# Patient Record
Sex: Female | Born: 1940
Health system: Southern US, Community
[De-identification: ages and names within clinical notes are randomized; demographics above are authoritative.]

## PROBLEM LIST (undated history)

## (undated) DIAGNOSIS — I739 Peripheral vascular disease, unspecified: Secondary | ICD-10-CM

## (undated) DIAGNOSIS — E669 Obesity, unspecified: Secondary | ICD-10-CM

## (undated) DIAGNOSIS — R0600 Dyspnea, unspecified: Secondary | ICD-10-CM

## (undated) DIAGNOSIS — N183 Chronic kidney disease, stage 3 unspecified: Secondary | ICD-10-CM

## (undated) DIAGNOSIS — Z9981 Dependence on supplemental oxygen: Secondary | ICD-10-CM

## (undated) DIAGNOSIS — G8929 Other chronic pain: Secondary | ICD-10-CM

## (undated) DIAGNOSIS — I771 Stricture of artery: Secondary | ICD-10-CM

## (undated) DIAGNOSIS — M1990 Unspecified osteoarthritis, unspecified site: Secondary | ICD-10-CM

## (undated) DIAGNOSIS — G2581 Restless legs syndrome: Secondary | ICD-10-CM

## (undated) DIAGNOSIS — F419 Anxiety disorder, unspecified: Secondary | ICD-10-CM

## (undated) DIAGNOSIS — G4733 Obstructive sleep apnea (adult) (pediatric): Secondary | ICD-10-CM

## (undated) DIAGNOSIS — F32A Depression, unspecified: Secondary | ICD-10-CM

## (undated) DIAGNOSIS — E039 Hypothyroidism, unspecified: Secondary | ICD-10-CM

## (undated) DIAGNOSIS — J449 Chronic obstructive pulmonary disease, unspecified: Secondary | ICD-10-CM

## (undated) DIAGNOSIS — Z5189 Encounter for other specified aftercare: Secondary | ICD-10-CM

## (undated) DIAGNOSIS — E785 Hyperlipidemia, unspecified: Secondary | ICD-10-CM

## (undated) DIAGNOSIS — I82409 Acute embolism and thrombosis of unspecified deep veins of unspecified lower extremity: Secondary | ICD-10-CM

## (undated) DIAGNOSIS — I779 Disorder of arteries and arterioles, unspecified: Secondary | ICD-10-CM

## (undated) DIAGNOSIS — E559 Vitamin D deficiency, unspecified: Secondary | ICD-10-CM

## (undated) DIAGNOSIS — M109 Gout, unspecified: Secondary | ICD-10-CM

## (undated) DIAGNOSIS — J189 Pneumonia, unspecified organism: Secondary | ICD-10-CM

## (undated) DIAGNOSIS — G4730 Sleep apnea, unspecified: Secondary | ICD-10-CM

## (undated) DIAGNOSIS — I5032 Chronic diastolic (congestive) heart failure: Secondary | ICD-10-CM

## (undated) DIAGNOSIS — I2510 Atherosclerotic heart disease of native coronary artery without angina pectoris: Secondary | ICD-10-CM

## (undated) DIAGNOSIS — I2089 Other forms of angina pectoris: Secondary | ICD-10-CM

## (undated) DIAGNOSIS — K7460 Unspecified cirrhosis of liver: Secondary | ICD-10-CM

## (undated) DIAGNOSIS — M51369 Other intervertebral disc degeneration, lumbar region without mention of lumbar back pain or lower extremity pain: Secondary | ICD-10-CM

## (undated) DIAGNOSIS — M545 Low back pain, unspecified: Secondary | ICD-10-CM

## (undated) DIAGNOSIS — I509 Heart failure, unspecified: Secondary | ICD-10-CM

## (undated) DIAGNOSIS — M2550 Pain in unspecified joint: Secondary | ICD-10-CM

## (undated) DIAGNOSIS — M5136 Other intervertebral disc degeneration, lumbar region: Secondary | ICD-10-CM

## (undated) DIAGNOSIS — D649 Anemia, unspecified: Secondary | ICD-10-CM

## (undated) DIAGNOSIS — I10 Essential (primary) hypertension: Secondary | ICD-10-CM

## (undated) DIAGNOSIS — E876 Hypokalemia: Secondary | ICD-10-CM

## (undated) DIAGNOSIS — R0902 Hypoxemia: Secondary | ICD-10-CM

## (undated) DIAGNOSIS — K219 Gastro-esophageal reflux disease without esophagitis: Secondary | ICD-10-CM

## (undated) DIAGNOSIS — E0590 Thyrotoxicosis, unspecified without thyrotoxic crisis or storm: Secondary | ICD-10-CM

## (undated) DIAGNOSIS — I499 Cardiac arrhythmia, unspecified: Secondary | ICD-10-CM

## (undated) DIAGNOSIS — F329 Major depressive disorder, single episode, unspecified: Secondary | ICD-10-CM

## (undated) DIAGNOSIS — M810 Age-related osteoporosis without current pathological fracture: Secondary | ICD-10-CM

## (undated) DIAGNOSIS — T502X5A Adverse effect of carbonic-anhydrase inhibitors, benzothiadiazides and other diuretics, initial encounter: Secondary | ICD-10-CM

## (undated) DIAGNOSIS — J9610 Chronic respiratory failure, unspecified whether with hypoxia or hypercapnia: Secondary | ICD-10-CM

## (undated) DIAGNOSIS — I208 Other forms of angina pectoris: Secondary | ICD-10-CM

## (undated) HISTORY — DX: Chronic kidney disease, stage 3 unspecified: N18.30

## (undated) HISTORY — DX: Dependence on supplemental oxygen: Z99.81

## (undated) HISTORY — DX: Chronic respiratory failure, unspecified whether with hypoxia or hypercapnia: J96.10

## (undated) HISTORY — DX: Obstructive sleep apnea (adult) (pediatric): G47.33

## (undated) HISTORY — DX: Hypoxemia: R09.02

## (undated) HISTORY — DX: Vitamin D deficiency, unspecified: E55.9

## (undated) HISTORY — DX: Chronic diastolic (congestive) heart failure: I50.32

## (undated) HISTORY — DX: Unspecified osteoarthritis, unspecified site: M19.90

## (undated) HISTORY — DX: Hyperlipidemia, unspecified: E78.5

## (undated) HISTORY — DX: Acute embolism and thrombosis of unspecified deep veins of unspecified lower extremity: I82.409

## (undated) HISTORY — DX: Unspecified cirrhosis of liver: K74.60

## (undated) HISTORY — PX: JOINT REPLACEMENT: SHX530

## (undated) HISTORY — DX: Hypothyroidism, unspecified: E03.9

## (undated) HISTORY — DX: Other intervertebral disc degeneration, lumbar region without mention of lumbar back pain or lower extremity pain: M51.369

## (undated) HISTORY — DX: Pain in unspecified joint: M25.50

## (undated) HISTORY — PX: LAPAROSCOPIC CHOLECYSTECTOMY: SUR755

## (undated) HISTORY — DX: Stricture of artery: I77.1

## (undated) HISTORY — DX: Restless legs syndrome: G25.81

## (undated) HISTORY — DX: Other forms of angina pectoris: I20.8

## (undated) HISTORY — DX: Age-related osteoporosis without current pathological fracture: M81.0

## (undated) HISTORY — DX: Other intervertebral disc degeneration, lumbar region: M51.36

## (undated) HISTORY — DX: Other forms of angina pectoris: I20.89

## (undated) HISTORY — PX: TONSILLECTOMY: SUR1361

## (undated) HISTORY — DX: Obesity, unspecified: E66.9

## (undated) HISTORY — DX: Thyrotoxicosis, unspecified without thyrotoxic crisis or storm: E05.90

## (undated) HISTORY — PX: AUGMENTATION MAMMAPLASTY: SUR837

---

## 1984-12-22 HISTORY — PX: BACK SURGERY: SHX140

## 2002-12-22 HISTORY — PX: GROIN MASS OPEN BIOPSY: SHX1714

## 2002-12-22 HISTORY — PX: ANKLE SURGERY: SHX546

## 2002-12-22 HISTORY — PX: CORONARY ARTERY BYPASS GRAFT: SHX141

## 2003-03-04 ENCOUNTER — Inpatient Hospital Stay (HOSPITAL_COMMUNITY): Admission: AD | Admit: 2003-03-04 | Discharge: 2003-03-14 | Payer: Self-pay | Admitting: Cardiology

## 2003-03-05 ENCOUNTER — Encounter: Payer: Self-pay | Admitting: Cardiology

## 2003-03-09 ENCOUNTER — Encounter: Payer: Self-pay | Admitting: Cardiothoracic Surgery

## 2003-03-10 ENCOUNTER — Encounter: Payer: Self-pay | Admitting: Cardiothoracic Surgery

## 2003-03-11 ENCOUNTER — Encounter: Payer: Self-pay | Admitting: Cardiothoracic Surgery

## 2003-03-12 ENCOUNTER — Encounter: Payer: Self-pay | Admitting: Cardiothoracic Surgery

## 2004-12-22 HISTORY — PX: CATARACT EXTRACTION W/ INTRAOCULAR LENS  IMPLANT, BILATERAL: SHX1307

## 2005-12-22 HISTORY — PX: TOTAL HIP ARTHROPLASTY: SHX124

## 2007-11-22 HISTORY — PX: ANTERIOR CERVICAL DECOMP/DISCECTOMY FUSION: SHX1161

## 2007-12-20 ENCOUNTER — Inpatient Hospital Stay (HOSPITAL_COMMUNITY): Admission: RE | Admit: 2007-12-20 | Discharge: 2007-12-21 | Payer: Self-pay | Admitting: Neurosurgery

## 2007-12-23 DIAGNOSIS — E669 Obesity, unspecified: Secondary | ICD-10-CM

## 2007-12-23 HISTORY — DX: Obesity, unspecified: E66.9

## 2008-03-07 ENCOUNTER — Encounter: Admission: RE | Admit: 2008-03-07 | Discharge: 2008-03-07 | Payer: Self-pay | Admitting: Neurosurgery

## 2008-03-07 ENCOUNTER — Encounter: Payer: Self-pay | Admitting: Internal Medicine

## 2008-04-12 ENCOUNTER — Ambulatory Visit: Payer: Self-pay | Admitting: Internal Medicine

## 2008-04-12 DIAGNOSIS — R0609 Other forms of dyspnea: Secondary | ICD-10-CM | POA: Insufficient documentation

## 2008-04-12 DIAGNOSIS — J449 Chronic obstructive pulmonary disease, unspecified: Secondary | ICD-10-CM | POA: Insufficient documentation

## 2008-04-12 DIAGNOSIS — R0602 Shortness of breath: Secondary | ICD-10-CM | POA: Insufficient documentation

## 2008-04-12 DIAGNOSIS — R0989 Other specified symptoms and signs involving the circulatory and respiratory systems: Secondary | ICD-10-CM | POA: Insufficient documentation

## 2008-04-18 ENCOUNTER — Encounter: Admission: RE | Admit: 2008-04-18 | Discharge: 2008-04-18 | Payer: Self-pay | Admitting: Neurosurgery

## 2008-04-18 ENCOUNTER — Encounter: Payer: Self-pay | Admitting: Internal Medicine

## 2008-04-26 ENCOUNTER — Telehealth: Payer: Self-pay | Admitting: Internal Medicine

## 2008-05-03 ENCOUNTER — Encounter: Payer: Self-pay | Admitting: Internal Medicine

## 2008-05-03 ENCOUNTER — Ambulatory Visit: Payer: Self-pay | Admitting: Pulmonary Disease

## 2008-05-03 DIAGNOSIS — G4730 Sleep apnea, unspecified: Secondary | ICD-10-CM | POA: Insufficient documentation

## 2008-05-03 DIAGNOSIS — J438 Other emphysema: Secondary | ICD-10-CM | POA: Insufficient documentation

## 2008-05-03 DIAGNOSIS — I10 Essential (primary) hypertension: Secondary | ICD-10-CM | POA: Insufficient documentation

## 2008-05-03 DIAGNOSIS — E039 Hypothyroidism, unspecified: Secondary | ICD-10-CM | POA: Insufficient documentation

## 2008-06-09 ENCOUNTER — Ambulatory Visit (HOSPITAL_COMMUNITY): Admission: RE | Admit: 2008-06-09 | Discharge: 2008-06-10 | Payer: Self-pay | Admitting: Neurosurgery

## 2008-07-06 ENCOUNTER — Encounter: Admission: RE | Admit: 2008-07-06 | Discharge: 2008-07-06 | Payer: Self-pay | Admitting: Neurosurgery

## 2008-08-10 ENCOUNTER — Encounter: Admission: RE | Admit: 2008-08-10 | Discharge: 2008-08-10 | Payer: Self-pay | Admitting: Neurosurgery

## 2008-10-09 ENCOUNTER — Inpatient Hospital Stay (HOSPITAL_COMMUNITY): Admission: RE | Admit: 2008-10-09 | Discharge: 2008-10-14 | Payer: Self-pay | Admitting: Neurosurgery

## 2008-11-14 ENCOUNTER — Encounter: Admission: RE | Admit: 2008-11-14 | Discharge: 2008-11-14 | Payer: Self-pay | Admitting: Neurosurgery

## 2008-11-23 ENCOUNTER — Ambulatory Visit: Payer: Self-pay | Admitting: Family Medicine

## 2008-11-23 ENCOUNTER — Inpatient Hospital Stay (HOSPITAL_COMMUNITY): Admission: EM | Admit: 2008-11-23 | Discharge: 2008-11-24 | Payer: Self-pay | Admitting: Emergency Medicine

## 2008-11-23 ENCOUNTER — Encounter (INDEPENDENT_AMBULATORY_CARE_PROVIDER_SITE_OTHER): Payer: Self-pay | Admitting: Emergency Medicine

## 2008-11-23 ENCOUNTER — Ambulatory Visit: Payer: Self-pay | Admitting: Surgery

## 2008-11-23 ENCOUNTER — Encounter: Admission: RE | Admit: 2008-11-23 | Discharge: 2008-11-23 | Payer: Self-pay | Admitting: Neurosurgery

## 2008-12-01 ENCOUNTER — Encounter: Admission: RE | Admit: 2008-12-01 | Discharge: 2008-12-01 | Payer: Self-pay | Admitting: Neurosurgery

## 2008-12-26 ENCOUNTER — Encounter: Admission: RE | Admit: 2008-12-26 | Discharge: 2008-12-26 | Payer: Self-pay | Admitting: Neurosurgery

## 2008-12-27 ENCOUNTER — Telehealth: Payer: Self-pay | Admitting: Family Medicine

## 2008-12-27 DIAGNOSIS — I82409 Acute embolism and thrombosis of unspecified deep veins of unspecified lower extremity: Secondary | ICD-10-CM | POA: Insufficient documentation

## 2009-01-23 ENCOUNTER — Encounter: Payer: Self-pay | Admitting: Neurosurgery

## 2009-02-23 ENCOUNTER — Encounter: Admission: RE | Admit: 2009-02-23 | Discharge: 2009-02-23 | Payer: Self-pay | Admitting: Neurosurgery

## 2009-03-08 ENCOUNTER — Encounter: Admission: RE | Admit: 2009-03-08 | Discharge: 2009-03-08 | Payer: Self-pay | Admitting: Neurosurgery

## 2009-05-29 ENCOUNTER — Encounter: Admission: RE | Admit: 2009-05-29 | Discharge: 2009-05-29 | Payer: Self-pay | Admitting: Neurosurgery

## 2009-08-01 ENCOUNTER — Encounter: Admission: RE | Admit: 2009-08-01 | Discharge: 2009-08-01 | Payer: Self-pay | Admitting: Neurosurgery

## 2009-08-20 ENCOUNTER — Ambulatory Visit: Payer: Self-pay | Admitting: Internal Medicine

## 2009-08-20 ENCOUNTER — Inpatient Hospital Stay (HOSPITAL_COMMUNITY): Admission: RE | Admit: 2009-08-20 | Discharge: 2009-08-23 | Payer: Self-pay | Admitting: Neurosurgery

## 2009-08-22 ENCOUNTER — Encounter (INDEPENDENT_AMBULATORY_CARE_PROVIDER_SITE_OTHER): Payer: Self-pay | Admitting: Neurosurgery

## 2009-11-21 HISTORY — PX: CARDIAC CATHETERIZATION: SHX172

## 2009-11-21 HISTORY — PX: CORONARY ANGIOPLASTY WITH STENT PLACEMENT: SHX49

## 2009-12-22 DIAGNOSIS — Z5189 Encounter for other specified aftercare: Secondary | ICD-10-CM

## 2009-12-22 HISTORY — DX: Encounter for other specified aftercare: Z51.89

## 2010-01-10 ENCOUNTER — Encounter: Admission: RE | Admit: 2010-01-10 | Discharge: 2010-01-10 | Payer: Self-pay | Admitting: Neurosurgery

## 2010-01-14 ENCOUNTER — Encounter: Admission: RE | Admit: 2010-01-14 | Discharge: 2010-01-14 | Payer: Self-pay | Admitting: Neurosurgery

## 2010-07-02 ENCOUNTER — Ambulatory Visit: Payer: Self-pay | Admitting: Cardiovascular Disease

## 2010-07-08 ENCOUNTER — Telehealth: Payer: Self-pay | Admitting: Cardiovascular Disease

## 2010-08-13 ENCOUNTER — Ambulatory Visit: Payer: Self-pay | Admitting: Cardiovascular Disease

## 2010-11-04 ENCOUNTER — Ambulatory Visit: Payer: Self-pay | Admitting: Cardiovascular Disease

## 2010-12-22 DIAGNOSIS — G4730 Sleep apnea, unspecified: Secondary | ICD-10-CM

## 2010-12-22 HISTORY — DX: Sleep apnea, unspecified: G47.30

## 2010-12-22 HISTORY — PX: REPLACEMENT TOTAL KNEE BILATERAL: SUR1225

## 2011-01-16 ENCOUNTER — Ambulatory Visit
Admission: RE | Admit: 2011-01-16 | Discharge: 2011-01-16 | Payer: Self-pay | Source: Home / Self Care | Attending: Cardiovascular Disease | Admitting: Cardiovascular Disease

## 2011-01-17 NOTE — Assessment & Plan Note (Addendum)
[facility]                       [place] CARDIOLOGY OFFICE NOTE  NAME:Klingberg, KALIOPE QUINONEZ                       MRN:          [id number] DATE:01/16/2011                            DOB:          05/18/1941   [name] is a 70 year old female who is here today for a followup visit and regarding preoperative cardiovascular evaluation for knee replacement.  The patient has the following problem list:  1. Coronary artery disease status post coronary artery bypass graft     surgery in 2004 at [facility].  Most recent cardiac     catheterization was done in December 2010, which showed patent left     internal mammary artery graft to left anterior descending coronary     artery, patent saphenous vein graft to obtuse marginal 1, and     occluded saphenous vein graft to first diagonal and ramus.  There     was 90% ostial left main stenosis.  She underwent angioplasty and     drug-eluting stent placement with 4.0 x 12 mm Ion stent.  There was     residual 60-70% proximal stenosis in the circumflex which was left     to be treated medically.  Ejection fraction was 65%. 2. History of deep vein thrombosis in the left leg.  She also had deep     vein thrombosis in the right leg, diagnosed in November 2011.  She     is currently on lifelong anticoagulation with warfarin. 3. Peripheral vascular disease with bilateral moderate carotid artery     stenosis. 4. Chronic obstructive pulmonary disease. 5. Previous tobacco use, quit in 2004. 6. Hypertension. 7. Hyperlipidemia. 8. Severe osteoarthritis.  INTERVAL HISTORY:  Ms. Blow was last seen by me on November 04, 2010. At that time, she was diagnosed with acute DVT in her right popliteal and peroneal veins.  She was started on anticoagulation with subcutaneous Lovenox as well as warfarin.  She has been taking warfarin since then without any complications.  The swelling and discomfort in her right calf  resolved after about 3 weeks of treatment.  She has not had any recurrent swelling in her lower extremities.  She continues to have severe limitations and physical activities due to significant degenerative joint disease in both knees.  This is interfering with her activities of daily living and has significant impact on her quality of life.  She has not had any chest pain, dyspnea, palpitations, or syncope.  She does not have any cardiac symptoms at this time.  MEDICATIONS: 1. Imdur 120 mg once daily. 2. Lisinopril 20 mg once daily. 3. Cyclobenzaprine 10 mg 3 times a day. 4. Amlodipine 5 mg once daily. 5. Levothyroxine 150 mcg once daily. 6. Omeprazole 40 mg once daily. 7. Citalopram 20 mg 3 tablets in the morning. 8. Furosemide 40 mg twice daily. 9. Warfarin 5 mg at bedtime. 10.Aspirin 81 mg once daily. 11.Ambien 5 mg at bedtime as needed.  ALLERGIES:  Include PENICILLIN.  She is also intolerant to STATINS due to myalgia.  PHYSICAL EXAMINATION:  GENERAL:  The patient appears to be at her stated age,  in no acute distress. VITAL SIGNS:  Weight is 214.4 pounds, blood pressure is 112/69, pulse is 94, oxygen saturation is 93% on room air. HEENT:  Normocephalic, atraumatic. NECK:  No JVD or carotid bruits. LUNGS:  Clear to auscultation with normal respiratory effort. CARDIOVASCULAR:  Normal PMI.  Normal S1 and S2 with no gallops or murmurs. ABDOMEN:  Benign, nontender, nondistended. EXTREMITIES:  With trace edema bilaterally.  There is no clubbing or cyanosis. SKIN:  Warm and dry with no rash. PSYCHIATRIC:  She is alert and oriented x3 with normal mood and affect.  An electrocardiogram was performed which showed normal sinus rhythm, left anterior fascicular block, and borderline criteria for left ventricular hypertrophy.  There are no significant ST or T-wave changes.  IMPRESSION: 1. Preoperative cardiovascular evaluation for a planned left knee     replacement.  The patient  does have a cardiac history including     coronary artery bypass graft surgery as well as a stent placement     in December 2010.  Since then, the patient has not had any     recurrent symptoms of angina and has been doing well.  Her     electrocardiogram does not show any acute changes.  Thus, I do not     see need for further cardiac testing at this time.  She can undergo     the planned surgery at a low-to-moderate risk from a cardiac     standpoint.  The second issue is that she had acute deep vein     thrombosis on November 04, 2010.  This symptoms of deep vein     thrombosis has resolved.  As I recommended before, 3 months of     anticoagulation would be enough before she can have her knee     replacement.  Thus, I think it would be safe to perform her surgery     after the second half of February 2012.  At that time, warfarin     treatment can be interrupted for 5-7 days as deemed necessary.     Postoperatively, I recommend resuming warfarin if there are no     bleeding issues.  Due to recurrent history of deep vein thromboses,     I recommended lifelong treatment with warfarin.  She will also     require aggressive deep vein thrombosis prophylaxis throughout her     hospital course as per protocol.  Due to her cardiac history and     previous stent, I recommended continuing aspirin 81 mg once daily.     This should not be stopped preoperatively. 2. Coronary artery disease.  She is stable with no symptoms of angina.     We will continue with current medical therapy.  Unfortunately, she     is intolerant to STATINS due to myalgia. 3. History of recurrent deep vein thromboses in the lower extremities.     Lifelong anticoagulation is recommended.  However, as mentioned     above, her anticoagulation can     be interrupted after February 04, 2011 for the planned surgery     without the need for bridging.    Lorine Bears, MD Electronically Signed   MA/MedQ  DD: 01/16/2011  DT:  01/17/2011  Job #: [id number]  cc:   Dr. Halina Maidens Fedder

## 2011-01-21 NOTE — Progress Notes (Signed)
Summary: Home care certification.     New Problems: DEEP VENOUS THROMBOPHLEBITIS (ICD-453.40)   Additional Follow-up for Phone Call Additional follow up Details #2::    rec'd call from Jan with [facility]. she has a POT for this pt & needs it signed. I do not show her as being a pt here. we did see her in hospital. message to Drs. Andria Rhein for clarification Follow-up by: Golden Circle RN,  December 27, 2008 10:09 AM  Additional Follow-up for Phone Call Additional follow up Details #3:: Details for Additional Follow-up Action Taken: Hospital patient 12/3-4 for DVT. Form signed and returned to Kennon Rounds to fax  New Problems: DEEP VENOUS THROMBOPHLEBITIS (ICD-453.40)

## 2011-01-21 NOTE — Assessment & Plan Note (Signed)
Summary: sleep apnea////kp   Visit Type:  Initial Consult Referred by:  Dr. Marchelle Gearing PCP:  Dr. Polly Cobia in [place], [place]  Chief Complaint:  shortness of breath - clearance for surgery - does not sleep well .  History of Present Illness: Initial visit for sleep evaln - also needs surgical clearance for C-spine surgery. Apparently this was not discussed during meeting with Dr. Marchelle Gearing. DOE since CABG 2 yrs ago, desaturatde to 92% on exertion with HR increasing to 140 s/o deconditioning. Spiriva made her mouth dry & lose her taste - hence stopped. ESS 4/24. Loud snoring , they sleep in different rooms. 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 c/o nocturia , takes pm dose of lasix at 3pm, neck pain has been an issue. Frequent arousals, non refreshing sleep, Increased sleep latency - naps on weekends.    History of Present Illness: "years"  What time do you typically go to bed?(between what hours): 11 - 12  How long does it take you to fall asleep? varies  How many times during the night do you wake up? at least every 1 1/2 hours  What time do you get out of bed to start your day? 6 a.m.  Do you drive or operate heavy machinery in your occupation? no  How much has your weight changed (up or down) over the past two years? (in pounds): 45 lbs increased  Have you ever had a sleep study before?  If yes,when and where: no  Do you currently use CPAP ? If so , at what pressure? no  Do you wear oxygen at any time? If yes, how many liters per minute? oxygen at 2L at bedtime    Current Allergies: ! PENICILLIN  Past Medical History:    Reviewed history from 04/12/2008 and no changes required:       CAD x s/p CABG 2004       Hypertension       COPD/Emphysema x dxed by Dr. Sherril Croon in [place] based on PFTs per patient in 2006. Placed on abluterol       Arthralgia NOS       Obesity BMI 33 in 2009       Current Problems:        HYPERTHYROIDISM (ICD-242.90)    HYPERTENSION (ICD-401.9)       EMPHYSEMA (ICD-492.8)       SNORING (ICD-786.09)       C O P D (ICD-496)       SHORTNESS OF BREATH (SOB) (ICD-786.05)         Past Surgical History:    Reviewed history from 04/12/2008 and no changes required:       neck surgery 11/2007. due for repeat neck surgery       Back surgery scheduled for later 2009       heart 4-bypass       breast implant       Right hip replacement x 2007              Uneventful surgeries   Family History:    Reviewed history from 04/12/2008 and no changes required:       cancer-mother-ovarian       cancer-father-lung  Social History:    Reviewed history from 04/12/2008 and no changes required:       married 52 years and lives with husband       3 grown children  retired       International aid/development worker  Current Meds:  CYCLOBENZAPRINE HCL 10 MG  TABS (CYCLOBENZAPRINE HCL) three times a day ISOSORBIDE MONONITRATE CR 60 MG  TB24 (ISOSORBIDE MONONITRATE) once daily LEVOTHROID 137 MCG  TABS (LEVOTHYROXINE SODIUM) once daily PRAVASTATIN SODIUM 80 MG  TABS (PRAVASTATIN SODIUM) at bedtime ALPRAZOLAM 0.5 MG  TABS (ALPRAZOLAM) at bedtime LISINOPRIL 20 MG  TABS (LISINOPRIL) once daily FUROSEMIDE 80 MG  TABS (FUROSEMIDE) two times a day BUDEPRION SR 150 MG  TB12 (BUPROPION HCL) two times a day KLOR-CON M10 10 MEQ  TBCR (POTASSIUM CHLORIDE CRYS CR) once daily AMLODIPINE BESYLATE 5 MG  TABS (AMLODIPINE BESYLATE) once daily CITALOPRAM HYDROBROMIDE 40 MG  TABS (CITALOPRAM HYDROBROMIDE) once daily BAYER ASPIRIN EC LOW DOSE 81 MG  TBEC (ASPIRIN) once daily PROAIR HFA 108 (90 BASE) MCG/ACT  AERS (ALBUTEROL SULFATE) as needed PILOCARPINE HCL 0.5 %  SOLN (PILOCARPINE HCL) two times a day SPIRIVA HANDIHALER 18 MCG  CAPS (TIOTROPIUM BROMIDE MONOHYDRATE) one puffs in handihaler daily    Review of Systems       The patient complains of dyspnea on exhertion.  The patient denies anorexia, fever, weight loss, weight gain, vision loss,  decreased hearing, hoarseness, chest pain, syncope, peripheral edema, prolonged cough, hemoptysis, abdominal pain, melena, hematochezia, severe indigestion/heartburn, hematuria, incontinence, genital sores, muscle weakness, suspicious skin lesions, transient blindness, difficulty walking, depression, unusual weight change, abnormal bleeding, enlarged lymph nodes, angioedema, breast masses, and testicular masses.     Vital Signs:  Patient Profile:   70 Years Old Female Height:     69 inches Weight:      220.50 pounds O2 Sat:      95 % O2 treatment:    Room Air Pulse rate:   90 / minute BP sitting:   118 / 58  (left arm)  Vitals Entered By: Marinus Maw (May 03, 2008 2:02 PM)             Comments Medications reviewed with patient Marinus Maw  May 03, 2008 2:05 PM      Physical Exam  General:     HEENT - no thrush, no post nasal drip No JVD, no lymphadenopathy CVS- s1s2 nml, no murmur RS- clear, no crackles or rhonchi Abd- soft, non-tender, no organomegaly CNS- non focal Ext - no edema  Mouth:     Melampatti Class III.       Impression & Recommendations:  Problem # 1:  C O P D (ICD-496) Spirometry showed mild airway obstruction FEV1 85% , ratio 67, smaller airways 44% c/w remote smoking. Do not feel the need for spiriva here. she will use albuterol on  as needed basis - no contra-indication to cervical spine surgery.    Problem # 2:  SHORTNESS OF BREATH (SOB) (ICD-786.05) Main reason appears to e severe deconditioning & she would certainl benefit from an exercise / rehab program either before or after surgery (dependig o th eurgency of this surgery)  Problem # 3:  OBSTRUCTIVE SLEEP APNEA (ICD-780.57) Likley based on non refreshing sleep, loud snoring & pharyngeal exam. Will pursue sleep study in future. Would use CPAP empirically when she is waking up form anesthesia & during hospital stay with nasal mask at 10cm. The pathophysiology of obstructive  sleep apnea, it's cardiovascular consequences and modes of treatment including CPAP were discussed with the patient in great detail.    Patient Instructions: 1)  Please schedule a follow-up appointment in 2 months. 2)  OK to proceed with  neck surgery - we will send letter to Dr. Wynetta Emery. 3)  Call us after recovered from surgery to schedule sleep study   ]

## 2011-01-21 NOTE — Consult Note (Signed)
Summary: Office Note/[facility]  Office Note/[facility]   Imported By: Briant Cedar 05/08/2008 10:35:43  _____________________________________________________________________  External Attachment:    Type:   Image     Comment:   External Document

## 2011-01-21 NOTE — Assessment & Plan Note (Signed)
Summary: BREATHING PROBLEM/ MBW   Visit Type:  Initial Consult Referred by:  self referred PCP:  Dr. Polly Cobia in [place], [place]  Chief Complaint:  pulmonary consult.........SOB w/ exertion....Marland Kitchenreviewed meds......  History of Present Illness: 70 year old female c/p dyspnea.  #Dyspnea since CABG in 2004. Noticed dyspnea within 2 weeks of CA BG. Progressive since onset; more progressive since 1 year. Brought on by exertion. Relieved by rest. Activities such as walking around the house, climbing 1 flight of stairs, 50 feet, changing clothes, taking shower make her dyspneic.   Prior to CABG did not have dyspnea but had chest pain. Workup then showed CAD and then underwent CABG at [facility] in 2004 by Dr. Morton Peters. She did NOT attend cardiac rehab post CABG but interestingly has not had chest pain post CABG. Also, in 2006 was diagnosed to have COPD and placed on albuerol prn but this has not helped with dyspnea. Has had neck surgery in 2008 and hip surgery 2007 under [place] without problems. And, as recently as 01/2008 was told by her local cardiologist that dyspnea did not emanate from heart issues  #Associated wheezing with dyspnea present for same time. Associated orthopnea also present x 4 months (sleeps in recliner)  #No wheeze during rest. No chest pain. No hemoptysis. No edema. No PND. No cough.     Updated Prior Medication List: CYCLOBENZAPRINE HCL 10 MG  TABS (CYCLOBENZAPRINE HCL) three times a day ISOSORBIDE MONONITRATE CR 60 MG  TB24 (ISOSORBIDE MONONITRATE) once daily LEVOTHROID 137 MCG  TABS (LEVOTHYROXINE SODIUM) once daily PRAVASTATIN SODIUM 80 MG  TABS (PRAVASTATIN SODIUM) at bedtime ALPRAZOLAM 0.5 MG  TABS (ALPRAZOLAM) at bedtime LISINOPRIL 20 MG  TABS (LISINOPRIL) once daily FUROSEMIDE 80 MG  TABS (FUROSEMIDE) two times a day BUDEPRION SR 150 MG  TB12 (BUPROPION HCL) two times a day KLOR-CON M10 10 MEQ  TBCR (POTASSIUM CHLORIDE CRYS CR) once daily AMLODIPINE BESYLATE 5 MG   TABS (AMLODIPINE BESYLATE) once daily CITALOPRAM HYDROBROMIDE 40 MG  TABS (CITALOPRAM HYDROBROMIDE) once daily BAYER ASPIRIN EC LOW DOSE 81 MG  TBEC (ASPIRIN) once daily PROAIR HFA 108 (90 BASE) MCG/ACT  AERS (ALBUTEROL SULFATE) as needed PILOCARPINE HCL 0.5 %  SOLN (PILOCARPINE HCL) two times a day  Current Allergies (reviewed today): ! PENICILLIN  Past Medical History:    CAD x s/p CABG 2004    Hypertension    COPD/Emphysema x dxed by Dr. Sherril Croon in [place] based on PFTs per patient in 2006. Placed on abluterol    Arthralgia NOS    Obesity BMI 33 in 2009    Hypothyroidisom  Past Surgical History:    neck surgery 11/2007. due for repeat neck surgery    Backk surgery scheduled for later 2009    heart 4-bypass    breast implant    Right hip replacement x 2007        Uneventful surgeries   Family History:    cancer-mother-ovarian    cancer-father-lung  Social History:    married 52 years and lives with husband    3 grown children    retired    International aid/development worker   Risk Factors:  Tobacco use:  quit    Year quit:  2004    Pack-years:  45 1 1/2 packs Alcohol use:  no   Review of Systems       Occ bad GERD Clears throat al the time NOS x many years Snores x years Easy Excess Day time somnolence x years  Vital Signs:  Patient Profile:   70 Years Old Female Height:     69 inches Weight:      223 pounds BMI:     33.05 O2 Sat:      95 % O2 treatment:    Room Air Temp:     98.9 degrees F oral Pulse rate:   88 / minute BP sitting:   130 / 72  (left arm) Cuff size:   regular  Pt. in pain?   no  Vitals Entered By: Clarise Cruz Duncan Dull) (April 12, 2008 9:30 AM)                  Physical Exam  General:     well developed, well nourished, in no acute distressobese.   Head:     normocephalic and atraumatic Eyes:     PERRLA/EOM intact; conjunctiva and sclera clear Ears:     TMs intact and clear with normal canals Nose:     no deformity, discharge,  inflammation, or lesions Mouth:     no deformity or lesions. Dentures. Melampatti Class I.   Neck:     no masses, thyromegaly, or abnormal cervical nodes Chest Wall:     no deformities noted Lungs:     clear bilaterally to auscultation and percussion Heart:     regular rate and rhythm, S1, S2 without murmurs, rubs, gallops, or clicks Abdomen:     bowel sounds positive; abdomen soft and non-tender without masses, or organomegaly Msk:     no deformity or scoliosis noted with normal posture Pulses:     pulses normal Extremities:     no clubbing, cyanosis, edema, or deformity noted Neurologic:     CN II-XII grossly intact with normal reflexes, coordination, muscle strength and tone Skin:     intact without lesions or rashes Cervical Nodes:     no significant adenopathy Psych:     alert and cooperative; normal mood and affect; normal attention span and concentration   CXR  Procedure date:  12/20/2007  Findings:      Personally reviewed  linical Data:  70 year-old female, pre-op HNP.   CHEST - 2 VIEW - 12/20/2007:   Findings:  Patient is status-post median sternotomy for CABG.  Heart   size is normal.  Calcified breast implants are noted bilaterally.   Mild atelectasis or scarring at the left base is noted.  The upper   lung fields are clear.   IMPRESSION:   1. Mild scarring or atelectasis at the left base.   2. Status-post CABG.   3. No acute cardiopulmonary disease.   The findings were communicated to Short-Stay at the time of study.    Read By:  Chauncey Fischer,  MD   Released By:  Chauncey Fischer,  MD     Impression & Recommendations:  Problem # 1:  SHORTNESS OF BREATH (SOB) (ICD-786.05) Assessment: New Chronic Dyspnea with Current ET at Class 3. Previous smoker. Dyspnea started after CABG and she did not undergo rehab. Carries underlying diagnosis of COPD but PFT done outside is not available. Also, not on spiriva treatment for COPD and never  attended rehab.  Deconditioning and Obesity likely contributing to dyspnea as well. Walking desat test today - stopped at 3 laps when HR rose to 144 and pox was 92% - suggesting deconditioning  PLAN Start spiriva Get Outside PFTs from [facility] done in 2007 Refer cardio-pulm rehab Orders: New Patient Level IV (11914) Pulse Oximetry, Ambulatory (78295)  Sleep Disorder Referral (Sleep Disorder) Rehabilitation Referral (Rehab)   Problem # 2:  C O P D (ICD-496) Assessment: New Get outside pft's start spiriva Her updated medication list for this problem includes:    Proair Hfa 108 (90 Base) Mcg/act Aers (Albuterol sulfate) .Marland Kitchen... As needed    Spiriva Handihaler 18 Mcg Caps (Tiotropium bromide monohydrate) ..... One puffs in handihaler daily  Orders: New Patient Level IV (16109) Pulse Oximetry, Ambulatory (60454) Sleep Disorder Referral (Sleep Disorder) Rehabilitation Referral (Rehab)   Problem # 3:  SNORING (ICD-786.09) Assessment: New Obsese. Snores. Exscess day time somnolence. Likely Sleep Apnea present  PLAN Refer Dr. Jeannetta Ellis for evaluation  Her updated medication list for this problem includes:    Lisinopril 20 Mg Tabs (Lisinopril) ..... Once daily    Furosemide 80 Mg Tabs (Furosemide) .Marland Kitchen..Marland Kitchen Two times a day    Proair Hfa 108 (90 Base) Mcg/act Aers (Albuterol sulfate) .Marland Kitchen... As needed    Spiriva Handihaler 18 Mcg Caps (Tiotropium bromide monohydrate) ..... One puffs in handihaler daily  Orders: Sleep Disorder Referral (Sleep Disorder) Rehabilitation Referral (Rehab)   Medications Added to Medication List This Visit: 1)  Cyclobenzaprine Hcl 10 Mg Tabs (Cyclobenzaprine hcl) .... Three times a day 2)  Isosorbide Mononitrate Cr 60 Mg Tb24 (Isosorbide mononitrate) .... Once daily 3)  Levothroid 137 Mcg Tabs (Levothyroxine sodium) .... Once daily 4)  Pravastatin Sodium 80 Mg Tabs (Pravastatin sodium) .... At bedtime 5)  Alprazolam 0.5 Mg Tabs (Alprazolam) .... At bedtime 6)   Lisinopril 20 Mg Tabs (Lisinopril) .... Once daily 7)  Furosemide 80 Mg Tabs (Furosemide) .... Two times a day 8)  Budeprion Sr 150 Mg Tb12 (Bupropion hcl) .... Two times a day 9)  Klor-con M10 10 Meq Tbcr (Potassium chloride crys cr) .... Once daily 10)  Amlodipine Besylate 5 Mg Tabs (Amlodipine besylate) .... Once daily 11)  Citalopram Hydrobromide 40 Mg Tabs (Citalopram hydrobromide) .... Once daily 12)  Bayer Aspirin Ec Low Dose 81 Mg Tbec (Aspirin) .... Once daily 13)  Proair Hfa 108 (90 Base) Mcg/act Aers (Albuterol sulfate) .... As needed 14)  Pilocarpine Hcl 0.5 % Soln (Pilocarpine hcl) .... Two times a day 15)  Spiriva Handihaler 18 Mcg Caps (Tiotropium bromide monohydrate) .... One puffs in handihaler daily    Patient Instructions: 1)  1. Please have [facility] fax pulmonary function test report and data to [phone] 2)  2. Pleast take spiriva inhaler as directed 3)  3. Please attend cardiopulm rehab at [facility] or [facility]. Get clearance from your cardiologist first 4)  4. Please visit Dr. Craige Cotta or Dr. Vassie Loll for Sleep [place] Evaluation    Prescriptions: SPIRIVA HANDIHALER 18 MCG  CAPS (TIOTROPIUM BROMIDE MONOHYDRATE) one puffs in handihaler daily  #1 x 6   Entered and Authorized by:   Kalman Shan MD   Signed by:   Kalman Shan MD on 04/12/2008   Method used:   Print then Give to Patient   RxID:   [id number]  ]  Appended Document: BREATHING PROBLEM/ MBW      Serial Vital Signs/Assessments:  Comments: Ambulatory Pulse Oximetry  Resting; HR_84____    02 Sat_95____  Lap1 (185 feet)   HR_114____   02 Sat__94___ Lap2 (185 feet)   HR_126____   02 Sat_93____    Lap3 (185 feet)   HR_144____   02 Sat_92____  ___Test Completed without Difficulty ___Test Stopped due to.............: Test completed with difficulty ..................................................................Marland KitchenClarise Cruz [facility])  April 12, 2008 11:35 AM   By: Lynnae Sandhoff  Ford,CMA (AAMA)      Current Allergies: ! PENICILLIN           ]

## 2011-01-21 NOTE — Medication Information (Signed)
Summary: O2/April Carlyon  O2/[facility]   Imported By: Briant Cedar 05/08/2008 08:44:31  _____________________________________________________________________  External Attachment:    Type:   Image     Comment:   External Document

## 2011-01-21 NOTE — Progress Notes (Signed)
  Phone Note Outgoing Call   Call placed by: Dessie Coma  LPN,  July 08, 2010 3:31 PM Call placed to: Patient Summary of Call: Patient notified per Dr. Kirke Corin, lab results were reviewed.Marland KitchenMarland KitchenNeed to decrease Lasix to 40mg  two times a day and start on Simvastatin 40mg  at bedtime for cholesterol.

## 2011-01-23 ENCOUNTER — Other Ambulatory Visit: Payer: Self-pay | Admitting: Neurosurgery

## 2011-01-23 DIAGNOSIS — M545 Low back pain, unspecified: Secondary | ICD-10-CM

## 2011-01-29 ENCOUNTER — Other Ambulatory Visit: Payer: Self-pay | Admitting: Neurosurgery

## 2011-01-29 ENCOUNTER — Ambulatory Visit
Admission: RE | Admit: 2011-01-29 | Discharge: 2011-01-29 | Disposition: A | Discharge status: Home / Self Care | Payer: Medicare Other | Source: Ambulatory Visit | Attending: Neurosurgery | Admitting: Neurosurgery

## 2011-01-29 DIAGNOSIS — M545 Low back pain, unspecified: Secondary | ICD-10-CM

## 2011-01-29 MED ORDER — GADOBENATE DIMEGLUMINE 529 MG/ML IV SOLN
20.0000 mL | Freq: Once | INTRAVENOUS | Status: AC | PRN
  Administered 2011-01-29: 20 mL via INTRAVENOUS

## 2011-02-06 ENCOUNTER — Ambulatory Visit
Admission: RE | Admit: 2011-02-06 | Discharge: 2011-02-06 | Disposition: A | Discharge status: Home / Self Care | Payer: Medicare Other | Source: Ambulatory Visit | Attending: Neurosurgery | Admitting: Neurosurgery

## 2011-02-06 ENCOUNTER — Other Ambulatory Visit: Payer: Self-pay | Admitting: Neurosurgery

## 2011-02-06 DIAGNOSIS — N289 Disorder of kidney and ureter, unspecified: Secondary | ICD-10-CM

## 2011-02-06 DIAGNOSIS — M549 Dorsalgia, unspecified: Secondary | ICD-10-CM

## 2011-02-06 MED ORDER — IOHEXOL 300 MG/ML  SOLN
125.0000 mL | Freq: Once | INTRAMUSCULAR | Status: AC | PRN
  Administered 2011-02-06: 125 mL via INTRAVENOUS

## 2011-03-06 ENCOUNTER — Ambulatory Visit: Payer: Self-pay | Admitting: Cardiovascular Disease

## 2011-03-14 ENCOUNTER — Encounter (HOSPITAL_COMMUNITY)
Admission: RE | Admit: 2011-03-14 | Discharge: 2011-03-14 | Disposition: A | Discharge status: Home / Self Care | Payer: Medicare Other | Source: Ambulatory Visit | Attending: Neurosurgery | Admitting: Neurosurgery

## 2011-03-14 DIAGNOSIS — Z01818 Encounter for other preprocedural examination: Secondary | ICD-10-CM | POA: Insufficient documentation

## 2011-03-14 LAB — CBC
HCT: 34.6 % — ABNORMAL LOW (ref 36.0–46.0)
Hemoglobin: 11.3 g/dL — ABNORMAL LOW (ref 12.0–15.0)
MCH: 28.8 pg (ref 26.0–34.0)
MCHC: 32.7 g/dL (ref 30.0–36.0)
MCV: 88.3 fL (ref 78.0–100.0)
Platelets: 333 10*3/uL (ref 150–400)
RBC: 3.92 MIL/uL (ref 3.87–5.11)
RDW: 13.6 % (ref 11.5–15.5)
WBC: 9.9 10*3/uL (ref 4.0–10.5)

## 2011-03-14 LAB — APTT: aPTT: 35 seconds (ref 24–37)

## 2011-03-14 LAB — BASIC METABOLIC PANEL
BUN: 19 mg/dL (ref 6–23)
CO2: 32 mEq/L (ref 19–32)
Calcium: 9.3 mg/dL (ref 8.4–10.5)
Chloride: 100 mEq/L (ref 96–112)
Creatinine, Ser: 0.91 mg/dL (ref 0.4–1.2)
GFR calc Af Amer: >60 mL/min (ref >60)
GFR calc non Af Amer: >60 mL/min (ref >60)
Glucose, Bld: 94 mg/dL (ref 70–99)
Potassium: 4.1 mEq/L (ref 3.5–5.1)
Sodium: 140 mEq/L (ref 135–145)

## 2011-03-14 LAB — PROTIME-INR
INR: 1.6 — ABNORMAL HIGH (ref 0.00–1.49)
Prothrombin Time: 19.2 seconds — ABNORMAL HIGH (ref 11.6–15.2)

## 2011-03-14 LAB — SURGICAL PCR SCREEN
MRSA, PCR: NEGATIVE
Staphylococcus aureus: NEGATIVE

## 2011-03-24 ENCOUNTER — Inpatient Hospital Stay (HOSPITAL_COMMUNITY)
Admission: RE | Admit: 2011-03-24 | Discharge: 2011-03-28 | DRG: 460 | Disposition: A | Discharge status: Home / Self Care | Payer: Medicare Other | Source: Ambulatory Visit | Attending: Neurosurgery | Admitting: Neurosurgery

## 2011-03-24 ENCOUNTER — Inpatient Hospital Stay (HOSPITAL_COMMUNITY): Payer: Medicare Other

## 2011-03-24 ENCOUNTER — Other Ambulatory Visit (HOSPITAL_COMMUNITY): Payer: Self-pay | Admitting: Neurosurgery

## 2011-03-24 ENCOUNTER — Ambulatory Visit (HOSPITAL_COMMUNITY)
Admission: RE | Admit: 2011-03-24 | Discharge: 2011-03-24 | Disposition: A | Discharge status: Home / Self Care | Payer: Medicare Other | Source: Ambulatory Visit | Attending: Neurosurgery | Admitting: Neurosurgery

## 2011-03-24 DIAGNOSIS — M545 Low back pain, unspecified: Secondary | ICD-10-CM

## 2011-03-24 DIAGNOSIS — Z951 Presence of aortocoronary bypass graft: Secondary | ICD-10-CM

## 2011-03-24 DIAGNOSIS — M4310 Spondylolisthesis, site unspecified: Secondary | ICD-10-CM | POA: Diagnosis present

## 2011-03-24 DIAGNOSIS — I2510 Atherosclerotic heart disease of native coronary artery without angina pectoris: Secondary | ICD-10-CM | POA: Diagnosis present

## 2011-03-24 DIAGNOSIS — J449 Chronic obstructive pulmonary disease, unspecified: Secondary | ICD-10-CM | POA: Diagnosis present

## 2011-03-24 DIAGNOSIS — Z86718 Personal history of other venous thrombosis and embolism: Secondary | ICD-10-CM

## 2011-03-24 DIAGNOSIS — I739 Peripheral vascular disease, unspecified: Secondary | ICD-10-CM | POA: Diagnosis present

## 2011-03-24 DIAGNOSIS — Z7901 Long term (current) use of anticoagulants: Secondary | ICD-10-CM

## 2011-03-24 DIAGNOSIS — M51379 Other intervertebral disc degeneration, lumbosacral region without mention of lumbar back pain or lower extremity pain: Principal | ICD-10-CM | POA: Diagnosis present

## 2011-03-24 DIAGNOSIS — M5137 Other intervertebral disc degeneration, lumbosacral region: Principal | ICD-10-CM | POA: Diagnosis present

## 2011-03-24 DIAGNOSIS — E039 Hypothyroidism, unspecified: Secondary | ICD-10-CM | POA: Diagnosis present

## 2011-03-24 DIAGNOSIS — J4489 Other specified chronic obstructive pulmonary disease: Secondary | ICD-10-CM | POA: Diagnosis present

## 2011-03-24 HISTORY — DX: Atherosclerotic heart disease of native coronary artery without angina pectoris: I25.10

## 2011-03-24 HISTORY — DX: Chronic obstructive pulmonary disease, unspecified: J44.9

## 2011-03-24 HISTORY — DX: Peripheral vascular disease, unspecified: I73.9

## 2011-03-24 HISTORY — DX: Essential (primary) hypertension: I10

## 2011-03-24 LAB — TYPE AND SCREEN
ABO/RH(D): O POS
Antibody Screen: NEGATIVE

## 2011-03-25 ENCOUNTER — Encounter (HOSPITAL_COMMUNITY): Payer: Self-pay | Admitting: Radiology

## 2011-03-25 ENCOUNTER — Inpatient Hospital Stay (HOSPITAL_COMMUNITY): Payer: Medicare Other

## 2011-03-25 LAB — CBC
HCT: 28 % — ABNORMAL LOW (ref 36.0–46.0)
Hemoglobin: 9.1 g/dL — ABNORMAL LOW (ref 12.0–15.0)
MCH: 29.1 pg (ref 26.0–34.0)
MCHC: 32.5 g/dL (ref 30.0–36.0)
MCV: 89.5 fL (ref 78.0–100.0)
Platelets: 233 10*3/uL (ref 150–400)
RBC: 3.13 MIL/uL — ABNORMAL LOW (ref 3.87–5.11)
RDW: 14.2 % (ref 11.5–15.5)
WBC: 12.2 10*3/uL — ABNORMAL HIGH (ref 4.0–10.5)

## 2011-03-25 MED ORDER — IOHEXOL 300 MG/ML  SOLN
100.0000 mL | Freq: Once | INTRAMUSCULAR | Status: AC | PRN
  Administered 2011-03-25: 100 mL via INTRAVENOUS

## 2011-03-26 LAB — BASIC METABOLIC PANEL
BUN: 4 mg/dL — ABNORMAL LOW (ref 6–23)
BUN: 3 mg/dL — ABNORMAL LOW (ref 6–23)
CO2: 30 mEq/L (ref 19–32)
CO2: 31 mEq/L (ref 19–32)
Calcium: 8.2 mg/dL — ABNORMAL LOW (ref 8.4–10.5)
Calcium: 8.1 mg/dL — ABNORMAL LOW (ref 8.4–10.5)
Chloride: 98 mEq/L (ref 96–112)
Chloride: 98 mEq/L (ref 96–112)
Creatinine, Ser: 0.62 mg/dL (ref 0.4–1.2)
Creatinine, Ser: 0.58 mg/dL (ref 0.4–1.2)
GFR calc Af Amer: >60 mL/min (ref >60)
GFR calc Af Amer: >60 mL/min (ref >60)
GFR calc non Af Amer: >60 mL/min (ref >60)
GFR calc non Af Amer: >60 mL/min (ref >60)
Glucose, Bld: 137 mg/dL — ABNORMAL HIGH (ref 70–99)
Glucose, Bld: 122 mg/dL — ABNORMAL HIGH (ref 70–99)
Potassium: 2.7 mEq/L — CL (ref 3.5–5.1)
Potassium: 3.2 mEq/L — ABNORMAL LOW (ref 3.5–5.1)
Sodium: 135 mEq/L (ref 135–145)
Sodium: 136 mEq/L (ref 135–145)

## 2011-03-27 NOTE — Op Note (Signed)
[name], [name]                ACCOUNT NO.:  [id number]  MEDICAL RECORD NO.:  [id number]           PATIENT TYPE:  O  LOCATION:  XRAY                         FACILITY:  [facility]  PHYSICIAN:  Donalee Citrin, M.D.        DATE OF BIRTH:  Jul 03, 1941  DATE OF PROCEDURE:  03/24/2011 DATE OF DISCHARGE:  03/24/2011                              OPERATIVE REPORT   PREOPERATIVE DIAGNOSES: 1. L1-2 instability with degenerative collapse, lumbar spinal     stenosis, and ruptured herniated nucleus pulposus at L1-2 on the     left. 2. Pseudoarthrosis at L2-3.  PROCEDURE:  Re-exploration of fusion, removal of hardware from L2-L5, decompressive lumbar laminectomy L1-2, posterior lumbar interbody fusion at L1-2 using Telamon PEEK cage packed with locally harvested allograft mixed with Actifuse and tangent allograft wedge, pedicle screw fixation using 6.35 legacy pedicle system from L1-L3, posterolateral arthrodesis L1-L3, and placement of a large Hemovac drain.  SURGEON:  Donalee Citrin, MD  ASSISTANT:  Kathaleen Maser. Pool, MD  ANESTHESIA:  General endotracheal.  HISTORY OF PRESENT ILLNESS:  The patient is a very pleasant 70 year old female who underwent lumbar spinal fixation many years ago.  The patient had progressive back, left leg pain.  Followup imaging showed severe degenerate collapse, instability, kyphosis, and vacuum phenomena in the L1-2 disk as well as enlarged free fragment herniated disk at L1-2 on the left.  CT scan also showed probable pseudoarthrosis at L2-3 with solid fusion at L3-4 and L4-5.  So due to the patient's clinical decline, failure to conservative treatment, MRI and CT findings, the patient is recommended reexploration of fusion, removal of hardware, posterior lumbar interbody fusion L1-2 as well as a redo posterolateral fusion at L2-3.  Risks and benefits of the operation were explained to the patient.  The patient understood and agreed to proceed forward.  The patient was  brought to the OR, was induced general anesthesia, The patient was brought to the OR, was induced general anesthesia, positioned prone on the Wilson frame.  Back was prepped and draped in the usual sterile fashion.  His old incision was opened up and extended cephalad.  Bovie electrocautery was used to dissect through the scar tissue and subperiosteal dissection carried through lamina of L1 and L2. The hardware was dissected out from L2 down L5.  The cross was removed. The nuts removed, the rods removed, and fusion was inspected.  She has solid fusion at L3-L5 but there was kind of a inadequate fusion at L2-3, so the CT had also showed that the L2 screws  and so these were removed and were noted to be hypermobile and loose.  The L3 screws were strong and incompetent, so they were left behind.  After the L2 screws were removed, the posterolateral T-piece had been dissected out at L1, L2, and L3, central decompression was begun at L1.  Complete medial facetectomies were performed dissecting around the scar tissue freeing up both the L1 and L2 nerves.  Aggressive foraminotomies were carriedout unroofing the L1-L3 nerve and gaining access to the lateral margin of disk space.  The large disk herniation was  immediately identified on the left side with a free fragment underneath the L1 root as it migrated superiorly from the disk space.  This was all teased away and removed with pituitary rongeurs.  Disk space was then incised and further fragments removed.  Attention was taken first to pedicle screws and pedicles were small at L1, so we used the 45 x 45 screws.  So using both AP and lateral fluoroscopy, the correct entry point was selected.  High- speed drill was used to drill down the pilot hole.  The awl was used to cannulate the pedicle and the pedicle was probed and tapped with a 4-0 tap, probed again and a 45 x 45 screw was inserted at L1 sequentially bilaterally.  All screws had excellent  purchase and AP and lateral fluoroscopy confirmed good position of screws.  After screws were placed, the L2 screws were also placed with longer screws.  A 65 x 45 has been taken down, 65 x 50 were placed and these screws had excellent purchase in the depths of the placement as well.  Attention was taken to the interbody work.  A size 8 distractor was initially inserted on the left side.  Then the right side disk space was cleaned out and size 9 distractor was inserted.  Then working on the patient's left side, again disk space was cleaned out.  The 9 distractor was removed and placed on the left, working on the right side.  The disk space was cleaned out radically with a 9 distractor.  Size 8 cutter and disc rotating cutter were used to clean up disk space and clear the endplates.  Using a downgoing Epstein curette, this was used to scrape the endplates and a Telamon PEEK cage 8 x 22 mm packed with locally harvested autograft mixed with Actifuse was inserted on the patient's right side.  Then the distractor was removed.  Fluoroscopy confirmed each step on the right and good positioning and placement of the implant.  This was then cleaned out in similar fashion on the left side again using the rotating cutter and chisel.  Epstein curettes were used also to the endplates. BMP was packed anteriorly.  Local autograft was packed centrally and anteriorly and tangent allograft wedge was placed in the left side. Again, fluoroscopy used at each step along the way to confirm placement. After all the interbody work done, the wound was copiously irrigated and hemostasis was maintained.  Aggressive decortication was carried out at T-piece and lateral gutters.  Remainder of BMP and Actifuse and autograft was then packed posterolaterally and then 80-mm rods were placed and tightened anchoring the L2-3.  The L1 screw was compressed against L2 and across the flap.  Then postop fluoroscopy again  confirmed that showed good positioning of the implant.  Large Hemovac drain was placed and the wound was closed in layers with interrupted Vicryl, and skin was closed with running 4-0 subcuticular.  Benzoin and Steri-Strips were applied.  The patient went to recovery room in stable condition. At the end of the case, all sponge, needle, and instrument count were correct.          ______________________________ Donalee Citrin, M.D.     GC/MEDQ  D:  03/24/2011  T:  03/25/2011  Job:  [id number]  Electronically Signed by Donalee Citrin M.D. on 03/27/2011 08:37:07 AM

## 2011-03-28 LAB — BASIC METABOLIC PANEL
BUN: 13 mg/dL (ref 6–23)
CO2: 29 mEq/L (ref 19–32)
Calcium: 8.2 mg/dL — ABNORMAL LOW (ref 8.4–10.5)
Chloride: 100 mEq/L (ref 96–112)
Creatinine, Ser: 0.71 mg/dL (ref 0.4–1.2)
GFR calc Af Amer: >60 mL/min (ref >60)
GFR calc non Af Amer: >60 mL/min (ref >60)
Glucose, Bld: 114 mg/dL — ABNORMAL HIGH (ref 70–99)
Potassium: 3.4 mEq/L — ABNORMAL LOW (ref 3.5–5.1)
Sodium: 136 mEq/L (ref 135–145)

## 2011-03-28 LAB — CARDIAC PANEL(CRET KIN+CKTOT+MB+TROPI)
CK, MB: 4.4 ng/mL — ABNORMAL HIGH (ref 0.3–4.0)
Relative Index: 2.8 — ABNORMAL HIGH (ref 0.0–2.5)
Total CK: 160 U/L (ref 7–177)
Troponin I: 0.26 ng/mL — ABNORMAL HIGH (ref 0.00–0.06)

## 2011-03-28 LAB — GLUCOSE, CAPILLARY: Glucose-Capillary: 116 mg/dL — ABNORMAL HIGH (ref 70–99)

## 2011-03-29 LAB — CARDIAC PANEL(CRET KIN+CKTOT+MB+TROPI)
CK, MB: 5.7 ng/mL — ABNORMAL HIGH (ref 0.3–4.0)
CK, MB: 9 ng/mL — ABNORMAL HIGH (ref 0.3–4.0)
Relative Index: 2.6 — ABNORMAL HIGH (ref 0.0–2.5)
Relative Index: 3.9 — ABNORMAL HIGH (ref 0.0–2.5)
Total CK: 221 U/L — ABNORMAL HIGH (ref 7–177)
Total CK: 233 U/L — ABNORMAL HIGH (ref 7–177)
Troponin I: 0.02 ng/mL (ref 0.00–0.06)
Troponin I: 0.05 ng/mL (ref 0.00–0.06)

## 2011-03-29 LAB — TYPE AND SCREEN
ABO/RH(D): O POS
Antibody Screen: NEGATIVE

## 2011-03-29 LAB — CBC
HCT: 35.4 % — ABNORMAL LOW (ref 36.0–46.0)
Hemoglobin: 11.6 g/dL — ABNORMAL LOW (ref 12.0–15.0)
MCHC: 32.9 g/dL (ref 30.0–36.0)
MCV: 89.8 fL (ref 78.0–100.0)
Platelets: 256 10*3/uL (ref 150–400)
RBC: 3.94 MIL/uL (ref 3.87–5.11)
RDW: 15.4 % (ref 11.5–15.5)
WBC: 8.3 10*3/uL (ref 4.0–10.5)

## 2011-03-29 LAB — BASIC METABOLIC PANEL
BUN: 21 mg/dL (ref 6–23)
CO2: 31 mEq/L (ref 19–32)
Calcium: 9.4 mg/dL (ref 8.4–10.5)
Chloride: 103 mEq/L (ref 96–112)
Creatinine, Ser: 1.18 mg/dL (ref 0.4–1.2)
GFR calc Af Amer: 55 mL/min — ABNORMAL LOW (ref >60)
GFR calc non Af Amer: 45 mL/min — ABNORMAL LOW (ref >60)
Glucose, Bld: 83 mg/dL (ref 70–99)
Potassium: 4.2 mEq/L (ref 3.5–5.1)
Sodium: 141 mEq/L (ref 135–145)

## 2011-03-29 LAB — PROTIME-INR
INR: 1 (ref 0.00–1.49)
Prothrombin Time: 13.1 seconds (ref 11.6–15.2)

## 2011-03-29 LAB — APTT: aPTT: 23 seconds — ABNORMAL LOW (ref 24–37)

## 2011-03-30 ENCOUNTER — Emergency Department (HOSPITAL_COMMUNITY)
Admission: EM | Admit: 2011-03-30 | Discharge: 2011-03-30 | Disposition: A | Discharge status: Home / Self Care | Payer: Medicare Other | Attending: Emergency Medicine | Admitting: Emergency Medicine

## 2011-03-30 ENCOUNTER — Emergency Department (HOSPITAL_COMMUNITY): Payer: Medicare Other

## 2011-03-30 DIAGNOSIS — J449 Chronic obstructive pulmonary disease, unspecified: Secondary | ICD-10-CM | POA: Insufficient documentation

## 2011-03-30 DIAGNOSIS — J4489 Other specified chronic obstructive pulmonary disease: Secondary | ICD-10-CM | POA: Insufficient documentation

## 2011-03-30 DIAGNOSIS — I4460 Unspecified fascicular block: Secondary | ICD-10-CM | POA: Insufficient documentation

## 2011-03-30 DIAGNOSIS — I4949 Other premature depolarization: Secondary | ICD-10-CM | POA: Insufficient documentation

## 2011-03-30 DIAGNOSIS — E7800 Pure hypercholesterolemia, unspecified: Secondary | ICD-10-CM | POA: Insufficient documentation

## 2011-03-30 DIAGNOSIS — G8918 Other acute postprocedural pain: Secondary | ICD-10-CM | POA: Insufficient documentation

## 2011-03-30 DIAGNOSIS — I10 Essential (primary) hypertension: Secondary | ICD-10-CM | POA: Insufficient documentation

## 2011-03-30 DIAGNOSIS — Z951 Presence of aortocoronary bypass graft: Secondary | ICD-10-CM | POA: Insufficient documentation

## 2011-03-30 DIAGNOSIS — Z9889 Other specified postprocedural states: Secondary | ICD-10-CM | POA: Insufficient documentation

## 2011-03-30 DIAGNOSIS — R110 Nausea: Secondary | ICD-10-CM | POA: Insufficient documentation

## 2011-03-30 DIAGNOSIS — E039 Hypothyroidism, unspecified: Secondary | ICD-10-CM | POA: Insufficient documentation

## 2011-03-30 DIAGNOSIS — R109 Unspecified abdominal pain: Secondary | ICD-10-CM | POA: Insufficient documentation

## 2011-03-30 LAB — COMPREHENSIVE METABOLIC PANEL
ALT: 12 U/L (ref 0–35)
AST: 17 U/L (ref 0–37)
Albumin: 2.5 g/dL — ABNORMAL LOW (ref 3.5–5.2)
Alkaline Phosphatase: 73 U/L (ref 39–117)
BUN: 6 mg/dL (ref 6–23)
CO2: 31 mEq/L (ref 19–32)
Calcium: 8.2 mg/dL — ABNORMAL LOW (ref 8.4–10.5)
Chloride: 99 mEq/L (ref 96–112)
Creatinine, Ser: 0.69 mg/dL (ref 0.4–1.2)
GFR calc Af Amer: >60 mL/min (ref >60)
GFR calc non Af Amer: >60 mL/min (ref >60)
Glucose, Bld: 108 mg/dL — ABNORMAL HIGH (ref 70–99)
Potassium: 2.5 mEq/L — CL (ref 3.5–5.1)
Sodium: 137 mEq/L (ref 135–145)
Total Bilirubin: 0.4 mg/dL (ref 0.3–1.2)
Total Protein: 5.1 g/dL — ABNORMAL LOW (ref 6.0–8.3)

## 2011-03-30 LAB — DIFFERENTIAL
Basophils Absolute: 0.1 10*3/uL (ref 0.0–0.1)
Basophils Relative: 1 % (ref 0–1)
Eosinophils Absolute: 0.2 10*3/uL (ref 0.0–0.7)
Eosinophils Relative: 2 % (ref 0–5)
Lymphocytes Relative: 22 % (ref 12–46)
Lymphs Abs: 2 10*3/uL (ref 0.7–4.0)
Monocytes Absolute: 1 10*3/uL (ref 0.1–1.0)
Monocytes Relative: 11 % (ref 3–12)
Neutro Abs: 5.9 10*3/uL (ref 1.7–7.7)
Neutrophils Relative %: 64 % (ref 43–77)

## 2011-03-30 LAB — CBC
HCT: 26.7 % — ABNORMAL LOW (ref 36.0–46.0)
Hemoglobin: 8.8 g/dL — ABNORMAL LOW (ref 12.0–15.0)
MCH: 28.9 pg (ref 26.0–34.0)
MCHC: 33 g/dL (ref 30.0–36.0)
MCV: 87.5 fL (ref 78.0–100.0)
Platelets: 327 10*3/uL (ref 150–400)
RBC: 3.05 MIL/uL — ABNORMAL LOW (ref 3.87–5.11)
RDW: 14.3 % (ref 11.5–15.5)
WBC: 9.1 10*3/uL (ref 4.0–10.5)

## 2011-03-30 LAB — URINALYSIS, ROUTINE W REFLEX MICROSCOPIC
Bilirubin Urine: NEGATIVE
Glucose, UA: NEGATIVE mg/dL
Hgb urine dipstick: NEGATIVE
Ketones, ur: NEGATIVE mg/dL
Nitrite: NEGATIVE
Protein, ur: NEGATIVE mg/dL
Specific Gravity, Urine: 1.015 (ref 1.005–1.030)
Urobilinogen, UA: 1 mg/dL (ref 0.0–1.0)
pH: 6.5 (ref 5.0–8.0)

## 2011-03-30 LAB — LIPASE, BLOOD: Lipase: 14 U/L (ref 11–59)

## 2011-03-30 LAB — PROTIME-INR
INR: 1.06 (ref 0.00–1.49)
Prothrombin Time: 14 seconds (ref 11.6–15.2)

## 2011-04-09 NOTE — Discharge Summary (Signed)
  [name], [name]                ACCOUNT NO.:  [id number]  MEDICAL RECORD NO.:  [id number]           PATIENT TYPE:  I  LOCATION:  3015                         FACILITY:  [facility]  PHYSICIAN:  Donalee Citrin, M.D.        DATE OF BIRTH:  [birth year]  DATE OF ADMISSION:  03/24/2011 DATE OF DISCHARGE:  03/28/2011                              DISCHARGE SUMMARY   ADMITTING DIAGNOSIS:  Degenerative disk disease, spondylolisthesis, L1- 2.  PROCEDURE:  Reexploration of lumbar fusion and removal of hardware, L2- L5; redo posterolateral arthrodesis, L2-3; and posterior lumbar interbody fusion, L1-2.  FINAL DIAGNOSIS:  Degenerative disk disease and spondylolisthesis at L1- 2 and pseudoarthrosis at L2-3.  HOSPITAL COURSE:  The patient was admitted and immediately went to the operating room and underwent the aforementioned procedure. Postoperatively, the patient did very well and recovering on the floor. On the floor, the patient was convalescing well, in the first 24 hours postoperatively  was having a lot of trouble with nausea and abdominal pain.  CT scan obtained showed good hardware and implant position in the surgical field with no evidence of complicating sequelae or no significant retroperitoneal hematoma, bowel injury, or anything to explain her abdominal pain.  This did improve over the next couple of days.  The patient was started on Reglan, it helped with nausea significantly.  She was ambulating well with physical therapy, voiding spontaneously, was able to be discharged home with scheduled followup in approximately a week and half.  She was discharged on p.o. pain medication, as well as muscle relaxers and suppositories for her bowels.          ______________________________ Donalee Citrin, M.D.     GC/MEDQ  D:  03/28/2011  T:  03/28/2011  Job:  [id number]  Electronically Signed by Donalee Citrin M.D. on 04/09/2011 05:22:50 PM

## 2011-05-06 NOTE — Op Note (Signed)
Judy, [name] NO.:  [id number]   MEDICAL RECORD NO.:  [id number]          PATIENT TYPE:  INP   LOCATION:  3003                         FACILITY:  [facility]   PHYSICIAN:  Donalee Citrin, M.D.        DATE OF BIRTH:  1941/12/11   DATE OF PROCEDURE:  08/20/2009  DATE OF DISCHARGE:                               OPERATIVE REPORT   PREOPERATIVE DIAGNOSES:  1. L2-L3 degenerative disk disease.  2. Lumbar spinal stenosis.  3. Degenerative scoliosis.   PROCEDURES:  1. Decompressive laminectomy at L2-L3 and posterior lumbar interbody      fusion at L2-L3 using a hybrid Telamon PEEK cage on one side with a      tangent allograft wedge on the other side.  2. Pedicle screw fixation, L2-L5 using a 6.35 Legacy pedicle screw      system.  3. Re-exploration of fusion.  4. Removal of hardware, L3-L5.  5. Posterolateral arthrodesis, L2-L3 using local autograft mixed with      Actifuse.  6. Open reduction of spinal deformity, L2-L3.  7. Placement of large Hemovac drain.   SURGEON:  Donalee Citrin, MD   ASSISTANT:  Kathaleen Maser. Pool, MD   ANESTHESIA:  General endotracheal.   HISTORY OF PRESENT ILLNESS:  The patient is a very pleasant 70 year old  female who has had previous L3-L5 fusion 10 months ago.  The patient did  very well except over the last several weeks has had progressive  worsening back and predominantly left leg pain, radiating to her hip  into the front of her thigh down to her knee.  MRI scan and CT scan  showed a questionable solid bony fusion of an L3-L5.  However, it showed  severe degenerative collapse at L2-L3, predominantly on the left with a  degenerative scoliosis at this level and severe lumbar spinal stenosis  with central ruptured disk.  The patient failed all forms of  conservative treatment and was recommended re-exploration of fusion,  removal of hardware, decompressive laminectomy at L2-L3, and posterior  lumbar interbody fusion at L2-L3, extending the  construct.  Risks and  benefits of the operation were explained to the patient, she understood  and agreed to proceed forward.  So, the patient was brought in the OR,  was induced under general anesthesia, positioned prone on the Wilson  frame.  Back was prepped and draped in the usual sterile fashion.  Her  old incision was opened up and extended cephalad.  Then, scar tissue and  subperiosteal dissection carried down at lamina of L2-L3 and the  hardware was identified and exposed from L3 down to L5.  Looking at the  construct, it appeared to be solid at L4-L5.  However, there was a small  amount of micromotion at L3-L4, so it was elected to keep the hardware  and screws in place.  At this point, the residual spinous process at L2  was removed.  Central decompression was begun.  There was a marked facet  arthropathy and ligamentous hypertrophy causing severe hourglass  compression of thecal sac at this level.  So after complete central  decompression had been carried up superiorly, the foramina were carried  out laterally.  The L2 root and the L3 root were aggressively  decompressed.  Complete medial facetectomies were performed after the  scar tissue freed up off the inferior border of the 3 pedicle.  The  aggressive underbiting of the superior facet, articulating facet complex  was carried out to gain the lateral margins of the lateral disk space.  The epidural veins were coagulated.  Then, attention was taken to  pedicle screw placement first.  Working on the patient's right side, a  high-speed drill was used to drill a pilot hole at L2 on the right.  This was cannulated with the awl, however, it was felt that I may have  had a medial breach, so the awl was removed.  The hole was waxed.  An  attempt was made at more lateral entry point, however, this ended up  going lateral, so then working between the 2 entry points and a more of  a straight trajectory with left medial trajectory, a  new pilot hole was  drilled and cannulated and this was probed, it was tapped, it was probed  again, and then a 6.5 x 45 screw was inserted at L2 on the right.  Fluoroscopy used at each step along the way as well as external and  internal bony landmarks confirmed good position of that screw and no  medial breach.  Then, the left-sided screw was inserted without  complication and after the screws had been positioned, attention was  taken to the interbody work.  The disk space was incised.  A size 7  distractor was initially inserted to reduce the deformity and the severe  collapse at L2-L3 on the left.  Then working on the right, the disk  space was cleaned out.  A size 8 distractor was inserted, then the size  7 was removed.  The 8 felt to be good sizing for the graft material,  then the disk space was cleaned out with a size 8 cutter and chisel on  the left side.  A tangent allograft was inserted on the left side.  Then  working on the right side, disk space was cleaned out in similar  fashion.  Local autograft was then packed centrally.  There were several  large free fragments.  These were subsequently removed from the left  side and this helped decompress the proximal 3 roots, but after the  central endplates had been prepped, local autograft was packed centrally  and a right-sided Telamon packed with local autograft and Actifuse was  inserted on the right side.  After all the interbody work had been done,  fluoroscopy used at each step along the way confirmed good positioning  of the instruments and the implants.  Wound was copiously irrigated and  meticulous hemostasis was maintained.  AP fluoro was brought in then to  confirm good placement of both screws, especially the one that had the  pilot hole that went lateral.  Then after this confirmed with good  positioning of the screws, aggressive decortication was carried out in T-  piece and lateral gutters.  The local autograft was  then packed  posterolaterally.  Then, rods were inserted.  Top tighteners were  tightened down at L3, L4, and L5.  The 2 screws were compressed against  L3 and a 420 cross-link was inserted.  All the neural foramina  reinspected and noted to be widely patent.  Then, Gelfoam was laid on  top of the dura.  Large Hemovac drain was spaced and the wound was  closed in layers with interrupted Vicryl and the skin was closed with  running 4-0 subcuticular.  Benzoin and Steri-Strip applied.  The patient  went to recovery room in stable condition.  At the end of the case,  sponge and instrument counts were correct.           ______________________________  Donalee Citrin, M.D.     GC/MEDQ  D:  08/20/2009  T:  08/21/2009  Job:  [id number]

## 2011-05-06 NOTE — Discharge Summary (Signed)
NAMEMARGUITA, VENNING NO.:  [id number]   MEDICAL RECORD NO.:  [id number]          PATIENT TYPE:  INP   LOCATION:  3007                         FACILITY:  [facility]   PHYSICIAN:  Donalee Citrin, M.D.        DATE OF BIRTH:  11/10/1941   DATE OF ADMISSION:  10/09/2008  DATE OF DISCHARGE:  10/14/2008                               DISCHARGE SUMMARY   ADMITTING DIAGNOSIS:  Degenerative disk disease L3-4 and L4-5 with grade  1 spondylolisthesis at L4-5.   PROCEDURE DURING THIS HOSPITALIZATION:  L3-4 and L4-5 lumbar fusion.   DISCHARGE DIAGNOSIS:  Degenerative disease lumbar spinal and stenosis L3-  L5.   HISTORY OF PRESENT ILLNESS:  The patient is very pleasant 70 year old  female who has had long-standing back pain and chronic leg pain.  The  patient was admitted to the hospital, underwent the aforementioned  procedure.  Postop, the patient did very well and recovering on the  floor.  On the floor, the patient progressed, mobilizing, physical and  occupational therapy, had significant improvement in her leg pain, had  some difficulty with postoperative back pain and this did resolve over  the next several days; however, she was slow to mobilize.  Her Hemovac  was able to be taken out on hospital day #4, and her Foley was  discontinued at that point as well.  She had an episode of hypotension  and tachycardia.  Labs were checked.  Hematocrit was low at 6.9.  The  patient was transfused 2 units of packed cells.  She responded very well  to this.  The patient was subsequently able to be discharged home on  hospital day 5 in stable condition with follow up in approximately 2  weeks.           ______________________________  Donalee Citrin, M.D.     GC/MEDQ  D:  11/22/2008  T:  11/23/2008  Job:  [id number]

## 2011-05-06 NOTE — Op Note (Signed)
[name], [name] NO.:  [id number]   MEDICAL RECORD NO.:  [id number]          PATIENT TYPE:  OIB   LOCATION:  3538                         FACILITY:  [facility]   PHYSICIAN:  Donalee Citrin, M.D.        DATE OF BIRTH:  February 12, 1941   DATE OF PROCEDURE:  DATE OF DISCHARGE:                               OPERATIVE REPORT   PREOPERATIVE DIAGNOSIS:  Cervical spinal stenosis, C3-C4 and C4-C5 from  cervical spondylosis in these two levels.   PROCEDURE:  Anterior cervical diskectomy and fusion, C3-C4 and C4-C5,  exploration and fusion, and removal of hardware C5 and C7.   ANESTHESIA:  General endotracheal.   HISTORY OF PRESENT ILLNESS:  The patient is a pleasant 70 year old  female who has had progressive worsening neck pain and bilateral  shoulder pain that has been refractory to all forms of conservative  treatment.  The patient underwent anterior cervical diskectomy and  fusion at C5-C6 and C6-C7 back about 4 months ago and pains are  progressing approximately 4-6 weeks postoperatively.  Imaging showed  progressive breakdown at C4-C5 and C3-C4 with spondylosis and mild C3-C4  subluxation as well as collapsing down on top of the C5 and C7 plate  overlying the disk space.   The patient was awaited for adequate fusion at C5 and C7 by CT criteria  and the patient was recommended removal of hardware, exploration and  fusion, and anterior cervical diskectomy and fusion at C3-C4 and C4-C5.  Risks and benefits of the operation were explained to the patient.  She  understood and agreed to proceed forth.   DESCRIPTION OF PROCEDURE:  The patient was brought to the OR, was  induced general anesthesia, positioned supine, after neck flexed  slightly and 5 pounds of Holter tracing. The right side of neck was  prepped and draped in usual sterile fashion. Preoperative x-ray  localized the appropriate level.  A curvilinear incision was made just  off the midline through a portion of the  sternocleidomastoid.  The  superficial layer of the platysma was dissected out and divided  longitudinally.  The avascular plane between sternocleidomastoid and  strap muscle was divided down to the prevertebral fascia.  The  prevertebral fascia was dissected with Kitners.  Intraoperative x-ray  confirmed localization at appropriate level at C3-C4.  Then, the plate  was immediately identified, and the scar tissue was dissected away to  expose the plate from C5 through C7 with combination of Kitners, the  longus colli was reflected laterally.  The plate was then removed and  the fusion was inspected and it appeared to be adequate solid bony  fusion from C5 through C7.  So, this plate was left off.  Then,  work  right above screw holes at C5 and C4-C5 disk space was identified and  cut with a 15-blade scalpel and annulotomy was then extended.  At C3-C4,  the longus colli was reflected laterally.  The upper level self-  retaining rectractor  was placed.  Then, after the annulotomies were  extended, 3 mm Kerrison punches was used to bite  off the anterior  osteophytes coming off the C3 and C4 vertebral bodies.  The disk space  was then drilled down at posterior annulus and osteophytic complexes.  Then, working under the microscope and on the microscopic examination,  the undersurface of the posterior annulus was bitten away with a 1-mm  Kerrison punch exposing C3-C4.  Aggressive underbiting of both C3 and C4  vertebral bodies was carried out.  The TLL was then identified and  removed a piece of fascia to expose the thecal sac.  There was very  marked stenosis coming from ligamentous hypertrophy predominantly as  well as boney structures coming from C3 body, these were all removed.  There is a large amount of uncinate hypertrophy at C3-C4 on the left.  This was all aggressively underbitten and decompressed in the proximal  C4 nerve roots bilaterally.  After adequate decompression centrally had   been obtained, the endplates were scraped, Gelfoam was placed in deep C3  through C4, and in a similar fashion, working at C4-C5, again aggressive  underbitten was bitten out with posterior annulus and posterior ligament  and large osteophyte coming from the C4 and C5 vertebral bodies.  After  adequate decompression, underbiting had been obtained, the proximal C5  pedicles were identified.  C5 nerve roots were skeletonized especially  the pedicle.  There was noted to be some soft consistency sitting on top  of the proximal C5 nerve root on the right.  This was all removed in  piecemeal fashion decompressing the nerve root.  After adequate  decompression had been obtained transected C3-4 , a 7 mm allograft was  inserted 2 mm deep.  Then, aggressive underbiting at C4-5 was carried  down as well, and this sized up to an 8 graft, 8 graft was inserted  initially.  However, this graft cracked on placement.  It had to be  removed and replaced with another 8.  Then, after this graft had been  inserted 1 mm deep; a 40-mm Venture plate was then placed and the two  screw holes at C5 were re-used with a rescue screws and 4 new holes were  drilled, all screws had excellent purchase.  Locking mechanism was  engaged.  The wound was then copiously irrigated.  Meticulous hemostasis  was maintained.  Surgifoam and Gelfoam was used to obtain hemostasis.  A  drain was placed to the size at the location of the dissection from C3  down to C7 and the wound was closed in layers with interrupted Vicryl in  platysma and running 4-0 subcuticular.  Benzoin and Steri-Strips  applied. The patient went to recovery room in stable condition. At the  end of the case, sponge counts were correct.           ______________________________  Donalee Citrin, M.D.     GC/MEDQ  D:  06/09/2008  T:  06/10/2008  Job:  [id number]

## 2011-05-06 NOTE — Op Note (Signed)
[name], CAWLEY NO.:  [id number]   MEDICAL RECORD NO.:  [id number]          PATIENT TYPE:  INP   LOCATION:  3172                         FACILITY:  [facility]   PHYSICIAN:  Donalee Citrin, M.D.        DATE OF BIRTH:  05/18/1941   DATE OF PROCEDURE:  12/20/2007  DATE OF DISCHARGE:                               OPERATIVE REPORT   PREOPERATIVE DIAGNOSIS:  Cervical spondylosis with stenosis C5-6 and C6-  7 with right greater than left C6 C7 radiculopathies.   PROCEDURE:  Anterior cervical diskectomies with fusion C5-6 and C6-7  using a 6 mm allograft wedge at C5-6, a 7 mm allograft wedge at C-7, a  40 mm Venture plate with six 30 mm variable angled screws.   The patient is a very pleasant 70 year old female who has had  longstanding neck and bilateral arm pain radiating down through her  right shoulder down in the right forearm with numbness and tingling in  both hands and fingers.  She has also had some weakness in the left arm  but also she has ruptured her biceps and this has been difficult  figuring it out.  She does have some decreased sensation in C6 nerve  root pattern in both hands and slight weakness on triceps on  preoperative exam.  Her MRI scan showed severe spondylosis with severe  spinal stenosis and spinal cord compression at C5-6 as well as  spondylosis with stenosis C6-7.  Patient is recommended to have anterior  cervical diskectomy and fusion.  The risks and benefits of the operation  were explained to the patient after she failed all forms of conservative  treatment.  She understood and agreed to proceed.   DESCRIPTION OF PROCEDURE:  The patient was brought to the OR where she  was induced under general anesthesia, positioned supine with the neck in  slight extension and 5 pounds of halter traction.  The right side of the  neck was prepped and draped in the usual sterile fashion.  Preoperative  x-ray localized the appropriate level.  A curvilinear  incision was made  just off the midline to the anterior border of the sternocleidomastoid  and superficial layer of the platysma was dissected out longitudinally.  The avascular plane between the sternocleidomastoid and the strap  muscles was developed down to the prevertebral fascia.  The prevertebral  fascia was dissected with Kitners.  Intraoperative x-ray confirmed  localization of the appropriate level in C5-6.  Annulotomy was done with  the __________ scalpel to mark the disk space.  The longus colli was  reflected laterally and self-retaining retractors were placed.  Both  annulotomies were then extended.  Large anterior osteophytes were bitten  off the C5 and C6 vertebral bodies and then using a 2 mm and 3 mm  Kerrison punch additional anterior osteophytes were bitten off the C5  and C6 vertebral bodies respectively and then both interspaces were  drilled down to the posterior annulus and posterior osteophytic  complexes.  At this point an operating microscope was draped, brought  into the field and  microscopic illumination first at C5-6 reveals there  to be marked collapse and stenosis with severe spinal compression.  This  osteophyte was drilled down, thinning it out over the posterior  longitudinal ligament.  Then using a 1 mm Kerrison punch this was able  to get underneath the osteophyte, behind the posterior longitudinal  ligament, removed in piecemeal fashion first decompressing the right  side of the spinal cord and right C6 nerve root across the pedicle and  then very large osteophytes compressing the spinal cord was then worked  around with a 1 mm Kerrison punch, taking care to avoid additional  manipulation of the thecal sac and this osteophyte was removed in  piecemeal fashion decompressing the central canal.  Aggressive  underbiting of both endplates was carried out to the left C6 nerve root  plus the left C6 pedicle and this was widely decompressed.  After all   central canal and both neural foramina were widely patent, I  decompressed the wound.  Attention was then taken to C6-7 and first a 6  mm allograft was inserted in C5-6.  Then at C6-7 in a similar fashion  the interspace was drilled down to the posterior osteophytic complex.  The ligament was __________ calcified and this was removed in a  piecemeal fashion with the Kerrison punch.  Aggressive underbiting of  both endplates was achieved, decompressing the central canal and both C7  nerve roots identified and skeletonized off the foramen.  Again, large  osteophyte was coming off the C6 vertebral body, compressing the thecal  sac.  This was all bitten away and removed in a piecemeal fashion.  At  the end of the diskectomy there was no further stenosis of the thecal  sac or either C7 nerve roots.  The endplates were scraped.  A size 7 mm  graft was then inserted.  Then the __________ was removed and a 40 mm  Venture plate was placed.  All screws had excellent purchase, locking  mechanism was engaged.  The wound was then copiously irrigated.  Meticulous hemostasis was obtained distally and proximally with  interrupted #0 Vicryl.  The skin was closed with running #4-0  subcuticular.  Benzoin and Steri-Strips were applied.  The patient went  to the recovery room in stable condition.  At the end of the case all  counts were correct.           ______________________________  Donalee Citrin, M.D.     GC/MEDQ  D:  12/20/2007  T:  12/20/2007  Job:  [id number]

## 2011-05-06 NOTE — Consult Note (Signed)
[name], [name]                ACCOUNT NO.:  [id number]   MEDICAL RECORD NO.:  [id number]           PATIENT TYPE:   LOCATION:                                 FACILITY:   PHYSICIAN:  Bevelyn Buckles. Bensimhon, MDDATE OF BIRTH:  21-Sep-1941   DATE OF CONSULTATION:  08/21/2009  DATE OF DISCHARGE:                                 CONSULTATION   PRIMARY CARDIOLOGIST:  Dr. Florian Buff in [place].   PRIMARY CARE PHYSICIAN:  Dr. Nadine Counts   REASON FOR CONSULTATION:  Chest pain.   HISTORY OF PRESENT ILLNESS:  [name] is a 70 year old female with a  known history of CAD having had CABG in 2004 and is followed on a  regular basis by  Dr. Florian Buff in [place].  She reports having a normal Myoview  in October 2009 and no significant workup since.  She also reports  intermittent left chest heaviness over the past 2 months, responds  quickly to nitroglycerin, no change/worsening over that.  The patient  has chronic dyspnea on exertion since 2004 and was diagnosed with COPD  last year and started on albuterol.  She denies any recent changes with  this as well.  The patient was seen in May 2009 and diagnosed with  likely sleep apnea and also has history significant for hypertension,  hyperlipidemia, and chronic low back pain for which she underwent  surgery yesterday.  We are consulted due to her experiencing new chest  discomfort this a.m. in the setting of her lying flat nearly all the  time as she is postop day #1.  The patient reports central chest burning  related to meals and associated with a bad taste in the mouth.  She  reports initial onset was after breakfast this a.m.  The patient took a  sublingual nitro and had relief pretty quickly.  Her symptoms returned  after eating banana at around 4 p.m. today and persisted until the  present time (6 p.m.).  She also reports feeling like she is needed to  clear her throat more and more frequently over the last several weeks.  Currently, her first set of cardiac enzymes is negative for troponin-I  and her EKG shows no significant changes compared to her prior tracing.   PAST MEDICAL HISTORY:  Please see above.  1. Hypothyroidism.  2. History of lower extremity edema, normally on Coumadin  3. CHF ?, systolic versus diastolic.   SOCIAL HISTORY:  The patient lives in [place] with her husband.  She is  a retired [employer] officer, [employer].  She has a 45 plus pack-year  smoking history, which is remote having quit 5 years ago.  No  significant ETOH use.  No illicit drug use or herbal medications.  She  attempts to eat entirely sodium avoid diet.  She is unable to regularly  exercise.   FAMILY HISTORY:  Mother deceased at age 65 from ovarian cancer.  Father  deceased at age 35 from first massive MI.   SIBLINGS:  The patient had 9 siblings, 1 sister with? CAD.   REVIEW OF  SYSTEMS:  Please see HPI.  All other systems reviewed and were  negative.   ALLERGIES:  1. PENICILLIN.  2. CODEINE.   MEDICATIONS:  Please see computer list.   PHYSICAL EXAMINATION:  VITAL SIGNS:  Temperature 98.3 degrees  Fahrenheit, BP currently 135/52 up from a low of systolic BP of 86 and  diastolic BP of 47, pulse currently 108 remains in the low 100,  respiration rate 18, O2 saturation 94% on room air.  GENERAL:  The patient is alert and oriented x3 in no apparent distress.  She is able to speak easily without respiratory distress.  HEENT:  Her head is normocephalic, atraumatic.  Pupils equal, round, and  reactive to light.  Extraocular muscles are intact.  Nares are patent  without discharge.  Oropharynx without erythema or exudates.  NECK:  Supple without lymphadenopathy.  No JVD.  HEART:  Regular with audible S1 and S2, 1/6 systolic murmur present.  Pulses 2+ and equal in both upper and lower extremities bilaterally.  LUNGS:  Clear to auscultation bilaterally.  SKIN:  No rashes, lesions, or petechiae.  ABDOMEN:  Soft,  nontender, nondistended.  Normal abdominal bowel sounds.  No rebound or guarding.  No hepatosplenomegaly.  EXTREMITIES:  No clubbing or cyanosis.  1+ pitting edema bilaterally in  lower extremities.  MUSCULOSKELETAL:  No joint deformities or effusions.  Spinal and CVA  tenderness not assessed due to the patient being in postop day #1 from  back surgery.  Cranial nerves II through XII are grossly intact.  Strength was 5/5 in all extremities and axial groups.  Normal sensation  throughout.  Cerebellar function not assessed.   RADIOLOGY:  Chest x-ray performed August 14, 2009, showed stable.  No  acute cardiopulmonary abnormality.   EKG, sinus tach with a rate of 104.  No significant ST-T wave changes.  No significant Q-waves.  Left axis deviation, question of left  fascicular block, delayed R-wave progression, PR 170, QRS 94, QTC 473.  No significant change from prior tracing completed December 3, ? year.   LABORATORY DATA:  WBC 8.3, HGB 11.6, HCT 35.4, PLT count 256.  Sodium  141, potassium 4.2, chloride 103, CO2 of 31, BUN 21, creatinine 1.18,  glucose 83, PT 13.9, INR 1.0.  First set of cardiac enzymes, CK 233, MB  9.0, and index 3.9 positive.  Troponin I-0.02.   ASSESSMENT AND PLAN:  [name] is a 70 year old Caucasian female with  the above-noted medical history presenting with atypical chest pain.   Chest pain - given high likelihood that these symptoms are GI/GERD in  origin, would not pursue further cardiac workup inpatient unless  troponin-I significantly elevated on upcoming cardiac enzymes.  If all  three sets are  negative, the patient should follow up with her primary cardiologist  outpatient.  Would start Protonix 40 mg p.o. daily now and give GI  cocktail p.r.n..  We will continue to follow while cardiac enzymes are  pending.   Thanks for the consult.      Jarrett Ables, [facility]      Daniel R. Bensimhon, MD  Electronically Signed    MS/MEDQ  D:   08/21/2009  T:  08/22/2009  Job:  [id number]

## 2011-05-06 NOTE — Discharge Summary (Signed)
[name], [name]                ACCOUNT NO.:  [id number]   MEDICAL RECORD NO.:  [id number]          PATIENT TYPE:  INP   LOCATION:  5036                         FACILITY:  [facility]   PHYSICIAN:  [name], M.D.    DATE OF BIRTH:  31-Jul-1941   DATE OF ADMISSION:  11/23/2008  DATE OF DISCHARGE:  11/24/2008                               DISCHARGE SUMMARY   PRIMARY CARE [name]:  Dr. Nadine [name].   DISCHARGE DIAGNOSES:  1. Left lower extremity deep vein thrombosis.  2. Chronic back pain.  3. Coronary artery disease status post 4-vessel coronary artery bypass      grafting on March 09, 2003.  4. Hypertension.  5. Congestive heart failure.  6. Hypothyroidism.  7. Depression.   DISCHARGE MEDICATIONS:  1. Cyclobenzaprine 10 mg 3 times daily.  2. Imdur 60 mg by mouth daily.  3. Synthroid 137 mcg daily.  4. Pravastatin 80 mg at night.  5. Xanax 0.5 mg as needed at bedtime.  6. Lisinopril 20 mg daily.  7. Lasix 80 mg twice daily.  8. Bupropion 150 mg twice daily.  9. Potassium chloride 10 mEq daily.  10.Norvasc 5 mg daily.  11.Citalopram 40 mg daily.  12.Aspirin 81 mg daily.  13.Albuterol as needed.  14.Pilocarpine both eyes twice daily.  15.Lovenox one 40 mg subcu daily.  16.Coumadin 7.5 mg daily.  17.Percocet 5/325 mg 1-2 every 4-6 hours as needed for pain.   PROCEDURES:  Left lower extremity Doppler; findings, the study is  positive for partially-occluded deep vein thrombosis in the left lower  extremity extending from the left common femoral vein to the popliteal  vein.   LABORATORY DATA UPON DISCHARGE:  Sodium 140, potassium 3.9, chloride  104, CO2 31, and glucose 96, BUN 12, creatinine 0.75, calcium 9.1.  White blood cell count 8.8, hemoglobin 12, hematocrit 35.6, platelet  count 273.  PT 14.2, INR 1.1.   BRIEF HOSPITAL COURSE:  1. [name] is a very pleasant 70 year old woman with a left lower      extremity DVT.  She underwent a L3-L4, L4-L5 lumbar fusion  on      October 09, 2008.  She complained of 1-week posterior left leg pain      that was described as aching, associated with dyspnea on exertion.      She did not complain of chest pain.  She has no family history of      clotting disorders.  Doppler confirmed left lower extremity DVT.      She was treated with 1 mg/kg b.i.d. of Lovenox, and while bridging      to Coumadin, she did complain of dyspnea on exertion but denied any      chest pain or shortness of breath while resting.  She also      maintained oxygen saturations of 90-96% on room air.  No chest CT      was indicated at this time.  As she and her husband are very      educated in terms of Coumadin testing, she was discharged home on  prescription for 7.5 mg of Coumadin to be adjusted per INR as well      as 7 days with Lovenox to bridge to a therapeutic INR of 2-3.  She      will receive home health care for INR testing and blood draws.  She      will also follow up with her primary care doctor to maintain her      INR monitoring and Coumadin prescription.  2. Chronic back pain, with bilateral L4-L5 radiculopathy, left greater      right.  I will continue her Flexeril and Percocet as needed for      pain.  3. Coronary artery disease status post 4-vessel CABG on March 09, 2003. I continued her aspirin daily and noted that her modifiable      risk factors including hypertension, hyperlipidemia had been      addressed by her primary care doctor.  4. Hypertension.  We did continue her lisinopril, Norvasc, and Lasix      with potassium replacement.  5. Hyperlipidemia.  We continued her Zocor.  6. Congestive heart failure.  [name] noted that she does have a      cardiologist where it is unknown what type of heart failure she      has.  We continued her Lasix, potassium, as well as her ACE and      noted that she is not on beta-blocker at this time.  7. Hypothyroidism.  We continued her Synthroid, this is followed  by      her primary care physician.  8. Depression.  We continued her Bupropion and Celexa throughout her      visit.   DISCHARGE INSTRUCTIONS:  [name] received adequate education in the  hospital regarding Lovenox injection as well as Coumadin and INR  testing.  She will receive blood draw through home health over the  weekend and was instructed to follow up with her primary care physician  early next week.  She was instructed to return to the emergency  department immediately if she starts having chest pain or any shortness  of breath.   The patient was discharged home in stable medical condition.      Judy Rima, MD  Electronically Signed      [name]. [place] [name], M.D.  Electronically Signed    EW/MEDQ  D:  11/26/2008  T:  11/26/2008  Job:  [id number]   cc:   [name]

## 2011-05-06 NOTE — Op Note (Signed)
NAMEDONINIQUE, [name] NO.:  [id number]   MEDICAL RECORD NO.:  [id number]          PATIENT TYPE:  INP   LOCATION:  3007                         FACILITY:  [facility]   PHYSICIAN:  Donalee Citrin, M.D.        DATE OF BIRTH:  10-12-1941   DATE OF PROCEDURE:  10/09/2008  DATE OF DISCHARGE:                               OPERATIVE REPORT   PREOPERATIVE DIAGNOSES:  1. Lumbar spinal stenosis L3-4.  2. Grade 1 spondylolisthesis, degenerative in nature at L4-5.  3. Diskogenic mechanical low back pain.  4. Bilateral L4-L5 radiculopathies.   PROCEDURE:  1. Gill decompression L5 with decompressive laminectomy L3-4 and      excessive __________ interbody fusion.  2. Posterior lumbar fusion at L3-4, L4-5 using a hybrid Telamon PEEK      cage and tangent allograft wedge at each level.  3. Pedicle screw fixation using Legacy 6.35 pedicle screw system L3-      L5.  4. Posterolateral arthrodesis L3-L5 using locally harvested autograft,      mixed with Actifuse.  5. Placement of Hemovac drain.   SURGEON:  Donalee Citrin, MD   ASSISTANT:  Kathaleen Maser. Pool, MD   ANESTHESIA:  General endotracheal.   The patient is a very pleasant 70 year old female with progressive  worsening bilateral leg pain going on now for several years.  The pain  was predominantly in her back secondarily would radiate to her hips,  down her back to both legs, occasionally the top of the foot and big  toe.  The patient failed all forms of treatment, epidural steroid  injections, physical therapy, bracing time, anti-inflammatories, and  narcotic pain medication.  The patient had MRI, findings of which showed  severe spinal stenosis at both levels with a spondylolisthesis at L4-5.  The patient is recommended decompression and stabilization procedure.  Risks, benefits of the operation were explained to the patient.   PROCEDURE IN DETAIL:  The patient was brought to the OR, was induced  with general anesthesia, placed in  supine, old incision was opened up  and subperiosteal dissection carried down the lamina of L3-L4 exposed  the T-piece at L3, L4, and L5 bilaterally.  The facet complex to be  markedly hypertrophied at both L3-4 and L4-5.  The L5-S1 complex was  noted to be degenerated, it is already fused itself, so this was left  alone.  Previous surgery at that level had been on the left, then using  after intraoperative localization at appropriate levels, spinous  processes removed at L3 and L4.  Central decompression was begun, there  was a marked ligamentous hypertrophy and facet arthropathy and severe  hourglass compression of the thecal sac at both levels, this was teased  away with 4 Penfield and central decompression was completed.  Then  working from above, marching down inferiorly, radical medial  facetectomies were performed at L3-4, unroofing both L3 and L4 at each  level using severe facet arthropathy and marked stenosis of both L3 and  L4 foramen.  These were all unroofed, decompressed.  I got down to the  superior aspect of the L4 lamina on the right.  There was noted to be a  tremendous amount of compression as well as a whole lot of scar tissue  and adhesions, this was freezed up with a 4 Penfield, marked facet  arthropathy and it looked like what could be a degenerative synovial  cyst  was appreciated, this was all teased away and removed in piecemeal  fashion.  Complete medial facetectomies performed at L4-5, again dense  amount of adhesions were on the lateral aspect of canal and thecal sac  displacing the L5 root and pedicle.  This was all freed up with a 4  Penfield and removed in piecemeal fashion decompressing both the L4 and  the L5 roots bilaterally.  At the end of the laminectomy, all foramina  were widely patent.  There was __________ reflect the right L5 nerve  root medially and __________ 11 blade scalpel.  Disk space was  cleaned  up.  The size 8 distractor was inserted at  the position of plates of the  appropriate sized graft, so as left using a size 8 cutter which is on  the left side interspace was repaired.  A fluoroscopy used each step  along the way, 8 x 22 mm Telamon PEEK cage, local autograft mixed with  Actifuse and then inserted on left side, distractor was removed.  Disk  space was cleaned out in similar fashion on the right side.  Local  autograft packed against the left side, Telamon on the right side  tangent.  After all the interbody work done at that level, fluoroscopy  confirmed good position of the spacers, testing L3-L4 in a similar  fashion.  The L3-4 disk was cleaned out, again the distractor confirmed  this to be appropriate sizing for graft material also a tangent was  inserted left side, and Telamon to the right with local autograft packed  between them.  After  all interbody work __________ placed using high  speed drill, pilot holes were drilled __________ on the right.  Cannula  with the awl probed, tapped with a 5.5 tap, probed again and a 6 x 45  screw inserted L3 on the right.  In a similar fashion.  The L4 and L5  screws were inserted with 45 screws.  Then, three screws placed on left  side.  All pedicles were competent from probing within the pedicle as  well as the external bony landmarks and fluoroscopy confirmed at each  step along the way.  After all six screws placed, wound was copiously  irrigated.  Hemostasis was maintained.  The T-piece and lateral gutters  were aggressively decorticated.  The remainder of the local autograft  was packed posterolaterally, the 6-mm rods were then placed.  Initially,  at L5, L4 screws compressed against L5, L3 compressed L4.  Then, all  these screws were torqued down, a 420 cross-link was inserted after all  the neuroforamina reinspected to confirm no migration of graft and to  confirm patency.  Then, Gelfoam was overlaid atop the dura of the  foramina, the 420 cross-link was engaged.   Postop fluoroscopy confirmed  good position of screws, rods, and bone grafts.  The wound was then  closed in layers with Vicryl after Hemovac drain was placed and the skin  was closed running for subcuticular.  The patient went to the recovery  room in stable condition.           ______________________________  Donalee Citrin, M.D.  GC/MEDQ  D:  10/09/2008  T:  10/10/2008  Job:  [id number]

## 2011-05-06 NOTE — H&P (Signed)
NAMEKERRILYN, AZBILL                ACCOUNT NO.:  [id number]   MEDICAL RECORD NO.:  [id number]          PATIENT TYPE:  INP   LOCATION:  5036                         FACILITY:  [facility]   PHYSICIAN:  Norton Blizzard, M.D.    DATE OF BIRTH:  [birth year]   DATE OF ADMISSION:  11/23/2008  DATE OF DISCHARGE:                              HISTORY & PHYSICAL   PRIMARY CARE PHYSICIAN:  Dr. Nadine Counts.   NEUROSURGEON:  Donalee Citrin, MD   CHIEF COMPLAINT:  Leg pain and DVT.   HISTORY OF PRESENT ILLNESS:  This is a 70 year old female with a history  of L3-L5 fusion with diskectomies on October 09, 2008, who presented to  her neurosurgeon's office today with 1-week history of left posterior  leg pain, different from her usual numbness and tingling that went into  her leg from her back issues.  The pain she states came on slowly like a  toothache.  It is worsened over the past week.  She has had no swelling  or rashes.  No family history of clotting disorders.  She has dyspnea  all the time but has been worse with exertion more so than usual over  the past week or 2.  She has no chest pain.  She was given 100 mg of  Lovenox in the ED after ultrasound confirmed a DVT in the left lower  extremity.   PAST MEDICAL HISTORY:  1. COPD.  2. Hypertension.  3. Chronic back pain.  4. Hyperlipidemia.  5. Hypothyroidism.  6. Bilateral carotid artery stenosis.  7. Gastroesophageal reflux disease.  8. Depression.  9. DJD.  10.Coronary artery disease status post 4-vessel CABG on March 09, 2003.  11.CHF, question if this is systolic or diastolic as no echo was      available in E-chart.   PAST SURGICAL HISTORY:  1. On October 09, 2008, the patient had an L3-5 lumbar fusion with      arthrodesis.  2. On June 07, 2008, the patient underwent anterior cervical      diskectomy and C3-5 fusion and with hardware removal of fusions C5-      C7.  3. On December 20, 2007, the patient had a C5-7 fusion with   diskectomies.   ALLERGIES:  PENICILLIN and CODEINE.   MEDICATIONS:  1. Flexeril 10 mg t.i.d. p.r.n.  2. Imdur 60 mg daily.  3. Synthroid 137 mcg daily.  4. Pravastatin 80 mg nightly.  5. Xanax 0.5 mg nightly p.r.n.  6. Lisinopril 20 mg daily.  7. Lasix 80 mg b.i.d.  8. Bupropion 150 mg b.i.d.  9. Potassium chloride 10 mEq daily.  10.Norvasc 5 mg daily.  11.Citalopram 40 mg day.  12.Aspirin 81 mg daily.  13.Albuterol as needed.  14.Pilocarpine 1 drop in each eye twice a day status post cataract      surgery.  15.Percocet as needed.   SOCIAL HISTORY:  She lives with her husband and has retired from being a  Designer, fashion/clothing.  She has a 45-year pack history of smoking  but quit 5  years ago.  Denies any alcohol and other drug use.   FAMILY HISTORY:  Mother is deceased from ovarian cancer.  Father is  deceased from lung cancer.  There is no family history of blood clots.   REVIEW OF SYSTEMS:  Positive for dyspnea.  This is negative for fevers,  chills, sweats, chest pain, bright red blood per rectum, melena, vaginal  bleeding.   PHYSICAL EXAMINATION:  VITAL SIGNS:  Temperature 97.2, pulse 105,  respirations 16, blood pressure 158/82, pulse ox 97% on room air, weight  202 pounds, which is roughly 91.7 kg.  GENERAL:  No acute distress.  HEENT:  Normocephalic, atraumatic.  Extraocular movements intact.  Pupils equal, round, and reactive to light.  No nasal discharge.  Tympanic membranes glossy with a good light reflex.  Pharynx is normal.  NECK:  No lymphadenopathy, JVD, or thyromegaly.  CARDIOVASCULAR:  Regular rate and rhythm.  No murmurs, rubs, or gallops.  LUNGS:  Clear to auscultation bilaterally.  No wheezes, rales, or  rhonchi.  ABDOMEN:  Soft, nontender, nondistended.  No hepatosplenomegaly.  Bowel  sounds positive.  EXTREMITIES:  No edema.  A  2+ DP pulses.  There are no rashes and no  erythema, and no cords are palpated in bilateral legs.   LABORATORY  DATA AND STUDIES:  Sodium 141, potassium 4.0, chloride 104,  bicarb 28, BUN 14, creatinine 0.9, glucose 90.  Hemoglobin 12.6,  hematocrit 37.0.  INR 1.0, PT 13.6.   Left lower extremity Doppler ultrasound showed a DVT in the posterior  tibial region.  Full report is not back yet.   ASSESSMENT AND PLAN:  A 70 year old female with:  1. Left lower extremity deep venous thrombosis:  She is postop about 7      weeks with a 1-week history of symptoms attributed to the DVT.  We      would start treating her with 1 mg/kg b.i.d. of Lovenox and bridge      to Coumadin.  We will check daily INRs.  We will ask the case      coordinator to assess her.  Insurance will cover home Lovenox to      continue bridging at home; otherwise, she will need to stay in the      hospital.  She is having more dyspnea with exertion, but her sats      are okay; and since CT would not change her treatment, we will not      order a CT to look for a PE at this time.  We will continue to      monitor her vital signs.  2. Chronic back pain:  Continue Percocet as needed for pain and      Flexeril as needed for muscle spasms.  3. Coronary artery disease:  Continue her aspirin and Imdur.  We will      continue to modify her risk factors as she has been as an      outpatient.  4. Hypertension:  Continue lisinopril and Norvasc.  We will monitor      her blood pressure.  5. Hypothyroidism:  We will continue her Synthroid.  She has a primary      care physician, so we will not assess lab work for this and her      other chronic issues.  6. Congestive heart failure:  We do not know what her EF is, and no      echocardiograms in E-chart here, but she has a  cardiologist that      she sees regularly, and this is not an active acute issue for her.      We will continue her Lasix with potassium supplementation and her      ACE inhibitor.  She is not on a beta-blocker, but this may be      because of diastolic CHF, but again, we do  not know if she has      diastolic or systolic dysfunction.  Diastolic would make more sense      with her longstanding COPD.  7. Depression:  We will continue bupropion and citalopram.      Norton Blizzard, M.D.  Electronically Signed     SH/MEDQ  D:  11/23/2008  T:  11/24/2008  Job:  [id number]   cc:   Sabino Snipes, M.D.

## 2011-05-06 NOTE — Assessment & Plan Note (Signed)
[facility]                        [place] [facility] OFFICE NOTE   NAME:[name], Judy DAVIDOVICH                       MRN:          [id number]  DATE:11/04/2010                            DOB:          12/12/1941    [name] is a 70 year old female who is here today for followup visit.  She has the following problem list:  1. Coronary artery disease status post coronary artery bypass graft in      2004 at [facility].  Most recent cardiac catheterization      in December 2010 showed a patent left internal mammary artery graft      to left anterior descending coronary artery, patent saphenous vein      graft to obtuse marginal 1, and occluded saphenous vein graft to      first diagonal and ramus.  There was 90% ostial left main stenosis.      She underwent angioplasty and a drug-eluting stent placement with      4.0 x 12 mm Ion stent.  There was 60-70% proximal stenosis in the      circumflex which was left to be treated medically.  Ejection      fraction was 65%.  2. Peripheral vascular disease with bilateral moderate carotid artery      stenoses.  3. Chronic obstructive pulmonary disease.  4. Previous tobacco use.  She quit in 2004.  5. History of deep vein thrombosis in the left leg.  6. Hypertension.  7. Hyperlipidemia.  8. Severe osteoarthritis.   INTERVAL HISTORY:  The patient is scheduled later this week to have left  knee replacement.  She has not been having any chest pain.  She has  chronic dyspnea related to her COPD which has not changed.  She is  complaining today of significant swelling in her right lower extremity  which started about a week ago.  It has been progressive since then with  redness and warmth to touch as well as slight tenderness in the calf  muscles.  The patient had a previous DVT in the left lower extremity but  not in the right leg.  There has been no chest pain or palpitations.  The patient is not taking Livalo  anymore and was switched to pravastatin  due to side effects.  Her Plavix has been on hold for the surgery and  she is currently taking aspirin 81 mg once daily.   PHYSICAL EXAMINATION:  GENERAL:  The patient is pleasant who is in no  acute distress.  VITAL SIGNS:  Weight is 226.4 pounds, blood pressure is 135/64, pulse is  96, oxygen saturation is 95% on room air.  HEENT:  Normocephalic, atraumatic.  NECK:  No JVD or carotid bruits.  RESPIRATORY:  Normal respiratory effort with no use of accessory  muscles.  Auscultation reveals normal breath sounds.  CARDIOVASCULAR:  Regular rate and rhythm with frequent premature beats.  There is a 2/6 systolic ejection murmur at the aortic area.  ABDOMEN:  Benign, nontender, nondistended.  EXTREMITIES:  Right lower extremity is significantly swollen with  redness as well as  slight tenderness in the calf muscle.  The left leg  is normal in size.  SKIN:  She has few bruises in the upper extremities.  There is also  redness below the knee and right leg.  PSYCHIATRIC:  She is alert and oriented x3  with normal mood and affect.  MUSCULOSKELETAL:  The patient has significant osteoarthritis with  crepitations in both knee joints upon walking.   Electrocardiogram was performed which showed normal sinus rhythm with  premature atrial contractions.  There was left axis deviation,  incomplete right bundle-branch block as well as left ventricular  hypertrophy.  There are nonspecific T-wave changes.   IMPRESSION:  1. Acute deep vein thrombosis in the right lower extremity.  The      patient's symptoms and physical examination are worrisome for deep      vein thrombosis.  Thus, I sent her for an urgent venous Doppler      evaluation at [facility].  This showed evidence of deep vein      thrombosis involving the right popliteal as well as peroneal veins.      Due to this new finding, I do not think that the patient will be      able to have her knee  replacement this week.  I recommended      cancelling the surgery.  The patient is going to contact her      surgeon and inform him.  I called the Dr. Leonia [name], her primary care      physician, and discussed the situation with him.  She did have labs      done recently last week which showed normal CBC as well as renal      function.  Thus, I will go ahead and start her on Lovenox 100 mg      twice daily for 5 days.  We will also start warfarin 5 mg at      bedtime.  She will go to have her INR checked later this week at      Dr. Curley Spice office.  He will be monitoring her ProTime.  The      patient was given written instructions regarding this.  She is not      having any symptoms suggestive of pulmonary embolism.  I explained      to her that if she develops unexplained dyspnea, then she will need      to go to the emergency room.  I asked her also to cut down on her      physical activities for a few days at least until her INR is      therapeutic.  Due to her previous history of deep vein thrombosis      in the left leg, the patient will likely require lifelong      anticoagulation with warfarin.  She can probably undergo knee      replacement in few months.  The patient has been off Plavix for the      surgery.  I think for now we will continue with Coumadin as well as      small-dose aspirin due to her previous stent.  2. Coronary artery disease:  She is currently not having any symptoms      suggestive of angina.  We will continue with low-dose aspirin in      addition to anticoagulation.  We will keep her off Plavix.  3. Hyperlipidemia:  The patient is intolerant to  most statins.  She is      currently on pravastatin and seems to be tolerating it.  4. Hypertension:  Blood pressure is well controlled.  The patient will      follow up with me in 3 months from now or earlier if needed.     Lorine Bears, MD  Electronically Signed    MA/MedQ  DD: 11/04/2010  DT: 11/05/2010  Job  #: [id number]

## 2011-05-06 NOTE — Assessment & Plan Note (Signed)
[facility]                        [place] [facility] OFFICE NOTE   NAME:[name], [name]                       MRN:          [id number]  DATE:08/13/2010                            DOB:          [birth year]    PROBLEMS LIST:  1. Coronary artery disease, status post CABG in 2004 at [facility].  Most recent cardiac catheterization in December 2010,      showed patent LIMA to LAD, patent SVG to OM1, and occluded SVG to      D1 and ramus.  There was a 90% ostial left main stenosis applying      circumflex territory.  She underwent angioplasty and a drug-eluting      stent placement with 4.0 x 12-mm iron stent.  There was 60-70%      proximal stenosis in the circumflex which was left to be treated      medically.  Ejection fraction was 65%.  2. Peripheral vascular disease with bilateral moderate carotid artery      stenosis.  3. Chronic obstructive pulmonary disease due to previous history of      smoking.  She quit in 2004.  4. Hypertension.  5. Hyperlipidemia.  6. Anemia.  7. History of deep vein thrombosis with chronic swelling in the right      leg.  8. Severe osteoarthritis.   INTERVAL HISTORY:  The patient has been doing well overall.  Her chest  pain is actually much better than before.  She denies any significant  dyspnea.  There is no dizziness or syncope.  She has severe  osteoarthritis in both knee joints and is wondering if she can undergo a  knee replacement soon.  During her last visit, I started her on  simvastatin 40 mg daily.  She reports that she had severe side effects  with it including nausea and feeling sick.  The medication was changed  by Dr. Curley Spice to Livalo 2 mg daily.  She has been taking that  medication for 2 weeks with no reported side effects.   MEDICATIONS:  1. Ferrous gluconate 325 once daily.  2. Isosorbide 120 mg once daily.  3. Bupropion 150 mg twice daily.  4. Plavix 75 mg once daily.  5.  Lisinopril 20 mg once daily.  6. Potassium chloride 10 mEq once daily.  7. Cyclobenzaprine 10 mg 3 times daily.  8. Amlodipine 5 mg daily.  9. Levothyroxine 150 mg once daily.  10.Omeprazole 40 mg daily.  11.Citalopram 20 mg 3 tablets daily.  12.Lasix 80 mg once daily.  13.Livalo 2 mg once daily.  14.She is also on inhalers.   PHYSICAL EXAMINATION:  VITAL SIGNS:  Weight is 220 pounds, blood  pressure is 124/69, pulse is 94, oxygen saturation is 97% on room air.  NECK:  No JVD or carotid bruits.  LUNGS:  Clear to auscultation.  CARDIOVASCULAR:  Regular rate and rhythm with no gallops.  There is a  2/6 systolic ejection murmur at the aortic area.  ABDOMEN:  Benign,  nontender, nondistended.  EXTREMITIES:  With +1 edema bilaterally.  IMPRESSION:  1. Coronary artery disease, status post coronary artery bypass surgery      and most recently protected left main angioplasty and stent      placement in December 2010.  She is currently doing well and has no      significant symptoms of angina.  She takes Plavix alone with no      aspirin due to easy bleeding.  She already has multiple bruises in      her forearms.  She inquired about the possibility of her undergoing      knee replacement soon.  I explained to her that Plavix cannot be      stopped safely before 1 year from the time she had angioplasty and      stent placement meaning until December 2011.  If it is stopped      before that, there is a significant risk of myocardial infarction      and stent thrombosis.  She will wait until December.  At that time,      she will most likely require a stress test before proceeding with      surgery.  We will likely request an echocardiogram also to evaluate      her cardiac murmur given that she has not had one in a long time.  2. Hyperlipidemia:  She was recently started on simvastatin; however,      she had side effects with it.  Currently, she is taking Livalo 2 mg      daily and seems  to be doing well with it.  I asked her to bring      results of her blood work during her next visit.  3. Hypertension.  Blood pressure is well-controlled.  She will      continue with current medications.  The patient will follow up in 3      months from now or earlier if needed.     Lorine Bears, MD  Electronically Signed    MA/MedQ  DD: 08/13/2010  DT: 08/14/2010  Job #: [id number]

## 2011-05-09 NOTE — Letter (Signed)
July 03, 2010    Dr. Michel [facility]  [address]  [place], [place]  [address]   RE:  [name], [name]  MRN:  [id number]  /  DOB:  1941-02-25   Dear Dr. Leonia Reader:   It was a pleasure seeing your patient today in the office, Ms. Townsend,  to establish her cardiac care with [place].  She is transferring her care  from [facility].   As you are aware, this is a pleasant 70 year old female with extensive  cardiac history.  She had coronary artery bypass surgery in 2004 and  most recently she underwent cardiac catheterization in December 2010,  where she underwent angioplasty and drug-eluting stent placement to the  left main coronary artery.  Since then she has been doing relatively  well.  Her chest pain is much better than before.  She gets some chest  tightness for a few minutes when she goes up stairs or up hill.  She  does not get chest pain with regular every-day activities.  She does not  use nitroglycerin frequently.  She has chronic exertional dyspnea with  no worsening recently.  She stopped taking aspirin about 2 months ago  due to anemia, which was thought to be due to iron deficiency.  According to the patient, her GI workup was unremarkable.   PAST MEDICAL HISTORY:  1. Coronary artery disease, status post coronary artery bypass graft      surgery in 2004 which was done at [facility].  Most      recent cardiac catheterization was in December 2010.  The      catheterization revealed a patent LIMA to LAD, patent SVG to OM1,      and occluded SVG to D1 and ramus.  There was 90% ostial left main      stenosis supplying circumflex territory.  She underwent angioplasty      and drug-eluting stent placement with a Xience stent, 4.0 x 12 mm.      There was also 60%-70% proximal stenosis in the circumflex which      was left to be treated medically.  Ejection fraction was 65%.  2. Chronic obstructive pulmonary disease due to previous  history of      smoking.  She quit smoking in 2004.  3. Hypertension.  4. Hyperlipidemia.  5. Anemia.  6. History of DVT with chronic swelling in the right leg.   MEDICATIONS:  1. Ferrous gluconate 325 mg twice a day  2. Imdur 120 mg once daily.  3. Plavix 75 mg once daily.  4. Bupropion 150 mg once daily.  5. Lisinopril 20 mg once daily.  6. Cyclobenzaprine 10 mg 3x daily.  7. Amlodipine 5 mg once daily.  8. Levothyroxine 150 mcg once daily.  9. omeprazole 40 mg once daily.  10.Celexa 20 mg once daily.  11.Furosemide 80 mg twice daily.  12.Nitroglycerin sublingual as needed.   ALLERGIES:  Include PENICILLIN.   SOCIAL HISTORY:  She quit smoking in 2004.  She denies any alcohol or  drug use.   FAMILY HISTORY:  Noncontributory.  There is no family history of  premature coronary artery disease.   REVIEW OF SYSTEMS:  Ten-point review of systems was done and was  reviewed on her check test.  Other than chest pain, dyspnea, anxiety and  depression, it is negative.   PHYSICAL EXAMINATION:  Overall she appears to be in no acute distress.  VITAL SIGNS:  Reveal a weight of 218.6 pounds, blood pressure is 128/64,  pulse is 89, oxygen saturation is 94% on room air.  NECK EXAMINATION:  Reveals no masses, no JVD or carotid bruits.  RESPIRATORY:  Normal respiratory effort with no use of accessory  muscles.  Auscultation reveals normal breath sounds with no crackles or  wheezing.  CARDIOVASCULAR EXAM:  Palpation reveals normal PMI.  There is normal S1  and S2 with no gallops or murmurs.  ABDOMEN:  Benign, nontender, nondistended with no hepatosplenomegaly.  MUSCULOSKELETAL:  There is normal muscle strength and gait.  SKIN EXAM:  Reveals no bruising.  PSYCHIATRIC:  Normal mood and affect.  She is alert, oriented x3.   Electrocardiogram was done which shows normal sinus rhythm with  incomplete right bundle branch block, left anterior fascicular block and  nonspecific T-wave changes in  the lateral leads.   IMPRESSION:  1. Coronary artery disease, status post coronary artery bypass surgery      with recent angioplasty and drug eluting stent placement to a      protected left main artery:  She seems to be doing better than      before.  She currently has New York Heart Association class II      angina.  Her blood pressure is well-controlled and she seems to be      on good medications.  The only concern that she is not taking a      statin any more.  According to her previous notes, she was on      Pravastatin 80 mg before and she is not sure why that was stopped.      I will request her recent labs that were done and we will likely      need to start a statin on her.  Given that there has been no recent      change in her symptoms, we will not the get a functional stress      test at this time.  She is currently taking Plavix without aspirin      due to issues with GI bleed.  I explained to her that ideally she      should be taking aspirin and Plavix together for at least 1 year      and after that, she can be on one of them.  2. Hypertension:  Blood pressure is well-controlled.  3. History of anemia:  Will request her labs to see the degree of      this, but will continue with Plavix for now.   Thank you for allowing me to participate in the care of your patient.    Sincerely,      Lorine Bears, MD  Electronically Signed    MA/MedQ  DD: 07/03/2010  DT: 07/03/2010  Job #: [id number]

## 2011-05-13 ENCOUNTER — Other Ambulatory Visit: Payer: Self-pay | Admitting: Neurosurgery

## 2011-05-13 DIAGNOSIS — M545 Low back pain, unspecified: Secondary | ICD-10-CM

## 2011-05-27 ENCOUNTER — Other Ambulatory Visit: Payer: Self-pay | Admitting: Neurosurgery

## 2011-05-27 ENCOUNTER — Ambulatory Visit
Admission: RE | Admit: 2011-05-27 | Discharge: 2011-05-27 | Disposition: A | Discharge status: Home / Self Care | Payer: Medicare Other | Source: Ambulatory Visit | Attending: Neurosurgery | Admitting: Neurosurgery

## 2011-05-27 DIAGNOSIS — M545 Low back pain, unspecified: Secondary | ICD-10-CM

## 2011-06-03 ENCOUNTER — Other Ambulatory Visit: Payer: Medicare Other

## 2011-06-17 ENCOUNTER — Encounter: Payer: Self-pay | Admitting: Cardiovascular Disease

## 2011-06-19 ENCOUNTER — Encounter: Payer: Self-pay | Admitting: Cardiovascular Disease

## 2011-06-19 ENCOUNTER — Ambulatory Visit (INDEPENDENT_AMBULATORY_CARE_PROVIDER_SITE_OTHER): Payer: Medicare Other | Admitting: Cardiovascular Disease

## 2011-06-19 DIAGNOSIS — I2510 Atherosclerotic heart disease of native coronary artery without angina pectoris: Secondary | ICD-10-CM

## 2011-06-19 DIAGNOSIS — I82409 Acute embolism and thrombosis of unspecified deep veins of unspecified lower extremity: Secondary | ICD-10-CM

## 2011-06-19 DIAGNOSIS — I25119 Atherosclerotic heart disease of native coronary artery with unspecified angina pectoris: Secondary | ICD-10-CM | POA: Insufficient documentation

## 2011-06-19 DIAGNOSIS — I824Z9 Acute embolism and thrombosis of unspecified deep veins of unspecified distal lower extremity: Secondary | ICD-10-CM

## 2011-06-19 DIAGNOSIS — Z01810 Encounter for preprocedural cardiovascular examination: Secondary | ICD-10-CM | POA: Insufficient documentation

## 2011-06-19 NOTE — Progress Notes (Signed)
HPI  This is a 70 year old female who is here today for a followup visit. She was most recently seen in January of this year for preoperative cardiovascular evaluation. She was supposed to have knee replacement at that time. However, the patient started having back problems and underwent back surgery without complications. She now wants to go through a left knee replacement. She has severe osteoarthritis with significant limitations due to that. She is known to have coronary artery disease status post coronary artery bypass graft surgery in 2004. She had a drug-eluting stent placement to the left main coronary artery in December of 2010. In November of last year, she had lower extremity DVT. That was her second episode. She was started on anticoagulation with warfarin. Her warfarin was stopped after her back surgery. She is currently not having any chest pain or dyspnea. She denies any palpitations or syncope. She is really not having active cardiac symptoms.  Allergies  Allergen Reactions  . Codeine   . Penicillins      Current Outpatient Prescriptions on File Prior to Visit  Medication Sig Dispense Refill  . albuterol (PROAIR HFA) 108 (90 BASE) MCG/ACT inhaler Inhale into the lungs as needed.        . ALPRAZolam (XANAX) 0.5 MG tablet Take 0.5 mg by mouth at bedtime.        Marland Kitchen amLODipine (NORVASC) 5 MG tablet Take 5 mg by mouth daily.        Marland Kitchen aspirin EC 81 MG tablet Take 81 mg by mouth daily.        . budesonide-formoterol (SYMBICORT) 160-4.5 MCG/ACT inhaler Inhale 2 puffs into the lungs 2 (two) times daily.        Marland Kitchen buPROPion (WELLBUTRIN SR) 150 MG 12 hr tablet Take 150 mg by mouth 2 (two) times daily.        . citalopram (CELEXA) 40 MG tablet Take 40 mg by mouth 3 (three) times daily.       . cyclobenzaprine (FLEXERIL) 10 MG tablet Take 10 mg by mouth 3 (three) times daily.        . furosemide (LASIX) 80 MG tablet Take 80 mg by mouth daily.       . isosorbide mononitrate (IMDUR) 60 MG 24 hr  tablet Take 60 mg by mouth daily.        Marland Kitchen levothyroxine (SYNTHROID, LEVOTHROID) 137 MCG tablet Take 137 mcg by mouth daily.        Marland Kitchen lisinopril (PRINIVIL,ZESTRIL) 20 MG tablet Take 20 mg by mouth daily.        . nabumetone (RELAFEN) 750 MG tablet Take 750 mg by mouth 2 (two) times daily.        . nitroGLYCERIN (NITROSTAT) 0.4 MG SL tablet Place 0.4 mg under the tongue every 5 (five) minutes as needed.        Marland Kitchen omeprazole (PRILOSEC) 40 MG capsule Take 40 mg by mouth daily.        Marland Kitchen oxyCODONE-acetaminophen (PERCOCET) 10-325 MG per tablet Take 1 tablet by mouth every 4 (four) hours as needed.        . pilocarpine (PILOCAR) 0.5 % ophthalmic solution 2 (two) times daily as needed.       . potassium chloride (KLOR-CON) 10 MEQ CR tablet Take 10 mEq by mouth daily.        . pramipexole (MIRAPEX) 0.25 MG tablet Take 0.75 mg by mouth at bedtime.        . pravastatin (PRAVACHOL) 80 MG tablet Take  80 mg by mouth at bedtime.        Marland Kitchen tiotropium (SPIRIVA) 18 MCG inhalation capsule Place 18 mcg into inhaler and inhale daily.        Marland Kitchen zolpidem (AMBIEN) 5 MG tablet Take 5 mg by mouth at bedtime as needed.        Marland Kitchen DISCONTD: enoxaparin (LOVENOX) 100 MG/ML SOLN Inject into the skin every 12 (twelve) hours. Use for 5 days.       Marland Kitchen DISCONTD: simvastatin (ZOCOR) 40 MG tablet Take 40 mg by mouth at bedtime. For cholesterol.       Marland Kitchen DISCONTD: warfarin (COUMADIN) 5 MG tablet Take 5 mg by mouth at bedtime.           Past Medical History  Diagnosis Date  . Hypertension   . PVD (peripheral vascular disease)   . COPD (chronic obstructive pulmonary disease)     Emphysema dxed by Dr. Sherril Croon in [place] based on PFTs per pt in 2006; placed on albuterol  . HLD (hyperlipidemia)   . Osteoarthritis   . DVT of lower extremity (deep venous thrombosis)     recurrent. bilateral (2 episodes)  . Arthralgia     NOS  . Obesity (BMI 30-39.9) 2009    BMI 33  . Hyperthyroidism   . Snoring   . SOB (shortness of breath)   . CAD  (coronary artery disease)   . Carotid artery occlusion     moderate     Past Surgical History  Procedure Date  . Back surgery 1986    lower; another scheduled for later 2009  . Ankle surgery 2004  . Groin mass open biopsy 2004  . Neck surgery 12/08    Due for repeat neck surgery  . Breast enhancement surgery   . Total hip arthroplasty 2007    Right  . Coronary artery bypass graft 2004  . Cardiac catheterization 11/2009    Patent LIMA to LAD and patent SVG to OM1. Occluded SVG to ramus and diagonal. Left main: 90% ostial stenosis, LCX 60-70% proximal stenosis  . Coronary angioplasty with stent placement 11/2009    Drug eluting stent to left main artery: 4.0 X 12 mm Ion      Family History  Problem Relation Age of Onset  . Cancer    . Heart disease    . Colitis    . Cancer Mother     Ovarian  . Cancer Father     Lung     History   Social History  . Marital Status: Married    Spouse Name: N/A    Number of Children: 3  . Years of Education: N/A   Occupational History  . retired     International aid/development worker  .     Social History Main Topics  . Smoking status: Former Smoker -- 45 years    Quit date: 12/22/2002  . Smokeless tobacco: Not on file  . Alcohol Use: No  . Drug Use: No  . Sexually Active: Not on file   Other Topics Concern  . Not on file   Social History Narrative   Married 52 years, lives with husband.3 grown children.       PHYSICAL EXAM   BP 121/61  Pulse 89  Ht 5\' 7"  (1.702 m)  Wt 189 lb (85.73 kg)  BMI 29.60 kg/m2  SpO2 95%  Constitutional: She is oriented to person, place, and time. She appears well-developed and well-nourished. No distress.  HENT: No nasal  discharge.  Head: Normocephalic and atraumatic.  Eyes: Pupils are equal, round, and reactive to light. Right eye exhibits no discharge. Left eye exhibits no discharge.  Neck: Normal range of motion. Neck supple. No JVD present. No thyromegaly present.  Cardiovascular: Normal rate,  slightly irregular, normal heart sounds and intact distal pulses. Exam reveals no gallop and no friction rub.  There is a 1/6 systolic ejection murmur in the aortic area. Pulmonary/Chest: Effort normal and breath sounds normal. No stridor. No respiratory distress. She has no wheezes. She has no rales. She exhibits no tenderness.  Abdominal: Soft. Bowel sounds are normal. She exhibits no distension. There is no tenderness. There is no rebound and no guarding.  Musculoskeletal: Normal range of motion. She exhibits no edema and no tenderness.  Neurological: She is alert and oriented to person, place, and time. Coordination normal.  Skin: Skin is warm and dry. No rash noted. She is not diaphoretic. No erythema. No pallor.  Psychiatric: She has a normal mood and affect. Her behavior is normal. Judgment and thought content normal.      EKG: Normal sinus rhythm with sinus arrhythmia. Left anterior fascicular block. Left ventricular hypertrophy. No significant change from previous ECG.   ASSESSMENT AND PLAN

## 2011-06-19 NOTE — Assessment & Plan Note (Signed)
The patient will be undergoing knee replacement. As mentioned in my last note, the patient does have known history of coronary artery disease that she has not had any active anginal symptoms since her angioplasty in December of 2010. Thus, she is considered at low to moderate risk from a cardiac standpoint. Her ECG does not show any ischemic changes. No further cardiac workup is needed at this time. The concerning issue is that this patient had DVT in the past and both lower extremities. She should get aggressive DVT prophylaxis. Given that she had a drug-eluting stent placement in the left main coronary artery, I recommend that she continues to take aspirin 81 mg once daily without interruption.

## 2011-06-19 NOTE — Patient Instructions (Signed)
Your physician recommends that you schedule a follow-up appointment in: 6 months  

## 2011-06-19 NOTE — Assessment & Plan Note (Signed)
The patient does not have any swelling at this time and no signs of DVT. She did have DVT in both sides which were unprovoked and on 2 separate occasions. Thus, I recommended before lifelong anticoagulation with warfarin if there is no current indication. This was stopped after her back surgery for unclear reasons. I will forward this to Dr. Leonia Reader, her primary care physician.

## 2011-06-19 NOTE — Assessment & Plan Note (Signed)
She seems to be stable. Continue medical therapy.

## 2011-06-23 ENCOUNTER — Other Ambulatory Visit: Payer: Self-pay | Admitting: Cardiovascular Disease

## 2011-06-23 NOTE — Progress Notes (Signed)
Addended by: Reine Just on: 06/23/2011 10:53 AM   Modules accepted: Orders

## 2011-07-14 ENCOUNTER — Ambulatory Visit: Payer: Medicare Other | Admitting: Cardiovascular Disease

## 2011-08-06 ENCOUNTER — Inpatient Hospital Stay: Admission: RE | Admit: 2011-08-06 | Payer: Medicare Other | Source: Ambulatory Visit

## 2011-08-08 ENCOUNTER — Other Ambulatory Visit: Payer: Medicare Other

## 2011-08-15 ENCOUNTER — Encounter: Payer: Self-pay | Admitting: Internal Medicine

## 2011-09-18 LAB — BASIC METABOLIC PANEL
BUN: 21
CO2: 32
Calcium: 9.2
Chloride: 102
Creatinine, Ser: 0.83
GFR calc Af Amer: >60
GFR calc non Af Amer: >60
Glucose, Bld: 82
Potassium: 3.9
Sodium: 141

## 2011-09-18 LAB — CBC
HCT: 36.8
Hemoglobin: 12.3
MCHC: 33.3
MCV: 88
Platelets: 269
RBC: 4.19
RDW: 16.8 — ABNORMAL HIGH
WBC: 9.9

## 2011-09-22 LAB — CROSSMATCH
ABO/RH(D): O POS
Antibody Screen: NEGATIVE

## 2011-09-22 LAB — CBC
HCT: 20.6 — ABNORMAL LOW
HCT: 27.1 — ABNORMAL LOW
HCT: 30.6 — ABNORMAL LOW
HCT: 38
Hemoglobin: 10 — ABNORMAL LOW
Hemoglobin: 12.5
Hemoglobin: 6.9 — CL
Hemoglobin: 9 — ABNORMAL LOW
MCHC: 32.7
MCHC: 32.8
MCHC: 33.1
MCHC: 33.4
MCV: 87.4
MCV: 87.9
MCV: 88.4
MCV: 88.9
Platelets: 166
Platelets: 183
Platelets: 188
Platelets: 246
RBC: 2.36 — ABNORMAL LOW
RBC: 3.07 — ABNORMAL LOW
RBC: 3.44 — ABNORMAL LOW
RBC: 4.32
RDW: 14.4
RDW: 14.5
RDW: 14.7
RDW: 15.6 — ABNORMAL HIGH
WBC: 11.1 — ABNORMAL HIGH
WBC: 11.4 — ABNORMAL HIGH
WBC: 11.5 — ABNORMAL HIGH
WBC: 8.3

## 2011-09-22 LAB — BASIC METABOLIC PANEL
BUN: 24 — ABNORMAL HIGH
CO2: 28
Calcium: 9.6
Chloride: 103
Creatinine, Ser: 0.98
GFR calc Af Amer: >60
GFR calc non Af Amer: 56 — ABNORMAL LOW
Glucose, Bld: 92
Potassium: 4.4
Sodium: 140

## 2011-09-22 LAB — ABO/RH: ABO/RH(D): O POS

## 2011-09-22 LAB — TYPE AND SCREEN
ABO/RH(D): O POS
Antibody Screen: NEGATIVE

## 2011-09-26 LAB — CBC
HCT: 35.6 % — ABNORMAL LOW (ref 36.0–46.0)
HCT: 34 — ABNORMAL LOW
Hemoglobin: 12 g/dL (ref 12.0–15.0)
Hemoglobin: 11.4 — ABNORMAL LOW
MCHC: 33.8 g/dL (ref 30.0–36.0)
MCHC: 33.6
MCV: 86.4 fL (ref 78.0–100.0)
MCV: 87.8
Platelets: 273 10*3/uL (ref 150–400)
Platelets: 258
RBC: 4.11 MIL/uL (ref 3.87–5.11)
RBC: 3.88
RDW: 14.9 % (ref 11.5–15.5)
RDW: 14.9
WBC: 8.8 10*3/uL (ref 4.0–10.5)
WBC: 9.8

## 2011-09-26 LAB — BASIC METABOLIC PANEL
BUN: 12 mg/dL (ref 6–23)
BUN: 16
CO2: 31 mEq/L (ref 19–32)
CO2: 27
Calcium: 9.1 mg/dL (ref 8.4–10.5)
Calcium: 8.9
Chloride: 104 mEq/L (ref 96–112)
Chloride: 106
Creatinine, Ser: 0.75 mg/dL (ref 0.4–1.2)
Creatinine, Ser: 0.75
GFR calc Af Amer: >60 mL/min (ref >60)
GFR calc Af Amer: >60
GFR calc non Af Amer: >60 mL/min (ref >60)
GFR calc non Af Amer: >60
Glucose, Bld: 96 mg/dL (ref 70–99)
Glucose, Bld: 114 — ABNORMAL HIGH
Potassium: 3.9 mEq/L (ref 3.5–5.1)
Potassium: 3.9
Sodium: 140 mEq/L (ref 135–145)
Sodium: 138

## 2011-09-26 LAB — POCT I-STAT, CHEM 8
BUN: 14 mg/dL (ref 6–23)
Calcium, Ion: 1.22 mmol/L (ref 1.12–1.32)
Chloride: 104 mEq/L (ref 96–112)
Creatinine, Ser: 0.9 mg/dL (ref 0.4–1.2)
Glucose, Bld: 90 mg/dL (ref 70–99)
HCT: 37 % (ref 36.0–46.0)
Hemoglobin: 12.6 g/dL (ref 12.0–15.0)
Potassium: 4 mEq/L (ref 3.5–5.1)
Sodium: 141 mEq/L (ref 135–145)
TCO2: 28 mmol/L (ref 0–100)

## 2011-09-26 LAB — URINALYSIS, ROUTINE W REFLEX MICROSCOPIC
Bilirubin Urine: NEGATIVE
Glucose, UA: NEGATIVE mg/dL
Ketones, ur: 15 mg/dL — AB
Nitrite: NEGATIVE
Protein, ur: NEGATIVE mg/dL
Specific Gravity, Urine: 1.028 (ref 1.005–1.030)
Urobilinogen, UA: 0.2 mg/dL (ref 0.0–1.0)
pH: 6 (ref 5.0–8.0)

## 2011-09-26 LAB — PROTIME-INR
INR: 1 (ref 0.00–1.49)
INR: 1.1 (ref 0.00–1.49)
Prothrombin Time: 13.6 seconds (ref 11.6–15.2)
Prothrombin Time: 14.2 seconds (ref 11.6–15.2)

## 2011-09-26 LAB — URINE MICROSCOPIC-ADD ON

## 2011-10-07 ENCOUNTER — Encounter: Payer: Self-pay | Admitting: Cardiovascular Disease

## 2011-10-08 ENCOUNTER — Encounter: Payer: Self-pay | Admitting: Cardiovascular Disease

## 2011-10-16 ENCOUNTER — Telehealth: Payer: Self-pay | Admitting: Cardiology

## 2011-10-16 NOTE — Telephone Encounter (Signed)
Pt was scheduled with Dr Tenny Craw tomorrow at 9:45am.

## 2011-10-16 NOTE — Telephone Encounter (Signed)
Pt's husband said she has been having chest pains about 2 times a week and would like for you to call

## 2011-10-16 NOTE — Telephone Encounter (Signed)
Pt is calling, requesting an appt sooner than the appt with Dr Jens Som on 12/31.  She states she has been having chest pain almost everyday for the past two weeks.  She states she is not having any now.  The chest pain is exertional and is relieved by one ntg.  The pain is dull and located on the right side of her chest.  She states she used to see Dr Kirke Corin but is being transferred to this clinic.

## 2011-10-17 ENCOUNTER — Ambulatory Visit (INDEPENDENT_AMBULATORY_CARE_PROVIDER_SITE_OTHER): Payer: Medicare Other | Admitting: Internal Medicine

## 2011-10-17 ENCOUNTER — Encounter: Payer: Self-pay | Admitting: *Deleted

## 2011-10-17 ENCOUNTER — Encounter: Payer: Self-pay | Admitting: Internal Medicine

## 2011-10-17 DIAGNOSIS — G4730 Sleep apnea, unspecified: Secondary | ICD-10-CM

## 2011-10-17 DIAGNOSIS — Z01810 Encounter for preprocedural cardiovascular examination: Secondary | ICD-10-CM

## 2011-10-17 DIAGNOSIS — R0602 Shortness of breath: Secondary | ICD-10-CM

## 2011-10-17 DIAGNOSIS — I82409 Acute embolism and thrombosis of unspecified deep veins of unspecified lower extremity: Secondary | ICD-10-CM

## 2011-10-17 DIAGNOSIS — E785 Hyperlipidemia, unspecified: Secondary | ICD-10-CM | POA: Insufficient documentation

## 2011-10-17 DIAGNOSIS — I10 Essential (primary) hypertension: Secondary | ICD-10-CM

## 2011-10-17 DIAGNOSIS — R079 Chest pain, unspecified: Secondary | ICD-10-CM

## 2011-10-17 DIAGNOSIS — I2510 Atherosclerotic heart disease of native coronary artery without angina pectoris: Secondary | ICD-10-CM

## 2011-10-17 LAB — CBC WITH DIFFERENTIAL/PLATELET
Basophils Absolute: 0 10*3/uL (ref 0.0–0.1)
Basophils Relative: 0.6 % (ref 0.0–3.0)
Eosinophils Absolute: 0.2 10*3/uL (ref 0.0–0.7)
Eosinophils Relative: 3 % (ref 0.0–5.0)
HCT: 30.6 % — ABNORMAL LOW (ref 36.0–46.0)
Hemoglobin: 10.1 g/dL — ABNORMAL LOW (ref 12.0–15.0)
Lymphocytes Relative: 32.1 % (ref 12.0–46.0)
Lymphs Abs: 2.2 10*3/uL (ref 0.7–4.0)
MCHC: 33.1 g/dL (ref 30.0–36.0)
MCV: 85.8 fl (ref 78.0–100.0)
Monocytes Absolute: 0.8 10*3/uL (ref 0.1–1.0)
Monocytes Relative: 11.1 % (ref 3.0–12.0)
Neutro Abs: 3.6 10*3/uL (ref 1.4–7.7)
Neutrophils Relative %: 53.2 % (ref 43.0–77.0)
Platelets: 233 10*3/uL (ref 150.0–400.0)
RBC: 3.57 Mil/uL — ABNORMAL LOW (ref 3.87–5.11)
RDW: 17.5 % — ABNORMAL HIGH (ref 11.5–14.6)
WBC: 6.9 10*3/uL (ref 4.5–10.5)

## 2011-10-17 LAB — BASIC METABOLIC PANEL
BUN: 32 mg/dL — ABNORMAL HIGH (ref 6–23)
CO2: 32 mEq/L (ref 19–32)
Calcium: 8.8 mg/dL (ref 8.4–10.5)
Chloride: 103 mEq/L (ref 96–112)
Creatinine, Ser: 1 mg/dL (ref 0.4–1.2)
GFR: 60.13 mL/min (ref >60.00)
Glucose, Bld: 81 mg/dL (ref 70–99)
Potassium: 4 mEq/L (ref 3.5–5.1)
Sodium: 141 mEq/L (ref 135–145)

## 2011-10-17 LAB — APTT: aPTT: 52 s — ABNORMAL HIGH (ref 21.7–28.8)

## 2011-10-17 LAB — PROTIME-INR
INR: 5.2 ratio — ABNORMAL HIGH (ref 0.8–1.0)
Prothrombin Time: 58.1 s (ref 10.2–12.4)

## 2011-10-17 NOTE — Assessment & Plan Note (Signed)
Hold coumadin prior.

## 2011-10-17 NOTE — Patient Instructions (Signed)
Your physician has requested that you have a cardiac catheterization. Cardiac catheterization is used to diagnose and/or treat various heart conditions. Doctors may recommend this procedure for a number of different reasons. The most common reason is to evaluate chest pain. Chest pain can be a symptom of coronary artery disease (CAD), and cardiac catheterization can show whether plaque is narrowing or blocking your heart's arteries. This procedure is also used to evaluate the valves, as well as measure the blood flow and oxygen levels in different parts of your heart. For further information please visit [email or web address] Please follow instruction sheet, as given.  Lab work today

## 2011-10-17 NOTE — Assessment & Plan Note (Signed)
Keep on pravastatin.  Will need to be followed.

## 2011-10-17 NOTE — Progress Notes (Signed)
HPI This is a 70 year old female with known coronary artery disease status post coronary artery bypass graft surgery in 2004. She had a drug-eluting stent placement to the left main coronary artery in December of 2010. In November of last year, she had lower extremity DVT. That was her second episode. She was started on anticoagulation with warfarin. Her warfarin was stopped after her back surgery.  Comes in today.  Husband is sick.  Has had surgeries Patient says if walks to mail box and back has chest pain.  If takes NTG and sits goes away. Gets Cp when lays down.  Gets reflux.  NO coughing at night. Occasional palpitations.  No dizziness.   Breathing is better with spiriva and CPAP.  Allergies  Allergen Reactions  . Codeine   . Penicillins     Current Outpatient Prescriptions  Medication Sig Dispense Refill  . albuterol (PROAIR HFA) 108 (90 BASE) MCG/ACT inhaler Inhale into the lungs as needed.        . ALPRAZolam (XANAX) 0.5 MG tablet Take 0.5 mg by mouth at bedtime.        Marland Kitchen amLODipine (NORVASC) 5 MG tablet Take 5 mg by mouth daily.        Marland Kitchen aspirin EC 81 MG tablet Take 81 mg by mouth daily.        . budesonide-formoterol (SYMBICORT) 160-4.5 MCG/ACT inhaler Inhale 2 puffs into the lungs 2 (two) times daily.        Marland Kitchen buPROPion (WELLBUTRIN SR) 150 MG 12 hr tablet Take 150 mg by mouth 2 (two) times daily.        . citalopram (CELEXA) 40 MG tablet Take 40 mg by mouth 3 (three) times daily.       . cyclobenzaprine (FLEXERIL) 10 MG tablet Take 10 mg by mouth 3 (three) times daily.        . furosemide (LASIX) 80 MG tablet Take 80 mg by mouth daily.       . isosorbide mononitrate (IMDUR) 60 MG 24 hr tablet Take 60 mg by mouth daily.        Marland Kitchen levothyroxine (SYNTHROID, LEVOTHROID) 137 MCG tablet Take 137 mcg by mouth daily.        Marland Kitchen lisinopril (PRINIVIL,ZESTRIL) 20 MG tablet Take 20 mg by mouth daily.        . nabumetone (RELAFEN) 750 MG tablet Take 750 mg by mouth 2 (two) times daily.          . nitroGLYCERIN (NITROSTAT) 0.4 MG SL tablet Place 0.4 mg under the tongue every 5 (five) minutes as needed.        Marland Kitchen omeprazole (PRILOSEC) 40 MG capsule Take 40 mg by mouth daily.        Marland Kitchen oxyCODONE-acetaminophen (PERCOCET) 10-325 MG per tablet Take 1 tablet by mouth every 4 (four) hours as needed.        . pilocarpine (PILOCAR) 0.5 % ophthalmic solution 2 (two) times daily as needed.       . pramipexole (MIRAPEX) 0.25 MG tablet Take 0.75 mg by mouth at bedtime.        . pravastatin (PRAVACHOL) 80 MG tablet Take 80 mg by mouth at bedtime.        Marland Kitchen tiotropium (SPIRIVA) 18 MCG inhalation capsule Place 18 mcg into inhaler and inhale daily.        Marland Kitchen warfarin (COUMADIN) 5 MG tablet Take 5 mg by mouth daily.        Marland Kitchen zolpidem (AMBIEN) 5 MG tablet  Take 5 mg by mouth at bedtime as needed.        . potassium chloride (KLOR-CON) 10 MEQ CR tablet Take 10 mEq by mouth daily.          Past Medical History  Diagnosis Date  . Hypertension   . PVD (peripheral vascular disease)   . COPD (chronic obstructive pulmonary disease)     Emphysema dxed by Dr. Sherril Croon in [place] based on PFTs per pt in 2006; placed on albuterol  . HLD (hyperlipidemia)   . Osteoarthritis   . DVT of lower extremity (deep venous thrombosis)     recurrent. bilateral (2 episodes)  . Arthralgia     NOS  . Obesity (BMI 30-39.9) 2009    BMI 33  . Hyperthyroidism   . Snoring   . SOB (shortness of breath)   . CAD (coronary artery disease)   . Carotid artery occlusion     moderate    Past Surgical History  Procedure Date  . Back surgery 1986    lower; another scheduled for later 2009  . Ankle surgery 2004  . Groin mass open biopsy 2004  . Neck surgery 12/08    Due for repeat neck surgery  . Breast enhancement surgery   . Total hip arthroplasty 2007    Right  . Coronary artery bypass graft 2004  . Cardiac catheterization 11/2009    Patent LIMA to LAD and patent SVG to OM1. Occluded SVG to ramus and diagonal. Left main: 90%  ostial stenosis, LCX 60-70% proximal stenosis  . Coronary angioplasty with stent placement 11/2009    Drug eluting stent to left main artery: 4.0 X 12 mm Ion     Family History  Problem Relation Age of Onset  . Cancer    . Heart disease    . Colitis    . Cancer Mother     Ovarian  . Cancer Father     Lung    History   Social History  . Marital Status: Married    Spouse Name: N/A    Number of Children: 3  . Years of Education: N/A   Occupational History  . retired     International aid/development worker  .     Social History Main Topics  . Smoking status: Former Smoker -- 45 years    Quit date: 12/22/2002  . Smokeless tobacco: Not on file  . Alcohol Use: No  . Drug Use: No  . Sexually Active: Not on file   Other Topics Concern  . Not on file   Social History Narrative   Married 52 years, lives with husband.3 grown children.    Review of Systems:  All systems reviewed.  They are negative to the above problem except as previously stated.  Vital Signs: BP 138/74  Pulse 71  Ht 5\' 9"  (1.753 m)  Wt 178 lb (80.74 kg)  BMI 26.29 kg/m2  Physical Exam Patient is in NAD  O2 sat at rest is 86 %  Says like this with walking.  With 2 L O2 improves to 97  HEENT:  Normocephalic, atraumatic. EOMI, PERRLA.  Neck: JVP is normal. No thyromegaly. Positive for  bruits.  Lungs: Decreased airflow.  No wheezes or rales.  Heart: Regular rate and rhythm. Normal S1, S2. No S3.   No significant murmurs. PMI not displaced.  Abdomen:  Supple, nontender. Normal bowel sounds. No masses. No hepatomegaly.  Extremities:   Good distal pulses throughout. No lower extremity edema.  Musculoskeletal :  moving all extremities.  Neuro:   alert and oriented x3.  CN II-XII grossly intact.  EKG:  SR.  71.  LVH.  LAFB.  Assessment and Plan:

## 2011-10-17 NOTE — Assessment & Plan Note (Signed)
Symptoms are concerning.  She is hypoxic on exam.  No evid of volume overload.  I don't think her pulmonary status is worse than when seen this summer.  Actually better per her reprot.  I cannot therefore blame her CP on decreased O2 though it is not helping. I would recomm L heart cath to define.  Would also recomm R heart cath.  Discussed with M. Arida.  He will do cath.  Will hold coumadin prior.

## 2011-10-17 NOTE — Assessment & Plan Note (Signed)
Will rx with O2.  She has used in the past but didn't feel it helped at time so stopped.

## 2011-10-17 NOTE — Assessment & Plan Note (Signed)
Using CPAP 

## 2011-10-17 NOTE — Assessment & Plan Note (Signed)
Keep on same meds  ?

## 2011-10-29 ENCOUNTER — Inpatient Hospital Stay (HOSPITAL_BASED_OUTPATIENT_CLINIC_OR_DEPARTMENT_OTHER)
Admission: RE | Admit: 2011-10-29 | Discharge: 2011-10-29 | Disposition: A | Discharge status: Home / Self Care | Payer: Medicare Other | Source: Ambulatory Visit | Attending: Cardiovascular Disease | Admitting: Cardiovascular Disease

## 2011-10-29 ENCOUNTER — Encounter (HOSPITAL_BASED_OUTPATIENT_CLINIC_OR_DEPARTMENT_OTHER)
Admission: RE | Disposition: A | Discharge status: Home / Self Care | Payer: Self-pay | Source: Ambulatory Visit | Attending: Cardiovascular Disease

## 2011-10-29 DIAGNOSIS — I739 Peripheral vascular disease, unspecified: Secondary | ICD-10-CM | POA: Insufficient documentation

## 2011-10-29 DIAGNOSIS — Y831 Surgical operation with implant of artificial internal device as the cause of abnormal reaction of the patient, or of later complication, without mention of misadventure at the time of the procedure: Secondary | ICD-10-CM | POA: Insufficient documentation

## 2011-10-29 DIAGNOSIS — I2510 Atherosclerotic heart disease of native coronary artery without angina pectoris: Secondary | ICD-10-CM | POA: Insufficient documentation

## 2011-10-29 DIAGNOSIS — I10 Essential (primary) hypertension: Secondary | ICD-10-CM | POA: Insufficient documentation

## 2011-10-29 DIAGNOSIS — E785 Hyperlipidemia, unspecified: Secondary | ICD-10-CM | POA: Insufficient documentation

## 2011-10-29 DIAGNOSIS — I209 Angina pectoris, unspecified: Secondary | ICD-10-CM | POA: Insufficient documentation

## 2011-10-29 DIAGNOSIS — I6529 Occlusion and stenosis of unspecified carotid artery: Secondary | ICD-10-CM | POA: Insufficient documentation

## 2011-10-29 DIAGNOSIS — Z86718 Personal history of other venous thrombosis and embolism: Secondary | ICD-10-CM | POA: Insufficient documentation

## 2011-10-29 DIAGNOSIS — J4489 Other specified chronic obstructive pulmonary disease: Secondary | ICD-10-CM | POA: Insufficient documentation

## 2011-10-29 DIAGNOSIS — J449 Chronic obstructive pulmonary disease, unspecified: Secondary | ICD-10-CM | POA: Insufficient documentation

## 2011-10-29 DIAGNOSIS — T82897A Other specified complication of cardiac prosthetic devices, implants and grafts, initial encounter: Secondary | ICD-10-CM | POA: Insufficient documentation

## 2011-10-29 DIAGNOSIS — I25810 Atherosclerosis of coronary artery bypass graft(s) without angina pectoris: Secondary | ICD-10-CM | POA: Insufficient documentation

## 2011-10-29 DIAGNOSIS — Z9861 Coronary angioplasty status: Secondary | ICD-10-CM | POA: Insufficient documentation

## 2011-10-29 DIAGNOSIS — Z7901 Long term (current) use of anticoagulants: Secondary | ICD-10-CM | POA: Insufficient documentation

## 2011-10-29 DIAGNOSIS — E669 Obesity, unspecified: Secondary | ICD-10-CM | POA: Insufficient documentation

## 2011-10-29 DIAGNOSIS — M1990 Unspecified osteoarthritis, unspecified site: Secondary | ICD-10-CM | POA: Insufficient documentation

## 2011-10-29 SURGERY — JV LEFT AND RIGHT HEART CATHETERIZATION WITH CORONARY ANGIOGRAM
Anesthesia: Moderate Sedation

## 2011-10-29 MED ORDER — SODIUM CHLORIDE 0.9 % IV SOLN: 1.0000 mL/kg/h | INTRAVENOUS | Status: DC

## 2011-10-29 MED ORDER — ACETAMINOPHEN 325 MG PO TABS: 650.0000 mg | ORAL_TABLET | ORAL | Status: DC | PRN

## 2011-10-29 MED ORDER — MORPHINE SULFATE 2 MG/ML IJ SOLN: 2.0000 mg | INTRAMUSCULAR | Status: DC | PRN

## 2011-10-29 NOTE — Procedures (Signed)
Cardiac Catheterization Procedure Note  Name: [name] MRN: [id number] DOB: Apr 24, 1941  Procedure: Right Heart Cath, Left Heart Cath, Selective Coronary Angiography, LV angiography  Indication:  This is a 70 year old female with known history of coronary artery disease status post coronary artery bypass graft surgery in 2004. Most recent cardiac catheterization was in December of 2010 which showed patent grafts to OM and LAD. The graft to the ramus was occluded. She had 90% left main stenosis which was treated with an angioplasty and drug-eluting stent placement. She did have 60-70% proximal left circumflex stenosis which was left to be treated medically. She also has history of recurrent DVTs on long-term anticoagulation with warfarin. She presented with progressive anginal type chest pain over the last month. This has been responding to nitroglycerin. She was seen by Dr. Tenny Craw who recommended proceeding with cardiac catheterization after we discussed the case. She was noted to be hypoxic and corrected with oxygen. Thus, a right heart catheterization was also recommended. Risks, benefits and alternatives were discussed with the patient.   Procedural Details: The right groin was prepped, draped, and anesthetized with 1% lidocaine. Using the modified Seldinger technique a 4 French sheath was placed in the right femoral artery and a 7 French sheath was placed in the right femoral vein. A Swan-Ganz catheter was used for the right heart catheterization. Standard protocol was followed for recording of right heart pressures and sampling of oxygen saturations. Fick cardiac output was calculated. The patient was noted to have significant iliac tortuosity which made it very difficult to torque the catheters. Thus, the sheath was exchanged into a long 5 [place] sheath. Standard Judkins catheters were used for selective coronary angiography and left ventriculography. Engaging the left subclavian was difficult due to  tortuosity. I was able to advance the right Judkins catheter over a Wolley wire.  There were no immediate procedural complications. The patient was transferred to the post catheterization recovery area for further monitoring.  Procedural Findings:  Hemodynamics:    RA 6 mmHg    RV 34/2 mmHg    PA 36/14/24 mmHg    PCWP 15 mmHg    LV 155/10. EDP: 21 mmHg    AO 152/66/101   Oxygen saturations:    PA 68    AO 91   Cardiac Output (Fick) 7.8                               Cardiac Index (Fick) 4   Coronary angiography:   Coronary dominance: Left    Left mainstem: Severely calcified with a stent noted. There is 90% in-stent restenosis. The stent extends into the left circumflex which is occluded proximally.    Left anterior descending (LAD): The LAD is occluded proximally but is supplied by a patent LIMA.    Left circumflex (LCx): The vessel is occluded after the left main stent in the proximal segment with faint left to left collaterals noted.    Right coronary artery (RCA): The vessel appears to be nondominant. There is a 95% proximal stenosis. The vessel appears to supply mainly to marginal branches.                                                  SVG to ramus: This graft is occluded. This is an  old finding.                           SVG to OM 3: The graft is patent without significant obstructive disease. In the proximal segment there is mild degeneration with 20% stenosis. The distal vessel appears to have moderate disease. This vessel distally seems to give collaterals to the left PDA as well as diagonal branches.                         LIMA to LAD: The graft is open without significant obstructive disease. There is a 40% anastomosis lesion.   Left ventriculography: Left ventricular systolic function is low normal, LVEF is estimated at 50-55 %, there is no significant mitral regurgitation    Final Conclusions:  1. Mildly elevated filling pressures without significant pulmonary  hypertension 2. Normal cardiac output. 3. Low normal LV systolic function with an estimated ejection fraction of 50-55%. 4. Significant three-vessel coronary artery disease with patent LIMA to LAD and SVG to OM 3. 5. Significant left main in-stent restenosis.  Recommendations: The patient has significant in-stent restenosis in the left main coronary artery. However, the left circumflex is also now occluded in the proximal segment. The SVG to OM 3 is patent. There would not be significant advantage of intervening on the left main stent due to patent grafts to LAD as well as OM 3. There is significant disease in the proximal right coronary artery but the vessel seems to be nondominant. Based on that, I recommend continuing medical therapy for coronary artery disease and angina. I will start the patient on metoprolol 25 mg twice daily. I will resume warfarin tonight and asked her to have her INR checked in 5 days. If the patient continues to have angina, consider adding Ranexa.   Lorine Bears 10/29/2011, 10:09 AM

## 2011-10-29 NOTE — H&P (View-Only) (Signed)
HPI This is a 69-year-old female with known coronary artery disease status post coronary artery bypass graft surgery in 2004. She had a drug-eluting stent placement to the left main coronary artery in December of 2010. In November of last year, she had lower extremity DVT. That was her second episode. She was started on anticoagulation with warfarin. Her warfarin was stopped after her back surgery.  Comes in today.  Husband is sick.  Has had surgeries Patient says if walks to mail box and back has chest pain.  If takes NTG and sits goes away. Gets Cp when lays down.  Gets reflux.  NO coughing at night. Occasional palpitations.  No dizziness.   Breathing is better with spiriva and CPAP.  Allergies  Allergen Reactions  . Codeine   . Penicillins     Current Outpatient Prescriptions  Medication Sig Dispense Refill  . albuterol (PROAIR HFA) 108 (90 BASE) MCG/ACT inhaler Inhale into the lungs as needed.        . ALPRAZolam (XANAX) 0.5 MG tablet Take 0.5 mg by mouth at bedtime.        . amLODipine (NORVASC) 5 MG tablet Take 5 mg by mouth daily.        . aspirin EC 81 MG tablet Take 81 mg by mouth daily.        . budesonide-formoterol (SYMBICORT) 160-4.5 MCG/ACT inhaler Inhale 2 puffs into the lungs 2 (two) times daily.        . buPROPion (WELLBUTRIN SR) 150 MG 12 hr tablet Take 150 mg by mouth 2 (two) times daily.        . citalopram (CELEXA) 40 MG tablet Take 40 mg by mouth 3 (three) times daily.       . cyclobenzaprine (FLEXERIL) 10 MG tablet Take 10 mg by mouth 3 (three) times daily.        . furosemide (LASIX) 80 MG tablet Take 80 mg by mouth daily.       . isosorbide mononitrate (IMDUR) 60 MG 24 hr tablet Take 60 mg by mouth daily.        . levothyroxine (SYNTHROID, LEVOTHROID) 137 MCG tablet Take 137 mcg by mouth daily.        . lisinopril (PRINIVIL,ZESTRIL) 20 MG tablet Take 20 mg by mouth daily.        . nabumetone (RELAFEN) 750 MG tablet Take 750 mg by mouth 2 (two) times daily.          . nitroGLYCERIN (NITROSTAT) 0.4 MG SL tablet Place 0.4 mg under the tongue every 5 (five) minutes as needed.        . omeprazole (PRILOSEC) 40 MG capsule Take 40 mg by mouth daily.        . oxyCODONE-acetaminophen (PERCOCET) 10-325 MG per tablet Take 1 tablet by mouth every 4 (four) hours as needed.        . pilocarpine (PILOCAR) 0.5 % ophthalmic solution 2 (two) times daily as needed.       . pramipexole (MIRAPEX) 0.25 MG tablet Take 0.75 mg by mouth at bedtime.        . pravastatin (PRAVACHOL) 80 MG tablet Take 80 mg by mouth at bedtime.        . tiotropium (SPIRIVA) 18 MCG inhalation capsule Place 18 mcg into inhaler and inhale daily.        . warfarin (COUMADIN) 5 MG tablet Take 5 mg by mouth daily.        . zolpidem (AMBIEN) 5 MG tablet   Take 5 mg by mouth at bedtime as needed.        . potassium chloride (KLOR-CON) 10 MEQ CR tablet Take 10 mEq by mouth daily.          Past Medical History  Diagnosis Date  . Hypertension   . PVD (peripheral vascular disease)   . COPD (chronic obstructive pulmonary disease)     Emphysema dxed by Dr. Vyas in [place] based on PFTs per pt in 2006; placed on albuterol  . HLD (hyperlipidemia)   . Osteoarthritis   . DVT of lower extremity (deep venous thrombosis)     recurrent. bilateral (2 episodes)  . Arthralgia     NOS  . Obesity (BMI 30-39.9) 2009    BMI 33  . Hyperthyroidism   . Snoring   . SOB (shortness of breath)   . CAD (coronary artery disease)   . Carotid artery occlusion     moderate    Past Surgical History  Procedure Date  . Back surgery 1986    lower; another scheduled for later 2009  . Ankle surgery 2004  . Groin mass open biopsy 2004  . Neck surgery 12/08    Due for repeat neck surgery  . Breast enhancement surgery   . Total hip arthroplasty 2007    Right  . Coronary artery bypass graft 2004  . Cardiac catheterization 11/2009    Patent LIMA to LAD and patent SVG to OM1. Occluded SVG to ramus and diagonal. Left main: 90%  ostial stenosis, LCX 60-70% proximal stenosis  . Coronary angioplasty with stent placement 11/2009    Drug eluting stent to left main artery: 4.0 X 12 mm Ion     Family History  Problem Relation Age of Onset  . Cancer    . Heart disease    . Colitis    . Cancer Mother     Ovarian  . Cancer Father     Lung    History   Social History  . Marital Status: Married    Spouse Name: N/A    Number of Children: 3  . Years of Education: N/A   Occupational History  . retired     Machine tech  .     Social History Main Topics  . Smoking status: Former Smoker -- 45 years    Quit date: 12/22/2002  . Smokeless tobacco: Not on file  . Alcohol Use: No  . Drug Use: No  . Sexually Active: Not on file   Other Topics Concern  . Not on file   Social History Narrative   Married 52 years, lives with husband.3 grown children.    Review of Systems:  All systems reviewed.  They are negative to the above problem except as previously stated.  Vital Signs: BP 138/74  Pulse 71  Ht 5' 9" (1.753 m)  Wt 178 lb (80.74 kg)  BMI 26.29 kg/m2  Physical Exam Patient is in NAD  O2 sat at rest is 86 %  Says like this with walking.  With 2 L O2 improves to 97  HEENT:  Normocephalic, atraumatic. EOMI, PERRLA.  Neck: JVP is normal. No thyromegaly. Positive for  bruits.  Lungs: Decreased airflow.  No wheezes or rales.  Heart: Regular rate and rhythm. Normal S1, S2. No S3.   No significant murmurs. PMI not displaced.  Abdomen:  Supple, nontender. Normal bowel sounds. No masses. No hepatomegaly.  Extremities:   Good distal pulses throughout. No lower extremity edema.  Musculoskeletal :  moving all extremities.  Neuro:   alert and oriented x3.  CN II-XII grossly intact.  EKG:  SR.  71.  LVH.  LAFB.  Assessment and Plan:    

## 2011-10-29 NOTE — Interval H&P Note (Signed)
History and Physical Interval Note:   10/29/2011   8:46 AM   [name]  has presented today for surgery, with the diagnosis of Chest Pain   The various methods of treatment have been discussed with the patient and family. After consideration of risks, benefits and other options for treatment, the patient has consented to  Procedure(s): JV LEFT AND RIGHT HEART CATHETERIZATION WITH CORONARY ANGIOGRAM as a surgical intervention .  The patients' history has been reviewed, patient examined, no change in status, stable for surgery.  I have reviewed the patients' chart and labs.  Questions were answered to the patient's satisfaction.     Lorine Bears  MD       Date of Initial H&P: 10/17/2011  History reviewed, patient examined, no change in status, stable for surgery.

## 2011-10-29 NOTE — OR Nursing (Signed)
Pt states no suicidal intent and denies any threats at home or in relationships.

## 2011-10-29 NOTE — OR Nursing (Signed)
Pt discharged via wheelchair to grandaughter in her car

## 2011-10-29 NOTE — OR Nursing (Signed)
Meal served at 1050. Bedrest ends at 1315

## 2011-10-31 ENCOUNTER — Encounter (HOSPITAL_BASED_OUTPATIENT_CLINIC_OR_DEPARTMENT_OTHER): Payer: Self-pay

## 2011-10-31 LAB — POCT I-STAT 3, ART BLOOD GAS (G3+)
Acid-Base Excess: 3 mmol/L — ABNORMAL HIGH (ref 0.0–2.0)
Bicarbonate: 28.8 mEq/L — ABNORMAL HIGH (ref 20.0–24.0)
O2 Saturation: 91 %
TCO2: 30 mmol/L (ref 0–100)
pCO2 arterial: 47 mmHg — ABNORMAL HIGH (ref 35.0–45.0)
pH, Arterial: 7.395 (ref 7.350–7.400)
pO2, Arterial: 61 mmHg — ABNORMAL LOW (ref 80.0–100.0)

## 2011-10-31 LAB — POCT I-STAT 3, VENOUS BLOOD GAS (G3P V)
Acid-Base Excess: 3 mmol/L — ABNORMAL HIGH (ref 0.0–2.0)
Bicarbonate: 28.8 mEq/L — ABNORMAL HIGH (ref 20.0–24.0)
O2 Saturation: 68 %
TCO2: 30 mmol/L (ref 0–100)
pCO2, Ven: 47.7 mmHg (ref 45.0–50.0)
pH, Ven: 7.388 — ABNORMAL HIGH (ref 7.250–7.300)
pO2, Ven: 36 mmHg (ref 30.0–45.0)

## 2011-11-17 ENCOUNTER — Ambulatory Visit (INDEPENDENT_AMBULATORY_CARE_PROVIDER_SITE_OTHER): Payer: Medicare Other | Admitting: Physician Assistant

## 2011-11-17 ENCOUNTER — Telehealth: Payer: Self-pay | Admitting: Physician Assistant

## 2011-11-17 ENCOUNTER — Encounter: Payer: Self-pay | Admitting: Physician Assistant

## 2011-11-17 VITALS — BP 142/76 | HR 79 | Ht 68.0 in | Wt 176.0 lb

## 2011-11-17 DIAGNOSIS — J4489 Other specified chronic obstructive pulmonary disease: Secondary | ICD-10-CM

## 2011-11-17 DIAGNOSIS — J449 Chronic obstructive pulmonary disease, unspecified: Secondary | ICD-10-CM

## 2011-11-17 DIAGNOSIS — E785 Hyperlipidemia, unspecified: Secondary | ICD-10-CM

## 2011-11-17 DIAGNOSIS — I82409 Acute embolism and thrombosis of unspecified deep veins of unspecified lower extremity: Secondary | ICD-10-CM

## 2011-11-17 DIAGNOSIS — I2510 Atherosclerotic heart disease of native coronary artery without angina pectoris: Secondary | ICD-10-CM

## 2011-11-17 DIAGNOSIS — R079 Chest pain, unspecified: Secondary | ICD-10-CM | POA: Insufficient documentation

## 2011-11-17 DIAGNOSIS — I739 Peripheral vascular disease, unspecified: Secondary | ICD-10-CM

## 2011-11-17 DIAGNOSIS — I10 Essential (primary) hypertension: Secondary | ICD-10-CM

## 2011-11-17 MED ORDER — METOPROLOL TARTRATE 50 MG PO TABS: 50.0000 mg | ORAL_TABLET | Freq: Two times a day (BID) | ORAL | Status: DC

## 2011-11-17 NOTE — Telephone Encounter (Signed)
rtned Adrian's call today about pt, Koleen Nimrod @ lunch lvm for her tcb about O2 order. Danielle Rankin

## 2011-11-17 NOTE — Patient Instructions (Signed)
Your physician has recommended you make the following change in your medication: INCREASE METOPROLOL TARTRATE TO 50 MG TWICE DAILY, A NEW PRESCRIPTION HAS BEEN SENT IN TODAY TO [facility] FOR YOU.  Your physician recommends that you schedule a follow-up appointment in: 4-6 WEEKS WITH DR. Tenny Craw

## 2011-11-17 NOTE — Telephone Encounter (Signed)
New problem [facility] pt calling about test results for oxygen ordered

## 2011-11-17 NOTE — Assessment & Plan Note (Signed)
BP elevated.  Increase metoprolol as noted.

## 2011-11-17 NOTE — Assessment & Plan Note (Signed)
Managed by PCP.  Goal LDL < 70. 

## 2011-11-17 NOTE — Assessment & Plan Note (Signed)
She has a bruit over both FA.  Her cath site appears to be stable.  I do not think she needs an ultrasound.  She denies claudication.  No further workup for now.

## 2011-11-17 NOTE — Assessment & Plan Note (Signed)
We will contact her [facility] agency and try to get her a portable O2 tank.

## 2011-11-17 NOTE — Assessment & Plan Note (Signed)
She has stable exertional angina.  This is improved with metoprolol.  She has plenty of BP and HR to work with to adjust this and I will increase her metoprolol to 50 mg BID.  Follow up with Dr. Dietrich Pates in 4-6 weeks.

## 2011-11-17 NOTE — Progress Notes (Signed)
History of Present Illness: PCP:  Dr. Eda Paschal Primary Cardiologist:  Dr. Dietrich Pates (previously Dr. Kirke Corin in [place])  [name] is a 70 y.o. female who presents for post cath follow up.  She has a h/o CAD, s/p CABG in 2004 and DES to the LM in 2010 as well as a recurrent DVT on chronic coumadin.  She was seen on 10/26 by Dr. Dietrich Pates and noted exertional angina.  Was also noted to be hypoxic and O2 was added.  LHC was arranged on 11/7 with Dr. Kirke Corin and demonstrated EF 50-55%, mild elevated filling pressures, no pulmonary HTN, LM 90% ISR, LAD and CFX occluded, S-RI occluded (old), S-OM3 ok and L-LAD ok, native nondominant RCA 95%.  The patient was noted to have significant in-stent restenosis in the left main coronary artery. However, the left circumflex was also now occluded in the proximal segment. The SVG to OM 3 was patent. There would not be significant advantage of intervening on the left main stent due to patent grafts to LAD as well as OM 3. There was significant disease in the proximal right coronary artery but the vessel seemed to be nondominant.  Based on this, Dr. Kirke Corin recommend continuing medical therapy for coronary artery disease and angina.  He put the patient on metoprolol 25 mg twice daily.  If the patient continued to have angina, Ranexa could be considered.  Since her heart cath, she notes less exertional angina.  She still has chest discomfort with exertion that resolves with rest and with NTG.  It is not any worse.  She notes DOE.  This is stable.  She needs a portable O2 tank.  She reports Class 2b-3 symptoms.  No orthopnea, PND or significant edema.  No syncope.    Past Medical History  Diagnosis Date  . Hypertension   . PVD (peripheral vascular disease)   . COPD (chronic obstructive pulmonary disease)     Emphysema dxed by Dr. Sherril Croon in [place] based on PFTs per pt in 2006; placed on albuterol  . HLD (hyperlipidemia)   . Osteoarthritis   . DVT of lower extremity (deep  venous thrombosis)     recurrent. bilateral (2 episodes)  . Arthralgia     NOS  . Obesity (BMI 30-39.9) 2009    BMI 33  . Hyperthyroidism   . Snoring   . SOB (shortness of breath)   . CAD (coronary artery disease)     s/p CABG 2004; s/p DES to LM in 2010;  LHC 10/29/11: EF 50-55%, mild elevated filling pressures, no pulmonary HTN, LM 90% ISR, LAD and CFX occluded, S-RI occluded (old), S-OM3 ok and L-LAD ok, native nondominant RCA 95% -  med rx recommended   . Carotid artery occlusion     moderate    Current Outpatient Prescriptions  Medication Sig Dispense Refill  . albuterol (PROAIR HFA) 108 (90 BASE) MCG/ACT inhaler Inhale into the lungs as needed.        . ALPRAZolam (XANAX) 0.5 MG tablet Take 0.5 mg by mouth at bedtime.        Marland Kitchen amLODipine (NORVASC) 5 MG tablet Take 5 mg by mouth daily.        Marland Kitchen aspirin EC 81 MG tablet Take 81 mg by mouth daily.        . budesonide-formoterol (SYMBICORT) 160-4.5 MCG/ACT inhaler Inhale 2 puffs into the lungs 2 (two) times daily.        Marland Kitchen buPROPion (WELLBUTRIN SR) 150 MG 12 hr  tablet Take 150 mg by mouth 2 (two) times daily.        . citalopram (CELEXA) 40 MG tablet Take 40 mg by mouth 3 (three) times daily.       . cyclobenzaprine (FLEXERIL) 10 MG tablet Take 10 mg by mouth 3 (three) times daily.        . furosemide (LASIX) 80 MG tablet Take 80 mg by mouth daily.       . isosorbide mononitrate (IMDUR) 60 MG 24 hr tablet Take 60 mg by mouth daily.        Marland Kitchen levothyroxine (SYNTHROID, LEVOTHROID) 137 MCG tablet Take 137 mcg by mouth daily.        Marland Kitchen lisinopril (PRINIVIL,ZESTRIL) 20 MG tablet Take 20 mg by mouth daily.        . nabumetone (RELAFEN) 750 MG tablet Take 750 mg by mouth 2 (two) times daily.        . nitroGLYCERIN (NITROSTAT) 0.4 MG SL tablet Place 0.4 mg under the tongue every 5 (five) minutes as needed.        Marland Kitchen omeprazole (PRILOSEC) 40 MG capsule Take 40 mg by mouth daily.        Marland Kitchen oxyCODONE-acetaminophen (PERCOCET) 10-325 MG per tablet  Take 1 tablet by mouth every 4 (four) hours as needed.        . pilocarpine (PILOCAR) 0.5 % ophthalmic solution 2 (two) times daily as needed.       . potassium chloride (KLOR-CON) 10 MEQ CR tablet Take 10 mEq by mouth daily.        . pramipexole (MIRAPEX) 0.25 MG tablet Take 0.75 mg by mouth at bedtime.        . pravastatin (PRAVACHOL) 80 MG tablet Take 80 mg by mouth at bedtime.        Marland Kitchen tiotropium (SPIRIVA) 18 MCG inhalation capsule Place 18 mcg into inhaler and inhale daily.        Marland Kitchen warfarin (COUMADIN) 5 MG tablet Take 5 mg by mouth daily.        Marland Kitchen zolpidem (AMBIEN) 5 MG tablet Take 5 mg by mouth at bedtime as needed.        Marland Kitchen DISCONTD: metoprolol tartrate (LOPRESSOR) 25 MG tablet Take 25 mg by mouth 2 (two) times daily.        . metoprolol (LOPRESSOR) 50 MG tablet Take 1 tablet (50 mg total) by mouth 2 (two) times daily.  60 tablet  11    Allergies: Allergies  Allergen Reactions  . Codeine   . Penicillins     History  Substance Use Topics  . Smoking status: Former Smoker -- 45 years    Quit date: 12/22/2002  . Smokeless tobacco: Not on file  . Alcohol Use: No     ROS:  No claudication.  Vital Signs: BP 142/76  Pulse 79  Ht 5\' 8"  (1.727 m)  Wt 176 lb (79.833 kg)  BMI 26.76 kg/m2  PHYSICAL EXAM: Well nourished, well developed, in no acute distress HEENT: normal Neck: no JVD Cardiac:  normal S1, S2; RRR; no murmur Lungs:  clear to auscultation bilaterally, no wheezing, rhonchi or rales Abd: soft, nontender, no hepatomegaly Ext: trace RLE edema; RFA site without hematoma or tenderness; she does have a soft bruit noted over bilat FA's Skin: warm and dry Neuro:  CNs 2-12 intact, no focal abnormalities noted  EKG:   NSR, HR 79, LAD, LVH, no ischemic changes, no change from prior tracings  ASSESSMENT AND PLAN:

## 2011-11-17 NOTE — Assessment & Plan Note (Signed)
Coumadin managed by primary care. 

## 2011-11-17 NOTE — Assessment & Plan Note (Signed)
Continue ASA, statin, beta blocker.  Follow up in 4-6 weeks.

## 2011-12-22 ENCOUNTER — Ambulatory Visit: Payer: Medicare Other | Admitting: Cardiology

## 2012-01-01 DIAGNOSIS — I80299 Phlebitis and thrombophlebitis of other deep vessels of unspecified lower extremity: Secondary | ICD-10-CM | POA: Diagnosis not present

## 2012-01-01 DIAGNOSIS — J069 Acute upper respiratory infection, unspecified: Secondary | ICD-10-CM | POA: Diagnosis not present

## 2012-01-02 ENCOUNTER — Ambulatory Visit: Payer: Medicare Other | Admitting: Internal Medicine

## 2012-01-19 ENCOUNTER — Encounter: Payer: Self-pay | Admitting: Internal Medicine

## 2012-01-19 ENCOUNTER — Ambulatory Visit (INDEPENDENT_AMBULATORY_CARE_PROVIDER_SITE_OTHER): Payer: Medicare Other | Admitting: Internal Medicine

## 2012-01-19 VITALS — BP 160/77 | HR 96 | Ht 69.0 in | Wt 171.0 lb

## 2012-01-19 DIAGNOSIS — I2510 Atherosclerotic heart disease of native coronary artery without angina pectoris: Secondary | ICD-10-CM

## 2012-01-19 DIAGNOSIS — I10 Essential (primary) hypertension: Secondary | ICD-10-CM

## 2012-01-19 DIAGNOSIS — I82409 Acute embolism and thrombosis of unspecified deep veins of unspecified lower extremity: Secondary | ICD-10-CM

## 2012-01-19 MED ORDER — ISOSORBIDE MONONITRATE ER 60 MG PO TB24: 60.0000 mg | ORAL_TABLET | Freq: Every day | ORAL | Status: DC

## 2012-01-19 NOTE — Progress Notes (Signed)
HPI  Patient is a 71 year old with a history of CAD (s/p CABG in 2004; s/p DES to the L main in 2010).  Recently had L heart cath (November 2012).  LVEF 50to 55%.  Mild increased filling pressures.  LM 90% ISR; LAD and LCx occluded.  SRI occluded(old); S-)M3 OD; L to LAD OK.  Native nondominant RC 95%.  Plan was for medical Rx.  MKirke Corin added metoprolol to regimen (25 bid) Patient was seen by Wende Mott.  He increase metoprolol to 50 bid.   The patient says with this increase shed vomited after taking dose.  She tried for several days.  She even backed down to 25 bid.  Continued to vomit.  Stopped med and vomiting stopped. She is now on Imdur and says she is feeling OK  No N/V  Breathing is OK  No CP.  Allergies  Allergen Reactions  . Codeine   . Penicillins     Current Outpatient Prescriptions  Medication Sig Dispense Refill  . ALPRAZolam (XANAX) 0.5 MG tablet Take 0.5 mg by mouth at bedtime.        Marland Kitchen aspirin EC 81 MG tablet Take 81 mg by mouth daily.        Marland Kitchen buPROPion (WELLBUTRIN SR) 150 MG 12 hr tablet Take 150 mg by mouth 2 (two) times daily.        . citalopram (CELEXA) 40 MG tablet Take 40 mg by mouth 3 (three) times daily.       . cyclobenzaprine (FLEXERIL) 10 MG tablet Take 10 mg by mouth 3 (three) times daily.        . furosemide (LASIX) 80 MG tablet Take 80 mg by mouth daily.       . isosorbide mononitrate (IMDUR) 60 MG 24 hr tablet Take 60 mg by mouth daily.        Marland Kitchen levothyroxine (SYNTHROID, LEVOTHROID) 137 MCG tablet Take 137 mcg by mouth daily.        Marland Kitchen lisinopril (PRINIVIL,ZESTRIL) 20 MG tablet Take 20 mg by mouth daily.        . nabumetone (RELAFEN) 750 MG tablet Take 750 mg by mouth 2 (two) times daily.        . nitroGLYCERIN (NITROSTAT) 0.4 MG SL tablet Place 0.4 mg under the tongue every 5 (five) minutes as needed.        Marland Kitchen omeprazole (PRILOSEC) 40 MG capsule Take 40 mg by mouth daily.        Marland Kitchen oxyCODONE-acetaminophen (PERCOCET) 10-325 MG per tablet Take 1 tablet by  mouth every 4 (four) hours as needed.        . potassium chloride (KLOR-CON) 10 MEQ CR tablet Take 10 mEq by mouth daily.        . pramipexole (MIRAPEX) 0.25 MG tablet Take 0.75 mg by mouth at bedtime.        . pravastatin (PRAVACHOL) 80 MG tablet Take 80 mg by mouth at bedtime.        Marland Kitchen tiotropium (SPIRIVA) 18 MCG inhalation capsule Place 18 mcg into inhaler and inhale daily.      . pilocarpine (PILOCAR) 0.5 % ophthalmic solution 2 (two) times daily as needed.         Past Medical History  Diagnosis Date  . Hypertension   . PVD (peripheral vascular disease)   . COPD (chronic obstructive pulmonary disease)     Emphysema dxed by Dr. Sherril Croon in [place] based on PFTs per pt in 2006; placed on  albuterol  . HLD (hyperlipidemia)   . Osteoarthritis   . DVT of lower extremity (deep venous thrombosis)     recurrent. bilateral (2 episodes)  . Arthralgia     NOS  . Obesity (BMI 30-39.9) 2009    BMI 33  . Hyperthyroidism   . Snoring   . SOB (shortness of breath)   . CAD (coronary artery disease)     s/p CABG 2004; s/p DES to LM in 2010;  LHC 10/29/11: EF 50-55%, mild elevated filling pressures, no pulmonary HTN, LM 90% ISR, LAD and CFX occluded, S-RI occluded (old), S-OM3 ok and L-LAD ok, native nondominant RCA 95% -  med rx recommended   . Carotid artery occlusion     moderate    Past Surgical History  Procedure Date  . Back surgery 1986    lower; another scheduled for later 2009  . Ankle surgery 2004  . Groin mass open biopsy 2004  . Neck surgery 12/08    Due for repeat neck surgery  . Breast enhancement surgery   . Total hip arthroplasty 2007    Right  . Coronary artery bypass graft 2004  . Cardiac catheterization 11/2009    Patent LIMA to LAD and patent SVG to OM1. Occluded SVG to ramus and diagonal. Left main: 90% ostial stenosis, LCX 60-70% proximal stenosis  . Coronary angioplasty with stent placement 11/2009    Drug eluting stent to left main artery: 4.0 X 12 mm Ion      Family History  Problem Relation Age of Onset  . Cancer    . Heart disease    . Colitis    . Cancer Mother     Ovarian  . Cancer Father     Lung    History   Social History  . Marital Status: Married    Spouse Name: N/A    Number of Children: 3  . Years of Education: N/A   Occupational History  . retired     International aid/development worker  .     Social History Main Topics  . Smoking status: Former Smoker -- 45 years    Quit date: 12/22/2002  . Smokeless tobacco: Not on file  . Alcohol Use: No  . Drug Use: No  . Sexually Active: Not on file   Other Topics Concern  . Not on file   Social History Narrative   Married 52 years, lives with husband.3 grown children.    Review of Systems:  All systems reviewed.  They are negative to the above problem except as previously stated.  Vital Signs: BP 160/77  Pulse 96  Ht 5\' 9"  (1.753 m)  Wt 171 lb (77.565 kg)  BMI 25.25 kg/m2  Physical Exam Patient is in NAD  HEENT:  Normocephalic, atraumatic. EOMI, PERRLA.  Neck: JVP is normal. No thyromegaly. No bruits.  Lungs: clear to auscultation. No rales no wheezes.  Heart: Regular rate and rhythm. Normal S1, S2. No S3.   No significant murmurs. PMI not displaced.  Abdomen:  Supple, nontender. Normal bowel sounds. No masses. No hepatomegaly.  Extremities:   Good distal pulses throughout. No lower extremity edema.  Musculoskeletal :moving all extremities.  Neuro:   alert and oriented x3.  CN II-XII grossly intact.   Assessment and Plan:

## 2012-01-19 NOTE — Patient Instructions (Signed)
Your physician wants you to follow-up in: September 2013 You will receive a reminder letter in the mail two months in advance. If you don't receive a letter, please call our office to schedule the follow-up appointment.  

## 2012-01-19 NOTE — Assessment & Plan Note (Signed)
BP is high now.  Will need to be followed

## 2012-01-19 NOTE — Assessment & Plan Note (Signed)
On pradaxa or xarelto.  Will confirm with pharmacy.  Using samples now.

## 2012-01-19 NOTE — Assessment & Plan Note (Signed)
I am not sure what happened with the metoprolol.  She is doing OK on the current regimen.  I would continue.

## 2012-01-30 DIAGNOSIS — M12519 Traumatic arthropathy, unspecified shoulder: Secondary | ICD-10-CM | POA: Diagnosis not present

## 2012-02-11 ENCOUNTER — Encounter (HOSPITAL_COMMUNITY): Payer: Self-pay

## 2012-02-13 ENCOUNTER — Telehealth: Payer: Self-pay | Admitting: Internal Medicine

## 2012-02-13 NOTE — Telephone Encounter (Signed)
02/13/12--ms Garrette calling about d/cing xarelto for upcoming shoulder surgery--she has an appoint Monday with dr ross--advised to ask dr Tenny Craw for surgical clearance at appoint on Monday and they will inform her about stopping xarelto at that appoint--pt agrees--nt

## 2012-02-13 NOTE — Telephone Encounter (Signed)
New problem:  Patient calling - upcoming surgery on  3/7.  When should patient go come off  xarelto  20 mg.

## 2012-02-16 ENCOUNTER — Encounter: Payer: Self-pay | Admitting: Internal Medicine

## 2012-02-16 ENCOUNTER — Ambulatory Visit (INDEPENDENT_AMBULATORY_CARE_PROVIDER_SITE_OTHER): Payer: Medicare Other | Admitting: Internal Medicine

## 2012-02-16 VITALS — BP 149/75 | HR 87 | Ht 69.0 in | Wt 176.4 lb

## 2012-02-16 DIAGNOSIS — I2510 Atherosclerotic heart disease of native coronary artery without angina pectoris: Secondary | ICD-10-CM | POA: Diagnosis not present

## 2012-02-16 DIAGNOSIS — I10 Essential (primary) hypertension: Secondary | ICD-10-CM | POA: Diagnosis not present

## 2012-02-16 DIAGNOSIS — E785 Hyperlipidemia, unspecified: Secondary | ICD-10-CM

## 2012-02-16 DIAGNOSIS — I82409 Acute embolism and thrombosis of unspecified deep veins of unspecified lower extremity: Secondary | ICD-10-CM | POA: Diagnosis not present

## 2012-02-16 NOTE — Progress Notes (Signed)
HPI Patient is a 71 year old with a history of CAD (s/p CABG in 2004; s/p DES to the L main in 2010). Recently had L heart cath (November 2012). LVEF 50to 55%. Mild increased filling pressures. LM 90% ISR; LAD and LCx occluded. SRI occluded(old); S-)M3 OD; L to LAD OK. Native nondominant RC 95%. Plan was for medical Rx. MKirke Corin added metoprolol to regimen (25 bid)  She did not tolerate.  Had vomiting.  Stopped.  Since then she has done fine.  I saw her in late January. Since seen she has continued to do well.  Note that her primary MD has switched her from coumadin to Xarelto for recurrent DVT.   She denies CP.  Breathing is OK.  Active.  Shoulder is hurting.  Allergies  Allergen Reactions  . Codeine   . Penicillins Rash    Current Outpatient Prescriptions  Medication Sig Dispense Refill  . ALPRAZolam (XANAX) 0.5 MG tablet Take 0.5 mg by mouth at bedtime.       Marland Kitchen amLODipine (NORVASC) 5 MG tablet Take 5 mg by mouth daily.      Marland Kitchen aspirin EC 81 MG tablet Take 81 mg by mouth daily.       Marland Kitchen buPROPion (WELLBUTRIN XL) 150 MG 24 hr tablet Take 150 mg by mouth daily.      . citalopram (CELEXA) 40 MG tablet Take 40 mg by mouth 3 (three) times daily.       . cyclobenzaprine (FLEXERIL) 10 MG tablet Take 10 mg by mouth at bedtime.       . furosemide (LASIX) 80 MG tablet Take 80 mg by mouth 2 (two) times daily.       . isosorbide mononitrate (IMDUR) 60 MG 24 hr tablet Take 60 mg by mouth at bedtime.      Marland Kitchen levothyroxine (SYNTHROID, LEVOTHROID) 137 MCG tablet Take 137 mcg by mouth daily.       Marland Kitchen lisinopril (PRINIVIL,ZESTRIL) 20 MG tablet Take 20 mg by mouth daily.       . nitroGLYCERIN (NITROSTAT) 0.4 MG SL tablet Place 0.4 mg under the tongue every 5 (five) minutes as needed. For chest pain      . omeprazole (PRILOSEC) 40 MG capsule Take 40 mg by mouth daily.       Marland Kitchen oxyCODONE-acetaminophen (PERCOCET) 10-325 MG per tablet Take 1 tablet by mouth every 6 (six) hours as needed. For pain      . pilocarpine  (PILOCAR) 0.5 % ophthalmic solution Place 1 drop into both eyes 2 (two) times daily.       . potassium chloride (KLOR-CON) 10 MEQ CR tablet Take 10 mEq by mouth daily.       . pramipexole (MIRAPEX) 0.25 MG tablet Take 0.25 mg by mouth at bedtime.       . pravastatin (PRAVACHOL) 80 MG tablet Take 80 mg by mouth daily.       . rivaroxaban (XARELTO) 10 MG TABS tablet Take 20 mg by mouth daily.      Marland Kitchen tiotropium (SPIRIVA) 18 MCG inhalation capsule Place 18 mcg into inhaler and inhale daily.        Past Medical History  Diagnosis Date  . Hypertension   . PVD (peripheral vascular disease)   . COPD (chronic obstructive pulmonary disease)     Emphysema dxed by Dr. Sherril Croon in [place] based on PFTs per pt in 2006; placed on albuterol  . HLD (hyperlipidemia)   . Osteoarthritis   . DVT of  lower extremity (deep venous thrombosis)     recurrent. bilateral (2 episodes)  . Arthralgia     NOS  . Obesity (BMI 30-39.9) 2009    BMI 33  . Hyperthyroidism   . Snoring   . SOB (shortness of breath)   . CAD (coronary artery disease)     s/p CABG 2004; s/p DES to LM in 2010;  LHC 10/29/11: EF 50-55%, mild elevated filling pressures, no pulmonary HTN, LM 90% ISR, LAD and CFX occluded, S-RI occluded (old), S-OM3 ok and L-LAD ok, native nondominant RCA 95% -  med rx recommended   . Carotid artery occlusion     moderate    Past Surgical History  Procedure Date  . Back surgery 1986    lower; another scheduled for later 2009  . Ankle surgery 2004  . Groin mass open biopsy 2004  . Neck surgery 12/08    Due for repeat neck surgery  . Breast enhancement surgery   . Total hip arthroplasty 2007    Right  . Coronary artery bypass graft 2004  . Cardiac catheterization 11/2009    Patent LIMA to LAD and patent SVG to OM1. Occluded SVG to ramus and diagonal. Left main: 90% ostial stenosis, LCX 60-70% proximal stenosis  . Coronary angioplasty with stent placement 11/2009    Drug eluting stent to left main artery:  4.0 X 12 mm Ion     Family History  Problem Relation Age of Onset  . Cancer    . Heart disease    . Colitis    . Cancer Mother     Ovarian  . Cancer Father     Lung    History   Social History  . Marital Status: Married    Spouse Name: N/A    Number of Children: 3  . Years of Education: N/A   Occupational History  . retired     International aid/development worker  .     Social History Main Topics  . Smoking status: Former Smoker -- 45 years    Quit date: 12/22/2002  . Smokeless tobacco: Not on file  . Alcohol Use: No  . Drug Use: No  . Sexually Active: Not on file   Other Topics Concern  . Not on file   Social History Narrative   Married 52 years, lives with husband.3 grown children.    Review of Systems:  All systems reviewed.  They are negative to the above problem except as previously stated.  Vital Signs: BP 149/75  Pulse 87  Ht 5\' 9"  (1.753 m)  Wt 176 lb 6.4 oz (80.015 kg)  BMI 26.05 kg/m2  Physical Exam Patient is in NAD HEENT:  Normocephalic, atraumatic. EOMI, PERRLA.  Neck: JVP is normal. No thyromegaly. No bruits.  Lungs: clear to auscultation. No rales no wheezes.  Heart: Regular rate and rhythm. Normal S1, S2. No S3.   No significant murmurs. PMI not displaced.  Abdomen:  Supple, nontender. Normal bowel sounds. No masses. No hepatomegaly.  Extremities:   Good distal pulses throughout. No lower extremity edema.  Musculoskeletal :moving all extremities.  Neuro:   alert and oriented x3.  CN II-XII grossly intact.  EKG:  SR.  82 bpm.  LAFB     Assessment and Plan:

## 2012-02-16 NOTE — Telephone Encounter (Signed)
Seen in clinic on 02/16/2012.

## 2012-02-17 ENCOUNTER — Other Ambulatory Visit (HOSPITAL_COMMUNITY): Payer: Self-pay | Admitting: *Deleted

## 2012-02-18 ENCOUNTER — Inpatient Hospital Stay (HOSPITAL_COMMUNITY): Admission: RE | Admit: 2012-02-18 | Payer: Medicare Other | Source: Ambulatory Visit

## 2012-02-18 NOTE — Assessment & Plan Note (Signed)
Continue meds. 

## 2012-02-18 NOTE — Assessment & Plan Note (Signed)
Keep on pravastatin. 

## 2012-02-18 NOTE — Assessment & Plan Note (Signed)
Patient with CAD but currently asymptomatic with and without activity.  She is at a rel low and acceptable risk for upcoming shoulder surgery.  OK to proceed without furhter testing.

## 2012-02-18 NOTE — Assessment & Plan Note (Signed)
Patient has had 2 DVTs both appear to be after surgeries (back, knee)  She has had other surgeries as well without problem.   Never had a PE I will need to review. Note that this is not an indication for long term Xarelto. For now.   Keep on it  Stop after dose Monday night.

## 2012-02-23 ENCOUNTER — Encounter (HOSPITAL_COMMUNITY): Payer: Self-pay

## 2012-02-23 ENCOUNTER — Other Ambulatory Visit (HOSPITAL_COMMUNITY): Payer: Self-pay | Admitting: *Deleted

## 2012-02-23 ENCOUNTER — Encounter (HOSPITAL_COMMUNITY)
Admission: RE | Admit: 2012-02-23 | Discharge: 2012-02-23 | Disposition: A | Discharge status: Home / Self Care | Payer: Medicare Other | Source: Ambulatory Visit | Attending: Orthopedic Surgery | Admitting: Orthopedic Surgery

## 2012-02-23 HISTORY — DX: Heart failure, unspecified: I50.9

## 2012-02-23 HISTORY — DX: Sleep apnea, unspecified: G47.30

## 2012-02-23 HISTORY — DX: Gastro-esophageal reflux disease without esophagitis: K21.9

## 2012-02-23 HISTORY — DX: Depression, unspecified: F32.A

## 2012-02-23 HISTORY — DX: Major depressive disorder, single episode, unspecified: F32.9

## 2012-02-23 HISTORY — DX: Encounter for other specified aftercare: Z51.89

## 2012-02-23 LAB — BASIC METABOLIC PANEL
BUN: 23 mg/dL (ref 6–23)
CO2: 29 mEq/L (ref 19–32)
Calcium: 9.6 mg/dL (ref 8.4–10.5)
Chloride: 103 mEq/L (ref 96–112)
Creatinine, Ser: 0.95 mg/dL (ref 0.50–1.10)
GFR calc Af Amer: 68 mL/min — ABNORMAL LOW (ref >90)
GFR calc non Af Amer: 59 mL/min — ABNORMAL LOW (ref >90)
Glucose, Bld: 96 mg/dL (ref 70–99)
Potassium: 3.7 mEq/L (ref 3.5–5.1)
Sodium: 141 mEq/L (ref 135–145)

## 2012-02-23 LAB — CBC
HCT: 35.1 % — ABNORMAL LOW (ref 36.0–46.0)
Hemoglobin: 11.6 g/dL — ABNORMAL LOW (ref 12.0–15.0)
MCH: 29.5 pg (ref 26.0–34.0)
MCHC: 33 g/dL (ref 30.0–36.0)
MCV: 89.3 fL (ref 78.0–100.0)
Platelets: 253 10*3/uL (ref 150–400)
RBC: 3.93 MIL/uL (ref 3.87–5.11)
RDW: 14.6 % (ref 11.5–15.5)
WBC: 8.7 10*3/uL (ref 4.0–10.5)

## 2012-02-23 LAB — SURGICAL PCR SCREEN
MRSA, PCR: POSITIVE — AB
Staphylococcus aureus: POSITIVE — AB

## 2012-02-23 MED ORDER — CHLORHEXIDINE GLUCONATE 4 % EX LIQD: 60.0000 mL | Freq: Once | CUTANEOUS | Status: DC

## 2012-02-23 MED ORDER — CEFAZOLIN SODIUM-DEXTROSE 2-3 GM-% IV SOLR: 2.0000 g | INTRAVENOUS | Status: DC

## 2012-02-23 NOTE — Pre-Procedure Instructions (Signed)
[name]  02/23/2012   Your procedure is scheduled on:  02/26/2012  THURSDAY  Report to [facility] AT 1100 AM.  Call this number if you have problems the morning of surgery: [phone]   Remember:   Do not eat food:After Midnight.  May have clear liquids: up to 4 Hours before arrival.DO NOT DRINK AFTER 700AM THE DAY OF SURGERY.   Clear liquids include soda, tea, black coffee, apple or grape juice, broth.  Take these medicines the morning of surgery with A SIP OF WATER: NORVASC  WELLBUTRIN  CELEXA  IMDUR  SPIRIVA   PRILOSEC  SYNTHROID   USE NITROGLYCERIN TABS IF YOU HAVE CHEST PAIN .   Do not wear jewelry, make-up or nail polish.  Do not wear lotions, powders, or perfumes. You may wear deodorant.  Do not shave 48 hours prior to surgery.  Do not bring valuables to the hospital.  Contacts, dentures or bridgework may not be worn into surgery.  Leave suitcase in the car. After surgery it may be brought to your room.  For patients admitted to the hospital, checkout time is 11:00 AM the day of discharge.   Patients discharged the day of surgery will not be allowed to drive home.  Name and phone number of your driver: Lavenia Atlas   [phone]  Special Instructions: CHG Shower Use Special Wash: 1/2 bottle night before surgery and 1/2 bottle morning of surgery.   Please read over the following fact sheets that you were given: Pain Booklet, Coughing and Deep Breathing, MRSA Information and Surgical Site Infection Prevention

## 2012-02-25 DIAGNOSIS — I80299 Phlebitis and thrombophlebitis of other deep vessels of unspecified lower extremity: Secondary | ICD-10-CM | POA: Diagnosis not present

## 2012-02-25 DIAGNOSIS — I10 Essential (primary) hypertension: Secondary | ICD-10-CM | POA: Diagnosis not present

## 2012-02-25 MED ORDER — LACTATED RINGERS IV SOLN
INTRAVENOUS | Status: DC
  Administered 2012-02-26: 12:00:00 via INTRAVENOUS

## 2012-02-26 ENCOUNTER — Encounter (HOSPITAL_COMMUNITY): Payer: Self-pay | Admitting: Anesthesiology

## 2012-02-26 ENCOUNTER — Inpatient Hospital Stay (HOSPITAL_COMMUNITY)
Admission: RE | Admit: 2012-02-26 | Discharge: 2012-02-27 | DRG: 484 | Disposition: A | Discharge status: Home Health | Payer: Medicare Other | Source: Ambulatory Visit | Attending: Orthopedic Surgery | Admitting: Orthopedic Surgery

## 2012-02-26 ENCOUNTER — Encounter (HOSPITAL_COMMUNITY): Payer: Self-pay | Admitting: *Deleted

## 2012-02-26 ENCOUNTER — Encounter (HOSPITAL_COMMUNITY)
Admission: RE | Disposition: A | Discharge status: Home Health | Payer: Self-pay | Source: Ambulatory Visit | Attending: Orthopedic Surgery

## 2012-02-26 ENCOUNTER — Ambulatory Visit (HOSPITAL_COMMUNITY): Payer: Medicare Other | Admitting: Anesthesiology

## 2012-02-26 DIAGNOSIS — I509 Heart failure, unspecified: Secondary | ICD-10-CM | POA: Diagnosis present

## 2012-02-26 DIAGNOSIS — Z9849 Cataract extraction status, unspecified eye: Secondary | ICD-10-CM

## 2012-02-26 DIAGNOSIS — M25519 Pain in unspecified shoulder: Secondary | ICD-10-CM | POA: Diagnosis not present

## 2012-02-26 DIAGNOSIS — J4489 Other specified chronic obstructive pulmonary disease: Secondary | ICD-10-CM | POA: Diagnosis not present

## 2012-02-26 DIAGNOSIS — Z96659 Presence of unspecified artificial knee joint: Secondary | ICD-10-CM

## 2012-02-26 DIAGNOSIS — I739 Peripheral vascular disease, unspecified: Secondary | ICD-10-CM | POA: Diagnosis present

## 2012-02-26 DIAGNOSIS — F329 Major depressive disorder, single episode, unspecified: Secondary | ICD-10-CM | POA: Diagnosis present

## 2012-02-26 DIAGNOSIS — Z951 Presence of aortocoronary bypass graft: Secondary | ICD-10-CM | POA: Diagnosis not present

## 2012-02-26 DIAGNOSIS — Z6833 Body mass index (BMI) 33.0-33.9, adult: Secondary | ICD-10-CM

## 2012-02-26 DIAGNOSIS — Z86718 Personal history of other venous thrombosis and embolism: Secondary | ICD-10-CM

## 2012-02-26 DIAGNOSIS — J449 Chronic obstructive pulmonary disease, unspecified: Secondary | ICD-10-CM | POA: Diagnosis present

## 2012-02-26 DIAGNOSIS — E785 Hyperlipidemia, unspecified: Secondary | ICD-10-CM | POA: Diagnosis present

## 2012-02-26 DIAGNOSIS — Z96649 Presence of unspecified artificial hip joint: Secondary | ICD-10-CM | POA: Diagnosis not present

## 2012-02-26 DIAGNOSIS — G4730 Sleep apnea, unspecified: Secondary | ICD-10-CM | POA: Diagnosis present

## 2012-02-26 DIAGNOSIS — S43429A Sprain of unspecified rotator cuff capsule, initial encounter: Secondary | ICD-10-CM | POA: Diagnosis not present

## 2012-02-26 DIAGNOSIS — I2510 Atherosclerotic heart disease of native coronary artery without angina pectoris: Secondary | ICD-10-CM | POA: Diagnosis present

## 2012-02-26 DIAGNOSIS — F3289 Other specified depressive episodes: Secondary | ICD-10-CM | POA: Diagnosis present

## 2012-02-26 DIAGNOSIS — Z888 Allergy status to other drugs, medicaments and biological substances status: Secondary | ICD-10-CM

## 2012-02-26 DIAGNOSIS — I10 Essential (primary) hypertension: Secondary | ICD-10-CM | POA: Diagnosis present

## 2012-02-26 DIAGNOSIS — G8918 Other acute postprocedural pain: Secondary | ICD-10-CM | POA: Diagnosis not present

## 2012-02-26 DIAGNOSIS — E669 Obesity, unspecified: Secondary | ICD-10-CM | POA: Diagnosis present

## 2012-02-26 DIAGNOSIS — K219 Gastro-esophageal reflux disease without esophagitis: Secondary | ICD-10-CM | POA: Diagnosis present

## 2012-02-26 DIAGNOSIS — Z96619 Presence of unspecified artificial shoulder joint: Secondary | ICD-10-CM

## 2012-02-26 DIAGNOSIS — Z880 Allergy status to penicillin: Secondary | ICD-10-CM

## 2012-02-26 DIAGNOSIS — I6529 Occlusion and stenosis of unspecified carotid artery: Secondary | ICD-10-CM | POA: Diagnosis present

## 2012-02-26 DIAGNOSIS — E0590 Thyrotoxicosis, unspecified without thyrotoxic crisis or storm: Secondary | ICD-10-CM | POA: Diagnosis present

## 2012-02-26 DIAGNOSIS — Z9861 Coronary angioplasty status: Secondary | ICD-10-CM

## 2012-02-26 DIAGNOSIS — X58XXXA Exposure to other specified factors, initial encounter: Secondary | ICD-10-CM

## 2012-02-26 DIAGNOSIS — M19019 Primary osteoarthritis, unspecified shoulder: Secondary | ICD-10-CM | POA: Diagnosis not present

## 2012-02-26 DIAGNOSIS — M75110 Incomplete rotator cuff tear or rupture of unspecified shoulder, not specified as traumatic: Secondary | ICD-10-CM | POA: Diagnosis not present

## 2012-02-26 DIAGNOSIS — M1990 Unspecified osteoarthritis, unspecified site: Secondary | ICD-10-CM | POA: Diagnosis present

## 2012-02-26 HISTORY — PX: REVERSE SHOULDER ARTHROPLASTY: SHX5054

## 2012-02-26 SURGERY — ARTHROPLASTY, SHOULDER, TOTAL, REVERSE
Anesthesia: General | Site: Shoulder | Laterality: Right | Wound class: Clean

## 2012-02-26 MED ORDER — PANTOPRAZOLE SODIUM 40 MG PO TBEC
40.0000 mg | DELAYED_RELEASE_TABLET | Freq: Every day | ORAL | Status: DC
  Administered 2012-02-26: 40 mg via ORAL
  Filled 2012-02-26: qty 1

## 2012-02-26 MED ORDER — ROPIVACAINE HCL 5 MG/ML IJ SOLN
INTRAMUSCULAR | Status: DC | PRN
  Administered 2012-02-26: 150 mg via EPIDURAL

## 2012-02-26 MED ORDER — VANCOMYCIN HCL IN DEXTROSE 1-5 GM/200ML-% IV SOLN
1000.0000 mg | INTRAVENOUS | Status: DC
  Filled 2012-02-26: qty 200

## 2012-02-26 MED ORDER — SODIUM CHLORIDE 0.9 % IR SOLN
Status: DC | PRN
  Administered 2012-02-26: 1000 mL

## 2012-02-26 MED ORDER — PHENOL 1.4 % MT LIQD: 1.0000 | OROMUCOSAL | Status: DC | PRN

## 2012-02-26 MED ORDER — NEOSTIGMINE METHYLSULFATE 1 MG/ML IJ SOLN
INTRAMUSCULAR | Status: DC | PRN
Start: 2012-02-26 — End: 2012-02-26
  Administered 2012-02-26: 3 mg via INTRAVENOUS

## 2012-02-26 MED ORDER — BUPROPION HCL ER (XL) 150 MG PO TB24
150.0000 mg | ORAL_TABLET | Freq: Every day | ORAL | Status: DC
  Administered 2012-02-26 – 2012-02-27 (×2): 150 mg via ORAL
  Filled 2012-02-26 (×2): qty 1

## 2012-02-26 MED ORDER — ATORVASTATIN CALCIUM 20 MG PO TABS
20.0000 mg | ORAL_TABLET | Freq: Every day | ORAL | Status: DC
  Filled 2012-02-26: qty 1

## 2012-02-26 MED ORDER — PHENYLEPHRINE HCL 10 MG/ML IJ SOLN
INTRAMUSCULAR | Status: DC | PRN
  Administered 2012-02-26 (×4): 80 ug via INTRAVENOUS

## 2012-02-26 MED ORDER — FENTANYL CITRATE 0.05 MG/ML IJ SOLN
INTRAMUSCULAR | Status: AC
  Filled 2012-02-26: qty 2

## 2012-02-26 MED ORDER — DROPERIDOL 2.5 MG/ML IJ SOLN: 0.6250 mg | INTRAMUSCULAR | Status: DC | PRN

## 2012-02-26 MED ORDER — LACTATED RINGERS IV SOLN
INTRAVENOUS | Status: DC | PRN
  Administered 2012-02-26 (×2): via INTRAVENOUS

## 2012-02-26 MED ORDER — PILOCARPINE HCL 1 % OP SOLN
1.0000 [drp] | Freq: Two times a day (BID) | OPHTHALMIC | Status: DC
  Administered 2012-02-26 – 2012-02-27 (×2): 1 [drp] via OPHTHALMIC
  Filled 2012-02-26: qty 15

## 2012-02-26 MED ORDER — FUROSEMIDE 80 MG PO TABS
80.0000 mg | ORAL_TABLET | Freq: Two times a day (BID) | ORAL | Status: DC
  Filled 2012-02-26 (×4): qty 1

## 2012-02-26 MED ORDER — ISOSORBIDE MONONITRATE ER 60 MG PO TB24
60.0000 mg | ORAL_TABLET | Freq: Every day | ORAL | Status: DC
  Filled 2012-02-26 (×2): qty 1

## 2012-02-26 MED ORDER — SIMVASTATIN 40 MG PO TABS
40.0000 mg | ORAL_TABLET | Freq: Every day | ORAL | Status: DC
  Administered 2012-02-26: 40 mg via ORAL
  Filled 2012-02-26: qty 1

## 2012-02-26 MED ORDER — CITALOPRAM HYDROBROMIDE 40 MG PO TABS: 40.0000 mg | ORAL_TABLET | Freq: Three times a day (TID) | ORAL | Status: DC

## 2012-02-26 MED ORDER — ALPRAZOLAM 0.5 MG PO TABS
0.5000 mg | ORAL_TABLET | Freq: Every day | ORAL | Status: DC
  Administered 2012-02-26: 0.5 mg via ORAL
  Filled 2012-02-26: qty 1

## 2012-02-26 MED ORDER — CEFAZOLIN SODIUM 1-5 GM-% IV SOLN: 1.0000 g | Freq: Once | INTRAVENOUS | Status: DC

## 2012-02-26 MED ORDER — ONDANSETRON HCL 4 MG/2ML IJ SOLN
INTRAMUSCULAR | Status: DC | PRN
  Administered 2012-02-26: 4 mg via INTRAVENOUS

## 2012-02-26 MED ORDER — RIVAROXABAN 10 MG PO TABS
20.0000 mg | ORAL_TABLET | Freq: Every day | ORAL | Status: DC
  Filled 2012-02-26: qty 2

## 2012-02-26 MED ORDER — OXYCODONE HCL 5 MG PO TABS
5.0000 mg | ORAL_TABLET | ORAL | Status: DC | PRN
  Administered 2012-02-26 (×2): 5 mg via ORAL
  Administered 2012-02-27 (×4): 10 mg via ORAL
  Filled 2012-02-26: qty 2
  Filled 2012-02-26: qty 1
  Filled 2012-02-26: qty 2
  Filled 2012-02-26: qty 1
  Filled 2012-02-26 (×2): qty 2

## 2012-02-26 MED ORDER — MENTHOL 3 MG MT LOZG: 1.0000 | LOZENGE | OROMUCOSAL | Status: DC | PRN

## 2012-02-26 MED ORDER — BISACODYL 5 MG PO TBEC: 5.0000 mg | DELAYED_RELEASE_TABLET | Freq: Every day | ORAL | Status: DC | PRN

## 2012-02-26 MED ORDER — SODIUM CHLORIDE 0.9 % IV SOLN
10.0000 mg | INTRAVENOUS | Status: DC | PRN
  Administered 2012-02-26: 1 ug/min via INTRAVENOUS

## 2012-02-26 MED ORDER — FENTANYL CITRATE 0.05 MG/ML IJ SOLN
50.0000 ug | INTRAMUSCULAR | Status: DC | PRN
  Administered 2012-02-26: 100 ug via INTRAVENOUS

## 2012-02-26 MED ORDER — TIOTROPIUM BROMIDE MONOHYDRATE 18 MCG IN CAPS
18.0000 ug | ORAL_CAPSULE | Freq: Every day | RESPIRATORY_TRACT | Status: DC
  Administered 2012-02-27: 18 ug via RESPIRATORY_TRACT
  Filled 2012-02-26: qty 5

## 2012-02-26 MED ORDER — RIVAROXABAN 10 MG PO TABS
20.0000 mg | ORAL_TABLET | Freq: Every day | ORAL | Status: DC
  Administered 2012-02-26: 20 mg via ORAL
  Filled 2012-02-26: qty 2

## 2012-02-26 MED ORDER — DIPHENHYDRAMINE HCL 12.5 MG/5ML PO ELIX: 12.5000 mg | ORAL_SOLUTION | ORAL | Status: DC | PRN

## 2012-02-26 MED ORDER — METOCLOPRAMIDE HCL 10 MG PO TABS: 5.0000 mg | ORAL_TABLET | Freq: Three times a day (TID) | ORAL | Status: DC | PRN

## 2012-02-26 MED ORDER — POTASSIUM CHLORIDE CRYS ER 10 MEQ PO TBCR
10.0000 meq | EXTENDED_RELEASE_TABLET | Freq: Once | ORAL | Status: AC
  Administered 2012-02-26: 10 meq via ORAL
  Filled 2012-02-26: qty 1

## 2012-02-26 MED ORDER — HYDROMORPHONE HCL PF 1 MG/ML IJ SOLN: 0.2500 mg | INTRAMUSCULAR | Status: DC | PRN

## 2012-02-26 MED ORDER — HYDROMORPHONE HCL PF 1 MG/ML IJ SOLN
0.5000 mg | INTRAMUSCULAR | Status: DC | PRN
  Administered 2012-02-26 – 2012-02-27 (×5): 1 mg via INTRAVENOUS
  Filled 2012-02-26 (×5): qty 1

## 2012-02-26 MED ORDER — CYCLOBENZAPRINE HCL 10 MG PO TABS: 10.0000 mg | ORAL_TABLET | Freq: Every day | ORAL | Status: DC

## 2012-02-26 MED ORDER — PILOCARPINE HCL 0.5 % OP SOLN: 1.0000 [drp] | Freq: Two times a day (BID) | OPHTHALMIC | Status: DC

## 2012-02-26 MED ORDER — PRAMIPEXOLE DIHYDROCHLORIDE 0.25 MG PO TABS
0.2500 mg | ORAL_TABLET | Freq: Every day | ORAL | Status: DC
  Administered 2012-02-26: 0.25 mg via ORAL
  Filled 2012-02-26 (×2): qty 1

## 2012-02-26 MED ORDER — NITROGLYCERIN 0.4 MG SL SUBL: 0.4000 mg | SUBLINGUAL_TABLET | SUBLINGUAL | Status: DC | PRN

## 2012-02-26 MED ORDER — FENTANYL CITRATE 0.05 MG/ML IJ SOLN
INTRAMUSCULAR | Status: DC | PRN
  Administered 2012-02-26: 75 ug via INTRAVENOUS
  Administered 2012-02-26: 100 ug via INTRAVENOUS

## 2012-02-26 MED ORDER — DEXTROSE 5 % IV SOLN
500.0000 mg | Freq: Four times a day (QID) | INTRAVENOUS | Status: DC | PRN
  Filled 2012-02-26: qty 5

## 2012-02-26 MED ORDER — ONDANSETRON HCL 4 MG/2ML IJ SOLN: 4.0000 mg | Freq: Four times a day (QID) | INTRAMUSCULAR | Status: DC | PRN

## 2012-02-26 MED ORDER — AMLODIPINE BESYLATE 5 MG PO TABS
5.0000 mg | ORAL_TABLET | Freq: Every day | ORAL | Status: DC
  Administered 2012-02-27: 5 mg via ORAL
  Filled 2012-02-26: qty 1

## 2012-02-26 MED ORDER — DEXTROSE 5 % IV SOLN
500.0000 mg | INTRAVENOUS | Status: DC
  Filled 2012-02-26: qty 5

## 2012-02-26 MED ORDER — LISINOPRIL 20 MG PO TABS
20.0000 mg | ORAL_TABLET | Freq: Every day | ORAL | Status: DC
  Administered 2012-02-26 – 2012-02-27 (×2): 20 mg via ORAL
  Filled 2012-02-26 (×2): qty 1

## 2012-02-26 MED ORDER — ASPIRIN EC 81 MG PO TBEC
81.0000 mg | DELAYED_RELEASE_TABLET | Freq: Every day | ORAL | Status: DC
  Administered 2012-02-26 – 2012-02-27 (×2): 81 mg via ORAL
  Filled 2012-02-26 (×2): qty 1

## 2012-02-26 MED ORDER — ACETAMINOPHEN 650 MG RE SUPP: 650.0000 mg | Freq: Four times a day (QID) | RECTAL | Status: DC | PRN

## 2012-02-26 MED ORDER — METHOCARBAMOL 500 MG PO TABS
500.0000 mg | ORAL_TABLET | Freq: Four times a day (QID) | ORAL | Status: DC | PRN
Start: 2012-02-26 — End: 2012-02-27
  Administered 2012-02-26 – 2012-02-27 (×2): 500 mg via ORAL
  Filled 2012-02-26 (×2): qty 1

## 2012-02-26 MED ORDER — MIDAZOLAM HCL 2 MG/2ML IJ SOLN
INTRAMUSCULAR | Status: AC
  Filled 2012-02-26: qty 2

## 2012-02-26 MED ORDER — MIDAZOLAM HCL 2 MG/2ML IJ SOLN
1.0000 mg | INTRAMUSCULAR | Status: DC | PRN
  Administered 2012-02-26: 2 mg via INTRAVENOUS

## 2012-02-26 MED ORDER — PROPOFOL 10 MG/ML IV EMUL
INTRAVENOUS | Status: DC | PRN
  Administered 2012-02-26: 100 mg via INTRAVENOUS
  Administered 2012-02-26: 50 mg via INTRAVENOUS

## 2012-02-26 MED ORDER — ONDANSETRON HCL 4 MG PO TABS: 4.0000 mg | ORAL_TABLET | Freq: Four times a day (QID) | ORAL | Status: DC | PRN

## 2012-02-26 MED ORDER — ACETAMINOPHEN 325 MG PO TABS
650.0000 mg | ORAL_TABLET | Freq: Four times a day (QID) | ORAL | Status: DC | PRN
  Administered 2012-02-27: 650 mg via ORAL
  Filled 2012-02-26: qty 2

## 2012-02-26 MED ORDER — LEVOTHYROXINE SODIUM 137 MCG PO TABS
137.0000 ug | ORAL_TABLET | Freq: Every day | ORAL | Status: DC
  Administered 2012-02-26 – 2012-02-27 (×2): 137 ug via ORAL
  Filled 2012-02-26 (×3): qty 1

## 2012-02-26 MED ORDER — ROCURONIUM BROMIDE 100 MG/10ML IV SOLN
INTRAVENOUS | Status: DC | PRN
  Administered 2012-02-26: 40 mg via INTRAVENOUS

## 2012-02-26 MED ORDER — METOCLOPRAMIDE HCL 5 MG/ML IJ SOLN: 5.0000 mg | Freq: Three times a day (TID) | INTRAMUSCULAR | Status: DC | PRN

## 2012-02-26 MED ORDER — GLYCOPYRROLATE 0.2 MG/ML IJ SOLN
INTRAMUSCULAR | Status: DC | PRN
  Administered 2012-02-26: .4 mg via INTRAVENOUS

## 2012-02-26 MED ORDER — CITALOPRAM HYDROBROMIDE 40 MG PO TABS
60.0000 mg | ORAL_TABLET | Freq: Every day | ORAL | Status: DC
  Administered 2012-02-26 – 2012-02-27 (×2): 60 mg via ORAL
  Filled 2012-02-26 (×2): qty 1

## 2012-02-26 MED ORDER — VANCOMYCIN HCL IN DEXTROSE 1-5 GM/200ML-% IV SOLN
1000.0000 mg | Freq: Once | INTRAVENOUS | Status: AC
  Administered 2012-02-26: 1000 mg via INTRAVENOUS

## 2012-02-26 SURGICAL SUPPLY — 76 items
BIT DRILL 170X2.5X (BIT) IMPLANT
BIT DRL 170X2.5X (BIT)
BLADE SAW SGTL 83.5X18.5 (BLADE) ×2 IMPLANT
BRUSH FEMORAL CANAL (MISCELLANEOUS) IMPLANT
CEMENT BONE DEPUY (Cement) ×3 IMPLANT
CLOSURE STERI STRIP 1/2 X4 (GAUZE/BANDAGES/DRESSINGS) ×1 IMPLANT
CLOTH BEACON ORANGE TIMEOUT ST (SAFETY) ×2 IMPLANT
COVER SURGICAL LIGHT HANDLE (MISCELLANEOUS) ×2 IMPLANT
DRAPE INCISE IOBAN 66X45 STRL (DRAPES) ×2 IMPLANT
DRAPE SURG 17X11 SM STRL (DRAPES) ×2 IMPLANT
DRAPE U-SHAPE 47X51 STRL (DRAPES) ×2 IMPLANT
DRILL 2.5 (BIT)
DRILL BIT 7/64X5 (BIT) ×1 IMPLANT
DRSG MEPILEX BORDER 4X8 (GAUZE/BANDAGES/DRESSINGS) ×1 IMPLANT
DURAPREP 26ML APPLICATOR (WOUND CARE) ×2 IMPLANT
ELECT BLADE 4.0 EZ CLEAN MEGAD (MISCELLANEOUS) ×2
ELECT CAUTERY BLADE 6.4 (BLADE) ×2 IMPLANT
ELECT REM PT RETURN 9FT ADLT (ELECTROSURGICAL) ×2
ELECTRODE BLDE 4.0 EZ CLN MEGD (MISCELLANEOUS) IMPLANT
ELECTRODE REM PT RTRN 9FT ADLT (ELECTROSURGICAL) ×1 IMPLANT
FACESHIELD LNG OPTICON STERILE (SAFETY) ×6 IMPLANT
GLOVE BIO SURGEON STRL SZ7.5 (GLOVE) ×2 IMPLANT
GLOVE BIO SURGEON STRL SZ8 (GLOVE) ×2 IMPLANT
GLOVE ECLIPSE 6.5 STRL STRAW (GLOVE) ×1 IMPLANT
GLOVE ECLIPSE 8.5 STRL (GLOVE) ×1 IMPLANT
GLOVE EUDERMIC 7 POWDERFREE (GLOVE) ×3 IMPLANT
GLOVE SS BIOGEL STRL SZ 6.5 (GLOVE) IMPLANT
GLOVE SS BIOGEL STRL SZ 7.5 (GLOVE) ×1 IMPLANT
GLOVE SUPERSENSE BIOGEL SZ 6.5 (GLOVE) ×1
GLOVE SUPERSENSE BIOGEL SZ 7.5 (GLOVE) ×1
GLOVE SURG SS PI 8.5 STRL IVOR (GLOVE) ×1
GLOVE SURG SS PI 8.5 STRL STRW (GLOVE) IMPLANT
GOWN PREVENTION PLUS XXLARGE (GOWN DISPOSABLE) ×1 IMPLANT
GOWN STRL NON-REIN LRG LVL3 (GOWN DISPOSABLE) ×2 IMPLANT
GOWN STRL REIN XL XLG (GOWN DISPOSABLE) ×4 IMPLANT
HANDPIECE INTERPULSE COAX TIP (DISPOSABLE)
KIT BASIN OR (CUSTOM PROCEDURE TRAY) ×2 IMPLANT
KIT ROOM TURNOVER OR (KITS) ×2 IMPLANT
MANIFOLD NEPTUNE II (INSTRUMENTS) ×2 IMPLANT
NDL HYPO 25GX1X1/2 BEV (NEEDLE) IMPLANT
NDL SUT 6 .5 CRC .975X.05 MAYO (NEEDLE) IMPLANT
NEEDLE HYPO 25GX1X1/2 BEV (NEEDLE) IMPLANT
NEEDLE MAYO TAPER (NEEDLE)
NS IRRIG 1000ML POUR BTL (IV SOLUTION) ×2 IMPLANT
PACK SHOULDER (CUSTOM PROCEDURE TRAY) ×2 IMPLANT
PAD ARMBOARD 7.5X6 YLW CONV (MISCELLANEOUS) ×4 IMPLANT
PASSER SUT SWANSON 36MM LOOP (INSTRUMENTS) IMPLANT
PIN GUIDE 1.2 (PIN) IMPLANT
PIN GUIDE GLENOPHERE 1.5MX300M (PIN) IMPLANT
PIN METAGLENE 2.5 (PIN) IMPLANT
PRESSURIZER FEMORAL UNIV (MISCELLANEOUS) IMPLANT
RESTRICTOR CEMENT PE SZ 2 (Cement) ×1 IMPLANT
SET HNDPC FAN SPRY TIP SCT (DISPOSABLE) IMPLANT
SLING ARM IMMOBILIZER LRG (SOFTGOODS) ×1 IMPLANT
SLING ARM MED (CAST SUPPLIES) ×1 IMPLANT
SPONGE LAP 18X18 X RAY DECT (DISPOSABLE) ×3 IMPLANT
SPONGE LAP 4X18 X RAY DECT (DISPOSABLE) ×1 IMPLANT
STRIP CLOSURE SKIN 1/2X4 (GAUZE/BANDAGES/DRESSINGS) ×1 IMPLANT
SUCTION FRAZIER TIP 10 FR DISP (SUCTIONS) ×1 IMPLANT
SUT BONE WAX W31G (SUTURE) IMPLANT
SUT FIBERWIRE #2 38 T-5 BLUE (SUTURE) ×2
SUT MNCRL AB 3-0 PS2 18 (SUTURE) ×3 IMPLANT
SUT VIC AB 1 CT1 27 (SUTURE) ×4
SUT VIC AB 1 CT1 27XBRD ANBCTR (SUTURE) ×3 IMPLANT
SUT VIC AB 2-0 CT1 27 (SUTURE) ×4
SUT VIC AB 2-0 CT1 TAPERPNT 27 (SUTURE) ×2 IMPLANT
SUT VIC AB 2-0 SH 27 (SUTURE)
SUT VIC AB 2-0 SH 27X BRD (SUTURE) IMPLANT
SUTURE FIBERWR #2 38 T-5 BLUE (SUTURE) IMPLANT
SYR 30ML SLIP (SYRINGE) ×1 IMPLANT
SYR CONTROL 10ML LL (SYRINGE) IMPLANT
TOWEL OR 17X24 6PK STRL BLUE (TOWEL DISPOSABLE) ×2 IMPLANT
TOWEL OR 17X26 10 PK STRL BLUE (TOWEL DISPOSABLE) ×2 IMPLANT
TOWER CARTRIDGE SMART MIX (DISPOSABLE) ×1 IMPLANT
TRAY FOLEY CATH 14FR (SET/KITS/TRAYS/PACK) IMPLANT
WATER STERILE IRR 1000ML POUR (IV SOLUTION) ×2 IMPLANT

## 2012-02-26 NOTE — Op Note (Signed)
02/26/2012  2:22 PM  PATIENT:   [name]  71 y.o. female  PRE-OPERATIVE DIAGNOSIS:  right shoulder rotator cuff tear arthropathy   POST-OPERATIVE DIAGNOSIS:  Same  PROCEDURE:  Right reverse shoulder arthroplasty  SURGEON:  Shelie Lansing, Vania Rea. M.D.  ASSISTANTS: [place]uford pac   ANESTHESIA:   GET + ISB  EBL: 250  SPECIMEN:  none  Drains: none   PATIENT DISPOSITION:  PACU - hemodynamically stable.    PLAN OF CARE: Admit to inpatient   Dictation# [id number]

## 2012-02-26 NOTE — Preoperative (Signed)
Beta Blockers   Reason not to administer Beta Blockers:Not Applicable 

## 2012-02-26 NOTE — Anesthesia Postprocedure Evaluation (Signed)
Anesthesia Post Note  Patient: [name]  Procedure(s) Performed: Procedure(s) (LRB): REVERSE SHOULDER ARTHROPLASTY (Right)  Anesthesia type: general  Patient location: PACU  Post pain: Pain level controlled  Post assessment: Patient's Cardiovascular Status Stable  Last Vitals:  Filed Vitals:   02/26/12 1441  BP:   Pulse:   Temp: 36.7 C  Resp:     Post vital signs: Reviewed and stable  Level of consciousness: sedated  Complications: No apparent anesthesia complications

## 2012-02-26 NOTE — Transfer of Care (Signed)
Immediate Anesthesia Transfer of Care Note  Patient: [name]  Procedure(s) Performed: Procedure(s) (LRB): REVERSE SHOULDER ARTHROPLASTY (Right)  Patient Location: PACU  Anesthesia Type: General  Level of Consciousness: awake, alert  and oriented  Airway & Oxygen Therapy: Patient Spontanous Breathing and Patient connected to nasal cannula oxygen  Post-op Assessment: Report given to PACU RN and Post -op Vital signs reviewed and stable  Post vital signs: Reviewed and stable  Complications: No apparent anesthesia complications

## 2012-02-26 NOTE — H&P (Signed)
[name]    Chief Complaint: right shoulder rotator cuff repair arthrotatphy HPI: The patient is a 71 y.o. female with end stage right shoulder rotator cuff tear arthropathy  Past Medical History  Diagnosis Date  . Hypertension   . PVD (peripheral vascular disease)   . COPD (chronic obstructive pulmonary disease)     Emphysema dxed by Dr. Sherril Croon in [place] based on PFTs per pt in 2006; placed on albuterol  . HLD (hyperlipidemia)   . Osteoarthritis   . DVT of lower extremity (deep venous thrombosis)     recurrent. bilateral (2 episodes)  . Arthralgia     NOS  . Obesity (BMI 30-39.9) 2009    BMI 33  . Hyperthyroidism   . Snoring   . CAD (coronary artery disease)     s/p CABG 2004; s/p DES to LM in 2010;  LHC 10/29/11: EF 50-55%, mild elevated filling pressures, no pulmonary HTN, LM 90% ISR, LAD and CFX occluded, S-RI occluded (old), S-OM3 ok and L-LAD ok, native nondominant RCA 95% -  med rx recommended   . Carotid artery occlusion     moderate  . Angina     WITH EXERTION  TREATED WITH MEDS  . CHF (congestive heart failure)   . SOB (shortness of breath)     WITH EXERTION  . Blood transfusion 2011    WITH BACK SURGERY  . GERD (gastroesophageal reflux disease)   . Depression     TAKES CELEXA  AND WELBUTRIN  . Sleep apnea 2012    USED CPAP THEN  STARTED USING SPIRIVA AND  WAS ABLE TO STOP CPAP.     Past Surgical History  Procedure Date  . Back surgery 1986    lower; another scheduled for later 2009  . Ankle surgery 2004  . Groin mass open biopsy 2004  . Neck surgery 12/08    Due for repeat neck surgery  . Breast enhancement surgery   . Total hip arthroplasty 2007    Right  . Coronary artery bypass graft 2004  . Cardiac catheterization 11/2009    Patent LIMA to LAD and patent SVG to OM1. Occluded SVG to ramus and diagonal. Left main: 90% ostial stenosis, LCX 60-70% proximal stenosis  . Coronary angioplasty with stent placement 11/2009    Drug eluting stent to left  main artery: 4.0 X 12 mm Ion   . Eye surgery 2006    BILATERAL CAT EXT.   Marland Kitchen Joint replacement 2012     BILATERAL KNEES  . Joint replacement 2010     RIGHT HIP REPLACEMENT    Family History  Problem Relation Age of Onset  . Cancer    . Heart disease    . Colitis    . Cancer Mother     Ovarian  . Cancer Father     Lung    Social History:  reports that she quit smoking about 9 years ago. She does not have any smokeless tobacco history on file. She reports that she does not drink alcohol or use illicit drugs.  Allergies:  Allergies  Allergen Reactions  . Ambien Anxiety    CONFUSION   . Codeine   . Penicillins Rash    Medications Prior to Admission  Medication Dose Route Frequency Provider Last Rate Last Dose  . fentaNYL (SUBLIMAZE) injection 50-100 mcg  50-100 mcg Intravenous PRN Remonia Richter, MD      . lactated ringers infusion   Intravenous Continuous Ralene Bathe, PA 20  mL/hr at 02/26/12 1207    . midazolam (VERSED) injection 1-2 mg  1-2 mg Intravenous PRN Remonia Richter, MD      . vancomycin (VANCOCIN) IVPB 1000 mg/200 mL premix  1,000 mg Intravenous Once Senaida Lange, MD      . DISCONTD: ceFAZolin (ANCEF) IVPB 1 g/50 mL premix  1 g Intravenous Once Senaida Lange, MD       Medications Prior to Admission  Medication Sig Dispense Refill  . ALPRAZolam (XANAX) 0.5 MG tablet Take 0.5 mg by mouth at bedtime.       Marland Kitchen amLODipine (NORVASC) 5 MG tablet Take 5 mg by mouth daily.      Marland Kitchen aspirin EC 81 MG tablet Take 81 mg by mouth daily.       Marland Kitchen buPROPion (WELLBUTRIN XL) 150 MG 24 hr tablet Take 150 mg by mouth daily.      . citalopram (CELEXA) 40 MG tablet Take 40 mg by mouth 3 (three) times daily.       . cyclobenzaprine (FLEXERIL) 10 MG tablet Take 10 mg by mouth at bedtime.       . furosemide (LASIX) 80 MG tablet Take 80 mg by mouth 2 (two) times daily.       . isosorbide mononitrate (IMDUR) 60 MG 24 hr tablet Take 60 mg by mouth at bedtime.      Marland Kitchen  levothyroxine (SYNTHROID, LEVOTHROID) 137 MCG tablet Take 137 mcg by mouth daily.       Marland Kitchen lisinopril (PRINIVIL,ZESTRIL) 20 MG tablet Take 20 mg by mouth daily.       Marland Kitchen omeprazole (PRILOSEC) 40 MG capsule Take 40 mg by mouth daily.       Marland Kitchen oxyCODONE-acetaminophen (PERCOCET) 10-325 MG per tablet Take 1 tablet by mouth every 6 (six) hours as needed. For pain      . pilocarpine (PILOCAR) 0.5 % ophthalmic solution Place 1 drop into both eyes 2 (two) times daily.       . potassium chloride (KLOR-CON) 10 MEQ CR tablet Take 10 mEq by mouth daily.       . pramipexole (MIRAPEX) 0.25 MG tablet Take 0.25 mg by mouth at bedtime.       . rivaroxaban (XARELTO) 10 MG TABS tablet Take 20 mg by mouth daily.      Marland Kitchen tiotropium (SPIRIVA) 18 MCG inhalation capsule Place 18 mcg into inhaler and inhale daily.      . nitroGLYCERIN (NITROSTAT) 0.4 MG SL tablet Place 0.4 mg under the tongue every 5 (five) minutes as needed. For chest pain      . pravastatin (PRAVACHOL) 80 MG tablet Take 80 mg by mouth daily.          Physical Exam: Right shoulder examined and unchanged from office visit 01/30/12 with severe pain, weakness, and limited motion.  Vitals  Temp:  [97.9 F (36.6 C)] 97.9 F (36.6 C) (03/07 1113) Pulse Rate:  [92] 92  (03/07 1113) Resp:  [18] 18  (03/07 1113) BP: (120)/(69) 120/69 mmHg (03/07 1113) SpO2:  [97 %] 97 % (03/07 1113)  Assessment/Plan  Impression: right shoulder rotator cuff tear arthropathy  Plan of Action: Procedure(s): REVERSE SHOULDER ARTHROPLASTY  [name] M 02/26/2012, 12:08 PM

## 2012-02-26 NOTE — Anesthesia Procedure Notes (Addendum)
Anesthesia Regional Block:  Interscalene brachial plexus block  Pre-Anesthetic Checklist: ,, timeout performed, Correct Patient, Correct Site, Correct Laterality, Correct Procedure,, site marked, risks and benefits discussed, Surgical consent,  Pre-op evaluation,  At surgeon's request and post-op pain management  Laterality: Right  Prep: chloraprep       Needles:  Injection technique: Single-shot  Needle Type: Echogenic Stimulator Needle     Needle Length: 5cm 5 cm Needle Gauge: 22 and 22 G    Additional Needles:  Procedures: ultrasound guided and nerve stimulator Interscalene brachial plexus block  Nerve Stimulator or Paresthesia:  Response: bicep contraction, 0.45 mA,   Additional Responses:   Narrative:  Start time: 02/26/2012 12:28 PM End time: 02/26/2012 12:38 PM Injection made incrementally with aspirations every 5 mL.  Performed by: Personally  Anesthesiologist: J. Adonis Huguenin, MD  Additional Notes: Functioning IV was confirmed and monitors applied.  A 50mm 22ga echogenic arrow stimulator was used. Sterile prep and drape,hand hygiene and sterile gloves were used.Ultrasound guidance: relevent anatomy identified, needle position confirmed, local anesthetic spread visualized around nerve(s)., vascular puncture avoided.  Image printed for medical record.  Negative aspiration and negative test dose prior to incremental administration of local anesthetic. The patient tolerated the procedure well.  Interscalene brachial plexus block Procedure Name: Intubation Date/Time: 02/26/2012 12:53 PM Performed by: Caryn Bee Pre-anesthesia Checklist: Patient identified, Emergency Drugs available, Suction available, Patient being monitored and Timeout performed Patient Re-evaluated:Patient Re-evaluated prior to inductionOxygen Delivery Method: Circle system utilized Preoxygenation: Pre-oxygenation with 100% oxygen Intubation Type: IV induction Ventilation: Mask ventilation without  difficulty Laryngoscope Size: Mac and 3 Grade View: Grade I Tube type: Oral Tube size: 7.0 mm Number of attempts: 1 Airway Equipment and Method: Stylet Placement Confirmation: ETT inserted through vocal cords under direct vision,  positive ETCO2 and breath sounds checked- equal and bilateral Secured at: 21 cm Tube secured with: Tape Dental Injury: Teeth and Oropharynx as per pre-operative assessment

## 2012-02-26 NOTE — Anesthesia Preprocedure Evaluation (Signed)
Anesthesia Evaluation  Patient identified by MRN, date of birth, ID band Patient awake    Reviewed: Allergy & Precautions, H&P , NPO status , Patient's Chart, lab work & pertinent test results, reviewed documented beta blocker date and time   History of Anesthesia Complications (+) PONV  Airway Mallampati: II TM Distance: >3 FB     Dental  (+) Edentulous Upper, Edentulous Lower and Dental Advisory Given   Pulmonary    Pulmonary exam normal       Cardiovascular hypertension, Pt. on medications + angina with exertion + CAD and +CHF     Neuro/Psych Depression negative neurological ROS     GI/Hepatic Neg liver ROS, GERD-  Controlled and Medicated,  Endo/Other  Hyperthyroidism   Renal/GU negative Renal ROS     Musculoskeletal   Abdominal   Peds  Hematology   Anesthesia Other Findings   Reproductive/Obstetrics                           Anesthesia Physical Anesthesia Plan  ASA: III  Anesthesia Plan: General   Post-op Pain Management:    Induction: Intravenous  Airway Management Planned: Oral ETT  Additional Equipment:   Intra-op Plan:   Post-operative Plan:   Informed Consent: I have reviewed the patients History and Physical, chart, labs and discussed the procedure including the risks, benefits and alternatives for the proposed anesthesia with the patient or authorized representative who has indicated his/her understanding and acceptance.   Dental advisory given  Plan Discussed with: CRNA, Anesthesiologist and Surgeon  Anesthesia Plan Comments:         Anesthesia Quick Evaluation

## 2012-02-27 ENCOUNTER — Encounter (HOSPITAL_COMMUNITY): Payer: Self-pay | Admitting: Orthopedic Surgery

## 2012-02-27 MED ORDER — DIAZEPAM 5 MG PO TABS: 5.0000 mg | ORAL_TABLET | Freq: Four times a day (QID) | ORAL | Status: AC | PRN

## 2012-02-27 MED ORDER — HYDROMORPHONE HCL 4 MG PO TABS: 4.0000 mg | ORAL_TABLET | Freq: Four times a day (QID) | ORAL | Status: AC | PRN

## 2012-02-27 MED ORDER — OXYCODONE HCL 5 MG PO TABS: 5.0000 mg | ORAL_TABLET | ORAL | Status: AC | PRN

## 2012-02-27 MED ORDER — DIAZEPAM 5 MG PO TABS: 5.0000 mg | ORAL_TABLET | Freq: Four times a day (QID) | ORAL | Status: DC | PRN

## 2012-02-27 NOTE — Discharge Summary (Signed)
PATIENT ID:      [name]  MRN:     [id number] DOB/AGE:    1941-11-05 / 72 y.o.     DISCHARGE SUMMARY  ADMISSION DATE:    02/26/2012 DISCHARGE DATE:   02/27/2012   ADMISSION DIAGNOSIS: right shoulder rotator cuff repair arthrotatphy  (right shoulder rotator cuff repair arthroplasty)  DISCHARGE DIAGNOSIS:  right shoulder rotator cuff repair arthroplasty    ADDITIONAL DIAGNOSIS: Active Problems:  * No active hospital problems. *   Past Medical History  Diagnosis Date  . Hypertension   . PVD (peripheral vascular disease)   . COPD (chronic obstructive pulmonary disease)     Emphysema dxed by Dr. Sherril Croon in [place] based on PFTs per pt in 2006; placed on albuterol  . HLD (hyperlipidemia)   . Osteoarthritis   . DVT of lower extremity (deep venous thrombosis)     recurrent. bilateral (2 episodes)  . Arthralgia     NOS  . Obesity (BMI 30-39.9) 2009    BMI 33  . Hyperthyroidism   . Snoring   . CAD (coronary artery disease)     s/p CABG 2004; s/p DES to LM in 2010;  LHC 10/29/11: EF 50-55%, mild elevated filling pressures, no pulmonary HTN, LM 90% ISR, LAD and CFX occluded, S-RI occluded (old), S-OM3 ok and L-LAD ok, native nondominant RCA 95% -  med rx recommended   . Carotid artery occlusion     moderate  . Angina     WITH EXERTION  TREATED WITH MEDS  . CHF (congestive heart failure)   . SOB (shortness of breath)     WITH EXERTION  . Blood transfusion 2011    WITH BACK SURGERY  . GERD (gastroesophageal reflux disease)   . Depression     TAKES CELEXA  AND WELBUTRIN  . Sleep apnea 2012    USED CPAP THEN  STARTED USING SPIRIVA AND  WAS ABLE TO STOP CPAP.     PROCEDURE: Procedure(s): REVERSE SHOULDER ARTHROPLASTY on 02/26/2012  CONSULTS:   none  HISTORY:  See H&P in chart  HOSPITAL COURSE:  [name] is a 71 y.o. admitted on 02/26/2012 and found to have a diagnosis of right shoulder rotator cuff repair arthroplasty.  After appropriate laboratory studies were obtained  they  were taken to the operating room on 02/26/2012 and underwent Procedure(s): REVERSE SHOULDER ARTHROPLASTY.   They were given perioperative antibiotics:  Anti-infectives     Start     Dose/Rate Route Frequency Ordered Stop   02/26/12 1215   vancomycin (VANCOCIN) IVPB 1000 mg/200 mL premix        1,000 mg 200 mL/hr over 60 Minutes Intravenous  Once 02/26/12 1205 02/26/12 1245   02/26/12 1215   vancomycin (VANCOCIN) IVPB 1000 mg/200 mL premix  Status:  Discontinued        1,000 mg 200 mL/hr over 60 Minutes Intravenous To Surgery 02/26/12 1210 02/26/12 1601   02/26/12 1200   ceFAZolin (ANCEF) IVPB 1 g/50 mL premix  Status:  Discontinued        1 g 100 mL/hr over 30 Minutes Intravenous  Once 02/26/12 1154 02/26/12 1205        . Blood products given:none  Pt did well post operatively and had expected post op pain. Meds were adjusted and pt was stable to DC home. The remainder of the hospital course was dedicated to ambulation and strengthening.   The patient was discharged on 1 Day Post-Op in  Good condition.

## 2012-02-27 NOTE — Op Note (Signed)
NAMEMATTYE, VERDONE NO.:  [id number]  MEDICAL RECORD NO.:  [id number]  LOCATION:  5007                         FACILITY:  [facility]  PHYSICIAN:  Vania Rea. Taylan Marez, M.D.  DATE OF BIRTH:  [birth year]  DATE OF PROCEDURE:  02/26/2012 DATE OF DISCHARGE:                              OPERATIVE REPORT   PREOPERATIVE DIAGNOSIS:  End-stage right shoulder rotator cuff tear arthropathy.  POSTOPERATIVE DIAGNOSIS:  End-stage right shoulder rotator cuff tear arthropathy.  PROCEDURE:  Right shoulder reverse arthroplasty utilizing a cemented size 10/1 humeral stem with a +3 poly and a 38 eccentric glenosphere.  SURGEON:  Vania Rea. Barney Russomanno, MD  ASSISTANT:  Lucita Lora. Shuford, PA-C  ANESTHESIA:  A general endotracheal as well as an interscalene block.  BLOOD LOSS:  Minimal.  DRAINS:  None.  HISTORY:  Ms. Bircher is a 71 year old female who has had chronic and progressive increasing right shoulder pain related to her rotator cuff tear arthropathy.  Due to her ongoing pain and functional limitations, she is brought to the operating room at this time for planned right shoulder reverse arthroplasty.  Preoperatively, I counseled Ms. Kittle on treatment options as well as risks versus benefits thereof.  Possible surgical complications were reviewed including the potential for bleeding, infection, neurovascular injury, persistent pain, loss of motion, anesthetic complication, failure of the implant, and possible need for additional surgery.  She understands and accepts and agrees with our planned procedure.  PROCEDURE IN DETAIL:  After undergoing routine preop evaluation, the patient received prophylactic antibiotics and an interscalene block was established in the holding area by the Anesthesia Department.  Placed supine on the operating table, underwent smooth induction of the general endotracheal anesthesia.  Placed in the beach-chair position and appropriately padded and  protected.  The right shoulder girdle region was then sterilely prepped and draped in standard fashion.  Time-out was called.  An anterior approach to the right shoulder was made through deltopectoral incision, approximately 12 cm in length.  Skin flaps were elevated and electrocautery was used for hemostasis.  Deltopectoral interval was identified.  The cephalic vein retracted laterally with deltoid, developed bluntly and adhesions in the subacromial/subdeltoid space were divided.  There was chronic loss of the superior portions of the rotator cuff.  Subscapularis was divided and allowed to retract medially and the capsule was released from the inferior aspect of the humeral head, and the head was then delivered through the wound. Portions of the posterior cuff were maintained.  An access was then gained to the humeral medullary canal.  This was then hand reamed up to size 10.  Off the intramedullary guide, we made our humeral head metaphyseal resection with an oscillating saw.  Metal cap was placed at the cut surface of the humerus and we then exposed the glenoid with combination of Fukuda pitchfork and snake tongue retractors.  I performed a circumferential resection of the labral and capsular tissues such that the margin of the glenoid could be circumferentially visualized.  I then used the glenoid guide to place her central guidepin and off this, we then placed our reamer, then took this down to subchondral bone.  The peripheral reamer was  then used to remove bone at the margins of the glenoid and then residual soft tissue was removed with rongeur.  The central drill hole was then made and the glenoid was irrigated, and we then impacted the glenoid baseplate.  The baseplate was then transfixed utilizing locking screws inferior, superior as well as anterior with good purchase on all of these.  The posterior screw had no fixation, so we did not use screw posteriorly.  Once the screws  were placed, we then locked them with standard technique obtaining excellent fixation.  A 38 eccentric glenosphere was then placed over the glenoid baseplate and screwed into position, impacted and retightened with excellent fixation.  We then returned our attention to the proximal humerus where the trial stem was impacted at 0 degrees retroversion, and we then used the size 1 metaphyseal reamer proximally.  We then performed a trial reduction with the size 10 stem, and this showed good soft tissue balance.  The joint was dislocated, the trial stem was removed.  A cement plug was placed at the appropriate level and then the canal was irrigated and meticulously dried and cement was mixed, and at the appropriate consistency, we introduced the cement into the humeral canal and we have retrograde fashion, compressed this and then implanted our stem at 0 degrees retroversion, and all extra cement was meticulously removed.  We then performed trial reduction and the +3 polyethylene showed the best soft tissue balance.  We then removed the trial polyethylene, meticulously cleaned the stem, impacted the final +3 poly, performed a final reduction and the overall soft tissue balance, and range of motion and stability was much to our satisfaction.  The joint was copiously irrigated.  The deltopectoral interval was then reapproximated with interrupted figure-of-eight #1 Vicryl sutures.  A 2- 0 Vicryl for the subcu layer and intracuticular 3-0 Monocryl for the skin followed by Steri-Strips.  Dry dressing was applied and right arm was placed in a sling.  The patient was awakened, extubated, and taken to the recovery room in stable condition.     Vania Rea. Adasyn Mcadams, M.D.     KMS/MEDQ  D:  02/26/2012  T:  02/27/2012  Job:  [id number]

## 2012-02-27 NOTE — Evaluation (Signed)
Occupational Therapy Evaluation Patient Details Name: MAISON AGRUSA MRN: [id number] DOB: 27-Oct-1941 Today's Date: 02/27/2012  Problem List:  Patient Active Problem List  Diagnoses  . HYPERTHYROIDISM  . HYPERTENSION  . DEEP VENOUS THROMBOPHLEBITIS  . EMPHYSEMA  . C O P D  . OBSTRUCTIVE SLEEP APNEA  . SHORTNESS OF BREATH (SOB)  . SNORING  . CAD (coronary artery disease)  . Preop cardiovascular exam  . Dyslipidemia  . Chest pain  . PAD (peripheral artery disease)    Past Medical History:  Past Medical History  Diagnosis Date  . Hypertension   . PVD (peripheral vascular disease)   . COPD (chronic obstructive pulmonary disease)     Emphysema dxed by Dr. Sherril Croon in [place] based on PFTs per pt in 2006; placed on albuterol  . HLD (hyperlipidemia)   . Osteoarthritis   . DVT of lower extremity (deep venous thrombosis)     recurrent. bilateral (2 episodes)  . Arthralgia     NOS  . Obesity (BMI 30-39.9) 2009    BMI 33  . Hyperthyroidism   . Snoring   . CAD (coronary artery disease)     s/p CABG 2004; s/p DES to LM in 2010;  LHC 10/29/11: EF 50-55%, mild elevated filling pressures, no pulmonary HTN, LM 90% ISR, LAD and CFX occluded, S-RI occluded (old), S-OM3 ok and L-LAD ok, native nondominant RCA 95% -  med rx recommended   . Carotid artery occlusion     moderate  . Angina     WITH EXERTION  TREATED WITH MEDS  . CHF (congestive heart failure)   . SOB (shortness of breath)     WITH EXERTION  . Blood transfusion 2011    WITH BACK SURGERY  . GERD (gastroesophageal reflux disease)   . Depression     TAKES CELEXA  AND WELBUTRIN  . Sleep apnea 2012    USED CPAP THEN  STARTED USING SPIRIVA AND  WAS ABLE TO STOP CPAP.    Past Surgical History:  Past Surgical History  Procedure Date  . Back surgery 1986    lower; another scheduled for later 2009  . Ankle surgery 2004  . Groin mass open biopsy 2004  . Neck surgery 12/08    Due for repeat neck surgery  . Breast enhancement  surgery   . Total hip arthroplasty 2007    Right  . Coronary artery bypass graft 2004  . Cardiac catheterization 11/2009    Patent LIMA to LAD and patent SVG to OM1. Occluded SVG to ramus and diagonal. Left main: 90% ostial stenosis, LCX 60-70% proximal stenosis  . Coronary angioplasty with stent placement 11/2009    Drug eluting stent to left main artery: 4.0 X 12 mm Ion   . Eye surgery 2006    BILATERAL CAT EXT.   Marland Kitchen Joint replacement 2012     BILATERAL KNEES  . Joint replacement 2010     RIGHT HIP REPLACEMENT  . Reverse shoulder arthroplasty 02/26/2012    Procedure: REVERSE SHOULDER ARTHROPLASTY;  Surgeon: Senaida Lange, MD;  Location: [facility] OR;  Service: Orthopedics;  Laterality: Right;  RIGHT SHOULDER REVERSED ARTHROPLASTY    *Please see shadow chart for full evaluation*  OT Assessment/Plan/Recommendation  OT Assessment: Pt. Presents s/p right shoulder rotator cuff repair arthroplasty and with increased pain. All education completed with pt, pt's daughter, and pt's husband. Pt. Very limited by pain, however completed ROM Rt. UE as outlined below with max verbal cuing for breathing and encouragement.  Family very supportive and demonstrated ability to assist with PROM/AAROM exercises. Pt. Desires to go home today and pt. Is safe to do so. Will sign off acutely and defer continuation of skilled OT to [facility] services.   OT Evaluation Precautions/Restrictions  Precautions Precautions: Shoulder Type of Shoulder Precautions: Limit is pain Precaution Booklet Issued: Yes (comment) Required Braces or Orthoses: Yes Other Brace/Splint: Sling Restrictions Weight Bearing Restrictions: Yes RUE Weight Bearing: Non weight bearing Prior Functioning Home Living Lives With: Spouse Type of Home: House Home Layout: One level Home Access: Stairs to enter Entrance Stairs-Rails: Right Entrance Stairs-Number of Steps: 2 Bathroom Shower/Tub: Health visitor: Standard Home Adaptive  Equipment: Walker - rolling;Bedside commode/3-in-1 Prior Function Level of Independence: Independent with basic ADLs;Independent with transfers;Independent with gait Able to Take Stairs?: Yes Vocation: Retired   IT consultant Pendulum Exercise: AROM;10 reps;Right;Standing;Other (comment) (clockwise,counterclockwise,horizontal) Shoulder Flexion: PROM;AAROM;Right;5 reps;Supine;Limitations (~60 degrees) Shoulder ABduction: PROM;Right;5 reps;Supine;Limitations (~30 degrees) Shoulder Abduction Limitations: ~30degrees Wrist Flexion: AROM;Right;10 reps;Standing Wrist Extension: AROM;Right;5 reps;Standing Digit Composite Flexion: AROM;10 reps;Right;Standing Composite Extension: AROM;Right;10 reps;Standing Neck Flexion: AROM;5 reps;Seated Neck Extension: AROM;5 reps;Seated Neck Lateral Flexion - Right: AROM;5 reps;Seated Neck Lateral Flexion - Left: AROM;5 reps;Seated  End of Session OT - End of Session Equipment Utilized During Treatment: Gait belt;Other (comment) (rt. UE sling) Activity Tolerance: Patient limited by pain Patient left: in chair;with call bell in reach;with family/visitor present Nurse Communication: Mobility status for transfers General Behavior During Session: [facility] for tasks performed Cognition: [facility] for tasks performed   Meliton Samad, OTR/L Pager [phone] 02/27/2012, 2:17 PM

## 2012-02-27 NOTE — Progress Notes (Signed)
UR COMPLETED  

## 2012-02-28 DIAGNOSIS — IMO0001 Reserved for inherently not codable concepts without codable children: Secondary | ICD-10-CM | POA: Diagnosis not present

## 2012-02-28 DIAGNOSIS — J438 Other emphysema: Secondary | ICD-10-CM | POA: Diagnosis not present

## 2012-02-28 DIAGNOSIS — Z471 Aftercare following joint replacement surgery: Secondary | ICD-10-CM | POA: Diagnosis not present

## 2012-02-28 DIAGNOSIS — I10 Essential (primary) hypertension: Secondary | ICD-10-CM | POA: Diagnosis not present

## 2012-02-28 DIAGNOSIS — I2510 Atherosclerotic heart disease of native coronary artery without angina pectoris: Secondary | ICD-10-CM | POA: Diagnosis not present

## 2012-02-28 DIAGNOSIS — M159 Polyosteoarthritis, unspecified: Secondary | ICD-10-CM | POA: Diagnosis not present

## 2012-03-02 DIAGNOSIS — M159 Polyosteoarthritis, unspecified: Secondary | ICD-10-CM | POA: Diagnosis not present

## 2012-03-02 DIAGNOSIS — I2510 Atherosclerotic heart disease of native coronary artery without angina pectoris: Secondary | ICD-10-CM | POA: Diagnosis not present

## 2012-03-02 DIAGNOSIS — I10 Essential (primary) hypertension: Secondary | ICD-10-CM | POA: Diagnosis not present

## 2012-03-02 DIAGNOSIS — IMO0001 Reserved for inherently not codable concepts without codable children: Secondary | ICD-10-CM | POA: Diagnosis not present

## 2012-03-02 DIAGNOSIS — Z471 Aftercare following joint replacement surgery: Secondary | ICD-10-CM | POA: Diagnosis not present

## 2012-03-02 DIAGNOSIS — J438 Other emphysema: Secondary | ICD-10-CM | POA: Diagnosis not present

## 2012-03-03 DIAGNOSIS — I10 Essential (primary) hypertension: Secondary | ICD-10-CM | POA: Diagnosis not present

## 2012-03-03 DIAGNOSIS — IMO0001 Reserved for inherently not codable concepts without codable children: Secondary | ICD-10-CM | POA: Diagnosis not present

## 2012-03-03 DIAGNOSIS — Z471 Aftercare following joint replacement surgery: Secondary | ICD-10-CM | POA: Diagnosis not present

## 2012-03-03 DIAGNOSIS — M159 Polyosteoarthritis, unspecified: Secondary | ICD-10-CM | POA: Diagnosis not present

## 2012-03-03 DIAGNOSIS — J438 Other emphysema: Secondary | ICD-10-CM | POA: Diagnosis not present

## 2012-03-03 DIAGNOSIS — I2510 Atherosclerotic heart disease of native coronary artery without angina pectoris: Secondary | ICD-10-CM | POA: Diagnosis not present

## 2012-03-05 DIAGNOSIS — IMO0001 Reserved for inherently not codable concepts without codable children: Secondary | ICD-10-CM | POA: Diagnosis not present

## 2012-03-05 DIAGNOSIS — J438 Other emphysema: Secondary | ICD-10-CM | POA: Diagnosis not present

## 2012-03-05 DIAGNOSIS — Z471 Aftercare following joint replacement surgery: Secondary | ICD-10-CM | POA: Diagnosis not present

## 2012-03-05 DIAGNOSIS — M159 Polyosteoarthritis, unspecified: Secondary | ICD-10-CM | POA: Diagnosis not present

## 2012-03-05 DIAGNOSIS — I10 Essential (primary) hypertension: Secondary | ICD-10-CM | POA: Diagnosis not present

## 2012-03-05 DIAGNOSIS — I2510 Atherosclerotic heart disease of native coronary artery without angina pectoris: Secondary | ICD-10-CM | POA: Diagnosis not present

## 2012-03-08 DIAGNOSIS — IMO0001 Reserved for inherently not codable concepts without codable children: Secondary | ICD-10-CM | POA: Diagnosis not present

## 2012-03-08 DIAGNOSIS — I10 Essential (primary) hypertension: Secondary | ICD-10-CM | POA: Diagnosis not present

## 2012-03-08 DIAGNOSIS — J438 Other emphysema: Secondary | ICD-10-CM | POA: Diagnosis not present

## 2012-03-08 DIAGNOSIS — I2510 Atherosclerotic heart disease of native coronary artery without angina pectoris: Secondary | ICD-10-CM | POA: Diagnosis not present

## 2012-03-08 DIAGNOSIS — Z471 Aftercare following joint replacement surgery: Secondary | ICD-10-CM | POA: Diagnosis not present

## 2012-03-08 DIAGNOSIS — M159 Polyosteoarthritis, unspecified: Secondary | ICD-10-CM | POA: Diagnosis not present

## 2012-03-10 DIAGNOSIS — M19019 Primary osteoarthritis, unspecified shoulder: Secondary | ICD-10-CM | POA: Diagnosis not present

## 2012-03-11 DIAGNOSIS — Z471 Aftercare following joint replacement surgery: Secondary | ICD-10-CM | POA: Diagnosis not present

## 2012-03-11 DIAGNOSIS — M159 Polyosteoarthritis, unspecified: Secondary | ICD-10-CM | POA: Diagnosis not present

## 2012-03-11 DIAGNOSIS — I10 Essential (primary) hypertension: Secondary | ICD-10-CM | POA: Diagnosis not present

## 2012-03-11 DIAGNOSIS — I2510 Atherosclerotic heart disease of native coronary artery without angina pectoris: Secondary | ICD-10-CM | POA: Diagnosis not present

## 2012-03-11 DIAGNOSIS — J438 Other emphysema: Secondary | ICD-10-CM | POA: Diagnosis not present

## 2012-03-11 DIAGNOSIS — IMO0001 Reserved for inherently not codable concepts without codable children: Secondary | ICD-10-CM | POA: Diagnosis not present

## 2012-03-12 DIAGNOSIS — I2510 Atherosclerotic heart disease of native coronary artery without angina pectoris: Secondary | ICD-10-CM | POA: Diagnosis not present

## 2012-03-12 DIAGNOSIS — M159 Polyosteoarthritis, unspecified: Secondary | ICD-10-CM | POA: Diagnosis not present

## 2012-03-12 DIAGNOSIS — IMO0001 Reserved for inherently not codable concepts without codable children: Secondary | ICD-10-CM | POA: Diagnosis not present

## 2012-03-12 DIAGNOSIS — J438 Other emphysema: Secondary | ICD-10-CM | POA: Diagnosis not present

## 2012-03-12 DIAGNOSIS — I10 Essential (primary) hypertension: Secondary | ICD-10-CM | POA: Diagnosis not present

## 2012-03-12 DIAGNOSIS — Z471 Aftercare following joint replacement surgery: Secondary | ICD-10-CM | POA: Diagnosis not present

## 2012-03-15 DIAGNOSIS — Z471 Aftercare following joint replacement surgery: Secondary | ICD-10-CM | POA: Diagnosis not present

## 2012-03-15 DIAGNOSIS — J438 Other emphysema: Secondary | ICD-10-CM | POA: Diagnosis not present

## 2012-03-15 DIAGNOSIS — G4710 Hypersomnia, unspecified: Secondary | ICD-10-CM | POA: Diagnosis not present

## 2012-03-15 DIAGNOSIS — G4761 Periodic limb movement disorder: Secondary | ICD-10-CM | POA: Diagnosis not present

## 2012-03-15 DIAGNOSIS — M159 Polyosteoarthritis, unspecified: Secondary | ICD-10-CM | POA: Diagnosis not present

## 2012-03-15 DIAGNOSIS — IMO0001 Reserved for inherently not codable concepts without codable children: Secondary | ICD-10-CM | POA: Diagnosis not present

## 2012-03-15 DIAGNOSIS — R0989 Other specified symptoms and signs involving the circulatory and respiratory systems: Secondary | ICD-10-CM | POA: Diagnosis not present

## 2012-03-15 DIAGNOSIS — G4730 Sleep apnea, unspecified: Secondary | ICD-10-CM | POA: Diagnosis not present

## 2012-03-15 DIAGNOSIS — I10 Essential (primary) hypertension: Secondary | ICD-10-CM | POA: Diagnosis not present

## 2012-03-15 DIAGNOSIS — R0609 Other forms of dyspnea: Secondary | ICD-10-CM | POA: Diagnosis not present

## 2012-03-15 DIAGNOSIS — I2510 Atherosclerotic heart disease of native coronary artery without angina pectoris: Secondary | ICD-10-CM | POA: Diagnosis not present

## 2012-03-17 DIAGNOSIS — I10 Essential (primary) hypertension: Secondary | ICD-10-CM | POA: Diagnosis not present

## 2012-03-17 DIAGNOSIS — IMO0001 Reserved for inherently not codable concepts without codable children: Secondary | ICD-10-CM | POA: Diagnosis not present

## 2012-03-17 DIAGNOSIS — Z471 Aftercare following joint replacement surgery: Secondary | ICD-10-CM | POA: Diagnosis not present

## 2012-03-17 DIAGNOSIS — I2510 Atherosclerotic heart disease of native coronary artery without angina pectoris: Secondary | ICD-10-CM | POA: Diagnosis not present

## 2012-03-17 DIAGNOSIS — J438 Other emphysema: Secondary | ICD-10-CM | POA: Diagnosis not present

## 2012-03-17 DIAGNOSIS — M159 Polyosteoarthritis, unspecified: Secondary | ICD-10-CM | POA: Diagnosis not present

## 2012-03-23 DIAGNOSIS — I2510 Atherosclerotic heart disease of native coronary artery without angina pectoris: Secondary | ICD-10-CM | POA: Diagnosis not present

## 2012-03-23 DIAGNOSIS — IMO0001 Reserved for inherently not codable concepts without codable children: Secondary | ICD-10-CM | POA: Diagnosis not present

## 2012-03-23 DIAGNOSIS — I10 Essential (primary) hypertension: Secondary | ICD-10-CM | POA: Diagnosis not present

## 2012-03-23 DIAGNOSIS — Z471 Aftercare following joint replacement surgery: Secondary | ICD-10-CM | POA: Diagnosis not present

## 2012-03-23 DIAGNOSIS — J438 Other emphysema: Secondary | ICD-10-CM | POA: Diagnosis not present

## 2012-03-23 DIAGNOSIS — M159 Polyosteoarthritis, unspecified: Secondary | ICD-10-CM | POA: Diagnosis not present

## 2012-03-25 DIAGNOSIS — I2510 Atherosclerotic heart disease of native coronary artery without angina pectoris: Secondary | ICD-10-CM | POA: Diagnosis not present

## 2012-03-25 DIAGNOSIS — IMO0001 Reserved for inherently not codable concepts without codable children: Secondary | ICD-10-CM | POA: Diagnosis not present

## 2012-03-25 DIAGNOSIS — I10 Essential (primary) hypertension: Secondary | ICD-10-CM | POA: Diagnosis not present

## 2012-03-25 DIAGNOSIS — Z471 Aftercare following joint replacement surgery: Secondary | ICD-10-CM | POA: Diagnosis not present

## 2012-03-25 DIAGNOSIS — M159 Polyosteoarthritis, unspecified: Secondary | ICD-10-CM | POA: Diagnosis not present

## 2012-03-25 DIAGNOSIS — J438 Other emphysema: Secondary | ICD-10-CM | POA: Diagnosis not present

## 2012-05-04 DIAGNOSIS — G894 Chronic pain syndrome: Secondary | ICD-10-CM | POA: Diagnosis not present

## 2012-05-04 DIAGNOSIS — I80299 Phlebitis and thrombophlebitis of other deep vessels of unspecified lower extremity: Secondary | ICD-10-CM | POA: Diagnosis not present

## 2012-05-04 DIAGNOSIS — I10 Essential (primary) hypertension: Secondary | ICD-10-CM | POA: Diagnosis not present

## 2012-05-20 ENCOUNTER — Other Ambulatory Visit: Payer: Self-pay | Admitting: Neurosurgery

## 2012-05-20 DIAGNOSIS — M545 Low back pain, unspecified: Secondary | ICD-10-CM

## 2012-05-20 DIAGNOSIS — M5106 Intervertebral disc disorders with myelopathy, lumbar region: Secondary | ICD-10-CM | POA: Diagnosis not present

## 2012-05-20 DIAGNOSIS — M5414 Radiculopathy, thoracic region: Secondary | ICD-10-CM

## 2012-05-21 ENCOUNTER — Other Ambulatory Visit: Payer: Medicare Other

## 2012-05-25 ENCOUNTER — Ambulatory Visit
Admission: RE | Admit: 2012-05-25 | Discharge: 2012-05-25 | Disposition: A | Discharge status: Home / Self Care | Payer: Medicare Other | Source: Ambulatory Visit | Attending: Neurosurgery | Admitting: Neurosurgery

## 2012-05-25 DIAGNOSIS — M546 Pain in thoracic spine: Secondary | ICD-10-CM | POA: Diagnosis not present

## 2012-05-25 DIAGNOSIS — M545 Low back pain, unspecified: Secondary | ICD-10-CM | POA: Diagnosis not present

## 2012-05-25 DIAGNOSIS — M5414 Radiculopathy, thoracic region: Secondary | ICD-10-CM

## 2012-06-09 DIAGNOSIS — G4710 Hypersomnia, unspecified: Secondary | ICD-10-CM | POA: Diagnosis not present

## 2012-06-09 DIAGNOSIS — R0989 Other specified symptoms and signs involving the circulatory and respiratory systems: Secondary | ICD-10-CM | POA: Diagnosis not present

## 2012-06-09 DIAGNOSIS — R0609 Other forms of dyspnea: Secondary | ICD-10-CM | POA: Diagnosis not present

## 2012-06-09 DIAGNOSIS — G4730 Sleep apnea, unspecified: Secondary | ICD-10-CM | POA: Diagnosis not present

## 2012-06-09 DIAGNOSIS — G4761 Periodic limb movement disorder: Secondary | ICD-10-CM | POA: Diagnosis not present

## 2012-06-15 DIAGNOSIS — M5106 Intervertebral disc disorders with myelopathy, lumbar region: Secondary | ICD-10-CM | POA: Diagnosis not present

## 2012-06-23 DIAGNOSIS — M25579 Pain in unspecified ankle and joints of unspecified foot: Secondary | ICD-10-CM | POA: Diagnosis not present

## 2012-06-28 DIAGNOSIS — G4733 Obstructive sleep apnea (adult) (pediatric): Secondary | ICD-10-CM | POA: Diagnosis not present

## 2012-06-28 DIAGNOSIS — R609 Edema, unspecified: Secondary | ICD-10-CM | POA: Diagnosis not present

## 2012-06-28 DIAGNOSIS — Z87891 Personal history of nicotine dependence: Secondary | ICD-10-CM | POA: Diagnosis not present

## 2012-06-28 DIAGNOSIS — G894 Chronic pain syndrome: Secondary | ICD-10-CM | POA: Diagnosis not present

## 2012-06-28 DIAGNOSIS — E039 Hypothyroidism, unspecified: Secondary | ICD-10-CM | POA: Diagnosis not present

## 2012-06-28 DIAGNOSIS — R262 Difficulty in walking, not elsewhere classified: Secondary | ICD-10-CM | POA: Diagnosis not present

## 2012-06-28 DIAGNOSIS — J449 Chronic obstructive pulmonary disease, unspecified: Secondary | ICD-10-CM | POA: Diagnosis not present

## 2012-06-28 DIAGNOSIS — Z9981 Dependence on supplemental oxygen: Secondary | ICD-10-CM | POA: Diagnosis not present

## 2012-06-28 DIAGNOSIS — G4730 Sleep apnea, unspecified: Secondary | ICD-10-CM | POA: Diagnosis not present

## 2012-06-28 DIAGNOSIS — I200 Unstable angina: Secondary | ICD-10-CM | POA: Diagnosis not present

## 2012-06-28 DIAGNOSIS — Z86718 Personal history of other venous thrombosis and embolism: Secondary | ICD-10-CM | POA: Diagnosis not present

## 2012-06-28 DIAGNOSIS — Z951 Presence of aortocoronary bypass graft: Secondary | ICD-10-CM | POA: Diagnosis not present

## 2012-06-28 DIAGNOSIS — R0602 Shortness of breath: Secondary | ICD-10-CM | POA: Diagnosis not present

## 2012-06-28 DIAGNOSIS — I252 Old myocardial infarction: Secondary | ICD-10-CM | POA: Diagnosis not present

## 2012-06-28 DIAGNOSIS — I2510 Atherosclerotic heart disease of native coronary artery without angina pectoris: Secondary | ICD-10-CM | POA: Diagnosis not present

## 2012-06-28 DIAGNOSIS — D649 Anemia, unspecified: Secondary | ICD-10-CM | POA: Diagnosis not present

## 2012-06-28 DIAGNOSIS — R072 Precordial pain: Secondary | ICD-10-CM | POA: Diagnosis not present

## 2012-06-28 DIAGNOSIS — R079 Chest pain, unspecified: Secondary | ICD-10-CM | POA: Diagnosis not present

## 2012-06-28 DIAGNOSIS — E785 Hyperlipidemia, unspecified: Secondary | ICD-10-CM | POA: Diagnosis not present

## 2012-06-28 DIAGNOSIS — J9610 Chronic respiratory failure, unspecified whether with hypoxia or hypercapnia: Secondary | ICD-10-CM | POA: Diagnosis not present

## 2012-06-28 DIAGNOSIS — M1990 Unspecified osteoarthritis, unspecified site: Secondary | ICD-10-CM | POA: Diagnosis not present

## 2012-06-28 DIAGNOSIS — R791 Abnormal coagulation profile: Secondary | ICD-10-CM | POA: Diagnosis not present

## 2012-06-28 DIAGNOSIS — F329 Major depressive disorder, single episode, unspecified: Secondary | ICD-10-CM | POA: Diagnosis not present

## 2012-06-29 DIAGNOSIS — J9610 Chronic respiratory failure, unspecified whether with hypoxia or hypercapnia: Secondary | ICD-10-CM | POA: Diagnosis not present

## 2012-06-29 DIAGNOSIS — Z951 Presence of aortocoronary bypass graft: Secondary | ICD-10-CM | POA: Diagnosis not present

## 2012-06-29 DIAGNOSIS — G894 Chronic pain syndrome: Secondary | ICD-10-CM | POA: Diagnosis not present

## 2012-06-29 DIAGNOSIS — D649 Anemia, unspecified: Secondary | ICD-10-CM | POA: Diagnosis not present

## 2012-06-29 DIAGNOSIS — I2510 Atherosclerotic heart disease of native coronary artery without angina pectoris: Secondary | ICD-10-CM | POA: Diagnosis not present

## 2012-06-29 DIAGNOSIS — J449 Chronic obstructive pulmonary disease, unspecified: Secondary | ICD-10-CM | POA: Diagnosis not present

## 2012-06-29 DIAGNOSIS — I200 Unstable angina: Secondary | ICD-10-CM | POA: Diagnosis not present

## 2012-06-29 DIAGNOSIS — R0789 Other chest pain: Secondary | ICD-10-CM | POA: Diagnosis not present

## 2012-06-29 DIAGNOSIS — R791 Abnormal coagulation profile: Secondary | ICD-10-CM | POA: Diagnosis not present

## 2012-06-29 DIAGNOSIS — E785 Hyperlipidemia, unspecified: Secondary | ICD-10-CM | POA: Diagnosis not present

## 2012-06-29 DIAGNOSIS — I80299 Phlebitis and thrombophlebitis of other deep vessels of unspecified lower extremity: Secondary | ICD-10-CM | POA: Diagnosis not present

## 2012-06-29 DIAGNOSIS — G4733 Obstructive sleep apnea (adult) (pediatric): Secondary | ICD-10-CM | POA: Diagnosis not present

## 2012-06-30 DIAGNOSIS — J449 Chronic obstructive pulmonary disease, unspecified: Secondary | ICD-10-CM | POA: Diagnosis not present

## 2012-06-30 DIAGNOSIS — Z951 Presence of aortocoronary bypass graft: Secondary | ICD-10-CM | POA: Diagnosis not present

## 2012-06-30 DIAGNOSIS — R9431 Abnormal electrocardiogram [ECG] [EKG]: Secondary | ICD-10-CM | POA: Diagnosis not present

## 2012-06-30 DIAGNOSIS — I80299 Phlebitis and thrombophlebitis of other deep vessels of unspecified lower extremity: Secondary | ICD-10-CM | POA: Diagnosis not present

## 2012-06-30 DIAGNOSIS — I200 Unstable angina: Secondary | ICD-10-CM | POA: Diagnosis not present

## 2012-06-30 DIAGNOSIS — D649 Anemia, unspecified: Secondary | ICD-10-CM | POA: Diagnosis not present

## 2012-06-30 DIAGNOSIS — I2510 Atherosclerotic heart disease of native coronary artery without angina pectoris: Secondary | ICD-10-CM | POA: Diagnosis not present

## 2012-07-20 DIAGNOSIS — T85898A Other specified complication of other internal prosthetic devices, implants and grafts, initial encounter: Secondary | ICD-10-CM | POA: Diagnosis not present

## 2012-07-20 DIAGNOSIS — N63 Unspecified lump in unspecified breast: Secondary | ICD-10-CM | POA: Diagnosis not present

## 2012-07-23 DIAGNOSIS — G894 Chronic pain syndrome: Secondary | ICD-10-CM | POA: Diagnosis not present

## 2012-07-23 DIAGNOSIS — I2510 Atherosclerotic heart disease of native coronary artery without angina pectoris: Secondary | ICD-10-CM | POA: Diagnosis not present

## 2012-08-04 ENCOUNTER — Ambulatory Visit (INDEPENDENT_AMBULATORY_CARE_PROVIDER_SITE_OTHER): Payer: Medicare Other | Admitting: Physician Assistant

## 2012-08-04 ENCOUNTER — Encounter: Payer: Self-pay | Admitting: Physician Assistant

## 2012-08-04 ENCOUNTER — Ambulatory Visit: Payer: Medicare Other | Admitting: Physician Assistant

## 2012-08-04 VITALS — BP 124/66 | HR 97 | Ht 69.0 in | Wt 172.0 lb

## 2012-08-04 DIAGNOSIS — I25810 Atherosclerosis of coronary artery bypass graft(s) without angina pectoris: Secondary | ICD-10-CM | POA: Diagnosis not present

## 2012-08-04 DIAGNOSIS — R079 Chest pain, unspecified: Secondary | ICD-10-CM

## 2012-08-04 DIAGNOSIS — E785 Hyperlipidemia, unspecified: Secondary | ICD-10-CM | POA: Diagnosis not present

## 2012-08-04 DIAGNOSIS — I10 Essential (primary) hypertension: Secondary | ICD-10-CM

## 2012-08-04 DIAGNOSIS — J449 Chronic obstructive pulmonary disease, unspecified: Secondary | ICD-10-CM

## 2012-08-04 MED ORDER — AMLODIPINE BESYLATE 5 MG PO TABS: 5.0000 mg | ORAL_TABLET | Freq: Every day | ORAL | Status: DC

## 2012-08-04 NOTE — Progress Notes (Signed)
[address] [place], [place]  [address] Phone: [phone] Fax:  [phone]  Date:  08/04/2012   Name:  [name]   DOB:  1941-02-19   MRN:  [id number]  PCP:  Crist Fat, MD  Primary Cardiologist:  Dr. Dietrich Pates  Primary Electrophysiologist:  None    History of Present Illness: [name] is a 71 y.o. female who returns for f/u on chest pain.  She has a h/o CAD, s/p CABG in 2004 and DES to the LM in 2010 as well as a recurrent DVT on chronic coumadin, HTN, COPD, HL, carotid stenosis. Last LHC 10/29/11 with Dr. Kirke Corin: EF 50-55%, mild elevated filling pressures, no pulmonary HTN, LM 90% ISR, LAD and CFX occluded, S-RI occluded (old), S-OM3 ok and L-LAD ok, native nondominant RCA 95%. The patient was noted to have significant in-stent restenosis in the left main coronary artery. However, the left circumflex was also now occluded in the proximal segment. The SVG to OM3 was patent. There would not be significant advantage of intervening on the left main stent due to patent grafts to LAD as well as OM3. There was significant disease in the proximal right coronary artery but the vessel seemed to be nondominant. Based on this, Dr. Kirke Corin recommend continuing medical therapy for coronary artery disease and angina. Patient was placed on metoprolol and did not tolerate this.  She has been switched by her PCP to Xarelto for her DVT.  Last seen by Dr. Dietrich Pates in 2/13 and was doing well.  Echo 9/10: EF 60-65%, grade 1 diastolic dysfunction.  She was admitted to [facility] last month with chest pain. She was seen by Dr. Dulce Sellar. Lexiscan Myoview demonstrated "normal LV function, anterior attenuation and localized ischemia, inferior, basilar, mid section". Cardiac catheterization was considered but she preferred medical therapy. Ranexa was added. Overall, she feels better. She feels like Ranexa is helping. She seems to be describing exertional angina. She probably  has class 2-3 symptoms. She denies rest pain. She is under great deal of stress with her husband is now in a nursing home. She denies syncope, orthopnea, PND or significant pedal edema.  Wt Readings from Last 3 Encounters:  08/04/12 172 lb (78.019 kg)  02/23/12 173 lb 3.2 oz (78.563 kg)  02/16/12 176 lb 6.4 oz (80.015 kg)     Past Medical History  Diagnosis Date  . Hypertension   . PVD (peripheral vascular disease)   . COPD (chronic obstructive pulmonary disease)     Emphysema dxed by Dr. Sherril Croon in [place] based on PFTs per pt in 2006; placed on albuterol  . HLD (hyperlipidemia)   . Osteoarthritis   . DVT of lower extremity (deep venous thrombosis)     recurrent. bilateral (2 episodes)  . Arthralgia     NOS  . Obesity (BMI 30-39.9) 2009    BMI 33  . Hyperthyroidism   . Snoring   . CAD (coronary artery disease)     s/p CABG 2004; s/p DES to LM in 2010;  LHC 10/29/11: EF 50-55%, mild elevated filling pressures, no pulmonary HTN, LM 90% ISR, LAD and CFX occluded, S-RI occluded (old), S-OM3 ok and L-LAD ok, native nondominant RCA 95% -  med rx recommended ; Lexiscan Myoview 7/13 at [facility]: demonstrated "normal LV function, anterior attenuation and localized ischemia, inferior, basilar, mid section"  . Carotid artery occlusion     moderate  . Angina     WITH EXERTION  TREATED WITH MEDS  . CHF (congestive heart failure)   . SOB (shortness of breath)     WITH EXERTION  . Blood transfusion 2011    WITH BACK SURGERY  . GERD (gastroesophageal reflux disease)   . Depression     TAKES CELEXA  AND WELBUTRIN  . Sleep apnea 2012    USED CPAP THEN  STARTED USING SPIRIVA AND  WAS ABLE TO STOP CPAP.     Current Outpatient Prescriptions  Medication Sig Dispense Refill  . ALPRAZolam (XANAX) 0.5 MG tablet Take 0.5 mg by mouth at bedtime.       Marland Kitchen amLODipine (NORVASC) 5 MG tablet Take 5 mg by mouth daily.      Marland Kitchen aspirin EC 81 MG tablet Take 81 mg by mouth daily.       Marland Kitchen buPROPion  (WELLBUTRIN XL) 150 MG 24 hr tablet Take 150 mg by mouth daily.      . citalopram (CELEXA) 20 MG tablet Take 60 mg by mouth daily.      . cyclobenzaprine (FLEXERIL) 10 MG tablet Take 10 mg by mouth 3 (three) times daily.      . furosemide (LASIX) 80 MG tablet Take 80 mg by mouth 2 (two) times daily.       . isosorbide mononitrate (IMDUR) 60 MG 24 hr tablet Take 60 mg by mouth at bedtime.      Marland Kitchen levothyroxine (SYNTHROID, LEVOTHROID) 137 MCG tablet Take 137 mcg by mouth daily.       Marland Kitchen lisinopril (PRINIVIL,ZESTRIL) 20 MG tablet Take 20 mg by mouth daily.       . nitroGLYCERIN (NITROSTAT) 0.4 MG SL tablet Place 0.4 mg under the tongue every 5 (five) minutes as needed. For chest pain      . omeprazole (PRILOSEC) 40 MG capsule Take 40 mg by mouth daily.       . pilocarpine (PILOCAR) 0.5 % ophthalmic solution Place 1 drop into both eyes 2 (two) times daily.       . potassium chloride (KLOR-CON) 10 MEQ CR tablet Take 10 mEq by mouth daily.       . pramipexole (MIRAPEX) 0.25 MG tablet Take 0.25 mg by mouth at bedtime.       . pravastatin (PRAVACHOL) 80 MG tablet Take 80 mg by mouth daily.       . rivaroxaban (XARELTO) 10 MG TABS tablet Take 20 mg by mouth daily.      Marland Kitchen tiotropium (SPIRIVA) 18 MCG inhalation capsule Place 18 mcg into inhaler and inhale daily.        Allergies: Allergies  Allergen Reactions  . Zolpidem Tartrate Anxiety    CONFUSION   . Codeine   . Penicillins Rash    History  Substance Use Topics  . Smoking status: Former Smoker -- 45 years    Quit date: 12/22/2002  . Smokeless tobacco: Not on file  . Alcohol Use: No     ROS:  Please see the history of present illness.     All other systems reviewed and negative.   PHYSICAL EXAM: VS:  BP 124/66  Pulse 97  Ht 5\' 9"  (1.753 m)  Wt 172 lb (78.019 kg)  BMI 25.40 kg/m2 Well nourished, well developed, in no acute distress HEENT: normal Neck: no JVD Cardiac:  normal S1, S2; RRR; no murmur Lungs:  clear to auscultation  bilaterally, no wheezing, rhonchi or rales Abd: soft, nontender, no hepatomegaly Ext: no edema Skin: warm and dry Neuro:  CNs 2-12  intact, no focal abnormalities noted  EKG:  Sinus rhythm, heart rate 97, left axis deviation, poor R wave progression, nonspecific ST-T wave changes, QTC 472 ms no change from prior tracing      ASSESSMENT AND PLAN:  1. Chest Pain She has fairly stable exertional angina. This seems to be responding well to Ranexa. Since she is on Celexa, I'm not comfortable increasing the dose. Her QT is acceptable. She is already on isosorbide. She could not tolerate metoprolol. I will place her on amlodipine 5 mg a day. Continue aspirin. I will bring her back in 3 weeks to see me in followup. If her symptoms worsen or they do not continue to improve with medical therapy, we may need to consider relook catheterization.  2. Coronary Artery Disease Continue antianginal therapy as noted above. She will keep her followup with Dr. Tenny Craw in September.  3. Hypertension Controlled.  4. Hyperlipidemia Continue pravastatin.  5. COPD Continue followup with pulmonology.  Luna Glasgow, PA-C  11:31 AM 08/04/2012

## 2012-08-04 NOTE — Patient Instructions (Addendum)
Your physician has recommended you make the following change in your medication: START NORVASC 5 MG DAILY   09/02/12 @ 8:50 AM FOLLOW UP WITH SCOTT WEAVER, PAC SAME DAY DR. Tenny Craw IN THE OFFICE   KEEP APPT WITH DR. ROSS 09/17/12

## 2012-08-11 DIAGNOSIS — I10 Essential (primary) hypertension: Secondary | ICD-10-CM | POA: Diagnosis not present

## 2012-08-11 DIAGNOSIS — I2510 Atherosclerotic heart disease of native coronary artery without angina pectoris: Secondary | ICD-10-CM | POA: Diagnosis not present

## 2012-08-11 DIAGNOSIS — E039 Hypothyroidism, unspecified: Secondary | ICD-10-CM | POA: Diagnosis not present

## 2012-08-11 DIAGNOSIS — J449 Chronic obstructive pulmonary disease, unspecified: Secondary | ICD-10-CM | POA: Diagnosis not present

## 2012-08-11 DIAGNOSIS — E785 Hyperlipidemia, unspecified: Secondary | ICD-10-CM | POA: Diagnosis not present

## 2012-08-16 DIAGNOSIS — M25579 Pain in unspecified ankle and joints of unspecified foot: Secondary | ICD-10-CM | POA: Diagnosis not present

## 2012-08-16 DIAGNOSIS — M25519 Pain in unspecified shoulder: Secondary | ICD-10-CM | POA: Diagnosis not present

## 2012-08-16 DIAGNOSIS — M7989 Other specified soft tissue disorders: Secondary | ICD-10-CM | POA: Diagnosis not present

## 2012-08-25 DIAGNOSIS — M25579 Pain in unspecified ankle and joints of unspecified foot: Secondary | ICD-10-CM | POA: Diagnosis not present

## 2012-08-25 DIAGNOSIS — M729 Fibroblastic disorder, unspecified: Secondary | ICD-10-CM | POA: Diagnosis not present

## 2012-08-30 DIAGNOSIS — M25519 Pain in unspecified shoulder: Secondary | ICD-10-CM | POA: Diagnosis not present

## 2012-08-30 DIAGNOSIS — M7660 Achilles tendinitis, unspecified leg: Secondary | ICD-10-CM | POA: Diagnosis not present

## 2012-09-02 ENCOUNTER — Ambulatory Visit (INDEPENDENT_AMBULATORY_CARE_PROVIDER_SITE_OTHER): Payer: Medicare Other | Admitting: Physician Assistant

## 2012-09-02 ENCOUNTER — Encounter: Payer: Self-pay | Admitting: Physician Assistant

## 2012-09-02 VITALS — BP 142/78 | HR 72 | Ht 67.0 in | Wt 177.8 lb

## 2012-09-02 DIAGNOSIS — R079 Chest pain, unspecified: Secondary | ICD-10-CM

## 2012-09-02 DIAGNOSIS — I2510 Atherosclerotic heart disease of native coronary artery without angina pectoris: Secondary | ICD-10-CM | POA: Diagnosis not present

## 2012-09-02 DIAGNOSIS — I10 Essential (primary) hypertension: Secondary | ICD-10-CM | POA: Diagnosis not present

## 2012-09-02 NOTE — Patient Instructions (Addendum)
Your physician recommends that you schedule a follow-up appointment in: 09/17/12 @ 8:45 AM  NO CHANGES WERE MADE TODAY

## 2012-09-02 NOTE — Progress Notes (Signed)
[address] [place], [place]  [address] Phone: [phone] Fax:  [phone]  Date:  09/02/2012   Name:  [name]   DOB:  06-29-1941   MRN:  [id number]  PCP:  Crist Fat, MD  Primary Cardiologist:  Dr. Dietrich Pates  Primary Electrophysiologist:  None    History of Present Illness: [name] is a 71 y.o. female who returns for f/u on chest pain.  She has a h/o CAD, s/p CABG in 2004 and DES to the LM in 2010 as well as a recurrent DVT on chronic coumadin, HTN, COPD, HL, carotid stenosis. Last [facility] 11/12 demonstrated significant in-stent restenosis in the left main coronary artery. However, the left circumflex was also occluded in the proximal segment. The SVG to OM3 was patent. There would not be significant advantage of intervening on the left main stent due to patent grafts to LAD as well as OM3. There was significant disease in the proximal right coronary artery but the vessel seemed to be nondominant. Med Rx continued.    Admitted to [facility] 7/13 with chest pain. Lexiscan Myoview demonstrated "normal LV function, anterior attenuation and localized ischemia, inferior, basilar, mid section". Ranexa was added.  I saw her 08/04/12 in follow up.  I left her on the same dose of Ranexa due to concomitant use of Celexa. She was unable to tolerate metoprolol in the past. I placed her on amlodipine 5 mg a day. She is doing much better. She describes less angina with exertion. She denies any chest pain at rest. She denies significant dyspnea. She describes class II symptoms. She denies orthopnea, PND or edema. She denies syncope.   Past Medical History  Diagnosis Date  . Hypertension   . PVD (peripheral vascular disease)   . COPD (chronic obstructive pulmonary disease)     Emphysema dxed by Dr. Sherril Croon in [place] based on PFTs per pt in 2006; placed on albuterol  . HLD (hyperlipidemia)   . Osteoarthritis   . DVT of lower extremity (deep venous  thrombosis)     recurrent. bilateral (2 episodes)  . Arthralgia     NOS  . Obesity (BMI 30-39.9) 2009    BMI 33  . Hyperthyroidism   . CAD (coronary artery disease)     s/p CABG 2004; s/p DES to LM in 2010;  LHC 10/29/11: EF 50-55%, mild elevated filling pressures, no pulmonary HTN, LM 90% ISR, LAD and CFX occluded, S-RI occluded (old), S-OM3 ok and L-LAD ok, native nondominant RCA 95% -  med rx recommended ; Lexiscan Myoview 7/13 at [facility]: demonstrated "normal LV function, anterior attenuation and localized ischemia, inferior, basilar, mid section"  . Carotid artery occlusion     moderate  . Exertional angina     Treated with Isosorbide, Ranexa, amlodipine; intolerant to metoprolol  . Chronic diastolic heart failure     Echo 9/10: EF 60-55%, grade 1 diastolic dysfunction  . Blood transfusion 2011    WITH BACK SURGERY  . GERD (gastroesophageal reflux disease)   . Depression     TAKES CELEXA  AND WELBUTRIN  . Sleep apnea 2012    USED CPAP THEN  STARTED USING SPIRIVA AND  WAS ABLE TO STOP CPAP.     Current Outpatient Prescriptions  Medication Sig Dispense Refill  . ALPRAZolam (XANAX) 0.5 MG tablet Take 0.5 mg by mouth at bedtime.       Marland Kitchen aspirin EC 81 MG tablet Take 81 mg by  mouth daily.       . citalopram (CELEXA) 20 MG tablet Take 60 mg by mouth daily.      . cyclobenzaprine (FLEXERIL) 10 MG tablet Take 10 mg by mouth 3 (three) times daily.      . diclofenac (VOLTAREN) 50 MG EC tablet Take 50 mg by mouth 2 (two) times daily.      . furosemide (LASIX) 80 MG tablet Take 80 mg by mouth 2 (two) times daily.       . isosorbide mononitrate (IMDUR) 60 MG 24 hr tablet Take 60 mg by mouth at bedtime.      Marland Kitchen levothyroxine (SYNTHROID, LEVOTHROID) 137 MCG tablet Take 137 mcg by mouth daily.       Marland Kitchen lisinopril (PRINIVIL,ZESTRIL) 20 MG tablet Take 20 mg by mouth daily.       Marland Kitchen NABUMETONE PO Take 750 mg by mouth 2 (two) times daily.      . nitroGLYCERIN (NITROSTAT) 0.4 MG SL tablet Place  0.4 mg under the tongue every 5 (five) minutes as needed. For chest pain      . potassium chloride (KLOR-CON) 10 MEQ CR tablet Take 10 mEq by mouth daily.       . pramipexole (MIRAPEX) 0.25 MG tablet Take 0.25 mg by mouth at bedtime.       . ranolazine (RANEXA) 500 MG 12 hr tablet Take 500 mg by mouth 2 (two) times daily.      . rivaroxaban (XARELTO) 10 MG TABS tablet Take 20 mg by mouth daily.      Marland Kitchen tiotropium (SPIRIVA) 18 MCG inhalation capsule Place 18 mcg into inhaler and inhale daily.      . traZODone (DESYREL) 50 MG tablet Take 50 mg by mouth at bedtime.        Allergies: Allergies  Allergen Reactions  . Zolpidem Tartrate Anxiety    CONFUSION   . Codeine   . Penicillins Rash    History  Substance Use Topics  . Smoking status: Former Smoker -- 45 years    Quit date: 12/22/2002  . Smokeless tobacco: Not on file  . Alcohol Use: No     PHYSICAL EXAM: VS:  BP 142/78  Pulse 72  Ht 5\' 7"  (1.702 m)  Wt 177 lb 12.8 oz (80.65 kg)  BMI 27.85 kg/m2 Well nourished, well developed, in no acute distress HEENT: normal Neck: no JVD Cardiac:  normal S1, S2; RRR; no murmur Lungs:  clear to auscultation bilaterally, no wheezing, rhonchi or rales Abd: soft, nontender, no hepatomegaly Ext: no edema Skin: warm and dry Neuro:  CNs 2-12 intact, no focal abnormalities noted  EKG:  Sinus rhythm, heart rate 72, left axis deviation, QTc 446 ms  ASSESSMENT AND PLAN:  1. Chest Pain:  Exertional angina improved.  Continue current Rx.  Follow up with Dr. Dietrich Pates later this month as planned.  2. Coronary Artery Disease:  Continue current Rx.  She will keep her followup with Dr. Tenny Craw in September.  3. Hypertension:  No medications yet today.  Continue current Rx.  4. Hyperlipidemia:  Continue pravastatin.  5. COPD:  Continue followup with pulmonology.  Luna Glasgow, PA-C  8:42 AM 09/02/2012

## 2012-09-13 DIAGNOSIS — M24576 Contracture, unspecified foot: Secondary | ICD-10-CM | POA: Diagnosis not present

## 2012-09-13 DIAGNOSIS — M7660 Achilles tendinitis, unspecified leg: Secondary | ICD-10-CM | POA: Diagnosis not present

## 2012-09-13 DIAGNOSIS — M7730 Calcaneal spur, unspecified foot: Secondary | ICD-10-CM | POA: Diagnosis not present

## 2012-09-13 DIAGNOSIS — M24573 Contracture, unspecified ankle: Secondary | ICD-10-CM | POA: Diagnosis not present

## 2012-09-14 DIAGNOSIS — E559 Vitamin D deficiency, unspecified: Secondary | ICD-10-CM | POA: Diagnosis not present

## 2012-09-14 DIAGNOSIS — R0989 Other specified symptoms and signs involving the circulatory and respiratory systems: Secondary | ICD-10-CM | POA: Diagnosis not present

## 2012-09-14 DIAGNOSIS — G4710 Hypersomnia, unspecified: Secondary | ICD-10-CM | POA: Diagnosis not present

## 2012-09-14 DIAGNOSIS — G4730 Sleep apnea, unspecified: Secondary | ICD-10-CM | POA: Diagnosis not present

## 2012-09-14 DIAGNOSIS — G4761 Periodic limb movement disorder: Secondary | ICD-10-CM | POA: Diagnosis not present

## 2012-09-14 DIAGNOSIS — R0609 Other forms of dyspnea: Secondary | ICD-10-CM | POA: Diagnosis not present

## 2012-09-17 ENCOUNTER — Encounter: Payer: Self-pay | Admitting: Internal Medicine

## 2012-09-17 ENCOUNTER — Ambulatory Visit (INDEPENDENT_AMBULATORY_CARE_PROVIDER_SITE_OTHER): Payer: Medicare Other | Admitting: Internal Medicine

## 2012-09-17 VITALS — BP 149/72 | HR 80 | Ht 66.0 in | Wt 177.0 lb

## 2012-09-17 DIAGNOSIS — I25810 Atherosclerosis of coronary artery bypass graft(s) without angina pectoris: Secondary | ICD-10-CM | POA: Diagnosis not present

## 2012-09-17 DIAGNOSIS — E785 Hyperlipidemia, unspecified: Secondary | ICD-10-CM | POA: Diagnosis not present

## 2012-09-17 NOTE — Progress Notes (Signed)
HPI She has a h/o CAD, s/p CABG in 2004 and DES to the LM in 2010 as well as a recurrent DVT on chronic coumadin, HTN, COPD, HL, carotid stenosis. Last [facility] 11/12 demonstrated significant in-stent restenosis in the left main coronary artery. However, the left circumflex was also occluded in the proximal segment. The SVG to OM3 was patent. There would not be significant advantage of intervening on the left main stent due to patent grafts to LAD as well as OM3. There was significant disease in the proximal right coronary artery but the vessel seemed to be nondominant. Med Rx continued.  Admitted to [facility] 7/13 with chest pain. Lexiscan Myoview demonstrated "normal LV function, anterior attenuation and localized ischemia, inferior, basilar, mid section". Ranexa was added.She was seen by Wende Mott in [place].  Unable to tolerate metoprolol.  Norrvasc 5 added.  She was seen again by Wende Mott on 9/12.  Doing better with less angina, No CP at rest.  She continues to do OK  Denies signif chest pains.  Breathing is OK  Does say she is under increased stress because of husband's health problems.  Allergies  Allergen Reactions  . Zolpidem Tartrate Anxiety    CONFUSION   . Codeine   . Penicillins Rash    Current Outpatient Prescriptions  Medication Sig Dispense Refill  . ALPRAZolam (XANAX) 0.5 MG tablet Take 0.5 mg by mouth at bedtime.       Marland Kitchen amLODipine (NORVASC) 5 MG tablet Take 5 mg by mouth daily.      Marland Kitchen aspirin EC 81 MG tablet Take 81 mg by mouth daily.       . citalopram (CELEXA) 20 MG tablet Take 60 mg by mouth daily.      . cyclobenzaprine (FLEXERIL) 10 MG tablet Take 10 mg by mouth 3 (three) times daily.      . diclofenac (VOLTAREN) 50 MG EC tablet Take 50 mg by mouth 2 (two) times daily.      . furosemide (LASIX) 80 MG tablet Take 80 mg by mouth 2 (two) times daily.       . isosorbide mononitrate (IMDUR) 60 MG 24 hr tablet Take 60 mg by mouth at bedtime.      Marland Kitchen levothyroxine (SYNTHROID,  LEVOTHROID) 137 MCG tablet Take 137 mcg by mouth daily.       Marland Kitchen lisinopril (PRINIVIL,ZESTRIL) 20 MG tablet Take 20 mg by mouth daily.       Marland Kitchen NABUMETONE PO Take 750 mg by mouth 2 (two) times daily.      . nitroGLYCERIN (NITROSTAT) 0.4 MG SL tablet Place 0.4 mg under the tongue every 5 (five) minutes as needed. For chest pain      . potassium chloride (KLOR-CON) 10 MEQ CR tablet Take 10 mEq by mouth daily.       . pramipexole (MIRAPEX) 0.25 MG tablet Take 0.25 mg by mouth at bedtime.       . ranolazine (RANEXA) 500 MG 12 hr tablet Take 500 mg by mouth 2 (two) times daily.      . rivaroxaban (XARELTO) 10 MG TABS tablet Take 20 mg by mouth daily.      Marland Kitchen tiotropium (SPIRIVA) 18 MCG inhalation capsule Place 18 mcg into inhaler and inhale daily.      . traZODone (DESYREL) 50 MG tablet Take 50 mg by mouth at bedtime.        Past Medical History  Diagnosis Date  . Hypertension   . PVD (peripheral vascular  disease)   . COPD (chronic obstructive pulmonary disease)     Emphysema dxed by Dr. Sherril Croon in [place] based on PFTs per pt in 2006; placed on albuterol  . HLD (hyperlipidemia)   . Osteoarthritis   . DVT of lower extremity (deep venous thrombosis)     recurrent. bilateral (2 episodes)  . Arthralgia     NOS  . Obesity (BMI 30-39.9) 2009    BMI 33  . Hyperthyroidism   . CAD (coronary artery disease)     s/p CABG 2004; s/p DES to LM in 2010;  LHC 10/29/11: EF 50-55%, mild elevated filling pressures, no pulmonary HTN, LM 90% ISR, LAD and CFX occluded, S-RI occluded (old), S-OM3 ok and L-LAD ok, native nondominant RCA 95% -  med rx recommended ; Lexiscan Myoview 7/13 at [facility]: demonstrated "normal LV function, anterior attenuation and localized ischemia, inferior, basilar, mid section"  . Carotid artery occlusion     moderate  . Exertional angina     Treated with Isosorbide, Ranexa, amlodipine; intolerant to metoprolol  . Chronic diastolic heart failure     Echo 9/10: EF 60-55%, grade 1  diastolic dysfunction  . Blood transfusion 2011    WITH BACK SURGERY  . GERD (gastroesophageal reflux disease)   . Depression     TAKES CELEXA  AND WELBUTRIN  . Sleep apnea 2012    USED CPAP THEN  STARTED USING SPIRIVA AND  WAS ABLE TO STOP CPAP.     Past Surgical History  Procedure Date  . Back surgery 1986    lower; another scheduled for later 2009  . Ankle surgery 2004  . Groin mass open biopsy 2004  . Neck surgery 12/08    Due for repeat neck surgery  . Breast enhancement surgery   . Total hip arthroplasty 2007    Right  . Coronary artery bypass graft 2004  . Cardiac catheterization 11/2009    Patent LIMA to LAD and patent SVG to OM1. Occluded SVG to ramus and diagonal. Left main: 90% ostial stenosis, LCX 60-70% proximal stenosis  . Coronary angioplasty with stent placement 11/2009    Drug eluting stent to left main artery: 4.0 X 12 mm Ion   . Eye surgery 2006    BILATERAL CAT EXT.   Marland Kitchen Joint replacement 2012     BILATERAL KNEES  . Joint replacement 2010     RIGHT HIP REPLACEMENT  . Reverse shoulder arthroplasty 02/26/2012    Procedure: REVERSE SHOULDER ARTHROPLASTY;  Surgeon: Senaida Lange, MD;  Location: [facility] OR;  Service: Orthopedics;  Laterality: Right;  RIGHT SHOULDER REVERSED ARTHROPLASTY    Family History  Problem Relation Age of Onset  . Cancer    . Heart disease    . Colitis    . Cancer Mother     Ovarian  . Cancer Father     Lung    History   Social History  . Marital Status: Married    Spouse Name: N/A    Number of Children: 3  . Years of Education: N/A   Occupational History  . retired     International aid/development worker  .     Social History Main Topics  . Smoking status: Former Smoker -- 45 years    Quit date: 12/22/2002  . Smokeless tobacco: Not on file  . Alcohol Use: No  . Drug Use: No  . Sexually Active: No   Other Topics Concern  . Not on file   Social History Narrative  Married 52 years, lives with husband.3 grown children.    Review of  Systems:  All systems reviewed.  They are negative to the above problem except as previously stated.  Vital Signs: Wt 177 lb (80.287 kg) BP   149/72  P 80 Physical Exam  HEENT:  Normocephalic, atraumatic. EOMI, PERRLA.  Neck: JVP is normal.  No bruits.  Lungs: clear to auscultation. No rales no wheezes.  Heart: Regular rate and rhythm. Normal S1, S2. No S3.   No significant murmurs. PMI not displaced.  Abdomen:  Supple, nontender. Normal bowel sounds. No masses. No hepatomegaly.  Extremities:   Good distal pulses throughout. No lower extremity edema.  Musculoskeletal :moving all extremities.  Neuro:   alert and oriented x3.  CN II-XII grossly intact.   Assessment and Plan:  1.  CAD.  Doing fairly well.  I would keep on current regimen  2.  HT>  Fair control  No changes for now.  3.  PVOD  4.  Recurrent DVT  On anticoag.  I encouraged her to seek help iin caring for her husband.

## 2012-09-17 NOTE — Patient Instructions (Addendum)
**Note De-identified  Obfuscation** Your physician recommends that you continue on your current medications as directed. Please refer to the Current Medication list given to you today. Your physician wants you to follow-up in: 4 months You will receive a reminder letter in the mail two months in advance. If you don't receive a letter, please call our office to schedule the follow-up appointment.  

## 2012-09-18 DIAGNOSIS — G4730 Sleep apnea, unspecified: Secondary | ICD-10-CM | POA: Diagnosis not present

## 2012-09-18 DIAGNOSIS — G4710 Hypersomnia, unspecified: Secondary | ICD-10-CM | POA: Diagnosis not present

## 2012-09-20 DIAGNOSIS — T8549XA Other mechanical complication of breast prosthesis and implant, initial encounter: Secondary | ICD-10-CM | POA: Diagnosis not present

## 2012-09-28 DIAGNOSIS — G4761 Periodic limb movement disorder: Secondary | ICD-10-CM | POA: Diagnosis not present

## 2012-09-28 DIAGNOSIS — R0989 Other specified symptoms and signs involving the circulatory and respiratory systems: Secondary | ICD-10-CM | POA: Diagnosis not present

## 2012-09-28 DIAGNOSIS — R0609 Other forms of dyspnea: Secondary | ICD-10-CM | POA: Diagnosis not present

## 2012-09-28 DIAGNOSIS — G4730 Sleep apnea, unspecified: Secondary | ICD-10-CM | POA: Diagnosis not present

## 2012-09-28 DIAGNOSIS — E559 Vitamin D deficiency, unspecified: Secondary | ICD-10-CM | POA: Diagnosis not present

## 2012-09-28 DIAGNOSIS — G4710 Hypersomnia, unspecified: Secondary | ICD-10-CM | POA: Diagnosis not present

## 2012-11-02 DIAGNOSIS — Z23 Encounter for immunization: Secondary | ICD-10-CM | POA: Diagnosis not present

## 2012-11-02 DIAGNOSIS — L0390 Cellulitis, unspecified: Secondary | ICD-10-CM | POA: Diagnosis not present

## 2012-11-02 DIAGNOSIS — R609 Edema, unspecified: Secondary | ICD-10-CM | POA: Diagnosis not present

## 2012-11-02 DIAGNOSIS — L0291 Cutaneous abscess, unspecified: Secondary | ICD-10-CM | POA: Diagnosis not present

## 2012-11-02 DIAGNOSIS — M25819 Other specified joint disorders, unspecified shoulder: Secondary | ICD-10-CM | POA: Diagnosis not present

## 2012-11-05 DIAGNOSIS — R609 Edema, unspecified: Secondary | ICD-10-CM | POA: Diagnosis not present

## 2012-11-11 DIAGNOSIS — G4710 Hypersomnia, unspecified: Secondary | ICD-10-CM | POA: Diagnosis not present

## 2012-11-11 DIAGNOSIS — G4730 Sleep apnea, unspecified: Secondary | ICD-10-CM | POA: Diagnosis not present

## 2012-12-03 DIAGNOSIS — E559 Vitamin D deficiency, unspecified: Secondary | ICD-10-CM | POA: Diagnosis not present

## 2012-12-03 DIAGNOSIS — G4730 Sleep apnea, unspecified: Secondary | ICD-10-CM | POA: Diagnosis not present

## 2012-12-03 DIAGNOSIS — R0609 Other forms of dyspnea: Secondary | ICD-10-CM | POA: Diagnosis not present

## 2012-12-03 DIAGNOSIS — R0989 Other specified symptoms and signs involving the circulatory and respiratory systems: Secondary | ICD-10-CM | POA: Diagnosis not present

## 2012-12-03 DIAGNOSIS — G4761 Periodic limb movement disorder: Secondary | ICD-10-CM | POA: Diagnosis not present

## 2012-12-03 DIAGNOSIS — G4710 Hypersomnia, unspecified: Secondary | ICD-10-CM | POA: Diagnosis not present

## 2012-12-06 DIAGNOSIS — E559 Vitamin D deficiency, unspecified: Secondary | ICD-10-CM | POA: Diagnosis not present

## 2012-12-06 DIAGNOSIS — G4710 Hypersomnia, unspecified: Secondary | ICD-10-CM | POA: Diagnosis not present

## 2012-12-06 DIAGNOSIS — G4730 Sleep apnea, unspecified: Secondary | ICD-10-CM | POA: Diagnosis not present

## 2012-12-10 ENCOUNTER — Ambulatory Visit: Payer: Medicare Other | Admitting: Nurse Practitioner

## 2012-12-13 DIAGNOSIS — M25519 Pain in unspecified shoulder: Secondary | ICD-10-CM | POA: Diagnosis not present

## 2012-12-27 DIAGNOSIS — M25819 Other specified joint disorders, unspecified shoulder: Secondary | ICD-10-CM | POA: Diagnosis not present

## 2012-12-28 ENCOUNTER — Encounter: Payer: Self-pay | Admitting: Nurse Practitioner

## 2012-12-28 ENCOUNTER — Ambulatory Visit (INDEPENDENT_AMBULATORY_CARE_PROVIDER_SITE_OTHER): Payer: Medicare Other | Admitting: Nurse Practitioner

## 2012-12-28 VITALS — BP 158/92 | HR 54 | Ht 67.0 in | Wt 178.0 lb

## 2012-12-28 DIAGNOSIS — Z01818 Encounter for other preprocedural examination: Secondary | ICD-10-CM | POA: Diagnosis not present

## 2012-12-28 LAB — CBC WITH DIFFERENTIAL/PLATELET
Basophils Absolute: 0.1 10*3/uL (ref 0.0–0.1)
Basophils Relative: 0.6 % (ref 0.0–3.0)
Eosinophils Absolute: 0.2 10*3/uL (ref 0.0–0.7)
Eosinophils Relative: 1.7 % (ref 0.0–5.0)
HCT: 34.8 % — ABNORMAL LOW (ref 36.0–46.0)
Hemoglobin: 11.6 g/dL — ABNORMAL LOW (ref 12.0–15.0)
Lymphocytes Relative: 24.5 % (ref 12.0–46.0)
Lymphs Abs: 2.2 10*3/uL (ref 0.7–4.0)
MCHC: 33.2 g/dL (ref 30.0–36.0)
MCV: 94.5 fl (ref 78.0–100.0)
Monocytes Absolute: 0.9 10*3/uL (ref 0.1–1.0)
Monocytes Relative: 9.9 % (ref 3.0–12.0)
Neutro Abs: 5.6 10*3/uL (ref 1.4–7.7)
Neutrophils Relative %: 63.3 % (ref 43.0–77.0)
Platelets: 261 10*3/uL (ref 150.0–400.0)
RBC: 3.69 Mil/uL — ABNORMAL LOW (ref 3.87–5.11)
RDW: 14.1 % (ref 11.5–14.6)
WBC: 8.9 10*3/uL (ref 4.5–10.5)

## 2012-12-28 LAB — BASIC METABOLIC PANEL
BUN: 13 mg/dL (ref 6–23)
CO2: 27 mEq/L (ref 19–32)
Calcium: 9.3 mg/dL (ref 8.4–10.5)
Chloride: 105 mEq/L (ref 96–112)
Creatinine, Ser: 0.9 mg/dL (ref 0.4–1.2)
GFR: 65.34 mL/min (ref >60.00)
Glucose, Bld: 84 mg/dL (ref 70–99)
Potassium: 3.9 mEq/L (ref 3.5–5.1)
Sodium: 140 mEq/L (ref 135–145)

## 2012-12-28 MED ORDER — AMLODIPINE BESYLATE 5 MG PO TABS: 5.0000 mg | ORAL_TABLET | Freq: Two times a day (BID) | ORAL | Status: DC

## 2012-12-28 NOTE — Patient Instructions (Addendum)
Stop your Voltaren and your nabumetone  Use Tylenol arthritis instead  Talk to your children about getting help/support with caring for your husband  Increase your Norvasc (amlodipine) to two times a day  Stay on all your other medicines  See Dr. Tenny Craw in a month  For now, put the plans for surgery on hold.  Call the [facility] office at [phone] if you have any questions, problems or concerns.

## 2012-12-28 NOTE — Progress Notes (Signed)
Georgina Quint Date of Birth: Jun 03, 1941 Medical Record #[id number]  History of Present Illness: Ms. Behan is seen back today for a surgical clearance visit. She is seen for Dr. Tenny Craw. She has multiple medical issues which include a h/o CAD, s/p CABG in 2004 and DES to the LM in 2010 as well as a recurrent DVT on chronic coumadin, HTN, COPD, HL, carotid stenosis. Last [facility] 11/12 demonstrated significant in-stent restenosis in the left main coronary artery. However, the left circumflex was also occluded in the proximal segment. The SVG to OM3 was patent. There would not be significant advantage of intervening on the left main stent due to patent grafts to LAD as well as OM3. There was significant disease in the proximal right coronary artery but the vessel seemed to be nondominant. Med Rx continued.   Admitted to [facility] 7/13 with chest pain. Lexiscan Myoview demonstrated "normal LV function, anterior attenuation and localized ischemia, inferior, basilar, mid section". Ranexa was added. Unable to tolerate metoprolol. Norrvasc 5 added. She was seen again by Wende Mott on 9/12. Doing better with less angina, No CP at rest. Last seen by Dr. Tenny Craw in September and was felt to be doing better with less angina and no chest pain at rest.   She comes in today. She is here alone. She is considering shoulder/rotator cuff surgery with Dr. Rennis Chris. It is not scheduled. She is not sure she wants to proceed. She continues to have chest pain with minimal exertion. She says she is very limited by her breathing and her chest pain as well as her arthritis. She is quite bruised. She feels like she is on too much blood thinner. She is on Xarelto but also on Voltaren and Nabumetone. No falls. She remains very stressed in caring for her husband with dementia. BP at home in the mornings is about 150/70. She is the sole care giver. Has not asked her kids for any help.   Current Outpatient Prescriptions on File Prior to  Visit  Medication Sig Dispense Refill  . ALPRAZolam (XANAX) 0.5 MG tablet Take 0.5 mg by mouth at bedtime.       Marland Kitchen aspirin EC 81 MG tablet Take 81 mg by mouth daily.       . citalopram (CELEXA) 20 MG tablet Take 60 mg by mouth daily.      . cyclobenzaprine (FLEXERIL) 10 MG tablet Take 10 mg by mouth 3 (three) times daily.      . furosemide (LASIX) 80 MG tablet Take 80 mg by mouth 2 (two) times daily.       . isosorbide mononitrate (IMDUR) 60 MG 24 hr tablet Take 60 mg by mouth at bedtime.      Marland Kitchen levothyroxine (SYNTHROID, LEVOTHROID) 137 MCG tablet Take 137 mcg by mouth daily.       Marland Kitchen lisinopril (PRINIVIL,ZESTRIL) 20 MG tablet Take 20 mg by mouth daily.       . nitroGLYCERIN (NITROSTAT) 0.4 MG SL tablet Place 0.4 mg under the tongue every 5 (five) minutes as needed. For chest pain      . potassium chloride (KLOR-CON) 10 MEQ CR tablet Take 10 mEq by mouth daily.       . pramipexole (MIRAPEX) 0.25 MG tablet Take 0.25 mg by mouth at bedtime.       . ranolazine (RANEXA) 500 MG 12 hr tablet Take 500 mg by mouth 2 (two) times daily.      . rivaroxaban (XARELTO) 10 MG TABS tablet  Take 20 mg by mouth daily.      Marland Kitchen tiotropium (SPIRIVA) 18 MCG inhalation capsule Place 18 mcg into inhaler and inhale daily.      . traZODone (DESYREL) 50 MG tablet Take 50 mg by mouth at bedtime.        Allergies  Allergen Reactions  . Zolpidem Tartrate Anxiety    CONFUSION   . Codeine   . Penicillins Rash    Past Medical History  Diagnosis Date  . Hypertension   . PVD (peripheral vascular disease)   . COPD (chronic obstructive pulmonary disease)     Emphysema dxed by Dr. Sherril Croon in [place] based on PFTs per pt in 2006; placed on albuterol  . HLD (hyperlipidemia)   . Osteoarthritis   . DVT of lower extremity (deep venous thrombosis)     recurrent. bilateral (2 episodes)  . Arthralgia     NOS  . Obesity (BMI 30-39.9) 2009    BMI 33  . Hyperthyroidism   . CAD (coronary artery disease)     s/p CABG 2004; s/p  DES to LM in 2010;  LHC 10/29/11: EF 50-55%, mild elevated filling pressures, no pulmonary HTN, LM 90% ISR, LAD and CFX occluded, S-RI occluded (old), S-OM3 ok and L-LAD ok, native nondominant RCA 95% -  med rx recommended ; Lexiscan Myoview 7/13 at [facility]: demonstrated "normal LV function, anterior attenuation and localized ischemia, inferior, basilar, mid section"  . Carotid artery occlusion     moderate  . Exertional angina     Treated with Isosorbide, Ranexa, amlodipine; intolerant to metoprolol  . Chronic diastolic heart failure     Echo 9/10: EF 60-55%, grade 1 diastolic dysfunction  . Blood transfusion 2011    WITH BACK SURGERY  . GERD (gastroesophageal reflux disease)   . Depression     TAKES CELEXA  AND WELBUTRIN  . Sleep apnea 2012    USED CPAP THEN  STARTED USING SPIRIVA AND  WAS ABLE TO STOP CPAP.     Past Surgical History  Procedure Date  . Back surgery 1986    lower; another scheduled for later 2009  . Ankle surgery 2004  . Groin mass open biopsy 2004  . Neck surgery 12/08    Due for repeat neck surgery  . Breast enhancement surgery   . Total hip arthroplasty 2007    Right  . Coronary artery bypass graft 2004  . Cardiac catheterization 11/2009    Patent LIMA to LAD and patent SVG to OM1. Occluded SVG to ramus and diagonal. Left main: 90% ostial stenosis, LCX 60-70% proximal stenosis  . Coronary angioplasty with stent placement 11/2009    Drug eluting stent to left main artery: 4.0 X 12 mm Ion   . Eye surgery 2006    BILATERAL CAT EXT.   Marland Kitchen Joint replacement 2012     BILATERAL KNEES  . Joint replacement 2010     RIGHT HIP REPLACEMENT  . Reverse shoulder arthroplasty 02/26/2012    Procedure: REVERSE SHOULDER ARTHROPLASTY;  Surgeon: Senaida Lange, MD;  Location: [facility] OR;  Service: Orthopedics;  Laterality: Right;  RIGHT SHOULDER REVERSED ARTHROPLASTY    History  Smoking status  . Former Smoker -- 45 years  . Quit date: 12/22/2002  Smokeless tobacco  . Not  on file    History  Alcohol Use No    Family History  Problem Relation Age of Onset  . Cancer    . Heart disease    . Colitis    .  Cancer Mother     Ovarian  . Cancer Father     Lung    Review of Systems: The review of systems is per the HPI.  All other systems were reviewed and are negative.  Physical Exam: BP 158/92  Pulse 54  Ht 5\' 7"  (1.702 m)  Wt 178 lb (80.74 kg)  BMI 27.88 kg/m2 Patient is very pleasant and in no acute distress. She has a shoulder deformity and does not stand straight. Skin is warm and dry. Color is normal.  Extensive bruising of the left upper arm noted.  HEENT is unremarkable. Normocephalic/atraumatic. PERRL. Sclera are nonicteric. Neck is supple. No masses. No JVD. Lungs are clear. Cardiac exam shows a regular rate and rhythm. Abdomen is soft. Extremities are with 1+ edema on the right (former site of DVT). None in the left. Gait and ROM are intact. No gross neurologic deficits noted.   LABORATORY DATA: EKG today shows sinus rhythm. Left axis.   Lab Results  Component Value Date   WBC 8.7 02/23/2012   HGB 11.6* 02/23/2012   HCT 35.1* 02/23/2012   PLT 253 02/23/2012   GLUCOSE 96 02/23/2012   ALT 12 03/30/2011   AST 17 03/30/2011   NA 141 02/23/2012   K 3.7 02/23/2012   CL 103 02/23/2012   CREATININE 0.95 02/23/2012   BUN 23 02/23/2012   CO2 29 02/23/2012   INR 5.2* 10/17/2011   Assessment / Plan: 1. Pre op clearance for shoulder surgery - I think given her active symptoms and underlying medical problems she is at a higher risk for surgery. She does not wish to proceed at this time.   2. CAD - with remote CABG in 2004, DES to LM in 2010, LHC in November of 2012 showing significant instent restenosis in the LM, with occluded proximal LCX, patent SVG to OM3 - felt to best be managed medically. Myoview in July 2013 showed normal LV function, anterior attenuation and localized ischemia, inferior, basilar mid section - with continued medical management. She continues to  have active symptoms. I have increased her Norvasc to 5 mg BID. She is to see Dr. Tenny Craw back in one month for follow up.   3. DVT - on Xarelto. She has considerable ecchymosis. She is also on 2 other medicines for arthritis. I have stopped these today. She is to use Tylenol arthritis instead. We will check follow up BMET and CBC today as well.   4. HTN - BP is high. Norvasc is increased.   5. Significant situational stress - I have told her that she will need to ask her children for support in caring for her husband. She is not going to continue to provide this level of care long term.   Patient is agreeable to this plan and will call if any problems develop in the interim.

## 2013-01-04 ENCOUNTER — Telehealth: Payer: Self-pay | Admitting: Internal Medicine

## 2013-01-04 NOTE — Telephone Encounter (Signed)
Pt needs rotator cuff surgery which is not scheduled.  She is requesting a work in appt with Dr Tenny Craw for a surgical clearance so the rotator cuff surgery can be scheduled.  She was offered an appt with a PA or NP but prefers to see Dr Tenny Craw.  She states she saw Norma Fredrickson who felt she should not have surgery yet, but Mrs Wilborn states she cannot wait any longer for the surgery.  She knows Dr Charlott Rakes nurse, Annice Pih is out of the office and is willing to wait until Thursday for a call back.

## 2013-01-04 NOTE — Telephone Encounter (Signed)
New Problem:    Patient called in wanting to be seen by Dr. Tenny Craw sooner so she can be cleared to have surgery and have her torn rotator cuff repaired.  Please call back.

## 2013-01-06 NOTE — Telephone Encounter (Signed)
Called patient. She would like to see Dr.Ross as soon as possible for surgical clearance. Saw Norma Fredrickson recently but has decided that she really does want to go ahead with procedure and needs to discuss cardiac risks with Dr.Ross. I have placed her on the schedule to be seen tomorrow at 815 am.

## 2013-01-07 ENCOUNTER — Ambulatory Visit (INDEPENDENT_AMBULATORY_CARE_PROVIDER_SITE_OTHER): Payer: Medicare Other | Admitting: Internal Medicine

## 2013-01-07 ENCOUNTER — Encounter: Payer: Self-pay | Admitting: Internal Medicine

## 2013-01-07 VITALS — BP 160/80 | HR 80 | Resp 18 | Ht 67.0 in | Wt 180.0 lb

## 2013-01-07 DIAGNOSIS — I679 Cerebrovascular disease, unspecified: Secondary | ICD-10-CM | POA: Diagnosis not present

## 2013-01-07 DIAGNOSIS — I25810 Atherosclerosis of coronary artery bypass graft(s) without angina pectoris: Secondary | ICD-10-CM

## 2013-01-07 DIAGNOSIS — I10 Essential (primary) hypertension: Secondary | ICD-10-CM

## 2013-01-07 NOTE — Progress Notes (Signed)
HPI Patient is a 72 yo with a history of CAD (s/p CABG 2004 and DES to L [place].  Last LHT 11/12 showed instent restenosis to the LM.  LCX was occluded in prox segment.  SVG to OM was patient as well as LAD RCA had signif disease but appeared nondominant The patient was [facility] July 2013 with CP  [facility] showed localized ischemia in the inferior wall (base, mid)  Ranex was added. Did not tolerate metoprolol  Norvasc added.  She also has a hsitory of recurrent DVT (on chronic coumadin), HTN, COPD, HL and carotid stenosis.  She was last seen about a week ago by Darrel Hoover for preop evaluation.  She has severe pain in her L shoulder.  Note she underwent R shoulder replacement last spring  On talking to her her angina has been stable.  It only occurs with exertion.  She takes a NTG about 3x per week.  No PND  Says her symptoms have been stable since last spring. She is under increased stress because husband has severe dementia She comes in today to discuss her shoulder.  She says she cannot bear the pain at times.  Wants to have surgery. Allergies  Allergen Reactions  . Zolpidem Tartrate Anxiety    CONFUSION   . Codeine   . Penicillins Rash    Current Outpatient Prescriptions  Medication Sig Dispense Refill  . ALPRAZolam (XANAX) 0.5 MG tablet Take 0.5 mg by mouth at bedtime.       Marland Kitchen amLODipine (NORVASC) 5 MG tablet Take 1 tablet (5 mg total) by mouth 2 (two) times daily.  60 tablet  6  . aspirin EC 81 MG tablet Take 81 mg by mouth daily.       . citalopram (CELEXA) 20 MG tablet Take 60 mg by mouth daily.      . cyclobenzaprine (FLEXERIL) 10 MG tablet Take 10 mg by mouth 3 (three) times daily.      . ergocalciferol (VITAMIN D2) 50000 UNITS capsule Take 50,000 Units by mouth once a week. (SATURDAY)      . furosemide (LASIX) 80 MG tablet Take 80 mg by mouth 2 (two) times daily.       . isosorbide mononitrate (IMDUR) 60 MG 24 hr tablet Take 60 mg by mouth at  bedtime.      Marland Kitchen levothyroxine (SYNTHROID, LEVOTHROID) 137 MCG tablet Take 137 mcg by mouth daily.       Marland Kitchen lisinopril (PRINIVIL,ZESTRIL) 20 MG tablet Take 20 mg by mouth daily.       . nitroGLYCERIN (NITROSTAT) 0.4 MG SL tablet Place 0.4 mg under the tongue every 5 (five) minutes as needed. For chest pain      . potassium chloride (KLOR-CON) 10 MEQ CR tablet Take 10 mEq by mouth daily.       . pramipexole (MIRAPEX) 0.25 MG tablet Take 0.25 mg by mouth at bedtime.       . ranolazine (RANEXA) 500 MG 12 hr tablet Take 500 mg by mouth 2 (two) times daily.      . rivaroxaban (XARELTO) 10 MG TABS tablet Take 20 mg by mouth daily.      Marland Kitchen tiotropium (SPIRIVA) 18 MCG inhalation capsule Place 18 mcg into inhaler and inhale daily.      . traZODone (DESYREL) 50 MG tablet Take 50 mg by mouth at bedtime.        Past Medical History  Diagnosis Date  . Hypertension   .  PVD (peripheral vascular disease)   . COPD (chronic obstructive pulmonary disease)     Emphysema dxed by Dr. Sherril Croon in [place] based on PFTs per pt in 2006; placed on albuterol  . HLD (hyperlipidemia)   . Osteoarthritis   . DVT of lower extremity (deep venous thrombosis)     recurrent. bilateral (2 episodes)  . Arthralgia     NOS  . Obesity (BMI 30-39.9) 2009    BMI 33  . Hyperthyroidism   . CAD (coronary artery disease)     s/p CABG 2004; s/p DES to LM in 2010;  LHC 10/29/11: EF 50-55%, mild elevated filling pressures, no pulmonary HTN, LM 90% ISR, LAD and CFX occluded, S-RI occluded (old), S-OM3 ok and L-LAD ok, native nondominant RCA 95% -  med rx recommended ; Lexiscan Myoview 7/13 at [facility]: demonstrated "normal LV function, anterior attenuation and localized ischemia, inferior, basilar, mid section"  . Carotid artery occlusion     moderate  . Exertional angina     Treated with Isosorbide, Ranexa, amlodipine; intolerant to metoprolol  . Chronic diastolic heart failure     Echo 9/10: EF 60-55%, grade 1 diastolic dysfunction    . Blood transfusion 2011    WITH BACK SURGERY  . GERD (gastroesophageal reflux disease)   . Depression     TAKES CELEXA  AND WELBUTRIN  . Sleep apnea 2012    USED CPAP THEN  STARTED USING SPIRIVA AND  WAS ABLE TO STOP CPAP.     Past Surgical History  Procedure Date  . Back surgery 1986    lower; another scheduled for later 2009  . Ankle surgery 2004  . Groin mass open biopsy 2004  . Neck surgery 12/08    Due for repeat neck surgery  . Breast enhancement surgery   . Total hip arthroplasty 2007    Right  . Coronary artery bypass graft 2004  . Cardiac catheterization 11/2009    Patent LIMA to LAD and patent SVG to OM1. Occluded SVG to ramus and diagonal. Left main: 90% ostial stenosis, LCX 60-70% proximal stenosis  . Coronary angioplasty with stent placement 11/2009    Drug eluting stent to left main artery: 4.0 X 12 mm Ion   . Eye surgery 2006    BILATERAL CAT EXT.   Marland Kitchen Joint replacement 2012     BILATERAL KNEES  . Joint replacement 2010     RIGHT HIP REPLACEMENT  . Reverse shoulder arthroplasty 02/26/2012    Procedure: REVERSE SHOULDER ARTHROPLASTY;  Surgeon: Senaida Lange, MD;  Location: [facility] OR;  Service: Orthopedics;  Laterality: Right;  RIGHT SHOULDER REVERSED ARTHROPLASTY    Family History  Problem Relation Age of Onset  . Cancer    . Heart disease    . Colitis    . Cancer Mother     Ovarian  . Cancer Father     Lung    History   Social History  . Marital Status: Married    Spouse Name: N/A    Number of Children: 3  . Years of Education: N/A   Occupational History  . retired     International aid/development worker  .     Social History Main Topics  . Smoking status: Former Smoker -- 45 years    Quit date: 12/22/2002  . Smokeless tobacco: Not on file  . Alcohol Use: No  . Drug Use: No  . Sexually Active: No   Other Topics Concern  . Not on file  Social History Narrative   Married 52 years, lives with husband.3 grown children.   \   Review of Systems:  All systems  reviewed.  They are negative to the above problem except as previously stated.  Vital Signs: BP 160/80  Pulse 80  Resp 18  Ht 5\' 7"  (1.702 m)  Wt 180 lb (81.647 kg)  BMI 28.19 kg/m2  Physical Exam Patient is in NAD HEENT:  Normocephalic, atraumatic. EOMI, PERRLA.  Neck: JVP is normal.  .  Lungs: clear to auscultation. No rales no wheezes.  Heart: Regular rate and rhythm. Normal S1, S2. No S3.   No significant murmurs. PMI not displaced.  Abdomen:  Supple, nontender. Normal bowel sounds. No masses. No hepatomegaly.  Extremities:   Good distal pulses throughout. No lower extremity edema.  Musculoskeletal :moving all extremities. L arm bruised. Neuro:   alert and oriented x3.  CN II-XII grossly intact.   Assessment and Plan:  1.  CAD  Patient has significant CAD  She has chronic stable angina.   The patient has chronic stable angina.  She has known CAD as noted above.  She is at some increased risk for perioperative cardiac event and understands this..  I do not think is prohibitive to the proposed orthopedic procedure.  She is debilitated by pain from her shoulder. I would recomm that the patient be followed closely from a hemodyn standpoint in periop period (BP, HR, volume)  Our service will be available as needed.  2.  HTN  Patient is stressed today  Will follow  3.  CV disease  Will need to follow

## 2013-01-10 ENCOUNTER — Ambulatory Visit: Payer: Medicare Other | Admitting: Nurse Practitioner

## 2013-01-13 ENCOUNTER — Telehealth: Payer: Self-pay | Admitting: Internal Medicine

## 2013-01-20 ENCOUNTER — Telehealth: Payer: Self-pay | Admitting: Internal Medicine

## 2013-01-20 NOTE — Telephone Encounter (Signed)
New Problem:    Called in wanting to know the status for a medical clearance to stop patient's rivaroxaban (XARELTO) 10 MG TABS table prior to an upcoming surgery.  Please call or Fax: [phone].

## 2013-01-25 DIAGNOSIS — I2510 Atherosclerotic heart disease of native coronary artery without angina pectoris: Secondary | ICD-10-CM | POA: Diagnosis not present

## 2013-01-25 DIAGNOSIS — E039 Hypothyroidism, unspecified: Secondary | ICD-10-CM | POA: Diagnosis not present

## 2013-01-25 DIAGNOSIS — I80299 Phlebitis and thrombophlebitis of other deep vessels of unspecified lower extremity: Secondary | ICD-10-CM | POA: Diagnosis not present

## 2013-01-25 DIAGNOSIS — G894 Chronic pain syndrome: Secondary | ICD-10-CM | POA: Diagnosis not present

## 2013-01-26 ENCOUNTER — Other Ambulatory Visit: Payer: Self-pay | Admitting: *Deleted

## 2013-01-26 MED ORDER — ISOSORBIDE MONONITRATE ER 60 MG PO TB24: 60.0000 mg | ORAL_TABLET | Freq: Every day | ORAL | Status: DC

## 2013-01-27 ENCOUNTER — Encounter (HOSPITAL_COMMUNITY): Payer: Self-pay | Admitting: Pharmacist

## 2013-02-01 ENCOUNTER — Other Ambulatory Visit: Payer: Self-pay

## 2013-02-01 MED ORDER — METOPROLOL TARTRATE 25 MG PO TABS: 25.0000 mg | ORAL_TABLET | Freq: Two times a day (BID) | ORAL | Status: DC

## 2013-02-02 NOTE — Consult Note (Signed)
Anesthesia Chart Review:  Patient is a 72 year old female scheduled for left shoulder arthroscopy with subacromial decompression and distal clavicle resection by Dr. Rennis Chris on 02/10/13 by Dr. Rennis Chris.  Her PAT is scheduled for 02/03/13, weather permitting.    History includes former smoker, CAD s/p CABG '04 and LM stent '10, chronic stable angina, chronic diastolic CHF, HTN, HLD, PVD, COPD, OSA, GERD, hyperthyroidism, recurrent LE DVT, depression.    Cardiologist is Dr. Tenny Craw, who cleared patient for this procedure following an office visit on 01/07/13.  Her assessment includes, "The patient has chronic stable angina. She has known CAD as noted above. She is at some increased risk for perioperative cardiac event and understands this.. I do not think is prohibitive to the proposed orthopedic procedure. She is debilitated by pain from her shoulder. I would recomm that the patient be followed closely from a hemodyn standpoint in periop period (BP, HR, volume) Our service will be available as needed."  (It appears patient also has an appointment with Dr. Tenny Craw on 02/07/13.)  Records from [facility] are still pending, but notes indicate that she had a Lexiscan Myoview in July 2013 that showed "normal LV function, anterior attenuation and localized ischemia, inferior, basilar, mid section."  Ranexa and Norvasc were added.  She did not tolerate metoprolol.  EKG on 12/28/12 showed NSR, possible LAE, LAD, LVH.  Last cardiac cath on 10/29/11 showed: 1. Mildly elevated filling pressures without significant pulmonary hypertension  2. Normal cardiac output.  3. Low normal LV systolic function with an estimated ejection fraction of 50-55%.  4. Significant three-vessel coronary artery disease with patent LIMA to LAD and SVG to OM 3.  5. Significant left main in-stent restenosis. Recommendations: The patient has significant in-stent restenosis in the left main coronary artery. However, the left circumflex is also now  occluded in the proximal segment. The SVG to OM 3 is patent. There would not be significant advantage of intervening on the left main stent due to patent grafts to LAD as well as OM 3. There is significant disease in the proximal right coronary artery but the vessel seems to be nondominant. Based on that, I recommend continuing medical therapy for coronary artery disease and angina (Dr. Kirke Corin).  Additional review following PAT results and records from [facility].  Shonna Chock, PA-C 02/02/13 1421

## 2013-02-03 ENCOUNTER — Inpatient Hospital Stay (HOSPITAL_COMMUNITY)
Admission: RE | Admit: 2013-02-03 | Discharge: 2013-02-03 | Disposition: A | Discharge status: Home / Self Care | Payer: Medicare Other | Source: Ambulatory Visit

## 2013-02-03 ENCOUNTER — Encounter (HOSPITAL_COMMUNITY): Payer: Self-pay

## 2013-02-03 HISTORY — DX: Anemia, unspecified: D64.9

## 2013-02-03 NOTE — Progress Notes (Signed)
Pt. Followed at [facility]. For her sleep apnea needs. Pt. States approx. 3 months ago, changed fr. Full face mask to a devise for nose. Left msg. At [facility]. [phone] to send records, last OV note, last pft's, last sleep eval. .

## 2013-02-03 NOTE — Progress Notes (Signed)
Call has been made for orders several times yesterday. No response, office is closed today.

## 2013-02-03 NOTE — Pre-Procedure Instructions (Signed)
[name]  02/03/2013   Your procedure is scheduled on:  02/10/2013  Report to [facility] at 10:30 AM.  Call this number if you have problems the morning of surgery: [phone]   Remember:   Do not eat food or drink liquids after midnight. On WEDNESDAY   Take these medicines the morning of surgery with A SIP OF WATER: Norvasc, Celexa, Imdur, Thyroid medicine, use inhaler   Do not wear jewelry, make-up or nail polish.  Do not wear lotions, powders, or perfumes. You may wear deodorant.  Do not shave 48 hours prior to surgery.  Do not bring valuables to the hospital.  Contacts, dentures or bridgework may not be worn into surgery.  Leave suitcase in the car. After surgery it may be brought to your room.  For patients admitted to the hospital, checkout time is 11:00 AM the day of  discharge.   Patients discharged the day of surgery will not be allowed to drive  home.  Name and phone number of your driver: with family    Special Instructions: Shower using CHG 2 nights before surgery and the night before surgery.  If you shower the day of surgery use CHG.  Use special wash - you have one bottle of CHG for all showers.  You should use approximately 1/3 of the bottle for each shower.   Please read over the following fact sheets that you were given: Pain Booklet, Coughing and Deep Breathing, Blood Transfusion Information, MRSA Information and Surgical Site Infection Prevention

## 2013-02-07 ENCOUNTER — Encounter (HOSPITAL_COMMUNITY)
Admission: RE | Admit: 2013-02-07 | Discharge: 2013-02-07 | Disposition: A | Discharge status: Home / Self Care | Payer: Medicare Other | Source: Ambulatory Visit | Attending: Orthopedic Surgery | Admitting: Orthopedic Surgery

## 2013-02-07 ENCOUNTER — Ambulatory Visit (HOSPITAL_COMMUNITY)
Admission: RE | Admit: 2013-02-07 | Discharge: 2013-02-07 | Disposition: A | Discharge status: Home / Self Care | Payer: Medicare Other | Source: Ambulatory Visit | Attending: Anesthesiology | Admitting: Anesthesiology

## 2013-02-07 ENCOUNTER — Encounter (HOSPITAL_COMMUNITY): Payer: Self-pay

## 2013-02-07 ENCOUNTER — Ambulatory Visit: Payer: Medicare Other | Admitting: Internal Medicine

## 2013-02-07 DIAGNOSIS — Z01818 Encounter for other preprocedural examination: Secondary | ICD-10-CM | POA: Insufficient documentation

## 2013-02-07 DIAGNOSIS — Z951 Presence of aortocoronary bypass graft: Secondary | ICD-10-CM | POA: Insufficient documentation

## 2013-02-07 DIAGNOSIS — Z01812 Encounter for preprocedural laboratory examination: Secondary | ICD-10-CM | POA: Insufficient documentation

## 2013-02-07 DIAGNOSIS — I10 Essential (primary) hypertension: Secondary | ICD-10-CM | POA: Diagnosis not present

## 2013-02-07 LAB — CBC
HCT: 33.9 % — ABNORMAL LOW (ref 36.0–46.0)
Hemoglobin: 10.9 g/dL — ABNORMAL LOW (ref 12.0–15.0)
MCH: 29.5 pg (ref 26.0–34.0)
MCHC: 32.2 g/dL (ref 30.0–36.0)
MCV: 91.9 fL (ref 78.0–100.0)
Platelets: 274 10*3/uL (ref 150–400)
RBC: 3.69 MIL/uL — ABNORMAL LOW (ref 3.87–5.11)
RDW: 13.5 % (ref 11.5–15.5)
WBC: 10.2 10*3/uL (ref 4.0–10.5)

## 2013-02-07 LAB — BASIC METABOLIC PANEL
BUN: 17 mg/dL (ref 6–23)
CO2: 35 mEq/L — ABNORMAL HIGH (ref 19–32)
Calcium: 9.7 mg/dL (ref 8.4–10.5)
Chloride: 98 mEq/L (ref 96–112)
Creatinine, Ser: 1.1 mg/dL (ref 0.50–1.10)
GFR calc Af Amer: 57 mL/min — ABNORMAL LOW (ref >90)
GFR calc non Af Amer: 49 mL/min — ABNORMAL LOW (ref >90)
Glucose, Bld: 88 mg/dL (ref 70–99)
Potassium: 3.5 mEq/L (ref 3.5–5.1)
Sodium: 142 mEq/L (ref 135–145)

## 2013-02-07 LAB — SURGICAL PCR SCREEN
MRSA, PCR: NEGATIVE
Staphylococcus aureus: NEGATIVE

## 2013-02-07 NOTE — Progress Notes (Signed)
Requested orders from [facility] at Dr.Supple's office

## 2013-02-07 NOTE — Pre-Procedure Instructions (Signed)
[name]  02/07/2013   Your procedure is scheduled on:  Thurs, Feb 20 @ 12:35 PM  Report to [facility] at 10:30 AM.  Call this number if you have problems the morning of surgery: [phone]   Remember:   Do not eat food or drink liquids after midnight.   Take these medicines the morning of surgery with A SIP OF WATER: Alprazolam(Xanax),Amlodipine(Norvasc),Citalopram(Celexa),Imdur(Isosorbide),Synthroid(Levothyroxine),Metoprolol(Lopressor),and Spiriva(Tiotropium)             Stop taking your Xarelto--ok per Cardiology 3days prior to surgery   Do not wear jewelry, make-up or nail polish.  Do not wear lotions, powders, or perfumes. You may wear deodorant.  Do not shave 48 hours prior to surgery.   Do not bring valuables to the hospital.  Contacts, dentures or bridgework may not be worn into surgery.  Leave suitcase in the car. After surgery it may be brought to your room.  For patients admitted to the hospital, checkout time is 11:00 AM the day of  discharge.   Patients discharged the day of surgery will not be allowed to drive  home.    Special Instructions: Shower using CHG 2 nights before surgery and the night before surgery.  If you shower the day of surgery use CHG.  Use special wash - you have one bottle of CHG for all showers.  You should use approximately 1/3 of the bottle for each shower.   Please read over the following fact sheets that you were given: Pain Booklet, Coughing and Deep Breathing, Blood Transfusion Information, Total Joint Packet, MRSA Information and Surgical Site Infection Prevention

## 2013-02-07 NOTE — Consult Note (Signed)
Anesthesia Follow-up:  See my note from 02/02/13.  PAT rescheduled PAT visit for today due to inclement weather.  I did not see patient at her PAT visit.  Nuclear stress test from [facility] on 06/30/12 showed abnormal exam, positive for mid to basilar inferior wall inducible ischemia with pharmacologic stress.  EF 64%.    CXR on 02/07/13 showed no acute cardiopulmonary abnormality.  Her last OSA records from [facility] have been requested, but are still pending.  Preoperative labs noted.  As mentioned in my previous note, Cardiologist Dr. Tenny Craw is aware of stress test results and planned surgery.  She thought patient was at some increased risk, but not prohibitive.  Patient will be evaluated by her assigned anesthesiologist on the day of surgery.  If no acute changes then would anticipate she could proceed as planned.  Shonna Chock, PA-C 02/07/13 1553

## 2013-02-09 MED ORDER — DEXTROSE 5 % IV SOLN
3.0000 g | INTRAVENOUS | Status: AC
  Administered 2013-02-10: 3 g via INTRAVENOUS
  Filled 2013-02-09: qty 3000

## 2013-02-09 MED ORDER — LACTATED RINGERS IV SOLN
INTRAVENOUS | Status: DC
  Administered 2013-02-10: 10:00:00 via INTRAVENOUS

## 2013-02-09 NOTE — Progress Notes (Signed)
1630   MADE CONTACT WITH PATIENT AND MADE HER AWARE OF SURGERY TIME CHANGE...INSTRUCTED HER TO BE HERE AT 8:00 AM..Marland KitchenDA

## 2013-02-10 ENCOUNTER — Encounter (HOSPITAL_COMMUNITY)
Admission: RE | Disposition: A | Discharge status: Home / Self Care | Payer: Self-pay | Source: Ambulatory Visit | Attending: Orthopedic Surgery

## 2013-02-10 ENCOUNTER — Encounter (HOSPITAL_COMMUNITY): Payer: Self-pay | Admitting: Vascular Surgery

## 2013-02-10 ENCOUNTER — Encounter (HOSPITAL_COMMUNITY): Payer: Self-pay

## 2013-02-10 ENCOUNTER — Observation Stay (HOSPITAL_COMMUNITY)
Admission: RE | Admit: 2013-02-10 | Discharge: 2013-02-11 | Disposition: A | Discharge status: Home / Self Care | Payer: Medicare Other | Source: Ambulatory Visit | Attending: Orthopedic Surgery | Admitting: Orthopedic Surgery

## 2013-02-10 ENCOUNTER — Inpatient Hospital Stay (HOSPITAL_COMMUNITY): Payer: Medicare Other | Admitting: Certified Registered"

## 2013-02-10 DIAGNOSIS — M7542 Impingement syndrome of left shoulder: Secondary | ICD-10-CM | POA: Diagnosis present

## 2013-02-10 DIAGNOSIS — J438 Other emphysema: Secondary | ICD-10-CM | POA: Insufficient documentation

## 2013-02-10 DIAGNOSIS — G8918 Other acute postprocedural pain: Secondary | ICD-10-CM | POA: Diagnosis not present

## 2013-02-10 DIAGNOSIS — M67919 Unspecified disorder of synovium and tendon, unspecified shoulder: Secondary | ICD-10-CM | POA: Diagnosis not present

## 2013-02-10 DIAGNOSIS — M25819 Other specified joint disorders, unspecified shoulder: Principal | ICD-10-CM | POA: Insufficient documentation

## 2013-02-10 DIAGNOSIS — M7500 Adhesive capsulitis of unspecified shoulder: Secondary | ICD-10-CM | POA: Diagnosis not present

## 2013-02-10 DIAGNOSIS — E7200 Disorders of amino-acid transport, unspecified: Secondary | ICD-10-CM | POA: Diagnosis not present

## 2013-02-10 DIAGNOSIS — Z86718 Personal history of other venous thrombosis and embolism: Secondary | ICD-10-CM | POA: Insufficient documentation

## 2013-02-10 DIAGNOSIS — F329 Major depressive disorder, single episode, unspecified: Secondary | ICD-10-CM | POA: Insufficient documentation

## 2013-02-10 DIAGNOSIS — K219 Gastro-esophageal reflux disease without esophagitis: Secondary | ICD-10-CM | POA: Diagnosis not present

## 2013-02-10 DIAGNOSIS — I10 Essential (primary) hypertension: Secondary | ICD-10-CM | POA: Insufficient documentation

## 2013-02-10 DIAGNOSIS — I2510 Atherosclerotic heart disease of native coronary artery without angina pectoris: Secondary | ICD-10-CM | POA: Insufficient documentation

## 2013-02-10 DIAGNOSIS — M249 Joint derangement, unspecified: Secondary | ICD-10-CM | POA: Insufficient documentation

## 2013-02-10 DIAGNOSIS — E785 Hyperlipidemia, unspecified: Secondary | ICD-10-CM | POA: Insufficient documentation

## 2013-02-10 DIAGNOSIS — M719 Bursopathy, unspecified: Secondary | ICD-10-CM | POA: Insufficient documentation

## 2013-02-10 DIAGNOSIS — F3289 Other specified depressive episodes: Secondary | ICD-10-CM | POA: Insufficient documentation

## 2013-02-10 DIAGNOSIS — Z6833 Body mass index (BMI) 33.0-33.9, adult: Secondary | ICD-10-CM | POA: Diagnosis not present

## 2013-02-10 DIAGNOSIS — M7530 Calcific tendinitis of unspecified shoulder: Secondary | ICD-10-CM | POA: Diagnosis not present

## 2013-02-10 HISTORY — PX: SHOULDER ARTHROSCOPY WITH SUBACROMIAL DECOMPRESSION: SHX5684

## 2013-02-10 LAB — APTT: aPTT: 28 seconds (ref 24–37)

## 2013-02-10 LAB — PROTIME-INR
INR: 0.97 (ref 0.00–1.49)
Prothrombin Time: 12.8 seconds (ref 11.6–15.2)

## 2013-02-10 SURGERY — SHOULDER ARTHROSCOPY WITH SUBACROMIAL DECOMPRESSION
Anesthesia: Choice | Site: Shoulder | Laterality: Left | Wound class: Clean

## 2013-02-10 MED ORDER — POTASSIUM CHLORIDE CRYS ER 10 MEQ PO TBCR
10.0000 meq | EXTENDED_RELEASE_TABLET | Freq: Two times a day (BID) | ORAL | Status: DC
  Administered 2013-02-10 – 2013-02-11 (×3): 10 meq via ORAL
  Filled 2013-02-10 (×4): qty 1

## 2013-02-10 MED ORDER — RIVAROXABAN 20 MG PO TABS
20.0000 mg | ORAL_TABLET | Freq: Every day | ORAL | Status: DC
  Administered 2013-02-10: 20 mg via ORAL
  Filled 2013-02-10 (×2): qty 1

## 2013-02-10 MED ORDER — GLYCOPYRROLATE 0.2 MG/ML IJ SOLN
INTRAMUSCULAR | Status: DC | PRN
  Administered 2013-02-10 (×2): 0.4 mg via INTRAVENOUS

## 2013-02-10 MED ORDER — ONDANSETRON HCL 4 MG PO TABS: 4.0000 mg | ORAL_TABLET | ORAL | Status: DC | PRN

## 2013-02-10 MED ORDER — FENTANYL CITRATE 0.05 MG/ML IJ SOLN: INTRAMUSCULAR | Status: DC | PRN

## 2013-02-10 MED ORDER — METOPROLOL TARTRATE 12.5 MG HALF TABLET
25.0000 mg | ORAL_TABLET | Freq: Once | ORAL | Status: AC
  Administered 2013-02-10: 12.5 mg via ORAL

## 2013-02-10 MED ORDER — PROPOFOL 10 MG/ML IV BOLUS
INTRAVENOUS | Status: DC | PRN
  Administered 2013-02-10: 120 mg via INTRAVENOUS

## 2013-02-10 MED ORDER — OXYCODONE-ACETAMINOPHEN 5-325 MG PO TABS: 1.0000 | ORAL_TABLET | ORAL | Status: DC | PRN

## 2013-02-10 MED ORDER — PHENYLEPHRINE HCL 10 MG/ML IJ SOLN
INTRAMUSCULAR | Status: DC | PRN
  Administered 2013-02-10 (×6): 80 ug via INTRAVENOUS

## 2013-02-10 MED ORDER — EPHEDRINE SULFATE 50 MG/ML IJ SOLN
INTRAMUSCULAR | Status: DC | PRN
  Administered 2013-02-10: 5 mg via INTRAVENOUS
  Administered 2013-02-10: 10 mg via INTRAVENOUS
  Administered 2013-02-10 (×3): 5 mg via INTRAVENOUS
  Administered 2013-02-10: 10 mg via INTRAVENOUS

## 2013-02-10 MED ORDER — NITROGLYCERIN 0.4 MG SL SUBL: 0.4000 mg | SUBLINGUAL_TABLET | SUBLINGUAL | Status: DC | PRN

## 2013-02-10 MED ORDER — ISOSORBIDE MONONITRATE ER 60 MG PO TB24
60.0000 mg | ORAL_TABLET | Freq: Every day | ORAL | Status: DC
  Filled 2013-02-10 (×2): qty 1

## 2013-02-10 MED ORDER — PRAMIPEXOLE DIHYDROCHLORIDE 0.25 MG PO TABS
0.2500 mg | ORAL_TABLET | Freq: Every day | ORAL | Status: DC
  Administered 2013-02-10: 0.25 mg via ORAL
  Filled 2013-02-10 (×2): qty 1

## 2013-02-10 MED ORDER — LACTATED RINGERS IV SOLN
INTRAVENOUS | Status: DC | PRN
  Administered 2013-02-10 (×2): via INTRAVENOUS

## 2013-02-10 MED ORDER — ROFLUMILAST 500 MCG PO TABS
500.0000 ug | ORAL_TABLET | Freq: Every day | ORAL | Status: DC
  Administered 2013-02-10: 500 ug via ORAL
  Filled 2013-02-10 (×2): qty 1

## 2013-02-10 MED ORDER — TIOTROPIUM BROMIDE MONOHYDRATE 18 MCG IN CAPS
18.0000 ug | ORAL_CAPSULE | Freq: Every day | RESPIRATORY_TRACT | Status: DC
  Administered 2013-02-11: 18 ug via RESPIRATORY_TRACT
  Filled 2013-02-10: qty 5

## 2013-02-10 MED ORDER — FUROSEMIDE 80 MG PO TABS
80.0000 mg | ORAL_TABLET | Freq: Two times a day (BID) | ORAL | Status: DC
  Administered 2013-02-10 – 2013-02-11 (×3): 80 mg via ORAL
  Filled 2013-02-10 (×4): qty 1

## 2013-02-10 MED ORDER — TRAZODONE HCL 50 MG PO TABS
50.0000 mg | ORAL_TABLET | Freq: Every day | ORAL | Status: DC
  Administered 2013-02-10: 50 mg via ORAL
  Filled 2013-02-10 (×2): qty 1

## 2013-02-10 MED ORDER — ALUM & MAG HYDROXIDE-SIMETH 200-200-20 MG/5ML PO SUSP: 15.0000 mL | Freq: Four times a day (QID) | ORAL | Status: DC | PRN

## 2013-02-10 MED ORDER — ROCURONIUM BROMIDE 100 MG/10ML IV SOLN
INTRAVENOUS | Status: DC | PRN
  Administered 2013-02-10: 30 mg via INTRAVENOUS

## 2013-02-10 MED ORDER — LACTATED RINGERS IV SOLN
INTRAVENOUS | Status: DC
  Administered 2013-02-11: 01:00:00 via INTRAVENOUS

## 2013-02-10 MED ORDER — CYCLOBENZAPRINE HCL 10 MG PO TABS
10.0000 mg | ORAL_TABLET | Freq: Three times a day (TID) | ORAL | Status: DC | PRN
Start: 2013-02-10 — End: 2014-04-26

## 2013-02-10 MED ORDER — METOPROLOL TARTRATE 12.5 MG HALF TABLET
ORAL_TABLET | ORAL | Status: AC
  Administered 2013-02-10: 12.5 mg via ORAL
  Filled 2013-02-10: qty 2

## 2013-02-10 MED ORDER — CHLORHEXIDINE GLUCONATE 4 % EX LIQD: 60.0000 mL | Freq: Once | CUTANEOUS | Status: DC

## 2013-02-10 MED ORDER — ONDANSETRON HCL 4 MG/2ML IJ SOLN
INTRAMUSCULAR | Status: DC | PRN
  Administered 2013-02-10: 4 mg via INTRAVENOUS

## 2013-02-10 MED ORDER — METHOCARBAMOL 100 MG/ML IJ SOLN: 500.0000 mg | Freq: Three times a day (TID) | INTRAMUSCULAR | Status: DC | PRN

## 2013-02-10 MED ORDER — ONDANSETRON HCL 4 MG/2ML IJ SOLN: 4.0000 mg | Freq: Once | INTRAMUSCULAR | Status: DC | PRN

## 2013-02-10 MED ORDER — LIDOCAINE HCL (CARDIAC) 20 MG/ML IV SOLN
INTRAVENOUS | Status: DC | PRN
  Administered 2013-02-10: 100 mg via INTRAVENOUS

## 2013-02-10 MED ORDER — HYDROMORPHONE HCL PF 1 MG/ML IJ SOLN: 0.2500 mg | INTRAMUSCULAR | Status: DC | PRN

## 2013-02-10 MED ORDER — METOPROLOL TARTRATE 25 MG PO TABS: 25.0000 mg | ORAL_TABLET | Freq: Once | ORAL | Status: DC

## 2013-02-10 MED ORDER — NEOSTIGMINE METHYLSULFATE 1 MG/ML IJ SOLN
INTRAMUSCULAR | Status: DC | PRN
  Administered 2013-02-10: 3 mg via INTRAVENOUS

## 2013-02-10 MED ORDER — AMLODIPINE BESYLATE 5 MG PO TABS
5.0000 mg | ORAL_TABLET | Freq: Two times a day (BID) | ORAL | Status: DC
  Administered 2013-02-10 – 2013-02-11 (×2): 5 mg via ORAL
  Filled 2013-02-10 (×4): qty 1

## 2013-02-10 MED ORDER — METOPROLOL TARTRATE 25 MG PO TABS
25.0000 mg | ORAL_TABLET | Freq: Two times a day (BID) | ORAL | Status: DC
  Administered 2013-02-10 – 2013-02-11 (×2): 25 mg via ORAL
  Filled 2013-02-10 (×4): qty 1

## 2013-02-10 MED ORDER — PHENOL 1.4 % MT LIQD: 1.0000 | OROMUCOSAL | Status: DC | PRN

## 2013-02-10 MED ORDER — FENTANYL CITRATE 0.05 MG/ML IJ SOLN
INTRAMUSCULAR | Status: AC
  Administered 2013-02-10: 50 ug via INTRAVENOUS
  Filled 2013-02-10: qty 2

## 2013-02-10 MED ORDER — ALPRAZOLAM 0.5 MG PO TABS
0.5000 mg | ORAL_TABLET | Freq: Every day | ORAL | Status: DC
  Administered 2013-02-10: 0.5 mg via ORAL
  Filled 2013-02-10: qty 1

## 2013-02-10 MED ORDER — LEVOTHYROXINE SODIUM 137 MCG PO TABS
137.0000 ug | ORAL_TABLET | Freq: Every day | ORAL | Status: DC
  Administered 2013-02-11: 137 ug via ORAL
  Filled 2013-02-10 (×2): qty 1

## 2013-02-10 MED ORDER — ALUM & MAG HYDROXIDE-SIMETH 500-450-40 MG/5ML PO SUSP: 15.0000 mL | Freq: Four times a day (QID) | ORAL | Status: DC | PRN

## 2013-02-10 MED ORDER — FENTANYL CITRATE 0.05 MG/ML IJ SOLN
50.0000 ug | INTRAMUSCULAR | Status: DC | PRN
  Administered 2013-02-10: 50 ug via INTRAVENOUS

## 2013-02-10 MED ORDER — OXYCODONE-ACETAMINOPHEN 5-325 MG PO TABS
1.0000 | ORAL_TABLET | ORAL | Status: DC | PRN
  Administered 2013-02-10 (×2): 1 via ORAL
  Administered 2013-02-11 (×2): 2 via ORAL
  Filled 2013-02-10 (×2): qty 1
  Filled 2013-02-10 (×2): qty 2

## 2013-02-10 MED ORDER — PILOCARPINE HCL 4 % OP SOLN
1.0000 [drp] | Freq: Two times a day (BID) | OPHTHALMIC | Status: DC
  Administered 2013-02-10 – 2013-02-11 (×2): 1 [drp] via OPHTHALMIC
  Filled 2013-02-10: qty 15

## 2013-02-10 MED ORDER — MORPHINE SULFATE 2 MG/ML IJ SOLN
2.0000 mg | INTRAMUSCULAR | Status: DC | PRN
  Administered 2013-02-11 (×2): 2 mg via INTRAVENOUS
  Filled 2013-02-10 (×2): qty 1

## 2013-02-10 MED ORDER — LISINOPRIL 20 MG PO TABS
20.0000 mg | ORAL_TABLET | Freq: Every day | ORAL | Status: DC
Start: 2013-02-10 — End: 2013-02-11
  Administered 2013-02-10 – 2013-02-11 (×2): 20 mg via ORAL
  Filled 2013-02-10 (×2): qty 1

## 2013-02-10 MED ORDER — RANOLAZINE ER 500 MG PO TB12
500.0000 mg | ORAL_TABLET | Freq: Two times a day (BID) | ORAL | Status: DC
  Administered 2013-02-10 – 2013-02-11 (×3): 500 mg via ORAL
  Filled 2013-02-10 (×4): qty 1

## 2013-02-10 MED ORDER — SODIUM CHLORIDE 0.9 % IR SOLN
Status: DC | PRN
  Administered 2013-02-10: 6000 mL

## 2013-02-10 MED ORDER — PHENYLEPHRINE HCL 10 MG/ML IJ SOLN
10.0000 mg | INTRAVENOUS | Status: DC | PRN
  Administered 2013-02-10: 25 ug/min via INTRAVENOUS

## 2013-02-10 MED ORDER — CITALOPRAM HYDROBROMIDE 20 MG PO TABS
20.0000 mg | ORAL_TABLET | Freq: Three times a day (TID) | ORAL | Status: DC
  Administered 2013-02-10 – 2013-02-11 (×3): 20 mg via ORAL
  Filled 2013-02-10 (×5): qty 1

## 2013-02-10 MED ORDER — ONDANSETRON HCL 4 MG/2ML IJ SOLN: 4.0000 mg | INTRAMUSCULAR | Status: DC | PRN

## 2013-02-10 MED ORDER — METOPROLOL TARTRATE 25 MG/10 ML ORAL SUSPENSION: 25.0000 mg | Freq: Once | ORAL | Status: DC

## 2013-02-10 SURGICAL SUPPLY — 58 items
BLADE CUTTER GATOR 3.5 (BLADE) ×2 IMPLANT
BLADE GREAT WHITE 4.2 (BLADE) ×2 IMPLANT
BLADE SURG 11 STRL SS (BLADE) ×2 IMPLANT
BOOTCOVER CLEANROOM LRG (PROTECTIVE WEAR) ×4 IMPLANT
BUR 3.5 LG SPHERICAL (BURR) IMPLANT
BUR OVAL 4.0 (BURR) ×2 IMPLANT
BURR 3.5 LG SPHERICAL (BURR)
CANISTER SUCT LVC 12 LTR MEDI- (MISCELLANEOUS) ×2 IMPLANT
CANNULA ACUFLEX KIT 5X76 (CANNULA) ×2 IMPLANT
CANNULA DRILOCK 5.0X75 (CANNULA) ×3 IMPLANT
CLOTH BEACON ORANGE TIMEOUT ST (SAFETY) ×2 IMPLANT
CLSR STERI-STRIP ANTIMIC 1/2X4 (GAUZE/BANDAGES/DRESSINGS) ×1 IMPLANT
CONNECTOR 5 IN 1 STRAIGHT STRL (MISCELLANEOUS) ×2 IMPLANT
DRAPE INCISE 23X17 IOBAN STRL (DRAPES) ×1
DRAPE INCISE 23X17 STRL (DRAPES) ×1 IMPLANT
DRAPE INCISE IOBAN 23X17 STRL (DRAPES) ×1 IMPLANT
DRAPE INCISE IOBAN 66X45 STRL (DRAPES) ×2 IMPLANT
DRAPE STERI 35X30 U-POUCH (DRAPES) ×2 IMPLANT
DRAPE SURG 17X11 SM STRL (DRAPES) ×2 IMPLANT
DRAPE U-SHAPE 47X51 STRL (DRAPES) ×2 IMPLANT
DRSG PAD ABDOMINAL 8X10 ST (GAUZE/BANDAGES/DRESSINGS) ×4 IMPLANT
DURAPREP 26ML APPLICATOR (WOUND CARE) ×4 IMPLANT
ELECT REM PT RETURN 9FT ADLT (ELECTROSURGICAL) ×2
ELECTRODE REM PT RTRN 9FT ADLT (ELECTROSURGICAL) ×1 IMPLANT
GLOVE BIO SURGEON STRL SZ7.5 (GLOVE) ×2 IMPLANT
GLOVE BIO SURGEON STRL SZ8 (GLOVE) ×2 IMPLANT
GLOVE EUDERMIC 7 POWDERFREE (GLOVE) ×2 IMPLANT
GLOVE SS BIOGEL STRL SZ 7.5 (GLOVE) ×1 IMPLANT
GLOVE SUPERSENSE BIOGEL SZ 7.5 (GLOVE) ×1
GOWN STRL NON-REIN LRG LVL3 (GOWN DISPOSABLE) ×2 IMPLANT
GOWN STRL REIN XL XLG (GOWN DISPOSABLE) ×8 IMPLANT
KIT BASIN OR (CUSTOM PROCEDURE TRAY) ×2 IMPLANT
KIT ROOM TURNOVER OR (KITS) ×2 IMPLANT
KIT SHOULDER TRACTION (DRAPES) ×2 IMPLANT
MANIFOLD NEPTUNE II (INSTRUMENTS) ×2 IMPLANT
NDL SPNL 18GX3.5 QUINCKE PK (NEEDLE) ×1 IMPLANT
NDL SUT 6 .5 CRC .975X.05 MAYO (NEEDLE) IMPLANT
NEEDLE MAYO TAPER (NEEDLE)
NEEDLE SPNL 18GX3.5 QUINCKE PK (NEEDLE) ×2 IMPLANT
NS IRRIG 1000ML POUR BTL (IV SOLUTION) ×2 IMPLANT
PACK SHOULDER (CUSTOM PROCEDURE TRAY) ×2 IMPLANT
PAD ARMBOARD 7.5X6 YLW CONV (MISCELLANEOUS) ×4 IMPLANT
SET ARTHROSCOPY TUBING (MISCELLANEOUS) ×2
SET ARTHROSCOPY TUBING LN (MISCELLANEOUS) ×1 IMPLANT
SLING ARM FOAM STRAP LRG (SOFTGOODS) IMPLANT
SLING ARM FOAM STRAP MED (SOFTGOODS) ×2 IMPLANT
SPONGE GAUZE 4X4 12PLY (GAUZE/BANDAGES/DRESSINGS) ×2 IMPLANT
SPONGE LAP 4X18 X RAY DECT (DISPOSABLE) ×2 IMPLANT
STRIP CLOSURE SKIN 1/2X4 (GAUZE/BANDAGES/DRESSINGS) ×2 IMPLANT
SUT MNCRL AB 3-0 PS2 18 (SUTURE) ×2 IMPLANT
SUT PDS AB 0 CT 36 (SUTURE) IMPLANT
SUT RETRIEVER GRASP 30 DEG (SUTURE) IMPLANT
SYR 20CC LL (SYRINGE) ×2 IMPLANT
TAPE PAPER 3X10 WHT MICROPORE (GAUZE/BANDAGES/DRESSINGS) ×2 IMPLANT
TOWEL OR 17X24 6PK STRL BLUE (TOWEL DISPOSABLE) ×2 IMPLANT
TOWEL OR 17X26 10 PK STRL BLUE (TOWEL DISPOSABLE) ×2 IMPLANT
WAND SUCTION MAX 4MM 90S (SURGICAL WAND) ×2 IMPLANT
WATER STERILE IRR 1000ML POUR (IV SOLUTION) ×2 IMPLANT

## 2013-02-10 NOTE — Preoperative (Addendum)
Beta Blockers   Reason not to administer Beta Blockers: B blocker taken this am in short stay pre-op

## 2013-02-10 NOTE — Progress Notes (Addendum)
Pt did not take am meds as instructed due to her spouse falling this am and having to go to hospital in [place].

## 2013-02-10 NOTE — H&P (Signed)
[name]    Chief Complaint: LEFT SHOULDER CHRONIC IMPINGEMENT HPI: The patient is a 72 y.o. female with chronic left shoulder pain, impingement, and calcific tendonitis  Past Medical History  Diagnosis Date  . Hypertension   . PVD (peripheral vascular disease)   . COPD (chronic obstructive pulmonary disease)     Emphysema dxed by Dr. Sherril Croon in [place] based on PFTs per pt in 2006; placed on albuterol  . HLD (hyperlipidemia)   . Osteoarthritis   . DVT of lower extremity (deep venous thrombosis)     recurrent. bilateral (2 episodes)  . Arthralgia     NOS  . Obesity (BMI 30-39.9) 2009    BMI 33  . Hyperthyroidism   . CAD (coronary artery disease)     s/p CABG 2004; s/p DES to LM in 2010;  LHC 10/29/11: EF 50-55%, mild elevated filling pressures, no pulmonary HTN, LM 90% ISR, LAD and CFX occluded, S-RI occluded (old), S-OM3 ok and L-LAD ok, native nondominant RCA 95% -  med rx recommended ; Lexiscan Myoview 7/13 at [facility]: demonstrated "normal LV function, anterior attenuation and localized ischemia, inferior, basilar, mid section"  . Carotid artery occlusion     moderate  . Exertional angina     Treated with Isosorbide, Ranexa, amlodipine; intolerant to metoprolol  . Chronic diastolic heart failure     Echo 9/10: EF 60-55%, grade 1 diastolic dysfunction  . Blood transfusion 2011    WITH BACK SURGERY  . GERD (gastroesophageal reflux disease)   . Depression     TAKES CELEXA  AND  (OFF- WELBUTRIN)  . Sleep apnea 2012    USED CPAP THEN  STARTED USING SPIRIVA , Nov. 2013- last evaluation , changed from mask to aparatus that is just for her nose..   . Anemia     bld. transfusion post lumbar surgery- 2012    Past Surgical History  Procedure Laterality Date  . Ankle surgery  2004  . Groin mass open biopsy  2004  . Neck surgery  12/08    Due for repeat neck surgery  . Breast enhancement surgery    . Total hip arthroplasty  2007    Right  . Coronary artery bypass graft   2004  . Cardiac catheterization  11/2009    Patent LIMA to LAD and patent SVG to OM1. Occluded SVG to ramus and diagonal. Left main: 90% ostial stenosis, LCX 60-70% proximal stenosis  . Coronary angioplasty with stent placement  11/2009    Drug eluting stent to left main artery: 4.0 X 12 mm Ion   . Joint replacement  2012     BILATERAL KNEES  . Joint replacement  2010     RIGHT HIP REPLACEMENT  . Reverse shoulder arthroplasty  02/26/2012    Procedure: REVERSE SHOULDER ARTHROPLASTY;  Surgeon: Senaida Lange, MD;  Location: [facility] OR;  Service: Orthopedics;  Laterality: Right;  RIGHT SHOULDER REVERSED ARTHROPLASTY  . Eye surgery  2006    BILATERAL CAT EXT.   . Back surgery  1986    lower; another scheduled, opt. reports 4 back- lumbar, 3 cerv. fusions  for later 2009    Family History  Problem Relation Age of Onset  . Cancer    . Heart disease    . Colitis    . Cancer Mother     Ovarian  . Cancer Father     Lung    Social History:  reports that she quit smoking about 10 years  ago. She does not have any smokeless tobacco history on file. She reports that she does not drink alcohol or use illicit drugs.  Allergies:  Allergies  Allergen Reactions  . Zolpidem Tartrate Anxiety    CONFUSION   . Codeine Other (See Comments)    hallucinate  . Penicillins Rash    Medications Prior to Admission  Medication Sig Dispense Refill  . ALPRAZolam (XANAX) 0.5 MG tablet Take 0.5 mg by mouth at bedtime.       Marland Kitchen aluminum & magnesium hydroxide-simethicone (MYLANTA) 500-450-40 MG/5ML suspension Take by mouth every 6 (six) hours as needed for indigestion.      Marland Kitchen amLODipine (NORVASC) 5 MG tablet Take 1 tablet (5 mg total) by mouth 2 (two) times daily.  60 tablet  6  . citalopram (CELEXA) 20 MG tablet Take 20 mg by mouth 3 (three) times daily. Takes one tablet TID      . cyclobenzaprine (FLEXERIL) 10 MG tablet Take 10 mg by mouth 3 (three) times daily as needed.       . ergocalciferol (VITAMIN D2) 50000  UNITS capsule Take 50,000 Units by mouth once a week. (SATURDAY)      . furosemide (LASIX) 80 MG tablet Take 80 mg by mouth 2 (two) times daily.       . isosorbide mononitrate (IMDUR) 60 MG 24 hr tablet Take 1 tablet (60 mg total) by mouth at bedtime.  90 tablet  3  . levothyroxine (SYNTHROID, LEVOTHROID) 137 MCG tablet Take 137 mcg by mouth daily before breakfast.       . lisinopril (PRINIVIL,ZESTRIL) 20 MG tablet Take 20 mg by mouth daily. Pt. Reports that she takes her BP & if its elevated she takes 40 mg. Instead of 20mg .      . metoprolol tartrate (LOPRESSOR) 25 MG tablet Take 1 tablet (25 mg total) by mouth 2 (two) times daily.  60 tablet  5  . nitroGLYCERIN (NITROSTAT) 0.4 MG SL tablet Place 0.4 mg under the tongue every 5 (five) minutes as needed. For chest pain      . pilocarpine (PILOCAR) 4 % ophthalmic solution Place 1 drop into both eyes 2 (two) times daily.      . potassium chloride (KLOR-CON) 10 MEQ CR tablet Take 10 mEq by mouth daily.       . pramipexole (MIRAPEX) 0.25 MG tablet Take 0.25 mg by mouth at bedtime.       . ranolazine (RANEXA) 500 MG 12 hr tablet Take 500 mg by mouth 2 (two) times daily.      . Rivaroxaban (XARELTO) 20 MG TABS Take 20 mg by mouth daily with supper. Pt. States she was told to hold Xarelto- for 5 days preop, yet paper work states to hold for 3 days      . roflumilast (DALIRESP) 500 MCG TABS tablet Take 500 mcg by mouth at bedtime.       Marland Kitchen tiotropium (SPIRIVA) 18 MCG inhalation capsule Place 18 mcg into inhaler and inhale daily.      . traZODone (DESYREL) 50 MG tablet Take 50 mg by mouth at bedtime.         Physical Exam: left shoulder with severly restricted and painful arc of motion as noted at recent office visit  Vitals  Temp:  [97.7 F (36.5 C)] 97.7 F (36.5 C) (02/20 0812) Pulse Rate:  [77-85] 77 (02/20 0848) Resp:  [18] 18 (02/20 0812) BP: (106)/(61) 106/61 mmHg (02/20 0848) SpO2:  [97 %] 97 % (  02/20  0812)  Assessment/Plan  Impression: LEFT SHOULDER CHRONIC IMPINGEMENT  Plan of Action: Procedure(s): SHOULDER ARTHROSCOPY WITH SUBACROMIAL DECOMPRESSION DISTAL CLAVICLE RESECTION  Thor Nannini M 02/10/2013, 9:43 AM

## 2013-02-10 NOTE — Op Note (Signed)
02/10/2013  11:57 AM  PATIENT:   Georgina Quint  72 y.o. female  PRE-OPERATIVE DIAGNOSIS:  LEFT SHOULDER CHRONIC IMPINGEMENT  POST-OPERATIVE DIAGNOSIS:  Same with chronic rotator cuff tear, synovitis, labral tear, and calcific tendonitis  PROCEDURE:  LSA, debridement of labrum and rotator cuff, SAD, synovectomy, LOA  SURGEON:  Emilliano Dilworth, Vania Rea. M.D.  ASSISTANTS: Shuford pac   ANESTHESIA:   GET + ISB  EBL: min  SPECIMEN:  none  Drains: none   PATIENT DISPOSITION:  PACU - hemodynamically stable.    PLAN OF CARE: Discharge to home after PACU  Dictation# [id number]

## 2013-02-10 NOTE — Anesthesia Procedure Notes (Signed)
Anesthesia Regional Block:  Interscalene brachial plexus block  Pre-Anesthetic Checklist: ,, timeout performed, Correct Patient, Correct Site, Correct Laterality, Correct Procedure, Correct Position, site marked, Risks and benefits discussed,  Surgical consent,  Pre-op evaluation,  At surgeon's request and post-op pain management  Laterality: Left  Prep: Maximum Sterile Barrier Precautions used, chloraprep and alcohol swabs       Needles:  Injection technique: Single-shot  Needle Type: Stimulator Needle - 40        Needle insertion depth: 4 cm   Additional Needles:  Procedures: nerve stimulator Interscalene brachial plexus block  Nerve Stimulator or Paresthesia:  Response: 5 mA, 1 ms, 4 cm  Additional Responses:   Narrative:  Start time: 02/10/2013 9:45 AM End time: 02/10/2013 9:50 AM Injection made incrementally with aspirations every 5 mL.  Performed by: Personally  Anesthesiologist: Maren Beach MD  Additional Notes: Pt accepts procedure and risks. 15cc 0.5% Marcaine w/ epi w/o difficulty or discomfort. GES

## 2013-02-10 NOTE — Plan of Care (Signed)
Problem: Consults Goal: Diagnosis- Total Joint Replacement Hemiarthroplasty Shoulder

## 2013-02-10 NOTE — Transfer of Care (Signed)
Immediate Anesthesia Transfer of Care Note  Patient: SHATERRIA SAGER  Procedure(s) Performed: Procedure(s) with comments: SHOULDER ARTHROSCOPY WITH SUBACROMIAL DECOMPRESSION DISTAL CLAVICLE RESECTION (Left) - DISTAL CLAVICLE RESECTION  Patient Location: PACU  Anesthesia Type:General  Level of Consciousness: awake, alert  and oriented  Airway & Oxygen Therapy: Patient Spontanous Breathing and Patient connected to nasal cannula oxygen  Post-op Assessment: Report given to PACU RN, Post -op Vital signs reviewed and stable and Patient moving all extremities  Post vital signs: Reviewed and stable  Complications: No apparent anesthesia complications

## 2013-02-10 NOTE — Op Note (Signed)
[name] NO.:  [id number]  MEDICAL RECORD NO.:  [id number]  LOCATION:  5N05C                        FACILITY:  [facility]  PHYSICIAN:  Vania Rea. Janet Humphreys, M.D.  DATE OF BIRTH:  [birth year]  DATE OF PROCEDURE:  02/10/2013 DATE OF DISCHARGE:                              OPERATIVE REPORT   PREOPERATIVE DIAGNOSIS:  Chronic left shoulder impingement syndrome.  POSTOPERATIVE DIAGNOSES: 1. Chronic left shoulder impingement syndrome. 2. Chronic left shoulder rotator cuff tear. 3. Left shoulder synovitis. 4. Left shoulder labral tear. 5. Left shoulder calcific tendinitis.  PROCEDURE: 1. Left shoulder examination under anesthesia. 2. Left shoulder manipulation under anesthesia. 3. Left shoulder diagnostic arthroscopy. 4. Debridement of complex and extensive labral tear. 5. Synovectomy and lysis of adhesions. 6. Arthroscopic subacromial decompression and bursectomy. 7. Debridement of a chronic rotator cuff tear as well debridement of     multiple areas of calcific tendonitis and chondrocalcinosis.  SURGEON:  Vania Rea. Niani Mourer, MD  ASSISTANT:  Ralene Bathe, PA-C.  ANESTHESIA:  General endotracheal as well as an interscalene block.  ESTIMATED BLOOD LOSS:  Minimal.  DRAINS:  None.  HISTORY:  [name] is a 72 year old female who has had chronic and progressive increasing left shoulder pain with an examination revealing a very painful arc of motion, possible impingement sign and weakness, rotator cuff testing, but negative drop-arm.  Her initial plain radiographs showed a type 3 acromial morphology with some evidence for calcific tendinitis and likely chondrocalcinosis.  Due to her ongoing pain and functional limitations, she was brought to the operating room at this time for planned left shoulder arthroscopy, debridement as described above.  I preoperatively counseled, [name] treatment options as well as risks versus benefits thereof.  Possible  surgical complications were reviewed including potential for bleeding, infection, neurovascular injury, persistent pain, loss of motion, anesthetic complication, and possible need for additional surgery.  She understands and accepts and agrees with our planned procedure.  PROCEDURE IN DETAIL:  After undergoing routine preop evaluation, the patient received prophylactic antibiotics.  An interscalene block was established in the holding area by the Anesthesia Department.  She was placed supine on the operating table, underwent smooth induction of general endotracheal anesthesia.  Turned to the right lateral decubitus position and appropriate padding, protected carefully utilizing an axillary roll to protect her right shoulder.  Left shoulder examination under anesthesia revealed forward elevation to approximately 140 degrees.  A manipulation was performed with palpable moderate release of adhesions achieving 160 degrees of forward elevation, and 80 degrees of external and internal rotation.  Left arm was then suspended at 70 degrees of abduction with 10 pounds of traction.  Left shoulder girdle region was sterilely prepped and draped in standard fashion.  Time-out was called.  A posterior portal site subsequent and anterior portal was established under direct visualization.  Medially evident was a large and significantly retracted tear of the supraspinatus and the majority of the subscapularis.  There was also focal areas of calcific tendinitis infiltrating the torn margin of the rotator cuff, as well as residual stump on the tuberosity.  The glenohumeral articular surfaces showed some moderate chondromalacia, particularly in the inferior aspect of  the humeral head.  There was extensive degenerative tearing of the labrum circumferentially and these areas were all debrided with a shaver back to stable margin.  There were some several areas of calcinosis that we also debrided.  The torn  margin rotator cuff with the calcific deposits was also debrided.  Performed a lysis of adhesions as well.  Once this was completed, we did confirm the biceps tendon that previously ruptured.  Residual stump was removed with a shaver.  At this point, fluid was then removed.  The arm was tucked down to 30 degrees abduction with arthroscope into the subacromial space and posterior portal, direct lateral portal established in the subacromial space.  This again confirmed a large chronically retracted tear, the rotator cuff involving the supraspinatus and subscapularis.  I made an attempt at mobilizing the rotator cuff, superiorly and anteriorly, but the tissues were significantly friable and contracted and very atrophic and scarified and it was not possible to gain any mobility of the rotator cuff, again consistent with this apparent chronic tear.  We did go ahead and debride the calcific deposits and then went ahead and smoothed the contour of the greater tuberosity as well using a shaver and the bur.  I did remove the periosteum, the undersurface of the anterior acromion for subacromial decompression creating a type 1 morphology.  The [facility] joint capsule was kept intact.  We did not divide the CA ligament either.  We then completed the subacromial/subdeltoid bursectomy and performed lysis of adhesions in this area and did obtain hemostasis with the Stryker wand.  At this point, final inspection irrigation was then completed. Fluid and instruments removed.  The portal was closed with Monocryl and Steri-Strips.  Dry dressing were then applied to the left shoulder. Left arm was placed in a sling.  The patient was awakened, extubated, and taken to recovery room in stable condition.  Ralene Bathe, PA-C was used as an Geophysicist/field seismologist throughout this case essential for help with positioning of the extremity, management of the arthroscopic equipment, wound closure, and intraoperative  decision making.     Vania Rea. Maudene Stotler, M.D.     KMS/MEDQ  D:  02/10/2013  T:  02/10/2013  Job:  [id number]

## 2013-02-10 NOTE — Anesthesia Preprocedure Evaluation (Signed)
Anesthesia Evaluation  Patient identified by MRN, date of birth, ID band Patient awake    Reviewed: Allergy & Precautions, H&P , NPO status , Patient's Chart, lab work & pertinent test results  Airway Mallampati: I TM Distance: >3 FB Neck ROM: full    Dental   Pulmonary sleep apnea , COPDformer smoker,          Cardiovascular hypertension, + angina + CAD, + Cardiac Stents, + CABG and + Peripheral Vascular Disease Rhythm:regular Rate:Normal     Neuro/Psych    GI/Hepatic GERD-  ,  Endo/Other  Hyperthyroidism   Renal/GU      Musculoskeletal   Abdominal   Peds  Hematology   Anesthesia Other Findings   Reproductive/Obstetrics                           Anesthesia Physical Anesthesia Plan  ASA: III  Anesthesia Plan: General   Post-op Pain Management: MAC Combined w/ Regional for Post-op pain   Induction: Intravenous  Airway Management Planned: Oral ETT  Additional Equipment:   Intra-op Plan:   Post-operative Plan: Extubation in OR  Informed Consent: I have reviewed the patients History and Physical, chart, labs and discussed the procedure including the risks, benefits and alternatives for the proposed anesthesia with the patient or authorized representative who has indicated his/her understanding and acceptance.     Plan Discussed with: CRNA, Anesthesiologist and Surgeon  Anesthesia Plan Comments:         Anesthesia Quick Evaluation

## 2013-02-10 NOTE — Anesthesia Postprocedure Evaluation (Signed)
  Anesthesia Post-op Note  Patient: [name]  Procedure(s) Performed: Procedure(s) with comments: SHOULDER ARTHROSCOPY WITH SUBACROMIAL DECOMPRESSION DISTAL CLAVICLE RESECTION (Left) - DISTAL CLAVICLE RESECTION  Patient Location: PACU  Anesthesia Type:GA combined with regional for post-op pain  Level of Consciousness: awake, alert , oriented and patient cooperative  Airway and Oxygen Therapy: Patient Spontanous Breathing  Post-op Pain: mild  Post-op Assessment: Post-op Vital signs reviewed, Patient's Cardiovascular Status Stable, Respiratory Function Stable, Patent Airway, No signs of Nausea or vomiting and Pain level controlled  Post-op Vital Signs: stable  Complications: No apparent anesthesia complications

## 2013-02-11 ENCOUNTER — Encounter (HOSPITAL_COMMUNITY): Payer: Self-pay | Admitting: General Practice

## 2013-02-11 MED ORDER — HYDROMORPHONE HCL 2 MG PO TABS: 2.0000 mg | ORAL_TABLET | ORAL | Status: DC | PRN

## 2013-02-11 NOTE — Discharge Summary (Signed)
PATIENT ID:      [name]  MRN:     [id number] DOB/AGE:    02-19-1941 / 72 y.o.     DISCHARGE SUMMARY  ADMISSION DATE:    02/10/2013 DISCHARGE DATE:  02/11/2013  ADMISSION DIAGNOSIS: LEFT SHOULDER CHRONIC IMPINGEMENT Past Medical History  Diagnosis Date  . Hypertension   . PVD (peripheral vascular disease)   . COPD (chronic obstructive pulmonary disease)     Emphysema dxed by Dr. Sherril Croon in [place] based on PFTs per pt in 2006; placed on albuterol  . HLD (hyperlipidemia)   . Osteoarthritis   . DVT of lower extremity (deep venous thrombosis)     recurrent. bilateral (2 episodes)  . Arthralgia     NOS  . Obesity (BMI 30-39.9) 2009    BMI 33  . Hyperthyroidism   . CAD (coronary artery disease)     s/p CABG 2004; s/p DES to LM in 2010;  LHC 10/29/11: EF 50-55%, mild elevated filling pressures, no pulmonary HTN, LM 90% ISR, LAD and CFX occluded, S-RI occluded (old), S-OM3 ok and L-LAD ok, native nondominant RCA 95% -  med rx recommended ; Lexiscan Myoview 7/13 at [facility]: demonstrated "normal LV function, anterior attenuation and localized ischemia, inferior, basilar, mid section"  . Carotid artery occlusion     moderate  . Exertional angina     Treated with Isosorbide, Ranexa, amlodipine; intolerant to metoprolol  . Chronic diastolic heart failure     Echo 9/10: EF 60-55%, grade 1 diastolic dysfunction  . Blood transfusion 2011    WITH BACK SURGERY  . GERD (gastroesophageal reflux disease)   . Depression     TAKES CELEXA  AND  (OFF- WELBUTRIN)  . Sleep apnea 2012    USED CPAP THEN  STARTED USING SPIRIVA , Nov. 2013- last evaluation , changed from mask to aparatus that is just for her nose..   . Anemia     bld. transfusion post lumbar surgery- 2012    DISCHARGE DIAGNOSIS:   Active Problems:   Impingement syndrome of left shoulder   PROCEDURE: Procedure(s): SHOULDER ARTHROSCOPY WITH SUBACROMIAL DECOMPRESSION DISTAL CLAVICLE RESECTION on 02/10/2013  CONSULTS:  none    HISTORY:  See H&P in chart.  HOSPITAL COURSE:  [name] is a 72 y.o. admitted on 02/10/2013 with a chief complaint of No chief complaint on file. , and found to have a diagnosis of LEFT SHOULDER CHRONIC IMPINGEMENT.  They were brought to the operating room on 02/10/2013 and underwent Procedure(s): SHOULDER ARTHROSCOPY WITH SUBACROMIAL DECOMPRESSION DISTAL CLAVICLE RESECTION.    They were given perioperative antibiotics: Anti-infectives   Start     Dose/Rate Route Frequency Ordered Stop   02/10/13 0600  ceFAZolin (ANCEF) 3 g in dextrose 5 % 50 mL IVPB     3 g 160 mL/hr over 30 Minutes Intravenous On call to O.R. 02/09/13 1330 02/10/13 1058    .  Patient underwent the above named procedure and tolerated it well. Pt was kept overnight because of sleep apnea and multiple comorbitities. The following day they were hemodynamically stable and pain was controlled on oral analgesics. Biggest complaint was pain, but breathing and vitals were all stable They were neurovascularly intact to the operative extremity. l. They were medically and orthopaedically stable for discharge on Day 1 .    DIAGNOSTIC STUDIES:  RECENT RADIOGRAPHIC STUDIES :  Dg Chest 2 View  02/07/2013  *RADIOLOGY REPORT*  Clinical Data: 72 year old female preoperative study.  Clavicle resection.  History of bronchitis.  CHEST - 2 VIEW  Comparison: 03/24/2011.  Findings: Stable sequelae of CABG.  Stable cardiac size and mediastinal contours.  Lumbar spine and right shoulder hardware partially visible.  Incidental calcified breast implants.  Stable lung volumes.  No pneumothorax, pulmonary edema, pleural effusion or acute pulmonary opacity.  Stable apical scarring.  IMPRESSION: No acute cardiopulmonary abnormality.   Original Report Authenticated By: Erskine Speed, M.D.     RECENT VITAL SIGNS:  Patient Vitals for the past 24 hrs:  BP Temp Temp src Pulse Resp SpO2  02/11/13 0606 123/49 mmHg 98.3 F (36.8 C) Oral 89 18 99 %   02/10/13 2226 121/47 mmHg - - 89 - -  02/10/13 2015 133/59 mmHg 98.1 F (36.7 C) Oral 79 18 98 %  02/10/13 1511 134/62 mmHg 98.2 F (36.8 C) - 85 20 98 %  02/10/13 1430 104/51 mmHg 98 F (36.7 C) - 68 16 94 %  02/10/13 1320 109/48 mmHg - - 71 12 94 %  02/10/13 1315 - - - 71 14 94 %  02/10/13 1303 124/56 mmHg - - - - -  02/10/13 1300 - - - 82 15 93 %  02/10/13 1248 120/55 mmHg - - - - -  02/10/13 1245 - - - 81 24 93 %  02/10/13 1233 134/66 mmHg - - - - -  02/10/13 1230 - 97.4 F (36.3 C) - 83 13 92 %  02/10/13 1218 132/58 mmHg - - - - -  02/10/13 0950 - - - 89 15 100 %  02/10/13 0940 - - - 79 - 98 %  02/10/13 0848 106/61 mmHg - - 77 - -  02/10/13 0812 106/61 mmHg 97.7 F (36.5 C) Oral 85 18 97 %  .  RECENT EKG RESULTS:    Orders placed in visit on 12/28/12  . EKG 12-LEAD    DISCHARGE INSTRUCTIONS:   Future Appointments Provider Department Dept Phone   03/03/2013 11:00 AM Pricilla Riffle, MD [facility] Main Office [place]) [phone]      DISCHARGE MEDICATIONS:     Medication List    TAKE these medications       ALPRAZolam 0.5 MG tablet  Commonly known as:  XANAX  Take 0.5 mg by mouth at bedtime.     aluminum & magnesium hydroxide-simethicone 500-450-40 MG/5ML suspension  Commonly known as:  MYLANTA  Take by mouth every 6 (six) hours as needed for indigestion.     amLODipine 5 MG tablet  Commonly known as:  NORVASC  Take 1 tablet (5 mg total) by mouth 2 (two) times daily.     citalopram 20 MG tablet  Commonly known as:  CELEXA  Take 20 mg by mouth 3 (three) times daily. Takes one tablet TID     cyclobenzaprine 10 MG tablet  Commonly known as:  FLEXERIL  Take 10 mg by mouth 3 (three) times daily as needed.     cyclobenzaprine 10 MG tablet  Commonly known as:  FLEXERIL  Take 1 tablet (10 mg total) by mouth 3 (three) times daily as needed for muscle spasms.     ergocalciferol 50000 UNITS capsule  Commonly known as:  VITAMIN D2  Take 50,000 Units  by mouth once a week. (SATURDAY)     furosemide 80 MG tablet  Commonly known as:  LASIX  Take 80 mg by mouth 2 (two) times daily.     isosorbide mononitrate 60 MG 24 hr tablet  Commonly known as:  IMDUR  Take 1 tablet (60 mg total) by mouth at bedtime.     levothyroxine 137 MCG tablet  Commonly known as:  SYNTHROID, LEVOTHROID  Take 137 mcg by mouth daily before breakfast.     lisinopril 20 MG tablet  Commonly known as:  PRINIVIL,ZESTRIL  Take 20 mg by mouth daily. Pt. Reports that she takes her BP & if its elevated she takes 40 mg. Instead of 20mg .     metoprolol tartrate 25 MG tablet  Commonly known as:  LOPRESSOR  Take 1 tablet (25 mg total) by mouth 2 (two) times daily.     nitroGLYCERIN 0.4 MG SL tablet  Commonly known as:  NITROSTAT  Place 0.4 mg under the tongue every 5 (five) minutes as needed. For chest pain     oxyCODONE-acetaminophen 5-325 MG per tablet  Commonly known as:  PERCOCET  Take 1-2 tablets by mouth every 4 (four) hours as needed for pain.     pilocarpine 4 % ophthalmic solution  Commonly known as:  PILOCAR  Place 1 drop into both eyes 2 (two) times daily.     potassium chloride 10 MEQ CR tablet  Commonly known as:  KLOR-CON  Take 10 mEq by mouth daily.     pramipexole 0.25 MG tablet  Commonly known as:  MIRAPEX  Take 0.25 mg by mouth at bedtime.     ranolazine 500 MG 12 hr tablet  Commonly known as:  RANEXA  Take 500 mg by mouth 2 (two) times daily.     roflumilast 500 MCG Tabs tablet  Commonly known as:  DALIRESP  Take 500 mcg by mouth at bedtime.     tiotropium 18 MCG inhalation capsule  Commonly known as:  SPIRIVA  Place 18 mcg into inhaler and inhale daily.     traZODone 50 MG tablet  Commonly known as:  DESYREL  Take 50 mg by mouth at bedtime.     XARELTO 20 MG Tabs  Generic drug:  Rivaroxaban  Take 20 mg by mouth daily with supper. Pt. States she was told to hold Xarelto- for 5 days preop, yet paper work states to hold for 3 days         FOLLOW UP VISIT:       Follow-up Information   Follow up with SUPPLE,KEVIN M, MD. (call to be seen in 7-10 days)    Contact information:   [address], [address], [address] [place] [address] [phone]       DISCHARGE TO: home  DISPOSITION:   DISCHARGE CONDITION:  Rodolph Bong for Dr. Francena Hanly 02/11/2013, 8:10 AM

## 2013-02-11 NOTE — Progress Notes (Signed)
Patient provided with discharge instructions and follow up information. She has been educated on pain medication and is going home with her sister.

## 2013-02-11 NOTE — Progress Notes (Signed)
CARE MANAGEMENT NOTE 02/11/2013  Patient:  [name], [name]   Account Number:  [id number]  Date Initiated:  02/11/2013  Documentation initiated by:  Vance Peper  Subjective/Objective Assessment:   72 yr old female s/p left shoulder arthroscopic suacromial decompression and bursectomy.     Action/Plan:   CM spoke with patient concerning home health needs, choice offered. Pt wanted [facility] because they worked with her husband recently. they cannot service. [facility] will work with patient.   Anticipated DC Date:  02/11/2013   Anticipated DC Plan:  HOME W HOME HEALTH SERVICES      DC Planning Services  CM consult      [facility] Choice  HOME HEALTH   Choice offered to / List presented to:  C-1 Patient        HH arranged  HH-2 PT  HH-3 OT      [facility] agency  [facility]   Status of service:  Completed, signed off Medicare Important Message given?   (If response is "NO", the following Medicare IM given date fields will be blank) Date Medicare IM given:   Date Additional Medicare IM given:    Discharge Disposition:  HOME W HOME HEALTH SERVICES  Per UR Regulation:    If discussed at Long Length of Stay Meetings, dates discussed:    Comments:  02/11/13 1430 Vance Peper, RN BSN Case Manager Patient is going to her sister's house in [place]. Spoke with Dr. Rennis Chris and he stated she will be fine without home health. Will followup with her a office.

## 2013-02-11 NOTE — Progress Notes (Signed)
CARE MANAGEMENT NOTE 02/11/2013  Patient:  [name], [name]   Account Number:  [id number]  Date Initiated:  02/11/2013  Documentation initiated by:  Vance Peper  Subjective/Objective Assessment:   72 yr old female s/p left shoulder arthroscopic suacromial decompression and bursectomy.     Action/Plan:   CM spoke with patient concerning home health needs, choice offered. Pt wanted [facility] because they worked with her husband recently. they cannot service. [facility] will work with patient.   Anticipated DC Date:  02/11/2013   Anticipated DC Plan:  HOME W HOME HEALTH SERVICES      DC Planning Services  CM consult      [facility] Choice  HOME HEALTH   Choice offered to / List presented to:  C-1 Patient        HH arranged  HH-2 PT  HH-3 OT      [facility] agency  [facility]   Status of service:  Completed, signed off Medicare Important Message given?   (If response is "NO", the following Medicare IM given date fields will be blank) Date Medicare IM given:   Date Additional Medicare IM given:    Discharge Disposition:  HOME W HOME HEALTH SERVICES  Per UR Regulation:    If discussed at Long Length of Stay Meetings, dates discussed:    Comments:

## 2013-02-16 DIAGNOSIS — M7500 Adhesive capsulitis of unspecified shoulder: Secondary | ICD-10-CM | POA: Diagnosis not present

## 2013-02-16 DIAGNOSIS — M25819 Other specified joint disorders, unspecified shoulder: Secondary | ICD-10-CM | POA: Diagnosis not present

## 2013-03-03 ENCOUNTER — Encounter: Payer: Self-pay | Admitting: Internal Medicine

## 2013-03-03 ENCOUNTER — Ambulatory Visit (INDEPENDENT_AMBULATORY_CARE_PROVIDER_SITE_OTHER): Payer: Medicare Other | Admitting: Internal Medicine

## 2013-03-03 VITALS — BP 113/60 | HR 80 | Ht 67.0 in | Wt 177.0 lb

## 2013-03-03 DIAGNOSIS — E785 Hyperlipidemia, unspecified: Secondary | ICD-10-CM | POA: Diagnosis not present

## 2013-03-03 DIAGNOSIS — I10 Essential (primary) hypertension: Secondary | ICD-10-CM

## 2013-03-03 LAB — BASIC METABOLIC PANEL
BUN: 28 mg/dL — ABNORMAL HIGH (ref 6–23)
CO2: 33 mEq/L — ABNORMAL HIGH (ref 19–32)
Calcium: 9.1 mg/dL (ref 8.4–10.5)
Chloride: 97 mEq/L (ref 96–112)
Creatinine, Ser: 1.4 mg/dL — ABNORMAL HIGH (ref 0.4–1.2)
GFR: 39.55 mL/min — ABNORMAL LOW (ref >60.00)
Glucose, Bld: 106 mg/dL — ABNORMAL HIGH (ref 70–99)
Potassium: 3.5 mEq/L (ref 3.5–5.1)
Sodium: 138 mEq/L (ref 135–145)

## 2013-03-03 LAB — LIPID PANEL
Cholesterol: 111 mg/dL (ref 0–200)
HDL: 27.1 mg/dL — ABNORMAL LOW (ref >39.00)
LDL Cholesterol: 60 mg/dL (ref 0–99)
Total CHOL/HDL Ratio: 4
Triglycerides: 122 mg/dL (ref 0.0–149.0)
VLDL: 24.4 mg/dL (ref 0.0–40.0)

## 2013-03-03 NOTE — Patient Instructions (Addendum)
Your physician wants you to follow-up in: September 2014 with Dr. Tenny Craw.  You will receive a reminder letter in the mail two months in advance. If you don't receive a letter, please call our office to schedule the follow-up appointment.  LABS TODAY:  BMET and LIPID Panel

## 2013-03-03 NOTE — Progress Notes (Signed)
HPI Patient is a 72 yo with a history of CAD (s/p CABG 2004 and DES to L Main. Last LHT 11/12 showed instent restenosis to the LM. LCX was occluded in prox segment. SVG to OM was patient as well as LAD  RCA had signif disease but appeared nondominant  The patient was admitted to [facility] in July 2013 with CP [facility] showed localized ischemia in the inferior wall (base, mid) Ranex was added. Did not tolerate metoprolol Norvasc added.  She also has a hsitory of recurrent DVT (on chronic coumadin), HTN, COPD, HL and carotid stenosis.   Patient had arthroscopic surgery a few weeks ago.  DJD was more severe than thought  Cleaned out some but may need to have shoulder replacement  The patients breathing is stable  CP is stable. Allergies  Allergen Reactions  . Zolpidem Tartrate Anxiety    CONFUSION   . Codeine Other (See Comments)    hallucinate  . Penicillins Rash    Current Outpatient Prescriptions  Medication Sig Dispense Refill  . ALPRAZolam (XANAX) 0.5 MG tablet Take 0.5 mg by mouth at bedtime.       Marland Kitchen aluminum & magnesium hydroxide-simethicone (MYLANTA) 500-450-40 MG/5ML suspension Take by mouth every 6 (six) hours as needed for indigestion.      Marland Kitchen amLODipine (NORVASC) 5 MG tablet Take 1 tablet (5 mg total) by mouth 2 (two) times daily.  60 tablet  6  . citalopram (CELEXA) 20 MG tablet Take 20 mg by mouth 3 (three) times daily. Takes one tablet TID      . cyclobenzaprine (FLEXERIL) 10 MG tablet Take 10 mg by mouth 3 (three) times daily as needed.       . cyclobenzaprine (FLEXERIL) 10 MG tablet Take 1 tablet (10 mg total) by mouth 3 (three) times daily as needed for muscle spasms.  30 tablet  1  . ergocalciferol (VITAMIN D2) 50000 UNITS capsule Take 50,000 Units by mouth once a week. (SATURDAY)      . furosemide (LASIX) 80 MG tablet Take 80 mg by mouth 2 (two) times daily.       Marland Kitchen HYDROmorphone (DILAUDID) 2 MG tablet Take 1-2 tablets (2-4 mg total) by mouth every 4 (four)  hours as needed for pain.  60 tablet  0  . isosorbide mononitrate (IMDUR) 60 MG 24 hr tablet Take 1 tablet (60 mg total) by mouth at bedtime.  90 tablet  3  . levothyroxine (SYNTHROID, LEVOTHROID) 137 MCG tablet Take 137 mcg by mouth daily before breakfast.       . lisinopril (PRINIVIL,ZESTRIL) 20 MG tablet Take 20 mg by mouth daily. Pt. Reports that she takes her BP & if its elevated she takes 40 mg. Instead of 20mg .      . metoprolol tartrate (LOPRESSOR) 25 MG tablet Take 1 tablet (25 mg total) by mouth 2 (two) times daily.  60 tablet  5  . nitroGLYCERIN (NITROSTAT) 0.4 MG SL tablet Place 0.4 mg under the tongue every 5 (five) minutes as needed. For chest pain      . pilocarpine (PILOCAR) 4 % ophthalmic solution Place 1 drop into both eyes 2 (two) times daily.      . potassium chloride (KLOR-CON) 10 MEQ CR tablet Take 10 mEq by mouth daily.       . pramipexole (MIRAPEX) 0.25 MG tablet Take 0.25 mg by mouth at bedtime.       . ranolazine (RANEXA) 500 MG 12 hr tablet Take 500 mg  by mouth 2 (two) times daily.      . Rivaroxaban (XARELTO) 20 MG TABS Take 20 mg by mouth daily with supper. Pt. States she was told to hold Xarelto- for 5 days preop, yet paper work states to hold for 3 days      . roflumilast (DALIRESP) 500 MCG TABS tablet Take 500 mcg by mouth at bedtime.       Marland Kitchen tiotropium (SPIRIVA) 18 MCG inhalation capsule Place 18 mcg into inhaler and inhale daily.      . traZODone (DESYREL) 50 MG tablet Take 50 mg by mouth at bedtime.       No current facility-administered medications for this visit.    Past Medical History  Diagnosis Date  . Hypertension   . PVD (peripheral vascular disease)   . COPD (chronic obstructive pulmonary disease)     Emphysema dxed by Dr. Sherril Croon in [place] based on PFTs per pt in 2006; placed on albuterol  . HLD (hyperlipidemia)   . Osteoarthritis   . DVT of lower extremity (deep venous thrombosis)     recurrent. bilateral (2 episodes)  . Arthralgia     NOS  .  Obesity (BMI 30-39.9) 2009    BMI 33  . Hyperthyroidism   . CAD (coronary artery disease)     s/p CABG 2004; s/p DES to LM in 2010;  LHC 10/29/11: EF 50-55%, mild elevated filling pressures, no pulmonary HTN, LM 90% ISR, LAD and CFX occluded, S-RI occluded (old), S-OM3 ok and L-LAD ok, native nondominant RCA 95% -  med rx recommended ; Lexiscan Myoview 7/13 at [facility]: demonstrated "normal LV function, anterior attenuation and localized ischemia, inferior, basilar, mid section"  . Carotid artery occlusion     moderate  . Exertional angina     Treated with Isosorbide, Ranexa, amlodipine; intolerant to metoprolol  . Chronic diastolic heart failure     Echo 9/10: EF 60-55%, grade 1 diastolic dysfunction  . Blood transfusion 2011    WITH BACK SURGERY  . GERD (gastroesophageal reflux disease)   . Depression     TAKES CELEXA  AND  (OFF- WELBUTRIN)  . Sleep apnea 2012    USED CPAP THEN  STARTED USING SPIRIVA , Nov. 2013- last evaluation , changed from mask to aparatus that is just for her nose..   . Anemia     bld. transfusion post lumbar surgery- 2012    Past Surgical History  Procedure Laterality Date  . Ankle surgery  2004  . Groin mass open biopsy  2004  . Neck surgery  12/08    Due for repeat neck surgery  . Breast enhancement surgery    . Total hip arthroplasty  2007    Right  . Coronary artery bypass graft  2004  . Cardiac catheterization  11/2009    Patent LIMA to LAD and patent SVG to OM1. Occluded SVG to ramus and diagonal. Left main: 90% ostial stenosis, LCX 60-70% proximal stenosis  . Coronary angioplasty with stent placement  11/2009    Drug eluting stent to left main artery: 4.0 X 12 mm Ion   . Joint replacement  2012     BILATERAL KNEES  . Joint replacement  2010     RIGHT HIP REPLACEMENT  . Reverse shoulder arthroplasty  02/26/2012    Procedure: REVERSE SHOULDER ARTHROPLASTY;  Surgeon: Senaida Lange, MD;  Location: [facility] OR;  Service: Orthopedics;  Laterality:  Right;  RIGHT SHOULDER REVERSED ARTHROPLASTY  . Eye surgery  2006    BILATERAL CAT EXT.   . Back surgery  1986    lower; another scheduled, opt. reports 4 back- lumbar, 3 cerv. fusions  for later 2009  . Shoulder arthroscopy with subacromial decompression Left 02/10/2013    Procedure: SHOULDER ARTHROSCOPY WITH SUBACROMIAL DECOMPRESSION DISTAL CLAVICLE RESECTION;  Surgeon: Senaida Lange, MD;  Location: [facility] OR;  Service: Orthopedics;  Laterality: Left;  DISTAL CLAVICLE RESECTION    Family History  Problem Relation Age of Onset  . Cancer    . Heart disease    . Colitis    . Cancer Mother     Ovarian  . Cancer Father     Lung    History   Social History  . Marital Status: Married    Spouse Name: N/A    Number of Children: 3  . Years of Education: N/A   Occupational History  . retired     International aid/development worker  .     Social History Main Topics  . Smoking status: Former Smoker -- 45 years    Quit date: 12/22/2002  . Smokeless tobacco: Never Used  . Alcohol Use: No  . Drug Use: No  . Sexually Active: No   Other Topics Concern  . Not on file   Social History Narrative   Married 52 years, lives with husband.   3 grown children.    Review of Systems:  All systems reviewed.  They are negative to the above problem except as previously stated.  Vital Signs: BP 113/60  Pulse 80  Ht 5\' 7"  (1.702 m)  Wt 177 lb (80.287 kg)  BMI 27.72 kg/m2  SpO2 99%  Physical Exam Patinet isin NAD HEENT:  Normocephalic, atraumatic. EOMI, PERRLA.  Neck: JVP is normal.  No bruits.  Lungs: clear to auscultation. No rales no wheezes.  Heart: Regular rate and rhythm. Normal S1, S2. No S3.   Gr I to II/ VI systolic murmur.  Abdomen:  Supple, nontender. Normal bowel sounds. No masses. No hepatomegaly.  Extremities:   Good distal pulses throughout. Tr to 1+ lower extremity edema.  Musculoskeletal :moving all extremities.  Neuro:   alert and oriented x3.  CN II-XII grossly intact.   Assessment and  Plan:   1  CAD  Asymptomatic 2  HTN  Good control  3  HL  Will check lipids  NOt on statin  Did not have problem  4.  Ortho  Recent procedure showed severe shoulder DJD  Is getting a little relief in pain (not constant)  Due to be seen by Dr Rennis Chris again next wk  May need shoulder replacemtn  Patient would be at some increased risk with ortho procedure but understands risk  Have planned to follow with medical Rx in order to alleviate severe pain. 5.  Recurrent DVT  On anticoag.  Will follow along as needed.   Otherwise f/u in clinic in Fall.

## 2013-03-08 DIAGNOSIS — I110 Hypertensive heart disease with heart failure: Secondary | ICD-10-CM | POA: Diagnosis not present

## 2013-03-08 DIAGNOSIS — G8929 Other chronic pain: Secondary | ICD-10-CM | POA: Diagnosis not present

## 2013-03-08 DIAGNOSIS — J449 Chronic obstructive pulmonary disease, unspecified: Secondary | ICD-10-CM | POA: Diagnosis not present

## 2013-03-08 DIAGNOSIS — G894 Chronic pain syndrome: Secondary | ICD-10-CM | POA: Diagnosis not present

## 2013-03-08 DIAGNOSIS — M545 Low back pain, unspecified: Secondary | ICD-10-CM | POA: Diagnosis not present

## 2013-03-08 DIAGNOSIS — M75110 Incomplete rotator cuff tear or rupture of unspecified shoulder, not specified as traumatic: Secondary | ICD-10-CM | POA: Diagnosis not present

## 2013-03-08 DIAGNOSIS — Z4789 Encounter for other orthopedic aftercare: Secondary | ICD-10-CM | POA: Diagnosis not present

## 2013-03-08 DIAGNOSIS — I2510 Atherosclerotic heart disease of native coronary artery without angina pectoris: Secondary | ICD-10-CM | POA: Diagnosis not present

## 2013-03-08 DIAGNOSIS — I509 Heart failure, unspecified: Secondary | ICD-10-CM | POA: Diagnosis not present

## 2013-03-08 DIAGNOSIS — K219 Gastro-esophageal reflux disease without esophagitis: Secondary | ICD-10-CM | POA: Diagnosis not present

## 2013-03-08 DIAGNOSIS — I4891 Unspecified atrial fibrillation: Secondary | ICD-10-CM | POA: Diagnosis not present

## 2013-03-08 DIAGNOSIS — M810 Age-related osteoporosis without current pathological fracture: Secondary | ICD-10-CM | POA: Diagnosis not present

## 2013-03-14 DIAGNOSIS — Z4789 Encounter for other orthopedic aftercare: Secondary | ICD-10-CM | POA: Diagnosis not present

## 2013-03-14 DIAGNOSIS — M810 Age-related osteoporosis without current pathological fracture: Secondary | ICD-10-CM | POA: Diagnosis not present

## 2013-03-14 DIAGNOSIS — M545 Low back pain, unspecified: Secondary | ICD-10-CM | POA: Diagnosis not present

## 2013-03-14 DIAGNOSIS — M75110 Incomplete rotator cuff tear or rupture of unspecified shoulder, not specified as traumatic: Secondary | ICD-10-CM | POA: Diagnosis not present

## 2013-03-14 DIAGNOSIS — I110 Hypertensive heart disease with heart failure: Secondary | ICD-10-CM | POA: Diagnosis not present

## 2013-03-14 DIAGNOSIS — G8929 Other chronic pain: Secondary | ICD-10-CM | POA: Diagnosis not present

## 2013-03-15 ENCOUNTER — Encounter: Payer: Self-pay | Admitting: Internal Medicine

## 2013-03-15 DIAGNOSIS — M75110 Incomplete rotator cuff tear or rupture of unspecified shoulder, not specified as traumatic: Secondary | ICD-10-CM | POA: Diagnosis not present

## 2013-03-15 DIAGNOSIS — M545 Low back pain, unspecified: Secondary | ICD-10-CM | POA: Diagnosis not present

## 2013-03-15 DIAGNOSIS — M810 Age-related osteoporosis without current pathological fracture: Secondary | ICD-10-CM | POA: Diagnosis not present

## 2013-03-15 DIAGNOSIS — Z4789 Encounter for other orthopedic aftercare: Secondary | ICD-10-CM | POA: Diagnosis not present

## 2013-03-15 DIAGNOSIS — I110 Hypertensive heart disease with heart failure: Secondary | ICD-10-CM | POA: Diagnosis not present

## 2013-03-15 DIAGNOSIS — I509 Heart failure, unspecified: Secondary | ICD-10-CM | POA: Diagnosis not present

## 2013-03-15 DIAGNOSIS — G8929 Other chronic pain: Secondary | ICD-10-CM | POA: Diagnosis not present

## 2013-03-21 DIAGNOSIS — M545 Low back pain, unspecified: Secondary | ICD-10-CM | POA: Diagnosis not present

## 2013-03-21 DIAGNOSIS — M810 Age-related osteoporosis without current pathological fracture: Secondary | ICD-10-CM | POA: Diagnosis not present

## 2013-03-21 DIAGNOSIS — I509 Heart failure, unspecified: Secondary | ICD-10-CM | POA: Diagnosis not present

## 2013-03-21 DIAGNOSIS — M75110 Incomplete rotator cuff tear or rupture of unspecified shoulder, not specified as traumatic: Secondary | ICD-10-CM | POA: Diagnosis not present

## 2013-03-21 DIAGNOSIS — F519 Sleep disorder not due to a substance or known physiological condition, unspecified: Secondary | ICD-10-CM | POA: Diagnosis not present

## 2013-03-21 DIAGNOSIS — I110 Hypertensive heart disease with heart failure: Secondary | ICD-10-CM | POA: Diagnosis not present

## 2013-03-21 DIAGNOSIS — G8929 Other chronic pain: Secondary | ICD-10-CM | POA: Diagnosis not present

## 2013-03-21 DIAGNOSIS — Z4789 Encounter for other orthopedic aftercare: Secondary | ICD-10-CM | POA: Diagnosis not present

## 2013-03-21 DIAGNOSIS — G894 Chronic pain syndrome: Secondary | ICD-10-CM | POA: Diagnosis not present

## 2013-03-25 DIAGNOSIS — M545 Low back pain, unspecified: Secondary | ICD-10-CM | POA: Diagnosis not present

## 2013-03-25 DIAGNOSIS — M810 Age-related osteoporosis without current pathological fracture: Secondary | ICD-10-CM | POA: Diagnosis not present

## 2013-03-25 DIAGNOSIS — I509 Heart failure, unspecified: Secondary | ICD-10-CM | POA: Diagnosis not present

## 2013-03-25 DIAGNOSIS — M75110 Incomplete rotator cuff tear or rupture of unspecified shoulder, not specified as traumatic: Secondary | ICD-10-CM | POA: Diagnosis not present

## 2013-03-25 DIAGNOSIS — I110 Hypertensive heart disease with heart failure: Secondary | ICD-10-CM | POA: Diagnosis not present

## 2013-03-25 DIAGNOSIS — Z4789 Encounter for other orthopedic aftercare: Secondary | ICD-10-CM | POA: Diagnosis not present

## 2013-03-25 DIAGNOSIS — G8929 Other chronic pain: Secondary | ICD-10-CM | POA: Diagnosis not present

## 2013-03-28 DIAGNOSIS — M545 Low back pain, unspecified: Secondary | ICD-10-CM | POA: Diagnosis not present

## 2013-03-28 DIAGNOSIS — M75110 Incomplete rotator cuff tear or rupture of unspecified shoulder, not specified as traumatic: Secondary | ICD-10-CM | POA: Diagnosis not present

## 2013-03-28 DIAGNOSIS — M810 Age-related osteoporosis without current pathological fracture: Secondary | ICD-10-CM | POA: Diagnosis not present

## 2013-03-28 DIAGNOSIS — Z4789 Encounter for other orthopedic aftercare: Secondary | ICD-10-CM | POA: Diagnosis not present

## 2013-03-28 DIAGNOSIS — G8929 Other chronic pain: Secondary | ICD-10-CM | POA: Diagnosis not present

## 2013-03-28 DIAGNOSIS — I110 Hypertensive heart disease with heart failure: Secondary | ICD-10-CM | POA: Diagnosis not present

## 2013-05-12 ENCOUNTER — Ambulatory Visit: Payer: Medicare Other | Admitting: Internal Medicine

## 2013-06-08 DIAGNOSIS — G894 Chronic pain syndrome: Secondary | ICD-10-CM | POA: Diagnosis not present

## 2013-06-08 DIAGNOSIS — R1011 Right upper quadrant pain: Secondary | ICD-10-CM | POA: Diagnosis not present

## 2013-06-08 DIAGNOSIS — R1013 Epigastric pain: Secondary | ICD-10-CM | POA: Diagnosis not present

## 2013-06-10 DIAGNOSIS — R1011 Right upper quadrant pain: Secondary | ICD-10-CM | POA: Diagnosis not present

## 2013-06-10 DIAGNOSIS — R1013 Epigastric pain: Secondary | ICD-10-CM | POA: Diagnosis not present

## 2013-06-20 DIAGNOSIS — K5900 Constipation, unspecified: Secondary | ICD-10-CM | POA: Diagnosis not present

## 2013-06-20 DIAGNOSIS — K219 Gastro-esophageal reflux disease without esophagitis: Secondary | ICD-10-CM | POA: Diagnosis not present

## 2013-06-20 DIAGNOSIS — IMO0001 Reserved for inherently not codable concepts without codable children: Secondary | ICD-10-CM | POA: Diagnosis not present

## 2013-07-27 ENCOUNTER — Other Ambulatory Visit: Payer: Self-pay

## 2013-09-01 ENCOUNTER — Ambulatory Visit (INDEPENDENT_AMBULATORY_CARE_PROVIDER_SITE_OTHER): Payer: Medicare Other | Admitting: Internal Medicine

## 2013-09-01 VITALS — BP 124/62 | Ht 67.0 in | Wt 165.0 lb

## 2013-09-01 DIAGNOSIS — E785 Hyperlipidemia, unspecified: Secondary | ICD-10-CM

## 2013-09-01 DIAGNOSIS — I2510 Atherosclerotic heart disease of native coronary artery without angina pectoris: Secondary | ICD-10-CM

## 2013-09-01 DIAGNOSIS — R0989 Other specified symptoms and signs involving the circulatory and respiratory systems: Secondary | ICD-10-CM | POA: Diagnosis not present

## 2013-09-01 MED ORDER — ATORVASTATIN CALCIUM 10 MG PO TABS: 5.0000 mg | ORAL_TABLET | Freq: Every day | ORAL | Status: DC

## 2013-09-01 NOTE — Progress Notes (Signed)
HPI Patient is a 72 yo with a history of CAD (s/p CABG 2004 and DES to L Main. Last LHT 11/12 showed instent restenosis to the LM. LCX was occluded in prox segment. SVG to OM was patient as well as LAD  RCA had signif disease but appeared nondominant  The patient was admitted to [facility] in July 2013 with CP [facility] showed localized ischemia in the inferior wall (base, mid) Ranexa was added. Did not tolerate metoprolol Norvasc added.  She also has a hsitory of recurrent DVT (on chronic coumadin), HTN, COPD, HL and carotid stenosis.   Patient was seen in March 2014 Allergies  Allergen Reactions  . Zolpidem Tartrate Anxiety    CONFUSION   . Codeine Other (See Comments)    hallucinate  . Penicillins Rash    Current Outpatient Prescriptions  Medication Sig Dispense Refill  . ALPRAZolam (XANAX) 0.5 MG tablet Take 0.5 mg by mouth at bedtime.       Marland Kitchen aluminum & magnesium hydroxide-simethicone (MYLANTA) 500-450-40 MG/5ML suspension Take by mouth every 6 (six) hours as needed for indigestion.      . citalopram (CELEXA) 20 MG tablet Take 20 mg by mouth 3 (three) times daily. Takes one tablet TID      . cyclobenzaprine (FLEXERIL) 10 MG tablet Take 1 tablet (10 mg total) by mouth 3 (three) times daily as needed for muscle spasms.  30 tablet  1  . ergocalciferol (VITAMIN D2) 50000 UNITS capsule Take 50,000 Units by mouth once a week. (SATURDAY)      . furosemide (LASIX) 80 MG tablet Take 80 mg by mouth 2 (two) times daily.       Marland Kitchen HYDROmorphone (DILAUDID) 2 MG tablet Take 1-2 tablets (2-4 mg total) by mouth every 4 (four) hours as needed for pain.  60 tablet  0  . isosorbide mononitrate (IMDUR) 60 MG 24 hr tablet Take 1 tablet (60 mg total) by mouth at bedtime.  90 tablet  3  . levothyroxine (SYNTHROID, LEVOTHROID) 137 MCG tablet Take 137 mcg by mouth daily before breakfast.       . lisinopril (PRINIVIL,ZESTRIL) 20 MG tablet Take 20 mg by mouth daily. Pt. Reports that she takes her BP  & if its elevated she takes 40 mg. Instead of 20mg .      . metoprolol tartrate (LOPRESSOR) 25 MG tablet Take 1 tablet (25 mg total) by mouth 2 (two) times daily.  60 tablet  5  . nitroGLYCERIN (NITROSTAT) 0.4 MG SL tablet Place 0.4 mg under the tongue every 5 (five) minutes as needed. For chest pain      . omeprazole (PRILOSEC) 40 MG capsule Take 40 mg by mouth daily.      Marland Kitchen oxyCODONE-acetaminophen (PERCOCET) 10-325 MG per tablet Take 1 tablet by mouth every 4 (four) hours as needed for pain.      . pilocarpine (PILOCAR) 4 % ophthalmic solution Place 1 drop into both eyes 2 (two) times daily.      . potassium chloride (KLOR-CON) 10 MEQ CR tablet Take 10 mEq by mouth 2 (two) times daily.       . pramipexole (MIRAPEX) 0.25 MG tablet Take 0.25 mg by mouth at bedtime.       . ranolazine (RANEXA) 500 MG 12 hr tablet Take 500 mg by mouth 2 (two) times daily.      . Rivaroxaban (XARELTO) 20 MG TABS Take 20 mg by mouth daily with supper. Pt. States she was told to hold  Xarelto- for 5 days preop, yet paper work states to hold for 3 days      . roflumilast (DALIRESP) 500 MCG TABS tablet Take 500 mcg by mouth at bedtime.       Marland Kitchen tiotropium (SPIRIVA) 18 MCG inhalation capsule Place 18 mcg into inhaler and inhale daily.      . traZODone (DESYREL) 50 MG tablet Take 50 mg by mouth at bedtime.      . cyclobenzaprine (FLEXERIL) 10 MG tablet Take 10 mg by mouth 3 (three) times daily as needed.        No current facility-administered medications for this visit.    Past Medical History  Diagnosis Date  . Hypertension   . PVD (peripheral vascular disease)   . COPD (chronic obstructive pulmonary disease)     Emphysema dxed by Dr. Sherril Croon in [place] based on PFTs per pt in 2006; placed on albuterol  . HLD (hyperlipidemia)   . Osteoarthritis   . DVT of lower extremity (deep venous thrombosis)     recurrent. bilateral (2 episodes)  . Arthralgia     NOS  . Obesity (BMI 30-39.9) 2009    BMI 33  . Hyperthyroidism    . CAD (coronary artery disease)     s/p CABG 2004; s/p DES to LM in 2010;  LHC 10/29/11: EF 50-55%, mild elevated filling pressures, no pulmonary HTN, LM 90% ISR, LAD and CFX occluded, S-RI occluded (old), S-OM3 ok and L-LAD ok, native nondominant RCA 95% -  med rx recommended ; Lexiscan Myoview 7/13 at [facility]: demonstrated "normal LV function, anterior attenuation and localized ischemia, inferior, basilar, mid section"  . Carotid artery occlusion     moderate  . Exertional angina     Treated with Isosorbide, Ranexa, amlodipine; intolerant to metoprolol  . Chronic diastolic heart failure     Echo 9/10: EF 60-55%, grade 1 diastolic dysfunction  . Blood transfusion 2011    WITH BACK SURGERY  . GERD (gastroesophageal reflux disease)   . Depression     TAKES CELEXA  AND  (OFF- WELBUTRIN)  . Sleep apnea 2012    USED CPAP THEN  STARTED USING SPIRIVA , Nov. 2013- last evaluation , changed from mask to aparatus that is just for her nose..   . Anemia     bld. transfusion post lumbar surgery- 2012    Past Surgical History  Procedure Laterality Date  . Ankle surgery  2004  . Groin mass open biopsy  2004  . Neck surgery  12/08    Due for repeat neck surgery  . Breast enhancement surgery    . Total hip arthroplasty  2007    Right  . Coronary artery bypass graft  2004  . Cardiac catheterization  11/2009    Patent LIMA to LAD and patent SVG to OM1. Occluded SVG to ramus and diagonal. Left main: 90% ostial stenosis, LCX 60-70% proximal stenosis  . Coronary angioplasty with stent placement  11/2009    Drug eluting stent to left main artery: 4.0 X 12 mm Ion   . Joint replacement  2012     BILATERAL KNEES  . Joint replacement  2010     RIGHT HIP REPLACEMENT  . Reverse shoulder arthroplasty  02/26/2012    Procedure: REVERSE SHOULDER ARTHROPLASTY;  Surgeon: Senaida Lange, MD;  Location: [facility] OR;  Service: Orthopedics;  Laterality: Right;  RIGHT SHOULDER REVERSED ARTHROPLASTY  . Eye surgery   2006    BILATERAL CAT EXT.   Marland Kitchen  Back surgery  1986    lower; another scheduled, opt. reports 4 back- lumbar, 3 cerv. fusions  for later 2009  . Shoulder arthroscopy with subacromial decompression Left 02/10/2013    Procedure: SHOULDER ARTHROSCOPY WITH SUBACROMIAL DECOMPRESSION DISTAL CLAVICLE RESECTION;  Surgeon: Senaida Lange, MD;  Location: [facility] OR;  Service: Orthopedics;  Laterality: Left;  DISTAL CLAVICLE RESECTION    Family History  Problem Relation Age of Onset  . Cancer    . Heart disease    . Colitis    . Cancer Mother     Ovarian  . Cancer Father     Lung    History   Social History  . Marital Status: Married    Spouse Name: N/A    Number of Children: 3  . Years of Education: N/A   Occupational History  . retired     International aid/development worker  .     Social History Main Topics  . Smoking status: Former Smoker -- 45 years    Quit date: 12/22/2002  . Smokeless tobacco: Never Used  . Alcohol Use: No  . Drug Use: No  . Sexual Activity: No   Other Topics Concern  . Not on file   Social History Narrative   Married 52 years, lives with husband.   3 grown children.    Review of Systems:  All systems reviewed.  They are negative to the above problem except as previously stated.  Vital Signs: BP 124/62  Ht 5\' 7"  (1.702 m)  Wt 165 lb (74.844 kg)  BMI 25.84 kg/m2  Physical Exam Patinet isin NAD HEENT:  Normocephalic, atraumatic. EOMI, PERRLA.  Neck: JVP is normal.  R carotid bruit.  Lungs: clear to auscultation. No rales no wheezes.  Decreased aiflow Heart: Regular rate and rhythm. Normal S1, S2. No S3.   Gr I to II/ VI systolic murmur.  Abdomen:  Supple, nontender. Normal bowel sounds. No masses. No hepatomegaly.  Extremities:   Good distal pulses throughout. Tr to 1+ lower extremity edema.  Musculoskeletal :moving all extremities.  Neuro:   alert and oriented x3.  CN II-XII grossly intact.  EKG  SR 79  First degree AV block  229 msec.  LAFB.  LVH.   Assessment and  Plan:   1  CAD  Asymptomatic  Follow 2  HTN  Good control  3  HL  LDL was good at lab check  She needs to be on a statin with her history  Would Rx lipitor (this should have been done last winter, did not get call back)  F/U lipomed in 10 wks.  4.  COPD   5.  Recurrent DVT  On anticoag.  6.  CV disease.  Set up for carotid USN  F/U in the spring.  Confirm meds with [facility]

## 2013-09-01 NOTE — Patient Instructions (Addendum)
Your physician has requested that you have a carotid duplex in November.  This test is an ultrasound of the carotid arteries in your neck. It looks at blood flow through these arteries that supply the brain with blood. Allow one hour for this exam. There are no restrictions or special instructions.  Your physician recommends that you return for lab work in: November 2014 the same day as your carotid duplex. You will need to be fasting for your cholesterol lab work.   New medication:  Start Atorvastatin 5 mg at bedtime each night

## 2013-09-20 ENCOUNTER — Other Ambulatory Visit: Payer: Self-pay | Admitting: Cardiovascular Disease

## 2013-09-20 DIAGNOSIS — G894 Chronic pain syndrome: Secondary | ICD-10-CM | POA: Diagnosis not present

## 2013-09-20 DIAGNOSIS — Z23 Encounter for immunization: Secondary | ICD-10-CM | POA: Diagnosis not present

## 2013-10-14 DIAGNOSIS — J441 Chronic obstructive pulmonary disease with (acute) exacerbation: Secondary | ICD-10-CM | POA: Diagnosis not present

## 2013-10-14 DIAGNOSIS — J209 Acute bronchitis, unspecified: Secondary | ICD-10-CM | POA: Diagnosis not present

## 2013-10-27 ENCOUNTER — Other Ambulatory Visit: Payer: Self-pay

## 2013-11-01 ENCOUNTER — Other Ambulatory Visit (INDEPENDENT_AMBULATORY_CARE_PROVIDER_SITE_OTHER): Payer: Medicare Other

## 2013-11-01 ENCOUNTER — Encounter: Payer: Self-pay | Admitting: Cardiology

## 2013-11-01 ENCOUNTER — Ambulatory Visit (HOSPITAL_COMMUNITY): Payer: Medicare Other | Attending: Cardiology

## 2013-11-01 DIAGNOSIS — J449 Chronic obstructive pulmonary disease, unspecified: Secondary | ICD-10-CM | POA: Diagnosis not present

## 2013-11-01 DIAGNOSIS — Z87891 Personal history of nicotine dependence: Secondary | ICD-10-CM | POA: Insufficient documentation

## 2013-11-01 DIAGNOSIS — E785 Hyperlipidemia, unspecified: Secondary | ICD-10-CM | POA: Insufficient documentation

## 2013-11-01 DIAGNOSIS — I10 Essential (primary) hypertension: Secondary | ICD-10-CM | POA: Insufficient documentation

## 2013-11-01 DIAGNOSIS — I771 Stricture of artery: Secondary | ICD-10-CM | POA: Diagnosis not present

## 2013-11-01 DIAGNOSIS — Z8673 Personal history of transient ischemic attack (TIA), and cerebral infarction without residual deficits: Secondary | ICD-10-CM | POA: Insufficient documentation

## 2013-11-01 DIAGNOSIS — I658 Occlusion and stenosis of other precerebral arteries: Secondary | ICD-10-CM | POA: Insufficient documentation

## 2013-11-01 DIAGNOSIS — R0989 Other specified symptoms and signs involving the circulatory and respiratory systems: Secondary | ICD-10-CM

## 2013-11-01 DIAGNOSIS — I2510 Atherosclerotic heart disease of native coronary artery without angina pectoris: Secondary | ICD-10-CM

## 2013-11-01 DIAGNOSIS — I6529 Occlusion and stenosis of unspecified carotid artery: Secondary | ICD-10-CM | POA: Insufficient documentation

## 2013-11-01 DIAGNOSIS — J4489 Other specified chronic obstructive pulmonary disease: Secondary | ICD-10-CM | POA: Insufficient documentation

## 2013-11-01 LAB — HEPATIC FUNCTION PANEL
ALT: 10 U/L (ref 0–35)
AST: 11 U/L (ref 0–37)
Albumin: 3.3 g/dL — ABNORMAL LOW (ref 3.5–5.2)
Alkaline Phosphatase: 74 U/L (ref 39–117)
Bilirubin, Direct: 0.1 mg/dL (ref 0.0–0.3)
Total Bilirubin: 0.8 mg/dL (ref 0.3–1.2)
Total Protein: 6.7 g/dL (ref 6.0–8.3)

## 2013-11-02 LAB — NMR LIPOPROFILE WITH LIPIDS
Cholesterol, Total: 121 mg/dL (ref <200)
HDL Particle Number: 29.8 umol/L — ABNORMAL LOW (ref >=30.5)
HDL Size: 9.3 nm (ref >=9.2)
HDL-C: 43 mg/dL (ref >=40)
LDL (calc): 62 mg/dL (ref <100)
LDL Particle Number: 1065 nmol/L — ABNORMAL HIGH (ref <1000)
LDL Size: 20.6 nm (ref >20.5)
LP-IR Score: 44 (ref <=45)
Large HDL-P: 6.5 umol/L (ref >=4.8)
Large VLDL-P: 2 nmol/L (ref <=2.7)
Small LDL Particle Number: 677 nmol/L — ABNORMAL HIGH (ref <=527)
Triglycerides: 81 mg/dL (ref <150)
VLDL Size: 46.2 nm (ref <=46.6)

## 2013-11-23 ENCOUNTER — Other Ambulatory Visit: Payer: Self-pay | Admitting: *Deleted

## 2013-11-23 DIAGNOSIS — I2510 Atherosclerotic heart disease of native coronary artery without angina pectoris: Secondary | ICD-10-CM

## 2013-11-23 DIAGNOSIS — E785 Hyperlipidemia, unspecified: Secondary | ICD-10-CM

## 2013-11-23 MED ORDER — ATORVASTATIN CALCIUM 20 MG PO TABS: 20.0000 mg | ORAL_TABLET | Freq: Every day | ORAL | Status: DC

## 2013-12-13 DIAGNOSIS — R062 Wheezing: Secondary | ICD-10-CM | POA: Diagnosis not present

## 2013-12-13 DIAGNOSIS — J449 Chronic obstructive pulmonary disease, unspecified: Secondary | ICD-10-CM | POA: Diagnosis not present

## 2013-12-13 DIAGNOSIS — J209 Acute bronchitis, unspecified: Secondary | ICD-10-CM | POA: Diagnosis not present

## 2013-12-13 DIAGNOSIS — R0602 Shortness of breath: Secondary | ICD-10-CM | POA: Diagnosis not present

## 2013-12-13 DIAGNOSIS — R0902 Hypoxemia: Secondary | ICD-10-CM | POA: Diagnosis not present

## 2013-12-13 DIAGNOSIS — J441 Chronic obstructive pulmonary disease with (acute) exacerbation: Secondary | ICD-10-CM | POA: Diagnosis not present

## 2013-12-27 DIAGNOSIS — E785 Hyperlipidemia, unspecified: Secondary | ICD-10-CM | POA: Diagnosis not present

## 2013-12-27 DIAGNOSIS — G894 Chronic pain syndrome: Secondary | ICD-10-CM | POA: Diagnosis not present

## 2013-12-27 DIAGNOSIS — I10 Essential (primary) hypertension: Secondary | ICD-10-CM | POA: Diagnosis not present

## 2013-12-27 DIAGNOSIS — I509 Heart failure, unspecified: Secondary | ICD-10-CM | POA: Diagnosis not present

## 2013-12-27 DIAGNOSIS — I2510 Atherosclerotic heart disease of native coronary artery without angina pectoris: Secondary | ICD-10-CM | POA: Diagnosis not present

## 2013-12-27 DIAGNOSIS — Z79899 Other long term (current) drug therapy: Secondary | ICD-10-CM | POA: Diagnosis not present

## 2013-12-28 DIAGNOSIS — E785 Hyperlipidemia, unspecified: Secondary | ICD-10-CM | POA: Diagnosis not present

## 2013-12-28 DIAGNOSIS — E559 Vitamin D deficiency, unspecified: Secondary | ICD-10-CM | POA: Diagnosis not present

## 2013-12-28 DIAGNOSIS — I10 Essential (primary) hypertension: Secondary | ICD-10-CM | POA: Diagnosis not present

## 2014-01-20 DIAGNOSIS — Z13820 Encounter for screening for osteoporosis: Secondary | ICD-10-CM | POA: Diagnosis not present

## 2014-01-20 DIAGNOSIS — M899 Disorder of bone, unspecified: Secondary | ICD-10-CM | POA: Diagnosis not present

## 2014-01-20 DIAGNOSIS — M949 Disorder of cartilage, unspecified: Secondary | ICD-10-CM | POA: Diagnosis not present

## 2014-01-25 DIAGNOSIS — T8549XA Other mechanical complication of breast prosthesis and implant, initial encounter: Secondary | ICD-10-CM | POA: Diagnosis not present

## 2014-01-25 DIAGNOSIS — R928 Other abnormal and inconclusive findings on diagnostic imaging of breast: Secondary | ICD-10-CM | POA: Diagnosis not present

## 2014-01-27 DIAGNOSIS — E876 Hypokalemia: Secondary | ICD-10-CM | POA: Diagnosis not present

## 2014-02-11 DIAGNOSIS — R079 Chest pain, unspecified: Secondary | ICD-10-CM | POA: Diagnosis not present

## 2014-02-11 DIAGNOSIS — S43429A Sprain of unspecified rotator cuff capsule, initial encounter: Secondary | ICD-10-CM | POA: Diagnosis not present

## 2014-02-11 DIAGNOSIS — J449 Chronic obstructive pulmonary disease, unspecified: Secondary | ICD-10-CM | POA: Diagnosis not present

## 2014-02-11 DIAGNOSIS — R5381 Other malaise: Secondary | ICD-10-CM | POA: Diagnosis not present

## 2014-02-11 DIAGNOSIS — Z79899 Other long term (current) drug therapy: Secondary | ICD-10-CM | POA: Diagnosis not present

## 2014-02-11 DIAGNOSIS — I509 Heart failure, unspecified: Secondary | ICD-10-CM | POA: Diagnosis not present

## 2014-02-11 DIAGNOSIS — M25519 Pain in unspecified shoulder: Secondary | ICD-10-CM | POA: Diagnosis not present

## 2014-02-11 DIAGNOSIS — M545 Low back pain, unspecified: Secondary | ICD-10-CM | POA: Diagnosis not present

## 2014-02-11 DIAGNOSIS — I10 Essential (primary) hypertension: Secondary | ICD-10-CM | POA: Diagnosis not present

## 2014-02-11 DIAGNOSIS — S46909A Unspecified injury of unspecified muscle, fascia and tendon at shoulder and upper arm level, unspecified arm, initial encounter: Secondary | ICD-10-CM | POA: Diagnosis not present

## 2014-02-11 DIAGNOSIS — R29810 Facial weakness: Secondary | ICD-10-CM | POA: Diagnosis not present

## 2014-02-11 DIAGNOSIS — R5383 Other fatigue: Secondary | ICD-10-CM | POA: Diagnosis not present

## 2014-02-11 DIAGNOSIS — E039 Hypothyroidism, unspecified: Secondary | ICD-10-CM | POA: Diagnosis not present

## 2014-02-11 DIAGNOSIS — X58XXXA Exposure to other specified factors, initial encounter: Secondary | ICD-10-CM | POA: Diagnosis not present

## 2014-02-11 DIAGNOSIS — J440 Chronic obstructive pulmonary disease with acute lower respiratory infection: Secondary | ICD-10-CM | POA: Diagnosis not present

## 2014-02-11 DIAGNOSIS — S4980XA Other specified injuries of shoulder and upper arm, unspecified arm, initial encounter: Secondary | ICD-10-CM | POA: Diagnosis not present

## 2014-02-11 DIAGNOSIS — IMO0002 Reserved for concepts with insufficient information to code with codable children: Secondary | ICD-10-CM | POA: Diagnosis not present

## 2014-02-11 DIAGNOSIS — J209 Acute bronchitis, unspecified: Secondary | ICD-10-CM | POA: Diagnosis not present

## 2014-03-06 ENCOUNTER — Other Ambulatory Visit: Payer: Self-pay | Admitting: Internal Medicine

## 2014-03-14 DIAGNOSIS — J449 Chronic obstructive pulmonary disease, unspecified: Secondary | ICD-10-CM | POA: Diagnosis not present

## 2014-03-14 DIAGNOSIS — S199XXA Unspecified injury of neck, initial encounter: Secondary | ICD-10-CM | POA: Diagnosis not present

## 2014-03-14 DIAGNOSIS — Z79899 Other long term (current) drug therapy: Secondary | ICD-10-CM | POA: Diagnosis not present

## 2014-03-14 DIAGNOSIS — S0993XA Unspecified injury of face, initial encounter: Secondary | ICD-10-CM | POA: Diagnosis not present

## 2014-03-14 DIAGNOSIS — K219 Gastro-esophageal reflux disease without esophagitis: Secondary | ICD-10-CM | POA: Diagnosis not present

## 2014-03-14 DIAGNOSIS — Z7901 Long term (current) use of anticoagulants: Secondary | ICD-10-CM | POA: Diagnosis not present

## 2014-03-14 DIAGNOSIS — E039 Hypothyroidism, unspecified: Secondary | ICD-10-CM | POA: Diagnosis not present

## 2014-03-14 DIAGNOSIS — I10 Essential (primary) hypertension: Secondary | ICD-10-CM | POA: Diagnosis not present

## 2014-03-14 DIAGNOSIS — I509 Heart failure, unspecified: Secondary | ICD-10-CM | POA: Diagnosis not present

## 2014-03-14 DIAGNOSIS — S022XXA Fracture of nasal bones, initial encounter for closed fracture: Secondary | ICD-10-CM | POA: Diagnosis not present

## 2014-03-14 DIAGNOSIS — S0120XA Unspecified open wound of nose, initial encounter: Secondary | ICD-10-CM | POA: Diagnosis not present

## 2014-03-14 DIAGNOSIS — S0990XA Unspecified injury of head, initial encounter: Secondary | ICD-10-CM | POA: Diagnosis not present

## 2014-03-14 DIAGNOSIS — R296 Repeated falls: Secondary | ICD-10-CM | POA: Diagnosis not present

## 2014-03-27 DIAGNOSIS — M899 Disorder of bone, unspecified: Secondary | ICD-10-CM | POA: Diagnosis not present

## 2014-03-27 DIAGNOSIS — F339 Major depressive disorder, recurrent, unspecified: Secondary | ICD-10-CM | POA: Diagnosis not present

## 2014-03-27 DIAGNOSIS — D649 Anemia, unspecified: Secondary | ICD-10-CM | POA: Diagnosis not present

## 2014-03-27 DIAGNOSIS — G894 Chronic pain syndrome: Secondary | ICD-10-CM | POA: Diagnosis not present

## 2014-03-27 DIAGNOSIS — M949 Disorder of cartilage, unspecified: Secondary | ICD-10-CM | POA: Diagnosis not present

## 2014-04-03 DIAGNOSIS — M899 Disorder of bone, unspecified: Secondary | ICD-10-CM | POA: Diagnosis not present

## 2014-04-03 DIAGNOSIS — M949 Disorder of cartilage, unspecified: Secondary | ICD-10-CM | POA: Diagnosis not present

## 2014-04-03 DIAGNOSIS — G894 Chronic pain syndrome: Secondary | ICD-10-CM | POA: Diagnosis not present

## 2014-04-03 DIAGNOSIS — D649 Anemia, unspecified: Secondary | ICD-10-CM | POA: Diagnosis not present

## 2014-04-26 ENCOUNTER — Emergency Department (HOSPITAL_COMMUNITY): Payer: Medicare Other

## 2014-04-26 ENCOUNTER — Emergency Department (HOSPITAL_COMMUNITY)
Admission: EM | Admit: 2014-04-26 | Discharge: 2014-04-26 | Disposition: A | Discharge status: Home / Self Care | Payer: Medicare Other | Attending: Emergency Medicine | Admitting: Emergency Medicine

## 2014-04-26 ENCOUNTER — Encounter (HOSPITAL_COMMUNITY): Payer: Self-pay | Admitting: Emergency Medicine

## 2014-04-26 DIAGNOSIS — Z880 Allergy status to penicillin: Secondary | ICD-10-CM | POA: Diagnosis not present

## 2014-04-26 DIAGNOSIS — Z86718 Personal history of other venous thrombosis and embolism: Secondary | ICD-10-CM | POA: Insufficient documentation

## 2014-04-26 DIAGNOSIS — F329 Major depressive disorder, single episode, unspecified: Secondary | ICD-10-CM | POA: Diagnosis not present

## 2014-04-26 DIAGNOSIS — Y929 Unspecified place or not applicable: Secondary | ICD-10-CM | POA: Insufficient documentation

## 2014-04-26 DIAGNOSIS — R109 Unspecified abdominal pain: Secondary | ICD-10-CM | POA: Diagnosis not present

## 2014-04-26 DIAGNOSIS — I2510 Atherosclerotic heart disease of native coronary artery without angina pectoris: Secondary | ICD-10-CM | POA: Insufficient documentation

## 2014-04-26 DIAGNOSIS — S3011XA Contusion of abdominal wall, initial encounter: Secondary | ICD-10-CM | POA: Diagnosis not present

## 2014-04-26 DIAGNOSIS — F3289 Other specified depressive episodes: Secondary | ICD-10-CM | POA: Insufficient documentation

## 2014-04-26 DIAGNOSIS — E669 Obesity, unspecified: Secondary | ICD-10-CM | POA: Diagnosis not present

## 2014-04-26 DIAGNOSIS — S301XXA Contusion of abdominal wall, initial encounter: Secondary | ICD-10-CM | POA: Insufficient documentation

## 2014-04-26 DIAGNOSIS — I739 Peripheral vascular disease, unspecified: Secondary | ICD-10-CM | POA: Diagnosis not present

## 2014-04-26 DIAGNOSIS — G4730 Sleep apnea, unspecified: Secondary | ICD-10-CM | POA: Insufficient documentation

## 2014-04-26 DIAGNOSIS — I209 Angina pectoris, unspecified: Secondary | ICD-10-CM | POA: Diagnosis not present

## 2014-04-26 DIAGNOSIS — M25559 Pain in unspecified hip: Secondary | ICD-10-CM | POA: Diagnosis not present

## 2014-04-26 DIAGNOSIS — Z87891 Personal history of nicotine dependence: Secondary | ICD-10-CM | POA: Diagnosis not present

## 2014-04-26 DIAGNOSIS — I5032 Chronic diastolic (congestive) heart failure: Secondary | ICD-10-CM | POA: Diagnosis not present

## 2014-04-26 DIAGNOSIS — Z951 Presence of aortocoronary bypass graft: Secondary | ICD-10-CM | POA: Diagnosis not present

## 2014-04-26 DIAGNOSIS — E0590 Thyrotoxicosis, unspecified without thyrotoxic crisis or storm: Secondary | ICD-10-CM | POA: Diagnosis not present

## 2014-04-26 DIAGNOSIS — I10 Essential (primary) hypertension: Secondary | ICD-10-CM | POA: Insufficient documentation

## 2014-04-26 DIAGNOSIS — W1809XA Striking against other object with subsequent fall, initial encounter: Secondary | ICD-10-CM | POA: Insufficient documentation

## 2014-04-26 DIAGNOSIS — Z862 Personal history of diseases of the blood and blood-forming organs and certain disorders involving the immune mechanism: Secondary | ICD-10-CM | POA: Diagnosis not present

## 2014-04-26 DIAGNOSIS — S298XXA Other specified injuries of thorax, initial encounter: Secondary | ICD-10-CM | POA: Diagnosis not present

## 2014-04-26 DIAGNOSIS — E785 Hyperlipidemia, unspecified: Secondary | ICD-10-CM | POA: Diagnosis not present

## 2014-04-26 DIAGNOSIS — S20229A Contusion of unspecified back wall of thorax, initial encounter: Secondary | ICD-10-CM | POA: Diagnosis not present

## 2014-04-26 DIAGNOSIS — Z6839 Body mass index (BMI) 39.0-39.9, adult: Secondary | ICD-10-CM | POA: Diagnosis not present

## 2014-04-26 DIAGNOSIS — Z79899 Other long term (current) drug therapy: Secondary | ICD-10-CM | POA: Insufficient documentation

## 2014-04-26 DIAGNOSIS — Z9861 Coronary angioplasty status: Secondary | ICD-10-CM | POA: Diagnosis not present

## 2014-04-26 DIAGNOSIS — Y9389 Activity, other specified: Secondary | ICD-10-CM | POA: Insufficient documentation

## 2014-04-26 DIAGNOSIS — J449 Chronic obstructive pulmonary disease, unspecified: Secondary | ICD-10-CM | POA: Diagnosis not present

## 2014-04-26 DIAGNOSIS — M1990 Unspecified osteoarthritis, unspecified site: Secondary | ICD-10-CM | POA: Insufficient documentation

## 2014-04-26 DIAGNOSIS — S79919A Unspecified injury of unspecified hip, initial encounter: Secondary | ICD-10-CM | POA: Diagnosis not present

## 2014-04-26 DIAGNOSIS — K219 Gastro-esophageal reflux disease without esophagitis: Secondary | ICD-10-CM | POA: Diagnosis not present

## 2014-04-26 DIAGNOSIS — Z7901 Long term (current) use of anticoagulants: Secondary | ICD-10-CM | POA: Diagnosis not present

## 2014-04-26 DIAGNOSIS — S3981XA Other specified injuries of abdomen, initial encounter: Secondary | ICD-10-CM | POA: Diagnosis not present

## 2014-04-26 DIAGNOSIS — J4489 Other specified chronic obstructive pulmonary disease: Secondary | ICD-10-CM | POA: Insufficient documentation

## 2014-04-26 LAB — CBC WITH DIFFERENTIAL/PLATELET
Basophils Absolute: 0.1 10*3/uL (ref 0.0–0.1)
Basophils Relative: 1 % (ref 0–1)
Eosinophils Absolute: 0.2 10*3/uL (ref 0.0–0.7)
Eosinophils Relative: 2 % (ref 0–5)
HCT: 35.7 % — ABNORMAL LOW (ref 36.0–46.0)
Hemoglobin: 11.1 g/dL — ABNORMAL LOW (ref 12.0–15.0)
Lymphocytes Relative: 23 % (ref 12–46)
Lymphs Abs: 1.9 10*3/uL (ref 0.7–4.0)
MCH: 26.7 pg (ref 26.0–34.0)
MCHC: 31.1 g/dL (ref 30.0–36.0)
MCV: 85.8 fL (ref 78.0–100.0)
Monocytes Absolute: 0.8 10*3/uL (ref 0.1–1.0)
Monocytes Relative: 9 % (ref 3–12)
Neutro Abs: 5.3 10*3/uL (ref 1.7–7.7)
Neutrophils Relative %: 65 % (ref 43–77)
Platelets: 257 10*3/uL (ref 150–400)
RBC: 4.16 MIL/uL (ref 3.87–5.11)
RDW: 17.3 % — ABNORMAL HIGH (ref 11.5–15.5)
WBC: 8.1 10*3/uL (ref 4.0–10.5)

## 2014-04-26 LAB — PROTIME-INR
INR: 1.45 (ref 0.00–1.49)
Prothrombin Time: 17.3 seconds — ABNORMAL HIGH (ref 11.6–15.2)

## 2014-04-26 LAB — BASIC METABOLIC PANEL
BUN: 14 mg/dL (ref 6–23)
CO2: 32 mEq/L (ref 19–32)
Calcium: 9 mg/dL (ref 8.4–10.5)
Chloride: 106 mEq/L (ref 96–112)
Creatinine, Ser: 0.74 mg/dL (ref 0.50–1.10)
GFR calc Af Amer: >90 mL/min (ref >90)
GFR calc non Af Amer: 82 mL/min — ABNORMAL LOW (ref >90)
Glucose, Bld: 122 mg/dL — ABNORMAL HIGH (ref 70–99)
Potassium: 4.3 mEq/L (ref 3.7–5.3)
Sodium: 144 mEq/L (ref 137–147)

## 2014-04-26 LAB — APTT: aPTT: 36 seconds (ref 24–37)

## 2014-04-26 MED ORDER — MORPHINE SULFATE 2 MG/ML IJ SOLN
2.0000 mg | INTRAMUSCULAR | Status: AC | PRN
  Administered 2014-04-26 (×2): 2 mg via INTRAVENOUS
  Filled 2014-04-26 (×2): qty 1

## 2014-04-26 MED ORDER — SODIUM CHLORIDE 0.9 % IV SOLN
INTRAVENOUS | Status: DC
  Administered 2014-04-26: 13:00:00 via INTRAVENOUS

## 2014-04-26 MED ORDER — IOHEXOL 300 MG/ML  SOLN: 25.0000 mL | INTRAMUSCULAR | Status: AC

## 2014-04-26 MED ORDER — IOHEXOL 300 MG/ML  SOLN
80.0000 mL | Freq: Once | INTRAMUSCULAR | Status: AC | PRN
  Administered 2014-04-26: 80 mL via INTRAVENOUS

## 2014-04-26 NOTE — ED Notes (Signed)
73 yo female via [facility] with c/o leg pain from a fall sustained on Sunday. Pt is abmulatory but with pain. Per EMS no shortening, no rotation, positive pulses. Bruised from Right Flank down to gluteal folds.  HX of CABG currently on Plavix.

## 2014-04-26 NOTE — ED Provider Notes (Signed)
CSN: [id number]     Arrival date & time 04/26/14  1138 History   First MD Initiated Contact with Patient 04/26/14 1140     Chief Complaint  Patient presents with  . Fall    From fall on Sunday  . Flank Pain  . Hip Pain      HPI Pt was seen at 1220. Per pt and her family, c/o sudden onset and resolution of one episode of slip and fall on Sunday (3 days ago). Pt states she went to sit down in a wheelchair when "it rolled out from under me" and she fell to the floor. Pt states she hit her right flank against furniture. Pt was ambulatory after and since the fall. Pt c/o ongoing pain in her right lower back and right hip areas. Denies hitting head, no LOC, no AMS since fall, no neck pain, no CP/SOB, no abd pain, no N/V/D, no focal motor weakness, no tingling/numbness in extremities, no saddle anesthesia, no incont/retention of bowel/bladder.     Past Medical History  Diagnosis Date  . Hypertension   . PVD (peripheral vascular disease)   . COPD (chronic obstructive pulmonary disease)     Emphysema dxed by Dr. Woody Seller in [place] based on PFTs per pt in 2006; placed on albuterol  . HLD (hyperlipidemia)   . Osteoarthritis   . DVT of lower extremity (deep venous thrombosis)     recurrent. bilateral (2 episodes)  . Arthralgia     NOS  . Obesity (BMI 30-39.9) 2009    BMI 33  . Hyperthyroidism   . CAD (coronary artery disease)     s/p CABG 2004; s/p DES to LM in 2010;  [place] 10/29/11: EF 50-55%, mild elevated filling pressures, no pulmonary HTN, LM 90% ISR, LAD and CFX occluded, S-RI occluded (old), S-OM3 ok and L-LAD ok, native nondominant RCA 95% -  med rx recommended ; Lexiscan Myoview 7/13 at [facility]: demonstrated "normal LV function, anterior attenuation and localized ischemia, inferior, basilar, mid section"  . Carotid artery occlusion     moderate  . Exertional angina     Treated with Isosorbide, Ranexa, amlodipine; intolerant to metoprolol  . Chronic diastolic heart failure     Echo  9/10: EF 34-19%, grade 1 diastolic dysfunction  . Blood transfusion 2011    WITH BACK SURGERY  . GERD (gastroesophageal reflux disease)   . Depression     TAKES CELEXA  AND  (OFF- WELBUTRIN)  . Sleep apnea 2012    USED CPAP THEN  STARTED USING SPIRIVA , Nov. 2013- last evaluation , changed from mask to aparatus that is just for her nose..   . Anemia     bld. transfusion post lumbar surgery- 2012   Past Surgical History  Procedure Laterality Date  . Ankle surgery  2004  . Groin mass open biopsy  2004  . Neck surgery  12/08    Due for repeat neck surgery  . Breast enhancement surgery    . Total hip arthroplasty  2007    Right  . Coronary artery bypass graft  2004  . Cardiac catheterization  11/2009    Patent LIMA to LAD and patent SVG to OM1. Occluded SVG to ramus and diagonal. Left main: 90% ostial stenosis, LCX 60-70% proximal stenosis  . Coronary angioplasty with stent placement  11/2009    Drug eluting stent to left main artery: 4.0 X 12 mm Ion   . Joint replacement  2012     BILATERAL  KNEES  . Joint replacement  2010     RIGHT HIP REPLACEMENT  . Reverse shoulder arthroplasty  02/26/2012    Procedure: REVERSE SHOULDER ARTHROPLASTY;  Surgeon: Marin Shutter, MD;  Location: [facility];  Service: Orthopedics;  Laterality: Right;  RIGHT SHOULDER REVERSED ARTHROPLASTY  . Eye surgery  2006    BILATERAL CAT EXT.   . Back surgery  1986    lower; another scheduled, opt. reports 4 back- lumbar, 3 cerv. fusions  for later 2009  . Shoulder arthroscopy with subacromial decompression Left 02/10/2013    Procedure: SHOULDER ARTHROSCOPY WITH SUBACROMIAL DECOMPRESSION DISTAL CLAVICLE RESECTION;  Surgeon: Marin Shutter, MD;  Location: [facility];  Service: Orthopedics;  Laterality: Left;  DISTAL CLAVICLE RESECTION   Family History  Problem Relation Age of Onset  . Cancer    . Heart disease    . Colitis    . Cancer Mother     Ovarian  . Cancer Father     Lung   History  Substance Use Topics  .  Smoking status: Former Smoker -- 69 years    Quit date: 12/22/2002  . Smokeless tobacco: Never Used  . Alcohol Use: No    Review of Systems ROS: Statement: All systems negative except as marked or noted in the HPI; Constitutional: Negative for fever and chills. ; ; Eyes: Negative for eye pain, redness and discharge. ; ; ENMT: Negative for ear pain, hoarseness, nasal congestion, sinus pressure and sore throat. ; ; Cardiovascular: Negative for chest pain, palpitations, diaphoresis, dyspnea and peripheral edema. ; ; Respiratory: Negative for cough, wheezing and stridor. ; ; Gastrointestinal: Negative for nausea, vomiting, diarrhea, abdominal pain, blood in stool, hematemesis, jaundice and rectal bleeding. ; ; Genitourinary: Negative for dysuria and hematuria. ; ; Musculoskeletal: +LBP. Negative for back pain and neck pain. Negative for swelling and deformity.; ; Skin: +ecchymosis. Negative for pruritus, rash, abrasions, blisters, and skin lesion.; ; Neuro: Negative for headache, lightheadedness and neck stiffness. Negative for weakness, altered level of consciousness , altered mental status, extremity weakness, paresthesias, involuntary movement, seizure and syncope.      Allergies  Zolpidem tartrate and Penicillins  Home Medications   Prior to Admission medications   Medication Sig Start Date End Date Taking? Authorizing Provider  ALPRAZolam Duanne Moron) 0.5 MG tablet Take 0.5 mg by mouth at bedtime.    Yes Historical Provider, MD  atorvastatin (LIPITOR) 20 MG tablet Take 20 mg by mouth daily.   Yes Historical Provider, MD  citalopram (CELEXA) 20 MG tablet Take 60 mg by mouth daily. Takes one tablet TID   Yes Historical Provider, MD  cyclobenzaprine (FLEXERIL) 10 MG tablet Take 10 mg by mouth 3 (three) times daily as needed.    Yes Historical Provider, MD  ergocalciferol (VITAMIN D2) 50000 UNITS capsule Take 50,000 Units by mouth every Thursday.    Yes Historical Provider, MD  furosemide (LASIX) 80  MG tablet Take 80 mg by mouth 2 (two) times daily.    Yes Historical Provider, MD  isosorbide mononitrate (IMDUR) 60 MG 24 hr tablet Take 60 mg by mouth at bedtime.   Yes Historical Provider, MD  levothyroxine (SYNTHROID, LEVOTHROID) 137 MCG tablet Take 137 mcg by mouth daily before breakfast.    Yes Historical Provider, MD  lisinopril (PRINIVIL,ZESTRIL) 20 MG tablet Take 20-40 mg by mouth daily. Pt. Reports that she takes her BP & if its elevated she takes 40 mg. Instead of 20mg .   Yes Historical Provider, MD  metoprolol  tartrate (LOPRESSOR) 25 MG tablet Take 25 mg by mouth 2 (two) times daily.   Yes Historical Provider, MD  nitroGLYCERIN (NITROSTAT) 0.4 MG SL tablet Place 0.4 mg under the tongue every 5 (five) minutes as needed for chest pain. For chest pain   Yes Historical Provider, MD  omeprazole (PRILOSEC) 40 MG capsule Take 40 mg by mouth 2 (two) times daily.    Yes Historical Provider, MD  oxyCODONE-acetaminophen (PERCOCET) 10-325 MG per tablet Take 1 tablet by mouth every 6 (six) hours as needed for pain.    Yes Historical Provider, MD  pilocarpine (PILOCAR) 4 % ophthalmic solution Place 1 drop into both eyes 2 (two) times daily.   Yes Historical Provider, MD  potassium chloride (KLOR-CON) 20 MEQ packet Take 20 mEq by mouth daily.   Yes Historical Provider, MD  pramipexole (MIRAPEX) 0.25 MG tablet Take 0.25 mg by mouth at bedtime.    Yes Historical Provider, MD  Rivaroxaban (XARELTO) 20 MG TABS Take 20 mg by mouth daily with supper.    Yes Historical Provider, MD  roflumilast (DALIRESP) 500 MCG TABS tablet Take 500 mcg by mouth at bedtime.    Yes Historical Provider, MD  tiotropium (SPIRIVA) 18 MCG inhalation capsule Place 18 mcg into inhaler and inhale daily.   Yes Historical Provider, MD  traZODone (DESYREL) 50 MG tablet Take 150 mg by mouth at bedtime.    Yes Historical Provider, MD  VITAMIN D, ERGOCALCIFEROL, PO Take 1 tablet by mouth daily.   Yes Historical Provider, MD   BP 127/57   Temp(Src) 98.1 F (36.7 C) (Oral)  Resp 17  SpO2 97% Physical Exam 1225: Physical examination:  Nursing notes reviewed; Vital signs and O2 SAT reviewed;  Constitutional: Well developed, Well nourished, Well hydrated, In no acute distress; Head:  Normocephalic, atraumatic; Eyes: EOMI, PERRL, No scleral icterus; ENMT: Mouth and pharynx normal, Mucous membranes moist; Neck: Supple, Full range of motion, No lymphadenopathy; Cardiovascular: Regular rate and rhythm, No gallop; Respiratory: Breath sounds clear & equal bilaterally, No rales, rhonchi, wheezes.  Speaking full sentences with ease, Normal respiratory effort/excursion; Chest: Nontender, Movement normal; Abdomen: Soft, Nontender, Nondistended, Normal bowel sounds; Genitourinary: No CVA tenderness; Spine:  No midline CS, TS, LS tenderness. +TTP right lumbar paraspinal muscles with large ecchymosis extending to right buttock.;; Extremities: Pulses normal, NT right hip/knee/ankle/foot. No deformity. Pelvis stable. No edema, No calf edema or asymmetry.; Neuro: AA&Ox3, Major CN grossly intact.  Speech clear. No gross focal motor or sensory deficits in extremities. ; Skin: Color normal, Warm, Dry.   ED Course  Procedures     EKG Interpretation None      MDM  MDM Reviewed: previous chart, nursing note and vitals Reviewed previous: labs Interpretation: labs, x-ray and CT scan    Results for orders placed during the hospital encounter of 04/26/14  CBC WITH DIFFERENTIAL      Result Value Ref Range   WBC 8.1  4.0 - 10.5 K/uL   RBC 4.16  3.87 - 5.11 MIL/uL   Hemoglobin 11.1 (*) 12.0 - 15.0 g/dL   HCT 35.7 (*) 36.0 - 46.0 %   MCV 85.8  78.0 - 100.0 fL   MCH 26.7  26.0 - 34.0 pg   MCHC 31.1  30.0 - 36.0 g/dL   RDW 17.3 (*) 11.5 - 15.5 %   Platelets 257  150 - 400 K/uL   Neutrophils Relative % 65  43 - 77 %   Neutro Abs 5.3  1.7 - 7.7  K/uL   Lymphocytes Relative 23  12 - 46 %   Lymphs Abs 1.9  0.7 - 4.0 K/uL   Monocytes Relative 9  3 -  12 %   Monocytes Absolute 0.8  0.1 - 1.0 K/uL   Eosinophils Relative 2  0 - 5 %   Eosinophils Absolute 0.2  0.0 - 0.7 K/uL   Basophils Relative 1  0 - 1 %   Basophils Absolute 0.1  0.0 - 0.1 K/uL  BASIC METABOLIC PANEL      Result Value Ref Range   Sodium 144  137 - 147 mEq/L   Potassium 4.3  3.7 - 5.3 mEq/L   Chloride 106  96 - 112 mEq/L   CO2 32  19 - 32 mEq/L   Glucose, Bld 122 (*) 70 - 99 mg/dL   BUN 14  6 - 23 mg/dL   Creatinine, Ser 0.74  0.50 - 1.10 mg/dL   Calcium 9.0  8.4 - 10.5 mg/dL   GFR calc non Af Amer 82 (*) >90 mL/min   GFR calc Af Amer >90  >90 mL/min  PROTIME-INR      Result Value Ref Range   Prothrombin Time 17.3 (*) 11.6 - 15.2 seconds   INR 1.45  0.00 - 1.49  APTT      Result Value Ref Range   aPTT 36  24 - 37 seconds   Dg Chest 2 View 04/26/2014   CLINICAL DATA:  Fall.  EXAM: CHEST - 2 VIEW  COMPARISON:  02/11/2014  FINDINGS: Chronic lung disease is stable. Heart size is normal status post prior CABG. There is no evidence of pulmonary edema, consolidation, pneumothorax, nodule or pleural fluid. Stable degenerative changes of the thoracic spine.  IMPRESSION: Stable chronic lung disease.  No active disease.   Electronically Signed   By: Aletta Edouard M.D.   On: 04/26/2014 14:18   Dg Hip Complete Right 04/26/2014   CLINICAL DATA:  RIGHT side pain, fell 3 days ago  EXAM: RIGHT HIP - COMPLETE 2+ VIEW  COMPARISON:  RIGHT hip radiographs 10/03/2009  FINDINGS: Components of RIGHT hip prosthesis in expected positions.  Diffuse osseous demineralization.  Osteoarthritic changes LEFT hip.  Interval removal of orthopedic hardware at L5.  SI joints symmetric.  Mild lucency surrounding the tip of the femoral component, increased since 2010.  No definite acute fracture, dislocation, or bone destruction.  IMPRESSION: No acute osseous abnormalities.  RIGHT hip prosthesis with slightly increased lucency surrounding the tip of the femoral component, cannot exclude loosening.  Osseous  demineralization with osteoarthritic changes LEFT hip.   Electronically Signed   By: Lavonia Dana M.D.   On: 04/26/2014 14:25   Ct Abdomen Pelvis W Contrast 04/26/2014   CLINICAL DATA:  73 year old female status post fall with flank and right hip pain. On Plavix. Initial encounter.  EXAM: CT ABDOMEN AND PELVIS WITH CONTRAST  TECHNIQUE: Multidetector CT imaging of the abdomen and pelvis was performed using the standard protocol following bolus administration of intravenous contrast.  CONTRAST:  54mL OMNIPAQUE IOHEXOL 300 MG/ML  SOLN  COMPARISON:  Lumbar radiographs 02/11/2014. CT Abdomen and Pelvis 03/30/2011.  FINDINGS: Partially visible calcified left breast implant. Negative lung bases. No pericardial or pleural effusion. Widespread Aortoiliac calcified atherosclerosis noted. With ectatic descending thoracic aorta. Major arterial structures in the abdomen and pelvis are patent.  Chronic right hip arthroplasty. Streak artifact degrades detail of the pelvis. There is a right flank superficial soft tissue contusion (series 2, image 57), with  scattered nodularity in stranding (series 2, images 35- 57). The underlying right iliac wing appears intact. Remaining bony pelvis appears intact. Grossly stable proximal right femur.  Chronic postoperative changes to the lumbar spine with fusion and decompression.  No pelvic free fluid. Stable uterus and adnexa. Nondilated distal large and small bowel in the pelvis. Grossly negative bladder.  Sigmoid diverticulosis. No active inflammation identified. Left colon diverticulosis. Redundant splenic flexure and transverse colon. Diverticulosis of the proximal transverse colon. Negative right colon. No dilated small bowel. Stomach and duodenum within normal limits.  Decompressed gallbladder today. Liver and biliary tree appear stable. Spleen, pancreas (atrophied) and adrenal glands are stable. Kidneys are stable. No abdominal free fluid. Portal venous system within normal limits.   IMPRESSION: 1. Mild right flank superficial soft tissue injury. No underlying osseous injury identified. No abdominal or pelvic visceral injury. 2. Chronic findings including aortoiliac calcified atherosclerosis, and diverticulosis of the colon.   Electronically Signed   By: Lars Pinks M.D.   On: 04/26/2014 14:44     1530:  Workup reassuring. Pt feels better after pain meds and wants to go home now. Pt states she is due to receive her monthly rx for percocet in 2 days and she does not want a rx for pain meds from me at this time. Dx and testing d/w pt and family.  Questions answered.  Verb understanding, agreeable to d/c home with outpt f/u.     Alfonzo Feller, DO 04/29/14 1355

## 2014-04-26 NOTE — Discharge Instructions (Signed)
°Emergency Department Resource Guide °1) Find a Doctor and Pay Out of Pocket °Although you won't have to find out who is covered by your insurance plan, it is a good idea to ask around and get recommendations. You will then need to call the office and see if the doctor you have chosen will accept you as a new patient and what types of options they offer for patients who are self-pay. Some doctors offer discounts or will set up payment plans for their patients who do not have insurance, but you will need to ask so you aren't surprised when you get to your appointment. ° °2) Contact Your Local Health Department °Not all health departments have doctors that can see patients for sick visits, but many do, so it is worth a call to see if yours does. If you don't know where your local health department is, you can check in your phone book. The CDC also has a tool to help you locate your state's health department, and many state websites also have listings of all of their local health departments. ° °3) Find a Walk-in Clinic °If your illness is not likely to be very severe or complicated, you may want to try a walk in clinic. These are popping up all over the country in pharmacies, drugstores, and shopping centers. They're usually staffed by nurse practitioners or physician assistants that have been trained to treat common illnesses and complaints. They're usually fairly quick and inexpensive. However, if you have serious medical issues or chronic medical problems, these are probably not your best option. ° °No Primary Care Doctor: °- Call [facility] at  832-8000 - they can help you locate a primary care doctor that  accepts your insurance, provides certain services, etc. °- Physician Referral Service- 1-800-533-3463 ° °Chronic Pain Problems: °Organization         Address  Phone   Notes  °[facility]  (336) 297-2271 Patients need to be referred by their primary care doctor.  ° °Medication  Assistance: °Organization         Address  Phone   Notes  °[place] [facility] [address] °[place], [place] [address] (336) 641-8030 --Must be a resident of [place] °-- Must have NO insurance coverage whatsoever (no Medicaid/ Medicare, etc.) °-- The pt. MUST have a primary care doctor that directs their care regularly and follows them in the community °  °[facility]  (866) 331-1348   °[employer]  (888) 892-1162   ° °Agencies that provide inexpensive medical care: °Organization         Address  Phone   Notes  °[facility]  (336) 832-8035   °[facility]    (336) 832-7272   °[facility] [address] °[place], [place] [address] (336) 832-4777   °[facility] [address], °[place] (336) 271-4999   °[facility]    (336) 373-0678   °[facility]    (336) 272-1050   °[facility] ° [address], [place] Phone:  (336) 832-4444, Fax:  (336) 832-4440 Hours of Operation:  9 am - 6 pm, M-F.  Also accepts Medicaid/Medicare and self-pay.  °[facility] ° [address], [place] Phone: (336) 832-3150, Fax: (336) 832-3151. Hours of Operation:  8:30 am - 5:30 pm, M-F.  Also accepts Medicaid and self-pay.  °[facility] [place] [address], [place] Phone: (336) 878-6027   °[facility] [address], [place], [place] (336)723-1848, Ext. 123 Mondays & Thursdays: 7-9 AM.  First 15 patients are seen on a first come, first serve basis. °  ° °Medicaid-accepting [place] Providers: ° °Organization         Address  Phone   Notes  °[facility] [address], [place] (336) 641-2100 Also accepts self-pay patients.  °[facility] [address], [place] ° (336) 856-9996   °[facility] [address], [place]  (336) 288-8857   °[facility] [address] [place] [address], [place] (336) 299-7000   °Veita Bland [address], [place]  ° (336) 373-1557 Only accepts [place] Access Medicaid patients after they have their name applied to their card.  ° °Self-Pay (no insurance) in [place]: ° °Organization         Address  Phone   Notes  °Sickle Cell Patients, [facility] [address], [place] (336) 832-1970   °[facility] [address], [place] (336) 832-4400   °[facility] ° [address], [place] (336) 992-4800   °[facility]/Dr. Osei-Bonsu ° [address] [place] [address], [place] or [address], [place] (336) [phone] Phone number for both [place] and [place] locations is the same.  °[facility] [address], [place] (336) 299-0000   °[facility] [address] [place] [address], [place] or [address] (336) 852-7530 °(336) 878-2260   °[facility] [address], [place] (336) 350-1642, phone; (336) 294-5005, fax Sees patients 1st and 3rd Saturday of every month.  Must not qualify for public or private insurance (i.e. Medicaid, Medicare, [place] Health Choice, Veterans' Benefits) • Household income should be no more than 200% of the poverty level •The clinic cannot treat you if you are pregnant or think you are pregnant • Sexually transmitted diseases are not treated at the clinic.  ° ° °Dental Care: °Organization         Address  Phone  Notes  °[place] [facility] [address], [place] (336) 641-6152 Accepts children up to age 21 who are enrolled in Medicaid or [place] Health Choice; pregnant women with a Medicaid card; and children who have applied for Medicaid or [place] Health Choice, but were declined, whose parents can pay a reduced fee at time of service.  °[place]  [facility] [place]  [address], [place] (336) 641-7733 Accepts children up to age 21 who are enrolled in Medicaid or [place] Health Choice; pregnant women with a Medicaid card; and children who have applied for Medicaid or  Health Choice, but were declined, whose parents can pay a reduced fee at time of service.  °[facility] ° [address],  (336) 641-4533 Patients are seen by appointment only. Walk-ins are not accepted. [facility] will see patients 18 years of age and older. °Monday - Tuesday (8am-5pm) °Most Wednesdays (8:30-5pm) °$30 per visit, cash only  °[facility] ° [address], [place] (336) 641-4533 Patients are seen by appointment only. Walk-ins are not accepted. [facility] will see patients 18 years of age and older. °One   Wednesday Evening (Monthly: Volunteer Based).  $30 per visit, cash only  °[facility]  (919) [phone] for adults; Children under age 4, call Graduate Pediatric Dentistry at ([phone]) [phone]. Children aged 4-14, please call ([phone]) [phone] to request a pediatric application. ° Dental services are provided in all areas of dental care including fillings, crowns and bridges, complete and partial dentures, implants, gum treatment, root canals, and extractions. Preventive care is also provided. Treatment is provided to both adults and children. °Patients are selected via a lottery and there is often a waiting list. °  °[facility] [address], °[place] ° (336) 763-8833 [email or web address].[email or web address] °  °[facility] [address], [place], [place] (336)723-1848, Ext. 123 Second and Fourth Thursday of each month, opens at 6:30 AM; Clinic ends at 9 AM.  Patients are seen on a first-come first-served basis, and a limited number are seen during each clinic.  ° °[facility] ° [address], [place], [place] (336) [phone]    Eligibility Requirements °You must have lived in [place], [place], or [place] counties for at least the last three months. °  You cannot be eligible for state or federal sponsored healthcare insurance, including Veterans Administration, Medicaid, or Medicare. °  You generally cannot be eligible for healthcare insurance through your employer.  °  How to apply: °Eligibility screenings are held every Tuesday and Wednesday afternoon from 1:00 pm until 4:00 pm. You do not need an appointment for the interview!  °[facility] [address], [place], [place] 336-631-2330   °[facility]  336-342-8273   °[facility]  336-703-3100   °[facility]  336-570-6415   ° °Behavioral Health Resources in the Community: °Intensive Outpatient Programs °Organization         Address  Phone  Notes  °[facility] [address], [place], [place] 336-878-6098   °[facility] [address], [place], [place] 336-832-9800   °[facility] [address], [place], [place] ° 336-882-2125   °[facility] [address],  °[place], [place] 1-800-853-5163 or 336-641-4981   °Substance Abuse Resources °Organization         Address  Phone  Notes  °[facility]  336-882-2125   °[facility]  336-784-9470   °[facility]  336-285-9073   °[facility]  336-845-3988   °Residential & Outpatient Substance Abuse Program  1-800-659-3381   °Psychological Services °Organization         Address  Phone  Notes  °[facility]  336- 832-9600   °[facility]  336- 378-7881   °[facility] [address], [place] 1-800-853-5163 or 336-641-4981   ° °Mobile Crisis Teams °Organization         Address  Phone  Notes  °[facility], Mobile Crisis Care Unit  1-877-626-1772   °Assertive °[facility] ° [address]  [place], [place] 336-834-9664   °Sharon DeEsch [address] °[place] 336-554-5454   ° °Self-Help/Support Groups °Organization         Address  Phone             Notes  °[facility] [place] - variety of support groups  336- 373-1402 Call for more information  °Narcotics Anonymous (NA), [facility] [address], °[place]  2 meetings at this location  ° °  Residential Treatment Programs Organization         Address  Phone  Notes  [facility] [address]    [place]  1-(256)120-6240   [facility]  [address] [place] [id number], [place], [place]   [place], [place] [phone] Admissions: 8am-3pm M-F  [facility] Substance [place] [facility] [address]    [place], [place] [phone]   [facility] [address] [place], [place], [place]   [facility] [address],  [place], [place]   [facility] - Intensive Outpatient [address], [place], [place]   [facility] [address]  [place], [place] 1-(401) 661-3618 or (319) 828-6712   [facility] [address], [place], [place] Accepts Medicaid  [facility] [address],  [place] 1-614-854-9221 Substance Abuse/Addiction Treatment   [facility]         Address  Phone  Notes  [facility]  (973) 627-2266   Domenic Schwab, PhD [address] [place] [place], [place]   3152479652 or [phone]   [facility] [place], [place] (270) [phone]   [facility] [address], [place], [place] 409-231-8911 Insurance/Medicaid/sponsorship through [facility] [address]                                    [place], [place] ([phone]) [phone] [place] [facility] [address], [place] ([phone]    Dr. Adele Schilder  ([phone]) [phone]   [facility] Dept. 1) [address] [place] 2) [place] 3)  [address], [place] [phone]  (909)[phone]   [place] (978) 228-3048 or ([phone])[phone] (After Hours)      Take your usual prescriptions as previously directed.  Apply moist heat or ice to the area(s) of discomfort, for 15 minutes at a time, several times per day for the next few days.  Do not fall asleep on a heating or ice pack.  Call your regular medical doctor today to schedule a follow up appointment in the next 2 days.  Return to the Emergency Department immediately if worsening.

## 2014-04-28 DIAGNOSIS — Z9181 History of falling: Secondary | ICD-10-CM | POA: Diagnosis not present

## 2014-04-28 DIAGNOSIS — M25559 Pain in unspecified hip: Secondary | ICD-10-CM | POA: Diagnosis not present

## 2014-04-28 DIAGNOSIS — S7000XA Contusion of unspecified hip, initial encounter: Secondary | ICD-10-CM | POA: Diagnosis not present

## 2014-04-28 DIAGNOSIS — S20229A Contusion of unspecified back wall of thorax, initial encounter: Secondary | ICD-10-CM | POA: Diagnosis not present

## 2014-05-02 ENCOUNTER — Other Ambulatory Visit: Payer: Self-pay | Admitting: Internal Medicine

## 2014-05-02 DIAGNOSIS — S298XXA Other specified injuries of thorax, initial encounter: Secondary | ICD-10-CM | POA: Diagnosis not present

## 2014-05-02 DIAGNOSIS — R079 Chest pain, unspecified: Secondary | ICD-10-CM | POA: Diagnosis not present

## 2014-05-02 DIAGNOSIS — M549 Dorsalgia, unspecified: Secondary | ICD-10-CM | POA: Diagnosis not present

## 2014-05-03 ENCOUNTER — Other Ambulatory Visit (HOSPITAL_COMMUNITY): Payer: Self-pay | Admitting: Cardiology

## 2014-05-03 DIAGNOSIS — I6529 Occlusion and stenosis of unspecified carotid artery: Secondary | ICD-10-CM

## 2014-05-04 ENCOUNTER — Encounter (HOSPITAL_COMMUNITY): Payer: Medicare Other

## 2014-05-05 DIAGNOSIS — M6281 Muscle weakness (generalized): Secondary | ICD-10-CM | POA: Diagnosis not present

## 2014-05-05 DIAGNOSIS — M7981 Nontraumatic hematoma of soft tissue: Secondary | ICD-10-CM | POA: Diagnosis not present

## 2014-05-11 ENCOUNTER — Ambulatory Visit (HOSPITAL_COMMUNITY): Payer: Medicare Other | Attending: Internal Medicine | Admitting: Cardiology

## 2014-05-11 DIAGNOSIS — I6529 Occlusion and stenosis of unspecified carotid artery: Secondary | ICD-10-CM

## 2014-05-11 NOTE — Progress Notes (Signed)
Carotid duplex complete 

## 2014-05-14 DIAGNOSIS — I509 Heart failure, unspecified: Secondary | ICD-10-CM | POA: Diagnosis not present

## 2014-05-14 DIAGNOSIS — I130 Hypertensive heart and chronic kidney disease with heart failure and stage 1 through stage 4 chronic kidney disease, or unspecified chronic kidney disease: Secondary | ICD-10-CM | POA: Diagnosis not present

## 2014-05-14 DIAGNOSIS — J449 Chronic obstructive pulmonary disease, unspecified: Secondary | ICD-10-CM | POA: Diagnosis not present

## 2014-05-14 DIAGNOSIS — N189 Chronic kidney disease, unspecified: Secondary | ICD-10-CM | POA: Diagnosis not present

## 2014-05-16 ENCOUNTER — Encounter: Payer: Self-pay | Admitting: Cardiology

## 2014-05-16 NOTE — Telephone Encounter (Signed)
This encounter was created in error - please disregard.

## 2014-05-16 NOTE — Telephone Encounter (Signed)
Follow Up  ° °Pt returned call//SR  °

## 2014-05-18 NOTE — Progress Notes (Signed)
HPI Patient is a 73 yo with a history of CAD (s/p CABG 2004 and DES to L Main. Last LHT 11/12 showed instent restenosis to the LM. LCX was occluded in prox segment. SVG to OM was patient as well as LAD  RCA had signif disease but appeared nondominant  The patient was admitted to [facility] in July 2013 with CP [facility] showed localized ischemia in the inferior wall (base, mid) Ranexa was added. Did not tolerate metoprolol Norvasc added.  She also has a hsitory of recurrent DVT (on chronic coumadin), HTN, COPD, HL and carotid stenosis.   Patient was seen in Sept 2014 Recent carotid USN shows mild progression in dz in R ICA.   Denies CP  Breathing is OK No TIA like symtpoms   Allergies  Allergen Reactions  . Zolpidem Tartrate Anxiety    CONFUSION   . Penicillins Rash    Current Outpatient Prescriptions  Medication Sig Dispense Refill  . ALPRAZolam (XANAX) 0.5 MG tablet Take 0.5 mg by mouth at bedtime.       Marland Kitchen atorvastatin (LIPITOR) 20 MG tablet Take 20 mg by mouth daily.      . citalopram (CELEXA) 20 MG tablet Take 60 mg by mouth daily. Takes one tablet TID      . cyclobenzaprine (FLEXERIL) 10 MG tablet Take 10 mg by mouth 3 (three) times daily as needed.       . ergocalciferol (VITAMIN D2) 50000 UNITS capsule Take 50,000 Units by mouth every Thursday.       . furosemide (LASIX) 80 MG tablet Take 80 mg by mouth 2 (two) times daily.       . isosorbide mononitrate (IMDUR) 60 MG 24 hr tablet Take 60 mg by mouth at bedtime.      Marland Kitchen levothyroxine (SYNTHROID, LEVOTHROID) 137 MCG tablet Take 137 mcg by mouth daily before breakfast.       . lisinopril (PRINIVIL,ZESTRIL) 20 MG tablet Take 20-40 mg by mouth daily. Pt. Reports that she takes her BP & if its elevated she takes 40 mg. Instead of 20mg .      . metoprolol tartrate (LOPRESSOR) 25 MG tablet TAKE 1 TABLET TWICE DAILY.  60 tablet  1  . nitroGLYCERIN (NITROSTAT) 0.4 MG SL tablet Place 0.4 mg under the tongue every 5 (five)  minutes as needed for chest pain. For chest pain      . omeprazole (PRILOSEC) 40 MG capsule Take 40 mg by mouth 2 (two) times daily.       Marland Kitchen oxyCODONE-acetaminophen (PERCOCET) 10-325 MG per tablet Take 1 tablet by mouth every 6 (six) hours as needed for pain.       . pilocarpine (PILOCAR) 4 % ophthalmic solution Place 1 drop into both eyes 2 (two) times daily.      . potassium chloride (KLOR-CON) 20 MEQ packet Take 20 mEq by mouth daily.      . pramipexole (MIRAPEX) 0.25 MG tablet Take 0.25 mg by mouth at bedtime.       . Rivaroxaban (XARELTO) 20 MG TABS Take 20 mg by mouth daily with supper.       . roflumilast (DALIRESP) 500 MCG TABS tablet Take 500 mcg by mouth at bedtime.       Marland Kitchen tiotropium (SPIRIVA) 18 MCG inhalation capsule Place 18 mcg into inhaler and inhale daily.      . traZODone (DESYREL) 50 MG tablet Take 150 mg by mouth at bedtime.       Marland Kitchen VITAMIN D,  ERGOCALCIFEROL, PO Take 1 tablet by mouth daily.       No current facility-administered medications for this visit.    Past Medical History  Diagnosis Date  . Hypertension   . PVD (peripheral vascular disease)   . COPD (chronic obstructive pulmonary disease)     Emphysema dxed by Dr. Woody Seller in [place] based on PFTs per pt in 2006; placed on albuterol  . HLD (hyperlipidemia)   . Osteoarthritis   . DVT of lower extremity (deep venous thrombosis)     recurrent. bilateral (2 episodes)  . Arthralgia     NOS  . Obesity (BMI 30-39.9) 2009    BMI 33  . Hyperthyroidism   . CAD (coronary artery disease)     s/p CABG 2004; s/p DES to LM in 2010;  [place] 10/29/11: EF 50-55%, mild elevated filling pressures, no pulmonary HTN, LM 90% ISR, LAD and CFX occluded, S-RI occluded (old), S-OM3 ok and L-LAD ok, native nondominant RCA 95% -  med rx recommended ; Lexiscan Myoview 7/13 at [facility]: demonstrated "normal LV function, anterior attenuation and localized ischemia, inferior, basilar, mid section"  . Carotid artery occlusion     moderate   . Exertional angina     Treated with Isosorbide, Ranexa, amlodipine; intolerant to metoprolol  . Chronic diastolic heart failure     Echo 9/10: EF 53-97%, grade 1 diastolic dysfunction  . Blood transfusion 2011    WITH BACK SURGERY  . GERD (gastroesophageal reflux disease)   . Depression     TAKES CELEXA  AND  (OFF- WELBUTRIN)  . Sleep apnea 2012    USED CPAP THEN  STARTED USING SPIRIVA , Nov. 2013- last evaluation , changed from mask to aparatus that is just for her nose..   . Anemia     bld. transfusion post lumbar surgery- 2012    Past Surgical History  Procedure Laterality Date  . Ankle surgery  2004  . Groin mass open biopsy  2004  . Neck surgery  12/08    Due for repeat neck surgery  . Breast enhancement surgery    . Total hip arthroplasty  2007    Right  . Coronary artery bypass graft  2004  . Cardiac catheterization  11/2009    Patent LIMA to LAD and patent SVG to OM1. Occluded SVG to ramus and diagonal. Left main: 90% ostial stenosis, LCX 60-70% proximal stenosis  . Coronary angioplasty with stent placement  11/2009    Drug eluting stent to left main artery: 4.0 X 12 mm Ion   . Joint replacement  2012     BILATERAL KNEES  . Joint replacement  2010     RIGHT HIP REPLACEMENT  . Reverse shoulder arthroplasty  02/26/2012    Procedure: REVERSE SHOULDER ARTHROPLASTY;  Surgeon: Marin Shutter, MD;  Location: [employer];  Service: Orthopedics;  Laterality: Right;  RIGHT SHOULDER REVERSED ARTHROPLASTY  . Eye surgery  2006    BILATERAL CAT EXT.   . Back surgery  1986    lower; another scheduled, opt. reports 4 back- lumbar, 3 cerv. fusions  for later 2009  . Shoulder arthroscopy with subacromial decompression Left 02/10/2013    Procedure: SHOULDER ARTHROSCOPY WITH SUBACROMIAL DECOMPRESSION DISTAL CLAVICLE RESECTION;  Surgeon: Marin Shutter, MD;  Location: [facility];  Service: Orthopedics;  Laterality: Left;  DISTAL CLAVICLE RESECTION    Family History  Problem Relation Age of Onset   . Cancer    . Heart disease    .  Colitis    . Cancer Mother     Ovarian  . Cancer Father     Lung    History   Social History  . Marital Status: Married    Spouse Name: N/A    Number of Children: 3  . Years of Education: N/A   Occupational History  . retired     Corporate treasurer  .     Social History Main Topics  . Smoking status: Former Smoker -- 11 years    Quit date: 12/22/2002  . Smokeless tobacco: Never Used  . Alcohol Use: No  . Drug Use: No  . Sexual Activity: No   Other Topics Concern  . Not on file   Social History Narrative   Married 37 years, lives with husband.   3 grown children.    Review of Systems:  All systems reviewed.  They are negative to the above problem except as previously stated.  Vital Signs: BP 140/66  Pulse 79  Ht 5\' 7"  (1.702 m)  Wt 150 lb (68.04 kg)  BMI 23.49 kg/m2  Physical Exam Pat[place] isin NAD HEENT:  Normocephalic, atraumatic. EOMI, PERRLA.  Neck: JVP is normal.  R carotid bruit.  Lungs: clear to auscultation. No rales no wheezes.  Decreased aiflow Heart: Regular rate and rhythm. Normal S1, S2. No S3.   Gr I to II/ VI systolic murmur.  Abdomen:  Supple, nontender. Normal bowel sounds. No masses. No hepatomegaly.  Extremities:   Good distal pulses throughout. Tr to 1+ lower extremity edema.  Musculoskeletal :moving all extremities.  Neuro:   alert and oriented x3.  CN II-XII grossly intact.  EKG  SR 79  First degree AV block  229 msec.  LAFB.  LVH.   Assessment and Plan:   1  CAD  Asymptomatic  Follow 2  HTN  Good control  3  HL  Will need to follow  Keep on lipitor    4.  COPD   5.  Recurrent DVT  On anticoag.  6.  CV disease.  Asymptomatic  Mild progression on R  WIll set f/u for 6 months    F/U in the Dec/Jan

## 2014-05-19 ENCOUNTER — Encounter: Payer: Self-pay | Admitting: Internal Medicine

## 2014-05-19 ENCOUNTER — Ambulatory Visit (INDEPENDENT_AMBULATORY_CARE_PROVIDER_SITE_OTHER): Payer: Medicare Other | Admitting: Internal Medicine

## 2014-05-19 VITALS — BP 140/66 | HR 79 | Ht 67.0 in | Wt 150.0 lb

## 2014-05-19 DIAGNOSIS — I6529 Occlusion and stenosis of unspecified carotid artery: Secondary | ICD-10-CM

## 2014-05-19 DIAGNOSIS — E785 Hyperlipidemia, unspecified: Secondary | ICD-10-CM

## 2014-05-19 NOTE — Patient Instructions (Signed)
Your physician recommends that you return for lab work in: [place].  Your physician wants you to follow-up in: DEC/JAN 2016 WITH DR. Harrington Challenger.   You will receive a reminder letter in the mail two months in advance. If you don't receive a letter, please call our office to schedule the follow-up appointment.  Your physician recommends that you continue on your current medications as directed. Please refer to the Current Medication list given to you today.  Be sure to have carotid doppler study repeated in 6 MONTHS.

## 2014-05-23 ENCOUNTER — Ambulatory Visit (INDEPENDENT_AMBULATORY_CARE_PROVIDER_SITE_OTHER): Payer: Medicare Other | Admitting: *Deleted

## 2014-05-23 DIAGNOSIS — I10 Essential (primary) hypertension: Secondary | ICD-10-CM

## 2014-05-23 DIAGNOSIS — E0590 Thyrotoxicosis, unspecified without thyrotoxic crisis or storm: Secondary | ICD-10-CM | POA: Diagnosis not present

## 2014-05-23 DIAGNOSIS — I82409 Acute embolism and thrombosis of unspecified deep veins of unspecified lower extremity: Secondary | ICD-10-CM | POA: Diagnosis not present

## 2014-05-23 DIAGNOSIS — I2510 Atherosclerotic heart disease of native coronary artery without angina pectoris: Secondary | ICD-10-CM

## 2014-05-23 LAB — AST: AST: 10 U/L (ref 0–37)

## 2014-05-24 LAB — NMR LIPOPROFILE WITH LIPIDS
Cholesterol, Total: 98 mg/dL (ref <200)
HDL Particle Number: 23.9 umol/L — ABNORMAL LOW (ref >=30.5)
HDL Size: 9.2 nm (ref >=9.2)
HDL-C: 35 mg/dL — ABNORMAL LOW (ref >=40)
LDL (calc): 46 mg/dL (ref <100)
LDL Particle Number: 718 nmol/L (ref <1000)
LDL Size: 20.6 nm (ref >20.5)
LP-IR Score: 50 — ABNORMAL HIGH (ref <=45)
Large HDL-P: 4.9 umol/L (ref >=4.8)
Large VLDL-P: 2.3 nmol/L (ref <=2.7)
Small LDL Particle Number: 415 nmol/L (ref <=527)
Triglycerides: 83 mg/dL (ref <150)
VLDL Size: 47 nm — ABNORMAL HIGH (ref <=46.6)

## 2014-06-22 ENCOUNTER — Other Ambulatory Visit: Payer: Self-pay | Admitting: Internal Medicine

## 2014-06-28 DIAGNOSIS — F339 Major depressive disorder, recurrent, unspecified: Secondary | ICD-10-CM | POA: Diagnosis not present

## 2014-06-28 DIAGNOSIS — Z79899 Other long term (current) drug therapy: Secondary | ICD-10-CM | POA: Diagnosis not present

## 2014-06-28 DIAGNOSIS — M6281 Muscle weakness (generalized): Secondary | ICD-10-CM | POA: Diagnosis not present

## 2014-06-28 DIAGNOSIS — M545 Low back pain, unspecified: Secondary | ICD-10-CM | POA: Diagnosis not present

## 2014-07-06 NOTE — Telephone Encounter (Signed)
Close encounter 

## 2014-07-20 ENCOUNTER — Other Ambulatory Visit: Payer: Self-pay | Admitting: Internal Medicine

## 2014-07-21 ENCOUNTER — Other Ambulatory Visit: Payer: Self-pay | Admitting: Neurosurgery

## 2014-07-21 DIAGNOSIS — M5126 Other intervertebral disc displacement, lumbar region: Secondary | ICD-10-CM | POA: Diagnosis not present

## 2014-07-21 DIAGNOSIS — M4120 Other idiopathic scoliosis, site unspecified: Secondary | ICD-10-CM | POA: Diagnosis not present

## 2014-07-24 ENCOUNTER — Other Ambulatory Visit: Payer: Self-pay | Admitting: Neurosurgery

## 2014-07-24 DIAGNOSIS — M5124 Other intervertebral disc displacement, thoracic region: Secondary | ICD-10-CM

## 2014-07-26 ENCOUNTER — Ambulatory Visit
Admission: RE | Admit: 2014-07-26 | Discharge: 2014-07-26 | Disposition: A | Discharge status: Home / Self Care | Payer: Medicare Other | Source: Ambulatory Visit | Attending: Neurosurgery | Admitting: Neurosurgery

## 2014-07-26 DIAGNOSIS — M47814 Spondylosis without myelopathy or radiculopathy, thoracic region: Secondary | ICD-10-CM | POA: Diagnosis not present

## 2014-07-26 DIAGNOSIS — M4804 Spinal stenosis, thoracic region: Secondary | ICD-10-CM | POA: Diagnosis not present

## 2014-07-26 DIAGNOSIS — M5124 Other intervertebral disc displacement, thoracic region: Secondary | ICD-10-CM

## 2014-07-27 DIAGNOSIS — M4120 Other idiopathic scoliosis, site unspecified: Secondary | ICD-10-CM | POA: Diagnosis not present

## 2014-08-16 DIAGNOSIS — M545 Low back pain, unspecified: Secondary | ICD-10-CM | POA: Diagnosis not present

## 2014-08-16 DIAGNOSIS — R269 Unspecified abnormalities of gait and mobility: Secondary | ICD-10-CM | POA: Diagnosis not present

## 2014-08-16 DIAGNOSIS — M6281 Muscle weakness (generalized): Secondary | ICD-10-CM | POA: Diagnosis not present

## 2014-08-18 DIAGNOSIS — R269 Unspecified abnormalities of gait and mobility: Secondary | ICD-10-CM | POA: Diagnosis not present

## 2014-08-18 DIAGNOSIS — M6281 Muscle weakness (generalized): Secondary | ICD-10-CM | POA: Diagnosis not present

## 2014-08-18 DIAGNOSIS — M545 Low back pain, unspecified: Secondary | ICD-10-CM | POA: Diagnosis not present

## 2014-08-22 DIAGNOSIS — M545 Low back pain, unspecified: Secondary | ICD-10-CM | POA: Diagnosis not present

## 2014-08-22 DIAGNOSIS — M6281 Muscle weakness (generalized): Secondary | ICD-10-CM | POA: Diagnosis not present

## 2014-08-22 DIAGNOSIS — R269 Unspecified abnormalities of gait and mobility: Secondary | ICD-10-CM | POA: Diagnosis not present

## 2014-08-24 DIAGNOSIS — M545 Low back pain, unspecified: Secondary | ICD-10-CM | POA: Diagnosis not present

## 2014-08-24 DIAGNOSIS — R269 Unspecified abnormalities of gait and mobility: Secondary | ICD-10-CM | POA: Diagnosis not present

## 2014-08-24 DIAGNOSIS — M6281 Muscle weakness (generalized): Secondary | ICD-10-CM | POA: Diagnosis not present

## 2014-09-01 DIAGNOSIS — M6281 Muscle weakness (generalized): Secondary | ICD-10-CM | POA: Diagnosis not present

## 2014-09-01 DIAGNOSIS — M545 Low back pain, unspecified: Secondary | ICD-10-CM | POA: Diagnosis not present

## 2014-09-01 DIAGNOSIS — R269 Unspecified abnormalities of gait and mobility: Secondary | ICD-10-CM | POA: Diagnosis not present

## 2014-09-05 DIAGNOSIS — M545 Low back pain, unspecified: Secondary | ICD-10-CM | POA: Diagnosis not present

## 2014-09-05 DIAGNOSIS — R269 Unspecified abnormalities of gait and mobility: Secondary | ICD-10-CM | POA: Diagnosis not present

## 2014-09-05 DIAGNOSIS — M6281 Muscle weakness (generalized): Secondary | ICD-10-CM | POA: Diagnosis not present

## 2014-09-07 DIAGNOSIS — M6281 Muscle weakness (generalized): Secondary | ICD-10-CM | POA: Diagnosis not present

## 2014-09-07 DIAGNOSIS — M545 Low back pain, unspecified: Secondary | ICD-10-CM | POA: Diagnosis not present

## 2014-09-07 DIAGNOSIS — R269 Unspecified abnormalities of gait and mobility: Secondary | ICD-10-CM | POA: Diagnosis not present

## 2014-09-28 DIAGNOSIS — F339 Major depressive disorder, recurrent, unspecified: Secondary | ICD-10-CM | POA: Diagnosis not present

## 2014-09-28 DIAGNOSIS — D500 Iron deficiency anemia secondary to blood loss (chronic): Secondary | ICD-10-CM | POA: Diagnosis not present

## 2014-09-28 DIAGNOSIS — Z23 Encounter for immunization: Secondary | ICD-10-CM | POA: Diagnosis not present

## 2014-09-28 DIAGNOSIS — G894 Chronic pain syndrome: Secondary | ICD-10-CM | POA: Diagnosis not present

## 2014-10-09 DIAGNOSIS — B86 Scabies: Secondary | ICD-10-CM | POA: Diagnosis not present

## 2014-10-24 DIAGNOSIS — H3530 Unspecified macular degeneration: Secondary | ICD-10-CM | POA: Diagnosis not present

## 2014-11-03 ENCOUNTER — Other Ambulatory Visit (HOSPITAL_COMMUNITY): Payer: Self-pay | Admitting: *Deleted

## 2014-11-03 DIAGNOSIS — I6523 Occlusion and stenosis of bilateral carotid arteries: Secondary | ICD-10-CM

## 2014-11-20 ENCOUNTER — Encounter (HOSPITAL_COMMUNITY): Payer: Medicare Other

## 2014-11-23 ENCOUNTER — Ambulatory Visit (HOSPITAL_COMMUNITY): Payer: Medicare Other | Attending: Internal Medicine | Admitting: Cardiology

## 2014-11-23 DIAGNOSIS — R0989 Other specified symptoms and signs involving the circulatory and respiratory systems: Secondary | ICD-10-CM | POA: Diagnosis not present

## 2014-11-23 DIAGNOSIS — I6523 Occlusion and stenosis of bilateral carotid arteries: Secondary | ICD-10-CM

## 2014-11-23 DIAGNOSIS — I10 Essential (primary) hypertension: Secondary | ICD-10-CM | POA: Diagnosis not present

## 2014-11-23 DIAGNOSIS — Z87891 Personal history of nicotine dependence: Secondary | ICD-10-CM | POA: Diagnosis not present

## 2014-11-23 DIAGNOSIS — J449 Chronic obstructive pulmonary disease, unspecified: Secondary | ICD-10-CM | POA: Diagnosis not present

## 2014-11-23 DIAGNOSIS — Z951 Presence of aortocoronary bypass graft: Secondary | ICD-10-CM | POA: Insufficient documentation

## 2014-11-23 DIAGNOSIS — I2510 Atherosclerotic heart disease of native coronary artery without angina pectoris: Secondary | ICD-10-CM | POA: Diagnosis not present

## 2014-11-23 DIAGNOSIS — E785 Hyperlipidemia, unspecified: Secondary | ICD-10-CM | POA: Diagnosis not present

## 2014-11-23 NOTE — Progress Notes (Signed)
Carotid duplex performed 

## 2014-11-24 ENCOUNTER — Other Ambulatory Visit: Payer: Self-pay | Admitting: Internal Medicine

## 2014-11-25 NOTE — Telephone Encounter (Signed)
Rx was sent to pharmacy electronically. 

## 2014-11-29 ENCOUNTER — Other Ambulatory Visit: Payer: Self-pay

## 2014-11-29 DIAGNOSIS — R9389 Abnormal findings on diagnostic imaging of other specified body structures: Secondary | ICD-10-CM

## 2014-12-04 ENCOUNTER — Ambulatory Visit
Admission: RE | Admit: 2014-12-04 | Discharge: 2014-12-04 | Disposition: A | Discharge status: Home / Self Care | Payer: Medicare Other | Source: Ambulatory Visit | Attending: Internal Medicine | Admitting: Internal Medicine

## 2014-12-04 DIAGNOSIS — E049 Nontoxic goiter, unspecified: Secondary | ICD-10-CM | POA: Diagnosis not present

## 2014-12-04 DIAGNOSIS — R9389 Abnormal findings on diagnostic imaging of other specified body structures: Secondary | ICD-10-CM

## 2014-12-28 DIAGNOSIS — E785 Hyperlipidemia, unspecified: Secondary | ICD-10-CM | POA: Diagnosis not present

## 2014-12-28 DIAGNOSIS — M8580 Other specified disorders of bone density and structure, unspecified site: Secondary | ICD-10-CM | POA: Diagnosis not present

## 2014-12-28 DIAGNOSIS — G894 Chronic pain syndrome: Secondary | ICD-10-CM | POA: Diagnosis not present

## 2014-12-28 DIAGNOSIS — E039 Hypothyroidism, unspecified: Secondary | ICD-10-CM | POA: Diagnosis not present

## 2014-12-28 DIAGNOSIS — D649 Anemia, unspecified: Secondary | ICD-10-CM | POA: Diagnosis not present

## 2014-12-28 DIAGNOSIS — F339 Major depressive disorder, recurrent, unspecified: Secondary | ICD-10-CM | POA: Diagnosis not present

## 2014-12-28 DIAGNOSIS — Z1389 Encounter for screening for other disorder: Secondary | ICD-10-CM | POA: Diagnosis not present

## 2014-12-28 DIAGNOSIS — Z79899 Other long term (current) drug therapy: Secondary | ICD-10-CM | POA: Diagnosis not present

## 2014-12-28 DIAGNOSIS — E559 Vitamin D deficiency, unspecified: Secondary | ICD-10-CM | POA: Diagnosis not present

## 2014-12-28 DIAGNOSIS — I2510 Atherosclerotic heart disease of native coronary artery without angina pectoris: Secondary | ICD-10-CM | POA: Diagnosis not present

## 2015-02-06 ENCOUNTER — Other Ambulatory Visit: Payer: Self-pay | Admitting: Internal Medicine

## 2015-02-13 DIAGNOSIS — Z7901 Long term (current) use of anticoagulants: Secondary | ICD-10-CM | POA: Diagnosis not present

## 2015-02-13 DIAGNOSIS — R195 Other fecal abnormalities: Secondary | ICD-10-CM | POA: Diagnosis not present

## 2015-02-13 DIAGNOSIS — R634 Abnormal weight loss: Secondary | ICD-10-CM | POA: Diagnosis not present

## 2015-02-13 DIAGNOSIS — D649 Anemia, unspecified: Secondary | ICD-10-CM | POA: Diagnosis not present

## 2015-02-13 DIAGNOSIS — Z7982 Long term (current) use of aspirin: Secondary | ICD-10-CM | POA: Diagnosis not present

## 2015-02-13 DIAGNOSIS — R1032 Left lower quadrant pain: Secondary | ICD-10-CM | POA: Diagnosis not present

## 2015-02-14 DIAGNOSIS — D649 Anemia, unspecified: Secondary | ICD-10-CM | POA: Diagnosis not present

## 2015-02-28 DIAGNOSIS — D125 Benign neoplasm of sigmoid colon: Secondary | ICD-10-CM | POA: Diagnosis not present

## 2015-02-28 DIAGNOSIS — K319 Disease of stomach and duodenum, unspecified: Secondary | ICD-10-CM | POA: Diagnosis not present

## 2015-02-28 DIAGNOSIS — D500 Iron deficiency anemia secondary to blood loss (chronic): Secondary | ICD-10-CM | POA: Diagnosis not present

## 2015-02-28 DIAGNOSIS — K2901 Acute gastritis with bleeding: Secondary | ICD-10-CM | POA: Diagnosis not present

## 2015-02-28 DIAGNOSIS — Z791 Long term (current) use of non-steroidal anti-inflammatories (NSAID): Secondary | ICD-10-CM | POA: Diagnosis not present

## 2015-02-28 DIAGNOSIS — K5730 Diverticulosis of large intestine without perforation or abscess without bleeding: Secondary | ICD-10-CM | POA: Diagnosis not present

## 2015-02-28 DIAGNOSIS — R195 Other fecal abnormalities: Secondary | ICD-10-CM | POA: Diagnosis not present

## 2015-03-28 DIAGNOSIS — K2901 Acute gastritis with bleeding: Secondary | ICD-10-CM | POA: Diagnosis not present

## 2015-03-28 DIAGNOSIS — F411 Generalized anxiety disorder: Secondary | ICD-10-CM | POA: Diagnosis not present

## 2015-03-28 DIAGNOSIS — R195 Other fecal abnormalities: Secondary | ICD-10-CM | POA: Diagnosis not present

## 2015-03-28 DIAGNOSIS — R634 Abnormal weight loss: Secondary | ICD-10-CM | POA: Diagnosis not present

## 2015-03-28 DIAGNOSIS — I10 Essential (primary) hypertension: Secondary | ICD-10-CM | POA: Diagnosis not present

## 2015-03-28 DIAGNOSIS — D509 Iron deficiency anemia, unspecified: Secondary | ICD-10-CM | POA: Diagnosis not present

## 2015-03-28 DIAGNOSIS — G894 Chronic pain syndrome: Secondary | ICD-10-CM | POA: Diagnosis not present

## 2015-03-29 DIAGNOSIS — D509 Iron deficiency anemia, unspecified: Secondary | ICD-10-CM | POA: Diagnosis not present

## 2015-04-10 DIAGNOSIS — D509 Iron deficiency anemia, unspecified: Secondary | ICD-10-CM | POA: Diagnosis not present

## 2015-04-23 ENCOUNTER — Other Ambulatory Visit: Payer: Self-pay | Admitting: Internal Medicine

## 2015-05-24 ENCOUNTER — Encounter: Payer: Self-pay | Admitting: Internal Medicine

## 2015-05-24 ENCOUNTER — Ambulatory Visit (INDEPENDENT_AMBULATORY_CARE_PROVIDER_SITE_OTHER): Payer: Medicare Other | Admitting: Internal Medicine

## 2015-05-24 VITALS — BP 120/68 | HR 88 | Ht 67.0 in | Wt 148.0 lb

## 2015-05-24 DIAGNOSIS — I2510 Atherosclerotic heart disease of native coronary artery without angina pectoris: Secondary | ICD-10-CM | POA: Diagnosis not present

## 2015-05-24 NOTE — Progress Notes (Signed)
Cardiology Office Note   Date:  05/24/2015   ID:  JAKEYA GHERARDI, DOB 1941-02-06, MRN [id number]  PCP:  [name], MD  Cardiologist:   Dorris Carnes, MD   No chief complaint on file.     History of Present Illness: [name] is a 74 y.o. female with a history of  Patient is a 74 yo with a history of CAD (s/p CABG 2004 and DES to [place]. Last LHT 11/12 showed instent restenosis to the LM. LCX was occluded in prox segment. SVG to OM was patient as well as LAD  RCA had signif disease but appeared nondominant  The patient was admitted to [facility] in July 2013 with CP [facility] showed localized ischemia in the inferior wall (base, mid) Ranexa was added. Did not tolerate metoprolol Norvasc added.  She also has a hsitory of recurrent DVT (on chronic coumadin), HTN, COPD, HL and carotid stenosis.   Patient was seen in May 2015 Recent carotid USN shows mild progression in dz in R ICA.  Denies CP Breathing is OK No TIA like symtpoms      Current Outpatient Prescriptions  Medication Sig Dispense Refill  . ALPRAZolam (XANAX) 0.5 MG tablet Take 0.5 mg by mouth at bedtime.     Marland Kitchen atorvastatin (LIPITOR) 20 MG tablet TAKE 1 TABLET ONCE DAILY. 30 tablet 5  . citalopram (CELEXA) 20 MG tablet Take 60 mg by mouth daily. Takes one tablet TID    . cyclobenzaprine (FLEXERIL) 10 MG tablet Take 10 mg by mouth 3 (three) times daily as needed.     . ergocalciferol (VITAMIN D2) 50000 UNITS capsule Take 50,000 Units by mouth every Thursday.     . furosemide (LASIX) 80 MG tablet Take 80 mg by mouth 2 (two) times daily.     . isosorbide mononitrate (IMDUR) 60 MG 24 hr tablet TAKE 1 TABLET AT BEDTIME. 90 tablet 0  . levothyroxine (SYNTHROID, LEVOTHROID) 137 MCG tablet Take 137 mcg by mouth daily before breakfast.     . lisinopril (PRINIVIL,ZESTRIL) 20 MG tablet Take 20-40 mg by mouth daily. Pt. Reports that she takes her BP & if its elevated she takes 40 mg. Instead of 69m.      . metoprolol tartrate (LOPRESSOR) 25 MG tablet TAKE 1 TABLET TWICE DAILY. 60 tablet 3  . nitroGLYCERIN (NITROSTAT) 0.4 MG SL tablet Place 0.4 mg under the tongue every 5 (five) minutes as needed for chest pain. For chest pain    . omeprazole (PRILOSEC) 40 MG capsule Take 40 mg by mouth 2 (two) times daily.     .Marland KitchenoxyCODONE-acetaminophen (PERCOCET) 10-325 MG per tablet Take 1 tablet by mouth every 6 (six) hours as needed for pain.     . pilocarpine (PILOCAR) 4 % ophthalmic solution Place 1 drop into both eyes 2 (two) times daily.    . potassium chloride (KLOR-CON) 20 MEQ packet Take 20 mEq by mouth daily.    . pramipexole (MIRAPEX) 0.25 MG tablet Take 0.25 mg by mouth at bedtime.     . Rivaroxaban (XARELTO) 20 MG TABS Take 20 mg by mouth daily with supper.     . roflumilast (DALIRESP) 500 MCG TABS tablet Take 500 mcg by mouth at bedtime.     .Marland Kitchentiotropium (SPIRIVA) 18 MCG inhalation capsule Place 18 mcg into inhaler and inhale daily.    . traZODone (DESYREL) 50 MG tablet Take 150 mg by mouth at bedtime.     .Marland KitchenVITAMIN  D, ERGOCALCIFEROL, PO Take 1 tablet by mouth daily.     No current facility-administered medications for this visit.    Allergies:   Zolpidem tartrate and Penicillins   Past Medical History  Diagnosis Date  . Hypertension   . PVD (peripheral vascular disease)   . COPD (chronic obstructive pulmonary disease)     Emphysema dxed by Dr. Woody Seller in [place] based on PFTs per pt in 2006; placed on albuterol  . HLD (hyperlipidemia)   . Osteoarthritis   . DVT of lower extremity (deep venous thrombosis)     recurrent. bilateral (2 episodes)  . Arthralgia     NOS  . Obesity (BMI 30-39.9) 2009    BMI 33  . Hyperthyroidism   . CAD (coronary artery disease)     s/p CABG 2004; s/p DES to LM in 2010;  [place] 10/29/11: EF 50-55%, mild elevated filling pressures, no pulmonary HTN, LM 90% ISR, LAD and CFX occluded, S-RI occluded (old), S-OM3 ok and L-LAD ok, native nondominant RCA 95% -  med rx  recommended ; Lexiscan Myoview 7/13 at [facility]: demonstrated "normal LV function, anterior attenuation and localized ischemia, inferior, basilar, mid section"  . Carotid artery occlusion     moderate  . Exertional angina     Treated with Isosorbide, Ranexa, amlodipine; intolerant to metoprolol  . Chronic diastolic heart failure     Echo 9/10: EF 50-53%, grade 1 diastolic dysfunction  . Blood transfusion 2011    WITH BACK SURGERY  . GERD (gastroesophageal reflux disease)   . Depression     TAKES CELEXA  AND  (OFF- WELBUTRIN)  . Sleep apnea 2012    USED CPAP THEN  STARTED USING SPIRIVA , Nov. 2013- last evaluation , changed from mask to aparatus that is just for her nose..   . Anemia     bld. transfusion post lumbar surgery- 2012    Past Surgical History  Procedure Laterality Date  . Ankle surgery  2004  . Groin mass open biopsy  2004  . Neck surgery  12/08    Due for repeat neck surgery  . Breast enhancement surgery    . Total hip arthroplasty  2007    Right  . Coronary artery bypass graft  2004  . Cardiac catheterization  11/2009    Patent LIMA to LAD and patent SVG to OM1. Occluded SVG to ramus and diagonal. Left main: 90% ostial stenosis, LCX 60-70% proximal stenosis  . Coronary angioplasty with stent placement  11/2009    Drug eluting stent to left main artery: 4.0 X 12 mm Ion   . Joint replacement  2012     BILATERAL KNEES  . Joint replacement  2010     RIGHT HIP REPLACEMENT  . Reverse shoulder arthroplasty  02/26/2012    Procedure: REVERSE SHOULDER ARTHROPLASTY;  Surgeon: Marin Shutter, MD;  Location: [place];  Service: Orthopedics;  Laterality: Right;  RIGHT SHOULDER REVERSED ARTHROPLASTY  . Eye surgery  2006    BILATERAL CAT EXT.   . Back surgery  1986    lower; another scheduled, opt. reports 4 back- lumbar, 3 cerv. fusions  for later 2009  . Shoulder arthroscopy with subacromial decompression Left 02/10/2013    Procedure: SHOULDER ARTHROSCOPY WITH SUBACROMIAL  DECOMPRESSION DISTAL CLAVICLE RESECTION;  Surgeon: Marin Shutter, MD;  Location: [place];  Service: Orthopedics;  Laterality: Left;  DISTAL CLAVICLE RESECTION     Social History:  The patient  reports that she quit smoking  about 12 years ago. She has never used smokeless tobacco. She reports that she does not drink alcohol or use illicit drugs.   Family History:  The patient's family history includes Cancer in her father, mother, and another family member; Colitis in an other family member; Heart disease in an other family member.    ROS:  Please see the history of present illness. All other systems are reviewed and  Negative to the above problem except as noted.    PHYSICAL EXAM: VS:  BP 120/68 mmHg  Pulse 88  Ht 5' 7"  (1.702 m)  Wt 148 lb (67.132 kg)  BMI 23.17 kg/m2  GEN: Well nourished, well developed, in no acute distress HEENT: normal Neck: no JVD, carotid bruits, or masses Cardiac: RRR; no murmurs, rubs, or gallops,no edema  Respiratory:  Decreased airflow   GI: soft, nontender, nondistended, + BS  No hepatomegaly  MS: no deformity Moving all extremities   Skin: warm and dry, no rash Neuro:  Strength and sensation are intact Psych: euthymic mood, full affect   EKG:  EKG is ordered today.  SR 88  First degree AV block  PR 212  Msec     Lipid Panel    Component Value Date/Time   CHOL 98 05/23/2014 0857   CHOL 111 03/03/2013 1136   TRIG 83 05/23/2014 0857   TRIG 122.0 03/03/2013 1136   HDL 35* 05/23/2014 0857   HDL 27.10* 03/03/2013 1136   CHOLHDL 4 03/03/2013 1136   VLDL 24.4 03/03/2013 1136   LDLCALC 46 05/23/2014 0857   LDLCALC 60 03/03/2013 1136      Wt Readings from Last 3 Encounters:  05/24/15 148 lb (67.132 kg)  05/19/14 150 lb (68.04 kg)  09/01/13 165 lb (74.844 kg)      ASSESSMENT AND PLAN:  1.  CAD  I do not think the patient is having active angina.  Continue medical Rx 2.  HTN  Good control  3.  HL  Keep on lipitor  Will get labs from  primary MD  4.  COPD  Signinf  No acute changes  5.  Recurrent DVT.  Continue on coumadin  Followed there   6.  CV dz  Mod stable plaquing of carotids  Folr call back in the summer     Labs/ tests ordered today include:  Get labs from primary  No orders of the defined types were placed in this encounter.     Disposition:   FU with me in 12 months    Signed, Dorris Carnes, MD  05/24/2015 2:27 PM    [address], [place], [place]  [address] Phone: [phone]; Fax: [phone]

## 2015-05-24 NOTE — Patient Instructions (Signed)
Medication Instructions:  No changes today  Labwork: None today  Testing/Procedures: None today  Follow-Up: Your physician wants you to follow-up in: 1 year with Dr Harrington Challenger. (June 2017).  You will receive a reminder letter in the mail two months in advance. If you don't receive a letter, please call our office to schedule the follow-up appointment.

## 2015-06-12 ENCOUNTER — Other Ambulatory Visit: Payer: Self-pay | Admitting: Internal Medicine

## 2015-06-12 DIAGNOSIS — I6523 Occlusion and stenosis of bilateral carotid arteries: Secondary | ICD-10-CM

## 2015-06-18 ENCOUNTER — Ambulatory Visit (HOSPITAL_COMMUNITY): Payer: Medicare Other | Attending: Cardiovascular Disease

## 2015-06-18 ENCOUNTER — Other Ambulatory Visit: Payer: Medicare Other

## 2015-06-18 ENCOUNTER — Other Ambulatory Visit: Payer: Self-pay

## 2015-06-18 DIAGNOSIS — I6523 Occlusion and stenosis of bilateral carotid arteries: Secondary | ICD-10-CM | POA: Diagnosis not present

## 2015-06-27 DIAGNOSIS — G894 Chronic pain syndrome: Secondary | ICD-10-CM | POA: Diagnosis not present

## 2015-06-27 DIAGNOSIS — Z79899 Other long term (current) drug therapy: Secondary | ICD-10-CM | POA: Diagnosis not present

## 2015-09-26 DIAGNOSIS — Z79899 Other long term (current) drug therapy: Secondary | ICD-10-CM | POA: Diagnosis not present

## 2015-09-26 DIAGNOSIS — M545 Low back pain: Secondary | ICD-10-CM | POA: Diagnosis not present

## 2015-09-26 DIAGNOSIS — G894 Chronic pain syndrome: Secondary | ICD-10-CM | POA: Diagnosis not present

## 2015-09-26 DIAGNOSIS — Z23 Encounter for immunization: Secondary | ICD-10-CM | POA: Diagnosis not present

## 2015-10-01 DIAGNOSIS — S91209A Unspecified open wound of unspecified toe(s) with damage to nail, initial encounter: Secondary | ICD-10-CM | POA: Diagnosis not present

## 2015-10-02 DIAGNOSIS — S90222A Contusion of left lesser toe(s) with damage to nail, initial encounter: Secondary | ICD-10-CM | POA: Diagnosis not present

## 2015-10-16 DIAGNOSIS — S90212D Contusion of left great toe with damage to nail, subsequent encounter: Secondary | ICD-10-CM | POA: Diagnosis not present

## 2015-11-08 ENCOUNTER — Other Ambulatory Visit: Payer: Self-pay | Admitting: Neurosurgery

## 2015-11-08 DIAGNOSIS — M5137 Other intervertebral disc degeneration, lumbosacral region: Secondary | ICD-10-CM

## 2015-11-21 ENCOUNTER — Ambulatory Visit
Admission: RE | Admit: 2015-11-21 | Discharge: 2015-11-21 | Disposition: A | Discharge status: Home / Self Care | Payer: Medicare Other | Source: Ambulatory Visit | Attending: Neurosurgery | Admitting: Neurosurgery

## 2015-11-21 DIAGNOSIS — M5137 Other intervertebral disc degeneration, lumbosacral region: Secondary | ICD-10-CM

## 2015-11-21 DIAGNOSIS — M5126 Other intervertebral disc displacement, lumbar region: Secondary | ICD-10-CM | POA: Diagnosis not present

## 2015-11-21 DIAGNOSIS — M47816 Spondylosis without myelopathy or radiculopathy, lumbar region: Secondary | ICD-10-CM | POA: Diagnosis not present

## 2015-11-29 ENCOUNTER — Other Ambulatory Visit: Payer: Self-pay | Admitting: Neurosurgery

## 2015-11-29 DIAGNOSIS — I739 Peripheral vascular disease, unspecified: Secondary | ICD-10-CM | POA: Diagnosis not present

## 2015-11-29 DIAGNOSIS — M5127 Other intervertebral disc displacement, lumbosacral region: Secondary | ICD-10-CM | POA: Diagnosis not present

## 2015-11-29 DIAGNOSIS — M5136 Other intervertebral disc degeneration, lumbar region: Secondary | ICD-10-CM | POA: Diagnosis not present

## 2015-11-29 DIAGNOSIS — M419 Scoliosis, unspecified: Secondary | ICD-10-CM | POA: Diagnosis not present

## 2015-12-03 ENCOUNTER — Other Ambulatory Visit: Payer: Self-pay | Admitting: Internal Medicine

## 2015-12-03 DIAGNOSIS — I6523 Occlusion and stenosis of bilateral carotid arteries: Secondary | ICD-10-CM

## 2015-12-05 ENCOUNTER — Ambulatory Visit (HOSPITAL_COMMUNITY)
Admission: RE | Admit: 2015-12-05 | Discharge: 2015-12-05 | Disposition: A | Discharge status: Home / Self Care | Payer: Medicare Other | Source: Ambulatory Visit | Attending: Vascular Surgery | Admitting: Vascular Surgery

## 2015-12-05 ENCOUNTER — Other Ambulatory Visit (HOSPITAL_COMMUNITY): Payer: Self-pay | Admitting: Internal Medicine

## 2015-12-05 ENCOUNTER — Other Ambulatory Visit (HOSPITAL_COMMUNITY): Payer: Self-pay | Admitting: Neurosurgery

## 2015-12-05 DIAGNOSIS — I739 Peripheral vascular disease, unspecified: Secondary | ICD-10-CM | POA: Diagnosis not present

## 2015-12-05 DIAGNOSIS — E785 Hyperlipidemia, unspecified: Secondary | ICD-10-CM | POA: Insufficient documentation

## 2015-12-05 DIAGNOSIS — I10 Essential (primary) hypertension: Secondary | ICD-10-CM | POA: Insufficient documentation

## 2015-12-07 ENCOUNTER — Other Ambulatory Visit: Payer: Medicare Other

## 2015-12-13 ENCOUNTER — Other Ambulatory Visit: Payer: Self-pay | Admitting: Neurosurgery

## 2015-12-13 ENCOUNTER — Ambulatory Visit
Admission: RE | Admit: 2015-12-13 | Discharge: 2015-12-13 | Disposition: A | Discharge status: Home / Self Care | Payer: Medicare Other | Source: Ambulatory Visit | Attending: Neurosurgery | Admitting: Neurosurgery

## 2015-12-13 DIAGNOSIS — M5127 Other intervertebral disc displacement, lumbosacral region: Secondary | ICD-10-CM | POA: Diagnosis not present

## 2015-12-13 DIAGNOSIS — M5023 Other cervical disc displacement, cervicothoracic region: Secondary | ICD-10-CM | POA: Diagnosis not present

## 2015-12-13 DIAGNOSIS — I700 Atherosclerosis of aorta: Secondary | ICD-10-CM | POA: Diagnosis not present

## 2015-12-13 DIAGNOSIS — M5136 Other intervertebral disc degeneration, lumbar region: Secondary | ICD-10-CM

## 2015-12-13 DIAGNOSIS — I739 Peripheral vascular disease, unspecified: Secondary | ICD-10-CM | POA: Diagnosis not present

## 2015-12-13 MED ORDER — IOPAMIDOL (ISOVUE-370) INJECTION 76%
150.0000 mL | Freq: Once | INTRAVENOUS | Status: AC | PRN
  Administered 2015-12-13: 150 mL via INTRAVENOUS

## 2015-12-19 ENCOUNTER — Ambulatory Visit (HOSPITAL_COMMUNITY)
Admission: RE | Admit: 2015-12-19 | Discharge: 2015-12-19 | Disposition: A | Discharge status: Home / Self Care | Payer: Medicare Other | Source: Ambulatory Visit | Attending: Cardiology | Admitting: Cardiology

## 2015-12-19 DIAGNOSIS — E785 Hyperlipidemia, unspecified: Secondary | ICD-10-CM | POA: Diagnosis not present

## 2015-12-19 DIAGNOSIS — I10 Essential (primary) hypertension: Secondary | ICD-10-CM | POA: Insufficient documentation

## 2015-12-19 DIAGNOSIS — I6523 Occlusion and stenosis of bilateral carotid arteries: Secondary | ICD-10-CM | POA: Diagnosis not present

## 2015-12-25 ENCOUNTER — Other Ambulatory Visit: Payer: Medicare Other

## 2015-12-25 ENCOUNTER — Ambulatory Visit
Admission: RE | Admit: 2015-12-25 | Discharge: 2015-12-25 | Disposition: A | Discharge status: Home / Self Care | Payer: Medicare Other | Source: Ambulatory Visit | Attending: Neurosurgery | Admitting: Neurosurgery

## 2015-12-25 DIAGNOSIS — M5023 Other cervical disc displacement, cervicothoracic region: Secondary | ICD-10-CM

## 2015-12-25 DIAGNOSIS — M5124 Other intervertebral disc displacement, thoracic region: Secondary | ICD-10-CM | POA: Diagnosis not present

## 2015-12-27 ENCOUNTER — Other Ambulatory Visit: Payer: Medicare Other

## 2015-12-28 ENCOUNTER — Encounter: Payer: Self-pay | Admitting: Vascular Surgery

## 2015-12-28 DIAGNOSIS — E559 Vitamin D deficiency, unspecified: Secondary | ICD-10-CM | POA: Diagnosis not present

## 2015-12-28 DIAGNOSIS — K219 Gastro-esophageal reflux disease without esophagitis: Secondary | ICD-10-CM | POA: Diagnosis not present

## 2015-12-28 DIAGNOSIS — Z1389 Encounter for screening for other disorder: Secondary | ICD-10-CM | POA: Diagnosis not present

## 2015-12-28 DIAGNOSIS — F330 Major depressive disorder, recurrent, mild: Secondary | ICD-10-CM | POA: Diagnosis not present

## 2015-12-28 DIAGNOSIS — D649 Anemia, unspecified: Secondary | ICD-10-CM | POA: Diagnosis not present

## 2015-12-28 DIAGNOSIS — E039 Hypothyroidism, unspecified: Secondary | ICD-10-CM | POA: Diagnosis not present

## 2015-12-28 DIAGNOSIS — I10 Essential (primary) hypertension: Secondary | ICD-10-CM | POA: Diagnosis not present

## 2015-12-28 DIAGNOSIS — I5032 Chronic diastolic (congestive) heart failure: Secondary | ICD-10-CM | POA: Diagnosis not present

## 2015-12-28 DIAGNOSIS — G894 Chronic pain syndrome: Secondary | ICD-10-CM | POA: Diagnosis not present

## 2015-12-28 DIAGNOSIS — I2510 Atherosclerotic heart disease of native coronary artery without angina pectoris: Secondary | ICD-10-CM | POA: Diagnosis not present

## 2015-12-28 DIAGNOSIS — Z79899 Other long term (current) drug therapy: Secondary | ICD-10-CM | POA: Diagnosis not present

## 2015-12-28 DIAGNOSIS — E7800 Pure hypercholesterolemia, unspecified: Secondary | ICD-10-CM | POA: Diagnosis not present

## 2015-12-28 DIAGNOSIS — J439 Emphysema, unspecified: Secondary | ICD-10-CM | POA: Diagnosis not present

## 2016-01-01 DIAGNOSIS — M419 Scoliosis, unspecified: Secondary | ICD-10-CM | POA: Diagnosis not present

## 2016-01-01 DIAGNOSIS — M5137 Other intervertebral disc degeneration, lumbosacral region: Secondary | ICD-10-CM | POA: Diagnosis not present

## 2016-01-01 DIAGNOSIS — M5023 Other cervical disc displacement, cervicothoracic region: Secondary | ICD-10-CM | POA: Diagnosis not present

## 2016-01-04 ENCOUNTER — Other Ambulatory Visit: Payer: Self-pay

## 2016-01-04 ENCOUNTER — Encounter: Payer: Self-pay | Admitting: Vascular Surgery

## 2016-01-04 ENCOUNTER — Ambulatory Visit (INDEPENDENT_AMBULATORY_CARE_PROVIDER_SITE_OTHER): Payer: Medicare Other | Admitting: Vascular Surgery

## 2016-01-04 ENCOUNTER — Ambulatory Visit (HOSPITAL_COMMUNITY)
Admission: RE | Admit: 2016-01-04 | Discharge: 2016-01-04 | Disposition: A | Discharge status: Home / Self Care | Payer: Medicare Other | Source: Ambulatory Visit | Attending: Vascular Surgery | Admitting: Vascular Surgery

## 2016-01-04 VITALS — BP 110/68 | HR 84 | Temp 97.3°F | Resp 16 | Ht 67.0 in | Wt 163.0 lb

## 2016-01-04 DIAGNOSIS — I70209 Unspecified atherosclerosis of native arteries of extremities, unspecified extremity: Secondary | ICD-10-CM | POA: Diagnosis not present

## 2016-01-04 DIAGNOSIS — R0989 Other specified symptoms and signs involving the circulatory and respiratory systems: Secondary | ICD-10-CM

## 2016-01-04 NOTE — Progress Notes (Signed)
[facility] and [facility] Specialist of [facility]  Patient name: [name] MRN: [id number] DOB: 1941-02-04 Sex: female  REASON FOR CONSULT: Peripheral vascular disease. Referred by Dr. Kary Kos.   HPI: [name] is a 75 y.o. female, who has noted pain in both lower extremities for proximally 2 years. The symptoms came on gradually. She experiences pain from her upper back down both legs which occurs both with ambulating and also with standing. The symptoms are equal on both sides. I do not get any history of calf claudication, rest pain, or nonhealing ulcers. Her symptoms have been fairly stable recently. There are no other aggravating or alleviating factors.  Her risk factors for peripheral vascular disease include hypertension and  Hyperlipidemia. She denies any history of diabetes, family history of premature cardiovascular disease, or history of tobacco use.  Have reviewed the records that were sent from [place] office. She did have a CT scan of the lumbar spine on 11/21/2015 which shows solid interbody fusion at L1-L5. The spinal canal and neural foramina are well decompressed at all levels. She does have some segmental disease at T12-L1 with disc degeneration. No spinal stenosis or nerve root encroachment is noted.  Past Medical History  Diagnosis Date  . Hypertension   . PVD (peripheral vascular disease) ([name])   . COPD (chronic obstructive pulmonary disease) ([place])     Emphysema dxed by Dr. Woody Seller in [place] based on PFTs per pt in 2006; placed on albuterol  . HLD (hyperlipidemia)   . Osteoarthritis   . DVT of lower extremity (deep venous thrombosis) ([facility])     recurrent. bilateral (2 episodes)  . Arthralgia     NOS  . Obesity (BMI 30-39.9) 2009    BMI 33  . Hyperthyroidism   . CAD (coronary artery disease)     s/p CABG 2004; s/p DES to LM in 2010;  [place] 10/29/11: EF 50-55%, mild elevated filling pressures, no pulmonary HTN, LM 90% ISR, LAD and CFX occluded, S-RI occluded (old),  S-OM3 ok and L-LAD ok, native nondominant RCA 95% -  med rx recommended ; Lexiscan Myoview 7/13 at [facility]: demonstrated "normal LV function, anterior attenuation and localized ischemia, inferior, basilar, mid section"  . Carotid artery occlusion     moderate  . Exertional angina ([facility])     Treated with Isosorbide, Ranexa, amlodipine; intolerant to metoprolol  . Chronic diastolic heart failure ([facility])     Echo 9/10: EF [id number], grade 1 diastolic dysfunction  . Blood transfusion 2011    WITH BACK SURGERY  . GERD (gastroesophageal reflux disease)   . Depression     TAKES CELEXA  AND  (OFF- WELBUTRIN)  . Sleep apnea 2012    USED CPAP THEN  STARTED USING SPIRIVA , Nov. 2013- last evaluation , changed from mask to aparatus that is just for her nose..   . Anemia     bld. transfusion post lumbar surgery- 2012    Family History  Problem Relation Age of Onset  . Cancer    . Heart disease    . Colitis    . Cancer Mother     Ovarian  . Cancer Father     Lung    SOCIAL HISTORY: Social History   Social History  . Marital Status: Married    Spouse Name: N/A  . Number of Children: 3  . Years of Education: N/A   Occupational History  . retired     Corporate treasurer  .  Social History Main Topics  . Smoking status: Former Smoker -- 52 years    Quit date: 12/22/2002  . Smokeless tobacco: Never Used  . Alcohol Use: No  . Drug Use: No  . Sexual Activity: No   Other Topics Concern  . Not on file   Social History Narrative   Married 44 years, lives with husband.   3 grown children.    Allergies  Allergen Reactions  . Zolpidem Tartrate Anxiety    CONFUSION   . Penicillins Rash    Current Outpatient Prescriptions  Medication Sig Dispense Refill  . ALPRAZolam (XANAX) 0.5 MG tablet Take 0.5 mg by mouth at bedtime.     . ARIPiprazole (ABILIFY) 2 MG tablet     . atorvastatin (LIPITOR) 20 MG tablet TAKE 1 TABLET ONCE DAILY. 30 tablet 5  . citalopram (CELEXA) 20 MG tablet  Take 60 mg by mouth daily. Takes one tablet TID    . cyclobenzaprine (FLEXERIL) 10 MG tablet Take 10 mg by mouth 3 (three) times daily as needed.     . ergocalciferol (VITAMIN D2) 50000 UNITS capsule Take 50,000 Units by mouth every Thursday.     . furosemide (LASIX) 80 MG tablet Take 80 mg by mouth 2 (two) times daily.     . isosorbide mononitrate (IMDUR) 60 MG 24 hr tablet TAKE 1 TABLET AT BEDTIME. 90 tablet 0  . levothyroxine (SYNTHROID, LEVOTHROID) 137 MCG tablet Take 137 mcg by mouth daily before breakfast.     . lisinopril (PRINIVIL,ZESTRIL) 20 MG tablet Take 20-40 mg by mouth daily. Pt. Reports that she takes her BP & if its elevated she takes 40 mg. Instead of 20mg .    . nitroGLYCERIN (NITROSTAT) 0.4 MG SL tablet Place 0.4 mg under the tongue every 5 (five) minutes as needed for chest pain. For chest pain    . omeprazole (PRILOSEC) 40 MG capsule Take 40 mg by mouth 2 (two) times daily.     Marland Kitchen oxyCODONE-acetaminophen (PERCOCET) 10-325 MG per tablet Take 1 tablet by mouth every 6 (six) hours as needed for pain.     . pilocarpine (PILOCAR) 4 % ophthalmic solution Place 1 drop into both eyes 2 (two) times daily.    . potassium chloride (KLOR-CON) 20 MEQ packet Take 20 mEq by mouth daily.    . pramipexole (MIRAPEX) 0.25 MG tablet Take 0.25 mg by mouth at bedtime.     . Rivaroxaban (XARELTO) 20 MG TABS Take 20 mg by mouth daily with supper.     . roflumilast (DALIRESP) 500 MCG TABS tablet Take 500 mcg by mouth at bedtime.     Marland Kitchen tiotropium (SPIRIVA) 18 MCG inhalation capsule Place 18 mcg into inhaler and inhale daily.    . traZODone (DESYREL) 50 MG tablet Take 150 mg by mouth at bedtime.     Marland Kitchen VITAMIN D, ERGOCALCIFEROL, PO Take 1 tablet by mouth daily.     No current facility-administered medications for this visit.    REVIEW OF SYSTEMS:  [X]  denotes positive finding, [ ]  denotes negative finding Cardiac  Comments:  Chest pain or chest pressure: X   Shortness of breath upon exertion: X     Short of breath when lying flat:    Irregular heart rhythm:        Vascular    Pain in calf, thigh, or hip brought on by ambulation: X   Pain in feet at night that wakes you up from your sleep:     Blood clot  in your veins:    Leg swelling:  X       Pulmonary    Oxygen at home: X   Productive cough:     Wheezing:         Neurologic    Sudden weakness in arms or legs:     Sudden numbness in arms or legs:     Sudden onset of difficulty speaking or slurred speech:    Temporary loss of vision in one eye:     Problems with dizziness:         Gastrointestinal    Blood in stool:     Vomited blood:         Genitourinary    Burning when urinating:     Blood in urine:        Psychiatric    Major depression:         Hematologic    Bleeding problems:    Problems with blood clotting too easily:        Skin    Rashes or ulcers:        Constitutional    Fever or chills:      PHYSICAL EXAM: Filed Vitals:   01/04/16 1117  BP: 110/68  Pulse: 84  Temp: 97.3 F (36.3 C)  TempSrc: Oral  Resp: 16  Height: 5\' 7"  (1.702 m)  Weight: 163 lb (73.936 kg)  SpO2: 95%    GENERAL: The patient is a well-nourished female, in no acute distress. The vital signs are documented above. CARDIAC: There is a regular rate and rhythm.  VASCULAR: She has bilateral carotid bruits. She has palpable femoral, popliteal, dorsalis pedis, and posterior tibial pulses bilaterally. She has bilateral lower extremity swelling. PULMONARY: There is good air exchange bilaterally without wheezing or rales. ABDOMEN: Soft and non-tender with normal pitched bowel sounds.  MUSCULOSKELETAL: There are no major deformities or cyanosis. NEUROLOGIC: No focal weakness or paresthesias are detected. SKIN: There are no ulcers or rashes noted. PSYCHIATRIC: The patient has a normal affect.  DATA:   LOWER EXTREMITY ARTERIAL DOPPLER STUDY: I have independently interpreted her lower extremity arterial Doppler study. On  the right side she has an ABI of greater than 100% with triphasic Doppler signals in the dorsalis pedis and posterior tibial positions. On the left side, ABIs greater than 100%. She has triphasic Doppler signals in the dorsalis pedis and posterior tibial positions.  The patient also had a CT angiogram on 12/13/2015. This shows mild atherosclerotic changes of the aorta but no significant stenosis is noted. She has 3 vessel runoff bilaterally.  MEDICAL ISSUES:  PERIPHERAL VASCULAR DISEASE: I have reviewed the images of her CT angiogram and she does have diffuse calcific disease throughout her aorto iliac arteries but no focal stenosis. Likewise she has no significant infrainguinal arterial occlusive disease. She has palpable pedal pulses. Her waveforms in both feet are normal. Her ABIs are falsely elevated because of her calcific disease. I do not think that her symptoms in her legs can be attributed to peripheral vascular disease. She quit smoking in 2004. Given that she is having paresthesias in her feet and numbness in both legs I think that the symptoms sound neurogenic in origin. She does have some disc disease in her lower thoracic spine. I'll be happy to see her back in anytime if any new vascular issues arise.   Deitra Mayo [facility] of [place]: [phone]

## 2016-01-09 ENCOUNTER — Ambulatory Visit: Payer: Medicare Other | Admitting: Vascular Surgery

## 2016-01-15 DIAGNOSIS — M5136 Other intervertebral disc degeneration, lumbar region: Secondary | ICD-10-CM | POA: Diagnosis not present

## 2016-01-15 DIAGNOSIS — M6281 Muscle weakness (generalized): Secondary | ICD-10-CM | POA: Diagnosis not present

## 2016-01-15 DIAGNOSIS — R2689 Other abnormalities of gait and mobility: Secondary | ICD-10-CM | POA: Diagnosis not present

## 2016-01-15 DIAGNOSIS — M5023 Other cervical disc displacement, cervicothoracic region: Secondary | ICD-10-CM | POA: Diagnosis not present

## 2016-01-17 DIAGNOSIS — M6281 Muscle weakness (generalized): Secondary | ICD-10-CM | POA: Diagnosis not present

## 2016-01-17 DIAGNOSIS — R2689 Other abnormalities of gait and mobility: Secondary | ICD-10-CM | POA: Diagnosis not present

## 2016-01-17 DIAGNOSIS — M5136 Other intervertebral disc degeneration, lumbar region: Secondary | ICD-10-CM | POA: Diagnosis not present

## 2016-01-17 DIAGNOSIS — M5023 Other cervical disc displacement, cervicothoracic region: Secondary | ICD-10-CM | POA: Diagnosis not present

## 2016-01-22 DIAGNOSIS — M5136 Other intervertebral disc degeneration, lumbar region: Secondary | ICD-10-CM | POA: Diagnosis not present

## 2016-01-22 DIAGNOSIS — R2689 Other abnormalities of gait and mobility: Secondary | ICD-10-CM | POA: Diagnosis not present

## 2016-01-22 DIAGNOSIS — M5023 Other cervical disc displacement, cervicothoracic region: Secondary | ICD-10-CM | POA: Diagnosis not present

## 2016-01-22 DIAGNOSIS — M6281 Muscle weakness (generalized): Secondary | ICD-10-CM | POA: Diagnosis not present

## 2016-01-23 DIAGNOSIS — Z1231 Encounter for screening mammogram for malignant neoplasm of breast: Secondary | ICD-10-CM | POA: Diagnosis not present

## 2016-01-23 DIAGNOSIS — M810 Age-related osteoporosis without current pathological fracture: Secondary | ICD-10-CM | POA: Diagnosis not present

## 2016-01-23 DIAGNOSIS — Z780 Asymptomatic menopausal state: Secondary | ICD-10-CM | POA: Diagnosis not present

## 2016-01-24 DIAGNOSIS — R2689 Other abnormalities of gait and mobility: Secondary | ICD-10-CM | POA: Diagnosis not present

## 2016-01-24 DIAGNOSIS — M6281 Muscle weakness (generalized): Secondary | ICD-10-CM | POA: Diagnosis not present

## 2016-01-24 DIAGNOSIS — M5023 Other cervical disc displacement, cervicothoracic region: Secondary | ICD-10-CM | POA: Diagnosis not present

## 2016-01-24 DIAGNOSIS — M5136 Other intervertebral disc degeneration, lumbar region: Secondary | ICD-10-CM | POA: Diagnosis not present

## 2016-01-29 DIAGNOSIS — M6281 Muscle weakness (generalized): Secondary | ICD-10-CM | POA: Diagnosis not present

## 2016-01-29 DIAGNOSIS — R2689 Other abnormalities of gait and mobility: Secondary | ICD-10-CM | POA: Diagnosis not present

## 2016-01-29 DIAGNOSIS — M5023 Other cervical disc displacement, cervicothoracic region: Secondary | ICD-10-CM | POA: Diagnosis not present

## 2016-01-29 DIAGNOSIS — M5136 Other intervertebral disc degeneration, lumbar region: Secondary | ICD-10-CM | POA: Diagnosis not present

## 2016-01-31 DIAGNOSIS — R2689 Other abnormalities of gait and mobility: Secondary | ICD-10-CM | POA: Diagnosis not present

## 2016-01-31 DIAGNOSIS — M5136 Other intervertebral disc degeneration, lumbar region: Secondary | ICD-10-CM | POA: Diagnosis not present

## 2016-01-31 DIAGNOSIS — M5023 Other cervical disc displacement, cervicothoracic region: Secondary | ICD-10-CM | POA: Diagnosis not present

## 2016-01-31 DIAGNOSIS — M6281 Muscle weakness (generalized): Secondary | ICD-10-CM | POA: Diagnosis not present

## 2016-02-05 DIAGNOSIS — R2689 Other abnormalities of gait and mobility: Secondary | ICD-10-CM | POA: Diagnosis not present

## 2016-02-05 DIAGNOSIS — M6281 Muscle weakness (generalized): Secondary | ICD-10-CM | POA: Diagnosis not present

## 2016-02-05 DIAGNOSIS — M5023 Other cervical disc displacement, cervicothoracic region: Secondary | ICD-10-CM | POA: Diagnosis not present

## 2016-02-05 DIAGNOSIS — M5136 Other intervertebral disc degeneration, lumbar region: Secondary | ICD-10-CM | POA: Diagnosis not present

## 2016-02-07 DIAGNOSIS — R2689 Other abnormalities of gait and mobility: Secondary | ICD-10-CM | POA: Diagnosis not present

## 2016-02-07 DIAGNOSIS — M5136 Other intervertebral disc degeneration, lumbar region: Secondary | ICD-10-CM | POA: Diagnosis not present

## 2016-02-07 DIAGNOSIS — M5023 Other cervical disc displacement, cervicothoracic region: Secondary | ICD-10-CM | POA: Diagnosis not present

## 2016-02-07 DIAGNOSIS — M6281 Muscle weakness (generalized): Secondary | ICD-10-CM | POA: Diagnosis not present

## 2016-02-11 DIAGNOSIS — M818 Other osteoporosis without current pathological fracture: Secondary | ICD-10-CM | POA: Diagnosis not present

## 2016-02-12 DIAGNOSIS — M6281 Muscle weakness (generalized): Secondary | ICD-10-CM | POA: Diagnosis not present

## 2016-02-12 DIAGNOSIS — M5136 Other intervertebral disc degeneration, lumbar region: Secondary | ICD-10-CM | POA: Diagnosis not present

## 2016-02-12 DIAGNOSIS — M5023 Other cervical disc displacement, cervicothoracic region: Secondary | ICD-10-CM | POA: Diagnosis not present

## 2016-02-12 DIAGNOSIS — R2689 Other abnormalities of gait and mobility: Secondary | ICD-10-CM | POA: Diagnosis not present

## 2016-02-14 DIAGNOSIS — M5136 Other intervertebral disc degeneration, lumbar region: Secondary | ICD-10-CM | POA: Diagnosis not present

## 2016-02-14 DIAGNOSIS — R413 Other amnesia: Secondary | ICD-10-CM | POA: Diagnosis not present

## 2016-02-14 DIAGNOSIS — F331 Major depressive disorder, recurrent, moderate: Secondary | ICD-10-CM | POA: Diagnosis not present

## 2016-02-14 DIAGNOSIS — N390 Urinary tract infection, site not specified: Secondary | ICD-10-CM | POA: Diagnosis not present

## 2016-02-14 DIAGNOSIS — M5023 Other cervical disc displacement, cervicothoracic region: Secondary | ICD-10-CM | POA: Diagnosis not present

## 2016-02-14 DIAGNOSIS — R2689 Other abnormalities of gait and mobility: Secondary | ICD-10-CM | POA: Diagnosis not present

## 2016-02-14 DIAGNOSIS — M6281 Muscle weakness (generalized): Secondary | ICD-10-CM | POA: Diagnosis not present

## 2016-02-19 DIAGNOSIS — M5023 Other cervical disc displacement, cervicothoracic region: Secondary | ICD-10-CM | POA: Diagnosis not present

## 2016-02-19 DIAGNOSIS — R2689 Other abnormalities of gait and mobility: Secondary | ICD-10-CM | POA: Diagnosis not present

## 2016-02-19 DIAGNOSIS — M5136 Other intervertebral disc degeneration, lumbar region: Secondary | ICD-10-CM | POA: Diagnosis not present

## 2016-02-19 DIAGNOSIS — M6281 Muscle weakness (generalized): Secondary | ICD-10-CM | POA: Diagnosis not present

## 2016-02-20 DIAGNOSIS — M5023 Other cervical disc displacement, cervicothoracic region: Secondary | ICD-10-CM | POA: Diagnosis not present

## 2016-02-20 DIAGNOSIS — M5136 Other intervertebral disc degeneration, lumbar region: Secondary | ICD-10-CM | POA: Diagnosis not present

## 2016-02-20 DIAGNOSIS — M6281 Muscle weakness (generalized): Secondary | ICD-10-CM | POA: Diagnosis not present

## 2016-02-20 DIAGNOSIS — R2689 Other abnormalities of gait and mobility: Secondary | ICD-10-CM | POA: Diagnosis not present

## 2016-02-28 DIAGNOSIS — N390 Urinary tract infection, site not specified: Secondary | ICD-10-CM | POA: Diagnosis not present

## 2016-03-04 DIAGNOSIS — M5137 Other intervertebral disc degeneration, lumbosacral region: Secondary | ICD-10-CM | POA: Diagnosis not present

## 2016-03-04 DIAGNOSIS — I739 Peripheral vascular disease, unspecified: Secondary | ICD-10-CM | POA: Diagnosis not present

## 2016-03-07 DIAGNOSIS — N3001 Acute cystitis with hematuria: Secondary | ICD-10-CM | POA: Diagnosis not present

## 2016-03-07 DIAGNOSIS — R1030 Lower abdominal pain, unspecified: Secondary | ICD-10-CM | POA: Diagnosis not present

## 2016-03-07 DIAGNOSIS — R311 Benign essential microscopic hematuria: Secondary | ICD-10-CM | POA: Diagnosis not present

## 2016-03-07 DIAGNOSIS — R351 Nocturia: Secondary | ICD-10-CM | POA: Diagnosis not present

## 2016-03-31 ENCOUNTER — Encounter: Payer: Self-pay | Admitting: Vascular Surgery

## 2016-04-02 ENCOUNTER — Ambulatory Visit (INDEPENDENT_AMBULATORY_CARE_PROVIDER_SITE_OTHER): Payer: Medicare Other | Admitting: Vascular Surgery

## 2016-04-02 ENCOUNTER — Encounter: Payer: Self-pay | Admitting: Vascular Surgery

## 2016-04-02 VITALS — BP 129/71 | HR 87 | Ht 67.0 in | Wt 173.4 lb

## 2016-04-02 DIAGNOSIS — I70209 Unspecified atherosclerosis of native arteries of extremities, unspecified extremity: Secondary | ICD-10-CM | POA: Diagnosis not present

## 2016-04-02 NOTE — Progress Notes (Signed)
[facility]  Patient name: [name] MRN: [id number] DOB: 15-Jun-1941 Sex: female  REASON FOR VISIT: Follow up of peripheral vascular disease.  HPI: [name] is a 75 y.o. female who I saw on 01/04/2016. She had been referred by Dr. Kary Kos with peripheral vascular disease. At that time she had palpable pedal pulses.. She so had normal ABIs of 100% bilaterally with triphasic Doppler signals in both feet. CT angiogram in December 2016 showed some mild atherosclerotic changes of the aorta but no significant stenosis. She did have some degenerative disc disease in her lower thoracic spine thought perhaps this could explain some of her symptoms. I plan on seeing her back as needed.  I reviewed the note from Dr. Windy Carina office dated 03/04/2016. She is continued to have some symptoms in the lower extremities and was referred back for further vascular evaluation.  He does describe bilateral hip claudication which is more significant on the left side. Her symptoms are brought on by ambulation and relieved with rest. She denies any thigh or calf claudication. She denies any history of rest pain or nonhealing ulcers.  She quit smoking in 2004.  He is on Xeralto.  Past Medical History  Diagnosis Date  . Hypertension   . PVD (peripheral vascular disease) ([place])   . COPD (chronic obstructive pulmonary disease) ([place])     Emphysema dxed by Dr. Woody Seller in [place] based on PFTs per pt in 2006; placed on albuterol  . HLD (hyperlipidemia)   . Osteoarthritis   . DVT of lower extremity (deep venous thrombosis) (HCC)     recurrent. bilateral (2 episodes)  . Arthralgia     NOS  . Obesity (BMI 30-39.9) 2009    BMI 33  . Hyperthyroidism   . CAD (coronary artery disease)     s/p CABG 2004; s/p DES to LM in 2010;  [place] 10/29/11: EF 50-55%, mild elevated filling pressures, no pulmonary HTN, LM 90% ISR, LAD and CFX occluded, S-RI occluded (old), S-OM3 ok and L-LAD ok, native  nondominant RCA 95% -  med rx recommended ; Lexiscan Myoview 7/13 at [facility]: demonstrated "normal LV function, anterior attenuation and localized ischemia, inferior, basilar, mid section"  . Carotid artery occlusion     moderate  . Exertional angina (HCC)     Treated with Isosorbide, Ranexa, amlodipine; intolerant to metoprolol  . Chronic diastolic heart failure (HCC)     Echo 9/10: EF Q000111Q, grade 1 diastolic dysfunction  . Blood transfusion 2011    WITH BACK SURGERY  . GERD (gastroesophageal reflux disease)   . Depression     TAKES CELEXA  AND  (OFF- WELBUTRIN)  . Sleep apnea 2012    USED CPAP THEN  STARTED USING SPIRIVA , Nov. 2013- last evaluation , changed from mask to aparatus that is just for her nose..   . Anemia     bld. transfusion post lumbar surgery- 2012    Family History  Problem Relation Age of Onset  . Cancer    . Heart disease    . Colitis    . Cancer Mother     Ovarian  . Cancer Father     Lung    SOCIAL HISTORY: Social History  Substance Use Topics  . Smoking status: Former Smoker -- 50 years    Quit date: 12/22/2002  . Smokeless tobacco: Never Used  . Alcohol Use: No    Allergies  Allergen Reactions  . Zolpidem Tartrate  Anxiety    CONFUSION   . Penicillins Rash    Current Outpatient Prescriptions  Medication Sig Dispense Refill  . ALPRAZolam (XANAX) 0.5 MG tablet Take 0.5 mg by mouth at bedtime.     Marland Kitchen atorvastatin (LIPITOR) 20 MG tablet TAKE 1 TABLET ONCE DAILY. 30 tablet 5  . citalopram (CELEXA) 20 MG tablet Take 60 mg by mouth daily. Takes one tablet TID    . cyclobenzaprine (FLEXERIL) 10 MG tablet Take 10 mg by mouth 3 (three) times daily as needed.     . ergocalciferol (VITAMIN D2) 50000 UNITS capsule Take 50,000 Units by mouth every Thursday.     . furosemide (LASIX) 80 MG tablet Take 80 mg by mouth 2 (two) times daily.     . isosorbide mononitrate (IMDUR) 60 MG 24 hr tablet TAKE 1 TABLET AT BEDTIME. 90 tablet 0  . levothyroxine  (SYNTHROID, LEVOTHROID) 137 MCG tablet Take 137 mcg by mouth daily before breakfast.     . lisinopril (PRINIVIL,ZESTRIL) 20 MG tablet Take 20-40 mg by mouth daily. Pt. Reports that she takes her BP & if its elevated she takes 40 mg. Instead of 20mg .    . nitroGLYCERIN (NITROSTAT) 0.4 MG SL tablet Place 0.4 mg under the tongue every 5 (five) minutes as needed for chest pain. For chest pain    . omeprazole (PRILOSEC) 40 MG capsule Take 40 mg by mouth 2 (two) times daily.     Marland Kitchen oxyCODONE-acetaminophen (PERCOCET) 10-325 MG per tablet Take 1 tablet by mouth every 6 (six) hours as needed for pain.     . pilocarpine (PILOCAR) 4 % ophthalmic solution Place 1 drop into both eyes 2 (two) times daily.    . potassium chloride (KLOR-CON) 20 MEQ packet Take 20 mEq by mouth daily.    . pramipexole (MIRAPEX) 0.25 MG tablet Take 0.25 mg by mouth at bedtime.     . Rivaroxaban (XARELTO) 20 MG TABS Take 20 mg by mouth daily with supper.     . roflumilast (DALIRESP) 500 MCG TABS tablet Take 500 mcg by mouth at bedtime.     Marland Kitchen tiotropium (SPIRIVA) 18 MCG inhalation capsule Place 18 mcg into inhaler and inhale daily.    . traZODone (DESYREL) 50 MG tablet Take 150 mg by mouth at bedtime.     Marland Kitchen VITAMIN D, ERGOCALCIFEROL, PO Take 1 tablet by mouth daily.    . ARIPiprazole (ABILIFY) 2 MG tablet Reported on 04/02/2016     No current facility-administered medications for this visit.    REVIEW OF SYSTEMS:  [X]  denotes positive finding, [ ]  denotes negative finding Cardiac  Comments:  Chest pain or chest pressure:    Shortness of breath upon exertion: X   Short of breath when lying flat: X   Irregular heart rhythm:        Vascular    Pain in calf, thigh, or hip brought on by ambulation: X   Pain in feet at night that wakes you up from your sleep:     Blood clot in your veins: X   Leg swelling:  X       Pulmonary    Oxygen at home: X   Productive cough:     Wheezing:         Neurologic    Sudden weakness in arms  or legs:     Sudden numbness in arms or legs:     Sudden onset of difficulty speaking or slurred speech:    Temporary loss  of vision in one eye:     Problems with dizziness:         Gastrointestinal    Blood in stool:     Vomited blood:         Genitourinary    Burning when urinating:     Blood in urine: X       Psychiatric    Major depression:  X       Hematologic    Bleeding problems:    Problems with blood clotting too easily: X       Skin    Rashes or ulcers:        Constitutional    Fever or chills:      PHYSICAL EXAM: Filed Vitals:   04/02/16 1322  BP: 129/71  Pulse: 87  Height: 5\' 7"  (1.702 m)  Weight: 173 lb 6.4 oz (78.654 kg)  SpO2: 94%    GENERAL: The patient is a well-nourished female, in no acute distress. The vital signs are documented above. CARDIAC: There is a regular rate and rhythm.  VASCULAR: I do not detect carotid bruits. On the right side, she has a palpable femoral pulse and palpable pedal pulses. On the left side she may have a slightly diminished femoral pulse although it was difficult to assess as she cannot straighten her knee out because of her back pain. She has a weakly palpable dorsalis pedis pulse on the left. PULMONARY: There is good air exchange bilaterally without wheezing or rales. ABDOMEN: Soft and non-tender with normal pitched bowel sounds.  MUSCULOSKELETAL: There are no major deformities or cyanosis. NEUROLOGIC: No focal weakness or paresthesias are detected. SKIN: There are no ulcers or rashes noted. PSYCHIATRIC: The patient has a normal affect.   MEDICAL ISSUES:  BILATERAL HIP CLAUDICATION: Her previous Doppler studies were normal at rest. However it is certainly possible that she has some aortoiliac occlusive disease, would not be detected on a resting study. She is not a smoker. We have discussed the importance of getting a structured walking program. She is planning a trip out [place] and I'll see her back when she returns  from her trip. If her symptoms persist and I think we should proceed with an aortogram to evaluate for any aortoiliac occlusive disease. This could potentially be addressed with angioplasty and stenting if she does have significant disease. He will call sooner if she has problems. If she does decide to proceed with an arteriogram we will have to hold her Alen Blew for 48 hours prior to the procedure.   Deitra Mayo [facility] [place]: [phone]

## 2016-04-09 DIAGNOSIS — Z5181 Encounter for therapeutic drug level monitoring: Secondary | ICD-10-CM | POA: Diagnosis not present

## 2016-04-09 DIAGNOSIS — F331 Major depressive disorder, recurrent, moderate: Secondary | ICD-10-CM | POA: Diagnosis not present

## 2016-04-09 DIAGNOSIS — G894 Chronic pain syndrome: Secondary | ICD-10-CM | POA: Diagnosis not present

## 2016-04-22 ENCOUNTER — Encounter: Payer: Self-pay | Admitting: *Deleted

## 2016-04-24 ENCOUNTER — Ambulatory Visit (INDEPENDENT_AMBULATORY_CARE_PROVIDER_SITE_OTHER): Payer: Medicare Other | Admitting: Internal Medicine

## 2016-04-24 ENCOUNTER — Encounter: Payer: Self-pay | Admitting: Internal Medicine

## 2016-04-24 VITALS — BP 142/62 | HR 94 | Ht 67.0 in | Wt 171.1 lb

## 2016-04-24 DIAGNOSIS — I10 Essential (primary) hypertension: Secondary | ICD-10-CM

## 2016-04-24 DIAGNOSIS — I70209 Unspecified atherosclerosis of native arteries of extremities, unspecified extremity: Secondary | ICD-10-CM | POA: Diagnosis not present

## 2016-04-24 MED ORDER — NITROGLYCERIN 0.4 MG SL SUBL: 0.4000 mg | SUBLINGUAL_TABLET | SUBLINGUAL | Status: AC | PRN

## 2016-04-24 NOTE — Patient Instructions (Signed)
Your physician recommends that you continue on your current medications as directed. Please refer to the Current Medication list given to you today. Your physician wants you to follow-up in: October 2017 with Dr. Harrington Challenger.  You will receive a reminder letter in the mail two months in advance. If you don't receive a letter, please call our office to schedule the follow-up appointment.

## 2016-04-24 NOTE — Progress Notes (Signed)
Cardiology Office Note   Date:  04/24/2016   ID:  [name], DOB [birth year], MRN [id number]  PCP:  [name], MD  Cardiologist:   Dorris Carnes, MD   F/U of CAD    History of Present Illness: [name] is a 75 y.o. female with a history of CAD (s/p CABG 2004 and DES to [place]. Last LHT 11/12 showed instent restenosis to the LM. LCX was occluded in prox segment. SVG to OM was patient as well as LAD  RCA had signif disease but appeared nondominant  The patient was admitted to [facility] in July 2013 with CP [facility] showed localized ischemia in the inferior wall (base, mid) Ranexa was added. Did not tolerate metoprolol Norvasc added.  She also has a hsitory of recurrent DVT (on chronic coumadin), HTN, COPD, HL and carotid stenosis.   Patient was seen in Sept 2014 Recent carotid USN shows mild progression in dz in R ICA.  Denies CP Breathing is OK No TIA like symtpoms   I saw the pat last ib June 2016  Pt says occasionally SOB  WIll get occasional chwest discomfrot with exertion  ONe nito relieves Prob not doing as much because back hurting more    Outpatient Prescriptions Prior to Visit  Medication Sig Dispense Refill  . ALPRAZolam (XANAX) 0.5 MG tablet Take 0.5 mg by mouth at bedtime.     Marland Kitchen atorvastatin (LIPITOR) 20 MG tablet TAKE 1 TABLET ONCE DAILY. 30 tablet 5  . citalopram (CELEXA) 20 MG tablet Take 60 mg by mouth daily. Takes one tablet TID    . cyclobenzaprine (FLEXERIL) 10 MG tablet Take 10 mg by mouth 3 (three) times daily as needed.     . ergocalciferol (VITAMIN D2) 50000 UNITS capsule Take 50,000 Units by mouth every Thursday.     . furosemide (LASIX) 80 MG tablet Take 80 mg by mouth 2 (two) times daily.     . isosorbide mononitrate (IMDUR) 60 MG 24 hr tablet TAKE 1 TABLET AT BEDTIME. 90 tablet 0  . levothyroxine (SYNTHROID, LEVOTHROID) 137 MCG tablet Take 137 mcg by mouth daily before breakfast.     . lisinopril (PRINIVIL,ZESTRIL) 20  MG tablet Take 20-40 mg by mouth daily. Pt. Reports that she takes her BP & if its elevated she takes 40 mg. Instead of 20mg .    . nitroGLYCERIN (NITROSTAT) 0.4 MG SL tablet Place 0.4 mg under the tongue every 5 (five) minutes as needed for chest pain. For chest pain    . omeprazole (PRILOSEC) 40 MG capsule Take 40 mg by mouth 2 (two) times daily.     Marland Kitchen oxyCODONE-acetaminophen (PERCOCET) 10-325 MG per tablet Take 1 tablet by mouth every 6 (six) hours as needed for pain.     . pilocarpine (PILOCAR) 4 % ophthalmic solution Place 1 drop into both eyes 2 (two) times daily.    . potassium chloride (KLOR-CON) 20 MEQ packet Take 20 mEq by mouth daily.    . pramipexole (MIRAPEX) 0.25 MG tablet Take 0.25 mg by mouth at bedtime.     . Rivaroxaban (XARELTO) 20 MG TABS Take 20 mg by mouth daily with supper.     . roflumilast (DALIRESP) 500 MCG TABS tablet Take 500 mcg by mouth at bedtime.     Marland Kitchen tiotropium (SPIRIVA) 18 MCG inhalation capsule Place 18 mcg into inhaler and inhale daily.    . traZODone (DESYREL) 50 MG tablet Take 150 mg by mouth at bedtime.     Marland Kitchen  VITAMIN D, ERGOCALCIFEROL, PO Take 1 tablet by mouth daily.    . ARIPiprazole (ABILIFY) 2 MG tablet Reported on 04/24/2016     No facility-administered medications prior to visit.     Allergies:   Zolpidem tartrate and Penicillins   Past Medical History  Diagnosis Date  . Hypertension   . PVD (peripheral vascular disease) ([place])   . COPD (chronic obstructive pulmonary disease) ([place])     Emphysema dxed by Dr. Woody Seller in [place] based on PFTs per pt in 2006; placed on albuterol  . HLD (hyperlipidemia)   . Osteoarthritis   . DVT of lower extremity (deep venous thrombosis) (HCC)     recurrent. bilateral (2 episodes)  . Arthralgia     NOS  . Obesity (BMI 30-39.9) 2009    BMI 33  . Hyperthyroidism   . CAD (coronary artery disease)     s/p CABG 2004; s/p DES to LM in 2010;  [place] 10/29/11: EF 50-55%, mild elevated filling pressures, no pulmonary HTN,  LM 90% ISR, LAD and CFX occluded, S-RI occluded (old), S-OM3 ok and L-LAD ok, native nondominant RCA 95% -  med rx recommended ; Lexiscan Myoview 7/13 at [facility]: demonstrated "normal LV function, anterior attenuation and localized ischemia, inferior, basilar, mid section"  . Carotid artery occlusion     moderate  . Exertional angina (HCC)     Treated with Isosorbide, Ranexa, amlodipine; intolerant to metoprolol  . Chronic diastolic heart failure (HCC)     Echo 9/10: EF Q000111Q, grade 1 diastolic dysfunction  . Blood transfusion 2011    WITH BACK SURGERY  . GERD (gastroesophageal reflux disease)   . Depression     TAKES CELEXA  AND  (OFF- WELBUTRIN)  . Sleep apnea 2012    USED CPAP THEN  STARTED USING SPIRIVA , Nov. 2013- last evaluation , changed from mask to aparatus that is just for her nose..   . Anemia     bld. transfusion post lumbar surgery- 2012    Past Surgical History  Procedure Laterality Date  . Ankle surgery  2004  . Groin mass open biopsy  2004  . Neck surgery  12/08    Due for repeat neck surgery  . Breast enhancement surgery    . Total hip arthroplasty  2007    Right  . Coronary artery bypass graft  2004  . Cardiac catheterization  11/2009    Patent LIMA to LAD and patent SVG to OM1. Occluded SVG to ramus and diagonal. Left main: 90% ostial stenosis, LCX 60-70% proximal stenosis  . Coronary angioplasty with stent placement  11/2009    Drug eluting stent to left main artery: 4.0 X 12 mm Ion   . Joint replacement  2012     BILATERAL KNEES  . Joint replacement  2010     RIGHT HIP REPLACEMENT  . Reverse shoulder arthroplasty  02/26/2012    Procedure: REVERSE SHOULDER ARTHROPLASTY;  Surgeon: Marin Shutter, MD;  Location: [facility];  Service: Orthopedics;  Laterality: Right;  RIGHT SHOULDER REVERSED ARTHROPLASTY  . Eye surgery  2006    BILATERAL CAT EXT.   . Back surgery  1986    lower; another scheduled, opt. reports 4 back- lumbar, 3 cerv. fusions  for later 2009    . Shoulder arthroscopy with subacromial decompression Left 02/10/2013    Procedure: SHOULDER ARTHROSCOPY WITH SUBACROMIAL DECOMPRESSION DISTAL CLAVICLE RESECTION;  Surgeon: Marin Shutter, MD;  Location: [facility];  Service: Orthopedics;  Laterality: Left;  DISTAL CLAVICLE RESECTION     Social History:  The patient  reports that she quit smoking about 13 years ago. She has never used smokeless tobacco. She reports that she does not drink alcohol or use illicit drugs.   Family History:  The patient's family history includes Lung cancer in her father; Ovarian cancer in her mother.    ROS:  Please see the history of present illness. All other systems are reviewed and  Negative to the above problem except as noted.    PHYSICAL EXAM: VS:  BP 142/62 mmHg  Pulse 94  Ht 5\' 7"  (1.702 m)  Wt 171 lb 1.9 oz (77.62 kg)  BMI 26.80 kg/m2  SpO2 96%  GEN: Well nourished, well developed, in no acute distress HEENT: normal Neck: no JVD, carotid bruits, or masses Cardiac: RRR; no murmurs, rubs, or gallops, tr  edema  Respiratory:  clear to auscultation bilaterally, normal work of breathing GI: soft, nontender, nondistended, + BS  No hepatomegaly  MS: no deformity Moving all extremities   Skin: warm and dry, no rash Neuro:  Strength and sensation are intact Psych: euthymic mood, full affect   EKG:  EKG is ordered today.  SR 86 bpm  First degree AV block PR 206 msec  Incomp RBBB  LVH with reporl abnormalitiy  Septal MI     Lipid Panel    Component Value Date/Time   CHOL 98 05/23/2014 0857   CHOL 111 03/03/2013 1136   TRIG 83 05/23/2014 0857   TRIG 122.0 03/03/2013 1136   HDL 35* 05/23/2014 0857   HDL 27.10* 03/03/2013 1136   CHOLHDL 4 03/03/2013 1136   VLDL 24.4 03/03/2013 1136   LDLCALC 46 05/23/2014 0857   LDLCALC 60 03/03/2013 1136      Wt Readings from Last 3 Encounters:  04/24/16 171 lb 1.9 oz (77.62 kg)  04/02/16 173 lb 6.4 oz (78.654 kg)  01/04/16 163 lb (73.936 kg)       ASSESSMENT AND PLAN:  1  CAD  S/p CABG  Pt doing OK with chronic stable angina   I would keep on same regimen for now  Take activiteis as tolerated  I encouraged her to stay active  2.  HL  Will get lipids from primary MD  Pt plans to go to [facility] by [facility] for 2 months in June  I think this is OK  Take activies as tolerated  NTG As needed   I will follow up in October     Signed, Dorris Carnes, MD  04/24/2016 10:06 AM    [address], [place], [place]  [address] Phone: [phone]; Fax: [phone]

## 2016-04-25 ENCOUNTER — Ambulatory Visit: Payer: Medicare Other | Admitting: Internal Medicine

## 2016-05-01 NOTE — Addendum Note (Signed)
Addended by: Dorothyann Gibbs on: 05/01/2016 03:07 PM   Modules accepted: Orders

## 2016-05-06 DIAGNOSIS — M5137 Other intervertebral disc degeneration, lumbosacral region: Secondary | ICD-10-CM | POA: Diagnosis not present

## 2016-05-06 DIAGNOSIS — M961 Postlaminectomy syndrome, not elsewhere classified: Secondary | ICD-10-CM | POA: Diagnosis not present

## 2016-05-15 DIAGNOSIS — M961 Postlaminectomy syndrome, not elsewhere classified: Secondary | ICD-10-CM | POA: Diagnosis not present

## 2016-05-15 DIAGNOSIS — M5137 Other intervertebral disc degeneration, lumbosacral region: Secondary | ICD-10-CM | POA: Diagnosis not present

## 2016-07-23 DIAGNOSIS — I10 Essential (primary) hypertension: Secondary | ICD-10-CM | POA: Diagnosis not present

## 2016-07-23 DIAGNOSIS — Z79899 Other long term (current) drug therapy: Secondary | ICD-10-CM | POA: Diagnosis not present

## 2016-07-23 DIAGNOSIS — M5416 Radiculopathy, lumbar region: Secondary | ICD-10-CM | POA: Diagnosis not present

## 2016-07-23 DIAGNOSIS — M961 Postlaminectomy syndrome, not elsewhere classified: Secondary | ICD-10-CM | POA: Diagnosis not present

## 2016-07-23 DIAGNOSIS — G8929 Other chronic pain: Secondary | ICD-10-CM | POA: Diagnosis not present

## 2016-07-23 DIAGNOSIS — G894 Chronic pain syndrome: Secondary | ICD-10-CM | POA: Diagnosis not present

## 2016-07-23 DIAGNOSIS — M5440 Lumbago with sciatica, unspecified side: Secondary | ICD-10-CM | POA: Diagnosis not present

## 2016-07-23 DIAGNOSIS — M542 Cervicalgia: Secondary | ICD-10-CM | POA: Diagnosis not present

## 2016-07-23 DIAGNOSIS — M5412 Radiculopathy, cervical region: Secondary | ICD-10-CM | POA: Diagnosis not present

## 2016-07-24 DIAGNOSIS — Z5181 Encounter for therapeutic drug level monitoring: Secondary | ICD-10-CM | POA: Diagnosis not present

## 2016-07-24 DIAGNOSIS — G894 Chronic pain syndrome: Secondary | ICD-10-CM | POA: Diagnosis not present

## 2016-07-24 DIAGNOSIS — Z79899 Other long term (current) drug therapy: Secondary | ICD-10-CM | POA: Diagnosis not present

## 2016-07-24 DIAGNOSIS — F330 Major depressive disorder, recurrent, mild: Secondary | ICD-10-CM | POA: Diagnosis not present

## 2016-08-18 ENCOUNTER — Other Ambulatory Visit: Payer: Self-pay

## 2016-08-20 ENCOUNTER — Encounter (HOSPITAL_COMMUNITY): Payer: Medicare Other

## 2016-08-20 ENCOUNTER — Ambulatory Visit: Payer: Medicare Other | Admitting: Vascular Surgery

## 2016-08-26 DIAGNOSIS — G894 Chronic pain syndrome: Secondary | ICD-10-CM | POA: Diagnosis not present

## 2016-08-26 DIAGNOSIS — Z79899 Other long term (current) drug therapy: Secondary | ICD-10-CM | POA: Diagnosis not present

## 2016-08-26 DIAGNOSIS — Z5181 Encounter for therapeutic drug level monitoring: Secondary | ICD-10-CM | POA: Diagnosis not present

## 2016-09-24 DIAGNOSIS — Z23 Encounter for immunization: Secondary | ICD-10-CM | POA: Diagnosis not present

## 2016-09-24 DIAGNOSIS — G894 Chronic pain syndrome: Secondary | ICD-10-CM | POA: Diagnosis not present

## 2016-09-24 DIAGNOSIS — Z79899 Other long term (current) drug therapy: Secondary | ICD-10-CM | POA: Diagnosis not present

## 2016-09-24 DIAGNOSIS — Z5181 Encounter for therapeutic drug level monitoring: Secondary | ICD-10-CM | POA: Diagnosis not present

## 2016-10-20 DIAGNOSIS — Z79899 Other long term (current) drug therapy: Secondary | ICD-10-CM | POA: Diagnosis not present

## 2016-10-20 DIAGNOSIS — R51 Headache: Secondary | ICD-10-CM | POA: Diagnosis not present

## 2016-10-20 DIAGNOSIS — Z951 Presence of aortocoronary bypass graft: Secondary | ICD-10-CM | POA: Diagnosis not present

## 2016-10-20 DIAGNOSIS — M436 Torticollis: Secondary | ICD-10-CM | POA: Diagnosis not present

## 2016-10-20 DIAGNOSIS — J449 Chronic obstructive pulmonary disease, unspecified: Secondary | ICD-10-CM | POA: Diagnosis not present

## 2016-10-20 DIAGNOSIS — I110 Hypertensive heart disease with heart failure: Secondary | ICD-10-CM | POA: Diagnosis not present

## 2016-10-20 DIAGNOSIS — E039 Hypothyroidism, unspecified: Secondary | ICD-10-CM | POA: Diagnosis not present

## 2016-10-20 DIAGNOSIS — Z7982 Long term (current) use of aspirin: Secondary | ICD-10-CM | POA: Diagnosis not present

## 2016-10-20 DIAGNOSIS — I509 Heart failure, unspecified: Secondary | ICD-10-CM | POA: Diagnosis not present

## 2016-10-20 DIAGNOSIS — Z87891 Personal history of nicotine dependence: Secondary | ICD-10-CM | POA: Diagnosis not present

## 2016-10-21 DIAGNOSIS — Z5181 Encounter for therapeutic drug level monitoring: Secondary | ICD-10-CM | POA: Diagnosis not present

## 2016-10-21 DIAGNOSIS — G894 Chronic pain syndrome: Secondary | ICD-10-CM | POA: Diagnosis not present

## 2016-10-21 DIAGNOSIS — Z79899 Other long term (current) drug therapy: Secondary | ICD-10-CM | POA: Diagnosis not present

## 2017-01-21 DIAGNOSIS — M7989 Other specified soft tissue disorders: Secondary | ICD-10-CM | POA: Diagnosis not present

## 2017-01-21 DIAGNOSIS — G894 Chronic pain syndrome: Secondary | ICD-10-CM | POA: Diagnosis not present

## 2017-01-21 DIAGNOSIS — M79672 Pain in left foot: Secondary | ICD-10-CM | POA: Diagnosis not present

## 2017-01-21 DIAGNOSIS — Z79899 Other long term (current) drug therapy: Secondary | ICD-10-CM | POA: Diagnosis not present

## 2017-01-21 DIAGNOSIS — Z5181 Encounter for therapeutic drug level monitoring: Secondary | ICD-10-CM | POA: Diagnosis not present

## 2017-02-04 NOTE — Progress Notes (Signed)
Cardiology Office Note   Date:  02/06/2017   ID:  Judy WYMA, DOB 04-Jul-1941, MRN [id number]  PCP:  [name], MD  Cardiologist:   Dorris Carnes, MD    F/U of CAD   History of Present Illness: [name] is a 76 y.o. female with a history of CAD (s/p CABG 2004 and DES to [place]. Last LHT 11/12 showed instent restenosis to the LM. LCX was occluded in prox segment. SVG to OM was patient as well as LAD  RCA had signif disease but appeared nondominant  The patient was admitted to [facility] in July 2013 with CP [facility] showed localized ischemia in the inferior wall (base, mid) Ranexa was added. Did not tolerate metoprolol Norvasc added.  She also has a hsitory of recurrent DVT (on chronic coumadin), HTN, COPD, HL and carotid stenosis.    I saw the pt in May 2017 Since seen she says she is more short of breath  Tires easier  Not sleeping well    Current Meds  Medication Sig  . ALPRAZolam (XANAX) 0.5 MG tablet Take 0.5 mg by mouth at bedtime.   Marland Kitchen atorvastatin (LIPITOR) 20 MG tablet TAKE 1 TABLET ONCE DAILY.  . citalopram (CELEXA) 20 MG tablet Take 60 mg by mouth daily. Takes one tablet TID  . cyclobenzaprine (FLEXERIL) 10 MG tablet Take 10 mg by mouth 3 (three) times daily as needed.   . ergocalciferol (VITAMIN D2) 50000 UNITS capsule Take 50,000 Units by mouth every Thursday.   . furosemide (LASIX) 80 MG tablet Take 80 mg by mouth 2 (two) times daily.   . isosorbide mononitrate (IMDUR) 60 MG 24 hr tablet TAKE 1 TABLET AT BEDTIME.  Marland Kitchen levothyroxine (SYNTHROID, LEVOTHROID) 137 MCG tablet Take 137 mcg by mouth daily before breakfast.   . lisinopril (PRINIVIL,ZESTRIL) 20 MG tablet Take 20-40 mg by mouth daily. Pt. Reports that she takes her BP & if its elevated she takes 40 mg. Instead of 20mg .  . nitroGLYCERIN (NITROSTAT) 0.4 MG SL tablet Place 1 tablet (0.4 mg total) under the tongue every 5 (five) minutes as needed for chest pain. For chest pain  .  omeprazole (PRILOSEC) 40 MG capsule Take 40 mg by mouth 2 (two) times daily.   Marland Kitchen oxyCODONE-acetaminophen (PERCOCET) 10-325 MG per tablet Take 1 tablet by mouth every 6 (six) hours as needed for pain.   . pilocarpine (PILOCAR) 4 % ophthalmic solution Place 1 drop into both eyes 2 (two) times daily.  . potassium chloride (KLOR-CON) 20 MEQ packet Take 20 mEq by mouth daily.  . pramipexole (MIRAPEX) 0.25 MG tablet Take 0.25 mg by mouth at bedtime.   . Rivaroxaban (XARELTO) 20 MG TABS Take 20 mg by mouth daily with supper.   . roflumilast (DALIRESP) 500 MCG TABS tablet Take 500 mcg by mouth at bedtime.   Marland Kitchen tiotropium (SPIRIVA) 18 MCG inhalation capsule Place 18 mcg into inhaler and inhale daily.  . traZODone (DESYREL) 50 MG tablet Take 150 mg by mouth at bedtime.   Marland Kitchen VITAMIN D, ERGOCALCIFEROL, PO Take 1 tablet by mouth daily.     Allergies:   Zolpidem tartrate and Penicillins   Past Medical History:  Diagnosis Date  . Anemia    bld. transfusion post lumbar surgery- 2012  . Arthralgia    NOS  . Blood transfusion 2011   WITH BACK SURGERY  . CAD (coronary artery disease)    s/p CABG 2004; s/p DES to LM in  2010;  [place] 10/29/11: EF 50-55%, mild elevated filling pressures, no pulmonary HTN, LM 90% ISR, LAD and CFX occluded, S-RI occluded (old), S-OM3 ok and L-LAD ok, native nondominant RCA 95% -  med rx recommended ; Lexiscan Myoview 7/13 at [facility]: demonstrated "normal LV function, anterior attenuation and localized ischemia, inferior, basilar, mid section"  . Carotid artery occlusion    moderate  . Chronic diastolic heart failure (HCC)    Echo 9/10: EF Q000111Q, grade 1 diastolic dysfunction  . COPD (chronic obstructive pulmonary disease) ([place])    Emphysema dxed by Dr. Woody Seller in [place] based on PFTs per pt in 2006; placed on albuterol  . Depression    TAKES CELEXA  AND  (OFF- WELBUTRIN)  . DVT of lower extremity (deep venous thrombosis) (HCC)    recurrent. bilateral (2 episodes)  .  Exertional angina (HCC)    Treated with Isosorbide, Ranexa, amlodipine; intolerant to metoprolol  . GERD (gastroesophageal reflux disease)   . HLD (hyperlipidemia)   . Hypertension   . Hyperthyroidism   . Obesity (BMI 30-39.9) 2009   BMI 33  . Osteoarthritis   . PVD (peripheral vascular disease) ([place])   . Sleep apnea 2012   USED CPAP THEN  STARTED USING SPIRIVA , Nov. 2013- last evaluation , changed from mask to aparatus that is just for her nose.Marland Kitchen     Past Surgical History:  Procedure Laterality Date  . ANKLE SURGERY  2004  . [place]   lower; another scheduled, opt. reports 4 back- lumbar, 3 cerv. fusions  for later 2009  . BREAST ENHANCEMENT SURGERY    . CARDIAC CATHETERIZATION  11/2009   Patent LIMA to LAD and patent SVG to OM1. Occluded SVG to ramus and diagonal. Left main: 90% ostial stenosis, LCX 60-70% proximal stenosis  . CORONARY ANGIOPLASTY WITH STENT PLACEMENT  11/2009   Drug eluting stent to left main artery: 4.0 X 12 mm Ion   . CORONARY ARTERY BYPASS GRAFT  2004  . EYE SURGERY  2006   BILATERAL CAT EXT.   Marland Kitchen GROIN MASS OPEN BIOPSY  2004  . JOINT REPLACEMENT  2012    BILATERAL KNEES  . JOINT REPLACEMENT  2010    RIGHT HIP REPLACEMENT  . NECK SURGERY  12/08   Due for repeat neck surgery  . REVERSE SHOULDER ARTHROPLASTY  02/26/2012   Procedure: REVERSE SHOULDER ARTHROPLASTY;  Surgeon: Marin Shutter, MD;  Location: [place];  Service: Orthopedics;  Laterality: Right;  RIGHT SHOULDER REVERSED ARTHROPLASTY  . SHOULDER ARTHROSCOPY WITH SUBACROMIAL DECOMPRESSION Left 02/10/2013   Procedure: SHOULDER ARTHROSCOPY WITH SUBACROMIAL DECOMPRESSION DISTAL CLAVICLE RESECTION;  Surgeon: Marin Shutter, MD;  Location: [place];  Service: Orthopedics;  Laterality: Left;  DISTAL CLAVICLE RESECTION  . TOTAL HIP ARTHROPLASTY  2007   Right     Social History:  The patient  reports that she quit smoking about 14 years ago. She quit after 45.00 years of use. She has never used  smokeless tobacco. She reports that she does not drink alcohol or use drugs.   Family History:  The patient's family history includes Lung cancer in her father; Ovarian cancer in her mother.    ROS:  Please see the history of present illness. All other systems are reviewed and  Negative to the above problem except as noted.    PHYSICAL EXAM: VS:  BP 122/68   Pulse (!) 106   Ht 5\' 7"  (1.702 m)   Wt 171 lb  9.6 oz (77.8 kg)   SpO2 99%   BMI 26.88 kg/m   GEN: Well nourished, well developed, in no acute distress  HEENT: normal  Neck: no JVD, carotid bruits, or masses Cardiac: RRR; Gr I/VI systolic murmur base   rubs, or gallops, 2+ edema  Respiratory:  clear to auscultation bilaterally, normal work of breathing GI: soft, nontender, nondistended, + BS  No hepatomegaly  MS: no deformity Moving all extremities   Skin: warm and dry, no rash Neuro:  Strength and sensation are intact Psych: euthymic mood, full affect   EKG:  EKG is not  ordered today.   Lipid Panel    Component Value Date/Time   CHOL 98 05/23/2014 0857   TRIG 83 05/23/2014 0857   HDL 35 (L) 05/23/2014 0857   CHOLHDL 4 03/03/2013 1136   VLDL 24.4 03/03/2013 1136   LDLCALC 46 05/23/2014 0857      Wt Readings from Last 3 Encounters:  02/06/17 171 lb 9.6 oz (77.8 kg)  04/24/16 171 lb 1.9 oz (77.6 kg)  04/02/16 173 lb 6.4 oz (78.7 kg)      ASSESSMENT AND PLAN: 1  SOB  Pt desats to 89% withwalking  ON exam, some volume increase though does not appear to be bad. Will check labs today (CBC, BNP, BMET, TSH, LIPid and d dimer)  2  CAD Concerning with SOB  Will check labs  If unremarkable need to consider eval  3  HL Check lipdis    4  Hypothyroidism  Check TSH  Continue synthroid    Current medicines are reviewed at length with the patient today.  The patient does not have concerns regarding medicines.  Signed, Dorris Carnes, MD  02/06/2017 3:40 PM    [facility] [address],  [place], [place]  [address] Phone: [phone]; Fax: [phone]

## 2017-02-06 ENCOUNTER — Encounter: Payer: Self-pay | Admitting: Internal Medicine

## 2017-02-06 ENCOUNTER — Ambulatory Visit (INDEPENDENT_AMBULATORY_CARE_PROVIDER_SITE_OTHER): Payer: Medicare Other | Admitting: Internal Medicine

## 2017-02-06 VITALS — BP 122/68 | HR 106 | Ht 67.0 in | Wt 171.6 lb

## 2017-02-06 DIAGNOSIS — R0902 Hypoxemia: Secondary | ICD-10-CM

## 2017-02-06 DIAGNOSIS — R0602 Shortness of breath: Secondary | ICD-10-CM

## 2017-02-06 DIAGNOSIS — R609 Edema, unspecified: Secondary | ICD-10-CM

## 2017-02-06 DIAGNOSIS — E785 Hyperlipidemia, unspecified: Secondary | ICD-10-CM | POA: Diagnosis not present

## 2017-02-06 DIAGNOSIS — I25810 Atherosclerosis of coronary artery bypass graft(s) without angina pectoris: Secondary | ICD-10-CM

## 2017-02-06 LAB — D-DIMER, QUANTITATIVE (NOT AT ARMC): D-DIMER: 0.64 mg/L FEU — ABNORMAL HIGH (ref 0.00–0.49)

## 2017-02-06 NOTE — Patient Instructions (Signed)
Your physician recommends that you continue on your current medications as directed. Please refer to the Current Medication list given to you today.  Your physician recommends that you return for lab work TODAY (cbc, tsh, bmet, bnp, lipids, d dimer)

## 2017-02-07 LAB — LIPID PANEL
Chol/HDL Ratio: 2 ratio units (ref 0.0–4.4)
Cholesterol, Total: 103 mg/dL (ref 100–199)
HDL: 51 mg/dL (ref >39)
LDL Calculated: 38 mg/dL (ref 0–99)
Triglycerides: 68 mg/dL (ref 0–149)
VLDL Cholesterol Cal: 14 mg/dL (ref 5–40)

## 2017-02-07 LAB — BASIC METABOLIC PANEL
BUN/Creatinine Ratio: 14 (ref 12–28)
BUN: 14 mg/dL (ref 8–27)
CO2: 27 mmol/L (ref 18–29)
Calcium: 9.3 mg/dL (ref 8.7–10.3)
Chloride: 93 mmol/L — ABNORMAL LOW (ref 96–106)
Creatinine, Ser: 0.99 mg/dL (ref 0.57–1.00)
GFR calc Af Amer: 64 mL/min/{1.73_m2} (ref >59)
GFR calc non Af Amer: 56 mL/min/{1.73_m2} — ABNORMAL LOW (ref >59)
Glucose: 90 mg/dL (ref 65–99)
Potassium: 3.2 mmol/L — ABNORMAL LOW (ref 3.5–5.2)
Sodium: 140 mmol/L (ref 134–144)

## 2017-02-07 LAB — CBC
Hematocrit: 24.8 % — ABNORMAL LOW (ref 34.0–46.6)
Hemoglobin: 7.7 g/dL — ABNORMAL LOW (ref 11.1–15.9)
MCH: 22.7 pg — ABNORMAL LOW (ref 26.6–33.0)
MCHC: 31 g/dL — ABNORMAL LOW (ref 31.5–35.7)
MCV: 73 fL — ABNORMAL LOW (ref 79–97)
Platelets: 323 10*3/uL (ref 150–379)
RBC: 3.39 x10E6/uL — ABNORMAL LOW (ref 3.77–5.28)
RDW: 16 % — ABNORMAL HIGH (ref 12.3–15.4)
WBC: 11.6 10*3/uL — ABNORMAL HIGH (ref 3.4–10.8)

## 2017-02-07 LAB — PRO B NATRIURETIC PEPTIDE: NT-Pro BNP: 508 pg/mL (ref 0–738)

## 2017-02-07 LAB — TSH: TSH: 8.04 u[IU]/mL — ABNORMAL HIGH (ref 0.450–4.500)

## 2017-02-12 ENCOUNTER — Other Ambulatory Visit: Payer: Self-pay | Admitting: *Deleted

## 2017-02-13 ENCOUNTER — Emergency Department (HOSPITAL_COMMUNITY): Payer: Medicare Other

## 2017-02-13 ENCOUNTER — Inpatient Hospital Stay (HOSPITAL_COMMUNITY)
Admission: EM | Admit: 2017-02-13 | Discharge: 2017-02-18 | DRG: 378 | Disposition: A | Discharge status: Home / Self Care | Payer: Medicare Other | Attending: Internal Medicine | Admitting: Internal Medicine

## 2017-02-13 ENCOUNTER — Encounter (HOSPITAL_COMMUNITY): Payer: Self-pay | Admitting: Emergency Medicine

## 2017-02-13 DIAGNOSIS — E440 Moderate protein-calorie malnutrition: Secondary | ICD-10-CM | POA: Diagnosis present

## 2017-02-13 DIAGNOSIS — F329 Major depressive disorder, single episode, unspecified: Secondary | ICD-10-CM | POA: Diagnosis present

## 2017-02-13 DIAGNOSIS — K254 Chronic or unspecified gastric ulcer with hemorrhage: Secondary | ICD-10-CM | POA: Diagnosis present

## 2017-02-13 DIAGNOSIS — Z880 Allergy status to penicillin: Secondary | ICD-10-CM

## 2017-02-13 DIAGNOSIS — Z9882 Breast implant status: Secondary | ICD-10-CM

## 2017-02-13 DIAGNOSIS — J449 Chronic obstructive pulmonary disease, unspecified: Secondary | ICD-10-CM | POA: Diagnosis not present

## 2017-02-13 DIAGNOSIS — Z96653 Presence of artificial knee joint, bilateral: Secondary | ICD-10-CM | POA: Diagnosis present

## 2017-02-13 DIAGNOSIS — E43 Unspecified severe protein-calorie malnutrition: Secondary | ICD-10-CM | POA: Insufficient documentation

## 2017-02-13 DIAGNOSIS — I110 Hypertensive heart disease with heart failure: Secondary | ICD-10-CM | POA: Diagnosis present

## 2017-02-13 DIAGNOSIS — I25119 Atherosclerotic heart disease of native coronary artery with unspecified angina pectoris: Secondary | ICD-10-CM | POA: Diagnosis present

## 2017-02-13 DIAGNOSIS — I739 Peripheral vascular disease, unspecified: Secondary | ICD-10-CM | POA: Diagnosis present

## 2017-02-13 DIAGNOSIS — K921 Melena: Secondary | ICD-10-CM

## 2017-02-13 DIAGNOSIS — K922 Gastrointestinal hemorrhage, unspecified: Secondary | ICD-10-CM

## 2017-02-13 DIAGNOSIS — I2510 Atherosclerotic heart disease of native coronary artery without angina pectoris: Secondary | ICD-10-CM | POA: Diagnosis present

## 2017-02-13 DIAGNOSIS — K644 Residual hemorrhoidal skin tags: Secondary | ICD-10-CM | POA: Diagnosis present

## 2017-02-13 DIAGNOSIS — K5520 Angiodysplasia of colon without hemorrhage: Secondary | ICD-10-CM | POA: Diagnosis present

## 2017-02-13 DIAGNOSIS — Z1212 Encounter for screening for malignant neoplasm of rectum: Secondary | ICD-10-CM | POA: Diagnosis not present

## 2017-02-13 DIAGNOSIS — Z87891 Personal history of nicotine dependence: Secondary | ICD-10-CM

## 2017-02-13 DIAGNOSIS — Z6826 Body mass index (BMI) 26.0-26.9, adult: Secondary | ICD-10-CM

## 2017-02-13 DIAGNOSIS — R609 Edema, unspecified: Secondary | ICD-10-CM

## 2017-02-13 DIAGNOSIS — E039 Hypothyroidism, unspecified: Secondary | ICD-10-CM | POA: Diagnosis present

## 2017-02-13 DIAGNOSIS — I5032 Chronic diastolic (congestive) heart failure: Secondary | ICD-10-CM | POA: Diagnosis not present

## 2017-02-13 DIAGNOSIS — I25810 Atherosclerosis of coronary artery bypass graft(s) without angina pectoris: Secondary | ICD-10-CM

## 2017-02-13 DIAGNOSIS — R079 Chest pain, unspecified: Secondary | ICD-10-CM | POA: Diagnosis present

## 2017-02-13 DIAGNOSIS — G4730 Sleep apnea, unspecified: Secondary | ICD-10-CM | POA: Diagnosis present

## 2017-02-13 DIAGNOSIS — K621 Rectal polyp: Secondary | ICD-10-CM | POA: Diagnosis present

## 2017-02-13 DIAGNOSIS — M1990 Unspecified osteoarthritis, unspecified site: Secondary | ICD-10-CM | POA: Diagnosis present

## 2017-02-13 DIAGNOSIS — D500 Iron deficiency anemia secondary to blood loss (chronic): Secondary | ICD-10-CM

## 2017-02-13 DIAGNOSIS — E785 Hyperlipidemia, unspecified: Secondary | ICD-10-CM | POA: Diagnosis present

## 2017-02-13 DIAGNOSIS — K2970 Gastritis, unspecified, without bleeding: Secondary | ICD-10-CM | POA: Diagnosis present

## 2017-02-13 DIAGNOSIS — R195 Other fecal abnormalities: Secondary | ICD-10-CM

## 2017-02-13 DIAGNOSIS — D62 Acute posthemorrhagic anemia: Secondary | ICD-10-CM | POA: Diagnosis present

## 2017-02-13 DIAGNOSIS — Z888 Allergy status to other drugs, medicaments and biological substances status: Secondary | ICD-10-CM

## 2017-02-13 DIAGNOSIS — Z96611 Presence of right artificial shoulder joint: Secondary | ICD-10-CM | POA: Diagnosis present

## 2017-02-13 DIAGNOSIS — Z955 Presence of coronary angioplasty implant and graft: Secondary | ICD-10-CM

## 2017-02-13 DIAGNOSIS — K222 Esophageal obstruction: Secondary | ICD-10-CM | POA: Diagnosis not present

## 2017-02-13 DIAGNOSIS — I10 Essential (primary) hypertension: Secondary | ICD-10-CM | POA: Diagnosis present

## 2017-02-13 DIAGNOSIS — D649 Anemia, unspecified: Secondary | ICD-10-CM

## 2017-02-13 DIAGNOSIS — Z96641 Presence of right artificial hip joint: Secondary | ICD-10-CM | POA: Diagnosis present

## 2017-02-13 DIAGNOSIS — E876 Hypokalemia: Secondary | ICD-10-CM | POA: Diagnosis present

## 2017-02-13 DIAGNOSIS — Z951 Presence of aortocoronary bypass graft: Secondary | ICD-10-CM

## 2017-02-13 DIAGNOSIS — K5733 Diverticulitis of large intestine without perforation or abscess with bleeding: Principal | ICD-10-CM | POA: Diagnosis present

## 2017-02-13 DIAGNOSIS — K219 Gastro-esophageal reflux disease without esophagitis: Secondary | ICD-10-CM | POA: Diagnosis present

## 2017-02-13 DIAGNOSIS — I82409 Acute embolism and thrombosis of unspecified deep veins of unspecified lower extremity: Secondary | ICD-10-CM

## 2017-02-13 DIAGNOSIS — K319 Disease of stomach and duodenum, unspecified: Secondary | ICD-10-CM | POA: Diagnosis present

## 2017-02-13 LAB — BASIC METABOLIC PANEL
Anion gap: 14 (ref 5–15)
BUN: 24 mg/dL — ABNORMAL HIGH (ref 6–20)
CO2: 29 mmol/L (ref 22–32)
Calcium: 8.9 mg/dL (ref 8.9–10.3)
Chloride: 98 mmol/L — ABNORMAL LOW (ref 101–111)
Creatinine, Ser: 0.89 mg/dL (ref 0.44–1.00)
GFR calc Af Amer: >60 mL/min (ref >60)
GFR calc non Af Amer: >60 mL/min (ref >60)
Glucose, Bld: 157 mg/dL — ABNORMAL HIGH (ref 65–99)
Potassium: 2.8 mmol/L — ABNORMAL LOW (ref 3.5–5.1)
Sodium: 141 mmol/L (ref 135–145)

## 2017-02-13 LAB — POC OCCULT BLOOD, ED: Fecal Occult Bld: POSITIVE — AB

## 2017-02-13 LAB — CBC
HCT: 21.4 % — ABNORMAL LOW (ref 36.0–46.0)
Hemoglobin: 6.4 g/dL — CL (ref 12.0–15.0)
MCH: 22.7 pg — ABNORMAL LOW (ref 26.0–34.0)
MCHC: 29.9 g/dL — ABNORMAL LOW (ref 30.0–36.0)
MCV: 75.9 fL — ABNORMAL LOW (ref 78.0–100.0)
Platelets: 288 10*3/uL (ref 150–400)
RBC: 2.82 MIL/uL — ABNORMAL LOW (ref 3.87–5.11)
RDW: 17.6 % — ABNORMAL HIGH (ref 11.5–15.5)
WBC: 11.6 10*3/uL — ABNORMAL HIGH (ref 4.0–10.5)

## 2017-02-13 LAB — MRSA PCR SCREENING: MRSA by PCR: NEGATIVE

## 2017-02-13 LAB — HEPATIC FUNCTION PANEL
ALT: 9 U/L — ABNORMAL LOW (ref 14–54)
AST: 18 U/L (ref 15–41)
Albumin: 3 g/dL — ABNORMAL LOW (ref 3.5–5.0)
Alkaline Phosphatase: 68 U/L (ref 38–126)
Bilirubin, Direct: 0.1 mg/dL (ref 0.1–0.5)
Indirect Bilirubin: 0.6 mg/dL (ref 0.3–0.9)
Total Bilirubin: 0.7 mg/dL (ref 0.3–1.2)
Total Protein: 6.1 g/dL — ABNORMAL LOW (ref 6.5–8.1)

## 2017-02-13 LAB — I-STAT TROPONIN, ED: Troponin i, poc: 0 ng/mL (ref 0.00–0.08)

## 2017-02-13 LAB — BRAIN NATRIURETIC PEPTIDE: B Natriuretic Peptide: 180.6 pg/mL — ABNORMAL HIGH (ref 0.0–100.0)

## 2017-02-13 LAB — PROTIME-INR
INR: 1.16
Prothrombin Time: 14.9 seconds (ref 11.4–15.2)

## 2017-02-13 LAB — PREPARE RBC (CROSSMATCH)

## 2017-02-13 MED ORDER — LISINOPRIL 10 MG PO TABS
20.0000 mg | ORAL_TABLET | Freq: Every day | ORAL | Status: DC
  Administered 2017-02-14 – 2017-02-17 (×4): 20 mg via ORAL
  Filled 2017-02-13 (×5): qty 2

## 2017-02-13 MED ORDER — SODIUM CHLORIDE 0.9 % IV SOLN
Freq: Once | INTRAVENOUS | Status: AC
  Administered 2017-02-13: 12:00:00 via INTRAVENOUS

## 2017-02-13 MED ORDER — OXYCODONE HCL 5 MG PO TABS
10.0000 mg | ORAL_TABLET | Freq: Four times a day (QID) | ORAL | Status: DC | PRN
  Administered 2017-02-13 – 2017-02-18 (×11): 10 mg via ORAL
  Filled 2017-02-13 (×12): qty 2

## 2017-02-13 MED ORDER — ENSURE ENLIVE PO LIQD
237.0000 mL | Freq: Two times a day (BID) | ORAL | Status: DC
  Administered 2017-02-14 – 2017-02-16 (×3): 237 mL via ORAL

## 2017-02-13 MED ORDER — ONDANSETRON HCL 4 MG PO TABS: 4.0000 mg | ORAL_TABLET | Freq: Four times a day (QID) | ORAL | Status: DC | PRN

## 2017-02-13 MED ORDER — POTASSIUM CHLORIDE CRYS ER 20 MEQ PO TBCR
40.0000 meq | EXTENDED_RELEASE_TABLET | Freq: Once | ORAL | Status: AC
  Administered 2017-02-13: 40 meq via ORAL
  Filled 2017-02-13: qty 2

## 2017-02-13 MED ORDER — NITROGLYCERIN 0.4 MG SL SUBL: 0.4000 mg | SUBLINGUAL_TABLET | SUBLINGUAL | Status: DC | PRN

## 2017-02-13 MED ORDER — ATORVASTATIN CALCIUM 20 MG PO TABS
20.0000 mg | ORAL_TABLET | Freq: Every day | ORAL | Status: DC
  Administered 2017-02-13 – 2017-02-18 (×6): 20 mg via ORAL
  Filled 2017-02-13 (×6): qty 1

## 2017-02-13 MED ORDER — PANTOPRAZOLE SODIUM 40 MG PO TBEC
40.0000 mg | DELAYED_RELEASE_TABLET | Freq: Two times a day (BID) | ORAL | Status: DC
  Administered 2017-02-13 – 2017-02-14 (×3): 40 mg via ORAL
  Filled 2017-02-13 (×3): qty 1

## 2017-02-13 MED ORDER — PILOCARPINE HCL 1 % OP SOLN
1.0000 [drp] | Freq: Two times a day (BID) | OPHTHALMIC | Status: DC
  Administered 2017-02-13 – 2017-02-18 (×10): 1 [drp] via OPHTHALMIC
  Filled 2017-02-13: qty 15

## 2017-02-13 MED ORDER — ACETAMINOPHEN 325 MG PO TABS
650.0000 mg | ORAL_TABLET | Freq: Four times a day (QID) | ORAL | Status: DC | PRN
  Administered 2017-02-15: 650 mg via ORAL
  Filled 2017-02-13: qty 2

## 2017-02-13 MED ORDER — CITALOPRAM HYDROBROMIDE 20 MG PO TABS
20.0000 mg | ORAL_TABLET | Freq: Three times a day (TID) | ORAL | Status: DC
Start: 2017-02-13 — End: 2017-02-18
  Administered 2017-02-13 – 2017-02-18 (×15): 20 mg via ORAL
  Filled 2017-02-13 (×2): qty 1
  Filled 2017-02-13: qty 2
  Filled 2017-02-13: qty 1
  Filled 2017-02-13: qty 2
  Filled 2017-02-13 (×3): qty 1
  Filled 2017-02-13 (×2): qty 2
  Filled 2017-02-13 (×6): qty 1

## 2017-02-13 MED ORDER — LEVOTHYROXINE SODIUM 137 MCG PO TABS: 137.0000 ug | ORAL_TABLET | Freq: Every day | ORAL | Status: DC

## 2017-02-13 MED ORDER — POLYETHYLENE GLYCOL 3350 17 G PO PACK: 17.0000 g | PACK | Freq: Every day | ORAL | Status: DC | PRN

## 2017-02-13 MED ORDER — UMECLIDINIUM BROMIDE 62.5 MCG/INH IN AEPB
1.0000 | INHALATION_SPRAY | Freq: Two times a day (BID) | RESPIRATORY_TRACT | Status: DC
  Administered 2017-02-14: 1 via RESPIRATORY_TRACT
  Filled 2017-02-13: qty 7

## 2017-02-13 MED ORDER — ONDANSETRON HCL 4 MG/2ML IJ SOLN: 4.0000 mg | Freq: Four times a day (QID) | INTRAMUSCULAR | Status: DC | PRN

## 2017-02-13 MED ORDER — ISOSORBIDE MONONITRATE ER 60 MG PO TB24
60.0000 mg | ORAL_TABLET | Freq: Every day | ORAL | Status: DC
  Administered 2017-02-13 – 2017-02-17 (×5): 60 mg via ORAL
  Filled 2017-02-13 (×2): qty 1
  Filled 2017-02-13: qty 2
  Filled 2017-02-13 (×2): qty 1

## 2017-02-13 MED ORDER — ROFLUMILAST 500 MCG PO TABS
500.0000 ug | ORAL_TABLET | Freq: Every day | ORAL | Status: DC
  Administered 2017-02-13 – 2017-02-17 (×5): 500 ug via ORAL
  Filled 2017-02-13 (×5): qty 1

## 2017-02-13 MED ORDER — ACETAMINOPHEN 650 MG RE SUPP: 650.0000 mg | Freq: Four times a day (QID) | RECTAL | Status: DC | PRN

## 2017-02-13 MED ORDER — SODIUM CHLORIDE 0.9 % IV SOLN
INTRAVENOUS | Status: DC
  Administered 2017-02-13 – 2017-02-14 (×2): via INTRAVENOUS

## 2017-02-13 MED ORDER — TRAZODONE HCL 150 MG PO TABS
150.0000 mg | ORAL_TABLET | Freq: Every day | ORAL | Status: DC
  Administered 2017-02-13 – 2017-02-17 (×5): 150 mg via ORAL
  Filled 2017-02-13 (×5): qty 1

## 2017-02-13 MED ORDER — LEVOTHYROXINE SODIUM 25 MCG PO TABS
137.0000 ug | ORAL_TABLET | Freq: Every day | ORAL | Status: DC
  Administered 2017-02-13 – 2017-02-18 (×6): 137 ug via ORAL
  Filled 2017-02-13 (×6): qty 1

## 2017-02-13 NOTE — Progress Notes (Signed)
Patient arrived on the unit from the ER, assessment completed see flowsheet, patient oriented to room and staff, placed on tele ccdm notified, bed in lowest position, call bell within reach will continue to monitor

## 2017-02-13 NOTE — ED Triage Notes (Signed)
Pt states "ive had some chest pain for three days, I went to my doctor a week ago and had some blood work done and they said I was anemic" Pt c/o sharp chest pain below left breast. Pt states shes dizzy and short of breath when she stands up.

## 2017-02-13 NOTE — ED Notes (Signed)
Pt to Xray.

## 2017-02-13 NOTE — ED Notes (Signed)
6.4 hemoglobin from lab, dr. Ellender Hose notified.

## 2017-02-13 NOTE — ED Notes (Signed)
Pt has signed consent for blood transfusion 

## 2017-02-13 NOTE — H&P (Signed)
History and Physical    [name] [id number] DOB: 30-Jun-1941 DOA: 02/13/2017  PCP: [name], MD Patient coming from: home   Chief Complaint:  Chest pain  HPI: [name] is a 76 y.o. female with medical history significant of CAD, s/p CABG,, CHF, PVD, COPD, DVT (has been on Xarelto till last week and it was discontinued d/t ongoing chest apin and plan to have an invasive procedure), HTN, hyperthyroidism, and anemia who presented to the ED per referral of her PCP. Patient has been having chest pain during the last three days and weakness. Patient had her blood work done by PCP and today received the phone call with instructions to go to the ED for evaluation d/t anemia On admission patient also c/o some dark stools and epigastric pain  ED Course: On arrival she had blood pressure 103/52, temperature 98.1, 5 and respirations 14,  SpO2 97% on RA Blood work demonstrated mild leukocytosis - 11,600, hemoglobin 6.4 g/dL and hematocrit 21.4%, hypokalemia with potassium 2.8 and mildly elevated BUN 24 with normal creatinine 0.89 Troponin was negative-0.00, but BNP was slightly elevated at 180.6 Fecal local blot was positive 2 EKG showed sinus rhythm without changes Chest x-ray didn't demonstrate any acute findings  Review of Systems: As per HPI otherwise 10 point review of systems negative.   Ambulatory Status: uses cana and walker for ambulation intermittently, lives alone  Past Medical History:  Diagnosis Date  . Anemia    bld. transfusion post lumbar surgery- 2012  . Arthralgia    NOS  . Blood transfusion 2011   WITH BACK SURGERY  . CAD (coronary artery disease)    s/p CABG 2004; s/p DES to LM in 2010;  [place] 10/29/11: EF 50-55%, mild elevated filling pressures, no pulmonary HTN, LM 90% ISR, LAD and CFX occluded, S-RI occluded (old), S-OM3 ok and L-LAD ok, native nondominant RCA 95% -  med rx recommended ; Lexiscan Myoview 7/13 at [facility]: demonstrated "normal LV  function, anterior attenuation and localized ischemia, inferior, basilar, mid section"  . Carotid artery occlusion    moderate  . Chronic diastolic heart failure (HCC)    Echo 9/10: EF Q000111Q, grade 1 diastolic dysfunction  . COPD (chronic obstructive pulmonary disease) ([place])    Emphysema dxed by Dr. Woody Seller in [place] based on PFTs per pt in 2006; placed on albuterol  . Depression    TAKES CELEXA  AND  (OFF- WELBUTRIN)  . DVT of lower extremity (deep venous thrombosis) (HCC)    recurrent. bilateral (2 episodes)  . Exertional angina (HCC)    Treated with Isosorbide, Ranexa, amlodipine; intolerant to metoprolol  . GERD (gastroesophageal reflux disease)   . HLD (hyperlipidemia)   . Hypertension   . Hyperthyroidism   . Obesity (BMI 30-39.9) 2009   BMI 33  . Osteoarthritis   . PVD (peripheral vascular disease) ([place])   . Sleep apnea 2012   USED CPAP THEN  STARTED USING SPIRIVA , Nov. 2013- last evaluation , changed from mask to aparatus that is just for her nose.Marland Kitchen     Past Surgical History:  Procedure Laterality Date  . ANKLE SURGERY  2004  . [place]   lower; another scheduled, opt. reports 4 back- lumbar, 3 cerv. fusions  for later 2009  . BREAST ENHANCEMENT SURGERY    . CARDIAC CATHETERIZATION  11/2009   Patent LIMA to LAD and patent SVG to OM1. Occluded SVG to ramus and diagonal. Left main: 90% ostial  stenosis, LCX 60-70% proximal stenosis  . CORONARY ANGIOPLASTY WITH STENT PLACEMENT  11/2009   Drug eluting stent to left main artery: 4.0 X 12 mm Ion   . CORONARY ARTERY BYPASS GRAFT  2004  . EYE SURGERY  2006   BILATERAL CAT EXT.   Marland Kitchen GROIN MASS OPEN BIOPSY  2004  . JOINT REPLACEMENT  2012    BILATERAL KNEES  . JOINT REPLACEMENT  2010    RIGHT HIP REPLACEMENT  . NECK SURGERY  12/08   Due for repeat neck surgery  . REVERSE SHOULDER ARTHROPLASTY  02/26/2012   Procedure: REVERSE SHOULDER ARTHROPLASTY;  Surgeon: Marin Shutter, MD;  Location: [place];  Service: Orthopedics;   Laterality: Right;  RIGHT SHOULDER REVERSED ARTHROPLASTY  . SHOULDER ARTHROSCOPY WITH SUBACROMIAL DECOMPRESSION Left 02/10/2013   Procedure: SHOULDER ARTHROSCOPY WITH SUBACROMIAL DECOMPRESSION DISTAL CLAVICLE RESECTION;  Surgeon: Marin Shutter, MD;  Location: [place];  Service: Orthopedics;  Laterality: Left;  DISTAL CLAVICLE RESECTION  . TOTAL HIP ARTHROPLASTY  2007   Right    Social History   Social History  . Marital status: Married    Spouse name: N/A  . Number of children: 3  . Years of education: N/A   Occupational History  . retired     Corporate treasurer  .  Retired   Social History Main Topics  . Smoking status: Former Smoker    Years: 45.00    Quit date: 12/22/2002  . Smokeless tobacco: Never Used  . Alcohol use No  . Drug use: No  . Sexual activity: No   Other Topics Concern  . Not on file   Social History Narrative   Married 72 years, lives with husband.   3 grown children.    Allergies  Allergen Reactions  . Zolpidem Tartrate Anxiety    CONFUSION   . Penicillins Rash    Family History  Problem Relation Age of Onset  . Ovarian cancer Mother   . Lung cancer Father   . Cancer    . Heart disease    . Colitis      Prior to Admission medications   Medication Sig Start Date End Date Taking? Authorizing Provider  allopurinol (ZYLOPRIM) 100 MG tablet Take 100 mg by mouth daily. 01/30/17  Yes Historical Provider, MD  atorvastatin (LIPITOR) 20 MG tablet TAKE 1 TABLET ONCE DAILY. 11/25/14  Yes Fay Records, MD  citalopram (CELEXA) 20 MG tablet Take 20 mg by mouth 3 (three) times daily.    Yes Historical Provider, MD  cyclobenzaprine (FLEXERIL) 10 MG tablet Take 10 mg by mouth 3 (three) times daily as needed for muscle spasms.    Yes Historical Provider, MD  furosemide (LASIX) 80 MG tablet Take 80 mg by mouth 2 (two) times daily.    Yes Historical Provider, MD  INCRUSE ELLIPTA 62.5 MCG/INH AEPB Inhale 1 puff into the lungs 2 (two) times daily. 11/30/16  Yes Historical  Provider, MD  isosorbide mononitrate (IMDUR) 60 MG 24 hr tablet TAKE 1 TABLET AT BEDTIME. 04/24/15  Yes Fay Records, MD  levothyroxine (SYNTHROID, LEVOTHROID) 137 MCG tablet Take 137 mcg by mouth daily before breakfast.    Yes Historical Provider, MD  lisinopril (PRINIVIL,ZESTRIL) 20 MG tablet Take 20-40 mg by mouth daily as needed. Pt. Reports that she takes her BP & if its elevated she takes 20-40 mg daily as needed   Yes Historical Provider, MD  nitroGLYCERIN (NITROSTAT) 0.4 MG SL tablet Place 1 tablet (0.4 mg  total) under the tongue every 5 (five) minutes as needed for chest pain. For chest pain 04/24/16  Yes Fay Records, MD  omeprazole (PRILOSEC) 40 MG capsule Take 40 mg by mouth 2 (two) times daily.    Yes Historical Provider, MD  Oxycodone HCl 10 MG TABS Take 10 mg by mouth every 6 (six) hours as needed for pain. 01/25/17  Yes Historical Provider, MD  pilocarpine (PILOCAR) 1 % ophthalmic solution Place 1 drop into both eyes 2 (two) times daily. 11/30/16  Yes Historical Provider, MD  pilocarpine (PILOCAR) 4 % ophthalmic solution Place 1 drop into both eyes 2 (two) times daily.   Yes Historical Provider, MD  potassium chloride (KLOR-CON) 20 MEQ packet Take 20 mEq by mouth daily.   Yes Historical Provider, MD  roflumilast (DALIRESP) 500 MCG TABS tablet Take 500 mcg by mouth at bedtime.    Yes Historical Provider, MD  traZODone (DESYREL) 50 MG tablet Take 150 mg by mouth at bedtime.    Yes Historical Provider, MD    Physical Exam: Vitals:   02/13/17 1247 02/13/17 1303 02/13/17 1315 02/13/17 1400  BP: (!) 103/52 127/56 139/71 (!) 113/51  Pulse: 94 96 97 89  Resp: 16 14 16 18   Temp: 98.3 F (36.8 C) 98.3 F (36.8 C)    TempSrc: Oral Oral    SpO2: 100% 100% 98% 92%    General: Appears calm and comfortable Eyes: PERRLA, EOMI, normal lids, iris ENT:  grossly normal hearing, lips & tongue, mucous membranes moist and intact Neck: no lymphoadenopathy, masses or thyromegaly Cardiovascular: RRR,  no m/r/g. No JVD, carotid bruits. No LE edema.  Respiratory: bilateral no wheezes, rales, rhonchi or cracles. Normal respiratory effort. No accessory muscle use observed Abdomen: soft, non-tender, non-distended, no organomegaly or masses appreciated. BS present in all quadrants Skin: no rash, ulcers or induration seen on limited exam Musculoskeletal: grossly normal tone BUE/BLE, good ROM, no bony abnormality or joint deformities observed Psychiatric: grossly normal mood and affect, speech fluent and appropriate, alert and oriented x3 Neurologic: CN II-XII grossly intact, moves all extremities in coordinated fashion, sensation intact  Labs on Admission: I have personally reviewed following labs and imaging studies  CBC, BMP  GFR: Estimated Creatinine Clearance: 57.8 mL/min (by C-G formula based on SCr of 0.89 mg/dL).   Creatinine Clearance: Estimated Creatinine Clearance: 57.8 mL/min (by C-G formula based on SCr of 0.89 mg/dL).   Radiological Exams on Admission: Dg Chest 2 View  Result Date: 02/13/2017 CLINICAL DATA:  Chest pain, dizziness for 1 week, COPD EXAM: CHEST  2 VIEW COMPARISON:  05/02/2014 FINDINGS: Cardiomediastinal silhouette is stable. Bilateral calcified breast implants again noted. Status post CABG. No infiltrate or pulmonary edema. Mild hyperinflation. Osteopenia and degenerative changes thoracic spine. Right shoulder prosthesis. Extensive degenerative changes left shoulder. IMPRESSION: No active disease. Bilateral calcified breast implants again noted. Status post CABG. Right shoulder prosthesis. Degenerative changes left shoulder. Electronically Signed   By: Lahoma Crocker M.D.   On: 02/13/2017 11:07    EKG: Independently reviewed - sinus tachycardia   Assessment/Plan Principal Problem:   GI bleed Active Problems:   Hypothyroidism   Essential hypertension   COPD (chronic obstructive pulmonary disease) (HCC)   CAD (coronary artery disease)   Dyslipidemia   Chest  pain   Blood loss anemia   GI bleeding with blood loss anemia  Patient believes that she has been taking Xaelto for progression of CAD and significant bruising on Aspirin and Plavix. She reported of not  taking Xarelto last week. She has a history of DVT x 2 and apparently is on Xarelto for secondary prevention. Might need IVC filter placement PRBCs transfusion (2 units) is in progress Continue home PPI on increased dose to twice a day  Continue to monitor H&H Patient was seen by GI in consultation Will keep patient nothing by mouth for upcoming endoscopy  Chest pain in patient with known coronary artery disease - most likely demand ischemia Regular, with blood transfusion patient reported improvement in pain Cardiac enzymes negative, EKG doesn't have any new dynamic changes  COPD Continue home inhalers  Currently stable, without signs of exacerbation.  Hypertension  Continue home medication and adjust the doses if needed depending on the BP readings  Hyperlipidemia Continue statin therapy as before  Hypothyroidism Continue home dose of synthroid  DVT prophylaxis: SCD Code Status: full Family Communication: at bedside Disposition Plan: telemetry Consults called: GI by EDP Admission status: observation   York Grice, PA-C Pager: [phone] [facility]  If 7PM-7AM, please contact night-coverage [email or web address] Password [email or web address]  02/13/2017, 2:50 PM

## 2017-02-13 NOTE — ED Provider Notes (Signed)
[facility] DEPT Provider Note   CSN: [id number] Arrival date & time: 02/13/17  1032     History   Chief Complaint Chief Complaint  Patient presents with  . Chest Pain    HPI [name] is a 76 y.o. female.  HPI   76 year old female with past medical history of diastolic heart failure, coronary artery disease, history of DVT on several toe who presents with several complaints. Patient states that for the last week, she has felt generally fatigued and has become progressively more short of breath, initially with exertion but not rest. She also has associated persistent dull, aching, substernal chest pressure. She saw her doctor last week and was noted to be anemic. She was supposed to have repeat check today but her symptoms worsen so she presents to the ER. Currently, she does feel short of breath at rest. She has not taking her Xarelto since her symptoms started. No other medical complaints.  Past Medical History:  Diagnosis Date  . Anemia    bld. transfusion post lumbar surgery- 2012  . Arthralgia    NOS  . Blood transfusion 2011   WITH BACK SURGERY  . CAD (coronary artery disease)    s/p CABG 2004; s/p DES to LM in 2010;  [place] 10/29/11: EF 50-55%, mild elevated filling pressures, no pulmonary HTN, LM 90% ISR, LAD and CFX occluded, S-RI occluded (old), S-OM3 ok and L-LAD ok, native nondominant RCA 95% -  med rx recommended ; Lexiscan Myoview 7/13 at [facility]: demonstrated "normal LV function, anterior attenuation and localized ischemia, inferior, basilar, mid section"  . Carotid artery occlusion    moderate  . Chronic diastolic heart failure (HCC)    Echo 9/10: EF [id number], grade 1 diastolic dysfunction  . COPD (chronic obstructive pulmonary disease) ([place])    Emphysema dxed by Dr. Woody Seller in [place] based on PFTs per pt in 2006; placed on albuterol  . Depression    TAKES CELEXA  AND  (OFF- WELBUTRIN)  . DVT of lower extremity (deep venous thrombosis) (HCC)    recurrent. bilateral (2 episodes)  . Exertional angina (HCC)    Treated with Isosorbide, Ranexa, amlodipine; intolerant to metoprolol  . GERD (gastroesophageal reflux disease)   . HLD (hyperlipidemia)   . Hypertension   . Hyperthyroidism   . Obesity (BMI 30-39.9) 2009   BMI 33  . Osteoarthritis   . PVD (peripheral vascular disease) ([place])   . Sleep apnea 2012   USED CPAP THEN  STARTED USING SPIRIVA , Nov. 2013- last evaluation , changed from mask to aparatus that is just for her nose..     Patient Active Problem List   Diagnosis Date Noted  . GI bleed 02/13/2017  . Blood loss anemia 02/13/2017  . Impingement syndrome of left shoulder 02/10/2013  . Chest pain 11/17/2011  . PAD (peripheral artery disease) ([place]) 11/17/2011  . Dyslipidemia 10/17/2011  . Preop cardiovascular exam 06/19/2011  . CAD (coronary artery disease)   . DEEP VENOUS THROMBOPHLEBITIS 12/27/2008  . Hypothyroidism 05/03/2008  . Essential hypertension 05/03/2008  . EMPHYSEMA 05/03/2008  . OBSTRUCTIVE SLEEP APNEA 05/03/2008  . COPD (chronic obstructive pulmonary disease) ([place]) 04/12/2008  . SHORTNESS OF BREATH (SOB) 04/12/2008  . SNORING 04/12/2008    Past Surgical History:  Procedure Laterality Date  . ANKLE SURGERY  2004  . [place]   lower; another scheduled, opt. reports 4 back- lumbar, 3 cerv. fusions  for later 2009  . BREAST ENHANCEMENT SURGERY    .  CARDIAC CATHETERIZATION  11/2009   Patent LIMA to LAD and patent SVG to OM1. Occluded SVG to ramus and diagonal. Left main: 90% ostial stenosis, LCX 60-70% proximal stenosis  . CORONARY ANGIOPLASTY WITH STENT PLACEMENT  11/2009   Drug eluting stent to left main artery: 4.0 X 12 mm Ion   . CORONARY ARTERY BYPASS GRAFT  2004  . EYE SURGERY  2006   BILATERAL CAT EXT.   Marland Kitchen GROIN MASS OPEN BIOPSY  2004  . JOINT REPLACEMENT  2012    BILATERAL KNEES  . JOINT REPLACEMENT  2010    RIGHT HIP REPLACEMENT  . NECK SURGERY  12/08   Due for repeat neck  surgery  . REVERSE SHOULDER ARTHROPLASTY  02/26/2012   Procedure: REVERSE SHOULDER ARTHROPLASTY;  Surgeon: Marin Shutter, MD;  Location: [facility];  Service: Orthopedics;  Laterality: Right;  RIGHT SHOULDER REVERSED ARTHROPLASTY  . SHOULDER ARTHROSCOPY WITH SUBACROMIAL DECOMPRESSION Left 02/10/2013   Procedure: SHOULDER ARTHROSCOPY WITH SUBACROMIAL DECOMPRESSION DISTAL CLAVICLE RESECTION;  Surgeon: Marin Shutter, MD;  Location: [facility];  Service: Orthopedics;  Laterality: Left;  DISTAL CLAVICLE RESECTION  . TOTAL HIP ARTHROPLASTY  2007   Right    OB History    No data available       Home Medications    Prior to Admission medications   Medication Sig Start Date End Date Taking? Authorizing Provider  allopurinol (ZYLOPRIM) 100 MG tablet Take 100 mg by mouth daily. 01/30/17  Yes Historical Provider, MD  atorvastatin (LIPITOR) 20 MG tablet TAKE 1 TABLET ONCE DAILY. 11/25/14  Yes Fay Records, MD  citalopram (CELEXA) 20 MG tablet Take 20 mg by mouth 3 (three) times daily.    Yes Historical Provider, MD  cyclobenzaprine (FLEXERIL) 10 MG tablet Take 10 mg by mouth 3 (three) times daily as needed for muscle spasms.    Yes Historical Provider, MD  furosemide (LASIX) 80 MG tablet Take 80 mg by mouth 2 (two) times daily.    Yes Historical Provider, MD  INCRUSE ELLIPTA 62.5 MCG/INH AEPB Inhale 1 puff into the lungs 2 (two) times daily. 11/30/16  Yes Historical Provider, MD  isosorbide mononitrate (IMDUR) 60 MG 24 hr tablet TAKE 1 TABLET AT BEDTIME. 04/24/15  Yes Fay Records, MD  levothyroxine (SYNTHROID, LEVOTHROID) 137 MCG tablet Take 137 mcg by mouth daily before breakfast.    Yes Historical Provider, MD  lisinopril (PRINIVIL,ZESTRIL) 20 MG tablet Take 20-40 mg by mouth daily as needed. Pt. Reports that she takes her BP & if its elevated she takes 20-40 mg daily as needed   Yes Historical Provider, MD  nitroGLYCERIN (NITROSTAT) 0.4 MG SL tablet Place 1 tablet (0.4 mg total) under the tongue every 5 (five)  minutes as needed for chest pain. For chest pain 04/24/16  Yes Fay Records, MD  omeprazole (PRILOSEC) 40 MG capsule Take 40 mg by mouth 2 (two) times daily.    Yes Historical Provider, MD  Oxycodone HCl 10 MG TABS Take 10 mg by mouth every 6 (six) hours as needed for pain. 01/25/17  Yes Historical Provider, MD  pilocarpine (PILOCAR) 1 % ophthalmic solution Place 1 drop into both eyes 2 (two) times daily. 11/30/16  Yes Historical Provider, MD  pilocarpine (PILOCAR) 4 % ophthalmic solution Place 1 drop into both eyes 2 (two) times daily.   Yes Historical Provider, MD  potassium chloride (KLOR-CON) 20 MEQ packet Take 20 mEq by mouth daily.   Yes Historical Provider, MD  roflumilast (  DALIRESP) 500 MCG TABS tablet Take 500 mcg by mouth at bedtime.    Yes Historical Provider, MD  traZODone (DESYREL) 50 MG tablet Take 150 mg by mouth at bedtime.    Yes Historical Provider, MD    Family History Family History  Problem Relation Age of Onset  . Ovarian cancer Mother   . Lung cancer Father   . Cancer    . Heart disease    . Colitis      Social History Social History  Substance Use Topics  . Smoking status: Former Smoker    Years: 45.00    Quit date: 12/22/2002  . Smokeless tobacco: Never Used  . Alcohol use No     Allergies   Zolpidem tartrate and Penicillins   Review of Systems Review of Systems  Constitutional: Positive for fatigue.  Respiratory: Positive for chest tightness and shortness of breath.   Gastrointestinal: Positive for nausea.  Neurological: Positive for weakness and light-headedness.  Hematological: Bruises/bleeds easily.  All other systems reviewed and are negative.    Physical Exam Updated Vital Signs BP (!) (P) 113/52   Pulse (P) 90   Temp (P) 97.9 F (36.6 C) (Oral)   Resp (P) 18   SpO2 (P) 100%   Physical Exam  Constitutional: She is oriented to person, place, and time. She appears well-developed and well-nourished. No distress.  HENT:  Head:  Normocephalic and atraumatic.  Mouth/Throat: Oropharynx is clear and moist. No oropharyngeal exudate.  Eyes: Conjunctivae are normal.  Neck: Neck supple.  Cardiovascular: Normal rate, regular rhythm and normal heart sounds.  Exam reveals no friction rub.   No murmur heard. Pulmonary/Chest: Effort normal and breath sounds normal. No respiratory distress. She has no wheezes. She has no rales.  Abdominal: She exhibits no distension.  Genitourinary:  Genitourinary Comments: Soft brown and melanotic stool in rectal vault. No active bleeding.  Musculoskeletal: She exhibits no edema.  Neurological: She is alert and oriented to person, place, and time. She exhibits normal muscle tone.  Skin: Skin is warm. Capillary refill takes less than 2 seconds.  Psychiatric: She has a normal mood and affect.  Nursing note and vitals reviewed.    ED Treatments / Results  Labs (all labs ordered are listed, but only abnormal results are displayed) Labs Reviewed  BASIC METABOLIC PANEL - Abnormal; Notable for the following:       Result Value   Potassium 2.8 (*)    Chloride 98 (*)    Glucose, Bld 157 (*)    BUN 24 (*)    All other components within normal limits  CBC - Abnormal; Notable for the following:    WBC 11.6 (*)    RBC 2.82 (*)    Hemoglobin 6.4 (*)    HCT 21.4 (*)    MCV 75.9 (*)    MCH 22.7 (*)    MCHC 29.9 (*)    RDW 17.6 (*)    All other components within normal limits  BRAIN NATRIURETIC PEPTIDE - Abnormal; Notable for the following:    B Natriuretic Peptide 180.6 (*)    All other components within normal limits  HEPATIC FUNCTION PANEL - Abnormal; Notable for the following:    Total Protein 6.1 (*)    Albumin 3.0 (*)    ALT 9 (*)    All other components within normal limits  POC OCCULT BLOOD, ED - Abnormal; Notable for the following:    Fecal Occult Bld POSITIVE (*)    All other  components within normal limits  PROTIME-INR  I-STAT TROPOININ, ED  TYPE AND SCREEN  PREPARE RBC  (CROSSMATCH)    EKG  EKG Interpretation None       Radiology Dg Chest 2 View  Result Date: 02/13/2017 CLINICAL DATA:  Chest pain, dizziness for 1 week, COPD EXAM: CHEST  2 VIEW COMPARISON:  05/02/2014 FINDINGS: Cardiomediastinal silhouette is stable. Bilateral calcified breast implants again noted. Status post CABG. No infiltrate or pulmonary edema. Mild hyperinflation. Osteopenia and degenerative changes thoracic spine. Right shoulder prosthesis. Extensive degenerative changes left shoulder. IMPRESSION: No active disease. Bilateral calcified breast implants again noted. Status post CABG. Right shoulder prosthesis. Degenerative changes left shoulder. Electronically Signed   By: Lahoma Crocker M.D.   On: 02/13/2017 11:07    Procedures .Critical Care Performed by: Duffy Bruce Authorized by: Duffy Bruce   Critical care provider statement:    Critical care time (minutes):  35   Critical care time was exclusive of:  Separately billable procedures and treating other patients   Critical care was necessary to treat or prevent imminent or life-threatening deterioration of the following conditions:  Cardiac failure and circulatory failure   Critical care was time spent personally by me on the following activities:  Blood draw for specimens, development of treatment plan with patient or surrogate, discussions with consultants, evaluation of patient's response to treatment, examination of patient, obtaining history from patient or surrogate, ordering and performing treatments and interventions, ordering and review of laboratory studies, ordering and review of radiographic studies, pulse oximetry, re-evaluation of patient's condition and review of old charts   I assumed direction of critical care for this patient from another provider in my specialty: no      (including critical care time)  Medications Ordered in ED Medications  0.9 %  sodium chloride infusion (not administered)    pantoprazole (PROTONIX) EC tablet 40 mg (40 mg Oral Given 02/13/17 1415)  potassium chloride SA (K-DUR,KLOR-CON) CR tablet 40 mEq (not administered)  umeclidinium bromide (INCRUSE ELLIPTA) 62.5 MCG/INH 1 puff (1 puff Inhalation Not Given 02/13/17 1430)  oxyCODONE (Oxy IR/ROXICODONE) immediate release tablet 10 mg (not administered)  nitroGLYCERIN (NITROSTAT) SL tablet 0.4 mg (not administered)  isosorbide mononitrate (IMDUR) 24 hr tablet 60 mg (not administered)  atorvastatin (LIPITOR) tablet 20 mg (not administered)  roflumilast (DALIRESP) tablet 500 mcg (not administered)  traZODone (DESYREL) tablet 150 mg (not administered)  citalopram (CELEXA) tablet 20 mg (not administered)  lisinopril (PRINIVIL,ZESTRIL) tablet 20 mg (not administered)  acetaminophen (TYLENOL) tablet 650 mg (not administered)    Or  acetaminophen (TYLENOL) suppository 650 mg (not administered)  ondansetron (ZOFRAN) tablet 4 mg (not administered)    Or  ondansetron (ZOFRAN) injection 4 mg (not administered)  polyethylene glycol (MIRALAX / GLYCOLAX) packet 17 g (not administered)  pilocarpine (PILOCAR) 1 % ophthalmic solution 1 drop (not administered)  levothyroxine (SYNTHROID, LEVOTHROID) tablet 137 mcg (not administered)  0.9 %  sodium chloride infusion ( Intravenous New Bag/Given 02/13/17 1158)  potassium chloride SA (K-DUR,KLOR-CON) CR tablet 40 mEq (40 mEq Oral Given 02/13/17 1206)     Initial Impression / Assessment and Plan / ED Course  I have reviewed the triage vital signs and the nursing notes.  Pertinent labs & imaging results that were available during my care of the patient were reviewed by me and considered in my medical decision making (see chart for details).   76 year old female who presents with progressively worsening fatigue and dyspnea, initially on exertion and rest. On arrival,  vital signs are stable. EKG does show diffuse ST changes consistent with likely demand ischemia. There are no ST  elevations. Screening labwork shows marketed anemia which is microcytic, likely secondary to GI bleed. Hemoccult is positive as well. No signs of bright red blood per rectum, however. Patient's GI physician is in [place], [place]. Will consult GI here as well as admit to unassigned medicine for transfusion and further management. Patient consented and blood transfusion initiated in the emergency department given symptoms of ongoing anemia.  Final Clinical Impressions(s) / ED Diagnoses   Final diagnoses:  Gastrointestinal hemorrhage, unspecified gastrointestinal hemorrhage type  Blood loss anemia  Symptomatic anemia    New Prescriptions Current Discharge Medication List       Duffy Bruce, MD 02/13/17 1902

## 2017-02-13 NOTE — Consult Note (Addendum)
[facility] Consult: 12:26 PM 02/13/2017  LOS: 0 days    Referring Provider: Dr Ellender Hose in ED  Primary Care Physician:  [name], MD Primary Gastroenterologist:  Dr. Melina Copa in [place]     Reason for Consultation:  GI bleed: melenic stool, symptomatic anemia.     HPI: [name] is a 76 y.o. female.  PMH diastolilc heart failure.  CAD, s/p CABG 2004, DES 2010.  Hx DVT x 2.  On chronic xarelto.  COPD.  Carotid stenosis.  DJD s/p replacement of shoulder, knees, hips.  S/p spinal surgery.  Previous upper endoscopy and colonoscopy by Dr. Melina Copa. The latest procedures performed within the last 12 months and were unremarkable:  No colon polyps. Stable/asymptomatic GERD.  History diverticulitis.   For 7-10 days has had increasing DOE, fatigues easily, weakness. Presyncope when she stands up. Increased heart rate with minor activities.  Appetite is diminished. Bowel movements have been brown and she has never seen blood or melenic stool.  No nausea vomiting.  Her one GI complaint is that she is passing a lot of gas which has a particularly strong odor.  Takes Omeprazole 40 mg BID.  No NSAIDs other than occasional BCs powders, 1 x every several days to few weeks.  She was seen by [name], cardiologist, last week and was taken off Xarelto in anticipation that she may need invasive cardiac procedures. Hemoglobin was obtained and her hemoglobin was 7.7. Dr. Harrington Challenger started her on oral iron.  Plan was to have follow-up labs at her PMD office today, her symptoms had persisted and she thought she was going to meet with her doctor but it turned out it was only a lab visit. Because her symptoms have persisted, her daughter chose to take her mother directly to the emergency department and skipped the lab work this  morning. Here in the ED CXR unremarkable.     Hgb of 2/16: 7.7.  6.4 today. ~ 11 in 2014 and 2015.  MCV 73-75.  Coags normal.  TSH elevated at 8.0 last week.  BUN 14/0.9 >> 24/.89 over last seven days.     Past Medical History:  Diagnosis Date  . Anemia    bld. transfusion post lumbar surgery- 2012  . Arthralgia    NOS  . Blood transfusion 2011   WITH BACK SURGERY  . CAD (coronary artery disease)    s/p CABG 2004; s/p DES to LM in 2010;  [place] 10/29/11: EF 50-55%, mild elevated filling pressures, no pulmonary HTN, LM 90% ISR, LAD and CFX occluded, S-RI occluded (old), S-OM3 ok and L-LAD ok, native nondominant RCA 95% -  med rx recommended ; Lexiscan Myoview 7/13 at [facility]: demonstrated "normal LV function, anterior attenuation and localized ischemia, inferior, basilar, mid section"  . Carotid artery occlusion    moderate  . Chronic diastolic heart failure (HCC)    Echo 9/10: EF [id number], grade 1 diastolic dysfunction  . COPD (chronic obstructive pulmonary disease) (HCC)    Emphysema dxed by Dr. Woody Seller in [place] based on PFTs  per pt in 2006; placed on albuterol  . Depression    TAKES CELEXA  AND  (OFF- WELBUTRIN)  . DVT of lower extremity (deep venous thrombosis) (HCC)    recurrent. bilateral (2 episodes)  . Exertional angina (HCC)    Treated with Isosorbide, Ranexa, amlodipine; intolerant to metoprolol  . GERD (gastroesophageal reflux disease)   . HLD (hyperlipidemia)   . Hypertension   . Hyperthyroidism   . Obesity (BMI 30-39.9) 2009   BMI 33  . Osteoarthritis   . PVD (peripheral vascular disease) ([place])   . Sleep apnea 2012   USED CPAP THEN  STARTED USING SPIRIVA , Nov. 2013- last evaluation , changed from mask to aparatus that is just for her nose.Marland Kitchen     Past Surgical History:  Procedure Laterality Date  . ANKLE SURGERY  2004  . [place]   lower; another scheduled, opt. reports 4 back- lumbar, 3 cerv. fusions  for later 2009  . BREAST ENHANCEMENT SURGERY     . CARDIAC CATHETERIZATION  11/2009   Patent LIMA to LAD and patent SVG to OM1. Occluded SVG to ramus and diagonal. Left main: 90% ostial stenosis, LCX 60-70% proximal stenosis  . CORONARY ANGIOPLASTY WITH STENT PLACEMENT  11/2009   Drug eluting stent to left main artery: 4.0 X 12 mm Ion   . CORONARY ARTERY BYPASS GRAFT  2004  . EYE SURGERY  2006   BILATERAL CAT EXT.   Marland Kitchen GROIN MASS OPEN BIOPSY  2004  . JOINT REPLACEMENT  2012    BILATERAL KNEES  . JOINT REPLACEMENT  2010    RIGHT HIP REPLACEMENT  . NECK SURGERY  12/08   Due for repeat neck surgery  . REVERSE SHOULDER ARTHROPLASTY  02/26/2012   Procedure: REVERSE SHOULDER ARTHROPLASTY;  Surgeon: Marin Shutter, MD;  Location: [facility];  Service: Orthopedics;  Laterality: Right;  RIGHT SHOULDER REVERSED ARTHROPLASTY  . SHOULDER ARTHROSCOPY WITH SUBACROMIAL DECOMPRESSION Left 02/10/2013   Procedure: SHOULDER ARTHROSCOPY WITH SUBACROMIAL DECOMPRESSION DISTAL CLAVICLE RESECTION;  Surgeon: Marin Shutter, MD;  Location: [facility];  Service: Orthopedics;  Laterality: Left;  DISTAL CLAVICLE RESECTION  . TOTAL HIP ARTHROPLASTY  2007   Right    Prior to Admission medications   Medication Sig Start Date End Date Taking? Authorizing Provider  atorvastatin (LIPITOR) 20 MG tablet TAKE 1 TABLET ONCE DAILY. 11/25/14   Fay Records, MD  citalopram (CELEXA) 20 MG tablet Take 60 mg by mouth daily. Takes one tablet TID    Historical Provider, MD  cyclobenzaprine (FLEXERIL) 10 MG tablet Take 10 mg by mouth 3 (three) times daily as needed.     Historical Provider, MD  furosemide (LASIX) 80 MG tablet Take 80 mg by mouth 2 (two) times daily.     Historical Provider, MD  isosorbide mononitrate (IMDUR) 60 MG 24 hr tablet TAKE 1 TABLET AT BEDTIME. 04/24/15   Fay Records, MD  levothyroxine (SYNTHROID, LEVOTHROID) 137 MCG tablet Take 137 mcg by mouth daily before breakfast.     Historical Provider, MD  lisinopril (PRINIVIL,ZESTRIL) 20 MG tablet Take 20-40 mg by mouth daily.  Pt. Reports that she takes her BP & if its elevated she takes 40 mg. Instead of 20mg .    Historical Provider, MD  nitroGLYCERIN (NITROSTAT) 0.4 MG SL tablet Place 1 tablet (0.4 mg total) under the tongue every 5 (five) minutes as needed for chest pain. For chest pain 04/24/16   Fay Records, MD  omeprazole [facility])  40 MG capsule Take 40 mg by mouth 2 (two) times daily.     Historical Provider, MD  oxyCODONE-acetaminophen (PERCOCET) 10-325 MG per tablet Take 1 tablet by mouth every 6 (six) hours as needed for pain.     Historical Provider, MD  pilocarpine (PILOCAR) 4 % ophthalmic solution Place 1 drop into both eyes 2 (two) times daily.    Historical Provider, MD  potassium chloride (KLOR-CON) 20 MEQ packet Take 20 mEq by mouth daily.    Historical Provider, MD  pramipexole (MIRAPEX) 0.25 MG tablet Take 0.25 mg by mouth at bedtime.     Historical Provider, MD  roflumilast (DALIRESP) 500 MCG TABS tablet Take 500 mcg by mouth at bedtime.     Historical Provider, MD  tiotropium (SPIRIVA) 18 MCG inhalation capsule Place 18 mcg into inhaler and inhale daily.    Historical Provider, MD  traZODone (DESYREL) 50 MG tablet Take 150 mg by mouth at bedtime.     Historical Provider, MD  VITAMIN D, ERGOCALCIFEROL, PO Take 1 tablet by mouth daily.    Historical Provider, MD    Scheduled Meds:  Infusions:  PRN Meds:    Allergies as of 02/13/2017 - Review Complete 02/13/2017  Allergen Reaction Noted  . Zolpidem tartrate Anxiety 02/23/2012  . Penicillins Rash 04/12/2008    Family History  Problem Relation Age of Onset  . Ovarian cancer Mother   . Lung cancer Father   . Cancer    . Heart disease    . Colitis      Social History   Social History  . Marital status: Married    Spouse name: N/A  . Number of children: 3  . Years of education: N/A   Occupational History  . retired     Corporate treasurer  .  Retired   Social History Main Topics  . Smoking status: Former Smoker    Years: 45.00     Quit date: 12/22/2002  . Smokeless tobacco: Never Used  . Alcohol use No  . Drug use: No  . Sexual activity: No   Other Topics Concern  . Not on file   Social History Narrative   Married 68 years, lives with husband.   3 grown children.    REVIEW OF SYSTEMS: Constitutional:  Per HPI ENT:  No nose bleeds Pulm:  Per HPI.  No cough, no pleuritic pain CV:  Tachy with exertion.  No CP GU:  No hematuria, no frequency GI:  Per HPI Heme:  No unusual bleeding or bruising.   Transfusions:  The only transfusion she recalls were at the time of her coronary bypass surgery.  A few years ago she was put on iron but eventually it was discontinued. Oral iron was just recently started by Dr. Harrington Challenger last week. Neuro:  Per HPI MS:  Describes her left shoulder replacement as "failed" and range of motion is very limited there Derm:  No itching, no rash or sores.  Endocrine:  No sweats or chills.  No polyuria or dysuria Immunization:  Not queried.   Travel:  None beyond local counties in last few months.    PHYSICAL EXAM: Vital signs in last 24 hours: Vitals:   02/13/17 1130 02/13/17 1200  BP: (!) 116/51 (!) 117/53  Pulse: 94 85  Resp: (!) 27 18  Temp:     Wt Readings from Last 3 Encounters:  02/06/17 77.8 kg (171 lb 9.6 oz)  04/24/16 77.6 kg (171 lb 1.9 oz)  04/02/16 78.7 kg (  173 lb 6.4 oz)    General: Somewhat pale, non-ill, comfortable appearing elderly WF. Head:  No facial asymmetry or swelling.  Eyes:  No scleral icterus, no conjunctival pallor. EOMI. Ears:  Not hard of hearing  Nose:  No congestion, no discharge. Mouth:  Dentures in place. Mucosal somewhat dry. Tongue midline. No mucosal lesions. No exudates. Neck:  No JVD, masses, thyromegaly. Lungs:  Clear bilaterally. Good breath sounds. No labored breathing at rest. Heart: RRR. No MRG. S1, S2 present Abdomen:  Soft. Nontender, nondistended. No masses, hepatosplenomegaly, bruits, hernias..   Rectal: Did not repeat rectal exam  performed by Dr. Ellender Hose who described stool as melenic.   Musc/Skeltl: Some postsurgical joint deformities of the knees. No joint erythema or swelling. Extremities:  No CCE.  Neurologic:  Alert. Oriented 3. Good historian. Moves all 4 limbs, though ROM of left shoulder limited.  No tremor.  No gross deficits Skin:  No telangectasia, no palmar erythema Tattoos:  none Nodes:  No cervical adenopathy   Psych:  Pleasant, cooperative, calm.    Intake/Output from previous day: No intake/output data recorded. Intake/Output this shift: No intake/output data recorded.  LAB RESULTS:  Recent Labs  02/13/17 1045  WBC 11.6*  HGB 6.4*  HCT 21.4*  PLT 288   BMET Lab Results  Component Value Date   NA 141 02/13/2017   NA 140 02/06/2017   NA 144 04/26/2014   K 2.8 (L) 02/13/2017   K 3.2 (L) 02/06/2017   K 4.3 04/26/2014   CL 98 (L) 02/13/2017   CL 93 (L) 02/06/2017   CL 106 04/26/2014   CO2 29 02/13/2017   CO2 27 02/06/2017   CO2 32 04/26/2014   GLUCOSE 157 (H) 02/13/2017   GLUCOSE 90 02/06/2017   GLUCOSE 122 (H) 04/26/2014   BUN 24 (H) 02/13/2017   BUN 14 02/06/2017   BUN 14 04/26/2014   CREATININE 0.89 02/13/2017   CREATININE 0.99 02/06/2017   CREATININE 0.74 04/26/2014   CALCIUM 8.9 02/13/2017   CALCIUM 9.3 02/06/2017   CALCIUM 9.0 04/26/2014   LFT  Recent Labs  02/13/17 1148  PROT 6.1*  ALBUMIN 3.0*  AST 18  ALT 9*  ALKPHOS 68  BILITOT 0.7  BILIDIR 0.1  IBILI 0.6   PT/INR Lab Results  Component Value Date   INR 1.16 02/13/2017   INR 1.45 04/26/2014   INR 0.97 02/10/2013   Hepatitis Panel No results for input(s): HEPBSAG, HCVAB, HEPAIGM, HEPBIGM in the last 72 hours. C-Diff No components found for: CDIFF Lipase     Component Value Date/Time   LIPASE 14 03/30/2011 0910    Drugs of Abuse  No results found for: LABOPIA, COCAINSCRNUR, LABBENZ, AMPHETMU, THCU, LABBARB   RADIOLOGY STUDIES: Dg Chest 2 View  Result Date: 02/13/2017 CLINICAL DATA:   Chest pain, dizziness for 1 week, COPD EXAM: CHEST  2 VIEW COMPARISON:  05/02/2014 FINDINGS: Cardiomediastinal silhouette is stable. Bilateral calcified breast implants again noted. Status post CABG. No infiltrate or pulmonary edema. Mild hyperinflation. Osteopenia and degenerative changes thoracic spine. Right shoulder prosthesis. Extensive degenerative changes left shoulder. IMPRESSION: No active disease. Bilateral calcified breast implants again noted. Status post CABG. Right shoulder prosthesis. Degenerative changes left shoulder. Electronically Signed   By: Lahoma Crocker M.D.   On: 02/13/2017 11:07     IMPRESSION:   *  GI bleed.  Sounds like UGIB with melena, elevated BUN.  And that she takes PPI twice a day, this is less likely an ulcer. Question  gastrointestinal AVMs?  *  Acute blood loss on chronic anemia.  Microcytic.    *  Chronic xarelto, stopped this last week.     *  Hypokalemia. Received 40 mEq oral potassium at noon.    PLAN:     *  Transfusion with the first of 2 PRBCs has begun.  *  Arrange for EGD tomorrow. Start po Protonix BID.     Azucena Freed  02/13/2017, 12:26 PM Pager: [phone]    I have reviewed the entire case in detail with the above APP and discussed the plan in detail.  Therefore, I agree with the diagnoses recorded above. In addition,  I have personally interviewed and examined the patient and have personally reviewed any abdominal/pelvic CT scan images.  My additional thoughts are as follows:  Subacute GI bleed, probably upper.  Persists despite stopping [place] last week.  EGD tomorrow after transfusion today.  She is agreeable, daughter with her at bedside.  The benefits and risks of the planned procedure were described in detail with the patient or (when appropriate) their health care proxy.  Risks were outlined as including, but not limited to, bleeding, infection, perforation, adverse medication reaction leading to cardiac or pulmonary decompensation,  or pancreatitis (if ERCP).  The limitation of incomplete mucosal visualization was also discussed.  No guarantees or warranties were given.   Patient at increased risk for cardiopulmonary complications of procedure due to medical comorbidities.    Nelida Meuse III Pager [phone]  Mon-Fri 8a-5p [phone] after 5p, weekends, holidays

## 2017-02-14 ENCOUNTER — Encounter (HOSPITAL_COMMUNITY): Payer: Self-pay | Admitting: *Deleted

## 2017-02-14 ENCOUNTER — Encounter (HOSPITAL_COMMUNITY)
Admission: EM | Disposition: A | Discharge status: Home / Self Care | Payer: Self-pay | Source: Home / Self Care | Attending: Internal Medicine

## 2017-02-14 DIAGNOSIS — Z880 Allergy status to penicillin: Secondary | ICD-10-CM | POA: Diagnosis not present

## 2017-02-14 DIAGNOSIS — Z87891 Personal history of nicotine dependence: Secondary | ICD-10-CM | POA: Diagnosis not present

## 2017-02-14 DIAGNOSIS — I110 Hypertensive heart disease with heart failure: Secondary | ICD-10-CM | POA: Diagnosis present

## 2017-02-14 DIAGNOSIS — Z96641 Presence of right artificial hip joint: Secondary | ICD-10-CM | POA: Diagnosis present

## 2017-02-14 DIAGNOSIS — K922 Gastrointestinal hemorrhage, unspecified: Secondary | ICD-10-CM | POA: Diagnosis not present

## 2017-02-14 DIAGNOSIS — Z96611 Presence of right artificial shoulder joint: Secondary | ICD-10-CM | POA: Diagnosis present

## 2017-02-14 DIAGNOSIS — E876 Hypokalemia: Secondary | ICD-10-CM | POA: Diagnosis present

## 2017-02-14 DIAGNOSIS — I25810 Atherosclerosis of coronary artery bypass graft(s) without angina pectoris: Secondary | ICD-10-CM | POA: Diagnosis not present

## 2017-02-14 DIAGNOSIS — R609 Edema, unspecified: Secondary | ICD-10-CM | POA: Diagnosis not present

## 2017-02-14 DIAGNOSIS — E039 Hypothyroidism, unspecified: Secondary | ICD-10-CM | POA: Diagnosis not present

## 2017-02-14 DIAGNOSIS — K254 Chronic or unspecified gastric ulcer with hemorrhage: Secondary | ICD-10-CM | POA: Diagnosis present

## 2017-02-14 DIAGNOSIS — J449 Chronic obstructive pulmonary disease, unspecified: Secondary | ICD-10-CM | POA: Diagnosis not present

## 2017-02-14 DIAGNOSIS — Z955 Presence of coronary angioplasty implant and graft: Secondary | ICD-10-CM | POA: Diagnosis not present

## 2017-02-14 DIAGNOSIS — I10 Essential (primary) hypertension: Secondary | ICD-10-CM | POA: Diagnosis not present

## 2017-02-14 DIAGNOSIS — K5520 Angiodysplasia of colon without hemorrhage: Secondary | ICD-10-CM | POA: Diagnosis not present

## 2017-02-14 DIAGNOSIS — K649 Unspecified hemorrhoids: Secondary | ICD-10-CM | POA: Diagnosis not present

## 2017-02-14 DIAGNOSIS — K921 Melena: Secondary | ICD-10-CM | POA: Diagnosis not present

## 2017-02-14 DIAGNOSIS — K219 Gastro-esophageal reflux disease without esophagitis: Secondary | ICD-10-CM | POA: Diagnosis present

## 2017-02-14 DIAGNOSIS — E440 Moderate protein-calorie malnutrition: Secondary | ICD-10-CM | POA: Diagnosis present

## 2017-02-14 DIAGNOSIS — D500 Iron deficiency anemia secondary to blood loss (chronic): Secondary | ICD-10-CM | POA: Diagnosis not present

## 2017-02-14 DIAGNOSIS — F329 Major depressive disorder, single episode, unspecified: Secondary | ICD-10-CM | POA: Diagnosis present

## 2017-02-14 DIAGNOSIS — D62 Acute posthemorrhagic anemia: Secondary | ICD-10-CM | POA: Diagnosis present

## 2017-02-14 DIAGNOSIS — E785 Hyperlipidemia, unspecified: Secondary | ICD-10-CM | POA: Diagnosis present

## 2017-02-14 DIAGNOSIS — K222 Esophageal obstruction: Secondary | ICD-10-CM | POA: Diagnosis present

## 2017-02-14 DIAGNOSIS — K5730 Diverticulosis of large intestine without perforation or abscess without bleeding: Secondary | ICD-10-CM | POA: Diagnosis not present

## 2017-02-14 DIAGNOSIS — K5733 Diverticulitis of large intestine without perforation or abscess with bleeding: Secondary | ICD-10-CM | POA: Diagnosis not present

## 2017-02-14 DIAGNOSIS — Z888 Allergy status to other drugs, medicaments and biological substances status: Secondary | ICD-10-CM | POA: Diagnosis not present

## 2017-02-14 DIAGNOSIS — K621 Rectal polyp: Secondary | ICD-10-CM | POA: Diagnosis present

## 2017-02-14 DIAGNOSIS — K319 Disease of stomach and duodenum, unspecified: Secondary | ICD-10-CM | POA: Diagnosis present

## 2017-02-14 DIAGNOSIS — K644 Residual hemorrhoidal skin tags: Secondary | ICD-10-CM | POA: Diagnosis present

## 2017-02-14 DIAGNOSIS — I2510 Atherosclerotic heart disease of native coronary artery without angina pectoris: Secondary | ICD-10-CM | POA: Diagnosis present

## 2017-02-14 DIAGNOSIS — Z96653 Presence of artificial knee joint, bilateral: Secondary | ICD-10-CM | POA: Diagnosis present

## 2017-02-14 DIAGNOSIS — I5032 Chronic diastolic (congestive) heart failure: Secondary | ICD-10-CM | POA: Diagnosis present

## 2017-02-14 DIAGNOSIS — R195 Other fecal abnormalities: Secondary | ICD-10-CM | POA: Diagnosis not present

## 2017-02-14 DIAGNOSIS — Z951 Presence of aortocoronary bypass graft: Secondary | ICD-10-CM | POA: Diagnosis not present

## 2017-02-14 HISTORY — PX: ESOPHAGOGASTRODUODENOSCOPY: SHX5428

## 2017-02-14 LAB — PREPARE RBC (CROSSMATCH)

## 2017-02-14 LAB — CBC
HCT: 22.8 % — ABNORMAL LOW (ref 36.0–46.0)
Hemoglobin: 7.1 g/dL — ABNORMAL LOW (ref 12.0–15.0)
MCH: 24.6 pg — ABNORMAL LOW (ref 26.0–34.0)
MCHC: 31.1 g/dL (ref 30.0–36.0)
MCV: 78.9 fL (ref 78.0–100.0)
Platelets: 268 10*3/uL (ref 150–400)
RBC: 2.89 MIL/uL — ABNORMAL LOW (ref 3.87–5.11)
RDW: 17.1 % — ABNORMAL HIGH (ref 11.5–15.5)
WBC: 10.8 10*3/uL — ABNORMAL HIGH (ref 4.0–10.5)

## 2017-02-14 LAB — BASIC METABOLIC PANEL
Anion gap: 8 (ref 5–15)
BUN: 24 mg/dL — ABNORMAL HIGH (ref 6–20)
CO2: 30 mmol/L (ref 22–32)
Calcium: 8.8 mg/dL — ABNORMAL LOW (ref 8.9–10.3)
Chloride: 103 mmol/L (ref 101–111)
Creatinine, Ser: 0.82 mg/dL (ref 0.44–1.00)
GFR calc Af Amer: >60 mL/min (ref >60)
GFR calc non Af Amer: >60 mL/min (ref >60)
Glucose, Bld: 102 mg/dL — ABNORMAL HIGH (ref 65–99)
Potassium: 3.6 mmol/L (ref 3.5–5.1)
Sodium: 141 mmol/L (ref 135–145)

## 2017-02-14 SURGERY — EGD (ESOPHAGOGASTRODUODENOSCOPY)
Anesthesia: Moderate Sedation

## 2017-02-14 MED ORDER — MIDAZOLAM HCL 10 MG/2ML IJ SOLN
INTRAMUSCULAR | Status: DC | PRN
  Administered 2017-02-14 (×3): 1 mg via INTRAVENOUS

## 2017-02-14 MED ORDER — DIPHENHYDRAMINE HCL 50 MG/ML IJ SOLN
INTRAMUSCULAR | Status: AC
  Filled 2017-02-14: qty 1

## 2017-02-14 MED ORDER — BUTAMBEN-TETRACAINE-BENZOCAINE 2-2-14 % EX AERO
INHALATION_SPRAY | CUTANEOUS | Status: DC | PRN
Start: 2017-02-14 — End: 2017-02-14
  Administered 2017-02-14: 2 via TOPICAL

## 2017-02-14 MED ORDER — SODIUM CHLORIDE 0.9 % IV SOLN
Freq: Once | INTRAVENOUS | Status: AC
  Administered 2017-02-14: 09:00:00 via INTRAVENOUS

## 2017-02-14 MED ORDER — MIDAZOLAM HCL 5 MG/ML IJ SOLN
INTRAMUSCULAR | Status: AC
  Filled 2017-02-14: qty 2

## 2017-02-14 MED ORDER — FENTANYL CITRATE (PF) 100 MCG/2ML IJ SOLN
INTRAMUSCULAR | Status: AC
  Filled 2017-02-14: qty 4

## 2017-02-14 MED ORDER — SPOT INK MARKER SYRINGE KIT
PACK | SUBMUCOSAL | Status: AC
  Filled 2017-02-14: qty 5

## 2017-02-14 MED ORDER — EPINEPHRINE PF 1 MG/10ML IJ SOSY
PREFILLED_SYRINGE | INTRAMUSCULAR | Status: AC
  Filled 2017-02-14: qty 10

## 2017-02-14 MED ORDER — PEG-KCL-NACL-NASULF-NA ASC-C 100 G PO SOLR
0.5000 | Freq: Once | ORAL | Status: AC
  Administered 2017-02-14: 100 g via ORAL
  Filled 2017-02-14: qty 1

## 2017-02-14 MED ORDER — SODIUM CHLORIDE 0.9 % IV SOLN
Freq: Once | INTRAVENOUS | Status: AC
  Administered 2017-02-14: 12:00:00 via INTRAVENOUS

## 2017-02-14 NOTE — Progress Notes (Signed)
Initial Nutrition Assessment  DOCUMENTATION CODES:   Non-severe (moderate) malnutrition in context of acute illness/injury  INTERVENTION:  Encouraged adequate intake of calories and protein with meals, snacks, and beverages.  Continue Ensure Enlive po BID, each supplement provides 350 kcal and 20 grams of protein.   NUTRITION DIAGNOSIS:   Inadequate oral intake related to poor appetite as evidenced by per patient/family report, 2.3 percent weight loss over 1 week.  GOAL:   Patient will meet greater than or equal to 90% of their needs  MONITOR:   PO intake, Supplement acceptance, Labs, I & O's, Weight trends  REASON FOR ASSESSMENT:   Malnutrition Screening Tool    ASSESSMENT:   76 year old female with PMHx of HTN, PVD, COPD, HLD, OA, DVT, Hyperthyroidism, CAD s/p CABG in [id number], Chronic diastolic heart failure, GERD who presented per referral of her PCP for chest pain, weakness, dark stools, epigastric pain.    Spoke with patient and family members at bedside. She had just returned from EGD which foundmoderate Schatzki ring at GE junction. Patient reports her appetite is pretty good. Her daughter reports it has been decreased this past week. She reports she has been eating 1-2 meals per day. Patient lives alone and does not cook. Patient reports a typical meal for her may be a chicken pie (approximately 430 kcal and 12 grams of protein) or other frozen meal. When she is feeling well she eats 2 meals daily with snacks between. She reports she also likes sweets and soda [facility]). Patient denies N/V, abdominal pain, constipation/diarrhea, or difficulty chewing/swallowing. She enjoys the Ensure and is amenable to continue drinking them until her appetite improves.  Patient reports her UBW is 160 lbs and she has not lost any weight. Per chart patient was 77.8 kg on 2/16 and has lost 1.8 kg (2.3% body weight) over one week, which is significant for time frame.   Medications reviewed  and include: levothyroxine.  Labs reviewed: Bun 24.  Nutrition-Focused physical exam completed. Findings are mild-moderate fat depletion in upper arm region, mild-moderate muscle depletion in dorsal hand, and no edema.   Diet Order:  Diet full liquid Room service appropriate? Yes; Fluid consistency: Thin Diet NPO time specified  Skin:  Reviewed, no issues  Last BM:  02/13/2017  Height:   Ht Readings from Last 1 Encounters:  02/13/17 5\' 7"  (1.702 m)    Weight:   Wt Readings from Last 1 Encounters:  02/13/17 167 lb 9.8 oz (76 kg)    Ideal Body Weight:  61.4 kg  BMI:  Body mass index is 26.25 kg/m.  Estimated Nutritional Needs:   Kcal:  1545-1675 (MSJ x 1.2-1.3)  Protein:  75-90 grams (1-1.2 grams/kg)  Fluid:  1.9 L/day (25 ml/kg)  EDUCATION NEEDS:   No education needs identified at this time  Willey Blade, MS, RD, LDN Pager: [phone] After Hours Pager: [phone]

## 2017-02-14 NOTE — Op Note (Signed)
[facility] Patient Name: [name] Procedure Date : 02/14/2017 MRN: [id number] Attending MD: Estill Cotta. [name] , MD Date of Birth: [birth year] CSN: [id number] Age: 76 Admit Type: Inpatient Procedure:                Upper GI endoscopy Indications:              Acute post hemorrhagic anemia, Melena Providers:                Mallie Mussel L. Judy Carrow, MD, Kingsley Plan, RN, Elspeth Cho Tech., Technician Referring MD:              Medicines:                Midazolam 3 mg IV, Benzocaine spray Complications:            No immediate complications. Estimated Blood Loss:     Estimated blood loss: none. Procedure:                Pre-Anesthesia Assessment:                           - Prior to the procedure, a History and Physical                            was performed, and patient medications and                            allergies were reviewed. The patient's tolerance of                            previous anesthesia was also reviewed. The risks                            and benefits of the procedure and the sedation                            options and risks were discussed with the patient.                            All questions were answered, and informed consent                            was obtained. Prior Anticoagulants: The patient has                            taken Xarelto (rivaroxaban), last dose was 5 days                            prior to procedure. ASA Grade Assessment: III - A                            patient with severe systemic disease. After  reviewing the risks and benefits, the patient was                            deemed in satisfactory condition to undergo the                            procedure.                           After obtaining informed consent, the endoscope was                            passed under direct vision. Throughout the                            procedure, the patient's blood  pressure, pulse, and                            oxygen saturations were monitored continuously. The                            [id number]) scope was introduced through the                            mouth, and advanced to the second part of duodenum.                            The upper GI endoscopy was accomplished without                            difficulty. The patient tolerated the procedure                            well. Scope In: Scope Out: Findings:      A moderate Schatzki ring (acquired) was found at the gastroesophageal       junction.      A few, non-bleeding erosions were found in the cardia. There were no       stigmata of recent bleeding.      A single 5 mm mucosal nodule was found in the duodenal bulb.It had the       classic appearance of a pancreatic rest (incidental and benign finding). Impression:               - Moderate Schatzki ring.                           - Non-bleeding erosive gastropathy.                           - Normal examined duodenum.                           - No specimens collected. Moderate Sedation:      Moderate (conscious) sedation was administered by the endoscopy nurse       and supervised by the endoscopist. The following parameters were  monitored: oxygen saturation, heart rate, blood pressure, respiratory       rate, EKG, adequacy of pulmonary ventilation, and response to care.       Total physician intraservice time was 8 minutes. Recommendation:           - Full liquid diet.                           - To visualize the small bowel, perform video                            capsule endoscopy tomorrow.                           - Post procedure medication orders were given. Procedure Code(s):        --- Professional ---                           [phone], Esophagogastroduodenoscopy, flexible,                            transoral; diagnostic, including collection of                            specimen(s) by brushing or  washing, when performed                            (separate procedure) Diagnosis Code(s):        --- Professional ---                           K22.2, Esophageal obstruction                           K31.89, Other diseases of stomach and duodenum                           D62, Acute posthemorrhagic anemia                           K92.1, Melena (includes Hematochezia) CPT copyright 2016 American [employer] Association. All rights reserved. The codes documented in this report are preliminary and upon coder review may  be revised to meet current compliance requirements. [name] L. Judy Carrow, MD 02/14/2017 3:29:47 PM This report has been signed electronically. Number of Addenda: 0

## 2017-02-14 NOTE — Progress Notes (Signed)
PROGRESS NOTE  [name] [id number] DOB: 09/02/1941 DOA: 02/13/2017 PCP: [name], MD   LOS: 0 days   Brief Narrative: [name] is a 76 y.o. female with medical history significant of CAD, s/p CABG,, CHF, PVD, COPD, DVT (has been on Xarelto till last week and it was discontinued d/t ongoing chest apin and plan to have an invasive procedure), HTN, hyperthyroidism, and anemia who presented to the ED per referral of her PCP. Patient has been having chest pain during the last three days and weakness. Patient had her blood work done by PCP and today received the phone call with instructions to go to the ED for evaluation d/t anemia. On admission patient also c/o some dark stools and epigastric pain  Assessment & Plan: Principal Problem:   GI bleed Active Problems:   Hypothyroidism   Essential hypertension   COPD (chronic obstructive pulmonary disease) (HCC)   CAD (coronary artery disease)   Dyslipidemia   Chest pain   Blood loss anemia   Acute blood loss anemia due to probable GI bleeding, symptomatic -Gastroenterology consulted, appreciate input, looks like she will be undergoing an EGD today.  For now [name] Meager will continue to be on hold, this was discontinued by cardiology last week in case she would have needed invasive cardiac procedures.  She is going to get 3 units of packed red blood cells.  Chest pain/shortness of breath -Progressive, this is in the setting of coronary artery disease, however most likely explained by her progressive anemia and ongoing GI bleed.  Will monitor symptoms after blood transfusions  COPD -Stable, no wheezing, continue home medications  Coronary artery disease status post CABG -We chest pain, which is most likely due to symptomatic anemia, monitor for symptoms after transfusion and correction of her anemia  Prior DVT 2 -Xarelto is currently on hold, will have to resume versus placement of IVC filter if she will be deemed not a  candidate for anticoagulation anymore  Hypertension -Blood pressure currently controlled, continue Imdur, continue lisinopril  Hyperlipidemia -Continue statin  Hypothyroidism -Continue Synthroid    DVT prophylaxis: SCDs Code Status: Full code Family Communication: No family at bedside Disposition Plan: EGD today  Consultants:   Gastroenterology  Procedures:   None   Antimicrobials:  None    Subjective: -Still feels short of breath this morning, no chest pain, no abdominal pain, no nausea vomiting or diarrhea.  Objective: Vitals:   02/14/17 0421 02/14/17 0800 02/14/17 0850 02/14/17 0915  BP: (!) 103/35 121/62 (!) 110/53 (!) 109/54  Pulse: 95 98 87 92  Resp: 12 16 16 16   Temp: 98.4 F (36.9 C) 98.4 F (36.9 C) 97.5 F (36.4 C) 98.1 F (36.7 C)  TempSrc: Oral Oral Oral Oral  SpO2: 99% 98% 96% 98%  Weight:      Height:        Intake/Output Summary (Last 24 hours) at 02/14/17 0928 Last data filed at 02/14/17 0800  Gross per 24 hour  Intake              535 ml  Output              551 ml  Net              -16 ml   Filed Weights   02/13/17 1618 02/13/17 1637  Weight: 77.1 kg (170 lb) 76 kg (167 lb 9.8 oz)    Examination: Constitutional: NAD Vitals:   02/14/17 0421 02/14/17 0800 02/14/17  0850 02/14/17 0915  BP: (!) 103/35 121/62 (!) 110/53 (!) 109/54  Pulse: 95 98 87 92  Resp: 12 16 16 16   Temp: 98.4 F (36.9 C) 98.4 F (36.9 C) 97.5 F (36.4 C) 98.1 F (36.7 C)  TempSrc: Oral Oral Oral Oral  SpO2: 99% 98% 96% 98%  Weight:      Height:       Eyes: PERRL, lids and conjunctivae pale ENMT: Mucous membranes are dry Respiratory: clear to auscultation bilaterally, no wheezing, no crackles. Normal respiratory effort.  Cardiovascular: Regular rate and rhythm, no murmurs / rubs / gallops. No LE edema. 2+ pedal pulses.  Abdomen: no tenderness. Bowel sounds positive.  Musculoskeletal: no clubbing / cyanosis.  Neurologic: CN 2-12 grossly intact.  Strength 5/5 in all 4.  Psychiatric: Alert and oriented x 3.     Data Reviewed: I have personally reviewed following labs and imaging studies  CBC:  Recent Labs Lab 02/13/17 1045 02/14/17 0232  WBC 11.6* 10.8*  HGB 6.4* 7.1*  HCT 21.4* 22.8*  MCV 75.9* 78.9  PLT 288 [id number]   Basic Metabolic Panel:  Recent Labs Lab 02/13/17 1045 02/14/17 0232  NA 141 141  K 2.8* 3.6  CL 98* 103  CO2 29 30  GLUCOSE 157* 102*  BUN 24* 24*  CREATININE 0.89 0.82  CALCIUM 8.9 8.8*   GFR: Estimated Creatinine Clearance: 62.1 mL/min (by C-G formula based on SCr of 0.82 mg/dL). Liver Function Tests:  Recent Labs Lab 02/13/17 1148  AST 18  ALT 9*  ALKPHOS 68  BILITOT 0.7  PROT 6.1*  ALBUMIN 3.0*   No results for input(s): LIPASE, AMYLASE in the last 168 hours. No results for input(s): AMMONIA in the last 168 hours. Coagulation Profile:  Recent Labs Lab 02/13/17 1148  INR 1.16   Cardiac Enzymes: No results for input(s): CKTOTAL, CKMB, CKMBINDEX, TROPONINI in the last 168 hours. BNP (last 3 results)  Recent Labs  02/06/17 1558  PROBNP 508   HbA1C: No results for input(s): HGBA1C in the last 72 hours. CBG: No results for input(s): GLUCAP in the last 168 hours. Lipid Profile: No results for input(s): CHOL, HDL, LDLCALC, TRIG, CHOLHDL, LDLDIRECT in the last 72 hours. Thyroid Function Tests: No results for input(s): TSH, T4TOTAL, FREET4, T3FREE, THYROIDAB in the last 72 hours. Anemia Panel: No results for input(s): VITAMINB12, FOLATE, FERRITIN, TIBC, IRON, RETICCTPCT in the last 72 hours. Urine analysis:    Component Value Date/Time   COLORURINE YELLOW 03/30/2011 0901   APPEARANCEUR CLEAR 03/30/2011 0901   LABSPEC 1.015 03/30/2011 0901   PHURINE 6.5 03/30/2011 0901   GLUCOSEU NEGATIVE 03/30/2011 0901   HGBUR NEGATIVE 03/30/2011 0901   BILIRUBINUR NEGATIVE 03/30/2011 0901   KETONESUR NEGATIVE 03/30/2011 0901   PROTEINUR NEGATIVE 03/30/2011 0901   UROBILINOGEN 1.0  03/30/2011 0901   NITRITE NEGATIVE 03/30/2011 0901   LEUKOCYTESUR  03/30/2011 0901    NEGATIVE MICROSCOPIC NOT DONE ON URINES WITH NEGATIVE PROTEIN, BLOOD, LEUKOCYTES, NITRITE, OR GLUCOSE <1000 mg/dL.   Sepsis Labs: Invalid input(s): PROCALCITONIN, LACTICIDVEN  Recent Results (from the past 240 hour(s))  MRSA PCR Screening     Status: None   Collection Time: 02/13/17  5:02 PM  Result Value Ref Range Status   MRSA by PCR NEGATIVE NEGATIVE Final    Comment:        The GeneXpert MRSA Assay (FDA approved for NASAL specimens only), is one component of a comprehensive MRSA colonization surveillance program. It is not intended to diagnose MRSA  infection nor to guide or monitor treatment for MRSA infections.       Radiology Studies: Dg Chest 2 View  Result Date: 02/13/2017 CLINICAL DATA:  Chest pain, dizziness for 1 week, COPD EXAM: CHEST  2 VIEW COMPARISON:  05/02/2014 FINDINGS: Cardiomediastinal silhouette is stable. Bilateral calcified breast implants again noted. Status post CABG. No infiltrate or pulmonary edema. Mild hyperinflation. Osteopenia and degenerative changes thoracic spine. Right shoulder prosthesis. Extensive degenerative changes left shoulder. IMPRESSION: No active disease. Bilateral calcified breast implants again noted. Status post CABG. Right shoulder prosthesis. Degenerative changes left shoulder. Electronically Signed   By: Lahoma Crocker M.D.   On: 02/13/2017 11:07     Scheduled Meds: . sodium chloride   Intravenous Once  . atorvastatin  20 mg Oral Daily  . citalopram  20 mg Oral TID  . feeding supplement (ENSURE ENLIVE)  237 mL Oral BID BM  . isosorbide mononitrate  60 mg Oral QHS  . levothyroxine  137 mcg Oral QAC breakfast  . lisinopril  20 mg Oral Daily  . pantoprazole  40 mg Oral BID  . pilocarpine  1 drop Both Eyes BID  . roflumilast  500 mcg Oral QHS  . traZODone  150 mg Oral QHS  . umeclidinium bromide  1 puff Inhalation BID   Continuous  Infusions: . sodium chloride 20 mL/hr at 02/14/17 0501     Marzetta Board, MD, PhD [facility] Pager [phone]  If 7PM-7AM, please contact night-coverage [email or web address] Password [facility] 02/14/2017, 9:28 AM

## 2017-02-14 NOTE — H&P (View-Only) (Signed)
[facility] Consult: 12:26 PM 02/13/2017  LOS: 0 days    Referring Provider: Dr Ellender Hose in ED  Primary Care Physician:  [name], MD Primary Gastroenterologist:  Dr. Melina Copa in [place]     Reason for Consultation:  GI bleed: melenic stool, symptomatic anemia.     HPI: [name] is a 76 y.o. female.  PMH diastolilc heart failure.  CAD, s/p CABG 2004, DES 2010.  Hx DVT x 2.  On chronic xarelto.  COPD.  Carotid stenosis.  DJD s/p replacement of shoulder, knees, hips.  S/p spinal surgery.  Previous upper endoscopy and colonoscopy by Dr. Melina Copa. The latest procedures performed within the last 12 months and were unremarkable:  No colon polyps. Stable/asymptomatic GERD.  History diverticulitis.   For 7-10 days has had increasing DOE, fatigues easily, weakness. Presyncope when she stands up. Increased heart rate with minor activities.  Appetite is diminished. Bowel movements have been brown and she has never seen blood or melenic stool.  No nausea vomiting.  Her one GI complaint is that she is passing a lot of gas which has a particularly strong odor.  Takes Omeprazole 40 mg BID.  No NSAIDs other than occasional BCs powders, 1 x every several days to few weeks.  She was seen by [name], cardiologist, last week and was taken off Xarelto in anticipation that she may need invasive cardiac procedures. Hemoglobin was obtained and her hemoglobin was 7.7. Dr. Harrington Challenger started her on oral iron.  Plan was to have follow-up labs at her PMD office today, her symptoms had persisted and she thought she was going to meet with her doctor but it turned out it was only a lab visit. Because her symptoms have persisted, her daughter chose to take her mother directly to the emergency department and skipped the lab work this  morning. Here in the ED CXR unremarkable.     Hgb of 2/16: 7.7.  6.4 today. ~ 11 in 2014 and 2015.  MCV 73-75.  Coags normal.  TSH elevated at 8.0 last week.  BUN 14/0.9 >> 24/.89 over last seven days.     Past Medical History:  Diagnosis Date  . Anemia    bld. transfusion post lumbar surgery- 2012  . Arthralgia    NOS  . Blood transfusion 2011   WITH BACK SURGERY  . CAD (coronary artery disease)    s/p CABG 2004; s/p DES to LM in 2010;  [place] 10/29/11: EF 50-55%, mild elevated filling pressures, no pulmonary HTN, LM 90% ISR, LAD and CFX occluded, S-RI occluded (old), S-OM3 ok and L-LAD ok, native nondominant RCA 95% -  med rx recommended ; Lexiscan Myoview 7/13 at [facility]: demonstrated "normal LV function, anterior attenuation and localized ischemia, inferior, basilar, mid section"  . Carotid artery occlusion    moderate  . Chronic diastolic heart failure (HCC)    E[place] 9/10: EF [id number], grade 1 diastolic dysfunction  . COPD (chronic obstructive pulmonary disease) (HCC)    Emphysema dxed by Dr. Woody Seller in [facility] based on PFTs  per pt in 2006; placed on albuterol  . Depression    TAKES CELEXA  AND  (OFF- WELBUTRIN)  . DVT of lower extremity (deep venous thrombosis) (HCC)    recurrent. bilateral (2 episodes)  . Exertional angina (HCC)    Treated with Isosorbide, Ranexa, amlodipine; intolerant to metoprolol  . GERD (gastroesophageal reflux disease)   . HLD (hyperlipidemia)   . Hypertension   . Hyperthyroidism   . Obesity (BMI 30-39.9) 2009   BMI 33  . Osteoarthritis   . PVD (peripheral vascular disease) ([place])   . Sleep apnea 2012   USED CPAP THEN  STARTED USING SPIRIVA , Nov. 2013- last evaluation , changed from mask to aparatus that is just for her nose.Marland Kitchen     Past Surgical History:  Procedure Laterality Date  . ANKLE SURGERY  2004  . [place]   lower; another scheduled, opt. reports 4 back- lumbar, 3 cerv. fusions  for later 2009  . BREAST ENHANCEMENT SURGERY     . CARDIAC CATHETERIZATION  11/2009   Patent LIMA to LAD and patent SVG to OM1. Occluded SVG to ramus and diagonal. Left main: 90% ostial stenosis, LCX 60-70% proximal stenosis  . CORONARY ANGIOPLASTY WITH STENT PLACEMENT  11/2009   Drug eluting stent to left main artery: 4.0 X 12 mm Ion   . CORONARY ARTERY BYPASS GRAFT  2004  . EYE SURGERY  2006   BILATERAL CAT EXT.   Marland Kitchen GROIN MASS OPEN BIOPSY  2004  . JOINT REPLACEMENT  2012    BILATERAL KNEES  . JOINT REPLACEMENT  2010    RIGHT HIP REPLACEMENT  . NECK SURGERY  12/08   Due for repeat neck surgery  . REVERSE SHOULDER ARTHROPLASTY  02/26/2012   Procedure: REVERSE SHOULDER ARTHROPLASTY;  Surgeon: Marin Shutter, MD;  Location: [facility];  Service: Orthopedics;  Laterality: Right;  RIGHT SHOULDER REVERSED ARTHROPLASTY  . SHOULDER ARTHROSCOPY WITH SUBACROMIAL DECOMPRESSION Left 02/10/2013   Procedure: SHOULDER ARTHROSCOPY WITH SUBACROMIAL DECOMPRESSION DISTAL CLAVICLE RESECTION;  Surgeon: Marin Shutter, MD;  Location: [facility];  Service: Orthopedics;  Laterality: Left;  DISTAL CLAVICLE RESECTION  . TOTAL HIP ARTHROPLASTY  2007   Right    Prior to Admission medications   Medication Sig Start Date End Date Taking? Authorizing Provider  atorvastatin (LIPITOR) 20 MG tablet TAKE 1 TABLET ONCE DAILY. 11/25/14   Fay Records, MD  citalopram (CELEXA) 20 MG tablet Take 60 mg by mouth daily. Takes one tablet TID    Historical Provider, MD  cyclobenzaprine (FLEXERIL) 10 MG tablet Take 10 mg by mouth 3 (three) times daily as needed.     Historical Provider, MD  furosemide (LASIX) 80 MG tablet Take 80 mg by mouth 2 (two) times daily.     Historical Provider, MD  isosorbide mononitrate (IMDUR) 60 MG 24 hr tablet TAKE 1 TABLET AT BEDTIME. 04/24/15   Fay Records, MD  levothyroxine (SYNTHROID, LEVOTHROID) 137 MCG tablet Take 137 mcg by mouth daily before breakfast.     Historical Provider, MD  lisinopril (PRINIVIL,ZESTRIL) 20 MG tablet Take 20-40 mg by mouth daily.  Pt. Reports that she takes her BP & if its elevated she takes 40 mg. Instead of 20mg .    Historical Provider, MD  nitroGLYCERIN (NITROSTAT) 0.4 MG SL tablet Place 1 tablet (0.4 mg total) under the tongue every 5 (five) minutes as needed for chest pain. For chest pain 04/24/16   Fay Records, MD  omeprazole [facility])  40 MG capsule Take 40 mg by mouth 2 (two) times daily.     Historical Provider, MD  oxyCODONE-acetaminophen (PERCOCET) 10-325 MG per tablet Take 1 tablet by mouth every 6 (six) hours as needed for pain.     Historical Provider, MD  pilocarpine (PILOCAR) 4 % ophthalmic solution Place 1 drop into both eyes 2 (two) times daily.    Historical Provider, MD  potassium chloride (KLOR-CON) 20 MEQ packet Take 20 mEq by mouth daily.    Historical Provider, MD  pramipexole (MIRAPEX) 0.25 MG tablet Take 0.25 mg by mouth at bedtime.     Historical Provider, MD  roflumilast (DALIRESP) 500 MCG TABS tablet Take 500 mcg by mouth at bedtime.     Historical Provider, MD  tiotropium (SPIRIVA) 18 MCG inhalation capsule Place 18 mcg into inhaler and inhale daily.    Historical Provider, MD  traZODone (DESYREL) 50 MG tablet Take 150 mg by mouth at bedtime.     Historical Provider, MD  VITAMIN D, ERGOCALCIFEROL, PO Take 1 tablet by mouth daily.    Historical Provider, MD    Scheduled Meds:  Infusions:  PRN Meds:    Allergies as of 02/13/2017 - Review Complete 02/13/2017  Allergen Reaction Noted  . Zolpidem tartrate Anxiety 02/23/2012  . Penicillins Rash 04/12/2008    Family History  Problem Relation Age of Onset  . Ovarian cancer Mother   . Lung cancer Father   . Cancer    . Heart disease    . Colitis      Social History   Social History  . Marital status: Married    Spouse name: N/A  . Number of children: 3  . Years of education: N/A   Occupational History  . retired     Corporate treasurer  .  Retired   Social History Main Topics  . Smoking status: Former Smoker    Years: 45.00     Quit date: 12/22/2002  . Smokeless tobacco: Never Used  . Alcohol use No  . Drug use: No  . Sexual activity: No   Other Topics Concern  . Not on file   Social History Narrative   Married 109 years, lives with husband.   3 grown children.    REVIEW OF SYSTEMS: Constitutional:  Per HPI ENT:  No nose bleeds Pulm:  Per HPI.  No cough, no pleuritic pain CV:  Tachy with exertion.  No CP GU:  No hematuria, no frequency GI:  Per HPI Heme:  No unusual bleeding or bruising.   Transfusions:  The only transfusion she recalls were at the time of her coronary bypass surgery.  A few years ago she was put on iron but eventually it was discontinued. Oral iron was just recently started by Dr. Harrington Challenger last week. Neuro:  Per HPI MS:  Describes her left shoulder replacement as "failed" and range of motion is very limited there Derm:  No itching, no rash or sores.  Endocrine:  No sweats or chills.  No polyuria or dysuria Immunization:  Not queried.   Travel:  None beyond local counties in last few months.    PHYSICAL EXAM: Vital signs in last 24 hours: Vitals:   02/13/17 1130 02/13/17 1200  BP: (!) 116/51 (!) 117/53  Pulse: 94 85  Resp: (!) 27 18  Temp:     Wt Readings from Last 3 Encounters:  02/06/17 77.8 kg (171 lb 9.6 oz)  04/24/16 77.6 kg (171 lb 1.9 oz)  04/02/16 78.7 kg (  173 lb 6.4 oz)    General: Somewhat pale, non-ill, comfortable appearing elderly WF. Head:  No facial asymmetry or swelling.  Eyes:  No scleral icterus, no conjunctival pallor. EOMI. Ears:  Not hard of hearing  Nose:  No congestion, no discharge. Mouth:  Dentures in place. Mucosal somewhat dry. Tongue midline. No mucosal lesions. No exudates. Neck:  No JVD, masses, thyromegaly. Lungs:  Clear bilaterally. Good breath sounds. No labored breathing at rest. Heart: RRR. No MRG. S1, S2 present Abdomen:  Soft. Nontender, nondistended. No masses, hepatosplenomegaly, bruits, hernias..   Rectal: Did not repeat rectal exam  performed by Dr. Ellender Hose who described stool as melenic.   Musc/Skeltl: Some postsurgical joint deformities of the knees. No joint erythema or swelling. Extremities:  No CCE.  Neurologic:  Alert. Oriented 3. Good historian. Moves all 4 limbs, though ROM of left shoulder limited.  No tremor.  No gross deficits Skin:  No telangectasia, no palmar erythema Tattoos:  none Nodes:  No cervical adenopathy   Psych:  Pleasant, cooperative, calm.    Intake/Output from previous day: No intake/output data recorded. Intake/Output this shift: No intake/output data recorded.  LAB RESULTS:  Recent Labs  02/13/17 1045  WBC 11.6*  HGB 6.4*  HCT 21.4*  PLT 288   BMET Lab Results  Component Value Date   NA 141 02/13/2017   NA 140 02/06/2017   NA 144 04/26/2014   K 2.8 (L) 02/13/2017   K 3.2 (L) 02/06/2017   K 4.3 04/26/2014   CL 98 (L) 02/13/2017   CL 93 (L) 02/06/2017   CL 106 04/26/2014   CO2 29 02/13/2017   CO2 27 02/06/2017   CO2 32 04/26/2014   GLUCOSE 157 (H) 02/13/2017   GLUCOSE 90 02/06/2017   GLUCOSE 122 (H) 04/26/2014   BUN 24 (H) 02/13/2017   BUN 14 02/06/2017   BUN 14 04/26/2014   CREATININE 0.89 02/13/2017   CREATININE 0.99 02/06/2017   CREATININE 0.74 04/26/2014   CALCIUM 8.9 02/13/2017   CALCIUM 9.3 02/06/2017   CALCIUM 9.0 04/26/2014   LFT  Recent Labs  02/13/17 1148  PROT 6.1*  ALBUMIN 3.0*  AST 18  ALT 9*  ALKPHOS 68  BILITOT 0.7  BILIDIR 0.1  IBILI 0.6   PT/INR Lab Results  Component Value Date   INR 1.16 02/13/2017   INR 1.45 04/26/2014   INR 0.97 02/10/2013   Hepatitis Panel No results for input(s): HEPBSAG, HCVAB, HEPAIGM, HEPBIGM in the last 72 hours. C-Diff No components found for: CDIFF Lipase     Component Value Date/Time   LIPASE 14 03/30/2011 0910    Drugs of Abuse  No results found for: LABOPIA, COCAINSCRNUR, LABBENZ, AMPHETMU, THCU, LABBARB   RADIOLOGY STUDIES: Dg Chest 2 View  Result Date: 02/13/2017 CLINICAL DATA:   Chest pain, dizziness for 1 week, COPD EXAM: CHEST  2 VIEW COMPARISON:  05/02/2014 FINDINGS: Cardiomediastinal silhouette is stable. Bilateral calcified breast implants again noted. Status post CABG. No infiltrate or pulmonary edema. Mild hyperinflation. Osteopenia and degenerative changes thoracic spine. Right shoulder prosthesis. Extensive degenerative changes left shoulder. IMPRESSION: No active disease. Bilateral calcified breast implants again noted. Status post CABG. Right shoulder prosthesis. Degenerative changes left shoulder. Electronically Signed   By: Lahoma Crocker M.D.   On: 02/13/2017 11:07     IMPRESSION:   *  GI bleed.  Sounds like UGIB with melena, elevated BUN.  And that she takes PPI twice a day, this is less likely an ulcer. Question  gastrointestinal AVMs?  *  Acute blood loss on chronic anemia.  Microcytic.    *  Chronic xarelto, stopped this last week.     *  Hypokalemia. Received 40 mEq oral potassium at noon.    PLAN:     *  Transfusion with the first of 2 PRBCs has begun.  *  Arrange for EGD tomorrow. Start po Protonix BID.     Azucena Freed  02/13/2017, 12:26 PM Pager: [phone]    I have reviewed the entire case in detail with the above APP and discussed the plan in detail.  Therefore, I agree with the diagnoses recorded above. In addition,  I have personally interviewed and examined the patient and have personally reviewed any abdominal/pelvic CT scan images.  My additional thoughts are as follows:  Subacute GI bleed, probably upper.  Persists despite stopping [place] last week.  EGD tomorrow after transfusion today.  She is agreeable, daughter with her at bedside.  The benefits and risks of the planned procedure were described in detail with the patient or (when appropriate) their health care proxy.  Risks were outlined as including, but not limited to, bleeding, infection, perforation, adverse medication reaction leading to cardiac or pulmonary decompensation,  or pancreatitis (if ERCP).  The limitation of incomplete mucosal visualization was also discussed.  No guarantees or warranties were given.   Patient at increased risk for cardiopulmonary complications of procedure due to medical comorbidities.    Nelida Meuse III Pager [phone]  Mon-Fri 8a-5p [phone] after 5p, weekends, holidays

## 2017-02-14 NOTE — Interval H&P Note (Signed)
History and Physical Interval Note:  02/14/2017 2:57 PM  [name]  has presented today for surgery, with the diagnosis of FOBT positive, dark stool, symptomatic anemia  The various methods of treatment have been discussed with the patient and family. After consideration of risks, benefits and other options for treatment, the patient has consented to  Procedure(s): ESOPHAGOGASTRODUODENOSCOPY (EGD) (N/A) as a surgical intervention .  The patient's history has been reviewed, patient examined, no change in status, stable for surgery.  I have reviewed the patient's chart and labs.  Questions were answered to the patient's satisfaction.     Arranged 2 units PRBC transfusion earlier today for this patient.  [name]

## 2017-02-14 NOTE — Progress Notes (Signed)
After EGD, medications wasted with Elspeth Cho, Endo Tech in trash. Wasted Versed 2mg  and Fentanyl 180mcg.

## 2017-02-15 ENCOUNTER — Encounter (HOSPITAL_COMMUNITY)
Admission: EM | Disposition: A | Discharge status: Home / Self Care | Payer: Self-pay | Source: Home / Self Care | Attending: Internal Medicine

## 2017-02-15 DIAGNOSIS — E440 Moderate protein-calorie malnutrition: Secondary | ICD-10-CM | POA: Insufficient documentation

## 2017-02-15 DIAGNOSIS — E43 Unspecified severe protein-calorie malnutrition: Secondary | ICD-10-CM | POA: Insufficient documentation

## 2017-02-15 HISTORY — PX: GIVENS CAPSULE STUDY: SHX5432

## 2017-02-15 LAB — PREPARE RBC (CROSSMATCH)

## 2017-02-15 LAB — BASIC METABOLIC PANEL
Anion gap: 6 (ref 5–15)
BUN: 18 mg/dL (ref 6–20)
CO2: 30 mmol/L (ref 22–32)
Calcium: 8.7 mg/dL — ABNORMAL LOW (ref 8.9–10.3)
Chloride: 106 mmol/L (ref 101–111)
Creatinine, Ser: 0.73 mg/dL (ref 0.44–1.00)
GFR calc Af Amer: >60 mL/min (ref >60)
GFR calc non Af Amer: >60 mL/min (ref >60)
Glucose, Bld: 100 mg/dL — ABNORMAL HIGH (ref 65–99)
Potassium: 3.2 mmol/L — ABNORMAL LOW (ref 3.5–5.1)
Sodium: 142 mmol/L (ref 135–145)

## 2017-02-15 LAB — CBC
HCT: 24.9 % — ABNORMAL LOW (ref 36.0–46.0)
Hemoglobin: 7.9 g/dL — ABNORMAL LOW (ref 12.0–15.0)
MCH: 25.5 pg — ABNORMAL LOW (ref 26.0–34.0)
MCHC: 31.7 g/dL (ref 30.0–36.0)
MCV: 80.3 fL (ref 78.0–100.0)
Platelets: 245 10*3/uL (ref 150–400)
RBC: 3.1 MIL/uL — ABNORMAL LOW (ref 3.87–5.11)
RDW: 17.3 % — ABNORMAL HIGH (ref 11.5–15.5)
WBC: 8.8 10*3/uL (ref 4.0–10.5)

## 2017-02-15 LAB — HEMOGLOBIN AND HEMATOCRIT, BLOOD
HCT: 29.1 % — ABNORMAL LOW (ref 36.0–46.0)
Hemoglobin: 9.1 g/dL — ABNORMAL LOW (ref 12.0–15.0)

## 2017-02-15 SURGERY — IMAGING PROCEDURE, GI TRACT, INTRALUMINAL, VIA CAPSULE
Anesthesia: LOCAL

## 2017-02-15 MED ORDER — SODIUM CHLORIDE 0.9 % IV SOLN
Freq: Once | INTRAVENOUS | Status: AC
  Administered 2017-02-15: 10 mL/h via INTRAVENOUS

## 2017-02-15 MED ORDER — UMECLIDINIUM BROMIDE 62.5 MCG/INH IN AEPB
1.0000 | INHALATION_SPRAY | Freq: Every day | RESPIRATORY_TRACT | Status: DC
  Administered 2017-02-15 – 2017-02-18 (×4): 1 via RESPIRATORY_TRACT
  Filled 2017-02-15: qty 7

## 2017-02-15 MED ORDER — POTASSIUM CHLORIDE CRYS ER 20 MEQ PO TBCR
40.0000 meq | EXTENDED_RELEASE_TABLET | Freq: Once | ORAL | Status: AC
  Administered 2017-02-15: 40 meq via ORAL
  Filled 2017-02-15: qty 2

## 2017-02-15 SURGICAL SUPPLY — 1 items: TOWEL COTTON PACK 4EA (MISCELLANEOUS) ×4 IMPLANT

## 2017-02-15 NOTE — Progress Notes (Signed)
Belt removed from patient at 8:45 pm per endo notes. Capsule given at 8:45 am. Pt has had no issues with wearing the belt. Pt resting in bed in no apparent distress. Endo will pick up equipment in patient belonging bag from front desk in the morning.

## 2017-02-15 NOTE — Progress Notes (Signed)
Patient ingested givens capsule for small bowel study today at [facility]. Patient will need to wear equipment for 12 hours, can be removed at 2045 today. Patient is remain NPO for 2 hours. Then follow written instructions for PO intake. Written instructions reviewed with and given to patient and primary RN.

## 2017-02-15 NOTE — Progress Notes (Signed)
PROGRESS NOTE  [name] [id number] DOB: 03-04-1941 DOA: 02/13/2017 PCP: [name], MD   LOS: 1 day   Brief Narrative: [name] is a 76 y.o. female with medical history significant of CAD, s/p CABG,, CHF, PVD, COPD, DVT (has been on Xarelto till last week and it was discontinued d/t ongoing chest apin and plan to have an invasive procedure), HTN, hyperthyroidism, and anemia who presented to the ED per referral of her PCP. Patient has been having chest pain during the last three days and weakness. Patient had her blood work done by PCP and today received the phone call with instructions to go to the ED for evaluation d/t anemia. On admission patient also c/o some dark stools and epigastric pain  Assessment & Plan: Principal Problem:   GI bleed Active Problems:   Hypothyroidism   Essential hypertension   COPD (chronic obstructive pulmonary disease) (HCC)   CAD (coronary artery disease)   Dyslipidemia   Chest pain   Blood loss anemia   Malnutrition of moderate degree   Acute blood loss anemia due to probable GI bleeding, symptomatic -Gastroenterology consulted, appreciate input, patient underwent an EGD on 2/24 which showed moderate Schatzki ring, nonbleeding erosive gastropathy and normal duodenum. -She will undergo capsule endoscopy today since no clear cut source was determined on upper EGD. -Patient with significant bright red blood per rectum throughout the night, which may be related to the prep for capsule endoscopy versus colonic bleed -She has received a total of 4 units packed red blood cells so far, her hemoglobin is 7.9, she has significant cardiac history and with ongoing GI bleed would like to keep it above 8, will transfuse another unit of packed red blood cell today  Chest pain/shortness of breath -Progressive, this is in the setting of coronary artery disease, however most likely explained by her progressive anemia and ongoing GI bleed.  -She feels an  improvement after transfusions yesterday  COPD -Stable, no wheezing, continue home medications  Coronary artery disease status post CABG -We chest pain, which is most likely due to symptomatic anemia, she is chest pain-free, feels improved after transfusions  Hypokalemia -Probably related to GI losses overnight, replete and repeat tomorrow morning  Prior DVT 2 -Xarelto is currently on hold, will have to resume versus placement of IVC filter if she will be deemed not a candidate for anticoagulation anymore  Hypertension -Blood pressure currently controlled, continue Imdur, continue lisinopril  Hyperlipidemia -Continue statin  Hypothyroidism -Continue Synthroid    DVT prophylaxis: SCDs Code Status: Full code Family Communication: No family at bedside Disposition Plan: To be determined  Consultants:   Gastroenterology  Procedures:   EGD 2/24  Impression:       - Moderate Schatzki ring.                           - Non-bleeding erosive gastropathy.                           - Normal examined duodenum.                           - No specimens collected.  Antimicrobials:  None    Subjective: -Complaints of multiple trips to the bathroom with bright red blood per rectum in large amounts  Objective: Vitals:   02/14/17 2139 02/15/17 0252 02/15/17 0339 02/15/17 [id number]  BP: (!) 109/56  (!) 103/44   Pulse: 97  86   Resp: 18  18   Temp: 97.6 F (36.4 C)  98.2 F (36.8 C)   TempSrc: Oral  Oral   SpO2: 96%  100%   Weight:  75.4 kg (166 lb 3.2 oz)  75.3 kg (166 lb)  Height:    5\' 7"  (1.702 m)    Intake/Output Summary (Last 24 hours) at 02/15/17 0916 Last data filed at 02/15/17 0700  Gross per 24 hour  Intake             1053 ml  Output                0 ml  Net             1053 ml   Filed Weights   02/13/17 1637 02/15/17 0252 02/15/17 0837  Weight: 76 kg (167 lb 9.8 oz) 75.4 kg (166 lb 3.2 oz) 75.3 kg (166 lb)    Examination: Constitutional: NAD Vitals:    02/14/17 2139 02/15/17 0252 02/15/17 0339 02/15/17 0837  BP: (!) 109/56  (!) 103/44   Pulse: 97  86   Resp: 18  18   Temp: 97.6 F (36.4 C)  98.2 F (36.8 C)   TempSrc: Oral  Oral   SpO2: 96%  100%   Weight:  75.4 kg (166 lb 3.2 oz)  75.3 kg (166 lb)  Height:    5\' 7"  (1.702 m)   Eyes: PERRL, lids and conjunctivae pale ENMT: Mucous membranes are dry Respiratory: clear to auscultation bilaterally, no wheezing, no crackles. Normal respiratory effort.  Cardiovascular: Regular rate and rhythm, no murmurs / rubs / gallops. No LE edema. 2+ pedal pulses.  Abdomen: no tenderness. Bowel sounds positive.  Musculoskeletal: no clubbing / cyanosis.  Neurologic: CN 2-12 grossly intact. Strength 5/5 in all 4.  Psychiatric: Alert and oriented x 3.     Data Reviewed: I have personally reviewed following labs and imaging studies  CBC:  Recent Labs Lab 02/13/17 1045 02/14/17 0232 02/15/17 0210  WBC 11.6* 10.8* 8.8  HGB 6.4* 7.1* 7.9*  HCT 21.4* 22.8* 24.9*  MCV 75.9* 78.9 80.3  PLT 288 268 [id number]   Basic Metabolic Panel:  Recent Labs Lab 02/13/17 1045 02/14/17 0232 02/15/17 0210  NA 141 141 142  K 2.8* 3.6 3.2*  CL 98* 103 106  CO2 29 30 30   GLUCOSE 157* 102* 100*  BUN 24* 24* 18  CREATININE 0.89 0.82 0.73  CALCIUM 8.9 8.8* 8.7*   Liver Function Tests:  Recent Labs Lab 02/13/17 1148  AST 18  ALT 9*  ALKPHOS 68  BILITOT 0.7  PROT 6.1*  ALBUMIN 3.0*   No results for input(s): LIPASE, AMYLASE in the last 168 hours. No results for input(s): AMMONIA in the last 168 hours. Coagulation Profile:  Recent Labs Lab 02/13/17 1148  INR 1.16   Cardiac Enzymes: No results for input(s): CKTOTAL, CKMB, CKMBINDEX, TROPONINI in the last 168 hours. BNP (last 3 results)  Recent Labs  02/06/17 1558  PROBNP 508   HbA1C: No results for input(s): HGBA1C in the last 72 hours. CBG: No results for input(s): GLUCAP in the last 168 hours. Lipid Profile: No results for input(s):  CHOL, HDL, LDLCALC, TRIG, CHOLHDL, LDLDIRECT in the last 72 hours. Thyroid Function Tests: No results for input(s): TSH, T4TOTAL, FREET4, T3FREE, THYROIDAB in the last 72 hours. Anemia Panel: No results for input(s): VITAMINB12, FOLATE, FERRITIN, TIBC, IRON, RETICCTPCT in  the last 72 hours. Urine analysis:    Component Value Date/Time   COLORURINE YELLOW 03/30/2011 0901   APPEARANCEUR CLEAR 03/30/2011 0901   LABSPEC 1.015 03/30/2011 0901   PHURINE 6.5 03/30/2011 0901   GLUCOSEU NEGATIVE 03/30/2011 0901   HGBUR NEGATIVE 03/30/2011 0901   BILIRUBINUR NEGATIVE 03/30/2011 0901   KETONESUR NEGATIVE 03/30/2011 0901   PROTEINUR NEGATIVE 03/30/2011 0901   UROBILINOGEN 1.0 03/30/2011 0901   NITRITE NEGATIVE 03/30/2011 0901   LEUKOCYTESUR  03/30/2011 0901    NEGATIVE MICROSCOPIC NOT DONE ON URINES WITH NEGATIVE PROTEIN, BLOOD, LEUKOCYTES, NITRITE, OR GLUCOSE <1000 mg/dL.   Sepsis Labs: Invalid input(s): PROCALCITONIN, LACTICIDVEN  Recent Results (from the past 240 hour(s))  MRSA PCR Screening     Status: None   Collection Time: 02/13/17  5:02 PM  Result Value Ref Range Status   MRSA by PCR NEGATIVE NEGATIVE Final    Comment:        The GeneXpert MRSA Assay (FDA approved for NASAL specimens only), is one component of a comprehensive MRSA colonization surveillance program. It is not intended to diagnose MRSA infection nor to guide or monitor treatment for MRSA infections.       Radiology Studies: Dg Chest 2 View  Result Date: 02/13/2017 CLINICAL DATA:  Chest pain, dizziness for 1 week, COPD EXAM: CHEST  2 VIEW COMPARISON:  05/02/2014 FINDINGS: Cardiomediastinal silhouette is stable. Bilateral calcified breast implants again noted. Status post CABG. No infiltrate or pulmonary edema. Mild hyperinflation. Osteopenia and degenerative changes thoracic spine. Right shoulder prosthesis. Extensive degenerative changes left shoulder. IMPRESSION: No active disease. Bilateral calcified breast  implants again noted. Status post CABG. Right shoulder prosthesis. Degenerative changes left shoulder. Electronically Signed   By: Lahoma Crocker M.D.   On: 02/13/2017 11:07     Scheduled Meds: . sodium chloride   Intravenous Once  . atorvastatin  20 mg Oral Daily  . citalopram  20 mg Oral TID  . feeding supplement (ENSURE ENLIVE)  237 mL Oral BID BM  . isosorbide mononitrate  60 mg Oral QHS  . levothyroxine  137 mcg Oral QAC breakfast  . lisinopril  20 mg Oral Daily  . pilocarpine  1 drop Both Eyes BID  . potassium chloride  40 mEq Oral Once  . roflumilast  500 mcg Oral QHS  . traZODone  150 mg Oral QHS  . umeclidinium bromide  1 puff Inhalation Daily   Continuous Infusions:    Marzetta Board, MD, PhD [facility] Pager [phone]  If 7PM-7AM, please contact night-coverage [email or web address] Password [facility] 02/15/2017, 9:16 AM

## 2017-02-16 ENCOUNTER — Encounter (HOSPITAL_COMMUNITY): Payer: Self-pay | Admitting: Gastroenterology

## 2017-02-16 DIAGNOSIS — K921 Melena: Secondary | ICD-10-CM

## 2017-02-16 DIAGNOSIS — K5520 Angiodysplasia of colon without hemorrhage: Secondary | ICD-10-CM

## 2017-02-16 DIAGNOSIS — R195 Other fecal abnormalities: Secondary | ICD-10-CM

## 2017-02-16 LAB — CBC
HCT: 30 % — ABNORMAL LOW (ref 36.0–46.0)
Hemoglobin: 9.4 g/dL — ABNORMAL LOW (ref 12.0–15.0)
MCH: 25.6 pg — ABNORMAL LOW (ref 26.0–34.0)
MCHC: 31.3 g/dL (ref 30.0–36.0)
MCV: 81.7 fL (ref 78.0–100.0)
Platelets: 257 10*3/uL (ref 150–400)
RBC: 3.67 MIL/uL — ABNORMAL LOW (ref 3.87–5.11)
RDW: 17 % — ABNORMAL HIGH (ref 11.5–15.5)
WBC: 7.9 10*3/uL (ref 4.0–10.5)

## 2017-02-16 LAB — BASIC METABOLIC PANEL
Anion gap: 5 (ref 5–15)
BUN: 10 mg/dL (ref 6–20)
CO2: 29 mmol/L (ref 22–32)
Calcium: 8.8 mg/dL — ABNORMAL LOW (ref 8.9–10.3)
Chloride: 106 mmol/L (ref 101–111)
Creatinine, Ser: 0.79 mg/dL (ref 0.44–1.00)
GFR calc Af Amer: >60 mL/min (ref >60)
GFR calc non Af Amer: >60 mL/min (ref >60)
Glucose, Bld: 125 mg/dL — ABNORMAL HIGH (ref 65–99)
Potassium: 3.3 mmol/L — ABNORMAL LOW (ref 3.5–5.1)
Sodium: 140 mmol/L (ref 135–145)

## 2017-02-16 MED ORDER — BISACODYL 5 MG PO TBEC
10.0000 mg | DELAYED_RELEASE_TABLET | Freq: Once | ORAL | Status: AC
  Administered 2017-02-16: 10 mg via ORAL
  Filled 2017-02-16: qty 2

## 2017-02-16 MED ORDER — METOCLOPRAMIDE HCL 5 MG/ML IJ SOLN
10.0000 mg | Freq: Once | INTRAMUSCULAR | Status: AC
  Administered 2017-02-17: 10 mg via INTRAVENOUS
  Filled 2017-02-16: qty 2

## 2017-02-16 MED ORDER — PEG-KCL-NACL-NASULF-NA ASC-C 100 G PO SOLR
0.5000 | Freq: Once | ORAL | Status: AC
  Administered 2017-02-17: 100 g via ORAL
  Filled 2017-02-16: qty 1

## 2017-02-16 MED ORDER — METOCLOPRAMIDE HCL 5 MG/ML IJ SOLN
10.0000 mg | Freq: Once | INTRAMUSCULAR | Status: AC
  Administered 2017-02-16: 10 mg via INTRAVENOUS
  Filled 2017-02-16: qty 2

## 2017-02-16 MED ORDER — PANTOPRAZOLE SODIUM 40 MG PO TBEC
40.0000 mg | DELAYED_RELEASE_TABLET | Freq: Every day | ORAL | Status: DC
  Administered 2017-02-17 – 2017-02-18 (×2): 40 mg via ORAL
  Filled 2017-02-16 (×2): qty 1

## 2017-02-16 MED ORDER — PEG-KCL-NACL-NASULF-NA ASC-C 100 G PO SOLR: 1.0000 | Freq: Once | ORAL | Status: DC

## 2017-02-16 MED ORDER — PEG-KCL-NACL-NASULF-NA ASC-C 100 G PO SOLR
0.5000 | Freq: Once | ORAL | Status: AC
  Administered 2017-02-16: 100 g via ORAL
  Filled 2017-02-16: qty 1

## 2017-02-16 NOTE — Progress Notes (Signed)
          Daily Rounding Note  02/16/2017, 8:22 AM  LOS: 2 days   SUBJECTIVE:   Chief complaint: dark stools, these persist.   Pt feeling stronger.  Walking without issues.  No palpitations or chest pain.  No dyspnea or dizziness.    OBJECTIVE:         Vital signs in last 24 hours:    Temp:  [97.8 F (36.6 C)-98.9 F (37.2 C)] 98.9 F (37.2 C) (02/26 0437) Pulse Rate:  [83-99] 91 (02/26 0437) Resp:  [18-22] 18 (02/26 0437) BP: (94-122)/(50-83) 122/62 (02/26 0437) SpO2:  [92 %-100 %] 92 % (02/26 0724) Weight:  [75.3 kg (166 lb)] 75.3 kg (166 lb) (02/25 0837) Last BM Date: 02/15/17 Filed Weights   02/13/17 1637 02/15/17 0252 02/15/17 0837  Weight: 76 kg (167 lb 9.8 oz) 75.4 kg (166 lb 3.2 oz) 75.3 kg (166 lb)   General: looks well   Heart: RRR Chest: clear bil.  No dyspnea Abdomen: soft, NT, ND.  Active BS  Extremities: no CCE Neuro/Psych:  Oriented x 3.  Fully alert.  No defecits, no tremors  Intake/Output from previous day: 02/25 0701 - 02/26 0700 In: 1062 [P.O.:660; Blood:402] Out: -   Intake/Output this shift: No intake/output data recorded.  Lab Results:  Recent Labs  02/13/17 1045 02/14/17 0232 02/15/17 0210 02/15/17 1936  WBC 11.6* 10.8* 8.8  --   HGB 6.4* 7.1* 7.9* 9.1*  HCT 21.4* 22.8* 24.9* 29.1*  PLT 288 268 245  --    BMET  Recent Labs  02/13/17 1045 02/14/17 0232 02/15/17 0210  NA 141 141 142  K 2.8* 3.6 3.2*  CL 98* 103 106  CO2 29 30 30   GLUCOSE 157* 102* 100*  BUN 24* 24* 18  CREATININE 0.89 0.82 0.73  CALCIUM 8.9 8.8* 8.7*   LFT  Recent Labs  02/13/17 1148  PROT 6.1*  ALBUMIN 3.0*  AST 18  ALT 9*  ALKPHOS 68  BILITOT 0.7  BILIDIR 0.1  IBILI 0.6   PT/INR  Recent Labs  02/13/17 1148  LABPROT 14.9  INR 1.16   Hepatitis Panel No results for input(s): HEPBSAG, HCVAB, HEPAIGM, HEPBIGM in the last 72 hours.  Studies/Results: No results found.  ASSESMENT:   *   Melenic stool.  2/24 EGD: Schatzki ring.  non-bleeding erosive gastropathy at cardia. Pancreatic rest.   Capsule endo 2/25: no reading yet.   Colonoscopy and EGD with Dr Melina Copa within last 12 months, reported by pt as unremarkable.    *  Acute blood loss anemia on top of chronic anemia.   Good response to PRBC x 5.  Started on po iron week of 2/19.   *  Chronic xarelto. stoppped week of 02/02/17  *  Hypokalemia.     PLAN   *  Await reading of cap endo.  Added once daily Protonix PO    Azucena Freed  02/16/2017, 8:22 AM Pager: [phone]

## 2017-02-16 NOTE — Op Note (Signed)
Small bowel video capsule endoscopy  Complete study. Preparation adequate.  Views fair in the distal ileum due to gas and debris.  Gastric transit time 2 minutes Small bowel transit time 4 hours, 10 min.  Findings: 1. Gastritis with erosions.  No active bleeding. 2. Duodenal bulb probable pancreatic rest (seen recently at EGD) 3. Multiple, small, angiodysplastic lesions seen in the small bowel.  First lesion seen at 14 minutes (distal duodenal versus proximal jejenum) and last lesion seen at 3 hours, 47 minutes (ileum).    No active bleeding seen in the small bowel during this study.  Very limited views of the colon without evidence of active bleeding/red blood.  Summary and Recommendations 1. Gastritis 2. Scattered small bowel angiodysplastic lesions without active bleeding during this study  1. BID PPI 2. Most likely source for recent GI bleeding is small bowel angiodysplastic lesions.  This is certainly exacerbated by anticoagulation therapy.   The majority of these lesions are likely not reachable by standard small bowel enteroscopy.  I would recommend consideration of IVC filter rather than ongoing anticoagulation.  If anticoagulation is absolutely necessary would consider Eliquis rather than Xarelto (anecdotal evidence of less bleeding from angiodysplastic lesions). 3. Replace iron IV if tolerated (ferritin not checked prior to transfusion, but MCV low on admission). 4. Monitor Hgb closely  Full report given to the patient and copy to be scanned into electronic medical record

## 2017-02-16 NOTE — Progress Notes (Signed)
Patient ID: [name], female   DOB: [birth year], 76 y.o.   MRN: [id number]   IR aware of IVC filter consideration request.  MD discussing with Hematology Will let us know if moving forward

## 2017-02-16 NOTE — Consult Note (Signed)
[facility] CM Primary Care Navigator  02/16/2017  [name] 1941/05/29 [id number]  Met with patient at the bedside to identify possible discharge needs. Patient reports being "so weak" and rectal bleeding that had led to this admission.  Patient endorses Dr. Vassie Loll Eyk with [facility] as her primary care provider.    Patient shared using [facility] in [place] to obtain medications without any problem.  Patient states managing her medications at home using "pill box" system weekly.   Patient is able to drive prior to admission, however, daughter [name]) will be able to provide transportation to her doctors' appointments if needed after discharge.  She lives alone and was independent with self care prior to admission. Her son [name]- lives nearby) will be her primary caregiver at home and daughter can be of help as well per patient..   Discharge plan is home with children's support and assistance per patient.  Patient voiced understanding to call primary care provider's office when she gets home, for a post discharge follow-up appointment within a week or sooner if needs arise. Patient letter (with PCP's contact number) was provided as her reminder.  Discussed with patient regarding [facility] care management services available for her health management and she opted to receive [facility] calls to help her manage COPD at home.  Referral was made for [facility] COPD calls after discharge.  For additional questions please contact:  Edwena Felty A. Brydon Spahr, BSN, RN-BC [facility] PRIMARY CARE Navigator Cell: [phone]

## 2017-02-16 NOTE — Progress Notes (Signed)
PROGRESS NOTE  [name] [id number] DOB: [birth year] DOA: 02/13/2017 PCP: [name], MD   LOS: 2 days   Brief Narrative: [name] is a 76 y.o. female with medical history significant of CAD, s/p CABG,, CHF, PVD, COPD, DVT (has been on Xarelto till last week and it was discontinued d/t ongoing chest apin and plan to have an invasive procedure), HTN, hyperthyroidism, and anemia who presented to the ED per referral of her PCP. Patient has been having chest pain during the last three days and weakness. Patient had her blood work done by PCP and today received the phone call with instructions to go to the ED for evaluation d/t anemia. On admission patient also c/o some dark stools and epigastric pain.  Gastroenterology was consulted, and patient underwent an EGD on 2/23 without explanation for her bleeding.  She underwent a capsule study on 2/24  Assessment & Plan: Principal Problem:   GI bleed Active Problems:   Hypothyroidism   Essential hypertension   COPD (chronic obstructive pulmonary disease) (HCC)   CAD (coronary artery disease)   Dyslipidemia   Chest pain   Blood loss anemia   Malnutrition of moderate degree   Acute blood loss anemia due to probable GI bleeding, symptomatic -Gastroenterology consulted, appreciate input, patient underwent an EGD on 2/24 which showed moderate Schatzki ring, nonbleeding erosive gastropathy and normal duodenum. -She has received a total of 5 units packed red blood cells so far, her hemoglobin was 9.1 last night, morning CBC is pending. -Finished capsule study last night, really spending this morning.  Chest pain/shortness of breath -Progressive, this is in the setting of coronary artery disease, however most likely explained by her progressive anemia and ongoing GI bleed.  -She feels an improvement after transfusions  COPD -Stable, no wheezing, continue home medications  Coronary artery disease status post CABG -We chest pain,  which is most likely due to symptomatic anemia, she is chest pain-free, feels improved after transfusions  Hypokalemia -Probably related to GI losses overnight, replete and repeat this morning is pending  Prior DVT 2 -Xarelto is currently on hold, will have to resume versus placement of IVC filter if she will be deemed not a candidate for anticoagulation anymore based on gastroenterology evaluation, however she is actively bleeding even off of xarelto  Hypertension -Blood pressure currently controlled, continue Imdur, continue lisinopril  Hyperlipidemia -Continue statin  Hypothyroidism -Continue Synthroid    DVT prophylaxis: SCDs Code Status: Full code Family Communication: No family at bedside Disposition Plan: To be determined  Consultants:   Gastroenterology  Procedures:   EGD 2/24  Impression:       - Moderate Schatzki ring.                           - Non-bleeding erosive gastropathy.                           - Normal examined duodenum.                           - No specimens collected.  Antimicrobials:  None    Subjective: -Complains of still ongoing blood in her stool. No chest pain, no shortness of breath  Objective: Vitals:   02/15/17 1818 02/15/17 2000 02/16/17 0437 02/16/17 0724  BP: (!) 94/56 (!) 104/50 122/62   Pulse: 83 85 91  Resp:  18 18   Temp: 97.8 F (36.6 C) 97.9 F (36.6 C) 98.9 F (37.2 C)   TempSrc: Oral Oral Oral   SpO2: 95% 96% 94% 92%  Weight:      Height:        Intake/Output Summary (Last 24 hours) at 02/16/17 0908 Last data filed at 02/15/17 1818  Gross per 24 hour  Intake             1062 ml  Output                0 ml  Net             1062 ml   Filed Weights   02/13/17 1637 02/15/17 0252 02/15/17 0837  Weight: 76 kg (167 lb 9.8 oz) 75.4 kg (166 lb 3.2 oz) 75.3 kg (166 lb)    Examination: Constitutional: NAD Vitals:   02/15/17 1818 02/15/17 2000 02/16/17 0437 02/16/17 0724  BP: (!) 94/56 (!) 104/50 122/62     Pulse: 83 85 91   Resp:  18 18   Temp: 97.8 F (36.6 C) 97.9 F (36.6 C) 98.9 F (37.2 C)   TempSrc: Oral Oral Oral   SpO2: 95% 96% 94% 92%  Weight:      Height:       Eyes: PERRL, lids and conjunctivae normal ENMT: Mucous membranes are dry Respiratory: clear to auscultation bilaterally, no wheezing, no crackles. Normal respiratory effort.  Cardiovascular: Regular rate and rhythm, no murmurs / rubs / gallops. No LE edema. 2+ pedal pulses.  Abdomen: no tenderness. Bowel sounds positive.  Musculoskeletal: no clubbing / cyanosis.  Neurologic: CN 2-12 grossly intact. Strength 5/5 in all 4.  Psychiatric: Alert and oriented x 3.     Data Reviewed: I have personally reviewed following labs and imaging studies  CBC:  Recent Labs Lab 02/13/17 1045 02/14/17 0232 02/15/17 0210 02/15/17 1936  WBC 11.6* 10.8* 8.8  --   HGB 6.4* 7.1* 7.9* 9.1*  HCT 21.4* 22.8* 24.9* 29.1*  MCV 75.9* 78.9 80.3  --   PLT 288 268 245  --    Basic Metabolic Panel:  Recent Labs Lab 02/13/17 1045 02/14/17 0232 02/15/17 0210  NA 141 141 142  K 2.8* 3.6 3.2*  CL 98* 103 106  CO2 29 30 30   GLUCOSE 157* 102* 100*  BUN 24* 24* 18  CREATININE 0.89 0.82 0.73  CALCIUM 8.9 8.8* 8.7*   Liver Function Tests:  Recent Labs Lab 02/13/17 1148  AST 18  ALT 9*  ALKPHOS 68  BILITOT 0.7  PROT 6.1*  ALBUMIN 3.0*   Coagulation Profile:  Recent Labs Lab 02/13/17 1148  INR 1.16   BNP (last 3 results)  Recent Labs  02/06/17 1558  PROBNP 508   Urine analysis:    Component Value Date/Time   COLORURINE YELLOW 03/30/2011 0901   APPEARANCEUR CLEAR 03/30/2011 0901   LABSPEC 1.015 03/30/2011 0901   PHURINE 6.5 03/30/2011 0901   GLUCOSEU NEGATIVE 03/30/2011 0901   HGBUR NEGATIVE 03/30/2011 0901   BILIRUBINUR NEGATIVE 03/30/2011 0901   KETONESUR NEGATIVE 03/30/2011 0901   PROTEINUR NEGATIVE 03/30/2011 0901   UROBILINOGEN 1.0 03/30/2011 0901   NITRITE NEGATIVE 03/30/2011 0901   LEUKOCYTESUR   03/30/2011 0901    NEGATIVE MICROSCOPIC NOT DONE ON URINES WITH NEGATIVE PROTEIN, BLOOD, LEUKOCYTES, NITRITE, OR GLUCOSE <1000 mg/dL.   Sepsis Labs: Invalid input(s): PROCALCITONIN, LACTICIDVEN  Recent Results (from the past 240 hour(s))  MRSA PCR Screening  Status: None   Collection Time: 02/13/17  5:02 PM  Result Value Ref Range Status   MRSA by PCR NEGATIVE NEGATIVE Final    Comment:        The GeneXpert MRSA Assay (FDA approved for NASAL specimens only), is one component of a comprehensive MRSA colonization surveillance program. It is not intended to diagnose MRSA infection nor to guide or monitor treatment for MRSA infections.      Radiology Studies: No results found.  Scheduled Meds: . atorvastatin  20 mg Oral Daily  . citalopram  20 mg Oral TID  . feeding supplement (ENSURE ENLIVE)  237 mL Oral BID BM  . isosorbide mononitrate  60 mg Oral QHS  . levothyroxine  137 mcg Oral QAC breakfast  . lisinopril  20 mg Oral Daily  . pilocarpine  1 drop Both Eyes BID  . roflumilast  500 mcg Oral QHS  . traZODone  150 mg Oral QHS  . umeclidinium bromide  1 puff Inhalation Daily   Continuous Infusions:  Marzetta Board, MD, PhD [facility] Pager [phone]  If 7PM-7AM, please contact night-coverage [email or web address] Password [email or web address] 02/16/2017, 9:08 AM

## 2017-02-17 ENCOUNTER — Inpatient Hospital Stay (HOSPITAL_COMMUNITY): Payer: Medicare Other

## 2017-02-17 ENCOUNTER — Encounter (HOSPITAL_COMMUNITY)
Admission: EM | Disposition: A | Discharge status: Home / Self Care | Payer: Self-pay | Source: Home / Self Care | Attending: Internal Medicine

## 2017-02-17 ENCOUNTER — Encounter (HOSPITAL_COMMUNITY): Payer: Self-pay | Admitting: *Deleted

## 2017-02-17 ENCOUNTER — Inpatient Hospital Stay (HOSPITAL_COMMUNITY): Payer: Medicare Other | Admitting: Anesthesiology

## 2017-02-17 DIAGNOSIS — R609 Edema, unspecified: Secondary | ICD-10-CM

## 2017-02-17 HISTORY — PX: COLONOSCOPY WITH PROPOFOL: SHX5780

## 2017-02-17 LAB — TYPE AND SCREEN
Blood Product Expiration Date: 201803022359
Blood Product Expiration Date: 201803212359
Blood Product Expiration Date: 201803222359
Blood Product Expiration Date: 201803242359
Blood Product Expiration Date: 201803242359
Blood Product Expiration Date: 201803272359
ISSUE DATE / TIME: 201802231237
ISSUE DATE / TIME: 201802231607
ISSUE DATE / TIME: 201802240841
ISSUE DATE / TIME: 201802241214
ISSUE DATE / TIME: 201802251123
ISSUE DATE / TIME: 201802251521
Unit Type and Rh: 5100
Unit Type and Rh: 5100
Unit Type and Rh: 5100
Unit Type and Rh: 5100
Unit Type and Rh: 5100
Unit Type and Rh: 5100

## 2017-02-17 LAB — BASIC METABOLIC PANEL
Anion gap: 7 (ref 5–15)
BUN: 8 mg/dL (ref 6–20)
CO2: 27 mmol/L (ref 22–32)
Calcium: 8.8 mg/dL — ABNORMAL LOW (ref 8.9–10.3)
Chloride: 108 mmol/L (ref 101–111)
Creatinine, Ser: 0.72 mg/dL (ref 0.44–1.00)
GFR calc Af Amer: >60 mL/min (ref >60)
GFR calc non Af Amer: >60 mL/min (ref >60)
Glucose, Bld: 99 mg/dL (ref 65–99)
Potassium: 3.2 mmol/L — ABNORMAL LOW (ref 3.5–5.1)
Sodium: 142 mmol/L (ref 135–145)

## 2017-02-17 LAB — CBC
HCT: 30 % — ABNORMAL LOW (ref 36.0–46.0)
Hemoglobin: 9.5 g/dL — ABNORMAL LOW (ref 12.0–15.0)
MCH: 26 pg (ref 26.0–34.0)
MCHC: 31.7 g/dL (ref 30.0–36.0)
MCV: 82 fL (ref 78.0–100.0)
Platelets: 273 10*3/uL (ref 150–400)
RBC: 3.66 MIL/uL — ABNORMAL LOW (ref 3.87–5.11)
RDW: 17.2 % — ABNORMAL HIGH (ref 11.5–15.5)
WBC: 8.6 10*3/uL (ref 4.0–10.5)

## 2017-02-17 LAB — IRON AND TIBC
Iron: 11 ug/dL — ABNORMAL LOW (ref 28–170)
Saturation Ratios: 3 % — ABNORMAL LOW (ref 10.4–31.8)
TIBC: 353 ug/dL (ref 250–450)
UIBC: 342 ug/dL

## 2017-02-17 LAB — FERRITIN: Ferritin: 15 ng/mL (ref 11–307)

## 2017-02-17 SURGERY — COLONOSCOPY WITH PROPOFOL
Anesthesia: Monitor Anesthesia Care

## 2017-02-17 MED ORDER — LIDOCAINE HCL (CARDIAC) 20 MG/ML IV SOLN
INTRAVENOUS | Status: DC | PRN
  Administered 2017-02-17: 60 mg via INTRATRACHEAL

## 2017-02-17 MED ORDER — PROPOFOL 500 MG/50ML IV EMUL
INTRAVENOUS | Status: DC | PRN
  Administered 2017-02-17: 100 ug/kg/min via INTRAVENOUS

## 2017-02-17 MED ORDER — SODIUM CHLORIDE 0.9 % IV SOLN
1000.0000 mg | Freq: Once | INTRAVENOUS | Status: AC
  Administered 2017-02-17: 1000 mg via INTRAVENOUS
  Filled 2017-02-17 (×2): qty 20

## 2017-02-17 MED ORDER — SODIUM CHLORIDE 0.9 % IV SOLN
25.0000 mg | Freq: Once | INTRAVENOUS | Status: AC
  Administered 2017-02-17: 25 mg via INTRAVENOUS
  Filled 2017-02-17: qty 0.5

## 2017-02-17 MED ORDER — PHENYLEPHRINE HCL 10 MG/ML IJ SOLN
INTRAMUSCULAR | Status: DC | PRN
  Administered 2017-02-17: 120 ug via INTRAVENOUS
  Administered 2017-02-17: 80 ug via INTRAVENOUS

## 2017-02-17 MED ORDER — SODIUM CHLORIDE 0.9 % IV SOLN
INTRAVENOUS | Status: DC
  Administered 2017-02-17: 02:00:00 via INTRAVENOUS

## 2017-02-17 MED ORDER — PROPOFOL 10 MG/ML IV BOLUS
INTRAVENOUS | Status: DC | PRN
  Administered 2017-02-17: 15 mg via INTRAVENOUS
  Administered 2017-02-17: 10 mg via INTRAVENOUS
  Administered 2017-02-17 (×2): 15 mg via INTRAVENOUS
  Administered 2017-02-17: 10 mg via INTRAVENOUS

## 2017-02-17 NOTE — Anesthesia Procedure Notes (Signed)
Procedure Name: MAC Date/Time: 02/17/2017 11:45 AM Performed by: Salli Quarry Shep Porter Pre-anesthesia Checklist: Patient identified, Emergency Drugs available, Suction available, Patient being monitored and Timeout performed Oxygen Delivery Method: Nasal cannula

## 2017-02-17 NOTE — Interval H&P Note (Signed)
History and Physical Interval Note: For colonoscopy today to eval GI bleeding/hematochezia with MAC Tolerated prep, "just a little" blood per rectum with the prep Hgb stable today The nature of the procedure, as well as the risks, benefits, and alternatives were carefully and thoroughly reviewed with the patient. Ample time for discussion and questions allowed. The patient understood, was satisfied, and agreed to proceed.     CBC Latest Ref Rng & Units 02/17/2017 02/16/2017 02/15/2017  WBC 4.0 - 10.5 K/uL 8.6 7.9 -  Hemoglobin 12.0 - 15.0 g/dL 9.5(L) 9.4(L) 9.1(L)  Hematocrit 36.0 - 46.0 % 30.0(L) 30.0(L) 29.1(L)  Platelets 150 - 400 K/uL 273 257 -      02/17/2017 10:49 AM  [name]  has presented today for surgery, with the diagnosis of anemia and black and clotted bloody stools  The various methods of treatment have been discussed with the patient and family. After consideration of risks, benefits and other options for treatment, the patient has consented to  Procedure(s): COLONOSCOPY WITH PROPOFOL (N/A) as a surgical intervention .  The patient's history has been reviewed, patient examined, no change in status, stable for surgery.  I have reviewed the patient's chart and labs.  Questions were answered to the patient's satisfaction.     PYRTLE, JAY M

## 2017-02-17 NOTE — H&P (View-Only) (Signed)
          Daily Rounding Note  02/16/2017, 8:22 AM  LOS: 2 days   SUBJECTIVE:   Chief complaint: dark stools, these persist.   Pt feeling stronger.  Walking without issues.  No palpitations or chest pain.  No dyspnea or dizziness.    OBJECTIVE:         Vital signs in last 24 hours:    Temp:  [97.8 F (36.6 C)-98.9 F (37.2 C)] 98.9 F (37.2 C) (02/26 0437) Pulse Rate:  [83-99] 91 (02/26 0437) Resp:  [18-22] 18 (02/26 0437) BP: (94-122)/(50-83) 122/62 (02/26 0437) SpO2:  [92 %-100 %] 92 % (02/26 0724) Weight:  [75.3 kg (166 lb)] 75.3 kg (166 lb) (02/25 0837) Last BM Date: 02/15/17 Filed Weights   02/13/17 1637 02/15/17 0252 02/15/17 0837  Weight: 76 kg (167 lb 9.8 oz) 75.4 kg (166 lb 3.2 oz) 75.3 kg (166 lb)   General: looks well   Heart: RRR Chest: clear bil.  No dyspnea Abdomen: soft, NT, ND.  Active BS  Extremities: no CCE Neuro/Psych:  Oriented x 3.  Fully alert.  No defecits, no tremors  Intake/Output from previous day: 02/25 0701 - 02/26 0700 In: 1062 [P.O.:660; Blood:402] Out: -   Intake/Output this shift: No intake/output data recorded.  Lab Results:  Recent Labs  02/13/17 1045 02/14/17 0232 02/15/17 0210 02/15/17 1936  WBC 11.6* 10.8* 8.8  --   HGB 6.4* 7.1* 7.9* 9.1*  HCT 21.4* 22.8* 24.9* 29.1*  PLT 288 268 245  --    BMET  Recent Labs  02/13/17 1045 02/14/17 0232 02/15/17 0210  NA 141 141 142  K 2.8* 3.6 3.2*  CL 98* 103 106  CO2 29 30 30   GLUCOSE 157* 102* 100*  BUN 24* 24* 18  CREATININE 0.89 0.82 0.73  CALCIUM 8.9 8.8* 8.7*   LFT  Recent Labs  02/13/17 1148  PROT 6.1*  ALBUMIN 3.0*  AST 18  ALT 9*  ALKPHOS 68  BILITOT 0.7  BILIDIR 0.1  IBILI 0.6   PT/INR  Recent Labs  02/13/17 1148  LABPROT 14.9  INR 1.16   Hepatitis Panel No results for input(s): HEPBSAG, HCVAB, HEPAIGM, HEPBIGM in the last 72 hours.  Studies/Results: No results found.  ASSESMENT:   *   Melenic stool.  2/24 EGD: Schatzki ring.  non-bleeding erosive gastropathy at cardia. Pancreatic rest.   Capsule endo 2/25: no reading yet.   Colonoscopy and EGD with Dr Melina Copa within last 12 months, reported by pt as unremarkable.    *  Acute blood loss anemia on top of chronic anemia.   Good response to PRBC x 5.  Started on po iron week of 2/19.   *  Chronic xarelto. stoppped week of 02/02/17  *  Hypokalemia.     PLAN   *  Await reading of cap endo.  Added once daily Protonix PO    Azucena Freed  02/16/2017, 8:22 AM Pager: [phone]

## 2017-02-17 NOTE — Progress Notes (Signed)
PROGRESS NOTE  DANIELL HOUSEWRIGHT [id number] DOB: Dec 17, 1941 DOA: 02/13/2017 PCP: [name], MD   LOS: 3 days   Brief Narrative: [name] is a 76 y.o. female with medical history significant of CAD, s/p CABG,, CHF, PVD, COPD, DVT (has been on Xarelto till last week and it was discontinued d/t ongoing chest apin and plan to have an invasive procedure), HTN, hyperthyroidism, and anemia who presented to the ED per referral of her PCP. Patient has been having chest pain during the last three days and weakness. Patient had her blood work done by PCP and today received the phone call with instructions to go to the ED for evaluation d/t anemia. On admission patient also c/o some dark stools and epigastric pain.  Gastroenterology was consulted, and patient underwent an EGD on 2/23 without explanation for her bleeding.  She underwent a capsule study on 2/24 which showed several small bowel AVMs.  She also underwent a colonoscopy on 2/27 which showed severe diverticulosis.  Assessment & Plan: Principal Problem:   GI bleed Active Problems:   Hypothyroidism   Essential hypertension   COPD (chronic obstructive pulmonary disease) (HCC)   CAD (coronary artery disease)   Dyslipidemia   Chest pain   Blood loss anemia   Malnutrition of moderate degree   Heme positive stool   Angiodysplasia of intestine   Hematochezia   Acute blood loss anemia due to probable GI bleeding, symptomatic -Gastroenterology consulted, appreciate input, patient underwent an EGD on 2/24 which showed moderate Schatzki ring, nonbleeding erosive gastropathy and normal duodenum. -She has received a total of 5 units packed red blood cells so far, her hemoglobin has remained stable  in the last couple of days, suggesting clinically that her bleeding has stopped -Finished capsule study which showed several AVMs in the small bowel, scattered -Colonoscopy done on 2/27 shows severe diverticulosis without any active diverticular  bleeding -Gastroenterology recommends against anticoagulation given small bowel AVMs.  Chest pain/shortness of breath -Progressive, this is in the setting of coronary artery disease, however most likely explained by her progressive anemia and ongoing GI bleed.  -She feels an improvement after transfusions  COPD -Stable, no wheezing, continue home medications  Coronary artery disease status post CABG -We chest pain, which is most likely due to symptomatic anemia, she is chest pain-free, feels improved after transfusions  Hypokalemia -Probably related to GI losses overnight, replete and repeat this morning is pending  Prior DVT 2 -Xarelto is currently on hold and will need to be held on discharge. -Guidelines are not very clear whether she is a candidate for IVC filter at this point.  She has had 2 prior DVTs which did require lifelong anticoagulation, however most recent DVT was in 2011.  Without lower extremity DVT, and no prior history of PE, she has no clear indication for IVC placement at this point.  I have discussed the case with interventional radiology, who recommended case to be discussed further with hematology.  I have discussed with hematology as well, and this is somewhat of a gray area.  Without clear indication for IVC filter, this cannot really be placed right now as the procedure itself as well as presence of the filter long-term has its own complications.   -I think the safest way is for patient to come off of anticoagulation currently, and she will need referral to hematology as an outpatient, she can follow-up here in town or in [place] with hematologist of her PCPs choice within 1-2 weeks.  She can either go without any anticoagulation and have regular follow-up to evaluate for presence of thrombi versus low-dose Eliquis for DVT prevention with slow monitoring for further bleeding.  I will defer final decision as to which pathways best for the patient to PCP in discussion  with hematology as an outpatient -With that being said, I will repeat a lower extremity Doppler study to reevaluate.  If it is positive for DVT, then there is a clear indication for IVC placement and will have to be placed during this hospitalization  Hypertension -Blood pressure currently controlled, continue Imdur, continue lisinopril  Hyperlipidemia -Continue statin  Hypothyroidism -Continue Synthroid    DVT prophylaxis: SCDs Code Status: Full code Family Communication: No family at bedside Disposition Plan: home in 1 day if Hb stable  Consultants:   Gastroenterology  Procedures:   Colonoscopy 2/27 Impression:       - Severe diverticulosis from sigmoid to ascending colon. There was narrowing of the colon in association with the diverticular opening. There was no evidence of active diverticular bleeding. With recent information, diverticular bleeding is felt to most likely explain recent hematochezia. Iron deficiency in the setting of anticoagulation s likely in part to slow GI blood loss from  angiodysplastic lesions in the small bowel.                           - Pill cam in the ascending colon.                           - One 3 to 4 mm polyp in the rectum. Resection not attempted due to benign nature of this polyp and recent bleeding.                           - Small non-bleeding external hemorrhoids.                           - No specimens collected.  EGD 2/24  Impression:       - Moderate Schatzki ring.                           - Non-bleeding erosive gastropathy.                           - Normal examined duodenum.                           - No specimens collected.  Antimicrobials:  None    Subjective: - no further blood in her stool  Objective: Vitals:   02/17/17 1240 02/17/17 1245 02/17/17 1250 02/17/17 1312  BP: (!) 131/54 (!) 118/40 (!) 131/39 (!) 147/63  Pulse: 86 84 86 85  Resp: 16 19 (!) 25 16  Temp:    98.6 F (37 C)  TempSrc:    Oral  SpO2:  100% 100% 100% 96%  Weight:      Height:        Intake/Output Summary (Last 24 hours) at 02/17/17 1535 Last data filed at 02/17/17 1300  Gross per 24 hour  Intake          3028.33 ml  Output  0 ml  Net          3028.33 ml   Filed Weights   02/13/17 1637 02/15/17 0252 02/15/17 0837  Weight: 76 kg (167 lb 9.8 oz) 75.4 kg (166 lb 3.2 oz) 75.3 kg (166 lb)    Examination: Constitutional: NAD Vitals:   02/17/17 1240 02/17/17 1245 02/17/17 1250 02/17/17 1312  BP: (!) 131/54 (!) 118/40 (!) 131/39 (!) 147/63  Pulse: 86 84 86 85  Resp: 16 19 (!) 25 16  Temp:    98.6 F (37 C)  TempSrc:    Oral  SpO2: 100% 100% 100% 96%  Weight:      Height:       Eyes: PERRL, lids and conjunctivae normal ENMT: Mucous membranes are dry Respiratory: clear to auscultation bilaterally, no wheezing, no crackles. Normal respiratory effort.  Cardiovascular: Regular rate and rhythm, no murmurs / rubs / gallops. No LE edema. 2+ pedal pulses.  Abdomen: no tenderness. Bowel sounds positive.  Musculoskeletal: no clubbing / cyanosis.  Neurologic: CN 2-12 grossly intact. Strength 5/5 in all 4.  Psychiatric: Alert and oriented x 3.     Data Reviewed: I have personally reviewed following labs and imaging studies  CBC:  Recent Labs Lab 02/13/17 1045 02/14/17 0232 02/15/17 0210 02/15/17 1936 02/16/17 0951 02/17/17 0208  WBC 11.6* 10.8* 8.8  --  7.9 8.6  HGB 6.4* 7.1* 7.9* 9.1* 9.4* 9.5*  HCT 21.4* 22.8* 24.9* 29.1* 30.0* 30.0*  MCV 75.9* 78.9 80.3  --  81.7 82.0  PLT 288 268 245  --  257 [id number]   Basic Metabolic Panel:  Recent Labs Lab 02/13/17 1045 02/14/17 0232 02/15/17 0210 02/16/17 0951 02/17/17 0208  NA 141 141 142 140 142  K 2.8* 3.6 3.2* 3.3* 3.2*  CL 98* 103 106 106 108  CO2 29 30 30 29 27   GLUCOSE 157* 102* 100* 125* 99  BUN 24* 24* 18 10 8   CREATININE 0.89 0.82 0.73 0.79 0.72  CALCIUM 8.9 8.8* 8.7* 8.8* 8.8*   Liver Function Tests:  Recent Labs Lab  02/13/17 1148  AST 18  ALT 9*  ALKPHOS 68  BILITOT 0.7  PROT 6.1*  ALBUMIN 3.0*   Coagulation Profile:  Recent Labs Lab 02/13/17 1148  INR 1.16   BNP (last 3 results)  Recent Labs  02/06/17 1558  PROBNP 508   Urine analysis:    Component Value Date/Time   COLORURINE YELLOW 03/30/2011 0901   APPEARANCEUR CLEAR 03/30/2011 0901   LABSPEC 1.015 03/30/2011 0901   PHURINE 6.5 03/30/2011 0901   GLUCOSEU NEGATIVE 03/30/2011 0901   HGBUR NEGATIVE 03/30/2011 0901   BILIRUBINUR NEGATIVE 03/30/2011 0901   KETONESUR NEGATIVE 03/30/2011 0901   PROTEINUR NEGATIVE 03/30/2011 0901   UROBILINOGEN 1.0 03/30/2011 0901   NITRITE NEGATIVE 03/30/2011 0901   LEUKOCYTESUR  03/30/2011 0901    NEGATIVE MICROSCOPIC NOT DONE ON URINES WITH NEGATIVE PROTEIN, BLOOD, LEUKOCYTES, NITRITE, OR GLUCOSE <1000 mg/dL.   Sepsis Labs: Invalid input(s): PROCALCITONIN, LACTICIDVEN  Recent Results (from the past 240 hour(s))  MRSA PCR Screening     Status: None   Collection Time: 02/13/17  5:02 PM  Result Value Ref Range Status   MRSA by PCR NEGATIVE NEGATIVE Final    Comment:        The GeneXpert MRSA Assay (FDA approved for NASAL specimens only), is one component of a comprehensive MRSA colonization surveillance program. It is not intended to diagnose MRSA infection nor to guide or monitor treatment for MRSA  infections.      Radiology Studies: No results found.  Scheduled Meds: . atorvastatin  20 mg Oral Daily  . citalopram  20 mg Oral TID  . feeding supplement (ENSURE ENLIVE)  237 mL Oral BID BM  . iron dextran (INFED/DEXFERRUM) infusion  25 mg Intravenous Once   Followed by  . iron dextran (INFED/DEXFERRUM) infusion  1,000 mg Intravenous Once  . isosorbide mononitrate  60 mg Oral QHS  . levothyroxine  137 mcg Oral QAC breakfast  . lisinopril  20 mg Oral Daily  . pantoprazole  40 mg Oral Q0600  . pilocarpine  1 drop Both Eyes BID  . roflumilast  500 mcg Oral QHS  . traZODone  150  mg Oral QHS  . umeclidinium bromide  1 puff Inhalation Daily   Continuous Infusions:  Marzetta Board, MD, PhD [facility] Pager [phone]  If 7PM-7AM, please contact night-coverage [email or web address] Password [facility] 02/17/2017, 3:35 PM

## 2017-02-17 NOTE — Transfer of Care (Signed)
Immediate Anesthesia Transfer of Care Note  Patient: [name]  Procedure(s) Performed: Procedure(s): COLONOSCOPY WITH PROPOFOL (N/A)  Patient Location: Endoscopy Unit  Anesthesia Type:MAC  Level of Consciousness: sedated and patient cooperative  Airway & Oxygen Therapy: Patient Spontanous Breathing and Patient connected to nasal cannula oxygen  Post-op Assessment: Report given to RN and Post -op Vital signs reviewed and stable  Post vital signs: Reviewed and stable  Last Vitals:  Vitals:   02/17/17 0951 02/17/17 1021  BP: (!) 150/72 (!) 145/61  Pulse: 98 99  Resp:  20  Temp:  37.2 C    Last Pain:  Vitals:   02/17/17 1021  TempSrc: Oral  PainSc:       Patients Stated Pain Goal: 2 (02/58/52 7782)  Complications: No apparent anesthesia complications

## 2017-02-17 NOTE — Anesthesia Preprocedure Evaluation (Addendum)
Anesthesia Evaluation  Patient identified by MRN, date of birth, ID band Patient awake    Reviewed: Allergy & Precautions, H&P , NPO status , Patient's Chart, lab work & pertinent test results  Airway Mallampati: II  TM Distance: >3 FB Neck ROM: Full    Dental no notable dental hx. (+) Upper Dentures, Lower Dentures, Dental Advisory Given   Pulmonary sleep apnea , COPD,  COPD inhaler, former smoker,    Pulmonary exam normal breath sounds clear to auscultation       Cardiovascular hypertension, Pt. on medications + CAD, + Cardiac Stents, + CABG and + Peripheral Vascular Disease   Rhythm:Regular Rate:Normal     Neuro/Psych Depression negative neurological ROS     GI/Hepatic Neg liver ROS, GERD  Medicated and Controlled,  Endo/Other  Hypothyroidism   Renal/GU negative Renal ROS  negative genitourinary   Musculoskeletal  (+) Arthritis , Osteoarthritis,    Abdominal   Peds  Hematology negative hematology ROS (+) anemia ,   Anesthesia Other Findings   Reproductive/Obstetrics negative OB ROS                            Anesthesia Physical Anesthesia Plan  ASA: III  Anesthesia Plan: MAC   Post-op Pain Management:    Induction: Intravenous  Airway Management Planned: Simple Face Mask  Additional Equipment:   Intra-op Plan:   Post-operative Plan:   Informed Consent: I have reviewed the patients History and Physical, chart, labs and discussed the procedure including the risks, benefits and alternatives for the proposed anesthesia with the patient or authorized representative who has indicated his/her understanding and acceptance.   Dental advisory given  Plan Discussed with: CRNA  Anesthesia Plan Comments:         Anesthesia Quick Evaluation

## 2017-02-17 NOTE — Op Note (Signed)
[facility] Patient Name: [name] Procedure Date : 02/17/2017 MRN: [id number] Attending MD: Jerene Bears , MD Date of Birth: 1941/02/17 CSN: [id number] Age: 76 Admit Type: Inpatient Procedure:                Colonoscopy Indications:              Hematochezia (predominant over the last several                            days), Melena (initial concern), Acute post                            hemorrhagic anemia, recent EGD with mild gastritis                            in gastric cardia but no source of GI bleed, recent                            VCE with gastritis, scattered non-bleeding                            angiodysplastic lesions in the small bowel without                            explanation of hematochezia; reportedly                            unremarkable colonoscopy with Dr. Melina Copa in                            [place], [place] 1 year ago Providers:                Lajuan Lines. Hilarie Fredrickson, MD, Carolynn Comment, RN, Alfonso Patten, Technician, Lavona Mound, CRNA Referring MD:             [facility] (Dr. Wardell Heath) Medicines:                Monitored Anesthesia Care Complications:            No immediate complications. Estimated Blood Loss:     Estimated blood loss: none. Procedure:                Pre-Anesthesia Assessment:                           - Prior to the procedure, a History and Physical                            was performed, and patient medications and                            allergies were reviewed. The patient's tolerance of  previous anesthesia was also reviewed. The risks                            and benefits of the procedure and the sedation                            options and risks were discussed with the patient.                            All questions were answered, and informed consent                            was obtained. Prior Anticoagulants: The patient has               taken Xarelto (rivaroxaban), last dose was 5 days                            prior to procedure. ASA Grade Assessment: III - A                            patient with severe systemic disease. After                            reviewing the risks and benefits, the patient was                            deemed in satisfactory condition to undergo the                            procedure.                           After obtaining informed consent, the colonoscope                            was passed under direct vision. Throughout the                            procedure, the patient's blood pressure, pulse, and                            oxygen saturations were monitored continuously. The                            [id number] scope was introduced through                            the anus and advanced to the the cecum, identified                            by appendiceal orifice and ileocecal valve. The                            colonoscopy was technically difficult  and complex                            due to multiple diverticula in the colon.                            Successful completion of the procedure was aided by                            applying abdominal pressure. The patient tolerated                            the procedure well. The quality of the bowel                            preparation was good. The ileocecal valve,                            appendiceal orifice, and rectum were photographed. Scope In: 11:52:31 AM Scope Out: 12:29:12 PM Scope Withdrawal Time: 0 hours 14 minutes 13 seconds  Total Procedure Duration: 0 hours 36 minutes 41 seconds  Findings:      The digital rectal exam was normal.      Many small and large-mouthed diverticula were found from sigmoid to       ascending colon. This was most severe in the left colon and mild in the       right colon. There was narrowing of the colon in association with the       diverticular  openings in the rectosigmoid and sigmoid colon. There was       no evidence of active diverticular bleeding.      A foreign body (previously ingested pill cam) was found in the ascending       colon.      A 3 to 4 mm polyp was found in the rectum. The polyp was felt to be       hyperplastic based on the typical appearance/pit pattern. Polypectomy       was not attempted due to recent bleeding.      External hemorrhoids were found during retroflexion and during perianal       exam. The hemorrhoids were small. Impression:               - Severe diverticulosis from sigmoid to ascending                            colon. There was narrowing of the colon in                            association with the diverticular opening. There                            was no evidence of active diverticular bleeding.                            With recent information, diverticular bleeding is  felt to most likely explain recent hematochezia.                            Iron deficiency in the setting of anticoagulation                            is likely in part to slow GI blood loss from                            angiodysplastic lesions in the small bowel.                           - Pill cam in the ascending colon.                           - One 3 to 4 mm polyp in the rectum. Resection not                            attempted due to benign nature of this polyp and                            recent bleeding.                           - Small non-bleeding external hemorrhoids.                           - No specimens collected. Moderate Sedation:      N/A Recommendation:           - Patient has a contact number available for                            emergencies. The signs and symptoms of potential                            delayed complications were discussed with the                            patient. Return to normal activities tomorrow.                             Written discharge instructions were provided to the                            patient.                           - Resume previous diet.                           - Continue present medications. Resuming                            anticoagulation deferred to cardiology/primary team.                           -  No recommendation at this time regarding repeat                            colonoscopy due to age.                           - Iron labs and IV iron replacement. Procedure Code(s):        --- Professional ---                           [phone], Colonoscopy, flexible; diagnostic, including                            collection of specimen(s) by brushing or washing,                            when performed (separate procedure) Diagnosis Code(s):        --- Professional ---                           K64.4, Residual hemorrhoidal skin tags                           T18.4XXA, Foreign body in colon, initial encounter                           K62.1, Rectal polyp                           K92.1, Melena (includes Hematochezia)                           D62, Acute posthemorrhagic anemia                           K57.30, Diverticulosis of large intestine without                            perforation or abscess without bleeding CPT copyright 2016 American Medical Association. All rights reserved. The codes documented in this report are preliminary and upon coder review may  be revised to meet current compliance requirements. Jerene Bears, MD 02/17/2017 12:54:39 PM This report has been signed electronically. Number of Addenda: 0

## 2017-02-17 NOTE — Progress Notes (Addendum)
Pharmacy note: IV iron  76 yo female with acute blood loss anemia due to concern of GIB. She is s/p capsule endoscopy with gastritis and no active bleeding.  -Hg was 6.4 on admit and now 9.5 s/p PRBC (5 units total) -iron= 11, % sat= 3, ferritin= 15  Plan -will give a test dose of iron dextran then 1000mg  IV x1 Will sign off. Please contact pharmacy with any other needs.  Thank you   Hildred Laser, Pharm D 02/17/2017 2:17 PM

## 2017-02-17 NOTE — Progress Notes (Signed)
**  Preliminary report by tech**  Bilateral lower extremity venous duplex complete. There is no evidence of deep or superficial vein thrombosis involving the right and left lower extremities. All visualized vessels appear patent and compressible. There is no evidence of Baker's cysts bilaterally.  02/17/17 4:03 PM Carlos Levering RVT

## 2017-02-17 NOTE — Care Management Note (Signed)
Case Management Note Marvetta Gibbons RN, BSN Unit 2W-Case Manager [phone]  Patient Details  Name: [name] MRN: [id number] Date of Birth: 08/30/1941  Subjective/Objective:  Pt admitted with GIB                  Action/Plan: PTA pt lived at home alone, anticipate return home, CM to follow for d/c needs  Expected Discharge Date:                  Expected Discharge Plan:  Home/Self Care  In-House Referral:     Discharge planning Services  CM Consult  Post Acute Care Choice:  NA Choice offered to:  NA  DME Arranged:    DME Agency:     HH Arranged:    [place] Agency:     Status of Service:  In process, will continue to follow  If discussed at Long Length of Stay Meetings, dates discussed:    Additional Comments:  Dawayne Patricia, RN 02/17/2017, 2:41 PM

## 2017-02-17 NOTE — Anesthesia Postprocedure Evaluation (Signed)
Anesthesia Post Note  Patient: [name]  Procedure(s) Performed: Procedure(s) (LRB): COLONOSCOPY WITH PROPOFOL (N/A)  Patient location during evaluation: PACU Anesthesia Type: MAC Level of consciousness: awake and alert Pain management: pain level controlled Vital Signs Assessment: post-procedure vital signs reviewed and stable Respiratory status: spontaneous breathing, nonlabored ventilation and respiratory function stable Cardiovascular status: stable and blood pressure returned to baseline Anesthetic complications: no       Last Vitals:  Vitals:   02/17/17 1245 02/17/17 1250  BP: (!) 118/40 (!) 131/39  Pulse: 84 86  Resp: 19 (!) 25  Temp:      Last Pain:  Vitals:   02/17/17 1235  TempSrc: Oral  PainSc:                  Tiffay Pinette,W. EDMOND

## 2017-02-18 ENCOUNTER — Encounter (HOSPITAL_COMMUNITY): Payer: Self-pay | Admitting: Internal Medicine

## 2017-02-18 DIAGNOSIS — E039 Hypothyroidism, unspecified: Secondary | ICD-10-CM

## 2017-02-18 DIAGNOSIS — I10 Essential (primary) hypertension: Secondary | ICD-10-CM

## 2017-02-18 LAB — BASIC METABOLIC PANEL
Anion gap: 9 (ref 5–15)
BUN: 6 mg/dL (ref 6–20)
CO2: 26 mmol/L (ref 22–32)
Calcium: 8.8 mg/dL — ABNORMAL LOW (ref 8.9–10.3)
Chloride: 109 mmol/L (ref 101–111)
Creatinine, Ser: 0.71 mg/dL (ref 0.44–1.00)
GFR calc Af Amer: >60 mL/min (ref >60)
GFR calc non Af Amer: >60 mL/min (ref >60)
Glucose, Bld: 109 mg/dL — ABNORMAL HIGH (ref 65–99)
Potassium: 3.1 mmol/L — ABNORMAL LOW (ref 3.5–5.1)
Sodium: 144 mmol/L (ref 135–145)

## 2017-02-18 LAB — CBC
HCT: 28.3 % — ABNORMAL LOW (ref 36.0–46.0)
Hemoglobin: 8.9 g/dL — ABNORMAL LOW (ref 12.0–15.0)
MCH: 25.9 pg — ABNORMAL LOW (ref 26.0–34.0)
MCHC: 31.4 g/dL (ref 30.0–36.0)
MCV: 82.5 fL (ref 78.0–100.0)
Platelets: 270 10*3/uL (ref 150–400)
RBC: 3.43 MIL/uL — ABNORMAL LOW (ref 3.87–5.11)
RDW: 17.6 % — ABNORMAL HIGH (ref 11.5–15.5)
WBC: 6.9 10*3/uL (ref 4.0–10.5)

## 2017-02-18 NOTE — Progress Notes (Signed)
Daily Rounding Note  02/18/2017, 8:15 AM  LOS: 4 days   SUBJECTIVE:   Chief complaint: bleeding PR: none since colonoscopy.  Fells well.  Got iron infusion last night.     OBJECTIVE:         Vital signs in last 24 hours:    Temp:  [97.9 F (36.6 C)-98.9 F (37.2 C)] 97.9 F (36.6 C) (02/28 0428) Pulse Rate:  [79-99] 80 (02/28 0428) Resp:  [16-25] 18 (02/28 0428) BP: (113-150)/(39-72) 136/53 (02/28 0428) SpO2:  [94 %-100 %] 94 % (02/28 0428) Last BM Date: 02/17/17 Filed Weights   02/13/17 1637 02/15/17 0252 02/15/17 0837  Weight: 76 kg (167 lb 9.8 oz) 75.4 kg (166 lb 3.2 oz) 75.3 kg (166 lb)   General: looks well   Heart: RRR Chest: clear bil.  No dyspnea or cough Abdomen: soft, NT,ND.  Active BS.    Extremities: no CCE Neuro/Psych:  Pleasant, calm, oriented x 3.  Fully alert and cooperative.   Intake/Output from previous day: 02/27 0701 - 02/28 0700 In: 2178.3 [P.O.:1600; I.V.:528.3; IV Piggyback:50] Out: 100 [Urine:100]  Intake/Output this shift: No intake/output data recorded.  Lab Results:  Recent Labs  02/16/17 0951 02/17/17 0208 02/18/17 0159  WBC 7.9 8.6 6.9  HGB 9.4* 9.5* 8.9*  HCT 30.0* 30.0* 28.3*  PLT 257 273 270   BMET  Recent Labs  02/16/17 0951 02/17/17 0208 02/18/17 0159  NA 140 142 144  K 3.3* 3.2* 3.1*  CL 106 108 109  CO2 29 27 26   GLUCOSE 125* 99 109*  BUN 10 8 6   CREATININE 0.79 0.72 0.71  CALCIUM 8.8* 8.8* 8.8*   LFT No results for input(s): PROT, ALBUMIN, AST, ALT, ALKPHOS, BILITOT, BILIDIR, IBILI in the last 72 hours. PT/INR No results for input(s): LABPROT, INR in the last 72 hours. Hepatitis Panel No results for input(s): HEPBSAG, HCVAB, HEPAIGM, HEPBIGM in the last 72 hours.  Studies/Results: No results found.   Scheduled Meds: . atorvastatin  20 mg Oral Daily  . citalopram  20 mg Oral TID  . feeding supplement (ENSURE ENLIVE)  237 mL Oral BID BM  .  isosorbide mononitrate  60 mg Oral QHS  . levothyroxine  137 mcg Oral QAC breakfast  . lisinopril  20 mg Oral Daily  . pantoprazole  40 mg Oral Q0600  . pilocarpine  1 drop Both Eyes BID  . roflumilast  500 mcg Oral QHS  . traZODone  150 mg Oral QHS  . umeclidinium bromide  1 puff Inhalation Daily   Continuous Infusions: PRN Meds:.acetaminophen **OR** acetaminophen, nitroGLYCERIN, ondansetron **OR** ondansetron (ZOFRAN) IV, oxyCODONE, polyethylene glycol   ASSESMENT:   *  Melenic/bloody stool.  Now presumed diverticular in origin.  2/24 EGD: Schatzki ring.  non-bleeding erosive gastropathy at cardia. Pancreatic rest.   2/25 Capsule endo: gastric erosions, pancreatic rest.  AVMs in SB starting at distal duodenum vs prox jejunum and last seen in ileum.  No active bleeding.  Majority of AVMs are likely not reachable by standard small bowel enteroscopy.  I (Dr Hilarie Fredrickson) recommend consideration of IVC filter rather than ongoing anticoagulation.  02/17/17 Colonoscopy: Left >> right sided, severe, pan diverticulosis.  Diverticular associated luminal narrowing in rectsigmoid/sigmoid.  HP appearing rectal polyp not removed.  Pill Cam present.  External hemorrhoids. No fresh or old blood    *  Acute blood loss anemia on top of chronic anemia.  likely IDA at baseline.  Post  transfusion iron studies show low iron, low iron sat and ferritin of 15.    Good response to PRBC x 5.  Started on po iron week of 2/19. S/p 2/27 Feraheme infusion (per pharmacy calculations).   *  Chronic Xarelto. stoppped week of 02/02/17 Per Dr Cruzita Lederer: " Without lower extremity DVT, and no prior history of PE, she has no clear indication for IVC placement at this point.  I have discussed the case with interventional radiology, who recommended case to be discussed further with hematology.  I have discussed with hematology as well, and this is somewhat of a gray area.  Without clear indication for IVC filter, this cannot really be  placed right now as the procedure itself as well as presence of the filter long-term has its own complications.   -I think the safest way is for patient to come off of anticoagulation currently, and she will need referral to hematology as an outpatient, she can follow-up here in town or in [place] with hematologist of her PCPs choice within 1-2 weeks.  She can either go without any anticoagulation and have regular follow-up to evaluate for presence of thrombi versus low-dose Eliquis for DVT prevention with slow monitoring for further bleeding"   *  Hypokalemia.      PLAN   *  Applaud Dr Arman Filter diligence and agree with his plan for mgt of pt's DVT hx/risk and anticoagulation.   *  Daily PPI for gastritis.  Ok to discharge from GI perspective.  Will sign off.  Call if questions etc.  GI follow up prn in [place] with Dr Melina Copa.      Azucena Freed  02/18/2017, 8:15 AM Pager: [phone]

## 2017-02-18 NOTE — Progress Notes (Signed)
Discharge instructions given. Pt verbalized understanding and all questions were answered.  

## 2017-02-18 NOTE — Care Management Important Message (Signed)
Important Message  Patient Details  Name: [name] MRN: [id number] Date of Birth: 1941/01/13   Medicare Important Message Given:  Yes    Nathen May 02/18/2017, 10:27 AM

## 2017-02-18 NOTE — Progress Notes (Addendum)
Nutrition Follow Up  DOCUMENTATION CODES:   Non-severe (moderate) malnutrition in context of acute illness/injury  INTERVENTION:    Continue Ensure Enlive po BID, each supplement provides 350 kcal and 20 grams of protein  NUTRITION DIAGNOSIS:   Inadequate oral intake related to poor appetite as evidenced by per patient/family report, 2.3 percent weight loss over 1 week, resolved  GOAL:   Patient will meet greater than or equal to 90% of their needs, met  MONITOR:   PO intake, Supplement acceptance, Labs, I & O's, Weight trends  ASSESSMENT:   76 year old female with PMHx of HTN, PVD, COPD, HLD, OA, DVT, Hyperthyroidism, CAD s/p CABG in 9628, Chronic diastolic heart failure, GERD who presented per referral of her PCP for chest pain, weakness, dark stools, epigastric pain.    Pt s/p small bowel capsule endoscopy 2/25. Findings were gastritis, angiodysplastic lesions in small bowel. Also had colonoscopy 2/27 which showed severe diverticulosis.  PO intake improved at 75-100% per flowsheet records. Receiving Ensure Enlive nutrition supplements.  Diet Order:  Diet Heart Room service appropriate? Yes; Fluid consistency: Thin  Skin:  Reviewed, no issues  Last BM:  2/27  Height:   Ht Readings from Last 1 Encounters:  02/15/17 _0  (1.702 m)    Weight:   Wt Readings from Last 1 Encounters:  02/15/17 166 lb (75.3 kg)    Ideal Body Weight:  61.4 kg  BMI:  Body mass index is 26 kg/m.  Estimated Nutritional Needs:   Kcal:  1600-1800  Protein:  75-90 gm  Fluid:  1.6-1.8 L  EDUCATION NEEDS:   No education needs identified at this time  Arthur Holms, RD, LDN Pager #: [phone] After-Hours Pager #: [phone]

## 2017-02-18 NOTE — Care Management Note (Signed)
Case Management Note Marvetta Gibbons RN, BSN Unit 2W-Case Manager [phone]  Patient Details  Name: [name] MRN: [id number] Date of Birth: [birth year]  Subjective/Objective:  Pt admitted with GIB                  Action/Plan: PTA pt lived at home alone, anticipate return home, CM to follow for d/c needs  Expected Discharge Date:  02/18/17               Expected Discharge Plan:  Home/Self Care  In-House Referral:     Discharge planning Services  CM Consult  Post Acute Care Choice:  NA Choice offered to:  NA  DME Arranged:    DME Agency:     HH Arranged:    [place] Agency:     Status of Service:  Completed, signed off  If discussed at [employer] of Stay Meetings, dates discussed:    Additional Comments:  Judy Patricia, RN 02/18/2017, 11:40 AM

## 2017-02-18 NOTE — Discharge Summary (Addendum)
Physician Discharge Summary  [name] [id number] DOB: 08-29-1941 DOA: 02/13/2017  PCP: [name], MD  Admit date: 02/13/2017 Discharge date: 02/18/2017  Admitted From: Home  Disposition:  Home   Recommendations for Outpatient Follow-up:  1. Follow up with PCP in 1- week 2. Anticoagulation has been held, patient will need close follow up as outpatient.   Home Health: No  Equipment/Devices: No   Discharge Condition: Stable CODE STATUS: Full  Diet recommendation: Heart Healthy  Brief/Interim Summary: This is a 76 yo female with history of DVT on 2002 and 2011, on rivaroxarban, who presented with a chief complaint of chest pain. For last 3 days prior to hospitalization patient complained of chest pain associated with weakness, she was seen as an outpatient by her primary care provider, she was noted to have severe anemia and referred to the hospital for further evaluation. On initial physical examination blood pressure was 103/52, heart rate 94, respiratory 16, oxygen saturation 100%, temperature 98.3, heart S1-S2 present and rhythmic, no lower extremity edema, her lungs were clear to auscultation bilaterally, her abdomen was soft and nontender, lower extremity edema. Sodium 141, potassium 2.8, chloride 98, bicarbonate 29, glucose 157, creatinine 0.9, BUN 24, white count 11.6, hemoglobin 6.4, hematocrit 21.4, platelets 288. Her chest film was hypoinflated with no infiltrates. Her EKG was sinus rhythm, left axis deviation, prolonged QRS 126, no specific right or left bundle-branch.   The patient was admitted to the hospital with the working diagnosis of acute blood loss anemia complicated by hypokalemia.   1. Acute blood loss anemia due to upper GI bleeding. Patient was admitted to the medical floor with remote telemetry, she was placed on antiacid therapy. Cell count monitoring. A total of 5 units of packed red blood cells were transfused, with stabilization of hemoglobin and  hematocrit, no further signs of active bleeding. Discharge hemoglobin 8.9, hematocrit 28.3. Further workup including upper endoscopy showed nonbleeding erosive gastropathy, capsule endoscopy showed gastric erosions, AVMs in the small bowel, starting at distal duodenum/proximal jejunum and last seen in the ileum, colonoscopy severe pan diverticulosis. Positive external hemorrhoids. No signs of active bleeding in either upper or lower endoscopy. Anticoagulation was held. Patient will continue proton pump inhibitors.   2. History of lower extremity deep vein thrombosis. Ultrasonography of the lower extremities was negative. Last DVT was in 2011, apparently associated with nonspecific illness with consequent sedentary state, her first DVT was 2002, apparently associated with the postoperative coronary artery bypass grafting. Patient has been active at home. Risk versus benefit at this point will hold on further anticoagulation, patient will need a close follow-up, probably will need DVT prophylaxis on risky situations like hospitalizations, acute illness and long travel.   3. Coronary disease status post bypass grafting. No further chest pain or angina, will recommend keep hemoglobin between 9 and 10. Continue atorvastatin, isosorbide, lisinopril. Continue as needed nitroglycerin.  4. Iron deficiency anemia. Serum iron 11 with total iron binding capacity of 353 and transferring saturation of 3. Patient received IV iron dextran during this hospitalization. She received 5 units of packed red blood cells during this hospitalization, with good toleration.   5. Hypothyroidism. Continue levothyroxine  6. Hypertension. Continue lisinopril  7. Depression. Continue Celexa, trazodone.  8. Hypokalemia. Potassium was corrected. Discharge K at 3,1, will continue kcl at home.      Discharge Diagnoses:  Principal Problem:   GI bleed Active Problems:   Hypothyroidism   Essential hypertension   COPD (chronic  obstructive pulmonary  disease) ([place])   CAD (coronary artery disease)   Dyslipidemia   Chest pain   Blood loss anemia   Malnutrition of moderate degree   Heme positive stool   Angiodysplasia of intestine   Hematochezia    Discharge Instructions   Allergies as of 02/18/2017      Reactions   Zolpidem Tartrate Anxiety   CONFUSION    Penicillins Rash      Medication List    TAKE these medications   allopurinol 100 MG tablet Commonly known as:  ZYLOPRIM Take 100 mg by mouth daily.   atorvastatin 20 MG tablet Commonly known as:  LIPITOR TAKE 1 TABLET ONCE DAILY.   citalopram 20 MG tablet Commonly known as:  CELEXA Take 20 mg by mouth 3 (three) times daily.   cyclobenzaprine 10 MG tablet Commonly known as:  FLEXERIL Take 10 mg by mouth 3 (three) times daily as needed for muscle spasms.   furosemide 80 MG tablet Commonly known as:  LASIX Take 80 mg by mouth 2 (two) times daily.   INCRUSE ELLIPTA 62.5 MCG/INH Aepb Generic drug:  umeclidinium bromide Inhale 1 puff into the lungs daily.   isosorbide mononitrate 60 MG 24 hr tablet Commonly known as:  IMDUR TAKE 1 TABLET AT BEDTIME.   levothyroxine 137 MCG tablet Commonly known as:  SYNTHROID, LEVOTHROID Take 137 mcg by mouth daily before breakfast.   lisinopril 20 MG tablet Commonly known as:  PRINIVIL,ZESTRIL Take 20-40 mg by mouth daily as needed. Pt. Reports that she takes her BP & if its elevated she takes 20-40 mg daily as needed   nitroGLYCERIN 0.4 MG SL tablet Commonly known as:  NITROSTAT Place 1 tablet (0.4 mg total) under the tongue every 5 (five) minutes as needed for chest pain. For chest pain   omeprazole 40 MG capsule Commonly known as:  PRILOSEC Take 40 mg by mouth 2 (two) times daily.   Oxycodone HCl 10 MG Tabs Take 10 mg by mouth every 6 (six) hours as needed for pain.   pilocarpine 1 % ophthalmic solution Commonly known as:  PILOCAR Place 1 drop into both eyes 2 (two) times daily. What  changed:  Another medication with the same name was removed. Continue taking this medication, and follow the directions you see here.   potassium chloride 20 MEQ packet Commonly known as:  KLOR-CON Take 20 mEq by mouth daily.   roflumilast 500 MCG Tabs tablet Commonly known as:  DALIRESP Take 500 mcg by mouth at bedtime.   traZODone 50 MG tablet Commonly known as:  DESYREL Take 150 mg by mouth at bedtime.       Allergies  Allergen Reactions  . Zolpidem Tartrate Anxiety    CONFUSION   . Penicillins Rash    Consultations:  Gastroenterology    Procedures/Studies: Dg Chest 2 View  Result Date: 02/13/2017 CLINICAL DATA:  Chest pain, dizziness for 1 week, COPD EXAM: CHEST  2 VIEW COMPARISON:  05/02/2014 FINDINGS: Cardiomediastinal silhouette is stable. Bilateral calcified breast implants again noted. Status post CABG. No infiltrate or pulmonary edema. Mild hyperinflation. Osteopenia and degenerative changes thoracic spine. Right shoulder prosthesis. Extensive degenerative changes left shoulder. IMPRESSION: No active disease. Bilateral calcified breast implants again noted. Status post CABG. Right shoulder prosthesis. Degenerative changes left shoulder. Electronically Signed   By: Lahoma Crocker M.D.   On: 02/13/2017 11:07    2/24 EGD: Schatzki ring. non-bleeding erosive gastropathyat cardia. Pancreatic rest.  2/25 Capsule endo: gastric erosions, pancreatic rest.  AVMs  in SB starting at distal duodenum vs prox jejunum and last seen in ileum.  No active bleeding. Majority of AVMs are likely not reachable by standard small bowel enteroscopy. I (Dr Hilarie Fredrickson) recommend consideration of IVC filter rather than ongoing anticoagulation.  02/17/17 Colonoscopy: Left >> right sided, severe, pan diverticulosis.  Diverticular associated luminal narrowing in rectsigmoid/sigmoid.  HP appearing rectal polyp not removed.  Pill Cam present.  External hemorrhoids. No fresh or old blood       Subjective: Patient feeling well, no signs of further bleeding, no nausea or vomiting.   Discharge Exam: Vitals:   02/17/17 2137 02/18/17 0428  BP: (!) 113/50 (!) 136/53  Pulse: 79 80  Resp:  18  Temp:  97.9 F (36.6 C)   Vitals:   02/17/17 1312 02/17/17 2040 02/17/17 2137 02/18/17 0428  BP: (!) 147/63 (!) 132/59 (!) 113/50 (!) 136/53  Pulse: 85 88 79 80  Resp: 16 18  18   Temp: 98.6 F (37 C) 98.3 F (36.8 C)  97.9 F (36.6 C)  TempSrc: Oral Oral  Oral  SpO2: 96% 96%  94%  Weight:      Height:        General: Pt is alert, awake, not in acute distress Cardiovascular: RRR, S1/S2 +, no rubs, no gallops Respiratory: CTA bilaterally, no wheezing, no rhonchi Abdominal: Soft, NT, ND, bowel sounds + Extremities: no edema, no cyanosis    The results of significant diagnostics from this hospitalization (including imaging, microbiology, ancillary and laboratory) are listed below for reference.     Microbiology: Recent Results (from the past 240 hour(s))  MRSA PCR Screening     Status: None   Collection Time: 02/13/17  5:02 PM  Result Value Ref Range Status   MRSA by PCR NEGATIVE NEGATIVE Final    Comment:        The GeneXpert MRSA Assay (FDA approved for NASAL specimens only), is one component of a comprehensive MRSA colonization surveillance program. It is not intended to diagnose MRSA infection nor to guide or monitor treatment for MRSA infections.      Labs: BNP (last 3 results)  Recent Labs  02/13/17 1148  BNP 0000000*   Basic Metabolic Panel:  Recent Labs Lab 02/14/17 0232 02/15/17 0210 02/16/17 0951 02/17/17 0208 02/18/17 0159  NA 141 142 140 142 144  K 3.6 3.2* 3.3* 3.2* 3.1*  CL 103 106 106 108 109  CO2 30 30 29 27 26   GLUCOSE 102* 100* 125* 99 109*  BUN 24* 18 10 8 6   CREATININE 0.82 0.73 0.79 0.72 0.71  CALCIUM 8.8* 8.7* 8.8* 8.8* 8.8*   Liver Function Tests:  Recent Labs Lab 02/13/17 1148  AST 18  ALT 9*  ALKPHOS 68   BILITOT 0.7  PROT 6.1*  ALBUMIN 3.0*   No results for input(s): LIPASE, AMYLASE in the last 168 hours. No results for input(s): AMMONIA in the last 168 hours. CBC:  Recent Labs Lab 02/14/17 0232 02/15/17 0210 02/15/17 1936 02/16/17 0951 02/17/17 0208 02/18/17 0159  WBC 10.8* 8.8  --  7.9 8.6 6.9  HGB 7.1* 7.9* 9.1* 9.4* 9.5* 8.9*  HCT 22.8* 24.9* 29.1* 30.0* 30.0* 28.3*  MCV 78.9 80.3  --  81.7 82.0 82.5  PLT 268 245  --  257 273 270   Cardiac Enzymes: No results for input(s): CKTOTAL, CKMB, CKMBINDEX, TROPONINI in the last 168 hours. BNP: Invalid input(s): POCBNP CBG: No results for input(s): GLUCAP in the last 168 hours. D-Dimer No  results for input(s): DDIMER in the last 72 hours. Hgb A1c No results for input(s): HGBA1C in the last 72 hours. Lipid Profile No results for input(s): CHOL, HDL, LDLCALC, TRIG, CHOLHDL, LDLDIRECT in the last 72 hours. Thyroid function studies No results for input(s): TSH, T4TOTAL, T3FREE, THYROIDAB in the last 72 hours.  Invalid input(s): FREET3 Anemia work up  Recent Labs  02/17/17 1312  FERRITIN 15  TIBC 353  IRON 11*   Urinalysis    Component Value Date/Time   COLORURINE YELLOW 03/30/2011 0901   APPEARANCEUR CLEAR 03/30/2011 0901   LABSPEC 1.015 03/30/2011 0901   PHURINE 6.5 03/30/2011 0901   GLUCOSEU NEGATIVE 03/30/2011 0901   HGBUR NEGATIVE 03/30/2011 0901   BILIRUBINUR NEGATIVE 03/30/2011 0901   KETONESUR NEGATIVE 03/30/2011 0901   PROTEINUR NEGATIVE 03/30/2011 0901   UROBILINOGEN 1.0 03/30/2011 0901   NITRITE NEGATIVE 03/30/2011 0901   LEUKOCYTESUR  03/30/2011 0901    NEGATIVE MICROSCOPIC NOT DONE ON URINES WITH NEGATIVE PROTEIN, BLOOD, LEUKOCYTES, NITRITE, OR GLUCOSE <1000 mg/dL.   Sepsis Labs Invalid input(s): PROCALCITONIN,  WBC,  LACTICIDVEN Microbiology Recent Results (from the past 240 hour(s))  MRSA PCR Screening     Status: None   Collection Time: 02/13/17  5:02 PM  Result Value Ref Range Status    MRSA by PCR NEGATIVE NEGATIVE Final    Comment:        The GeneXpert MRSA Assay (FDA approved for NASAL specimens only), is one component of a comprehensive MRSA colonization surveillance program. It is not intended to diagnose MRSA infection nor to guide or monitor treatment for MRSA infections.      Time coordinating discharge: 45 minutes  SIGNED:   Tawni Millers, MD  [facility] 02/18/2017, 9:58 AM Pager   If 7PM-7AM, please contact night-coverage [email or web address] Password [email or web address]

## 2017-02-23 ENCOUNTER — Other Ambulatory Visit: Payer: Self-pay

## 2017-02-23 DIAGNOSIS — Z79899 Other long term (current) drug therapy: Secondary | ICD-10-CM | POA: Diagnosis not present

## 2017-02-23 DIAGNOSIS — G894 Chronic pain syndrome: Secondary | ICD-10-CM | POA: Diagnosis not present

## 2017-02-23 DIAGNOSIS — Z5181 Encounter for therapeutic drug level monitoring: Secondary | ICD-10-CM | POA: Diagnosis not present

## 2017-02-23 DIAGNOSIS — M109 Gout, unspecified: Secondary | ICD-10-CM | POA: Diagnosis not present

## 2017-02-23 DIAGNOSIS — D649 Anemia, unspecified: Secondary | ICD-10-CM | POA: Diagnosis not present

## 2017-02-23 NOTE — Patient Outreach (Signed)
[facility]) Care Management  02/23/2017  [name] Apr 29, 1941 [id number]  REFERRAL DATE: 02/23/17  REFERRAL SOURCE: EMMI COPD program/ hospital admission 02/13/17 - 02/18/17 REFERRAL REASON: EMM COPD red alert for:  Feeling worse: YES Harder to breath: YES Coughing more than yesterday: YES More mucus or thicker mucus: YES Mucus color change: YES # of times rescue inhaler used: 4  Telephone call to patient regarding EMMI COPD red alert referral. Attempted to contact patient at listed home number and mobile number. Unable to reach. HIPAA compliant voice message left at listed home number.  Person answered phone at cell number then hung up. Unable to leave voice message on listed cell number.  PLAN:  RNCM will attempt 2nd telephone outreach to patient within 1 week.   [name] RN,BSN,CCM [facility] Telephonic  [phone]

## 2017-02-25 ENCOUNTER — Ambulatory Visit: Payer: Self-pay

## 2017-02-26 ENCOUNTER — Other Ambulatory Visit: Payer: Self-pay

## 2017-02-26 NOTE — Patient Outreach (Addendum)
[facility]) Care Management  02/26/2017  [name] [birth year] [id number]  REFERRAL DATE: 02/23/17       REFERRAL SOURCE: EMMI COPD program/ hospital admission 02/13/17 - 02/18/17 REFERRAL REASON: EMM COPD red alert for:  Feeling worse: YES Harder to breath: YES Coughing more than yesterday: YES More mucus or thicker mucus: YES Mucus color change: YES # of times rescue inhaler used: 4  CONSENT: RNCM discussed and offered [facility] care management services to patient. Patient verbally consented to  [facility] care management program.  PROVIDER: Dr. Wyatt Haste - primary MD  SOCIAL: Patient reports she lives alone.  Has son that lives close to her and assist as needed. Patient able to do ADL'S and IADL'S.  SUBJECTIVE: Telephone call to patient regarding EMMI COPD red alert. HIPAA verified with patient. Patient states she was seen by her primary MD on 02/24/17 and diagnosed with gout. Patient states she is currently on colchine for gout. Patient reports the medication is making her nauseated.  RNCM advised patient to take medication with food.  RNCM advised patient to call doctor for continued nausea and /or any signs of diarrhea. Patient verbalized understanding.  Patient states she has COPD/ heart failure. Patient states she has shortness of breath with activity. Patient states she is on oxygen at 2L as needed. Patient denies any new onset of symptoms related to her COPD/ heart failure.  Patient states she was recently in the hospital due to anemia. Patient states she is taking iron pills as prescribed by the doctor.  Patient reports she is taking her medications as prescribed. Patient states she is on approximately 20 medication. States she is able to afford them at this time.   ASSESSMENT: Patient will benefit from ongoing disease management/ education for COPD/ heart failure. Patient will benefit from referral to pharmacy due to polypharmacy.. Patients PHQ2 -6, and PHQ9- 18.   Patient will benefit from referral to social worker for anxiety/ depression follow up  PLAN: RNCM will refer patient to disease management case manager, pharmacist and social worker.  RNCM will send patient EMMI education material on COPD, Gout, anemia   Judy Plowman RN,BSN,CCM [facility] Telephonic  [phone]

## 2017-03-02 ENCOUNTER — Other Ambulatory Visit: Payer: Self-pay

## 2017-03-02 NOTE — Patient Outreach (Signed)
[facility]) Care Management  03/02/2017  [name] 1941/09/16 [id number]   Patient triggered Red on EMMI heart failure dashboard, notification sent to [name] RN

## 2017-03-02 NOTE — Patient Outreach (Addendum)
[place] [facility]) Care Management  03/02/2017  [name] [birth year] [id number]  REFERRAL DATE: 03/02/17       REFERRAL SOURCE: EMMI COPD program/ hospital admission 02/13/17 - 02/18/17 REFERRAL REASON: EMM COPD red alert for: Used inhaler: 3 times  SUBJECTIVE; Telephone call to patient regarding EMMI COPD red alert.  HIPAA verified with patient. Patient states she has used her inhaler 3 times overall. Patient states she only uses her inhaler 1 time per day as instructed by her doctor. Patient states she is feeling pretty good and is not having any new symptoms. Patient states she is always short of breath and that is not new for her. Patient denies having any other needs. RNCM reconfirmed with patient that she will be receiving a call from a [facility] care management health coach for COPD disease management and education. Patient verbalized understanding and agreement.  Patient advised by [facility] to continue to take her medications as prescribed.  RNCM advised patient to call her doctor for increase COPD symptoms and call 911 for severe symptoms.   PLAN; Will refer patient to care management assistant to close to [facility].  Patient will be followed up by health coach.  [name] RN,BSN,CCM [facility] Telephonic  [phone]

## 2017-03-03 ENCOUNTER — Other Ambulatory Visit: Payer: Self-pay | Admitting: *Deleted

## 2017-03-03 NOTE — Patient Outreach (Signed)
[place] [facility]) Care Management  03/03/2017  DELICIA BERENS [birth year] [id number]   CSW received referral for assessment/support related to anxiety, depression. CSW attempted to reach patient by phone today- no answer/no voicemail. CSW will try again this week.   Eduard Clos, MSW, [place] Worker  [place] [phone]

## 2017-03-04 ENCOUNTER — Other Ambulatory Visit: Payer: Self-pay | Admitting: Pharmacist

## 2017-03-04 DIAGNOSIS — D649 Anemia, unspecified: Secondary | ICD-10-CM | POA: Diagnosis not present

## 2017-03-04 DIAGNOSIS — M109 Gout, unspecified: Secondary | ICD-10-CM | POA: Diagnosis not present

## 2017-03-04 NOTE — Patient Outreach (Signed)
[facility] [facility]) Care Management  [place]   03/04/2017  [name] 1941/01/22 [id number]  Subjective: [name] is a 76 y.o. female referred to pharmacy for medication review. Reviewed patient's allergy, medication and problem list in order to perform this evaluation. Note that per EPIC chart review, patient was admitted to [facility] from 02/13/17 to 02/18/17. Per discharge summary note from 02/18/17, patient was found to have acute blood loss anemia due to upper GI bleeding and her [name] was held. Per note, patient has a history of DVTs in 2002 and 2011.  Called and spoke with patient. HIPAA identifiers verified and verbal consent received. Reviewed with [name] each of her medications including name, dose, indication and administration.  [name] reports that she has been having trouble with her gout. Reports that she is no longer taking colchicine, but that she is taking allopurinol, which she was started on 2 weeks ago by her PCP. Reports that she has a follow-up visit this afternoon with her PCP, Dr. [name].   Reports that she has significant pain throughout the day and difficulty with sleeping at night. Reports that she has chronic pain in her back, neck, shoulders and hips. Reports that she has metal rods in her back and neck as well as arthritis "all over". Reports that she takes oxycodone four times daily on a scheduled basis to control the pain. Reports that, as we are talking, her pain is at a level of 6 on a scale of 0-10 and that this is with the peak effect of her oxycodone. Counsel patient about risk of dizziness and sedation with oxycodone. Patient denies any dizziness/sedation with this mediation. Patient reports that she does sometimes experience constipation and that she takes docusate daily to help with this side effect. Denies any current constipation. Reports that she does consistently take the oxycodone with food.  Reports that at  bedtime she has difficulty sleeping because of her pain and a feeling of restless legs. Reports that she takes 3 tablets of cyclobenzaprine 10 mg and 3 tablets of pramipexole 0.25 mg each night at bedtime as directed by her PCP. Discuss with patient the risk of sedation and dizziness with these medications, particularly with this dose of cyclobenzaprine and particularly in combination with her scheduled oxycodone. Patient reports that she did have one fall in the past year, about 6-8 months ago. Reports that she was going down steps and missed the last step. Patient admits that she does sometimes have difficulty with dizziness/sedation after she wakes in the morning. Encourage patient to talk to her PCP when she sees him today about this morning dizziness/sedation and her medications. Patient states that she will discuss this with her PCP.  [name] states that today she and Dr. [name] are also to be discussing whether she is going to resume her Xarelto, as this has been on hold since her hospitalization.  Note that [name] reports that she is not currently taking omeprazole. Per her discharge summary in EPIC from 02/18/17, she was to have continued this twice daily. [name] reports that she will discuss this with Dr. [name] as well.  [name] reports that she does have significant dry mouth. Reports that she is able to take her medications, but that she is sure to eat apple sauce after taking her large potassium pill to make sure that the pill goes down all of the way. Discuss with [name] the increased  risk of gastric and intestinal irritation and ulceration with potassium tablets when patients are on anticholinergic medications, like cyclobenzaprine, Incruse and ipratropium. Discuss with patient the option of alternative potassium dosage forms such as sprinkle capsules or oral solutions. [name] understanding and states that she will discuss these options with her PCP  today.  [name] checks her Nitrostat bottle while we are on the phone to confirm that this medication is not expired.  [name] reports that her [place]igg is expensive to her, but states that this is currently affordable. Denies needing assistance with medication cost at this time.   Patient reports that she has no further medication questions/concerns at this time. Provide patient with my phone number.  Let [name] know that I will send her PCP a note about our discussion.   Objective:   Encounter Medications: Outpatient Encounter Prescriptions as of 03/04/2017  Medication Sig Note  . allopurinol (ZYLOPRIM) 100 MG tablet Take 100 mg by mouth daily.   . ARIPiprazole (ABILIFY) 2 MG tablet Take 2 mg by mouth daily.   Marland Kitchen atorvastatin (LIPITOR) 20 MG tablet TAKE 1 TABLET ONCE DAILY.   . citalopram (CELEXA) 20 MG tablet Take 20 mg by mouth 3 (three) times daily.  03/04/2017: Take 1.5 tablets (30 mg) nightly at bedtime  . cyclobenzaprine (FLEXERIL) 10 MG tablet Take 10 mg by mouth 3 (three) times daily as needed for muscle spasms.    Marland Kitchen docusate sodium (COLACE) 100 MG capsule Take 100 mg by mouth daily.   . ferrous sulfate 325 (65 FE) MG tablet Take 325 mg by mouth daily with breakfast.   . furosemide (LASIX) 80 MG tablet Take 80 mg by mouth 2 (two) times daily.    . INCRUSE ELLIPTA 62.5 MCG/INH AEPB Inhale 1 puff into the lungs daily.    . Ipratropium-Albuterol (COMBIVENT) 20-100 MCG/ACT AERS respimat Inhale 1 puff into the lungs every 6 (six) hours as needed for wheezing.   . isosorbide mononitrate (IMDUR) 60 MG 24 hr tablet TAKE 1 TABLET AT BEDTIME. 03/04/2017: Takes 1 tablet each morning  . levothyroxine (SYNTHROID, LEVOTHROID) 137 MCG tablet Take 137 mcg by mouth daily before breakfast.    . lisinopril (PRINIVIL,ZESTRIL) 20 MG tablet Take 20-40 mg by mouth daily as needed. Pt. Reports that she takes her BP & if its elevated she takes 20-40 mg daily as needed   . nitroGLYCERIN  (NITROSTAT) 0.4 MG SL tablet Place 1 tablet (0.4 mg total) under the tongue every 5 (five) minutes as needed for chest pain. For chest pain   . Oxycodone HCl 10 MG TABS Take 10 mg by mouth every 6 (six) hours as needed for pain.   . pilocarpine (PILOCAR) 1 % ophthalmic solution Place 1 drop into both eyes 2 (two) times daily.   . potassium chloride (KLOR-CON) 20 MEQ packet Take 20 mEq by mouth daily.   . pramipexole (MIRAPEX) 0.25 MG tablet Take 0.75 mg by mouth at bedtime.   . roflumilast (DALIRESP) 500 MCG TABS tablet Take 500 mcg by mouth at bedtime.    . traZODone (DESYREL) 50 MG tablet Take 150 mg by mouth at bedtime.    Marland Kitchen omeprazole (PRILOSEC) 40 MG capsule Take 40 mg by mouth 2 (two) times daily.    . [DISCONTINUED] colchicine 0.6 MG tablet Take 0.6 mg by mouth 2 (two) times daily.    No facility-administered encounter medications on file as of 03/04/2017.    Assessment:  Patient was recently discharged from  hospital and all medications have been reviewed.  Drugs sorted by system:  Neurologic/Psychologic: aripiprazole, citalopram, trazodone  Cardiovascular: atorvastatin, furosemide, isosorbide, lisinopril, Nitrostat  Pulmonary/Allergy: Incruse, Combivent, Daliresp  Gastrointestinal: docusate  Endocrine: levothyroxine  Pain: cyclobenzaprine, oxycodone, pramipexole  Vitamins/Minerals: ferrous sulfate, Klor-con  Miscellaneous: allopurinol, Pilocarpine eye drops   Duplications in therapy: Incruse and ipratropium in Combivent Gaps in therapy:  . anticoagulation therapy currently on hold due to GI bleeding discovered at 02/13/17 admission. Patient has appointment to follow up about plan for anticoagulation therapy with PCP today. . Patient not currently taking prescribed PPI, omeprazole, which per 02/18/17 discharge summary was to be continued post hospital admission for GI bleeding. Patient to follow up with PCP today and I will send PCP a note. Medications to avoid in the  elderly: oxycodone and cyclobenzaprine; patient counseled & will contact PCP  Drug interactions:   . Combivent + Incruse + cyclobenzaprine: Anticholinergic agents may enhance the anticholinergic effect of other anticholinergic agents. If such combinations cannot be avoided, monitor patients closely for evidence of anticholinergic-related toxicities (e.g., urinary retention, constipation, tachycardia, dry mouth, etc.). Will contact PCP regarding increased risk with multiple anticholinergic agents.  Rebeca Allegra + Combivent/Incruse/cyclobenzaprine: Patients on drugs with substantial anticholinergic effects should avoid using any solid oral dosage form of potassium chloride, as these agents can lead to impaired gastric emptying and increase risk of gastric and intestinal irritation and ulceration. Liquid or effervescent potassium preparations are possible alternatives. Patient to follow up with PCP today and I will send PCP a note. . Citalopram + Abilify + Combivent + trazodone:  QTc-Prolonging Agents may enhance the QTc-prolonging effect of QTc-Prolonging Agents. Note that patient is followed by a cardiologist, Dr. Harrington Challenger, and last had an EKG performed on 02/14/17. . Levothyroxine + ferrous sulfate: Iron Salts may decrease the serum concentration of Levothyroxine. Patient already reports appropriate separation of administration of these medications . Oxycodone + cyclobenzaprine + Abilify: CNS Depressants may enhance the CNS depressant effect of other CNS depressants. Patient counseled and will send note to patient's PCP.   Plan:  1) Patient to follow up with her PCP today, at scheduled appointment, regarding:  a) Whether she is going to resume her Xarelto, as this has been on hold since her hospitalization. b) Her reported difficulty with dizziness/sedation after she wakes in the morning c) Whether she should be taking omeprazole twice daily as directed per her 02/18/17 discharge summary  d) Alternative  potassium dosage forms, such as a sprinkle capsule, to ease administration and decrease risk of gastric irritation.  2) Will send a letter within this encounter, along with my note, to patient's PCP regarding:  a) Patient's reported morning difficulty with dizziness/sedation and concerns about the use of cyclobenzaprine, as well as other CNS depressants in this patient, particularly given her age. Will request that provider re-evaluate need for these medications. b) Recommendation that the provider consider alternative potassium dosage forms, such as a sprinkle capsule, to ease administration and decrease risk of gastric/intestinal irritation for this patient c) Let the provider know that the patient reports that she is not currently taking omeprazole, which per 02/18/17 discharge summary was to be continued post hospital admission for GI bleeding.  d) Will request that he consider switching patient's rescue inhaler from albuterol + ipratropium (Combivent) to albuterol as the additional ipratropium in the Combivent is a therapeutic duplication with the patient's Incruse inhaler and this duplication increases the risk of the patient experiencing anticholinergic side effects.  3) Will call  to follow up with Ms. Schaaf next week.  [name], PharmD, [place] Management [phone]

## 2017-03-05 ENCOUNTER — Other Ambulatory Visit: Payer: Self-pay

## 2017-03-05 NOTE — Patient Outreach (Signed)
[place] [facility]) Care Management  03/05/2017  FONNIE CROOKSHANKS [birth year] [id number]   Unsuccessful attempt to reach patient for initial assessment.  HIPAA appropriate message left with contact information.  If no response RN will make another attempt within 10 days.  Candie Mile, RN, MSN [place] [phone] Fax [phone]

## 2017-03-09 ENCOUNTER — Other Ambulatory Visit: Payer: Self-pay | Admitting: *Deleted

## 2017-03-09 DIAGNOSIS — Z1212 Encounter for screening for malignant neoplasm of rectum: Secondary | ICD-10-CM | POA: Diagnosis not present

## 2017-03-09 NOTE — Patient Outreach (Signed)
[place] [facility]) Care Management  03/09/2017  [name] [birth year] [id number]   CSW received referral for CSW f/u [id number].  CSW made initial contact with patient today by phone. CSW introduced self/role and reason for outreach.  Patient states, "I am doing fairly well". She admits to having depression but denies SI, HI- at this time she is not interested in counseling or follow up. CSW offered to check in again in 1-2 weeks to see if she would like CSW assessment and support. She agrees and states; "so much going on right now". CSW will plan f/u call in 1-2 weeks for further outreach, assessment and to offer CSW support.    Eduard Clos, MSW, [place] Worker  [place] [phone]

## 2017-03-13 ENCOUNTER — Other Ambulatory Visit: Payer: Self-pay | Admitting: Pharmacist

## 2017-03-13 ENCOUNTER — Other Ambulatory Visit: Payer: Self-pay

## 2017-03-13 ENCOUNTER — Ambulatory Visit: Payer: Self-pay | Admitting: Pharmacist

## 2017-03-13 VITALS — Ht 68.0 in | Wt 158.0 lb

## 2017-03-13 DIAGNOSIS — J441 Chronic obstructive pulmonary disease with (acute) exacerbation: Secondary | ICD-10-CM

## 2017-03-13 NOTE — Patient Outreach (Signed)
Call to follow up with [name]. Spoke with [name] last week to perform a medication review. Patient to follow up with PCP the same day, 03/04/17. Also sent provider a note regarding relevant portions of my assessment. Today leave a HIPAA compliant message on the patient's voicemail. If have not heard from patient by 03/16/17, will give her another call at that time.  [name], PharmD, [place] Management [phone]

## 2017-03-13 NOTE — Patient Outreach (Signed)
[place] [facility]) Care Management  03/13/2017  [name] 09-Sep-1941 [id number]   Contact for initial assessment for Disease Management Level II for COPD.  Patient reports she has had COPD since 2002, and that she quit smoking when she was diagnosed.  She reports prn O2 use, but states she is not currently using any nebulizer treatments or inhalers.  She states she has to sit down, put on her O2, and rest frequently throughout the day.  Admission assessment data was not completed today due to "company" arriving at patient's house.    RN will mail welcome packet and COPD packet. RN will follow up within 10 days to complete assessment data.  Candie Mile, RN, MSN [place] [phone] Fax [phone]

## 2017-03-16 ENCOUNTER — Other Ambulatory Visit: Payer: Self-pay | Admitting: Pharmacist

## 2017-03-16 NOTE — Patient Outreach (Signed)
Call to follow up with [name]. Spoke with [name] on 03/04/17 to perform a medication review. Patient to have a follow up appointment with PCP the same day. Also sent provider a note regarding relevant portions of my assessment.   Today, called and spoke with patient. HIPAA identifiers verified and verbal consent received. [name] reports that when she met with her PCP on 03/04/17, he instructed her to take asprin 81 mg daily for now, rather than restart Xarelto at this time, due to her previous bleed. Reports that he referred her to a rheumatologist for further evaluation of the pain in her feet. Reports that she discussed with Dr. Nona [name] her morning dizziness/sedation, but that he did not instruct her to make any adjustments to her medications at that time. Reports that she forgot to ask about the omeprazole prescription or a different dosage form of her potassium.  Let. [name] know that I also sent Dr. Nona [name] a note following our medication review and that I will call his office today to follow up about the note.  [name] reports that she has no further medication questions/concerns at this time.   [name], PharmD, [place] Management [phone]

## 2017-03-16 NOTE — Patient Outreach (Signed)
Call to Ms. Arvella Nigh' PCP, Dr. [name], regarding the letter that I sent him on 03/06/17, along with my note, regarding:  a) Patient's reported morning difficulty with dizziness/sedation and concerns about the use of cyclobenzaprine, as well as other CNS depressants in this patient, particularly given her age. Requested that provider re-evaluate need for these medications. b) Recommendation that the provider consider alternative potassium dosage forms, such as a sprinkle capsule, to ease administration and decrease risk of gastric/intestinal irritation for this patient c) Let the provider know that the patient reports that she is not currently taking omeprazole, which per 02/18/17 discharge summary was to be continued post hospital admission for GI bleeding.  d) Requested that he consider switching patient's rescue inhaler from albuterol + ipratropium (Combivent) to albuterol as the additional ipratropium in the Combivent is a therapeutic duplication with the patient's Incruse inhaler and this duplication increases the risk of the patient experiencing anticholinergic side effects.  Speak with Ardine Eng, nurse with Dr. [name], today. Ardine Eng reports that she does not see the letter. Review with Ardine Eng each of the above concerns from my medication review with the patient. Ardine Eng reports that the omeprazole twice daily is currently active on the patient's medication list and that she will go ahead and send this prescription into the patient's pharmacy. Reports that she will follow up with Dr. [name] with my recommendations about changes to the patient's potassium and her inhaler. Reports that Dr. [name] last prescribed cyclobenzaprine in September 2017 and that it is not currently listed as active on her medication list. Reports that she will follow up with Dr. [name] about this issue as well. Reports that once she has spoken with Dr. [name], she will also follow up with the patient and then give me a call back as  well.  If have not heard back from Dr. Doran Durand office by 03/18/17, will follow up again at that time.  [name], PharmD, [place] Management [phone]

## 2017-03-17 ENCOUNTER — Ambulatory Visit: Payer: Medicare Other | Admitting: *Deleted

## 2017-03-18 ENCOUNTER — Other Ambulatory Visit: Payer: Self-pay | Admitting: Pharmacist

## 2017-03-18 ENCOUNTER — Other Ambulatory Visit: Payer: Self-pay

## 2017-03-18 NOTE — Patient Outreach (Signed)
Receive a voicemail from [place] at Dr. Doran Durand office letting me know that based on the recommendations that I made, Dr. [name] made the following changes for the patient:  1) Called in a new prescription for the patient's omeprazole 2) Changed the patient's potassium to a dissolvable dosage form 3) Switched the patient's Combivent to an albuterol rescue inhaler.  Ardine Eng reports that she called and spoke with Ms. Looney regarding these changes. Reports that she also spoke with Ms. Toda about her cyclobenzaprine and let her know that she was to have discontinued this medication last December. Reports that she instructed the patient to discontinue taking the cyclobenzaprine at this time.  PLAN:  Will close pharmacy episode at this time.  [name], PharmD, [place] Management [phone]

## 2017-03-18 NOTE — Patient Outreach (Signed)
[facility]) Care Management  03/18/2017  JAYLIANNA TATLOCK 29-Jun-1941 [id number]  Telephone call to patient to complete gathering of admission data.  HIPAA identifiers confirmed.  Patient stated she was just leaving to go out.  She requested I call back next week.  RN will reschedule for next week.  Candie Mile, RN, MSN [place] [phone] Fax [phone]

## 2017-03-23 ENCOUNTER — Other Ambulatory Visit: Payer: Self-pay | Admitting: *Deleted

## 2017-03-23 NOTE — Patient Outreach (Signed)
[place] [facility]) Care Management  03/23/2017  [name] May 19, 1941 [id number]   CSW spoke with patient by phone today for follow up from previous call when she declined assessment/visit.  CSW reminded patient of reason for call and she reports that she has been feeling better. "I think I am having less bad days". She has been going to church, visiting with family and finding ways to distract her from feeling depressed and worried.   CSW discussed possible medication and outpatient therapy sessions with her but she feels she is on too many medications already and does not want to do therapy now.  CSW offered support and validated her feelings. CSW offered to folllow up with a call and/or visit to open the referral rec'd- she declines and again reflects on how she feels she is managing better.  CSW discussed ways she can work to reduce her worry and sadness and she was receptive.  Patient declines follow up so will close CSW referral at this time.  CSW encouraged patient to call or have PCP contact me if needs arise.  CSW will advise PCP and [facility] team of CSW referral closure.     Eduard Clos, MSW, [place] Worker  [place] [phone]

## 2017-03-24 ENCOUNTER — Other Ambulatory Visit: Payer: Self-pay

## 2017-03-24 DIAGNOSIS — J441 Chronic obstructive pulmonary disease with (acute) exacerbation: Secondary | ICD-10-CM

## 2017-03-24 NOTE — Patient Outreach (Signed)
[place] [facility]) Care Management  03/24/2017  LUNDYNN COHOON 1941-05-12 [id number]  Telephone contact with patient.  She reports SOB with exertion, requiring O2 at 2L/min prn and usually at night.  Patient reports feeling 'stuffy' the last few days, which she feels may be due to allergies/pollen.  Patient reports she can only eat small amounts at a time.  Encouraged to eat small, frequent meals and to continue supplementation with Boost.  She reports she does not drink as many fluids as she should, and that she likes [facility].  Encouraged patient to decrease sodas and increase other fluids and water as tolerated.  Plan:  Patient will contact PCP re management of allergy symptoms.           Patient will manage constipation to achieve a BM q 1-3 days.           Patient will use COPD Action Plan to manage respiratory disease.           Patient will return signed consent form.           RN will follow up within one month.

## 2017-04-01 DIAGNOSIS — M79672 Pain in left foot: Secondary | ICD-10-CM | POA: Diagnosis not present

## 2017-04-01 DIAGNOSIS — M1A09X0 Idiopathic chronic gout, multiple sites, without tophus (tophi): Secondary | ICD-10-CM | POA: Diagnosis not present

## 2017-04-01 DIAGNOSIS — E669 Obesity, unspecified: Secondary | ICD-10-CM | POA: Diagnosis not present

## 2017-04-01 DIAGNOSIS — Z6834 Body mass index (BMI) 34.0-34.9, adult: Secondary | ICD-10-CM | POA: Diagnosis not present

## 2017-04-01 DIAGNOSIS — M2550 Pain in unspecified joint: Secondary | ICD-10-CM | POA: Diagnosis not present

## 2017-04-01 DIAGNOSIS — M79671 Pain in right foot: Secondary | ICD-10-CM | POA: Diagnosis not present

## 2017-04-06 DIAGNOSIS — Z5181 Encounter for therapeutic drug level monitoring: Secondary | ICD-10-CM | POA: Diagnosis not present

## 2017-04-06 DIAGNOSIS — Z79899 Other long term (current) drug therapy: Secondary | ICD-10-CM | POA: Diagnosis not present

## 2017-04-06 DIAGNOSIS — G894 Chronic pain syndrome: Secondary | ICD-10-CM | POA: Diagnosis not present

## 2017-04-06 DIAGNOSIS — N390 Urinary tract infection, site not specified: Secondary | ICD-10-CM | POA: Diagnosis not present

## 2017-04-06 DIAGNOSIS — D649 Anemia, unspecified: Secondary | ICD-10-CM | POA: Diagnosis not present

## 2017-04-14 ENCOUNTER — Other Ambulatory Visit: Payer: Self-pay

## 2017-04-14 DIAGNOSIS — J449 Chronic obstructive pulmonary disease, unspecified: Secondary | ICD-10-CM | POA: Diagnosis not present

## 2017-04-14 DIAGNOSIS — K219 Gastro-esophageal reflux disease without esophagitis: Secondary | ICD-10-CM | POA: Diagnosis not present

## 2017-04-14 DIAGNOSIS — I5032 Chronic diastolic (congestive) heart failure: Secondary | ICD-10-CM | POA: Diagnosis not present

## 2017-04-14 DIAGNOSIS — D649 Anemia, unspecified: Secondary | ICD-10-CM | POA: Diagnosis not present

## 2017-04-14 DIAGNOSIS — G894 Chronic pain syndrome: Secondary | ICD-10-CM | POA: Diagnosis not present

## 2017-04-14 DIAGNOSIS — E039 Hypothyroidism, unspecified: Secondary | ICD-10-CM | POA: Diagnosis not present

## 2017-04-14 DIAGNOSIS — Z5181 Encounter for therapeutic drug level monitoring: Secondary | ICD-10-CM | POA: Diagnosis not present

## 2017-04-14 DIAGNOSIS — Z79899 Other long term (current) drug therapy: Secondary | ICD-10-CM | POA: Diagnosis not present

## 2017-04-14 DIAGNOSIS — I10 Essential (primary) hypertension: Secondary | ICD-10-CM | POA: Diagnosis not present

## 2017-04-14 DIAGNOSIS — E7800 Pure hypercholesterolemia, unspecified: Secondary | ICD-10-CM | POA: Diagnosis not present

## 2017-04-14 DIAGNOSIS — Z1389 Encounter for screening for other disorder: Secondary | ICD-10-CM | POA: Diagnosis not present

## 2017-04-14 DIAGNOSIS — I2510 Atherosclerotic heart disease of native coronary artery without angina pectoris: Secondary | ICD-10-CM | POA: Diagnosis not present

## 2017-04-14 NOTE — Patient Outreach (Signed)
[facility]) Care Management  04/14/2017  [name] [birth year] [id number]   Unsuccessful attempt to reach patient at both home and cell numbers.  Unable to leave a message. RN will make another attempt within 10 days.  Candie Mile, RN, MSN [place] [phone] Fax [phone]

## 2017-04-21 ENCOUNTER — Other Ambulatory Visit: Payer: Self-pay

## 2017-04-21 DIAGNOSIS — J441 Chronic obstructive pulmonary disease with (acute) exacerbation: Secondary | ICD-10-CM

## 2017-04-21 NOTE — Patient Outreach (Signed)
[place] [facility]) Care Management  04/21/2017  CREASIE LACOSSE 1941/01/04 [id number]  Telephone assessment with patient.  She reports she is still recovering from recent hospitalization in February.  Her main concern today was back pain.  She takes Oxycodone twice a day, uses heat or ice, and rest to manage her pain. No medication changes reported from her recent (04-06-17) PCP visit.  Patient reports no acute respiratory symptoms.  COPD Action Plan reviewed.  Plan: Patient will continue use of COPD Action Plan to monitor and manage her respiratory disease.          RN will follow up in June.   Candie Mile, RN, MSN [place] [phone] Fax [phone]

## 2017-04-22 DIAGNOSIS — E669 Obesity, unspecified: Secondary | ICD-10-CM | POA: Diagnosis not present

## 2017-04-22 DIAGNOSIS — M79671 Pain in right foot: Secondary | ICD-10-CM | POA: Diagnosis not present

## 2017-04-22 DIAGNOSIS — Z6835 Body mass index (BMI) 35.0-35.9, adult: Secondary | ICD-10-CM | POA: Diagnosis not present

## 2017-04-22 DIAGNOSIS — M2550 Pain in unspecified joint: Secondary | ICD-10-CM | POA: Diagnosis not present

## 2017-04-22 DIAGNOSIS — M79672 Pain in left foot: Secondary | ICD-10-CM | POA: Diagnosis not present

## 2017-04-22 DIAGNOSIS — M1A09X0 Idiopathic chronic gout, multiple sites, without tophus (tophi): Secondary | ICD-10-CM | POA: Diagnosis not present

## 2017-04-27 DIAGNOSIS — M810 Age-related osteoporosis without current pathological fracture: Secondary | ICD-10-CM | POA: Diagnosis not present

## 2017-05-07 DIAGNOSIS — M6701 Short Achilles tendon (acquired), right ankle: Secondary | ICD-10-CM | POA: Diagnosis not present

## 2017-05-07 DIAGNOSIS — M722 Plantar fascial fibromatosis: Secondary | ICD-10-CM | POA: Diagnosis not present

## 2017-05-07 DIAGNOSIS — M6702 Short Achilles tendon (acquired), left ankle: Secondary | ICD-10-CM | POA: Diagnosis not present

## 2017-05-26 ENCOUNTER — Other Ambulatory Visit: Payer: Self-pay

## 2017-05-26 VITALS — Ht 68.0 in | Wt 160.0 lb

## 2017-05-26 DIAGNOSIS — J441 Chronic obstructive pulmonary disease with (acute) exacerbation: Secondary | ICD-10-CM

## 2017-05-26 NOTE — Patient Outreach (Signed)
[facility]) Care Management  05/26/2017  KASANDRA FEHR 1941-05-19 [id number]   Telephone assessment with patient.  She reports COPD is well controlled at present.  She has decreased her dependence on Oxygen.  She reports cough is worse in the am, but sputum is not discolored.  Patient reports ongoing chronic back pain.  She takes analgesic twice a day.  She states that getting off her feet and resting relieves her pain.  Patient discussed ongoing grief issues over the loss of her husband.  They were married for around 52 years.  She states her family is supportive, and she declines offers of counseling or support groups.  Plan:  Patient will keep PCP appointment on 06-09-17.           Patient will continue to use COPD Action Plan to manage respiratory conditions.           RN will follow up in July.  Candie Mile, RN, MSN [place] [phone] Fax [phone]

## 2017-06-25 ENCOUNTER — Ambulatory Visit: Payer: Self-pay

## 2017-06-25 DIAGNOSIS — M6702 Short Achilles tendon (acquired), left ankle: Secondary | ICD-10-CM | POA: Diagnosis not present

## 2017-06-25 DIAGNOSIS — M6701 Short Achilles tendon (acquired), right ankle: Secondary | ICD-10-CM | POA: Diagnosis not present

## 2017-06-25 DIAGNOSIS — M722 Plantar fascial fibromatosis: Secondary | ICD-10-CM | POA: Diagnosis not present

## 2017-06-26 ENCOUNTER — Other Ambulatory Visit: Payer: Self-pay

## 2017-06-26 VITALS — Ht 67.0 in | Wt 170.0 lb

## 2017-06-26 DIAGNOSIS — J441 Chronic obstructive pulmonary disease with (acute) exacerbation: Secondary | ICD-10-CM

## 2017-06-26 NOTE — Patient Outreach (Signed)
[facility]) Care Management  06/26/2017  [name] 12/03/1941 [id number]   Telephonic assessment with patient.  She reports she has been having pain in her heels (Plantar fasciitis), and that she saw a doctor yesterday who put injections into each heel.  She also reports ongoing chronic back pain. She states that pain and shortness of breath with exertion keeps her from being as active as she would like.  Patient reports COPD is stable at present.  She has avoided being outside in the heat.  Patient states she would like to lose 10 pounds.  She feels this would decrease her back pain. She does report drinking about 4 sodas per day, and that she has never liked diet sodas.  Discussed sugar/calorie content of sodas, and making healthy choices to help her reach her goal.  Plan:  Patient will try to decrease number of sodas she drinks per day, and/or looks for a diet soda that she likes.           RN will mail EMMI on healthy diet choices.           RN will follow up in August.  Candie Mile, RN, MSN [place] [phone] Fax [phone]

## 2017-07-08 DIAGNOSIS — G894 Chronic pain syndrome: Secondary | ICD-10-CM | POA: Diagnosis not present

## 2017-07-08 DIAGNOSIS — Z79899 Other long term (current) drug therapy: Secondary | ICD-10-CM | POA: Diagnosis not present

## 2017-07-08 DIAGNOSIS — Z5181 Encounter for therapeutic drug level monitoring: Secondary | ICD-10-CM | POA: Diagnosis not present

## 2017-07-28 DIAGNOSIS — M722 Plantar fascial fibromatosis: Secondary | ICD-10-CM | POA: Diagnosis not present

## 2017-07-28 DIAGNOSIS — M6701 Short Achilles tendon (acquired), right ankle: Secondary | ICD-10-CM | POA: Diagnosis not present

## 2017-07-28 DIAGNOSIS — M6702 Short Achilles tendon (acquired), left ankle: Secondary | ICD-10-CM | POA: Diagnosis not present

## 2017-07-29 ENCOUNTER — Other Ambulatory Visit: Payer: Self-pay

## 2017-07-29 NOTE — Patient Outreach (Signed)
[facility]) Care Management  07/29/2017  TOULA MIYASAKI 1941/07/26 [id number]  Unsuccessful attempt to reach patient.  Unable to leave a message.  RN will make another attempt within 10 days.  Candie Mile, RN, MSN [place] [phone] Fax [phone]

## 2017-08-16 NOTE — Progress Notes (Signed)
Cardiology Office Note   Date:  08/17/2017   ID:  [name], [name] 12-11-1941, MRN [id number]  PCP:  [name], MD  Cardiologist:   Dorris Carnes, MD   F/U of CAD     History of Present Illness: [name] is a 76 y.o. female with a history of CAD (s/p CABG 2004 and DES to [place]. Last LHT 11/12 showed instent restenosis to the LM. LCX was occluded in prox segment. SVG to OM was patient as well as LAD  RCA had signif disease but appeared nondominant  The patient was admitted to [facility] in July 2013 with CP [facility] showed localized ischemia in the inferior wall (base, mid) Ranexa was added. Did not tolerate metoprolol Norvasc added.  She also has a hsitory of recurrent DVT (on chronic coumadin), HTN, COPD, HL and carotid stenosis.   I saw the pt in Feb 2018 She was admitted to [facility] on late Feb with UGI bleed  EGD showed gastric errsions, AVMs In small bowel  Colon showed severe pan diverticuosis, hemorrhoids but no active bleeding  Anticoag stopped    Breathing a little  Slower  Gets chest pain with walking  New   Wt is up some  Feet swollen  2 wks   Current Meds  Medication Sig  . allopurinol (ZYLOPRIM) 100 MG tablet Take 100 mg by mouth daily.  Marland Kitchen aspirin EC 81 MG tablet Take 81 mg by mouth daily.  . citalopram (CELEXA) 20 MG tablet Take 20 mg by mouth 3 (three) times daily.   . cyclobenzaprine (FLEXERIL) 10 MG tablet Take 10 mg by mouth 3 (three) times daily as needed for muscle spasms.   Marland Kitchen docusate sodium (COLACE) 100 MG capsule Take 100 mg by mouth daily.  . ferrous sulfate 325 (65 FE) MG tablet Take 325 mg by mouth daily with breakfast.  . furosemide (LASIX) 80 MG tablet Take 80 mg by mouth 2 (two) times daily.   . INCRUSE ELLIPTA 62.5 MCG/INH AEPB Inhale 1 puff into the lungs daily.   . Ipratropium-Albuterol (COMBIVENT) 20-100 MCG/ACT AERS respimat Inhale 1 puff into the lungs every 6 (six) hours as needed for wheezing.  . isosorbide  mononitrate (IMDUR) 60 MG 24 hr tablet TAKE 1 TABLET AT BEDTIME.  . nitroGLYCERIN (NITROSTAT) 0.4 MG SL tablet Place 1 tablet (0.4 mg total) under the tongue every 5 (five) minutes as needed for chest pain. For chest pain  . omeprazole (PRILOSEC) 40 MG capsule Take 40 mg by mouth 2 (two) times daily.   . Oxycodone HCl 10 MG TABS Take 10 mg by mouth every 6 (six) hours as needed for pain.  . pilocarpine (PILOCAR) 1 % ophthalmic solution Place 1 drop into both eyes 2 (two) times daily.  . potassium chloride SA (K-DUR,KLOR-CON) 20 MEQ tablet Take 20 mEq by mouth daily.  . roflumilast (DALIRESP) 500 MCG TABS tablet Take 500 mcg by mouth at bedtime.   . traZODone (DESYREL) 50 MG tablet Take 150 mg by mouth at bedtime.   . [DISCONTINUED] atorvastatin (LIPITOR) 20 MG tablet TAKE 1 TABLET ONCE DAILY.  . [DISCONTINUED] levothyroxine (SYNTHROID, LEVOTHROID) 137 MCG tablet Take 137 mcg by mouth daily before breakfast.   . [DISCONTINUED] pramipexole (MIRAPEX) 0.25 MG tablet Take 0.75 mg by mouth at bedtime.     Allergies:   Zolpidem tartrate and Penicillins   Past Medical History:  Diagnosis Date  . Anemia    bld. transfusion post  lumbar surgery- 2012  . Arthralgia    NOS  . Blood transfusion 2011   WITH BACK SURGERY  . CAD (coronary artery disease)    s/p CABG 2004; s/p DES to LM in 2010;  [place] 10/29/11: EF 50-55%, mild elevated filling pressures, no pulmonary HTN, LM 90% ISR, LAD and CFX occluded, S-RI occluded (old), S-OM3 ok and L-LAD ok, native nondominant RCA 95% -  med rx recommended ; Lexiscan Myoview 7/13 at [facility]: demonstrated "normal LV function, anterior attenuation and localized ischemia, inferior, basilar, mid section"  . Carotid artery occlusion    moderate  . Chronic diastolic heart failure (HCC)    Echo 9/10: EF 78-46%, grade 1 diastolic dysfunction  . COPD (chronic obstructive pulmonary disease) ([place])    Emphysema dxed by Dr. Woody Seller in [place] based on PFTs per pt in 2006;  placed on albuterol  . Depression    TAKES CELEXA  AND  (OFF- WELBUTRIN)  . DVT of lower extremity (deep venous thrombosis) (HCC)    recurrent. bilateral (2 episodes)  . Exertional angina (HCC)    Treated with Isosorbide, Ranexa, amlodipine; intolerant to metoprolol  . GERD (gastroesophageal reflux disease)   . HLD (hyperlipidemia)   . Hypertension   . Hyperthyroidism   . Obesity (BMI 30-39.9) 2009   BMI 33  . Osteoarthritis   . PVD (peripheral vascular disease) ([place])   . Sleep apnea 2012   USED CPAP THEN  STARTED USING SPIRIVA , Nov. 2013- last evaluation , changed from mask to aparatus that is just for her nose.Marland Kitchen     Past Surgical History:  Procedure Laterality Date  . ANKLE SURGERY  2004  . [place]   lower; another scheduled, opt. reports 4 back- lumbar, 3 cerv. fusions  for later 2009  . BREAST ENHANCEMENT SURGERY    . CARDIAC CATHETERIZATION  11/2009   Patent LIMA to LAD and patent SVG to OM1. Occluded SVG to ramus and diagonal. Left main: 90% ostial stenosis, LCX 60-70% proximal stenosis  . COLONOSCOPY WITH PROPOFOL N/A 02/17/2017   Procedure: COLONOSCOPY WITH PROPOFOL;  Surgeon: Jerene Bears, MD;  Location: [facility] ENDOSCOPY;  Service: Endoscopy;  Laterality: N/A;  . CORONARY ANGIOPLASTY WITH STENT PLACEMENT  11/2009   Drug eluting stent to left main artery: 4.0 X 12 mm Ion   . CORONARY ARTERY BYPASS GRAFT  2004  . ESOPHAGOGASTRODUODENOSCOPY N/A 02/14/2017   Procedure: ESOPHAGOGASTRODUODENOSCOPY (EGD);  Surgeon: Doran Stabler, MD;  Location: [facility] ENDOSCOPY;  Service: Endoscopy;  Laterality: N/A;  . EYE SURGERY  2006   BILATERAL CAT EXT.   Marland Kitchen GIVENS CAPSULE STUDY N/A 02/15/2017   Procedure: GIVENS CAPSULE STUDY;  Surgeon: Doran Stabler, MD;  Location: [place];  Service: Endoscopy;  Laterality: N/A;  . GROIN MASS OPEN BIOPSY  2004  . JOINT REPLACEMENT  2012    BILATERAL KNEES  . JOINT REPLACEMENT  2010    RIGHT HIP REPLACEMENT  . NECK SURGERY  12/08   Due  for repeat neck surgery  . REVERSE SHOULDER ARTHROPLASTY  02/26/2012   Procedure: REVERSE SHOULDER ARTHROPLASTY;  Surgeon: Marin Shutter, MD;  Location: [place];  Service: Orthopedics;  Laterality: Right;  RIGHT SHOULDER REVERSED ARTHROPLASTY  . SHOULDER ARTHROSCOPY WITH SUBACROMIAL DECOMPRESSION Left 02/10/2013   Procedure: SHOULDER ARTHROSCOPY WITH SUBACROMIAL DECOMPRESSION DISTAL CLAVICLE RESECTION;  Surgeon: Marin Shutter, MD;  Location: [place];  Service: Orthopedics;  Laterality: Left;  DISTAL CLAVICLE RESECTION  . TOTAL  HIP ARTHROPLASTY  2007   Right     Social History:  The patient  reports that she quit smoking about 14 years ago. She quit after 45.00 years of use. She has never used smokeless tobacco. She reports that she does not drink alcohol or use drugs.   Family History:  The patient's family history includes COPD in her father; Cancer in her mother, sister, and unknown relative; Colitis in her unknown relative; Heart disease in her father, sister, and unknown relative; Lung cancer in her father; Ovarian cancer in her mother.    ROS:  Please see the history of present illness. All other systems are reviewed and  Negative to the above problem except as noted.    PHYSICAL EXAM: VS:  BP 136/78   Pulse 99   Ht 5\' 8"  (1.727 m)   Wt 175 lb 8 oz (79.6 kg)   SpO2 93%   BMI 26.68 kg/m   GEN: Well nourished, well developed, in no acute distress  HEENT: normal  Neck: no JVD, carotid bruits, or masses Cardiac: RRR; no murmurs, rubs, or gallops,1+dema  Respiratory:  clear to auscultation bilaterally, normal work of breathing GI: soft, nontender, nondistended, + BS  No hepatomegaly  MS: no deformity Moving all extremities   Skin: warm and dry, no rash Neuro:  Strength and sensation are intact Psych: euthymic mood, full affect   EKG:  EKG is not ordered today.   Lipid Panel    Component Value Date/Time   CHOL 103 02/06/2017 1558   CHOL 98 05/23/2014 0857   TRIG 68 02/06/2017  1558   TRIG 83 05/23/2014 0857   HDL 51 02/06/2017 1558   HDL 35 (L) 05/23/2014 0857   CHOLHDL 2.0 02/06/2017 1558   CHOLHDL 4 03/03/2013 1136   VLDL 24.4 03/03/2013 1136   LDLCALC 38 02/06/2017 1558   LDLCALC 46 05/23/2014 0857      Wt Readings from Last 3 Encounters:  08/17/17 175 lb 8 oz (79.6 kg)  06/26/17 170 lb (77.1 kg)  05/26/17 160 lb (72.6 kg)      ASSESSMENT AND PLAN:  1  CAD   S/p CABG 2004  Stent in 2010  Having some symtpoms   I would check labs (CBC, BNP, BMET, TSH)  Will set up for Lexiscan myovue  2  SOB  As above    3  HL  LDL in  Good  Keep on same regimen  4  Hypothyroidism  Check TSH  F/U in clinic end October, early November     Current medicines are reviewed at length with the patient today.  The patient does not have concerns regarding medicines.  Signed, Dorris Carnes, MD  08/17/2017 10:01 AM    [address], [place], [place]  [address] Phone: [phone]; Fax: [phone]

## 2017-08-17 ENCOUNTER — Ambulatory Visit (INDEPENDENT_AMBULATORY_CARE_PROVIDER_SITE_OTHER): Payer: Medicare Other | Admitting: Internal Medicine

## 2017-08-17 VITALS — BP 136/78 | HR 99 | Ht 68.0 in | Wt 175.5 lb

## 2017-08-17 DIAGNOSIS — I25810 Atherosclerosis of coronary artery bypass graft(s) without angina pectoris: Secondary | ICD-10-CM

## 2017-08-17 DIAGNOSIS — E039 Hypothyroidism, unspecified: Secondary | ICD-10-CM

## 2017-08-17 DIAGNOSIS — R0609 Other forms of dyspnea: Secondary | ICD-10-CM

## 2017-08-17 DIAGNOSIS — I209 Angina pectoris, unspecified: Secondary | ICD-10-CM | POA: Diagnosis not present

## 2017-08-17 DIAGNOSIS — I10 Essential (primary) hypertension: Secondary | ICD-10-CM

## 2017-08-17 DIAGNOSIS — I25709 Atherosclerosis of coronary artery bypass graft(s), unspecified, with unspecified angina pectoris: Secondary | ICD-10-CM | POA: Diagnosis not present

## 2017-08-17 DIAGNOSIS — R0600 Dyspnea, unspecified: Secondary | ICD-10-CM

## 2017-08-17 NOTE — Patient Instructions (Addendum)
Your physician recommends that you continue on your current medications as directed. Please refer to the Current Medication list given to you today.  Your physician recommends that you return for lab work today (CBC, BMET, BNP, TSH)  Your physician has requested that you have a lexiscan myoview. For further information please visit [email or web address]. Please follow instruction sheet, as given.  Your physician recommends that you schedule a follow-up appointment in: END OF OCT/EARLY [place]

## 2017-08-18 LAB — BASIC METABOLIC PANEL
BUN/Creatinine Ratio: 13 (ref 12–28)
BUN: 12 mg/dL (ref 8–27)
CO2: 27 mmol/L (ref 20–29)
Calcium: 9.6 mg/dL (ref 8.7–10.3)
Chloride: 102 mmol/L (ref 96–106)
Creatinine, Ser: 0.89 mg/dL (ref 0.57–1.00)
GFR calc Af Amer: 72 mL/min/{1.73_m2} (ref >59)
GFR calc non Af Amer: 63 mL/min/{1.73_m2} (ref >59)
Glucose: 96 mg/dL (ref 65–99)
Potassium: 3.7 mmol/L (ref 3.5–5.2)
Sodium: 145 mmol/L — ABNORMAL HIGH (ref 134–144)

## 2017-08-18 LAB — PRO B NATRIURETIC PEPTIDE: NT-Pro BNP: 612 pg/mL (ref 0–738)

## 2017-08-18 LAB — CBC
Hematocrit: 38.2 % (ref 34.0–46.6)
Hemoglobin: 12.9 g/dL (ref 11.1–15.9)
MCH: 29.9 pg (ref 26.6–33.0)
MCHC: 33.8 g/dL (ref 31.5–35.7)
MCV: 88 fL (ref 79–97)
Platelets: 237 10*3/uL (ref 150–379)
RBC: 4.32 x10E6/uL (ref 3.77–5.28)
RDW: 15.7 % — ABNORMAL HIGH (ref 12.3–15.4)
WBC: 8.8 10*3/uL (ref 3.4–10.8)

## 2017-08-18 LAB — TSH: TSH: 10.23 u[IU]/mL — ABNORMAL HIGH (ref 0.450–4.500)

## 2017-08-19 ENCOUNTER — Other Ambulatory Visit: Payer: Self-pay

## 2017-08-19 ENCOUNTER — Other Ambulatory Visit: Payer: Self-pay | Admitting: *Deleted

## 2017-08-19 DIAGNOSIS — J441 Chronic obstructive pulmonary disease with (acute) exacerbation: Secondary | ICD-10-CM

## 2017-08-19 DIAGNOSIS — E039 Hypothyroidism, unspecified: Secondary | ICD-10-CM

## 2017-08-19 NOTE — Patient Outreach (Signed)
[facility]) Care Management  08/19/2017  [name] [birth year] [id number]   Patient reports she has not felt good for about 2 weeks.  She reports increased SOB with minimal exertion, fatigue, and edema of her lower extremities (up to her knees).  She reports her weight is 175 pounds, up from 160 in June.  Patient reports seeing her cardiologist yesterday.  She reports that the cardiologist is ordering a stress test, but that she did not increase her diuretic.  Patient reports she is still using her Oxygen primarily at night, and that SOB resolves if she sits down and rests for a few minutes.  Teaching was provided on low salt dietary guidelines.  Patient admits to adding salt when she cooks, and adding salt at the table.  Patient was also encouraged to elevate her feet when sitting.    Patient was strongly encouraged to contact her PCP and/or her pulmonologist today to report symptoms of SOB, edema, and increased weight.  Plan:  Patient will contact PCP to report symptoms.           Patient will decrease salt intake.           Patient will elevate her feet when sitting.           RN will send message to PCP re patient report.           RN will follow up in September.  Candie Mile, RN, MSN [place] [phone] Fax [phone]

## 2017-08-27 ENCOUNTER — Telehealth (HOSPITAL_COMMUNITY): Payer: Self-pay | Admitting: *Deleted

## 2017-08-27 ENCOUNTER — Other Ambulatory Visit: Payer: Self-pay

## 2017-08-27 VITALS — Wt 173.0 lb

## 2017-08-27 DIAGNOSIS — J441 Chronic obstructive pulmonary disease with (acute) exacerbation: Secondary | ICD-10-CM

## 2017-08-27 NOTE — Telephone Encounter (Signed)
Patient given detailed instructions per Myocardial Perfusion Study Information Sheet for the test on 08/31/17. Patient notified to arrive 15 minutes early and that it is imperative to arrive on time for appointment to keep from having the test rescheduled.  If you need to cancel or reschedule your appointment, please call the office within 24 hours of your appointment. . Patient verbalized understanding. [name]    

## 2017-08-27 NOTE — Patient Outreach (Signed)
[facility]) Care Management  08/27/2017  AKIKO SCHEXNIDER 1941-12-13 [id number]  Follow up call from last week's contact.  Last week patient reported increased SOB, edema, and weight gain.  Patient was strongly encouraged to contact her MD to report symptoms, and RN sent report to MD as well.  Patient reports today that she did not call her MD last week, but that she is feeling better.  Her weight today is down 3 pounds from last week.  She reports less edema and less SOB.  She also states that she is going next week for some heart tests.  Plan: Patient will use COPD Action Plan to manage respiratory status.          Patient will continue efforts to decrease salt in her diet.          Patient will weigh daily and monitor pedal edema.          Patient will elevate feet when sitting.          RN will follow up in October.  Candie Mile, RN, MSN [place] [phone] Fax [phone]

## 2017-08-31 ENCOUNTER — Other Ambulatory Visit: Payer: Medicare Other | Admitting: *Deleted

## 2017-08-31 ENCOUNTER — Ambulatory Visit (HOSPITAL_COMMUNITY): Payer: Medicare Other | Attending: Cardiovascular Disease

## 2017-08-31 DIAGNOSIS — I10 Essential (primary) hypertension: Secondary | ICD-10-CM | POA: Diagnosis not present

## 2017-08-31 DIAGNOSIS — R0609 Other forms of dyspnea: Secondary | ICD-10-CM | POA: Diagnosis not present

## 2017-08-31 DIAGNOSIS — I25709 Atherosclerosis of coronary artery bypass graft(s), unspecified, with unspecified angina pectoris: Secondary | ICD-10-CM | POA: Diagnosis not present

## 2017-08-31 DIAGNOSIS — R9439 Abnormal result of other cardiovascular function study: Secondary | ICD-10-CM | POA: Insufficient documentation

## 2017-08-31 DIAGNOSIS — R0600 Dyspnea, unspecified: Secondary | ICD-10-CM

## 2017-08-31 DIAGNOSIS — E039 Hypothyroidism, unspecified: Secondary | ICD-10-CM

## 2017-08-31 LAB — MYOCARDIAL PERFUSION IMAGING
LV dias vol: 109 mL (ref 46–106)
LV sys vol: 38 mL
Peak HR: 90 {beats}/min
RATE: 0.25
Rest HR: 68 {beats}/min
SDS: 2
SRS: 4
SSS: 6
TID: 0.91

## 2017-08-31 LAB — T3, FREE: T3, Free: 3.5 pg/mL (ref 2.0–4.4)

## 2017-08-31 LAB — T4, FREE: Free T4: 0.92 ng/dL (ref 0.82–1.77)

## 2017-08-31 MED ORDER — TECHNETIUM TC 99M TETROFOSMIN IV KIT
32.5000 | PACK | Freq: Once | INTRAVENOUS | Status: AC | PRN
  Administered 2017-08-31: 32.5 via INTRAVENOUS
  Filled 2017-08-31: qty 33

## 2017-08-31 MED ORDER — TECHNETIUM TC 99M TETROFOSMIN IV KIT
10.2000 | PACK | Freq: Once | INTRAVENOUS | Status: AC | PRN
  Administered 2017-08-31: 10.2 via INTRAVENOUS
  Filled 2017-08-31: qty 11

## 2017-08-31 MED ORDER — REGADENOSON 0.4 MG/5ML IV SOLN
0.4000 mg | Freq: Once | INTRAVENOUS | Status: AC
  Administered 2017-08-31: 0.4 mg via INTRAVENOUS

## 2017-09-04 ENCOUNTER — Telehealth: Payer: Self-pay | Admitting: *Deleted

## 2017-09-04 NOTE — Telephone Encounter (Signed)
-----   Message from Fay Records, MD sent at 09/02/2017  7:49 PM EDT ----- Left msg on pts phone  Stress test overall looked OK  No evid for significant ischemia Keep on same medicines   Will call back

## 2017-09-04 NOTE — Telephone Encounter (Signed)
Pt has been notified of Myoview results by phone with verbal understanding. Pt aware to continue on her current Tx plan. Pt thanked me for my call today.

## 2017-09-05 ENCOUNTER — Emergency Department (HOSPITAL_COMMUNITY)
Admission: EM | Admit: 2017-09-05 | Discharge: 2017-09-06 | Disposition: A | Discharge status: Home / Self Care | Payer: Medicare Other | Attending: Emergency Medicine | Admitting: Emergency Medicine

## 2017-09-05 ENCOUNTER — Emergency Department (HOSPITAL_COMMUNITY): Payer: Medicare Other

## 2017-09-05 ENCOUNTER — Encounter (HOSPITAL_COMMUNITY): Payer: Self-pay | Admitting: Emergency Medicine

## 2017-09-05 ENCOUNTER — Other Ambulatory Visit: Payer: Self-pay

## 2017-09-05 DIAGNOSIS — R600 Localized edema: Secondary | ICD-10-CM | POA: Insufficient documentation

## 2017-09-05 DIAGNOSIS — I110 Hypertensive heart disease with heart failure: Secondary | ICD-10-CM | POA: Diagnosis not present

## 2017-09-05 DIAGNOSIS — E876 Hypokalemia: Secondary | ICD-10-CM | POA: Diagnosis not present

## 2017-09-05 DIAGNOSIS — R609 Edema, unspecified: Secondary | ICD-10-CM

## 2017-09-05 DIAGNOSIS — E039 Hypothyroidism, unspecified: Secondary | ICD-10-CM | POA: Diagnosis not present

## 2017-09-05 DIAGNOSIS — Z7982 Long term (current) use of aspirin: Secondary | ICD-10-CM | POA: Insufficient documentation

## 2017-09-05 DIAGNOSIS — R112 Nausea with vomiting, unspecified: Secondary | ICD-10-CM | POA: Diagnosis not present

## 2017-09-05 DIAGNOSIS — R079 Chest pain, unspecified: Secondary | ICD-10-CM | POA: Diagnosis not present

## 2017-09-05 DIAGNOSIS — Z87891 Personal history of nicotine dependence: Secondary | ICD-10-CM | POA: Insufficient documentation

## 2017-09-05 DIAGNOSIS — I5032 Chronic diastolic (congestive) heart failure: Secondary | ICD-10-CM | POA: Insufficient documentation

## 2017-09-05 DIAGNOSIS — N3000 Acute cystitis without hematuria: Secondary | ICD-10-CM | POA: Diagnosis not present

## 2017-09-05 DIAGNOSIS — Z951 Presence of aortocoronary bypass graft: Secondary | ICD-10-CM | POA: Insufficient documentation

## 2017-09-05 DIAGNOSIS — I2510 Atherosclerotic heart disease of native coronary artery without angina pectoris: Secondary | ICD-10-CM | POA: Insufficient documentation

## 2017-09-05 DIAGNOSIS — R0602 Shortness of breath: Secondary | ICD-10-CM | POA: Diagnosis not present

## 2017-09-05 DIAGNOSIS — J449 Chronic obstructive pulmonary disease, unspecified: Secondary | ICD-10-CM | POA: Diagnosis not present

## 2017-09-05 DIAGNOSIS — R300 Dysuria: Secondary | ICD-10-CM | POA: Diagnosis present

## 2017-09-05 LAB — URINALYSIS, ROUTINE W REFLEX MICROSCOPIC
Bilirubin Urine: NEGATIVE
Glucose, UA: NEGATIVE mg/dL
Ketones, ur: NEGATIVE mg/dL
Nitrite: NEGATIVE
Protein, ur: NEGATIVE mg/dL
Specific Gravity, Urine: 1.006 (ref 1.005–1.030)
pH: 8 (ref 5.0–8.0)

## 2017-09-05 LAB — BASIC METABOLIC PANEL
Anion gap: 9 (ref 5–15)
BUN: 9 mg/dL (ref 6–20)
CO2: 29 mmol/L (ref 22–32)
Calcium: 8.7 mg/dL — ABNORMAL LOW (ref 8.9–10.3)
Chloride: 101 mmol/L (ref 101–111)
Creatinine, Ser: 0.74 mg/dL (ref 0.44–1.00)
GFR calc Af Amer: >60 mL/min (ref >60)
GFR calc non Af Amer: >60 mL/min (ref >60)
Glucose, Bld: 103 mg/dL — ABNORMAL HIGH (ref 65–99)
Potassium: 2.8 mmol/L — ABNORMAL LOW (ref 3.5–5.1)
Sodium: 139 mmol/L (ref 135–145)

## 2017-09-05 LAB — I-STAT TROPONIN, ED: Troponin i, poc: 0.01 ng/mL (ref 0.00–0.08)

## 2017-09-05 LAB — CBC
HCT: 37.6 % (ref 36.0–46.0)
Hemoglobin: 12 g/dL (ref 12.0–15.0)
MCH: 29.1 pg (ref 26.0–34.0)
MCHC: 31.9 g/dL (ref 30.0–36.0)
MCV: 91 fL (ref 78.0–100.0)
Platelets: 183 10*3/uL (ref 150–400)
RBC: 4.13 MIL/uL (ref 3.87–5.11)
RDW: 14.6 % (ref 11.5–15.5)
WBC: 8.5 10*3/uL (ref 4.0–10.5)

## 2017-09-05 LAB — MAGNESIUM: Magnesium: 1.8 mg/dL (ref 1.7–2.4)

## 2017-09-05 MED ORDER — POTASSIUM CHLORIDE CRYS ER 20 MEQ PO TBCR
40.0000 meq | EXTENDED_RELEASE_TABLET | Freq: Once | ORAL | Status: AC
  Administered 2017-09-05: 40 meq via ORAL
  Filled 2017-09-05: qty 2

## 2017-09-05 MED ORDER — CEPHALEXIN 500 MG PO CAPS: 500.0000 mg | ORAL_CAPSULE | Freq: Four times a day (QID) | ORAL | 0 refills | Status: DC

## 2017-09-05 NOTE — ED Provider Notes (Signed)
Complains of discomfort with urination at suprapubic area for the past one week and also complains of constant anterior chest pain which does not feel like "heart pain" for the past week. Chest pain is constant not made better or worse by anything, slight. She had one episode of vomiting yesterday. Denies feeling nauseated presently.. No fever. No other associated symptoms. On exam she is alert not ill-appearing lungs clear auscultation heart regular rate and rhythm abdomen nondistended nontender   Orlie Dakin, MD 09/05/17 2327

## 2017-09-05 NOTE — ED Provider Notes (Signed)
[facility] DEPT Provider Note   CSN: [id number] Arrival date & time: 09/05/17  1922     History   Chief Complaint Chief Complaint  Patient presents with  . Chest Pain  . Dysuria    HPI [name] is a 76 y.o. female.  Patient with history of CAD (CABG 2004, heart cath 2010), CHF, HTN, GI bleed, HLD, GERD, COPD presents with burning type chest pain for the past one week, cough that is non-productive, nausea with infrequent vomiting. No fever. No change in appetite or activity. The patient lives alone and has been able to care for herself without difficulty. No diarrhea. She reports SOB requiring increased use of her inhaler as well as her home oxygen. She has increased swelling of bilateral lower extremities. She is also having dysuria, suprapubic abdominal discomfort like previous UTI's in the past.    The history is provided by the patient and a relative. No language interpreter was used.  Chest Pain   This is a new problem. The current episode started more than 1 week ago. The problem has not changed since onset.The quality of the pain is described as burning. The pain does not radiate. Associated symptoms include abdominal pain (suprapubic), cough, nausea, shortness of breath and vomiting. Pertinent negatives include no diaphoresis, no fever and no weakness.  Dysuria   Associated symptoms include nausea and vomiting. Pertinent negatives include no chills and no flank pain.    Past Medical History:  Diagnosis Date  . Anemia    bld. transfusion post lumbar surgery- 2012  . Arthralgia    NOS  . Blood transfusion 2011   WITH BACK SURGERY  . CAD (coronary artery disease)    s/p CABG 2004; s/p DES to LM in 2010;  [place] 10/29/11: EF 50-55%, mild elevated filling pressures, no pulmonary HTN, LM 90% ISR, LAD and CFX occluded, S-RI occluded (old), S-OM3 ok and L-LAD ok, native nondominant RCA 95% -  med rx recommended ; Lexiscan Myoview 7/13 at [facility]: demonstrated "normal  LV function, anterior attenuation and localized ischemia, inferior, basilar, mid section"  . Carotid artery occlusion    moderate  . Chronic diastolic heart failure (HCC)    Echo 9/10: EF 56-43%, grade 1 diastolic dysfunction  . COPD (chronic obstructive pulmonary disease) ([place])    Emphysema dxed by Dr. Woody Seller in [place] based on PFTs per pt in 2006; placed on albuterol  . Depression    TAKES CELEXA  AND  (OFF- WELBUTRIN)  . DVT of lower extremity (deep venous thrombosis) (HCC)    recurrent. bilateral (2 episodes)  . Exertional angina (HCC)    Treated with Isosorbide, Ranexa, amlodipine; intolerant to metoprolol  . GERD (gastroesophageal reflux disease)   . HLD (hyperlipidemia)   . Hypertension   . Hyperthyroidism   . Obesity (BMI 30-39.9) 2009   BMI 33  . Osteoarthritis   . PVD (peripheral vascular disease) ([place])   . Sleep apnea 2012   USED CPAP THEN  STARTED USING SPIRIVA , Nov. 2013- last evaluation , changed from mask to aparatus that is just for her nose..     Patient Active Problem List   Diagnosis Date Noted  . Heme positive stool   . Angiodysplasia of intestine   . Hematochezia   . Malnutrition of moderate degree 02/15/2017  . GI bleed 02/13/2017  . Blood loss anemia 02/13/2017  . Impingement syndrome of left shoulder 02/10/2013  . Chest pain 11/17/2011  . PAD (peripheral artery disease) ([place])  11/17/2011  . Dyslipidemia 10/17/2011  . Preop cardiovascular exam 06/19/2011  . CAD (coronary artery disease)   . DEEP VENOUS THROMBOPHLEBITIS 12/27/2008  . Hypothyroidism 05/03/2008  . Essential hypertension 05/03/2008  . EMPHYSEMA 05/03/2008  . OBSTRUCTIVE SLEEP APNEA 05/03/2008  . COPD exacerbation ([place]) 04/12/2008  . SHORTNESS OF BREATH (SOB) 04/12/2008  . SNORING 04/12/2008    Past Surgical History:  Procedure Laterality Date  . ANKLE SURGERY  2004  . [place]   lower; another scheduled, opt. reports 4 back- lumbar, 3 cerv. fusions  for later 2009  .  BREAST ENHANCEMENT SURGERY    . CARDIAC CATHETERIZATION  11/2009   Patent LIMA to LAD and patent SVG to OM1. Occluded SVG to ramus and diagonal. Left main: 90% ostial stenosis, LCX 60-70% proximal stenosis  . COLONOSCOPY WITH PROPOFOL N/A 02/17/2017   Procedure: COLONOSCOPY WITH PROPOFOL;  Surgeon: Jerene Bears, MD;  Location: [facility] ENDOSCOPY;  Service: Endoscopy;  Laterality: N/A;  . CORONARY ANGIOPLASTY WITH STENT PLACEMENT  11/2009   Drug eluting stent to left main artery: 4.0 X 12 mm Ion   . CORONARY ARTERY BYPASS GRAFT  2004  . ESOPHAGOGASTRODUODENOSCOPY N/A 02/14/2017   Procedure: ESOPHAGOGASTRODUODENOSCOPY (EGD);  Surgeon: Doran Stabler, MD;  Location: [facility] ENDOSCOPY;  Service: Endoscopy;  Laterality: N/A;  . EYE SURGERY  2006   BILATERAL CAT EXT.   Marland Kitchen GIVENS CAPSULE STUDY N/A 02/15/2017   Procedure: GIVENS CAPSULE STUDY;  Surgeon: Doran Stabler, MD;  Location: [place];  Service: Endoscopy;  Laterality: N/A;  . GROIN MASS OPEN BIOPSY  2004  . JOINT REPLACEMENT  2012    BILATERAL KNEES  . JOINT REPLACEMENT  2010    RIGHT HIP REPLACEMENT  . NECK SURGERY  12/08   Due for repeat neck surgery  . REVERSE SHOULDER ARTHROPLASTY  02/26/2012   Procedure: REVERSE SHOULDER ARTHROPLASTY;  Surgeon: Marin Shutter, MD;  Location: [place];  Service: Orthopedics;  Laterality: Right;  RIGHT SHOULDER REVERSED ARTHROPLASTY  . SHOULDER ARTHROSCOPY WITH SUBACROMIAL DECOMPRESSION Left 02/10/2013   Procedure: SHOULDER ARTHROSCOPY WITH SUBACROMIAL DECOMPRESSION DISTAL CLAVICLE RESECTION;  Surgeon: Marin Shutter, MD;  Location: [place];  Service: Orthopedics;  Laterality: Left;  DISTAL CLAVICLE RESECTION  . TOTAL HIP ARTHROPLASTY  2007   Right    OB History    No data available       Home Medications    Prior to Admission medications   Medication Sig Start Date End Date Taking? Authorizing Provider  allopurinol (ZYLOPRIM) 100 MG tablet Take 100 mg by mouth daily. 01/30/17   [provider]    aspirin EC 81 MG tablet Take 81 mg by mouth daily.    [provider]  citalopram (CELEXA) 20 MG tablet Take 20 mg by mouth 3 (three) times daily.     [provider]  colchicine 0.6 MG tablet Take 0.6 mg by mouth daily.    [provider]  cyclobenzaprine (FLEXERIL) 10 MG tablet Take 10 mg by mouth 3 (three) times daily as needed for muscle spasms.     [provider]  docusate sodium (COLACE) 100 MG capsule Take 100 mg by mouth daily.    [provider]  ferrous sulfate 325 (65 FE) MG tablet Take 325 mg by mouth daily with breakfast.    [provider]  furosemide (LASIX) 80 MG tablet Take 80 mg by mouth 2 (two) times daily.     [provider]  INCRUSE ELLIPTA 62.5 MCG/INH AEPB Inhale 1 puff into the lungs daily.  11/30/16   [provider]  Ipratropium-Albuterol (COMBIVENT) 20-100 MCG/ACT AERS respimat Inhale 1 puff into the lungs every 6 (six) hours as needed for wheezing.    [provider]  isosorbide mononitrate (IMDUR) 60 MG 24 hr tablet TAKE 1 TABLET AT BEDTIME. 04/24/15   Fay Records, MD  nitroGLYCERIN (NITROSTAT) 0.4 MG SL tablet Place 1 tablet (0.4 mg total) under the tongue every 5 (five) minutes as needed for chest pain. For chest pain 04/24/16   Fay Records, MD  omeprazole (PRILOSEC) 40 MG capsule Take 40 mg by mouth 2 (two) times daily.     [provider]  Oxycodone HCl 10 MG TABS Take 10 mg by mouth every 6 (six) hours as needed for pain. 01/25/17   [provider]  pilocarpine (PILOCAR) 1 % ophthalmic solution Place 1 drop into both eyes 2 (two) times daily. 11/30/16   [provider]  potassium chloride SA (K-DUR,KLOR-CON) 20 MEQ tablet Take 20 mEq by mouth daily.    [provider]  roflumilast (DALIRESP) 500 MCG TABS tablet Take 500 mcg by mouth at bedtime.     [provider]  traZODone (DESYREL) 50 MG tablet Take 150 mg by mouth at bedtime.      [provider]    Family History Family History  Problem Relation Age of Onset  . Ovarian cancer Mother   . Cancer Mother   . Lung cancer Father   . COPD Father   . Heart disease Father   . Cancer Unknown   . Heart disease Unknown   . Colitis Unknown   . Heart disease Sister   . Cancer Sister     Social History Social History  Substance Use Topics  . Smoking status: Former Smoker    Years: 45.00    Quit date: 12/22/2002  . Smokeless tobacco: Never Used  . Alcohol use No     Allergies   Zolpidem tartrate and Penicillins   Review of Systems Review of Systems  Constitutional: Negative for activity change, appetite change, chills, diaphoresis and fever.  HENT: Negative.  Negative for congestion and sore throat.   Respiratory: Positive for cough and shortness of breath.   Cardiovascular: Positive for chest pain and leg swelling.  Gastrointestinal: Positive for abdominal pain (suprapubic), nausea and vomiting. Negative for diarrhea.  Genitourinary: Positive for dysuria. Negative for flank pain.  Musculoskeletal: Negative.  Negative for myalgias.  Skin: Negative.   Neurological: Negative.  Negative for syncope and weakness.     Physical Exam Updated Vital Signs BP 138/62 (BP Location: Left Arm)   Pulse 71   Temp 97.8 F (36.6 C) (Oral)   Resp 17   Ht 5\' 8"  (1.727 m)   Wt 78.5 kg (173 lb)   SpO2 97%   BMI 26.30 kg/m   Physical Exam  Constitutional: She is oriented to person, place, and time. She appears well-developed and well-nourished.  HENT:  Head: Normocephalic.  Eyes: Conjunctivae are normal.  No conjunctival pallor.  Neck: Normal range of motion. Neck supple.  Cardiovascular: Normal rate and regular rhythm.   No murmur heard. No carotid bruit.  Pulmonary/Chest: Effort normal. She has no wheezes. She has rales (Few basilar rales.).  Abdominal: Soft. Bowel sounds are normal. There is tenderness (Mild suprapubic abdominal discomfort.). There  is no rebound and no guarding.  Musculoskeletal: Normal range of motion. She exhibits  edema (1+ pitting edema bilaterally).  Neurological: She is alert and oriented to person, place, and time.  Skin: Skin is warm and dry. No rash noted.  Psychiatric: She has a normal mood and affect.     ED Treatments / Results  Labs (all labs ordered are listed, but only abnormal results are displayed) Labs Reviewed  BASIC METABOLIC PANEL - Abnormal; Notable for the following:       Result Value   Potassium 2.8 (*)    Glucose, Bld 103 (*)    Calcium 8.7 (*)    All other components within normal limits  URINALYSIS, ROUTINE W REFLEX MICROSCOPIC - Abnormal; Notable for the following:    APPearance CLOUDY (*)    Hgb urine dipstick SMALL (*)    Leukocytes, UA MODERATE (*)    Bacteria, UA RARE (*)    Squamous Epithelial / LPF 0-5 (*)    All other components within normal limits  URINE CULTURE  CBC  I-STAT TROPONIN, ED   Results for orders placed or performed during the hospital encounter of 35/57/32  Basic metabolic panel  Result Value Ref Range   Sodium 139 135 - 145 mmol/L   Potassium 2.8 (L) 3.5 - 5.1 mmol/L   Chloride 101 101 - 111 mmol/L   CO2 29 22 - 32 mmol/L   Glucose, Bld 103 (H) 65 - 99 mg/dL   BUN 9 6 - 20 mg/dL   Creatinine, Ser 0.74 0.44 - 1.00 mg/dL   Calcium 8.7 (L) 8.9 - 10.3 mg/dL   GFR calc non Af Amer >60 >60 mL/min   GFR calc Af Amer >60 >60 mL/min   Anion gap 9 5 - 15  CBC  Result Value Ref Range   WBC 8.5 4.0 - 10.5 K/uL   RBC 4.13 3.87 - 5.11 MIL/uL   Hemoglobin 12.0 12.0 - 15.0 g/dL   HCT 37.6 36.0 - 46.0 %   MCV 91.0 78.0 - 100.0 fL   MCH 29.1 26.0 - 34.0 pg   MCHC 31.9 30.0 - 36.0 g/dL   RDW 14.6 11.5 - 15.5 %   Platelets 183 150 - 400 K/uL  Urinalysis, Routine w reflex microscopic  Result Value Ref Range   Color, Urine YELLOW YELLOW   APPearance CLOUDY (A) CLEAR   Specific Gravity, Urine 1.006 1.005 - 1.030   pH 8.0 5.0 - 8.0   Glucose, UA NEGATIVE  NEGATIVE mg/dL   Hgb urine dipstick SMALL (A) NEGATIVE   Bilirubin Urine NEGATIVE NEGATIVE   Ketones, ur NEGATIVE NEGATIVE mg/dL   Protein, ur NEGATIVE NEGATIVE mg/dL   Nitrite NEGATIVE NEGATIVE   Leukocytes, UA MODERATE (A) NEGATIVE   RBC / HPF 6-30 0 - 5 RBC/hpf   WBC, UA TOO NUMEROUS TO COUNT 0 - 5 WBC/hpf   Bacteria, UA RARE (A) NONE SEEN   Squamous Epithelial / LPF 0-5 (A) NONE SEEN   Mucus PRESENT   I-stat troponin, ED  Result Value Ref Range   Troponin i, poc 0.01 0.00 - 0.08 ng/mL   Comment 3             EKG  EKG Interpretation None       Radiology Dg Chest 2 View  Result Date: 09/05/2017 CLINICAL DATA:  Chest pain and shortness of breath EXAM: CHEST  2 VIEW COMPARISON:  February 13, 2017 FINDINGS: There is no edema or consolidation. There is slight scarring in the right apex. Heart size and pulmonary vascular normal. Patient is status post  coronary artery bypass grafting. There is aortic atherosclerosis. No evident adenopathy. There are calcified breast implants bilaterally. There is postoperative change in the lower cervical and upper lumbar regions. There is a total shoulder replacement on the right. There is degenerative change in the thoracic spine. IMPRESSION: Mild scarring right apex. No edema or consolidation. Stable cardiac silhouette. There is aortic atherosclerosis. There are multiple foci of postoperative change. Aortic Atherosclerosis (ICD10-I70.0). Electronically Signed   By: Lowella Grip III M.D.   On: 09/05/2017 19:47    Procedures Procedures (including critical care time)  Medications Ordered in ED Medications - No data to display   Initial Impression / Assessment and Plan / ED Course  I have reviewed the triage vital signs and the nursing notes.  Pertinent labs & imaging results that were available during my care of the patient were reviewed by me and considered in my medical decision making (see chart for details).     Patient presents  with complaint of dysuria and suprapubic abdominal pain. Also with cough and burning type chest pain. Symptoms x 1 week. She feels SOB and reports increase LE edema with history of CHF.  The patient is very well appearing. CXR does not support infection or acute CHF. She has evidence of a UTI on UA, culture pending. Cardiac evaluation is negative by enzymes, EKG, CXR. Chest discomfort is atypical for ischemia and there is no finding concerning for CHF with the exception of bilateral edema.  Potassium found to be low at 2.8. She is taking potassium 20 mEq once daily. Will increase to twice daily for 4 additional days.  She is examined by Dr. Winfred Leeds and is felt stable for discharge home. Will start on Keflex for UTI.   Final Clinical Impressions(s) / ED Diagnoses   Final diagnoses:  None   1. UTI 2. Nonspecific chest pain 3. Peripheral edema 4. Hypokalemia   New Prescriptions New Prescriptions   No medications on file     Charlann Lange, Hershal Coria 09/05/17 [id number]    Charlann Lange, PA-C 09/05/17 [id number]    Orlie Dakin, MD 09/06/17 [phone]

## 2017-09-05 NOTE — ED Triage Notes (Signed)
Pt c/o burning sensation on her urine and central cp since yesterday with some nausea, vomiting and SOB, no fever or chills.

## 2017-09-05 NOTE — Discharge Instructions (Addendum)
Take Keflex as prescribed. Return to the emergency department if you develop a high fever, have severe pain, uncontrolled vomiting or new concern. Otherwise, follow up with your doctor for recheck of your urine to make sure the infection is cleared. Also, increase your potassium from 20 mEq once daily to twice daily for the next 4 days. This should be rechecked by your doctor in one week as well.

## 2017-09-05 NOTE — ED Notes (Signed)
PA in room

## 2017-09-08 LAB — URINE CULTURE: Culture: 50000 — AB

## 2017-09-09 ENCOUNTER — Telehealth: Payer: Self-pay | Admitting: *Deleted

## 2017-09-09 NOTE — Telephone Encounter (Signed)
Post ED Visit - Positive Culture Follow-up  Culture report reviewed by antimicrobial stewardship pharmacist:  []  Elenor Quinones, Pharm.D. []  Heide Guile, Pharm.D., BCPS AQ-ID [x]  Parks Neptune, Pharm.D., BCPS []  Alycia Rossetti, Pharm.D., BCPS []  [place], Pharm.D., BCPS, AAHIVP []  Legrand Como, Pharm.D., BCPS, AAHIVP []  Salome Arnt, PharmD, BCPS []  Dimitri Ped, PharmD, BCPS []  Vincenza Hews, PharmD, BCPS  Positive urine culture Treated with  Cephalexin, organism sensitive to the same and no further patient follow-up is required at this time.  Harlon Flor [facility] 09/09/2017, 11:58 AM

## 2017-09-14 ENCOUNTER — Encounter: Payer: Self-pay | Admitting: Internal Medicine

## 2017-09-14 ENCOUNTER — Ambulatory Visit (INDEPENDENT_AMBULATORY_CARE_PROVIDER_SITE_OTHER): Payer: Medicare Other | Admitting: Internal Medicine

## 2017-09-14 VITALS — BP 134/72 | HR 69 | Ht 68.0 in | Wt 173.2 lb

## 2017-09-14 DIAGNOSIS — E039 Hypothyroidism, unspecified: Secondary | ICD-10-CM | POA: Diagnosis not present

## 2017-09-14 DIAGNOSIS — E785 Hyperlipidemia, unspecified: Secondary | ICD-10-CM

## 2017-09-14 DIAGNOSIS — R0602 Shortness of breath: Secondary | ICD-10-CM | POA: Diagnosis not present

## 2017-09-14 DIAGNOSIS — I209 Angina pectoris, unspecified: Secondary | ICD-10-CM

## 2017-09-14 DIAGNOSIS — R197 Diarrhea, unspecified: Secondary | ICD-10-CM | POA: Diagnosis not present

## 2017-09-14 DIAGNOSIS — I25709 Atherosclerosis of coronary artery bypass graft(s), unspecified, with unspecified angina pectoris: Secondary | ICD-10-CM | POA: Diagnosis not present

## 2017-09-14 DIAGNOSIS — I25810 Atherosclerosis of coronary artery bypass graft(s) without angina pectoris: Secondary | ICD-10-CM

## 2017-09-14 DIAGNOSIS — R600 Localized edema: Secondary | ICD-10-CM

## 2017-09-14 LAB — BASIC METABOLIC PANEL
BUN/Creatinine Ratio: 13 (ref 12–28)
BUN: 12 mg/dL (ref 8–27)
CO2: 30 mmol/L — ABNORMAL HIGH (ref 20–29)
Calcium: 9.6 mg/dL (ref 8.7–10.3)
Chloride: 101 mmol/L (ref 96–106)
Creatinine, Ser: 0.91 mg/dL (ref 0.57–1.00)
GFR calc Af Amer: 70 mL/min/{1.73_m2} (ref >59)
GFR calc non Af Amer: 61 mL/min/{1.73_m2} (ref >59)
Glucose: 80 mg/dL (ref 65–99)
Potassium: 3.9 mmol/L (ref 3.5–5.2)
Sodium: 144 mmol/L (ref 134–144)

## 2017-09-14 LAB — PRO B NATRIURETIC PEPTIDE: NT-Pro BNP: 503 pg/mL (ref 0–738)

## 2017-09-14 NOTE — Patient Instructions (Signed)
Your physician recommends that you continue on your current medications as directed. Please refer to the Current Medication list given to you today.  Your physician has requested that you have an echocardiogram. Echocardiography is a painless test that uses sound waves to create images of your heart. It provides your doctor with information about the size and shape of your heart and how well your heart's chambers and valves are working. This procedure takes approximately one hour. There are no restrictions for this procedure.  Your physician has recommended that you have a pulmonary function test. Pulmonary Function Tests are a group of tests that measure how well air moves in and out of your lungs.  Your physician recommends that you return for lab work today (BMET, BNP)  Your physician recommends that you schedule a follow-up appointment in: 2 [place].

## 2017-09-14 NOTE — Progress Notes (Signed)
Cardiology Office Note   Date:  09/14/2017   ID:  [name], DOB Dec 01, 1941, MRN [id number]  PCP:  [name], MD  Cardiologist:   Dorris Carnes, MD   F/U of CAD     History of Present Illness: [name] is a 76 y.o. female with a history of CAD (s/p CABG 2004 and DES to [place]. Last LHT 11/12 showed instent restenosis to the LM. LCX was occluded in prox segment. SVG to OM was patient as well as LAD RCA had signif disease but appeared nondominant  The patient was admitted to [facility] in July 2013 with CP [facility] showed localized ischemia in the inferior wall (base, mid) Ranexa was added. Did not tolerate so  metoprolol Norvasc added.  She also has a hsitory of recurrent DVT (on chronic coumadin), HTN, COPD, HL and carotid stenosis.  Pt with history of UGI bleed  EGD with gastric erosions, AVMs in [place].  COlonoscopy with pandiverticulits  Anticoag d/c'd I saw her in August 2018  Breathing a little slower  Gets chest pain with walking  New   Wt is up some  Feet swollen  2 wks   Recomm a lexiscan myovue  This showed a small area of distal anteiror ischemia, could not exclude shifting breast   SInce seen her breatihng is unchanged Does get SOB with house cleaning  It has been like that for awhile No acute change   Current Meds  Medication Sig  . allopurinol (ZYLOPRIM) 100 MG tablet Take 100 mg by mouth daily.  Marland Kitchen aspirin EC 81 MG tablet Take 81 mg by mouth daily.  . citalopram (CELEXA) 20 MG tablet Take 20 mg by mouth 3 (three) times daily.   . colchicine 0.6 MG tablet Take 0.6 mg by mouth daily.  . cyclobenzaprine (FLEXERIL) 10 MG tablet Take 10 mg by mouth 3 (three) times daily as needed for muscle spasms.   Marland Kitchen docusate sodium (COLACE) 100 MG capsule Take 100 mg by mouth daily.  . ferrous sulfate 325 (65 FE) MG tablet Take 325 mg by mouth daily with breakfast.  . furosemide (LASIX) 80 MG tablet Take 80 mg by mouth 2 (two) times daily.   . INCRUSE ELLIPTA  62.5 MCG/INH AEPB Inhale 1 puff into the lungs daily.   . Ipratropium-Albuterol (COMBIVENT) 20-100 MCG/ACT AERS respimat Inhale 1 puff into the lungs every 6 (six) hours as needed for wheezing.  . isosorbide mononitrate (IMDUR) 60 MG 24 hr tablet TAKE 1 TABLET AT BEDTIME.  . nitroGLYCERIN (NITROSTAT) 0.4 MG SL tablet Place 1 tablet (0.4 mg total) under the tongue every 5 (five) minutes as needed for chest pain. For chest pain  . omeprazole (PRILOSEC) 40 MG capsule Take 40 mg by mouth 2 (two) times daily.   . Oxycodone HCl 10 MG TABS Take 10 mg by mouth every 6 (six) hours as needed for pain.  . pilocarpine (PILOCAR) 1 % ophthalmic solution Place 1 drop into both eyes 2 (two) times daily.  . potassium chloride SA (K-DUR,KLOR-CON) 20 MEQ tablet Take 20 mEq by mouth daily.  . roflumilast (DALIRESP) 500 MCG TABS tablet Take 500 mcg by mouth at bedtime.   . traZODone (DESYREL) 50 MG tablet Take 150 mg by mouth at bedtime.    I saw the pt at the endo ofAugust   Based on aove set up for myovue  This showed a small region of distal anterior and apical ischemia, could not exclude  soft tissue attenuation    Allergies:   Zolpidem tartrate and Penicillins   Past Medical History:  Diagnosis Date  . Anemia    bld. transfusion post lumbar surgery- 2012  . Arthralgia    NOS  . Blood transfusion 2011   WITH BACK SURGERY  . CAD (coronary artery disease)    s/p CABG 2004; s/p DES to LM in 2010;  [place] 10/29/11: EF 50-55%, mild elevated filling pressures, no pulmonary HTN, LM 90% ISR, LAD and CFX occluded, S-RI occluded (old), S-OM3 ok and L-LAD ok, native nondominant RCA 95% -  med rx recommended ; Lexiscan Myoview 7/13 at [facility]: demonstrated "normal LV function, anterior attenuation and localized ischemia, inferior, basilar, mid section"  . Carotid artery occlusion    moderate  . Chronic diastolic heart failure (HCC)    Echo 9/10: EF 27-03%, grade 1 diastolic dysfunction  . COPD (chronic obstructive  pulmonary disease) ([place])    Emphysema dxed by Dr. Woody Seller in [place] based on PFTs per pt in 2006; placed on albuterol  . Depression    TAKES CELEXA  AND  (OFF- WELBUTRIN)  . DVT of lower extremity (deep venous thrombosis) (HCC)    recurrent. bilateral (2 episodes)  . Exertional angina (HCC)    Treated with Isosorbide, Ranexa, amlodipine; intolerant to metoprolol  . GERD (gastroesophageal reflux disease)   . HLD (hyperlipidemia)   . Hypertension   . Hyperthyroidism   . Obesity (BMI 30-39.9) 2009   BMI 33  . Osteoarthritis   . PVD (peripheral vascular disease) ([place])   . Sleep apnea 2012   USED CPAP THEN  STARTED USING SPIRIVA , Nov. 2013- last evaluation , changed from mask to aparatus that is just for her nose.Marland Kitchen     Past Surgical History:  Procedure Laterality Date  . ANKLE SURGERY  2004  . [place]   lower; another scheduled, opt. reports 4 back- lumbar, 3 cerv. fusions  for later 2009  . BREAST ENHANCEMENT SURGERY    . CARDIAC CATHETERIZATION  11/2009   Patent LIMA to LAD and patent SVG to OM1. Occluded SVG to ramus and diagonal. Left main: 90% ostial stenosis, LCX 60-70% proximal stenosis  . COLONOSCOPY WITH PROPOFOL N/A 02/17/2017   Procedure: COLONOSCOPY WITH PROPOFOL;  Surgeon: Jerene Bears, MD;  Location: [facility] ENDOSCOPY;  Service: Endoscopy;  Laterality: N/A;  . CORONARY ANGIOPLASTY WITH STENT PLACEMENT  11/2009   Drug eluting stent to left main artery: 4.0 X 12 mm Ion   . CORONARY ARTERY BYPASS GRAFT  2004  . ESOPHAGOGASTRODUODENOSCOPY N/A 02/14/2017   Procedure: ESOPHAGOGASTRODUODENOSCOPY (EGD);  Surgeon: Doran Stabler, MD;  Location: [facility] ENDOSCOPY;  Service: Endoscopy;  Laterality: N/A;  . EYE SURGERY  2006   BILATERAL CAT EXT.   Marland Kitchen GIVENS CAPSULE STUDY N/A 02/15/2017   Procedure: GIVENS CAPSULE STUDY;  Surgeon: Doran Stabler, MD;  Location: [place];  Service: Endoscopy;  Laterality: N/A;  . GROIN MASS OPEN BIOPSY  2004  . JOINT REPLACEMENT  2012     BILATERAL KNEES  . JOINT REPLACEMENT  2010    RIGHT HIP REPLACEMENT  . NECK SURGERY  12/08   Due for repeat neck surgery  . REVERSE SHOULDER ARTHROPLASTY  02/26/2012   Procedure: REVERSE SHOULDER ARTHROPLASTY;  Surgeon: Marin Shutter, MD;  Location: [place];  Service: Orthopedics;  Laterality: Right;  RIGHT SHOULDER REVERSED ARTHROPLASTY  . SHOULDER ARTHROSCOPY WITH SUBACROMIAL DECOMPRESSION Left 02/10/2013   Procedure: SHOULDER  ARTHROSCOPY WITH SUBACROMIAL DECOMPRESSION DISTAL CLAVICLE RESECTION;  Surgeon: Marin Shutter, MD;  Location: [place];  Service: Orthopedics;  Laterality: Left;  DISTAL CLAVICLE RESECTION  . TOTAL HIP ARTHROPLASTY  2007   Right     Social History:  The patient  reports that she quit smoking about 14 years ago. She quit after 45.00 years of use. She has never used smokeless tobacco. She reports that she does not drink alcohol or use drugs.   Family History:  The patient's family history includes COPD in her father; Cancer in her mother, sister, and unknown relative; Colitis in her unknown relative; Heart disease in her father, sister, and unknown relative; Lung cancer in her father; Ovarian cancer in her mother.    ROS:  Please see the history of present illness. All other systems are reviewed and  Negative to the above problem except as noted.    PHYSICAL EXAM: VS:  BP 134/72   Pulse 69   Ht 5' 8" (1.727 m)   Wt 173 lb 3.2 oz (78.6 kg)   SpO2 94%   BMI 26.33 kg/m   GEN: Well nourished, well developed, in no acute distress  HEENT: normal  Neck: no JVD, carotid bruits, or masses Cardiac: RRR; no murmurs, rubs, or gallops,1+dema  Respiratory:  clear to auscultation  Some decreased airflow with wheezes   GI: soft, nontender, nondistended, + BS  No hepatomegaly  MS: no deformity Moving all extremities   Skin: warm and dry, no rash Neuro:  Strength and sensation are intact Psych: euthymic mood, full affect   EKG:  EKG is not ordered today.   Lipid Panel      Component Value Date/Time   CHOL 103 02/06/2017 1558   CHOL 98 05/23/2014 0857   TRIG 68 02/06/2017 1558   TRIG 83 05/23/2014 0857   HDL 51 02/06/2017 1558   HDL 35 (L) 05/23/2014 0857   CHOLHDL 2.0 02/06/2017 1558   CHOLHDL 4 03/03/2013 1136   VLDL 24.4 03/03/2013 1136   LDLCALC 38 02/06/2017 1558   LDLCALC 46 05/23/2014 0857      Wt Readings from Last 3 Encounters:  09/14/17 173 lb 3.2 oz (78.6 kg)  09/05/17 173 lb (78.5 kg)  08/31/17 175 lb (79.4 kg)      ASSESSMENT AND PLAN:  1  CAD   S/p CABG 2004  Stent in 2010  Having some symtpoms    Myovuew was low risk Distal ischemia vs shiifintg soft tissue    Talking to her today I dont get ipression for acute changes Does have some wheezing   Will get MET and BNP  Check PFTs   Take Imdur in AM  (she takes at night)   2  SOB  As above    3  HL  LDL in   Keep on same regimen  4  Hypothyroidism  TSH high but T3, ^4 nornal  Will need to follow    F/U in 2 monts    Current medicines are reviewed at length with the patient today.  The patient does not have concerns regarding medicines.  Signed, Dorris Carnes, MD  09/14/2017 8:43 AM    [facility], [place], [place]  [address] Phone: [phone]; Fax: [phone]

## 2017-09-16 DIAGNOSIS — R197 Diarrhea, unspecified: Secondary | ICD-10-CM | POA: Diagnosis not present

## 2017-09-16 DIAGNOSIS — N390 Urinary tract infection, site not specified: Secondary | ICD-10-CM | POA: Diagnosis not present

## 2017-09-21 ENCOUNTER — Ambulatory Visit (HOSPITAL_COMMUNITY): Payer: Medicare Other | Attending: Internal Medicine

## 2017-09-21 ENCOUNTER — Other Ambulatory Visit: Payer: Self-pay

## 2017-09-21 DIAGNOSIS — I5032 Chronic diastolic (congestive) heart failure: Secondary | ICD-10-CM | POA: Diagnosis not present

## 2017-09-21 DIAGNOSIS — I739 Peripheral vascular disease, unspecified: Secondary | ICD-10-CM | POA: Insufficient documentation

## 2017-09-21 DIAGNOSIS — E0590 Thyrotoxicosis, unspecified without thyrotoxic crisis or storm: Secondary | ICD-10-CM | POA: Diagnosis not present

## 2017-09-21 DIAGNOSIS — Z951 Presence of aortocoronary bypass graft: Secondary | ICD-10-CM | POA: Diagnosis not present

## 2017-09-21 DIAGNOSIS — E785 Hyperlipidemia, unspecified: Secondary | ICD-10-CM | POA: Diagnosis not present

## 2017-09-21 DIAGNOSIS — I080 Rheumatic disorders of both mitral and aortic valves: Secondary | ICD-10-CM | POA: Insufficient documentation

## 2017-09-21 DIAGNOSIS — I110 Hypertensive heart disease with heart failure: Secondary | ICD-10-CM | POA: Insufficient documentation

## 2017-09-21 DIAGNOSIS — R0600 Dyspnea, unspecified: Secondary | ICD-10-CM | POA: Diagnosis present

## 2017-09-21 DIAGNOSIS — G4730 Sleep apnea, unspecified: Secondary | ICD-10-CM | POA: Diagnosis not present

## 2017-09-21 DIAGNOSIS — R600 Localized edema: Secondary | ICD-10-CM | POA: Insufficient documentation

## 2017-09-21 DIAGNOSIS — J449 Chronic obstructive pulmonary disease, unspecified: Secondary | ICD-10-CM | POA: Insufficient documentation

## 2017-09-21 DIAGNOSIS — I2510 Atherosclerotic heart disease of native coronary artery without angina pectoris: Secondary | ICD-10-CM | POA: Insufficient documentation

## 2017-09-21 DIAGNOSIS — R0602 Shortness of breath: Secondary | ICD-10-CM | POA: Diagnosis not present

## 2017-09-24 ENCOUNTER — Other Ambulatory Visit: Payer: Self-pay

## 2017-09-24 NOTE — Patient Outreach (Signed)
[place] [facility]) Care Management  09/24/2017  [name] 07-Apr-1941 [id number]  Unsuccessful attempt to reach patient by phone.  HIPAA appropriate message left.  Patient will be scheduled for follow-up call in October.  Candie Mile, RN, MSN [place] [phone] Fax [phone]

## 2017-09-25 ENCOUNTER — Ambulatory Visit: Payer: Self-pay

## 2017-09-29 ENCOUNTER — Encounter: Payer: Self-pay | Admitting: *Deleted

## 2017-09-29 ENCOUNTER — Ambulatory Visit (INDEPENDENT_AMBULATORY_CARE_PROVIDER_SITE_OTHER): Payer: Medicare Other | Admitting: Internal Medicine

## 2017-09-29 DIAGNOSIS — R0602 Shortness of breath: Secondary | ICD-10-CM | POA: Diagnosis not present

## 2017-09-29 DIAGNOSIS — R600 Localized edema: Secondary | ICD-10-CM

## 2017-09-29 LAB — PULMONARY FUNCTION TEST
DL/VA % pred: 83 %
DL/VA: 4.32 ml/min/mmHg/L
DLCO cor % pred: 51 %
DLCO cor: 14.67 ml/min/mmHg
DLCO unc % pred: 56 %
DLCO unc: 16.04 ml/min/mmHg
FEF 25-75 Post: 0.5 L/sec
FEF 25-75 Pre: 0.3 L/sec
FEF2575-%Change-Post: 67 %
FEF2575-%Pred-Post: 28 %
FEF2575-%Pred-Pre: 17 %
FEV1-%Change-Post: 16 %
FEV1-%Pred-Post: 43 %
FEV1-%Pred-Pre: 36 %
FEV1-Post: 1.01 L
FEV1-Pre: 0.86 L
FEV1FVC-%Change-Post: 4 %
FEV1FVC-%Pred-Pre: 67 %
FEV6-%Change-Post: 17 %
FEV6-%Pred-Post: 63 %
FEV6-%Pred-Pre: 54 %
FEV6-Post: 1.87 L
FEV6-Pre: 1.59 L
FEV6FVC-%Change-Post: 1 %
FEV6FVC-%Pred-Post: 102 %
FEV6FVC-%Pred-Pre: 101 %
FVC-%Change-Post: 12 %
FVC-%Pred-Post: 62 %
FVC-%Pred-Pre: 55 %
FVC-Post: 1.92 L
FVC-Pre: 1.71 L
Post FEV1/FVC ratio: 52 %
Post FEV6/FVC ratio: 98 %
Pre FEV1/FVC ratio: 50 %
Pre FEV6/FVC Ratio: 96 %

## 2017-09-29 NOTE — Progress Notes (Signed)
PFT done today. 

## 2017-10-06 ENCOUNTER — Other Ambulatory Visit: Payer: Self-pay | Admitting: *Deleted

## 2017-10-06 NOTE — Patient Outreach (Signed)
[place] [facility]) Care Management  10/06/2017  [name] July 10, 1941 [id number]  RN Health Coach Monthly Outreach  Referral Date: 02/23/2017 Referral Source: EMMI COPD program/Hospital admission Reason for Referral: EMMI COPD alert Insurance: Medicare   Outreach Attempt: 2nd telephone outreach attempt for monthly assessment.  Patient stated she was about to leave and request a return call back next Monday afternoon.     Plan:  RN Health Coach will attempt another outreach within 10 business days.   [place] [phone] [name]

## 2017-10-12 ENCOUNTER — Other Ambulatory Visit: Payer: Self-pay | Admitting: *Deleted

## 2017-10-12 ENCOUNTER — Encounter: Payer: Self-pay | Admitting: *Deleted

## 2017-10-12 NOTE — Patient Outreach (Signed)
[facility]) Care Management  10/12/2017  [name] 27-Dec-1940 [id number]   RN Health Coach Monthly Outreach  Referral Date: 02/23/2017 Referral Source: EMMI COPD program/Hospital admission Reason for Referral: EMMI COPD alert Insurance: Medicare  Outreach Attempt: Successful monthly outreach attempt.  Patient states she has recently recovered from urinary tract infection and has completed her course of antibiotics.  States she feels well.  Has had increasing shortness of breath, requiring the use of her oxygen during the daytime a little more frequently than normal.  She has been following with her Cardiologist and Pulmonologist the past month for the increasing shortness of breath.  Patient has had follow up test, PFT's, negative stress test, and an echocardiogram.  She has had increasing right hip pain and is having difficulty getting around related to the hip pain.  Reports weight this morning is 175 pounds and swelling in her feet is usually down in the morning times.  Appetite is good.  Patient does report lest chest tightness with now taking her Imdur in the morning as instructed by her Cardiologist.  Appointments: Patient has appointment with her Primary Care Physician on this coming Wednesday, October 24.  Encouraged patient to discuss her increasing shortness of breath and increasing hip pain.  Patient reports she will receive her flu shot at this weeks appointment.  Plan:  RN Health Coach will contact patient in the month of November.   [place] [phone] [name]

## 2017-10-14 DIAGNOSIS — Z79899 Other long term (current) drug therapy: Secondary | ICD-10-CM | POA: Diagnosis not present

## 2017-10-14 DIAGNOSIS — G894 Chronic pain syndrome: Secondary | ICD-10-CM | POA: Diagnosis not present

## 2017-10-14 DIAGNOSIS — Z23 Encounter for immunization: Secondary | ICD-10-CM | POA: Diagnosis not present

## 2017-10-14 DIAGNOSIS — Z5181 Encounter for therapeutic drug level monitoring: Secondary | ICD-10-CM | POA: Diagnosis not present

## 2017-10-22 ENCOUNTER — Ambulatory Visit: Payer: Medicare Other | Admitting: Internal Medicine

## 2017-11-04 ENCOUNTER — Encounter: Payer: Self-pay | Admitting: *Deleted

## 2017-11-04 ENCOUNTER — Other Ambulatory Visit: Payer: Self-pay | Admitting: *Deleted

## 2017-11-04 NOTE — Patient Outreach (Addendum)
[place] [facility]) Care Management  [place]  11/04/2017   [name] Jun 03, 1941 [id number]  RN Health Coach Monthly Outreach   Referral Date: 02/23/2017 Referral Source: EMMI COPD program/Hospital admission Reason for Referral: EMMI COPD alert Insurance: Medicare  Outreach Attempt: Successful telephone outreach to patient.  HIPAA verified.  Patient stating she continues to do well.  No emergency room visits per report.  No COPD exacerbations noted per patient.  Patient does state she has had to use her rescue inhaler every morning.  Continues her oxygen at 2 l/m nightly, has not had to use her oxygen much during the daytime.  Patient states she continues to have right hip pain, more severe with activity.  Continues to take percocet for pain with relief.  Patient again encouraged to discuss increasing pain with her primary care provider and orthopedic physician.  Continues to drive and ambulates with walker.  Denies any falls.  Encounter Medications:  Outpatient Encounter Medications as of 11/04/2017  Medication Sig  . allopurinol (ZYLOPRIM) 100 MG tablet Take 100 mg by mouth daily.  Marland Kitchen aspirin EC 81 MG tablet Take 81 mg by mouth daily.  . citalopram (CELEXA) 20 MG tablet Take 20 mg by mouth 3 (three) times daily.   . colchicine 0.6 MG tablet Take 0.6 mg by mouth daily.  . cyclobenzaprine (FLEXERIL) 10 MG tablet Take 10 mg by mouth 3 (three) times daily as needed for muscle spasms.   Marland Kitchen docusate sodium (COLACE) 100 MG capsule Take 100 mg by mouth daily.  . ferrous sulfate 325 (65 FE) MG tablet Take 325 mg by mouth daily with breakfast.  . furosemide (LASIX) 80 MG tablet Take 80 mg by mouth 2 (two) times daily.   . INCRUSE ELLIPTA 62.5 MCG/INH AEPB Inhale 1 puff into the lungs daily.   . Ipratropium-Albuterol (COMBIVENT) 20-100 MCG/ACT AERS respimat Inhale 1 puff into the lungs every 6 (six) hours as needed for wheezing.  . isosorbide mononitrate (IMDUR) 60 MG 24 hr  tablet TAKE 1 TABLET AT BEDTIME.  . nitroGLYCERIN (NITROSTAT) 0.4 MG SL tablet Place 1 tablet (0.4 mg total) under the tongue every 5 (five) minutes as needed for chest pain. For chest pain  . omeprazole (PRILOSEC) 40 MG capsule Take 40 mg by mouth 2 (two) times daily.   . Oxycodone HCl 10 MG TABS Take 10 mg by mouth every 6 (six) hours as needed for pain.  . pilocarpine (PILOCAR) 1 % ophthalmic solution Place 1 drop into both eyes 2 (two) times daily.  . potassium chloride SA (K-DUR,KLOR-CON) 20 MEQ tablet Take 20 mEq by mouth daily.  . roflumilast (DALIRESP) 500 MCG TABS tablet Take 500 mcg by mouth at bedtime.   . traZODone (DESYREL) 50 MG tablet Take 150 mg by mouth at bedtime.    No facility-administered encounter medications on file as of 11/04/2017.     Functional Status:  In your present state of health, do you have any difficulty performing the following activities: 08/27/2017 03/24/2017  Hearing? N N  Vision? N -  Difficulty concentrating or making decisions? N N  Walking or climbing stairs? Y Y  Dressing or bathing? N N  Doing errands, shopping? [place] and eating ? N N  Using the Toilet? N N  In the past six months, have you accidently leaked urine? N N  Do you have problems with loss of bowel control? N N  Managing your Medications? N  N  Comment - Complains about having too many meds.  Managing your Finances? N N  Housekeeping or managing your Housekeeping? N N  Comment - -  Some recent data might be hidden    Fall/Depression Screening: Fall Risk  11/04/2017 10/12/2017 06/26/2017  Falls in the past year? No No No  Comment - - -  Risk for fall due to : - Impaired mobility -   PHQ 2/9 Scores 11/04/2017 05/26/2017 03/24/2017 03/23/2017 03/13/2017 02/26/2017  PHQ - 2 Score _0 PHQ- 9 Score _1 THN CM Care Plan Problem One     Most Recent Value  Care Plan Problem One  Decrease in activitiy participation related to COPD and hip  pain  Role Documenting the Problem One  [facility] [place] for Problem One  Active  THN Long Term Goal   Patient will continue to avoid hospitalization and emergency room visits in the next 90 days  THN Long Term Goal Start Date  10/12/17  Interventions for Problem One Long Term Goal  Reviewed COPD Action plan and when to call MD versus when to go to emergency room, encouraged patient to continue to follow up with physicians concerning increasing shortness of breath and need to use rescue inhaler every morning, congratulated patient on her doing well with action plan and staying out of the hospital and emergency room  [facility] CM Short Term Goal #1   Patient will report a decrease in right hip pain in the next 30 days.  THN CM Short Term Goal #1 Start Date  11/04/17  Interventions for Short Term Goal #1  Encouraged patient to discuss right hip pain with medical doctor, encouraged patient to follow up with orthopedic physician regarding hip pain,  encouraged patient to increase activity when available to increase goal of weight loss for better management of joint pain   THN CM Short Term Goal #2   Patient will report less usage of her rescue inhaler in the next 30 days  THN CM Short Term Goal #2 Start Date  11/04/17  [facility] CM Short Term Goal #2 Met Date  11/04/17  Interventions for Short Term Goal #2  Encouraged patient to notify her physician of daily morning use of her rescue inhaler,  encouraged patient to monitor triggers of her COPD, ecouraged patient to continue with medicaiton compliance,  Reviewed COPD Action plan with patient and when to go to the emergency room versus when to call MD, reinforced use of the 24 hour nurse line available to patient     Appointments: Patient reports seeing her primary care provider on 10/14/2017.  She received the flu vaccination at this appointment.  She sees Dr. Harrington Challenger with Cardiology on November 19, 2017.  Plan:  RN Health Coach to make monthly telephone  outreach to patient in the month of December.  RN Health Coach will send quarterly update to primary care provider.   [place] [phone] [email or web address]

## 2017-11-08 DIAGNOSIS — K8020 Calculus of gallbladder without cholecystitis without obstruction: Secondary | ICD-10-CM | POA: Diagnosis not present

## 2017-11-08 DIAGNOSIS — R1011 Right upper quadrant pain: Secondary | ICD-10-CM | POA: Diagnosis not present

## 2017-11-08 DIAGNOSIS — I509 Heart failure, unspecified: Secondary | ICD-10-CM | POA: Diagnosis not present

## 2017-11-09 ENCOUNTER — Encounter: Payer: Self-pay | Admitting: Internal Medicine

## 2017-11-10 DIAGNOSIS — K8010 Calculus of gallbladder with chronic cholecystitis without obstruction: Secondary | ICD-10-CM | POA: Diagnosis not present

## 2017-11-10 DIAGNOSIS — R112 Nausea with vomiting, unspecified: Secondary | ICD-10-CM | POA: Diagnosis not present

## 2017-11-10 DIAGNOSIS — I2510 Atherosclerotic heart disease of native coronary artery without angina pectoris: Secondary | ICD-10-CM | POA: Diagnosis not present

## 2017-11-10 DIAGNOSIS — I10 Essential (primary) hypertension: Secondary | ICD-10-CM | POA: Diagnosis not present

## 2017-11-10 DIAGNOSIS — R1011 Right upper quadrant pain: Secondary | ICD-10-CM | POA: Diagnosis not present

## 2017-11-10 DIAGNOSIS — K8020 Calculus of gallbladder without cholecystitis without obstruction: Secondary | ICD-10-CM | POA: Diagnosis not present

## 2017-11-11 DIAGNOSIS — Z951 Presence of aortocoronary bypass graft: Secondary | ICD-10-CM | POA: Diagnosis not present

## 2017-11-11 DIAGNOSIS — Z955 Presence of coronary angioplasty implant and graft: Secondary | ICD-10-CM | POA: Diagnosis not present

## 2017-11-11 DIAGNOSIS — Z9049 Acquired absence of other specified parts of digestive tract: Secondary | ICD-10-CM | POA: Diagnosis not present

## 2017-11-11 DIAGNOSIS — F329 Major depressive disorder, single episode, unspecified: Secondary | ICD-10-CM | POA: Diagnosis not present

## 2017-11-11 DIAGNOSIS — Z8719 Personal history of other diseases of the digestive system: Secondary | ICD-10-CM | POA: Diagnosis not present

## 2017-11-11 DIAGNOSIS — Z87891 Personal history of nicotine dependence: Secondary | ICD-10-CM | POA: Diagnosis not present

## 2017-11-11 DIAGNOSIS — Z888 Allergy status to other drugs, medicaments and biological substances status: Secondary | ICD-10-CM | POA: Diagnosis not present

## 2017-11-11 DIAGNOSIS — I509 Heart failure, unspecified: Secondary | ICD-10-CM | POA: Diagnosis not present

## 2017-11-11 DIAGNOSIS — J449 Chronic obstructive pulmonary disease, unspecified: Secondary | ICD-10-CM | POA: Diagnosis not present

## 2017-11-11 DIAGNOSIS — Z7982 Long term (current) use of aspirin: Secondary | ICD-10-CM | POA: Diagnosis not present

## 2017-11-11 DIAGNOSIS — G4730 Sleep apnea, unspecified: Secondary | ICD-10-CM | POA: Diagnosis not present

## 2017-11-11 DIAGNOSIS — E039 Hypothyroidism, unspecified: Secondary | ICD-10-CM | POA: Diagnosis not present

## 2017-11-11 DIAGNOSIS — I2510 Atherosclerotic heart disease of native coronary artery without angina pectoris: Secondary | ICD-10-CM | POA: Diagnosis not present

## 2017-11-11 DIAGNOSIS — K219 Gastro-esophageal reflux disease without esophagitis: Secondary | ICD-10-CM | POA: Diagnosis not present

## 2017-11-11 DIAGNOSIS — K8020 Calculus of gallbladder without cholecystitis without obstruction: Secondary | ICD-10-CM | POA: Diagnosis not present

## 2017-11-11 DIAGNOSIS — E785 Hyperlipidemia, unspecified: Secondary | ICD-10-CM | POA: Diagnosis not present

## 2017-11-11 DIAGNOSIS — K811 Chronic cholecystitis: Secondary | ICD-10-CM | POA: Diagnosis not present

## 2017-11-11 DIAGNOSIS — F419 Anxiety disorder, unspecified: Secondary | ICD-10-CM | POA: Diagnosis not present

## 2017-11-11 DIAGNOSIS — I110 Hypertensive heart disease with heart failure: Secondary | ICD-10-CM | POA: Diagnosis not present

## 2017-11-11 DIAGNOSIS — Z79899 Other long term (current) drug therapy: Secondary | ICD-10-CM | POA: Diagnosis not present

## 2017-11-11 DIAGNOSIS — Z880 Allergy status to penicillin: Secondary | ICD-10-CM | POA: Diagnosis not present

## 2017-11-19 ENCOUNTER — Ambulatory Visit (INDEPENDENT_AMBULATORY_CARE_PROVIDER_SITE_OTHER): Payer: Medicare Other | Admitting: Internal Medicine

## 2017-11-19 ENCOUNTER — Encounter: Payer: Self-pay | Admitting: Internal Medicine

## 2017-11-19 VITALS — BP 130/60 | HR 78 | Ht 68.0 in | Wt 185.4 lb

## 2017-11-19 DIAGNOSIS — I2510 Atherosclerotic heart disease of native coronary artery without angina pectoris: Secondary | ICD-10-CM

## 2017-11-19 DIAGNOSIS — I25810 Atherosclerosis of coronary artery bypass graft(s) without angina pectoris: Secondary | ICD-10-CM

## 2017-11-19 DIAGNOSIS — E785 Hyperlipidemia, unspecified: Secondary | ICD-10-CM

## 2017-11-19 NOTE — Progress Notes (Signed)
Cardiology Office Note   Date:  11/19/2017   ID:  ADAIR LEMAR, DOB 1941-10-13, MRN [id number]  PCP:  [name], MD  Cardiologist:   Dorris Carnes, MD   F/U of CAD     History of Present Illness: [name] is a 76 y.o. female with a history of CAD (s/p CABG 2004 and DES to [place]. Last LHT 11/12 showed instent restenosis to the LM. LCX was occluded in prox segment. SVG to OM was patient as well as LAD RCA had signif disease but appeared nondominant  The patient was admitted to [facility] in July 2013 with CP [facility] showed localized ischemia in the inferior wall (base, mid) Ranexa was added. Did not tolerate so  metoprolol Norvasc added.  She also has a hsitory of recurrent DVT (on chronic coumadin), HTN, COPD, HL and carotid stenosis.  Pt with history of UGI bleed  EGD with gastric erosions, AVMs in [place].  COlonoscopy with pandiverticulits  Anticoag d/c'd I saw her in August 2018  Breathing a little slower  Gets chest pain with walking  New   Wt is up some  Feet swollen  2 wks   Recomm a lexiscan myovue  This showed a small area of distal anteiror ischemia, could not exclude shifting breast   I saw her earlier this fall  Since then she underwent choley  Feeling a lot better  Denies CP  Breathing is good   Current Meds  Medication Sig  . allopurinol (ZYLOPRIM) 100 MG tablet Take 100 mg by mouth daily.  Marland Kitchen aspirin EC 81 MG tablet Take 81 mg by mouth daily.  . citalopram (CELEXA) 20 MG tablet Take 20 mg by mouth 3 (three) times daily.   . colchicine 0.6 MG tablet Take 0.6 mg by mouth daily.  . cyclobenzaprine (FLEXERIL) 10 MG tablet Take 10 mg by mouth 3 (three) times daily as needed for muscle spasms.   Marland Kitchen docusate sodium (COLACE) 100 MG capsule Take 100 mg by mouth daily.  . ferrous sulfate 325 (65 FE) MG tablet Take 325 mg by mouth daily with breakfast.  . furosemide (LASIX) 80 MG tablet Take 80 mg by mouth 2 (two) times daily.   . INCRUSE ELLIPTA 62.5  MCG/INH AEPB Inhale 1 puff into the lungs daily.   . Ipratropium-Albuterol (COMBIVENT) 20-100 MCG/ACT AERS respimat Inhale 1 puff into the lungs every 6 (six) hours as needed for wheezing.  . isosorbide mononitrate (IMDUR) 120 MG 24 hr tablet Take 120 mg by mouth daily.  Marland Kitchen lisinopril (PRINIVIL,ZESTRIL) 20 MG tablet Take 20 mg by mouth daily.  . nitroGLYCERIN (NITROSTAT) 0.4 MG SL tablet Place 1 tablet (0.4 mg total) under the tongue every 5 (five) minutes as needed for chest pain. For chest pain  . omeprazole (PRILOSEC) 40 MG capsule Take 40 mg by mouth 2 (two) times daily.   . ondansetron (ZOFRAN-ODT) 4 MG disintegrating tablet Take 1 tablet by mouth every 6 (six) hours as needed for nausea/vomiting.  . Oxycodone HCl 10 MG TABS Take 10 mg by mouth every 6 (six) hours as needed for pain.  . pilocarpine (PILOCAR) 1 % ophthalmic solution Place 1 drop into both eyes 2 (two) times daily.  . potassium chloride SA (K-DUR,KLOR-CON) 20 MEQ tablet Take 20 mEq by mouth daily.  . roflumilast (DALIRESP) 500 MCG TABS tablet Take 500 mcg by mouth at bedtime.   . traMADol (ULTRAM) 50 MG tablet Take 1 tablet by mouth every  4 (four) hours as needed for pain.  . traZODone (DESYREL) 50 MG tablet Take 150 mg by mouth at bedtime.    I saw the pt at the endo ofAugust   Based on aove set up for myovue  This showed a small region of distal anterior and apical ischemia, could not exclude soft tissue attenuation    Allergies:   Zolpidem tartrate; Pitavastatin; Ropinirole; and Penicillins   Past Medical History:  Diagnosis Date  . Anemia    bld. transfusion post lumbar surgery- 2012  . Arthralgia    NOS  . Blood transfusion 2011   WITH BACK SURGERY  . CAD (coronary artery disease)    s/p CABG 2004; s/p DES to LM in 2010;  Index 10/29/11: EF 50-55%, mild elevated filling pressures, no pulmonary HTN, LM 90% ISR, LAD and CFX occluded, S-RI occluded (old), S-OM3 ok and L-LAD ok, native nondominant RCA 95% -  med rx  recommended ; Lexiscan Myoview 7/13 at [facility]: demonstrated "normal LV function, anterior attenuation and localized ischemia, inferior, basilar, mid section"  . Carotid artery occlusion    moderate  . Chronic diastolic heart failure (HCC)    Echo 9/10: EF 85-46%, grade 1 diastolic dysfunction  . COPD (chronic obstructive pulmonary disease) ([place])    Emphysema dxed by Dr. Woody Seller in [place] based on PFTs per pt in 2006; placed on albuterol  . Depression    TAKES CELEXA  AND  (OFF- WELBUTRIN)  . DVT of lower extremity (deep venous thrombosis) (HCC)    recurrent. bilateral (2 episodes)  . Exertional angina (HCC)    Treated with Isosorbide, Ranexa, amlodipine; intolerant to metoprolol  . GERD (gastroesophageal reflux disease)   . HLD (hyperlipidemia)   . Hypertension   . Hyperthyroidism   . Obesity (BMI 30-39.9) 2009   BMI 33  . Osteoarthritis   . PVD (peripheral vascular disease) ([place])   . Sleep apnea 2012   USED CPAP THEN  STARTED USING SPIRIVA , Nov. 2013- last evaluation , changed from mask to aparatus that is just for her nose.Marland Kitchen     Past Surgical History:  Procedure Laterality Date  . ANKLE SURGERY  2004  . [place]   lower; another scheduled, opt. reports 4 back- lumbar, 3 cerv. fusions  for later 2009  . BREAST ENHANCEMENT SURGERY    . CARDIAC CATHETERIZATION  11/2009   Patent LIMA to LAD and patent SVG to OM1. Occluded SVG to ramus and diagonal. Left main: 90% ostial stenosis, LCX 60-70% proximal stenosis  . COLONOSCOPY WITH PROPOFOL N/A 02/17/2017   Procedure: COLONOSCOPY WITH PROPOFOL;  Surgeon: Jerene Bears, MD;  Location: [facility] ENDOSCOPY;  Service: Endoscopy;  Laterality: N/A;  . CORONARY ANGIOPLASTY WITH STENT PLACEMENT  11/2009   Drug eluting stent to left main artery: 4.0 X 12 mm Ion   . CORONARY ARTERY BYPASS GRAFT  2004  . ESOPHAGOGASTRODUODENOSCOPY N/A 02/14/2017   Procedure: ESOPHAGOGASTRODUODENOSCOPY (EGD);  Surgeon: Doran Stabler, MD;  Location: [facility]  ENDOSCOPY;  Service: Endoscopy;  Laterality: N/A;  . EYE SURGERY  2006   BILATERAL CAT EXT.   Marland Kitchen GIVENS CAPSULE STUDY N/A 02/15/2017   Procedure: GIVENS CAPSULE STUDY;  Surgeon: Doran Stabler, MD;  Location: [place];  Service: Endoscopy;  Laterality: N/A;  . GROIN MASS OPEN BIOPSY  2004  . JOINT REPLACEMENT  2012    BILATERAL KNEES  . JOINT REPLACEMENT  2010    RIGHT HIP REPLACEMENT  .  NECK SURGERY  12/08   Due for repeat neck surgery  . REVERSE SHOULDER ARTHROPLASTY  02/26/2012   Procedure: REVERSE SHOULDER ARTHROPLASTY;  Surgeon: Marin Shutter, MD;  Location: [place];  Service: Orthopedics;  Laterality: Right;  RIGHT SHOULDER REVERSED ARTHROPLASTY  . SHOULDER ARTHROSCOPY WITH SUBACROMIAL DECOMPRESSION Left 02/10/2013   Procedure: SHOULDER ARTHROSCOPY WITH SUBACROMIAL DECOMPRESSION DISTAL CLAVICLE RESECTION;  Surgeon: Marin Shutter, MD;  Location: [place];  Service: Orthopedics;  Laterality: Left;  DISTAL CLAVICLE RESECTION  . TOTAL HIP ARTHROPLASTY  2007   Right     Social History:  The patient  reports that she quit smoking about 14 years ago. She quit after 45.00 years of use. she has never used smokeless tobacco. She reports that she does not drink alcohol or use drugs.   Family History:  The patient's family history includes COPD in her father; Cancer in her mother, sister, and unknown relative; Colitis in her unknown relative; Heart disease in her father, sister, and unknown relative; Lung cancer in her father; Ovarian cancer in her mother.    ROS:  Please see the history of present illness. All other systems are reviewed and  Negative to the above problem except as noted.    PHYSICAL EXAM: VS:  BP 130/60   Pulse 78   Ht 5\' 8"  (1.727 m)   Wt 185 lb 6.4 oz (84.1 kg)   SpO2 93%   BMI 28.19 kg/m   GEN: Well nourished, well developed, in no acute distress  HEENT: normal  Neck: no JVD, carotid bruits, or masses Cardiac: RRR; no murmurs, rubs, or gallops,1+dema    Respiratory:  clear to auscultation  Some decreased airflow with wheezes   GI: soft, nontender, nondistended, + BS  No hepatomegaly  MS: no deformity Moving all extremities   Skin: warm and dry, no rash Neuro:  Strength and sensation are intact Psych: euthymic mood, full affect   EKG:  EKG is not ordered today.   Lipid Panel    Component Value Date/Time   CHOL 103 02/06/2017 1558   CHOL 98 05/23/2014 0857   TRIG 68 02/06/2017 1558   TRIG 83 05/23/2014 0857   HDL 51 02/06/2017 1558   HDL 35 (L) 05/23/2014 0857   CHOLHDL 2.0 02/06/2017 1558   CHOLHDL 4 03/03/2013 1136   VLDL 24.4 03/03/2013 1136   LDLCALC 38 02/06/2017 1558   LDLCALC 46 05/23/2014 0857      Wt Readings from Last 3 Encounters:  11/19/17 185 lb 6.4 oz (84.1 kg)  09/14/17 173 lb 3.2 oz (78.6 kg)  09/05/17 173 lb (78.5 kg)      ASSESSMENT AND PLAN:  1  CAD   S/p CABG 2004  Stent in 2010  Having some symtpoms    Myovuew was low risk Distal ischemia vs shiifintg soft tissue    Doing good   No angina    Encourage her to stay active   2  SOB Denies    3  HL   COntinue meds   4  Hypothyroidism  Will need to follow  F/U in 2 monts    Current medicines are reviewed at length with the patient today.  The patient does not have concerns regarding medicines.  Signed, Dorris Carnes, MD  11/19/2017 9:38 AM    [address], [place], [place]  [address] Phone: [phone]; Fax: [phone]

## 2017-11-19 NOTE — Patient Instructions (Signed)
Your physician recommends that you continue on your current medications as directed. Please refer to the Current Medication list given to you today. Your physician wants you to follow-up in: 6 months with Dr. Ross.  You will receive a reminder letter in the mail two months in advance. If you don't receive a letter, please call our office to schedule the follow-up appointment.  

## 2017-12-07 ENCOUNTER — Encounter: Payer: Self-pay | Admitting: *Deleted

## 2017-12-07 ENCOUNTER — Other Ambulatory Visit: Payer: Self-pay | Admitting: *Deleted

## 2017-12-07 DIAGNOSIS — Z9049 Acquired absence of other specified parts of digestive tract: Secondary | ICD-10-CM | POA: Insufficient documentation

## 2017-12-07 NOTE — Patient Outreach (Signed)
[facility]) Care Management  12/07/2017  [name] Dec 31, 1940 [id number]  RN Health Coach Monthly Outreach  Referral Date: 02/23/2017 Referral Source: EMMI COPD program/Hospital admission Reason for Referral: EMMI COPD alert Insurance: Medicare  Outreach Attempt:  Successful telephone outreach to patient for monthly follow up.  HIPAA verified.  Patient reporting ED visit in November for severe abdominal pain, resulting in having a laparoscopic cholecystectomy for acute cholecystitis.  States the cholecystectomy was completed at [facility] and it was done outpatient, she did not stay overnight.  Patient states since surgery she has done well.  Tolerating solid foods.  Denies any abdominal pain and having regular bowel movements.  Reports incisions are healing without any open areas.  Verbalizes her granddaughter assisted her with care for a few days after surgery and now she is independent to care for herself.  Still ambulating with walker.  Denies any falls.  Continues to report hip pain, still not addressed with physicians.  Patient also reports using her rescue inhaler 3-4 times a day.  States shortness of breath is better, but still some shortness of breath with activity.  Patient encouraged to discuss her hip pain and rescue inhaler use with her primary care provider, pulmonologist, and orthopedist.  Appointments:  Patient states she received telephone follow up call from the surgeon post surgery and he wanted her to follow up with her primary care provider post surgery.  Verbalizes she has a appointment with Dr. [name] on 12/21/2017.  Plan: RN Health Coach will make next monthly outreach to patient in the month of January.    [place] [phone] [name]

## 2017-12-31 ENCOUNTER — Other Ambulatory Visit: Payer: Self-pay | Admitting: *Deleted

## 2017-12-31 NOTE — Patient Outreach (Signed)
[facility]) Care Management  12/31/2017  [name] 09-29-1941 [id number]  RN Health Coach Monthly Outreach  Referral Date: 02/23/2017 Referral Source: EMMI COPD program/Hospital admission Reason for Referral: EMMI COPD alert Insurance: Medicare  Outreach Attempt:  Outreach attempt #1 to patient for monthly follow up. Patient answered and stated she was sleeping, requesting a call back another day.  Plan:  RN Health Coach will make another monthly telephone outreach in the month of January.  [place] [phone] [email or web address]

## 2018-01-14 ENCOUNTER — Other Ambulatory Visit: Payer: Self-pay | Admitting: *Deleted

## 2018-01-14 DIAGNOSIS — Z79899 Other long term (current) drug therapy: Secondary | ICD-10-CM | POA: Diagnosis not present

## 2018-01-14 DIAGNOSIS — G894 Chronic pain syndrome: Secondary | ICD-10-CM | POA: Diagnosis not present

## 2018-01-14 DIAGNOSIS — Z5181 Encounter for therapeutic drug level monitoring: Secondary | ICD-10-CM | POA: Diagnosis not present

## 2018-01-14 NOTE — Patient Outreach (Signed)
[facility]) Care Management  01/14/2018  [name] Jun 02, 1941 [id number]   RN Health Coach Monthly Outreach  Referral Date: 02/23/2017 Referral Source: EMMI COPD program/Hospital admission Reason for Referral: EMMI COPD alert Insurance: Medicare  Outreach Attempt:  Outreach attempt #2 to patient for monthly follow up. No answer. RN Health Coach unable to leave voice message, voicemail system did not pick up.  Plan:  RN Health Coach will attempt another monthly outreach to patient in the month of February.  [place] [phone] [name]

## 2018-01-26 ENCOUNTER — Other Ambulatory Visit: Payer: Self-pay | Admitting: *Deleted

## 2018-01-26 NOTE — Patient Outreach (Signed)
[facility]) Care Management  01/26/2018  [name] 1941/10/16 [id number]   RN Health Coach Monthly Outreach  Referral Date: 02/23/2017 Referral Source: EMMI COPD program/Hospital admission Reason for Referral: EMMI COPD alert Insurance: Medicare   Outreach Attempt:   Outreach attempt #1 to patient for monthly follow up. No answer and unable to leave voicemail message due to voice mail not available.  Plan:   RN Health Coach will attempt another monthly outreach to patient in the month of February.  [place] [phone] [email or web address] [email or web address]

## 2018-01-28 ENCOUNTER — Other Ambulatory Visit: Payer: Self-pay | Admitting: Orthopedic Surgery

## 2018-01-28 DIAGNOSIS — M25552 Pain in left hip: Secondary | ICD-10-CM

## 2018-02-03 DIAGNOSIS — M25552 Pain in left hip: Secondary | ICD-10-CM | POA: Diagnosis not present

## 2018-02-04 ENCOUNTER — Other Ambulatory Visit: Payer: Medicare Other

## 2018-02-04 ENCOUNTER — Other Ambulatory Visit: Payer: Self-pay | Admitting: *Deleted

## 2018-02-04 NOTE — Patient Outreach (Signed)
[facility]) Care Management  02/04/2018  KLAIRE COURT 09/15/1941 [id number]   RN Health Coach Monthly Outreach  Referral Date: 02/23/2017 Referral Source: EMMI COPD program/Hospital admission Reason for Referral: EMMI COPD alert Insurance: Medicare   Outreach Attempt:  Outreach attempt #2 to patient for monthly follow up. No answer on home phone, unable to leave message due to voicemail not available.  Attempted cell phone and number states the wireless caller not available, unable to leave voice message.   Plan:  RN Health Coach will send unsuccessful outreach letter to patient.  RN Health Coach will make final telephone outreach attempt to patient within the next 10 business days.  [place] [phone] Abegail [email or web address]

## 2018-02-08 DIAGNOSIS — M25552 Pain in left hip: Secondary | ICD-10-CM | POA: Diagnosis not present

## 2018-02-17 ENCOUNTER — Other Ambulatory Visit: Payer: Self-pay | Admitting: *Deleted

## 2018-02-17 ENCOUNTER — Encounter: Payer: Self-pay | Admitting: *Deleted

## 2018-02-17 NOTE — Patient Outreach (Signed)
[facility]) Care Management  [facility] Care Manager  02/17/2018   [name] 1941-07-19 [id number]   RN Health Coach Monthly Outreach   Referral Date: 02/23/2017 Referral Source: EMMI COPD program/Hospital admission Reason for Referral: EMMI COPD alert Insurance: Medicare   Outreach Attempt:   Successful telephone outreach to patient for monthly follow up.  HIPAA verified with patient.  Patient verbalizing continued worsening pain in left hip.  States she recently was seen by orthopedic physician and she received a steroid injection in her lift hip. Continues to express pain since injection.  Awaiting results of her MRI and left hip xray.  Patient encouraged to contact orthopedic physician for a sooner follow up appointment.  Denies any recent falls.  States it is hard to walk and move around.  Using cane or walker at all times.  Patient states she has been receiving care giver support from her granddaughter that lives close by.  Denies any recent sick days and states she is using her rescue inhaler about twice a day.  Reports using her oxygen some throughout the days due to increasing hip pain with movement.  Encouraged patient to discuss increase oxygen needs with her health care providers.  States her abdominal incisions are completely healed from recently lap cholecystectomy and is tolerating solid foods.   Encounter Medications:  Outpatient Encounter Medications as of 02/17/2018  Medication Sig Note  . allopurinol (ZYLOPRIM) 100 MG tablet Take 100 mg by mouth daily.   Marland Kitchen aspirin EC 81 MG tablet Take 81 mg by mouth daily.   . citalopram (CELEXA) 20 MG tablet Take 20 mg by mouth 3 (three) times daily.    Marland Kitchen docusate sodium (COLACE) 100 MG capsule Take 100 mg by mouth daily.   . ferrous sulfate 325 (65 FE) MG tablet Take 325 mg by mouth daily with breakfast.   . furosemide (LASIX) 80 MG tablet Take 80 mg by mouth 2 (two) times daily.    . INCRUSE ELLIPTA 62.5 MCG/INH AEPB  Inhale 1 puff into the lungs daily.    . Ipratropium-Albuterol (COMBIVENT) 20-100 MCG/ACT AERS respimat Inhale 1 puff into the lungs every 6 (six) hours as needed for wheezing.   . isosorbide mononitrate (IMDUR) 120 MG 24 hr tablet Take 120 mg by mouth daily.   Marland Kitchen lisinopril (PRINIVIL,ZESTRIL) 20 MG tablet Take 20 mg by mouth daily.   . nitroGLYCERIN (NITROSTAT) 0.4 MG SL tablet Place 1 tablet (0.4 mg total) under the tongue every 5 (five) minutes as needed for chest pain. For chest pain   . ondansetron (ZOFRAN-ODT) 4 MG disintegrating tablet Take 1 tablet by mouth every 6 (six) hours as needed for nausea/vomiting.   . Oxycodone HCl 10 MG TABS Take 10 mg by mouth every 6 (six) hours as needed for pain.   . pilocarpine (PILOCAR) 1 % ophthalmic solution Place 1 drop into both eyes 2 (two) times daily.   . potassium chloride SA (K-DUR,KLOR-CON) 20 MEQ tablet Take 20 mEq by mouth daily.   . roflumilast (DALIRESP) 500 MCG TABS tablet Take 500 mcg by mouth at bedtime.    . traZODone (DESYREL) 50 MG tablet Take 150 mg by mouth at bedtime.    . colchicine 0.6 MG tablet Take 0.6 mg by mouth daily.   . cyclobenzaprine (FLEXERIL) 10 MG tablet Take 10 mg by mouth 3 (three) times daily as needed for muscle spasms.  02/17/2018: Patient stated she no longer takes  . omeprazole (PRILOSEC) 40 MG capsule Take  40 mg by mouth 2 (two) times daily.  02/17/2018: Reports not taking  . traMADol (ULTRAM) 50 MG tablet Take 1 tablet by mouth every 4 (four) hours as needed for pain. 02/17/2018: Reports not taking   No facility-administered encounter medications on file as of 02/17/2018.     Functional Status:  In your present state of health, do you have any difficulty performing the following activities: 08/27/2017 03/24/2017  Hearing? N N  Vision? N -  Difficulty concentrating or making decisions? N N  Walking or climbing stairs? Y Y  Dressing or bathing? N N  Doing errands, shopping? [place] and  eating ? N N  Using the Toilet? N N  In the past six months, have you accidently leaked urine? N N  Do you have problems with loss of bowel control? N N  Managing your Medications? N N  Comment - Complains about having too many meds.  Managing your Finances? N N  Housekeeping or managing your Housekeeping? N N  Comment - -  Some recent data might be hidden    Fall/Depression Screening: Fall Risk  12/07/2017 11/04/2017 10/12/2017  Falls in the past year? No No No  Comment - - -  Risk for fall due to : - - Impaired mobility   PHQ 2/9 Scores 11/04/2017 05/26/2017 03/24/2017 03/23/2017 03/13/2017 02/26/2017  PHQ - 2 Score 3 2 3 3 4 6   PHQ- 9 Score 6 5 10 10 15 18    THN CM Care Plan Problem One     Most Recent Value  Care Plan Problem One  Decrease in activitiy participation related to COPD and hip pain  Role Documenting the Problem One  Health [place] for Problem One  Active  THN Long Term Goal   Patient will continue to avoid hospitalization and emergency room visits in the next 90 days  THN Long Term Goal Start Date  12/07/17  Interventions for Problem One Long Term Goal  Reviewed COPD Action plan and when to call MD versus when to go to emergency room, encouraged patient to continue to follow up with physicians concerning increasing shortness of breath and need to use rescue inhaler 2 times a day, reviewed current care plan and goals with patient,  patient encouraged to keep and attend medical appointments, Falls precautions and prevention discussed with patient, encouraged patient to ask for assistance from her grandaughter when needed  [facility] CM Short Term Goal #1   Patient will call the orthopedic physician for earlier appointment for continued pain in the next 10 days.  THN CM Short Term Goal #1 Start Date  02/17/18  Interventions for Short Term Goal #1  Encouraged patient to discuss continued worsening left hip pain with orthopedic physician,  encouraged patient to increase activity  when available to increase goal of weight loss for better management of joint pain,  reviewed heat alternating with cold to assist with pain for patient,   THN CM Short Term Goal #2   Patient will report decrease in use of oxygen throught the day in the next 30 days.  THN CM Short Term Goal #2 Start Date  02/17/18  [facility] CM Short Term Goal #2 Met Date  02/17/18  Interventions for Short Term Goal #2  Encouraged patient to utilize oxygen as prescribed at nighttime, encouraged patient to notify her health care providers of the increase need for oxygen related to her hip pain, encouraged medication compliance,  Appointments:  Patient reports seeing Dr. [name] in January 2019 and scheduled follow up appointment is 04/18/2018.  Reports her follow up appointment with Orthopedist, Dr. French Ana 03/03/2018.  Plan:  RN Health Coach will make next monthly outreach to patient in the month of March.  RN Health Coach to send primary MD Quarterly Update.   [place] [phone] [email or web address] [email or web address]

## 2018-02-28 ENCOUNTER — Other Ambulatory Visit: Payer: Self-pay

## 2018-02-28 ENCOUNTER — Inpatient Hospital Stay (HOSPITAL_COMMUNITY)
Admission: EM | Admit: 2018-02-28 | Discharge: 2018-03-26 | DRG: 166 | Disposition: A | Discharge status: Skilled Nursing Facility | Payer: Medicare HMO | Attending: Internal Medicine | Admitting: Internal Medicine

## 2018-02-28 ENCOUNTER — Encounter (HOSPITAL_COMMUNITY): Payer: Self-pay | Admitting: *Deleted

## 2018-02-28 ENCOUNTER — Emergency Department (HOSPITAL_COMMUNITY): Payer: Medicare HMO

## 2018-02-28 DIAGNOSIS — I82412 Acute embolism and thrombosis of left femoral vein: Secondary | ICD-10-CM | POA: Diagnosis present

## 2018-02-28 DIAGNOSIS — R109 Unspecified abdominal pain: Secondary | ICD-10-CM | POA: Diagnosis not present

## 2018-02-28 DIAGNOSIS — S301XXA Contusion of abdominal wall, initial encounter: Secondary | ICD-10-CM | POA: Diagnosis not present

## 2018-02-28 DIAGNOSIS — K5520 Angiodysplasia of colon without hemorrhage: Secondary | ICD-10-CM | POA: Diagnosis present

## 2018-02-28 DIAGNOSIS — F329 Major depressive disorder, single episode, unspecified: Secondary | ICD-10-CM | POA: Diagnosis present

## 2018-02-28 DIAGNOSIS — R1084 Generalized abdominal pain: Secondary | ICD-10-CM | POA: Diagnosis not present

## 2018-02-28 DIAGNOSIS — I25119 Atherosclerotic heart disease of native coronary artery with unspecified angina pectoris: Secondary | ICD-10-CM | POA: Diagnosis present

## 2018-02-28 DIAGNOSIS — J9621 Acute and chronic respiratory failure with hypoxia: Secondary | ICD-10-CM | POA: Diagnosis present

## 2018-02-28 DIAGNOSIS — Z7982 Long term (current) use of aspirin: Secondary | ICD-10-CM

## 2018-02-28 DIAGNOSIS — R0600 Dyspnea, unspecified: Secondary | ICD-10-CM | POA: Diagnosis not present

## 2018-02-28 DIAGNOSIS — I2510 Atherosclerotic heart disease of native coronary artery without angina pectoris: Secondary | ICD-10-CM

## 2018-02-28 DIAGNOSIS — R933 Abnormal findings on diagnostic imaging of other parts of digestive tract: Secondary | ICD-10-CM | POA: Diagnosis not present

## 2018-02-28 DIAGNOSIS — E872 Acidosis: Secondary | ICD-10-CM | POA: Diagnosis present

## 2018-02-28 DIAGNOSIS — R188 Other ascites: Secondary | ICD-10-CM

## 2018-02-28 DIAGNOSIS — Z96612 Presence of left artificial shoulder joint: Secondary | ICD-10-CM | POA: Diagnosis present

## 2018-02-28 DIAGNOSIS — M1612 Unilateral primary osteoarthritis, left hip: Secondary | ICD-10-CM | POA: Diagnosis present

## 2018-02-28 DIAGNOSIS — Y92239 Unspecified place in hospital as the place of occurrence of the external cause: Secondary | ICD-10-CM | POA: Diagnosis present

## 2018-02-28 DIAGNOSIS — R58 Hemorrhage, not elsewhere classified: Secondary | ICD-10-CM | POA: Diagnosis not present

## 2018-02-28 DIAGNOSIS — Z96641 Presence of right artificial hip joint: Secondary | ICD-10-CM | POA: Diagnosis present

## 2018-02-28 DIAGNOSIS — R578 Other shock: Secondary | ICD-10-CM | POA: Diagnosis not present

## 2018-02-28 DIAGNOSIS — Z48812 Encounter for surgical aftercare following surgery on the circulatory system: Secondary | ICD-10-CM | POA: Diagnosis not present

## 2018-02-28 DIAGNOSIS — Z96653 Presence of artificial knee joint, bilateral: Secondary | ICD-10-CM | POA: Diagnosis present

## 2018-02-28 DIAGNOSIS — E669 Obesity, unspecified: Secondary | ICD-10-CM | POA: Diagnosis present

## 2018-02-28 DIAGNOSIS — I25810 Atherosclerosis of coronary artery bypass graft(s) without angina pectoris: Secondary | ICD-10-CM | POA: Diagnosis present

## 2018-02-28 DIAGNOSIS — Z880 Allergy status to penicillin: Secondary | ICD-10-CM

## 2018-02-28 DIAGNOSIS — E039 Hypothyroidism, unspecified: Secondary | ICD-10-CM | POA: Diagnosis present

## 2018-02-28 DIAGNOSIS — M6281 Muscle weakness (generalized): Secondary | ICD-10-CM | POA: Diagnosis not present

## 2018-02-28 DIAGNOSIS — D62 Acute posthemorrhagic anemia: Secondary | ICD-10-CM | POA: Diagnosis not present

## 2018-02-28 DIAGNOSIS — Z8249 Family history of ischemic heart disease and other diseases of the circulatory system: Secondary | ICD-10-CM

## 2018-02-28 DIAGNOSIS — I739 Peripheral vascular disease, unspecified: Secondary | ICD-10-CM | POA: Diagnosis present

## 2018-02-28 DIAGNOSIS — R0603 Acute respiratory distress: Secondary | ICD-10-CM | POA: Diagnosis not present

## 2018-02-28 DIAGNOSIS — D729 Disorder of white blood cells, unspecified: Secondary | ICD-10-CM | POA: Diagnosis not present

## 2018-02-28 DIAGNOSIS — I10 Essential (primary) hypertension: Secondary | ICD-10-CM | POA: Diagnosis not present

## 2018-02-28 DIAGNOSIS — R110 Nausea: Secondary | ICD-10-CM | POA: Diagnosis not present

## 2018-02-28 DIAGNOSIS — I110 Hypertensive heart disease with heart failure: Secondary | ICD-10-CM | POA: Diagnosis present

## 2018-02-28 DIAGNOSIS — K219 Gastro-esophageal reflux disease without esophagitis: Secondary | ICD-10-CM | POA: Diagnosis present

## 2018-02-28 DIAGNOSIS — R935 Abnormal findings on diagnostic imaging of other abdominal regions, including retroperitoneum: Secondary | ICD-10-CM | POA: Clinically undetermined

## 2018-02-28 DIAGNOSIS — D649 Anemia, unspecified: Secondary | ICD-10-CM | POA: Diagnosis not present

## 2018-02-28 DIAGNOSIS — J90 Pleural effusion, not elsewhere classified: Secondary | ICD-10-CM | POA: Diagnosis not present

## 2018-02-28 DIAGNOSIS — R488 Other symbolic dysfunctions: Secondary | ICD-10-CM | POA: Diagnosis not present

## 2018-02-28 DIAGNOSIS — R0602 Shortness of breath: Secondary | ICD-10-CM | POA: Diagnosis not present

## 2018-02-28 DIAGNOSIS — R112 Nausea with vomiting, unspecified: Secondary | ICD-10-CM | POA: Diagnosis not present

## 2018-02-28 DIAGNOSIS — Y838 Other surgical procedures as the cause of abnormal reaction of the patient, or of later complication, without mention of misadventure at the time of the procedure: Secondary | ICD-10-CM | POA: Diagnosis present

## 2018-02-28 DIAGNOSIS — G4730 Sleep apnea, unspecified: Secondary | ICD-10-CM | POA: Diagnosis present

## 2018-02-28 DIAGNOSIS — Z9981 Dependence on supplemental oxygen: Secondary | ICD-10-CM

## 2018-02-28 DIAGNOSIS — R000 Tachycardia, unspecified: Secondary | ICD-10-CM | POA: Diagnosis present

## 2018-02-28 DIAGNOSIS — Z888 Allergy status to other drugs, medicaments and biological substances status: Secondary | ICD-10-CM

## 2018-02-28 DIAGNOSIS — R1030 Lower abdominal pain, unspecified: Secondary | ICD-10-CM | POA: Diagnosis not present

## 2018-02-28 DIAGNOSIS — I2609 Other pulmonary embolism with acute cor pulmonale: Secondary | ICD-10-CM | POA: Diagnosis not present

## 2018-02-28 DIAGNOSIS — I5032 Chronic diastolic (congestive) heart failure: Secondary | ICD-10-CM | POA: Diagnosis present

## 2018-02-28 DIAGNOSIS — E860 Dehydration: Secondary | ICD-10-CM

## 2018-02-28 DIAGNOSIS — Z6832 Body mass index (BMI) 32.0-32.9, adult: Secondary | ICD-10-CM | POA: Diagnosis not present

## 2018-02-28 DIAGNOSIS — Z96611 Presence of right artificial shoulder joint: Secondary | ICD-10-CM | POA: Diagnosis present

## 2018-02-28 DIAGNOSIS — I2699 Other pulmonary embolism without acute cor pulmonale: Principal | ICD-10-CM | POA: Diagnosis present

## 2018-02-28 DIAGNOSIS — Z79899 Other long term (current) drug therapy: Secondary | ICD-10-CM

## 2018-02-28 DIAGNOSIS — G8929 Other chronic pain: Secondary | ICD-10-CM | POA: Diagnosis present

## 2018-02-28 DIAGNOSIS — T148XXA Other injury of unspecified body region, initial encounter: Secondary | ICD-10-CM | POA: Diagnosis not present

## 2018-02-28 DIAGNOSIS — Z9049 Acquired absence of other specified parts of digestive tract: Secondary | ICD-10-CM

## 2018-02-28 DIAGNOSIS — R52 Pain, unspecified: Secondary | ICD-10-CM

## 2018-02-28 DIAGNOSIS — K529 Noninfective gastroenteritis and colitis, unspecified: Secondary | ICD-10-CM | POA: Clinically undetermined

## 2018-02-28 DIAGNOSIS — K551 Chronic vascular disorders of intestine: Secondary | ICD-10-CM | POA: Diagnosis present

## 2018-02-28 DIAGNOSIS — J9601 Acute respiratory failure with hypoxia: Secondary | ICD-10-CM | POA: Diagnosis not present

## 2018-02-28 DIAGNOSIS — D638 Anemia in other chronic diseases classified elsewhere: Secondary | ICD-10-CM | POA: Diagnosis present

## 2018-02-28 DIAGNOSIS — Z87891 Personal history of nicotine dependence: Secondary | ICD-10-CM

## 2018-02-28 DIAGNOSIS — I2782 Chronic pulmonary embolism: Secondary | ICD-10-CM | POA: Diagnosis present

## 2018-02-28 DIAGNOSIS — Z79890 Hormone replacement therapy (postmenopausal): Secondary | ICD-10-CM

## 2018-02-28 DIAGNOSIS — J449 Chronic obstructive pulmonary disease, unspecified: Secondary | ICD-10-CM | POA: Diagnosis not present

## 2018-02-28 DIAGNOSIS — E876 Hypokalemia: Secondary | ICD-10-CM | POA: Diagnosis present

## 2018-02-28 DIAGNOSIS — Z86718 Personal history of other venous thrombosis and embolism: Secondary | ICD-10-CM

## 2018-02-28 DIAGNOSIS — R278 Other lack of coordination: Secondary | ICD-10-CM | POA: Diagnosis not present

## 2018-02-28 DIAGNOSIS — J80 Acute respiratory distress syndrome: Secondary | ICD-10-CM | POA: Diagnosis not present

## 2018-02-28 DIAGNOSIS — N179 Acute kidney failure, unspecified: Secondary | ICD-10-CM

## 2018-02-28 DIAGNOSIS — Z955 Presence of coronary angioplasty implant and graft: Secondary | ICD-10-CM

## 2018-02-28 DIAGNOSIS — K559 Vascular disorder of intestine, unspecified: Secondary | ICD-10-CM | POA: Diagnosis not present

## 2018-02-28 DIAGNOSIS — R339 Retention of urine, unspecified: Secondary | ICD-10-CM | POA: Diagnosis not present

## 2018-02-28 DIAGNOSIS — I700 Atherosclerosis of aorta: Secondary | ICD-10-CM | POA: Diagnosis not present

## 2018-02-28 DIAGNOSIS — E785 Hyperlipidemia, unspecified: Secondary | ICD-10-CM | POA: Diagnosis present

## 2018-02-28 HISTORY — DX: Dyspnea, unspecified: R06.00

## 2018-02-28 HISTORY — DX: Cardiac arrhythmia, unspecified: I49.9

## 2018-02-28 HISTORY — DX: Anxiety disorder, unspecified: F41.9

## 2018-02-28 HISTORY — DX: Pneumonia, unspecified organism: J18.9

## 2018-02-28 LAB — URINALYSIS, ROUTINE W REFLEX MICROSCOPIC
Bacteria, UA: NONE SEEN
Bilirubin Urine: NEGATIVE
Glucose, UA: NEGATIVE mg/dL
Ketones, ur: NEGATIVE mg/dL
Nitrite: NEGATIVE
Protein, ur: NEGATIVE mg/dL
Specific Gravity, Urine: 1.012 (ref 1.005–1.030)
pH: 5 (ref 5.0–8.0)

## 2018-02-28 LAB — COMPREHENSIVE METABOLIC PANEL
ALT: 10 U/L — ABNORMAL LOW (ref 14–54)
AST: 15 U/L (ref 15–41)
Albumin: 3.3 g/dL — ABNORMAL LOW (ref 3.5–5.0)
Alkaline Phosphatase: 85 U/L (ref 38–126)
Anion gap: 13 (ref 5–15)
BUN: 17 mg/dL (ref 6–20)
CO2: 28 mmol/L (ref 22–32)
Calcium: 9 mg/dL (ref 8.9–10.3)
Chloride: 98 mmol/L — ABNORMAL LOW (ref 101–111)
Creatinine, Ser: 1.35 mg/dL — ABNORMAL HIGH (ref 0.44–1.00)
GFR calc Af Amer: 43 mL/min — ABNORMAL LOW (ref >60)
GFR calc non Af Amer: 37 mL/min — ABNORMAL LOW (ref >60)
Glucose, Bld: 97 mg/dL (ref 65–99)
Potassium: 2.9 mmol/L — ABNORMAL LOW (ref 3.5–5.1)
Sodium: 139 mmol/L (ref 135–145)
Total Bilirubin: 0.3 mg/dL (ref 0.3–1.2)
Total Protein: 6.4 g/dL — ABNORMAL LOW (ref 6.5–8.1)

## 2018-02-28 LAB — CBC
HCT: 39.8 % (ref 36.0–46.0)
Hemoglobin: 12.7 g/dL (ref 12.0–15.0)
MCH: 28.1 pg (ref 26.0–34.0)
MCHC: 31.9 g/dL (ref 30.0–36.0)
MCV: 88.1 fL (ref 78.0–100.0)
Platelets: 248 10*3/uL (ref 150–400)
RBC: 4.52 MIL/uL (ref 3.87–5.11)
RDW: 14.8 % (ref 11.5–15.5)
WBC: 10.7 10*3/uL — ABNORMAL HIGH (ref 4.0–10.5)

## 2018-02-28 LAB — LIPASE, BLOOD: Lipase: 20 U/L (ref 11–51)

## 2018-02-28 MED ORDER — POTASSIUM CHLORIDE CRYS ER 20 MEQ PO TBCR
40.0000 meq | EXTENDED_RELEASE_TABLET | Freq: Once | ORAL | Status: AC
  Administered 2018-02-28: 40 meq via ORAL
  Filled 2018-02-28: qty 2

## 2018-02-28 MED ORDER — SODIUM CHLORIDE 0.9 % IV BOLUS (SEPSIS)
500.0000 mL | Freq: Once | INTRAVENOUS | Status: AC
  Administered 2018-03-01: 500 mL via INTRAVENOUS

## 2018-02-28 MED ORDER — IOPAMIDOL (ISOVUE-370) INJECTION 76%
INTRAVENOUS | Status: AC
  Filled 2018-02-28: qty 100

## 2018-02-28 MED ORDER — SODIUM CHLORIDE 0.9 % IV BOLUS (SEPSIS): 500.0000 mL | Freq: Once | INTRAVENOUS | Status: DC

## 2018-02-28 MED ORDER — ONDANSETRON HCL 4 MG/2ML IJ SOLN
4.0000 mg | Freq: Once | INTRAMUSCULAR | Status: AC
  Administered 2018-02-28: 4 mg via INTRAVENOUS
  Filled 2018-02-28: qty 2

## 2018-02-28 MED ORDER — SODIUM CHLORIDE 0.9 % IV BOLUS (SEPSIS)
1000.0000 mL | Freq: Once | INTRAVENOUS | Status: AC
  Administered 2018-02-28: 1000 mL via INTRAVENOUS

## 2018-02-28 NOTE — ED Triage Notes (Signed)
The pt is c/o nausea for 2 weeks  No pain no vomiting or diarrhea  No appetite

## 2018-02-28 NOTE — ED Notes (Signed)
Pt unable to void at this time. 

## 2018-02-28 NOTE — ED Notes (Addendum)
Pt O2 at 91%, this tech placed pt on 2L via nasal cannula. O2 went up to 95%.

## 2018-02-28 NOTE — ED Notes (Signed)
Patient's rhythm seems to switch from sinus arrthymia to afib.

## 2018-02-28 NOTE — ED Notes (Signed)
Orthostatics  Lying:  115/50  HR75 Sitting:  112/48 HR 75 Standing: 96/62 HR 81

## 2018-03-01 ENCOUNTER — Inpatient Hospital Stay (HOSPITAL_COMMUNITY): Payer: Medicare Other

## 2018-03-01 ENCOUNTER — Encounter (HOSPITAL_COMMUNITY): Payer: Self-pay

## 2018-03-01 ENCOUNTER — Other Ambulatory Visit: Payer: Self-pay | Admitting: *Deleted

## 2018-03-01 ENCOUNTER — Inpatient Hospital Stay (HOSPITAL_COMMUNITY): Payer: Medicare HMO

## 2018-03-01 DIAGNOSIS — D62 Acute posthemorrhagic anemia: Secondary | ICD-10-CM | POA: Diagnosis not present

## 2018-03-01 DIAGNOSIS — E785 Hyperlipidemia, unspecified: Secondary | ICD-10-CM | POA: Diagnosis present

## 2018-03-01 DIAGNOSIS — K551 Chronic vascular disorders of intestine: Secondary | ICD-10-CM | POA: Diagnosis present

## 2018-03-01 DIAGNOSIS — K5520 Angiodysplasia of colon without hemorrhage: Secondary | ICD-10-CM | POA: Diagnosis present

## 2018-03-01 DIAGNOSIS — E872 Acidosis: Secondary | ICD-10-CM | POA: Diagnosis present

## 2018-03-01 DIAGNOSIS — R58 Hemorrhage, not elsewhere classified: Secondary | ICD-10-CM | POA: Diagnosis not present

## 2018-03-01 DIAGNOSIS — Z96641 Presence of right artificial hip joint: Secondary | ICD-10-CM | POA: Diagnosis present

## 2018-03-01 DIAGNOSIS — J449 Chronic obstructive pulmonary disease, unspecified: Secondary | ICD-10-CM | POA: Diagnosis present

## 2018-03-01 DIAGNOSIS — Z96612 Presence of left artificial shoulder joint: Secondary | ICD-10-CM | POA: Diagnosis present

## 2018-03-01 DIAGNOSIS — R112 Nausea with vomiting, unspecified: Secondary | ICD-10-CM | POA: Diagnosis not present

## 2018-03-01 DIAGNOSIS — I25810 Atherosclerosis of coronary artery bypass graft(s) without angina pectoris: Secondary | ICD-10-CM | POA: Diagnosis present

## 2018-03-01 DIAGNOSIS — Y92239 Unspecified place in hospital as the place of occurrence of the external cause: Secondary | ICD-10-CM | POA: Diagnosis present

## 2018-03-01 DIAGNOSIS — R188 Other ascites: Secondary | ICD-10-CM | POA: Diagnosis present

## 2018-03-01 DIAGNOSIS — I2699 Other pulmonary embolism without acute cor pulmonale: Secondary | ICD-10-CM | POA: Diagnosis present

## 2018-03-01 DIAGNOSIS — R110 Nausea: Secondary | ICD-10-CM | POA: Diagnosis not present

## 2018-03-01 DIAGNOSIS — R0602 Shortness of breath: Secondary | ICD-10-CM | POA: Diagnosis not present

## 2018-03-01 DIAGNOSIS — K219 Gastro-esophageal reflux disease without esophagitis: Secondary | ICD-10-CM | POA: Diagnosis present

## 2018-03-01 DIAGNOSIS — I10 Essential (primary) hypertension: Secondary | ICD-10-CM | POA: Diagnosis not present

## 2018-03-01 DIAGNOSIS — I82412 Acute embolism and thrombosis of left femoral vein: Secondary | ICD-10-CM | POA: Diagnosis present

## 2018-03-01 DIAGNOSIS — R0600 Dyspnea, unspecified: Secondary | ICD-10-CM | POA: Diagnosis not present

## 2018-03-01 DIAGNOSIS — K529 Noninfective gastroenteritis and colitis, unspecified: Secondary | ICD-10-CM | POA: Diagnosis not present

## 2018-03-01 DIAGNOSIS — J9621 Acute and chronic respiratory failure with hypoxia: Secondary | ICD-10-CM | POA: Diagnosis present

## 2018-03-01 DIAGNOSIS — E669 Obesity, unspecified: Secondary | ICD-10-CM | POA: Diagnosis present

## 2018-03-01 DIAGNOSIS — Z6832 Body mass index (BMI) 32.0-32.9, adult: Secondary | ICD-10-CM | POA: Diagnosis not present

## 2018-03-01 DIAGNOSIS — Y838 Other surgical procedures as the cause of abnormal reaction of the patient, or of later complication, without mention of misadventure at the time of the procedure: Secondary | ICD-10-CM | POA: Diagnosis present

## 2018-03-01 DIAGNOSIS — T148XXA Other injury of unspecified body region, initial encounter: Secondary | ICD-10-CM | POA: Diagnosis not present

## 2018-03-01 DIAGNOSIS — G4730 Sleep apnea, unspecified: Secondary | ICD-10-CM | POA: Diagnosis present

## 2018-03-01 DIAGNOSIS — J9601 Acute respiratory failure with hypoxia: Secondary | ICD-10-CM | POA: Diagnosis not present

## 2018-03-01 DIAGNOSIS — E876 Hypokalemia: Secondary | ICD-10-CM | POA: Diagnosis not present

## 2018-03-01 DIAGNOSIS — I110 Hypertensive heart disease with heart failure: Secondary | ICD-10-CM | POA: Diagnosis present

## 2018-03-01 DIAGNOSIS — I2609 Other pulmonary embolism with acute cor pulmonale: Secondary | ICD-10-CM | POA: Diagnosis not present

## 2018-03-01 DIAGNOSIS — R935 Abnormal findings on diagnostic imaging of other abdominal regions, including retroperitoneum: Secondary | ICD-10-CM | POA: Diagnosis not present

## 2018-03-01 DIAGNOSIS — N179 Acute kidney failure, unspecified: Secondary | ICD-10-CM | POA: Diagnosis not present

## 2018-03-01 DIAGNOSIS — I2510 Atherosclerotic heart disease of native coronary artery without angina pectoris: Secondary | ICD-10-CM | POA: Diagnosis not present

## 2018-03-01 DIAGNOSIS — E860 Dehydration: Secondary | ICD-10-CM | POA: Diagnosis present

## 2018-03-01 DIAGNOSIS — R0603 Acute respiratory distress: Secondary | ICD-10-CM | POA: Diagnosis not present

## 2018-03-01 DIAGNOSIS — I5032 Chronic diastolic (congestive) heart failure: Secondary | ICD-10-CM | POA: Diagnosis present

## 2018-03-01 DIAGNOSIS — Z96611 Presence of right artificial shoulder joint: Secondary | ICD-10-CM | POA: Diagnosis present

## 2018-03-01 DIAGNOSIS — F329 Major depressive disorder, single episode, unspecified: Secondary | ICD-10-CM | POA: Diagnosis present

## 2018-03-01 DIAGNOSIS — E039 Hypothyroidism, unspecified: Secondary | ICD-10-CM | POA: Diagnosis not present

## 2018-03-01 DIAGNOSIS — R578 Other shock: Secondary | ICD-10-CM | POA: Diagnosis not present

## 2018-03-01 DIAGNOSIS — R52 Pain, unspecified: Secondary | ICD-10-CM | POA: Diagnosis not present

## 2018-03-01 DIAGNOSIS — Z96653 Presence of artificial knee joint, bilateral: Secondary | ICD-10-CM | POA: Diagnosis present

## 2018-03-01 DIAGNOSIS — D649 Anemia, unspecified: Secondary | ICD-10-CM | POA: Diagnosis not present

## 2018-03-01 LAB — BASIC METABOLIC PANEL
Anion gap: 8 (ref 5–15)
BUN: 11 mg/dL (ref 6–20)
CO2: 30 mmol/L (ref 22–32)
Calcium: 8.6 mg/dL — ABNORMAL LOW (ref 8.9–10.3)
Chloride: 103 mmol/L (ref 101–111)
Creatinine, Ser: 0.89 mg/dL (ref 0.44–1.00)
GFR calc Af Amer: >60 mL/min (ref >60)
GFR calc non Af Amer: >60 mL/min (ref >60)
Glucose, Bld: 106 mg/dL — ABNORMAL HIGH (ref 65–99)
Potassium: 3 mmol/L — ABNORMAL LOW (ref 3.5–5.1)
Sodium: 141 mmol/L (ref 135–145)

## 2018-03-01 LAB — HEPARIN LEVEL (UNFRACTIONATED)
Heparin Unfractionated: 0.7 IU/mL (ref 0.30–0.70)
Heparin Unfractionated: 0.8 IU/mL — ABNORMAL HIGH (ref 0.30–0.70)

## 2018-03-01 LAB — MAGNESIUM: Magnesium: 1.5 mg/dL — ABNORMAL LOW (ref 1.7–2.4)

## 2018-03-01 LAB — TROPONIN I: Troponin I: <0.03 ng/mL (ref <0.03)

## 2018-03-01 MED ORDER — ENSURE ENLIVE PO LIQD: 237.0000 mL | Freq: Two times a day (BID) | ORAL | Status: DC

## 2018-03-01 MED ORDER — POTASSIUM CHLORIDE CRYS ER 20 MEQ PO TBCR
40.0000 meq | EXTENDED_RELEASE_TABLET | Freq: Two times a day (BID) | ORAL | Status: AC
  Administered 2018-03-01 (×2): 40 meq via ORAL
  Filled 2018-03-01 (×2): qty 2

## 2018-03-01 MED ORDER — UMECLIDINIUM BROMIDE 62.5 MCG/INH IN AEPB
1.0000 | INHALATION_SPRAY | Freq: Every day | RESPIRATORY_TRACT | Status: DC
  Administered 2018-03-01 – 2018-03-13 (×13): 1 via RESPIRATORY_TRACT
  Filled 2018-03-01 (×2): qty 7

## 2018-03-01 MED ORDER — TRAZODONE HCL 150 MG PO TABS
150.0000 mg | ORAL_TABLET | Freq: Every day | ORAL | Status: DC
  Administered 2018-03-01 – 2018-03-12 (×10): 150 mg via ORAL
  Filled 2018-03-01 (×11): qty 1

## 2018-03-01 MED ORDER — PANTOPRAZOLE SODIUM 40 MG PO TBEC
80.0000 mg | DELAYED_RELEASE_TABLET | Freq: Every day | ORAL | Status: DC
  Administered 2018-03-01 – 2018-03-12 (×11): 80 mg via ORAL
  Filled 2018-03-01 (×12): qty 2

## 2018-03-01 MED ORDER — PRAMIPEXOLE DIHYDROCHLORIDE 0.25 MG PO TABS
0.2500 mg | ORAL_TABLET | Freq: Every day | ORAL | Status: DC
  Administered 2018-03-01 – 2018-03-25 (×22): 0.25 mg via ORAL
  Filled 2018-03-01 (×26): qty 1

## 2018-03-01 MED ORDER — IPRATROPIUM-ALBUTEROL 0.5-2.5 (3) MG/3ML IN SOLN
3.0000 mL | Freq: Four times a day (QID) | RESPIRATORY_TRACT | Status: DC | PRN
  Administered 2018-03-13: 3 mL via RESPIRATORY_TRACT
  Filled 2018-03-01: qty 3

## 2018-03-01 MED ORDER — ALLOPURINOL 100 MG PO TABS
100.0000 mg | ORAL_TABLET | Freq: Every day | ORAL | Status: DC
  Administered 2018-03-01 – 2018-03-13 (×12): 100 mg via ORAL
  Filled 2018-03-01 (×14): qty 1

## 2018-03-01 MED ORDER — BOOST / RESOURCE BREEZE PO LIQD CUSTOM
1.0000 | Freq: Two times a day (BID) | ORAL | Status: DC
  Administered 2018-03-01 – 2018-03-02 (×3): 1 via ORAL

## 2018-03-01 MED ORDER — CITALOPRAM HYDROBROMIDE 20 MG PO TABS
20.0000 mg | ORAL_TABLET | Freq: Three times a day (TID) | ORAL | Status: DC
  Administered 2018-03-01 – 2018-03-05 (×13): 20 mg via ORAL
  Filled 2018-03-01 (×13): qty 1

## 2018-03-01 MED ORDER — VITAMIN D 1000 UNITS PO TABS
1000.0000 [IU] | ORAL_TABLET | Freq: Every day | ORAL | Status: DC
  Administered 2018-03-01 – 2018-03-26 (×23): 1000 [IU] via ORAL
  Filled 2018-03-01 (×25): qty 1

## 2018-03-01 MED ORDER — HEPARIN BOLUS VIA INFUSION
4600.0000 [IU] | Freq: Once | INTRAVENOUS | Status: AC
  Administered 2018-03-01: 4600 [IU] via INTRAVENOUS
  Filled 2018-03-01: qty 4600

## 2018-03-01 MED ORDER — OXYCODONE HCL 5 MG PO TABS
10.0000 mg | ORAL_TABLET | Freq: Four times a day (QID) | ORAL | Status: DC | PRN
  Administered 2018-03-01 – 2018-03-08 (×22): 10 mg via ORAL
  Filled 2018-03-01 (×22): qty 2

## 2018-03-01 MED ORDER — LIDOCAINE 5 % EX PTCH
1.0000 | MEDICATED_PATCH | CUTANEOUS | Status: DC
  Administered 2018-03-01 – 2018-03-26 (×23): 1 via TRANSDERMAL
  Filled 2018-03-01 (×26): qty 1

## 2018-03-01 MED ORDER — ACETAMINOPHEN 650 MG RE SUPP: 650.0000 mg | Freq: Four times a day (QID) | RECTAL | Status: DC | PRN

## 2018-03-01 MED ORDER — ONDANSETRON HCL 4 MG/2ML IJ SOLN
4.0000 mg | Freq: Four times a day (QID) | INTRAMUSCULAR | Status: DC | PRN
  Administered 2018-03-01 – 2018-03-12 (×18): 4 mg via INTRAVENOUS
  Filled 2018-03-01 (×17): qty 2

## 2018-03-01 MED ORDER — FERROUS SULFATE 325 (65 FE) MG PO TABS
325.0000 mg | ORAL_TABLET | Freq: Every day | ORAL | Status: DC
  Administered 2018-03-01 – 2018-03-26 (×20): 325 mg via ORAL
  Filled 2018-03-01 (×24): qty 1

## 2018-03-01 MED ORDER — POTASSIUM CHLORIDE CRYS ER 20 MEQ PO TBCR
20.0000 meq | EXTENDED_RELEASE_TABLET | Freq: Every day | ORAL | Status: DC
  Administered 2018-03-01 – 2018-03-03 (×3): 20 meq via ORAL
  Filled 2018-03-01 (×4): qty 1

## 2018-03-01 MED ORDER — UMECLIDINIUM BROMIDE 62.5 MCG/INH IN AEPB: 1.0000 | INHALATION_SPRAY | Freq: Two times a day (BID) | RESPIRATORY_TRACT | Status: DC

## 2018-03-01 MED ORDER — PILOCARPINE HCL 1 % OP SOLN
1.0000 [drp] | Freq: Two times a day (BID) | OPHTHALMIC | Status: DC
  Administered 2018-03-01 – 2018-03-26 (×49): 1 [drp] via OPHTHALMIC
  Filled 2018-03-01 (×4): qty 15

## 2018-03-01 MED ORDER — ROFLUMILAST 500 MCG PO TABS
500.0000 ug | ORAL_TABLET | Freq: Every day | ORAL | Status: DC
  Administered 2018-03-01 – 2018-03-13 (×11): 500 ug via ORAL
  Filled 2018-03-01 (×15): qty 1

## 2018-03-01 MED ORDER — ISOSORBIDE MONONITRATE ER 30 MG PO TB24: 120.0000 mg | ORAL_TABLET | Freq: Every day | ORAL | Status: DC

## 2018-03-01 MED ORDER — HEPARIN (PORCINE) IN NACL 100-0.45 UNIT/ML-% IJ SOLN
1050.0000 [IU]/h | INTRAMUSCULAR | Status: DC
  Administered 2018-03-01 (×2): 1250 [IU]/h via INTRAVENOUS
  Administered 2018-03-02: 1000 [IU]/h via INTRAVENOUS
  Administered 2018-03-03 – 2018-03-04 (×2): 1050 [IU]/h via INTRAVENOUS
  Filled 2018-03-01 (×5): qty 250

## 2018-03-01 MED ORDER — ACETAMINOPHEN 325 MG PO TABS
650.0000 mg | ORAL_TABLET | Freq: Four times a day (QID) | ORAL | Status: DC | PRN
  Administered 2018-03-01 – 2018-03-22 (×9): 650 mg via ORAL
  Filled 2018-03-01 (×7): qty 2

## 2018-03-01 MED ORDER — ONDANSETRON HCL 4 MG PO TABS
4.0000 mg | ORAL_TABLET | Freq: Four times a day (QID) | ORAL | Status: DC | PRN
  Administered 2018-03-08: 4 mg via ORAL
  Filled 2018-03-01: qty 1

## 2018-03-01 NOTE — Consult Note (Addendum)
Hospital Consult    Reason for Consult:  SMA stenosis Requesting Physician:  Dr. Maryland Pink MRN #:  [id number]  History of Present Illness: This is a 77 y.o. female who presented to the emergency room with complaints of left hip pain, nausea, weakness, and dyspnea on exertion diagnosed with acute respiratory failure with hypoxia.  Workup included a CT scan which demonstrated multiple subacute pulmonary emboli of left lower lung lobe as well as R heart strain.  CT abdomen pelvis with contrast also demonstrated severe narrowing of proximal SMA.  Due to her nausea and generalized weakness she has not had much to eat over the past 2-3 weeks and has lost 6 pounds.  She however denies postprandial pain.  She denies any changes in her bowel habits.    Workup also included venous duplex of left leg which demonstrates acute DVT extending to the proximal femoral vein.  Pain in L hip has been progressively getting worse.  She denies any impressive edema of left leg versus right and denies any calf pain.  Per patient she has been recommended left hip replacement.  She admittedly has not walked much over the past month.  Past medical history includes prior DVTs for which she was placed on Xarelto.  This however was discontinued last year due to a GI bleed.  She is unaware of any hypercoagulable conditions or trauma.  She denies tobacco use.  She does take an aspirin daily.  Past Medical History:  Diagnosis Date  . Anemia    bld. transfusion post lumbar surgery- 2012  . Anxiety   . Arthralgia    NOS  . Blood transfusion 2011   WITH BACK SURGERY  . CAD (coronary artery disease)    s/p CABG 2004; s/p DES to LM in 2010;  [place] [facility] 10/29/11: EF 50-55%, mild elevated filling pressures, no pulmonary HTN, LM 90% ISR, LAD and CFX occluded, S-RI occluded (old), S-OM3 ok and L-LAD ok, native nondominant RCA 95% -  med rx recommended ; Lexiscan Myoview 7/13 at [facility]: demonstrated "normal LV function, anterior  attenuation and localized ischemia, inferior, basilar, mid section"  . Carotid artery occlusion    moderate  . Chronic diastolic heart failure (HCC)    Echo 9/10: EF 89-38%, grade 1 diastolic dysfunction  . COPD (chronic obstructive pulmonary disease) ([place])    Emphysema dxed by Dr. Woody Seller in [place] based on PFTs per pt in 2006; placed on albuterol  . Depression    TAKES CELEXA  AND  (OFF- WELBUTRIN)  . DVT of lower extremity (deep venous thrombosis) (HCC)    recurrent. bilateral (2 episodes)  . Dyspnea   . Dysrhythmia    afib with cabg  . Exertional angina (HCC)    Treated with Isosorbide, Ranexa, amlodipine; intolerant to metoprolol  . GERD (gastroesophageal reflux disease)   . HLD (hyperlipidemia)   . Hypertension   . Hyperthyroidism   . Obesity (BMI 30-39.9) 2009   BMI 33  . Osteoarthritis   . Pneumonia   . PVD (peripheral vascular disease) ([place])   . Sleep apnea 2012   USED CPAP THEN  STARTED USING SPIRIVA , Nov. 2013- last evaluation , changed from mask to aparatus that is just for her nose.Marland Kitchen     Past Surgical History:  Procedure Laterality Date  . ANKLE SURGERY  2004  . [place]   lower; another scheduled, opt. reports 4 back- lumbar, 3 cerv. fusions  for later 2009  . BREAST ENHANCEMENT SURGERY    .  CARDIAC CATHETERIZATION  11/2009   Patent LIMA to LAD and patent SVG to OM1. Occluded SVG to ramus and diagonal. Left main: 90% ostial stenosis, LCX 60-70% proximal stenosis  . COLONOSCOPY WITH PROPOFOL N/A 02/17/2017   Procedure: COLONOSCOPY WITH PROPOFOL;  Surgeon: Jerene Bears, MD;  Location: [facility] ENDOSCOPY;  Service: Endoscopy;  Laterality: N/A;  . CORONARY ANGIOPLASTY WITH STENT PLACEMENT  11/2009   Drug eluting stent to left main artery: 4.0 X 12 mm Ion   . CORONARY ARTERY BYPASS GRAFT  2004  . ESOPHAGOGASTRODUODENOSCOPY N/A 02/14/2017   Procedure: ESOPHAGOGASTRODUODENOSCOPY (EGD);  Surgeon: Doran Stabler, MD;  Location: [facility] ENDOSCOPY;  Service: Endoscopy;   Laterality: N/A;  . EYE SURGERY  2006   BILATERAL CAT EXT.   Marland Kitchen GIVENS CAPSULE STUDY N/A 02/15/2017   Procedure: GIVENS CAPSULE STUDY;  Surgeon: Doran Stabler, MD;  Location: [place];  Service: Endoscopy;  Laterality: N/A;  . GROIN MASS OPEN BIOPSY  2004  . JOINT REPLACEMENT  2012    BILATERAL KNEES  . JOINT REPLACEMENT  2010    RIGHT HIP REPLACEMENT  . NECK SURGERY  12/08   Due for repeat neck surgery  . REVERSE SHOULDER ARTHROPLASTY  02/26/2012   Procedure: REVERSE SHOULDER ARTHROPLASTY;  Surgeon: Marin Shutter, MD;  Location: [place];  Service: Orthopedics;  Laterality: Right;  RIGHT SHOULDER REVERSED ARTHROPLASTY  . SHOULDER ARTHROSCOPY WITH SUBACROMIAL DECOMPRESSION Left 02/10/2013   Procedure: SHOULDER ARTHROSCOPY WITH SUBACROMIAL DECOMPRESSION DISTAL CLAVICLE RESECTION;  Surgeon: Marin Shutter, MD;  Location: [facility];  Service: Orthopedics;  Laterality: Left;  DISTAL CLAVICLE RESECTION  . TOTAL HIP ARTHROPLASTY  2007   Right    Allergies  Allergen Reactions  . Zolpidem Tartrate Anxiety    CONFUSION   . Pitavastatin Other (See Comments)    Not sure  . Ropinirole Nausea Only    Makes legs jump  . Penicillins Rash    Prior to Admission medications   Medication Sig Start Date End Date Taking? Authorizing Provider  allopurinol (ZYLOPRIM) 100 MG tablet Take 100 mg by mouth daily. 01/30/17  Yes [provider]  aspirin EC 81 MG tablet Take 81 mg by mouth daily.   Yes [provider]  citalopram (CELEXA) 20 MG tablet Take 20 mg by mouth 3 (three) times daily.    Yes [provider]  [employer] D3 1000 units capsule Take 1,000 Units by mouth daily. 01/14/18  Yes [provider]  ferrous sulfate 325 (65 FE) MG tablet Take 325 mg by mouth daily with breakfast.   Yes [provider]  INCRUSE ELLIPTA 62.5 MCG/INH AEPB Inhale 1 puff into the lungs 2 (two) times daily.  11/30/16  Yes [provider]  Ipratropium-Albuterol (COMBIVENT) 20-100  MCG/ACT AERS respimat Inhale 1 puff into the lungs every 6 (six) hours as needed for wheezing.   Yes [provider]  isosorbide mononitrate (IMDUR) 120 MG 24 hr tablet Take 120 mg by mouth daily.   Yes [provider]  nitroGLYCERIN (NITROSTAT) 0.4 MG SL tablet Place 1 tablet (0.4 mg total) under the tongue every 5 (five) minutes as needed for chest pain. For chest pain 04/24/16  Yes Fay Records, MD  omeprazole (PRILOSEC) 40 MG capsule Take 40 mg by mouth 2 (two) times daily.    Yes [provider]  Oxycodone HCl 10 MG TABS Take 10 mg by mouth every 6 (six) hours as needed for pain. 01/25/17  Yes [provider]  pilocarpine (PILOCAR) 1 % ophthalmic solution Place 1 drop into both eyes 2 (two) times daily. 11/30/16  Yes [provider]  potassium chloride SA (K-DUR,KLOR-CON) 20 MEQ tablet Take 20 mEq by mouth daily.   Yes [provider]  pramipexole (MIRAPEX) 0.25 MG tablet Take 0.25 mg by mouth at bedtime. 01/14/18  Yes [provider]  roflumilast (DALIRESP) 500 MCG TABS tablet Take 500 mcg by mouth daily.    Yes [provider]  traMADol (ULTRAM) 50 MG tablet Take 150 tablets by mouth at bedtime.  11/08/17  Yes [provider]  traZODone (DESYREL) 50 MG tablet Take 150 mg by mouth at bedtime.    Yes [provider]    Social History   Socioeconomic History  . Marital status: Married    Spouse name: Not on file  . Number of children: 3  . Years of education: Not on file  . Highest education level: Not on file  Social Needs  . Financial resource strain: Not on file  . Food insecurity - worry: Not on file  . Food insecurity - inability: Not on file  . Transportation needs - medical: Not on file  . Transportation needs - non-medical: Not on file  Occupational History  . Occupation: retired    Comment: Electronics engineer: RETIRED  Tobacco Use  . Smoking status: Former Smoker    Years: 45.00      Last attempt to quit: 12/22/2002    Years since quitting: 15.2  . Smokeless tobacco: Never Used  Substance and Sexual Activity  . Alcohol use: No    Alcohol/week: 0.0 oz  . Drug use: No  . Sexual activity: No  Other Topics Concern  . Not on file  Social History Narrative   Married 72 years, lives with husband.   3 grown children.    Family History  Problem Relation Age of Onset  . Ovarian cancer Mother   . Cancer Mother   . Lung cancer Father   . COPD Father   . Heart disease Father   . Cancer Unknown   . Heart disease Unknown   . Colitis Unknown   . Heart disease Sister   . Cancer Sister     ROS: Otherwise negative unless mentioned in HPI  Physical Examination  Vitals:   03/01/18 0748 03/01/18 1508  BP: 115/72   Pulse: 70   Resp:    Temp: (!) 97.3 F (36.3 C)   SpO2: 98% 93%   Body mass index is 26.08 kg/m.  General:  WDWN in NAD Gait: Not observed HENT: WNL, normocephalic Pulmonary: normal non-labored breathing, without Rales, rhonchi,  wheezing Cardiac: regular, without  Murmurs, rubs or gallops Abdomen: soft, NT/ND, no masses Skin: without rashes Vascular Exam/Pulses: palpable and symmetrical radial and 2+ femoral and DP pulses Extremities: without ischemic changes, without Gangrene , without cellulitis; without open wounds; no impressive edema left versus right lower extremity; no R calf pain to palpation Musculoskeletal: no muscle wasting or atrophy  Neurologic: A&O X 3;  No focal weakness or paresthesias are detected; speech is fluent/normal Psychiatric:  The pt has Normal affect. Lymph:  Unremarkable  CBC    Component Value Date/Time   WBC 10.7 (H) 02/28/2018 1924   RBC 4.52 02/28/2018 1924   HGB 12.7 02/28/2018 1924   HGB 12.9 08/17/2017 1038   HCT 39.8 02/28/2018 1924   HCT 38.2 08/17/2017 1038   PLT 248 02/28/2018 1924  PLT 237 08/17/2017 1038   MCV 88.1 02/28/2018 1924   MCV 88 08/17/2017 1038   MCH 28.1 02/28/2018 1924   MCHC  31.9 02/28/2018 1924   RDW 14.8 02/28/2018 1924   RDW 15.7 (H) 08/17/2017 1038   LYMPHSABS 1.9 04/26/2014 1237   MONOABS 0.8 04/26/2014 1237   EOSABS 0.2 04/26/2014 1237   BASOSABS 0.1 04/26/2014 1237    BMET    Component Value Date/Time   NA 141 03/01/2018 1204   NA 144 09/14/2017 0921   K 3.0 (L) 03/01/2018 1204   CL 103 03/01/2018 1204   CO2 30 03/01/2018 1204   GLUCOSE 106 (H) 03/01/2018 1204   BUN 11 03/01/2018 1204   BUN 12 09/14/2017 0921   CREATININE 0.89 03/01/2018 1204   CALCIUM 8.6 (L) 03/01/2018 1204   GFRNONAA >60 03/01/2018 1204   GFRAA >60 03/01/2018 1204    COAGS: Lab Results  Component Value Date   INR 1.16 02/13/2017   INR 1.45 04/26/2014   INR 0.97 02/10/2013     Non-Invasive Vascular Imaging:   CT Angio Chest PE W/Cm &/Or Wo Cm (Accession [id number]) (Order [id number])  Imaging  Date: 02/28/2018 Department: [facility] Released By/Authorizing: Aaron Edelman (auto-released)  Exam Information   Status Exam Begun  Exam Ended   Final [99] 02/28/2018 11:49 PM 03/01/2018 12:22 AM  PACS Images   Show images for CT Angio Chest PE W/Cm &/Or Wo Cm  Study Result   CLINICAL DATA:  Subacute onset of nausea. Generalized abdominal pain. Mild generalized chest pain and shortness of breath.  EXAM: CT ANGIOGRAPHY CHEST  CT ABDOMEN AND PELVIS WITH CONTRAST  TECHNIQUE: Multidetector CT imaging of the chest was performed using the standard protocol during bolus administration of intravenous contrast. Multiplanar CT image reconstructions and MIPs were obtained to evaluate the vascular anatomy. Multidetector CT imaging of the abdomen and pelvis was performed using the standard protocol during bolus administration of intravenous contrast.  CONTRAST:  80 mL of Isovue 370 IV contrast  COMPARISON:  CT of the abdomen and pelvis performed 11/08/2017, and CT of the thoracic spine performed 07/26/2014  FINDINGS: CTA  CHEST FINDINGS  Cardiovascular: There appears to be subacute or chronic pulmonary embolus within the pulmonary artery to the left lower lobe, extending minimally into segmental branches. No additional pulmonary embolus is identified. The RV/LV ratio of 1.1 may reflect right heart strain.  Diffuse coronary artery calcifications are seen. Diffuse calcification is noted along the aortic arch and proximal great vessels. The heart remains normal in size. The patient is status post median sternotomy. There is a chronic fracture of the inferior-most sternal wire.  Mediastinum/Nodes: The mediastinum is otherwise unremarkable in appearance. No mediastinal lymphadenopathy is seen. No pericardial effusion is identified. The thyroid gland is grossly unremarkable. No axillary lymphadenopathy is seen.  Lungs/Pleura: Minimal bilateral atelectasis is noted. The lungs are otherwise clear. Mild scarring is noted at the lung apices. No significant focal consolidation, pleural effusion or pneumothorax is seen. No masses are identified.  Musculoskeletal: No acute osseous abnormalities are identified. The patient's right shoulder arthroplasty is incompletely imaged but appears grossly unremarkable. The visualized musculature is unremarkable in appearance. Diffuse calcification is noted about the patient's bilateral breast implants.  Review of the MIP images confirms the above findings.  CT ABDOMEN and PELVIS FINDINGS  Hepatobiliary: The liver is unremarkable in appearance. The patient is status post cholecystectomy, with clips noted at the gallbladder fossa. There is corresponding  distention of the intrahepatic biliary ducts and common bile duct.  Pancreas: The pancreas is atrophic and grossly unremarkable in appearance.  Spleen: The spleen is unremarkable in appearance.  Adrenals/Urinary Tract: Minimal calcification is noted at the left adrenal gland, likely adjacent to a small  underlying adrenal lesion. The right adrenal gland is unremarkable.  A right renal cyst is again noted, better characterized on the prior study. The kidneys are otherwise unremarkable. There is no evidence of hydronephrosis. No perinephric stranding is seen.  Stomach/Bowel: The stomach is unremarkable in appearance. The small bowel is within normal limits. The appendix is normal in caliber, without evidence of appendicitis. The colon is unremarkable in appearance.  Vascular/Lymphatic: Diffuse calcification is seen along the abdominal aorta and its branches, including along the right renal artery and superior mesenteric artery. There is likely relatively severe luminal narrowing at the proximal superior mesenteric artery. The abdominal aorta is otherwise grossly unremarkable. The inferior vena cava is grossly unremarkable. No retroperitoneal lymphadenopathy is seen. No pelvic sidewall lymphadenopathy is identified.  Reproductive: The bladder is mildly distended and grossly unremarkable. The uterus is unremarkable in appearance. The ovaries are grossly symmetric. No suspicious adnexal masses are seen.  Other: No additional soft tissue abnormalities are seen.  Musculoskeletal: No acute osseous abnormalities are identified. The patient's right hip arthroplasty is incompletely imaged, but appears grossly unremarkable. The patient is status post lumbar spinal fusion at L1-L3, with underlying decompression from L1-L4. The visualized musculature is unremarkable in appearance.  Review of the MIP images confirms the above findings.  IMPRESSION: 1. Apparent subacute or chronic pulmonary embolus within the pulmonary artery to the left lower lung lobe, extending minimally into segmental branches. RV/LV ratio of 1.1 may reflect right heart strain. Would correlate with the patient's symptoms. 2. Minimal bilateral atelectasis noted. Mild scarring at the lung apices. 3. Diffuse  coronary artery calcifications seen. 4. Diffuse aortic atherosclerosis, with diffuse calcification along the right renal artery and superior mesenteric artery. Likely relatively severe luminal narrowing at the proximal superior mesenteric artery. Would correlate for any associated symptoms.  Critical Value/emergent results were called by telephone at the time of interpretation on 03/01/2018 at 1:00 am to the ER clinical team, who verbally acknowledged these results.   Electronically Signed   By: Garald Balding M.D.   On: 03/01/2018 01:03    CT Abdomen Pelvis W Contrast (Accession [id number]) (Order [id number])  Imaging  Date: 02/28/2018 Department: [facility] Released By/Authorizing: Aaron Edelman (auto-released)  Exam Information   Status Exam Begun  Exam Ended   Final [99] 02/28/2018 11:49 PM 03/01/2018 12:22 AM  PACS Images   Show images for CT Abdomen Pelvis W Contrast  Study Result   CLINICAL DATA:  Subacute onset of nausea. Generalized abdominal pain. Mild generalized chest pain and shortness of breath.  EXAM: CT ANGIOGRAPHY CHEST  CT ABDOMEN AND PELVIS WITH CONTRAST  TECHNIQUE: Multidetector CT imaging of the chest was performed using the standard protocol during bolus administration of intravenous contrast. Multiplanar CT image reconstructions and MIPs were obtained to evaluate the vascular anatomy. Multidetector CT imaging of the abdomen and pelvis was performed using the standard protocol during bolus administration of intravenous contrast.  CONTRAST:  80 mL of Isovue 370 IV contrast  COMPARISON:  CT of the abdomen and pelvis performed 11/08/2017, and CT of the thoracic spine performed 07/26/2014  FINDINGS: CTA CHEST FINDINGS  Cardiovascular: There appears to be subacute or chronic pulmonary embolus within the  pulmonary artery to the left lower lobe, extending minimally into segmental branches. No additional  pulmonary embolus is identified. The RV/LV ratio of 1.1 may reflect right heart strain.  Diffuse coronary artery calcifications are seen. Diffuse calcification is noted along the aortic arch and proximal great vessels. The heart remains normal in size. The patient is status post median sternotomy. There is a chronic fracture of the inferior-most sternal wire.  Mediastinum/Nodes: The mediastinum is otherwise unremarkable in appearance. No mediastinal lymphadenopathy is seen. No pericardial effusion is identified. The thyroid gland is grossly unremarkable. No axillary lymphadenopathy is seen.  Lungs/Pleura: Minimal bilateral atelectasis is noted. The lungs are otherwise clear. Mild scarring is noted at the lung apices. No significant focal consolidation, pleural effusion or pneumothorax is seen. No masses are identified.  Musculoskeletal: No acute osseous abnormalities are identified. The patient's right shoulder arthroplasty is incompletely imaged but appears grossly unremarkable. The visualized musculature is unremarkable in appearance. Diffuse calcification is noted about the patient's bilateral breast implants.  Review of the MIP images confirms the above findings.  CT ABDOMEN and PELVIS FINDINGS  Hepatobiliary: The liver is unremarkable in appearance. The patient is status post cholecystectomy, with clips noted at the gallbladder fossa. There is corresponding distention of the intrahepatic biliary ducts and common bile duct.  Pancreas: The pancreas is atrophic and grossly unremarkable in appearance.  Spleen: The spleen is unremarkable in appearance.  Adrenals/Urinary Tract: Minimal calcification is noted at the left adrenal gland, likely adjacent to a small underlying adrenal lesion. The right adrenal gland is unremarkable.  A right renal cyst is again noted, better characterized on the prior study. The kidneys are otherwise unremarkable. There is no  evidence of hydronephrosis. No perinephric stranding is seen.  Stomach/Bowel: The stomach is unremarkable in appearance. The small bowel is within normal limits. The appendix is normal in caliber, without evidence of appendicitis. The colon is unremarkable in appearance.  Vascular/Lymphatic: Diffuse calcification is seen along the abdominal aorta and its branches, including along the right renal artery and superior mesenteric artery. There is likely relatively severe luminal narrowing at the proximal superior mesenteric artery. The abdominal aorta is otherwise grossly unremarkable. The inferior vena cava is grossly unremarkable. No retroperitoneal lymphadenopathy is seen. No pelvic sidewall lymphadenopathy is identified.  Reproductive: The bladder is mildly distended and grossly unremarkable. The uterus is unremarkable in appearance. The ovaries are grossly symmetric. No suspicious adnexal masses are seen.  Other: No additional soft tissue abnormalities are seen.  Musculoskeletal: No acute osseous abnormalities are identified. The patient's right hip arthroplasty is incompletely imaged, but appears grossly unremarkable. The patient is status post lumbar spinal fusion at L1-L3, with underlying decompression from L1-L4. The visualized musculature is unremarkable in appearance.  Review of the MIP images confirms the above findings.  IMPRESSION: 1. Apparent subacute or chronic pulmonary embolus within the pulmonary artery to the left lower lung lobe, extending minimally into segmental branches. RV/LV ratio of 1.1 may reflect right heart strain. Would correlate with the patient's symptoms. 2. Minimal bilateral atelectasis noted. Mild scarring at the lung apices. 3. Diffuse coronary artery calcifications seen. 4. Diffuse aortic atherosclerosis, with diffuse calcification along the right renal artery and superior mesenteric artery. Likely relatively severe luminal narrowing  at the proximal superior mesenteric artery. Would correlate for any associated symptoms.  Critical Value/emergent results were called by telephone at the time of interpretation on 03/01/2018 at 1:00 am to the ER clinical team, who verbally acknowledged these results.  Statin:  No. Beta Blocker:  No. Aspirin:  Yes.   ACEI:  No. ARB:  No. CCB use:  No Other antiplatelets/anticoagulants:  No.    ASSESSMENT/PLAN: This is a 77 y.o. female with SMA stenosis by CT abd/pelvis; also noted to have L PE and LLE DVT extending up to L femoral vein  Agree with IV heparin given subacute PE by CTA chest PE and R heart strain may explain clinical presentation Unclear if SMA stenosis contributing to abd discomfort given patients story and sudden onset of symptoms No impressive edema LLE; encouraged elevation of LLE for now; will likely have to resume anticoagulation, if ok with GI, at discharge This case will be discussed with Dr. [name] who will evaluate patient later today   Dagoberto Ligas PA-C [facility] [phone]   History and exam findings as above.  Pt has about a 6 week history of nausea and abdominal pain made worse with eating.  She is almost to the point of food fear.   GI bleed 2/18 colonoscopy showed some AVMs and divertucula, upper endo some gastritis.  CT scan shows calcified SMA but difficult to know how significant the stenosis may be due to poor contrast opacification.  This is also complicated by the fact she had a recent PE.   Serum albumin slightly decreased at 3.3  Would plan on continuing heparin for now.  Will put her on the schedule for mesenteric angio on Friday 03/05/18.  Probably best to keep in house since she will need oral anticoagulation on d/c for recent PE  Encouraged pt to eat what she can and take in protein supplements.  Ruta Hinds, MD [facility] Office: [phone] Pager: [phone]

## 2018-03-01 NOTE — ED Notes (Signed)
Bedside commode provided to patient for comfort.

## 2018-03-01 NOTE — ED Notes (Signed)
Dr. Alcario Drought confirmed patients activity adlib. After ambulating to BR with light 1 assist pt appears shob, RA sat 86%, pt placed on 2L [place], sat up to 98% in several minutes.  MD notified.

## 2018-03-01 NOTE — ED Provider Notes (Signed)
Assumed care from [facility] Foss, see prior notes for full H&P.  Briefly, 77 y.o. F here with nausea and sensation of dehydration.  Has had some abdominal pain but also increased SOB over the past few days.  Intermittently uses home O2.  xarelto discontinued last year due to GI bleeding.  Labs with mild AKI.  Has been given IVF.  K+ low, given supplementation here.  Plan:   Awaiting CTA chest and CT AP.  Will need admission.  Results for orders placed or performed during the hospital encounter of 02/28/18  Lipase, blood  Result Value Ref Range   Lipase 20 11 - 51 U/L  Comprehensive metabolic panel  Result Value Ref Range   Sodium 139 135 - 145 mmol/L   Potassium 2.9 (L) 3.5 - 5.1 mmol/L   Chloride 98 (L) 101 - 111 mmol/L   CO2 28 22 - 32 mmol/L   Glucose, Bld 97 65 - 99 mg/dL   BUN 17 6 - 20 mg/dL   Creatinine, Ser 1.35 (H) 0.44 - 1.00 mg/dL   Calcium 9.0 8.9 - 10.3 mg/dL   Total Protein 6.4 (L) 6.5 - 8.1 g/dL   Albumin 3.3 (L) 3.5 - 5.0 g/dL   AST 15 15 - 41 U/L   ALT 10 (L) 14 - 54 U/L   Alkaline Phosphatase 85 38 - 126 U/L   Total Bilirubin 0.3 0.3 - 1.2 mg/dL   GFR calc non Af Amer 37 (L) >60 mL/min   GFR calc Af Amer 43 (L) >60 mL/min   Anion gap 13 5 - 15  CBC  Result Value Ref Range   WBC 10.7 (H) 4.0 - 10.5 K/uL   RBC 4.52 3.87 - 5.11 MIL/uL   Hemoglobin 12.7 12.0 - 15.0 g/dL   HCT 39.8 36.0 - 46.0 %   MCV 88.1 78.0 - 100.0 fL   MCH 28.1 26.0 - 34.0 pg   MCHC 31.9 30.0 - 36.0 g/dL   RDW 14.8 11.5 - 15.5 %   Platelets 248 150 - 400 K/uL  Urinalysis, Routine w reflex microscopic  Result Value Ref Range   Color, Urine YELLOW YELLOW   APPearance CLEAR CLEAR   Specific Gravity, Urine 1.012 1.005 - 1.030   pH 5.0 5.0 - 8.0   Glucose, UA NEGATIVE NEGATIVE mg/dL   Hgb urine dipstick SMALL (A) NEGATIVE   Bilirubin Urine NEGATIVE NEGATIVE   Ketones, ur NEGATIVE NEGATIVE mg/dL   Protein, ur NEGATIVE NEGATIVE mg/dL   Nitrite NEGATIVE NEGATIVE   Leukocytes, UA TRACE (A)  NEGATIVE   RBC / HPF 0-5 0 - 5 RBC/hpf   WBC, UA 0-5 0 - 5 WBC/hpf   Bacteria, UA NONE SEEN NONE SEEN   Squamous Epithelial / LPF 0-5 (A) NONE SEEN   Mucus PRESENT    Hyaline Casts, UA PRESENT    Ct Angio Chest Pe W/cm &/or Wo Cm  Result Date: 03/01/2018 CLINICAL DATA:  Subacute onset of nausea. Generalized abdominal pain. Mild generalized chest pain and shortness of breath. EXAM: CT ANGIOGRAPHY CHEST CT ABDOMEN AND PELVIS WITH CONTRAST TECHNIQUE: Multidetector CT imaging of the chest was performed using the standard protocol during bolus administration of intravenous contrast. Multiplanar CT image reconstructions and MIPs were obtained to evaluate the vascular anatomy. Multidetector CT imaging of the abdomen and pelvis was performed using the standard protocol during bolus administration of intravenous contrast. CONTRAST:  80 mL of Isovue 370 IV contrast COMPARISON:  CT of the abdomen and pelvis performed 11/08/2017,  and CT of the thoracic spine performed 07/26/2014 FINDINGS: CTA CHEST FINDINGS Cardiovascular: There appears to be subacute or chronic pulmonary embolus within the pulmonary artery to the left lower lobe, extending minimally into segmental branches. No additional pulmonary embolus is identified. The RV/LV ratio of 1.1 may reflect right heart strain. Diffuse coronary artery calcifications are seen. Diffuse calcification is noted along the aortic arch and proximal great vessels. The heart remains normal in size. The patient is status post median sternotomy. There is a chronic fracture of the inferior-most sternal wire. Mediastinum/Nodes: The mediastinum is otherwise unremarkable in appearance. No mediastinal lymphadenopathy is seen. No pericardial effusion is identified. The thyroid gland is grossly unremarkable. No axillary lymphadenopathy is seen. Lungs/Pleura: Minimal bilateral atelectasis is noted. The lungs are otherwise clear. Mild scarring is noted at the lung apices. No significant  focal consolidation, pleural effusion or pneumothorax is seen. No masses are identified. Musculoskeletal: No acute osseous abnormalities are identified. The patient's right shoulder arthroplasty is incompletely imaged but appears grossly unremarkable. The visualized musculature is unremarkable in appearance. Diffuse calcification is noted about the patient's bilateral breast implants. Review of the MIP images confirms the above findings. CT ABDOMEN and PELVIS FINDINGS Hepatobiliary: The liver is unremarkable in appearance. The patient is status post cholecystectomy, with clips noted at the gallbladder fossa. There is corresponding distention of the intrahepatic biliary ducts and common bile duct. Pancreas: The pancreas is atrophic and grossly unremarkable in appearance. Spleen: The spleen is unremarkable in appearance. Adrenals/Urinary Tract: Minimal calcification is noted at the left adrenal gland, likely adjacent to a small underlying adrenal lesion. The right adrenal gland is unremarkable. A right renal cyst is again noted, better characterized on the prior study. The kidneys are otherwise unremarkable. There is no evidence of hydronephrosis. No perinephric stranding is seen. Stomach/Bowel: The stomach is unremarkable in appearance. The small bowel is within normal limits. The appendix is normal in caliber, without evidence of appendicitis. The colon is unremarkable in appearance. Vascular/Lymphatic: Diffuse calcification is seen along the abdominal aorta and its branches, including along the right renal artery and superior mesenteric artery. There is likely relatively severe luminal narrowing at the proximal superior mesenteric artery. The abdominal aorta is otherwise grossly unremarkable. The inferior vena cava is grossly unremarkable. No retroperitoneal lymphadenopathy is seen. No pelvic sidewall lymphadenopathy is identified. Reproductive: The bladder is mildly distended and grossly unremarkable. The uterus  is unremarkable in appearance. The ovaries are grossly symmetric. No suspicious adnexal masses are seen. Other: No additional soft tissue abnormalities are seen. Musculoskeletal: No acute osseous abnormalities are identified. The patient's right hip arthroplasty is incompletely imaged, but appears grossly unremarkable. The patient is status post lumbar spinal fusion at L1-L3, with underlying decompression from L1-L4. The visualized musculature is unremarkable in appearance. Review of the MIP images confirms the above findings. IMPRESSION: 1. Apparent subacute or chronic pulmonary embolus within the pulmonary artery to the left lower lung lobe, extending minimally into segmental branches. RV/LV ratio of 1.1 may reflect right heart strain. Would correlate with the patient's symptoms. 2. Minimal bilateral atelectasis noted. Mild scarring at the lung apices. 3. Diffuse coronary artery calcifications seen. 4. Diffuse aortic atherosclerosis, with diffuse calcification along the right renal artery and superior mesenteric artery. Likely relatively severe luminal narrowing at the proximal superior mesenteric artery. Would correlate for any associated symptoms. Critical Value/emergent results were called by telephone at the time of interpretation on 03/01/2018 at 1:00 am to the ER clinical team, who verbally acknowledged these results.  Electronically Signed   By: Garald Balding M.D.   On: 03/01/2018 01:03   Ct Abdomen Pelvis W Contrast  Result Date: 03/01/2018 CLINICAL DATA:  Subacute onset of nausea. Generalized abdominal pain. Mild generalized chest pain and shortness of breath. EXAM: CT ANGIOGRAPHY CHEST CT ABDOMEN AND PELVIS WITH CONTRAST TECHNIQUE: Multidetector CT imaging of the chest was performed using the standard protocol during bolus administration of intravenous contrast. Multiplanar CT image reconstructions and MIPs were obtained to evaluate the vascular anatomy. Multidetector CT imaging of the abdomen and  pelvis was performed using the standard protocol during bolus administration of intravenous contrast. CONTRAST:  80 mL of Isovue 370 IV contrast COMPARISON:  CT of the abdomen and pelvis performed 11/08/2017, and CT of the thoracic spine performed 07/26/2014 FINDINGS: CTA CHEST FINDINGS Cardiovascular: There appears to be subacute or chronic pulmonary embolus within the pulmonary artery to the left lower lobe, extending minimally into segmental branches. No additional pulmonary embolus is identified. The RV/LV ratio of 1.1 may reflect right heart strain. Diffuse coronary artery calcifications are seen. Diffuse calcification is noted along the aortic arch and proximal great vessels. The heart remains normal in size. The patient is status post median sternotomy. There is a chronic fracture of the inferior-most sternal wire. Mediastinum/Nodes: The mediastinum is otherwise unremarkable in appearance. No mediastinal lymphadenopathy is seen. No pericardial effusion is identified. The thyroid gland is grossly unremarkable. No axillary lymphadenopathy is seen. Lungs/Pleura: Minimal bilateral atelectasis is noted. The lungs are otherwise clear. Mild scarring is noted at the lung apices. No significant focal consolidation, pleural effusion or pneumothorax is seen. No masses are identified. Musculoskeletal: No acute osseous abnormalities are identified. The patient's right shoulder arthroplasty is incompletely imaged but appears grossly unremarkable. The visualized musculature is unremarkable in appearance. Diffuse calcification is noted about the patient's bilateral breast implants. Review of the MIP images confirms the above findings. CT ABDOMEN and PELVIS FINDINGS Hepatobiliary: The liver is unremarkable in appearance. The patient is status post cholecystectomy, with clips noted at the gallbladder fossa. There is corresponding distention of the intrahepatic biliary ducts and common bile duct. Pancreas: The pancreas is  atrophic and grossly unremarkable in appearance. Spleen: The spleen is unremarkable in appearance. Adrenals/Urinary Tract: Minimal calcification is noted at the left adrenal gland, likely adjacent to a small underlying adrenal lesion. The right adrenal gland is unremarkable. A right renal cyst is again noted, better characterized on the prior study. The kidneys are otherwise unremarkable. There is no evidence of hydronephrosis. No perinephric stranding is seen. Stomach/Bowel: The stomach is unremarkable in appearance. The small bowel is within normal limits. The appendix is normal in caliber, without evidence of appendicitis. The colon is unremarkable in appearance. Vascular/Lymphatic: Diffuse calcification is seen along the abdominal aorta and its branches, including along the right renal artery and superior mesenteric artery. There is likely relatively severe luminal narrowing at the proximal superior mesenteric artery. The abdominal aorta is otherwise grossly unremarkable. The inferior vena cava is grossly unremarkable. No retroperitoneal lymphadenopathy is seen. No pelvic sidewall lymphadenopathy is identified. Reproductive: The bladder is mildly distended and grossly unremarkable. The uterus is unremarkable in appearance. The ovaries are grossly symmetric. No suspicious adnexal masses are seen. Other: No additional soft tissue abnormalities are seen. Musculoskeletal: No acute osseous abnormalities are identified. The patient's right hip arthroplasty is incompletely imaged, but appears grossly unremarkable. The patient is status post lumbar spinal fusion at L1-L3, with underlying decompression from L1-L4. The visualized musculature is unremarkable in appearance.  Review of the MIP images confirms the above findings. IMPRESSION: 1. Apparent subacute or chronic pulmonary embolus within the pulmonary artery to the left lower lung lobe, extending minimally into segmental branches. RV/LV ratio of 1.1 may reflect right  heart strain. Would correlate with the patient's symptoms. 2. Minimal bilateral atelectasis noted. Mild scarring at the lung apices. 3. Diffuse coronary artery calcifications seen. 4. Diffuse aortic atherosclerosis, with diffuse calcification along the right renal artery and superior mesenteric artery. Likely relatively severe luminal narrowing at the proximal superior mesenteric artery. Would correlate for any associated symptoms. Critical Value/emergent results were called by telephone at the time of interpretation on 03/01/2018 at 1:00 am to the ER clinical team, who verbally acknowledged these results. Electronically Signed   By: Garald Balding M.D.   On: 03/01/2018 01:03   1:12 AM CT with subacute vs chronic PE right in the LLL.  No acute findings in abdomen/pelvis.  Patient has been increasingly SOB at home, has increased her home O2 over the past few days because of this.  On repeat exam she is not extremely labored, however given her symptoms feel this should be treated.  Given her history of GI bleeding, will start heparin drip so this can be reversed if needed.  I have reviewed results and plan with patient and her family ,she is comfortable re-starting blood thinners at this time.    Patient will be admitted to medicine service for ongoing care.  CRITICAL CARE Performed by: Larene Pickett   Total critical care time: 31 minutes  Critical care time was exclusive of separately billable procedures and treating other patients.  Critical care was necessary to treat or prevent imminent or life-threatening deterioration.  Critical care was time spent personally by me on the following activities: development of treatment plan with patient and/or surrogate as well as nursing, discussions with consultants, evaluation of patient's response to treatment, examination of patient, obtaining history from patient or surrogate, ordering and performing treatments and interventions, ordering and review of  laboratory studies, ordering and review of radiographic studies, pulse oximetry and re-evaluation of patient's condition.    Larene Pickett, PA-C 03/01/18 [place], [place], DO 03/01/18 [id number]

## 2018-03-01 NOTE — ED Provider Notes (Signed)
[facility] EMERGENCY DEPARTMENT Provider Note   CSN: [id number] Arrival date & time: 02/28/18  [place]     History   Chief Complaint Chief Complaint  Patient presents with  . Nausea    HPI [name] is a 77 y.o. female.  HPI 77 year old Caucasian female past medical history significant for CAD status post CABG in 7616, diastolic heart failure last EF 60% in 2018, COPD currently on as needed oxygen, DVT not currently anticoagulated due to GI bleed last year that presents to the emergency department today with complaints of 2 weeks of nausea and worsening weakness.  Patient reports nausea but denies any emesis.  She reports generalized fatigue and weakness.  States she has no energy to get out of bed.  She does report some intermittent shortness of breath with exertion and reports some substernal chest pain that does not radiate but denies any at this time.  The chest pain is not exertional.  It is substernal does not radiate.  Denies any associated diaphoresis with the episode.  She does report some intermittent diffuse abdominal cramping but denies any at this time.  Patient denies any associated diarrhea, bloody stools.  She does report having an appetite.  Patient reports history of DVT and was taken off her Xarelto last year after GI bleed.  She does states that she wears oxygen as needed at home for her COPD.  Patient states that she has had increase her oxygen over the past few days due to feeling more short of breath and tachypnea.  Patient denies any urinary symptoms.  Nothing makes her symptoms better or worse.  She denies any associated lightheadedness or dizziness.  Pt denies any fever, chill, ha, vision changes, lightheadedness, dizziness, congestion, neck pain, cp, cough, urinary symptoms, change in bowel habits, melena, hematochezia, lower extremity paresthesias.  Past Medical History:  Diagnosis Date  . Anemia    bld. transfusion post lumbar surgery- 2012   . Arthralgia    NOS  . Blood transfusion 2011   WITH BACK SURGERY  . CAD (coronary artery disease)    s/p CABG 2004; s/p DES to LM in 2010;  [place] 10/29/11: EF 50-55%, mild elevated filling pressures, no pulmonary HTN, LM 90% ISR, LAD and CFX occluded, S-RI occluded (old), S-OM3 ok and L-LAD ok, native nondominant RCA 95% -  med rx recommended ; Lexiscan Myoview 7/13 at [facility]: demonstrated "normal LV function, anterior attenuation and localized ischemia, inferior, basilar, mid section"  . Carotid artery occlusion    moderate  . Chronic diastolic heart failure (HCC)    Echo 9/10: EF 07-37%, grade 1 diastolic dysfunction  . COPD (chronic obstructive pulmonary disease) ([place])    Emphysema dxed by Dr. Woody Seller in [place] based on PFTs per pt in 2006; placed on albuterol  . Depression    TAKES CELEXA  AND  (OFF- WELBUTRIN)  . DVT of lower extremity (deep venous thrombosis) (HCC)    recurrent. bilateral (2 episodes)  . Exertional angina (HCC)    Treated with Isosorbide, Ranexa, amlodipine; intolerant to metoprolol  . GERD (gastroesophageal reflux disease)   . HLD (hyperlipidemia)   . Hypertension   . Hyperthyroidism   . Obesity (BMI 30-39.9) 2009   BMI 33  . Osteoarthritis   . PVD (peripheral vascular disease) ([place])   . Sleep apnea 2012   USED CPAP THEN  STARTED USING SPIRIVA , Nov. 2013- last evaluation , changed from mask to aparatus that is just for her  nose..     Patient Active Problem List   Diagnosis Date Noted  . History of laparoscopic cholecystectomy 12/07/2017  . Heme positive stool   . Angiodysplasia of intestine   . Hematochezia   . Malnutrition of moderate degree 02/15/2017  . GI bleed 02/13/2017  . Blood loss anemia 02/13/2017  . Impingement syndrome of left shoulder 02/10/2013  . Chest pain 11/17/2011  . PAD (peripheral artery disease) ([place]) 11/17/2011  . Dyslipidemia 10/17/2011  . Preop cardiovascular exam 06/19/2011  . CAD (coronary artery disease)   . DEEP  VENOUS THROMBOPHLEBITIS 12/27/2008  . Hypothyroidism 05/03/2008  . Essential hypertension 05/03/2008  . EMPHYSEMA 05/03/2008  . OBSTRUCTIVE SLEEP APNEA 05/03/2008  . COPD exacerbation ([place]) 04/12/2008  . SHORTNESS OF BREATH (SOB) 04/12/2008  . SNORING 04/12/2008    Past Surgical History:  Procedure Laterality Date  . ANKLE SURGERY  2004  . [place]   lower; another scheduled, opt. reports 4 back- lumbar, 3 cerv. fusions  for later 2009  . BREAST ENHANCEMENT SURGERY    . CARDIAC CATHETERIZATION  11/2009   Patent LIMA to LAD and patent SVG to OM1. Occluded SVG to ramus and diagonal. Left main: 90% ostial stenosis, LCX 60-70% proximal stenosis  . COLONOSCOPY WITH PROPOFOL N/A 02/17/2017   Procedure: COLONOSCOPY WITH PROPOFOL;  Surgeon: Jerene Bears, MD;  Location: [facility] ENDOSCOPY;  Service: Endoscopy;  Laterality: N/A;  . CORONARY ANGIOPLASTY WITH STENT PLACEMENT  11/2009   Drug eluting stent to left main artery: 4.0 X 12 mm Ion   . CORONARY ARTERY BYPASS GRAFT  2004  . ESOPHAGOGASTRODUODENOSCOPY N/A 02/14/2017   Procedure: ESOPHAGOGASTRODUODENOSCOPY (EGD);  Surgeon: Doran Stabler, MD;  Location: [facility] ENDOSCOPY;  Service: Endoscopy;  Laterality: N/A;  . EYE SURGERY  2006   BILATERAL CAT EXT.   Marland Kitchen GIVENS CAPSULE STUDY N/A 02/15/2017   Procedure: GIVENS CAPSULE STUDY;  Surgeon: Doran Stabler, MD;  Location: [place];  Service: Endoscopy;  Laterality: N/A;  . GROIN MASS OPEN BIOPSY  2004  . JOINT REPLACEMENT  2012    BILATERAL KNEES  . JOINT REPLACEMENT  2010    RIGHT HIP REPLACEMENT  . NECK SURGERY  12/08   Due for repeat neck surgery  . REVERSE SHOULDER ARTHROPLASTY  02/26/2012   Procedure: REVERSE SHOULDER ARTHROPLASTY;  Surgeon: Marin Shutter, MD;  Location: [place];  Service: Orthopedics;  Laterality: Right;  RIGHT SHOULDER REVERSED ARTHROPLASTY  . SHOULDER ARTHROSCOPY WITH SUBACROMIAL DECOMPRESSION Left 02/10/2013   Procedure: SHOULDER ARTHROSCOPY WITH SUBACROMIAL  DECOMPRESSION DISTAL CLAVICLE RESECTION;  Surgeon: Marin Shutter, MD;  Location: [place];  Service: Orthopedics;  Laterality: Left;  DISTAL CLAVICLE RESECTION  . TOTAL HIP ARTHROPLASTY  2007   Right    OB History    No data available       Home Medications    Prior to Admission medications   Medication Sig Start Date End Date Taking? Authorizing Provider  allopurinol (ZYLOPRIM) 100 MG tablet Take 100 mg by mouth daily. 01/30/17  Yes [provider]  aspirin EC 81 MG tablet Take 81 mg by mouth daily.   Yes [provider]  citalopram (CELEXA) 20 MG tablet Take 20 mg by mouth 3 (three) times daily.    Yes [provider]  [employer] D3 1000 units capsule Take 1,000 Units by mouth daily. 01/14/18  Yes [provider]  ferrous sulfate 325 (65 FE) MG tablet Take 325 mg by mouth daily  with breakfast.   Yes [provider]  INCRUSE ELLIPTA 62.5 MCG/INH AEPB Inhale 1 puff into the lungs 2 (two) times daily.  11/30/16  Yes [provider]  Ipratropium-Albuterol (COMBIVENT) 20-100 MCG/ACT AERS respimat Inhale 1 puff into the lungs every 6 (six) hours as needed for wheezing.   Yes [provider]  isosorbide mononitrate (IMDUR) 120 MG 24 hr tablet Take 120 mg by mouth daily.   Yes [provider]  nitroGLYCERIN (NITROSTAT) 0.4 MG SL tablet Place 1 tablet (0.4 mg total) under the tongue every 5 (five) minutes as needed for chest pain. For chest pain 04/24/16  Yes Fay Records, MD  omeprazole (PRILOSEC) 40 MG capsule Take 40 mg by mouth 2 (two) times daily.    Yes [provider]  Oxycodone HCl 10 MG TABS Take 10 mg by mouth every 6 (six) hours as needed for pain. 01/25/17  Yes [provider]  pilocarpine (PILOCAR) 1 % ophthalmic solution Place 1 drop into both eyes 2 (two) times daily. 11/30/16  Yes [provider]  potassium chloride SA (K-DUR,KLOR-CON) 20 MEQ tablet Take 20 mEq by mouth daily.   Yes [provider]  pramipexole (MIRAPEX) 0.25 MG tablet Take 0.25 mg by mouth at bedtime. 01/14/18  Yes [provider]  roflumilast (DALIRESP) 500 MCG TABS tablet Take 500 mcg by mouth daily.    Yes [provider]  traMADol (ULTRAM) 50 MG tablet Take 150 tablets by mouth at bedtime.  11/08/17  Yes [provider]  traZODone (DESYREL) 50 MG tablet Take 150 mg by mouth at bedtime.    Yes [provider]    Family History Family History  Problem Relation Age of Onset  . Ovarian cancer Mother   . Cancer Mother   . Lung cancer Father   . COPD Father   . Heart disease Father   . Cancer Unknown   . Heart disease Unknown   . Colitis Unknown   . Heart disease Sister   . Cancer Sister     Social History Social History   Tobacco Use  . Smoking status: Former Smoker    Years: 45.00    Last attempt to quit: 12/22/2002    Years since quitting: 15.2  . Smokeless tobacco: Never Used  Substance Use Topics  . Alcohol use: No    Alcohol/week: 0.0 oz  . Drug use: No     Allergies   Zolpidem tartrate; Pitavastatin; Ropinirole; and Penicillins   Review of Systems Review of Systems  All other systems reviewed and are negative.    Physical Exam Updated Vital Signs BP (!) 109/48   Pulse 73   Temp 98.6 F (37 C) (Oral)   Resp 12   Ht 5\' 8"  (1.727 m)   Wt 77.1 kg (170 lb)   SpO2 98%   BMI 25.85 kg/m   Physical Exam  Constitutional: She is oriented to person, place, and time. She appears well-developed and well-nourished.  Non-toxic appearance. No distress.  HENT:  Head: Normocephalic and atraumatic.  Nose: Nose normal.  Mucous membranes dry.  Eyes: Conjunctivae are normal. Pupils are equal, round, and reactive to light. Right eye exhibits no discharge. Left eye exhibits no discharge.  Neck: Normal range of motion. Neck supple. No JVD present. No tracheal deviation present.  Cardiovascular: Normal rate, regular rhythm, normal heart sounds and  intact distal pulses. Exam reveals no gallop and no friction rub.  No murmur heard. Pulmonary/Chest: Effort normal  and breath sounds normal. No stridor. No respiratory distress. She has no wheezes. She has no rales. She exhibits no tenderness.  No hypoxia or tachypnea.  Abdominal: Soft. Bowel sounds are normal. She exhibits no distension. There is generalized tenderness. There is no rigidity, no rebound, no guarding, no CVA tenderness, no tenderness at McBurney's point and negative Murphy's sign.  Musculoskeletal: Normal range of motion.  No lower extremity edema or calf tenderness.  Lymphadenopathy:    She has no cervical adenopathy.  Neurological: She is alert and oriented to person, place, and time.  Skin: Skin is warm and dry. Capillary refill takes less than 2 seconds. She is not diaphoretic.  Poor skin turgor.  Psychiatric: Her behavior is normal. Judgment and thought content normal.  Nursing note and vitals reviewed.    ED Treatments / Results  Labs (all labs ordered are listed, but only abnormal results are displayed) Labs Reviewed  COMPREHENSIVE METABOLIC PANEL - Abnormal; Notable for the following components:      Result Value   Potassium 2.9 (*)    Chloride 98 (*)    Creatinine, Ser 1.35 (*)    Total Protein 6.4 (*)    Albumin 3.3 (*)    ALT 10 (*)    GFR calc non Af Amer 37 (*)    GFR calc Af Amer 43 (*)    All other components within normal limits  CBC - Abnormal; Notable for the following components:   WBC 10.7 (*)    All other components within normal limits  URINALYSIS, ROUTINE W REFLEX MICROSCOPIC - Abnormal; Notable for the following components:   Hgb urine dipstick SMALL (*)    Leukocytes, UA TRACE (*)    Squamous Epithelial / LPF 0-5 (*)    All other components within normal limits  LIPASE, BLOOD    EKG  EKG Interpretation  Date/Time:  Sunday February 28 2018 21:33:23 EDT Ventricular Rate:  64 PR Interval:    QRS Duration: 110 QT Interval:  468 QTC  Calculation: 483 R Axis:   -72 Text Interpretation:  Sinus rhythm Atrial premature complexes Incomplete left bundle branch block Probable left ventricular hypertrophy Baseline wander in lead(s) V6 No significant change since last tracing Confirmed by Merrily Pew [phone]) on 03/01/2018 12:21:00 AM       Radiology No results found.  Procedures Procedures (including critical care time)  Medications Ordered in ED Medications  iopamidol (ISOVUE-370) 76 % injection (not administered)  sodium chloride 0.9 % bolus 1,000 mL (0 mLs Intravenous Stopped 02/28/18 2324)  ondansetron (ZOFRAN) injection 4 mg (4 mg Intravenous Given 02/28/18 2208)  potassium chloride SA (K-DUR,KLOR-CON) CR tablet 40 mEq (40 mEq Oral Given 02/28/18 2324)  sodium chloride 0.9 % bolus 500 mL (500 mLs Intravenous New Bag/Given 03/01/18 0016)     Initial Impression / Assessment and Plan / ED Course  I have reviewed the triage vital signs and the nursing notes.  Pertinent labs & imaging results that were available during my care of the patient were reviewed by me and considered in my medical decision making (see chart for details).     Patient presents the ED for complaints of 2 weeks of nausea, weakness and fatigue.  She also reports some shortness of breath with increasing oxygen demand along with substernal chest pain but denies any at this time.  Reports decreased appetite and poor p.o. intake over the past 2 weeks.  Patient initially hypotensive in triage with 84/56.  She was also hypoxic at  91% and placed on 2 L of oxygen.  The patient states that this is baseline for her.  Lungs clear to auscultation bilaterally.  Heart regular rate and rhythm.  She does have generalized abdominal tenderness on palpation but no signs of peritonitis.  No CVA tenderness.  No lower extremity edema or calf tenderness.  Patient does appear dehydrated with dry mucous membranes and poor skin turgor.  Lab work reveals a hypokalemia of 2.9 with  a mild AK I of 1.3 baseline appears to be 0.8 0.9.  Slight decrease in albumin at 3.3.  No leukocytosis.  Hemoglobin is stable.  UA shows no signs of infection.  Troponin pending at this time.  EKG shows sinus rhythm with PACs but no signs of acute ischemia.  Calcium was replaced with right oral potassium and fluids.  Lab work consistent with dehydration.  Patient's EF was 60% last year.  Will give 1.5 fluid bolus.  Also given her nausea medicine.  Given history of DVTs and not currently anticoagulated with increased shortness of breath and oxygen demand will obtain PE CT study.  Given her nausea and diffuse abdominal pain we will also obtain imaging of her abdomen.  These are pending at this time.  His blood pressure has improved with fluid.  Feel the patient would benefit from admission for dehydration with hypotension and AK I.  Will wait for imaging studies before calling for admission.  Care handoff to [place]. Pt has pending at this time imaging and admission.  Disposition admission pending lab and test results. Care dicussed and plan agreed upon with oncoming PA. Pt updated on plan of care and is currently hemodynamically stable at this time with normal vs.    Pt also seen and eval by my attending who is agreeable with the above plan.    Final Clinical Impressions(s) / ED Diagnoses   Final diagnoses:  Nausea  AKI (acute kidney injury) [facility])  Dehydration    ED Discharge Orders    None       Aaron Edelman 03/01/18 0033    Mesner, [name], MD 03/02/18 Benancio Deeds

## 2018-03-01 NOTE — Progress Notes (Signed)
Initial Nutrition Assessment  DOCUMENTATION CODES:   Not applicable  INTERVENTION:   -D/c Ensure Enlive po BID, each supplement provides 350 kcal and 20 grams of protein -Boost Breeze po BID, each supplement provides 250 kcal and 9 grams of protein  NUTRITION DIAGNOSIS:   Increased nutrient needs related to chronic illness as evidenced by estimated needs.  GOAL:   Patient will meet greater than or equal to 90% of their needs  MONITOR:   PO intake, Supplement acceptance, Labs, Weight trends, Skin, I & O's  REASON FOR ASSESSMENT:   Malnutrition Screening Tool    ASSESSMENT:   [name] is a 77 y.o. female with medical history significant of CAD s/p CABG 4098, diastolic CHF with EF 11%, COPD currently on PRN O2 at home, prior DVT not currently anticoagulated due to GIB last year.  Patient presents to the ED with c/o 2-3 weeks of nausea, generalized weakness, SOB on exertion, needing to use more O2 at home on exertion than baseline.  Pt admitted with PE.   Case discussed with RN, who reports pt with good appetite and consumed most of her breakfast this AM.   Spoke with pt and family members at bedside, who endorse a decreased appetite over the past 3 weeks. Pt reports that she was consuming 2 meals per day (Breakfast: sausage and egg biscuit, Lunch: crackers). Per pt sister, pt generally has a very hearty appetite and consumes 3 large meals per day. Pt shares she has not been able to eat "due to being sick on my stomach". She tried Ensure supplement today, however, she did not tolerate well.   Pt reports she has lost approximately 6# over the past 3 weeks, however, this is not significant for time frame. UBW is around 170# per her report.  She denies any difficulty with ambulation, however, has felt weaker.   Discussed with pt importance of good meal and supplement intake to promote healing. Pt amenable to try Boost Breeze supplements.   Labs reviewed: K: 2.9 (on PO  supplementation).   NUTRITION - FOCUSED PHYSICAL EXAM:    Most Recent Value  Orbital Region  No depletion  Upper Arm Region  Mild depletion  Thoracic and Lumbar Region  No depletion  Buccal Region  No depletion  Temple Region  No depletion  Clavicle Bone Region  No depletion  Clavicle and Acromion Bone Region  No depletion  Scapular Bone Region  No depletion  Dorsal Hand  No depletion  Patellar Region  No depletion  Anterior Thigh Region  No depletion  Posterior Calf Region  No depletion  Edema (RD Assessment)  Mild  Hair  Reviewed  Eyes  Reviewed  Mouth  Reviewed  Skin  Reviewed  Nails  Reviewed       Diet Order:  Diet Heart Room service appropriate? Yes; Fluid consistency: Thin  EDUCATION NEEDS:   Education needs have been addressed  Skin:  Skin Assessment: Reviewed RN Assessment  Last BM:  PTA  Height:   Ht Readings from Last 1 Encounters:  03/01/18 5\' 8"  (1.727 m)    Weight:   Wt Readings from Last 1 Encounters:  03/01/18 171 lb 8 oz (77.8 kg)    Ideal Body Weight:  63.6 kg  BMI:  Body mass index is 26.08 kg/m.  Estimated Nutritional Needs:   Kcal:  1700-1900  Protein:  85-100 grams  Fluid:  1.7-1.9 L    Jaonna Word A. Jimmye Norman, RD, LDN, CDE Pager: [phone] After hours Pager:  [phone] 

## 2018-03-01 NOTE — Progress Notes (Addendum)
ANTICOAGULATION CONSULT NOTE - Follow Up Consult  Pharmacy Consult for Heparin Indication: pulmonary embolus  Allergies  Allergen Reactions  . Zolpidem Tartrate Anxiety    CONFUSION   . Pitavastatin Other (See Comments)    Not sure  . Ropinirole Nausea Only    Makes legs jump  . Penicillins Rash    Patient Measurements: Height: 5\' 8"  (172.7 cm) Weight: 171 lb 8 oz (77.8 kg) IBW/kg (Calculated) : 63.9 Heparin Dosing Weight: 77 kg  Vital Signs: Temp: 97.3 F (36.3 C) (03/11 0748) Temp Source: Oral (03/11 0748) BP: 115/72 (03/11 0748) Pulse Rate: 70 (03/11 0748)  Labs: Recent Labs    02/28/18 1924 03/01/18 0102 03/01/18 0923  HGB 12.7  --   --   HCT 39.8  --   --   PLT 248  --   --   HEPARINUNFRC  --   --  0.70  CREATININE 1.35*  --   --   TROPONINI  --  <0.03  --     Estimated Creatinine Clearance: 38.3 mL/min (A) (by C-G formula based on SCr of 1.35 mg/dL (H)).   Medications:  Infusions:  . heparin 1,250 Units/hr (03/01/18 0252)    Assessment: 77 year old female has a history of DVT treated with Xarelto that was stopped due to prior GI bleed now admitted with acute versus chronic pulmonary embolism per CT-Angio. Patient was not on any anticoagulants prior to this admission. Pharmacy has been consulted to dose IV Heparin for pulmonary embolism.   Initial Heparin level is therapeutic at 0.70 on 1250 units/hr.  This is the top of the therapeutic range, but may represent some bolus effect. CBC is within normal limits. Troponin is negative. LE dopplers show at positive left lower extremity acute DVT involving the left femoral vein. Patient is currently resting on her side but still requiring 3L [place]. RN confirmed that the patient has not had any bleeding.   Goal of Therapy:  Heparin level 0.3-0.7 units/ml Monitor platelets by anticoagulation protocol: Yes   Plan:  Continue Heparin at 1250 units/hr.  Repeat Heparin level in 8 hours.  Daily Heparin level and CBC  while on therapy.  Follow-up plan for oral anticoagulation  Sloan Leiter, PharmD, BCPS, BCCCP Clinical Pharmacist Clinical phone 03/01/2018 until 3:30PM - #[phone] After hours, please call #[phone] 03/01/2018,10:50 AM

## 2018-03-01 NOTE — Patient Outreach (Signed)
[facility]) Care Management  03/01/2018  KALIANNE FETTING [birth year] [id number]   RN Health Coach Hospitalization  Referral Date: 02/23/2017 Referral Source: EMMI COPD program/Hospital admission Reason for Referral: EMMI COPD alert Insurance: Medicare   Outreach Attempt:  Received notification patient hospitalized at [facility] for Pulmonary Emboli and DVT.  Currently being treated with IV Heparin  Plan:  Hospital Liaison notified of patient's admission status.  RN Health Coach will await return communication from [facility] prior to discipline closure.  [place] [phone] [email or web address]

## 2018-03-01 NOTE — ED Provider Notes (Signed)
Medical screening examination/treatment/procedure(s) were conducted as a shared visit with non-physician practitioner(s) and myself.  I personally evaluated the patient during the encounter.  He has multiple complaints but most persistent complaints are nausea, weakness and dyspnea on exertion with chest pain.  History of coronary disease and blood clots but not on blood thinners secondary to GI bleeding. My exam patient's nausea is improved and her respiratory rate is slightly high with a low oxygen. Abdomen benign.  She is pending CT scans of her chest for PE, abdomen pelvis for the nausea and weakness.  Even if these are negative patient will need to be admitted for ACS rule out.   EKG Interpretation  Date/Time:  Sunday February 28 2018 21:33:23 EDT Ventricular Rate:  64 PR Interval:    QRS Duration: 110 QT Interval:  468 QTC Calculation: 483 R Axis:   -72 Text Interpretation:  Sinus rhythm Atrial premature complexes Incomplete left bundle branch block Probable left ventricular hypertrophy Baseline wander in lead(s) V6 No significant change since last tracing Confirmed by Merrily Pew [phone]) on 03/01/2018 12:21:00 AM Also confirmed by Merrily Pew [phone]), editor Philomena Doheny [phone])  on 03/01/2018 7:22:13 AM         [place] Guitron, [name], MD 03/02/18 6945

## 2018-03-01 NOTE — Plan of Care (Signed)
  Education: Knowledge of General Education information will improve 03/01/2018 2331 - Progressing by Tristan Schroeder, RN   Health Behavior/Discharge Planning: Ability to manage health-related needs will improve 03/01/2018 2331 - Progressing by Tristan Schroeder, RN

## 2018-03-01 NOTE — H&P (Signed)
History and Physical    [name]:[id number] DOB: 1941/09/15 DOA: 02/28/2018  PCP: [name], MD  Patient coming from: Home  I have personally briefly reviewed patient's old medical records in [place]  Chief Complaint: Nausea  HPI: [name] is a 77 y.o. female with medical history significant of CAD s/p CABG 5852, diastolic CHF with EF 77%, COPD currently on PRN O2 at home, prior DVT not currently anticoagulated due to GIB last year.  Patient presents to the ED with c/o 2-3 weeks of nausea, generalized weakness, SOB on exertion, needing to use more O2 at home on exertion than baseline.  Some substernal non-radiating CP.  CP non exertional.   ED Course: Patient with subacute PEs seen on CTA chest.  Suggestion of R heart strain as well on CTA chest.  Also of note CT seems to show possible severe narrowing along SMA.   Review of Systems: As per HPI otherwise 10 point review of systems negative.   Past Medical History:  Diagnosis Date  . Anemia    bld. transfusion post lumbar surgery- 2012  . Arthralgia    NOS  . Blood transfusion 2011   WITH BACK SURGERY  . CAD (coronary artery disease)    s/p CABG 2004; s/p DES to LM in 2010;  [place] 10/29/11: EF 50-55%, mild elevated filling pressures, no pulmonary HTN, LM 90% ISR, LAD and CFX occluded, S-RI occluded (old), S-OM3 ok and L-LAD ok, native nondominant RCA 95% -  med rx recommended ; Lexiscan Myoview 7/13 at [facility]: demonstrated "normal LV function, anterior attenuation and localized ischemia, inferior, basilar, mid section"  . Carotid artery occlusion    moderate  . Chronic diastolic heart failure (HCC)    Echo 9/10: EF 82-42%, grade 1 diastolic dysfunction  . COPD (chronic obstructive pulmonary disease) ([place])    Emphysema dxed by Dr. Woody Seller in [place] based on PFTs per pt in 2006; placed on albuterol  . Depression    TAKES CELEXA  AND  (OFF- WELBUTRIN)  . DVT of lower extremity (deep venous  thrombosis) (HCC)    recurrent. bilateral (2 episodes)  . Exertional angina (HCC)    Treated with Isosorbide, Ranexa, amlodipine; intolerant to metoprolol  . GERD (gastroesophageal reflux disease)   . HLD (hyperlipidemia)   . Hypertension   . Hyperthyroidism   . Obesity (BMI 30-39.9) 2009   BMI 33  . Osteoarthritis   . PVD (peripheral vascular disease) ([place])   . Sleep apnea 2012   USED CPAP THEN  STARTED USING SPIRIVA , Nov. 2013- last evaluation , changed from mask to aparatus that is just for her nose.Marland Kitchen     Past Surgical History:  Procedure Laterality Date  . ANKLE SURGERY  2004  . [place]   lower; another scheduled, opt. reports 4 back- lumbar, 3 cerv. fusions  for later 2009  . BREAST ENHANCEMENT SURGERY    . CARDIAC CATHETERIZATION  11/2009   Patent LIMA to LAD and patent SVG to OM1. Occluded SVG to ramus and diagonal. Left main: 90% ostial stenosis, LCX 60-70% proximal stenosis  . COLONOSCOPY WITH PROPOFOL N/A 02/17/2017   Procedure: COLONOSCOPY WITH PROPOFOL;  Surgeon: Jerene Bears, MD;  Location: [facility] ENDOSCOPY;  Service: Endoscopy;  Laterality: N/A;  . CORONARY ANGIOPLASTY WITH STENT PLACEMENT  11/2009   Drug eluting stent to left main artery: 4.0 X 12 mm Ion   . CORONARY ARTERY BYPASS GRAFT  2004  .  ESOPHAGOGASTRODUODENOSCOPY N/A 02/14/2017   Procedure: ESOPHAGOGASTRODUODENOSCOPY (EGD);  Surgeon: Doran Stabler, MD;  Location: [facility] ENDOSCOPY;  Service: Endoscopy;  Laterality: N/A;  . EYE SURGERY  2006   BILATERAL CAT EXT.   Marland Kitchen GIVENS CAPSULE STUDY N/A 02/15/2017   Procedure: GIVENS CAPSULE STUDY;  Surgeon: Doran Stabler, MD;  Location: [place];  Service: Endoscopy;  Laterality: N/A;  . GROIN MASS OPEN BIOPSY  2004  . JOINT REPLACEMENT  2012    BILATERAL KNEES  . JOINT REPLACEMENT  2010    RIGHT HIP REPLACEMENT  . NECK SURGERY  12/08   Due for repeat neck surgery  . REVERSE SHOULDER ARTHROPLASTY  02/26/2012   Procedure: REVERSE SHOULDER ARTHROPLASTY;   Surgeon: Marin Shutter, MD;  Location: [place];  Service: Orthopedics;  Laterality: Right;  RIGHT SHOULDER REVERSED ARTHROPLASTY  . SHOULDER ARTHROSCOPY WITH SUBACROMIAL DECOMPRESSION Left 02/10/2013   Procedure: SHOULDER ARTHROSCOPY WITH SUBACROMIAL DECOMPRESSION DISTAL CLAVICLE RESECTION;  Surgeon: Marin Shutter, MD;  Location: [place];  Service: Orthopedics;  Laterality: Left;  DISTAL CLAVICLE RESECTION  . TOTAL HIP ARTHROPLASTY  2007   Right     reports that she quit smoking about 15 years ago. She quit after 45.00 years of use. she has never used smokeless tobacco. She reports that she does not drink alcohol or use drugs.  Allergies  Allergen Reactions  . Zolpidem Tartrate Anxiety    CONFUSION   . Pitavastatin Other (See Comments)  . Ropinirole Other (See Comments)    Makes legs jump  . Penicillins Rash    Family History  Problem Relation Age of Onset  . Ovarian cancer Mother   . Cancer Mother   . Lung cancer Father   . COPD Father   . Heart disease Father   . Cancer Unknown   . Heart disease Unknown   . Colitis Unknown   . Heart disease Sister   . Cancer Sister      Prior to Admission medications   Medication Sig Start Date End Date Taking? Authorizing Provider  allopurinol (ZYLOPRIM) 100 MG tablet Take 100 mg by mouth daily. 01/30/17  Yes [provider]  aspirin EC 81 MG tablet Take 81 mg by mouth daily.   Yes [provider]  citalopram (CELEXA) 20 MG tablet Take 20 mg by mouth 3 (three) times daily.    Yes [provider]  [employer] D3 1000 units capsule Take 1,000 Units by mouth daily. 01/14/18  Yes [provider]  ferrous sulfate 325 (65 FE) MG tablet Take 325 mg by mouth daily with breakfast.   Yes [provider]  INCRUSE ELLIPTA 62.5 MCG/INH AEPB Inhale 1 puff into the lungs 2 (two) times daily.  11/30/16  Yes [provider]  Ipratropium-Albuterol (COMBIVENT) 20-100 MCG/ACT AERS respimat Inhale 1 puff into the lungs  every 6 (six) hours as needed for wheezing.   Yes [provider]  isosorbide mononitrate (IMDUR) 120 MG 24 hr tablet Take 120 mg by mouth daily.   Yes [provider]  nitroGLYCERIN (NITROSTAT) 0.4 MG SL tablet Place 1 tablet (0.4 mg total) under the tongue every 5 (five) minutes as needed for chest pain. For chest pain 04/24/16  Yes Fay Records, MD  omeprazole (PRILOSEC) 40 MG capsule Take 40 mg by mouth 2 (two) times daily.    Yes [provider]  Oxycodone HCl 10 MG TABS Take 10 mg by mouth every 6 (six) hours as needed for  pain. 01/25/17  Yes [provider]  pilocarpine (PILOCAR) 1 % ophthalmic solution Place 1 drop into both eyes 2 (two) times daily. 11/30/16  Yes [provider]  potassium chloride SA (K-DUR,KLOR-CON) 20 MEQ tablet Take 20 mEq by mouth daily.   Yes [provider]  pramipexole (MIRAPEX) 0.25 MG tablet Take 0.25 mg by mouth at bedtime. 01/14/18  Yes [provider]  roflumilast (DALIRESP) 500 MCG TABS tablet Take 500 mcg by mouth daily.    Yes [provider]  traMADol (ULTRAM) 50 MG tablet Take 150 tablets by mouth at bedtime.  11/08/17  Yes [provider]  traZODone (DESYREL) 50 MG tablet Take 150 mg by mouth at bedtime.    Yes [provider]    Physical Exam: Vitals:   02/28/18 2300 02/28/18 2330 02/28/18 2345 03/01/18 0030  BP: (!) 109/48  (!) 123/51 (!) 139/55  Pulse: 73 73  75  Resp: 12 15  17   Temp:      TempSrc:      SpO2: 98% 99%  100%  Weight:      Height:        Constitutional: NAD, calm, comfortable Eyes: PERRL, lids and conjunctivae normal ENMT: Mucous membranes are moist. Posterior pharynx clear of any exudate or lesions.Normal dentition.  Neck: normal, supple, no masses, no thyromegaly Respiratory: clear to auscultation bilaterally, no wheezing, no crackles. Normal respiratory effort. No accessory muscle use.  Cardiovascular: Regular rate and rhythm, no  murmurs / rubs / gallops. No extremity edema. 2+ pedal pulses. No carotid bruits.  Abdomen: no tenderness, no masses palpated. No hepatosplenomegaly. Bowel sounds positive.  Musculoskeletal: no clubbing / cyanosis. No joint deformity upper and lower extremities. Good ROM, no contractures. Normal muscle tone.  Skin: no rashes, lesions, ulcers. No induration Neurologic: CN 2-12 grossly intact. Sensation intact, DTR normal. Strength 5/5 in all 4.  Psychiatric: Normal judgment and insight. Alert and oriented x 3. Normal mood.    Labs on Admission: I have personally reviewed following labs and imaging studies  CBC: Recent Labs  Lab 02/28/18 1924  WBC 10.7*  HGB 12.7  HCT 39.8  MCV 88.1  PLT 417   Basic Metabolic Panel: Recent Labs  Lab 02/28/18 1924  NA 139  K 2.9*  CL 98*  CO2 28  GLUCOSE 97  BUN 17  CREATININE 1.35*  CALCIUM 9.0   GFR: Estimated Creatinine Clearance: 38.1 mL/min (A) (by C-G formula based on SCr of 1.35 mg/dL (H)). Liver Function Tests: Recent Labs  Lab 02/28/18 1924  AST 15  ALT 10*  ALKPHOS 85  BILITOT 0.3  PROT 6.4*  ALBUMIN 3.3*   Recent Labs  Lab 02/28/18 1924  LIPASE 20   No results for input(s): AMMONIA in the last 168 hours. Coagulation Profile: No results for input(s): INR, PROTIME in the last 168 hours. Cardiac Enzymes: No results for input(s): CKTOTAL, CKMB, CKMBINDEX, TROPONINI in the last 168 hours. BNP (last 3 results) Recent Labs    08/17/17 1038 09/14/17 0921  PROBNP 612 503   HbA1C: No results for input(s): HGBA1C in the last 72 hours. CBG: No results for input(s): GLUCAP in the last 168 hours. Lipid Profile: No results for input(s): CHOL, HDL, LDLCALC, TRIG, CHOLHDL, LDLDIRECT in the last 72 hours. Thyroid Function Tests: No results for input(s): TSH, T4TOTAL, FREET4, T3FREE, THYROIDAB in the last 72 hours. Anemia Panel: No results for input(s): VITAMINB12, FOLATE, FERRITIN, TIBC, IRON, RETICCTPCT in the last 72  hours. Urine  analysis:    Component Value Date/Time   COLORURINE YELLOW 02/28/2018 2119   APPEARANCEUR CLEAR 02/28/2018 2119   LABSPEC 1.012 02/28/2018 2119   PHURINE 5.0 02/28/2018 2119   GLUCOSEU NEGATIVE 02/28/2018 2119   HGBUR SMALL (A) 02/28/2018 2119   BILIRUBINUR NEGATIVE 02/28/2018 2119   KETONESUR NEGATIVE 02/28/2018 2119   PROTEINUR NEGATIVE 02/28/2018 2119   UROBILINOGEN 1.0 03/30/2011 0901   NITRITE NEGATIVE 02/28/2018 2119   LEUKOCYTESUR TRACE (A) 02/28/2018 2119    Radiological Exams on Admission: Ct Angio Chest Pe W/cm &/or Wo Cm  Result Date: 03/01/2018 CLINICAL DATA:  Subacute onset of nausea. Generalized abdominal pain. Mild generalized chest pain and shortness of breath. EXAM: CT ANGIOGRAPHY CHEST CT ABDOMEN AND PELVIS WITH CONTRAST TECHNIQUE: Multidetector CT imaging of the chest was performed using the standard protocol during bolus administration of intravenous contrast. Multiplanar CT image reconstructions and MIPs were obtained to evaluate the vascular anatomy. Multidetector CT imaging of the abdomen and pelvis was performed using the standard protocol during bolus administration of intravenous contrast. CONTRAST:  80 mL of Isovue 370 IV contrast COMPARISON:  CT of the abdomen and pelvis performed 11/08/2017, and CT of the thoracic spine performed 07/26/2014 FINDINGS: CTA CHEST FINDINGS Cardiovascular: There appears to be subacute or chronic pulmonary embolus within the pulmonary artery to the left lower lobe, extending minimally into segmental branches. No additional pulmonary embolus is identified. The RV/LV ratio of 1.1 may reflect right heart strain. Diffuse coronary artery calcifications are seen. Diffuse calcification is noted along the aortic arch and proximal great vessels. The heart remains normal in size. The patient is status post median sternotomy. There is a chronic fracture of the inferior-most sternal wire. Mediastinum/Nodes: The mediastinum is otherwise  unremarkable in appearance. No mediastinal lymphadenopathy is seen. No pericardial effusion is identified. The thyroid gland is grossly unremarkable. No axillary lymphadenopathy is seen. Lungs/Pleura: Minimal bilateral atelectasis is noted. The lungs are otherwise clear. Mild scarring is noted at the lung apices. No significant focal consolidation, pleural effusion or pneumothorax is seen. No masses are identified. Musculoskeletal: No acute osseous abnormalities are identified. The patient's right shoulder arthroplasty is incompletely imaged but appears grossly unremarkable. The visualized musculature is unremarkable in appearance. Diffuse calcification is noted about the patient's bilateral breast implants. Review of the MIP images confirms the above findings. CT ABDOMEN and PELVIS FINDINGS Hepatobiliary: The liver is unremarkable in appearance. The patient is status post cholecystectomy, with clips noted at the gallbladder fossa. There is corresponding distention of the intrahepatic biliary ducts and common bile duct. Pancreas: The pancreas is atrophic and grossly unremarkable in appearance. Spleen: The spleen is unremarkable in appearance. Adrenals/Urinary Tract: Minimal calcification is noted at the left adrenal gland, likely adjacent to a small underlying adrenal lesion. The right adrenal gland is unremarkable. A right renal cyst is again noted, better characterized on the prior study. The kidneys are otherwise unremarkable. There is no evidence of hydronephrosis. No perinephric stranding is seen. Stomach/Bowel: The stomach is unremarkable in appearance. The small bowel is within normal limits. The appendix is normal in caliber, without evidence of appendicitis. The colon is unremarkable in appearance. Vascular/Lymphatic: Diffuse calcification is seen along the abdominal aorta and its branches, including along the right renal artery and superior mesenteric artery. There is likely relatively severe luminal  narrowing at the proximal superior mesenteric artery. The abdominal aorta is otherwise grossly unremarkable. The inferior vena cava is grossly unremarkable. No retroperitoneal lymphadenopathy is seen. No pelvic sidewall lymphadenopathy is identified.  Reproductive: The bladder is mildly distended and grossly unremarkable. The uterus is unremarkable in appearance. The ovaries are grossly symmetric. No suspicious adnexal masses are seen. Other: No additional soft tissue abnormalities are seen. Musculoskeletal: No acute osseous abnormalities are identified. The patient's right hip arthroplasty is incompletely imaged, but appears grossly unremarkable. The patient is status post lumbar spinal fusion at L1-L3, with underlying decompression from L1-L4. The visualized musculature is unremarkable in appearance. Review of the MIP images confirms the above findings. IMPRESSION: 1. Apparent subacute or chronic pulmonary embolus within the pulmonary artery to the left lower lung lobe, extending minimally into segmental branches. RV/LV ratio of 1.1 may reflect right heart strain. Would correlate with the patient's symptoms. 2. Minimal bilateral atelectasis noted. Mild scarring at the lung apices. 3. Diffuse coronary artery calcifications seen. 4. Diffuse aortic atherosclerosis, with diffuse calcification along the right renal artery and superior mesenteric artery. Likely relatively severe luminal narrowing at the proximal superior mesenteric artery. Would correlate for any associated symptoms. Critical Value/emergent results were called by telephone at the time of interpretation on 03/01/2018 at 1:00 am to the ER clinical team, who verbally acknowledged these results. Electronically Signed   By: Garald Balding M.D.   On: 03/01/2018 01:03   Ct Abdomen Pelvis W Contrast  Result Date: 03/01/2018 CLINICAL DATA:  Subacute onset of nausea. Generalized abdominal pain. Mild generalized chest pain and shortness of breath. EXAM: CT  ANGIOGRAPHY CHEST CT ABDOMEN AND PELVIS WITH CONTRAST TECHNIQUE: Multidetector CT imaging of the chest was performed using the standard protocol during bolus administration of intravenous contrast. Multiplanar CT image reconstructions and MIPs were obtained to evaluate the vascular anatomy. Multidetector CT imaging of the abdomen and pelvis was performed using the standard protocol during bolus administration of intravenous contrast. CONTRAST:  80 mL of Isovue 370 IV contrast COMPARISON:  CT of the abdomen and pelvis performed 11/08/2017, and CT of the thoracic spine performed 07/26/2014 FINDINGS: CTA CHEST FINDINGS Cardiovascular: There appears to be subacute or chronic pulmonary embolus within the pulmonary artery to the left lower lobe, extending minimally into segmental branches. No additional pulmonary embolus is identified. The RV/LV ratio of 1.1 may reflect right heart strain. Diffuse coronary artery calcifications are seen. Diffuse calcification is noted along the aortic arch and proximal great vessels. The heart remains normal in size. The patient is status post median sternotomy. There is a chronic fracture of the inferior-most sternal wire. Mediastinum/Nodes: The mediastinum is otherwise unremarkable in appearance. No mediastinal lymphadenopathy is seen. No pericardial effusion is identified. The thyroid gland is grossly unremarkable. No axillary lymphadenopathy is seen. Lungs/Pleura: Minimal bilateral atelectasis is noted. The lungs are otherwise clear. Mild scarring is noted at the lung apices. No significant focal consolidation, pleural effusion or pneumothorax is seen. No masses are identified. Musculoskeletal: No acute osseous abnormalities are identified. The patient's right shoulder arthroplasty is incompletely imaged but appears grossly unremarkable. The visualized musculature is unremarkable in appearance. Diffuse calcification is noted about the patient's bilateral breast implants. Review of the  MIP images confirms the above findings. CT ABDOMEN and PELVIS FINDINGS Hepatobiliary: The liver is unremarkable in appearance. The patient is status post cholecystectomy, with clips noted at the gallbladder fossa. There is corresponding distention of the intrahepatic biliary ducts and common bile duct. Pancreas: The pancreas is atrophic and grossly unremarkable in appearance. Spleen: The spleen is unremarkable in appearance. Adrenals/Urinary Tract: Minimal calcification is noted at the left adrenal gland, likely adjacent to a small underlying adrenal lesion. The  right adrenal gland is unremarkable. A right renal cyst is again noted, better characterized on the prior study. The kidneys are otherwise unremarkable. There is no evidence of hydronephrosis. No perinephric stranding is seen. Stomach/Bowel: The stomach is unremarkable in appearance. The small bowel is within normal limits. The appendix is normal in caliber, without evidence of appendicitis. The colon is unremarkable in appearance. Vascular/Lymphatic: Diffuse calcification is seen along the abdominal aorta and its branches, including along the right renal artery and superior mesenteric artery. There is likely relatively severe luminal narrowing at the proximal superior mesenteric artery. The abdominal aorta is otherwise grossly unremarkable. The inferior vena cava is grossly unremarkable. No retroperitoneal lymphadenopathy is seen. No pelvic sidewall lymphadenopathy is identified. Reproductive: The bladder is mildly distended and grossly unremarkable. The uterus is unremarkable in appearance. The ovaries are grossly symmetric. No suspicious adnexal masses are seen. Other: No additional soft tissue abnormalities are seen. Musculoskeletal: No acute osseous abnormalities are identified. The patient's right hip arthroplasty is incompletely imaged, but appears grossly unremarkable. The patient is status post lumbar spinal fusion at L1-L3, with underlying  decompression from L1-L4. The visualized musculature is unremarkable in appearance. Review of the MIP images confirms the above findings. IMPRESSION: 1. Apparent subacute or chronic pulmonary embolus within the pulmonary artery to the left lower lung lobe, extending minimally into segmental branches. RV/LV ratio of 1.1 may reflect right heart strain. Would correlate with the patient's symptoms. 2. Minimal bilateral atelectasis noted. Mild scarring at the lung apices. 3. Diffuse coronary artery calcifications seen. 4. Diffuse aortic atherosclerosis, with diffuse calcification along the right renal artery and superior mesenteric artery. Likely relatively severe luminal narrowing at the proximal superior mesenteric artery. Would correlate for any associated symptoms. Critical Value/emergent results were called by telephone at the time of interpretation on 03/01/2018 at 1:00 am to the ER clinical team, who verbally acknowledged these results. Electronically Signed   By: Garald Balding M.D.   On: 03/01/2018 01:03    EKG: Independently reviewed.  Assessment/Plan Principal Problem:   Pulmonary embolism (HCC) Active Problems:   Essential hypertension   COPD (chronic obstructive pulmonary disease) (HCC)   CAD (coronary artery disease)    1. PE - suspect that the seen subacute PE is causing her symptoms of pulmonary HTN / cor pulmonale / increased O2 requirement with exertion 1. Heparin gtt 2. Tele monitor 3. Checking trop 4. 2d echo to look for R heart strain / pulmonary HTN 5. BLE venous duplex to look for DVT 2. Nausea - DDx includes nausea due to PE or possibly chronic mesenteric ischemia 1. 2d echo as above to look at heart strain from PE 2. May wish to discuss SMA stenosis with vascular in AM 3. HTN - 1. Due to report of low BPs in triage, will hold her home imdur for now 4. CAD - holding Imdur as above 5. COPD - continue home nebs  DVT prophylaxis: heparin gtt Code Status: Full Family  Communication: No family in room Disposition Plan: Home after admit Consults called: None Admission status: Admit to inpatient   Etta Quill DO [facility] Pager [phone]  If 7AM-7PM, please contact day team taking care of patient [email or web address] Password [email or web address]  03/01/2018, 2:18 AM

## 2018-03-01 NOTE — Progress Notes (Signed)
ANTICOAGULATION CONSULT NOTE - Follow Up Consult  Pharmacy Consult for Heparin Indication: pulmonary embolus  Allergies  Allergen Reactions  . Zolpidem Tartrate Anxiety    CONFUSION   . Pitavastatin Other (See Comments)    Not sure  . Ropinirole Nausea Only    Makes legs jump  . Penicillins Rash    Patient Measurements: Height: 5\' 8"  (172.7 cm) Weight: 171 lb 8 oz (77.8 kg) IBW/kg (Calculated) : 63.9 Heparin Dosing Weight: 77 kg  Vital Signs: Temp: 97.3 F (36.3 C) (03/11 0748) Temp Source: Oral (03/11 0748) BP: 115/72 (03/11 0748) Pulse Rate: 70 (03/11 0748)  Labs: Recent Labs    02/28/18 1924 03/01/18 0102 03/01/18 0923 03/01/18 1204 03/01/18 1903  HGB 12.7  --   --   --   --   HCT 39.8  --   --   --   --   PLT 248  --   --   --   --   HEPARINUNFRC  --   --  0.70  --  0.80*  CREATININE 1.35*  --   --  0.89  --   TROPONINI  --  <0.03  --   --   --     Estimated Creatinine Clearance: 58.1 mL/min (by C-G formula based on SCr of 0.89 mg/dL).   Medications:  Infusions:  . heparin 1,250 Units/hr (03/01/18 1827)    Assessment: 77 year old female has a history of DVT treated with Xarelto that was stopped due to prior GI bleed now admitted with acute versus chronic pulmonary embolism per CT-Angio. Patient was not on any anticoagulants prior to this admission. Pharmacy has been consulted to dose IV Heparin for pulmonary embolism.   Repeat heparin level came back supratherapeutic at 0.8, on 1250 units/hr. Heparin infusion is running into R arm and level was drawn on L side. CBC is within normal limits as of this morning. LE dopplers show at positive left lower extremity acute DVT involving the left femoral vein. No signs/symptoms of bleeding per nursing. No infusion issues.  Goal of Therapy:  Heparin level 0.3-0.7 units/ml Monitor platelets by anticoagulation protocol: Yes   Plan:  Reduce heparin to 1150 units/hr.  Repeat Heparin level in 8 hours.  Daily  Heparin level and CBC while on therapy.  Follow-up plan for oral anticoagulation  Doylene Canard, PharmD Clinical Pharmacist  Pager: [phone] Phone: [phone] 03/01/2018,7:42 PM

## 2018-03-01 NOTE — Progress Notes (Signed)
*  Preliminary Results* Bilateral lower extremity venous duplex completed. Right lower extremity is negative for deep vein thrombosis. Left lower extremity is positive for acute deep vein thrombosis involving the left common femoral vein. Unable to visualize external iliac vein and IVC. There is no evidence of Baker's cyst bilaterally.  Preliminary results discussed with Dr. [place] Pink.  03/01/2018 10:14 AM Maudry Mayhew, BS, RVT, RDCS, RDMS

## 2018-03-01 NOTE — Progress Notes (Signed)
PROGRESS NOTE  [name] EZM:[id number] DOB: [birth year] DOA: 02/28/2018 PCP: [name], MD  HPI/Recap of past 54 hours: 77 year old female with past medical history of CAD, PVD and COPD who is had worsening left hip issues that have gotten to the point where she does not ambulate much over the past month and presented to the emergency room on 3/10 with complaints of nausea, weakness and dyspnea on exertion and found to be in acute respiratory failure with hypoxia. Workup discovered multiplesubacute pulmonary embolus on CT scan. 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 Patient started on heparin and admitted to the hospitalist service.  This morning, patient still complains of continued persistent nausea with some abdominal discomfort. She also complains of severe left hip pain. Lower extreme Doppler revealed a large acute DVT in her left femoral.  Assessment/Plan: Principal Problem:   Pulmonary embolism (HCC)secondary to acute DVT: Suspect etiology is because patient has been minimally ambulatory secondary to surgery issues. On IV heparin. She previously had a DVT 15 years ago secondary to immobilization following surgery. At that time on Coumadin. Try xarelto ( start after evaluation by vascular surgery. See below.)heart strain seen on CT, checking echocardiogram Active Problems:   Hypothyroidism:continue Synthroid   Essential hypertension: Stable   COPD (chronic obstructive pulmonary disease) ([place]): Stable   CAD (coronary artery disease)   Mesenteric artery stenosis [facility]): Have asked vascular surgery for evaluation. Likely cause of patient's abdominal pain and persistent nausea   Acute on chronic respiratory failure with hypoxia (HCC)c: secondary to pulmonary embolus. Supplemental oxygen. We'll try to wean off. Left hip pain: Evaluated as outpatient by orthopedic surgery who found advanced DJD. Follow-up appointment was planned initially for  tomorrow. Suspect she will likely need left hip arthroplasty. The meantime, will try lidocaine patch Hypokalemia: We'll replace, check magnesium level   Code Status: full code   Family Communication: multiple family members the bedside   Disposition Plan: discharge once seen by vascular. Surgery, transitioned over to oral anticoagulation and tolerating. Clarify oxygenation status    Consultants:  Vascular surgery   Procedures:  Echocardiogram pending   Antimicrobials:  none   DVT prophylaxis:  IV heparin   Objective: Vitals:   03/01/18 0600 03/01/18 0615 03/01/18 0700 03/01/18 0748  BP: (!) 126/42 (!) 107/48 (!) 118/49 115/72  Pulse: 73 86 69 70  Resp: 10 19 16    Temp:    (!) 97.3 F (36.3 C)  TempSrc:    Oral  SpO2: 93% 90% 93% 98%  Weight:    77.8 kg (171 lb 8 oz)  Height:    5\' 8"  (1.727 m)    Intake/Output Summary (Last 24 hours) at 03/01/2018 1358 Last data filed at 03/01/2018 1200 Gross per 24 hour  Intake 1854.17 ml  Output -  Net 1854.17 ml   Filed Weights   02/28/18 1856 02/28/18 1914 03/01/18 0748  Weight: 77.1 kg (170 lb) 77.1 kg (170 lb) 77.8 kg (171 lb 8 oz)   Body mass index is 26.08 kg/m.  Exam:   General:  Alert and oriented 3, no acute distress  HEENT: Normocephalic and atraumatic, mucous membranes slightly dry  Neck: Supple, no JVD   Cardiovascular: regular rate and rhythm, S1-S2   Respiratory: clear to auscultation bilaterally   Abdomen: soft, mild tenderness throughout, subjective, hypoactive bowel sounds   Musculoskeletal: no clubbing or cyanosis, trace pitting edema. No pain with straight leg raising, however external obturation of left leg (severe  pain in left hip  Skin: no skin breaks, tears or lesions  Psychiatry: appropriate, no evidence of psychoses    Data Reviewed: CBC: Recent Labs  Lab 02/28/18 1924  WBC 10.7*  HGB 12.7  HCT 39.8  MCV 88.1  PLT 027   Basic Metabolic Panel: Recent Labs  Lab  02/28/18 1924 03/01/18 1204  NA 139 141  K 2.9* 3.0*  CL 98* 103  CO2 28 30  GLUCOSE 97 106*  BUN 17 11  CREATININE 1.35* 0.89  CALCIUM 9.0 8.6*   GFR: Estimated Creatinine Clearance: 58.1 mL/min (by C-G formula based on SCr of 0.89 mg/dL). Liver Function Tests: Recent Labs  Lab 02/28/18 1924  AST 15  ALT 10*  ALKPHOS 85  BILITOT 0.3  PROT 6.4*  ALBUMIN 3.3*   Recent Labs  Lab 02/28/18 1924  LIPASE 20   No results for input(s): AMMONIA in the last 168 hours. Coagulation Profile: No results for input(s): INR, PROTIME in the last 168 hours. Cardiac Enzymes: Recent Labs  Lab 03/01/18 0102  TROPONINI <0.03   BNP (last 3 results) Recent Labs    08/17/17 1038 09/14/17 0921  PROBNP 612 503   HbA1C: No results for input(s): HGBA1C in the last 72 hours. CBG: No results for input(s): GLUCAP in the last 168 hours. Lipid Profile: No results for input(s): CHOL, HDL, LDLCALC, TRIG, CHOLHDL, LDLDIRECT in the last 72 hours. Thyroid Function Tests: No results for input(s): TSH, T4TOTAL, FREET4, T3FREE, THYROIDAB in the last 72 hours. Anemia Panel: No results for input(s): VITAMINB12, FOLATE, FERRITIN, TIBC, IRON, RETICCTPCT in the last 72 hours. Urine analysis:    Component Value Date/Time   COLORURINE YELLOW 02/28/2018 2119   APPEARANCEUR CLEAR 02/28/2018 2119   LABSPEC 1.012 02/28/2018 2119   PHURINE 5.0 02/28/2018 2119   GLUCOSEU NEGATIVE 02/28/2018 2119   HGBUR SMALL (A) 02/28/2018 2119   BILIRUBINUR NEGATIVE 02/28/2018 2119   [place] NEGATIVE 02/28/2018 2119   PROTEINUR NEGATIVE 02/28/2018 2119   UROBILINOGEN 1.0 03/30/2011 0901   NITRITE NEGATIVE 02/28/2018 2119   LEUKOCYTESUR TRACE (A) 02/28/2018 2119   Sepsis Labs: @LABRCNTIP (procalcitonin:4,lacticidven:4)  )No results found for this or any previous visit (from the past 240 hour(s)).    Studies: Ct Angio Chest Pe W/cm &/or Wo Cm  Result Date: 03/01/2018 CLINICAL DATA:  Subacute onset of  nausea. Generalized abdominal pain. Mild generalized chest pain and shortness of breath. EXAM: CT ANGIOGRAPHY CHEST CT ABDOMEN AND PELVIS WITH CONTRAST TECHNIQUE: Multidetector CT imaging of the chest was performed using the standard protocol during bolus administration of intravenous contrast. Multiplanar CT image reconstructions and MIPs were obtained to evaluate the vascular anatomy. Multidetector CT imaging of the abdomen and pelvis was performed using the standard protocol during bolus administration of intravenous contrast. CONTRAST:  80 mL of Isovue 370 IV contrast COMPARISON:  CT of the abdomen and pelvis performed 11/08/2017, and CT of the thoracic spine performed 07/26/2014 FINDINGS: CTA CHEST FINDINGS Cardiovascular: There appears to be subacute or chronic pulmonary embolus within the pulmonary artery to the left lower lobe, extending minimally into segmental branches. No additional pulmonary embolus is identified. The RV/LV ratio of 1.1 may reflect right heart strain. Diffuse coronary artery calcifications are seen. Diffuse calcification is noted along the aortic arch and proximal great vessels. The heart remains normal in size. The patient is status post median sternotomy. There is a chronic fracture of the inferior-most sternal wire. Mediastinum/Nodes: The mediastinum is otherwise unremarkable in appearance. No mediastinal lymphadenopathy is  seen. No pericardial effusion is identified. The thyroid gland is grossly unremarkable. No axillary lymphadenopathy is seen. Lungs/Pleura: Minimal bilateral atelectasis is noted. The lungs are otherwise clear. Mild scarring is noted at the lung apices. No significant focal consolidation, pleural effusion or pneumothorax is seen. No masses are identified. Musculoskeletal: No acute osseous abnormalities are identified. The patient's right shoulder arthroplasty is incompletely imaged but appears grossly unremarkable. The visualized musculature is unremarkable in  appearance. Diffuse calcification is noted about the patient's bilateral breast implants. Review of the MIP images confirms the above findings. CT ABDOMEN and PELVIS FINDINGS Hepatobiliary: The liver is unremarkable in appearance. The patient is status post cholecystectomy, with clips noted at the gallbladder fossa. There is corresponding distention of the intrahepatic biliary ducts and common bile duct. Pancreas: The pancreas is atrophic and grossly unremarkable in appearance. Spleen: The spleen is unremarkable in appearance. Adrenals/Urinary Tract: Minimal calcification is noted at the left adrenal gland, likely adjacent to a small underlying adrenal lesion. The right adrenal gland is unremarkable. A right renal cyst is again noted, better characterized on the prior study. The kidneys are otherwise unremarkable. There is no evidence of hydronephrosis. No perinephric stranding is seen. Stomach/Bowel: The stomach is unremarkable in appearance. The small bowel is within normal limits. The appendix is normal in caliber, without evidence of appendicitis. The colon is unremarkable in appearance. Vascular/Lymphatic: Diffuse calcification is seen along the abdominal aorta and its branches, including along the right renal artery and superior mesenteric artery. There is likely relatively severe luminal narrowing at the proximal superior mesenteric artery. The abdominal aorta is otherwise grossly unremarkable. The inferior vena cava is grossly unremarkable. No retroperitoneal lymphadenopathy is seen. No pelvic sidewall lymphadenopathy is identified. Reproductive: The bladder is mildly distended and grossly unremarkable. The uterus is unremarkable in appearance. The ovaries are grossly symmetric. No suspicious adnexal masses are seen. Other: No additional soft tissue abnormalities are seen. Musculoskeletal: No acute osseous abnormalities are identified. The patient's right hip arthroplasty is incompletely imaged, but appears  grossly unremarkable. The patient is status post lumbar spinal fusion at L1-L3, with underlying decompression from L1-L4. The visualized musculature is unremarkable in appearance. Review of the MIP images confirms the above findings. IMPRESSION: 1. Apparent subacute or chronic pulmonary embolus within the pulmonary artery to the left lower lung lobe, extending minimally into segmental branches. RV/LV ratio of 1.1 may reflect right heart strain. Would correlate with the patient's symptoms. 2. Minimal bilateral atelectasis noted. Mild scarring at the lung apices. 3. Diffuse coronary artery calcifications seen. 4. Diffuse aortic atherosclerosis, with diffuse calcification along the right renal artery and superior mesenteric artery. Likely relatively severe luminal narrowing at the proximal superior mesenteric artery. Would correlate for any associated symptoms. Critical Value/emergent results were called by telephone at the time of interpretation on 03/01/2018 at 1:00 am to the ER clinical team, who verbally acknowledged these results. Electronically Signed   By: Garald Balding M.D.   On: 03/01/2018 01:03   Ct Abdomen Pelvis W Contrast  Result Date: 03/01/2018 CLINICAL DATA:  Subacute onset of nausea. Generalized abdominal pain. Mild generalized chest pain and shortness of breath. EXAM: CT ANGIOGRAPHY CHEST CT ABDOMEN AND PELVIS WITH CONTRAST TECHNIQUE: Multidetector CT imaging of the chest was performed using the standard protocol during bolus administration of intravenous contrast. Multiplanar CT image reconstructions and MIPs were obtained to evaluate the vascular anatomy. Multidetector CT imaging of the abdomen and pelvis was performed using the standard protocol during bolus administration of intravenous contrast.  CONTRAST:  80 mL of Isovue 370 IV contrast COMPARISON:  CT of the abdomen and pelvis performed 11/08/2017, and CT of the thoracic spine performed 07/26/2014 FINDINGS: CTA CHEST FINDINGS  Cardiovascular: There appears to be subacute or chronic pulmonary embolus within the pulmonary artery to the left lower lobe, extending minimally into segmental branches. No additional pulmonary embolus is identified. The RV/LV ratio of 1.1 may reflect right heart strain. Diffuse coronary artery calcifications are seen. Diffuse calcification is noted along the aortic arch and proximal great vessels. The heart remains normal in size. The patient is status post median sternotomy. There is a chronic fracture of the inferior-most sternal wire. Mediastinum/Nodes: The mediastinum is otherwise unremarkable in appearance. No mediastinal lymphadenopathy is seen. No pericardial effusion is identified. The thyroid gland is grossly unremarkable. No axillary lymphadenopathy is seen. Lungs/Pleura: Minimal bilateral atelectasis is noted. The lungs are otherwise clear. Mild scarring is noted at the lung apices. No significant focal consolidation, pleural effusion or pneumothorax is seen. No masses are identified. Musculoskeletal: No acute osseous abnormalities are identified. The patient's right shoulder arthroplasty is incompletely imaged but appears grossly unremarkable. The visualized musculature is unremarkable in appearance. Diffuse calcification is noted about the patient's bilateral breast implants. Review of the MIP images confirms the above findings. CT ABDOMEN and PELVIS FINDINGS Hepatobiliary: The liver is unremarkable in appearance. The patient is status post cholecystectomy, with clips noted at the gallbladder fossa. There is corresponding distention of the intrahepatic biliary ducts and common bile duct. Pancreas: The pancreas is atrophic and grossly unremarkable in appearance. Spleen: The spleen is unremarkable in appearance. Adrenals/Urinary Tract: Minimal calcification is noted at the left adrenal gland, likely adjacent to a small underlying adrenal lesion. The right adrenal gland is unremarkable. A right renal  cyst is again noted, better characterized on the prior study. The kidneys are otherwise unremarkable. There is no evidence of hydronephrosis. No perinephric stranding is seen. Stomach/Bowel: The stomach is unremarkable in appearance. The small bowel is within normal limits. The appendix is normal in caliber, without evidence of appendicitis. The colon is unremarkable in appearance. Vascular/Lymphatic: Diffuse calcification is seen along the abdominal aorta and its branches, including along the right renal artery and superior mesenteric artery. There is likely relatively severe luminal narrowing at the proximal superior mesenteric artery. The abdominal aorta is otherwise grossly unremarkable. The inferior vena cava is grossly unremarkable. No retroperitoneal lymphadenopathy is seen. No pelvic sidewall lymphadenopathy is identified. Reproductive: The bladder is mildly distended and grossly unremarkable. The uterus is unremarkable in appearance. The ovaries are grossly symmetric. No suspicious adnexal masses are seen. Other: No additional soft tissue abnormalities are seen. Musculoskeletal: No acute osseous abnormalities are identified. The patient's right hip arthroplasty is incompletely imaged, but appears grossly unremarkable. The patient is status post lumbar spinal fusion at L1-L3, with underlying decompression from L1-L4. The visualized musculature is unremarkable in appearance. Review of the MIP images confirms the above findings. IMPRESSION: 1. Apparent subacute or chronic pulmonary embolus within the pulmonary artery to the left lower lung lobe, extending minimally into segmental branches. RV/LV ratio of 1.1 may reflect right heart strain. Would correlate with the patient's symptoms. 2. Minimal bilateral atelectasis noted. Mild scarring at the lung apices. 3. Diffuse coronary artery calcifications seen. 4. Diffuse aortic atherosclerosis, with diffuse calcification along the right renal artery and superior  mesenteric artery. Likely relatively severe luminal narrowing at the proximal superior mesenteric artery. Would correlate for any associated symptoms. Critical Value/emergent results were called by telephone  at the time of interpretation on 03/01/2018 at 1:00 am to the ER clinical team, who verbally acknowledged these results. Electronically Signed   By: Garald Balding M.D.   On: 03/01/2018 01:03    Scheduled Meds: . allopurinol  100 mg Oral Daily  . cholecalciferol  1,000 Units Oral Daily  . citalopram  20 mg Oral TID  . feeding supplement (ENSURE ENLIVE)  237 mL Oral BID BM  . ferrous sulfate  325 mg Oral Q breakfast  . lidocaine  1 patch Transdermal Q24H  . pantoprazole  80 mg Oral Daily  . pilocarpine  1 drop Both Eyes BID  . potassium chloride SA  20 mEq Oral Daily  . pramipexole  0.25 mg Oral QHS  . roflumilast  500 mcg Oral Daily  . traZODone  150 mg Oral QHS  . umeclidinium bromide  1 puff Inhalation Daily    Continuous Infusions: . heparin 1,250 Units/hr (03/01/18 0252)     LOS: 0 days     Annita Brod, MD [facility]  To reach me or the doctor on call, go to: [email or web address] Password [email or web address]  03/01/2018, 1:58 PM

## 2018-03-01 NOTE — Progress Notes (Signed)
ANTICOAGULATION CONSULT NOTE - Initial Consult  Pharmacy Consult for heparin Indication: pulmonary embolus  Allergies  Allergen Reactions  . Zolpidem Tartrate Anxiety    CONFUSION   . Pitavastatin Other (See Comments)  . Ropinirole Other (See Comments)    Makes legs jump  . Penicillins Rash    Patient Measurements: Height: 5\' 8"  (172.7 cm) Weight: 170 lb (77.1 kg) IBW/kg (Calculated) : 63.9 Heparin Dosing Weight: 77 kg  Vital Signs: Temp: 98.6 F (37 C) (03/10 1914) Temp Source: Oral (03/10 1914) BP: 139/55 (03/11 0030) Pulse Rate: 75 (03/11 0030)  Labs: Recent Labs    02/28/18 1924  HGB 12.7  HCT 39.8  PLT 248  CREATININE 1.35*    Assessment: 77 yo female with hx of DVT who was on Xarelto - this was stopped due to prior GI bleed. Now admitted with chest pain. CTA shows acute vs chronic PE with R heart strain. Starting heparin gtt until long term plan for anticoagulation is decided. CBC ok.   Goal of Therapy:  Heparin level 0.3-0.7 units/ml Monitor platelets by anticoagulation protocol: Yes    Plan:  -Heparin bolus 4600 x1 then 1250 units/hr -Daily HL, CBC -Level in 8 hours   Harvel Quale 03/01/2018,1:29 AM

## 2018-03-02 ENCOUNTER — Inpatient Hospital Stay (HOSPITAL_COMMUNITY): Payer: Medicare HMO

## 2018-03-02 ENCOUNTER — Encounter (HOSPITAL_COMMUNITY): Payer: Self-pay

## 2018-03-02 DIAGNOSIS — J9601 Acute respiratory failure with hypoxia: Secondary | ICD-10-CM

## 2018-03-02 LAB — CBC
HCT: 41.2 % (ref 36.0–46.0)
Hemoglobin: 12.7 g/dL (ref 12.0–15.0)
MCH: 27.6 pg (ref 26.0–34.0)
MCHC: 30.8 g/dL (ref 30.0–36.0)
MCV: 89.6 fL (ref 78.0–100.0)
Platelets: 198 10*3/uL (ref 150–400)
RBC: 4.6 MIL/uL (ref 3.87–5.11)
RDW: 14.8 % (ref 11.5–15.5)
WBC: 8.3 10*3/uL (ref 4.0–10.5)

## 2018-03-02 LAB — BASIC METABOLIC PANEL
Anion gap: 10 (ref 5–15)
BUN: 10 mg/dL (ref 6–20)
CO2: 27 mmol/L (ref 22–32)
Calcium: 8.9 mg/dL (ref 8.9–10.3)
Chloride: 105 mmol/L (ref 101–111)
Creatinine, Ser: 0.82 mg/dL (ref 0.44–1.00)
GFR calc Af Amer: >60 mL/min (ref >60)
GFR calc non Af Amer: >60 mL/min (ref >60)
Glucose, Bld: 89 mg/dL (ref 65–99)
Potassium: 3.7 mmol/L (ref 3.5–5.1)
Sodium: 142 mmol/L (ref 135–145)

## 2018-03-02 LAB — HEPARIN LEVEL (UNFRACTIONATED)
Heparin Unfractionated: 0.84 IU/mL — ABNORMAL HIGH (ref 0.30–0.70)
Heparin Unfractionated: 0.37 IU/mL (ref 0.30–0.70)
Heparin Unfractionated: 0.42 IU/mL (ref 0.30–0.70)

## 2018-03-02 LAB — ECHOCARDIOGRAM COMPLETE
Height: 68 in
Weight: 2755.2 oz

## 2018-03-02 MED ORDER — BOOST / RESOURCE BREEZE PO LIQD CUSTOM
1.0000 | Freq: Three times a day (TID) | ORAL | Status: DC
  Administered 2018-03-02 – 2018-03-13 (×16): 1 via ORAL

## 2018-03-02 MED ORDER — PROMETHAZINE HCL 25 MG/ML IJ SOLN
6.2500 mg | Freq: Four times a day (QID) | INTRAMUSCULAR | Status: DC | PRN
  Administered 2018-03-03 – 2018-03-08 (×5): 6.25 mg via INTRAVENOUS
  Filled 2018-03-02 (×5): qty 1

## 2018-03-02 NOTE — Evaluation (Signed)
Physical Therapy Evaluation Patient Details Name: [name] MRN: [id number] DOB: 08/29/1941 Today's Date: 03/02/2018   History of Present Illness  77 year old female with past medical history of R THA, B TKA, B TSA, back surgery, neck surgery, CAD, PVD and COPD who is had worsening left hip issues that have gotten to the point where she does not ambulate much over the past month and presented to the emergency room on 3/10 with complaints of nausea, weakness and dyspnea on exertion and found to be in acute respiratory failure with hypoxia. Workup discovered multiplesubacute pulmonary embolus on CT scan. 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  Clinical Impression  Pt admitted with above diagnosis. Pt currently with functional limitations due to the deficits listed below (see PT Problem List). Pt ambulated 130' with RW, no loss of balance. Ambulation distance limited by chronic L hip pain 2* OA. SaO2 93% on room air with ambulation. Pt appears to be near baseline with mobility. Pt will benefit from skilled PT during acute stay to increase their independence and safety with mobility to allow discharge to the venue listed below.       Follow Up Recommendations No PT follow up    Equipment Recommendations  None recommended by PT    Recommendations for Other Services       Precautions / Restrictions Precautions Precautions: None Precaution Comments: pt denies h/o falls in past 1 year Restrictions Weight Bearing Restrictions: No      Mobility  Bed Mobility               General bed mobility comments: up in recliner  Transfers Overall transfer level: Modified independent Equipment used: Rolling walker (2 wheeled) Transfers: Sit to/from Stand Sit to Stand: Supervision         General transfer comment: used armrests, no physical assist needed  Ambulation/Gait Ambulation/Gait assistance: Supervision Ambulation Distance (Feet): 130  Feet Assistive device: Rolling walker (2 wheeled) Gait Pattern/deviations: Step-through pattern;Decreased stride length   Gait velocity interpretation: at or above normal speed for age/gender General Gait Details: L hip pain limits distance, this is baseline 2* advanced OA L hip per pt, SaO2 93% on room air walking, no loss of balance  Stairs            Wheelchair Mobility    Modified Rankin (Stroke Patients Only)       Balance Overall balance assessment: Modified Independent                                           Pertinent Vitals/Pain Pain Assessment: 0-10 Pain Score: 5  Pain Location: L hip (chronic OA pain per pt) Pain Descriptors / Indicators: Sore Pain Intervention(s): Limited activity within patient's tolerance;Monitored during session;Premedicated before session    Home Living Family/patient expects to be discharged to:: Private residence Living Arrangements: Alone Available Help at Discharge: Family;Available PRN/intermittently Type of Home: House Home Access: Stairs to enter Entrance Stairs-Rails: Right Entrance Stairs-Number of Steps: 3 Home Layout: One level Home Equipment: Walker - 4 wheels;Bedside commode;Shower seat      Prior Function Level of Independence: Independent with assistive device(s)         Comments: walks with rollator     Hand Dominance   Dominant Hand: Right    Extremity/Trunk Assessment   Upper Extremity Assessment Upper Extremity Assessment: LUE deficits/detail LUE Deficits /  Details: shoulder elevation ~50*, h/o failed reverse TSA    Lower Extremity Assessment Lower Extremity Assessment: Overall WFL for tasks assessed;LLE deficits/detail LLE Sensation: history of peripheral neuropathy    Cervical / Trunk Assessment Cervical / Trunk Assessment: Kyphotic  Communication   Communication: No difficulties  Cognition Arousal/Alertness: Awake/alert Behavior During Therapy: WFL for tasks  assessed/performed Overall Cognitive Status: Within Functional Limits for tasks assessed                                        General Comments      Exercises     Assessment/Plan    PT Assessment Patient needs continued PT services  PT Problem List Decreased activity tolerance;Decreased mobility       PT Treatment Interventions Gait training;Therapeutic activities;Therapeutic exercise;Patient/family education    PT Goals (Current goals can be found in the Care Plan section)  Acute Rehab PT Goals Patient Stated Goal: return to independence with mobility PT Goal Formulation: With patient/family Time For Goal Achievement: 03/16/18 Potential to Achieve Goals: Good    Frequency Min 3X/week   Barriers to discharge        Co-evaluation               AM-PAC PT "6 Clicks" Daily Activity  Outcome Measure Difficulty turning over in bed (including adjusting bedclothes, sheets and blankets)?: A Little Difficulty moving from lying on back to sitting on the side of the bed? : A Little Difficulty sitting down on and standing up from a chair with arms (e.g., wheelchair, bedside commode, etc,.)?: A Little Help needed moving to and from a bed to chair (including a wheelchair)?: A Little Help needed walking in hospital room?: A Little Help needed climbing 3-5 steps with a railing? : A Lot 6 Click Score: 17    End of Session Equipment Utilized During Treatment: Gait belt Activity Tolerance: Patient tolerated treatment well Patient left: in chair;with call bell/phone within reach;with family/visitor present Nurse Communication: Mobility status PT Visit Diagnosis: Difficulty in walking, not elsewhere classified (R26.2)    Time: 1696-7893 PT Time Calculation (min) (ACUTE ONLY): 19 min   Charges:   PT Evaluation $PT Eval Low Complexity: 1 Low     PT G Codes:          Philomena Doheny 03/02/2018, 1:34 PM [phone]

## 2018-03-02 NOTE — Progress Notes (Signed)
Nutrition Follow-up  DOCUMENTATION CODES:   Not applicable  INTERVENTION:   -Increase Boost Breeze po TID, each supplement provides 250 kcal and 9 grams of protein  NUTRITION DIAGNOSIS:   Increased nutrient needs related to chronic illness as evidenced by estimated needs.  Ongoing  GOAL:   Patient will meet greater than or equal to 90% of their needs  Progressing  MONITOR:   PO intake, Supplement acceptance, Labs, Weight trends, Skin, I & O's  REASON FOR ASSESSMENT:   Consult Assessment of nutrition requirement/status  ASSESSMENT:   [name] is a 77 y.o. female with medical history significant of CAD s/p CABG 6578, diastolic CHF with EF 46%, COPD currently on PRN O2 at home, prior DVT not currently anticoagulated due to GIB last year.  Patient presents to the ED with c/o 2-3 weeks of nausea, generalized weakness, SOB on exertion, needing to use more O2 at home on exertion than baseline.  RD re-consulted for assessment of nutritional needs and status.   RD fully assessed pt yesterday; please refer to RD note dated 03/01/18 for further details.   Pt in with case management at time of visit. 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 Intake has been minimal per RN, consuming bites of applesauce (meal completion 25-100%). Pt does like Boost Breeze supplements and has been consuming them.   Labs reviewed.   Diet Order:  Diet Heart Room service appropriate? Yes; Fluid consistency: Thin  EDUCATION NEEDS:   Education needs have been addressed  Skin:  Skin Assessment: Reviewed RN Assessment  Last BM:  03/02/18  Height:   Ht Readings from Last 1 Encounters:  03/01/18 5\' 8"  (1.727 m)    Weight:   Wt Readings from Last 1 Encounters:  03/02/18 172 lb 3.2 oz (78.1 kg)    Ideal Body Weight:  63.6 kg  BMI:  Body mass index is 26.18 kg/m.  Estimated Nutritional Needs:    Kcal:  1700-1900  Protein:  85-100 grams  Fluid:  1.7-1.9 L    Sibley Rolison A. Jimmye Norman, RD, LDN, CDE Pager: [phone] After hours Pager: [phone]

## 2018-03-02 NOTE — Progress Notes (Signed)
[facility] for Heparin Indication: pulmonary embolus  Allergies  Allergen Reactions  . Zolpidem Tartrate Anxiety    CONFUSION   . Pitavastatin Other (See Comments)    Not sure  . Ropinirole Nausea Only    Makes legs jump  . Penicillins Rash    Patient Measurements: Height: 5\' 8"  (172.7 cm) Weight: 172 lb 3.2 oz (78.1 kg)(a scale) IBW/kg (Calculated) : 63.9 Heparin Dosing Weight: 77 kg  Vital Signs: Temp: 97.9 F (36.6 C) (03/12 0734) Temp Source: Oral (03/12 0734) BP: 169/47 (03/12 1031) Pulse Rate: 86 (03/12 1031)  Labs: Recent Labs    02/28/18 1924 03/01/18 0102  03/01/18 1204 03/01/18 1903 03/02/18 0413 03/02/18 1403  HGB 12.7  --   --   --   --  12.7  --   HCT 39.8  --   --   --   --  41.2  --   PLT 248  --   --   --   --  198  --   HEPARINUNFRC  --   --    < >  --  0.80* 0.84* 0.37  CREATININE 1.35*  --   --  0.89  --  0.82  --   TROPONINI  --  <0.03  --   --   --   --   --    < > = values in this interval not displayed.    Estimated Creatinine Clearance: 63.1 mL/min (by C-G formula based on SCr of 0.82 mg/dL).   Medications:  Infusions:  . heparin 1,000 Units/hr (03/02/18 0546)    Assessment: 77 year old female has a history of DVT treated with Xarelto that was stopped due to prior GI bleed now admitted with PE/DVT. Patient was not on any anticoagulants prior to this admission. Pharmacy has been consulted to dose IV Heparin. Plans noted for mesenteric angio on Friday 03/05/18. -heparin level is at goal on 1000 units/hr   Goal of Therapy:  Heparin level 0.3-0.7 units/ml Monitor platelets by anticoagulation protocol: Yes   Plan:  No heparin changes needed Heparin level in 6 hours and daily wth CBC daily Daily Heparin level and CBC while on therapy.   Hildred Laser, PharmD Clinical Pharmacist Clinical phone from 8:30-4:00 is [phone] After 4pm, please call Main Rx [phone]) for assistance. 03/02/2018 3:31  PM

## 2018-03-02 NOTE — Progress Notes (Signed)
ANTICOAGULATION CONSULT NOTE  Pharmacy Consult:  Heparin Indication: pulmonary embolus  Allergies  Allergen Reactions  . Zolpidem Tartrate Anxiety    CONFUSION   . Pitavastatin Other (See Comments)    Not sure  . Ropinirole Nausea Only    Makes legs jump  . Penicillins Rash    Patient Measurements: Height: 5\' 8"  (172.7 cm) Weight: 172 lb 3.2 oz (78.1 kg)(a scale) IBW/kg (Calculated) : 63.9 Heparin Dosing Weight: 77 kg  Vital Signs: Temp: 97.8 F (36.6 C) (03/12 2141) Temp Source: Oral (03/12 2141) BP: 162/61 (03/12 2141) Pulse Rate: 85 (03/12 2141)  Labs: Recent Labs    02/28/18 1924 03/01/18 0102  03/01/18 1204  03/02/18 0413 03/02/18 1403 03/02/18 2021  HGB 12.7  --   --   --   --  12.7  --   --   HCT 39.8  --   --   --   --  41.2  --   --   PLT 248  --   --   --   --  198  --   --   HEPARINUNFRC  --   --    < >  --    < > 0.84* 0.37 0.42  CREATININE 1.35*  --   --  0.89  --  0.82  --   --   TROPONINI  --  <0.03  --   --   --   --   --   --    < > = values in this interval not displayed.    Estimated Creatinine Clearance: 63.1 mL/min (by C-G formula based on SCr of 0.82 mg/dL).   Assessment: 68 YOF with a history of DVT treated with Xarelto that was stopped due to prior GI bleed.  Patient now admitted with PE/DVT and Pharmacy has been consulted to dose IV heparin.   Confirmatory heparin level is therapeutic; no bleeding reported.   Goal of Therapy:  Heparin level 0.3-0.7 units/ml Monitor platelets by anticoagulation protocol: Yes    Plan:  Continue heparin gtt at 1000 units/hr F/U AM labs   Lamiya Naas D. Mina Marble, PharmD, BCPS Pager:  [phone] 03/02/2018, 9:46 PM

## 2018-03-02 NOTE — Progress Notes (Signed)
[facility] for Heparin Indication: pulmonary embolus  Allergies  Allergen Reactions  . Zolpidem Tartrate Anxiety    CONFUSION   . Pitavastatin Other (See Comments)    Not sure  . Ropinirole Nausea Only    Makes legs jump  . Penicillins Rash    Patient Measurements: Height: 5\' 8"  (172.7 cm) Weight: 172 lb 3.2 oz (78.1 kg)(a scale) IBW/kg (Calculated) : 63.9 Heparin Dosing Weight: 77 kg  Vital Signs: Temp: 98 F (36.7 C) (03/12 0102) Temp Source: Oral (03/12 0102) BP: 121/54 (03/12 0102) Pulse Rate: 76 (03/12 0102)  Labs: Recent Labs    02/28/18 1924 03/01/18 0102 03/01/18 0923 03/01/18 1204 03/01/18 1903 03/02/18 0413  HGB 12.7  --   --   --   --   --   HCT 39.8  --   --   --   --   --   PLT 248  --   --   --   --   --   HEPARINUNFRC  --   --  0.70  --  0.80* 0.84*  CREATININE 1.35*  --   --  0.89  --   --   TROPONINI  --  <0.03  --   --   --   --     Estimated Creatinine Clearance: 58.2 mL/min (by C-G formula based on SCr of 0.89 mg/dL).   Medications:  Infusions:  . heparin 1,150 Units/hr (03/01/18 1948)    Assessment: 77 year old female has a history of DVT treated with Xarelto that was stopped due to prior GI bleed now admitted with acute versus chronic pulmonary embolism per CT-Angio. Patient was not on any anticoagulants prior to this admission. Pharmacy has been consulted to dose IV Heparin for pulmonary embolism.   Repeat heparin level came back supra-therapeutic. CBC is within normal limits as of this morning. LE dopplers show at positive left lower extremity acute DVT involving the left femoral vein. No signs/symptoms of bleeding per nursing. No infusion issues.  RN reports some bleeding from hand. The IV was previously in her hand, when the bleeding occurred the IV was moved to her forearm.  Goal of Therapy:  Heparin level 0.3-0.7 units/ml Monitor platelets by anticoagulation protocol: Yes   Plan:  Reduce  heparin to 1000 units/hr  Repeat Heparin level in 8 hours  Daily Heparin level and CBC while on therapy.  Follow-up plan for oral anticoagulation   Harvel Quale 03/02/2018 5:41 AM

## 2018-03-02 NOTE — Progress Notes (Signed)
PROGRESS NOTE  [name] KDX:[id number] DOB: 1941-09-23 DOA: 02/28/2018 PCP: [name], MD  HPI/Recap of past 68 hours: 77 year old female with past medical history of CAD, PVD and COPD who is had worsening left hip issues that have gotten to the point where she does not ambulate much over the past month and presented to the emergency room on 3/10 with complaints of nausea, weakness and dyspnea on exertion and found to be in acute respiratory failure with hypoxia. Workup discovered multiplesubacute pulmonary embolus on CT scan as well as a large acute DVT in her left femoral. 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 Patient started on heparin and admitted to the hospitalist service.  Patient continues to complain of ongoing nausea, abdominal discomfort and left hip pain.  Started on lidocaine patch in the evening of 3/11, but still not much relief.  Seen by vascular surgery in regards to her questionable mesenteric artery stenosis and plans are for abdominal angiogram on Friday, 3/15.  Assessment/Plan: Principal Problem:   Pulmonary embolism (HCC)secondary to acute DVT: Suspect etiology is because patient has been minimally ambulatory secondary to surgery issues. On IV heparin. She previously had a DVT 15 years ago secondary to immobilization following surgery. At that time on Coumadin. Try xarelto (start after angiogram as per vascular surgery) no evidence of heart strain seen on echocardiogram.   Active Problems:   Hypothyroidism:continue Synthroid   Essential hypertension: Stable   COPD (chronic obstructive pulmonary disease) (HCC): Stable   CAD (coronary artery disease)   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[place]): For angiogram as per vascular surgery on Friday 3/15. Likely cause of patient's abdominal pain and persistent nausea   Acute on chronic respiratory failure with hypoxia (HCC)c: secondary to pulmonary embolus. Supplemental oxygen.  Working on  weaning off Left hip pain: Evaluated as outpatient by orthopedic surgery who found advanced DJD. Follow-up appointment was planned initially for tomorrow. Suspect she will likely need left hip arthroplasty.  Started lidocaine patch.  Minimal improvement, and will give it 1 more day and if no improvement, consider adding additional lidocaine patch.   Hypokalemia: Replaced   Code Status: full code   Family Communication: Daughter at the bedside  Disposition Plan: Here at least until Friday for angiogram   Consultants:  Vascular surgery   Procedures:  Echocardiogram notes no evidence of systolic or diastolic dysfunction.  No evidence of right-sided failure  Antimicrobials:  none   DVT prophylaxis:  IV heparin   Objective: Vitals:   03/02/18 0734 03/02/18 0757 03/02/18 1031 03/02/18 1328  BP: (!) 126/53  (!) 169/47   Pulse: 65  86   Resp:      Temp: 97.9 F (36.6 C)     TempSrc: Oral     SpO2: 98% 96% 97% 93%  Weight:      Height:        Intake/Output Summary (Last 24 hours) at 03/02/2018 1651 Last data filed at 03/02/2018 1257 Gross per 24 hour  Intake 1313.95 ml  Output 600 ml  Net 713.95 ml   Filed Weights   02/28/18 1914 03/01/18 0748 03/02/18 0439  Weight: 77.1 kg (170 lb) 77.8 kg (171 lb 8 oz) 78.1 kg (172 lb 3.2 oz)   Body mass index is 26.18 kg/m.  Exam:   General:  Alert and oriented 3, no acute distress  HEENT: Normocephalic and atraumatic, mucous membranes slightly dry  Neck: Supple, no JVD   Cardiovascular: regular rate and rhythm, S1-S2  Respiratory: clear to auscultation bilaterally   Abdomen: soft, mild tenderness throughout, subjective, hypoactive bowel sounds   Musculoskeletal: no clubbing or cyanosis, trace pitting edema. No pain with straight leg raising, however external obturation of left leg (severe pain in left hip)-checked on 3/11  Skin: no skin breaks, tears or lesions  Psychiatry: appropriate, no evidence of psychoses     Data Reviewed: CBC: Recent Labs  Lab 02/28/18 1924 03/02/18 0413  WBC 10.7* 8.3  HGB 12.7 12.7  HCT 39.8 41.2  MCV 88.1 89.6  PLT 248 568   Basic Metabolic Panel: Recent Labs  Lab 02/28/18 1924 03/01/18 1204 03/02/18 0413  NA 139 141 142  K 2.9* 3.0* 3.7  CL 98* 103 105  CO2 28 30 27   GLUCOSE 97 106* 89  BUN 17 11 10   CREATININE 1.35* 0.89 0.82  CALCIUM 9.0 8.6* 8.9  MG  --  1.5*  --    GFR: Estimated Creatinine Clearance: 63.1 mL/min (by C-G formula based on SCr of 0.82 mg/dL). Liver Function Tests: Recent Labs  Lab 02/28/18 1924  AST 15  ALT 10*  ALKPHOS 85  BILITOT 0.3  PROT 6.4*  ALBUMIN 3.3*   Recent Labs  Lab 02/28/18 1924  LIPASE 20   No results for input(s): AMMONIA in the last 168 hours. Coagulation Profile: No results for input(s): INR, PROTIME in the last 168 hours. Cardiac Enzymes: Recent Labs  Lab 03/01/18 0102  TROPONINI <0.03   BNP (last 3 results) Recent Labs    08/17/17 1038 09/14/17 0921  PROBNP 612 503   HbA1C: No results for input(s): HGBA1C in the last 72 hours. CBG: No results for input(s): GLUCAP in the last 168 hours. Lipid Profile: No results for input(s): CHOL, HDL, LDLCALC, TRIG, CHOLHDL, LDLDIRECT in the last 72 hours. Thyroid Function Tests: No results for input(s): TSH, T4TOTAL, FREET4, T3FREE, THYROIDAB in the last 72 hours. Anemia Panel: No results for input(s): VITAMINB12, FOLATE, FERRITIN, TIBC, IRON, RETICCTPCT in the last 72 hours. Urine analysis:    Component Value Date/Time   COLORURINE YELLOW 02/28/2018 2119   APPEARANCEUR CLEAR 02/28/2018 2119   LABSPEC 1.012 02/28/2018 2119   PHURINE 5.0 02/28/2018 2119   GLUCOSEU NEGATIVE 02/28/2018 2119   HGBUR SMALL (A) 02/28/2018 2119   BILIRUBINUR NEGATIVE 02/28/2018 2119   [place] NEGATIVE 02/28/2018 2119   PROTEINUR NEGATIVE 02/28/2018 2119   UROBILINOGEN 1.0 03/30/2011 0901   NITRITE NEGATIVE 02/28/2018 2119   LEUKOCYTESUR TRACE (A)  02/28/2018 2119   Sepsis Labs: @LABRCNTIP (procalcitonin:4,lacticidven:4)  )No results found for this or any previous visit (from the past 240 hour(s)).    Studies: No results found.  Scheduled Meds: . allopurinol  100 mg Oral Daily  . cholecalciferol  1,000 Units Oral Daily  . citalopram  20 mg Oral TID  . feeding supplement  1 Container Oral TID BM  . ferrous sulfate  325 mg Oral Q breakfast  . lidocaine  1 patch Transdermal Q24H  . pantoprazole  80 mg Oral Daily  . pilocarpine  1 drop Both Eyes BID  . potassium chloride SA  20 mEq Oral Daily  . pramipexole  0.25 mg Oral QHS  . roflumilast  500 mcg Oral Daily  . traZODone  150 mg Oral QHS  . umeclidinium bromide  1 puff Inhalation Daily    Continuous Infusions: . heparin 1,000 Units/hr (03/02/18 0546)     LOS: 1 day     Annita Brod, MD [facility]  To reach me  or the doctor on call, go to: [email or web address] Password [email or web address]  03/02/2018, 4:51 PM

## 2018-03-02 NOTE — Progress Notes (Addendum)
Benefit check in progress for Eliquis / Debbra Riding [phone]   RE: Benefit check  Received: Today  Message Contents  Lauris Chroman, RN        # 5.  S/W VINCENT @ [place] # [phone]    1. XARELTO 15 MG BID  COVER- YES  CO-PAY- $ 45.00  TIER- 3 DRUG  PRIOR APPROVAL- NO   2. XARELYO 20 MG DAILY  COVER- YES  CO-PAY- $ 45.00  TIER- 3 DRUG  PRIOR APPROVAL- NO   3. ELIQUIS 2.5 MG BID  COVER- YES  CO-PAY- $ 45.00  TIER- 3 DRUG  PRIOR APPROVAL- NO   4. ELIQUIS 5 MG BID  COVER- YES  CO-PAY- $ 45.00  TIER- 3 DRUG  PRIOR APPROVAL- NO   DEDUCTIBLE : MET   PREFERRED PHARMACY : [facility], [facility] AND [facility]

## 2018-03-02 NOTE — Consult Note (Signed)
   [facility] CM Inpatient Consult   03/02/2018  [name] [birth year] [id number]  Patient is currently active with [facility] for chronic disease management services with RN  Nye.   Met with the patient and her sister at the bedside. Patient gave permission to speak in front of her sister.  Patient endorse ongoing [facility].    Patient signed consent form and folder with [facility] information. Patient states she is having difficulty affording her medications.  She states, "I am falling into the donut, I have limited finances and my medications are costing me too much."   Primary Care Provider: Vassie Loll Eyk  Plan:  [facility] Pharmacist to assess   Patient states she is having surgery Friday.  Our community based plan of care has focused on disease management and community resource support.  Patient will receive a post discharge follow up and will be evaluated for monthly home visits for assessments and disease process education.  Made Inpatient Case Manager aware that [facility] following. Of note, [facility] Care Management services does not replace or interfere with any services that are needed or arranged by inpatient case management or social work.  For additional questions or referrals please contact:  Natividad Brood, RN BSN [facility] Liaison  [phone] business mobile phone Toll free office [phone]

## 2018-03-03 LAB — HEPARIN LEVEL (UNFRACTIONATED): Heparin Unfractionated: 0.32 IU/mL (ref 0.30–0.70)

## 2018-03-03 LAB — CBC
HCT: 37.4 % (ref 36.0–46.0)
Hemoglobin: 11.7 g/dL — ABNORMAL LOW (ref 12.0–15.0)
MCH: 27.8 pg (ref 26.0–34.0)
MCHC: 31.3 g/dL (ref 30.0–36.0)
MCV: 88.8 fL (ref 78.0–100.0)
Platelets: 188 10*3/uL (ref 150–400)
RBC: 4.21 MIL/uL (ref 3.87–5.11)
RDW: 15 % (ref 11.5–15.5)
WBC: 8 10*3/uL (ref 4.0–10.5)

## 2018-03-03 NOTE — Plan of Care (Signed)
  Health Behavior/Discharge Planning: Ability to manage health-related needs will improve 03/03/2018 2226 - Progressing by Tristan Schroeder, RN   Education: Knowledge of General Education information will improve 03/03/2018 2226 - Progressing by Tristan Schroeder, RN   Nutrition: Adequate nutrition will be maintained 03/03/2018 2226 - Progressing by Tristan Schroeder, RN   Pain Managment: General experience of comfort will improve 03/03/2018 2226 - Progressing by Tristan Schroeder, RN

## 2018-03-03 NOTE — Progress Notes (Signed)
ANTICOAGULATION CONSULT NOTE  Pharmacy Consult:  Heparin Indication: pulmonary embolus  Allergies  Allergen Reactions  . Zolpidem Tartrate Anxiety    CONFUSION   . Pitavastatin Other (See Comments)    Not sure  . Ropinirole Nausea Only    Makes legs jump  . Penicillins Rash    Patient Measurements: Height: 5\' 8"  (172.7 cm) Weight: 174 lb 14.4 oz (79.3 kg) IBW/kg (Calculated) : 63.9 Heparin Dosing Weight: 77 kg  Vital Signs: Temp: 98.2 F (36.8 C) (03/13 0429) Temp Source: Oral (03/13 0429) BP: 146/55 (03/13 0429) Pulse Rate: 82 (03/13 0429)  Labs: Recent Labs    02/28/18 1924 03/01/18 0102  03/01/18 1204  03/02/18 0413 03/02/18 1403 03/02/18 2021 03/03/18 0514  HGB 12.7  --   --   --   --  12.7  --   --  11.7*  HCT 39.8  --   --   --   --  41.2  --   --  37.4  PLT 248  --   --   --   --  198  --   --  188  HEPARINUNFRC  --   --    < >  --    < > 0.84* 0.37 0.42 0.32  CREATININE 1.35*  --   --  0.89  --  0.82  --   --   --   TROPONINI  --  <0.03  --   --   --   --   --   --   --    < > = values in this interval not displayed.    Estimated Creatinine Clearance: 63.6 mL/min (by C-G formula based on SCr of 0.82 mg/dL).   Assessment: 60 YOF with a history of DVT treated with Xarelto that was stopped due to prior GI bleed.  Patient now admitted with PE/DVT and Pharmacy has been consulted to dose IV heparin.   Heparin level remains therapeutic this AM however, trending down to low end of range. CBC is stable. No bleeding reported.    Goal of Therapy:  Heparin level 0.3-0.7 units/ml Monitor platelets by anticoagulation protocol: Yes    Plan:  Increase heparin gtt to 1050 units/hr to keep in range Follow-up daily heparin level and CBC   Sloan Leiter, PharmD, BCPS, BCCCP Clinical Pharmacist Clinical phone 03/03/2018 until 3:30PM- #[phone] After hours, please call [phone] 03/03/2018, 7:49 AM

## 2018-03-03 NOTE — Progress Notes (Signed)
PROGRESS NOTE    [name]  GGE:[id number] DOB: [birth year] DOA: 02/28/2018 PCP: [name], MD     Brief Narrative:  [name] is a 77 year old female with past medical history of CAD, PVD and COPD who is had worsening left hip issues that have gotten to the point where she has not ambulated much over the past month and presented to the emergency room on 3/10 with complaints of nausea, weakness and dyspnea on exertion and found to be in acute respiratory failure with hypoxia. Workup discovered multiple subacute pulmonary embolus on CT scan as well as a large acute DVT in her left femoral vein. 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 Patient started on heparin and admitted to the hospitalist service. She was evaluated by vascular surgery in regards to her questionable mesenteric artery stenosis and plans are for abdominal angiogram on Friday, 3/15.  Assessment & Plan:   Principal Problem:   Pulmonary embolism (HCC) Active Problems:   Hypothyroidism   Essential hypertension   COPD (chronic obstructive pulmonary disease) (HCC)   CAD (coronary artery disease)   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HCC)   Acute on chronic respiratory failure with hypoxia (HCC)   PE and left femoral DVT -Patient with hx of DVT post-operation 15 years ago. She was on coumadin at that time.  -Patient has not been ambulatory due to chronic hip pain -Echo without right heart strain  -Continue IV heparin. Plan to start xarelto after angiogram on Friday   Superior mesenteric artery stenosis -Vascular surgery consulted, plan for angiogram on Friday 3/15  Hypothyroidism -Continue synthroid  Essential hypertension -Stable  COPD -Stable without exacerbation  Left hip pain secondary to degenerative joint disease -Pain control, follow-up with orthopedic surgery as outpatient   DVT prophylaxis: Heparin gtt Code Status: Full Family Communication: No family at  bedside Disposition Plan: Pending angiogram 3/15   Consultants:   Vascular surgery  Procedures:   None   Antimicrobials:  Anti-infectives (From admission, onward)   None       Subjective: Patient continues to complain of nausea, without vomiting.  She has some vague epigastric abdominal pain as well.  Complains of pain all over including her hip, back.  Objective: Vitals:   03/02/18 2141 03/03/18 0002 03/03/18 0429 03/03/18 1203  BP: (!) 162/61 (!) 136/51 (!) 146/55 103/84  Pulse: 85 71 82 70  Resp: 18 18 18 20   Temp: 97.8 F (36.6 C) 98.2 F (36.8 C) 98.2 F (36.8 C) 97.7 F (36.5 C)  TempSrc: Oral Oral Oral Oral  SpO2: 95% 93% 93% 95%  Weight:   79.3 kg (174 lb 14.4 oz)   Height:        Intake/Output Summary (Last 24 hours) at 03/03/2018 1239 Last data filed at 03/03/2018 1110 Gross per 24 hour  Intake 2225.5 ml  Output 1150 ml  Net 1075.5 ml   Filed Weights   03/01/18 0748 03/02/18 0439 03/03/18 0429  Weight: 77.8 kg (171 lb 8 oz) 78.1 kg (172 lb 3.2 oz) 79.3 kg (174 lb 14.4 oz)    Examination:  General exam: Appears calm and comfortable  Respiratory system: Clear to auscultation. Respiratory effort normal. Cardiovascular system: S1 & S2 heard, RRR. No JVD, murmurs, rubs, gallops or clicks. No pedal edema. Gastrointestinal system: Abdomen is nondistended, soft and TTP epigastric without guarding. No organomegaly or masses felt. Normal bowel sounds heard. Central nervous system: Alert and oriented. No focal neurological deficits. Extremities: Symmetric 5  x 5 power. Skin: No rashes, lesions or ulcers Psychiatry: Judgement and insight appear normal. Mood & affect appropriate.   Data Reviewed: I have personally reviewed following labs and imaging studies  CBC: Recent Labs  Lab 02/28/18 1924 03/02/18 0413 03/03/18 0514  WBC 10.7* 8.3 8.0  HGB 12.7 12.7 11.7*  HCT 39.8 41.2 37.4  MCV 88.1 89.6 88.8  PLT 248 198 144   Basic Metabolic Panel: Recent  Labs  Lab 02/28/18 1924 03/01/18 1204 03/02/18 0413  NA 139 141 142  K 2.9* 3.0* 3.7  CL 98* 103 105  CO2 28 30 27   GLUCOSE 97 106* 89  BUN 17 11 10   CREATININE 1.35* 0.89 0.82  CALCIUM 9.0 8.6* 8.9  MG  --  1.5*  --    GFR: Estimated Creatinine Clearance: 63.6 mL/min (by C-G formula based on SCr of 0.82 mg/dL). Liver Function Tests: Recent Labs  Lab 02/28/18 1924  AST 15  ALT 10*  ALKPHOS 85  BILITOT 0.3  PROT 6.4*  ALBUMIN 3.3*   Recent Labs  Lab 02/28/18 1924  LIPASE 20   No results for input(s): AMMONIA in the last 168 hours. Coagulation Profile: No results for input(s): INR, PROTIME in the last 168 hours. Cardiac Enzymes: Recent Labs  Lab 03/01/18 0102  TROPONINI <0.03   BNP (last 3 results) Recent Labs    08/17/17 1038 09/14/17 0921  PROBNP 612 503   HbA1C: No results for input(s): HGBA1C in the last 72 hours. CBG: No results for input(s): GLUCAP in the last 168 hours. Lipid Profile: No results for input(s): CHOL, HDL, LDLCALC, TRIG, CHOLHDL, LDLDIRECT in the last 72 hours. Thyroid Function Tests: No results for input(s): TSH, T4TOTAL, FREET4, T3FREE, THYROIDAB in the last 72 hours. Anemia Panel: No results for input(s): VITAMINB12, FOLATE, FERRITIN, TIBC, IRON, RETICCTPCT in the last 72 hours. Sepsis Labs: No results for input(s): PROCALCITON, LATICACIDVEN in the last 168 hours.  No results found for this or any previous visit (from the past 240 hour(s)).     Radiology Studies: No results found.    Scheduled Meds: . allopurinol  100 mg Oral Daily  . cholecalciferol  1,000 Units Oral Daily  . citalopram  20 mg Oral TID  . feeding supplement  1 Container Oral TID BM  . ferrous sulfate  325 mg Oral Q breakfast  . lidocaine  1 patch Transdermal Q24H  . pantoprazole  80 mg Oral Daily  . pilocarpine  1 drop Both Eyes BID  . pramipexole  0.25 mg Oral QHS  . roflumilast  500 mcg Oral Daily  . traZODone  150 mg Oral QHS  . umeclidinium  bromide  1 puff Inhalation Daily   Continuous Infusions: . heparin 1,050 Units/hr (03/03/18 0910)     LOS: 2 days    Time spent: 20 minutes   Dessa Phi, DO [facility] [email or web address] Password [facility] 03/03/2018, 12:39 PM

## 2018-03-03 NOTE — Plan of Care (Signed)
  Health Behavior/Discharge Planning: Ability to manage health-related needs will improve 03/03/2018 0039 - Progressing by Tristan Schroeder, RN   Clinical Measurements: Ability to maintain clinical measurements within normal limits will improve 03/03/2018 0039 - Progressing by Tristan Schroeder, RN   Clinical Measurements: Respiratory complications will improve 03/03/2018 0039 - Progressing by Tristan Schroeder, RN

## 2018-03-04 LAB — CBC
HCT: 39 % (ref 36.0–46.0)
Hemoglobin: 12.2 g/dL (ref 12.0–15.0)
MCH: 27.4 pg (ref 26.0–34.0)
MCHC: 31.3 g/dL (ref 30.0–36.0)
MCV: 87.6 fL (ref 78.0–100.0)
Platelets: 180 10*3/uL (ref 150–400)
RBC: 4.45 MIL/uL (ref 3.87–5.11)
RDW: 14.7 % (ref 11.5–15.5)
WBC: 8.4 10*3/uL (ref 4.0–10.5)

## 2018-03-04 LAB — SURGICAL PCR SCREEN
MRSA, PCR: NEGATIVE
Staphylococcus aureus: NEGATIVE

## 2018-03-04 LAB — HEPARIN LEVEL (UNFRACTIONATED): Heparin Unfractionated: 0.33 IU/mL (ref 0.30–0.70)

## 2018-03-04 NOTE — Progress Notes (Signed)
PROGRESS NOTE    [name]  VPX:[id number] DOB: 10/06/1941 DOA: 02/28/2018 PCP: [name], MD     Brief Narrative:  [name] is a 77 year old female with past medical history of CAD, PVD and COPD who is had worsening left hip issues that have gotten to the point where she has not ambulated much over the past month and presented to the emergency room on 3/10 with complaints of nausea, weakness and dyspnea on exertion and found to be in acute respiratory failure with hypoxia. Workup discovered multiple subacute pulmonary embolus on CT scan as well as a large acute DVT in her left femoral vein. 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 Patient started on heparin and admitted to the hospitalist service. She was evaluated by vascular surgery in regards to her questionable mesenteric artery stenosis and plans are for abdominal angiogram on Friday, 3/15.  Assessment & Plan:   Principal Problem:   Pulmonary embolism (HCC) Active Problems:   Hypothyroidism   Essential hypertension   COPD (chronic obstructive pulmonary disease) (HCC)   CAD (coronary artery disease)   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HCC)   Acute on chronic respiratory failure with hypoxia (HCC)   PE and left femoral DVT -Patient with hx of DVT post-operation 15 years ago. She was on coumadin at that time.  -Patient has not been ambulatory due to chronic hip pain -Echo without right heart strain  -Continue IV heparin. Plan to start xarelto after angiogram on Friday   Superior mesenteric artery stenosis -Vascular surgery consulted, plan for angiogram on Friday 3/15  Hypothyroidism -Continue synthroid  Essential hypertension -Stable  COPD -Stable without exacerbation  Left hip pain secondary to degenerative joint disease -Pain control, follow-up with orthopedic surgery as outpatient   DVT prophylaxis: Heparin gtt Code Status: Full Family Communication: Family at  bedside Disposition Plan: Pending angiogram 3/15   Consultants:   Vascular surgery  Procedures:   None   Antimicrobials:  Anti-infectives (From admission, onward)   None       Subjective: Doing better this morning.  Still continues to have nausea, vomiting but was able to tolerate meals yesterday and this morning as well.  Appetite is better.  Denies any abdominal pain although has left lower extremity pain.  No new complaints.  Objective: Vitals:   03/03/18 1203 03/03/18 2013 03/04/18 0514 03/04/18 0852  BP: 103/84 (!) 139/44 (!) 137/48   Pulse: 70 84 97 88  Resp: 20 18 18 17   Temp: 97.7 F (36.5 C) 98.1 F (36.7 C) 99.1 F (37.3 C)   TempSrc: Oral Oral Oral   SpO2: 95% 94% 93% 93%  Weight:   79.2 kg (174 lb 8 oz)   Height:        Intake/Output Summary (Last 24 hours) at 03/04/2018 1250 Last data filed at 03/04/2018 1120 Gross per 24 hour  Intake 713.42 ml  Output 1000 ml  Net -286.58 ml   Filed Weights   03/02/18 0439 03/03/18 0429 03/04/18 0514  Weight: 78.1 kg (172 lb 3.2 oz) 79.3 kg (174 lb 14.4 oz) 79.2 kg (174 lb 8 oz)   Examination: General exam: Appears calm and comfortable  Respiratory system: Clear to auscultation. Respiratory effort normal. Cardiovascular system: S1 & S2 heard, RRR. No JVD, murmurs, rubs, gallops or clicks. No pedal edema. Gastrointestinal system: Abdomen is nondistended, soft and nontender. No organomegaly or masses felt. Normal bowel sounds heard. Central nervous system: Alert and oriented. No focal neurological  deficits. Extremities: Symmetric 5 x 5 power. Skin: No rashes, lesions or ulcers Psychiatry: Judgement and insight appear normal. Mood & affect appropriate.   Data Reviewed: I have personally reviewed following labs and imaging studies  CBC: Recent Labs  Lab 02/28/18 1924 03/02/18 0413 03/03/18 0514 03/04/18 0725  WBC 10.7* 8.3 8.0 8.4  HGB 12.7 12.7 11.7* 12.2  HCT 39.8 41.2 37.4 39.0  MCV 88.1 89.6 88.8 87.6   PLT 248 198 188 174   Basic Metabolic Panel: Recent Labs  Lab 02/28/18 1924 03/01/18 1204 03/02/18 0413  NA 139 141 142  K 2.9* 3.0* 3.7  CL 98* 103 105  CO2 28 30 27   GLUCOSE 97 106* 89  BUN 17 11 10   CREATININE 1.35* 0.89 0.82  CALCIUM 9.0 8.6* 8.9  MG  --  1.5*  --    GFR: Estimated Creatinine Clearance: 63.5 mL/min (by C-G formula based on SCr of 0.82 mg/dL). Liver Function Tests: Recent Labs  Lab 02/28/18 1924  AST 15  ALT 10*  ALKPHOS 85  BILITOT 0.3  PROT 6.4*  ALBUMIN 3.3*   Recent Labs  Lab 02/28/18 1924  LIPASE 20   No results for input(s): AMMONIA in the last 168 hours. Coagulation Profile: No results for input(s): INR, PROTIME in the last 168 hours. Cardiac Enzymes: Recent Labs  Lab 03/01/18 0102  TROPONINI <0.03   BNP (last 3 results) Recent Labs    08/17/17 1038 09/14/17 0921  PROBNP 612 503   HbA1C: No results for input(s): HGBA1C in the last 72 hours. CBG: No results for input(s): GLUCAP in the last 168 hours. Lipid Profile: No results for input(s): CHOL, HDL, LDLCALC, TRIG, CHOLHDL, LDLDIRECT in the last 72 hours. Thyroid Function Tests: No results for input(s): TSH, T4TOTAL, FREET4, T3FREE, THYROIDAB in the last 72 hours. Anemia Panel: No results for input(s): VITAMINB12, FOLATE, FERRITIN, TIBC, IRON, RETICCTPCT in the last 72 hours. Sepsis Labs: No results for input(s): PROCALCITON, LATICACIDVEN in the last 168 hours.  No results found for this or any previous visit (from the past 240 hour(s)).     Radiology Studies: No results found.    Scheduled Meds: . allopurinol  100 mg Oral Daily  . cholecalciferol  1,000 Units Oral Daily  . citalopram  20 mg Oral TID  . feeding supplement  1 Container Oral TID BM  . ferrous sulfate  325 mg Oral Q breakfast  . lidocaine  1 patch Transdermal Q24H  . pantoprazole  80 mg Oral Daily  . pilocarpine  1 drop Both Eyes BID  . pramipexole  0.25 mg Oral QHS  . roflumilast  500 mcg  Oral Daily  . traZODone  150 mg Oral QHS  . umeclidinium bromide  1 puff Inhalation Daily   Continuous Infusions: . heparin 1,050 Units/hr (03/03/18 1726)     LOS: 3 days    Time spent: 20 minutes   Dessa Phi, DO [facility] [email or web address] Password [facility] 03/04/2018, 12:50 PM

## 2018-03-04 NOTE — Progress Notes (Signed)
Benefit check for [name] [phone] RE: Benefit check  Received: Yesterday  Message Contents  Vivianne Spence CMA        Co-pay amount for Xarelto 15mg . $45.00  for 30 day supply.  90 day supply W/[employer] mail order $125.00   Eliquis 5mg  twice a day $45.00 30 for 60 day supply.  90 day supply W/[employer] mail order $125.00   No PA required  Preferred pharmacies,[facility]   Thank-you  XTAVW([id number])

## 2018-03-04 NOTE — Progress Notes (Signed)
Physical Therapy Treatment Patient Details Name: [name] MRN: [id number] DOB: 10-14-1941 Today's Date: 03/04/2018    History of Present Illness 77 year old female with past medical history of R THA, B TKA, B TSA, back surgery, neck surgery, CAD, PVD and COPD who is had worsening left hip issues that have gotten to the point where she does not ambulate much over the past month and presented to the emergency room on 3/10 with complaints of nausea, weakness and dyspnea on exertion and found to be in acute respiratory failure with hypoxia. Workup discovered multiplesubacute pulmonary embolus on CT scan. 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    PT Comments    Pt making good progress with mobility.    Follow Up Recommendations  No PT follow up     Equipment Recommendations  None recommended by PT    Recommendations for Other Services       Precautions / Restrictions Precautions Precautions: None Precaution Comments: pt denies h/o falls in past 1 year Restrictions Weight Bearing Restrictions: No    Mobility  Bed Mobility               General bed mobility comments: Up on EOB  Transfers Overall transfer level: Modified independent Equipment used: Rolling walker (2 wheeled) Transfers: Sit to/from Stand Sit to Stand: Modified independent (Device/Increase time)            Ambulation/Gait Ambulation/Gait assistance: Supervision Ambulation Distance (Feet): 175 Feet Assistive device: Rolling walker (2 wheeled) Gait Pattern/deviations: Step-through pattern;Decreased stride length;Antalgic;Trunk flexed     General Gait Details: Assist for safety. Pt limited by chronic hip pain.   Stairs            Wheelchair Mobility    Modified Rankin (Stroke Patients Only)       Balance Overall balance assessment: Needs assistance Sitting-balance support: No upper extremity supported;Feet supported Sitting balance-Leahy  Scale: Good     Standing balance support: No upper extremity supported;During functional activity Standing balance-Leahy Scale: Fair                              Cognition Arousal/Alertness: Awake/alert Behavior During Therapy: WFL for tasks assessed/performed Overall Cognitive Status: Within Functional Limits for tasks assessed                                        Exercises      General Comments        Pertinent Vitals/Pain Pain Assessment: Faces Faces Pain Scale: Hurts little more Pain Location: L hip (chronic OA pain per pt) Pain Descriptors / Indicators: Sore Pain Intervention(s): Limited activity within patient's tolerance;Monitored during session    Home Living                      Prior Function            PT Goals (current goals can now be found in the care plan section) Progress towards PT goals: Progressing toward goals    Frequency    Min 3X/week      PT Plan Current plan remains appropriate    Co-evaluation              AM-PAC PT "6 Clicks" Daily Activity  Outcome Measure  Difficulty turning over in bed (including adjusting bedclothes, sheets  and blankets)?: A Little Difficulty moving from lying on back to sitting on the side of the bed? : A Little Difficulty sitting down on and standing up from a chair with arms (e.g., wheelchair, bedside commode, etc,.)?: A Little Help needed moving to and from a bed to chair (including a wheelchair)?: A Little Help needed walking in hospital room?: A Little Help needed climbing 3-5 steps with a railing? : A Lot 6 Click Score: 17    End of Session Equipment Utilized During Treatment: Gait belt Activity Tolerance: Patient tolerated treatment well Patient left: in chair;with call bell/phone within reach Nurse Communication: Mobility status PT Visit Diagnosis: Difficulty in walking, not elsewhere classified (R26.2)     Time: 3710-6269 PT Time Calculation  (min) (ACUTE ONLY): 14 min  Charges:  $Gait Training: 8-22 mins                    G Codes:       [facility] PT [place] 03/04/2018, 3:08 PM

## 2018-03-04 NOTE — Progress Notes (Signed)
Mesenteric Angio tomorrow.  Will d/c heparin on call to cath lab tomorrow.  Ruta Hinds, MD [facility] [employer] [facility] Office: [phone] Pager: [phone]

## 2018-03-04 NOTE — Progress Notes (Signed)
ANTICOAGULATION CONSULT NOTE - Follow Up Consult  Pharmacy Consult for Heparin Indication: DVT  Allergies  Allergen Reactions  . Zolpidem Tartrate Anxiety    CONFUSION   . Pitavastatin Other (See Comments)    Not sure  . Ropinirole Nausea Only    Makes legs jump  . Penicillins Rash    Patient Measurements: Height: 5\' 8"  (172.7 cm) Weight: 174 lb 8 oz (79.2 kg) IBW/kg (Calculated) : 63.9 Heparin Dosing Weight:    Vital Signs: Temp: 99.1 F (37.3 C) (03/14 0514) Temp Source: Oral (03/14 0514) BP: 137/48 (03/14 0514) Pulse Rate: 88 (03/14 0852)  Labs: Recent Labs    03/01/18 1204  03/02/18 0413  03/02/18 2021 03/03/18 0514 03/04/18 0725  HGB  --    < > 12.7  --   --  11.7* 12.2  HCT  --   --  41.2  --   --  37.4 39.0  PLT  --   --  198  --   --  188 180  HEPARINUNFRC  --    < > 0.84*   < > 0.42 0.32 0.33  CREATININE 0.89  --  0.82  --   --   --   --    < > = values in this interval not displayed.    Estimated Creatinine Clearance: 63.5 mL/min (by C-G formula based on SCr of 0.82 mg/dL).   Assessment:  Anticoag: heparin for acute vs chronic PE with R heart strain and also w/ LLE DVT (and hx of DVTs on xarelto in the past but this was d/c due to a GIB in 01/2017). HL 0.33 in goal. CBC stable.  Goal of Therapy:  Heparin level 0.3-0.7 units/ml Monitor platelets by anticoagulation protocol: Yes   Plan:  Continue Heparin at 1050 units/hr. D/c on call to cath tomorrow. Daily HL, CBC Mesenteric angio on Friday 03/05/18 (checking for degree of stenosis) Plan to start xarelto after angiogram on Friday  Jasiah Elsen S. Alford Highland, PharmD, [facility] Clinical Staff Pharmacist Pager [place], [place] 03/04/2018,10:52 AM

## 2018-03-05 ENCOUNTER — Encounter (HOSPITAL_COMMUNITY)
Admission: EM | Disposition: A | Discharge status: Skilled Nursing Facility | Payer: Self-pay | Source: Home / Self Care | Attending: Internal Medicine

## 2018-03-05 HISTORY — PX: VISCERAL ANGIOGRAPHY: CATH118276

## 2018-03-05 HISTORY — PX: PERIPHERAL VASCULAR INTERVENTION: CATH118257

## 2018-03-05 LAB — CBC
HCT: 37.4 % (ref 36.0–46.0)
Hemoglobin: 11.8 g/dL — ABNORMAL LOW (ref 12.0–15.0)
MCH: 28 pg (ref 26.0–34.0)
MCHC: 31.6 g/dL (ref 30.0–36.0)
MCV: 88.6 fL (ref 78.0–100.0)
Platelets: 165 10*3/uL (ref 150–400)
RBC: 4.22 MIL/uL (ref 3.87–5.11)
RDW: 15.3 % (ref 11.5–15.5)
WBC: 8.1 10*3/uL (ref 4.0–10.5)

## 2018-03-05 LAB — BASIC METABOLIC PANEL
Anion gap: 7 (ref 5–15)
BUN: 9 mg/dL (ref 6–20)
CO2: 28 mmol/L (ref 22–32)
Calcium: 9.5 mg/dL (ref 8.9–10.3)
Chloride: 108 mmol/L (ref 101–111)
Creatinine, Ser: 0.81 mg/dL (ref 0.44–1.00)
GFR calc Af Amer: >60 mL/min (ref >60)
GFR calc non Af Amer: >60 mL/min (ref >60)
Glucose, Bld: 102 mg/dL — ABNORMAL HIGH (ref 65–99)
Potassium: 3.5 mmol/L (ref 3.5–5.1)
Sodium: 143 mmol/L (ref 135–145)

## 2018-03-05 LAB — HEPARIN LEVEL (UNFRACTIONATED): Heparin Unfractionated: 0.36 IU/mL (ref 0.30–0.70)

## 2018-03-05 LAB — POCT ACTIVATED CLOTTING TIME
Activated Clotting Time: 213 seconds
Activated Clotting Time: 186 seconds
Activated Clotting Time: 169 seconds

## 2018-03-05 SURGERY — VISCERAL ANGIOGRAPHY
Anesthesia: LOCAL

## 2018-03-05 MED ORDER — LABETALOL HCL 5 MG/ML IV SOLN
INTRAVENOUS | Status: AC
  Filled 2018-03-05: qty 4

## 2018-03-05 MED ORDER — HEPARIN SODIUM (PORCINE) 1000 UNIT/ML IJ SOLN
INTRAMUSCULAR | Status: DC | PRN
  Administered 2018-03-05: 3000 [IU] via INTRAVENOUS

## 2018-03-05 MED ORDER — OXYCODONE HCL 5 MG PO TABS
ORAL_TABLET | ORAL | Status: AC
  Filled 2018-03-05: qty 2

## 2018-03-05 MED ORDER — HEPARIN (PORCINE) IN NACL 2-0.9 UNIT/ML-% IJ SOLN
INTRAMUSCULAR | Status: AC | PRN
  Administered 2018-03-05 (×3): 500 mL via INTRA_ARTERIAL

## 2018-03-05 MED ORDER — SODIUM CHLORIDE 0.9% FLUSH
3.0000 mL | Freq: Two times a day (BID) | INTRAVENOUS | Status: DC
  Administered 2018-03-05 – 2018-03-25 (×24): 3 mL via INTRAVENOUS

## 2018-03-05 MED ORDER — HEPARIN SODIUM (PORCINE) 1000 UNIT/ML IJ SOLN
INTRAMUSCULAR | Status: AC
  Filled 2018-03-05: qty 1

## 2018-03-05 MED ORDER — FENTANYL CITRATE (PF) 100 MCG/2ML IJ SOLN
INTRAMUSCULAR | Status: DC | PRN
  Administered 2018-03-05 (×3): 25 ug via INTRAVENOUS

## 2018-03-05 MED ORDER — SODIUM CHLORIDE 0.9% FLUSH
3.0000 mL | INTRAVENOUS | Status: DC | PRN
  Administered 2018-03-18 – 2018-03-23 (×3): 3 mL via INTRAVENOUS
  Filled 2018-03-05 (×3): qty 3

## 2018-03-05 MED ORDER — FENTANYL CITRATE (PF) 100 MCG/2ML IJ SOLN
INTRAMUSCULAR | Status: AC
  Filled 2018-03-05: qty 2

## 2018-03-05 MED ORDER — HYDRALAZINE HCL 20 MG/ML IJ SOLN
INTRAMUSCULAR | Status: AC
  Filled 2018-03-05: qty 1

## 2018-03-05 MED ORDER — IODIXANOL 320 MG/ML IV SOLN
INTRAVENOUS | Status: DC | PRN
  Administered 2018-03-05: 170 mL via INTRAVENOUS

## 2018-03-05 MED ORDER — HYDRALAZINE HCL 20 MG/ML IJ SOLN
5.0000 mg | INTRAMUSCULAR | Status: AC | PRN
  Administered 2018-03-05 (×2): 5 mg via INTRAVENOUS

## 2018-03-05 MED ORDER — ACETAMINOPHEN 325 MG PO TABS
650.0000 mg | ORAL_TABLET | ORAL | Status: DC | PRN
  Administered 2018-03-05: 650 mg via ORAL
  Filled 2018-03-05 (×4): qty 2

## 2018-03-05 MED ORDER — MIDAZOLAM HCL 2 MG/2ML IJ SOLN
INTRAMUSCULAR | Status: DC | PRN
  Administered 2018-03-05 (×2): 2 mg via INTRAVENOUS
  Administered 2018-03-05: 1 mg via INTRAVENOUS

## 2018-03-05 MED ORDER — ONDANSETRON HCL 4 MG/2ML IJ SOLN
4.0000 mg | Freq: Four times a day (QID) | INTRAMUSCULAR | Status: DC | PRN
  Administered 2018-03-10: 4 mg via INTRAVENOUS
  Filled 2018-03-05 (×2): qty 2

## 2018-03-05 MED ORDER — MIDAZOLAM HCL 2 MG/2ML IJ SOLN
INTRAMUSCULAR | Status: AC
  Filled 2018-03-05: qty 2

## 2018-03-05 MED ORDER — HEPARIN (PORCINE) IN NACL 100-0.45 UNIT/ML-% IJ SOLN
1400.0000 [IU]/h | INTRAMUSCULAR | Status: DC
  Administered 2018-03-05: 1050 [IU]/h via INTRAVENOUS
  Administered 2018-03-06: 1150 [IU]/h via INTRAVENOUS
  Administered 2018-03-07: 1300 [IU]/h via INTRAVENOUS
  Administered 2018-03-08: 1400 [IU]/h via INTRAVENOUS
  Filled 2018-03-05 (×3): qty 250

## 2018-03-05 MED ORDER — CITALOPRAM HYDROBROMIDE 20 MG PO TABS
20.0000 mg | ORAL_TABLET | Freq: Three times a day (TID) | ORAL | Status: DC
  Administered 2018-03-05 – 2018-03-14 (×25): 20 mg via ORAL
  Filled 2018-03-05 (×28): qty 1

## 2018-03-05 MED ORDER — ONDANSETRON HCL 4 MG/2ML IJ SOLN
INTRAMUSCULAR | Status: AC
  Filled 2018-03-05: qty 2

## 2018-03-05 MED ORDER — SODIUM CHLORIDE 0.9 % IV SOLN: 250.0000 mL | INTRAVENOUS | Status: DC | PRN

## 2018-03-05 MED ORDER — HEPARIN (PORCINE) IN NACL 2-0.9 UNIT/ML-% IJ SOLN
INTRAMUSCULAR | Status: AC
  Filled 2018-03-05: qty 1500

## 2018-03-05 MED ORDER — LABETALOL HCL 5 MG/ML IV SOLN
10.0000 mg | INTRAVENOUS | Status: DC | PRN
  Administered 2018-03-05: 10 mg via INTRAVENOUS

## 2018-03-05 MED ORDER — SODIUM CHLORIDE 0.9 % IV SOLN
INTRAVENOUS | Status: DC
  Administered 2018-03-05 – 2018-03-06 (×3): via INTRAVENOUS

## 2018-03-05 MED ORDER — LIDOCAINE HCL 1 % IJ SOLN
INTRAMUSCULAR | Status: AC
  Filled 2018-03-05: qty 20

## 2018-03-05 MED ORDER — SODIUM CHLORIDE 0.9 % IV SOLN
INTRAVENOUS | Status: AC
  Administered 2018-03-05: 17:00:00 via INTRAVENOUS

## 2018-03-05 SURGICAL SUPPLY — 21 items
CATH ANGIO 5F BER2 65CM (CATHETERS) ×1 IMPLANT
CATH ANGIO 5F PIGTAIL 65CM (CATHETERS) ×1 IMPLANT
CATH QUICKCROSS SUPP .035X90CM (MICROCATHETER) ×1 IMPLANT
CATH SOFT-VU 4F 65 STRAIGHT (CATHETERS) IMPLANT
CATH SOFT-VU STRAIGHT 4F 65CM (CATHETERS) ×3
CATH SOS OMNI O 5F 80CM (CATHETERS) ×1 IMPLANT
COVER PRB 48X5XTLSCP FOLD TPE (BAG) IMPLANT
COVER PROBE 5X48 (BAG) ×3
DEVICE TORQUE .025-.038 (MISCELLANEOUS) ×1 IMPLANT
GUIDEWIRE ANGLED .035X260CM (WIRE) ×1 IMPLANT
KIT PV (KITS) ×3 IMPLANT
SHEATH AVANTI 11CM 6FR (SHEATH) ×1 IMPLANT
SHEATH HIGHFLEX ANSEL 7FR 55CM (SHEATH) ×2 IMPLANT
SYR MEDRAD MARK V 150ML (SYRINGE) ×3 IMPLANT
TRANSDUCER W/STOPCOCK (MISCELLANEOUS) ×3 IMPLANT
TRAY PV CATH (CUSTOM PROCEDURE TRAY) ×3 IMPLANT
WIRE AQUATRAK .035X150 ANG (WIRE) ×1 IMPLANT
WIRE BENTSON .035X145CM (WIRE) ×1 IMPLANT
WIRE HITORQ VERSACORE ST 145CM (WIRE) ×1 IMPLANT
WIRE ROSEN-J .035X180CM (WIRE) ×1 IMPLANT
WIRE ROSEN-J .035X260CM (WIRE) ×1 IMPLANT

## 2018-03-05 NOTE — Progress Notes (Signed)
PROGRESS NOTE    [name]  XIP:[id number] DOB: [birth year] DOA: 02/28/2018 PCP: [name], MD     Brief Narrative:  [name] is a 77 year old female with past medical history of CAD, PVD and COPD who is had worsening left hip issues that have gotten to the point where she has not ambulated much over the past month and presented to the emergency room on 3/10 with complaints of nausea, weakness and dyspnea on exertion and found to be in acute respiratory failure with hypoxia. Workup discovered multiple subacute pulmonary embolus on CT scan as well as a large acute DVT in her left femoral vein. 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 Patient started on heparin and admitted to the hospitalist service. She was evaluated by vascular surgery in regards to her questionable mesenteric artery stenosis and plans are for abdominal angiogram on Friday, 3/15.  Assessment & Plan:   Principal Problem:   Pulmonary embolism (HCC) Active Problems:   Hypothyroidism   Essential hypertension   COPD (chronic obstructive pulmonary disease) (HCC)   CAD (coronary artery disease)   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HCC)   Acute on chronic respiratory failure with hypoxia (HCC)   PE and left femoral DVT -Patient with hx of DVT post-operation 15 years ago. She was on coumadin at that time.  -Patient has not been ambulatory due to chronic hip pain -Echo without right heart strain  -Continue IV heparin. Plan to start xarelto after angiogram   Superior mesenteric artery stenosis -Vascular surgery consulted, plan for angiogram today  Essential hypertension -Stable  COPD -Stable without exacerbation  Left hip pain secondary to degenerative joint disease -Pain control, follow-up with orthopedic surgery as outpatient   DVT prophylaxis: Heparin gtt Code Status: Full Family Communication: Minister at bedside Disposition Plan: Pending angiogram  3/15   Consultants:   Vascular surgery  Procedures:   None   Antimicrobials:  Anti-infectives (From admission, onward)   None       Subjective: Continues to have nausea, vomiting yesterday.  She states that yesterday was the first time in a while where she was able to eat some solid food.  No new complaints.  Has been able to ambulate.  Denies any abdominal pain  Objective: Vitals:   03/05/18 0512 03/05/18 0700 03/05/18 0900 03/05/18 0945  BP: (!) 153/58 (!) 173/61 (!) 157/59   Pulse: 73 80    Resp: 18 18    Temp: 98.5 F (36.9 C) 98.6 F (37 C)    TempSrc: Oral Oral    SpO2: 95% 92%  94%  Weight:      Height:        Intake/Output Summary (Last 24 hours) at 03/05/2018 1246 Last data filed at 03/05/2018 0900 Gross per 24 hour  Intake 508.28 ml  Output 525 ml  Net -16.72 ml   Filed Weights   03/03/18 0429 03/04/18 0514 03/05/18 0433  Weight: 79.3 kg (174 lb 14.4 oz) 79.2 kg (174 lb 8 oz) 80 kg (176 lb 6.4 oz)   Examination: General exam: Appears calm and comfortable  Respiratory system: Clear to auscultation. Respiratory effort normal. Cardiovascular system: S1 & S2 heard, RRR. No JVD, murmurs, rubs, gallops or clicks. No pedal edema. Gastrointestinal system: Abdomen is nondistended, soft and nontender. No organomegaly or masses felt. Normal bowel sounds heard. Central nervous system: Alert and oriented. No focal neurological deficits. Extremities: Symmetric 5 x 5 power. Skin: No rashes, lesions or ulcers Psychiatry: Judgement  and insight appear normal. Mood & affect appropriate.    Data Reviewed: I have personally reviewed following labs and imaging studies  CBC: Recent Labs  Lab 02/28/18 1924 03/02/18 0413 03/03/18 0514 03/04/18 0725 03/05/18 0724  WBC 10.7* 8.3 8.0 8.4 8.1  HGB 12.7 12.7 11.7* 12.2 11.8*  HCT 39.8 41.2 37.4 39.0 37.4  MCV 88.1 89.6 88.8 87.6 88.6  PLT 248 198 188 180 263   Basic Metabolic Panel: Recent Labs  Lab  02/28/18 1924 03/01/18 1204 03/02/18 0413 03/05/18 0724  NA 139 141 142 143  K 2.9* 3.0* 3.7 3.5  CL 98* 103 105 108  CO2 28 30 27 28   GLUCOSE 97 106* 89 102*  BUN 17 11 10 9   CREATININE 1.35* 0.89 0.82 0.81  CALCIUM 9.0 8.6* 8.9 9.5  MG  --  1.5*  --   --    GFR: Estimated Creatinine Clearance: 64.6 mL/min (by C-G formula based on SCr of 0.81 mg/dL). Liver Function Tests: Recent Labs  Lab 02/28/18 1924  AST 15  ALT 10*  ALKPHOS 85  BILITOT 0.3  PROT 6.4*  ALBUMIN 3.3*   Recent Labs  Lab 02/28/18 1924  LIPASE 20   No results for input(s): AMMONIA in the last 168 hours. Coagulation Profile: No results for input(s): INR, PROTIME in the last 168 hours. Cardiac Enzymes: Recent Labs  Lab 03/01/18 0102  TROPONINI <0.03   BNP (last 3 results) Recent Labs    08/17/17 1038 09/14/17 0921  PROBNP 612 503   HbA1C: No results for input(s): HGBA1C in the last 72 hours. CBG: No results for input(s): GLUCAP in the last 168 hours. Lipid Profile: No results for input(s): CHOL, HDL, LDLCALC, TRIG, CHOLHDL, LDLDIRECT in the last 72 hours. Thyroid Function Tests: No results for input(s): TSH, T4TOTAL, FREET4, T3FREE, THYROIDAB in the last 72 hours. Anemia Panel: No results for input(s): VITAMINB12, FOLATE, FERRITIN, TIBC, IRON, RETICCTPCT in the last 72 hours. Sepsis Labs: No results for input(s): PROCALCITON, LATICACIDVEN in the last 168 hours.  Recent Results (from the past 240 hour(s))  Surgical pcr screen     Status: None   Collection Time: 03/04/18  9:04 PM  Result Value Ref Range Status   MRSA, PCR NEGATIVE NEGATIVE Final   Staphylococcus aureus NEGATIVE NEGATIVE Final    Comment: (NOTE) The Xpert SA Assay (FDA approved for NASAL specimens in patients 40 years of age and older), is one component of a comprehensive surveillance program. It is not intended to diagnose infection nor to guide or monitor treatment. Performed at [facility], [place] [address] [place], [place] [address]        Radiology Studies: No results found.    Scheduled Meds: . [MAR Hold] allopurinol  100 mg Oral Daily  . [MAR Hold] cholecalciferol  1,000 Units Oral Daily  . [MAR Hold] citalopram  20 mg Oral TID  . [MAR Hold] feeding supplement  1 Container Oral TID BM  . [MAR Hold] ferrous sulfate  325 mg Oral Q breakfast  . [MAR Hold] lidocaine  1 patch Transdermal Q24H  . [MAR Hold] pantoprazole  80 mg Oral Daily  . [MAR Hold] pilocarpine  1 drop Both Eyes BID  . [MAR Hold] pramipexole  0.25 mg Oral QHS  . [MAR Hold] roflumilast  500 mcg Oral Daily  . [MAR Hold] traZODone  150 mg Oral QHS  . [MAR Hold] umeclidinium bromide  1 puff Inhalation Daily   Continuous Infusions: . sodium  chloride 100 mL/hr at 03/05/18 0549  . heparin 1,050 Units/hr (03/04/18 1802)  . heparin       LOS: 4 days    Time spent: 20 minutes   Dessa Phi, DO [facility] [email or web address] Password [facility] 03/05/2018, 12:46 PM

## 2018-03-05 NOTE — Progress Notes (Signed)
Pure Wick place for urine output.

## 2018-03-05 NOTE — Op Note (Signed)
Procedure: Abdominal aortogram selective superior mesenteric angiogram attempted superior mesenteric artery stent  Preoperative diagnosis: Chronic mesenteric ischemia  Postoperative diagnosis: Same  Anesthesia: Local with IV sedation  Operative findings: #1 80% stenosis superior mesenteric artery #2 very tortuous iliac arteries number #3 90% stenosis inferior mesenteric artery  Operative details: After obtaining informed consent, the patient was taken the PV lab.  The patient was placed in supine position Angio table.  Both groins were prepped and draped in usual sterile fashion.  Local anesthesia was infiltrated over the right common femoral artery.  Ultrasound was used to identify the right common femoral artery and this was cannulated with an introducer needle and an 035 Bentson wire advanced into the abdominal aorta.  There was severe tortuosity of the right iliac system.  I was able to advance a 5 French sheath into the distal external iliac artery but required the addition of a Berenstein 2 catheter to direct the wire up into the abdominal aorta.  The dilator for the sheath was then placed back over the guidewire and the sheath fully advanced into the artery.  At this point a 5 French pigtail catheter was placed over the guidewire and advanced into the abdominal aorta and abdominal aortogram was obtained in a AP lateral and 70 degree RAO projection.  This showed a patent celiac artery.  The superior mesenteric artery has a 80% proximal stenosis.  There is a 90% stenosis of the inferior mesenteric artery.  At this point we attempted to try to stent the superior mesenteric artery.  Initially the guidewire was placed back to the pigtail catheter exchanged for a 035 sauce catheter.  With some manipulation I was able to use this to catheterize the superior mesenteric artery and advanced an 035 angled Glidewire across this.  However on trying to remove the sauce catheter the wire kicked back up into the  aorta secondary to the tortuosity and iliacs and stored energy.  We tried a few more times to get wire access but again the wire would push out each time across the lesion.  The patient was given 8000 units of intravenous heparin.  At this point an Nice wire was placed through the sheath and the 5 [place] sheath was switched out for a 7 [place] long flex sheath.  This was advanced into the abdominal aorta to give [place] extra support.  I was then able again to get the sauce aimed at the superior mesenteric artery and place an 035 angled Glidewire across this.  However again even with trying to use a quick cross catheter with lower profile we could not get this to engage completely and that the superior mesenteric artery and have a stable platform to achieve stenting.  At this point we decided to abort the procedure.  The patient tolerated procedure well and there were no complications.  The patient was taken back to the holding area the sheath in place after pulling it down to the right external iliac.  Operative management: The patient will be scheduled to come back to the Cath Lab on Monday, March 08, 2018.  At that point Dr. Donzetta Matters and myself will try again to place a superior mesenteric artery stent using a directional sheath.    Ruta Hinds, MD [facility] Office: [phone] Pager: [phone]

## 2018-03-05 NOTE — Progress Notes (Signed)
ANTICOAGULATION CONSULT NOTE - Follow Up Consult  Pharmacy Consult for Heparin Indication: DVT, PE  Allergies  Allergen Reactions  . Zolpidem Tartrate Anxiety    CONFUSION   . Pitavastatin Other (See Comments)    Not sure  . Ropinirole Nausea Only    Makes legs jump  . Penicillins Rash    Patient Measurements: Height: 5\' 8"  (172.7 cm) Weight: 176 lb 6.4 oz (80 kg)(scale A) IBW/kg (Calculated) : 63.9 Heparin Dosing Weight:    Vital Signs: Temp: 98.6 F (37 C) (03/15 0700) Temp Source: Oral (03/15 0700) BP: 162/52 (03/15 1605) Pulse Rate: 89 (03/15 1605)  Labs: Recent Labs    03/03/18 0514 03/04/18 0725 03/05/18 0724  HGB 11.7* 12.2 11.8*  HCT 37.4 39.0 37.4  PLT 188 180 165  HEPARINUNFRC 0.32 0.33 0.36  CREATININE  --   --  0.81    Estimated Creatinine Clearance: 64.6 mL/min (by C-G formula based on SCr of 0.81 mg/dL).   Assessment:  Anticoag: heparin for acute vs chronic PE with R heart strain and also w/ LLE DVT (and hx of DVTs on xarelto in the past but this was d/c due to a GIB in 01/2017). HL 0.36 in goal. CBC stable.  Sheath removed at 1600 pm - to restart heparin at midnight  Goal of Therapy:  Heparin level 0.3-0.7 units/ml Monitor platelets by anticoagulation protocol: Yes   Plan:  Heparin at 1050 units / hr starting at midnight 8 hour heparin level  Thank you Anette Guarneri, PharmD [phone] 03/05/2018,4:27 PM

## 2018-03-05 NOTE — Progress Notes (Signed)
ANTICOAGULATION CONSULT NOTE - Follow Up Consult  Pharmacy Consult for Heparin Indication: DVT, PE  Allergies  Allergen Reactions  . Zolpidem Tartrate Anxiety    CONFUSION   . Pitavastatin Other (See Comments)    Not sure  . Ropinirole Nausea Only    Makes legs jump  . Penicillins Rash    Patient Measurements: Height: 5\' 8"  (172.7 cm) Weight: 176 lb 6.4 oz (80 kg)(scale A) IBW/kg (Calculated) : 63.9 Heparin Dosing Weight:    Vital Signs: Temp: 98.6 F (37 C) (03/15 0700) Temp Source: Oral (03/15 0700) BP: 157/59 (03/15 0900) Pulse Rate: 80 (03/15 0700)  Labs: Recent Labs    03/03/18 0514 03/04/18 0725 03/05/18 0724  HGB 11.7* 12.2 11.8*  HCT 37.4 39.0 37.4  PLT 188 180 165  HEPARINUNFRC 0.32 0.33 0.36  CREATININE  --   --  0.81    Estimated Creatinine Clearance: 64.6 mL/min (by C-G formula based on SCr of 0.81 mg/dL).   Assessment:  Anticoag: heparin for acute vs chronic PE with R heart strain and also w/ LLE DVT (and hx of DVTs on xarelto in the past but this was d/c due to a GIB in 01/2017). HL 0.36 in goal. CBC stable.  Goal of Therapy:  Heparin level 0.3-0.7 units/ml Monitor platelets by anticoagulation protocol: Yes   Plan:  Continue Heparin at 1050 units/hr. D/c on call to cath (angiogram). Daily HL, CBC Mesenteric angio on Friday 03/05/18 (checking for degree of stenosis) Plan to start xarelto after angiogram on Friday   [name], PharmD, BCPS Clinical Staff Pharmacist Pager [phone]  [name] 03/05/2018,9:56 AM

## 2018-03-05 NOTE — Care Management Important Message (Signed)
Important Message  Patient Details  Name: [name] MRN: [id number] Date of Birth: [birth year]   Medicare Important Message Given:  Yes    Delorse Lek 03/05/2018, 3:32 PM

## 2018-03-05 NOTE — Progress Notes (Signed)
Pt's vitals stable after returning from OR, tylenol given for a complain of pain, liquid diet provided, rt femoral sheath is CDI, will continue to monitor

## 2018-03-05 NOTE — Progress Notes (Signed)
Order for sheath removal verified per post procedural orders. Procedure explained to patient and Rt femoral artery access site assessed: level 0, palpable dorsalis pedis . 7French Sheath removed and manual pressure applied for 30 minutes. Pre, peri, & post procedural vitals: HR 80, RR 15-20, O2 Sat upper 92-95, BP 150-165, Pain 5 out of 10. Back and lt hip. Distal pulses remained intact after sheath removal. Access site level 0 and dressed with 4X4 gauze and tegaderm.Post procedural instructions discussed and return demonstration from patient.

## 2018-03-05 NOTE — Progress Notes (Signed)
Pt left for OR at 11:08am  Palma Holter, RN

## 2018-03-06 LAB — BASIC METABOLIC PANEL
Anion gap: 10 (ref 5–15)
BUN: 7 mg/dL (ref 6–20)
CO2: 23 mmol/L (ref 22–32)
Calcium: 8.8 mg/dL — ABNORMAL LOW (ref 8.9–10.3)
Chloride: 108 mmol/L (ref 101–111)
Creatinine, Ser: 0.61 mg/dL (ref 0.44–1.00)
GFR calc Af Amer: >60 mL/min (ref >60)
GFR calc non Af Amer: >60 mL/min (ref >60)
Glucose, Bld: 92 mg/dL (ref 65–99)
Potassium: 3.4 mmol/L — ABNORMAL LOW (ref 3.5–5.1)
Sodium: 141 mmol/L (ref 135–145)

## 2018-03-06 LAB — HEPARIN LEVEL (UNFRACTIONATED)
Heparin Unfractionated: 0.2 IU/mL — ABNORMAL LOW (ref 0.30–0.70)
Heparin Unfractionated: 0.15 IU/mL — ABNORMAL LOW (ref 0.30–0.70)

## 2018-03-06 LAB — CBC
HCT: 36.3 % (ref 36.0–46.0)
Hemoglobin: 11.6 g/dL — ABNORMAL LOW (ref 12.0–15.0)
MCH: 28 pg (ref 26.0–34.0)
MCHC: 32 g/dL (ref 30.0–36.0)
MCV: 87.5 fL (ref 78.0–100.0)
Platelets: 154 10*3/uL (ref 150–400)
RBC: 4.15 MIL/uL (ref 3.87–5.11)
RDW: 15.2 % (ref 11.5–15.5)
WBC: 7.9 10*3/uL (ref 4.0–10.5)

## 2018-03-06 MED ORDER — POTASSIUM CHLORIDE CRYS ER 20 MEQ PO TBCR
40.0000 meq | EXTENDED_RELEASE_TABLET | Freq: Once | ORAL | Status: AC
  Administered 2018-03-06: 40 meq via ORAL
  Filled 2018-03-06: qty 2

## 2018-03-06 NOTE — Progress Notes (Signed)
PROGRESS NOTE    [name]  WNU:[id number] DOB: 1941/01/21 DOA: 02/28/2018 PCP: [name], MD     Brief Narrative:  [name] is a 77 year old female with past medical history of CAD, PVD and COPD who is had worsening left hip issues that have gotten to the point where she has not ambulated much over the past month and presented to the emergency room on 3/10 with complaints of nausea, weakness and dyspnea on exertion and found to be in acute respiratory failure with hypoxia. Workup discovered multiple subacute pulmonary embolus on CT scan as well as a large acute DVT in her left femoral vein. 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 Patient started on heparin and admitted to the hospitalist service. She was evaluated by vascular surgery in regards to her questionable mesenteric artery stenosis.  She underwent mesenteric angiogram on 3/15, however was a unable to stent SMA.  Plans are for reattempt on Monday 3/18.  Assessment & Plan:   Principal Problem:   Pulmonary embolism (HCC) Active Problems:   Hypothyroidism   Essential hypertension   COPD (chronic obstructive pulmonary disease) (HCC)   CAD (coronary artery disease)   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HCC)   Acute on chronic respiratory failure with hypoxia (HCC)   PE and left femoral DVT -Patient with hx of DVT post-operation 15 years ago. She was on coumadin at that time.  -Patient has not been ambulatory due to chronic hip pain -Echo without right heart strain  -Continue IV heparin. Plan to start xarelto next week after re-attempt at [facility] stent   Superior mesenteric artery stenosis -Vascular surgery consulted -Angiogram 3/15 but unable to place stent, plan for re-attempt 3/18   Essential hypertension -Stable  COPD -Stable without exacerbation  Left hip pain secondary to degenerative joint disease -Pain control, follow-up with orthopedic surgery as outpatient   DVT  prophylaxis: Heparin gtt Code Status: Full Family Communication: No family at bedside  Disposition Plan: Pending re-attempt SMA stent 3/18   Consultants:   Vascular surgery  Procedures:   Angiogram 3/15   Antimicrobials:  Anti-infectives (From admission, onward)   None       Subjective: Has some nausea but no vomiting.  No abdominal pain.  Denies any new complaints.  Objective: Vitals:   03/05/18 1845 03/05/18 2105 03/06/18 0430 03/06/18 0657  BP: 120/70 (!) 154/55 (!) 134/58   Pulse:  83 80   Resp:  16 18   Temp:  98 F (36.7 C) 98.2 F (36.8 C)   TempSrc:  Oral Oral   SpO2:  95% 94%   Weight:    80.1 kg (176 lb 9.4 oz)  Height:        Intake/Output Summary (Last 24 hours) at 03/06/2018 1307 Last data filed at 03/06/2018 0507 Gross per 24 hour  Intake 2767.32 ml  Output 550 ml  Net 2217.32 ml   Filed Weights   03/04/18 0514 03/05/18 0433 03/06/18 0657  Weight: 79.2 kg (174 lb 8 oz) 80 kg (176 lb 6.4 oz) 80.1 kg (176 lb 9.4 oz)    Examination: General exam: Appears calm and comfortable  Respiratory system: Clear to auscultation. Respiratory effort normal. Cardiovascular system: S1 & S2 heard, RRR. No JVD, murmurs, rubs, gallops or clicks. No pedal edema. Gastrointestinal system: Abdomen is nondistended, soft and nontender. No organomegaly or masses felt. Normal bowel sounds heard. Central nervous system: Alert and oriented. No focal neurological deficits. Extremities: Symmetric 5 x 5  power. Skin: No rashes, lesions or ulcers Psychiatry: Judgement and insight appear normal. Mood & affect appropriate.     Data Reviewed: I have personally reviewed following labs and imaging studies  CBC: Recent Labs  Lab 03/02/18 0413 03/03/18 0514 03/04/18 0725 03/05/18 0724 03/06/18 0439  WBC 8.3 8.0 8.4 8.1 7.9  HGB 12.7 11.7* 12.2 11.8* 11.6*  HCT 41.2 37.4 39.0 37.4 36.3  MCV 89.6 88.8 87.6 88.6 87.5  PLT 198 188 180 165 893   Basic Metabolic  Panel: Recent Labs  Lab 02/28/18 1924 03/01/18 1204 03/02/18 0413 03/05/18 0724 03/06/18 0439  NA 139 141 142 143 141  K 2.9* 3.0* 3.7 3.5 3.4*  CL 98* 103 105 108 108  CO2 28 30 27 28 23   GLUCOSE 97 106* 89 102* 92  BUN 17 11 10 9 7   CREATININE 1.35* 0.89 0.82 0.81 0.61  CALCIUM 9.0 8.6* 8.9 9.5 8.8*  MG  --  1.5*  --   --   --    GFR: Estimated Creatinine Clearance: 65.5 mL/min (by C-G formula based on SCr of 0.61 mg/dL). Liver Function Tests: Recent Labs  Lab 02/28/18 1924  AST 15  ALT 10*  ALKPHOS 85  BILITOT 0.3  PROT 6.4*  ALBUMIN 3.3*   Recent Labs  Lab 02/28/18 1924  LIPASE 20   No results for input(s): AMMONIA in the last 168 hours. Coagulation Profile: No results for input(s): INR, PROTIME in the last 168 hours. Cardiac Enzymes: Recent Labs  Lab 03/01/18 0102  TROPONINI <0.03   BNP (last 3 results) Recent Labs    08/17/17 1038 09/14/17 0921  PROBNP 612 503   HbA1C: No results for input(s): HGBA1C in the last 72 hours. CBG: No results for input(s): GLUCAP in the last 168 hours. Lipid Profile: No results for input(s): CHOL, HDL, LDLCALC, TRIG, CHOLHDL, LDLDIRECT in the last 72 hours. Thyroid Function Tests: No results for input(s): TSH, T4TOTAL, FREET4, T3FREE, THYROIDAB in the last 72 hours. Anemia Panel: No results for input(s): VITAMINB12, FOLATE, FERRITIN, TIBC, IRON, RETICCTPCT in the last 72 hours. Sepsis Labs: No results for input(s): PROCALCITON, LATICACIDVEN in the last 168 hours.  Recent Results (from the past 240 hour(s))  Surgical pcr screen     Status: None   Collection Time: 03/04/18  9:04 PM  Result Value Ref Range Status   MRSA, PCR NEGATIVE NEGATIVE Final   Staphylococcus aureus NEGATIVE NEGATIVE Final    Comment: (NOTE) The Xpert SA Assay (FDA approved for NASAL specimens in patients 57 years of age and older), is one component of a comprehensive surveillance program. It is not intended to diagnose infection nor  to guide or monitor treatment. Performed at [facility], [place] [address], [place], [place] [address]        Radiology Studies: No results found.    Scheduled Meds: . allopurinol  100 mg Oral Daily  . cholecalciferol  1,000 Units Oral Daily  . citalopram  20 mg Oral TID  . feeding supplement  1 Container Oral TID BM  . ferrous sulfate  325 mg Oral Q breakfast  . lidocaine  1 patch Transdermal Q24H  . pantoprazole  80 mg Oral Daily  . pilocarpine  1 drop Both Eyes BID  . pramipexole  0.25 mg Oral QHS  . roflumilast  500 mcg Oral Daily  . sodium chloride flush  3 mL Intravenous Q12H  . traZODone  150 mg Oral QHS  . umeclidinium bromide  1  puff Inhalation Daily   Continuous Infusions: . sodium chloride    . heparin 1,150 Units/hr (03/06/18 0911)     LOS: 5 days    Time spent: 20 minutes   Dessa Phi, DO [facility] [email or web address] Password [email or web address] 03/06/2018, 1:07 PM

## 2018-03-06 NOTE — Progress Notes (Signed)
ANTICOAGULATION CONSULT NOTE - Follow Up Consult  Pharmacy Consult for Heparin Indication: DVT, PE  Allergies  Allergen Reactions  . Zolpidem Tartrate Anxiety    CONFUSION   . Pitavastatin Other (See Comments)    Not sure  . Ropinirole Nausea Only    Makes legs jump  . Penicillins Rash   Patient Measurements: Height: 5\' 8"  (172.7 cm) Weight: 176 lb 9.4 oz (80.1 kg) IBW/kg (Calculated) : 63.9 Heparin Dosing Weight: 77.8 kg  Vital Signs:    Labs: Recent Labs    03/04/18 0725 03/05/18 0724 03/06/18 0439 03/06/18 0702 03/06/18 1603  HGB 12.2 11.8* 11.6*  --   --   HCT 39.0 37.4 36.3  --   --   PLT 180 165 154  --   --   HEPARINUNFRC 0.33 0.36  --  0.20* 0.15*  CREATININE  --  0.81 0.61  --   --    Estimated Creatinine Clearance: 65.5 mL/min (by C-G formula based on SCr of 0.61 mg/dL).  Assessment: 55 yoF presents with acute vs. chronic PE with R heart strain, also with LLE DVT. She has a h/o DVTs on Xarelto, but this was discontinued due to GIB in 01/2017. Sheath removed at 1600 on 3/15, heparin restarted at midnight.   AM HL 0.2 - rate increased to 1150 units/hr Recheck HL 0.15 - will increase to 1300 units/hr without bolus  Goal of Therapy:  Heparin level 0.3-0.7 units/ml Monitor platelets by anticoagulation protocol: Yes   Plan:  Increase heparin to 1300 units/hr Recheck heparin level with AM labs Daily heparin level, CBC Monitor s/sx of bleeding  Rober Minion, PharmD., MS Clinical Pharmacist Pager:  [phone] Thank you for allowing pharmacy to be part of this patients care team. 03/06/2018,4:57 PM

## 2018-03-06 NOTE — Progress Notes (Addendum)
  Progress Note    03/06/2018 11:20 AM 1 Day Post-Op  Subjective:  Tolerating breakfast; soreness R groin cath site   Vitals:   03/05/18 2105 03/06/18 0430  BP: (!) 154/55 (!) 134/58  Pulse: 83 80  Resp: 16 18  Temp: 98 F (36.7 C) 98.2 F (36.8 C)  SpO2: 95% 94%   Physical Exam: Lungs:  Non labored on exam Extremities:  Symmetrical radial pulses; symmetrical DP pulses; soft R groin without palpable hematoma or pseudoaneurysm Abdomen:  Soft, BS+ Neurologic: A&O  CBC    Component Value Date/Time   WBC 7.9 03/06/2018 0439   RBC 4.15 03/06/2018 0439   HGB 11.6 (L) 03/06/2018 0439   HGB 12.9 08/17/2017 1038   HCT 36.3 03/06/2018 0439   HCT 38.2 08/17/2017 1038   PLT 154 03/06/2018 0439   PLT 237 08/17/2017 1038   MCV 87.5 03/06/2018 0439   MCV 88 08/17/2017 1038   MCH 28.0 03/06/2018 0439   MCHC 32.0 03/06/2018 0439   RDW 15.2 03/06/2018 0439   RDW 15.7 (H) 08/17/2017 1038   LYMPHSABS 1.9 04/26/2014 1237   MONOABS 0.8 04/26/2014 1237   EOSABS 0.2 04/26/2014 1237   BASOSABS 0.1 04/26/2014 1237    BMET    Component Value Date/Time   NA 141 03/06/2018 0439   NA 144 09/14/2017 0921   K 3.4 (L) 03/06/2018 0439   CL 108 03/06/2018 0439   CO2 23 03/06/2018 0439   GLUCOSE 92 03/06/2018 0439   BUN 7 03/06/2018 0439   BUN 12 09/14/2017 0921   CREATININE 0.61 03/06/2018 0439   CALCIUM 8.8 (L) 03/06/2018 0439   GFRNONAA >60 03/06/2018 0439   GFRAA >60 03/06/2018 0439    INR    Component Value Date/Time   INR 1.16 02/13/2017 1148     Intake/Output Summary (Last 24 hours) at 03/06/2018 1120 Last data filed at 03/06/2018 0507 Gross per 24 hour  Intake 2767.32 ml  Output 550 ml  Net 2217.32 ml     Assessment/Plan:  77 y.o. female is s/p mesenteric angiogram 1 Day Post-Op   Unable to stent SMA; R groin catheterization site unremarkable Plan is to re-attempt on Monday with Dr. [name] and Dr. Donzetta Matters Continue IV heparin Medical management per primary  team   Dagoberto Ligas, PA-C [facility] [phone] 03/06/2018 11:20 AM

## 2018-03-06 NOTE — Progress Notes (Signed)
ANTICOAGULATION CONSULT NOTE - Follow Up Consult  Pharmacy Consult for Heparin Indication: DVT, PE  Allergies  Allergen Reactions  . Zolpidem Tartrate Anxiety    CONFUSION   . Pitavastatin Other (See Comments)    Not sure  . Ropinirole Nausea Only    Makes legs jump  . Penicillins Rash   Patient Measurements: Height: 5\' 8"  (172.7 cm) Weight: 176 lb 9.4 oz (80.1 kg) IBW/kg (Calculated) : 63.9 Heparin Dosing Weight: 77.8 kg  Vital Signs: Temp: 98.2 F (36.8 C) (03/16 0430) Temp Source: Oral (03/16 0430) BP: 134/58 (03/16 0430) Pulse Rate: 80 (03/16 0430)  Labs: Recent Labs    03/04/18 0725 03/05/18 0724 03/06/18 0439 03/06/18 0702  HGB 12.2 11.8* 11.6*  --   HCT 39.0 37.4 36.3  --   PLT 180 165 154  --   HEPARINUNFRC 0.33 0.36  --  0.20*  CREATININE  --  0.81 0.61  --    Estimated Creatinine Clearance: 65.5 mL/min (by C-G formula based on SCr of 0.61 mg/dL).  Assessment: 59 yoF presents with acute vs. chronic PE with R heart strain, also with LLE DVT. She has a h/o DVTs on Xarelto, but this was discontinued due to GIB in 01/2017. Sheath removed at 1600 on 3/15, heparin restarted at midnight. Initial heparin level subtherapeutic at 0.20. No infusion issues per RN. CBC stable, no bleeding noted.  Goal of Therapy:  Heparin level 0.3-0.7 units/ml Monitor platelets by anticoagulation protocol: Yes   Plan:  Increase heparin to 1150 units/hr Recheck heparin level in 8 hours Daily heparin level, CBC Monitor s/sx of bleeding  Marcelyn Ruppe N. Gerarda Fraction, PharmD PGY1 Pharmacy Resident Pager: [phone] 03/06/2018,7:45 AM

## 2018-03-06 NOTE — Progress Notes (Signed)
Pt is stable, denies chest pain and SOB, pain medicine given around the clock for a complain of back pain, IV heparin continue @ 11.50ml/hr, will continue to monitor the patient  Palma Holter, RN

## 2018-03-07 LAB — BASIC METABOLIC PANEL
Anion gap: 7 (ref 5–15)
BUN: 11 mg/dL (ref 6–20)
CO2: 24 mmol/L (ref 22–32)
Calcium: 8.9 mg/dL (ref 8.9–10.3)
Chloride: 109 mmol/L (ref 101–111)
Creatinine, Ser: 0.76 mg/dL (ref 0.44–1.00)
GFR calc Af Amer: >60 mL/min (ref >60)
GFR calc non Af Amer: >60 mL/min (ref >60)
Glucose, Bld: 98 mg/dL (ref 65–99)
Potassium: 3.4 mmol/L — ABNORMAL LOW (ref 3.5–5.1)
Sodium: 140 mmol/L (ref 135–145)

## 2018-03-07 LAB — MAGNESIUM: Magnesium: 1.4 mg/dL — ABNORMAL LOW (ref 1.7–2.4)

## 2018-03-07 LAB — CBC
HCT: 31.7 % — ABNORMAL LOW (ref 36.0–46.0)
Hemoglobin: 10 g/dL — ABNORMAL LOW (ref 12.0–15.0)
MCH: 27.5 pg (ref 26.0–34.0)
MCHC: 31.5 g/dL (ref 30.0–36.0)
MCV: 87.1 fL (ref 78.0–100.0)
Platelets: 144 10*3/uL — ABNORMAL LOW (ref 150–400)
RBC: 3.64 MIL/uL — ABNORMAL LOW (ref 3.87–5.11)
RDW: 15.4 % (ref 11.5–15.5)
WBC: 7.1 10*3/uL (ref 4.0–10.5)

## 2018-03-07 LAB — HEPARIN LEVEL (UNFRACTIONATED): Heparin Unfractionated: 0.39 IU/mL (ref 0.30–0.70)

## 2018-03-07 MED ORDER — MAGNESIUM SULFATE 2 GM/50ML IV SOLN
2.0000 g | Freq: Once | INTRAVENOUS | Status: AC
  Administered 2018-03-07: 2 g via INTRAVENOUS
  Filled 2018-03-07: qty 50

## 2018-03-07 MED ORDER — POTASSIUM CHLORIDE CRYS ER 20 MEQ PO TBCR
40.0000 meq | EXTENDED_RELEASE_TABLET | Freq: Once | ORAL | Status: AC
  Administered 2018-03-07: 40 meq via ORAL
  Filled 2018-03-07: qty 2

## 2018-03-07 NOTE — Progress Notes (Signed)
Pt is stable, walked inside to the room and to the bathroom with the use of her walker, IV heparin is continue @14cc /hr, denies CP but pain medicine given for a complain of back pain around the clock, will continue to monitor the patient.  Palma Holter, RN

## 2018-03-07 NOTE — Progress Notes (Signed)
ANTICOAGULATION CONSULT NOTE - Follow Up Consult  Pharmacy Consult for Heparin Indication: DVT, PE  Allergies  Allergen Reactions  . Zolpidem Tartrate Anxiety    CONFUSION   . Pitavastatin Other (See Comments)    Not sure  . Ropinirole Nausea Only    Makes legs jump  . Penicillins Rash   Patient Measurements: Height: 5\' 8"  (172.7 cm) Weight: 179 lb 9.6 oz (81.5 kg)(scale a) IBW/kg (Calculated) : 63.9 Heparin Dosing Weight: 77.8 kg  Vital Signs: Temp: 98 F (36.7 C) (03/17 0622) Temp Source: Oral (03/17 0622) BP: 113/41 (03/17 0622) Pulse Rate: 61 (03/17 0622)  Labs: Recent Labs    03/05/18 0724 03/06/18 0439 03/06/18 0702 03/06/18 1603 03/07/18 0515  HGB 11.8* 11.6*  --   --  10.0*  HCT 37.4 36.3  --   --  31.7*  PLT 165 154  --   --  144*  HEPARINUNFRC 0.36  --  0.20* 0.15* 0.39  CREATININE 0.81 0.61  --   --  0.76   Estimated Creatinine Clearance: 65.9 mL/min (by C-G formula based on SCr of 0.76 mg/dL).  Assessment: 46 yoF presents with acute vs. chronic PE with R heart strain, also with LLE DVT. She has a h/o DVTs on Xarelto last year, but this was discontinued due to GIB.  Plan is to resume rivaroxaban after procedure on Monday.  Today she is within desired goal range but on lower end.  Will increase rate slightly to keep her from falling below goal.  Her H/H has trending down some as well as platelets but no noted bleeding complications.  HL:  0.39 IU/ml  Goal of Therapy:  Heparin level 0.3-0.7 units/ml Monitor platelets by anticoagulation protocol: Yes   Plan:  Increase heparin to 1400 units/hr Daily heparin level, CBC Monitor s/sx of bleeding  Rober Minion, PharmD., MS Clinical Pharmacist Pager:  [phone] Thank you for allowing pharmacy to be part of this patients care team. 03/07/2018,9:55 AM

## 2018-03-07 NOTE — Progress Notes (Addendum)
PROGRESS NOTE    [name]  LTJ:[id number] DOB: 08-18-1941 DOA: 02/28/2018 PCP: [name], MD     Brief Narrative:  [name] is a 77 year old female with past medical history of CAD, PVD and COPD who is had worsening left hip issues that have gotten to the point where she has not ambulated much over the past month and presented to the emergency room on 3/10 with complaints of nausea, weakness and dyspnea on exertion and found to be in acute respiratory failure with hypoxia. Workup discovered multiple subacute pulmonary embolus on CT scan as well as a large acute DVT in her left femoral vein. 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 Patient started on heparin and admitted to the hospitalist service. She was evaluated by vascular surgery in regards to her questionable mesenteric artery stenosis.  She underwent mesenteric angiogram on 3/15, however was a unable to stent SMA.  Plans are for reattempt on Monday 3/18.  Assessment & Plan:   Principal Problem:   Pulmonary embolism (HCC) Active Problems:   Hypothyroidism   Essential hypertension   COPD (chronic obstructive pulmonary disease) (HCC)   CAD (coronary artery disease)   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HCC)   Acute on chronic respiratory failure with hypoxia (HCC)   PE and left femoral DVT -Patient with hx of DVT post-operation 15 years ago. She was on coumadin at that time.  -Patient has not been ambulatory due to chronic hip pain -Echo without right heart strain  -Continue IV heparin. Plan to start xarelto next week after re-attempt at [facility] stent   Superior mesenteric artery stenosis -Vascular surgery consulted -Angiogram 3/15 but unable to place stent, plan for re-attempt 3/18   Essential hypertension -Stable  COPD -Stable without exacerbation  Left hip pain secondary to degenerative joint disease -Pain control, follow-up with orthopedic surgery as  outpatient  Hypokalemia -Replace, trend  Hypomagnesemia -Replace, trend   DVT prophylaxis: Heparin gtt Code Status: Full Family Communication: No family at bedside  Disposition Plan: Pending re-attempt SMA stent 3/18   Consultants:   Vascular surgery  Procedures:   Angiogram 3/15   Antimicrobials:  Anti-infectives (From admission, onward)   None       Subjective: States that she is not feeling as well today.  Has some nausea without vomiting.  She states that her stomach feels "sick".  Also complains of left hip pain  Objective: Vitals:   03/06/18 1430 03/06/18 1955 03/07/18 0622 03/07/18 0746  BP: 125/60 109/70 (!) 113/41   Pulse: 63 71 61   Resp: 18 18 18    Temp: 98.4 F (36.9 C) 98.3 F (36.8 C) 98 F (36.7 C)   TempSrc: Oral Oral Oral   SpO2: 97% 96% 96% 96%  Weight:   81.5 kg (179 lb 9.6 oz)   Height:        Intake/Output Summary (Last 24 hours) at 03/07/2018 1202 Last data filed at 03/07/2018 1031 Gross per 24 hour  Intake 876 ml  Output 1000 ml  Net -124 ml   Filed Weights   03/05/18 0433 03/06/18 0657 03/07/18 0622  Weight: 80 kg (176 lb 6.4 oz) 80.1 kg (176 lb 9.4 oz) 81.5 kg (179 lb 9.6 oz)    Examination: General exam: Appears calm  Respiratory system: Clear to auscultation. Respiratory effort normal. Cardiovascular system: S1 & S2 heard, RRR. No JVD, murmurs, rubs, gallops or clicks. No pedal edema. Gastrointestinal system: Abdomen is nondistended, soft and nontender to  palpation. No organomegaly or masses felt. Normal bowel sounds heard. Central nervous system: Alert and oriented. No focal neurological deficits. Extremities: Symmetric 5 x 5 power. Skin: No rashes, lesions or ulcers Psychiatry: Judgement and insight appear normal. Mood & affect appropriate.     Data Reviewed: I have personally reviewed following labs and imaging studies  CBC: Recent Labs  Lab 03/03/18 0514 03/04/18 0725 03/05/18 0724 03/06/18 0439  03/07/18 0515  WBC 8.0 8.4 8.1 7.9 7.1  HGB 11.7* 12.2 11.8* 11.6* 10.0*  HCT 37.4 39.0 37.4 36.3 31.7*  MCV 88.8 87.6 88.6 87.5 87.1  PLT 188 180 165 154 462*   Basic Metabolic Panel: Recent Labs  Lab 03/01/18 1204 03/02/18 0413 03/05/18 0724 03/06/18 0439 03/07/18 0515  NA 141 142 143 141 140  K 3.0* 3.7 3.5 3.4* 3.4*  CL 103 105 108 108 109  CO2 30 27 28 23 24   GLUCOSE 106* 89 102* 92 98  BUN 11 10 9 7 11   CREATININE 0.89 0.82 0.81 0.61 0.76  CALCIUM 8.6* 8.9 9.5 8.8* 8.9  MG 1.5*  --   --   --  1.4*   GFR: Estimated Creatinine Clearance: 65.9 mL/min (by C-G formula based on SCr of 0.76 mg/dL). Liver Function Tests: Recent Labs  Lab 02/28/18 1924  AST 15  ALT 10*  ALKPHOS 85  BILITOT 0.3  PROT 6.4*  ALBUMIN 3.3*   Recent Labs  Lab 02/28/18 1924  LIPASE 20   No results for input(s): AMMONIA in the last 168 hours. Coagulation Profile: No results for input(s): INR, PROTIME in the last 168 hours. Cardiac Enzymes: Recent Labs  Lab 03/01/18 0102  TROPONINI <0.03   BNP (last 3 results) Recent Labs    08/17/17 1038 09/14/17 0921  PROBNP 612 503   HbA1C: No results for input(s): HGBA1C in the last 72 hours. CBG: No results for input(s): GLUCAP in the last 168 hours. Lipid Profile: No results for input(s): CHOL, HDL, LDLCALC, TRIG, CHOLHDL, LDLDIRECT in the last 72 hours. Thyroid Function Tests: No results for input(s): TSH, T4TOTAL, FREET4, T3FREE, THYROIDAB in the last 72 hours. Anemia Panel: No results for input(s): VITAMINB12, FOLATE, FERRITIN, TIBC, IRON, RETICCTPCT in the last 72 hours. Sepsis Labs: No results for input(s): PROCALCITON, LATICACIDVEN in the last 168 hours.  Recent Results (from the past 240 hour(s))  Surgical pcr screen     Status: None   Collection Time: 03/04/18  9:04 PM  Result Value Ref Range Status   MRSA, PCR NEGATIVE NEGATIVE Final   Staphylococcus aureus NEGATIVE NEGATIVE Final    Comment: (NOTE) The Xpert SA Assay  (FDA approved for NASAL specimens in patients 31 years of age and older), is one component of a comprehensive surveillance program. It is not intended to diagnose infection nor to guide or monitor treatment. Performed at [facility], [place] [address] [place], [place] [address]        Radiology Studies: No results found.    Scheduled Meds: . allopurinol  100 mg Oral Daily  . cholecalciferol  1,000 Units Oral Daily  . citalopram  20 mg Oral TID  . feeding supplement  1 Container Oral TID BM  . ferrous sulfate  325 mg Oral Q breakfast  . lidocaine  1 patch Transdermal Q24H  . pantoprazole  80 mg Oral Daily  . pilocarpine  1 drop Both Eyes BID  . pramipexole  0.25 mg Oral QHS  . roflumilast  500 mcg Oral Daily  .  sodium chloride flush  3 mL Intravenous Q12H  . traZODone  150 mg Oral QHS  . umeclidinium bromide  1 puff Inhalation Daily   Continuous Infusions: . sodium chloride    . heparin 1,400 Units/hr (03/07/18 1055)     LOS: 6 days    Time spent: 20 minutes   Dessa Phi, DO [facility] [email or web address] Password [facility] 03/07/2018, 12:02 PM

## 2018-03-07 NOTE — Progress Notes (Signed)
Consent done, pt is aware for possible revascularization tomorrow and NPO after midnight, will continue to monitor  Palma Holter, RN

## 2018-03-08 ENCOUNTER — Encounter (HOSPITAL_COMMUNITY): Payer: Self-pay | Admitting: Vascular Surgery

## 2018-03-08 ENCOUNTER — Encounter (HOSPITAL_COMMUNITY)
Admission: EM | Disposition: A | Discharge status: Skilled Nursing Facility | Payer: Self-pay | Source: Home / Self Care | Attending: Internal Medicine

## 2018-03-08 ENCOUNTER — Inpatient Hospital Stay (HOSPITAL_COMMUNITY): Payer: Medicare HMO

## 2018-03-08 HISTORY — PX: PERIPHERAL VASCULAR INTERVENTION: CATH118257

## 2018-03-08 HISTORY — PX: VISCERAL ANGIOGRAPHY: CATH118276

## 2018-03-08 LAB — BASIC METABOLIC PANEL
Anion gap: 6 (ref 5–15)
BUN: 13 mg/dL (ref 6–20)
CO2: 24 mmol/L (ref 22–32)
Calcium: 9.1 mg/dL (ref 8.9–10.3)
Chloride: 108 mmol/L (ref 101–111)
Creatinine, Ser: 0.83 mg/dL (ref 0.44–1.00)
GFR calc Af Amer: >60 mL/min (ref >60)
GFR calc non Af Amer: >60 mL/min (ref >60)
Glucose, Bld: 103 mg/dL — ABNORMAL HIGH (ref 65–99)
Potassium: 3.9 mmol/L (ref 3.5–5.1)
Sodium: 138 mmol/L (ref 135–145)

## 2018-03-08 LAB — HEPARIN LEVEL (UNFRACTIONATED): Heparin Unfractionated: 0.63 IU/mL (ref 0.30–0.70)

## 2018-03-08 LAB — CBC
HCT: 33.1 % — ABNORMAL LOW (ref 36.0–46.0)
Hemoglobin: 10.5 g/dL — ABNORMAL LOW (ref 12.0–15.0)
MCH: 27.9 pg (ref 26.0–34.0)
MCHC: 31.7 g/dL (ref 30.0–36.0)
MCV: 88 fL (ref 78.0–100.0)
Platelets: 149 10*3/uL — ABNORMAL LOW (ref 150–400)
RBC: 3.76 MIL/uL — ABNORMAL LOW (ref 3.87–5.11)
RDW: 15.6 % — ABNORMAL HIGH (ref 11.5–15.5)
WBC: 7 10*3/uL (ref 4.0–10.5)

## 2018-03-08 LAB — MAGNESIUM: Magnesium: 2 mg/dL (ref 1.7–2.4)

## 2018-03-08 SURGERY — VISCERAL ANGIOGRAPHY
Anesthesia: LOCAL

## 2018-03-08 MED ORDER — SODIUM CHLORIDE 0.9% FLUSH: 3.0000 mL | INTRAVENOUS | Status: DC | PRN

## 2018-03-08 MED ORDER — LABETALOL HCL 5 MG/ML IV SOLN: 10.0000 mg | INTRAVENOUS | Status: DC | PRN

## 2018-03-08 MED ORDER — MORPHINE SULFATE (PF) 2 MG/ML IV SOLN
2.0000 mg | Freq: Once | INTRAVENOUS | Status: AC
  Administered 2018-03-08: 2 mg via INTRAVENOUS
  Filled 2018-03-08: qty 1

## 2018-03-08 MED ORDER — HYDRALAZINE HCL 20 MG/ML IJ SOLN: 5.0000 mg | INTRAMUSCULAR | Status: DC | PRN

## 2018-03-08 MED ORDER — SODIUM CHLORIDE 0.9% FLUSH
3.0000 mL | Freq: Two times a day (BID) | INTRAVENOUS | Status: DC
  Administered 2018-03-09 – 2018-03-26 (×22): 3 mL via INTRAVENOUS

## 2018-03-08 MED ORDER — SODIUM CHLORIDE 0.9 % IV SOLN
INTRAVENOUS | Status: DC
  Administered 2018-03-08 – 2018-03-13 (×6): via INTRAVENOUS

## 2018-03-08 MED ORDER — SCOPOLAMINE 1 MG/3DAYS TD PT72
1.0000 | MEDICATED_PATCH | TRANSDERMAL | Status: DC
  Administered 2018-03-08 – 2018-03-11 (×2): 1.5 mg via TRANSDERMAL
  Filled 2018-03-08 (×2): qty 1

## 2018-03-08 MED ORDER — HEPARIN SODIUM (PORCINE) 1000 UNIT/ML IJ SOLN
INTRAMUSCULAR | Status: DC | PRN
  Administered 2018-03-08: 8000 [IU] via INTRAVENOUS

## 2018-03-08 MED ORDER — HEPARIN (PORCINE) IN NACL 2-0.9 UNIT/ML-% IJ SOLN
INTRAMUSCULAR | Status: AC
  Filled 2018-03-08: qty 1000

## 2018-03-08 MED ORDER — LIDOCAINE HCL (PF) 1 % IJ SOLN
INTRAMUSCULAR | Status: DC | PRN
  Administered 2018-03-08: 20 mL

## 2018-03-08 MED ORDER — SODIUM CHLORIDE 0.9 % WEIGHT BASED INFUSION: 1.0000 mL/kg/h | INTRAVENOUS | Status: AC

## 2018-03-08 MED ORDER — SODIUM CHLORIDE 0.9 % IV SOLN: 250.0000 mL | INTRAVENOUS | Status: DC | PRN

## 2018-03-08 MED ORDER — PROMETHAZINE HCL 25 MG/ML IJ SOLN
12.5000 mg | Freq: Once | INTRAMUSCULAR | Status: AC
  Administered 2018-03-08: 12.5 mg via INTRAVENOUS
  Filled 2018-03-08: qty 1

## 2018-03-08 MED ORDER — IODIXANOL 320 MG/ML IV SOLN
INTRAVENOUS | Status: DC | PRN
Start: 2018-03-08 — End: 2018-03-08
  Administered 2018-03-08: 80 mL via INTRA_ARTERIAL

## 2018-03-08 MED ORDER — MIDAZOLAM HCL 2 MG/2ML IJ SOLN
INTRAMUSCULAR | Status: AC
  Filled 2018-03-08: qty 2

## 2018-03-08 MED ORDER — MORPHINE SULFATE (PF) 2 MG/ML IV SOLN
2.0000 mg | INTRAVENOUS | Status: DC | PRN
  Administered 2018-03-08 – 2018-03-15 (×12): 2 mg via INTRAVENOUS
  Filled 2018-03-08 (×12): qty 1

## 2018-03-08 MED ORDER — LORAZEPAM 2 MG/ML IJ SOLN
1.0000 mg | Freq: Once | INTRAMUSCULAR | Status: AC
  Administered 2018-03-08: 1 mg via INTRAVENOUS
  Filled 2018-03-08: qty 1

## 2018-03-08 MED ORDER — FENTANYL CITRATE (PF) 100 MCG/2ML IJ SOLN
INTRAMUSCULAR | Status: AC
  Filled 2018-03-08: qty 2

## 2018-03-08 MED ORDER — LIDOCAINE HCL 1 % IJ SOLN
INTRAMUSCULAR | Status: AC
  Filled 2018-03-08: qty 20

## 2018-03-08 MED ORDER — MIDAZOLAM HCL 2 MG/2ML IJ SOLN
INTRAMUSCULAR | Status: DC | PRN
  Administered 2018-03-08 (×2): 1 mg via INTRAVENOUS

## 2018-03-08 MED ORDER — HEPARIN SODIUM (PORCINE) 1000 UNIT/ML IJ SOLN
INTRAMUSCULAR | Status: AC
  Filled 2018-03-08: qty 1

## 2018-03-08 MED ORDER — FENTANYL CITRATE (PF) 100 MCG/2ML IJ SOLN
INTRAMUSCULAR | Status: DC | PRN
  Administered 2018-03-08 (×3): 25 ug via INTRAVENOUS

## 2018-03-08 MED ORDER — ONDANSETRON HCL 4 MG/2ML IJ SOLN
INTRAMUSCULAR | Status: DC | PRN
  Administered 2018-03-08: 4 mg via INTRAVENOUS

## 2018-03-08 MED ORDER — ACETAMINOPHEN 325 MG PO TABS: 650.0000 mg | ORAL_TABLET | ORAL | Status: DC | PRN

## 2018-03-08 MED ORDER — CLOPIDOGREL BISULFATE 300 MG PO TABS
ORAL_TABLET | ORAL | Status: AC
  Filled 2018-03-08: qty 1

## 2018-03-08 MED ORDER — CLOPIDOGREL BISULFATE 300 MG PO TABS
ORAL_TABLET | ORAL | Status: DC | PRN
  Administered 2018-03-08: 300 mg via ORAL

## 2018-03-08 MED ORDER — ONDANSETRON HCL 4 MG/2ML IJ SOLN
INTRAMUSCULAR | Status: AC
  Filled 2018-03-08: qty 2

## 2018-03-08 MED ORDER — ONDANSETRON HCL 4 MG/2ML IJ SOLN: 4.0000 mg | Freq: Four times a day (QID) | INTRAMUSCULAR | Status: DC | PRN

## 2018-03-08 MED ORDER — CLOPIDOGREL BISULFATE 75 MG PO TABS
75.0000 mg | ORAL_TABLET | Freq: Every day | ORAL | Status: DC
  Administered 2018-03-09 – 2018-03-13 (×5): 75 mg via ORAL
  Filled 2018-03-08 (×6): qty 1

## 2018-03-08 MED ORDER — OXYCODONE HCL 5 MG PO TABS
5.0000 mg | ORAL_TABLET | ORAL | Status: DC | PRN
  Administered 2018-03-09 – 2018-03-13 (×14): 10 mg via ORAL
  Administered 2018-03-14 – 2018-03-15 (×2): 5 mg via ORAL
  Administered 2018-03-16: 10 mg via ORAL
  Administered 2018-03-16 (×2): 5 mg via ORAL
  Administered 2018-03-16 – 2018-03-26 (×35): 10 mg via ORAL
  Filled 2018-03-08: qty 1
  Filled 2018-03-08 (×5): qty 2
  Filled 2018-03-08: qty 1
  Filled 2018-03-08 (×2): qty 2
  Filled 2018-03-08: qty 1
  Filled 2018-03-08 (×15): qty 2
  Filled 2018-03-08: qty 1
  Filled 2018-03-08 (×13): qty 2
  Filled 2018-03-08: qty 1
  Filled 2018-03-08 (×8): qty 2
  Filled 2018-03-08: qty 1
  Filled 2018-03-08 (×8): qty 2

## 2018-03-08 MED FILL — Lidocaine HCl Local Inj 1%: INTRAMUSCULAR | Qty: 20 | Status: AC

## 2018-03-08 SURGICAL SUPPLY — 14 items
BALLN MUSTANG 5.0X20 75 (BALLOONS) ×3
BALLN MUSTANG 8X20X75 (BALLOONS) ×3
BALLOON MUSTANG 5.0X20 75 (BALLOONS) IMPLANT
BALLOON MUSTANG 8X20X75 (BALLOONS) IMPLANT
DEVICE CLOSURE MYNXGRIP 6/7F (Vascular Products) ×1 IMPLANT
GLIDEWIRE ADV .035X260CM (WIRE) ×1 IMPLANT
KIT ENCORE 26 ADVANTAGE (KITS) ×1 IMPLANT
KIT MICROPUNCTURE NIT STIFF (SHEATH) ×1 IMPLANT
KIT PV (KITS) ×3 IMPLANT
SHEATH AVANTI 11CM 7FR (SHEATH) ×1 IMPLANT
STENT VIABAHN VBX 6X39X135 (Permanent Stent) ×1 IMPLANT
TRANSDUCER W/STOPCOCK (MISCELLANEOUS) ×3 IMPLANT
TRAY PV CATH (CUSTOM PROCEDURE TRAY) ×3 IMPLANT
WIRE BENTSON .035X145CM (WIRE) ×1 IMPLANT

## 2018-03-08 NOTE — Progress Notes (Signed)
PT Cancellation Note  Patient Details Name: [name] MRN: [id number] DOB: February 08, 1941   Cancelled Treatment:    Reason Eval/Treat Not Completed: Patient at procedure or test/unavailable(Pt off unit for reattemp at SMA stent.  Will cancel therapy at this time and follow up per POC.  )   [name] [name] 03/08/2018, 1:31 PM  [name], PTA pager [phone]

## 2018-03-08 NOTE — Progress Notes (Signed)
PT Cancellation Note  Patient Details Name: [name] MRN: [id number] DOB: [birth year]   Cancelled Treatment:    Reason Eval/Treat Not Completed: (P) Medical issues which prohibited therapy(Pt vomitting at  bedside post stent, will cancel treatment at this time and resume per POC.  )   [name] 03/08/2018, 3:17 PM  [name], PTA pager [phone]

## 2018-03-08 NOTE — Progress Notes (Signed)
ANTICOAGULATION CONSULT NOTE - Follow Up Consult  Pharmacy Consult for Heparin Indication: DVT, PE  Allergies  Allergen Reactions  . Zolpidem Tartrate Anxiety    CONFUSION   . Pitavastatin Other (See Comments)    Not sure  . Ropinirole Nausea Only    Makes legs jump  . Penicillins Rash   Patient Measurements: Height: 5\' 8"  (172.7 cm) Weight: 182 lb 6.4 oz (82.7 kg)(scale a) IBW/kg (Calculated) : 63.9 Heparin Dosing Weight: 77.8 kg  Vital Signs: Temp: 98.4 F (36.9 C) (03/18 0438) Temp Source: Oral (03/18 0438) BP: 132/57 (03/18 0438) Pulse Rate: 66 (03/18 0438)  Labs: Recent Labs    03/06/18 0439  03/06/18 1603 03/07/18 0515 03/08/18 0332  HGB 11.6*  --   --  10.0* 10.5*  HCT 36.3  --   --  31.7* 33.1*  PLT 154  --   --  144* 149*  HEPARINUNFRC  --    < > 0.15* 0.39 0.63  CREATININE 0.61  --   --  0.76 0.83   < > = values in this interval not displayed.   Estimated Creatinine Clearance: 64 mL/min (by C-G formula based on SCr of 0.83 mg/dL).  Assessment: 17 yoF presents with acute vs. chronic PE with R heart strain, also with LLE DVT. She has a h/o DVTs on Xarelto last year, but this was discontinued due to GIB.  Plan is to resume rivaroxaban after procedure on Monday.    Heparin level therapeutic CBC stable  Goal of Therapy:  Heparin level 0.3-0.7 units/ml Monitor platelets by anticoagulation protocol: Yes   Plan:  Continue heparin at 1400 units/hr Follow up after SMA stent today Monitor s/sx of bleeding  Thank you Anette Guarneri, PharmD [phone] 03/08/2018,10:09 AM

## 2018-03-08 NOTE — Plan of Care (Signed)
  Education: Knowledge of General Education information will improve 03/08/2018 0131 - Progressing by Theador Hawthorne, RN   Nutrition: Adequate nutrition will be maintained 03/08/2018 0131 - Progressing by Theador Hawthorne, RN   Nutrition: Adequate nutrition will be maintained 03/08/2018 0131 - Progressing by Theador Hawthorne, RN   Elimination: Will not experience complications related to bowel motility 03/08/2018 0131 - Progressing by Theador Hawthorne, RN   Elimination: Will not experience complications related to urinary retention 03/08/2018 0131 - Progressing by Theador Hawthorne, RN

## 2018-03-08 NOTE — Op Note (Signed)
    Patient name: [name] MRN: [id number] DOB: 01/19/1941 Sex: female  03/08/2018 Pre-operative Diagnosis: chronic mesenteric ischemia Post-operative diagnosis:  Same Surgeon:  Erlene Quan C. Donzetta Matters, MD Procedure Performed: 1.  US guided cannulation of right common femoral artery 2.  Stent of sma with 6 x 29mm vbx 3.  Moderate sedation with fentanyl and versed for 67 4.  Percutaneous closure of right common femoral artery with mynx  Indications: 77 year old female has history of chronic mesenteric ischemia.  She is undergone attempted revascularization with SMA stenting which was unsuccessful 2 days prior to this procedure.  She is now indicated for repeat attempt.  Findings: The SMA is heavily calcified proximally.  Distally there appears to be adequate flow.  A 6 x 39 cm stent was placed and resolved a nearly occlusive 90% lesion to 0% residual stenosis without flow-limiting dissection.   Procedure:  The patient was identified in the holding area and taken to room 8.  The patient was then placed supine on the table and prepped and draped in the usual sterile fashion.  A time out was called.  Ultrasound was used to evaluate the right common femoral artery.  It was patent .  A digital ultrasound image was acquired.  A micropuncture needle was used to access the right common femoral artery under ultrasound guidance.  An 018 wire was advanced without resistance and a micropuncture sheath was placed.  The 018 wire was removed and a benson wire was placed followed by a Glidewire advantage given the tortuosity in the iliacs.  I then exchanged for the 7 French Oscor sheath and the patient was heparinized.  We used the Oscor and the Glidewire to engage the SMA.  We confirmed we were within the SMA with angiogram from the sheath.  We then predilated the lesion with 5 mm balloon.  Unfortunately with removing the balloon we lost the wire we had to recannulate.  We then able to get the 5 mm balloon further down  and predilated to nominal.  We then exchanged for the 6 mm balloon and on first pass again we lost access.  We then got the Glidewire advantage further out into the mesentery were able to get the stent in place.  It was inflated to nominal pressure.  Completion angiogram there was heavy calcification we cannot necessarily see.  We then postdilated the proximal area of the stent with an 8 x 2 mm balloon to nominal pressure.  Completion angiogram now demonstrated brisk flow through the stent with no residual stenosis identified.  We then exchanged for a Bentson wire and placed a short 7 French sheath and a minx device was deployed.  Patient tolerated procedure well without immediate comp occasion.  Contrast: 80cc   Keno Caraway C. Donzetta Matters, MD [facility] Office: [phone] Pager: [phone]

## 2018-03-08 NOTE — Progress Notes (Signed)
Nutrition Follow-up  DOCUMENTATION CODES:   Not applicable  INTERVENTION:   -Continue Boost Breeze po TID, each supplement provides 250 kcal and 9 grams of protein  NUTRITION DIAGNOSIS:   Increased nutrient needs related to chronic illness as evidenced by estimated needs.  Ongoing  GOAL:   Patient will meet greater than or equal to 90% of their needs  Progressing  MONITOR:   PO intake, Supplement acceptance, Labs, Weight trends, Skin, I & O's  REASON FOR ASSESSMENT:   Consult Assessment of nutrition requirement/status  ASSESSMENT:   [name] is a 77 y.o. female with medical history significant of CAD s/p CABG 2233, diastolic CHF with EF 61%, COPD currently on PRN O2 at home, prior DVT not currently anticoagulated due to GIB last year.  Patient presents to the ED with c/o 2-3 weeks of nausea, generalized weakness, SOB on exertion, needing to use more O2 at home on exertion than baseline.  3/15- s/p Procedure: Abdominal aortogram selective superior mesenteric angiogram attempted superior mesenteric artery stent 3/18- s/p procedure: US guided cannulation of right common femoral artery, Stent of sma with 6 x 49mm vbx, Percutaneous closure of right common femoral artery with mynx  Pt out of room at time of visit; currently undergoing SMA stenting with vascular surgery.   Pt with good appetite; noted meal completion 50-100%. Pt also continues to take [employer] supplement well.  Labs reviewed.   Diet Order:  No diet orders on file  EDUCATION NEEDS:   Education needs have been addressed  Skin:  Skin Assessment: Reviewed RN Assessment  Last BM:  03/07/18  Height:   Ht Readings from Last 1 Encounters:  03/01/18 5\' 8"  (1.727 m)    Weight:   Wt Readings from Last 1 Encounters:  03/08/18 182 lb 6.4 oz (82.7 kg)    Ideal Body Weight:  63.6 kg  BMI:  Body mass index is 27.73 kg/m.  Estimated Nutritional Needs:   Kcal:  1700-1900  Protein:  85-100  grams  Fluid:  1.7-1.9 L    Clydell Sposito A. Jimmye Norman, RD, LDN, CDE Pager: [phone] After hours Pager: [phone]

## 2018-03-08 NOTE — Progress Notes (Signed)
Patient still complaining of pain 8/10 after 2mg  of morphine. Dr Donnetta Hutching on call for vascular paged for additional intervention. MD to call back after reviewing patient's chart. Will continue to monitor patient.

## 2018-03-08 NOTE — Progress Notes (Signed)
Order for 12.5 of phenergan and additional 2mg  of IV morphine. Stat abdominal x-ray ordered. Order initiated. Will continue to monitor patient and update provider as needed.

## 2018-03-08 NOTE — Progress Notes (Signed)
Pt still vomiting after scopolamine patch. MD paged.

## 2018-03-08 NOTE — Progress Notes (Signed)
PROGRESS NOTE    [name]  KDX:[id number] DOB: 1941/04/21 DOA: 02/28/2018 PCP: [name], MD     Brief Narrative:  [name] is a 77 year old female with past medical history of CAD, PVD and COPD who is had worsening left hip issues that have gotten to the point where she has not ambulated much over the past month and presented to the emergency room on 3/10 with complaints of nausea, weakness and dyspnea on exertion and found to be in acute respiratory failure with hypoxia. Workup discovered multiple subacute pulmonary embolus on CT scan as well as a large acute DVT in her left femoral vein. 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 Patient started on heparin and admitted to the hospitalist service. She was evaluated by vascular surgery in regards to her questionable mesenteric artery stenosis.  She underwent mesenteric angiogram on 3/15, however was a unable to stent SMA.  Plans are for reattempt today.  Assessment & Plan:   Principal Problem:   Pulmonary embolism (HCC) Active Problems:   Hypothyroidism   Essential hypertension   COPD (chronic obstructive pulmonary disease) (HCC)   CAD (coronary artery disease)   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HCC)   Acute on chronic respiratory failure with hypoxia (HCC)   PE and left femoral DVT -Patient with hx of DVT post-operation 15 years ago. She was on coumadin at that time, then it was stopped due to GI bleed  -Patient has not been ambulatory due to chronic hip pain -Echo without right heart strain  -Continue IV heparin. Plan to start xarelto after re-attempt at SMA stent  -She has not had any report of GI bleeding while on IV heparin   Superior mesenteric artery stenosis -Vascular surgery consulted -Angiogram 3/15 but unable to place stent, plan for re-attempt today   Essential hypertension -Stable  COPD -Stable without exacerbation  Left hip pain secondary to degenerative joint  disease -Pain control, follow-up with orthopedic surgery as outpatient   DVT prophylaxis: Heparin gtt Code Status: Full Family Communication: No family at bedside  Disposition Plan: Pending re-attempt SMA stent today    Consultants:   Vascular surgery  Procedures:   Angiogram 3/15   Antimicrobials:  Anti-infectives (From admission, onward)   None       Subjective: Continues to have nausea, sick feeling in her stomach without overt pain.  No vomiting.  Last bowel movement 2 days ago.  Objective: Vitals:   03/07/18 1230 03/07/18 1931 03/08/18 0438 03/08/18 0927  BP: (!) 111/49 123/71 (!) 132/57   Pulse: 83 68 66   Resp: 20 20 20    Temp: 98 F (36.7 C) 98.1 F (36.7 C) 98.4 F (36.9 C)   TempSrc: Oral Oral Oral   SpO2: 100% 96% 99% 99%  Weight:   82.7 kg (182 lb 6.4 oz)   Height:        Intake/Output Summary (Last 24 hours) at 03/08/2018 1110 Last data filed at 03/07/2018 2225 Gross per 24 hour  Intake 635.62 ml  Output 800 ml  Net -164.38 ml   Filed Weights   03/06/18 0657 03/07/18 0622 03/08/18 0438  Weight: 80.1 kg (176 lb 9.4 oz) 81.5 kg (179 lb 9.6 oz) 82.7 kg (182 lb 6.4 oz)    Examination: General exam: Appears calm and comfortable  Respiratory system: Clear to auscultation. Respiratory effort normal. Cardiovascular system: S1 & S2 heard, RRR. No JVD, murmurs, rubs, gallops or clicks. No pedal edema. Gastrointestinal system: Abdomen  is nondistended, soft and nontender. No organomegaly or masses felt. Normal bowel sounds heard. Central nervous system: Alert and oriented. No focal neurological deficits. Extremities: Symmetric  Skin: No rashes, lesions or ulcers Psychiatry: Judgement and insight appear normal. Mood & affect appropriate.    Data Reviewed: I have personally reviewed following labs and imaging studies  CBC: Recent Labs  Lab 03/04/18 0725 03/05/18 0724 03/06/18 0439 03/07/18 0515 03/08/18 0332  WBC 8.4 8.1 7.9 7.1 7.0  HGB 12.2  11.8* 11.6* 10.0* 10.5*  HCT 39.0 37.4 36.3 31.7* 33.1*  MCV 87.6 88.6 87.5 87.1 88.0  PLT 180 165 154 144* 644*   Basic Metabolic Panel: Recent Labs  Lab 03/01/18 1204 03/02/18 0413 03/05/18 0724 03/06/18 0439 03/07/18 0515 03/08/18 0332  NA 141 142 143 141 140 138  K 3.0* 3.7 3.5 3.4* 3.4* 3.9  CL 103 105 108 108 109 108  CO2 30 27 28 23 24 24   GLUCOSE 106* 89 102* 92 98 103*  BUN 11 10 9 7 11 13   CREATININE 0.89 0.82 0.81 0.61 0.76 0.83  CALCIUM 8.6* 8.9 9.5 8.8* 8.9 9.1  MG 1.5*  --   --   --  1.4* 2.0   GFR: Estimated Creatinine Clearance: 64 mL/min (by C-G formula based on SCr of 0.83 mg/dL). Liver Function Tests: No results for input(s): AST, ALT, ALKPHOS, BILITOT, PROT, ALBUMIN in the last 168 hours. No results for input(s): LIPASE, AMYLASE in the last 168 hours. No results for input(s): AMMONIA in the last 168 hours. Coagulation Profile: No results for input(s): INR, PROTIME in the last 168 hours. Cardiac Enzymes: No results for input(s): CKTOTAL, CKMB, CKMBINDEX, TROPONINI in the last 168 hours. BNP (last 3 results) Recent Labs    08/17/17 1038 09/14/17 0921  PROBNP 612 503   HbA1C: No results for input(s): HGBA1C in the last 72 hours. CBG: No results for input(s): GLUCAP in the last 168 hours. Lipid Profile: No results for input(s): CHOL, HDL, LDLCALC, TRIG, CHOLHDL, LDLDIRECT in the last 72 hours. Thyroid Function Tests: No results for input(s): TSH, T4TOTAL, FREET4, T3FREE, THYROIDAB in the last 72 hours. Anemia Panel: No results for input(s): VITAMINB12, FOLATE, FERRITIN, TIBC, IRON, RETICCTPCT in the last 72 hours. Sepsis Labs: No results for input(s): PROCALCITON, LATICACIDVEN in the last 168 hours.  Recent Results (from the past 240 hour(s))  Surgical pcr screen     Status: None   Collection Time: 03/04/18  9:04 PM  Result Value Ref Range Status   MRSA, PCR NEGATIVE NEGATIVE Final   Staphylococcus aureus NEGATIVE NEGATIVE Final    Comment:  (NOTE) The Xpert SA Assay (FDA approved for NASAL specimens in patients 11 years of age and older), is one component of a comprehensive surveillance program. It is not intended to diagnose infection nor to guide or monitor treatment. Performed at [facility], [place] [address], [place], [place] [address]        Radiology Studies: No results found.    Scheduled Meds: . allopurinol  100 mg Oral Daily  . cholecalciferol  1,000 Units Oral Daily  . citalopram  20 mg Oral TID  . feeding supplement  1 Container Oral TID BM  . ferrous sulfate  325 mg Oral Q breakfast  . lidocaine  1 patch Transdermal Q24H  . pantoprazole  80 mg Oral Daily  . pilocarpine  1 drop Both Eyes BID  . pramipexole  0.25 mg Oral QHS  . roflumilast  500 mcg Oral  Daily  . sodium chloride flush  3 mL Intravenous Q12H  . traZODone  150 mg Oral QHS  . umeclidinium bromide  1 puff Inhalation Daily   Continuous Infusions: . sodium chloride    . sodium chloride 100 mL/hr at 03/08/18 0834  . heparin 1,400 Units/hr (03/08/18 0041)     LOS: 7 days    Time spent: 20 minutes   Dessa Phi, DO [facility] [email or web address] Password [email or web address] 03/08/2018, 11:10 AM

## 2018-03-08 NOTE — Progress Notes (Signed)
Patient still having persistent episodes of nausea and vomitting after 4mg  of Zofran and 2mg  of morphine IV  For pain. MD paged for further intervention. Awaiting reply. Will continue to monitor patient.

## 2018-03-08 NOTE — Progress Notes (Signed)
  Progress Note    03/08/2018 12:42 PM Day of Surgery  Subjective:  No acute issues  Vitals:   03/08/18 1146 03/08/18 1241  BP: (!) 124/44   Pulse: 63   Resp:    Temp:    SpO2: 96% 100%    Physical Exam: aaox3 Abdomen is soft   CBC    Component Value Date/Time   WBC 7.0 03/08/2018 0332   RBC 3.76 (L) 03/08/2018 0332   HGB 10.5 (L) 03/08/2018 0332   HGB 12.9 08/17/2017 1038   HCT 33.1 (L) 03/08/2018 0332   HCT 38.2 08/17/2017 1038   PLT 149 (L) 03/08/2018 0332   PLT 237 08/17/2017 1038   MCV 88.0 03/08/2018 0332   MCV 88 08/17/2017 1038   MCH 27.9 03/08/2018 0332   MCHC 31.7 03/08/2018 0332   RDW 15.6 (H) 03/08/2018 0332   RDW 15.7 (H) 08/17/2017 1038   LYMPHSABS 1.9 04/26/2014 1237   MONOABS 0.8 04/26/2014 1237   EOSABS 0.2 04/26/2014 1237   BASOSABS 0.1 04/26/2014 1237    BMET    Component Value Date/Time   NA 138 03/08/2018 0332   NA 144 09/14/2017 0921   K 3.9 03/08/2018 0332   CL 108 03/08/2018 0332   CO2 24 03/08/2018 0332   GLUCOSE 103 (H) 03/08/2018 0332   BUN 13 03/08/2018 0332   BUN 12 09/14/2017 0921   CREATININE 0.83 03/08/2018 0332   CALCIUM 9.1 03/08/2018 0332   GFRNONAA >60 03/08/2018 0332   GFRAA >60 03/08/2018 0332    INR    Component Value Date/Time   INR 1.16 02/13/2017 1148     Intake/Output Summary (Last 24 hours) at 03/08/2018 1242 Last data filed at 03/07/2018 2225 Gross per 24 hour  Intake 635.62 ml  Output 800 ml  Net -164.38 ml     Assessment/plan  77 y.o. female with cmi. Attempt at sma stenting today  Treyshon Buchanon C. Donzetta Matters, MD [facility] Office: [phone] Pager: [phone]  03/08/2018 12:42 PM

## 2018-03-09 ENCOUNTER — Inpatient Hospital Stay (HOSPITAL_COMMUNITY): Payer: Medicare HMO

## 2018-03-09 ENCOUNTER — Other Ambulatory Visit: Payer: Self-pay | Admitting: *Deleted

## 2018-03-09 LAB — BASIC METABOLIC PANEL
Anion gap: 9 (ref 5–15)
BUN: 16 mg/dL (ref 6–20)
CO2: 20 mmol/L — ABNORMAL LOW (ref 22–32)
Calcium: 8.8 mg/dL — ABNORMAL LOW (ref 8.9–10.3)
Chloride: 110 mmol/L (ref 101–111)
Creatinine, Ser: 0.93 mg/dL (ref 0.44–1.00)
GFR calc Af Amer: >60 mL/min (ref >60)
GFR calc non Af Amer: 58 mL/min — ABNORMAL LOW (ref >60)
Glucose, Bld: 163 mg/dL — ABNORMAL HIGH (ref 65–99)
Potassium: 4.1 mmol/L (ref 3.5–5.1)
Sodium: 139 mmol/L (ref 135–145)

## 2018-03-09 LAB — HEPARIN LEVEL (UNFRACTIONATED): Heparin Unfractionated: 0.54 IU/mL (ref 0.30–0.70)

## 2018-03-09 MED ORDER — IOPAMIDOL (ISOVUE-370) INJECTION 76%
INTRAVENOUS | Status: AC
  Filled 2018-03-09: qty 100

## 2018-03-09 MED ORDER — HEPARIN (PORCINE) IN NACL 100-0.45 UNIT/ML-% IJ SOLN
1250.0000 [IU]/h | INTRAMUSCULAR | Status: DC
  Administered 2018-03-09 – 2018-03-10 (×2): 1400 [IU]/h via INTRAVENOUS
  Administered 2018-03-11 – 2018-03-12 (×3): 1200 [IU]/h via INTRAVENOUS
  Filled 2018-03-09 (×7): qty 250

## 2018-03-09 MED ORDER — PROMETHAZINE HCL 25 MG/ML IJ SOLN
12.5000 mg | Freq: Four times a day (QID) | INTRAMUSCULAR | Status: DC | PRN
  Administered 2018-03-11: 12.5 mg via INTRAVENOUS
  Filled 2018-03-09: qty 1

## 2018-03-09 MED FILL — Lidocaine HCl Local Inj 1%: INTRAMUSCULAR | Qty: 20 | Status: AC

## 2018-03-09 MED FILL — Heparin Sodium (Porcine) 2 Unit/ML in Sodium Chloride 0.9%: INTRAMUSCULAR | Qty: 1000 | Status: AC

## 2018-03-09 NOTE — Progress Notes (Signed)
Patient ID: [name], female   DOB: 05-16-1941, 77 y.o.   MRN: [id number] Just back from CT scan Still with nausea but appears more comfortable than earlier. No shake or rebound tenderness in her abdomen  CT scan reviewed.  SMA stent widely patent with no thrombus seen.  No evidence of extravasation and no evidence of right groin or retroperitoneal bleed  Discussed with patient.  Explained will await official radiology report but I do not see any concern regarding SMA stenting done earlier.

## 2018-03-09 NOTE — Progress Notes (Signed)
Patient ID: [name], female   DOB: 06/11/1941, 77 y.o.   MRN: [id number] Called regarding concern of abdominal pain.  She was reporting increasing abdominal pain despite narcotic pain medication.  She tells me that she has had abdominal pain for several weeks which is why she was in the hospital.  She reports that this is similar but worse than typical.  She has nausea which she reports is been present since this initial episode began.  Not vomiting at present.  BP (!) 152/75 (BP Location: Left Arm)   Pulse (!) 114   Temp (!) 97.4 F (36.3 C) (Oral)   Resp 18   Ht 5\' 8"  (1.727 m)   Wt 182 lb 6.4 oz (82.7 kg) Comment: scale a  SpO2 96%   BMI 27.73 kg/m    Abdomen soft.  Mild diffuse tenderness.  No rebound.  Reports some mild soreness in her right groin but no fullness or evidence of hematoma.  2+ popliteal pulses bilaterally  Plain film of her abdomen unremarkable  I reviewed her arteriogram from earlier yesterday.  This showed a high-grade stenosis of her spare mesenteric artery with accessible stenting of this.  Discussed concerns with patient.  Could be related to reperfusion versus stent thrombosis versus retroperitoneal bleed.  Will obtain stat CT of her abdomen and pelvis.  She received 80 cc with her arteriogram yesterday and has normal renal function.

## 2018-03-09 NOTE — Progress Notes (Signed)
Physical Therapy Treatment Patient Details Name: [name] MRN: [id number] DOB: Jan 07, 1941 Today's Date: 03/09/2018    History of Present Illness 77 year old female with past medical history of R THA, B TKA, B TSA, back surgery, neck surgery, CAD, PVD and COPD who is had worsening left hip issues that have gotten to the point where she does not ambulate much over the past month and presented to the emergency room on 3/10 with complaints of nausea, weakness and dyspnea on exertion and found to be in acute respiratory failure with hypoxia. Workup discovered multiplesubacute pulmonary embolus on CT scan. 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    PT Comments    Pt performed decreased activity after procedure and multiple days in bed due to nausea and vomitting.  Pt is requiring increased assistance and will benefit from HHPT at d/c to improve strength and function.  Will inform supervising PT of need for change in recommendations at this time.      Follow Up Recommendations  Home health PT     Equipment Recommendations  None recommended by PT    Recommendations for Other Services       Precautions / Restrictions Precautions Precautions: None Precaution Comments: pt denies h/o falls in past 1 year Restrictions Weight Bearing Restrictions: No    Mobility  Bed Mobility Overal bed mobility: Needs Assistance Bed Mobility: Sit to Supine       Sit to supine: Min assist   General bed mobility comments: Pt sitting on edge of bed on arrival.  To return to supine she required min assistance due to LLE pain.  Assisted patient in positioning upright with LLE elevated.    Transfers Overall transfer level: Needs assistance Equipment used: Rolling walker (2 wheeled) Transfers: Sit to/from Stand Sit to Stand: Min guard         General transfer comment: Cues for hand placement, patient unsteady in standing and min guard for safety.     Ambulation/Gait Ambulation/Gait assistance: Min guard Ambulation Distance (Feet): 60 Feet Assistive device: Rolling walker (2 wheeled) Gait Pattern/deviations: Step-through pattern;Drifts right/left;Antalgic;Shuffle;Trunk flexed   Gait velocity interpretation: Below normal speed for age/gender General Gait Details: Pt remains limited due to abdominal and chronic L hip pain.  HR elevated to 130 bpm during gait training. SPO2 92% on RA.  Pt required cues for upper trunk control and safety with stance in RW particularly with turns and backing.     Stairs            Wheelchair Mobility    Modified Rankin (Stroke Patients Only)       Balance Overall balance assessment: Needs assistance   Sitting balance-Leahy Scale: Good       Standing balance-Leahy Scale: Fair                              Cognition Arousal/Alertness: Awake/alert Behavior During Therapy: WFL for tasks assessed/performed Overall Cognitive Status: Within Functional Limits for tasks assessed                                        Exercises      General Comments        Pertinent Vitals/Pain Pain Assessment: Faces Faces Pain Scale: Hurts even more Pain Location: L hip (chronic OA pain per pt) Pain Descriptors / Indicators:  Sore Pain Intervention(s): Monitored during session;Repositioned    Home Living                      Prior Function            PT Goals (current goals can now be found in the care plan section) Acute Rehab PT Goals Patient Stated Goal: return to independence with mobility Potential to Achieve Goals: Good Progress towards PT goals: Progressing toward goals    Frequency    Min 3X/week      PT Plan Current plan remains appropriate    Co-evaluation              AM-PAC PT "6 Clicks" Daily Activity  Outcome Measure  Difficulty turning over in bed (including adjusting bedclothes, sheets and blankets)?: Unable Difficulty  moving from lying on back to sitting on the side of the bed? : Unable Difficulty sitting down on and standing up from a chair with arms (e.g., wheelchair, bedside commode, etc,.)?: Unable Help needed moving to and from a bed to chair (including a wheelchair)?: A Little Help needed walking in hospital room?: A Little Help needed climbing 3-5 steps with a railing? : A Little 6 Click Score: 12    End of Session Equipment Utilized During Treatment: Gait belt Activity Tolerance: Patient limited by fatigue;Patient limited by pain(limited due to elevated HR.  ) Patient left: with call bell/phone within reach;in bed;with bed alarm set Nurse Communication: Mobility status PT Visit Diagnosis: Difficulty in walking, not elsewhere classified (R26.2)     Time: 8329-1916 PT Time Calculation (min) (ACUTE ONLY): 11 min  Charges:  $Gait Training: 8-22 mins                    G Codes:       [name], PTA pager [place] 03/09/2018, 5:29 PM

## 2018-03-09 NOTE — Plan of Care (Signed)
  Education: Knowledge of General Education information will improve 03/09/2018 0009 - Progressing by Theador Hawthorne, RN   Elimination: Will not experience complications related to bowel motility 03/09/2018 0009 - Progressing by Theador Hawthorne, RN   Elimination: Will not experience complications related to urinary retention 03/09/2018 0009 - Progressing by Theador Hawthorne, RN

## 2018-03-09 NOTE — Patient Outreach (Signed)
[facility]) Care Management  03/09/2018  [name] 1941-03-16 [id number]   RN Health Coach Hospitalization  Referral Date: 02/23/2017 Referral Source: EMMI COPD program/Hospital admission Reason for Referral: EMMI COPD alert Insurance: Medicare   Outreach Attempt:  Patient continues to be hospitalized.  Hospital Liaison continues to follow for discharge needs.  Patient will transition to Transition of Care program upon discharge.  Plan:  RN Health Coach will close patient to health coach program.  [place] will send primary care provider discipline closure letter.  Hospital Liaison will refer to Transitions of Care.  [place] [phone] [name]

## 2018-03-09 NOTE — Progress Notes (Signed)
ANTICOAGULATION CONSULT NOTE - Follow Up Consult  Pharmacy Consult for Heparin Indication: DVT, PE  Allergies  Allergen Reactions  . Zolpidem Tartrate Anxiety    CONFUSION   . Pitavastatin Other (See Comments)    Not sure  . Ropinirole Nausea Only    Makes legs jump  . Penicillins Rash   Patient Measurements: Height: 5\' 8"  (172.7 cm) Weight: 180 lb 3.2 oz (81.7 kg)(scale a) IBW/kg (Calculated) : 63.9 Heparin Dosing Weight: 77.8 kg  Vital Signs: Temp: 98.6 F (37 C) (03/19 1201) Temp Source: Oral (03/19 1201) BP: 140/74 (03/19 1201) Pulse Rate: 113 (03/19 1201)  Labs: Recent Labs    03/07/18 0515 03/08/18 0332 03/09/18 0456 03/09/18 1807  HGB 10.0* 10.5*  --   --   HCT 31.7* 33.1*  --   --   PLT 144* 149*  --   --   HEPARINUNFRC 0.39 0.63  --  0.54  CREATININE 0.76 0.83 0.93  --    Estimated Creatinine Clearance: 56.8 mL/min (by C-G formula based on SCr of 0.93 mg/dL).  Assessment: [name] presents with acute vs. chronic PE with R heart strain, also with LLE DVT. She has a h/o DVTs on Xarelto last year, but this was discontinued due to GIB.  Plan is to resume rivaroxaban eventually once nausea improves.   Heparin level is therapeutic at 0.54 this evening, on 1400 units/hr. No signs/symptoms of bleeding. No infusion issues per nursing.   Goal of Therapy:  Heparin level 0.3-0.7 units/ml Monitor platelets by anticoagulation protocol: Yes   Plan:  Resume heparin at 1400 units / hr Daily heparin level, CBC Monitor s/sx of bleeding  Thank you Doylene Canard, PharmD Clinical Pharmacist  Pager: [phone] Phone: [phone] 03/09/2018,7:29 PM

## 2018-03-09 NOTE — Progress Notes (Signed)
PROGRESS NOTE    [name]  BDZ:[id number] DOB: 11-Apr-1941 DOA: 02/28/2018 PCP: [name], MD     Brief Narrative:  [name] is a 77 year old female with past medical history of CAD, PVD and COPD, hx small bowel AVM and had been off anticoagulation since Feb 2018, had worsening left hip issues that have gotten to the point where she has not ambulated much over the past month and now presented to the emergency room on 3/10 with complaints of nausea, weakness and dyspnea on exertion and found to be in acute respiratory failure with hypoxia. Workup discovered multiple subacute pulmonary embolus on CT scan as well as a large acute DVT in her left femoral vein. 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 Patient started on heparin gtt and admitted to the hospitalist service. She was evaluated by vascular surgery in regards to her questionable mesenteric artery stenosis.  She underwent mesenteric angiogram on 3/15, however was a unable to stent SMA. Re-attempted on 3/18 and had stent placed. Since stent placement, she has had worsening abdominal pain, nausea, vomiting. Repeat CTA abd/pelvis revealed patent SMA stent with moderate abdominopelvic ascites, colonic wall thickening. GI consulted.   Assessment & Plan:   Principal Problem:   Pulmonary embolism (HCC) Active Problems:   Hypothyroidism   Essential hypertension   COPD (chronic obstructive pulmonary disease) (HCC)   CAD (coronary artery disease)   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HCC)   Acute on chronic respiratory failure with hypoxia (HCC)   PE and left femoral DVT -Patient with hx of DVT post-operation 15 years ago. She was on coumadin at that time, then it was stopped due to GI bleed/AVM in Feb 2018 -Patient has not been ambulatory due to chronic hip pain -Echo without right heart strain  -Continue IV heparin. Plan to start xarelto after re-attempt at SMA stent vs ?IVC  -She has not had any  report of GI bleeding while on IV heparin   Superior mesenteric artery stenosis -Vascular surgery consulted, angiogram 3/15 but unable to place stent, re-attempted angiogram 3/18 with stent placed in SMA. Repeat CTA abd/pelvis with patent stent   Moderate abdominopelvic ascites, colonic wall thickening  -Worsening symptoms since SMA stent placement. CTA abd/pelvis completed. GI consulted today  Essential hypertension -Stable  COPD -Stable without exacerbation  Hx small bowel AVM  -Dx 2018. No acute melena or hematochezia this hospitalization, doing well on heparin gtt   Left hip pain secondary to degenerative joint disease -Pain control, follow-up with orthopedic surgery as outpatient   DVT prophylaxis: Heparin gtt Code Status: Full Family Communication: No family at bedside  Disposition Plan: Pending GI consultation, improvement in symptoms    Consultants:   Vascular surgery  GI   Procedures:   Angiogram 3/15   Angiogram with SMA stent 3/18   Antimicrobials:  Anti-infectives (From admission, onward)   None       Subjective: Worsening right-sided abdominal pain which extends to the left side, intractable nausea and vomiting.  This is significantly worsened since the procedure yesterday.  Had a normal bowel movement today without blood.   Objective: Vitals:   03/08/18 2114 03/08/18 2233 03/09/18 0502 03/09/18 0911  BP: (!) 144/57 (!) 152/75 127/77   Pulse: (!) 115 (!) 114 (!) 112   Resp: 18 18 18    Temp:   98.2 F (36.8 C)   TempSrc:   Oral   SpO2:   96% 98%  Weight:   81.7 kg (  180 lb 3.2 oz)   Height:        Intake/Output Summary (Last 24 hours) at 03/09/2018 1112 Last data filed at 03/09/2018 0400 Gross per 24 hour  Intake 240 ml  Output -  Net 240 ml   Filed Weights   03/07/18 0622 03/08/18 0438 03/09/18 0502  Weight: 81.5 kg (179 lb 9.6 oz) 82.7 kg (182 lb 6.4 oz) 81.7 kg (180 lb 3.2 oz)    Examination: General exam: Appears calm and  comfortable  Respiratory system: Clear to auscultation. Respiratory effort normal. Cardiovascular system: S1 & S2 heard, tachycardic, regular rhythm. No JVD, murmurs, rubs, gallops or clicks. No pedal edema. Gastrointestinal system: Abdomen is nondistended, soft and TTP RUQ, epigastric, LUQ. No organomegaly or masses felt. Normal bowel sounds heard. Central nervous system: Alert and oriented. No focal neurological deficits. Extremities: Symmetric 5 x 5 power. Skin: No rashes, lesions or ulcers Psychiatry: Judgement and insight appear normal. Mood & affect appropriate.   Data Reviewed: I have personally reviewed following labs and imaging studies  CBC: Recent Labs  Lab 03/04/18 0725 03/05/18 0724 03/06/18 0439 03/07/18 0515 03/08/18 0332  WBC 8.4 8.1 7.9 7.1 7.0  HGB 12.2 11.8* 11.6* 10.0* 10.5*  HCT 39.0 37.4 36.3 31.7* 33.1*  MCV 87.6 88.6 87.5 87.1 88.0  PLT 180 165 154 144* 329*   Basic Metabolic Panel: Recent Labs  Lab 03/05/18 0724 03/06/18 0439 03/07/18 0515 03/08/18 0332 03/09/18 0456  NA 143 141 140 138 139  K 3.5 3.4* 3.4* 3.9 4.1  CL 108 108 109 108 110  CO2 28 23 24 24  20*  GLUCOSE 102* 92 98 103* 163*  BUN 9 7 11 13 16   CREATININE 0.81 0.61 0.76 0.83 0.93  CALCIUM 9.5 8.8* 8.9 9.1 8.8*  MG  --   --  1.4* 2.0  --    GFR: Estimated Creatinine Clearance: 56.8 mL/min (by C-G formula based on SCr of 0.93 mg/dL). Liver Function Tests: No results for input(s): AST, ALT, ALKPHOS, BILITOT, PROT, ALBUMIN in the last 168 hours. No results for input(s): LIPASE, AMYLASE in the last 168 hours. No results for input(s): AMMONIA in the last 168 hours. Coagulation Profile: No results for input(s): INR, PROTIME in the last 168 hours. Cardiac Enzymes: No results for input(s): CKTOTAL, CKMB, CKMBINDEX, TROPONINI in the last 168 hours. BNP (last 3 results) Recent Labs    08/17/17 1038 09/14/17 0921  PROBNP 612 503   HbA1C: No results for input(s): HGBA1C in the last  72 hours. CBG: No results for input(s): GLUCAP in the last 168 hours. Lipid Profile: No results for input(s): CHOL, HDL, LDLCALC, TRIG, CHOLHDL, LDLDIRECT in the last 72 hours. Thyroid Function Tests: No results for input(s): TSH, T4TOTAL, FREET4, T3FREE, THYROIDAB in the last 72 hours. Anemia Panel: No results for input(s): VITAMINB12, FOLATE, FERRITIN, TIBC, IRON, RETICCTPCT in the last 72 hours. Sepsis Labs: No results for input(s): PROCALCITON, LATICACIDVEN in the last 168 hours.  Recent Results (from the past 240 hour(s))  Surgical pcr screen     Status: None   Collection Time: 03/04/18  9:04 PM  Result Value Ref Range Status   MRSA, PCR NEGATIVE NEGATIVE Final   Staphylococcus aureus NEGATIVE NEGATIVE Final    Comment: (NOTE) The Xpert SA Assay (FDA approved for NASAL specimens in patients 50 years of age and older), is one component of a comprehensive surveillance program. It is not intended to diagnose infection nor to guide or monitor treatment. Performed at [facility]  [facility], [place] [address] [place], [place] [address]        Radiology Studies: Dg Abd Portable 1v  Result Date: 03/08/2018 CLINICAL DATA:  Nausea and vomiting EXAM: PORTABLE ABDOMEN - 1 VIEW COMPARISON:  CT 02/28/2018 FINDINGS: Surgical hardware in the upper lumbar spine. Surgical clips in the right upper quadrant. Nonobstructed bowel-gas pattern. Radiopaque material in the bladder. Probable diverticulum on the right side of the bladder. Status post right hip replacement with fixating screw paralleling the cortex of the right inter pelvis. IMPRESSION: 1. Nonobstructed gas pattern 2. Contrast material within the bladder Electronically Signed   By: Donavan Foil M.D.   On: 03/08/2018 22:30   Ct Angio Abd/pel W/ And/or W/o  Result Date: 03/09/2018 CLINICAL DATA:  Upper abdominal pain and nausea after mesenteric stent placement today. EXAM: CTA ABDOMEN AND PELVIS wITHOUT AND WITH CONTRAST TECHNIQUE:  Multidetector CT imaging of the abdomen and pelvis was performed using the standard protocol during bolus administration of intravenous contrast. Multiplanar reconstructed images and MIPs were obtained and reviewed to evaluate the vascular anatomy. CONTRAST:  100 cc Isovue 370 IV COMPARISON:  Abdomen/pelvis CT 02/28/2018 FINDINGS: VASCULAR Aorta: Moderate atherosclerosis. Normal caliber aorta without aneurysm, dissection, vasculitis or significant stenosis. Celiac: Plaque at the origin causes approximately 50% stenosis. No dissection or vasculitis. Distal branches are patent. SMA: Patent proximal SMA stent with resolved origin stenosis. Calcified distally but patent. No evidence of dissection. No evidence of contrast extravasation. Renals: Calcified plaque at the origins. No dissection or vasculitis. IMA: Patent without evidence of aneurysm, dissection, vasculitis or significant stenosis. Inflow: Patent without evidence of aneurysm, dissection, vasculitis or significant stenosis. Proximal Outflow: Bilateral common femoral and visualized portions of the superficial and profunda femoral arteries are patent without evidence of aneurysm, dissection, vasculitis or significant stenosis. No evidence of pseudoaneurysm formation or active extravasation at the puncture site. Veins: Not well assessed on arterial phase imaging. There is presumed mixing of opacified and non-opacified blood in the mesenteric venous confluence. Review of the MIP images confirms the above findings. NON-VASCULAR Lower chest: Linear atelectasis in both lower lobes. Trace right pleural effusion. Coronary artery calcifications. Peripherally calcified left breast implant partially included. Hepatobiliary: Slight capsular nodularity raises concern for cirrhosis. No discrete focal lesion. Postcholecystectomy with grossly unchanged biliary prominence allowing for arterial phase technique. Pancreas: Parenchymal atrophy. No ductal dilatation or inflammation.  Spleen: Normal in size without focal abnormality. Adrenals/Urinary Tract: Mild left adrenal thickening. Normal right adrenal gland. Excreted IV contrast in both kidneys and urinary bladder from angiogram earlier this day. No hydronephrosis or perinephric edema. Bilateral renal cysts. Stomach/Bowel: Small hiatal hernia. Stomach is nondistended. Presence of intra-abdominal ascites limits bowel evaluation. There is colonic wall thickening involving the ascending colon, hepatic flexure, unlikely proximal transverse colon. No definite small bowel inflammation. Colonic diverticulosis throughout the colon. Lymphatic: No definite adenopathy. No evidence of retroperitoneal hemorrhage. Reproductive: Uterus obscured by right hip arthroplasty. Other: Development of moderate volume abdominopelvic ascites. Fluid may be minimally complex, however does not represent values concerning for hemorrhage. There is mild mesenteric edema. No free air. Small supraumbilical hernia contains fat and small amount of free fluid. Musculoskeletal: Postsurgical change in the lumbar spine, unchanged from prior. Right hip arthroplasty. IMPRESSION: VASCULAR 1. Placement of superior mesenteric artery stent which is widely patent. No evidence of dissection or postprocedural pseudoaneurysm/hematoma. 2. Diffuse aortic and branch atherosclerosis. NON-VASCULAR 1. Interval development of moderate abdominopelvic ascites from prior exam. Fluid is minimally complex but does not represent hemorrhage.  2. Colonic wall thickening involving the cecum, ascending, and proximal transverse colon. This is new since prior CT. Exact etiology is uncertain. Infectious or inflammatory etiologies are considered, colonic wall edema related to reperfusion could have a similar appearance. No increased density to suggest bowel wall hemorrhage. 3. Colonic diverticulosis without specific findings to suggest diverticulitis. 4. Question of nodular hepatic contours raises concern for  cirrhosis. Recommend correlation with cirrhosis risk factors. Electronically Signed   By: Jeb Levering M.D.   On: 03/09/2018 03:18      Scheduled Meds: . allopurinol  100 mg Oral Daily  . cholecalciferol  1,000 Units Oral Daily  . citalopram  20 mg Oral TID  . clopidogrel  75 mg Oral Q breakfast  . feeding supplement  1 Container Oral TID BM  . ferrous sulfate  325 mg Oral Q breakfast  . iopamidol      . lidocaine  1 patch Transdermal Q24H  . pantoprazole  80 mg Oral Daily  . pilocarpine  1 drop Both Eyes BID  . pramipexole  0.25 mg Oral QHS  . roflumilast  500 mcg Oral Daily  . scopolamine  1 patch Transdermal Q72H  . sodium chloride flush  3 mL Intravenous Q12H  . sodium chloride flush  3 mL Intravenous Q12H  . traZODone  150 mg Oral QHS  . umeclidinium bromide  1 puff Inhalation Daily   Continuous Infusions: . sodium chloride    . sodium chloride 100 mL/hr at 03/08/18 0834  . sodium chloride    . heparin 1,400 Units/hr (03/09/18 0955)     LOS: 8 days    Time spent: 25 minutes   Dessa Phi, DO [facility] [email or web address] Password [email or web address] 03/09/2018, 11:12 AM

## 2018-03-09 NOTE — Progress Notes (Addendum)
Progress Note    03/09/2018 7:48 AM 1 Day Post-Op  Subjective:  Says she continues to be nauseated and vomiting  afebrile  Vitals:   03/08/18 2233 03/09/18 0502  BP: (!) 152/75 127/77  Pulse: (!) 114 (!) 112  Resp: 18 18  Temp:  98.2 F (36.8 C)  SpO2:  96%    Physical Exam: Lungs:  Non labored Incisions:  Right groin with some bloody drainage but no hematoma Abdomen:  Soft; diffuse tenderness throughout  CBC    Component Value Date/Time   WBC 7.0 03/08/2018 0332   RBC 3.76 (L) 03/08/2018 0332   HGB 10.5 (L) 03/08/2018 0332   HGB 12.9 08/17/2017 1038   HCT 33.1 (L) 03/08/2018 0332   HCT 38.2 08/17/2017 1038   PLT 149 (L) 03/08/2018 0332   PLT 237 08/17/2017 1038   MCV 88.0 03/08/2018 0332   MCV 88 08/17/2017 1038   MCH 27.9 03/08/2018 0332   MCHC 31.7 03/08/2018 0332   RDW 15.6 (H) 03/08/2018 0332   RDW 15.7 (H) 08/17/2017 1038   LYMPHSABS 1.9 04/26/2014 1237   MONOABS 0.8 04/26/2014 1237   EOSABS 0.2 04/26/2014 1237   BASOSABS 0.1 04/26/2014 1237    BMET    Component Value Date/Time   NA 139 03/09/2018 0456   NA 144 09/14/2017 0921   K 4.1 03/09/2018 0456   CL 110 03/09/2018 0456   CO2 20 (L) 03/09/2018 0456   GLUCOSE 163 (H) 03/09/2018 0456   BUN 16 03/09/2018 0456   BUN 12 09/14/2017 0921   CREATININE 0.93 03/09/2018 0456   CALCIUM 8.8 (L) 03/09/2018 0456   GFRNONAA 58 (L) 03/09/2018 0456   GFRAA >60 03/09/2018 0456    INR    Component Value Date/Time   INR 1.16 02/13/2017 1148     Intake/Output Summary (Last 24 hours) at 03/09/2018 0748 Last data filed at 03/09/2018 0400 Gross per 24 hour  Intake 240 ml  Output -  Net 240 ml   CTA 03/09/18: IMPRESSION: VASCULAR  1. Placement of superior mesenteric artery stent which is widely patent. No evidence of dissection or postprocedural pseudoaneurysm/hematoma. 2. Diffuse aortic and branch atherosclerosis.  NON-VASCULAR  1. Interval development of moderate abdominopelvic ascites  from prior exam. Fluid is minimally complex but does not represent hemorrhage. 2. Colonic wall thickening involving the cecum, ascending, and proximal transverse colon. This is new since prior CT. Exact etiology is uncertain. Infectious or inflammatory etiologies are considered, colonic wall edema related to reperfusion could have a similar appearance. No increased density to suggest bowel wall hemorrhage. 3. Colonic diverticulosis without specific findings to suggest diverticulitis. 4. Question of nodular hepatic contours raises concern for cirrhosis. Recommend correlation with cirrhosis risk factors.   Assessment:  77 y.o. female is s/p:  Procedure Performed: 1.  US guided cannulation of right common femoral artery 2.  Stent of sma with 6 x 9mm vbx 3.  Moderate sedation with fentanyl and versed for 67 4.  Percutaneous closure of right common femoral artery with mynx  1 Day Post-Op  Plan:  -pt continues to have nausea---pt CTA reveals that the SMA stent is widely patent with no evidence of dissection or PSA/hematoma.  Abdomen is soft with diffuse tenderness to palpation. -there is abdominopelvic ascites present from prior exam as well as colonic wall thickening ? Infectious or inflammatory and nodular hepatic contour raises concern for cirrhosis -pt afebrile and WBC normal -creatinine WNL after contrast -may need GI consult   Leontine Locket,  PA-C [facility] [phone] 03/09/2018 7:48 AM  History and exam findings as above.  Still with nausea.  Abdomen is soft on exam maybe slightly distended.  Afebrile.  Last BM yesterday "normal" no blood.  Denies history of hepatitis or ETOH use in past.  CT shows a moderate amount of ascites with ascending transverse colon inflammation.This is mainly middle colic distribution.  CBC is pending.  Agree with above.  Her nausea persists similar to pre procedure.  Would have GI evaluate.  SMA stent is widely patent.   This could represent disruption of collaterals but does not really explain the ascites.  Will continue to follow but no real other vascular input or options currently.  Follow up CBC.  If worsening clinical condition would check lactate.  Ruta Hinds, MD [facility] [place] Office: [phone] Pager: [phone]

## 2018-03-09 NOTE — Consult Note (Signed)
Referring Provider:  Dr. Maylene Roes Primary Care Physician:  [name], [name], MD Primary Gastroenterologist:  Althia Forts Dr. Melina Copa   Reason for Consultation:  Abdominal pain, abnormal CT scan  HPI: [name] is a 77 y.o. female with past medical history of coronary artery disease status post PCI in the past, history of DVT who was of anticoagulation because of GI bleed last year, history of peripheral vascular disease presented to the hospital with left hip pain and nausea and weakness.patient was subsequently found to have pulmonary embolism.CT abdomen also showed mild narrowing of the SMA. patient underwent SMA stent placement yesterday. Patient started developing worsening abdominal pain associated with nausea and vomiting. GI is consulted for further evaluation.  Patient seen and examined at bedside. Family at bedside. According to patient she was having some abdominal pain prior to hospitalization but she started noticing pressure-like and cramp-like abdominal pain which is located around periumbilical area with radiation towards both left and right side.associated with nausea and multiple episodes of nonbloody vomiting. Denied any diarrhea or constipation. Denied any blood in the stool or black stool. Pain worse with changing position.   Previous GI workup --------------------------- - Colonoscopy. 02/17/2017 by Dr. Hilarie Fredrickson  showed severe  diverticulosis. No evidence of active bleeding. - EGD 02/14/2018 by Dr. Loletha Carrow showed lower esophageal ring and nonerosive gastropathy. - capsule endoscopy on 02/15/2017 multiple scattered AVMs throughout the small bowel without any active bleeding.  Past Medical History:  Diagnosis Date  . Anemia    bld. transfusion post lumbar surgery- 2012  . Anxiety   . Arthralgia    NOS  . Blood transfusion 2011   WITH BACK SURGERY  . CAD (coronary artery disease)    s/p CABG 2004; s/p DES to LM in 2010;  [place] 10/29/11: EF 50-55%, mild elevated filling pressures,  no pulmonary HTN, LM 90% ISR, LAD and CFX occluded, S-RI occluded (old), S-OM3 ok and L-LAD ok, native nondominant RCA 95% -  med rx recommended ; Lexiscan Myoview 7/13 at [facility]: demonstrated "normal LV function, anterior attenuation and localized ischemia, inferior, basilar, mid section"  . Carotid artery occlusion    moderate  . Chronic diastolic heart failure (HCC)    Echo 9/10: EF 44-03%, grade 1 diastolic dysfunction  . COPD (chronic obstructive pulmonary disease) ([place])    Emphysema dxed by Dr. Woody Seller in [place] based on PFTs per pt in 2006; placed on albuterol  . Depression    TAKES CELEXA  AND  (OFF- WELBUTRIN)  . DVT of lower extremity (deep venous thrombosis) (HCC)    recurrent. bilateral (2 episodes)  . Dyspnea   . Dysrhythmia    afib with cabg  . Exertional angina (HCC)    Treated with Isosorbide, Ranexa, amlodipine; intolerant to metoprolol  . GERD (gastroesophageal reflux disease)   . HLD (hyperlipidemia)   . Hypertension   . Hyperthyroidism   . Obesity (BMI 30-39.9) 2009   BMI 33  . Osteoarthritis   . Pneumonia   . PVD (peripheral vascular disease) ([place])   . Sleep apnea 2012   USED CPAP THEN  STARTED USING SPIRIVA , Nov. 2013- last evaluation , changed from mask to aparatus that is just for her nose.Marland Kitchen     Past Surgical History:  Procedure Laterality Date  . ANKLE SURGERY  2004  . [place]   lower; another scheduled, opt. reports 4 back- lumbar, 3 cerv. fusions  for later 2009  . BREAST ENHANCEMENT SURGERY    .  CARDIAC CATHETERIZATION  11/2009   Patent LIMA to LAD and patent SVG to OM1. Occluded SVG to ramus and diagonal. Left main: 90% ostial stenosis, LCX 60-70% proximal stenosis  . COLONOSCOPY WITH PROPOFOL N/A 02/17/2017   Procedure: COLONOSCOPY WITH PROPOFOL;  Surgeon: Jerene Bears, MD;  Location: [facility] ENDOSCOPY;  Service: Endoscopy;  Laterality: N/A;  . CORONARY ANGIOPLASTY WITH STENT PLACEMENT  11/2009   Drug eluting stent to left main artery:  4.0 X 12 mm Ion   . CORONARY ARTERY BYPASS GRAFT  2004  . ESOPHAGOGASTRODUODENOSCOPY N/A 02/14/2017   Procedure: ESOPHAGOGASTRODUODENOSCOPY (EGD);  Surgeon: Doran Stabler, MD;  Location: [facility] ENDOSCOPY;  Service: Endoscopy;  Laterality: N/A;  . EYE SURGERY  2006   BILATERAL CAT EXT.   Marland Kitchen GIVENS CAPSULE STUDY N/A 02/15/2017   Procedure: GIVENS CAPSULE STUDY;  Surgeon: Doran Stabler, MD;  Location: [place];  Service: Endoscopy;  Laterality: N/A;  . GROIN MASS OPEN BIOPSY  2004  . JOINT REPLACEMENT  2012    BILATERAL KNEES  . JOINT REPLACEMENT  2010    RIGHT HIP REPLACEMENT  . NECK SURGERY  12/08   Due for repeat neck surgery  . PERIPHERAL VASCULAR INTERVENTION Left 03/05/2018   Procedure: PERIPHERAL VASCULAR INTERVENTION;  Surgeon: Elam Dutch, MD;  Location: [place] CV LAB;  Service: Cardiovascular;  Laterality: Left;  Attempted unsuccess\ful Per Dr. Eden Lathe  . PERIPHERAL VASCULAR INTERVENTION  03/08/2018   Procedure: PERIPHERAL VASCULAR INTERVENTION;  Surgeon: Waynetta Sandy, MD;  Location: [place] [facility];  Service: Cardiovascular;;  SMA Stent   . REVERSE SHOULDER ARTHROPLASTY  02/26/2012   Procedure: REVERSE SHOULDER ARTHROPLASTY;  Surgeon: Marin Shutter, MD;  Location: [place];  Service: Orthopedics;  Laterality: Right;  RIGHT SHOULDER REVERSED ARTHROPLASTY  . SHOULDER ARTHROSCOPY WITH SUBACROMIAL DECOMPRESSION Left 02/10/2013   Procedure: SHOULDER ARTHROSCOPY WITH SUBACROMIAL DECOMPRESSION DISTAL CLAVICLE RESECTION;  Surgeon: Marin Shutter, MD;  Location: [place];  Service: Orthopedics;  Laterality: Left;  DISTAL CLAVICLE RESECTION  . TOTAL HIP ARTHROPLASTY  2007   Right  . VISCERAL ANGIOGRAPHY N/A 03/05/2018   Procedure: VISCERAL ANGIOGRAPHY;  Surgeon: Elam Dutch, MD;  Location: [place] [facility];  Service: Cardiovascular;  Laterality: N/A;  . VISCERAL ANGIOGRAPHY N/A 03/08/2018   Procedure: VISCERAL ANGIOGRAPHY;  Surgeon: Waynetta Sandy, MD;   Location: [place] [facility];  Service: Cardiovascular;  Laterality: N/A;    Prior to Admission medications   Medication Sig Start Date End Date Taking? Authorizing Provider  allopurinol (ZYLOPRIM) 100 MG tablet Take 100 mg by mouth daily. 01/30/17  Yes [provider]  aspirin EC 81 MG tablet Take 81 mg by mouth daily.   Yes [provider]  citalopram (CELEXA) 20 MG tablet Take 20 mg by mouth 3 (three) times daily.    Yes [provider]  [employer] D3 1000 units capsule Take 1,000 Units by mouth daily. 01/14/18  Yes [provider]  ferrous sulfate 325 (65 FE) MG tablet Take 325 mg by mouth daily with breakfast.   Yes [provider]  INCRUSE ELLIPTA 62.5 MCG/INH AEPB Inhale 1 puff into the lungs 2 (two) times daily.  11/30/16  Yes [provider]  Ipratropium-Albuterol (COMBIVENT) 20-100 MCG/ACT AERS respimat Inhale 1 puff into the lungs every 6 (six) hours as needed for wheezing.   Yes [provider]  isosorbide mononitrate (IMDUR) 120 MG 24 hr tablet Take 120 mg by mouth daily.  Yes [provider]  nitroGLYCERIN (NITROSTAT) 0.4 MG SL tablet Place 1 tablet (0.4 mg total) under the tongue every 5 (five) minutes as needed for chest pain. For chest pain 04/24/16  Yes Fay Records, MD  omeprazole (PRILOSEC) 40 MG capsule Take 40 mg by mouth 2 (two) times daily.    Yes [provider]  Oxycodone HCl 10 MG TABS Take 10 mg by mouth every 6 (six) hours as needed for pain. 01/25/17  Yes [provider]  pilocarpine (PILOCAR) 1 % ophthalmic solution Place 1 drop into both eyes 2 (two) times daily. 11/30/16  Yes [provider]  potassium chloride SA (K-DUR,KLOR-CON) 20 MEQ tablet Take 20 mEq by mouth daily.   Yes [provider]  pramipexole (MIRAPEX) 0.25 MG tablet Take 0.25 mg by mouth at bedtime. 01/14/18  Yes [provider]  roflumilast (DALIRESP) 500 MCG TABS tablet Take 500 mcg by mouth  daily.    Yes [provider]  traMADol (ULTRAM) 50 MG tablet Take 150 tablets by mouth at bedtime.  11/08/17  Yes [provider]  traZODone (DESYREL) 50 MG tablet Take 150 mg by mouth at bedtime.    Yes [provider]    Scheduled Meds: . allopurinol  100 mg Oral Daily  . cholecalciferol  1,000 Units Oral Daily  . citalopram  20 mg Oral TID  . clopidogrel  75 mg Oral Q breakfast  . feeding supplement  1 Container Oral TID BM  . ferrous sulfate  325 mg Oral Q breakfast  . iopamidol      . lidocaine  1 patch Transdermal Q24H  . pantoprazole  80 mg Oral Daily  . pilocarpine  1 drop Both Eyes BID  . pramipexole  0.25 mg Oral QHS  . roflumilast  500 mcg Oral Daily  . scopolamine  1 patch Transdermal Q72H  . sodium chloride flush  3 mL Intravenous Q12H  . sodium chloride flush  3 mL Intravenous Q12H  . traZODone  150 mg Oral QHS  . umeclidinium bromide  1 puff Inhalation Daily   Continuous Infusions: . sodium chloride    . sodium chloride 100 mL/hr at 03/08/18 0834  . sodium chloride    . heparin 1,400 Units/hr (03/09/18 0955)   PRN Meds:.sodium chloride, sodium chloride, acetaminophen **OR** acetaminophen, acetaminophen, hydrALAZINE, ipratropium-albuterol, labetalol, morphine injection, ondansetron **OR** ondansetron (ZOFRAN) IV, ondansetron (ZOFRAN) IV, oxyCODONE, promethazine, sodium chloride flush, sodium chloride flush  Allergies as of 02/28/2018 - Review Complete 02/28/2018  Allergen Reaction Noted  . Zolpidem tartrate Anxiety 02/23/2012  . Pitavastatin Other (See Comments) 05/06/2017  . Ropinirole Other (See Comments) 05/06/2017  . Penicillins Rash 04/12/2008    Family History  Problem Relation Age of Onset  . Ovarian cancer Mother   . Cancer Mother   . Lung cancer Father   . COPD Father   . Heart disease Father   . Cancer Unknown   . Heart disease Unknown   . Colitis Unknown   . Heart disease Sister   . Cancer Sister     Social  History   Socioeconomic History  . Marital status: Married    Spouse name: Not on file  . Number of children: 3  . Years of education: Not on file  . Highest education level: Not on file  Social Needs  . Financial resource strain: Not on file  . Food insecurity - worry: Not on file  . Food insecurity - inability: Not on file  .  Transportation needs - medical: Not on file  . Transportation needs - non-medical: Not on file  Occupational History  . Occupation: retired    Comment: Electronics engineer: RETIRED  Tobacco Use  . Smoking status: Former Smoker    Years: 45.00    Last attempt to quit: 12/22/2002    Years since quitting: 15.2  . Smokeless tobacco: Never Used  Substance and Sexual Activity  . Alcohol use: No    Alcohol/week: 0.0 oz  . Drug use: No  . Sexual activity: No  Other Topics Concern  . Not on file  Social History Narrative   Married 62 years, lives with husband.   3 grown children.    Review of Systems: Review of Systems  Constitutional: Negative for chills and fever.  HENT: Negative for hearing loss and tinnitus.   Eyes: Negative for blurred vision and double vision.  Respiratory: Positive for shortness of breath. Negative for cough and hemoptysis.   Cardiovascular: Positive for palpitations. Negative for chest pain.  Gastrointestinal: Positive for abdominal pain, heartburn, nausea and vomiting. Negative for blood in stool, constipation, diarrhea and melena.  Genitourinary: Negative for dysuria and urgency.  Musculoskeletal: Positive for joint pain and myalgias.  Skin: Negative for itching and rash.  Neurological: Positive for weakness. Negative for seizures and loss of consciousness.  Endo/Heme/Allergies: Does not bruise/bleed easily.  Psychiatric/Behavioral: Negative for hallucinations and suicidal ideas.    Physical Exam: Vital signs: Vitals:   03/09/18 0911 03/09/18 1201  BP:  140/74  Pulse:  (!) 113  Resp:    Temp:  98.6 F (37 C)   SpO2: 98% 94%   Last BM Date: 03/07/18 Physical Exam  Constitutional: She is oriented to person, place, and time. She appears well-developed and well-nourished. No distress.  HENT:  Head: Normocephalic and atraumatic.  Mouth/Throat: Oropharynx is clear and moist. No oropharyngeal exudate.  Eyes: EOM are normal. No scleral icterus.  Neck: Normal range of motion. Neck supple.  Cardiovascular: Normal rate and regular rhythm.  Murmur heard. Pulmonary/Chest: Effort normal and breath sounds normal. No respiratory distress.  Abdominal: Soft. Bowel sounds are normal. She exhibits distension. There is tenderness. There is no rebound and no guarding.  Bandlike area of discomfort around the periumbilical area. No significant ascites on physical exam. No peritoneal signs  Musculoskeletal: Normal range of motion. She exhibits no edema.  Neurological: She is alert and oriented to person, place, and time.  Skin: Skin is warm. No erythema.  Psychiatric: She has a normal mood and affect. Judgment and thought content normal.  Vitals reviewed.   GI:  Lab Results: Recent Labs    03/07/18 0515 03/08/18 0332  WBC 7.1 7.0  HGB 10.0* 10.5*  HCT 31.7* 33.1*  PLT 144* 149*   BMET Recent Labs    03/07/18 0515 03/08/18 0332 03/09/18 0456  NA 140 138 139  K 3.4* 3.9 4.1  CL 109 108 110  CO2 24 24 20*  GLUCOSE 98 103* 163*  BUN 11 13 16   CREATININE 0.76 0.83 0.93  CALCIUM 8.9 9.1 8.8*   LFT No results for input(s): PROT, ALBUMIN, AST, ALT, ALKPHOS, BILITOT, BILIDIR, IBILI in the last 72 hours. PT/INR No results for input(s): LABPROT, INR in the last 72 hours.   Studies/Results: Dg Abd Portable 1v  Result Date: 03/08/2018 CLINICAL DATA:  Nausea and vomiting EXAM: PORTABLE ABDOMEN - 1 VIEW COMPARISON:  CT 02/28/2018 FINDINGS: Surgical hardware in the upper lumbar spine. Surgical clips in  the right upper quadrant. Nonobstructed bowel-gas pattern. Radiopaque material in the bladder. Probable  diverticulum on the right side of the bladder. Status post right hip replacement with fixating screw paralleling the cortex of the right inter pelvis. IMPRESSION: 1. Nonobstructed gas pattern 2. Contrast material within the bladder Electronically Signed   By: Donavan Foil M.D.   On: 03/08/2018 22:30   Ct Angio Abd/pel W/ And/or W/o  Result Date: 03/09/2018 CLINICAL DATA:  Upper abdominal pain and nausea after mesenteric stent placement today. EXAM: CTA ABDOMEN AND PELVIS wITHOUT AND WITH CONTRAST TECHNIQUE: Multidetector CT imaging of the abdomen and pelvis was performed using the standard protocol during bolus administration of intravenous contrast. Multiplanar reconstructed images and MIPs were obtained and reviewed to evaluate the vascular anatomy. CONTRAST:  100 cc Isovue 370 IV COMPARISON:  Abdomen/pelvis CT 02/28/2018 FINDINGS: VASCULAR Aorta: Moderate atherosclerosis. Normal caliber aorta without aneurysm, dissection, vasculitis or significant stenosis. Celiac: Plaque at the origin causes approximately 50% stenosis. No dissection or vasculitis. Distal branches are patent. SMA: Patent proximal SMA stent with resolved origin stenosis. Calcified distally but patent. No evidence of dissection. No evidence of contrast extravasation. Renals: Calcified plaque at the origins. No dissection or vasculitis. IMA: Patent without evidence of aneurysm, dissection, vasculitis or significant stenosis. Inflow: Patent without evidence of aneurysm, dissection, vasculitis or significant stenosis. Proximal Outflow: Bilateral common femoral and visualized portions of the superficial and profunda femoral arteries are patent without evidence of aneurysm, dissection, vasculitis or significant stenosis. No evidence of pseudoaneurysm formation or active extravasation at the puncture site. Veins: Not well assessed on arterial phase imaging. There is presumed mixing of opacified and non-opacified blood in the mesenteric venous  confluence. Review of the MIP images confirms the above findings. NON-VASCULAR Lower chest: Linear atelectasis in both lower lobes. Trace right pleural effusion. Coronary artery calcifications. Peripherally calcified left breast implant partially included. Hepatobiliary: Slight capsular nodularity raises concern for cirrhosis. No discrete focal lesion. Postcholecystectomy with grossly unchanged biliary prominence allowing for arterial phase technique. Pancreas: Parenchymal atrophy. No ductal dilatation or inflammation. Spleen: Normal in size without focal abnormality. Adrenals/Urinary Tract: Mild left adrenal thickening. Normal right adrenal gland. Excreted IV contrast in both kidneys and urinary bladder from angiogram earlier this day. No hydronephrosis or perinephric edema. Bilateral renal cysts. Stomach/Bowel: Small hiatal hernia. Stomach is nondistended. Presence of intra-abdominal ascites limits bowel evaluation. There is colonic wall thickening involving the ascending colon, hepatic flexure, unlikely proximal transverse colon. No definite small bowel inflammation. Colonic diverticulosis throughout the colon. Lymphatic: No definite adenopathy. No evidence of retroperitoneal hemorrhage. Reproductive: Uterus obscured by right hip arthroplasty. Other: Development of moderate volume abdominopelvic ascites. Fluid may be minimally complex, however does not represent values concerning for hemorrhage. There is mild mesenteric edema. No free air. Small supraumbilical hernia contains fat and small amount of free fluid. Musculoskeletal: Postsurgical change in the lumbar spine, unchanged from prior. Right hip arthroplasty. IMPRESSION: VASCULAR 1. Placement of superior mesenteric artery stent which is widely patent. No evidence of dissection or postprocedural pseudoaneurysm/hematoma. 2. Diffuse aortic and branch atherosclerosis. NON-VASCULAR 1. Interval development of moderate abdominopelvic ascites from prior exam. Fluid  is minimally complex but does not represent hemorrhage. 2. Colonic wall thickening involving the cecum, ascending, and proximal transverse colon. This is new since prior CT. Exact etiology is uncertain. Infectious or inflammatory etiologies are considered, colonic wall edema related to reperfusion could have a similar appearance. No increased density to suggest bowel wall hemorrhage. 3. Colonic diverticulosis without specific findings  to suggest diverticulitis. 4. Question of nodular hepatic contours raises concern for cirrhosis. Recommend correlation with cirrhosis risk factors. Electronically Signed   By: Jeb Levering M.D.   On: 03/09/2018 03:18    Impression/Plan: - . New-onset ascites after placement of SMA stent. Etiology unclear. No significant ascites on physical exam. - Colitis involving cecum, ascending colon and proximal transverse colon.CT scan on 03/01/2018 showed no colitis. Most likely ischemic/reperfusion injury. - Questionable cirrhosis based on  CT showing nodular counter of the liver. Less likely cirrhosis because CT scan on March 11 showed normal-appearing liver. EGD last year showed no evidence of esophageal varices.patient had normal platelet count prior to arrival.normal LFTs earlier this month. - Status post SMA stent placement yesterday. Currently on Plavix -  pulmonary embolism. Currently on heparin drip. - history of DVT  Recommendations ------------------------ - diagnostic paracentesis. Physical examination showed no significant ascites. - Her colitis is most likely perfusion injury or ischemic injury. Patient had a normal CT scan 10 days ago without any evidence of colitis. Colonoscopy last year also showed no evidence of colitis. - recommend repeat CT scan in 4 weeks to document resolution of colitis. - there is no leukocytosis. If patient continues to have abdominal pain, consider starting antibiotics after paracentesis. - Gi will follow.    LOS: 8 days    Otis Brace  MD, FACP 03/09/2018, 12:08 PM  Contact #  [phone]

## 2018-03-09 NOTE — Progress Notes (Signed)
ANTICOAGULATION CONSULT NOTE - Follow Up Consult  Pharmacy Consult for Heparin Indication: DVT, PE  Allergies  Allergen Reactions  . Zolpidem Tartrate Anxiety    CONFUSION   . Pitavastatin Other (See Comments)    Not sure  . Ropinirole Nausea Only    Makes legs jump  . Penicillins Rash   Patient Measurements: Height: 5\' 8"  (172.7 cm) Weight: 180 lb 3.2 oz (81.7 kg)(scale a) IBW/kg (Calculated) : 63.9 Heparin Dosing Weight: 77.8 kg  Vital Signs: Temp: 98.2 F (36.8 C) (03/19 0502) Temp Source: Oral (03/19 0502) BP: 127/77 (03/19 0502) Pulse Rate: 112 (03/19 0502)  Labs: Recent Labs    03/06/18 1603 03/07/18 0515 03/08/18 0332 03/09/18 0456  HGB  --  10.0* 10.5*  --   HCT  --  31.7* 33.1*  --   PLT  --  144* 149*  --   HEPARINUNFRC 0.15* 0.39 0.63  --   CREATININE  --  0.76 0.83 0.93   Estimated Creatinine Clearance: 56.8 mL/min (by C-G formula based on SCr of 0.93 mg/dL).  Assessment: 46 yoF presents with acute vs. chronic PE with R heart strain, also with LLE DVT. She has a h/o DVTs on Xarelto last year, but this was discontinued due to GIB.  Plan is to resume rivaroxaban eventually  Resuming heparin today pending transition to Xarelto when nausea improved  Goal of Therapy:  Heparin level 0.3-0.7 units/ml Monitor platelets by anticoagulation protocol: Yes   Plan:  Resume heparin at 1400 units / hr 8 hour heparin level Daily heparin level, CBC Monitor s/sx of bleeding  Thank you Anette Guarneri, PharmD [phone] 03/09/2018,9:43 AM

## 2018-03-10 ENCOUNTER — Encounter (HOSPITAL_COMMUNITY): Payer: Self-pay | Admitting: Physician Assistant

## 2018-03-10 ENCOUNTER — Inpatient Hospital Stay (HOSPITAL_COMMUNITY): Payer: Medicare HMO

## 2018-03-10 DIAGNOSIS — E039 Hypothyroidism, unspecified: Secondary | ICD-10-CM

## 2018-03-10 DIAGNOSIS — N179 Acute kidney failure, unspecified: Secondary | ICD-10-CM

## 2018-03-10 DIAGNOSIS — R188 Other ascites: Secondary | ICD-10-CM

## 2018-03-10 DIAGNOSIS — K529 Noninfective gastroenteritis and colitis, unspecified: Secondary | ICD-10-CM | POA: Clinically undetermined

## 2018-03-10 DIAGNOSIS — R935 Abnormal findings on diagnostic imaging of other abdominal regions, including retroperitoneum: Secondary | ICD-10-CM | POA: Clinically undetermined

## 2018-03-10 HISTORY — PX: IR PARACENTESIS: IMG2679

## 2018-03-10 LAB — HEPARIN LEVEL (UNFRACTIONATED)
Heparin Unfractionated: 1 IU/mL — ABNORMAL HIGH (ref 0.30–0.70)
Heparin Unfractionated: 0.94 IU/mL — ABNORMAL HIGH (ref 0.30–0.70)
Heparin Unfractionated: 0.63 IU/mL (ref 0.30–0.70)

## 2018-03-10 LAB — PROTEIN, PLEURAL OR PERITONEAL FLUID: Total protein, fluid: <3 g/dL

## 2018-03-10 LAB — BODY FLUID CELL COUNT WITH DIFFERENTIAL
Lymphs, Fluid: 4 %
Monocyte-Macrophage-Serous Fluid: 59 % (ref 50–90)
Neutrophil Count, Fluid: 37 % — ABNORMAL HIGH (ref 0–25)
Total Nucleated Cell Count, Fluid: 57 cu mm (ref 0–1000)

## 2018-03-10 LAB — CBC
HCT: 40.4 % (ref 36.0–46.0)
Hemoglobin: 13 g/dL (ref 12.0–15.0)
MCH: 27.9 pg (ref 26.0–34.0)
MCHC: 32.2 g/dL (ref 30.0–36.0)
MCV: 86.7 fL (ref 78.0–100.0)
Platelets: 235 10*3/uL (ref 150–400)
RBC: 4.66 MIL/uL (ref 3.87–5.11)
RDW: 16.3 % — ABNORMAL HIGH (ref 11.5–15.5)
WBC: 16.2 10*3/uL — ABNORMAL HIGH (ref 4.0–10.5)

## 2018-03-10 LAB — AMYLASE, PLEURAL OR PERITONEAL FLUID: Amylase, Fluid: 7 U/L

## 2018-03-10 LAB — ALBUMIN, PLEURAL OR PERITONEAL FLUID: Albumin, Fluid: 1.3 g/dL

## 2018-03-10 LAB — GRAM STAIN

## 2018-03-10 LAB — BASIC METABOLIC PANEL
Anion gap: 10 (ref 5–15)
BUN: 24 mg/dL — ABNORMAL HIGH (ref 6–20)
CO2: 21 mmol/L — ABNORMAL LOW (ref 22–32)
Calcium: 8.8 mg/dL — ABNORMAL LOW (ref 8.9–10.3)
Chloride: 107 mmol/L (ref 101–111)
Creatinine, Ser: 1.05 mg/dL — ABNORMAL HIGH (ref 0.44–1.00)
GFR calc Af Amer: 58 mL/min — ABNORMAL LOW (ref >60)
GFR calc non Af Amer: 50 mL/min — ABNORMAL LOW (ref >60)
Glucose, Bld: 119 mg/dL — ABNORMAL HIGH (ref 65–99)
Potassium: 4 mmol/L (ref 3.5–5.1)
Sodium: 138 mmol/L (ref 135–145)

## 2018-03-10 MED ORDER — CIPROFLOXACIN IN D5W 400 MG/200ML IV SOLN
400.0000 mg | Freq: Two times a day (BID) | INTRAVENOUS | Status: DC
  Administered 2018-03-10: 400 mg via INTRAVENOUS
  Filled 2018-03-10: qty 200

## 2018-03-10 MED ORDER — METRONIDAZOLE IN NACL 5-0.79 MG/ML-% IV SOLN
500.0000 mg | Freq: Three times a day (TID) | INTRAVENOUS | Status: DC
  Administered 2018-03-10: 500 mg via INTRAVENOUS
  Filled 2018-03-10: qty 100

## 2018-03-10 MED ORDER — LIDOCAINE HCL 2 % IJ SOLN
INTRAMUSCULAR | Status: AC | PRN
  Administered 2018-03-10: 10 mL

## 2018-03-10 MED ORDER — LIDOCAINE HCL (PF) 2 % IJ SOLN
INTRAMUSCULAR | Status: AC
  Filled 2018-03-10: qty 20

## 2018-03-10 NOTE — Progress Notes (Signed)
[facility] for Heparin Indication: DVT, PE  Allergies  Allergen Reactions  . Zolpidem Tartrate Anxiety    CONFUSION   . Pitavastatin Other (See Comments)    Not sure  . Ropinirole Nausea Only    Makes legs jump  . Penicillins Rash   Patient Measurements: Height: 5\' 8"  (172.7 cm) Weight: 179 lb 9.6 oz (81.5 kg) IBW/kg (Calculated) : 63.9 Heparin Dosing Weight: 77.8 kg  Vital Signs: Temp: 98.5 F (36.9 C) (03/20 0457) Temp Source: Oral (03/20 0457) BP: 159/66 (03/20 0457) Pulse Rate: 107 (03/20 0457)  Labs: Recent Labs    03/08/18 0332 03/09/18 0456 03/09/18 1807 03/10/18 0503 03/10/18 0852  HGB 10.5*  --   --  13.0  --   HCT 33.1*  --   --  40.4  --   PLT 149*  --   --  235  --   HEPARINUNFRC 0.63  --  0.54 1.00* 0.94*  CREATININE 0.83 0.93  --  1.05*  --     Assessment: 37 yoF presents with acute vs. chronic PE with R heart strain, also with LLE DVT. She has a h/o DVTs on Xarelto last year, but this was discontinued due to GIB.    Heparin level = 0.94 HgB stable  S/p SMA stent. Pending paracentesis     Goal of Therapy:  Heparin level 0.3-0.7 units/ml Monitor platelets by anticoagulation protocol: Yes   Plan:  Decrease heparin to 1200 units / hr 8 hour heparin level Daily heparin level, CBC  Thank you Anette Guarneri, PharmD [phone]

## 2018-03-10 NOTE — Progress Notes (Signed)
[facility] for Heparin Indication: DVT, PE  Allergies  Allergen Reactions  . Zolpidem Tartrate Anxiety    CONFUSION   . Pitavastatin Other (See Comments)    Not sure  . Ropinirole Nausea Only    Makes legs jump  . Penicillins Rash   Patient Measurements: Height: 5\' 8"  (172.7 cm) Weight: 179 lb 9.6 oz (81.5 kg) IBW/kg (Calculated) : 63.9 Heparin Dosing Weight: 77.8 kg  Vital Signs: Temp: 98.4 F (36.9 C) (03/20 1129) Temp Source: Oral (03/20 1129) BP: 117/61 (03/20 1129) Pulse Rate: 90 (03/20 1129)  Labs: Recent Labs    03/08/18 0332 03/09/18 0456  03/10/18 0503 03/10/18 0852 03/10/18 1834  HGB 10.5*  --   --  13.0  --   --   HCT 33.1*  --   --  40.4  --   --   PLT 149*  --   --  235  --   --   HEPARINUNFRC 0.63  --    < > 1.00* 0.94* 0.63  CREATININE 0.83 0.93  --  1.05*  --   --    < > = values in this interval not displayed.    Assessment: 86 yoF presents with acute vs. chronic PE with R heart strain, also with LLE DVT. She has a h/o DVTs on Xarelto last year, but this was discontinued due to GIB.    Heparin level = 0.63 heparin drip rate 1200 uts/hr  HgB stable  S/p SMA stent. S/p paracentesis     Goal of Therapy:  Heparin level 0.3-0.7 units/ml Monitor platelets by anticoagulation protocol: Yes   Plan:  Continue heparin 1200 units / hr Daily heparin level, CBC  Bonnita Nasuti Pharm.D. CPP, BCPS Clinical Pharmacist [phone] 03/10/2018 7:44 PM

## 2018-03-10 NOTE — Progress Notes (Signed)
Physical Therapy Treatment Patient Details Name: [name] MRN: [id number] DOB: June 30, 1941 Today's Date: 03/10/2018    History of Present Illness 77 year old female with past medical history of R THA, B TKA, B TSA, back surgery, neck surgery, CAD, PVD and COPD who is had worsening left hip issues that have gotten to the point where she does not ambulate much over the past month and presented to the emergency room on 3/10 with complaints of nausea, weakness and dyspnea on exertion and found to be in acute respiratory failure with hypoxia. Workup discovered multiplesubacute pulmonary embolus on CT scan. 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  During hospitalization patient is now s/p abdominal angiogram and parcentesis on 3/20.      PT Comments    Pt continues to doubt what she is capable of but continues to improve toward her goals.  To date patient remains appropriate to return home with support from her family and HHPT.  Pt denies pain during session this pm.      Follow Up Recommendations  Home health PT     Equipment Recommendations  None recommended by PT    Recommendations for Other Services       Precautions / Restrictions Precautions Precautions: None Precaution Comments: pt denies h/o falls in past 1 year Restrictions Weight Bearing Restrictions: No    Mobility  Bed Mobility Overal bed mobility: Needs Assistance Bed Mobility: Supine to Sit     Supine to sit: Supervision     General bed mobility comments: Pt able to perform supine to sit unassisted.  PTA managed lines and leads.    Transfers Overall transfer level: Needs assistance Equipment used: Rolling walker (2 wheeled) Transfers: Sit to/from Stand Sit to Stand: Supervision         General transfer comment: Cues for hand placement in standing.  Pt able to manage standing with B UE support.    Ambulation/Gait Ambulation/Gait assistance: Min guard Ambulation  Distance (Feet): 120 Feet Assistive device: Rolling walker (2 wheeled) Gait Pattern/deviations: Step-through pattern;Trunk flexed;Decreased stride length   Gait velocity interpretation: Below normal speed for age/gender General Gait Details: Pt with improve cadence and HR stable at 94bpm.  Pt remains to require cues for forward gaze.  SPO2 90% post gait training.      Stairs            Wheelchair Mobility    Modified Rankin (Stroke Patients Only)       Balance Overall balance assessment: Needs assistance   Sitting balance-Leahy Scale: Good       Standing balance-Leahy Scale: Fair                              Cognition Arousal/Alertness: Awake/alert Behavior During Therapy: WFL for tasks assessed/performed Overall Cognitive Status: Within Functional Limits for tasks assessed                                        Exercises      General Comments        Pertinent Vitals/Pain Pain Assessment: No/denies pain(Pt reports " I don't have pain during session."  This does not appear true so limited to tolerance.  )    Home Living  Prior Function            PT Goals (current goals can now be found in the care plan section) Acute Rehab PT Goals Patient Stated Goal: return to independence with mobility Potential to Achieve Goals: Good Progress towards PT goals: Progressing toward goals    Frequency    Min 3X/week      PT Plan Current plan remains appropriate    Co-evaluation              AM-PAC PT "6 Clicks" Daily Activity  Outcome Measure  Difficulty turning over in bed (including adjusting bedclothes, sheets and blankets)?: None Difficulty moving from lying on back to sitting on the side of the bed? : A Little Difficulty sitting down on and standing up from a chair with arms (e.g., wheelchair, bedside commode, etc,.)?: A Lot Help needed moving to and from a bed to chair (including a  wheelchair)?: A Little Help needed walking in hospital room?: A Little Help needed climbing 3-5 steps with a railing? : A Little 6 Click Score: 18    End of Session Equipment Utilized During Treatment: Gait belt Activity Tolerance: Patient tolerated treatment well Patient left: with call bell/phone within reach;in chair Nurse Communication: Mobility status PT Visit Diagnosis: Difficulty in walking, not elsewhere classified (R26.2)     Time: 1648-1700 PT Time Calculation (min) (ACUTE ONLY): 12 min  Charges:  $Gait Training: 8-22 mins                    G Codes:       [name], PTA pager [place] 03/10/2018, 5:08 PM

## 2018-03-10 NOTE — Procedures (Signed)
PROCEDURE SUMMARY:  Successful US guided paracentesis from right lateral abdomen.  Yielded 100 mL of clear red fluid.  No immediate complications.  Pt tolerated well.   Specimen was sent for labs.  Thora Scherman S Finnleigh Marchetti PA-C 03/10/2018 11:13 AM

## 2018-03-10 NOTE — Progress Notes (Signed)
  Progress Note    03/10/2018 8:13 AM 2 Days Post-Op  Subjective:  C/o abdominal pain and nausea  afebrile  Vitals:   03/10/18 0457 03/10/18 0810  BP: (!) 159/66   Pulse: (!) 107   Resp: 18   Temp: 98.5 F (36.9 C)   SpO2: 94% 95%    Physical Exam: General:  Sleeping; awakes easily; no distress Lungs:  Non labored Abdomen:  Diffuse tenderness; remains soft; decreased BS  CBC    Component Value Date/Time   WBC 16.2 (H) 03/10/2018 0503   RBC 4.66 03/10/2018 0503   HGB 13.0 03/10/2018 0503   HGB 12.9 08/17/2017 1038   HCT 40.4 03/10/2018 0503   HCT 38.2 08/17/2017 1038   PLT 235 03/10/2018 0503   PLT 237 08/17/2017 1038   MCV 86.7 03/10/2018 0503   MCV 88 08/17/2017 1038   MCH 27.9 03/10/2018 0503   MCHC 32.2 03/10/2018 0503   RDW 16.3 (H) 03/10/2018 0503   RDW 15.7 (H) 08/17/2017 1038   LYMPHSABS 1.9 04/26/2014 1237   MONOABS 0.8 04/26/2014 1237   EOSABS 0.2 04/26/2014 1237   BASOSABS 0.1 04/26/2014 1237    BMET    Component Value Date/Time   NA 138 03/10/2018 0503   NA 144 09/14/2017 0921   K 4.0 03/10/2018 0503   CL 107 03/10/2018 0503   CO2 21 (L) 03/10/2018 0503   GLUCOSE 119 (H) 03/10/2018 0503   BUN 24 (H) 03/10/2018 0503   BUN 12 09/14/2017 0921   CREATININE 1.05 (H) 03/10/2018 0503   CALCIUM 8.8 (L) 03/10/2018 0503   GFRNONAA 50 (L) 03/10/2018 0503   GFRAA 58 (L) 03/10/2018 0503    INR    Component Value Date/Time   INR 1.16 02/13/2017 1148     Intake/Output Summary (Last 24 hours) at 03/10/2018 0813 Last data filed at 03/10/2018 0500 Gross per 24 hour  Intake 793.17 ml  Output 800 ml  Net -6.83 ml     Assessment:  77 y.o. female is s/p:  1.US guided cannulation of right common femoral artery 2.Stent of sma with 6 x 22mm vbx 3.Moderate sedation with fentanyl and versed for 67 4.Percutaneous closure of right common femoral artery with mynx  2 Days Post-Op   Plan: -pt still with abdominal pain and nausea-no  vomiting -had soft BM yesterday denies any blood in stool -GI consult yesterday - for diagnostic paracentesis -now with leukocytosis; GI recommends ABx after paracentesis if increase in white count; pt remains afebrile -on heparin gtt for PE -plavix for SMA stent   Leontine Locket, PA-C [facility] [phone] 03/10/2018 8:13 AM

## 2018-03-10 NOTE — Progress Notes (Signed)
77 year old female is 48 hours status post SMA stenting, followed shortly thereafter by significant abdominal pain and persistent nausea.    The abdominal pain is epigastric in location, and comes in waves, superimposed on background persistent discomfort.  She rates the pain at approximately 7 on a scale of 10.  She does not feel that the pain has either worsened or improved over the past 48 hours.  It is somewhat relieved by IV morphine.  Her nausea is being partially relieved by Zofran and she is able to keep down small amounts of liquids, but is still having some vomiting.  A CT scan showed colitis in the distribution of the SMA (A ascending and proximal transverse colon was present as well as new onset ascites, which was tapped today and which shows a small number of white cells, although it was red and turbid in color.  On exam, the patient is lying in bed and is able to partially sit up in bed without undue discomfort.  She is in no evident distress.  Her pulse is strong, her skin is well perfused.  The abdomen has very active but nonobstructive bowel sounds.  There is some tympany in the epigastric area where her discomfort is maximal.  However, there does not appear to be any overt tenderness in that location.  Her labs today are pertinent for elevation of the white count to 16,000 which is new, and rise in hemoglobin and creatinine suggestive of hemoconcentration, possibly related to third spacing.  Impression: I suspect she had ischemic injury to the proximal colon during the stenting process, and that the ascites is transudative in reaction to that injury.  Recommendation:  1.  Symptomatic care, as is currently being done, for symptoms of pain and nausea 2.  Monitor white count and renal function; check lipase (ordered) 3.  Depending on tomorrow's labs, consider an increase in her IV fluids (however, note that the patient states she is making large amounts of urine, which would go against  volume contraction at present) 4.  Given the absence of fever (her temperature has been running around 100 degrees), I would be okay observing the patient without antibiotic therapy despite her elevated white count, which is presumably a consequence of inflammation rather than infection. 5.  Dr. Wonda Horner is covering our hospital service tomorrow.  Cleotis Nipper, M.D. Pager [phone] If no answer or after 5 PM call [phone]

## 2018-03-10 NOTE — Plan of Care (Signed)
  Education: Knowledge of General Education information will improve 03/10/2018 0107 - Progressing by Theador Hawthorne, RN   Nutrition: Adequate nutrition will be maintained 03/10/2018 0107 - Progressing by Theador Hawthorne, RN   Elimination: Will not experience complications related to bowel motility 03/10/2018 0107 - Progressing by Theador Hawthorne, RN   Elimination: Will not experience complications related to urinary retention 03/10/2018 0107 - Progressing by Theador Hawthorne, RN

## 2018-03-10 NOTE — Progress Notes (Signed)
[place] for Heparin Indication: DVT, PE  Allergies  Allergen Reactions  . Zolpidem Tartrate Anxiety    CONFUSION   . Pitavastatin Other (See Comments)    Not sure  . Ropinirole Nausea Only    Makes legs jump  . Penicillins Rash   Patient Measurements: Height: 5\' 8"  (172.7 cm) Weight: 179 lb 9.6 oz (81.5 kg) IBW/kg (Calculated) : 63.9 Heparin Dosing Weight: 77.8 kg  Vital Signs: Temp: 98.5 F (36.9 C) (03/20 0457) Temp Source: Oral (03/20 0457) BP: 159/66 (03/20 0457) Pulse Rate: 107 (03/20 0457)  Labs: Recent Labs    03/08/18 0332 03/09/18 0456 03/09/18 1807 03/10/18 0503  HGB 10.5*  --   --  13.0  HCT 33.1*  --   --  40.4  PLT 149*  --   --  235  HEPARINUNFRC 0.63  --  0.54 1.00*  CREATININE 0.83 0.93  --  1.05*    Assessment: 44 yoF presents with acute vs. chronic PE with R heart strain, also with LLE DVT. She has a h/o DVTs on Xarelto last year, but this was discontinued due to GIB.    Heparin level returned supratherapeutic this morning. The level does not make sense as patient was previously therapeutic on this rate. I spoke with the RN, the patient is not having any bleeding issues. Will recheck level, RN unsure from where it was drawn.     Goal of Therapy:  Heparin level 0.3-0.7 units/ml Monitor platelets by anticoagulation protocol: Yes   Plan:  Continue heparin at 1400 units / hr Daily heparin level, CBC Recheck HL   Harvel Quale 03/10/2018 6:29 AM

## 2018-03-10 NOTE — Progress Notes (Signed)
PROGRESS NOTE    [name]  CBU:[id number] DOB: 1941-01-28 DOA: 02/28/2018 PCP: [name], MD    Brief Narrative:  [name] is a 77 year old female with past medical history of CAD, PVD and COPD, hx small bowel AVM and had been off anticoagulation since Feb 2018, had worsening left hip issues that have gotten to the point where she has not ambulated much over the past month and now presented to the emergency room on 3/10 with complaints of nausea, weakness and dyspnea on exertion and found to be in acute respiratory failure with hypoxia. Workup discovered multiple subacute pulmonary embolus on CT scanas well as a large acute DVT in her left femoral vein. 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 Patient started on heparin gtt and admitted to the hospitalist service. She was evaluated by vascular surgery in regards to her questionable mesenteric artery stenosis.  She underwent mesenteric angiogram on 3/15, however was a unable to stent SMA. Re-attempted on 3/18 and had stent placed. Since stent placement, she has had worsening abdominal pain, nausea, vomiting. Repeat CTA abd/pelvis revealed patent SMA stent with moderate abdominopelvic ascites, colonic wall thickening. GI consulted.      Assessment & Plan:   Principal Problem:   Pulmonary embolism (HCC) Active Problems:   Colitis   Abnormal CT of the abdomen   Hypothyroidism   Essential hypertension   COPD (chronic obstructive pulmonary disease) (HCC)   CAD (coronary artery disease)   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HCC)   Acute on chronic respiratory failure with hypoxia (HCC)  #1 PE and acute left femoral DVT Patient with prior history of DVT postop 15 years ago was on anticoagulation with Coumadin at that time however this was discontinued in February 2018 secondary to GI bleed/AVM.  Patient with poor ambulatory status due to chronic hip pain.  2D echo done negative for right heart  strain.  2D echo with normal EF.  Patient currently on IV heparin drip.  Will defer to vascular surgery when okay to transition to Xarelto.  Patient with no overt bleeding on IV heparin.  2.  Superior mesenteric artery stenosis Status post angiogram March 05, 2018 however unable to have stents placed at that time.  Angiogram was reattempted 03/08/2018 with successful stent placement in the SMA.  Repeat CT angiogram abdomen and pelvis with patent stents.  Per vascular surgery.  3.  Moderate abdominal pelvic ascites/colonic wall thickening Patient noted to have upper abdominal pain and noted to have worsening symptoms since stent placement of SMA.  CT angiogram abdomen and pelvis was completed with some abnormal results noted of colonic wall thickening involving the cecum, ascending and proximal transverse colon.  Also noted was nodular hepatic contours.  Colonic diverticulosis.  GI was consulted and is currently following.  Patient noted to have a leukocytosis up to 16,000 today from normal yesterday.  Patient initially started on IV ciprofloxacin and IV Flagyl empirically as patient with some nausea and emesis this morning. Per GI, likely ischemic injury to proximal colon during stenting and transudative ascites in reaction to that injury recommending symptomatic care at this time given absence of fever.  Will discontinue I empiric IV antibiotics and monitor closely.  Per GI.  4.  Hypertension Stable.  5.  COPD Currently stable.  6.  History of small bowel AVM Diagnosed in 2018.  Patient with no acute symptoms of bleeding and doing okay on heparin drip.  Monitor closely.  7.  Chronic  left hip pain secondary to degenerative joint disease Symptomatic treatment.  Pain control.  Outpatient follow-up with orthopedic surgery.     DVT prophylaxis: Heparin drip. Code Status: Full Family Communication: Updated patient and family friend at bedside. Disposition Plan: Likely home with home health once  patient has improved clinically with no further abdominal pain and tolerating a solid diet and per GI and vascular surgery.   Consultants:   Gastroenterology: Dr.Brahmbhatt 03/09/2018  Vascular surgery: Dr. [name] 03/01/2018  Procedures:   CT abdomen and pelvis 03/01/2018  CT angiogram chest 03/01/2018  CT angiogram abdomen and pelvis with and without contrast 03/09/2018  Abdominal films 03/08/2018  2D echo 03/02/2018  Lower extremity Dopplers 03/01/2018  Ultrasound-guided right lateral abdominal paracentesis with 100 cc of clear red fluid removed per Dr. Vernard Gambles 03/10/2018  Abdominal aortogram selective superior mesenteric angiogram attempted superior mesenteric artery stent placement per Dr. [name] 03/05/2018  Ultrasound-guided cannulation of right common femoral artery, stent of SMA with 6 x 39 mm VBX, percutaneous closure of right common femoral artery with mynx per Dr. Donzetta Matters 03/08/2018  Antimicrobials:   IV ciprofloxacin 03/10/2018>>>> 03/10/2018  IV Flagyl 03/10/2018>>>>>> 03/10/2018   Subjective: Patient sitting up in bed stated had an episode of nausea and emesis this morning.  Still complaining of upper abdominal pain with no significant improvement since yesterday.  Denies any shortness of breath.  Denies any chest pain.  Objective: Vitals:   03/09/18 2045 03/10/18 0457 03/10/18 0810 03/10/18 1129  BP: (!) 144/79 (!) 159/66  117/61  Pulse: (!) 103 (!) 107  90  Resp: 18 18  18   Temp: 98.2 F (36.8 C) 98.5 F (36.9 C)  98.4 F (36.9 C)  TempSrc: Oral Oral  Oral  SpO2: 92% 94% 95% 94%  Weight:  81.5 kg (179 lb 9.6 oz)    Height:        Intake/Output Summary (Last 24 hours) at 03/10/2018 1206 Last data filed at 03/10/2018 1010 Gross per 24 hour  Intake 1033.17 ml  Output 800 ml  Net 233.17 ml   Filed Weights   03/08/18 0438 03/09/18 0502 03/10/18 0457  Weight: 82.7 kg (182 lb 6.4 oz) 81.7 kg (180 lb 3.2 oz) 81.5 kg (179 lb 9.6 oz)    Examination:  General  exam: Appears calm and comfortable  Respiratory system: Clear to auscultation. Respiratory effort normal. Cardiovascular system: S1 & S2 heard, RRR. No JVD, murmurs, rubs, gallops or clicks. No pedal edema. Gastrointestinal system: Abdomen is nondistended, soft and tenderness to palpation bilateral upper abdomen.  No organomegaly or masses felt. Normal bowel sounds heard. Central nervous system: Alert and oriented. No focal neurological deficits. Extremities: Symmetric 5 x 5 power. Skin: No rashes, lesions or ulcers Psychiatry: Judgement and insight appear normal. Mood & affect appropriate.     Data Reviewed: I have personally reviewed following labs and imaging studies  CBC: Recent Labs  Lab 03/05/18 0724 03/06/18 0439 03/07/18 0515 03/08/18 0332 03/10/18 0503  WBC 8.1 7.9 7.1 7.0 16.2*  HGB 11.8* 11.6* 10.0* 10.5* 13.0  HCT 37.4 36.3 31.7* 33.1* 40.4  MCV 88.6 87.5 87.1 88.0 86.7  PLT 165 154 144* 149* 119   Basic Metabolic Panel: Recent Labs  Lab 03/06/18 0439 03/07/18 0515 03/08/18 0332 03/09/18 0456 03/10/18 0503  NA 141 140 138 139 138  K 3.4* 3.4* 3.9 4.1 4.0  CL 108 109 108 110 107  CO2 23 24 24  20* 21*  GLUCOSE 92 98 103* 163* 119*  BUN 7  11 13 16  24*  CREATININE 0.61 0.76 0.83 0.93 1.05*  CALCIUM 8.8* 8.9 9.1 8.8* 8.8*  MG  --  1.4* 2.0  --   --    GFR: Estimated Creatinine Clearance: 50.2 mL/min (A) (by C-G formula based on SCr of 1.05 mg/dL (H)). Liver Function Tests: No results for input(s): AST, ALT, ALKPHOS, BILITOT, PROT, ALBUMIN in the last 168 hours. No results for input(s): LIPASE, AMYLASE in the last 168 hours. No results for input(s): AMMONIA in the last 168 hours. Coagulation Profile: No results for input(s): INR, PROTIME in the last 168 hours. Cardiac Enzymes: No results for input(s): CKTOTAL, CKMB, CKMBINDEX, TROPONINI in the last 168 hours. BNP (last 3 results) Recent Labs    08/17/17 1038 09/14/17 0921  PROBNP 612 503    HbA1C: No results for input(s): HGBA1C in the last 72 hours. CBG: No results for input(s): GLUCAP in the last 168 hours. Lipid Profile: No results for input(s): CHOL, HDL, LDLCALC, TRIG, CHOLHDL, LDLDIRECT in the last 72 hours. Thyroid Function Tests: No results for input(s): TSH, T4TOTAL, FREET4, T3FREE, THYROIDAB in the last 72 hours. Anemia Panel: No results for input(s): VITAMINB12, FOLATE, FERRITIN, TIBC, IRON, RETICCTPCT in the last 72 hours. Sepsis Labs: No results for input(s): PROCALCITON, LATICACIDVEN in the last 168 hours.  Recent Results (from the past 240 hour(s))  Surgical pcr screen     Status: None   Collection Time: 03/04/18  9:04 PM  Result Value Ref Range Status   MRSA, PCR NEGATIVE NEGATIVE Final   Staphylococcus aureus NEGATIVE NEGATIVE Final    Comment: (NOTE) The Xpert SA Assay (FDA approved for NASAL specimens in patients 45 years of age and older), is one component of a comprehensive surveillance program. It is not intended to diagnose infection nor to guide or monitor treatment. Performed at [facility], [place] [address], [place], [place] [address]          Radiology Studies: Dg Abd Portable 1v  Result Date: 03/08/2018 CLINICAL DATA:  Nausea and vomiting EXAM: PORTABLE ABDOMEN - 1 VIEW COMPARISON:  CT 02/28/2018 FINDINGS: Surgical hardware in the upper lumbar spine. Surgical clips in the right upper quadrant. Nonobstructed bowel-gas pattern. Radiopaque material in the bladder. Probable diverticulum on the right side of the bladder. Status post right hip replacement with fixating screw paralleling the cortex of the right inter pelvis. IMPRESSION: 1. Nonobstructed gas pattern 2. Contrast material within the bladder Electronically Signed   By: Donavan Foil M.D.   On: 03/08/2018 22:30   Ct Angio Abd/pel W/ And/or W/o  Result Date: 03/09/2018 CLINICAL DATA:  Upper abdominal pain and nausea after mesenteric stent placement today. EXAM: CTA ABDOMEN  AND PELVIS wITHOUT AND WITH CONTRAST TECHNIQUE: Multidetector CT imaging of the abdomen and pelvis was performed using the standard protocol during bolus administration of intravenous contrast. Multiplanar reconstructed images and MIPs were obtained and reviewed to evaluate the vascular anatomy. CONTRAST:  100 cc Isovue 370 IV COMPARISON:  Abdomen/pelvis CT 02/28/2018 FINDINGS: VASCULAR Aorta: Moderate atherosclerosis. Normal caliber aorta without aneurysm, dissection, vasculitis or significant stenosis. Celiac: Plaque at the origin causes approximately 50% stenosis. No dissection or vasculitis. Distal branches are patent. SMA: Patent proximal SMA stent with resolved origin stenosis. Calcified distally but patent. No evidence of dissection. No evidence of contrast extravasation. Renals: Calcified plaque at the origins. No dissection or vasculitis. IMA: Patent without evidence of aneurysm, dissection, vasculitis or significant stenosis. Inflow: Patent without evidence of aneurysm, dissection, vasculitis or significant  stenosis. Proximal Outflow: Bilateral common femoral and visualized portions of the superficial and profunda femoral arteries are patent without evidence of aneurysm, dissection, vasculitis or significant stenosis. No evidence of pseudoaneurysm formation or active extravasation at the puncture site. Veins: Not well assessed on arterial phase imaging. There is presumed mixing of opacified and non-opacified blood in the mesenteric venous confluence. Review of the MIP images confirms the above findings. NON-VASCULAR Lower chest: Linear atelectasis in both lower lobes. Trace right pleural effusion. Coronary artery calcifications. Peripherally calcified left breast implant partially included. Hepatobiliary: Slight capsular nodularity raises concern for cirrhosis. No discrete focal lesion. Postcholecystectomy with grossly unchanged biliary prominence allowing for arterial phase technique. Pancreas:  Parenchymal atrophy. No ductal dilatation or inflammation. Spleen: Normal in size without focal abnormality. Adrenals/Urinary Tract: Mild left adrenal thickening. Normal right adrenal gland. Excreted IV contrast in both kidneys and urinary bladder from angiogram earlier this day. No hydronephrosis or perinephric edema. Bilateral renal cysts. Stomach/Bowel: Small hiatal hernia. Stomach is nondistended. Presence of intra-abdominal ascites limits bowel evaluation. There is colonic wall thickening involving the ascending colon, hepatic flexure, unlikely proximal transverse colon. No definite small bowel inflammation. Colonic diverticulosis throughout the colon. Lymphatic: No definite adenopathy. No evidence of retroperitoneal hemorrhage. Reproductive: Uterus obscured by right hip arthroplasty. Other: Development of moderate volume abdominopelvic ascites. Fluid may be minimally complex, however does not represent values concerning for hemorrhage. There is mild mesenteric edema. No free air. Small supraumbilical hernia contains fat and small amount of free fluid. Musculoskeletal: Postsurgical change in the lumbar spine, unchanged from prior. Right hip arthroplasty. IMPRESSION: VASCULAR 1. Placement of superior mesenteric artery stent which is widely patent. No evidence of dissection or postprocedural pseudoaneurysm/hematoma. 2. Diffuse aortic and branch atherosclerosis. NON-VASCULAR 1. Interval development of moderate abdominopelvic ascites from prior exam. Fluid is minimally complex but does not represent hemorrhage. 2. Colonic wall thickening involving the cecum, ascending, and proximal transverse colon. This is new since prior CT. Exact etiology is uncertain. Infectious or inflammatory etiologies are considered, colonic wall edema related to reperfusion could have a similar appearance. No increased density to suggest bowel wall hemorrhage. 3. Colonic diverticulosis without specific findings to suggest diverticulitis.  4. Question of nodular hepatic contours raises concern for cirrhosis. Recommend correlation with cirrhosis risk factors. Electronically Signed   By: Jeb Levering M.D.   On: 03/09/2018 03:18        Scheduled Meds: . allopurinol  100 mg Oral Daily  . cholecalciferol  1,000 Units Oral Daily  . citalopram  20 mg Oral TID  . clopidogrel  75 mg Oral Q breakfast  . feeding supplement  1 Container Oral TID BM  . ferrous sulfate  325 mg Oral Q breakfast  . lidocaine  1 patch Transdermal Q24H  . lidocaine      . pantoprazole  80 mg Oral Daily  . pilocarpine  1 drop Both Eyes BID  . pramipexole  0.25 mg Oral QHS  . roflumilast  500 mcg Oral Daily  . scopolamine  1 patch Transdermal Q72H  . sodium chloride flush  3 mL Intravenous Q12H  . sodium chloride flush  3 mL Intravenous Q12H  . traZODone  150 mg Oral QHS  . umeclidinium bromide  1 puff Inhalation Daily   Continuous Infusions: . sodium chloride    . sodium chloride 75 mL/hr at 03/10/18 0743  . sodium chloride    . heparin 1,200 Units/hr (03/10/18 1051)     LOS: 9 days    Time  spent: 35 minutes    Irine Seal, MD [facility] Pager [phone]  If 7PM-7AM, please contact night-coverage [email or web address] Password [facility] 03/10/2018, 12:06 PM

## 2018-03-11 DIAGNOSIS — E876 Hypokalemia: Secondary | ICD-10-CM

## 2018-03-11 LAB — TRIGLYCERIDES, BODY FLUIDS: Triglycerides, Fluid: 29 mg/dL

## 2018-03-11 LAB — COMPREHENSIVE METABOLIC PANEL
ALT: 19 U/L (ref 14–54)
AST: 21 U/L (ref 15–41)
Albumin: 2.6 g/dL — ABNORMAL LOW (ref 3.5–5.0)
Alkaline Phosphatase: 61 U/L (ref 38–126)
Anion gap: 8 (ref 5–15)
BUN: 11 mg/dL (ref 6–20)
CO2: 22 mmol/L (ref 22–32)
Calcium: 8.3 mg/dL — ABNORMAL LOW (ref 8.9–10.3)
Chloride: 109 mmol/L (ref 101–111)
Creatinine, Ser: 0.75 mg/dL (ref 0.44–1.00)
GFR calc Af Amer: >60 mL/min (ref >60)
GFR calc non Af Amer: >60 mL/min (ref >60)
Glucose, Bld: 109 mg/dL — ABNORMAL HIGH (ref 65–99)
Potassium: 2.9 mmol/L — ABNORMAL LOW (ref 3.5–5.1)
Sodium: 139 mmol/L (ref 135–145)
Total Bilirubin: 0.6 mg/dL (ref 0.3–1.2)
Total Protein: 5 g/dL — ABNORMAL LOW (ref 6.5–8.1)

## 2018-03-11 LAB — CBC WITH DIFFERENTIAL/PLATELET
Basophils Absolute: 0 10*3/uL (ref 0.0–0.1)
Basophils Relative: 0 %
Eosinophils Absolute: 0.1 10*3/uL (ref 0.0–0.7)
Eosinophils Relative: 1 %
HCT: 32.4 % — ABNORMAL LOW (ref 36.0–46.0)
Hemoglobin: 10.4 g/dL — ABNORMAL LOW (ref 12.0–15.0)
Lymphocytes Relative: 12 %
Lymphs Abs: 1.3 10*3/uL (ref 0.7–4.0)
MCH: 27.7 pg (ref 26.0–34.0)
MCHC: 32.1 g/dL (ref 30.0–36.0)
MCV: 86.4 fL (ref 78.0–100.0)
Monocytes Absolute: 1.2 10*3/uL — ABNORMAL HIGH (ref 0.1–1.0)
Monocytes Relative: 12 %
Neutro Abs: 7.9 10*3/uL — ABNORMAL HIGH (ref 1.7–7.7)
Neutrophils Relative %: 75 %
Platelets: 183 10*3/uL (ref 150–400)
RBC: 3.75 MIL/uL — ABNORMAL LOW (ref 3.87–5.11)
RDW: 16.3 % — ABNORMAL HIGH (ref 11.5–15.5)
WBC: 10.5 10*3/uL (ref 4.0–10.5)

## 2018-03-11 LAB — LIPASE, BLOOD: Lipase: 20 U/L (ref 11–51)

## 2018-03-11 LAB — IRON AND TIBC
Iron: 39 ug/dL (ref 28–170)
Saturation Ratios: 19 % (ref 10.4–31.8)
TIBC: 206 ug/dL — ABNORMAL LOW (ref 250–450)
UIBC: 167 ug/dL

## 2018-03-11 LAB — FERRITIN: Ferritin: 138 ng/mL (ref 11–307)

## 2018-03-11 LAB — FOLATE: Folate: 21.3 ng/mL (ref >5.9)

## 2018-03-11 LAB — HEPARIN LEVEL (UNFRACTIONATED): Heparin Unfractionated: 0.59 IU/mL (ref 0.30–0.70)

## 2018-03-11 LAB — VITAMIN B12: Vitamin B-12: 258 pg/mL (ref 180–914)

## 2018-03-11 MED ORDER — PRO-STAT SUGAR FREE PO LIQD
30.0000 mL | Freq: Two times a day (BID) | ORAL | Status: DC
  Administered 2018-03-11 (×2): 30 mL via ORAL
  Filled 2018-03-11 (×7): qty 30

## 2018-03-11 MED ORDER — POTASSIUM CHLORIDE 10 MEQ/100ML IV SOLN
10.0000 meq | INTRAVENOUS | Status: AC
  Administered 2018-03-11 (×4): 10 meq via INTRAVENOUS
  Filled 2018-03-11 (×4): qty 100

## 2018-03-11 MED ORDER — ADULT MULTIVITAMIN W/MINERALS CH
1.0000 | ORAL_TABLET | Freq: Every day | ORAL | Status: DC
  Administered 2018-03-11 – 2018-03-13 (×3): 1 via ORAL
  Filled 2018-03-11 (×4): qty 1

## 2018-03-11 MED ORDER — PROCHLORPERAZINE EDISYLATE 5 MG/ML IJ SOLN
10.0000 mg | INTRAMUSCULAR | Status: DC | PRN
  Administered 2018-03-11: 10 mg via INTRAVENOUS
  Filled 2018-03-11 (×2): qty 2

## 2018-03-11 MED ORDER — POTASSIUM CHLORIDE CRYS ER 20 MEQ PO TBCR
40.0000 meq | EXTENDED_RELEASE_TABLET | ORAL | Status: DC
  Filled 2018-03-11: qty 2

## 2018-03-11 NOTE — Consult Note (Signed)
[facility]  Subjective  - still feels nauseous, pain at paracentesis site   Objective (!) 136/48 100 99.8 F (37.7 C) (Oral) 18 98%  Intake/Output Summary (Last 24 hours) at 03/11/2018 0924 Last data filed at 03/11/2018 0843 Gross per 24 hour  Intake 3122.95 ml  Output 1500 ml  Net 1622.95 ml   Abdomen soft, minimal tenderness  Assessment/Planning: Still with nausea, WBC improved hopefully will resolve over next few days Appreciate GI and primary team involvement  [name] 03/11/2018 9:24 AM --  Laboratory Lab Results: Recent Labs    03/10/18 0503 03/11/18 0337  WBC 16.2* 10.5  HGB 13.0 10.4*  HCT 40.4 32.4*  PLT 235 183   BMET Recent Labs    03/10/18 0503 03/11/18 0337  NA 138 139  K 4.0 2.9*  CL 107 109  CO2 21* 22  GLUCOSE 119* 109*  BUN 24* 11  CREATININE 1.05* 0.75  CALCIUM 8.8* 8.3*    COAG Lab Results  Component Value Date   INR 1.16 02/13/2017   INR 1.45 04/26/2014   INR 0.97 02/10/2013   No results found for: PTT

## 2018-03-11 NOTE — Progress Notes (Signed)
[place] for Heparin Indication: DVT, PE  Allergies  Allergen Reactions  . Zolpidem Tartrate Anxiety    CONFUSION   . Pitavastatin Other (See Comments)    Not sure  . Ropinirole Nausea Only    Makes legs jump  . Penicillins Rash   Patient Measurements: Height: 5\' 8"  (172.7 cm) Weight: 181 lb 1.6 oz (82.1 kg) IBW/kg (Calculated) : 63.9 Heparin Dosing Weight: 77.8 kg  Vital Signs: Temp: 99.8 F (37.7 C) (03/21 0443) Temp Source: Oral (03/21 0443) BP: 136/48 (03/21 0443) Pulse Rate: 102 (03/21 0953)  Labs: Recent Labs    03/09/18 0456  03/10/18 0503 03/10/18 0852 03/10/18 1834 03/11/18 0337  HGB  --   --  13.0  --   --  10.4*  HCT  --   --  40.4  --   --  32.4*  PLT  --   --  235  --   --  183  HEPARINUNFRC  --    < > 1.00* 0.94* 0.63 0.59  CREATININE 0.93  --  1.05*  --   --  0.75   < > = values in this interval not displayed.    Assessment: 71 yoF presents with acute vs. chronic PE with R heart strain, also with LLE DVT.  Continues on heparin while Xarelto on hold.  S/p SMA stent with ongoing nausea.  No bleeding noted  Heparin level therapeutic this AM    Goal of Therapy:  Heparin level 0.3-0.7 units/ml Monitor platelets by anticoagulation protocol: Yes   Plan:  Continue heparin at 1200 units / hr Daily heparin level, CBC  Thank you Anette Guarneri, PharmD [phone]

## 2018-03-11 NOTE — Progress Notes (Signed)
Subjective: Abdominal pain and nausea slowly improving.  Objective: Vital signs in last 24 hours: Temp:  [98.7 F (37.1 C)-99.8 F (37.7 C)] 99.8 F (37.7 C) (03/21 0443) Pulse Rate:  [81-102] 102 (03/21 0953) Resp:  [18-20] 18 (03/21 0953) BP: (134-136)/(48-55) 136/48 (03/21 0443) SpO2:  [98 %-99 %] 98 % (03/21 0953) Weight:  [181 lb 1.6 oz (82.1 kg)] 181 lb 1.6 oz (82.1 kg) (03/21 0443) Weight change: 1 lb 8 oz (0.68 kg) Last BM Date: 03/11/18  PE: GEN:  NAD ABD:  Soft, mild generalized tenderness, no peritonitis  Lab Results: CBC    Component Value Date/Time   WBC 10.5 03/11/2018 0337   RBC 3.75 (L) 03/11/2018 0337   HGB 10.4 (L) 03/11/2018 0337   HGB 12.9 08/17/2017 1038   HCT 32.4 (L) 03/11/2018 0337   HCT 38.2 08/17/2017 1038   PLT 183 03/11/2018 0337   PLT 237 08/17/2017 1038   MCV 86.4 03/11/2018 0337   MCV 88 08/17/2017 1038   MCH 27.7 03/11/2018 0337   MCHC 32.1 03/11/2018 0337   RDW 16.3 (H) 03/11/2018 0337   RDW 15.7 (H) 08/17/2017 1038   LYMPHSABS 1.3 03/11/2018 0337   MONOABS 1.2 (H) 03/11/2018 0337   EOSABS 0.1 03/11/2018 0337   BASOSABS 0.0 03/11/2018 0337   CMP     Component Value Date/Time   NA 139 03/11/2018 0337   NA 144 09/14/2017 0921   K 2.9 (L) 03/11/2018 0337   CL 109 03/11/2018 0337   CO2 22 03/11/2018 0337   GLUCOSE 109 (H) 03/11/2018 0337   BUN 11 03/11/2018 0337   BUN 12 09/14/2017 0921   CREATININE 0.75 03/11/2018 0337   CALCIUM 8.3 (L) 03/11/2018 0337   PROT 5.0 (L) 03/11/2018 0337   ALBUMIN 2.6 (L) 03/11/2018 0337   AST 21 03/11/2018 0337   ALT 19 03/11/2018 0337   ALKPHOS 61 03/11/2018 0337   BILITOT 0.6 03/11/2018 0337   GFRNONAA >60 03/11/2018 0337   GFRAA >60 03/11/2018 0337   Assessment:  1.  Abdominal pain after SMA stent. 2.  Suspected ischemia to proximal colon and ascites from colonic ischemia.  Plan:  1.  Follow CBC. 2.  Replete Potassium. 3.  Supportive care. 4.  Patient not hungry; hopefully can  try some clear liquids tomorrow. 5.  [facility] will follow.   Landry Dyke 03/11/2018, 12:13 PM   Cell [phone] If no answer or after 5 PM call [phone]

## 2018-03-11 NOTE — Progress Notes (Signed)
PROGRESS NOTE    [name]  AST:[id number] DOB: 1941-11-05 DOA: 02/28/2018 PCP: [name], MD    Brief Narrative:  [name] is a 77 year old female with past medical history of CAD, PVD and COPD, hx small bowel AVM and had been off anticoagulation since Feb 2018, had worsening left hip issues that have gotten to the point where she has not ambulated much over the past month and now presented to the emergency room on 3/10 with complaints of nausea, weakness and dyspnea on exertion and found to be in acute respiratory failure with hypoxia. Workup discovered multiple subacute pulmonary embolus on CT scanas well as a large acute DVT in her left femoral vein. 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 Patient started on heparin gtt and admitted to the hospitalist service. She was evaluated by vascular surgery in regards to her questionable mesenteric artery stenosis.  She underwent mesenteric angiogram on 3/15, however was a unable to stent SMA. Re-attempted on 3/18 and had stent placed. Since stent placement, she has had worsening abdominal pain, nausea, vomiting. Repeat CTA abd/pelvis revealed patent SMA stent with moderate abdominopelvic ascites, colonic wall thickening. GI consulted.      Assessment & Plan:   Principal Problem:   Pulmonary embolism (HCC) Active Problems:   Colitis   Abnormal CT of the abdomen   Hypothyroidism   Essential hypertension   COPD (chronic obstructive pulmonary disease) (HCC)   CAD (coronary artery disease)   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HCC)   Acute on chronic respiratory failure with hypoxia (HCC)   AKI (acute kidney injury) ([place])   Ascites  #1 PE and acute left femoral DVT Patient with prior history of DVT postop 15 years ago was on anticoagulation with Coumadin at that time however this was discontinued in February 2018 secondary to GI bleed/AVM.  Patient with poor ambulatory status due to chronic hip pain.   2D echo done negative for right heart strain.  2D echo with normal EF.  Patient currently on IV heparin drip.  Will defer to vascular surgery when okay to transition to Xarelto.  Patient with no overt bleeding on IV heparin.  She is noted to have black stools per RN and as such we will check a FOBT.  Patient currently with nausea and emesis and as such we will hold on transitioning to an oral NOAC.  2.  Superior mesenteric artery stenosis Status post angiogram March 05, 2018 however unable to have stents placed at that time.  Angiogram was reattempted 03/08/2018 with successful stent placement in the SMA.  Repeat CT angiogram abdomen and pelvis with patent stents.  Per vascular surgery.  3.  Moderate abdominal pelvic ascites/colonic wall thickening Patient noted to have upper abdominal pain and noted to have worsening symptoms since stent placement of SMA.  Patient with nausea multiple episodes of emesis today.  CT angiogram abdomen and pelvis was completed with some abnormal results noted of colonic wall thickening involving the cecum, ascending and proximal transverse colon.  Also noted was nodular hepatic contours.  Colonic diverticulosis.  GI was consulted and is currently following.  Patient noted to have a leukocytosis up to 10,500 from 16,000 from normal yesterday.  Patient initially started on IV ciprofloxacin and IV Flagyl empirically as patient with some nausea and emesis and a leukocytosis on 03/10/2018.  Per GI, likely ischemic injury to proximal colon during stenting and transudative ascites in reaction to that injury recommending symptomatic care at this  time given absence of fever.  IV antibiotics were discontinued after 1 dose on 03/10/2018.  Keep on clear liquids.  Increase IV fluids to normal saline 100 cc/h.  Monitor off antibiotics per GI.  GI following.   4.  Hypertension Blood pressure stable.    5.  COPD Currently stable.  6.  History of small bowel AVM Diagnosed in 2018.  Patient  with no acute symptoms of bleeding and doing okay on heparin drip.  Monitor closely.  7.  Chronic left hip pain secondary to degenerative joint disease Symptomatic treatment.  Continue current pain control.  Outpatient follow-up with orthopedic surgery.  8.  Hypokalemia Secondary to GI losses.  Replete.     DVT prophylaxis: Heparin drip. Code Status: Full Family Communication: Updated patient.  No family at bedside.   Disposition Plan: Likely home with home health once patient has improved clinically with no further abdominal pain and tolerating a solid diet and per GI and vascular surgery.   Consultants:   Gastroenterology: Dr.Brahmbhatt 03/09/2018  Vascular surgery: Dr. [name] 03/01/2018  Procedures:   CT abdomen and pelvis 03/01/2018  CT angiogram chest 03/01/2018  CT angiogram abdomen and pelvis with and without contrast 03/09/2018  Abdominal films 03/08/2018  2D echo 03/02/2018  Lower extremity Dopplers 03/01/2018  Ultrasound-guided right lateral abdominal paracentesis with 100 cc of clear red fluid removed per Dr. Vernard Gambles 03/10/2018  Abdominal aortogram selective superior mesenteric angiogram attempted superior mesenteric artery stent placement per Dr. [name] 03/05/2018  Ultrasound-guided cannulation of right common femoral artery, stent of SMA with 6 x 39 mm VBX, percutaneous closure of right common femoral artery with mynx per Dr. Donzetta Matters 03/08/2018  Antimicrobials:   IV ciprofloxacin 03/10/2018>>>> 03/10/2018  IV Flagyl 03/10/2018>>>>>> 03/10/2018   Subjective: Patient sleeping.  Per RN and patient patient with episodes of emesis this morning as well as nausea.  Patient had bowel movement today.  No shortness of breath.  No chest pain.   Objective: Vitals:   03/10/18 1129 03/10/18 2221 03/11/18 0443 03/11/18 0953  BP: 117/61 (!) 134/55 (!) 136/48   Pulse: 90 81 100 (!) 102  Resp: 18 20 18 18   Temp: 98.4 F (36.9 C) 98.7 F (37.1 C) 99.8 F (37.7 C)   TempSrc:  Oral Oral Oral   SpO2: 94% 99% 98% 98%  Weight:   82.1 kg (181 lb 1.6 oz)   Height:        Intake/Output Summary (Last 24 hours) at 03/11/2018 1215 Last data filed at 03/11/2018 0843 Gross per 24 hour  Intake 2882.95 ml  Output 1500 ml  Net 1382.95 ml   Filed Weights   03/09/18 0502 03/10/18 0457 03/11/18 0443  Weight: 81.7 kg (180 lb 3.2 oz) 81.5 kg (179 lb 9.6 oz) 82.1 kg (181 lb 1.6 oz)    Examination:  General exam: Sleeping Respiratory system: Clear to auscultation anterior lung fields.  No wheezing, no crackles, no rhonchi.Marland Kitchen Respiratory effort normal. Cardiovascular system: Regular rate and rhythm no murmurs rubs or gallops.  No JVD.  No lower extremity edema.  Gastrointestinal system: Abdomen is soft, nondistended, some soreness to palpation in the upper abdomen.  No rebound.  No guarding.  Positive bowel sounds.  Central nervous system: Alert and oriented. No focal neurological deficits. Extremities: Symmetric 5 x 5 power. Skin: No rashes, lesions or ulcers Psychiatry: Judgement and insight appear normal. Mood & affect appropriate.     Data Reviewed: I have personally reviewed following labs and imaging studies  CBC: Recent  Labs  Lab 03/06/18 0439 03/07/18 0515 03/08/18 0332 03/10/18 0503 03/11/18 0337  WBC 7.9 7.1 7.0 16.2* 10.5  NEUTROABS  --   --   --   --  7.9*  HGB 11.6* 10.0* 10.5* 13.0 10.4*  HCT 36.3 31.7* 33.1* 40.4 32.4*  MCV 87.5 87.1 88.0 86.7 86.4  PLT 154 144* 149* 235 921   Basic Metabolic Panel: Recent Labs  Lab 03/07/18 0515 03/08/18 0332 03/09/18 0456 03/10/18 0503 03/11/18 0337  NA 140 138 139 138 139  K 3.4* 3.9 4.1 4.0 2.9*  CL 109 108 110 107 109  CO2 24 24 20* 21* 22  GLUCOSE 98 103* 163* 119* 109*  BUN 11 13 16  24* 11  CREATININE 0.76 0.83 0.93 1.05* 0.75  CALCIUM 8.9 9.1 8.8* 8.8* 8.3*  MG 1.4* 2.0  --   --   --    GFR: Estimated Creatinine Clearance: 66.2 mL/min (by C-G formula based on SCr of 0.75 mg/dL). Liver  Function Tests: Recent Labs  Lab 03/11/18 0337  AST 21  ALT 19  ALKPHOS 61  BILITOT 0.6  PROT 5.0*  ALBUMIN 2.6*   Recent Labs  Lab 03/11/18 0337  LIPASE 20   No results for input(s): AMMONIA in the last 168 hours. Coagulation Profile: No results for input(s): INR, PROTIME in the last 168 hours. Cardiac Enzymes: No results for input(s): CKTOTAL, CKMB, CKMBINDEX, TROPONINI in the last 168 hours. BNP (last 3 results) Recent Labs    08/17/17 1038 09/14/17 0921  PROBNP 612 503   HbA1C: No results for input(s): HGBA1C in the last 72 hours. CBG: No results for input(s): GLUCAP in the last 168 hours. Lipid Profile: No results for input(s): CHOL, HDL, LDLCALC, TRIG, CHOLHDL, LDLDIRECT in the last 72 hours. Thyroid Function Tests: No results for input(s): TSH, T4TOTAL, FREET4, T3FREE, THYROIDAB in the last 72 hours. Anemia Panel: Recent Labs    03/11/18 0937  VITAMINB12 258  FOLATE 21.3  FERRITIN 138  TIBC 206*  IRON 39   Sepsis Labs: No results for input(s): PROCALCITON, LATICACIDVEN in the last 168 hours.  Recent Results (from the past 240 hour(s))  Surgical pcr screen     Status: None   Collection Time: 03/04/18  9:04 PM  Result Value Ref Range Status   MRSA, PCR NEGATIVE NEGATIVE Final   Staphylococcus aureus NEGATIVE NEGATIVE Final    Comment: (NOTE) The Xpert SA Assay (FDA approved for NASAL specimens in patients 59 years of age and older), is one component of a comprehensive surveillance program. It is not intended to diagnose infection nor to guide or monitor treatment. Performed at [facility], [place] [address] [place], [place] [address]   Gram stain     Status: None   Collection Time: 03/10/18 10:15 AM  Result Value Ref Range Status   Specimen Description PERITONEAL  Final   Special Requests NONE  Final   Gram Stain   Final    RARE WBC PRESENT, PREDOMINANTLY MONONUCLEAR NO ORGANISMS SEEN Performed at [facility], [address] [place], [place] [address]    Report Status 03/10/2018 FINAL  Final         Radiology Studies: Ir Paracentesis  Result Date: 03/10/2018 INDICATION: Recent superior mesenteric artery stent placement. New ascites. Request for diagnostic paracentesis. EXAM: ULTRASOUND GUIDED RIGHT LATERAL ABDOMEN PARACENTESIS MEDICATIONS: 2% lidocaine = 10 mL COMPLICATIONS: None immediate. PROCEDURE: Informed written consent was obtained from the patient after a discussion of the  risks, benefits and alternatives to treatment. A timeout was performed prior to the initiation of the procedure. Initial ultrasound scanning demonstrates a small amount of ascites within the right lateral abdomen. The right lateral abdomen was prepped and draped in the usual sterile fashion. 1% lidocaine with epinephrine was used for local anesthesia. Following this, a 19 gauge, 7-cm, Yueh catheter was introduced. An ultrasound image was saved for documentation purposes. The paracentesis was performed. The catheter was removed and a dressing was applied. The patient tolerated the procedure well without immediate post procedural complication. FINDINGS: A total of approximately 100 ml of clear red fluid was removed. Samples were sent to the laboratory as requested by the clinical team. IMPRESSION: Successful ultrasound-guided paracentesis yielding 139mL of peritoneal fluid. Read by: Gareth Eagle, PA-C Electronically Signed   By: Lucrezia Europe M.D.   On: 03/10/2018 11:13        Scheduled Meds: . allopurinol  100 mg Oral Daily  . cholecalciferol  1,000 Units Oral Daily  . citalopram  20 mg Oral TID  . clopidogrel  75 mg Oral Q breakfast  . feeding supplement  1 Container Oral TID BM  . ferrous sulfate  325 mg Oral Q breakfast  . lidocaine  1 patch Transdermal Q24H  . pantoprazole  80 mg Oral Daily  . pilocarpine  1 drop Both Eyes BID  . pramipexole  0.25 mg Oral QHS  . roflumilast  500 mcg Oral Daily  . scopolamine  1 patch Transdermal  Q72H  . sodium chloride flush  3 mL Intravenous Q12H  . sodium chloride flush  3 mL Intravenous Q12H  . traZODone  150 mg Oral QHS  . umeclidinium bromide  1 puff Inhalation Daily   Continuous Infusions: . sodium chloride    . sodium chloride 100 mL/hr at 03/11/18 1043  . sodium chloride    . heparin 1,200 Units/hr (03/11/18 0115)  . potassium chloride 10 mEq (03/11/18 1147)     LOS: 10 days    Time spent: 40 minutes    Irine Seal, MD [facility] Pager [phone]  If 7PM-7AM, please contact night-coverage [email or web address] Password [facility] 03/11/2018, 12:15 PM

## 2018-03-11 NOTE — Progress Notes (Signed)
Nutrition Follow-up  DOCUMENTATION CODES:   Not applicable  INTERVENTION:   -Continue Boost Breeze po TID, each supplement provides 250 kcal and 9 grams of protein -30 ml Prostat BID, each supplement provides 100 kcals and 15 grams protein -MVI daily  NUTRITION DIAGNOSIS:   Increased nutrient needs related to chronic illness as evidenced by estimated needs.  Ongoing  GOAL:   Patient will meet greater than or equal to 90% of their needs  Progressing  MONITOR:   PO intake, Supplement acceptance, Labs, Weight trends, Skin, I & O's  REASON FOR ASSESSMENT:   Consult Assessment of nutrition requirement/status  ASSESSMENT:   [name] is a 77 y.o. female with medical history significant of CAD s/p CABG 5809, diastolic CHF with EF 98%, COPD currently on PRN O2 at home, prior DVT not currently anticoagulated due to GIB last year.  Patient presents to the ED with c/o 2-3 weeks of nausea, generalized weakness, SOB on exertion, needing to use more O2 at home on exertion than baseline.  3/15- s/p Procedure: Abdominal aortogram selective superior mesenteric angiogram attempted superior mesenteric artery stent 3/18- s/p procedure: US guided cannulation of right common femoral artery, Stent of sma with 6 x 3mm vbx, Percutaneous closure of right common femoral artery with mynx 3/19- CT revealed colonic wall thickening 3/20- s/p rt paracentesis- 100 ml yielded; per GI notes, possible ischemic injury to proximal colon  Pt sleeping soundly at time of visit. Unable to arouse pt to obtain further history.   Observed meal tray; pt consumed only about 25% (50% of two juices). Overall meal completion 50-100%. Also consumed about 1/3 of Boost Breeze supplement on tray table, which pt likes has been accepting per [facility].   Labs reviewed: K: 2.9 (on IV supplementation).   Diet Order:  Diet clear liquid Room service appropriate? Yes; Fluid consistency: Thin  EDUCATION NEEDS:   Education  needs have been addressed  Skin:  Skin Assessment: Reviewed RN Assessment  Last BM:  03/11/18  Height:   Ht Readings from Last 1 Encounters:  03/01/18 5\' 8"  (1.727 m)    Weight:   Wt Readings from Last 1 Encounters:  03/11/18 181 lb 1.6 oz (82.1 kg)    Ideal Body Weight:  63.6 kg  BMI:  Body mass index is 27.54 kg/m.  Estimated Nutritional Needs:   Kcal:  1700-1900  Protein:  85-100 grams  Fluid:  1.7-1.9 L    Fred Franzen A. Jimmye Norman, RD, LDN, CDE Pager: [phone] After hours Pager: [phone]

## 2018-03-11 NOTE — Progress Notes (Signed)
Potassium 2.9 this morning, no potassium ordered. Paged NP on call K. Schorr.  Kelani Robart, RN

## 2018-03-12 ENCOUNTER — Inpatient Hospital Stay (HOSPITAL_COMMUNITY): Payer: Medicare HMO

## 2018-03-12 LAB — BASIC METABOLIC PANEL
Anion gap: 8 (ref 5–15)
Anion gap: 7 (ref 5–15)
BUN: 7 mg/dL (ref 6–20)
BUN: 6 mg/dL (ref 6–20)
CO2: 23 mmol/L (ref 22–32)
CO2: 21 mmol/L — ABNORMAL LOW (ref 22–32)
Calcium: 8.1 mg/dL — ABNORMAL LOW (ref 8.9–10.3)
Calcium: 8 mg/dL — ABNORMAL LOW (ref 8.9–10.3)
Chloride: 110 mmol/L (ref 101–111)
Chloride: 110 mmol/L (ref 101–111)
Creatinine, Ser: 0.61 mg/dL (ref 0.44–1.00)
Creatinine, Ser: 0.6 mg/dL (ref 0.44–1.00)
GFR calc Af Amer: >60 mL/min (ref >60)
GFR calc Af Amer: >60 mL/min (ref >60)
GFR calc non Af Amer: >60 mL/min (ref >60)
GFR calc non Af Amer: >60 mL/min (ref >60)
Glucose, Bld: 95 mg/dL (ref 65–99)
Glucose, Bld: 106 mg/dL — ABNORMAL HIGH (ref 65–99)
Potassium: 2.8 mmol/L — ABNORMAL LOW (ref 3.5–5.1)
Potassium: 3.1 mmol/L — ABNORMAL LOW (ref 3.5–5.1)
Sodium: 141 mmol/L (ref 135–145)
Sodium: 138 mmol/L (ref 135–145)

## 2018-03-12 LAB — CBC
HCT: 29 % — ABNORMAL LOW (ref 36.0–46.0)
Hemoglobin: 9.4 g/dL — ABNORMAL LOW (ref 12.0–15.0)
MCH: 28.4 pg (ref 26.0–34.0)
MCHC: 32.4 g/dL (ref 30.0–36.0)
MCV: 87.6 fL (ref 78.0–100.0)
Platelets: 167 10*3/uL (ref 150–400)
RBC: 3.31 MIL/uL — ABNORMAL LOW (ref 3.87–5.11)
RDW: 16.6 % — ABNORMAL HIGH (ref 11.5–15.5)
WBC: 7 10*3/uL (ref 4.0–10.5)

## 2018-03-12 LAB — MAGNESIUM: Magnesium: 1.6 mg/dL — ABNORMAL LOW (ref 1.7–2.4)

## 2018-03-12 LAB — HEPARIN LEVEL (UNFRACTIONATED): Heparin Unfractionated: 0.41 IU/mL (ref 0.30–0.70)

## 2018-03-12 MED ORDER — POTASSIUM CHLORIDE 10 MEQ/100ML IV SOLN
10.0000 meq | INTRAVENOUS | Status: AC
  Administered 2018-03-12 (×4): 10 meq via INTRAVENOUS
  Filled 2018-03-12: qty 100

## 2018-03-12 MED ORDER — POTASSIUM CHLORIDE 10 MEQ/100ML IV SOLN
10.0000 meq | INTRAVENOUS | Status: AC
  Administered 2018-03-12 (×2): 10 meq via INTRAVENOUS
  Filled 2018-03-12: qty 100

## 2018-03-12 MED ORDER — MAGNESIUM SULFATE 4 GM/100ML IV SOLN
4.0000 g | Freq: Once | INTRAVENOUS | Status: AC
  Administered 2018-03-12: 4 g via INTRAVENOUS
  Filled 2018-03-12: qty 100

## 2018-03-12 MED ORDER — KETOROLAC TROMETHAMINE 30 MG/ML IJ SOLN
30.0000 mg | Freq: Four times a day (QID) | INTRAMUSCULAR | Status: DC | PRN
  Administered 2018-03-12 – 2018-03-13 (×3): 30 mg via INTRAVENOUS
  Filled 2018-03-12 (×3): qty 1

## 2018-03-12 MED ORDER — PROCHLORPERAZINE EDISYLATE 5 MG/ML IJ SOLN
10.0000 mg | INTRAMUSCULAR | Status: DC | PRN
  Administered 2018-03-12 – 2018-03-18 (×15): 10 mg via INTRAVENOUS
  Filled 2018-03-12 (×19): qty 2

## 2018-03-12 MED ORDER — POTASSIUM CHLORIDE 10 MEQ/100ML IV SOLN
10.0000 meq | INTRAVENOUS | Status: DC
  Administered 2018-03-12 (×2): 10 meq via INTRAVENOUS
  Filled 2018-03-12: qty 100

## 2018-03-12 MED ORDER — PANTOPRAZOLE SODIUM 40 MG PO TBEC
40.0000 mg | DELAYED_RELEASE_TABLET | Freq: Every day | ORAL | Status: DC
  Administered 2018-03-13 – 2018-03-26 (×13): 40 mg via ORAL
  Filled 2018-03-12 (×12): qty 1

## 2018-03-12 NOTE — Progress Notes (Signed)
[place] for Heparin Indication: DVT, PE  Allergies  Allergen Reactions  . Zolpidem Tartrate Anxiety    CONFUSION   . Pitavastatin Other (See Comments)    Not sure  . Ropinirole Nausea Only    Makes legs jump  . Penicillins Rash   Patient Measurements: Height: 5\' 8"  (172.7 cm) Weight: 183 lb 3.2 oz (83.1 kg) IBW/kg (Calculated) : 63.9 Heparin Dosing Weight: 77.8 kg  Vital Signs: Temp: 98 F (36.7 C) (03/22 0452) Temp Source: Oral (03/22 0452) BP: 149/68 (03/22 0452) Pulse Rate: 83 (03/22 0938)  Labs: Recent Labs    03/10/18 0503  03/10/18 1834 03/11/18 0337 03/12/18 0509 03/12/18 0512  HGB 13.0  --   --  10.4* 9.4*  --   HCT 40.4  --   --  32.4* 29.0*  --   PLT 235  --   --  183 167  --   HEPARINUNFRC 1.00*   < > 0.63 0.59  --  0.41  CREATININE 1.05*  --   --  0.75 0.61  --    < > = values in this interval not displayed.    Assessment: 69 yoF presents with acute vs. chronic PE with R heart strain, also with LLE DVT.  Continues on heparin while Xarelto on hold.  S/p SMA stent with ongoing nausea.   Heparin level therapeutic this AM HgB down to 9.4  Goal of Therapy:  Heparin level 0.3-0.7 units/ml Monitor platelets by anticoagulation protocol: Yes   Plan:  Continue heparin at 1200 units / hr Daily heparin level, CBC  Thank you Anette Guarneri, PharmD [phone]

## 2018-03-12 NOTE — Progress Notes (Signed)
Potassium 2.8 this morning. Paged NP on call K. Schor.  Awaiting on orders.  Eshal Propps, RN

## 2018-03-12 NOTE — Progress Notes (Signed)
Patient's daughter at bedside and requested an update from Dr. Grandville Silos.  Dr. Grandville Silos informed and came to room to provide updates to patient's daughter.

## 2018-03-12 NOTE — Progress Notes (Signed)
RN Butch Penny called regarding pt's increase pain uncontrolled with 5 PRN medications. GI had previously evaluated pt with similar documentation. Pt lying supine in bed with eyes closed, resting comfortably, skin warm and dry, alert and oriented x4. Pt states "I feel so much better now, I just could not tolerate that pain. It's the worst I've ever had."  Dr. Grandville Silos put in new orders for abd scan and Toradol 30 mg IVP. He is coming to bedside to evaluate pt. No interventions from RRT. Please call for any concerns.

## 2018-03-12 NOTE — Progress Notes (Signed)
PROGRESS NOTE    [name]  YTK:[id number] DOB: 1941/01/04 DOA: 02/28/2018 PCP: [name], MD    Brief Narrative:  [name] is a 77 year old female with past medical history of CAD, PVD and COPD, hx small bowel AVM and had been off anticoagulation since Feb 2018, had worsening left hip issues that have gotten to the point where she has not ambulated much over the past month and now presented to the emergency room on 3/10 with complaints of nausea, weakness and dyspnea on exertion and found to be in acute respiratory failure with hypoxia. Workup discovered multiple subacute pulmonary embolus on CT scanas well as a large acute DVT in her left femoral vein. 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 Patient started on heparin gtt and admitted to the hospitalist service. She was evaluated by vascular surgery in regards to her questionable mesenteric artery stenosis.  She underwent mesenteric angiogram on 3/15, however was a unable to stent SMA. Re-attempted on 3/18 and had stent placed. Since stent placement, she has had worsening abdominal pain, nausea, vomiting. Repeat CTA abd/pelvis revealed patent SMA stent with moderate abdominopelvic ascites, colonic wall thickening. GI consulted.      Assessment & Plan:   Principal Problem:   Pulmonary embolism (HCC) Active Problems:   Colitis   Abnormal CT of the abdomen   Hypothyroidism   Essential hypertension   COPD (chronic obstructive pulmonary disease) (HCC)   CAD (coronary artery disease)   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HCC)   Acute on chronic respiratory failure with hypoxia (HCC)   AKI (acute kidney injury) ([place])   Ascites  #1 PE and acute left femoral DVT Patient with prior history of DVT postop 15 years ago was on anticoagulation with Coumadin at that time however this was discontinued in February 2018 secondary to GI bleed/AVM.  Patient with poor ambulatory status due to chronic hip pain.   2D echo done negative for right heart strain.  2D echo with normal EF.  Patient currently on IV heparin drip.  The IV drip for now until patient is improved and tolerating a solid diet then will transition to Xarelto.  Patient with no overt bleeding on IV heparin.  She is noted to have black stools per RN and as such FOBT was ordered however not done. Patient currently with nausea and emesis and as such we will hold on transitioning to an oral NOAC.  2.  Superior mesenteric artery stenosis Status post angiogram March 05, 2018 however unable to have stents placed at that time.  Angiogram was reattempted 03/08/2018 with successful stent placement in the SMA.  Repeat CT angiogram abdomen and pelvis with patent stents.  Has been seen by vascular surgery who are currently recommending outpatient follow-up.  Per vascular surgery.  3.  Moderate abdominal pelvic ascites/colonic wall thickening Patient noted to have upper abdominal pain and noted to have worsening symptoms since stent placement of SMA.  Patient with nausea multiple episodes of emesis yesterday 03/11/2018.  Patient complains of nausea today however tolerated broth.  This afternoon patient complained of worsening abdominal pain despite as needed pain medications.  Patient's abdomen is soft with tenderness to palpation in the epigastrium and left upper quadrant regions.  No rebound.   CT angiogram abdomen and pelvis was completed with some abnormal results noted of colonic wall thickening involving the cecum, ascending and proximal transverse colon.  Also noted was nodular hepatic contours.  Colonic diverticulosis.  GI was consulted  and is currently following.  Patient noted to have a leukocytosis is trending back down and currently at 7000 from 10,500 from 16,000 (03/10/2018).  Patient initially started on IV ciprofloxacin and IV Flagyl empirically as patient with some nausea and emesis and a leukocytosis on 03/10/2018.  Per GI, likely ischemic injury to  proximal colon during stenting and transudative ascites in reaction to that injury recommending symptomatic care at this time given absence of fever.  IV antibiotics were discontinued after 1 dose on 03/10/2018.  Keep on clear liquids.  Increased IV fluids to normal saline 100 cc/h.  We will repeat abdominal films.  Monitor off antibiotics per GI.  GI following.   4.  Hypertension Blood pressure stable.    5.  COPD Stable.  Continue Ellipta, Daliresp.  Follow.    6.  History of small bowel AVM Diagnosed in 2018.  Patient with no acute symptoms of bleeding and doing okay on heparin drip.  Monitor closely.  7.  Chronic left hip pain secondary to degenerative joint disease Symptomatic treatment.  Continue current pain control.  Outpatient follow-up with orthopedic surgery.  8.  Hypokalemia/hypomagnesemia Secondary to GI losses.  Replete.     DVT prophylaxis: Heparin drip. Code Status: Full Family Communication: Updated patient and sister and brother-in law and daughter at bedside. Disposition Plan: Likely home with home health once patient has improved clinically with no further abdominal pain and tolerating a solid diet and per GI and vascular surgery.   Consultants:   Gastroenterology: Dr.Brahmbhatt 03/09/2018  Vascular surgery: Dr. [name] 03/01/2018  Procedures:   CT abdomen and pelvis 03/01/2018  CT angiogram chest 03/01/2018  CT angiogram abdomen and pelvis with and without contrast 03/09/2018  Abdominal films 03/08/2018  2D echo 03/02/2018  Lower extremity Dopplers 03/01/2018  Ultrasound-guided right lateral abdominal paracentesis with 100 cc of clear red fluid removed per Dr. Vernard Gambles 03/10/2018  Abdominal aortogram selective superior mesenteric angiogram attempted superior mesenteric artery stent placement per Dr. [name] 03/05/2018  Ultrasound-guided cannulation of right common femoral artery, stent of SMA with 6 x 39 mm VBX, percutaneous closure of right common femoral  artery with mynx per Dr. Donzetta Matters 03/08/2018  Abdominal films 03/12/2018 pending  Antimicrobials:   IV ciprofloxacin 03/10/2018>>>> 03/10/2018  IV Flagyl 03/10/2018>>>>>> 03/10/2018   Subjective: Patient some nausea this morning and episodes of nausea and emesis overnight.  Patient able to tolerate some broth this morning.  No chest pain.  No shortness of breath.  Passing flatus.  Had bowel movement yesterday no bowel movement today.  Patient complained of worsening abdominal pain since this morning despite multiple as needed pain medications.   Objective: Vitals:   03/11/18 1544 03/11/18 2057 03/12/18 0452 03/12/18 0938  BP:  (!) 120/55 (!) 149/68   Pulse:  72 88 83  Resp:  18 18 18   Temp: 98 F (36.7 C) 97.9 F (36.6 C) 98 F (36.7 C)   TempSrc: Oral Oral Oral   SpO2:  93% 94% 95%  Weight:   83.1 kg (183 lb 3.2 oz)   Height:        Intake/Output Summary (Last 24 hours) at 03/12/2018 1209 Last data filed at 03/12/2018 1147 Gross per 24 hour  Intake 4383.09 ml  Output 1900 ml  Net 2483.09 ml   Filed Weights   03/10/18 0457 03/11/18 0443 03/12/18 0452  Weight: 81.5 kg (179 lb 9.6 oz) 82.1 kg (181 lb 1.6 oz) 83.1 kg (183 lb 3.2 oz)    Examination:  General  exam: NAD Respiratory system: Clear to auscultation bilaterally with no wheezes, no rhonchi, no crackles.  Normal respiratory effort.   Cardiovascular system: Regular rate and rhythm no murmurs rubs or gallops.  No JVD.  No lower extremity edema.  Gastrointestinal system: Abdomen is soft, nondistended, tenderness to palpation in the epigastrium and left upper quadrant.  No rebound.  Some guarding.  Decreased bowel sounds.  Central nervous system: Alert and oriented. No focal neurological deficits. Extremities: Symmetric 5 x 5 power. Skin: No rashes, lesions or ulcers Psychiatry: Judgement and insight appear normal. Mood & affect appropriate.     Data Reviewed: I have personally reviewed following labs and imaging  studies  CBC: Recent Labs  Lab 03/07/18 0515 03/08/18 0332 03/10/18 0503 03/11/18 0337 03/12/18 0509  WBC 7.1 7.0 16.2* 10.5 7.0  NEUTROABS  --   --   --  7.9*  --   HGB 10.0* 10.5* 13.0 10.4* 9.4*  HCT 31.7* 33.1* 40.4 32.4* 29.0*  MCV 87.1 88.0 86.7 86.4 87.6  PLT 144* 149* 235 183 967   Basic Metabolic Panel: Recent Labs  Lab 03/07/18 0515 03/08/18 0332 03/09/18 0456 03/10/18 0503 03/11/18 0337 03/12/18 0509  NA 140 138 139 138 139 141  K 3.4* 3.9 4.1 4.0 2.9* 2.8*  CL 109 108 110 107 109 110  CO2 24 24 20* 21* 22 23  GLUCOSE 98 103* 163* 119* 109* 95  BUN 11 13 16  24* 11 7  CREATININE 0.76 0.83 0.93 1.05* 0.75 0.61  CALCIUM 8.9 9.1 8.8* 8.8* 8.3* 8.1*  MG 1.4* 2.0  --   --   --  1.6*   GFR: Estimated Creatinine Clearance: 66.6 mL/min (by C-G formula based on SCr of 0.61 mg/dL). Liver Function Tests: Recent Labs  Lab 03/11/18 0337  AST 21  ALT 19  ALKPHOS 61  BILITOT 0.6  PROT 5.0*  ALBUMIN 2.6*   Recent Labs  Lab 03/11/18 0337  LIPASE 20   No results for input(s): AMMONIA in the last 168 hours. Coagulation Profile: No results for input(s): INR, PROTIME in the last 168 hours. Cardiac Enzymes: No results for input(s): CKTOTAL, CKMB, CKMBINDEX, TROPONINI in the last 168 hours. BNP (last 3 results) Recent Labs    08/17/17 1038 09/14/17 0921  PROBNP 612 503   HbA1C: No results for input(s): HGBA1C in the last 72 hours. CBG: No results for input(s): GLUCAP in the last 168 hours. Lipid Profile: No results for input(s): CHOL, HDL, LDLCALC, TRIG, CHOLHDL, LDLDIRECT in the last 72 hours. Thyroid Function Tests: No results for input(s): TSH, T4TOTAL, FREET4, T3FREE, THYROIDAB in the last 72 hours. Anemia Panel: Recent Labs    03/11/18 0937  VITAMINB12 258  FOLATE 21.3  FERRITIN 138  TIBC 206*  IRON 39   Sepsis Labs: No results for input(s): PROCALCITON, LATICACIDVEN in the last 168 hours.  Recent Results (from the past 240 hour(s))   Surgical pcr screen     Status: None   Collection Time: 03/04/18  9:04 PM  Result Value Ref Range Status   MRSA, PCR NEGATIVE NEGATIVE Final   Staphylococcus aureus NEGATIVE NEGATIVE Final    Comment: (NOTE) The Xpert SA Assay (FDA approved for NASAL specimens in patients 81 years of age and older), is one component of a comprehensive surveillance program. It is not intended to diagnose infection nor to guide or monitor treatment. Performed at [facility], [place] [address] [place] [address], [place], [place] [address]   Gram stain  Status: None   Collection Time: 03/10/18 10:15 AM  Result Value Ref Range Status   Specimen Description PERITONEAL  Final   Special Requests NONE  Final   Gram Stain   Final    RARE WBC PRESENT, PREDOMINANTLY MONONUCLEAR NO ORGANISMS SEEN Performed at [facility], [address], [place], [place] [address]    Report Status 03/10/2018 FINAL  Final  Culture, body fluid-bottle     Status: None (Preliminary result)   Collection Time: 03/10/18 10:15 AM  Result Value Ref Range Status   Specimen Description PERITONEAL  Final   Special Requests NONE  Final   Culture   Final    NO GROWTH 1 DAY Performed at [facility], [place] [address], [place], [place] [address]    Report Status PENDING  Incomplete         Radiology Studies: No results found.      Scheduled Meds: . allopurinol  100 mg Oral Daily  . cholecalciferol  1,000 Units Oral Daily  . citalopram  20 mg Oral TID  . clopidogrel  75 mg Oral Q breakfast  . feeding supplement  1 Container Oral TID BM  . feeding supplement (PRO-STAT SUGAR FREE 64)  30 mL Oral BID  . ferrous sulfate  325 mg Oral Q breakfast  . lidocaine  1 patch Transdermal Q24H  . multivitamin with minerals  1 tablet Oral Daily  . pantoprazole  80 mg Oral Daily  . pilocarpine  1 drop Both Eyes BID  . pramipexole  0.25 mg Oral QHS  . roflumilast  500 mcg Oral Daily  . scopolamine  1 patch Transdermal Q72H  . sodium  chloride flush  3 mL Intravenous Q12H  . sodium chloride flush  3 mL Intravenous Q12H  . traZODone  150 mg Oral QHS  . umeclidinium bromide  1 puff Inhalation Daily   Continuous Infusions: . sodium chloride    . sodium chloride 100 mL/hr at 03/12/18 0658  . sodium chloride    . heparin 1,200 Units/hr (03/11/18 2107)  . magnesium sulfate 1 - 4 g bolus IVPB    . potassium chloride 10 mEq (03/12/18 1147)     LOS: 11 days    Time spent: 35 minutes    Irine Seal, MD [facility] Pager [phone]  If 7PM-7AM, please contact night-coverage [email or web address] Password [facility] 03/12/2018, 12:09 PM

## 2018-03-12 NOTE — Progress Notes (Signed)
Subjective: The patient was seen and examined at bedside. She states that the abdominal pain is 7 out of 10 in intensity and constant without pain medications and decreases to 5 out of 10 in intensity after taking medications. She had a bowel movement yesterday which is described as dark in color, normal for patient as she is on iron. She continues to be nauseous but is willing to try full liquids and solids.  Objective: Vital signs in last 24 hours: Temp:  [97.9 F (36.6 C)-98 F (36.7 C)] 98 F (36.7 C) (03/22 0452) Pulse Rate:  [72-88] 83 (03/22 0938) Resp:  [18] 18 (03/22 0938) BP: (120-149)/(55-68) 149/68 (03/22 0452) SpO2:  [93 %-95 %] 95 % (03/22 0938) Weight:  [83.1 kg (183 lb 3.2 oz)] 83.1 kg (183 lb 3.2 oz) (03/22 0452) Weight change: 0.953 kg (2 lb 1.6 oz) Last BM Date: 03/11/18  PE:not in acute distress GENERAL:mild pallor, no icterus ABDOMEN:soft, nontender, nondistended, no shifting dullness, normoactive bowel sounds EXTREMITIES:no deformity  Lab Results: Results for orders placed or performed during the hospital encounter of 02/28/18 (from the past 48 hour(s))  Heparin level (unfractionated)     Status: None   Collection Time: 03/10/18  6:34 PM  Result Value Ref Range   Heparin Unfractionated 0.63 0.30 - 0.70 IU/mL    Comment:        IF HEPARIN RESULTS ARE BELOW EXPECTED VALUES, AND PATIENT DOSAGE HAS BEEN CONFIRMED, SUGGEST FOLLOW UP TESTING OF ANTITHROMBIN III LEVELS. Performed at [facility], [place] [address], [place], [place] [address]   Heparin level (unfractionated)     Status: None   Collection Time: 03/11/18  3:37 AM  Result Value Ref Range   Heparin Unfractionated 0.59 0.30 - 0.70 IU/mL    Comment:        IF HEPARIN RESULTS ARE BELOW EXPECTED VALUES, AND PATIENT DOSAGE HAS BEEN CONFIRMED, SUGGEST FOLLOW UP TESTING OF ANTITHROMBIN III LEVELS. Performed at [facility], [place] [address] [place], [place] [address]   Lipase, blood      Status: None   Collection Time: 03/11/18  3:37 AM  Result Value Ref Range   Lipase 20 11 - 51 U/L    Comment: Performed at [facility] [address], [place], [place] [address]  CBC with Differential/Platelet     Status: Abnormal   Collection Time: 03/11/18  3:37 AM  Result Value Ref Range   WBC 10.5 4.0 - 10.5 K/uL   RBC 3.75 (L) 3.87 - 5.11 MIL/uL   Hemoglobin 10.4 (L) 12.0 - 15.0 g/dL   HCT 32.4 (L) 36.0 - 46.0 %   MCV 86.4 78.0 - 100.0 fL   MCH 27.7 26.0 - 34.0 pg   MCHC 32.1 30.0 - 36.0 g/dL   RDW 16.3 (H) 11.5 - 15.5 %   Platelets 183 150 - 400 K/uL   Neutrophils Relative % 75 %   Neutro Abs 7.9 (H) 1.7 - 7.7 K/uL   Lymphocytes Relative 12 %   Lymphs Abs 1.3 0.7 - 4.0 K/uL   Monocytes Relative 12 %   Monocytes Absolute 1.2 (H) 0.1 - 1.0 K/uL   Eosinophils Relative 1 %   Eosinophils Absolute 0.1 0.0 - 0.7 K/uL   Basophils Relative 0 %   Basophils Absolute 0.0 0.0 - 0.1 K/uL    Comment: Performed at [facility], [address] [place], [place] [address]  Comprehensive metabolic panel     Status: Abnormal  Collection Time: 03/11/18  3:37 AM  Result Value Ref Range   Sodium 139 135 - 145 mmol/L   Potassium 2.9 (L) 3.5 - 5.1 mmol/L    Comment: DELTA CHECK NOTED   Chloride 109 101 - 111 mmol/L   CO2 22 22 - 32 mmol/L   Glucose, Bld 109 (H) 65 - 99 mg/dL   BUN 11 6 - 20 mg/dL   Creatinine, Ser 0.75 0.44 - 1.00 mg/dL   Calcium 8.3 (L) 8.9 - 10.3 mg/dL   Total Protein 5.0 (L) 6.5 - 8.1 g/dL   Albumin 2.6 (L) 3.5 - 5.0 g/dL   AST 21 15 - 41 U/L   ALT 19 14 - 54 U/L   Alkaline Phosphatase 61 38 - 126 U/L   Total Bilirubin 0.6 0.3 - 1.2 mg/dL   GFR calc non Af Amer >60 >60 mL/min   GFR calc Af Amer >60 >60 mL/min    Comment: (NOTE) The eGFR has been calculated using the CKD EPI equation. This calculation has not been validated in all clinical situations. eGFR's persistently <60 mL/min signify possible Chronic Kidney Disease.    Anion gap 8 5 - 15     Comment: Performed at [facility] [address], [place], [place] [address]  Vitamin B12     Status: None   Collection Time: 03/11/18  9:37 AM  Result Value Ref Range   Vitamin B-12 258 180 - 914 pg/mL    Comment: (NOTE) This assay is not validated for testing neonatal or myeloproliferative syndrome specimens for Vitamin B12 levels. Performed at [facility], [place] [address], [place], [place] [address]   Folate     Status: None   Collection Time: 03/11/18  9:37 AM  Result Value Ref Range   Folate 21.3 >5.9 ng/mL    Comment: Performed at [facility] [address], [place], [place] [address]  Iron and TIBC     Status: Abnormal   Collection Time: 03/11/18  9:37 AM  Result Value Ref Range   Iron 39 28 - 170 ug/dL   TIBC 206 (L) 250 - 450 ug/dL   Saturation Ratios 19 10.4 - 31.8 %   UIBC 167 ug/dL    Comment: Performed at [facility], [place] [address] [place], [place] [address]  Ferritin     Status: None   Collection Time: 03/11/18  9:37 AM  Result Value Ref Range   Ferritin 138 11 - 307 ng/mL    Comment: Performed at [facility], [place] [address], [place], [place] [address]  Basic metabolic panel     Status: Abnormal   Collection Time: 03/12/18  5:09 AM  Result Value Ref Range   Sodium 141 135 - 145 mmol/L   Potassium 2.8 (L) 3.5 - 5.1 mmol/L   Chloride 110 101 - 111 mmol/L   CO2 23 22 - 32 mmol/L   Glucose, Bld 95 65 - 99 mg/dL   BUN 7 6 - 20 mg/dL   Creatinine, Ser 0.61 0.44 - 1.00 mg/dL   Calcium 8.1 (L) 8.9 - 10.3 mg/dL   GFR calc non Af Amer >60 >60 mL/min   GFR calc Af Amer >60 >60 mL/min    Comment: (NOTE) The eGFR has been calculated using the CKD EPI equation. This calculation has not been validated in all clinical situations. eGFR's persistently <60 mL/min signify possible Chronic Kidney Disease.    Anion gap 8 5 - 15  Comment: Performed at [facility], [place] [address], [place], [place] [address]  Magnesium     Status:  Abnormal   Collection Time: 03/12/18  5:09 AM  Result Value Ref Range   Magnesium 1.6 (L) 1.7 - 2.4 mg/dL    Comment: Performed at [facility] [address], [place], [place] [address]  CBC     Status: Abnormal   Collection Time: 03/12/18  5:09 AM  Result Value Ref Range   WBC 7.0 4.0 - 10.5 K/uL   RBC 3.31 (L) 3.87 - 5.11 MIL/uL   Hemoglobin 9.4 (L) 12.0 - 15.0 g/dL   HCT 29.0 (L) 36.0 - 46.0 %   MCV 87.6 78.0 - 100.0 fL   MCH 28.4 26.0 - 34.0 pg   MCHC 32.4 30.0 - 36.0 g/dL   RDW 16.6 (H) 11.5 - 15.5 %   Platelets 167 150 - 400 K/uL    Comment: Performed at [facility], [place] [address] [place], [place] [address]  Heparin level (unfractionated)     Status: None   Collection Time: 03/12/18  5:12 AM  Result Value Ref Range   Heparin Unfractionated 0.41 0.30 - 0.70 IU/mL    Comment:        IF HEPARIN RESULTS ARE BELOW EXPECTED VALUES, AND PATIENT DOSAGE HAS BEEN CONFIRMED, SUGGEST FOLLOW UP TESTING OF ANTITHROMBIN III LEVELS. Performed at [facility], [place] [address] [place], [place] [address]     Studies/Results: No results found.  Medications: I have reviewed the patient's current medications.  Assessment: 1.Colitis on CAT scan and likely related to ischemia/reperfusion after SMA stent placement.  2.New-onset ascites, paracentesis on 03/10/2018 Ascites fluid albumin 1.3,serum albumin 2.6,  SAAG>1.1?portal HTN, CT angio abdomen showed questionable nodular liver?cirrhosis  Ascitic fluid total protein less than 3 WBC 57 with 59% macrophages/monocyte- no SBP  3.Hypokalemia, Hypomagnesemia  Plan: 1.Advance to full liquid diet and then to low-sodium diet as tolerated. Avoid normal saline or sodium containing product. 2. Antiemetics-on prochlorperazine 10 mg IV every 4 hours, scopolamine 1.5 mg transdermal patch every 72 hours. 3. Pain management-morphine 2 mg IV every 1 hour when necessary severe pain, oxycodone 5-10 mg every 4 hours when  necessary moderate pain. GI will follow.   Ronnette Juniper 03/12/2018, 2:25 PM   Pager [phone] If no answer or after 5 PM call [phone]

## 2018-03-12 NOTE — Progress Notes (Signed)
Physical Therapy Treatment Patient Details Name: [name] MRN: [id number] DOB: [birth year] Today's Date: 03/12/2018    History of Present Illness 77 year old female with past medical history of R THA, B TKA, B TSA, back surgery, neck surgery, CAD, PVD and COPD who is had worsening left hip issues that have gotten to the point where she does not ambulate much over the past month and presented to the emergency room on 3/10 with complaints of nausea, weakness and dyspnea on exertion and found to be in acute respiratory failure with hypoxia. Workup discovered multiplesubacute pulmonary embolus on CT scan. 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  During hospitalization patient is now s/p abdominal angiogram and parcentesis on 3/20.      PT Comments    Pt making steady progress. Continue to recommend home with HHPT.   Follow Up Recommendations  Home health PT     Equipment Recommendations  None recommended by PT    Recommendations for Other Services       Precautions / Restrictions Precautions Precautions: None Precaution Comments: pt denies h/o falls in past 1 year Restrictions Weight Bearing Restrictions: No    Mobility  Bed Mobility               General bed mobility comments: Pt sitting on EOB  Transfers Overall transfer level: Modified independent Equipment used: Rolling walker (2 wheeled) Transfers: Sit to/from Stand Sit to Stand: Modified independent (Device/Increase time)         General transfer comment: Stands unassisted from bed and from commode  Ambulation/Gait Ambulation/Gait assistance: Supervision Ambulation Distance (Feet): 140 Feet Assistive device: Rolling walker (2 wheeled) Gait Pattern/deviations: Step-through pattern;Trunk flexed;Decreased stride length Gait velocity: decr Gait velocity interpretation: Below normal speed for age/gender General Gait Details: Supervision for safety   Stairs             Wheelchair Mobility    Modified Rankin (Stroke Patients Only)       Balance Overall balance assessment: Needs assistance Sitting-balance support: No upper extremity supported;Feet supported Sitting balance-Leahy Scale: Good     Standing balance support: No upper extremity supported;During functional activity Standing balance-Leahy Scale: Fair                              Cognition Arousal/Alertness: Awake/alert Behavior During Therapy: WFL for tasks assessed/performed Overall Cognitive Status: Within Functional Limits for tasks assessed                                        Exercises      General Comments        Pertinent Vitals/Pain Pain Assessment: No/denies pain    Home Living                      Prior Function            PT Goals (current goals can now be found in the care plan section) Progress towards PT goals: Progressing toward goals    Frequency    Min 3X/week      PT Plan Current plan remains appropriate    Co-evaluation              AM-PAC PT "6 Clicks" Daily Activity  Outcome Measure  Difficulty turning over in bed (including adjusting bedclothes, sheets and  blankets)?: None Difficulty moving from lying on back to sitting on the side of the bed? : A Little Difficulty sitting down on and standing up from a chair with arms (e.g., wheelchair, bedside commode, etc,.)?: A Little Help needed moving to and from a bed to chair (including a wheelchair)?: A Little Help needed walking in hospital room?: A Little Help needed climbing 3-5 steps with a railing? : A Little 6 Click Score: 19    End of Session Equipment Utilized During Treatment: Gait belt Activity Tolerance: Patient tolerated treatment well Patient left: in bed;with call bell/phone within reach;with family/visitor present Nurse Communication: Mobility status PT Visit Diagnosis: Difficulty in walking, not elsewhere classified  (R26.2)     Time: 5638-9373 PT Time Calculation (min) (ACUTE ONLY): 15 min  Charges:  $Gait Training: 8-22 mins                    G Codes:       [facility] PT [place] 03/12/2018, 2:27 PM

## 2018-03-12 NOTE — Progress Notes (Signed)
Daughter concerned about patient. She stated that she is concerned about sepsis and she would like additional tests to be done since patient had UTI a few weeks ago?  Will pass it on to the day shift nurse, to follow up with MD.  Venetia Night, RN

## 2018-03-12 NOTE — Progress Notes (Signed)
Rapid response to bedside and DR. Thompson ordered Torodol IV and Abd xray.  Patient and her granddaughter at bedside updated on plan of care.

## 2018-03-12 NOTE — Progress Notes (Signed)
Ms Rance' potassium was 2.8 this morning.  1st run of 4 K+ boluses hung.  Magnesium bolus x 1 scheduled for this am also.  Patient updated on plan of care.

## 2018-03-12 NOTE — Progress Notes (Signed)
Dr. Grandville Silos in to see patient.  Transporter here to take for abdominal xray.  Family at bedside and updated on plan of care.

## 2018-03-12 NOTE — Progress Notes (Signed)
Patient c/o pain 8/10 in left lateral side.  Received Oxy 10mg  po at 1146 and Morphine 2mg  IV @ 1557.  Received 2nd dose Compazine 10mg  IV at 1705.  B/P = 156/77, P = 100, Resp = 20 and 02 sat = 94 % on room air.  Patient has been voiding today and last BM 03/19.  Patient alternating standing and sitting stating she is hurting "so bad and can't stand it."  Dr. Grandville Silos sent [facility] text page with above information.  Rapid Response called to request assissment.

## 2018-03-12 NOTE — Progress Notes (Addendum)
  Progress Note    03/12/2018 9:20 AM 4 Days Post-Op  Subjective:  Still some nausea but tolerated broth this morning   Vitals:   03/11/18 2057 03/12/18 0452  BP: (!) 120/55 (!) 149/68  Pulse: 72 88  Resp: 18 18  Temp: 97.9 F (36.6 C) 98 F (36.7 C)  SpO2: 93% 94%   Physical Exam: Lungs:  Non labored Incisions:  Unremarkable cath site Abdomen:  Soft, +BS Neurologic: A&O  CBC    Component Value Date/Time   WBC 7.0 03/12/2018 0509   RBC 3.31 (L) 03/12/2018 0509   HGB 9.4 (L) 03/12/2018 0509   HGB 12.9 08/17/2017 1038   HCT 29.0 (L) 03/12/2018 0509   HCT 38.2 08/17/2017 1038   PLT 167 03/12/2018 0509   PLT 237 08/17/2017 1038   MCV 87.6 03/12/2018 0509   MCV 88 08/17/2017 1038   MCH 28.4 03/12/2018 0509   MCHC 32.4 03/12/2018 0509   RDW 16.6 (H) 03/12/2018 0509   RDW 15.7 (H) 08/17/2017 1038   LYMPHSABS 1.3 03/11/2018 0337   MONOABS 1.2 (H) 03/11/2018 0337   EOSABS 0.1 03/11/2018 0337   BASOSABS 0.0 03/11/2018 0337    BMET    Component Value Date/Time   NA 141 03/12/2018 0509   NA 144 09/14/2017 0921   K 2.8 (L) 03/12/2018 0509   CL 110 03/12/2018 0509   CO2 23 03/12/2018 0509   GLUCOSE 95 03/12/2018 0509   BUN 7 03/12/2018 0509   BUN 12 09/14/2017 0921   CREATININE 0.61 03/12/2018 0509   CALCIUM 8.1 (L) 03/12/2018 0509   GFRNONAA >60 03/12/2018 0509   GFRAA >60 03/12/2018 0509    INR    Component Value Date/Time   INR 1.16 02/13/2017 1148     Intake/Output Summary (Last 24 hours) at 03/12/2018 0920 Last data filed at 03/12/2018 0836 Gross per 24 hour  Intake 3406.69 ml  Output 1900 ml  Net 1506.69 ml     Assessment/Plan:  77 y.o. female is s/p SMA stent 4 Days Post-Op   Persistent nausea; no further recommendations  Defer oral anticoagulation agent to primary team and GI Ok for discharge from vascular standpoint   Dagoberto Ligas, PA-C [facility] [phone] 03/12/2018 9:20 AM  May be a little better today.   No abdominal pain.  Had BM without blood. Continue to follow  Ruta Hinds, MD [facility] [place] Office: [phone] Pager: [phone]

## 2018-03-12 NOTE — Progress Notes (Signed)
Placed patient on [place] 2l/min for comfort. VS stable. Family at bedside.  Pain medication given,patient resting comfortably at this time.  Jazon Jipson, RN

## 2018-03-13 ENCOUNTER — Inpatient Hospital Stay (HOSPITAL_COMMUNITY): Payer: Medicare HMO

## 2018-03-13 DIAGNOSIS — E860 Dehydration: Secondary | ICD-10-CM

## 2018-03-13 DIAGNOSIS — R0603 Acute respiratory distress: Secondary | ICD-10-CM | POA: Diagnosis not present

## 2018-03-13 DIAGNOSIS — D649 Anemia, unspecified: Secondary | ICD-10-CM

## 2018-03-13 DIAGNOSIS — R112 Nausea with vomiting, unspecified: Secondary | ICD-10-CM

## 2018-03-13 DIAGNOSIS — R0602 Shortness of breath: Secondary | ICD-10-CM

## 2018-03-13 LAB — URINALYSIS, MICROSCOPIC (REFLEX)

## 2018-03-13 LAB — URINALYSIS, ROUTINE W REFLEX MICROSCOPIC
Glucose, UA: 100 mg/dL — AB
Ketones, ur: NEGATIVE mg/dL
Leukocytes, UA: NEGATIVE
Nitrite: NEGATIVE
Protein, ur: >300 mg/dL — AB
Specific Gravity, Urine: >1.03 — ABNORMAL HIGH (ref 1.005–1.030)
pH: 5 (ref 5.0–8.0)

## 2018-03-13 LAB — CBC WITH DIFFERENTIAL/PLATELET
Band Neutrophils: 0 %
Band Neutrophils: 0 %
Basophils Absolute: 0 10*3/uL (ref 0.0–0.1)
Basophils Absolute: 0 10*3/uL (ref 0.0–0.1)
Basophils Relative: 0 %
Basophils Relative: 0 %
Blasts: 0 %
Blasts: 0 %
Eosinophils Absolute: 0 10*3/uL (ref 0.0–0.7)
Eosinophils Absolute: 0 10*3/uL (ref 0.0–0.7)
Eosinophils Relative: 0 %
Eosinophils Relative: 0 %
HCT: 22.7 % — ABNORMAL LOW (ref 36.0–46.0)
HCT: 25.8 % — ABNORMAL LOW (ref 36.0–46.0)
Hemoglobin: 7.4 g/dL — ABNORMAL LOW (ref 12.0–15.0)
Hemoglobin: 7.9 g/dL — ABNORMAL LOW (ref 12.0–15.0)
Lymphocytes Relative: 7 %
Lymphocytes Relative: 7 %
Lymphs Abs: 1.4 10*3/uL (ref 0.7–4.0)
Lymphs Abs: 1.5 10*3/uL (ref 0.7–4.0)
MCH: 28.9 pg (ref 26.0–34.0)
MCH: 26.7 pg (ref 26.0–34.0)
MCHC: 32.6 g/dL (ref 30.0–36.0)
MCHC: 30.6 g/dL (ref 30.0–36.0)
MCV: 88.7 fL (ref 78.0–100.0)
MCV: 87.2 fL (ref 78.0–100.0)
Metamyelocytes Relative: 0 %
Metamyelocytes Relative: 0 %
Monocytes Absolute: 1 10*3/uL (ref 0.1–1.0)
Monocytes Absolute: 0.9 10*3/uL (ref 0.1–1.0)
Monocytes Relative: 5 %
Monocytes Relative: 4 %
Myelocytes: 0 %
Myelocytes: 0 %
Neutro Abs: 17.4 10*3/uL — ABNORMAL HIGH (ref 1.7–7.7)
Neutro Abs: 19.1 10*3/uL — ABNORMAL HIGH (ref 1.7–7.7)
Neutrophils Relative %: 88 %
Neutrophils Relative %: 89 %
Other: 0 %
Other: 0 %
Platelets: 279 10*3/uL (ref 150–400)
Platelets: 227 10*3/uL (ref 150–400)
Promyelocytes Absolute: 0 %
Promyelocytes Absolute: 0 %
RBC: 2.56 MIL/uL — ABNORMAL LOW (ref 3.87–5.11)
RBC: 2.96 MIL/uL — ABNORMAL LOW (ref 3.87–5.11)
RDW: 17.3 % — ABNORMAL HIGH (ref 11.5–15.5)
RDW: 17.5 % — ABNORMAL HIGH (ref 11.5–15.5)
WBC: 19.8 10*3/uL — ABNORMAL HIGH (ref 4.0–10.5)
WBC: 21.5 10*3/uL — ABNORMAL HIGH (ref 4.0–10.5)
nRBC: 0 /100 WBC
nRBC: 0 /100 WBC

## 2018-03-13 LAB — CBC
HCT: 26.5 % — ABNORMAL LOW (ref 36.0–46.0)
Hemoglobin: 8.6 g/dL — ABNORMAL LOW (ref 12.0–15.0)
MCH: 28.1 pg (ref 26.0–34.0)
MCHC: 32.5 g/dL (ref 30.0–36.0)
MCV: 86.6 fL (ref 78.0–100.0)
Platelets: 215 10*3/uL (ref 150–400)
RBC: 3.06 MIL/uL — ABNORMAL LOW (ref 3.87–5.11)
RDW: 16.3 % — ABNORMAL HIGH (ref 11.5–15.5)
WBC: 10.4 10*3/uL (ref 4.0–10.5)

## 2018-03-13 LAB — BASIC METABOLIC PANEL
Anion gap: 7 (ref 5–15)
Anion gap: 10 (ref 5–15)
BUN: 6 mg/dL (ref 6–20)
BUN: 11 mg/dL (ref 6–20)
CO2: 20 mmol/L — ABNORMAL LOW (ref 22–32)
CO2: 17 mmol/L — ABNORMAL LOW (ref 22–32)
Calcium: 7.7 mg/dL — ABNORMAL LOW (ref 8.9–10.3)
Calcium: 7.8 mg/dL — ABNORMAL LOW (ref 8.9–10.3)
Chloride: 109 mmol/L (ref 101–111)
Chloride: 107 mmol/L (ref 101–111)
Creatinine, Ser: 0.68 mg/dL (ref 0.44–1.00)
Creatinine, Ser: 1.37 mg/dL — ABNORMAL HIGH (ref 0.44–1.00)
GFR calc Af Amer: >60 mL/min (ref >60)
GFR calc Af Amer: 42 mL/min — ABNORMAL LOW (ref >60)
GFR calc non Af Amer: >60 mL/min (ref >60)
GFR calc non Af Amer: 36 mL/min — ABNORMAL LOW (ref >60)
Glucose, Bld: 119 mg/dL — ABNORMAL HIGH (ref 65–99)
Glucose, Bld: 172 mg/dL — ABNORMAL HIGH (ref 65–99)
Potassium: 3.6 mmol/L (ref 3.5–5.1)
Potassium: 4.2 mmol/L (ref 3.5–5.1)
Sodium: 136 mmol/L (ref 135–145)
Sodium: 134 mmol/L — ABNORMAL LOW (ref 135–145)

## 2018-03-13 LAB — HEPARIN LEVEL (UNFRACTIONATED): Heparin Unfractionated: 0.32 IU/mL (ref 0.30–0.70)

## 2018-03-13 LAB — PREPARE RBC (CROSSMATCH)

## 2018-03-13 LAB — MAGNESIUM: Magnesium: 2.1 mg/dL (ref 1.7–2.4)

## 2018-03-13 LAB — BRAIN NATRIURETIC PEPTIDE: B Natriuretic Peptide: 220.9 pg/mL — ABNORMAL HIGH (ref 0.0–100.0)

## 2018-03-13 LAB — BLOOD GAS, ARTERIAL
Acid-base deficit: 6.8 mmol/L — ABNORMAL HIGH (ref 0.0–2.0)
Bicarbonate: 17.8 mmol/L — ABNORMAL LOW (ref 20.0–28.0)
Drawn by: 244851
O2 Content: 4 L/min
O2 Saturation: 96.4 %
Patient temperature: 98.2
pCO2 arterial: 32.5 mmHg (ref 32.0–48.0)
pH, Arterial: 7.355 (ref 7.350–7.450)
pO2, Arterial: 88.7 mmHg (ref 83.0–108.0)

## 2018-03-13 LAB — TROPONIN I
Troponin I: <0.03 ng/mL (ref <0.03)
Troponin I: <0.03 ng/mL (ref <0.03)

## 2018-03-13 LAB — OCCULT BLOOD X 1 CARD TO LAB, STOOL: Fecal Occult Bld: POSITIVE — AB

## 2018-03-13 MED ORDER — POTASSIUM CHLORIDE 10 MEQ/100ML IV SOLN
10.0000 meq | INTRAVENOUS | Status: AC
  Administered 2018-03-13 (×4): 10 meq via INTRAVENOUS
  Filled 2018-03-13 (×3): qty 100

## 2018-03-13 MED ORDER — SODIUM CHLORIDE 0.9 % IV SOLN: Freq: Once | INTRAVENOUS | Status: DC

## 2018-03-13 MED ORDER — FUROSEMIDE 10 MG/ML IJ SOLN
20.0000 mg | Freq: Once | INTRAMUSCULAR | Status: DC
  Filled 2018-03-13: qty 2

## 2018-03-13 MED ORDER — ACETAMINOPHEN 325 MG PO TABS
650.0000 mg | ORAL_TABLET | Freq: Once | ORAL | Status: AC
  Administered 2018-03-13: 650 mg via ORAL
  Filled 2018-03-13: qty 2

## 2018-03-13 MED ORDER — METRONIDAZOLE IN NACL 5-0.79 MG/ML-% IV SOLN
500.0000 mg | Freq: Three times a day (TID) | INTRAVENOUS | Status: DC
  Administered 2018-03-13 – 2018-03-16 (×8): 500 mg via INTRAVENOUS
  Filled 2018-03-13 (×9): qty 100

## 2018-03-13 MED ORDER — DIPHENHYDRAMINE HCL 25 MG PO CAPS
25.0000 mg | ORAL_CAPSULE | Freq: Once | ORAL | Status: AC
  Administered 2018-03-13: 25 mg via ORAL
  Filled 2018-03-13: qty 1

## 2018-03-13 MED ORDER — FUROSEMIDE 10 MG/ML IJ SOLN
60.0000 mg | Freq: Once | INTRAMUSCULAR | Status: AC
  Administered 2018-03-13: 60 mg via INTRAVENOUS
  Filled 2018-03-13: qty 6

## 2018-03-13 MED ORDER — POTASSIUM CHLORIDE CRYS ER 20 MEQ PO TBCR: 40.0000 meq | EXTENDED_RELEASE_TABLET | Freq: Once | ORAL | Status: DC

## 2018-03-13 MED ORDER — CIPROFLOXACIN IN D5W 400 MG/200ML IV SOLN
400.0000 mg | Freq: Two times a day (BID) | INTRAVENOUS | Status: DC
Start: 2018-03-13 — End: 2018-03-14
  Administered 2018-03-13: 400 mg via INTRAVENOUS
  Filled 2018-03-13 (×3): qty 200

## 2018-03-13 NOTE — Progress Notes (Signed)
Soap suds enema given with small results.

## 2018-03-13 NOTE — Progress Notes (Signed)
OT Cancellation Note  Patient Details Name: [name] MRN: [id number] DOB: 1941-07-30   Cancelled Treatment:    Reason Eval/Treat Not Completed: Medical issues which prohibited therapy. While walking back to bed from bathroom with NT, Pt desaturated to 60% and rapid response called.  Dr. Grandville Silos gave verbal order for purewick or foley catheter, don't have patient get out of bed at this time.    [name] 03/13/2018, 2:24 PM  Hulda Humphrey OTR/L [phone]

## 2018-03-13 NOTE — Progress Notes (Signed)
[place] for Heparin Indication: DVT, PE  Allergies  Allergen Reactions  . Zolpidem Tartrate Anxiety    CONFUSION   . Pitavastatin Other (See Comments)    Not sure  . Ropinirole Nausea Only    Makes legs jump  . Penicillins Rash   Patient Measurements: Height: 5\' 8"  (172.7 cm) Weight: 177 lb 12.8 oz (80.6 kg)(scale a) IBW/kg (Calculated) : 63.9 Heparin Dosing Weight: 77.8 kg  Vital Signs: Temp: 98.1 F (36.7 C) (03/23 0436) Temp Source: Oral (03/23 0436) BP: 103/55 (03/23 0436) Pulse Rate: 100 (03/23 0436)  Labs: Recent Labs    03/11/18 0337 03/12/18 0509 03/12/18 0512 03/12/18 1449 03/13/18 0332  HGB 10.4* 9.4*  --   --  8.6*  HCT 32.4* 29.0*  --   --  26.5*  PLT 183 167  --   --  215  HEPARINUNFRC 0.59  --  0.41  --  0.32  CREATININE 0.75 0.61  --  0.60 0.68    Assessment: 68 yoF presents with acute vs. chronic PE with R heart strain, also with LLE DVT.  Continues on heparin while Xarelto on hold.  S/p SMA stent with ongoing nausea.   Heparin level is therapeutic at 0.32, on 1200 units/hr this morning. Hgb is down slightly to 8.6, plts WNL. No signs/symptoms of bleeding per nursing. No infusion issues.   Goal of Therapy:  Heparin level 0.3-0.7 units/ml Monitor platelets by anticoagulation protocol: Yes   Plan:  Increase heparin to 1250 units / hr to keep in goal range Daily heparin level, CBC Monitor for signs/symptoms of bleeding  Thank you Doylene Canard, PharmD Clinical Pharmacist  Pager: [phone] Clinical Phone for 03/13/2018 until 3:30pm: [phone] If after 3:30pm, please call main pharmacy at [phone]

## 2018-03-13 NOTE — Progress Notes (Signed)
Pt having shortness of breath, pt saturation 60% on [place] 2L  Placed pt on NRB pt saturation 97% Pt alert and following commands Called Rapid response team and paged MD  Awaiting call back from MD

## 2018-03-13 NOTE — Progress Notes (Signed)
[facility] Progress Note  [name] 77 y.o. 10/07/1941  CC:  Nausea, vomiting, abdominal pain, colitis   Subjective: last episode of vomiting yesterday morning. Denied further vomiting this morning. Minimal improvement in abdominal pain noted.denied any blood in the stool.  ROS : Negative for chest pain. Positive for weakness and fatigue.   Objective: Vital signs in last 24 hours: Vitals:   03/13/18 0436 03/13/18 0911  BP: (!) 103/55   Pulse: 100   Resp: 20   Temp: 98.1 F (36.7 C)   SpO2: 98% 91%    Physical Exam:  General. Alert and oriented 3. Not in acute distress Abdomen. Soft, mild bilateral lower quadrant discomfort on palpation, nondistended, bowel sounds present. No peritoneal signs Lower extremity. No significant edema Psych -  Mood and affect normal  Lab Results: Recent Labs    03/12/18 0509 03/12/18 1449 03/13/18 0332  NA 141 138 136  K 2.8* 3.1* 3.6  CL 110 110 109  CO2 23 21* 20*  GLUCOSE 95 106* 119*  BUN 7 6 6   CREATININE 0.61 0.60 0.68  CALCIUM 8.1* 8.0* 7.7*  MG 1.6*  --  2.1   Recent Labs    03/11/18 0337  AST 21  ALT 19  ALKPHOS 61  BILITOT 0.6  PROT 5.0*  ALBUMIN 2.6*   Recent Labs    03/11/18 0337 03/12/18 0509 03/13/18 0332  WBC 10.5 7.0 10.4  NEUTROABS 7.9*  --   --   HGB 10.4* 9.4* 8.6*  HCT 32.4* 29.0* 26.5*  MCV 86.4 87.6 86.6  PLT 183 167 215   No results for input(s): LABPROT, INR in the last 72 hours.    Assessment/Plan: - right-sided colitis. Most likely ischemia/reperfusion injury after placement of SMA stent. - New-onset ascites. Paracentesis on 03/10/2018 showed SAAG >1.1 - CT scan showing nodular liver concerning for cirrhosis. - Anemia. No evidence of overt bleeding  Recommendations ------------------------ - Continue conservative / symptomaticmanagement for now. - Offered to advance the diet but patient declined. - outpatient workup of possible cirrhosis once acute issues are  resolved. - GI will follow.   Otis Brace MD, [place] 03/13/2018, 11:27 AM  Contact #  [phone]

## 2018-03-13 NOTE — Significant Event (Addendum)
Rapid Response Event Note  Called per floor RN regarding Pt with acute SOB while ambulating to bathroom. Per floor RN Po2 sats 60% on 2 LNC. Advised RN to placed Pt on NRB while RRT en route. Per floor RN Pt placed on 2 LNC this AM for progressive SOB.  Overview: Time Called: 1226 Arrival Time: 1230 Event Type: Respiratory  Initial Focused Assessment: Upon my arrival Pt found resting in bed, appears weak but not in distress at that time. Follows commands, complains of constant pain in ABD. Lungs sounds clear diminished throughout. ABD soft and tender. Heart tones WNL s1s2. BP 104/45 HR 90s NSR, Po2 initially 100% on NRB on my arrival. NEB tx given PRN per floor RN. Pt weaned to [place] 4 LNC 02 sats 96-100%, RR 17.    Interventions: Dr. Grandville Silos paged to bedside. EKG, ABG ordered. EKG completed yielding ST w/ PACs reviewed per MD at bedside. ABG en process at this time per RT. CXR completed STAT and reviewed per MD. Lasix ordered and given, Pure wick urine collector placed.   Plan of Care (if not transferred): RN advised to monitor Pt closely, update my self and MD for worsening changes. RN to update GI on Pt continued ABD pain and follow up on pending labs. RRT will follow.  Event Summary: Name of Physician Notified: Dr. Grandville Silos at 1226    at    Outcome: Stayed in room and stabalized  Event End Time: [address] [place]

## 2018-03-13 NOTE — Progress Notes (Signed)
NT called RN into room, saying patient having trouble breathing and collapsed into bed after walking back from bathroom.  Patient's oxygen saturation 60% on room air, patient alert and oriented x 4.  Dr. Grandville Silos and rapid response paged.  Non-rebreather placed and then patient given prn nebulizer.  Lung sounds clear/diminished bilaterally.  Dr. Grandville Silos arrived to bedside. New orders received.  Dr. Grandville Silos gave verbal order for purewick or foley catheter, don't have patient get out of bed at this time.  Dr. Grandville Silos also made aware of patient's hemoglobin from this morning.  After breathing treatment, oxygen saturation 96% on 4L.  Patient states breathing is better.  Will continue to monitor patient.

## 2018-03-13 NOTE — Progress Notes (Signed)
Patient to be transfused. Per Dr. Grandville Silos

## 2018-03-13 NOTE — Progress Notes (Signed)
Pt's Hgb is 7.9. Jeannette Corpus on call for tonight notified and ordered to hold blood transfusion and re check CBC at 0100 AM.

## 2018-03-13 NOTE — Progress Notes (Signed)
Received phone call from Dr. Grandville Silos, to hold blood transfusion until stat CBC drawn again, and then will decide whether patient will get blood transfusion or not.  Will monitor for results and update MD.

## 2018-03-13 NOTE — Progress Notes (Signed)
PROGRESS NOTE    [name]  WUJ:[id number] DOB: 06-Jul-1941 DOA: 02/28/2018 PCP: [name], MD    Brief Narrative:  [name] is a 77 year old female with past medical history of CAD, PVD and COPD, hx small bowel AVM and had been off anticoagulation since Feb 2018, had worsening left hip issues that have gotten to the point where she has not ambulated much over the past month and now presented to the emergency room on 3/10 with complaints of nausea, weakness and dyspnea on exertion and found to be in acute respiratory failure with hypoxia. Workup discovered multiple subacute pulmonary embolus on CT scanas well as a large acute DVT in her left femoral vein. 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 Patient started on heparin gtt and admitted to the hospitalist service. She was evaluated by vascular surgery in regards to her questionable mesenteric artery stenosis.  She underwent mesenteric angiogram on 3/15, however was a unable to stent SMA. Re-attempted on 3/18 and had stent placed. Since stent placement, she has had worsening abdominal pain, nausea, vomiting. Repeat CTA abd/pelvis revealed patent SMA stent with moderate abdominopelvic ascites, colonic wall thickening. GI consulted.      Assessment & Plan:   Principal Problem:   Acute respiratory distress Active Problems:   Colitis   Abnormal CT of the abdomen   Hypothyroidism   Essential hypertension   COPD (chronic obstructive pulmonary disease) (HCC)   CAD (coronary artery disease)   Pulmonary embolism (HCC)   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HCC)   Acute on chronic respiratory failure with hypoxia (HCC)   AKI (acute kidney injury) (HCC)   Ascites  1 acute respiratory distress with hypoxia RN patient with shortness of breath after coming out of the bathroom.  Patient on nonrebreather.  Patient was noted to be desat 60% on 2 L nasal cannula.  Questionable etiology.  Concern for volume  overload due to hydration with IV fluids.  Patient is approximately +10.5 L during this hospitalization.  Daily weights seems unreliable.  Check a stat chest x-ray, ABG, EKG, cycle cardiac enzymes every 6 hours x3, repeat CBC, repeat BMET, Bnp.  Place Foley catheter.  Lasix 60 mg IV x1 now.  Strict I's and O's.  Daily weights.  #2 PE and acute left femoral DVT Patient with prior history of DVT postop 15 years ago was on anticoagulation with Coumadin at that time however this was discontinued in February 2018 secondary to GI bleed/AVM.  Patient with poor ambulatory status due to chronic hip pain.  2D echo done negative for right heart strain.  2D echo with normal EF.  Patient currently on IV heparin drip.  The IV drip for now until patient is improved and tolerating a solid diet then will transition to Xarelto.  Patient with no overt bleeding on IV heparin.  She is noted to have black stools per RN and as such FOBT was ordered however not done. Patient currently with nausea and emesis and as such we will hold on transitioning to an oral NOAC.  3.  Superior mesenteric artery stenosis Status post angiogram March 05, 2018 however unable to have stents placed at that time.  Angiogram was reattempted 03/08/2018 with successful stent placement in the SMA.  Repeat CT angiogram abdomen and pelvis with patent stents.  Has been seen by vascular surgery who are currently recommending outpatient follow-up.  Diffuse abdominal pain.  Abdominal films negative.  Per vascular surgery.  4.  Moderate  abdominal pelvic ascites/colonic wall thickening Patient noted to have upper abdominal pain and noted to have worsening symptoms since stent placement of SMA.  Patient with nausea multiple episodes of emesis 03/11/2018.  Patient complains of nausea and emesis yesterday 03/12/2018.  Patient with ongoing abdominal pain.  Patient's abdomen is soft with tenderness to palpation diffusely.  No rebound.  No guarding.  CT angiogram  abdomen and pelvis was completed with some abnormal results noted of colonic wall thickening involving the cecum, ascending and proximal transverse colon.  Also noted was nodular hepatic contours.  Colonic diverticulosis.  GI was consulted and is currently following.  Patient noted to have a leukocytosis is trending back down and currently at 10,400, 7000 from 10,500 from 16,000 (03/10/2018).  Patient initially started on IV ciprofloxacin and IV Flagyl empirically as patient with some nausea and emesis and a leukocytosis on 03/10/2018.  Per GI, likely ischemic injury to proximal colon during stenting and transudative ascites in reaction to that injury recommending symptomatic care at this time given absence of fever.  IV antibiotics were discontinued after 1 dose on 03/10/2018.  Keep on clear liquids.  Due to hypoxia and shortness of breath.  Abdominal films unremarkable. Monitor off antibiotics per GI.  GI following.   5.  Hypertension Blood pressure stable.    6.  COPD Stable.  Continue Ellipta, Daliresp.  Follow.    7.  History of small bowel AVM Diagnosed in 2018.  Patient with no acute symptoms of bleeding and doing okay on heparin drip.  Monitor closely.  8.  Chronic left hip pain secondary to degenerative joint disease Symptomatic treatment.  Continue current pain control.  Outpatient follow-up with orthopedic surgery.  9.  Hypokalemia/hypomagnesemia Secondary to GI losses.  Magnesium 2.1 today.  Potassium of 3.6.  Replete.  10. Anemia Likely dilutional component.  Patient denies any overt bleeding.  Anemia panel consistent with anemia of chronic disease.  FOBT pending.  Patient will be placed on IV diuretics.  Follow H&H.  GI following.     DVT prophylaxis: Heparin drip. Code Status: Full Family Communication: Updated patient.  No family at bedside.  Disposition Plan: Likely home with home health once patient has improved clinically with no further abdominal pain and tolerating a solid  diet and per GI and vascular surgery.   Consultants:   Gastroenterology: Dr.Brahmbhatt 03/09/2018  Vascular surgery: Dr. [name] 03/01/2018  Procedures:   CT abdomen and pelvis 03/01/2018  CT angiogram chest 03/01/2018  CT angiogram abdomen and pelvis with and without contrast 03/09/2018  Abdominal films 03/08/2018  2D echo 03/02/2018  Lower extremity Dopplers 03/01/2018  Ultrasound-guided right lateral abdominal paracentesis with 100 cc of clear red fluid removed per Dr. Vernard Gambles 03/10/2018  Abdominal aortogram selective superior mesenteric angiogram attempted superior mesenteric artery stent placement per Dr. [name] 03/05/2018  Ultrasound-guided cannulation of right common femoral artery, stent of SMA with 6 x 39 mm VBX, percutaneous closure of right common femoral artery with mynx per Dr. Donzetta Matters 03/08/2018  Abdominal films 03/12/2018   Chest x-ray 03/13/2018 pending  Antimicrobials:   IV ciprofloxacin 03/10/2018>>>> 03/10/2018  IV Flagyl 03/10/2018>>>>>> 03/10/2018   Subjective: Patient states not feeling too well.  Per RN patient came out of the bathroom and noted to be hypoxic with sats of 60% on nasal cannula on 2 L.  Patient was following commands.  Rapid response was called.  Came and assessed patient patient on nonrebreather states does not feel well.  Patient with complaints of nausea.  Bout of emesis overnight.  Patient with some shortness of breath.  No chest pain.  No overt bleeding.  Objective: Vitals:   03/13/18 0911 03/13/18 1041 03/13/18 1225 03/13/18 1228  BP:    110/89  Pulse:    (!) 138  Resp:      Temp:  98.2 F (36.8 C)    TempSrc:  Oral    SpO2: 91% 96% (!) 60% 97%  Weight:      Height:        Intake/Output Summary (Last 24 hours) at 03/13/2018 1247 Last data filed at 03/13/2018 0441 Gross per 24 hour  Intake 2698.37 ml  Output 1450 ml  Net 1248.37 ml   Filed Weights   03/11/18 0443 03/12/18 0452 03/13/18 0436  Weight: 82.1 kg (181 lb 1.6 oz) 83.1 kg  (183 lb 3.2 oz) 80.6 kg (177 lb 12.8 oz)    Examination:  General exam: On NRB Respiratory system: Diffuse crackles.  No rhonchi.  No wheezing.  Some use of oxygen muscles of respiration.   Cardiovascular system: Regular rate and rhythm no murmurs rubs or gallops.  No JVD.  No lower extremity edema.  Gastrointestinal system: Abdomen is soft, diffusely tender to palpation, nondistended, positive bowel sounds.  No rebound.  No guarding.  Central nervous system: Drowsy.  Oriented.  Moving extremities spontaneously. Extremities: Symmetric 5 x 5 power. Skin: No rashes, lesions or ulcers Psychiatry: Judgement and insight appear normal. Mood & affect appropriate.     Data Reviewed: I have personally reviewed following labs and imaging studies  CBC: Recent Labs  Lab 03/08/18 0332 03/10/18 0503 03/11/18 0337 03/12/18 0509 03/13/18 0332  WBC 7.0 16.2* 10.5 7.0 10.4  NEUTROABS  --   --  7.9*  --   --   HGB 10.5* 13.0 10.4* 9.4* 8.6*  HCT 33.1* 40.4 32.4* 29.0* 26.5*  MCV 88.0 86.7 86.4 87.6 86.6  PLT 149* 235 183 167 573   Basic Metabolic Panel: Recent Labs  Lab 03/07/18 0515 03/08/18 0332  03/10/18 0503 03/11/18 0337 03/12/18 0509 03/12/18 1449 03/13/18 0332  NA 140 138   < > 138 139 141 138 136  K 3.4* 3.9   < > 4.0 2.9* 2.8* 3.1* 3.6  CL 109 108   < > 107 109 110 110 109  CO2 24 24   < > 21* 22 23 21* 20*  GLUCOSE 98 103*   < > 119* 109* 95 106* 119*  BUN 11 13   < > 24* 11 7 6 6   CREATININE 0.76 0.83   < > 1.05* 0.75 0.61 0.60 0.68  CALCIUM 8.9 9.1   < > 8.8* 8.3* 8.1* 8.0* 7.7*  MG 1.4* 2.0  --   --   --  1.6*  --  2.1   < > = values in this interval not displayed.   GFR: Estimated Creatinine Clearance: 65.6 mL/min (by C-G formula based on SCr of 0.68 mg/dL). Liver Function Tests: Recent Labs  Lab 03/11/18 0337  AST 21  ALT 19  ALKPHOS 61  BILITOT 0.6  PROT 5.0*  ALBUMIN 2.6*   Recent Labs  Lab 03/11/18 0337  LIPASE 20   No results for input(s):  AMMONIA in the last 168 hours. Coagulation Profile: No results for input(s): INR, PROTIME in the last 168 hours. Cardiac Enzymes: No results for input(s): CKTOTAL, CKMB, CKMBINDEX, TROPONINI in the last 168 hours. BNP (last 3 results) Recent Labs    08/17/17 1038 09/14/17 2202  PROBNP 612 503   HbA1C: No results for input(s): HGBA1C in the last 72 hours. CBG: No results for input(s): GLUCAP in the last 168 hours. Lipid Profile: No results for input(s): CHOL, HDL, LDLCALC, TRIG, CHOLHDL, LDLDIRECT in the last 72 hours. Thyroid Function Tests: No results for input(s): TSH, T4TOTAL, FREET4, T3FREE, THYROIDAB in the last 72 hours. Anemia Panel: Recent Labs    03/11/18 0937  VITAMINB12 258  FOLATE 21.3  FERRITIN 138  TIBC 206*  IRON 39   Sepsis Labs: No results for input(s): PROCALCITON, LATICACIDVEN in the last 168 hours.  Recent Results (from the past 240 hour(s))  Surgical pcr screen     Status: None   Collection Time: 03/04/18  9:04 PM  Result Value Ref Range Status   MRSA, PCR NEGATIVE NEGATIVE Final   Staphylococcus aureus NEGATIVE NEGATIVE Final    Comment: (NOTE) The Xpert SA Assay (FDA approved for NASAL specimens in patients 21 years of age and older), is one component of a comprehensive surveillance program. It is not intended to diagnose infection nor to guide or monitor treatment. Performed at [facility], [place] [address], [place], [place] [address]   Gram stain     Status: None   Collection Time: 03/10/18 10:15 AM  Result Value Ref Range Status   Specimen Description PERITONEAL  Final   Special Requests NONE  Final   Gram Stain   Final    RARE WBC PRESENT, PREDOMINANTLY MONONUCLEAR NO ORGANISMS SEEN Performed at [facility], [address], [place], [place] [address]    Report Status 03/10/2018 FINAL  Final  Culture, body fluid-bottle     Status: None (Preliminary result)   Collection Time: 03/10/18 10:15 AM  Result Value Ref Range  Status   Specimen Description PERITONEAL  Final   Special Requests NONE  Final   Culture   Final    NO GROWTH 2 DAYS Performed at [facility], [place] [address], [place], [place] [address]    Report Status PENDING  Incomplete         Radiology Studies: Dg Abd 2 Views  Result Date: 03/12/2018 CLINICAL DATA:  Lower abdominal pain beginning this morning post vascular surgery EXAM: ABDOMEN - 2 VIEW COMPARISON:  03/08/2018 FINDINGS: Normal bowel gas pattern. No bowel dilatation or bowel wall thickening. Gas present in rectum. Bones demineralized with evidence of prior lumbar fusion and prior RIGHT hip replacement. Scattered degenerative changes of lower thoracic spine. Surgical clips RIGHT upper quadrant question cholecystectomy. Degenerative changes LEFT hip joint. Question vascular calcification versus 8 mm diameter calculus at upper pole LEFT kidney. IMPRESSION: No acute abnormalities. Electronically Signed   By: Lavonia Dana M.D.   On: 03/12/2018 19:28        Scheduled Meds: . allopurinol  100 mg Oral Daily  . cholecalciferol  1,000 Units Oral Daily  . citalopram  20 mg Oral TID  . clopidogrel  75 mg Oral Q breakfast  . feeding supplement  1 Container Oral TID BM  . feeding supplement (PRO-STAT SUGAR FREE 64)  30 mL Oral BID  . ferrous sulfate  325 mg Oral Q breakfast  . furosemide  60 mg Intravenous Once  . lidocaine  1 patch Transdermal Q24H  . multivitamin with minerals  1 tablet Oral Daily  . pantoprazole  40 mg Oral Daily  . pilocarpine  1 drop Both Eyes BID  . pramipexole  0.25 mg Oral QHS  . roflumilast  500  mcg Oral Daily  . scopolamine  1 patch Transdermal Q72H  . sodium chloride flush  3 mL Intravenous Q12H  . sodium chloride flush  3 mL Intravenous Q12H  . traZODone  150 mg Oral QHS  . umeclidinium bromide  1 puff Inhalation Daily   Continuous Infusions: . sodium chloride    . sodium chloride    . heparin 1,250 Units/hr (03/13/18 1104)     LOS: 12 days      Time spent: 40 minutes    Irine Seal, MD [facility] Pager [phone]  If 7PM-7AM, please contact night-coverage [email or web address] Password [facility] 03/13/2018, 12:47 PM

## 2018-03-14 ENCOUNTER — Inpatient Hospital Stay (HOSPITAL_COMMUNITY): Payer: Medicare HMO

## 2018-03-14 ENCOUNTER — Encounter (HOSPITAL_COMMUNITY): Payer: Self-pay | Admitting: Physician Assistant

## 2018-03-14 DIAGNOSIS — T148XXA Other injury of unspecified body region, initial encounter: Secondary | ICD-10-CM

## 2018-03-14 DIAGNOSIS — N179 Acute kidney failure, unspecified: Secondary | ICD-10-CM

## 2018-03-14 DIAGNOSIS — I2699 Other pulmonary embolism without acute cor pulmonale: Principal | ICD-10-CM

## 2018-03-14 DIAGNOSIS — R578 Other shock: Secondary | ICD-10-CM

## 2018-03-14 DIAGNOSIS — J449 Chronic obstructive pulmonary disease, unspecified: Secondary | ICD-10-CM

## 2018-03-14 DIAGNOSIS — R58 Hemorrhage, not elsewhere classified: Secondary | ICD-10-CM

## 2018-03-14 HISTORY — PX: IR IVC FILTER PLMT / S&I /IMG GUID/MOD SED: IMG701

## 2018-03-14 LAB — CBC WITH DIFFERENTIAL/PLATELET
Band Neutrophils: 2 %
Band Neutrophils: 1 %
Basophils Absolute: 0 10*3/uL (ref 0.0–0.1)
Basophils Absolute: 0 10*3/uL (ref 0.0–0.1)
Basophils Relative: 0 %
Basophils Relative: 0 %
Eosinophils Absolute: 0 10*3/uL (ref 0.0–0.7)
Eosinophils Absolute: 0 10*3/uL (ref 0.0–0.7)
Eosinophils Relative: 0 %
Eosinophils Relative: 0 %
HCT: 26.6 % — ABNORMAL LOW (ref 36.0–46.0)
HCT: 22.8 % — ABNORMAL LOW (ref 36.0–46.0)
Hemoglobin: 8.9 g/dL — ABNORMAL LOW (ref 12.0–15.0)
Hemoglobin: 7.8 g/dL — ABNORMAL LOW (ref 12.0–15.0)
Lymphocytes Relative: 5 %
Lymphocytes Relative: 6 %
Lymphs Abs: 1.6 10*3/uL (ref 0.7–4.0)
Lymphs Abs: 1.9 10*3/uL (ref 0.7–4.0)
MCH: 28.6 pg (ref 26.0–34.0)
MCH: 29 pg (ref 26.0–34.0)
MCHC: 33.5 g/dL (ref 30.0–36.0)
MCHC: 34.2 g/dL (ref 30.0–36.0)
MCV: 85.5 fL (ref 78.0–100.0)
MCV: 84.8 fL (ref 78.0–100.0)
Monocytes Absolute: 2.2 10*3/uL — ABNORMAL HIGH (ref 0.1–1.0)
Monocytes Absolute: 2.5 10*3/uL — ABNORMAL HIGH (ref 0.1–1.0)
Monocytes Relative: 7 %
Monocytes Relative: 8 %
Myelocytes: 1 %
Neutro Abs: 28.3 10*3/uL — ABNORMAL HIGH (ref 1.7–7.7)
Neutro Abs: 26.7 10*3/uL — ABNORMAL HIGH (ref 1.7–7.7)
Neutrophils Relative %: 85 %
Neutrophils Relative %: 85 %
Platelets: 284 10*3/uL (ref 150–400)
Platelets: 260 10*3/uL (ref 150–400)
RBC: 3.11 MIL/uL — ABNORMAL LOW (ref 3.87–5.11)
RBC: 2.69 MIL/uL — ABNORMAL LOW (ref 3.87–5.11)
RDW: 17.1 % — ABNORMAL HIGH (ref 11.5–15.5)
RDW: 17.5 % — ABNORMAL HIGH (ref 11.5–15.5)
WBC: 32.1 10*3/uL — ABNORMAL HIGH (ref 4.0–10.5)
WBC: 31.1 10*3/uL — ABNORMAL HIGH (ref 4.0–10.5)

## 2018-03-14 LAB — BLOOD GAS, ARTERIAL
Acid-base deficit: 7.6 mmol/L — ABNORMAL HIGH (ref 0.0–2.0)
Bicarbonate: 16.7 mmol/L — ABNORMAL LOW (ref 20.0–28.0)
Drawn by: 24487
O2 Content: 2 L/min
O2 Saturation: 98.8 %
Patient temperature: 98.4
pCO2 arterial: 29.2 mmHg — ABNORMAL LOW (ref 32.0–48.0)
pH, Arterial: 7.374 (ref 7.350–7.450)
pO2, Arterial: 139 mmHg — ABNORMAL HIGH (ref 83.0–108.0)

## 2018-03-14 LAB — CBC
HCT: 20.9 % — ABNORMAL LOW (ref 36.0–46.0)
Hemoglobin: 6.6 g/dL — CL (ref 12.0–15.0)
MCH: 28.7 pg (ref 26.0–34.0)
MCHC: 31.6 g/dL (ref 30.0–36.0)
MCV: 90.9 fL (ref 78.0–100.0)
Platelets: 225 10*3/uL (ref 150–400)
RBC: 2.3 MIL/uL — ABNORMAL LOW (ref 3.87–5.11)
RDW: 17 % — ABNORMAL HIGH (ref 11.5–15.5)
WBC: 29.5 10*3/uL — ABNORMAL HIGH (ref 4.0–10.5)

## 2018-03-14 LAB — URINE CULTURE: Culture: <10000 — AB

## 2018-03-14 LAB — BASIC METABOLIC PANEL
Anion gap: 9 (ref 5–15)
BUN: 20 mg/dL (ref 6–20)
CO2: 16 mmol/L — ABNORMAL LOW (ref 22–32)
Calcium: 7.6 mg/dL — ABNORMAL LOW (ref 8.9–10.3)
Chloride: 111 mmol/L (ref 101–111)
Creatinine, Ser: 2.16 mg/dL — ABNORMAL HIGH (ref 0.44–1.00)
GFR calc Af Amer: 24 mL/min — ABNORMAL LOW (ref >60)
GFR calc non Af Amer: 21 mL/min — ABNORMAL LOW (ref >60)
Glucose, Bld: 169 mg/dL — ABNORMAL HIGH (ref 65–99)
Potassium: 4.9 mmol/L (ref 3.5–5.1)
Sodium: 136 mmol/L (ref 135–145)

## 2018-03-14 LAB — GLUCOSE, CAPILLARY: Glucose-Capillary: 177 mg/dL — ABNORMAL HIGH (ref 65–99)

## 2018-03-14 LAB — PREPARE RBC (CROSSMATCH)

## 2018-03-14 LAB — TROPONIN I: Troponin I: 0.04 ng/mL (ref <0.03)

## 2018-03-14 LAB — LACTIC ACID, PLASMA: Lactic Acid, Venous: 4.7 mmol/L (ref 0.5–1.9)

## 2018-03-14 MED ORDER — SODIUM CHLORIDE 0.9 % IV SOLN: Freq: Once | INTRAVENOUS | Status: DC

## 2018-03-14 MED ORDER — MORPHINE SULFATE (PF) 2 MG/ML IV SOLN
2.0000 mg | Freq: Once | INTRAVENOUS | Status: AC
  Administered 2018-03-14: 2 mg via INTRAVENOUS
  Filled 2018-03-14: qty 1

## 2018-03-14 MED ORDER — MIDAZOLAM HCL 2 MG/2ML IJ SOLN
INTRAMUSCULAR | Status: AC
  Filled 2018-03-14: qty 2

## 2018-03-14 MED ORDER — FENTANYL CITRATE (PF) 100 MCG/2ML IJ SOLN
INTRAMUSCULAR | Status: AC
  Filled 2018-03-14: qty 2

## 2018-03-14 MED ORDER — SODIUM CHLORIDE 0.9 % IV BOLUS (SEPSIS)
500.0000 mL | Freq: Once | INTRAVENOUS | Status: AC
  Administered 2018-03-14: 500 mL via INTRAVENOUS

## 2018-03-14 MED ORDER — SODIUM CHLORIDE 0.9 % IV BOLUS (SEPSIS)
1000.0000 mL | Freq: Once | INTRAVENOUS | Status: AC
  Administered 2018-03-14: 1000 mL via INTRAVENOUS

## 2018-03-14 MED ORDER — SODIUM CHLORIDE 0.9 % IV SOLN
0.0000 ug/min | INTRAVENOUS | Status: DC
  Administered 2018-03-15: 20 ug/min via INTRAVENOUS
  Administered 2018-03-15: 25 ug/min via INTRAVENOUS
  Filled 2018-03-14: qty 10

## 2018-03-14 MED ORDER — IPRATROPIUM-ALBUTEROL 0.5-2.5 (3) MG/3ML IN SOLN
3.0000 mL | Freq: Four times a day (QID) | RESPIRATORY_TRACT | Status: DC
  Administered 2018-03-14: 3 mL via RESPIRATORY_TRACT
  Filled 2018-03-14: qty 3

## 2018-03-14 MED ORDER — ACETAMINOPHEN 10 MG/ML IV SOLN
1000.0000 mg | Freq: Once | INTRAVENOUS | Status: AC
  Administered 2018-03-14: 1000 mg via INTRAVENOUS
  Filled 2018-03-14: qty 100

## 2018-03-14 MED ORDER — CIPROFLOXACIN IN D5W 400 MG/200ML IV SOLN
400.0000 mg | INTRAVENOUS | Status: DC
  Administered 2018-03-15: 400 mg via INTRAVENOUS
  Filled 2018-03-14: qty 200

## 2018-03-14 MED ORDER — SODIUM CHLORIDE 0.9 % IV BOLUS (SEPSIS)
500.0000 mL | Freq: Once | INTRAVENOUS | Status: AC
  Administered 2018-03-14 (×2): 500 mL via INTRAVENOUS

## 2018-03-14 MED ORDER — SODIUM CHLORIDE 0.9 % IV BOLUS (SEPSIS): 500.0000 mL | Freq: Once | INTRAVENOUS | Status: DC

## 2018-03-14 MED ORDER — SENNA 8.6 MG PO TABS
1.0000 | ORAL_TABLET | Freq: Every day | ORAL | Status: DC
  Administered 2018-03-14 – 2018-03-20 (×5): 8.6 mg via ORAL
  Filled 2018-03-14 (×7): qty 1

## 2018-03-14 MED ORDER — ORAL CARE MOUTH RINSE
15.0000 mL | Freq: Two times a day (BID) | OROMUCOSAL | Status: DC
  Administered 2018-03-15 – 2018-03-26 (×18): 15 mL via OROMUCOSAL

## 2018-03-14 MED ORDER — CHLORHEXIDINE GLUCONATE 0.12 % MT SOLN
15.0000 mL | Freq: Two times a day (BID) | OROMUCOSAL | Status: DC
  Administered 2018-03-14 – 2018-03-26 (×22): 15 mL via OROMUCOSAL
  Filled 2018-03-14 (×13): qty 15

## 2018-03-14 MED ORDER — FENTANYL CITRATE (PF) 100 MCG/2ML IJ SOLN
INTRAMUSCULAR | Status: AC | PRN
  Administered 2018-03-14: 25 ug via INTRAVENOUS

## 2018-03-14 MED ORDER — IPRATROPIUM-ALBUTEROL 0.5-2.5 (3) MG/3ML IN SOLN
3.0000 mL | Freq: Four times a day (QID) | RESPIRATORY_TRACT | Status: DC | PRN
  Administered 2018-03-17: 3 mL via RESPIRATORY_TRACT
  Filled 2018-03-14: qty 3

## 2018-03-14 MED ORDER — IPRATROPIUM-ALBUTEROL 0.5-2.5 (3) MG/3ML IN SOLN
3.0000 mL | Freq: Two times a day (BID) | RESPIRATORY_TRACT | Status: DC
  Administered 2018-03-15 – 2018-03-23 (×15): 3 mL via RESPIRATORY_TRACT
  Filled 2018-03-14 (×18): qty 3

## 2018-03-14 MED ORDER — SODIUM CHLORIDE 0.9 % IV SOLN
250.0000 mL | INTRAVENOUS | Status: DC | PRN
  Administered 2018-03-16: 250 mL via INTRAVENOUS

## 2018-03-14 MED ORDER — TRAZODONE HCL 50 MG PO TABS
150.0000 mg | ORAL_TABLET | Freq: Every evening | ORAL | Status: DC | PRN
  Administered 2018-03-23: 21:00:00 150 mg via ORAL
  Filled 2018-03-14: qty 1

## 2018-03-14 MED ORDER — DOCUSATE SODIUM 100 MG PO CAPS
100.0000 mg | ORAL_CAPSULE | Freq: Two times a day (BID) | ORAL | Status: DC
  Administered 2018-03-14 – 2018-03-26 (×21): 100 mg via ORAL
  Filled 2018-03-14 (×25): qty 1

## 2018-03-14 MED ORDER — MIDAZOLAM HCL 2 MG/2ML IJ SOLN
INTRAMUSCULAR | Status: AC | PRN
  Administered 2018-03-14: 0.5 mg via INTRAVENOUS

## 2018-03-14 MED ORDER — LIDOCAINE HCL 1 % IJ SOLN
INTRAMUSCULAR | Status: AC
  Filled 2018-03-14: qty 20

## 2018-03-14 NOTE — Progress Notes (Signed)
Patient has had only 10 cc of urine output all shift. This is the second foley placed. Catheter is in the bladder. Bladder scanner read greater than 800 on first scan and repeat scan read greater than 925. NP, Brandi, called and made aware. NP also made MD aware. Providers think the bladder scanner may be incorrect and is reading blood in the abdominal wall instead of urine. Received order from NP to give 500 cc NS bolus and can repeat up to 1 L to improve bp. Bp has been trending down with systolic in the 11'A-57'X. If no improvement with, ok to start Neo. Per NP, Dr. Pearline Cables will come take a look at patient and ultrasound abdomen.

## 2018-03-14 NOTE — H&P (Signed)
Chief Complaint: Abdominal wall hematoma  Referring Physician(s): Rigoberto Noel  Supervising Physician: Markus Daft  Patient Status: [facility] - In-pt  History of Present Illness: [name] is a 77 y.o. female with PMHx significant for CAD, COPD, PVD, and prior GI bleed from small bowel AVM.  She was admitted on 3/10 with respiratory distress and found at tha time to have a small LLL PE which appeared subacute to chronic.   She was also found to have a left common femoral DVT.   ECHO showed normal LVEF and normal RV size and function with no signs of heart strain.   CT Abdomen at the same time showed SMA stenosis, for which she had a stent placed 3/18.   She was started on a heparin gtt for treatment of her PE, as well as Plavix.   Following the SMA stent, she had continuous abdominal pain which was thought to be due to ischemic colitis.   Her Hgb continued to drop. It fell to 6.6 and she became hypotensive.  She was transfused one unit of packed RBCs on the floor and a CT Abdomen was done.  CT showed a large 16 x 12cm abdominal wall hematoma.   She was then transferred to the ICU and found to be in hemorrhagic shock.   She was transfused a 2nd unit of blood, heparin and Plavix was discontinued.  She was also transfused with a unit of platelets since she had been on Plavix.   We have been asked to place an IVC filter she she can no longer be anticoagulated.   Creatinine is 2.16. AKI likely due to ATN. Will avoid contrast.  She is NPO.  Past Medical History:  Diagnosis Date  . Anemia    bld. transfusion post lumbar surgery- 2012  . Anxiety   . Arthralgia    NOS  . Blood transfusion 2011   WITH BACK SURGERY  . CAD (coronary artery disease)    s/p CABG 2004; s/p DES to LM in 2010;  [place] 10/29/11: EF 50-55%, mild elevated filling pressures, no pulmonary HTN, LM 90% ISR, LAD and CFX occluded, S-RI occluded (old), S-OM3 ok and L-LAD ok, native nondominant RCA 95% -   med rx recommended ; Lexiscan Myoview 7/13 at [facility]: demonstrated "normal LV function, anterior attenuation and localized ischemia, inferior, basilar, mid section"  . Carotid artery occlusion    moderate  . Chronic diastolic heart failure (HCC)    Echo 9/10: EF 30-86%, grade 1 diastolic dysfunction  . COPD (chronic obstructive pulmonary disease) ([place])    Emphysema dxed by Dr. Woody Seller in [place] based on PFTs per pt in 2006; placed on albuterol  . Depression    TAKES CELEXA  AND  (OFF- WELBUTRIN)  . DVT of lower extremity (deep venous thrombosis) (HCC)    recurrent. bilateral (2 episodes)  . Dyspnea   . Dysrhythmia    afib with cabg  . Exertional angina (HCC)    Treated with Isosorbide, Ranexa, amlodipine; intolerant to metoprolol  . GERD (gastroesophageal reflux disease)   . HLD (hyperlipidemia)   . Hypertension   . Hyperthyroidism   . Obesity (BMI 30-39.9) 2009   BMI 33  . Osteoarthritis   . Pneumonia   . PVD (peripheral vascular disease) ([place])   . Sleep apnea 2012   USED CPAP THEN  STARTED USING SPIRIVA , Nov. 2013- last evaluation , changed from mask to aparatus that is just for her nose.Marland Kitchen  Past Surgical History:  Procedure Laterality Date  . ANKLE SURGERY  2004  . [place]   lower; another scheduled, opt. reports 4 back- lumbar, 3 cerv. fusions  for later 2009  . BREAST ENHANCEMENT SURGERY    . CARDIAC CATHETERIZATION  11/2009   Patent LIMA to LAD and patent SVG to OM1. Occluded SVG to ramus and diagonal. Left main: 90% ostial stenosis, LCX 60-70% proximal stenosis  . COLONOSCOPY WITH PROPOFOL N/A 02/17/2017   Procedure: COLONOSCOPY WITH PROPOFOL;  Surgeon: Jerene Bears, MD;  Location: [facility] ENDOSCOPY;  Service: Endoscopy;  Laterality: N/A;  . CORONARY ANGIOPLASTY WITH STENT PLACEMENT  11/2009   Drug eluting stent to left main artery: 4.0 X 12 mm Ion   . CORONARY ARTERY BYPASS GRAFT  2004  . ESOPHAGOGASTRODUODENOSCOPY N/A 02/14/2017   Procedure:  ESOPHAGOGASTRODUODENOSCOPY (EGD);  Surgeon: Doran Stabler, MD;  Location[facility] END[facility]COPY;  Service: Endoscopy;  Laterality: N/A;  . EYE SURGERY  2006   BILATERAL CAT EXT.   Marland Kitchen GIVENS CAPSULE STUDY N/A 02/15/2017   Procedure: GIVENS CAPSULE STUDY;  Surgeon: Doran Stabler, MD;  Location: [place];  Service: Endoscopy;  Laterality: N/A;  . GROIN MASS OPEN BIOPSY  2004  . IR PARACENTESIS  03/10/2018  . JOINT REPLACEMENT  2012    BILATERAL KNEES  . JOINT REPLACEMENT  2010    RIGHT HIP REPLACEMENT  . NECK SURGERY  12/08   Due for repeat neck surgery  . PERIPHERAL VASCULAR INTERVENTION Left 03/05/2018   Procedure: PERIPHERAL VASCULAR INTERVENTION;  Surgeon: Elam Dutch, MD;  Location: [place] CV LAB;  Service: Cardiovascular;  Laterality: Left;  Attempted unsuccess\ful Per Dr. Eden Lathe  . PERIPHERAL VASCULAR INTERVENTION  03/08/2018   Procedure: PERIPHERAL VASCULAR INTERVENTION;  Surgeon: Waynetta Sandy, MD;  Location: [place] CV LAB;  Service: Cardiovascular;;  SMA Stent   . REVERSE SHOULDER ARTHROPLASTY  02/26/2012   Procedure: REVERSE SHOULDER ARTHROPLASTY;  Surgeon: Marin Shutter, MD;  Location: [place];  Service: Orthopedics;  Laterality: Right;  RIGHT SHOULDER REVERSED ARTHROPLASTY  . SHOULDER ARTHROSCOPY WITH SUBACROMIAL DECOMPRESSION Left 02/10/2013   Procedure: SHOULDER ARTHROSCOPY WITH SUBACROMIAL DECOMPRESSION DISTAL CLAVICLE RESECTION;  Surgeon: Marin Shutter, MD;  Location: [place];  Service: Orthopedics;  Laterality: Left;  DISTAL CLAVICLE RESECTION  . TOTAL HIP ARTHROPLASTY  2007   Right  . VISCERAL ANGIOGRAPHY N/A 03/05/2018   Procedure: VISCERAL ANGIOGRAPHY;  Surgeon: Elam Dutch, MD;  Location: [place] [facility];  Service: Cardiovascular;  Laterality: N/A;  . VISCERAL ANGIOGRAPHY N/A 03/08/2018   Procedure: VISCERAL ANGIOGRAPHY;  Surgeon: Waynetta Sandy, MD;  Location: [place] [facility];  Service: Cardiovascular;  Laterality: N/A;     Allergies: Zolpidem tartrate; Pitavastatin; Ropinirole; and Penicillins  Medications: Prior to Admission medications   Medication Sig Start Date End Date Taking? Authorizing Provider  allopurinol (ZYLOPRIM) 100 MG tablet Take 100 mg by mouth daily. 01/30/17  Yes [provider]  aspirin EC 81 MG tablet Take 81 mg by mouth daily.   Yes [provider]  citalopram (CELEXA) 20 MG tablet Take 20 mg by mouth 3 (three) times daily.    Yes [provider]  CVS D3 1000 units capsule Take 1,000 Units by mouth daily. 01/14/18  Yes [provider]  ferrous sulfate 325 (65 FE) MG tablet Take 325 mg by mouth daily with breakfast.   Yes [provider]  INCRUSE ELLIPTA 62.5 MCG/INH AEPB Inhale  1 puff into the lungs 2 (two) times daily.  11/30/16  Yes [provider]  Ipratropium-Albuterol (COMBIVENT) 20-100 MCG/ACT AERS respimat Inhale 1 puff into the lungs every 6 (six) hours as needed for wheezing.   Yes [provider]  isosorbide mononitrate (IMDUR) 120 MG 24 hr tablet Take 120 mg by mouth daily.   Yes [provider]  nitroGLYCERIN (NITROSTAT) 0.4 MG SL tablet Place 1 tablet (0.4 mg total) under the tongue every 5 (five) minutes as needed for chest pain. For chest pain 04/24/16  Yes Fay Records, MD  omeprazole (PRILOSEC) 40 MG capsule Take 40 mg by mouth 2 (two) times daily.    Yes [provider]  Oxycodone HCl 10 MG TABS Take 10 mg by mouth every 6 (six) hours as needed for pain. 01/25/17  Yes [provider]  pilocarpine (PILOCAR) 1 % ophthalmic solution Place 1 drop into both eyes 2 (two) times daily. 11/30/16  Yes [provider]  potassium chloride SA (K-DUR,KLOR-CON) 20 MEQ tablet Take 20 mEq by mouth daily.   Yes [provider]  pramipexole (MIRAPEX) 0.25 MG tablet Take 0.25 mg by mouth at bedtime. 01/14/18  Yes [provider]  roflumilast (DALIRESP) 500 MCG TABS tablet Take 500  mcg by mouth daily.    Yes [provider]  traMADol (ULTRAM) 50 MG tablet Take 150 tablets by mouth at bedtime.  11/08/17  Yes [provider]  traZODone (DESYREL) 50 MG tablet Take 150 mg by mouth at bedtime.    Yes [provider]     Family History  Problem Relation Age of Onset  . Ovarian cancer Mother   . Cancer Mother   . Lung cancer Father   . COPD Father   . Heart disease Father   . Cancer Unknown   . Heart disease Unknown   . Colitis Unknown   . Heart disease Sister   . Cancer Sister     Social History   Socioeconomic History  . Marital status: Married    Spouse name: Not on file  . Number of children: 3  . Years of education: Not on file  . Highest education level: Not on file  Occupational History  . Occupation: retired    Comment: Electronics engineer: RETIRED  Social Needs  . Financial resource strain: Not on file  . Food insecurity:    Worry: Not on file    Inability: Not on file  . Transportation needs:    Medical: Not on file    Non-medical: Not on file  Tobacco Use  . Smoking status: Former Smoker    Years: 45.00    Last attempt to quit: 12/22/2002    Years since quitting: 15.2  . Smokeless tobacco: Never Used  Substance and Sexual Activity  . Alcohol use: No    Alcohol/week: 0.0 oz  . Drug use: No  . Sexual activity: Never  Lifestyle  . Physical activity:    Days per week: Not on file    Minutes per session: Not on file  . Stress: Not on file  Relationships  . Social connections:    Talks on phone: Not on file    Gets together: Not on file    Attends religious service: Not on file    Active member of club or organization: Not on file    Attends meetings of clubs or organizations: Not on file    Relationship status: Not on file  Other Topics Concern  . Not on file  Social History Narrative   Married 3 years, lives with husband.   3 grown children.    Review of Systems: A 12 point ROS discussed and  pertinent positives are indicated in the HPI above.  All other systems are negative. Review of Systems  Vital Signs: BP (!) 129/52   Pulse 95   Temp 98.3 F (36.8 C) (Oral)   Resp (!) 24   Ht 5\' 8"  (1.727 m)   Wt 177 lb 12.8 oz (80.6 kg) Comment: scale a  SpO2 96%   BMI 27.03 kg/m   Physical Exam  Constitutional: She is oriented to person, place, and time. She appears well-developed.  Currently hemodynamically stable  HENT:  Head: Normocephalic and atraumatic.  Eyes: EOM are normal.  Neck: Normal range of motion.  Cardiovascular: Normal rate and regular rhythm.  Pulmonary/Chest: Effort normal. No respiratory distress.  Abdominal: There is tenderness.    Red = very tender to palpation, area of firmness c/w hematoma Blue = area of ecchymosis  Musculoskeletal: Normal range of motion.  Neurological: She is alert and oriented to person, place, and time.  Skin: Skin is warm and dry.  Psychiatric: She has a normal mood and affect. Her behavior is normal. Judgment and thought content normal.  Vitals reviewed.   Imaging: Ct Abdomen Pelvis Wo Contrast  Addendum Date: 03/14/2018   ADDENDUM REPORT: 03/14/2018 04:14 ADDENDUM: These results were called by telephone at the time of interpretation on 03/14/2018 at 4:04 am to [facility] , who verbally acknowledged these results. Electronically Signed   By: Ulyses Jarred M.D.   On: 03/14/2018 04:14   Result Date: 03/14/2018 CLINICAL DATA:  Abdominal pain and fever EXAM: CT ABDOMEN AND PELVIS WITHOUT CONTRAST TECHNIQUE: Multidetector CT imaging of the abdomen and pelvis was performed following the standard protocol without IV contrast. COMPARISON:  CT abdomen pelvis 03/09/2018 FINDINGS: Examination is degraded by streak artifact from external patient devices and from the arms down positioning of the patient, creating multiple streak artifacts and decreasing sensitivity and specificity of this scan. Lower chest: Right pleural effusion and  associated atelectasis. Hepatobiliary: Right upper quadrant ascites. Nodular appearance of the liver. No focal liver lesion. Status post cholecystectomy. Pancreas: Atrophic appearance of the pancreas. Spleen: Normal. Adrenals/Urinary Tract: --Adrenal glands: Normal. --Right kidney/ureter: No hydronephrosis, perinephric stranding or nephrolithiasis. No obstructing ureteral stones. --Left kidney/ureter: No hydronephrosis, perinephric stranding or nephrolithiasis. No obstructing ureteral stones. --Urinary bladder: Normal appearance for the degree of distention. Stomach/Bowel: --Stomach/Duodenum: No hiatal hernia or other gastric abnormality. Normal duodenal course. --Small bowel: No dilatation or inflammation. --Colon: Diffuse colonic diverticulosis without acute inflammation. --Appendix: Normal. Vascular/Lymphatic: Atherosclerotic calcification is present within the non-aneurysmal abdominal aorta, without hemodynamically significant stenosis. No abdominal or pelvic lymphadenopathy. Reproductive: Pelvis largely obscured by streak artifact. Musculoskeletal. Right total hip arthroplasty. L1-L3 posterior spinal fusion. Other: Large anterior abdominal wall hematoma measuring up to 16.9 x 11.9 x 12.4 cm. This is new compared to the recent study from 03/09/2018. This exerts mass effect on the intra-abdominal contents. IMPRESSION: 1. New, large anterior abdominal wall hematoma measuring 16.9 x 11.9 x 12.4 cm. 2. Small volume ascites with nodular appearance of the liver concerning for cirrhosis. 3. Medium-sized right pleural effusion with associated atelectasis. 4. Diffuse colonic diverticulosis without acute inflammation. 5.  Aortic Atherosclerosis (ICD10-I70.0). Electronically Signed: By: Ulyses Jarred M.D. On: 03/14/2018 03:45   Ct Angio Chest Pe W/cm &/or Wo Cm  Result Date: 03/01/2018  CLINICAL DATA:  Subacute onset of nausea. Generalized abdominal pain. Mild generalized chest pain and shortness of breath. EXAM: CT  ANGIOGRAPHY CHEST CT ABDOMEN AND PELVIS WITH CONTRAST TECHNIQUE: Multidetector CT imaging of the chest was performed using the standard protocol during bolus administration of intravenous contrast. Multiplanar CT image reconstructions and MIPs were obtained to evaluate the vascular anatomy. Multidetector CT imaging of the abdomen and pelvis was performed using the standard protocol during bolus administration of intravenous contrast. CONTRAST:  80 mL of Isovue 370 IV contrast COMPARISON:  CT of the abdomen and pelvis performed 11/08/2017, and CT of the thoracic spine performed 07/26/2014 FINDINGS: CTA CHEST FINDINGS Cardiovascular: There appears to be subacute or chronic pulmonary embolus within the pulmonary artery to the left lower lobe, extending minimally into segmental branches. No additional pulmonary embolus is identified. The RV/LV ratio of 1.1 may reflect right heart strain. Diffuse coronary artery calcifications are seen. Diffuse calcification is noted along the aortic arch and proximal great vessels. The heart remains normal in size. The patient is status post median sternotomy. There is a chronic fracture of the inferior-most sternal wire. Mediastinum/Nodes: The mediastinum is otherwise unremarkable in appearance. No mediastinal lymphadenopathy is seen. No pericardial effusion is identified. The thyroid gland is grossly unremarkable. No axillary lymphadenopathy is seen. Lungs/Pleura: Minimal bilateral atelectasis is noted. The lungs are otherwise clear. Mild scarring is noted at the lung apices. No significant focal consolidation, pleural effusion or pneumothorax is seen. No masses are identified. Musculoskeletal: No acute osseous abnormalities are identified. The patient's right shoulder arthroplasty is incompletely imaged but appears grossly unremarkable. The visualized musculature is unremarkable in appearance. Diffuse calcification is noted about the patient's bilateral breast implants. Review of the  MIP images confirms the above findings. CT ABDOMEN and PELVIS FINDINGS Hepatobiliary: The liver is unremarkable in appearance. The patient is status post cholecystectomy, with clips noted at the gallbladder fossa. There is corresponding distention of the intrahepatic biliary ducts and common bile duct. Pancreas: The pancreas is atrophic and grossly unremarkable in appearance. Spleen: The spleen is unremarkable in appearance. Adrenals/Urinary Tract: Minimal calcification is noted at the left adrenal gland, likely adjacent to a small underlying adrenal lesion. The right adrenal gland is unremarkable. A right renal cyst is again noted, better characterized on the prior study. The kidneys are otherwise unremarkable. There is no evidence of hydronephrosis. No perinephric stranding is seen. Stomach/Bowel: The stomach is unremarkable in appearance. The small bowel is within normal limits. The appendix is normal in caliber, without evidence of appendicitis. The colon is unremarkable in appearance. Vascular/Lymphatic: Diffuse calcification is seen along the abdominal aorta and its branches, including along the right renal artery and superior mesenteric artery. There is likely relatively severe luminal narrowing at the proximal superior mesenteric artery. The abdominal aorta is otherwise grossly unremarkable. The inferior vena cava is grossly unremarkable. No retroperitoneal lymphadenopathy is seen. No pelvic sidewall lymphadenopathy is identified. Reproductive: The bladder is mildly distended and grossly unremarkable. The uterus is unremarkable in appearance. The ovaries are grossly symmetric. No suspicious adnexal masses are seen. Other: No additional soft tissue abnormalities are seen. Musculoskeletal: No acute osseous abnormalities are identified. The patient's right hip arthroplasty is incompletely imaged, but appears grossly unremarkable. The patient is status post lumbar spinal fusion at L1-L3, with underlying  decompression from L1-L4. The visualized musculature is unremarkable in appearance. Review of the MIP images confirms the above findings. IMPRESSION: 1. Apparent subacute or chronic pulmonary embolus within the pulmonary artery to the left  lower lung lobe, extending minimally into segmental branches. RV/LV ratio of 1.1 may reflect right heart strain. Would correlate with the patient's symptoms. 2. Minimal bilateral atelectasis noted. Mild scarring at the lung apices. 3. Diffuse coronary artery calcifications seen. 4. Diffuse aortic atherosclerosis, with diffuse calcification along the right renal artery and superior mesenteric artery. Likely relatively severe luminal narrowing at the proximal superior mesenteric artery. Would correlate for any associated symptoms. Critical Value/emergent results were called by telephone at the time of interpretation on 03/01/2018 at 1:00 am to the ER clinical team, who verbally acknowledged these results. Electronically Signed   By: Garald Balding M.D.   On: 03/01/2018 01:03   Ct Abdomen Pelvis W Contrast  Result Date: 03/01/2018 CLINICAL DATA:  Subacute onset of nausea. Generalized abdominal pain. Mild generalized chest pain and shortness of breath. EXAM: CT ANGIOGRAPHY CHEST CT ABDOMEN AND PELVIS WITH CONTRAST TECHNIQUE: Multidetector CT imaging of the chest was performed using the standard protocol during bolus administration of intravenous contrast. Multiplanar CT image reconstructions and MIPs were obtained to evaluate the vascular anatomy. Multidetector CT imaging of the abdomen and pelvis was performed using the standard protocol during bolus administration of intravenous contrast. CONTRAST:  80 mL of Isovue 370 IV contrast COMPARISON:  CT of the abdomen and pelvis performed 11/08/2017, and CT of the thoracic spine performed 07/26/2014 FINDINGS: CTA CHEST FINDINGS Cardiovascular: There appears to be subacute or chronic pulmonary embolus within the pulmonary artery to the  left lower lobe, extending minimally into segmental branches. No additional pulmonary embolus is identified. The RV/LV ratio of 1.1 may reflect right heart strain. Diffuse coronary artery calcifications are seen. Diffuse calcification is noted along the aortic arch and proximal great vessels. The heart remains normal in size. The patient is status post median sternotomy. There is a chronic fracture of the inferior-most sternal wire. Mediastinum/Nodes: The mediastinum is otherwise unremarkable in appearance. No mediastinal lymphadenopathy is seen. No pericardial effusion is identified. The thyroid gland is grossly unremarkable. No axillary lymphadenopathy is seen. Lungs/Pleura: Minimal bilateral atelectasis is noted. The lungs are otherwise clear. Mild scarring is noted at the lung apices. No significant focal consolidation, pleural effusion or pneumothorax is seen. No masses are identified. Musculoskeletal: No acute osseous abnormalities are identified. The patient's right shoulder arthroplasty is incompletely imaged but appears grossly unremarkable. The visualized musculature is unremarkable in appearance. Diffuse calcification is noted about the patient's bilateral breast implants. Review of the MIP images confirms the above findings. CT ABDOMEN and PELVIS FINDINGS Hepatobiliary: The liver is unremarkable in appearance. The patient is status post cholecystectomy, with clips noted at the gallbladder fossa. There is corresponding distention of the intrahepatic biliary ducts and common bile duct. Pancreas: The pancreas is atrophic and grossly unremarkable in appearance. Spleen: The spleen is unremarkable in appearance. Adrenals/Urinary Tract: Minimal calcification is noted at the left adrenal gland, likely adjacent to a small underlying adrenal lesion. The right adrenal gland is unremarkable. A right renal cyst is again noted, better characterized on the prior study. The kidneys are otherwise unremarkable. There is  no evidence of hydronephrosis. No perinephric stranding is seen. Stomach/Bowel: The stomach is unremarkable in appearance. The small bowel is within normal limits. The appendix is normal in caliber, without evidence of appendicitis. The colon is unremarkable in appearance. Vascular/Lymphatic: Diffuse calcification is seen along the abdominal aorta and its branches, including along the right renal artery and superior mesenteric artery. There is likely relatively severe luminal narrowing at the proximal superior mesenteric artery.  The abdominal aorta is otherwise grossly unremarkable. The inferior vena cava is grossly unremarkable. No retroperitoneal lymphadenopathy is seen. No pelvic sidewall lymphadenopathy is identified. Reproductive: The bladder is mildly distended and grossly unremarkable. The uterus is unremarkable in appearance. The ovaries are grossly symmetric. No suspicious adnexal masses are seen. Other: No additional soft tissue abnormalities are seen. Musculoskeletal: No acute osseous abnormalities are identified. The patient's right hip arthroplasty is incompletely imaged, but appears grossly unremarkable. The patient is status post lumbar spinal fusion at L1-L3, with underlying decompression from L1-L4. The visualized musculature is unremarkable in appearance. Review of the MIP images confirms the above findings. IMPRESSION: 1. Apparent subacute or chronic pulmonary embolus within the pulmonary artery to the left lower lung lobe, extending minimally into segmental branches. RV/LV ratio of 1.1 may reflect right heart strain. Would correlate with the patient's symptoms. 2. Minimal bilateral atelectasis noted. Mild scarring at the lung apices. 3. Diffuse coronary artery calcifications seen. 4. Diffuse aortic atherosclerosis, with diffuse calcification along the right renal artery and superior mesenteric artery. Likely relatively severe luminal narrowing at the proximal superior mesenteric artery. Would  correlate for any associated symptoms. Critical Value/emergent results were called by telephone at the time of interpretation on 03/01/2018 at 1:00 am to the ER clinical team, who verbally acknowledged these results. Electronically Signed   By: Garald Balding M.D.   On: 03/01/2018 01:03   Dg Chest Port 1 View  Result Date: 03/13/2018 CLINICAL DATA:  77 year old with shortness of breath. EXAM: PORTABLE CHEST 1 VIEW COMPARISON:  Chest CT 02/28/2018 and chest radiograph 11/08/2017 FINDINGS: Bilateral breast implants. No focal airspace disease or pulmonary edema. Right shoulder replacement. Atherosclerotic calcifications at the aortic arch. Prior CABG procedure. Sclerosis and degenerative changes at the left shoulder. Surgical hardware in the lumbar spine. Heart size is within normal limits. IMPRESSION: No acute chest findings. Electronically Signed   By: Markus Daft M.D.   On: 03/13/2018 12:52   Dg Abd 2 Views  Result Date: 03/12/2018 CLINICAL DATA:  Lower abdominal pain beginning this morning post vascular surgery EXAM: ABDOMEN - 2 VIEW COMPARISON:  03/08/2018 FINDINGS: Normal bowel gas pattern. No bowel dilatation or bowel wall thickening. Gas present in rectum. Bones demineralized with evidence of prior lumbar fusion and prior RIGHT hip replacement. Scattered degenerative changes of lower thoracic spine. Surgical clips RIGHT upper quadrant question cholecystectomy. Degenerative changes LEFT hip joint. Question vascular calcification versus 8 mm diameter calculus at upper pole LEFT kidney. IMPRESSION: No acute abnormalities. Electronically Signed   By: Lavonia Dana M.D.   On: 03/12/2018 19:28   Dg Abd Portable 1v  Result Date: 03/08/2018 CLINICAL DATA:  Nausea and vomiting EXAM: PORTABLE ABDOMEN - 1 VIEW COMPARISON:  CT 02/28/2018 FINDINGS: Surgical hardware in the upper lumbar spine. Surgical clips in the right upper quadrant. Nonobstructed bowel-gas pattern. Radiopaque material in the bladder. Probable  diverticulum on the right side of the bladder. Status post right hip replacement with fixating screw paralleling the cortex of the right inter pelvis. IMPRESSION: 1. Nonobstructed gas pattern 2. Contrast material within the bladder Electronically Signed   By: Donavan Foil M.D.   On: 03/08/2018 22:30   Ct Angio Abd/pel W/ And/or W/o  Result Date: 03/09/2018 CLINICAL DATA:  Upper abdominal pain and nausea after mesenteric stent placement today. EXAM: CTA ABDOMEN AND PELVIS wITHOUT AND WITH CONTRAST TECHNIQUE: Multidetector CT imaging of the abdomen and pelvis was performed using the standard protocol during bolus administration of intravenous contrast. Multiplanar reconstructed  images and MIPs were obtained and reviewed to evaluate the vascular anatomy. CONTRAST:  100 cc Isovue 370 IV COMPARISON:  Abdomen/pelvis CT 02/28/2018 FINDINGS: VASCULAR Aorta: Moderate atherosclerosis. Normal caliber aorta without aneurysm, dissection, vasculitis or significant stenosis. Celiac: Plaque at the origin causes approximately 50% stenosis. No dissection or vasculitis. Distal branches are patent. SMA: Patent proximal SMA stent with resolved origin stenosis. Calcified distally but patent. No evidence of dissection. No evidence of contrast extravasation. Renals: Calcified plaque at the origins. No dissection or vasculitis. IMA: Patent without evidence of aneurysm, dissection, vasculitis or significant stenosis. Inflow: Patent without evidence of aneurysm, dissection, vasculitis or significant stenosis. Proximal Outflow: Bilateral common femoral and visualized portions of the superficial and profunda femoral arteries are patent without evidence of aneurysm, dissection, vasculitis or significant stenosis. No evidence of pseudoaneurysm formation or active extravasation at the puncture site. Veins: Not well assessed on arterial phase imaging. There is presumed mixing of opacified and non-opacified blood in the mesenteric venous  confluence. Review of the MIP images confirms the above findings. NON-VASCULAR Lower chest: Linear atelectasis in both lower lobes. Trace right pleural effusion. Coronary artery calcifications. Peripherally calcified left breast implant partially included. Hepatobiliary: Slight capsular nodularity raises concern for cirrhosis. No discrete focal lesion. Postcholecystectomy with grossly unchanged biliary prominence allowing for arterial phase technique. Pancreas: Parenchymal atrophy. No ductal dilatation or inflammation. Spleen: Normal in size without focal abnormality. Adrenals/Urinary Tract: Mild left adrenal thickening. Normal right adrenal gland. Excreted IV contrast in both kidneys and urinary bladder from angiogram earlier this day. No hydronephrosis or perinephric edema. Bilateral renal cysts. Stomach/Bowel: Small hiatal hernia. Stomach is nondistended. Presence of intra-abdominal ascites limits bowel evaluation. There is colonic wall thickening involving the ascending colon, hepatic flexure, unlikely proximal transverse colon. No definite small bowel inflammation. Colonic diverticulosis throughout the colon. Lymphatic: No definite adenopathy. No evidence of retroperitoneal hemorrhage. Reproductive: Uterus obscured by right hip arthroplasty. Other: Development of moderate volume abdominopelvic ascites. Fluid may be minimally complex, however does not represent values concerning for hemorrhage. There is mild mesenteric edema. No free air. Small supraumbilical hernia contains fat and small amount of free fluid. Musculoskeletal: Postsurgical change in the lumbar spine, unchanged from prior. Right hip arthroplasty. IMPRESSION: VASCULAR 1. Placement of superior mesenteric artery stent which is widely patent. No evidence of dissection or postprocedural pseudoaneurysm/hematoma. 2. Diffuse aortic and branch atherosclerosis. NON-VASCULAR 1. Interval development of moderate abdominopelvic ascites from prior exam. Fluid  is minimally complex but does not represent hemorrhage. 2. Colonic wall thickening involving the cecum, ascending, and proximal transverse colon. This is new since prior CT. Exact etiology is uncertain. Infectious or inflammatory etiologies are considered, colonic wall edema related to reperfusion could have a similar appearance. No increased density to suggest bowel wall hemorrhage. 3. Colonic diverticulosis without specific findings to suggest diverticulitis. 4. Question of nodular hepatic contours raises concern for cirrhosis. Recommend correlation with cirrhosis risk factors. Electronically Signed   By: Jeb Levering M.D.   On: 03/09/2018 03:18   Ir Paracentesis  Result Date: 03/10/2018 INDICATION: Recent superior mesenteric artery stent placement. New ascites. Request for diagnostic paracentesis. EXAM: ULTRASOUND GUIDED RIGHT LATERAL ABDOMEN PARACENTESIS MEDICATIONS: 2% lidocaine = 10 mL COMPLICATIONS: None immediate. PROCEDURE: Informed written consent was obtained from the patient after a discussion of the risks, benefits and alternatives to treatment. A timeout was performed prior to the initiation of the procedure. Initial ultrasound scanning demonstrates a small amount of ascites within the right lateral abdomen. The right lateral abdomen was  prepped and draped in the usual sterile fashion. 1% lidocaine with epinephrine was used for local anesthesia. Following this, a 19 gauge, 7-cm, Yueh catheter was introduced. An ultrasound image was saved for documentation purposes. The paracentesis was performed. The catheter was removed and a dressing was applied. The patient tolerated the procedure well without immediate post procedural complication. FINDINGS: A total of approximately 100 ml of clear red fluid was removed. Samples were sent to the laboratory as requested by the clinical team. IMPRESSION: Successful ultrasound-guided paracentesis yielding 160mL of peritoneal fluid. Read by: Gareth Eagle, PA-C  Electronically Signed   By: Lucrezia Europe M.D.   On: 03/10/2018 11:13    Labs:  CBC: Recent Labs    03/13/18 0332 03/13/18 1329 03/13/18 1727 03/14/18 0402  WBC 10.4 19.8* 21.5* 29.5*  HGB 8.6* 7.4* 7.9* 6.6*  HCT 26.5* 22.7* 25.8* 20.9*  PLT 215 279 227 225    COAGS: No results for input(s): INR, APTT in the last 8760 hours.  BMP: Recent Labs    03/12/18 1449 03/13/18 0332 03/13/18 1329 03/14/18 0402  NA 138 136 134* 136  K 3.1* 3.6 4.2 4.9  CL 110 109 107 111  CO2 21* 20* 17* 16*  GLUCOSE 106* 119* 172* 169*  BUN 6 6 11 20   CALCIUM 8.0* 7.7* 7.8* 7.6*  CREATININE 0.60 0.68 1.37* 2.16*  GFRNONAA >60 >60 36* 21*  GFRAA >60 >60 42* 24*    LIVER FUNCTION TESTS: Recent Labs    02/28/18 1924 03/11/18 0337  BILITOT 0.3 0.6  AST 15 21  ALT 10* 19  ALKPHOS 85 61  PROT 6.4* 5.0*  ALBUMIN 3.3* 2.6*    TUMOR MARKERS: No results for input(s): AFPTM, CEA, CA199, CHROMGRNA in the last 8760 hours.  Assessment and Plan:  Anticoagulated for PE and DVT   Plavix for recent SMA stent  Abdominal wall hematoma with significant drop in Hgb.  Will proceed with placement of an IVC Filter today by Dr. Anselm Pancoast.  AKI = will avoid contrast and use CO2.  Risks and benefits discussed with the patient including, but not limited to bleeding, infection, contrast induced renal failure, filter fracture or migration which can lead to emergency surgery or even death, strut penetration with damage or irritation to adjacent structures and caval thrombosis.  All of the patient's questions were answered, patient is agreeable to proceed. Consent signed and in chart.  Thank you for this interesting consult.  I greatly enjoyed meeting [name] and look forward to participating in their care.  A copy of this report was sent to the requesting provider on this date.  Electronically Signed: Murrell Redden, PA-C 03/14/2018, 9:13 AM   I spent a total of 40 Minutes in face to face in  clinical consultation, greater than 50% of which was counseling/coordinating care for IVC filter.

## 2018-03-14 NOTE — Progress Notes (Signed)
Pt transferred to 2M14.

## 2018-03-14 NOTE — Progress Notes (Signed)
Critical troponin of 0.04. Reported by lab, [name].

## 2018-03-14 NOTE — Progress Notes (Signed)
eLink Physician-Brief Progress Note Patient Name: [name] DOB: 1941-08-09 MRN: [id number]   Date of Service  03/14/2018  HPI/Events of Note  Oliguria - No CVL. Last LVEF = 55% to 60%.  eICU Interventions  Will order: 1. Bolus with 0.9 NaCl 1 liter IV over 1 hour now.      Intervention Category Intermediate Interventions: Oliguria - evaluation and management  [name] 03/14/2018, 9:13 PM

## 2018-03-14 NOTE — Progress Notes (Signed)
Spoke with Lovey Newcomer NP, ordered to hold lasix 20mg  IV and hold  blood transfusion and obtain CBC first, NP also ordered stat blood gas because patient is also short of breath. CT abdomen also ordered . Will continue to monitor.

## 2018-03-14 NOTE — Procedures (Signed)
  Pre-operative Diagnosis: H/o PE and left leg DVT with intra-abdominal bleeding.  Not candidate for anticoagulation at this time       Post-operative Diagnosis: H/o PE and left leg DVT with intra-abdominal bleeding.  Not candidate for anticoagulation at this time   Indications: Needs PE prevention, not candidate for anticoagulation at this time.    Procedure: IVC filter placement with carbon dioxide.  Placement of [place] (retrievable filter)   Findings: IVC difficult to image due to spinal hardware and carbon dioxide.  Bilateral renal veins cannulated and identified.  Filter placed in IVC below renal veins.  Complications: No immediate     EBL: Minimal  Plan: Return to inpatient room.  Not sure if patient will be a candidate for filter retrieval in future.

## 2018-03-14 NOTE — Sedation Documentation (Signed)
Patient denies pain and is resting comfortably.  

## 2018-03-14 NOTE — Progress Notes (Signed)
Patient off unit to IR for IVC placement. Vitals stable .

## 2018-03-14 NOTE — Sedation Documentation (Signed)
75mg  fentanyl and 1.5mg  versed wasted in trash with [employer] in trash

## 2018-03-14 NOTE — Progress Notes (Signed)
NP at bedside,  500cc NS bolus ordered,  transfer to stepdown ordered, awaiting bed availability. Will continue to monitor

## 2018-03-14 NOTE — Progress Notes (Addendum)
She is hemodynamically improved but continues to have pain requiring morphine IV. Exam-interactive, mild tenderness left upper quadrant, right groin ecchymosis but no other obvious hematoma  IV heparin is on hold now due to rectus sheath hematoma and hemorrhagic shock which is now resolved. Discussed with Dr. [name], will proceed with IR consult for IVC filter placement.  Patient and daughter are in agreement.  She also has a history of GI bleed in the past.  Can probably try to resume aspirin and Plavix within the next few days  AKI -bladder scan showed 800 cc but foley did not put out any urine, flushed and then new Foley inserted but still no urine output  I have asked triad to resume care 3/25  [name] V. Elsworth Soho MD

## 2018-03-14 NOTE — Progress Notes (Signed)
CRITICAL VALUE ALERT  Critical Value:  Hb 6.6  Date & Time Notied: 03/14/2018 0455  Provider Notified:Dr. Jimmey Ralph  Orders Received/Actions taken: 2 U PRBC's

## 2018-03-14 NOTE — Progress Notes (Signed)
CRITICAL VALUE ALERT  Critical Value:  Lactic Acid 4.7  Date & Time Notied:  03/14/18 at [place]  Provider Notified: Dr. Elsworth Soho  Orders Received/Actions taken: no new verbal orders received.  Lactic acid critical value received by Doylene Bode RN who then reported it to Dr. Elsworth Soho. No new verbal orders received. Primary RN also informed.

## 2018-03-14 NOTE — Progress Notes (Signed)
   03/14/18 0033  Vitals  Temp 98.1 F (36.7 C)  Temp Source Oral  BP (!) 89/55  Pulse Rate 95  Resp 18  Oxygen Therapy  SpO2 97 %  O2 Device Nasal Cannula  O2 Flow Rate (L/min) 4 L/min  Pt's blood pressure low post 1 unit blood transfusion, pt to get 20mg  lasix IV in between transfusion, pt only have 50cc urinary output since 7pm  Jeannette Corpus NP  notified, awaiting call back.

## 2018-03-14 NOTE — Progress Notes (Signed)
[facility] Gastroenterology Progress Note  [name] 77 y.o. September 09, 1941  CC:  Nausea, vomiting, abdominal pain, colitis   Subjective:  Yesterday's events noted. Patient remains tachycardic. Continues to have abdominal distention as well as abdominal pain.   ROS : positive for shortness of breath. Positive for weakness   Objective: Vital signs in last 24 hours: Vitals:   03/14/18 0730 03/14/18 0825  BP: (!) 129/52   Pulse: 95   Resp: (!) 24   Temp: 97.6 F (36.4 C) 98.3 F (36.8 C)  SpO2: 96%     Physical Exam:  General. Alert and oriented 3. In mild distress. Heart. Rate rhythm regular. Tachycardia Abdomen. Mild distended, tenderness discomfort on palpation, bruise over  right inguinal area noted, are sounds present. No peritoneal signs Lower extremity. No significant edema   Lab Results: Recent Labs    03/12/18 0509  03/13/18 0332 03/13/18 1329 03/14/18 0402  NA 141   < > 136 134* 136  K 2.8*   < > 3.6 4.2 4.9  CL 110   < > 109 107 111  CO2 23   < > 20* 17* 16*  GLUCOSE 95   < > 119* 172* 169*  BUN 7   < > 6 11 20   CREATININE 0.61   < > 0.68 1.37* 2.16*  CALCIUM 8.1*   < > 7.7* 7.8* 7.6*  MG 1.6*  --  2.1  --   --    < > = values in this interval not displayed.   No results for input(s): AST, ALT, ALKPHOS, BILITOT, PROT, ALBUMIN in the last 72 hours. Recent Labs    03/13/18 1329 03/13/18 1727 03/14/18 0402  WBC 19.8* 21.5* 29.5*  NEUTROABS 17.4* 19.1*  --   HGB 7.4* 7.9* 6.6*  HCT 22.7* 25.8* 20.9*  MCV 88.7 87.2 90.9  PLT 279 227 225   No results for input(s): LABPROT, INR in the last 72 hours.    Assessment/Plan: - right-sided colitis. Most likely ischemia/reperfusion injury after placement of SMA stent. - abdominal wall hematoma. - Leukocytosis - respiratory failure - New-onset ascites. Paracentesis on 03/10/2018 showed SAAG >1.1 - CT scan showing nodular liver concerning for cirrhosis. - Anemia. No evidence of overt  bleeding  Recommendations ------------------------ Yesterday's events noted.Actually patient had improvement in abdominal pain when I saw her in the morning yesterday.Patient subsequently developing shortness of breath and hypoxemia.repeat blood work in the afternoon showed significant leukocytosis and decrease in hemoglobin. Patient was subsequently transferred to ICU.I had  Discussed  patient's situation yesterday with Dr. Grandville Silos.concern for internal bleeding was raised. Repeat  CT abdomen pelvis showed new 16.9 x 12.4 cm abdominal wall hematoma. Heparin and Plavix currently on hold. - plan for IVC filter placement by interventional radiology. - further management per critical care/primary team. - GI will follow   Otis Brace MD, [place] 03/14/2018, 11:28 AM  Contact #  [phone]

## 2018-03-14 NOTE — Progress Notes (Signed)
Pt's daughter Jerrye Beavers Rudisil updated on pt's status.

## 2018-03-14 NOTE — Consult Note (Signed)
PULMONARY / CRITICAL CARE MEDICINE   Name: [name] MRN: [id number] DOB: 02-28-1941    ADMISSION DATE:  02/28/2018 CONSULTATION DATE:  03/14/18   REFERRING MD:  [facility] Dr. Grandville Silos   CHIEF COMPLAINT:  Hypotension   HISTORY OF PRESENT ILLNESS:   77 yo female with medical history significant for CAD, PVD, and COPD , previous PE /DVT on anticoagulation until 01/2017 when it was d/c due to small bowel AVM . Admitted 02/28/18 with multiple subacute PE and large  Left DVT . Also noted severe stenosis of superior mesenteric artery . She was started on Heparin Drip . Seen by vascular surgery and underwent stent to SMA on 3/18 . Since SMA stent she has had ongoing abdominal pain and nausea and vomiting . CT abd/pelvis on 3/23 showed new large anterior abdominal wall hematoma (16.9x 11.9) , small vol ascites and moderate right pleural effusion . Early am 3/24 pt with hypotension and notable decline in H/H. Rapid response was called and patient was transported to ICU . PCCM was consulted .  On arrival to ICU, pt is alert and following commands. She complains of abdominal pain and nausea. She has received 500 cc IV bolus. B/p slight improvement 106/47. Heparin IV has been stopped. She has received 1 u PRBC .  Marland Kitchen  PAST MEDICAL HISTORY :  She  has a past medical history of Anemia, Anxiety, Arthralgia, Blood transfusion (2011), CAD (coronary artery disease), Carotid artery occlusion, Chronic diastolic heart failure ([place]), COPD (chronic obstructive pulmonary disease) ([place]), Depression, DVT of lower extremity (deep venous thrombosis) ([place]), Dyspnea, Dysrhythmia, Exertional angina ([place]), GERD (gastroesophageal reflux disease), HLD (hyperlipidemia), Hypertension, Hyperthyroidism, Obesity (BMI 30-39.9) (2009), Osteoarthritis, Pneumonia, PVD (peripheral vascular disease) ([place]), and Sleep apnea (2012).  PAST SURGICAL HISTORY: She  has a past surgical history that includes Ankle surgery (2004); Groin mass open biopsy  (2004); Neck surgery (12/08); Breast enhancement surgery; Total hip arthroplasty (2007); Coronary artery bypass graft (2004); Cardiac catheterization (11/2009); Coronary angioplasty with stent (11/2009); Joint replacement (2012 ); Joint replacement (2010 ); Reverse shoulder arthroplasty (02/26/2012); Eye surgery (2006); Back surgery (1986); Shoulder arthroscopy with subacromial decompression (Left, 02/10/2013); Esophagogastroduodenoscopy (N/A, 02/14/2017); Givens capsule study (N/A, 02/15/2017); Colonoscopy with propofol (N/A, 02/17/2017); VISCERAL ANGIOGRAPHY (N/A, 03/05/2018); PERIPHERAL VASCULAR INTERVENTION (Left, 03/05/2018); VISCERAL ANGIOGRAPHY (N/A, 03/08/2018); PERIPHERAL VASCULAR INTERVENTION (03/08/2018); and IR Paracentesis (03/10/2018).  Allergies  Allergen Reactions  . Zolpidem Tartrate Anxiety    CONFUSION   . Pitavastatin Other (See Comments)    Not sure  . Ropinirole Nausea Only    Makes legs jump  . Penicillins Rash    No current facility-administered medications on file prior to encounter.    Current Outpatient Medications on File Prior to Encounter  Medication Sig  . allopurinol (ZYLOPRIM) 100 MG tablet Take 100 mg by mouth daily.  Marland Kitchen aspirin EC 81 MG tablet Take 81 mg by mouth daily.  . citalopram (CELEXA) 20 MG tablet Take 20 mg by mouth 3 (three) times daily.   . [employer]S D3 1000 units capsule Take 1,000 Units by mouth daily.  . ferrous sulfate 325 (65 FE) MG tablet Take 325 mg by mouth daily with breakfast.  . INCRUSE ELLIPTA 62.5 MCG/INH AEPB Inhale 1 puff into the lungs 2 (two) times daily.   . Ipratropium-Albuterol (COMBIVENT) 20-100 MCG/ACT AERS respimat Inhale 1 puff into the lungs every 6 (six) hours as needed for wheezing.  . isosorbide mononitrate (IMDUR) 120 MG 24 hr tablet Take 120 mg by mouth daily.  Marland Kitchen  nitroGLYCERIN (NITROSTAT) 0.4 MG SL tablet Place 1 tablet (0.4 mg total) under the tongue every 5 (five) minutes as needed for chest pain. For chest pain  . omeprazole  (PRILOSEC) 40 MG capsule Take 40 mg by mouth 2 (two) times daily.   . Oxycodone HCl 10 MG TABS Take 10 mg by mouth every 6 (six) hours as needed for pain.  . pilocarpine (PILOCAR) 1 % ophthalmic solution Place 1 drop into both eyes 2 (two) times daily.  . potassium chloride SA (K-DUR,KLOR-CON) 20 MEQ tablet Take 20 mEq by mouth daily.  . pramipexole (MIRAPEX) 0.25 MG tablet Take 0.25 mg by mouth at bedtime.  . roflumilast (DALIRESP) 500 MCG TABS tablet Take 500 mcg by mouth daily.   . traMADol (ULTRAM) 50 MG tablet Take 150 tablets by mouth at bedtime.   . traZODone (DESYREL) 50 MG tablet Take 150 mg by mouth at bedtime.     FAMILY HISTORY:  Her indicated that her mother is deceased. She indicated that her father is deceased. She indicated that the status of her sister is unknown. She indicated that her maternal grandmother is deceased. She indicated that her maternal grandfather is deceased. She indicated that her paternal grandmother is deceased. She indicated that her paternal grandfather is deceased.   SOCIAL HISTORY: She  reports that she quit smoking about 15 years ago. She quit after 45.00 years of use. She has never used smokeless tobacco. She reports that she does not drink alcohol or use drugs.  REVIEW OF SYSTEMS:   .Constitutional:   No  weight loss, night sweats,  Fevers, chills,  +fatigue, or  lassitude.  HEENT:   No headaches,  Difficulty swallowing,  Tooth/dental problems, or  Sore throat,                No sneezing, itching, ear ache, nasal congestion, post nasal drip,   CV:  No chest pain,  Orthopnea, PND, swelling in lower extremities, anasarca, dizziness, palpitations, syncope.   GI  No heartburn, indigestion, +abdominal pain, nausea, vomiting,  NO diarrhea, change in bowel habits,  +loss of appetite  Resp: +shortness of breath with exertion or at rest.   No excess mucus, no productive cough,  No non-productive cough,  No coughing up of blood.  No change in color of  mucus.  No wheezing.  No chest wall deformity  Skin: no rash or lesions.  GU: no dysuria, change in color of urine, no urgency or frequency.  No flank pain, no hematuria   MS:  No joint pain or swelling.  No decreased range of motion.  No back pain.  Psych:  No change in mood or affect. No depression or anxiety.  No memory loss.       SUBJECTIVE:  +nausea   VITAL SIGNS: BP (!) 88/38 Comment: post bolus  Pulse 97   Temp 98.6 F (37 C) (Oral)   Resp 18   Ht 5\' 8"  (1.727 m)   Wt 177 lb 12.8 oz (80.6 kg) Comment: scale a  SpO2 98%   BMI 27.03 kg/m   HEMODYNAMICS:    VENTILATOR SETTINGS:    INTAKE / OUTPUT: I/O last 3 completed shifts: In: 0240 [P.O.:1610; I.V.:2351; IV Piggyback:800] Out: 2150 [Urine:2150]  PHYSICAL EXAMINATION: General:  Elderly ill appearing female  Neuro:  A/O x 3 , MAE HEENT:  AT/[place] , dry mucosa  Cardiovascular: ST , RRR , Lungs: CTA bilaterally  Abdomen:  Soft, gen tenderness, BS +  Musculoskeletal:  Intact  Skin:  Intact , no rash , pale   LABS:  BMET Recent Labs  Lab 03/12/18 1449 03/13/18 0332 03/13/18 1329  NA 138 136 134*  K 3.1* 3.6 4.2  CL 110 109 107  CO2 21* 20* 17*  BUN 6 6 11   CREATININE 0.60 0.68 1.37*  GLUCOSE 106* 119* 172*    Electrolytes Recent Labs  Lab 03/08/18 0332  03/12/18 0509 03/12/18 1449 03/13/18 0332 03/13/18 1329  CALCIUM 9.1   < > 8.1* 8.0* 7.7* 7.8*  MG 2.0  --  1.6*  --  2.1  --    < > = values in this interval not displayed.    CBC Recent Labs  Lab 03/13/18 0332 03/13/18 1329 03/13/18 1727  WBC 10.4 19.8* 21.5*  HGB 8.6* 7.4* 7.9*  HCT 26.5* 22.7* 25.8*  PLT 215 279 227    Coag's No results for input(s): APTT, INR in the last 168 hours.  Sepsis Markers No results for input(s): LATICACIDVEN, PROCALCITON, O2SATVEN in the last 168 hours.  ABG Recent Labs  Lab 03/13/18 1300 03/14/18 0127  PHART 7.355 7.374  PCO2ART 32.5 29.2*  PO2ART 88.7 139*    Liver  Enzymes Recent Labs  Lab 03/11/18 0337  AST 21  ALT 19  ALKPHOS 61  BILITOT 0.6  ALBUMIN 2.6*    Cardiac Enzymes Recent Labs  Lab 03/13/18 1329 03/13/18 1709  TROPONINI <0.03 <0.03    Glucose No results for input(s): GLUCAP in the last 168 hours.  Imaging Ct Abdomen Pelvis Wo Contrast  Result Date: 03/14/2018 CLINICAL DATA:  Abdominal pain and fever EXAM: CT ABDOMEN AND PELVIS WITHOUT CONTRAST TECHNIQUE: Multidetector CT imaging of the abdomen and pelvis was performed following the standard protocol without IV contrast. COMPARISON:  CT abdomen pelvis 03/09/2018 FINDINGS: Examination is degraded by streak artifact from external patient devices and from the arms down positioning of the patient, creating multiple streak artifacts and decreasing sensitivity and specificity of this scan. Lower chest: Right pleural effusion and associated atelectasis. Hepatobiliary: Right upper quadrant ascites. Nodular appearance of the liver. No focal liver lesion. Status post cholecystectomy. Pancreas: Atrophic appearance of the pancreas. Spleen: Normal. Adrenals/Urinary Tract: --Adrenal glands: Normal. --Right kidney/ureter: No hydronephrosis, perinephric stranding or nephrolithiasis. No obstructing ureteral stones. --Left kidney/ureter: No hydronephrosis, perinephric stranding or nephrolithiasis. No obstructing ureteral stones. --Urinary bladder: Normal appearance for the degree of distention. Stomach/Bowel: --Stomach/Duodenum: No hiatal hernia or other gastric abnormality. Normal duodenal course. --Small bowel: No dilatation or inflammation. --Colon: Diffuse colonic diverticulosis without acute inflammation. --Appendix: Normal. Vascular/Lymphatic: Atherosclerotic calcification is present within the non-aneurysmal abdominal aorta, without hemodynamically significant stenosis. No abdominal or pelvic lymphadenopathy. Reproductive: Pelvis largely obscured by streak artifact. Musculoskeletal. Right total hip  arthroplasty. L1-L3 posterior spinal fusion. Other: Large anterior abdominal wall hematoma measuring up to 16.9 x 11.9 x 12.4 cm. This is new compared to the recent study from 03/09/2018. This exerts mass effect on the intra-abdominal contents. IMPRESSION: 1. New, large anterior abdominal wall hematoma measuring 16.9 x 11.9 x 12.4 cm. 2. Small volume ascites with nodular appearance of the liver concerning for cirrhosis. 3. Medium-sized right pleural effusion with associated atelectasis. 4. Diffuse colonic diverticulosis without acute inflammation. 5.  Aortic Atherosclerosis (ICD10-I70.0). Electronically Signed   By: Ulyses Jarred M.D.   On: 03/14/2018 03:45   Dg Chest Port 1 View  Result Date: 03/13/2018 CLINICAL DATA:  77 year old with shortness of breath. EXAM: PORTABLE CHEST 1 VIEW COMPARISON:  Chest CT 02/28/2018 and chest radiograph 11/08/2017  FINDINGS: Bilateral breast implants. No focal airspace disease or pulmonary edema. Right shoulder replacement. Atherosclerotic calcifications at the aortic arch. Prior CABG procedure. Sclerosis and degenerative changes at the left shoulder. Surgical hardware in the lumbar spine. Heart size is within normal limits. IMPRESSION: No acute chest findings. Electronically Signed   By: Markus Daft M.D.   On: 03/13/2018 12:52     STUDIES:  CT ABD Jerene Pitch 3/19>Interval development of moderate abdominopelvic ascites , Colonic wall thickening involving the cecum, ascending, proximal transverse colon CT abd /pelvis 3/24>>New, large anterior abdominal wall hematoma measuring 16.9 x 11.9x 12.4 cm.2. Small volume ascites with nodular appearance of the liver concerning for cirrhosis.. Medium-sized right pleural effusion  CULTURES: 3/20 Peritoneal fluid >neg  3/24 BC x 2   ANTIBIOTICS: Cipro 3/23 >> Flagyl 3/23 >>   SIGNIFICANT EVENTS: Admit 3/10 PE/DVT  3/18 SMA stenosis s/p stent  3/20 moderate ascites on CT ABD , s/p paracentesis with 100cc removed.  3/24  Hypotension/transfer to ICU , large abd wall hematoma , hep stopped, PRBC x 2 , Platelet x 1    LINES/TUBES:   DISCUSSION: 77 yo female with CAD/PVD and COPD admitted with PE/DVT on 3/10 started on Heparin . CT noted severe SMA stenosis underwent SMA stent on 3/18. Pt with notable decline since stent . Developed hypotension and progressive anemia 3/24. CT abd/pelvis with large abd wall hematoma. Heparin stopped. Transfused PRBC/Platelets and started on pressor support .   ASSESSMENT / PLAN:  PULMONARY A: COPD  Right pleural effusion  PE   P:   Change INCRUSE to Duoneb Four times a day   O2 to keep sats >90%.  Check cxr in am   CARDIOVASCULAR A:  Hypotension secondary to Hemorrhage  CAD   P:  S/p IVF bolus 500cc x 3/24  Begin Neo-Synephrine , titrate for MAP goal 65  Trend lactate   RENAL A:   Mild AKI , scr tr up  Bladder scan with 800cc urine   P:   Avoid nephotoxic meds  Strict I/O  Replace foley  Tr bmet  D/c allopurinol   GASTROINTESTINAL A:   Ascites s/p SMA stent 3/19 s/p paracentesis (cx neg )  Colitis involving cecum on CT ? Ischemic /reperfusion injury vs infectious  Large Abd wall hematoma  P:   Continue on IV abx  Anti-emetic rx  NPO     HEMATOLOGIC A:   PE/DVT , recurrent ( previous AVM bleeding in past and now with large abdominal wall bleed/hematoma )  SMA stenosis s/p stent 3/18   P:  Remain off Heparin  May need to consider IVC filter  D/c plavix  Transfuse 2 u PRBC 3/24  Transfuse Platelet x 1   INFECTIOUS A:   Leukocytosis ?Colitis  P:   Cont IV Cipro /Flagyl  Tr wbc and temp  Check BC   ENDOCRINE A:   No acute issues  P:   Trend BS on chem , if elevated add SSI   NEUROLOGIC A:   No acute issues  P:   Monitor   FAMILY  - Updates: no family at bedside   - Inter-disciplinary family meet or Palliative Care meeting due by: 3/30    Billye Pickerel NP-C  Pulmonary and [place] Pager: [phone]  03/14/2018, 4:03 AM

## 2018-03-14 NOTE — Significant Event (Addendum)
Rapid Response Event Note  Overview: Time Called: 0200 Arrival Time: 0220 Event Type: Hypotension, Respiratory  Initial Focused Assessment: [name] was easily awakened lying in the bed.  Oriented x4, NAD however states she is SOB with sats 97% on 4L [place].  HR 94 SR with 1st HB, BP 88/38 and she is pale and dry. BBS clear and diminished in the bases.  Abd pain is 8 or 9 out of 10. NS bolus infusing as well as Heparin gtt d/t PE.   Interventions: -NS bolus as previously ordered -Recommend 2nd unit of PRBC as ordered by Dr. Grandville Silos -Repeat CBC after PRBC -recommend tx to Stepdown status  Plan of Care (if not transferred): -repeat Abd CT as ordered  Event Summary: Name of Physician Notified: (Multiple calls to primary svc pta)    Outcome: Transferred (Comment)    [name]

## 2018-03-14 NOTE — Progress Notes (Signed)
[facility] [place]  Subjective  - feels bad   Objective (!) 129/52 95 98.3 F (36.8 C) (Oral) (!) 24 96%  Intake/Output Summary (Last 24 hours) at 03/14/2018 0919 Last data filed at 03/14/2018 0730 Gross per 24 hour  Intake 1279.76 ml  Output 50 ml  Net 1229.76 ml   Abdomen diffusely sore  Assessment/Planning: S/p SMA stent.  Decompensation yesterday most likely secondary to rectus sheath hematoma  Discussed with critical care team.  Will stop all anticoagulation heparin asa plavix for now.    She is having IVC filter placed by IR due to contraindication to anticoagulation with known acute DVT  Will try to add back aspirin +/- plavix in the next few days  Ruta Hinds 03/14/2018 9:19 AM --  Laboratory Lab Results: Recent Labs    03/13/18 1727 03/14/18 0402  WBC 21.5* 29.5*  HGB 7.9* 6.6*  HCT 25.8* 20.9*  PLT 227 225   BMET Recent Labs    03/13/18 1329 03/14/18 0402  NA 134* 136  K 4.2 4.9  CL 107 111  CO2 17* 16*  GLUCOSE 172* 169*  BUN 11 20  CREATININE 1.37* 2.16*  CALCIUM 7.8* 7.6*    COAG Lab Results  Component Value Date   INR 1.16 02/13/2017   INR 1.45 04/26/2014   INR 0.97 02/10/2013   No results found for: PTT

## 2018-03-14 NOTE — Progress Notes (Signed)
Pt going for CT scan and will be transferred to 2M14,Report given to Karene Fry RN.

## 2018-03-14 NOTE — Progress Notes (Signed)
Read Drivers rapid response nurse came to evaluate the patient.

## 2018-03-15 ENCOUNTER — Inpatient Hospital Stay (HOSPITAL_COMMUNITY): Payer: Medicare HMO

## 2018-03-15 DIAGNOSIS — R0603 Acute respiratory distress: Secondary | ICD-10-CM

## 2018-03-15 DIAGNOSIS — J9621 Acute and chronic respiratory failure with hypoxia: Secondary | ICD-10-CM

## 2018-03-15 DIAGNOSIS — R935 Abnormal findings on diagnostic imaging of other abdominal regions, including retroperitoneum: Secondary | ICD-10-CM

## 2018-03-15 DIAGNOSIS — N179 Acute kidney failure, unspecified: Secondary | ICD-10-CM | POA: Diagnosis not present

## 2018-03-15 DIAGNOSIS — R188 Other ascites: Secondary | ICD-10-CM

## 2018-03-15 LAB — PREPARE PLATELET PHERESIS: Unit division: 0

## 2018-03-15 LAB — CBC WITH DIFFERENTIAL/PLATELET
Band Neutrophils: 13 %
Band Neutrophils: 0 %
Basophils Absolute: 0 10*3/uL (ref 0.0–0.1)
Basophils Absolute: 0 10*3/uL (ref 0.0–0.1)
Basophils Absolute: 0 10*3/uL (ref 0.0–0.1)
Basophils Absolute: 0.3 10*3/uL — ABNORMAL HIGH (ref 0.0–0.1)
Basophils Absolute: 0.3 10*3/uL — ABNORMAL HIGH (ref 0.0–0.1)
Basophils Relative: 0 %
Basophils Relative: 0 %
Basophils Relative: 0 %
Basophils Relative: 1 %
Basophils Relative: 1 %
Blasts: 0 %
Blasts: 0 %
Eosinophils Absolute: 0 10*3/uL (ref 0.0–0.7)
Eosinophils Absolute: 0 10*3/uL (ref 0.0–0.7)
Eosinophils Absolute: 0 10*3/uL (ref 0.0–0.7)
Eosinophils Absolute: 0 10*3/uL (ref 0.0–0.7)
Eosinophils Absolute: 0 10*3/uL (ref 0.0–0.7)
Eosinophils Relative: 0 %
Eosinophils Relative: 0 %
Eosinophils Relative: 0 %
Eosinophils Relative: 0 %
Eosinophils Relative: 0 %
HCT: 21.2 % — ABNORMAL LOW (ref 36.0–46.0)
HCT: 21.2 % — ABNORMAL LOW (ref 36.0–46.0)
HCT: 27.5 % — ABNORMAL LOW (ref 36.0–46.0)
HCT: 27.7 % — ABNORMAL LOW (ref 36.0–46.0)
HCT: 27.2 % — ABNORMAL LOW (ref 36.0–46.0)
Hemoglobin: 7.2 g/dL — ABNORMAL LOW (ref 12.0–15.0)
Hemoglobin: 7 g/dL — ABNORMAL LOW (ref 12.0–15.0)
Hemoglobin: 9.2 g/dL — ABNORMAL LOW (ref 12.0–15.0)
Hemoglobin: 9.2 g/dL — ABNORMAL LOW (ref 12.0–15.0)
Hemoglobin: 9.2 g/dL — ABNORMAL LOW (ref 12.0–15.0)
Lymphocytes Relative: 5 %
Lymphocytes Relative: 4 %
Lymphocytes Relative: 2 %
Lymphocytes Relative: 8 %
Lymphocytes Relative: 7 %
Lymphs Abs: 1.5 10*3/uL (ref 0.7–4.0)
Lymphs Abs: 1.2 10*3/uL (ref 0.7–4.0)
Lymphs Abs: 0.5 10*3/uL — ABNORMAL LOW (ref 0.7–4.0)
Lymphs Abs: 2.3 10*3/uL (ref 0.7–4.0)
Lymphs Abs: 1.9 10*3/uL (ref 0.7–4.0)
MCH: 28.7 pg (ref 26.0–34.0)
MCH: 28.2 pg (ref 26.0–34.0)
MCH: 28.7 pg (ref 26.0–34.0)
MCH: 28.1 pg (ref 26.0–34.0)
MCH: 28.7 pg (ref 26.0–34.0)
MCHC: 34 g/dL (ref 30.0–36.0)
MCHC: 33 g/dL (ref 30.0–36.0)
MCHC: 33.5 g/dL (ref 30.0–36.0)
MCHC: 33.2 g/dL (ref 30.0–36.0)
MCHC: 33.8 g/dL (ref 30.0–36.0)
MCV: 84.5 fL (ref 78.0–100.0)
MCV: 85.5 fL (ref 78.0–100.0)
MCV: 85.7 fL (ref 78.0–100.0)
MCV: 84.7 fL (ref 78.0–100.0)
MCV: 84.7 fL (ref 78.0–100.0)
Metamyelocytes Relative: 2 %
Metamyelocytes Relative: 0 %
Monocytes Absolute: 4.4 10*3/uL — ABNORMAL HIGH (ref 0.1–1.0)
Monocytes Absolute: 1.5 10*3/uL — ABNORMAL HIGH (ref 0.1–1.0)
Monocytes Absolute: 3 10*3/uL — ABNORMAL HIGH (ref 0.1–1.0)
Monocytes Absolute: 2.5 10*3/uL — ABNORMAL HIGH (ref 0.1–1.0)
Monocytes Absolute: 2.9 10*3/uL — ABNORMAL HIGH (ref 0.1–1.0)
Monocytes Relative: 15 %
Monocytes Relative: 5 %
Monocytes Relative: 11 %
Monocytes Relative: 9 %
Monocytes Relative: 11 %
Myelocytes: 0 %
Myelocytes: 0 %
Neutro Abs: 23.4 10*3/uL — ABNORMAL HIGH (ref 1.7–7.7)
Neutro Abs: 27.7 10*3/uL — ABNORMAL HIGH (ref 1.7–7.7)
Neutro Abs: 23.5 10*3/uL — ABNORMAL HIGH (ref 1.7–7.7)
Neutro Abs: 23.1 10*3/uL — ABNORMAL HIGH (ref 1.7–7.7)
Neutro Abs: 21.5 10*3/uL — ABNORMAL HIGH (ref 1.7–7.7)
Neutrophils Relative %: 80 %
Neutrophils Relative %: 76 %
Neutrophils Relative %: 87 %
Neutrophils Relative %: 82 %
Neutrophils Relative %: 81 %
Other: 0 %
Other: 0 %
Platelets: 241 10*3/uL (ref 150–400)
Platelets: 224 10*3/uL (ref 150–400)
Platelets: 131 10*3/uL — ABNORMAL LOW (ref 150–400)
Platelets: 179 10*3/uL (ref 150–400)
Platelets: 166 10*3/uL (ref 150–400)
Promyelocytes Absolute: 0 %
Promyelocytes Absolute: 0 %
RBC: 2.51 MIL/uL — ABNORMAL LOW (ref 3.87–5.11)
RBC: 2.48 MIL/uL — ABNORMAL LOW (ref 3.87–5.11)
RBC: 3.21 MIL/uL — ABNORMAL LOW (ref 3.87–5.11)
RBC: 3.27 MIL/uL — ABNORMAL LOW (ref 3.87–5.11)
RBC: 3.21 MIL/uL — ABNORMAL LOW (ref 3.87–5.11)
RDW: 17.5 % — ABNORMAL HIGH (ref 11.5–15.5)
RDW: 18 % — ABNORMAL HIGH (ref 11.5–15.5)
RDW: 16.5 % — ABNORMAL HIGH (ref 11.5–15.5)
RDW: 16.5 % — ABNORMAL HIGH (ref 11.5–15.5)
RDW: 16.7 % — ABNORMAL HIGH (ref 11.5–15.5)
WBC: 29.3 10*3/uL — ABNORMAL HIGH (ref 4.0–10.5)
WBC: 30.4 10*3/uL — ABNORMAL HIGH (ref 4.0–10.5)
WBC: 27 10*3/uL — ABNORMAL HIGH (ref 4.0–10.5)
WBC: 28.2 10*3/uL — ABNORMAL HIGH (ref 4.0–10.5)
WBC: 26.6 10*3/uL — ABNORMAL HIGH (ref 4.0–10.5)
nRBC: 4 /100 WBC — ABNORMAL HIGH
nRBC: 8 /100 WBC — ABNORMAL HIGH

## 2018-03-15 LAB — PHOSPHORUS: Phosphorus: 5.3 mg/dL — ABNORMAL HIGH (ref 2.5–4.6)

## 2018-03-15 LAB — URINALYSIS, ROUTINE W REFLEX MICROSCOPIC
Bilirubin Urine: NEGATIVE
Glucose, UA: NEGATIVE mg/dL
Ketones, ur: NEGATIVE mg/dL
Nitrite: NEGATIVE
Protein, ur: 100 mg/dL — AB
Specific Gravity, Urine: 1.02 (ref 1.005–1.030)
pH: 5 (ref 5.0–8.0)

## 2018-03-15 LAB — LACTIC ACID, PLASMA: Lactic Acid, Venous: 1.5 mmol/L (ref 0.5–1.9)

## 2018-03-15 LAB — BASIC METABOLIC PANEL
Anion gap: 9 (ref 5–15)
Anion gap: 9 (ref 5–15)
BUN: 41 mg/dL — ABNORMAL HIGH (ref 6–20)
BUN: 47 mg/dL — ABNORMAL HIGH (ref 6–20)
CO2: 15 mmol/L — ABNORMAL LOW (ref 22–32)
CO2: 16 mmol/L — ABNORMAL LOW (ref 22–32)
Calcium: 7.5 mg/dL — ABNORMAL LOW (ref 8.9–10.3)
Calcium: 7.6 mg/dL — ABNORMAL LOW (ref 8.9–10.3)
Chloride: 112 mmol/L — ABNORMAL HIGH (ref 101–111)
Chloride: 110 mmol/L (ref 101–111)
Creatinine, Ser: 3.19 mg/dL — ABNORMAL HIGH (ref 0.44–1.00)
Creatinine, Ser: 3.22 mg/dL — ABNORMAL HIGH (ref 0.44–1.00)
GFR calc Af Amer: 15 mL/min — ABNORMAL LOW (ref >60)
GFR calc Af Amer: 15 mL/min — ABNORMAL LOW (ref >60)
GFR calc non Af Amer: 13 mL/min — ABNORMAL LOW (ref >60)
GFR calc non Af Amer: 13 mL/min — ABNORMAL LOW (ref >60)
Glucose, Bld: 163 mg/dL — ABNORMAL HIGH (ref 65–99)
Glucose, Bld: 160 mg/dL — ABNORMAL HIGH (ref 65–99)
Potassium: 4.3 mmol/L (ref 3.5–5.1)
Potassium: 4 mmol/L (ref 3.5–5.1)
Sodium: 136 mmol/L (ref 135–145)
Sodium: 135 mmol/L (ref 135–145)

## 2018-03-15 LAB — SODIUM, URINE, RANDOM: Sodium, Ur: 16 mmol/L

## 2018-03-15 LAB — CREATININE, URINE, RANDOM: Creatinine, Urine: 189.08 mg/dL

## 2018-03-15 LAB — CULTURE, BODY FLUID-BOTTLE: Culture: NO GROWTH

## 2018-03-15 LAB — CULTURE, BODY FLUID W GRAM STAIN -BOTTLE

## 2018-03-15 LAB — PREPARE RBC (CROSSMATCH)

## 2018-03-15 LAB — BPAM PLATELET PHERESIS
Blood Product Expiration Date: 201903242359
ISSUE DATE / TIME: 201903240640
Unit Type and Rh: 6200

## 2018-03-15 LAB — PROTIME-INR
INR: 1.49
Prothrombin Time: 17.9 seconds — ABNORMAL HIGH (ref 11.4–15.2)

## 2018-03-15 LAB — MAGNESIUM: Magnesium: 2.1 mg/dL (ref 1.7–2.4)

## 2018-03-15 LAB — APTT: aPTT: 25 seconds (ref 24–36)

## 2018-03-15 MED ORDER — HYDROMORPHONE HCL 1 MG/ML IJ SOLN
0.5000 mg | INTRAMUSCULAR | Status: DC | PRN
  Administered 2018-03-15: 0.5 mg via INTRAVENOUS
  Administered 2018-03-16 (×2): 0.25 mg via INTRAVENOUS
  Administered 2018-03-17 – 2018-03-25 (×32): 0.5 mg via INTRAVENOUS
  Filled 2018-03-15 (×35): qty 0.5

## 2018-03-15 MED ORDER — LACTATED RINGERS IV SOLN
INTRAVENOUS | Status: DC
  Administered 2018-03-15 – 2018-03-16 (×2): via INTRAVENOUS

## 2018-03-15 MED ORDER — ONDANSETRON HCL 4 MG/2ML IJ SOLN
4.0000 mg | Freq: Four times a day (QID) | INTRAMUSCULAR | Status: DC | PRN
  Filled 2018-03-15 (×3): qty 2

## 2018-03-15 MED ORDER — CIPROFLOXACIN IN D5W 200 MG/100ML IV SOLN
200.0000 mg | INTRAVENOUS | Status: DC
  Administered 2018-03-16 – 2018-03-17 (×2): 200 mg via INTRAVENOUS
  Filled 2018-03-15 (×2): qty 100

## 2018-03-15 MED ORDER — SODIUM CHLORIDE 0.9 % IV SOLN: Freq: Once | INTRAVENOUS | Status: DC

## 2018-03-15 MED ORDER — CITALOPRAM HYDROBROMIDE 20 MG PO TABS
30.0000 mg | ORAL_TABLET | Freq: Every day | ORAL | Status: DC
  Administered 2018-03-15 – 2018-03-26 (×12): 30 mg via ORAL
  Filled 2018-03-15 (×12): qty 1

## 2018-03-15 MED ORDER — DIPHENHYDRAMINE HCL 25 MG PO CAPS
25.0000 mg | ORAL_CAPSULE | Freq: Once | ORAL | Status: AC
  Administered 2018-03-15: 25 mg via ORAL
  Filled 2018-03-15: qty 1

## 2018-03-15 MED ORDER — ACETAMINOPHEN 325 MG PO TABS
650.0000 mg | ORAL_TABLET | Freq: Once | ORAL | Status: AC
  Administered 2018-03-15: 650 mg via ORAL
  Filled 2018-03-15: qty 2

## 2018-03-15 MED ORDER — CITALOPRAM HYDROBROMIDE 20 MG PO TABS: 30.0000 mg | ORAL_TABLET | Freq: Every day | ORAL | Status: DC

## 2018-03-15 MED ORDER — LACTATED RINGERS IV SOLN
INTRAVENOUS | Status: DC
  Administered 2018-03-15: 75 mL/h via INTRAVENOUS

## 2018-03-15 MED ORDER — SODIUM CHLORIDE 0.9 % IV BOLUS (SEPSIS)
1000.0000 mL | Freq: Once | INTRAVENOUS | Status: AC
  Administered 2018-03-15: 1000 mL via INTRAVENOUS

## 2018-03-15 NOTE — Progress Notes (Addendum)
  Progress Note    03/15/2018 12:07 PM 7 Days Post-Op  Subjective:  Sleeping soundly  Afebrile   Vitals:   03/15/18 1145 03/15/18 1200  BP: (!) 124/57 112/60  Pulse: (!) 105 (!) 104  Resp: 20 (!) 21  Temp:    SpO2: 94% 96%    Physical Exam: Abdomen:  Soft; mild tenderness to palpation but not fully awake  CBC    Component Value Date/Time   WBC 30.4 (H) 03/15/2018 0534   RBC 2.48 (L) 03/15/2018 0534   HGB 7.0 (L) 03/15/2018 0534   HGB 12.9 08/17/2017 1038   HCT 21.2 (L) 03/15/2018 0534   HCT 38.2 08/17/2017 1038   PLT 224 03/15/2018 0534   PLT 237 08/17/2017 1038   MCV 85.5 03/15/2018 0534   MCV 88 08/17/2017 1038   MCH 28.2 03/15/2018 0534   MCHC 33.0 03/15/2018 0534   RDW 18.0 (H) 03/15/2018 0534   RDW 15.7 (H) 08/17/2017 1038   LYMPHSABS 1.2 03/15/2018 0534   MONOABS 1.5 (H) 03/15/2018 0534   EOSABS 0.0 03/15/2018 0534   BASOSABS 0.0 03/15/2018 0534    BMET    Component Value Date/Time   NA 136 03/15/2018 0534   NA 144 09/14/2017 0921   K 4.3 03/15/2018 0534   CL 112 (H) 03/15/2018 0534   CO2 15 (L) 03/15/2018 0534   GLUCOSE 163 (H) 03/15/2018 0534   BUN 41 (H) 03/15/2018 0534   BUN 12 09/14/2017 0921   CREATININE 3.19 (H) 03/15/2018 0534   CALCIUM 7.5 (L) 03/15/2018 0534   GFRNONAA 13 (L) 03/15/2018 0534   GFRAA 15 (L) 03/15/2018 0534    INR    Component Value Date/Time   INR 1.16 02/13/2017 1148     Intake/Output Summary (Last 24 hours) at 03/15/2018 1207 Last data filed at 03/15/2018 1105 Gross per 24 hour  Intake 4422.38 ml  Output 125 ml  Net 4297.38 ml     Assessment:  77 y.o. female is s/p:  Abdominal aortogram selective superior mesenteric angiogram attempted superior mesenteric artery stent 7 Days Post-Op   Plan: -pt continues with abdominal pain and nausea.   -acute blood loss anemia-received a platelet phoresis pack yesterday and receiving a total of 4 units today and yesterday combined.  Hgb continued to drift down  after receiving PRBC's yesterday -hypotension-neo weaned off -DVT prophylaxis:  None at this time-had IVC filter placed a couple of days ago. -AKI-creatinine drifting upward and urine output dropping off per RN; may need renal consult    Leontine Locket, PA-C [facility] [phone] 03/15/2018 12:07 PM  Oliguric this afternoon most likely secondary to ATN IVC filter placed yesterday Would leave off all anticoagulation and continue volume/blood resuscitation.  No real role for surgical intervention for rectus sheath hematoma secondary to anticoagulation Daughter updated Doubt ongoing intraabdominal bleeding as hemoglobin is now rising  Ruta Hinds, MD [facility] [place]: [phone] Pager: [phone]

## 2018-03-15 NOTE — Progress Notes (Signed)
[facility] Progress Note  [name] 77 y.o. November 09, 1941  CC:  Nausea, vomiting, abdominal pain, colitis   Subjective:  Patient continues to have a abdominal pain and nausea. Remains somewhat distressed. Discussed with the nursing staff. Patient has received 3 units of blood transfusion in last few days and hemoglobin still remains low at 7. Patient remains tachycardic.   ROS : positive for shortness of breath. Positive for weakness   Objective: Vital signs in last 24 hours: Vitals:   03/15/18 0800 03/15/18 0844  BP: (!) 116/49   Pulse: (!) 104   Resp: 18   Temp:  97.9 F (36.6 C)  SpO2: 95%     Physical Exam:  General. Alert and oriented 3. In mild distress. Oral mucosa dry. Heart. Rate rhythm regular. Tachycardia Abdomen - abdomen is distended with generalized tenderness to palpation. Bowel sounds are present. Small area of bruise noted at suprapubic/right inguinal area. Lower extremity. No significant edema but right lower extremity cool to touch. Pulses present but weak.   Lab Results: Recent Labs    03/13/18 0332  03/14/18 0402 03/15/18 0534  NA 136   < > 136 136  K 3.6   < > 4.9 4.3  CL 109   < > 111 112*  CO2 20*   < > 16* 15*  GLUCOSE 119*   < > 169* 163*  BUN 6   < > 20 41*  CREATININE 0.68   < > 2.16* 3.19*  CALCIUM 7.7*   < > 7.6* 7.5*  MG 2.1  --   --  2.1  PHOS  --   --   --  5.3*   < > = values in this interval not displayed.   No results for input(s): AST, ALT, ALKPHOS, BILITOT, PROT, ALBUMIN in the last 72 hours. Recent Labs    03/15/18 0027 03/15/18 0534  WBC 29.3* 30.4*  NEUTROABS 23.4* PENDING  HGB 7.2* 7.0*  HCT 21.2* 21.2*  MCV 84.5 85.5  PLT 241 224   No results for input(s): LABPROT, INR in the last 72 hours.    Assessment/Plan: - anemia probably from large abdominal wall /rectal sheet hematoma - right-sided colitis. Most likely ischemia/reperfusion injury after placement of SMA stent. - abdominal pain and  distention. Most likely from a large hematoma. - Leukocytosis - respiratory failure - acute kidney injury. - New-onset ascites. Paracentesis on 03/10/2018 showed SAAG >1.1 - CT scan showing nodular liver concerning for cirrhosis. - DVT S/P IVC filter placement yesterday. Aspirin and Plavix currently on hold. Hepatin discontinued.    Recommendations ------------------------ Discussed with the nursing staff. Patient has received 3 units of blood transfusion in last few days and hemoglobin still remains low at 7. Patient remains tachycardic.ongoing internal bleeding cannot be ruled out.I have paged Vascular surgery for further discussion. They may need to come by and see the patient again  - ok  to slowly advance diet from GI standpoint. - GI will follow   Otis Brace MD, FACP 03/15/2018, 8:55 AM  Contact #  [phone]

## 2018-03-15 NOTE — Progress Notes (Signed)
[facility] PCCM  Oliguric despite fluids Foley in place  Continue IVF  Check renal ultrasound  Roselie Awkward, MD [place] [facility] Pager: [phone] Cell: [phone] After 3pm or if no response, call [phone]

## 2018-03-15 NOTE — Progress Notes (Addendum)
eLink Physician-Brief Progress Note Patient Name: [name] DOB: 1941/04/03 MRN: [id number]   Date of Service  03/15/2018  HPI/Events of Note  Oliguria - No CVL. Last LVEF = 55% to 60%. Hgb = 7.2.  eICU Interventions  Will bolus with 0.9 NaCl 1 liter IV over 1 hour now.      Intervention Category Intermediate Interventions: Oliguria - evaluation and management  Robbye Dede Eugene 03/15/2018, 3:24 AM

## 2018-03-15 NOTE — Progress Notes (Signed)
PULMONARY / CRITICAL CARE MEDICINE   Name: [name] MRN: [id number] DOB: [birth year]    ADMISSION DATE:  02/28/2018 CONSULTATION DATE:  03/14/2018  REFERRING MD:  Dr. Grandville Silos  CHIEF COMPLAINT:  Hypotension  HISTORY OF PRESENT ILLNESS:   77 y/o female with CAD, COPD, prior PE admitted on 3/10 with PE and DVT, noted to have severe stenosis of SMA.  Had stenting of SMA on 3/18.  Noted to have abdominal pain, nausea, vomiting.  On 3/23 noted to have a large abdominal wall hematoma.    PAST MEDICAL HISTORY :  She  has a past medical history of Anemia, Anxiety, Arthralgia, Blood transfusion (2011), CAD (coronary artery disease), Carotid artery occlusion, Chronic diastolic heart failure ([place]), COPD (chronic obstructive pulmonary disease) ([place]), Depression, DVT of lower extremity (deep venous thrombosis) ([place]), Dyspnea, Dysrhythmia, Exertional angina ([place]), GERD (gastroesophageal reflux disease), HLD (hyperlipidemia), Hypertension, Hyperthyroidism, Obesity (BMI 30-39.9) (2009), Osteoarthritis, Pneumonia, PVD (peripheral vascular disease) ([place]), and Sleep apnea (2012).    SUBJECTIVE:  Abdominal pain nausea  VITAL SIGNS: BP (!) 116/49 (BP Location: Left Arm)   Pulse (!) 104   Temp 97.9 F (36.6 C) (Oral)   Resp 18   Ht 5\' 8"  (1.727 m)   Wt 201 lb 4.5 oz (91.3 kg)   SpO2 95%   BMI 30.60 kg/m   HEMODYNAMICS:    VENTILATOR SETTINGS:    INTAKE / OUTPUT: I/O last 3 completed shifts: In: 4800.8 [P.O.:120; I.V.:236.8; OACZY:606; IV [id number]:2355] Out: 150 [Urine:150]  PHYSICAL EXAMINATION:  Gen: chronically ill appearing HENT: OP clear, TM's clear, neck supple PULM: CTA B, normal percussion CV: RRR, no mgr, trace edema GI: minimal bowel sounds, some distension Derm: no cyanosis or rash Psyche: normal mood and affect   LABS:  BMET Recent Labs  Lab 03/13/18 1329 03/14/18 0402 03/15/18 0534  NA 134* 136 136  K 4.2 4.9 4.3  CL 107 111 112*  CO2 17* 16* 15*  BUN 11  20 41*  CREATININE 1.37* 2.16* 3.19*  GLUCOSE 172* 169* 163*    Electrolytes Recent Labs  Lab 03/12/18 0509  03/13/18 0332 03/13/18 1329 03/14/18 0402 03/15/18 0534  CALCIUM 8.1*   < > 7.7* 7.8* 7.6* 7.5*  MG 1.6*  --  2.1  --   --  2.1  PHOS  --   --   --   --   --  5.3*   < > = values in this interval not displayed.    CBC Recent Labs  Lab 03/14/18 1902 03/15/18 0027 03/15/18 0534  WBC 31.1* 29.3* 30.4*  HGB 7.8* 7.2* 7.0*  HCT 22.8* 21.2* 21.2*  PLT 260 241 224    Coag's No results for input(s): APTT, INR in the last 168 hours.  Sepsis Markers Recent Labs  Lab 03/14/18 1103  LATICACIDVEN 4.7*    ABG Recent Labs  Lab 03/13/18 1300 03/14/18 0127  PHART 7.355 7.374  PCO2ART 32.5 29.2*  PO2ART 88.7 139*    Liver Enzymes Recent Labs  Lab 03/11/18 0337  AST 21  ALT 19  ALKPHOS 61  BILITOT 0.6  ALBUMIN 2.6*    Cardiac Enzymes Recent Labs  Lab 03/13/18 1329 03/13/18 1709 03/14/18 1103  TROPONINI <0.03 <0.03 0.04*    Glucose Recent Labs  Lab 03/14/18 0324  GLUCAP 177*    Imaging Ir Ivc Filter Plmt / S&i /img Guid/mod Sed  Result Date: 03/14/2018 INDICATION: 77 year old with history of pulmonary embolism and left lower extremity DVT. Patient now has  a large abdominal wall hematoma and no longer candidate for anticoagulation. EXAM: IVC FILTER PLACEMENT; IVC VENOGRAM; ULTRASOUND FOR VASCULAR ACCESS Physician: Stephan Minister. Anselm Pancoast, MD MEDICATIONS: None. ANESTHESIA/SEDATION: Fentanyl 25 mcg IV; Versed 0.5 mg IV Moderate Sedation Time:  22 minutes The patient was continuously monitored during the procedure by the interventional radiology nurse under my direct supervision. CONTRAST:  Carbon dioxide FLUOROSCOPY TIME:  Fluoroscopy Time: 7 minutes 6 seconds (327 mGy). COMPLICATIONS: None immediate. PROCEDURE: Informed consent was obtained for an IVC venogram and filter placement. Ultrasound demonstrated a patent right internal jugular vein. Ultrasound images  were obtained for documentation. The right side of the neck was prepped and draped in a sterile fashion. Maximal barrier sterile technique was utilized including caps, mask, sterile gowns, sterile gloves, sterile drape, hand hygiene and skin antiseptic. The skin was anesthetized with 1% lidocaine. A 21 gauge needle was directed into the vein with ultrasound guidance and a micropuncture dilator set was placed. A wire was advanced into the IVC. The filter sheath was advanced over the wire into the IVC. An IVC venogram was performed with carbon dioxide. Limited evaluation of the IVC due to carbon dioxide and the lumbar surgical hardware. Therefore, a MPA catheter was used to cannulate bilateral renal veins. Renal venography was also performed with carbon dioxide bilaterally. The location of the renal veins were confidently identified. Fluoroscopic images were obtained for documentation. A Bard Denali filter was deployed in the IVC below the lowest renal vein. Follow-up images were obtained to confirm adequate deployment of the filter. Vascular sheath was removed with manual compression. FINDINGS: IVC was patent. Bilateral renal veins were identified. The filter was deployed below the lowest renal vein. IMPRESSION: Successful placement of a retrievable IVC filter. PLAN: This IVC filter is potentially retrievable. The patient will be assessed for filter retrieval by Interventional Radiology in approximately 8-12 weeks. Further recommendations regarding filter retrieval, continued surveillance or declaration of device permanence, will be made at that time. Electronically Signed   By: Markus Daft M.D.   On: 03/14/2018 13:03   Dg Chest Port 1 View  Result Date: 03/15/2018 CLINICAL DATA:  Pleural effusion. EXAM: PORTABLE CHEST 1 VIEW COMPARISON:  Radiograph of March 13, 2018. FINDINGS: The heart size and mediastinal contours are within normal limits. Atherosclerosis of thoracic aorta is noted. Status post coronary artery  bypass graft. No pneumothorax is noted. Increased right basilar opacity is noted concerning for worsening atelectasis or infiltrate with associated pleural effusion. Status post right shoulder arthroplasty. IMPRESSION: Increased right basilar atelectasis or infiltrate is noted with possible associated pleural effusion. Aortic Atherosclerosis (ICD10-I70.0). Electronically Signed   By: Marijo Conception, M.D.   On: 03/15/2018 07:45     STUDIES:  CT ABD Jerene Pitch 3/19>Interval development of moderate abdominopelvic ascites , Colonic wall thickening involving the cecum, ascending, proximal transverse colon CT abd /pelvis 3/24>>New, large anterior abdominal wall hematoma measuring 16.9 x 11.9x 12.4 cm.2. Small volume ascites with nodular appearance of the liver concerning for cirrhosis.. Medium-sized right pleural effusion  CULTURES: 3/20 Peritoneal fluid >neg  3/24 BC x 2   ANTIBIOTICS: Cipro 3/23 >> Flagyl 3/23 >>   SIGNIFICANT EVENTS: Admit 3/10 PE/DVT  3/18 SMA stenosis s/p stent  3/20 moderate ascites on CT ABD , s/p paracentesis with 100cc removed.  3/24 Hypotension/transfer to ICU , large abd wall hematoma , hep stopped, PRBC x 2 , Platelet x 1  3/24 Retrievable IVC filter placed    DISCUSSION: 77 y/o female with a history  of PE and DVT was admitted in the setting of the same, noted to have SMA stenosis which was stented, now has persistent anemia in setting of a large rectus sheath hematoma.    ASSESSMENT / PLAN:  PULMONARY A: PE P:   Monitor respiratory status  CARDIOVASCULAR A:  Hemorrhagic shock Abdominal vascular disease, recent stent CAD Diastolic CHF P:  Wean off neosynephrine Continue PRBC transfusion to maintain Hgb > 7gm/dL Hold ASA/Plavix  RENAL A:   AKI Lactic acidosis P:   Monitor BMET and UOP Replace electrolytes as needed Repeat lactic acid Start LR  GASTROINTESTINAL A:   Ischemic colitis Cirrhosis Ascites SMA stent P:   Discuss with GI  medicine: advance diet? Hold off on paracentesis Consider diuretics if BP improved  HEMATOLOGIC A:   Hemorrhagic anemia DVT s/p IVC filter P:  Repeat coags, cbc this morning Serial CBC Hold all anticoagulation  INFECTIOUS A:   Ischemic colitis P:   Continuing CIPRO/FLAGYL for now, discuss with GI if this is still necessary  ENDOCRINE A:   Hyperglycemia P:   SSI  NEUROLOGIC A:   Abdominal pain P:   Minimize sedating medications Change morphine to dilaudid  FAMILY  - Updates: granddaughter updated bedside 3/35  - Inter-disciplinary family meet or Palliative Care meeting due by:  Day 7  My cc time 35 minutes  Roselie Awkward, MD [facility] Pager: [phone] Cell: [phone] After 3pm or if no response, call [phone]     Pulmonary and [place] Pager: [phone]  03/15/2018, 9:10 AM

## 2018-03-16 ENCOUNTER — Inpatient Hospital Stay (HOSPITAL_COMMUNITY): Payer: Medicare HMO

## 2018-03-16 LAB — CBC WITH DIFFERENTIAL/PLATELET
Band Neutrophils: 0 %
Basophils Absolute: 0.2 10*3/uL — ABNORMAL HIGH (ref 0.0–0.1)
Basophils Absolute: 0.2 10*3/uL — ABNORMAL HIGH (ref 0.0–0.1)
Basophils Absolute: 0.2 10*3/uL — ABNORMAL HIGH (ref 0.0–0.1)
Basophils Absolute: 0.2 10*3/uL — ABNORMAL HIGH (ref 0.0–0.1)
Basophils Relative: 1 %
Basophils Relative: 1 %
Basophils Relative: 1 %
Basophils Relative: 1 %
Blasts: 0 %
Eosinophils Absolute: 0 10*3/uL (ref 0.0–0.7)
Eosinophils Absolute: 0 10*3/uL (ref 0.0–0.7)
Eosinophils Absolute: 0 10*3/uL (ref 0.0–0.7)
Eosinophils Absolute: 0 10*3/uL (ref 0.0–0.7)
Eosinophils Relative: 0 %
Eosinophils Relative: 0 %
Eosinophils Relative: 0 %
Eosinophils Relative: 0 %
HCT: 26.9 % — ABNORMAL LOW (ref 36.0–46.0)
HCT: 26 % — ABNORMAL LOW (ref 36.0–46.0)
HCT: 24.5 % — ABNORMAL LOW (ref 36.0–46.0)
HCT: 23.9 % — ABNORMAL LOW (ref 36.0–46.0)
Hemoglobin: 9 g/dL — ABNORMAL LOW (ref 12.0–15.0)
Hemoglobin: 8.8 g/dL — ABNORMAL LOW (ref 12.0–15.0)
Hemoglobin: 8.3 g/dL — ABNORMAL LOW (ref 12.0–15.0)
Hemoglobin: 7.9 g/dL — ABNORMAL LOW (ref 12.0–15.0)
Lymphocytes Relative: 7 %
Lymphocytes Relative: 6 %
Lymphocytes Relative: 8 %
Lymphocytes Relative: 6 %
Lymphs Abs: 1.7 10*3/uL (ref 0.7–4.0)
Lymphs Abs: 1.3 10*3/uL (ref 0.7–4.0)
Lymphs Abs: 1.5 10*3/uL (ref 0.7–4.0)
Lymphs Abs: 1 10*3/uL (ref 0.7–4.0)
MCH: 28.7 pg (ref 26.0–34.0)
MCH: 28.9 pg (ref 26.0–34.0)
MCH: 29.1 pg (ref 26.0–34.0)
MCH: 28.6 pg (ref 26.0–34.0)
MCHC: 33.5 g/dL (ref 30.0–36.0)
MCHC: 33.8 g/dL (ref 30.0–36.0)
MCHC: 33.9 g/dL (ref 30.0–36.0)
MCHC: 33.1 g/dL (ref 30.0–36.0)
MCV: 85.7 fL (ref 78.0–100.0)
MCV: 85.2 fL (ref 78.0–100.0)
MCV: 86 fL (ref 78.0–100.0)
MCV: 86.6 fL (ref 78.0–100.0)
Metamyelocytes Relative: 0 %
Monocytes Absolute: 2.4 10*3/uL — ABNORMAL HIGH (ref 0.1–1.0)
Monocytes Absolute: 2.4 10*3/uL — ABNORMAL HIGH (ref 0.1–1.0)
Monocytes Absolute: 1.9 10*3/uL — ABNORMAL HIGH (ref 0.1–1.0)
Monocytes Absolute: 2.1 10*3/uL — ABNORMAL HIGH (ref 0.1–1.0)
Monocytes Relative: 10 %
Monocytes Relative: 11 %
Monocytes Relative: 10 %
Monocytes Relative: 12 %
Myelocytes: 0 %
Neutro Abs: 19.9 10*3/uL — ABNORMAL HIGH (ref 1.7–7.7)
Neutro Abs: 17.5 10*3/uL — ABNORMAL HIGH (ref 1.7–7.7)
Neutro Abs: 15.3 10*3/uL — ABNORMAL HIGH (ref 1.7–7.7)
Neutro Abs: 14.1 10*3/uL — ABNORMAL HIGH (ref 1.7–7.7)
Neutrophils Relative %: 82 %
Neutrophils Relative %: 82 %
Neutrophils Relative %: 81 %
Neutrophils Relative %: 81 %
Other: 0 %
Platelets: 160 10*3/uL (ref 150–400)
Platelets: 159 10*3/uL (ref 150–400)
Platelets: 129 10*3/uL — ABNORMAL LOW (ref 150–400)
Platelets: 121 10*3/uL — ABNORMAL LOW (ref 150–400)
Promyelocytes Absolute: 0 %
RBC: 3.14 MIL/uL — ABNORMAL LOW (ref 3.87–5.11)
RBC: 3.05 MIL/uL — ABNORMAL LOW (ref 3.87–5.11)
RBC: 2.85 MIL/uL — ABNORMAL LOW (ref 3.87–5.11)
RBC: 2.76 MIL/uL — ABNORMAL LOW (ref 3.87–5.11)
RDW: 17.2 % — ABNORMAL HIGH (ref 11.5–15.5)
RDW: 16.9 % — ABNORMAL HIGH (ref 11.5–15.5)
RDW: 17.3 % — ABNORMAL HIGH (ref 11.5–15.5)
RDW: 17 % — ABNORMAL HIGH (ref 11.5–15.5)
WBC: 24.2 10*3/uL — ABNORMAL HIGH (ref 4.0–10.5)
WBC: 21.4 10*3/uL — ABNORMAL HIGH (ref 4.0–10.5)
WBC: 18.9 10*3/uL — ABNORMAL HIGH (ref 4.0–10.5)
WBC: 17.4 10*3/uL — ABNORMAL HIGH (ref 4.0–10.5)
nRBC: 0 /100 WBC

## 2018-03-16 LAB — BPAM RBC
Blood Product Expiration Date: 201904162359
Blood Product Expiration Date: 201904162359
Blood Product Expiration Date: 201904162359
Blood Product Expiration Date: 201904232359
Blood Product Expiration Date: 201904232359
ISSUE DATE / TIME: 201903232215
ISSUE DATE / TIME: 201903240405
ISSUE DATE / TIME: 201903240540
ISSUE DATE / TIME: 201903250906
ISSUE DATE / TIME: 201903251042
Unit Type and Rh: 5100
Unit Type and Rh: 5100
Unit Type and Rh: 5100
Unit Type and Rh: 5100
Unit Type and Rh: 5100

## 2018-03-16 LAB — BASIC METABOLIC PANEL
Anion gap: 10 (ref 5–15)
Anion gap: 9 (ref 5–15)
Anion gap: 9 (ref 5–15)
BUN: 52 mg/dL — ABNORMAL HIGH (ref 6–20)
BUN: 54 mg/dL — ABNORMAL HIGH (ref 6–20)
BUN: 56 mg/dL — ABNORMAL HIGH (ref 6–20)
CO2: 16 mmol/L — ABNORMAL LOW (ref 22–32)
CO2: 15 mmol/L — ABNORMAL LOW (ref 22–32)
CO2: 17 mmol/L — ABNORMAL LOW (ref 22–32)
Calcium: 7.8 mg/dL — ABNORMAL LOW (ref 8.9–10.3)
Calcium: 7.7 mg/dL — ABNORMAL LOW (ref 8.9–10.3)
Calcium: 7.6 mg/dL — ABNORMAL LOW (ref 8.9–10.3)
Chloride: 109 mmol/L (ref 101–111)
Chloride: 110 mmol/L (ref 101–111)
Chloride: 109 mmol/L (ref 101–111)
Creatinine, Ser: 3.03 mg/dL — ABNORMAL HIGH (ref 0.44–1.00)
Creatinine, Ser: 2.84 mg/dL — ABNORMAL HIGH (ref 0.44–1.00)
Creatinine, Ser: 2.72 mg/dL — ABNORMAL HIGH (ref 0.44–1.00)
GFR calc Af Amer: 16 mL/min — ABNORMAL LOW (ref >60)
GFR calc Af Amer: 17 mL/min — ABNORMAL LOW (ref >60)
GFR calc Af Amer: 18 mL/min — ABNORMAL LOW (ref >60)
GFR calc non Af Amer: 14 mL/min — ABNORMAL LOW (ref >60)
GFR calc non Af Amer: 15 mL/min — ABNORMAL LOW (ref >60)
GFR calc non Af Amer: 16 mL/min — ABNORMAL LOW (ref >60)
Glucose, Bld: 154 mg/dL — ABNORMAL HIGH (ref 65–99)
Glucose, Bld: 144 mg/dL — ABNORMAL HIGH (ref 65–99)
Glucose, Bld: 153 mg/dL — ABNORMAL HIGH (ref 65–99)
Potassium: 4.1 mmol/L (ref 3.5–5.1)
Potassium: 4.1 mmol/L (ref 3.5–5.1)
Potassium: 4 mmol/L (ref 3.5–5.1)
Sodium: 135 mmol/L (ref 135–145)
Sodium: 134 mmol/L — ABNORMAL LOW (ref 135–145)
Sodium: 135 mmol/L (ref 135–145)

## 2018-03-16 LAB — TYPE AND SCREEN
ABO/RH(D): O POS
Antibody Screen: NEGATIVE
Unit division: 0
Unit division: 0
Unit division: 0
Unit division: 0
Unit division: 0

## 2018-03-16 LAB — CBC
HCT: 25.1 % — ABNORMAL LOW (ref 36.0–46.0)
Hemoglobin: 8.6 g/dL — ABNORMAL LOW (ref 12.0–15.0)
MCH: 29.8 pg (ref 26.0–34.0)
MCHC: 34.3 g/dL (ref 30.0–36.0)
MCV: 86.9 fL (ref 78.0–100.0)
Platelets: 145 10*3/uL — ABNORMAL LOW (ref 150–400)
RBC: 2.89 MIL/uL — ABNORMAL LOW (ref 3.87–5.11)
RDW: 17.3 % — ABNORMAL HIGH (ref 11.5–15.5)
WBC: 20.7 10*3/uL — ABNORMAL HIGH (ref 4.0–10.5)

## 2018-03-16 LAB — PHOSPHORUS: Phosphorus: 5.3 mg/dL — ABNORMAL HIGH (ref 2.5–4.6)

## 2018-03-16 LAB — MAGNESIUM: Magnesium: 2.1 mg/dL (ref 1.7–2.4)

## 2018-03-16 LAB — LIPASE, BLOOD: Lipase: 18 U/L (ref 11–51)

## 2018-03-16 LAB — LACTIC ACID, PLASMA: Lactic Acid, Venous: 1.1 mmol/L (ref 0.5–1.9)

## 2018-03-16 MED ORDER — SODIUM BICARBONATE 8.4 % IV SOLN
INTRAVENOUS | Status: DC
  Administered 2018-03-16 – 2018-03-17 (×2): via INTRAVENOUS
  Filled 2018-03-16 (×4): qty 150

## 2018-03-16 NOTE — Care Management Note (Signed)
Case Management Note  Patient Details  Name: [name] MRN: [id number] Date of Birth: 08/04/1941  Subjective/Objective:      Had stenting of SMA on 3/18.  Noted to have abdominal pain, nausea, vomiting.  On 3/23 noted to have a large abdominal wall hematoma - transferred to ICU  Action/Plan:  PTA from home.  Prior to transfer to ICU pt was recommended for [facility] however CM requested reassessment   Expected Discharge Date:  03/05/18               Expected Discharge Plan:     In-House Referral:  Clinical Social Work  Discharge planning Services  CM Consult  Post Acute Care Choice:    Choice offered to:     DME Arranged:    DME Agency:     HH Arranged:    [place] Agency:     Status of Service:     If discussed at [employer], dates discussed:    Additional Comments:  Maryclare Labrador, RN 03/16/2018, 4:20 PM

## 2018-03-16 NOTE — Progress Notes (Signed)
[facility]  Subjective  - nausea today but feels a little better overall   Objective (!) 97/43 (!) 110 98.2 F (36.8 C) (Oral) 19 94%  Intake/Output Summary (Last 24 hours) at 03/16/2018 1221 Last data filed at 03/16/2018 1100 Gross per 24 hour  Intake 2116.17 ml  Output 435 ml  Net 1681.17 ml   Abdomen tense diffusely tender  Assessment/Planning: SMA stent restart asa when hemoglobin stable Renal failure ATN seems to be improving  Ruta Hinds 03/16/2018 12:21 PM --  Laboratory Lab Results: Recent Labs    03/16/18 0702 03/16/18 0916  WBC 20.7* 21.4*  HGB 8.6* 8.8*  HCT 25.1* 26.0*  PLT 145* 159   BMET Recent Labs    03/16/18 0407 03/16/18 1132  NA 135 134*  K 4.1 4.1  CL 109 110  CO2 16* 15*  GLUCOSE 154* 144*  BUN 52* 54*  CREATININE 3.03* 2.84*  CALCIUM 7.8* 7.7*    COAG Lab Results  Component Value Date   INR 1.49 03/15/2018   INR 1.16 02/13/2017   INR 1.45 04/26/2014   No results found for: PTT

## 2018-03-16 NOTE — Progress Notes (Addendum)
PULMONARY / CRITICAL CARE MEDICINE   Name: [name] MRN: [id number] DOB: 06-Dec-1941    ADMISSION DATE:  02/28/2018 CONSULTATION DATE:  03/14/2018  REFERRING MD:  Dr. Grandville Silos  CHIEF COMPLAINT:  Hypotension  HISTORY OF PRESENT ILLNESS:   77 y/o female with CAD, COPD, prior PE admitted on 3/10 with PE and DVT, noted to have severe stenosis of SMA.  Had stenting of SMA on 3/18.  Noted to have abdominal pain, nausea, vomiting.  On 3/23 noted to have a large abdominal wall hematoma.    PAST MEDICAL HISTORY :  She  has a past medical history of Anemia, Anxiety, Arthralgia, Blood transfusion (2011), CAD (coronary artery disease), Carotid artery occlusion, Chronic diastolic heart failure ([place]), COPD (chronic obstructive pulmonary disease) ([place]), Depression, DVT of lower extremity (deep venous thrombosis) ([place]), Dyspnea, Dysrhythmia, Exertional angina ([place]), GERD (gastroesophageal reflux disease), HLD (hyperlipidemia), Hypertension, Hyperthyroidism, Obesity (BMI 30-39.9) (2009), Osteoarthritis, Pneumonia, PVD (peripheral vascular disease) ([place]), and Sleep apnea (2012).    SUBJECTIVE:  Has significant nausea as well as significant abdominal pressure  VITAL SIGNS: Blood Pressure (Abnormal) 107/51 (BP Location: Left Arm)   Pulse (Abnormal) 104   Temperature 98.2 F (36.8 C) (Oral)   Respiration 18   Height 5\' 8"  (1.727 m)   Weight 206 lb 9.1 oz (93.7 kg)   Oxygen Saturation 93%   Body Mass Index 31.41 kg/m   HEMODYNAMICS:    VENTILATOR SETTINGS:    INTAKE / OUTPUT: I/O last 3 completed shifts: In: 6330.2 [P.O.:90; I.V.:1768.6; Blood:1271.7; IV Piggyback:3200] Out: 35 [Urine:415]  PHYSICAL EXAMINATION:  General: This is a chronically ill-appearing 77 year old female currently appears uncomfortable, but not in acute distress HEENT: Normocephalic atraumatic no jugular venous distention mucous membranes are moist. Pulmonary: Clear to auscultation diminished in the bases no  accessory use Cardiac: Regular rate and rhythm without murmur rub or gallop she remains tachycardic Abdomen: Slightly distended, generalized discomfort to palpation.  Ecchymosis over the right lower quadrant, hypoactive bowel sounds. GU: Concentrated blood-tinged appearing urine Extremities/musculoskeletal: Warm, dry, dependent edema brisk cap refill strong pulses Neuro/psych: Awake oriented no focal motor deficits.   LABS:  BMET Recent Labs  Lab 03/15/18 0534 03/15/18 1450 03/16/18 0407  NA 136 135 135  K 4.3 4.0 4.1  CL 112* 110 109  CO2 15* 16* 16*  BUN 41* 47* 52*  CREATININE 3.19* 3.22* 3.03*  GLUCOSE 163* 160* 154*    Electrolytes Recent Labs  Lab 03/13/18 0332  03/15/18 0534 03/15/18 1450 03/16/18 0407  CALCIUM 7.7*   < > 7.5* 7.6* 7.8*  MG 2.1  --  2.1  --  2.1  PHOS  --   --  5.3*  --  5.3*   < > = values in this interval not displayed.    CBC Recent Labs  Lab 03/15/18 2106 03/16/18 0407 03/16/18 0702  WBC 26.6* 24.2* 20.7*  HGB 9.2* 9.0* 8.6*  HCT 27.2* 26.9* 25.1*  PLT 166 160 145*    Coag's Recent Labs  Lab 03/15/18 1328  APTT 25  INR 1.49    Sepsis Markers Recent Labs  Lab 03/14/18 1103 03/15/18 1328  LATICACIDVEN 4.7* 1.5    ABG Recent Labs  Lab 03/13/18 1300 03/14/18 0127  PHART 7.355 7.374  PCO2ART 32.5 29.2*  PO2ART 88.7 139*    Liver Enzymes Recent Labs  Lab 03/11/18 0337  AST 21  ALT 19  ALKPHOS 61  BILITOT 0.6  ALBUMIN 2.6*    Cardiac Enzymes Recent Labs  Lab 03/13/18 1329 03/13/18 1709 03/14/18 1103  TROPONINI <0.03 <0.03 0.04*    Glucose Recent Labs  Lab 03/14/18 0324  GLUCAP 177*    Imaging US Renal  Result Date: 03/15/2018 CLINICAL DATA:  Acute kidney injury. EXAM: RENAL / URINARY TRACT ULTRASOUND COMPLETE COMPARISON:  CT, 03/14/2018. FINDINGS: Right Kidney: Length: 8.6 cm. Diffuse cortical thinning. Increased renal parenchymal echogenicity. There is a 2.6 x 2.0 x 2.3 cm cyst arising from  the lower pole. No other masses, no stones and no hydronephrosis. Left Kidney: Length: 9.2 cm. Mild diffuse cortical thinning. Borderline increased renal parenchymal echogenicity. No mass, stone or hydronephrosis. Lower pole partly obscured by overlying bowel gas. Bladder: Decompressed with a Foley catheter.  Not well assessed. Patient also has ascites and a right pleural effusion. IMPRESSION: 1. Findings consistent with medical renal disease with renal cortical thinning, greater on the right, and increased renal parenchymal echogenicity on the right and borderline increased renal parenchymal echogenicity on the left. 2. No acute findings.  No hydronephrosis. 3. 2.6 cm right lower pole renal cyst. Electronically Signed   By: Lajean Manes M.D.   On: 03/15/2018 15:45     STUDIES:  CT ABD Jerene Pitch 3/19>Interval development of moderate abdominopelvic ascites , Colonic wall thickening involving the cecum, ascending, proximal transverse colon CT abd /pelvis 3/24>>New, large anterior abdominal wall hematoma measuring 16.9 x 11.9x 12.4 cm.2. Small volume ascites with nodular appearance of the liver concerning for cirrhosis.. Medium-sized right pleural effusion  CULTURES: 3/20 Peritoneal fluid >neg  3/24 BC x 2   ANTIBIOTICS: Cipro 3/23 >> Flagyl 3/23 >>   SIGNIFICANT EVENTS: Admit 3/10 PE/DVT  3/18 SMA stenosis s/p stent  3/20 moderate ascites on CT ABD , s/p paracentesis with 100cc removed.  3/24 Hypotension/transfer to ICU , large abd wall hematoma , hep stopped, PRBC x 2 , Platelet x 1  3/24 Retrievable IVC filter placed    DISCUSSION: 77 y/o female with a history of PE and DVT was admitted in the setting of the same, noted to have SMA stenosis which was stented, now has persistent anemia in setting of a large rectus sheath hematoma.    ASSESSMENT / PLAN: Resolved issues:  Hemorrhagic shock Lactic acidosis   Current issues:   Right sided Ischemic colitis s/p stent placement to SMA  on 3/18 w/ on-going nausea  Plan Cont PRN antiemetics  Repeat lactic acid (if not increased would be re-assuring about ischemia) she has been off plavix since 3/24 ? Stent occlusion ?  Ck lipase (for nausea) Adv diet as able  Discontinue antibiotics 1 view abd r/o ileus   Pulmonary Emboli/ left fem DVT S/p IVC filter 3/24 Plan Wean oxygen Eventually will need to decide on re-challenge of AC  Hemorrhagic anemia w/ rectus sheath hematoma  Hemoglobin appears stable.  Dropped from 9-8.6 over the last 24 hours Plan Trend cbc Holding a/c  AKI w/ NAG metabolic acidosis  -suspect mix of low perfusion and dye load -Creatinine has improved marginally. Plan Add bicarb gtt Serial chemistries Strict I&O Repeat am chem Renal dose meds    New ascites s/p paracentesis 3/20 w SAAG >1.1-->felt inflammatory  CT scan w/ finding worrisome for Cirrhosis  Plan Monitor  Further cirrhosis eval as out-pt   Hyperglycemia Plan ssi    FAMILY  - Updates: granddaughter updated bedside 3/35  - Inter-disciplinary family meet or Palliative Care meeting due by:  Day 7  DVT prophylaxis: IVC filter SUP: ppi  Diet: adv Activity: BR Disposition :  ICU  My cct 50 min  Erick Colace ACNP-BC [place] Pager # [phone] OR # [phone] if no answer  03/16/2018, 10:23 AM

## 2018-03-16 NOTE — Progress Notes (Signed)
[facility] Progress Note  KATORI WIRSING 77 y.o. 12/06/1941  CC:  Nausea, vomiting, abdominal pain, colitis   Subjective:  No significant improvement in nausea as well as abdominal pain. Abdomen is distended. Remains tachycardic.   ROS : positive for shortness of breath. Positive for weakness   Objective: Vital signs in last 24 hours: Vitals:   03/16/18 1100 03/16/18 1200  BP: (!) 94/59 (!) 97/43  Pulse: (!) 114 (!) 110  Resp: (!) 22 19  Temp:    SpO2: 93% 94%    Physical Exam:  General. Alert and oriented 3. In mild distress. Oral mucosa dry. Heart. Rate rhythm regular. Tachycardia Abdomen - abdomen is distended with generalized tenderness to palpation. Bowel sounds are present. Small area of bruise noted at suprapubic/right inguinal area. Lower extremity. No significant edema but right lower extremity cool to touch. Pulses present but weak.   Lab Results: Recent Labs    03/15/18 0534  03/16/18 0407 03/16/18 1132  NA 136   < > 135 134*  K 4.3   < > 4.1 4.1  CL 112*   < > 109 110  CO2 15*   < > 16* 15*  GLUCOSE 163*   < > 154* 144*  BUN 41*   < > 52* 54*  CREATININE 3.19*   < > 3.03* 2.84*  CALCIUM 7.5*   < > 7.8* 7.7*  MG 2.1  --  2.1  --   PHOS 5.3*  --  5.3*  --    < > = values in this interval not displayed.   No results for input(s): AST, ALT, ALKPHOS, BILITOT, PROT, ALBUMIN in the last 72 hours. Recent Labs    03/16/18 0407 03/16/18 0702 03/16/18 0916  WBC 24.2* 20.7* 21.4*  NEUTROABS 19.9*  --  17.5*  HGB 9.0* 8.6* 8.8*  HCT 26.9* 25.1* 26.0*  MCV 85.7 86.9 85.2  PLT 160 145* 159   Recent Labs    03/15/18 1328  LABPROT 17.9*  INR 1.49      Assessment/Plan: - anemia probably from large abdominal wall /rectal sheet hematoma - right-sided colitis. Most likely ischemia/reperfusion injury after placement of SMA stent. - abdominal pain and distention. Most likely from a large hematoma. - Leukocytosis - respiratory failure -  acute kidney injury.improving - New-onset ascites. Paracentesis on 03/10/2018 showed SAAG >1.1 - CT scan showing nodular liver concerning for cirrhosis. - DVT S/P IVC filter placement . Aspirin and Plavix currently on hold. Heparin discontinued.    Recommendations ------------------------ - patient's hemoglobin is relatively stable now. - monitor H&H. Transfuse as needed - Further management per critical care team. GI will follow from distance.  Otis Brace MD, [place] 03/16/2018, 12:23 PM  Contact #  [phone]

## 2018-03-17 DIAGNOSIS — R52 Pain, unspecified: Secondary | ICD-10-CM

## 2018-03-17 DIAGNOSIS — K551 Chronic vascular disorders of intestine: Secondary | ICD-10-CM

## 2018-03-17 DIAGNOSIS — K529 Noninfective gastroenteritis and colitis, unspecified: Secondary | ICD-10-CM

## 2018-03-17 LAB — MAGNESIUM: Magnesium: 2.2 mg/dL (ref 1.7–2.4)

## 2018-03-17 LAB — CBC WITH DIFFERENTIAL/PLATELET
Basophils Absolute: 0 10*3/uL (ref 0.0–0.1)
Basophils Absolute: 0 10*3/uL (ref 0.0–0.1)
Basophils Relative: 0 %
Basophils Relative: 0 %
Eosinophils Absolute: 0 10*3/uL (ref 0.0–0.7)
Eosinophils Absolute: 0 10*3/uL (ref 0.0–0.7)
Eosinophils Relative: 0 %
Eosinophils Relative: 0 %
HCT: 23.2 % — ABNORMAL LOW (ref 36.0–46.0)
HCT: 23.6 % — ABNORMAL LOW (ref 36.0–46.0)
Hemoglobin: 7.7 g/dL — ABNORMAL LOW (ref 12.0–15.0)
Hemoglobin: 7.8 g/dL — ABNORMAL LOW (ref 12.0–15.0)
Lymphocytes Relative: 6 %
Lymphocytes Relative: 6 %
Lymphs Abs: 1 10*3/uL (ref 0.7–4.0)
Lymphs Abs: 1 10*3/uL (ref 0.7–4.0)
MCH: 29.2 pg (ref 26.0–34.0)
MCH: 29 pg (ref 26.0–34.0)
MCHC: 33.2 g/dL (ref 30.0–36.0)
MCHC: 33.1 g/dL (ref 30.0–36.0)
MCV: 87.9 fL (ref 78.0–100.0)
MCV: 87.7 fL (ref 78.0–100.0)
Monocytes Absolute: 1.9 10*3/uL — ABNORMAL HIGH (ref 0.1–1.0)
Monocytes Absolute: 2 10*3/uL — ABNORMAL HIGH (ref 0.1–1.0)
Monocytes Relative: 12 %
Monocytes Relative: 12 %
Neutro Abs: 13 10*3/uL — ABNORMAL HIGH (ref 1.7–7.7)
Neutro Abs: 13.6 10*3/uL — ABNORMAL HIGH (ref 1.7–7.7)
Neutrophils Relative %: 82 %
Neutrophils Relative %: 82 %
Platelets: 125 10*3/uL — ABNORMAL LOW (ref 150–400)
Platelets: 119 10*3/uL — ABNORMAL LOW (ref 150–400)
RBC: 2.64 MIL/uL — ABNORMAL LOW (ref 3.87–5.11)
RBC: 2.69 MIL/uL — ABNORMAL LOW (ref 3.87–5.11)
RDW: 17.7 % — ABNORMAL HIGH (ref 11.5–15.5)
RDW: 17.6 % — ABNORMAL HIGH (ref 11.5–15.5)
WBC: 15.9 10*3/uL — ABNORMAL HIGH (ref 4.0–10.5)
WBC: 16.6 10*3/uL — ABNORMAL HIGH (ref 4.0–10.5)

## 2018-03-17 LAB — BASIC METABOLIC PANEL
Anion gap: 7 (ref 5–15)
BUN: 51 mg/dL — ABNORMAL HIGH (ref 6–20)
CO2: 22 mmol/L (ref 22–32)
Calcium: 7.6 mg/dL — ABNORMAL LOW (ref 8.9–10.3)
Chloride: 107 mmol/L (ref 101–111)
Creatinine, Ser: 2.15 mg/dL — ABNORMAL HIGH (ref 0.44–1.00)
GFR calc Af Amer: 24 mL/min — ABNORMAL LOW (ref >60)
GFR calc non Af Amer: 21 mL/min — ABNORMAL LOW (ref >60)
Glucose, Bld: 133 mg/dL — ABNORMAL HIGH (ref 65–99)
Potassium: 3.7 mmol/L (ref 3.5–5.1)
Sodium: 136 mmol/L (ref 135–145)

## 2018-03-17 LAB — PHOSPHORUS: Phosphorus: 4 mg/dL (ref 2.5–4.6)

## 2018-03-17 MED ORDER — ENSURE ENLIVE PO LIQD
237.0000 mL | Freq: Three times a day (TID) | ORAL | Status: DC
  Administered 2018-03-17 – 2018-03-25 (×18): 237 mL via ORAL

## 2018-03-17 MED ORDER — METOCLOPRAMIDE HCL 5 MG/ML IJ SOLN
5.0000 mg | Freq: Once | INTRAMUSCULAR | Status: AC
  Administered 2018-03-17: 5 mg via INTRAVENOUS
  Filled 2018-03-17: qty 1

## 2018-03-17 MED ORDER — LACTATED RINGERS IV SOLN
INTRAVENOUS | Status: DC
  Administered 2018-03-17: 11:00:00 via INTRAVENOUS
  Administered 2018-03-18 (×2): 50 mL/h via INTRAVENOUS
  Administered 2018-03-19 – 2018-03-20 (×2): via INTRAVENOUS

## 2018-03-17 NOTE — Progress Notes (Signed)
PULMONARY / CRITICAL CARE MEDICINE   Name: [name] MRN: [id number] DOB: 08/19/1941    ADMISSION DATE:  02/28/2018 CONSULTATION DATE:  03/14/2018  REFERRING MD:  Dr. Grandville Silos  CHIEF COMPLAINT:  Hypotension  HISTORY OF PRESENT ILLNESS:   77 y/o female with CAD, COPD, prior PE admitted on 3/10 with PE and DVT, noted to have severe stenosis of SMA.  Had stenting of SMA on 3/18.  Noted to have abdominal pain, nausea, vomiting.  On 3/23 noted to have a large abdominal wall hematoma.      SUBJECTIVE:  Feels better   VITAL SIGNS: Blood Pressure (Abnormal) 123/55   Pulse (Abnormal) 107   Temperature 98.6 F (37 C) (Oral)   Respiration 15   Height 5\' 8"  (1.727 m)   Weight 208 lb 8.9 oz (94.6 kg)   Oxygen Saturation 99%   Body Mass Index 31.71 kg/m   HEMODYNAMICS:    VENTILATOR SETTINGS:    INTAKE / OUTPUT: I/O last 3 completed shifts: In: 3555.3 [I.V.:3055.3; IV Piggyback:500] Out: 21 [Urine:1410]  PHYSICAL EXAMINATION:  General: 77 year old white female resting comfortably in bed she is in no acute distress HEENT: Normocephalic atraumatic  no jugular venous distention.  Mucous membranes moist. Pulmonary: Clear to auscultation without accessory use Cardiac: Regular rate and rhythm  Abdomen: Slightly distended generalized discomfort to palpation but improve ecchymosis over right lower quadrant, bowel sounds improved as well. GU: Clear yellow Extremities/muscular skeletal warm dry, dependent edema, brisk cap refill, strong pulses. Neuro/psych: Awake oriented no focal deficits   LABS:  BMET Recent Labs  Lab 03/16/18 1132 03/16/18 1600 03/17/18 0430  NA 134* 135 136  K 4.1 4.0 3.7  CL 110 109 107  CO2 15* 17* 22  BUN 54* 56* 51*  CREATININE 2.84* 2.72* 2.15*  GLUCOSE 144* 153* 133*    Electrolytes Recent Labs  Lab 03/15/18 0534  03/16/18 0407 03/16/18 1132 03/16/18 1600 03/17/18 0430  CALCIUM 7.5*   < > 7.8* 7.7* 7.6* 7.6*  MG 2.1  --  2.1  --    --  2.2  PHOS 5.3*  --  5.3*  --   --  4.0   < > = values in this interval not displayed.    CBC Recent Labs  Lab 03/16/18 2208 03/17/18 0430 03/17/18 0757  WBC 17.4* 15.9* 16.6*  HGB 7.9* 7.7* 7.8*  HCT 23.9* 23.2* 23.6*  PLT 121* 125* 119*    Coag's Recent Labs  Lab 03/15/18 1328  APTT 25  INR 1.49    Sepsis Markers Recent Labs  Lab 03/14/18 1103 03/15/18 1328 03/16/18 1132  LATICACIDVEN 4.7* 1.5 1.1    ABG Recent Labs  Lab 03/13/18 1300 03/14/18 0127  PHART 7.355 7.374  PCO2ART 32.5 29.2*  PO2ART 88.7 139*    Liver Enzymes Recent Labs  Lab 03/11/18 0337  AST 21  ALT 19  ALKPHOS 61  BILITOT 0.6  ALBUMIN 2.6*    Cardiac Enzymes Recent Labs  Lab 03/13/18 1329 03/13/18 1709 03/14/18 1103  TROPONINI <0.03 <0.03 0.04*    Glucose Recent Labs  Lab 03/14/18 0324  GLUCAP 177*    Imaging Dg Abd Portable 1v  Result Date: 03/16/2018 CLINICAL DATA:  Abdominal pain EXAM: PORTABLE ABDOMEN - 1 VIEW COMPARISON:  None. FINDINGS: There is no bowel dilatation or air-fluid level to suggest bowel obstruction. No free air. There is a total hip replacement on the right as well as postoperative change in the lumbar region. There is a filter  in the inferior vena cava. There are calcified breast implants bilaterally. IMPRESSION: No demonstrable bowel obstruction or free air. Electronically Signed   By: Lowella Grip III M.D.   On: 03/16/2018 11:37     STUDIES:  CT ABD Jerene Pitch 3/19>Interval development of moderate abdominopelvic ascites , Colonic wall thickening involving the cecum, ascending, proximal transverse colon CT abd /pelvis 3/24>>New, large anterior abdominal wall hematoma measuring 16.9 x 11.9x 12.4 cm.2. Small volume ascites with nodular appearance of the liver concerning for cirrhosis.. Medium-sized right pleural effusion  CULTURES: 3/20 Peritoneal fluid >neg  3/24 BC x 2   ANTIBIOTICS: Cipro 3/23 >> Flagyl 3/23 >>   SIGNIFICANT  EVENTS: Admit 3/10 PE/DVT  3/18 SMA stenosis s/p stent  3/20 moderate ascites on CT ABD , s/p paracentesis with 100cc removed.  3/24 Hypotension/transfer to ICU , large abd wall hematoma , hep stopped, PRBC x 2 , Platelet x 1  3/24 Retrievable IVC filter placed    DISCUSSION: 77 y/o female with a history of PE and DVT was admitted in the setting of the same, noted to have SMA stenosis which was stented, now has persistent anemia in setting of a large rectus sheath hematoma.    ASSESSMENT / PLAN: Resolved issues:  Hemorrhagic shock Lactic acidosis   Current issues:   Right sided Ischemic colitis s/p stent placement to SMA on 3/18 w/ on-going nausea, now resolved Plan Continue PRN antiemetics Advance diet  Pulmonary Emboli/ left fem DVT S/p IVC filter 3/24 Plan Wean oxygen We will eventually need to decide on timing of reinitiation of anticoagulation  Hemorrhagic anemia w/ rectus sheath hematoma and associated pain Hemoglobin appears stable.  Dropped from 9-8.6 over the last 24 hours Plan Trend CBC Hold anticoagulation PRN analgesia  AKI w/ NAG metabolic acidosis  -suspect mix of low perfusion and dye load -Creatinine and bicarbonate of both improved Plan Discontinue bicarbonate, transition back to lactated Ringer's at 50 cc an hour Repeat chemistry in a.m. Renal dose medications Continue strict INO   New ascites s/p paracentesis 3/20 w SAAG >1.1-->felt inflammatory  CT scan w/ finding worrisome for Cirrhosis  Plan Monitor LFTs Further cirrhosis evaluation as outpatient    Hyperglycemia Plan Sliding scale insulin   FAMILY  - Updates: granddaughter updated bedside 3/35  - Inter-disciplinary family meet or Palliative Care meeting due by:  Day 7  DVT prophylaxis: IVC filter SUP: ppi  Diet: adv Activity: BR-->OOB Disposition : ICU-->SDU   Erick Colace ACNP-BC [place] Pager # [phone] OR # [phone] if no answer  03/17/2018,  10:51 AM

## 2018-03-17 NOTE — Progress Notes (Signed)
  Progress Note    03/17/2018 8:00 AM 9 Days Post-Op  Subjective:  Still having abdominal pain  Vitals:   03/17/18 0500 03/17/18 0600  BP: (!) 102/55 (!) 110/52  Pulse: 99 97  Resp: 15 16  Temp:    SpO2: 94% 94%    Physical Exam: Generalized edema Abdomen diffusely ttp, unchanged from previous exam  CBC    Component Value Date/Time   WBC 15.9 (H) 03/17/2018 0430   RBC 2.64 (L) 03/17/2018 0430   HGB 7.7 (L) 03/17/2018 0430   HGB 12.9 08/17/2017 1038   HCT 23.2 (L) 03/17/2018 0430   HCT 38.2 08/17/2017 1038   PLT 125 (L) 03/17/2018 0430   PLT 237 08/17/2017 1038   MCV 87.9 03/17/2018 0430   MCV 88 08/17/2017 1038   MCH 29.2 03/17/2018 0430   MCHC 33.2 03/17/2018 0430   RDW 17.7 (H) 03/17/2018 0430   RDW 15.7 (H) 08/17/2017 1038   LYMPHSABS 1.0 03/17/2018 0430   MONOABS 1.9 (H) 03/17/2018 0430   EOSABS 0.0 03/17/2018 0430   BASOSABS 0.0 03/17/2018 0430    BMET    Component Value Date/Time   NA 136 03/17/2018 0430   NA 144 09/14/2017 0921   K 3.7 03/17/2018 0430   CL 107 03/17/2018 0430   CO2 22 03/17/2018 0430   GLUCOSE 133 (H) 03/17/2018 0430   BUN 51 (H) 03/17/2018 0430   BUN 12 09/14/2017 0921   CREATININE 2.15 (H) 03/17/2018 0430   CALCIUM 7.6 (L) 03/17/2018 0430   GFRNONAA 21 (L) 03/17/2018 0430   GFRAA 24 (L) 03/17/2018 0430    INR    Component Value Date/Time   INR 1.49 03/15/2018 1328     Intake/Output Summary (Last 24 hours) at 03/17/2018 0800 Last data filed at 03/17/2018 0616 Gross per 24 hour  Intake 2205.25 ml  Output 1115 ml  Net 1090.25 ml     Assessment/plan  77 y.o. female is s/p sma stenting for chronic mesenteric ischemia. Recent aki with rectus sheath hematoma. Aspirin when ok from bleeding risk to prevent stent thrombosis.    Karyssa Amaral C. Donzetta Matters, MD [facility] Office: [phone] Pager: [phone]  03/17/2018 8:00 AM

## 2018-03-17 NOTE — Progress Notes (Signed)
Nutrition Follow-up  DOCUMENTATION CODES:   Not applicable  INTERVENTION:    Ensure Enlive po TID, each supplement provides 350 kcal and 20 grams of protein  NUTRITION DIAGNOSIS:   Increased nutrient needs related to chronic illness as evidenced by estimated needs.  Ongoing  GOAL:   Patient will meet greater than or equal to 90% of their needs  Unmet  MONITOR:   PO intake, Supplement acceptance, Labs, Weight trends, Skin, I & O's  REASON FOR ASSESSMENT:   Consult Poor PO  ASSESSMENT:   [name] is a 77 y.o. female with medical history significant of CAD s/p CABG 3875, diastolic CHF with EF 64%, COPD currently on PRN O2 at home, prior DVT not currently anticoagulated due to GIB last year.  Patient presents to the ED with c/o 2-3 weeks of nausea, generalized weakness, SOB on exertion, needing to use more O2 at home on exertion than baseline.  Discussed patient in ICU rounds and with RN today. Found to have rectus sheath hematoma and transferred to the ICU on 3/23. She has been NPO due to nausea, which has now resolved. Received MD consult because diet was just advanced and patient needs supplements. Labs reviewed. Medications reviewed and include vitamin D, colace, ferrous sulfate.  Diet Order:  Diet regular Room service appropriate? Yes; Fluid consistency: Thin  EDUCATION NEEDS:   Education needs have been addressed  Skin:  Skin Assessment: Reviewed RN Assessment  Last BM:  3/23  Height:   Ht Readings from Last 1 Encounters:  03/01/18 5\' 8"  (1.727 m)    Weight:   Wt Readings from Last 1 Encounters:  03/17/18 208 lb 8.9 oz (94.6 kg)    Ideal Body Weight:  63.6 kg  BMI:  Body mass index is 31.71 kg/m.  Estimated Nutritional Needs:   Kcal:  1700-1900  Protein:  85-100 grams  Fluid:  1.7-1.9 L    Molli Barrows, RD, LDN, CNSC Pager [phone] After Hours Pager [phone]

## 2018-03-18 ENCOUNTER — Ambulatory Visit: Payer: Self-pay | Admitting: *Deleted

## 2018-03-18 ENCOUNTER — Inpatient Hospital Stay (HOSPITAL_COMMUNITY): Payer: Medicare HMO

## 2018-03-18 LAB — BASIC METABOLIC PANEL
Anion gap: 8 (ref 5–15)
BUN: 41 mg/dL — ABNORMAL HIGH (ref 6–20)
CO2: 22 mmol/L (ref 22–32)
Calcium: 7.8 mg/dL — ABNORMAL LOW (ref 8.9–10.3)
Chloride: 108 mmol/L (ref 101–111)
Creatinine, Ser: 1.35 mg/dL — ABNORMAL HIGH (ref 0.44–1.00)
GFR calc Af Amer: 43 mL/min — ABNORMAL LOW (ref >60)
GFR calc non Af Amer: 37 mL/min — ABNORMAL LOW (ref >60)
Glucose, Bld: 116 mg/dL — ABNORMAL HIGH (ref 65–99)
Potassium: 3.7 mmol/L (ref 3.5–5.1)
Sodium: 138 mmol/L (ref 135–145)

## 2018-03-18 LAB — CBC WITH DIFFERENTIAL/PLATELET
Basophils Absolute: 0 10*3/uL (ref 0.0–0.1)
Basophils Relative: 0 %
Eosinophils Absolute: 0 10*3/uL (ref 0.0–0.7)
Eosinophils Relative: 0 %
HCT: 24.2 % — ABNORMAL LOW (ref 36.0–46.0)
Hemoglobin: 7.9 g/dL — ABNORMAL LOW (ref 12.0–15.0)
Lymphocytes Relative: 5 %
Lymphs Abs: 0.8 10*3/uL (ref 0.7–4.0)
MCH: 29.2 pg (ref 26.0–34.0)
MCHC: 32.6 g/dL (ref 30.0–36.0)
MCV: 89.3 fL (ref 78.0–100.0)
Monocytes Absolute: 1.9 10*3/uL — ABNORMAL HIGH (ref 0.1–1.0)
Monocytes Relative: 12 %
Neutro Abs: 12.9 10*3/uL — ABNORMAL HIGH (ref 1.7–7.7)
Neutrophils Relative %: 83 %
Platelets: 120 10*3/uL — ABNORMAL LOW (ref 150–400)
RBC: 2.71 MIL/uL — ABNORMAL LOW (ref 3.87–5.11)
RDW: 18.4 % — ABNORMAL HIGH (ref 11.5–15.5)
WBC: 15.6 10*3/uL — ABNORMAL HIGH (ref 4.0–10.5)

## 2018-03-18 LAB — PATHOLOGIST SMEAR REVIEW

## 2018-03-18 LAB — BRAIN NATRIURETIC PEPTIDE: B Natriuretic Peptide: 362.6 pg/mL — ABNORMAL HIGH (ref 0.0–100.0)

## 2018-03-18 LAB — PHOSPHORUS: Phosphorus: 3.3 mg/dL (ref 2.5–4.6)

## 2018-03-18 LAB — PROCALCITONIN: Procalcitonin: 0.44 ng/mL

## 2018-03-18 LAB — MAGNESIUM: Magnesium: 2.3 mg/dL (ref 1.7–2.4)

## 2018-03-18 MED ORDER — ASPIRIN EC 81 MG PO TBEC
81.0000 mg | DELAYED_RELEASE_TABLET | Freq: Every day | ORAL | Status: DC
  Administered 2018-03-18 – 2018-03-25 (×8): 81 mg via ORAL
  Filled 2018-03-18 (×8): qty 1

## 2018-03-18 MED ORDER — METOCLOPRAMIDE HCL 5 MG/ML IJ SOLN
5.0000 mg | Freq: Four times a day (QID) | INTRAMUSCULAR | Status: DC | PRN
  Administered 2018-03-18 – 2018-03-20 (×7): 5 mg via INTRAVENOUS
  Filled 2018-03-18 (×9): qty 1

## 2018-03-18 MED ORDER — METOCLOPRAMIDE HCL 5 MG/ML IJ SOLN
5.0000 mg | Freq: Once | INTRAMUSCULAR | Status: AC
  Administered 2018-03-18: 5 mg via INTRAVENOUS
  Filled 2018-03-18: qty 1

## 2018-03-18 NOTE — Progress Notes (Signed)
Pt's daughter called this am for update which was politely postponed by Probation officer due to current lack of password per [employer], and she agrees to set one up upon her arrival this am.  Will continue to monitor.

## 2018-03-18 NOTE — Progress Notes (Signed)
PT Cancellation Note  Patient Details Name: [name] MRN: [id number] DOB: [birth year]   Cancelled Treatment:    Reason Eval/Treat Not Completed: Medical issues which prohibited therapy(Pt too nauseaous per nurse. Check back tomorrow. )   Denice Paradise 03/18/2018, 1:35 PM  Kysean Sweet,PT Acute Rehabilitation [phone]

## 2018-03-18 NOTE — Progress Notes (Signed)
[facility]                                                                              Patient Demographics  [name], is a 77 y.o. female, DOB - 1941-03-27, [place] date - 02/28/2018   Admitting Physician Etta Quill, DO  Outpatient Primary MD for the patient is [name], MD  Outpatient specialists:   LOS - 17  days   Medical records reviewed and are as summarized below:    Chief Complaint  Patient presents with  . Nausea       Brief summary   77 y/o female with CAD, COPD, prior PE, history of small bowel AVMs and had been off anticoagulants and since February 2018 was admitted on 3/10 with nausea, weakness and dyspnea on exertion, acute respiratory failure with hypoxia.  Workup showed multiple subacute pulmonary embolus on CT scan as well as large acute DVT in the left femoral vein.  She was incidentally found to have severe stenosis of superior mesenteric artery.  Patient was started on heparin drip, admitted to hospitalist service, had stenting of SMA on 3/18.  Since the stent placement, she had worsening abdominal pain, nausea and vomiting.  CTA abdomen and pelvis on 3/19 revealed patent SMA stent with moderate abdominopelvic ascites, colonic wall thickening.  GI was consulted, symptoms thought to be from ischemic colitis.  However hemoglobin continued to drop and on 3/24 AM, patient had a rapid response with hypotension, acute hypoxic respiratory failure, severe abdominal pain.  Repeat CTabdomen pelvis on 3/24 showed a new large anterior abdominal wall hematoma measuring 16.9 x 11.9  X 12.4 cm.  Hemoglobin 6.6.  Heparin drip, Plavix were discontinued, patient was transfused 2 units packed RBC and transferred to ICU.  She was placed on vasopressors per IVC filter placed on 3/24.  Patient's creatinine function continued to trend up, renal ultrasound did not show any obstruction or hydronephrosis, possibly due to hypoperfusion/ATN.  For  the new onset ascites, patient underwent paracentesis on 3/20 which showed SAAG >1.1 Vascular surgery following patient, recommended starting aspirin once hemoglobin has remained stable.   Assessment & Plan    Principal Problem: Right-sided ischemic colitis, superior mesenteric artery stenosis status post stent placement to SMA on 3/18.  -Complaining of persistent nausea, abdominal pain - will continue Compazine, added Reglan, DC Zofran. (QTC 490, follow closely) -Change to clear liquid diet -Vascular surgery following, will follow recommendations regarding timing of reinitiation of anticoagulation -Antibiotics completed 3/27 however low threshold for initiating if fevers or worsening leukocytosis  Active Problems: Acute hypoxic respiratory failure with pulmonary emboli, left femoral DVT, COPD -Status post IVC filter on 3/24 -Wean O2 as tolerated -Patient complaining of dyspnea/shallow breathing, chest x-ray today showed COPD, mild cardiomegaly but no pulmonary edema or pneumonia or pleural effusion -Follow I's and O's, currently 18L positive, follow BNP -Continue duo nebs scheduled and as needed  Acute on chronic blood loss anemia, hemorrhagic shock, rectus sheath hematoma, abdominal pain -Hemoglobin holding between 7.8 to 7.9 -Follow H&H, transfuse if less than 7.5. -Hemorrhagic shock resolved, had briefly required vasopressors  Acute kidney injury  with non-anion gap metabolic acidosis -Possibly due to hypoperfusion, ATN and contrast.  Baseline creatinine 0.7-1 -Creatinine improving today, peaked at 3.2, 1.3 today -Renal ultrasound did not show any obstruction or hydronephrosis  New ascites status post paracentesis 3/20 - SAAG >1.1, felt inflammatory, CT scan with cirrhosis, ischemic colitis -GI following  Code Status: Partial DVT Prophylaxis: SCD Family Communication: Discussed in detail with the patient, all imaging results, lab results explained to the patient    Disposition Plan: When medically stable, continue in stepdown  Time Spent in minutes 45 minutes  Procedures:   3/18 SMA stenosis status post stent CT ABD /Pelvis 3/19>Interval development of moderate abdominopelvic ascites,Colonic wall thickening involving the cecum, ascending, proximal transverse colon 3/20 status post paracentesis with 100 cc removed CT abd /pelvis 3/24>>New, large anterior abdominal wall hematoma measuring 16.9 x 11.9x 12.4 cm.2. Small volume ascites with nodular appearance of the liver concerning for cirrhosis.. Medium-sized right pleural effusion 3/24: Retrievable IVC filter placed  Consultants:   PCCM Vascular surgery IR Gastroenterology  Antimicrobials:      Medications  Scheduled Meds: . chlorhexidine  15 mL Mouth Rinse BID  . cholecalciferol  1,000 Units Oral Daily  . citalopram  30 mg Oral Daily  . docusate sodium  100 mg Oral BID  . feeding supplement (ENSURE ENLIVE)  237 mL Oral TID BM  . ferrous sulfate  325 mg Oral Q breakfast  . ipratropium-albuterol  3 mL Inhalation BID  . lidocaine  1 patch Transdermal Q24H  . mouth rinse  15 mL Mouth Rinse q12n4p  . pantoprazole  40 mg Oral Daily  . pilocarpine  1 drop Both Eyes BID  . pramipexole  0.25 mg Oral QHS  . senna  1 tablet Oral QHS  . sodium chloride flush  3 mL Intravenous Q12H  . sodium chloride flush  3 mL Intravenous Q12H   Continuous Infusions: . lactated ringers 50 mL/hr (03/18/18 0438)  . sodium chloride     PRN Meds:.acetaminophen **OR** acetaminophen, HYDROmorphone (DILAUDID) injection, ipratropium-albuterol, metoCLOPramide (REGLAN) injection, oxyCODONE, prochlorperazine, sodium chloride flush, sodium chloride flush, traZODone   Antibiotics   Anti-infectives (From admission, onward)   Start     Dose/Rate Route Frequency Ordered Stop   03/16/18 0600  ciprofloxacin (CIPRO) IVPB 200 mg  Status:  Discontinued     200 mg 100 mL/hr over 60 Minutes Intravenous Every 24 hours  03/15/18 0933 03/17/18 1223   03/15/18 0600  ciprofloxacin (CIPRO) IVPB 400 mg  Status:  Discontinued     400 mg 200 mL/hr over 60 Minutes Intravenous Every 24 hours 03/14/18 1259 03/15/18 0933   03/13/18 1800  ciprofloxacin (CIPRO) IVPB 400 mg  Status:  Discontinued     400 mg 200 mL/hr over 60 Minutes Intravenous Every 12 hours 03/13/18 1711 03/14/18 1259   03/13/18 1800  metroNIDAZOLE (FLAGYL) IVPB 500 mg  Status:  Discontinued     500 mg 100 mL/hr over 60 Minutes Intravenous Every 8 hours 03/13/18 1711 03/16/18 1100   03/10/18 1230  metroNIDAZOLE (FLAGYL) IVPB 500 mg  Status:  Discontinued     500 mg 100 mL/hr over 60 Minutes Intravenous Every 8 hours 03/10/18 1208 03/10/18 1815   03/10/18 1230  ciprofloxacin (CIPRO) IVPB 400 mg  Status:  Discontinued     400 mg 200 mL/hr over 60 Minutes Intravenous Every 12 hours 03/10/18 1208 03/10/18 1815        Subjective:   Shaunte Tuft was seen and examined today.  Feels miserable,  with nausea, abdominal pain.  Low-grade temp 99.1 F.  Denies any chest pain.  Still feels shallow breathing.  Denies any new weakness, numbness or tingling.  No diarrhea.   Objective:   Vitals:   03/18/18 0830 03/18/18 0900 03/18/18 0924 03/18/18 1000  BP:  (!) 117/57  (!) 102/50  Pulse:  (!) 33  98  Resp:  (!) 22  20  Temp: 99.1 F (37.3 C)     TempSrc: Oral     SpO2:  97% 97% 96%  Weight:      Height:        Intake/Output Summary (Last 24 hours) at 03/18/2018 1114 Last data filed at 03/18/2018 1000 Gross per 24 hour  Intake 2624.33 ml  Output 915 ml  Net 1709.33 ml     Wt Readings from Last 3 Encounters:  03/18/18 97 kg (213 lb 13.5 oz)  11/19/17 84.1 kg (185 lb 6.4 oz)  09/14/17 78.6 kg (173 lb 3.2 oz)     Exam  General: Alert and oriented x 3, NAD, ill-appearing,  uncomfortable  Eyes:   HEENT:  Atraumatic, normocephalic  Cardiovascular: S1 S2 auscultated, no rubs, murmurs or gallops. Regular rate and rhythm.  Respiratory:  Clear to auscultation bilaterally, no wheezing, rales or rhonchi  Gastrointestinal: Soft, + diffuse tenderness, slightly distended, + bowel sounds  Ext: no pedal edema bilaterally  Neuro: no new deficits  Musculoskeletal: No digital cyanosis, clubbing  Skin: No rashes  Psych: Normal affect and demeanor, alert and oriented x3    Data Reviewed:  I have personally reviewed following labs and imaging studies  Micro Results Recent Results (from the past 240 hour(s))  Gram stain     Status: None   Collection Time: 03/10/18 10:15 AM  Result Value Ref Range Status   Specimen Description PERITONEAL  Final   Special Requests NONE  Final   Gram Stain   Final    RARE WBC PRESENT, PREDOMINANTLY MONONUCLEAR NO ORGANISMS SEEN Performed at [facility], [address], [place], [place] [address]    Report Status 03/10/2018 FINAL  Final  Culture, body fluid-bottle     Status: None   Collection Time: 03/10/18 10:15 AM  Result Value Ref Range Status   Specimen Description PERITONEAL  Final   Special Requests NONE  Final   Culture   Final    NO GROWTH 5 DAYS Performed at [place] [address] [place], [place] [address]    Report Status 03/15/2018 FINAL  Final  Culture, Urine     Status: Abnormal   Collection Time: 03/13/18  4:42 PM  Result Value Ref Range Status   Specimen Description URINE, CATHETERIZED  Final   Special Requests NONE  Final   Culture (A)  Final    <10,000 COLONIES/mL INSIGNIFICANT GROWTH Performed at [facility], [place] [address] [place], [place] [address]    Report Status 03/14/2018 FINAL  Final  Culture, blood (Routine X 2) w Reflex to ID Panel     Status: None (Preliminary result)   Collection Time: 03/14/18 11:15 AM  Result Value Ref Range Status   Specimen Description BLOOD LEFT HAND  Final   Special Requests   Final    BOTTLES DRAWN AEROBIC ONLY Blood Culture adequate volume   Culture   Final    NO GROWTH 3 DAYS Performed at  [facility], [place] [address] [place], [place] [address]    Report Status PENDING  Incomplete  Culture, blood (Routine X 2) w Reflex to ID Panel     Status: None (Preliminary result)   Collection Time: 03/14/18 11:17 AM  Result Value Ref Range Status   Specimen Description BLOOD LEFT HAND  Final   Special Requests   Final    BOTTLES DRAWN AEROBIC ONLY Blood Culture results may not be optimal due to an inadequate volume of blood received in culture bottles   Culture   Final    NO GROWTH 3 DAYS Performed at [facility], [place] [address], [name], [place] [address]    Report Status PENDING  Incomplete    Radiology Reports Ct Abdomen Pelvis Wo Contrast  Addendum Date: 03/14/2018   ADDENDUM REPORT: 03/14/2018 04:14 ADDENDUM: These results were called by telephone at the time of interpretation on 03/14/2018 at 4:04 am to [facility] , who verbally acknowledged these results. Electronically Signed   By: Ulyses Jarred M.D.   On: 03/14/2018 04:14   Result Date: 03/14/2018 CLINICAL DATA:  Abdominal pain and fever EXAM: CT ABDOMEN AND PELVIS WITHOUT CONTRAST TECHNIQUE: Multidetector CT imaging of the abdomen and pelvis was performed following the standard protocol without IV contrast. COMPARISON:  CT abdomen pelvis 03/09/2018 FINDINGS: Examination is degraded by streak artifact from external patient devices and from the arms down positioning of the patient, creating multiple streak artifacts and decreasing sensitivity and specificity of this scan. Lower chest: Right pleural effusion and associated atelectasis. Hepatobiliary: Right upper quadrant ascites. Nodular appearance of the liver. No focal liver lesion. Status post cholecystectomy. Pancreas: Atrophic appearance of the pancreas. Spleen: Normal. Adrenals/Urinary Tract: --Adrenal glands: Normal. --Right kidney/ureter: No hydronephrosis, perinephric stranding or nephrolithiasis. No obstructing ureteral stones. --Left kidney/ureter: No  hydronephrosis, perinephric stranding or nephrolithiasis. No obstructing ureteral stones. --Urinary bladder: Normal appearance for the degree of distention. Stomach/Bowel: --Stomach/Duodenum: No hiatal hernia or other gastric abnormality. Normal duodenal course. --Small bowel: No dilatation or inflammation. --Colon: Diffuse colonic diverticulosis without acute inflammation. --Appendix: Normal. Vascular/Lymphatic: Atherosclerotic calcification is present within the non-aneurysmal abdominal aorta, without hemodynamically significant stenosis. No abdominal or pelvic lymphadenopathy. Reproductive: Pelvis largely obscured by streak artifact. Musculoskeletal. Right total hip arthroplasty. L1-L3 posterior spinal fusion. Other: Large anterior abdominal wall hematoma measuring up to 16.9 x 11.9 x 12.4 cm. This is new compared to the recent study from 03/09/2018. This exerts mass effect on the intra-abdominal contents. IMPRESSION: 1. New, large anterior abdominal wall hematoma measuring 16.9 x 11.9 x 12.4 cm. 2. Small volume ascites with nodular appearance of the liver concerning for cirrhosis. 3. Medium-sized right pleural effusion with associated atelectasis. 4. Diffuse colonic diverticulosis without acute inflammation. 5.  Aortic Atherosclerosis (ICD10-I70.0). Electronically Signed: By: Ulyses Jarred M.D. On: 03/14/2018 03:45   Ct Angio Chest Pe W/cm &/or Wo Cm  Result Date: 03/01/2018 CLINICAL DATA:  Subacute onset of nausea. Generalized abdominal pain. Mild generalized chest pain and shortness of breath. EXAM: CT ANGIOGRAPHY CHEST CT ABDOMEN AND PELVIS WITH CONTRAST TECHNIQUE: Multidetector CT imaging of the chest was performed using the standard protocol during bolus administration of intravenous contrast. Multiplanar CT image reconstructions and MIPs were obtained to evaluate the vascular anatomy. Multidetector CT imaging of the abdomen and pelvis was performed using the standard protocol during bolus  administration of intravenous contrast. CONTRAST:  80 mL of Isovue 370 IV contrast COMPARISON:  CT of the abdomen and pelvis performed 11/08/2017, and CT of the thoracic spine performed 07/26/2014 FINDINGS: CTA CHEST FINDINGS Cardiovascular: There appears to be subacute or chronic  pulmonary embolus within the pulmonary artery to the left lower lobe, extending minimally into segmental branches. No additional pulmonary embolus is identified. The RV/LV ratio of 1.1 may reflect right heart strain. Diffuse coronary artery calcifications are seen. Diffuse calcification is noted along the aortic arch and proximal great vessels. The heart remains normal in size. The patient is status post median sternotomy. There is a chronic fracture of the inferior-most sternal wire. Mediastinum/Nodes: The mediastinum is otherwise unremarkable in appearance. No mediastinal lymphadenopathy is seen. No pericardial effusion is identified. The thyroid gland is grossly unremarkable. No axillary lymphadenopathy is seen. Lungs/Pleura: Minimal bilateral atelectasis is noted. The lungs are otherwise clear. Mild scarring is noted at the lung apices. No significant focal consolidation, pleural effusion or pneumothorax is seen. No masses are identified. Musculoskeletal: No acute osseous abnormalities are identified. The patient's right shoulder arthroplasty is incompletely imaged but appears grossly unremarkable. The visualized musculature is unremarkable in appearance. Diffuse calcification is noted about the patient's bilateral breast implants. Review of the MIP images confirms the above findings. CT ABDOMEN and PELVIS FINDINGS Hepatobiliary: The liver is unremarkable in appearance. The patient is status post cholecystectomy, with clips noted at the gallbladder fossa. There is corresponding distention of the intrahepatic biliary ducts and common bile duct. Pancreas: The pancreas is atrophic and grossly unremarkable in appearance. Spleen: The spleen  is unremarkable in appearance. Adrenals/Urinary Tract: Minimal calcification is noted at the left adrenal gland, likely adjacent to a small underlying adrenal lesion. The right adrenal gland is unremarkable. A right renal cyst is again noted, better characterized on the prior study. The kidneys are otherwise unremarkable. There is no evidence of hydronephrosis. No perinephric stranding is seen. Stomach/Bowel: The stomach is unremarkable in appearance. The small bowel is within normal limits. The appendix is normal in caliber, without evidence of appendicitis. The colon is unremarkable in appearance. Vascular/Lymphatic: Diffuse calcification is seen along the abdominal aorta and its branches, including along the right renal artery and superior mesenteric artery. There is likely relatively severe luminal narrowing at the proximal superior mesenteric artery. The abdominal aorta is otherwise grossly unremarkable. The inferior vena cava is grossly unremarkable. No retroperitoneal lymphadenopathy is seen. No pelvic sidewall lymphadenopathy is identified. Reproductive: The bladder is mildly distended and grossly unremarkable. The uterus is unremarkable in appearance. The ovaries are grossly symmetric. No suspicious adnexal masses are seen. Other: No additional soft tissue abnormalities are seen. Musculoskeletal: No acute osseous abnormalities are identified. The patient's right hip arthroplasty is incompletely imaged, but appears grossly unremarkable. The patient is status post lumbar spinal fusion at L1-L3, with underlying decompression from L1-L4. The visualized musculature is unremarkable in appearance. Review of the MIP images confirms the above findings. IMPRESSION: 1. Apparent subacute or chronic pulmonary embolus within the pulmonary artery to the left lower lung lobe, extending minimally into segmental branches. RV/LV ratio of 1.1 may reflect right heart strain. Would correlate with the patient's symptoms. 2.  Minimal bilateral atelectasis noted. Mild scarring at the lung apices. 3. Diffuse coronary artery calcifications seen. 4. Diffuse aortic atherosclerosis, with diffuse calcification along the right renal artery and superior mesenteric artery. Likely relatively severe luminal narrowing at the proximal superior mesenteric artery. Would correlate for any associated symptoms. Critical Value/emergent results were called by telephone at the time of interpretation on 03/01/2018 at 1:00 am to the ER clinical team, who verbally acknowledged these results. Electronically Signed   By: Garald Balding M.D.   On: 03/01/2018 01:03   Ct Abdomen Pelvis W  Contrast  Result Date: 03/01/2018 CLINICAL DATA:  Subacute onset of nausea. Generalized abdominal pain. Mild generalized chest pain and shortness of breath. EXAM: CT ANGIOGRAPHY CHEST CT ABDOMEN AND PELVIS WITH CONTRAST TECHNIQUE: Multidetector CT imaging of the chest was performed using the standard protocol during bolus administration of intravenous contrast. Multiplanar CT image reconstructions and MIPs were obtained to evaluate the vascular anatomy. Multidetector CT imaging of the abdomen and pelvis was performed using the standard protocol during bolus administration of intravenous contrast. CONTRAST:  80 mL of Isovue 370 IV contrast COMPARISON:  CT of the abdomen and pelvis performed 11/08/2017, and CT of the thoracic spine performed 07/26/2014 FINDINGS: CTA CHEST FINDINGS Cardiovascular: There appears to be subacute or chronic pulmonary embolus within the pulmonary artery to the left lower lobe, extending minimally into segmental branches. No additional pulmonary embolus is identified. The RV/LV ratio of 1.1 may reflect right heart strain. Diffuse coronary artery calcifications are seen. Diffuse calcification is noted along the aortic arch and proximal great vessels. The heart remains normal in size. The patient is status post median sternotomy. There is a chronic fracture  of the inferior-most sternal wire. Mediastinum/Nodes: The mediastinum is otherwise unremarkable in appearance. No mediastinal lymphadenopathy is seen. No pericardial effusion is identified. The thyroid gland is grossly unremarkable. No axillary lymphadenopathy is seen. Lungs/Pleura: Minimal bilateral atelectasis is noted. The lungs are otherwise clear. Mild scarring is noted at the lung apices. No significant focal consolidation, pleural effusion or pneumothorax is seen. No masses are identified. Musculoskeletal: No acute osseous abnormalities are identified. The patient's right shoulder arthroplasty is incompletely imaged but appears grossly unremarkable. The visualized musculature is unremarkable in appearance. Diffuse calcification is noted about the patient's bilateral breast implants. Review of the MIP images confirms the above findings. CT ABDOMEN and PELVIS FINDINGS Hepatobiliary: The liver is unremarkable in appearance. The patient is status post cholecystectomy, with clips noted at the gallbladder fossa. There is corresponding distention of the intrahepatic biliary ducts and common bile duct. Pancreas: The pancreas is atrophic and grossly unremarkable in appearance. Spleen: The spleen is unremarkable in appearance. Adrenals/Urinary Tract: Minimal calcification is noted at the left adrenal gland, likely adjacent to a small underlying adrenal lesion. The right adrenal gland is unremarkable. A right renal cyst is again noted, better characterized on the prior study. The kidneys are otherwise unremarkable. There is no evidence of hydronephrosis. No perinephric stranding is seen. Stomach/Bowel: The stomach is unremarkable in appearance. The small bowel is within normal limits. The appendix is normal in caliber, without evidence of appendicitis. The colon is unremarkable in appearance. Vascular/Lymphatic: Diffuse calcification is seen along the abdominal aorta and its branches, including along the right renal  artery and superior mesenteric artery. There is likely relatively severe luminal narrowing at the proximal superior mesenteric artery. The abdominal aorta is otherwise grossly unremarkable. The inferior vena cava is grossly unremarkable. No retroperitoneal lymphadenopathy is seen. No pelvic sidewall lymphadenopathy is identified. Reproductive: The bladder is mildly distended and grossly unremarkable. The uterus is unremarkable in appearance. The ovaries are grossly symmetric. No suspicious adnexal masses are seen. Other: No additional soft tissue abnormalities are seen. Musculoskeletal: No acute osseous abnormalities are identified. The patient's right hip arthroplasty is incompletely imaged, but appears grossly unremarkable. The patient is status post lumbar spinal fusion at L1-L3, with underlying decompression from L1-L4. The visualized musculature is unremarkable in appearance. Review of the MIP images confirms the above findings. IMPRESSION: 1. Apparent subacute or chronic pulmonary embolus within the pulmonary  artery to the left lower lung lobe, extending minimally into segmental branches. RV/LV ratio of 1.1 may reflect right heart strain. Would correlate with the patient's symptoms. 2. Minimal bilateral atelectasis noted. Mild scarring at the lung apices. 3. Diffuse coronary artery calcifications seen. 4. Diffuse aortic atherosclerosis, with diffuse calcification along the right renal artery and superior mesenteric artery. Likely relatively severe luminal narrowing at the proximal superior mesenteric artery. Would correlate for any associated symptoms. Critical Value/emergent results were called by telephone at the time of interpretation on 03/01/2018 at 1:00 am to the ER clinical team, who verbally acknowledged these results. Electronically Signed   By: Garald Balding M.D.   On: 03/01/2018 01:03   Ir Ivc Filter Plmt / S&i /img Guid/mod Sed  Result Date: 03/14/2018 INDICATION: 77 year old with history of  pulmonary embolism and left lower extremity DVT. Patient now has a large abdominal wall hematoma and no longer candidate for anticoagulation. EXAM: IVC FILTER PLACEMENT; IVC VENOGRAM; ULTRASOUND FOR VASCULAR ACCESS Physician: Stephan Minister. Anselm Pancoast, MD MEDICATIONS: None. ANESTHESIA/SEDATION: Fentanyl 25 mcg IV; Versed 0.5 mg IV Moderate Sedation Time:  22 minutes The patient was continuously monitored during the procedure by the interventional radiology nurse under my direct supervision. CONTRAST:  Carbon dioxide FLUOROSCOPY TIME:  Fluoroscopy Time: 7 minutes 6 seconds (327 mGy). COMPLICATIONS: None immediate. PROCEDURE: Informed consent was obtained for an IVC venogram and filter placement. Ultrasound demonstrated a patent right internal jugular vein. Ultrasound images were obtained for documentation. The right side of the neck was prepped and draped in a sterile fashion. Maximal barrier sterile technique was utilized including caps, mask, sterile gowns, sterile gloves, sterile drape, hand hygiene and skin antiseptic. The skin was anesthetized with 1% lidocaine. A 21 gauge needle was directed into the vein with ultrasound guidance and a micropuncture dilator set was placed. A wire was advanced into the IVC. The filter sheath was advanced over the wire into the IVC. An IVC venogram was performed with carbon dioxide. Limited evaluation of the IVC due to carbon dioxide and the lumbar surgical hardware. Therefore, a MPA catheter was used to cannulate bilateral renal veins. Renal venography was also performed with carbon dioxide bilaterally. The location of the renal veins were confidently identified. Fluoroscopic images were obtained for documentation. A Bard Denali filter was deployed in the IVC below the lowest renal vein. Follow-up images were obtained to confirm adequate deployment of the filter. Vascular sheath was removed with manual compression. FINDINGS: IVC was patent. Bilateral renal veins were identified. The filter  was deployed below the lowest renal vein. IMPRESSION: Successful placement of a retrievable IVC filter. PLAN: This IVC filter is potentially retrievable. The patient will be assessed for filter retrieval by Interventional Radiology in approximately 8-12 weeks. Further recommendations regarding filter retrieval, continued surveillance or declaration of device permanence, will be made at that time. Electronically Signed   By: Markus Daft M.D.   On: 03/14/2018 13:03   US Renal  Result Date: 03/15/2018 CLINICAL DATA:  Acute kidney injury. EXAM: RENAL / URINARY TRACT ULTRASOUND COMPLETE COMPARISON:  CT, 03/14/2018. FINDINGS: Right Kidney: Length: 8.6 cm. Diffuse cortical thinning. Increased renal parenchymal echogenicity. There is a 2.6 x 2.0 x 2.3 cm cyst arising from the lower pole. No other masses, no stones and no hydronephrosis. Left Kidney: Length: 9.2 cm. Mild diffuse cortical thinning. Borderline increased renal parenchymal echogenicity. No mass, stone or hydronephrosis. Lower pole partly obscured by overlying bowel gas. Bladder: Decompressed with a Foley catheter.  Not well assessed. Patient  also has ascites and a right pleural effusion. IMPRESSION: 1. Findings consistent with medical renal disease with renal cortical thinning, greater on the right, and increased renal parenchymal echogenicity on the right and borderline increased renal parenchymal echogenicity on the left. 2. No acute findings.  No hydronephrosis. 3. 2.6 cm right lower pole renal cyst. Electronically Signed   By: Lajean Manes M.D.   On: 03/15/2018 15:45   Dg Chest Port 1 View  Result Date: 03/18/2018 CLINICAL DATA:  Dyspnea, bilateral leg and arm swelling, abdominal pain and nausea for the past 3-4 days. History of COPD, CHF, and previous CABG. EXAM: PORTABLE CHEST 1 VIEW COMPARISON:  Portable chest x-ray of March 15, 2018 FINDINGS: The lungs are well-expanded. There is no focal infiltrate. There is no pleural effusion. The cardiac  silhouette is enlarged. The pulmonary vascularity is not engorged. There are post CABG changes. There is calcification in the wall of the thoracic aorta. There are bilateral breast implants exhibiting capsular calcification. IMPRESSION: COPD. Mild cardiomegaly. Previous CABG. No pulmonary edema, pneumonia, or pleural effusion. Thoracic aortic atherosclerosis. Electronically Signed   By: David  Martinique M.D.   On: 03/18/2018 09:15   Dg Chest Port 1 View  Result Date: 03/15/2018 CLINICAL DATA:  Pleural effusion. EXAM: PORTABLE CHEST 1 VIEW COMPARISON:  Radiograph of March 13, 2018. FINDINGS: The heart size and mediastinal contours are within normal limits. Atherosclerosis of thoracic aorta is noted. Status post coronary artery bypass graft. No pneumothorax is noted. Increased right basilar opacity is noted concerning for worsening atelectasis or infiltrate with associated pleural effusion. Status post right shoulder arthroplasty. IMPRESSION: Increased right basilar atelectasis or infiltrate is noted with possible associated pleural effusion. Aortic Atherosclerosis (ICD10-I70.0). Electronically Signed   By: Marijo Conception, M.D.   On: 03/15/2018 07:45   Dg Chest Port 1 View  Result Date: 03/13/2018 CLINICAL DATA:  77 year old with shortness of breath. EXAM: PORTABLE CHEST 1 VIEW COMPARISON:  Chest CT 02/28/2018 and chest radiograph 11/08/2017 FINDINGS: Bilateral breast implants. No focal airspace disease or pulmonary edema. Right shoulder replacement. Atherosclerotic calcifications at the aortic arch. Prior CABG procedure. Sclerosis and degenerative changes at the left shoulder. Surgical hardware in the lumbar spine. Heart size is within normal limits. IMPRESSION: No acute chest findings. Electronically Signed   By: Markus Daft M.D.   On: 03/13/2018 12:52   Dg Abd 2 Views  Result Date: 03/12/2018 CLINICAL DATA:  Lower abdominal pain beginning this morning post vascular surgery EXAM: ABDOMEN - 2 VIEW  COMPARISON:  03/08/2018 FINDINGS: Normal bowel gas pattern. No bowel dilatation or bowel wall thickening. Gas present in rectum. Bones demineralized with evidence of prior lumbar fusion and prior RIGHT hip replacement. Scattered degenerative changes of lower thoracic spine. Surgical clips RIGHT upper quadrant question cholecystectomy. Degenerative changes LEFT hip joint. Question vascular calcification versus 8 mm diameter calculus at upper pole LEFT kidney. IMPRESSION: No acute abnormalities. Electronically Signed   By: Lavonia Dana M.D.   On: 03/12/2018 19:28   Dg Abd Portable 1v  Result Date: 03/16/2018 CLINICAL DATA:  Abdominal pain EXAM: PORTABLE ABDOMEN - 1 VIEW COMPARISON:  None. FINDINGS: There is no bowel dilatation or air-fluid level to suggest bowel obstruction. No free air. There is a total hip replacement on the right as well as postoperative change in the lumbar region. There is a filter in the inferior vena cava. There are calcified breast implants bilaterally. IMPRESSION: No demonstrable bowel obstruction or free air. Electronically Signed   By: Gwyndolyn Saxon  Jasmine December III M.D.   On: 03/16/2018 11:37   Dg Abd Portable 1v  Result Date: 03/08/2018 CLINICAL DATA:  Nausea and vomiting EXAM: PORTABLE ABDOMEN - 1 VIEW COMPARISON:  CT 02/28/2018 FINDINGS: Surgical hardware in the upper lumbar spine. Surgical clips in the right upper quadrant. Nonobstructed bowel-gas pattern. Radiopaque material in the bladder. Probable diverticulum on the right side of the bladder. Status post right hip replacement with fixating screw paralleling the cortex of the right inter pelvis. IMPRESSION: 1. Nonobstructed gas pattern 2. Contrast material within the bladder Electronically Signed   By: Donavan Foil M.D.   On: 03/08/2018 22:30   Ct Angio Abd/pel W/ And/or W/o  Result Date: 03/09/2018 CLINICAL DATA:  Upper abdominal pain and nausea after mesenteric stent placement today. EXAM: CTA ABDOMEN AND PELVIS wITHOUT AND  WITH CONTRAST TECHNIQUE: Multidetector CT imaging of the abdomen and pelvis was performed using the standard protocol during bolus administration of intravenous contrast. Multiplanar reconstructed images and MIPs were obtained and reviewed to evaluate the vascular anatomy. CONTRAST:  100 cc Isovue 370 IV COMPARISON:  Abdomen/pelvis CT 02/28/2018 FINDINGS: VASCULAR Aorta: Moderate atherosclerosis. Normal caliber aorta without aneurysm, dissection, vasculitis or significant stenosis. Celiac: Plaque at the origin causes approximately 50% stenosis. No dissection or vasculitis. Distal branches are patent. SMA: Patent proximal SMA stent with resolved origin stenosis. Calcified distally but patent. No evidence of dissection. No evidence of contrast extravasation. Renals: Calcified plaque at the origins. No dissection or vasculitis. IMA: Patent without evidence of aneurysm, dissection, vasculitis or significant stenosis. Inflow: Patent without evidence of aneurysm, dissection, vasculitis or significant stenosis. Proximal Outflow: Bilateral common femoral and visualized portions of the superficial and profunda femoral arteries are patent without evidence of aneurysm, dissection, vasculitis or significant stenosis. No evidence of pseudoaneurysm formation or active extravasation at the puncture site. Veins: Not well assessed on arterial phase imaging. There is presumed mixing of opacified and non-opacified blood in the mesenteric venous confluence. Review of the MIP images confirms the above findings. NON-VASCULAR Lower chest: Linear atelectasis in both lower lobes. Trace right pleural effusion. Coronary artery calcifications. Peripherally calcified left breast implant partially included. Hepatobiliary: Slight capsular nodularity raises concern for cirrhosis. No discrete focal lesion. Postcholecystectomy with grossly unchanged biliary prominence allowing for arterial phase technique. Pancreas: Parenchymal atrophy. No ductal  dilatation or inflammation. Spleen: Normal in size without focal abnormality. Adrenals/Urinary Tract: Mild left adrenal thickening. Normal right adrenal gland. Excreted IV contrast in both kidneys and urinary bladder from angiogram earlier this day. No hydronephrosis or perinephric edema. Bilateral renal cysts. Stomach/Bowel: Small hiatal hernia. Stomach is nondistended. Presence of intra-abdominal ascites limits bowel evaluation. There is colonic wall thickening involving the ascending colon, hepatic flexure, unlikely proximal transverse colon. No definite small bowel inflammation. Colonic diverticulosis throughout the colon. Lymphatic: No definite adenopathy. No evidence of retroperitoneal hemorrhage. Reproductive: Uterus obscured by right hip arthroplasty. Other: Development of moderate volume abdominopelvic ascites. Fluid may be minimally complex, however does not represent values concerning for hemorrhage. There is mild mesenteric edema. No free air. Small supraumbilical hernia contains fat and small amount of free fluid. Musculoskeletal: Postsurgical change in the lumbar spine, unchanged from prior. Right hip arthroplasty. IMPRESSION: VASCULAR 1. Placement of superior mesenteric artery stent which is widely patent. No evidence of dissection or postprocedural pseudoaneurysm/hematoma. 2. Diffuse aortic and branch atherosclerosis. NON-VASCULAR 1. Interval development of moderate abdominopelvic ascites from prior exam. Fluid is minimally complex but does not represent hemorrhage. 2. Colonic wall thickening involving the cecum, ascending, and  proximal transverse colon. This is new since prior CT. Exact etiology is uncertain. Infectious or inflammatory etiologies are considered, colonic wall edema related to reperfusion could have a similar appearance. No increased density to suggest bowel wall hemorrhage. 3. Colonic diverticulosis without specific findings to suggest diverticulitis. 4. Question of nodular hepatic  contours raises concern for cirrhosis. Recommend correlation with cirrhosis risk factors. Electronically Signed   By: Jeb Levering M.D.   On: 03/09/2018 03:18   Ir Paracentesis  Result Date: 03/10/2018 INDICATION: Recent superior mesenteric artery stent placement. New ascites. Request for diagnostic paracentesis. EXAM: ULTRASOUND GUIDED RIGHT LATERAL ABDOMEN PARACENTESIS MEDICATIONS: 2% lidocaine = 10 mL COMPLICATIONS: None immediate. PROCEDURE: Informed written consent was obtained from the patient after a discussion of the risks, benefits and alternatives to treatment. A timeout was performed prior to the initiation of the procedure. Initial ultrasound scanning demonstrates a small amount of ascites within the right lateral abdomen. The right lateral abdomen was prepped and draped in the usual sterile fashion. 1% lidocaine with epinephrine was used for local anesthesia. Following this, a 19 gauge, 7-cm, Yueh catheter was introduced. An ultrasound image was saved for documentation purposes. The paracentesis was performed. The catheter was removed and a dressing was applied. The patient tolerated the procedure well without immediate post procedural complication. FINDINGS: A total of approximately 100 ml of clear red fluid was removed. Samples were sent to the laboratory as requested by the clinical team. IMPRESSION: Successful ultrasound-guided paracentesis yielding 171mL of peritoneal fluid. Read by: Gareth Eagle, PA-C Electronically Signed   By: Lucrezia Europe M.D.   On: 03/10/2018 11:13    Lab Data:  CBC: Recent Labs  Lab 03/16/18 1600 03/16/18 2208 03/17/18 0430 03/17/18 0757 03/18/18 0318  WBC 18.9* 17.4* 15.9* 16.6* 15.6*  NEUTROABS 15.3* 14.1* 13.0* 13.6* 12.9*  HGB 8.3* 7.9* 7.7* 7.8* 7.9*  HCT 24.5* 23.9* 23.2* 23.6* 24.2*  MCV 86.0 86.6 87.9 87.7 89.3  PLT 129* 121* 125* 119* 161*   Basic Metabolic Panel: Recent Labs  Lab 03/13/18 0332  03/15/18 0534 03/15/18 1450 03/16/18 0407  03/16/18 1132 03/16/18 1600 03/17/18 0430 03/18/18 0318  NA 136   < > 136 135 135 134* 135 136  --   K 3.6   < > 4.3 4.0 4.1 4.1 4.0 3.7  --   CL 109   < > 112* 110 109 110 109 107  --   CO2 20*   < > 15* 16* 16* 15* 17* 22  --   GLUCOSE 119*   < > 163* 160* 154* 144* 153* 133*  --   BUN 6   < > 41* 47* 52* 54* 56* 51*  --   CREATININE 0.68   < > 3.19* 3.22* 3.03* 2.84* 2.72* 2.15*  --   CALCIUM 7.7*   < > 7.5* 7.6* 7.8* 7.7* 7.6* 7.6*  --   MG 2.1  --  2.1  --  2.1  --   --  2.2 2.3  PHOS  --   --  5.3*  --  5.3*  --   --  4.0 3.3   < > = values in this interval not displayed.   GFR: Estimated Creatinine Clearance: 26.7 mL/min (A) (by C-G formula based on SCr of 2.15 mg/dL (H)). Liver Function Tests: No results for input(s): AST, ALT, ALKPHOS, BILITOT, PROT, ALBUMIN in the last 168 hours. Recent Labs  Lab 03/16/18 1132  LIPASE 18   No results for input(s): AMMONIA in  the last 168 hours. Coagulation Profile: Recent Labs  Lab 03/15/18 1328  INR 1.49   Cardiac Enzymes: Recent Labs  Lab 03/13/18 1329 03/13/18 1709 03/14/18 1103  TROPONINI <0.03 <0.03 0.04*   BNP (last 3 results) Recent Labs    08/17/17 1038 09/14/17 0921  PROBNP 612 503   HbA1C: No results for input(s): HGBA1C in the last 72 hours. CBG: Recent Labs  Lab 03/14/18 0324  GLUCAP 177*   Lipid Profile: No results for input(s): CHOL, HDL, LDLCALC, TRIG, CHOLHDL, LDLDIRECT in the last 72 hours. Thyroid Function Tests: No results for input(s): TSH, T4TOTAL, FREET4, T3FREE, THYROIDAB in the last 72 hours. Anemia Panel: No results for input(s): VITAMINB12, FOLATE, FERRITIN, TIBC, IRON, RETICCTPCT in the last 72 hours. Urine analysis:    Component Value Date/Time   COLORURINE AMBER (A) 03/15/2018 1108   APPEARANCEUR CLOUDY (A) 03/15/2018 1108   LABSPEC 1.020 03/15/2018 1108   PHURINE 5.0 03/15/2018 1108   GLUCOSEU NEGATIVE 03/15/2018 1108   HGBUR LARGE (A) 03/15/2018 1108   BILIRUBINUR NEGATIVE  03/15/2018 1108   KETONESUR NEGATIVE 03/15/2018 1108   PROTEINUR 100 (A) 03/15/2018 1108   UROBILINOGEN 1.0 03/30/2011 0901   NITRITE NEGATIVE 03/15/2018 1108   LEUKOCYTESUR SMALL (A) 03/15/2018 1108     Ripudeep Rai M.D. [facility] 03/18/2018, 11:14 AM  Pager: [phone] Between 7am to 7pm - call Pager - [phone]  After 7pm go to [email or web address] - password [email or web address]  Call night coverage person covering after 7pm

## 2018-03-18 NOTE — Progress Notes (Addendum)
  Progress Note    03/18/2018 9:32 AM 10 Days Post-Op  Subjective:  Says she feels terrible  Tm 99.1  Vitals:   03/18/18 0830 03/18/18 0924  BP:    Pulse:    Resp:    Temp: 99.1 F (37.3 C)   SpO2:  97%    Physical Exam: Abdomen:  Soft to palpation; she does have some diffuse tenderness  CBC    Component Value Date/Time   WBC 15.6 (H) 03/18/2018 0318   RBC 2.71 (L) 03/18/2018 0318   HGB 7.9 (L) 03/18/2018 0318   HGB 12.9 08/17/2017 1038   HCT 24.2 (L) 03/18/2018 0318   HCT 38.2 08/17/2017 1038   PLT 120 (L) 03/18/2018 0318   PLT 237 08/17/2017 1038   MCV 89.3 03/18/2018 0318   MCV 88 08/17/2017 1038   MCH 29.2 03/18/2018 0318   MCHC 32.6 03/18/2018 0318   RDW 18.4 (H) 03/18/2018 0318   RDW 15.7 (H) 08/17/2017 1038   LYMPHSABS 0.8 03/18/2018 0318   MONOABS 1.9 (H) 03/18/2018 0318   EOSABS 0.0 03/18/2018 0318   BASOSABS 0.0 03/18/2018 0318    BMET    Component Value Date/Time   NA 136 03/17/2018 0430   NA 144 09/14/2017 0921   K 3.7 03/17/2018 0430   CL 107 03/17/2018 0430   CO2 22 03/17/2018 0430   GLUCOSE 133 (H) 03/17/2018 0430   BUN 51 (H) 03/17/2018 0430   BUN 12 09/14/2017 0921   CREATININE 2.15 (H) 03/17/2018 0430   CALCIUM 7.6 (L) 03/17/2018 0430   GFRNONAA 21 (L) 03/17/2018 0430   GFRAA 24 (L) 03/17/2018 0430    INR    Component Value Date/Time   INR 1.49 03/15/2018 1328     Intake/Output Summary (Last 24 hours) at 03/18/2018 0932 Last data filed at 03/18/2018 0800 Gross per 24 hour  Intake 2674.33 ml  Output 975 ml  Net 1699.33 ml     Assessment:  77 y.o. female is s/p:  sma stenting for chronic mesenteric ischemia. Recent aki with rectus sheath hematoma. Aspirin when ok from bleeding risk to prevent stent thrombosis.  10 Days Post-Op  Plan: -pt with N/V this am  -renal function improving yesterday and leukocytosis down again today.   -acute anemia is stable today -DVT prophylaxis:  Continues to be held   Leontine Locket,  [facility] [phone] 03/18/2018 9:32 AM   I have independently interviewed and examined patient and agree with PA assessment and plan above.   Kaina Orengo C. Donzetta Matters, MD [facility] Office: [phone] Pager: [phone]

## 2018-03-19 LAB — CBC
HCT: 26 % — ABNORMAL LOW (ref 36.0–46.0)
Hemoglobin: 8.2 g/dL — ABNORMAL LOW (ref 12.0–15.0)
MCH: 28.7 pg (ref 26.0–34.0)
MCHC: 31.5 g/dL (ref 30.0–36.0)
MCV: 90.9 fL (ref 78.0–100.0)
Platelets: 134 10*3/uL — ABNORMAL LOW (ref 150–400)
RBC: 2.86 MIL/uL — ABNORMAL LOW (ref 3.87–5.11)
RDW: 19 % — ABNORMAL HIGH (ref 11.5–15.5)
WBC: 15.3 10*3/uL — ABNORMAL HIGH (ref 4.0–10.5)

## 2018-03-19 LAB — CULTURE, BLOOD (ROUTINE X 2)
Culture: NO GROWTH
Culture: NO GROWTH
Special Requests: ADEQUATE

## 2018-03-19 LAB — BASIC METABOLIC PANEL
Anion gap: 8 (ref 5–15)
BUN: 35 mg/dL — ABNORMAL HIGH (ref 6–20)
CO2: 20 mmol/L — ABNORMAL LOW (ref 22–32)
Calcium: 7.9 mg/dL — ABNORMAL LOW (ref 8.9–10.3)
Chloride: 108 mmol/L (ref 101–111)
Creatinine, Ser: 0.99 mg/dL (ref 0.44–1.00)
GFR calc Af Amer: >60 mL/min (ref >60)
GFR calc non Af Amer: 54 mL/min — ABNORMAL LOW (ref >60)
Glucose, Bld: 107 mg/dL — ABNORMAL HIGH (ref 65–99)
Potassium: 3.7 mmol/L (ref 3.5–5.1)
Sodium: 136 mmol/L (ref 135–145)

## 2018-03-19 LAB — PHOSPHORUS: Phosphorus: 2.9 mg/dL (ref 2.5–4.6)

## 2018-03-19 LAB — MAGNESIUM: Magnesium: 2.3 mg/dL (ref 1.7–2.4)

## 2018-03-19 MED ORDER — FUROSEMIDE 20 MG PO TABS
20.0000 mg | ORAL_TABLET | Freq: Every day | ORAL | Status: DC
  Administered 2018-03-19 – 2018-03-20 (×2): 20 mg via ORAL
  Filled 2018-03-19 (×2): qty 1

## 2018-03-19 NOTE — Plan of Care (Signed)
Pt stable throughout the shift. Pt up to chair per goal set for the day. VSS. Family with patient

## 2018-03-19 NOTE — Plan of Care (Signed)
  Problem: Nutrition: Goal: Adequate nutrition will be maintained Outcome: Progressing   

## 2018-03-19 NOTE — Progress Notes (Signed)
[facility]                                                                              Patient Demographics  [name], is a 77 y.o. female, DOB - 11-18-1941, [place] date - 02/28/2018   Admitting Physician Etta Quill, DO  Outpatient Primary MD for the patient is [name], MD  Outpatient specialists:   LOS - 18  days   Medical records reviewed and are as summarized below:    Chief Complaint  Patient presents with  . Nausea       Brief summary   77 y/o female with CAD, COPD, prior PE, history of small bowel AVMs and had been off anticoagulants and since February 2018 was admitted on 3/10 with nausea, weakness and dyspnea on exertion, acute respiratory failure with hypoxia.  Workup showed multiple subacute pulmonary embolus on CT scan as well as large acute DVT in the left femoral vein.  She was incidentally found to have severe stenosis of superior mesenteric artery.  Patient was started on heparin drip, admitted to hospitalist service, had stenting of SMA on 3/18.  Since the stent placement, she had worsening abdominal pain, nausea and vomiting.  CTA abdomen and pelvis on 3/19 revealed patent SMA stent with moderate abdominopelvic ascites, colonic wall thickening.  GI was consulted, symptoms thought to be from ischemic colitis.  However hemoglobin continued to drop and on 3/24 AM, patient had a rapid response with hypotension, acute hypoxic respiratory failure, severe abdominal pain.  Repeat CTabdomen pelvis on 3/24 showed a new large anterior abdominal wall hematoma measuring 16.9 x 11.9  X 12.4 cm.  Hemoglobin 6.6.  Heparin drip, Plavix were discontinued, patient was transfused 2 units packed RBC and transferred to ICU.  She was placed on vasopressors per IVC filter placed on 3/24.  Patient's creatinine function continued to trend up, renal ultrasound did not show any obstruction or hydronephrosis, possibly due to hypoperfusion/ATN.  For  the new onset ascites, patient underwent paracentesis on 3/20 which showed SAAG >1.1 Vascular surgery following patient, recommended starting aspirin once hemoglobin has remained stable.   Assessment & Plan    Principal Problem: Right-sided ischemic colitis, superior mesenteric artery stenosis status post stent placement to SMA on 3/18.  -Nausea vomiting improved today, abdominal pain improving. -Continue antiemetics as needed, advance to full liquid diet today -Vascular surgery following, will follow recommendations regarding timing of reinitiation of anticoagulation or at least start aspirin -Antibiotics completed 3/27 however low threshold for initiating if fevers or worsening leukocytosis  Active Problems: Acute hypoxic respiratory failure with pulmonary emboli, left femoral DVT, COPD -Status post IVC filter on 3/24 -Wean O2 as tolerated -Patient complaining of dyspnea/shallow breathing, chest x-ray today showed COPD, mild cardiomegaly but no pulmonary edema or pneumonia or pleural effusion -BNP 362, 18 L positive, placed on low-dose Lasix oral 20 mg daily, BP soft, will not be able to tolerate higher dose -Continue duo nebs scheduled and as needed  Acute on chronic blood loss anemia, hemorrhagic shock, rectus sheath hematoma, abdominal pain -Hemoglobin holding between 7.8 to 7.9 -Hemoglobin currently stable, 8.2 today -  Hemorrhagic shock resolved, had briefly required vasopressors  Acute kidney injury with non-anion gap metabolic acidosis -Possibly due to hypoperfusion, ATN and contrast.  Baseline creatinine 0.7-1 -Creatinine improving today, peaked at 3.2, improved to 0.9 today -Renal ultrasound did not show any obstruction or hydronephrosis  New ascites status post paracentesis 3/20 - SAAG >1.1, felt inflammatory, CT scan with cirrhosis, ischemic colitis -GI following  Code Status: Partial DVT Prophylaxis: SCD Family Communication: Discussed in detail with the patient, all  imaging results, lab results explained to the patient   Disposition Plan: Continue monitoring in stepdown today  Time Spent in minutes 45 minutes  Procedures:   3/18 SMA stenosis status post stent CT ABD /Pelvis 3/19>Interval development of moderate abdominopelvic ascites,Colonic wall thickening involving the cecum, ascending, proximal transverse colon 3/20 status post paracentesis with 100 cc removed CT abd /pelvis 3/24>>New, large anterior abdominal wall hematoma measuring 16.9 x 11.9x 12.4 cm.2. Small volume ascites with nodular appearance of the liver concerning for cirrhosis.. Medium-sized right pleural effusion 3/24: Retrievable IVC filter placed  Consultants:   PCCM Vascular surgery IR Gastroenterology  Antimicrobials:      Medications  Scheduled Meds: . aspirin EC  81 mg Oral Daily  . chlorhexidine  15 mL Mouth Rinse BID  . cholecalciferol  1,000 Units Oral Daily  . citalopram  30 mg Oral Daily  . docusate sodium  100 mg Oral BID  . feeding supplement (ENSURE ENLIVE)  237 mL Oral TID BM  . ferrous sulfate  325 mg Oral Q breakfast  . ipratropium-albuterol  3 mL Inhalation BID  . lidocaine  1 patch Transdermal Q24H  . mouth rinse  15 mL Mouth Rinse q12n4p  . pantoprazole  40 mg Oral Daily  . pilocarpine  1 drop Both Eyes BID  . pramipexole  0.25 mg Oral QHS  . senna  1 tablet Oral QHS  . sodium chloride flush  3 mL Intravenous Q12H  . sodium chloride flush  3 mL Intravenous Q12H   Continuous Infusions: . lactated ringers 50 mL/hr at 03/19/18 0800  . sodium chloride     PRN Meds:.acetaminophen **OR** acetaminophen, HYDROmorphone (DILAUDID) injection, ipratropium-albuterol, metoCLOPramide (REGLAN) injection, oxyCODONE, prochlorperazine, sodium chloride flush, sodium chloride flush, traZODone   Antibiotics   Anti-infectives (From admission, onward)   Start     Dose/Rate Route Frequency Ordered Stop   03/16/18 0600  ciprofloxacin (CIPRO) IVPB 200 mg   Status:  Discontinued     200 mg 100 mL/hr over 60 Minutes Intravenous Every 24 hours 03/15/18 0933 03/17/18 1223   03/15/18 0600  ciprofloxacin (CIPRO) IVPB 400 mg  Status:  Discontinued     400 mg 200 mL/hr over 60 Minutes Intravenous Every 24 hours 03/14/18 1259 03/15/18 0933   03/13/18 1800  ciprofloxacin (CIPRO) IVPB 400 mg  Status:  Discontinued     400 mg 200 mL/hr over 60 Minutes Intravenous Every 12 hours 03/13/18 1711 03/14/18 1259   03/13/18 1800  metroNIDAZOLE (FLAGYL) IVPB 500 mg  Status:  Discontinued     500 mg 100 mL/hr over 60 Minutes Intravenous Every 8 hours 03/13/18 1711 03/16/18 1100   03/10/18 1230  metroNIDAZOLE (FLAGYL) IVPB 500 mg  Status:  Discontinued     500 mg 100 mL/hr over 60 Minutes Intravenous Every 8 hours 03/10/18 1208 03/10/18 1815   03/10/18 1230  ciprofloxacin (CIPRO) IVPB 400 mg  Status:  Discontinued     400 mg 200 mL/hr over 60 Minutes Intravenous Every 12 hours 03/10/18 1208  03/10/18 1815        Subjective:   Clay Menser was seen and examined today.  Feels a lot better today, denies any nausea or vomiting.  Abdominal pain is also improving.  Afebrile.  No chest pain. Denies any new weakness, numbness or tingling.  No diarrhea.   Objective:   Vitals:   03/19/18 0815 03/19/18 0900 03/19/18 1000 03/19/18 1149  BP:  (!) 117/50 (!) 146/60   Pulse:  90 95   Resp:  17 17   Temp:    98.6 F (37 C)  TempSrc:    Oral  SpO2: 98% 100% 98%   Weight:      Height:        Intake/Output Summary (Last 24 hours) at 03/19/2018 1314 Last data filed at 03/19/2018 0800 Gross per 24 hour  Intake 1156 ml  Output 1310 ml  Net -154 ml     Wt Readings from Last 3 Encounters:  03/19/18 97 kg (213 lb 13.5 oz)  11/19/17 84.1 kg (185 lb 6.4 oz)  09/14/17 78.6 kg (173 lb 3.2 oz)     Exam   General: Alert and oriented x 3, NAD  Eyes:   HEENT:   Cardiovascular: S1 S2 auscultated, RRR. 2+ pedal edema b/l  Respiratory: Clear to auscultation  bilaterally, no wheezing, rales or rhonchi  Gastrointestinal: Soft, mild diffuse tenderness, slightly distendedr, + bowel sounds  Ext: 2+ pitting edema  Neuro: no new deficit  Musculoskeletal: No digital cyanosis, clubbing  Skin: No rashes  Psych: Normal affect and demeanor, alert and oriented x3    Data Reviewed:  I have personally reviewed following labs and imaging studies  Micro Results Recent Results (from the past 240 hour(s))  Gram stain     Status: None   Collection Time: 03/10/18 10:15 AM  Result Value Ref Range Status   Specimen Description PERITONEAL  Final   Special Requests NONE  Final   Gram Stain   Final    RARE WBC PRESENT, PREDOMINANTLY MONONUCLEAR NO ORGANISMS SEEN Performed at [facility], [address], [place], [place] [address]    Report Status 03/10/2018 FINAL  Final  Culture, body fluid-bottle     Status: None   Collection Time: 03/10/18 10:15 AM  Result Value Ref Range Status   Specimen Description PERITONEAL  Final   Special Requests NONE  Final   Culture   Final    NO GROWTH 5 DAYS Performed at [facility] [address] [place], [place] [address]    Report Status 03/15/2018 FINAL  Final  Culture, Urine     Status: Abnormal   Collection Time: 03/13/18  4:42 PM  Result Value Ref Range Status   Specimen Description URINE, CATHETERIZED  Final   Special Requests NONE  Final   Culture (A)  Final    <10,000 COLONIES/mL INSIGNIFICANT GROWTH Performed at [facility], [place] [address], [place], [place] [address]    Report Status 03/14/2018 FINAL  Final  Culture, blood (Routine X 2) w Reflex to ID Panel     Status: None (Preliminary result)   Collection Time: 03/14/18 11:15 AM  Result Value Ref Range Status   Specimen Description BLOOD LEFT HAND  Final   Special Requests   Final    BOTTLES DRAWN AEROBIC ONLY Blood Culture adequate volume   Culture   Final    NO GROWTH 4 DAYS Performed at [facility], [place]  [address]  [place], [place] [address]    Report Status PENDING  Incomplete  Culture, blood (Routine X 2) w Reflex to ID Panel     Status: None (Preliminary result)   Collection Time: 03/14/18 11:17 AM  Result Value Ref Range Status   Specimen Description BLOOD LEFT HAND  Final   Special Requests   Final    BOTTLES DRAWN AEROBIC ONLY Blood Culture results may not be optimal due to an inadequate volume of blood received in culture bottles   Culture   Final    NO GROWTH 4 DAYS Performed at [facility], [place] [address], [place], [place] [address]    Report Status PENDING  Incomplete    Radiology Reports Ct Abdomen Pelvis Wo Contrast  Addendum Date: 03/14/2018   ADDENDUM REPORT: 03/14/2018 04:14 ADDENDUM: These results were called by telephone at the time of interpretation on 03/14/2018 at 4:04 am to [facility] , who verbally acknowledged these results. Electronically Signed   By: Ulyses Jarred M.D.   On: 03/14/2018 04:14   Result Date: 03/14/2018 CLINICAL DATA:  Abdominal pain and fever EXAM: CT ABDOMEN AND PELVIS WITHOUT CONTRAST TECHNIQUE: Multidetector CT imaging of the abdomen and pelvis was performed following the standard protocol without IV contrast. COMPARISON:  CT abdomen pelvis 03/09/2018 FINDINGS: Examination is degraded by streak artifact from external patient devices and from the arms down positioning of the patient, creating multiple streak artifacts and decreasing sensitivity and specificity of this scan. Lower chest: Right pleural effusion and associated atelectasis. Hepatobiliary: Right upper quadrant ascites. Nodular appearance of the liver. No focal liver lesion. Status post cholecystectomy. Pancreas: Atrophic appearance of the pancreas. Spleen: Normal. Adrenals/Urinary Tract: --Adrenal glands: Normal. --Right kidney/ureter: No hydronephrosis, perinephric stranding or nephrolithiasis. No obstructing ureteral stones. --Left kidney/ureter: No hydronephrosis, perinephric stranding or  nephrolithiasis. No obstructing ureteral stones. --Urinary bladder: Normal appearance for the degree of distention. Stomach/Bowel: --Stomach/Duodenum: No hiatal hernia or other gastric abnormality. Normal duodenal course. --Small bowel: No dilatation or inflammation. --Colon: Diffuse colonic diverticulosis without acute inflammation. --Appendix: Normal. Vascular/Lymphatic: Atherosclerotic calcification is present within the non-aneurysmal abdominal aorta, without hemodynamically significant stenosis. No abdominal or pelvic lymphadenopathy. Reproductive: Pelvis largely obscured by streak artifact. Musculoskeletal. Right total hip arthroplasty. L1-L3 posterior spinal fusion. Other: Large anterior abdominal wall hematoma measuring up to 16.9 x 11.9 x 12.4 cm. This is new compared to the recent study from 03/09/2018. This exerts mass effect on the intra-abdominal contents. IMPRESSION: 1. New, large anterior abdominal wall hematoma measuring 16.9 x 11.9 x 12.4 cm. 2. Small volume ascites with nodular appearance of the liver concerning for cirrhosis. 3. Medium-sized right pleural effusion with associated atelectasis. 4. Diffuse colonic diverticulosis without acute inflammation. 5.  Aortic Atherosclerosis (ICD10-I70.0). Electronically Signed: By: Ulyses Jarred M.D. On: 03/14/2018 03:45   Ct Angio Chest Pe W/cm &/or Wo Cm  Result Date: 03/01/2018 CLINICAL DATA:  Subacute onset of nausea. Generalized abdominal pain. Mild generalized chest pain and shortness of breath. EXAM: CT ANGIOGRAPHY CHEST CT ABDOMEN AND PELVIS WITH CONTRAST TECHNIQUE: Multidetector CT imaging of the chest was performed using the standard protocol during bolus administration of intravenous contrast. Multiplanar CT image reconstructions and MIPs were obtained to evaluate the vascular anatomy. Multidetector CT imaging of the abdomen and pelvis was performed using the standard protocol during bolus administration of intravenous contrast. CONTRAST:  80  mL of Isovue 370 IV contrast COMPARISON:  CT of the abdomen and pelvis performed 11/08/2017, and CT of the thoracic spine performed 07/26/2014  FINDINGS: CTA CHEST FINDINGS Cardiovascular: There appears to be subacute or chronic pulmonary embolus within the pulmonary artery to the left lower lobe, extending minimally into segmental branches. No additional pulmonary embolus is identified. The RV/LV ratio of 1.1 may reflect right heart strain. Diffuse coronary artery calcifications are seen. Diffuse calcification is noted along the aortic arch and proximal great vessels. The heart remains normal in size. The patient is status post median sternotomy. There is a chronic fracture of the inferior-most sternal wire. Mediastinum/Nodes: The mediastinum is otherwise unremarkable in appearance. No mediastinal lymphadenopathy is seen. No pericardial effusion is identified. The thyroid gland is grossly unremarkable. No axillary lymphadenopathy is seen. Lungs/Pleura: Minimal bilateral atelectasis is noted. The lungs are otherwise clear. Mild scarring is noted at the lung apices. No significant focal consolidation, pleural effusion or pneumothorax is seen. No masses are identified. Musculoskeletal: No acute osseous abnormalities are identified. The patient's right shoulder arthroplasty is incompletely imaged but appears grossly unremarkable. The visualized musculature is unremarkable in appearance. Diffuse calcification is noted about the patient's bilateral breast implants. Review of the MIP images confirms the above findings. CT ABDOMEN and PELVIS FINDINGS Hepatobiliary: The liver is unremarkable in appearance. The patient is status post cholecystectomy, with clips noted at the gallbladder fossa. There is corresponding distention of the intrahepatic biliary ducts and common bile duct. Pancreas: The pancreas is atrophic and grossly unremarkable in appearance. Spleen: The spleen is unremarkable in appearance. Adrenals/Urinary  Tract: Minimal calcification is noted at the left adrenal gland, likely adjacent to a small underlying adrenal lesion. The right adrenal gland is unremarkable. A right renal cyst is again noted, better characterized on the prior study. The kidneys are otherwise unremarkable. There is no evidence of hydronephrosis. No perinephric stranding is seen. Stomach/Bowel: The stomach is unremarkable in appearance. The small bowel is within normal limits. The appendix is normal in caliber, without evidence of appendicitis. The colon is unremarkable in appearance. Vascular/Lymphatic: Diffuse calcification is seen along the abdominal aorta and its branches, including along the right renal artery and superior mesenteric artery. There is likely relatively severe luminal narrowing at the proximal superior mesenteric artery. The abdominal aorta is otherwise grossly unremarkable. The inferior vena cava is grossly unremarkable. No retroperitoneal lymphadenopathy is seen. No pelvic sidewall lymphadenopathy is identified. Reproductive: The bladder is mildly distended and grossly unremarkable. The uterus is unremarkable in appearance. The ovaries are grossly symmetric. No suspicious adnexal masses are seen. Other: No additional soft tissue abnormalities are seen. Musculoskeletal: No acute osseous abnormalities are identified. The patient's right hip arthroplasty is incompletely imaged, but appears grossly unremarkable. The patient is status post lumbar spinal fusion at L1-L3, with underlying decompression from L1-L4. The visualized musculature is unremarkable in appearance. Review of the MIP images confirms the above findings. IMPRESSION: 1. Apparent subacute or chronic pulmonary embolus within the pulmonary artery to the left lower lung lobe, extending minimally into segmental branches. RV/LV ratio of 1.1 may reflect right heart strain. Would correlate with the patient's symptoms. 2. Minimal bilateral atelectasis noted. Mild scarring at  the lung apices. 3. Diffuse coronary artery calcifications seen. 4. Diffuse aortic atherosclerosis, with diffuse calcification along the right renal artery and superior mesenteric artery. Likely relatively severe luminal narrowing at the proximal superior mesenteric artery. Would correlate for any associated symptoms. Critical Value/emergent results were called by telephone at the time of interpretation on 03/01/2018 at 1:00 am to the ER clinical team, who verbally acknowledged these results. Electronically Signed   By: Garald Balding  M.D.   On: 03/01/2018 01:03   Ct Abdomen Pelvis W Contrast  Result Date: 03/01/2018 CLINICAL DATA:  Subacute onset of nausea. Generalized abdominal pain. Mild generalized chest pain and shortness of breath. EXAM: CT ANGIOGRAPHY CHEST CT ABDOMEN AND PELVIS WITH CONTRAST TECHNIQUE: Multidetector CT imaging of the chest was performed using the standard protocol during bolus administration of intravenous contrast. Multiplanar CT image reconstructions and MIPs were obtained to evaluate the vascular anatomy. Multidetector CT imaging of the abdomen and pelvis was performed using the standard protocol during bolus administration of intravenous contrast. CONTRAST:  80 mL of Isovue 370 IV contrast COMPARISON:  CT of the abdomen and pelvis performed 11/08/2017, and CT of the thoracic spine performed 07/26/2014 FINDINGS: CTA CHEST FINDINGS Cardiovascular: There appears to be subacute or chronic pulmonary embolus within the pulmonary artery to the left lower lobe, extending minimally into segmental branches. No additional pulmonary embolus is identified. The RV/LV ratio of 1.1 may reflect right heart strain. Diffuse coronary artery calcifications are seen. Diffuse calcification is noted along the aortic arch and proximal great vessels. The heart remains normal in size. The patient is status post median sternotomy. There is a chronic fracture of the inferior-most sternal wire. Mediastinum/Nodes:  The mediastinum is otherwise unremarkable in appearance. No mediastinal lymphadenopathy is seen. No pericardial effusion is identified. The thyroid gland is grossly unremarkable. No axillary lymphadenopathy is seen. Lungs/Pleura: Minimal bilateral atelectasis is noted. The lungs are otherwise clear. Mild scarring is noted at the lung apices. No significant focal consolidation, pleural effusion or pneumothorax is seen. No masses are identified. Musculoskeletal: No acute osseous abnormalities are identified. The patient's right shoulder arthroplasty is incompletely imaged but appears grossly unremarkable. The visualized musculature is unremarkable in appearance. Diffuse calcification is noted about the patient's bilateral breast implants. Review of the MIP images confirms the above findings. CT ABDOMEN and PELVIS FINDINGS Hepatobiliary: The liver is unremarkable in appearance. The patient is status post cholecystectomy, with clips noted at the gallbladder fossa. There is corresponding distention of the intrahepatic biliary ducts and common bile duct. Pancreas: The pancreas is atrophic and grossly unremarkable in appearance. Spleen: The spleen is unremarkable in appearance. Adrenals/Urinary Tract: Minimal calcification is noted at the left adrenal gland, likely adjacent to a small underlying adrenal lesion. The right adrenal gland is unremarkable. A right renal cyst is again noted, better characterized on the prior study. The kidneys are otherwise unremarkable. There is no evidence of hydronephrosis. No perinephric stranding is seen. Stomach/Bowel: The stomach is unremarkable in appearance. The small bowel is within normal limits. The appendix is normal in caliber, without evidence of appendicitis. The colon is unremarkable in appearance. Vascular/Lymphatic: Diffuse calcification is seen along the abdominal aorta and its branches, including along the right renal artery and superior mesenteric artery. There is likely  relatively severe luminal narrowing at the proximal superior mesenteric artery. The abdominal aorta is otherwise grossly unremarkable. The inferior vena cava is grossly unremarkable. No retroperitoneal lymphadenopathy is seen. No pelvic sidewall lymphadenopathy is identified. Reproductive: The bladder is mildly distended and grossly unremarkable. The uterus is unremarkable in appearance. The ovaries are grossly symmetric. No suspicious adnexal masses are seen. Other: No additional soft tissue abnormalities are seen. Musculoskeletal: No acute osseous abnormalities are identified. The patient's right hip arthroplasty is incompletely imaged, but appears grossly unremarkable. The patient is status post lumbar spinal fusion at L1-L3, with underlying decompression from L1-L4. The visualized musculature is unremarkable in appearance. Review of the MIP images confirms the above  findings. IMPRESSION: 1. Apparent subacute or chronic pulmonary embolus within the pulmonary artery to the left lower lung lobe, extending minimally into segmental branches. RV/LV ratio of 1.1 may reflect right heart strain. Would correlate with the patient's symptoms. 2. Minimal bilateral atelectasis noted. Mild scarring at the lung apices. 3. Diffuse coronary artery calcifications seen. 4. Diffuse aortic atherosclerosis, with diffuse calcification along the right renal artery and superior mesenteric artery. Likely relatively severe luminal narrowing at the proximal superior mesenteric artery. Would correlate for any associated symptoms. Critical Value/emergent results were called by telephone at the time of interpretation on 03/01/2018 at 1:00 am to the ER clinical team, who verbally acknowledged these results. Electronically Signed   By: Garald Balding M.D.   On: 03/01/2018 01:03   Ir Ivc Filter Plmt / S&i /img Guid/mod Sed  Result Date: 03/14/2018 INDICATION: 77 year old with history of pulmonary embolism and left lower extremity DVT. Patient  now has a large abdominal wall hematoma and no longer candidate for anticoagulation. EXAM: IVC FILTER PLACEMENT; IVC VENOGRAM; ULTRASOUND FOR VASCULAR ACCESS Physician: Stephan Minister. Anselm Pancoast, MD MEDICATIONS: None. ANESTHESIA/SEDATION: Fentanyl 25 mcg IV; Versed 0.5 mg IV Moderate Sedation Time:  22 minutes The patient was continuously monitored during the procedure by the interventional radiology nurse under my direct supervision. CONTRAST:  Carbon dioxide FLUOROSCOPY TIME:  Fluoroscopy Time: 7 minutes 6 seconds (327 mGy). COMPLICATIONS: None immediate. PROCEDURE: Informed consent was obtained for an IVC venogram and filter placement. Ultrasound demonstrated a patent right internal jugular vein. Ultrasound images were obtained for documentation. The right side of the neck was prepped and draped in a sterile fashion. Maximal barrier sterile technique was utilized including caps, mask, sterile gowns, sterile gloves, sterile drape, hand hygiene and skin antiseptic. The skin was anesthetized with 1% lidocaine. A 21 gauge needle was directed into the vein with ultrasound guidance and a micropuncture dilator set was placed. A wire was advanced into the IVC. The filter sheath was advanced over the wire into the IVC. An IVC venogram was performed with carbon dioxide. Limited evaluation of the IVC due to carbon dioxide and the lumbar surgical hardware. Therefore, a MPA catheter was used to cannulate bilateral renal veins. Renal venography was also performed with carbon dioxide bilaterally. The location of the renal veins were confidently identified. Fluoroscopic images were obtained for documentation. A Bard Denali filter was deployed in the IVC below the lowest renal vein. Follow-up images were obtained to confirm adequate deployment of the filter. Vascular sheath was removed with manual compression. FINDINGS: IVC was patent. Bilateral renal veins were identified. The filter was deployed below the lowest renal vein. IMPRESSION:  Successful placement of a retrievable IVC filter. PLAN: This IVC filter is potentially retrievable. The patient will be assessed for filter retrieval by Interventional Radiology in approximately 8-12 weeks. Further recommendations regarding filter retrieval, continued surveillance or declaration of device permanence, will be made at that time. Electronically Signed   By: Markus Daft M.D.   On: 03/14/2018 13:03   US Renal  Result Date: 03/15/2018 CLINICAL DATA:  Acute kidney injury. EXAM: RENAL / URINARY TRACT ULTRASOUND COMPLETE COMPARISON:  CT, 03/14/2018. FINDINGS: Right Kidney: Length: 8.6 cm. Diffuse cortical thinning. Increased renal parenchymal echogenicity. There is a 2.6 x 2.0 x 2.3 cm cyst arising from the lower pole. No other masses, no stones and no hydronephrosis. Left Kidney: Length: 9.2 cm. Mild diffuse cortical thinning. Borderline increased renal parenchymal echogenicity. No mass, stone or hydronephrosis. Lower pole partly obscured by overlying bowel  gas. Bladder: Decompressed with a Foley catheter.  Not well assessed. Patient also has ascites and a right pleural effusion. IMPRESSION: 1. Findings consistent with medical renal disease with renal cortical thinning, greater on the right, and increased renal parenchymal echogenicity on the right and borderline increased renal parenchymal echogenicity on the left. 2. No acute findings.  No hydronephrosis. 3. 2.6 cm right lower pole renal cyst. Electronically Signed   By: Lajean Manes M.D.   On: 03/15/2018 15:45   Dg Chest Port 1 View  Result Date: 03/18/2018 CLINICAL DATA:  Dyspnea, bilateral leg and arm swelling, abdominal pain and nausea for the past 3-4 days. History of COPD, CHF, and previous CABG. EXAM: PORTABLE CHEST 1 VIEW COMPARISON:  Portable chest x-ray of March 15, 2018 FINDINGS: The lungs are well-expanded. There is no focal infiltrate. There is no pleural effusion. The cardiac silhouette is enlarged. The pulmonary vascularity is not  engorged. There are post CABG changes. There is calcification in the wall of the thoracic aorta. There are bilateral breast implants exhibiting capsular calcification. IMPRESSION: COPD. Mild cardiomegaly. Previous CABG. No pulmonary edema, pneumonia, or pleural effusion. Thoracic aortic atherosclerosis. Electronically Signed   By: David  Martinique M.D.   On: 03/18/2018 09:15   Dg Chest Port 1 View  Result Date: 03/15/2018 CLINICAL DATA:  Pleural effusion. EXAM: PORTABLE CHEST 1 VIEW COMPARISON:  Radiograph of March 13, 2018. FINDINGS: The heart size and mediastinal contours are within normal limits. Atherosclerosis of thoracic aorta is noted. Status post coronary artery bypass graft. No pneumothorax is noted. Increased right basilar opacity is noted concerning for worsening atelectasis or infiltrate with associated pleural effusion. Status post right shoulder arthroplasty. IMPRESSION: Increased right basilar atelectasis or infiltrate is noted with possible associated pleural effusion. Aortic Atherosclerosis (ICD10-I70.0). Electronically Signed   By: Marijo Conception, M.D.   On: 03/15/2018 07:45   Dg Chest Port 1 View  Result Date: 03/13/2018 CLINICAL DATA:  77 year old with shortness of breath. EXAM: PORTABLE CHEST 1 VIEW COMPARISON:  Chest CT 02/28/2018 and chest radiograph 11/08/2017 FINDINGS: Bilateral breast implants. No focal airspace disease or pulmonary edema. Right shoulder replacement. Atherosclerotic calcifications at the aortic arch. Prior CABG procedure. Sclerosis and degenerative changes at the left shoulder. Surgical hardware in the lumbar spine. Heart size is within normal limits. IMPRESSION: No acute chest findings. Electronically Signed   By: Markus Daft M.D.   On: 03/13/2018 12:52   Dg Abd 2 Views  Result Date: 03/12/2018 CLINICAL DATA:  Lower abdominal pain beginning this morning post vascular surgery EXAM: ABDOMEN - 2 VIEW COMPARISON:  03/08/2018 FINDINGS: Normal bowel gas pattern. No  bowel dilatation or bowel wall thickening. Gas present in rectum. Bones demineralized with evidence of prior lumbar fusion and prior RIGHT hip replacement. Scattered degenerative changes of lower thoracic spine. Surgical clips RIGHT upper quadrant question cholecystectomy. Degenerative changes LEFT hip joint. Question vascular calcification versus 8 mm diameter calculus at upper pole LEFT kidney. IMPRESSION: No acute abnormalities. Electronically Signed   By: Lavonia Dana M.D.   On: 03/12/2018 19:28   Dg Abd Portable 1v  Result Date: 03/16/2018 CLINICAL DATA:  Abdominal pain EXAM: PORTABLE ABDOMEN - 1 VIEW COMPARISON:  None. FINDINGS: There is no bowel dilatation or air-fluid level to suggest bowel obstruction. No free air. There is a total hip replacement on the right as well as postoperative change in the lumbar region. There is a filter in the inferior vena cava. There are calcified breast implants bilaterally. IMPRESSION: No  demonstrable bowel obstruction or free air. Electronically Signed   By: Lowella Grip III M.D.   On: 03/16/2018 11:37   Dg Abd Portable 1v  Result Date: 03/08/2018 CLINICAL DATA:  Nausea and vomiting EXAM: PORTABLE ABDOMEN - 1 VIEW COMPARISON:  CT 02/28/2018 FINDINGS: Surgical hardware in the upper lumbar spine. Surgical clips in the right upper quadrant. Nonobstructed bowel-gas pattern. Radiopaque material in the bladder. Probable diverticulum on the right side of the bladder. Status post right hip replacement with fixating screw paralleling the cortex of the right inter pelvis. IMPRESSION: 1. Nonobstructed gas pattern 2. Contrast material within the bladder Electronically Signed   By: Donavan Foil M.D.   On: 03/08/2018 22:30   Ct Angio Abd/pel W/ And/or W/o  Result Date: 03/09/2018 CLINICAL DATA:  Upper abdominal pain and nausea after mesenteric stent placement today. EXAM: CTA ABDOMEN AND PELVIS wITHOUT AND WITH CONTRAST TECHNIQUE: Multidetector CT imaging of the abdomen  and pelvis was performed using the standard protocol during bolus administration of intravenous contrast. Multiplanar reconstructed images and MIPs were obtained and reviewed to evaluate the vascular anatomy. CONTRAST:  100 cc Isovue 370 IV COMPARISON:  Abdomen/pelvis CT 02/28/2018 FINDINGS: VASCULAR Aorta: Moderate atherosclerosis. Normal caliber aorta without aneurysm, dissection, vasculitis or significant stenosis. Celiac: Plaque at the origin causes approximately 50% stenosis. No dissection or vasculitis. Distal branches are patent. SMA: Patent proximal SMA stent with resolved origin stenosis. Calcified distally but patent. No evidence of dissection. No evidence of contrast extravasation. Renals: Calcified plaque at the origins. No dissection or vasculitis. IMA: Patent without evidence of aneurysm, dissection, vasculitis or significant stenosis. Inflow: Patent without evidence of aneurysm, dissection, vasculitis or significant stenosis. Proximal Outflow: Bilateral common femoral and visualized portions of the superficial and profunda femoral arteries are patent without evidence of aneurysm, dissection, vasculitis or significant stenosis. No evidence of pseudoaneurysm formation or active extravasation at the puncture site. Veins: Not well assessed on arterial phase imaging. There is presumed mixing of opacified and non-opacified blood in the mesenteric venous confluence. Review of the MIP images confirms the above findings. NON-VASCULAR Lower chest: Linear atelectasis in both lower lobes. Trace right pleural effusion. Coronary artery calcifications. Peripherally calcified left breast implant partially included. Hepatobiliary: Slight capsular nodularity raises concern for cirrhosis. No discrete focal lesion. Postcholecystectomy with grossly unchanged biliary prominence allowing for arterial phase technique. Pancreas: Parenchymal atrophy. No ductal dilatation or inflammation. Spleen: Normal in size without focal  abnormality. Adrenals/Urinary Tract: Mild left adrenal thickening. Normal right adrenal gland. Excreted IV contrast in both kidneys and urinary bladder from angiogram earlier this day. No hydronephrosis or perinephric edema. Bilateral renal cysts. Stomach/Bowel: Small hiatal hernia. Stomach is nondistended. Presence of intra-abdominal ascites limits bowel evaluation. There is colonic wall thickening involving the ascending colon, hepatic flexure, unlikely proximal transverse colon. No definite small bowel inflammation. Colonic diverticulosis throughout the colon. Lymphatic: No definite adenopathy. No evidence of retroperitoneal hemorrhage. Reproductive: Uterus obscured by right hip arthroplasty. Other: Development of moderate volume abdominopelvic ascites. Fluid may be minimally complex, however does not represent values concerning for hemorrhage. There is mild mesenteric edema. No free air. Small supraumbilical hernia contains fat and small amount of free fluid. Musculoskeletal: Postsurgical change in the lumbar spine, unchanged from prior. Right hip arthroplasty. IMPRESSION: VASCULAR 1. Placement of superior mesenteric artery stent which is widely patent. No evidence of dissection or postprocedural pseudoaneurysm/hematoma. 2. Diffuse aortic and branch atherosclerosis. NON-VASCULAR 1. Interval development of moderate abdominopelvic ascites from prior exam. Fluid is minimally complex but  does not represent hemorrhage. 2. Colonic wall thickening involving the cecum, ascending, and proximal transverse colon. This is new since prior CT. Exact etiology is uncertain. Infectious or inflammatory etiologies are considered, colonic wall edema related to reperfusion could have a similar appearance. No increased density to suggest bowel wall hemorrhage. 3. Colonic diverticulosis without specific findings to suggest diverticulitis. 4. Question of nodular hepatic contours raises concern for cirrhosis. Recommend correlation with  cirrhosis risk factors. Electronically Signed   By: Jeb Levering M.D.   On: 03/09/2018 03:18   Ir Paracentesis  Result Date: 03/10/2018 INDICATION: Recent superior mesenteric artery stent placement. New ascites. Request for diagnostic paracentesis. EXAM: ULTRASOUND GUIDED RIGHT LATERAL ABDOMEN PARACENTESIS MEDICATIONS: 2% lidocaine = 10 mL COMPLICATIONS: None immediate. PROCEDURE: Informed written consent was obtained from the patient after a discussion of the risks, benefits and alternatives to treatment. A timeout was performed prior to the initiation of the procedure. Initial ultrasound scanning demonstrates a small amount of ascites within the right lateral abdomen. The right lateral abdomen was prepped and draped in the usual sterile fashion. 1% lidocaine with epinephrine was used for local anesthesia. Following this, a 19 gauge, 7-cm, Yueh catheter was introduced. An ultrasound image was saved for documentation purposes. The paracentesis was performed. The catheter was removed and a dressing was applied. The patient tolerated the procedure well without immediate post procedural complication. FINDINGS: A total of approximately 100 ml of clear red fluid was removed. Samples were sent to the laboratory as requested by the clinical team. IMPRESSION: Successful ultrasound-guided paracentesis yielding 175mL of peritoneal fluid. Read by: Gareth Eagle, PA-C Electronically Signed   By: Lucrezia Europe M.D.   On: 03/10/2018 11:13    Lab Data:  CBC: Recent Labs  Lab 03/16/18 1600 03/16/18 2208 03/17/18 0430 03/17/18 0757 03/18/18 0318 03/19/18 0300  WBC 18.9* 17.4* 15.9* 16.6* 15.6* 15.3*  NEUTROABS 15.3* 14.1* 13.0* 13.6* 12.9*  --   HGB 8.3* 7.9* 7.7* 7.8* 7.9* 8.2*  HCT 24.5* 23.9* 23.2* 23.6* 24.2* 26.0*  MCV 86.0 86.6 87.9 87.7 89.3 90.9  PLT 129* 121* 125* 119* 120* 811*   Basic Metabolic Panel: Recent Labs  Lab 03/15/18 0534  03/16/18 0407 03/16/18 1132 03/16/18 1600 03/17/18 0430  03/18/18 0318 03/18/18 1032 03/19/18 0300  NA 136   < > 135 134* 135 136  --  138 136  K 4.3   < > 4.1 4.1 4.0 3.7  --  3.7 3.7  CL 112*   < > 109 110 109 107  --  108 108  CO2 15*   < > 16* 15* 17* 22  --  22 20*  GLUCOSE 163*   < > 154* 144* 153* 133*  --  116* 107*  BUN 41*   < > 52* 54* 56* 51*  --  41* 35*  CREATININE 3.19*   < > 3.03* 2.84* 2.72* 2.15*  --  1.35* 0.99  CALCIUM 7.5*   < > 7.8* 7.7* 7.6* 7.6*  --  7.8* 7.9*  MG 2.1  --  2.1  --   --  2.2 2.3  --  2.3  PHOS 5.3*  --  5.3*  --   --  4.0 3.3  --  2.9   < > = values in this interval not displayed.   GFR: Estimated Creatinine Clearance: 57.9 mL/min (by C-G formula based on SCr of 0.99 mg/dL). Liver Function Tests: No results for input(s): AST, ALT, ALKPHOS, BILITOT, PROT, ALBUMIN in the last  168 hours. Recent Labs  Lab 03/16/18 1132  LIPASE 18   No results for input(s): AMMONIA in the last 168 hours. Coagulation Profile: Recent Labs  Lab 03/15/18 1328  INR 1.49   Cardiac Enzymes: Recent Labs  Lab 03/13/18 1329 03/13/18 1709 03/14/18 1103  TROPONINI <0.03 <0.03 0.04*   BNP (last 3 results) Recent Labs    08/17/17 1038 09/14/17 0921  PROBNP 612 503   HbA1C: No results for input(s): HGBA1C in the last 72 hours. CBG: Recent Labs  Lab 03/14/18 0324  GLUCAP 177*   Lipid Profile: No results for input(s): CHOL, HDL, LDLCALC, TRIG, CHOLHDL, LDLDIRECT in the last 72 hours. Thyroid Function Tests: No results for input(s): TSH, T4TOTAL, FREET4, T3FREE, THYROIDAB in the last 72 hours. Anemia Panel: No results for input(s): VITAMINB12, FOLATE, FERRITIN, TIBC, IRON, RETICCTPCT in the last 72 hours. Urine analysis:    Component Value Date/Time   COLORURINE AMBER (A) 03/15/2018 1108   APPEARANCEUR CLOUDY (A) 03/15/2018 1108   LABSPEC 1.020 03/15/2018 1108   PHURINE 5.0 03/15/2018 1108   GLUCOSEU NEGATIVE 03/15/2018 1108   HGBUR LARGE (A) 03/15/2018 1108   BILIRUBINUR NEGATIVE 03/15/2018 1108    KETONESUR NEGATIVE 03/15/2018 1108   PROTEINUR 100 (A) 03/15/2018 1108   UROBILINOGEN 1.0 03/30/2011 0901   NITRITE NEGATIVE 03/15/2018 1108   LEUKOCYTESUR SMALL (A) 03/15/2018 1108     Ripudeep Rai M.D. [facility] 03/19/2018, 1:14 PM  Pager: [phone] Between 7am to 7pm - call Pager - [phone]  After 7pm go to [email or web address] - password [email or web address]  Call night coverage person covering after 7pm

## 2018-03-19 NOTE — Care Management Note (Signed)
Case Management Note  Patient Details  Name: [name] MRN: [id number] Date of Birth: February 13, 1941  Subjective/Objective:      Had stenting of SMA on 3/18.  Noted to have abdominal pain, nausea, vomiting.  On 3/23 noted to have a large abdominal wall hematoma - transferred to ICU  Action/Plan:  PTA from home.  Prior to transfer to ICU pt was recommended for [facility] however CM requested reassessment   Expected Discharge Date:  03/05/18               Expected Discharge Plan:     In-House Referral:  Clinical Social Work  Discharge planning Services  CM Consult  Post Acute Care Choice:    Choice offered to:     DME Arranged:    DME Agency:     HH Arranged:    [place] Agency:     Status of Service:     If discussed at [employer], dates discussed:    Additional Comments: SNF recommended - CSW following Maryclare Labrador, RN 03/19/2018, 3:00 PM

## 2018-03-19 NOTE — Evaluation (Signed)
Physical Therapy Evaluation Patient Details Name: [name] MRN: [id number] DOB: 12-26-1940 Today's Date: 03/19/2018   History of Present Illness  77 year old female with past medical history of R THA, B TKA, B TSA, back surgery, neck surgery, CAD, PVD and COPD who is had worsening left hip issues that have gotten to the point where she does not ambulate much over the past month and presented to the emergency room on 3/10 with complaints of nausea, weakness and dyspnea on exertion and found to be in acute respiratory failure with hypoxia. Workup discovered multiplesubacute pulmonary embolus on CT scan. 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  During hospitalization patient is now s/p abdominal angiogram and parcentesis on 3/20.  GI was consulted, symptoms thought to be from ischemic colitis.  However hemoglobin continued to drop and on 3/24 AM, patient had a rapid response with hypotension, acute hypoxic respiratory failure, severe abdominal pain.  Repeat CTabdomen pelvis on 3/24 showed a new large anterior abdominal wall hematoma measuring 16.9 x 11.9  X 12.4 cm.  Hemoglobin 6.6.  Heparin drip, Plavix were discontinued, patient was transfused 2 units packed RBC and transferred to ICU.  IVC filter placed on 3/24.  Patient's creatinine function continued to trend up, renal ultrasound did not show any obstruction or hydronephrosis, possibly due to hypoperfusion/ATN.   Clinical Impression  Pt admitted with above diagnosis. Pt currently with functional limitations due to the deficits listed below (see PT Problem List). Pt was able to transfer to recliner with +2 mod assist.  Pt states it feels "good to be up."  Will probably need SNF prior to d/c home.  Will follow acutely.   Pt will benefit from skilled PT to increase their independence and safety with mobility to allow discharge to the venue listed below.      Follow Up Recommendations Supervision/Assistance - 24  hour;SNF    Equipment Recommendations  None recommended by PT    Recommendations for Other Services       Precautions / Restrictions Precautions Precautions: None Precaution Comments: pt denies h/o falls in past 1 year Restrictions Weight Bearing Restrictions: No      Mobility  Bed Mobility Overal bed mobility: Needs Assistance Bed Mobility: Supine to Sit     Supine to sit: Mod assist;+2 for physical assistance     General bed mobility comments: Needed assist for LEs and assist with elevation of trunk.  Transfers Overall transfer level: Needs assistance Equipment used: 2 person hand held assist Transfers: Sit to/from [facility] Sit to Stand: Mod assist;+2 physical assistance;From elevated surface         General transfer comment: Used pad to assist with power up with pt needing mod assist and cues as well as assist to maintain balance for stand pivot transfer.  Had to have guidance and asssit with weight shift to pivot.   Ambulation/Gait                Stairs            Wheelchair Mobility    Modified Rankin (Stroke Patients Only)       Balance Overall balance assessment: Needs assistance Sitting-balance support: No upper extremity supported;Feet supported Sitting balance-Leahy Scale: Good     Standing balance support: During functional activity;Bilateral upper extremity supported Standing balance-Leahy Scale: Poor Standing balance comment: relies on UE support for balance.  Pertinent Vitals/Pain Pain Assessment: Faces Faces Pain Scale: Hurts even more Pain Location: L hip (chronic OA pain per pt) Pain Descriptors / Indicators: Sore Pain Intervention(s): Limited activity within patient's tolerance;Monitored during session;Repositioned  91 bpm, 99% on 2.5LO2, 113/49  Home Living Family/patient expects to be discharged to:: Private residence Living Arrangements: Alone Available  Help at Discharge: Family;Available PRN/intermittently Type of Home: House Home Access: Stairs to enter Entrance Stairs-Rails: Right Entrance Stairs-Number of Steps: 3 Home Layout: One level Home Equipment: Walker - 4 wheels;Bedside commode;Shower seat;Wheelchair - manual      Prior Function Level of Independence: Independent with assistive device(s)         Comments: walks with rollator     Hand Dominance   Dominant Hand: Right    Extremity/Trunk Assessment   Upper Extremity Assessment Upper Extremity Assessment: Defer to OT evaluation LUE Deficits / Details: shoulder elevation ~50*, h/o failed reverse TSA    Lower Extremity Assessment LLE Sensation: history of peripheral neuropathy    Cervical / Trunk Assessment Cervical / Trunk Assessment: Kyphotic  Communication   Communication: No difficulties  Cognition Arousal/Alertness: Awake/alert Behavior During Therapy: WFL for tasks assessed/performed Overall Cognitive Status: Within Functional Limits for tasks assessed                                        General Comments      Exercises General Exercises - Lower Extremity Ankle Circles/Pumps: AROM;Both;5 reps;Seated Long Arc Quad: AAROM;Both;5 reps;Seated   Assessment/Plan    PT Assessment Patient needs continued PT services  PT Problem List Decreased activity tolerance;Decreased mobility;Decreased knowledge of use of DME;Decreased safety awareness;Decreased knowledge of precautions;Pain       PT Treatment Interventions Gait training;Therapeutic activities;Patient/family education;Wheelchair mobility training;Balance training;Therapeutic exercise;Functional mobility training;DME instruction    PT Goals (Current goals can be found in the Care Plan section)  Acute Rehab PT Goals Patient Stated Goal: return to independence with mobility PT Goal Formulation: With patient/family Time For Goal Achievement: 04/02/18 Potential to Achieve Goals:  Good    Frequency Min 3X/week   Barriers to discharge Decreased caregiver support      Co-evaluation               AM-PAC PT "6 Clicks" Daily Activity  Outcome Measure Difficulty turning over in bed (including adjusting bedclothes, sheets and blankets)?: Unable Difficulty moving from lying on back to sitting on the side of the bed? : Unable Difficulty sitting down on and standing up from a chair with arms (e.g., wheelchair, bedside commode, etc,.)?: A Lot Help needed moving to and from a bed to chair (including a wheelchair)?: A Lot Help needed walking in hospital room?: Total Help needed climbing 3-5 steps with a railing? : Total 6 Click Score: 8    End of Session Equipment Utilized During Treatment: Gait belt;Oxygen(2.5L O2) Activity Tolerance: Patient limited by fatigue Patient left: with call bell/phone within reach;in chair;with chair alarm set Nurse Communication: Mobility status PT Visit Diagnosis: Difficulty in walking, not elsewhere classified (R26.2)    Time: 1324-4010 PT Time Calculation (min) (ACUTE ONLY): 20 min   Charges:   PT Evaluation $PT Eval Moderate Complexity: 1 Mod     PT G Codes:        Madina Galati,PT Acute Rehabilitation [phone] (pager)   Denice Paradise 03/19/2018, 12:42 PM

## 2018-03-19 NOTE — Progress Notes (Addendum)
[facility] Gastroenterology Progress Note  [name] 77 y.o. 12/01/1941  CC:  Nausea, vomiting, abdominal pain, colitis   Subjective:  Patient resting comfortably in the recliner. Abdominal pain improving. Denied nausea and vomiting.    Objective: Vital signs in last 24 hours: Vitals:   03/19/18 1000 03/19/18 1149  BP: (!) 146/60   Pulse: 95   Resp: 17   Temp:  98.6 F (37 C)  SpO2: 98%     Physical Exam:  General. Alert and oriented 3. In NAD  Abdomen - abdomen mild  Distended, NT,  Bowel sounds are present.     Lab Results: Recent Labs    03/18/18 0318 03/18/18 1032 03/19/18 0300  NA  --  138 136  K  --  3.7 3.7  CL  --  108 108  CO2  --  22 20*  GLUCOSE  --  116* 107*  BUN  --  41* 35*  CREATININE  --  1.35* 0.99  CALCIUM  --  7.8* 7.9*  MG 2.3  --  2.3  PHOS 3.3  --  2.9   No results for input(s): AST, ALT, ALKPHOS, BILITOT, PROT, ALBUMIN in the last 72 hours. Recent Labs    03/17/18 0757 03/18/18 0318 03/19/18 0300  WBC 16.6* 15.6* 15.3*  NEUTROABS 13.6* 12.9*  --   HGB 7.8* 7.9* 8.2*  HCT 23.6* 24.2* 26.0*  MCV 87.7 89.3 90.9  PLT 119* 120* 134*   No results for input(s): LABPROT, INR in the last 72 hours.    Assessment/Plan: - anemia probably from large abdominal wall /rectal sheet hematoma. Hemoglobin stable - right-sided colitis. Most likely ischemia/reperfusion injury after placement of SMA stent. - abdominal pain and distention. Most likely from a large hematoma. - Leukocytosis. improving - respiratory failure. resolved - acute kidney injury.resolved - New-onset ascites. Paracentesis on 03/10/2018 showed SAAG >1.1 - CT scan showing nodular liver concerning for cirrhosis. - DVT S/P IVC filter placement . Aspirin and Plavix currently on hold. Heparin discontinued.    Recommendations ------------------------ - patient's symptoms are improving. Hemoglobin stable. No further inpatient GI workup planned. GI will sign off. Call us back  if needed. Follow-up in GI clinic in 2 months after discharge.  Otis Brace MD, [place] 03/19/2018, 12:35 PM  Contact #  [phone]

## 2018-03-20 ENCOUNTER — Encounter (HOSPITAL_COMMUNITY): Payer: Self-pay

## 2018-03-20 LAB — PHOSPHORUS: Phosphorus: 3.1 mg/dL (ref 2.5–4.6)

## 2018-03-20 LAB — BASIC METABOLIC PANEL
Anion gap: 8 (ref 5–15)
BUN: 25 mg/dL — ABNORMAL HIGH (ref 6–20)
CO2: 24 mmol/L (ref 22–32)
Calcium: 8 mg/dL — ABNORMAL LOW (ref 8.9–10.3)
Chloride: 107 mmol/L (ref 101–111)
Creatinine, Ser: 0.74 mg/dL (ref 0.44–1.00)
GFR calc Af Amer: >60 mL/min (ref >60)
GFR calc non Af Amer: >60 mL/min (ref >60)
Glucose, Bld: 100 mg/dL — ABNORMAL HIGH (ref 65–99)
Potassium: 3.7 mmol/L (ref 3.5–5.1)
Sodium: 139 mmol/L (ref 135–145)

## 2018-03-20 LAB — CBC
HCT: 26.7 % — ABNORMAL LOW (ref 36.0–46.0)
Hemoglobin: 8.4 g/dL — ABNORMAL LOW (ref 12.0–15.0)
MCH: 29.2 pg (ref 26.0–34.0)
MCHC: 31.5 g/dL (ref 30.0–36.0)
MCV: 92.7 fL (ref 78.0–100.0)
Platelets: 145 10*3/uL — ABNORMAL LOW (ref 150–400)
RBC: 2.88 MIL/uL — ABNORMAL LOW (ref 3.87–5.11)
RDW: 19.6 % — ABNORMAL HIGH (ref 11.5–15.5)
WBC: 12.6 10*3/uL — ABNORMAL HIGH (ref 4.0–10.5)

## 2018-03-20 LAB — MAGNESIUM: Magnesium: 1.8 mg/dL (ref 1.7–2.4)

## 2018-03-20 MED ORDER — FUROSEMIDE 40 MG PO TABS
40.0000 mg | ORAL_TABLET | Freq: Every day | ORAL | Status: DC
Start: 2018-03-21 — End: 2018-03-23
  Administered 2018-03-21 – 2018-03-23 (×3): 40 mg via ORAL
  Filled 2018-03-20 (×3): qty 1

## 2018-03-20 MED ORDER — FUROSEMIDE 20 MG PO TABS
20.0000 mg | ORAL_TABLET | Freq: Once | ORAL | Status: AC
  Administered 2018-03-20: 20 mg via ORAL
  Filled 2018-03-20: qty 1

## 2018-03-20 NOTE — Progress Notes (Signed)
[facility]                                                                              Patient Demographics  [name], is a 77 y.o. female, DOB - December 28, 1940, [place] date - 02/28/2018   Admitting Physician Etta Quill, DO  Outpatient Primary MD for the patient is [name], MD  Outpatient specialists:   LOS - 19  days   Medical records reviewed and are as summarized below:    Chief Complaint  Patient presents with  . Nausea       Brief summary   76 y/o female with CAD, COPD, prior PE, history of small bowel AVMs and had been off anticoagulants and since February 2018 was admitted on 3/10 with nausea, weakness and dyspnea on exertion, acute respiratory failure with hypoxia.  Workup showed multiple subacute pulmonary embolus on CT scan as well as large acute DVT in the left femoral vein.  She was incidentally found to have severe stenosis of superior mesenteric artery.  Patient was started on heparin drip, admitted to hospitalist service, had stenting of SMA on 3/18.  Since the stent placement, she had worsening abdominal pain, nausea and vomiting.  CTA abdomen and pelvis on 3/19 revealed patent SMA stent with moderate abdominopelvic ascites, colonic wall thickening.  GI was consulted, symptoms thought to be from ischemic colitis.  However hemoglobin continued to drop and on 3/24 AM, patient had a rapid response with hypotension, acute hypoxic respiratory failure, severe abdominal pain.  Repeat CTabdomen pelvis on 3/24 showed a new large anterior abdominal wall hematoma measuring 16.9 x 11.9  X 12.4 cm.  Hemoglobin 6.6.  Heparin drip, Plavix were discontinued, patient was transfused 2 units packed RBC and transferred to ICU.  She was placed on vasopressors per IVC filter placed on 3/24.  Patient's creatinine function continued to trend up, renal ultrasound did not show any obstruction or hydronephrosis, possibly due to hypoperfusion/ATN.  For  the new onset ascites, patient underwent paracentesis on 3/20 which showed SAAG >1.1 Vascular surgery following patient, recommended starting aspirin once hemoglobin has remained stable.   Assessment & Plan    Principal Problem: Right-sided ischemic colitis, superior mesenteric artery stenosis status post stent placement to SMA on 3/18.  -Nausea, vomiting improved, abdominal pain but improving.  Advance diet to soft solids.   -Vascular surgery following, continue aspirin 81 mg daily -Antibiotics completed 3/27 however low threshold for initiating if fevers or worsening leukocytosis.  Currently stable  Active Problems: Acute hypoxic respiratory failure with pulmonary emboli, left femoral DVT, COPD -Status post IVC filter on 3/24 -O2 sats 97% on 2 L, wean as tolerated -Still significant pedal edema, I/O's with 17.0 L positive, increase Lasix to 40 mg daily,  -Continue duo nebs as needed   Acute on chronic blood loss anemia, hemorrhagic shock, rectus sheath hematoma, abdominal pain -H&H stable, 8.4 today. -Hemorrhagic shock resolved, had briefly required vasopressors  Acute kidney injury with non-anion gap metabolic acidosis -Possibly due to hypoperfusion, ATN and contrast.  Baseline creatinine 0.7-1 -Creatinine improving today, peaked at 3.2 -Renal ultrasound did not show any  obstruction or hydronephrosis -Monitor creatinine closely with diuresis  New ascites status post paracentesis 3/20 - SAAG >1.1, felt inflammatory, CT scan with cirrhosis, ischemic colitis -GI following  Code Status: Partial DVT Prophylaxis: SCD Family Communication: Discussed in detail with the patient, all imaging results, lab results explained to the patient and granddaughter at the bedside  Disposition Plan: Continue monitoring in stepdown units, PT OT, encourage ambulation  Time Spent in minutes : 25  Procedures:   3/18 SMA stenosis status post stent CT ABD /Pelvis 3/19>Interval development of  moderate abdominopelvic ascites,Colonic wall thickening involving the cecum, ascending, proximal transverse colon 3/20 status post paracentesis with 100 cc removed CT abd /pelvis 3/24>>New, large anterior abdominal wall hematoma measuring 16.9 x 11.9x 12.4 cm.2. Small volume ascites with nodular appearance of the liver concerning for cirrhosis.. Medium-sized right pleural effusion 3/24: Retrievable IVC filter placed  Consultants:   PCCM Vascular surgery IR Gastroenterology  Antimicrobials:      Medications  Scheduled Meds: . aspirin EC  81 mg Oral Daily  . chlorhexidine  15 mL Mouth Rinse BID  . cholecalciferol  1,000 Units Oral Daily  . citalopram  30 mg Oral Daily  . docusate sodium  100 mg Oral BID  . feeding supplement (ENSURE ENLIVE)  237 mL Oral TID BM  . ferrous sulfate  325 mg Oral Q breakfast  . furosemide  20 mg Oral Daily  . ipratropium-albuterol  3 mL Inhalation BID  . lidocaine  1 patch Transdermal Q24H  . mouth rinse  15 mL Mouth Rinse q12n4p  . pantoprazole  40 mg Oral Daily  . pilocarpine  1 drop Both Eyes BID  . pramipexole  0.25 mg Oral QHS  . senna  1 tablet Oral QHS  . sodium chloride flush  3 mL Intravenous Q12H  . sodium chloride flush  3 mL Intravenous Q12H   Continuous Infusions: . lactated ringers 50 mL/hr at 03/20/18 0600  . sodium chloride     PRN Meds:.acetaminophen **OR** acetaminophen, HYDROmorphone (DILAUDID) injection, ipratropium-albuterol, metoCLOPramide (REGLAN) injection, oxyCODONE, prochlorperazine, sodium chloride flush, sodium chloride flush, traZODone   Antibiotics   Anti-infectives (From admission, onward)   Start     Dose/Rate Route Frequency Ordered Stop   03/16/18 0600  ciprofloxacin (CIPRO) IVPB 200 mg  Status:  Discontinued     200 mg 100 mL/hr over 60 Minutes Intravenous Every 24 hours 03/15/18 0933 03/17/18 1223   03/15/18 0600  ciprofloxacin (CIPRO) IVPB 400 mg  Status:  Discontinued     400 mg 200 mL/hr over 60  Minutes Intravenous Every 24 hours 03/14/18 1259 03/15/18 0933   03/13/18 1800  ciprofloxacin (CIPRO) IVPB 400 mg  Status:  Discontinued     400 mg 200 mL/hr over 60 Minutes Intravenous Every 12 hours 03/13/18 1711 03/14/18 1259   03/13/18 1800  metroNIDAZOLE (FLAGYL) IVPB 500 mg  Status:  Discontinued     500 mg 100 mL/hr over 60 Minutes Intravenous Every 8 hours 03/13/18 1711 03/16/18 1100   03/10/18 1230  metroNIDAZOLE (FLAGYL) IVPB 500 mg  Status:  Discontinued     500 mg 100 mL/hr over 60 Minutes Intravenous Every 8 hours 03/10/18 1208 03/10/18 1815   03/10/18 1230  ciprofloxacin (CIPRO) IVPB 400 mg  Status:  Discontinued     400 mg 200 mL/hr over 60 Minutes Intravenous Every 12 hours 03/10/18 1208 03/10/18 1815        Subjective:   [name] was seen and examined today.  Feeling better,  denies any specific complaints, very frail and tired.  No nausea vomiting, abdominal pain improving.  No chest pain, fevers or chills.  Denies any new weakness, numbness or tingling.  No diarrhea   Objective:   Vitals:   03/20/18 0900 03/20/18 0903 03/20/18 1000 03/20/18 1100  BP:      Pulse: 98 (!) 104 91 83  Resp: 14 15 12 14   Temp:      TempSrc:      SpO2: 97% 97% 98% 99%  Weight:      Height:        Intake/Output Summary (Last 24 hours) at 03/20/2018 1219 Last data filed at 03/20/2018 1100 Gross per 24 hour  Intake 1653.67 ml  Output 1390 ml  Net 263.67 ml     Wt Readings from Last 3 Encounters:  03/20/18 95.4 kg (210 lb 5.1 oz)  11/19/17 84.1 kg (185 lb 6.4 oz)  09/14/17 78.6 kg (173 lb 3.2 oz)     Exam   General: Alert and oriented x 3, NAD, frail and ill-appearing  Eyes:   HEENT:   Cardiovascular: S1 S2 clear, Regular rate and rhythm. 2+  pedal edema b/l  Respiratory: Clear to auscultation bilaterally, no wheezing, rales or rhonchi  Gastrointestinal: Soft, nontender, nondistended, + bowel sounds  Ext: 2-3+ pedal edema bilaterally  Neuro: no new  deficit  Musculoskeletal: No digital cyanosis, clubbing  Skin: No rashes  Psych: Normal affect and demeanor, alert and oriented x3    Data Reviewed:  I have personally reviewed following labs and imaging studies  Micro Results Recent Results (from the past 240 hour(s))  Culture, Urine     Status: Abnormal   Collection Time: 03/13/18  4:42 PM  Result Value Ref Range Status   Specimen Description URINE, CATHETERIZED  Final   Special Requests NONE  Final   Culture (A)  Final    <10,000 COLONIES/mL INSIGNIFICANT GROWTH Performed at [facility], [address] [place], [place] [address]    Report Status 03/14/2018 FINAL  Final  Culture, blood (Routine X 2) w Reflex to ID Panel     Status: None   Collection Time: 03/14/18 11:15 AM  Result Value Ref Range Status   Specimen Description BLOOD LEFT HAND  Final   Special Requests   Final    BOTTLES DRAWN AEROBIC ONLY Blood Culture adequate volume   Culture   Final    NO GROWTH 5 DAYS Performed at [facility], [place] [address], [place], [place] [address]    Report Status 03/19/2018 FINAL  Final  Culture, blood (Routine X 2) w Reflex to ID Panel     Status: None   Collection Time: 03/14/18 11:17 AM  Result Value Ref Range Status   Specimen Description BLOOD LEFT HAND  Final   Special Requests   Final    BOTTLES DRAWN AEROBIC ONLY Blood Culture results may not be optimal due to an inadequate volume of blood received in culture bottles   Culture   Final    NO GROWTH 5 DAYS Performed at [facility], [place] [address], [place], [place] [address]    Report Status 03/19/2018 FINAL  Final    Radiology Reports Ct Abdomen Pelvis Wo Contrast  Addendum Date: 03/14/2018   ADDENDUM REPORT: 03/14/2018 04:14 ADDENDUM: These results were called by telephone at the time of interpretation on 03/14/2018 at 4:04 am to [facility] , who verbally acknowledged these results. Electronically Signed   By:  Ulyses Jarred M.D.   On:  03/14/2018 04:14   Result Date: 03/14/2018 CLINICAL DATA:  Abdominal pain and fever EXAM: CT ABDOMEN AND PELVIS WITHOUT CONTRAST TECHNIQUE: Multidetector CT imaging of the abdomen and pelvis was performed following the standard protocol without IV contrast. COMPARISON:  CT abdomen pelvis 03/09/2018 FINDINGS: Examination is degraded by streak artifact from external patient devices and from the arms down positioning of the patient, creating multiple streak artifacts and decreasing sensitivity and specificity of this scan. Lower chest: Right pleural effusion and associated atelectasis. Hepatobiliary: Right upper quadrant ascites. Nodular appearance of the liver. No focal liver lesion. Status post cholecystectomy. Pancreas: Atrophic appearance of the pancreas. Spleen: Normal. Adrenals/Urinary Tract: --Adrenal glands: Normal. --Right kidney/ureter: No hydronephrosis, perinephric stranding or nephrolithiasis. No obstructing ureteral stones. --Left kidney/ureter: No hydronephrosis, perinephric stranding or nephrolithiasis. No obstructing ureteral stones. --Urinary bladder: Normal appearance for the degree of distention. Stomach/Bowel: --Stomach/Duodenum: No hiatal hernia or other gastric abnormality. Normal duodenal course. --Small bowel: No dilatation or inflammation. --Colon: Diffuse colonic diverticulosis without acute inflammation. --Appendix: Normal. Vascular/Lymphatic: Atherosclerotic calcification is present within the non-aneurysmal abdominal aorta, without hemodynamically significant stenosis. No abdominal or pelvic lymphadenopathy. Reproductive: Pelvis largely obscured by streak artifact. Musculoskeletal. Right total hip arthroplasty. L1-L3 posterior spinal fusion. Other: Large anterior abdominal wall hematoma measuring up to 16.9 x 11.9 x 12.4 cm. This is new compared to the recent study from 03/09/2018. This exerts mass effect on the intra-abdominal contents. IMPRESSION: 1. New, large anterior abdominal wall  hematoma measuring 16.9 x 11.9 x 12.4 cm. 2. Small volume ascites with nodular appearance of the liver concerning for cirrhosis. 3. Medium-sized right pleural effusion with associated atelectasis. 4. Diffuse colonic diverticulosis without acute inflammation. 5.  Aortic Atherosclerosis (ICD10-I70.0). Electronically Signed: By: Ulyses Jarred M.D. On: 03/14/2018 03:45   Ct Angio Chest Pe W/cm &/or Wo Cm  Result Date: 03/01/2018 CLINICAL DATA:  Subacute onset of nausea. Generalized abdominal pain. Mild generalized chest pain and shortness of breath. EXAM: CT ANGIOGRAPHY CHEST CT ABDOMEN AND PELVIS WITH CONTRAST TECHNIQUE: Multidetector CT imaging of the chest was performed using the standard protocol during bolus administration of intravenous contrast. Multiplanar CT image reconstructions and MIPs were obtained to evaluate the vascular anatomy. Multidetector CT imaging of the abdomen and pelvis was performed using the standard protocol during bolus administration of intravenous contrast. CONTRAST:  80 mL of Isovue 370 IV contrast COMPARISON:  CT of the abdomen and pelvis performed 11/08/2017, and CT of the thoracic spine performed 07/26/2014 FINDINGS: CTA CHEST FINDINGS Cardiovascular: There appears to be subacute or chronic pulmonary embolus within the pulmonary artery to the left lower lobe, extending minimally into segmental branches. No additional pulmonary embolus is identified. The RV/LV ratio of 1.1 may reflect right heart strain. Diffuse coronary artery calcifications are seen. Diffuse calcification is noted along the aortic arch and proximal great vessels. The heart remains normal in size. The patient is status post median sternotomy. There is a chronic fracture of the inferior-most sternal wire. Mediastinum/Nodes: The mediastinum is otherwise unremarkable in appearance. No mediastinal lymphadenopathy is seen. No pericardial effusion is identified. The thyroid gland is grossly unremarkable. No axillary  lymphadenopathy is seen. Lungs/Pleura: Minimal bilateral atelectasis is noted. The lungs are otherwise clear. Mild scarring is noted at the lung apices. No significant focal consolidation, pleural effusion or pneumothorax is seen. No masses are identified. Musculoskeletal: No acute osseous abnormalities are identified. The patient's right shoulder arthroplasty is incompletely imaged but appears grossly unremarkable. The visualized  musculature is unremarkable in appearance. Diffuse calcification is noted about the patient's bilateral breast implants. Review of the MIP images confirms the above findings. CT ABDOMEN and PELVIS FINDINGS Hepatobiliary: The liver is unremarkable in appearance. The patient is status post cholecystectomy, with clips noted at the gallbladder fossa. There is corresponding distention of the intrahepatic biliary ducts and common bile duct. Pancreas: The pancreas is atrophic and grossly unremarkable in appearance. Spleen: The spleen is unremarkable in appearance. Adrenals/Urinary Tract: Minimal calcification is noted at the left adrenal gland, likely adjacent to a small underlying adrenal lesion. The right adrenal gland is unremarkable. A right renal cyst is again noted, better characterized on the prior study. The kidneys are otherwise unremarkable. There is no evidence of hydronephrosis. No perinephric stranding is seen. Stomach/Bowel: The stomach is unremarkable in appearance. The small bowel is within normal limits. The appendix is normal in caliber, without evidence of appendicitis. The colon is unremarkable in appearance. Vascular/Lymphatic: Diffuse calcification is seen along the abdominal aorta and its branches, including along the right renal artery and superior mesenteric artery. There is likely relatively severe luminal narrowing at the proximal superior mesenteric artery. The abdominal aorta is otherwise grossly unremarkable. The inferior vena cava is grossly unremarkable. No  retroperitoneal lymphadenopathy is seen. No pelvic sidewall lymphadenopathy is identified. Reproductive: The bladder is mildly distended and grossly unremarkable. The uterus is unremarkable in appearance. The ovaries are grossly symmetric. No suspicious adnexal masses are seen. Other: No additional soft tissue abnormalities are seen. Musculoskeletal: No acute osseous abnormalities are identified. The patient's right hip arthroplasty is incompletely imaged, but appears grossly unremarkable. The patient is status post lumbar spinal fusion at L1-L3, with underlying decompression from L1-L4. The visualized musculature is unremarkable in appearance. Review of the MIP images confirms the above findings. IMPRESSION: 1. Apparent subacute or chronic pulmonary embolus within the pulmonary artery to the left lower lung lobe, extending minimally into segmental branches. RV/LV ratio of 1.1 may reflect right heart strain. Would correlate with the patient's symptoms. 2. Minimal bilateral atelectasis noted. Mild scarring at the lung apices. 3. Diffuse coronary artery calcifications seen. 4. Diffuse aortic atherosclerosis, with diffuse calcification along the right renal artery and superior mesenteric artery. Likely relatively severe luminal narrowing at the proximal superior mesenteric artery. Would correlate for any associated symptoms. Critical Value/emergent results were called by telephone at the time of interpretation on 03/01/2018 at 1:00 am to the ER clinical team, who verbally acknowledged these results. Electronically Signed   By: Garald Balding M.D.   On: 03/01/2018 01:03   Ct Abdomen Pelvis W Contrast  Result Date: 03/01/2018 CLINICAL DATA:  Subacute onset of nausea. Generalized abdominal pain. Mild generalized chest pain and shortness of breath. EXAM: CT ANGIOGRAPHY CHEST CT ABDOMEN AND PELVIS WITH CONTRAST TECHNIQUE: Multidetector CT imaging of the chest was performed using the standard protocol during bolus  administration of intravenous contrast. Multiplanar CT image reconstructions and MIPs were obtained to evaluate the vascular anatomy. Multidetector CT imaging of the abdomen and pelvis was performed using the standard protocol during bolus administration of intravenous contrast. CONTRAST:  80 mL of Isovue 370 IV contrast COMPARISON:  CT of the abdomen and pelvis performed 11/08/2017, and CT of the thoracic spine performed 07/26/2014 FINDINGS: CTA CHEST FINDINGS Cardiovascular: There appears to be subacute or chronic pulmonary embolus within the pulmonary artery to the left lower lobe, extending minimally into segmental branches. No additional pulmonary embolus is identified. The RV/LV ratio of 1.1 may reflect right heart strain.  Diffuse coronary artery calcifications are seen. Diffuse calcification is noted along the aortic arch and proximal great vessels. The heart remains normal in size. The patient is status post median sternotomy. There is a chronic fracture of the inferior-most sternal wire. Mediastinum/Nodes: The mediastinum is otherwise unremarkable in appearance. No mediastinal lymphadenopathy is seen. No pericardial effusion is identified. The thyroid gland is grossly unremarkable. No axillary lymphadenopathy is seen. Lungs/Pleura: Minimal bilateral atelectasis is noted. The lungs are otherwise clear. Mild scarring is noted at the lung apices. No significant focal consolidation, pleural effusion or pneumothorax is seen. No masses are identified. Musculoskeletal: No acute osseous abnormalities are identified. The patient's right shoulder arthroplasty is incompletely imaged but appears grossly unremarkable. The visualized musculature is unremarkable in appearance. Diffuse calcification is noted about the patient's bilateral breast implants. Review of the MIP images confirms the above findings. CT ABDOMEN and PELVIS FINDINGS Hepatobiliary: The liver is unremarkable in appearance. The patient is status post  cholecystectomy, with clips noted at the gallbladder fossa. There is corresponding distention of the intrahepatic biliary ducts and common bile duct. Pancreas: The pancreas is atrophic and grossly unremarkable in appearance. Spleen: The spleen is unremarkable in appearance. Adrenals/Urinary Tract: Minimal calcification is noted at the left adrenal gland, likely adjacent to a small underlying adrenal lesion. The right adrenal gland is unremarkable. A right renal cyst is again noted, better characterized on the prior study. The kidneys are otherwise unremarkable. There is no evidence of hydronephrosis. No perinephric stranding is seen. Stomach/Bowel: The stomach is unremarkable in appearance. The small bowel is within normal limits. The appendix is normal in caliber, without evidence of appendicitis. The colon is unremarkable in appearance. Vascular/Lymphatic: Diffuse calcification is seen along the abdominal aorta and its branches, including along the right renal artery and superior mesenteric artery. There is likely relatively severe luminal narrowing at the proximal superior mesenteric artery. The abdominal aorta is otherwise grossly unremarkable. The inferior vena cava is grossly unremarkable. No retroperitoneal lymphadenopathy is seen. No pelvic sidewall lymphadenopathy is identified. Reproductive: The bladder is mildly distended and grossly unremarkable. The uterus is unremarkable in appearance. The ovaries are grossly symmetric. No suspicious adnexal masses are seen. Other: No additional soft tissue abnormalities are seen. Musculoskeletal: No acute osseous abnormalities are identified. The patient's right hip arthroplasty is incompletely imaged, but appears grossly unremarkable. The patient is status post lumbar spinal fusion at L1-L3, with underlying decompression from L1-L4. The visualized musculature is unremarkable in appearance. Review of the MIP images confirms the above findings. IMPRESSION: 1. Apparent  subacute or chronic pulmonary embolus within the pulmonary artery to the left lower lung lobe, extending minimally into segmental branches. RV/LV ratio of 1.1 may reflect right heart strain. Would correlate with the patient's symptoms. 2. Minimal bilateral atelectasis noted. Mild scarring at the lung apices. 3. Diffuse coronary artery calcifications seen. 4. Diffuse aortic atherosclerosis, with diffuse calcification along the right renal artery and superior mesenteric artery. Likely relatively severe luminal narrowing at the proximal superior mesenteric artery. Would correlate for any associated symptoms. Critical Value/emergent results were called by telephone at the time of interpretation on 03/01/2018 at 1:00 am to the ER clinical team, who verbally acknowledged these results. Electronically Signed   By: Garald Balding M.D.   On: 03/01/2018 01:03   Ir Ivc Filter Plmt / S&i /img Guid/mod Sed  Result Date: 03/14/2018 INDICATION: 77 year old with history of pulmonary embolism and left lower extremity DVT. Patient now has a large abdominal wall hematoma and no longer  candidate for anticoagulation. EXAM: IVC FILTER PLACEMENT; IVC VENOGRAM; ULTRASOUND FOR VASCULAR ACCESS Physician: Stephan Minister. Anselm Pancoast, MD MEDICATIONS: None. ANESTHESIA/SEDATION: Fentanyl 25 mcg IV; Versed 0.5 mg IV Moderate Sedation Time:  22 minutes The patient was continuously monitored during the procedure by the interventional radiology nurse under my direct supervision. CONTRAST:  Carbon dioxide FLUOROSCOPY TIME:  Fluoroscopy Time: 7 minutes 6 seconds (327 mGy). COMPLICATIONS: None immediate. PROCEDURE: Informed consent was obtained for an IVC venogram and filter placement. Ultrasound demonstrated a patent right internal jugular vein. Ultrasound images were obtained for documentation. The right side of the neck was prepped and draped in a sterile fashion. Maximal barrier sterile technique was utilized including caps, mask, sterile gowns, sterile  gloves, sterile drape, hand hygiene and skin antiseptic. The skin was anesthetized with 1% lidocaine. A 21 gauge needle was directed into the vein with ultrasound guidance and a micropuncture dilator set was placed. A wire was advanced into the IVC. The filter sheath was advanced over the wire into the IVC. An IVC venogram was performed with carbon dioxide. Limited evaluation of the IVC due to carbon dioxide and the lumbar surgical hardware. Therefore, a MPA catheter was used to cannulate bilateral renal veins. Renal venography was also performed with carbon dioxide bilaterally. The location of the renal veins were confidently identified. Fluoroscopic images were obtained for documentation. A Bard Denali filter was deployed in the IVC below the lowest renal vein. Follow-up images were obtained to confirm adequate deployment of the filter. Vascular sheath was removed with manual compression. FINDINGS: IVC was patent. Bilateral renal veins were identified. The filter was deployed below the lowest renal vein. IMPRESSION: Successful placement of a retrievable IVC filter. PLAN: This IVC filter is potentially retrievable. The patient will be assessed for filter retrieval by Interventional Radiology in approximately 8-12 weeks. Further recommendations regarding filter retrieval, continued surveillance or declaration of device permanence, will be made at that time. Electronically Signed   By: Markus Daft M.D.   On: 03/14/2018 13:03   US Renal  Result Date: 03/15/2018 CLINICAL DATA:  Acute kidney injury. EXAM: RENAL / URINARY TRACT ULTRASOUND COMPLETE COMPARISON:  CT, 03/14/2018. FINDINGS: Right Kidney: Length: 8.6 cm. Diffuse cortical thinning. Increased renal parenchymal echogenicity. There is a 2.6 x 2.0 x 2.3 cm cyst arising from the lower pole. No other masses, no stones and no hydronephrosis. Left Kidney: Length: 9.2 cm. Mild diffuse cortical thinning. Borderline increased renal parenchymal echogenicity. No mass,  stone or hydronephrosis. Lower pole partly obscured by overlying bowel gas. Bladder: Decompressed with a Foley catheter.  Not well assessed. Patient also has ascites and a right pleural effusion. IMPRESSION: 1. Findings consistent with medical renal disease with renal cortical thinning, greater on the right, and increased renal parenchymal echogenicity on the right and borderline increased renal parenchymal echogenicity on the left. 2. No acute findings.  No hydronephrosis. 3. 2.6 cm right lower pole renal cyst. Electronically Signed   By: Lajean Manes M.D.   On: 03/15/2018 15:45   Dg Chest Port 1 View  Result Date: 03/18/2018 CLINICAL DATA:  Dyspnea, bilateral leg and arm swelling, abdominal pain and nausea for the past 3-4 days. History of COPD, CHF, and previous CABG. EXAM: PORTABLE CHEST 1 VIEW COMPARISON:  Portable chest x-ray of March 15, 2018 FINDINGS: The lungs are well-expanded. There is no focal infiltrate. There is no pleural effusion. The cardiac silhouette is enlarged. The pulmonary vascularity is not engorged. There are post CABG changes. There is calcification in the  wall of the thoracic aorta. There are bilateral breast implants exhibiting capsular calcification. IMPRESSION: COPD. Mild cardiomegaly. Previous CABG. No pulmonary edema, pneumonia, or pleural effusion. Thoracic aortic atherosclerosis. Electronically Signed   By: David  Martinique M.D.   On: 03/18/2018 09:15   Dg Chest Port 1 View  Result Date: 03/15/2018 CLINICAL DATA:  Pleural effusion. EXAM: PORTABLE CHEST 1 VIEW COMPARISON:  Radiograph of March 13, 2018. FINDINGS: The heart size and mediastinal contours are within normal limits. Atherosclerosis of thoracic aorta is noted. Status post coronary artery bypass graft. No pneumothorax is noted. Increased right basilar opacity is noted concerning for worsening atelectasis or infiltrate with associated pleural effusion. Status post right shoulder arthroplasty. IMPRESSION: Increased right  basilar atelectasis or infiltrate is noted with possible associated pleural effusion. Aortic Atherosclerosis (ICD10-I70.0). Electronically Signed   By: Marijo Conception, M.D.   On: 03/15/2018 07:45   Dg Chest Port 1 View  Result Date: 03/13/2018 CLINICAL DATA:  77 year old with shortness of breath. EXAM: PORTABLE CHEST 1 VIEW COMPARISON:  Chest CT 02/28/2018 and chest radiograph 11/08/2017 FINDINGS: Bilateral breast implants. No focal airspace disease or pulmonary edema. Right shoulder replacement. Atherosclerotic calcifications at the aortic arch. Prior CABG procedure. Sclerosis and degenerative changes at the left shoulder. Surgical hardware in the lumbar spine. Heart size is within normal limits. IMPRESSION: No acute chest findings. Electronically Signed   By: Markus Daft M.D.   On: 03/13/2018 12:52   Dg Abd 2 Views  Result Date: 03/12/2018 CLINICAL DATA:  Lower abdominal pain beginning this morning post vascular surgery EXAM: ABDOMEN - 2 VIEW COMPARISON:  03/08/2018 FINDINGS: Normal bowel gas pattern. No bowel dilatation or bowel wall thickening. Gas present in rectum. Bones demineralized with evidence of prior lumbar fusion and prior RIGHT hip replacement. Scattered degenerative changes of lower thoracic spine. Surgical clips RIGHT upper quadrant question cholecystectomy. Degenerative changes LEFT hip joint. Question vascular calcification versus 8 mm diameter calculus at upper pole LEFT kidney. IMPRESSION: No acute abnormalities. Electronically Signed   By: Lavonia Dana M.D.   On: 03/12/2018 19:28   Dg Abd Portable 1v  Result Date: 03/16/2018 CLINICAL DATA:  Abdominal pain EXAM: PORTABLE ABDOMEN - 1 VIEW COMPARISON:  None. FINDINGS: There is no bowel dilatation or air-fluid level to suggest bowel obstruction. No free air. There is a total hip replacement on the right as well as postoperative change in the lumbar region. There is a filter in the inferior vena cava. There are calcified breast implants  bilaterally. IMPRESSION: No demonstrable bowel obstruction or free air. Electronically Signed   By: Lowella Grip III M.D.   On: 03/16/2018 11:37   Dg Abd Portable 1v  Result Date: 03/08/2018 CLINICAL DATA:  Nausea and vomiting EXAM: PORTABLE ABDOMEN - 1 VIEW COMPARISON:  CT 02/28/2018 FINDINGS: Surgical hardware in the upper lumbar spine. Surgical clips in the right upper quadrant. Nonobstructed bowel-gas pattern. Radiopaque material in the bladder. Probable diverticulum on the right side of the bladder. Status post right hip replacement with fixating screw paralleling the cortex of the right inter pelvis. IMPRESSION: 1. Nonobstructed gas pattern 2. Contrast material within the bladder Electronically Signed   By: Donavan Foil M.D.   On: 03/08/2018 22:30   Ct Angio Abd/pel W/ And/or W/o  Result Date: 03/09/2018 CLINICAL DATA:  Upper abdominal pain and nausea after mesenteric stent placement today. EXAM: CTA ABDOMEN AND PELVIS wITHOUT AND WITH CONTRAST TECHNIQUE: Multidetector CT imaging of the abdomen and pelvis was performed using the standard  protocol during bolus administration of intravenous contrast. Multiplanar reconstructed images and MIPs were obtained and reviewed to evaluate the vascular anatomy. CONTRAST:  100 cc Isovue 370 IV COMPARISON:  Abdomen/pelvis CT 02/28/2018 FINDINGS: VASCULAR Aorta: Moderate atherosclerosis. Normal caliber aorta without aneurysm, dissection, vasculitis or significant stenosis. Celiac: Plaque at the origin causes approximately 50% stenosis. No dissection or vasculitis. Distal branches are patent. SMA: Patent proximal SMA stent with resolved origin stenosis. Calcified distally but patent. No evidence of dissection. No evidence of contrast extravasation. Renals: Calcified plaque at the origins. No dissection or vasculitis. IMA: Patent without evidence of aneurysm, dissection, vasculitis or significant stenosis. Inflow: Patent without evidence of aneurysm, dissection,  vasculitis or significant stenosis. Proximal Outflow: Bilateral common femoral and visualized portions of the superficial and profunda femoral arteries are patent without evidence of aneurysm, dissection, vasculitis or significant stenosis. No evidence of pseudoaneurysm formation or active extravasation at the puncture site. Veins: Not well assessed on arterial phase imaging. There is presumed mixing of opacified and non-opacified blood in the mesenteric venous confluence. Review of the MIP images confirms the above findings. NON-VASCULAR Lower chest: Linear atelectasis in both lower lobes. Trace right pleural effusion. Coronary artery calcifications. Peripherally calcified left breast implant partially included. Hepatobiliary: Slight capsular nodularity raises concern for cirrhosis. No discrete focal lesion. Postcholecystectomy with grossly unchanged biliary prominence allowing for arterial phase technique. Pancreas: Parenchymal atrophy. No ductal dilatation or inflammation. Spleen: Normal in size without focal abnormality. Adrenals/Urinary Tract: Mild left adrenal thickening. Normal right adrenal gland. Excreted IV contrast in both kidneys and urinary bladder from angiogram earlier this day. No hydronephrosis or perinephric edema. Bilateral renal cysts. Stomach/Bowel: Small hiatal hernia. Stomach is nondistended. Presence of intra-abdominal ascites limits bowel evaluation. There is colonic wall thickening involving the ascending colon, hepatic flexure, unlikely proximal transverse colon. No definite small bowel inflammation. Colonic diverticulosis throughout the colon. Lymphatic: No definite adenopathy. No evidence of retroperitoneal hemorrhage. Reproductive: Uterus obscured by right hip arthroplasty. Other: Development of moderate volume abdominopelvic ascites. Fluid may be minimally complex, however does not represent values concerning for hemorrhage. There is mild mesenteric edema. No free air. Small  supraumbilical hernia contains fat and small amount of free fluid. Musculoskeletal: Postsurgical change in the lumbar spine, unchanged from prior. Right hip arthroplasty. IMPRESSION: VASCULAR 1. Placement of superior mesenteric artery stent which is widely patent. No evidence of dissection or postprocedural pseudoaneurysm/hematoma. 2. Diffuse aortic and branch atherosclerosis. NON-VASCULAR 1. Interval development of moderate abdominopelvic ascites from prior exam. Fluid is minimally complex but does not represent hemorrhage. 2. Colonic wall thickening involving the cecum, ascending, and proximal transverse colon. This is new since prior CT. Exact etiology is uncertain. Infectious or inflammatory etiologies are considered, colonic wall edema related to reperfusion could have a similar appearance. No increased density to suggest bowel wall hemorrhage. 3. Colonic diverticulosis without specific findings to suggest diverticulitis. 4. Question of nodular hepatic contours raises concern for cirrhosis. Recommend correlation with cirrhosis risk factors. Electronically Signed   By: Jeb Levering M.D.   On: 03/09/2018 03:18   Ir Paracentesis  Result Date: 03/10/2018 INDICATION: Recent superior mesenteric artery stent placement. New ascites. Request for diagnostic paracentesis. EXAM: ULTRASOUND GUIDED RIGHT LATERAL ABDOMEN PARACENTESIS MEDICATIONS: 2% lidocaine = 10 mL COMPLICATIONS: None immediate. PROCEDURE: Informed written consent was obtained from the patient after a discussion of the risks, benefits and alternatives to treatment. A timeout was performed prior to the initiation of the procedure. Initial ultrasound scanning demonstrates a small amount of ascites within  the right lateral abdomen. The right lateral abdomen was prepped and draped in the usual sterile fashion. 1% lidocaine with epinephrine was used for local anesthesia. Following this, a 19 gauge, 7-cm, Yueh catheter was introduced. An ultrasound image  was saved for documentation purposes. The paracentesis was performed. The catheter was removed and a dressing was applied. The patient tolerated the procedure well without immediate post procedural complication. FINDINGS: A total of approximately 100 ml of clear red fluid was removed. Samples were sent to the laboratory as requested by the clinical team. IMPRESSION: Successful ultrasound-guided paracentesis yielding 126mL of peritoneal fluid. Read by: Gareth Eagle, PA-C Electronically Signed   By: Lucrezia Europe M.D.   On: 03/10/2018 11:13    Lab Data:  CBC: Recent Labs  Lab 03/16/18 1600 03/16/18 2208 03/17/18 0430 03/17/18 0757 03/18/18 0318 03/19/18 0300 03/20/18 0332  WBC 18.9* 17.4* 15.9* 16.6* 15.6* 15.3* 12.6*  NEUTROABS 15.3* 14.1* 13.0* 13.6* 12.9*  --   --   HGB 8.3* 7.9* 7.7* 7.8* 7.9* 8.2* 8.4*  HCT 24.5* 23.9* 23.2* 23.6* 24.2* 26.0* 26.7*  MCV 86.0 86.6 87.9 87.7 89.3 90.9 92.7  PLT 129* 121* 125* 119* 120* 134* 401*   Basic Metabolic Panel: Recent Labs  Lab 03/16/18 0407  03/16/18 1600 03/17/18 0430 03/18/18 0318 03/18/18 1032 03/19/18 0300 03/20/18 0714  NA 135   < > 135 136  --  138 136 139  K 4.1   < > 4.0 3.7  --  3.7 3.7 3.7  CL 109   < > 109 107  --  108 108 107  CO2 16*   < > 17* 22  --  22 20* 24  GLUCOSE 154*   < > 153* 133*  --  116* 107* 100*  BUN 52*   < > 56* 51*  --  41* 35* 25*  CREATININE 3.03*   < > 2.72* 2.15*  --  1.35* 0.99 0.74  CALCIUM 7.8*   < > 7.6* 7.6*  --  7.8* 7.9* 8.0*  MG 2.1  --   --  2.2 2.3  --  2.3 1.8  PHOS 5.3*  --   --  4.0 3.3  --  2.9 3.1   < > = values in this interval not displayed.   GFR: Estimated Creatinine Clearance: 71.1 mL/min (by C-G formula based on SCr of 0.74 mg/dL). Liver Function Tests: No results for input(s): AST, ALT, ALKPHOS, BILITOT, PROT, ALBUMIN in the last 168 hours. Recent Labs  Lab 03/16/18 1132  LIPASE 18   No results for input(s): AMMONIA in the last 168 hours. Coagulation Profile: Recent  Labs  Lab 03/15/18 1328  INR 1.49   Cardiac Enzymes: Recent Labs  Lab 03/13/18 1329 03/13/18 1709 03/14/18 1103  TROPONINI <0.03 <0.03 0.04*   BNP (last 3 results) Recent Labs    08/17/17 1038 09/14/17 0921  PROBNP 612 503   HbA1C: No results for input(s): HGBA1C in the last 72 hours. CBG: Recent Labs  Lab 03/14/18 0324  GLUCAP 177*   Lipid Profile: No results for input(s): CHOL, HDL, LDLCALC, TRIG, CHOLHDL, LDLDIRECT in the last 72 hours. Thyroid Function Tests: No results for input(s): TSH, T4TOTAL, FREET4, T3FREE, THYROIDAB in the last 72 hours. Anemia Panel: No results for input(s): VITAMINB12, FOLATE, FERRITIN, TIBC, IRON, RETICCTPCT in the last 72 hours. Urine analysis:    Component Value Date/Time   COLORURINE AMBER (A) 03/15/2018 1108   APPEARANCEUR CLOUDY (A) 03/15/2018 1108   LABSPEC 1.020  03/15/2018 1108   PHURINE 5.0 03/15/2018 1108   GLUCOSEU NEGATIVE 03/15/2018 1108   HGBUR LARGE (A) 03/15/2018 1108   BILIRUBINUR NEGATIVE 03/15/2018 1108   KETONESUR NEGATIVE 03/15/2018 1108   PROTEINUR 100 (A) 03/15/2018 1108   UROBILINOGEN 1.0 03/30/2011 0901   NITRITE NEGATIVE 03/15/2018 1108   LEUKOCYTESUR SMALL (A) 03/15/2018 1108     Asjia Berrios M.D. [facility] 03/20/2018, 12:19 PM  Pager: [phone] Between 7am to 7pm - call Pager - [phone]  After 7pm go to [email or web address] - password [email or web address]  Call night coverage person covering after 7pm

## 2018-03-21 LAB — PHOSPHORUS: Phosphorus: 2.7 mg/dL (ref 2.5–4.6)

## 2018-03-21 LAB — CBC
HCT: 26.3 % — ABNORMAL LOW (ref 36.0–46.0)
Hemoglobin: 8.2 g/dL — ABNORMAL LOW (ref 12.0–15.0)
MCH: 29.2 pg (ref 26.0–34.0)
MCHC: 31.2 g/dL (ref 30.0–36.0)
MCV: 93.6 fL (ref 78.0–100.0)
Platelets: 154 10*3/uL (ref 150–400)
RBC: 2.81 MIL/uL — ABNORMAL LOW (ref 3.87–5.11)
RDW: 18.8 % — ABNORMAL HIGH (ref 11.5–15.5)
WBC: 11.7 10*3/uL — ABNORMAL HIGH (ref 4.0–10.5)

## 2018-03-21 LAB — BASIC METABOLIC PANEL
Anion gap: 6 (ref 5–15)
BUN: 21 mg/dL — ABNORMAL HIGH (ref 6–20)
CO2: 25 mmol/L (ref 22–32)
Calcium: 8.1 mg/dL — ABNORMAL LOW (ref 8.9–10.3)
Chloride: 107 mmol/L (ref 101–111)
Creatinine, Ser: 0.72 mg/dL (ref 0.44–1.00)
GFR calc Af Amer: >60 mL/min (ref >60)
GFR calc non Af Amer: >60 mL/min (ref >60)
Glucose, Bld: 126 mg/dL — ABNORMAL HIGH (ref 65–99)
Potassium: 3.7 mmol/L (ref 3.5–5.1)
Sodium: 138 mmol/L (ref 135–145)

## 2018-03-21 LAB — MAGNESIUM: Magnesium: 1.6 mg/dL — ABNORMAL LOW (ref 1.7–2.4)

## 2018-03-21 MED ORDER — MAGNESIUM SULFATE 50 % IJ SOLN
3.0000 g | Freq: Once | INTRAVENOUS | Status: AC
  Administered 2018-03-21: 3 g via INTRAVENOUS
  Filled 2018-03-21: qty 6

## 2018-03-21 MED ORDER — POLYETHYLENE GLYCOL 3350 17 G PO PACK
17.0000 g | PACK | Freq: Every day | ORAL | Status: DC
  Administered 2018-03-21 – 2018-03-25 (×5): 17 g via ORAL
  Filled 2018-03-21 (×7): qty 1

## 2018-03-21 MED ORDER — SENNA 8.6 MG PO TABS
1.0000 | ORAL_TABLET | Freq: Two times a day (BID) | ORAL | Status: DC
  Administered 2018-03-21 – 2018-03-26 (×10): 8.6 mg via ORAL
  Filled 2018-03-21 (×10): qty 1

## 2018-03-21 NOTE — Evaluation (Signed)
Occupational Therapy Evaluation Patient Details Name: [name] MRN: [id number] DOB: 1941/03/20 Today's Date: 03/21/2018    History of Present Illness 77 year old female with past medical history of R THA, B TKA, B TSA, back surgery, neck surgery, CAD, PVD and COPD who is had worsening left hip issues that have gotten to the point where she does not ambulate much over the past month and presented to the emergency room on 3/10 with complaints of nausea, weakness and dyspnea on exertion and found to be in acute respiratory failure with hypoxia. Workup discovered multiplesubacute pulmonary embolus on CT scan. 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  During hospitalization patient is now s/p abdominal angiogram and parcentesis on 3/20.  GI was consulted, symptoms thought to be from ischemic colitis.  However hemoglobin continued to drop and on 3/24 AM, patient had a rapid response with hypotension, acute hypoxic respiratory failure, severe abdominal pain.  Repeat CTabdomen pelvis on 3/24 showed a new large anterior abdominal wall hematoma measuring 16.9 x 11.9  X 12.4 cm.  Hemoglobin 6.6.  Heparin drip, Plavix were discontinued, patient was transfused 2 units packed RBC and transferred to ICU.  IVC filter placed on 3/24.  Patient's creatinine function continued to trend up, renal ultrasound did not show any obstruction or hydronephrosis, possibly due to hypoperfusion/ATN.    Clinical Impression   Pt presents to OT session with decreased activity tolerance, generalized weakness, and L hip pain but very motivated to participate with therapies to improve independence. Pt was independent with assistive devices for ADL and functional mobility PTA. She currently requires mod assist for UB ADL, max assist for LB ADL, and min guard assist for seated grooming tasks. She requires multiple therapeutic rest breaks during ADL participation seated at EOB and demonstrates dyspnea  2/4 during ADL tasks. SpO2 92-96% on 2L O2 via [place] with HR 90-107 bpm in a-fib during session. RN present and attending to pt at end of session. Pt would benefit from continued OT services while admitted to improve independence and safety with ADL and functional mobility. Recommend SNF level rehabilitation to improve independence and activity tolerance for ADL prior to returning home. OT will continue to follow while admitted.     Follow Up Recommendations  SNF;Supervision/Assistance - 24 hour    Equipment Recommendations  Other (comment)(defer to next venue of care)    Recommendations for Other Services       Precautions / Restrictions Precautions Precautions: None Restrictions Weight Bearing Restrictions: No      Mobility Bed Mobility Overal bed mobility: Needs Assistance Bed Mobility: Supine to Sit;Sit to Supine     Supine to sit: Max assist Sit to supine: Max assist   General bed mobility comments: Max assist with HOB elevated to manage BLE.   Transfers Overall transfer level: Needs assistance   Transfers: Lateral/Scoot Transfers Sit to Stand: Max assist         General transfer comment: Able to complete 3 lateral scoots toward [facility] for repositioning. Did not attempt standing or pivot transfer this session.     Balance Overall balance assessment: Needs assistance Sitting-balance support: No upper extremity supported;Feet supported Sitting balance-Leahy Scale: Fair                                     ADL either performed or assessed with clinical judgement   ADL Overall ADL's : Needs assistance/impaired  Eating/Feeding: Supervision/ safety;Bed level Eating/Feeding Details (indicate cue type and reason): Reports "everything is choking me." Grooming: Brushing hair;Wash/dry face;Min guard;Sitting Grooming Details (indicate cue type and reason): Required rest breaks during hair brushing.  Upper Body Bathing: Moderate assistance;Sitting   Lower Body  Bathing: Maximal assistance;Sitting/lateral leans   Upper Body Dressing : Moderate assistance;Sitting   Lower Body Dressing: Maximal assistance;Sitting/lateral leans                 General ADL Comments: Pt able to sit at EOB for ADL participation this session. Noted decreased activity tolerance for ADL participation throughout requiring rest breaks during grooming tasks. SpO2 on 2L O2 via  92-96% with HR in A-fib 90-107 bpm.     Vision   Additional Comments: Will continue to assess.     Perception     Praxis      Pertinent Vitals/Pain Pain Assessment: Faces Faces Pain Scale: Hurts even more Pain Location: L hip (chronic OA pain per pt) Pain Descriptors / Indicators: Sore Pain Intervention(s): Limited activity within patient's tolerance;Monitored during session;Repositioned     Hand Dominance Right   Extremity/Trunk Assessment Upper Extremity Assessment Upper Extremity Assessment: Generalized weakness;LUE deficits/detail LUE Deficits / Details: Shoulder flexion limited to approximately 0-50 degrees secondary to failed reverse TSA.    Lower Extremity Assessment Lower Extremity Assessment: Defer to PT evaluation       Communication Communication Communication: No difficulties   Cognition Arousal/Alertness: Awake/alert Behavior During Therapy: WFL for tasks assessed/performed Overall Cognitive Status: Within Functional Limits for tasks assessed                                     General Comments       Exercises     Shoulder Instructions      Home Living Family/patient expects to be discharged to:: Private residence Living Arrangements: Alone Available Help at Discharge: Family;Available PRN/intermittently Type of Home: House Home Access: Stairs to enter [employer] of Steps: 3 Entrance Stairs-Rails: Right Home Layout: One level     Bathroom Shower/Tub: Occupational psychologist: Standard     Home Equipment:  Environmental consultant - 4 wheels;Bedside commode;Shower seat;Wheelchair - manual          Prior Functioning/Environment Level of Independence: Independent with assistive device(s)        Comments: walks with rollator        OT Problem List: Decreased strength;Decreased range of motion;Decreased activity tolerance;Impaired balance (sitting and/or standing);Decreased safety awareness;Decreased knowledge of use of DME or AE;Decreased knowledge of precautions;Cardiopulmonary status limiting activity;Pain      OT Treatment/Interventions: Self-care/ADL training;Therapeutic exercise;Energy conservation;DME and/or AE instruction;Therapeutic activities;Patient/family education;Balance training    OT Goals(Current goals can be found in the care plan section) Acute Rehab OT Goals Patient Stated Goal: return to independence with mobility OT Goal Formulation: With patient/family Time For Goal Achievement: 04/04/18 Potential to Achieve Goals: Good ADL Goals Pt Will Perform Grooming: with modified independence;sitting Pt Will Transfer to Toilet: with min assist;stand pivot transfer;bedside commode Pt Will Perform Toileting - Clothing Manipulation and hygiene: with min assist;sit to/from stand Pt/caregiver will Perform Home Exercise Program: Increased strength;Both right and left upper extremity;With written HEP provided;With Supervision Additional ADL Goal #1: Pt will sit at EOB to participate in ADL tasks for 10 minutes with stable vital signs. Additional ADL Goal #2: Pt will complete bed mobility in preparation for ADL participation seated at  EOB with overall min assist.  OT Frequency: Min 2X/week   Barriers to D/C:            Co-evaluation              AM-PAC PT "6 Clicks" Daily Activity     Outcome Measure Help from another person eating meals?: A Little Help from another person taking care of personal grooming?: A Little Help from another person toileting, which includes using toliet,  bedpan, or urinal?: A Lot Help from another person bathing (including washing, rinsing, drying)?: A Lot Help from another person to put on and taking off regular upper body clothing?: A Lot Help from another person to put on and taking off regular lower body clothing?: A Lot 6 Click Score: 14   End of Session Equipment Utilized During Treatment: Gait belt;Rolling walker Nurse Communication: Mobility status(pt needs new Pure Whick)  Activity Tolerance: Patient tolerated treatment well Patient left: in bed;with call bell/phone within reach;with nursing/sitter in room  OT Visit Diagnosis: Other abnormalities of gait and mobility (R26.89);Pain;Muscle weakness (generalized) (M62.81) Pain - Right/Left: Left Pain - part of body: Leg;Hip                Time: 1425-1450 OT Time Calculation (min): 25 min Charges:  OT General Charges $OT Visit: 1 Visit OT Evaluation $OT Eval Moderate Complexity: 1 Mod OT Treatments $Self Care/Home Management : 8-22 mins G-Codes:     Norman Herrlich, MS OTR/L  Pager: [name] 03/21/2018, 4:21 PM

## 2018-03-21 NOTE — Progress Notes (Signed)
Received pt.  A/O x person and place. Has pain in abdomen and legs. Foley removed as ordered.  Idolina Primer, RN

## 2018-03-21 NOTE — Progress Notes (Signed)
Report  Called to 6E 04 RN, tx w/ vss recvd by RNs and NT. Report confirmed at bs

## 2018-03-21 NOTE — Progress Notes (Addendum)
[facility]                                                                              Patient Demographics  [name], is a 77 y.o. female, DOB - 02-19-1941, [place] date - 02/28/2018   Admitting Physician Etta Quill, DO  Outpatient Primary MD for the patient is [name], MD  Outpatient specialists:   LOS - 20  days   Medical records reviewed and are as summarized below:    Chief Complaint  Patient presents with  . Nausea       Brief summary   77 y/o female with CAD, COPD, prior PE, history of small bowel AVMs and had been off anticoagulants and since February 2018 was admitted on 3/10 with nausea, weakness and dyspnea on exertion, acute respiratory failure with hypoxia.  Workup showed multiple subacute pulmonary embolus on CT scan as well as large acute DVT in the left femoral vein.  She was incidentally found to have severe stenosis of superior mesenteric artery.  Patient was started on heparin drip, admitted to hospitalist service, had stenting of SMA on 3/18.  Since the stent placement, she had worsening abdominal pain, nausea and vomiting.  CTA abdomen and pelvis on 3/19 revealed patent SMA stent with moderate abdominopelvic ascites, colonic wall thickening.  GI was consulted, symptoms thought to be from ischemic colitis.  However hemoglobin continued to drop and on 3/24 AM, patient had a rapid response with hypotension, acute hypoxic respiratory failure, severe abdominal pain.  Repeat CTabdomen pelvis on 3/24 showed a new large anterior abdominal wall hematoma measuring 16.9 x 11.9  X 12.4 cm.  Hemoglobin 6.6.  Heparin drip, Plavix were discontinued, patient was transfused 2 units packed RBC and transferred to ICU.  She was placed on vasopressors per IVC filter placed on 3/24.  Patient's creatinine function continued to trend up, renal ultrasound did not show any obstruction or hydronephrosis, possibly due to hypoperfusion/ATN.  For  the new onset ascites, patient underwent paracentesis on 3/20 which showed SAAG >1.1 Vascular surgery following patient, recommended starting aspirin once hemoglobin has remained stable.   Assessment & Plan    Principal Problem: Right-sided ischemic colitis, superior mesenteric artery stenosis status post stent placement to SMA on 3/18.  -Nausea, vomiting, abdominal pain improved.  Patient tolerating soft solids.  -Vascular surgery following, continue aspirin 81 mg daily -Antibiotics completed 3/27 however low threshold for initiating if fevers or worsening leukocytosis.   Active Problems: Acute hypoxic respiratory failure with pulmonary emboli, left femoral DVT, COPD -Status post IVC filter on 3/24 -Wean O2 as tolerated, currently 97% on 2 L.   -Continue Lasix 40 mg daily, duo nebs as needed   Acute on chronic blood loss anemia, hemorrhagic shock, rectus sheath hematoma, abdominal pain -H&H currently stable, no acute issues -Hemorrhagic shock resolved, had briefly required vasopressors  Acute kidney injury with non-anion gap metabolic acidosis -Possibly due to hypoperfusion, ATN and contrast.  Baseline creatinine 0.7-1 -Creatinine improving, peaked at 3.2 -Renal ultrasound did not show any obstruction or hydronephrosis -Monitor closely with Lasix  New ascites status post paracentesis 3/20 -  SAAG >1.1, felt inflammatory, CT scan with cirrhosis, ischemic colitis -GI following  Urinary retention -Patient had an episode of urinary retention during hospitalization, requiring Foley catheter a week ago, will DC Foley and attempt voiding trial today.  Hypomagnesemia -Magnesium 1.6, replaced  Code Status: Partial DVT Prophylaxis: SCD Family Communication: Discussed in detail with the patient, all imaging results, lab results explained to the patient and daughter at the bedside  Disposition Plan: Transfer to telemetry.  PT evaluation recommended skilled nursing facility, social work  consulted  Time Spent in minutes : 25  Procedures:   3/18 SMA stenosis status post stent CT ABD /Pelvis 3/19>Interval development of moderate abdominopelvic ascites,Colonic wall thickening involving the cecum, ascending, proximal transverse colon 3/20 status post paracentesis with 100 cc removed CT abd /pelvis 3/24>>New, large anterior abdominal wall hematoma measuring 16.9 x 11.9x 12.4 cm.2. Small volume ascites with nodular appearance of the liver concerning for cirrhosis.. Medium-sized right pleural effusion 3/24: Retrievable IVC filter placed  Consultants:   PCCM Vascular surgery IR Gastroenterology  Antimicrobials:      Medications  Scheduled Meds: . aspirin EC  81 mg Oral Daily  . chlorhexidine  15 mL Mouth Rinse BID  . cholecalciferol  1,000 Units Oral Daily  . citalopram  30 mg Oral Daily  . docusate sodium  100 mg Oral BID  . feeding supplement (ENSURE ENLIVE)  237 mL Oral TID BM  . ferrous sulfate  325 mg Oral Q breakfast  . furosemide  40 mg Oral Daily  . ipratropium-albuterol  3 mL Inhalation BID  . lidocaine  1 patch Transdermal Q24H  . mouth rinse  15 mL Mouth Rinse q12n4p  . pantoprazole  40 mg Oral Daily  . pilocarpine  1 drop Both Eyes BID  . polyethylene glycol  17 g Oral Daily  . pramipexole  0.25 mg Oral QHS  . senna  1 tablet Oral BID  . sodium chloride flush  3 mL Intravenous Q12H  . sodium chloride flush  3 mL Intravenous Q12H   Continuous Infusions: . magnesium sulfate LVP 250-500 ml 3 g (03/21/18 1148)  . sodium chloride     PRN Meds:.acetaminophen **OR** acetaminophen, HYDROmorphone (DILAUDID) injection, ipratropium-albuterol, metoCLOPramide (REGLAN) injection, oxyCODONE, prochlorperazine, sodium chloride flush, sodium chloride flush, traZODone   Antibiotics   Anti-infectives (From admission, onward)   Start     Dose/Rate Route Frequency Ordered Stop   03/16/18 0600  ciprofloxacin (CIPRO) IVPB 200 mg  Status:  Discontinued     200  mg 100 mL/hr over 60 Minutes Intravenous Every 24 hours 03/15/18 0933 03/17/18 1223   03/15/18 0600  ciprofloxacin (CIPRO) IVPB 400 mg  Status:  Discontinued     400 mg 200 mL/hr over 60 Minutes Intravenous Every 24 hours 03/14/18 1259 03/15/18 0933   03/13/18 1800  ciprofloxacin (CIPRO) IVPB 400 mg  Status:  Discontinued     400 mg 200 mL/hr over 60 Minutes Intravenous Every 12 hours 03/13/18 1711 03/14/18 1259   03/13/18 1800  metroNIDAZOLE (FLAGYL) IVPB 500 mg  Status:  Discontinued     500 mg 100 mL/hr over 60 Minutes Intravenous Every 8 hours 03/13/18 1711 03/16/18 1100   03/10/18 1230  metroNIDAZOLE (FLAGYL) IVPB 500 mg  Status:  Discontinued     500 mg 100 mL/hr over 60 Minutes Intravenous Every 8 hours 03/10/18 1208 03/10/18 1815   03/10/18 1230  ciprofloxacin (CIPRO) IVPB 400 mg  Status:  Discontinued     400 mg 200 mL/hr over  60 Minutes Intravenous Every 12 hours 03/10/18 1208 03/10/18 1815        Subjective:   [name] was seen and examined today.  No new complaints, tolerating solid diet, no nausea, vomiting or significant abdominal pain.  No chest pain, fevers or chills.  Denies any new weakness, numbness or tingling.  No diarrhea   Objective:   Vitals:   03/21/18 0900 03/21/18 1000 03/21/18 1100 03/21/18 1112  BP:    125/61  Pulse: (!) 109 78  (!) 101  Resp: 19 17 17 17   Temp:    98 F (36.7 C)  TempSrc:    Oral  SpO2: 96% 98%  97%  Weight:      Height:        Intake/Output Summary (Last 24 hours) at 03/21/2018 1210 Last data filed at 03/21/2018 1100 Gross per 24 hour  Intake 2032 ml  Output 1180 ml  Net 852 ml     Wt Readings from Last 3 Encounters:  03/21/18 95.7 kg (210 lb 15.7 oz)  11/19/17 84.1 kg (185 lb 6.4 oz)  09/14/17 78.6 kg (173 lb 3.2 oz)     Exam   General: Alert and oriented x 3, NAD  Eyes: ,  HEENT:   Cardiovascular: S1 S2 clear. Regular rate and rhythm.3+ pedal edema b/l  Respiratory: No wheezing, decreased breath  sound at the bases  Gastrointestinal: Soft, nontender, nondistended, + bowel sounds  Ext: 3+ pedal edema bilaterally  Neuro: no new deficits  Musculoskeletal: No digital cyanosis, clubbing  Skin: No rashes  Psych: Normal affect and demeanor, alert and oriented x3    Data Reviewed:  I have personally reviewed following labs and imaging studies  Micro Results Recent Results (from the past 240 hour(s))  Culture, Urine     Status: Abnormal   Collection Time: 03/13/18  4:42 PM  Result Value Ref Range Status   Specimen Description URINE, CATHETERIZED  Final   Special Requests NONE  Final   Culture (A)  Final    <10,000 COLONIES/mL INSIGNIFICANT GROWTH Performed at [facility], [address] [place], [place] [address]    Report Status 03/14/2018 FINAL  Final  Culture, blood (Routine X 2) w Reflex to ID Panel     Status: None   Collection Time: 03/14/18 11:15 AM  Result Value Ref Range Status   Specimen Description BLOOD LEFT HAND  Final   Special Requests   Final    BOTTLES DRAWN AEROBIC ONLY Blood Culture adequate volume   Culture   Final    NO GROWTH 5 DAYS Performed at [facility], [place] [address] [place], [place] [address]    Report Status 03/19/2018 FINAL  Final  Culture, blood (Routine X 2) w Reflex to ID Panel     Status: None   Collection Time: 03/14/18 11:17 AM  Result Value Ref Range Status   Specimen Description BLOOD LEFT HAND  Final   Special Requests   Final    BOTTLES DRAWN AEROBIC ONLY Blood Culture results may not be optimal due to an inadequate volume of blood received in culture bottles   Culture   Final    NO GROWTH 5 DAYS Performed at [facility], [place] [address], [place], [place] [address]    Report Status 03/19/2018 FINAL  Final    Radiology Reports Ct Abdomen Pelvis Wo Contrast  Addendum Date: 03/14/2018   ADDENDUM REPORT: 03/14/2018 04:14 ADDENDUM: These results were called by telephone  at the time of interpretation on  03/14/2018 at 4:04 am to [facility] , who verbally acknowledged these results. Electronically Signed   By: Ulyses Jarred M.D.   On: 03/14/2018 04:14   Result Date: 03/14/2018 CLINICAL DATA:  Abdominal pain and fever EXAM: CT ABDOMEN AND PELVIS WITHOUT CONTRAST TECHNIQUE: Multidetector CT imaging of the abdomen and pelvis was performed following the standard protocol without IV contrast. COMPARISON:  CT abdomen pelvis 03/09/2018 FINDINGS: Examination is degraded by streak artifact from external patient devices and from the arms down positioning of the patient, creating multiple streak artifacts and decreasing sensitivity and specificity of this scan. Lower chest: Right pleural effusion and associated atelectasis. Hepatobiliary: Right upper quadrant ascites. Nodular appearance of the liver. No focal liver lesion. Status post cholecystectomy. Pancreas: Atrophic appearance of the pancreas. Spleen: Normal. Adrenals/Urinary Tract: --Adrenal glands: Normal. --Right kidney/ureter: No hydronephrosis, perinephric stranding or nephrolithiasis. No obstructing ureteral stones. --Left kidney/ureter: No hydronephrosis, perinephric stranding or nephrolithiasis. No obstructing ureteral stones. --Urinary bladder: Normal appearance for the degree of distention. Stomach/Bowel: --Stomach/Duodenum: No hiatal hernia or other gastric abnormality. Normal duodenal course. --Small bowel: No dilatation or inflammation. --Colon: Diffuse colonic diverticulosis without acute inflammation. --Appendix: Normal. Vascular/Lymphatic: Atherosclerotic calcification is present within the non-aneurysmal abdominal aorta, without hemodynamically significant stenosis. No abdominal or pelvic lymphadenopathy. Reproductive: Pelvis largely obscured by streak artifact. Musculoskeletal. Right total hip arthroplasty. L1-L3 posterior spinal fusion. Other: Large anterior abdominal wall hematoma measuring up to 16.9 x 11.9 x 12.4 cm. This is new compared to the  recent study from 03/09/2018. This exerts mass effect on the intra-abdominal contents. IMPRESSION: 1. New, large anterior abdominal wall hematoma measuring 16.9 x 11.9 x 12.4 cm. 2. Small volume ascites with nodular appearance of the liver concerning for cirrhosis. 3. Medium-sized right pleural effusion with associated atelectasis. 4. Diffuse colonic diverticulosis without acute inflammation. 5.  Aortic Atherosclerosis (ICD10-I70.0). Electronically Signed: By: Ulyses Jarred M.D. On: 03/14/2018 03:45   Ct Angio Chest Pe W/cm &/or Wo Cm  Result Date: 03/01/2018 CLINICAL DATA:  Subacute onset of nausea. Generalized abdominal pain. Mild generalized chest pain and shortness of breath. EXAM: CT ANGIOGRAPHY CHEST CT ABDOMEN AND PELVIS WITH CONTRAST TECHNIQUE: Multidetector CT imaging of the chest was performed using the standard protocol during bolus administration of intravenous contrast. Multiplanar CT image reconstructions and MIPs were obtained to evaluate the vascular anatomy. Multidetector CT imaging of the abdomen and pelvis was performed using the standard protocol during bolus administration of intravenous contrast. CONTRAST:  80 mL of Isovue 370 IV contrast COMPARISON:  CT of the abdomen and pelvis performed 11/08/2017, and CT of the thoracic spine performed 07/26/2014 FINDINGS: CTA CHEST FINDINGS Cardiovascular: There appears to be subacute or chronic pulmonary embolus within the pulmonary artery to the left lower lobe, extending minimally into segmental branches. No additional pulmonary embolus is identified. The RV/LV ratio of 1.1 may reflect right heart strain. Diffuse coronary artery calcifications are seen. Diffuse calcification is noted along the aortic arch and proximal great vessels. The heart remains normal in size. The patient is status post median sternotomy. There is a chronic fracture of the inferior-most sternal wire. Mediastinum/Nodes: The mediastinum is otherwise unremarkable in appearance.  No mediastinal lymphadenopathy is seen. No pericardial effusion is identified. The thyroid gland is grossly unremarkable. No axillary lymphadenopathy is seen. Lungs/Pleura: Minimal bilateral atelectasis is noted. The lungs are otherwise clear. Mild scarring is noted at the lung apices. No significant focal consolidation, pleural effusion or pneumothorax is seen. No  masses are identified. Musculoskeletal: No acute osseous abnormalities are identified. The patient's right shoulder arthroplasty is incompletely imaged but appears grossly unremarkable. The visualized musculature is unremarkable in appearance. Diffuse calcification is noted about the patient's bilateral breast implants. Review of the MIP images confirms the above findings. CT ABDOMEN and PELVIS FINDINGS Hepatobiliary: The liver is unremarkable in appearance. The patient is status post cholecystectomy, with clips noted at the gallbladder fossa. There is corresponding distention of the intrahepatic biliary ducts and common bile duct. Pancreas: The pancreas is atrophic and grossly unremarkable in appearance. Spleen: The spleen is unremarkable in appearance. Adrenals/Urinary Tract: Minimal calcification is noted at the left adrenal gland, likely adjacent to a small underlying adrenal lesion. The right adrenal gland is unremarkable. A right renal cyst is again noted, better characterized on the prior study. The kidneys are otherwise unremarkable. There is no evidence of hydronephrosis. No perinephric stranding is seen. Stomach/Bowel: The stomach is unremarkable in appearance. The small bowel is within normal limits. The appendix is normal in caliber, without evidence of appendicitis. The colon is unremarkable in appearance. Vascular/Lymphatic: Diffuse calcification is seen along the abdominal aorta and its branches, including along the right renal artery and superior mesenteric artery. There is likely relatively severe luminal narrowing at the proximal  superior mesenteric artery. The abdominal aorta is otherwise grossly unremarkable. The inferior vena cava is grossly unremarkable. No retroperitoneal lymphadenopathy is seen. No pelvic sidewall lymphadenopathy is identified. Reproductive: The bladder is mildly distended and grossly unremarkable. The uterus is unremarkable in appearance. The ovaries are grossly symmetric. No suspicious adnexal masses are seen. Other: No additional soft tissue abnormalities are seen. Musculoskeletal: No acute osseous abnormalities are identified. The patient's right hip arthroplasty is incompletely imaged, but appears grossly unremarkable. The patient is status post lumbar spinal fusion at L1-L3, with underlying decompression from L1-L4. The visualized musculature is unremarkable in appearance. Review of the MIP images confirms the above findings. IMPRESSION: 1. Apparent subacute or chronic pulmonary embolus within the pulmonary artery to the left lower lung lobe, extending minimally into segmental branches. RV/LV ratio of 1.1 may reflect right heart strain. Would correlate with the patient's symptoms. 2. Minimal bilateral atelectasis noted. Mild scarring at the lung apices. 3. Diffuse coronary artery calcifications seen. 4. Diffuse aortic atherosclerosis, with diffuse calcification along the right renal artery and superior mesenteric artery. Likely relatively severe luminal narrowing at the proximal superior mesenteric artery. Would correlate for any associated symptoms. Critical Value/emergent results were called by telephone at the time of interpretation on 03/01/2018 at 1:00 am to the ER clinical team, who verbally acknowledged these results. Electronically Signed   By: Garald Balding M.D.   On: 03/01/2018 01:03   Ct Abdomen Pelvis W Contrast  Result Date: 03/01/2018 CLINICAL DATA:  Subacute onset of nausea. Generalized abdominal pain. Mild generalized chest pain and shortness of breath. EXAM: CT ANGIOGRAPHY CHEST CT ABDOMEN  AND PELVIS WITH CONTRAST TECHNIQUE: Multidetector CT imaging of the chest was performed using the standard protocol during bolus administration of intravenous contrast. Multiplanar CT image reconstructions and MIPs were obtained to evaluate the vascular anatomy. Multidetector CT imaging of the abdomen and pelvis was performed using the standard protocol during bolus administration of intravenous contrast. CONTRAST:  80 mL of Isovue 370 IV contrast COMPARISON:  CT of the abdomen and pelvis performed 11/08/2017, and CT of the thoracic spine performed 07/26/2014 FINDINGS: CTA CHEST FINDINGS Cardiovascular: There appears to be subacute or chronic pulmonary embolus within the pulmonary artery to the  left lower lobe, extending minimally into segmental branches. No additional pulmonary embolus is identified. The RV/LV ratio of 1.1 may reflect right heart strain. Diffuse coronary artery calcifications are seen. Diffuse calcification is noted along the aortic arch and proximal great vessels. The heart remains normal in size. The patient is status post median sternotomy. There is a chronic fracture of the inferior-most sternal wire. Mediastinum/Nodes: The mediastinum is otherwise unremarkable in appearance. No mediastinal lymphadenopathy is seen. No pericardial effusion is identified. The thyroid gland is grossly unremarkable. No axillary lymphadenopathy is seen. Lungs/Pleura: Minimal bilateral atelectasis is noted. The lungs are otherwise clear. Mild scarring is noted at the lung apices. No significant focal consolidation, pleural effusion or pneumothorax is seen. No masses are identified. Musculoskeletal: No acute osseous abnormalities are identified. The patient's right shoulder arthroplasty is incompletely imaged but appears grossly unremarkable. The visualized musculature is unremarkable in appearance. Diffuse calcification is noted about the patient's bilateral breast implants. Review of the MIP images confirms the  above findings. CT ABDOMEN and PELVIS FINDINGS Hepatobiliary: The liver is unremarkable in appearance. The patient is status post cholecystectomy, with clips noted at the gallbladder fossa. There is corresponding distention of the intrahepatic biliary ducts and common bile duct. Pancreas: The pancreas is atrophic and grossly unremarkable in appearance. Spleen: The spleen is unremarkable in appearance. Adrenals/Urinary Tract: Minimal calcification is noted at the left adrenal gland, likely adjacent to a small underlying adrenal lesion. The right adrenal gland is unremarkable. A right renal cyst is again noted, better characterized on the prior study. The kidneys are otherwise unremarkable. There is no evidence of hydronephrosis. No perinephric stranding is seen. Stomach/Bowel: The stomach is unremarkable in appearance. The small bowel is within normal limits. The appendix is normal in caliber, without evidence of appendicitis. The colon is unremarkable in appearance. Vascular/Lymphatic: Diffuse calcification is seen along the abdominal aorta and its branches, including along the right renal artery and superior mesenteric artery. There is likely relatively severe luminal narrowing at the proximal superior mesenteric artery. The abdominal aorta is otherwise grossly unremarkable. The inferior vena cava is grossly unremarkable. No retroperitoneal lymphadenopathy is seen. No pelvic sidewall lymphadenopathy is identified. Reproductive: The bladder is mildly distended and grossly unremarkable. The uterus is unremarkable in appearance. The ovaries are grossly symmetric. No suspicious adnexal masses are seen. Other: No additional soft tissue abnormalities are seen. Musculoskeletal: No acute osseous abnormalities are identified. The patient's right hip arthroplasty is incompletely imaged, but appears grossly unremarkable. The patient is status post lumbar spinal fusion at L1-L3, with underlying decompression from L1-L4. The  visualized musculature is unremarkable in appearance. Review of the MIP images confirms the above findings. IMPRESSION: 1. Apparent subacute or chronic pulmonary embolus within the pulmonary artery to the left lower lung lobe, extending minimally into segmental branches. RV/LV ratio of 1.1 may reflect right heart strain. Would correlate with the patient's symptoms. 2. Minimal bilateral atelectasis noted. Mild scarring at the lung apices. 3. Diffuse coronary artery calcifications seen. 4. Diffuse aortic atherosclerosis, with diffuse calcification along the right renal artery and superior mesenteric artery. Likely relatively severe luminal narrowing at the proximal superior mesenteric artery. Would correlate for any associated symptoms. Critical Value/emergent results were called by telephone at the time of interpretation on 03/01/2018 at 1:00 am to the ER clinical team, who verbally acknowledged these results. Electronically Signed   By: Garald Balding M.D.   On: 03/01/2018 01:03   Ir Ivc Filter Plmt / S&i /img Guid/mod Sed  Result Date:  03/14/2018 INDICATION: 77 year old with history of pulmonary embolism and left lower extremity DVT. Patient now has a large abdominal wall hematoma and no longer candidate for anticoagulation. EXAM: IVC FILTER PLACEMENT; IVC VENOGRAM; ULTRASOUND FOR VASCULAR ACCESS Physician: Stephan Minister. Anselm Pancoast, MD MEDICATIONS: None. ANESTHESIA/SEDATION: Fentanyl 25 mcg IV; Versed 0.5 mg IV Moderate Sedation Time:  22 minutes The patient was continuously monitored during the procedure by the interventional radiology nurse under my direct supervision. CONTRAST:  Carbon dioxide FLUOROSCOPY TIME:  Fluoroscopy Time: 7 minutes 6 seconds (327 mGy). COMPLICATIONS: None immediate. PROCEDURE: Informed consent was obtained for an IVC venogram and filter placement. Ultrasound demonstrated a patent right internal jugular vein. Ultrasound images were obtained for documentation. The right side of the neck was prepped  and draped in a sterile fashion. Maximal barrier sterile technique was utilized including caps, mask, sterile gowns, sterile gloves, sterile drape, hand hygiene and skin antiseptic. The skin was anesthetized with 1% lidocaine. A 21 gauge needle was directed into the vein with ultrasound guidance and a micropuncture dilator set was placed. A wire was advanced into the IVC. The filter sheath was advanced over the wire into the IVC. An IVC venogram was performed with carbon dioxide. Limited evaluation of the IVC due to carbon dioxide and the lumbar surgical hardware. Therefore, a MPA catheter was used to cannulate bilateral renal veins. Renal venography was also performed with carbon dioxide bilaterally. The location of the renal veins were confidently identified. Fluoroscopic images were obtained for documentation. A Bard Denali filter was deployed in the IVC below the lowest renal vein. Follow-up images were obtained to confirm adequate deployment of the filter. Vascular sheath was removed with manual compression. FINDINGS: IVC was patent. Bilateral renal veins were identified. The filter was deployed below the lowest renal vein. IMPRESSION: Successful placement of a retrievable IVC filter. PLAN: This IVC filter is potentially retrievable. The patient will be assessed for filter retrieval by Interventional Radiology in approximately 8-12 weeks. Further recommendations regarding filter retrieval, continued surveillance or declaration of device permanence, will be made at that time. Electronically Signed   By: Markus Daft M.D.   On: 03/14/2018 13:03   US Renal  Result Date: 03/15/2018 CLINICAL DATA:  Acute kidney injury. EXAM: RENAL / URINARY TRACT ULTRASOUND COMPLETE COMPARISON:  CT, 03/14/2018. FINDINGS: Right Kidney: Length: 8.6 cm. Diffuse cortical thinning. Increased renal parenchymal echogenicity. There is a 2.6 x 2.0 x 2.3 cm cyst arising from the lower pole. No other masses, no stones and no hydronephrosis.  Left Kidney: Length: 9.2 cm. Mild diffuse cortical thinning. Borderline increased renal parenchymal echogenicity. No mass, stone or hydronephrosis. Lower pole partly obscured by overlying bowel gas. Bladder: Decompressed with a Foley catheter.  Not well assessed. Patient also has ascites and a right pleural effusion. IMPRESSION: 1. Findings consistent with medical renal disease with renal cortical thinning, greater on the right, and increased renal parenchymal echogenicity on the right and borderline increased renal parenchymal echogenicity on the left. 2. No acute findings.  No hydronephrosis. 3. 2.6 cm right lower pole renal cyst. Electronically Signed   By: Lajean Manes M.D.   On: 03/15/2018 15:45   Dg Chest Port 1 View  Result Date: 03/18/2018 CLINICAL DATA:  Dyspnea, bilateral leg and arm swelling, abdominal pain and nausea for the past 3-4 days. History of COPD, CHF, and previous CABG. EXAM: PORTABLE CHEST 1 VIEW COMPARISON:  Portable chest x-ray of March 15, 2018 FINDINGS: The lungs are well-expanded. There is no focal infiltrate. There is  no pleural effusion. The cardiac silhouette is enlarged. The pulmonary vascularity is not engorged. There are post CABG changes. There is calcification in the wall of the thoracic aorta. There are bilateral breast implants exhibiting capsular calcification. IMPRESSION: COPD. Mild cardiomegaly. Previous CABG. No pulmonary edema, pneumonia, or pleural effusion. Thoracic aortic atherosclerosis. Electronically Signed   By: David  Martinique M.D.   On: 03/18/2018 09:15   Dg Chest Port 1 View  Result Date: 03/15/2018 CLINICAL DATA:  Pleural effusion. EXAM: PORTABLE CHEST 1 VIEW COMPARISON:  Radiograph of March 13, 2018. FINDINGS: The heart size and mediastinal contours are within normal limits. Atherosclerosis of thoracic aorta is noted. Status post coronary artery bypass graft. No pneumothorax is noted. Increased right basilar opacity is noted concerning for worsening  atelectasis or infiltrate with associated pleural effusion. Status post right shoulder arthroplasty. IMPRESSION: Increased right basilar atelectasis or infiltrate is noted with possible associated pleural effusion. Aortic Atherosclerosis (ICD10-I70.0). Electronically Signed   By: Marijo Conception, M.D.   On: 03/15/2018 07:45   Dg Chest Port 1 View  Result Date: 03/13/2018 CLINICAL DATA:  77 year old with shortness of breath. EXAM: PORTABLE CHEST 1 VIEW COMPARISON:  Chest CT 02/28/2018 and chest radiograph 11/08/2017 FINDINGS: Bilateral breast implants. No focal airspace disease or pulmonary edema. Right shoulder replacement. Atherosclerotic calcifications at the aortic arch. Prior CABG procedure. Sclerosis and degenerative changes at the left shoulder. Surgical hardware in the lumbar spine. Heart size is within normal limits. IMPRESSION: No acute chest findings. Electronically Signed   By: Markus Daft M.D.   On: 03/13/2018 12:52   Dg Abd 2 Views  Result Date: 03/12/2018 CLINICAL DATA:  Lower abdominal pain beginning this morning post vascular surgery EXAM: ABDOMEN - 2 VIEW COMPARISON:  03/08/2018 FINDINGS: Normal bowel gas pattern. No bowel dilatation or bowel wall thickening. Gas present in rectum. Bones demineralized with evidence of prior lumbar fusion and prior RIGHT hip replacement. Scattered degenerative changes of lower thoracic spine. Surgical clips RIGHT upper quadrant question cholecystectomy. Degenerative changes LEFT hip joint. Question vascular calcification versus 8 mm diameter calculus at upper pole LEFT kidney. IMPRESSION: No acute abnormalities. Electronically Signed   By: Lavonia Dana M.D.   On: 03/12/2018 19:28   Dg Abd Portable 1v  Result Date: 03/16/2018 CLINICAL DATA:  Abdominal pain EXAM: PORTABLE ABDOMEN - 1 VIEW COMPARISON:  None. FINDINGS: There is no bowel dilatation or air-fluid level to suggest bowel obstruction. No free air. There is a total hip replacement on the right as well  as postoperative change in the lumbar region. There is a filter in the inferior vena cava. There are calcified breast implants bilaterally. IMPRESSION: No demonstrable bowel obstruction or free air. Electronically Signed   By: Lowella Grip III M.D.   On: 03/16/2018 11:37   Dg Abd Portable 1v  Result Date: 03/08/2018 CLINICAL DATA:  Nausea and vomiting EXAM: PORTABLE ABDOMEN - 1 VIEW COMPARISON:  CT 02/28/2018 FINDINGS: Surgical hardware in the upper lumbar spine. Surgical clips in the right upper quadrant. Nonobstructed bowel-gas pattern. Radiopaque material in the bladder. Probable diverticulum on the right side of the bladder. Status post right hip replacement with fixating screw paralleling the cortex of the right inter pelvis. IMPRESSION: 1. Nonobstructed gas pattern 2. Contrast material within the bladder Electronically Signed   By: Donavan Foil M.D.   On: 03/08/2018 22:30   Ct Angio Abd/pel W/ And/or W/o  Result Date: 03/09/2018 CLINICAL DATA:  Upper abdominal pain and nausea after mesenteric stent placement  today. EXAM: CTA ABDOMEN AND PELVIS wITHOUT AND WITH CONTRAST TECHNIQUE: Multidetector CT imaging of the abdomen and pelvis was performed using the standard protocol during bolus administration of intravenous contrast. Multiplanar reconstructed images and MIPs were obtained and reviewed to evaluate the vascular anatomy. CONTRAST:  100 cc Isovue 370 IV COMPARISON:  Abdomen/pelvis CT 02/28/2018 FINDINGS: VASCULAR Aorta: Moderate atherosclerosis. Normal caliber aorta without aneurysm, dissection, vasculitis or significant stenosis. Celiac: Plaque at the origin causes approximately 50% stenosis. No dissection or vasculitis. Distal branches are patent. SMA: Patent proximal SMA stent with resolved origin stenosis. Calcified distally but patent. No evidence of dissection. No evidence of contrast extravasation. Renals: Calcified plaque at the origins. No dissection or vasculitis. IMA: Patent without  evidence of aneurysm, dissection, vasculitis or significant stenosis. Inflow: Patent without evidence of aneurysm, dissection, vasculitis or significant stenosis. Proximal Outflow: Bilateral common femoral and visualized portions of the superficial and profunda femoral arteries are patent without evidence of aneurysm, dissection, vasculitis or significant stenosis. No evidence of pseudoaneurysm formation or active extravasation at the puncture site. Veins: Not well assessed on arterial phase imaging. There is presumed mixing of opacified and non-opacified blood in the mesenteric venous confluence. Review of the MIP images confirms the above findings. NON-VASCULAR Lower chest: Linear atelectasis in both lower lobes. Trace right pleural effusion. Coronary artery calcifications. Peripherally calcified left breast implant partially included. Hepatobiliary: Slight capsular nodularity raises concern for cirrhosis. No discrete focal lesion. Postcholecystectomy with grossly unchanged biliary prominence allowing for arterial phase technique. Pancreas: Parenchymal atrophy. No ductal dilatation or inflammation. Spleen: Normal in size without focal abnormality. Adrenals/Urinary Tract: Mild left adrenal thickening. Normal right adrenal gland. Excreted IV contrast in both kidneys and urinary bladder from angiogram earlier this day. No hydronephrosis or perinephric edema. Bilateral renal cysts. Stomach/Bowel: Small hiatal hernia. Stomach is nondistended. Presence of intra-abdominal ascites limits bowel evaluation. There is colonic wall thickening involving the ascending colon, hepatic flexure, unlikely proximal transverse colon. No definite small bowel inflammation. Colonic diverticulosis throughout the colon. Lymphatic: No definite adenopathy. No evidence of retroperitoneal hemorrhage. Reproductive: Uterus obscured by right hip arthroplasty. Other: Development of moderate volume abdominopelvic ascites. Fluid may be minimally  complex, however does not represent values concerning for hemorrhage. There is mild mesenteric edema. No free air. Small supraumbilical hernia contains fat and small amount of free fluid. Musculoskeletal: Postsurgical change in the lumbar spine, unchanged from prior. Right hip arthroplasty. IMPRESSION: VASCULAR 1. Placement of superior mesenteric artery stent which is widely patent. No evidence of dissection or postprocedural pseudoaneurysm/hematoma. 2. Diffuse aortic and branch atherosclerosis. NON-VASCULAR 1. Interval development of moderate abdominopelvic ascites from prior exam. Fluid is minimally complex but does not represent hemorrhage. 2. Colonic wall thickening involving the cecum, ascending, and proximal transverse colon. This is new since prior CT. Exact etiology is uncertain. Infectious or inflammatory etiologies are considered, colonic wall edema related to reperfusion could have a similar appearance. No increased density to suggest bowel wall hemorrhage. 3. Colonic diverticulosis without specific findings to suggest diverticulitis. 4. Question of nodular hepatic contours raises concern for cirrhosis. Recommend correlation with cirrhosis risk factors. Electronically Signed   By: Jeb Levering M.D.   On: 03/09/2018 03:18   Ir Paracentesis  Result Date: 03/10/2018 INDICATION: Recent superior mesenteric artery stent placement. New ascites. Request for diagnostic paracentesis. EXAM: ULTRASOUND GUIDED RIGHT LATERAL ABDOMEN PARACENTESIS MEDICATIONS: 2% lidocaine = 10 mL COMPLICATIONS: None immediate. PROCEDURE: Informed written consent was obtained from the patient after a discussion of the risks, benefits and  alternatives to treatment. A timeout was performed prior to the initiation of the procedure. Initial ultrasound scanning demonstrates a small amount of ascites within the right lateral abdomen. The right lateral abdomen was prepped and draped in the usual sterile fashion. 1% lidocaine with  epinephrine was used for local anesthesia. Following this, a 19 gauge, 7-cm, Yueh catheter was introduced. An ultrasound image was saved for documentation purposes. The paracentesis was performed. The catheter was removed and a dressing was applied. The patient tolerated the procedure well without immediate post procedural complication. FINDINGS: A total of approximately 100 ml of clear red fluid was removed. Samples were sent to the laboratory as requested by the clinical team. IMPRESSION: Successful ultrasound-guided paracentesis yielding 160mL of peritoneal fluid. Read by: Gareth Eagle, PA-C Electronically Signed   By: Lucrezia Europe M.D.   On: 03/10/2018 11:13    Lab Data:  CBC: Recent Labs  Lab 03/16/18 1600 03/16/18 2208 03/17/18 0430 03/17/18 0757 03/18/18 0318 03/19/18 0300 03/20/18 0332 03/21/18 0324  WBC 18.9* 17.4* 15.9* 16.6* 15.6* 15.3* 12.6* 11.7*  NEUTROABS 15.3* 14.1* 13.0* 13.6* 12.9*  --   --   --   HGB 8.3* 7.9* 7.7* 7.8* 7.9* 8.2* 8.4* 8.2*  HCT 24.5* 23.9* 23.2* 23.6* 24.2* 26.0* 26.7* 26.3*  MCV 86.0 86.6 87.9 87.7 89.3 90.9 92.7 93.6  PLT 129* 121* 125* 119* 120* 134* 145* 324   Basic Metabolic Panel: Recent Labs  Lab 03/17/18 0430 03/18/18 0318 03/18/18 1032 03/19/18 0300 03/20/18 0714 03/21/18 0324  NA 136  --  138 136 139 138  K 3.7  --  3.7 3.7 3.7 3.7  CL 107  --  108 108 107 107  CO2 22  --  22 20* 24 25  GLUCOSE 133*  --  116* 107* 100* 126*  BUN 51*  --  41* 35* 25* 21*  CREATININE 2.15*  --  1.35* 0.99 0.74 0.72  CALCIUM 7.6*  --  7.8* 7.9* 8.0* 8.1*  MG 2.2 2.3  --  2.3 1.8 1.6*  PHOS 4.0 3.3  --  2.9 3.1 2.7   GFR: Estimated Creatinine Clearance: 71.2 mL/min (by C-G formula based on SCr of 0.72 mg/dL). Liver Function Tests: No results for input(s): AST, ALT, ALKPHOS, BILITOT, PROT, ALBUMIN in the last 168 hours. Recent Labs  Lab 03/16/18 1132  LIPASE 18   No results for input(s): AMMONIA in the last 168 hours. Coagulation  Profile: Recent Labs  Lab 03/15/18 1328  INR 1.49   Cardiac Enzymes: No results for input(s): CKTOTAL, CKMB, CKMBINDEX, TROPONINI in the last 168 hours. BNP (last 3 results) Recent Labs    08/17/17 1038 09/14/17 0921  PROBNP 612 503   HbA1C: No results for input(s): HGBA1C in the last 72 hours. CBG: No results for input(s): GLUCAP in the last 168 hours. Lipid Profile: No results for input(s): CHOL, HDL, LDLCALC, TRIG, CHOLHDL, LDLDIRECT in the last 72 hours. Thyroid Function Tests: No results for input(s): TSH, T4TOTAL, FREET4, T3FREE, THYROIDAB in the last 72 hours. Anemia Panel: No results for input(s): VITAMINB12, FOLATE, FERRITIN, TIBC, IRON, RETICCTPCT in the last 72 hours. Urine analysis:    Component Value Date/Time   COLORURINE AMBER (A) 03/15/2018 1108   APPEARANCEUR CLOUDY (A) 03/15/2018 1108   LABSPEC 1.020 03/15/2018 1108   PHURINE 5.0 03/15/2018 1108   GLUCOSEU NEGATIVE 03/15/2018 1108   HGBUR LARGE (A) 03/15/2018 1108   BILIRUBINUR NEGATIVE 03/15/2018 1108   KETONESUR NEGATIVE 03/15/2018 1108   PROTEINUR 100 (  A) 03/15/2018 1108   UROBILINOGEN 1.0 03/30/2011 0901   NITRITE NEGATIVE 03/15/2018 1108   LEUKOCYTESUR SMALL (A) 03/15/2018 1108     Leila Schuff M.D. [facility] 03/21/2018, 12:10 PM  Pager: [phone] Between 7am to 7pm - call Pager - [phone]  After 7pm go to [email or web address] - password [email or web address]  Call night coverage person covering after 7pm

## 2018-03-22 LAB — CBC
HCT: 27.5 % — ABNORMAL LOW (ref 36.0–46.0)
Hemoglobin: 8.4 g/dL — ABNORMAL LOW (ref 12.0–15.0)
MCH: 28.7 pg (ref 26.0–34.0)
MCHC: 30.5 g/dL (ref 30.0–36.0)
MCV: 93.9 fL (ref 78.0–100.0)
Platelets: 190 10*3/uL (ref 150–400)
RBC: 2.93 MIL/uL — ABNORMAL LOW (ref 3.87–5.11)
RDW: 18.4 % — ABNORMAL HIGH (ref 11.5–15.5)
WBC: 12.7 10*3/uL — ABNORMAL HIGH (ref 4.0–10.5)

## 2018-03-22 LAB — BASIC METABOLIC PANEL
Anion gap: 7 (ref 5–15)
BUN: 18 mg/dL (ref 6–20)
CO2: 25 mmol/L (ref 22–32)
Calcium: 8.3 mg/dL — ABNORMAL LOW (ref 8.9–10.3)
Chloride: 106 mmol/L (ref 101–111)
Creatinine, Ser: 0.69 mg/dL (ref 0.44–1.00)
GFR calc Af Amer: >60 mL/min (ref >60)
GFR calc non Af Amer: >60 mL/min (ref >60)
Glucose, Bld: 119 mg/dL — ABNORMAL HIGH (ref 65–99)
Potassium: 4 mmol/L (ref 3.5–5.1)
Sodium: 138 mmol/L (ref 135–145)

## 2018-03-22 LAB — MAGNESIUM: Magnesium: 1.8 mg/dL (ref 1.7–2.4)

## 2018-03-22 MED ORDER — FUROSEMIDE 10 MG/ML IJ SOLN
40.0000 mg | Freq: Once | INTRAMUSCULAR | Status: AC
  Administered 2018-03-22: 40 mg via INTRAVENOUS
  Filled 2018-03-22: qty 4

## 2018-03-22 NOTE — Progress Notes (Signed)
[facility]                                                                              Patient Demographics  [name], is a 77 y.o. female, DOB - 06/02/1941, [place] date - 02/28/2018   Admitting Physician Etta Quill, DO  Outpatient Primary MD for the patient is [name], MD  Outpatient specialists:   LOS - 21  days   Medical records reviewed and are as summarized below:    Chief Complaint  Patient presents with  . Nausea       Brief summary   77 y/o female with CAD, COPD, prior PE, history of small bowel AVMs and had been off anticoagulants and since February 2018 was admitted on 3/10 with nausea, weakness and dyspnea on exertion, acute respiratory failure with hypoxia.  Workup showed multiple subacute pulmonary embolus on CT scan as well as large acute DVT in the left femoral vein.  She was incidentally found to have severe stenosis of superior mesenteric artery.  Patient was started on heparin drip, admitted to hospitalist service, had stenting of SMA on 3/18.  Since the stent placement, she had worsening abdominal pain, nausea and vomiting.  CTA abdomen and pelvis on 3/19 revealed patent SMA stent with moderate abdominopelvic ascites, colonic wall thickening.  GI was consulted, symptoms thought to be from ischemic colitis.  However hemoglobin continued to drop and on 3/24 AM, patient had a rapid response with hypotension, acute hypoxic respiratory failure, severe abdominal pain.  Repeat CTabdomen pelvis on 3/24 showed a new large anterior abdominal wall hematoma measuring 16.9 x 11.9  X 12.4 cm.  Hemoglobin 6.6.  Heparin drip, Plavix were discontinued, patient was transfused 2 units packed RBC and transferred to ICU.  She was placed on vasopressors per IVC filter placed on 3/24.  Patient's creatinine function continued to trend up, renal ultrasound did not show any obstruction or hydronephrosis, possibly due to hypoperfusion/ATN.  For  the new onset ascites, patient underwent paracentesis on 3/20 which showed SAAG >1.1 Vascular surgery following patient, recommended starting aspirin once hemoglobin has remained stable.   Assessment & Plan    Principal Problem: Right-sided ischemic colitis, superior mesenteric artery stenosis status post stent placement to SMA on 3/18.  -Nausea, vomiting, abdominal pain improved.  Patient tolerating soft solids.  -Vascular surgery following, continue aspirin 81 mg daily -Antibiotics completed 3/27 however low threshold for initiating if fevers or worsening leukocytosis.   Active Problems: Acute hypoxic respiratory failure with pulmonary emboli, left femoral DVT, COPD -Status post IVC filter on 3/24 -Sats 96% on 2 L, wean O2 as tolerated. -I's and O's with +18 L, feels somewhat shallow breathing, gave Lasix 40 mg IV x1, continue oral Lasix daily -Duo nebs as needed  Acute on chronic blood loss anemia, hemorrhagic shock, rectus sheath hematoma, abdominal pain -H&H stable -Hemorrhagic shock resolved, had briefly required vasopressors  Acute kidney injury with non-anion gap metabolic acidosis -Possibly due to hypoperfusion, ATN and contrast.  Baseline creatinine 0.7-1, peaked at 3.2 -Renal ultrasound did not show any obstruction or hydronephrosis -Give extra dose of Lasix today,  follow creatinine closely  New ascites status post paracentesis 3/20 - SAAG >1.1, felt inflammatory, CT scan with cirrhosis, ischemic colitis -GI following  Urinary retention -Patient had an episode of urinary retention during hospitalization, requiring Foley catheter a week ago -Foley catheter discontinued, no acute issues  Hypomagnesemia -Improving  Code Status: Partial DVT Prophylaxis: SCD Family Communication: Discussed in detail with the patient, all imaging results, lab results explained to the patient   Disposition Plan: Skilled nursing facility next 24-48 hours pending bed available Time  Spent in minutes : 25  Procedures:   3/18 SMA stenosis status post stent CT ABD /Pelvis 3/19>Interval development of moderate abdominopelvic ascites,Colonic wall thickening involving the cecum, ascending, proximal transverse colon 3/20 status post paracentesis with 100 cc removed CT abd /pelvis 3/24>>New, large anterior abdominal wall hematoma measuring 16.9 x 11.9x 12.4 cm.2. Small volume ascites with nodular appearance of the liver concerning for cirrhosis.. Medium-sized right pleural effusion 3/24: Retrievable IVC filter placed  Consultants:   PCCM Vascular surgery IR Gastroenterology  Antimicrobials:      Medications  Scheduled Meds: . aspirin EC  81 mg Oral Daily  . chlorhexidine  15 mL Mouth Rinse BID  . cholecalciferol  1,000 Units Oral Daily  . citalopram  30 mg Oral Daily  . docusate sodium  100 mg Oral BID  . feeding supplement (ENSURE ENLIVE)  237 mL Oral TID BM  . ferrous sulfate  325 mg Oral Q breakfast  . furosemide  40 mg Oral Daily  . ipratropium-albuterol  3 mL Inhalation BID  . lidocaine  1 patch Transdermal Q24H  . mouth rinse  15 mL Mouth Rinse q12n4p  . pantoprazole  40 mg Oral Daily  . pilocarpine  1 drop Both Eyes BID  . polyethylene glycol  17 g Oral Daily  . pramipexole  0.25 mg Oral QHS  . senna  1 tablet Oral BID  . sodium chloride flush  3 mL Intravenous Q12H  . sodium chloride flush  3 mL Intravenous Q12H   Continuous Infusions: . sodium chloride     PRN Meds:.acetaminophen **OR** acetaminophen, HYDROmorphone (DILAUDID) injection, ipratropium-albuterol, metoCLOPramide (REGLAN) injection, oxyCODONE, prochlorperazine, sodium chloride flush, sodium chloride flush, traZODone   Antibiotics   Anti-infectives (From admission, onward)   Start     Dose/Rate Route Frequency Ordered Stop   03/16/18 0600  ciprofloxacin (CIPRO) IVPB 200 mg  Status:  Discontinued     200 mg 100 mL/hr over 60 Minutes Intravenous Every 24 hours 03/15/18 0933  03/17/18 1223   03/15/18 0600  ciprofloxacin (CIPRO) IVPB 400 mg  Status:  Discontinued     400 mg 200 mL/hr over 60 Minutes Intravenous Every 24 hours 03/14/18 1259 03/15/18 0933   03/13/18 1800  ciprofloxacin (CIPRO) IVPB 400 mg  Status:  Discontinued     400 mg 200 mL/hr over 60 Minutes Intravenous Every 12 hours 03/13/18 1711 03/14/18 1259   03/13/18 1800  metroNIDAZOLE (FLAGYL) IVPB 500 mg  Status:  Discontinued     500 mg 100 mL/hr over 60 Minutes Intravenous Every 8 hours 03/13/18 1711 03/16/18 1100   03/10/18 1230  metroNIDAZOLE (FLAGYL) IVPB 500 mg  Status:  Discontinued     500 mg 100 mL/hr over 60 Minutes Intravenous Every 8 hours 03/10/18 1208 03/10/18 1815   03/10/18 1230  ciprofloxacin (CIPRO) IVPB 400 mg  Status:  Discontinued     400 mg 200 mL/hr over 60 Minutes Intravenous Every 12 hours 03/10/18 1208 03/10/18 1815  Subjective:   Lean Jaeger was seen and examined today.  Feels better overall however does complain of shallow breathing.  No fevers or chills.  Still has significant peripheral edema.  Abdomen still sore  No chest pain, fevers or chills.  Denies any new weakness, numbness or tingling.    Objective:   Vitals:   03/21/18 1502 03/21/18 2107 03/22/18 0427 03/22/18 0822  BP: 119/71 (!) 112/44 (!) 119/46   Pulse: 88 97 89   Resp: 18 19 15    Temp:  98.8 F (37.1 C) 98.6 F (37 C)   TempSrc: Oral Oral Oral   SpO2: 95% 97% 97% 96%  Weight:   99.3 kg (218 lb 14.7 oz)   Height:        Intake/Output Summary (Last 24 hours) at 03/22/2018 1326 Last data filed at 03/22/2018 1100 Gross per 24 hour  Intake 697.13 ml  Output 1050 ml  Net -352.87 ml     Wt Readings from Last 3 Encounters:  03/22/18 99.3 kg (218 lb 14.7 oz)  11/19/17 84.1 kg (185 lb 6.4 oz)  09/14/17 78.6 kg (173 lb 3.2 oz)     Exam    General: Alert and oriented x 3, NAD  Eyes:   HEENT:   Cardiovascular: S1 S2 auscultated, Regular rate and rhythm. 2-3+ eating  edema  Respiratory: Decreased breath sound at the bases  Gastrointestinal: Soft, nontender, nondistended, + bowel sounds  Ext: 2-3+ pedal edema bilaterally  Neuro: no new deficit  Musculoskeletal: No digital cyanosis, clubbing  Skin: No rashes  Psych: Normal affect and demeanor, alert and oriented x3   Data Reviewed:  I have personally reviewed following labs and imaging studies  Micro Results Recent Results (from the past 240 hour(s))  Culture, Urine     Status: Abnormal   Collection Time: 03/13/18  4:42 PM  Result Value Ref Range Status   Specimen Description URINE, CATHETERIZED  Final   Special Requests NONE  Final   Culture (A)  Final    <10,000 COLONIES/mL INSIGNIFICANT GROWTH Performed at [facility], [address], [place], [place] [address]    Report Status 03/14/2018 FINAL  Final  Culture, blood (Routine X 2) w Reflex to ID Panel     Status: None   Collection Time: 03/14/18 11:15 AM  Result Value Ref Range Status   Specimen Description BLOOD LEFT HAND  Final   Special Requests   Final    BOTTLES DRAWN AEROBIC ONLY Blood Culture adequate volume   Culture   Final    NO GROWTH 5 DAYS Performed at [facility], [place] [address], [place], [place] [address]    Report Status 03/19/2018 FINAL  Final  Culture, blood (Routine X 2) w Reflex to ID Panel     Status: None   Collection Time: 03/14/18 11:17 AM  Result Value Ref Range Status   Specimen Description BLOOD LEFT HAND  Final   Special Requests   Final    BOTTLES DRAWN AEROBIC ONLY Blood Culture results may not be optimal due to an inadequate volume of blood received in culture bottles   Culture   Final    NO GROWTH 5 DAYS Performed at [facility], [place] [address], [place], [place] [address]    Report Status 03/19/2018 FINAL  Final    Radiology Reports Ct Abdomen Pelvis Wo Contrast  Addendum Date: 03/14/2018   ADDENDUM REPORT: 03/14/2018 04:14 ADDENDUM: These results were called by  telephone at  the time of interpretation on 03/14/2018 at 4:04 am to [facility] , who verbally acknowledged these results. Electronically Signed   By: Ulyses Jarred M.D.   On: 03/14/2018 04:14   Result Date: 03/14/2018 CLINICAL DATA:  Abdominal pain and fever EXAM: CT ABDOMEN AND PELVIS WITHOUT CONTRAST TECHNIQUE: Multidetector CT imaging of the abdomen and pelvis was performed following the standard protocol without IV contrast. COMPARISON:  CT abdomen pelvis 03/09/2018 FINDINGS: Examination is degraded by streak artifact from external patient devices and from the arms down positioning of the patient, creating multiple streak artifacts and decreasing sensitivity and specificity of this scan. Lower chest: Right pleural effusion and associated atelectasis. Hepatobiliary: Right upper quadrant ascites. Nodular appearance of the liver. No focal liver lesion. Status post cholecystectomy. Pancreas: Atrophic appearance of the pancreas. Spleen: Normal. Adrenals/Urinary Tract: --Adrenal glands: Normal. --Right kidney/ureter: No hydronephrosis, perinephric stranding or nephrolithiasis. No obstructing ureteral stones. --Left kidney/ureter: No hydronephrosis, perinephric stranding or nephrolithiasis. No obstructing ureteral stones. --Urinary bladder: Normal appearance for the degree of distention. Stomach/Bowel: --Stomach/Duodenum: No hiatal hernia or other gastric abnormality. Normal duodenal course. --Small bowel: No dilatation or inflammation. --Colon: Diffuse colonic diverticulosis without acute inflammation. --Appendix: Normal. Vascular/Lymphatic: Atherosclerotic calcification is present within the non-aneurysmal abdominal aorta, without hemodynamically significant stenosis. No abdominal or pelvic lymphadenopathy. Reproductive: Pelvis largely obscured by streak artifact. Musculoskeletal. Right total hip arthroplasty. L1-L3 posterior spinal fusion. Other: Large anterior abdominal wall hematoma measuring up to 16.9 x 11.9  x 12.4 cm. This is new compared to the recent study from 03/09/2018. This exerts mass effect on the intra-abdominal contents. IMPRESSION: 1. New, large anterior abdominal wall hematoma measuring 16.9 x 11.9 x 12.4 cm. 2. Small volume ascites with nodular appearance of the liver concerning for cirrhosis. 3. Medium-sized right pleural effusion with associated atelectasis. 4. Diffuse colonic diverticulosis without acute inflammation. 5.  Aortic Atherosclerosis (ICD10-I70.0). Electronically Signed: By: Ulyses Jarred M.D. On: 03/14/2018 03:45   Ct Angio Chest Pe W/cm &/or Wo Cm  Result Date: 03/01/2018 CLINICAL DATA:  Subacute onset of nausea. Generalized abdominal pain. Mild generalized chest pain and shortness of breath. EXAM: CT ANGIOGRAPHY CHEST CT ABDOMEN AND PELVIS WITH CONTRAST TECHNIQUE: Multidetector CT imaging of the chest was performed using the standard protocol during bolus administration of intravenous contrast. Multiplanar CT image reconstructions and MIPs were obtained to evaluate the vascular anatomy. Multidetector CT imaging of the abdomen and pelvis was performed using the standard protocol during bolus administration of intravenous contrast. CONTRAST:  80 mL of Isovue 370 IV contrast COMPARISON:  CT of the abdomen and pelvis performed 11/08/2017, and CT of the thoracic spine performed 07/26/2014 FINDINGS: CTA CHEST FINDINGS Cardiovascular: There appears to be subacute or chronic pulmonary embolus within the pulmonary artery to the left lower lobe, extending minimally into segmental branches. No additional pulmonary embolus is identified. The RV/LV ratio of 1.1 may reflect right heart strain. Diffuse coronary artery calcifications are seen. Diffuse calcification is noted along the aortic arch and proximal great vessels. The heart remains normal in size. The patient is status post median sternotomy. There is a chronic fracture of the inferior-most sternal wire. Mediastinum/Nodes: The mediastinum is  otherwise unremarkable in appearance. No mediastinal lymphadenopathy is seen. No pericardial effusion is identified. The thyroid gland is grossly unremarkable. No axillary lymphadenopathy is seen. Lungs/Pleura: Minimal bilateral atelectasis is noted. The lungs are otherwise clear. Mild scarring is noted at the lung apices. No significant focal consolidation, pleural effusion or pneumothorax is seen. No masses are  identified. Musculoskeletal: No acute osseous abnormalities are identified. The patient's right shoulder arthroplasty is incompletely imaged but appears grossly unremarkable. The visualized musculature is unremarkable in appearance. Diffuse calcification is noted about the patient's bilateral breast implants. Review of the MIP images confirms the above findings. CT ABDOMEN and PELVIS FINDINGS Hepatobiliary: The liver is unremarkable in appearance. The patient is status post cholecystectomy, with clips noted at the gallbladder fossa. There is corresponding distention of the intrahepatic biliary ducts and common bile duct. Pancreas: The pancreas is atrophic and grossly unremarkable in appearance. Spleen: The spleen is unremarkable in appearance. Adrenals/Urinary Tract: Minimal calcification is noted at the left adrenal gland, likely adjacent to a small underlying adrenal lesion. The right adrenal gland is unremarkable. A right renal cyst is again noted, better characterized on the prior study. The kidneys are otherwise unremarkable. There is no evidence of hydronephrosis. No perinephric stranding is seen. Stomach/Bowel: The stomach is unremarkable in appearance. The small bowel is within normal limits. The appendix is normal in caliber, without evidence of appendicitis. The colon is unremarkable in appearance. Vascular/Lymphatic: Diffuse calcification is seen along the abdominal aorta and its branches, including along the right renal artery and superior mesenteric artery. There is likely relatively severe  luminal narrowing at the proximal superior mesenteric artery. The abdominal aorta is otherwise grossly unremarkable. The inferior vena cava is grossly unremarkable. No retroperitoneal lymphadenopathy is seen. No pelvic sidewall lymphadenopathy is identified. Reproductive: The bladder is mildly distended and grossly unremarkable. The uterus is unremarkable in appearance. The ovaries are grossly symmetric. No suspicious adnexal masses are seen. Other: No additional soft tissue abnormalities are seen. Musculoskeletal: No acute osseous abnormalities are identified. The patient's right hip arthroplasty is incompletely imaged, but appears grossly unremarkable. The patient is status post lumbar spinal fusion at L1-L3, with underlying decompression from L1-L4. The visualized musculature is unremarkable in appearance. Review of the MIP images confirms the above findings. IMPRESSION: 1. Apparent subacute or chronic pulmonary embolus within the pulmonary artery to the left lower lung lobe, extending minimally into segmental branches. RV/LV ratio of 1.1 may reflect right heart strain. Would correlate with the patient's symptoms. 2. Minimal bilateral atelectasis noted. Mild scarring at the lung apices. 3. Diffuse coronary artery calcifications seen. 4. Diffuse aortic atherosclerosis, with diffuse calcification along the right renal artery and superior mesenteric artery. Likely relatively severe luminal narrowing at the proximal superior mesenteric artery. Would correlate for any associated symptoms. Critical Value/emergent results were called by telephone at the time of interpretation on 03/01/2018 at 1:00 am to the ER clinical team, who verbally acknowledged these results. Electronically Signed   By: Garald Balding M.D.   On: 03/01/2018 01:03   Ct Abdomen Pelvis W Contrast  Result Date: 03/01/2018 CLINICAL DATA:  Subacute onset of nausea. Generalized abdominal pain. Mild generalized chest pain and shortness of breath. EXAM:  CT ANGIOGRAPHY CHEST CT ABDOMEN AND PELVIS WITH CONTRAST TECHNIQUE: Multidetector CT imaging of the chest was performed using the standard protocol during bolus administration of intravenous contrast. Multiplanar CT image reconstructions and MIPs were obtained to evaluate the vascular anatomy. Multidetector CT imaging of the abdomen and pelvis was performed using the standard protocol during bolus administration of intravenous contrast. CONTRAST:  80 mL of Isovue 370 IV contrast COMPARISON:  CT of the abdomen and pelvis performed 11/08/2017, and CT of the thoracic spine performed 07/26/2014 FINDINGS: CTA CHEST FINDINGS Cardiovascular: There appears to be subacute or chronic pulmonary embolus within the pulmonary artery to the left lower  lobe, extending minimally into segmental branches. No additional pulmonary embolus is identified. The RV/LV ratio of 1.1 may reflect right heart strain. Diffuse coronary artery calcifications are seen. Diffuse calcification is noted along the aortic arch and proximal great vessels. The heart remains normal in size. The patient is status post median sternotomy. There is a chronic fracture of the inferior-most sternal wire. Mediastinum/Nodes: The mediastinum is otherwise unremarkable in appearance. No mediastinal lymphadenopathy is seen. No pericardial effusion is identified. The thyroid gland is grossly unremarkable. No axillary lymphadenopathy is seen. Lungs/Pleura: Minimal bilateral atelectasis is noted. The lungs are otherwise clear. Mild scarring is noted at the lung apices. No significant focal consolidation, pleural effusion or pneumothorax is seen. No masses are identified. Musculoskeletal: No acute osseous abnormalities are identified. The patient's right shoulder arthroplasty is incompletely imaged but appears grossly unremarkable. The visualized musculature is unremarkable in appearance. Diffuse calcification is noted about the patient's bilateral breast implants. Review of  the MIP images confirms the above findings. CT ABDOMEN and PELVIS FINDINGS Hepatobiliary: The liver is unremarkable in appearance. The patient is status post cholecystectomy, with clips noted at the gallbladder fossa. There is corresponding distention of the intrahepatic biliary ducts and common bile duct. Pancreas: The pancreas is atrophic and grossly unremarkable in appearance. Spleen: The spleen is unremarkable in appearance. Adrenals/Urinary Tract: Minimal calcification is noted at the left adrenal gland, likely adjacent to a small underlying adrenal lesion. The right adrenal gland is unremarkable. A right renal cyst is again noted, better characterized on the prior study. The kidneys are otherwise unremarkable. There is no evidence of hydronephrosis. No perinephric stranding is seen. Stomach/Bowel: The stomach is unremarkable in appearance. The small bowel is within normal limits. The appendix is normal in caliber, without evidence of appendicitis. The colon is unremarkable in appearance. Vascular/Lymphatic: Diffuse calcification is seen along the abdominal aorta and its branches, including along the right renal artery and superior mesenteric artery. There is likely relatively severe luminal narrowing at the proximal superior mesenteric artery. The abdominal aorta is otherwise grossly unremarkable. The inferior vena cava is grossly unremarkable. No retroperitoneal lymphadenopathy is seen. No pelvic sidewall lymphadenopathy is identified. Reproductive: The bladder is mildly distended and grossly unremarkable. The uterus is unremarkable in appearance. The ovaries are grossly symmetric. No suspicious adnexal masses are seen. Other: No additional soft tissue abnormalities are seen. Musculoskeletal: No acute osseous abnormalities are identified. The patient's right hip arthroplasty is incompletely imaged, but appears grossly unremarkable. The patient is status post lumbar spinal fusion at L1-L3, with underlying  decompression from L1-L4. The visualized musculature is unremarkable in appearance. Review of the MIP images confirms the above findings. IMPRESSION: 1. Apparent subacute or chronic pulmonary embolus within the pulmonary artery to the left lower lung lobe, extending minimally into segmental branches. RV/LV ratio of 1.1 may reflect right heart strain. Would correlate with the patient's symptoms. 2. Minimal bilateral atelectasis noted. Mild scarring at the lung apices. 3. Diffuse coronary artery calcifications seen. 4. Diffuse aortic atherosclerosis, with diffuse calcification along the right renal artery and superior mesenteric artery. Likely relatively severe luminal narrowing at the proximal superior mesenteric artery. Would correlate for any associated symptoms. Critical Value/emergent results were called by telephone at the time of interpretation on 03/01/2018 at 1:00 am to the ER clinical team, who verbally acknowledged these results. Electronically Signed   By: Garald Balding M.D.   On: 03/01/2018 01:03   Ir Ivc Filter Plmt / S&i /img Guid/mod Sed  Result Date: 03/14/2018 INDICATION:  77 year old with history of pulmonary embolism and left lower extremity DVT. Patient now has a large abdominal wall hematoma and no longer candidate for anticoagulation. EXAM: IVC FILTER PLACEMENT; IVC VENOGRAM; ULTRASOUND FOR VASCULAR ACCESS Physician: Stephan Minister. Anselm Pancoast, MD MEDICATIONS: None. ANESTHESIA/SEDATION: Fentanyl 25 mcg IV; Versed 0.5 mg IV Moderate Sedation Time:  22 minutes The patient was continuously monitored during the procedure by the interventional radiology nurse under my direct supervision. CONTRAST:  Carbon dioxide FLUOROSCOPY TIME:  Fluoroscopy Time: 7 minutes 6 seconds (327 mGy). COMPLICATIONS: None immediate. PROCEDURE: Informed consent was obtained for an IVC venogram and filter placement. Ultrasound demonstrated a patent right internal jugular vein. Ultrasound images were obtained for documentation. The  right side of the neck was prepped and draped in a sterile fashion. Maximal barrier sterile technique was utilized including caps, mask, sterile gowns, sterile gloves, sterile drape, hand hygiene and skin antiseptic. The skin was anesthetized with 1% lidocaine. A 21 gauge needle was directed into the vein with ultrasound guidance and a micropuncture dilator set was placed. A wire was advanced into the IVC. The filter sheath was advanced over the wire into the IVC. An IVC venogram was performed with carbon dioxide. Limited evaluation of the IVC due to carbon dioxide and the lumbar surgical hardware. Therefore, a MPA catheter was used to cannulate bilateral renal veins. Renal venography was also performed with carbon dioxide bilaterally. The location of the renal veins were confidently identified. Fluoroscopic images were obtained for documentation. A Bard Denali filter was deployed in the IVC below the lowest renal vein. Follow-up images were obtained to confirm adequate deployment of the filter. Vascular sheath was removed with manual compression. FINDINGS: IVC was patent. Bilateral renal veins were identified. The filter was deployed below the lowest renal vein. IMPRESSION: Successful placement of a retrievable IVC filter. PLAN: This IVC filter is potentially retrievable. The patient will be assessed for filter retrieval by Interventional Radiology in approximately 8-12 weeks. Further recommendations regarding filter retrieval, continued surveillance or declaration of device permanence, will be made at that time. Electronically Signed   By: Markus Daft M.D.   On: 03/14/2018 13:03   US Renal  Result Date: 03/15/2018 CLINICAL DATA:  Acute kidney injury. EXAM: RENAL / URINARY TRACT ULTRASOUND COMPLETE COMPARISON:  CT, 03/14/2018. FINDINGS: Right Kidney: Length: 8.6 cm. Diffuse cortical thinning. Increased renal parenchymal echogenicity. There is a 2.6 x 2.0 x 2.3 cm cyst arising from the lower pole. No other  masses, no stones and no hydronephrosis. Left Kidney: Length: 9.2 cm. Mild diffuse cortical thinning. Borderline increased renal parenchymal echogenicity. No mass, stone or hydronephrosis. Lower pole partly obscured by overlying bowel gas. Bladder: Decompressed with a Foley catheter.  Not well assessed. Patient also has ascites and a right pleural effusion. IMPRESSION: 1. Findings consistent with medical renal disease with renal cortical thinning, greater on the right, and increased renal parenchymal echogenicity on the right and borderline increased renal parenchymal echogenicity on the left. 2. No acute findings.  No hydronephrosis. 3. 2.6 cm right lower pole renal cyst. Electronically Signed   By: Lajean Manes M.D.   On: 03/15/2018 15:45   Dg Chest Port 1 View  Result Date: 03/18/2018 CLINICAL DATA:  Dyspnea, bilateral leg and arm swelling, abdominal pain and nausea for the past 3-4 days. History of COPD, CHF, and previous CABG. EXAM: PORTABLE CHEST 1 VIEW COMPARISON:  Portable chest x-ray of March 15, 2018 FINDINGS: The lungs are well-expanded. There is no focal infiltrate. There is no pleural  effusion. The cardiac silhouette is enlarged. The pulmonary vascularity is not engorged. There are post CABG changes. There is calcification in the wall of the thoracic aorta. There are bilateral breast implants exhibiting capsular calcification. IMPRESSION: COPD. Mild cardiomegaly. Previous CABG. No pulmonary edema, pneumonia, or pleural effusion. Thoracic aortic atherosclerosis. Electronically Signed   By: David  Martinique M.D.   On: 03/18/2018 09:15   Dg Chest Port 1 View  Result Date: 03/15/2018 CLINICAL DATA:  Pleural effusion. EXAM: PORTABLE CHEST 1 VIEW COMPARISON:  Radiograph of March 13, 2018. FINDINGS: The heart size and mediastinal contours are within normal limits. Atherosclerosis of thoracic aorta is noted. Status post coronary artery bypass graft. No pneumothorax is noted. Increased right basilar  opacity is noted concerning for worsening atelectasis or infiltrate with associated pleural effusion. Status post right shoulder arthroplasty. IMPRESSION: Increased right basilar atelectasis or infiltrate is noted with possible associated pleural effusion. Aortic Atherosclerosis (ICD10-I70.0). Electronically Signed   By: Marijo Conception, M.D.   On: 03/15/2018 07:45   Dg Chest Port 1 View  Result Date: 03/13/2018 CLINICAL DATA:  77 year old with shortness of breath. EXAM: PORTABLE CHEST 1 VIEW COMPARISON:  Chest CT 02/28/2018 and chest radiograph 11/08/2017 FINDINGS: Bilateral breast implants. No focal airspace disease or pulmonary edema. Right shoulder replacement. Atherosclerotic calcifications at the aortic arch. Prior CABG procedure. Sclerosis and degenerative changes at the left shoulder. Surgical hardware in the lumbar spine. Heart size is within normal limits. IMPRESSION: No acute chest findings. Electronically Signed   By: Markus Daft M.D.   On: 03/13/2018 12:52   Dg Abd 2 Views  Result Date: 03/12/2018 CLINICAL DATA:  Lower abdominal pain beginning this morning post vascular surgery EXAM: ABDOMEN - 2 VIEW COMPARISON:  03/08/2018 FINDINGS: Normal bowel gas pattern. No bowel dilatation or bowel wall thickening. Gas present in rectum. Bones demineralized with evidence of prior lumbar fusion and prior RIGHT hip replacement. Scattered degenerative changes of lower thoracic spine. Surgical clips RIGHT upper quadrant question cholecystectomy. Degenerative changes LEFT hip joint. Question vascular calcification versus 8 mm diameter calculus at upper pole LEFT kidney. IMPRESSION: No acute abnormalities. Electronically Signed   By: Lavonia Dana M.D.   On: 03/12/2018 19:28   Dg Abd Portable 1v  Result Date: 03/16/2018 CLINICAL DATA:  Abdominal pain EXAM: PORTABLE ABDOMEN - 1 VIEW COMPARISON:  None. FINDINGS: There is no bowel dilatation or air-fluid level to suggest bowel obstruction. No free air. There is a  total hip replacement on the right as well as postoperative change in the lumbar region. There is a filter in the inferior vena cava. There are calcified breast implants bilaterally. IMPRESSION: No demonstrable bowel obstruction or free air. Electronically Signed   By: Lowella Grip III M.D.   On: 03/16/2018 11:37   Dg Abd Portable 1v  Result Date: 03/08/2018 CLINICAL DATA:  Nausea and vomiting EXAM: PORTABLE ABDOMEN - 1 VIEW COMPARISON:  CT 02/28/2018 FINDINGS: Surgical hardware in the upper lumbar spine. Surgical clips in the right upper quadrant. Nonobstructed bowel-gas pattern. Radiopaque material in the bladder. Probable diverticulum on the right side of the bladder. Status post right hip replacement with fixating screw paralleling the cortex of the right inter pelvis. IMPRESSION: 1. Nonobstructed gas pattern 2. Contrast material within the bladder Electronically Signed   By: Donavan Foil M.D.   On: 03/08/2018 22:30   Ct Angio Abd/pel W/ And/or W/o  Result Date: 03/09/2018 CLINICAL DATA:  Upper abdominal pain and nausea after mesenteric stent placement today. EXAM:  CTA ABDOMEN AND PELVIS wITHOUT AND WITH CONTRAST TECHNIQUE: Multidetector CT imaging of the abdomen and pelvis was performed using the standard protocol during bolus administration of intravenous contrast. Multiplanar reconstructed images and MIPs were obtained and reviewed to evaluate the vascular anatomy. CONTRAST:  100 cc Isovue 370 IV COMPARISON:  Abdomen/pelvis CT 02/28/2018 FINDINGS: VASCULAR Aorta: Moderate atherosclerosis. Normal caliber aorta without aneurysm, dissection, vasculitis or significant stenosis. Celiac: Plaque at the origin causes approximately 50% stenosis. No dissection or vasculitis. Distal branches are patent. SMA: Patent proximal SMA stent with resolved origin stenosis. Calcified distally but patent. No evidence of dissection. No evidence of contrast extravasation. Renals: Calcified plaque at the origins. No  dissection or vasculitis. IMA: Patent without evidence of aneurysm, dissection, vasculitis or significant stenosis. Inflow: Patent without evidence of aneurysm, dissection, vasculitis or significant stenosis. Proximal Outflow: Bilateral common femoral and visualized portions of the superficial and profunda femoral arteries are patent without evidence of aneurysm, dissection, vasculitis or significant stenosis. No evidence of pseudoaneurysm formation or active extravasation at the puncture site. Veins: Not well assessed on arterial phase imaging. There is presumed mixing of opacified and non-opacified blood in the mesenteric venous confluence. Review of the MIP images confirms the above findings. NON-VASCULAR Lower chest: Linear atelectasis in both lower lobes. Trace right pleural effusion. Coronary artery calcifications. Peripherally calcified left breast implant partially included. Hepatobiliary: Slight capsular nodularity raises concern for cirrhosis. No discrete focal lesion. Postcholecystectomy with grossly unchanged biliary prominence allowing for arterial phase technique. Pancreas: Parenchymal atrophy. No ductal dilatation or inflammation. Spleen: Normal in size without focal abnormality. Adrenals/Urinary Tract: Mild left adrenal thickening. Normal right adrenal gland. Excreted IV contrast in both kidneys and urinary bladder from angiogram earlier this day. No hydronephrosis or perinephric edema. Bilateral renal cysts. Stomach/Bowel: Small hiatal hernia. Stomach is nondistended. Presence of intra-abdominal ascites limits bowel evaluation. There is colonic wall thickening involving the ascending colon, hepatic flexure, unlikely proximal transverse colon. No definite small bowel inflammation. Colonic diverticulosis throughout the colon. Lymphatic: No definite adenopathy. No evidence of retroperitoneal hemorrhage. Reproductive: Uterus obscured by right hip arthroplasty. Other: Development of moderate volume  abdominopelvic ascites. Fluid may be minimally complex, however does not represent values concerning for hemorrhage. There is mild mesenteric edema. No free air. Small supraumbilical hernia contains fat and small amount of free fluid. Musculoskeletal: Postsurgical change in the lumbar spine, unchanged from prior. Right hip arthroplasty. IMPRESSION: VASCULAR 1. Placement of superior mesenteric artery stent which is widely patent. No evidence of dissection or postprocedural pseudoaneurysm/hematoma. 2. Diffuse aortic and branch atherosclerosis. NON-VASCULAR 1. Interval development of moderate abdominopelvic ascites from prior exam. Fluid is minimally complex but does not represent hemorrhage. 2. Colonic wall thickening involving the cecum, ascending, and proximal transverse colon. This is new since prior CT. Exact etiology is uncertain. Infectious or inflammatory etiologies are considered, colonic wall edema related to reperfusion could have a similar appearance. No increased density to suggest bowel wall hemorrhage. 3. Colonic diverticulosis without specific findings to suggest diverticulitis. 4. Question of nodular hepatic contours raises concern for cirrhosis. Recommend correlation with cirrhosis risk factors. Electronically Signed   By: Jeb Levering M.D.   On: 03/09/2018 03:18   Ir Paracentesis  Result Date: 03/10/2018 INDICATION: Recent superior mesenteric artery stent placement. New ascites. Request for diagnostic paracentesis. EXAM: ULTRASOUND GUIDED RIGHT LATERAL ABDOMEN PARACENTESIS MEDICATIONS: 2% lidocaine = 10 mL COMPLICATIONS: None immediate. PROCEDURE: Informed written consent was obtained from the patient after a discussion of the risks, benefits and alternatives to  treatment. A timeout was performed prior to the initiation of the procedure. Initial ultrasound scanning demonstrates a small amount of ascites within the right lateral abdomen. The right lateral abdomen was prepped and draped in the  usual sterile fashion. 1% lidocaine with epinephrine was used for local anesthesia. Following this, a 19 gauge, 7-cm, Yueh catheter was introduced. An ultrasound image was saved for documentation purposes. The paracentesis was performed. The catheter was removed and a dressing was applied. The patient tolerated the procedure well without immediate post procedural complication. FINDINGS: A total of approximately 100 ml of clear red fluid was removed. Samples were sent to the laboratory as requested by the clinical team. IMPRESSION: Successful ultrasound-guided paracentesis yielding 187mL of peritoneal fluid. Read by: Gareth Eagle, PA-C Electronically Signed   By: Lucrezia Europe M.D.   On: 03/10/2018 11:13    Lab Data:  CBC: Recent Labs  Lab 03/16/18 1600 03/16/18 2208 03/17/18 0430 03/17/18 0757 03/18/18 0318 03/19/18 0300 03/20/18 0332 03/21/18 0324 03/22/18 0653  WBC 18.9* 17.4* 15.9* 16.6* 15.6* 15.3* 12.6* 11.7* 12.7*  NEUTROABS 15.3* 14.1* 13.0* 13.6* 12.9*  --   --   --   --   HGB 8.3* 7.9* 7.7* 7.8* 7.9* 8.2* 8.4* 8.2* 8.4*  HCT 24.5* 23.9* 23.2* 23.6* 24.2* 26.0* 26.7* 26.3* 27.5*  MCV 86.0 86.6 87.9 87.7 89.3 90.9 92.7 93.6 93.9  PLT 129* 121* 125* 119* 120* 134* 145* 154 366   Basic Metabolic Panel: Recent Labs  Lab 03/17/18 0430 03/18/18 0318 03/18/18 1032 03/19/18 0300 03/20/18 0714 03/21/18 0324 03/22/18 0653  NA 136  --  138 136 139 138 138  K 3.7  --  3.7 3.7 3.7 3.7 4.0  CL 107  --  108 108 107 107 106  CO2 22  --  22 20* 24 25 25   GLUCOSE 133*  --  116* 107* 100* 126* 119*  BUN 51*  --  41* 35* 25* 21* 18  CREATININE 2.15*  --  1.35* 0.99 0.74 0.72 0.69  CALCIUM 7.6*  --  7.8* 7.9* 8.0* 8.1* 8.3*  MG 2.2 2.3  --  2.3 1.8 1.6* 1.8  PHOS 4.0 3.3  --  2.9 3.1 2.7  --    GFR: Estimated Creatinine Clearance: 72.6 mL/min (by C-G formula based on SCr of 0.69 mg/dL). Liver Function Tests: No results for input(s): AST, ALT, ALKPHOS, BILITOT, PROT, ALBUMIN in the last  168 hours. Recent Labs  Lab 03/16/18 1132  LIPASE 18   No results for input(s): AMMONIA in the last 168 hours. Coagulation Profile: Recent Labs  Lab 03/15/18 1328  INR 1.49   Cardiac Enzymes: No results for input(s): CKTOTAL, CKMB, CKMBINDEX, TROPONINI in the last 168 hours. BNP (last 3 results) Recent Labs    08/17/17 1038 09/14/17 0921  PROBNP 612 503   HbA1C: No results for input(s): HGBA1C in the last 72 hours. CBG: No results for input(s): GLUCAP in the last 168 hours. Lipid Profile: No results for input(s): CHOL, HDL, LDLCALC, TRIG, CHOLHDL, LDLDIRECT in the last 72 hours. Thyroid Function Tests: No results for input(s): TSH, T4TOTAL, FREET4, T3FREE, THYROIDAB in the last 72 hours. Anemia Panel: No results for input(s): VITAMINB12, FOLATE, FERRITIN, TIBC, IRON, RETICCTPCT in the last 72 hours. Urine analysis:    Component Value Date/Time   COLORURINE AMBER (A) 03/15/2018 1108   APPEARANCEUR CLOUDY (A) 03/15/2018 1108   LABSPEC 1.020 03/15/2018 1108   PHURINE 5.0 03/15/2018 1108   GLUCOSEU NEGATIVE 03/15/2018 1108  HGBUR LARGE (A) 03/15/2018 1108   BILIRUBINUR NEGATIVE 03/15/2018 1108   KETONESUR NEGATIVE 03/15/2018 1108   PROTEINUR 100 (A) 03/15/2018 1108   UROBILINOGEN 1.0 03/30/2011 0901   NITRITE NEGATIVE 03/15/2018 1108   LEUKOCYTESUR SMALL (A) 03/15/2018 1108     Hugh Kamara M.D. [facility] 03/22/2018, 1:26 PM  Pager: [phone] Between 7am to 7pm - call Pager - [phone]  After 7pm go to [email or web address] - password [email or web address]  Call night coverage person covering after 7pm

## 2018-03-22 NOTE — Progress Notes (Addendum)
4:36 pm CSW did reach [place] in admissions at [facility]. They have a bed available for patient and have started patient's [facility] authorization request. CSW awaiting [employer] determination. Facility will admit patient when auth received.  4:07 pm CSW met with patient and family at bedside and gave SNF bed offers. Patient's first choice is [facility] but they have not offered a bed. CSW left multiple messages for [facility] admissions. Patient's second choice is [facility]. CSW also awaiting call back from [facility] admissions to start [facility] authorization.  Estanislado Emms, [place]

## 2018-03-22 NOTE — Progress Notes (Signed)
Physical Therapy Treatment Patient Details Name: [name] MRN: [id number] DOB: 29-Jun-1941 Today's Date: 03/22/2018    History of Present Illness 77 year old female with past medical history of R THA, B TKA, B TSA, back surgery, neck surgery, CAD, PVD and COPD who is had worsening left hip issues that have gotten to the point where she does not ambulate much over the past month and presented to the emergency room on 3/10 with complaints of nausea, weakness and dyspnea on exertion and found to be in acute respiratory failure with hypoxia. Workup discovered multiplesubacute pulmonary embolus on CT scan. 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  During hospitalization patient is now s/p abdominal angiogram and parcentesis on 3/20.  GI was consulted, symptoms thought to be from ischemic colitis.  However hemoglobin continued to drop and on 3/24 AM, patient had a rapid response with hypotension, acute hypoxic respiratory failure, severe abdominal pain.  Repeat CTabdomen pelvis on 3/24 showed a new large anterior abdominal wall hematoma measuring 16.9 x 11.9  X 12.4 cm.  Hemoglobin 6.6.  Heparin drip, Plavix were discontinued, patient was transfused 2 units packed RBC and transferred to ICU.  IVC filter placed on 3/24.  Patient's creatinine function continued to trend up, renal ultrasound did not show any obstruction or hydronephrosis, possibly due to hypoperfusion/ATN.     PT Comments    Pt received in bed and agreeable to participation in therapy. She reports L hip pain and verbalizes fear that LLE will not support her in stance. Mod assist required for supine to sit, sit to stand and SPT with RW bed to recliner. Pt positioned in recliner with feet elevated at end of session.    Follow Up Recommendations  Supervision/Assistance - 24 hour;SNF     Equipment Recommendations  None recommended by PT    Recommendations for Other Services       Precautions /  Restrictions Precautions Precautions: Fall    Mobility  Bed Mobility Overal bed mobility: Needs Assistance Bed Mobility: Supine to Sit     Supine to sit: Mod assist;HOB elevated     General bed mobility comments: verbal cues for sequencing, increased time, use of bed pad to scoot to EOB  Transfers Overall transfer level: Needs assistance Equipment used: Rolling walker (2 wheeled) Transfers: Sit to/from [facility] Sit to Stand: Mod assist Stand pivot transfers: Mod assist       General transfer comment: verbal cues for hand placement, increased time to power up and stabilize initial standing balance. Pivot transfer toward right with RW. Pt able to take very short pivot steps.  Ambulation/Gait             General Gait Details: unable   Stairs            Wheelchair Mobility    Modified Rankin (Stroke Patients Only)       Balance Overall balance assessment: Needs assistance Sitting-balance support: No upper extremity supported;Feet supported Sitting balance-Leahy Scale: Fair     Standing balance support: During functional activity;Bilateral upper extremity supported Standing balance-Leahy Scale: Poor Standing balance comment: relies on UE support for balance.                              Cognition Arousal/Alertness: Awake/alert Behavior During Therapy: WFL for tasks assessed/performed Overall Cognitive Status: Within Functional Limits for tasks assessed  Exercises      General Comments        Pertinent Vitals/Pain Pain Assessment: Faces Faces Pain Scale: Hurts even more Pain Location: L hip (chronic OA pain per pt) Pain Descriptors / Indicators: Sore;Grimacing;Guarding Pain Intervention(s): Monitored during session;Limited activity within patient's tolerance;Repositioned    Home Living                      Prior Function            PT Goals  (current goals can now be found in the care plan section) Acute Rehab PT Goals Patient Stated Goal: return to independence with mobility PT Goal Formulation: With patient/family Time For Goal Achievement: 04/02/18 Potential to Achieve Goals: Good Progress towards PT goals: Progressing toward goals    Frequency    Min 3X/week      PT Plan Current plan remains appropriate    Co-evaluation              AM-PAC PT "6 Clicks" Daily Activity  Outcome Measure  Difficulty turning over in bed (including adjusting bedclothes, sheets and blankets)?: Unable Difficulty moving from lying on back to sitting on the side of the bed? : Unable Difficulty sitting down on and standing up from a chair with arms (e.g., wheelchair, bedside commode, etc,.)?: A Lot Help needed moving to and from a bed to chair (including a wheelchair)?: A Lot Help needed walking in hospital room?: A Lot Help needed climbing 3-5 steps with a railing? : Total 6 Click Score: 9    End of Session Equipment Utilized During Treatment: Gait belt;Oxygen Activity Tolerance: Patient tolerated treatment well Patient left: with call bell/phone within reach;in chair Nurse Communication: Mobility status PT Visit Diagnosis: Difficulty in walking, not elsewhere classified (R26.2)     Time: 9326-7124 PT Time Calculation (min) (ACUTE ONLY): 25 min  Charges:  $Therapeutic Activity: 23-37 mins                    G Codes:       Lorrin Goodell, PT  Office # [phone] Pager [phone]    Lorriane Shire 03/22/2018, 10:47 AM

## 2018-03-22 NOTE — Progress Notes (Signed)
Pt slowly improving.  Eating without pain.  Nausea better.  Vitals:   03/21/18 2107 03/22/18 0427 03/22/18 0822 03/22/18 1300  BP: (!) 112/44 (!) 119/46  (!) 118/58  Pulse: 97 89  82  Resp: 19 15  12   Temp: 98.8 F (37.1 C) 98.6 F (37 C)  98.2 F (36.8 C)  TempSrc: Oral Oral  Oral  SpO2: 97% 97% 96% 97%  Weight:  218 lb 14.7 oz (99.3 kg)    Height:        Abodomen soreness over rectus hematoma  CBC    Component Value Date/Time   WBC 12.7 (H) 03/22/2018 0653   RBC 2.93 (L) 03/22/2018 0653   HGB 8.4 (L) 03/22/2018 0653   HGB 12.9 08/17/2017 1038   HCT 27.5 (L) 03/22/2018 0653   HCT 38.2 08/17/2017 1038   PLT 190 03/22/2018 0653   PLT 237 08/17/2017 1038   MCV 93.9 03/22/2018 0653   MCV 88 08/17/2017 1038   MCH 28.7 03/22/2018 0653   MCHC 30.5 03/22/2018 0653   RDW 18.4 (H) 03/22/2018 0653   RDW 15.7 (H) 08/17/2017 1038   LYMPHSABS 0.8 03/18/2018 0318   MONOABS 1.9 (H) 03/18/2018 0318   EOSABS 0.0 03/18/2018 0318   BASOSABS 0.0 03/18/2018 0318    BMET    Component Value Date/Time   NA 138 03/22/2018 0653   NA 144 09/14/2017 0921   K 4.0 03/22/2018 0653   CL 106 03/22/2018 0653   CO2 25 03/22/2018 0653   GLUCOSE 119 (H) 03/22/2018 0653   BUN 18 03/22/2018 0653   BUN 12 09/14/2017 0921   CREATININE 0.69 03/22/2018 0653   CALCIUM 8.3 (L) 03/22/2018 0653   GFRNONAA >60 03/22/2018 0653   GFRAA >60 03/22/2018 0017    A: Slowly improving P: Continue aspirin Nothing further to add from vascular stanpoint Call if questions I will arrange outpt follow up for her with me in 1 month  Ruta Hinds, MD [facility] Office: [phone] Pager: [phone]

## 2018-03-22 NOTE — [place] FL2 (Signed)
[place] MEDICAID FL2 LEVEL OF CARE SCREENING TOOL     IDENTIFICATION  Patient Name: [name] Birthdate: November 02, 1941 Sex: female Admission Date (Current Location): 02/28/2018  [facility] and [place] Number:  Herbalist and Address:  [facility], [place] [address] [place], [place] [address]      Provider Number: [id number]  Attending Physician Name and Address:  Mendel Corning, MD  Relative Name and Phone Number:  Thedora Rings, son, [phone]    Current Level of Care: Hospital Recommended Level of Care: [place] Prior Approval Number:    Date Approved/Denied:   PASRR Number: [id number]  Discharge Plan: SNF    Current Diagnoses: Patient Active Problem List   Diagnosis Date Noted  . Pain   . ARF (acute renal failure) ([place]) 03/15/2018  . Hemorrhagic shock ([place])   . Acute respiratory distress 03/13/2018  . Colitis 03/10/2018  . Abnormal CT of the abdomen 03/10/2018  . AKI (acute kidney injury) ([place])   . Ascites   . Pulmonary embolism ([place]) 03/01/2018  . 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[place]) 03/01/2018  . Acute on chronic respiratory failure with hypoxia ([place]) 03/01/2018  . History of laparoscopic cholecystectomy 12/07/2017  . Heme positive stool   . Angiodysplasia of intestine   . Hematochezia   . Malnutrition of moderate degree 02/15/2017  . GI bleed 02/13/2017  . Blood loss anemia 02/13/2017  . Impingement syndrome of left shoulder 02/10/2013  . Chest pain 11/17/2011  . PAD (peripheral artery disease) ([place]) 11/17/2011  . Dyslipidemia 10/17/2011  . Preop cardiovascular exam 06/19/2011  . CAD (coronary artery disease)   . DEEP VENOUS THROMBOPHLEBITIS 12/27/2008  . Hypothyroidism 05/03/2008  . Essential hypertension 05/03/2008  . EMPHYSEMA 05/03/2008  . OBSTRUCTIVE SLEEP APNEA 05/03/2008  . COPD (chronic obstructive pulmonary disease) ([place]) 04/12/2008  . SHORTNESS OF BREATH (SOB) 04/12/2008  . SNORING  04/12/2008    Orientation RESPIRATION BLADDER Height & Weight     Self, Situation, Place  O2(nasal cannula 2L) Incontinent, External catheter Weight: 218 lb 14.7 oz (99.3 kg) Height:  5\' 8"  (172.7 cm)  BEHAVIORAL SYMPTOMS/MOOD NEUROLOGICAL BOWEL NUTRITION STATUS      Continent Diet(please see DC summary)  AMBULATORY STATUS COMMUNICATION OF NEEDS Skin   Extensive Assist Verbally Normal                       Personal Care Assistance Level of Assistance  Bathing, Feeding, Dressing Bathing Assistance: Maximum assistance Feeding assistance: Independent Dressing Assistance: Maximum assistance     Functional Limitations Info  Sight, Hearing, Speech Sight Info: Impaired Hearing Info: Adequate Speech Info: Adequate    SPECIAL CARE FACTORS FREQUENCY  PT (By licensed PT), OT (By licensed OT)     PT Frequency: 5x/week OT Frequency: 5x/week            Contractures Contractures Info: Not present    Additional Factors Info  Code Status, Allergies, Psychotropic Code Status Info: Partial Allergies Info: Zolpidem Tartrate, Pitavastatin, Ropinirole, Penicillins Psychotropic Info: celexa         Current Medications (03/22/2018):  This is the current hospital active medication list Current Facility-Administered Medications  Medication Dose Route Frequency Provider Last Rate Last Dose  . acetaminophen (TYLENOL) tablet 650 mg  650 mg Oral Q6H PRN Rai, Ripudeep K, MD   650 mg at 03/21/18 0745   Or  . acetaminophen (TYLENOL) suppository 650 mg  650 mg Rectal Q6H PRN Rai, Ripudeep K,  MD      . aspirin EC tablet 81 mg  81 mg Oral Daily Rai, Ripudeep K, MD   81 mg at 03/21/18 1605  . chlorhexidine (PERIDEX) 0.12 % solution 15 mL  15 mL Mouth Rinse BID Rai, Ripudeep K, MD   15 mL at 03/21/18 2145  . cholecalciferol (VITAMIN D) tablet 1,000 Units  1,000 Units Oral Daily Rai, Ripudeep K, MD   1,000 Units at 03/21/18 1009  . citalopram (CELEXA) tablet 30 mg  30 mg Oral Daily Rai,  Ripudeep K, MD   30 mg at 03/21/18 1013  . docusate sodium (COLACE) capsule 100 mg  100 mg Oral BID Rai, Ripudeep K, MD   100 mg at 03/21/18 2146  . feeding supplement (ENSURE ENLIVE) (ENSURE ENLIVE) liquid 237 mL  237 mL Oral TID BM Rai, Ripudeep K, MD   237 mL at 03/21/18 2024  . ferrous sulfate tablet 325 mg  325 mg Oral Q breakfast Rai, Ripudeep K, MD   325 mg at 03/21/18 0805  . furosemide (LASIX) injection 40 mg  40 mg Intravenous Once Rai, Ripudeep K, MD      . furosemide (LASIX) tablet 40 mg  40 mg Oral Daily Rai, Ripudeep K, MD   40 mg at 03/21/18 1010  . HYDROmorphone (DILAUDID) injection 0.5 mg  0.5 mg Intravenous Q4H PRN Rai, Ripudeep K, MD   0.5 mg at 03/22/18 0606  . ipratropium-albuterol (DUONEB) 0.5-2.5 (3) MG/3ML nebulizer solution 3 mL  3 mL Inhalation BID Rai, Ripudeep K, MD   3 mL at 03/22/18 1017  . ipratropium-albuterol (DUONEB) 0.5-2.5 (3) MG/3ML nebulizer solution 3 mL  3 mL Nebulization Q6H PRN Rai, Ripudeep K, MD   3 mL at 03/17/18 1750  . lidocaine (LIDODERM) 5 % 1 patch  1 patch Transdermal Q24H Rai, Ripudeep K, MD   1 patch at 03/21/18 1447  . MEDLINE mouth rinse  15 mL Mouth Rinse q12n4p Rai, Ripudeep K, MD   15 mL at 03/21/18 1606  . metoCLOPramide (REGLAN) injection 5 mg  5 mg Intravenous Q6H PRN Rai, Ripudeep K, MD   5 mg at 03/20/18 2136  . oxyCODONE (Oxy IR/ROXICODONE) immediate release tablet 5-10 mg  5-10 mg Oral Q4H PRN Rai, Ripudeep K, MD   10 mg at 03/22/18 0430  . pantoprazole (PROTONIX) EC tablet 40 mg  40 mg Oral Daily Rai, Ripudeep K, MD   40 mg at 03/21/18 1015  . pilocarpine (PILOCAR) 1 % ophthalmic solution 1 drop  1 drop Both Eyes BID Rai, Ripudeep K, MD   1 drop at 03/21/18 2150  . polyethylene glycol (MIRALAX / GLYCOLAX) packet 17 g  17 g Oral Daily Rai, Ripudeep K, MD   17 g at 03/21/18 1148  . pramipexole (MIRAPEX) tablet 0.25 mg  0.25 mg Oral QHS Rai, Ripudeep K, MD   0.25 mg at 03/21/18 2145  . prochlorperazine (COMPAZINE) injection 10 mg  10 mg  Intravenous Q4H PRN Rai, Ripudeep K, MD   10 mg at 03/18/18 1936  . senna (SENOKOT) tablet 8.6 mg  1 tablet Oral BID Rai, Ripudeep K, MD   8.6 mg at 03/21/18 2146  . sodium chloride 0.9 % bolus 500 mL  500 mL Intravenous Once Rai, Ripudeep K, MD      . sodium chloride flush (NS) 0.9 % injection 3 mL  3 mL Intravenous Q12H Rai, Ripudeep K, MD   3 mL at 03/21/18 1150  . sodium chloride flush (NS)  0.9 % injection 3 mL  3 mL Intravenous PRN Rai, Ripudeep K, MD   3 mL at 03/18/18 2226  . sodium chloride flush (NS) 0.9 % injection 3 mL  3 mL Intravenous Q12H Rai, Ripudeep K, MD   3 mL at 03/21/18 2147  . sodium chloride flush (NS) 0.9 % injection 3 mL  3 mL Intravenous PRN Rai, Ripudeep K, MD      . traZODone (DESYREL) tablet 150 mg  150 mg Oral QHS PRN Rai, Vernelle Emerald, MD         Discharge Medications: Please see discharge summary for a list of discharge medications.  Relevant Imaging Results:  Relevant Lab Results:   Additional Information SSN: [id number]  Estanislado Emms, LCSW

## 2018-03-22 NOTE — Clinical Social Work Note (Signed)
Clinical Social Work Assessment  Patient Details  Name: [name] MRN: [id number] Date of Birth: 03-Jun-1941  Date of referral:  03/22/18               Reason for consult:  Facility Placement                Permission sought to share information with:  Facility Sport and exercise psychologist, Family Supports Permission granted to share information::  Yes, Verbal Permission Granted  Name::     [name]  Agency::  SNFs  Relationship::  son  Contact Information:  [phone]  Housing/Transportation Living arrangements for the past 2 months:  Single Family Home Source of Information:  Patient Patient Interpreter Needed:  None Criminal Activity/Legal Involvement Pertinent to Current Situation/Hospitalization:  No - Comment as needed Significant Relationships:  Adult Children, Other Family Members Lives with:  Self Do you feel safe going back to the place where you live?  Yes Need for family participation in patient care:  No (Coment)  Care giving concerns: Patient from home independently. Patient transferred to this CSW's unit yesterday. PT recommending SNF.  Social Worker assessment / plan: CSW met with patient at bedside. Patient alert and oriented, sitting up in bedside chair. CSW discussed PT's recommendation for SNF. Patient agreeable to SNF. Patient stated her husband was previously in two different facilities in [place], and she did not like those facilities, so would prefer a SNF in [place]. CSW explained process of obtaining [employer] authorization before patient can admit to facility. CSW sent out initial referrals. CSW to follow with bed offers. Patient will need insurance authorization before she can admit to facility.  Employment status:  Retired Forensic scientist:  Managed Medicare([employer]) PT Recommendations:  [place] / Referral to community resources:  [place]  Patient/Family's Response to care: Patient appreciative of  care.  Patient/Family's Understanding of and Emotional Response to Diagnosis, Current Treatment, and Prognosis: Patient with understanding of her conditions and recommendation for SNF.   Emotional Assessment Appearance:  Appears stated age Attitude/Demeanor/Rapport:  Engaged Affect (typically observed):  Calm, Pleasant Orientation:  Oriented to Self, Oriented to Place, Oriented to Situation Alcohol / Substance use:  Not Applicable Psych involvement (Current and /or in the community):  No (Comment)  Discharge Needs  Concerns to be addressed:  Discharge Planning Concerns, Care Coordination Readmission within the last 30 days:  No Current discharge risk:  Physical Impairment, Dependent with Mobility Barriers to Discharge:  Continued Medical Work up, [place], LCSW 03/22/2018, 11:29 AM

## 2018-03-22 NOTE — Progress Notes (Addendum)
Messaged MD per c/o constipation.  No BM for last 4 days, straining to have BM now.  Given PO Colace and Senna.  Had small BM 00:30.

## 2018-03-23 LAB — BASIC METABOLIC PANEL
Anion gap: 10 (ref 5–15)
BUN: 17 mg/dL (ref 6–20)
CO2: 25 mmol/L (ref 22–32)
Calcium: 8.2 mg/dL — ABNORMAL LOW (ref 8.9–10.3)
Chloride: 104 mmol/L (ref 101–111)
Creatinine, Ser: 0.71 mg/dL (ref 0.44–1.00)
GFR calc Af Amer: >60 mL/min (ref >60)
GFR calc non Af Amer: >60 mL/min (ref >60)
Glucose, Bld: 101 mg/dL — ABNORMAL HIGH (ref 65–99)
Potassium: 3.9 mmol/L (ref 3.5–5.1)
Sodium: 139 mmol/L (ref 135–145)

## 2018-03-23 LAB — CBC
HCT: 27.6 % — ABNORMAL LOW (ref 36.0–46.0)
Hemoglobin: 8.5 g/dL — ABNORMAL LOW (ref 12.0–15.0)
MCH: 29 pg (ref 26.0–34.0)
MCHC: 30.8 g/dL (ref 30.0–36.0)
MCV: 94.2 fL (ref 78.0–100.0)
Platelets: 201 10*3/uL (ref 150–400)
RBC: 2.93 MIL/uL — ABNORMAL LOW (ref 3.87–5.11)
RDW: 18.1 % — ABNORMAL HIGH (ref 11.5–15.5)
WBC: 12.8 10*3/uL — ABNORMAL HIGH (ref 4.0–10.5)

## 2018-03-23 LAB — MAGNESIUM: Magnesium: 1.6 mg/dL — ABNORMAL LOW (ref 1.7–2.4)

## 2018-03-23 MED ORDER — IPRATROPIUM-ALBUTEROL 0.5-2.5 (3) MG/3ML IN SOLN: 3.0000 mL | RESPIRATORY_TRACT | Status: DC | PRN

## 2018-03-23 MED ORDER — TAMSULOSIN HCL 0.4 MG PO CAPS: 0.4000 mg | ORAL_CAPSULE | Freq: Every day | ORAL | Status: DC

## 2018-03-23 MED ORDER — FUROSEMIDE 40 MG PO TABS
40.0000 mg | ORAL_TABLET | Freq: Two times a day (BID) | ORAL | Status: DC
  Administered 2018-03-23 – 2018-03-26 (×6): 40 mg via ORAL
  Filled 2018-03-23 (×6): qty 1

## 2018-03-23 MED ORDER — MAGNESIUM OXIDE 400 (241.3 MG) MG PO TABS
400.0000 mg | ORAL_TABLET | Freq: Two times a day (BID) | ORAL | Status: DC
  Administered 2018-03-23 – 2018-03-26 (×7): 400 mg via ORAL
  Filled 2018-03-23 (×7): qty 1

## 2018-03-23 NOTE — Progress Notes (Signed)
CSW continuing to follow. [place] started [employer] authorization process yesterday. Awaiting determination. CSW to follow and support.  Estanislado Emms, [place]

## 2018-03-23 NOTE — Progress Notes (Signed)
[facility]                                                                              Patient Demographics  [name], is a 77 y.o. female, DOB - 1941/06/21, [place] date - 02/28/2018   Admitting Physician Etta Quill, DO  Outpatient Primary MD for the patient is [name], MD  Outpatient specialists:   LOS - 22  days   Medical records reviewed and are as summarized below:    Chief Complaint  Patient presents with  . Nausea       Brief summary   77 y/o female with CAD, COPD, prior PE, history of small bowel AVMs and had been off anticoagulants and since February 2018 was admitted on 3/10 with nausea, weakness and dyspnea on exertion, acute respiratory failure with hypoxia.  Workup showed multiple subacute pulmonary embolus on CT scan as well as large acute DVT in the left femoral vein.  She was incidentally found to have severe stenosis of superior mesenteric artery.  Patient was started on heparin drip, admitted to hospitalist service, had stenting of SMA on 3/18.  Since the stent placement, she had worsening abdominal pain, nausea and vomiting.  CTA abdomen and pelvis on 3/19 revealed patent SMA stent with moderate abdominopelvic ascites, colonic wall thickening.  GI was consulted, symptoms thought to be from ischemic colitis.  However hemoglobin continued to drop and on 3/24 AM, patient had a rapid response with hypotension, acute hypoxic respiratory failure, severe abdominal pain.  Repeat CTabdomen pelvis on 3/24 showed a new large anterior abdominal wall hematoma measuring 16.9 x 11.9  X 12.4 cm.  Hemoglobin 6.6.  Heparin drip, Plavix were discontinued, patient was transfused 2 units packed RBC and transferred to ICU.  She was placed on vasopressors per IVC filter placed on 3/24.  Patient's creatinine function continued to trend up, renal ultrasound did not show any obstruction or hydronephrosis, possibly due to hypoperfusion/ATN.  For  the new onset ascites, patient underwent paracentesis on 3/20 which showed SAAG >1.1 Vascular surgery following patient, recommended starting aspirin once hemoglobin has remained stable.   Assessment & Plan    Principal Problem: Right-sided ischemic colitis, superior mesenteric artery stenosis status post stent placement to SMA on 3/18.  -Nausea, vomiting, abdominal pain improved.  Patient tolerating soft solids.  -Vascular surgery following, continue aspirin 81 mg daily, outpatient follow-up with Dr. [name] -Antibiotics completed 3/27 however low threshold for initiating if fevers or worsening leukocytosis.   Active Problems: Acute hypoxic respiratory failure with pulmonary emboli, left femoral DVT, COPD -Status post IVC filter on 3/24 -O2 sats 99% on 2 L, wean O2 as tolerated  -I's and O's with 17.7 L positive, received Lasix 40 mg IV x1 yesterday with improvement in her respiratory status.  Increased oral Lasix to 40 mg twice a day.  Also placed on fluid restriction 1500 cc. -Duo nebs as needed  Acute on chronic blood loss anemia, hemorrhagic shock, rectus sheath hematoma, abdominal pain -H&H stable -Hemorrhagic shock resolved, had briefly required vasopressors  Acute kidney injury with non-anion gap metabolic acidosis -Possibly due to hypoperfusion,  ATN and contrast.  Baseline creatinine 0.7-1, peaked at 3.2 -Renal ultrasound did not show any obstruction or hydronephrosis -Creatinine stable, increase to Lasix to 40 mg twice a day, monitor creatinine function closely.  New ascites status post paracentesis 3/20 - SAAG >1.1, felt inflammatory, CT scan with cirrhosis, ischemic colitis -GI signed off  Urinary retention -Patient had an episode of urinary retention during hospitalization, requiring Foley catheter a week ago -Foley catheter discontinued, no acute issues  Hypomagnesemia -Replaced  Code Status: Partial DVT Prophylaxis: SCD Family Communication: Discussed in detail  with the patient, all imaging results, lab results explained to the patient   Disposition Plan: Skilled nursing facility next 24-48 hours pending bed available Time Spent in minutes : 25 minutes  Procedures:   3/18 SMA stenosis status post stent CT ABD /Pelvis 3/19>Interval development of moderate abdominopelvic ascites,Colonic wall thickening involving the cecum, ascending, proximal transverse colon 3/20 status post paracentesis with 100 cc removed CT abd /pelvis 3/24>>New, large anterior abdominal wall hematoma measuring 16.9 x 11.9x 12.4 cm.2. Small volume ascites with nodular appearance of the liver concerning for cirrhosis.. Medium-sized right pleural effusion 3/24: Retrievable IVC filter placed  Consultants:   PCCM Vascular surgery IR Gastroenterology  Antimicrobials:      Medications  Scheduled Meds: . aspirin EC  81 mg Oral Daily  . chlorhexidine  15 mL Mouth Rinse BID  . cholecalciferol  1,000 Units Oral Daily  . citalopram  30 mg Oral Daily  . docusate sodium  100 mg Oral BID  . feeding supplement (ENSURE ENLIVE)  237 mL Oral TID BM  . ferrous sulfate  325 mg Oral Q breakfast  . furosemide  40 mg Oral BID  . ipratropium-albuterol  3 mL Inhalation BID  . lidocaine  1 patch Transdermal Q24H  . mouth rinse  15 mL Mouth Rinse q12n4p  . pantoprazole  40 mg Oral Daily  . pilocarpine  1 drop Both Eyes BID  . polyethylene glycol  17 g Oral Daily  . pramipexole  0.25 mg Oral QHS  . senna  1 tablet Oral BID  . sodium chloride flush  3 mL Intravenous Q12H  . sodium chloride flush  3 mL Intravenous Q12H   Continuous Infusions: . sodium chloride     PRN Meds:.acetaminophen **OR** acetaminophen, HYDROmorphone (DILAUDID) injection, ipratropium-albuterol, metoCLOPramide (REGLAN) injection, oxyCODONE, prochlorperazine, sodium chloride flush, sodium chloride flush, traZODone   Antibiotics   Anti-infectives (From admission, onward)   Start     Dose/Rate Route Frequency  Ordered Stop   03/16/18 0600  ciprofloxacin (CIPRO) IVPB 200 mg  Status:  Discontinued     200 mg 100 mL/hr over 60 Minutes Intravenous Every 24 hours 03/15/18 0933 03/17/18 1223   03/15/18 0600  ciprofloxacin (CIPRO) IVPB 400 mg  Status:  Discontinued     400 mg 200 mL/hr over 60 Minutes Intravenous Every 24 hours 03/14/18 1259 03/15/18 0933   03/13/18 1800  ciprofloxacin (CIPRO) IVPB 400 mg  Status:  Discontinued     400 mg 200 mL/hr over 60 Minutes Intravenous Every 12 hours 03/13/18 1711 03/14/18 1259   03/13/18 1800  metroNIDAZOLE (FLAGYL) IVPB 500 mg  Status:  Discontinued     500 mg 100 mL/hr over 60 Minutes Intravenous Every 8 hours 03/13/18 1711 03/16/18 1100   03/10/18 1230  metroNIDAZOLE (FLAGYL) IVPB 500 mg  Status:  Discontinued     500 mg 100 mL/hr over 60 Minutes Intravenous Every 8 hours 03/10/18 1208 03/10/18 1815   03/10/18  1230  ciprofloxacin (CIPRO) IVPB 400 mg  Status:  Discontinued     400 mg 200 mL/hr over 60 Minutes Intravenous Every 12 hours 03/10/18 1208 03/10/18 1815        Subjective:   [name] was seen and examined today.  States spelled slightly better after received IV Lasix yesterday.  No fevers or chills.  Still has peripheral edema.  No chest pain.  Abdominal pain is improving.  Denies nausea or vomiting, new weakness, numbness or tingling.    Objective:   Vitals:   03/23/18 0400 03/23/18 0500 03/23/18 0600 03/23/18 0824  BP:  (!) 153/65    Pulse: 79 98    Resp: 15 18    Temp:  99 F (37.2 C)    TempSrc:  Oral    SpO2: 98% 93%  98%  Weight:   109 kg (240 lb 4.8 oz)   Height:        Intake/Output Summary (Last 24 hours) at 03/23/2018 1423 Last data filed at 03/23/2018 1129 Gross per 24 hour  Intake 9 ml  Output -  Net 9 ml     Wt Readings from Last 3 Encounters:  03/23/18 109 kg (240 lb 4.8 oz)  11/19/17 84.1 kg (185 lb 6.4 oz)  09/14/17 78.6 kg (173 lb 3.2 oz)     Exam   General: Alert and oriented x 3, NAD  Eyes:    HEENT:  Atraumatic, normocephalic,  Cardiovascular: S1 S2 clear, RRR, 2+ pitting edema.   Respiratory: Decreased breath sound at the bases  Gastrointestinal: Soft, nontender, nondistended, + bowel sounds  Ext: 2+ pitting edema  Neuro: no new deficit  Musculoskeletal: No digital cyanosis, clubbing  Skin: No rashes  Psych: Normal affect and demeanor, alert and oriented x3    Data Reviewed:  I have personally reviewed following labs and imaging studies  Micro Results Recent Results (from the past 240 hour(s))  Culture, Urine     Status: Abnormal   Collection Time: 03/13/18  4:42 PM  Result Value Ref Range Status   Specimen Description URINE, CATHETERIZED  Final   Special Requests NONE  Final   Culture (A)  Final    <10,000 COLONIES/mL INSIGNIFICANT GROWTH Performed at [facility], [address], [place], [place] [address]    Report Status 03/14/2018 FINAL  Final  Culture, blood (Routine X 2) w Reflex to ID Panel     Status: None   Collection Time: 03/14/18 11:15 AM  Result Value Ref Range Status   Specimen Description BLOOD LEFT HAND  Final   Special Requests   Final    BOTTLES DRAWN AEROBIC ONLY Blood Culture adequate volume   Culture   Final    NO GROWTH 5 DAYS Performed at [facility], [place] [address] [place], [place] [address]    Report Status 03/19/2018 FINAL  Final  Culture, blood (Routine X 2) w Reflex to ID Panel     Status: None   Collection Time: 03/14/18 11:17 AM  Result Value Ref Range Status   Specimen Description BLOOD LEFT HAND  Final   Special Requests   Final    BOTTLES DRAWN AEROBIC ONLY Blood Culture results may not be optimal due to an inadequate volume of blood received in culture bottles   Culture   Final    NO GROWTH 5 DAYS Performed at [facility], [place] [address] [place],  [address]    Report Status 03/19/2018 FINAL  Final    Radiology Reports Ct Abdomen Pelvis Wo Contrast  Addendum Date: 03/14/2018    ADDENDUM REPORT: 03/14/2018 04:14 ADDENDUM: These results were called by telephone at the time of interpretation on 03/14/2018 at 4:04 am to [facility] , who verbally acknowledged these results. Electronically Signed   By: Ulyses Jarred M.D.   On: 03/14/2018 04:14   Result Date: 03/14/2018 CLINICAL DATA:  Abdominal pain and fever EXAM: CT ABDOMEN AND PELVIS WITHOUT CONTRAST TECHNIQUE: Multidetector CT imaging of the abdomen and pelvis was performed following the standard protocol without IV contrast. COMPARISON:  CT abdomen pelvis 03/09/2018 FINDINGS: Examination is degraded by streak artifact from external patient devices and from the arms down positioning of the patient, creating multiple streak artifacts and decreasing sensitivity and specificity of this scan. Lower chest: Right pleural effusion and associated atelectasis. Hepatobiliary: Right upper quadrant ascites. Nodular appearance of the liver. No focal liver lesion. Status post cholecystectomy. Pancreas: Atrophic appearance of the pancreas. Spleen: Normal. Adrenals/Urinary Tract: --Adrenal glands: Normal. --Right kidney/ureter: No hydronephrosis, perinephric stranding or nephrolithiasis. No obstructing ureteral stones. --Left kidney/ureter: No hydronephrosis, perinephric stranding or nephrolithiasis. No obstructing ureteral stones. --Urinary bladder: Normal appearance for the degree of distention. Stomach/Bowel: --Stomach/Duodenum: No hiatal hernia or other gastric abnormality. Normal duodenal course. --Small bowel: No dilatation or inflammation. --Colon: Diffuse colonic diverticulosis without acute inflammation. --Appendix: Normal. Vascular/Lymphatic: Atherosclerotic calcification is present within the non-aneurysmal abdominal aorta, without hemodynamically significant stenosis. No abdominal or pelvic lymphadenopathy. Reproductive: Pelvis largely obscured by streak artifact. Musculoskeletal. Right total hip arthroplasty. L1-L3 posterior spinal fusion.  Other: Large anterior abdominal wall hematoma measuring up to 16.9 x 11.9 x 12.4 cm. This is new compared to the recent study from 03/09/2018. This exerts mass effect on the intra-abdominal contents. IMPRESSION: 1. New, large anterior abdominal wall hematoma measuring 16.9 x 11.9 x 12.4 cm. 2. Small volume ascites with nodular appearance of the liver concerning for cirrhosis. 3. Medium-sized right pleural effusion with associated atelectasis. 4. Diffuse colonic diverticulosis without acute inflammation. 5.  Aortic Atherosclerosis (ICD10-I70.0). Electronically Signed: By: Ulyses Jarred M.D. On: 03/14/2018 03:45   Ct Angio Chest Pe W/cm &/or Wo Cm  Result Date: 03/01/2018 CLINICAL DATA:  Subacute onset of nausea. Generalized abdominal pain. Mild generalized chest pain and shortness of breath. EXAM: CT ANGIOGRAPHY CHEST CT ABDOMEN AND PELVIS WITH CONTRAST TECHNIQUE: Multidetector CT imaging of the chest was performed using the standard protocol during bolus administration of intravenous contrast. Multiplanar CT image reconstructions and MIPs were obtained to evaluate the vascular anatomy. Multidetector CT imaging of the abdomen and pelvis was performed using the standard protocol during bolus administration of intravenous contrast. CONTRAST:  80 mL of Isovue 370 IV contrast COMPARISON:  CT of the abdomen and pelvis performed 11/08/2017, and CT of the thoracic spine performed 07/26/2014 FINDINGS: CTA CHEST FINDINGS Cardiovascular: There appears to be subacute or chronic pulmonary embolus within the pulmonary artery to the left lower lobe, extending minimally into segmental branches. No additional pulmonary embolus is identified. The RV/LV ratio of 1.1 may reflect right heart strain. Diffuse coronary artery calcifications are seen. Diffuse calcification is noted along the aortic arch and proximal great vessels. The heart remains normal in size. The patient is status post median sternotomy. There is a chronic fracture  of the inferior-most sternal wire. Mediastinum/Nodes: The mediastinum is otherwise unremarkable in appearance. No mediastinal lymphadenopathy is seen. No pericardial effusion is identified. The thyroid gland is grossly unremarkable. No axillary lymphadenopathy is seen. Lungs/Pleura: Minimal  bilateral atelectasis is noted. The lungs are otherwise clear. Mild scarring is noted at the lung apices. No significant focal consolidation, pleural effusion or pneumothorax is seen. No masses are identified. Musculoskeletal: No acute osseous abnormalities are identified. The patient's right shoulder arthroplasty is incompletely imaged but appears grossly unremarkable. The visualized musculature is unremarkable in appearance. Diffuse calcification is noted about the patient's bilateral breast implants. Review of the MIP images confirms the above findings. CT ABDOMEN and PELVIS FINDINGS Hepatobiliary: The liver is unremarkable in appearance. The patient is status post cholecystectomy, with clips noted at the gallbladder fossa. There is corresponding distention of the intrahepatic biliary ducts and common bile duct. Pancreas: The pancreas is atrophic and grossly unremarkable in appearance. Spleen: The spleen is unremarkable in appearance. Adrenals/Urinary Tract: Minimal calcification is noted at the left adrenal gland, likely adjacent to a small underlying adrenal lesion. The right adrenal gland is unremarkable. A right renal cyst is again noted, better characterized on the prior study. The kidneys are otherwise unremarkable. There is no evidence of hydronephrosis. No perinephric stranding is seen. Stomach/Bowel: The stomach is unremarkable in appearance. The small bowel is within normal limits. The appendix is normal in caliber, without evidence of appendicitis. The colon is unremarkable in appearance. Vascular/Lymphatic: Diffuse calcification is seen along the abdominal aorta and its branches, including along the right renal  artery and superior mesenteric artery. There is likely relatively severe luminal narrowing at the proximal superior mesenteric artery. The abdominal aorta is otherwise grossly unremarkable. The inferior vena cava is grossly unremarkable. No retroperitoneal lymphadenopathy is seen. No pelvic sidewall lymphadenopathy is identified. Reproductive: The bladder is mildly distended and grossly unremarkable. The uterus is unremarkable in appearance. The ovaries are grossly symmetric. No suspicious adnexal masses are seen. Other: No additional soft tissue abnormalities are seen. Musculoskeletal: No acute osseous abnormalities are identified. The patient's right hip arthroplasty is incompletely imaged, but appears grossly unremarkable. The patient is status post lumbar spinal fusion at L1-L3, with underlying decompression from L1-L4. The visualized musculature is unremarkable in appearance. Review of the MIP images confirms the above findings. IMPRESSION: 1. Apparent subacute or chronic pulmonary embolus within the pulmonary artery to the left lower lung lobe, extending minimally into segmental branches. RV/LV ratio of 1.1 may reflect right heart strain. Would correlate with the patient's symptoms. 2. Minimal bilateral atelectasis noted. Mild scarring at the lung apices. 3. Diffuse coronary artery calcifications seen. 4. Diffuse aortic atherosclerosis, with diffuse calcification along the right renal artery and superior mesenteric artery. Likely relatively severe luminal narrowing at the proximal superior mesenteric artery. Would correlate for any associated symptoms. Critical Value/emergent results were called by telephone at the time of interpretation on 03/01/2018 at 1:00 am to the ER clinical team, who verbally acknowledged these results. Electronically Signed   By: Garald Balding M.D.   On: 03/01/2018 01:03   Ct Abdomen Pelvis W Contrast  Result Date: 03/01/2018 CLINICAL DATA:  Subacute onset of nausea. Generalized  abdominal pain. Mild generalized chest pain and shortness of breath. EXAM: CT ANGIOGRAPHY CHEST CT ABDOMEN AND PELVIS WITH CONTRAST TECHNIQUE: Multidetector CT imaging of the chest was performed using the standard protocol during bolus administration of intravenous contrast. Multiplanar CT image reconstructions and MIPs were obtained to evaluate the vascular anatomy. Multidetector CT imaging of the abdomen and pelvis was performed using the standard protocol during bolus administration of intravenous contrast. CONTRAST:  80 mL of Isovue 370 IV contrast COMPARISON:  CT of the abdomen and pelvis performed 11/08/2017,  and CT of the thoracic spine performed 07/26/2014 FINDINGS: CTA CHEST FINDINGS Cardiovascular: There appears to be subacute or chronic pulmonary embolus within the pulmonary artery to the left lower lobe, extending minimally into segmental branches. No additional pulmonary embolus is identified. The RV/LV ratio of 1.1 may reflect right heart strain. Diffuse coronary artery calcifications are seen. Diffuse calcification is noted along the aortic arch and proximal great vessels. The heart remains normal in size. The patient is status post median sternotomy. There is a chronic fracture of the inferior-most sternal wire. Mediastinum/Nodes: The mediastinum is otherwise unremarkable in appearance. No mediastinal lymphadenopathy is seen. No pericardial effusion is identified. The thyroid gland is grossly unremarkable. No axillary lymphadenopathy is seen. Lungs/Pleura: Minimal bilateral atelectasis is noted. The lungs are otherwise clear. Mild scarring is noted at the lung apices. No significant focal consolidation, pleural effusion or pneumothorax is seen. No masses are identified. Musculoskeletal: No acute osseous abnormalities are identified. The patient's right shoulder arthroplasty is incompletely imaged but appears grossly unremarkable. The visualized musculature is unremarkable in appearance. Diffuse  calcification is noted about the patient's bilateral breast implants. Review of the MIP images confirms the above findings. CT ABDOMEN and PELVIS FINDINGS Hepatobiliary: The liver is unremarkable in appearance. The patient is status post cholecystectomy, with clips noted at the gallbladder fossa. There is corresponding distention of the intrahepatic biliary ducts and common bile duct. Pancreas: The pancreas is atrophic and grossly unremarkable in appearance. Spleen: The spleen is unremarkable in appearance. Adrenals/Urinary Tract: Minimal calcification is noted at the left adrenal gland, likely adjacent to a small underlying adrenal lesion. The right adrenal gland is unremarkable. A right renal cyst is again noted, better characterized on the prior study. The kidneys are otherwise unremarkable. There is no evidence of hydronephrosis. No perinephric stranding is seen. Stomach/Bowel: The stomach is unremarkable in appearance. The small bowel is within normal limits. The appendix is normal in caliber, without evidence of appendicitis. The colon is unremarkable in appearance. Vascular/Lymphatic: Diffuse calcification is seen along the abdominal aorta and its branches, including along the right renal artery and superior mesenteric artery. There is likely relatively severe luminal narrowing at the proximal superior mesenteric artery. The abdominal aorta is otherwise grossly unremarkable. The inferior vena cava is grossly unremarkable. No retroperitoneal lymphadenopathy is seen. No pelvic sidewall lymphadenopathy is identified. Reproductive: The bladder is mildly distended and grossly unremarkable. The uterus is unremarkable in appearance. The ovaries are grossly symmetric. No suspicious adnexal masses are seen. Other: No additional soft tissue abnormalities are seen. Musculoskeletal: No acute osseous abnormalities are identified. The patient's right hip arthroplasty is incompletely imaged, but appears grossly  unremarkable. The patient is status post lumbar spinal fusion at L1-L3, with underlying decompression from L1-L4. The visualized musculature is unremarkable in appearance. Review of the MIP images confirms the above findings. IMPRESSION: 1. Apparent subacute or chronic pulmonary embolus within the pulmonary artery to the left lower lung lobe, extending minimally into segmental branches. RV/LV ratio of 1.1 may reflect right heart strain. Would correlate with the patient's symptoms. 2. Minimal bilateral atelectasis noted. Mild scarring at the lung apices. 3. Diffuse coronary artery calcifications seen. 4. Diffuse aortic atherosclerosis, with diffuse calcification along the right renal artery and superior mesenteric artery. Likely relatively severe luminal narrowing at the proximal superior mesenteric artery. Would correlate for any associated symptoms. Critical Value/emergent results were called by telephone at the time of interpretation on 03/01/2018 at 1:00 am to the ER clinical team, who verbally acknowledged these results.  Electronically Signed   By: Garald Balding M.D.   On: 03/01/2018 01:03   Ir Ivc Filter Plmt / S&i /img Guid/mod Sed  Result Date: 03/14/2018 INDICATION: 77 year old with history of pulmonary embolism and left lower extremity DVT. Patient now has a large abdominal wall hematoma and no longer candidate for anticoagulation. EXAM: IVC FILTER PLACEMENT; IVC VENOGRAM; ULTRASOUND FOR VASCULAR ACCESS Physician: Stephan Minister. Anselm Pancoast, MD MEDICATIONS: None. ANESTHESIA/SEDATION: Fentanyl 25 mcg IV; Versed 0.5 mg IV Moderate Sedation Time:  22 minutes The patient was continuously monitored during the procedure by the interventional radiology nurse under my direct supervision. CONTRAST:  Carbon dioxide FLUOROSCOPY TIME:  Fluoroscopy Time: 7 minutes 6 seconds (327 mGy). COMPLICATIONS: None immediate. PROCEDURE: Informed consent was obtained for an IVC venogram and filter placement. Ultrasound demonstrated a patent  right internal jugular vein. Ultrasound images were obtained for documentation. The right side of the neck was prepped and draped in a sterile fashion. Maximal barrier sterile technique was utilized including caps, mask, sterile gowns, sterile gloves, sterile drape, hand hygiene and skin antiseptic. The skin was anesthetized with 1% lidocaine. A 21 gauge needle was directed into the vein with ultrasound guidance and a micropuncture dilator set was placed. A wire was advanced into the IVC. The filter sheath was advanced over the wire into the IVC. An IVC venogram was performed with carbon dioxide. Limited evaluation of the IVC due to carbon dioxide and the lumbar surgical hardware. Therefore, a MPA catheter was used to cannulate bilateral renal veins. Renal venography was also performed with carbon dioxide bilaterally. The location of the renal veins were confidently identified. Fluoroscopic images were obtained for documentation. A Bard Denali filter was deployed in the IVC below the lowest renal vein. Follow-up images were obtained to confirm adequate deployment of the filter. Vascular sheath was removed with manual compression. FINDINGS: IVC was patent. Bilateral renal veins were identified. The filter was deployed below the lowest renal vein. IMPRESSION: Successful placement of a retrievable IVC filter. PLAN: This IVC filter is potentially retrievable. The patient will be assessed for filter retrieval by Interventional Radiology in approximately 8-12 weeks. Further recommendations regarding filter retrieval, continued surveillance or declaration of device permanence, will be made at that time. Electronically Signed   By: Markus Daft M.D.   On: 03/14/2018 13:03   US Renal  Result Date: 03/15/2018 CLINICAL DATA:  Acute kidney injury. EXAM: RENAL / URINARY TRACT ULTRASOUND COMPLETE COMPARISON:  CT, 03/14/2018. FINDINGS: Right Kidney: Length: 8.6 cm. Diffuse cortical thinning. Increased renal parenchymal  echogenicity. There is a 2.6 x 2.0 x 2.3 cm cyst arising from the lower pole. No other masses, no stones and no hydronephrosis. Left Kidney: Length: 9.2 cm. Mild diffuse cortical thinning. Borderline increased renal parenchymal echogenicity. No mass, stone or hydronephrosis. Lower pole partly obscured by overlying bowel gas. Bladder: Decompressed with a Foley catheter.  Not well assessed. Patient also has ascites and a right pleural effusion. IMPRESSION: 1. Findings consistent with medical renal disease with renal cortical thinning, greater on the right, and increased renal parenchymal echogenicity on the right and borderline increased renal parenchymal echogenicity on the left. 2. No acute findings.  No hydronephrosis. 3. 2.6 cm right lower pole renal cyst. Electronically Signed   By: Lajean Manes M.D.   On: 03/15/2018 15:45   Dg Chest Port 1 View  Result Date: 03/18/2018 CLINICAL DATA:  Dyspnea, bilateral leg and arm swelling, abdominal pain and nausea for the past 3-4 days. History of COPD, CHF, and  previous CABG. EXAM: PORTABLE CHEST 1 VIEW COMPARISON:  Portable chest x-ray of March 15, 2018 FINDINGS: The lungs are well-expanded. There is no focal infiltrate. There is no pleural effusion. The cardiac silhouette is enlarged. The pulmonary vascularity is not engorged. There are post CABG changes. There is calcification in the wall of the thoracic aorta. There are bilateral breast implants exhibiting capsular calcification. IMPRESSION: COPD. Mild cardiomegaly. Previous CABG. No pulmonary edema, pneumonia, or pleural effusion. Thoracic aortic atherosclerosis. Electronically Signed   By: David  Martinique M.D.   On: 03/18/2018 09:15   Dg Chest Port 1 View  Result Date: 03/15/2018 CLINICAL DATA:  Pleural effusion. EXAM: PORTABLE CHEST 1 VIEW COMPARISON:  Radiograph of March 13, 2018. FINDINGS: The heart size and mediastinal contours are within normal limits. Atherosclerosis of thoracic aorta is noted. Status post  coronary artery bypass graft. No pneumothorax is noted. Increased right basilar opacity is noted concerning for worsening atelectasis or infiltrate with associated pleural effusion. Status post right shoulder arthroplasty. IMPRESSION: Increased right basilar atelectasis or infiltrate is noted with possible associated pleural effusion. Aortic Atherosclerosis (ICD10-I70.0). Electronically Signed   By: Marijo Conception, M.D.   On: 03/15/2018 07:45   Dg Chest Port 1 View  Result Date: 03/13/2018 CLINICAL DATA:  77 year old with shortness of breath. EXAM: PORTABLE CHEST 1 VIEW COMPARISON:  Chest CT 02/28/2018 and chest radiograph 11/08/2017 FINDINGS: Bilateral breast implants. No focal airspace disease or pulmonary edema. Right shoulder replacement. Atherosclerotic calcifications at the aortic arch. Prior CABG procedure. Sclerosis and degenerative changes at the left shoulder. Surgical hardware in the lumbar spine. Heart size is within normal limits. IMPRESSION: No acute chest findings. Electronically Signed   By: Markus Daft M.D.   On: 03/13/2018 12:52   Dg Abd 2 Views  Result Date: 03/12/2018 CLINICAL DATA:  Lower abdominal pain beginning this morning post vascular surgery EXAM: ABDOMEN - 2 VIEW COMPARISON:  03/08/2018 FINDINGS: Normal bowel gas pattern. No bowel dilatation or bowel wall thickening. Gas present in rectum. Bones demineralized with evidence of prior lumbar fusion and prior RIGHT hip replacement. Scattered degenerative changes of lower thoracic spine. Surgical clips RIGHT upper quadrant question cholecystectomy. Degenerative changes LEFT hip joint. Question vascular calcification versus 8 mm diameter calculus at upper pole LEFT kidney. IMPRESSION: No acute abnormalities. Electronically Signed   By: Lavonia Dana M.D.   On: 03/12/2018 19:28   Dg Abd Portable 1v  Result Date: 03/16/2018 CLINICAL DATA:  Abdominal pain EXAM: PORTABLE ABDOMEN - 1 VIEW COMPARISON:  None. FINDINGS: There is no bowel  dilatation or air-fluid level to suggest bowel obstruction. No free air. There is a total hip replacement on the right as well as postoperative change in the lumbar region. There is a filter in the inferior vena cava. There are calcified breast implants bilaterally. IMPRESSION: No demonstrable bowel obstruction or free air. Electronically Signed   By: Lowella Grip III M.D.   On: 03/16/2018 11:37   Dg Abd Portable 1v  Result Date: 03/08/2018 CLINICAL DATA:  Nausea and vomiting EXAM: PORTABLE ABDOMEN - 1 VIEW COMPARISON:  CT 02/28/2018 FINDINGS: Surgical hardware in the upper lumbar spine. Surgical clips in the right upper quadrant. Nonobstructed bowel-gas pattern. Radiopaque material in the bladder. Probable diverticulum on the right side of the bladder. Status post right hip replacement with fixating screw paralleling the cortex of the right inter pelvis. IMPRESSION: 1. Nonobstructed gas pattern 2. Contrast material within the bladder Electronically Signed   By: Madie Reno.D.  On: 03/08/2018 22:30   Ct Angio Abd/pel W/ And/or W/o  Result Date: 03/09/2018 CLINICAL DATA:  Upper abdominal pain and nausea after mesenteric stent placement today. EXAM: CTA ABDOMEN AND PELVIS wITHOUT AND WITH CONTRAST TECHNIQUE: Multidetector CT imaging of the abdomen and pelvis was performed using the standard protocol during bolus administration of intravenous contrast. Multiplanar reconstructed images and MIPs were obtained and reviewed to evaluate the vascular anatomy. CONTRAST:  100 cc Isovue 370 IV COMPARISON:  Abdomen/pelvis CT 02/28/2018 FINDINGS: VASCULAR Aorta: Moderate atherosclerosis. Normal caliber aorta without aneurysm, dissection, vasculitis or significant stenosis. Celiac: Plaque at the origin causes approximately 50% stenosis. No dissection or vasculitis. Distal branches are patent. SMA: Patent proximal SMA stent with resolved origin stenosis. Calcified distally but patent. No evidence of dissection. No  evidence of contrast extravasation. Renals: Calcified plaque at the origins. No dissection or vasculitis. IMA: Patent without evidence of aneurysm, dissection, vasculitis or significant stenosis. Inflow: Patent without evidence of aneurysm, dissection, vasculitis or significant stenosis. Proximal Outflow: Bilateral common femoral and visualized portions of the superficial and profunda femoral arteries are patent without evidence of aneurysm, dissection, vasculitis or significant stenosis. No evidence of pseudoaneurysm formation or active extravasation at the puncture site. Veins: Not well assessed on arterial phase imaging. There is presumed mixing of opacified and non-opacified blood in the mesenteric venous confluence. Review of the MIP images confirms the above findings. NON-VASCULAR Lower chest: Linear atelectasis in both lower lobes. Trace right pleural effusion. Coronary artery calcifications. Peripherally calcified left breast implant partially included. Hepatobiliary: Slight capsular nodularity raises concern for cirrhosis. No discrete focal lesion. Postcholecystectomy with grossly unchanged biliary prominence allowing for arterial phase technique. Pancreas: Parenchymal atrophy. No ductal dilatation or inflammation. Spleen: Normal in size without focal abnormality. Adrenals/Urinary Tract: Mild left adrenal thickening. Normal right adrenal gland. Excreted IV contrast in both kidneys and urinary bladder from angiogram earlier this day. No hydronephrosis or perinephric edema. Bilateral renal cysts. Stomach/Bowel: Small hiatal hernia. Stomach is nondistended. Presence of intra-abdominal ascites limits bowel evaluation. There is colonic wall thickening involving the ascending colon, hepatic flexure, unlikely proximal transverse colon. No definite small bowel inflammation. Colonic diverticulosis throughout the colon. Lymphatic: No definite adenopathy. No evidence of retroperitoneal hemorrhage. Reproductive: Uterus  obscured by right hip arthroplasty. Other: Development of moderate volume abdominopelvic ascites. Fluid may be minimally complex, however does not represent values concerning for hemorrhage. There is mild mesenteric edema. No free air. Small supraumbilical hernia contains fat and small amount of free fluid. Musculoskeletal: Postsurgical change in the lumbar spine, unchanged from prior. Right hip arthroplasty. IMPRESSION: VASCULAR 1. Placement of superior mesenteric artery stent which is widely patent. No evidence of dissection or postprocedural pseudoaneurysm/hematoma. 2. Diffuse aortic and branch atherosclerosis. NON-VASCULAR 1. Interval development of moderate abdominopelvic ascites from prior exam. Fluid is minimally complex but does not represent hemorrhage. 2. Colonic wall thickening involving the cecum, ascending, and proximal transverse colon. This is new since prior CT. Exact etiology is uncertain. Infectious or inflammatory etiologies are considered, colonic wall edema related to reperfusion could have a similar appearance. No increased density to suggest bowel wall hemorrhage. 3. Colonic diverticulosis without specific findings to suggest diverticulitis. 4. Question of nodular hepatic contours raises concern for cirrhosis. Recommend correlation with cirrhosis risk factors. Electronically Signed   By: Jeb Levering M.D.   On: 03/09/2018 03:18   Ir Paracentesis  Result Date: 03/10/2018 INDICATION: Recent superior mesenteric artery stent placement. New ascites. Request for diagnostic paracentesis. EXAM: ULTRASOUND GUIDED RIGHT LATERAL ABDOMEN  PARACENTESIS MEDICATIONS: 2% lidocaine = 10 mL COMPLICATIONS: None immediate. PROCEDURE: Informed written consent was obtained from the patient after a discussion of the risks, benefits and alternatives to treatment. A timeout was performed prior to the initiation of the procedure. Initial ultrasound scanning demonstrates a small amount of ascites within the right  lateral abdomen. The right lateral abdomen was prepped and draped in the usual sterile fashion. 1% lidocaine with epinephrine was used for local anesthesia. Following this, a 19 gauge, 7-cm, Yueh catheter was introduced. An ultrasound image was saved for documentation purposes. The paracentesis was performed. The catheter was removed and a dressing was applied. The patient tolerated the procedure well without immediate post procedural complication. FINDINGS: A total of approximately 100 ml of clear red fluid was removed. Samples were sent to the laboratory as requested by the clinical team. IMPRESSION: Successful ultrasound-guided paracentesis yielding 174mL of peritoneal fluid. Read by: Gareth Eagle, PA-C Electronically Signed   By: Lucrezia Europe M.D.   On: 03/10/2018 11:13    Lab Data:  CBC: Recent Labs  Lab 03/16/18 1600 03/16/18 2208 03/17/18 0430 03/17/18 0757 03/18/18 0318 03/19/18 0300 03/20/18 0332 03/21/18 0324 03/22/18 0653 03/23/18 0407  WBC 18.9* 17.4* 15.9* 16.6* 15.6* 15.3* 12.6* 11.7* 12.7* 12.8*  NEUTROABS 15.3* 14.1* 13.0* 13.6* 12.9*  --   --   --   --   --   HGB 8.3* 7.9* 7.7* 7.8* 7.9* 8.2* 8.4* 8.2* 8.4* 8.5*  HCT 24.5* 23.9* 23.2* 23.6* 24.2* 26.0* 26.7* 26.3* 27.5* 27.6*  MCV 86.0 86.6 87.9 87.7 89.3 90.9 92.7 93.6 93.9 94.2  PLT 129* 121* 125* 119* 120* 134* 145* 154 190 262   Basic Metabolic Panel: Recent Labs  Lab 03/17/18 0430 03/18/18 0318  03/19/18 0300 03/20/18 0714 03/21/18 0324 03/22/18 0653 03/23/18 0407  NA 136  --    < > 136 139 138 138 139  K 3.7  --    < > 3.7 3.7 3.7 4.0 3.9  CL 107  --    < > 108 107 107 106 104  CO2 22  --    < > 20* 24 25 25 25   GLUCOSE 133*  --    < > 107* 100* 126* 119* 101*  BUN 51*  --    < > 35* 25* 21* 18 17  CREATININE 2.15*  --    < > 0.99 0.74 0.72 0.69 0.71  CALCIUM 7.6*  --    < > 7.9* 8.0* 8.1* 8.3* 8.2*  MG 2.2 2.3  --  2.3 1.8 1.6* 1.8 1.6*  PHOS 4.0 3.3  --  2.9 3.1 2.7  --   --    < > = values in this  interval not displayed.   GFR: Estimated Creatinine Clearance: 76.1 mL/min (by C-G formula based on SCr of 0.71 mg/dL). Liver Function Tests: No results for input(s): AST, ALT, ALKPHOS, BILITOT, PROT, ALBUMIN in the last 168 hours. No results for input(s): LIPASE, AMYLASE in the last 168 hours. No results for input(s): AMMONIA in the last 168 hours. Coagulation Profile: No results for input(s): INR, PROTIME in the last 168 hours. Cardiac Enzymes: No results for input(s): CKTOTAL, CKMB, CKMBINDEX, TROPONINI in the last 168 hours. BNP (last 3 results) Recent Labs    08/17/17 1038 09/14/17 0921  PROBNP 612 503   HbA1C: No results for input(s): HGBA1C in the last 72 hours. CBG: No results for input(s): GLUCAP in the last 168 hours. Lipid Profile: No results for  input(s): CHOL, HDL, LDLCALC, TRIG, CHOLHDL, LDLDIRECT in the last 72 hours. Thyroid Function Tests: No results for input(s): TSH, T4TOTAL, FREET4, T3FREE, THYROIDAB in the last 72 hours. Anemia Panel: No results for input(s): VITAMINB12, FOLATE, FERRITIN, TIBC, IRON, RETICCTPCT in the last 72 hours. Urine analysis:    Component Value Date/Time   COLORURINE AMBER (A) 03/15/2018 1108   APPEARANCEUR CLOUDY (A) 03/15/2018 1108   LABSPEC 1.020 03/15/2018 1108   PHURINE 5.0 03/15/2018 1108   GLUCOSEU NEGATIVE 03/15/2018 1108   HGBUR LARGE (A) 03/15/2018 1108   BILIRUBINUR NEGATIVE 03/15/2018 1108   KETONESUR NEGATIVE 03/15/2018 1108   PROTEINUR 100 (A) 03/15/2018 1108   UROBILINOGEN 1.0 03/30/2011 0901   NITRITE NEGATIVE 03/15/2018 1108   LEUKOCYTESUR SMALL (A) 03/15/2018 1108     Elta Angell M.D. [facility] 03/23/2018, 2:23 PM  Pager: [phone] Between 7am to 7pm - call Pager - [phone]  After 7pm go to [email or web address] - password [email or web address]  Call night coverage person covering after 7pm

## 2018-03-24 ENCOUNTER — Telehealth: Payer: Self-pay | Admitting: Vascular Surgery

## 2018-03-24 DIAGNOSIS — R0600 Dyspnea, unspecified: Secondary | ICD-10-CM

## 2018-03-24 LAB — HEMOGLOBIN AND HEMATOCRIT, BLOOD
HCT: 29.2 % — ABNORMAL LOW (ref 36.0–46.0)
Hemoglobin: 8.8 g/dL — ABNORMAL LOW (ref 12.0–15.0)

## 2018-03-24 LAB — BASIC METABOLIC PANEL
Anion gap: 9 (ref 5–15)
BUN: 16 mg/dL (ref 6–20)
CO2: 30 mmol/L (ref 22–32)
Calcium: 8.1 mg/dL — ABNORMAL LOW (ref 8.9–10.3)
Chloride: 101 mmol/L (ref 101–111)
Creatinine, Ser: 0.76 mg/dL (ref 0.44–1.00)
GFR calc Af Amer: >60 mL/min (ref >60)
GFR calc non Af Amer: >60 mL/min (ref >60)
Glucose, Bld: 104 mg/dL — ABNORMAL HIGH (ref 65–99)
Potassium: 3.8 mmol/L (ref 3.5–5.1)
Sodium: 140 mmol/L (ref 135–145)

## 2018-03-24 LAB — CBC
HCT: 25.2 % — ABNORMAL LOW (ref 36.0–46.0)
Hemoglobin: 7.8 g/dL — ABNORMAL LOW (ref 12.0–15.0)
MCH: 29 pg (ref 26.0–34.0)
MCHC: 31 g/dL (ref 30.0–36.0)
MCV: 93.7 fL (ref 78.0–100.0)
Platelets: 192 10*3/uL (ref 150–400)
RBC: 2.69 MIL/uL — ABNORMAL LOW (ref 3.87–5.11)
RDW: 18 % — ABNORMAL HIGH (ref 11.5–15.5)
WBC: 10.1 10*3/uL (ref 4.0–10.5)

## 2018-03-24 LAB — MAGNESIUM: Magnesium: 1.4 mg/dL — ABNORMAL LOW (ref 1.7–2.4)

## 2018-03-24 NOTE — Progress Notes (Signed)
PROGRESS NOTE    [name]  JIR:[id number] DOB: [birth year] DOA: 02/28/2018 PCP: [name], MD   Brief Narrative:  HPI on 03/01/2018 by Dr. Jennette Kettle [name] is a 77 y.o. female with medical history significant of CAD s/p CABG 7510, diastolic CHF with EF 25%, COPD currently on PRN O2 at home, prior DVT not currently anticoagulated due to GIB last year.  Patient presents to the ED with c/o 2-3 weeks of nausea, generalized weakness, SOB on exertion, needing to use more O2 at home on exertion than baseline.  Some substernal non-radiating CP.  CP non exertional.  Interim history Admitted on 3/10 with nausea, weakness and dyspnea on exertion, acute respiratory failure with hypoxia.  Workup showed multiple subacute pulmonary embolus on CT scan as well as large acute DVT in the left femoral vein.  She was incidentally found to have severe stenosis of superior mesenteric artery.  Patient was started on heparin drip, admitted to hospitalist service, had stenting of SMA on 3/18.  Since the stent placement, she had worsening abdominal pain, nausea and vomiting.  CTA abdomen and pelvis on 3/19 revealed patent SMA stent with moderate abdominopelvic ascites, colonic wall thickening.  GI was consulted, symptoms thought to be from ischemic colitis.  However hemoglobin continued to drop and on 3/24 AM, patient had a rapid response with hypotension, acute hypoxic respiratory failure, severe abdominal pain.  Repeat CTabdomen pelvis on 3/24 showed a new large anterior abdominal wall hematoma measuring 16.9 x 11.9  X 12.4 cm.  Hemoglobin 6.6.  Heparin drip, Plavix were discontinued, patient was transfused 2 units packed RBC and transferred to ICU.  She was placed on vasopressors per IVC filter placed on 3/24.  Patient's creatinine function continued to trend up, renal ultrasound did not show any obstruction or hydronephrosis, possibly due to hypoperfusion/ATN.  For the new onset ascites, patient underwent  paracentesis on 3/20 which showed SAAG >1.1 Vascular surgery following patient, recommended starting aspirin once hemoglobin has remained stable.  Assessment & Plan   Right-sided ischemic colitis -Superior mesenteric artery stenosis status post stent placement to SMA on 03/08/2018 -Nausea, vomiting, abdominal pain improved.  Patient currently tolerating solids. -Neurosurgery consulted and appreciated, continue aspirin 81 mg daily, with outpatient follow-up with Dr. [name] -Antibiotics were completed on 03/17/2018  Acute hypoxic respiratory failure with pulmonary emboli/left femoral DVT/COPD -Appears to have improved -Status post IVC filter placement on 03/14/2018 -Has been given IV Lasix to aid her respiratory status, currently on oral Lasix 40 mg twice daily as well as fluid restriction of 1500 cc/day -Continue supplemental oxygen as well as duo nebs as needed  Acute on chronic blood loss anemia/hemorrhagic shock/rectus sheath hematoma/abdominal pain -Hemoglobin currently 8.8 and currently stable -She did require vasopressors briefly due to shock.  BP currently stable  Acute kidney injury with non-anion gap metabolic acidosis -Suspect secondary to shock/hypoperfusion, ATN and contrast -Renal ultrasound unremarkable -Baseline creatinine 0.7-1, peaked at 3.2 -Currently creatinine 0.76 -Need to monitor BMP closely given the patient is on Lasix  New ascites  -status post paracentesis on 03/10/2018 -SAAG >1.1, felt to be inflammatory.  CT scan was cirrhosis, ischemic colitis -Gastroenterology was consulted and signed off  Urinary retention -Resolved, patient did need Foley catheter during this hospitalization however this has been discontinued -Continue to monitor  Hypomagnesemia -Replaced  DVT Prophylaxis  SCDs  Code Status: Partial, No CPR  Family Communication: None at bedside  Disposition Plan: Admitted, pending SNF  Consultants PCCM Vascular surgery  Interventional  radiology Gastroenterology  Procedures  3/18 SMA stenosis status post stent CT ABD /Pelvis 3/19>Interval development of moderate abdominopelvic ascites,Colonic wall thickening involving the cecum, ascending, proximal transverse colon 3/20 status post paracentesis with 100 cc removed CT abd /pelvis 3/24>>New, large anterior abdominal wall hematoma measuring 16.9 x 11.9x 12.4 cm.2. Small volume ascites with nodular appearance of the liver concerning for cirrhosis.. Medium-sized right pleural effusion 3/24: Retrievable IVC filter placed  Antibiotics   Anti-infectives (From admission, onward)   Start     Dose/Rate Route Frequency Ordered Stop   03/16/18 0600  ciprofloxacin (CIPRO) IVPB 200 mg  Status:  Discontinued     200 mg 100 mL/hr over 60 Minutes Intravenous Every 24 hours 03/15/18 0933 03/17/18 1223   03/15/18 0600  ciprofloxacin (CIPRO) IVPB 400 mg  Status:  Discontinued     400 mg 200 mL/hr over 60 Minutes Intravenous Every 24 hours 03/14/18 1259 03/15/18 0933   03/13/18 1800  ciprofloxacin (CIPRO) IVPB 400 mg  Status:  Discontinued     400 mg 200 mL/hr over 60 Minutes Intravenous Every 12 hours 03/13/18 1711 03/14/18 1259   03/13/18 1800  metroNIDAZOLE (FLAGYL) IVPB 500 mg  Status:  Discontinued     500 mg 100 mL/hr over 60 Minutes Intravenous Every 8 hours 03/13/18 1711 03/16/18 1100   03/10/18 1230  metroNIDAZOLE (FLAGYL) IVPB 500 mg  Status:  Discontinued     500 mg 100 mL/hr over 60 Minutes Intravenous Every 8 hours 03/10/18 1208 03/10/18 1815   03/10/18 1230  ciprofloxacin (CIPRO) IVPB 400 mg  Status:  Discontinued     400 mg 200 mL/hr over 60 Minutes Intravenous Every 12 hours 03/10/18 1208 03/10/18 1815      Subjective:   [name] seen and examined today. Currently has no complaints.  Denies chest pain, shortness breath, abdominal pain, nausea vomiting, diarrhea or constipation.  Objective:   Vitals:   03/23/18 2038 03/23/18 2100 03/24/18 0611 03/24/18 1512    BP: (!) 109/57  (!) 100/55 113/68  Pulse: 86  (!) 101 97  Resp: 18  19 16   Temp:  99.7 F (37.6 C) 99.2 F (37.3 C)   TempSrc:  Oral Oral   SpO2: 96%  100% 100%  Weight:   102.7 kg (226 lb 6.6 oz)   Height:        Intake/Output Summary (Last 24 hours) at 03/24/2018 1524 Last data filed at 03/24/2018 0919 Gross per 24 hour  Intake 360 ml  Output -  Net 360 ml   Filed Weights   03/22/18 0427 03/23/18 0600 03/24/18 0611  Weight: 99.3 kg (218 lb 14.7 oz) 109 kg (240 lb 4.8 oz) 102.7 kg (226 lb 6.6 oz)    Exam  General: Well developed, well nourished, NAD, appears stated age  HEENT: NCAT, mucous membranes moist.   Neck: Supple  Cardiovascular: S1 S2 auscultated, no rubs, murmurs or gallops. irregular  Respiratory: Clear to auscultation bilaterally with equal chest rise  Abdomen: Soft, nontender, nondistended, + bowel sounds  Extremities: warm dry without cyanosis clubbing. LE edema.  Neuro: AAOx3, nonfocal  Psych: Normal affect and demeanor with intact judgement and insight   Data Reviewed: I have personally reviewed following labs and imaging studies  CBC: Recent Labs  Lab 03/18/18 0318  03/20/18 0332 03/21/18 0324 03/22/18 0653 03/23/18 0407 03/24/18 0342 03/24/18 1008  WBC 15.6*   < > 12.6* 11.7* 12.7* 12.8* 10.1  --   NEUTROABS 12.9*  --   --   --   --   --   --   --  HGB 7.9*   < > 8.4* 8.2* 8.4* 8.5* 7.8* 8.8*  HCT 24.2*   < > 26.7* 26.3* 27.5* 27.6* 25.2* 29.2*  MCV 89.3   < > 92.7 93.6 93.9 94.2 93.7  --   PLT 120*   < > 145* 154 190 201 192  --    < > = values in this interval not displayed.   Basic Metabolic Panel: Recent Labs  Lab 03/18/18 0318  03/19/18 0300 03/20/18 0714 03/21/18 0324 03/22/18 0653 03/23/18 0407 03/24/18 0342  NA  --    < > 136 139 138 138 139 140  K  --    < > 3.7 3.7 3.7 4.0 3.9 3.8  CL  --    < > 108 107 107 106 104 101  CO2  --    < > 20* 24 25 25 25 30   GLUCOSE  --    < > 107* 100* 126* 119* 101* 104*  BUN   --    < > 35* 25* 21* 18 17 16   CREATININE  --    < > 0.99 0.74 0.72 0.69 0.71 0.76  CALCIUM  --    < > 7.9* 8.0* 8.1* 8.3* 8.2* 8.1*  MG 2.3  --  2.3 1.8 1.6* 1.8 1.6* 1.4*  PHOS 3.3  --  2.9 3.1 2.7  --   --   --    < > = values in this interval not displayed.   GFR: Estimated Creatinine Clearance: 73.8 mL/min (by C-G formula based on SCr of 0.76 mg/dL). Liver Function Tests: No results for input(s): AST, ALT, ALKPHOS, BILITOT, PROT, ALBUMIN in the last 168 hours. No results for input(s): LIPASE, AMYLASE in the last 168 hours. No results for input(s): AMMONIA in the last 168 hours. Coagulation Profile: No results for input(s): INR, PROTIME in the last 168 hours. Cardiac Enzymes: No results for input(s): CKTOTAL, CKMB, CKMBINDEX, TROPONINI in the last 168 hours. BNP (last 3 results) Recent Labs    08/17/17 1038 09/14/17 0921  PROBNP 612 503   HbA1C: No results for input(s): HGBA1C in the last 72 hours. CBG: No results for input(s): GLUCAP in the last 168 hours. Lipid Profile: No results for input(s): CHOL, HDL, LDLCALC, TRIG, CHOLHDL, LDLDIRECT in the last 72 hours. Thyroid Function Tests: No results for input(s): TSH, T4TOTAL, FREET4, T3FREE, THYROIDAB in the last 72 hours. Anemia Panel: No results for input(s): VITAMINB12, FOLATE, FERRITIN, TIBC, IRON, RETICCTPCT in the last 72 hours. Urine analysis:    Component Value Date/Time   COLORURINE AMBER (A) 03/15/2018 1108   APPEARANCEUR CLOUDY (A) 03/15/2018 1108   LABSPEC 1.020 03/15/2018 1108   PHURINE 5.0 03/15/2018 1108   GLUCOSEU NEGATIVE 03/15/2018 1108   HGBUR LARGE (A) 03/15/2018 1108   BILIRUBINUR NEGATIVE 03/15/2018 1108   KETONESUR NEGATIVE 03/15/2018 1108   PROTEINUR 100 (A) 03/15/2018 1108   UROBILINOGEN 1.0 03/30/2011 0901   NITRITE NEGATIVE 03/15/2018 1108   LEUKOCYTESUR SMALL (A) 03/15/2018 1108   Sepsis Labs: @LABRCNTIP (procalcitonin:4,lacticidven:4)  )No results found for this or any previous visit  (from the past 240 hour(s)).    Radiology Studies: No results found.   Scheduled Meds: . aspirin EC  81 mg Oral Daily  . chlorhexidine  15 mL Mouth Rinse BID  . cholecalciferol  1,000 Units Oral Daily  . citalopram  30 mg Oral Daily  . docusate sodium  100 mg Oral BID  . feeding supplement (ENSURE ENLIVE)  237 mL Oral TID BM  .  ferrous sulfate  325 mg Oral Q breakfast  . furosemide  40 mg Oral BID  . lidocaine  1 patch Transdermal Q24H  . magnesium oxide  400 mg Oral BID  . mouth rinse  15 mL Mouth Rinse q12n4p  . pantoprazole  40 mg Oral Daily  . pilocarpine  1 drop Both Eyes BID  . polyethylene glycol  17 g Oral Daily  . pramipexole  0.25 mg Oral QHS  . senna  1 tablet Oral BID  . sodium chloride flush  3 mL Intravenous Q12H  . sodium chloride flush  3 mL Intravenous Q12H   Continuous Infusions: . sodium chloride       LOS: 23 days   Time Spent in minutes   30 minutes  Kweli Grassel D.O. on 03/24/2018 at 3:24 PM  Between 7am to 7pm - Pager - [phone]  After 7pm go to [email or web address] - password [email or web address]  And look for the night coverage person covering for me after hours  [facility] Office  [phone]

## 2018-03-24 NOTE — Telephone Encounter (Signed)
-----   Message from Mena Goes, RN sent at 03/22/2018  3:20 PM EDT ----- Regarding: 2 different patient messages on this  Second patient is for Beverly Milch  MRN [id number]  ----- Message ----- From: Elam Dutch, MD Sent: 03/22/2018   2:44 PM To: [facility] Charge Pool  Pt needs follow up with me and mesenteric duplex in 1 month  She can come off the list  Abner Greenspan also needs follow up in a month.  She can also come off the list.  Can see PA needs ABIs at that appt   Ruta Hinds

## 2018-03-24 NOTE — Progress Notes (Signed)
CSW continuing to follow for discharge planning. Awaiting [employer] determination for admit to [facility].  Estanislado Emms, [place]

## 2018-03-24 NOTE — Care Management Important Message (Signed)
Important Message  Patient Details  Name: [name] MRN: [id number] Date of Birth: 08-19-1941   Medicare Important Message Given:  Yes    Barb Merino Hepler 03/24/2018, 1:57 PM

## 2018-03-24 NOTE — Progress Notes (Signed)
Nutrition Follow-up  DOCUMENTATION CODES:   Not applicable  INTERVENTION:    Continue Ensure Enlive po TID, each supplement provides 350 kcal and 20 grams of protein  NUTRITION DIAGNOSIS:   Increased nutrient needs related to chronic illness as evidenced by estimated needs.  Ongoing  GOAL:   Patient will meet greater than or equal to 90% of their needs  Unmet  MONITOR:   PO intake, Supplement acceptance, Labs, Weight trends, Skin, I & O's  ASSESSMENT:   [name] is a 77 y.o. female with medical history significant of CAD s/p CABG 8264, diastolic CHF with EF 15%, COPD currently on PRN O2 at home, prior DVT not currently anticoagulated due to GIB last year.  Patient presents to the ED with c/o 2-3 weeks of nausea, generalized weakness, SOB on exertion, needing to use more O2 at home on exertion than baseline.  Diet has been advanced to soft diet. Patient is consuming 25-100% of meals. Also drinking Ensure supplements 1-2 times daily. Appetite is improving. Labs reviewed. Medications reviewed and include vitamin D, Colace, ferrous sulfate, Lasix, Mag-Ox, and Miralax.  Diet Order:  DIET SOFT Room service appropriate? Yes; Fluid consistency: Thin; Fluid restriction: 1500 mL Fluid  EDUCATION NEEDS:   Education needs have been addressed  Skin:  Skin Assessment: Reviewed RN Assessment  Last BM:  3/23  Height:   Ht Readings from Last 1 Encounters:  03/01/18 5\' 8"  (1.727 m)    Weight:   Wt Readings from Last 1 Encounters:  03/24/18 226 lb 6.6 oz (102.7 kg)    Ideal Body Weight:  63.6 kg  BMI:  Body mass index is 34.43 kg/m.  Estimated Nutritional Needs:   Kcal:  1700-1900  Protein:  85-100 grams  Fluid:  1.7-1.9 L    Molli Barrows, RD, LDN, CNSC Pager [phone] After Hours Pager [phone]

## 2018-03-24 NOTE — Telephone Encounter (Signed)
LVM for pts appt 5/2 [place]  & OV  Mld lttr

## 2018-03-24 NOTE — Progress Notes (Signed)
Physical Therapy Treatment Patient Details Name: [name] MRN: [id number] DOB: 10-18-1941 Today's Date: 03/24/2018    History of Present Illness 77 year old female with past medical history of R THA, B TKA, B TSA, back surgery, neck surgery, CAD, PVD and COPD who is had worsening left hip issues that have gotten to the point where she does not ambulate much over the past month and presented to the emergency room on 3/10 with complaints of nausea, weakness and dyspnea on exertion and found to be in acute respiratory failure with hypoxia. Workup discovered multiplesubacute pulmonary embolus on CT scan. 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  During hospitalization patient is now s/p abdominal angiogram and parcentesis on 3/20.  GI was consulted, symptoms thought to be from ischemic colitis.  However hemoglobin continued to drop and on 3/24 AM, patient had a rapid response with hypotension, acute hypoxic respiratory failure, severe abdominal pain.  Repeat CTabdomen pelvis on 3/24 showed a new large anterior abdominal wall hematoma measuring 16.9 x 11.9  X 12.4 cm.  Hemoglobin 6.6.  Heparin drip, Plavix were discontinued, patient was transfused 2 units packed RBC and transferred to ICU.  IVC filter placed on 3/24.  Patient's creatinine function continued to trend up, renal ultrasound did not show any obstruction or hydronephrosis, possibly due to hypoperfusion/ATN.     PT Comments    Pt slow to progress, but is anxious to participate.  Today limited by L leg pain and fatigue to ROM exercise, transitions to EOB and transfers to the chair   Follow Up Recommendations  Supervision/Assistance - 24 hour;SNF     Equipment Recommendations  None recommended by PT    Recommendations for Other Services       Precautions / Restrictions Precautions Precautions: Fall    Mobility  Bed Mobility Overal bed mobility: Needs Assistance Bed Mobility: Rolling;Sidelying  to Sit Rolling: Min assist;+2 for safety/equipment Sidelying to sit: Mod assist;+2 for safety/equipment       General bed mobility comments: cues for sequencing and direction assist with L LE due to hip pain.  Transfers Overall transfer level: Needs assistance Equipment used: Rolling walker (2 wheeled) Transfers: Sit to/from [facility] Sit to Stand: Mod assist Stand pivot transfers: Mod assist       General transfer comment: pt needing significant stability assist.  Notable that pt's left knee weak and beginning to buckle  Ambulation/Gait             General Gait Details: unable   Stairs            Wheelchair Mobility    Modified Rankin (Stroke Patients Only)       Balance Overall balance assessment: Needs assistance   Sitting balance-Leahy Scale: Fair     Standing balance support: During functional activity;Bilateral upper extremity supported Standing balance-Leahy Scale: Poor Standing balance comment: relies on UE support for balance.                              Cognition Arousal/Alertness: Awake/alert Behavior During Therapy: WFL for tasks assessed/performed Overall Cognitive Status: Within Functional Limits for tasks assessed                                        Exercises      General Comments General comments (skin integrity,  edema, etc.): pt needed a significant amount of [facility] as she was incontinent of stool prior to arrival and urine during our session.  pt rolled with minimal assist in both directions and assisted further by holding her rolls      Pertinent Vitals/Pain Pain Assessment: Faces Faces Pain Scale: Hurts even more Pain Location: l hip/leg and buttock Pain Descriptors / Indicators: Sore;Grimacing;Guarding;Burning Pain Intervention(s): Monitored during session    Home Living                      Prior Function            PT Goals (current goals can now be  found in the care plan section) Acute Rehab PT Goals Patient Stated Goal: return to independence with mobility PT Goal Formulation: With patient/family Time For Goal Achievement: 04/02/18 Potential to Achieve Goals: Good Progress towards PT goals: Progressing toward goals    Frequency    Min 3X/week      PT Plan Current plan remains appropriate    Co-evaluation              AM-PAC PT "6 Clicks" Daily Activity  Outcome Measure  Difficulty turning over in bed (including adjusting bedclothes, sheets and blankets)?: Unable Difficulty moving from lying on back to sitting on the side of the bed? : Unable Difficulty sitting down on and standing up from a chair with arms (e.g., wheelchair, bedside commode, etc,.)?: Unable Help needed moving to and from a bed to chair (including a wheelchair)?: A Lot Help needed walking in hospital room?: A Lot Help needed climbing 3-5 steps with a railing? : Total 6 Click Score: 8    End of Session   Activity Tolerance: Patient tolerated treatment well;Patient limited by fatigue Patient left: with call bell/phone within reach;in chair Nurse Communication: Mobility status PT Visit Diagnosis: Difficulty in walking, not elsewhere classified (R26.2);Muscle weakness (generalized) (M62.81);Other abnormalities of gait and mobility (R26.89)     Time: 0321-2248 PT Time Calculation (min) (ACUTE ONLY): 47 min  Charges:  $Therapeutic Activity: 23-37 mins $Self Care/Home Management: 8-22                    G Codes:       04-08-2018  Donnella Sham, [place] [phone]  (pager)   Tessie Fass Dorse [place]ocy 2018/04/08, 5:12 PM

## 2018-03-25 LAB — BASIC METABOLIC PANEL
Anion gap: 10 (ref 5–15)
BUN: 16 mg/dL (ref 6–20)
CO2: 27 mmol/L (ref 22–32)
Calcium: 7.9 mg/dL — ABNORMAL LOW (ref 8.9–10.3)
Chloride: 101 mmol/L (ref 101–111)
Creatinine, Ser: 0.72 mg/dL (ref 0.44–1.00)
GFR calc Af Amer: >60 mL/min (ref >60)
GFR calc non Af Amer: >60 mL/min (ref >60)
Glucose, Bld: 126 mg/dL — ABNORMAL HIGH (ref 65–99)
Potassium: 3.6 mmol/L (ref 3.5–5.1)
Sodium: 138 mmol/L (ref 135–145)

## 2018-03-25 LAB — MAGNESIUM
Magnesium: 1.4 mg/dL — ABNORMAL LOW (ref 1.7–2.4)
Magnesium: 1.5 mg/dL — ABNORMAL LOW (ref 1.7–2.4)

## 2018-03-25 LAB — HEMOGLOBIN AND HEMATOCRIT, BLOOD
HCT: 25.9 % — ABNORMAL LOW (ref 36.0–46.0)
HCT: 24.7 % — ABNORMAL LOW (ref 36.0–46.0)
HCT: 27.1 % — ABNORMAL LOW (ref 36.0–46.0)
Hemoglobin: 7.9 g/dL — ABNORMAL LOW (ref 12.0–15.0)
Hemoglobin: 7.5 g/dL — ABNORMAL LOW (ref 12.0–15.0)
Hemoglobin: 8.2 g/dL — ABNORMAL LOW (ref 12.0–15.0)

## 2018-03-25 NOTE — Progress Notes (Signed)
  Pt had a run of trigemini around 1714 during pt care.  Pt was turning in bed and felt SOB.  MD made aware.  Idolina Primer, RN

## 2018-03-25 NOTE — Progress Notes (Signed)
PROGRESS NOTE    [name]  TMH:[id number] DOB: [birth year] DOA: 02/28/2018 PCP: [name], MD   Brief Narrative:  HPI on 03/01/2018 by Dr. Jennette Kettle [name] is a 77 y.o. female with medical history significant of CAD s/p CABG 9211, diastolic CHF with EF 94%, COPD currently on PRN O2 at home, prior DVT not currently anticoagulated due to GIB last year.  Patient presents to the ED with c/o 2-3 weeks of nausea, generalized weakness, SOB on exertion, needing to use more O2 at home on exertion than baseline.  Some substernal non-radiating CP.  CP non exertional.  Interim history Admitted on 3/10 with nausea, weakness and dyspnea on exertion, acute respiratory failure with hypoxia.  Workup showed multiple subacute pulmonary embolus on CT scan as well as large acute DVT in the left femoral vein.  She was incidentally found to have severe stenosis of superior mesenteric artery.  Patient was started on heparin drip, admitted to hospitalist service, had stenting of SMA on 3/18.  Since the stent placement, she had worsening abdominal pain, nausea and vomiting.  CTA abdomen and pelvis on 3/19 revealed patent SMA stent with moderate abdominopelvic ascites, colonic wall thickening.  GI was consulted, symptoms thought to be from ischemic colitis.  However hemoglobin continued to drop and on 3/24 AM, patient had a rapid response with hypotension, acute hypoxic respiratory failure, severe abdominal pain.  Repeat CTabdomen pelvis on 3/24 showed a new large anterior abdominal wall hematoma measuring 16.9 x 11.9  X 12.4 cm.  Hemoglobin 6.6.  Heparin drip, Plavix were discontinued, patient was transfused 2 units packed RBC and transferred to ICU.  She was placed on vasopressors per IVC filter placed on 3/24.  Patient's creatinine function continued to trend up, renal ultrasound did not show any obstruction or hydronephrosis, possibly due to hypoperfusion/ATN.  For the new onset ascites, patient underwent  paracentesis on 3/20 which showed SAAG >1.1 Vascular surgery following patient, recommended starting aspirin once hemoglobin has remained stable.  Assessment & Plan   Right-sided ischemic colitis -Superior mesenteric artery stenosis status post stent placement to SMA on 03/08/2018 -Nausea, vomiting, abdominal pain improved.  Patient currently tolerating solids. -Neurosurgery consulted and appreciated, continue aspirin 81 mg daily, with outpatient follow-up with Dr. [name] -Antibiotics were completed on 03/17/2018  Acute hypoxic respiratory failure with pulmonary emboli/left femoral DVT/COPD -Appears to have improved -Status post IVC filter placement on 03/14/2018 -Has been given IV Lasix to aid her respiratory status, currently on oral Lasix 40 mg twice daily as well as fluid restriction of 1500 cc/day -Continue supplemental oxygen as well as duo nebs as needed  Acute on chronic blood loss anemia/hemorrhagic shock/rectus sheath hematoma/abdominal pain -Hemoglobin 7.5 today -She did require vasopressors briefly due to shock.  BP currently stable -will continue to monitor CBC, if hemoglobin continues to drop, will transfuse  Acute kidney injury with non-anion gap metabolic acidosis -Suspect secondary to shock/hypoperfusion, ATN and contrast -Renal ultrasound unremarkable -Baseline creatinine 0.7-1, peaked at 3.2 -Currently creatinine 0.76 -Need to monitor BMP closely given the patient is on Lasix  New ascites  -status post paracentesis on 03/10/2018 -SAAG >1.1, felt to be inflammatory.  CT scan was cirrhosis, ischemic colitis -Gastroenterology was consulted and signed off  Urinary retention -Resolved, patient did need Foley catheter during this hospitalization however this has been discontinued -Continue to monitor  Hypomagnesemia -Replaced  DVT Prophylaxis  SCDs  Code Status: Partial, No CPR  Family Communication: None at bedside  Disposition  Plan: Admitted, pending  SNF/insurance approval. Continue to monitor hemoglobin   Consultants PCCM Vascular surgery Interventional radiology Gastroenterology  Procedures  3/18 SMA stenosis status post stent CT ABD /Pelvis 3/19>Interval development of moderate abdominopelvic ascites,Colonic wall thickening involving the cecum, ascending, proximal transverse colon 3/20 status post paracentesis with 100 cc removed CT abd /pelvis 3/24>>New, large anterior abdominal wall hematoma measuring 16.9 x 11.9x 12.4 cm.2. Small volume ascites with nodular appearance of the liver concerning for cirrhosis.. Medium-sized right pleural effusion 3/24: Retrievable IVC filter placed  Antibiotics   Anti-infectives (From admission, onward)   Start     Dose/Rate Route Frequency Ordered Stop   03/16/18 0600  ciprofloxacin (CIPRO) IVPB 200 mg  Status:  Discontinued     200 mg 100 mL/hr over 60 Minutes Intravenous Every 24 hours 03/15/18 0933 03/17/18 1223   03/15/18 0600  ciprofloxacin (CIPRO) IVPB 400 mg  Status:  Discontinued     400 mg 200 mL/hr over 60 Minutes Intravenous Every 24 hours 03/14/18 1259 03/15/18 0933   03/13/18 1800  ciprofloxacin (CIPRO) IVPB 400 mg  Status:  Discontinued     400 mg 200 mL/hr over 60 Minutes Intravenous Every 12 hours 03/13/18 1711 03/14/18 1259   03/13/18 1800  metroNIDAZOLE (FLAGYL) IVPB 500 mg  Status:  Discontinued     500 mg 100 mL/hr over 60 Minutes Intravenous Every 8 hours 03/13/18 1711 03/16/18 1100   03/10/18 1230  metroNIDAZOLE (FLAGYL) IVPB 500 mg  Status:  Discontinued     500 mg 100 mL/hr over 60 Minutes Intravenous Every 8 hours 03/10/18 1208 03/10/18 1815   03/10/18 1230  ciprofloxacin (CIPRO) IVPB 400 mg  Status:  Discontinued     400 mg 200 mL/hr over 60 Minutes Intravenous Every 12 hours 03/10/18 1208 03/10/18 1815      Subjective:   [name] seen and examined today. Currently feels "bad." Denies current chest pain, shortness of breath, abdominal pain, nausea,  vomiting, diarrhea. Patient states she has been feeling "bad for months.   Objective:   Vitals:   03/24/18 0611 03/24/18 1512 03/24/18 2113 03/25/18 0506  BP: (!) 100/55 113/68 (!) 113/53 (!) 118/58  Pulse: (!) 101 97 92 80  Resp: 19 16 18 16   Temp: 99.2 F (37.3 C)  100.1 F (37.8 C) 99.7 F (37.6 C)  TempSrc: Oral  Oral Oral  SpO2: 100% 100% 90% 100%  Weight: 102.7 kg (226 lb 6.6 oz)   99.1 kg (218 lb 8 oz)  Height:        Intake/Output Summary (Last 24 hours) at 03/25/2018 1404 Last data filed at 03/25/2018 1016 Gross per 24 hour  Intake 480 ml  Output 300 ml  Net 180 ml   Filed Weights   03/23/18 0600 03/24/18 0611 03/25/18 0506  Weight: 109 kg (240 lb 4.8 oz) 102.7 kg (226 lb 6.6 oz) 99.1 kg (218 lb 8 oz)   Exam  General: Well developed, well nourished, NAD, appears stated age  HEENT: NCAT, mucous membranes moist.   Neck: Supple  Cardiovascular: S1 S2 auscultated, Irregular, no murmur  Respiratory: Clear to auscultation bilaterally with equal chest rise  Abdomen: Soft, nontender, nondistended, + bowel sounds  Extremities: warm dry without cyanosis clubbing. LE edema.   Neuro: AAOx3, nonfocal  Psych: Normal affect and demeanor  Data Reviewed: I have personally reviewed following labs and imaging studies  CBC: Recent Labs  Lab 03/20/18 0332 03/21/18 4401 03/22/18 0272 03/23/18 0407 03/24/18 0342 03/24/18 1008 03/25/18 5366  03/25/18 1036  WBC 12.6* 11.7* 12.7* 12.8* 10.1  --   --   --   HGB 8.4* 8.2* 8.4* 8.5* 7.8* 8.8* 7.9* 7.5*  HCT 26.7* 26.3* 27.5* 27.6* 25.2* 29.2* 25.9* 24.7*  MCV 92.7 93.6 93.9 94.2 93.7  --   --   --   PLT 145* 154 190 201 192  --   --   --    Basic Metabolic Panel: Recent Labs  Lab 03/19/18 0300 03/20/18 0714 03/21/18 0324 03/22/18 0653 03/23/18 0407 03/24/18 0342 03/25/18 0349  NA 136 139 138 138 139 140  --   K 3.7 3.7 3.7 4.0 3.9 3.8  --   CL 108 107 107 106 104 101  --   CO2 20* 24 25 25 25 30   --   GLUCOSE  107* 100* 126* 119* 101* 104*  --   BUN 35* 25* 21* 18 17 16   --   CREATININE 0.99 0.74 0.72 0.69 0.71 0.76  --   CALCIUM 7.9* 8.0* 8.1* 8.3* 8.2* 8.1*  --   MG 2.3 1.8 1.6* 1.8 1.6* 1.4* 1.4*  PHOS 2.9 3.1 2.7  --   --   --   --    GFR: Estimated Creatinine Clearance: 72.5 mL/min (by C-G formula based on SCr of 0.76 mg/dL). Liver Function Tests: No results for input(s): AST, ALT, ALKPHOS, BILITOT, PROT, ALBUMIN in the last 168 hours. No results for input(s): LIPASE, AMYLASE in the last 168 hours. No results for input(s): AMMONIA in the last 168 hours. Coagulation Profile: No results for input(s): INR, PROTIME in the last 168 hours. Cardiac Enzymes: No results for input(s): CKTOTAL, CKMB, CKMBINDEX, TROPONINI in the last 168 hours. BNP (last 3 results) Recent Labs    08/17/17 1038 09/14/17 0921  PROBNP 612 503   HbA1C: No results for input(s): HGBA1C in the last 72 hours. CBG: No results for input(s): GLUCAP in the last 168 hours. Lipid Profile: No results for input(s): CHOL, HDL, LDLCALC, TRIG, CHOLHDL, LDLDIRECT in the last 72 hours. Thyroid Function Tests: No results for input(s): TSH, T4TOTAL, FREET4, T3FREE, THYROIDAB in the last 72 hours. Anemia Panel: No results for input(s): VITAMINB12, FOLATE, FERRITIN, TIBC, IRON, RETICCTPCT in the last 72 hours. Urine analysis:    Component Value Date/Time   COLORURINE AMBER (A) 03/15/2018 1108   APPEARANCEUR CLOUDY (A) 03/15/2018 1108   LABSPEC 1.020 03/15/2018 1108   PHURINE 5.0 03/15/2018 1108   GLUCOSEU NEGATIVE 03/15/2018 1108   HGBUR LARGE (A) 03/15/2018 1108   BILIRUBINUR NEGATIVE 03/15/2018 1108   KETONESUR NEGATIVE 03/15/2018 1108   PROTEINUR 100 (A) 03/15/2018 1108   UROBILINOGEN 1.0 03/30/2011 0901   NITRITE NEGATIVE 03/15/2018 1108   LEUKOCYTESUR SMALL (A) 03/15/2018 1108   Sepsis Labs: @LABRCNTIP (procalcitonin:4,lacticidven:4)  )No results found for this or any previous visit (from the past 240 hour(s)).     Radiology Studies: No results found.   Scheduled Meds: . aspirin EC  81 mg Oral Daily  . chlorhexidine  15 mL Mouth Rinse BID  . cholecalciferol  1,000 Units Oral Daily  . citalopram  30 mg Oral Daily  . docusate sodium  100 mg Oral BID  . feeding supplement (ENSURE ENLIVE)  237 mL Oral TID BM  . ferrous sulfate  325 mg Oral Q breakfast  . furosemide  40 mg Oral BID  . lidocaine  1 patch Transdermal Q24H  . magnesium oxide  400 mg Oral BID  . mouth rinse  15 mL Mouth Rinse  [id number]  . pantoprazole  40 mg Oral Daily  . pilocarpine  1 drop Both Eyes BID  . polyethylene glycol  17 g Oral Daily  . pramipexole  0.25 mg Oral QHS  . senna  1 tablet Oral BID  . sodium chloride flush  3 mL Intravenous Q12H  . sodium chloride flush  3 mL Intravenous Q12H   Continuous Infusions: . sodium chloride       LOS: 24 days   Time Spent in minutes   30 minutes  Natilie Krabbenhoft D.O. on 03/25/2018 at 2:04 PM  Between 7am to 7pm - Pager - [phone]  After 7pm go to [email or web address] - password [email or web address]  And look for the night coverage person covering for me after hours  [facility] Office  [phone]

## 2018-03-26 DIAGNOSIS — Z951 Presence of aortocoronary bypass graft: Secondary | ICD-10-CM | POA: Diagnosis not present

## 2018-03-26 DIAGNOSIS — R488 Other symbolic dysfunctions: Secondary | ICD-10-CM | POA: Diagnosis not present

## 2018-03-26 DIAGNOSIS — I2699 Other pulmonary embolism without acute cor pulmonale: Secondary | ICD-10-CM | POA: Diagnosis not present

## 2018-03-26 DIAGNOSIS — R531 Weakness: Secondary | ICD-10-CM | POA: Diagnosis not present

## 2018-03-26 DIAGNOSIS — I82412 Acute embolism and thrombosis of left femoral vein: Secondary | ICD-10-CM | POA: Diagnosis not present

## 2018-03-26 DIAGNOSIS — M545 Low back pain: Secondary | ICD-10-CM | POA: Diagnosis not present

## 2018-03-26 DIAGNOSIS — Z9981 Dependence on supplemental oxygen: Secondary | ICD-10-CM | POA: Diagnosis not present

## 2018-03-26 DIAGNOSIS — K559 Vascular disorder of intestine, unspecified: Secondary | ICD-10-CM | POA: Diagnosis not present

## 2018-03-26 DIAGNOSIS — J449 Chronic obstructive pulmonary disease, unspecified: Secondary | ICD-10-CM | POA: Diagnosis not present

## 2018-03-26 DIAGNOSIS — M1990 Unspecified osteoarthritis, unspecified site: Secondary | ICD-10-CM | POA: Diagnosis not present

## 2018-03-26 DIAGNOSIS — Z96641 Presence of right artificial hip joint: Secondary | ICD-10-CM | POA: Diagnosis present

## 2018-03-26 DIAGNOSIS — R310 Gross hematuria: Secondary | ICD-10-CM | POA: Diagnosis not present

## 2018-03-26 DIAGNOSIS — I5043 Acute on chronic combined systolic (congestive) and diastolic (congestive) heart failure: Secondary | ICD-10-CM | POA: Diagnosis not present

## 2018-03-26 DIAGNOSIS — R935 Abnormal findings on diagnostic imaging of other abdominal regions, including retroperitoneum: Secondary | ICD-10-CM | POA: Diagnosis not present

## 2018-03-26 DIAGNOSIS — Z87891 Personal history of nicotine dependence: Secondary | ICD-10-CM | POA: Diagnosis not present

## 2018-03-26 DIAGNOSIS — R0602 Shortness of breath: Secondary | ICD-10-CM | POA: Diagnosis not present

## 2018-03-26 DIAGNOSIS — Z6833 Body mass index (BMI) 33.0-33.9, adult: Secondary | ICD-10-CM | POA: Diagnosis not present

## 2018-03-26 DIAGNOSIS — I2510 Atherosclerotic heart disease of native coronary artery without angina pectoris: Secondary | ICD-10-CM | POA: Diagnosis not present

## 2018-03-26 DIAGNOSIS — N179 Acute kidney failure, unspecified: Secondary | ICD-10-CM | POA: Diagnosis not present

## 2018-03-26 DIAGNOSIS — M6281 Muscle weakness (generalized): Secondary | ICD-10-CM | POA: Diagnosis not present

## 2018-03-26 DIAGNOSIS — G8929 Other chronic pain: Secondary | ICD-10-CM | POA: Diagnosis not present

## 2018-03-26 DIAGNOSIS — R0603 Acute respiratory distress: Secondary | ICD-10-CM | POA: Diagnosis not present

## 2018-03-26 DIAGNOSIS — Z86711 Personal history of pulmonary embolism: Secondary | ICD-10-CM | POA: Diagnosis not present

## 2018-03-26 DIAGNOSIS — R319 Hematuria, unspecified: Secondary | ICD-10-CM | POA: Diagnosis not present

## 2018-03-26 DIAGNOSIS — Z7982 Long term (current) use of aspirin: Secondary | ICD-10-CM | POA: Diagnosis not present

## 2018-03-26 DIAGNOSIS — Z86718 Personal history of other venous thrombosis and embolism: Secondary | ICD-10-CM | POA: Diagnosis not present

## 2018-03-26 DIAGNOSIS — Z48812 Encounter for surgical aftercare following surgery on the circulatory system: Secondary | ICD-10-CM | POA: Diagnosis not present

## 2018-03-26 DIAGNOSIS — E785 Hyperlipidemia, unspecified: Secondary | ICD-10-CM | POA: Diagnosis present

## 2018-03-26 DIAGNOSIS — E778 Other disorders of glycoprotein metabolism: Secondary | ICD-10-CM | POA: Diagnosis present

## 2018-03-26 DIAGNOSIS — R188 Other ascites: Secondary | ICD-10-CM | POA: Diagnosis not present

## 2018-03-26 DIAGNOSIS — K529 Noninfective gastroenteritis and colitis, unspecified: Secondary | ICD-10-CM | POA: Diagnosis not present

## 2018-03-26 DIAGNOSIS — J80 Acute respiratory distress syndrome: Secondary | ICD-10-CM | POA: Diagnosis not present

## 2018-03-26 DIAGNOSIS — I5032 Chronic diastolic (congestive) heart failure: Secondary | ICD-10-CM | POA: Diagnosis not present

## 2018-03-26 DIAGNOSIS — E039 Hypothyroidism, unspecified: Secondary | ICD-10-CM | POA: Diagnosis present

## 2018-03-26 DIAGNOSIS — R278 Other lack of coordination: Secondary | ICD-10-CM | POA: Diagnosis not present

## 2018-03-26 DIAGNOSIS — R079 Chest pain, unspecified: Secondary | ICD-10-CM | POA: Diagnosis not present

## 2018-03-26 DIAGNOSIS — N12 Tubulo-interstitial nephritis, not specified as acute or chronic: Secondary | ICD-10-CM | POA: Diagnosis not present

## 2018-03-26 DIAGNOSIS — K551 Chronic vascular disorders of intestine: Secondary | ICD-10-CM | POA: Diagnosis not present

## 2018-03-26 DIAGNOSIS — R0600 Dyspnea, unspecified: Secondary | ICD-10-CM | POA: Diagnosis not present

## 2018-03-26 DIAGNOSIS — J9621 Acute and chronic respiratory failure with hypoxia: Secondary | ICD-10-CM | POA: Diagnosis not present

## 2018-03-26 DIAGNOSIS — J9811 Atelectasis: Secondary | ICD-10-CM | POA: Diagnosis not present

## 2018-03-26 DIAGNOSIS — G4733 Obstructive sleep apnea (adult) (pediatric): Secondary | ICD-10-CM | POA: Diagnosis present

## 2018-03-26 DIAGNOSIS — D649 Anemia, unspecified: Secondary | ICD-10-CM | POA: Diagnosis not present

## 2018-03-26 DIAGNOSIS — I509 Heart failure, unspecified: Secondary | ICD-10-CM | POA: Diagnosis not present

## 2018-03-26 DIAGNOSIS — E669 Obesity, unspecified: Secondary | ICD-10-CM | POA: Diagnosis present

## 2018-03-26 DIAGNOSIS — K219 Gastro-esophageal reflux disease without esophagitis: Secondary | ICD-10-CM | POA: Diagnosis not present

## 2018-03-26 DIAGNOSIS — N390 Urinary tract infection, site not specified: Secondary | ICD-10-CM | POA: Diagnosis not present

## 2018-03-26 DIAGNOSIS — I10 Essential (primary) hypertension: Secondary | ICD-10-CM | POA: Diagnosis not present

## 2018-03-26 DIAGNOSIS — R072 Precordial pain: Secondary | ICD-10-CM | POA: Diagnosis not present

## 2018-03-26 DIAGNOSIS — I739 Peripheral vascular disease, unspecified: Secondary | ICD-10-CM | POA: Diagnosis present

## 2018-03-26 LAB — BASIC METABOLIC PANEL
Anion gap: 7 (ref 5–15)
BUN: 16 mg/dL (ref 6–20)
CO2: 30 mmol/L (ref 22–32)
Calcium: 7.9 mg/dL — ABNORMAL LOW (ref 8.9–10.3)
Chloride: 102 mmol/L (ref 101–111)
Creatinine, Ser: 0.7 mg/dL (ref 0.44–1.00)
GFR calc Af Amer: >60 mL/min (ref >60)
GFR calc non Af Amer: >60 mL/min (ref >60)
Glucose, Bld: 108 mg/dL — ABNORMAL HIGH (ref 65–99)
Potassium: 3.4 mmol/L — ABNORMAL LOW (ref 3.5–5.1)
Sodium: 139 mmol/L (ref 135–145)

## 2018-03-26 LAB — HEMOGLOBIN AND HEMATOCRIT, BLOOD
HCT: 27.7 % — ABNORMAL LOW (ref 36.0–46.0)
Hemoglobin: 8.2 g/dL — ABNORMAL LOW (ref 12.0–15.0)

## 2018-03-26 LAB — MAGNESIUM: Magnesium: 1.5 mg/dL — ABNORMAL LOW (ref 1.7–2.4)

## 2018-03-26 LAB — GLUCOSE, CAPILLARY
Glucose-Capillary: 121 mg/dL — ABNORMAL HIGH (ref 65–99)
Glucose-Capillary: 118 mg/dL — ABNORMAL HIGH (ref 65–99)

## 2018-03-26 MED ORDER — ENSURE ENLIVE PO LIQD: 237.0000 mL | Freq: Three times a day (TID) | ORAL | 12 refills | Status: DC

## 2018-03-26 MED ORDER — MAGNESIUM SULFATE 4 GM/100ML IV SOLN
4.0000 g | Freq: Once | INTRAVENOUS | Status: AC
  Administered 2018-03-26: 4 g via INTRAVENOUS
  Filled 2018-03-26: qty 100

## 2018-03-26 MED ORDER — OXYCODONE HCL 10 MG PO TABS: 10.0000 mg | ORAL_TABLET | Freq: Four times a day (QID) | ORAL | 0 refills | Status: DC | PRN

## 2018-03-26 MED ORDER — MAGNESIUM OXIDE 400 (241.3 MG) MG PO TABS: 400.0000 mg | ORAL_TABLET | Freq: Two times a day (BID) | ORAL | Status: DC

## 2018-03-26 MED ORDER — FUROSEMIDE 40 MG PO TABS: 40.0000 mg | ORAL_TABLET | Freq: Two times a day (BID) | ORAL | 0 refills | Status: DC

## 2018-03-26 MED ORDER — POTASSIUM CHLORIDE CRYS ER 20 MEQ PO TBCR
40.0000 meq | EXTENDED_RELEASE_TABLET | Freq: Once | ORAL | Status: AC
Start: 2018-03-26 — End: 2018-03-26
  Administered 2018-03-26: 40 meq via ORAL
  Filled 2018-03-26: qty 2

## 2018-03-26 NOTE — Clinical Social Work Placement (Signed)
   CLINICAL SOCIAL WORK PLACEMENT  NOTE  Date:  03/26/2018  Patient Details  Name: [name] MRN: [id number] Date of Birth: 29-Aug-1941  Clinical Social Work is seeking post-discharge placement for this patient at the [place] level of care (*CSW will initial, date and re-position this form in  chart as items are completed):  Yes   Patient/family provided with [facility] Work Department's list of facilities offering this level of care within the geographic area requested by the patient (or if unable, by the patient's family).  Yes   Patient/family informed of their freedom to choose among providers that offer the needed level of care, that participate in Medicare, Medicaid or managed care program needed by the patient, have an available bed and are willing to accept the patient.  Yes   Patient/family informed of [facility]'s ownership interest in [facility] and [facility], as well as of the fact that they are under no obligation to receive care at these facilities.  PASRR submitted to EDS on 03/22/18     PASRR number received on 03/22/18     Existing PASRR number confirmed on       FL2 transmitted to all facilities in geographic area requested by pt/family on 03/22/18     FL2 transmitted to all facilities within larger geographic area on       Patient informed that his/her managed care company has contracts with or will negotiate with certain facilities, including the following:  [facility]     Yes   Patient/family informed of bed offers received.  Patient chooses bed at [facility]     Physician recommends and patient chooses bed at      Patient to be transferred to [facility] on 03/26/18.  Patient to be transferred to facility by [facility]     Patient family notified on 03/26/18 of transfer.  Name of family member notified:  [name], daughter     PHYSICIAN Please prepare  priority discharge summary, including medications, Please prepare prescriptions     Additional Comment:    _______________________________________________ Estanislado Emms, LCSW 03/26/2018, 9:56 AM

## 2018-03-26 NOTE — Discharge Summary (Signed)
Physician Discharge Summary  [name] WVP:[id number] DOB: 03-11-1941 DOA: 02/28/2018  PCP: [name], MD  Admit date: 02/28/2018 Discharge date: 03/26/2018  Time spent: 45 minutes  Recommendations for Outpatient Follow-up:  Patient will be discharged to skilled nursing facility.  Continue physical and occupational therapy.  Patient will need to follow up with primary care provider within one week of discharge, repeat CBC, BMP, magnesium.  Follow up with Dr. [name], vascular surgeon. Patient should continue medications as prescribed.  Patient should follow a heart healthy diet.   Discharge Diagnoses:  Right-sided ischemic colitis Acute hypoxic respiratory failure with pulmonary emboli/left femoral DVT/COPD Acute on chronic blood loss anemia/hemorrhagic shock/rectus sheath hematoma/abdominal pain Acute kidney injury with non-anion gap metabolic acidosis New ascites  Urinary retention Hypomagnesemia  Discharge Condition: Stable  Diet recommendation: heart healthy  Filed Weights   03/24/18 0611 03/25/18 0506 03/26/18 0459  Weight: 102.7 kg (226 lb 6.6 oz) 99.1 kg (218 lb 8 oz) 97.7 kg (215 lb 6.2 oz)    History of present illness:  on 03/01/2018 by Dr. [name] a 77 y.o.femalewith medical history significant ofCAD s/p CABG 5462, diastolic CHF with EF 70%, COPD currently on PRN O2 at home, prior DVT not currently anticoagulated due to GIB last year. Patient presents to the ED with c/o 2-3 weeks of nausea, generalized weakness, SOB on exertion, needing to use more O2 at home on exertion than baseline. Some substernal non-radiating CP. CP non exertional.  Hospital Course:  Interim history Admitted on 3/10 with nausea, weakness and dyspnea on exertion, acute respiratory failure with hypoxia. Workup showed multiple subacute pulmonary embolus on CT scan as well as large acute DVT in the left femoral vein. She was incidentally found to have severe  stenosis of superior mesenteric artery. Patient was started on heparin drip, admitted to hospitalist service, had stenting of SMA on 3/18. Since the stent placement, she had worsening abdominal pain, nausea and vomiting.  CTA abdomen and pelvis on 3/19 revealed patent SMA stent with moderate abdominopelvic ascites, colonic wall thickening. GI was consulted, symptoms thought to be from ischemic colitis. However hemoglobin continued to drop and on 3/24 AM, patient had a rapid response with hypotension, acute hypoxic respiratory failure, severe abdominal pain. Repeat CTabdomen pelvis on 3/24 showed a new large anterior abdominal wall hematoma measuring 16.9 x 11.9 X 12.4 cm. Hemoglobin 6.6. Heparin drip, Plavix were discontinued, patient was transfused 2 units packed RBC and transferred to ICU. She was placed on vasopressors per IVC filter placed on 3/24. Patient's creatinine function continued to trend up, renal ultrasound did not show any obstruction or hydronephrosis, possibly due to hypoperfusion/ATN.  For the new onset ascites, patient underwent paracentesis on 3/20 which showed SAAG >1.1 Vascular surgery following patient, recommended starting aspirin once hemoglobin has remained stable.  Right-sided ischemic colitis -Superior mesenteric artery stenosis status post stent placement to SMA on 03/08/2018 -Nausea, vomiting, abdominal pain improved.  Patient currently tolerating solids. -Vascular surgery consulted and appreciated, continue aspirin 81 mg daily, with outpatient follow-up with Dr. [name] -Antibiotics were completed on 03/17/2018  Acute hypoxic respiratory failure with pulmonary emboli/left femoral DVT/COPD -Appears to have improved -Status post IVC filter placement on 03/14/2018 -Has been given IV Lasix to aid her respiratory status, currently on oral Lasix 40 mg twice daily as well as fluid restriction of 1500 cc/day -Continue supplemental oxygen as well as duo nebs as  needed  Acute on chronic blood loss anemia/hemorrhagic shock/rectus sheath hematoma/abdominal  pain -Hemoglobin 8.2 -She did require vasopressors briefly due to shock.  BP currently stable -repeat CBC in one week  Acute kidney injury with non-anion gap metabolic acidosis -Suspect secondary to shock/hypoperfusion, ATN and contrast -Renal ultrasound unremarkable -Baseline creatinine 0.7-1, peaked at 3.2 -Currently creatinine 0.70  New ascites  -status post paracentesis on 03/10/2018 -SAAG >1.1, felt to be inflammatory.  CT scan was cirrhosis, ischemic colitis -Gastroenterology was consulted and signed off  Urinary retention -Resolved, patient did need Foley catheter during this hospitalization however this has been discontinued -Continue to monitor  Hypomagnesemia -Replacing with IV and oral supplementation -repeat in one week  Hypokalemia -replaced, repeat BMP in one week  Code status: partial, No CPR  Consultants PCCM Vascular surgery Interventional radiology Gastroenterology  Procedures  3/18 SMA stenosis status post stent CT ABD /Pelvis 3/19>Interval development of moderate abdominopelvic ascites,Colonic wall thickening involving the cecum, ascending, proximal transverse colon 3/20 status post paracentesis with 100 cc removed CT abd /pelvis 3/24>>New, large anterior abdominal wall hematoma measuring 16.9 x 11.9x 12.4 cm.2. Small volume ascites with nodular appearance of the liver concerning for cirrhosis.. Medium-sized right pleural effusion 3/24: Retrievable IVC filter placed  Discharge Exam: Vitals:   03/25/18 2111 03/26/18 0405  BP: 117/66 (!) 119/49  Pulse: 90 84  Resp: 20 17  Temp: 99.4 F (37.4 C) 98.7 F (37.1 C)  SpO2: 98% 100%   Patient seen and examined.  States she feels better today.  Denies current chest pain, shortness breath, abdominal pain, nausea vomiting, diarrhea constipation.   General: Well developed, well nourished, NAD, appears  stated age  HEENT: NCAT, mucous membranes moist.  Neck: Supple  Cardiovascular: S1 S2 auscultated, irregular  Respiratory: Clear to auscultation bilaterally with equal chest rise  Abdomen: Soft, nontender, nondistended, + bowel sounds  Extremities: warm dry without cyanosis clubbing. ++LE edema  Neuro: AAOx3, nonfocal  Psych: Normal affect and demeanor with intact judgement and insight, pleasant  Discharge Instructions Discharge Instructions    Discharge instructions   Complete by:  As directed    Patient will be discharged to skilled nursing facility.  Continue physical and occupational therapy.  Patient will need to follow up with primary care provider within one week of discharge, repeat CBC, BMP, magnesium.  Follow up with Dr. [name], vascular surgeon. Patient should continue medications as prescribed.  Patient should follow a heart healthy diet.     Allergies as of 03/26/2018      Reactions   Zolpidem Tartrate Anxiety   CONFUSION    Pitavastatin Other (See Comments)   Not sure   Ropinirole Nausea Only   Makes legs jump   Penicillins Rash      Medication List    STOP taking these medications   isosorbide mononitrate 120 MG 24 hr tablet Commonly known as:  IMDUR   traMADol 50 MG tablet Commonly known as:  ULTRAM     TAKE these medications   allopurinol 100 MG tablet Commonly known as:  ZYLOPRIM Take 100 mg by mouth daily.   aspirin EC 81 MG tablet Take 81 mg by mouth daily.   citalopram 20 MG tablet Commonly known as:  CELEXA Take 30 mg by mouth daily.   [employer] D3 1000 units capsule Generic drug:  Cholecalciferol Take 1,000 Units by mouth daily.   feeding supplement (ENSURE ENLIVE) Liqd Take 237 mLs by mouth 3 (three) times daily between meals.   ferrous sulfate 325 (65 FE) MG tablet Take 325 mg by mouth daily with  breakfast.   furosemide 40 MG tablet Commonly known as:  LASIX Take 1 tablet (40 mg total) by mouth 2 (two) times daily.   INCRUSE  ELLIPTA 62.5 MCG/INH Aepb Generic drug:  umeclidinium bromide Inhale 1 puff into the lungs 2 (two) times daily.   Ipratropium-Albuterol 20-100 MCG/ACT Aers respimat Commonly known as:  COMBIVENT Inhale 1 puff into the lungs every 6 (six) hours as needed for wheezing.   magnesium oxide 400 (241.3 Mg) MG tablet Commonly known as:  MAG-OX Take 1 tablet (400 mg total) by mouth 2 (two) times daily.   nitroGLYCERIN 0.4 MG SL tablet Commonly known as:  NITROSTAT Place 1 tablet (0.4 mg total) under the tongue every 5 (five) minutes as needed for chest pain. For chest pain   omeprazole 40 MG capsule Commonly known as:  PRILOSEC Take 40 mg by mouth 2 (two) times daily.   Oxycodone HCl 10 MG Tabs Take 1 tablet (10 mg total) by mouth every 6 (six) hours as needed. What changed:  reasons to take this   pilocarpine 1 % ophthalmic solution Commonly known as:  PILOCAR Place 1 drop into both eyes 2 (two) times daily.   potassium chloride SA 20 MEQ tablet Commonly known as:  K-DUR,KLOR-CON Take 20 mEq by mouth daily.   pramipexole 0.25 MG tablet Commonly known as:  MIRAPEX Take 0.25 mg by mouth at bedtime.   roflumilast 500 MCG Tabs tablet Commonly known as:  DALIRESP Take 500 mcg by mouth daily.   traZODone 50 MG tablet Commonly known as:  DESYREL Take 150 mg by mouth at bedtime.      Allergies  Allergen Reactions  . Zolpidem Tartrate Anxiety    CONFUSION   . Pitavastatin Other (See Comments)    Not sure  . Ropinirole Nausea Only    Makes legs jump  . Penicillins Rash    Contact information for follow-up providers    Brahmbhatt, Parag, MD. Schedule an appointment as soon as possible for a visit in 2 month(s).   Specialty:  Gastroenterology Why:  follow-up for colitis and cirrhosis Contact information: [place] [address] [phone]        [name], [name], MD. Schedule an appointment as soon as possible for a visit in 1 week(s).    Specialty:  Internal Medicine Why:  Hospital follow up Contact information: [address] [place] [address] [phone]        Fay Records, MD .   Specialty:  Cardiology Contact information: [place] [address] [phone]        Elam Dutch, MD. Schedule an appointment as soon as possible for a visit in 1 week(s).   Specialties:  Vascular Surgery, Cardiology Why:  Hospital follow up Contact information: [place] [address] [phone]            Contact information for after-discharge care    Destination    [facility] .   Service:  Skilled Nursing Contact information: [address] [place] [phone]                   The results of significant diagnostics from this hospitalization (including imaging, microbiology, ancillary and laboratory) are listed below for reference.    Significant Diagnostic Studies: Ct Abdomen Pelvis Wo Contrast  Addendum Date: 03/14/2018   ADDENDUM REPORT: 03/14/2018 04:14 ADDENDUM: These results  were called by telephone at the time of interpretation on 03/14/2018 at 4:04 am to [facility] , who verbally acknowledged these results. Electronically Signed   By: Ulyses Jarred M.D.   On: 03/14/2018 04:14   Result Date: 03/14/2018 CLINICAL DATA:  Abdominal pain and fever EXAM: CT ABDOMEN AND PELVIS WITHOUT CONTRAST TECHNIQUE: Multidetector CT imaging of the abdomen and pelvis was performed following the standard protocol without IV contrast. COMPARISON:  CT abdomen pelvis 03/09/2018 FINDINGS: Examination is degraded by streak artifact from external patient devices and from the arms down positioning of the patient, creating multiple streak artifacts and decreasing sensitivity and specificity of this scan. Lower chest: Right pleural effusion and associated atelectasis. Hepatobiliary: Right upper quadrant ascites. Nodular  appearance of the liver. No focal liver lesion. Status post cholecystectomy. Pancreas: Atrophic appearance of the pancreas. Spleen: Normal. Adrenals/Urinary Tract: --Adrenal glands: Normal. --Right kidney/ureter: No hydronephrosis, perinephric stranding or nephrolithiasis. No obstructing ureteral stones. --Left kidney/ureter: No hydronephrosis, perinephric stranding or nephrolithiasis. No obstructing ureteral stones. --Urinary bladder: Normal appearance for the degree of distention. Stomach/Bowel: --Stomach/Duodenum: No hiatal hernia or other gastric abnormality. Normal duodenal course. --Small bowel: No dilatation or inflammation. --Colon: Diffuse colonic diverticulosis without acute inflammation. --Appendix: Normal. Vascular/Lymphatic: Atherosclerotic calcification is present within the non-aneurysmal abdominal aorta, without hemodynamically significant stenosis. No abdominal or pelvic lymphadenopathy. Reproductive: Pelvis largely obscured by streak artifact. Musculoskeletal. Right total hip arthroplasty. L1-L3 posterior spinal fusion. Other: Large anterior abdominal wall hematoma measuring up to 16.9 x 11.9 x 12.4 cm. This is new compared to the recent study from 03/09/2018. This exerts mass effect on the intra-abdominal contents. IMPRESSION: 1. New, large anterior abdominal wall hematoma measuring 16.9 x 11.9 x 12.4 cm. 2. Small volume ascites with nodular appearance of the liver concerning for cirrhosis. 3. Medium-sized right pleural effusion with associated atelectasis. 4. Diffuse colonic diverticulosis without acute inflammation. 5.  Aortic Atherosclerosis (ICD10-I70.0). Electronically Signed: By: Ulyses Jarred M.D. On: 03/14/2018 03:45   Ct Angio Chest Pe W/cm &/or Wo Cm  Result Date: 03/01/2018 CLINICAL DATA:  Subacute onset of nausea. Generalized abdominal pain. Mild generalized chest pain and shortness of breath. EXAM: CT ANGIOGRAPHY CHEST CT ABDOMEN AND PELVIS WITH CONTRAST TECHNIQUE: Multidetector  CT imaging of the chest was performed using the standard protocol during bolus administration of intravenous contrast. Multiplanar CT image reconstructions and MIPs were obtained to evaluate the vascular anatomy. Multidetector CT imaging of the abdomen and pelvis was performed using the standard protocol during bolus administration of intravenous contrast. CONTRAST:  80 mL of Isovue 370 IV contrast COMPARISON:  CT of the abdomen and pelvis performed 11/08/2017, and CT of the thoracic spine performed 07/26/2014 FINDINGS: CTA CHEST FINDINGS Cardiovascular: There appears to be subacute or chronic pulmonary embolus within the pulmonary artery to the left lower lobe, extending minimally into segmental branches. No additional pulmonary embolus is identified. The RV/LV ratio of 1.1 may reflect right heart strain. Diffuse coronary artery calcifications are seen. Diffuse calcification is noted along the aortic arch and proximal great vessels. The heart remains normal in size. The patient is status post median sternotomy. There is a chronic fracture of the inferior-most sternal wire. Mediastinum/Nodes: The mediastinum is otherwise unremarkable in appearance. No mediastinal lymphadenopathy is seen. No pericardial effusion is identified. The thyroid gland is grossly unremarkable. No axillary lymphadenopathy is seen. Lungs/Pleura: Minimal bilateral atelectasis is noted. The lungs are otherwise clear. Mild scarring is noted at the lung apices. No significant focal consolidation, pleural effusion or pneumothorax  is seen. No masses are identified. Musculoskeletal: No acute osseous abnormalities are identified. The patient's right shoulder arthroplasty is incompletely imaged but appears grossly unremarkable. The visualized musculature is unremarkable in appearance. Diffuse calcification is noted about the patient's bilateral breast implants. Review of the MIP images confirms the above findings. CT ABDOMEN and PELVIS FINDINGS  Hepatobiliary: The liver is unremarkable in appearance. The patient is status post cholecystectomy, with clips noted at the gallbladder fossa. There is corresponding distention of the intrahepatic biliary ducts and common bile duct. Pancreas: The pancreas is atrophic and grossly unremarkable in appearance. Spleen: The spleen is unremarkable in appearance. Adrenals/Urinary Tract: Minimal calcification is noted at the left adrenal gland, likely adjacent to a small underlying adrenal lesion. The right adrenal gland is unremarkable. A right renal cyst is again noted, better characterized on the prior study. The kidneys are otherwise unremarkable. There is no evidence of hydronephrosis. No perinephric stranding is seen. Stomach/Bowel: The stomach is unremarkable in appearance. The small bowel is within normal limits. The appendix is normal in caliber, without evidence of appendicitis. The colon is unremarkable in appearance. Vascular/Lymphatic: Diffuse calcification is seen along the abdominal aorta and its branches, including along the right renal artery and superior mesenteric artery. There is likely relatively severe luminal narrowing at the proximal superior mesenteric artery. The abdominal aorta is otherwise grossly unremarkable. The inferior vena cava is grossly unremarkable. No retroperitoneal lymphadenopathy is seen. No pelvic sidewall lymphadenopathy is identified. Reproductive: The bladder is mildly distended and grossly unremarkable. The uterus is unremarkable in appearance. The ovaries are grossly symmetric. No suspicious adnexal masses are seen. Other: No additional soft tissue abnormalities are seen. Musculoskeletal: No acute osseous abnormalities are identified. The patient's right hip arthroplasty is incompletely imaged, but appears grossly unremarkable. The patient is status post lumbar spinal fusion at L1-L3, with underlying decompression from L1-L4. The visualized musculature is unremarkable in  appearance. Review of the MIP images confirms the above findings. IMPRESSION: 1. Apparent subacute or chronic pulmonary embolus within the pulmonary artery to the left lower lung lobe, extending minimally into segmental branches. RV/LV ratio of 1.1 may reflect right heart strain. Would correlate with the patient's symptoms. 2. Minimal bilateral atelectasis noted. Mild scarring at the lung apices. 3. Diffuse coronary artery calcifications seen. 4. Diffuse aortic atherosclerosis, with diffuse calcification along the right renal artery and superior mesenteric artery. Likely relatively severe luminal narrowing at the proximal superior mesenteric artery. Would correlate for any associated symptoms. Critical Value/emergent results were called by telephone at the time of interpretation on 03/01/2018 at 1:00 am to the ER clinical team, who verbally acknowledged these results. Electronically Signed   By: Garald Balding M.D.   On: 03/01/2018 01:03   Ct Abdomen Pelvis W Contrast  Result Date: 03/01/2018 CLINICAL DATA:  Subacute onset of nausea. Generalized abdominal pain. Mild generalized chest pain and shortness of breath. EXAM: CT ANGIOGRAPHY CHEST CT ABDOMEN AND PELVIS WITH CONTRAST TECHNIQUE: Multidetector CT imaging of the chest was performed using the standard protocol during bolus administration of intravenous contrast. Multiplanar CT image reconstructions and MIPs were obtained to evaluate the vascular anatomy. Multidetector CT imaging of the abdomen and pelvis was performed using the standard protocol during bolus administration of intravenous contrast. CONTRAST:  80 mL of Isovue 370 IV contrast COMPARISON:  CT of the abdomen and pelvis performed 11/08/2017, and CT of the thoracic spine performed 07/26/2014 FINDINGS: CTA CHEST FINDINGS Cardiovascular: There appears to be subacute or chronic pulmonary embolus within the pulmonary  artery to the left lower lobe, extending minimally into segmental branches. No  additional pulmonary embolus is identified. The RV/LV ratio of 1.1 may reflect right heart strain. Diffuse coronary artery calcifications are seen. Diffuse calcification is noted along the aortic arch and proximal great vessels. The heart remains normal in size. The patient is status post median sternotomy. There is a chronic fracture of the inferior-most sternal wire. Mediastinum/Nodes: The mediastinum is otherwise unremarkable in appearance. No mediastinal lymphadenopathy is seen. No pericardial effusion is identified. The thyroid gland is grossly unremarkable. No axillary lymphadenopathy is seen. Lungs/Pleura: Minimal bilateral atelectasis is noted. The lungs are otherwise clear. Mild scarring is noted at the lung apices. No significant focal consolidation, pleural effusion or pneumothorax is seen. No masses are identified. Musculoskeletal: No acute osseous abnormalities are identified. The patient's right shoulder arthroplasty is incompletely imaged but appears grossly unremarkable. The visualized musculature is unremarkable in appearance. Diffuse calcification is noted about the patient's bilateral breast implants. Review of the MIP images confirms the above findings. CT ABDOMEN and PELVIS FINDINGS Hepatobiliary: The liver is unremarkable in appearance. The patient is status post cholecystectomy, with clips noted at the gallbladder fossa. There is corresponding distention of the intrahepatic biliary ducts and common bile duct. Pancreas: The pancreas is atrophic and grossly unremarkable in appearance. Spleen: The spleen is unremarkable in appearance. Adrenals/Urinary Tract: Minimal calcification is noted at the left adrenal gland, likely adjacent to a small underlying adrenal lesion. The right adrenal gland is unremarkable. A right renal cyst is again noted, better characterized on the prior study. The kidneys are otherwise unremarkable. There is no evidence of hydronephrosis. No perinephric stranding is seen.  Stomach/Bowel: The stomach is unremarkable in appearance. The small bowel is within normal limits. The appendix is normal in caliber, without evidence of appendicitis. The colon is unremarkable in appearance. Vascular/Lymphatic: Diffuse calcification is seen along the abdominal aorta and its branches, including along the right renal artery and superior mesenteric artery. There is likely relatively severe luminal narrowing at the proximal superior mesenteric artery. The abdominal aorta is otherwise grossly unremarkable. The inferior vena cava is grossly unremarkable. No retroperitoneal lymphadenopathy is seen. No pelvic sidewall lymphadenopathy is identified. Reproductive: The bladder is mildly distended and grossly unremarkable. The uterus is unremarkable in appearance. The ovaries are grossly symmetric. No suspicious adnexal masses are seen. Other: No additional soft tissue abnormalities are seen. Musculoskeletal: No acute osseous abnormalities are identified. The patient's right hip arthroplasty is incompletely imaged, but appears grossly unremarkable. The patient is status post lumbar spinal fusion at L1-L3, with underlying decompression from L1-L4. The visualized musculature is unremarkable in appearance. Review of the MIP images confirms the above findings. IMPRESSION: 1. Apparent subacute or chronic pulmonary embolus within the pulmonary artery to the left lower lung lobe, extending minimally into segmental branches. RV/LV ratio of 1.1 may reflect right heart strain. Would correlate with the patient's symptoms. 2. Minimal bilateral atelectasis noted. Mild scarring at the lung apices. 3. Diffuse coronary artery calcifications seen. 4. Diffuse aortic atherosclerosis, with diffuse calcification along the right renal artery and superior mesenteric artery. Likely relatively severe luminal narrowing at the proximal superior mesenteric artery. Would correlate for any associated symptoms. Critical Value/emergent  results were called by telephone at the time of interpretation on 03/01/2018 at 1:00 am to the ER clinical team, who verbally acknowledged these results. Electronically Signed   By: Garald Balding M.D.   On: 03/01/2018 01:03   Ir Ivc Filter Plmt / S&i /img Guid/mod  Sed  Result Date: 03/14/2018 INDICATION: 77 year old with history of pulmonary embolism and left lower extremity DVT. Patient now has a large abdominal wall hematoma and no longer candidate for anticoagulation. EXAM: IVC FILTER PLACEMENT; IVC VENOGRAM; ULTRASOUND FOR VASCULAR ACCESS Physician: Stephan Minister. Anselm Pancoast, MD MEDICATIONS: None. ANESTHESIA/SEDATION: Fentanyl 25 mcg IV; Versed 0.5 mg IV Moderate Sedation Time:  22 minutes The patient was continuously monitored during the procedure by the interventional radiology nurse under my direct supervision. CONTRAST:  Carbon dioxide FLUOROSCOPY TIME:  Fluoroscopy Time: 7 minutes 6 seconds (327 mGy). COMPLICATIONS: None immediate. PROCEDURE: Informed consent was obtained for an IVC venogram and filter placement. Ultrasound demonstrated a patent right internal jugular vein. Ultrasound images were obtained for documentation. The right side of the neck was prepped and draped in a sterile fashion. Maximal barrier sterile technique was utilized including caps, mask, sterile gowns, sterile gloves, sterile drape, hand hygiene and skin antiseptic. The skin was anesthetized with 1% lidocaine. A 21 gauge needle was directed into the vein with ultrasound guidance and a micropuncture dilator set was placed. A wire was advanced into the IVC. The filter sheath was advanced over the wire into the IVC. An IVC venogram was performed with carbon dioxide. Limited evaluation of the IVC due to carbon dioxide and the lumbar surgical hardware. Therefore, a MPA catheter was used to cannulate bilateral renal veins. Renal venography was also performed with carbon dioxide bilaterally. The location of the renal veins were confidently  identified. Fluoroscopic images were obtained for documentation. A Bard Denali filter was deployed in the IVC below the lowest renal vein. Follow-up images were obtained to confirm adequate deployment of the filter. Vascular sheath was removed with manual compression. FINDINGS: IVC was patent. Bilateral renal veins were identified. The filter was deployed below the lowest renal vein. IMPRESSION: Successful placement of a retrievable IVC filter. PLAN: This IVC filter is potentially retrievable. The patient will be assessed for filter retrieval by Interventional Radiology in approximately 8-12 weeks. Further recommendations regarding filter retrieval, continued surveillance or declaration of device permanence, will be made at that time. Electronically Signed   By: Markus Daft M.D.   On: 03/14/2018 13:03   US Renal  Result Date: 03/15/2018 CLINICAL DATA:  Acute kidney injury. EXAM: RENAL / URINARY TRACT ULTRASOUND COMPLETE COMPARISON:  CT, 03/14/2018. FINDINGS: Right Kidney: Length: 8.6 cm. Diffuse cortical thinning. Increased renal parenchymal echogenicity. There is a 2.6 x 2.0 x 2.3 cm cyst arising from the lower pole. No other masses, no stones and no hydronephrosis. Left Kidney: Length: 9.2 cm. Mild diffuse cortical thinning. Borderline increased renal parenchymal echogenicity. No mass, stone or hydronephrosis. Lower pole partly obscured by overlying bowel gas. Bladder: Decompressed with a Foley catheter.  Not well assessed. Patient also has ascites and a right pleural effusion. IMPRESSION: 1. Findings consistent with medical renal disease with renal cortical thinning, greater on the right, and increased renal parenchymal echogenicity on the right and borderline increased renal parenchymal echogenicity on the left. 2. No acute findings.  No hydronephrosis. 3. 2.6 cm right lower pole renal cyst. Electronically Signed   By: Lajean Manes M.D.   On: 03/15/2018 15:45   Dg Chest Port 1 View  Result Date:  03/18/2018 CLINICAL DATA:  Dyspnea, bilateral leg and arm swelling, abdominal pain and nausea for the past 3-4 days. History of COPD, CHF, and previous CABG. EXAM: PORTABLE CHEST 1 VIEW COMPARISON:  Portable chest x-ray of March 15, 2018 FINDINGS: The lungs are well-expanded. There is no  focal infiltrate. There is no pleural effusion. The cardiac silhouette is enlarged. The pulmonary vascularity is not engorged. There are post CABG changes. There is calcification in the wall of the thoracic aorta. There are bilateral breast implants exhibiting capsular calcification. IMPRESSION: COPD. Mild cardiomegaly. Previous CABG. No pulmonary edema, pneumonia, or pleural effusion. Thoracic aortic atherosclerosis. Electronically Signed   By: David  Martinique M.D.   On: 03/18/2018 09:15   Dg Chest Port 1 View  Result Date: 03/15/2018 CLINICAL DATA:  Pleural effusion. EXAM: PORTABLE CHEST 1 VIEW COMPARISON:  Radiograph of March 13, 2018. FINDINGS: The heart size and mediastinal contours are within normal limits. Atherosclerosis of thoracic aorta is noted. Status post coronary artery bypass graft. No pneumothorax is noted. Increased right basilar opacity is noted concerning for worsening atelectasis or infiltrate with associated pleural effusion. Status post right shoulder arthroplasty. IMPRESSION: Increased right basilar atelectasis or infiltrate is noted with possible associated pleural effusion. Aortic Atherosclerosis (ICD10-I70.0). Electronically Signed   By: Marijo Conception, M.D.   On: 03/15/2018 07:45   Dg Chest Port 1 View  Result Date: 03/13/2018 CLINICAL DATA:  77 year old with shortness of breath. EXAM: PORTABLE CHEST 1 VIEW COMPARISON:  Chest CT 02/28/2018 and chest radiograph 11/08/2017 FINDINGS: Bilateral breast implants. No focal airspace disease or pulmonary edema. Right shoulder replacement. Atherosclerotic calcifications at the aortic arch. Prior CABG procedure. Sclerosis and degenerative changes at the left  shoulder. Surgical hardware in the lumbar spine. Heart size is within normal limits. IMPRESSION: No acute chest findings. Electronically Signed   By: Markus Daft M.D.   On: 03/13/2018 12:52   Dg Abd 2 Views  Result Date: 03/12/2018 CLINICAL DATA:  Lower abdominal pain beginning this morning post vascular surgery EXAM: ABDOMEN - 2 VIEW COMPARISON:  03/08/2018 FINDINGS: Normal bowel gas pattern. No bowel dilatation or bowel wall thickening. Gas present in rectum. Bones demineralized with evidence of prior lumbar fusion and prior RIGHT hip replacement. Scattered degenerative changes of lower thoracic spine. Surgical clips RIGHT upper quadrant question cholecystectomy. Degenerative changes LEFT hip joint. Question vascular calcification versus 8 mm diameter calculus at upper pole LEFT kidney. IMPRESSION: No acute abnormalities. Electronically Signed   By: Lavonia Dana M.D.   On: 03/12/2018 19:28   Dg Abd Portable 1v  Result Date: 03/16/2018 CLINICAL DATA:  Abdominal pain EXAM: PORTABLE ABDOMEN - 1 VIEW COMPARISON:  None. FINDINGS: There is no bowel dilatation or air-fluid level to suggest bowel obstruction. No free air. There is a total hip replacement on the right as well as postoperative change in the lumbar region. There is a filter in the inferior vena cava. There are calcified breast implants bilaterally. IMPRESSION: No demonstrable bowel obstruction or free air. Electronically Signed   By: Lowella Grip III M.D.   On: 03/16/2018 11:37   Dg Abd Portable 1v  Result Date: 03/08/2018 CLINICAL DATA:  Nausea and vomiting EXAM: PORTABLE ABDOMEN - 1 VIEW COMPARISON:  CT 02/28/2018 FINDINGS: Surgical hardware in the upper lumbar spine. Surgical clips in the right upper quadrant. Nonobstructed bowel-gas pattern. Radiopaque material in the bladder. Probable diverticulum on the right side of the bladder. Status post right hip replacement with fixating screw paralleling the cortex of the right inter pelvis.  IMPRESSION: 1. Nonobstructed gas pattern 2. Contrast material within the bladder Electronically Signed   By: Donavan Foil M.D.   On: 03/08/2018 22:30   Ct Angio Abd/pel W/ And/or W/o  Result Date: 03/09/2018 CLINICAL DATA:  Upper abdominal pain and nausea  after mesenteric stent placement today. EXAM: CTA ABDOMEN AND PELVIS wITHOUT AND WITH CONTRAST TECHNIQUE: Multidetector CT imaging of the abdomen and pelvis was performed using the standard protocol during bolus administration of intravenous contrast. Multiplanar reconstructed images and MIPs were obtained and reviewed to evaluate the vascular anatomy. CONTRAST:  100 cc Isovue 370 IV COMPARISON:  Abdomen/pelvis CT 02/28/2018 FINDINGS: VASCULAR Aorta: Moderate atherosclerosis. Normal caliber aorta without aneurysm, dissection, vasculitis or significant stenosis. Celiac: Plaque at the origin causes approximately 50% stenosis. No dissection or vasculitis. Distal branches are patent. SMA: Patent proximal SMA stent with resolved origin stenosis. Calcified distally but patent. No evidence of dissection. No evidence of contrast extravasation. Renals: Calcified plaque at the origins. No dissection or vasculitis. IMA: Patent without evidence of aneurysm, dissection, vasculitis or significant stenosis. Inflow: Patent without evidence of aneurysm, dissection, vasculitis or significant stenosis. Proximal Outflow: Bilateral common femoral and visualized portions of the superficial and profunda femoral arteries are patent without evidence of aneurysm, dissection, vasculitis or significant stenosis. No evidence of pseudoaneurysm formation or active extravasation at the puncture site. Veins: Not well assessed on arterial phase imaging. There is presumed mixing of opacified and non-opacified blood in the mesenteric venous confluence. Review of the MIP images confirms the above findings. NON-VASCULAR Lower chest: Linear atelectasis in both lower lobes. Trace right pleural  effusion. Coronary artery calcifications. Peripherally calcified left breast implant partially included. Hepatobiliary: Slight capsular nodularity raises concern for cirrhosis. No discrete focal lesion. Postcholecystectomy with grossly unchanged biliary prominence allowing for arterial phase technique. Pancreas: Parenchymal atrophy. No ductal dilatation or inflammation. Spleen: Normal in size without focal abnormality. Adrenals/Urinary Tract: Mild left adrenal thickening. Normal right adrenal gland. Excreted IV contrast in both kidneys and urinary bladder from angiogram earlier this day. No hydronephrosis or perinephric edema. Bilateral renal cysts. Stomach/Bowel: Small hiatal hernia. Stomach is nondistended. Presence of intra-abdominal ascites limits bowel evaluation. There is colonic wall thickening involving the ascending colon, hepatic flexure, unlikely proximal transverse colon. No definite small bowel inflammation. Colonic diverticulosis throughout the colon. Lymphatic: No definite adenopathy. No evidence of retroperitoneal hemorrhage. Reproductive: Uterus obscured by right hip arthroplasty. Other: Development of moderate volume abdominopelvic ascites. Fluid may be minimally complex, however does not represent values concerning for hemorrhage. There is mild mesenteric edema. No free air. Small supraumbilical hernia contains fat and small amount of free fluid. Musculoskeletal: Postsurgical change in the lumbar spine, unchanged from prior. Right hip arthroplasty. IMPRESSION: VASCULAR 1. Placement of superior mesenteric artery stent which is widely patent. No evidence of dissection or postprocedural pseudoaneurysm/hematoma. 2. Diffuse aortic and branch atherosclerosis. NON-VASCULAR 1. Interval development of moderate abdominopelvic ascites from prior exam. Fluid is minimally complex but does not represent hemorrhage. 2. Colonic wall thickening involving the cecum, ascending, and proximal transverse colon. This is  new since prior CT. Exact etiology is uncertain. Infectious or inflammatory etiologies are considered, colonic wall edema related to reperfusion could have a similar appearance. No increased density to suggest bowel wall hemorrhage. 3. Colonic diverticulosis without specific findings to suggest diverticulitis. 4. Question of nodular hepatic contours raises concern for cirrhosis. Recommend correlation with cirrhosis risk factors. Electronically Signed   By: Jeb Levering M.D.   On: 03/09/2018 03:18   Ir Paracentesis  Result Date: 03/10/2018 INDICATION: Recent superior mesenteric artery stent placement. New ascites. Request for diagnostic paracentesis. EXAM: ULTRASOUND GUIDED RIGHT LATERAL ABDOMEN PARACENTESIS MEDICATIONS: 2% lidocaine = 10 mL COMPLICATIONS: None immediate. PROCEDURE: Informed written consent was obtained from the patient after a discussion of  the risks, benefits and alternatives to treatment. A timeout was performed prior to the initiation of the procedure. Initial ultrasound scanning demonstrates a small amount of ascites within the right lateral abdomen. The right lateral abdomen was prepped and draped in the usual sterile fashion. 1% lidocaine with epinephrine was used for local anesthesia. Following this, a 19 gauge, 7-cm, Yueh catheter was introduced. An ultrasound image was saved for documentation purposes. The paracentesis was performed. The catheter was removed and a dressing was applied. The patient tolerated the procedure well without immediate post procedural complication. FINDINGS: A total of approximately 100 ml of clear red fluid was removed. Samples were sent to the laboratory as requested by the clinical team. IMPRESSION: Successful ultrasound-guided paracentesis yielding 177mL of peritoneal fluid. Read by: Gareth Eagle, PA-C Electronically Signed   By: Lucrezia Europe M.D.   On: 03/10/2018 11:13    Microbiology: No results found for this or any previous visit (from the past 240  hour(s)).   Labs: Basic Metabolic Panel: Recent Labs  Lab 03/20/18 0714 03/21/18 0324 03/22/18 2774 03/23/18 0407 03/24/18 0342 03/25/18 0349 03/25/18 2039 03/26/18 0342 03/26/18 0826  NA 139 138 138 139 140  --  138  --  139  K 3.7 3.7 4.0 3.9 3.8  --  3.6  --  3.4*  CL 107 107 106 104 101  --  101  --  102  CO2 24 25 25 25 30   --  27  --  30  GLUCOSE 100* 126* 119* 101* 104*  --  126*  --  108*  BUN 25* 21* 18 17 16   --  16  --  16  CREATININE 0.74 0.72 0.69 0.71 0.76  --  0.72  --  0.70  CALCIUM 8.0* 8.1* 8.3* 8.2* 8.1*  --  7.9*  --  7.9*  MG 1.8 1.6* 1.8 1.6* 1.4* 1.4* 1.5* 1.5*  --   PHOS 3.1 2.7  --   --   --   --   --   --   --    Liver Function Tests: No results for input(s): AST, ALT, ALKPHOS, BILITOT, PROT, ALBUMIN in the last 168 hours. No results for input(s): LIPASE, AMYLASE in the last 168 hours. No results for input(s): AMMONIA in the last 168 hours. CBC: Recent Labs  Lab 03/20/18 0332 03/21/18 0324 03/22/18 0653 03/23/18 0407 03/24/18 0342 03/24/18 1008 03/25/18 0349 03/25/18 1036 03/25/18 2039 03/26/18 0342  WBC 12.6* 11.7* 12.7* 12.8* 10.1  --   --   --   --   --   HGB 8.4* 8.2* 8.4* 8.5* 7.8* 8.8* 7.9* 7.5* 8.2* 8.2*  HCT 26.7* 26.3* 27.5* 27.6* 25.2* 29.2* 25.9* 24.7* 27.1* 27.7*  MCV 92.7 93.6 93.9 94.2 93.7  --   --   --   --   --   PLT 145* 154 190 201 192  --   --   --   --   --    Cardiac Enzymes: No results for input(s): CKTOTAL, CKMB, CKMBINDEX, TROPONINI in the last 168 hours. BNP: BNP (last 3 results) Recent Labs    03/13/18 1329 03/18/18 1032  BNP 220.9* 362.6*    ProBNP (last 3 results) Recent Labs    08/17/17 1038 09/14/17 0921  PROBNP 612 503    CBG: Recent Labs  Lab 03/26/18 0909  GLUCAP 121*       Signed:  Fonda Rochon  [facility] 03/26/2018, 10:39 AM

## 2018-03-26 NOTE — Progress Notes (Signed)
Physical Therapy Treatment Patient Details Name: [name] MRN: [id number] DOB: June 30, 1941 Today's Date: 03/26/2018    History of Present Illness Pt is a 77 year old female with past medical history of R THA, B TKA, B TSA, back surgery, neck surgery, CAD, PVD and COPD who is had worsening left hip issues that have gotten to the point where she does not ambulate much over the past month and presented to the emergency room on 3/10 with complaints of nausea, weakness and dyspnea on exertion and found to be in acute respiratory failure with hypoxia. Workup discovered multiplesubacute pulmonary embolus on CT scan. 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  During hospitalization patient is now s/p abdominal angiogram and parcentesis on 3/20.  GI was consulted, symptoms thought to be from ischemic colitis.  However hemoglobin continued to drop and on 3/24 AM, patient had a rapid response with hypotension, acute hypoxic respiratory failure, severe abdominal pain.  Repeat CTabdomen pelvis on 3/24 showed a new large anterior abdominal wall hematoma measuring 16.9 x 11.9  X 12.4 cm.  Hemoglobin 6.6.  Heparin drip, Plavix were discontinued, patient was transfused 2 units packed RBC and transferred to ICU.  IVC filter placed on 3/24.  Patient's creatinine function continued to trend up, renal ultrasound did not show any obstruction or hydronephrosis, possibly due to hypoperfusion/ATN.     PT Comments    Pt remains limited with functional mobility secondary to weakness. She continues to require heavy physical assistance of two for transfers. Pt would continue to benefit from skilled physical therapy services at this time while admitted and after d/c to address the below listed limitations in order to improve overall safety and independence with functional mobility.   Follow Up Recommendations  Supervision/Assistance - 24 hour;SNF     Equipment Recommendations  None  recommended by PT    Recommendations for Other Services       Precautions / Restrictions Precautions Precautions: Fall Restrictions Weight Bearing Restrictions: No    Mobility  Bed Mobility Overal bed mobility: Needs Assistance Bed Mobility: Supine to Sit     Supine to sit: Mod assist;HOB elevated     General bed mobility comments: increased time and effort, use of bed rails, cueing for sequencing, assist with bilateral LEs, pt able to elevate trunk with use of bed rails  Transfers Overall transfer level: Needs assistance Equipment used: Rolling walker (2 wheeled) Transfers: Sit to/from [facility] Sit to Stand: Max assist;+2 physical assistance Stand pivot transfers: Min assist;+2 physical assistance;+2 safety/equipment       General transfer comment: increased time and effort, cueing for safe hand placement, heavy assist to stand from EOB x2 and assist for stability with pivotal movements to chair  Ambulation/Gait             General Gait Details: unable   Stairs            Wheelchair Mobility    Modified Rankin (Stroke Patients Only)       Balance Overall balance assessment: Needs assistance Sitting-balance support: No upper extremity supported;Feet supported Sitting balance-Leahy Scale: Fair     Standing balance support: During functional activity;Bilateral upper extremity supported Standing balance-Leahy Scale: Poor Standing balance comment: reliant on bilateral UEs on RW                            Cognition Arousal/Alertness: Awake/alert Behavior During Therapy: [facility] for tasks assessed/performed Overall  Cognitive Status: Impaired/Different from baseline Area of Impairment: Problem solving                             Problem Solving: Difficulty sequencing;Requires verbal cues        Exercises      General Comments        Pertinent Vitals/Pain Pain Assessment: Faces Faces Pain Scale:  Hurts little more Pain Location: bilateral hips Pain Descriptors / Indicators: Sore Pain Intervention(s): Monitored during session;Repositioned    Home Living                      Prior Function            PT Goals (current goals can now be found in the care plan section) Acute Rehab PT Goals PT Goal Formulation: With patient/family Time For Goal Achievement: 04/02/18 Potential to Achieve Goals: Good Progress towards PT goals: Progressing toward goals    Frequency    Min 3X/week      PT Plan Current plan remains appropriate    Co-evaluation PT/OT/SLP Co-Evaluation/Treatment: Yes Reason for Co-Treatment: For patient/therapist safety;To address functional/ADL transfers PT goals addressed during session: Mobility/safety with mobility;Balance;Proper use of DME;Strengthening/ROM        AM-PAC PT "6 Clicks" Daily Activity  Outcome Measure  Difficulty turning over in bed (including adjusting bedclothes, sheets and blankets)?: Unable Difficulty moving from lying on back to sitting on the side of the bed? : Unable Difficulty sitting down on and standing up from a chair with arms (e.g., wheelchair, bedside commode, etc,.)?: Unable Help needed moving to and from a bed to chair (including a wheelchair)?: A Lot Help needed walking in hospital room?: A Lot Help needed climbing 3-5 steps with a railing? : Total 6 Click Score: 8    End of Session Equipment Utilized During Treatment: Gait belt;Oxygen Activity Tolerance: Patient limited by fatigue Patient left: in chair;with call bell/phone within reach;with chair alarm set Nurse Communication: Mobility status PT Visit Diagnosis: Other abnormalities of gait and mobility (R26.89);Muscle weakness (generalized) (M62.81)     Time: 7782-4235 PT Time Calculation (min) (ACUTE ONLY): 24 min  Charges:  $Therapeutic Activity: 8-22 mins                    G Codes:       [place], [place], [place] 03/26/2018, 11:26 AM

## 2018-03-26 NOTE — Progress Notes (Signed)
Patient's chosen facility, [facility], has received [employer] authorization for patient to admit today. Patient's daughter, Jerrye Beavers will go to facility to complete admissions paperwork this morning. Paged MD. CSW to support with discharge.   Estanislado Emms, [place]

## 2018-03-26 NOTE — Progress Notes (Signed)
Occupational Therapy Treatment Patient Details Name: [name] MRN: [id number] DOB: 1941/05/26 Today's Date: 03/26/2018    History of present illness Pt is a 77 year old female with past medical history of R THA, B TKA, B TSA, back surgery, neck surgery, CAD, PVD and COPD who is had worsening left hip issues that have gotten to the point where she does not ambulate much over the past month and presented to the emergency room on 3/10 with complaints of nausea, weakness and dyspnea on exertion and found to be in acute respiratory failure with hypoxia. Workup discovered multiplesubacute pulmonary embolus on CT scan. 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  During hospitalization patient is now s/p abdominal angiogram and parcentesis on 3/20.  GI was consulted, symptoms thought to be from ischemic colitis.  However hemoglobin continued to drop and on 3/24 AM, patient had a rapid response with hypotension, acute hypoxic respiratory failure, severe abdominal pain.  Repeat CTabdomen pelvis on 3/24 showed a new large anterior abdominal wall hematoma measuring 16.9 x 11.9  X 12.4 cm.  Hemoglobin 6.6.  Heparin drip, Plavix were discontinued, patient was transfused 2 units packed RBC and transferred to ICU.  IVC filter placed on 3/24.  Patient's creatinine function continued to trend up, renal ultrasound did not show any obstruction or hydronephrosis, possibly due to hypoperfusion/ATN.    OT comments  Pt continues to require significant assistance for functional transfers and ADL. Requires max assist +2 for sit to stand and min assist +2 for pivot to chair. Pt required max assist overall for LB bathing and peri care. D/c plan remains appropriate. Will continue to follow acutely.   Follow Up Recommendations  SNF;Supervision/Assistance - 24 hour    Equipment Recommendations  Other (comment)(TBD at next venue)    Recommendations for Other Services      Precautions /  Restrictions Precautions Precautions: Fall Restrictions Weight Bearing Restrictions: No       Mobility Bed Mobility Overal bed mobility: Needs Assistance Bed Mobility: Supine to Sit     Supine to sit: Mod assist;HOB elevated     General bed mobility comments: increased time and effort, use of bed rails, cueing for sequencing, assist with bilateral LEs, pt able to elevate trunk with use of bed rails  Transfers Overall transfer level: Needs assistance Equipment used: Rolling walker (2 wheeled) Transfers: Sit to/from [facility] Sit to Stand: Max assist;+2 physical assistance Stand pivot transfers: Min assist;+2 physical assistance;+2 safety/equipment       General transfer comment: increased time and effort, cueing for safe hand placement, heavy assist to stand from EOB x2 and assist for stability with pivotal movements to chair    Balance Overall balance assessment: Needs assistance Sitting-balance support: No upper extremity supported;Feet supported Sitting balance-Leahy Scale: Fair     Standing balance support: During functional activity;Bilateral upper extremity supported Standing balance-Leahy Scale: Poor Standing balance comment: reliant on bilateral UEs on RW                           ADL either performed or assessed with clinical judgement   ADL Overall ADL's : Needs assistance/impaired             Lower Body Bathing: Maximal assistance;Sit to/from stand;+2 for physical assistance       Lower Body Dressing: Maximal assistance;Sit to/from stand;+2 for physical assistance   Toilet Transfer: Minimal assistance;+2 for physical assistance;Stand-pivot;RW   Toileting- Clothing  Manipulation and Hygiene: Maximal assistance;+2 for physical assistance;Sit to/from stand       Functional mobility during ADLs: Minimal assistance;+2 for physical assistance;Rolling walker(for stand pivot only)       Vision       Perception      Praxis      Cognition Arousal/Alertness: Awake/alert Behavior During Therapy: WFL for tasks assessed/performed Overall Cognitive Status: Impaired/Different from baseline Area of Impairment: Problem solving                             Problem Solving: Difficulty sequencing;Requires verbal cues          Exercises     Shoulder Instructions       General Comments      Pertinent Vitals/ Pain       Pain Assessment: Faces Faces Pain Scale: Hurts little more Pain Location: bilateral hips Pain Descriptors / Indicators: Sore Pain Intervention(s): Monitored during session;Repositioned  Home Living                                          Prior Functioning/Environment              Frequency  Min 2X/week        Progress Toward Goals  OT Goals(current goals can now be found in the care plan section)  Progress towards OT goals: Progressing toward goals  Acute Rehab OT Goals Patient Stated Goal: return to independence with mobility OT Goal Formulation: With patient  Plan Discharge plan remains appropriate    Co-evaluation    PT/OT/SLP Co-Evaluation/Treatment: Yes Reason for Co-Treatment: For patient/therapist safety;To address functional/ADL transfers PT goals addressed during session: Mobility/safety with mobility;Balance;Proper use of DME;Strengthening/ROM OT goals addressed during session: ADL's and self-care      AM-PAC PT "6 Clicks" Daily Activity     Outcome Measure   Help from another person eating meals?: A Little Help from another person taking care of personal grooming?: A Little Help from another person toileting, which includes using toliet, bedpan, or urinal?: A Lot Help from another person bathing (including washing, rinsing, drying)?: A Lot Help from another person to put on and taking off regular upper body clothing?: A Lot Help from another person to put on and taking off regular lower body clothing?: A Lot 6  Click Score: 14    End of Session Equipment Utilized During Treatment: Gait belt;Rolling walker;Oxygen  OT Visit Diagnosis: Other abnormalities of gait and mobility (R26.89);Pain;Muscle weakness (generalized) (M62.81) Pain - Right/Left: (bil) Pain - part of body: Hip   Activity Tolerance Patient tolerated treatment well   Patient Left in chair;with call bell/phone within reach;with chair alarm set   Nurse Communication          Time: 6644-0347 OT Time Calculation (min): 23 min  Charges: OT General Charges $OT Visit: 1 Visit OT Treatments $Self Care/Home Management : 8-22 mins  Madysin Crisp A. Ulice Brilliant, M.S., OTR/L Pager: [place] 03/26/2018, 12:09 PM

## 2018-03-26 NOTE — Discharge Instructions (Signed)
Hematoma A hematoma is a collection of blood. The collection of blood can turn into a hard, painful lump under the skin. Your skin may turn blue or yellow if the hematoma is close to the surface of the skin. Most hematomas get better in a few days to weeks. Some hematomas are serious and need medical care. Hematomas can be very small or very big. Follow these instructions at home:  Apply ice to the injured area: ? Put ice in a plastic bag. ? Place a towel between your skin and the bag. ? Leave the ice on for 20 minutes, 2-3 times a day for the first 1 to 2 days.  After the first 2 days, switch to using warm packs on the injured area.  Raise (elevate) the injured area to lessen pain and puffiness (swelling). You may also wrap the area with an elastic bandage. Make sure the bandage is not wrapped too tight.  If you have a painful hematoma on your leg or foot, you may use crutches for a couple days.  Only take medicines as told by your doctor. Get help right away if:  Your pain gets worse.  Your pain is not controlled with medicine.  You have a fever.  Your puffiness gets worse.  Your skin turns more blue or yellow.  Your skin over the hematoma breaks or starts bleeding.  Your hematoma is in your chest or belly (abdomen) and you are short of breath, feel weak, or have a change in consciousness.  Your hematoma is on your scalp and you have a headache that gets worse or a change in alertness or consciousness. This information is not intended to replace advice given to you by your health care provider. Make sure you discuss any questions you have with your health care provider. Document Released: 01/15/2005 Document Revised: 05/15/2016 Document Reviewed: 05/18/2013 Elsevier Interactive Patient Education  2017 [employer]  

## 2018-03-26 NOTE — Progress Notes (Signed)
Patient will discharge to [facility] Anticipated discharge date: 03/26/18 Family notified: [name], daughter Transportation by: [facility]  Nurse to call report to [phone].   CSW signing off.  Estanislado Emms, [place]  Clinical Social Worker

## 2018-03-26 NOTE — Care Management Note (Signed)
Case Management Note  Patient Details  Name: CRYSTA GULICK MRN: [id number] Date of Birth: 04-Mar-1941  Subjective/Objective: Pt presented as a transfer from 36 M for Recurrent Atrial Fib. S/p cardioversion. Intubated Extubated 03-19-18. Plan for SNF-CSW following.                   Action/Plan: No further needs from CM at this time.   Expected Discharge Date:  03/26/18               Expected Discharge Plan:  [place]  In-House Referral:  Clinical Social Work  Discharge planning Services  CM Consult  Post Acute Care Choice:  NA Choice offered to:  NA  DME Arranged:  N/A DME Agency:  NA  HH Arranged:  NA HH Agency:  NA  Status of Service:  Completed, signed off  If discussed at [employer] of Stay Meetings, dates discussed:  03-23-18, 03-25-18  Additional Comments:  Bethena Roys, RN 03/26/2018, 10:49 AM

## 2018-03-29 ENCOUNTER — Other Ambulatory Visit: Payer: Self-pay

## 2018-03-29 ENCOUNTER — Non-Acute Institutional Stay (SKILLED_NURSING_FACILITY): Payer: Medicare HMO | Admitting: Adult Health

## 2018-03-29 ENCOUNTER — Encounter: Payer: Self-pay | Admitting: Adult Health

## 2018-03-29 DIAGNOSIS — M1A09X0 Idiopathic chronic gout, multiple sites, without tophus (tophi): Secondary | ICD-10-CM

## 2018-03-29 DIAGNOSIS — D500 Iron deficiency anemia secondary to blood loss (chronic): Secondary | ICD-10-CM | POA: Diagnosis not present

## 2018-03-29 DIAGNOSIS — M545 Low back pain, unspecified: Secondary | ICD-10-CM | POA: Insufficient documentation

## 2018-03-29 DIAGNOSIS — J9621 Acute and chronic respiratory failure with hypoxia: Secondary | ICD-10-CM

## 2018-03-29 DIAGNOSIS — K219 Gastro-esophageal reflux disease without esophagitis: Secondary | ICD-10-CM

## 2018-03-29 DIAGNOSIS — I82412 Acute embolism and thrombosis of left femoral vein: Secondary | ICD-10-CM | POA: Diagnosis not present

## 2018-03-29 DIAGNOSIS — K529 Noninfective gastroenteritis and colitis, unspecified: Secondary | ICD-10-CM

## 2018-03-29 DIAGNOSIS — I2510 Atherosclerotic heart disease of native coronary artery without angina pectoris: Secondary | ICD-10-CM | POA: Diagnosis not present

## 2018-03-29 DIAGNOSIS — I5032 Chronic diastolic (congestive) heart failure: Secondary | ICD-10-CM | POA: Insufficient documentation

## 2018-03-29 DIAGNOSIS — J449 Chronic obstructive pulmonary disease, unspecified: Secondary | ICD-10-CM

## 2018-03-29 DIAGNOSIS — K551 Chronic vascular disorders of intestine: Secondary | ICD-10-CM | POA: Diagnosis not present

## 2018-03-29 DIAGNOSIS — I2699 Other pulmonary embolism without acute cor pulmonale: Secondary | ICD-10-CM

## 2018-03-29 DIAGNOSIS — G8929 Other chronic pain: Secondary | ICD-10-CM

## 2018-03-29 DIAGNOSIS — M1A9XX0 Chronic gout, unspecified, without tophus (tophi): Secondary | ICD-10-CM | POA: Insufficient documentation

## 2018-03-29 DIAGNOSIS — I70209 Unspecified atherosclerosis of native arteries of extremities, unspecified extremity: Secondary | ICD-10-CM

## 2018-03-29 NOTE — Progress Notes (Signed)
Location:   [place] Room Number: [place] of Service:  SNF (31)   CODE STATUS: Full Code  Allergies  Allergen Reactions  . Zolpidem Tartrate Anxiety    CONFUSION   . Pitavastatin Other (See Comments)    Not sure  . Ropinirole Nausea Only    Makes legs jump  . Penicillins Rash    Chief Complaint  Patient presents with  . Hospitalization Follow-up    Hospital follow up    HPI:  She is a 77 year old who has had a prolonged hospitalization from 02-28-18 through 03-26-18. She has a history of cad status post cabg; diastolic heart failure; copd on prn 02 at home. She has a history of DVT in the past and is not anticoagulated due to GIB last year. She came to the hospital due to weakness; dyspnea on exertion; and acute respiratory failure with hypoxia.  She was found to have multiple subacute pulmonary embolus and a large acute VT in the left femoral vein. There was incidental finding severe stenosis of superior mesenteric artery. She was started on heparin drip and had stenting of the superior mesenteric arty on 03-08-18. She developed worsening abdominal pain; with n/v. She was thought to have ischemic colitis. Her hgb continue to drop with a repeat ct scan done which revealed a new large anterior abdominal wall hematoma 16.9 x  11.9 x 12.4 cm. Her heparin and plavix were stopped. She did require transfusion of 2 units. She had an IVC filter placed on 03-14-18.  She had new onset ascites and underwent a paraentesis on 03-10-18. She has been restarted on asa 81 mg.  She is here for short term rehab with her goal to return back home. She is having back and bilateral hip pain; she denies any changes in appetite. There are no reports of fever present. She will continue to be followed for her chronic illnesses including: gout; depression; diastolic heart failure. There are no nursing concerns at this time.    Past Medical History:  Diagnosis Date  . Anemia    bld. transfusion post lumbar surgery- 2012  . Anxiety   . Arthralgia    NOS  . Blood transfusion 2011   WITH BACK SURGERY  . CAD (coronary artery disease)    s/p CABG 2004; s/p DES to LM in 2010;  [place] 10/29/11: EF 50-55%, mild elevated filling pressures, no pulmonary HTN, LM 90% ISR, LAD and CFX occluded, S-RI occluded (old), S-OM3 ok and L-LAD ok, native nondominant RCA 95% -  med rx recommended ; Lexiscan Myoview 7/13 at [facility]: demonstrated "normal LV function, anterior attenuation and localized ischemia, inferior, basilar, mid section"  . Carotid artery occlusion    moderate  . Chronic diastolic heart failure (HCC)    Echo 9/10: EF 40-98%, grade 1 diastolic dysfunction  . COPD (chronic obstructive pulmonary disease) ([place])    Emphysema dxed by Dr. Woody Seller in [place] based on PFTs per pt in 2006; placed on albuterol  . Depression    TAKES CELEXA  AND  (OFF- WELBUTRIN)  . DVT of lower extremity (deep venous thrombosis) (HCC)    recurrent. bilateral (2 episodes)  . Dyspnea   . Dysrhythmia    afib with cabg  . Exertional angina (HCC)    Treated with Isosorbide, Ranexa, amlodipine; intolerant to metoprolol  . GERD (gastroesophageal reflux disease)   . HLD (hyperlipidemia)   . Hypertension   . Hyperthyroidism   .  Obesity (BMI 30-39.9) 2009   BMI 33  . Osteoarthritis   . Pneumonia   . PVD (peripheral vascular disease) ([place])   . Sleep apnea 2012   USED CPAP THEN  STARTED USING SPIRIVA , Nov. 2013- last evaluation , changed from mask to aparatus that is just for her nose.Marland Kitchen     Past Surgical History:  Procedure Laterality Date  . ANKLE SURGERY  2004  . [place]   lower; another scheduled, opt. reports 4 back- lumbar, 3 cerv. fusions  for later 2009  . BREAST ENHANCEMENT SURGERY    . CARDIAC CATHETERIZATION  11/2009   Patent LIMA to LAD and patent SVG to OM1. Occluded SVG to ramus and diagonal. Left main: 90% ostial stenosis, LCX 60-70% proximal stenosis  .  COLONOSCOPY WITH PROPOFOL N/A 02/17/2017   Procedure: COLONOSCOPY WITH PROPOFOL;  Surgeon: Jerene Bears, MD;  Location: [facility] ENDOSCOPY;  Service: Endoscopy;  Laterality: N/A;  . CORONARY ANGIOPLASTY WITH STENT PLACEMENT  11/2009   Drug eluting stent to left main artery: 4.0 X 12 mm Ion   . CORONARY ARTERY BYPASS GRAFT  2004  . ESOPHAGOGASTRODUODENOSCOPY N/A 02/14/2017   Procedure: ESOPHAGOGASTRODUODENOSCOPY (EGD);  Surgeon: Doran Stabler, MD;  Location: [facility] ENDOSCOPY;  Service: Endoscopy;  Laterality: N/A;  . EYE SURGERY  2006   BILATERAL CAT EXT.   Marland Kitchen GIVENS CAPSULE STUDY N/A 02/15/2017   Procedure: GIVENS CAPSULE STUDY;  Surgeon: Doran Stabler, MD;  Location: [place];  Service: Endoscopy;  Laterality: N/A;  . GROIN MASS OPEN BIOPSY  2004  . IR IVC FILTER PLMT / S&I /IMG GUID/MOD SED  03/14/2018  . IR PARACENTESIS  03/10/2018  . JOINT REPLACEMENT  2012    BILATERAL KNEES  . JOINT REPLACEMENT  2010    RIGHT HIP REPLACEMENT  . NECK SURGERY  12/08   Due for repeat neck surgery  . PERIPHERAL VASCULAR INTERVENTION Left 03/05/2018   Procedure: PERIPHERAL VASCULAR INTERVENTION;  Surgeon: Elam Dutch, MD;  Location: [place] CV LAB;  Service: Cardiovascular;  Laterality: Left;  Attempted unsuccess\ful Per Dr. Eden Lathe  . PERIPHERAL VASCULAR INTERVENTION  03/08/2018   Procedure: PERIPHERAL VASCULAR INTERVENTION;  Surgeon: Waynetta Sandy, MD;  Location: [place] CV LAB;  Service: Cardiovascular;;  SMA Stent   . REVERSE SHOULDER ARTHROPLASTY  02/26/2012   Procedure: REVERSE SHOULDER ARTHROPLASTY;  Surgeon: Marin Shutter, MD;  Location: [place];  Service: Orthopedics;  Laterality: Right;  RIGHT SHOULDER REVERSED ARTHROPLASTY  . SHOULDER ARTHROSCOPY WITH SUBACROMIAL DECOMPRESSION Left 02/10/2013   Procedure: SHOULDER ARTHROSCOPY WITH SUBACROMIAL DECOMPRESSION DISTAL CLAVICLE RESECTION;  Surgeon: Marin Shutter, MD;  Location: [place];  Service: Orthopedics;  Laterality: Left;  DISTAL  CLAVICLE RESECTION  . TOTAL HIP ARTHROPLASTY  2007   Right  . VISCERAL ANGIOGRAPHY N/A 03/05/2018   Procedure: VISCERAL ANGIOGRAPHY;  Surgeon: Elam Dutch, MD;  Location: [place] CV LAB;  Service: Cardiovascular;  Laterality: N/A;  . VISCERAL ANGIOGRAPHY N/A 03/08/2018   Procedure: VISCERAL ANGIOGRAPHY;  Surgeon: Waynetta Sandy, MD;  Location: [place] CV LAB;  Service: Cardiovascular;  Laterality: N/A;    Social History   Socioeconomic History  . Marital status: Married    Spouse name: Not on file  . Number of children: 3  . Years of education: Not on file  . Highest education level: Not on file  Occupational History  . Occupation: retired    Comment: Corporate treasurer  Employer: RETIRED  Social Needs  . Financial resource strain: Not on file  . Food insecurity:    Worry: Not on file    Inability: Not on file  . Transportation needs:    Medical: Not on file    Non-medical: Not on file  Tobacco Use  . Smoking status: Former Smoker    Years: 45.00    Last attempt to quit: 12/22/2002    Years since quitting: 15.2  . Smokeless tobacco: Never Used  Substance and Sexual Activity  . Alcohol use: No    Alcohol/week: 0.0 oz  . Drug use: No  . Sexual activity: Never  Lifestyle  . Physical activity:    Days per week: Not on file    Minutes per session: Not on file  . Stress: Not on file  Relationships  . Social connections:    Talks on phone: Not on file    Gets together: Not on file    Attends religious service: Not on file    Active member of club or organization: Not on file    Attends meetings of clubs or organizations: Not on file    Relationship status: Not on file  . Intimate partner violence:    Fear of current or ex partner: Not on file    Emotionally abused: Not on file    Physically abused: Not on file    Forced sexual activity: Not on file  Other Topics Concern  . Not on file  Social History Narrative   Married 78 years, lives with  husband.   3 grown children.   Family History  Problem Relation Age of Onset  . Ovarian cancer Mother   . Cancer Mother   . Lung cancer Father   . COPD Father   . Heart disease Father   . Cancer Unknown   . Heart disease Unknown   . Colitis Unknown   . Heart disease Sister   . Cancer Sister       VITAL SIGNS BP 126/69   Pulse 88   Temp (!) 97.4 F (36.3 C)   Resp 16   Ht 5\' 8"  (1.727 m)   Wt 215 lb 6 oz (97.7 kg)   BMI 32.75 kg/m   Outpatient Encounter Medications as of 03/29/2018  Medication Sig  . allopurinol (ZYLOPRIM) 100 MG tablet Take 100 mg by mouth daily.  Marland Kitchen aspirin EC 81 MG tablet Take 81 mg by mouth daily.  . citalopram (CELEXA) 20 MG tablet Take 30 mg by mouth daily.   . [employer] D3 1000 units capsule Take 1,000 Units by mouth daily.  . feeding supplement, ENSURE ENLIVE, (ENSURE ENLIVE) LIQD Take 237 mLs by mouth 3 (three) times daily between meals.  . ferrous sulfate 325 (65 FE) MG tablet Take 325 mg by mouth daily with breakfast.  . furosemide (LASIX) 40 MG tablet Take 1 tablet (40 mg total) by mouth 2 (two) times daily.  . INCRUSE ELLIPTA 62.5 MCG/INH AEPB Inhale 1 puff into the lungs 2 (two) times daily.   . Ipratropium-Albuterol (COMBIVENT) 20-100 MCG/ACT AERS respimat Inhale 1 puff into the lungs every 6 (six) hours as needed for wheezing.  . magnesium oxide (MAG-OX) 400 (241.3 Mg) MG tablet Take 1 tablet (400 mg total) by mouth 2 (two) times daily.  . nitroGLYCERIN (NITROSTAT) 0.4 MG SL tablet Place 1 tablet (0.4 mg total) under the tongue every 5 (five) minutes as needed for chest pain. For chest pain  . omeprazole (PRILOSEC) 40  MG capsule Take 40 mg by mouth 2 (two) times daily.   . Oxycodone HCl 10 MG TABS Take 1 tablet (10 mg total) by mouth every 6 (six) hours as needed.  . pilocarpine (PILOCAR) 1 % ophthalmic solution Place 1 drop into both eyes 2 (two) times daily.  . potassium chloride SA (K-DUR,KLOR-CON) 20 MEQ tablet Take 20 mEq by mouth daily.  .  pramipexole (MIRAPEX) 0.25 MG tablet Take 0.25 mg by mouth at bedtime.  . roflumilast (DALIRESP) 500 MCG TABS tablet Take 500 mcg by mouth daily.   . traZODone (DESYREL) 50 MG tablet Take 150 mg by mouth at bedtime.    No facility-administered encounter medications on file as of 03/29/2018.      SIGNIFICANT DIAGNOSTIC EXAMS  TODAY:   02-28-18: ct angio of chest; ct of abdomen and pelvis: 1. Apparent subacute or chronic pulmonary embolus within the pulmonary artery to the left lower lung lobe, extending minimally into segmental branches. RV/LV ratio of 1.1 may reflect right heart strain. Would correlate with the patient's symptoms. 2. Minimal bilateral atelectasis noted. Mild scarring at the lung apices. 3. Diffuse coronary artery calcifications seen. 4. Diffuse aortic atherosclerosis, with diffuse calcification along the right renal artery and superior mesenteric artery. Likely relatively severe luminal narrowing at the proximal superior mesenteric artery. Would correlate for any associated symptoms.  03-01-18: bilateral lower extremity dopplers: Right: There is no evidence of deep vein thrombosis in the lower extremity. No cystic structure found in the popliteal fossa. Left: There is evidence of acute DVT in the Common Femoral vein. No cystic structure found in the popliteal fossa.   03-02-18: 2-d echo: - Left ventricle: The cavity size was normal. Wall thickness was normal. Systolic function was normal. The estimated ejection fraction was in the range of 55% to 60%. Wall motion was normal; there were no regional wall motion abnormalities. - Aortic valve: There was trivial regurgitation. - Mitral valve: Calcified annulus. Mildly thickened leaflets . - Pulmonary arteries: Systolic pressure was mildly increased.   03-09-18: CTA abdomen and pelvis: IMPRESSION: VASCULAR 1. Placement of superior mesenteric artery stent which is widely patent. No evidence of dissection or postprocedural  pseudoaneurysm/hematoma. 2. Diffuse aortic and branch atherosclerosis. NON-VASCULAR 1. Interval development of moderate abdominopelvic ascites from prior exam. Fluid is minimally complex but does not represent hemorrhage. 2. Colonic wall thickening involving the cecum, ascending, and proximal transverse colon. This is new since prior CT. Exact etiology is uncertain. Infectious or inflammatory etiologies are considered, colonic wall edema related to reperfusion could have a similar appearance. No increased density to suggest bowel wall hemorrhage. 3. Colonic diverticulosis without specific findings to suggest diverticulitis. 4. Question of nodular hepatic contours raises concern for cirrhosis. Recommend correlation with cirrhosis risk factors.   03-10-18: paracentesis: Successful ultrasound-guided paracentesis yielding 182mL of peritoneal fluid.  03-14-18: ct of abdomen and pelvis: 1. New, large anterior abdominal wall hematoma measuring 16.9 x 11.9 x 12.4 cm. 2. Small volume ascites with nodular appearance of the liver concerning for cirrhosis. 3. Medium-sized right pleural effusion with associated atelectasis. 4. Diffuse colonic diverticulosis without acute inflammation. 5.  Aortic Atherosclerosis  03-15-18: renal ultrasound: 1. Findings consistent with medical renal disease with renal cortical thinning, greater on the right, and increased renal parenchymal echogenicity on the right and borderline increased renal parenchymal echogenicity on the left. 2. No acute findings.  No hydronephrosis. 3. 2.6 cm right lower pole renal cyst.   03-18-18: chest x-ray: COPD. Mild cardiomegaly. Previous CABG. No pulmonary  edema, pneumonia, or pleural effusion. Thoracic aortic atherosclerosis.   LABS REVIEWED: TODAY:   02-28-18: wbc 10.7; hgb 12.7; hct 39.8; mv 88.1; plt 248; glucose 97 bun 17; creat 1.35; k+ 2.9; na++ 139; ca 9.0; liver normal albumin 3.3 3-14- 19: wbc 8.0; hgb 12.2; hct 39.0; mcv 87.6; plt  180;  03-10-18: wbc 16.2; hgb 13.0; hct 40.4; mcv 86.7 plt 235; glucose 119; bun 24; creat 1.05; k+ 4.0; na++ 138; ca 8.8 03-11-18: wbc 10.5; hgb 10.4; hct 32.4; mcv 86.4; plt 183; glucose 109; bun 11; creat 0.75 ;k+ 2.9; an++ 139; ca 8.3; liver normal albumin 2.6 vit B 12: 258; folate 21.3; iron 39; tibc 206; ferritin 138 03-20-18: wbc 12.6; hgb 8.4; hct 26.7; mcv  92.7; plt 145; glucose 100; bun 25; creat 0.74; k+ 3.7; na++ 139; ca 8.0; mag 1.8; phos 3.1 03-26-18:hgb 8.2; hct 27.7; glucose 18; bun 16; creat 0.70; k+ 3.4; na++ 138; ca 7.9; mag 1.5    Review of Systems  Constitutional: Negative for malaise/fatigue.  Respiratory: Negative for cough and shortness of breath.   Cardiovascular: Negative for chest pain, palpitations and leg swelling.  Gastrointestinal: Negative for abdominal pain, constipation and heartburn.  Musculoskeletal: Positive for back pain and joint pain. Negative for myalgias.       Bilateral hip pain   Skin: Negative.   Neurological: Negative for dizziness.  Psychiatric/Behavioral: The patient is not nervous/anxious.     Physical Exam  Constitutional: She appears well-developed and well-nourished. No distress.  Neck: No thyromegaly present.  Cardiovascular: Normal rate, normal heart sounds and intact distal pulses.  Heart rate irregular   Pulmonary/Chest: Effort normal and breath sounds normal. No respiratory distress.  Abdominal: Soft. Bowel sounds are normal. She exhibits no distension.  Musculoskeletal: She exhibits edema.  Is able to move all extremities 2-3 + bilateral lower extremity edema   Lymphadenopathy:    She has no cervical adenopathy.  Neurological: She is alert.  Skin: Skin is warm and dry. She is not diaphoretic.     ASSESSMENT/ PLAN:  TODAY:   1. Chronic diastolic heart failure: stable EF: 55-60% (03-02-18): will continue lasix 40 mg twice daily with k+ 20 meq daily  Will monitor her status.   2. Essential hypertension: stable b/p 126/69:  will not make changes will monitor   3. CAD of native artery and native heart without angina status post CABG: is stable will continue asa 81 mg daily has ntg prn.   4. Pulmonary embolism:new left DVT femoral vein: stable will continue asa 81 mg daily she is not a candidate for anticoagulation therapy due to her history of GIB and abdominal wall hematoma. Will monitor    5. Mesenteric artery stenosis: is status post stenting; had ischemic colitis: will continue to monitor her status.   6. COPD with acute on chronic respiratory failure with hypoxia: is stable is 02 dependent; will continue incruse ellipta 62.5 1 puff twice daily; daliresp 500 mcg daily and  combivent 1 puff every 6 hours as needed   7. GERD without esophagitis: stable will continue prilosec 40 mg twice daily   8. Blood loss anemia: stable hgb 8.2 will continue iron daily   9. Chronic gout; multiple joints: stable will continue allopurinol 100 mg daily   10. Depression is stable will continue celexa 30 mg daily and trazodone 150 mg nightly   11. Hypomagnesemia: without change mag level 1.5. Will continue mag ox 400 mg twice daily, she is on PPI twice daily may  need to consider changing this medication.   12. Chronic bilateral low back pain without sciatica: has bilateral hip pain; will change her oxycodone to 10 mg every 6 hours routinely   13. Restless leg syndrome: stable will continue mirapex 0.25 mg nightly   Will check cbc; bmp and mag level       MD is aware of resident's narcotic use and is in agreement with current plan of care. We will attempt to wean resident as apropriate   Ok Edwards NP [facility]  Contact [phone] Monday through Friday 8am- 5pm  After hours call [phone]

## 2018-03-30 ENCOUNTER — Encounter: Payer: Self-pay | Admitting: Internal Medicine

## 2018-03-30 ENCOUNTER — Non-Acute Institutional Stay (SKILLED_NURSING_FACILITY): Payer: Medicare HMO | Admitting: Internal Medicine

## 2018-03-30 DIAGNOSIS — I2699 Other pulmonary embolism without acute cor pulmonale: Secondary | ICD-10-CM

## 2018-03-30 DIAGNOSIS — M545 Low back pain, unspecified: Secondary | ICD-10-CM

## 2018-03-30 DIAGNOSIS — G8929 Other chronic pain: Secondary | ICD-10-CM

## 2018-03-30 DIAGNOSIS — I10 Essential (primary) hypertension: Secondary | ICD-10-CM

## 2018-03-30 DIAGNOSIS — K551 Chronic vascular disorders of intestine: Secondary | ICD-10-CM | POA: Diagnosis not present

## 2018-03-30 DIAGNOSIS — J449 Chronic obstructive pulmonary disease, unspecified: Secondary | ICD-10-CM

## 2018-03-30 DIAGNOSIS — I5032 Chronic diastolic (congestive) heart failure: Secondary | ICD-10-CM | POA: Diagnosis not present

## 2018-03-30 DIAGNOSIS — R188 Other ascites: Secondary | ICD-10-CM | POA: Diagnosis not present

## 2018-03-30 NOTE — Progress Notes (Signed)
Provider: Veleta Miners  Location:   [place] [facility] [place] Room Number: 321/P Place of Service:  SNF ([phone])  PCP: [name], MD Patient Care Team: [name], [name], MD as PCP - General (Specialist) Fay Records, MD as PCP - Cardiology (Cardiology) Kary Kos, MD as Consulting Physician (Neurosurgery)  Extended Emergency Contact Information Primary Emergency Contact: Lilian Kapur [place] of [place] Phone: [phone] Relation: Son Secondary Emergency Contact: Awanda Mink [place] of [place] Mobile Phone: [phone] Relation: Daughter  Code Status: Full Code Goals of Care: Advanced Directive information Advanced Directives 03/30/2018  Does Patient Have a Medical Advance Directive? Yes  Type of Advance Directive (No Data)  Does patient want to make changes to medical advance directive? No - Patient declined  Copy of [place] in Chart? No - copy requested  Would patient like information on creating a medical advance directive? No - Patient declined  Pre-existing out of facility DNR order (yellow form or pink MOST form) -      Chief Complaint  Patient presents with  . New Admit To SNF    New Admission Visit    HPI: Patient is a 77 y.o. female seen today for admission to SNF for therapy after staying in the hospital from 03/10-04/05  Patient has h/o CAD s/p CABG 2725, Diastolic CHF, COPD on Chronic Oxygen at home, Prior DVT not on Any coagulation due to GI bleed, Chronic Pain on oxycodone, Restless legs syndrome  And Anxiety  Patient presented to ED with nausea ,shortness of breath.  She was found to have a subacute PE with a large acute DVT in the left femoral vein.  She also was found to have a severe stenosis of  SMA.  Patient was started on IV heparin and had a stenting of SMA done on 03 /18.  But due to worsening nausea or vomiting she had a CT of abdomen done.  Which showed ascites  and colonic wall  thickness.  But  Patient's hemoglobin continues to drop and she had acute hypoxic respiratory failure. She had repeat CT scan done which showed Large Large anterior abdominal wall hematoma. Her Plavix and Heparin was stopped. She had IVC filter placed by IR  She also had acute renal injury which got resolved. Eventually patient's nausea and vomiting resolved and her abdominal pain improved.  She was discharged to SNF for therapy.  This hospitalization patient was driving and was independent in her ADLs and IADL.She lives alone but has supportive family near by.  Her Main complaint today was pain  In her joints and her stomach.  She says she takes oxycodone on  chronic basis to help her pain. She denies any shortness of breath or cough.  She has no nausea or vomiting Her  appetite is not very good .  Past Medical History:  Diagnosis Date  . Anemia    bld. transfusion post lumbar surgery- 2012  . Anxiety   . Arthralgia    NOS  . Blood transfusion 2011   WITH BACK SURGERY  . CAD (coronary artery disease)    s/p CABG 2004; s/p DES to LM in 2010;  [place] 10/29/11: EF 50-55%, mild elevated filling pressures, no pulmonary HTN, LM 90% ISR, LAD and CFX occluded, S-RI occluded (old), S-OM3 ok and L-LAD ok, native nondominant RCA 95% -  med rx recommended ; Lexiscan Myoview 7/13 at [facility]: demonstrated "normal LV function, anterior attenuation and localized ischemia, inferior,  basilar, mid section"  . Carotid artery occlusion    moderate  . Chronic diastolic heart failure (HCC)    Echo 9/10: EF 59-56%, grade 1 diastolic dysfunction  . COPD (chronic obstructive pulmonary disease) ([place])    Emphysema dxed by Dr. Woody Seller in [place] based on PFTs per pt in 2006; placed on albuterol  . Depression    TAKES CELEXA  AND  (OFF- WELBUTRIN)  . DVT of lower extremity (deep venous thrombosis) (HCC)    recurrent. bilateral (2 episodes)  . Dyspnea   . Dysrhythmia    afib with cabg  . Exertional angina (HCC)      Treated with Isosorbide, Ranexa, amlodipine; intolerant to metoprolol  . GERD (gastroesophageal reflux disease)   . HLD (hyperlipidemia)   . Hypertension   . Hyperthyroidism   . Obesity (BMI 30-39.9) 2009   BMI 33  . Osteoarthritis   . Pneumonia   . PVD (peripheral vascular disease) ([place])   . Sleep apnea 2012   USED CPAP THEN  STARTED USING SPIRIVA , Nov. 2013- last evaluation , changed from mask to aparatus that is just for her nose.Marland Kitchen    Past Surgical History:  Procedure Laterality Date  . ANKLE SURGERY  2004  . [place]   lower; another scheduled, opt. reports 4 back- lumbar, 3 cerv. fusions  for later 2009  . BREAST ENHANCEMENT SURGERY    . CARDIAC CATHETERIZATION  11/2009   Patent LIMA to LAD and patent SVG to OM1. Occluded SVG to ramus and diagonal. Left main: 90% ostial stenosis, LCX 60-70% proximal stenosis  . COLONOSCOPY WITH PROPOFOL N/A 02/17/2017   Procedure: COLONOSCOPY WITH PROPOFOL;  Surgeon: Jerene Bears, MD;  Location: [facility] ENDOSCOPY;  Service: Endoscopy;  Laterality: N/A;  . CORONARY ANGIOPLASTY WITH STENT PLACEMENT  11/2009   Drug eluting stent to left main artery: 4.0 X 12 mm Ion   . CORONARY ARTERY BYPASS GRAFT  2004  . ESOPHAGOGASTRODUODENOSCOPY N/A 02/14/2017   Procedure: ESOPHAGOGASTRODUODENOSCOPY (EGD);  Surgeon: Doran Stabler, MD;  Location: [facility] ENDOSCOPY;  Service: Endoscopy;  Laterality: N/A;  . EYE SURGERY  2006   BILATERAL CAT EXT.   Marland Kitchen GIVENS CAPSULE STUDY N/A 02/15/2017   Procedure: GIVENS CAPSULE STUDY;  Surgeon: Doran Stabler, MD;  Location: [place];  Service: Endoscopy;  Laterality: N/A;  . GROIN MASS OPEN BIOPSY  2004  . IR IVC FILTER PLMT / S&I /IMG GUID/MOD SED  03/14/2018  . IR PARACENTESIS  03/10/2018  . JOINT REPLACEMENT  2012    BILATERAL KNEES  . JOINT REPLACEMENT  2010    RIGHT HIP REPLACEMENT  . NECK SURGERY  12/08   Due for repeat neck surgery  . PERIPHERAL VASCULAR INTERVENTION Left 03/05/2018   Procedure:  PERIPHERAL VASCULAR INTERVENTION;  Surgeon: Elam Dutch, MD;  Location: [place] CV LAB;  Service: Cardiovascular;  Laterality: Left;  Attempted unsuccess\ful Per Dr. Eden Lathe  . PERIPHERAL VASCULAR INTERVENTION  03/08/2018   Procedure: PERIPHERAL VASCULAR INTERVENTION;  Surgeon: Waynetta Sandy, MD;  Location: [place] CV LAB;  Service: Cardiovascular;;  SMA Stent   . REVERSE SHOULDER ARTHROPLASTY  02/26/2012   Procedure: REVERSE SHOULDER ARTHROPLASTY;  Surgeon: Marin Shutter, MD;  Location: [place];  Service: Orthopedics;  Laterality: Right;  RIGHT SHOULDER REVERSED ARTHROPLASTY  . SHOULDER ARTHROSCOPY WITH SUBACROMIAL DECOMPRESSION Left 02/10/2013   Procedure: SHOULDER ARTHROSCOPY WITH SUBACROMIAL DECOMPRESSION DISTAL CLAVICLE RESECTION;  Surgeon: Marin Shutter, MD;  [facility];  Service: Orthopedics;  Laterality: Left;  DISTAL CLAVICLE RESECTION  . TOTAL HIP ARTHROPLASTY  2007   Right  . VISCERAL ANGIOGRAPHY N/A 03/05/2018   Procedure: VISCERAL ANGIOGRAPHY;  Surgeon: Elam Dutch, MD;  Location: [place] CV LAB;  Service: Cardiovascular;  Laterality: N/A;  . VISCERAL ANGIOGRAPHY N/A 03/08/2018   Procedure: VISCERAL ANGIOGRAPHY;  Surgeon: Waynetta Sandy, MD;  Location: [place] CV LAB;  Service: Cardiovascular;  Laterality: N/A;    reports that she quit smoking about 15 years ago. She quit after 45.00 years of use. She has never used smokeless tobacco. She reports that she does not drink alcohol or use drugs. Social History   Socioeconomic History  . Marital status: Married    Spouse name: Not on file  . Number of children: 3  . Years of education: Not on file  . Highest education level: Not on file  Occupational History  . Occupation: retired    Comment: Electronics engineer: RETIRED  Social Needs  . Financial resource strain: Not on file  . Food insecurity:    Worry: Not on file    Inability: Not on file  . Transportation needs:     Medical: Not on file    Non-medical: Not on file  Tobacco Use  . Smoking status: Former Smoker    Years: 45.00    Last attempt to quit: 12/22/2002    Years since quitting: 15.2  . Smokeless tobacco: Never Used  Substance and Sexual Activity  . Alcohol use: No    Alcohol/week: 0.0 oz  . Drug use: No  . Sexual activity: Never  Lifestyle  . Physical activity:    Days per week: Not on file    Minutes per session: Not on file  . Stress: Not on file  Relationships  . Social connections:    Talks on phone: Not on file    Gets together: Not on file    Attends religious service: Not on file    Active member of club or organization: Not on file    Attends meetings of clubs or organizations: Not on file    Relationship status: Not on file  . Intimate partner violence:    Fear of current or ex partner: Not on file    Emotionally abused: Not on file    Physically abused: Not on file    Forced sexual activity: Not on file  Other Topics Concern  . Not on file  Social History Narrative   Married 30 years, lives with husband.   3 grown children.    Functional Status Survey:    Family History  Problem Relation Age of Onset  . Ovarian cancer Mother   . Cancer Mother   . Lung cancer Father   . COPD Father   . Heart disease Father   . Cancer Unknown   . Heart disease Unknown   . Colitis Unknown   . Heart disease Sister   . Cancer Sister     Health Maintenance  Topic Date Due  . DEXA SCAN  05/03/2018 (Originally 07/25/2005)  . INFLUENZA VACCINE  07/22/2018  . TETANUS/TDAP  Discontinued  . PNA vac Low Risk Adult  Discontinued    Allergies  Allergen Reactions  . Zolpidem Tartrate Anxiety    CONFUSION   . Pitavastatin Other (See Comments)    Not sure  . Ropinirole Nausea Only    Makes legs jump  . Penicillins Rash  Allergies as of 03/30/2018      Reactions   Zolpidem Tartrate Anxiety   CONFUSION    Pitavastatin Other (See Comments)   Not sure   Ropinirole Nausea  Only   Makes legs jump   Penicillins Rash      Medication List        Accurate as of 03/30/18 12:41 PM. Always use your most recent med list.          allopurinol 100 MG tablet Commonly known as:  ZYLOPRIM Take 100 mg by mouth daily.   aspirin EC 81 MG tablet Take 81 mg by mouth daily.   citalopram 20 MG tablet Commonly known as:  CELEXA Take 30 mg by mouth daily.   [employer] D3 1000 units capsule Generic drug:  Cholecalciferol Take 1,000 Units by mouth daily.   feeding supplement (ENSURE ENLIVE) Liqd Take 237 mLs by mouth 3 (three) times daily between meals.   ferrous sulfate 325 (65 FE) MG tablet Take 325 mg by mouth daily with breakfast.   furosemide 40 MG tablet Commonly known as:  LASIX Take 1 tablet (40 mg total) by mouth 2 (two) times daily.   INCRUSE ELLIPTA 62.5 MCG/INH Aepb Generic drug:  umeclidinium bromide Inhale 1 puff into the lungs 2 (two) times daily.   Ipratropium-Albuterol 20-100 MCG/ACT Aers respimat Commonly known as:  COMBIVENT Inhale 1 puff into the lungs every 6 (six) hours as needed for wheezing.   magnesium oxide 400 (241.3 Mg) MG tablet Commonly known as:  MAG-OX Take 1 tablet (400 mg total) by mouth 2 (two) times daily.   nitroGLYCERIN 0.4 MG SL tablet Commonly known as:  NITROSTAT Place 1 tablet (0.4 mg total) under the tongue every 5 (five) minutes as needed for chest pain. For chest pain   omeprazole 40 MG capsule Commonly known as:  PRILOSEC Take 40 mg by mouth 2 (two) times daily.   Oxycodone HCl 10 MG Tabs Take 1 tablet (10 mg total) by mouth every 6 (six) hours as needed.   pilocarpine 1 % ophthalmic solution Commonly known as:  PILOCAR Place 1 drop into both eyes 2 (two) times daily.   potassium chloride SA 20 MEQ tablet Commonly known as:  K-DUR,KLOR-CON Take 20 mEq by mouth daily.   pramipexole 0.25 MG tablet Commonly known as:  MIRAPEX Take 0.25 mg by mouth at bedtime.   roflumilast 500 MCG Tabs tablet Commonly  known as:  DALIRESP Take 500 mcg by mouth daily.   traZODone 50 MG tablet Commonly known as:  DESYREL Take 150 mg by mouth at bedtime.       Review of Systems  Constitutional: Positive for activity change and appetite change.  HENT: Negative.   Respiratory: Positive for shortness of breath. Negative for cough and wheezing.   Cardiovascular: Positive for leg swelling.  Gastrointestinal: Positive for abdominal distention and abdominal pain.  Genitourinary: Negative.   Musculoskeletal: Positive for back pain and myalgias.  Neurological: Positive for weakness.  Psychiatric/Behavioral: Negative.        Vitals:   03/30/18 1229  BP: 120/61  Pulse: 84  Resp: 20  Temp: 98 F (36.7 C)  TempSrc: Oral  SpO2: 100%   There is no height or weight on file to calculate BMI. Physical Exam  Constitutional: She is oriented to person, place, and time. She appears well-developed and well-nourished.  HENT:  Head: Normocephalic.  Mouth/Throat: Oropharynx is clear and moist.  Eyes: Pupils are equal, round, and reactive to light.  Cardiovascular: Normal rate and regular rhythm.  No murmur heard. Pulmonary/Chest: Effort normal and breath sounds normal. No stridor. No respiratory distress. She has no wheezes. She has no rales.  Musculoskeletal:  Moderate Edema Bilateral  Lymphadenopathy:    She has no cervical adenopathy.  Neurological: She is alert and oriented to person, place, and time.  Has Mild weakness in Left LE.   Skin: Skin is warm and dry.  Psychiatric: She has a normal mood and affect. Her behavior is normal. Thought content normal.    Labs reviewed: Basic Metabolic Panel: Recent Labs    03/19/18 0300 03/20/18 0714 03/21/18 0324  03/24/18 0342 03/25/18 0349 03/25/18 2039 03/26/18 0342 03/26/18 0826  NA 136 139 138   < > 140  --  138  --  139  K 3.7 3.7 3.7   < > 3.8  --  3.6  --  3.4*  CL 108 107 107   < > 101  --  101  --  102  CO2 20* 24 25   < > 30  --  27  --   30  GLUCOSE 107* 100* 126*   < > 104*  --  126*  --  108*  BUN 35* 25* 21*   < > 16  --  16  --  16  CREATININE 0.99 0.74 0.72   < > 0.76  --  0.72  --  0.70  CALCIUM 7.9* 8.0* 8.1*   < > 8.1*  --  7.9*  --  7.9*  MG 2.3 1.8 1.6*   < > 1.4* 1.4* 1.5* 1.5*  --   PHOS 2.9 3.1 2.7  --   --   --   --   --   --    < > = values in this interval not displayed.   Liver Function Tests: Recent Labs    02/28/18 1924 03/11/18 0337  AST 15 21  ALT 10* 19  ALKPHOS 85 61  BILITOT 0.3 0.6  PROT 6.4* 5.0*  ALBUMIN 3.3* 2.6*   Recent Labs    02/28/18 1924 03/11/18 0337 03/16/18 1132  LIPASE 20 20 18    No results for input(s): AMMONIA in the last 8760 hours. CBC: Recent Labs    03/17/18 0430 03/17/18 0757 03/18/18 0318  03/22/18 2458 03/23/18 0407 03/24/18 0342  03/25/18 1036 03/25/18 2039 03/26/18 0342  WBC 15.9* 16.6* 15.6*   < > 12.7* 12.8* 10.1  --   --   --   --   NEUTROABS 13.0* 13.6* 12.9*  --   --   --   --   --   --   --   --   HGB 7.7* 7.8* 7.9*   < > 8.4* 8.5* 7.8*   < > 7.5* 8.2* 8.2*  HCT 23.2* 23.6* 24.2*   < > 27.5* 27.6* 25.2*   < > 24.7* 27.1* 27.7*  MCV 87.9 87.7 89.3   < > 93.9 94.2 93.7  --   --   --   --   PLT 125* 119* 120*   < > 190 201 192  --   --   --   --    < > = values in this interval not displayed.   Cardiac Enzymes: Recent Labs    03/13/18 1329 03/13/18 1709 03/14/18 1103  TROPONINI <0.03 <0.03 0.04*   BNP: Invalid input(s): POCBNP No results found for: HGBA1C Lab Results  Component Value Date   TSH 10.230 (H)  08/17/2017   Lab Results  Component Value Date   VITAMINB12 258 03/11/2018   Lab Results  Component Value Date   FOLATE 21.3 03/11/2018   Lab Results  Component Value Date   IRON 39 03/11/2018   TIBC 206 (L) 03/11/2018   FERRITIN 138 03/11/2018    Imaging and Procedures obtained prior to SNF admission: Ct Angio Chest Pe W/cm &/or Wo Cm  Result Date: 03/01/2018 CLINICAL DATA:  Subacute onset of nausea. Generalized  abdominal pain. Mild generalized chest pain and shortness of breath. EXAM: CT ANGIOGRAPHY CHEST CT ABDOMEN AND PELVIS WITH CONTRAST TECHNIQUE: Multidetector CT imaging of the chest was performed using the standard protocol during bolus administration of intravenous contrast. Multiplanar CT image reconstructions and MIPs were obtained to evaluate the vascular anatomy. Multidetector CT imaging of the abdomen and pelvis was performed using the standard protocol during bolus administration of intravenous contrast. CONTRAST:  80 mL of Isovue 370 IV contrast COMPARISON:  CT of the abdomen and pelvis performed 11/08/2017, and CT of the thoracic spine performed 07/26/2014 FINDINGS: CTA CHEST FINDINGS Cardiovascular: There appears to be subacute or chronic pulmonary embolus within the pulmonary artery to the left lower lobe, extending minimally into segmental branches. No additional pulmonary embolus is identified. The RV/LV ratio of 1.1 may reflect right heart strain. Diffuse coronary artery calcifications are seen. Diffuse calcification is noted along the aortic arch and proximal great vessels. The heart remains normal in size. The patient is status post median sternotomy. There is a chronic fracture of the inferior-most sternal wire. Mediastinum/Nodes: The mediastinum is otherwise unremarkable in appearance. No mediastinal lymphadenopathy is seen. No pericardial effusion is identified. The thyroid gland is grossly unremarkable. No axillary lymphadenopathy is seen. Lungs/Pleura: Minimal bilateral atelectasis is noted. The lungs are otherwise clear. Mild scarring is noted at the lung apices. No significant focal consolidation, pleural effusion or pneumothorax is seen. No masses are identified. Musculoskeletal: No acute osseous abnormalities are identified. The patient's right shoulder arthroplasty is incompletely imaged but appears grossly unremarkable. The visualized musculature is unremarkable in appearance. Diffuse  calcification is noted about the patient's bilateral breast implants. Review of the MIP images confirms the above findings. CT ABDOMEN and PELVIS FINDINGS Hepatobiliary: The liver is unremarkable in appearance. The patient is status post cholecystectomy, with clips noted at the gallbladder fossa. There is corresponding distention of the intrahepatic biliary ducts and common bile duct. Pancreas: The pancreas is atrophic and grossly unremarkable in appearance. Spleen: The spleen is unremarkable in appearance. Adrenals/Urinary Tract: Minimal calcification is noted at the left adrenal gland, likely adjacent to a small underlying adrenal lesion. The right adrenal gland is unremarkable. A right renal cyst is again noted, better characterized on the prior study. The kidneys are otherwise unremarkable. There is no evidence of hydronephrosis. No perinephric stranding is seen. Stomach/Bowel: The stomach is unremarkable in appearance. The small bowel is within normal limits. The appendix is normal in caliber, without evidence of appendicitis. The colon is unremarkable in appearance. Vascular/Lymphatic: Diffuse calcification is seen along the abdominal aorta and its branches, including along the right renal artery and superior mesenteric artery. There is likely relatively severe luminal narrowing at the proximal superior mesenteric artery. The abdominal aorta is otherwise grossly unremarkable. The inferior vena cava is grossly unremarkable. No retroperitoneal lymphadenopathy is seen. No pelvic sidewall lymphadenopathy is identified. Reproductive: The bladder is mildly distended and grossly unremarkable. The uterus is unremarkable in appearance. The ovaries are grossly symmetric. No suspicious adnexal masses are seen.  Other: No additional soft tissue abnormalities are seen. Musculoskeletal: No acute osseous abnormalities are identified. The patient's right hip arthroplasty is incompletely imaged, but appears grossly  unremarkable. The patient is status post lumbar spinal fusion at L1-L3, with underlying decompression from L1-L4. The visualized musculature is unremarkable in appearance. Review of the MIP images confirms the above findings. IMPRESSION: 1. Apparent subacute or chronic pulmonary embolus within the pulmonary artery to the left lower lung lobe, extending minimally into segmental branches. RV/LV ratio of 1.1 may reflect right heart strain. Would correlate with the patient's symptoms. 2. Minimal bilateral atelectasis noted. Mild scarring at the lung apices. 3. Diffuse coronary artery calcifications seen. 4. Diffuse aortic atherosclerosis, with diffuse calcification along the right renal artery and superior mesenteric artery. Likely relatively severe luminal narrowing at the proximal superior mesenteric artery. Would correlate for any associated symptoms. Critical Value/emergent results were called by telephone at the time of interpretation on 03/01/2018 at 1:00 am to the ER clinical team, who verbally acknowledged these results. Electronically Signed   By: Garald Balding M.D.   On: 03/01/2018 01:03   Ct Abdomen Pelvis W Contrast  Result Date: 03/01/2018 CLINICAL DATA:  Subacute onset of nausea. Generalized abdominal pain. Mild generalized chest pain and shortness of breath. EXAM: CT ANGIOGRAPHY CHEST CT ABDOMEN AND PELVIS WITH CONTRAST TECHNIQUE: Multidetector CT imaging of the chest was performed using the standard protocol during bolus administration of intravenous contrast. Multiplanar CT image reconstructions and MIPs were obtained to evaluate the vascular anatomy. Multidetector CT imaging of the abdomen and pelvis was performed using the standard protocol during bolus administration of intravenous contrast. CONTRAST:  80 mL of Isovue 370 IV contrast COMPARISON:  CT of the abdomen and pelvis performed 11/08/2017, and CT of the thoracic spine performed 07/26/2014 FINDINGS: CTA CHEST FINDINGS Cardiovascular: There  appears to be subacute or chronic pulmonary embolus within the pulmonary artery to the left lower lobe, extending minimally into segmental branches. No additional pulmonary embolus is identified. The RV/LV ratio of 1.1 may reflect right heart strain. Diffuse coronary artery calcifications are seen. Diffuse calcification is noted along the aortic arch and proximal great vessels. The heart remains normal in size. The patient is status post median sternotomy. There is a chronic fracture of the inferior-most sternal wire. Mediastinum/Nodes: The mediastinum is otherwise unremarkable in appearance. No mediastinal lymphadenopathy is seen. No pericardial effusion is identified. The thyroid gland is grossly unremarkable. No axillary lymphadenopathy is seen. Lungs/Pleura: Minimal bilateral atelectasis is noted. The lungs are otherwise clear. Mild scarring is noted at the lung apices. No significant focal consolidation, pleural effusion or pneumothorax is seen. No masses are identified. Musculoskeletal: No acute osseous abnormalities are identified. The patient's right shoulder arthroplasty is incompletely imaged but appears grossly unremarkable. The visualized musculature is unremarkable in appearance. Diffuse calcification is noted about the patient's bilateral breast implants. Review of the MIP images confirms the above findings. CT ABDOMEN and PELVIS FINDINGS Hepatobiliary: The liver is unremarkable in appearance. The patient is status post cholecystectomy, with clips noted at the gallbladder fossa. There is corresponding distention of the intrahepatic biliary ducts and common bile duct. Pancreas: The pancreas is atrophic and grossly unremarkable in appearance. Spleen: The spleen is unremarkable in appearance. Adrenals/Urinary Tract: Minimal calcification is noted at the left adrenal gland, likely adjacent to a small underlying adrenal lesion. The right adrenal gland is unremarkable. A right renal cyst is again noted,  better characterized on the prior study. The kidneys are otherwise unremarkable. There is  no evidence of hydronephrosis. No perinephric stranding is seen. Stomach/Bowel: The stomach is unremarkable in appearance. The small bowel is within normal limits. The appendix is normal in caliber, without evidence of appendicitis. The colon is unremarkable in appearance. Vascular/Lymphatic: Diffuse calcification is seen along the abdominal aorta and its branches, including along the right renal artery and superior mesenteric artery. There is likely relatively severe luminal narrowing at the proximal superior mesenteric artery. The abdominal aorta is otherwise grossly unremarkable. The inferior vena cava is grossly unremarkable. No retroperitoneal lymphadenopathy is seen. No pelvic sidewall lymphadenopathy is identified. Reproductive: The bladder is mildly distended and grossly unremarkable. The uterus is unremarkable in appearance. The ovaries are grossly symmetric. No suspicious adnexal masses are seen. Other: No additional soft tissue abnormalities are seen. Musculoskeletal: No acute osseous abnormalities are identified. The patient's right hip arthroplasty is incompletely imaged, but appears grossly unremarkable. The patient is status post lumbar spinal fusion at L1-L3, with underlying decompression from L1-L4. The visualized musculature is unremarkable in appearance. Review of the MIP images confirms the above findings. IMPRESSION: 1. Apparent subacute or chronic pulmonary embolus within the pulmonary artery to the left lower lung lobe, extending minimally into segmental branches. RV/LV ratio of 1.1 may reflect right heart strain. Would correlate with the patient's symptoms. 2. Minimal bilateral atelectasis noted. Mild scarring at the lung apices. 3. Diffuse coronary artery calcifications seen. 4. Diffuse aortic atherosclerosis, with diffuse calcification along the right renal artery and superior mesenteric artery. Likely  relatively severe luminal narrowing at the proximal superior mesenteric artery. Would correlate for any associated symptoms. Critical Value/emergent results were called by telephone at the time of interpretation on 03/01/2018 at 1:00 am to the ER clinical team, who verbally acknowledged these results. Electronically Signed   By: Garald Balding M.D.   On: 03/01/2018 01:03    Assessment/Plan Right-sided ischemic colitis Mesenteric artery stenosis  S/P Stent Placement in 03/18 Patient is doing well though has Decreased appetite but no significant pain. On Aspirin Follows with Vascular.  Acute PE with DVT Not on any Anticoagulation due to Bleeding. She s/p IVC filter Does have swelling in her legs and continues to need Oxygen.   Chronic diastolic heart failure  On Lasix 40 mg BID. Was on higher doses as out patient Will follow her weights. Today her weight was 207 lbs. Repeat BMP pending.  Ascites New Onset Her Albumin is low and SAAG was more then 1.1 .  Continue Fluid restriction for now  Anemia with Abdominal Bleed Hgb Stable. On Iron Supplement Follow up CBC Acute renal Failure Renal US was negative in Hospital Creat is back to baseline  Essential hypertension BP controlled on Lasix Only  COPD On Chronic Oxygen Continue her Bronchodilators  Chronic bilateral low back pain  D/W the pateint will  Continue her on Oxycodone Q6 Hours.  This is her home dose  Anxiety with Depression Continue on Trazadone and Celexa. Repeat TSH as was high before.       Family/ staff Communication:   Labs/tests ordered: Total time spent in this patient care encounter was 45_ minutes; greater than 50% of the visit spent counseling patient, reviewing records , Labs and coordinating care for problems addressed at this encounter.

## 2018-04-01 ENCOUNTER — Encounter (HOSPITAL_COMMUNITY): Payer: Self-pay

## 2018-04-01 ENCOUNTER — Emergency Department (HOSPITAL_COMMUNITY): Payer: Medicare HMO

## 2018-04-01 ENCOUNTER — Other Ambulatory Visit: Payer: Self-pay

## 2018-04-01 ENCOUNTER — Encounter: Payer: Self-pay | Admitting: Internal Medicine

## 2018-04-01 ENCOUNTER — Non-Acute Institutional Stay (SKILLED_NURSING_FACILITY): Payer: Medicare HMO | Admitting: Internal Medicine

## 2018-04-01 ENCOUNTER — Inpatient Hospital Stay (HOSPITAL_COMMUNITY)
Admission: EM | Admit: 2018-04-01 | Discharge: 2018-04-06 | DRG: 689 | Disposition: A | Discharge status: Skilled Nursing Facility | Payer: Medicare HMO | Attending: Family Medicine | Admitting: Family Medicine

## 2018-04-01 DIAGNOSIS — E778 Other disorders of glycoprotein metabolism: Secondary | ICD-10-CM | POA: Diagnosis present

## 2018-04-01 DIAGNOSIS — S301XXD Contusion of abdominal wall, subsequent encounter: Secondary | ICD-10-CM

## 2018-04-01 DIAGNOSIS — J80 Acute respiratory distress syndrome: Secondary | ICD-10-CM | POA: Diagnosis not present

## 2018-04-01 DIAGNOSIS — R531 Weakness: Secondary | ICD-10-CM | POA: Diagnosis not present

## 2018-04-01 DIAGNOSIS — E785 Hyperlipidemia, unspecified: Secondary | ICD-10-CM | POA: Diagnosis present

## 2018-04-01 DIAGNOSIS — R10A0 Flank pain, unspecified side: Secondary | ICD-10-CM

## 2018-04-01 DIAGNOSIS — Z9981 Dependence on supplemental oxygen: Secondary | ICD-10-CM | POA: Diagnosis not present

## 2018-04-01 DIAGNOSIS — G4733 Obstructive sleep apnea (adult) (pediatric): Secondary | ICD-10-CM | POA: Diagnosis present

## 2018-04-01 DIAGNOSIS — Z7982 Long term (current) use of aspirin: Secondary | ICD-10-CM

## 2018-04-01 DIAGNOSIS — E039 Hypothyroidism, unspecified: Secondary | ICD-10-CM | POA: Diagnosis present

## 2018-04-01 DIAGNOSIS — I739 Peripheral vascular disease, unspecified: Secondary | ICD-10-CM | POA: Diagnosis present

## 2018-04-01 DIAGNOSIS — Z880 Allergy status to penicillin: Secondary | ICD-10-CM

## 2018-04-01 DIAGNOSIS — R079 Chest pain, unspecified: Secondary | ICD-10-CM | POA: Diagnosis not present

## 2018-04-01 DIAGNOSIS — Z888 Allergy status to other drugs, medicaments and biological substances status: Secondary | ICD-10-CM

## 2018-04-01 DIAGNOSIS — J449 Chronic obstructive pulmonary disease, unspecified: Secondary | ICD-10-CM | POA: Diagnosis present

## 2018-04-01 DIAGNOSIS — I2699 Other pulmonary embolism without acute cor pulmonale: Secondary | ICD-10-CM | POA: Diagnosis present

## 2018-04-01 DIAGNOSIS — Z86718 Personal history of other venous thrombosis and embolism: Secondary | ICD-10-CM | POA: Diagnosis not present

## 2018-04-01 DIAGNOSIS — D500 Iron deficiency anemia secondary to blood loss (chronic): Secondary | ICD-10-CM | POA: Diagnosis not present

## 2018-04-01 DIAGNOSIS — G8929 Other chronic pain: Secondary | ICD-10-CM | POA: Diagnosis present

## 2018-04-01 DIAGNOSIS — R319 Hematuria, unspecified: Secondary | ICD-10-CM | POA: Diagnosis not present

## 2018-04-01 DIAGNOSIS — J9811 Atelectasis: Secondary | ICD-10-CM | POA: Diagnosis present

## 2018-04-01 DIAGNOSIS — R0902 Hypoxemia: Secondary | ICD-10-CM | POA: Diagnosis not present

## 2018-04-01 DIAGNOSIS — I82412 Acute embolism and thrombosis of left femoral vein: Secondary | ICD-10-CM

## 2018-04-01 DIAGNOSIS — Z951 Presence of aortocoronary bypass graft: Secondary | ICD-10-CM | POA: Diagnosis not present

## 2018-04-01 DIAGNOSIS — K219 Gastro-esophageal reflux disease without esophagitis: Secondary | ICD-10-CM | POA: Diagnosis present

## 2018-04-01 DIAGNOSIS — Z86711 Personal history of pulmonary embolism: Secondary | ICD-10-CM

## 2018-04-01 DIAGNOSIS — Z6833 Body mass index (BMI) 33.0-33.9, adult: Secondary | ICD-10-CM | POA: Diagnosis not present

## 2018-04-01 DIAGNOSIS — E669 Obesity, unspecified: Secondary | ICD-10-CM | POA: Diagnosis present

## 2018-04-01 DIAGNOSIS — Z7901 Long term (current) use of anticoagulants: Secondary | ICD-10-CM

## 2018-04-01 DIAGNOSIS — R0602 Shortness of breath: Secondary | ICD-10-CM

## 2018-04-01 DIAGNOSIS — M1990 Unspecified osteoarthritis, unspecified site: Secondary | ICD-10-CM | POA: Diagnosis not present

## 2018-04-01 DIAGNOSIS — Z96641 Presence of right artificial hip joint: Secondary | ICD-10-CM | POA: Diagnosis present

## 2018-04-01 DIAGNOSIS — F319 Bipolar disorder, unspecified: Secondary | ICD-10-CM | POA: Diagnosis present

## 2018-04-01 DIAGNOSIS — Z87891 Personal history of nicotine dependence: Secondary | ICD-10-CM | POA: Diagnosis not present

## 2018-04-01 DIAGNOSIS — I25119 Atherosclerotic heart disease of native coronary artery with unspecified angina pectoris: Secondary | ICD-10-CM | POA: Diagnosis present

## 2018-04-01 DIAGNOSIS — N390 Urinary tract infection, site not specified: Secondary | ICD-10-CM | POA: Diagnosis not present

## 2018-04-01 DIAGNOSIS — N12 Tubulo-interstitial nephritis, not specified as acute or chronic: Secondary | ICD-10-CM | POA: Diagnosis not present

## 2018-04-01 DIAGNOSIS — I509 Heart failure, unspecified: Secondary | ICD-10-CM | POA: Diagnosis not present

## 2018-04-01 DIAGNOSIS — D649 Anemia, unspecified: Secondary | ICD-10-CM | POA: Diagnosis present

## 2018-04-01 DIAGNOSIS — I5043 Acute on chronic combined systolic (congestive) and diastolic (congestive) heart failure: Secondary | ICD-10-CM | POA: Diagnosis present

## 2018-04-01 DIAGNOSIS — M7981 Nontraumatic hematoma of soft tissue: Secondary | ICD-10-CM | POA: Diagnosis present

## 2018-04-01 DIAGNOSIS — R109 Unspecified abdominal pain: Secondary | ICD-10-CM

## 2018-04-01 DIAGNOSIS — I10 Essential (primary) hypertension: Secondary | ICD-10-CM | POA: Diagnosis present

## 2018-04-01 DIAGNOSIS — I5032 Chronic diastolic (congestive) heart failure: Secondary | ICD-10-CM | POA: Diagnosis not present

## 2018-04-01 DIAGNOSIS — I2510 Atherosclerotic heart disease of native coronary artery without angina pectoris: Secondary | ICD-10-CM | POA: Diagnosis present

## 2018-04-01 DIAGNOSIS — R072 Precordial pain: Secondary | ICD-10-CM | POA: Diagnosis not present

## 2018-04-01 DIAGNOSIS — R310 Gross hematuria: Secondary | ICD-10-CM | POA: Diagnosis not present

## 2018-04-01 DIAGNOSIS — I110 Hypertensive heart disease with heart failure: Secondary | ICD-10-CM | POA: Diagnosis present

## 2018-04-01 LAB — I-STAT TROPONIN, ED
Troponin i, poc: 0.01 ng/mL (ref 0.00–0.08)
Troponin i, poc: 0 ng/mL (ref 0.00–0.08)

## 2018-04-01 LAB — CBC WITH DIFFERENTIAL/PLATELET
Basophils Absolute: 0 10*3/uL (ref 0.0–0.1)
Basophils Relative: 0 %
Eosinophils Absolute: 0.1 10*3/uL (ref 0.0–0.7)
Eosinophils Relative: 1 %
HCT: 27.2 % — ABNORMAL LOW (ref 36.0–46.0)
Hemoglobin: 8.3 g/dL — ABNORMAL LOW (ref 12.0–15.0)
Lymphocytes Relative: 20 %
Lymphs Abs: 1.4 10*3/uL (ref 0.7–4.0)
MCH: 28.7 pg (ref 26.0–34.0)
MCHC: 30.5 g/dL (ref 30.0–36.0)
MCV: 94.1 fL (ref 78.0–100.0)
Monocytes Absolute: 0.7 10*3/uL (ref 0.1–1.0)
Monocytes Relative: 11 %
Neutro Abs: 4.8 10*3/uL (ref 1.7–7.7)
Neutrophils Relative %: 68 %
Platelets: 284 10*3/uL (ref 150–400)
RBC: 2.89 MIL/uL — ABNORMAL LOW (ref 3.87–5.11)
RDW: 17 % — ABNORMAL HIGH (ref 11.5–15.5)
WBC: 7 10*3/uL (ref 4.0–10.5)

## 2018-04-01 LAB — MAGNESIUM: Magnesium: 1.8 mg/dL (ref 1.7–2.4)

## 2018-04-01 LAB — COMPREHENSIVE METABOLIC PANEL
ALT: 18 U/L (ref 14–54)
AST: 23 U/L (ref 15–41)
Albumin: 2.2 g/dL — ABNORMAL LOW (ref 3.5–5.0)
Alkaline Phosphatase: 64 U/L (ref 38–126)
Anion gap: 8 (ref 5–15)
BUN: 11 mg/dL (ref 6–20)
CO2: 36 mmol/L — ABNORMAL HIGH (ref 22–32)
Calcium: 8 mg/dL — ABNORMAL LOW (ref 8.9–10.3)
Chloride: 95 mmol/L — ABNORMAL LOW (ref 101–111)
Creatinine, Ser: 0.81 mg/dL (ref 0.44–1.00)
GFR calc Af Amer: >60 mL/min (ref >60)
GFR calc non Af Amer: >60 mL/min (ref >60)
Glucose, Bld: 99 mg/dL (ref 65–99)
Potassium: 3.7 mmol/L (ref 3.5–5.1)
Sodium: 139 mmol/L (ref 135–145)
Total Bilirubin: 0.6 mg/dL (ref 0.3–1.2)
Total Protein: 5 g/dL — ABNORMAL LOW (ref 6.5–8.1)

## 2018-04-01 LAB — URINALYSIS, MICROSCOPIC (REFLEX): Bacteria, UA: NONE SEEN

## 2018-04-01 LAB — URINALYSIS, ROUTINE W REFLEX MICROSCOPIC
Bilirubin Urine: NEGATIVE
Glucose, UA: 50 mg/dL — AB
Ketones, ur: NEGATIVE mg/dL
Leukocytes, UA: NEGATIVE
Nitrite: POSITIVE — AB
Protein, ur: 100 mg/dL — AB
Specific Gravity, Urine: 1.039 — ABNORMAL HIGH (ref 1.005–1.030)
pH: 7 (ref 5.0–8.0)

## 2018-04-01 LAB — BRAIN NATRIURETIC PEPTIDE: B Natriuretic Peptide: 194 pg/mL — ABNORMAL HIGH (ref 0.0–100.0)

## 2018-04-01 LAB — PROTIME-INR
INR: 1.12
Prothrombin Time: 14.3 seconds (ref 11.4–15.2)

## 2018-04-01 LAB — LIPASE, BLOOD: Lipase: 19 U/L (ref 11–51)

## 2018-04-01 MED ORDER — IOPAMIDOL (ISOVUE-370) INJECTION 76%
INTRAVENOUS | Status: AC
  Filled 2018-04-01: qty 100

## 2018-04-01 MED ORDER — ACETAMINOPHEN 325 MG PO TABS
650.0000 mg | ORAL_TABLET | Freq: Once | ORAL | Status: AC
  Administered 2018-04-01: 650 mg via ORAL
  Filled 2018-04-01: qty 2

## 2018-04-01 MED ORDER — AZTREONAM 1 G IJ SOLR
1.0000 g | Freq: Once | INTRAMUSCULAR | Status: AC
  Administered 2018-04-02: 1 g via INTRAVENOUS
  Filled 2018-04-01: qty 1

## 2018-04-01 MED ORDER — OXYCODONE HCL 5 MG PO TABS
10.0000 mg | ORAL_TABLET | Freq: Once | ORAL | Status: AC
  Administered 2018-04-01: 10 mg via ORAL
  Filled 2018-04-01: qty 2

## 2018-04-01 MED ORDER — IOPAMIDOL (ISOVUE-370) INJECTION 76%
100.0000 mL | Freq: Once | INTRAVENOUS | Status: AC | PRN
  Administered 2018-04-01: 100 mL via INTRAVENOUS

## 2018-04-01 NOTE — H&P (Addendum)
PCP:   [name], MD   Chief Complaint:  Hematuria   HPI: This is a 77y/o female resident of [facility] who on the 5th march with a diagnosis of PE. Marland Kitchen She was anticoagulated, did not do well. Anticoagulation stopped and IVC filter placed. Since discharge she noted blood in her urine 1 week ago. It started out light and became darker. She has occasional BOU. She is no longer on blood thinners. She is still taking a baby ASA daily. She reports being a bit lightheaded and dizzy, which is new. She denies fever or chills. She denies diarrhea. She reports some chest, this is typical for her. At its worse her chest discomfort is 7/10, currently it is 6/10. Its the same discomfort she's had since her open heart surgery. She came in because of the hematuria. History provided by patient. Her son is present at bedside.  She also has some SOB, and has increased  Oxygen need. At baseline she uses 2L, now on 4L of oxygen  Review of Systems:  The patient denies anorexia, fever, dizzy, weight loss,, vision loss, decreased hearing, hoarseness, chest pain, syncope, dyspnea on exertion, peripheral edema, balance deficits, hemoptysis, abdominal pain, melena, hematochezia, severe indigestion/heartburn, hematuria, incontinence, genital sores, muscle weakness, suspicious skin lesions, transient blindness, difficulty walking, depression, unusual weight change, abnormal bleeding, enlarged lymph nodes, angioedema, and breast masses.  Past Medical History: Past Medical History:  Diagnosis Date  . Anemia    bld. transfusion post lumbar surgery- 2012  . Anxiety   . Arthralgia    NOS  . Blood transfusion 2011   WITH BACK SURGERY  . CAD (coronary artery disease)    s/p CABG 2004; s/p DES to LM in 2010;  [place] 10/29/11: EF 50-55%, mild elevated filling pressures, no pulmonary HTN, LM 90% ISR, LAD and CFX occluded, S-RI occluded (old), S-OM3 ok and L-LAD ok, native nondominant RCA 95% -  med rx recommended ; Lexiscan  Myoview 7/13 at [facility]: demonstrated "normal LV function, anterior attenuation and localized ischemia, inferior, basilar, mid section"  . Carotid artery occlusion    moderate  . Chronic diastolic heart failure (HCC)    Echo 9/10: EF 96-78%, grade 1 diastolic dysfunction  . COPD (chronic obstructive pulmonary disease) ([place])    Emphysema dxed by Dr. Woody Seller in [place] based on PFTs per pt in 2006; placed on albuterol  . Depression    TAKES CELEXA  AND  (OFF- WELBUTRIN)  . DVT of lower extremity (deep venous thrombosis) (HCC)    recurrent. bilateral (2 episodes)  . Dyspnea   . Dysrhythmia    afib with cabg  . Exertional angina (HCC)    Treated with Isosorbide, Ranexa, amlodipine; intolerant to metoprolol  . GERD (gastroesophageal reflux disease)   . HLD (hyperlipidemia)   . Hypertension   . Hyperthyroidism   . Obesity (BMI 30-39.9) 2009   BMI 33  . Osteoarthritis   . Pneumonia   . PVD (peripheral vascular disease) ([place])   . Sleep apnea 2012   USED CPAP THEN  STARTED USING SPIRIVA , Nov. 2013- last evaluation , changed from mask to aparatus that is just for her nose.Marland Kitchen    Past Surgical History:  Procedure Laterality Date  . ANKLE SURGERY  2004  . [place]   lower; another scheduled, opt. reports 4 back- lumbar, 3 cerv. fusions  for later 2009  . BREAST ENHANCEMENT SURGERY    . CARDIAC CATHETERIZATION  11/2009  Patent LIMA to LAD and patent SVG to OM1. Occluded SVG to ramus and diagonal. Left main: 90% ostial stenosis, LCX 60-70% proximal stenosis  . COLONOSCOPY WITH PROPOFOL N/A 02/17/2017   Procedure: COLONOSCOPY WITH PROPOFOL;  Surgeon: Jerene Bears, MD;  Location: [facility] ENDOSCOPY;  Service: Endoscopy;  Laterality: N/A;  . CORONARY ANGIOPLASTY WITH STENT PLACEMENT  11/2009   Drug eluting stent to left main artery: 4.0 X 12 mm Ion   . CORONARY ARTERY BYPASS GRAFT  2004  . ESOPHAGOGASTRODUODENOSCOPY N/A 02/14/2017   Procedure: ESOPHAGOGASTRODUODENOSCOPY (EGD);   Surgeon: Doran Stabler, MD;  Location: [facility] ENDOSCOPY;  Service: Endoscopy;  Laterality: N/A;  . EYE SURGERY  2006   BILATERAL CAT EXT.   Marland Kitchen GIVENS CAPSULE STUDY N/A 02/15/2017   Procedure: GIVENS CAPSULE STUDY;  Surgeon: Doran Stabler, MD;  Location: [place];  Service: Endoscopy;  Laterality: N/A;  . GROIN MASS OPEN BIOPSY  2004  . IR IVC FILTER PLMT / S&I /IMG GUID/MOD SED  03/14/2018  . IR PARACENTESIS  03/10/2018  . JOINT REPLACEMENT  2012    BILATERAL KNEES  . JOINT REPLACEMENT  2010    RIGHT HIP REPLACEMENT  . NECK SURGERY  12/08   Due for repeat neck surgery  . PERIPHERAL VASCULAR INTERVENTION Left 03/05/2018   Procedure: PERIPHERAL VASCULAR INTERVENTION;  Surgeon: Elam Dutch, MD;  Location: [place] CV LAB;  Service: Cardiovascular;  Laterality: Left;  Attempted unsuccess\ful Per Dr. Eden Lathe  . PERIPHERAL VASCULAR INTERVENTION  03/08/2018   Procedure: PERIPHERAL VASCULAR INTERVENTION;  Surgeon: Waynetta Sandy, MD;  Location: [place] CV LAB;  Service: Cardiovascular;;  SMA Stent   . REVERSE SHOULDER ARTHROPLASTY  02/26/2012   Procedure: REVERSE SHOULDER ARTHROPLASTY;  Surgeon: Marin Shutter, MD;  Location: [place];  Service: Orthopedics;  Laterality: Right;  RIGHT SHOULDER REVERSED ARTHROPLASTY  . SHOULDER ARTHROSCOPY WITH SUBACROMIAL DECOMPRESSION Left 02/10/2013   Procedure: SHOULDER ARTHROSCOPY WITH SUBACROMIAL DECOMPRESSION DISTAL CLAVICLE RESECTION;  Surgeon: Marin Shutter, MD;  Location: [place];  Service: Orthopedics;  Laterality: Left;  DISTAL CLAVICLE RESECTION  . TOTAL HIP ARTHROPLASTY  2007   Right  . VISCERAL ANGIOGRAPHY N/A 03/05/2018   Procedure: VISCERAL ANGIOGRAPHY;  Surgeon: Elam Dutch, MD;  Location: [place] CV LAB;  Service: Cardiovascular;  Laterality: N/A;  . VISCERAL ANGIOGRAPHY N/A 03/08/2018   Procedure: VISCERAL ANGIOGRAPHY;  Surgeon: Waynetta Sandy, MD;  Location: [place] CV LAB;  Service: Cardiovascular;   Laterality: N/A;    Medications: Prior to Admission medications   Medication Sig Start Date End Date Taking? Authorizing Provider  allopurinol (ZYLOPRIM) 100 MG tablet Take 100 mg by mouth daily. 01/30/17  Yes [provider]  aspirin EC 81 MG tablet Take 81 mg by mouth daily.   Yes [provider]  citalopram (CELEXA) 20 MG tablet Take 30 mg by mouth daily.    Yes [provider]  [employer]S D3 1000 units capsule Take 1,000 Units by mouth daily. 01/14/18  Yes [provider]  feeding supplement, ENSURE ENLIVE, (ENSURE ENLIVE) LIQD Take 237 mLs by mouth 3 (three) times daily between meals. 03/26/18  Yes Mikhail, [place], DO  ferrous sulfate 325 (65 FE) MG tablet Take 325 mg by mouth daily with breakfast.   Yes [provider]  furosemide (LASIX) 40 MG tablet Take 1 tablet (40 mg total) by mouth 2 (two) times daily. 03/26/18  Yes Mikhail, Maryann, DO  INCRUSE ELLIPTA 62.5 MCG/INH AEPB Inhale  1 puff into the lungs 2 (two) times daily.  11/30/16  Yes [provider]  Ipratropium-Albuterol (COMBIVENT) 20-100 MCG/ACT AERS respimat Inhale 1 puff into the lungs every 6 (six) hours as needed for wheezing.   Yes [provider]  magnesium oxide (MAG-OX) 400 (241.3 Mg) MG tablet Take 1 tablet (400 mg total) by mouth 2 (two) times daily. 03/26/18  Yes Mikhail, Velta Add[place], DO  nitroGLYCERIN (NITROSTAT) 0.4 MG SL tablet Place 1 tablet (0.4 mg total) under the tongue every 5 (five) minutes as needed for chest pain. For chest pain 04/24/16  Yes Fay Records, MD  NON FORMULARY Give 120 ml of Med Pass by mouth three times a day   Yes [provider]  omeprazole (PRILOSEC) 40 MG capsule Take 40 mg by mouth 2 (two) times daily.    Yes [provider]  Oxycodone HCl 10 MG TABS Take 1 tablet (10 mg total) by mouth every 6 (six) hours as needed. Patient taking differently: Take 10 mg by mouth every 6 (six) hours.  03/26/18  Yes Mikhail, Velta Addison, DO   pilocarpine (PILOCAR) 1 % ophthalmic solution Place 1 drop into both eyes 2 (two) times daily. 11/30/16  Yes [provider]  potassium chloride SA (K-DUR,KLOR-CON) 20 MEQ tablet Take 20 mEq by mouth daily.   Yes [provider]  pramipexole (MIRAPEX) 0.25 MG tablet Take 0.25 mg by mouth at bedtime. 01/14/18  Yes [provider]  roflumilast (DALIRESP) 500 MCG TABS tablet Take 500 mcg by mouth daily.    Yes [provider]  traZODone (DESYREL) 50 MG tablet Take 150 mg by mouth at bedtime.    Yes [provider]    Allergies:   Allergies  Allergen Reactions  . Zolpidem Tartrate Anxiety    CONFUSION   . Pitavastatin Other (See Comments)    Not sure  . Ropinirole Nausea Only    Makes legs jump  . Penicillins Rash    Social History:  reports that she quit smoking about 15 years ago. She quit after 45.00 years of use. She has never used smokeless tobacco. She reports that she does not drink alcohol or use drugs.  Family History: Family History  Problem Relation Age of Onset  . Ovarian cancer Mother   . Cancer Mother   . Lung cancer Father   . COPD Father   . Heart disease Father   . Cancer Unknown   . Heart disease Unknown   . Colitis Unknown   . Heart disease Sister   . Cancer Sister     Physical Exam: Vitals:   04/01/18 2045 04/01/18 2115 04/01/18 2200 04/01/18 2215  BP: (!) 115/45 (!) 120/50 (!) 112/52 (!) 110/57  Pulse: 78 61 81 77  Resp: (!) 25 (!) 23 18 16   Temp:      TempSrc:      SpO2: 97% 99% 98% 96%  Weight:      Height:        General:  Alert and oriented times three, well developed and nourished, no acute distress Eyes: PERRLA, pink conjunctiva, no scleral icterus ENT: Moist oral mucosa, neck supple, no thyromegaly Lungs: clear to ascultation, no wheeze, no crackles, no use of accessory muscles Cardiovascular: irregular rate and regular  rhythm, no regurgitation, no gallops, no murmurs. No carotid bruits, no  JVD Abdomen: soft, positive BS, non-tender, non-distended, firm in LUQ,  organomegaly, not an acute abdomen GU: swollen edematous labia Neuro: CN II - XII grossly  intact, sensation intact Musculoskeletal: strength 5/5 all extremities, no clubbing, cyanosis or>3+ B/L LE pitting all the way to groin edema Skin: no rash, no subcutaneous crepitation, no decubitus Psych: appropriate patient   Labs on Admission:  Recent Labs    04/01/18 1541  NA 139  K 3.7  CL 95*  CO2 36*  GLUCOSE 99  BUN 11  CREATININE 0.81  CALCIUM 8.0*  MG 1.8   Recent Labs    04/01/18 1541  AST 23  ALT 18  ALKPHOS 64  BILITOT 0.6  PROT 5.0*  ALBUMIN 2.2*   Recent Labs    04/01/18 1541  LIPASE 19   Recent Labs    04/01/18 1541  WBC 7.0  NEUTROABS 4.8  HGB 8.3*  HCT 27.2*  MCV 94.1  PLT 284   No results for input(s): CKTOTAL, CKMB, CKMBINDEX, TROPONINI in the last 72 hours. Invalid input(s): POCBNP No results for input(s): DDIMER in the last 72 hours. No results for input(s): HGBA1C in the last 72 hours. No results for input(s): CHOL, HDL, LDLCALC, TRIG, CHOLHDL, LDLDIRECT in the last 72 hours. No results for input(s): TSH, T4TOTAL, T3FREE, THYROIDAB in the last 72 hours.  Invalid input(s): FREET3 No results for input(s): VITAMINB12, FOLATE, FERRITIN, TIBC, IRON, RETICCTPCT in the last 72 hours.  Micro Results: No results found for this or any previous visit (from the past 240 hour(s)).   Radiological Exams on Admission: Dg Chest 2 View  Result Date: 04/01/2018 CLINICAL DATA:  Chest pain and shortness of breath EXAM: CHEST - 2 VIEW COMPARISON:  03/18/2018, 03/15/2018, 03/13/2018 FINDINGS: Status post right shoulder replacement. Post sternotomy changes. Calcified breast implants. Small bilateral pleural effusions. Cardiomegaly with vascular congestion and mild diffuse interstitial opacity consistent with edema. Apical scarring. IMPRESSION: 1. Cardiomegaly with vascular congestion and mild  interstitial pulmonary edema and small pleural effusions. Electronically Signed   By: Donavan Foil M.D.   On: 04/01/2018 17:08   Ct Angio Chest Pe W And/or Wo Contrast  Result Date: 04/01/2018 CLINICAL DATA:  Increased shortness of breath. Recently admitted for pulmonary embolism. Patient also complains of hematuria. EXAM: CT ANGIOGRAPHY CHEST CT ABDOMEN AND PELVIS WITH CONTRAST TECHNIQUE: Multidetector CT imaging of the chest was performed using the standard protocol during bolus administration of intravenous contrast. Multiplanar CT image reconstructions and MIPs were obtained to evaluate the vascular anatomy. Multidetector CT imaging of the abdomen and pelvis was performed using the standard protocol during bolus administration of intravenous contrast. CONTRAST:  148mL ISOVUE-370 IOPAMIDOL (ISOVUE-370) INJECTION 76% COMPARISON:  CT abdomen pelvis dated March 14, 2018. CT chest dated February 28, 2018. FINDINGS: CTA CHEST FINDINGS Cardiovascular: Satisfactory opacification of the pulmonary arteries to the segmental level. No evidence of pulmonary embolism. Previously seen pulmonary emboli in the left lower lobe have resolved. Mild left atrial enlargement. No pericardial effusion. Normal caliber thoracic aorta. Coronary, aortic arch, and branch vessel atherosclerotic vascular disease. Mediastinum/Nodes: No enlarged mediastinal, hilar, or axillary lymph nodes. Thyroid gland, trachea, and esophagus demonstrate no significant findings. Lungs/Pleura: Unchanged moderate right and trace left pleural effusions. Right greater than left lower lobe subsegmental atelectasis. No consolidation or pneumothorax. Unchanged biapical pleuroparenchymal scarring. No suspicious pulmonary nodule. Musculoskeletal: Calcified bilateral breast implants. No acute or significant osseous findings. Review of the MIP images confirms the above findings. CT ABDOMEN and PELVIS FINDINGS Hepatobiliary: No focal liver abnormality is seen. Status  post cholecystectomy. No biliary dilatation. Pancreas: Severely atrophic. No surrounding inflammatory changes or ductal dilatation. Spleen: Normal in size  without focal abnormality. Adrenals/Urinary Tract: The adrenal glands are unremarkable. Small bilateral renal cysts are unchanged. No renal or ureteral calculi. No hydronephrosis. The bladder is unremarkable. Stomach/Bowel: Small hiatal hernia. The stomach is otherwise within normal limits. Normal appendix. No bowel wall thickening, distention, or surrounding inflammatory changes. Colonic diverticulosis. Vascular/Lymphatic: Interval placement of an IVC filter. Unchanged diffuse aortic and branch vessel atherosclerotic vascular calcifications. No enlarged abdominal or pelvic lymph nodes. Reproductive: No adnexal mass. Other: No significant interval change in size of the large anterior abdominal wall hematoma also involving the space of Retzius, now measuring 11.0 x 16.7 x 12.6 cm, previously 11.9 x 16.9 x 12.4 cm. No free fluid or pneumoperitoneum. Musculoskeletal: No acute or significant osseous findings. Prior right total hip arthroplasty and lumbar spine posterior decompression and fusion. Review of the MIP images confirms the above findings. IMPRESSION: 1. No evidence of pulmonary embolism. Interval resolution of previously seen left lower lobe pulmonary emboli. 2. Unchanged moderate right and trace left pleural effusions with adjacent bibasilar atelectasis. 3. Relatively unchanged large anterior abdominal wall hematoma involving the space of Retzius, measuring 11.0 x 16.7 x 12.6 cm. 4.  Aortic atherosclerosis (ICD10-I70.0). Electronically Signed   By: Titus Dubin M.D.   On: 04/01/2018 19:07   Ct Abdomen Pelvis W Contrast  Result Date: 04/01/2018 CLINICAL DATA:  Increased shortness of breath. Recently admitted for pulmonary embolism. Patient also complains of hematuria. EXAM: CT ANGIOGRAPHY CHEST CT ABDOMEN AND PELVIS WITH CONTRAST TECHNIQUE:  Multidetector CT imaging of the chest was performed using the standard protocol during bolus administration of intravenous contrast. Multiplanar CT image reconstructions and MIPs were obtained to evaluate the vascular anatomy. Multidetector CT imaging of the abdomen and pelvis was performed using the standard protocol during bolus administration of intravenous contrast. CONTRAST:  155mL ISOVUE-370 IOPAMIDOL (ISOVUE-370) INJECTION 76% COMPARISON:  CT abdomen pelvis dated March 14, 2018. CT chest dated February 28, 2018. FINDINGS: CTA CHEST FINDINGS Cardiovascular: Satisfactory opacification of the pulmonary arteries to the segmental level. No evidence of pulmonary embolism. Previously seen pulmonary emboli in the left lower lobe have resolved. Mild left atrial enlargement. No pericardial effusion. Normal caliber thoracic aorta. Coronary, aortic arch, and branch vessel atherosclerotic vascular disease. Mediastinum/Nodes: No enlarged mediastinal, hilar, or axillary lymph nodes. Thyroid gland, trachea, and esophagus demonstrate no significant findings. Lungs/Pleura: Unchanged moderate right and trace left pleural effusions. Right greater than left lower lobe subsegmental atelectasis. No consolidation or pneumothorax. Unchanged biapical pleuroparenchymal scarring. No suspicious pulmonary nodule. Musculoskeletal: Calcified bilateral breast implants. No acute or significant osseous findings. Review of the MIP images confirms the above findings. CT ABDOMEN and PELVIS FINDINGS Hepatobiliary: No focal liver abnormality is seen. Status post cholecystectomy. No biliary dilatation. Pancreas: Severely atrophic. No surrounding inflammatory changes or ductal dilatation. Spleen: Normal in size without focal abnormality. Adrenals/Urinary Tract: The adrenal glands are unremarkable. Small bilateral renal cysts are unchanged. No renal or ureteral calculi. No hydronephrosis. The bladder is unremarkable. Stomach/Bowel: Small hiatal hernia.  The stomach is otherwise within normal limits. Normal appendix. No bowel wall thickening, distention, or surrounding inflammatory changes. Colonic diverticulosis. Vascular/Lymphatic: Interval placement of an IVC filter. Unchanged diffuse aortic and branch vessel atherosclerotic vascular calcifications. No enlarged abdominal or pelvic lymph nodes. Reproductive: No adnexal mass. Other: No significant interval change in size of the large anterior abdominal wall hematoma also involving the space of Retzius, now measuring 11.0 x 16.7 x 12.6 cm, previously 11.9 x 16.9 x 12.4 cm. No free fluid or pneumoperitoneum. Musculoskeletal: No  acute or significant osseous findings. Prior right total hip arthroplasty and lumbar spine posterior decompression and fusion. Review of the MIP images confirms the above findings. IMPRESSION: 1. No evidence of pulmonary embolism. Interval resolution of previously seen left lower lobe pulmonary emboli. 2. Unchanged moderate right and trace left pleural effusions with adjacent bibasilar atelectasis. 3. Relatively unchanged large anterior abdominal wall hematoma involving the space of Retzius, measuring 11.0 x 16.7 x 12.6 cm. 4.  Aortic atherosclerosis (ICD10-I70.0). Electronically Signed   By: Titus Dubin M.D.   On: 04/01/2018 19:07    Assessment/Plan Present on Admission: . Hematuria -admit to [facility] -urology consulted by ER physician. May benefit from cystoscopy but patient with CHF, EF 55% -treating possible UTI but doubtful, no leukocytes or bacteria in UA, await culture results -serial h/h  q8 -purewick, no clots noted -ASA d/ced   . Acute lower UTI -UA pending. Aztreonam ordered as patient was just recently hospitalized  Acute diastolic CHF, EF 33% -IV lasix, daily weights, strict I/O's, oxygen, duonebs  Anemia -secondary to prior hematoma, patient does c/o some dizziness which she states is new. May need a transfusion but not currently ordered at she has some CHF  and is fluid overloaded.  -anemia panel and  type and screen ordered  . CAD (coronary artery disease) -stable, home meds resumed except ASA  . COPD (chronic obstructive pulmonary disease) (HCC) -stable, home meds  Chronic resdpiratory failure -oxygen ordered  OSA -CPAP ordered  . Dyslipidemia -stable, home meds  Chronic pain -secondary to arthritis, in back and hip -home meds resumed  . Essential hypertension -stable, home meds with hold parameters  . Hypothyroidism -stable, home meds  . Pulmonary embolism ([place]) -resolved on current CTA   abdominal wall hematoma -unchanged monitor.  Zimere Dunlevy 04/01/2018, 11:49 PM

## 2018-04-01 NOTE — ED Notes (Signed)
Patient transported to CT 

## 2018-04-01 NOTE — ED Notes (Signed)
Pt has bruise on left flank area, denies fall, reports increasing belly pain over past couple of days with increasing edema in legs bilaterally.

## 2018-04-01 NOTE — ED Triage Notes (Signed)
Pt arrived via [facility] from [facility] with c/o increased SOB. Pt had recent d/c on the 5th for PE. And is on 2L [place]. EMS found pt to be at 88% on 2L placed pt on 4L [place] and brought O2 to 95%. Pt reports increasing SOB and feeling like her hear is racing. Pt also c/o hematuria. Denies pain upon arrival A&OX4.

## 2018-04-01 NOTE — Progress Notes (Addendum)
Location:   [facility] Room Number: 321/P Place of Service:  SNF [phone]) Provider:  Mosie Epstein, MD  Patient Care Team: [name], [name], MD as PCP - General (Specialist) Fay Records, MD as PCP - Cardiology (Cardiology) Kary Kos, MD as Consulting Physician (Neurosurgery)  Extended Emergency Contact Information Primary Emergency Contact: [place] Phone: [phone] Relation: Son Secondary Emergency Contact: Awanda Mink [place] Mobile Phone: [phone] Relation: Daughter  Code Status:  Full Code Goals of care: Advanced Directive information Advanced Directives 03/30/2018  Does Patient Have a Medical Advance Directive? Yes  Type of Advance Directive (No Data)  Does patient want to make changes to medical advance directive? No - Patient declined  Copy of [place] in Chart? No - copy requested  Would patient like information on creating a medical advance directive? No - Patient declined  Pre-existing out of facility DNR order (yellow form or pink MOST form) -    Chief complaint-acute visit follow-up hematuria as well as shortness of breath     HPI:  Pt is a 77 y.o. female seen today for an acute visit for complaints of hematuria as well as shortness of breath.  Patient has a history of COPD as well as diastolic CHF COPD on chronic oxygenAs well as prior DVT not on anticoagulation secondary to GI bleed.  She initially presented to the ED with nausea shortness of breath found to have a subacute pulmonary embolism with a large acute DVT in the left femoral vein.  She is also found to have severe stenosis of the SMA.  She was started on IV heparin and had stenting of the SMA.  She had worsening any nausea and vomiting and had CT of the abdomen which showed ascites and colonic wall thickness.  She had acute respiratory failure wrong with a drop in her  hemoglobin.  Repeat CT scan showed a large anterior abdominal wall hematoma Plavix and aspirin were stopped she had an IVC filter placed--- she has been restarted on aspirin.  She also had acute renal injury which resolved.  Clinically she appeared to improve and was discharged to skilled nursing for therapy.  --- It was noted by nursing staff today that she had some what appear to be hematuria.  She is also complaining of some increased shortness of breath and weakness.  She does not complain of acute chest pain discomfort and O2 stats are in the 90s on chronic oxygen.  She continues on Combivent as well as Daliresp  And Incruse Ellipta   In regards to diastolic CHF she continues on Lasix 40 mg p.o. twice daily.        Past Medical History:  Diagnosis Date  . Anemia    bld. transfusion post lumbar surgery- 2012  . Anxiety   . Arthralgia    NOS  . Blood transfusion 2011   WITH BACK SURGERY  . CAD (coronary artery disease)    s/p CABG 2004; s/p DES to LM in 2010;  [facility] 10/29/11: EF 50-55%, mild elevated filling pressures, no pulmonary HTN, LM 90% ISR, LAD and CFX occluded, S-RI occluded (old), S-OM3 ok and L-LAD ok, native nondominant RCA 95% -  med rx recommended ; Lexiscan Myoview 7/13 at [facility]: demonstrated "normal LV function, anterior attenuation and localized ischemia, inferior, basilar, mid section"  . Carotid artery occlusion    moderate  . Chronic diastolic  heart failure ([place])    Echo 9/10: EF 69-62%, grade 1 diastolic dysfunction  . COPD (chronic obstructive pulmonary disease) ([place])    Emphysema dxed by Dr. Woody Seller in [place] based on PFTs per pt in 2006; placed on albuterol  . Depression    TAKES CELEXA  AND  (OFF- WELBUTRIN)  . DVT of lower extremity (deep venous thrombosis) ([facility])    recurrent. bilateral (2 episodes)  . Dyspnea   . Dysrhythmia    afib with cabg  . Exertional angina ([facility])    Treated with Isosorbide, Ranexa, amlodipine; intolerant to  metoprolol  . GERD (gastroesophageal reflux disease)   . HLD (hyperlipidemia)   . Hypertension   . Hyperthyroidism   . Obesity (BMI 30-39.9) 2009   BMI 33  . Osteoarthritis   . Pneumonia   . PVD (peripheral vascular disease) ([place])   . Sleep apnea 2012   USED CPAP THEN  STARTED USING SPIRIVA , Nov. 2013- last evaluation , changed from mask to aparatus that is just for her nose.Marland Kitchen    Past Surgical History:  Procedure Laterality Date  . ANKLE SURGERY  2004  . [place]   lower; another scheduled, opt. reports 4 back- lumbar, 3 cerv. fusions  for later 2009  . BREAST ENHANCEMENT SURGERY    . CARDIAC CATHETERIZATION  11/2009   Patent LIMA to LAD and patent SVG to OM1. Occluded SVG to ramus and diagonal. Left main: 90% ostial stenosis, LCX 60-70% proximal stenosis  . COLONOSCOPY WITH PROPOFOL N/A 02/17/2017   Procedure: COLONOSCOPY WITH PROPOFOL;  Surgeon: Jerene Bears, MD;  Location: [facility] ENDOSCOPY;  Service: Endoscopy;  Laterality: N/A;  . CORONARY ANGIOPLASTY WITH STENT PLACEMENT  11/2009   Drug eluting stent to left main artery: 4.0 X 12 mm Ion   . CORONARY ARTERY BYPASS GRAFT  2004  . ESOPHAGOGASTRODUODENOSCOPY N/A 02/14/2017   Procedure: ESOPHAGOGASTRODUODENOSCOPY (EGD);  Surgeon: Doran Stabler, MD;  Location[facility] ENDOSCOPY;  Service: Endoscopy;  Laterality: N/A;  . EYE SURGERY  2006   BILATERAL CAT EXT.   Marland Kitchen GIVENS CAPSULE STUDY N/A 02/15/2017   Procedure: GIVENS CAPSULE STUDY;  Surgeon: Doran Stabler, MD;  Location: [facility];  Service: Endoscopy;  Laterality: N/A;  . GROIN MASS OPEN BIOPSY  2004  . IR IVC FILTER PLMT / S&I /IMG GUID/MOD SED  03/14/2018  . IR PARACENTESIS  03/10/2018  . JOINT REPLACEMENT  2012    BILATERAL KNEES  . JOINT REPLACEMENT  2010    RIGHT HIP REPLACEMENT  . NECK SURGERY  12/08   Due for repeat neck surgery  . PERIPHERAL VASCULAR INTERVENTION Left 03/05/2018   Procedure: PERIPHERAL VASCULAR INTERVENTION;  Surgeon: Elam Dutch, MD;   Location: [facility] CV LAB;  Service: Cardiovascular;  Laterality: Left;  Attempted unsuccess\ful Per Dr. Eden Lathe  . PERIPHERAL VASCULAR INTERVENTION  03/08/2018   Procedure: PERIPHERAL VASCULAR INTERVENTION;  Surgeon: Waynetta Sandy, MD;  Location: [place] CV LAB;  Service: Cardiovascular;;  SMA Stent   . REVERSE SHOULDER ARTHROPLASTY  02/26/2012   Procedure: REVERSE SHOULDER ARTHROPLASTY;  Surgeon: Marin Shutter, MD;  Location: [facility];  Service: Orthopedics;  Laterality: Right;  RIGHT SHOULDER REVERSED ARTHROPLASTY  . SHOULDER ARTHROSCOPY WITH SUBACROMIAL DECOMPRESSION Left 02/10/2013   Procedure: SHOULDER ARTHROSCOPY WITH SUBACROMIAL DECOMPRESSION DISTAL CLAVICLE RESECTION;  Surgeon: Marin Shutter, MD;  Location: [facility] [place];  Service: Orthopedics;  Laterality: Left;  DISTAL CLAVICLE RESECTION  . TOTAL HIP ARTHROPLASTY  2007   Right  . VISCERAL ANGIOGRAPHY N/A 03/05/2018   Procedure: VISCERAL ANGIOGRAPHY;  Surgeon: Elam Dutch, MD;  Location: [facility] CV LAB;  Service: Cardiovascular;  Laterality: N/A;  . VISCERAL ANGIOGRAPHY N/A 03/08/2018   Procedure: VISCERAL ANGIOGRAPHY;  Surgeon: Waynetta Sandy, MD;  Location: [place] [facility] CV LAB;  Service: Cardiovascular;  Laterality: N/A;    Allergies  Allergen Reactions  . Zolpidem Tartrate Anxiety    CONFUSION   . Pitavastatin Other (See Comments)    Not sure  . Ropinirole Nausea Only    Makes legs jump  . Penicillins Rash    Outpatient Encounter Medications as of 04/01/2018  Medication Sig  . allopurinol (ZYLOPRIM) 100 MG tablet Take 100 mg by mouth daily.  Marland Kitchen aspirin EC 81 MG tablet Take 81 mg by mouth daily.  . citalopram (CELEXA) 20 MG tablet Take 30 mg by mouth daily.   . CVS D3 1000 units capsule Take 1,000 Units by mouth daily.  . feeding supplement, ENSURE ENLIVE, (ENSURE ENLIVE) LIQD Take 237 mLs by mouth 3 (three) times daily between meals.  . ferrous sulfate 325 (65 FE) MG tablet Take 325 mg by mouth daily  with breakfast.  . furosemide (LASIX) 40 MG tablet Take 1 tablet (40 mg total) by mouth 2 (two) times daily.  . INCRUSE ELLIPTA 62.5 MCG/INH AEPB Inhale 1 puff into the lungs 2 (two) times daily.   . Ipratropium-Albuterol (COMBIVENT) 20-100 MCG/ACT AERS respimat Inhale 1 puff into the lungs every 6 (six) hours as needed for wheezing.  . magnesium oxide (MAG-OX) 400 (241.3 Mg) MG tablet Take 1 tablet (400 mg total) by mouth 2 (two) times daily.  . nitroGLYCERIN (NITROSTAT) 0.4 MG SL tablet Place 1 tablet (0.4 mg total) under the tongue every 5 (five) minutes as needed for chest pain. For chest pain  . NON FORMULARY Give 120 ml of Med Pass by mouth three times a day  . omeprazole (PRILOSEC) 40 MG capsule Take 40 mg by mouth 2 (two) times daily.   . Oxycodone HCl 10 MG TABS Take 1 tablet (10 mg total) by mouth every 6 (six) hours as needed.  . pilocarpine (PILOCAR) 1 % ophthalmic solution Place 1 drop into both eyes 2 (two) times daily.  . potassium chloride SA (K-DUR,KLOR-CON) 20 MEQ tablet Take 20 mEq by mouth daily.  . pramipexole (MIRAPEX) 0.25 MG tablet Take 0.25 mg by mouth at bedtime.  . roflumilast (DALIRESP) 500 MCG TABS tablet Take 500 mcg by mouth daily.   . traZODone (DESYREL) 50 MG tablet Take 150 mg by mouth at bedtime.    No facility-administered encounter medications on file as of 04/01/2018.     Review of Systems   In general she is not complaining of fever chills but does complain of increased weakness.  Skin is not complain of rashes itching or diaphoresis.  Head ears eyes nose mouth and throat is not complaining of sore throat or visual changes.  Respiratory is positive for shortness of breath does not really complain of cough.  Cardiac does not complain of chest pain does have significant lower extremity edema.  GI does have a history of abdominal distention and complains of some  abdominal discomfort especially with palpation.  GU again hematuria has been noted she  has complained of some burning with urination.  Musculoskeletal does have a history of diffuse myalgias and back discomfort as well as weakness.  Neurologic does complain of some increased weakness does not  complain of headache or syncopal feelings.  Psych does not complain of overt depression or anxiety says she just does not feel well      There is no immunization history on file for this patient. Pertinent  Health Maintenance Due  Topic Date Due  . DEXA SCAN  05/03/2018 (Originally 07/25/2005)  . INFLUENZA VACCINE  07/22/2018  . PNA vac Low Risk Adult  Discontinued   Fall Risk  12/07/2017 11/04/2017 10/12/2017 06/26/2017 05/26/2017  Falls in the past year? No No No No No  Comment - - - - -  Risk for fall due to : - - Impaired mobility - -   Functional Status Survey:    Vitals:   04/01/18 1229  BP: 106/68  Pulse: 81  Resp: 20  Temp: 99.1 F (37.3 C)  TempSrc: Oral  SpO2: 96%   Temperature is 98.5 axillary pulse is 105-respirations 18-blood pressure 117/62-O2 saturation is in the low 90s on 2 L oxygen  Physical Exam   In general this is a pleasant elderly female--who does not appear to be in any acute distress but does appear somewhat uncomfortable.  Her skin is warm and dry.  Eyes visual acuity appears grossly intact.  Oropharynx is clear.  Chest she has shallow air entry with some mild labored breathing --could not really appreciate overt congestion.   heart is regular rhythm somewhat tachycardic I got a pulse between 100-110-she has I would say 2+ edema bilaterally.  Abdomen is somewhat obese soft moderately tender to palpation more so on the left side bowel sounds are positive.  GU could not appreciate any active discharge but she does appear to have some  dried maroon colored discharge in her diaper.  Musculoskeletal Limited exam since she is in bed appears to be able to move all 4 extremities with some lower extremity weakness possibly a bit more on the left  versus the right.  Neurologic as noted above her speech is clear she is alert and oriented.  Psych she appears alert and oriented pleasant appropriate but does not appear to be feeling  well    Labs reviewed: Recent Labs    03/19/18 0300 03/20/18 0714 03/21/18 0324  03/24/18 0342 03/25/18 0349 03/25/18 2039 03/26/18 0342 03/26/18 0826  NA 136 139 138   < > 140  --  138  --  139  K 3.7 3.7 3.7   < > 3.8  --  3.6  --  3.4*  CL 108 107 107   < > 101  --  101  --  102  CO2 20* 24 25   < > 30  --  27  --  30  GLUCOSE 107* 100* 126*   < > 104*  --  126*  --  108*  BUN 35* 25* 21*   < > 16  --  16  --  16  CREATININE 0.99 0.74 0.72   < > 0.76  --  0.72  --  0.70  CALCIUM 7.9* 8.0* 8.1*   < > 8.1*  --  7.9*  --  7.9*  MG 2.3 1.8 1.6*   < > 1.4* 1.4* 1.5* 1.5*  --   PHOS 2.9 3.1 2.7  --   --   --   --   --   --    < > = values in this interval not displayed.   Recent Labs    02/28/18 1924 03/11/18 0337  AST 15 21  ALT 10* 19  ALKPHOS 85 61  BILITOT 0.3 0.6  PROT 6.4* 5.0*  ALBUMIN 3.3* 2.6*   Recent Labs    03/17/18 0430 03/17/18 0757 03/18/18 0318  03/22/18 0653 03/23/18 0407 03/24/18 0342  03/25/18 1036 03/25/18 2039 03/26/18 0342  WBC 15.9* 16.6* 15.6*   < > 12.7* 12.8* 10.1  --   --   --   --   NEUTROABS 13.0* 13.6* 12.9*  --   --   --   --   --   --   --   --   HGB 7.7* 7.8* 7.9*   < > 8.4* 8.5* 7.8*   < > 7.5* 8.2* 8.2*  HCT 23.2* 23.6* 24.2*   < > 27.5* 27.6* 25.2*   < > 24.7* 27.1* 27.7*  MCV 87.9 87.7 89.3   < > 93.9 94.2 93.7  --   --   --   --   PLT 125* 119* 120*   < > 190 201 192  --   --   --   --    < > = values in this interval not displayed.   Lab Results  Component Value Date   TSH 10.230 (H) 08/17/2017   No results found for: HGBA1C Lab Results  Component Value Date   CHOL 103 02/06/2017   HDL 51 02/06/2017   LDLCALC 38 02/06/2017   TRIG 68 02/06/2017   CHOLHDL 2.0 02/06/2017    Significant Diagnostic Results in last 30 days:  Ct  Abdomen Pelvis Wo Contrast  Addendum Date: 03/14/2018   ADDENDUM REPORT: 03/14/2018 04:14 ADDENDUM: These results were called by telephone at the time of interpretation on 03/14/2018 at 4:04 am to [facility] , who verbally acknowledged these results. Electronically Signed   By: Ulyses Jarred M.D.   On: 03/14/2018 04:14   Result Date: 03/14/2018 CLINICAL DATA:  Abdominal pain and fever EXAM: CT ABDOMEN AND PELVIS WITHOUT CONTRAST TECHNIQUE: Multidetector CT imaging of the abdomen and pelvis was performed following the standard protocol without IV contrast. COMPARISON:  CT abdomen pelvis 03/09/2018 FINDINGS: Examination is degraded by streak artifact from external patient devices and from the arms down positioning of the patient, creating multiple streak artifacts and decreasing sensitivity and specificity of this scan. Lower chest: Right pleural effusion and associated atelectasis. Hepatobiliary: Right upper quadrant ascites. Nodular appearance of the liver. No focal liver lesion. Status post cholecystectomy. Pancreas: Atrophic appearance of the pancreas. Spleen: Normal. Adrenals/Urinary Tract: --Adrenal glands: Normal. --Right kidney/ureter: No hydronephrosis, perinephric stranding or nephrolithiasis. No obstructing ureteral stones. --Left kidney/ureter: No hydronephrosis, perinephric stranding or nephrolithiasis. No obstructing ureteral stones. --Urinary bladder: Normal appearance for the degree of distention. Stomach/Bowel: --Stomach/Duodenum: No hiatal hernia or other gastric abnormality. Normal duodenal course. --Small bowel: No dilatation or inflammation. --Colon: Diffuse colonic diverticulosis without acute inflammation. --Appendix: Normal. Vascular/Lymphatic: Atherosclerotic calcification is present within the non-aneurysmal abdominal aorta, without hemodynamically significant stenosis. No abdominal or pelvic lymphadenopathy. Reproductive: Pelvis largely obscured by streak artifact. Musculoskeletal.  Right total hip arthroplasty. L1-L3 posterior spinal fusion. Other: Large anterior abdominal wall hematoma measuring up to 16.9 x 11.9 x 12.4 cm. This is new compared to the recent study from 03/09/2018. This exerts mass effect on the intra-abdominal contents. IMPRESSION: 1. New, large anterior abdominal wall hematoma measuring 16.9 x 11.9 x 12.4 cm. 2. Small volume ascites with nodular appearance of the liver concerning for cirrhosis. 3. Medium-sized right pleural effusion with associated atelectasis. 4. Diffuse colonic diverticulosis without acute inflammation. 5.  Aortic Atherosclerosis (  ICD10-I70.0). Electronically Signed: By: Ulyses Jarred M.D. On: 03/14/2018 03:45   Ir Ivc Filter Plmt / S&i /img Guid/mod Sed  Result Date: 03/14/2018 INDICATION: 77 year old with history of pulmonary embolism and left lower extremity DVT. Patient now has a large abdominal wall hematoma and no longer candidate for anticoagulation. EXAM: IVC FILTER PLACEMENT; IVC VENOGRAM; ULTRASOUND FOR VASCULAR ACCESS Physician: Stephan Minister. Anselm Pancoast, MD MEDICATIONS: None. ANESTHESIA/SEDATION: Fentanyl 25 mcg IV; Versed 0.5 mg IV Moderate Sedation Time:  22 minutes The patient was continuously monitored during the procedure by the interventional radiology nurse under my direct supervision. CONTRAST:  Carbon dioxide FLUOROSCOPY TIME:  Fluoroscopy Time: 7 minutes 6 seconds (327 mGy). COMPLICATIONS: None immediate. PROCEDURE: Informed consent was obtained for an IVC venogram and filter placement. Ultrasound demonstrated a patent right internal jugular vein. Ultrasound images were obtained for documentation. The right side of the neck was prepped and draped in a sterile fashion. Maximal barrier sterile technique was utilized including caps, mask, sterile gowns, sterile gloves, sterile drape, hand hygiene and skin antiseptic. The skin was anesthetized with 1% lidocaine. A 21 gauge needle was directed into the vein with ultrasound guidance and a  micropuncture dilator set was placed. A wire was advanced into the IVC. The filter sheath was advanced over the wire into the IVC. An IVC venogram was performed with carbon dioxide. Limited evaluation of the IVC due to carbon dioxide and the lumbar surgical hardware. Therefore, a MPA catheter was used to cannulate bilateral renal veins. Renal venography was also performed with carbon dioxide bilaterally. The location of the renal veins were confidently identified. Fluoroscopic images were obtained for documentation. A Bard Denali filter was deployed in the IVC below the lowest renal vein. Follow-up images were obtained to confirm adequate deployment of the filter. Vascular sheath was removed with manual compression. FINDINGS: IVC was patent. Bilateral renal veins were identified. The filter was deployed below the lowest renal vein. IMPRESSION: Successful placement of a retrievable IVC filter. PLAN: This IVC filter is potentially retrievable. The patient will be assessed for filter retrieval by Interventional Radiology in approximately 8-12 weeks. Further recommendations regarding filter retrieval, continued surveillance or declaration of device permanence, will be made at that time. Electronically Signed   By: Markus Daft M.D.   On: 03/14/2018 13:03   US Renal  Result Date: 03/15/2018 CLINICAL DATA:  Acute kidney injury. EXAM: RENAL / URINARY TRACT ULTRASOUND COMPLETE COMPARISON:  CT, 03/14/2018. FINDINGS: Right Kidney: Length: 8.6 cm. Diffuse cortical thinning. Increased renal parenchymal echogenicity. There is a 2.6 x 2.0 x 2.3 cm cyst arising from the lower pole. No other masses, no stones and no hydronephrosis. Left Kidney: Length: 9.2 cm. Mild diffuse cortical thinning. Borderline increased renal parenchymal echogenicity. No mass, stone or hydronephrosis. Lower pole partly obscured by overlying bowel gas. Bladder: Decompressed with a Foley catheter.  Not well assessed. Patient also has ascites and a right  pleural effusion. IMPRESSION: 1. Findings consistent with medical renal disease with renal cortical thinning, greater on the right, and increased renal parenchymal echogenicity on the right and borderline increased renal parenchymal echogenicity on the left. 2. No acute findings.  No hydronephrosis. 3. 2.6 cm right lower pole renal cyst. Electronically Signed   By: Lajean Manes M.D.   On: 03/15/2018 15:45   Dg Chest Port 1 View  Result Date: 03/18/2018 CLINICAL DATA:  Dyspnea, bilateral leg and arm swelling, abdominal pain and nausea for the past 3-4 days. History of COPD, CHF, and previous CABG. EXAM:  PORTABLE CHEST 1 VIEW COMPARISON:  Portable chest x-ray of March 15, 2018 FINDINGS: The lungs are well-expanded. There is no focal infiltrate. There is no pleural effusion. The cardiac silhouette is enlarged. The pulmonary vascularity is not engorged. There are post CABG changes. There is calcification in the wall of the thoracic aorta. There are bilateral breast implants exhibiting capsular calcification. IMPRESSION: COPD. Mild cardiomegaly. Previous CABG. No pulmonary edema, pneumonia, or pleural effusion. Thoracic aortic atherosclerosis. Electronically Signed   By: David  Martinique M.D.   On: 03/18/2018 09:15   Dg Chest Port 1 View  Result Date: 03/15/2018 CLINICAL DATA:  Pleural effusion. EXAM: PORTABLE CHEST 1 VIEW COMPARISON:  Radiograph of March 13, 2018. FINDINGS: The heart size and mediastinal contours are within normal limits. Atherosclerosis of thoracic aorta is noted. Status post coronary artery bypass graft. No pneumothorax is noted. Increased right basilar opacity is noted concerning for worsening atelectasis or infiltrate with associated pleural effusion. Status post right shoulder arthroplasty. IMPRESSION: Increased right basilar atelectasis or infiltrate is noted with possible associated pleural effusion. Aortic Atherosclerosis (ICD10-I70.0). Electronically Signed   By: Marijo Conception, M.D.    On: 03/15/2018 07:45   Dg Chest Port 1 View  Result Date: 03/13/2018 CLINICAL DATA:  77 year old with shortness of breath. EXAM: PORTABLE CHEST 1 VIEW COMPARISON:  Chest CT 02/28/2018 and chest radiograph 11/08/2017 FINDINGS: Bilateral breast implants. No focal airspace disease or pulmonary edema. Right shoulder replacement. Atherosclerotic calcifications at the aortic arch. Prior CABG procedure. Sclerosis and degenerative changes at the left shoulder. Surgical hardware in the lumbar spine. Heart size is within normal limits. IMPRESSION: No acute chest findings. Electronically Signed   By: Markus Daft M.D.   On: 03/13/2018 12:52   Dg Abd 2 Views  Result Date: 03/12/2018 CLINICAL DATA:  Lower abdominal pain beginning this morning post vascular surgery EXAM: ABDOMEN - 2 VIEW COMPARISON:  03/08/2018 FINDINGS: Normal bowel gas pattern. No bowel dilatation or bowel wall thickening. Gas present in rectum. Bones demineralized with evidence of prior lumbar fusion and prior RIGHT hip replacement. Scattered degenerative changes of lower thoracic spine. Surgical clips RIGHT upper quadrant question cholecystectomy. Degenerative changes LEFT hip joint. Question vascular calcification versus 8 mm diameter calculus at upper pole LEFT kidney. IMPRESSION: No acute abnormalities. Electronically Signed   By: Lavonia Dana M.D.   On: 03/12/2018 19:28   Dg Abd Portable 1v  Result Date: 03/16/2018 CLINICAL DATA:  Abdominal pain EXAM: PORTABLE ABDOMEN - 1 VIEW COMPARISON:  None. FINDINGS: There is no bowel dilatation or air-fluid level to suggest bowel obstruction. No free air. There is a total hip replacement on the right as well as postoperative change in the lumbar region. There is a filter in the inferior vena cava. There are calcified breast implants bilaterally. IMPRESSION: No demonstrable bowel obstruction or free air. Electronically Signed   By: Lowella Grip III M.D.   On: 03/16/2018 11:37   Dg Abd Portable  1v  Result Date: 03/08/2018 CLINICAL DATA:  Nausea and vomiting EXAM: PORTABLE ABDOMEN - 1 VIEW COMPARISON:  CT 02/28/2018 FINDINGS: Surgical hardware in the upper lumbar spine. Surgical clips in the right upper quadrant. Nonobstructed bowel-gas pattern. Radiopaque material in the bladder. Probable diverticulum on the right side of the bladder. Status post right hip replacement with fixating screw paralleling the cortex of the right inter pelvis. IMPRESSION: 1. Nonobstructed gas pattern 2. Contrast material within the bladder Electronically Signed   By: Donavan Foil M.D.   On: 03/08/2018  22:30   Ct Angio Abd/pel W/ And/or W/o  Result Date: 03/09/2018 CLINICAL DATA:  Upper abdominal pain and nausea after mesenteric stent placement today. EXAM: CTA ABDOMEN AND PELVIS wITHOUT AND WITH CONTRAST TECHNIQUE: Multidetector CT imaging of the abdomen and pelvis was performed using the standard protocol during bolus administration of intravenous contrast. Multiplanar reconstructed images and MIPs were obtained and reviewed to evaluate the vascular anatomy. CONTRAST:  100 cc Isovue 370 IV COMPARISON:  Abdomen/pelvis CT 02/28/2018 FINDINGS: VASCULAR Aorta: Moderate atherosclerosis. Normal caliber aorta without aneurysm, dissection, vasculitis or significant stenosis. Celiac: Plaque at the origin causes approximately 50% stenosis. No dissection or vasculitis. Distal branches are patent. SMA: Patent proximal SMA stent with resolved origin stenosis. Calcified distally but patent. No evidence of dissection. No evidence of contrast extravasation. Renals: Calcified plaque at the origins. No dissection or vasculitis. IMA: Patent without evidence of aneurysm, dissection, vasculitis or significant stenosis. Inflow: Patent without evidence of aneurysm, dissection, vasculitis or significant stenosis. Proximal Outflow: Bilateral common femoral and visualized portions of the superficial and profunda femoral arteries are patent without  evidence of aneurysm, dissection, vasculitis or significant stenosis. No evidence of pseudoaneurysm formation or active extravasation at the puncture site. Veins: Not well assessed on arterial phase imaging. There is presumed mixing of opacified and non-opacified blood in the mesenteric venous confluence. Review of the MIP images confirms the above findings. NON-VASCULAR Lower chest: Linear atelectasis in both lower lobes. Trace right pleural effusion. Coronary artery calcifications. Peripherally calcified left breast implant partially included. Hepatobiliary: Slight capsular nodularity raises concern for cirrhosis. No discrete focal lesion. Postcholecystectomy with grossly unchanged biliary prominence allowing for arterial phase technique. Pancreas: Parenchymal atrophy. No ductal dilatation or inflammation. Spleen: Normal in size without focal abnormality. Adrenals/Urinary Tract: Mild left adrenal thickening. Normal right adrenal gland. Excreted IV contrast in both kidneys and urinary bladder from angiogram earlier this day. No hydronephrosis or perinephric edema. Bilateral renal cysts. Stomach/Bowel: Small hiatal hernia. Stomach is nondistended. Presence of intra-abdominal ascites limits bowel evaluation. There is colonic wall thickening involving the ascending colon, hepatic flexure, unlikely proximal transverse colon. No definite small bowel inflammation. Colonic diverticulosis throughout the colon. Lymphatic: No definite adenopathy. No evidence of retroperitoneal hemorrhage. Reproductive: Uterus obscured by right hip arthroplasty. Other: Development of moderate volume abdominopelvic ascites. Fluid may be minimally complex, however does not represent values concerning for hemorrhage. There is mild mesenteric edema. No free air. Small supraumbilical hernia contains fat and small amount of free fluid. Musculoskeletal: Postsurgical change in the lumbar spine, unchanged from prior. Right hip arthroplasty.  IMPRESSION: VASCULAR 1. Placement of superior mesenteric artery stent which is widely patent. No evidence of dissection or postprocedural pseudoaneurysm/hematoma. 2. Diffuse aortic and branch atherosclerosis. NON-VASCULAR 1. Interval development of moderate abdominopelvic ascites from prior exam. Fluid is minimally complex but does not represent hemorrhage. 2. Colonic wall thickening involving the cecum, ascending, and proximal transverse colon. This is new since prior CT. Exact etiology is uncertain. Infectious or inflammatory etiologies are considered, colonic wall edema related to reperfusion could have a similar appearance. No increased density to suggest bowel wall hemorrhage. 3. Colonic diverticulosis without specific findings to suggest diverticulitis. 4. Question of nodular hepatic contours raises concern for cirrhosis. Recommend correlation with cirrhosis risk factors. Electronically Signed   By: Jeb Levering M.D.   On: 03/09/2018 03:18   Ir Paracentesis  Result Date: 03/10/2018 INDICATION: Recent superior mesenteric artery stent placement. New ascites. Request for diagnostic paracentesis. EXAM: ULTRASOUND GUIDED RIGHT LATERAL ABDOMEN PARACENTESIS MEDICATIONS:  2% lidocaine = 10 mL COMPLICATIONS: None immediate. PROCEDURE: Informed written consent was obtained from the patient after a discussion of the risks, benefits and alternatives to treatment. A timeout was performed prior to the initiation of the procedure. Initial ultrasound scanning demonstrates a small amount of ascites within the right lateral abdomen. The right lateral abdomen was prepped and draped in the usual sterile fashion. 1% lidocaine with epinephrine was used for local anesthesia. Following this, a 19 gauge, 7-cm, Yueh catheter was introduced. An ultrasound image was saved for documentation purposes. The paracentesis was performed. The catheter was removed and a dressing was applied. The patient tolerated the procedure well without  immediate post procedural complication. FINDINGS: A total of approximately 100 ml of clear red fluid was removed. Samples were sent to the laboratory as requested by the clinical team. IMPRESSION: Successful ultrasound-guided paracentesis yielding 171mL of peritoneal fluid. Read by: Gareth Eagle, PA-C Electronically Signed   By: Lucrezia Europe M.D.   On: 03/10/2018 11:13    Assessment/Plan  #1 hematuria with increased weakness and shortness of breath---now is complaining of some abdominal discomfort   -patient just does not appear to be doing well-she is complaining of increased weakness- and not feeling well.    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   [id number]   Addendum we do have updated lab work that was apparently drawn yesterday on April 10- it shows hemoglobin at 9.1 white count of 7.9 and platelets 251-sodium is 133 potassium 3.7 BUN of 13.1 and creatinine 0.62  -TSH is elevated at 8.08.  Magnesium is borderline low at 1.6

## 2018-04-01 NOTE — ED Provider Notes (Signed)
[facility] EMERGENCY DEPARTMENT Provider Note   CSN: [id number] Arrival date & time: 04/01/18  1515     History   Chief Complaint Chief Complaint  Patient presents with  . Shortness of Breath    HPI [name] is a 77 y.o. female.  The history is provided by the patient and medical records.  Chest Pain   This is a recurrent problem. The current episode started 6 to 12 hours ago. The problem occurs constantly. The problem has not changed since onset.The pain is associated with exertion and breathing. The pain is present in the substernal region. The pain is moderate. The quality of the pain is described as exertional, heavy, pressure-like and sharp. The symptoms are aggravated by deep breathing and exertion. Associated symptoms include abdominal pain, back pain, lower extremity edema, malaise/fatigue and shortness of breath. Pertinent negatives include no cough, no diaphoresis, no exertional chest pressure, no headaches, no nausea, no palpitations, no sputum production and no vomiting. She has tried nothing for the symptoms. The treatment provided no relief.  Her past medical history is significant for COPD, CHF and PE.  Flank Pain  Associated symptoms include chest pain, abdominal pain and shortness of breath. Pertinent negatives include no headaches.  Dysuria   Associated symptoms include frequency and flank pain. Pertinent negatives include no chills, no nausea and no vomiting.    Past Medical History:  Diagnosis Date  . Anemia    bld. transfusion post lumbar surgery- 2012  . Anxiety   . Arthralgia    NOS  . Blood transfusion 2011   WITH BACK SURGERY  . CAD (coronary artery disease)    s/p CABG 2004; s/p DES to LM in 2010;  [facility] 10/29/11: EF 50-55%, mild elevated filling pressures, no pulmonary HTN, LM 90% ISR, LAD and CFX occluded, S-RI occluded (old), S-OM3 ok and L-LAD ok, native nondominant RCA 95% -  med rx recommended ; Lexiscan Myoview 7/13 at  [facility]: demonstrated "normal LV function, anterior attenuation and localized ischemia, inferior, basilar, mid section"  . Carotid artery occlusion    moderate  . Chronic diastolic heart failure (HCC)    Echo 9/10: EF 35-00%, grade 1 diastolic dysfunction  . COPD (chronic obstructive pulmonary disease) ([place])    Emphysema dxed by Dr. Woody Seller in [place] based on PFTs per pt in 2006; placed on albuterol  . Depression    TAKES CELEXA  AND  (OFF- WELBUTRIN)  . DVT of lower extremity (deep venous thrombosis) (HCC)    recurrent. bilateral (2 episodes)  . Dyspnea   . Dysrhythmia    afib with cabg  . Exertional angina (HCC)    Treated with Isosorbide, Ranexa, amlodipine; intolerant to metoprolol  . GERD (gastroesophageal reflux disease)   . HLD (hyperlipidemia)   . Hypertension   . Hyperthyroidism   . Obesity (BMI 30-39.9) 2009   BMI 33  . Osteoarthritis   . Pneumonia   . PVD (peripheral vascular disease) ([place])   . Sleep apnea 2012   USED CPAP THEN  STARTED USING SPIRIVA , Nov. 2013- last evaluation , changed from mask to aparatus that is just for her nose..     Patient Active Problem List   Diagnosis Date Noted  . Chronic diastolic heart failure ([place]) 03/29/2018  . Acute deep vein thrombosis (DVT) of left femoral vein ([place]) 03/29/2018  . GERD without esophagitis 03/29/2018  . Chronic gout 03/29/2018  . Chronic bilateral low back pain without sciatica 03/29/2018  .  Hypomagnesemia 03/29/2018  . Pain   . ARF (acute renal failure) ([place]) 03/15/2018  . Hemorrhagic shock ([place])   . Acute respiratory distress 03/13/2018  . Colitis 03/10/2018  . Abnormal CT of the abdomen 03/10/2018  . AKI (acute kidney injury) ([place])   . Ascites   . Pulmonary embolism ([place]) 03/01/2018  . 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[place]) 03/01/2018  . Acute on chronic respiratory failure with hypoxia ([place]) 03/01/2018  . History of laparoscopic cholecystectomy 12/07/2017  . Heme positive stool   . Angiodysplasia of  intestine   . Hematochezia   . Malnutrition of moderate degree 02/15/2017  . GI bleed 02/13/2017  . Blood loss anemia 02/13/2017  . Impingement syndrome of left shoulder 02/10/2013  . Chest pain 11/17/2011  . PAD (peripheral artery disease) ([place]) 11/17/2011  . Dyslipidemia 10/17/2011  . CAD (coronary artery disease)   . DEEP VENOUS THROMBOPHLEBITIS 12/27/2008  . Hypothyroidism 05/03/2008  . Essential hypertension 05/03/2008  . EMPHYSEMA 05/03/2008  . OBSTRUCTIVE SLEEP APNEA 05/03/2008  . COPD (chronic obstructive pulmonary disease) ([place]) 04/12/2008    Past Surgical History:  Procedure Laterality Date  . ANKLE SURGERY  2004  . [place]   lower; another scheduled, opt. reports 4 back- lumbar, 3 cerv. fusions  for later 2009  . BREAST ENHANCEMENT SURGERY    . CARDIAC CATHETERIZATION  11/2009   Patent LIMA to LAD and patent SVG to OM1. Occluded SVG to ramus and diagonal. Left main: 90% ostial stenosis, LCX 60-70% proximal stenosis  . COLONOSCOPY WITH PROPOFOL N/A 02/17/2017   Procedure: COLONOSCOPY WITH PROPOFOL;  Surgeon: Jerene Bears, MD;  Location: [facility] ENDOSCOPY;  Service: Endoscopy;  Laterality: N/A;  . CORONARY ANGIOPLASTY WITH STENT PLACEMENT  11/2009   Drug eluting stent to left main artery: 4.0 X 12 mm Ion   . CORONARY ARTERY BYPASS GRAFT  2004  . ESOPHAGOGASTRODUODENOSCOPY N/A 02/14/2017   Procedure: ESOPHAGOGASTRODUODENOSCOPY (EGD);  Surgeon: Doran Stabler, MD;  Location: [facility] ENDOSCOPY;  Service: Endoscopy;  Laterality: N/A;  . EYE SURGERY  2006   BILATERAL CAT EXT.   Marland Kitchen GIVENS CAPSULE STUDY N/A 02/15/2017   Procedure: GIVENS CAPSULE STUDY;  Surgeon: Doran Stabler, MD;  Location: [facility];  Service: Endoscopy;  Laterality: N/A;  . GROIN MASS OPEN BIOPSY  2004  . IR IVC FILTER PLMT / S&I /IMG GUID/MOD SED  03/14/2018  . IR PARACENTESIS  03/10/2018  . JOINT REPLACEMENT  2012    BILATERAL KNEES  . JOINT REPLACEMENT  2010    RIGHT HIP REPLACEMENT  . NECK  SURGERY  12/08   Due for repeat neck surgery  . PERIPHERAL VASCULAR INTERVENTION Left 03/05/2018   Procedure: PERIPHERAL VASCULAR INTERVENTION;  Surgeon: Elam Dutch, MD;  Location: [facility] CV LAB;  Service: Cardiovascular;  Laterality: Left;  Attempted unsuccess\ful Per Dr. Eden Lathe  . PERIPHERAL VASCULAR INTERVENTION  03/08/2018   Procedure: PERIPHERAL VASCULAR INTERVENTION;  Surgeon: Waynetta Sandy, MD;  Location: [facility] CV LAB;  Service: Cardiovascular;;  SMA Stent   . REVERSE SHOULDER ARTHROPLASTY  02/26/2012   Procedure: REVERSE SHOULDER ARTHROPLASTY;  Surgeon: Marin Shutter, MD;  Location: [facility];  Service: Orthopedics;  Laterality: Right;  RIGHT SHOULDER REVERSED ARTHROPLASTY  . SHOULDER ARTHROSCOPY WITH SUBACROMIAL DECOMPRESSION Left 02/10/2013   Procedure: SHOULDER ARTHROSCOPY WITH SUBACROMIAL DECOMPRESSION DISTAL CLAVICLE RESECTION;  Surgeon: Marin Shutter, MD;  Location: [facility];  Service: Orthopedics;  Laterality: Left;  DISTAL CLAVICLE RESECTION  . TOTAL  HIP ARTHROPLASTY  2007   Right  . VISCERAL ANGIOGRAPHY N/A 03/05/2018   Procedure: VISCERAL ANGIOGRAPHY;  Surgeon: Elam Dutch, MD;  Location: [facility] CV LAB;  Service: Cardiovascular;  Laterality: N/A;  . VISCERAL ANGIOGRAPHY N/A 03/08/2018   Procedure: VISCERAL ANGIOGRAPHY;  Surgeon: Waynetta Sandy, MD;  Location: [facility] CV LAB;  Service: Cardiovascular;  Laterality: N/A;     OB History   None      Home Medications    Prior to Admission medications   Medication Sig Start Date End Date Taking? Authorizing Provider  allopurinol (ZYLOPRIM) 100 MG tablet Take 100 mg by mouth daily. 01/30/17   [provider]  aspirin EC 81 MG tablet Take 81 mg by mouth daily.    [provider]  citalopram (CELEXA) 20 MG tablet Take 30 mg by mouth daily.     [provider]  CVS D3 1000 units capsule Take 1,000 Units by mouth daily. 01/14/18   [provider]    feeding supplement, ENSURE ENLIVE, (ENSURE ENLIVE) LIQD Take 237 mLs by mouth 3 (three) times daily between meals. 03/26/18   Mikhail, Velta Addison, DO  ferrous sulfate 325 (65 FE) MG tablet Take 325 mg by mouth daily with breakfast.    [provider]  furosemide (LASIX) 40 MG tablet Take 1 tablet (40 mg total) by mouth 2 (two) times daily. 03/26/18   Mikhail, Maryann, DO  INCRUSE ELLIPTA 62.5 MCG/INH AEPB Inhale 1 puff into the lungs 2 (two) times daily.  11/30/16   [provider]  Ipratropium-Albuterol (COMBIVENT) 20-100 MCG/ACT AERS respimat Inhale 1 puff into the lungs every 6 (six) hours as needed for wheezing.    [provider]  magnesium oxide (MAG-OX) 400 (241.3 Mg) MG tablet Take 1 tablet (400 mg total) by mouth 2 (two) times daily. 03/26/18   Mikhail, Velta Addison, DO  nitroGLYCERIN (NITROSTAT) 0.4 MG SL tablet Place 1 tablet (0.4 mg total) under the tongue every 5 (five) minutes as needed for chest pain. For chest pain 04/24/16   Fay Records, MD  NON FORMULARY Give 120 ml of Med Pass by mouth three times a day    [provider]  omeprazole (PRILOSEC) 40 MG capsule Take 40 mg by mouth 2 (two) times daily.     [provider]  Oxycodone HCl 10 MG TABS Take 1 tablet (10 mg total) by mouth every 6 (six) hours as needed. 03/26/18   Mikhail, Velta Addison, DO  pilocarpine (PILOCAR) 1 % ophthalmic solution Place 1 drop into both eyes 2 (two) times daily. 11/30/16   [provider]  potassium chloride SA (K-DUR,KLOR-CON) 20 MEQ tablet Take 20 mEq by mouth daily.    [provider]  pramipexole (MIRAPEX) 0.25 MG tablet Take 0.25 mg by mouth at bedtime. 01/14/18   [provider]  roflumilast (DALIRESP) 500 MCG TABS tablet Take 500 mcg by mouth daily.     [provider]  traZODone (DESYREL) 50 MG tablet Take 150 mg by mouth at bedtime.     [provider]    Family History Family History  Problem Relation Age of Onset  .  Ovarian cancer Mother   . Cancer Mother   . Lung cancer Father   . COPD Father   . Heart disease Father   . Cancer Unknown   . Heart disease Unknown   . Colitis Unknown   . Heart disease Sister   . Cancer Sister  Social History Social History   Tobacco Use  . Smoking status: Former Smoker    Years: 45.00    Last attempt to quit: 12/22/2002    Years since quitting: 15.2  . Smokeless tobacco: Never Used  Substance Use Topics  . Alcohol use: No    Alcohol/week: 0.0 oz  . Drug use: No     Allergies   Zolpidem tartrate; Pitavastatin; Ropinirole; and Penicillins   Review of Systems Review of Systems  Constitutional: Positive for fatigue and malaise/fatigue. Negative for chills and diaphoresis.  HENT: Negative for congestion.   Eyes: Negative for visual disturbance.  Respiratory: Positive for chest tightness and shortness of breath. Negative for cough, sputum production, wheezing and stridor.   Cardiovascular: Positive for chest pain and leg swelling. Negative for palpitations.  Gastrointestinal: Positive for abdominal pain. Negative for constipation, diarrhea, nausea and vomiting.  Genitourinary: Positive for dysuria, flank pain and frequency.  Musculoskeletal: Positive for back pain.  Neurological: Negative for light-headedness and headaches.  All other systems reviewed and are negative.    Physical Exam Updated Vital Signs BP (!) 110/57   Pulse 77   Temp 99.5 F (37.5 C) (Oral)   Resp 16   Ht 5\' 8"  (1.727 m)   Wt 97.5 kg (215 lb)   SpO2 96%   BMI 32.69 kg/m   Physical Exam  Constitutional: She appears well-developed and well-nourished.  Non-toxic appearance. She does not appear ill. No distress.  Eyes: Pupils are equal, round, and reactive to light.  Neck: Normal range of motion.  Cardiovascular: Tachycardia present.  Pulmonary/Chest: Effort normal. Tachypnea noted. She has rhonchi. She has rales.  Abdominal: Soft. Bowel sounds are normal. There is  generalized tenderness.    Musculoskeletal:       Back:  Neurological: She is alert. She is not disoriented.  Skin: Skin is warm. Capillary refill takes less than 2 seconds. No erythema. No pallor.  Nursing note and vitals reviewed.    ED Treatments / Results  Labs (all labs ordered are listed, but only abnormal results are displayed) Labs Reviewed  CBC WITH DIFFERENTIAL/PLATELET - Abnormal; Notable for the following components:      Result Value   RBC 2.89 (*)    Hemoglobin 8.3 (*)    HCT 27.2 (*)    RDW 17.0 (*)    All other components within normal limits  COMPREHENSIVE METABOLIC PANEL - Abnormal; Notable for the following components:   Chloride 95 (*)    CO2 36 (*)    Calcium 8.0 (*)    Total Protein 5.0 (*)    Albumin 2.2 (*)    All other components within normal limits  BRAIN NATRIURETIC PEPTIDE - Abnormal; Notable for the following components:   B Natriuretic Peptide 194.0 (*)    All other components within normal limits  URINALYSIS, ROUTINE W REFLEX MICROSCOPIC - Abnormal; Notable for the following components:   Color, Urine RED (*)    APPearance HAZY (*)    Specific Gravity, Urine 1.039 (*)    Glucose, UA 50 (*)    Hgb urine dipstick LARGE (*)    Protein, ur 100 (*)    Nitrite POSITIVE (*)    All other components within normal limits  URINALYSIS, MICROSCOPIC (REFLEX) - Abnormal; Notable for the following components:   Squamous Epithelial / LPF 0-5 (*)    All other components within normal limits  URINE CULTURE  CULTURE, BLOOD (ROUTINE X 2)  CULTURE, BLOOD (ROUTINE X 2)  LIPASE,  BLOOD  PROTIME-INR  MAGNESIUM  I-STAT TROPONIN, ED  I-STAT TROPONIN, ED    EKG EKG Interpretation  Date/Time:  Thursday April 01 2018 15:24:47 EDT Ventricular Rate:  90 PR Interval:    QRS Duration: 107 QT Interval:  386 QTC Calculation: 473 R Axis:   -53 Text Interpretation:  Sinus rhythm Atrial premature complexes LAD, consider left anterior fascicular block Left  ventricular hypertrophy When compared to prior, no signifianty changes seen.  No STEMI Confirmed by Antony Blackbird [phone]) on 04/01/2018 6:06:22 PM   Radiology Dg Chest 2 View  Result Date: 04/01/2018 CLINICAL DATA:  Chest pain and shortness of breath EXAM: CHEST - 2 VIEW COMPARISON:  03/18/2018, 03/15/2018, 03/13/2018 FINDINGS: Status post right shoulder replacement. Post sternotomy changes. Calcified breast implants. Small bilateral pleural effusions. Cardiomegaly with vascular congestion and mild diffuse interstitial opacity consistent with edema. Apical scarring. IMPRESSION: 1. Cardiomegaly with vascular congestion and mild interstitial pulmonary edema and small pleural effusions. Electronically Signed   By: Donavan Foil M.D.   On: 04/01/2018 17:08   Ct Angio Chest Pe W And/or Wo Contrast  Result Date: 04/01/2018 CLINICAL DATA:  Increased shortness of breath. Recently admitted for pulmonary embolism. Patient also complains of hematuria. EXAM: CT ANGIOGRAPHY CHEST CT ABDOMEN AND PELVIS WITH CONTRAST TECHNIQUE: Multidetector CT imaging of the chest was performed using the standard protocol during bolus administration of intravenous contrast. Multiplanar CT image reconstructions and MIPs were obtained to evaluate the vascular anatomy. Multidetector CT imaging of the abdomen and pelvis was performed using the standard protocol during bolus administration of intravenous contrast. CONTRAST:  148mL ISOVUE-370 IOPAMIDOL (ISOVUE-370) INJECTION 76% COMPARISON:  CT abdomen pelvis dated March 14, 2018. CT chest dated February 28, 2018. FINDINGS: CTA CHEST FINDINGS Cardiovascular: Satisfactory opacification of the pulmonary arteries to the segmental level. No evidence of pulmonary embolism. Previously seen pulmonary emboli in the left lower lobe have resolved. Mild left atrial enlargement. No pericardial effusion. Normal caliber thoracic aorta. Coronary, aortic arch, and branch vessel atherosclerotic vascular disease.  Mediastinum/Nodes: No enlarged mediastinal, hilar, or axillary lymph nodes. Thyroid gland, trachea, and esophagus demonstrate no significant findings. Lungs/Pleura: Unchanged moderate right and trace left pleural effusions. Right greater than left lower lobe subsegmental atelectasis. No consolidation or pneumothorax. Unchanged biapical pleuroparenchymal scarring. No suspicious pulmonary nodule. Musculoskeletal: Calcified bilateral breast implants. No acute or significant osseous findings. Review of the MIP images confirms the above findings. CT ABDOMEN and PELVIS FINDINGS Hepatobiliary: No focal liver abnormality is seen. Status post cholecystectomy. No biliary dilatation. Pancreas: Severely atrophic. No surrounding inflammatory changes or ductal dilatation. Spleen: Normal in size without focal abnormality. Adrenals/Urinary Tract: The adrenal glands are unremarkable. Small bilateral renal cysts are unchanged. No renal or ureteral calculi. No hydronephrosis. The bladder is unremarkable. Stomach/Bowel: Small hiatal hernia. The stomach is otherwise within normal limits. Normal appendix. No bowel wall thickening, distention, or surrounding inflammatory changes. Colonic diverticulosis. Vascular/Lymphatic: Interval placement of an IVC filter. Unchanged diffuse aortic and branch vessel atherosclerotic vascular calcifications. No enlarged abdominal or pelvic lymph nodes. Reproductive: No adnexal mass. Other: No significant interval change in size of the large anterior abdominal wall hematoma also involving the space of Retzius, now measuring 11.0 x 16.7 x 12.6 cm, previously 11.9 x 16.9 x 12.4 cm. No free fluid or pneumoperitoneum. Musculoskeletal: No acute or significant osseous findings. Prior right total hip arthroplasty and lumbar spine posterior decompression and fusion. Review of the MIP images confirms the above findings. IMPRESSION: 1. No evidence of pulmonary embolism. Interval  resolution of previously seen left  lower lobe pulmonary emboli. 2. Unchanged moderate right and trace left pleural effusions with adjacent bibasilar atelectasis. 3. Relatively unchanged large anterior abdominal wall hematoma involving the space of Retzius, measuring 11.0 x 16.7 x 12.6 cm. 4.  Aortic atherosclerosis (ICD10-I70.0). Electronically Signed   By: Titus Dubin M.D.   On: 04/01/2018 19:07   Ct Abdomen Pelvis W Contrast  Result Date: 04/01/2018 CLINICAL DATA:  Increased shortness of breath. Recently admitted for pulmonary embolism. Patient also complains of hematuria. EXAM: CT ANGIOGRAPHY CHEST CT ABDOMEN AND PELVIS WITH CONTRAST TECHNIQUE: Multidetector CT imaging of the chest was performed using the standard protocol during bolus administration of intravenous contrast. Multiplanar CT image reconstructions and MIPs were obtained to evaluate the vascular anatomy. Multidetector CT imaging of the abdomen and pelvis was performed using the standard protocol during bolus administration of intravenous contrast. CONTRAST:  183mL ISOVUE-370 IOPAMIDOL (ISOVUE-370) INJECTION 76% COMPARISON:  CT abdomen pelvis dated March 14, 2018. CT chest dated February 28, 2018. FINDINGS: CTA CHEST FINDINGS Cardiovascular: Satisfactory opacification of the pulmonary arteries to the segmental level. No evidence of pulmonary embolism. Previously seen pulmonary emboli in the left lower lobe have resolved. Mild left atrial enlargement. No pericardial effusion. Normal caliber thoracic aorta. Coronary, aortic arch, and branch vessel atherosclerotic vascular disease. Mediastinum/Nodes: No enlarged mediastinal, hilar, or axillary lymph nodes. Thyroid gland, trachea, and esophagus demonstrate no significant findings. Lungs/Pleura: Unchanged moderate right and trace left pleural effusions. Right greater than left lower lobe subsegmental atelectasis. No consolidation or pneumothorax. Unchanged biapical pleuroparenchymal scarring. No suspicious pulmonary nodule.  Musculoskeletal: Calcified bilateral breast implants. No acute or significant osseous findings. Review of the MIP images confirms the above findings. CT ABDOMEN and PELVIS FINDINGS Hepatobiliary: No focal liver abnormality is seen. Status post cholecystectomy. No biliary dilatation. Pancreas: Severely atrophic. No surrounding inflammatory changes or ductal dilatation. Spleen: Normal in size without focal abnormality. Adrenals/Urinary Tract: The adrenal glands are unremarkable. Small bilateral renal cysts are unchanged. No renal or ureteral calculi. No hydronephrosis. The bladder is unremarkable. Stomach/Bowel: Small hiatal hernia. The stomach is otherwise within normal limits. Normal appendix. No bowel wall thickening, distention, or surrounding inflammatory changes. Colonic diverticulosis. Vascular/Lymphatic: Interval placement of an IVC filter. Unchanged diffuse aortic and branch vessel atherosclerotic vascular calcifications. No enlarged abdominal or pelvic lymph nodes. Reproductive: No adnexal mass. Other: No significant interval change in size of the large anterior abdominal wall hematoma also involving the space of Retzius, now measuring 11.0 x 16.7 x 12.6 cm, previously 11.9 x 16.9 x 12.4 cm. No free fluid or pneumoperitoneum. Musculoskeletal: No acute or significant osseous findings. Prior right total hip arthroplasty and lumbar spine posterior decompression and fusion. Review of the MIP images confirms the above findings. IMPRESSION: 1. No evidence of pulmonary embolism. Interval resolution of previously seen left lower lobe pulmonary emboli. 2. Unchanged moderate right and trace left pleural effusions with adjacent bibasilar atelectasis. 3. Relatively unchanged large anterior abdominal wall hematoma involving the space of Retzius, measuring 11.0 x 16.7 x 12.6 cm. 4.  Aortic atherosclerosis (ICD10-I70.0). Electronically Signed   By: Titus Dubin M.D.   On: 04/01/2018 19:07    Procedures Procedures  (including critical care time)  Medications Ordered in ED Medications  iopamidol (ISOVUE-370) 76 % injection (has no administration in time range)  aztreonam (AZACTAM) 1 g in sodium chloride 0.9 % 100 mL IVPB (has no administration in time range)  oxyCODONE (Oxy IR/ROXICODONE) immediate release tablet 10 mg (10 mg Oral  Given 04/01/18 1931)  iopamidol (ISOVUE-370) 76 % injection 100 mL (100 mLs Intravenous Contrast Given 04/01/18 1816)  acetaminophen (TYLENOL) tablet 650 mg (650 mg Oral Given 04/01/18 2247)     Initial Impression / Assessment and Plan / ED Course  I have reviewed the triage vital signs and the nursing notes.  Pertinent labs & imaging results that were available during my care of the patient were reviewed by me and considered in my medical decision making (see chart for details).     [name] is a 77 y.o. female with a comp gated past medical history including COPD with 2 L home oxygen requirement at baseline, hypertension, CAD, hypothyroidism, peripheral artery disease status post recent SMA stenting, DVT with recent pulmonary embolism with admission and IVC filter placement after patient developed GI bleed and abdominal wall hematoma, and congestive heart failure who presents with chest pain, shortness of breath, abdominal pain, hematuria, dysuria, and left flank pain.  Patient reports that she has been doing fairly well since her discharge recently from her complicated admission for the acute DVT, pulmonary embolism, GI and abdominal wall bleed and the IVC placement and SMA stenting.  She says that yesterday she began having chest pain and shortness of breath.  She reports it is exertional.  It is in her central left chest and is a pressure.  She reports it feels like her blood clot pain.  She describes it as up to an 8 out of 10 severity but is currently a 0 out of 10.  She does report continued shortness of breath and was having low oxygen saturations on her 2 L baseline.   Patient is now on 4 L with sats in the 90s.  Patient reports the shortness breath is also exertional.  She reports diaphoresis but no nausea or vomiting.  She reports that she is having diffuse abdominal pain and pain in her left flank where she has development of ecchymosis over the last few days.  She denies any recent trauma.  She does report some dysuria and hematuria.  She is not on anticoagulation due to the bleeding episode she had.  On exam, patient has some diffuse abdominal tenderness as well as left flank tenderness.  Patient has lateral abdominal wall bruising.  Patient's lungs had coarse breath sounds in all lung fields.  Minimal chest tenderness present.  No murmur appreciated.  Legs are very edematous bilaterally and patient reports this is also been worsening over the last few days.  Abdomen is diffusely tender.  EKG was performed showing no evidence of STEMI.  I am concerned patient may have worsened pulmonary embolism, worsened abdominal wall bleeding, symptom medic anemia, worsened congestive heart failure, or COPD exacerbation contributing to symptoms.  I did not hear significant wheezing on my exam however patient had poor air movement with her rhonchi.  Next  Patient will have laboratory testing, urinalysis, troponin and BNP and will also have CTA of the chest to look for worsened clot burden as well as abdomen and pelvis CT to look for worsened abdominal wall hematoma.  Given the increased oxygen requirement and the patient's complicated history patient will need admission.  Patient continues to have some shortness of breath with her increased oxygen requirement.  Patient's imaging was performed and revealed no evidence of pneumonia, no evidence of recurrent pulmonary embolism, and no worsening of her abdominal hematoma.  Patient reports her chest pain has improved during her ED stay.    Laboratory testing showed  evidence of UTI with nitrite positivity.  In the setting of her  urinary symptoms, will treat for UTI.  Patient has a penicillin allergy, will give aztreonam.  Patient may have pyelonephritis causing the flank pain she is experiencing in the setting of UTI.  Troponin was negative and BNP was improved from prior.  Given the discovery of urinary tract infection/pyelonephritis in the setting of the worsened oxygen requirement, patient will be admitted for further management.  11:49 PM Hospitalist team requested that urology be called.  I spoke with Dr. Junious Silk with urology who feels patient is likely appropriate for outpatient follow-up.  He reports that he will look at the CT scan tomorrow and determine further management.  He agreed with treating for UTI with a urine culture and trending her kidney function.  Patient will be admitted to hospital service.   Final Clinical Impressions(s) / ED Diagnoses   Final diagnoses:  Pyelonephritis  Shortness of breath  Chest pain, unspecified type  Hematuria, unspecified type  Flank pain     Clinical Impression: No diagnosis found. Clinical Impression: 1. Pyelonephritis   2. Shortness of breath   3. Chest pain, unspecified type   4. Hematuria, unspecified type   5. Flank pain     Disposition: Admit  This note was prepared with assistance of Dragon voice recognition software. Occasional wrong-word or sound-a-like substitutions may have occurred due to the inherent limitations of voice recognition software.     Tegeler, Gwenyth Allegra, MD 04/01/18 [phone]

## 2018-04-02 ENCOUNTER — Inpatient Hospital Stay (HOSPITAL_COMMUNITY): Payer: Medicare HMO

## 2018-04-02 DIAGNOSIS — R079 Chest pain, unspecified: Secondary | ICD-10-CM

## 2018-04-02 DIAGNOSIS — N390 Urinary tract infection, site not specified: Secondary | ICD-10-CM

## 2018-04-02 DIAGNOSIS — J449 Chronic obstructive pulmonary disease, unspecified: Secondary | ICD-10-CM

## 2018-04-02 DIAGNOSIS — R310 Gross hematuria: Secondary | ICD-10-CM

## 2018-04-02 DIAGNOSIS — I10 Essential (primary) hypertension: Secondary | ICD-10-CM

## 2018-04-02 LAB — BASIC METABOLIC PANEL
Anion gap: 10 (ref 5–15)
BUN: 8 mg/dL (ref 6–20)
CO2: 36 mmol/L — ABNORMAL HIGH (ref 22–32)
Calcium: 8.6 mg/dL — ABNORMAL LOW (ref 8.9–10.3)
Chloride: 95 mmol/L — ABNORMAL LOW (ref 101–111)
Creatinine, Ser: 0.77 mg/dL (ref 0.44–1.00)
GFR calc Af Amer: >60 mL/min (ref >60)
GFR calc non Af Amer: >60 mL/min (ref >60)
Glucose, Bld: 101 mg/dL — ABNORMAL HIGH (ref 65–99)
Potassium: 3.3 mmol/L — ABNORMAL LOW (ref 3.5–5.1)
Sodium: 141 mmol/L (ref 135–145)

## 2018-04-02 LAB — FOLATE: Folate: 29.3 ng/mL (ref >5.9)

## 2018-04-02 LAB — IRON AND TIBC
Iron: 29 ug/dL (ref 28–170)
Saturation Ratios: 17 % (ref 10.4–31.8)
TIBC: 172 ug/dL — ABNORMAL LOW (ref 250–450)
UIBC: 143 ug/dL

## 2018-04-02 LAB — RETICULOCYTES
RBC.: 3.11 MIL/uL — ABNORMAL LOW (ref 3.87–5.11)
Retic Count, Absolute: 130.6 10*3/uL (ref 19.0–186.0)
Retic Ct Pct: 4.2 % — ABNORMAL HIGH (ref 0.4–3.1)

## 2018-04-02 LAB — CBC
HCT: 29.3 % — ABNORMAL LOW (ref 36.0–46.0)
Hemoglobin: 8.9 g/dL — ABNORMAL LOW (ref 12.0–15.0)
MCH: 28.5 pg (ref 26.0–34.0)
MCHC: 30.4 g/dL (ref 30.0–36.0)
MCV: 93.9 fL (ref 78.0–100.0)
Platelets: 313 10*3/uL (ref 150–400)
RBC: 3.12 MIL/uL — ABNORMAL LOW (ref 3.87–5.11)
RDW: 17 % — ABNORMAL HIGH (ref 11.5–15.5)
WBC: 7.2 10*3/uL (ref 4.0–10.5)

## 2018-04-02 LAB — MRSA PCR SCREENING: MRSA by PCR: NEGATIVE

## 2018-04-02 LAB — HEMOGLOBIN AND HEMATOCRIT, BLOOD
HCT: 29.3 % — ABNORMAL LOW (ref 36.0–46.0)
HCT: 28.5 % — ABNORMAL LOW (ref 36.0–46.0)
Hemoglobin: 8.8 g/dL — ABNORMAL LOW (ref 12.0–15.0)
Hemoglobin: 8.6 g/dL — ABNORMAL LOW (ref 12.0–15.0)

## 2018-04-02 LAB — TYPE AND SCREEN
ABO/RH(D): O POS
Antibody Screen: NEGATIVE

## 2018-04-02 LAB — FERRITIN: Ferritin: 322 ng/mL — ABNORMAL HIGH (ref 11–307)

## 2018-04-02 LAB — VITAMIN B12: Vitamin B-12: 402 pg/mL (ref 180–914)

## 2018-04-02 MED ORDER — ALLOPURINOL 100 MG PO TABS
100.0000 mg | ORAL_TABLET | Freq: Every day | ORAL | Status: DC
  Administered 2018-04-02 – 2018-04-06 (×5): 100 mg via ORAL
  Filled 2018-04-02 (×5): qty 1

## 2018-04-02 MED ORDER — IPRATROPIUM-ALBUTEROL 0.5-2.5 (3) MG/3ML IN SOLN
3.0000 mL | Freq: Four times a day (QID) | RESPIRATORY_TRACT | Status: DC | PRN
  Administered 2018-04-02: 3 mL via RESPIRATORY_TRACT
  Filled 2018-04-02: qty 3

## 2018-04-02 MED ORDER — NITROGLYCERIN 0.4 MG SL SUBL: 0.4000 mg | SUBLINGUAL_TABLET | SUBLINGUAL | Status: DC | PRN

## 2018-04-02 MED ORDER — TRAZODONE HCL 150 MG PO TABS
150.0000 mg | ORAL_TABLET | Freq: Every day | ORAL | Status: DC
  Administered 2018-04-02 – 2018-04-05 (×5): 150 mg via ORAL
  Filled 2018-04-02 (×5): qty 1

## 2018-04-02 MED ORDER — ONDANSETRON HCL 4 MG/2ML IJ SOLN: 4.0000 mg | Freq: Four times a day (QID) | INTRAMUSCULAR | Status: DC | PRN

## 2018-04-02 MED ORDER — PANTOPRAZOLE SODIUM 40 MG PO TBEC
40.0000 mg | DELAYED_RELEASE_TABLET | Freq: Every day | ORAL | Status: DC
  Administered 2018-04-02 – 2018-04-06 (×5): 40 mg via ORAL
  Filled 2018-04-02 (×6): qty 1

## 2018-04-02 MED ORDER — PRAMIPEXOLE DIHYDROCHLORIDE 0.25 MG PO TABS
0.2500 mg | ORAL_TABLET | Freq: Every day | ORAL | Status: DC
  Administered 2018-04-02 – 2018-04-05 (×5): 0.25 mg via ORAL
  Filled 2018-04-02 (×5): qty 1

## 2018-04-02 MED ORDER — AZTREONAM 1 G IJ SOLR: 1.0000 g | Freq: Three times a day (TID) | INTRAMUSCULAR | Status: DC

## 2018-04-02 MED ORDER — ACETAMINOPHEN 325 MG PO TABS
650.0000 mg | ORAL_TABLET | Freq: Four times a day (QID) | ORAL | Status: DC | PRN
  Administered 2018-04-02 – 2018-04-04 (×3): 650 mg via ORAL
  Filled 2018-04-02 (×3): qty 2

## 2018-04-02 MED ORDER — FUROSEMIDE 10 MG/ML IJ SOLN
40.0000 mg | Freq: Two times a day (BID) | INTRAMUSCULAR | Status: DC
  Administered 2018-04-02 – 2018-04-04 (×6): 40 mg via INTRAVENOUS
  Filled 2018-04-02 (×6): qty 4

## 2018-04-02 MED ORDER — ROFLUMILAST 500 MCG PO TABS
500.0000 ug | ORAL_TABLET | Freq: Every day | ORAL | Status: DC
  Administered 2018-04-02 – 2018-04-05 (×4): 500 ug via ORAL
  Filled 2018-04-02 (×5): qty 1

## 2018-04-02 MED ORDER — FUROSEMIDE 10 MG/ML IJ SOLN: 20.0000 mg | Freq: Two times a day (BID) | INTRAMUSCULAR | Status: DC

## 2018-04-02 MED ORDER — PILOCARPINE HCL 1 % OP SOLN
1.0000 [drp] | Freq: Two times a day (BID) | OPHTHALMIC | Status: DC
  Administered 2018-04-02 – 2018-04-06 (×10): 1 [drp] via OPHTHALMIC
  Filled 2018-04-02: qty 15

## 2018-04-02 MED ORDER — SODIUM CHLORIDE 0.9 % IV SOLN
1.0000 g | Freq: Three times a day (TID) | INTRAVENOUS | Status: DC
  Administered 2018-04-02 – 2018-04-04 (×6): 1 g via INTRAVENOUS
  Filled 2018-04-02 (×8): qty 1

## 2018-04-02 MED ORDER — UMECLIDINIUM BROMIDE 62.5 MCG/INH IN AEPB
1.0000 | INHALATION_SPRAY | Freq: Two times a day (BID) | RESPIRATORY_TRACT | Status: DC
  Administered 2018-04-02 – 2018-04-06 (×8): 1 via RESPIRATORY_TRACT
  Filled 2018-04-02: qty 7

## 2018-04-02 MED ORDER — ORAL CARE MOUTH RINSE
15.0000 mL | Freq: Two times a day (BID) | OROMUCOSAL | Status: DC
  Administered 2018-04-02 – 2018-04-06 (×7): 15 mL via OROMUCOSAL

## 2018-04-02 MED ORDER — POLYETHYLENE GLYCOL 3350 17 G PO PACK
17.0000 g | PACK | Freq: Once | ORAL | Status: AC
  Administered 2018-04-02: 17 g via ORAL
  Filled 2018-04-02: qty 1

## 2018-04-02 MED ORDER — ONDANSETRON HCL 4 MG PO TABS: 4.0000 mg | ORAL_TABLET | Freq: Four times a day (QID) | ORAL | Status: DC | PRN

## 2018-04-02 MED ORDER — CITALOPRAM HYDROBROMIDE 10 MG PO TABS
30.0000 mg | ORAL_TABLET | Freq: Every day | ORAL | Status: DC
  Administered 2018-04-02 – 2018-04-06 (×5): 30 mg via ORAL
  Filled 2018-04-02 (×5): qty 1

## 2018-04-02 MED ORDER — POLYETHYLENE GLYCOL 3350 17 G PO PACK
17.0000 g | PACK | Freq: Every day | ORAL | Status: DC | PRN
  Administered 2018-04-02: 17 g via ORAL
  Filled 2018-04-02: qty 1

## 2018-04-02 MED ORDER — FERROUS SULFATE 325 (65 FE) MG PO TABS
325.0000 mg | ORAL_TABLET | Freq: Every day | ORAL | Status: DC
  Administered 2018-04-02: 325 mg via ORAL
  Filled 2018-04-02: qty 1

## 2018-04-02 MED ORDER — OXYCODONE HCL 5 MG PO TABS
10.0000 mg | ORAL_TABLET | Freq: Four times a day (QID) | ORAL | Status: DC | PRN
  Administered 2018-04-02 – 2018-04-04 (×8): 10 mg via ORAL
  Filled 2018-04-02 (×8): qty 2

## 2018-04-02 NOTE — Clinical Social Work Note (Signed)
Patient was discharged to [facility] for rehab on 4/5. Please place PT and OT orders for evaluations when appropriate.  Dayton Scrape, [place]

## 2018-04-02 NOTE — Progress Notes (Signed)
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, recurrent DVT on chronic Coumadin, HTN, COPD, carotid stenosis, upper GI bleed gastric erosions-colonoscopy pan diverticulitis, asymptomatic cholelithiasis, systolic CHF EF 40% on chronic home oxygen Recent prolonged hospitalization 3/10-03/26/18 with right-sided ischemic colitis acute hypoxic failure with IVC filter placed 03/14/2018-at that admission needed pressor factors briefly secondary to shock-was also found to have a new large anterior abdominal wall hematoma 16.9 X 11.9 X 12.4  Comes back into the hospital with blood in urine X1 week started out light became darker she still takes a baby aspirin is no longer on blood thinners and was somewhat lightheaded and dizzy  Assessment & Plan:   Hematuria-I discussed patient's case with Dr. Junious Silk who feels at this time there is no need for urological involvement as an inpatient and with stable hemoglobin in the 8 range would ask for 1-2-week follow-up in the office  Anemia of hematoma and prior blood loss-probably secondary to hematoma which is about the same size based on CT scan done in the ED on 4/11-does not meet transfusion shoulder at this time hemoglobin relatively stable in the 8 range  SMA thrombosis, DVT-patient had life-threatening abdominal hematoma needed pressors therefore not a good candidate for anticoagulation-on aspirin which is been held temporarily.  Has IVC filter.  Pain control with oxycodone 10 every 6 as needed  ?  Pyelonephritis-continue aztreonam for now may de-escalate to ceftriaxone in 24 hours  Hypothyroidism not currently on meds  OSA on CPAP-to use at night chronic respiratory failure on oxygen which is to be continued and we will try and wean as tolerated  Bipolar continue Celexa 30 daily, continue trazodone 50 at bedtime for  sleep  Reflux continue Protonix 40 daily  Chronic heart failure-continue Lasix 40 twice daily  DVT prophylaxis: SCDs at this time-contraindicated because of hematuria code Status:   Full   family Communication:   Discussed with son at bedside disposition Plan: Inpatient   Consultants:   None currently discussed with Dr. Junious Silk of urology  Procedures:   None  Antimicrobials:   Aztreonam 4/11  Subjective: Awake alert pleasant no specific clots in the urine no pain tolerating diet no fever no chills no nausea no vomiting  Objective: Vitals:   04/02/18 0127 04/02/18 0435 04/02/18 0738 04/02/18 0800  BP: (!) 131/50 120/66  (!) 124/54  Pulse: 70 77  84  Resp: 20 18  20   Temp: 98.5 F (36.9 C) 98.2 F (36.8 C)  98.1 F (36.7 C)  TempSrc: Oral Oral  Oral  SpO2: 100% 98% 95% 98%  Weight: 93.1 kg (205 lb 4 oz)     Height: 5\' 8"  (1.727 m)       Intake/Output Summary (Last 24 hours) at 04/02/2018 1034 Last data filed at 04/02/2018 0700 Gross per 24 hour  Intake 100 ml  Output 500 ml  Net -400 ml   Filed Weights   04/01/18 1523 04/02/18 0127  Weight: 97.5 kg (215 lb) 93.1 kg (205 lb 4 oz)    Examination: Alert pleasant anxious On oxygen 4 L S1-S2 no murmur rub or gallop, normal sinus rhythm on monitors with some PVCs Chest is clinically clear Abdomen slightly tender lower quadrant where presence of hematoma was there is no rebound no guarding however Trace lower extremity edema   Data Reviewed: I have personally reviewed following  labs and imaging studies  CBC: Recent Labs  Lab 04/01/18 1541 04/02/18 0303 04/02/18 0929  WBC 7.0 7.2  --   NEUTROABS 4.8  --   --   HGB 8.3* 8.9*  8.8* 8.6*  HCT 27.2* 29.3*  29.3* 28.5*  MCV 94.1 93.9  --   PLT 284 313  --    Basic Metabolic Panel: Recent Labs  Lab 04/01/18 1541 04/02/18 0303  NA 139 141  K 3.7 3.3*  CL 95* 95*  CO2 36* 36*  GLUCOSE 99 101*  BUN 11 8  CREATININE 0.81 0.77  CALCIUM 8.0* 8.6*   MG 1.8  --    GFR: Estimated Creatinine Clearance: 70.3 mL/min (by C-G formula based on SCr of 0.77 mg/dL). Liver Function Tests: Recent Labs  Lab 04/01/18 1541  AST 23  ALT 18  ALKPHOS 64  BILITOT 0.6  PROT 5.0*  ALBUMIN 2.2*   Recent Labs  Lab 04/01/18 1541  LIPASE 19   No results for input(s): AMMONIA in the last 168 hours. Coagulation Profile: Recent Labs  Lab 04/01/18 1541  INR 1.12   Cardiac Enzymes:  Radiology Studies: Reviewed images personally in health database   Scheduled Meds: . allopurinol  100 mg Oral Daily  . citalopram  30 mg Oral Daily  . ferrous sulfate  325 mg Oral Q breakfast  . furosemide  40 mg Intravenous BID  . mouth rinse  15 mL Mouth Rinse BID  . pantoprazole  40 mg Oral Daily  . pilocarpine  1 drop Both Eyes BID  . pramipexole  0.25 mg Oral QHS  . roflumilast  500 mcg Oral Daily  . traZODone  150 mg Oral QHS  . umeclidinium bromide  1 puff Inhalation BID   Continuous Infusions: . aztreonam Stopped (04/02/18 0705)     LOS: 1 day    Time spent: [place], MD [facility]  If 7PM-7AM, please contact night-coverage [email or web address] Password [facility] 04/02/2018, 10:34 AM

## 2018-04-02 NOTE — Progress Notes (Signed)
Pt refused CPAP for the night. RT will continue to monitor pt as needed.

## 2018-04-02 NOTE — Progress Notes (Signed)
Text paged MD and still waiting to hear about Echo, also text paged due to patient being in extreme pain and prn pain medication not due yet and no other medication ordered. Awaiting call back

## 2018-04-02 NOTE — Progress Notes (Signed)
Attempted to call Dr.Samtani's personal phone and it went to VM. Still awaiting to hear back

## 2018-04-02 NOTE — Progress Notes (Signed)
Echo tech notified this RN that patient had recent Echo last month.  This RN was to investigate the order and had Christin RN/ CN to follow up and pt was returned to unit during this time.  Per Epic results, it seemed Echo was done but per Patient who is A&O x4 the Echo was not done and she was returned back to her room.  Paged Dr. Verlon Au to discuss further action. Will continue to monitor

## 2018-04-02 NOTE — Consult Note (Signed)
   [facility] CM Inpatient Consult   04/02/2018  [name] Mar 06, 1941 [id number]  Patient evaluated for re-admission now in with pyelonephritis with shortness of breath.  Patient had been active with [facility] for chronic disease management services.  Patient has been engaged by a [employer] and had requested a follow up with [facility] Pharmacist regarding medications which may have changed.  Patient had recently gone to SNF for rehab at  [facility].  Current plan is to return to [facility].  Will follow for progress and disposition and assign staff as appropriate to disposition needs.  Of note, [facility] Care Management services does not replace or interfere with any services that are needed or arranged by inpatient case management or social work.  For additional questions or referrals please contact:  Natividad Brood, RN BSN [facility] Liaison  [phone] business mobile phone Toll free office [phone]

## 2018-04-02 NOTE — Progress Notes (Signed)
I was going to do the echo in the echo lab. While waiting for the patient, I realized she had an echo exactly one month ago. I inquired whether or not she needed another one and had to send the patient back to her room due her needing to urinate with a purewick in and no suction. Please call the echo lab at [phone] should the patient need an echo.

## 2018-04-02 NOTE — Evaluation (Signed)
Physical Therapy Evaluation Patient Details Name: [name] MRN: [id number] DOB: [birth year] Today's Date: 04/02/2018   History of Present Illness  Pt is a 77 y.o. female admitted from [facility] SNF on 04/01/18 with hematuria. Recent admission 3/5 with PE and IVC filter placement; d/c on 4/5 to SNF. PMH includes PVD, OA, HTN, DVT, depression, COPD, CHF, CAD, anxiety, multiple orthopedic sxs.     Clinical Impression  Pt presents with an overall decrease in functional mobility secondary to above. PTA, pt admitted to SNF on 4/5 after prolonged hospitalization for PE. Pt plans to return home with 24/7 support from family and [facility] services; owns all necessary DME. Today, required modA for bed mobility; deferred transfer/amb secondary to pain and dizziness. Son present throughout session and very supportive. Pt very anxious to finally return home. Discussed recommendation for SNF-level therapies; but pt declining. Pt would benefit from continued acute PT services to maximize functional mobility and independence prior to d/c.     Follow Up Recommendations SNF;Supervision/Assistance - 24 hour(pt returning home with family, wants [facility] services)    Equipment Recommendations  None recommended by PT(owns necessary DME)    Recommendations for Other Services       Precautions / Restrictions Precautions Precautions: Fall Restrictions Weight Bearing Restrictions: No      Mobility  Bed Mobility Overal bed mobility: Needs Assistance Bed Mobility: Supine to Sit;Sit to Supine Rolling: Mod assist Sidelying to sit: Mod assist       General bed mobility comments: ModA to assist trunk elevation and scoot BLEs to EOB. ModA to assist LLE back into bed. MaxA to scoot up  Transfers                 General transfer comment: Pt lightheaded in sitting with c/o pain; deferred transfer  Ambulation/Gait                Stairs            Wheelchair Mobility    Modified Rankin (Stroke  Patients Only)       Balance Overall balance assessment: Needs assistance Sitting-balance support: No upper extremity supported;Feet supported Sitting balance-Leahy Scale: Fair                                       Pertinent Vitals/Pain Pain Assessment: Faces Faces Pain Scale: Hurts even more Pain Location: "all over" Pain Descriptors / Indicators: Sore Pain Intervention(s): Monitored during session    Home Living Family/patient expects to be discharged to:: Private residence Living Arrangements: Alone Available Help at Discharge: Family;Available 24 hours/day Type of Home: House Home Access: Stairs to enter Entrance Stairs-Rails: Right Entrance Stairs-Number of Steps: 3 Home Layout: One level Home Equipment: Walker - 2 wheels;Walker - 4 wheels;Cane - single point;Bedside commode;Shower seat;Tub bench;Wheelchair - Biomedical engineer Comments: From SNF, family will be returning pt home with 24/7 support    Prior Function Level of Independence: Needs assistance         Comments: Working with PT/OT at [facility]. Per PT note 1 wk ago, was modA+2 for stand pivot     Hand Dominance        Extremity/Trunk Assessment   Upper Extremity Assessment Upper Extremity Assessment: Generalized weakness    Lower Extremity Assessment Lower Extremity Assessment: Generalized weakness(BLE edema) LLE Sensation: history of peripheral neuropathy    Cervical / Trunk Assessment Cervical / Trunk Assessment:  Kyphotic  Communication   Communication: No difficulties  Cognition Arousal/Alertness: Awake/alert Behavior During Therapy: WFL for tasks assessed/performed Overall Cognitive Status: Within Functional Limits for tasks assessed                                        General Comments General comments (skin integrity, edema, etc.): Son present throughout session    Exercises     Assessment/Plan    PT Assessment Patient needs  continued PT services  PT Problem List Decreased strength;Decreased activity tolerance;Decreased balance;Decreased mobility;Pain       PT Treatment Interventions Gait training;Therapeutic activities;Patient/family education;Wheelchair mobility training;Balance training;Therapeutic exercise;Functional mobility training;DME instruction    PT Goals (Current goals can be found in the Care Plan section)  Acute Rehab PT Goals Patient Stated Goal: Return home, less pain PT Goal Formulation: With patient/family Time For Goal Achievement: 04/16/18 Potential to Achieve Goals: Good    Frequency Min 3X/week   Barriers to discharge        Co-evaluation               AM-PAC PT "6 Clicks" Daily Activity  Outcome Measure Difficulty turning over in bed (including adjusting bedclothes, sheets and blankets)?: Unable Difficulty moving from lying on back to sitting on the side of the bed? : Unable Difficulty sitting down on and standing up from a chair with arms (e.g., wheelchair, bedside commode, etc,.)?: Unable Help needed moving to and from a bed to chair (including a wheelchair)?: A Lot Help needed walking in hospital room?: A Lot Help needed climbing 3-5 steps with a railing? : Total 6 Click Score: 8    End of Session Equipment Utilized During Treatment: Gait belt;Oxygen Activity Tolerance: Patient limited by fatigue;Patient limited by pain Patient left: in bed;with call bell/phone within reach;with bed alarm set;with family/visitor present Nurse Communication: Mobility status PT Visit Diagnosis: Other abnormalities of gait and mobility (R26.89);Muscle weakness (generalized) (M62.81)    Time: 1641-1700 PT Time Calculation (min) (ACUTE ONLY): 19 min   Charges:   PT Evaluation $PT Eval Moderate Complexity: 1 Mod     PT G Codes:       Mabeline Caras, PT, DPT Acute Rehab Services  Pager: [place] 04/02/2018, 5:15 PM

## 2018-04-03 ENCOUNTER — Inpatient Hospital Stay (HOSPITAL_COMMUNITY): Payer: Medicare HMO

## 2018-04-03 DIAGNOSIS — I509 Heart failure, unspecified: Secondary | ICD-10-CM

## 2018-04-03 LAB — CBC WITH DIFFERENTIAL/PLATELET
Basophils Absolute: 0 10*3/uL (ref 0.0–0.1)
Basophils Relative: 0 %
Eosinophils Absolute: 0.1 10*3/uL (ref 0.0–0.7)
Eosinophils Relative: 2 %
HCT: 28.7 % — ABNORMAL LOW (ref 36.0–46.0)
Hemoglobin: 8.6 g/dL — ABNORMAL LOW (ref 12.0–15.0)
Lymphocytes Relative: 16 %
Lymphs Abs: 1.2 10*3/uL (ref 0.7–4.0)
MCH: 28 pg (ref 26.0–34.0)
MCHC: 30 g/dL (ref 30.0–36.0)
MCV: 93.5 fL (ref 78.0–100.0)
Monocytes Absolute: 0.8 10*3/uL (ref 0.1–1.0)
Monocytes Relative: 11 %
Neutro Abs: 5.2 10*3/uL (ref 1.7–7.7)
Neutrophils Relative %: 71 %
Platelets: 321 10*3/uL (ref 150–400)
RBC: 3.07 MIL/uL — ABNORMAL LOW (ref 3.87–5.11)
RDW: 16.8 % — ABNORMAL HIGH (ref 11.5–15.5)
WBC: 7.4 10*3/uL (ref 4.0–10.5)

## 2018-04-03 LAB — ECHOCARDIOGRAM LIMITED
Height: 68 in
Weight: 3276.8 oz

## 2018-04-03 MED ORDER — FERROUS SULFATE 325 (65 FE) MG PO TABS
325.0000 mg | ORAL_TABLET | Freq: Every day | ORAL | Status: DC
  Administered 2018-04-03 – 2018-04-06 (×4): 325 mg via ORAL
  Filled 2018-04-03 (×4): qty 1

## 2018-04-03 NOTE — Evaluation (Addendum)
Occupational Therapy Evaluation Patient Details Name: [name] MRN: [id number] DOB: Jan 02, 1941 Today's Date: 04/03/2018    History of Present Illness Pt is a 77 y.o. female admitted from [facility] on 04/01/18 with hematuria. Recent admission 3/5 with PE and IVC filter placement; d/c on 4/5 to SNF. PMH includes PVD, OA, HTN, DVT, depression, COPD, CHF, CAD, anxiety, multiple orthopedic sxs.    Clinical Impression   Prior to current admission, pt was participating in rehabilitation at [facility]. Prior to recent admission, she was independent with ADL. Pt seen in conjunction with PT to maximize functional gains. Pt currently requires total assist for toileting hygiene, total assist for LB ADL, and mod assist for UB ADL. She was able to stand once with max assist +2 and RW in preparation for ADL participation. However, she was unable to stand on second attempt due to fatigue despite total assistance. Pt currently requiring significant assistance for ADL and functional mobility. Had long discussion concerning level of assistance required if pt/family chooses to return home and that from OT perspective continue to recommend SNF rehabilitation. If pt to D/C home, she will need +2 family assistance 24/7 as well as [employer] fast streamer. At end of session, pt reporting concern over her family's ability to assist and now would like to pursue SNF rehabilitation. Notified RN who notified MD and case management.     Follow Up Recommendations  SNF;Supervision/Assistance - 24 hour    Equipment Recommendations  Other (comment)(will need Hoyer lift if not to d/c to SNF)    Recommendations for Other Services       Precautions / Restrictions Precautions Precautions: Fall Restrictions Weight Bearing Restrictions: No      Mobility Bed Mobility Overal bed mobility: Needs Assistance Bed Mobility: Supine to Sit;Sit to Supine;Rolling Rolling: Mod assist   Supine to sit: Mod assist;+2 for physical  assistance Sit to supine: Mod assist;+2 for physical assistance   General bed mobility comments: Mod A +2 for LE management and trunk assist. Required assist with scooting hips to EOB as well. Mod A to roll from side to side for clean up following BM X2. Rolled multiple times and pt easily fatigued.   Transfers Overall transfer level: Needs assistance Equipment used: Rolling walker (2 wheeled) Transfers: Sit to/from Stand Sit to Stand: Max assist;+2 physical assistance         General transfer comment: Max A +2 for lift assist and steadying. Able to come up to full standing on first attempt, however, on second attempt pt unable to power through LEs secondary to fatigue to come up to full standing. Pt with episode of incontinence during first stand and required assist for cleanup.     Balance Overall balance assessment: Needs assistance Sitting-balance support: No upper extremity supported;Feet supported Sitting balance-Leahy Scale: Fair     Standing balance support: Bilateral upper extremity supported Standing balance-Leahy Scale: Poor Standing balance comment: Reliant on BUE support and external support.                            ADL either performed or assessed with clinical judgement   ADL Overall ADL's : Needs assistance/impaired Eating/Feeding: Supervision/ safety;Bed level   Grooming: Min guard;Sitting   Upper Body Bathing: Sitting;Moderate assistance   Lower Body Bathing: Total assistance;Sit to/from stand   Upper Body Dressing : Moderate assistance;Sitting   Lower Body Dressing: Total assistance;Sit to/from stand   Toilet Transfer: Maximal assistance;+2 for physical  assistance Toilet Transfer Details (indicate cue type and reason): Able to complete sit<>stand with max assist +2. Unable to pivot secondary to urinary incontinence. Unable to stand on second attempt with total assist +2.  Toileting- Clothing Manipulation and Hygiene: Total assistance;Bed  level Toileting - Clothing Manipulation Details (indicate cue type and reason): Pt with bowel movement x2 and assisted in hygiene at bed level with rolls.        General ADL Comments: Pt limited by weakness, urinary and bowel incontinence, and very limited activity tolerance for ADL. Becomes short of breath during even minimal activity.      Vision   Vision Assessment?: No apparent visual deficits Additional Comments: Will continue to assess     Perception     Praxis      Pertinent Vitals/Pain Pain Assessment: Faces Faces Pain Scale: Hurts even more Pain Location: "all over" Pain Descriptors / Indicators: Sore Pain Intervention(s): Limited activity within patient's tolerance;Monitored during session;Repositioned     Hand Dominance     Extremity/Trunk Assessment Upper Extremity Assessment Upper Extremity Assessment: Generalized weakness;LUE deficits/detail LUE Deficits / Details: History of L shoulder failed reverse replacement.    Lower Extremity Assessment Lower Extremity Assessment: Generalized weakness(BLE edema) LLE Sensation: history of peripheral neuropathy   Cervical / Trunk Assessment Cervical / Trunk Assessment: Kyphotic   Communication Communication Communication: No difficulties   Cognition Arousal/Alertness: Awake/alert Behavior During Therapy: WFL for tasks assessed/performed Overall Cognitive Status: Within Functional Limits for tasks assessed                                     General Comments  Pt's daughter, son, and granddaughter present during session. Had lengthy conversation with family including OT, PT, and RN to discuss concerns over pt and family safety and equipment needs for home. Continued to recommend SNF with pt and family and explained level of assistance and equipment needed to facilitate return home. Ultimately pt reporting that she wants to go to SNF and RN to notify case management/social worker.     Exercises      Shoulder Instructions      Home Living Family/patient expects to be discharged to:: Private residence Living Arrangements: Alone Available Help at Discharge: Family;Available 24 hours/day Type of Home: House Home Access: Stairs to enter [employer] of Steps: 3 Entrance Stairs-Rails: Right Home Layout: One level     Bathroom Shower/Tub: Occupational psychologist: Standard     Home Equipment: Environmental consultant - 2 wheels;Walker - 4 wheels;Cane - single point;Bedside commode;Shower seat;Tub bench;Wheelchair - Media planner Comments: From SNF for rehab. Pt and family deciding whether or not to go to SNF versus home with 24 hour assist.       Prior Functioning/Environment Level of Independence: Needs assistance        Comments: Working with OT/PT at [facility]. Required assistance for ADL.         OT Problem List: Decreased strength;Decreased range of motion;Decreased activity tolerance;Impaired balance (sitting and/or standing);Decreased safety awareness;Decreased knowledge of use of DME or AE;Decreased knowledge of precautions;Cardiopulmonary status limiting activity;Pain      OT Treatment/Interventions: Self-care/ADL training;Therapeutic exercise;Energy conservation;DME and/or AE instruction;Therapeutic activities;Patient/family education;Balance training    OT Goals(Current goals can be found in the care plan section) Acute Rehab OT Goals Patient Stated Goal: Return home, less pain OT Goal Formulation: With patient/family Time For Goal Achievement: 04/17/18 Potential to  Achieve Goals: Good ADL Goals Pt Will Perform Grooming: with modified independence;sitting Pt Will Perform Lower Body Dressing: with mod assist;sit to/from stand Pt Will Transfer to Toilet: with min assist;stand pivot transfer;bedside commode Pt Will Perform Toileting - Clothing Manipulation and hygiene: with min assist;sit to/from stand Pt/caregiver will Perform Home Exercise  Program: Increased strength;Both right and left upper extremity;With written HEP provided;With Supervision  OT Frequency: Min 2X/week   Barriers to D/C:            Co-evaluation PT/OT/SLP Co-Evaluation/Treatment: Yes Reason for Co-Treatment: To address functional/ADL transfers;Complexity of the patient's impairments (multi-system involvement);For patient/therapist safety PT goals addressed during session: Mobility/safety with mobility;Balance;Proper use of DME OT goals addressed during session: ADL's and self-care;Strengthening/ROM      AM-PAC PT "6 Clicks" Daily Activity     Outcome Measure Help from another person eating meals?: A Little Help from another person taking care of personal grooming?: A Little Help from another person toileting, which includes using toliet, bedpan, or urinal?: Total Help from another person bathing (including washing, rinsing, drying)?: A Lot Help from another person to put on and taking off regular upper body clothing?: A Lot Help from another person to put on and taking off regular lower body clothing?: Total 6 Click Score: 12   End of Session Equipment Utilized During Treatment: Gait belt;Rolling walker;Oxygen(4L O2) Nurse Communication: Mobility status(RN assisting with clean up)  Activity Tolerance: Patient tolerated treatment well Patient left: with call bell/phone within reach;in bed;with family/visitor present  OT Visit Diagnosis: Other abnormalities of gait and mobility (R26.89);Muscle weakness (generalized) (M62.81)                Time: 1400-1508 OT Time Calculation (min): 68 min Charges:  OT General Charges $OT Visit: 1 Visit OT Evaluation $OT Eval Moderate Complexity: 1 Mod OT Treatments $Self Care/Home Management : 23-37 mins G-Codes:     Norman Herrlich, MS OTR/L  Pager: [place] A Mouhamad Teed 04/03/2018, 4:21 PM

## 2018-04-03 NOTE — Progress Notes (Signed)
Page to MD for verification.  3e14 Neto (dc on hold) proceed with afternoon abx? noticed no orders for abx on AVS.

## 2018-04-03 NOTE — Progress Notes (Signed)
CSW notified that patient now wants rehab placement. CSW spoke with patient and family at bedside (son and daughter). Patient now agreeable to rehab at [facility] or [place]. CSW explained that patient has been discharged and would be at risk for a hospital bill if they remain in the hospital. Patient has [employer], which is closed over the weekend. Only potential Letter of Guarantee SNF would be [facility], which family declined. They requested to appeal discharge to try and stay until Monday because they do not feel the patient is safe to discharge home. RNCM, Debbie, aware.   Judy Locus Myrka Sylva LCSW [phone]

## 2018-04-03 NOTE — Progress Notes (Signed)
Patient's family at bedside expresses frustration with the situation, but reiterates they understand it is not in the nurses hands.   Family expresses displeasure with experience they had with case manager. They express understanding and agreement in regard to patient status; but don't understand what happened leading up to discharge to home if pt wasn't ready. When reminded of pt refusal to accept the bed made available by [facility], family stated how they weren't pleased with the 2 hour drive to the facility in [place] they were going to send the pt.   Though reasoning for family's decisions are very much understood, it doesn't change the placement/insurance/facility requirements that cannot be addressed on a weekend due to appropriate departments unavailable for arrangements to be made for placement.

## 2018-04-03 NOTE — Care Management Note (Signed)
Case Management Note  Patient Details  Name: LEELA VANBROCKLIN MRN: [id number] Date of Birth: Apr 10, 1941  Subjective/Objective:                 Spoke w patient at the bedside. Declines SNF, wants [facility] for [facility], referral accepted by [facility] for [facility] Tuesday. Pateint and granddaughter decline needs for additional DME. Patient requesting [facility] for transport. Papers on chart for nurse tocall when ready for DC. Corey Harold [phone]   Action/Plan:   Expected Discharge Date:  04/03/18               Expected Discharge Plan:  [place]  In-House Referral:     Discharge planning Services  CM Consult  Post Acute Care Choice:  Home Health Choice offered to:  Patient  DME Arranged:    DME Agency:     HH Arranged:  RN, PT [place] Agency:  [facility] (now [facility]  Status of Service:  Completed, signed off  If discussed at [employer] of Stay Meetings, dates discussed:    Additional Comments:  Carles Collet, RN 04/03/2018, 10:50 AM

## 2018-04-03 NOTE — Progress Notes (Signed)
Social work notified that patient agreed to rehab, requested her presence at bedside to discuss with family any and all options. Informed that family was made aware that this would likely not happen until Monday.    MD present on floor when evaluation/agreement was completed, made aware of patient's decision as well.

## 2018-04-03 NOTE — Progress Notes (Addendum)
04/03/18 Patient and family chose to [place].  Witnessed call to [facility], will await contact from [facility] for medical records.  Dr Verlon Au updated. Received FAX from [facility]. Case ID [id number]. Routed or FAXed all requested information to [facility] at [phone].  04/04/18 refaxed DND to above number and [phone]. Fax verifications left on [employer] desk.

## 2018-04-03 NOTE — Progress Notes (Signed)
Patient resting comfortably during shift report. Denies complaints.  

## 2018-04-03 NOTE — Progress Notes (Signed)
  Echocardiogram 2D Echocardiogram has been performed.  [name] 04/03/2018, 10:56 AM

## 2018-04-03 NOTE — Progress Notes (Signed)
OT Cancellation Note  Patient Details Name: [name] MRN: [id number] DOB: 26-Feb-1941   Cancelled Treatment:    Reason Eval/Treat Not Completed: Other (comment): Pt just receiving breakfast on OT arrival. Encouraged pt to participate in bed mobility and transfer to chair to improve safety and ability to participate in self-feeding tasks; however, she declined despite maximum encouragement. Will check back as able.   Norman Herrlich, MS OTR/L  Pager: [phone]   Norman Herrlich 04/03/2018, 8:44 AM

## 2018-04-03 NOTE — Progress Notes (Signed)
Patient wishes to appeal d/c per CM note. Spoke to CM regarding pt's decision to appeal, as to find placement on Monday in a different rehab facility.

## 2018-04-03 NOTE — Discharge Summary (Signed)
Physician Discharge Summary  [name] NIO:[id number] DOB: [birth year] DOA: 04/01/2018  PCP: [name], MD  Admit date: 04/01/2018 Discharge date: 04/03/2018  Time spent: 55 minutes  Recommendations for Outpatient Follow-up:  1.   Discharge Diagnoses:  Principal Problem:   Hematuria Active Problems:   Hypothyroidism   Essential hypertension   COPD (chronic obstructive pulmonary disease) (HCC)   CAD (coronary artery disease)   Dyslipidemia   Pulmonary embolism (HCC)   Acute lower UTI   Hematoma of abdominal wall, subsequent encounter   Discharge Condition: Improved  Diet recommendation: Heart healthy low-salt  Filed Weights   04/01/18 1523 04/02/18 0127 04/03/18 0450  Weight: 97.5 kg (215 lb) 93.1 kg (205 lb 4 oz) 92.9 kg (204 lb 12.8 oz)    History of present illness:  77 year old female, CABG 2004 DES L main, recurrent DVT on chronic Coumadin, HTN, COPD, carotid stenosis, upper GI bleed gastric erosions-colonoscopy pan diverticulitis, asymptomatic cholelithiasis, systolic CHF EF 81% on chronic home oxygen Recent prolonged hospitalization 3/10-03/26/18 with right-sided ischemic colitis acute hypoxic failure with IVC filter placed 03/14/2018-at that admission needed pressor factors briefly secondary to shock-was also found to have a new large anterior abdominal wall hematoma 16.9 X 11.9 X 12.4  Comes back into the hospital with blood in urine X1 week started out light became darker she still takes a baby aspirin is no longer on blood thinners and was somewhat lightheaded and dizzy--CT scan done on admission shows stability of hematoma without increase in size despite use of aspirin    Hospital Course:  Hematuria-I discussed patient's case with Dr. Junious Silk who feels at this time there is no need for urological involvement as an inpatient and with stable hemoglobin in the 8 range  Dr. Junious Silk is okay with the patient being on aspirin for any type of cystoscopic procedure  and sees no contraindication hence this was resumed and I discussed this with family as below  Anemia of hematoma and prior blood loss-probably secondary to hematoma which is about the same size based on CT scan done in the ED on 4/11-does not meet transfusion shoulder at this time hemoglobin relatively stable in the 8 range  SMA thrombosis, DVT-patient had life-threatening abdominal hematoma needed pressors therefore not a good candidate for anticoagulation-on aspirin which is been held temporarily.  Has IVC filter.  Pain control with oxycodone 10 every 6 as needed I had a long discussion with Dr. Donzetta Matters of vascular surgery who suggests continuation of aspirin and I discussed this with the patient clearly  ?  Pyelonephritis-was on Azactam during hospital stay and it was felt that she had no clinical suspicion for pyelonephritis given painless hematuria and negative nitrites and leukocytes therefore I discontinued antibiotics on discharge  Hypothyroidism not currently on meds  OSA on CPAP-to use at night chronic respiratory failure on oxygen which is to be continued  Bipolar continue Celexa 30 daily, continue trazodone 50 at bedtime for sleep  Reflux continue Protonix 40 daily  Chronic heart failure-continue Lasix 40 twice daily    Procedures: Urology Dr. Junious Silk- will set up appointment with the patient Vascular surgery Dr. Donzetta Matters telephone consulted    Discharge Exam: Vitals:   04/03/18 0450 04/03/18 0800  BP: 130/66 (!) 123/52  Pulse: 93   Resp:    Temp: 98.4 F (36.9 C)   SpO2: 97%     General: Awake alert pleasant oriented no distress Cardiovascular: S1-S2 no murmur rub or gallop Respiratory: Clinically clear no added sound  Abdomen soft nontender no rebound no guarding however mons pubis is swollen and painful  Discharge Instructions   Discharge Instructions    Diet - low sodium heart healthy   Complete by:  As directed    Increase activity slowly   Complete  by:  As directed      Allergies as of 04/03/2018      Reactions   Zolpidem Tartrate Anxiety   CONFUSION    Pitavastatin Other (See Comments)   Not sure   Ropinirole Nausea Only   Makes legs jump   Penicillins Rash   Tolerated cephalosporins in past      Medication List    STOP taking these medications   NON FORMULARY   pilocarpine 1 % ophthalmic solution Commonly known as:  PILOCAR     TAKE these medications   allopurinol 100 MG tablet Commonly known as:  ZYLOPRIM Take 100 mg by mouth daily.   aspirin EC 81 MG tablet Take 81 mg by mouth daily.   citalopram 20 MG tablet Commonly known as:  CELEXA Take 30 mg by mouth daily.   [employer] D3 1000 units capsule Generic drug:  Cholecalciferol Take 1,000 Units by mouth daily.   feeding supplement (ENSURE ENLIVE) Liqd Take 237 mLs by mouth 3 (three) times daily between meals.   ferrous sulfate 325 (65 FE) MG tablet Take 325 mg by mouth daily with breakfast.   furosemide 40 MG tablet Commonly known as:  LASIX Take 1 tablet (40 mg total) by mouth 2 (two) times daily.   INCRUSE ELLIPTA 62.5 MCG/INH Aepb Generic drug:  umeclidinium bromide Inhale 1 puff into the lungs 2 (two) times daily.   Ipratropium-Albuterol 20-100 MCG/ACT Aers respimat Commonly known as:  COMBIVENT Inhale 1 puff into the lungs every 6 (six) hours as needed for wheezing.   magnesium oxide 400 (241.3 Mg) MG tablet Commonly known as:  MAG-OX Take 1 tablet (400 mg total) by mouth 2 (two) times daily.   nitroGLYCERIN 0.4 MG SL tablet Commonly known as:  NITROSTAT Place 1 tablet (0.4 mg total) under the tongue every 5 (five) minutes as needed for chest pain. For chest pain   omeprazole 40 MG capsule Commonly known as:  PRILOSEC Take 40 mg by mouth 2 (two) times daily.   Oxycodone HCl 10 MG Tabs Take 1 tablet (10 mg total) by mouth every 6 (six) hours as needed. What changed:  when to take this   potassium chloride SA 20 MEQ tablet Commonly known  as:  K-DUR,KLOR-CON Take 20 mEq by mouth daily.   pramipexole 0.25 MG tablet Commonly known as:  MIRAPEX Take 0.25 mg by mouth at bedtime.   roflumilast 500 MCG Tabs tablet Commonly known as:  DALIRESP Take 500 mcg by mouth daily.   traZODone 50 MG tablet Commonly known as:  DESYREL Take 150 mg by mouth at bedtime.      Allergies  Allergen Reactions  . Zolpidem Tartrate Anxiety    CONFUSION   . Pitavastatin Other (See Comments)    Not sure  . Ropinirole Nausea Only    Makes legs jump  . Penicillins Rash    Tolerated cephalosporins in past      The results of significant diagnostics from this hospitalization (including imaging, microbiology, ancillary and laboratory) are listed below for reference.    Significant Diagnostic Studies: Ct Abdomen Pelvis Wo Contrast  Addendum Date: 03/14/2018   ADDENDUM REPORT: 03/14/2018 04:14 ADDENDUM: These results were called by telephone at the time  of interpretation on 03/14/2018 at 4:04 am to [facility] , who verbally acknowledged these results. Electronically Signed   By: Ulyses Jarred M.D.   On: 03/14/2018 04:14   Result Date: 03/14/2018 CLINICAL DATA:  Abdominal pain and fever EXAM: CT ABDOMEN AND PELVIS WITHOUT CONTRAST TECHNIQUE: Multidetector CT imaging of the abdomen and pelvis was performed following the standard protocol without IV contrast. COMPARISON:  CT abdomen pelvis 03/09/2018 FINDINGS: Examination is degraded by streak artifact from external patient devices and from the arms down positioning of the patient, creating multiple streak artifacts and decreasing sensitivity and specificity of this scan. Lower chest: Right pleural effusion and associated atelectasis. Hepatobiliary: Right upper quadrant ascites. Nodular appearance of the liver. No focal liver lesion. Status post cholecystectomy. Pancreas: Atrophic appearance of the pancreas. Spleen: Normal. Adrenals/Urinary Tract: --Adrenal glands: Normal. --Right kidney/ureter: No  hydronephrosis, perinephric stranding or nephrolithiasis. No obstructing ureteral stones. --Left kidney/ureter: No hydronephrosis, perinephric stranding or nephrolithiasis. No obstructing ureteral stones. --Urinary bladder: Normal appearance for the degree of distention. Stomach/Bowel: --Stomach/Duodenum: No hiatal hernia or other gastric abnormality. Normal duodenal course. --Small bowel: No dilatation or inflammation. --Colon: Diffuse colonic diverticulosis without acute inflammation. --Appendix: Normal. Vascular/Lymphatic: Atherosclerotic calcification is present within the non-aneurysmal abdominal aorta, without hemodynamically significant stenosis. No abdominal or pelvic lymphadenopathy. Reproductive: Pelvis largely obscured by streak artifact. Musculoskeletal. Right total hip arthroplasty. L1-L3 posterior spinal fusion. Other: Large anterior abdominal wall hematoma measuring up to 16.9 x 11.9 x 12.4 cm. This is new compared to the recent study from 03/09/2018. This exerts mass effect on the intra-abdominal contents. IMPRESSION: 1. New, large anterior abdominal wall hematoma measuring 16.9 x 11.9 x 12.4 cm. 2. Small volume ascites with nodular appearance of the liver concerning for cirrhosis. 3. Medium-sized right pleural effusion with associated atelectasis. 4. Diffuse colonic diverticulosis without acute inflammation. 5.  Aortic Atherosclerosis (ICD10-I70.0). Electronically Signed: By: Ulyses Jarred M.D. On: 03/14/2018 03:45   Dg Chest 2 View  Result Date: 04/01/2018 CLINICAL DATA:  Chest pain and shortness of breath EXAM: CHEST - 2 VIEW COMPARISON:  03/18/2018, 03/15/2018, 03/13/2018 FINDINGS: Status post right shoulder replacement. Post sternotomy changes. Calcified breast implants. Small bilateral pleural effusions. Cardiomegaly with vascular congestion and mild diffuse interstitial opacity consistent with edema. Apical scarring. IMPRESSION: 1. Cardiomegaly with vascular congestion and mild  interstitial pulmonary edema and small pleural effusions. Electronically Signed   By: Donavan Foil M.D.   On: 04/01/2018 17:08   Ct Angio Chest Pe W And/or Wo Contrast  Result Date: 04/01/2018 CLINICAL DATA:  Increased shortness of breath. Recently admitted for pulmonary embolism. Patient also complains of hematuria. EXAM: CT ANGIOGRAPHY CHEST CT ABDOMEN AND PELVIS WITH CONTRAST TECHNIQUE: Multidetector CT imaging of the chest was performed using the standard protocol during bolus administration of intravenous contrast. Multiplanar CT image reconstructions and MIPs were obtained to evaluate the vascular anatomy. Multidetector CT imaging of the abdomen and pelvis was performed using the standard protocol during bolus administration of intravenous contrast. CONTRAST:  174mL ISOVUE-370 IOPAMIDOL (ISOVUE-370) INJECTION 76% COMPARISON:  CT abdomen pelvis dated March 14, 2018. CT chest dated February 28, 2018. FINDINGS: CTA CHEST FINDINGS Cardiovascular: Satisfactory opacification of the pulmonary arteries to the segmental level. No evidence of pulmonary embolism. Previously seen pulmonary emboli in the left lower lobe have resolved. Mild left atrial enlargement. No pericardial effusion. Normal caliber thoracic aorta. Coronary, aortic arch, and branch vessel atherosclerotic vascular disease. Mediastinum/Nodes: No enlarged mediastinal, hilar, or axillary lymph nodes. Thyroid gland, trachea, and esophagus  demonstrate no significant findings. Lungs/Pleura: Unchanged moderate right and trace left pleural effusions. Right greater than left lower lobe subsegmental atelectasis. No consolidation or pneumothorax. Unchanged biapical pleuroparenchymal scarring. No suspicious pulmonary nodule. Musculoskeletal: Calcified bilateral breast implants. No acute or significant osseous findings. Review of the MIP images confirms the above findings. CT ABDOMEN and PELVIS FINDINGS Hepatobiliary: No focal liver abnormality is seen. Status  post cholecystectomy. No biliary dilatation. Pancreas: Severely atrophic. No surrounding inflammatory changes or ductal dilatation. Spleen: Normal in size without focal abnormality. Adrenals/Urinary Tract: The adrenal glands are unremarkable. Small bilateral renal cysts are unchanged. No renal or ureteral calculi. No hydronephrosis. The bladder is unremarkable. Stomach/Bowel: Small hiatal hernia. The stomach is otherwise within normal limits. Normal appendix. No bowel wall thickening, distention, or surrounding inflammatory changes. Colonic diverticulosis. Vascular/Lymphatic: Interval placement of an IVC filter. Unchanged diffuse aortic and branch vessel atherosclerotic vascular calcifications. No enlarged abdominal or pelvic lymph nodes. Reproductive: No adnexal mass. Other: No significant interval change in size of the large anterior abdominal wall hematoma also involving the space of Retzius, now measuring 11.0 x 16.7 x 12.6 cm, previously 11.9 x 16.9 x 12.4 cm. No free fluid or pneumoperitoneum. Musculoskeletal: No acute or significant osseous findings. Prior right total hip arthroplasty and lumbar spine posterior decompression and fusion. Review of the MIP images confirms the above findings. IMPRESSION: 1. No evidence of pulmonary embolism. Interval resolution of previously seen left lower lobe pulmonary emboli. 2. Unchanged moderate right and trace left pleural effusions with adjacent bibasilar atelectasis. 3. Relatively unchanged large anterior abdominal wall hematoma involving the space of Retzius, measuring 11.0 x 16.7 x 12.6 cm. 4.  Aortic atherosclerosis (ICD10-I70.0). Electronically Signed   By: Titus Dubin M.D.   On: 04/01/2018 19:07   Ct Abdomen Pelvis W Contrast  Result Date: 04/01/2018 CLINICAL DATA:  Increased shortness of breath. Recently admitted for pulmonary embolism. Patient also complains of hematuria. EXAM: CT ANGIOGRAPHY CHEST CT ABDOMEN AND PELVIS WITH CONTRAST TECHNIQUE:  Multidetector CT imaging of the chest was performed using the standard protocol during bolus administration of intravenous contrast. Multiplanar CT image reconstructions and MIPs were obtained to evaluate the vascular anatomy. Multidetector CT imaging of the abdomen and pelvis was performed using the standard protocol during bolus administration of intravenous contrast. CONTRAST:  168mL ISOVUE-370 IOPAMIDOL (ISOVUE-370) INJECTION 76% COMPARISON:  CT abdomen pelvis dated March 14, 2018. CT chest dated February 28, 2018. FINDINGS: CTA CHEST FINDINGS Cardiovascular: Satisfactory opacification of the pulmonary arteries to the segmental level. No evidence of pulmonary embolism. Previously seen pulmonary emboli in the left lower lobe have resolved. Mild left atrial enlargement. No pericardial effusion. Normal caliber thoracic aorta. Coronary, aortic arch, and branch vessel atherosclerotic vascular disease. Mediastinum/Nodes: No enlarged mediastinal, hilar, or axillary lymph nodes. Thyroid gland, trachea, and esophagus demonstrate no significant findings. Lungs/Pleura: Unchanged moderate right and trace left pleural effusions. Right greater than left lower lobe subsegmental atelectasis. No consolidation or pneumothorax. Unchanged biapical pleuroparenchymal scarring. No suspicious pulmonary nodule. Musculoskeletal: Calcified bilateral breast implants. No acute or significant osseous findings. Review of the MIP images confirms the above findings. CT ABDOMEN and PELVIS FINDINGS Hepatobiliary: No focal liver abnormality is seen. Status post cholecystectomy. No biliary dilatation. Pancreas: Severely atrophic. No surrounding inflammatory changes or ductal dilatation. Spleen: Normal in size without focal abnormality. Adrenals/Urinary Tract: The adrenal glands are unremarkable. Small bilateral renal cysts are unchanged. No renal or ureteral calculi. No hydronephrosis. The bladder is unremarkable. Stomach/Bowel: Small hiatal hernia.  The stomach is  otherwise within normal limits. Normal appendix. No bowel wall thickening, distention, or surrounding inflammatory changes. Colonic diverticulosis. Vascular/Lymphatic: Interval placement of an IVC filter. Unchanged diffuse aortic and branch vessel atherosclerotic vascular calcifications. No enlarged abdominal or pelvic lymph nodes. Reproductive: No adnexal mass. Other: No significant interval change in size of the large anterior abdominal wall hematoma also involving the space of Retzius, now measuring 11.0 x 16.7 x 12.6 cm, previously 11.9 x 16.9 x 12.4 cm. No free fluid or pneumoperitoneum. Musculoskeletal: No acute or significant osseous findings. Prior right total hip arthroplasty and lumbar spine posterior decompression and fusion. Review of the MIP images confirms the above findings. IMPRESSION: 1. No evidence of pulmonary embolism. Interval resolution of previously seen left lower lobe pulmonary emboli. 2. Unchanged moderate right and trace left pleural effusions with adjacent bibasilar atelectasis. 3. Relatively unchanged large anterior abdominal wall hematoma involving the space of Retzius, measuring 11.0 x 16.7 x 12.6 cm. 4.  Aortic atherosclerosis (ICD10-I70.0). Electronically Signed   By: Titus Dubin M.D.   On: 04/01/2018 19:07   Ir Ivc Filter Plmt / S&i /img Guid/mod Sed  Result Date: 03/14/2018 INDICATION: 77 year old with history of pulmonary embolism and left lower extremity DVT. Patient now has a large abdominal wall hematoma and no longer candidate for anticoagulation. EXAM: IVC FILTER PLACEMENT; IVC VENOGRAM; ULTRASOUND FOR VASCULAR ACCESS Physician: Stephan Minister. Anselm Pancoast, MD MEDICATIONS: None. ANESTHESIA/SEDATION: Fentanyl 25 mcg IV; Versed 0.5 mg IV Moderate Sedation Time:  22 minutes The patient was continuously monitored during the procedure by the interventional radiology nurse under my direct supervision. CONTRAST:  Carbon dioxide FLUOROSCOPY TIME:  Fluoroscopy Time: 7 minutes  6 seconds (327 mGy). COMPLICATIONS: None immediate. PROCEDURE: Informed consent was obtained for an IVC venogram and filter placement. Ultrasound demonstrated a patent right internal jugular vein. Ultrasound images were obtained for documentation. The right side of the neck was prepped and draped in a sterile fashion. Maximal barrier sterile technique was utilized including caps, mask, sterile gowns, sterile gloves, sterile drape, hand hygiene and skin antiseptic. The skin was anesthetized with 1% lidocaine. A 21 gauge needle was directed into the vein with ultrasound guidance and a micropuncture dilator set was placed. A wire was advanced into the IVC. The filter sheath was advanced over the wire into the IVC. An IVC venogram was performed with carbon dioxide. Limited evaluation of the IVC due to carbon dioxide and the lumbar surgical hardware. Therefore, a MPA catheter was used to cannulate bilateral renal veins. Renal venography was also performed with carbon dioxide bilaterally. The location of the renal veins were confidently identified. Fluoroscopic images were obtained for documentation. A Bard Denali filter was deployed in the IVC below the lowest renal vein. Follow-up images were obtained to confirm adequate deployment of the filter. Vascular sheath was removed with manual compression. FINDINGS: IVC was patent. Bilateral renal veins were identified. The filter was deployed below the lowest renal vein. IMPRESSION: Successful placement of a retrievable IVC filter. PLAN: This IVC filter is potentially retrievable. The patient will be assessed for filter retrieval by Interventional Radiology in approximately 8-12 weeks. Further recommendations regarding filter retrieval, continued surveillance or declaration of device permanence, will be made at that time. Electronically Signed   By: Markus Daft M.D.   On: 03/14/2018 13:03   US Renal  Result Date: 03/15/2018 CLINICAL DATA:  Acute kidney injury. EXAM: RENAL /  URINARY TRACT ULTRASOUND COMPLETE COMPARISON:  CT, 03/14/2018. FINDINGS: Right Kidney: Length: 8.6 cm. Diffuse cortical thinning. Increased renal  parenchymal echogenicity. There is a 2.6 x 2.0 x 2.3 cm cyst arising from the lower pole. No other masses, no stones and no hydronephrosis. Left Kidney: Length: 9.2 cm. Mild diffuse cortical thinning. Borderline increased renal parenchymal echogenicity. No mass, stone or hydronephrosis. Lower pole partly obscured by overlying bowel gas. Bladder: Decompressed with a Foley catheter.  Not well assessed. Patient also has ascites and a right pleural effusion. IMPRESSION: 1. Findings consistent with medical renal disease with renal cortical thinning, greater on the right, and increased renal parenchymal echogenicity on the right and borderline increased renal parenchymal echogenicity on the left. 2. No acute findings.  No hydronephrosis. 3. 2.6 cm right lower pole renal cyst. Electronically Signed   By: Lajean Manes M.D.   On: 03/15/2018 15:45   Dg Chest Port 1 View  Result Date: 03/18/2018 CLINICAL DATA:  Dyspnea, bilateral leg and arm swelling, abdominal pain and nausea for the past 3-4 days. History of COPD, CHF, and previous CABG. EXAM: PORTABLE CHEST 1 VIEW COMPARISON:  Portable chest x-ray of March 15, 2018 FINDINGS: The lungs are well-expanded. There is no focal infiltrate. There is no pleural effusion. The cardiac silhouette is enlarged. The pulmonary vascularity is not engorged. There are post CABG changes. There is calcification in the wall of the thoracic aorta. There are bilateral breast implants exhibiting capsular calcification. IMPRESSION: COPD. Mild cardiomegaly. Previous CABG. No pulmonary edema, pneumonia, or pleural effusion. Thoracic aortic atherosclerosis. Electronically Signed   By: David  Martinique M.D.   On: 03/18/2018 09:15   Dg Chest Port 1 View  Result Date: 03/15/2018 CLINICAL DATA:  Pleural effusion. EXAM: PORTABLE CHEST 1 VIEW COMPARISON:   Radiograph of March 13, 2018. FINDINGS: The heart size and mediastinal contours are within normal limits. Atherosclerosis of thoracic aorta is noted. Status post coronary artery bypass graft. No pneumothorax is noted. Increased right basilar opacity is noted concerning for worsening atelectasis or infiltrate with associated pleural effusion. Status post right shoulder arthroplasty. IMPRESSION: Increased right basilar atelectasis or infiltrate is noted with possible associated pleural effusion. Aortic Atherosclerosis (ICD10-I70.0). Electronically Signed   By: Marijo Conception, M.D.   On: 03/15/2018 07:45   Dg Chest Port 1 View  Result Date: 03/13/2018 CLINICAL DATA:  77 year old with shortness of breath. EXAM: PORTABLE CHEST 1 VIEW COMPARISON:  Chest CT 02/28/2018 and chest radiograph 11/08/2017 FINDINGS: Bilateral breast implants. No focal airspace disease or pulmonary edema. Right shoulder replacement. Atherosclerotic calcifications at the aortic arch. Prior CABG procedure. Sclerosis and degenerative changes at the left shoulder. Surgical hardware in the lumbar spine. Heart size is within normal limits. IMPRESSION: No acute chest findings. Electronically Signed   By: Markus Daft M.D.   On: 03/13/2018 12:52   Dg Abd 2 Views  Result Date: 03/12/2018 CLINICAL DATA:  Lower abdominal pain beginning this morning post vascular surgery EXAM: ABDOMEN - 2 VIEW COMPARISON:  03/08/2018 FINDINGS: Normal bowel gas pattern. No bowel dilatation or bowel wall thickening. Gas present in rectum. Bones demineralized with evidence of prior lumbar fusion and prior RIGHT hip replacement. Scattered degenerative changes of lower thoracic spine. Surgical clips RIGHT upper quadrant question cholecystectomy. Degenerative changes LEFT hip joint. Question vascular calcification versus 8 mm diameter calculus at upper pole LEFT kidney. IMPRESSION: No acute abnormalities. Electronically Signed   By: Lavonia Dana M.D.   On: 03/12/2018 19:28    Dg Abd Portable 1v  Result Date: 03/16/2018 CLINICAL DATA:  Abdominal pain EXAM: PORTABLE ABDOMEN - 1 VIEW COMPARISON:  None.  FINDINGS: There is no bowel dilatation or air-fluid level to suggest bowel obstruction. No free air. There is a total hip replacement on the right as well as postoperative change in the lumbar region. There is a filter in the inferior vena cava. There are calcified breast implants bilaterally. IMPRESSION: No demonstrable bowel obstruction or free air. Electronically Signed   By: Lowella Grip III M.D.   On: 03/16/2018 11:37   Dg Abd Portable 1v  Result Date: 03/08/2018 CLINICAL DATA:  Nausea and vomiting EXAM: PORTABLE ABDOMEN - 1 VIEW COMPARISON:  CT 02/28/2018 FINDINGS: Surgical hardware in the upper lumbar spine. Surgical clips in the right upper quadrant. Nonobstructed bowel-gas pattern. Radiopaque material in the bladder. Probable diverticulum on the right side of the bladder. Status post right hip replacement with fixating screw paralleling the cortex of the right inter pelvis. IMPRESSION: 1. Nonobstructed gas pattern 2. Contrast material within the bladder Electronically Signed   By: Donavan Foil M.D.   On: 03/08/2018 22:30   Ct Angio Abd/pel W/ And/or W/o  Result Date: 03/09/2018 CLINICAL DATA:  Upper abdominal pain and nausea after mesenteric stent placement today. EXAM: CTA ABDOMEN AND PELVIS wITHOUT AND WITH CONTRAST TECHNIQUE: Multidetector CT imaging of the abdomen and pelvis was performed using the standard protocol during bolus administration of intravenous contrast. Multiplanar reconstructed images and MIPs were obtained and reviewed to evaluate the vascular anatomy. CONTRAST:  100 cc Isovue 370 IV COMPARISON:  Abdomen/pelvis CT 02/28/2018 FINDINGS: VASCULAR Aorta: Moderate atherosclerosis. Normal caliber aorta without aneurysm, dissection, vasculitis or significant stenosis. Celiac: Plaque at the origin causes approximately 50% stenosis. No dissection or  vasculitis. Distal branches are patent. SMA: Patent proximal SMA stent with resolved origin stenosis. Calcified distally but patent. No evidence of dissection. No evidence of contrast extravasation. Renals: Calcified plaque at the origins. No dissection or vasculitis. IMA: Patent without evidence of aneurysm, dissection, vasculitis or significant stenosis. Inflow: Patent without evidence of aneurysm, dissection, vasculitis or significant stenosis. Proximal Outflow: Bilateral common femoral and visualized portions of the superficial and profunda femoral arteries are patent without evidence of aneurysm, dissection, vasculitis or significant stenosis. No evidence of pseudoaneurysm formation or active extravasation at the puncture site. Veins: Not well assessed on arterial phase imaging. There is presumed mixing of opacified and non-opacified blood in the mesenteric venous confluence. Review of the MIP images confirms the above findings. NON-VASCULAR Lower chest: Linear atelectasis in both lower lobes. Trace right pleural effusion. Coronary artery calcifications. Peripherally calcified left breast implant partially included. Hepatobiliary: Slight capsular nodularity raises concern for cirrhosis. No discrete focal lesion. Postcholecystectomy with grossly unchanged biliary prominence allowing for arterial phase technique. Pancreas: Parenchymal atrophy. No ductal dilatation or inflammation. Spleen: Normal in size without focal abnormality. Adrenals/Urinary Tract: Mild left adrenal thickening. Normal right adrenal gland. Excreted IV contrast in both kidneys and urinary bladder from angiogram earlier this day. No hydronephrosis or perinephric edema. Bilateral renal cysts. Stomach/Bowel: Small hiatal hernia. Stomach is nondistended. Presence of intra-abdominal ascites limits bowel evaluation. There is colonic wall thickening involving the ascending colon, hepatic flexure, unlikely proximal transverse colon. No definite small  bowel inflammation. Colonic diverticulosis throughout the colon. Lymphatic: No definite adenopathy. No evidence of retroperitoneal hemorrhage. Reproductive: Uterus obscured by right hip arthroplasty. Other: Development of moderate volume abdominopelvic ascites. Fluid may be minimally complex, however does not represent values concerning for hemorrhage. There is mild mesenteric edema. No free air. Small supraumbilical hernia contains fat and small amount of free fluid. Musculoskeletal: Postsurgical change in  the lumbar spine, unchanged from prior. Right hip arthroplasty. IMPRESSION: VASCULAR 1. Placement of superior mesenteric artery stent which is widely patent. No evidence of dissection or postprocedural pseudoaneurysm/hematoma. 2. Diffuse aortic and branch atherosclerosis. NON-VASCULAR 1. Interval development of moderate abdominopelvic ascites from prior exam. Fluid is minimally complex but does not represent hemorrhage. 2. Colonic wall thickening involving the cecum, ascending, and proximal transverse colon. This is new since prior CT. Exact etiology is uncertain. Infectious or inflammatory etiologies are considered, colonic wall edema related to reperfusion could have a similar appearance. No increased density to suggest bowel wall hemorrhage. 3. Colonic diverticulosis without specific findings to suggest diverticulitis. 4. Question of nodular hepatic contours raises concern for cirrhosis. Recommend correlation with cirrhosis risk factors. Electronically Signed   By: Jeb Levering M.D.   On: 03/09/2018 03:18   Ir Paracentesis  Result Date: 03/10/2018 INDICATION: Recent superior mesenteric artery stent placement. New ascites. Request for diagnostic paracentesis. EXAM: ULTRASOUND GUIDED RIGHT LATERAL ABDOMEN PARACENTESIS MEDICATIONS: 2% lidocaine = 10 mL COMPLICATIONS: None immediate. PROCEDURE: Informed written consent was obtained from the patient after a discussion of the risks, benefits and alternatives  to treatment. A timeout was performed prior to the initiation of the procedure. Initial ultrasound scanning demonstrates a small amount of ascites within the right lateral abdomen. The right lateral abdomen was prepped and draped in the usual sterile fashion. 1% lidocaine with epinephrine was used for local anesthesia. Following this, a 19 gauge, 7-cm, Yueh catheter was introduced. An ultrasound image was saved for documentation purposes. The paracentesis was performed. The catheter was removed and a dressing was applied. The patient tolerated the procedure well without immediate post procedural complication. FINDINGS: A total of approximately 100 ml of clear red fluid was removed. Samples were sent to the laboratory as requested by the clinical team. IMPRESSION: Successful ultrasound-guided paracentesis yielding 160mL of peritoneal fluid. Read by: Gareth Eagle, PA-C Electronically Signed   By: Lucrezia Europe M.D.   On: 03/10/2018 11:13    Microbiology: Recent Results (from the past 240 hour(s))  Blood culture (routine x 2)     Status: None (Preliminary result)   Collection Time: 04/02/18 12:09 AM  Result Value Ref Range Status   Specimen Description BLOOD RIGHT ANTECUBITAL  Final   Special Requests   Final    BOTTLES DRAWN AEROBIC AND ANAEROBIC Blood Culture adequate volume   Culture   Final    NO GROWTH < 12 HOURS Performed at [facility], [address], [place], [place] [address]    Report Status PENDING  Incomplete  Blood culture (routine x 2)     Status: None (Preliminary result)   Collection Time: 04/02/18 12:14 AM  Result Value Ref Range Status   Specimen Description BLOOD LEFT HAND  Final   Special Requests   Final    BOTTLES DRAWN AEROBIC AND ANAEROBIC Blood Culture adequate volume   Culture   Final    NO GROWTH < 12 HOURS Performed at [facility], [place] [address] [place], [place] [address]    Report Status PENDING  Incomplete  MRSA PCR Screening     Status: None    Collection Time: 04/02/18  3:42 AM  Result Value Ref Range Status   MRSA by PCR NEGATIVE NEGATIVE Final    Comment:        The GeneXpert MRSA Assay (FDA approved for NASAL specimens only), is one component of a comprehensive MRSA colonization surveillance program. It is not intended  to diagnose MRSA infection nor to guide or monitor treatment for MRSA infections. Performed at [facility], [place] [address], [place], [place] [address]      Labs: Basic Metabolic Panel: Recent Labs  Lab 04/01/18 1541 04/02/18 0303  NA 139 141  K 3.7 3.3*  CL 95* 95*  CO2 36* 36*  GLUCOSE 99 101*  BUN 11 8  CREATININE 0.81 0.77  CALCIUM 8.0* 8.6*  MG 1.8  --    Liver Function Tests: Recent Labs  Lab 04/01/18 1541  AST 23  ALT 18  ALKPHOS 64  BILITOT 0.6  PROT 5.0*  ALBUMIN 2.2*   Recent Labs  Lab 04/01/18 1541  LIPASE 19   No results for input(s): AMMONIA in the last 168 hours. CBC: Recent Labs  Lab 04/01/18 1541 04/02/18 0303 04/02/18 0929 04/03/18 0703  WBC 7.0 7.2  --  7.4  NEUTROABS 4.8  --   --  5.2  HGB 8.3* 8.9*  8.8* 8.6* 8.6*  HCT 27.2* 29.3*  29.3* 28.5* 28.7*  MCV 94.1 93.9  --  93.5  PLT 284 313  --  321   Cardiac Enzymes: No results for input(s): CKTOTAL, CKMB, CKMBINDEX, TROPONINI in the last 168 hours. BNP: BNP (last 3 results) Recent Labs    03/13/18 1329 03/18/18 1032 04/01/18 1541  BNP 220.9* 362.6* 194.0*    ProBNP (last 3 results) Recent Labs    08/17/17 1038 09/14/17 0921  PROBNP 612 503    CBG: No results for input(s): GLUCAP in the last 168 hours.     Signed:  Nita Sells MD   [facility] 04/03/2018, 9:23 AM

## 2018-04-03 NOTE — Care Management Important Message (Signed)
Important Message  Patient Details  Name: [name] MRN: [id number] Date of Birth: 15-Sep-1941   Medicare Important Message Given:  Yes    Carles Collet, RN 04/03/2018, 4:50 PM

## 2018-04-03 NOTE — Progress Notes (Signed)
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, HTN, COPD, carotid stenosis, upper GI bleed gastric erosions-colonoscopy pan diverticulitis, asymptomatic cholelithiasis, systolic CHF EF 37% on chronic home oxygen Recent prolonged hospitalization 3/10-03/26/18 with right-sided ischemic colitis acute hypoxic failure with IVC filter placed 03/14/2018-at that admission needed pressor factors briefly secondary to shock-was also found to have a new large anterior abdominal wall hematoma 16.9 X 11.9 X 12.4  Comes back into the hospital with blood in urine X1 week started out light became darker she still takes a baby aspirin is no longer on blood thinners and was somewhat lightheaded and dizzy  Assessment & Plan:   Hematuria-I discussed patient's case with Dr. Junious Silk who feels at this time there is no need for urological involvement as an inpatient and with stable hemoglobin in the 8 range would ask for 1-2-week follow-up in the office Patient is okay to use aspirin or Plavix from perspective of urologist to cystoscopy and I also discussed this with the vascular surgeon who recommends that patient be on some type of anticoagulation going forward  Anemia of hematoma and prior blood loss-probably secondary to hematoma which is about the same size based on CT scan done in the ED on 4/11-does not meet transfusion-hemoglobin relatively stable in the 8 range  SMA thrombosis, DVT-patient had life-threatening abdominal hematoma needed pressors therefore not a good candidate for anticoagulation-on aspirin which is been held temporarily.  Has IVC filter.  Pain control with oxycodone 10 every 6 as needed  ?  Pyelonephritis-continue aztreonam for now may de-escalate to ceftriaxone in 24 hours  Hypothyroidism not currently on meds  OSA on CPAP-to use  at night chronic respiratory failure on oxygen which is to be continued and we will try and wean as tolerated  Bipolar continue Celexa 30 daily, continue trazodone 50 at bedtime for sleep  Reflux continue Protonix 40 daily  Chronic heart failure-continue Lasix 40 twice daily  DVT prophylaxis: SCDs at this time-contraindicated because of hematuria code Status:   Full   family Communication:   Discussed with son at bedside disposition Plan: Inpatient   Consultants:   None currently discussed with Dr. Junious Silk of urology  Discussed with vascular surgeon as well Dr. Donzetta Matters  Procedures:   None  Antimicrobials:   Aztreonam 4/11  Subjective:  Awake alert pleasant no distress eating and drinking  Objective: Vitals:   04/03/18 0025 04/03/18 0450 04/03/18 0800 04/03/18 1136  BP: 128/60 130/66 (!) 123/52 (!) 109/43  Pulse: 92 93  88  Resp: 18   14  Temp: 98.7 F (37.1 C) 98.4 F (36.9 C)  99 F (37.2 C)  TempSrc: Oral Oral  Oral  SpO2: 96% 97%  100%  Weight:  92.9 kg (204 lb 12.8 oz)    Height:        Intake/Output Summary (Last 24 hours) at 04/03/2018 1631 Last data filed at 04/03/2018 1500 Gross per 24 hour  Intake 800 ml  Output 950 ml  Net -150 ml   Filed Weights   04/01/18 1523 04/02/18 0127 04/03/18 0450  Weight: 97.5 kg (215 lb) 93.1 kg (205 lb 4 oz) 92.9 kg (204 lb 12.8 oz)    Examination: Alert pleasant anxious On oxygen 4 L S1-S2 no murmur rub or gallop, normal sinus rhythm on monitors with some PVCs  Chest is clinically clear Abdomen slightly tender lower quadrant where presence of hematoma was there is no rebound no guarding however 1+ edema   Data Reviewed: I have personally reviewed following labs and imaging studies  CBC: Recent Labs  Lab 04/01/18 1541 04/02/18 0303 04/02/18 0929 04/03/18 0703  WBC 7.0 7.2  --  7.4  NEUTROABS 4.8  --   --  5.2  HGB 8.3* 8.9*  8.8* 8.6* 8.6*  HCT 27.2* 29.3*  29.3* 28.5* 28.7*  MCV 94.1 93.9  --  93.5   PLT 284 313  --  563   Basic Metabolic Panel: Recent Labs  Lab 04/01/18 1541 04/02/18 0303  NA 139 141  K 3.7 3.3*  CL 95* 95*  CO2 36* 36*  GLUCOSE 99 101*  BUN 11 8  CREATININE 0.81 0.77  CALCIUM 8.0* 8.6*  MG 1.8  --    GFR: Estimated Creatinine Clearance: 70.2 mL/min (by C-G formula based on SCr of 0.77 mg/dL). Liver Function Tests: Recent Labs  Lab 04/01/18 1541  AST 23  ALT 18  ALKPHOS 64  BILITOT 0.6  PROT 5.0*  ALBUMIN 2.2*   Recent Labs  Lab 04/01/18 1541  LIPASE 19   No results for input(s): AMMONIA in the last 168 hours. Coagulation Profile: Recent Labs  Lab 04/01/18 1541  INR 1.12   Cardiac Enzymes:  Radiology Studies: Reviewed images personally in health database   Scheduled Meds: . allopurinol  100 mg Oral Daily  . citalopram  30 mg Oral Daily  . ferrous sulfate  325 mg Oral Q breakfast  . furosemide  40 mg Intravenous BID  . mouth rinse  15 mL Mouth Rinse BID  . pantoprazole  40 mg Oral Daily  . pilocarpine  1 drop Both Eyes BID  . pramipexole  0.25 mg Oral QHS  . roflumilast  500 mcg Oral Daily  . traZODone  150 mg Oral QHS  . umeclidinium bromide  1 puff Inhalation BID   Continuous Infusions: . aztreonam Stopped (04/03/18 0701)     LOS: 2 days    Time spent: [place], MD [facility]  If 7PM-7AM, please contact night-coverage [email or web address] Password [facility] 04/03/2018, 4:31 PM

## 2018-04-03 NOTE — NC FL2 (Signed)
[place] MEDICAID FL2 LEVEL OF CARE SCREENING TOOL     IDENTIFICATION  Patient Name: [name] Birthdate: 29-Oct-1941 Sex: female Admission Date (Current Location): 04/01/2018  [facility] and [place] Number:  Herbalist and Address:  [facility], [place] [address], [place], [place] [address]      Provider Number: [id number]  Attending Physician Name and Address:  Nita Sells, MD  Relative Name and Phone Number:  Anessia [place], son, [phone]    Current Level of Care: Hospital Recommended Level of Care: [place] Prior Approval Number:    Date Approved/Denied:   PASRR Number: [id number]  Discharge Plan: SNF    Current Diagnoses: Patient Active Problem List   Diagnosis Date Noted  . Hematuria 04/01/2018  . Acute lower UTI 04/01/2018  . Hematoma of abdominal wall, subsequent encounter 04/01/2018  . Chronic diastolic heart failure ([place]) 03/29/2018  . Acute deep vein thrombosis (DVT) of left femoral vein ([place]) 03/29/2018  . GERD without esophagitis 03/29/2018  . Chronic gout 03/29/2018  . Chronic bilateral low back pain without sciatica 03/29/2018  . Hypomagnesemia 03/29/2018  . Pain   . ARF (acute renal failure) ([place]) 03/15/2018  . Hemorrhagic shock ([place])   . Acute respiratory distress 03/13/2018  . Colitis 03/10/2018  . Abnormal CT of the abdomen 03/10/2018  . AKI (acute kidney injury) ([place])   . Ascites   . Pulmonary embolism ([place]) 03/01/2018  . 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 Acute on chronic respiratory failure with hypoxia ([place]) 03/01/2018  . History of laparoscopic cholecystectomy 12/07/2017  . Heme positive stool   . Angiodysplasia of intestine   . Hematochezia   . Malnutrition of moderate degree 02/15/2017  . GI bleed 02/13/2017  . Blood loss anemia 02/13/2017  . Impingement syndrome of left shoulder 02/10/2013  . Chest pain 11/17/2011  . PAD (peripheral artery disease) ([place])  11/17/2011  . Dyslipidemia 10/17/2011  . CAD (coronary artery disease)   . DEEP VENOUS THROMBOPHLEBITIS 12/27/2008  . Hypothyroidism 05/03/2008  . Essential hypertension 05/03/2008  . EMPHYSEMA 05/03/2008  . OBSTRUCTIVE SLEEP APNEA 05/03/2008  . COPD (chronic obstructive pulmonary disease) ([place]) 04/12/2008    Orientation RESPIRATION BLADDER Height & Weight     Self, Time, Situation, Place  O2(Nasal cannula 4L) Continent, External catheter Weight: 92.9 kg (204 lb 12.8 oz) Height:  5\' 8"  (172.7 cm)  BEHAVIORAL SYMPTOMS/MOOD NEUROLOGICAL BOWEL NUTRITION STATUS      Continent Diet(Please see DC Summary)  AMBULATORY STATUS COMMUNICATION OF NEEDS Skin   Extensive Assist Verbally Normal                       Personal Care Assistance Level of Assistance  Bathing, Feeding, Dressing Bathing Assistance: Maximum assistance Feeding assistance: Independent Dressing Assistance: Maximum assistance     Functional Limitations Info  Sight, Hearing, Speech Sight Info: Impaired Hearing Info: Adequate Speech Info: Adequate    SPECIAL CARE FACTORS FREQUENCY  PT (By licensed PT), OT (By licensed OT)     PT Frequency: 5x/week OT Frequency: 5x/week            Contractures Contractures Info: Not present    Additional Factors Info  Code Status, Allergies, Psychotropic Code Status Info: Full Allergies Info:  Zolpidem Tartrate, Pitavastatin, Ropinirole, Penicillins Psychotropic Info: Celexa         Current Medications (04/03/2018):  This is the current hospital active medication list Current Facility-Administered Medications  Medication Dose Route  Frequency Provider Last Rate Last Dose  . acetaminophen (TYLENOL) tablet 650 mg  650 mg Oral Q6H PRN Nita Sells, MD   650 mg at 04/03/18 1314  . allopurinol (ZYLOPRIM) tablet 100 mg  100 mg Oral Daily Crosley, Debby, MD   100 mg at 04/03/18 0839  . aztreonam (AZACTAM) 1 g in sodium chloride 0.9 % 100 mL IVPB  1 g Intravenous  Q8H Quintella Baton, MD   Stopped at 04/03/18 0701  . citalopram (CELEXA) tablet 30 mg  30 mg Oral Daily Crosley, Debby, MD   30 mg at 04/03/18 0838  . ferrous sulfate tablet 325 mg  325 mg Oral Q breakfast Nita Sells, MD   325 mg at 04/03/18 0838  . furosemide (LASIX) injection 40 mg  40 mg Intravenous BID Quintella Baton, MD   40 mg at 04/03/18 0839  . ipratropium-albuterol (DUONEB) 0.5-2.5 (3) MG/3ML nebulizer solution 3 mL  3 mL Inhalation Q6H PRN Claria Dice, Debby, MD   3 mL at 04/02/18 1610  . MEDLINE mouth rinse  15 mL Mouth Rinse BID Claria Dice, Debby, MD   15 mL at 04/03/18 0841  . nitroGLYCERIN (NITROSTAT) SL tablet 0.4 mg  0.4 mg Sublingual Q5 min PRN Crosley, Debby, MD      . ondansetron (ZOFRAN) tablet 4 mg  4 mg Oral Q6H PRN Crosley, Debby, MD       Or  . ondansetron (ZOFRAN) injection 4 mg  4 mg Intravenous Q6H PRN Crosley, Debby, MD      . oxyCODONE (Oxy IR/ROXICODONE) immediate release tablet 10 mg  10 mg Oral Q6H PRN Quintella Baton, MD   10 mg at 04/03/18 0838  . pantoprazole (PROTONIX) EC tablet 40 mg  40 mg Oral Daily Quintella Baton, MD   40 mg at 04/03/18 0839  . pilocarpine (PILOCAR) 1 % ophthalmic solution 1 drop  1 drop Both Eyes BID Quintella Baton, MD   1 drop at 04/03/18 0839  . polyethylene glycol (MIRALAX / GLYCOLAX) packet 17 g  17 g Oral Daily PRN Quintella Baton, MD   17 g at 04/02/18 0949  . pramipexole (MIRAPEX) tablet 0.25 mg  0.25 mg Oral QHS Crosley, Debby, MD   0.25 mg at 04/02/18 2153  . roflumilast (DALIRESP) tablet 500 mcg  500 mcg Oral Daily Quintella Baton, MD   500 mcg at 04/03/18 0840  . traZODone (DESYREL) tablet 150 mg  150 mg Oral QHS Crosley, Debby, MD   150 mg at 04/02/18 2154  . umeclidinium bromide (INCRUSE ELLIPTA) 62.5 MCG/INH 1 puff  1 puff Inhalation BID Quintella Baton, MD   1 puff at 04/03/18 0902     Discharge Medications: Please see discharge summary for a list of discharge medications.  Relevant Imaging Results:  Relevant Lab  Results:   Additional Information SSN: [id number]  Benard Halsted, LCSWA

## 2018-04-03 NOTE — Progress Notes (Signed)
Page to PT to see if they will come re-eval the patient as family is resistant to rehab due to patient's last experience at [facility].   Spoke with patient's daughter, asked her to look at the situation from the perspective of her overall safety/well being and think of the scenario in which pt was unable to get off of the toilet yesterday without a hoyer lift. Granddaughter states that our toilet is too low, and this wouldn't be a problem at home- however pt's mobility has been at a minimum since admission, so PT evaluation is necessary to determine safest option for pt.

## 2018-04-03 NOTE — Progress Notes (Signed)
Patient and family updated on physical therapy evaluation request prior to discharge.

## 2018-04-03 NOTE — Progress Notes (Signed)
Physical Therapy Treatment Patient Details Name: [name] MRN: [id number] DOB: [birth year] Today's Date: 04/03/2018    History of Present Illness Pt is a 77 y.o. female admitted from [facility] on 04/01/18 with hematuria. Recent admission 3/5 with PE and IVC filter placement; d/c on 4/5 to SNF. PMH includes PVD, OA, HTN, DVT, depression, COPD, CHF, CAD, anxiety, multiple orthopedic sxs.     PT Comments    Pt progressing towards goals however, continues to be limited secondary to fatigue. Pt fatigued after clean up following BM, therefore, mobility limited to standing at EOB. REquiring mod to max A +2 for mobility tasks this session. Had lengthy conversation with pt and with pt's family about mobility concerns and recommendations for SNF. Pt and family agreeable to SNF at this time. Will continue to follow acutely to maximize functional mobility independence and safety.    Follow Up Recommendations  SNF;Supervision/Assistance - 24 hour     Equipment Recommendations  None recommended by PT    Recommendations for Other Services       Precautions / Restrictions Precautions Precautions: Fall Restrictions Weight Bearing Restrictions: No    Mobility  Bed Mobility Overal bed mobility: Needs Assistance Bed Mobility: Supine to Sit;Sit to Supine;Rolling Rolling: Mod assist   Supine to sit: Mod assist;+2 for physical assistance Sit to supine: Mod assist;+2 for physical assistance   General bed mobility comments: Mod A +2 for LE management and trunk assist. Required assist with scooting hips to EOB as well. Mod A to roll from side to side for clean up following BM X2. Rolled multiple times and pt easily fatigued.   Transfers Overall transfer level: Needs assistance Equipment used: Rolling walker (2 wheeled) Transfers: Sit to/from Stand Sit to Stand: Max assist;+2 physical assistance         General transfer comment: Max A +2 for lift assist and steadying. Able to come up to  full standing on first attempt, however, on second attempt pt unable to power through LEs secondary to fatigue to come up to full standing. Pt with episode of incontinence during first stand and required assist for cleanup.   Ambulation/Gait             General Gait Details: Unable to attempt    Stairs             Wheelchair Mobility    Modified Rankin (Stroke Patients Only)       Balance Overall balance assessment: Needs assistance Sitting-balance support: No upper extremity supported;Feet supported Sitting balance-Leahy Scale: Fair     Standing balance support: Bilateral upper extremity supported Standing balance-Leahy Scale: Poor Standing balance comment: Reliant on BUE support and external support.                             Cognition Arousal/Alertness: Awake/alert Behavior During Therapy: WFL for tasks assessed/performed Overall Cognitive Status: Within Functional Limits for tasks assessed                                        Exercises      General Comments General comments (skin integrity, edema, etc.): Pt's family in room throughout session. Had lengthy conversation with pt and family about safety concerns for home. Explained level of assist and need for equipment if pt were to return home. Ultimately pt agreeable to SNF, however,  did not want to go back to prior facility.       Pertinent Vitals/Pain Pain Assessment: Faces Faces Pain Scale: Hurts even more Pain Location: "all over" Pain Descriptors / Indicators: Sore Pain Intervention(s): Limited activity within patient's tolerance;Monitored during session;Repositioned    Home Living Family/patient expects to be discharged to:: Private residence Living Arrangements: Alone Available Help at Discharge: Family;Available 24 hours/day Type of Home: House Home Access: Stairs to enter Entrance Stairs-Rails: Right Home Layout: One level Home Equipment: Walker - 2  wheels;Walker - 4 wheels;Cane - single point;Bedside commode;Shower seat;Tub bench;Wheelchair - Biomedical engineer Comments: From SNF, family will be returning pt home with 24/7 support    Prior Function Level of Independence: Needs assistance          PT Goals (current goals can now be found in the care plan section) Acute Rehab PT Goals Patient Stated Goal: Return home, less pain PT Goal Formulation: With patient/family Time For Goal Achievement: 04/16/18 Potential to Achieve Goals: Good Progress towards PT goals: Progressing toward goals    Frequency    Min 2X/week      PT Plan Current plan remains appropriate    Co-evaluation PT/OT/SLP Co-Evaluation/Treatment: Yes Reason for Co-Treatment: To address functional/ADL transfers;For patient/therapist safety;Complexity of the patient's impairments (multi-system involvement) PT goals addressed during session: Mobility/safety with mobility;Balance;Proper use of DME        AM-PAC PT "6 Clicks" Daily Activity  Outcome Measure  Difficulty turning over in bed (including adjusting bedclothes, sheets and blankets)?: Unable Difficulty moving from lying on back to sitting on the side of the bed? : Unable Difficulty sitting down on and standing up from a chair with arms (e.g., wheelchair, bedside commode, etc,.)?: Unable Help needed moving to and from a bed to chair (including a wheelchair)?: A Lot Help needed walking in hospital room?: Total Help needed climbing 3-5 steps with a railing? : Total 6 Click Score: 7    End of Session Equipment Utilized During Treatment: Gait belt;Oxygen Activity Tolerance: Patient limited by fatigue;Patient limited by pain Patient left: in bed;with call bell/phone within reach;with family/visitor present Nurse Communication: Mobility status PT Visit Diagnosis: Other abnormalities of gait and mobility (R26.89);Muscle weakness (generalized) (M62.81);Unsteadiness on feet (R26.81)      Time: 1400-1508 PT Time Calculation (min) (ACUTE ONLY): 68 min  Charges:  $Therapeutic Activity: 23-37 mins                    G Codes:       [name], PT, DPT  Acute Rehabilitation Services  Pager: [phone]    Rudean Hitt 04/03/2018, 3:35 PM

## 2018-04-03 NOTE — Progress Notes (Addendum)
PT/OT at bedside, family in room.

## 2018-04-04 LAB — RENAL FUNCTION PANEL
Albumin: 2.1 g/dL — ABNORMAL LOW (ref 3.5–5.0)
Anion gap: 10 (ref 5–15)
BUN: 9 mg/dL (ref 6–20)
CO2: 36 mmol/L — ABNORMAL HIGH (ref 22–32)
Calcium: 8.3 mg/dL — ABNORMAL LOW (ref 8.9–10.3)
Chloride: 95 mmol/L — ABNORMAL LOW (ref 101–111)
Creatinine, Ser: 0.73 mg/dL (ref 0.44–1.00)
GFR calc Af Amer: >60 mL/min (ref >60)
GFR calc non Af Amer: >60 mL/min (ref >60)
Glucose, Bld: 97 mg/dL (ref 65–99)
Phosphorus: 3.6 mg/dL (ref 2.5–4.6)
Potassium: 3.1 mmol/L — ABNORMAL LOW (ref 3.5–5.1)
Sodium: 141 mmol/L (ref 135–145)

## 2018-04-04 LAB — CBC WITH DIFFERENTIAL/PLATELET
Basophils Absolute: 0 10*3/uL (ref 0.0–0.1)
Basophils Relative: 0 %
Eosinophils Absolute: 0.1 10*3/uL (ref 0.0–0.7)
Eosinophils Relative: 2 %
HCT: 29 % — ABNORMAL LOW (ref 36.0–46.0)
Hemoglobin: 9.1 g/dL — ABNORMAL LOW (ref 12.0–15.0)
Lymphocytes Relative: 13 %
Lymphs Abs: 1.2 10*3/uL (ref 0.7–4.0)
MCH: 29.5 pg (ref 26.0–34.0)
MCHC: 31.4 g/dL (ref 30.0–36.0)
MCV: 94.2 fL (ref 78.0–100.0)
Monocytes Absolute: 1.2 10*3/uL — ABNORMAL HIGH (ref 0.1–1.0)
Monocytes Relative: 13 %
Neutro Abs: 6.4 10*3/uL (ref 1.7–7.7)
Neutrophils Relative %: 72 %
Platelets: 320 10*3/uL (ref 150–400)
RBC: 3.08 MIL/uL — ABNORMAL LOW (ref 3.87–5.11)
RDW: 16.8 % — ABNORMAL HIGH (ref 11.5–15.5)
WBC: 8.9 10*3/uL (ref 4.0–10.5)

## 2018-04-04 MED ORDER — ACETAMINOPHEN 325 MG PO TABS: 650.0000 mg | ORAL_TABLET | Freq: Four times a day (QID) | ORAL | Status: DC | PRN

## 2018-04-04 MED ORDER — FUROSEMIDE 10 MG/ML IJ SOLN
60.0000 mg | Freq: Two times a day (BID) | INTRAMUSCULAR | Status: DC
  Administered 2018-04-04 – 2018-04-05 (×2): 60 mg via INTRAVENOUS
  Filled 2018-04-04 (×3): qty 6

## 2018-04-04 MED ORDER — POTASSIUM CHLORIDE CRYS ER 20 MEQ PO TBCR
40.0000 meq | EXTENDED_RELEASE_TABLET | Freq: Every day | ORAL | Status: DC
  Administered 2018-04-04 – 2018-04-06 (×3): 40 meq via ORAL
  Filled 2018-04-04 (×3): qty 2

## 2018-04-04 MED ORDER — OXYCODONE HCL 5 MG PO TABS
10.0000 mg | ORAL_TABLET | ORAL | Status: DC | PRN
  Administered 2018-04-04 – 2018-04-06 (×9): 10 mg via ORAL
  Filled 2018-04-04 (×9): qty 2

## 2018-04-04 MED ORDER — ASPIRIN 81 MG PO CHEW
81.0000 mg | CHEWABLE_TABLET | Freq: Every day | ORAL | Status: DC
  Administered 2018-04-04 – 2018-04-06 (×3): 81 mg via ORAL
  Filled 2018-04-04 (×3): qty 1

## 2018-04-04 MED ORDER — OXYCODONE HCL 5 MG PO TABS: 10.0000 mg | ORAL_TABLET | ORAL | Status: DC | PRN

## 2018-04-04 NOTE — Progress Notes (Signed)
Hospitalist progress note   [name]  KXF:[id number] DOB: Sep 22, 1941 DOA: 04/01/2018 PCP: [name], MD  Specialists:     Brief Narrative:  80 ?, CABG 2004 DES L main, recurrent DVT on chronic Coumadin, HTN, COPD, carotid stenosis, upper GI bleed gastric erosions-colonoscopy pan diverticulitis, asymptomatic cholelithiasis, systolic CHF EF 69% on chronic home oxygen Recent prolonged hospitalization 3/10-03/26/18 with right-sided ischemic colitis acute hypoxic failure with IVC filter placed 03/14/2018-at that admission needed pressor factors briefly secondary to shock-was also found to have a new large anterior abdominal wall hematoma 16.9 X 11.9 X 12.4  Comes back into the hospital with blood in urine X1 week started out light became darker she still takes a baby aspirin is no longer on blood thinners and was somewhat lightheaded and dizzy  Assessment & Plan:   Hematuria-I discussed patient's case with Dr. Junious Silk who feels at this time there is no need for urological involvement as an inpatient and with stable hemoglobin in the 8 range would ask for 1-2-week follow-up in the office okay to use aspirin or Plavix from perspective of urologist re: cystoscopy  vascular surgeon recommends that patient be on some type of anticoagulation going forward Given her issues with hematoma would prefer to repeat CT scan and may be 1-2 weeks and then decide on Plavix addition back to her anticoagulation  Anemia of hematoma and prior blood loss-probably secondary to hematoma which is about the same size based on CT scan done in the ED on 4/11-does not meet transfusion-hemoglobin relatively stable in the 8 range   Pain control with oxycodone increased to 10 mg every 4 as needed 4/14 Add K pad  SMA thrombosis, DVT-patient had life-threatening abdominal hematoma needed pressors therefore not a good candidate for DOAC at this time   Has IVC filter.    ?  Pyelonephritis-continue aztreonam for now may  de-escalate to ceftriaxone in 24 hours  Hypothyroidism not currently on meds  OSA on CPAP-to use at night chronic respiratory failure on oxygen which is to be continued and we will try and wean as tolerated  Bipolar continue Celexa 30 daily, continue trazodone 50 at bedtime for sleep  Reflux continue Protonix 40 daily  Anasarca secondary to hypoproteinemia and may be anemia WithChronic heart failure-continue Lasix and increased from 40-->60 IV twice daily 4/14  DVT prophylaxis: Resume aspirin 81 mg and SCDs Code Status:   Full   family Communication:   Discussed with daughter disposition Plan: Inpatient   Consultants:   None currently discussed with Dr. Junious Silk of urology  Discussed with vascular surgeon as well Dr. Donzetta Matters  Procedures:   None  Antimicrobials:   Aztreonam 4/11  Subjective:  Fair but in some amount of pain in her abdomen and pelvis she is still very swollen but it seems to be improved as per daughter No chest pain No vomiting Tolerating diet Vomiting blood No clots in her urine  Objective: Vitals:   04/03/18 1935 04/04/18 0539 04/04/18 0729 04/04/18 1138  BP: (!) 117/58 (!) 117/47  (!) 105/47  Pulse: 91 90 91 89  Resp: 18 18 16    Temp: 98.3 F (36.8 C) 98.4 F (36.9 C)  98.3 F (36.8 C)  TempSrc: Oral Oral  Oral  SpO2: 98% 95% 95% 98%  Weight:  90.1 kg (198 lb 11.2 oz)    Height:        Intake/Output Summary (Last 24 hours) at 04/04/2018 1437 Last data filed at 04/04/2018 1201 Gross per 24  hour  Intake 800 ml  Output 1550 ml  Net -750 ml   Filed Weights   04/02/18 0127 04/03/18 0450 04/04/18 0539  Weight: 93.1 kg (205 lb 4 oz) 92.9 kg (204 lb 12.8 oz) 90.1 kg (198 lb 11.2 oz)    Examination:  Alert pleasant anxious On oxygen 4 L S1-S2 no murmur rub or gallop, normal sinus rhythm on monitors with some PVCs Chest is clinically clear Abdomen slightly tender lower quadrant where presence of hematoma was there is no rebound no guarding  however 1+ to 2+ edema   Data Reviewed: I have personally reviewed following labs and imaging studies  CBC: Recent Labs  Lab 04/01/18 1541 04/02/18 0303 04/02/18 0929 04/03/18 0703 04/04/18 0330  WBC 7.0 7.2  --  7.4 8.9  NEUTROABS 4.8  --   --  5.2 6.4  HGB 8.3* 8.9*  8.8* 8.6* 8.6* 9.1*  HCT 27.2* 29.3*  29.3* 28.5* 28.7* 29.0*  MCV 94.1 93.9  --  93.5 94.2  PLT 284 313  --  321 175   Basic Metabolic Panel: Recent Labs  Lab 04/01/18 1541 04/02/18 0303 04/04/18 0330  NA 139 141 141  K 3.7 3.3* 3.1*  CL 95* 95* 95*  CO2 36* 36* 36*  GLUCOSE 99 101* 97  BUN 11 8 9   CREATININE 0.81 0.77 0.73  CALCIUM 8.0* 8.6* 8.3*  MG 1.8  --   --   PHOS  --   --  3.6   GFR: Estimated Creatinine Clearance: 69.2 mL/min (by C-G formula based on SCr of 0.73 mg/dL). Liver Function Tests: Recent Labs  Lab 04/01/18 1541 04/04/18 0330  AST 23  --   ALT 18  --   ALKPHOS 64  --   BILITOT 0.6  --   PROT 5.0*  --   ALBUMIN 2.2* 2.1*   Recent Labs  Lab 04/01/18 1541  LIPASE 19   No results for input(s): AMMONIA in the last 168 hours. Coagulation Profile: Recent Labs  Lab 04/01/18 1541  INR 1.12   Cardiac Enzymes:  Radiology Studies: Reviewed images personally in health database   Scheduled Meds: . allopurinol  100 mg Oral Daily  . aspirin  81 mg Oral Daily  . citalopram  30 mg Oral Daily  . ferrous sulfate  325 mg Oral Q breakfast  . furosemide  60 mg Intravenous BID  . mouth rinse  15 mL Mouth Rinse BID  . pantoprazole  40 mg Oral Daily  . pilocarpine  1 drop Both Eyes BID  . potassium chloride  40 mEq Oral Daily  . pramipexole  0.25 mg Oral QHS  . roflumilast  500 mcg Oral Daily  . traZODone  150 mg Oral QHS  . umeclidinium bromide  1 puff Inhalation BID   Continuous Infusions:    LOS: 3 days    Time spent: [place], MD [facility] ([facility]  If 7PM-7AM, please contact night-coverage [email or web address] Password [facility] 04/04/2018, 2:37  PM

## 2018-04-04 NOTE — Progress Notes (Signed)
Patient resting comfortably during shift report. Denies complaints.  

## 2018-04-04 NOTE — Progress Notes (Signed)
Pt refusing CPAP at this time.

## 2018-04-04 NOTE — Progress Notes (Signed)
Kpad was requested for patient, per Dr Arlyss Queen order, but there are none available at this time. She is on the wait-list.   Warm compress provided for the mean time.

## 2018-04-05 LAB — RENAL FUNCTION PANEL
Albumin: 2.1 g/dL — ABNORMAL LOW (ref 3.5–5.0)
Anion gap: 6 (ref 5–15)
BUN: 8 mg/dL (ref 6–20)
CO2: 41 mmol/L — ABNORMAL HIGH (ref 22–32)
Calcium: 8.3 mg/dL — ABNORMAL LOW (ref 8.9–10.3)
Chloride: 95 mmol/L — ABNORMAL LOW (ref 101–111)
Creatinine, Ser: 0.84 mg/dL (ref 0.44–1.00)
GFR calc Af Amer: >60 mL/min (ref >60)
GFR calc non Af Amer: >60 mL/min (ref >60)
Glucose, Bld: 94 mg/dL (ref 65–99)
Phosphorus: 3.8 mg/dL (ref 2.5–4.6)
Potassium: 3.4 mmol/L — ABNORMAL LOW (ref 3.5–5.1)
Sodium: 142 mmol/L (ref 135–145)

## 2018-04-05 LAB — CBC WITH DIFFERENTIAL/PLATELET
Band Neutrophils: 0 %
Basophils Absolute: 0 10*3/uL (ref 0.0–0.1)
Basophils Relative: 0 %
Blasts: 0 %
Eosinophils Absolute: 0 10*3/uL (ref 0.0–0.7)
Eosinophils Relative: 0 %
HCT: 28.5 % — ABNORMAL LOW (ref 36.0–46.0)
Hemoglobin: 8.4 g/dL — ABNORMAL LOW (ref 12.0–15.0)
Lymphocytes Relative: 2 %
Lymphs Abs: 0.2 10*3/uL — ABNORMAL LOW (ref 0.7–4.0)
MCH: 28.4 pg (ref 26.0–34.0)
MCHC: 29.5 g/dL — ABNORMAL LOW (ref 30.0–36.0)
MCV: 96.3 fL (ref 78.0–100.0)
Metamyelocytes Relative: 0 %
Monocytes Absolute: 0.9 10*3/uL (ref 0.1–1.0)
Monocytes Relative: 10 %
Myelocytes: 0 %
Neutro Abs: 7.5 10*3/uL (ref 1.7–7.7)
Neutrophils Relative %: 88 %
Other: 0 %
Platelets: 326 10*3/uL (ref 150–400)
Promyelocytes Relative: 0 %
RBC: 2.96 MIL/uL — ABNORMAL LOW (ref 3.87–5.11)
RDW: 16.9 % — ABNORMAL HIGH (ref 11.5–15.5)
WBC: 8.6 10*3/uL (ref 4.0–10.5)
nRBC: 0 /100 WBC

## 2018-04-05 LAB — URINE CULTURE: Culture: >=100000 — AB

## 2018-04-05 MED ORDER — FUROSEMIDE 10 MG/ML IJ SOLN
40.0000 mg | Freq: Two times a day (BID) | INTRAMUSCULAR | Status: DC
  Administered 2018-04-05 – 2018-04-06 (×2): 40 mg via INTRAVENOUS
  Filled 2018-04-05 (×2): qty 4

## 2018-04-05 MED ORDER — FUROSEMIDE 20 MG PO TABS: 60.0000 mg | ORAL_TABLET | Freq: Two times a day (BID) | ORAL | Status: DC

## 2018-04-05 MED ORDER — OXYCODONE HCL 10 MG PO TABS: 10.0000 mg | ORAL_TABLET | Freq: Four times a day (QID) | ORAL | 0 refills | Status: DC | PRN

## 2018-04-05 MED ORDER — POTASSIUM CHLORIDE CRYS ER 20 MEQ PO TBCR: 40.0000 meq | EXTENDED_RELEASE_TABLET | Freq: Every day | ORAL | Status: DC

## 2018-04-05 NOTE — Progress Notes (Signed)
Physical Therapy Treatment Patient Details Name: [name] MRN: [id number] DOB: 12/22/1941 Today's Date: 04/05/2018    History of Present Illness Pt is a 77 y.o. female admitted from [facility] SNF on 04/01/18 with hematuria. Recent admission 3/5 with PE and IVC filter placement; d/c on 4/5 to SNF. PMH includes PVD, OA, HTN, DVT, depression, COPD, CHF, CAD, anxiety, multiple orthopedic sxs.     PT Comments    Patient received in bed, very pleasant and willing to work with PT today. She continues to require ModA for rolling as well as ModAx2 for functional bed mobility; able to maintain midline sitting at EOB with S-min guard today as well. Patient and bed was completely soiled, so performed 4 sit to stands with MaxAx2 while Nurse Tech assisted in skin care/cleaning as well as assisting in replacing soiled linens on bed. SpO2 drop to 85-86% today, session highly limited by fatigue. She was left in bed with all needs met, bed placed in chair position this afternoon.     Follow Up Recommendations  SNF;Supervision/Assistance - 24 hour     Equipment Recommendations  None recommended by PT    Recommendations for Other Services       Precautions / Restrictions Precautions Precautions: Fall Precaution Comments: pt denies h/o falls in past 1 year, watch SpO2  Restrictions Weight Bearing Restrictions: No    Mobility  Bed Mobility Overal bed mobility: Needs Assistance Bed Mobility: Supine to Sit;Sit to Supine Rolling: Mod assist   Supine to sit: Mod assist;+2 for physical assistance Sit to supine: Mod assist;+2 for physical assistance   General bed mobility comments: continues to require ModAx2 for functional bed mobility, scooting hips forward and general sequencing, very easily fatigued   Transfers Overall transfer level: Needs assistance Equipment used: Rolling walker (2 wheeled) Transfers: Sit to/from Stand Sit to Stand: Max assist;+2 physical assistance         General  transfer comment: performed multiple sit to stands (x4) with maxA x2; nurse tech present and assisted in cleaning patient/clearing soiled linens from bed. SpO2 sat drop to 85-86% with activity.   Ambulation/Gait             General Gait Details: unable today    Stairs             Wheelchair Mobility    Modified Rankin (Stroke Patients Only)       Balance Overall balance assessment: Needs assistance Sitting-balance support: No upper extremity supported;Feet supported Sitting balance-Leahy Scale: Fair     Standing balance support: Bilateral upper extremity supported Standing balance-Leahy Scale: Poor Standing balance comment: Reliant on BUE support and external support.                             Cognition Arousal/Alertness: Awake/alert Behavior During Therapy: WFL for tasks assessed/performed Overall Cognitive Status: Within Functional Limits for tasks assessed Area of Impairment: Problem solving                             Problem Solving: Difficulty sequencing;Requires verbal cues        Exercises      General Comments        Pertinent Vitals/Pain Pain Assessment: 0-10 Pain Score: 5  Pain Location: "all over" Pain Descriptors / Indicators: Aching;Sore;Discomfort Pain Intervention(s): Limited activity within patient's tolerance;Monitored during session;Repositioned    Home Living  Prior Function            PT Goals (current goals can now be found in the care plan section) Acute Rehab PT Goals Patient Stated Goal: Return home, less pain PT Goal Formulation: With patient/family Time For Goal Achievement: 04/16/18 Potential to Achieve Goals: Good Progress towards PT goals: Progressing toward goals    Frequency    Min 2X/week      PT Plan Current plan remains appropriate    Co-evaluation              AM-PAC PT "6 Clicks" Daily Activity  Outcome Measure  Difficulty turning  over in bed (including adjusting bedclothes, sheets and blankets)?: Unable Difficulty moving from lying on back to sitting on the side of the bed? : Unable Difficulty sitting down on and standing up from a chair with arms (e.g., wheelchair, bedside commode, etc,.)?: Unable Help needed moving to and from a bed to chair (including a wheelchair)?: A Lot Help needed walking in hospital room?: Total Help needed climbing 3-5 steps with a railing? : Total 6 Click Score: 7    End of Session Equipment Utilized During Treatment: Gait belt;Oxygen Activity Tolerance: Patient limited by fatigue Patient left: in bed;with call bell/phone within reach;Other (comment)(bed in chair position )   PT Visit Diagnosis: Other abnormalities of gait and mobility (R26.89);Muscle weakness (generalized) (M62.81);Unsteadiness on feet (R26.81)     Time: 4171-2787 PT Time Calculation (min) (ACUTE ONLY): 30 min  Charges:  $Therapeutic Activity: 23-37 mins                    G Codes:       Deniece Ree PT, DPT, CBIS  Supplemental Physical Therapist [place]   Pager [phone]

## 2018-04-05 NOTE — Progress Notes (Addendum)
Notified by [facility] Medicare Appeals that patient has filed an Production designer, theatre/television/film for her discharge; awaiting for determination of her [place] [phone]  2:27 pm - Received message the patient lost the Hospital Appeal and per [facility] patient needs to be discharged by tomorrow at 12 pm; message sent to SW; B Tamera Punt RN,MHA,BSN

## 2018-04-05 NOTE — Clinical Social Work Note (Addendum)
[facility] will print off referral and start insurance authorization.  Dayton Scrape, CSW [phone]  1:36 pm CSW provided update to patient and her daughter.  Dayton Scrape, [place]  2:15 pm [employer] requesting updated therapy notes. CSW spoke with PT. They plan to work with her at 2:30 pm. Will send note to SNF once available.  Dayton Scrape, [place]

## 2018-04-05 NOTE — Clinical Social Work Note (Signed)
Clinical Social Worker continuing to follow patient and family for support and discharge planning needs.  Per RNCM, patient must be discharged by 12:00 due to the loss of discharge appeal.  CSW has submitted updated therapy notes to [facility] and left message for admissions coordinator to forward to [facility].  CSW remains available for support and to facilitate patient discharge needs.  Barbette Or, LCSW ([place]) [phone]

## 2018-04-05 NOTE — Discharge Summary (Signed)
Physician Discharge Summary  FONTAINE KOSSMAN EGB:[id number] DOB: 11/16/1941 DOA: 04/01/2018  PCP: [name], MD  Admit date: 04/01/2018 Discharge date: 04/05/2018  Time spent: 55 minutes  Recommendations for Outpatient Follow-up:  1. Needs CBC as well as renal panel in 1 week 2. Needs imaging of anterior abdominal wall probably in 2-3 weeks for abdominal wall hematoma to denote resolution versus not 3. Eventually will need vascular appointment with regards to aspirin/Plavix as has an SMA stent and should be fully anticoagulated with regards to this going forward 4. Dr. Junious Silk of urology aware of patient-we will be setting up 1 week follow-up for cystoscopy secondary to hematuria 5. Overall may require goals of care if continues to decline 6. Med changes include-Lasix now 60 mg twice daily, prednisone 40 mg daily, limited Rx oxycodone 10 mg every 6 as needed prescribed, no Plavix only aspirin 81 for now  Discharge Diagnoses:  Principal Problem:   Hematuria Active Problems:   Hypothyroidism   Essential hypertension   COPD (chronic obstructive pulmonary disease) (HCC)   CAD (coronary artery disease)   Dyslipidemia   Pulmonary embolism (HCC)   Acute lower UTI   Hematoma of abdominal wall, subsequent encounter   Discharge Condition: Fair  Diet recommendation: Heart healthy low-salt  Filed Weights   04/03/18 0450 04/04/18 0539 04/05/18 0643  Weight: 92.9 kg (204 lb 12.8 oz) 90.1 kg (198 lb 11.2 oz) 90.6 kg (199 lb 11.8 oz)    History of present illness:  Hematuria-I discussed patient's case with Dr. Junious Silk who feels at this time there is no need for urological involvement as an inpatient and with stable hemoglobin in the 8 range would ask for 1-2-week follow-up in the office okay to use aspirin or Plavix from perspective of urologist re: cystoscopy  vascular surgeon recommends that patient be on some type of anticoagulation going forward Given her issues with hematoma would  prefer to repeat CT scan and may be 1-2 weeks and then decide on Plavix addition back to her anticoagulation-will need discussion once cystoscopy is done regarding the same  Anemia of hematoma and prior blood loss-probably secondary to hematoma which is about the same size based on CT scan done in the ED on 4/11-does not meet transfusion-hemoglobin relatively stable in the 8 range   Pain control with oxycodone increased to 10 mg every 4 as needed 4/14 Add K pad  SMA thrombosis, DVT-patient had life-threatening abdominal hematoma needed pressors therefore not a good candidate for DOAC at this time Has IVC filter.   Lower urinary tract infection-urine grew Enterococcus faecalis and was treated with 3 days of aztreonam ending 4/13 and had no further symptoms of UTI  Hypothyroidism not currently on meds  OSA on CPAP-to use at night chronic respiratory failure on oxygen which is to be continued and we will try and wean as tolerated  Bipolar continue Celexa 30 daily, continue trazodone 50 at bedtime for sleep  Reflux continue Protonix 40 daily  Anasarca secondary to hypoproteinemia and may be anemia With chronic heart failure-continue Lasix and increased from 40-->60 IV twice daily 4/14 Discharge was placed on Lasix 60 twice daily and will need renal panel in about 3 days from discharge along with magnesium-suspect swelling is multifactorial as above   Procedures:  Multiple  Consultations:  Urology  Vascular surgery   Discharge Exam: Vitals:   04/05/18 0700 04/05/18 0941  BP:  (!) 107/36  Pulse:  91  Resp:    Temp:  SpO2: 98% 99%    Awake alert oriented no distress EOMI NCAT on oxygen Chest clear without added sound Abdomen soft but tender pain is controlled however Neurologically intact but weak Anasarca but lower extremities are now less swollen She does have some MASD in intertriginous areas  Discharge Instructions   Discharge Instructions    Diet - low  sodium heart healthy   Complete by:  As directed    Diet - low sodium heart healthy   Complete by:  As directed    Increase activity slowly   Complete by:  As directed    Increase activity slowly   Complete by:  As directed      Allergies as of 04/05/2018      Reactions   Zolpidem Tartrate Anxiety   CONFUSION    Pitavastatin Other (See Comments)   Not sure   Ropinirole Nausea Only   Makes legs jump   Penicillins Rash   Tolerated cephalosporins in past      Medication List    STOP taking these medications   NON FORMULARY   pilocarpine 1 % ophthalmic solution Commonly known as:  PILOCAR     TAKE these medications   allopurinol 100 MG tablet Commonly known as:  ZYLOPRIM Take 100 mg by mouth daily.   aspirin EC 81 MG tablet Take 81 mg by mouth daily.   citalopram 20 MG tablet Commonly known as:  CELEXA Take 30 mg by mouth daily.   [employer] D3 1000 units capsule Generic drug:  Cholecalciferol Take 1,000 Units by mouth daily.   feeding supplement (ENSURE ENLIVE) Liqd Take 237 mLs by mouth 3 (three) times daily between meals.   ferrous sulfate 325 (65 FE) MG tablet Take 325 mg by mouth daily with breakfast.   furosemide 40 MG tablet Commonly known as:  LASIX Take 1 tablet (40 mg total) by mouth 2 (two) times daily.   INCRUSE ELLIPTA 62.5 MCG/INH Aepb Generic drug:  umeclidinium bromide Inhale 1 puff into the lungs 2 (two) times daily.   Ipratropium-Albuterol 20-100 MCG/ACT Aers respimat Commonly known as:  COMBIVENT Inhale 1 puff into the lungs every 6 (six) hours as needed for wheezing.   magnesium oxide 400 (241.3 Mg) MG tablet Commonly known as:  MAG-OX Take 1 tablet (400 mg total) by mouth 2 (two) times daily.   nitroGLYCERIN 0.4 MG SL tablet Commonly known as:  NITROSTAT Place 1 tablet (0.4 mg total) under the tongue every 5 (five) minutes as needed for chest pain. For chest pain   omeprazole 40 MG capsule Commonly known as:  PRILOSEC Take 40 mg by  mouth 2 (two) times daily.   Oxycodone HCl 10 MG Tabs Take 1 tablet (10 mg total) by mouth every 6 (six) hours as needed. What changed:  when to take this   potassium chloride SA 20 MEQ tablet Commonly known as:  K-DUR,KLOR-CON Take 20 mEq by mouth daily.   pramipexole 0.25 MG tablet Commonly known as:  MIRAPEX Take 0.25 mg by mouth at bedtime.   roflumilast 500 MCG Tabs tablet Commonly known as:  DALIRESP Take 500 mcg by mouth daily.   traZODone 50 MG tablet Commonly known as:  DESYREL Take 150 mg by mouth at bedtime.      Allergies  Allergen Reactions  . Zolpidem Tartrate Anxiety    CONFUSION   . Pitavastatin Other (See Comments)    Not sure  . Ropinirole Nausea Only    Makes legs jump  . Penicillins  Rash    Tolerated cephalosporins in past   Follow-up Information    [name], MD In 1 week.   Specialty:  Internal Medicine Contact information: [place] [address] [phone]            The results of significant diagnostics from this hospitalization (including imaging, microbiology, ancillary and laboratory) are listed below for reference.    Significant Diagnostic Studies: Ct Abdomen Pelvis Wo Contrast  Addendum Date: 03/14/2018   ADDENDUM REPORT: 03/14/2018 04:14 ADDENDUM: These results were called by telephone at the time of interpretation on 03/14/2018 at 4:04 am to [facility] , who verbally acknowledged these results. Electronically Signed   By: Ulyses Jarred M.D.   On: 03/14/2018 04:14   Result Date: 03/14/2018 CLINICAL DATA:  Abdominal pain and fever EXAM: CT ABDOMEN AND PELVIS WITHOUT CONTRAST TECHNIQUE: Multidetector CT imaging of the abdomen and pelvis was performed following the standard protocol without IV contrast. COMPARISON:  CT abdomen pelvis 03/09/2018 FINDINGS: Examination is degraded by streak artifact from external patient devices and from the arms down positioning of the patient, creating multiple streak  artifacts and decreasing sensitivity and specificity of this scan. Lower chest: Right pleural effusion and associated atelectasis. Hepatobiliary: Right upper quadrant ascites. Nodular appearance of the liver. No focal liver lesion. Status post cholecystectomy. Pancreas: Atrophic appearance of the pancreas. Spleen: Normal. Adrenals/Urinary Tract: --Adrenal glands: Normal. --Right kidney/ureter: No hydronephrosis, perinephric stranding or nephrolithiasis. No obstructing ureteral stones. --Left kidney/ureter: No hydronephrosis, perinephric stranding or nephrolithiasis. No obstructing ureteral stones. --Urinary bladder: Normal appearance for the degree of distention. Stomach/Bowel: --Stomach/Duodenum: No hiatal hernia or other gastric abnormality. Normal duodenal course. --Small bowel: No dilatation or inflammation. --Colon: Diffuse colonic diverticulosis without acute inflammation. --Appendix: Normal. Vascular/Lymphatic: Atherosclerotic calcification is present within the non-aneurysmal abdominal aorta, without hemodynamically significant stenosis. No abdominal or pelvic lymphadenopathy. Reproductive: Pelvis largely obscured by streak artifact. Musculoskeletal. Right total hip arthroplasty. L1-L3 posterior spinal fusion. Other: Large anterior abdominal wall hematoma measuring up to 16.9 x 11.9 x 12.4 cm. This is new compared to the recent study from 03/09/2018. This exerts mass effect on the intra-abdominal contents. IMPRESSION: 1. New, large anterior abdominal wall hematoma measuring 16.9 x 11.9 x 12.4 cm. 2. Small volume ascites with nodular appearance of the liver concerning for cirrhosis. 3. Medium-sized right pleural effusion with associated atelectasis. 4. Diffuse colonic diverticulosis without acute inflammation. 5.  Aortic Atherosclerosis (ICD10-I70.0). Electronically Signed: By: Ulyses Jarred M.D. On: 03/14/2018 03:45   Dg Chest 2 View  Result Date: 04/01/2018 CLINICAL DATA:  Chest pain and shortness of  breath EXAM: CHEST - 2 VIEW COMPARISON:  03/18/2018, 03/15/2018, 03/13/2018 FINDINGS: Status post right shoulder replacement. Post sternotomy changes. Calcified breast implants. Small bilateral pleural effusions. Cardiomegaly with vascular congestion and mild diffuse interstitial opacity consistent with edema. Apical scarring. IMPRESSION: 1. Cardiomegaly with vascular congestion and mild interstitial pulmonary edema and small pleural effusions. Electronically Signed   By: Donavan Foil M.D.   On: 04/01/2018 17:08   Ct Angio Chest Pe W And/or Wo Contrast  Result Date: 04/01/2018 CLINICAL DATA:  Increased shortness of breath. Recently admitted for pulmonary embolism. Patient also complains of hematuria. EXAM: CT ANGIOGRAPHY CHEST CT ABDOMEN AND PELVIS WITH CONTRAST TECHNIQUE: Multidetector CT imaging of the chest was performed using the standard protocol during bolus administration of intravenous contrast. Multiplanar CT image reconstructions and MIPs were obtained to evaluate the vascular anatomy. Multidetector CT imaging of the abdomen and  pelvis was performed using the standard protocol during bolus administration of intravenous contrast. CONTRAST:  160mL ISOVUE-370 IOPAMIDOL (ISOVUE-370) INJECTION 76% COMPARISON:  CT abdomen pelvis dated March 14, 2018. CT chest dated February 28, 2018. FINDINGS: CTA CHEST FINDINGS Cardiovascular: Satisfactory opacification of the pulmonary arteries to the segmental level. No evidence of pulmonary embolism. Previously seen pulmonary emboli in the left lower lobe have resolved. Mild left atrial enlargement. No pericardial effusion. Normal caliber thoracic aorta. Coronary, aortic arch, and branch vessel atherosclerotic vascular disease. Mediastinum/Nodes: No enlarged mediastinal, hilar, or axillary lymph nodes. Thyroid gland, trachea, and esophagus demonstrate no significant findings. Lungs/Pleura: Unchanged moderate right and trace left pleural effusions. Right greater than left  lower lobe subsegmental atelectasis. No consolidation or pneumothorax. Unchanged biapical pleuroparenchymal scarring. No suspicious pulmonary nodule. Musculoskeletal: Calcified bilateral breast implants. No acute or significant osseous findings. Review of the MIP images confirms the above findings. CT ABDOMEN and PELVIS FINDINGS Hepatobiliary: No focal liver abnormality is seen. Status post cholecystectomy. No biliary dilatation. Pancreas: Severely atrophic. No surrounding inflammatory changes or ductal dilatation. Spleen: Normal in size without focal abnormality. Adrenals/Urinary Tract: The adrenal glands are unremarkable. Small bilateral renal cysts are unchanged. No renal or ureteral calculi. No hydronephrosis. The bladder is unremarkable. Stomach/Bowel: Small hiatal hernia. The stomach is otherwise within normal limits. Normal appendix. No bowel wall thickening, distention, or surrounding inflammatory changes. Colonic diverticulosis. Vascular/Lymphatic: Interval placement of an IVC filter. Unchanged diffuse aortic and branch vessel atherosclerotic vascular calcifications. No enlarged abdominal or pelvic lymph nodes. Reproductive: No adnexal mass. Other: No significant interval change in size of the large anterior abdominal wall hematoma also involving the space of Retzius, now measuring 11.0 x 16.7 x 12.6 cm, previously 11.9 x 16.9 x 12.4 cm. No free fluid or pneumoperitoneum. Musculoskeletal: No acute or significant osseous findings. Prior right total hip arthroplasty and lumbar spine posterior decompression and fusion. Review of the MIP images confirms the above findings. IMPRESSION: 1. No evidence of pulmonary embolism. Interval resolution of previously seen left lower lobe pulmonary emboli. 2. Unchanged moderate right and trace left pleural effusions with adjacent bibasilar atelectasis. 3. Relatively unchanged large anterior abdominal wall hematoma involving the space of Retzius, measuring 11.0 x 16.7 x 12.6  cm. 4.  Aortic atherosclerosis (ICD10-I70.0). Electronically Signed   By: Titus Dubin M.D.   On: 04/01/2018 19:07   Ct Abdomen Pelvis W Contrast  Result Date: 04/01/2018 CLINICAL DATA:  Increased shortness of breath. Recently admitted for pulmonary embolism. Patient also complains of hematuria. EXAM: CT ANGIOGRAPHY CHEST CT ABDOMEN AND PELVIS WITH CONTRAST TECHNIQUE: Multidetector CT imaging of the chest was performed using the standard protocol during bolus administration of intravenous contrast. Multiplanar CT image reconstructions and MIPs were obtained to evaluate the vascular anatomy. Multidetector CT imaging of the abdomen and pelvis was performed using the standard protocol during bolus administration of intravenous contrast. CONTRAST:  134mL ISOVUE-370 IOPAMIDOL (ISOVUE-370) INJECTION 76% COMPARISON:  CT abdomen pelvis dated March 14, 2018. CT chest dated February 28, 2018. FINDINGS: CTA CHEST FINDINGS Cardiovascular: Satisfactory opacification of the pulmonary arteries to the segmental level. No evidence of pulmonary embolism. Previously seen pulmonary emboli in the left lower lobe have resolved. Mild left atrial enlargement. No pericardial effusion. Normal caliber thoracic aorta. Coronary, aortic arch, and branch vessel atherosclerotic vascular disease. Mediastinum/Nodes: No enlarged mediastinal, hilar, or axillary lymph nodes. Thyroid gland, trachea, and esophagus demonstrate no significant findings. Lungs/Pleura: Unchanged moderate right and trace left pleural effusions. Right greater than left lower lobe  subsegmental atelectasis. No consolidation or pneumothorax. Unchanged biapical pleuroparenchymal scarring. No suspicious pulmonary nodule. Musculoskeletal: Calcified bilateral breast implants. No acute or significant osseous findings. Review of the MIP images confirms the above findings. CT ABDOMEN and PELVIS FINDINGS Hepatobiliary: No focal liver abnormality is seen. Status post cholecystectomy.  No biliary dilatation. Pancreas: Severely atrophic. No surrounding inflammatory changes or ductal dilatation. Spleen: Normal in size without focal abnormality. Adrenals/Urinary Tract: The adrenal glands are unremarkable. Small bilateral renal cysts are unchanged. No renal or ureteral calculi. No hydronephrosis. The bladder is unremarkable. Stomach/Bowel: Small hiatal hernia. The stomach is otherwise within normal limits. Normal appendix. No bowel wall thickening, distention, or surrounding inflammatory changes. Colonic diverticulosis. Vascular/Lymphatic: Interval placement of an IVC filter. Unchanged diffuse aortic and branch vessel atherosclerotic vascular calcifications. No enlarged abdominal or pelvic lymph nodes. Reproductive: No adnexal mass. Other: No significant interval change in size of the large anterior abdominal wall hematoma also involving the space of Retzius, now measuring 11.0 x 16.7 x 12.6 cm, previously 11.9 x 16.9 x 12.4 cm. No free fluid or pneumoperitoneum. Musculoskeletal: No acute or significant osseous findings. Prior right total hip arthroplasty and lumbar spine posterior decompression and fusion. Review of the MIP images confirms the above findings. IMPRESSION: 1. No evidence of pulmonary embolism. Interval resolution of previously seen left lower lobe pulmonary emboli. 2. Unchanged moderate right and trace left pleural effusions with adjacent bibasilar atelectasis. 3. Relatively unchanged large anterior abdominal wall hematoma involving the space of Retzius, measuring 11.0 x 16.7 x 12.6 cm. 4.  Aortic atherosclerosis (ICD10-I70.0). Electronically Signed   By: Titus Dubin M.D.   On: 04/01/2018 19:07   Ir Ivc Filter Plmt / S&i /img Guid/mod Sed  Result Date: 03/14/2018 INDICATION: 77 year old with history of pulmonary embolism and left lower extremity DVT. Patient now has a large abdominal wall hematoma and no longer candidate for anticoagulation. EXAM: IVC FILTER PLACEMENT; IVC  VENOGRAM; ULTRASOUND FOR VASCULAR ACCESS Physician: Stephan Minister. Anselm Pancoast, MD MEDICATIONS: None. ANESTHESIA/SEDATION: Fentanyl 25 mcg IV; Versed 0.5 mg IV Moderate Sedation Time:  22 minutes The patient was continuously monitored during the procedure by the interventional radiology nurse under my direct supervision. CONTRAST:  Carbon dioxide FLUOROSCOPY TIME:  Fluoroscopy Time: 7 minutes 6 seconds (327 mGy). COMPLICATIONS: None immediate. PROCEDURE: Informed consent was obtained for an IVC venogram and filter placement. Ultrasound demonstrated a patent right internal jugular vein. Ultrasound images were obtained for documentation. The right side of the neck was prepped and draped in a sterile fashion. Maximal barrier sterile technique was utilized including caps, mask, sterile gowns, sterile gloves, sterile drape, hand hygiene and skin antiseptic. The skin was anesthetized with 1% lidocaine. A 21 gauge needle was directed into the vein with ultrasound guidance and a micropuncture dilator set was placed. A wire was advanced into the IVC. The filter sheath was advanced over the wire into the IVC. An IVC venogram was performed with carbon dioxide. Limited evaluation of the IVC due to carbon dioxide and the lumbar surgical hardware. Therefore, a MPA catheter was used to cannulate bilateral renal veins. Renal venography was also performed with carbon dioxide bilaterally. The location of the renal veins were confidently identified. Fluoroscopic images were obtained for documentation. A Bard Denali filter was deployed in the IVC below the lowest renal vein. Follow-up images were obtained to confirm adequate deployment of the filter. Vascular sheath was removed with manual compression. FINDINGS: IVC was patent. Bilateral renal veins were identified. The filter was deployed below the lowest  renal vein. IMPRESSION: Successful placement of a retrievable IVC filter. PLAN: This IVC filter is potentially retrievable. The patient will be  assessed for filter retrieval by Interventional Radiology in approximately 8-12 weeks. Further recommendations regarding filter retrieval, continued surveillance or declaration of device permanence, will be made at that time. Electronically Signed   By: Markus Daft M.D.   On: 03/14/2018 13:03   US Renal  Result Date: 03/15/2018 CLINICAL DATA:  Acute kidney injury. EXAM: RENAL / URINARY TRACT ULTRASOUND COMPLETE COMPARISON:  CT, 03/14/2018. FINDINGS: Right Kidney: Length: 8.6 cm. Diffuse cortical thinning. Increased renal parenchymal echogenicity. There is a 2.6 x 2.0 x 2.3 cm cyst arising from the lower pole. No other masses, no stones and no hydronephrosis. Left Kidney: Length: 9.2 cm. Mild diffuse cortical thinning. Borderline increased renal parenchymal echogenicity. No mass, stone or hydronephrosis. Lower pole partly obscured by overlying bowel gas. Bladder: Decompressed with a Foley catheter.  Not well assessed. Patient also has ascites and a right pleural effusion. IMPRESSION: 1. Findings consistent with medical renal disease with renal cortical thinning, greater on the right, and increased renal parenchymal echogenicity on the right and borderline increased renal parenchymal echogenicity on the left. 2. No acute findings.  No hydronephrosis. 3. 2.6 cm right lower pole renal cyst. Electronically Signed   By: Lajean Manes M.D.   On: 03/15/2018 15:45   Dg Chest Port 1 View  Result Date: 03/18/2018 CLINICAL DATA:  Dyspnea, bilateral leg and arm swelling, abdominal pain and nausea for the past 3-4 days. History of COPD, CHF, and previous CABG. EXAM: PORTABLE CHEST 1 VIEW COMPARISON:  Portable chest x-ray of March 15, 2018 FINDINGS: The lungs are well-expanded. There is no focal infiltrate. There is no pleural effusion. The cardiac silhouette is enlarged. The pulmonary vascularity is not engorged. There are post CABG changes. There is calcification in the wall of the thoracic aorta. There are bilateral  breast implants exhibiting capsular calcification. IMPRESSION: COPD. Mild cardiomegaly. Previous CABG. No pulmonary edema, pneumonia, or pleural effusion. Thoracic aortic atherosclerosis. Electronically Signed   By: David  Martinique M.D.   On: 03/18/2018 09:15   Dg Chest Port 1 View  Result Date: 03/15/2018 CLINICAL DATA:  Pleural effusion. EXAM: PORTABLE CHEST 1 VIEW COMPARISON:  Radiograph of March 13, 2018. FINDINGS: The heart size and mediastinal contours are within normal limits. Atherosclerosis of thoracic aorta is noted. Status post coronary artery bypass graft. No pneumothorax is noted. Increased right basilar opacity is noted concerning for worsening atelectasis or infiltrate with associated pleural effusion. Status post right shoulder arthroplasty. IMPRESSION: Increased right basilar atelectasis or infiltrate is noted with possible associated pleural effusion. Aortic Atherosclerosis (ICD10-I70.0). Electronically Signed   By: Marijo Conception, M.D.   On: 03/15/2018 07:45   Dg Chest Port 1 View  Result Date: 03/13/2018 CLINICAL DATA:  77 year old with shortness of breath. EXAM: PORTABLE CHEST 1 VIEW COMPARISON:  Chest CT 02/28/2018 and chest radiograph 11/08/2017 FINDINGS: Bilateral breast implants. No focal airspace disease or pulmonary edema. Right shoulder replacement. Atherosclerotic calcifications at the aortic arch. Prior CABG procedure. Sclerosis and degenerative changes at the left shoulder. Surgical hardware in the lumbar spine. Heart size is within normal limits. IMPRESSION: No acute chest findings. Electronically Signed   By: Markus Daft M.D.   On: 03/13/2018 12:52   Dg Abd 2 Views  Result Date: 03/12/2018 CLINICAL DATA:  Lower abdominal pain beginning this morning post vascular surgery EXAM: ABDOMEN - 2 VIEW COMPARISON:  03/08/2018 FINDINGS: Normal bowel  gas pattern. No bowel dilatation or bowel wall thickening. Gas present in rectum. Bones demineralized with evidence of prior lumbar  fusion and prior RIGHT hip replacement. Scattered degenerative changes of lower thoracic spine. Surgical clips RIGHT upper quadrant question cholecystectomy. Degenerative changes LEFT hip joint. Question vascular calcification versus 8 mm diameter calculus at upper pole LEFT kidney. IMPRESSION: No acute abnormalities. Electronically Signed   By: Lavonia Dana M.D.   On: 03/12/2018 19:28   Dg Abd Portable 1v  Result Date: 03/16/2018 CLINICAL DATA:  Abdominal pain EXAM: PORTABLE ABDOMEN - 1 VIEW COMPARISON:  None. FINDINGS: There is no bowel dilatation or air-fluid level to suggest bowel obstruction. No free air. There is a total hip replacement on the right as well as postoperative change in the lumbar region. There is a filter in the inferior vena cava. There are calcified breast implants bilaterally. IMPRESSION: No demonstrable bowel obstruction or free air. Electronically Signed   By: Lowella Grip III M.D.   On: 03/16/2018 11:37   Dg Abd Portable 1v  Result Date: 03/08/2018 CLINICAL DATA:  Nausea and vomiting EXAM: PORTABLE ABDOMEN - 1 VIEW COMPARISON:  CT 02/28/2018 FINDINGS: Surgical hardware in the upper lumbar spine. Surgical clips in the right upper quadrant. Nonobstructed bowel-gas pattern. Radiopaque material in the bladder. Probable diverticulum on the right side of the bladder. Status post right hip replacement with fixating screw paralleling the cortex of the right inter pelvis. IMPRESSION: 1. Nonobstructed gas pattern 2. Contrast material within the bladder Electronically Signed   By: Donavan Foil M.D.   On: 03/08/2018 22:30   Ct Angio Abd/pel W/ And/or W/o  Result Date: 03/09/2018 CLINICAL DATA:  Upper abdominal pain and nausea after mesenteric stent placement today. EXAM: CTA ABDOMEN AND PELVIS wITHOUT AND WITH CONTRAST TECHNIQUE: Multidetector CT imaging of the abdomen and pelvis was performed using the standard protocol during bolus administration of intravenous contrast. Multiplanar  reconstructed images and MIPs were obtained and reviewed to evaluate the vascular anatomy. CONTRAST:  100 cc Isovue 370 IV COMPARISON:  Abdomen/pelvis CT 02/28/2018 FINDINGS: VASCULAR Aorta: Moderate atherosclerosis. Normal caliber aorta without aneurysm, dissection, vasculitis or significant stenosis. Celiac: Plaque at the origin causes approximately 50% stenosis. No dissection or vasculitis. Distal branches are patent. SMA: Patent proximal SMA stent with resolved origin stenosis. Calcified distally but patent. No evidence of dissection. No evidence of contrast extravasation. Renals: Calcified plaque at the origins. No dissection or vasculitis. IMA: Patent without evidence of aneurysm, dissection, vasculitis or significant stenosis. Inflow: Patent without evidence of aneurysm, dissection, vasculitis or significant stenosis. Proximal Outflow: Bilateral common femoral and visualized portions of the superficial and profunda femoral arteries are patent without evidence of aneurysm, dissection, vasculitis or significant stenosis. No evidence of pseudoaneurysm formation or active extravasation at the puncture site. Veins: Not well assessed on arterial phase imaging. There is presumed mixing of opacified and non-opacified blood in the mesenteric venous confluence. Review of the MIP images confirms the above findings. NON-VASCULAR Lower chest: Linear atelectasis in both lower lobes. Trace right pleural effusion. Coronary artery calcifications. Peripherally calcified left breast implant partially included. Hepatobiliary: Slight capsular nodularity raises concern for cirrhosis. No discrete focal lesion. Postcholecystectomy with grossly unchanged biliary prominence allowing for arterial phase technique. Pancreas: Parenchymal atrophy. No ductal dilatation or inflammation. Spleen: Normal in size without focal abnormality. Adrenals/Urinary Tract: Mild left adrenal thickening. Normal right adrenal gland. Excreted IV contrast in  both kidneys and urinary bladder from angiogram earlier this day. No hydronephrosis or perinephric edema.  Bilateral renal cysts. Stomach/Bowel: Small hiatal hernia. Stomach is nondistended. Presence of intra-abdominal ascites limits bowel evaluation. There is colonic wall thickening involving the ascending colon, hepatic flexure, unlikely proximal transverse colon. No definite small bowel inflammation. Colonic diverticulosis throughout the colon. Lymphatic: No definite adenopathy. No evidence of retroperitoneal hemorrhage. Reproductive: Uterus obscured by right hip arthroplasty. Other: Development of moderate volume abdominopelvic ascites. Fluid may be minimally complex, however does not represent values concerning for hemorrhage. There is mild mesenteric edema. No free air. Small supraumbilical hernia contains fat and small amount of free fluid. Musculoskeletal: Postsurgical change in the lumbar spine, unchanged from prior. Right hip arthroplasty. IMPRESSION: VASCULAR 1. Placement of superior mesenteric artery stent which is widely patent. No evidence of dissection or postprocedural pseudoaneurysm/hematoma. 2. Diffuse aortic and branch atherosclerosis. NON-VASCULAR 1. Interval development of moderate abdominopelvic ascites from prior exam. Fluid is minimally complex but does not represent hemorrhage. 2. Colonic wall thickening involving the cecum, ascending, and proximal transverse colon. This is new since prior CT. Exact etiology is uncertain. Infectious or inflammatory etiologies are considered, colonic wall edema related to reperfusion could have a similar appearance. No increased density to suggest bowel wall hemorrhage. 3. Colonic diverticulosis without specific findings to suggest diverticulitis. 4. Question of nodular hepatic contours raises concern for cirrhosis. Recommend correlation with cirrhosis risk factors. Electronically Signed   By: Jeb Levering M.D.   On: 03/09/2018 03:18   Ir  Paracentesis  Result Date: 03/10/2018 INDICATION: Recent superior mesenteric artery stent placement. New ascites. Request for diagnostic paracentesis. EXAM: ULTRASOUND GUIDED RIGHT LATERAL ABDOMEN PARACENTESIS MEDICATIONS: 2% lidocaine = 10 mL COMPLICATIONS: None immediate. PROCEDURE: Informed written consent was obtained from the patient after a discussion of the risks, benefits and alternatives to treatment. A timeout was performed prior to the initiation of the procedure. Initial ultrasound scanning demonstrates a small amount of ascites within the right lateral abdomen. The right lateral abdomen was prepped and draped in the usual sterile fashion. 1% lidocaine with epinephrine was used for local anesthesia. Following this, a 19 gauge, 7-cm, Yueh catheter was introduced. An ultrasound image was saved for documentation purposes. The paracentesis was performed. The catheter was removed and a dressing was applied. The patient tolerated the procedure well without immediate post procedural complication. FINDINGS: A total of approximately 100 ml of clear red fluid was removed. Samples were sent to the laboratory as requested by the clinical team. IMPRESSION: Successful ultrasound-guided paracentesis yielding 143mL of peritoneal fluid. Read by: Gareth Eagle, PA-C Electronically Signed   By: Lucrezia Europe M.D.   On: 03/10/2018 11:13    Microbiology: Recent Results (from the past 240 hour(s))  Urine culture     Status: Abnormal   Collection Time: 04/01/18  9:52 PM  Result Value Ref Range Status   Specimen Description URINE, CATHETERIZED  Final   Special Requests   Final    NONE Performed at [facility], [address] [place], [place] [address]    Culture (A)  Final    >=100,000 COLONIES/mL ENTEROCOCCUS FAECALIS 80,000 COLONIES/mL STAPHYLOCOCCUS AUREUS    Report Status 04/05/2018 FINAL  Final   Organism ID, Bacteria ENTEROCOCCUS FAECALIS (A)  Final      Susceptibility   Enterococcus faecalis -  MIC*    AMPICILLIN <=2 SENSITIVE Sensitive     LEVOFLOXACIN >=8 RESISTANT Resistant     NITROFURANTOIN <=16 SENSITIVE Sensitive     VANCOMYCIN 1 SENSITIVE Sensitive     * >=100,000 COLONIES/mL ENTEROCOCCUS FAECALIS  Blood  culture (routine x 2)     Status: None (Preliminary result)   Collection Time: 04/02/18 12:09 AM  Result Value Ref Range Status   Specimen Description BLOOD RIGHT ANTECUBITAL  Final   Special Requests   Final    BOTTLES DRAWN AEROBIC AND ANAEROBIC Blood Culture adequate volume   Culture   Final    NO GROWTH 3 DAYS Performed at [facility], [address], [place], [place] [address]    Report Status PENDING  Incomplete  Blood culture (routine x 2)     Status: None (Preliminary result)   Collection Time: 04/02/18 12:14 AM  Result Value Ref Range Status   Specimen Description BLOOD LEFT HAND  Final   Special Requests   Final    BOTTLES DRAWN AEROBIC AND ANAEROBIC Blood Culture adequate volume   Culture   Final    NO GROWTH 3 DAYS Performed at [facility], [place] [address], [place], [place] [address]    Report Status PENDING  Incomplete  MRSA PCR Screening     Status: None   Collection Time: 04/02/18  3:42 AM  Result Value Ref Range Status   MRSA by PCR NEGATIVE NEGATIVE Final    Comment:        The GeneXpert MRSA Assay (FDA approved for NASAL specimens only), is one component of a comprehensive MRSA colonization surveillance program. It is not intended to diagnose MRSA infection nor to guide or monitor treatment for MRSA infections. Performed at [facility], [place] [address], [place],  [address]      Labs: Basic Metabolic Panel: Recent Labs  Lab 04/01/18 1541 04/02/18 0303 04/04/18 0330 04/05/18 0540  NA 139 141 141 142  K 3.7 3.3* 3.1* 3.4*  CL 95* 95* 95* 95*  CO2 36* 36* 36* 41*  GLUCOSE 99 101* 97 94  BUN 11 8 9 8   CREATININE 0.81 0.77 0.73 0.84  CALCIUM 8.0* 8.6* 8.3* 8.3*  MG 1.8  --   --   --   PHOS  --   --   3.6 3.8   Liver Function Tests: Recent Labs  Lab 04/01/18 1541 04/04/18 0330 04/05/18 0540  AST 23  --   --   ALT 18  --   --   ALKPHOS 64  --   --   BILITOT 0.6  --   --   PROT 5.0*  --   --   ALBUMIN 2.2* 2.1* 2.1*   Recent Labs  Lab 04/01/18 1541  LIPASE 19   No results for input(s): AMMONIA in the last 168 hours. CBC: Recent Labs  Lab 04/01/18 1541 04/02/18 0303 04/02/18 0929 04/03/18 0703 04/04/18 0330 04/05/18 0540  WBC 7.0 7.2  --  7.4 8.9 8.6  NEUTROABS 4.8  --   --  5.2 6.4 7.5  HGB 8.3* 8.9*  8.8* 8.6* 8.6* 9.1* 8.4*  HCT 27.2* 29.3*  29.3* 28.5* 28.7* 29.0* 28.5*  MCV 94.1 93.9  --  93.5 94.2 96.3  PLT 284 313  --  321 320 326   Cardiac Enzymes: No results for input(s): CKTOTAL, CKMB, CKMBINDEX, TROPONINI in the last 168 hours. BNP: BNP (last 3 results) Recent Labs    03/13/18 1329 03/18/18 1032 04/01/18 1541  BNP 220.9* 362.6* 194.0*    ProBNP (last 3 results) Recent Labs    08/17/17 1038 09/14/17 0921  PROBNP 612 503    CBG: No results for input(s): GLUCAP in the last 168 hours.  Signed:  Nita Sells MD   [facility] 04/05/2018, 5:09 PM

## 2018-04-06 DIAGNOSIS — J80 Acute respiratory distress syndrome: Secondary | ICD-10-CM | POA: Diagnosis not present

## 2018-04-06 DIAGNOSIS — K551 Chronic vascular disorders of intestine: Secondary | ICD-10-CM | POA: Diagnosis not present

## 2018-04-06 DIAGNOSIS — D649 Anemia, unspecified: Secondary | ICD-10-CM | POA: Diagnosis not present

## 2018-04-06 DIAGNOSIS — R109 Unspecified abdominal pain: Secondary | ICD-10-CM | POA: Diagnosis not present

## 2018-04-06 DIAGNOSIS — M810 Age-related osteoporosis without current pathological fracture: Secondary | ICD-10-CM | POA: Diagnosis not present

## 2018-04-06 DIAGNOSIS — R262 Difficulty in walking, not elsewhere classified: Secondary | ICD-10-CM | POA: Diagnosis not present

## 2018-04-06 DIAGNOSIS — R0902 Hypoxemia: Secondary | ICD-10-CM | POA: Diagnosis not present

## 2018-04-06 DIAGNOSIS — I70209 Unspecified atherosclerosis of native arteries of extremities, unspecified extremity: Secondary | ICD-10-CM | POA: Diagnosis not present

## 2018-04-06 DIAGNOSIS — R310 Gross hematuria: Secondary | ICD-10-CM | POA: Diagnosis not present

## 2018-04-06 DIAGNOSIS — I2510 Atherosclerotic heart disease of native coronary artery without angina pectoris: Secondary | ICD-10-CM | POA: Diagnosis not present

## 2018-04-06 DIAGNOSIS — N390 Urinary tract infection, site not specified: Secondary | ICD-10-CM | POA: Diagnosis not present

## 2018-04-06 DIAGNOSIS — D500 Iron deficiency anemia secondary to blood loss (chronic): Secondary | ICD-10-CM | POA: Diagnosis not present

## 2018-04-06 DIAGNOSIS — E039 Hypothyroidism, unspecified: Secondary | ICD-10-CM | POA: Diagnosis not present

## 2018-04-06 DIAGNOSIS — R319 Hematuria, unspecified: Secondary | ICD-10-CM | POA: Diagnosis not present

## 2018-04-06 DIAGNOSIS — I10 Essential (primary) hypertension: Secondary | ICD-10-CM | POA: Diagnosis not present

## 2018-04-06 DIAGNOSIS — J449 Chronic obstructive pulmonary disease, unspecified: Secondary | ICD-10-CM | POA: Diagnosis not present

## 2018-04-06 NOTE — Clinical Social Work Note (Addendum)
Patient has insurance approval to go to [facility] today. Per RN, MD wants to observe until lunch time and make sure there is no further bleeding before she leaves. CSW notified patient's daughter.  Dayton Scrape, [place]  10:05 am Per MD, no need to wait until lunch time. CSW left message for daughter. Will set up transport when she responds. She can be here in 10 minutes once notified.  Dayton Scrape, [place]

## 2018-04-06 NOTE — Progress Notes (Signed)
Medicare appeal upheld through [facility]. Spoke w Medical Advisor. [id number] issued to patient to notify she would be responsible for payment after today. This completes appeal process. DC anticipated by 12:00 to SNF as facilitated by CSW.

## 2018-04-06 NOTE — Progress Notes (Signed)
Report given to [facility].Transported to [facility] by [facility].Daughter took all patients belongings. All d/c paper works and scripts given to [employer].

## 2018-04-06 NOTE — Consult Note (Signed)
   [facility] CM Inpatient Consult   04/06/2018  [name] 1941-01-17 [id number]   Met with the patient at the bedside regarding [facility] following at the skilled nursing facility.  Patient is in agreement for [facility] follow through for short term rehab in anticipation of returning home. Will have a [facility] social worker to follow and provide resources for home needs. Patient is a [facility] HMO member.  For questions, please contact:  Natividad Brood, RN BSN [facility] Liaison  [phone] business mobile phone Toll free office [phone]

## 2018-04-06 NOTE — Evaluation (Signed)
Clinical/Bedside Swallow Evaluation Patient Details  Name: [name] MRN: [id number] Date of Birth: 1941/12/18  Today's Date: 04/06/2018 Time: SLP Start Time (ACUTE ONLY): 0745 SLP Stop Time (ACUTE ONLY): 0800 SLP Time Calculation (min) (ACUTE ONLY): 15 min  Past Medical History:  Past Medical History:  Diagnosis Date  . Anemia    bld. transfusion post lumbar surgery- 2012  . Anxiety   . Arthralgia    NOS  . Blood transfusion 2011   WITH BACK SURGERY  . CAD (coronary artery disease)    s/p CABG 2004; s/p DES to LM in 2010;  [place] 10/29/11: EF 50-55%, mild elevated filling pressures, no pulmonary HTN, LM 90% ISR, LAD and CFX occluded, S-RI occluded (old), S-OM3 ok and L-LAD ok, native nondominant RCA 95% -  med rx recommended ; Lexiscan Myoview 7/13 at [facility]: demonstrated "normal LV function, anterior attenuation and localized ischemia, inferior, basilar, mid section"  . Carotid artery occlusion    moderate  . Chronic diastolic heart failure (HCC)    Echo 9/10: EF 81-19%, grade 1 diastolic dysfunction  . COPD (chronic obstructive pulmonary disease) ([place])    Emphysema dxed by Dr. Woody Seller in [place] based on PFTs per pt in 2006; placed on albuterol  . Depression    TAKES CELEXA  AND  (OFF- WELBUTRIN)  . DVT of lower extremity (deep venous thrombosis) (HCC)    recurrent. bilateral (2 episodes)  . Dyspnea   . Dysrhythmia    afib with cabg  . Exertional angina (HCC)    Treated with Isosorbide, Ranexa, amlodipine; intolerant to metoprolol  . GERD (gastroesophageal reflux disease)   . HLD (hyperlipidemia)   . Hypertension   . Hyperthyroidism   . Obesity (BMI 30-39.9) 2009   BMI 33  . Osteoarthritis   . Pneumonia   . PVD (peripheral vascular disease) ([place])   . Sleep apnea 2012   USED CPAP THEN  STARTED USING SPIRIVA , Nov. 2013- last evaluation , changed from mask to aparatus that is just for her nose.Marland Kitchen    Past Surgical History:  Past Surgical History:  Procedure  Laterality Date  . ANKLE SURGERY  2004  . [place]   lower; another scheduled, opt. reports 4 back- lumbar, 3 cerv. fusions  for later 2009  . BREAST ENHANCEMENT SURGERY    . CARDIAC CATHETERIZATION  11/2009   Patent LIMA to LAD and patent SVG to OM1. Occluded SVG to ramus and diagonal. Left main: 90% ostial stenosis, LCX 60-70% proximal stenosis  . COLONOSCOPY WITH PROPOFOL N/A 02/17/2017   Procedure: COLONOSCOPY WITH PROPOFOL;  Surgeon: Jerene Bears, MD;  Location: [facility] ENDOSCOPY;  Service: Endoscopy;  Laterality: N/A;  . CORONARY ANGIOPLASTY WITH STENT PLACEMENT  11/2009   Drug eluting stent to left main artery: 4.0 X 12 mm Ion   . CORONARY ARTERY BYPASS GRAFT  2004  . ESOPHAGOGASTRODUODENOSCOPY N/A 02/14/2017   Procedure: ESOPHAGOGASTRODUODENOSCOPY (EGD);  Surgeon: Doran Stabler, MD;  Location[facility] ENDOSCOPY;  Service: Endoscopy;  Laterality: N/A;  . EYE SURGERY  2006   BILATERAL CAT EXT.   Marland Kitchen GIVENS CAPSULE STUDY N/A 02/15/2017   Procedure: GIVENS CAPSULE STUDY;  Surgeon: Doran Stabler, MD;  Location: [facility];  Service: Endoscopy;  Laterality: N/A;  . GROIN MASS OPEN BIOPSY  2004  . IR IVC FILTER PLMT / S&I /IMG GUID/MOD SED  03/14/2018  . IR PARACENTESIS  03/10/2018  . JOINT REPLACEMENT  2012    BILATERAL  KNEES  . JOINT REPLACEMENT  2010    RIGHT HIP REPLACEMENT  . NECK SURGERY  12/08   Due for repeat neck surgery  . PERIPHERAL VASCULAR INTERVENTION Left 03/05/2018   Procedure: PERIPHERAL VASCULAR INTERVENTION;  Surgeon: Elam Dutch, MD;  Location: [facility] CV LAB;  Service: Cardiovascular;  Laterality: Left;  Attempted unsuccess\ful Per Dr. Eden Lathe  . PERIPHERAL VASCULAR INTERVENTION  03/08/2018   Procedure: PERIPHERAL VASCULAR INTERVENTION;  Surgeon: Waynetta Sandy, MD;  Location: [place] CV LAB;  Service: Cardiovascular;;  SMA Stent   . REVERSE SHOULDER ARTHROPLASTY  02/26/2012   Procedure: REVERSE SHOULDER ARTHROPLASTY;  Surgeon: Marin Shutter, MD;  Location: [facility];  Service: Orthopedics;  Laterality: Right;  RIGHT SHOULDER REVERSED ARTHROPLASTY  . SHOULDER ARTHROSCOPY WITH SUBACROMIAL DECOMPRESSION Left 02/10/2013   Procedure: SHOULDER ARTHROSCOPY WITH SUBACROMIAL DECOMPRESSION DISTAL CLAVICLE RESECTION;  Surgeon: Marin Shutter, MD;  Location: [facility];  Service: Orthopedics;  Laterality: Left;  DISTAL CLAVICLE RESECTION  . TOTAL HIP ARTHROPLASTY  2007   Right  . VISCERAL ANGIOGRAPHY N/A 03/05/2018   Procedure: VISCERAL ANGIOGRAPHY;  Surgeon: Elam Dutch, MD;  Location: [place] [facility];  Service: Cardiovascular;  Laterality: N/A;  . VISCERAL ANGIOGRAPHY N/A 03/08/2018   Procedure: VISCERAL ANGIOGRAPHY;  Surgeon: Waynetta Sandy, MD;  Location: [place] [facility] LAB;  Service: Cardiovascular;  Laterality: N/A;   HPI:  This is a 77y/o female resident of [facility] who on the 5th march with a diagnosis of PE. Marland Kitchen She was anticoagulated, did not do well. Anticoagulation stopped and IVC filter placed. Since discharge she noted blood in her urine 1 week ago. It started out light and became darker. She has occasional BOU. She is no longer on blood thinners. She is still taking a baby ASA daily. She reports being a bit lightheaded and dizzy, which is new. She denies fever or chills. She denies diarrhea. She reports some chest, this is typical for her. At its worse her chest discomfort is 7/10, currently it is 6/10. Its the same discomfort she's had since her open heart surgery. She came in because of the hematuria. History provided by patient. Her son is present at bedside. She also has some SOB, and has increased  Oxygen need. At baseline she uses 2L, now on 4L of oxygen.   Assessment / Plan / Recommendation Clinical Impression  Pt presents with reduce risk of aspiration when following general aspiration precautions. Pt consumed trial of regular snack and thin liquids via straw with no overt s/s of aspiration. Pt with general  fatigue and states that she becomes SOB during most activities but this isn't new. During consumption, pt with no obvious SOB or increased ROB. Education provided to taking rest breaks and pacing herself during consumption. Pt voiced understanding. ST to sign off at this time.  SLP Visit Diagnosis: Dysphagia, unspecified (R13.10)    Aspiration Risk  No limitations;Mild aspiration risk    Diet Recommendation Regular;Thin liquid   Liquid Administration via: Straw;Cup Medication Administration: Whole meds with liquid Supervision: Patient able to self feed Compensations: Minimize environmental distractions;Slow rate;Small sips/bites Postural Changes: Seated upright at 90 degrees    Other  Recommendations Oral Care Recommendations: Oral care BID   Follow up Recommendations None      Frequency and Duration            Prognosis        Swallow Study   General Date of Onset: 04/05/18 HPI:  This is a 78y/o female resident of [facility] who on the 5th march with a diagnosis of PE. Marland Kitchen She was anticoagulated, did not do well. Anticoagulation stopped and IVC filter placed. Since discharge she noted blood in her urine 1 week ago. It started out light and became darker. She has occasional BOU. She is no longer on blood thinners. She is still taking a baby ASA daily. She reports being a bit lightheaded and dizzy, which is new. She denies fever or chills. She denies diarrhea. She reports some chest, this is typical for her. At its worse her chest discomfort is 7/10, currently it is 6/10. Its the same discomfort she's had since her open heart surgery. She came in because of the hematuria. History provided by patient. Her son is present at bedside. She also has some SOB, and has increased  Oxygen need. At baseline she uses 2L, now on 4L of oxygen. Type of Study: Bedside Swallow Evaluation Previous Swallow Assessment: none in chart Diet Prior to this Study: Regular;Thin liquids Temperature Spikes  Noted: No Respiratory Status: Nasal cannula History of Recent Intubation: No Behavior/Cognition: Alert;Cooperative;Pleasant mood Oral Cavity Assessment: Within Functional Limits Oral Care Completed by SLP: No Oral Cavity - Dentition: Edentulous Vision: Functional for self-feeding Self-Feeding Abilities: Able to feed self Patient Positioning: Upright in bed Baseline Vocal Quality: Normal Volitional Cough: Strong Volitional Swallow: Able to elicit    Oral/Motor/Sensory Function Overall Oral Motor/Sensory Function: Within functional limits   Ice Chips Ice chips: Not tested   Thin Liquid Thin Liquid: Within functional limits Presentation: Self Fed;Straw    Nectar Thick Nectar Thick Liquid: Not tested   Honey Thick Honey Thick Liquid: Not tested   Puree Puree: Not tested   Solid   GO   Solid: Within functional limits Presentation: Self Fed        Ulysse Siemen 04/06/2018,8:09 AM

## 2018-04-06 NOTE — Progress Notes (Signed)
No changes--d/c SNF today with humidified nasal oxygen  Verneita Griffes, MD [facility] [phone]

## 2018-04-06 NOTE — Progress Notes (Signed)
Tried to call report At [facility]  But phone just keep on ringing. Will try to call again later.

## 2018-04-06 NOTE — Clinical Social Work Placement (Signed)
   CLINICAL SOCIAL WORK PLACEMENT  NOTE  Date:  04/06/2018  Patient Details  Name: [name] MRN: [id number] Date of Birth: 08/24/1941  Clinical Social Work is seeking post-discharge placement for this patient at the [place] level of care (*CSW will initial, date and re-position this form in  chart as items are completed):  Yes   Patient/family provided with [place] Work Department's list of facilities offering this level of care within the geographic area requested by the patient (or if unable, by the patient's family).  Yes   Patient/family informed of their freedom to choose among providers that offer the needed level of care, that participate in Medicare, Medicaid or managed care program needed by the patient, have an available bed and are willing to accept the patient.  Yes   Patient/family informed of 's ownership interest in [facility] and [facility], as well as of the fact that they are under no obligation to receive care at these facilities.  PASRR submitted to EDS on       PASRR number received on       Existing PASRR number confirmed on 04/03/18     FL2 transmitted to all facilities in geographic area requested by pt/family on 04/03/18     FL2 transmitted to all facilities within larger geographic area on       Patient informed that his/her managed care company has contracts with or will negotiate with certain facilities, including the following:        Yes   Patient/family informed of bed offers received.  Patient chooses bed at [facility], [facility]     Physician recommends and patient chooses bed at      Patient to be transferred to [facility] on 04/06/18.  Patient to be transferred to facility by [facility]     Patient family notified on 04/06/18 of transfer.  Name of family member notified:  Jerrye Beavers Rudisill     PHYSICIAN Please prepare prescriptions     Additional Comment:     _______________________________________________ Candie Chroman, LCSW 04/06/2018, 10:05 AM

## 2018-04-06 NOTE — Clinical Social Work Note (Signed)
CSW facilitated patient discharge including contacting patient family and facility to confirm patient discharge plans. Clinical information faxed to facility and family agreeable with plan. CSW arranged ambulance transport via [facility] to [place] at 10:45 am. RN to call report prior to discharge ([phone] Room [phone]).  CSW will sign off for now as social work intervention is no longer needed. Please consult [place] again if new needs arise.  Dayton Scrape, [place]

## 2018-04-07 LAB — CULTURE, BLOOD (ROUTINE X 2)
Culture: NO GROWTH
Culture: NO GROWTH
Special Requests: ADEQUATE
Special Requests: ADEQUATE

## 2018-04-08 ENCOUNTER — Other Ambulatory Visit: Payer: Self-pay | Admitting: *Deleted

## 2018-04-08 NOTE — Patient Outreach (Signed)
[facility]) Care Management  04/08/2018  KEALI MCCRAW 06/11/1941 [id number]   CSW received consult for pt as she is at [facility].  CSW was unsuccessful in reaching patient for initial phone assessment.  CSW was able to speak with SNF rep who indicates the pt is very deconditioned and is "max assist with therapy".  No dc date identified as of yet.   CSW will plan SNF follow up visit next week.   Eduard Clos, MSW, [place] Worker  [place] [phone]

## 2018-04-09 DIAGNOSIS — M810 Age-related osteoporosis without current pathological fracture: Secondary | ICD-10-CM | POA: Diagnosis not present

## 2018-04-09 DIAGNOSIS — D649 Anemia, unspecified: Secondary | ICD-10-CM | POA: Diagnosis not present

## 2018-04-09 DIAGNOSIS — R262 Difficulty in walking, not elsewhere classified: Secondary | ICD-10-CM | POA: Diagnosis not present

## 2018-04-09 DIAGNOSIS — N390 Urinary tract infection, site not specified: Secondary | ICD-10-CM | POA: Diagnosis not present

## 2018-04-12 ENCOUNTER — Other Ambulatory Visit: Payer: Self-pay | Admitting: *Deleted

## 2018-04-12 NOTE — Patient Outreach (Signed)
[facility]) Care Management  04/12/2018  [name] 1941-04-23 [id number]   CSW met with patient at [employer] today. Identity confirmed with address, DOB and name. CSW introduced self and role as well as [facility] program and services. Pt reports she lives alone and was independent pta. She  Was hospitalized with  "blood clots" and went to [facility] and then was re-hospitalized and transferred to [place].  She reports her son, [name] and Daughter, Jerrye Beavers are [place]. She reports becoming SOB with PT today and thus her progress with therapy is slow.  She plans to dc home at dc and has family and friends to assist as needed.  CSW will plan f/u call and/or SNF visit for updates and for further conversation about [facility] program- it appears she is interested and in need of [place] and RN involvement once released.   Eduard Clos, MSW, LCSW Clinical Social Worker

## 2018-04-16 ENCOUNTER — Other Ambulatory Visit: Payer: Self-pay | Admitting: *Deleted

## 2018-04-16 NOTE — Patient Outreach (Signed)
[facility]) Care Management  04/16/2018  [name] October 26, 1941 [id number]   Pt remains at SNF and is doing well with therapies per SNF rep. Pt anticipates dc to home once stable for release.  CSW reminded pt of [facility] services and will plan follow up call or visit to SNF next week.   Eduard Clos, MSW, [place] Worker  [place] [phone]

## 2018-04-19 ENCOUNTER — Other Ambulatory Visit: Payer: Self-pay | Admitting: *Deleted

## 2018-04-20 DIAGNOSIS — R310 Gross hematuria: Secondary | ICD-10-CM | POA: Diagnosis not present

## 2018-04-20 NOTE — Patient Outreach (Signed)
[facility]) Care Management  04/20/2018  JESENYA BOWDITCH 1941/03/14 [id number]   CSW spoke with staff at SNF who reports pt is still in rehab there and is requiring 4 LPM of oxygen. "She is motivated" and they are still planning dc home.   CSW will attempt to reach pt/family to further discuss progress and dc plans.   Eduard Clos, MSW, [place] Worker  [place] [phone]

## 2018-04-22 ENCOUNTER — Encounter: Payer: Self-pay | Admitting: Vascular Surgery

## 2018-04-22 ENCOUNTER — Ambulatory Visit (INDEPENDENT_AMBULATORY_CARE_PROVIDER_SITE_OTHER): Payer: Medicare HMO | Admitting: Vascular Surgery

## 2018-04-22 ENCOUNTER — Ambulatory Visit (HOSPITAL_COMMUNITY)
Admit: 2018-04-22 | Discharge: 2018-04-22 | Disposition: A | Discharge status: Home / Self Care | Payer: Medicare HMO | Attending: Vascular Surgery | Admitting: Vascular Surgery

## 2018-04-22 ENCOUNTER — Other Ambulatory Visit: Payer: Self-pay

## 2018-04-22 VITALS — BP 109/61 | HR 87 | Resp 20 | Ht 68.0 in | Wt 195.0 lb

## 2018-04-22 DIAGNOSIS — K551 Chronic vascular disorders of intestine: Secondary | ICD-10-CM

## 2018-04-22 DIAGNOSIS — I2510 Atherosclerotic heart disease of native coronary artery without angina pectoris: Secondary | ICD-10-CM | POA: Insufficient documentation

## 2018-04-22 DIAGNOSIS — R109 Unspecified abdominal pain: Secondary | ICD-10-CM | POA: Diagnosis not present

## 2018-04-22 DIAGNOSIS — I10 Essential (primary) hypertension: Secondary | ICD-10-CM | POA: Insufficient documentation

## 2018-04-22 DIAGNOSIS — I70209 Unspecified atherosclerosis of native arteries of extremities, unspecified extremity: Secondary | ICD-10-CM | POA: Diagnosis not present

## 2018-04-22 NOTE — Progress Notes (Signed)
Patient is a 77 year old female who returns for follow-up today.  She underwent superior mesenteric artery stenting March 2019 for chronic mesenteric ischemia symptoms.  This was complicated by some postop colitis.  She currently is residing in a skilled nursing facility.  She is on home oxygen.  She has some occasional left hip pain but does not really complain of abdominal pain at this point.  She has no postprandial pain.  She is able to eat all meals pain-free.  She states that she has not really gained her weight back but that she is currently not losing weight.  She is still very deconditioned.  She is on aspirin daily.  She is also currently receiving Ensure supplements.  She is able to walk some to transfer from bed to chair and bathroom.  However, she is in a wheelchair most of the time.  Review of systems: She gets short of breath with minimal exertion.  She denies chest pain.  Current Outpatient Medications on File Prior to Visit  Medication Sig Dispense Refill  . allopurinol (ZYLOPRIM) 100 MG tablet Take 100 mg by mouth daily.    [place] Kitchen aspirin EC 81 MG tablet Take 81 mg by mouth daily.    . citalopram (CELEXA) 20 MG tablet Take 30 mg by mouth daily.     . [facility] D3 1000 units capsule Take 1,000 Units by mouth daily.  3  . feeding supplement, ENSURE ENLIVE, (ENSURE ENLIVE) LIQD Take 237 mLs by mouth 3 (three) times daily between meals. 237 mL 12  . ferrous sulfate 325 (65 FE) MG tablet Take 325 mg by mouth daily with breakfast.    . furosemide (LASIX) 20 MG tablet Take 3 tablets (60 mg total) by mouth 2 (two) times daily. 30 tablet   . INCRUSE ELLIPTA 62.5 MCG/INH AEPB Inhale 1 puff into the lungs 2 (two) times daily.     . Ipratropium-Albuterol (COMBIVENT) 20-100 MCG/ACT AERS respimat Inhale 1 puff into the lungs every 6 (six) hours as needed for wheezing.    . magnesium oxide (MAG-OX) 400 (241.3 Mg) MG tablet Take 1 tablet (400 mg total) by mouth 2 (two) times daily.    . nitroGLYCERIN  (NITROSTAT) 0.4 MG SL tablet Place 1 tablet (0.4 mg total) under the tongue every 5 (five) minutes as needed for chest pain. For chest pain 25 tablet 3  . omeprazole (PRILOSEC) 40 MG capsule Take 40 mg by mouth 2 (two) times daily.     . Oxycodone HCl 10 MG TABS Take 1 tablet (10 mg total) by mouth every 6 (six) hours as needed. 2 tablet 0  . potassium chloride SA (K-DUR,KLOR-CON) 20 MEQ tablet Take 20 mEq by mouth daily.    . potassium chloride SA (K-DUR,KLOR-CON) 20 MEQ tablet Take 2 tablets (40 mEq total) by mouth daily.    . pramipexole (MIRAPEX) 0.25 MG tablet Take 0.25 mg by mouth at bedtime.  3  . roflumilast (DALIRESP) 500 MCG TABS tablet Take 500 mcg by mouth daily.     . traZODone (DESYREL) 50 MG tablet Take 150 mg by mouth at bedtime.      No current facility-administered medications on file prior to visit.      Physical exam:  Vitals:   04/22/18 0845  BP: 109/61  Pulse: 87  Resp: 20  SpO2: 100%  Weight: 195 lb (88.5 kg)  Height: 5\' 8"  (1.727 m)   Neck: No carotid bruits  Chest: Clear to auscultation bilaterally  Cardiac: Regular rate and  rhythm  Abdomen: Soft nontender no bruits 2+ femoral pulses bilaterally  Data: Patient had a mesenteric duplex exam today which was suboptimal due to a large amount of bowel gas.  There appeared to possibly be some narrowing of the proximal aspect of her superior mesenteric artery but the study was suboptimal.  Assessment: Possible narrowing of superior mesenteric artery stent however suboptimal ultrasound study.  Patient currently is asymptomatic.  Plan: The patient will return for follow-up with a repeat ultrasound in 3 months or sooner if she develops postprandial abdominal pain.  She was counseled today did not chew any gum the day of her prior seizure so that we get a better ultrasound visualization.  She will try to continue increasing her p.o. intake.  We will weigh her at her next appointment.  She will see our nurse  practitioner at our next office visit.  Ruta Hinds, MD [facility] Office: [phone] Pager: [phone]

## 2018-04-23 ENCOUNTER — Other Ambulatory Visit: Payer: Self-pay

## 2018-04-23 ENCOUNTER — Ambulatory Visit: Payer: Self-pay | Admitting: *Deleted

## 2018-04-23 DIAGNOSIS — I70209 Unspecified atherosclerosis of native arteries of extremities, unspecified extremity: Secondary | ICD-10-CM

## 2018-04-23 DIAGNOSIS — K551 Chronic vascular disorders of intestine: Secondary | ICD-10-CM

## 2018-04-27 ENCOUNTER — Other Ambulatory Visit: Payer: Self-pay | Admitting: *Deleted

## 2018-04-27 NOTE — Patient Outreach (Signed)
[facility] [facility]) Care Management  04/27/2018  TAMU GOLZ 1941-02-20 [id number]   CSW spoke with SNFrep who reports pt remains in SNF rehab. She is still requiring 4 LPM of oxygen and is progressing with PT and OT.  No dc date has been established yet.   CSW will call or visit SNF later in week for updates.   Eduard Clos, MSW, [place] Worker  [place] [phone]

## 2018-04-28 ENCOUNTER — Ambulatory Visit: Payer: Self-pay | Admitting: *Deleted

## 2018-04-30 ENCOUNTER — Other Ambulatory Visit: Payer: Self-pay | Admitting: *Deleted

## 2018-04-30 ENCOUNTER — Telehealth: Payer: Self-pay | Admitting: Internal Medicine

## 2018-04-30 DIAGNOSIS — R310 Gross hematuria: Secondary | ICD-10-CM | POA: Diagnosis not present

## 2018-04-30 NOTE — Telephone Encounter (Signed)
New message       [facility] Pre-operative Risk Assessment    Request for surgical clearance:  1. What type of surgery is being performed? Transurethral resection of bladder tumor  2. When is this surgery scheduled? TBD  3. What type of clearance is required (medical clearance vs. Pharmacy clearance to hold med vs. Both)? BOTH  4. Are there any medications that need to be held prior to surgery and how long?-  5. Practice name and name of physician performing surgery? [facility]- DR BELL  6. What is your office phone number [phone]   7.   What is your office fax number [phone]  8.   Anesthesia type (None, local, MAC, general) ? [place] 04/30/2018, 2:59 PM  _________________________________________________________________   (provider comments below)

## 2018-04-30 NOTE — Patient Outreach (Signed)
[facility]) Care Management  04/30/2018  CHALISA KOBLER 07-12-1941 [id number]   Pt is anticipating SNF dc to home on Monday. Hh arrangements are underway per SNF rep.  She is moving better with therapy yet is still needing assist. CSW will plan a f/u call to pt at home next week for post SNF transition and follow up support.   Eduard Clos, MSW, [place] Worker  [place] [phone]

## 2018-05-03 ENCOUNTER — Other Ambulatory Visit: Payer: Self-pay | Admitting: *Deleted

## 2018-05-03 NOTE — Patient Outreach (Signed)
[facility]) Care Management  05/03/2018  Judy NGHIEM Dec 28, 1940 [id number]   CSW spoke with pt by phone who reports she was released from SNF today and is home settling in.  "I am glad to be home, not that they weren't good to me".   Her daughter transported her home and she reports no issues getting into home. She reports she has all her DME needs and good family support. Her son is picking up her new RX's and she has concerns about her meds; "I want to take them right".  CSW had previously discussed some RX concerns she had and will consult [place] RPH as well as [facility] RNCM for follow up.  CSW reminded pt that we are separate from the [facility] agency the SNF has arranged for [facility] PT, OT,etc.   Pt denies any needs or concerns for CSW at this time. CSW will plan follow up call next week.   Eduard Clos, MSW, [place] Worker  [place] [phone]

## 2018-05-04 ENCOUNTER — Other Ambulatory Visit: Payer: Self-pay | Admitting: Pharmacist

## 2018-05-04 DIAGNOSIS — I2510 Atherosclerotic heart disease of native coronary artery without angina pectoris: Secondary | ICD-10-CM | POA: Diagnosis not present

## 2018-05-04 DIAGNOSIS — I2699 Other pulmonary embolism without acute cor pulmonale: Secondary | ICD-10-CM | POA: Diagnosis not present

## 2018-05-04 DIAGNOSIS — I4891 Unspecified atrial fibrillation: Secondary | ICD-10-CM | POA: Diagnosis not present

## 2018-05-04 DIAGNOSIS — J449 Chronic obstructive pulmonary disease, unspecified: Secondary | ICD-10-CM | POA: Diagnosis not present

## 2018-05-04 DIAGNOSIS — I110 Hypertensive heart disease with heart failure: Secondary | ICD-10-CM | POA: Diagnosis not present

## 2018-05-04 DIAGNOSIS — I5032 Chronic diastolic (congestive) heart failure: Secondary | ICD-10-CM | POA: Diagnosis not present

## 2018-05-04 NOTE — Patient Outreach (Signed)
[facility]) Care Management  05/04/2018  [name] 19-Jul-1941 [id number]  77 year old female referred to [facility] services for medication management and medication adherence.  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   Call placed to [name] today.  Patient answered but stated she was not feeling well and requested that I call her back on Thursday morning.    Plan: I will call patient back on Thursday, May 16th, in the morning.   Ralene Bathe, PharmD, [place] [phone]

## 2018-05-05 ENCOUNTER — Other Ambulatory Visit: Payer: Self-pay

## 2018-05-05 NOTE — Telephone Encounter (Signed)
Returned call to Pt. Appointment made for 05/19/18 at 215 pm with [name].

## 2018-05-05 NOTE — Patient Outreach (Signed)
Transition of care for discharge from [facility]:  05/03/2018  New referral:  Placed call to patient. No answer on cell or home phone. Placed call to son ( who is on consent) no answer. Left a message requesting a call back.  PLAN: will mail outreach letter and call in 4 days.  Tomasa Rand, RN, BSN, CEN [facility] [employer] [phone]

## 2018-05-05 NOTE — Telephone Encounter (Signed)
Pt was hospitalized > 60 days since we saw her last in Nov 2018. I feel it would be best if she was seen before clearance is granted. I spoke with the patient and she is in agreement.   Kerin Ransom PA-C 05/05/2018 2:11 PM

## 2018-05-06 ENCOUNTER — Ambulatory Visit: Payer: Self-pay | Admitting: Pharmacist

## 2018-05-06 ENCOUNTER — Other Ambulatory Visit: Payer: Self-pay | Admitting: Pharmacist

## 2018-05-06 DIAGNOSIS — J449 Chronic obstructive pulmonary disease, unspecified: Secondary | ICD-10-CM | POA: Diagnosis not present

## 2018-05-06 DIAGNOSIS — I110 Hypertensive heart disease with heart failure: Secondary | ICD-10-CM | POA: Diagnosis not present

## 2018-05-06 DIAGNOSIS — I4891 Unspecified atrial fibrillation: Secondary | ICD-10-CM | POA: Diagnosis not present

## 2018-05-06 DIAGNOSIS — I2510 Atherosclerotic heart disease of native coronary artery without angina pectoris: Secondary | ICD-10-CM | POA: Diagnosis not present

## 2018-05-06 DIAGNOSIS — I2699 Other pulmonary embolism without acute cor pulmonale: Secondary | ICD-10-CM | POA: Diagnosis not present

## 2018-05-06 DIAGNOSIS — I5032 Chronic diastolic (congestive) heart failure: Secondary | ICD-10-CM | POA: Diagnosis not present

## 2018-05-06 NOTE — Patient Outreach (Signed)
[facility] [facility]) Care Management  05/06/2018  [name] 11/12/1941 [id number]  Successful call to [name] today regarding medication management following recent hospitalization and SNF stay.  Patient reports her daughter, Jerrye Beavers, fills a pillbox for her weekly and that medications were changed during SNF which has confused her.  She has appointment tomorrow with PCP Dr. [name] to review medication changes.  We have tentatively scheduled a home visit for next week to go over all medications face to face.  Patient will ask daughter, Jerrye Beavers, to be present if possible for visit.    Plan: Home visit with patient on Monday, May 20th at 10:00AM  Ralene Bathe, PharmD, [place] [phone]

## 2018-05-07 DIAGNOSIS — R319 Hematuria, unspecified: Secondary | ICD-10-CM | POA: Diagnosis not present

## 2018-05-07 DIAGNOSIS — R5381 Other malaise: Secondary | ICD-10-CM | POA: Diagnosis not present

## 2018-05-07 DIAGNOSIS — I2510 Atherosclerotic heart disease of native coronary artery without angina pectoris: Secondary | ICD-10-CM | POA: Diagnosis not present

## 2018-05-10 ENCOUNTER — Other Ambulatory Visit: Payer: Self-pay | Admitting: Pharmacist

## 2018-05-10 ENCOUNTER — Other Ambulatory Visit: Payer: Self-pay

## 2018-05-10 NOTE — Patient Outreach (Addendum)
[place] [facility]) Care Management  [place]   05/11/2018  [name] August 25, 1941 [id number]   77 year old female referred to [place] services for medication management and medication adherence.  PMHx includes, but not limited to, bipolar disorder, GERD, OSA, PAD, heart failure (EF 55-60% 4/'19), hypothyroidism, HTN, HLD, COPD, CAD, hx SMA thrombosis and DVT, developed life-threatening abdominal hematoma with anticoagulation now with IVC filter, with recent hospitalization for hematuria.   Successful home visit with [name] and daughter, [name] (contact [phone]). HIPAA identifiers verified.   Subjective: "I feel weak and shaky."   Patient reports that she is not sure what medications she should be taking after recent stay in SNF.  She thinks that there were some medication changes but can't remember.  She had office visit with PCP, Dr. Mellody Memos, with [facility], last week.  Patient requests assistance with filling pillbox and medication adherence .  Objective:  Encounter Medications: Medications Reviewed Today    Reviewed by Rudean Haskell, RPH (Pharmacist) on 05/11/18 at 68  Med List Status: <None>  Medication Order Taking? Sig Documenting Provider Last Dose Status Informant  allopurinol (ZYLOPRIM) 100 MG tablet [id number] Yes Take 100 mg by mouth daily. [provider] Taking Active Pharmacy Records  aspirin EC 81 MG tablet [id number] Yes Take 81 mg by mouth daily. [provider] Taking Active Pharmacy Records  citalopram (CELEXA) 20 MG tablet [id number] Yes Take 30 mg by mouth daily.  [provider] Taking Active Pharmacy Records           Med Note (COX, HEATHER C   Mon Aug 17, 2017  9:22 AM)    CVS D3 1000 units capsule [id number] Yes Take 1,000 Units by mouth daily. [provider] Taking Active Pharmacy Records  cyclobenzaprine (FLEXERIL) 10 MG tablet [id number] Yes Take  10 mg by mouth 3 (three) times daily.  [provider] Taking Active   docusate sodium (COLACE) 100 MG capsule [id number] Yes Take 100 mg by mouth daily. [provider] Taking Active   feeding supplement, ENSURE ENLIVE, (ENSURE ENLIVE) LIQD [id number] No Take 237 mLs by mouth 3 (three) times daily between meals.  Patient not taking:  Reported on 05/10/2018   Cristal Ford, DO Not Taking Active Pharmacy Records  ferrous sulfate 325 (65 FE) MG tablet [id number] No Take 325 mg by mouth daily with breakfast. [provider] Not Taking Active Pharmacy Records  furosemide (LASIX) 20 MG tablet [id number] Yes Take 60 mg by mouth daily. [provider] Taking Active   furosemide (LASIX) 20 MG tablet [id number] Yes Take 40 mg by mouth daily at 2 PM. [provider] Taking Active   INCRUSE ELLIPTA 62.5 MCG/INH AEPB [id number] Yes Inhale 1 puff into the lungs 2 (two) times daily.  [provider] Taking Active Pharmacy Records  Ipratropium-Albuterol (COMBIVENT) 20-100 MCG/ACT AERS respimat [id number] Yes Inhale 1 puff into the lungs every 6 (six) hours as needed for wheezing. [provider] Taking Active Pharmacy Records  ipratropium-albuterol (DUONEB) 0.5-2.5 (3) MG/3ML SOLN [id number] Yes Take 3 mLs by nebulization 4 (four) times daily. [provider] Taking Active   magnesium oxide (MAG-OX) 400 (241.3 Mg) MG tablet [id number] Yes Take 1 tablet (400 mg total) by mouth 2 (two) times daily. Cristal Ford, DO Taking Active Pharmacy Records  nitroGLYCERIN (NITROSTAT) 0.4 MG SL tablet [id number] Yes Place 1 tablet (0.4 mg total) under the tongue every 5 (five) minutes  as needed for chest pain. For chest pain Fay Records, MD Taking Active Pharmacy Records  omeprazole (PRILOSEC) 40 MG capsule [id number] Yes Take 40 mg by mouth 2 (two) times daily.  [provider] Taking Active Pharmacy Records           Med Note Ricci Barker  Feb 28, 2018  9:10 PM)    Oxycodone HCl 10 MG TABS [id number] Yes Take 1 tablet (10 mg total) by mouth every 6 (six) hours as needed. Nita Sells, MD Taking Active   pilocarpine [facility]) 1 % ophthalmic solution [id number] Yes Place 1 drop into both eyes 2 (two) times daily. [provider] Taking Active   potassium chloride SA (K-DUR,KLOR-CON) 20 MEQ tablet [id number] Yes Take 20 mEq by mouth daily. [provider] Taking Active Pharmacy Records  pramipexole (MIRAPEX) 0.25 MG tablet [id number] Yes Take 0.25 mg by mouth at bedtime. [provider] Taking Active Pharmacy Records  roflumilast (DALIRESP) 500 MCG TABS tablet [id number] Yes Take 500 mcg by mouth daily.  [provider] Taking Active Pharmacy Records  traZODone (DESYREL) 50 MG tablet [id number] Yes Take 150 mg by mouth at bedtime.  [provider] Taking Active Pharmacy Records          Assessment:  Drugs sorted by system:  Neurologic/Psychologic: Citalopram, pramipexole, trazodone  Cardiovascular: Aspirin 81mg , furosemide, PRN NTG  Pulmonary/Allergy: Incruse Ellipta, ipratropium-albuterol inhaler and nebulizer, roflumilast  Gastrointestinal: feeding supplement, ferrous sulfate, omeprazole, docusate  Topical: pilocarpine  Pain: cyclobenzaprine, oxycodone  Vitamins/Minerals: Vitamin D3, magnesium oxide, potassium  Infectious Diseases: sulfamethoxazole-trimethoprim  Miscellaneous: Allopurinol  Duplications in therapy: none Gaps in therapy: none  Medications to avoid in the elderly:  Oxycodone: Use with caution in the elderly; may be more sensitive to adverse effects, such as life-threatening respiratory depression. Per Beers, avoid use in geriatric patients with a history of falls or fractures except for pain management due to recent fractures or joint replacement. Consider the use of alternative non-opioid analgesics in these patients if possible.   Drug interactions:   Cyclobenzaprine + citalopram: Coadministration of cyclobenzaprine with citalopram may increase serotonergic activity. Serotonin syndrome may result. Caution should be exercised with this combination.  Citalopram + trazodone: Additive QT interval prolongation may occur during coadministration of citalopram and traZODone. Coadministration of citalopram and traZODone is not recommended.  If concurrent use clinically warranted, monitor QTc closely.   Omeprazole + citalopram: Plasma concentrations and toxic effects of citalopram may be increased by concomitant administration of omeprazole. Specifically, citalopram doses greater than 20 mg/day are not recommended in patients receiving omeprazole according to official package labeling due to the risk of QT prolongation.  Medication adherence: Pillbox currently filled by home health nurse or daughter.  Several days missing for the remainder of this week and furosemide, ferrous sulfate, magnesium not included at all.  No long term plan for who will assist with filling this box after home health is finished.  Patient and daughter are agreeable to try compliance packaging.  Daughter spoke with [employer], which patient previously used and prefers, and requested all medications transferred.  Cost will be $10/ month. I assisted patient with filling pillbox for the remainder of the week.     Medication management:   Out of ferrous sulfate, magnesium, Incruse Ellipta  Refill request for inhaler placed at [facility], oral medications can be included in compliance packaging  Several expired bottles of medications in house including Xarelto, atorvastatin, previous strengths of furosemide, lisinopril, pilocarpine  With permission, I removed expired medications and disposed of them at police station  Taking Incruse Ellipta BID rather than Qday  Counseled patient on correct dosing for inhaler. Patient voiced understanding.   Lasix instructions  different from how patient taking.   Counseled patient to take as prescribed at PCP visit last week.  Patient voiced understanding.   Cyclobenzaprine and furosemide not included in pillbox therefore patient has not been taking these medications at all.    I assisted patient with taking morning dose of medications.   Patient has both albuterol-ipratropium inhaler and nebulizer at home and has been using both.    I counseled patient to only take one or the other and not both as this duplicate therapy may be contributing to her feeling shaky and anxious.     Plan:  Call placed to Dr. Burnett Harry office.  Spoke with RN who reviewed current medication list from PCP and clarified furosemide dose as 60mg  qAM and 40mg  in the afternoon.    Call placed to Dr. Alan Ripper office.  Message left to clarify medications prescribed by cardiologist as patient has old bottles of atorvastatin and isosorbide and is not taking either medication currently.     Call placed to [facility] to request immediate fills of Incruse Ellipta if active prescriptions available from [facility] and to confirm compliance packaging.   Pharmacy will call me back after medications transferred to review list and clarify which medications to include in compliance packaging.   I will await callback from Dr. Harrington Challenger and [facility] pharmacy regarding medications and will collaborate with all providers as necessary to ensure patient has prescriptions for all up-to-date medications.  New prescription for furosemide will be required with new directions.   Once all medications received from [facility], I will assist with coordinating initiation of compliance packaging.   Ralene Bathe, PharmD, [place] [phone]

## 2018-05-10 NOTE — Patient Outreach (Signed)
Transition of care: 2nd attempt to reach patient. Successful. Patient reports that she continues to be weak. Reports she has all her medications and is taking them as prescribed. Reports [place] will start pill packing soon. Reports she is active with [facility] for nursing and PT. Reports she thinks doing well.  Offered home visit for 05/13/2018 and patient has accepted.   PLAN: will complete care plan at home visit.  Confirmed address and provided my contact information.  Tomasa Rand, RN, BSN, CEN [facility] [employer] [phone]

## 2018-05-11 ENCOUNTER — Telehealth: Payer: Self-pay | Admitting: Pharmacist

## 2018-05-11 ENCOUNTER — Telehealth: Payer: Self-pay | Admitting: Internal Medicine

## 2018-05-11 DIAGNOSIS — I4891 Unspecified atrial fibrillation: Secondary | ICD-10-CM | POA: Diagnosis not present

## 2018-05-11 DIAGNOSIS — I2699 Other pulmonary embolism without acute cor pulmonale: Secondary | ICD-10-CM | POA: Diagnosis not present

## 2018-05-11 DIAGNOSIS — I5032 Chronic diastolic (congestive) heart failure: Secondary | ICD-10-CM | POA: Diagnosis not present

## 2018-05-11 DIAGNOSIS — I110 Hypertensive heart disease with heart failure: Secondary | ICD-10-CM | POA: Diagnosis not present

## 2018-05-11 DIAGNOSIS — J449 Chronic obstructive pulmonary disease, unspecified: Secondary | ICD-10-CM | POA: Diagnosis not present

## 2018-05-11 DIAGNOSIS — I2510 Atherosclerotic heart disease of native coronary artery without angina pectoris: Secondary | ICD-10-CM | POA: Diagnosis not present

## 2018-05-11 NOTE — Telephone Encounter (Signed)
New Message   Pt c/o medication issue:  1. Name of Medication: atorvastin and imdur   2. How are you currently taking this medication (dosage and times per day)?   3. Are you having a reaction (difficulty breathing--STAT)?  4. What is your medication issue? [facility] pharmacist is needing to confirm whether or not the patient should be taking these medications. Please call to discuss.

## 2018-05-11 NOTE — Telephone Encounter (Signed)
Patient needs to stay on slipitor.   She can review with scott on 5/29

## 2018-05-11 NOTE — Patient Outreach (Signed)
[place] [facility]) Care Management  05/11/2018  [name] 11-13-1941 [id number]   Incoming call received from [facility].  Medication list reviewed with pharmacist and the following prescriptions will need to be sent from Dr. [name]: Potassium, Furosemide (with new instructions), omeprazole, PRN NTG, Combivent.  OTC medications (aspirin 81mg , ferrous sulfate, and mag ox) can be included monthly without prescription.  Pharmacist aware that cardiologist still waiting to confirm whether patient should be taking atorvastatin and isosorbide.  Pharmacist will clarify with patient if cyclobenzaprine should be left separately in pill bottle as patient may start taking this PRN rather than scheduled.  Incruse Ellipta prescription is ready to be picked up and family is aware.   Call placed to Dr. Doran Durand office to request new prescriptions as above be sent to [facility].   Plan: I will follow-up with patient later this week and collaborate as needed with pharmacy and providers to ensure all appropriate medications included in compliance packaging.   Ralene Bathe, PharmD, [place] [phone]

## 2018-05-11 NOTE — Telephone Encounter (Signed)
Left message for [name] with [place] [phone] to call back re: Dr. Harrington Challenger recommendation to start back on Lipitor and to review other meds with [name] at appt 05/19/18.

## 2018-05-11 NOTE — Telephone Encounter (Signed)
Spoke with [name] with [facility]. Pt was referred to her for RX help since she has been a poor historian and having trouble keeping up with her meds. She was asking specifically about her Atoravastatin and Imdur. They appear to have been taken off of her med list at her 5/2 visit with vascular surgery..the patient may have reported that she was not taking them. Colleen with [facility] reports she thinks pt has been off it at least 6 months. She has been trying to help patient to get back on her prescribed RX plan but needs to know if we want pt to restart and continue these meds. Advised her that I will talk with Dr. Harrington Challenger and pt also seeing [name] for clearance 05/19/18...will clarify what she needs to do going forward.

## 2018-05-12 ENCOUNTER — Encounter: Payer: Self-pay | Admitting: *Deleted

## 2018-05-12 ENCOUNTER — Other Ambulatory Visit: Payer: Self-pay | Admitting: *Deleted

## 2018-05-12 NOTE — Patient Outreach (Signed)
[place] [facility]) Care Management  05/12/2018  [name] 08-22-1941 [id number]   CSW made a follow up call to pt at home today. She is pleased that [facility] is helping to look into options to help her with her RX management.  "she's got that underway".  She is reminded that [place] to visit tomorrow and she denies any CSW needs or concerns.  Pt states, "I want to get a small oxygen tank" and is advised to talk with her Physician about this as they would have to ok and order it.   Pt reports doing good at home post SNF stay.  CSW will plan to close CSW consult and advise [facility] team and PCP.   Eduard Clos, MSW, [place] Worker  [place] [phone]

## 2018-05-13 ENCOUNTER — Other Ambulatory Visit: Payer: Self-pay

## 2018-05-13 ENCOUNTER — Other Ambulatory Visit: Payer: Self-pay | Admitting: Pharmacist

## 2018-05-13 DIAGNOSIS — I5032 Chronic diastolic (congestive) heart failure: Secondary | ICD-10-CM | POA: Diagnosis not present

## 2018-05-13 DIAGNOSIS — I2699 Other pulmonary embolism without acute cor pulmonale: Secondary | ICD-10-CM | POA: Diagnosis not present

## 2018-05-13 DIAGNOSIS — I110 Hypertensive heart disease with heart failure: Secondary | ICD-10-CM | POA: Diagnosis not present

## 2018-05-13 DIAGNOSIS — I2510 Atherosclerotic heart disease of native coronary artery without angina pectoris: Secondary | ICD-10-CM | POA: Diagnosis not present

## 2018-05-13 DIAGNOSIS — I4891 Unspecified atrial fibrillation: Secondary | ICD-10-CM | POA: Diagnosis not present

## 2018-05-13 DIAGNOSIS — J449 Chronic obstructive pulmonary disease, unspecified: Secondary | ICD-10-CM | POA: Diagnosis not present

## 2018-05-13 NOTE — Patient Outreach (Signed)
[place] [facility]) Care Management   05/13/2018  [name] 06/01/1941 [id number]  [name] is an 77 y.o. female  Subjective: Patient reports that she has severe abdominal pain started 02/28/2018.  Now with hematoma. Awaiting clearance for kidney surgery.  Reports that she is feeling stronger since nursing home discharge. Currently family is providing 24 hour care.   Patient is active with [facility] with home health nursing and PT. Patient reports her biggest issue with weakness. Patient reports that [facility] will start pill packing for her to help her with her medications.   Objective:  Awake and alert. Ambulating with walker.  Today's Vitals   05/13/18 1011 05/13/18 1015  BP: 100/60   Pulse: 85   Resp: 20   SpO2: 95%   Weight: 168 lb (76.2 kg)   Height: 1.727 m (5\' 8" )   PainSc:  6    Review of Systems  Constitutional: Positive for malaise/fatigue.  HENT: Negative.   Eyes: Negative.   Respiratory: Positive for sputum production.        Shortness of breath with activity  Cardiovascular: Positive for leg swelling.  Gastrointestinal: Positive for abdominal pain and vomiting.       Reports 2 episodes of vomiting in 1 week  Genitourinary: Negative.   Musculoskeletal: Positive for back pain, falls and joint pain.  Skin:       Dry skin  Neurological: Negative.   Endo/Heme/Allergies: Bruises/bleeds easily.  Psychiatric/Behavioral: Positive for depression and memory loss. The patient is nervous/anxious and has insomnia.     Physical Exam  Constitutional: She appears well-developed and well-nourished.  Cardiovascular: Normal rate, regular rhythm, normal heart sounds and intact distal pulses.  Respiratory: Effort normal and breath sounds normal.  GI: Soft. Bowel sounds are normal.  Musculoskeletal: Normal range of motion. She exhibits edema.  Neurological: She is alert.  2019, [place], Thursday, April  Skin: Skin is warm and dry.  Psychiatric:  She has a normal mood and affect. Her behavior is normal. Judgment and thought content normal.    Encounter Medications:   Outpatient Encounter Medications as of 05/13/2018  Medication Sig  . allopurinol (ZYLOPRIM) 100 MG tablet Take 100 mg by mouth daily.  Marland Kitchen aspirin EC 81 MG tablet Take 81 mg by mouth daily.  Marland Kitchen atorvastatin (LIPITOR) 20 MG tablet Take 20 mg by mouth daily.  . citalopram (CELEXA) 20 MG tablet Take 30 mg by mouth daily.   . [employer] D3 1000 units capsule Take 1,000 Units by mouth daily.  . cyclobenzaprine (FLEXERIL) 10 MG tablet Take 10 mg by mouth 3 (three) times daily as needed for muscle spasms.   Marland Kitchen docusate sodium (COLACE) 100 MG capsule Take 100 mg by mouth daily.  . feeding supplement, ENSURE ENLIVE, (ENSURE ENLIVE) LIQD Take 237 mLs by mouth 3 (three) times daily between meals.  . ferrous sulfate 325 (65 FE) MG tablet Take 325 mg by mouth daily with breakfast.  . furosemide (LASIX) 20 MG tablet Take 20 mg by mouth 3 (three) times daily.   . INCRUSE ELLIPTA 62.5 MCG/INH AEPB Inhale 1 puff into the lungs 2 (two) times daily.   . Ipratropium-Albuterol (COMBIVENT) 20-100 MCG/ACT AERS respimat Inhale 1 puff into the lungs every 6 (six) hours as needed for wheezing.  Marland Kitchen ipratropium-albuterol (DUONEB) 0.5-2.5 (3) MG/3ML SOLN Take 3 mLs by nebulization 4 (four) times daily.  . isosorbide mononitrate (IMDUR) 60 MG 24 hr tablet Take 60 mg by mouth daily.  Marland Kitchen  magnesium oxide (MAG-OX) 400 (241.3 Mg) MG tablet Take 1 tablet (400 mg total) by mouth 2 (two) times daily.  . nitroGLYCERIN (NITROSTAT) 0.4 MG SL tablet Place 1 tablet (0.4 mg total) under the tongue every 5 (five) minutes as needed for chest pain. For chest pain  . omeprazole (PRILOSEC) 40 MG capsule Take 40 mg by mouth 2 (two) times daily.   . Oxycodone HCl 10 MG TABS Take 1 tablet (10 mg total) by mouth every 6 (six) hours as needed.  . pilocarpine (PILOCAR) 1 % ophthalmic solution Place 1 drop into both eyes 2 (two) times  daily.  . potassium chloride SA (K-DUR,KLOR-CON) 20 MEQ tablet Take 20 mEq by mouth daily.  . pramipexole (MIRAPEX) 0.25 MG tablet Take 0.25 mg by mouth at bedtime.  . roflumilast (DALIRESP) 500 MCG TABS tablet Take 500 mcg by mouth daily.   . roflumilast (DALIRESP) 500 MCG TABS tablet Take 500 mcg by mouth daily.  . traZODone (DESYREL) 50 MG tablet Take 150 mg by mouth at bedtime.   . [DISCONTINUED] furosemide (LASIX) 20 MG tablet Take 60 mg by mouth daily.   No facility-administered encounter medications on file as of 05/13/2018.     Functional Status:   In your present state of health, do you have any difficulty performing the following activities: 05/13/2018 04/02/2018  Hearing? N N  Vision? N N  Difficulty concentrating or making decisions? Y N  Walking or climbing stairs? Y Y  Dressing or bathing? N Y  Doing errands, shopping? [name]  Preparing Food and eating ? Y -  Using the Toilet? N -  In the past six months, have you accidently leaked urine? Y -  Do you have problems with loss of bowel control? N -  Managing your Medications? [facility] is going start pill packing. -  Managing your Finances? Y -  Comment Son manages money -  Housekeeping or managing your Housekeeping? N -  Some recent data might be hidden    Fall/Depression Screening:    Fall Risk  05/13/2018 12/07/2017 11/04/2017  Falls in the past year? Yes No No  Comment - - -  Number falls in past yr: 1 - -  Injury with Fall? No - -  Risk for fall due to : History of fall(s) - -  Follow up Falls evaluation completed - -   PHQ 2/9 Scores 05/13/2018 11/04/2017 05/26/2017 03/24/2017 03/23/2017 03/13/2017 02/26/2017  PHQ - 2 Score 6 3 2 3 3 4 6   PHQ- 9 Score 20 6 5 10 10 15 18     Assessment:   (1) Reviewed [facility] case management program. Provided new patient packet. Reviewed consent and consent signed. Reviewed contact information and [employer] call a nurse line. Provided [facility] calendar. (2) heart failure:  Not currently  weighing. Reports taking all medications as prescribed. 1 plus edema to lower legs.  (3) COPD: reports breathing is doing pretty well.  Reports she is wearing her oxygen about 10 hours per day. Using her nebs and inhalers as prescribed.  (4) new abdominal hematoma:  Patient reports lower abdominal pain on and off.  (5) weakness.  Reports her legs are weak.  Active with physical therapy. (6) high risk for falls. Reports 1 fall this week.  Bed is high and uses a step stool to get in bed.  (7) positive depression screening. Reports feeling depressed. Denies wanting any counseling. Plan:  (1) Consent scanned into chart. Reviewed weekly outreaches via  phone. (2) Reviewed importance of daily weights. Reviewed heart failure zones and when to call MD. Provided heart failure zones print out in [facility] calendar. Encouraged low salt diet. (3) Reviewed COPD zones and when to call MD. Encouraged patient to use her oxygen as directed. Reviewed early signs of treatment for signs of shortness of breath. (4) reviewed need to call MD to report a change in symptoms or worsening condition.  (5) reviewed importance of good nutrition and staying active to improve strength. Review importance of continuing to do activities as per home health PT. (6) reviewed options for a different bed options with granddaughter and patient. Reviewed non slip sock and use of walker at all times.  (7) reviewed with patient her high depression score. Offered [facility] social worker for counseling and patient declines. Will send this note to MD. Encouraged patient to get out of the house as often as she can and see friends.   Care planning and goal setting with patient. Primary goal is avoid readmission to the hospital.  This note and barrier letter sent to MD.  [facility] CM Care Plan Problem One     Most Recent Value  Care Plan Problem One  Patient with recent admisson for abdominal hematoma.   Role Documenting the Problem One  Care Management  [place] for Problem One  Active  [facility] Long Term Goal   Patient will report no readmissions to the hospital for complications of abdominal hematoma in the next 60 days.   [facility] Long Term Goal Start Date  05/13/18  Interventions for Problem One Long Term Goal  Home visit completed. Reviewed when to call MD. Encouraged patient to call for increased abdominal , dizzy or any signs of bleeding.  [facility] CM Short Term Goal #1   Patient will report weighing and recording weights daily for the next 30 days.   [facility] CM Short Term Goal #1 Start Date  05/13/18  Interventions for Short Term Goal #1  Reviewed heart failure zones, reviewed importance of daily weights. Reviewed with patient and granddaughter when to call MD for a weight gain greater thatn 3 pounds over night or 5 pounds in a week.   [facility] CM Short Term Goal #2   Patient will reports no falls iin the next 30 days.   [facility] CM Short Term Goal #2 Start Date  05/13/18  Interventions for Short Term Goal #2  Reviewed fall precautions, Reviewed use of non slip socks and assistive devices.       Tomasa Rand, RN, BSN, CEN [facility] [employer] [phone]

## 2018-05-13 NOTE — Patient Outreach (Signed)
[place] [facility]) Care Management  05/13/2018  [name] Aug 15, 1941 [id number]  Care coordination call placed to Dr. [name].  Omeprazole and Potassium prescriptions faxed to [facility] this morning.  RN will also fax over Furosemide, PRN NTG, and Combivent today.   Noted per review of [facility] that cardiologist would like patient to resume Lipitor.  Note routed to RN to request new prescription be sent to [facility].   Care coordination call placed to [facility].  Pharmacy aware of all medications and will fill compliance package likely tomorrow for patient and will contact family once it is ready.    Plan: I will follow-up with family tomorrow afternoon regarding medications.   Ralene Bathe, PharmD, [place] [phone]

## 2018-05-14 ENCOUNTER — Other Ambulatory Visit: Payer: Self-pay | Admitting: Internal Medicine

## 2018-05-14 ENCOUNTER — Other Ambulatory Visit: Payer: Self-pay | Admitting: Pharmacist

## 2018-05-14 ENCOUNTER — Ambulatory Visit: Payer: Self-pay | Admitting: Pharmacist

## 2018-05-14 DIAGNOSIS — I2699 Other pulmonary embolism without acute cor pulmonale: Secondary | ICD-10-CM | POA: Diagnosis not present

## 2018-05-14 DIAGNOSIS — J449 Chronic obstructive pulmonary disease, unspecified: Secondary | ICD-10-CM | POA: Diagnosis not present

## 2018-05-14 DIAGNOSIS — I2510 Atherosclerotic heart disease of native coronary artery without angina pectoris: Secondary | ICD-10-CM | POA: Diagnosis not present

## 2018-05-14 DIAGNOSIS — I4891 Unspecified atrial fibrillation: Secondary | ICD-10-CM | POA: Diagnosis not present

## 2018-05-14 DIAGNOSIS — I5032 Chronic diastolic (congestive) heart failure: Secondary | ICD-10-CM | POA: Diagnosis not present

## 2018-05-14 DIAGNOSIS — I110 Hypertensive heart disease with heart failure: Secondary | ICD-10-CM | POA: Diagnosis not present

## 2018-05-14 MED ORDER — ATORVASTATIN CALCIUM 20 MG PO TABS: 20.0000 mg | ORAL_TABLET | Freq: Every day | ORAL | 1 refills | Status: DC

## 2018-05-14 NOTE — Patient Outreach (Signed)
[place] [facility]) Care Management  05/14/2018  [name] 1941/08/25 [id number]   Care coordination call to Dr. Alan Ripper office as no response from inbasket message regarding Lipitor.  Office will send in new prescription for Lipitor today.    Successful call to [name].  HIPAA identifiers verified. Family has picked up Incruse Ellipta inhaler and patient is now taking once daily.  She believes that the pharmacy will start delivering compliance packs next week.   Plan: I will follow-up with [name] next week to ensure she understands how to use compliance packs.   Ralene Bathe, PharmD, [place] [phone]

## 2018-05-14 NOTE — Telephone Encounter (Signed)
Requesting a refill on Atorvastatin. Would Dr. Harrington Challenger like to refill this medication? Please address

## 2018-05-14 NOTE — Telephone Encounter (Signed)
New Message:        *STAT* If patient is at the pharmacy, call can be transferred to refill team.   1. Which medications need to be refilled? (please list name of each medication and dose if known) atorvastatin (LIPITOR) 20 MG tablet  2. Which pharmacy/location (including street and city if local pharmacy) is medication to be sent to?[place], [place]  3. Do they need a 30 day or 90 day supply? [place]

## 2018-05-14 NOTE — Telephone Encounter (Signed)
Pt's medication was sent to pt's pharmacy as requested. Confirmation received.  °

## 2018-05-14 NOTE — Telephone Encounter (Signed)
Yes, pt should have atorvastatin refilled.

## 2018-05-18 DIAGNOSIS — I4891 Unspecified atrial fibrillation: Secondary | ICD-10-CM | POA: Diagnosis not present

## 2018-05-18 DIAGNOSIS — I2510 Atherosclerotic heart disease of native coronary artery without angina pectoris: Secondary | ICD-10-CM | POA: Diagnosis not present

## 2018-05-18 DIAGNOSIS — I110 Hypertensive heart disease with heart failure: Secondary | ICD-10-CM | POA: Diagnosis not present

## 2018-05-18 DIAGNOSIS — J449 Chronic obstructive pulmonary disease, unspecified: Secondary | ICD-10-CM | POA: Diagnosis not present

## 2018-05-18 DIAGNOSIS — I5032 Chronic diastolic (congestive) heart failure: Secondary | ICD-10-CM | POA: Diagnosis not present

## 2018-05-18 DIAGNOSIS — I2699 Other pulmonary embolism without acute cor pulmonale: Secondary | ICD-10-CM | POA: Diagnosis not present

## 2018-05-19 ENCOUNTER — Encounter: Payer: Self-pay | Admitting: Physician Assistant

## 2018-05-19 ENCOUNTER — Other Ambulatory Visit: Payer: Self-pay | Admitting: Pharmacist

## 2018-05-19 ENCOUNTER — Ambulatory Visit: Payer: Self-pay | Admitting: Pharmacist

## 2018-05-19 ENCOUNTER — Telehealth: Payer: Self-pay | Admitting: *Deleted

## 2018-05-19 ENCOUNTER — Other Ambulatory Visit: Payer: Self-pay

## 2018-05-19 ENCOUNTER — Ambulatory Visit (INDEPENDENT_AMBULATORY_CARE_PROVIDER_SITE_OTHER): Payer: Medicare HMO | Admitting: Physician Assistant

## 2018-05-19 VITALS — BP 124/52 | HR 88 | Ht 68.0 in | Wt 160.0 lb

## 2018-05-19 DIAGNOSIS — I779 Disorder of arteries and arterioles, unspecified: Secondary | ICD-10-CM | POA: Diagnosis not present

## 2018-05-19 DIAGNOSIS — I10 Essential (primary) hypertension: Secondary | ICD-10-CM

## 2018-05-19 DIAGNOSIS — I2510 Atherosclerotic heart disease of native coronary artery without angina pectoris: Secondary | ICD-10-CM

## 2018-05-19 DIAGNOSIS — Z01810 Encounter for preprocedural cardiovascular examination: Secondary | ICD-10-CM

## 2018-05-19 DIAGNOSIS — K551 Chronic vascular disorders of intestine: Secondary | ICD-10-CM | POA: Diagnosis not present

## 2018-05-19 DIAGNOSIS — I5032 Chronic diastolic (congestive) heart failure: Secondary | ICD-10-CM | POA: Diagnosis not present

## 2018-05-19 DIAGNOSIS — E785 Hyperlipidemia, unspecified: Secondary | ICD-10-CM | POA: Diagnosis not present

## 2018-05-19 DIAGNOSIS — Z86711 Personal history of pulmonary embolism: Secondary | ICD-10-CM

## 2018-05-19 DIAGNOSIS — I739 Peripheral vascular disease, unspecified: Secondary | ICD-10-CM

## 2018-05-19 MED ORDER — ATORVASTATIN CALCIUM 20 MG PO TABS: 20.0000 mg | ORAL_TABLET | Freq: Every day | ORAL | 3 refills | Status: DC

## 2018-05-19 NOTE — Patient Outreach (Signed)
[facility]) Care Management  05/19/2018  [name] 01-17-1941 [id number]  Successful call to [name] and her [name], [name], today regarding medications. HIPAA identifiers verified. [name] reports that she has not started on compliance packaging yet.  [name] will call pharmacy to see if packs are ready, and if so, will pick them up today.   They are agreeable that I call in a few days to ensure that patient is using compliance packs appropriately.   Plan: I will follow-up with [name] later this week.   Ralene Bathe, PharmD, [place] [phone]

## 2018-05-19 NOTE — Patient Instructions (Signed)
Medication Instructions:  1. START ATORVASTATIN 20 MG DAILY; NEW RX HAS BEEN SENT IN    Labwork: 1. TODAY BMET  2. 07/21/18 FASTING LIPID AND LIVER PANEL; NOTHING TO EAT AFTER MIDNIGHT THE NIGHT BEFORE LAB WORK; MORNING MEDICATIONS WITH WATER IS FINE  Testing/Procedures: Your physician has requested that you have a carotid duplex. This test is an ultrasound of the carotid arteries in your neck. It looks at blood flow through these arteries that supply the brain with blood. Allow one hour for this exam. There are no restrictions or special instructions.    Follow-Up: DR. Harrington Challenger IN 2 MONTHS OR [facility],,PAC SAME DAY DR. Harrington Challenger IS IN THE OFFICE IF POSSIBLE   Any Other Special Instructions Will Be Listed Below (If Applicable).     If you need a refill on your cardiac medications before your next appointment, please call your pharmacy.

## 2018-05-19 NOTE — Telephone Encounter (Signed)
Per Judy Dopp, PA I called [facility] to verify if his ov note from today had been recv'd. I s/w a nurse who states she will check with the doctor to see if they rcv'd the ov note. I asked if not to please let me know and I will be happy to manually fax ov note to their office.

## 2018-05-19 NOTE — Patient Outreach (Signed)
Transition of care:  Placed call to patient who reports that she is feeling good. Reports no changes in depression. States that her legs are stronger. Denies any new falls. Reports she is going to see cardiology today. Daughter to take to appointment. Denies any new concerns today.  PLAN:will follow up in 1 week.  Tomasa Rand, RN, BSN, CEN [facility] [employer] [phone]

## 2018-05-19 NOTE — Telephone Encounter (Signed)
-----   Message from Liliane Shi, [place] sent at 05/19/2018  2:59 PM EDT ----- I faxed my note to Dr. Gloriann Loan with [facility] - please make sure it was received. Thx Scott

## 2018-05-19 NOTE — Progress Notes (Signed)
Cardiology Office Note:    Date:  05/19/2018   ID:  [name], DOB 08/14/1941, MRN [id number]  PCP:  Judy Roger, MD  Cardiologist:  Judy Carnes, MD  Vascular surgeon: Judy Hinds, MD   Referring MD: [name], Judy Cornea, MD   Chief Complaint  Patient presents with  . Surgical Clearance    History of Present Illness:    [name] is a 77 y.o. female with coronary artery disease status post CABG in 2004 and subsequent drug-eluting stent to the left main in 9518, diastolic CHF, recurrent DVT, hypertension, carotid artery disease, COPD, prior history of GI bleeding prompting discontinuation of Coumadin anticoagulation.  Nuclear stress test in September 2018 demonstrated a small region of distal anterior and apical ischemia and small anteroseptal defect consistent with probable soft tissue attenuation.  This was reviewed by [name] and felt to be low risk.  She was last seen in November 2018.  She was admitted in 02/2018 with multiple subacute pulmonary emboli on chest CT and large acute DVT in the left femoral vein as well as ischemic colitis.  She was noted to have SMA stenosis and underwent stenting with vascular surgery.  Her hospitalization was complicated by acute blood loss anemia secondary to large anterior abdominal wall hematoma in the setting of IV heparin and antiplatelet therapy requiring transfusion with PRBC x2 as well as acute kidney injury with peak creatinine 3.2.  She underwent placement of retrievable IVC filter.  She also required paracentesis secondary to ascites felt to be inflammatory.  CT scan did demonstrate cirrhosis.  She was discharged to skilled nursing.  She was readmitted in April 2019 with urinary tract infection and hematuria.  Of note, she did require IV Lasix for volume excess.  Echocardiogram demonstrated normal LV function.  She was seen by [name] in May 2019.  Follow-up ultrasound demonstrated possible narrowing of SMA stent.  However this was a  suboptimal study and the patient was asymptomatic.  She is currently on aspirin only.  Our office has been contacted by [name] for cardiac clearance.  She needs transurethral resection of a bladder tumor with [name].  [name] returns for surgical clearance.  She is here with her son.  She arrives in a wheelchair.  She is still having significant amounts of abdominal pain related to the hematoma.  This is slowly improving.  However, she has been fairly sedentary.  She does note occasional chest discomfort with emotional stress.  She feels that she may be slightly more short of breath than previous.  She has been awakening in the middle of the night out of breath.  Her weight is down since coming home from the hospital.  Her leg swelling has also improved.  Prior CV studies:   The following studies were reviewed today:  Mesenteric artery ultrasound 04/22/2018 FINAL INTERPRETATION: Mesenteric: 70 to 99% stenosis in the superior mesenteric artery, based on limited Visualization.  Echo 04/03/2018 EF 55-60, normal wall motion, MAC  Abdominal and chest CTA 04/01/2018 IMPRESSION: 1. No evidence of pulmonary embolism. Interval resolution of previously seen left lower lobe pulmonary emboli. 2. Unchanged moderate right and trace left pleural effusions with adjacent bibasilar atelectasis. 3. Relatively unchanged large anterior abdominal wall hematoma involving the space of Retzius, measuring 11.0 x 16.7 x 12.6 cm. 4.  Aortic atherosclerosis (ICD10-I70.0).  Echo 03/02/2018 EF 55-60, normal wall motion, trivial AI, MAC, RVSP 30  Echo 09/21/2017 Mild LVH, EF 84-16, grade 1 diastolic  dysfunction, mild AI, moderate to severe MAC, mild MR, mild BAE  Nuclear stress test 08/31/2017  Nuclear stress EF: 65%.  Small region of distal anterior and apical ischemia, cannot exclude shifting breast attenuation. . Small anteroseptal defect consistent with probable soft tissue attenuation  This is a  low risk study.  Carotid US 12/19/2015 Bilateral ICA 60-79, elevated R subclavian velocities Follow-up 1 year  Cardiac catheterization 12/18/2009 ([name]) LM ostial 90, D1 100 LAD proximal 100 OM 9 RCA diffuse disease RI proximal 100 LIMA-LAD patent SVG-OM1 patent SVG-RI/D1 100 PCI: 4 x 12 mm Xience DES to the LM  Past Medical History:  Diagnosis Date  . Anemia    bld. transfusion post lumbar surgery- 2012  . Anxiety   . Arthralgia    NOS  . Blood transfusion 2011   WITH BACK SURGERY  . CAD (coronary artery disease)    s/p CABG 2004; s/p DES to LM in 2010;  [place] 10/29/11: EF 50-55%, mild elevated filling pressures, no pulmonary HTN, LM 90% ISR, LAD and CFX occluded, S-RI occluded (old), S-OM3 ok and L-LAD ok, native nondominant RCA 95% -  med rx recommended ; Lexiscan Myoview 7/13 at [facility]: demonstrated "normal LV function, anterior attenuation and localized ischemia, inferior, basilar, mid section"  . Carotid artery occlusion    moderate  . CHF (congestive heart failure) ([place])   . Chronic diastolic heart failure (HCC)    Echo 9/10: EF 85-02%, grade 1 diastolic dysfunction  . COPD (chronic obstructive pulmonary disease) ([place])    Emphysema dxed by [name] in [place] based on PFTs per pt in 2006; placed on albuterol  . Depression    TAKES CELEXA  AND  (OFF- WELBUTRIN)  . DVT of lower extremity (deep venous thrombosis) (HCC)    recurrent. bilateral (2 episodes)  . Dyspnea   . Dysrhythmia    afib with cabg  . Exertional angina (HCC)    Treated with Isosorbide, Ranexa, amlodipine; intolerant to metoprolol  . GERD (gastroesophageal reflux disease)   . HLD (hyperlipidemia)   . Hypertension   . Hyperthyroidism   . Obesity (BMI 30-39.9) 2009   BMI 33  . Osteoarthritis   . Oxygen deficiency   . Pneumonia   . PVD (peripheral vascular disease) ([place])   . Sleep apnea 2012   USED CPAP THEN  STARTED USING SPIRIVA , Nov. 2013- last evaluation , changed from mask to aparatus  that is just for her nose..    Surgical Hx: The patient  has a past surgical history that includes Ankle surgery (2004); Groin mass open biopsy (2004); Neck surgery (12/08); Breast enhancement surgery; Total hip arthroplasty (2007); Coronary artery bypass graft (2004); Cardiac catheterization (11/2009); Coronary angioplasty with stent (11/2009); Joint replacement (2012 ); Joint replacement (2010 ); Reverse shoulder arthroplasty (02/26/2012); Eye surgery (2006); Back surgery (1986); Shoulder arthroscopy with subacromial decompression (Left, 02/10/2013); Esophagogastroduodenoscopy (N/A, 02/14/2017); Givens capsule study (N/A, 02/15/2017); Colonoscopy with propofol (N/A, 02/17/2017); VISCERAL ANGIOGRAPHY (N/A, 03/05/2018); PERIPHERAL VASCULAR INTERVENTION (Left, 03/05/2018); VISCERAL ANGIOGRAPHY (N/A, 03/08/2018); PERIPHERAL VASCULAR INTERVENTION (03/08/2018); IR Paracentesis (03/10/2018); IR IVC FILTER PLMT / S&I /IMG GUID/MOD SED (03/14/2018); and Cholecystectomy.   Current Medications: Current Meds  Medication Sig  . allopurinol (ZYLOPRIM) 100 MG tablet Take 100 mg by mouth daily.  Marland Kitchen aspirin EC 81 MG tablet Take 81 mg by mouth daily.  . citalopram (CELEXA) 20 MG tablet Take 30 mg by mouth daily.   . CVS D3 1000 units capsule Take 1,000 Units by mouth  daily.  . cyclobenzaprine (FLEXERIL) 10 MG tablet Take 10 mg by mouth 3 (three) times daily as needed for muscle spasms.   Marland Kitchen docusate sodium (COLACE) 100 MG capsule Take 100 mg by mouth daily.  . feeding supplement, ENSURE ENLIVE, (ENSURE ENLIVE) LIQD Take 237 mLs by mouth 3 (three) times daily between meals.  . ferrous sulfate 325 (65 FE) MG tablet Take 325 mg by mouth daily with breakfast.  . furosemide (LASIX) 20 MG tablet Take 20 mg by mouth as directed. PT STATES SHE IS TAKING 3 TABS IN THE AM AND 2 TABS IN THE PM ; THIS IS REPORTED BY PT AND HER SON  . INCRUSE ELLIPTA 62.5 MCG/INH AEPB Inhale 1 puff into the lungs 2 (two) times daily.   .  Ipratropium-Albuterol (COMBIVENT) 20-100 MCG/ACT AERS respimat Inhale 1 puff into the lungs every 6 (six) hours as needed for wheezing.   Marland Kitchen ipratropium-albuterol (DUONEB) 0.5-2.5 (3) MG/3ML SOLN Take 3 mLs by nebulization 4 (four) times daily.  . magnesium oxide (MAG-OX) 400 (241.3 Mg) MG tablet Take 1 tablet (400 mg total) by mouth 2 (two) times daily.  . nitroGLYCERIN (NITROSTAT) 0.4 MG SL tablet Place 1 tablet (0.4 mg total) under the tongue every 5 (five) minutes as needed for chest pain. For chest pain (Patient taking differently: Place 0.4 mg under the tongue every 5 (five) minutes as needed for chest pain. For chest pain)  . omeprazole (PRILOSEC) 40 MG capsule Take 40 mg by mouth 2 (two) times daily.   . Oxycodone HCl 10 MG TABS Take 1 tablet (10 mg total) by mouth every 6 (six) hours as needed.  . pilocarpine (PILOCAR) 1 % ophthalmic solution Place 1 drop into both eyes 2 (two) times daily.  . potassium chloride SA (K-DUR,KLOR-CON) 20 MEQ tablet Take 20 mEq by mouth daily.  . pramipexole (MIRAPEX) 0.25 MG tablet Take 0.25 mg by mouth at bedtime.  . roflumilast (DALIRESP) 500 MCG TABS tablet Take 500 mcg by mouth daily.   . traZODone (DESYREL) 50 MG tablet Take 150 mg by mouth at bedtime.      Allergies:   Zolpidem tartrate; Pitavastatin; Ropinirole; and Penicillins   Social History   Tobacco Use  . Smoking status: Former Smoker    Years: 45.00    Last attempt to quit: 12/22/2002    Years since quitting: 15.4  . Smokeless tobacco: Never Used  Substance Use Topics  . Alcohol use: No    Alcohol/week: 0.0 oz  . Drug use: No     Family Hx: The patient's family history includes COPD in her father; Cancer in her mother, sister, and unknown relative; Colitis in her unknown relative; Heart disease in her father, sister, and unknown relative; Lung cancer in her father; Ovarian cancer in her mother.  ROS:   Please see the history of present illness.    Review of Systems  Cardiovascular:  Positive for leg swelling.  Respiratory: Positive for shortness of breath.   Hematologic/Lymphatic: Bruises/bleeds easily.  Musculoskeletal: Positive for joint swelling and myalgias.  Genitourinary: Positive for hematuria.  Psychiatric/Behavioral: Positive for depression.   All other systems reviewed and are negative.   EKGs/Labs/Other Test Reviewed:    EKG:  EKG is  ordered today.  The ekg ordered today demonstrates normal sinus rhythm, heart rate 89, left axis deviation, QTC 479, QRS 120, similar to prior tracings  Recent Labs: 08/17/2017: TSH 10.230 09/14/2017: NT-Pro BNP 503 04/01/2018: ALT 18; B Natriuretic Peptide 194.0; Magnesium 1.8 04/05/2018:  BUN 8; Creatinine, Ser 0.84; Hemoglobin 8.4; Platelets 326; Potassium 3.4; Sodium 142   Recent Lipid Panel Lab Results  Component Value Date/Time   CHOL 103 02/06/2017 03:58 PM   CHOL 98 05/23/2014 08:57 AM   TRIG 68 02/06/2017 03:58 PM   TRIG 83 05/23/2014 08:57 AM   HDL 51 02/06/2017 03:58 PM   HDL 35 (L) 05/23/2014 08:57 AM   CHOLHDL 2.0 02/06/2017 03:58 PM   CHOLHDL 4 03/03/2013 11:36 AM   LDLCALC 38 02/06/2017 03:58 PM   LDLCALC 46 05/23/2014 08:57 AM    Physical Exam:    VS:  BP (!) 124/52   Pulse 88   Ht _0  (1.727 m)   Wt 160 lb (72.6 kg)   SpO2 98%   BMI 24.33 kg/m     Wt Readings from Last 3 Encounters:  05/19/18 160 lb (72.6 kg)  05/13/18 168 lb (76.2 kg)  04/22/18 195 lb (88.5 kg)     Physical Exam  Constitutional: She is oriented to person, place, and time. She appears well-developed and well-nourished. No distress.  HENT:  Head: Normocephalic and atraumatic.  Neck: Neck supple.  Cardiovascular: Normal rate, regular rhythm, S1 normal, S2 normal and normal heart sounds.  No murmur heard. Pulmonary/Chest: Effort normal. She has no rales.  Abdominal: Soft.  Musculoskeletal: She exhibits edema (trace bilat LE edema).  Neurological: She is alert and oriented to person, place, and time.  Skin: Skin is  warm and dry.    ASSESSMENT & PLAN:    Preoperative cardiovascular examination She needs resection of a bladder tumor.  Her perioperative risk of major cardiac event is elevated at 6.6% according to the Revised Cardiac Risk Index.  Her activity level is quite limited due to continued abdominal pain from the hematoma.  Her functional status is <4 METs.  However, she had a nuclear stress test in September 2018.  She did not experience any cardiac complications during her prolonged hospitalization with acute blood loss anemia.  I reviewed her case today with [name] by telephone.  At best, she is moderate, but not prohibitive, risk for cardiac complications.  She may proceed with her urologic procedure without any further cardiac testing.    Coronary artery disease involving native coronary artery of native heart without angina pectoris Status post CABG in 2004 and drug-eluting stent to the left main in 2010.  Stress testing in September 2018 was overall low risk.  She has occasional atypical chest pains.  She was previously on long-acting nitrates.  However, her blood pressure will not tolerate resumption of isosorbide.  Continue aspirin 81 mg.  She is no longer on statin therapy for unclear reasons.  This will be resumed.  Chronic diastolic heart failure (HCC) Volume status appears stable.  Continue current dose of furosemide.  Repeat BMET today.  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HCC) Status post prior stenting.  Continue follow-up with vascular surgery as planned.  History of pulmonary embolism She had a life-threatening bleed on IV heparin and antiplatelet therapy in the hospital.  She is now status post IVC filter.  Bilateral carotid artery disease, unspecified type ([place]) History of moderate bilateral carotid plaque.  She has not had follow-up Doppler since 2016.  -Arrange carotid US  Hyperlipidemia, unspecified hyperlipidemia type Start atorvastatin 20 mg daily.  Obtain follow-up lipids and  LFTs in 2 months.    Essential hypertension The patient's blood pressure is controlled on her current regimen.  Continue current therapy.   Total time spent  with patient today 45 minutes. This includes reviewing records, evaluating the patient and coordinating care. Face-to-face time >50%.   Dispo:  Return in about 2 months (around 07/19/2018) for Routine Follow Up, w/ [name], or [name], PA-C.   Medication Adjustments/Labs and Tests Ordered: Current medicines are reviewed at length with the patient today.  Concerns regarding medicines are outlined above.  Tests Ordered: Orders Placed This Encounter  Procedures  . Basic Metabolic Panel (BMET)  . Hepatic function panel  . Lipid Profile  . EKG 12-Lead   Medication Changes: Meds ordered this encounter  Medications  . atorvastatin (LIPITOR) 20 MG tablet    Sig: Take 1 tablet (20 mg total) by mouth daily.    Dispense:  90 tablet    Refill:  3    Signed, [name], PA-C  05/19/2018 2:50 PM    [facility] [address], [place], [place]  [address] Phone: [phone]; Fax: [phone]

## 2018-05-20 ENCOUNTER — Other Ambulatory Visit: Payer: Self-pay | Admitting: Urology

## 2018-05-20 ENCOUNTER — Ambulatory Visit: Payer: Self-pay

## 2018-05-20 DIAGNOSIS — I110 Hypertensive heart disease with heart failure: Secondary | ICD-10-CM | POA: Diagnosis not present

## 2018-05-20 DIAGNOSIS — I5032 Chronic diastolic (congestive) heart failure: Secondary | ICD-10-CM | POA: Diagnosis not present

## 2018-05-20 DIAGNOSIS — I2510 Atherosclerotic heart disease of native coronary artery without angina pectoris: Secondary | ICD-10-CM | POA: Diagnosis not present

## 2018-05-20 DIAGNOSIS — J449 Chronic obstructive pulmonary disease, unspecified: Secondary | ICD-10-CM | POA: Diagnosis not present

## 2018-05-20 DIAGNOSIS — I2699 Other pulmonary embolism without acute cor pulmonale: Secondary | ICD-10-CM | POA: Diagnosis not present

## 2018-05-20 DIAGNOSIS — I4891 Unspecified atrial fibrillation: Secondary | ICD-10-CM | POA: Diagnosis not present

## 2018-05-21 ENCOUNTER — Telehealth: Payer: Self-pay | Admitting: *Deleted

## 2018-05-21 ENCOUNTER — Other Ambulatory Visit: Payer: Self-pay | Admitting: Pharmacist

## 2018-05-21 ENCOUNTER — Ambulatory Visit: Payer: Self-pay | Admitting: Pharmacist

## 2018-05-21 DIAGNOSIS — I5032 Chronic diastolic (congestive) heart failure: Secondary | ICD-10-CM

## 2018-05-21 LAB — BASIC METABOLIC PANEL
BUN/Creatinine Ratio: 15 (ref 12–28)
BUN: 14 mg/dL (ref 8–27)
CO2: 27 mmol/L (ref 20–29)
Calcium: 9.5 mg/dL (ref 8.7–10.3)
Chloride: 99 mmol/L (ref 96–106)
Creatinine, Ser: 0.96 mg/dL (ref 0.57–1.00)
GFR calc Af Amer: 66 mL/min/{1.73_m2} (ref >59)
GFR calc non Af Amer: 57 mL/min/{1.73_m2} — ABNORMAL LOW (ref >59)
Glucose: 86 mg/dL (ref 65–99)
Potassium: 3.4 mmol/L — ABNORMAL LOW (ref 3.5–5.2)
Sodium: 141 mmol/L (ref 134–144)

## 2018-05-21 NOTE — Patient Outreach (Signed)
[facility]) Care Management  05/21/2018  [name] 1941-01-13 [id number]   Successful call to [name] today.  Patient reports that she received the medication compliance packs from [facility] and has been using it for a few days.  She states "I'm tickled to death with it."  She reports that it is going very well and is much easier than using bottles.  She denies any other medication questions or concerns.  I provided my phone number in case patient needs to reach out me in the future.    Plan: I will close [facility] pharmacy case at this time.  I am happy to assist in in the future as needed.   Ralene Bathe, PharmD, [place] [phone]

## 2018-05-21 NOTE — Telephone Encounter (Signed)
-----   Message from Liliane Shi, [place] sent at 05/21/2018  1:49 PM EDT ----- The kidney function (creatinine) is normal.  The potassium is low.   PLAN:  1. Increase potassium to 20 mEq Twice daily 2. Repeat BMET 1 week. [name], PA-C    05/21/2018 1:47 PM

## 2018-05-21 NOTE — Telephone Encounter (Signed)
DPR on file ok to s/w pt's daughter Jerrye Beavers. Jerrye Beavers has been notified of pt's lab results and findings by phone with verba understanding. Advised to increase K+ to 20 meq BID, bmet in 1 week, pt will have lab work 6/6 at the our [place] office as she will be there for a Carotid U/S same day. I will place the lab order today. Pt's daughter thanked me for the call.

## 2018-05-24 DIAGNOSIS — I4891 Unspecified atrial fibrillation: Secondary | ICD-10-CM | POA: Diagnosis not present

## 2018-05-24 DIAGNOSIS — I2699 Other pulmonary embolism without acute cor pulmonale: Secondary | ICD-10-CM | POA: Diagnosis not present

## 2018-05-24 DIAGNOSIS — I110 Hypertensive heart disease with heart failure: Secondary | ICD-10-CM | POA: Diagnosis not present

## 2018-05-24 DIAGNOSIS — I2510 Atherosclerotic heart disease of native coronary artery without angina pectoris: Secondary | ICD-10-CM | POA: Diagnosis not present

## 2018-05-24 DIAGNOSIS — I5032 Chronic diastolic (congestive) heart failure: Secondary | ICD-10-CM | POA: Diagnosis not present

## 2018-05-24 DIAGNOSIS — J449 Chronic obstructive pulmonary disease, unspecified: Secondary | ICD-10-CM | POA: Diagnosis not present

## 2018-05-25 ENCOUNTER — Other Ambulatory Visit: Payer: Self-pay

## 2018-05-25 NOTE — Patient Outreach (Signed)
Transition of care: Placed call to patient who answered and reports that she is doing well. Continues to have abdominal pain. Reports weight is going down and has no swelling at this time. Reports she has improved strength but still feels weak. Reports that she loves her new pill packs and that is working well for her. Reports she is scheduled for surgery in 1 week for a bladder tumor. Reports that she might have to stay over night.  Family continues to stay with patient around the clock. Patient denies any new problems or concerns today.  PLAN: will follow up in 1 week.  Tomasa Rand, RN, BSN, CEN [facility] [employer] [phone]

## 2018-05-26 DIAGNOSIS — I2699 Other pulmonary embolism without acute cor pulmonale: Secondary | ICD-10-CM | POA: Diagnosis not present

## 2018-05-26 DIAGNOSIS — J449 Chronic obstructive pulmonary disease, unspecified: Secondary | ICD-10-CM | POA: Diagnosis not present

## 2018-05-26 DIAGNOSIS — I2510 Atherosclerotic heart disease of native coronary artery without angina pectoris: Secondary | ICD-10-CM | POA: Diagnosis not present

## 2018-05-26 DIAGNOSIS — I110 Hypertensive heart disease with heart failure: Secondary | ICD-10-CM | POA: Diagnosis not present

## 2018-05-26 DIAGNOSIS — I5032 Chronic diastolic (congestive) heart failure: Secondary | ICD-10-CM | POA: Diagnosis not present

## 2018-05-26 DIAGNOSIS — I4891 Unspecified atrial fibrillation: Secondary | ICD-10-CM | POA: Diagnosis not present

## 2018-05-27 ENCOUNTER — Ambulatory Visit (HOSPITAL_COMMUNITY)
Admission: RE | Admit: 2018-05-27 | Discharge: 2018-05-27 | Disposition: A | Discharge status: Home / Self Care | Payer: Medicare HMO | Source: Ambulatory Visit | Attending: Cardiology | Admitting: Cardiology

## 2018-05-27 DIAGNOSIS — J449 Chronic obstructive pulmonary disease, unspecified: Secondary | ICD-10-CM | POA: Insufficient documentation

## 2018-05-27 DIAGNOSIS — I2510 Atherosclerotic heart disease of native coronary artery without angina pectoris: Secondary | ICD-10-CM | POA: Diagnosis not present

## 2018-05-27 DIAGNOSIS — E785 Hyperlipidemia, unspecified: Secondary | ICD-10-CM | POA: Diagnosis not present

## 2018-05-27 DIAGNOSIS — I10 Essential (primary) hypertension: Secondary | ICD-10-CM | POA: Diagnosis not present

## 2018-05-27 DIAGNOSIS — K219 Gastro-esophageal reflux disease without esophagitis: Secondary | ICD-10-CM | POA: Diagnosis not present

## 2018-05-27 DIAGNOSIS — I779 Disorder of arteries and arterioles, unspecified: Secondary | ICD-10-CM | POA: Insufficient documentation

## 2018-05-27 DIAGNOSIS — Z8719 Personal history of other diseases of the digestive system: Secondary | ICD-10-CM | POA: Diagnosis not present

## 2018-05-27 DIAGNOSIS — I739 Peripheral vascular disease, unspecified: Secondary | ICD-10-CM

## 2018-05-27 DIAGNOSIS — R188 Other ascites: Secondary | ICD-10-CM | POA: Diagnosis not present

## 2018-05-27 DIAGNOSIS — K7460 Unspecified cirrhosis of liver: Secondary | ICD-10-CM | POA: Diagnosis not present

## 2018-05-27 NOTE — Patient Instructions (Signed)
[name]  05/27/2018   Your procedure is scheduled on: 05-31-18   Report to [facility] Main  Entrance    Report to admitting at 11:00AM    Call this number if you have problems the morning of surgery [phone]   PLEASE [place]. DEVICE WILL BE PROVIDED!   Remember: Do not eat food After Midnight. YOU MAY HAVE CLEAR LIQUIDS UNTIL 7:00    Take these medicines the morning of surgery with A SIP OF WATER: ALLOPURINOL, ATORVASTATIN, CITALOPRAM, DALIRESP, OMEPRAZOLE, ELLIPTA  AND COMBIVENT INHALER (PLEASE BRING)                                 You may not have any metal on your body including hair pins and              piercings  Do not wear jewelry, make-up, lotions, powders or perfumes, deodorant             Do not wear nail polish.  Do not shave  48 hours prior to surgery.     Do not bring valuables to the hospital. [place].  Contacts, dentures or bridgework may not be worn into surgery.      Patients discharged the day of surgery will not be allowed to drive home.  Name and phone number of your driver:  Special Instructions: N/A              Please read over the following fact sheets you were given: _____________________________________________________________________     CLEAR LIQUID DIET   Foods Allowed                                                                     Foods Excluded  Coffee and tea, regular and decaf                             liquids that you cannot  Plain Jell-O in any flavor                                             see through such as: Fruit ices (not with fruit pulp)                                     milk, soups, orange juice  Iced Popsicles                                    All solid food Carbonated beverages, regular and diet  Cranberry, grape and apple juices Sports drinks like  Gatorade Lightly seasoned clear broth or consume(fat free) Sugar, honey syrup  Sample Menu Breakfast                                Lunch                                     Supper Cranberry juice                    Beef broth                            Chicken broth Jell-O                                     Grape juice                           Apple juice Coffee or tea                        Jell-O                                      Popsicle                                                Coffee or tea                        Coffee or tea  _____________________________________________________________________  [facility] - Preparing for Surgery Before surgery, you can play an important role.  Because skin is not sterile, your skin needs to be as free of germs as possible.  You can reduce the number of germs on your skin by washing with CHG (chlorahexidine gluconate) soap before surgery.  CHG is an antiseptic cleaner which kills germs and bonds with the skin to continue killing germs even after washing. Please DO NOT use if you have an allergy to CHG or antibacterial soaps.  If your skin becomes reddened/irritated stop using the CHG and inform your nurse when you arrive at Short Stay. Do not shave (including legs and underarms) for at least 48 hours prior to the first CHG shower.  You may shave your face/neck. Please follow these instructions carefully:  1.  Shower with CHG Soap the night before surgery and the  morning of Surgery.  2.  If you choose to wash your hair, wash your hair first as usual with your  normal  shampoo.  3.  After you shampoo, rinse your hair and body thoroughly to remove the  shampoo.                           4.  Use CHG as you would any other liquid soap.  You can apply chg directly  to the skin and wash  Gently with a scrungie or clean washcloth.  5.  Apply the CHG Soap to your body ONLY FROM THE NECK DOWN.   Do not use on face/ open                            Wound or open sores. Avoid contact with eyes, ears mouth and genitals (private parts).                       Wash face,  Genitals (private parts) with your normal soap.             6.  Wash thoroughly, paying special attention to the area where your surgery  will be performed.  7.  Thoroughly rinse your body with warm water from the neck down.  8.  DO NOT shower/wash with your normal soap after using and rinsing off  the CHG Soap.                9.  Pat yourself dry with a clean towel.            10.  Wear clean pajamas.            11.  Place clean sheets on your bed the night of your first shower and do not  sleep with pets. Day of Surgery : Do not apply any lotions/deodorants the morning of surgery.  Please wear clean clothes to the hospital/surgery center.  FAILURE TO FOLLOW THESE INSTRUCTIONS MAY RESULT IN THE CANCELLATION OF YOUR SURGERY PATIENT SIGNATURE_________________________________  NURSE SIGNATURE__________________________________  ________________________________________________________________________

## 2018-05-27 NOTE — Progress Notes (Signed)
[facility] CLEARANCE SCOTT WEAVER 05-19-18 Epic   EKG 05-19-18 Epic   BMP 05-19-18 Epic   ECHO 04-03-18 Epic   CT CHEST 04-01-18 Epic   STRESS TEST 08-31-17 Epic

## 2018-05-28 ENCOUNTER — Other Ambulatory Visit: Payer: Self-pay

## 2018-05-28 ENCOUNTER — Telehealth: Payer: Self-pay | Admitting: *Deleted

## 2018-05-28 ENCOUNTER — Encounter (HOSPITAL_COMMUNITY)
Admission: RE | Admit: 2018-05-28 | Discharge: 2018-05-28 | Disposition: A | Discharge status: Home / Self Care | Payer: Medicare HMO | Source: Ambulatory Visit | Attending: Urology | Admitting: Urology

## 2018-05-28 ENCOUNTER — Encounter: Payer: Self-pay | Admitting: Physician Assistant

## 2018-05-28 ENCOUNTER — Other Ambulatory Visit (HOSPITAL_COMMUNITY): Payer: Medicare HMO

## 2018-05-28 DIAGNOSIS — D509 Iron deficiency anemia, unspecified: Secondary | ICD-10-CM | POA: Diagnosis not present

## 2018-05-28 DIAGNOSIS — I739 Peripheral vascular disease, unspecified: Principal | ICD-10-CM

## 2018-05-28 DIAGNOSIS — E039 Hypothyroidism, unspecified: Secondary | ICD-10-CM | POA: Diagnosis not present

## 2018-05-28 DIAGNOSIS — I110 Hypertensive heart disease with heart failure: Secondary | ICD-10-CM | POA: Diagnosis not present

## 2018-05-28 DIAGNOSIS — I509 Heart failure, unspecified: Secondary | ICD-10-CM | POA: Diagnosis not present

## 2018-05-28 DIAGNOSIS — R310 Gross hematuria: Secondary | ICD-10-CM | POA: Diagnosis not present

## 2018-05-28 DIAGNOSIS — J449 Chronic obstructive pulmonary disease, unspecified: Secondary | ICD-10-CM | POA: Diagnosis not present

## 2018-05-28 DIAGNOSIS — Z79899 Other long term (current) drug therapy: Secondary | ICD-10-CM | POA: Diagnosis not present

## 2018-05-28 DIAGNOSIS — Z86711 Personal history of pulmonary embolism: Secondary | ICD-10-CM | POA: Diagnosis not present

## 2018-05-28 DIAGNOSIS — I2510 Atherosclerotic heart disease of native coronary artery without angina pectoris: Secondary | ICD-10-CM | POA: Diagnosis not present

## 2018-05-28 DIAGNOSIS — R319 Hematuria, unspecified: Secondary | ICD-10-CM | POA: Diagnosis present

## 2018-05-28 DIAGNOSIS — G4733 Obstructive sleep apnea (adult) (pediatric): Secondary | ICD-10-CM | POA: Diagnosis not present

## 2018-05-28 DIAGNOSIS — Z7982 Long term (current) use of aspirin: Secondary | ICD-10-CM | POA: Diagnosis not present

## 2018-05-28 DIAGNOSIS — I779 Disorder of arteries and arterioles, unspecified: Secondary | ICD-10-CM

## 2018-05-28 HISTORY — DX: Disorder of arteries and arterioles, unspecified: I77.9

## 2018-05-28 HISTORY — DX: Dependence on supplemental oxygen: Z99.81

## 2018-05-28 HISTORY — DX: Peripheral vascular disease, unspecified: I73.9

## 2018-05-28 LAB — CBC
HCT: 37 % (ref 36.0–46.0)
Hemoglobin: 11.4 g/dL — ABNORMAL LOW (ref 12.0–15.0)
MCH: 27.2 pg (ref 26.0–34.0)
MCHC: 30.8 g/dL (ref 30.0–36.0)
MCV: 88.3 fL (ref 78.0–100.0)
Platelets: 324 10*3/uL (ref 150–400)
RBC: 4.19 MIL/uL (ref 3.87–5.11)
RDW: 16 % — ABNORMAL HIGH (ref 11.5–15.5)
WBC: 9.7 10*3/uL (ref 4.0–10.5)

## 2018-05-28 NOTE — Telephone Encounter (Signed)
-----   Message from Liliane Shi, [place] sent at 05/28/2018 12:36 PM EDT ----- Please call the patient. The carotid ultrasound shows mild to moderate narrowing in the arteries in the neck (1-39% right internal carotid artery and 40-59% left internal carotid artery).  This is somewhat improved since 2016.  There is plaque noted in the subclavian arteries that is stable (blood vessel under the collarbone).   Continue current medications and follow up as planned. Repeat in 1 year.  Please fax a copy to PCP:  [name], MD  [name], PA-C    05/28/2018 12:30 PM

## 2018-05-28 NOTE — Telephone Encounter (Signed)
Left message to go over carotid results.  

## 2018-05-31 ENCOUNTER — Encounter (HOSPITAL_COMMUNITY): Payer: Self-pay | Admitting: Emergency Medicine

## 2018-05-31 ENCOUNTER — Ambulatory Visit (HOSPITAL_COMMUNITY)
Admission: RE | Admit: 2018-05-31 | Discharge: 2018-05-31 | Disposition: A | Discharge status: Home / Self Care | Payer: Medicare HMO | Source: Ambulatory Visit | Attending: Urology | Admitting: Urology

## 2018-05-31 ENCOUNTER — Encounter (HOSPITAL_COMMUNITY)
Admission: RE | Disposition: A | Discharge status: Home / Self Care | Payer: Self-pay | Source: Ambulatory Visit | Attending: Urology

## 2018-05-31 ENCOUNTER — Ambulatory Visit (HOSPITAL_COMMUNITY): Payer: Medicare HMO | Admitting: Certified Registered"

## 2018-05-31 DIAGNOSIS — I2510 Atherosclerotic heart disease of native coronary artery without angina pectoris: Secondary | ICD-10-CM | POA: Insufficient documentation

## 2018-05-31 DIAGNOSIS — Z7982 Long term (current) use of aspirin: Secondary | ICD-10-CM | POA: Insufficient documentation

## 2018-05-31 DIAGNOSIS — Z86711 Personal history of pulmonary embolism: Secondary | ICD-10-CM | POA: Insufficient documentation

## 2018-05-31 DIAGNOSIS — N3021 Other chronic cystitis with hematuria: Secondary | ICD-10-CM | POA: Diagnosis not present

## 2018-05-31 DIAGNOSIS — G4733 Obstructive sleep apnea (adult) (pediatric): Secondary | ICD-10-CM | POA: Diagnosis not present

## 2018-05-31 DIAGNOSIS — E039 Hypothyroidism, unspecified: Secondary | ICD-10-CM | POA: Insufficient documentation

## 2018-05-31 DIAGNOSIS — I739 Peripheral vascular disease, unspecified: Secondary | ICD-10-CM | POA: Insufficient documentation

## 2018-05-31 DIAGNOSIS — I10 Essential (primary) hypertension: Secondary | ICD-10-CM | POA: Diagnosis not present

## 2018-05-31 DIAGNOSIS — R319 Hematuria, unspecified: Secondary | ICD-10-CM | POA: Diagnosis not present

## 2018-05-31 DIAGNOSIS — J449 Chronic obstructive pulmonary disease, unspecified: Secondary | ICD-10-CM | POA: Insufficient documentation

## 2018-05-31 DIAGNOSIS — I509 Heart failure, unspecified: Secondary | ICD-10-CM | POA: Insufficient documentation

## 2018-05-31 DIAGNOSIS — Z79899 Other long term (current) drug therapy: Secondary | ICD-10-CM | POA: Insufficient documentation

## 2018-05-31 DIAGNOSIS — I110 Hypertensive heart disease with heart failure: Secondary | ICD-10-CM | POA: Insufficient documentation

## 2018-05-31 DIAGNOSIS — R310 Gross hematuria: Secondary | ICD-10-CM | POA: Diagnosis not present

## 2018-05-31 DIAGNOSIS — D509 Iron deficiency anemia, unspecified: Secondary | ICD-10-CM | POA: Diagnosis not present

## 2018-05-31 HISTORY — PX: TRANSURETHRAL RESECTION OF BLADDER TUMOR: SHX2575

## 2018-05-31 SURGERY — TURBT (TRANSURETHRAL RESECTION OF BLADDER TUMOR)
Anesthesia: General

## 2018-05-31 MED ORDER — FENTANYL CITRATE (PF) 100 MCG/2ML IJ SOLN
INTRAMUSCULAR | Status: AC
  Administered 2018-05-31: 25 ug via INTRAVENOUS
  Filled 2018-05-31: qty 2

## 2018-05-31 MED ORDER — CIPROFLOXACIN IN D5W 400 MG/200ML IV SOLN
400.0000 mg | INTRAVENOUS | Status: AC
  Administered 2018-05-31: 400 mg via INTRAVENOUS
  Filled 2018-05-31: qty 200

## 2018-05-31 MED ORDER — HYDROCODONE-ACETAMINOPHEN 5-325 MG PO TABS: 1.0000 | ORAL_TABLET | ORAL | 0 refills | Status: DC | PRN

## 2018-05-31 MED ORDER — LACTATED RINGERS IV SOLN
INTRAVENOUS | Status: DC
  Administered 2018-05-31: 12:00:00 via INTRAVENOUS

## 2018-05-31 MED ORDER — SODIUM CHLORIDE 0.9 % IR SOLN
Status: DC | PRN
  Administered 2018-05-31: 6000 mL via INTRAVESICAL

## 2018-05-31 MED ORDER — FENTANYL CITRATE (PF) 100 MCG/2ML IJ SOLN
25.0000 ug | INTRAMUSCULAR | Status: DC | PRN
Start: 2018-05-31 — End: 2018-05-31
  Administered 2018-05-31 (×3): 25 ug via INTRAVENOUS

## 2018-05-31 MED ORDER — LIDOCAINE 2% (20 MG/ML) 5 ML SYRINGE
INTRAMUSCULAR | Status: AC
  Filled 2018-05-31: qty 5

## 2018-05-31 MED ORDER — LIDOCAINE 2% (20 MG/ML) 5 ML SYRINGE
INTRAMUSCULAR | Status: DC | PRN
  Administered 2018-05-31: 50 mg via INTRAVENOUS

## 2018-05-31 MED ORDER — PROPOFOL 10 MG/ML IV BOLUS
INTRAVENOUS | Status: DC | PRN
  Administered 2018-05-31: 80 mg via INTRAVENOUS

## 2018-05-31 MED ORDER — MEPERIDINE HCL 50 MG/ML IJ SOLN: 6.2500 mg | INTRAMUSCULAR | Status: DC | PRN

## 2018-05-31 MED ORDER — DEXAMETHASONE SODIUM PHOSPHATE 10 MG/ML IJ SOLN
INTRAMUSCULAR | Status: DC | PRN
  Administered 2018-05-31: 4 mg via INTRAVENOUS

## 2018-05-31 MED ORDER — PHENYLEPHRINE 40 MCG/ML (10ML) SYRINGE FOR IV PUSH (FOR BLOOD PRESSURE SUPPORT)
PREFILLED_SYRINGE | INTRAVENOUS | Status: DC | PRN
  Administered 2018-05-31 (×2): 80 ug via INTRAVENOUS

## 2018-05-31 MED ORDER — SUCCINYLCHOLINE CHLORIDE 200 MG/10ML IV SOSY
PREFILLED_SYRINGE | INTRAVENOUS | Status: AC
  Filled 2018-05-31: qty 10

## 2018-05-31 MED ORDER — FENTANYL CITRATE (PF) 100 MCG/2ML IJ SOLN
INTRAMUSCULAR | Status: DC | PRN
  Administered 2018-05-31: 25 ug via INTRAVENOUS
  Administered 2018-05-31: 50 ug via INTRAVENOUS

## 2018-05-31 MED ORDER — PROPOFOL 10 MG/ML IV BOLUS
INTRAVENOUS | Status: AC
  Filled 2018-05-31: qty 20

## 2018-05-31 MED ORDER — FENTANYL CITRATE (PF) 100 MCG/2ML IJ SOLN
INTRAMUSCULAR | Status: AC
  Filled 2018-05-31: qty 2

## 2018-05-31 MED ORDER — PHENYLEPHRINE 40 MCG/ML (10ML) SYRINGE FOR IV PUSH (FOR BLOOD PRESSURE SUPPORT)
PREFILLED_SYRINGE | INTRAVENOUS | Status: AC
  Filled 2018-05-31: qty 10

## 2018-05-31 MED ORDER — SUCCINYLCHOLINE CHLORIDE 200 MG/10ML IV SOSY
PREFILLED_SYRINGE | INTRAVENOUS | Status: DC | PRN
  Administered 2018-05-31: 80 mg via INTRAVENOUS

## 2018-05-31 SURGICAL SUPPLY — 14 items
BAG URINE DRAINAGE (UROLOGICAL SUPPLIES) IMPLANT
BAG URO CATCHER STRL LF (MISCELLANEOUS) ×3 IMPLANT
COVER FOOTSWITCH UNIV (MISCELLANEOUS) ×2 IMPLANT
ELECT REM PT RETURN 15FT ADLT (MISCELLANEOUS) ×3 IMPLANT
GLOVE BIOGEL M STRL SZ7.5 (GLOVE) ×3 IMPLANT
GOWN STRL REUS W/TWL LRG LVL3 (GOWN DISPOSABLE) ×3 IMPLANT
LOOP CUT BIPOLAR 24F LRG (ELECTROSURGICAL) ×2 IMPLANT
MANIFOLD NEPTUNE II (INSTRUMENTS) ×3 IMPLANT
PACK CYSTO (CUSTOM PROCEDURE TRAY) ×3 IMPLANT
SET ASPIRATION TUBING (TUBING) ×2 IMPLANT
SYRINGE IRR TOOMEY STRL 70CC (SYRINGE) IMPLANT
TUBING CONNECTING 10 (TUBING) ×2 IMPLANT
TUBING CONNECTING 10' (TUBING) ×1
TUBING UROLOGY SET (TUBING) ×3 IMPLANT

## 2018-05-31 NOTE — H&P (Signed)
CC/HPI: CC: Hematuria  HPI:   04/20/18  77 year old female with multiple medical comorbidities including recurrent DVT on chronic Coumadin, hypertension, COPD, carotid stenosis, history of upper GI bleed, CHF. She recently had a prolonged hospitalization for close to a month due to right sided ischemic colitis, acute hypoxic respiratory failure, and she had an IVC filter placed on 03/14/2018. She was found to have a large abdominal wall hematoma about 17 cm. She recently had a repeat CT with contrast that revealed a stable in size hematoma. Kidneys had simple cyst but no calculi or hydronephrosis. The bladder was unremarkable but it does appear that the hematoma is causing some mass effect on the bladder. She does have an increase in voiding complaints including frequency, urgency, nocturia, urinary incontinence and I believe this increase in symptoms is likely due to the mass effect on the bladder from the hematoma. Her hematuria continues to improve. His very light red today and she can see through it. She denies previous gross hematuria.   04/30/18  Patient continues to have mild hematuria but it is very light pink. She recently got over a stomach bug. She presents today for cystoscopy.     ALLERGIES: Penicillin Pitavastatin Ropinirole Zolpidem Tartrate    MEDICATIONS: Allopurinol 100 mg tablet  Bactrim Ds 800 mg-160 mg tablet 1 tablet PO BID  Omeprazole 40 mg capsule,delayed release  Aspir 81  Citalopram Hbr  Combivent Respimat 20 mcg-100 mcg/actuation mist inhaler  Daliresp 500 mcg tablet  Ferrous Sulfate  Incruse Ellipta 62.5 mcg/actuation blister, with inhalation device  Lasix 20 mg tablet tablet  Magnesium Oxide 400  Nitroglycerin 0.4 mg tablet, sublingual  Oxycodone Hcl 10 mg tablet  Oxygen  Potassium Chloride 20 meq tablet, extended release  Pramipexole Er  Trazodone Hcl 150 mg tablet  Vitamin D3     GU PSH: No GU PSH    NON-GU PSH: No Non-GU PSH    GU PMH: Gross  hematuria - 04/20/2018    NON-GU PMH: Bipolar disorder, unspecified COPD GERD Heart failure, unspecified Hypercholesterolemia Hypertension Hypothyroidism Insomnia, unspecified Iron deficiency anemia, unspecified Pulmonary Embolism, History Sleep Apnea    FAMILY HISTORY: No Family History    SOCIAL HISTORY: Marital Status: Widowed    REVIEW OF SYSTEMS:    GU Review Female:   Patient reports hard to postpone urination, burning /pain with urination, and get up at night to urinate. Patient denies frequent urination, leakage of urine, stream starts and stops, trouble starting your stream, have to strain to urinate, and being pregnant.  Gastrointestinal (Upper):   Patient reports nausea and vomiting. Patient denies indigestion/ heartburn.  Gastrointestinal (Lower):   Patient reports diarrhea. Patient denies constipation.  Constitutional:   Patient reports weight loss and fatigue. Patient denies fever and night sweats.  Skin:   Patient reports itching. Patient denies skin rash/ lesion.  Eyes:   Patient denies blurred vision and double vision.  Ears/ Nose/ Throat:   Patient denies sore throat and sinus problems.  Hematologic/Lymphatic:   Patient reports easy bruising. Patient denies swollen glands.  Cardiovascular:   Patient reports leg swelling. Patient denies chest pains.  Respiratory:   Patient reports shortness of breath. Patient denies cough.  Endocrine:   Patient denies excessive thirst.  Musculoskeletal:   Patient reports back pain. Patient denies joint pain.  Neurological:   Patient reports headaches. Patient denies dizziness.  Psychologic:   Patient reports depression and anxiety.    VITAL SIGNS:      04/30/2018 10:50 AM  Weight 165 lb / 74.84 kg  Height 68 in / 172.72 cm  BP 107/64 mmHg  Pulse 91 /min  Temperature 98.7 F / 37.0 C  BMI 25.1 kg/m   MULTI-SYSTEM PHYSICAL EXAMINATION:    Constitutional: Frail elderly female in a wheelchair on supplemental oxygen   Respiratory: No labored breathing, no use of accessory muscles. On supplemental oxygen  Cardiovascular: Normal temperature, adequate perfusion of extremities  Skin: No paleness, no jaundice  Neurologic / Psychiatric: Oriented to time, oriented to place, oriented to person. No depression, no anxiety, no agitation.  Gastrointestinal: No mass, no tenderness, no rigidity, non obese abdomen.  Eyes: Normal conjunctivae. Normal eyelids.  Musculoskeletal: In a wheelchair     PAST DATA REVIEWED:  Source Of History:  Patient   PROCEDURES:         Flexible Cystoscopy - 52000  Risks, benefits, and some of the potential complications of the procedure were discussed at length with the patient including infection, bleeding, voiding discomfort, urinary retention, fever, chills, sepsis, and others. All questions were answered. Informed consent was obtained. Antibiotic prophylaxis was given. Sterile technique and intraurethral analgesia were used.  Meatus:  Normal size. Normal location. Normal condition.  Urethra:  Normal urethra  Ureteral Orifices:  Unable to visualize  Bladder:  There was a great deal of debris as well as cloudy urine versus some old blood. Within limitation, I briefly saw a potential area of bladder tumor at the dome of the bladder. However, visualization was significantly hindered despite irrigation of the bladder.      The lower urinary tract was carefully examined. The procedure was well-tolerated and without complications. Antibiotic instructions were given. Instructions were given to call the office immediately for bloody urine, difficulty urinating, urinary retention, painful or frequent urination, fever, chills, nausea, vomiting or other illness. The patient stated that she understood these instructions and would comply with them.         Urinalysis - 81003 Dipstick Dipstick Cont'd Micro  Color: Amber Bilirubin: Neg WBC/hpf: 40 - 60/hpf  Appearance: Turbid Ketones: Neg RBC/hpf:  >60/hpf  Specific Gravity: 1.025 Blood: 3+ Bacteria: NS (Not Seen)  pH: 7.0 Protein: 3+ Cystals: NS (Not Seen)  Glucose: Neg Urobilinogen: 0.2 Casts: NS (Not Seen)    Nitrites: Neg Trichomonas: Not Present    Leukocyte Esterase: 3+ Mucous: Present      Epithelial Cells: 0 - 5/hpf      Yeast: NS (Not Seen)      Sperm: Not Present    Notes: Microscopic done on unconcentrated urine    ASSESSMENT:      ICD-10 Details  1 GU:   Gross hematuria - R31.0    PLAN:           Orders Labs Urine Culture, Urine Cytology          Document Letter(s):  Created for Patient: Clinical Summary         Notes:   Recommend full visualization in the operating room. Possible transurethral resection of bladder tumor if this is confirmed. She understands potential risks including but not limited to bleeding, infection, injury to surrounding structures including potential perforation of the bladder, need for additional procedures. She will need cardiology clearance as well as primary care clearance.   Cc: [name], M.D.  Verneita Griffes, M.D.  Dorris Carnes, M.D.    Signed by Link Snuffer, III, M.D. on 04/30/18 at 12:17 PM (EDT

## 2018-05-31 NOTE — Anesthesia Postprocedure Evaluation (Signed)
Anesthesia Post Note  Patient: [name]  Procedure(s) Performed: TRANSURETHRAL RESECTION OF BLADDER TUMOR (TURBT) (N/A )     Patient location during evaluation: PACU Anesthesia Type: General Level of consciousness: awake and alert Pain management: pain level controlled Vital Signs Assessment: post-procedure vital signs reviewed and stable Respiratory status: spontaneous breathing, nonlabored ventilation and respiratory function stable Cardiovascular status: blood pressure returned to baseline and stable Postop Assessment: no apparent nausea or vomiting Anesthetic complications: no    Last Vitals:  Vitals:   05/31/18 1730 05/31/18 1800  BP: 128/60 137/66  Pulse: 83 85  Resp: 20 19  Temp: 36.7 C 36.7 C  SpO2: 97% 96%    Last Pain:  Vitals:   05/31/18 1800  TempSrc:   PainSc: [place]

## 2018-05-31 NOTE — Progress Notes (Signed)
[name] verified that pt no longer takes isosorbide due to hypotension. Pharmacy tech notified.    [name], son also verified that pt stopped drinking this morning at 0700.   Pt appears to have mild confusion.

## 2018-05-31 NOTE — Anesthesia Procedure Notes (Signed)
Procedure Name: Intubation Date/Time: 05/31/2018 4:07 PM Performed by: Anne Fu, CRNA Pre-anesthesia Checklist: Patient identified, Emergency Drugs available, Suction available, Patient being monitored and Timeout performed Patient Re-evaluated:Patient Re-evaluated prior to induction Oxygen Delivery Method: Circle system utilized Preoxygenation: Pre-oxygenation with 100% oxygen Induction Type: IV induction Ventilation: Mask ventilation without difficulty Laryngoscope Size: Mac and 4 Grade View: Grade I Tube type: Oral Tube size: 7.5 mm Number of attempts: 1 Airway Equipment and Method: Stylet Placement Confirmation: ETT inserted through vocal cords under direct vision,  positive ETCO2 and breath sounds checked- equal and bilateral Secured at: 19 cm Tube secured with: Tape Dental Injury: Teeth and Oropharynx as per pre-operative assessment

## 2018-05-31 NOTE — Anesthesia Preprocedure Evaluation (Addendum)
Anesthesia Evaluation  Patient identified by MRN, date of birth, ID band Patient awake    Reviewed: Allergy & Precautions, H&P , NPO status , Patient's Chart, lab work & pertinent test results  Airway Mallampati: II  TM Distance: >3 FB Neck ROM: Full    Dental no notable dental hx. (+) Upper Dentures, Lower Dentures, Dental Advisory Given   Pulmonary sleep apnea , COPD,  COPD inhaler, former smoker,    Pulmonary exam normal breath sounds clear to auscultation       Cardiovascular hypertension, Pt. on medications + CAD, + Cardiac Stents, + CABG and + Peripheral Vascular Disease   Rhythm:Regular Rate:Normal  ECHO 4/19  Study Conclusions  - Left ventricle: The cavity size was normal. Wall thickness was   normal. Systolic function was normal. The estimated ejection   fraction was in the range of 55% to 60%. Wall motion was normal;   there were no regional wall motion abnormalities. - Aortic valve: There was trivial regurgitation. - Mitral valve: Calcified annulus. Mildly thickened leaflets . - Pulmonary arteries: Systolic pressure was mildly increased.  Lexiscan stress 9/18  Nuclear stress EF: 65%.  Small region of distal anterior and apical ischemia, cannot exclude shifting breast attenuation. . Small anteroseptal defect consistent with probable soft tissue attenuation  This is a low risk study.     Neuro/Psych PSYCHIATRIC DISORDERS Anxiety Depression Carotid  US 6/19 The carotid ultrasound shows mild to moderate narrowing in the arteries in the neck (1-39% right internal carotid artery and 40-59% left internal carotid artery).  This is somewhat improved since 2016.  There is plaque noted in the subclavian arteries that is stable (blood vessel under the collarbone).      GI/Hepatic GERD  Medicated and Controlled,  Endo/Other  Hypothyroidism Morbid obesity  Renal/GU      Musculoskeletal  (+) Arthritis ,  Osteoarthritis,    Abdominal   Peds  Hematology  (+) anemia ,   Anesthesia Other Findings   Reproductive/Obstetrics                        Anesthesia Physical  Anesthesia Plan  ASA: III  Anesthesia Plan: General   Post-op Pain Management:    Induction: Intravenous  PONV Risk Score and Plan: 3 and Ondansetron, Treatment may vary due to age or medical condition and Dexamethasone  Airway Management Planned: LMA and Oral ETT  Additional Equipment:   Intra-op Plan:   Post-operative Plan: Extubation in OR  Informed Consent: I have reviewed the patients History and Physical, chart, labs and discussed the procedure including the risks, benefits and alternatives for the proposed anesthesia with the patient or authorized representative who has indicated his/her understanding and acceptance.   Dental advisory given  Plan Discussed with: CRNA, Anesthesiologist and Surgeon  Anesthesia Plan Comments: (  )       Anesthesia Quick Evaluation

## 2018-05-31 NOTE — Op Note (Signed)
Operative Note  Preoperative diagnosis:  1.  Hematuria  Postoperative diagnosis: 1.  Hematuria  Procedure(s): 1.  Cystoscopy with bladder biopsy and fulguration  Surgeon: Link Snuffer, MD  Assistants: None  Anesthesia: General  Complications: None immediate  EBL: Minimal  Specimens: 1.  Bladder lesion  Drains/Catheters: 1.  None  Intraoperative findings: 1.  Normal urethra 2.  At the dome of the bladder was what appears to be a large diverticulum.  Within this was very friable mucosa.  I obtained very small tissue specimen from this mucosa and fulgurated some areas of active bleeding.  It took very little manipulation for the tissue within the diverticulum to bleed.  There was no obvious mass lesion throughout the bladder.  Indication: 77 year old female with multiple medical comorbidities recently found to have a large abdominal wall hematoma that was about 17 cm.  The bladder was unremarkable except for some mass-effect on the bladder from the hematoma.  In office cystoscopy for hematuria revealed difficult to visualize bladder mucosa and a possible mass.  Patient presents for the previously mentioned operation.  Description of procedure:  The patient was identified and consent was obtained.  The patient was taken to the operating room and placed in the supine position.  The patient was placed under general anesthesia.  Perioperative antibiotics were administered.  The patient was placed in dorsal lithotomy.  Patient was prepped and draped in a standard sterile fashion and a timeout was performed.  A 26 French resectoscope was advanced into the urethra and into the bladder with the visual obturator in place.  The bladder had some debris in it and this was evacuated.  There were no clots.  I exchanged for the working element.  I performed a complete cystoscopy with the findings noted above.  Within the diverticulum I scraped away a small amount of tissue and collected this for  specimen.  There was no discrete mass and therefore no resection was required.  The edges of the diverticulum had some bleeding therefore this was fulgurated.  Within the diverticulum the mucosa was extremely friable.  After there was no active bleeding, I drained the bladder and withdrew the scope.  The patient tolerated the procedure well and was stable postoperatively.  Plan: Follow-up for pathology review.  Very low suspicion for bladder malignancy.

## 2018-05-31 NOTE — Telephone Encounter (Signed)
Pt has been notified carotid results by phone with verbal understanding. Pt agreeable to repeat carotids in 1 yr. I will fax a copy of results to Dr. [name] (PCP). Pt thanked me for the call.

## 2018-05-31 NOTE — Discharge Instructions (Addendum)
Transurethral Resection of Bladder Tumor (TURBT) or Bladder Biopsy   Definition:  Transurethral Resection of the Bladder Tumor is a surgical procedure used to diagnose and remove tumors within the bladder. TURBT is the most common treatment for early stage bladder cancer.  General instructions:     Your recent bladder surgery requires very little post hospital care but some definite precautions.  Despite the fact that no skin incisions were used, the area around the bladder incisions are raw and covered with scabs to promote healing and prevent bleeding. Certain precautions are needed to insure that the scabs are not disturbed over the next 2-4 weeks while the healing proceeds.  General Anesthesia, Adult, Care After These instructions provide you with information about caring for yourself after your procedure. Your health care provider may also give you more specific instructions. Your treatment has been planned according to current medical practices, but problems sometimes occur. Call your health care provider if you have any problems or questions after your procedure. What can I expect after the procedure? After the procedure, it is common to have:  Vomiting.  A sore throat.  Mental slowness.  It is common to feel:  Nauseous.  Cold or shivery.  Sleepy.  Tired.  Sore or achy, even in parts of your body where you did not have surgery.  Follow these instructions at home: For at least 24 hours after the procedure:  Do not: ? Participate in activities where you could fall or become injured. ? Drive. ? Use heavy machinery. ? Drink alcohol. ? Take sleeping pills or medicines that cause drowsiness. ? Make important decisions or sign legal documents. ? Take care of children on your own.  Rest. Eating and drinking  If you vomit, drink water, juice, or soup when you can drink without vomiting.  Drink enough fluid to keep your urine clear or pale yellow.  Make sure you  have little or no nausea before eating solid foods.  Follow the diet recommended by your health care provider. General instructions  Have a responsible adult stay with you until you are awake and alert.  Return to your normal activities as told by your health care provider. Ask your health care provider what activities are safe for you.  Take over-the-counter and prescription medicines only as told by your health care provider.  If you smoke, do not smoke without supervision.  Keep all follow-up visits as told by your health care provider. This is important. Contact a health care provider if:  You continue to have nausea or vomiting at home, and medicines are not helpful.  You cannot drink fluids or start eating again.  You cannot urinate after 8-12 hours.  You develop a skin rash.  You have fever.  You have increasing redness at the site of your procedure. Get help right away if:  You have difficulty breathing.  You have chest pain.  You have unexpected bleeding.  You feel that you are having a life-threatening or urgent problem. This information is not intended to replace advice given to you by your health care provider. Make sure you discuss any questions you have with your health care provider. Document Released: 03/16/2001 Document Revised: 05/12/2016 Document Reviewed: 11/22/2015 Elsevier Interactive Patient Education  [employer].  Because the raw surface inside your bladder and the irritating effects of urine you may expect frequency of urination and/or urgency (a stronger desire to urinate) and perhaps even getting up at night more often. This will usually resolve  or improve slowly over the healing period. You may see some blood in your urine over the first 6 weeks. Do not be alarmed, even if the urine was clear for a while. Get off your feet and drink lots of fluids until clearing occurs. If you start to pass clots or don't improve call us.  Diet:  You may  return to your normal diet immediately. Because of the raw surface of your bladder, alcohol, spicy foods, foods high in acid and drinks with caffeine may cause irritation or frequency and should be used in moderation. To keep your urine flowing freely and avoid constipation, drink plenty of fluids during the day (8-10 glasses). Tip: Avoid cranberry juice because it is very acidic.  Activity:  Your physical activity doesn't need to be restricted. However, if you are very active, you may see some blood in the urine. We suggest that you reduce your activity under the circumstances until the bleeding has stopped.  Bowels:  It is important to keep your bowels regular during the postoperative period. Straining with bowel movements can cause bleeding. A bowel movement every other day is reasonable. Use a mild laxative if needed, such as milk of magnesia 2-3 tablespoons, or 2 Dulcolax tablets. Call if you continue to have problems. If you had been taking narcotics for pain, before, during or after your surgery, you may be constipated. Take a laxative if necessary.    Medication:  You should resume your pre-surgery medications unless told not to. In addition you may be given an antibiotic to prevent or treat infection. Antibiotics are not always necessary. All medication should be taken as prescribed until the bottles are finished unless you are having an unusual reaction to one of the drugs.

## 2018-05-31 NOTE — Telephone Encounter (Signed)
-----   Message from Liliane Shi, [place] sent at 05/28/2018 12:36 PM EDT ----- Please call the patient. The carotid ultrasound shows mild to moderate narrowing in the arteries in the neck (1-39% right internal carotid artery and 40-59% left internal carotid artery).  This is somewhat improved since 2016.  There is plaque noted in the subclavian arteries that is stable (blood vessel under the collarbone).   Continue current medications and follow up as planned. Repeat in 1 year.  Please fax a copy to PCP:  [name], MD  [name], PA-C    05/28/2018 12:30 PM

## 2018-05-31 NOTE — Progress Notes (Signed)
Dr. Ambrose Pancoast notified that pt had chest pain 4 days ago and took 1 nitroglycerin with relief.  No new orders at this time. Pharmacy tech notified to review medications and add isosorbide to med list. Pt did not know what dosage she takes.

## 2018-05-31 NOTE — Interval H&P Note (Signed)
History and Physical Interval Note:  05/31/2018 3:46 PM  [name]  has presented today for surgery, with the diagnosis of HEMATURIA  The various methods of treatment have been discussed with the patient and family. After consideration of risks, benefits and other options for treatment, the patient has consented to  Procedure(s): TRANSURETHRAL RESECTION OF BLADDER TUMOR (TURBT) (N/A) as a surgical intervention .  The patient's history has been reviewed, patient examined, no change in status, stable for surgery.  I have reviewed the patient's chart and labs.  Questions were answered to the patient's satisfaction.     Marton Redwood, III

## 2018-05-31 NOTE — Transfer of Care (Signed)
Immediate Anesthesia Transfer of Care Note  Patient: [name]  Procedure(s) Performed: Procedure(s): TRANSURETHRAL RESECTION OF BLADDER TUMOR (TURBT) (N/A)  Patient Location: PACU  Anesthesia Type:General  Level of Consciousness:  sedated, patient cooperative and responds to stimulation  Airway & Oxygen Therapy:Patient Spontanous Breathing and Patient connected to face mask oxgen  Post-op Assessment:  Report given to PACU RN and Post -op Vital signs reviewed and stable  Post vital signs:  Reviewed and stable  Last Vitals:  Vitals:   05/31/18 1100  BP: (!) 151/71  Pulse: 85  Resp: 18  Temp: 36.9 C  SpO2: 62%    Complications: No apparent anesthesia complications

## 2018-06-01 ENCOUNTER — Encounter (HOSPITAL_COMMUNITY): Payer: Self-pay | Admitting: Urology

## 2018-06-02 ENCOUNTER — Other Ambulatory Visit: Payer: Self-pay

## 2018-06-02 NOTE — Patient Outreach (Signed)
Transition of care:  Placed call to patient for follow up transition of care.  Patient reports that she had her procedure for a bladder tumor vs mass and every thing went well. Denies any recent bleeding.  Reports decreased swelling. Reports she is feeling well and she is discontinuing her therapy.  Denies any new problems or concerns.   Plan:continue transition of care calls weekly.  Tomasa Rand, RN, BSN, CEN [facility] [employer] [phone]

## 2018-06-03 ENCOUNTER — Telehealth: Payer: Self-pay | Admitting: *Deleted

## 2018-06-03 NOTE — Telephone Encounter (Signed)
-----   Message from Liliane Shi, [place] sent at 05/31/2018  1:27 PM EDT ----- BMET was not done last week when patient had carotid ultrasound. Can we check on getting her BMET repeated? Thanks [name], PA-C    05/31/2018 1:27 PM

## 2018-06-03 NOTE — Telephone Encounter (Signed)
Tried to reach the pt though no vm came on. BMET was supposed to be done when she had Carotid U/S at the [facility] office. Pt has since had surgery. I will try again to reach pt. Pt has appt on 7/11 with Dr.Ross .

## 2018-06-04 DIAGNOSIS — I110 Hypertensive heart disease with heart failure: Secondary | ICD-10-CM | POA: Diagnosis not present

## 2018-06-04 DIAGNOSIS — I5032 Chronic diastolic (congestive) heart failure: Secondary | ICD-10-CM | POA: Diagnosis not present

## 2018-06-04 DIAGNOSIS — I2510 Atherosclerotic heart disease of native coronary artery without angina pectoris: Secondary | ICD-10-CM | POA: Diagnosis not present

## 2018-06-04 DIAGNOSIS — J449 Chronic obstructive pulmonary disease, unspecified: Secondary | ICD-10-CM | POA: Diagnosis not present

## 2018-06-04 DIAGNOSIS — I2699 Other pulmonary embolism without acute cor pulmonale: Secondary | ICD-10-CM | POA: Diagnosis not present

## 2018-06-04 DIAGNOSIS — I4891 Unspecified atrial fibrillation: Secondary | ICD-10-CM | POA: Diagnosis not present

## 2018-06-06 DIAGNOSIS — J449 Chronic obstructive pulmonary disease, unspecified: Secondary | ICD-10-CM | POA: Diagnosis not present

## 2018-06-07 DIAGNOSIS — Z79899 Other long term (current) drug therapy: Secondary | ICD-10-CM | POA: Diagnosis not present

## 2018-06-07 DIAGNOSIS — G894 Chronic pain syndrome: Secondary | ICD-10-CM | POA: Diagnosis not present

## 2018-06-07 DIAGNOSIS — Z5181 Encounter for therapeutic drug level monitoring: Secondary | ICD-10-CM | POA: Diagnosis not present

## 2018-06-10 ENCOUNTER — Other Ambulatory Visit: Payer: Self-pay

## 2018-06-10 NOTE — Patient Outreach (Signed)
Transition of care call: Placed call to patient who answered and reports she is doing well.. Reports no recent bleeding.  Reports increased swelling and 2 pound weight gain. States her fluid pill is working well today.  Reports no new concerns today. After reviewed medical record.Noted that MD office trying to reach patient for lab work. I reviewed with patient to call MD office to inquire about labs.  She agreed.  PLAN:    Will follow up in 1 month.Encouraged patient to call for concerns.  Tomasa Rand, RN, BSN, CEN [facility] [employer] [phone]

## 2018-06-16 DIAGNOSIS — R600 Localized edema: Secondary | ICD-10-CM | POA: Diagnosis not present

## 2018-06-16 DIAGNOSIS — R1031 Right lower quadrant pain: Secondary | ICD-10-CM | POA: Diagnosis not present

## 2018-06-16 DIAGNOSIS — Z86718 Personal history of other venous thrombosis and embolism: Secondary | ICD-10-CM | POA: Diagnosis not present

## 2018-06-16 DIAGNOSIS — M7989 Other specified soft tissue disorders: Secondary | ICD-10-CM | POA: Diagnosis not present

## 2018-06-16 DIAGNOSIS — R109 Unspecified abdominal pain: Secondary | ICD-10-CM | POA: Diagnosis not present

## 2018-06-22 DIAGNOSIS — R310 Gross hematuria: Secondary | ICD-10-CM | POA: Diagnosis not present

## 2018-06-22 DIAGNOSIS — M545 Low back pain: Secondary | ICD-10-CM | POA: Diagnosis not present

## 2018-06-30 ENCOUNTER — Ambulatory Visit (HOSPITAL_COMMUNITY)
Admission: RE | Admit: 2018-06-30 | Discharge: 2018-06-30 | Disposition: A | Discharge status: Home / Self Care | Payer: Medicare HMO | Source: Ambulatory Visit | Attending: Vascular Surgery | Admitting: Vascular Surgery

## 2018-06-30 DIAGNOSIS — K551 Chronic vascular disorders of intestine: Secondary | ICD-10-CM | POA: Diagnosis not present

## 2018-06-30 DIAGNOSIS — I70209 Unspecified atherosclerosis of native arteries of extremities, unspecified extremity: Secondary | ICD-10-CM | POA: Insufficient documentation

## 2018-07-01 ENCOUNTER — Encounter: Payer: Self-pay | Admitting: Internal Medicine

## 2018-07-01 ENCOUNTER — Encounter: Payer: Self-pay | Admitting: Vascular Surgery

## 2018-07-01 ENCOUNTER — Other Ambulatory Visit: Payer: Self-pay

## 2018-07-01 ENCOUNTER — Ambulatory Visit: Payer: Medicare HMO | Admitting: Vascular Surgery

## 2018-07-01 ENCOUNTER — Ambulatory Visit: Payer: Medicare HMO | Admitting: Internal Medicine

## 2018-07-01 ENCOUNTER — Other Ambulatory Visit: Payer: Medicare HMO

## 2018-07-01 VITALS — BP 152/69 | HR 87 | Temp 99.2°F | Resp 16 | Ht 68.0 in | Wt 157.0 lb

## 2018-07-01 VITALS — BP 140/84 | HR 93 | Ht 68.0 in | Wt 159.8 lb

## 2018-07-01 DIAGNOSIS — E785 Hyperlipidemia, unspecified: Secondary | ICD-10-CM

## 2018-07-01 DIAGNOSIS — I10 Essential (primary) hypertension: Secondary | ICD-10-CM | POA: Diagnosis not present

## 2018-07-01 DIAGNOSIS — R600 Localized edema: Secondary | ICD-10-CM | POA: Diagnosis not present

## 2018-07-01 DIAGNOSIS — K551 Chronic vascular disorders of intestine: Secondary | ICD-10-CM | POA: Diagnosis not present

## 2018-07-01 DIAGNOSIS — I70209 Unspecified atherosclerosis of native arteries of extremities, unspecified extremity: Secondary | ICD-10-CM | POA: Diagnosis not present

## 2018-07-01 DIAGNOSIS — I2510 Atherosclerotic heart disease of native coronary artery without angina pectoris: Secondary | ICD-10-CM | POA: Diagnosis not present

## 2018-07-01 DIAGNOSIS — R0609 Other forms of dyspnea: Secondary | ICD-10-CM

## 2018-07-01 DIAGNOSIS — R0602 Shortness of breath: Secondary | ICD-10-CM | POA: Diagnosis not present

## 2018-07-01 DIAGNOSIS — R0600 Dyspnea, unspecified: Secondary | ICD-10-CM

## 2018-07-01 DIAGNOSIS — I5032 Chronic diastolic (congestive) heart failure: Secondary | ICD-10-CM | POA: Diagnosis not present

## 2018-07-01 DIAGNOSIS — I82502 Chronic embolism and thrombosis of unspecified deep veins of left lower extremity: Secondary | ICD-10-CM

## 2018-07-01 LAB — BASIC METABOLIC PANEL
BUN/Creatinine Ratio: 12 (ref 12–28)
BUN: 14 mg/dL (ref 8–27)
CO2: 27 mmol/L (ref 20–29)
Calcium: 9.5 mg/dL (ref 8.7–10.3)
Chloride: 98 mmol/L (ref 96–106)
Creatinine, Ser: 1.13 mg/dL — ABNORMAL HIGH (ref 0.57–1.00)
GFR calc Af Amer: 54 mL/min/{1.73_m2} — ABNORMAL LOW (ref >59)
GFR calc non Af Amer: 47 mL/min/{1.73_m2} — ABNORMAL LOW (ref >59)
Glucose: 87 mg/dL (ref 65–99)
Potassium: 4.1 mmol/L (ref 3.5–5.2)
Sodium: 141 mmol/L (ref 134–144)

## 2018-07-01 LAB — HEPATIC FUNCTION PANEL
ALT: 5 IU/L (ref 0–32)
AST: 12 IU/L (ref 0–40)
Albumin: 3.8 g/dL (ref 3.5–4.8)
Alkaline Phosphatase: 78 IU/L (ref 39–117)
Bilirubin Total: 0.2 mg/dL (ref 0.0–1.2)
Bilirubin, Direct: 0.11 mg/dL (ref 0.00–0.40)
Total Protein: 6.8 g/dL (ref 6.0–8.5)

## 2018-07-01 LAB — LIPID PANEL
Chol/HDL Ratio: 2.8 ratio (ref 0.0–4.4)
Cholesterol, Total: 106 mg/dL (ref 100–199)
HDL: 38 mg/dL — ABNORMAL LOW (ref >39)
LDL Calculated: 50 mg/dL (ref 0–99)
Triglycerides: 88 mg/dL (ref 0–149)
VLDL Cholesterol Cal: 18 mg/dL (ref 5–40)

## 2018-07-01 LAB — PRO B NATRIURETIC PEPTIDE: NT-Pro BNP: 853 pg/mL — ABNORMAL HIGH (ref 0–738)

## 2018-07-01 NOTE — Patient Instructions (Signed)
Your physician recommends that you continue on your current medications as directed. Please refer to the Current Medication list given to you today.  Your physician recommends that you return for lab work today (BNP)  Your physician recommends that you schedule a follow-up appointment in: November, 2019 with Dr. Harrington Challenger.

## 2018-07-01 NOTE — Progress Notes (Signed)
Patient is a 77 year old female who returns for follow-up today.  She has chronic abdominal pain.  She has had a previous superior mesenteric artery stent by my partner Dr. Donzetta Matters.  Her pain is primarily on the right side of her abdomen with radiation to the back.  It does not seem to be associated with food.  She is not losing weight.  She is able to eat meals.  She has normal bowel movements.  She did have a history of a GI bleed but has not had any further bleeding recently.  She has known history of a DVT in the left leg and had to have an IVC filter placed in the past secondary to ability to take anticoagulation with a rectus wall sheath hematoma and GI bleeding.  She remains off anticoagulation currently.  She does still complain of left leg swelling.  She is on a statin and aspirin.  Review of systems: She become short of breath with minimal exertion.  She denies chest pain.  Past Medical History:  Diagnosis Date  . Anemia    bld. transfusion post lumbar surgery- 2012  . Anxiety   . Arthralgia    NOS  . Blood transfusion 2011   WITH BACK SURGERY  . CAD (coronary artery disease)    s/p CABG 2004; s/p DES to LM in 2010;  [place] 10/29/11: EF 50-55%, mild elevated filling pressures, no pulmonary HTN, LM 90% ISR, LAD and CFX occluded, S-RI occluded (old), S-OM3 ok and L-LAD ok, native nondominant RCA 95% -  med rx recommended ; Lexiscan Myoview 7/13 at [facility]: demonstrated "normal LV function, anterior attenuation and localized ischemia, inferior, basilar, mid section"  . Carotid artery disease ([place])    Carotid US 8/88:  RICA 9-16; LICA 94-50; R subclavian stenosis - Repeat 1 year.  . CHF (congestive heart failure) ([place])   . Chronic diastolic heart failure (HCC)    Echo 9/10: EF 38-88%, grade 1 diastolic dysfunction  . COPD (chronic obstructive pulmonary disease) ([place])    Emphysema dxed by Dr. Woody Seller in [place] based on PFTs per pt in 2006; placed on albuterol  . Depression    TAKES CELEXA   AND  (OFF- WELBUTRIN)  . DVT of lower extremity (deep venous thrombosis) (HCC)    recurrent. bilateral (2 episodes)  . Dyspnea   . Dysrhythmia    afib with cabg  . Exertional angina (HCC)    Treated with Isosorbide, Ranexa, amlodipine; intolerant to metoprolol  . GERD (gastroesophageal reflux disease)   . HLD (hyperlipidemia)   . Hypertension   . Hyperthyroidism   . Obesity (BMI 30-39.9) 2009   BMI 33  . Osteoarthritis   . Oxygen deficiency   . Oxygen dependent    oses 4L at home as needed   . Pneumonia   . PVD (peripheral vascular disease) ([place])   . Sleep apnea 2012   USED CPAP THEN  STARTED USING SPIRIVA , Nov. 2013- last evaluation , changed from mask to aparatus that is just for her nose.. ; reports 05-28-18 "the mask smothers me so i dont use it right now"    Past Surgical History:  Procedure Laterality Date  . ANKLE SURGERY  2004  . [place]   lower; another scheduled, opt. reports 4 back- lumbar, 3 cerv. fusions  for later 2009  . BREAST ENHANCEMENT SURGERY    . CARDIAC CATHETERIZATION  11/2009   Patent LIMA to LAD and patent SVG to OM1. Occluded SVG  to ramus and diagonal. Left main: 90% ostial stenosis, LCX 60-70% proximal stenosis  . CHOLECYSTECTOMY    . COLONOSCOPY WITH PROPOFOL N/A 02/17/2017   Procedure: COLONOSCOPY WITH PROPOFOL;  Surgeon: Jerene Bears, MD;  Location[facility] ENDOSCOPY;  Service: Endoscopy;  Laterality: N/A;  . CORONARY ANGIOPLASTY WITH STENT PLACEMENT  11/2009   Drug eluting stent to left main artery: 4.0 X 12 mm Ion   . CORONARY ARTERY BYPASS GRAFT  2004  . ESOPHAGOGASTRODUODENOSCOPY N/A 02/14/2017   Procedure: ESOPHAGOGASTRODUODENOSCOPY (EGD);  Surgeon: Doran Stabler, MD;  Location: [facility] ENDOSCOPY;  Service: Endoscopy;  Laterality: N/A;  . EYE SURGERY  2006   BILATERAL CAT EXT.   Marland Kitchen GIVENS CAPSULE STUDY N/A 02/15/2017   Procedure: GIVENS CAPSULE STUDY;  Surgeon: Doran Stabler, MD;  Location: [place];  Service: Endoscopy;   Laterality: N/A;  . GROIN MASS OPEN BIOPSY  2004  . IR IVC FILTER PLMT / S&I /IMG GUID/MOD SED  03/14/2018  . IR PARACENTESIS  03/10/2018  . JOINT REPLACEMENT  2012    BILATERAL KNEES  . JOINT REPLACEMENT  2010    RIGHT HIP REPLACEMENT  . NECK SURGERY  12/08   Due for repeat neck surgery  . PERIPHERAL VASCULAR INTERVENTION Left 03/05/2018   Procedure: PERIPHERAL VASCULAR INTERVENTION;  Surgeon: Elam Dutch, MD;  Location: [place] CV LAB;  Service: Cardiovascular;  Laterality: Left;  Attempted unsuccess\ful Per Dr. Eden Lathe  . PERIPHERAL VASCULAR INTERVENTION  03/08/2018   Procedure: PERIPHERAL VASCULAR INTERVENTION;  Surgeon: Waynetta Sandy, MD;  Location: [place] CV LAB;  Service: Cardiovascular;;  SMA Stent   . REVERSE SHOULDER ARTHROPLASTY  02/26/2012   Procedure: REVERSE SHOULDER ARTHROPLASTY;  Surgeon: Marin Shutter, MD;  Location: [place];  Service: Orthopedics;  Laterality: Right;  RIGHT SHOULDER REVERSED ARTHROPLASTY  . SHOULDER ARTHROSCOPY WITH SUBACROMIAL DECOMPRESSION Left 02/10/2013   Procedure: SHOULDER ARTHROSCOPY WITH SUBACROMIAL DECOMPRESSION DISTAL CLAVICLE RESECTION;  Surgeon: Marin Shutter, MD;  Location: [place];  Service: Orthopedics;  Laterality: Left;  DISTAL CLAVICLE RESECTION  . TOTAL HIP ARTHROPLASTY  2007   Right  . TRANSURETHRAL RESECTION OF BLADDER TUMOR N/A 05/31/2018   Procedure: TRANSURETHRAL RESECTION OF BLADDER TUMOR (TURBT);  Surgeon: Lucas Mallow, MD;  [facility];  Service: Urology;  Laterality: N/A;  . VISCERAL ANGIOGRAPHY N/A 03/05/2018   Procedure: VISCERAL ANGIOGRAPHY;  Surgeon: Elam Dutch, MD;  Location: [place] CV LAB;  Service: Cardiovascular;  Laterality: N/A;  . VISCERAL ANGIOGRAPHY N/A 03/08/2018   Procedure: VISCERAL ANGIOGRAPHY;  Surgeon: Waynetta Sandy, MD;  Location: [place] CV LAB;  Service: Cardiovascular;  Laterality: N/A;    Current Outpatient Medications on File Prior to Visit   Medication Sig Dispense Refill  . allopurinol (ZYLOPRIM) 100 MG tablet Take 100 mg by mouth daily.    Marland Kitchen aspirin EC 81 MG tablet Take 81 mg by mouth daily.    Marland Kitchen atorvastatin (LIPITOR) 20 MG tablet Take 1 tablet (20 mg total) by mouth daily. 90 tablet 3  . citalopram (CELEXA) 20 MG tablet Take 30 mg by mouth daily.     . CVS D3 1000 units capsule Take 1,000 Units by mouth daily.  3  . cyclobenzaprine (FLEXERIL) 10 MG tablet Take 10 mg by mouth 3 (three) times daily as needed for muscle spasms.     Marland Kitchen docusate sodium (COLACE) 100 MG capsule Take 100 mg by mouth daily.    . feeding supplement, ENSURE  ENLIVE, (ENSURE ENLIVE) LIQD Take 237 mLs by mouth 3 (three) times daily between meals. 237 mL 12  . ferrous sulfate 325 (65 FE) MG tablet Take 325 mg by mouth daily with breakfast.    . furosemide (LASIX) 20 MG tablet Take 40-60 mg by mouth See admin instructions. Take 60 mg by mouth in the morning and take 40 mg by mouth in the evening    . HYDROcodone-acetaminophen (NORCO) 5-325 MG tablet Take 1 tablet by mouth every 4 (four) hours as needed for moderate pain. 6 tablet 0  . INCRUSE ELLIPTA 62.5 MCG/INH AEPB Inhale 1 puff into the lungs 2 (two) times daily.     . Ipratropium-Albuterol (COMBIVENT) 20-100 MCG/ACT AERS respimat Inhale 1 puff into the lungs every 6 (six) hours as needed for wheezing.     Marland Kitchen ipratropium-albuterol (DUONEB) 0.5-2.5 (3) MG/3ML SOLN Take 3 mLs by nebulization 4 (four) times daily as needed (for shortness of breath).     . magnesium oxide (MAG-OX) 400 (241.3 Mg) MG tablet Take 1 tablet (400 mg total) by mouth 2 (two) times daily.    . nitroGLYCERIN (NITROSTAT) 0.4 MG SL tablet Place 1 tablet (0.4 mg total) under the tongue every 5 (five) minutes as needed for chest pain. For chest pain 25 tablet 3  . omeprazole (PRILOSEC) 40 MG capsule Take 40 mg by mouth 2 (two) times daily.     . Oxycodone HCl 10 MG TABS Take 1 tablet (10 mg total) by mouth every 6 (six) hours as needed.  (Patient taking differently: Take 10 mg by mouth every 6 (six) hours as needed (for pain). ) 2 tablet 0  . pilocarpine (PILOCAR) 1 % ophthalmic solution Place 1 drop into both eyes 2 (two) times daily.    . potassium chloride SA (K-DUR,KLOR-CON) 20 MEQ tablet Take 20 mEq by mouth daily.    . pramipexole (MIRAPEX) 0.25 MG tablet Take 0.25 mg by mouth at bedtime.  3  . roflumilast (DALIRESP) 500 MCG TABS tablet Take 500 mcg by mouth daily.     . traZODone (DESYREL) 50 MG tablet Take 150 mg by mouth at bedtime.      No current facility-administered medications on file prior to visit.    Physical exam:  Vitals:   07/01/18 1025  BP: (!) 152/69  Pulse: 87  Resp: 16  Temp: 99.2 F (37.3 C)  TempSrc: Oral  SpO2: 98%  Weight: 157 lb (71.2 kg)  Height: 5\' 8"  (1.727 m)    Abdomen: Soft mild tenderness no mass no abdominal bruit  Extremities: 2+ femoral pulses chronic edematous changes left lower extremity approximately 10% larger than the right lower extremity  Chest: Clear to auscultation bilaterally  Cardiac: Regular rate and rhythm  Data: Patient had a duplex ultrasound yesterday of her mesenteric vessels.  This showed increased velocities in the stent of 405 cm/s at the proximal aspect.  I also reviewed her recent CT scan of the abdomen and pelvis which showed the SMA stent was patent there is calcification of the origin which makes visualizing this difficult.  There is good flow of contrast distal to the stent.  Assessment: Patient with chronic abdominal pain.  Difficult to know whether or not this is recurrent mesenteric symptoms versus her other chronic abdominal pain problems.  She certainly does not have postprandial pain.  I discussed with her the possibility of repeating her arteriogram for further evaluation of her stent.  However, she is very reluctant to proceed with another arteriogram  at this point.  I also discussed with her that she has been in the emergency room several  times with her abdominal pain and that if she continues to return to the emergency room with abdominal pain then the only option we would have would be to repeat her arteriogram.  She stated that she would consent to this if she has to come back to the ER again.  As far as her left leg DVT is concerned.  This is chronic at this point.  I do not feel she is a very good candidate for anticoagulation.  She does have a filter in place to prevent pulmonary embolus.  Plan: Otherwise the patient will continue to follow-up with [place] and see [place] back in 3 months time with a repeat mesenteric duplex exam.  Compression stocking for the left leg to reduce symptoms.  Ruta Hinds, MD [facility] Office: [phone] Pager: [phone]

## 2018-07-01 NOTE — Progress Notes (Signed)
Cardiology Office Note   Date:  07/01/2018   ID:  [name], DOB Jun 28, 1941, MRN [id number]  PCP:  [name], MD  Cardiologist:   Dorris Carnes, MD   F/U of CAD     History of Present Illness: [name] is a 77 y.o. female with a history of CAD (s/p CABG 2004 and DES to L [place]. Last LHT 11/12 showed instent restenosis to the LM. LCX was occluded in prox segment. SVG to OM was patient as well as LAD RCA had signif disease but appeared nondominant  The patient was admitted to [facility] in July 2013 with CP [facility] showed localized ischemia in the inferior wall (base, mid) Ranexa was added. She also has a hsitory of recurrent DVT (on chronic coumadin), HTN, COPD, HL and carotid stenosis.  Pt with history of UGI bleed  EGD with gastric erosions, AVMs in [place].  COlonoscopy with pandiverticulits  Anticoag d/c'd I saw her in August 2018  Breathing a little slower  Recomm a lexiscan myovue  This showed a small area of distal anteiror ischemia, could not exclude shifting breast   After I saw her last summer she had choley.   Felt better when I saw her in November In March 2019 she was admitted with pulmonary emboli and acute DVT in L femoral vein along with ischemic colitis.   Found to have a SMA stenosis and underwent stentinginbg by vascular surgery.   Hosp with acute anemia due to abdominal hematoma on heparin.   Tx    Cr peaked at 3.2    IVC filter placed  Also underwent paracentesis for ascites.  CT with cirrhosis   REadmitted in April with UTI and hematuria.  Echo done   LVEF normal    Seen by [facility] in May 2019   [facility] demonstrated poss narrowing of SMA stent  Has appt later today to review most recent scan The pt denies CP   Says she gets SOB with activity Notes increased swelling in L Leg  Worried   Current Meds  Medication Sig  . allopurinol (ZYLOPRIM) 100 MG tablet Take 100 mg by mouth daily.  Marland Kitchen aspirin EC 81 MG tablet Take 81 mg by mouth daily.  Marland Kitchen  atorvastatin (LIPITOR) 20 MG tablet Take 1 tablet (20 mg total) by mouth daily.  . citalopram (CELEXA) 20 MG tablet Take 30 mg by mouth daily.   . CVS D3 1000 units capsule Take 1,000 Units by mouth daily.  . cyclobenzaprine (FLEXERIL) 10 MG tablet Take 10 mg by mouth 3 (three) times daily as needed for muscle spasms.   Marland Kitchen docusate sodium (COLACE) 100 MG capsule Take 100 mg by mouth daily.  . feeding supplement, ENSURE ENLIVE, (ENSURE ENLIVE) LIQD Take 237 mLs by mouth 3 (three) times daily between meals.  . ferrous sulfate 325 (65 FE) MG tablet Take 325 mg by mouth daily with breakfast.  . furosemide (LASIX) 20 MG tablet Take 40-60 mg by mouth See admin instructions. Take 60 mg by mouth in the morning and take 40 mg by mouth in the evening  . HYDROcodone-acetaminophen (NORCO) 5-325 MG tablet Take 1 tablet by mouth every 4 (four) hours as needed for moderate pain.  . INCRUSE ELLIPTA 62.5 MCG/INH AEPB Inhale 1 puff into the lungs 2 (two) times daily.   . Ipratropium-Albuterol (COMBIVENT) 20-100 MCG/ACT AERS respimat Inhale 1 puff into the lungs every 6 (six) hours as needed for wheezing.   Marland Kitchen  ipratropium-albuterol (DUONEB) 0.5-2.5 (3) MG/3ML SOLN Take 3 mLs by nebulization 4 (four) times daily as needed (for shortness of breath).   . magnesium oxide (MAG-OX) 400 (241.3 Mg) MG tablet Take 1 tablet (400 mg total) by mouth 2 (two) times daily.  . nitroGLYCERIN (NITROSTAT) 0.4 MG SL tablet Place 1 tablet (0.4 mg total) under the tongue every 5 (five) minutes as needed for chest pain. For chest pain  . omeprazole (PRILOSEC) 40 MG capsule Take 40 mg by mouth 2 (two) times daily.   . Oxycodone HCl 10 MG TABS Take 1 tablet (10 mg total) by mouth every 6 (six) hours as needed. (Patient taking differently: Take 10 mg by mouth every 6 (six) hours as needed (for pain). )  . pilocarpine (PILOCAR) 1 % ophthalmic solution Place 1 drop into both eyes 2 (two) times daily.  . potassium chloride SA (K-DUR,KLOR-CON) 20  MEQ tablet Take 20 mEq by mouth daily.  . pramipexole (MIRAPEX) 0.25 MG tablet Take 0.25 mg by mouth at bedtime.  . roflumilast (DALIRESP) 500 MCG TABS tablet Take 500 mcg by mouth daily.   . traZODone (DESYREL) 50 MG tablet Take 150 mg by mouth at bedtime.    I saw the pt at the endo ofAugust   Based on aove set up for myovue  This showed a small region of distal anterior and apical ischemia, could not exclude soft tissue attenuation    Allergies:   Zofran [ondansetron hcl]; Zolpidem tartrate; Ativan [lorazepam]; Pitavastatin; Ropinirole; and Penicillins   Past Medical History:  Diagnosis Date  . Anemia    bld. transfusion post lumbar surgery- 2012  . Anxiety   . Arthralgia    NOS  . Blood transfusion 2011   WITH BACK SURGERY  . CAD (coronary artery disease)    s/p CABG 2004; s/p DES to LM in 2010;  [place] 10/29/11: EF 50-55%, mild elevated filling pressures, no pulmonary HTN, LM 90% ISR, LAD and CFX occluded, S-RI occluded (old), S-OM3 ok and L-LAD ok, native nondominant RCA 95% -  med rx recommended ; Lexiscan Myoview 7/13 at [facility]: demonstrated "normal LV function, anterior attenuation and localized ischemia, inferior, basilar, mid section"  . Carotid artery disease ([place])    Carotid US 7/25:  RICA 3-66; LICA 44-03; R subclavian stenosis - Repeat 1 year.  . CHF (congestive heart failure) ([place])   . Chronic diastolic heart failure (HCC)    Echo 9/10: EF 47-42%, grade 1 diastolic dysfunction  . COPD (chronic obstructive pulmonary disease) ([place])    Emphysema dxed by Dr. Woody Seller in [place] based on PFTs per pt in 2006; placed on albuterol  . Depression    TAKES CELEXA  AND  (OFF- WELBUTRIN)  . DVT of lower extremity (deep venous thrombosis) (HCC)    recurrent. bilateral (2 episodes)  . Dyspnea   . Dysrhythmia    afib with cabg  . Exertional angina (HCC)    Treated with Isosorbide, Ranexa, amlodipine; intolerant to metoprolol  . GERD (gastroesophageal reflux disease)   . HLD  (hyperlipidemia)   . Hypertension   . Hyperthyroidism   . Obesity (BMI 30-39.9) 2009   BMI 33  . Osteoarthritis   . Oxygen deficiency   . Oxygen dependent    oses 4L at home as needed   . Pneumonia   . PVD (peripheral vascular disease) ([place])   . Sleep apnea 2012   USED CPAP THEN  STARTED USING SPIRIVA , Nov. 2013- last evaluation , changed from  mask to aparatus that is just for her nose.. ; reports 05-28-18 "the mask smothers me so i dont use it right now"    Past Surgical History:  Procedure Laterality Date  . ANKLE SURGERY  2004  . [place]   lower; another scheduled, opt. reports 4 back- lumbar, 3 cerv. fusions  for later 2009  . BREAST ENHANCEMENT SURGERY    . CARDIAC CATHETERIZATION  11/2009   Patent LIMA to LAD and patent SVG to OM1. Occluded SVG to ramus and diagonal. Left main: 90% ostial stenosis, LCX 60-70% proximal stenosis  . CHOLECYSTECTOMY    . COLONOSCOPY WITH PROPOFOL N/A 02/17/2017   Procedure: COLONOSCOPY WITH PROPOFOL;  Surgeon: Jerene Bears, MD;  Location: [facility] ENDOSCOPY;  Service: Endoscopy;  Laterality: N/A;  . CORONARY ANGIOPLASTY WITH STENT PLACEMENT  11/2009   Drug eluting stent to left main artery: 4.0 X 12 mm Ion   . CORONARY ARTERY BYPASS GRAFT  2004  . ESOPHAGOGASTRODUODENOSCOPY N/A 02/14/2017   Procedure: ESOPHAGOGASTRODUODENOSCOPY (EGD);  Surgeon: Doran Stabler, MD;  Location: [facility] ENDOSCOPY;  Service: Endoscopy;  Laterality: N/A;  . EYE SURGERY  2006   BILATERAL CAT EXT.   Marland Kitchen GIVENS CAPSULE STUDY N/A 02/15/2017   Procedure: GIVENS CAPSULE STUDY;  Surgeon: Doran Stabler, MD;  Location: [place];  Service: Endoscopy;  Laterality: N/A;  . GROIN MASS OPEN BIOPSY  2004  . IR IVC FILTER PLMT / S&I /IMG GUID/MOD SED  03/14/2018  . IR PARACENTESIS  03/10/2018  . JOINT REPLACEMENT  2012    BILATERAL KNEES  . JOINT REPLACEMENT  2010    RIGHT HIP REPLACEMENT  . NECK SURGERY  12/08   Due for repeat neck surgery  . PERIPHERAL VASCULAR  INTERVENTION Left 03/05/2018   Procedure: PERIPHERAL VASCULAR INTERVENTION;  Surgeon: Elam Dutch, MD;  Location: [place] CV LAB;  Service: Cardiovascular;  Laterality: Left;  Attempted unsuccess\ful Per Dr. Eden Lathe  . PERIPHERAL VASCULAR INTERVENTION  03/08/2018   Procedure: PERIPHERAL VASCULAR INTERVENTION;  Surgeon: Waynetta Sandy, MD;  Location: [place] CV LAB;  Service: Cardiovascular;;  SMA Stent   . REVERSE SHOULDER ARTHROPLASTY  02/26/2012   Procedure: REVERSE SHOULDER ARTHROPLASTY;  Surgeon: Marin Shutter, MD;  Location: [place];  Service: Orthopedics;  Laterality: Right;  RIGHT SHOULDER REVERSED ARTHROPLASTY  . SHOULDER ARTHROSCOPY WITH SUBACROMIAL DECOMPRESSION Left 02/10/2013   Procedure: SHOULDER ARTHROSCOPY WITH SUBACROMIAL DECOMPRESSION DISTAL CLAVICLE RESECTION;  Surgeon: Marin Shutter, MD;  Location: [place];  Service: Orthopedics;  Laterality: Left;  DISTAL CLAVICLE RESECTION  . TOTAL HIP ARTHROPLASTY  2007   Right  . TRANSURETHRAL RESECTION OF BLADDER TUMOR N/A 05/31/2018   Procedure: TRANSURETHRAL RESECTION OF BLADDER TUMOR (TURBT);  Surgeon: Lucas Mallow, MD;  [facility];  Service: Urology;  Laterality: N/A;  . VISCERAL ANGIOGRAPHY N/A 03/05/2018   Procedure: VISCERAL ANGIOGRAPHY;  Surgeon: Elam Dutch, MD;  Location: [place] CV LAB;  Service: Cardiovascular;  Laterality: N/A;  . VISCERAL ANGIOGRAPHY N/A 03/08/2018   Procedure: VISCERAL ANGIOGRAPHY;  Surgeon: Waynetta Sandy, MD;  Location: [place] CV LAB;  Service: Cardiovascular;  Laterality: N/A;     Social History:  The patient  reports that she quit smoking about 15 years ago. She quit after 45.00 years of use. She has never used smokeless tobacco. She reports that she does not drink alcohol or use drugs.   Family History:  The patient's family history includes COPD  in her father; Cancer in her mother, sister, and unknown relative; Colitis in her unknown relative; Heart  disease in her father, sister, and unknown relative; Lung cancer in her father; Ovarian cancer in her mother.    ROS:  Please see the history of present illness. All other systems are reviewed and  Negative to the above problem except as noted.    PHYSICAL EXAM: VS:  BP 140/84   Pulse 93   Ht 5\' 8"  (1.727 m)   Wt 72.5 kg (159 lb 12.8 oz)   SpO2 98%   BMI 24.30 kg/m   GEN: Well nourished, well developed, in no acute distress  HEENT: normal  Neck: no JVD, carotid bruits, or masses Cardiac: RRR; no murmurs, rubs, or gallops,1+dema  Respiratory:  clear to auscultation  Some decreased airflow with wheezes   GI: soft, nontender, nondistended, + BS  No hepatomegaly  MS: no deformity Moving all extremities   Skin: warm and dry, no rash Neuro:  Strength and sensation are intact Psych: euthymic mood, full affect   EKG:  EKG is not ordered today.   Lipid Panel    Component Value Date/Time   CHOL 103 02/06/2017 1558   CHOL 98 05/23/2014 0857   TRIG 68 02/06/2017 1558   TRIG 83 05/23/2014 0857   HDL 51 02/06/2017 1558   HDL 35 (L) 05/23/2014 0857   CHOLHDL 2.0 02/06/2017 1558   CHOLHDL 4 03/03/2013 1136   VLDL 24.4 03/03/2013 1136   LDLCALC 38 02/06/2017 1558   LDLCALC 46 05/23/2014 0857      Wt Readings from Last 3 Encounters:  07/01/18 72.5 kg (159 lb 12.8 oz)  06/10/18 72.6 kg (160 lb)  05/31/18 73 kg (161 lb)      ASSESSMENT AND PLAN:  1  CAD   S/p CABG 2004  Stent in 2010  No symptoms of angina  2   Hx DVT/PE   Has IVC filter    Cannot be on anticoag due to bleedking    Pt still SOB with activity    Also notes recent increase in swelling in L leg    I have contacted [facility] office   Will check BNP  3   PVOD   Pt has RLQ pain   Being seen in vascular surgery today to discuss abd USN    3  HL   COntinue meds   4  Hypothyroidism  Will need to follow  5  HTN   Fair BP control  F/U in Nov 2018   Current medicines are reviewed at length with the patient  today.  The patient does not have concerns regarding medicines.  Signed, Dorris Carnes, MD  07/01/2018 9:07 AM    [address], [place], [place]  [address] Phone: [phone]; Fax: [phone]

## 2018-07-02 ENCOUNTER — Telehealth: Payer: Self-pay

## 2018-07-02 DIAGNOSIS — R7989 Other specified abnormal findings of blood chemistry: Secondary | ICD-10-CM

## 2018-07-02 NOTE — Telephone Encounter (Signed)
-----   Message from Liliane Shi, [place] sent at 07/01/2018  7:31 PM EDT ----- Liver enzymes (AST, ALT) normal.  Cholesterol numbers look good (LDL is less than 70 which is where we want it to be). Kidney function is a little impaired (Creatinine increased).  The potassium is normal. PLAN:  1. Repeat BMET in 2 weeks. [name], PA-C    07/01/2018 7:24 PM

## 2018-07-02 NOTE — Telephone Encounter (Signed)
Patient aware of lab results. Per [name] PA, Liver enzymes (AST, ALT) normal. Cholesterol numbers look good (LDL is less than 70 which is where we want it to be). Kidney function is a little impaired (Creatinine increased). The potassium is normal. PLAN: 1. Repeat BMET in 2 weeks. Patient verbalized understanding and will come in on 07/15/18 for repeat BMET.

## 2018-07-05 ENCOUNTER — Other Ambulatory Visit: Payer: Self-pay | Admitting: *Deleted

## 2018-07-05 DIAGNOSIS — R0602 Shortness of breath: Secondary | ICD-10-CM

## 2018-07-05 NOTE — Progress Notes (Signed)
Notes recorded by Fay Records, MD on 07/05/2018 at 12:03 AM EDT Repeat BNP when she comes in for labs again (see note by Kathleen Argue) ------   Order placed.  Appointment is scheduled for 07/15/18.

## 2018-07-06 DIAGNOSIS — J449 Chronic obstructive pulmonary disease, unspecified: Secondary | ICD-10-CM | POA: Diagnosis not present

## 2018-07-12 ENCOUNTER — Ambulatory Visit: Payer: Medicare HMO

## 2018-07-12 ENCOUNTER — Other Ambulatory Visit: Payer: Self-pay

## 2018-07-12 NOTE — Patient Outreach (Signed)
Case closure telephone call:  Placed call to patient who answered and reports that she is feeling some better. Reports that she continues to have abdominal pain but states she does not want any more surgery. Reports no recent bleeding. Reports left leg continues to be swollen. States she is taking her medications as prescribed and wearing her compression hose.  Reports no new concerns or issues today.    PLAN:Reviewed with patient she has been out of the hospital for 60 days. Reviewed goals are met and patient is in agreement for case closure. Patient has agreed to call if she is needs assistance in the future.  Will close case as goals are met. Will send patient and MD case closure letters.  Tomasa Rand, RN, BSN, CEN [facility] [employer] [phone]

## 2018-07-15 ENCOUNTER — Telehealth: Payer: Self-pay | Admitting: *Deleted

## 2018-07-15 ENCOUNTER — Other Ambulatory Visit: Payer: Medicare HMO | Admitting: *Deleted

## 2018-07-15 DIAGNOSIS — R7989 Other specified abnormal findings of blood chemistry: Secondary | ICD-10-CM

## 2018-07-15 DIAGNOSIS — I5032 Chronic diastolic (congestive) heart failure: Secondary | ICD-10-CM | POA: Diagnosis not present

## 2018-07-15 DIAGNOSIS — E876 Hypokalemia: Secondary | ICD-10-CM

## 2018-07-15 DIAGNOSIS — I70209 Unspecified atherosclerosis of native arteries of extremities, unspecified extremity: Secondary | ICD-10-CM | POA: Diagnosis not present

## 2018-07-15 LAB — BASIC METABOLIC PANEL
BUN/Creatinine Ratio: 15 (ref 12–28)
BUN: 17 mg/dL (ref 8–27)
CO2: 28 mmol/L (ref 20–29)
Calcium: 9.3 mg/dL (ref 8.7–10.3)
Chloride: 98 mmol/L (ref 96–106)
Creatinine, Ser: 1.17 mg/dL — ABNORMAL HIGH (ref 0.57–1.00)
GFR calc Af Amer: 52 mL/min/{1.73_m2} — ABNORMAL LOW (ref >59)
GFR calc non Af Amer: 45 mL/min/{1.73_m2} — ABNORMAL LOW (ref >59)
Glucose: 90 mg/dL (ref 65–99)
Potassium: 3.4 mmol/L — ABNORMAL LOW (ref 3.5–5.2)
Sodium: 142 mmol/L (ref 134–144)

## 2018-07-15 MED ORDER — FUROSEMIDE 40 MG PO TABS: 40.0000 mg | ORAL_TABLET | Freq: Two times a day (BID) | ORAL | 1 refills | Status: DC

## 2018-07-15 NOTE — Telephone Encounter (Signed)
Spoke with pt and went over results and recommendations per [name], PA-C.  Pt verbalized understanding and was in agreement with this plan.  Pt will have labs drawn 8/1.

## 2018-07-15 NOTE — Telephone Encounter (Signed)
-----   Message from Liliane Shi, [place] sent at 07/15/2018  5:08 PM EDT ----- The following abnormalities are noted: Creatinine elevated.  Potassium low. All other values are normal, stable or within acceptable limits. Medication changes / Follow up labs / Other changes or recommendations:   1.  Take extra 40 mEq potassium today 2.  Decrease Lasix to 40 mg twice daily 3.  BMET 1 week [name], PA-C 07/15/2018 5:06 PM

## 2018-07-17 LAB — SPECIMEN STATUS REPORT

## 2018-07-17 LAB — PRO B NATRIURETIC PEPTIDE: NT-Pro BNP: 679 pg/mL (ref 0–738)

## 2018-07-22 ENCOUNTER — Other Ambulatory Visit: Payer: Medicare HMO | Admitting: *Deleted

## 2018-07-22 DIAGNOSIS — E876 Hypokalemia: Secondary | ICD-10-CM | POA: Diagnosis not present

## 2018-07-22 DIAGNOSIS — R0602 Shortness of breath: Secondary | ICD-10-CM

## 2018-07-22 DIAGNOSIS — R7989 Other specified abnormal findings of blood chemistry: Secondary | ICD-10-CM

## 2018-07-23 ENCOUNTER — Telehealth: Payer: Self-pay

## 2018-07-23 LAB — BASIC METABOLIC PANEL
BUN/Creatinine Ratio: 12 (ref 12–28)
BUN: 13 mg/dL (ref 8–27)
CO2: 29 mmol/L (ref 20–29)
Calcium: 9 mg/dL (ref 8.7–10.3)
Chloride: 103 mmol/L (ref 96–106)
Creatinine, Ser: 1.05 mg/dL — ABNORMAL HIGH (ref 0.57–1.00)
GFR calc Af Amer: 59 mL/min/{1.73_m2} — ABNORMAL LOW (ref >59)
GFR calc non Af Amer: 51 mL/min/{1.73_m2} — ABNORMAL LOW (ref >59)
Glucose: 104 mg/dL — ABNORMAL HIGH (ref 65–99)
Potassium: 3.4 mmol/L — ABNORMAL LOW (ref 3.5–5.2)
Sodium: 145 mmol/L — ABNORMAL HIGH (ref 134–144)

## 2018-07-23 LAB — PRO B NATRIURETIC PEPTIDE: NT-Pro BNP: 1217 pg/mL — ABNORMAL HIGH (ref 0–738)

## 2018-07-23 NOTE — Telephone Encounter (Signed)
Notes recorded by Frederik Schmidt, RN on 07/23/2018 at 2:08 PM EDT [email or web address] 8/2 ------

## 2018-07-23 NOTE — Telephone Encounter (Signed)
-----   Message from Liliane Shi, [place] sent at 07/23/2018  1:43 PM EDT ----- The following abnormalities are noted:  The potassium is low. Creatinine is stable.  All other values are normal, stable or within acceptable limits. Medication changes / Follow up labs / Other changes or recommendations:   - Increase K+ to 20 mEq Twice daily  - Repeat BMET 2 weeks. [name], PA-C 07/23/2018 1:41 PM

## 2018-07-26 ENCOUNTER — Telehealth: Payer: Self-pay

## 2018-07-26 DIAGNOSIS — E8770 Fluid overload, unspecified: Secondary | ICD-10-CM

## 2018-07-26 DIAGNOSIS — E876 Hypokalemia: Secondary | ICD-10-CM

## 2018-07-26 MED ORDER — POTASSIUM CHLORIDE CRYS ER 20 MEQ PO TBCR: 20.0000 meq | EXTENDED_RELEASE_TABLET | Freq: Two times a day (BID) | ORAL | 3 refills | Status: DC

## 2018-07-26 NOTE — Telephone Encounter (Signed)
Notes recorded by Frederik Schmidt, RN on 07/26/2018 at 12:27 PM EDT Informed patient of results/recommendations. She verbalized understanding. ------

## 2018-07-26 NOTE — Telephone Encounter (Signed)
-----   Message from Dorris Carnes V, MD sent at 07/24/2018 10:29 PM EDT ----- BNP is elevated compared to one 9 days ago Watch fluid and salt intake    Add BNP to labs when she comes in again for BMET

## 2018-07-27 ENCOUNTER — Telehealth: Payer: Self-pay

## 2018-07-27 DIAGNOSIS — E876 Hypokalemia: Secondary | ICD-10-CM

## 2018-07-27 NOTE — Telephone Encounter (Signed)
Notes recorded by Frederik Schmidt, RN on 07/27/2018 at 9:36 AM EDT Informed patient of results/recommendations. She verbalized understanding. ------

## 2018-07-27 NOTE — Telephone Encounter (Signed)
-----   Message from Liliane Shi, [place] sent at 07/23/2018  1:43 PM EDT ----- The following abnormalities are noted:  The potassium is low. Creatinine is stable.  All other values are normal, stable or within acceptable limits. Medication changes / Follow up labs / Other changes or recommendations:   - Increase K+ to 20 mEq Twice daily  - Repeat BMET 2 weeks. [name], PA-C 07/23/2018 1:41 PM

## 2018-07-29 ENCOUNTER — Ambulatory Visit: Payer: Medicare HMO | Admitting: Vascular Surgery

## 2018-07-29 ENCOUNTER — Encounter (HOSPITAL_COMMUNITY): Payer: Medicare HMO

## 2018-07-30 DIAGNOSIS — I5033 Acute on chronic diastolic (congestive) heart failure: Secondary | ICD-10-CM | POA: Diagnosis not present

## 2018-08-06 DIAGNOSIS — I5032 Chronic diastolic (congestive) heart failure: Secondary | ICD-10-CM | POA: Diagnosis not present

## 2018-08-06 DIAGNOSIS — J449 Chronic obstructive pulmonary disease, unspecified: Secondary | ICD-10-CM | POA: Diagnosis not present

## 2018-08-09 ENCOUNTER — Other Ambulatory Visit: Payer: Medicare HMO

## 2018-08-10 ENCOUNTER — Other Ambulatory Visit: Payer: Medicare HMO

## 2018-08-10 ENCOUNTER — Telehealth: Payer: Self-pay

## 2018-08-10 DIAGNOSIS — E876 Hypokalemia: Secondary | ICD-10-CM

## 2018-08-10 DIAGNOSIS — E8770 Fluid overload, unspecified: Secondary | ICD-10-CM | POA: Diagnosis not present

## 2018-08-10 LAB — BASIC METABOLIC PANEL
BUN/Creatinine Ratio: 25 (ref 12–28)
BUN: 46 mg/dL — ABNORMAL HIGH (ref 8–27)
CO2: 33 mmol/L — ABNORMAL HIGH (ref 20–29)
Calcium: 9.5 mg/dL (ref 8.7–10.3)
Chloride: 86 mmol/L — ABNORMAL LOW (ref 96–106)
Creatinine, Ser: 1.85 mg/dL — ABNORMAL HIGH (ref 0.57–1.00)
GFR calc Af Amer: 30 mL/min/{1.73_m2} — ABNORMAL LOW (ref >59)
GFR calc non Af Amer: 26 mL/min/{1.73_m2} — ABNORMAL LOW (ref >59)
Glucose: 94 mg/dL (ref 65–99)
Potassium: 2.9 mmol/L — CL (ref 3.5–5.2)
Sodium: 137 mmol/L (ref 134–144)

## 2018-08-10 LAB — PRO B NATRIURETIC PEPTIDE: NT-Pro BNP: 1044 pg/mL — ABNORMAL HIGH (ref 0–738)

## 2018-08-10 NOTE — Telephone Encounter (Signed)
Called patient earlier to verify how she had been taking her lasix and potassium. Patient states that her medicines come in a pre-packaged bubble pack prepared from her pharmacy. She states that she thinks that she has been taking lasix 60 mg AM and 40 mg PM and potassium 20 mEq QD. Patient was reading from her med list.   [place] who prepares the patient's medicines. Spoke with Allie who prepares the patient's medication packs. Fanny Skates states that she prepares the patient's meds 1 week before she is due to run out for a month at a time. She states that all of the patient's AM meds are in one big bubble pack and all of the PM meds are in one big bubble pack together. She states that she had prepared all of the patient's packs with  lasix 60 mg AM and 40 mg PM and potassium 20 mEq QD. She states that she received the med changes on the same day that she prepared them and made the appropriate changes (lasix 40 mg BID and potassium 20 mg BID). Fanny Skates states that the patient's son picked up the patient's meds on 7/29. (Note additional med changes were made from 8/1 lab results, see below)

## 2018-08-10 NOTE — Telephone Encounter (Signed)
Reviewed all of the information below with DOD, Dr. Tamala Julian.  Verbal Orders: -Hold lasix x 2 days, then resume at 40 mg BID -Take potassium 40 mEq BID x 1 day, then take 20 mg BID -Repeat BMET Friday or Monday -Forward to Dr. Harrington Challenger for further recommendations  Called and spoke to [facility] again at [facility]. Made her aware of the recommendations above and that we will probably be making multiple changes to patient's meds until she normalizes. Made her aware that it is not ideal that her lasix and potassium be bubbled together with all of her other medicines. Fanny Skates states that she is going to put the patient's lasix and potassium in two different bottles, separate from her bubble packed meds. She states that the patient can bring her bubble packs to the pharmacy and they can take out the lasix and potassium.   Attempted to contact patient's son again, but was unable to reach him. Called and spoke to patient and made her aware of information above. Patient requesting that I contact her son to help her. Made patient aware that I have already tried to reach him but have been unsuccessful. Instructed for patient to try and reach out to her son and have him call our office first thing in the morning and reach out to her pharmacy as well for med changes. Patient verbalized understanding.  Attempted to reach the son again, but there was no answer. Will forward to Dr. Harrington Challenger and her RN to follow.

## 2018-08-10 NOTE — Telephone Encounter (Signed)
Call received from Santiago Glad at [place] at [place] PM with critical lab result of Potassium 2.6.

## 2018-08-10 NOTE — Telephone Encounter (Signed)
Reviewed labs.  8/20  8/1  7/25 K- 2.9  K-3.4  K-3.4 Cr-1.85 Cr- 1.05 Cr-1.17 BNP- 1,044 BNP- 1,217 BNP- 679  7/25 Instruction:   -Decrease lasix to 40 mg BID (previously taking 60 mg AM, 40 mg PM)  -Take extra 40 mEq of potassium x1 (takes 20 mEq QD)  -Repeat BMET 1 week  8/1 Instruction:  -No changes to lasix (takes 40 mg BID)  -Increase potassium to 20 mEq BID (previously taking 20 mEq QD)  -Repeat BMET 2 weeks

## 2018-08-11 NOTE — Telephone Encounter (Signed)
Spoke with patient who went to pharmacy this am. Spoke with Aileen Fass at pharmacy. They adjusted patient's packs of pills so that lasix is held for 2 days. They did not adjust packets for potassium.  Currently the packs are set up with one 20 meq tablet, pt takes second dose every day from a separate bottle.  Called patient back and instructed that TODAY ONLY she take 3 potassium tablets from the bottle instead of the usual ONE tablet.  Pt verbalizes understanding.  Alie at pharmacy asked that after patient's labs are resulted call them back to let them know if there are further medication changes.  Her next month packs are prepared but won't be provided to patient until after next week.

## 2018-08-16 ENCOUNTER — Other Ambulatory Visit: Payer: Medicare HMO | Admitting: *Deleted

## 2018-08-16 DIAGNOSIS — E876 Hypokalemia: Secondary | ICD-10-CM | POA: Diagnosis not present

## 2018-08-16 LAB — BASIC METABOLIC PANEL
BUN/Creatinine Ratio: 19 (ref 12–28)
BUN: 27 mg/dL (ref 8–27)
CO2: 32 mmol/L — ABNORMAL HIGH (ref 20–29)
Calcium: 9 mg/dL (ref 8.7–10.3)
Chloride: 92 mmol/L — ABNORMAL LOW (ref 96–106)
Creatinine, Ser: 1.45 mg/dL — ABNORMAL HIGH (ref 0.57–1.00)
GFR calc Af Amer: 40 mL/min/{1.73_m2} — ABNORMAL LOW (ref >59)
GFR calc non Af Amer: 35 mL/min/{1.73_m2} — ABNORMAL LOW (ref >59)
Glucose: 93 mg/dL (ref 65–99)
Potassium: 3 mmol/L — ABNORMAL LOW (ref 3.5–5.2)
Sodium: 141 mmol/L (ref 134–144)

## 2018-08-17 ENCOUNTER — Telehealth: Payer: Self-pay | Admitting: *Deleted

## 2018-08-17 DIAGNOSIS — I5032 Chronic diastolic (congestive) heart failure: Secondary | ICD-10-CM

## 2018-08-17 MED ORDER — FUROSEMIDE 40 MG PO TABS: 40.0000 mg | ORAL_TABLET | Freq: Every day | ORAL | 3 refills | Status: DC

## 2018-08-17 NOTE — Telephone Encounter (Signed)
Patient informed. Will decrease lasix to 1 tablet (40 mg) daily and potassium to 2 pills a day (40 meq) Will be out of town all of next week. Will come on 08/28/18 for labs. No swelling today.  Much improved from last week.  No SOB. She is aware to call if increased swelling/any SOB in the meantime.

## 2018-08-17 NOTE — Telephone Encounter (Signed)
-----   Message from Fay Records, MD sent at 08/16/2018 11:15 PM EDT ----- Potassium is still low    Would give 40 KCL now. Take 40 lasix daily (not 2x per day ) and 40 KCL      Check BMET and BNP in 1 week How is she feeling (swelling, breathing)?

## 2018-08-30 ENCOUNTER — Other Ambulatory Visit: Payer: Medicare HMO | Admitting: *Deleted

## 2018-08-30 DIAGNOSIS — I5032 Chronic diastolic (congestive) heart failure: Secondary | ICD-10-CM | POA: Diagnosis not present

## 2018-08-31 DIAGNOSIS — I951 Orthostatic hypotension: Secondary | ICD-10-CM | POA: Diagnosis not present

## 2018-08-31 LAB — BASIC METABOLIC PANEL
BUN/Creatinine Ratio: 16 (ref 12–28)
BUN: 27 mg/dL (ref 8–27)
CO2: 35 mmol/L — ABNORMAL HIGH (ref 20–29)
Calcium: 9.5 mg/dL (ref 8.7–10.3)
Chloride: 87 mmol/L — ABNORMAL LOW (ref 96–106)
Creatinine, Ser: 1.66 mg/dL — ABNORMAL HIGH (ref 0.57–1.00)
GFR calc Af Amer: 34 mL/min/{1.73_m2} — ABNORMAL LOW (ref >59)
GFR calc non Af Amer: 29 mL/min/{1.73_m2} — ABNORMAL LOW (ref >59)
Glucose: 79 mg/dL (ref 65–99)
Potassium: 3.6 mmol/L (ref 3.5–5.2)
Sodium: 140 mmol/L (ref 134–144)

## 2018-08-31 LAB — PRO B NATRIURETIC PEPTIDE: NT-Pro BNP: 613 pg/mL (ref 0–738)

## 2018-09-02 ENCOUNTER — Telehealth: Payer: Self-pay | Admitting: *Deleted

## 2018-09-02 DIAGNOSIS — I5032 Chronic diastolic (congestive) heart failure: Secondary | ICD-10-CM

## 2018-09-02 DIAGNOSIS — R42 Dizziness and giddiness: Secondary | ICD-10-CM

## 2018-09-02 MED ORDER — FUROSEMIDE 40 MG PO TABS: 40.0000 mg | ORAL_TABLET | ORAL | 3 refills | Status: DC

## 2018-09-02 MED ORDER — POTASSIUM CHLORIDE CRYS ER 20 MEQ PO TBCR: 40.0000 meq | EXTENDED_RELEASE_TABLET | ORAL | 3 refills | Status: DC

## 2018-09-02 NOTE — Telephone Encounter (Signed)
-----   Message from Fay Records, MD sent at 09/01/2018 10:59 PM EDT ----- Potassium is normal Kidney function is down from 3 month ago   Patient may not need so much lasix Recomm:   Cut back to 40 mg every other day   Only take possium on days she takes lasix (qod)  F/U BMET and BNP in 10 to 14 days

## 2018-09-02 NOTE — Telephone Encounter (Signed)
Was instructed by PCP Tuesday to hold lasix until Saturday.  She saw him because of fatigue and dizziness.  Starting to feel better. Advised patient to hold potassium while holding lasix and on Saturday restart potassium and lasix and take both every other day only per Dr. Harrington Challenger. She will return to office on 9/20 to repeat BMET and get BNP. Medication list updated.

## 2018-09-06 DIAGNOSIS — J449 Chronic obstructive pulmonary disease, unspecified: Secondary | ICD-10-CM | POA: Diagnosis not present

## 2018-09-09 DIAGNOSIS — I10 Essential (primary) hypertension: Secondary | ICD-10-CM | POA: Diagnosis not present

## 2018-09-09 DIAGNOSIS — E559 Vitamin D deficiency, unspecified: Secondary | ICD-10-CM | POA: Diagnosis not present

## 2018-09-09 DIAGNOSIS — Z79899 Other long term (current) drug therapy: Secondary | ICD-10-CM | POA: Diagnosis not present

## 2018-09-09 DIAGNOSIS — Z23 Encounter for immunization: Secondary | ICD-10-CM | POA: Diagnosis not present

## 2018-09-09 DIAGNOSIS — I25810 Atherosclerosis of coronary artery bypass graft(s) without angina pectoris: Secondary | ICD-10-CM | POA: Diagnosis not present

## 2018-09-09 DIAGNOSIS — Z5181 Encounter for therapeutic drug level monitoring: Secondary | ICD-10-CM | POA: Diagnosis not present

## 2018-09-09 DIAGNOSIS — G894 Chronic pain syndrome: Secondary | ICD-10-CM | POA: Diagnosis not present

## 2018-09-09 DIAGNOSIS — Z0000 Encounter for general adult medical examination without abnormal findings: Secondary | ICD-10-CM | POA: Diagnosis not present

## 2018-09-09 DIAGNOSIS — F330 Major depressive disorder, recurrent, mild: Secondary | ICD-10-CM | POA: Diagnosis not present

## 2018-09-09 DIAGNOSIS — D649 Anemia, unspecified: Secondary | ICD-10-CM | POA: Diagnosis not present

## 2018-09-09 DIAGNOSIS — M109 Gout, unspecified: Secondary | ICD-10-CM | POA: Diagnosis not present

## 2018-09-09 DIAGNOSIS — E7800 Pure hypercholesterolemia, unspecified: Secondary | ICD-10-CM | POA: Diagnosis not present

## 2018-09-09 DIAGNOSIS — E039 Hypothyroidism, unspecified: Secondary | ICD-10-CM | POA: Diagnosis not present

## 2018-09-09 DIAGNOSIS — Z1389 Encounter for screening for other disorder: Secondary | ICD-10-CM | POA: Diagnosis not present

## 2018-09-09 DIAGNOSIS — J449 Chronic obstructive pulmonary disease, unspecified: Secondary | ICD-10-CM | POA: Diagnosis not present

## 2018-09-10 ENCOUNTER — Other Ambulatory Visit: Payer: Medicare HMO | Admitting: *Deleted

## 2018-09-10 DIAGNOSIS — R42 Dizziness and giddiness: Secondary | ICD-10-CM | POA: Diagnosis not present

## 2018-09-10 DIAGNOSIS — I5032 Chronic diastolic (congestive) heart failure: Secondary | ICD-10-CM

## 2018-09-11 LAB — BASIC METABOLIC PANEL
BUN/Creatinine Ratio: 24 (ref 12–28)
BUN: 32 mg/dL — ABNORMAL HIGH (ref 8–27)
CO2: 32 mmol/L — ABNORMAL HIGH (ref 20–29)
Calcium: 8.7 mg/dL (ref 8.7–10.3)
Chloride: 94 mmol/L — ABNORMAL LOW (ref 96–106)
Creatinine, Ser: 1.34 mg/dL — ABNORMAL HIGH (ref 0.57–1.00)
GFR calc Af Amer: 44 mL/min/{1.73_m2} — ABNORMAL LOW (ref >59)
GFR calc non Af Amer: 38 mL/min/{1.73_m2} — ABNORMAL LOW (ref >59)
Glucose: 92 mg/dL (ref 65–99)
Potassium: 3 mmol/L — ABNORMAL LOW (ref 3.5–5.2)
Sodium: 141 mmol/L (ref 134–144)

## 2018-09-11 LAB — PRO B NATRIURETIC PEPTIDE: NT-Pro BNP: 417 pg/mL (ref 0–738)

## 2018-09-12 ENCOUNTER — Telehealth: Payer: Self-pay | Admitting: Internal Medicine

## 2018-09-12 NOTE — Telephone Encounter (Signed)
Patient needs repeat BMET in 1 week with magnesium

## 2018-09-15 ENCOUNTER — Telehealth: Payer: Self-pay | Admitting: Internal Medicine

## 2018-09-15 DIAGNOSIS — I5032 Chronic diastolic (congestive) heart failure: Secondary | ICD-10-CM

## 2018-09-15 DIAGNOSIS — E876 Hypokalemia: Secondary | ICD-10-CM

## 2018-09-15 MED ORDER — POTASSIUM CHLORIDE CRYS ER 20 MEQ PO TBCR: EXTENDED_RELEASE_TABLET | ORAL | 3 refills | Status: DC

## 2018-09-15 NOTE — Telephone Encounter (Signed)
New Message:      Pt is returning a call from 09/13/18

## 2018-09-15 NOTE — Telephone Encounter (Signed)
Notes recorded by Fay Records, MD on 09/12/2018 at 11:04 PM EDT Kidney function is better  Potassium is a little low   Recomm:  Take extra 40 now   Then take 40 on days of lasix, 20 meq on days not taking lasic    _____________________________________________________________  Patient returned call. Informed of recommendations, she wrote directions for potassium to be sure she follows correctly. Will take 40 meq extra today. Will come next Thursday for BMET and Magnesium ------  Potassium 40 meq on lasix days, 20 meq on all other days

## 2018-09-21 ENCOUNTER — Other Ambulatory Visit: Payer: Self-pay

## 2018-09-21 DIAGNOSIS — K551 Chronic vascular disorders of intestine: Secondary | ICD-10-CM

## 2018-09-23 ENCOUNTER — Other Ambulatory Visit: Payer: Medicare HMO | Admitting: *Deleted

## 2018-09-23 ENCOUNTER — Telehealth: Payer: Self-pay | Admitting: *Deleted

## 2018-09-23 DIAGNOSIS — I5032 Chronic diastolic (congestive) heart failure: Secondary | ICD-10-CM | POA: Diagnosis not present

## 2018-09-23 DIAGNOSIS — E876 Hypokalemia: Secondary | ICD-10-CM | POA: Diagnosis not present

## 2018-09-23 LAB — BASIC METABOLIC PANEL
BUN/Creatinine Ratio: 18 (ref 12–28)
BUN: 21 mg/dL (ref 8–27)
CO2: 33 mmol/L — ABNORMAL HIGH (ref 20–29)
Calcium: 9.6 mg/dL (ref 8.7–10.3)
Chloride: 92 mmol/L — ABNORMAL LOW (ref 96–106)
Creatinine, Ser: 1.17 mg/dL — ABNORMAL HIGH (ref 0.57–1.00)
GFR calc Af Amer: 52 mL/min/{1.73_m2} — ABNORMAL LOW (ref >59)
GFR calc non Af Amer: 45 mL/min/{1.73_m2} — ABNORMAL LOW (ref >59)
Glucose: 84 mg/dL (ref 65–99)
Potassium: 2.9 mmol/L — CL (ref 3.5–5.2)
Sodium: 141 mmol/L (ref 134–144)

## 2018-09-23 LAB — MAGNESIUM: Magnesium: 3.1 mg/dL — ABNORMAL HIGH (ref 1.6–2.3)

## 2018-09-23 NOTE — Telephone Encounter (Signed)
-----   Message from Fay Records, MD sent at 09/23/2018  4:15 PM EDT ----- Stop lasix Give additional day of 40 KCL then stop Check BMET on Tuesday

## 2018-09-23 NOTE — Telephone Encounter (Signed)
Discontinued lasix 40 mg every other day and Potassium 20 daily alt with 40 daily after today's dose of potassium and an additional 40 meq.  Pt is aware and in agreement to stop both medicines.  She does not have swelling currently. Will come for lab work on [employer]).

## 2018-09-28 ENCOUNTER — Other Ambulatory Visit: Payer: Medicare HMO

## 2018-09-28 DIAGNOSIS — E876 Hypokalemia: Secondary | ICD-10-CM

## 2018-09-29 ENCOUNTER — Other Ambulatory Visit: Payer: Self-pay | Admitting: Medical

## 2018-09-29 ENCOUNTER — Telehealth: Payer: Self-pay | Admitting: Medical

## 2018-09-29 DIAGNOSIS — E876 Hypokalemia: Secondary | ICD-10-CM

## 2018-09-29 DIAGNOSIS — I5032 Chronic diastolic (congestive) heart failure: Secondary | ICD-10-CM

## 2018-09-29 LAB — BASIC METABOLIC PANEL
BUN/Creatinine Ratio: 18 (ref 12–28)
BUN: 17 mg/dL (ref 8–27)
CO2: 27 mmol/L (ref 20–29)
Calcium: 9.6 mg/dL (ref 8.7–10.3)
Chloride: 99 mmol/L (ref 96–106)
Creatinine, Ser: 0.93 mg/dL (ref 0.57–1.00)
GFR calc Af Amer: 68 mL/min/{1.73_m2} (ref >59)
GFR calc non Af Amer: 59 mL/min/{1.73_m2} — ABNORMAL LOW (ref >59)
Glucose: 93 mg/dL (ref 65–99)
Potassium: 2.7 mmol/L — CL (ref 3.5–5.2)
Sodium: 144 mmol/L (ref 134–144)

## 2018-09-29 MED ORDER — SPIRONOLACTONE 25 MG PO TABS: 25.0000 mg | ORAL_TABLET | Freq: Every day | ORAL | 3 refills | Status: DC

## 2018-09-29 NOTE — Telephone Encounter (Addendum)
Notified by outpatient labs that patient had a potassium of 2.7. Recently instructed to increase her home potassium, currently on 20 mEq BID. Also reported some increased LE edema since stopping her lasix 2/2 hypokalemia. Discussed with Dr. Harrington Challenger. Decision made to increase potassium to 40 mEq BID for 3 days, then resume 20 mEq BID. Will start spironolactone 25mg  daily at this time. She was instructed to present to our [place] office on Monday 10/04/18 for a BMET to monitor her potassium level. Appointment scheduled to see Robbie Lis, PA-C 10/15 at 8am. Patient in agreement with the plan.

## 2018-10-05 ENCOUNTER — Ambulatory Visit: Payer: Medicare HMO | Admitting: Physician Assistant

## 2018-10-05 VITALS — BP 138/70 | HR 91 | Ht 68.0 in | Wt 161.0 lb

## 2018-10-05 DIAGNOSIS — E876 Hypokalemia: Secondary | ICD-10-CM

## 2018-10-05 DIAGNOSIS — R0602 Shortness of breath: Secondary | ICD-10-CM | POA: Diagnosis not present

## 2018-10-05 DIAGNOSIS — K551 Chronic vascular disorders of intestine: Secondary | ICD-10-CM

## 2018-10-05 DIAGNOSIS — I5033 Acute on chronic diastolic (congestive) heart failure: Secondary | ICD-10-CM

## 2018-10-05 DIAGNOSIS — R609 Edema, unspecified: Secondary | ICD-10-CM

## 2018-10-05 DIAGNOSIS — I2510 Atherosclerotic heart disease of native coronary artery without angina pectoris: Secondary | ICD-10-CM

## 2018-10-05 DIAGNOSIS — Z86711 Personal history of pulmonary embolism: Secondary | ICD-10-CM

## 2018-10-05 LAB — CBC
Hematocrit: 33.8 % — ABNORMAL LOW (ref 34.0–46.6)
Hemoglobin: 10.9 g/dL — ABNORMAL LOW (ref 11.1–15.9)
MCH: 27.7 pg (ref 26.6–33.0)
MCHC: 32.2 g/dL (ref 31.5–35.7)
MCV: 86 fL (ref 79–97)
Platelets: 267 10*3/uL (ref 150–450)
RBC: 3.94 x10E6/uL (ref 3.77–5.28)
RDW: 17.5 % — ABNORMAL HIGH (ref 12.3–15.4)
WBC: 8.1 10*3/uL (ref 3.4–10.8)

## 2018-10-05 LAB — COMPREHENSIVE METABOLIC PANEL
ALT: <5 IU/L (ref 0–32)
AST: 9 IU/L (ref 0–40)
Albumin/Globulin Ratio: 1.5 (ref 1.2–2.2)
Albumin: 3.8 g/dL (ref 3.5–4.8)
Alkaline Phosphatase: 87 IU/L (ref 39–117)
BUN/Creatinine Ratio: 14 (ref 12–28)
BUN: 14 mg/dL (ref 8–27)
Bilirubin Total: 0.2 mg/dL (ref 0.0–1.2)
CO2: 33 mmol/L — ABNORMAL HIGH (ref 20–29)
Calcium: 9.3 mg/dL (ref 8.7–10.3)
Chloride: 100 mmol/L (ref 96–106)
Creatinine, Ser: 1 mg/dL (ref 0.57–1.00)
GFR calc Af Amer: 62 mL/min/{1.73_m2} (ref >59)
GFR calc non Af Amer: 54 mL/min/{1.73_m2} — ABNORMAL LOW (ref >59)
Globulin, Total: 2.5 g/dL (ref 1.5–4.5)
Glucose: 90 mg/dL (ref 65–99)
Potassium: 3.3 mmol/L — ABNORMAL LOW (ref 3.5–5.2)
Sodium: 141 mmol/L (ref 134–144)
Total Protein: 6.3 g/dL (ref 6.0–8.5)

## 2018-10-05 LAB — SEDIMENTATION RATE: Sed Rate: 23 mm/hr (ref 0–40)

## 2018-10-05 LAB — MAGNESIUM: Magnesium: 2.1 mg/dL (ref 1.6–2.3)

## 2018-10-05 NOTE — Progress Notes (Signed)
Cardiology Office Note    Date:  10/05/2018   ID:  [name], DOB Aug 31, 1941, MRN [id number]  PCP:  [name], MD  Cardiologist: Dr. Harrington Challenger  Chief Complaint: LE edema and hypokalemia  History of Present Illness:   [name] is a 77 y.o. female CAD, carotid artery disease, chronic diastolic CHF, recurrent DVTs, PE s/p IVC filter,  UGI bleed, HTN, HLD and PVD followed by Vascular presents for follow up.  Hx of CAD s/p CABG 2004 and DES to L [place]. Last LHC 11/12 showed instent restenosis to the LM. LCX was occluded in prox segment. SVG to OM was patient as well as LAD RCA had signif disease but appeared nondominant . The patient was admitted to [facility] in July 2013 with CP. Lexiscan Myoview showed localized ischemia in the inferior wall (base, mid) Ranexa was added. Pt with history of UGI bleed >> EGD with gastric erosions, AVMs in SB.  Colonoscopy with pandiverticulits.  Anticoag d/c'd.   In March 2019, she was admitted with pulmonary emboli and acute DVT in L femoral vein along with ischemic colitis.   Found to have a SMA stenosis and underwent stentingin bg by vascular surgery.   Hosp with acute anemia due to abdominal hematoma on heparin.  IVC filter placed.  Also underwent paracentesis for ascites.  CT with cirrhosis.  Recently had a potassium of 2.7. Recently instructed to increase her home potassium, currently on 20 mEq BID. Also reported some increased LE edema since stopping her lasix 2/2 hypokalemia. Discussed with Dr. Harrington Challenger. Decision made to increase potassium to 40 mEq BID for 3 days, then resume 20 mEq BID. stareted spironolactone 25mg  daily.  Here today for follow up. She continued to take potassium 40mg  BID last last instruction 10/8 along with spironolactone 25mg  qd. No improvement on her symptoms. She continues to have DOE and LE edema. No change in symptoms since PE yearly this year. No exertional chest pain or pressure. Denies orthopnea or PND.   Past  Medical History:  Diagnosis Date  . Anemia    bld. transfusion post lumbar surgery- 2012  . Anxiety   . Arthralgia    NOS  . Blood transfusion 2011   WITH BACK SURGERY  . CAD (coronary artery disease)    s/p CABG 2004; s/p DES to LM in 2010;  [place] 10/29/11: EF 50-55%, mild elevated filling pressures, no pulmonary HTN, LM 90% ISR, LAD and CFX occluded, S-RI occluded (old), S-OM3 ok and L-LAD ok, native nondominant RCA 95% -  med rx recommended ; Lexiscan Myoview 7/13 at [facility]: demonstrated "normal LV function, anterior attenuation and localized ischemia, inferior, basilar, mid section"  . Carotid artery disease ([place])    Carotid US 3/15:  RICA 4-00; LICA 86-76; R subclavian stenosis - Repeat 1 year.  . CHF (congestive heart failure) ([place])   . Chronic diastolic heart failure (HCC)    Echo 9/10: EF 19-50%, grade 1 diastolic dysfunction  . COPD (chronic obstructive pulmonary disease) ([place])    Emphysema dxed by Dr. Woody Seller in [place] based on PFTs per pt in 2006; placed on albuterol  . Depression    TAKES CELEXA  AND  (OFF- WELBUTRIN)  . DVT of lower extremity (deep venous thrombosis) (HCC)    recurrent. bilateral (2 episodes)  . Dyspnea   . Dysrhythmia    afib with cabg  . Exertional angina (HCC)    Treated with Isosorbide, Ranexa, amlodipine; intolerant to metoprolol  .  GERD (gastroesophageal reflux disease)   . HLD (hyperlipidemia)   . Hypertension   . Hyperthyroidism   . Obesity (BMI 30-39.9) 2009   BMI 33  . Osteoarthritis   . Oxygen deficiency   . Oxygen dependent    oses 4L at home as needed   . Pneumonia   . PVD (peripheral vascular disease) ([place])   . Sleep apnea 2012   USED CPAP THEN  STARTED USING SPIRIVA , Nov. 2013- last evaluation , changed from mask to aparatus that is just for her nose.. ; reports 05-28-18 "the mask smothers me so i dont use it right now"    Past Surgical History:  Procedure Laterality Date  . ANKLE SURGERY  2004  . [place]    lower; another scheduled, opt. reports 4 back- lumbar, 3 cerv. fusions  for later 2009  . BREAST ENHANCEMENT SURGERY    . CARDIAC CATHETERIZATION  11/2009   Patent LIMA to LAD and patent SVG to OM1. Occluded SVG to ramus and diagonal. Left main: 90% ostial stenosis, LCX 60-70% proximal stenosis  . CHOLECYSTECTOMY    . COLONOSCOPY WITH PROPOFOL N/A 02/17/2017   Procedure: COLONOSCOPY WITH PROPOFOL;  Surgeon: Jerene Bears, MD;  Location: [facility] ENDOSCOPY;  Service: Endoscopy;  Laterality: N/A;  . CORONARY ANGIOPLASTY WITH STENT PLACEMENT  11/2009   Drug eluting stent to left main artery: 4.0 X 12 mm Ion   . CORONARY ARTERY BYPASS GRAFT  2004  . ESOPHAGOGASTRODUODENOSCOPY N/A 02/14/2017   Procedure: ESOPHAGOGASTRODUODENOSCOPY (EGD);  Surgeon: Doran Stabler, MD;  Location: [facility] ENDOSCOPY;  Service: Endoscopy;  Laterality: N/A;  . EYE SURGERY  2006   BILATERAL CAT EXT.   Marland Kitchen GIVENS CAPSULE STUDY N/A 02/15/2017   Procedure: GIVENS CAPSULE STUDY;  Surgeon: Doran Stabler, MD;  Location: [place];  Service: Endoscopy;  Laterality: N/A;  . GROIN MASS OPEN BIOPSY  2004  . IR IVC FILTER PLMT / S&I /IMG GUID/MOD SED  03/14/2018  . IR PARACENTESIS  03/10/2018  . JOINT REPLACEMENT  2012    BILATERAL KNEES  . JOINT REPLACEMENT  2010    RIGHT HIP REPLACEMENT  . NECK SURGERY  12/08   Due for repeat neck surgery  . PERIPHERAL VASCULAR INTERVENTION Left 03/05/2018   Procedure: PERIPHERAL VASCULAR INTERVENTION;  Surgeon: Elam Dutch, MD;  Location: [place] CV LAB;  Service: Cardiovascular;  Laterality: Left;  Attempted unsuccess\ful Per Dr. Eden Lathe  . PERIPHERAL VASCULAR INTERVENTION  03/08/2018   Procedure: PERIPHERAL VASCULAR INTERVENTION;  Surgeon: Waynetta Sandy, MD;  Location: [place] CV LAB;  Service: Cardiovascular;;  SMA Stent   . REVERSE SHOULDER ARTHROPLASTY  02/26/2012   Procedure: REVERSE SHOULDER ARTHROPLASTY;  Surgeon: Marin Shutter, MD;  Location: [place];  Service:  Orthopedics;  Laterality: Right;  RIGHT SHOULDER REVERSED ARTHROPLASTY  . SHOULDER ARTHROSCOPY WITH SUBACROMIAL DECOMPRESSION Left 02/10/2013   Procedure: SHOULDER ARTHROSCOPY WITH SUBACROMIAL DECOMPRESSION DISTAL CLAVICLE RESECTION;  Surgeon: Marin Shutter, MD;  Location: [place];  Service: Orthopedics;  Laterality: Left;  DISTAL CLAVICLE RESECTION  . TOTAL HIP ARTHROPLASTY  2007   Right  . TRANSURETHRAL RESECTION OF BLADDER TUMOR N/A 05/31/2018   Procedure: TRANSURETHRAL RESECTION OF BLADDER TUMOR (TURBT);  Surgeon: Lucas Mallow, MD;  Location: [facility];  Service: Urology;  Laterality: N/A;  . VISCERAL ANGIOGRAPHY N/A 03/05/2018   Procedure: VISCERAL ANGIOGRAPHY;  Surgeon: Elam Dutch, MD;  Location: [place] CV LAB;  Service: Cardiovascular;  Laterality: N/A;  . VISCERAL ANGIOGRAPHY N/A 03/08/2018   Procedure: VISCERAL ANGIOGRAPHY;  Surgeon: Waynetta Sandy, MD;  Location: [place] CV LAB;  Service: Cardiovascular;  Laterality: N/A;    Current Medications: Prior to Admission medications   Medication Sig Start Date End Date Taking? Authorizing Provider  allopurinol (ZYLOPRIM) 100 MG tablet Take 100 mg by mouth daily. 01/30/17   [provider]  aspirin EC 81 MG tablet Take 81 mg by mouth daily.    [provider]  atorvastatin (LIPITOR) 20 MG tablet Take 1 tablet (20 mg total) by mouth daily. 05/19/18 08/17/18  [name] T, PA-C  citalopram (CELEXA) 20 MG tablet Take 30 mg by mouth daily.     [provider]  CVS D3 1000 units capsule Take 1,000 Units by mouth daily. 01/14/18   [provider]  cyclobenzaprine (FLEXERIL) 10 MG tablet Take 10 mg by mouth 3 (three) times daily as needed for muscle spasms.     [provider]  docusate sodium (COLACE) 100 MG capsule Take 100 mg by mouth daily.    [provider]  feeding supplement, ENSURE ENLIVE, (ENSURE ENLIVE) LIQD Take 237 mLs by mouth 3 (three) times daily between  meals. 03/26/18   Mikhail, Velta Addison, DO  ferrous sulfate 325 (65 FE) MG tablet Take 325 mg by mouth daily with breakfast.    [provider]  HYDROcodone-acetaminophen (NORCO) 5-325 MG tablet Take 1 tablet by mouth every 4 (four) hours as needed for moderate pain. 05/31/18   Lucas Mallow, MD  INCRUSE ELLIPTA 62.5 MCG/INH AEPB Inhale 1 puff into the lungs 2 (two) times daily.  11/30/16   [provider]  Ipratropium-Albuterol (COMBIVENT) 20-100 MCG/ACT AERS respimat Inhale 1 puff into the lungs every 6 (six) hours as needed for wheezing.     [provider]  ipratropium-albuterol (DUONEB) 0.5-2.5 (3) MG/3ML SOLN Take 3 mLs by nebulization 4 (four) times daily as needed (for shortness of breath).     [provider]  magnesium oxide (MAG-OX) 400 (241.3 Mg) MG tablet Take 1 tablet (400 mg total) by mouth 2 (two) times daily. 03/26/18   Mikhail, Velta Addison, DO  nitroGLYCERIN (NITROSTAT) 0.4 MG SL tablet Place 1 tablet (0.4 mg total) under the tongue every 5 (five) minutes as needed for chest pain. For chest pain 04/24/16   Fay Records, MD  omeprazole (PRILOSEC) 40 MG capsule Take 40 mg by mouth 2 (two) times daily.     [provider]  Oxycodone HCl 10 MG TABS Take 1 tablet (10 mg total) by mouth every 6 (six) hours as needed. Patient taking differently: Take 10 mg by mouth every 6 (six) hours as needed (for pain).  04/05/18   Nita Sells, MD  pilocarpine (PILOCAR) 1 % ophthalmic solution Place 1 drop into both eyes 2 (two) times daily.    [provider]  pramipexole (MIRAPEX) 0.25 MG tablet Take 0.25 mg by mouth at bedtime. 01/14/18   [provider]  roflumilast (DALIRESP) 500 MCG TABS tablet Take 500 mcg by mouth daily.     [provider]  spironolactone (ALDACTONE) 25 MG tablet Take 1 tablet (25 mg total) by mouth daily. 09/29/18 09/29/19  Kroeger, Lorelee Cover., PA-C  traZODone (DESYREL) 50 MG tablet Take 150 mg by mouth at  bedtime.     [provider]    Allergies:   Zofran [ondansetron hcl]; Zolpidem tartrate; Ativan [lorazepam]; Pitavastatin; Ropinirole; and Penicillins   Social  History   Socioeconomic History  . Marital status: Widowed    Spouse name: Not on file  . Number of children: 3  . Years of education: Not on file  . Highest education level: Not on file  Occupational History  . Occupation: retired    Comment: Electronics engineer: RETIRED  Social Needs  . Financial resource strain: Not on file  . Food insecurity:    Worry: Not on file    Inability: Not on file  . Transportation needs:    Medical: Not on file    Non-medical: Not on file  Tobacco Use  . Smoking status: Former Smoker    Years: 45.00    Last attempt to quit: 12/22/2000    Years since quitting: 17.7  . Smokeless tobacco: Never Used  Substance and Sexual Activity  . Alcohol use: No    Alcohol/week: 0.0 standard drinks  . Drug use: No  . Sexual activity: Never  Lifestyle  . Physical activity:    Days per week: Not on file    Minutes per session: Not on file  . Stress: Not on file  Relationships  . Social connections:    Talks on phone: Not on file    Gets together: Not on file    Attends religious service: Not on file    Active member of club or organization: Not on file    Attends meetings of clubs or organizations: Not on file    Relationship status: Not on file  Other Topics Concern  . Not on file  Social History Narrative      3 grown children.     Family History:  The patient's family history includes COPD in her father; Cancer in her mother, sister, and unknown relative; Colitis in her unknown relative; Heart disease in her father, sister, and unknown relative; Lung cancer in her father; Ovarian cancer in her mother.   ROS:   Please see the history of present illness.    ROS All other systems reviewed and are negative.   PHYSICAL EXAM:   VS:  BP 138/70   Pulse 91   Ht 5\' 8"  (1.727  m)   Wt 161 lb (73 kg)   SpO2 94%   BMI 24.48 kg/m    GEN: Well nourished, well developed, in no acute distress  HEENT: normal  Neck: no JVD, carotid bruits, or masses Cardiac:  RRR; no murmurs, rubs, or gallops, 1+ LE edema  Respiratory: bibasilar rales  GI: soft, nontender, nondistended, + BS MS: no deformity or atrophy  Skin: warm and dry, no rash Neuro:  Alert and Oriented x 3, Strength and sensation are intact Psych: euthymic mood, full affect  Wt Readings from Last 3 Encounters:  10/05/18 161 lb (73 kg)  07/12/18 157 lb (71.2 kg)  07/01/18 157 lb (71.2 kg)      Studies/Labs Reviewed:   EKG:  EKG is not ordered today.    Recent Labs: 04/01/2018: B Natriuretic Peptide 194.0 05/28/2018: Hemoglobin 11.4; Platelets 324 07/01/2018: ALT 5 09/10/2018: NT-Pro BNP 417 09/23/2018: Magnesium 3.1 09/28/2018: BUN 17; Creatinine, Ser 0.93; Potassium 2.7; Sodium 144   Lipid Panel    Component Value Date/Time   CHOL 106 07/01/2018 0800   CHOL 98 05/23/2014 0857   TRIG 88 07/01/2018 0800   TRIG 83 05/23/2014 0857   HDL 38 (L) 07/01/2018 0800   HDL 35 (L) 05/23/2014 0857   CHOLHDL 2.8 07/01/2018 0800   CHOLHDL 4 03/03/2013  1136   VLDL 24.4 03/03/2013 1136   LDLCALC 50 07/01/2018 0800   LDLCALC 46 05/23/2014 0857    Additional studies/ records that were reviewed today include:   VAS [place] MESENTERIC 06/2018 FINAL INTERPRETATION: Largest Aortic Diameter: 2.7 cm  Mesenteric: Normal Celiac artery , Splenic artery, Hepatic artery and Inferior Mesenteric artery findings. 70 to 99% stenosis in the superior mesenteric artery.  Carotid doppler 05/2018 Final Interpretation: Right Carotid: Velocities in the right ICA are now consistent with a 1-39%        stenosis, high end range. Non-hemodynamically significant plaque        <50% noted in the CCA. The RICA velocities are elevated and have        decreased compared to the prior exam.  Left Carotid: Velocities  in the left ICA are now consistent with a 40-59%       stenosis. Non-hemodynamically significant plaque noted in the CCA.       The ECA appears >50% stenosed. The LICA velocities are elevated       and have decreased compared to the prior exam.  Vertebrals: Bilateral vertebral arteries demonstrate antegrade flow. Subclavians: Bilateral subclavian arteries were stenotic. Right subclavian       artery flow was disturbed.  Echocardiogram: 03/2018 Study Conclusions  - Left ventricle: The cavity size was normal. Wall thickness was   normal. Systolic function was normal. The estimated ejection   fraction was in the range of 55% to 60%. Wall motion was normal;   there were no regional wall motion abnormalities. The study is   not technically sufficient to allow evaluation of LV diastolic   function. - Mitral valve: Calcified annulus  Stress test 08/31/17  Nuclear stress EF: 65%.  Small region of distal anterior and apical ischemia, cannot exclude shifting breast attenuation. . Small anteroseptal defect consistent with probable soft tissue attenuation  This is a low risk study.   ASSESSMENT & PLAN:    1. Acute on chronic diastolic CHF/Acute on chronic DOE She continues to feel fatigue, dyspnea on exertion and lower extremity edema.  Currently on spironolactone 25 mg daily and potassium 40 mEq twice daily. Patient seen and examined by Dr. Harrington Challenger.  Last stress test 08/2017 was low risk.  Echocardiogram 03/2018 showed LV function of 55 to 60%.  Check below lab for complete work-up.  Will give further instruction based on resolution of lab.  Oxygen saturation was 90% with ambulation of about 100 ft. At home barely able to walk 50 feets.  Evidence of volume overload by exam.  2.  CAD post CABG and PCI -Denies chest pain.  Continue aspirin and statin.  3.  Mesenteric ischemia -Followed by vascular  4.  History of PE and recurrent DVT s/p IVC filter  5.  Hx of  Upper GI bleed -Denies any bleeding currently.   TSH was 6.1 on 08/2018.     Medication Adjustments/Labs and Tests Ordered: Current medicines are reviewed at length with the patient today.  Concerns regarding medicines are outlined above.  Medication changes, Labs and Tests ordered today are listed in the Patient Instructions below. Patient Instructions  Your physician recommends that you continue on your current medications as directed. Please refer to the Current Medication list given to you today.'   Your physician recommends that you return for lab work in:  STAT CBC, CMP, MAG, SED RATE ,ANA ,AND PRO BNP  Your physician recommends that you schedule a follow-up appointment in:  2 WEEKS  WITH VIN [address]    [place], [place]  10/05/2018 9:02 AM    [place] [place], [place], [place]  [address] Phone: [phone]; Fax: [phone]

## 2018-10-05 NOTE — Patient Instructions (Addendum)
Your physician recommends that you continue on your current medications as directed. Please refer to the Current Medication list given to you today.'   Your physician recommends that you return for lab work in:  STAT CBC, CMP, MAG, SED RATE ,ANA ,AND PRO BNP  Your physician recommends that you schedule a follow-up appointment in:  [address] [place]

## 2018-10-06 ENCOUNTER — Inpatient Hospital Stay (HOSPITAL_COMMUNITY)
Admission: AD | Admit: 2018-10-06 | Discharge: 2018-10-10 | DRG: 292 | Disposition: A | Discharge status: Home / Self Care | Payer: Medicare HMO | Source: Ambulatory Visit | Attending: Cardiovascular Disease | Admitting: Cardiovascular Disease

## 2018-10-06 ENCOUNTER — Telehealth: Payer: Self-pay | Admitting: Internal Medicine

## 2018-10-06 ENCOUNTER — Other Ambulatory Visit: Payer: Self-pay

## 2018-10-06 ENCOUNTER — Encounter (HOSPITAL_COMMUNITY): Payer: Self-pay | Admitting: General Practice

## 2018-10-06 DIAGNOSIS — Z95828 Presence of other vascular implants and grafts: Secondary | ICD-10-CM

## 2018-10-06 DIAGNOSIS — K559 Vascular disorder of intestine, unspecified: Secondary | ICD-10-CM | POA: Diagnosis not present

## 2018-10-06 DIAGNOSIS — T502X5A Adverse effect of carbonic-anhydrase inhibitors, benzothiadiazides and other diuretics, initial encounter: Secondary | ICD-10-CM | POA: Diagnosis present

## 2018-10-06 DIAGNOSIS — Z86718 Personal history of other venous thrombosis and embolism: Secondary | ICD-10-CM | POA: Diagnosis not present

## 2018-10-06 DIAGNOSIS — G4730 Sleep apnea, unspecified: Secondary | ICD-10-CM | POA: Diagnosis not present

## 2018-10-06 DIAGNOSIS — Z888 Allergy status to other drugs, medicaments and biological substances status: Secondary | ICD-10-CM

## 2018-10-06 DIAGNOSIS — J439 Emphysema, unspecified: Secondary | ICD-10-CM | POA: Diagnosis present

## 2018-10-06 DIAGNOSIS — K219 Gastro-esophageal reflux disease without esophagitis: Secondary | ICD-10-CM | POA: Diagnosis present

## 2018-10-06 DIAGNOSIS — Z86711 Personal history of pulmonary embolism: Secondary | ICD-10-CM | POA: Diagnosis not present

## 2018-10-06 DIAGNOSIS — I739 Peripheral vascular disease, unspecified: Secondary | ICD-10-CM | POA: Diagnosis not present

## 2018-10-06 DIAGNOSIS — Z880 Allergy status to penicillin: Secondary | ICD-10-CM | POA: Diagnosis not present

## 2018-10-06 DIAGNOSIS — E876 Hypokalemia: Secondary | ICD-10-CM

## 2018-10-06 DIAGNOSIS — I10 Essential (primary) hypertension: Secondary | ICD-10-CM | POA: Diagnosis present

## 2018-10-06 DIAGNOSIS — J449 Chronic obstructive pulmonary disease, unspecified: Secondary | ICD-10-CM | POA: Diagnosis not present

## 2018-10-06 DIAGNOSIS — E785 Hyperlipidemia, unspecified: Secondary | ICD-10-CM | POA: Diagnosis not present

## 2018-10-06 DIAGNOSIS — Z7982 Long term (current) use of aspirin: Secondary | ICD-10-CM

## 2018-10-06 DIAGNOSIS — K7460 Unspecified cirrhosis of liver: Secondary | ICD-10-CM | POA: Diagnosis present

## 2018-10-06 DIAGNOSIS — R0600 Dyspnea, unspecified: Secondary | ICD-10-CM

## 2018-10-06 DIAGNOSIS — I5033 Acute on chronic diastolic (congestive) heart failure: Secondary | ICD-10-CM | POA: Diagnosis present

## 2018-10-06 DIAGNOSIS — I110 Hypertensive heart disease with heart failure: Secondary | ICD-10-CM | POA: Diagnosis not present

## 2018-10-06 DIAGNOSIS — Z951 Presence of aortocoronary bypass graft: Secondary | ICD-10-CM

## 2018-10-06 DIAGNOSIS — I2510 Atherosclerotic heart disease of native coronary artery without angina pectoris: Secondary | ICD-10-CM | POA: Diagnosis not present

## 2018-10-06 DIAGNOSIS — Z96641 Presence of right artificial hip joint: Secondary | ICD-10-CM | POA: Diagnosis present

## 2018-10-06 DIAGNOSIS — F419 Anxiety disorder, unspecified: Secondary | ICD-10-CM | POA: Diagnosis present

## 2018-10-06 DIAGNOSIS — Z955 Presence of coronary angioplasty implant and graft: Secondary | ICD-10-CM

## 2018-10-06 DIAGNOSIS — I2699 Other pulmonary embolism without acute cor pulmonale: Secondary | ICD-10-CM | POA: Diagnosis present

## 2018-10-06 DIAGNOSIS — Z96653 Presence of artificial knee joint, bilateral: Secondary | ICD-10-CM | POA: Diagnosis present

## 2018-10-06 DIAGNOSIS — E039 Hypothyroidism, unspecified: Secondary | ICD-10-CM | POA: Diagnosis not present

## 2018-10-06 DIAGNOSIS — I25119 Atherosclerotic heart disease of native coronary artery with unspecified angina pectoris: Secondary | ICD-10-CM | POA: Diagnosis present

## 2018-10-06 DIAGNOSIS — Z881 Allergy status to other antibiotic agents status: Secondary | ICD-10-CM | POA: Diagnosis not present

## 2018-10-06 DIAGNOSIS — M1A9XX0 Chronic gout, unspecified, without tophus (tophi): Secondary | ICD-10-CM | POA: Diagnosis present

## 2018-10-06 DIAGNOSIS — Z79899 Other long term (current) drug therapy: Secondary | ICD-10-CM

## 2018-10-06 DIAGNOSIS — Z87891 Personal history of nicotine dependence: Secondary | ICD-10-CM

## 2018-10-06 HISTORY — DX: Low back pain, unspecified: M54.50

## 2018-10-06 HISTORY — DX: Hypokalemia: E87.6

## 2018-10-06 HISTORY — DX: Gout, unspecified: M10.9

## 2018-10-06 HISTORY — DX: Other chronic pain: G89.29

## 2018-10-06 HISTORY — DX: Low back pain: M54.5

## 2018-10-06 HISTORY — DX: Adverse effect of carbonic-anhydrase inhibitors, benzothiadiazides and other diuretics, initial encounter: T50.2X5A

## 2018-10-06 LAB — CBC WITH DIFFERENTIAL/PLATELET
Abs Immature Granulocytes: 0.08 10*3/uL — ABNORMAL HIGH (ref 0.00–0.07)
Basophils Absolute: 0.1 10*3/uL (ref 0.0–0.1)
Basophils Relative: 1 %
Eosinophils Absolute: 0.4 10*3/uL (ref 0.0–0.5)
Eosinophils Relative: 4 %
HCT: 36.1 % (ref 36.0–46.0)
Hemoglobin: 11.4 g/dL — ABNORMAL LOW (ref 12.0–15.0)
Immature Granulocytes: 1 %
Lymphocytes Relative: 29 %
Lymphs Abs: 2.5 10*3/uL (ref 0.7–4.0)
MCH: 28.1 pg (ref 26.0–34.0)
MCHC: 31.6 g/dL (ref 30.0–36.0)
MCV: 88.9 fL (ref 80.0–100.0)
Monocytes Absolute: 0.8 10*3/uL (ref 0.1–1.0)
Monocytes Relative: 10 %
Neutro Abs: 4.7 10*3/uL (ref 1.7–7.7)
Neutrophils Relative %: 55 %
Platelets: 284 10*3/uL (ref 150–400)
RBC: 4.06 MIL/uL (ref 3.87–5.11)
RDW: 16.7 % — ABNORMAL HIGH (ref 11.5–15.5)
WBC: 8.6 10*3/uL (ref 4.0–10.5)
nRBC: 0 % (ref 0.0–0.2)

## 2018-10-06 LAB — COMPREHENSIVE METABOLIC PANEL
ALT: 8 U/L (ref 0–44)
AST: 13 U/L — ABNORMAL LOW (ref 15–41)
Albumin: 3.2 g/dL — ABNORMAL LOW (ref 3.5–5.0)
Alkaline Phosphatase: 83 U/L (ref 38–126)
Anion gap: 12 (ref 5–15)
BUN: 18 mg/dL (ref 8–23)
CO2: 33 mmol/L — ABNORMAL HIGH (ref 22–32)
Calcium: 9.2 mg/dL (ref 8.9–10.3)
Chloride: 95 mmol/L — ABNORMAL LOW (ref 98–111)
Creatinine, Ser: 1.22 mg/dL — ABNORMAL HIGH (ref 0.44–1.00)
GFR calc Af Amer: 48 mL/min — ABNORMAL LOW (ref >60)
GFR calc non Af Amer: 41 mL/min — ABNORMAL LOW (ref >60)
Glucose, Bld: 88 mg/dL (ref 70–99)
Potassium: 2.9 mmol/L — ABNORMAL LOW (ref 3.5–5.1)
Sodium: 140 mmol/L (ref 135–145)
Total Bilirubin: 0.3 mg/dL (ref 0.3–1.2)
Total Protein: 6.9 g/dL (ref 6.5–8.1)

## 2018-10-06 LAB — PRO B NATRIURETIC PEPTIDE: NT-Pro BNP: 1048 pg/mL — ABNORMAL HIGH (ref 0–738)

## 2018-10-06 LAB — MAGNESIUM: Magnesium: 1.8 mg/dL (ref 1.7–2.4)

## 2018-10-06 LAB — ANA: Anti Nuclear Antibody(ANA): NEGATIVE

## 2018-10-06 LAB — BRAIN NATRIURETIC PEPTIDE: B Natriuretic Peptide: 124.2 pg/mL — ABNORMAL HIGH (ref 0.0–100.0)

## 2018-10-06 LAB — MRSA PCR SCREENING: MRSA by PCR: NEGATIVE

## 2018-10-06 MED ORDER — HYDROCODONE-ACETAMINOPHEN 5-325 MG PO TABS
1.0000 | ORAL_TABLET | ORAL | Status: DC | PRN
  Administered 2018-10-06 – 2018-10-09 (×5): 1 via ORAL
  Filled 2018-10-06 (×6): qty 1

## 2018-10-06 MED ORDER — IPRATROPIUM-ALBUTEROL 20-100 MCG/ACT IN AERS: 1.0000 | INHALATION_SPRAY | Freq: Four times a day (QID) | RESPIRATORY_TRACT | Status: DC | PRN

## 2018-10-06 MED ORDER — CYCLOBENZAPRINE HCL 10 MG PO TABS: 10.0000 mg | ORAL_TABLET | Freq: Three times a day (TID) | ORAL | Status: DC | PRN

## 2018-10-06 MED ORDER — UMECLIDINIUM BROMIDE 62.5 MCG/INH IN AEPB: 1.0000 | INHALATION_SPRAY | Freq: Two times a day (BID) | RESPIRATORY_TRACT | Status: DC

## 2018-10-06 MED ORDER — HEPARIN SODIUM (PORCINE) 5000 UNIT/ML IJ SOLN
5000.0000 [IU] | Freq: Three times a day (TID) | INTRAMUSCULAR | Status: DC
  Administered 2018-10-06 – 2018-10-10 (×11): 5000 [IU] via SUBCUTANEOUS
  Filled 2018-10-06 (×11): qty 1

## 2018-10-06 MED ORDER — CITALOPRAM HYDROBROMIDE 20 MG PO TABS
30.0000 mg | ORAL_TABLET | Freq: Every day | ORAL | Status: DC
  Administered 2018-10-06 – 2018-10-10 (×5): 30 mg via ORAL
  Filled 2018-10-06 (×5): qty 1

## 2018-10-06 MED ORDER — UMECLIDINIUM BROMIDE 62.5 MCG/INH IN AEPB
1.0000 | INHALATION_SPRAY | Freq: Every day | RESPIRATORY_TRACT | Status: DC
  Administered 2018-10-07 – 2018-10-10 (×4): 1 via RESPIRATORY_TRACT
  Filled 2018-10-06: qty 7

## 2018-10-06 MED ORDER — PRAMIPEXOLE DIHYDROCHLORIDE 0.25 MG PO TABS
0.2500 mg | ORAL_TABLET | Freq: Every day | ORAL | Status: DC
  Administered 2018-10-06 – 2018-10-09 (×4): 0.25 mg via ORAL
  Filled 2018-10-06 (×4): qty 1

## 2018-10-06 MED ORDER — SODIUM CHLORIDE 0.9 % IV SOLN: 250.0000 mL | INTRAVENOUS | Status: DC | PRN

## 2018-10-06 MED ORDER — SPIRONOLACTONE 25 MG PO TABS
25.0000 mg | ORAL_TABLET | Freq: Every day | ORAL | Status: DC
  Administered 2018-10-07 – 2018-10-10 (×4): 25 mg via ORAL
  Filled 2018-10-06 (×4): qty 1

## 2018-10-06 MED ORDER — TRAZODONE HCL 50 MG PO TABS
150.0000 mg | ORAL_TABLET | Freq: Every day | ORAL | Status: DC
  Administered 2018-10-06 – 2018-10-09 (×4): 150 mg via ORAL
  Filled 2018-10-06 (×4): qty 1

## 2018-10-06 MED ORDER — ASPIRIN EC 81 MG PO TBEC
81.0000 mg | DELAYED_RELEASE_TABLET | Freq: Every day | ORAL | Status: DC
  Administered 2018-10-07 – 2018-10-10 (×4): 81 mg via ORAL
  Filled 2018-10-06 (×4): qty 1

## 2018-10-06 MED ORDER — ATORVASTATIN CALCIUM 20 MG PO TABS
20.0000 mg | ORAL_TABLET | Freq: Every day | ORAL | Status: DC
  Administered 2018-10-06 – 2018-10-10 (×5): 20 mg via ORAL
  Filled 2018-10-06 (×5): qty 1

## 2018-10-06 MED ORDER — POTASSIUM CHLORIDE CRYS ER 20 MEQ PO TBCR
60.0000 meq | EXTENDED_RELEASE_TABLET | Freq: Two times a day (BID) | ORAL | Status: DC
  Administered 2018-10-06 – 2018-10-08 (×4): 60 meq via ORAL
  Filled 2018-10-06 (×4): qty 3

## 2018-10-06 MED ORDER — ACETAMINOPHEN 325 MG PO TABS: 650.0000 mg | ORAL_TABLET | ORAL | Status: DC | PRN

## 2018-10-06 MED ORDER — IPRATROPIUM-ALBUTEROL 0.5-2.5 (3) MG/3ML IN SOLN: 3.0000 mL | Freq: Four times a day (QID) | RESPIRATORY_TRACT | Status: DC | PRN

## 2018-10-06 MED ORDER — SODIUM CHLORIDE 0.9% FLUSH: 3.0000 mL | INTRAVENOUS | Status: DC | PRN

## 2018-10-06 MED ORDER — ONDANSETRON HCL 4 MG/2ML IJ SOLN: 4.0000 mg | Freq: Four times a day (QID) | INTRAMUSCULAR | Status: DC | PRN

## 2018-10-06 MED ORDER — MAGNESIUM OXIDE 400 (241.3 MG) MG PO TABS
400.0000 mg | ORAL_TABLET | Freq: Two times a day (BID) | ORAL | Status: DC
  Administered 2018-10-06 – 2018-10-10 (×8): 400 mg via ORAL
  Filled 2018-10-06 (×8): qty 1

## 2018-10-06 MED ORDER — PILOCARPINE HCL 1 % OP SOLN
1.0000 [drp] | Freq: Two times a day (BID) | OPHTHALMIC | Status: DC
  Administered 2018-10-06 – 2018-10-10 (×8): 1 [drp] via OPHTHALMIC
  Filled 2018-10-06 (×2): qty 15

## 2018-10-06 MED ORDER — SODIUM CHLORIDE 0.9% FLUSH
3.0000 mL | Freq: Two times a day (BID) | INTRAVENOUS | Status: DC
  Administered 2018-10-06 – 2018-10-09 (×7): 3 mL via INTRAVENOUS

## 2018-10-06 MED ORDER — FUROSEMIDE 10 MG/ML IJ SOLN
20.0000 mg | Freq: Once | INTRAMUSCULAR | Status: AC
  Administered 2018-10-06: 20 mg via INTRAVENOUS
  Filled 2018-10-06: qty 2

## 2018-10-06 MED ORDER — ENSURE ENLIVE PO LIQD
237.0000 mL | Freq: Three times a day (TID) | ORAL | Status: DC
  Administered 2018-10-07 – 2018-10-09 (×7): 237 mL via ORAL

## 2018-10-06 MED ORDER — FERROUS SULFATE 325 (65 FE) MG PO TABS
325.0000 mg | ORAL_TABLET | Freq: Every day | ORAL | Status: DC
  Administered 2018-10-07 – 2018-10-10 (×4): 325 mg via ORAL
  Filled 2018-10-06 (×4): qty 1

## 2018-10-06 NOTE — H&P (Addendum)
Cardiology Admission History and Physical:   Patient ID: [name] MRN: [id number]; DOB: 1941-04-28   Admission date: 10/06/2018  Primary Care Provider: [name], MD Primary Cardiologist: Dorris Carnes, MD  Primary Electrophysiologist:  None   Chief Complaint:  LE edema and hypokalemia in the setting of diuresis  Patient Profile:   [name] is a 77 y.o. female with history of CAD, carotid artery disease, chronic diastolic heart failure, recurrent DVTs, PE status post IVC filter, upper GI bleed, HTN, HLD, and PVD (follows with vascular).   History of Present Illness:   Ms. Trostle has a Hx of CAD s/p CABG 2004 and DES to L [place]. Last LHC 11/12 showed in-stent restenosis to the LM. LCX was occluded in prox segment. SVG to OM was patent as well as LAD. RCA had significant disease but appeared nondominant . The patient was admitted to [facility] in July 2013 with CP. Lexiscan Myoview showed localized ischemia in the inferior wall (base, mid). Ranexa was added. Pt with history of UGI bleed >> EGD with gastric erosions, AVMs in SB. Colonoscopy with pandiverticulits. Anticoag d/c'd.   In March 2019, she was admitted with pulmonary emboli and acute DVT in L femoral vein along with ischemic colitis. Found to have a SMA stenosis and underwent stenting by vascular surgery. Hospitalized with acute anemia due to abdominal hematoma on heparin. IVC filter placed. Also underwent paracentesis for ascites. CT with cirrhosis.   On 09/28/18, potassium was 2.7. Home potassium regimen was 20 meq BID. Pt reported some increased LE edema since stopping her lasix 2/2 hypokalemia. Discussed with Dr. Harrington Challenger. Decision made to increase potassium to 40 mEq BID for 3 days, then resume 20 mEq BID. Started spironolactone 25mg  daily.  She presented to clinic 10/05/18 for follow up.  She continued to take potassium 40mg  BID since 10/8 along with spironolactone 25mg  qd. No improvement in her  symptoms. She continued to have DOE and LE edema. No change in symptoms since PE earlier this year. No exertional chest pain or pressure. Denies orthopnea or PND.   She continued to report fatigue, DOE, and lower extremity edema. Maintained on spiro 25 mg daily and 40 mEq potassium BID. Pt seen in conjunction with Dr. Harrington Challenger in clinic with plans to repeat labs. Today, Dr. Harrington Challenger discussed labs with the patient by phone. She continued to be volume overloaded with SOB. Given her labs and attempts to manage diuresis and hypokalemia outpatient, decision was made to admit her to cardiology for further management.  On my interview, she is on room air at rest, but states she gets short of breath with minimal exertion. She denies chest pain and palpitations. She also complains of pain with urination.    Past Medical History:  Diagnosis Date  . Anemia    bld. transfusion post lumbar surgery- 2012  . Anxiety   . Arthralgia    NOS  . Blood transfusion 2011   WITH BACK SURGERY  . CAD (coronary artery disease)    s/p CABG 2004; s/p DES to LM in 2010;  [place] 10/29/11: EF 50-55%, mild elevated filling pressures, no pulmonary HTN, LM 90% ISR, LAD and CFX occluded, S-RI occluded (old), S-OM3 ok and L-LAD ok, native nondominant RCA 95% -  med rx recommended ; Lexiscan Myoview 7/13 at [facility]: demonstrated "normal LV function, anterior attenuation and localized ischemia, inferior, basilar, mid section"  . Carotid artery disease ([place])    Carotid US 5/18:  RICA 8-41; LICA  40-59; R subclavian stenosis - Repeat 1 year.  . CHF (congestive heart failure) ([place])   . Chronic diastolic heart failure (HCC)    Echo 9/10: EF 16-10%, grade 1 diastolic dysfunction  . COPD (chronic obstructive pulmonary disease) ([place])    Emphysema dxed by Dr. Woody Seller in [place] based on PFTs per pt in 2006; placed on albuterol  . Depression    TAKES CELEXA  AND  (OFF- WELBUTRIN)  . DVT of lower extremity (deep venous thrombosis) (HCC)     recurrent. bilateral (2 episodes)  . Dyspnea   . Dysrhythmia    afib with cabg  . Exertional angina (HCC)    Treated with Isosorbide, Ranexa, amlodipine; intolerant to metoprolol  . GERD (gastroesophageal reflux disease)   . HLD (hyperlipidemia)   . Hypertension   . Hyperthyroidism   . Obesity (BMI 30-39.9) 2009   BMI 33  . Osteoarthritis   . Oxygen deficiency   . Oxygen dependent    oses 4L at home as needed   . Pneumonia   . PVD (peripheral vascular disease) ([place])   . Sleep apnea 2012   USED CPAP THEN  STARTED USING SPIRIVA , Nov. 2013- last evaluation , changed from mask to aparatus that is just for her nose.. ; reports 05-28-18 "the mask smothers me so i dont use it right now"    Past Surgical History:  Procedure Laterality Date  . ANKLE SURGERY  2004  . [place]   lower; another scheduled, opt. reports 4 back- lumbar, 3 cerv. fusions  for later 2009  . BREAST ENHANCEMENT SURGERY    . CARDIAC CATHETERIZATION  11/2009   Patent LIMA to LAD and patent SVG to OM1. Occluded SVG to ramus and diagonal. Left main: 90% ostial stenosis, LCX 60-70% proximal stenosis  . CHOLECYSTECTOMY    . COLONOSCOPY WITH PROPOFOL N/A 02/17/2017   Procedure: COLONOSCOPY WITH PROPOFOL;  Surgeon: Jerene Bears, MD;  Location: [facility] ENDOSCOPY;  Service: Endoscopy;  Laterality: N/A;  . CORONARY ANGIOPLASTY WITH STENT PLACEMENT  11/2009   Drug eluting stent to left main artery: 4.0 X 12 mm Ion   . CORONARY ARTERY BYPASS GRAFT  2004  . ESOPHAGOGASTRODUODENOSCOPY N/A 02/14/2017   Procedure: ESOPHAGOGASTRODUODENOSCOPY (EGD);  Surgeon: Doran Stabler, MD;  Location: [facility] ENDOSCOPY;  Service: Endoscopy;  Laterality: N/A;  . EYE SURGERY  2006   BILATERAL CAT EXT.   Marland Kitchen GIVENS CAPSULE STUDY N/A 02/15/2017   Procedure: GIVENS CAPSULE STUDY;  Surgeon: Doran Stabler, MD;  Location: [place];  Service: Endoscopy;  Laterality: N/A;  . GROIN MASS OPEN BIOPSY  2004  . IR IVC FILTER PLMT / S&I /IMG GUID/MOD  SED  03/14/2018  . IR PARACENTESIS  03/10/2018  . JOINT REPLACEMENT  2012    BILATERAL KNEES  . JOINT REPLACEMENT  2010    RIGHT HIP REPLACEMENT  . NECK SURGERY  12/08   Due for repeat neck surgery  . PERIPHERAL VASCULAR INTERVENTION Left 03/05/2018   Procedure: PERIPHERAL VASCULAR INTERVENTION;  Surgeon: Elam Dutch, MD;  Location: [place] CV LAB;  Service: Cardiovascular;  Laterality: Left;  Attempted unsuccess\ful Per Dr. Eden Lathe  . PERIPHERAL VASCULAR INTERVENTION  03/08/2018   Procedure: PERIPHERAL VASCULAR INTERVENTION;  Surgeon: Waynetta Sandy, MD;  Location: [place] CV LAB;  Service: Cardiovascular;;  SMA Stent   . REVERSE SHOULDER ARTHROPLASTY  02/26/2012   Procedure: REVERSE SHOULDER ARTHROPLASTY;  Surgeon: [place] Shutter, MD;  Location: [facility]  OR;  Service: Orthopedics;  Laterality: Right;  RIGHT SHOULDER REVERSED ARTHROPLASTY  . SHOULDER ARTHROSCOPY WITH SUBACROMIAL DECOMPRESSION Left 02/10/2013   Procedure: SHOULDER ARTHROSCOPY WITH SUBACROMIAL DECOMPRESSION DISTAL CLAVICLE RESECTION;  Surgeon: [place] Shutter, MD;  Location: [place];  Service: Orthopedics;  Laterality: Left;  DISTAL CLAVICLE RESECTION  . TOTAL HIP ARTHROPLASTY  2007   Right  . TRANSURETHRAL RESECTION OF BLADDER TUMOR N/A 05/31/2018   Procedure: TRANSURETHRAL RESECTION OF BLADDER TUMOR (TURBT);  Surgeon: Lucas Mallow, MD;  Location: [facility] ORS;  Service: Urology;  Laterality: N/A;  . VISCERAL ANGIOGRAPHY N/A 03/05/2018   Procedure: VISCERAL ANGIOGRAPHY;  Surgeon: Elam Dutch, MD;  Location: [place] [facility] LAB;  Service: Cardiovascular;  Laterality: N/A;  . VISCERAL ANGIOGRAPHY N/A 03/08/2018   Procedure: VISCERAL ANGIOGRAPHY;  Surgeon: Waynetta Sandy, MD;  Location: [place] [facility] LAB;  Service: Cardiovascular;  Laterality: N/A;     Medications Prior to Admission: Prior to Admission medications   Medication Sig Start Date End Date Taking? Authorizing Provider  allopurinol (ZYLOPRIM)  100 MG tablet Take 100 mg by mouth daily. 01/30/17   [provider]  aspirin EC 81 MG tablet Take 81 mg by mouth daily.    [provider]  atorvastatin (LIPITOR) 20 MG tablet Take 1 tablet (20 mg total) by mouth daily. 05/19/18 10/05/18  [name] T, PA-C  citalopram (CELEXA) 20 MG tablet Take 30 mg by mouth daily.     [provider]  CVS D3 1000 units capsule Take 1,000 Units by mouth daily. 01/14/18   [provider]  cyclobenzaprine (FLEXERIL) 10 MG tablet Take 10 mg by mouth 3 (three) times daily as needed for muscle spasms.     [provider]  docusate sodium (COLACE) 100 MG capsule Take 100 mg by mouth daily.    [provider]  feeding supplement, ENSURE ENLIVE, (ENSURE ENLIVE) LIQD Take 237 mLs by mouth 3 (three) times daily between meals. 03/26/18   Mikhail, Velta Addison, DO  ferrous sulfate 325 (65 FE) MG tablet Take 325 mg by mouth daily with breakfast.    [provider]  HYDROcodone-acetaminophen (NORCO) 5-325 MG tablet Take 1 tablet by mouth every 4 (four) hours as needed for moderate pain. 05/31/18   Lucas Mallow, MD  INCRUSE ELLIPTA 62.5 MCG/INH AEPB Inhale 1 puff into the lungs 2 (two) times daily.  11/30/16   [provider]  Ipratropium-Albuterol (COMBIVENT) 20-100 MCG/ACT AERS respimat Inhale 1 puff into the lungs every 6 (six) hours as needed for wheezing.     [provider]  ipratropium-albuterol (DUONEB) 0.5-2.5 (3) MG/3ML SOLN Take 3 mLs by nebulization 4 (four) times daily as needed (for shortness of breath).     [provider]  magnesium oxide (MAG-OX) 400 (241.3 Mg) MG tablet Take 1 tablet (400 mg total) by mouth 2 (two) times daily. 03/26/18   Mikhail, Velta Addison, DO  nitroGLYCERIN (NITROSTAT) 0.4 MG SL tablet Place 1 tablet (0.4 mg total) under the tongue every 5 (five) minutes as needed for chest pain. For chest pain 04/24/16   Fay Records, MD  omeprazole (PRILOSEC) 40 MG capsule Take  40 mg by mouth 2 (two) times daily.     [provider]  Oxycodone HCl 10 MG TABS Take 1 tablet (10 mg total) by mouth every 6 (six) hours as needed. Patient taking differently: Take 10 mg by mouth every 6 (six) hours as needed (for pain).  04/05/18  Nita Sells, MD  pilocarpine (PILOCAR) 1 % ophthalmic solution Place 1 drop into both eyes 2 (two) times daily.    [provider]  potassium chloride (K-DUR,KLOR-CON) 10 MEQ tablet Take 40 mEq by mouth 2 (two) times daily.    [provider]  pramipexole (MIRAPEX) 0.25 MG tablet Take 0.25 mg by mouth at bedtime. 01/14/18   [provider]  roflumilast (DALIRESP) 500 MCG TABS tablet Take 500 mcg by mouth daily.     [provider]  spironolactone (ALDACTONE) 25 MG tablet Take 1 tablet (25 mg total) by mouth daily. 09/29/18 09/29/19  Kroeger, Lorelee Cover., PA-C  traZODone (DESYREL) 50 MG tablet Take 150 mg by mouth at bedtime.     [provider]     Allergies:    Allergies  Allergen Reactions  . Zofran [Ondansetron Hcl] Nausea Only  . Zolpidem Tartrate Anxiety and Other (See Comments)    CONFUSION   . Ativan [Lorazepam] Other (See Comments)    Confusion   . Pitavastatin Other (See Comments)    Not sure  . Ropinirole Nausea Only and Other (See Comments)    Makes legs jump  . Penicillins Rash and Other (See Comments)    Tolerated cephalosporins in past Has patient had a PCN reaction causing immediate rash, facial/tongue/throat swelling, SOB or lightheadedness with hypotension: Yes Has patient had a PCN reaction causing severe rash involving mucus membranes or skin necrosis: No Has patient had a PCN reaction that required hospitalization: No Has patient had a PCN reaction occurring within the last 10 years: No If all of the above answers are "NO", then may proceed with Cephalosporin use.     Social History:   Social History   Socioeconomic History  . Marital status: Widowed     Spouse name: Not on file  . Number of children: 3  . Years of education: Not on file  . Highest education level: Not on file  Occupational History  . Occupation: retired    Comment: Electronics engineer: RETIRED  Social Needs  . Financial resource strain: Not on file  . Food insecurity:    Worry: Not on file    Inability: Not on file  . Transportation needs:    Medical: Not on file    Non-medical: Not on file  Tobacco Use  . Smoking status: Former Smoker    Years: 45.00    Last attempt to quit: 12/22/2000    Years since quitting: 17.8  . Smokeless tobacco: Never Used  Substance and Sexual Activity  . Alcohol use: No    Alcohol/week: 0.0 standard drinks  . Drug use: No  . Sexual activity: Never  Lifestyle  . Physical activity:    Days per week: Not on file    Minutes per session: Not on file  . Stress: Not on file  Relationships  . Social connections:    Talks on phone: Not on file    Gets together: Not on file    Attends religious service: Not on file    Active member of club or organization: Not on file    Attends meetings of clubs or organizations: Not on file    Relationship status: Not on file  . Intimate partner violence:    Fear of current or ex partner: Not on file    Emotionally abused: Not on file    Physically abused: Not on file    Forced sexual activity: Not on file  Other  Topics Concern  . Not on file  Social History Narrative      3 grown children.    Family History:   The patient's family history includes COPD in her father; Cancer in her mother, sister, and unknown relative; Colitis in her unknown relative; Heart disease in her father, sister, and unknown relative; Lung cancer in her father; Ovarian cancer in her mother.    ROS:  Please see the history of present illness.  All other ROS reviewed and negative.     Physical Exam/Data:  There were no vitals filed for this visit. No intake or output data in the 24 hours ending 10/06/18  1627 There were no vitals filed for this visit. There is no height or weight on file to calculate BMI.  General:  Well nourished, well developed, in no acute distress HEENT: normal Neck: + JVD Vascular: No carotid bruits Cardiac:  normal S1, S2; RRR; no murmur  Lungs:  Respirations unlabored, diminished in bases Abd: soft, nontender, no hepatomegaly  Ext: 1+ B LE edema Musculoskeletal:  No deformities, BUE and BLE strength normal and equal Skin: warm and dry  Neuro:  CNs 2-12 intact, no focal abnormalities noted Psych:  Normal affect    EKG:  The ECG is pending  Relevant CV Studies:  Myoview 08/2017: low risk  Echo 03/2018: Study Conclusions - Left ventricle: The cavity size was normal. Wall thickness was   normal. Systolic function was normal. The estimated ejection   fraction was in the range of 55% to 60%. Wall motion was normal;   there were no regional wall motion abnormalities. The study is   not technically sufficient to allow evaluation of LV diastolic   function. - Mitral valve: Calcified annulus.   Laboratory Data:  Chemistry Recent Labs  Lab 10/05/18 0910  NA 141  K 3.3*  CL 100  CO2 33*  GLUCOSE 90  BUN 14  CREATININE 1.00  CALCIUM 9.3  GFRNONAA 54*  GFRAA 62    Recent Labs  Lab 10/05/18 0910  PROT 6.3  ALBUMIN 3.8  AST 9  ALT <5  ALKPHOS 87  BILITOT 0.2   Hematology Recent Labs  Lab 10/05/18 0910  WBC 8.1  RBC 3.94  HGB 10.9*  HCT 33.8*  MCV 86  MCH 27.7  MCHC 32.2  RDW 17.5*  PLT 267   Cardiac EnzymesNo results for input(s): TROPONINI in the last 168 hours. No results for input(s): TROPIPOC in the last 168 hours.  BNP Recent Labs  Lab 10/05/18 0910  PROBNP 1,048*    DDimer No results for input(s): DDIMER in the last 168 hours.  Radiology/Studies:  No results found.  Assessment and Plan:   1. Acute on chronic diastolic heart failure 2. Hypokalemia 3. DOE, lower extremity swelling - home regimen: 25 mg  spironolactone and 40 mEq potassium BID - pro-BNP elevated yesterday - K 3.3 (2.7); sCr 1.00 - She has not been taking lasix, as instructed - plan to replace with 60 mEq k-dur BID - 20 mg IV lasix OTO - BMP in the AM - if K stable, plan to start 40 mg IV lasix BID tomorrow   4. Hx of PE, DVTs 5. Hx of GI bleed - no anticoagulation - IVC filter in place   6. Pain with urination - will send a UA and culture    7. Mesenteric ischemia - followed by vascular   Severity of Illness: The appropriate patient status for this patient is INPATIENT. Inpatient  status is judged to be reasonable and necessary in order to provide the required intensity of service to ensure the patient's safety. The patient's presenting symptoms, physical exam findings, and initial radiographic and laboratory data in the context of their chronic comorbidities is felt to place them at high risk for further clinical deterioration. Furthermore, it is not anticipated that the patient will be medically stable for discharge from the hospital within 2 midnights of admission. The following factors support the patient status of inpatient.   " The patient's presenting symptoms include CHF. " The worrisome physical exam findings include JVP. " The initial radiographic and laboratory data are worrisome because of hypokalemia " The chronic co-morbidities include    * I certify that at the point of admission it is my clinical judgment that the patient will require inpatient hospital care spanning beyond 2 midnights from the point of admission due to high intensity of service, high risk for further deterioration and high frequency of surveillance required.*    For questions or updates, please contact [place] Please consult [email or web address] for contact info under        Signed, Ledora Bottcher, PA  10/06/2018 4:27 PM   Agree with note by Fabian Sharp PA-C  Ms. Camposano is being admitted for diastolic heart failure,  volume overload and hypokalemia.  She is a patient of Dr. Nevin Bloodgood Ross's.  She does have a history of ischemic heart disease status post remote bypass surgery and subsequent left main stenting.  She has normal LV function and diastolic dysfunction.  She also has history of pulmonary embolus and DVT and has had IVC filter in place because of GI bleeding.  Her Lasix has been held for several days because of hypokalemia and she had subsequent volume overload.  She does admit to dietary indiscretion with regards to salt.  She is somewhat short of breath.  Her lungs are clear.  Her jugular venous pressure is mildly elevated she has ankle edema on exam.  EKG shows no acute changes.  We will treat her with IV diuretics and replete her potassium, follow electrolytes and renal function as well as input and output.  Lorretta Harp, M.D., [place], [facility], [place] [address] [place], [place]  [address]  [phone] 10/06/2018 5:58 PM

## 2018-10-06 NOTE — Telephone Encounter (Signed)
New Message         Patient's daughter is calling today to see if her mother has to go to the hospital. Per patient she got a phone call stating that she is needing to be admitted to the hospital.

## 2018-10-06 NOTE — Telephone Encounter (Signed)
Spoke to pt re lab resutls     Potassium is better but still low  She is SOB   On exam yesterday was volume overloaded   Sats 90%w with walking   Given meds , K wuld recomm admit for expeditious and safe Rx    Pt is agreeable WOuld consider consult to renal service for assist  I have spoken to Bed control   They will call her when a tele bed is clean

## 2018-10-06 NOTE — Telephone Encounter (Signed)
Returned call to patient's daughter. Patient is on her way to [facility] now, she was informed a bed is available.

## 2018-10-07 ENCOUNTER — Ambulatory Visit: Payer: Medicare HMO | Admitting: Vascular Surgery

## 2018-10-07 ENCOUNTER — Encounter (HOSPITAL_COMMUNITY): Payer: Medicare HMO

## 2018-10-07 ENCOUNTER — Inpatient Hospital Stay (HOSPITAL_COMMUNITY): Payer: Medicare HMO

## 2018-10-07 DIAGNOSIS — E876 Hypokalemia: Secondary | ICD-10-CM

## 2018-10-07 LAB — BASIC METABOLIC PANEL
Anion gap: 8 (ref 5–15)
Anion gap: 9 (ref 5–15)
BUN: 15 mg/dL (ref 8–23)
BUN: 16 mg/dL (ref 8–23)
CO2: 32 mmol/L (ref 22–32)
CO2: 31 mmol/L (ref 22–32)
Calcium: 8.8 mg/dL — ABNORMAL LOW (ref 8.9–10.3)
Calcium: 9.1 mg/dL (ref 8.9–10.3)
Chloride: 100 mmol/L (ref 98–111)
Chloride: 102 mmol/L (ref 98–111)
Creatinine, Ser: 1.14 mg/dL — ABNORMAL HIGH (ref 0.44–1.00)
Creatinine, Ser: 1.16 mg/dL — ABNORMAL HIGH (ref 0.44–1.00)
GFR calc Af Amer: 52 mL/min — ABNORMAL LOW (ref >60)
GFR calc Af Amer: 51 mL/min — ABNORMAL LOW (ref >60)
GFR calc non Af Amer: 45 mL/min — ABNORMAL LOW (ref >60)
GFR calc non Af Amer: 44 mL/min — ABNORMAL LOW (ref >60)
Glucose, Bld: 89 mg/dL (ref 70–99)
Glucose, Bld: 159 mg/dL — ABNORMAL HIGH (ref 70–99)
Potassium: 3.3 mmol/L — ABNORMAL LOW (ref 3.5–5.1)
Potassium: 3.8 mmol/L (ref 3.5–5.1)
Sodium: 140 mmol/L (ref 135–145)
Sodium: 142 mmol/L (ref 135–145)

## 2018-10-07 LAB — URINALYSIS, ROUTINE W REFLEX MICROSCOPIC
Bacteria, UA: NONE SEEN
Bilirubin Urine: NEGATIVE
Glucose, UA: NEGATIVE mg/dL
Ketones, ur: NEGATIVE mg/dL
Leukocytes, UA: NEGATIVE
Nitrite: NEGATIVE
Protein, ur: NEGATIVE mg/dL
Specific Gravity, Urine: 1.015 (ref 1.005–1.030)
pH: 7 (ref 5.0–8.0)

## 2018-10-07 MED ORDER — FUROSEMIDE 10 MG/ML IJ SOLN
40.0000 mg | Freq: Every day | INTRAMUSCULAR | Status: AC
  Administered 2018-10-08 – 2018-10-09 (×2): 40 mg via INTRAVENOUS
  Filled 2018-10-07 (×2): qty 4

## 2018-10-07 NOTE — Progress Notes (Signed)
Cardiology Admission History and Physical:   Patient ID: [name] MRN: [id number]; DOB: 05/19/1941   Admission date: 10/06/2018  Primary Care Provider: [name], MD Primary Cardiologist: Dorris Carnes, MD  Primary Electrophysiologist:  None   Chief Complaint:  LE edema and hypokalemia in the setting of diuresis  Patient Profile:   [name] is a 77 y.o. female with history of CAD, carotid artery disease, chronic diastolic heart failure, recurrent DVTs, PE status post IVC filter, upper GI bleed, HTN, HLD, and PVD (follows with vascular).   Subjective:   [name] is doing well today, breathing comfortably, but says "I still feel sick"  Past Medical History:  Diagnosis Date  . Anemia    bld. transfusion post lumbar surgery- 2012  . Anxiety   . Arthralgia    NOS  . Blood transfusion 2012; 02/2018   WITH BACK SURGERY; "related to hematoma in stomach" (10/06/2018)  . CAD (coronary artery disease)    s/p CABG 2004; s/p DES to LM in 2010;  [place] 10/29/11: EF 50-55%, mild elevated filling pressures, no pulmonary HTN, LM 90% ISR, LAD and CFX occluded, S-RI occluded (old), S-OM3 ok and L-LAD ok, native nondominant RCA 95% -  med rx recommended ; Lexiscan Myoview 7/13 at [facility]: demonstrated "normal LV function, anterior attenuation and localized ischemia, inferior, basilar, mid section"  . Carotid artery disease ([place])    Carotid US 4/09:  RICA 8-11; LICA 91-47; R subclavian stenosis - Repeat 1 year.  . CHF (congestive heart failure) ([place])   . Chronic diastolic heart failure (HCC)    Echo 9/10: EF 82-95%, grade 1 diastolic dysfunction  . Chronic lower back pain   . COPD (chronic obstructive pulmonary disease) ([place])    Emphysema dxed by Dr. Woody Seller in [place] based on PFTs per pt in 2006; placed on albuterol  . Depression    TAKES CELEXA  AND  (OFF- WELBUTRIN)  . DVT of lower extremity (deep venous thrombosis) (HCC)    recurrent. bilateral (2 episodes)  . Dyspnea   .  Dysrhythmia    afib with cabg  . Exertional angina (HCC)    Treated with Isosorbide, Ranexa, amlodipine; intolerant to metoprolol  . GERD (gastroesophageal reflux disease)   . Gout    "on daily RX" (10/06/2018)  . HLD (hyperlipidemia)   . Hypertension   . Hyperthyroidism   . Obesity (BMI 30-39.9) 2009   BMI 33  . Osteoarthritis    "all over; hands" (10/06/2018)  . Oxygen deficiency   . Oxygen dependent    4L at home as needed (10/06/2018)  . Pneumonia   . PVD (peripheral vascular disease) ([place])   . Sleep apnea 2012   USED CPAP THEN  STARTED USING SPIRIVA , Nov. 2013- last evaluation , changed from mask to aparatus that is just for her nose.. ; reports 05-28-18 "the mask smothers me so i dont use it right now" (10/06/2018)    Past Surgical History:  Procedure Laterality Date  . ANKLE SURGERY  2004  . ANTERIOR CERVICAL DECOMP/DISCECTOMY FUSION  11/2007  . AUGMENTATION MAMMAPLASTY    . [place]   lower; another scheduled, opt. reports 4 back- lumbar, 3 cerv. fusions  for later 2009  . CARDIAC CATHETERIZATION  11/2009   Patent LIMA to LAD and patent SVG to OM1. Occluded SVG to ramus and diagonal. Left main: 90% ostial stenosis, LCX 60-70% proximal stenosis  . CATARACT EXTRACTION W/ INTRAOCULAR LENS  IMPLANT, BILATERAL  Bilateral 2006  . COLONOSCOPY WITH PROPOFOL N/A 02/17/2017   Procedure: COLONOSCOPY WITH PROPOFOL;  Surgeon: Jerene Bears, MD;  [facility] END[facility];  Service: Endoscopy;  Laterality: N/A;  . CORONARY ANGIOPLASTY WITH STENT PLACEMENT  11/2009   Drug eluting stent to left main artery: 4.0 X 12 mm Ion   . CORONARY ARTERY BYPASS GRAFT  2004   "CABG X4"  . ESOPHAGOGASTRODUODENOSCOPY N/A 02/14/2017   Procedure: ESOPHAGOGASTRODUODENOSCOPY (EGD);  Surgeon: Doran Stabler, MD;  Location: [facility] ENDOSCOPY;  Service: Endoscopy;  Laterality: N/A;  . GIVENS CAPSULE STUDY N/A 02/15/2017   Procedure: GIVENS CAPSULE STUDY;  Surgeon: Doran Stabler, MD;  Location: [place];  Service: Endoscopy;  Laterality: N/A;  . GROIN MASS OPEN BIOPSY  2004  . IR IVC FILTER PLMT / S&I /IMG GUID/MOD SED  03/14/2018  . IR PARACENTESIS  03/10/2018  . JOINT REPLACEMENT    . LAPAROSCOPIC CHOLECYSTECTOMY    . PERIPHERAL VASCULAR INTERVENTION Left 03/05/2018   Procedure: PERIPHERAL VASCULAR INTERVENTION;  Surgeon: Elam Dutch, MD;  Location: [place] CV LAB;  Service: Cardiovascular;  Laterality: Left;  Attempted unsuccess\ful Per Dr. Eden Lathe  . PERIPHERAL VASCULAR INTERVENTION  03/08/2018   Procedure: PERIPHERAL VASCULAR INTERVENTION;  Surgeon: Waynetta Sandy, MD;  Location: [place] CV LAB;  Service: Cardiovascular;;  SMA Stent   . REPLACEMENT TOTAL KNEE BILATERAL Bilateral 2012  . REVERSE SHOULDER ARTHROPLASTY  02/26/2012   Procedure: REVERSE SHOULDER ARTHROPLASTY;  Surgeon: [place] Shutter, MD;  Location: [place];  Service: Orthopedics;  Laterality: Right;  RIGHT SHOULDER REVERSED ARTHROPLASTY  . SHOULDER ARTHROSCOPY WITH SUBACROMIAL DECOMPRESSION Left 02/10/2013   Procedure: SHOULDER ARTHROSCOPY WITH SUBACROMIAL DECOMPRESSION DISTAL CLAVICLE RESECTION;  Surgeon: [place] Shutter, MD;  Location: [place];  Service: Orthopedics;  Laterality: Left;  DISTAL CLAVICLE RESECTION  . TONSILLECTOMY    . TOTAL HIP ARTHROPLASTY  2007   Right  . TRANSURETHRAL RESECTION OF BLADDER TUMOR N/A 05/31/2018   Procedure: TRANSURETHRAL RESECTION OF BLADDER TUMOR (TURBT);  Surgeon: [place] Mallow, MD;  Location: [facility];  Service: Urology;  Laterality: N/A;  . VISCERAL ANGIOGRAPHY N/A 03/05/2018   Procedure: VISCERAL ANGIOGRAPHY;  Surgeon: Elam Dutch, MD;  Location: [place] CV LAB;  Service: Cardiovascular;  Laterality: N/A;  . VISCERAL ANGIOGRAPHY N/A 03/08/2018   Procedure: VISCERAL ANGIOGRAPHY;  Surgeon: Waynetta Sandy, MD;  Location: [place] CV LAB;  Service: Cardiovascular;  Laterality: N/A;     Medications Prior to Admission: Prior to Admission  medications   Medication Sig Start Date End Date Taking? Authorizing Provider  allopurinol (ZYLOPRIM) 100 MG tablet Take 100 mg by mouth daily. 01/30/17   [provider]  aspirin EC 81 MG tablet Take 81 mg by mouth daily.    [provider]  atorvastatin (LIPITOR) 20 MG tablet Take 1 tablet (20 mg total) by mouth daily. 05/19/18 10/05/18  [name] T, PA-C  citalopram (CELEXA) 20 MG tablet Take 30 mg by mouth daily.     [provider]  CVS D3 1000 units capsule Take 1,000 Units by mouth daily. 01/14/18   [provider]  cyclobenzaprine (FLEXERIL) 10 MG tablet Take 10 mg by mouth 3 (three) times daily as needed for muscle spasms.     [provider]  docusate sodium (COLACE) 100 MG capsule Take 100 mg by mouth daily.    [provider]  feeding supplement, ENSURE ENLIVE, (ENSURE ENLIVE) LIQD Take 237 mLs by  mouth 3 (three) times daily between meals. 03/26/18   Mikhail, Velta Addison, DO  ferrous sulfate 325 (65 FE) MG tablet Take 325 mg by mouth daily with breakfast.    [provider]  HYDROcodone-acetaminophen (NORCO) 5-325 MG tablet Take 1 tablet by mouth every 4 (four) hours as needed for moderate pain. 05/31/18   [place] Mallow, MD  INCRUSE ELLIPTA 62.5 MCG/INH AEPB Inhale 1 puff into the lungs 2 (two) times daily.  11/30/16   [provider]  Ipratropium-Albuterol (COMBIVENT) 20-100 MCG/ACT AERS respimat Inhale 1 puff into the lungs every 6 (six) hours as needed for wheezing.     [provider]  ipratropium-albuterol (DUONEB) 0.5-2.5 (3) MG/3ML SOLN Take 3 mLs by nebulization 4 (four) times daily as needed (for shortness of breath).     [provider]  magnesium oxide (MAG-OX) 400 (241.3 Mg) MG tablet Take 1 tablet (400 mg total) by mouth 2 (two) times daily. 03/26/18   Mikhail, Velta Addison, DO  nitroGLYCERIN (NITROSTAT) 0.4 MG SL tablet Place 1 tablet (0.4 mg total) under the tongue every 5 (five) minutes as  needed for chest pain. For chest pain 04/24/16   Fay Records, MD  omeprazole (PRILOSEC) 40 MG capsule Take 40 mg by mouth 2 (two) times daily.     [provider]  Oxycodone HCl 10 MG TABS Take 1 tablet (10 mg total) by mouth every 6 (six) hours as needed. Patient taking differently: Take 10 mg by mouth every 6 (six) hours as needed (for pain).  04/05/18   Nita Sells, MD  pilocarpine (PILOCAR) 1 % ophthalmic solution Place 1 drop into both eyes 2 (two) times daily.    [provider]  potassium chloride (K-DUR,KLOR-CON) 10 MEQ tablet Take 40 mEq by mouth 2 (two) times daily.    [provider]  pramipexole (MIRAPEX) 0.25 MG tablet Take 0.25 mg by mouth at bedtime. 01/14/18   [provider]  roflumilast (DALIRESP) 500 MCG TABS tablet Take 500 mcg by mouth daily.     [provider]  spironolactone (ALDACTONE) 25 MG tablet Take 1 tablet (25 mg total) by mouth daily. 09/29/18 09/29/19  Kroeger, Lorelee Cover., PA-C  traZODone (DESYREL) 50 MG tablet Take 150 mg by mouth at bedtime.     [provider]     Allergies:    Allergies  Allergen Reactions  . Zofran [Ondansetron Hcl] Nausea Only  . Zolpidem Tartrate Anxiety and Other (See Comments)    CONFUSION   . Ativan [Lorazepam] Other (See Comments)    Confusion   . Pitavastatin Other (See Comments)    Not sure  . Ropinirole Nausea Only and Other (See Comments)    Makes legs jump  . Penicillins Rash and Other (See Comments)    Tolerated cephalosporins in past Has patient had a PCN reaction causing immediate rash, facial/tongue/throat swelling, SOB or lightheadedness with hypotension: Yes Has patient had a PCN reaction causing severe rash involving mucus membranes or skin necrosis: No Has patient had a PCN reaction that required hospitalization: No Has patient had a PCN reaction occurring within the last 10 years: No If all of the above answers are "NO", then may proceed with Cephalosporin  use.     Social History:   Social History   Socioeconomic History  . Marital status: Widowed    Spouse name: Not on file  . Number of children: 3  . Years of education: Not on file  . Highest education level:  Not on file  Occupational History  . Occupation: retired    Comment: Electronics engineer: RETIRED  Social Needs  . Financial resource strain: Not on file  . Food insecurity:    Worry: Not on file    Inability: Not on file  . Transportation needs:    Medical: Not on file    Non-medical: Not on file  Tobacco Use  . Smoking status: Former Smoker    Packs/day: 1.00    Years: 45.00    Pack years: 45.00    Types: Cigarettes    Last attempt to quit: 12/22/2000    Years since quitting: 17.8  . Smokeless tobacco: Never Used  Substance and Sexual Activity  . Alcohol use: Never    Alcohol/week: 0.0 standard drinks    Frequency: Never  . Drug use: Never  . Sexual activity: Not Currently  Lifestyle  . Physical activity:    Days per week: Not on file    Minutes per session: Not on file  . Stress: Not on file  Relationships  . Social connections:    Talks on phone: Not on file    Gets together: Not on file    Attends religious service: Not on file    Active member of club or organization: Not on file    Attends meetings of clubs or organizations: Not on file    Relationship status: Not on file  . Intimate partner violence:    Fear of current or ex partner: Not on file    Emotionally abused: Not on file    Physically abused: Not on file    Forced sexual activity: Not on file  Other Topics Concern  . Not on file  Social History Narrative      3 grown children.    Family History:   The patient's family history includes COPD in her father; Cancer in her mother, sister, and unknown relative; Colitis in her unknown relative; Heart disease in her father, sister, and unknown relative; Lung cancer in her father; Ovarian cancer in her mother.    ROS:  Please see the  history of present illness.  All other ROS reviewed and negative.     Physical Exam/Data:   Vitals:   10/07/18 0633 10/07/18 0927 10/07/18 1358 10/07/18 2013  BP: (!) 125/58  (!) 131/58 (!) 124/54  Pulse: 86 86 74 75  Resp: 16 17  18   Temp: 98.3 F (36.8 C)  97.6 F (36.4 C) 98 F (36.7 C)  TempSrc: Oral  Oral Oral  SpO2: 93% 92% 100% 94%  Weight:      Height:        Intake/Output Summary (Last 24 hours) at 10/07/2018 2250 Last data filed at 10/07/2018 2102 Gross per 24 hour  Intake 483 ml  Output 200 ml  Net 283 ml   Filed Weights   10/06/18 1629 10/07/18 0629  Weight: 72.9 kg 70.4 kg   Body mass index is 23.6 kg/m.  General:  Well nourished, well developed, in no acute distress HEENT: normal Neck: + JVD Vascular: No carotid bruits Cardiac:  normal S1, S2; RRR; no murmur  Lungs:  Respirations unlabored, diminished in bases Abd: soft, nontender, no hepatomegaly  Ext: trace to 1+ B LE edema Musculoskeletal:  No deformities, BUE and BLE strength normal and equal Skin: warm and dry  Neuro:  Grossly nonfocal Psych:  Normal affect    EKG:  Not performed  Relevant CV Studies:  Myoview 08/2017:  low risk  Echo 03/2018: Study Conclusions - Left ventricle: The cavity size was normal. Wall thickness was   normal. Systolic function was normal. The estimated ejection   fraction was in the range of 55% to 60%. Wall motion was normal;   there were no regional wall motion abnormalities. The study is   not technically sufficient to allow evaluation of LV diastolic   function. - Mitral valve: Calcified annulus.   Laboratory Data:  Chemistry Recent Labs  Lab 10/07/18 0157 10/07/18 1251  NA 140 142  K 3.3* 3.8  CL 100 102  CO2 32 31  GLUCOSE 89 159*  BUN 15 16  CREATININE 1.14* 1.16*  CALCIUM 8.8* 9.1  GFRNONAA 45* 44*  GFRAA 52* 51*  ANIONGAP 8 9    Recent Labs  Lab 10/05/18 0910 10/06/18 2014  PROT 6.3 6.9  ALBUMIN 3.8 3.2*  AST 9 13*  ALT <5 8    ALKPHOS 87 83  BILITOT 0.2 0.3   Hematology Recent Labs  Lab 10/05/18 0910 10/06/18 2014  WBC 8.1 8.6  RBC 3.94 4.06  HGB 10.9* 11.4*  HCT 33.8* 36.1  MCV 86 88.9  MCH 27.7 28.1  MCHC 32.2 31.6  RDW 17.5* 16.7*  PLT 267 284   Cardiac EnzymesNo results for input(s): TROPONINI in the last 168 hours. No results for input(s): TROPIPOC in the last 168 hours.  BNP Recent Labs  Lab 10/05/18 0910 10/06/18 2014  BNP  --  124.2*  PROBNP 1,048*  --     DDimer No results for input(s): DDIMER in the last 168 hours.  Radiology/Studies:  Dg Chest 2 View  Result Date: 10/07/2018 CLINICAL DATA:  Dyspnea. EXAM: CHEST - 2 VIEW COMPARISON:  Radiographs of April 01, 2018. FINDINGS: Stable cardiomediastinal silhouette. Atherosclerosis of thoracic aorta is noted. Status post coronary artery bypass graft. Both lungs are clear. No pneumothorax or pleural effusion is noted. Status post right shoulder arthroplasty. IMPRESSION: No active cardiopulmonary disease. Aortic Atherosclerosis (ICD10-I70.0). Electronically Signed   By: Marijo Conception, M.D.   On: 10/07/2018 09:24    Assessment and Plan:   1. Acute on chronic diastolic heart failure 2. Hypokalemia 3. DOE, lower extremity swelling - home regimen: 25 mg spironolactone and 40 mEq potassium BID - pro-BNP elevated - K 3.8 now with supplementation - She has not been taking lasix, as instructed - plan to replace with 60 mEq k-dur BID - 20 mg IV lasix given one time yesterday with weight change of 72.9 --> 70.4 kg - BMP in the AM and PM to evaluate potassium - K normalized this afternoon, we will reinitiate diuresis tomorrow with lasix 40 mg IV in the morning and we will recheck and electrolyte panel at 2pm tomorrow. Can redose lasix in the afternoon as needed.  4. Hx of PE, DVTs 5. Hx of GI bleed - no anticoagulation - IVC filter in place   6. Pain with urination - UA negative    7. Mesenteric ischemia - followed by vascular     For questions or updates, please contact [place] Please consult [email or web address] for contact info under   Signed, Elouise Munroe, MD  10/07/2018 10:50 PM

## 2018-10-07 NOTE — Progress Notes (Signed)
Patient had been sick for a long time and is discouraged over her chronic health issues.  She described being inside the hospital and missing entire change of seasons.  She did share a great joy in her life is her brother and sister and a new great great granddaughter who bring her joy.  We spoke of having a big picture of her and baby made to hang on her wall to help lift her spirits when she has the tough days.   Prayed about her concerns and for peace in her mind and heart. Great time with patient. Conard Novak, Chaplain   10/07/18 1600  Clinical Encounter Type  Visited With Patient  Visit Type Initial;Psychological support;Spiritual support;Social support  Referral From Patient  Consult/Referral To Chaplain  Spiritual Encounters  Spiritual Needs Prayer;Emotional;Other (Comment) (encouragement)  Stress Factors  Patient Stress Factors Exhausted;Health changes;Other (Comment) (in hospital over 50 days and in declining health-per patient)

## 2018-10-08 ENCOUNTER — Encounter (HOSPITAL_COMMUNITY): Payer: Self-pay | Admitting: *Deleted

## 2018-10-08 DIAGNOSIS — E876 Hypokalemia: Secondary | ICD-10-CM

## 2018-10-08 DIAGNOSIS — T502X5A Adverse effect of carbonic-anhydrase inhibitors, benzothiadiazides and other diuretics, initial encounter: Secondary | ICD-10-CM

## 2018-10-08 HISTORY — DX: Hypokalemia: E87.6

## 2018-10-08 HISTORY — DX: Hypokalemia: T50.2X5A

## 2018-10-08 LAB — BASIC METABOLIC PANEL
Anion gap: 8 (ref 5–15)
BUN: 19 mg/dL (ref 8–23)
CO2: 28 mmol/L (ref 22–32)
Calcium: 9.7 mg/dL (ref 8.9–10.3)
Chloride: 102 mmol/L (ref 98–111)
Creatinine, Ser: 1.02 mg/dL — ABNORMAL HIGH (ref 0.44–1.00)
GFR calc Af Amer: 59 mL/min — ABNORMAL LOW (ref >60)
GFR calc non Af Amer: 51 mL/min — ABNORMAL LOW (ref >60)
Glucose, Bld: 85 mg/dL (ref 70–99)
Potassium: 5 mmol/L (ref 3.5–5.1)
Sodium: 138 mmol/L (ref 135–145)

## 2018-10-08 LAB — ELECTROLYTE PANEL
Anion gap: 9 (ref 5–15)
CO2: 27 mmol/L (ref 22–32)
Chloride: 105 mmol/L (ref 98–111)
Potassium: 5.1 mmol/L (ref 3.5–5.1)
Sodium: 141 mmol/L (ref 135–145)

## 2018-10-08 MED ORDER — POTASSIUM CHLORIDE CRYS ER 20 MEQ PO TBCR: 40.0000 meq | EXTENDED_RELEASE_TABLET | Freq: Every day | ORAL | Status: DC

## 2018-10-08 NOTE — Care Management Important Message (Signed)
Important Message  Patient Details  Name: [name] MRN: [id number] Date of Birth: December 11, 1941   Medicare Important Message Given:  Yes    [name] 10/08/2018, 2:27 PM

## 2018-10-08 NOTE — Progress Notes (Addendum)
Progress Note  Patient Name: NASHAE MAUDLIN Date of Encounter: 10/08/2018  Primary Cardiologist:  Dorris Carnes, MD  Subjective   She is at her dry weight, weighs daily at home. However, she is still very SOB w/ exertion.  Quit tobacco 2002, no recent illness. Is compliant w/ meds, they come pre-packaged from pharmacy. Take K+ w/ applesauce   Inpatient Medications    Scheduled Meds: . aspirin EC  81 mg Oral Daily  . atorvastatin  20 mg Oral Daily  . citalopram  30 mg Oral Daily  . feeding supplement (ENSURE ENLIVE)  237 mL Oral TID BM  . ferrous sulfate  325 mg Oral Q breakfast  . furosemide  40 mg Intravenous Daily  . heparin  5,000 Units Subcutaneous Q8H  . magnesium oxide  400 mg Oral BID  . pilocarpine  1 drop Both Eyes BID  . potassium chloride  60 mEq Oral BID  . pramipexole  0.25 mg Oral QHS  . sodium chloride flush  3 mL Intravenous Q12H  . spironolactone  25 mg Oral Daily  . traZODone  150 mg Oral QHS  . umeclidinium bromide  1 puff Inhalation Daily   Continuous Infusions: . sodium chloride     PRN Meds: sodium chloride, acetaminophen, cyclobenzaprine, HYDROcodone-acetaminophen, ipratropium-albuterol, ondansetron (ZOFRAN) IV, sodium chloride flush   Vital Signs    Vitals:   10/07/18 0927 10/07/18 1358 10/07/18 2013 10/08/18 0552  BP:  (!) 131/58 (!) 124/54 (!) 131/99  Pulse: 86 74 75 85  Resp: 17  18 18   Temp:  97.6 F (36.4 C) 98 F (36.7 C) 98.6 F (37 C)  TempSrc:  Oral Oral Oral  SpO2: 92% 100% 94% 92%  Weight:    71.4 kg  Height:        Intake/Output Summary (Last 24 hours) at 10/08/2018 0904 Last data filed at 10/08/2018 0646 Gross per 24 hour  Intake 363 ml  Output -  Net 363 ml   Filed Weights   10/06/18 1629 10/07/18 0629 10/08/18 0552  Weight: 72.9 kg 70.4 kg 71.4 kg    Telemetry    SR, no sig ectopy - Personally Reviewed  ECG    None today - Personally Reviewed  Physical Exam   General: Well developed, well nourished,  female appearing in no acute distress. Head: Normocephalic, atraumatic.  Neck: Supple without bruits, JVD 9-10 cm. Lungs:  Resp regular and unlabored, decreased BS bases w/ some fine rales. Heart: RRR, S1, S2, no S3, S4, or murmur; no rub. Abdomen: Soft, non-tender, non-distended with normoactive bowel sounds. No hepatomegaly. No rebound/guarding. No obvious abdominal masses. Extremities: No clubbing, cyanosis, trace edema. Distal pedal pulses are 2+ bilaterally. Neuro: Alert and oriented X 3. Moves all extremities spontaneously. Psych: Normal affect.  Labs    Hematology Recent Labs  Lab 10/05/18 0910 10/06/18 2014  WBC 8.1 8.6  RBC 3.94 4.06  HGB 10.9* 11.4*  HCT 33.8* 36.1  MCV 86 88.9  MCH 27.7 28.1  MCHC 32.2 31.6  RDW 17.5* 16.7*  PLT 267 284    Chemistry Recent Labs  Lab 10/05/18 0910  10/06/18 2014 10/07/18 0157 10/07/18 1251 10/08/18 0454  NA 141   < > 140 140 142 138  K 3.3*  --  2.9* 3.3* 3.8 5.0  CL 100  --  95* 100 102 102  CO2 33*  --  33* 32 31 28  GLUCOSE 90   < > 88 89 159* 85  BUN 14   < >  18 15 16 19   CREATININE 1.00  --  1.22* 1.14* 1.16* 1.02*  CALCIUM 9.3  --  9.2 8.8* 9.1 9.7  PROT 6.3  --  6.9  --   --   --   ALBUMIN 3.8  --  3.2*  --   --   --   AST 9  --  13*  --   --   --   ALT <5  --  8  --   --   --   ALKPHOS 87  --  83  --   --   --   BILITOT 0.2  --  0.3  --   --   --   GFRNONAA 54*  --  41* 45* 44* 51*  GFRAA 62  --  48* 52* 51* 59*  ANIONGAP  --    < > 12 8 9 8    < > = values in this interval not displayed.      BNP Recent Labs  Lab 10/05/18 0910 10/06/18 2014  BNP  --  124.2*  PROBNP 1,048*  --      Radiology    Dg Chest 2 View  Result Date: 10/07/2018 CLINICAL DATA:  Dyspnea. EXAM: CHEST - 2 VIEW COMPARISON:  Radiographs of April 01, 2018. FINDINGS: Stable cardiomediastinal silhouette. Atherosclerosis of thoracic aorta is noted. Status post coronary artery bypass graft. Both lungs are clear. No pneumothorax or  pleural effusion is noted. Status post right shoulder arthroplasty. IMPRESSION: No active cardiopulmonary disease. Aortic Atherosclerosis (ICD10-I70.0). Electronically Signed   By: Marijo Conception, M.D.   On: 10/07/2018 09:24    Cardiac Studies   None ordered  Patient Profile     77 y.o. female w/ hx CAD, carotid artery disease, chronic diastolic heart failure, recurrent DVTs, PE status post IVC filter, upper GI bleed, HTN, HLD, and PVD (follows with vascular) was admitted 10/16 for management of CHF and hypokalemia.  Assessment & Plan    Principal Problem:   Acute on chronic diastolic heart failure ([place]) - feel like volume status is close to baseline - continue IV Lasix 40 mg qd today, possible change to po tomorrow - CXR not that bad, BNP not that high and she is not obese - may have some chronic lung problems>>ck sats w/ ambulation, may qualify for home O2.  Active Problems:   Diuretic-induced hypokalemia - home regimen 40 meq bid - started on 60 meq bid and K+ went from 2.9>>5.0 in 48 hr - encourage med compliance as outpt>> she takes what the pharmacy sends her - will cut back to 40 meq qd today - continue to follow closely.   Jonetta Speak , PA-C 9:04 AM 10/08/2018 Pager: [phone] ---------------------------------------------------------------------------------------------   History and all data above reviewed.  Patient examined.  I agree with the findings as above.  SHANYN PREISLER is feeling a little better today.  Constitutional: No acute distress ENMT: moist mucous membranes Cardiovascular: regular rhythm, normal rate, no murmurs. S1 and S2 normal. Radial pulses normal bilaterally. No jugular venous distention.  Respiratory: clear to auscultation bilaterally GI : normal bowel sounds, soft and nontender. No distention.   MSK: extremities warm, well perfused. Trace edema.  LYMPH: No lymphadenopathy noted of the head and neck NEURO: grossly nonfocal  exam, moves all extremities. PSYCH: alert and oriented x 3, normal mood and affect.   All available labs, radiology testing, previous records reviewed. Agree with documented assessment and plan of my colleague as stated  above with the following additions or changes:  Principal Problem:   Acute on chronic diastolic heart failure (HCC) Active Problems:   Diuretic-induced hypokalemia    Plan: agree as noted above, will scale back on potassium supplementation to 73mEq today and will consider transition to po lasix tomorrow.   Elouise Munroe, MD [facility] 7:02 PM  10/08/2018

## 2018-10-08 NOTE — Progress Notes (Signed)
SATURATION QUALIFICATIONS: (This note is used to comply with regulatory documentation for home oxygen)  Patient Saturations on Room Air at Rest = 93%  Patient Saturations on Room Air while Ambulating = 88%  Patient Saturations on 2 Liters of oxygen while Ambulating = 95%  Please briefly explain why patient needs home oxygen: 

## 2018-10-09 ENCOUNTER — Encounter (HOSPITAL_COMMUNITY): Payer: Self-pay | Admitting: *Deleted

## 2018-10-09 LAB — BASIC METABOLIC PANEL
Anion gap: 7 (ref 5–15)
BUN: 20 mg/dL (ref 8–23)
CO2: 29 mmol/L (ref 22–32)
Calcium: 10 mg/dL (ref 8.9–10.3)
Chloride: 102 mmol/L (ref 98–111)
Creatinine, Ser: 1.05 mg/dL — ABNORMAL HIGH (ref 0.44–1.00)
GFR calc Af Amer: 57 mL/min — ABNORMAL LOW (ref >60)
GFR calc non Af Amer: 50 mL/min — ABNORMAL LOW (ref >60)
Glucose, Bld: 113 mg/dL — ABNORMAL HIGH (ref 70–99)
Potassium: 5.3 mmol/L — ABNORMAL HIGH (ref 3.5–5.1)
Sodium: 138 mmol/L (ref 135–145)

## 2018-10-09 MED ORDER — FUROSEMIDE 40 MG PO TABS
40.0000 mg | ORAL_TABLET | Freq: Two times a day (BID) | ORAL | Status: DC
  Administered 2018-10-10: 40 mg via ORAL
  Filled 2018-10-09: qty 1

## 2018-10-09 MED ORDER — POTASSIUM CHLORIDE CRYS ER 20 MEQ PO TBCR: 40.0000 meq | EXTENDED_RELEASE_TABLET | Freq: Every day | ORAL | Status: DC

## 2018-10-09 NOTE — Progress Notes (Signed)
Progress Note  Patient Name: [name] Date of Encounter: 10/09/2018  Primary Cardiologist:  Dorris Carnes, MD  Subjective   Feels well enough to go home, but then suggests perhaps one more day due to follow up plan.  Inpatient Medications    Scheduled Meds: . aspirin EC  81 mg Oral Daily  . atorvastatin  20 mg Oral Daily  . citalopram  30 mg Oral Daily  . feeding supplement (ENSURE ENLIVE)  237 mL Oral TID BM  . ferrous sulfate  325 mg Oral Q breakfast  . [START ON 10/10/2018] furosemide  40 mg Oral BID  . heparin  5,000 Units Subcutaneous Q8H  . magnesium oxide  400 mg Oral BID  . pilocarpine  1 drop Both Eyes BID  . [START ON 10/10/2018] potassium chloride  40 mEq Oral Daily  . pramipexole  0.25 mg Oral QHS  . sodium chloride flush  3 mL Intravenous Q12H  . spironolactone  25 mg Oral Daily  . traZODone  150 mg Oral QHS  . umeclidinium bromide  1 puff Inhalation Daily   Continuous Infusions: . sodium chloride     PRN Meds: sodium chloride, acetaminophen, cyclobenzaprine, HYDROcodone-acetaminophen, ipratropium-albuterol, ondansetron (ZOFRAN) IV, sodium chloride flush   Vital Signs    Vitals:   10/08/18 1130 10/08/18 1423 10/08/18 2145 10/09/18 0622  BP:  (!) 142/71 118/66 117/60  Pulse:  93 79 87  Resp:   16 18  Temp:  97.7 F (36.5 C)  98.1 F (36.7 C)  TempSrc:  Oral  Oral  SpO2: 94% 92% 94% 95%  Weight:    71 kg  Height:        Intake/Output Summary (Last 24 hours) at 10/09/2018 1128 Last data filed at 10/08/2018 1420 Gross per 24 hour  Intake 240 ml  Output -  Net 240 ml   Filed Weights   10/07/18 0629 10/08/18 0552 10/09/18 0622  Weight: 70.4 kg 71.4 kg 71 kg    Telemetry    SR, no sig ectopy - Personally Reviewed  ECG    None today - Personally Reviewed  Physical Exam   Constitutional: No acute distress ENMT: moist mucous membranes Cardiovascular: regular rhythm, normal rate, no murmurs. S1 and S2 normal. Radial pulses normal  bilaterally. No jugular venous distention.  Respiratory: clear to auscultation bilaterally GI : normal bowel sounds, soft and nontender. No distention.   MSK: extremities warm, well perfused. No edema.  NEURO: grossly nonfocal exam, moves all extremities. PSYCH: alert and oriented x 3, normal mood and affect.   Labs    Hematology Recent Labs  Lab 10/05/18 0910 10/06/18 2014  WBC 8.1 8.6  RBC 3.94 4.06  HGB 10.9* 11.4*  HCT 33.8* 36.1  MCV 86 88.9  MCH 27.7 28.1  MCHC 32.2 31.6  RDW 17.5* 16.7*  PLT 267 284    Chemistry Recent Labs  Lab 10/05/18 0910 10/06/18 2014  10/07/18 1251 10/08/18 0454 10/08/18 1332 10/09/18 0548  NA 141 140   < > 142 138 141 138  K 3.3* 2.9*   < > 3.8 5.0 5.1 5.3*  CL 100 95*   < > 102 102 105 102  CO2 33* 33*   < > 31 28 27 29   GLUCOSE 90 88   < > 159* 85  --  113*  BUN 14 18   < > 16 19  --  20  CREATININE 1.00 1.22*   < > 1.16* 1.02*  --  1.05*  CALCIUM 9.3 9.2   < > 9.1 9.7  --  10.0  PROT 6.3 6.9  --   --   --   --   --   ALBUMIN 3.8 3.2*  --   --   --   --   --   AST 9 13*  --   --   --   --   --   ALT <5 8  --   --   --   --   --   ALKPHOS 87 83  --   --   --   --   --   BILITOT 0.2 0.3  --   --   --   --   --   GFRNONAA 54* 41*   < > 44* 51*  --  50*  GFRAA 62 48*   < > 51* 59*  --  57*  ANIONGAP  --  12   < > 9 8 9 7    < > = values in this interval not displayed.      BNP Recent Labs  Lab 10/05/18 0910 10/06/18 2014  BNP  --  124.2*  PROBNP 1,048*  --      Radiology    Dg Chest 2 View  Result Date: 10/07/2018 CLINICAL DATA:  Dyspnea. EXAM: CHEST - 2 VIEW COMPARISON:  Radiographs of April 01, 2018. FINDINGS: Stable cardiomediastinal silhouette. Atherosclerosis of thoracic aorta is noted. Status post coronary artery bypass graft. Both lungs are clear. No pneumothorax or pleural effusion is noted. Status post right shoulder arthroplasty. IMPRESSION: No active cardiopulmonary disease. Aortic Atherosclerosis  (ICD10-I70.0). Electronically Signed   By: Marijo Conception, M.D.   On: 10/07/2018 09:24    Cardiac Studies   None ordered  Patient Profile     77 y.o. female w/ hx CAD, carotid artery disease, chronic diastolic heart failure, recurrent DVTs, PE status post IVC filter, upper GI bleed, HTN, HLD, and PVD (follows with vascular) was admitted 10/16 for management of CHF and hypokalemia.  Assessment & Plan  Principal Problem:   Acute on chronic diastolic heart failure (HCC) Active Problems:   Diuretic-induced hypokalemia  Principal Problem:   Acute on chronic diastolic heart failure (HCC) - feel like volume status is close to baseline - transition to 40 mg BID po lasix tomorrow, home dose.  Active Problems:   Diuretic-induced hypokalemia, resolved Now potassium is 5.3, will discontinue supplementation today and resume tomorrow after checking.   Elouise Munroe, MD [facility] 11:28 AM  10/09/2018

## 2018-10-10 ENCOUNTER — Other Ambulatory Visit: Payer: Self-pay | Admitting: Physician Assistant

## 2018-10-10 ENCOUNTER — Encounter (HOSPITAL_COMMUNITY): Payer: Self-pay

## 2018-10-10 DIAGNOSIS — E876 Hypokalemia: Secondary | ICD-10-CM

## 2018-10-10 DIAGNOSIS — I10 Essential (primary) hypertension: Secondary | ICD-10-CM

## 2018-10-10 LAB — CBC
HCT: 36.4 % (ref 36.0–46.0)
Hemoglobin: 11.3 g/dL — ABNORMAL LOW (ref 12.0–15.0)
MCH: 27.7 pg (ref 26.0–34.0)
MCHC: 31 g/dL (ref 30.0–36.0)
MCV: 89.2 fL (ref 80.0–100.0)
Platelets: 258 10*3/uL (ref 150–400)
RBC: 4.08 MIL/uL (ref 3.87–5.11)
RDW: 17 % — ABNORMAL HIGH (ref 11.5–15.5)
WBC: 7.7 10*3/uL (ref 4.0–10.5)
nRBC: 0 % (ref 0.0–0.2)

## 2018-10-10 LAB — BASIC METABOLIC PANEL
Anion gap: 8 (ref 5–15)
BUN: 24 mg/dL — ABNORMAL HIGH (ref 8–23)
CO2: 30 mmol/L (ref 22–32)
Calcium: 9.6 mg/dL (ref 8.9–10.3)
Chloride: 101 mmol/L (ref 98–111)
Creatinine, Ser: 1.08 mg/dL — ABNORMAL HIGH (ref 0.44–1.00)
GFR calc Af Amer: 55 mL/min — ABNORMAL LOW (ref >60)
GFR calc non Af Amer: 48 mL/min — ABNORMAL LOW (ref >60)
Glucose, Bld: 99 mg/dL (ref 70–99)
Potassium: 5 mmol/L (ref 3.5–5.1)
Sodium: 139 mmol/L (ref 135–145)

## 2018-10-10 MED ORDER — POTASSIUM CHLORIDE CRYS ER 10 MEQ PO TBCR: 20.0000 meq | EXTENDED_RELEASE_TABLET | Freq: Every day | ORAL | 5 refills | Status: DC

## 2018-10-10 MED ORDER — FUROSEMIDE 40 MG PO TABS: 40.0000 mg | ORAL_TABLET | Freq: Every day | ORAL | 6 refills | Status: DC

## 2018-10-10 MED ORDER — NON FORMULARY: 3.0000 mg | Freq: Once | Status: DC

## 2018-10-10 MED ORDER — MELATONIN 3 MG PO TABS
3.0000 mg | ORAL_TABLET | Freq: Every evening | ORAL | Status: DC | PRN
  Administered 2018-10-10: 3 mg via ORAL
  Filled 2018-10-10 (×2): qty 1

## 2018-10-10 NOTE — Progress Notes (Signed)
Order for BMET placed. This will need to be completed next week ~ 10/24  Angelena Form PA-C  [facility]

## 2018-10-10 NOTE — Progress Notes (Signed)
Mag ordered

## 2018-10-10 NOTE — Discharge Summary (Addendum)
Discharge Summary    Patient ID: [name] MRN: [id number]; DOB: 30-May-1941  Admit date: 10/06/2018 Discharge date: 10/10/2018  Primary Care Provider: [name], MD  Primary Cardiologist: Dorris Carnes, MD  Primary Electrophysiologist:  None   Discharge Diagnoses    Principal Problem:   Acute on chronic diastolic heart failure [facility]) Active Problems:   Hypothyroidism   Essential hypertension   COPD (chronic obstructive pulmonary disease) ([place])   Sleep apnea   CAD (coronary artery disease)   Dyslipidemia   PAD (peripheral artery disease) ([place])   Pulmonary embolism (HCC)   GERD without esophagitis   Chronic gout   Diuretic-induced hypokalemia   Allergies Allergies  Allergen Reactions  . Zofran [Ondansetron Hcl] Nausea Only  . Zolpidem Tartrate Anxiety and Other (See Comments)    CONFUSION   . Ativan [Lorazepam] Other (See Comments)    Confusion   . Pitavastatin Other (See Comments)    Not sure  . Ropinirole Nausea Only and Other (See Comments)    Makes legs jump  . Penicillins Rash and Other (See Comments)    Tolerated cephalosporins in past Has patient had a PCN reaction causing immediate rash, facial/tongue/throat swelling, SOB or lightheadedness with hypotension: Yes Has patient had a PCN reaction causing severe rash involving mucus membranes or skin necrosis: No Has patient had a PCN reaction that required hospitalization: No Has patient had a PCN reaction occurring within the last 10 years: No If all of the above answers are "NO", then may proceed with Cephalosporin use.     Diagnostic Studies/Procedures    none _____________   History of Present Illness     [name] is a 77 y.o. female with a history of CAD, PAD, chronic diastolic CHF, recurrent DVTs, PE s/p IVC filter, history of GI bleeding, HTN, HLD and cirrhosis who was admitted to [facility] on 10/06/18 for diuretic induced hypokalemia and acute on chronic CHF.  She has a history of CAD S/P  CABG in 2004 and DES to left main.  She has history of upper GI bleeding.  EGD showed gastric erosions, AVMs.  Her anticoagulation was discontinued.   In 02/2018,she was admitted with pulmonary emboli and acute DVT in L femoral vein along with ischemic colitis. Found to have a SMA stenosis and underwent stenting by vascular surgery. Hospitalized with acute anemia due to abdominal hematoma on heparin. IVC filter placed.Also underwent paracentesis for ascites. CT with cirrhosis.   Patient had abnormal labs with a potassium of 2.7 in the outpatient setting.  Her potassium was increased and Lasix discontinued.  She was started on spironolactone 25 mg daily.  She developed dyspnea on exertion and lower extremity edema as well as fatigue.  Given abnormal labs and failing outpatient diuresis is was decided to admit her to the hospital for further management.  Hospital Course     Consultants: none  Acute on chronic diastolic CHF: she was treated with IV lasix. Weight down 7lbs ( 160--> 153 lbs). This her dry weight. I/Os not accurate. Plan to discharge her on Lasix 40mg  daily, spiro 25mg  daily and Kdur 20 Meq daily. I will arrange for a BMET in 1 week and adjust potassium if necessary. She will also need a TOC visit in the next 1-2 weeks for follow up.  Hypokalemia: this resolved with supplementation. She was actually over-supplemented and K peaked at 5.3. K is now being held. 5.0 today. Will plan to hold K supplementation today and resume Kdur at 20  Meq daily starting tomorrow. Bmet next week as above. Also plan to check mag  HTN: BP well controlled  CAD/PAD: stable. Continue medical management with ASA and statin   Hx of DVT/PE: not on anticoagulation due to hx of GI bleeding. S/p IVC filter _____________  Discharge Vitals Blood pressure (!) 113/55, pulse 78, temperature (!) 97.5 F (36.4 C), temperature source Tympanic, resp. rate 17, height 5\' 8"  (1.727 m), weight 69.8 kg, SpO2 90 %.  Filed  Weights   10/08/18 0552 10/09/18 0622 10/10/18 0514  Weight: 71.4 kg 71 kg 69.8 kg   VS:  BP (!) 113/55 (BP Location: Right Arm)   Pulse 78   Temp (!) 97.5 F (36.4 C) (Tympanic)   Resp 17   Ht 5\' 8"  (1.727 m)   Wt 69.8 kg   SpO2 90%   BMI 23.40 kg/m    GEN: Well nourished, well developed, in no acute distress HEENT: normal Neck: no JVD or masses Cardiac: RRR; no murmurs, rubs, or gallops. Trace bilateral LE edema Respiratory:  clear to auscultation bilaterally, normal work of breathing GI: soft, nontender, nondistended, + BS MS: no deformity or atrophy Skin: warm and dry, no rash Neuro:  Alert and Oriented x 3, Strength and sensation are intact Psych: euthymic mood, full affect   Labs & Radiologic Studies    CBC Recent Labs    10/10/18 0503  WBC 7.7  HGB 11.3*  HCT 36.4  MCV 89.2  PLT 811   Basic Metabolic Panel Recent Labs    10/09/18 0548 10/10/18 0503  NA 138 139  K 5.3* 5.0  CL 102 101  CO2 29 30  GLUCOSE 113* 99  BUN 20 24*  CREATININE 1.05* 1.08*  CALCIUM 10.0 9.6   Liver Function Tests No results for input(s): AST, ALT, ALKPHOS, BILITOT, PROT, ALBUMIN in the last 72 hours. No results for input(s): LIPASE, AMYLASE in the last 72 hours. Cardiac Enzymes No results for input(s): CKTOTAL, CKMB, CKMBINDEX, TROPONINI in the last 72 hours. BNP Invalid input(s): POCBNP D-Dimer No results for input(s): DDIMER in the last 72 hours. Hemoglobin A1C No results for input(s): HGBA1C in the last 72 hours. Fasting Lipid Panel No results for input(s): CHOL, HDL, LDLCALC, TRIG, CHOLHDL, LDLDIRECT in the last 72 hours. Thyroid Function Tests No results for input(s): TSH, T4TOTAL, T3FREE, THYROIDAB in the last 72 hours.  Invalid input(s): FREET3 _____________  Dg Chest 2 View  Result Date: 10/07/2018 CLINICAL DATA:  Dyspnea. EXAM: CHEST - 2 VIEW COMPARISON:  Radiographs of April 01, 2018. FINDINGS: Stable cardiomediastinal silhouette. Atherosclerosis of  thoracic aorta is noted. Status post coronary artery bypass graft. Both lungs are clear. No pneumothorax or pleural effusion is noted. Status post right shoulder arthroplasty. IMPRESSION: No active cardiopulmonary disease. Aortic Atherosclerosis (ICD10-I70.0). Electronically Signed   By: Marijo Conception, M.D.   On: 10/07/2018 09:24   Disposition   Pt is being discharged home today in good condition.  Follow-up Plans & Appointments    Follow-up Information    Fay Records, MD Follow up.   Specialty:  Cardiology Why:  The office will call you to arrange lab work and a follow up visit  Contact information: [place] [address] [phone]            Discharge Medications   Allergies as of 10/10/2018      Reactions   Zofran Alvis Lemmings Hcl] Nausea Only   Zolpidem Tartrate Anxiety, Other (See Comments)  CONFUSION    Ativan [lorazepam] Other (See Comments)   Confusion    Pitavastatin Other (See Comments)   Not sure   Ropinirole Nausea Only, Other (See Comments)   Makes legs jump   Penicillins Rash, Other (See Comments)   Tolerated cephalosporins in past Has patient had a PCN reaction causing immediate rash, facial/tongue/throat swelling, SOB or lightheadedness with hypotension: Yes Has patient had a PCN reaction causing severe rash involving mucus membranes or skin necrosis: No Has patient had a PCN reaction that required hospitalization: No Has patient had a PCN reaction occurring within the last 10 years: No If all of the above answers are "NO", then may proceed with Cephalosporin use.      Medication List    TAKE these medications   allopurinol 100 MG tablet Commonly known as:  ZYLOPRIM Take 100 mg by mouth daily.   aspirin EC 81 MG tablet Take 81 mg by mouth daily.   atorvastatin 20 MG tablet Commonly known as:  LIPITOR Take 1 tablet (20 mg total) by mouth daily.   citalopram 20 MG tablet Commonly known as:  CELEXA Take  30 mg by mouth at bedtime.   [employer] D3 1000 units capsule Generic drug:  Cholecalciferol Take 1,000 Units by mouth daily.   cyclobenzaprine 10 MG tablet Commonly known as:  FLEXERIL Take 10 mg by mouth 3 (three) times daily.   feeding supplement (ENSURE ENLIVE) Liqd Take 237 mLs by mouth 3 (three) times daily between meals.   ferrous sulfate 325 (65 FE) MG tablet Take 325 mg by mouth daily with breakfast.   furosemide 40 MG tablet Commonly known as:  LASIX Take 1 tablet (40 mg total) by mouth daily. What changed:  when to take this   HYDROcodone-acetaminophen 5-325 MG tablet Commonly known as:  NORCO/VICODIN Take 1 tablet by mouth every 4 (four) hours as needed for moderate pain.   INCRUSE ELLIPTA 62.5 MCG/INH Aepb Generic drug:  umeclidinium bromide Inhale 1 puff into the lungs 2 (two) times daily.   Ipratropium-Albuterol 20-100 MCG/ACT Aers respimat Commonly known as:  COMBIVENT Inhale 1 puff into the lungs every 6 (six) hours as needed for wheezing.   ipratropium-albuterol 0.5-2.5 (3) MG/3ML Soln Commonly known as:  DUONEB Take 3 mLs by nebulization 4 (four) times daily as needed (for shortness of breath).   levothyroxine 25 MCG tablet Commonly known as:  SYNTHROID, LEVOTHROID Take 25 mcg by mouth daily before breakfast.   magnesium oxide 400 (241.3 Mg) MG tablet Commonly known as:  MAG-OX Take 1 tablet (400 mg total) by mouth 2 (two) times daily.   nitroGLYCERIN 0.4 MG SL tablet Commonly known as:  NITROSTAT Place 1 tablet (0.4 mg total) under the tongue every 5 (five) minutes as needed for chest pain. For chest pain   omeprazole 40 MG capsule Commonly known as:  PRILOSEC Take 40 mg by mouth at bedtime.   Oxycodone HCl 10 MG Tabs Take 1 tablet (10 mg total) by mouth every 6 (six) hours as needed. What changed:  reasons to take this   pilocarpine 1 % ophthalmic solution Commonly known as:  PILOCAR Place 1 drop into both eyes 2 (two) times daily.     potassium chloride 10 MEQ tablet Commonly known as:  K-DUR,KLOR-CON Take 2 tablets (20 mEq total) by mouth daily. Start taking on:  10/11/2018 What changed:    how much to take  when to take this   pramipexole 0.25 MG tablet Commonly known as:  MIRAPEX  Take 0.25 mg by mouth at bedtime.   roflumilast 500 MCG Tabs tablet Commonly known as:  DALIRESP Take 500 mcg by mouth daily.   spironolactone 25 MG tablet Commonly known as:  ALDACTONE Take 1 tablet (25 mg total) by mouth daily.        Acute coronary syndrome (MI, NSTEMI, STEMI, etc) this admission?: No.    Outstanding Labs/Studies   BMET, mag  Duration of Discharge Encounter   Greater than 30 minutes including physician time.  Signed, Angelena Form, PA-C 10/10/2018, 8:46 AM ---------------------------------------------------------------------------------------------   History and all data above reviewed.  Patient examined.  I agree with the findings as above.  [name] feels well today and is ready to go home.  Constitutional: No acute distress ENMT: normal dentition, moist mucous membranes Cardiovascular: regular rhythm, normal rate, no murmurs. S1 and S2 normal. Radial pulses normal bilaterally. No jugular venous distention.  Respiratory: clear to auscultation bilaterally GI : normal bowel sounds, soft and nontender. No distention.   MSK: extremities warm, well perfused. Trace edema.  NEURO: grossly nonfocal exam, moves all extremities. PSYCH: alert and oriented x 3, normal mood and affect.   All available labs, radiology testing, previous records reviewed. Agree with documented assessment and plan of my colleague as stated above with the following additions or changes:  Principal Problem:   Acute on chronic diastolic heart failure (HCC) Active Problems:   Hypothyroidism   Essential hypertension   COPD (chronic obstructive pulmonary disease) (HCC)   Sleep apnea   CAD (coronary artery  disease)   Dyslipidemia   PAD (peripheral artery disease) ([place])   Pulmonary embolism (HCC)   GERD without esophagitis   Chronic gout   Diuretic-induced hypokalemia    Plan: She will be dismissed on the following regimen: - lasix 40 mg daily - potassium 20 mEq daily - spironolactone 25 mg daily -mag ox 400 mg BID  Her potassium is 5.0 today, so we will have her hold today's potassium dose. She is euvolemic.  We will have her seen in 1-2 weeks in [facility] with a electrolyte panel and magnesium in 1 week.   Elouise Munroe, MD [facility] 8:50 AM  10/10/2018

## 2018-10-10 NOTE — Progress Notes (Signed)
PT discharged in stable condition to private vehicle. Patients daughter taking her home. I reviewed d/c instructions with patient and CHF education. NO further questions at this time.

## 2018-10-12 ENCOUNTER — Other Ambulatory Visit: Payer: Self-pay | Admitting: *Deleted

## 2018-10-12 NOTE — Patient Outreach (Addendum)
[facility]) Care Management  10/12/2018  [name] [birth year] [id number]   Transition of Care Referral   Referral Date: 10/122/19 Referral Source: [employer] inpatient discharges  Date of Admission:  Diagnosis:  Date of Discharge: on 10/09/18 Facility: [facility]   Insurance:  [place] attempt # 1  Patient is able to verify HIPAA Reviewed and addressed Transitional of care referral with patient She reports she is doing better and is glad to be home after a long stay of 51 days in and out of the hospital  She confirms she is weighing daily Wt now/this am 152 lb was 160 lb  Cm discussed and encouraged adherence to low sodium heart health diet She reports having difficulty not using salt She is familiar with [name] Cm discussed some other alternatives other than salt She reports following diet about 75% of time  Transition of care assessment completed  Discussed calling her MD for any swelling of her ankles She does elevate them and uses her compression hose. She confirms her MD has given her permission to increase her lasix dose when she is having increase in wt value  She reports some sob on exertion especially when she is "doing housework"  and was encouraged to use her oxygen prn   Social: [name] is widow and lives at home alone but reports she has lots of family that visit "all day" During the assessment her son [name] arrived She also has a daughter, sister and grandchildren Her sister lives in [place]. She confirms she likes to keep active She reports she is independent with her ADLs and gets assist with iADLs at times. Her family assists with transportation to medical appointments   Conditions: HTN, CAD s/p CABG 2004, PAD, pulmonary embolism , diastolic HF, hypothyroidism , GERD, UTI Dyslipidemia, blood loss anemia, gout, chronic bilateral low back pain sleep apnea COPD, former smoker stopped in 2002    Medications: [facility] denies concerns with taking medications as prescribed, affording medications, side effects of medications and questions about medications  DME dentures, scales, oxygen (states she uses $ L at night to sleep better) , nebulizer, cane, walker, w/c, built in shower seat, hand held shower hose Appointments: she is scheduled to follow up with her cardiologist  She has a pulmonologist and admits she need to see pulmonologist more but has been trying to see her other specialists    Advance directives: She has a living will and her son, [name], is her POA She is not interested in making changes   Consent: [facility] RN CM reviewed [facility] services with patient. Patient gave verbal consent for services. Advised patient that other post discharge calls may occur to assess how the patient is doing following the recent hospitalization. Patient voiced understanding and was appreciative of f/u call.  Plan: [facility] RN CM will close case at this time as patient has been assessed and no needs identified.  [facility] RN CM sent a successful outreach letter as discussed with [facility] brochure enclosed for review Pt encouraged to return a call to [name] CM prn Route note to primary and cardiology MDs  [facility] Violet L. Lavina Hamman, RN, BSN, [facility] Care Coordinator Direct Number [phone] Mobile number [phone]  Main [facility] number [phone] Fax number [phone]

## 2018-10-19 ENCOUNTER — Other Ambulatory Visit: Payer: Medicare HMO

## 2018-10-19 DIAGNOSIS — E876 Hypokalemia: Secondary | ICD-10-CM

## 2018-10-19 LAB — BASIC METABOLIC PANEL
BUN/Creatinine Ratio: 15 (ref 12–28)
BUN: 20 mg/dL (ref 8–27)
CO2: 26 mmol/L (ref 20–29)
Calcium: 9.5 mg/dL (ref 8.7–10.3)
Chloride: 102 mmol/L (ref 96–106)
Creatinine, Ser: 1.34 mg/dL — ABNORMAL HIGH (ref 0.57–1.00)
GFR calc Af Amer: 44 mL/min/{1.73_m2} — ABNORMAL LOW (ref >59)
GFR calc non Af Amer: 38 mL/min/{1.73_m2} — ABNORMAL LOW (ref >59)
Glucose: 103 mg/dL — ABNORMAL HIGH (ref 65–99)
Potassium: 4.4 mmol/L (ref 3.5–5.2)
Sodium: 143 mmol/L (ref 134–144)

## 2018-10-19 LAB — MAGNESIUM: Magnesium: 2.1 mg/dL (ref 1.6–2.3)

## 2018-10-21 ENCOUNTER — Ambulatory Visit: Payer: Medicare HMO | Admitting: Physician Assistant

## 2018-10-21 ENCOUNTER — Encounter: Payer: Self-pay | Admitting: Physician Assistant

## 2018-10-21 VITALS — BP 108/50 | HR 60 | Ht 68.0 in | Wt 158.4 lb

## 2018-10-21 DIAGNOSIS — K551 Chronic vascular disorders of intestine: Secondary | ICD-10-CM

## 2018-10-21 DIAGNOSIS — I739 Peripheral vascular disease, unspecified: Secondary | ICD-10-CM

## 2018-10-21 DIAGNOSIS — Z86711 Personal history of pulmonary embolism: Secondary | ICD-10-CM

## 2018-10-21 DIAGNOSIS — E876 Hypokalemia: Secondary | ICD-10-CM | POA: Diagnosis not present

## 2018-10-21 DIAGNOSIS — I2510 Atherosclerotic heart disease of native coronary artery without angina pectoris: Secondary | ICD-10-CM

## 2018-10-21 DIAGNOSIS — I779 Disorder of arteries and arterioles, unspecified: Secondary | ICD-10-CM

## 2018-10-21 DIAGNOSIS — I5032 Chronic diastolic (congestive) heart failure: Secondary | ICD-10-CM

## 2018-10-21 NOTE — Progress Notes (Signed)
Cardiology Office Note    Date:  10/21/2018   ID:  [name], DOB November 29, 1941, MRN [id number]  PCP:  [name], MD  Cardiologist: Dr. Harrington Challenger  Chief Complaint: Hospital follow up   History of Present Illness:   [name] is a 77 y.o. female [name] is a 77 y.o. female with a history of CAD, PAD, chronic diastolic CHF, recurrent DVTs, PE s/p IVC filter, history of GI bleeding, HTN, HLD and cirrhosis presents for hospital follow up.   She has a history of CAD S/P CABG in 2004 and DES to left main.  She has history of upper GI bleeding.  EGD showed gastric erosions, AVMs.  Her anticoagulation was discontinued.   In 02/2018,she was admitted with pulmonary emboli and acute DVT in L femoral vein along with ischemic colitis. Found to have a SMA stenosis and underwent stenting by vascular surgery. Hospitalizedwith acute anemia due to abdominal hematoma on heparin. IVC filter placed.Also underwent paracentesis for ascites. CT with cirrhosis.   Recently required multiple medication adjustment for LE edema, DOE and hypokalemia. Failed outpatient therapy and admitted 10/16-10/20. Diuresed with 7lb weight loss (160>>153lb). Discharged her on Lasix 40mg  daily, spiro 25mg  daily and Kdur 20 Meq daily.  Here today for follow up. Weight up now to 158lb. At home scale she gained 3lb since last week.  She drinks more than 2 glasses of tomato juice every day.  She has lower extremity edema with intermittent orthopnea.  Denies chest pain, palpitation, syncope, melena or blood in her stool or urine.   Past Medical History:  Diagnosis Date  . Anemia    bld. transfusion post lumbar surgery- 2012  . Anxiety   . Arthralgia    NOS  . Blood transfusion 2012; 02/2018   WITH BACK SURGERY; "related to hematoma in stomach" (10/06/2018)  . CAD (coronary artery disease)    s/p CABG 2004; s/p DES to LM in 2010;  [place] 10/29/11: EF 50-55%, mild elevated filling pressures, no pulmonary HTN, LM  90% ISR, LAD and CFX occluded, S-RI occluded (old), S-OM3 ok and L-LAD ok, native nondominant RCA 95% -  med rx recommended ; Lexiscan [facility]iew 7/13 at [facility]: demonstrated "normal LV function, anterior attenuation and localized ischemia, inferior, basilar, mid section"  . Carotid artery disease ([place])    Carotid US 7/34:  RICA 2-87; LICA 68-11; R subclavian stenosis - Repeat 1 year.  . CHF (congestive heart failure) ([place])   . Chronic diastolic heart failure (HCC)    Echo 9/10: EF 57-26%, grade 1 diastolic dysfunction  . Chronic lower back pain   . COPD (chronic obstructive pulmonary disease) ([place])    Emphysema dxed by Dr. Woody Seller in [place] based on PFTs per pt in 2006; placed on albuterol  . Depression    TAKES CELEXA  AND  (OFF- WELBUTRIN)  . Diuretic-induced hypokalemia 10/08/2018  . DVT of lower extremity (deep venous thrombosis) (HCC)    recurrent. bilateral (2 episodes)  . Dyspnea   . Dysrhythmia    afib with cabg  . Exertional angina (HCC)    Treated with Isosorbide, Ranexa, amlodipine; intolerant to metoprolol  . GERD (gastroesophageal reflux disease)   . Gout    "on daily RX" (10/06/2018)  . HLD (hyperlipidemia)   . Hypertension   . Hyperthyroidism   . Obesity (BMI 30-39.9) 2009   BMI 33  . Osteoarthritis    "all over; hands" (10/06/2018)  . Oxygen deficiency   .  Oxygen dependent    4L at home as needed (10/06/2018)  . Pneumonia   . PVD (peripheral vascular disease) ([place])   . Sleep apnea 2012   USED CPAP THEN  STARTED USING SPIRIVA , Nov. 2013- last evaluation , changed from mask to aparatus that is just for her nose.. ; reports 05-28-18 "the mask smothers me so i dont use it right now" (10/06/2018)    Past Surgical History:  Procedure Laterality Date  . ANKLE SURGERY  2004  . ANTERIOR CERVICAL DECOMP/DISCECTOMY FUSION  11/2007  . AUGMENTATION MAMMAPLASTY    . [place]   lower; another scheduled, opt. reports 4 back- lumbar, 3 cerv. fusions  for later  2009  . CARDIAC CATHETERIZATION  11/2009   Patent LIMA to LAD and patent SVG to OM1. Occluded SVG to ramus and diagonal. Left main: 90% ostial stenosis, LCX 60-70% proximal stenosis  . CATARACT EXTRACTION W/ INTRAOCULAR LENS  IMPLANT, BILATERAL Bilateral 2006  . COLONOSCOPY WITH PROPOFOL N/A 02/17/2017   Procedure: COLONOSCOPY WITH PROPOFOL;  Surgeon: Jerene Bears, MD;  Location: [facility] ENDOSCOPY;  Service: Endoscopy;  Laterality: N/A;  . CORONARY ANGIOPLASTY WITH STENT PLACEMENT  11/2009   Drug eluting stent to left main artery: 4.0 X 12 mm Ion   . CORONARY ARTERY BYPASS GRAFT  2004   "CABG X4"  . ESOPHAGOGASTRODUODENOSCOPY N/A 02/14/2017   Procedure: ESOPHAGOGASTRODUODENOSCOPY (EGD);  Surgeon: Doran Stabler, MD;  Location: [facility] ENDOSCOPY;  Service: Endoscopy;  Laterality: N/A;  . GIVENS CAPSULE STUDY N/A 02/15/2017   Procedure: GIVENS CAPSULE STUDY;  Surgeon: Doran Stabler, MD;  Location: [place];  Service: Endoscopy;  Laterality: N/A;  . GROIN MASS OPEN BIOPSY  2004  . IR IVC FILTER PLMT / S&I /IMG GUID/MOD SED  03/14/2018  . IR PARACENTESIS  03/10/2018  . JOINT REPLACEMENT    . LAPAROSCOPIC CHOLECYSTECTOMY    . PERIPHERAL VASCULAR INTERVENTION Left 03/05/2018   Procedure: PERIPHERAL VASCULAR INTERVENTION;  Surgeon: Elam Dutch, MD;  Location: [place] CV LAB;  Service: Cardiovascular;  Laterality: Left;  Attempted unsuccess\ful Per Dr. Eden Lathe  . PERIPHERAL VASCULAR INTERVENTION  03/08/2018   Procedure: PERIPHERAL VASCULAR INTERVENTION;  Surgeon: Waynetta Sandy, MD;  Location: [place] CV LAB;  Service: Cardiovascular;;  SMA Stent   . REPLACEMENT TOTAL KNEE BILATERAL Bilateral 2012  . REVERSE SHOULDER ARTHROPLASTY  02/26/2012   Procedure: REVERSE SHOULDER ARTHROPLASTY;  Surgeon: Marin Shutter, MD;  Location: [place];  Service: Orthopedics;  Laterality: Right;  RIGHT SHOULDER REVERSED ARTHROPLASTY  . SHOULDER ARTHROSCOPY WITH SUBACROMIAL DECOMPRESSION Left 02/10/2013    Procedure: SHOULDER ARTHROSCOPY WITH SUBACROMIAL DECOMPRESSION DISTAL CLAVICLE RESECTION;  Surgeon: Marin Shutter, MD;  Location: [place];  Service: Orthopedics;  Laterality: Left;  DISTAL CLAVICLE RESECTION  . TONSILLECTOMY    . TOTAL HIP ARTHROPLASTY  2007   Right  . TRANSURETHRAL RESECTION OF BLADDER TUMOR N/A 05/31/2018   Procedure: TRANSURETHRAL RESECTION OF BLADDER TUMOR (TURBT);  Surgeon: Lucas Mallow, MD;  Location: [facility];  Service: Urology;  Laterality: N/A;  . VISCERAL ANGIOGRAPHY N/A 03/05/2018   Procedure: VISCERAL ANGIOGRAPHY;  Surgeon: Elam Dutch, MD;  Location: [place] [facility];  Service: Cardiovascular;  Laterality: N/A;  . VISCERAL ANGIOGRAPHY N/A 03/08/2018   Procedure: VISCERAL ANGIOGRAPHY;  Surgeon: Waynetta Sandy, MD;  Location: [place] [facility];  Service: Cardiovascular;  Laterality: N/A;    Current Medications: Prior to Admission medications   Medication  Sig Start Date End Date Taking? Authorizing Provider  atorvastatin (LIPITOR) 20 MG tablet Take 1 tablet (20 mg total) by mouth daily. 05/19/18 10/21/18 Yes Weaver, Scott T, PA-C  Oxycodone HCl 10 MG TABS Take by mouth.   Yes [provider]  allopurinol (ZYLOPRIM) 100 MG tablet Take 100 mg by mouth daily. 01/30/17   [provider]  aspirin EC 81 MG tablet Take 81 mg by mouth daily.    [provider]  citalopram (CELEXA) 20 MG tablet Take 30 mg by mouth at bedtime.     [provider]  CVS D3 1000 units capsule Take 1,000 Units by mouth daily. 01/14/18   [provider]  cyclobenzaprine (FLEXERIL) 10 MG tablet Take 10 mg by mouth 3 (three) times daily.     [provider]  feeding supplement, ENSURE ENLIVE, (ENSURE ENLIVE) LIQD Take 237 mLs by mouth 3 (three) times daily between meals. Patient not taking: Reported on 10/06/2018 03/26/18   Cristal Ford, DO  ferrous sulfate 325 (65 FE) MG tablet Take 325 mg by mouth daily with breakfast.     [provider]  furosemide (LASIX) 40 MG tablet Take 1 tablet (40 mg total) by mouth daily. 10/10/18   Eileen Stanford, PA-C  HYDROcodone-acetaminophen (NORCO) 5-325 MG tablet Take 1 tablet by mouth every 4 (four) hours as needed for moderate pain. Patient not taking: Reported on 10/06/2018 05/31/18   Lucas Mallow, MD  INCRUSE ELLIPTA 62.5 MCG/INH AEPB Inhale 1 puff into the lungs 2 (two) times daily.  11/30/16   [provider]  Ipratropium-Albuterol (COMBIVENT) 20-100 MCG/ACT AERS respimat Inhale 1 puff into the lungs every 6 (six) hours as needed for wheezing.     [provider]  ipratropium-albuterol (DUONEB) 0.5-2.5 (3) MG/3ML SOLN Take 3 mLs by nebulization 4 (four) times daily as needed (for shortness of breath).     [provider]  levothyroxine (SYNTHROID, LEVOTHROID) 25 MCG tablet Take 25 mcg by mouth daily before breakfast.    [provider]  magnesium oxide (MAG-OX) 400 (241.3 Mg) MG tablet Take 1 tablet (400 mg total) by mouth 2 (two) times daily. 03/26/18   Mikhail, Velta Addison, DO  nitroGLYCERIN (NITROSTAT) 0.4 MG SL tablet Place 1 tablet (0.4 mg total) under the tongue every 5 (five) minutes as needed for chest pain. For chest pain 04/24/16   Fay Records, MD  omeprazole (PRILOSEC) 40 MG capsule Take 40 mg by mouth at bedtime.     [provider]  Oxycodone HCl 10 MG TABS Take 1 tablet (10 mg total) by mouth every 6 (six) hours as needed. Patient taking differently: Take 10 mg by mouth every 6 (six) hours as needed (for pain).  04/05/18   Nita Sells, MD  pilocarpine (PILOCAR) 1 % ophthalmic solution Place 1 drop into both eyes 2 (two) times daily.    [provider]  potassium chloride (K-DUR,KLOR-CON) 10 MEQ tablet Take 2 tablets (20 mEq total) by mouth daily. 10/11/18   Eileen Stanford, PA-C  pramipexole (MIRAPEX) 0.25 MG tablet Take 0.25 mg by mouth at bedtime. 01/14/18   [provider]    roflumilast (DALIRESP) 500 MCG TABS tablet Take 500 mcg by mouth daily.     [provider]  spironolactone (ALDACTONE) 25 MG tablet Take 1 tablet (25 mg total) by mouth daily. 09/29/18 09/29/19  Kroeger, Lorelee Cover., PA-C    Allergies:   Ativan [lorazepam]; Pitavastatin; Ropinirole; Zofran [ondansetron hcl]; Zolpidem  tartrate; and Penicillins   Social History   Socioeconomic History  . Marital status: Widowed    Spouse name: Not on file  . Number of children: 3  . Years of education: Not on file  . Highest education level: Not on file  Occupational History  . Occupation: retired    Comment: Electronics engineer: RETIRED  Social Needs  . Financial resource strain: Not on file  . Food insecurity:    Worry: Not on file    Inability: Not on file  . Transportation needs:    Medical: Not on file    Non-medical: Not on file  Tobacco Use  . Smoking status: Former Smoker    Packs/day: 1.00    Years: 45.00    Pack years: 45.00    Types: Cigarettes    Last attempt to quit: 12/22/2000    Years since quitting: 17.8  . Smokeless tobacco: Never Used  Substance and Sexual Activity  . Alcohol use: Never    Alcohol/week: 0.0 standard drinks    Frequency: Never  . Drug use: Never  . Sexual activity: Not Currently  Lifestyle  . Physical activity:    Days per week: Not on file    Minutes per session: Not on file  . Stress: Not on file  Relationships  . Social connections:    Talks on phone: Not on file    Gets together: Not on file    Attends religious service: Not on file    Active member of club or organization: Not on file    Attends meetings of clubs or organizations: Not on file    Relationship status: Not on file  Other Topics Concern  . Not on file  Social History Narrative      3 grown children.     Family History:  The patient's family history includes COPD in her father; Cancer in her mother, sister, and unknown relative; Colitis in her unknown relative;  Heart disease in her father, sister, and unknown relative; Lung cancer in her father; Ovarian cancer in her mother.   ROS:   Please see the history of present illness.    ROS All other systems reviewed and are negative.   PHYSICAL EXAM:   VS:  BP (!) 108/50   Pulse 60   Ht 5\' 8"  (1.727 m)   Wt 158 lb 6.4 oz (71.8 kg)   SpO2 97%   BMI 24.08 kg/m    GEN: Well nourished, well developed, in no acute distress  HEENT: normal  Neck: no JVD, carotid bruits, or masses Cardiac: RRR; no murmurs, rubs, or gallops, 1 + BL LE  edema  Respiratory:  clear to auscultation bilaterally, normal work of breathing GI: soft, nontender, nondistended, + BS MS: no deformity or atrophy  Skin: warm and dry, no rash Neuro:  Alert and Oriented x 3, Strength and sensation are intact Psych: euthymic mood, full affect  Wt Readings from Last 3 Encounters:  10/21/18 158 lb 6.4 oz (71.8 kg)  10/10/18 153 lb 14.4 oz (69.8 kg)  10/05/18 161 lb (73 kg)      Studies/Labs Reviewed:   EKG:  EKG is not ordered today.    Recent Labs: 10/05/2018: NT-Pro BNP 1,048 10/06/2018: ALT 8; B Natriuretic Peptide 124.2 10/10/2018: Hemoglobin 11.3; Platelets 258 10/19/2018: BUN 20; Creatinine, Ser 1.34; Magnesium 2.1; Potassium 4.4; Sodium 143   Lipid Panel    Component Value Date/Time   CHOL 106 07/01/2018 0800  CHOL 98 05/23/2014 0857   TRIG 88 07/01/2018 0800   TRIG 83 05/23/2014 0857   HDL 38 (L) 07/01/2018 0800   HDL 35 (L) 05/23/2014 0857   CHOLHDL 2.8 07/01/2018 0800   CHOLHDL 4 03/03/2013 1136   VLDL 24.4 03/03/2013 1136   LDLCALC 50 07/01/2018 0800   LDLCALC 46 05/23/2014 0857    Additional studies/ records that were reviewed today include:   VAS [place] MESENTERIC 06/2018 FINAL INTERPRETATION: Largest Aortic Diameter: 2.7 cm  Mesenteric: Normal Celiac artery , Splenic artery, Hepatic artery and Inferior Mesenteric artery findings. 70 to 99% stenosis in the superior mesenteric artery.  Carotid  doppler 05/2018 Final Interpretation: Right Carotid: Velocities in the right ICA are now consistent with a 1-39%        stenosis, high end range. Non-hemodynamically significant plaque        <50% noted in the CCA. The RICA velocities are elevated and have        decreased compared to the prior exam.  Left Carotid: Velocities in the left ICA are now consistent with a 40-59%       stenosis. Non-hemodynamically significant plaque noted in the CCA.       The ECA appears >50% stenosed. The LICA velocities are elevated       and have decreased compared to the prior exam.  Vertebrals: Bilateral vertebral arteries demonstrate antegrade flow. Subclavians: Bilateral subclavian arteries were stenotic. Right subclavian       artery flow was disturbed.  Echocardiogram: 03/2018 Study Conclusions  - Left ventricle: The cavity size was normal. Wall thickness was normal. Systolic function was normal. The estimated ejection fraction was in the range of 55% to 60%. Wall motion was normal; there were no regional wall motion abnormalities. The study is not technically sufficient to allow evaluation of LV diastolic function. - Mitral valve: Calcified annulus  Stress test 08/31/17  Nuclear stress EF: 65%.  Small region of distal anterior and apical ischemia, cannot exclude shifting breast attenuation. . Small anteroseptal defect consistent with probable soft tissue attenuation  This is a low risk study.   ASSESSMENT & PLAN:    1. Chronic diastolic CHF - Discharge weight was 153lb. Weight today 158lb. Gained 3 pounds based on home scale.  This is due to excess salt intake as she drinks 2 glasses of canned tomato juice every day.  Advised complete cessation.  Monitor weights and symptoms.  Take extra Lasix today and as needed afterwards.  No change in therapy otherwise.  Labs reassuring yesterday.  2. CAD s.p CABG and PCI - No  angina. Continue ASA and statin.   3. Hx of DVT/PE -  not on anticoagulation due to hx of GI bleeding. S/p IVC filter  4.  Mesenteric ischemia -Followed by vascular  Medication Adjustments/Labs and Tests Ordered: Current medicines are reviewed at length with the patient today.  Concerns regarding medicines are outlined above.  Medication changes, Labs and Tests ordered today are listed in the Patient Instructions below. Patient Instructions  Medication Instructions:  Your physician recommends that you continue on your current medications as directed. Please refer to the Current Medication list given to you today.  *TAKE AN EXTRA LASIX TODAY 10/21/18   If you need a refill on your cardiac medications before your next appointment, please call your pharmacy.   Lab work: NONE  If you have labs (blood work) drawn today and your tests are completely normal, you will receive your results only by: [facility] MyChart  Message (if you have MyChart) OR . A paper copy in the mail If you have any lab test that is abnormal or we need to change your treatment, we will call you to review the results.  Testing/Procedures: NONE  Follow-Up: At [facility], you and your health needs are our priority.  As part of our continuing mission to provide you with exceptional heart care, we have created designated Provider Care Teams.  These Care Teams include your primary Cardiologist (physician) and Advanced Practice Providers (APPs -  Physician Assistants and Nurse Practitioners) who all work together to provide you with the care you need, when you need it. You will need a follow up appointment in:  2 months.  You may see Dorris Carnes, MD or one of the following Advanced Practice Providers on your designated Care Team: [name], PA-C [place], [place] . Daune Perch, NP  Any Other Special Instructions Will Be Listed Below (If Applicable).   DASH Eating Plan DASH stands for "Dietary Approaches to Stop  Hypertension." The DASH eating plan is a healthy eating plan that has been shown to reduce high blood pressure (hypertension). It may also reduce your risk for type 2 diabetes, heart disease, and stroke. The DASH eating plan may also help with weight loss. What are tips for following this plan? General guidelines  Avoid eating more than 2,300 mg (milligrams) of salt (sodium) a day. If you have hypertension, you may need to reduce your sodium intake to 1,500 mg a day.  Limit alcohol intake to no more than 1 drink a day for nonpregnant women and 2 drinks a day for men. One drink equals 12 oz of beer, 5 oz of wine, or 1 oz of hard liquor.  Work with your health care provider to maintain a healthy body weight or to lose weight. Ask what an ideal weight is for you.  Get at least 30 minutes of exercise that causes your heart to beat faster (aerobic exercise) most days of the week. Activities may include walking, swimming, or biking.  Work with your health care provider or diet and nutrition specialist (dietitian) to adjust your eating plan to your individual calorie needs. Reading food labels  Check food labels for the amount of sodium per serving. Choose foods with less than 5 percent of the Daily Value of sodium. Generally, foods with less than 300 mg of sodium per serving fit into this eating plan.  To find whole grains, look for the word "whole" as the first word in the ingredient list. Shopping  Buy products labeled as "low-sodium" or "no salt added."  Buy fresh foods. Avoid canned foods and premade or frozen meals. Cooking  Avoid adding salt when cooking. Use salt-free seasonings or herbs instead of table salt or sea salt. Check with your health care provider or pharmacist before using salt substitutes.  Do not fry foods. Cook foods using healthy methods such as baking, boiling, grilling, and broiling instead.  Cook with heart-healthy oils, such as olive, canola, soybean, or sunflower  oil. Meal planning   Eat a balanced diet that includes: ? 5 or more servings of fruits and vegetables each day. At each meal, try to fill half of your plate with fruits and vegetables. ? Up to 6-8 servings of whole grains each day. ? Less than 6 oz of lean meat, poultry, or fish each day. A 3-oz serving of meat is about the same size as a deck of cards. One egg equals 1 oz. ?  2 servings of low-fat dairy each day. ? A serving of nuts, seeds, or beans 5 times each week. ? Heart-healthy fats. Healthy fats called Omega-3 fatty acids are found in foods such as flaxseeds and coldwater fish, like sardines, salmon, and mackerel.  Limit how much you eat of the following: ? Canned or prepackaged foods. ? Food that is high in trans fat, such as fried foods. ? Food that is high in saturated fat, such as fatty meat. ? Sweets, desserts, sugary drinks, and other foods with added sugar. ? Full-fat dairy products.  Do not salt foods before eating.  Try to eat at least 2 vegetarian meals each week.  Eat more home-cooked food and less restaurant, buffet, and fast food.  When eating at a restaurant, ask that your food be prepared with less salt or no salt, if possible. What foods are recommended? The items listed may not be a complete list. Talk with your dietitian about what dietary choices are best for you. Grains Whole-grain or whole-wheat bread. Whole-grain or whole-wheat pasta. Brown rice. [place]ena [place]row. Bulgur. Whole-grain and low-sodium cereals. Pita bread. Low-fat, low-sodium crackers. Whole-wheat flour tortillas. Vegetables Fresh or frozen vegetables (raw, steamed, roasted, or grilled). Low-sodium or reduced-sodium tomato and vegetable juice. Low-sodium or reduced-sodium tomato sauce and tomato paste. Low-sodium or reduced-sodium canned vegetables. Fruits All fresh, dried, or frozen fruit. Canned fruit in natural juice (without added sugar). Meat and other protein foods Skinless chicken or  [place]. Ground chicken or [place]. Pork with fat trimmed off. Fish and seafood. Egg whites. Dried beans, peas, or lentils. Unsalted nuts, nut butters, and seeds. Unsalted canned beans. Lean cuts of beef with fat trimmed off. Low-sodium, lean deli meat. Dairy Low-fat (1%) or fat-free (skim) milk. Fat-free, low-fat, or reduced-fat cheeses. Nonfat, low-sodium ricotta or cottage cheese. Low-fat or nonfat yogurt. Low-fat, low-sodium cheese. Fats and oils Soft margarine without trans fats. Vegetable oil. Low-fat, reduced-fat, or light mayonnaise and salad dressings (reduced-sodium). Canola, safflower, olive, soybean, and sunflower oils. Avocado. Seasoning and other foods Herbs. Spices. Seasoning mixes without salt. Unsalted popcorn and pretzels. Fat-free sweets. What foods are not recommended? The items listed may not be a complete list. Talk with your dietitian about what dietary choices are best for you. Grains Baked goods made with fat, such as croissants, muffins, or some breads. Dry pasta or rice meal packs. Vegetables Creamed or fried vegetables. Vegetables in a cheese sauce. Regular canned vegetables (not low-sodium or reduced-sodium). Regular canned tomato sauce and paste (not low-sodium or reduced-sodium). Regular tomato and vegetable juice (not low-sodium or reduced-sodium). Angie Fava. Olives. Fruits Canned fruit in a light or heavy syrup. Fried fruit. Fruit in cream or butter sauce. Meat and other protein foods Fatty cuts of meat. Ribs. Fried meat. Berniece Salines. Sausage. Bologna and other processed lunch meats. Salami. Fatback. Hotdogs. Bratwurst. Salted nuts and seeds. Canned beans with added salt. Canned or smoked fish. Whole eggs or egg yolks. Chicken or [place] with skin. Dairy Whole or 2% milk, cream, and half-and-half. Whole or full-fat cream cheese. Whole-fat or sweetened yogurt. Full-fat cheese. Nondairy creamers. Whipped toppings. Processed cheese and cheese spreads. Fats and oils Butter.  Stick margarine. Lard. Shortening. Ghee. Bacon fat. Tropical oils, such as coconut, palm kernel, or palm oil. Seasoning and other foods Salted popcorn and pretzels. Onion salt, garlic salt, seasoned salt, table salt, and sea salt. Worcestershire sauce. Tartar sauce. Barbecue sauce. Teriyaki sauce. Soy sauce, including reduced-sodium. Steak sauce. Canned and packaged gravies. Fish sauce. Oyster sauce. Cocktail sauce.  Horseradish that you find on the shelf. Ketchup. Mustard. Meat flavorings and tenderizers. Bouillon cubes. Hot sauce and Tabasco sauce. Premade or packaged marinades. Premade or packaged taco seasonings. Relishes. Regular salad dressings. Where to find more information:  National Heart, Lung, and [place]: https://wilson-eaton.com/  American Heart Association: www.heart.org Summary  The DASH eating plan is a healthy eating plan that has been shown to reduce high blood pressure (hypertension). It may also reduce your risk for type 2 diabetes, heart disease, and stroke.  With the DASH eating plan, you should limit salt (sodium) intake to 2,300 mg a day. If you have hypertension, you may need to reduce your sodium intake to 1,500 mg a day.  When on the DASH eating plan, aim to eat more fresh fruits and vegetables, whole grains, lean proteins, low-fat dairy, and heart-healthy fats.  Work with your health care provider or diet and nutrition specialist (dietitian) to adjust your eating plan to your individual calorie needs. This information is not intended to replace advice given to you by your health care provider. Make sure you discuss any questions you have with your health care provider. Document Released: 11/27/2011 Document Revised: 12/01/2016 Document Reviewed: 12/01/2016 Elsevier Interactive Patient Education  2018 [place], [place], [place]  10/21/2018 8:33 AM    [place], [place], [place]  [address] Phone:  [phone]; Fax: [phone]

## 2018-10-21 NOTE — Patient Instructions (Signed)
Medication Instructions:  Your physician recommends that you continue on your current medications as directed. Please refer to the Current Medication list given to you today.  *TAKE AN EXTRA LASIX TODAY 10/21/18   If you need a refill on your cardiac medications before your next appointment, please call your pharmacy.   Lab work: NONE  If you have labs (blood work) drawn today and your tests are completely normal, you will receive your results only by: Marland Kitchen MyChart Message (if you have MyChart) OR . A paper copy in the mail If you have any lab test that is abnormal or we need to change your treatment, we will call you to review the results.  Testing/Procedures: NONE  Follow-Up: At [facility], you and your health needs are our priority.  As part of our continuing mission to provide you with exceptional heart care, we have created designated Provider Care Teams.  These Care Teams include your primary Cardiologist (physician) and Advanced Practice Providers (APPs -  Physician Assistants and Nurse Practitioners) who all work together to provide you with the care you need, when you need it. You will need a follow up appointment in:  2 months.  You may see Dorris Carnes, MD or one of the following Advanced Practice Providers on your designated Care Team: [name], PA-C [place], [place] . Daune Perch, NP  Any Other Special Instructions Will Be Listed Below (If Applicable).   DASH Eating Plan DASH stands for "Dietary Approaches to Stop Hypertension." The DASH eating plan is a healthy eating plan that has been shown to reduce high blood pressure (hypertension). It may also reduce your risk for type 2 diabetes, heart disease, and stroke. The DASH eating plan may also help with weight loss. What are tips for following this plan? General guidelines  Avoid eating more than 2,300 mg (milligrams) of salt (sodium) a day. If you have hypertension, you may need to reduce your sodium intake to  1,500 mg a day.  Limit alcohol intake to no more than 1 drink a day for nonpregnant women and 2 drinks a day for men. One drink equals 12 oz of beer, 5 oz of wine, or 1 oz of hard liquor.  Work with your health care provider to maintain a healthy body weight or to lose weight. Ask what an ideal weight is for you.  Get at least 30 minutes of exercise that causes your heart to beat faster (aerobic exercise) most days of the week. Activities may include walking, swimming, or biking.  Work with your health care provider or diet and nutrition specialist (dietitian) to adjust your eating plan to your individual calorie needs. Reading food labels  Check food labels for the amount of sodium per serving. Choose foods with less than 5 percent of the Daily Value of sodium. Generally, foods with less than 300 mg of sodium per serving fit into this eating plan.  To find whole grains, look for the word "whole" as the first word in the ingredient list. Shopping  Buy products labeled as "low-sodium" or "no salt added."  Buy fresh foods. Avoid canned foods and premade or frozen meals. Cooking  Avoid adding salt when cooking. Use salt-free seasonings or herbs instead of table salt or sea salt. Check with your health care provider or pharmacist before using salt substitutes.  Do not fry foods. Cook foods using healthy methods such as baking, boiling, grilling, and broiling instead.  Cook with heart-healthy oils, such as olive, canola, soybean, or  sunflower oil. Meal planning   Eat a balanced diet that includes: ? 5 or more servings of fruits and vegetables each day. At each meal, try to fill half of your plate with fruits and vegetables. ? Up to 6-8 servings of whole grains each day. ? Less than 6 oz of lean meat, poultry, or fish each day. A 3-oz serving of meat is about the same size as a deck of cards. One egg equals 1 oz. ? 2 servings of low-fat dairy each day. ? A serving of nuts, seeds, or  beans 5 times each week. ? Heart-healthy fats. Healthy fats called Omega-3 fatty acids are found in foods such as flaxseeds and coldwater fish, like sardines, salmon, and mackerel.  Limit how much you eat of the following: ? Canned or prepackaged foods. ? Food that is high in trans fat, such as fried foods. ? Food that is high in saturated fat, such as fatty meat. ? Sweets, desserts, sugary drinks, and other foods with added sugar. ? Full-fat dairy products.  Do not salt foods before eating.  Try to eat at least 2 vegetarian meals each week.  Eat more home-cooked food and less restaurant, buffet, and fast food.  When eating at a restaurant, ask that your food be prepared with less salt or no salt, if possible. What foods are recommended? The items listed may not be a complete list. Talk with your dietitian about what dietary choices are best for you. Grains Whole-grain or whole-wheat bread. Whole-grain or whole-wheat pasta. Brown rice. [place]. Bulgur. Whole-grain and low-sodium cereals. Pita bread. Low-fat, low-sodium crackers. Whole-wheat flour tortillas. Vegetables Fresh or frozen vegetables (raw, steamed, roasted, or grilled). Low-sodium or reduced-sodium tomato and vegetable juice. Low-sodium or reduced-sodium tomato sauce and tomato paste. Low-sodium or reduced-sodium canned vegetables. Fruits All fresh, dried, or frozen fruit. Canned fruit in natural juice (without added sugar). Meat and other protein foods Skinless chicken or [place]. Ground chicken or [place]. Pork with fat trimmed off. Fish and seafood. Egg whites. Dried beans, peas, or lentils. Unsalted nuts, nut butters, and seeds. Unsalted canned beans. Lean cuts of beef with fat trimmed off. Low-sodium, lean deli meat. Dairy Low-fat (1%) or fat-free (skim) milk. Fat-free, low-fat, or reduced-fat cheeses. Nonfat, low-sodium ricotta or cottage cheese. Low-fat or nonfat yogurt. Low-fat, low-sodium cheese. Fats and  oils Soft margarine without trans fats. Vegetable oil. Low-fat, reduced-fat, or light mayonnaise and salad dressings (reduced-sodium). Canola, safflower, olive, soybean, and sunflower oils. Avocado. Seasoning and other foods Herbs. Spices. Seasoning mixes without salt. Unsalted popcorn and pretzels. Fat-free sweets. What foods are not recommended? The items listed may not be a complete list. Talk with your dietitian about what dietary choices are best for you. Grains Baked goods made with fat, such as croissants, muffins, or some breads. Dry pasta or rice meal packs. Vegetables Creamed or fried vegetables. Vegetables in a cheese sauce. Regular canned vegetables (not low-sodium or reduced-sodium). Regular canned tomato sauce and paste (not low-sodium or reduced-sodium). Regular tomato and vegetable juice (not low-sodium or reduced-sodium). Angie Fava. Olives. Fruits Canned fruit in a light or heavy syrup. Fried fruit. Fruit in cream or butter sauce. Meat and other protein foods Fatty cuts of meat. Ribs. Fried meat. Berniece Salines. Sausage. Bologna and other processed lunch meats. Salami. Fatback. Hotdogs. Bratwurst. Salted nuts and seeds. Canned beans with added salt. Canned or smoked fish. Whole eggs or egg yolks. Chicken or [place] with skin. Dairy Whole or 2% milk, cream, and half-and-half. Whole or full-fat  cream cheese. Whole-fat or sweetened yogurt. Full-fat cheese. Nondairy creamers. Whipped toppings. Processed cheese and cheese spreads. Fats and oils Butter. Stick margarine. Lard. Shortening. Ghee. Bacon fat. Tropical oils, such as coconut, palm kernel, or palm oil. Seasoning and other foods Salted popcorn and pretzels. Onion salt, garlic salt, seasoned salt, table salt, and sea salt. Worcestershire sauce. Tartar sauce. Barbecue sauce. Teriyaki sauce. Soy sauce, including reduced-sodium. Steak sauce. Canned and packaged gravies. Fish sauce. Oyster sauce. Cocktail sauce. Horseradish that you find on the  shelf. Ketchup. Mustard. Meat flavorings and tenderizers. Bouillon cubes. Hot sauce and Tabasco sauce. Premade or packaged marinades. Premade or packaged taco seasonings. Relishes. Regular salad dressings. Where to find more information:  National Heart, Lung, and [place]: https://wilson-eaton.com/  American Heart Association: www.heart.org Summary  The DASH eating plan is a healthy eating plan that has been shown to reduce high blood pressure (hypertension). It may also reduce your risk for type 2 diabetes, heart disease, and stroke.  With the DASH eating plan, you should limit salt (sodium) intake to 2,300 mg a day. If you have hypertension, you may need to reduce your sodium intake to 1,500 mg a day.  When on the DASH eating plan, aim to eat more fresh fruits and vegetables, whole grains, lean proteins, low-fat dairy, and heart-healthy fats.  Work with your health care provider or diet and nutrition specialist (dietitian) to adjust your eating plan to your individual calorie needs. This information is not intended to replace advice given to you by your health care provider. Make sure you discuss any questions you have with your health care provider. Document Released: 11/27/2011 Document Revised: 12/01/2016 Document Reviewed: 12/01/2016 Elsevier Interactive Patient Education  [employer].

## 2018-10-25 DIAGNOSIS — J449 Chronic obstructive pulmonary disease, unspecified: Secondary | ICD-10-CM | POA: Diagnosis not present

## 2018-11-01 ENCOUNTER — Ambulatory Visit: Payer: Medicare HMO | Admitting: Internal Medicine

## 2018-11-01 ENCOUNTER — Other Ambulatory Visit: Payer: Self-pay | Admitting: Internal Medicine

## 2018-11-01 ENCOUNTER — Encounter: Payer: Self-pay | Admitting: Internal Medicine

## 2018-11-01 VITALS — BP 130/62 | HR 85 | Ht 68.0 in | Wt 160.6 lb

## 2018-11-01 DIAGNOSIS — I5032 Chronic diastolic (congestive) heart failure: Secondary | ICD-10-CM

## 2018-11-01 DIAGNOSIS — I2510 Atherosclerotic heart disease of native coronary artery without angina pectoris: Secondary | ICD-10-CM | POA: Diagnosis not present

## 2018-11-01 DIAGNOSIS — Z86711 Personal history of pulmonary embolism: Secondary | ICD-10-CM

## 2018-11-01 NOTE — Progress Notes (Signed)
21   Cardiology Office Note   Date:  11/01/2018   ID:  [name], DOB [birth year], MRN [id number]  PCP:  [name], MD  Cardiologist:   Dorris Carnes, MD    F/U of diastolic CHF      History of Present Illness: [name] is a 77 y.o. female with a history of CAD(s/p CABG in 2004 and DES to LM), PAD, chronic diastlic CHF, recurrent DVTs, PE (last in March2019)(s/p IVC filter, GI bleeding, HTN, HL and cirrhosis.    The patient was hospitalized on 10/06/18 until 12/20 complaining of dyspnea, edema   Also K was low   Aldactone was added and KDur The pt was seen by B Bhagat on Oct 31.   Recomm that she cut back on tomato juce     She continues to have fatigue   Also LE edema      Current Meds  Medication Sig  . allopurinol (ZYLOPRIM) 100 MG tablet Take 100 mg by mouth daily.  Marland Kitchen aspirin EC 81 MG tablet Take 81 mg by mouth daily.  Marland Kitchen atorvastatin (LIPITOR) 20 MG tablet Take 1 tablet (20 mg total) by mouth daily.  . citalopram (CELEXA) 20 MG tablet Take 30 mg by mouth at bedtime.   . [facility] D3 1000 units capsule Take 1,000 Units by mouth daily.  . cyclobenzaprine (FLEXERIL) 10 MG tablet Take 10 mg by mouth 3 (three) times daily.   . ferrous sulfate 325 (65 FE) MG tablet Take 325 mg by mouth daily with breakfast.  . furosemide (LASIX) 40 MG tablet Take 1 tablet (40 mg total) by mouth daily.  Marland Kitchen HYDROcodone-acetaminophen (NORCO) 5-325 MG tablet Take 1 tablet by mouth every 4 (four) hours as needed for moderate pain.  . INCRUSE ELLIPTA 62.5 MCG/INH AEPB Inhale 1 puff into the lungs 2 (two) times daily.   . Ipratropium-Albuterol (COMBIVENT) 20-100 MCG/ACT AERS respimat Inhale 1 puff into the lungs every 6 (six) hours as needed for wheezing.   Marland Kitchen levothyroxine (SYNTHROID, LEVOTHROID) 25 MCG tablet Take 25 mcg by mouth daily before breakfast.  . magnesium oxide (MAG-OX) 400 (241.3 Mg) MG tablet Take 1 tablet (400 mg total) by mouth 2 (two) times daily.  . nitroGLYCERIN (NITROSTAT) 0.4 MG  SL tablet Place 1 tablet (0.4 mg total) under the tongue every 5 (five) minutes as needed for chest pain. For chest pain  . omeprazole (PRILOSEC) 40 MG capsule Take 40 mg by mouth at bedtime.   . Oxycodone HCl 10 MG TABS Take 10 mg by mouth as needed (FOR PAIN).   Marland Kitchen pilocarpine (PILOCAR) 1 % ophthalmic solution Place 1 drop into both eyes 2 (two) times daily.  . potassium chloride (K-DUR,KLOR-CON) 10 MEQ tablet Take 2 tablets (20 mEq total) by mouth daily.  . pramipexole (MIRAPEX) 0.25 MG tablet Take 0.25 mg by mouth at bedtime.  . roflumilast (DALIRESP) 500 MCG TABS tablet Take 500 mcg by mouth daily.   Marland Kitchen spironolactone (ALDACTONE) 25 MG tablet Take 1 tablet (25 mg total) by mouth daily.  . traZODone (DESYREL) 50 MG tablet Take 50 mg by mouth at bedtime.      Allergies:   Ativan [lorazepam]; Pitavastatin; Ropinirole; Zofran [ondansetron hcl]; Zolpidem tartrate; and Penicillins   Past Medical History:  Diagnosis Date  . Anemia    bld. transfusion post lumbar surgery- 2012  . Anxiety   . Arthralgia    NOS  . Blood transfusion 2012; 02/2018   WITH BACK SURGERY; "related  to hematoma in stomach" (10/06/2018)  . CAD (coronary artery disease)    s/p CABG 2004; s/p DES to LM in 2010;  [place] 10/29/11: EF 50-55%, mild elevated filling pressures, no pulmonary HTN, LM 90% ISR, LAD and CFX occluded, S-RI occluded (old), S-OM3 ok and L-LAD ok, native nondominant RCA 95% -  med rx recommended ; Lexiscan Myoview 7/13 at [facility]: demonstrated "normal LV function, anterior attenuation and localized ischemia, inferior, basilar, mid section"  . Carotid artery disease ([place])    Carotid US 4/09:  RICA 8-11; LICA 91-47; R subclavian stenosis - Repeat 1 year.  . CHF (congestive heart failure) ([place])   . Chronic diastolic heart failure (HCC)    Echo 9/10: EF 82-95%, grade 1 diastolic dysfunction  . Chronic lower back pain   . COPD (chronic obstructive pulmonary disease) ([place])    Emphysema dxed by Dr. Woody Seller in  [place] based on PFTs per pt in 2006; placed on albuterol  . Depression    TAKES CELEXA  AND  (OFF- WELBUTRIN)  . Diuretic-induced hypokalemia 10/08/2018  . DVT of lower extremity (deep venous thrombosis) (HCC)    recurrent. bilateral (2 episodes)  . Dyspnea   . Dysrhythmia    afib with cabg  . Exertional angina (HCC)    Treated with Isosorbide, Ranexa, amlodipine; intolerant to metoprolol  . GERD (gastroesophageal reflux disease)   . Gout    "on daily RX" (10/06/2018)  . HLD (hyperlipidemia)   . Hypertension   . Hyperthyroidism   . Obesity (BMI 30-39.9) 2009   BMI 33  . Osteoarthritis    "all over; hands" (10/06/2018)  . Oxygen deficiency   . Oxygen dependent    4L at home as needed (10/06/2018)  . Pneumonia   . PVD (peripheral vascular disease) ([place])   . Sleep apnea 2012   USED CPAP THEN  STARTED USING SPIRIVA , Nov. 2013- last evaluation , changed from mask to aparatus that is just for her nose.. ; reports 05-28-18 "the mask smothers me so i dont use it right now" (10/06/2018)    Past Surgical History:  Procedure Laterality Date  . ANKLE SURGERY  2004  . ANTERIOR CERVICAL DECOMP/DISCECTOMY FUSION  11/2007  . AUGMENTATION MAMMAPLASTY    . [place]   lower; another scheduled, opt. reports 4 back- lumbar, 3 cerv. fusions  for later 2009  . CARDIAC CATHETERIZATION  11/2009   Patent LIMA to LAD and patent SVG to OM1. Occluded SVG to ramus and diagonal. Left main: 90% ostial stenosis, LCX 60-70% proximal stenosis  . CATARACT EXTRACTION W/ INTRAOCULAR LENS  IMPLANT, BILATERAL Bilateral 2006  . COLONOSCOPY WITH PROPOFOL N/A 02/17/2017   Procedure: COLONOSCOPY WITH PROPOFOL;  Surgeon: Jerene Bears, MD;  Location: [facility] ENDOSCOPY;  Service: Endoscopy;  Laterality: N/A;  . CORONARY ANGIOPLASTY WITH STENT PLACEMENT  11/2009   Drug eluting stent to left main artery: 4.0 X 12 mm Ion   . CORONARY ARTERY BYPASS GRAFT  2004   "CABG X4"  . ESOPHAGOGASTRODUODENOSCOPY N/A 02/14/2017     Procedure: ESOPHAGOGASTRODUODENOSCOPY (EGD);  Surgeon: Doran Stabler, MD;  Location: [facility] ENDOSCOPY;  Service: Endoscopy;  Laterality: N/A;  . GIVENS CAPSULE STUDY N/A 02/15/2017   Procedure: GIVENS CAPSULE STUDY;  Surgeon: Doran Stabler, MD;  Location: [place];  Service: Endoscopy;  Laterality: N/A;  . GROIN MASS OPEN BIOPSY  2004  . IR IVC FILTER PLMT / S&I /IMG GUID/MOD SED  03/14/2018  . IR  PARACENTESIS  03/10/2018  . JOINT REPLACEMENT    . LAPAROSCOPIC CHOLECYSTECTOMY    . PERIPHERAL VASCULAR INTERVENTION Left 03/05/2018   Procedure: PERIPHERAL VASCULAR INTERVENTION;  Surgeon: Elam Dutch, MD;  Location: [place] CV LAB;  Service: Cardiovascular;  Laterality: Left;  Attempted unsuccess\ful Per Dr. Eden Lathe  . PERIPHERAL VASCULAR INTERVENTION  03/08/2018   Procedure: PERIPHERAL VASCULAR INTERVENTION;  Surgeon: Waynetta Sandy, MD;  Location: [place] CV LAB;  Service: Cardiovascular;;  SMA Stent   . REPLACEMENT TOTAL KNEE BILATERAL Bilateral 2012  . REVERSE SHOULDER ARTHROPLASTY  02/26/2012   Procedure: REVERSE SHOULDER ARTHROPLASTY;  Surgeon: Marin Shutter, MD;  Location: [facility];  Service: Orthopedics;  Laterality: Right;  RIGHT SHOULDER REVERSED ARTHROPLASTY  . SHOULDER ARTHROSCOPY WITH SUBACROMIAL DECOMPRESSION Left 02/10/2013   Procedure: SHOULDER ARTHROSCOPY WITH SUBACROMIAL DECOMPRESSION DISTAL CLAVICLE RESECTION;  Surgeon: Marin Shutter, MD;  Location: [place];  Service: Orthopedics;  Laterality: Left;  DISTAL CLAVICLE RESECTION  . TONSILLECTOMY    . TOTAL HIP ARTHROPLASTY  2007   Right  . TRANSURETHRAL RESECTION OF BLADDER TUMOR N/A 05/31/2018   Procedure: TRANSURETHRAL RESECTION OF BLADDER TUMOR (TURBT);  Surgeon: Lucas Mallow, MD;  Location: [facility];  Service: Urology;  Laterality: N/A;  . VISCERAL ANGIOGRAPHY N/A 03/05/2018   Procedure: VISCERAL ANGIOGRAPHY;  Surgeon: Elam Dutch, MD;  Location: [place] CV LAB;  Service: Cardiovascular;   Laterality: N/A;  . VISCERAL ANGIOGRAPHY N/A 03/08/2018   Procedure: VISCERAL ANGIOGRAPHY;  Surgeon: Waynetta Sandy, MD;  Location: [place] CV [facility];  Service: Cardiovascular;  Laterality: N/A;     Social History:  The patient  reports that she quit smoking about 17 years ago. Her smoking use included cigarettes. She has a 45.00 pack-year smoking history. She has never used smokeless tobacco. She reports that she does not drink alcohol or use drugs.   Family History:  The patient's family history includes COPD in her father; Cancer in her mother, sister, and unknown relative; Colitis in her unknown relative; Heart disease in her father, sister, and unknown relative; Lung cancer in her father; Ovarian cancer in her mother.    ROS:  Please see the history of present illness. All other systems are reviewed and  Negative to the above problem except as noted.    PHYSICAL EXAM: VS:  BP 130/62   Pulse 85   Ht 5\' 8"  (1.727 m)   Wt 160 lb 9.6 oz (72.8 kg)   SpO2 96%   BMI 24.42 kg/m   GEN: Well nourished, well developed, in no acute distress  HEENT: normal  Neck: no JVD, carotid bruits, or masses Cardiac: RRR; no murmurs, rubs, or gallops,1-2+ edema  Respiratory:  clear to auscultation bilaterally, normal work of breathing GI: soft, nontender, nondistended, + BS  No hepatomegaly  MS: no deformity Moving all extremities   Skin: warm and dry, no rash Neuro:  Strength and sensation are intact Psych: euthymic mood, full affect   EKG:  EKG is ordered today.   Lipid Panel    Component Value Date/Time   CHOL 106 07/01/2018 0800   CHOL 98 05/23/2014 0857   TRIG 88 07/01/2018 0800   TRIG 83 05/23/2014 0857   HDL 38 (L) 07/01/2018 0800   HDL 35 (L) 05/23/2014 0857   CHOLHDL 2.8 07/01/2018 0800   CHOLHDL 4 03/03/2013 1136   VLDL 24.4 03/03/2013 1136   LDLCALC 50 07/01/2018 0800   LDLCALC 46 05/23/2014 0857  Wt Readings from Last 3 Encounters:  11/01/18 160 lb 9.6 oz  (72.8 kg)  10/21/18 158 lb 6.4 oz (71.8 kg)  10/10/18 153 lb 14.4 oz (69.8 kg)      ASSESSMENT AND PLAN:  1  Edema   With hidtory of PE and DVT will get lower extrem venous dopplers    Would also set up for echo to evaluate RV function (with history of PE  Will check BMET and TSH     Keep fluids at 1800 cc max    Low Na    2   Acute on chronic diastolic CHF   As above  3  Hypokalemia  Check BMET  4   HTN   Follow    5   CAD   S/p CABG    F/U in clinic in 6 wks    Current medicines are reviewed at length with the patient today.  The patient does not have concerns regarding medicines.  Signed, Dorris Carnes, MD  11/01/2018 10:38 AM    [address], [place], [place]  [address] Phone: [phone]; Fax: [phone]

## 2018-11-01 NOTE — Patient Instructions (Addendum)
Medication Instructions:  No changes If you need a refill on your cardiac medications before your next appointment, please call your pharmacy.   Lab work: Today: bmet, bnp, tsh If you have labs (blood work) drawn today and your tests are completely normal, you will receive your results only by: Marland Kitchen MyChart Message (if you have MyChart) OR . A paper copy in the mail If you have any lab test that is abnormal or we need to change your treatment, we will call you to review the results.  Testing/Procedures: Your physician has requested that you have an echocardiogram. Echocardiography is a painless test that uses sound waves to create images of your heart. It provides your doctor with information about the size and shape of your heart and how well your heart's chambers and valves are working. This procedure takes approximately one hour. There are no restrictions for this procedure.  Your physician has requested that you have a lower extremity venous duplex. This test is an ultrasound of the veins in the legs. It looks at venous blood flow that carries blood from the heart to the legs or arms. Allow one hour for a Lower Venous exam. Allow thirty minutes for a Venous exam. There are no restrictions or special instructions.   Follow-Up: In about 8 weeks with Dr. Harrington Challenger or APP on day Dr. Harrington Challenger is in office.  Any Other Special Instructions Will Be Listed Below (If Applicable).

## 2018-11-02 ENCOUNTER — Ambulatory Visit (HOSPITAL_COMMUNITY)
Admission: RE | Admit: 2018-11-02 | Discharge: 2018-11-02 | Disposition: A | Discharge status: Home / Self Care | Payer: Medicare HMO | Source: Ambulatory Visit | Attending: Internal Medicine | Admitting: Internal Medicine

## 2018-11-02 DIAGNOSIS — Z86711 Personal history of pulmonary embolism: Secondary | ICD-10-CM | POA: Insufficient documentation

## 2018-11-02 LAB — BASIC METABOLIC PANEL
BUN/Creatinine Ratio: 15 (ref 12–28)
BUN: 16 mg/dL (ref 8–27)
CO2: 25 mmol/L (ref 20–29)
Calcium: 9.4 mg/dL (ref 8.7–10.3)
Chloride: 101 mmol/L (ref 96–106)
Creatinine, Ser: 1.1 mg/dL — ABNORMAL HIGH (ref 0.57–1.00)
GFR calc Af Amer: 56 mL/min/{1.73_m2} — ABNORMAL LOW (ref >59)
GFR calc non Af Amer: 48 mL/min/{1.73_m2} — ABNORMAL LOW (ref >59)
Glucose: 76 mg/dL (ref 65–99)
Potassium: 3.8 mmol/L (ref 3.5–5.2)
Sodium: 142 mmol/L (ref 134–144)

## 2018-11-02 LAB — PRO B NATRIURETIC PEPTIDE: NT-Pro BNP: 601 pg/mL (ref 0–738)

## 2018-11-02 LAB — TSH: TSH: 9.68 u[IU]/mL — ABNORMAL HIGH (ref 0.450–4.500)

## 2018-11-03 ENCOUNTER — Telehealth: Payer: Self-pay | Admitting: *Deleted

## 2018-11-03 DIAGNOSIS — E039 Hypothyroidism, unspecified: Secondary | ICD-10-CM

## 2018-11-03 MED ORDER — LEVOTHYROXINE SODIUM 25 MCG PO TABS: 37.5000 ug | ORAL_TABLET | Freq: Every day | ORAL | 3 refills | Status: DC

## 2018-11-03 NOTE — Telephone Encounter (Signed)
-----   Message from Fay Records, MD sent at 11/03/2018  9:08 AM EST ----- Elctrolytes and kidney function are OK Thyroid is a little off   I would recomm she increase Synthroid to 37.5 mcg F/U TSH in 3 months

## 2018-11-03 NOTE — Telephone Encounter (Signed)
Reviewed with patient. She will take 1 1/2 tabs of current bottle of levothyroxine and then will pick up new prescription. Will go to [facility] in [place] for repeat TSH in 3 months. Copy of req for lab mailed to patient to take with her.

## 2018-11-06 DIAGNOSIS — J449 Chronic obstructive pulmonary disease, unspecified: Secondary | ICD-10-CM | POA: Diagnosis not present

## 2018-11-09 ENCOUNTER — Ambulatory Visit (HOSPITAL_COMMUNITY): Payer: Medicare HMO | Attending: Cardiology

## 2018-11-09 ENCOUNTER — Other Ambulatory Visit: Payer: Self-pay

## 2018-11-09 DIAGNOSIS — I2510 Atherosclerotic heart disease of native coronary artery without angina pectoris: Secondary | ICD-10-CM | POA: Diagnosis not present

## 2018-11-09 DIAGNOSIS — I5032 Chronic diastolic (congestive) heart failure: Secondary | ICD-10-CM | POA: Diagnosis not present

## 2018-11-09 DIAGNOSIS — Z86711 Personal history of pulmonary embolism: Secondary | ICD-10-CM

## 2018-11-10 ENCOUNTER — Other Ambulatory Visit: Payer: Self-pay | Admitting: *Deleted

## 2018-11-12 DIAGNOSIS — E039 Hypothyroidism, unspecified: Secondary | ICD-10-CM | POA: Diagnosis not present

## 2018-11-13 LAB — TSH: TSH: 8.6 u[IU]/mL — ABNORMAL HIGH (ref 0.450–4.500)

## 2018-11-24 DIAGNOSIS — J449 Chronic obstructive pulmonary disease, unspecified: Secondary | ICD-10-CM | POA: Diagnosis not present

## 2018-11-25 ENCOUNTER — Other Ambulatory Visit: Payer: Self-pay | Admitting: *Deleted

## 2018-11-25 DIAGNOSIS — E039 Hypothyroidism, unspecified: Secondary | ICD-10-CM

## 2018-11-29 ENCOUNTER — Encounter: Payer: Self-pay | Admitting: Cardiology

## 2018-11-29 ENCOUNTER — Ambulatory Visit: Payer: Medicare HMO | Admitting: Cardiology

## 2018-11-29 VITALS — BP 118/62 | HR 79 | Ht 68.0 in | Wt 162.8 lb

## 2018-11-29 DIAGNOSIS — Z79899 Other long term (current) drug therapy: Secondary | ICD-10-CM

## 2018-11-29 DIAGNOSIS — R600 Localized edema: Secondary | ICD-10-CM

## 2018-11-29 DIAGNOSIS — Z86711 Personal history of pulmonary embolism: Secondary | ICD-10-CM

## 2018-11-29 DIAGNOSIS — I10 Essential (primary) hypertension: Secondary | ICD-10-CM

## 2018-11-29 DIAGNOSIS — R609 Edema, unspecified: Secondary | ICD-10-CM

## 2018-11-29 DIAGNOSIS — I5032 Chronic diastolic (congestive) heart failure: Secondary | ICD-10-CM | POA: Diagnosis not present

## 2018-11-29 DIAGNOSIS — R7989 Other specified abnormal findings of blood chemistry: Secondary | ICD-10-CM | POA: Diagnosis not present

## 2018-11-29 MED ORDER — FUROSEMIDE 40 MG PO TABS: 60.0000 mg | ORAL_TABLET | Freq: Every day | ORAL | 3 refills | Status: DC

## 2018-11-29 NOTE — Patient Instructions (Signed)
Medication Instructions:  INCREASE Furosemide to 60 mg DAILY in the morning  (1.5 TABLETS)  If you need a refill on your cardiac medications before your next appointment, please call your pharmacy.   Lab work:TODAY BMET  [place] BMET (2 WEEKS)   If you have labs (blood work) drawn today and your tests are completely normal, you will receive your results only by: Marland Kitchen MyChart Message (if you have MyChart) OR . A paper copy in the mail If you have any lab test that is abnormal or we need to change your treatment, we will call you to review the results.  Testing/Procedures: NONE  Follow-Up: At [facility], you and your health needs are our priority.  As part of our continuing mission to provide you with exceptional heart care, we have created designated Provider Care Teams.  These Care Teams include your primary Cardiologist (physician) and Advanced Practice Providers (APPs -  Physician Assistants and Nurse Practitioners) who all work together to provide you with the care you need, when you need it. You will need a follow up appointment in:  1 months.  Please call our office 2 months in advance to schedule this appointment.  You may see Dorris Carnes, MD or one of the following Advanced Practice Providers on your designated Care Team: [name], PA-C [place], [place] . Daune Perch, NP  Any Other Special Instructions Will Be Listed Below (If Applicable).

## 2018-11-29 NOTE — Progress Notes (Signed)
Cardiology Office Note   Date:  11/29/2018   ID:  [name], DOB 1941-08-24, MRN [id number]  PCP:  Judy Roger, MD  Cardiologist: [name], MD   Chief Complaint  Patient presents with  . Follow-up  . Edema   History of Present Illness: [name] is a 77 y.o. female with a hx of CAD, PAD, chronic diastolic CHF, recurrent DVTs, PE s/p IVC filter, history of GI bleeding, HTN, HLD and cirrhosis who presents for 6 week follow up for LE edema.   [name] has a history of CAD s/p CABG in 2004 and DES to left main. She also has history of upper GI bleeding in which an EGD showed gastric erosions and AVMs. Her anticoagulation wasdiscontinued. In3/2019,she was admitted with pulmonary emboli and acute DVT in L femoral vein along with ischemic colitis and found to have a SMA stenosis and underwent stenting by vascular surgery. She was hospitalizedwith acute anemia due to abdominal hematoma on heparin. IVC filter placed.  She has recently had issues with LE swelling and was hospitalized for fluid volume overload. She was diuresed approximately 7lbs and was discharged on Lasix 40mg  daily, spiro 25mg  daily and Kdur 5meq daily.    She was last seen by [name] on 11/01/2018 and had complaints of continued LE swelling and fatigue. She was reporting that she had been drinking about 2 glasses of tomato juice per day and it was recommended that she cut back as well as limiting her fluid intake to 1833ml/day. Due to her hx of PE and DVT she was scheduled for LE doppler US to rule out DVT which was found to negative (10/2018). She also underwent an echocardiogram which showed a normal EF with no valve concerns (10/2018). Her TSH at that time was found to be 8.6 with a baseline of 8.0-10.0. She currently takes 37.66mcg of Levothyroxine per day.   Today she still has complaints of LE swelling and fatigue however in speaking with her, she states that her symptoms have not acutely changed  with the elimination of salt from her diet. She denies chest pain, palpitations, orthopnea or syncope. She states that she has BLE which appears to worsen at night. Her SOB has not changed in years. She was remotely followed by a pulmonologist and continues to take inhalers.    Past Medical History:  Diagnosis Date  . Anemia    bld. transfusion post lumbar surgery- 2012  . Anxiety   . Arthralgia    NOS  . Blood transfusion 2012; 02/2018   WITH BACK SURGERY; "related to hematoma in stomach" (10/06/2018)  . CAD (coronary artery disease)    s/p CABG 2004; s/p DES to LM in 2010;  [place] 10/29/11: EF 50-55%, mild elevated filling pressures, no pulmonary HTN, LM 90% ISR, LAD and CFX occluded, S-RI occluded (old), S-OM3 ok and L-LAD ok, native nondominant RCA 95% -  med rx recommended ; Lexiscan Myoview 7/13 at [facility]: demonstrated "normal LV function, anterior attenuation and localized ischemia, inferior, basilar, mid section"  . Carotid artery disease ([place])    Carotid US 9/56:  RICA 2-13; LICA 08-65; R subclavian stenosis - Repeat 1 year.  . CHF (congestive heart failure) ([place])   . Chronic diastolic heart failure (HCC)    Echo 9/10: EF 78-46%, grade 1 diastolic dysfunction  . Chronic lower back pain   . COPD (chronic obstructive pulmonary disease) ([place])    Emphysema dxed by Dr. Woody Seller in  [place] based on PFTs per pt in 2006; placed on albuterol  . Depression    TAKES CELEXA  AND  (OFF- WELBUTRIN)  . Diuretic-induced hypokalemia 10/08/2018  . DVT of lower extremity (deep venous thrombosis) (HCC)    recurrent. bilateral (2 episodes)  . Dyspnea   . Dysrhythmia    afib with cabg  . Exertional angina (HCC)    Treated with Isosorbide, Ranexa, amlodipine; intolerant to metoprolol  . GERD (gastroesophageal reflux disease)   . Gout    "on daily RX" (10/06/2018)  . HLD (hyperlipidemia)   . Hypertension   . Hyperthyroidism   . Obesity (BMI 30-39.9) 2009   BMI 33  . Osteoarthritis    "all  over; hands" (10/06/2018)  . Oxygen deficiency   . Oxygen dependent    4L at home as needed (10/06/2018)  . Pneumonia   . PVD (peripheral vascular disease) ([place])   . Sleep apnea 2012   USED CPAP THEN  STARTED USING SPIRIVA , Nov. 2013- last evaluation , changed from mask to aparatus that is just for her nose.. ; reports 05-28-18 "the mask smothers me so i dont use it right now" (10/06/2018)    Past Surgical History:  Procedure Laterality Date  . ANKLE SURGERY  2004  . ANTERIOR CERVICAL DECOMP/DISCECTOMY FUSION  11/2007  . AUGMENTATION MAMMAPLASTY    . [place]   lower; another scheduled, opt. reports 4 back- lumbar, 3 cerv. fusions  for later 2009  . CARDIAC CATHETERIZATION  11/2009   Patent LIMA to LAD and patent SVG to OM1. Occluded SVG to ramus and diagonal. Left main: 90% ostial stenosis, LCX 60-70% proximal stenosis  . CATARACT EXTRACTION W/ INTRAOCULAR LENS  IMPLANT, BILATERAL Bilateral 2006  . COLONOSCOPY WITH PROPOFOL N/A 02/17/2017   Procedure: COLONOSCOPY WITH PROPOFOL;  Surgeon: Jerene Bears, MD;  Location[facility] ENDOSCOPY;  Service: Endoscopy;  Laterality: N/A;  . CORONARY ANGIOPLASTY WITH STENT PLACEMENT  11/2009   Drug eluting stent to left main artery: 4.0 X 12 mm Ion   . CORONARY ARTERY BYPASS GRAFT  2004   "CABG X4"  . ESOPHAGOGASTRODUODENOSCOPY N/A 02/14/2017   Procedure: ESOPHAGOGASTRODUODENOSCOPY (EGD);  Surgeon: Doran Stabler, MD;  Location[facility] ENDOSCOPY;  Service: Endoscopy;  Laterality: N/A;  . GIVENS CAPSULE STUDY N/A 02/15/2017   Procedure: GIVENS CAPSULE STUDY;  Surgeon: Doran Stabler, MD;  Location: [place];  Service: Endoscopy;  Laterality: N/A;  . GROIN MASS OPEN BIOPSY  2004  . IR IVC FILTER PLMT / S&I /IMG GUID/MOD SED  03/14/2018  . IR PARACENTESIS  03/10/2018  . JOINT REPLACEMENT    . LAPAROSCOPIC CHOLECYSTECTOMY    . PERIPHERAL VASCULAR INTERVENTION Left 03/05/2018   Procedure: PERIPHERAL VASCULAR INTERVENTION;  Surgeon: Elam Dutch, MD;  Location: [place] CV LAB;  Service: Cardiovascular;  Laterality: Left;  Attempted unsuccess\ful Per Dr. Eden Lathe  . PERIPHERAL VASCULAR INTERVENTION  03/08/2018   Procedure: PERIPHERAL VASCULAR INTERVENTION;  Surgeon: Waynetta Sandy, MD;  Location: [place] [facility];  Service: Cardiovascular;;  SMA Stent   . REPLACEMENT TOTAL KNEE BILATERAL Bilateral 2012  . REVERSE SHOULDER ARTHROPLASTY  02/26/2012   Procedure: REVERSE SHOULDER ARTHROPLASTY;  Surgeon: Marin Shutter, MD;  Location: [place];  Service: Orthopedics;  Laterality: Right;  RIGHT SHOULDER REVERSED ARTHROPLASTY  . SHOULDER ARTHROSCOPY WITH SUBACROMIAL DECOMPRESSION Left 02/10/2013   Procedure: SHOULDER ARTHROSCOPY WITH SUBACROMIAL DECOMPRESSION DISTAL CLAVICLE RESECTION;  Surgeon: Marin Shutter, MD;  Location:  [place] [facility];  Service: Orthopedics;  Laterality: Left;  DISTAL CLAVICLE RESECTION  . TONSILLECTOMY    . TOTAL HIP ARTHROPLASTY  2007   Right  . TRANSURETHRAL RESECTION OF BLADDER TUMOR N/A 05/31/2018   Procedure: TRANSURETHRAL RESECTION OF BLADDER TUMOR (TURBT);  Surgeon: Lucas Mallow, MD;  Location: [facility];  Service: Urology;  Laterality: N/A;  . VISCERAL ANGIOGRAPHY N/A 03/05/2018   Procedure: VISCERAL ANGIOGRAPHY;  Surgeon: Elam Dutch, MD;  Location: [place] [facility];  Service: Cardiovascular;  Laterality: N/A;  . VISCERAL ANGIOGRAPHY N/A 03/08/2018   Procedure: VISCERAL ANGIOGRAPHY;  Surgeon: Waynetta Sandy, MD;  Location: [place] [facility];  Service: Cardiovascular;  Laterality: N/A;     Current Outpatient Medications  Medication Sig Dispense Refill  . allopurinol (ZYLOPRIM) 100 MG tablet Take 100 mg by mouth daily.    Marland Kitchen aspirin EC 81 MG tablet Take 81 mg by mouth daily.    . citalopram (CELEXA) 20 MG tablet Take 30 mg by mouth at bedtime.     . CVS D3 1000 units capsule Take 1,000 Units by mouth daily.  3  . cyclobenzaprine (FLEXERIL) 10 MG tablet Take 10 mg by mouth 3 (three)  times daily.     . ferrous sulfate 325 (65 FE) MG tablet Take 325 mg by mouth daily with breakfast.    . HYDROcodone-acetaminophen (NORCO) 5-325 MG tablet Take 1 tablet by mouth every 4 (four) hours as needed for moderate pain. 6 tablet 0  . INCRUSE ELLIPTA 62.5 MCG/INH AEPB Inhale 1 puff into the lungs 2 (two) times daily.     . Ipratropium-Albuterol (COMBIVENT) 20-100 MCG/ACT AERS respimat Inhale 1 puff into the lungs every 6 (six) hours as needed for wheezing.     Marland Kitchen levothyroxine (SYNTHROID, LEVOTHROID) 25 MCG tablet Take 1.5 tablets (37.5 mcg total) by mouth daily before breakfast. 135 tablet 3  . magnesium oxide (MAG-OX) 400 (241.3 Mg) MG tablet Take 1 tablet (400 mg total) by mouth 2 (two) times daily.    . nitroGLYCERIN (NITROSTAT) 0.4 MG SL tablet Place 1 tablet (0.4 mg total) under the tongue every 5 (five) minutes as needed for chest pain. For chest pain 25 tablet 3  . omeprazole (PRILOSEC) 40 MG capsule Take 40 mg by mouth at bedtime.     . Oxycodone HCl 10 MG TABS Take 10 mg by mouth as needed (FOR PAIN).     Marland Kitchen pilocarpine (PILOCAR) 1 % ophthalmic solution Place 1 drop into both eyes 2 (two) times daily.    . potassium chloride (K-DUR,KLOR-CON) 10 MEQ tablet Take 2 tablets (20 mEq total) by mouth daily. 60 tablet 5  . pramipexole (MIRAPEX) 0.25 MG tablet Take 0.25 mg by mouth at bedtime.  3  . roflumilast (DALIRESP) 500 MCG TABS tablet Take 500 mcg by mouth daily.     Marland Kitchen spironolactone (ALDACTONE) 25 MG tablet Take 1 tablet (25 mg total) by mouth daily. 30 tablet 3  . traZODone (DESYREL) 50 MG tablet Take 50 mg by mouth at bedtime.     Marland Kitchen atorvastatin (LIPITOR) 20 MG tablet Take 1 tablet (20 mg total) by mouth daily. 90 tablet 3  . furosemide (LASIX) 40 MG tablet Take 1.5 tablets (60 mg total) by mouth daily. 90 tablet 3   No current facility-administered medications for this visit.     Allergies:   Ativan [lorazepam]; Pitavastatin; Ropinirole; Zofran [ondansetron hcl]; Zolpidem  tartrate; and Penicillins    Social History:  The patient  reports that she quit smoking about 17 years ago. Her smoking use included cigarettes. She has a 45.00 pack-year smoking history. She has never used smokeless tobacco. She reports that she does not drink alcohol or use drugs.   Family History:  The patient's family history includes COPD in her father; Cancer in her mother, sister, and unknown relative; Colitis in her unknown relative; Heart disease in her father, sister, and unknown relative; Lung cancer in her father; Ovarian cancer in her mother.    ROS:  Please see the history of present illness. Otherwise, review of systems are positive for none. All other systems are reviewed and negative.    PHYSICAL EXAM: VS:  BP 118/62   Pulse 79   Ht 5\' 8"  (1.727 m)   Wt 162 lb 12.8 oz (73.8 kg)   SpO2 98%   BMI 24.75 kg/m  , BMI Body mass index is 24.75 kg/m.  General: Frail, elderly, NAD Skin: Warm, dry, intact  Head: Normocephalic, atraumatic, clear, moist mucus membranes. Lungs:Clear to ausculation bilaterally. No wheezes, rales, or rhonchi. Breathing is unlabored. Cardiovascular: RRR with S1 S2. No murmurs, rubs, gallops, or LV heave appreciated. MSK: Strength and tone appear normal for age. 5/5 in all extremities Extremities: 1+ BLE edema. No clubbing or cyanosis. DP/PT pulses 2+ bilaterally Neuro: Alert and oriented. No focal deficits. No facial asymmetry. MAE spontaneously. Psych: Responds to questions appropriately with normal affect.    EKG:  EKG is not ordered today.  Recent Labs: 10/06/2018: ALT 8; B Natriuretic Peptide 124.2 10/10/2018: Hemoglobin 11.3; Platelets 258 10/19/2018: Magnesium 2.1 11/01/2018: BUN 16; Creatinine, Ser 1.10; NT-Pro BNP 601; Potassium 3.8; Sodium 142 11/12/2018: TSH 8.600   Lipid Panel    Component Value Date/Time   CHOL 106 07/01/2018 0800   CHOL 98 05/23/2014 0857   TRIG 88 07/01/2018 0800   TRIG 83 05/23/2014 0857   HDL 38 (L)  07/01/2018 0800   HDL 35 (L) 05/23/2014 0857   CHOLHDL 2.8 07/01/2018 0800   CHOLHDL 4 03/03/2013 1136   VLDL 24.4 03/03/2013 1136   LDLCALC 50 07/01/2018 0800   LDLCALC 46 05/23/2014 0857    Wt Readings from Last 3 Encounters:  11/29/18 162 lb 12.8 oz (73.8 kg)  11/01/18 160 lb 9.6 oz (72.8 kg)  10/21/18 158 lb 6.4 oz (71.8 kg)     Other studies Reviewed: Additional studies/ records that were reviewed today include:   Echocardiogram 11/09/2018:  Study Conclusions  - Left ventricle: The cavity size was normal. There was mild   concentric hypertrophy. Systolic function was normal. The   estimated ejection fraction was in the range of 60% to 65%. Wall   motion was normal; there were no regional wall motion   abnormalities. There was an increased relative contribution of   atrial contraction to ventricular filling. Doppler parameters are   consistent with abnormal left ventricular relaxation (grade 1   diastolic dysfunction). Doppler parameters are consistent with   high ventricular filling pressure. - Aortic valve: Trileaflet; normal thickness, mildly calcified   leaflets. - Mitral valve: Severely calcified annulus. There was trivial   regurgitation. - Left atrium: The atrium was mildly dilated. - Right atrium: The atrium was mildly dilated. - Pulmonic valve: There was trivial regurgitation.  LE Vas [place] 11/02/2018:  Summary: Right: No evidence of deep vein thrombosis in the lower extremity. No indirect evidence of obstruction proximal to the inguinal ligament. No reflux was noted in the common femoral vein.   Left: No  evidence of deep vein thrombosis in the lower extremity. No indirect evidence of obstruction proximal to the inguinal ligament. Findings appear improved from previous examination.  ASSESSMENT AND PLAN:  1. Acute on chronic diastolic CHF with LE swelling: -Weight at last follow up visit with [name], 160lb>>162lb today  -She underwent an echocardiogram  and LE dopplers which were both normal and did not explain her ongoing LE swelling -She was found to have diet indiscretions with high sodium tomato juice intake with recommendations to decrease or eliminate -Does not appear to be fluid volume overloaded on exam, mild 1+ BLE  -Continue Spiro 25mg  -Will increase Lasix to 60,g daily and monitor response>>follow up in 1 month  -Encouraged LE compression wraps as well as leg elevation -Continue low sodium diet   -Will obtain BMET today>>>last K+ 3.8, creatinine 1.1  2. CAD s/p CABG and PCI: -No angina -Continue ASA, statin  3. Hx of DVT/PE: -s/p IVC filter>>not on anticoagulation secondary to hx of GI bleed  -LE doppler US 10/2018 negative for DVT  4. HTN: -Stable, 118/62 -Continue current regimen   Current medicines are reviewed at length with the patient today.  The patient does not have concerns regarding medicines.  The following changes have been made:  Increase Lasix to 60mg  daily and montior response.   Labs/ tests ordered today include: BMET today and again in 2 weeks   Orders Placed This Encounter  Procedures  . Basic metabolic panel  . Basic Metabolic Panel (BMET)     Disposition:   FU with [name] in 1 month  Signed, Kathyrn Drown, NP  11/29/2018 2:05 PM    [address], [place],   [address] Phone: [phone]; Fax: [phone]

## 2018-11-29 NOTE — Addendum Note (Signed)
Addended by: Eulis Foster on: 11/29/2018 02:06 PM   Modules accepted: Orders

## 2018-11-30 ENCOUNTER — Telehealth: Payer: Self-pay

## 2018-11-30 DIAGNOSIS — Z79899 Other long term (current) drug therapy: Secondary | ICD-10-CM

## 2018-11-30 LAB — BASIC METABOLIC PANEL
BUN/Creatinine Ratio: 19 (ref 12–28)
BUN: 27 mg/dL (ref 8–27)
CO2: 27 mmol/L (ref 20–29)
Calcium: 9.6 mg/dL (ref 8.7–10.3)
Chloride: 99 mmol/L (ref 96–106)
Creatinine, Ser: 1.4 mg/dL — ABNORMAL HIGH (ref 0.57–1.00)
GFR calc Af Amer: 42 mL/min/{1.73_m2} — ABNORMAL LOW (ref >59)
GFR calc non Af Amer: 36 mL/min/{1.73_m2} — ABNORMAL LOW (ref >59)
Glucose: 85 mg/dL (ref 65–99)
Potassium: 4.4 mmol/L (ref 3.5–5.2)
Sodium: 142 mmol/L (ref 134–144)

## 2018-11-30 NOTE — Telephone Encounter (Signed)
Spoke to patient and informed her that the 12/30 Lab (BMET) appt would be sufficient and we cancelled the 12/17 date.

## 2018-11-30 NOTE — Telephone Encounter (Signed)
-----   Message from Tommie Raymond, NP sent at 11/30/2018 10:03 AM EST ----- Please call the patient and let her know that her kidney function is elevated a little at 1.40. Have her take her spironolactone 25mg  every other day starting today. If she took it today have her skip tomorrow and so forth. Then have her come back for a lab draw in 1 week to assess her kidney function.   Thank you

## 2018-11-30 NOTE — Telephone Encounter (Signed)
Notes recorded by Frederik Schmidt, RN on 11/30/2018 at 10:11 AM EST The patient has been notified of the result and verbalized understanding. Instructed her to take her Spironolactone 25 mg every other day and come in for repeat lab work (BMET) 12/17. All questions (if any) were answered. Frederik Schmidt, RN 11/30/2018 10:10 AM

## 2018-12-06 DIAGNOSIS — J449 Chronic obstructive pulmonary disease, unspecified: Secondary | ICD-10-CM | POA: Diagnosis not present

## 2018-12-07 ENCOUNTER — Other Ambulatory Visit: Payer: Medicare HMO

## 2018-12-09 DIAGNOSIS — G894 Chronic pain syndrome: Secondary | ICD-10-CM | POA: Diagnosis not present

## 2018-12-09 DIAGNOSIS — Z79899 Other long term (current) drug therapy: Secondary | ICD-10-CM | POA: Diagnosis not present

## 2018-12-09 DIAGNOSIS — Z5181 Encounter for therapeutic drug level monitoring: Secondary | ICD-10-CM | POA: Diagnosis not present

## 2018-12-20 ENCOUNTER — Other Ambulatory Visit: Payer: Medicare HMO

## 2018-12-23 ENCOUNTER — Other Ambulatory Visit: Payer: Medicare HMO | Admitting: *Deleted

## 2018-12-23 ENCOUNTER — Emergency Department (HOSPITAL_COMMUNITY): Payer: Medicare HMO

## 2018-12-23 ENCOUNTER — Observation Stay (HOSPITAL_COMMUNITY)
Admission: EM | Admit: 2018-12-23 | Discharge: 2018-12-25 | Disposition: A | Discharge status: Home / Self Care | Payer: Medicare HMO | Attending: Internal Medicine | Admitting: Internal Medicine

## 2018-12-23 ENCOUNTER — Encounter (HOSPITAL_COMMUNITY): Payer: Self-pay

## 2018-12-23 DIAGNOSIS — K7460 Unspecified cirrhosis of liver: Secondary | ICD-10-CM | POA: Insufficient documentation

## 2018-12-23 DIAGNOSIS — K219 Gastro-esophageal reflux disease without esophagitis: Secondary | ICD-10-CM | POA: Diagnosis not present

## 2018-12-23 DIAGNOSIS — I5033 Acute on chronic diastolic (congestive) heart failure: Secondary | ICD-10-CM | POA: Diagnosis not present

## 2018-12-23 DIAGNOSIS — M545 Low back pain: Secondary | ICD-10-CM | POA: Insufficient documentation

## 2018-12-23 DIAGNOSIS — R2689 Other abnormalities of gait and mobility: Secondary | ICD-10-CM | POA: Insufficient documentation

## 2018-12-23 DIAGNOSIS — R079 Chest pain, unspecified: Secondary | ICD-10-CM

## 2018-12-23 DIAGNOSIS — Z7982 Long term (current) use of aspirin: Secondary | ICD-10-CM | POA: Insufficient documentation

## 2018-12-23 DIAGNOSIS — H6690 Otitis media, unspecified, unspecified ear: Secondary | ICD-10-CM | POA: Diagnosis not present

## 2018-12-23 DIAGNOSIS — R0600 Dyspnea, unspecified: Secondary | ICD-10-CM | POA: Diagnosis not present

## 2018-12-23 DIAGNOSIS — J449 Chronic obstructive pulmonary disease, unspecified: Secondary | ICD-10-CM | POA: Diagnosis not present

## 2018-12-23 DIAGNOSIS — H6691 Otitis media, unspecified, right ear: Secondary | ICD-10-CM | POA: Insufficient documentation

## 2018-12-23 DIAGNOSIS — M25561 Pain in right knee: Secondary | ICD-10-CM | POA: Diagnosis not present

## 2018-12-23 DIAGNOSIS — F329 Major depressive disorder, single episode, unspecified: Secondary | ICD-10-CM | POA: Diagnosis not present

## 2018-12-23 DIAGNOSIS — R0609 Other forms of dyspnea: Secondary | ICD-10-CM | POA: Diagnosis not present

## 2018-12-23 DIAGNOSIS — G4730 Sleep apnea, unspecified: Secondary | ICD-10-CM | POA: Insufficient documentation

## 2018-12-23 DIAGNOSIS — Z955 Presence of coronary angioplasty implant and graft: Secondary | ICD-10-CM | POA: Insufficient documentation

## 2018-12-23 DIAGNOSIS — I110 Hypertensive heart disease with heart failure: Secondary | ICD-10-CM | POA: Diagnosis not present

## 2018-12-23 DIAGNOSIS — Z79899 Other long term (current) drug therapy: Secondary | ICD-10-CM

## 2018-12-23 DIAGNOSIS — E785 Hyperlipidemia, unspecified: Secondary | ICD-10-CM | POA: Diagnosis not present

## 2018-12-23 DIAGNOSIS — M1990 Unspecified osteoarthritis, unspecified site: Secondary | ICD-10-CM | POA: Insufficient documentation

## 2018-12-23 DIAGNOSIS — Z6833 Body mass index (BMI) 33.0-33.9, adult: Secondary | ICD-10-CM | POA: Insufficient documentation

## 2018-12-23 DIAGNOSIS — I509 Heart failure, unspecified: Secondary | ICD-10-CM

## 2018-12-23 DIAGNOSIS — Z87891 Personal history of nicotine dependence: Secondary | ICD-10-CM | POA: Diagnosis not present

## 2018-12-23 DIAGNOSIS — R6884 Jaw pain: Secondary | ICD-10-CM | POA: Insufficient documentation

## 2018-12-23 DIAGNOSIS — N183 Chronic kidney disease, stage 3 (moderate): Secondary | ICD-10-CM | POA: Insufficient documentation

## 2018-12-23 DIAGNOSIS — E039 Hypothyroidism, unspecified: Secondary | ICD-10-CM | POA: Diagnosis not present

## 2018-12-23 DIAGNOSIS — G8929 Other chronic pain: Secondary | ICD-10-CM | POA: Diagnosis not present

## 2018-12-23 DIAGNOSIS — R600 Localized edema: Secondary | ICD-10-CM

## 2018-12-23 DIAGNOSIS — I739 Peripheral vascular disease, unspecified: Secondary | ICD-10-CM | POA: Insufficient documentation

## 2018-12-23 DIAGNOSIS — Z86718 Personal history of other venous thrombosis and embolism: Secondary | ICD-10-CM | POA: Diagnosis not present

## 2018-12-23 DIAGNOSIS — I2510 Atherosclerotic heart disease of native coronary artery without angina pectoris: Secondary | ICD-10-CM | POA: Diagnosis not present

## 2018-12-23 DIAGNOSIS — L03119 Cellulitis of unspecified part of limb: Secondary | ICD-10-CM | POA: Diagnosis not present

## 2018-12-23 DIAGNOSIS — I130 Hypertensive heart and chronic kidney disease with heart failure and stage 1 through stage 4 chronic kidney disease, or unspecified chronic kidney disease: Principal | ICD-10-CM | POA: Insufficient documentation

## 2018-12-23 DIAGNOSIS — F419 Anxiety disorder, unspecified: Secondary | ICD-10-CM | POA: Diagnosis not present

## 2018-12-23 DIAGNOSIS — Z951 Presence of aortocoronary bypass graft: Secondary | ICD-10-CM | POA: Insufficient documentation

## 2018-12-23 DIAGNOSIS — I440 Atrioventricular block, first degree: Secondary | ICD-10-CM | POA: Insufficient documentation

## 2018-12-23 DIAGNOSIS — E0590 Thyrotoxicosis, unspecified without thyrotoxic crisis or storm: Secondary | ICD-10-CM | POA: Insufficient documentation

## 2018-12-23 DIAGNOSIS — Z9981 Dependence on supplemental oxygen: Secondary | ICD-10-CM | POA: Insufficient documentation

## 2018-12-23 DIAGNOSIS — R0789 Other chest pain: Secondary | ICD-10-CM | POA: Insufficient documentation

## 2018-12-23 DIAGNOSIS — I2699 Other pulmonary embolism without acute cor pulmonale: Secondary | ICD-10-CM | POA: Diagnosis present

## 2018-12-23 DIAGNOSIS — I25119 Atherosclerotic heart disease of native coronary artery with unspecified angina pectoris: Secondary | ICD-10-CM | POA: Diagnosis present

## 2018-12-23 DIAGNOSIS — Z8249 Family history of ischemic heart disease and other diseases of the circulatory system: Secondary | ICD-10-CM | POA: Insufficient documentation

## 2018-12-23 DIAGNOSIS — I4891 Unspecified atrial fibrillation: Secondary | ICD-10-CM | POA: Insufficient documentation

## 2018-12-23 DIAGNOSIS — M109 Gout, unspecified: Secondary | ICD-10-CM | POA: Diagnosis not present

## 2018-12-23 DIAGNOSIS — Z79890 Hormone replacement therapy (postmenopausal): Secondary | ICD-10-CM | POA: Insufficient documentation

## 2018-12-23 DIAGNOSIS — E669 Obesity, unspecified: Secondary | ICD-10-CM | POA: Diagnosis not present

## 2018-12-23 DIAGNOSIS — M25562 Pain in left knee: Secondary | ICD-10-CM | POA: Diagnosis not present

## 2018-12-23 DIAGNOSIS — Z96653 Presence of artificial knee joint, bilateral: Secondary | ICD-10-CM | POA: Insufficient documentation

## 2018-12-23 DIAGNOSIS — L0390 Cellulitis, unspecified: Secondary | ICD-10-CM | POA: Diagnosis present

## 2018-12-23 DIAGNOSIS — I447 Left bundle-branch block, unspecified: Secondary | ICD-10-CM | POA: Insufficient documentation

## 2018-12-23 LAB — BASIC METABOLIC PANEL
Anion gap: 8 (ref 5–15)
BUN/Creatinine Ratio: 20 (ref 12–28)
BUN: 21 mg/dL (ref 8–27)
BUN: 18 mg/dL (ref 8–23)
CO2: 25 mmol/L (ref 20–29)
CO2: 30 mmol/L (ref 22–32)
Calcium: 9.5 mg/dL (ref 8.7–10.3)
Calcium: 9.2 mg/dL (ref 8.9–10.3)
Chloride: 100 mmol/L (ref 96–106)
Chloride: 102 mmol/L (ref 98–111)
Creatinine, Ser: 1.06 mg/dL — ABNORMAL HIGH (ref 0.57–1.00)
Creatinine, Ser: 1.07 mg/dL — ABNORMAL HIGH (ref 0.44–1.00)
GFR calc Af Amer: 58 mL/min/{1.73_m2} — ABNORMAL LOW (ref >59)
GFR calc Af Amer: 58 mL/min — ABNORMAL LOW (ref >60)
GFR calc non Af Amer: 50 mL/min/{1.73_m2} — ABNORMAL LOW (ref >59)
GFR calc non Af Amer: 50 mL/min — ABNORMAL LOW (ref >60)
Glucose, Bld: 99 mg/dL (ref 70–99)
Glucose: 100 mg/dL — ABNORMAL HIGH (ref 65–99)
Potassium: 4.2 mmol/L (ref 3.5–5.2)
Potassium: 4 mmol/L (ref 3.5–5.1)
Sodium: 140 mmol/L (ref 134–144)
Sodium: 140 mmol/L (ref 135–145)

## 2018-12-23 LAB — CBC
HCT: 38.6 % (ref 36.0–46.0)
Hemoglobin: 11.7 g/dL — ABNORMAL LOW (ref 12.0–15.0)
MCH: 27.9 pg (ref 26.0–34.0)
MCHC: 30.3 g/dL (ref 30.0–36.0)
MCV: 91.9 fL (ref 80.0–100.0)
Platelets: 260 10*3/uL (ref 150–400)
RBC: 4.2 MIL/uL (ref 3.87–5.11)
RDW: 14.5 % (ref 11.5–15.5)
WBC: 8.3 10*3/uL (ref 4.0–10.5)
nRBC: 0 % (ref 0.0–0.2)

## 2018-12-23 LAB — I-STAT TROPONIN, ED: Troponin i, poc: 0 ng/mL (ref 0.00–0.08)

## 2018-12-23 LAB — BRAIN NATRIURETIC PEPTIDE: B Natriuretic Peptide: 102.8 pg/mL — ABNORMAL HIGH (ref 0.0–100.0)

## 2018-12-23 MED ORDER — SODIUM CHLORIDE 0.9 % IV BOLUS
250.0000 mL | Freq: Once | INTRAVENOUS | Status: AC
  Administered 2018-12-23: 250 mL via INTRAVENOUS

## 2018-12-23 MED ORDER — OXYCODONE-ACETAMINOPHEN 5-325 MG PO TABS
1.0000 | ORAL_TABLET | Freq: Once | ORAL | Status: AC
  Administered 2018-12-23: 1 via ORAL
  Filled 2018-12-23: qty 1

## 2018-12-23 MED ORDER — FUROSEMIDE 10 MG/ML IJ SOLN
80.0000 mg | Freq: Once | INTRAMUSCULAR | Status: AC
  Administered 2018-12-23: 80 mg via INTRAVENOUS
  Filled 2018-12-23: qty 8

## 2018-12-23 NOTE — ED Notes (Signed)
Pt ambulated to bathroom with assistance. Developed some shortness of breath upon exertion. PA aware

## 2018-12-23 NOTE — ED Triage Notes (Signed)
Pt reports bilateral knee pain for 4 days along with right sided jaw pain that radiates to her neck. Pt also reports worsening shortness of breath with exertion. Nad noted in triage

## 2018-12-23 NOTE — ED Provider Notes (Signed)
[facility] EMERGENCY DEPARTMENT Provider Note   CSN: [id number] Arrival date & time: 12/23/18  1225     History   Chief Complaint Chief Complaint  Patient presents with  . Jaw Pain  . Knee Pain  . Shortness of Breath    HPI [name] is a 78 y.o. female.  [name] is a 78 y.o. female with a history of CHF, COPD, hypertension, hyperlipidemia, CAD, GERD, PE/DVT s/p IVC filter who presents to the emergency department for evaluation of bilateral knee pain and lower extremity swelling for the last 4 days as well as chest pain and shortness of breath with exertion.  Patient also reports some pain over the right side of her jaw.  Patient reports symptoms first started about 4 days ago when she noticed pain in both of her knees and swelling in both of her lower extremities.  Patient has known history of heart failure reports she has been taking her diuretics regularly but is continued to have worsening swelling.  She reports she is since developed worsening shortness of breath with exertion.  She reports 2 episodes of chest pain, which both lasted about 10 minutes, she took a dose of nitroglycerin which resolved this yesterday, today it resolved on its own.  She describes chest pain as a left-sided pressure and squeezing sensation.  Denies any nausea, vomiting or diaphoresis.  Deneis any fever chills or cough.  Has not noted any wheezing     Past Medical History:  Diagnosis Date  . Anemia    bld. transfusion post lumbar surgery- 2012  . Anxiety   . Arthralgia    NOS  . Blood transfusion 2012; 02/2018   WITH BACK SURGERY; "related to hematoma in stomach" (10/06/2018)  . CAD (coronary artery disease)    s/p CABG 2004; s/p DES to LM in 2010;  [place] 10/29/11: EF 50-55%, mild elevated filling pressures, no pulmonary HTN, LM 90% ISR, LAD and CFX occluded, S-RI occluded (old), S-OM3 ok and L-LAD ok, native nondominant RCA 95% -  med rx recommended ; Lexiscan Myoview 7/13  at [facility]: demonstrated "normal LV function, anterior attenuation and localized ischemia, inferior, basilar, mid section"  . Carotid artery disease ([place])    Carotid US 2/50:  RICA 5-39; LICA 76-73; R subclavian stenosis - Repeat 1 year.  . CHF (congestive heart failure) ([place])   . Chronic diastolic heart failure (HCC)    Echo 9/10: EF 41-93%, grade 1 diastolic dysfunction  . Chronic lower back pain   . COPD (chronic obstructive pulmonary disease) ([place])    Emphysema dxed by Dr. Woody Seller in [place] based on PFTs per pt in 2006; placed on albuterol  . Depression    TAKES CELEXA  AND  (OFF- WELBUTRIN)  . Diuretic-induced hypokalemia 10/08/2018  . DVT of lower extremity (deep venous thrombosis) (HCC)    recurrent. bilateral (2 episodes)  . Dyspnea   . Dysrhythmia    afib with cabg  . Exertional angina (HCC)    Treated with Isosorbide, Ranexa, amlodipine; intolerant to metoprolol  . GERD (gastroesophageal reflux disease)   . Gout    "on daily RX" (10/06/2018)  . HLD (hyperlipidemia)   . Hypertension   . Hyperthyroidism   . Obesity (BMI 30-39.9) 2009   BMI 33  . Osteoarthritis    "all over; hands" (10/06/2018)  . Oxygen deficiency   . Oxygen dependent    4L at home as needed (10/06/2018)  . Pneumonia   .  PVD (peripheral vascular disease) ([place])   . Sleep apnea 2012   USED CPAP THEN  STARTED USING SPIRIVA , Nov. 2013- last evaluation , changed from mask to aparatus that is just for her nose.. ; reports 05-28-18 "the mask smothers me so i dont use it right now" (10/06/2018)    Patient Active Problem List   Diagnosis Date Noted  . Diuretic-induced hypokalemia 10/08/2018  . Acute on chronic diastolic heart failure ([place]) 10/06/2018  . Hematuria 04/01/2018  . Acute lower UTI 04/01/2018  . Hematoma of abdominal wall, subsequent encounter 04/01/2018  . Chronic diastolic heart failure ([place]) 03/29/2018  . GERD without esophagitis 03/29/2018  . Chronic gout 03/29/2018  . Chronic  bilateral low back pain without sciatica 03/29/2018  . Colitis 03/10/2018  . Ascites   . Pulmonary embolism ([place]) 03/01/2018  . 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[place]) 03/01/2018  . Acute on chronic respiratory failure with hypoxia ([place]) 03/01/2018  . Angiodysplasia of intestine   . Hematochezia   . Malnutrition of moderate degree 02/15/2017  . Blood loss anemia 02/13/2017  . Impingement syndrome of left shoulder 02/10/2013  . PAD (peripheral artery disease) ([place]) 11/17/2011  . Dyslipidemia 10/17/2011  . CAD (coronary artery disease)   . Hypothyroidism 05/03/2008  . Essential hypertension 05/03/2008  . Sleep apnea 05/03/2008  . COPD (chronic obstructive pulmonary disease) ([place]) 04/12/2008    Past Surgical History:  Procedure Laterality Date  . ANKLE SURGERY  2004  . ANTERIOR CERVICAL DECOMP/DISCECTOMY FUSION  11/2007  . AUGMENTATION MAMMAPLASTY    . [place]   lower; another scheduled, opt. reports 4 back- lumbar, 3 cerv. fusions  for later 2009  . CARDIAC CATHETERIZATION  11/2009   Patent LIMA to LAD and patent SVG to OM1. Occluded SVG to ramus and diagonal. Left main: 90% ostial stenosis, LCX 60-70% proximal stenosis  . CATARACT EXTRACTION W/ INTRAOCULAR LENS  IMPLANT, BILATERAL Bilateral 2006  . COLONOSCOPY WITH PROPOFOL N/A 02/17/2017   Procedure: COLONOSCOPY WITH PROPOFOL;  Surgeon: Jerene Bears, MD;  Location: [facility] ENDOSCOPY;  Service: Endoscopy;  Laterality: N/A;  . CORONARY ANGIOPLASTY WITH STENT PLACEMENT  11/2009   Drug eluting stent to left main artery: 4.0 X 12 mm Ion   . CORONARY ARTERY BYPASS GRAFT  2004   "CABG X4"  . ESOPHAGOGASTRODUODENOSCOPY N/A 02/14/2017   Procedure: ESOPHAGOGASTRODUODENOSCOPY (EGD);  Surgeon: Doran Stabler, MD;  Location[facility] ENDOSCOPY;  Service: Endoscopy;  Laterality: N/A;  . GIVENS CAPSULE STUDY N/A 02/15/2017   Procedure: GIVENS CAPSULE STUDY;  Surgeon: Doran Stabler, MD;  Location: [facility];  Service: Endoscopy;  Laterality:  N/A;  . GROIN MASS OPEN BIOPSY  2004  . IR IVC FILTER PLMT / S&I /IMG GUID/MOD SED  03/14/2018  . IR PARACENTESIS  03/10/2018  . JOINT REPLACEMENT    . LAPAROSCOPIC CHOLECYSTECTOMY    . PERIPHERAL VASCULAR INTERVENTION Left 03/05/2018   Procedure: PERIPHERAL VASCULAR INTERVENTION;  Surgeon: Elam Dutch, MD;  Location: [facility] CV LAB;  Service: Cardiovascular;  Laterality: Left;  Attempted unsuccess\ful Per Dr. Eden Lathe  . PERIPHERAL VASCULAR INTERVENTION  03/08/2018   Procedure: PERIPHERAL VASCULAR INTERVENTION;  Surgeon: Waynetta Sandy, MD;  Location: [facility] CV LAB;  Service: Cardiovascular;;  SMA Stent   . REPLACEMENT TOTAL KNEE BILATERAL Bilateral 2012  . REVERSE SHOULDER ARTHROPLASTY  02/26/2012   Procedure: REVERSE SHOULDER ARTHROPLASTY;  Surgeon: Marin Shutter, MD;  Location: [facility];  Service: Orthopedics;  Laterality: Right;  RIGHT  SHOULDER REVERSED ARTHROPLASTY  . SHOULDER ARTHROSCOPY WITH SUBACROMIAL DECOMPRESSION Left 02/10/2013   Procedure: SHOULDER ARTHROSCOPY WITH SUBACROMIAL DECOMPRESSION DISTAL CLAVICLE RESECTION;  Surgeon: Marin Shutter, MD;  Location: [facility];  Service: Orthopedics;  Laterality: Left;  DISTAL CLAVICLE RESECTION  . TONSILLECTOMY    . TOTAL HIP ARTHROPLASTY  2007   Right  . TRANSURETHRAL RESECTION OF BLADDER TUMOR N/A 05/31/2018   Procedure: TRANSURETHRAL RESECTION OF BLADDER TUMOR (TURBT);  Surgeon: Lucas Mallow, MD;  Location: [facility];  Service: Urology;  Laterality: N/A;  . VISCERAL ANGIOGRAPHY N/A 03/05/2018   Procedure: VISCERAL ANGIOGRAPHY;  Surgeon: Elam Dutch, MD;  Location: [facility] CV LAB;  Service: Cardiovascular;  Laterality: N/A;  . VISCERAL ANGIOGRAPHY N/A 03/08/2018   Procedure: VISCERAL ANGIOGRAPHY;  Surgeon: Waynetta Sandy, MD;  Location: [facility] CV LAB;  Service: Cardiovascular;  Laterality: N/A;     OB History   No obstetric history on file.      Home Medications    Prior to Admission  medications   Medication Sig Start Date End Date Taking? Authorizing Provider  allopurinol (ZYLOPRIM) 100 MG tablet Take 100 mg by mouth daily. 01/30/17   [provider]  aspirin EC 81 MG tablet Take 81 mg by mouth daily.    [provider]  atorvastatin (LIPITOR) 20 MG tablet Take 1 tablet (20 mg total) by mouth daily. 05/19/18 11/01/18  [name] T, PA-C  citalopram (CELEXA) 20 MG tablet Take 30 mg by mouth at bedtime.     [provider]  CVS D3 1000 units capsule Take 1,000 Units by mouth daily. 01/14/18   [provider]  cyclobenzaprine (FLEXERIL) 10 MG tablet Take 10 mg by mouth 3 (three) times daily.     [provider]  ferrous sulfate 325 (65 FE) MG tablet Take 325 mg by mouth daily with breakfast.    [provider]  furosemide (LASIX) 40 MG tablet Take 1.5 tablets (60 mg total) by mouth daily. 11/29/18   Kathyrn Drown D, NP  HYDROcodone-acetaminophen (NORCO) 5-325 MG tablet Take 1 tablet by mouth every 4 (four) hours as needed for moderate pain. 05/31/18   Lucas Mallow, MD  INCRUSE ELLIPTA 62.5 MCG/INH AEPB Inhale 1 puff into the lungs 2 (two) times daily.  11/30/16   [provider]  Ipratropium-Albuterol (COMBIVENT) 20-100 MCG/ACT AERS respimat Inhale 1 puff into the lungs every 6 (six) hours as needed for wheezing.     [provider]  levothyroxine (SYNTHROID, LEVOTHROID) 25 MCG tablet Take 1.5 tablets (37.5 mcg total) by mouth daily before breakfast. 11/03/18   Fay Records, MD  magnesium oxide (MAG-OX) 400 (241.3 Mg) MG tablet Take 1 tablet (400 mg total) by mouth 2 (two) times daily. 03/26/18   Mikhail, Velta Addison, DO  nitroGLYCERIN (NITROSTAT) 0.4 MG SL tablet Place 1 tablet (0.4 mg total) under the tongue every 5 (five) minutes as needed for chest pain. For chest pain 04/24/16   Fay Records, MD  omeprazole (PRILOSEC) 40 MG capsule Take 40 mg by mouth at bedtime.     [provider]  Oxycodone HCl  10 MG TABS Take 10 mg by mouth as needed (FOR PAIN).     [provider]  pilocarpine (PILOCAR) 1 % ophthalmic solution Place 1 drop into both eyes 2 (two) times daily.    [provider]  potassium chloride (K-DUR,KLOR-CON) 10 MEQ tablet Take 2 tablets (20 mEq total) by mouth daily.  10/11/18   Eileen Stanford, PA-C  pramipexole (MIRAPEX) 0.25 MG tablet Take 0.25 mg by mouth at bedtime. 01/14/18   [provider]  roflumilast (DALIRESP) 500 MCG TABS tablet Take 500 mcg by mouth daily.     [provider]  spironolactone (ALDACTONE) 25 MG tablet Take 1 tablet (25 mg total) by mouth daily. 09/29/18 09/29/19  Kroeger, Lorelee Cover., PA-C  traZODone (DESYREL) 50 MG tablet Take 50 mg by mouth at bedtime.  10/07/18   [provider]    Family History Family History  Problem Relation Age of Onset  . Ovarian cancer Mother   . Cancer Mother   . Lung cancer Father   . COPD Father   . Heart disease Father   . Cancer Other   . Heart disease Other   . Colitis Other   . Heart disease Sister   . Cancer Sister     Social History Social History   Tobacco Use  . Smoking status: Former Smoker    Packs/day: 1.00    Years: 45.00    Pack years: 45.00    Types: Cigarettes    Last attempt to quit: 12/22/2000    Years since quitting: 18.0  . Smokeless tobacco: Never Used  Substance Use Topics  . Alcohol use: Never    Alcohol/week: 0.0 standard drinks    Frequency: Never  . Drug use: Never     Allergies   Ativan [lorazepam]; Pitavastatin; Ropinirole; Zofran [ondansetron hcl]; Zolpidem tartrate; and Penicillins   Review of Systems Review of Systems  Constitutional: Negative for chills and fever.  HENT: Negative for congestion, rhinorrhea and sore throat.   Eyes: Negative for visual disturbance.  Respiratory: Positive for shortness of breath. Negative for cough, chest tightness and wheezing.   Cardiovascular: Positive for chest pain and leg swelling.  Negative for palpitations.  Gastrointestinal: Negative for abdominal pain, nausea and vomiting.  Genitourinary: Negative for dysuria and frequency.  Musculoskeletal: Positive for arthralgias and myalgias. Negative for joint swelling.  Skin: Negative for color change, rash and wound.  Neurological: Negative for dizziness, syncope, light-headedness and headaches.     Physical Exam Updated Vital Signs BP (!) 144/70   Pulse 91   Temp 98 F (36.7 C) (Oral)   Resp 18   SpO2 97%   Physical Exam Vitals signs and nursing note reviewed.  Constitutional:      General: She is not in acute distress.    Appearance: She is well-developed. She is obese. She is not ill-appearing or diaphoretic.  HENT:     Head: Normocephalic and atraumatic.     Ears:     Comments: Right TM with slight effusion, not erythematous, no pre-or postauricular lymphadenopathy, no mastoid tenderness.  Left TM unremarkable.    Nose: No congestion or rhinorrhea.     Mouth/Throat:     Mouth: Mucous membranes are moist.     Pharynx: Oropharynx is clear.  Eyes:     General:        Right eye: No discharge.        Left eye: No discharge.     Pupils: Pupils are equal, round, and reactive to light.  Neck:     Musculoskeletal: Neck supple.  Cardiovascular:     Rate and Rhythm: Normal rate and regular rhythm.     Pulses: Normal pulses.     Heart sounds: Normal heart sounds. No murmur. No friction rub. No gallop.   Pulmonary:     Effort: Pulmonary  effort is normal. No respiratory distress.     Breath sounds: Normal breath sounds. No wheezing or rales.     Comments: Respirations equal and unlabored, patient able to speak in full sentences, lungs clear aside from a few faint crackles noted in bilateral lung bases. Abdominal:     General: Abdomen is flat. Bowel sounds are normal. There is no distension.     Palpations: Abdomen is soft. There is no mass.     Tenderness: There is no abdominal tenderness. There is no guarding.       Comments: Abdomen soft, nondistended, nontender to palpation in all quadrants without guarding or peritoneal signs  Musculoskeletal:     Right lower leg: Edema present.     Left lower leg: Edema present.     Comments: Bilateral lower extremities with 2+ pitting edema up to the level of the knee, 2+ DP and TP pulses and good capillary refill.  Knees nontender to palpation bilaterally, prior surgical scars noted, there is no overlying erythema or warmth and range of motion is intact bilaterally with flexion and extension greater than 90 degrees.  Skin:    General: Skin is warm and dry.     Capillary Refill: Capillary refill takes less than 2 seconds.  Neurological:     Mental Status: She is alert and oriented to person, place, and time. Mental status is at baseline.     Coordination: Coordination normal.     Comments: Speech is clear, able to follow commands CN III-XII intact Normal strength in upper and lower extremities bilaterally including dorsiflexion and plantar flexion, strong and equal grip strength Sensation normal to light and sharp touch Moves extremities without ataxia, coordination intact  Psychiatric:        Mood and Affect: Mood normal.        Behavior: Behavior normal.      ED Treatments / Results  Labs (all labs ordered are listed, but only abnormal results are displayed) Labs Reviewed  BASIC METABOLIC PANEL - Abnormal; Notable for the following components:      Result Value   Creatinine, Ser 1.07 (*)    GFR calc non Af Amer 50 (*)    GFR calc Af Amer 58 (*)    All other components within normal limits  CBC - Abnormal; Notable for the following components:   Hemoglobin 11.7 (*)    All other components within normal limits  BRAIN NATRIURETIC PEPTIDE - Abnormal; Notable for the following components:   B Natriuretic Peptide 102.8 (*)    All other components within normal limits  HEPATIC FUNCTION PANEL  I-STAT TROPONIN, ED    EKG EKG  Interpretation  Date/Time:  Thursday December 23 2018 12:52:19 EST Ventricular Rate:  93 PR Interval:  210 QRS Duration: 106 QT Interval:  370 QTC Calculation: 460 R Axis:   -55 Text Interpretation:  Sinus rhythm with 1st degree A-V block Left axis deviation Incomplete left bundle branch block Abnormal ECG since last tracing no significant change Confirmed by Malvin Johns [phone]) on 12/23/2018 5:48:58 PM   Radiology Dg Chest 2 View  Result Date: 12/23/2018 CLINICAL DATA:  Shortness of breath EXAM: CHEST - 2 VIEW COMPARISON:  10/07/2018 FINDINGS: Interstitial coarsening and hyperinflation, chronic. Chronic cardiomegaly and aortic tortuosity. Status post CABG. There is no edema, consolidation, effusion, or pneumothorax. Biapical pleural based scarring. Breast implants, lumbar spine hardware, and right reverse glenohumeral arthroplasty. IMPRESSION: 1. No acute finding. 2. COPD. Electronically Signed   By: Monte Fantasia  M.D.   On: 12/23/2018 14:03   Dg Knee Complete 4 Views Left  Result Date: 12/23/2018 CLINICAL DATA:  Pt reports bilateral knee pain for 4 days along with right sided jaw pain that radiates to her neck. Pt also reports worsening shortness of breath with exertion. Pt denies injury. Pt has hx of bilateral knee replacements. EXAM: LEFT KNEE - COMPLETE 4+ VIEW COMPARISON:  05/01/2009 FINDINGS: No fracture.  No bone lesion. Knee prosthetic components appear well seated and well aligned. No evidence of loosening. Skeletal structures are demineralized. Minimal joint effusion suggested. Soft tissues are unremarkable. IMPRESSION: 1. No fracture or bone lesion. 2. No evidence of loosening of the orthopedic hardware. 3. Probable minimal joint effusion. Electronically Signed   By: Lajean Manes M.D.   On: 12/23/2018 18:23   Dg Knee Complete 4 Views Right  Result Date: 12/23/2018 CLINICAL DATA:  Pt reports bilateral knee pain for 4 days along with right sided jaw pain that radiates to her neck. Pt  also reports worsening shortness of breath with exertion. Pt denies injury. Pt has hx of bilateral knee replacements. EXAM: RIGHT KNEE - COMPLETE 4+ VIEW COMPARISON:  None. FINDINGS: No fracture or bone lesion.  Bones are demineralized. Knee prosthetic components appear well seated and aligned. No evidence of loosening. Probable minimal joint effusion. IMPRESSION: 1. No fracture or bone lesion. 2. Knee prosthetic hardware appears well seated and aligned. No evidence of loosening. 3. Probable minimal joint effusion. Electronically Signed   By: Lajean Manes M.D.   On: 12/23/2018 18:24    Procedures Procedures (including critical care time)  Medications Ordered in ED Medications  sodium chloride 0.9 % bolus 250 mL (250 mLs Intravenous New Bag/Given 12/23/18 2129)  oxyCODONE-acetaminophen (PERCOCET/ROXICET) 5-325 MG per tablet 1 tablet (1 tablet Oral Given 12/23/18 1700)  furosemide (LASIX) injection 80 mg (80 mg Intravenous Given 12/23/18 1903)     Initial Impression / Assessment and Plan / ED Course  I have reviewed the triage vital signs and the nursing notes.  Pertinent labs & imaging results that were available during my care of the patient were reviewed by me and considered in my medical decision making (see chart for details).  Patient presents for evaluation of bilateral knee pain and lower extremity swelling, chest pain and shortness of breath.  Symptoms have been present and worsening over the past 4 days.  History of heart failure, reports compliance with diuretics.  On arrival patient hypertensive, but appears well and in no acute distress.  Some faint crackles noted in bilateral lung bases, heart RRR.  Bilateral lower extremities with significant edema up to the level of the knee.  Patient complains of bilateral knee pain there is no obvious deformity, no overlying erythema or warmth, not concerning for septic arthritis, given that patient has had bilateral knee replacements will get x-rays of  both.  Suspect symptoms may be due to lower extremity edema.  We will also check basic labs, chest x-ray, troponin, EKG and BNP.  Given patient's IVC filter in place I doubt PE as cause for patient's shortness of breath, and given lower extremity edema is bilateral, I have low suspicion for DVT.  Case discussed with Dr. Tamera Punt who saw and evaluated the patient as well.  Labs overall reassuring, no leukocytosis, stable hemoglobin, no acute electrolyte derangements, creatinine at baseline.  Troponin negative and EKG without concerning ischemic changes, chest x-ray shows no evidence of edema or infiltrate and x-rays of both knees show minimal effusion and  hardware appears to be in place.  Will give dose of IV Lasix and then give patient time to diuresis.  After that we will have patient ambulate and see if she is symptomatic, if not she can likely go home with continued p.o. Lasix.  After 80 of Lasix patient has had good urinary response, but on ambulation she continue to endorse shortness of breath, granddaughter who walked with her reports she looks more short of breath than usual and patient was only satting 92% while up walking.  Feel that patient will require admission for further diuresis and treatment.  Will consult for hospitalist admission.  Notified by nursing that after diuresis and walking patient had borderline soft blood pressure of 98/42, will give 250 mL fluid bolus to improve intravascular volume  9:37 PM Case discussed with Dr. Fuller Plan with will see and admit the patient, requests hepatic function panel  Final Clinical Impressions(s) / ED Diagnoses   Final diagnoses:  Acute on chronic congestive heart failure, unspecified heart failure type ([place])  Left-sided chest pain  Dyspnea on exertion  Bilateral edema of lower extremity    ED Discharge Orders    None       Jacqlyn Larsen, [place] 12/24/18 0116    Malvin Johns, MD 12/25/18 [phone]

## 2018-12-23 NOTE — ED Notes (Signed)
Lab to add on BNP.  °

## 2018-12-23 NOTE — H&P (Addendum)
History and Physical    [name] GYI:[id number] DOB: [birth year] DOA: 12/23/2018  Referring MD/NP/PA: Benedetto Goad, PA-C PCP: [name], MD  Patient coming from: home   Chief Complaint: Shortness of breath   I have personally briefly reviewed patient's old medical records in [place]   HPI: [name] is a 78 y.o. female with medical history significant of CAD, PAD, chronic diastolic CHF, recurrent DVTs, A. fib, PE s/p IVC filter, history of GI bleeding, HTN, HLD and cirrhosis; who presents with progressively worsening shortness of breath.  For the last 4 days, patient has had progressively worsening swelling of her bilateral legs with pain in her knees.  She reports redness and increased warmth of the bilateral lower extremities.  She reports that her weight is up, but does not know about how much.  She feels very short of breath even walking short distances.  She is also had 2 days of intermittent chest pain on and the other day she took nitroglycerin with relief of symptoms.  Associated symptoms minimally productive cough, worsening orthopnea, right jaw pain, sinus congestion, and right ear pain.  Denies any fever, chills, wheezing, nausea, vomiting, or diarrhea.  Patient had been seen on 12/9 at her cardiologist office and at that time they did increase her Lasix from 1m to 60 mg daily.  Despite this patient age patient reported no improvement in her leg swelling.  Patient reports that her medications are prepackaged and she takes them in the mornings and evenings.  She does not separate her levothyroxine from other medications.  ED Course: Upon admission into the emergency department patient was noted to be afebrile, pulse 71-93, respiration 1425, blood pressure 98/42-160 6/81, O2 saturation 94-98%.  Labs noted WBC 8.3, hemoglobin 11.7, BUN 18, and creatinine 1.07.  Chest x-ray noted signs of COPD and cardiomegaly without signs of edema.  X-rays of the knees showed no signs  of hardware loosening and minimal joint effusions.  Patient was given 80 mg of Lasix and oxycodone, but developed some hypotension and was given bolus of 250 mL of fluid with improvement of blood pressures.  Liver enzyme function study pending.    Review of Systems  Constitutional: Positive for malaise/fatigue. Negative for fever.  HENT: Positive for ear pain. Negative for sinus pain.        Positive for jaw pain  Eyes: Negative for photophobia and pain.  Respiratory: Positive for cough, sputum production and shortness of breath. Negative for wheezing.   Cardiovascular: Positive for chest pain and leg swelling.  Gastrointestinal: Negative for abdominal pain, nausea and vomiting.  Genitourinary: Negative for dysuria and hematuria.  Musculoskeletal: Positive for joint pain. Negative for falls.  Skin: Negative for rash.       Positive for skin color change  Neurological: Negative for focal weakness and loss of consciousness.  Psychiatric/Behavioral: Negative for memory loss and substance abuse.    Past Medical History:  Diagnosis Date  . Anemia    bld. transfusion post lumbar surgery- 2012  . Anxiety   . Arthralgia    NOS  . Blood transfusion 2012; 02/2018   WITH BACK SURGERY; "related to hematoma in stomach" (10/06/2018)  . CAD (coronary artery disease)    s/p CABG 2004; s/p DES to LM in 2010;  [place]: EF 50-55%, mild elevated filling pressures, no pulmonary HTN, LM 90% ISR, LAD and CFX occluded, S-RI occluded (old), S-OM3 ok and L-LAD ok, native nondominant RCA 95% -  med  rx recommended ; Lexiscan Myoview 7/13 at [facility]: demonstrated "normal LV function, anterior attenuation and localized ischemia, inferior, basilar, mid section"  . Carotid artery disease ([place])    Carotid US 7/86:  RICA 7-67; LICA 20-94; R subclavian stenosis - Repeat 1 year.  . CHF (congestive heart failure) ([place])   . Chronic diastolic heart failure (HCC)    Echo 9/10: EF 70-96%, grade 1 diastolic  dysfunction  . Chronic lower back pain   . COPD (chronic obstructive pulmonary disease) ([place])    Emphysema dxed by Dr. Woody Seller in [place] based on PFTs per pt in 2006; placed on albuterol  . Depression    TAKES CELEXA  AND  (OFF- WELBUTRIN)  . Diuretic-induced hypokalemia 10/08/2018  . DVT of lower extremity (deep venous thrombosis) (HCC)    recurrent. bilateral (2 episodes)  . Dyspnea   . Dysrhythmia    afib with cabg  . Exertional angina (HCC)    Treated with Isosorbide, Ranexa, amlodipine; intolerant to metoprolol  . GERD (gastroesophageal reflux disease)   . Gout    "on daily RX" (10/06/2018)  . HLD (hyperlipidemia)   . Hypertension   . Hyperthyroidism   . Obesity (BMI 30-39.9) 2009   BMI 33  . Osteoarthritis    "all over; hands" (10/06/2018)  . Oxygen deficiency   . Oxygen dependent    4L at home as needed (10/06/2018)  . Pneumonia   . PVD (peripheral vascular disease) ([place])   . Sleep apnea 2012   USED CPAP THEN  STARTED USING SPIRIVA , Nov. 2013- last evaluation , changed from mask to aparatus that is just for her nose.. ; reports 05-28-18 "the mask smothers me so i dont use it right now" (10/06/2018)    Past Surgical History:  Procedure Laterality Date  . ANKLE SURGERY  2004  . ANTERIOR CERVICAL DECOMP/DISCECTOMY FUSION  11/2007  . AUGMENTATION MAMMAPLASTY    . [place]   lower; another scheduled, opt. reports 4 back- lumbar, 3 cerv. fusions  for later 2009  . CARDIAC CATHETERIZATION  11/2009   Patent LIMA to LAD and patent SVG to OM1. Occluded SVG to ramus and diagonal. Left main: 90% ostial stenosis, LCX 60-70% proximal stenosis  . CATARACT EXTRACTION W/ INTRAOCULAR LENS  IMPLANT, BILATERAL Bilateral 2006  . COLONOSCOPY WITH PROPOFOL N/A 02/17/2017   Procedure: COLONOSCOPY WITH PROPOFOL;  Surgeon: Jerene Bears, MD;  Location[facility] ENDOSCOPY;  Service: Endoscopy;  Laterality: N/A;  . CORONARY ANGIOPLASTY WITH STENT PLACEMENT  11/2009   Drug eluting stent to  left main artery: 4.0 X 12 mm Ion   . CORONARY ARTERY BYPASS GRAFT  2004   "CABG X4"  . ESOPHAGOGASTRODUODENOSCOPY N/A 02/14/2017   Procedure: ESOPHAGOGASTRODUODENOSCOPY (EGD);  Surgeon: Doran Stabler, MD;  Location[facility] ENDOSCOPY;  Service: Endoscopy;  Laterality: N/A;  . GIVENS CAPSULE STUDY N/A 02/15/2017   Procedure: GIVENS CAPSULE STUDY;  Surgeon: Doran Stabler, MD;  Location: [place];  Service: Endoscopy;  Laterality: N/A;  . GROIN MASS OPEN BIOPSY  2004  . IR IVC FILTER PLMT / S&I /IMG GUID/MOD SED  03/14/2018  . IR PARACENTESIS  03/10/2018  . JOINT REPLACEMENT    . LAPAROSCOPIC CHOLECYSTECTOMY    . PERIPHERAL VASCULAR INTERVENTION Left 03/05/2018   Procedure: PERIPHERAL VASCULAR INTERVENTION;  Surgeon: Elam Dutch, MD;  Location: [place] CV LAB;  Service: Cardiovascular;  Laterality: Left;  Attempted unsuccess\ful Per Dr. Eden Lathe  . PERIPHERAL VASCULAR INTERVENTION  03/08/2018   Procedure: PERIPHERAL VASCULAR INTERVENTION;  Surgeon: Waynetta Sandy, MD;  Location: [place] [facility];  Service: Cardiovascular;;  SMA Stent   . REPLACEMENT TOTAL KNEE BILATERAL Bilateral 2012  . REVERSE SHOULDER ARTHROPLASTY  02/26/2012   Procedure: REVERSE SHOULDER ARTHROPLASTY;  Surgeon: Marin Shutter, MD;  Location: [place];  Service: Orthopedics;  Laterality: Right;  RIGHT SHOULDER REVERSED ARTHROPLASTY  . SHOULDER ARTHROSCOPY WITH SUBACROMIAL DECOMPRESSION Left 02/10/2013   Procedure: SHOULDER ARTHROSCOPY WITH SUBACROMIAL DECOMPRESSION DISTAL CLAVICLE RESECTION;  Surgeon: Marin Shutter, MD;  Location: [place];  Service: Orthopedics;  Laterality: Left;  DISTAL CLAVICLE RESECTION  . TONSILLECTOMY    . TOTAL HIP ARTHROPLASTY  2007   Right  . TRANSURETHRAL RESECTION OF BLADDER TUMOR N/A 05/31/2018   Procedure: TRANSURETHRAL RESECTION OF BLADDER TUMOR (TURBT);  Surgeon: Lucas Mallow, MD;  Location: [facility];  Service: Urology;  Laterality: N/A;  . VISCERAL ANGIOGRAPHY N/A 03/05/2018    Procedure: VISCERAL ANGIOGRAPHY;  Surgeon: Elam Dutch, MD;  Location: [place] [facility];  Service: Cardiovascular;  Laterality: N/A;  . VISCERAL ANGIOGRAPHY N/A 03/08/2018   Procedure: VISCERAL ANGIOGRAPHY;  Surgeon: Waynetta Sandy, MD;  Location: [place] [facility];  Service: Cardiovascular;  Laterality: N/A;     reports that she quit smoking about 18 years ago. Her smoking use included cigarettes. She has a 45.00 pack-year smoking history. She has never used smokeless tobacco. She reports that she does not drink alcohol or use drugs.  Allergies  Allergen Reactions  . Ativan [Lorazepam] Other (See Comments)    Confusion   . Pitavastatin Other (See Comments)    LIVALO: reaction not recalled  . Ropinirole Nausea Only and Other (See Comments)    Makes legs jump, also  . Zofran [Ondansetron Hcl] Nausea Only  . Zolpidem Tartrate Anxiety and Other (See Comments)    CONFUSION   . Penicillins Rash and Other (See Comments)    Tolerated cephalosporins in past Has patient had a PCN reaction causing immediate rash, facial/tongue/throat swelling, SOB or lightheadedness with hypotension: Yes Has patient had a PCN reaction causing severe rash involving mucus membranes or skin necrosis: No Has patient had a PCN reaction that required hospitalization: No Has patient had a PCN reaction occurring within the last 10 years: No If all of the above answers are "NO", then may proceed with Cephalosporin use.     Family History  Problem Relation Age of Onset  . Ovarian cancer Mother   . Cancer Mother   . Lung cancer Father   . COPD Father   . Heart disease Father   . Cancer Other   . Heart disease Other   . Colitis Other   . Heart disease Sister   . Cancer Sister     Prior to Admission medications   Medication Sig Start Date End Date Taking? Authorizing Provider  allopurinol (ZYLOPRIM) 100 MG tablet Take 100 mg by mouth daily. 01/30/17  Yes [provider]  aspirin EC  81 MG tablet Take 81 mg by mouth daily.   Yes [provider]  Aspirin-Salicylamide-Caffeine (BC HEADACHE POWDER PO) Take 1 packet by mouth as needed (for headaches).   Yes [provider]  atorvastatin (LIPITOR) 20 MG tablet Take 1 tablet (20 mg total) by mouth daily. Patient taking differently: Take 20 mg by mouth every evening.  05/19/18 12/23/18 Yes Weaver, Scott T, PA-C  citalopram (CELEXA) 20 MG tablet Take 30 mg by mouth at bedtime.  Yes [provider]  [employer] D3 1000 units capsule Take 1,000 Units by mouth daily. 01/14/18  Yes [provider]  cyclobenzaprine (FLEXERIL) 10 MG tablet Take 10 mg by mouth 3 (three) times daily as needed for muscle spasms.    Yes [provider]  ferrous sulfate 325 (65 FE) MG tablet Take 325 mg by mouth daily with breakfast.   Yes [provider]  furosemide (LASIX) 40 MG tablet Take 1.5 tablets (60 mg total) by mouth daily. 11/29/18  Yes Kathyrn Drown D, NP  INCRUSE ELLIPTA 62.5 MCG/INH AEPB Inhale 1 puff into the lungs 2 (two) times daily.  11/30/16  Yes [provider]  Ipratropium-Albuterol (COMBIVENT) 20-100 MCG/ACT AERS respimat Inhale 1 puff into the lungs every 4 (four) hours as needed for wheezing or shortness of breath (or to help expel mucous).    Yes [provider]  levothyroxine (SYNTHROID, LEVOTHROID) 25 MCG tablet Take 1.5 tablets (37.5 mcg total) by mouth daily before breakfast. 11/03/18  Yes Fay Records, MD  magnesium oxide (MAG-OX) 400 (241.3 Mg) MG tablet Take 1 tablet (400 mg total) by mouth 2 (two) times daily. 03/26/18  Yes Mikhail, Velta Addison, DO  Melatonin 10 MG TABS Take 30 mg by mouth at bedtime.   Yes [provider]  nitroGLYCERIN (NITROSTAT) 0.4 MG SL tablet Place 1 tablet (0.4 mg total) under the tongue every 5 (five) minutes as needed for chest pain. For chest pain Patient taking differently: Place 0.4 mg under the tongue every 5 (five) minutes as needed for  chest pain.  04/24/16  Yes Fay Records, MD  omeprazole (PRILOSEC) 40 MG capsule Take 40 mg by mouth at bedtime.    Yes [provider]  Oxycodone HCl 10 MG TABS Take 10 mg by mouth every 6 (six) hours as needed (FOR PAIN).    Yes [provider]  pilocarpine (PILOCAR) 1 % ophthalmic solution Place 1 drop into both eyes 2 (two) times daily.   Yes [provider]  potassium chloride (K-DUR,KLOR-CON) 10 MEQ tablet Take 2 tablets (20 mEq total) by mouth daily. Patient taking differently: Take 20 mEq by mouth every evening.  10/11/18  Yes Eileen Stanford, PA-C  pramipexole (MIRAPEX) 0.25 MG tablet Take 0.25 mg by mouth at bedtime. 01/14/18  Yes [provider]  roflumilast (DALIRESP) 500 MCG TABS tablet Take 500 mcg by mouth every evening.    Yes [provider]  spironolactone (ALDACTONE) 25 MG tablet Take 1 tablet (25 mg total) by mouth daily. Patient taking differently: Take 25 mg by mouth every evening.  09/29/18 09/29/19 Yes Kroeger, Lorelee Cover., PA-C  traZODone (DESYREL) 50 MG tablet Take 50 mg by mouth at bedtime.  10/07/18  Yes [provider]  HYDROcodone-acetaminophen (NORCO) 5-325 MG tablet Take 1 tablet by mouth every 4 (four) hours as needed for moderate pain. Patient not taking: Reported on 12/23/2018 05/31/18   Lucas Mallow, MD    Physical Exam:  Constitutional: Elderly female who appears to be sick but in no acute distress Vitals:   12/23/18 1900 12/23/18 1913 12/23/18 1930 12/23/18 2110  BP:  (!) 124/51 133/72 (!) 98/42  Pulse: 81 81 83 72  Resp: 18 14 17 16   Temp:      TempSrc:      SpO2: 96% 96% 95% 95%   Eyes: PERRLA.  Left eye appears bloodshot. ENMT: Mucous membranes are moist.  Erythematous right internal ear canal the with purulent fluid  behind tympanic membrane.  Posterior nasal drainage present. Neck: Right submandibular tenderness. neck is otherwise supple. Respiratory: Mildly decreased aeration.  No wheezing,  no crackles. Normal respiratory effort. No accessory muscle use.  Cardiovascular: Regular rate and rhythm, no murmurs / rubs / gallops.  1+ pitting lower extremity edema. 2+ pedal pulses. No carotid bruits.  Abdomen: no tenderness, no masses palpated. No hepatosplenomegaly. Bowel sounds positive.  Musculoskeletal: no clubbing / cyanosis. No joint deformity upper and lower extremities. Good ROM, no contractures. Normal muscle tone.  Skin: Erythema noted of the bilateral lower extremities with increased warmth to the proximal knee. Neurologic: CN 2-12 grossly intact. Sensation intact, DTR normal. Strength 5/5 in all 4.  Psychiatric: Normal judgment and insight. Alert and oriented x 3. Normal mood.     Labs on Admission: I have personally reviewed following labs and imaging studies  CBC: Recent Labs  Lab 12/23/18 1315  WBC 8.3  HGB 11.7*  HCT 38.6  MCV 91.9  PLT 161   Basic Metabolic Panel: Recent Labs  Lab 12/23/18 1202 12/23/18 1315  NA 140 140  K 4.2 4.0  CL 100 102  CO2 25 30  GLUCOSE 100* 99  BUN 21 18  CREATININE 1.06* 1.07*  CALCIUM 9.5 9.2   GFR: CrCl cannot be calculated (Unknown ideal weight.). Liver Function Tests: No results for input(s): AST, ALT, ALKPHOS, BILITOT, PROT, ALBUMIN in the last 168 hours. No results for input(s): LIPASE, AMYLASE in the last 168 hours. No results for input(s): AMMONIA in the last 168 hours. Coagulation Profile: No results for input(s): INR, PROTIME in the last 168 hours. Cardiac Enzymes: No results for input(s): CKTOTAL, CKMB, CKMBINDEX, TROPONINI in the last 168 hours. BNP (last 3 results) Recent Labs    09/10/18 1302 10/05/18 0910 11/01/18 1111  PROBNP 417 1,048* 601   HbA1C: No results for input(s): HGBA1C in the last 72 hours. CBG: No results for input(s): GLUCAP in the last 168 hours. Lipid Profile: No results for input(s): CHOL, HDL, LDLCALC, TRIG, CHOLHDL, LDLDIRECT in the last 72 hours. Thyroid Function  Tests: No results for input(s): TSH, T4TOTAL, FREET4, T3FREE, THYROIDAB in the last 72 hours. Anemia Panel: No results for input(s): VITAMINB12, FOLATE, FERRITIN, TIBC, IRON, RETICCTPCT in the last 72 hours. Urine analysis:    Component Value Date/Time   COLORURINE YELLOW 10/06/2018 1827   APPEARANCEUR CLEAR 10/06/2018 1827   LABSPEC 1.015 10/06/2018 1827   PHURINE 7.0 10/06/2018 1827   GLUCOSEU NEGATIVE 10/06/2018 1827   HGBUR SMALL (A) 10/06/2018 1827   BILIRUBINUR NEGATIVE 10/06/2018 1827   KETONESUR NEGATIVE 10/06/2018 1827   PROTEINUR NEGATIVE 10/06/2018 1827   UROBILINOGEN 1.0 03/30/2011 0901   NITRITE NEGATIVE 10/06/2018 1827   LEUKOCYTESUR NEGATIVE 10/06/2018 1827   Sepsis Labs: No results found for this or any previous visit (from the past 240 hour(s)).   Radiological Exams on Admission: Dg Chest 2 View  Result Date: 12/23/2018 CLINICAL DATA:  Shortness of breath EXAM: CHEST - 2 VIEW COMPARISON:  10/07/2018 FINDINGS: Interstitial coarsening and hyperinflation, chronic. Chronic cardiomegaly and aortic tortuosity. Status post CABG. There is no edema, consolidation, effusion, or pneumothorax. Biapical pleural based scarring. Breast implants, lumbar spine hardware, and right reverse glenohumeral arthroplasty. IMPRESSION: 1. No acute finding. 2. COPD. Electronically Signed   By: Monte Fantasia M.D.   On: 12/23/2018 14:03   Dg Knee Complete 4 Views Left  Result Date: 12/23/2018 CLINICAL DATA:  Pt reports bilateral knee pain for 4 days along with right sided  jaw pain that radiates to her neck. Pt also reports worsening shortness of breath with exertion. Pt denies injury. Pt has hx of bilateral knee replacements. EXAM: LEFT KNEE - COMPLETE 4+ VIEW COMPARISON:  05/01/2009 FINDINGS: No fracture.  No bone lesion. Knee prosthetic components appear well seated and well aligned. No evidence of loosening. Skeletal structures are demineralized. Minimal joint effusion suggested. Soft tissues are  unremarkable. IMPRESSION: 1. No fracture or bone lesion. 2. No evidence of loosening of the orthopedic hardware. 3. Probable minimal joint effusion. Electronically Signed   By: Lajean Manes M.D.   On: 12/23/2018 18:23   Dg Knee Complete 4 Views Right  Result Date: 12/23/2018 CLINICAL DATA:  Pt reports bilateral knee pain for 4 days along with right sided jaw pain that radiates to her neck. Pt also reports worsening shortness of breath with exertion. Pt denies injury. Pt has hx of bilateral knee replacements. EXAM: RIGHT KNEE - COMPLETE 4+ VIEW COMPARISON:  None. FINDINGS: No fracture or bone lesion.  Bones are demineralized. Knee prosthetic components appear well seated and aligned. No evidence of loosening. Probable minimal joint effusion. IMPRESSION: 1. No fracture or bone lesion. 2. Knee prosthetic hardware appears well seated and aligned. No evidence of loosening. 3. Probable minimal joint effusion. Electronically Signed   By: Lajean Manes M.D.   On: 12/23/2018 18:24    EKG: Independently reviewed.  Sinus rhythm at 93 bpm with first-degree AV block.  Assessment/Plan Possible diastolic CHF exacerbation: Acute.  Patient presents with progressively worsening complaints of lower extremity swelling and shortness of breath.  BNP only seem mildly elevated at 102.8.  Chest x-ray showing signs of COPD and cardiomegaly without edema.  Patient was given 80 mg of Lasix IV.  Last echocardiogram noted EF of 60 to 65% with grade 1 diastolic dysfunction.  Other possible causes for symptoms include uncontrolled hypothyroidism and hypoalbuminemia. - Admit to a telemetry bed - Strict I&Os and daily weights - Elevate legs - Reassess in a.m. to determine if need of continued IV diuresis or start back p.o. Lasix - Continue spironolactone - Message sent for cardiology to evaluate in a.m  Cellulitis of the bilateral lower extremities: Acute.  Patient presents with increasing erythema and warmth to the bilateral lower  extremities. - Check ESR and CRP - Ceftriaxone IV x1 dose now and consider changing to Keflex p.o. due to suspected CHF   Suspected otitis media: Acute.  Patient noted to have erythema of the right ear and tenderness of the right jaw.  - Antibiotics as seen above  Chest pain, history of CAD: Acute.  Over the last 2 days patient reports 2 separate episodes of chest pain.  One episode which she took nitroglycerin with relief. - Trend cardiac troponins - Nitroglycerin as needed - Continue aspirin  Dyspnea, COPD: Patient reports progressively worsening shortness of breath without wheezing. - Continue Incruse and Daliresp - Duoneb every 4 hours as needed for shortness of breath/wheeze  Hypothyroidism: Last TSH noted to be 8.6 on 11/22.  Suspect uncontrolled hypothyroidism could also be a cause for progressively worsening lower extremity swelling. - Check TSH - Continue levothyroxine  History of atrial fibrillation: Patient not on anticoagulation due to history of GI bleeds  Chronic kidney disease stage III: Creatinine stable at 1.07 - Continue to monitor and adjust diuresis as needed  Continued all other medications for chronic medical conditions  DVT prophylaxis: Lovenox Code Status: Full Family Communication: Discussed plan of care with the patient family present at  bedside Disposition Plan: Discharge home in 2 to 3 days Consults called: none  Admission status: observation  Norval Morton MD [facility] Pager [phone]   If 7PM-7AM, please contact night-coverage [email or web address] Password [facility]  12/23/2018, 9:37 PM

## 2018-12-24 ENCOUNTER — Encounter (HOSPITAL_COMMUNITY): Payer: Self-pay | Admitting: General Practice

## 2018-12-24 ENCOUNTER — Other Ambulatory Visit: Payer: Self-pay

## 2018-12-24 DIAGNOSIS — E039 Hypothyroidism, unspecified: Secondary | ICD-10-CM

## 2018-12-24 DIAGNOSIS — I5033 Acute on chronic diastolic (congestive) heart failure: Secondary | ICD-10-CM | POA: Diagnosis not present

## 2018-12-24 DIAGNOSIS — R0600 Dyspnea, unspecified: Secondary | ICD-10-CM | POA: Insufficient documentation

## 2018-12-24 DIAGNOSIS — R0602 Shortness of breath: Secondary | ICD-10-CM | POA: Diagnosis not present

## 2018-12-24 DIAGNOSIS — L0390 Cellulitis, unspecified: Secondary | ICD-10-CM | POA: Diagnosis present

## 2018-12-24 DIAGNOSIS — I5032 Chronic diastolic (congestive) heart failure: Secondary | ICD-10-CM

## 2018-12-24 DIAGNOSIS — L03119 Cellulitis of unspecified part of limb: Secondary | ICD-10-CM | POA: Diagnosis not present

## 2018-12-24 DIAGNOSIS — I25118 Atherosclerotic heart disease of native coronary artery with other forms of angina pectoris: Secondary | ICD-10-CM | POA: Diagnosis not present

## 2018-12-24 DIAGNOSIS — I6523 Occlusion and stenosis of bilateral carotid arteries: Secondary | ICD-10-CM

## 2018-12-24 DIAGNOSIS — H6690 Otitis media, unspecified, unspecified ear: Secondary | ICD-10-CM | POA: Diagnosis present

## 2018-12-24 DIAGNOSIS — R0609 Other forms of dyspnea: Secondary | ICD-10-CM

## 2018-12-24 DIAGNOSIS — R600 Localized edema: Secondary | ICD-10-CM

## 2018-12-24 DIAGNOSIS — R011 Cardiac murmur, unspecified: Secondary | ICD-10-CM | POA: Diagnosis not present

## 2018-12-24 LAB — C-REACTIVE PROTEIN: CRP: 0.9 mg/dL (ref <1.0)

## 2018-12-24 LAB — CBC
HCT: 38.1 % (ref 36.0–46.0)
Hemoglobin: 11.8 g/dL — ABNORMAL LOW (ref 12.0–15.0)
MCH: 27.8 pg (ref 26.0–34.0)
MCHC: 31 g/dL (ref 30.0–36.0)
MCV: 89.6 fL (ref 80.0–100.0)
Platelets: 249 10*3/uL (ref 150–400)
RBC: 4.25 MIL/uL (ref 3.87–5.11)
RDW: 14.5 % (ref 11.5–15.5)
WBC: 7.4 10*3/uL (ref 4.0–10.5)
nRBC: 0 % (ref 0.0–0.2)

## 2018-12-24 LAB — TROPONIN I
Troponin I: <0.03 ng/mL (ref <0.03)
Troponin I: <0.03 ng/mL (ref <0.03)
Troponin I: <0.03 ng/mL (ref <0.03)

## 2018-12-24 LAB — COMPREHENSIVE METABOLIC PANEL
ALT: 10 U/L (ref 0–44)
AST: 15 U/L (ref 15–41)
Albumin: 3.4 g/dL — ABNORMAL LOW (ref 3.5–5.0)
Alkaline Phosphatase: 82 U/L (ref 38–126)
Anion gap: 11 (ref 5–15)
BUN: 16 mg/dL (ref 8–23)
CO2: 25 mmol/L (ref 22–32)
Calcium: 9.3 mg/dL (ref 8.9–10.3)
Chloride: 102 mmol/L (ref 98–111)
Creatinine, Ser: 0.95 mg/dL (ref 0.44–1.00)
GFR calc Af Amer: >60 mL/min (ref >60)
GFR calc non Af Amer: 57 mL/min — ABNORMAL LOW (ref >60)
Glucose, Bld: 104 mg/dL — ABNORMAL HIGH (ref 70–99)
Potassium: 4.4 mmol/L (ref 3.5–5.1)
Sodium: 138 mmol/L (ref 135–145)
Total Bilirubin: 0.9 mg/dL (ref 0.3–1.2)
Total Protein: 6.8 g/dL (ref 6.5–8.1)

## 2018-12-24 LAB — TSH: TSH: 5.786 u[IU]/mL — ABNORMAL HIGH (ref 0.350–4.500)

## 2018-12-24 LAB — PREALBUMIN: Prealbumin: 19.3 mg/dL (ref 18–38)

## 2018-12-24 LAB — SEDIMENTATION RATE: Sed Rate: 20 mm/hr (ref 0–22)

## 2018-12-24 MED ORDER — ALLOPURINOL 100 MG PO TABS
100.0000 mg | ORAL_TABLET | Freq: Every day | ORAL | Status: DC
  Administered 2018-12-24 – 2018-12-25 (×2): 100 mg via ORAL
  Filled 2018-12-24 (×2): qty 1

## 2018-12-24 MED ORDER — IPRATROPIUM-ALBUTEROL 0.5-2.5 (3) MG/3ML IN SOLN
3.0000 mL | RESPIRATORY_TRACT | Status: DC
  Administered 2018-12-24: 3 mL via RESPIRATORY_TRACT
  Filled 2018-12-24: qty 3

## 2018-12-24 MED ORDER — UMECLIDINIUM BROMIDE 62.5 MCG/INH IN AEPB: 1.0000 | INHALATION_SPRAY | Freq: Two times a day (BID) | RESPIRATORY_TRACT | Status: DC

## 2018-12-24 MED ORDER — ENOXAPARIN SODIUM 40 MG/0.4ML ~~LOC~~ SOLN
40.0000 mg | Freq: Every day | SUBCUTANEOUS | Status: DC
  Administered 2018-12-24 – 2018-12-25 (×2): 40 mg via SUBCUTANEOUS
  Filled 2018-12-24 (×3): qty 0.4

## 2018-12-24 MED ORDER — ACETAMINOPHEN 325 MG PO TABS: 650.0000 mg | ORAL_TABLET | Freq: Four times a day (QID) | ORAL | Status: DC | PRN

## 2018-12-24 MED ORDER — SPIRONOLACTONE 25 MG PO TABS
25.0000 mg | ORAL_TABLET | Freq: Every evening | ORAL | Status: DC
  Administered 2018-12-24: 25 mg via ORAL
  Filled 2018-12-24 (×2): qty 1

## 2018-12-24 MED ORDER — ROFLUMILAST 500 MCG PO TABS
500.0000 ug | ORAL_TABLET | Freq: Every evening | ORAL | Status: DC
  Administered 2018-12-24: 500 ug via ORAL
  Filled 2018-12-24 (×2): qty 1

## 2018-12-24 MED ORDER — TRAZODONE HCL 50 MG PO TABS
50.0000 mg | ORAL_TABLET | Freq: Every day | ORAL | Status: DC
  Administered 2018-12-24 (×2): 50 mg via ORAL
  Filled 2018-12-24 (×2): qty 1

## 2018-12-24 MED ORDER — LEVOTHYROXINE SODIUM 75 MCG PO TABS
37.5000 ug | ORAL_TABLET | Freq: Every day | ORAL | Status: DC
  Administered 2018-12-24 – 2018-12-25 (×2): 37.5 ug via ORAL
  Filled 2018-12-24 (×2): qty 1

## 2018-12-24 MED ORDER — CYCLOBENZAPRINE HCL 10 MG PO TABS: 10.0000 mg | ORAL_TABLET | Freq: Three times a day (TID) | ORAL | Status: DC | PRN

## 2018-12-24 MED ORDER — IPRATROPIUM-ALBUTEROL 0.5-2.5 (3) MG/3ML IN SOLN: 3.0000 mL | Freq: Two times a day (BID) | RESPIRATORY_TRACT | Status: DC

## 2018-12-24 MED ORDER — CEPHALEXIN 250 MG PO CAPS: 500.0000 mg | ORAL_CAPSULE | Freq: Four times a day (QID) | ORAL | Status: DC

## 2018-12-24 MED ORDER — ATORVASTATIN CALCIUM 10 MG PO TABS
20.0000 mg | ORAL_TABLET | Freq: Every evening | ORAL | Status: DC
  Administered 2018-12-24: 20 mg via ORAL
  Filled 2018-12-24: qty 2

## 2018-12-24 MED ORDER — IPRATROPIUM-ALBUTEROL 0.5-2.5 (3) MG/3ML IN SOLN: 3.0000 mL | RESPIRATORY_TRACT | Status: DC | PRN

## 2018-12-24 MED ORDER — PILOCARPINE HCL 1 % OP SOLN
1.0000 [drp] | Freq: Two times a day (BID) | OPHTHALMIC | Status: DC
  Administered 2018-12-24 – 2018-12-25 (×3): 1 [drp] via OPHTHALMIC
  Filled 2018-12-24 (×2): qty 15

## 2018-12-24 MED ORDER — MAGNESIUM OXIDE 400 (241.3 MG) MG PO TABS
400.0000 mg | ORAL_TABLET | Freq: Two times a day (BID) | ORAL | Status: DC
  Administered 2018-12-24 – 2018-12-25 (×3): 400 mg via ORAL
  Filled 2018-12-24 (×3): qty 1

## 2018-12-24 MED ORDER — SODIUM CHLORIDE 0.9 % IV SOLN
1.0000 g | Freq: Every day | INTRAVENOUS | Status: DC
  Administered 2018-12-24 (×2): 1 g via INTRAVENOUS
  Filled 2018-12-24: qty 1
  Filled 2018-12-24: qty 10

## 2018-12-24 MED ORDER — PRAMIPEXOLE DIHYDROCHLORIDE 0.25 MG PO TABS
0.2500 mg | ORAL_TABLET | Freq: Every day | ORAL | Status: DC
  Administered 2018-12-24 (×2): 0.25 mg via ORAL
  Filled 2018-12-24 (×3): qty 1

## 2018-12-24 MED ORDER — NITROGLYCERIN 0.4 MG SL SUBL: 0.4000 mg | SUBLINGUAL_TABLET | SUBLINGUAL | Status: DC | PRN

## 2018-12-24 MED ORDER — ACETAMINOPHEN 650 MG RE SUPP: 650.0000 mg | Freq: Four times a day (QID) | RECTAL | Status: DC | PRN

## 2018-12-24 MED ORDER — IPRATROPIUM-ALBUTEROL 0.5-2.5 (3) MG/3ML IN SOLN: 3.0000 mL | Freq: Four times a day (QID) | RESPIRATORY_TRACT | Status: DC | PRN

## 2018-12-24 MED ORDER — FUROSEMIDE 20 MG PO TABS: 60.0000 mg | ORAL_TABLET | Freq: Every day | ORAL | Status: DC

## 2018-12-24 MED ORDER — MELATONIN 3 MG PO TABS
3.0000 mg | ORAL_TABLET | Freq: Every day | ORAL | Status: DC
  Administered 2018-12-24: 3 mg via ORAL
  Filled 2018-12-24 (×2): qty 1

## 2018-12-24 MED ORDER — UMECLIDINIUM BROMIDE 62.5 MCG/INH IN AEPB
1.0000 | INHALATION_SPRAY | Freq: Two times a day (BID) | RESPIRATORY_TRACT | Status: DC
  Administered 2018-12-24: 1 via RESPIRATORY_TRACT
  Filled 2018-12-24: qty 7

## 2018-12-24 MED ORDER — FUROSEMIDE 40 MG PO TABS
60.0000 mg | ORAL_TABLET | Freq: Every day | ORAL | Status: DC
  Administered 2018-12-24 – 2018-12-25 (×2): 60 mg via ORAL
  Filled 2018-12-24 (×2): qty 1

## 2018-12-24 MED ORDER — OXYCODONE HCL 5 MG PO TABS
10.0000 mg | ORAL_TABLET | Freq: Four times a day (QID) | ORAL | Status: DC | PRN
  Administered 2018-12-24 (×2): 10 mg via ORAL
  Filled 2018-12-24 (×2): qty 2

## 2018-12-24 MED ORDER — ASPIRIN EC 81 MG PO TBEC
81.0000 mg | DELAYED_RELEASE_TABLET | Freq: Every day | ORAL | Status: DC
  Administered 2018-12-24 – 2018-12-25 (×2): 81 mg via ORAL
  Filled 2018-12-24 (×2): qty 1

## 2018-12-24 MED ORDER — CITALOPRAM HYDROBROMIDE 20 MG PO TABS
30.0000 mg | ORAL_TABLET | Freq: Every day | ORAL | Status: DC
  Administered 2018-12-24 (×2): 30 mg via ORAL
  Filled 2018-12-24: qty 3
  Filled 2018-12-24: qty 1

## 2018-12-24 MED ORDER — SODIUM CHLORIDE 0.9% FLUSH
3.0000 mL | Freq: Two times a day (BID) | INTRAVENOUS | Status: DC
  Administered 2018-12-24 – 2018-12-25 (×2): 3 mL via INTRAVENOUS

## 2018-12-24 MED ORDER — POTASSIUM CHLORIDE CRYS ER 20 MEQ PO TBCR
20.0000 meq | EXTENDED_RELEASE_TABLET | Freq: Every evening | ORAL | Status: DC
  Administered 2018-12-24: 20 meq via ORAL
  Filled 2018-12-24: qty 1

## 2018-12-24 MED ORDER — PANTOPRAZOLE SODIUM 40 MG PO TBEC
40.0000 mg | DELAYED_RELEASE_TABLET | Freq: Every day | ORAL | Status: DC
  Administered 2018-12-24 – 2018-12-25 (×2): 40 mg via ORAL
  Filled 2018-12-24 (×2): qty 1

## 2018-12-24 NOTE — Plan of Care (Signed)

## 2018-12-24 NOTE — Progress Notes (Addendum)
[facility] PROGRESS NOTE  [name] ZJI:[id number] DOB: [birth year] DOA: 12/23/2018 PCP: [name], MD  Assessment/Plan:   Acute on chronic diastolic heart failure. Patient presented with progressively worsening of lower extremity swelling and shortness of breath.  BNP only seem mildly elevated at 102.8.  Chest x-ray showing signs of COPD and cardiomegaly without edema.  Patient was given 80 mg of Lasix IV.  Last echocardiogram noted EF of 60 to 65% with grade 1 diastolic dysfunction. weight 155 which is less than recent discharge. Recently advised to change spironolactone to every other day due to creatinine of 1.4.  -will resume po lasix at 60mg  daily - Strict I&Os and daily weights - Elevate legs -TED hose - Continue spironolactone daily  Cellulitis of the bilateral lower extremities: acute on chronic. Patient reports hx of same. States at baseline bilateral ankles with mild "pinkness, no heat and little swelling".  Patient presented with increasing erythema and warmth to the bilateral lower extremities. CRP and sed rate within limits of normal.  She is afebrile no leukocytosis, hemodynamically stable. -  continue Ceftriaxone IV   Suspected otitis media: Acute.  Patient noted to have erythema of the right ear and tenderness of the right jaw.  - Antibiotics as seen above  Chest pain, history of CAD:  2 days ago. Resolved. No further discomfort. Troponin slightly elevated but flat. No events on tele - Nitroglycerin as needed - Continue aspirin  Dyspnea, COPD: xray with no acute finding and copd. Faint wheeze on exam.  -change nebs to schedule -monitor oxygen sat level  Hypothyroidism: Last TSH noted to be 8.6 on 11/22. Repeat this admission 5.7.  -will leave dose at current level. - Recommend recheck 4 weeks - Continue levothyroxine  History of atrial fibrillation: Patient not on anticoagulation due to history of GI bleeds.  -continue home meds  Chronic  kidney disease stage III: Creatinine stable. - Continue to monitor and adjust diuresis as needed    Code Status: full Family Communication: daughter at bedside Disposition Plan: home when ready   Consultants:  none  Procedures:  none  Antibiotics:  Rocephin 12/23/18>>  HPI/Subjective: Presents with sob and DOE as well as bilateral LE swelling/erythem and right knee pain and right jaw pain. Admitted for acute on chronic heart failure, cellulitis, otitis media.   Reports improved breathing but right knee hurts  Objective: Vitals:   12/24/18 1400 12/24/18 1427  BP: (!) 121/50 126/74  Pulse: 76 80  Resp: 19 19  Temp:  97.8 F (36.6 C)  SpO2: 92% 97%    Intake/Output Summary (Last 24 hours) at 12/24/2018 1429 Last data filed at 12/24/2018 0901 Gross per 24 hour  Intake -  Output 200 ml  Net -200 ml   Filed Weights   12/24/18 0911 12/24/18 1402  Weight: 70.5 kg 74.9 kg    Exam:   General:  Sitting up in bed visiting with family, no acute distress  Cardiovascular: rrr no mgr trace LE edema. Mild erythema bilateral ankle to midshin, hot to touch, tender to touch  Respiratory: normal effort BS distant with faint end expiratory wheeze, no crackles no  rhonchi  Abdomen: soft +BS no guarding or rebounding  Musculoskeletal: right knee without swelling erythema. Mild tenderness full rom. Other joints without swelling/erythema   Data Reviewed: Basic Metabolic Panel: Recent Labs  Lab 12/23/18 1202 12/23/18 1315 12/24/18 0734  NA 140 140 138  K 4.2 4.0 4.4  CL 100 102 102  CO2 25  30 25  GLUCOSE 100* 99 104*  BUN 21 18 16   CREATININE 1.06* 1.07* 0.95  CALCIUM 9.5 9.2 9.3   Liver Function Tests: Recent Labs  Lab 12/24/18 0734  AST 15  ALT 10  ALKPHOS 82  BILITOT 0.9  PROT 6.8  ALBUMIN 3.4*   No results for input(s): LIPASE, AMYLASE in the last 168 hours. No results for input(s): AMMONIA in the last 168 hours. CBC: Recent Labs  Lab 12/23/18 1315  12/24/18 0330  WBC 8.3 7.4  HGB 11.7* 11.8*  HCT 38.6 38.1  MCV 91.9 89.6  PLT 260 249   Cardiac Enzymes: Recent Labs  Lab 12/24/18 0330 12/24/18 0734 12/24/18 1153  TROPONINI <0.03 <0.03 <0.03   BNP (last 3 results) Recent Labs    04/01/18 1541 10/06/18 2014 12/23/18 1653  BNP 194.0* 124.2* 102.8*    ProBNP (last 3 results) Recent Labs    09/10/18 1302 10/05/18 0910 11/01/18 1111  PROBNP 417 1,048* 601    CBG: No results for input(s): GLUCAP in the last 168 hours.  No results found for this or any previous visit (from the past 240 hour(s)).   Studies: Dg Chest 2 View  Result Date: 12/23/2018 CLINICAL DATA:  Shortness of breath EXAM: CHEST - 2 VIEW COMPARISON:  10/07/2018 FINDINGS: Interstitial coarsening and hyperinflation, chronic. Chronic cardiomegaly and aortic tortuosity. Status post CABG. There is no edema, consolidation, effusion, or pneumothorax. Biapical pleural based scarring. Breast implants, lumbar spine hardware, and right reverse glenohumeral arthroplasty. IMPRESSION: 1. No acute finding. 2. COPD. Electronically Signed   By: Monte Fantasia M.D.   On: 12/23/2018 14:03   Dg Knee Complete 4 Views Left  Result Date: 12/23/2018 CLINICAL DATA:  Pt reports bilateral knee pain for 4 days along with right sided jaw pain that radiates to her neck. Pt also reports worsening shortness of breath with exertion. Pt denies injury. Pt has hx of bilateral knee replacements. EXAM: LEFT KNEE - COMPLETE 4+ VIEW COMPARISON:  05/01/2009 FINDINGS: No fracture.  No bone lesion. Knee prosthetic components appear well seated and well aligned. No evidence of loosening. Skeletal structures are demineralized. Minimal joint effusion suggested. Soft tissues are unremarkable. IMPRESSION: 1. No fracture or bone lesion. 2. No evidence of loosening of the orthopedic hardware. 3. Probable minimal joint effusion. Electronically Signed   By: Lajean Manes M.D.   On: 12/23/2018 18:23   Dg Knee  Complete 4 Views Right  Result Date: 12/23/2018 CLINICAL DATA:  Pt reports bilateral knee pain for 4 days along with right sided jaw pain that radiates to her neck. Pt also reports worsening shortness of breath with exertion. Pt denies injury. Pt has hx of bilateral knee replacements. EXAM: RIGHT KNEE - COMPLETE 4+ VIEW COMPARISON:  None. FINDINGS: No fracture or bone lesion.  Bones are demineralized. Knee prosthetic components appear well seated and aligned. No evidence of loosening. Probable minimal joint effusion. IMPRESSION: 1. No fracture or bone lesion. 2. Knee prosthetic hardware appears well seated and aligned. No evidence of loosening. 3. Probable minimal joint effusion. Electronically Signed   By: Lajean Manes M.D.   On: 12/23/2018 18:24    Scheduled Meds: . aspirin EC  81 mg Oral Daily  . atorvastatin  20 mg Oral QPM  . citalopram  30 mg Oral QHS  . enoxaparin (LOVENOX) injection  40 mg Subcutaneous Daily  . furosemide  60 mg Oral Daily  . ipratropium-albuterol  3 mL Nebulization Q4H  . levothyroxine  37.5 mcg Oral  QAC breakfast  . magnesium oxide  400 mg Oral BID  . Melatonin  3 mg Oral QHS  . pantoprazole  40 mg Oral Daily  . pilocarpine  1 drop Both Eyes BID  . potassium chloride  20 mEq Oral QPM  . pramipexole  0.25 mg Oral QHS  . roflumilast  500 mcg Oral QPM  . sodium chloride flush  3 mL Intravenous Q12H  . spironolactone  25 mg Oral QPM  . traZODone  50 mg Oral QHS   Continuous Infusions: . cefTRIAXone (ROCEPHIN)  IV Stopped (12/24/18 0433)    Principal Problem:   Acute on chronic diastolic heart failure (HCC) Active Problems:   COPD (chronic obstructive pulmonary disease) (HCC)   Cellulitis   Hypothyroidism   CAD (coronary artery disease)   Pulmonary embolism (HCC)   Otitis media    Time spent: 78 min    Humble NP  [facility]  If 7PM-7AM, please contact night-coverage at [email or web address], password [facility] 12/24/2018, 2:29 PM  LOS: 0 days     Patient was seen, examined,treatment plan was discussed with the Advance Practice Provider.  I have personally reviewed the clinical findings, labs, EKG, imaging studies and management of this patient in detail. I have also reviewed the orders written for this patient which were under my direction. I agree with the documentation, as recorded by the Advance Practice Provider.   [name] is a 78 y.o. female here with multiple complaints including SOB, lower extremity redness.  Recent changes in diuretic dose-- initially up and then Cr was found to be elevated and aldactone was changed to every other day.  Was given IV lasix x 1 and will resume home dose.  Plan to elevated extremities and continue IV abx-- although b/l cellulitis is less likely.  Geradine Girt, DO

## 2018-12-24 NOTE — Progress Notes (Signed)
SATURATION QUALIFICATIONS: (This note is used to comply with regulatory documentation for home oxygen)  Patient Saturations on Room Air at Rest = 98%  Patient Saturations on Room Air while Ambulating = 97%  Please briefly explain why patient needs home oxygen: Patient does not require oxygen while ambulating.

## 2018-12-24 NOTE — ED Notes (Signed)
Breakfast Tray ordered  

## 2018-12-24 NOTE — Consult Note (Addendum)
Cardiology Consultation:   Patient ID: [name] MRN: [id number]; DOB: 27-Mar-1941  Admit date: 12/23/2018 Date of Consult: 12/24/2018  Primary Care Provider: [name], MD Primary Cardiologist: Dorris Carnes, MD  Primary Electrophysiologist:  None    Patient Profile:   [name] is a 78 y.o. female with a hx of chronic diastolic heart failure who is being seen today for the evaluation of shortness of breath, leg swelling at the request of Dr. Eliseo Squires.  History of Present Illness:   [name] has had a history of coronary artery disease, atrial fibrillation and GI bleeding.  She has had multiple other medical issues.  In 2018, she had a prolonged hospitalization after a GI bleed.  In December 2019, she was seen in our office.  She had been having complaints of leg swelling and fatigue.  She did limit salt in her diet.  She had cut back on drinking tomato juice.  She had tried to limit her fluid intake as well.  Symptoms did not change all that much.  Yesterday, she came to the emergency room due to worsening swelling in both legs and knee pain.  She also reported worsening shortness of breath.  Work-up did not show a significantly elevated BNP.  Chest x-ray did not show any volume overload.  Her weights at home have been stable.  She was found to have areas of redness on both legs concerning for cellulitis.  She was also found to have an elevated TSH.  She reported some chest pain after she exerted while preparing for the Christmas holiday.  She used nitroglycerin once and had relief of her symptoms.  No recurrence of her symptoms since that time.    She received a dose of IV Lasix yesterday.  She feels that her breathing is better.  No further trouble lying flat at night.      Past Medical History:  Diagnosis Date  . Anemia    bld. transfusion post lumbar surgery- 2012  . Anxiety   . Arthralgia    NOS  . Blood transfusion 2012; 02/2018   WITH BACK SURGERY; "related to  hematoma in stomach" (10/06/2018)  . CAD (coronary artery disease)    s/p CABG 2004; s/p DES to LM in 2010;  [place] 10/29/11: EF 50-55%, mild elevated filling pressures, no pulmonary HTN, LM 90% ISR, LAD and CFX occluded, S-RI occluded (old), S-OM3 ok and L-LAD ok, native nondominant RCA 95% -  med rx recommended ; Lexiscan Myoview 7/13 at [facility]: demonstrated "normal LV function, anterior attenuation and localized ischemia, inferior, basilar, mid section"  . Carotid artery disease ([place])    Carotid US 8/52:  RICA 7-78; LICA 24-23; R subclavian stenosis - Repeat 1 year.  . CHF (congestive heart failure) ([place])   . Chronic diastolic heart failure (HCC)    Echo 9/10: EF 53-61%, grade 1 diastolic dysfunction  . Chronic lower back pain   . COPD (chronic obstructive pulmonary disease) ([place])    Emphysema dxed by Dr. Woody Seller in [place] based on PFTs per pt in 2006; placed on albuterol  . Depression    TAKES CELEXA  AND  (OFF- WELBUTRIN)  . Diuretic-induced hypokalemia 10/08/2018  . DVT of lower extremity (deep venous thrombosis) (HCC)    recurrent. bilateral (2 episodes)  . Dyspnea   . Dysrhythmia    afib with cabg  . Exertional angina (HCC)    Treated with Isosorbide, Ranexa, amlodipine; intolerant to metoprolol  . GERD (gastroesophageal reflux  disease)   . Gout    "on daily RX" (10/06/2018)  . HLD (hyperlipidemia)   . Hypertension   . Hyperthyroidism   . Obesity (BMI 30-39.9) 2009   BMI 33  . Osteoarthritis    "all over; hands" (10/06/2018)  . Oxygen deficiency   . Oxygen dependent    4L at home as needed (10/06/2018)  . Pneumonia   . PVD (peripheral vascular disease) ([place])   . Sleep apnea 2012   USED CPAP THEN  STARTED USING SPIRIVA , Nov. 2013- last evaluation , changed from mask to aparatus that is just for her nose.. ; reports 05-28-18 "the mask smothers me so i dont use it right now" (10/06/2018)    Past Surgical History:  Procedure Laterality Date  . ANKLE SURGERY  2004  .  ANTERIOR CERVICAL DECOMP/DISCECTOMY FUSION  11/2007  . AUGMENTATION MAMMAPLASTY    . [place]   lower; another scheduled, opt. reports 4 back- lumbar, 3 cerv. fusions  for later 2009  . CARDIAC CATHETERIZATION  11/2009   Patent LIMA to LAD and patent SVG to OM1. Occluded SVG to ramus and diagonal. Left main: 90% ostial stenosis, LCX 60-70% proximal stenosis  . CATARACT EXTRACTION W/ INTRAOCULAR LENS  IMPLANT, BILATERAL Bilateral 2006  . COLONOSCOPY WITH PROPOFOL N/A 02/17/2017   Procedure: COLONOSCOPY WITH PROPOFOL;  Surgeon: Jerene Bears, MD;  Location: [facility] ENDOSCOPY;  Service: Endoscopy;  Laterality: N/A;  . CORONARY ANGIOPLASTY WITH STENT PLACEMENT  11/2009   Drug eluting stent to left main artery: 4.0 X 12 mm Ion   . CORONARY ARTERY BYPASS GRAFT  2004   "CABG X4"  . ESOPHAGOGASTRODUODENOSCOPY N/A 02/14/2017   Procedure: ESOPHAGOGASTRODUODENOSCOPY (EGD);  Surgeon: Doran Stabler, MD;  Location: [facility] ENDOSCOPY;  Service: Endoscopy;  Laterality: N/A;  . GIVENS CAPSULE STUDY N/A 02/15/2017   Procedure: GIVENS CAPSULE STUDY;  Surgeon: Doran Stabler, MD;  Location: [facility];  Service: Endoscopy;  Laterality: N/A;  . GROIN MASS OPEN BIOPSY  2004  . IR IVC FILTER PLMT / S&I /IMG GUID/MOD SED  03/14/2018  . IR PARACENTESIS  03/10/2018  . JOINT REPLACEMENT    . LAPAROSCOPIC CHOLECYSTECTOMY    . PERIPHERAL VASCULAR INTERVENTION Left 03/05/2018   Procedure: PERIPHERAL VASCULAR INTERVENTION;  Surgeon: Elam Dutch, MD;  Location: [facility];  Service: Cardiovascular;  Laterality: Left;  Attempted unsuccess\ful Per Dr. Eden Lathe  . PERIPHERAL VASCULAR INTERVENTION  03/08/2018   Procedure: PERIPHERAL VASCULAR INTERVENTION;  Surgeon: Waynetta Sandy, MD;  Location: [place] [facility];  Service: Cardiovascular;;  SMA Stent   . REPLACEMENT TOTAL KNEE BILATERAL Bilateral 2012  . REVERSE SHOULDER ARTHROPLASTY  02/26/2012   Procedure: REVERSE SHOULDER ARTHROPLASTY;  Surgeon:  Marin Shutter, MD;  Location: [facility];  Service: Orthopedics;  Laterality: Right;  RIGHT SHOULDER REVERSED ARTHROPLASTY  . SHOULDER ARTHROSCOPY WITH SUBACROMIAL DECOMPRESSION Left 02/10/2013   Procedure: SHOULDER ARTHROSCOPY WITH SUBACROMIAL DECOMPRESSION DISTAL CLAVICLE RESECTION;  Surgeon: Marin Shutter, MD;  Location: [facility];  Service: Orthopedics;  Laterality: Left;  DISTAL CLAVICLE RESECTION  . TONSILLECTOMY    . TOTAL HIP ARTHROPLASTY  2007   Right  . TRANSURETHRAL RESECTION OF BLADDER TUMOR N/A 05/31/2018   Procedure: TRANSURETHRAL RESECTION OF BLADDER TUMOR (TURBT);  Surgeon: Lucas Mallow, MD;  Location: [facility];  Service: Urology;  Laterality: N/A;  . VISCERAL ANGIOGRAPHY N/A 03/05/2018   Procedure: VISCERAL ANGIOGRAPHY;  Surgeon: Elam Dutch, MD;  Location: [facility];  Service: Cardiovascular;  Laterality: N/A;  . VISCERAL ANGIOGRAPHY N/A 03/08/2018   Procedure: VISCERAL ANGIOGRAPHY;  Surgeon: Waynetta Sandy, MD;  Location: [facility];  Service: Cardiovascular;  Laterality: N/A;       Inpatient Medications: Scheduled Meds: . aspirin EC  81 mg Oral Daily  . atorvastatin  20 mg Oral QPM  . citalopram  30 mg Oral QHS  . enoxaparin (LOVENOX) injection  40 mg Subcutaneous Daily  . furosemide  60 mg Oral Daily  . ipratropium-albuterol  3 mL Nebulization Q4H  . levothyroxine  37.5 mcg Oral QAC breakfast  . magnesium oxide  400 mg Oral BID  . Melatonin  3 mg Oral QHS  . pantoprazole  40 mg Oral Daily  . pilocarpine  1 drop Both Eyes BID  . potassium chloride  20 mEq Oral QPM  . pramipexole  0.25 mg Oral QHS  . roflumilast  500 mcg Oral QPM  . sodium chloride flush  3 mL Intravenous Q12H  . spironolactone  25 mg Oral QPM  . traZODone  50 mg Oral QHS   Continuous Infusions: . cefTRIAXone (ROCEPHIN)  IV Stopped (12/24/18 0433)   PRN Meds: acetaminophen **OR** acetaminophen, cyclobenzaprine, nitroGLYCERIN, oxyCODONE  Allergies:    Allergies    Allergen Reactions  . Ativan [Lorazepam] Other (See Comments)    Confusion   . Pitavastatin Other (See Comments)    LIVALO: reaction not recalled  . Ropinirole Nausea Only and Other (See Comments)    Makes legs jump, also  . Zofran [Ondansetron Hcl] Nausea Only  . Zolpidem Tartrate Anxiety and Other (See Comments)    CONFUSION   . Penicillins Rash and Other (See Comments)    Tolerated cephalosporins in past Has patient had a PCN reaction causing immediate rash, facial/tongue/throat swelling, SOB or lightheadedness with hypotension: Yes Has patient had a PCN reaction causing severe rash involving mucus membranes or skin necrosis: No Has patient had a PCN reaction that required hospitalization: No Has patient had a PCN reaction occurring within the last 10 years: No If all of the above answers are "NO", then may proceed with Cephalosporin use.     Social History:   Social History   Socioeconomic History  . Marital status: Widowed    Spouse name: Not on file  . Number of children: 3  . Years of education: Not on file  . Highest education level: Not on file  Occupational History  . Occupation: retired    Comment: Electronics engineer: RETIRED  Social Needs  . Financial resource strain: Not on file  . Food insecurity:    Worry: Not on file    Inability: Not on file  . Transportation needs:    Medical: Not on file    Non-medical: Not on file  Tobacco Use  . Smoking status: Former Smoker    Packs/day: 1.00    Years: 45.00    Pack years: 45.00    Types: Cigarettes    Last attempt to quit: 12/22/2000    Years since quitting: 18.0  . Smokeless tobacco: Never Used  Substance and Sexual Activity  . Alcohol use: Never    Alcohol/week: 0.0 standard drinks    Frequency: Never  . Drug use: Never  . Sexual activity: Not Currently  Lifestyle  . Physical activity:    Days per week: Not on file    Minutes per session: Not on file  . Stress: Not  on file  Relationships  .  Social connections:    Talks on phone: Not on file    Gets together: Not on file    Attends religious service: Not on file    Active member of club or organization: Not on file    Attends meetings of clubs or organizations: Not on file    Relationship status: Not on file  . Intimate partner violence:    Fear of current or ex partner: Not on file    Emotionally abused: Not on file    Physically abused: Not on file    Forced sexual activity: Not on file  Other Topics Concern  . Not on file  Social History Narrative      3 grown children.    Family History:    Family History  Problem Relation Age of Onset  . Ovarian cancer Mother   . Cancer Mother   . Lung cancer Father   . COPD Father   . Heart disease Father   . Cancer Other   . Heart disease Other   . Colitis Other   . Heart disease Sister   . Cancer Sister      ROS:  Please see the history of present illness.   All other ROS reviewed and negative.     Physical Exam/Data:   Vitals:   12/24/18 1200 12/24/18 1400 12/24/18 1402 12/24/18 1427  BP: (!) 112/58 (!) 121/50  126/74  Pulse: 71 76  80  Resp: 20 19  19   Temp:    97.8 F (36.6 C)  TempSrc:    Oral  SpO2: 95% 92%  97%  Weight:   74.9 kg   Height:   5\' 8"  (1.727 m)     Intake/Output Summary (Last 24 hours) at 12/24/2018 1522 Last data filed at 12/24/2018 1430 Gross per 24 hour  Intake 93.18 ml  Output 200 ml  Net -106.82 ml   Filed Weights   12/24/18 0911 12/24/18 1402  Weight: 70.5 kg 74.9 kg   Body mass index is 25.1 kg/m.  General:  Well nourished, well developed, in no acute distress HEENT: normal Lymph: no adenopathy Neck: no JVD Endocrine:  No thryomegaly Vascular: Bilateral soft carotid bruits  Cardiac:  normal S1, S2; RRR; 2 out of 6 early systolic murmur  Lungs:  clear to auscultation bilaterally, no wheezing, rhonchi or rales  Abd: soft, nontender, no hepatomegaly  Ext: bilateral mid shin edema Musculoskeletal:  No  deformities, Skin: warm and dry ; redness on both legs Neuro:  CNs 2-12 intact, no focal abnormalities noted Psych:  Normal affect   EKG:  The EKG was personally reviewed and demonstrates: Sinus rhythm, prolonged PR interval Telemetry:  Telemetry was personally reviewed and demonstrates: Sinus rhythm  Relevant CV Studies: Echo from November 2019 reviewed showing normal LV function, diastolic dysfunction, calcified aortic valve  Laboratory Data:  Chemistry Recent Labs  Lab 12/23/18 1202 12/23/18 1315 12/24/18 0734  NA 140 140 138  K 4.2 4.0 4.4  CL 100 102 102  CO2 25 30 25   GLUCOSE 100* 99 104*  BUN 21 18 16   CREATININE 1.06* 1.07* 0.95  CALCIUM 9.5 9.2 9.3  GFRNONAA 50* 50* 57*  GFRAA 58* 58* >60  ANIONGAP  --  8 11    Recent Labs  Lab 12/24/18 0734  PROT 6.8  ALBUMIN 3.4*  AST 15  ALT 10  ALKPHOS 82  BILITOT 0.9   Hematology Recent Labs  Lab 12/23/18 1315  12/24/18 0330  WBC 8.3 7.4  RBC 4.20 4.25  HGB 11.7* 11.8*  HCT 38.6 38.1  MCV 91.9 89.6  MCH 27.9 27.8  MCHC 30.3 31.0  RDW 14.5 14.5  PLT 260 249   Cardiac Enzymes Recent Labs  Lab 12/24/18 0330 12/24/18 0734 12/24/18 1153  TROPONINI <0.03 <0.03 <0.03    Recent Labs  Lab 12/23/18 1331  TROPIPOC 0.00    BNP Recent Labs  Lab 12/23/18 1653  BNP 102.8*    DDimer No results for input(s): DDIMER in the last 168 hours.  Radiology/Studies:  Dg Chest 2 View  Result Date: 12/23/2018 CLINICAL DATA:  Shortness of breath EXAM: CHEST - 2 VIEW COMPARISON:  10/07/2018 FINDINGS: Interstitial coarsening and hyperinflation, chronic. Chronic cardiomegaly and aortic tortuosity. Status post CABG. There is no edema, consolidation, effusion, or pneumothorax. Biapical pleural based scarring. Breast implants, lumbar spine hardware, and right reverse glenohumeral arthroplasty. IMPRESSION: 1. No acute finding. 2. COPD. Electronically Signed   By: Monte Fantasia M.D.   On: 12/23/2018 14:03   Dg Knee Complete  4 Views Left  Result Date: 12/23/2018 CLINICAL DATA:  Pt reports bilateral knee pain for 4 days along with right sided jaw pain that radiates to her neck. Pt also reports worsening shortness of breath with exertion. Pt denies injury. Pt has hx of bilateral knee replacements. EXAM: LEFT KNEE - COMPLETE 4+ VIEW COMPARISON:  05/01/2009 FINDINGS: No fracture.  No bone lesion. Knee prosthetic components appear well seated and well aligned. No evidence of loosening. Skeletal structures are demineralized. Minimal joint effusion suggested. Soft tissues are unremarkable. IMPRESSION: 1. No fracture or bone lesion. 2. No evidence of loosening of the orthopedic hardware. 3. Probable minimal joint effusion. Electronically Signed   By: Lajean Manes M.D.   On: 12/23/2018 18:23   Dg Knee Complete 4 Views Right  Result Date: 12/23/2018 CLINICAL DATA:  Pt reports bilateral knee pain for 4 days along with right sided jaw pain that radiates to her neck. Pt also reports worsening shortness of breath with exertion. Pt denies injury. Pt has hx of bilateral knee replacements. EXAM: RIGHT KNEE - COMPLETE 4+ VIEW COMPARISON:  None. FINDINGS: No fracture or bone lesion.  Bones are demineralized. Knee prosthetic components appear well seated and aligned. No evidence of loosening. Probable minimal joint effusion. IMPRESSION: 1. No fracture or bone lesion. 2. Knee prosthetic hardware appears well seated and aligned. No evidence of loosening. 3. Probable minimal joint effusion. Electronically Signed   By: Lajean Manes M.D.   On: 12/23/2018 18:24    Assessment and Plan:   1. Chronic diastolic heart failure: She does feel better after dose of Lasix.  She does not appear to be volume overloaded at this time. BNP was essentially normal.  CXR did not show volume overload.  I agree with going back to her home dose of oral Lasix.  Kidney function is stable. 2. Her leg swelling, fatigue and shortness of breath may be multifactorial.  She  feels that she has not bounced back since her prolonged hospitalization last year.  She also has an elevated TSH.  She may have cellulitis as well in her legs. 3. CAD/chest pain: Atypical in some regards, and that the pain did not occur while she was active.  It occurred after she stopped being active.  No recurrence of chest pain.  Troponins have been negative.  No ischemic testing planned at this time. 4. Continue routine lifestyle changes for heart failure including low-salt diet.  5. Murmur: She may have a degree of aortic sclerosis based on exam, although it was not reported to be significant in 11/19.     I personally reviewed the echo images from November 2019 and the aortic valve appeared to move freely. 6. Carotid artery disease: She has routine carotid Dopplers.      For questions or updates, please contact [place] Please consult [email or web address] for contact info under     Signed, Larae Grooms, MD  12/24/2018 3:22 PM

## 2018-12-24 NOTE — ED Notes (Signed)
Patient ate breakfast. No complaints at this time.

## 2018-12-24 NOTE — ED Notes (Signed)
Patient placed on 2L for comfort.

## 2018-12-25 DIAGNOSIS — I5033 Acute on chronic diastolic (congestive) heart failure: Secondary | ICD-10-CM | POA: Diagnosis not present

## 2018-12-25 DIAGNOSIS — R0609 Other forms of dyspnea: Secondary | ICD-10-CM | POA: Diagnosis not present

## 2018-12-25 DIAGNOSIS — L03119 Cellulitis of unspecified part of limb: Secondary | ICD-10-CM | POA: Diagnosis not present

## 2018-12-25 DIAGNOSIS — J449 Chronic obstructive pulmonary disease, unspecified: Secondary | ICD-10-CM | POA: Diagnosis not present

## 2018-12-25 DIAGNOSIS — R600 Localized edema: Secondary | ICD-10-CM | POA: Diagnosis not present

## 2018-12-25 LAB — BASIC METABOLIC PANEL
Anion gap: 8 (ref 5–15)
BUN: 19 mg/dL (ref 8–23)
CO2: 28 mmol/L (ref 22–32)
Calcium: 9.1 mg/dL (ref 8.9–10.3)
Chloride: 102 mmol/L (ref 98–111)
Creatinine, Ser: 1.11 mg/dL — ABNORMAL HIGH (ref 0.44–1.00)
GFR calc Af Amer: 55 mL/min — ABNORMAL LOW (ref >60)
GFR calc non Af Amer: 48 mL/min — ABNORMAL LOW (ref >60)
Glucose, Bld: 85 mg/dL (ref 70–99)
Potassium: 4.3 mmol/L (ref 3.5–5.1)
Sodium: 138 mmol/L (ref 135–145)

## 2018-12-25 MED ORDER — CEPHALEXIN 500 MG PO CAPS: 500.0000 mg | ORAL_CAPSULE | Freq: Two times a day (BID) | ORAL | 0 refills | Status: AC

## 2018-12-25 NOTE — Progress Notes (Signed)
Assisted pt in ordering breakfast  

## 2018-12-25 NOTE — Progress Notes (Signed)
Pt IV and telemetry removed by NT. Discharge education went over at bedside with pt and pt daughter. Pt discahrged via wheelchair with NT. Pt has all belongings.

## 2018-12-25 NOTE — Discharge Summary (Signed)
Physician Discharge Summary  [name] DZH:[id number] DOB: Dec 01, 1941 DOA: 12/23/2018  PCP: [name], MD  Admit date: 12/23/2018 Discharge date: 12/25/2018  Time spent: 45 minutes  Recommendations for Outpatient Follow-up:  1. Follow up with cards as scheduled. Keep journal of daily weights and take data to cardiology appointment 2. Follow up with PCP 3-4 weeks for evaluation of thyroid function and resolution of ear infection and cellulitis and kidney function. Consider higher threshold in creatinine before adjusting diuretics 3. TED hose   Discharge Diagnoses:  Principal Problem:   Acute on chronic diastolic heart failure (HCC) Active Problems:   COPD (chronic obstructive pulmonary disease) (HCC)   Cellulitis   Hypothyroidism   CAD (coronary artery disease)   Pulmonary embolism ([place])   Otitis media   Discharge Condition: stable  Diet recommendation: heart healthy  Filed Weights   12/24/18 0911 12/24/18 1402 12/25/18 0441  Weight: 70.5 kg 74.9 kg 74.5 kg    History of present illness:  Presented 12/23/18 with sob and DOE as well as bilateral LE swelling/erythem and right knee pain and right jaw pain. Admitted for acute on chronic heart failure, cellulitis, otitis media.   Feels much better. Would like to go home  Hospital Course:  Acute on chronic diastolic heart failure.Patient presented with progressively worsening of lower extremity swelling and shortness of breath. BNP only seem mildly elevated at 102.8. Chest x-ray showing signs of COPD and cardiomegaly without edema. Patient was given 80 mg of Lasix IV. Last echocardiogram noted EF of 60 to 65% with grade 1 diastolic dysfunction. weight 155 which is less than recent discharge. Recently advised to change spironolactone to every other day due to creatinine of 1.4. evaluated by cardiology who opined not overloaded and recommended resuming oral lasix and limiting sodium.  Cellulitis of the bilateral lower  extremities: acute on chronic. Patient reports hx of same. States at baseline bilateral ankles with mild "pinkness, no heat and little swelling". Patient presented with increasing erythema and warmth to the bilateral lower extremities. CRP and sed rate within limits of normal.  She is afebrile no leukocytosis, hemodynamically stable. Received 2 days of rocephin. On day of discharge less erythema and decreased heat and swelling r>l. Will discharge on keflex 3 more days  Suspected otitis media: Acute. Patient noted to have erythema of the right ear and tenderness of the right jaw. Antibiotics as seen above  Chest pain,history of CAD:  2 days ago. Resolved. No further discomfort. Troponin slightly elevated but flat. No events on tele. Evaluated by cardiology who opine atypical in some regards and recommended no further ischemic work up  Dyspnea,COPD: xray with no acute finding and copd. Faint wheeze on exam. ocygen saturation level greater than 90% on room air with ambulation.   Hypothyroidism:Last TSH noted to be 8.6 on 11/22. Repeat this admission 5.7.  Left synthroid at current dose as TSH trending down. Recommend recheck 4 weeks  History of atrial fibrillation: Patient not on anticoagulation due to history of GI bleeds.   Chronic kidney disease stage III: Creatinine trending down from 1.4. stable at discharge. Consider higher threshold in creatinine before adjusting diuretic.    Procedures:  Consultations:  [facility]  Discharge Exam: Vitals:   12/25/18 0441 12/25/18 0911  BP: (!) 134/47 105/71  Pulse: 72 70  Resp: 18   Temp: 98.2 F (36.8 C)   SpO2: 99%     General: sitting on side of bed in no acute distress smiling Cardiovascular: rrr  no mgr trace LE edema. Less erythma bilateral ankles Respiratory: normal effort BS clear but distant no wheeze crackles  Discharge Instructions   Discharge Instructions    Call MD for:  difficulty breathing, headache or  visual disturbances   Complete by:  As directed    Diet - low sodium heart healthy   Complete by:  As directed    Discharge instructions   Complete by:  As directed    Limit sodium intake as instructed Follow up with cardiology as scheduled Complete antibiotics as prescribed   Heart Failure patients record your daily weight using the same scale at the same time of day   Complete by:  As directed    Increase activity slowly   Complete by:  As directed    STOP any activity that causes chest pain, shortness of breath, dizziness, sweating, or exessive weakness   Complete by:  As directed      Allergies as of 12/25/2018      Reactions   Ativan [lorazepam] Other (See Comments)   Confusion    Pitavastatin Other (See Comments)   LIVALO: reaction not recalled   Ropinirole Nausea Only, Other (See Comments)   Makes legs jump, also   Zofran [ondansetron Hcl] Nausea Only   Zolpidem Tartrate Anxiety, Other (See Comments)   CONFUSION    Penicillins Rash, Other (See Comments)   Tolerated cephalosporins in past Has patient had a PCN reaction causing immediate rash, facial/tongue/throat swelling, SOB or lightheadedness with hypotension: Yes Has patient had a PCN reaction causing severe rash involving mucus membranes or skin necrosis: No Has patient had a PCN reaction that required hospitalization: No Has patient had a PCN reaction occurring within the last 10 years: No If all of the above answers are "NO", then may proceed with Cephalosporin use.      Medication List    TAKE these medications   allopurinol 100 MG tablet Commonly known as:  ZYLOPRIM Take 100 mg by mouth daily.   aspirin EC 81 MG tablet Take 81 mg by mouth daily.   atorvastatin 20 MG tablet Commonly known as:  LIPITOR Take 1 tablet (20 mg total) by mouth daily. What changed:  when to take this   BC HEADACHE POWDER PO Take 1 packet by mouth as needed (for headaches).   cephALEXin 500 MG capsule Commonly known as:   KEFLEX Take 1 capsule (500 mg total) by mouth 2 (two) times daily for 3 days.   citalopram 20 MG tablet Commonly known as:  CELEXA Take 30 mg by mouth at bedtime.   [employer] D3 25 MCG (1000 UT) capsule Generic drug:  Cholecalciferol Take 1,000 Units by mouth daily.   cyclobenzaprine 10 MG tablet Commonly known as:  FLEXERIL Take 10 mg by mouth 3 (three) times daily as needed for muscle spasms.   ferrous sulfate 325 (65 FE) MG tablet Take 325 mg by mouth daily with breakfast.   furosemide 40 MG tablet Commonly known as:  LASIX Take 1.5 tablets (60 mg total) by mouth daily.   HYDROcodone-acetaminophen 5-325 MG tablet Commonly known as:  NORCO Take 1 tablet by mouth every 4 (four) hours as needed for moderate pain.   INCRUSE ELLIPTA 62.5 MCG/INH Aepb Generic drug:  umeclidinium bromide Inhale 1 puff into the lungs 2 (two) times daily.   Ipratropium-Albuterol 20-100 MCG/ACT Aers respimat Commonly known as:  COMBIVENT Inhale 1 puff into the lungs every 4 (four) hours as needed for wheezing or shortness of breath (or  to help expel mucous).   levothyroxine 25 MCG tablet Commonly known as:  SYNTHROID, LEVOTHROID Take 1.5 tablets (37.5 mcg total) by mouth daily before breakfast.   magnesium oxide 400 (241.3 Mg) MG tablet Commonly known as:  MAG-OX Take 1 tablet (400 mg total) by mouth 2 (two) times daily.   Melatonin 10 MG Tabs Take 30 mg by mouth at bedtime.   nitroGLYCERIN 0.4 MG SL tablet Commonly known as:  NITROSTAT Place 1 tablet (0.4 mg total) under the tongue every 5 (five) minutes as needed for chest pain. For chest pain What changed:  additional instructions   omeprazole 40 MG capsule Commonly known as:  PRILOSEC Take 40 mg by mouth at bedtime.   Oxycodone HCl 10 MG Tabs Take 10 mg by mouth every 6 (six) hours as needed (FOR PAIN).   pilocarpine 1 % ophthalmic solution Commonly known as:  PILOCAR Place 1 drop into both eyes 2 (two) times daily.   potassium  chloride 10 MEQ tablet Commonly known as:  K-DUR,KLOR-CON Take 2 tablets (20 mEq total) by mouth daily. What changed:  when to take this   pramipexole 0.25 MG tablet Commonly known as:  MIRAPEX Take 0.25 mg by mouth at bedtime.   roflumilast 500 MCG Tabs tablet Commonly known as:  DALIRESP Take 500 mcg by mouth every evening.   spironolactone 25 MG tablet Commonly known as:  ALDACTONE Take 1 tablet (25 mg total) by mouth daily. What changed:  when to take this   traZODone 50 MG tablet Commonly known as:  DESYREL Take 50 mg by mouth at bedtime.      Allergies  Allergen Reactions  . Ativan [Lorazepam] Other (See Comments)    Confusion   . Pitavastatin Other (See Comments)    LIVALO: reaction not recalled  . Ropinirole Nausea Only and Other (See Comments)    Makes legs jump, also  . Zofran [Ondansetron Hcl] Nausea Only  . Zolpidem Tartrate Anxiety and Other (See Comments)    CONFUSION   . Penicillins Rash and Other (See Comments)    Tolerated cephalosporins in past Has patient had a PCN reaction causing immediate rash, facial/tongue/throat swelling, SOB or lightheadedness with hypotension: Yes Has patient had a PCN reaction causing severe rash involving mucus membranes or skin necrosis: No Has patient had a PCN reaction that required hospitalization: No Has patient had a PCN reaction occurring within the last 10 years: No If all of the above answers are "NO", then may proceed with Cephalosporin use.    Follow-up Information    [name], MD.   Specialty:  Internal Medicine Contact information: [place] [address] [phone]            The results of significant diagnostics from this hospitalization (including imaging, microbiology, ancillary and laboratory) are listed below for reference.    Significant Diagnostic Studies: Dg Chest 2 View  Result Date: 12/23/2018 CLINICAL DATA:  Shortness of breath EXAM: CHEST - 2 VIEW COMPARISON:   10/07/2018 FINDINGS: Interstitial coarsening and hyperinflation, chronic. Chronic cardiomegaly and aortic tortuosity. Status post CABG. There is no edema, consolidation, effusion, or pneumothorax. Biapical pleural based scarring. Breast implants, lumbar spine hardware, and right reverse glenohumeral arthroplasty. IMPRESSION: 1. No acute finding. 2. COPD. Electronically Signed   By: Monte Fantasia M.D.   On: 12/23/2018 14:03   Dg Knee Complete 4 Views Left  Result Date: 12/23/2018 CLINICAL DATA:  Pt reports bilateral knee pain for 4 days  along with right sided jaw pain that radiates to her neck. Pt also reports worsening shortness of breath with exertion. Pt denies injury. Pt has hx of bilateral knee replacements. EXAM: LEFT KNEE - COMPLETE 4+ VIEW COMPARISON:  05/01/2009 FINDINGS: No fracture.  No bone lesion. Knee prosthetic components appear well seated and well aligned. No evidence of loosening. Skeletal structures are demineralized. Minimal joint effusion suggested. Soft tissues are unremarkable. IMPRESSION: 1. No fracture or bone lesion. 2. No evidence of loosening of the orthopedic hardware. 3. Probable minimal joint effusion. Electronically Signed   By: Lajean Manes M.D.   On: 12/23/2018 18:23   Dg Knee Complete 4 Views Right  Result Date: 12/23/2018 CLINICAL DATA:  Pt reports bilateral knee pain for 4 days along with right sided jaw pain that radiates to her neck. Pt also reports worsening shortness of breath with exertion. Pt denies injury. Pt has hx of bilateral knee replacements. EXAM: RIGHT KNEE - COMPLETE 4+ VIEW COMPARISON:  None. FINDINGS: No fracture or bone lesion.  Bones are demineralized. Knee prosthetic components appear well seated and aligned. No evidence of loosening. Probable minimal joint effusion. IMPRESSION: 1. No fracture or bone lesion. 2. Knee prosthetic hardware appears well seated and aligned. No evidence of loosening. 3. Probable minimal joint effusion. Electronically Signed    By: Lajean Manes M.D.   On: 12/23/2018 18:24    Microbiology: No results found for this or any previous visit (from the past 240 hour(s)).   Labs: Basic Metabolic Panel: Recent Labs  Lab 12/23/18 1202 12/23/18 1315 12/24/18 0734 12/25/18 0520  NA 140 140 138 138  K 4.2 4.0 4.4 4.3  CL 100 102 102 102  CO2 25 30 25 28   GLUCOSE 100* 99 104* 85  BUN 21 18 16 19   CREATININE 1.06* 1.07* 0.95 1.11*  CALCIUM 9.5 9.2 9.3 9.1   Liver Function Tests: Recent Labs  Lab 12/24/18 0734  AST 15  ALT 10  ALKPHOS 82  BILITOT 0.9  PROT 6.8  ALBUMIN 3.4*   No results for input(s): LIPASE, AMYLASE in the last 168 hours. No results for input(s): AMMONIA in the last 168 hours. CBC: Recent Labs  Lab 12/23/18 1315 12/24/18 0330  WBC 8.3 7.4  HGB 11.7* 11.8*  HCT 38.6 38.1  MCV 91.9 89.6  PLT 260 249   Cardiac Enzymes: Recent Labs  Lab 12/24/18 0330 12/24/18 0734 12/24/18 1153  TROPONINI <0.03 <0.03 <0.03   BNP: BNP (last 3 results) Recent Labs    04/01/18 1541 10/06/18 2014 12/23/18 1653  BNP 194.0* 124.2* 102.8*    ProBNP (last 3 results) Recent Labs    09/10/18 1302 10/05/18 0910 11/01/18 1111  PROBNP 417 1,048* 601    CBG: No results for input(s): GLUCAP in the last 168 hours.     SignedRadene Gunning NP [facility] 12/25/2018, 11:04 AM

## 2018-12-25 NOTE — Evaluation (Signed)
Physical Therapy Evaluation & Discharge Patient Details Name: [name] MRN: [id number] DOB: 06-Dec-1941 Today's Date: 12/25/2018   History of Present Illness  Pt is a 78 y.o. female admitted 12/23/18 with worsening SOB and BLE edema. CXR showing signs of COPD and cardiomegaly. Worked up for CHF exacerbation. PMH includes COPD, CHF, CKDIII, a-fib, PE.    Clinical Impression  Patient evaluated by Physical Therapy with no further acute PT needs identified. PTA, pt mod indep with SPC and lives alone; very supportive family nearby who check on pt daily and plan to stay with her upon initial d/c. Today, pt ambulatory with RW at supervision-level. SpO2 >89% on RA. All education has been completed and the patient has no further questions. Pt politely declining HHPT services. Acute PT is signing off. Thank you for this referral.    Follow Up Recommendations No PT follow up;Supervision for mobility/OOB    Equipment Recommendations  None recommended by PT    Recommendations for Other Services       Precautions / Restrictions Precautions Precautions: Fall Restrictions Weight Bearing Restrictions: No      Mobility  Bed Mobility Overal bed mobility: Independent                Transfers Overall transfer level: Independent Equipment used: None                Ambulation/Gait Ambulation/Gait assistance: Supervision Gait Distance (Feet): 350 Feet Assistive device: Rolling walker (2 wheeled);None Gait Pattern/deviations: Step-through pattern;Decreased stride length;Trunk flexed Gait velocity: Decreased Gait velocity interpretation: 1.31 - 2.62 ft/sec, indicative of limited community ambulator General Gait Details: In room ambulation without DME and min guard. Hallway ambulation with RW and supervision; pt with forward flexed posture due to chronic back pain, able to intermittently self-correct  Stairs            Wheelchair Mobility    Modified Rankin (Stroke Patients  Only)       Balance Overall balance assessment: Mild deficits observed, not formally tested                                           Pertinent Vitals/Pain Pain Assessment: No/denies pain    Home Living Family/patient expects to be discharged to:: Private residence Living Arrangements: Alone Available Help at Discharge: Family;Available 24 hours/day Type of Home: House Home Access: Stairs to enter Entrance Stairs-Rails: Right Entrance Stairs-Number of Steps: 3 Home Layout: One level Home Equipment: Walker - 2 wheels;Walker - 4 wheels;Cane - single point;Bedside commode;Shower seat;Tub bench;Wheelchair - Press photographer      Prior Function Level of Independence: Independent with assistive device(s)   Gait / Transfers Assistance Needed: Typically uses SPC; sometimes RW. Family check on pt daily; plan to stay with pt a few days at d/c           Hand Dominance   Dominant Hand: Right    Extremity/Trunk Assessment   Upper Extremity Assessment Upper Extremity Assessment: Overall WFL for tasks assessed    Lower Extremity Assessment Lower Extremity Assessment: Generalized weakness       Communication   Communication: No difficulties  Cognition Arousal/Alertness: Awake/alert Behavior During Therapy: WFL for tasks assessed/performed Overall Cognitive Status: Within Functional Limits for tasks assessed  General Comments General comments (skin integrity, edema, etc.): Daughter and grandaughter present    Exercises     Assessment/Plan    PT Assessment Patent does not need any further PT services  PT Problem List         PT Treatment Interventions      PT Goals (Current goals can be found in the Care Plan section)  Acute Rehab PT Goals PT Goal Formulation: All assessment and education complete, DC therapy    Frequency     Barriers to discharge        Co-evaluation                AM-PAC PT "6 Clicks" Mobility  Outcome Measure Help needed turning from your back to your side while in a flat bed without using bedrails?: None Help needed moving from lying on your back to sitting on the side of a flat bed without using bedrails?: None Help needed moving to and from a bed to a chair (including a wheelchair)?: None Help needed standing up from a chair using your arms (e.g., wheelchair or bedside chair)?: None Help needed to walk in hospital room?: A Little Help needed climbing 3-5 steps with a railing? : A Little 6 Click Score: 22    End of Session Equipment Utilized During Treatment: Gait belt Activity Tolerance: Patient tolerated treatment well Patient left: in bed;with call bell/phone within reach;with family/visitor present Nurse Communication: Mobility status PT Visit Diagnosis: Other abnormalities of gait and mobility (R26.89)    Time: 1003-1015 PT Time Calculation (min) (ACUTE ONLY): 12 min   Charges:   PT Evaluation $PT Eval Moderate Complexity: [place], PT, DPT Acute Rehabilitation Services  Pager [phone] Office [place] 12/25/2018, 10:34 AM

## 2018-12-27 ENCOUNTER — Encounter (HOSPITAL_COMMUNITY): Payer: Self-pay

## 2018-12-29 NOTE — Progress Notes (Signed)
Cardiology Office Note:    Date:  12/30/2018   ID:  [name], DOB August 22, 1941, MRN [id number]  PCP:  [name], MD  Cardiologist:  Dorris Carnes, MD  Referring MD: [name], [name], MD   Chief Complaint  Patient presents with  . Hospitalization Follow-up    CHF    History of Present Illness:    [name] is a 78 y.o. female with a past medical history significant for coronary artery disease, atrial fibrillation and GI bleeding.  She has had multiple other medical issues.  In 2018, she had a prolonged hospitalization after a GI bleed.  Last seen in the office on 11/29/2018 at which time she was mildly volume overloaded.  Lasix was increased to 60 mg daily and then in response to her decreased renal function on labs, she was advised to reduce spironolactone to 25 mg every other day.  She was hospitalized 12/23/18-12/24/18 for worsening swelling and shortness of breath. Work-up did not show significantly elevated BNP.  Weights at home had been stable.  Chest x-ray did not show volume overload.  She had an episode of mild chest pain. She was found to have areas of redness on both legs concerning for cellulitis.  She was also found to have an elevated TSH.  She received a dose of IV Lasix and felt that her breathing was improved.  She was given a course of antibiotics for cellulitis.  She was discharged on her prior medical regimen and advised to be more cautious with her salt intake.  Ms. Heft is here today for hospital follow-up with her daughter. She feels that her breathing is back to her normal, being short of breath with stronger exertion that has been present for years. She has ankle edema, better than when she went to the hospital but worse than when she left the hospital. She has been limiting her salt intake, using [name]. Since discharge her wt has been stable, not more then a pound or 2 different.   She has had one episode of chest heaviness since the hospital that was  relieved quickly with SL NTG.   She feels better now than at her last office visit. No current concerns except for right knee pain.   Her pills are prepacked in bubble packs from the pharmacy and she thinks that she has been taking spironolactone daily, never reduced the dose as her pack was never changed/refilled. .    Past Medical History:  Diagnosis Date  . Anemia    bld. transfusion post lumbar surgery- 2012  . Anxiety   . Arthralgia    NOS  . Blood transfusion 2012; 02/2018   WITH BACK SURGERY; "related to hematoma in stomach" (10/06/2018)  . CAD (coronary artery disease)    s/p CABG 2004; s/p DES to LM in 2010;  [place] 10/29/11: EF 50-55%, mild elevated filling pressures, no pulmonary HTN, LM 90% ISR, LAD and CFX occluded, S-RI occluded (old), S-OM3 ok and L-LAD ok, native nondominant RCA 95% -  med rx recommended ; Lexiscan Myoview 7/13 at [facility]: demonstrated "normal LV function, anterior attenuation and localized ischemia, inferior, basilar, mid section"  . Carotid artery disease ([place])    Carotid US 3/66:  RICA 2-94; LICA 76-54; R subclavian stenosis - Repeat 1 year.  . CHF (congestive heart failure) ([place])   . Chronic diastolic heart failure (HCC)    Echo 9/10: EF 65-03%, grade 1 diastolic dysfunction  . Chronic lower back pain   .  COPD (chronic obstructive pulmonary disease) ([place])    Emphysema dxed by Dr. Woody Seller in [place] based on PFTs per pt in 2006; placed on albuterol  . Depression    TAKES CELEXA  AND  (OFF- WELBUTRIN)  . Diuretic-induced hypokalemia 10/08/2018  . DVT of lower extremity (deep venous thrombosis) (HCC)    recurrent. bilateral (2 episodes)  . Dyspnea   . Dysrhythmia    afib with cabg  . Exertional angina (HCC)    Treated with Isosorbide, Ranexa, amlodipine; intolerant to metoprolol  . GERD (gastroesophageal reflux disease)   . Gout    "on daily RX" (10/06/2018)  . HLD (hyperlipidemia)   . Hypertension   . Hyperthyroidism   . Obesity (BMI  30-39.9) 2009   BMI 33  . Osteoarthritis    "all over; hands" (10/06/2018)  . Oxygen deficiency   . Oxygen dependent    4L at home as needed (10/06/2018)  . Pneumonia   . PVD (peripheral vascular disease) ([place])   . Sleep apnea 2012   USED CPAP THEN  STARTED USING SPIRIVA , Nov. 2013- last evaluation , changed from mask to aparatus that is just for her nose.. ; reports 05-28-18 "the mask smothers me so i dont use it right now" (10/06/2018)    Past Surgical History:  Procedure Laterality Date  . ANKLE SURGERY  2004  . ANTERIOR CERVICAL DECOMP/DISCECTOMY FUSION  11/2007  . AUGMENTATION MAMMAPLASTY    . [place]   lower; another scheduled, opt. reports 4 back- lumbar, 3 cerv. fusions  for later 2009  . CARDIAC CATHETERIZATION  11/2009   Patent LIMA to LAD and patent SVG to OM1. Occluded SVG to ramus and diagonal. Left main: 90% ostial stenosis, LCX 60-70% proximal stenosis  . CATARACT EXTRACTION W/ INTRAOCULAR LENS  IMPLANT, BILATERAL Bilateral 2006  . COLONOSCOPY WITH PROPOFOL N/A 02/17/2017   Procedure: COLONOSCOPY WITH PROPOFOL;  Surgeon: Jerene Bears, MD;  [facility] END[facility]COPY;  Service: Endoscopy;  Laterality: N/A;  . CORONARY ANGIOPLASTY WITH STENT PLACEMENT  11/2009   Drug eluting stent to left main artery: 4.0 X 12 mm Ion   . CORONARY ARTERY BYPASS GRAFT  2004   "CABG X4"  . ESOPHAGOGASTRODUODENOSCOPY N/A 02/14/2017   Procedure: ESOPHAGOGASTRODUODENOSCOPY (EGD);  Surgeon: Doran Stabler, MD;  Location: [facility] ENDOSCOPY;  Service: Endoscopy;  Laterality: N/A;  . GIVENS CAPSULE STUDY N/A 02/15/2017   Procedure: GIVENS CAPSULE STUDY;  Surgeon: Doran Stabler, MD;  Location: [place];  Service: Endoscopy;  Laterality: N/A;  . GROIN MASS OPEN BIOPSY  2004  . IR IVC FILTER PLMT / S&I /IMG GUID/MOD SED  03/14/2018  . IR PARACENTESIS  03/10/2018  . JOINT REPLACEMENT    . LAPAROSCOPIC CHOLECYSTECTOMY    . PERIPHERAL VASCULAR INTERVENTION Left 03/05/2018   Procedure:  PERIPHERAL VASCULAR INTERVENTION;  Surgeon: Elam Dutch, MD;  Location: [place] CV LAB;  Service: Cardiovascular;  Laterality: Left;  Attempted unsuccess\ful Per Dr. Eden Lathe  . PERIPHERAL VASCULAR INTERVENTION  03/08/2018   Procedure: PERIPHERAL VASCULAR INTERVENTION;  Surgeon: Waynetta Sandy, MD;  Location: [place] CV LAB;  Service: Cardiovascular;;  SMA Stent   . REPLACEMENT TOTAL KNEE BILATERAL Bilateral 2012  . REVERSE SHOULDER ARTHROPLASTY  02/26/2012   Procedure: REVERSE SHOULDER ARTHROPLASTY;  Surgeon: Marin Shutter, MD;  Location: [place];  Service: Orthopedics;  Laterality: Right;  RIGHT SHOULDER REVERSED ARTHROPLASTY  . SHOULDER ARTHROSCOPY WITH SUBACROMIAL DECOMPRESSION Left 02/10/2013   Procedure: SHOULDER  ARTHROSCOPY WITH SUBACROMIAL DECOMPRESSION DISTAL CLAVICLE RESECTION;  Surgeon: Marin Shutter, MD;  Location: [place];  Service: Orthopedics;  Laterality: Left;  DISTAL CLAVICLE RESECTION  . TONSILLECTOMY    . TOTAL HIP ARTHROPLASTY  2007   Right  . TRANSURETHRAL RESECTION OF BLADDER TUMOR N/A 05/31/2018   Procedure: TRANSURETHRAL RESECTION OF BLADDER TUMOR (TURBT);  Surgeon: Lucas Mallow, MD;  Location: [facility];  Service: Urology;  Laterality: N/A;  . VISCERAL ANGIOGRAPHY N/A 03/05/2018   Procedure: VISCERAL ANGIOGRAPHY;  Surgeon: Elam Dutch, MD;  Location: [place] [facility];  Service: Cardiovascular;  Laterality: N/A;  . VISCERAL ANGIOGRAPHY N/A 03/08/2018   Procedure: VISCERAL ANGIOGRAPHY;  Surgeon: Waynetta Sandy, MD;  Location: [place] [facility];  Service: Cardiovascular;  Laterality: N/A;    Current Medications: Current Meds  Medication Sig  . allopurinol (ZYLOPRIM) 100 MG tablet Take 100 mg by mouth daily.  Marland Kitchen aspirin EC 81 MG tablet Take 81 mg by mouth daily.  . Aspirin-Salicylamide-Caffeine (BC HEADACHE POWDER PO) Take 1 packet by mouth as needed (for headaches).  Marland Kitchen atorvastatin (LIPITOR) 20 MG tablet Take 1 tablet (20 mg total) by  mouth daily.  . citalopram (CELEXA) 20 MG tablet Take 30 mg by mouth at bedtime.   . CVS D3 1000 units capsule Take 1,000 Units by mouth daily.  . cyclobenzaprine (FLEXERIL) 10 MG tablet Take 10 mg by mouth 3 (three) times daily as needed for muscle spasms.   . ferrous sulfate 325 (65 FE) MG tablet Take 325 mg by mouth daily with breakfast.  . furosemide (LASIX) 40 MG tablet Take 1.5 tablets (60 mg total) by mouth daily.  Marland Kitchen HYDROcodone-acetaminophen (NORCO) 5-325 MG tablet Take 1 tablet by mouth every 4 (four) hours as needed for moderate pain.  . INCRUSE ELLIPTA 62.5 MCG/INH AEPB Inhale 1 puff into the lungs 2 (two) times daily.   . Ipratropium-Albuterol (COMBIVENT) 20-100 MCG/ACT AERS respimat Inhale 1 puff into the lungs every 4 (four) hours as needed for wheezing or shortness of breath (or to help expel mucous).   Marland Kitchen levothyroxine (SYNTHROID, LEVOTHROID) 25 MCG tablet Take 1.5 tablets (37.5 mcg total) by mouth daily before breakfast.  . magnesium oxide (MAG-OX) 400 (241.3 Mg) MG tablet Take 1 tablet (400 mg total) by mouth 2 (two) times daily.  . Melatonin 10 MG TABS Take 30 mg by mouth at bedtime.  . nitroGLYCERIN (NITROSTAT) 0.4 MG SL tablet Place 1 tablet (0.4 mg total) under the tongue every 5 (five) minutes as needed for chest pain. For chest pain  . omeprazole (PRILOSEC) 40 MG capsule Take 40 mg by mouth at bedtime.   . Oxycodone HCl 10 MG TABS Take 10 mg by mouth every 6 (six) hours as needed (FOR PAIN).   Marland Kitchen pilocarpine (PILOCAR) 1 % ophthalmic solution Place 1 drop into both eyes 2 (two) times daily.  . potassium chloride (K-DUR,KLOR-CON) 10 MEQ tablet Take 2 tablets (20 mEq total) by mouth daily.  . pramipexole (MIRAPEX) 0.25 MG tablet Take 0.25 mg by mouth at bedtime.  . roflumilast (DALIRESP) 500 MCG TABS tablet Take 500 mcg by mouth every evening.   Marland Kitchen spironolactone (ALDACTONE) 25 MG tablet Take 1 tablet (25 mg total) by mouth daily.  . traZODone (DESYREL) 50 MG tablet Take 50 mg  by mouth at bedtime.      Allergies:   Ativan [lorazepam]; Pitavastatin; Ropinirole; Zofran [ondansetron hcl]; Zolpidem tartrate; and Penicillins   Social History   Socioeconomic History  .  Marital status: Widowed    Spouse name: Not on file  . Number of children: 3  . Years of education: Not on file  . Highest education level: Not on file  Occupational History  . Occupation: retired    Comment: Electronics engineer: RETIRED  Social Needs  . Financial resource strain: Not on file  . Food insecurity:    Worry: Not on file    Inability: Not on file  . Transportation needs:    Medical: Not on file    Non-medical: Not on file  Tobacco Use  . Smoking status: Former Smoker    Packs/day: 1.00    Years: 45.00    Pack years: 45.00    Types: Cigarettes    Last attempt to quit: 12/22/2000    Years since quitting: 18.0  . Smokeless tobacco: Never Used  Substance and Sexual Activity  . Alcohol use: Never    Alcohol/week: 0.0 standard drinks    Frequency: Never  . Drug use: Never  . Sexual activity: Not Currently  Lifestyle  . Physical activity:    Days per week: Not on file    Minutes per session: Not on file  . Stress: Not on file  Relationships  . Social connections:    Talks on phone: Not on file    Gets together: Not on file    Attends religious service: Not on file    Active member of club or organization: Not on file    Attends meetings of clubs or organizations: Not on file    Relationship status: Not on file  Other Topics Concern  . Not on file  Social History Narrative      3 grown children.     Family History: The patient's family history includes COPD in her father; Cancer in her mother, sister, and another family member; Colitis in an other family member; Heart disease in her father, sister, and another family member; Lung cancer in her father; Ovarian cancer in her mother. ROS:   Please see the history of present illness.     All other systems  reviewed and are negative.  EKGs/Labs/Other Studies Reviewed:    The following studies were reviewed today:  Echocardiogram 11/09/2018: Study Conclusions - Left ventricle: The cavity size was normal. There was mild concentric hypertrophy. Systolic function was normal. The estimated ejection fraction was in the range of 60% to 65%. Wall motion was normal; there were no regional wall motion abnormalities. There was an increased relative contribution of atrial contraction to ventricular filling. Doppler parameters are consistent with abnormal left ventricular relaxation (grade 1 diastolic dysfunction). Doppler parameters are consistent with high ventricular filling pressure. - Aortic valve: Trileaflet; normal thickness, mildly calcified leaflets. - Mitral valve: Severely calcified annulus. There was trivial regurgitation. - Left atrium: The atrium was mildly dilated. - Right atrium: The atrium was mildly dilated. - Pulmonic valve: There was trivial regurgitation.  LE Vas [place] 11/02/2018: Summary: Right: No evidence of deep vein thrombosis in the lower extremity. No indirect evidence of obstruction proximal to the inguinal ligament. No reflux was noted in the common femoral vein. Left: No evidence of deep vein thrombosis in the lower extremity. No indirect evidence of obstruction proximal to the inguinal ligament. Findings appear improved from previous examination.  EKG:  EKG is not ordered today.    Recent Labs: 10/19/2018: Magnesium 2.1 11/01/2018: NT-Pro BNP 601 12/23/2018: B Natriuretic Peptide 102.8 12/24/2018: ALT 10; Hemoglobin 11.8; Platelets 249; TSH 5.786  12/25/2018: BUN 19; Creatinine, Ser 1.11; Potassium 4.3; Sodium 138   Recent Lipid Panel    Component Value Date/Time   CHOL 106 07/01/2018 0800   CHOL 98 05/23/2014 0857   TRIG 88 07/01/2018 0800   TRIG 83 05/23/2014 0857   HDL 38 (L) 07/01/2018 0800   HDL 35 (L) 05/23/2014 0857   CHOLHDL 2.8  07/01/2018 0800   CHOLHDL 4 03/03/2013 1136   VLDL 24.4 03/03/2013 1136   LDLCALC 50 07/01/2018 0800   LDLCALC 46 05/23/2014 0857    Physical Exam:    VS:  BP 140/62   Pulse 92   Ht 5\' 8"  (1.727 m)   Wt 165 lb 1.9 oz (74.9 kg)   SpO2 99%   BMI 25.11 kg/m     Wt Readings from Last 3 Encounters:  12/30/18 165 lb 1.9 oz (74.9 kg)  12/25/18 164 lb 3.2 oz (74.5 kg)  11/29/18 162 lb 12.8 oz (73.8 kg)     Physical Exam  Constitutional: She is oriented to person, place, and time. She appears well-developed and well-nourished. No distress.  HENT:  Head: Normocephalic and atraumatic.  Neck: Normal range of motion. Neck supple. No JVD present.  Cardiovascular: Normal rate and regular rhythm.  Murmur heard.  Harsh systolic murmur is present with a grade of 1/6 at the upper right sternal border radiating to the neck. Pulmonary/Chest: Effort normal and breath sounds normal. No respiratory distress. She has no wheezes. She has no rales.  Abdominal: Soft. Bowel sounds are normal.  Musculoskeletal: Normal range of motion.        General: Edema present.     Comments: Trace pretibial/pedal edema  Neurological: She is alert and oriented to person, place, and time.  Skin: Skin is warm and dry.  Psychiatric: She has a normal mood and affect. Her behavior is normal. Judgment and thought content normal.  Vitals reviewed.    ASSESSMENT:    1. Acute on chronic diastolic heart failure ([place])   2. Coronary artery disease involving native coronary artery of native heart with other form of angina pectoris ([place])   3. Essential hypertension   4. Cellulitis of lower extremity, unspecified laterality   5. Hypothyroidism, unspecified type   6. Bilateral carotid artery stenosis    PLAN:    In order of problems listed above:  1.  Acute on chronic diastolic heart failure -At office visit on 11/29/2018 Lasix was increased to 60 mg daily and  -Admitted to the hospital 1/2-1/3 2020 for complaints of  increased swelling and shortness of breath.  Diagnostics did not reveal significant volume overload although the patient improved with IV diuresis. -Advised to limit her sodium intake which she reports that she is doing. -Today she is feeling well. Breathing is at baseline. LE edema is mild.  -Continue current therapy. Reviewed daily wts and report gain of 3 lbs in a day or 5 lbs in a week.   2.  CAD -Had some atypical chest pain complaints during recent hospitalization. One brief episode of chest pain since hospital, not concerning to pt.  -Continues on aspirin and statin  3.  Hypertension -BP controlled.  -Labs were stable in recent hosptial  4.  History of DVT/PE -S/p IVC filter, not on anticoagulation secondary to history of GI bleed -And evaluation for lower extremity edema, lower extremity Doppler in 10/2018 was negative for DVT  5. Cellulitis -Treated with round of antibiotics, improving, still a little pink. Follow up with PCP.  6. Hypothyroidism -TSH was elevated in the hospital. She was taking thyroid med (in bubble pack) with her other meds with food. In the hospital she learned to take the synthroid on an empty stomach.  -Pt advised to follow up with PCP, has appt in a couple of weeks.   7. Carotid artery stenosis -Pt has ?bilateral carotid bruit vs harsh flow originating form heart. Has very faint murmur at RUSB.  -Carotid US in 05/2018 showed 6-57% RICA, 84-69% LICA.  -On statin   Medication Adjustments/Labs and Tests Ordered: Current medicines are reviewed at length with the patient today.  Concerns regarding medicines are outlined above. Labs and tests ordered and medication changes are outlined in the patient instructions below:  Patient Instructions  Medication Instructions:  Your physician recommends that you continue on your current medications as directed. Please refer to the Current Medication list given to you today.  If you need a refill on your cardiac  medications before your next appointment, please call your pharmacy.   Lab work: None  If you have labs (blood work) drawn today and your tests are completely normal, you will receive your results only by: Marland Kitchen MyChart Message (if you have MyChart) OR . A paper copy in the mail If you have any lab test that is abnormal or we need to change your treatment, we will call you to review the results.  Testing/Procedures: None  Follow-Up: At [facility], you and your health needs are our priority.  As part of our continuing mission to provide you with exceptional heart care, we have created designated Provider Care Teams.  These Care Teams include your primary Cardiologist (physician) and Advanced Practice Providers (APPs -  Physician Assistants and Nurse Practitioners) who all work together to provide you with the care you need, when you need it. You will need a follow up appointment in:  3 months.  Please call our office 2 months in advance to schedule this appointment.  You may see Dorris Carnes, MD or one of the following Advanced Practice Providers on your designated Care Team: [name], PA-C [place], [place] . Daune Perch, NP  Any Other Special Instructions Will Be Listed Below (If Applicable).  DASH Eating Plan DASH stands for "Dietary Approaches to Stop Hypertension." The DASH eating plan is a healthy eating plan that has been shown to reduce high blood pressure (hypertension). It may also reduce your risk for type 2 diabetes, heart disease, and stroke. The DASH eating plan may also help with weight loss. What are tips for following this plan?  General guidelines  Avoid eating more than 2,300 mg (milligrams) of salt (sodium) a day. If you have hypertension, you may need to reduce your sodium intake to 1,500 mg a day.  Limit alcohol intake to no more than 1 drink a day for nonpregnant women and 2 drinks a day for men. One drink equals 12 oz of beer, 5 oz of wine, or 1 oz of hard  liquor.  Work with your health care provider to maintain a healthy body weight or to lose weight. Ask what an ideal weight is for you.  Get at least 30 minutes of exercise that causes your heart to beat faster (aerobic exercise) most days of the week. Activities may include walking, swimming, or biking.  Work with your health care provider or diet and nutrition specialist (dietitian) to adjust your eating plan to your individual calorie needs. Reading food labels   Check food labels for the  amount of sodium per serving. Choose foods with less than 5 percent of the Daily Value of sodium. Generally, foods with less than 300 mg of sodium per serving fit into this eating plan.  To find whole grains, look for the word "whole" as the first word in the ingredient list. Shopping  Buy products labeled as "low-sodium" or "no salt added."  Buy fresh foods. Avoid canned foods and premade or frozen meals. Cooking  Avoid adding salt when cooking. Use salt-free seasonings or herbs instead of table salt or sea salt. Check with your health care provider or pharmacist before using salt substitutes.  Do not fry foods. Cook foods using healthy methods such as baking, boiling, grilling, and broiling instead.  Cook with heart-healthy oils, such as olive, canola, soybean, or sunflower oil. Meal planning  Eat a balanced diet that includes: ? 5 or more servings of fruits and vegetables each day. At each meal, try to fill half of your plate with fruits and vegetables. ? Up to 6-8 servings of whole grains each day. ? Less than 6 oz of lean meat, poultry, or fish each day. A 3-oz serving of meat is about the same size as a deck of cards. One egg equals 1 oz. ? 2 servings of low-fat dairy each day. ? A serving of nuts, seeds, or beans 5 times each week. ? Heart-healthy fats. Healthy fats called Omega-3 fatty acids are found in foods such as flaxseeds and coldwater fish, like sardines, salmon, and  mackerel.  Limit how much you eat of the following: ? Canned or prepackaged foods. ? Food that is high in trans fat, such as fried foods. ? Food that is high in saturated fat, such as fatty meat. ? Sweets, desserts, sugary drinks, and other foods with added sugar. ? Full-fat dairy products.  Do not salt foods before eating.  Try to eat at least 2 vegetarian meals each week.  Eat more home-cooked food and less restaurant, buffet, and fast food.  When eating at a restaurant, ask that your food be prepared with less salt or no salt, if possible. What foods are recommended? The items listed may not be a complete list. Talk with your dietitian about what dietary choices are best for you. Grains Whole-grain or whole-wheat bread. Whole-grain or whole-wheat pasta. Brown rice. [place]. Bulgur. Whole-grain and low-sodium cereals. Pita bread. Low-fat, low-sodium crackers. Whole-wheat flour tortillas. Vegetables Fresh or frozen vegetables (raw, steamed, roasted, or grilled). Low-sodium or reduced-sodium tomato and vegetable juice. Low-sodium or reduced-sodium tomato sauce and tomato paste. Low-sodium or reduced-sodium canned vegetables. Fruits All fresh, dried, or frozen fruit. Canned fruit in natural juice (without added sugar). Meat and other protein foods Skinless chicken or [place]. Ground chicken or [place]. Pork with fat trimmed off. Fish and seafood. Egg whites. Dried beans, peas, or lentils. Unsalted nuts, nut butters, and seeds. Unsalted canned beans. Lean cuts of beef with fat trimmed off. Low-sodium, lean deli meat. Dairy Low-fat (1%) or fat-free (skim) milk. Fat-free, low-fat, or reduced-fat cheeses. Nonfat, low-sodium ricotta or cottage cheese. Low-fat or nonfat yogurt. Low-fat, low-sodium cheese. Fats and oils Soft margarine without trans fats. Vegetable oil. Low-fat, reduced-fat, or light mayonnaise and salad dressings (reduced-sodium). Canola, safflower, olive, soybean, and  sunflower oils. Avocado. Seasoning and other foods Herbs. Spices. Seasoning mixes without salt. Unsalted popcorn and pretzels. Fat-free sweets. What foods are not recommended? The items listed may not be a complete list. Talk with your dietitian about what dietary choices are best  for you. Grains Baked goods made with fat, such as croissants, muffins, or some breads. Dry pasta or rice meal packs. Vegetables Creamed or fried vegetables. Vegetables in a cheese sauce. Regular canned vegetables (not low-sodium or reduced-sodium). Regular canned tomato sauce and paste (not low-sodium or reduced-sodium). Regular tomato and vegetable juice (not low-sodium or reduced-sodium). Angie Fava. Olives. Fruits Canned fruit in a light or heavy syrup. Fried fruit. Fruit in cream or butter sauce. Meat and other protein foods Fatty cuts of meat. Ribs. Fried meat. Berniece Salines. Sausage. Bologna and other processed lunch meats. Salami. Fatback. Hotdogs. Bratwurst. Salted nuts and seeds. Canned beans with added salt. Canned or smoked fish. Whole eggs or egg yolks. Chicken or [place] with skin. Dairy Whole or 2% milk, cream, and half-and-half. Whole or full-fat cream cheese. Whole-fat or sweetened yogurt. Full-fat cheese. Nondairy creamers. Whipped toppings. Processed cheese and cheese spreads. Fats and oils Butter. Stick margarine. Lard. Shortening. Ghee. Bacon fat. Tropical oils, such as coconut, palm kernel, or palm oil. Seasoning and other foods Salted popcorn and pretzels. Onion salt, garlic salt, seasoned salt, table salt, and sea salt. Worcestershire sauce. Tartar sauce. Barbecue sauce. Teriyaki sauce. Soy sauce, including reduced-sodium. Steak sauce. Canned and packaged gravies. Fish sauce. Oyster sauce. Cocktail sauce. Horseradish that you find on the shelf. Ketchup. Mustard. Meat flavorings and tenderizers. Bouillon cubes. Hot sauce and Tabasco sauce. Premade or packaged marinades. Premade or packaged taco seasonings.  Relishes. Regular salad dressings. Where to find more information:  National Heart, Lung, and [place]: https://wilson-eaton.com/  American Heart Association: www.heart.org Summary  The DASH eating plan is a healthy eating plan that has been shown to reduce high blood pressure (hypertension). It may also reduce your risk for type 2 diabetes, heart disease, and stroke.  With the DASH eating plan, you should limit salt (sodium) intake to 2,300 mg a day. If you have hypertension, you may need to reduce your sodium intake to 1,500 mg a day.  When on the DASH eating plan, aim to eat more fresh fruits and vegetables, whole grains, lean proteins, low-fat dairy, and heart-healthy fats.  Work with your health care provider or diet and nutrition specialist (dietitian) to adjust your eating plan to your individual calorie needs. This information is not intended to replace advice given to you by your health care provider. Make sure you discuss any questions you have with your health care provider. Document Released: 11/27/2011 Document Revised: 12/01/2016 Document Reviewed: 12/01/2016 Elsevier Interactive Patient Education  2019 [place], Daune Perch, NP  12/30/2018 10:58 AM    [facility]

## 2018-12-30 ENCOUNTER — Encounter: Payer: Self-pay | Admitting: Cardiology

## 2018-12-30 ENCOUNTER — Ambulatory Visit (INDEPENDENT_AMBULATORY_CARE_PROVIDER_SITE_OTHER): Payer: Medicare HMO | Admitting: Cardiology

## 2018-12-30 VITALS — BP 140/62 | HR 92 | Ht 68.0 in | Wt 165.1 lb

## 2018-12-30 DIAGNOSIS — I10 Essential (primary) hypertension: Secondary | ICD-10-CM | POA: Diagnosis not present

## 2018-12-30 DIAGNOSIS — E039 Hypothyroidism, unspecified: Secondary | ICD-10-CM

## 2018-12-30 DIAGNOSIS — Z8719 Personal history of other diseases of the digestive system: Secondary | ICD-10-CM | POA: Diagnosis not present

## 2018-12-30 DIAGNOSIS — I5033 Acute on chronic diastolic (congestive) heart failure: Secondary | ICD-10-CM | POA: Diagnosis not present

## 2018-12-30 DIAGNOSIS — Z791 Long term (current) use of non-steroidal anti-inflammatories (NSAID): Secondary | ICD-10-CM | POA: Diagnosis not present

## 2018-12-30 DIAGNOSIS — K7460 Unspecified cirrhosis of liver: Secondary | ICD-10-CM | POA: Diagnosis not present

## 2018-12-30 DIAGNOSIS — I25118 Atherosclerotic heart disease of native coronary artery with other forms of angina pectoris: Secondary | ICD-10-CM | POA: Diagnosis not present

## 2018-12-30 DIAGNOSIS — I6523 Occlusion and stenosis of bilateral carotid arteries: Secondary | ICD-10-CM | POA: Diagnosis not present

## 2018-12-30 DIAGNOSIS — K219 Gastro-esophageal reflux disease without esophagitis: Secondary | ICD-10-CM | POA: Diagnosis not present

## 2018-12-30 DIAGNOSIS — L03119 Cellulitis of unspecified part of limb: Secondary | ICD-10-CM

## 2018-12-30 DIAGNOSIS — R188 Other ascites: Secondary | ICD-10-CM | POA: Diagnosis not present

## 2018-12-30 NOTE — Patient Instructions (Signed)
Medication Instructions:  Your physician recommends that you continue on your current medications as directed. Please refer to the Current Medication list given to you today.  If you need a refill on your cardiac medications before your next appointment, please call your pharmacy.   Lab work: None  If you have labs (blood work) drawn today and your tests are completely normal, you will receive your results only by: Marland Kitchen MyChart Message (if you have MyChart) OR . A paper copy in the mail If you have any lab test that is abnormal or we need to change your treatment, we will call you to review the results.  Testing/Procedures: None  Follow-Up: At [facility], you and your health needs are our priority.  As part of our continuing mission to provide you with exceptional heart care, we have created designated Provider Care Teams.  These Care Teams include your primary Cardiologist (physician) and Advanced Practice Providers (APPs -  Physician Assistants and Nurse Practitioners) who all work together to provide you with the care you need, when you need it. You will need a follow up appointment in:  3 months.  Please call our office 2 months in advance to schedule this appointment.  You may see Dorris Carnes, MD or one of the following Advanced Practice Providers on your designated Care Team: [name], PA-C [place], [place] . Daune Perch, NP  Any Other Special Instructions Will Be Listed Below (If Applicable).  DASH Eating Plan DASH stands for "Dietary Approaches to Stop Hypertension." The DASH eating plan is a healthy eating plan that has been shown to reduce high blood pressure (hypertension). It may also reduce your risk for type 2 diabetes, heart disease, and stroke. The DASH eating plan may also help with weight loss. What are tips for following this plan?  General guidelines  Avoid eating more than 2,300 mg (milligrams) of salt (sodium) a day. If you have hypertension, you may need  to reduce your sodium intake to 1,500 mg a day.  Limit alcohol intake to no more than 1 drink a day for nonpregnant women and 2 drinks a day for men. One drink equals 12 oz of beer, 5 oz of wine, or 1 oz of hard liquor.  Work with your health care provider to maintain a healthy body weight or to lose weight. Ask what an ideal weight is for you.  Get at least 30 minutes of exercise that causes your heart to beat faster (aerobic exercise) most days of the week. Activities may include walking, swimming, or biking.  Work with your health care provider or diet and nutrition specialist (dietitian) to adjust your eating plan to your individual calorie needs. Reading food labels   Check food labels for the amount of sodium per serving. Choose foods with less than 5 percent of the Daily Value of sodium. Generally, foods with less than 300 mg of sodium per serving fit into this eating plan.  To find whole grains, look for the word "whole" as the first word in the ingredient list. Shopping  Buy products labeled as "low-sodium" or "no salt added."  Buy fresh foods. Avoid canned foods and premade or frozen meals. Cooking  Avoid adding salt when cooking. Use salt-free seasonings or herbs instead of table salt or sea salt. Check with your health care provider or pharmacist before using salt substitutes.  Do not fry foods. Cook foods using healthy methods such as baking, boiling, grilling, and broiling instead.  Cook with heart-healthy oils,  such as olive, canola, soybean, or sunflower oil. Meal planning  Eat a balanced diet that includes: ? 5 or more servings of fruits and vegetables each day. At each meal, try to fill half of your plate with fruits and vegetables. ? Up to 6-8 servings of whole grains each day. ? Less than 6 oz of lean meat, poultry, or fish each day. A 3-oz serving of meat is about the same size as a deck of cards. One egg equals 1 oz. ? 2 servings of low-fat dairy each day. ? A  serving of nuts, seeds, or beans 5 times each week. ? Heart-healthy fats. Healthy fats called Omega-3 fatty acids are found in foods such as flaxseeds and coldwater fish, like sardines, salmon, and mackerel.  Limit how much you eat of the following: ? Canned or prepackaged foods. ? Food that is high in trans fat, such as fried foods. ? Food that is high in saturated fat, such as fatty meat. ? Sweets, desserts, sugary drinks, and other foods with added sugar. ? Full-fat dairy products.  Do not salt foods before eating.  Try to eat at least 2 vegetarian meals each week.  Eat more home-cooked food and less restaurant, buffet, and fast food.  When eating at a restaurant, ask that your food be prepared with less salt or no salt, if possible. What foods are recommended? The items listed may not be a complete list. Talk with your dietitian about what dietary choices are best for you. Grains Whole-grain or whole-wheat bread. Whole-grain or whole-wheat pasta. Brown rice. [place]. Bulgur. Whole-grain and low-sodium cereals. Pita bread. Low-fat, low-sodium crackers. Whole-wheat flour tortillas. Vegetables Fresh or frozen vegetables (raw, steamed, roasted, or grilled). Low-sodium or reduced-sodium tomato and vegetable juice. Low-sodium or reduced-sodium tomato sauce and tomato paste. Low-sodium or reduced-sodium canned vegetables. Fruits All fresh, dried, or frozen fruit. Canned fruit in natural juice (without added sugar). Meat and other protein foods Skinless chicken or [place]. Ground chicken or [place]. Pork with fat trimmed off. Fish and seafood. Egg whites. Dried beans, peas, or lentils. Unsalted nuts, nut butters, and seeds. Unsalted canned beans. Lean cuts of beef with fat trimmed off. Low-sodium, lean deli meat. Dairy Low-fat (1%) or fat-free (skim) milk. Fat-free, low-fat, or reduced-fat cheeses. Nonfat, low-sodium ricotta or cottage cheese. Low-fat or nonfat yogurt. Low-fat,  low-sodium cheese. Fats and oils Soft margarine without trans fats. Vegetable oil. Low-fat, reduced-fat, or light mayonnaise and salad dressings (reduced-sodium). Canola, safflower, olive, soybean, and sunflower oils. Avocado. Seasoning and other foods Herbs. Spices. Seasoning mixes without salt. Unsalted popcorn and pretzels. Fat-free sweets. What foods are not recommended? The items listed may not be a complete list. Talk with your dietitian about what dietary choices are best for you. Grains Baked goods made with fat, such as croissants, muffins, or some breads. Dry pasta or rice meal packs. Vegetables Creamed or fried vegetables. Vegetables in a cheese sauce. Regular canned vegetables (not low-sodium or reduced-sodium). Regular canned tomato sauce and paste (not low-sodium or reduced-sodium). Regular tomato and vegetable juice (not low-sodium or reduced-sodium). Angie Fava. Olives. Fruits Canned fruit in a light or heavy syrup. Fried fruit. Fruit in cream or butter sauce. Meat and other protein foods Fatty cuts of meat. Ribs. Fried meat. Berniece Salines. Sausage. Bologna and other processed lunch meats. Salami. Fatback. Hotdogs. Bratwurst. Salted nuts and seeds. Canned beans with added salt. Canned or smoked fish. Whole eggs or egg yolks. Chicken or [place] with skin. Dairy Whole or 2% milk, cream,  and half-and-half. Whole or full-fat cream cheese. Whole-fat or sweetened yogurt. Full-fat cheese. Nondairy creamers. Whipped toppings. Processed cheese and cheese spreads. Fats and oils Butter. Stick margarine. Lard. Shortening. Ghee. Bacon fat. Tropical oils, such as coconut, palm kernel, or palm oil. Seasoning and other foods Salted popcorn and pretzels. Onion salt, garlic salt, seasoned salt, table salt, and sea salt. Worcestershire sauce. Tartar sauce. Barbecue sauce. Teriyaki sauce. Soy sauce, including reduced-sodium. Steak sauce. Canned and packaged gravies. Fish sauce. Oyster sauce. Cocktail sauce.  Horseradish that you find on the shelf. Ketchup. Mustard. Meat flavorings and tenderizers. Bouillon cubes. Hot sauce and Tabasco sauce. Premade or packaged marinades. Premade or packaged taco seasonings. Relishes. Regular salad dressings. Where to find more information:  National Heart, Lung, and [place]: https://wilson-eaton.com/  American Heart Association: www.heart.org Summary  The DASH eating plan is a healthy eating plan that has been shown to reduce high blood pressure (hypertension). It may also reduce your risk for type 2 diabetes, heart disease, and stroke.  With the DASH eating plan, you should limit salt (sodium) intake to 2,300 mg a day. If you have hypertension, you may need to reduce your sodium intake to 1,500 mg a day.  When on the DASH eating plan, aim to eat more fresh fruits and vegetables, whole grains, lean proteins, low-fat dairy, and heart-healthy fats.  Work with your health care provider or diet and nutrition specialist (dietitian) to adjust your eating plan to your individual calorie needs. This information is not intended to replace advice given to you by your health care provider. Make sure you discuss any questions you have with your health care provider. Document Released: 11/27/2011 Document Revised: 12/01/2016 Document Reviewed: 12/01/2016 Elsevier Interactive Patient Education  2019 [employer].

## 2019-01-03 ENCOUNTER — Other Ambulatory Visit: Payer: Self-pay | Admitting: Gastroenterology

## 2019-01-03 DIAGNOSIS — K7460 Unspecified cirrhosis of liver: Secondary | ICD-10-CM

## 2019-01-03 DIAGNOSIS — I5032 Chronic diastolic (congestive) heart failure: Secondary | ICD-10-CM | POA: Diagnosis not present

## 2019-01-05 ENCOUNTER — Other Ambulatory Visit: Payer: Self-pay | Admitting: Internal Medicine

## 2019-01-05 DIAGNOSIS — I5032 Chronic diastolic (congestive) heart failure: Secondary | ICD-10-CM

## 2019-01-05 MED ORDER — SPIRONOLACTONE 25 MG PO TABS: 25.0000 mg | ORAL_TABLET | Freq: Every day | ORAL | 11 refills | Status: DC

## 2019-01-05 NOTE — Addendum Note (Signed)
Addended by: Derl Barrow on: 01/05/2019 08:52 AM   Modules accepted: Orders

## 2019-01-06 DIAGNOSIS — J449 Chronic obstructive pulmonary disease, unspecified: Secondary | ICD-10-CM | POA: Diagnosis not present

## 2019-01-06 DIAGNOSIS — M25561 Pain in right knee: Secondary | ICD-10-CM | POA: Diagnosis not present

## 2019-01-11 ENCOUNTER — Ambulatory Visit
Admission: RE | Admit: 2019-01-11 | Discharge: 2019-01-11 | Disposition: A | Discharge status: Home / Self Care | Payer: Medicare HMO | Source: Ambulatory Visit | Attending: Gastroenterology | Admitting: Gastroenterology

## 2019-01-11 DIAGNOSIS — K7460 Unspecified cirrhosis of liver: Secondary | ICD-10-CM

## 2019-01-13 ENCOUNTER — Other Ambulatory Visit (HOSPITAL_COMMUNITY): Payer: Self-pay | Admitting: Orthopedic Surgery

## 2019-01-13 DIAGNOSIS — T84032A Mechanical loosening of internal right knee prosthetic joint, initial encounter: Secondary | ICD-10-CM

## 2019-01-13 DIAGNOSIS — M25561 Pain in right knee: Secondary | ICD-10-CM | POA: Diagnosis not present

## 2019-01-13 DIAGNOSIS — M2550 Pain in unspecified joint: Secondary | ICD-10-CM | POA: Diagnosis not present

## 2019-01-20 ENCOUNTER — Encounter (HOSPITAL_COMMUNITY)
Admission: RE | Admit: 2019-01-20 | Discharge: 2019-01-20 | Disposition: A | Discharge status: Home / Self Care | Payer: Medicare HMO | Source: Ambulatory Visit | Attending: Orthopedic Surgery | Admitting: Orthopedic Surgery

## 2019-01-20 DIAGNOSIS — T84032A Mechanical loosening of internal right knee prosthetic joint, initial encounter: Secondary | ICD-10-CM | POA: Diagnosis not present

## 2019-01-20 DIAGNOSIS — Z96643 Presence of artificial hip joint, bilateral: Secondary | ICD-10-CM | POA: Diagnosis not present

## 2019-01-20 DIAGNOSIS — Z471 Aftercare following joint replacement surgery: Secondary | ICD-10-CM | POA: Diagnosis not present

## 2019-01-20 MED ORDER — TECHNETIUM TC 99M MEDRONATE IV KIT
20.0000 | PACK | Freq: Once | INTRAVENOUS | Status: AC | PRN
  Administered 2019-01-20: 20 via INTRAVENOUS

## 2019-01-25 DIAGNOSIS — J449 Chronic obstructive pulmonary disease, unspecified: Secondary | ICD-10-CM | POA: Diagnosis not present

## 2019-01-25 DIAGNOSIS — M25561 Pain in right knee: Secondary | ICD-10-CM | POA: Diagnosis not present

## 2019-01-25 DIAGNOSIS — M25461 Effusion, right knee: Secondary | ICD-10-CM | POA: Diagnosis not present

## 2019-02-03 DIAGNOSIS — S82091A Other fracture of right patella, initial encounter for closed fracture: Secondary | ICD-10-CM | POA: Diagnosis not present

## 2019-02-03 DIAGNOSIS — Z96651 Presence of right artificial knee joint: Secondary | ICD-10-CM | POA: Diagnosis not present

## 2019-02-06 DIAGNOSIS — J449 Chronic obstructive pulmonary disease, unspecified: Secondary | ICD-10-CM | POA: Diagnosis not present

## 2019-02-17 DIAGNOSIS — S82091A Other fracture of right patella, initial encounter for closed fracture: Secondary | ICD-10-CM | POA: Diagnosis not present

## 2019-02-21 DIAGNOSIS — S82001A Unspecified fracture of right patella, initial encounter for closed fracture: Secondary | ICD-10-CM | POA: Diagnosis not present

## 2019-02-21 DIAGNOSIS — M25569 Pain in unspecified knee: Secondary | ICD-10-CM | POA: Diagnosis not present

## 2019-02-21 DIAGNOSIS — M1711 Unilateral primary osteoarthritis, right knee: Secondary | ICD-10-CM | POA: Diagnosis not present

## 2019-02-23 DIAGNOSIS — J449 Chronic obstructive pulmonary disease, unspecified: Secondary | ICD-10-CM | POA: Diagnosis not present

## 2019-03-02 DIAGNOSIS — M25461 Effusion, right knee: Secondary | ICD-10-CM | POA: Diagnosis not present

## 2019-03-02 DIAGNOSIS — M25569 Pain in unspecified knee: Secondary | ICD-10-CM | POA: Diagnosis not present

## 2019-03-02 DIAGNOSIS — M25561 Pain in right knee: Secondary | ICD-10-CM | POA: Diagnosis not present

## 2019-03-02 DIAGNOSIS — S82001A Unspecified fracture of right patella, initial encounter for closed fracture: Secondary | ICD-10-CM | POA: Diagnosis not present

## 2019-03-02 DIAGNOSIS — S82091A Other fracture of right patella, initial encounter for closed fracture: Secondary | ICD-10-CM | POA: Diagnosis not present

## 2019-03-02 DIAGNOSIS — Z96653 Presence of artificial knee joint, bilateral: Secondary | ICD-10-CM | POA: Diagnosis not present

## 2019-03-07 DIAGNOSIS — S82091D Other fracture of right patella, subsequent encounter for closed fracture with routine healing: Secondary | ICD-10-CM | POA: Diagnosis not present

## 2019-03-07 DIAGNOSIS — J449 Chronic obstructive pulmonary disease, unspecified: Secondary | ICD-10-CM | POA: Diagnosis not present

## 2019-03-08 DIAGNOSIS — R079 Chest pain, unspecified: Secondary | ICD-10-CM | POA: Diagnosis not present

## 2019-03-08 DIAGNOSIS — S51811A Laceration without foreign body of right forearm, initial encounter: Secondary | ICD-10-CM | POA: Diagnosis not present

## 2019-03-08 DIAGNOSIS — R58 Hemorrhage, not elsewhere classified: Secondary | ICD-10-CM | POA: Diagnosis not present

## 2019-03-08 DIAGNOSIS — Z79899 Other long term (current) drug therapy: Secondary | ICD-10-CM | POA: Diagnosis not present

## 2019-03-08 DIAGNOSIS — R51 Headache: Secondary | ICD-10-CM | POA: Diagnosis not present

## 2019-03-08 DIAGNOSIS — S0990XA Unspecified injury of head, initial encounter: Secondary | ICD-10-CM | POA: Diagnosis not present

## 2019-03-08 DIAGNOSIS — S199XXA Unspecified injury of neck, initial encounter: Secondary | ICD-10-CM | POA: Diagnosis not present

## 2019-03-08 DIAGNOSIS — Z7982 Long term (current) use of aspirin: Secondary | ICD-10-CM | POA: Diagnosis not present

## 2019-03-08 DIAGNOSIS — W19XXXA Unspecified fall, initial encounter: Secondary | ICD-10-CM | POA: Diagnosis not present

## 2019-03-08 DIAGNOSIS — S0181XA Laceration without foreign body of other part of head, initial encounter: Secondary | ICD-10-CM | POA: Diagnosis not present

## 2019-03-08 DIAGNOSIS — S299XXA Unspecified injury of thorax, initial encounter: Secondary | ICD-10-CM | POA: Diagnosis not present

## 2019-03-09 DIAGNOSIS — R262 Difficulty in walking, not elsewhere classified: Secondary | ICD-10-CM | POA: Diagnosis not present

## 2019-03-09 DIAGNOSIS — Z79899 Other long term (current) drug therapy: Secondary | ICD-10-CM | POA: Diagnosis not present

## 2019-03-09 DIAGNOSIS — G894 Chronic pain syndrome: Secondary | ICD-10-CM | POA: Diagnosis not present

## 2019-03-09 DIAGNOSIS — Z5181 Encounter for therapeutic drug level monitoring: Secondary | ICD-10-CM | POA: Diagnosis not present

## 2019-03-23 ENCOUNTER — Telehealth: Payer: Self-pay

## 2019-03-23 NOTE — Telephone Encounter (Signed)
Called pt to set up e-visit in place of OV for 04/04/19 with Dr. Harrington Challenger. Pt agreed to have phone visit. I told her to be expecting a nurse to call and schedule.     Virtual Visit Pre-Appointment Phone Call  Steps For Call:  1. Confirm consent - "In the setting of the current Covid19 crisis, you are scheduled for a (phone or video) visit with your provider on (date) at (time).  Just as we do with many in-office visits, in order for you to participate in this visit, we must obtain consent.  If you'd like, I can send this to your mychart (if signed up) or email for you to review.  Otherwise, I can obtain your verbal consent now.  All virtual visits are billed to your insurance company just like a normal visit would be.  By agreeing to a virtual visit, we'd like you to understand that the technology does not allow for your provider to perform an examination, and thus may limit your provider's ability to fully assess your condition.  Finally, though the technology is pretty good, we cannot assure that it will always work on either your or our end, and in the setting of a video visit, we may have to convert it to a phone-only visit.  In either situation, we cannot ensure that we have a secure connection.  Are you willing to proceed?"  2. Give patient instructions for WebEx download to smartphone as below if video visit  3. Advise patient to be prepared with any vital sign or heart rhythm information, their current medicines, and a piece of paper and pen handy for any instructions they may receive the day of their visit  4. Inform patient they will receive a phone call 15 minutes prior to their appointment time (may be from unknown caller ID) so they should be prepared to answer  5. Confirm that appointment type is correct in Epic appointment notes (video vs telephone)    TELEPHONE CALL NOTE  [name] has been deemed a candidate for a follow-up tele-health visit to limit community exposure during  the Covid-19 pandemic. I spoke with the patient via phone to ensure availability of phone/video source, confirm preferred email & phone number, and discuss instructions and expectations.  I reminded [name] to be prepared with any vital sign and/or heart rhythm information that could potentially be obtained via home monitoring, at the time of her visit. I reminded [name] to expect a phone call at the time of her visit if her visit.  Did the patient verbally acknowledge consent to treatment? [name], [place] 03/23/2019 4:27 PM   DOWNLOADING THE [place]  - If Apple, go to [employer] and type in WebEx in the search bar. [place] [employer], the blue/green circle. The app is free but as with any other app downloads, their phone may require them to verify saved payment information or Apple password. The patient does NOT have to create an account.  - If Android, ask patient to go to [employer] and type in WebEx in the search bar. [place] [employer], the blue/green circle. The app is free but as with any other app downloads, their phone may require them to verify saved payment information or Android password. The patient does NOT have to create an account.   CONSENT FOR TELE-HEALTH VISIT - PLEASE REVIEW  I hereby voluntarily request, consent and authorize [place]  and its employed or contracted physicians, Engineer, materials, nurse practitioners or other licensed health care professionals (the Practitioner), to provide me with telemedicine health care services (the "Services") as deemed necessary by the treating Practitioner. I acknowledge and consent to receive the Services by the Practitioner via telemedicine. I understand that the telemedicine visit will involve communicating with the Practitioner through live audiovisual communication technology and the disclosure of certain medical information by electronic  transmission. I acknowledge that I have been given the opportunity to request an in-person assessment or other available alternative prior to the telemedicine visit and am voluntarily participating in the telemedicine visit.  I understand that I have the right to withhold or withdraw my consent to the use of telemedicine in the course of my care at any time, without affecting my right to future care or treatment, and that the Practitioner or I may terminate the telemedicine visit at any time. I understand that I have the right to inspect all information obtained and/or recorded in the course of the telemedicine visit and may receive copies of available information for a reasonable fee.  I understand that some of the potential risks of receiving the Services via telemedicine include:  Marland Kitchen Delay or interruption in medical evaluation due to technological equipment failure or disruption; . Information transmitted may not be sufficient (e.g. poor resolution of images) to allow for appropriate medical decision making by the Practitioner; and/or  . In rare instances, security protocols could fail, causing a breach of personal health information.  Furthermore, I acknowledge that it is my responsibility to provide information about my medical history, conditions and care that is complete and accurate to the best of my ability. I acknowledge that Practitioner's advice, recommendations, and/or decision may be based on factors not within their control, such as incomplete or inaccurate data provided by me or distortions of diagnostic images or specimens that may result from electronic transmissions. I understand that the practice of medicine is not an exact science and that Practitioner makes no warranties or guarantees regarding treatment outcomes. I acknowledge that I will receive a copy of this consent concurrently upon execution via email to the email address I last provided but may also request a printed copy by  calling the office of [place].    I understand that my insurance will be billed for this visit.   I have read or had this consent read to me. . I understand the contents of this consent, which adequately explains the benefits and risks of the Services being provided via telemedicine.  . I have been provided ample opportunity to ask questions regarding this consent and the Services and have had my questions answered to my satisfaction. . I give my informed consent for the services to be provided through the use of telemedicine in my medical care  By participating in this telemedicine visit I agree to the above.

## 2019-03-26 DIAGNOSIS — J449 Chronic obstructive pulmonary disease, unspecified: Secondary | ICD-10-CM | POA: Diagnosis not present

## 2019-03-28 DIAGNOSIS — S82091D Other fracture of right patella, subsequent encounter for closed fracture with routine healing: Secondary | ICD-10-CM | POA: Diagnosis not present

## 2019-03-29 ENCOUNTER — Telehealth: Payer: Self-pay

## 2019-03-29 NOTE — Telephone Encounter (Signed)
    Virtual Visit Pre-Appointment Phone Call  Steps For Call:  1. Confirm consent - "In the setting of the current Covid19 crisis, you are scheduled for a VIDEO visit with your provider on 4/13 at 10am.  Just as we do with many in-office visits, in order for you to participate in this visit, we must obtain consent.  If you'd like, I can send this to your mychart (if signed up) or email for you to review.  Otherwise, I can obtain your verbal consent now.  All virtual visits are billed to your insurance company just like a normal visit would be.  By agreeing to a virtual visit, we'd like you to understand that the technology does not allow for your provider to perform an examination, and thus may limit your provider's ability to fully assess your condition.  Finally, though the technology is pretty good, we cannot assure that it will always work on either your or our end, and in the setting of a video visit, we may have to convert it to a phone-only visit.  In either situation, we cannot ensure that we have a secure connection.  Are you willing to proceed?"  2. Give patient instructions for WebEx download to smartphone as below if video visit  3. Advise patient to be prepared with any vital sign or heart rhythm information, their current medicines, and a piece of paper and pen handy for any instructions they may receive the day of their visit  4. Inform patient they will receive a phone call 15 minutes prior to their appointment time (may be from unknown caller ID) so they should be prepared to answer  5. Confirm that appointment type is correct in Epic appointment notes (video vs telephone)    TELEPHONE CALL NOTE  [name] has been deemed a candidate for a follow-up tele-health visit to limit community exposure during the Covid-19 pandemic. I spoke with the patient via phone to ensure availability of phone/video source, confirm preferred email & phone number, and discuss instructions and  expectations.  I reminded [name] to be prepared with any vital sign and/or heart rhythm information that could potentially be obtained via home monitoring, at the time of her visit. I reminded [name] to expect a phone call at the time of her visit if her visit.  Did the patient verbally acknowledge consent to treatment? YES  Wilma Flavin, RN 03/29/2019 9:34 AM

## 2019-04-04 ENCOUNTER — Encounter: Payer: Self-pay | Admitting: Internal Medicine

## 2019-04-04 ENCOUNTER — Other Ambulatory Visit: Payer: Self-pay

## 2019-04-04 ENCOUNTER — Telehealth (INDEPENDENT_AMBULATORY_CARE_PROVIDER_SITE_OTHER): Payer: Medicare HMO | Admitting: Internal Medicine

## 2019-04-04 VITALS — BP 105/45 | HR 89 | Ht 68.0 in | Wt 172.0 lb

## 2019-04-04 DIAGNOSIS — I2510 Atherosclerotic heart disease of native coronary artery without angina pectoris: Secondary | ICD-10-CM

## 2019-04-04 DIAGNOSIS — E785 Hyperlipidemia, unspecified: Secondary | ICD-10-CM

## 2019-04-04 DIAGNOSIS — I5033 Acute on chronic diastolic (congestive) heart failure: Secondary | ICD-10-CM

## 2019-04-04 DIAGNOSIS — I5032 Chronic diastolic (congestive) heart failure: Secondary | ICD-10-CM

## 2019-04-04 DIAGNOSIS — I110 Hypertensive heart disease with heart failure: Secondary | ICD-10-CM

## 2019-04-04 MED ORDER — FUROSEMIDE 40 MG PO TABS: ORAL_TABLET | ORAL | 3 refills | Status: DC

## 2019-04-04 NOTE — Patient Instructions (Addendum)
Medication Instructions:  Your physician has recommended you make the following change in your medication: Change furosemide (LASIX) to:  On Tue and Sat take 2 tablets once a day (80 mg).  Continue with 1 1/2 tablets (60 mg) all other days.  Please call or use MyChart to update Dr. Harrington Challenger on swelling/weights.  If you need a refill on your cardiac medications before your next appointment, please call your pharmacy.   Lab work: none If you have labs (blood work) drawn today and your tests are completely normal, you will receive your results only by: Marland Kitchen MyChart Message (if you have MyChart) OR . A paper copy in the mail If you have any lab test that is abnormal or we need to change your treatment, we will call you to review the results.  Testing/Procedures: none  Follow-Up: At [facility], you and your health needs are our priority.  As part of our continuing mission to provide you with exceptional heart care, we have created designated Provider Care Teams.  These Care Teams include your primary Cardiologist (physician) and Advanced Practice Providers (APPs -  Physician Assistants and Nurse Practitioners) who all work together to provide you with the care you need, when you need it. We will arrange for another virtual visit in about 6 weeks.  We will call you to arrange date/time for this visit.  Any Other Special Instructions Will Be Listed Below (If Applicable).

## 2019-04-04 NOTE — Progress Notes (Signed)
Virtual Visit via Telephone Note   This visit type was conducted due to national recommendations for restrictions regarding the COVID-19 Pandemic (e.g. social distancing) in an effort to limit this patient's exposure and mitigate transmission in our community.  Due to her co-morbid illnesses, this patient is at least at moderate risk for complications without adequate follow up.  This format is felt to be most appropriate for this patient at this time.  The patient did not have access to video technology/had technical difficulties with video requiring transitioning to audio format only (telephone).  All issues noted in this document were discussed and addressed.  No physical exam could be performed with this format.  Please refer to the patient's chart for her  consent to telehealth for [facility].   Evaluation Performed:  Follow-up visit  Date:  04/04/2019   ID:  [name], DOB 09-25-1941, MRN [id number]  Patient Location: Home  Provider Location: Home  PCP:  [name], MD  Cardiologist:  Dorris Carnes, MD Electrophysiologist:  None   Chief Complaint: F/U of edema    History of Present Illness:    [name] is a 78 y.o. female who presents via audio/video conferencing for a telehealth visit today.    The pt has a history of CAD, atrial fib, GI bleeding    In Dec 2019 Lasix was increased due to LE edema.    Cr increased and aldactone was decreased to every other day     The pt was admitted to [facility] on 12/23/18 to 12/24/18 for edema, SOB, mild chest pressure.   TSH elevated    Lasix given   She was seen on 12/30/18 by Buren Kos   Breating back to normal  Edema improved but still swollen  Since seen she says her ankles stay swollen  Same as whe ALl the time Breathing is pretty good    Having some chest pain Occasional   DOing good with that Has cut back on salt   Wt is 172   Better at [place] and got hurt bad    Went to make coffee   [place].   Happened 3 wks ago    Knee  cap broken   Went to [facility]  LIves by herselft   Son checks on her daily  The patient does not have symptoms concerning for COVID-19 infection (fever, chills, cough, or new shortness of breath).    Past Medical History:  Diagnosis Date  . Anemia    bld. transfusion post lumbar surgery- 2012  . Anxiety   . Arthralgia    NOS  . Blood transfusion 2012; 02/2018   WITH BACK SURGERY; "related to hematoma in stomach" (10/06/2018)  . CAD (coronary artery disease)    s/p CABG 2004; s/p DES to LM in 2010;  [facility] 10/29/11: EF 50-55%, mild elevated filling pressures, no pulmonary HTN, LM 90% ISR, LAD and CFX occluded, S-RI occluded (old), S-OM3 ok and L-LAD ok, native nondominant RCA 95% -  med rx recommended ; Lexiscan Myoview 7/13 at [facility]: demonstrated "normal LV function, anterior attenuation and localized ischemia, inferior, basilar, mid section"  . Carotid artery disease ([place])    Carotid US 7/74:  RICA 1-28; LICA 78-67; R subclavian stenosis - Repeat 1 year.  . CHF (congestive heart failure) ([place])   . Chronic diastolic heart failure (HCC)    Echo 9/10: EF 67-20%, grade 1 diastolic dysfunction  . Chronic lower back pain   .  COPD (chronic obstructive pulmonary disease) ([place])    Emphysema dxed by Dr. Woody Seller in [place] based on PFTs per pt in 2006; placed on albuterol  . Depression    TAKES CELEXA  AND  (OFF- WELBUTRIN)  . Diuretic-induced hypokalemia 10/08/2018  . DVT of lower extremity (deep venous thrombosis) (HCC)    recurrent. bilateral (2 episodes)  . Dyspnea   . Dysrhythmia    afib with cabg  . Exertional angina (HCC)    Treated with Isosorbide, Ranexa, amlodipine; intolerant to metoprolol  . GERD (gastroesophageal reflux disease)   . Gout    "on daily RX" (10/06/2018)  . HLD (hyperlipidemia)   . Hypertension   . Hyperthyroidism   . Obesity (BMI 30-39.9) 2009   BMI 33  . Osteoarthritis    "all over; hands" (10/06/2018)  . Oxygen deficiency   . Oxygen  dependent    4L at home as needed (10/06/2018)  . Pneumonia   . PVD (peripheral vascular disease) ([place])   . Sleep apnea 2012   USED CPAP THEN  STARTED USING SPIRIVA , Nov. 2013- last evaluation , changed from mask to aparatus that is just for her nose.. ; reports 05-28-18 "the mask smothers me so i dont use it right now" (10/06/2018)   Past Surgical History:  Procedure Laterality Date  . ANKLE SURGERY  2004  . ANTERIOR CERVICAL DECOMP/DISCECTOMY FUSION  11/2007  . AUGMENTATION MAMMAPLASTY    . [place]   lower; another scheduled, opt. reports 4 back- lumbar, 3 cerv. fusions  for later 2009  . CARDIAC CATHETERIZATION  11/2009   Patent LIMA to LAD and patent SVG to OM1. Occluded SVG to ramus and diagonal. Left main: 90% ostial stenosis, LCX 60-70% proximal stenosis  . CATARACT EXTRACTION W/ INTRAOCULAR LENS  IMPLANT, BILATERAL Bilateral 2006  . COLONOSCOPY WITH PROPOFOL N/A 02/17/2017   Procedure: COLONOSCOPY WITH PROPOFOL;  Surgeon: Jerene Bears, MD;  Location: [facility] ENDOSCOPY;  Service: Endoscopy;  Laterality: N/A;  . CORONARY ANGIOPLASTY WITH STENT PLACEMENT  11/2009   Drug eluting stent to left main artery: 4.0 X 12 mm Ion   . CORONARY ARTERY BYPASS GRAFT  2004   "CABG X4"  . ESOPHAGOGASTRODUODENOSCOPY N/A 02/14/2017   Procedure: ESOPHAGOGASTRODUODENOSCOPY (EGD);  Surgeon: Doran Stabler, MD;  Location: [facility] ENDOSCOPY;  Service: Endoscopy;  Laterality: N/A;  . GIVENS CAPSULE STUDY N/A 02/15/2017   Procedure: GIVENS CAPSULE STUDY;  Surgeon: Doran Stabler, MD;  Location: [place];  Service: Endoscopy;  Laterality: N/A;  . GROIN MASS OPEN BIOPSY  2004  . IR IVC FILTER PLMT / S&I /IMG GUID/MOD SED  03/14/2018  . IR PARACENTESIS  03/10/2018  . JOINT REPLACEMENT    . LAPAROSCOPIC CHOLECYSTECTOMY    . PERIPHERAL VASCULAR INTERVENTION Left 03/05/2018   Procedure: PERIPHERAL VASCULAR INTERVENTION;  Surgeon: Elam Dutch, MD;  Location: [place] CV LAB;  Service:  Cardiovascular;  Laterality: Left;  Attempted unsuccess\ful Per Dr. Eden Lathe  . PERIPHERAL VASCULAR INTERVENTION  03/08/2018   Procedure: PERIPHERAL VASCULAR INTERVENTION;  Surgeon: Waynetta Sandy, MD;  Location: [place] CV LAB;  Service: Cardiovascular;;  SMA Stent   . REPLACEMENT TOTAL KNEE BILATERAL Bilateral 2012  . REVERSE SHOULDER ARTHROPLASTY  02/26/2012   Procedure: REVERSE SHOULDER ARTHROPLASTY;  Surgeon: Marin Shutter, MD;  Location: [place];  Service: Orthopedics;  Laterality: Right;  RIGHT SHOULDER REVERSED ARTHROPLASTY  . SHOULDER ARTHROSCOPY WITH SUBACROMIAL DECOMPRESSION Left 02/10/2013   Procedure: SHOULDER ARTHROSCOPY  WITH SUBACROMIAL DECOMPRESSION DISTAL CLAVICLE RESECTION;  Surgeon: Marin Shutter, MD;  Location: [place];  Service: Orthopedics;  Laterality: Left;  DISTAL CLAVICLE RESECTION  . TONSILLECTOMY    . TOTAL HIP ARTHROPLASTY  2007   Right  . TRANSURETHRAL RESECTION OF BLADDER TUMOR N/A 05/31/2018   Procedure: TRANSURETHRAL RESECTION OF BLADDER TUMOR (TURBT);  Surgeon: Lucas Mallow, MD;  Location: [facility];  Service: Urology;  Laterality: N/A;  . VISCERAL ANGIOGRAPHY N/A 03/05/2018   Procedure: VISCERAL ANGIOGRAPHY;  Surgeon: Elam Dutch, MD;  Location: [place] CV LAB;  Service: Cardiovascular;  Laterality: N/A;  . VISCERAL ANGIOGRAPHY N/A 03/08/2018   Procedure: VISCERAL ANGIOGRAPHY;  Surgeon: Waynetta Sandy, MD;  Location: [place] CV LAB;  Service: Cardiovascular;  Laterality: N/A;     Current Meds  Medication Sig  . allopurinol (ZYLOPRIM) 100 MG tablet Take 100 mg by mouth daily.  Marland Kitchen aspirin EC 81 MG tablet Take 81 mg by mouth daily.  . Aspirin-Salicylamide-Caffeine (BC HEADACHE POWDER PO) Take 1 packet by mouth as needed (for headaches).  . citalopram (CELEXA) 20 MG tablet Take 30 mg by mouth at bedtime.   . CVS D3 1000 units capsule Take 1,000 Units by mouth daily.  . cyclobenzaprine (FLEXERIL) 10 MG tablet Take 10 mg by mouth 3  (three) times daily as needed for muscle spasms.   . ferrous sulfate 325 (65 FE) MG tablet Take 325 mg by mouth daily with breakfast.  . furosemide (LASIX) 40 MG tablet Take 1.5 tablets (60 mg total) by mouth daily.  Marland Kitchen HYDROcodone-acetaminophen (NORCO) 5-325 MG tablet Take 1 tablet by mouth every 4 (four) hours as needed for moderate pain.  . INCRUSE ELLIPTA 62.5 MCG/INH AEPB Inhale 1 puff into the lungs 2 (two) times daily.   . Ipratropium-Albuterol (COMBIVENT) 20-100 MCG/ACT AERS respimat Inhale 1 puff into the lungs every 4 (four) hours as needed for wheezing or shortness of breath (or to help expel mucous).   Marland Kitchen levothyroxine (SYNTHROID, LEVOTHROID) 25 MCG tablet Take 1.5 tablets (37.5 mcg total) by mouth daily before breakfast.  . magnesium oxide (MAG-OX) 400 (241.3 Mg) MG tablet Take 1 tablet (400 mg total) by mouth 2 (two) times daily.  . Melatonin 10 MG TABS Take 30 mg by mouth at bedtime.  . nitroGLYCERIN (NITROSTAT) 0.4 MG SL tablet Place 1 tablet (0.4 mg total) under the tongue every 5 (five) minutes as needed for chest pain. For chest pain  . omeprazole (PRILOSEC) 40 MG capsule Take 40 mg by mouth at bedtime.   . Oxycodone HCl 10 MG TABS Take 10 mg by mouth every 6 (six) hours as needed (FOR PAIN).   Marland Kitchen pilocarpine (PILOCAR) 1 % ophthalmic solution Place 1 drop into both eyes 2 (two) times daily.  . potassium chloride (K-DUR,KLOR-CON) 10 MEQ tablet Take 2 tablets (20 mEq total) by mouth daily.  . pramipexole (MIRAPEX) 0.25 MG tablet Take 0.25 mg by mouth at bedtime.  . roflumilast (DALIRESP) 500 MCG TABS tablet Take 500 mcg by mouth every evening.   . traZODone (DESYREL) 50 MG tablet Take 50 mg by mouth at bedtime.      Allergies:   Ativan [lorazepam]; Pitavastatin; Ropinirole; Zofran [ondansetron hcl]; Zolpidem tartrate; and Penicillins   Social History   Tobacco Use  . Smoking status: Former Smoker    Packs/day: 1.00    Years: 45.00    Pack years: 45.00    Types: Cigarettes     Last attempt to quit:  12/22/2000    Years since quitting: 18.2  . Smokeless tobacco: Never Used  Substance Use Topics  . Alcohol use: Never    Alcohol/week: 0.0 standard drinks    Frequency: Never  . Drug use: Never     Family Hx: The patient's family history includes COPD in her father; Cancer in her mother, sister, and another family member; Colitis in an other family member; Heart disease in her father, sister, and another family member; Lung cancer in her father; Ovarian cancer in her mother.  ROS:   Please see the history of present illness.     All other systems reviewed and are negative.   Prior CV studies:   The following studies were reviewed today: None Labs/Other Tests and Data Reviewed:    EKG:  Not done    Recent Labs: 10/19/2018: Magnesium 2.1 11/01/2018: NT-Pro BNP 601 12/23/2018: B Natriuretic Peptide 102.8 12/24/2018: ALT 10; Hemoglobin 11.8; Platelets 249; TSH 5.786 12/25/2018: BUN 19; Creatinine, Ser 1.11; Potassium 4.3; Sodium 138   Recent Lipid Panel Lab Results  Component Value Date/Time   CHOL 106 07/01/2018 08:00 AM   CHOL 98 05/23/2014 08:57 AM   TRIG 88 07/01/2018 08:00 AM   TRIG 83 05/23/2014 08:57 AM   HDL 38 (L) 07/01/2018 08:00 AM   HDL 35 (L) 05/23/2014 08:57 AM   CHOLHDL 2.8 07/01/2018 08:00 AM   CHOLHDL 4 03/03/2013 11:36 AM   LDLCALC 50 07/01/2018 08:00 AM   LDLCALC 46 05/23/2014 08:57 AM    Wt Readings from Last 3 Encounters:  04/04/19 172 lb (78 kg)  12/30/18 165 lb 1.9 oz (74.9 kg)  12/25/18 164 lb 3.2 oz (74.5 kg)     Objective:    Vital Signs:  BP (!) 105/45   Pulse 89   Ht 5\' 8"  (1.727 m)   Wt 172 lb (78 kg)   BMI 26.15 kg/m    Not done as phone call.  ASSESSMENT & PLAN:    1. Edema (acute on chronic diastolic CHF )   Breathing is better   She says her wt is up about 15 lbs   Still with edema Says she is watching salt    Recom:   Increase lasix to 2 tabs on Tues and Saturaday   Tale 60 mg other days Call early  next week with response, wts edema     2  CAD   No symptoms of angina  3   HTN   BP is OK   Denies dizziness  4   Hx PE/DVT   Has IVC filter  5  Hypothroidism   Will checlk later this spring    COVID-19 Education: The signs and symptoms of COVID-19 were discussed with the patient and how to seek care for testing (follow up with PCP or arrange E-visit).  The importance of social distancing was discussed today.  Time:   Today, I have spent 30 minutes with the patient with telehealth technology discussing the above problems.     Medication Adjustments/Labs and Tests Ordered: Current medicines are reviewed at length with the patient today.  Concerns regarding medicines are outlined above.  Tests Ordered: No orders of the defined types were placed in this encounter.  Medication Changes: No orders of the defined types were placed in this encounter.   Disposition:  Follow up end of may with televisit   Pt to call with response to lasix adjustment next week   Signed, Dorris Carnes, MD  04/04/2019 10:08 AM  [facility]

## 2019-04-07 DIAGNOSIS — J449 Chronic obstructive pulmonary disease, unspecified: Secondary | ICD-10-CM | POA: Diagnosis not present

## 2019-04-22 DIAGNOSIS — F411 Generalized anxiety disorder: Secondary | ICD-10-CM | POA: Diagnosis not present

## 2019-04-22 DIAGNOSIS — F332 Major depressive disorder, recurrent severe without psychotic features: Secondary | ICD-10-CM | POA: Diagnosis not present

## 2019-04-25 DIAGNOSIS — J449 Chronic obstructive pulmonary disease, unspecified: Secondary | ICD-10-CM | POA: Diagnosis not present

## 2019-05-03 NOTE — Telephone Encounter (Signed)
error 

## 2019-05-07 DIAGNOSIS — J449 Chronic obstructive pulmonary disease, unspecified: Secondary | ICD-10-CM | POA: Diagnosis not present

## 2019-05-19 ENCOUNTER — Other Ambulatory Visit: Payer: Self-pay | Admitting: Physician Assistant

## 2019-05-20 ENCOUNTER — Telehealth: Payer: Self-pay | Admitting: Internal Medicine

## 2019-05-20 NOTE — Telephone Encounter (Signed)
New Message   Pt c/o medication issue:   1. Name of Medication:  Atorvastatin Calcium 20 MG TAKE 1 TABLET BY MOUTH DAILY.    2. How are you currently taking this medication (dosage and times per day)?   3. Are you having a reaction (difficulty breathing--STAT)?   4. What is your medication issue? Pharmacy is calling in reference to patient medication. She is wanting to know when this medication was stopped. Please call to discuss.

## 2019-05-24 NOTE — Telephone Encounter (Signed)
Spoke with [employer]. They make pill packs for patient with all medicines.  Wanted to know if atorvastatin was changed to another medication. Reviewed chart. Pt seen Jan and was on atorva 20 mg daily For May visit with Dr. Harrington Challenger, medication was deleted due to pt not taking. --however the medication was in the pill pack. Last LDL in July 2019 was in 66.  Pharmacy is aware I am forwarding to Dr. Harrington Challenger to make aware.  For now they are leaving atorvastatin out of the pill packs.

## 2019-05-26 ENCOUNTER — Telehealth: Payer: Self-pay | Admitting: Internal Medicine

## 2019-05-26 DIAGNOSIS — J449 Chronic obstructive pulmonary disease, unspecified: Secondary | ICD-10-CM | POA: Diagnosis not present

## 2019-05-26 MED ORDER — ATORVASTATIN CALCIUM 10 MG PO TABS: 10.0000 mg | ORAL_TABLET | Freq: Every day | ORAL | 3 refills | Status: DC

## 2019-05-26 NOTE — Telephone Encounter (Signed)
lpmtcb 6/4

## 2019-05-26 NOTE — Telephone Encounter (Signed)
New Message ° ° °Patient returning your call. °

## 2019-05-26 NOTE — Telephone Encounter (Signed)
It would be good if she took at least lipitor 10 mg per day   F/U lipids and AST in 4 month

## 2019-05-26 NOTE — Telephone Encounter (Signed)
I spoke to the patient's daughter with Dr Harrington Challenger' recommendations.  We are starting Lipitor 10 mg daily and labs in 4 months. Lipid/AST

## 2019-06-01 ENCOUNTER — Other Ambulatory Visit: Payer: Self-pay | Admitting: Physician Assistant

## 2019-06-01 ENCOUNTER — Ambulatory Visit (HOSPITAL_COMMUNITY)
Admission: RE | Admit: 2019-06-01 | Payer: Medicare HMO | Source: Ambulatory Visit | Attending: Physician Assistant | Admitting: Physician Assistant

## 2019-06-01 DIAGNOSIS — I6523 Occlusion and stenosis of bilateral carotid arteries: Secondary | ICD-10-CM

## 2019-06-06 ENCOUNTER — Other Ambulatory Visit (HOSPITAL_COMMUNITY): Payer: Self-pay | Admitting: Physician Assistant

## 2019-06-06 ENCOUNTER — Ambulatory Visit (HOSPITAL_COMMUNITY)
Admission: RE | Admit: 2019-06-06 | Discharge: 2019-06-06 | Disposition: A | Discharge status: Home / Self Care | Payer: Medicare HMO | Source: Ambulatory Visit | Attending: Cardiology | Admitting: Cardiology

## 2019-06-06 ENCOUNTER — Encounter: Payer: Self-pay | Admitting: Physician Assistant

## 2019-06-06 ENCOUNTER — Other Ambulatory Visit: Payer: Self-pay

## 2019-06-06 DIAGNOSIS — I6523 Occlusion and stenosis of bilateral carotid arteries: Secondary | ICD-10-CM

## 2019-06-08 ENCOUNTER — Telehealth: Payer: Self-pay | Admitting: Internal Medicine

## 2019-06-08 DIAGNOSIS — I6523 Occlusion and stenosis of bilateral carotid arteries: Secondary | ICD-10-CM

## 2019-06-08 NOTE — Telephone Encounter (Signed)
New message   Patient is calling for test of the carotid artery. Pleas call.

## 2019-06-08 NOTE — Telephone Encounter (Signed)
Pt has been notified of carotid results by phone with verbal understanding. Pt is agreeable to repeat carotids in 1 yr. Pt thanked me for the call. I will place order for carotids to be done in 1 yr.

## 2019-06-15 DIAGNOSIS — F330 Major depressive disorder, recurrent, mild: Secondary | ICD-10-CM | POA: Diagnosis not present

## 2019-06-15 DIAGNOSIS — G894 Chronic pain syndrome: Secondary | ICD-10-CM | POA: Diagnosis not present

## 2019-06-25 DIAGNOSIS — J449 Chronic obstructive pulmonary disease, unspecified: Secondary | ICD-10-CM | POA: Diagnosis not present

## 2019-07-26 DIAGNOSIS — J449 Chronic obstructive pulmonary disease, unspecified: Secondary | ICD-10-CM | POA: Diagnosis not present

## 2019-07-28 ENCOUNTER — Other Ambulatory Visit: Payer: Self-pay | Admitting: *Deleted

## 2019-07-28 NOTE — Patient Outreach (Addendum)
[facility]) Care Management  07/28/2019  [name] 08-Dec-1941 [id number]   Care coordination  [facility] RN CM received a voice message left by the patient on 07/25/19 at 1811 It stated she was trying to reach "Sister Judson Roch" and wanted "a return call " [facility] RN CM returned a call to assess if she need assist  She confirms she left a message in error and did not need assist from [place]. Lavina Hamman, RN, BSN, [place] Coordinator Office number [phone] Mobile number [phone]  Main [facility] number [phone] Fax number [phone]

## 2019-08-18 IMAGING — CT CT CTA ABD/PEL W/CM AND/OR W/O CM
2 of 11 series · 13 of 46 positions shown, 15 images · IV contrast (APPLIED)
Comparison: Abdomen/pelvis CT 02/28/2018

CLINICAL DATA: Upper abdominal pain and nausea after mesenteric
stent placement today.

EXAM:
CTA ABDOMEN AND PELVIS wITHOUT AND WITH CONTRAST
TECHNIQUE: Multidetector CT imaging of the abdomen and pelvis was performed
using the standard protocol during bolus administration of
intravenous contrast. Multiplanar reconstructed images and MIPs were
obtained and reviewed to evaluate the vascular anatomy.
CONTRAST:  100 cc Isovue 370 IV

[Series 6: arterial thins · axial · arterial · 0.70mm/px · z∈[+875,+1265]mm · 11 of 1121 slices shown, 13 images]
[im 98/1121  soft-tissue]
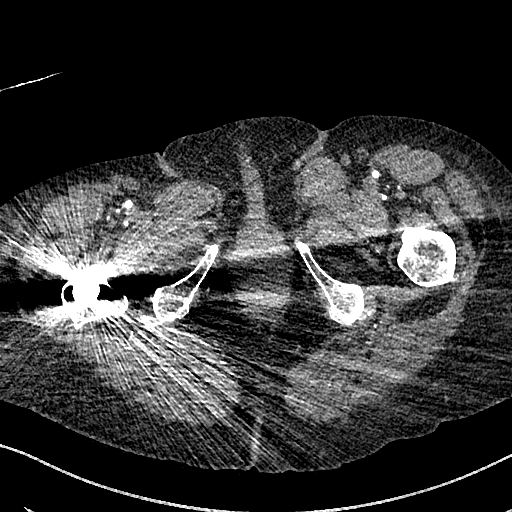
[im 98/1121  bone]
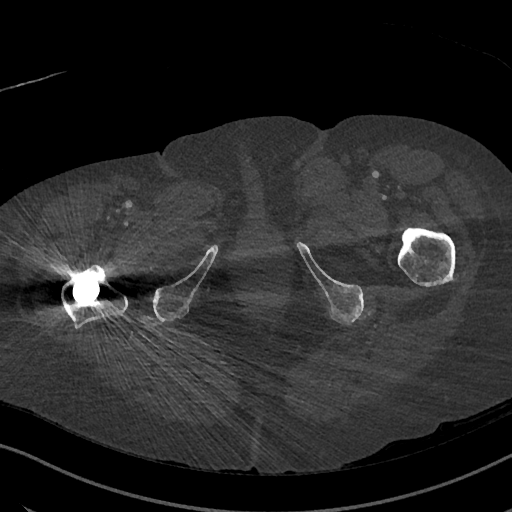
[im 244/1121  soft-tissue]
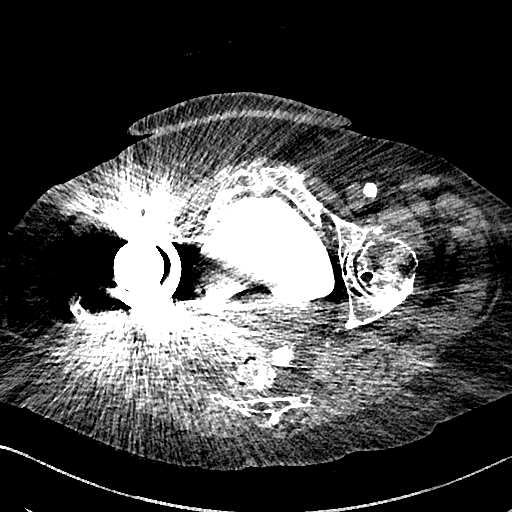
[im 341/1121  soft-tissue]
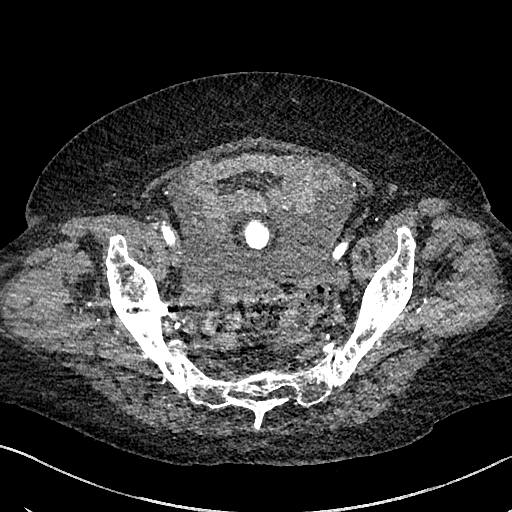
[im 487/1121  soft-tissue]
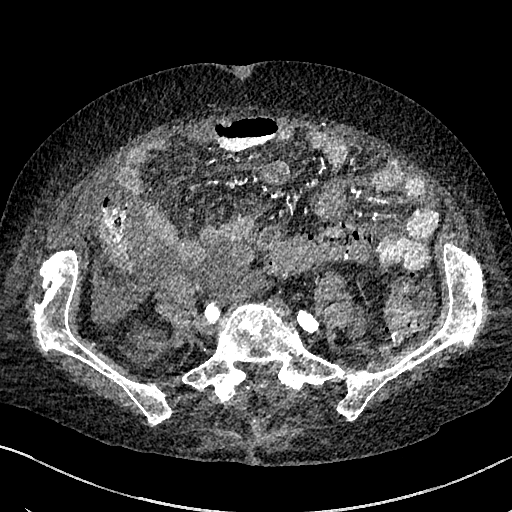
[im 634/1121  soft-tissue]
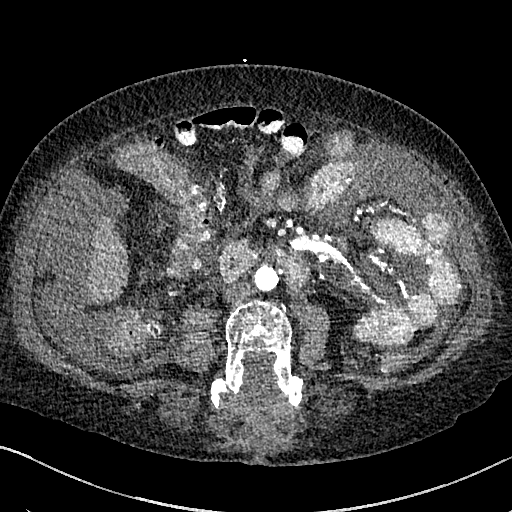
[im 780/1121  soft-tissue]
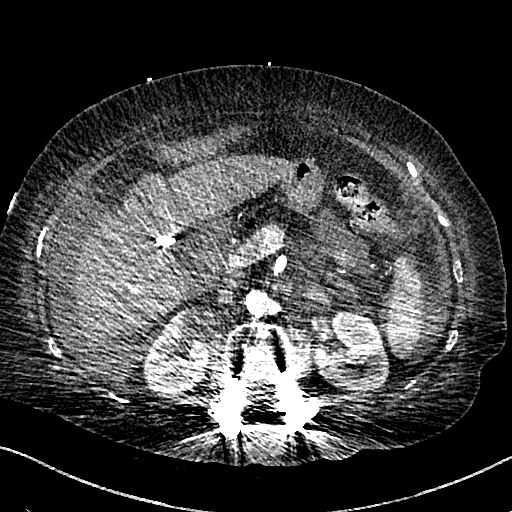
[im 877/1121  soft-tissue]
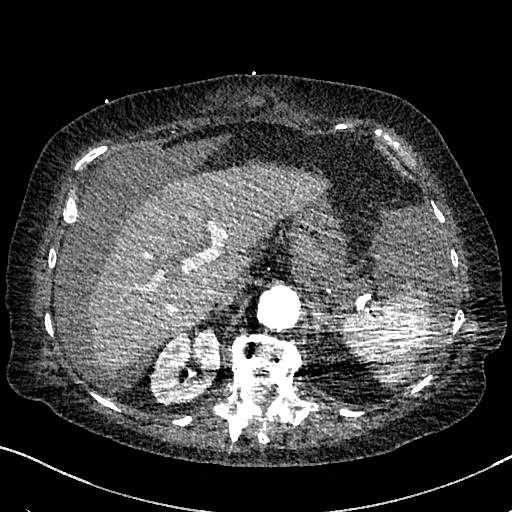
[im 926/1121  lung]
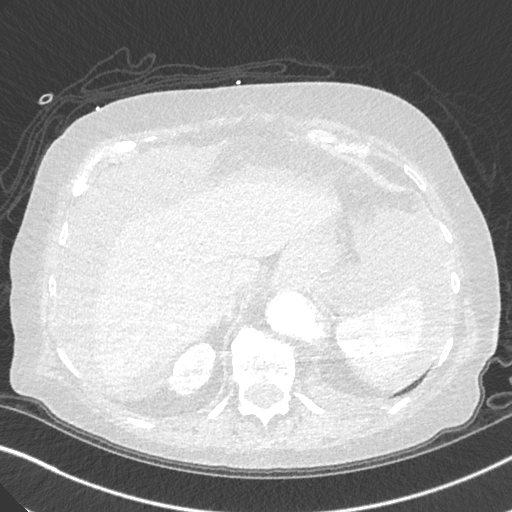
[im 974/1121  lung]
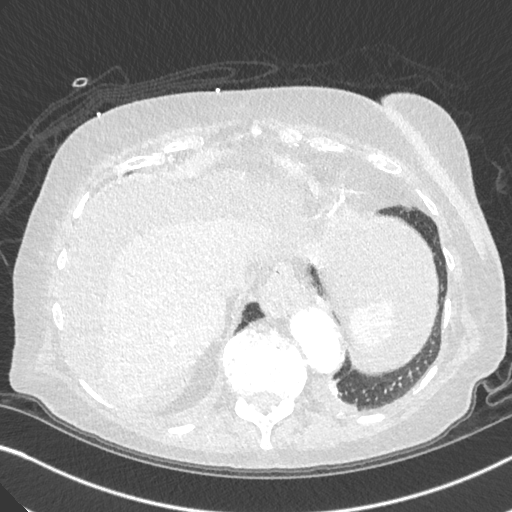
[im 1023/1121  soft-tissue]
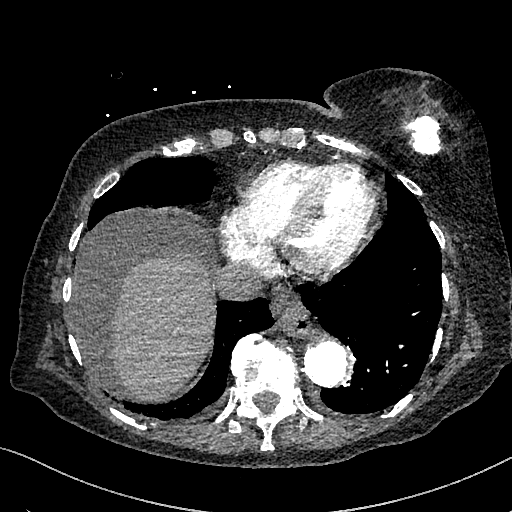
[im 1023/1121  lung]
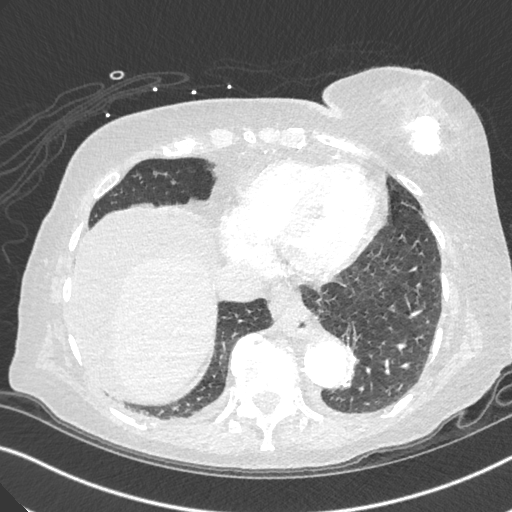
[im 1072/1121  lung]
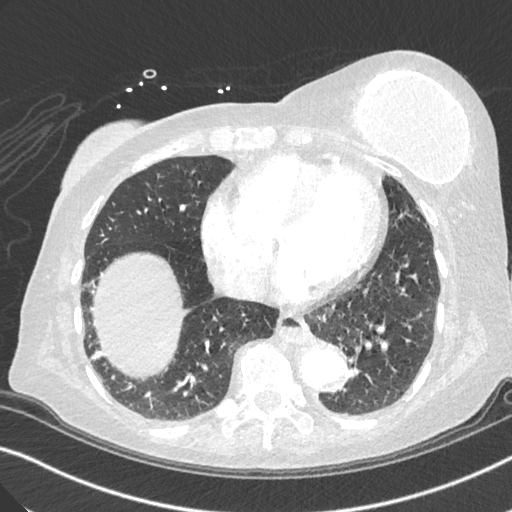

[Series 8: arterial cor · coronal · arterial · 0.73mm/px · 2 of 140 slices shown]
[im 47/140  soft-tissue]
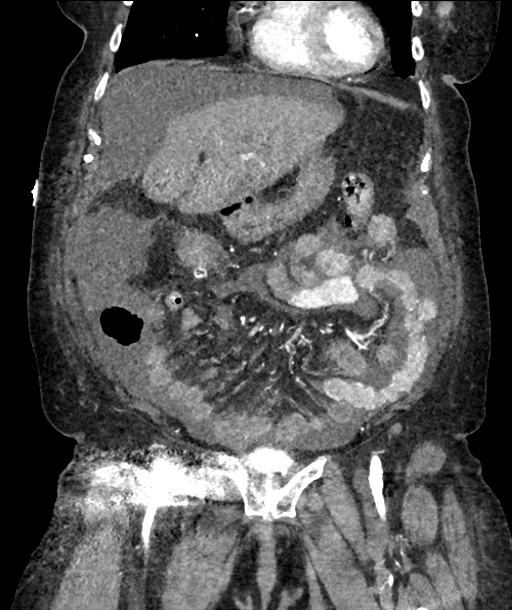
[im 93/140  soft-tissue]
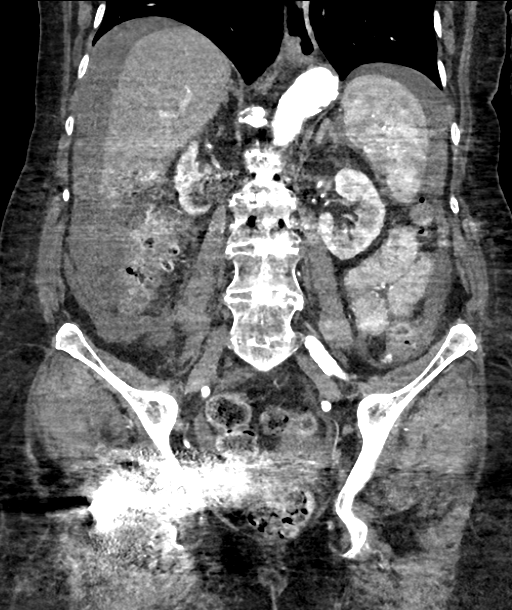

[13 of 46 positions shown; findings below may reference images not displayed]

FINDINGS: VASCULAR

Aorta: Moderate atherosclerosis. Normal caliber aorta without
aneurysm, dissection, vasculitis or significant stenosis.

Celiac: Plaque at the origin causes approximately 50% stenosis. No
dissection or vasculitis. Distal branches are patent.

SMA: Patent proximal SMA stent with resolved origin stenosis.
Calcified distally but patent. No evidence of dissection. No
evidence of contrast extravasation.

Renals: Calcified plaque at the origins. No dissection or
vasculitis.

IMA: Patent without evidence of aneurysm, dissection, vasculitis or
significant stenosis.

Inflow: Patent without evidence of aneurysm, dissection, vasculitis
or significant stenosis.

Proximal Outflow: Bilateral common femoral and visualized portions
of the superficial and profunda femoral arteries are patent without
evidence of aneurysm, dissection, vasculitis or significant
stenosis. No evidence of pseudoaneurysm formation or active
extravasation at the puncture site.

Veins: Not well assessed on arterial phase imaging. There is
presumed mixing of opacified and non-opacified blood in the
mesenteric venous confluence.

Review of the MIP images confirms the above findings.

NON-VASCULAR

Lower chest: Linear atelectasis in both lower lobes. Trace right
pleural effusion. Coronary artery calcifications. Peripherally
calcified left breast implant partially included.

Hepatobiliary: Slight capsular nodularity raises concern for
cirrhosis. No discrete focal lesion. Postcholecystectomy with
grossly unchanged biliary prominence allowing for arterial phase
technique.

Pancreas: Parenchymal atrophy. No ductal dilatation or inflammation.

Spleen: Normal in size without focal abnormality.

Adrenals/Urinary Tract: Mild left adrenal thickening. Normal right
adrenal gland. Excreted IV contrast in both kidneys and urinary
bladder from angiogram earlier this day. No hydronephrosis or
perinephric edema. Bilateral renal cysts.

Stomach/Bowel: Small hiatal hernia. Stomach is nondistended.
Presence of intra-abdominal ascites limits bowel evaluation. There
is colonic wall thickening involving the ascending colon, hepatic
flexure, unlikely proximal transverse colon. No definite small bowel
inflammation. Colonic diverticulosis throughout the colon.

Lymphatic: No definite adenopathy. No evidence of retroperitoneal
hemorrhage.

Reproductive: Uterus obscured by right hip arthroplasty.

Other: Development of moderate volume abdominopelvic ascites. Fluid
may be minimally complex, however does not represent values
concerning for hemorrhage. There is mild mesenteric edema. No free
air. Small supraumbilical hernia contains fat and small amount of
free fluid.

Musculoskeletal: Postsurgical change in the lumbar spine, unchanged
from prior. Right hip arthroplasty.
IMPRESSION: VASCULAR

1. Placement of superior mesenteric artery stent which is widely
patent. No evidence of dissection or postprocedural
pseudoaneurysm/hematoma.
2. Diffuse aortic and branch atherosclerosis.

NON-VASCULAR

1. Interval development of moderate abdominopelvic ascites from
prior exam. Fluid is minimally complex but does not represent
hemorrhage.
2. Colonic wall thickening involving the cecum, ascending, and
proximal transverse colon. This is new since prior CT. Exact
etiology is uncertain. Infectious or inflammatory etiologies are
considered, colonic wall edema related to reperfusion could have a
similar appearance. No increased density to suggest bowel wall
hemorrhage.
3. Colonic diverticulosis without specific findings to suggest
diverticulitis.
4. Question of nodular hepatic contours raises concern for
cirrhosis. Recommend correlation with cirrhosis risk factors.

## 2019-08-21 IMAGING — DX DG ABDOMEN 2V
3 series · 3 of 3 positions shown · non-contrast
Comparison: 03/08/2018

CLINICAL DATA: Lower abdominal pain beginning this morning post
vascular surgery

EXAM:
ABDOMEN - 2 VIEW

[x abdomen supine (1 of 2)]
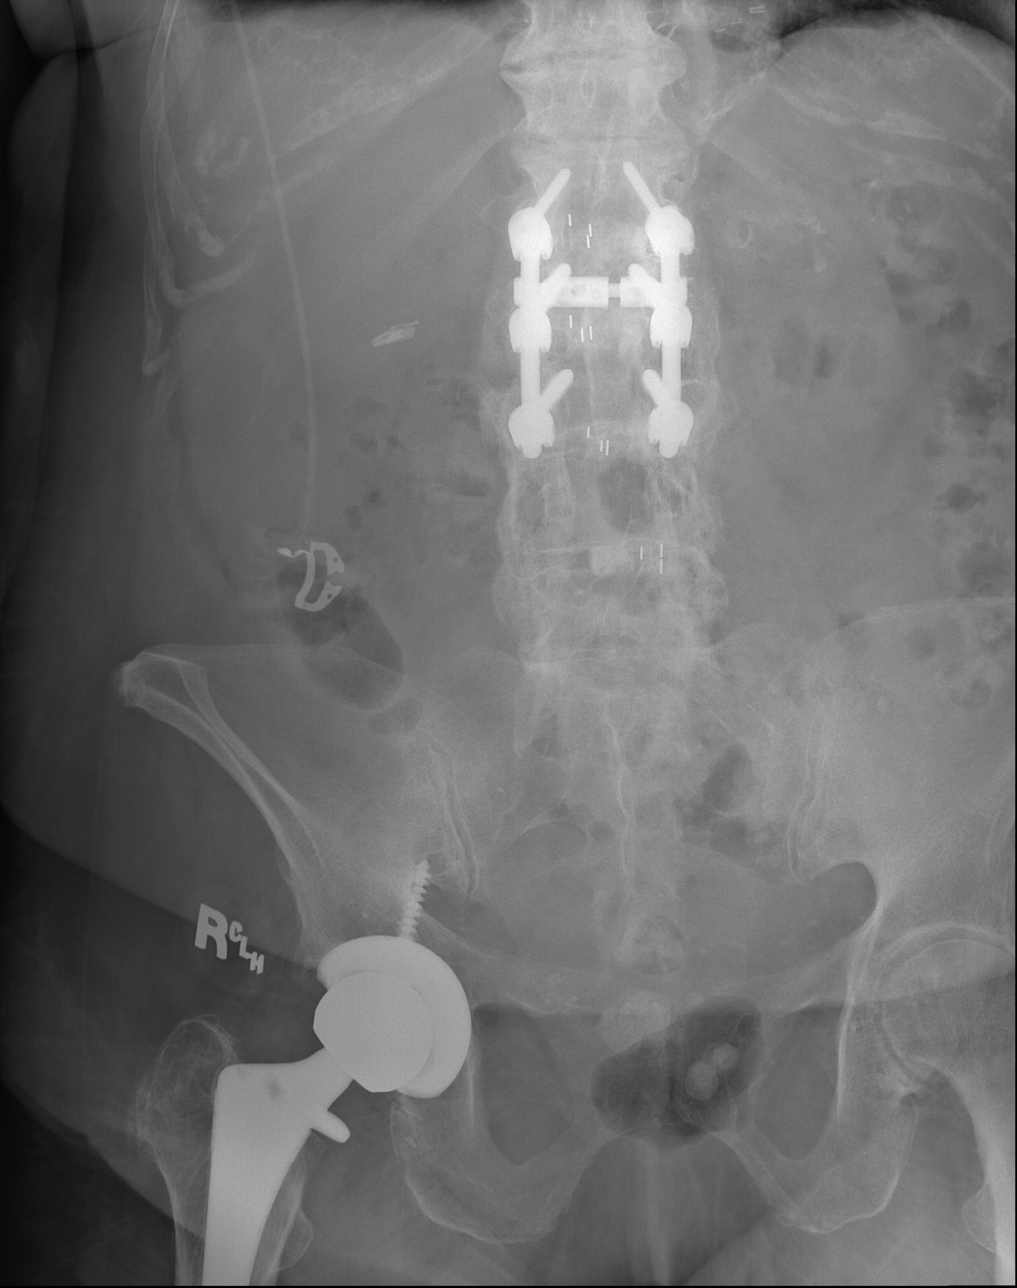

[x abdomen supine (2 of 2)]
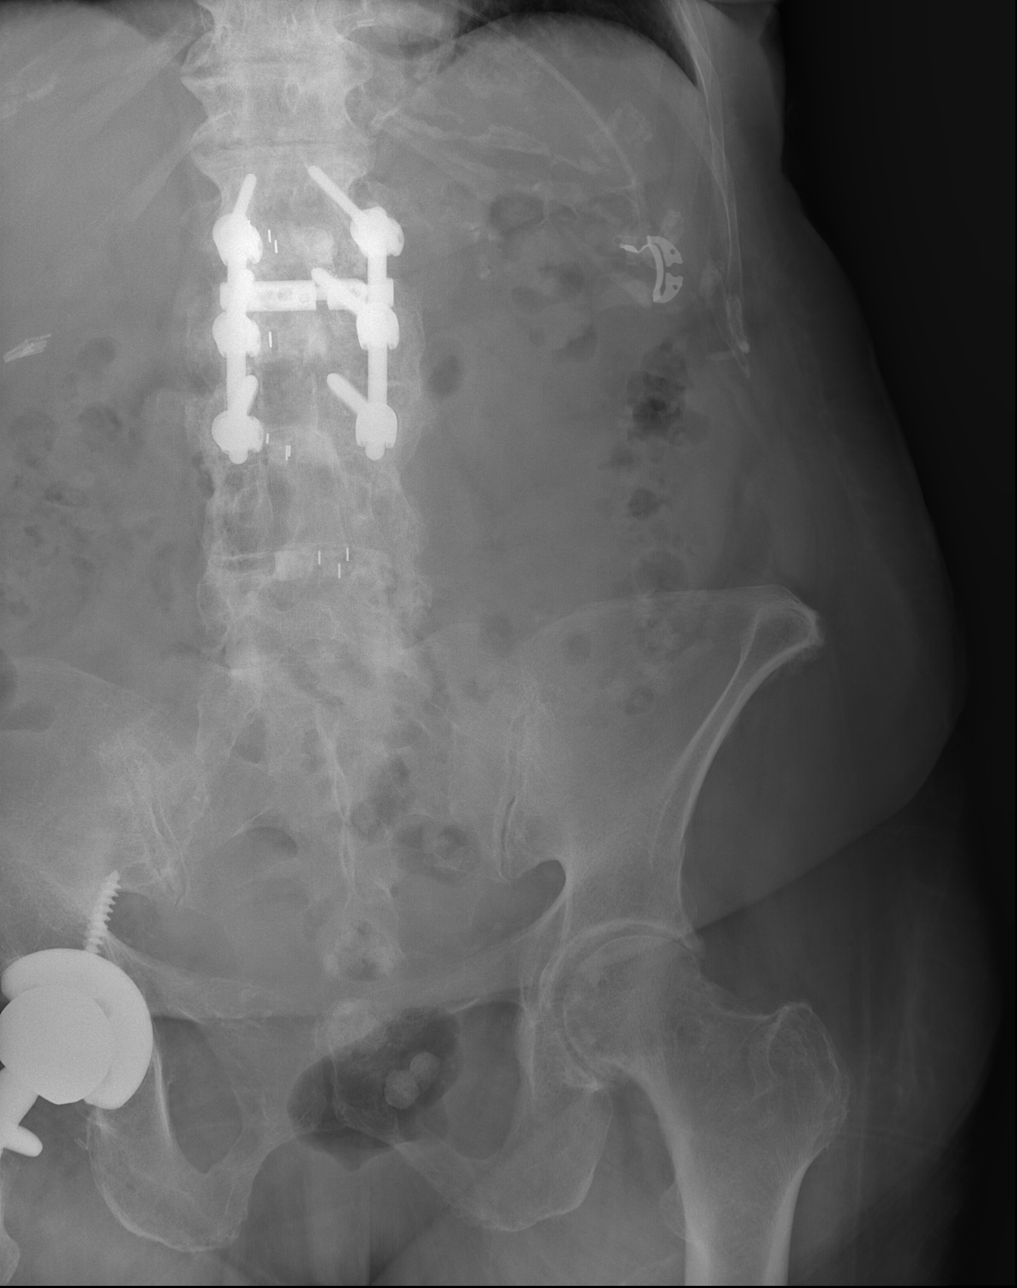

[w abdomen upright]
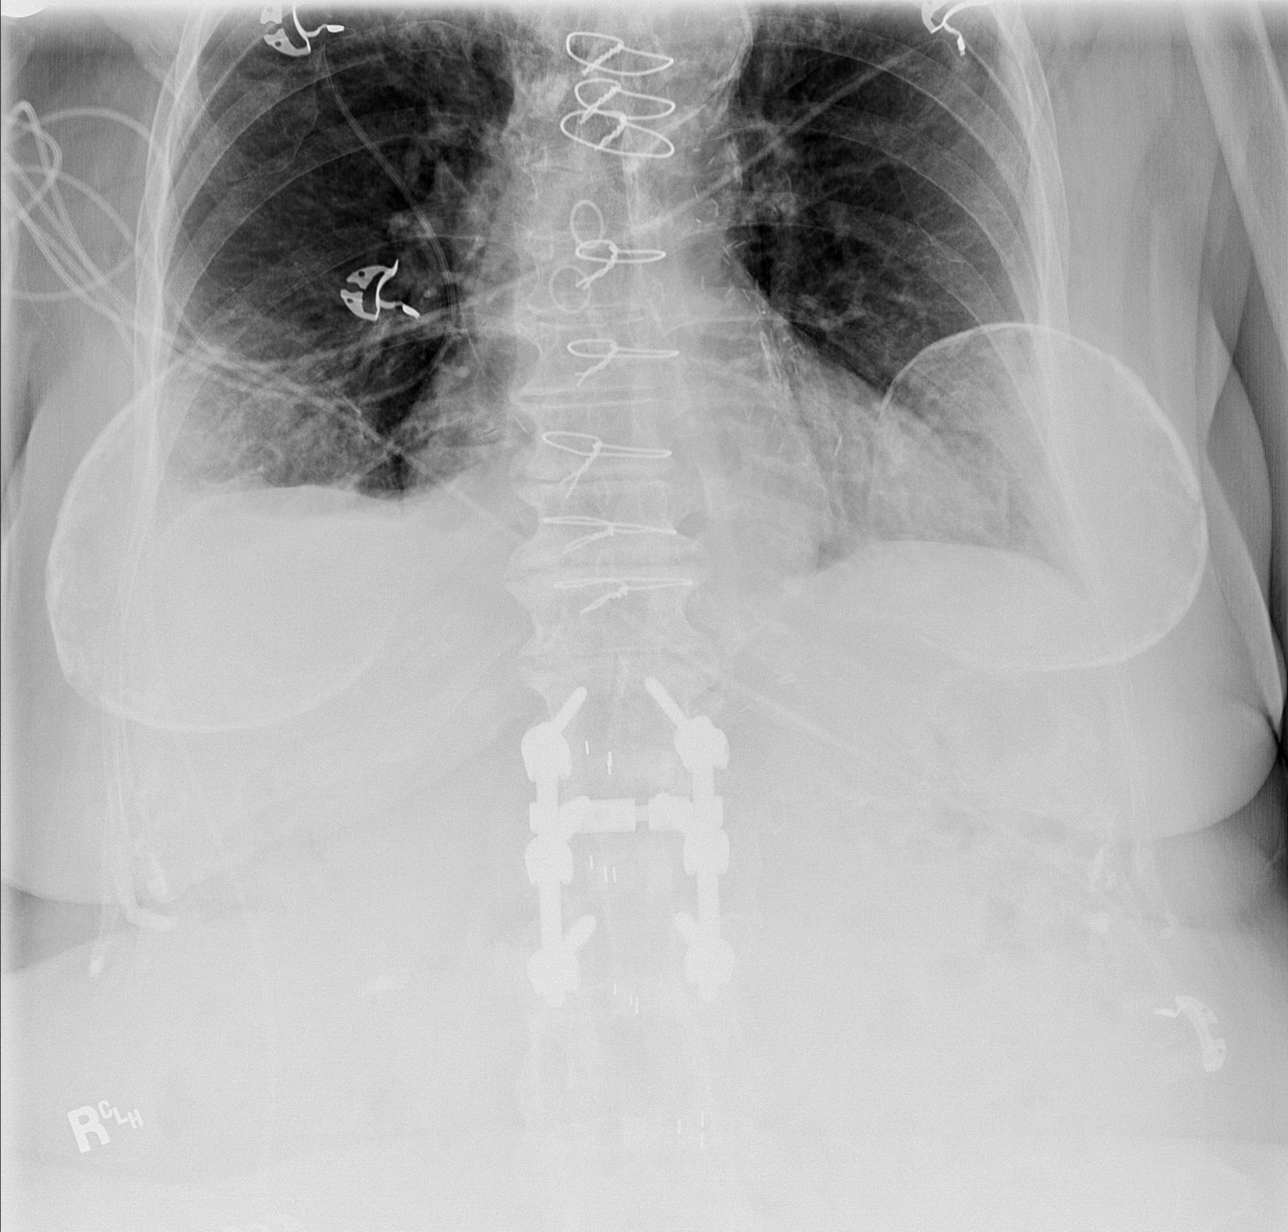

[3 of 3 positions shown; findings below may reference images not displayed]

FINDINGS: Normal bowel gas pattern.

No bowel dilatation or bowel wall thickening.

Gas present in rectum.

Bones demineralized with evidence of prior lumbar fusion and prior
RIGHT hip replacement.

Scattered degenerative changes of lower thoracic spine.

Surgical clips RIGHT upper quadrant question cholecystectomy.

Degenerative changes LEFT hip joint.

Question vascular calcification versus 8 mm diameter calculus at
upper pole LEFT kidney.
IMPRESSION: No acute abnormalities.

## 2019-08-26 DIAGNOSIS — J449 Chronic obstructive pulmonary disease, unspecified: Secondary | ICD-10-CM | POA: Diagnosis not present

## 2019-08-31 DIAGNOSIS — H6090 Unspecified otitis externa, unspecified ear: Secondary | ICD-10-CM | POA: Diagnosis not present

## 2019-09-16 DIAGNOSIS — G894 Chronic pain syndrome: Secondary | ICD-10-CM | POA: Diagnosis not present

## 2019-09-16 DIAGNOSIS — Z23 Encounter for immunization: Secondary | ICD-10-CM | POA: Diagnosis not present

## 2019-09-25 DIAGNOSIS — J449 Chronic obstructive pulmonary disease, unspecified: Secondary | ICD-10-CM | POA: Diagnosis not present

## 2019-09-26 ENCOUNTER — Other Ambulatory Visit: Payer: Self-pay | Admitting: Internal Medicine

## 2019-10-04 ENCOUNTER — Inpatient Hospital Stay (HOSPITAL_COMMUNITY)
Admission: AD | Admit: 2019-10-04 | Discharge: 2019-10-08 | DRG: 292 | Disposition: A | Discharge status: Home / Self Care | Payer: Medicare HMO | Source: Ambulatory Visit | Attending: Cardiology | Admitting: Cardiology

## 2019-10-04 ENCOUNTER — Encounter: Payer: Self-pay | Admitting: Physician Assistant

## 2019-10-04 ENCOUNTER — Other Ambulatory Visit: Payer: Self-pay

## 2019-10-04 ENCOUNTER — Ambulatory Visit (INDEPENDENT_AMBULATORY_CARE_PROVIDER_SITE_OTHER): Payer: Medicare HMO | Admitting: Physician Assistant

## 2019-10-04 VITALS — BP 120/56 | HR 54 | Ht 68.0 in | Wt 177.2 lb

## 2019-10-04 DIAGNOSIS — K551 Chronic vascular disorders of intestine: Secondary | ICD-10-CM

## 2019-10-04 DIAGNOSIS — Z79890 Hormone replacement therapy (postmenopausal): Secondary | ICD-10-CM

## 2019-10-04 DIAGNOSIS — Z87891 Personal history of nicotine dependence: Secondary | ICD-10-CM

## 2019-10-04 DIAGNOSIS — Z8249 Family history of ischemic heart disease and other diseases of the circulatory system: Secondary | ICD-10-CM

## 2019-10-04 DIAGNOSIS — E785 Hyperlipidemia, unspecified: Secondary | ICD-10-CM | POA: Diagnosis present

## 2019-10-04 DIAGNOSIS — Z951 Presence of aortocoronary bypass graft: Secondary | ICD-10-CM

## 2019-10-04 DIAGNOSIS — Z96611 Presence of right artificial shoulder joint: Secondary | ICD-10-CM | POA: Diagnosis present

## 2019-10-04 DIAGNOSIS — Z825 Family history of asthma and other chronic lower respiratory diseases: Secondary | ICD-10-CM

## 2019-10-04 DIAGNOSIS — K219 Gastro-esophageal reflux disease without esophagitis: Secondary | ICD-10-CM | POA: Diagnosis present

## 2019-10-04 DIAGNOSIS — M109 Gout, unspecified: Secondary | ICD-10-CM | POA: Diagnosis present

## 2019-10-04 DIAGNOSIS — Z880 Allergy status to penicillin: Secondary | ICD-10-CM

## 2019-10-04 DIAGNOSIS — Z955 Presence of coronary angioplasty implant and graft: Secondary | ICD-10-CM

## 2019-10-04 DIAGNOSIS — J449 Chronic obstructive pulmonary disease, unspecified: Secondary | ICD-10-CM | POA: Diagnosis present

## 2019-10-04 DIAGNOSIS — J9611 Chronic respiratory failure with hypoxia: Secondary | ICD-10-CM | POA: Diagnosis not present

## 2019-10-04 DIAGNOSIS — I5033 Acute on chronic diastolic (congestive) heart failure: Secondary | ICD-10-CM | POA: Diagnosis present

## 2019-10-04 DIAGNOSIS — Z981 Arthrodesis status: Secondary | ICD-10-CM

## 2019-10-04 DIAGNOSIS — Z888 Allergy status to other drugs, medicaments and biological substances status: Secondary | ICD-10-CM

## 2019-10-04 DIAGNOSIS — Z86718 Personal history of other venous thrombosis and embolism: Secondary | ICD-10-CM | POA: Diagnosis not present

## 2019-10-04 DIAGNOSIS — Z96649 Presence of unspecified artificial hip joint: Secondary | ICD-10-CM | POA: Diagnosis present

## 2019-10-04 DIAGNOSIS — K7460 Unspecified cirrhosis of liver: Secondary | ICD-10-CM | POA: Diagnosis not present

## 2019-10-04 DIAGNOSIS — E669 Obesity, unspecified: Secondary | ICD-10-CM | POA: Diagnosis present

## 2019-10-04 DIAGNOSIS — Z9981 Dependence on supplemental oxygen: Secondary | ICD-10-CM | POA: Diagnosis not present

## 2019-10-04 DIAGNOSIS — J439 Emphysema, unspecified: Secondary | ICD-10-CM | POA: Diagnosis present

## 2019-10-04 DIAGNOSIS — Z96622 Presence of left artificial elbow joint: Secondary | ICD-10-CM | POA: Diagnosis present

## 2019-10-04 DIAGNOSIS — I110 Hypertensive heart disease with heart failure: Principal | ICD-10-CM | POA: Diagnosis present

## 2019-10-04 DIAGNOSIS — I10 Essential (primary) hypertension: Secondary | ICD-10-CM

## 2019-10-04 DIAGNOSIS — I509 Heart failure, unspecified: Secondary | ICD-10-CM

## 2019-10-04 DIAGNOSIS — G4730 Sleep apnea, unspecified: Secondary | ICD-10-CM | POA: Diagnosis present

## 2019-10-04 DIAGNOSIS — Z801 Family history of malignant neoplasm of trachea, bronchus and lung: Secondary | ICD-10-CM

## 2019-10-04 DIAGNOSIS — I25110 Atherosclerotic heart disease of native coronary artery with unstable angina pectoris: Secondary | ICD-10-CM | POA: Diagnosis not present

## 2019-10-04 DIAGNOSIS — Z7951 Long term (current) use of inhaled steroids: Secondary | ICD-10-CM | POA: Diagnosis not present

## 2019-10-04 DIAGNOSIS — Z96653 Presence of artificial knee joint, bilateral: Secondary | ICD-10-CM | POA: Diagnosis present

## 2019-10-04 DIAGNOSIS — Z6833 Body mass index (BMI) 33.0-33.9, adult: Secondary | ICD-10-CM | POA: Diagnosis not present

## 2019-10-04 DIAGNOSIS — Z20828 Contact with and (suspected) exposure to other viral communicable diseases: Secondary | ICD-10-CM | POA: Diagnosis not present

## 2019-10-04 DIAGNOSIS — Z7982 Long term (current) use of aspirin: Secondary | ICD-10-CM | POA: Diagnosis not present

## 2019-10-04 DIAGNOSIS — Z8041 Family history of malignant neoplasm of ovary: Secondary | ICD-10-CM

## 2019-10-04 DIAGNOSIS — I25119 Atherosclerotic heart disease of native coronary artery with unspecified angina pectoris: Secondary | ICD-10-CM | POA: Diagnosis present

## 2019-10-04 DIAGNOSIS — E039 Hypothyroidism, unspecified: Secondary | ICD-10-CM | POA: Diagnosis present

## 2019-10-04 DIAGNOSIS — Z96612 Presence of left artificial shoulder joint: Secondary | ICD-10-CM | POA: Diagnosis present

## 2019-10-04 DIAGNOSIS — Z96621 Presence of right artificial elbow joint: Secondary | ICD-10-CM | POA: Diagnosis present

## 2019-10-04 DIAGNOSIS — Z95828 Presence of other vascular implants and grafts: Secondary | ICD-10-CM | POA: Diagnosis not present

## 2019-10-04 DIAGNOSIS — R609 Edema, unspecified: Secondary | ICD-10-CM | POA: Diagnosis not present

## 2019-10-04 DIAGNOSIS — Z86711 Personal history of pulmonary embolism: Secondary | ICD-10-CM

## 2019-10-04 DIAGNOSIS — R0602 Shortness of breath: Secondary | ICD-10-CM | POA: Diagnosis present

## 2019-10-04 DIAGNOSIS — I739 Peripheral vascular disease, unspecified: Secondary | ICD-10-CM | POA: Diagnosis present

## 2019-10-04 LAB — COMPREHENSIVE METABOLIC PANEL
ALT: 10 U/L (ref 0–44)
AST: 15 U/L (ref 15–41)
Albumin: 3 g/dL — ABNORMAL LOW (ref 3.5–5.0)
Alkaline Phosphatase: 75 U/L (ref 38–126)
Anion gap: 11 (ref 5–15)
BUN: 10 mg/dL (ref 8–23)
CO2: 31 mmol/L (ref 22–32)
Calcium: 8.9 mg/dL (ref 8.9–10.3)
Chloride: 98 mmol/L (ref 98–111)
Creatinine, Ser: 1.12 mg/dL — ABNORMAL HIGH (ref 0.44–1.00)
GFR calc Af Amer: 54 mL/min — ABNORMAL LOW (ref >60)
GFR calc non Af Amer: 47 mL/min — ABNORMAL LOW (ref >60)
Glucose, Bld: 113 mg/dL — ABNORMAL HIGH (ref 70–99)
Potassium: 3.1 mmol/L — ABNORMAL LOW (ref 3.5–5.1)
Sodium: 140 mmol/L (ref 135–145)
Total Bilirubin: 0.5 mg/dL (ref 0.3–1.2)
Total Protein: 6.2 g/dL — ABNORMAL LOW (ref 6.5–8.1)

## 2019-10-04 LAB — CBC WITH DIFFERENTIAL/PLATELET
Abs Immature Granulocytes: 0.08 10*3/uL — ABNORMAL HIGH (ref 0.00–0.07)
Basophils Absolute: 0.1 10*3/uL (ref 0.0–0.1)
Basophils Relative: 1 %
Eosinophils Absolute: 0.3 10*3/uL (ref 0.0–0.5)
Eosinophils Relative: 3 %
HCT: 35.5 % — ABNORMAL LOW (ref 36.0–46.0)
Hemoglobin: 11.6 g/dL — ABNORMAL LOW (ref 12.0–15.0)
Immature Granulocytes: 1 %
Lymphocytes Relative: 22 %
Lymphs Abs: 2.3 10*3/uL (ref 0.7–4.0)
MCH: 29.9 pg (ref 26.0–34.0)
MCHC: 32.7 g/dL (ref 30.0–36.0)
MCV: 91.5 fL (ref 80.0–100.0)
Monocytes Absolute: 1 10*3/uL (ref 0.1–1.0)
Monocytes Relative: 10 %
Neutro Abs: 6.3 10*3/uL (ref 1.7–7.7)
Neutrophils Relative %: 63 %
Platelets: 263 10*3/uL (ref 150–400)
RBC: 3.88 MIL/uL (ref 3.87–5.11)
RDW: 13.4 % (ref 11.5–15.5)
WBC: 10.1 10*3/uL (ref 4.0–10.5)
nRBC: 0 % (ref 0.0–0.2)

## 2019-10-04 LAB — SARS CORONAVIRUS 2 BY RT PCR (HOSPITAL ORDER, PERFORMED IN ~~LOC~~ HOSPITAL LAB): SARS Coronavirus 2: NEGATIVE

## 2019-10-04 LAB — BRAIN NATRIURETIC PEPTIDE: B Natriuretic Peptide: 232.5 pg/mL — ABNORMAL HIGH (ref 0.0–100.0)

## 2019-10-04 LAB — TROPONIN I (HIGH SENSITIVITY)
Troponin I (High Sensitivity): 10 ng/L (ref <18)
Troponin I (High Sensitivity): 11 ng/L (ref <18)

## 2019-10-04 MED ORDER — VITAMIN D3 25 MCG (1000 UNIT) PO TABS: 1000.0000 [IU] | ORAL_TABLET | Freq: Every day | ORAL | Status: DC

## 2019-10-04 MED ORDER — POTASSIUM CHLORIDE CRYS ER 20 MEQ PO TBCR
20.0000 meq | EXTENDED_RELEASE_TABLET | Freq: Two times a day (BID) | ORAL | Status: DC
  Administered 2019-10-05: 20 meq via ORAL
  Filled 2019-10-04: qty 1

## 2019-10-04 MED ORDER — PRAMIPEXOLE DIHYDROCHLORIDE 0.25 MG PO TABS
0.2500 mg | ORAL_TABLET | Freq: Every day | ORAL | Status: DC
  Administered 2019-10-04 – 2019-10-07 (×4): 0.25 mg via ORAL
  Filled 2019-10-04 (×4): qty 1

## 2019-10-04 MED ORDER — NITROGLYCERIN 0.4 MG SL SUBL: 0.4000 mg | SUBLINGUAL_TABLET | SUBLINGUAL | Status: DC | PRN

## 2019-10-04 MED ORDER — CYCLOBENZAPRINE HCL 10 MG PO TABS
10.0000 mg | ORAL_TABLET | Freq: Three times a day (TID) | ORAL | Status: DC | PRN
  Administered 2019-10-05 – 2019-10-07 (×2): 10 mg via ORAL
  Filled 2019-10-04 (×2): qty 1

## 2019-10-04 MED ORDER — TRAZODONE HCL 50 MG PO TABS
50.0000 mg | ORAL_TABLET | Freq: Every day | ORAL | Status: DC
  Administered 2019-10-05 – 2019-10-07 (×3): 50 mg via ORAL
  Filled 2019-10-04 (×3): qty 1

## 2019-10-04 MED ORDER — MAGNESIUM OXIDE 400 (241.3 MG) MG PO TABS
400.0000 mg | ORAL_TABLET | Freq: Two times a day (BID) | ORAL | Status: DC
  Administered 2019-10-05 – 2019-10-08 (×7): 400 mg via ORAL
  Filled 2019-10-04 (×7): qty 1

## 2019-10-04 MED ORDER — FERROUS SULFATE 325 (65 FE) MG PO TABS: 325.0000 mg | ORAL_TABLET | Freq: Every day | ORAL | Status: DC

## 2019-10-04 MED ORDER — CITALOPRAM HYDROBROMIDE 20 MG PO TABS
40.0000 mg | ORAL_TABLET | Freq: Every day | ORAL | Status: DC
  Administered 2019-10-04 – 2019-10-07 (×4): 40 mg via ORAL
  Filled 2019-10-04 (×4): qty 2

## 2019-10-04 MED ORDER — HYDROCODONE-ACETAMINOPHEN 5-325 MG PO TABS
1.0000 | ORAL_TABLET | ORAL | Status: DC | PRN
  Administered 2019-10-04: 1 via ORAL
  Filled 2019-10-04: qty 1

## 2019-10-04 MED ORDER — SPIRONOLACTONE 25 MG PO TABS: 25.0000 mg | ORAL_TABLET | Freq: Every day | ORAL | Status: DC

## 2019-10-04 MED ORDER — LEVOTHYROXINE SODIUM 75 MCG PO TABS
37.5000 ug | ORAL_TABLET | Freq: Every day | ORAL | Status: DC
  Administered 2019-10-05 – 2019-10-06 (×2): 37.5 ug via ORAL
  Filled 2019-10-04 (×2): qty 1

## 2019-10-04 MED ORDER — PANTOPRAZOLE SODIUM 40 MG PO TBEC
40.0000 mg | DELAYED_RELEASE_TABLET | Freq: Every day | ORAL | Status: DC
  Administered 2019-10-05 – 2019-10-08 (×4): 40 mg via ORAL
  Filled 2019-10-04 (×4): qty 1

## 2019-10-04 MED ORDER — NEOMYCIN-POLYMYXIN-HC 3.5-10000-1 OT SUSP
5.0000 [drp] | Freq: Two times a day (BID) | OTIC | Status: DC
  Administered 2019-10-04 – 2019-10-08 (×8): 5 [drp] via OTIC
  Filled 2019-10-04 (×4): qty 10

## 2019-10-04 MED ORDER — IPRATROPIUM-ALBUTEROL 0.5-2.5 (3) MG/3ML IN SOLN: 3.0000 mL | RESPIRATORY_TRACT | Status: DC | PRN

## 2019-10-04 MED ORDER — CITALOPRAM HYDROBROMIDE 20 MG PO TABS: 40.0000 mg | ORAL_TABLET | Freq: Every day | ORAL | Status: DC

## 2019-10-04 MED ORDER — UMECLIDINIUM BROMIDE 62.5 MCG/INH IN AEPB
1.0000 | INHALATION_SPRAY | Freq: Two times a day (BID) | RESPIRATORY_TRACT | Status: DC
  Administered 2019-10-05 – 2019-10-08 (×7): 1 via RESPIRATORY_TRACT
  Filled 2019-10-04: qty 7

## 2019-10-04 MED ORDER — ASPIRIN EC 81 MG PO TBEC: 81.0000 mg | DELAYED_RELEASE_TABLET | Freq: Every day | ORAL | Status: DC

## 2019-10-04 MED ORDER — LEVOTHYROXINE SODIUM 75 MCG PO TABS: 37.5000 ug | ORAL_TABLET | Freq: Every day | ORAL | Status: DC

## 2019-10-04 MED ORDER — ASPIRIN EC 81 MG PO TBEC
81.0000 mg | DELAYED_RELEASE_TABLET | Freq: Every day | ORAL | Status: DC
  Administered 2019-10-05 – 2019-10-08 (×4): 81 mg via ORAL
  Filled 2019-10-04 (×4): qty 1

## 2019-10-04 MED ORDER — ENOXAPARIN SODIUM 40 MG/0.4ML ~~LOC~~ SOLN
40.0000 mg | Freq: Every day | SUBCUTANEOUS | Status: DC
  Administered 2019-10-05 – 2019-10-08 (×4): 40 mg via SUBCUTANEOUS
  Filled 2019-10-04 (×4): qty 0.4

## 2019-10-04 MED ORDER — SODIUM CHLORIDE 0.9% FLUSH
3.0000 mL | Freq: Two times a day (BID) | INTRAVENOUS | Status: DC
  Administered 2019-10-04 – 2019-10-08 (×8): 3 mL via INTRAVENOUS

## 2019-10-04 MED ORDER — TRAZODONE HCL 50 MG PO TABS
50.0000 mg | ORAL_TABLET | Freq: Every day | ORAL | Status: DC
  Administered 2019-10-04: 50 mg via ORAL
  Filled 2019-10-04: qty 1

## 2019-10-04 MED ORDER — HYDROCODONE-ACETAMINOPHEN 5-325 MG PO TABS
1.0000 | ORAL_TABLET | ORAL | Status: DC | PRN
  Administered 2019-10-05 – 2019-10-08 (×7): 1 via ORAL
  Filled 2019-10-04 (×7): qty 1

## 2019-10-04 MED ORDER — ATORVASTATIN CALCIUM 10 MG PO TABS
20.0000 mg | ORAL_TABLET | Freq: Every day | ORAL | Status: DC
  Administered 2019-10-05 – 2019-10-07 (×3): 20 mg via ORAL
  Filled 2019-10-04 (×4): qty 2

## 2019-10-04 MED ORDER — ACETAMINOPHEN 325 MG PO TABS: 650.0000 mg | ORAL_TABLET | ORAL | Status: DC | PRN

## 2019-10-04 MED ORDER — ALLOPURINOL 100 MG PO TABS
100.0000 mg | ORAL_TABLET | Freq: Every day | ORAL | Status: DC
  Administered 2019-10-05 – 2019-10-08 (×4): 100 mg via ORAL
  Filled 2019-10-04 (×4): qty 1

## 2019-10-04 MED ORDER — VITAMIN D 25 MCG (1000 UNIT) PO TABS
1000.0000 [IU] | ORAL_TABLET | Freq: Every day | ORAL | Status: DC
  Administered 2019-10-05 – 2019-10-08 (×4): 1000 [IU] via ORAL
  Filled 2019-10-04 (×4): qty 1

## 2019-10-04 MED ORDER — PILOCARPINE HCL 1 % OP SOLN
1.0000 [drp] | Freq: Two times a day (BID) | OPHTHALMIC | Status: DC
  Administered 2019-10-04 – 2019-10-08 (×8): 1 [drp] via OPHTHALMIC
  Filled 2019-10-04 (×2): qty 15

## 2019-10-04 MED ORDER — ENOXAPARIN SODIUM 40 MG/0.4ML ~~LOC~~ SOLN: 40.0000 mg | SUBCUTANEOUS | Status: DC

## 2019-10-04 MED ORDER — SODIUM CHLORIDE 0.9 % IV SOLN: 250.0000 mL | INTRAVENOUS | Status: DC | PRN

## 2019-10-04 MED ORDER — FERROUS SULFATE 325 (65 FE) MG PO TABS
325.0000 mg | ORAL_TABLET | Freq: Every day | ORAL | Status: DC
  Administered 2019-10-05 – 2019-10-08 (×4): 325 mg via ORAL
  Filled 2019-10-04 (×4): qty 1

## 2019-10-04 MED ORDER — ATORVASTATIN CALCIUM 10 MG PO TABS: 20.0000 mg | ORAL_TABLET | Freq: Every day | ORAL | Status: DC

## 2019-10-04 MED ORDER — SODIUM CHLORIDE 0.9% FLUSH: 3.0000 mL | INTRAVENOUS | Status: DC | PRN

## 2019-10-04 MED ORDER — SPIRONOLACTONE 25 MG PO TABS
25.0000 mg | ORAL_TABLET | Freq: Every day | ORAL | Status: DC
  Administered 2019-10-05 – 2019-10-08 (×4): 25 mg via ORAL
  Filled 2019-10-04 (×4): qty 1

## 2019-10-04 MED ORDER — FUROSEMIDE 10 MG/ML IJ SOLN: 60.0000 mg | Freq: Two times a day (BID) | INTRAMUSCULAR | Status: DC

## 2019-10-04 MED ORDER — CYCLOBENZAPRINE HCL 10 MG PO TABS: 10.0000 mg | ORAL_TABLET | Freq: Three times a day (TID) | ORAL | Status: DC | PRN

## 2019-10-04 MED ORDER — PANTOPRAZOLE SODIUM 40 MG PO TBEC: 40.0000 mg | DELAYED_RELEASE_TABLET | Freq: Every day | ORAL | Status: DC

## 2019-10-04 MED ORDER — FUROSEMIDE 10 MG/ML IJ SOLN
60.0000 mg | Freq: Two times a day (BID) | INTRAMUSCULAR | Status: DC
  Administered 2019-10-04 – 2019-10-06 (×5): 60 mg via INTRAVENOUS
  Filled 2019-10-04 (×5): qty 6

## 2019-10-04 MED ORDER — ALLOPURINOL 100 MG PO TABS: 100.0000 mg | ORAL_TABLET | Freq: Every day | ORAL | Status: DC

## 2019-10-04 MED ORDER — ROFLUMILAST 500 MCG PO TABS
500.0000 ug | ORAL_TABLET | Freq: Every evening | ORAL | Status: DC
  Administered 2019-10-04 – 2019-10-07 (×4): 500 ug via ORAL
  Filled 2019-10-04 (×6): qty 1

## 2019-10-04 MED ORDER — POTASSIUM CHLORIDE CRYS ER 20 MEQ PO TBCR
20.0000 meq | EXTENDED_RELEASE_TABLET | Freq: Two times a day (BID) | ORAL | Status: DC
  Administered 2019-10-04: 20 meq via ORAL
  Filled 2019-10-04: qty 1

## 2019-10-04 MED ORDER — MAGNESIUM OXIDE 400 (241.3 MG) MG PO TABS
400.0000 mg | ORAL_TABLET | Freq: Two times a day (BID) | ORAL | Status: DC
  Administered 2019-10-04: 400 mg via ORAL
  Filled 2019-10-04: qty 1

## 2019-10-04 MED ORDER — MELATONIN 3 MG PO TABS
3.0000 mg | ORAL_TABLET | Freq: Every day | ORAL | Status: DC
  Administered 2019-10-04 – 2019-10-07 (×4): 3 mg via ORAL
  Filled 2019-10-04 (×5): qty 1

## 2019-10-04 MED ORDER — IPRATROPIUM-ALBUTEROL 20-100 MCG/ACT IN AERS: 1.0000 | INHALATION_SPRAY | RESPIRATORY_TRACT | Status: DC | PRN

## 2019-10-04 NOTE — Patient Instructions (Signed)
Medication Instructions:  Your physician recommends that you continue on your current medications as directed. Please refer to the Current Medication list given to you today.  If you need a refill on your cardiac medications before your next appointment, please call your pharmacy.   Lab work: None   If you have labs (blood work) drawn today and your tests are completely normal, you will receive your results only by: Marland Kitchen MyChart Message (if you have MyChart) OR . A paper copy in the mail If you have any lab test that is abnormal or we need to change your treatment, we will call you to review the results.  Testing/Procedures: None   Follow up:  PLEASE PROCEED TO [facility]

## 2019-10-04 NOTE — Progress Notes (Signed)
Cardiology Office Note:    Date:  10/04/2019   ID:  SPARROW [name], DOB 06-Feb-1941, MRN [id number]  PCP:  [name], MD  Cardiologist:  Dorris Carnes, MD  Electrophysiologist:  None   Referring MD: [name], [name], MD   Chief Complaint  Patient presents with   Shortness of Breath    History of Present Illness:    [name] is a 78 y.o. female with:   Coronary artery disease   S/p CABG 2004  S/p DES to LM in 2010  Myoview 9/18: dist ant and apical ischemia; low risk  Chronic diastolic CHF  Last admit in 12/2018  Carotid artery disease  Recurrent DVT  anticoagulation DC'd in past due to GI bleed  admx in 3/19 with multiple bilat PEs with L femoral DVT  anticoagulation c/b large abd wall hemotoma >> s/p tx with PRBCs  S/p IVC filter  PAD  S/p SMA stenting in 2019 by [facility]  Hypertension   COPD  Cirrhosis  Ms. [name] was last seen by Dr. Harrington Challenger via Telemedicine in 03/2019. She presents today for further evaluation of shortness of breath.  She is here today with her daughter.  She has chronic dyspnea but has felt more short of breath over the past month.  She notes dyspnea with just minimal activity.  She also notes orthopnea as well as paroxysmal nocturnal dyspnea.  She has also had some episodes of chest discomfort.  Her last episode was a couple of weeks ago.  She took nitroglycerin with relief.  She has not had syncope.  Her oxygen levels have been running low and she has been using supplemental oxygen at home more often.  She has not had a cough.  She has not had fever.  She feels that her breathing has been getting worse and was actually prepared to be admitted to the hospital today.   Prior CV studies:   The following studies were reviewed today:  Carotid US 06/06/2019 Summary: Right Carotid: Velocities in the right ICA are consistent with a 1-39% stenosis.                Non-hemodynamically significant plaque <50% noted in the CCA. The  RICA velocities remain within normal range and stable compared to                the previous exam.  Left Carotid: Velocities in the left ICA are consistent with a 40-59% stenosis,               based on peak systolic velocities and plaque formation.               Non-hemodynamically significant plaque noted in the CCA. The ECA               appears >50% stenosed. The LICA velocities are elevated and stable               compared to the previous exam.  Vertebrals:  Left vertebral artery demonstrates antegrade flow. Small caliber              right vertebral artery with atypical, antegrade flow. Subclavians: Bilateral subclavian arteries were stenotic. Right subclavian              artery flow was disturbed.  Echocardiogram 11/09/2018 Mild concentric LVH, EF 60-65, normal wall motion, grade 1 diastolic dysfunction, severe MAC, trivial MR, mild BAE, trivial PI  Mesenteric vascular US 06/30/2018 Mesenteric: Normal Celiac artery ,  Splenic artery, Hepatic artery and Inferior Mesenteric artery findings. 70 to 99% stenosis in the superior mesenteric artery.  Mesenteric artery ultrasound 04/22/2018 FINAL INTERPRETATION: Mesenteric: 70 to 99% stenosis in the superior mesenteric artery, based on limited Visualization.  Echo 04/03/2018 EF 55-60, normal wall motion, MAC  Abdominal and chest CTA 04/01/2018 IMPRESSION: 1. No evidence of pulmonary embolism. Interval resolution of previously seen left lower lobe pulmonary emboli. 2. Unchanged moderate right and trace left pleural effusions with adjacent bibasilar atelectasis. 3. Relatively unchanged large anterior abdominal wall hematoma involving the space of Retzius, measuring 11.0 x 16.7 x 12.6 cm. 4. Aortic atherosclerosis (ICD10-I70.0).  Echo 03/02/2018 EF 55-60, normal wall motion, trivial AI, MAC, RVSP 30  Echo 09/21/2017 Mild LVH, EF 19-62, grade 1 diastolic dysfunction, mild AI, moderate to severe MAC, mild MR, mild  BAE  Nuclear stress test 08/31/2017  Nuclear stress EF: 65%.  Small region of distal anterior and apical ischemia, cannot exclude shifting breast attenuation. . Small anteroseptal defect consistent with probable soft tissue attenuation  This is a low risk study.  Carotid US 12/19/2015 Bilateral ICA 60-79, elevated R subclavian velocities Follow-up 1 year  Cardiac catheterization 12/18/2009 ([place]) LM ostial 90, D1 100 LAD proximal 100 OM 9 RCA diffuse disease RI proximal 100 LIMA-LAD patent SVG-OM1 patent SVG-RI/D1 100 PCI: 4 x 12 mm Xience DES to the LM   Past Medical History:  Diagnosis Date   Anemia    bld. transfusion post lumbar surgery- 2012   Anxiety    Arthralgia    NOS   Blood transfusion 2012; 02/2018   WITH BACK SURGERY; "related to hematoma in stomach" (10/06/2018)   CAD (coronary artery disease)    s/p CABG 2004; s/p DES to LM in 2010;  [place] 10/29/11: EF 50-55%, mild elevated filling pressures, no pulmonary HTN, LM 90% ISR, LAD and CFX occluded, S-RI occluded (old), S-OM3 ok and L-LAD ok, native nondominant RCA 95% -  med rx recommended ; Lexiscan Myoview 7/13 at [facility]: demonstrated "normal LV function, anterior attenuation and localized ischemia, inferior, basilar, mid section"   Carotid artery disease ([place])    Carotid US 2/29:  RICA 7-98; LICA 92-11; R subclavian stenosis - Repeat 1 year. // Carotid US 05/2019: R 1-39; L 40-59; R vertebral with atypical antegrade flow; R subclavian stenosis >> repeat 1 year   CHF (congestive heart failure) (HCC)    Chronic diastolic heart failure ([place])    Echo 9/10: EF 94-17%, grade 1 diastolic dysfunction   Chronic lower back pain    COPD (chronic obstructive pulmonary disease) ([place])    Emphysema dxed by Dr. Woody Seller in [place] based on PFTs per pt in 2006; placed on albuterol   Depression    TAKES CELEXA  AND  (OFF- WELBUTRIN)   Diuretic-induced hypokalemia 10/08/2018   DVT of lower extremity (deep  venous thrombosis) (HCC)    recurrent. bilateral (2 episodes)   Dyspnea    Dysrhythmia    afib with cabg   Exertional angina (HCC)    Treated with Isosorbide, Ranexa, amlodipine; intolerant to metoprolol   GERD (gastroesophageal reflux disease)    Gout    "on daily RX" (10/06/2018)   HLD (hyperlipidemia)    Hypertension    Hyperthyroidism    Obesity (BMI 30-39.9) 2009   BMI 33   Osteoarthritis    "all over; hands" (10/06/2018)   Oxygen deficiency    Oxygen dependent    4L at home as needed (10/06/2018)   Pneumonia  PVD (peripheral vascular disease) ([place])    Sleep apnea 2012   USED CPAP THEN  STARTED USING SPIRIVA , Nov. 2013- last evaluation , changed from mask to aparatus that is just for her nose.. ; reports 05-28-18 "the mask smothers me so i dont use it right now" (10/06/2018)   Surgical Hx: The patient  has a past surgical history that includes Ankle surgery (2004); Groin mass open biopsy (2004); Total hip arthroplasty (2007); Reverse shoulder arthroplasty (02/26/2012); Shoulder arthroscopy with subacromial decompression (Left, 02/10/2013); Esophagogastroduodenoscopy ([name], 02/14/2017); Givens capsule study ([name], 02/15/2017); Colonoscopy with propofol ([name], 02/17/2017); VISCERAL ANGIOGRAPHY ([name], 03/05/2018); PERIPHERAL VASCULAR INTERVENTION (Left, 03/05/2018); VISCERAL ANGIOGRAPHY ([name], 03/08/2018); PERIPHERAL VASCULAR INTERVENTION (03/08/2018); IR Paracentesis (03/10/2018); IR IVC FILTER PLMT / S&I /IMG GUID/MOD SED (03/14/2018); Transurethral resection of bladder tumor ([name], 05/31/2018); Laparoscopic cholecystectomy; Tonsillectomy; Augmentation mammaplasty; Replacement total knee bilateral (Bilateral, 2012); Anterior cervical decomp/discectomy fusion (11/2007); Back surgery (1986); Cataract extraction w/ intraocular lens  implant, bilateral (Bilateral, 2006); Joint replacement; Cardiac catheterization (11/2009); Coronary angioplasty with stent (11/2009); and Coronary artery bypass  graft (2004).   Current Medications: Current Meds  Medication Sig   allopurinol (ZYLOPRIM) 100 MG tablet Take 100 mg by mouth daily.   aspirin EC 81 MG tablet Take 81 mg by mouth daily.   Aspirin-Salicylamide-Caffeine (BC HEADACHE POWDER PO) Take 1 packet by mouth as needed (for headaches).   atorvastatin (LIPITOR) 20 MG tablet Take 20 mg by mouth daily.   citalopram (CELEXA) 40 MG tablet Take 40 mg by mouth at bedtime.   [employer] D3 1000 units capsule Take 1,000 Units by mouth daily.   cyclobenzaprine (FLEXERIL) 10 MG tablet Take 10 mg by mouth 3 (three) times daily as needed for muscle spasms.    ferrous sulfate 325 (65 FE) MG tablet Take 325 mg by mouth daily with breakfast.   furosemide (LASIX) 40 MG tablet Take one and half tablets (60 mg) by mouth daily except Tue and Sat take two tablets (80 mg)   HYDROcodone-acetaminophen (NORCO) 5-325 MG tablet Take 1 tablet by mouth every 4 (four) hours as needed for moderate pain.   INCRUSE ELLIPTA 62.5 MCG/INH AEPB Inhale 1 puff into the lungs 2 (two) times daily.    Ipratropium-Albuterol (COMBIVENT) 20-100 MCG/ACT AERS respimat Inhale 1 puff into the lungs every 4 (four) hours as needed for wheezing or shortness of breath (or to help expel mucous).    levothyroxine (SYNTHROID) 25 MCG tablet TAKE 1 AND 1/2 TABLET DAILY, BEFORE BREAKFAST.   magnesium oxide (MAG-OX) 400 (241.3 Mg) MG tablet Take 1 tablet (400 mg total) by mouth 2 (two) times daily.   Melatonin 10 MG TABS Take 30 mg by mouth at bedtime.   neomycin-polymyxin-hydrocortisone (CORTISPORIN) 3.5-10000-1 OTIC suspension INSTILL 5 DROPS INTO LEFT EAR THREE TIMES FOR 7 DAYS.   nitroGLYCERIN (NITROSTAT) 0.4 MG SL tablet Place 1 tablet (0.4 mg total) under the tongue every 5 (five) minutes as needed for chest pain. For chest pain   omeprazole (PRILOSEC) 40 MG capsule Take 40 mg by mouth at bedtime.    Oxycodone HCl 10 MG TABS Take 10 mg by mouth every 6 (six) hours as needed (FOR  PAIN).    pilocarpine (PILOCAR) 1 % ophthalmic solution Place 1 drop into both eyes 2 (two) times daily.   potassium chloride (K-DUR,KLOR-CON) 10 MEQ tablet Take 2 tablets (20 mEq total) by mouth daily.   pramipexole (MIRAPEX) 0.25 MG tablet Take 0.25 mg by mouth at bedtime.   roflumilast (DALIRESP) 500  MCG TABS tablet Take 500 mcg by mouth every evening.    spironolactone (ALDACTONE) 25 MG tablet Take 25 mg by mouth daily.   traZODone (DESYREL) 50 MG tablet Take 50 mg by mouth at bedtime.      Allergies:   Ativan [lorazepam], Pitavastatin, Ropinirole, Zofran [ondansetron hcl], Zolpidem tartrate, and Penicillins   Social History   Tobacco Use   Smoking status: Former Smoker    Packs/day: 1.00    Years: 45.00    Pack years: 45.00    Types: Cigarettes    Quit date: 12/22/2000    Years since quitting: 18.7   Smokeless tobacco: Never Used  Substance Use Topics   Alcohol use: Never    Alcohol/week: 0.0 standard drinks    Frequency: Never   Drug use: Never     Family Hx: The patient's family history includes COPD in her father; Cancer in her mother, sister, and another family member; Colitis in an other family member; Heart disease in her father, sister, and another family member; Lung cancer in her father; Ovarian cancer in her mother.  ROS:   Please see the history of present illness.    Review of Systems  Constitution: Negative for fever.  Respiratory: Negative for cough.   Gastrointestinal: Negative for diarrhea, hematochezia, melena, nausea and vomiting.  Genitourinary: Negative for hematuria.   All other systems reviewed and are negative.   EKGs/Labs/Other Test Reviewed:    EKG:  EKG is  ordered today.  The ekg ordered today demonstrates normal sinus rhythm, HR 77, LAD, PACs, LVH, QTc 459, no acute changes  Recent Labs: 10/19/2018: Magnesium 2.1 11/01/2018: NT-Pro BNP 601 12/23/2018: B Natriuretic Peptide 102.8 12/24/2018: ALT 10; Hemoglobin 11.8; Platelets 249;  TSH 5.786 12/25/2018: BUN 19; Creatinine, Ser 1.11; Potassium 4.3; Sodium 138   Recent Lipid Panel Lab Results  Component Value Date/Time   CHOL 106 07/01/2018 08:00 AM   CHOL 98 05/23/2014 08:57 AM   TRIG 88 07/01/2018 08:00 AM   TRIG 83 05/23/2014 08:57 AM   HDL 38 (L) 07/01/2018 08:00 AM   HDL 35 (L) 05/23/2014 08:57 AM   CHOLHDL 2.8 07/01/2018 08:00 AM   CHOLHDL 4 03/03/2013 11:36 AM   LDLCALC 50 07/01/2018 08:00 AM   LDLCALC 46 05/23/2014 08:57 AM    Physical Exam:    VS:  BP (!) 120/56    Pulse (!) 54    Ht _0  (1.727 m)    Wt 177 lb 3.2 oz (80.4 kg)    SpO2 90%    BMI 26.94 kg/m     Wt Readings from Last 3 Encounters:  10/04/19 177 lb 3.2 oz (80.4 kg)  04/04/19 172 lb (78 kg)  12/30/18 165 lb 1.9 oz (74.9 kg)     Physical Exam  Constitutional: She is oriented to person, place, and time. She appears well-developed and well-nourished. No distress.  HENT:  Head: Normocephalic and atraumatic.  Eyes: No scleral icterus.  Neck: JVD: cannot assess JVD - patient sitting at 90 degrees. No thyromegaly present.  Cardiovascular: Normal rate, regular rhythm and normal heart sounds. Exam reveals no S3.  No murmur heard. Pulmonary/Chest: Effort normal. She has no wheezes. She has rales in the right lower field and the left lower field.  Abdominal: Soft. There is no hepatomegaly.  Musculoskeletal:        General: Edema (1-2+ LE edema up to the knees) present.  Lymphadenopathy:    She has no cervical adenopathy.  Neurological: She is alert  and oriented to person, place, and time.  Skin: Skin is warm and dry.  Psychiatric: She has a normal mood and affect.    ASSESSMENT & PLAN:    1. Acute on chronic diastolic CHF (congestive heart failure) ([place]) She is short of breath with minimal activity (NYHA 3b) and has significant LE swelling and rales on exam.  She feels like she has in the past when she was admitted with CHF.  She has taken extra Lasix at home without improvement.  I  have recommended admission to [facility] for IV diuresis.  I reviewed this with Dr. Curt Bears (attending MD) who agreed.  He has also seen the patient.    -Admit [facility] tele [facility]  -Start Lasix IV 60 mg twice daily  -Obtain labs: CMET, CBC, BNP  -Obtain CXR  2. Coronary artery disease involving native coronary artery of native heart with angina pectoris [facility]) She has a hx of CABG in 2004 and DES to LM in 2010.  Myoview in 2018 was low risk. She has had a few episodes of angina over the past few weeks.  These have been responsive to NTG.  Her ECG is unchanged.  Admit to [facility] for IV diuresis.  I suspect her chest pain is exacerbated by volume excess.  Obtain hs-Troponin x 2.  Consider repeat Myoview prior to DC if Troponin neg.    3. History of pulmonary embolism She is not a candidate for anticoagulation due to prior GI bleed.  She is s/p IVC filter.   4. Essential hypertension BP is controlled.   5. 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[place]) She is s/p prior SMA stenting.    6. Chronic obstructive pulmonary disease, unspecified COPD type ([place]) Overall stable without significant increase in cough.      Dispo:  Return for Follow up After Dischage from Hospital, w/ Dr. Harrington Challenger, or [name], PA-C.   Medication Adjustments/Labs and Tests Ordered: Current medicines are reviewed at length with the patient today.  Concerns regarding medicines are outlined above.  Tests Ordered: Orders Placed This Encounter  Procedures   EKG 12-Lead   Medication Changes: No orders of the defined types were placed in this encounter.   Signed, [name], PA-C  10/04/2019 4:48 PM    [facility] [address] [place], [place], [place]  [address] Phone: [phone]; Fax: [phone]

## 2019-10-04 NOTE — H&P (Addendum)
Cardiology Admission History and Physical:   Date:  10/04/2019   ID:  [name], DOB 05-12-1941, MRN [id number]  PCP:  [name], MD  Cardiologist:  Dorris Carnes, MD  Electrophysiologist:  None   Referring MD: [name], [name], MD   Chief Complaint  Patient presents with   Shortness of Breath    History of Present Illness:    [name] is a 78 y.o. female with:   Coronary artery disease   S/p CABG 2004  S/p DES to LM in 2010  Myoview 9/18: dist ant and apical ischemia; low risk  Chronic diastolic CHF  Last admit in 12/2018  Carotid artery disease  Recurrent DVT  anticoagulation DC'd in past due to GI bleed  admx in 3/19 with multiple bilat PEs with L femoral DVT  anticoagulation c/b large abd wall hemotoma >> s/p tx with PRBCs  S/p IVC filter  PAD  S/p SMA stenting in 2019 by [facility]  Hypertension   COPD  Cirrhosis  [name] was last seen by Dr. Harrington Challenger via Telemedicine in 03/2019. [name] presents today for further evaluation of shortness of breath.  [name] is here today with her daughter.  [name] has chronic dyspnea but has felt more short of breath over the past month.  [name] notes dyspnea with just minimal activity.  [name] also notes orthopnea as well as paroxysmal nocturnal dyspnea.  [name] has also had some episodes of chest discomfort.  Her last episode was a couple of weeks ago.  [name] took nitroglycerin with relief.  [name] has not had syncope.  Her oxygen levels have been running low and [name] has been using supplemental oxygen at home more often.  [name] has not had a cough.  [name] has not had fever.  [name] feels that her breathing has been getting worse and was actually prepared to be admitted to the hospital today.   Prior CV studies:   The following studies were reviewed today:  Carotid US 06/06/2019 Summary: Right Carotid: Velocities in the right ICA are consistent with a 1-39% stenosis.                Non-hemodynamically significant plaque <50% noted in the CCA.  The                RICA velocities remain within normal range and stable compared to                the previous exam.  Left Carotid: Velocities in the left ICA are consistent with a 40-59% stenosis,               based on peak systolic velocities and plaque formation.               Non-hemodynamically significant plaque noted in the CCA. The ECA               appears >50% stenosed. The LICA velocities are elevated and stable               compared to the previous exam.  Vertebrals:  Left vertebral artery demonstrates antegrade flow. Small caliber              right vertebral artery with atypical, antegrade flow. Subclavians: Bilateral subclavian arteries were stenotic. Right subclavian              artery flow was disturbed.  Echocardiogram 11/09/2018 Mild concentric LVH, EF 60-65, normal wall motion, grade 1 diastolic dysfunction, severe MAC,  trivial MR, mild BAE, trivial PI  Mesenteric vascular US 06/30/2018 Mesenteric: Normal Celiac artery , Splenic artery, Hepatic artery and Inferior Mesenteric artery findings. 70 to 99% stenosis in the superior mesenteric artery.  Mesenteric artery ultrasound 04/22/2018 FINAL INTERPRETATION: Mesenteric: 70 to 99% stenosis in the superior mesenteric artery, based on limited Visualization.  Echo 04/03/2018 EF 55-60, normal wall motion, MAC  Abdominal and chest CTA 04/01/2018 IMPRESSION: 1. No evidence of pulmonary embolism. Interval resolution of previously seen left lower lobe pulmonary emboli. 2. Unchanged moderate right and trace left pleural effusions with adjacent bibasilar atelectasis. 3. Relatively unchanged large anterior abdominal wall hematoma involving the space of Retzius, measuring 11.0 x 16.7 x 12.6 cm. 4. Aortic atherosclerosis (ICD10-I70.0).  Echo 03/02/2018 EF 55-60, normal wall motion, trivial AI, MAC, RVSP 30  Echo 09/21/2017 Mild LVH, EF 36-64, grade 1 diastolic dysfunction, mild AI, moderate to severe MAC, mild  MR, mild BAE  Nuclear stress test 08/31/2017  Nuclear stress EF: 65%.  Small region of distal anterior and apical ischemia, cannot exclude shifting breast attenuation. . Small anteroseptal defect consistent with probable soft tissue attenuation  This is a low risk study.  Carotid US 12/19/2015 Bilateral ICA 60-79, elevated R subclavian velocities Follow-up 1 year  Cardiac catheterization 12/18/2009 ([place]) LM ostial 90, D1 100 LAD proximal 100 OM 9 RCA diffuse disease RI proximal 100 LIMA-LAD patent SVG-OM1 patent SVG-RI/D1 100 PCI: 4 x 12 mm Xience DES to the LM   Past Medical History:  Diagnosis Date   Anemia    bld. transfusion post lumbar surgery- 2012   Anxiety    Arthralgia    NOS   Blood transfusion 2012; 02/2018   WITH BACK SURGERY; "related to hematoma in stomach" (10/06/2018)   CAD (coronary artery disease)    s/p CABG 2004; s/p DES to LM in 2010;  [place] 10/29/11: EF 50-55%, mild elevated filling pressures, no pulmonary HTN, LM 90% ISR, LAD and CFX occluded, S-RI occluded (old), S-OM3 ok and L-LAD ok, native nondominant RCA 95% -  med rx recommended ; Lexiscan Myoview 7/13 at [facility]: demonstrated "normal LV function, anterior attenuation and localized ischemia, inferior, basilar, mid section"   Carotid artery disease ([place])    Carotid US 4/03:  RICA 4-74; LICA 25-95; R subclavian stenosis - Repeat 1 year. // Carotid US 05/2019: R 1-39; L 40-59; R vertebral with atypical antegrade flow; R subclavian stenosis >> repeat 1 year   CHF (congestive heart failure) (HCC)    Chronic diastolic heart failure ([place])    Echo 9/10: EF 63-87%, grade 1 diastolic dysfunction   Chronic lower back pain    COPD (chronic obstructive pulmonary disease) ([place])    Emphysema dxed by Dr. Woody Seller in [place] based on PFTs per pt in 2006; placed on albuterol   Depression    TAKES CELEXA  AND  (OFF- WELBUTRIN)   Diuretic-induced hypokalemia 10/08/2018   DVT of lower extremity  (deep venous thrombosis) (HCC)    recurrent. bilateral (2 episodes)   Dyspnea    Dysrhythmia    afib with cabg   Exertional angina (HCC)    Treated with Isosorbide, Ranexa, amlodipine; intolerant to metoprolol   GERD (gastroesophageal reflux disease)    Gout    "on daily RX" (10/06/2018)   HLD (hyperlipidemia)    Hypertension    Hyperthyroidism    Obesity (BMI 30-39.9) 2009   BMI 33   Osteoarthritis    "all over; hands" (10/06/2018)   Oxygen deficiency  Oxygen dependent    4L at home as needed (10/06/2018)   Pneumonia    PVD (peripheral vascular disease) ([place])    Sleep apnea 2012   USED CPAP THEN  STARTED USING SPIRIVA , Nov. 2013- last evaluation , changed from mask to aparatus that is just for her nose.. ; reports 05-28-18 "the mask smothers me so i dont use it right now" (10/06/2018)   Surgical Hx: The patient  has a past surgical history that includes Ankle surgery (2004); Groin mass open biopsy (2004); Total hip arthroplasty (2007); Reverse shoulder arthroplasty (02/26/2012); Shoulder arthroscopy with subacromial decompression (Left, 02/10/2013); Esophagogastroduodenoscopy (N/A, 02/14/2017); Givens capsule study (N/A, 02/15/2017); Colonoscopy with propofol (N/A, 02/17/2017); VISCERAL ANGIOGRAPHY (N/A, 03/05/2018); PERIPHERAL VASCULAR INTERVENTION (Left, 03/05/2018); VISCERAL ANGIOGRAPHY (N/A, 03/08/2018); PERIPHERAL VASCULAR INTERVENTION (03/08/2018); IR Paracentesis (03/10/2018); IR IVC FILTER PLMT / S&I /IMG GUID/MOD SED (03/14/2018); Transurethral resection of bladder tumor (N/A, 05/31/2018); Laparoscopic cholecystectomy; Tonsillectomy; Augmentation mammaplasty; Replacement total knee bilateral (Bilateral, 2012); Anterior cervical decomp/discectomy fusion (11/2007); Back surgery (1986); Cataract extraction w/ intraocular lens  implant, bilateral (Bilateral, 2006); Joint replacement; Cardiac catheterization (11/2009); Coronary angioplasty with stent (11/2009); and Coronary artery  bypass graft (2004).   Current Medications: Current Meds  Medication Sig   allopurinol (ZYLOPRIM) 100 MG tablet Take 100 mg by mouth daily.   aspirin EC 81 MG tablet Take 81 mg by mouth daily.   Aspirin-Salicylamide-Caffeine (BC HEADACHE POWDER PO) Take 1 packet by mouth as needed (for headaches).   atorvastatin (LIPITOR) 20 MG tablet Take 20 mg by mouth daily.   citalopram (CELEXA) 40 MG tablet Take 40 mg by mouth at bedtime.   [employer] D3 1000 units capsule Take 1,000 Units by mouth daily.   cyclobenzaprine (FLEXERIL) 10 MG tablet Take 10 mg by mouth 3 (three) times daily as needed for muscle spasms.    ferrous sulfate 325 (65 FE) MG tablet Take 325 mg by mouth daily with breakfast.   furosemide (LASIX) 40 MG tablet Take one and half tablets (60 mg) by mouth daily except Tue and Sat take two tablets (80 mg)   HYDROcodone-acetaminophen (NORCO) 5-325 MG tablet Take 1 tablet by mouth every 4 (four) hours as needed for moderate pain.   INCRUSE ELLIPTA 62.5 MCG/INH AEPB Inhale 1 puff into the lungs 2 (two) times daily.    Ipratropium-Albuterol (COMBIVENT) 20-100 MCG/ACT AERS respimat Inhale 1 puff into the lungs every 4 (four) hours as needed for wheezing or shortness of breath (or to help expel mucous).    levothyroxine (SYNTHROID) 25 MCG tablet TAKE 1 AND 1/2 TABLET DAILY, BEFORE BREAKFAST.   magnesium oxide (MAG-OX) 400 (241.3 Mg) MG tablet Take 1 tablet (400 mg total) by mouth 2 (two) times daily.   Melatonin 10 MG TABS Take 30 mg by mouth at bedtime.   neomycin-polymyxin-hydrocortisone (CORTISPORIN) 3.5-10000-1 OTIC suspension INSTILL 5 DROPS INTO LEFT EAR THREE TIMES FOR 7 DAYS.   nitroGLYCERIN (NITROSTAT) 0.4 MG SL tablet Place 1 tablet (0.4 mg total) under the tongue every 5 (five) minutes as needed for chest pain. For chest pain   omeprazole (PRILOSEC) 40 MG capsule Take 40 mg by mouth at bedtime.    Oxycodone HCl 10 MG TABS Take 10 mg by mouth every 6 (six) hours as needed  (FOR PAIN).    pilocarpine (PILOCAR) 1 % ophthalmic solution Place 1 drop into both eyes 2 (two) times daily.   potassium chloride (K-DUR,KLOR-CON) 10 MEQ tablet Take 2 tablets (20 mEq total) by mouth daily.  pramipexole (MIRAPEX) 0.25 MG tablet Take 0.25 mg by mouth at bedtime.   roflumilast (DALIRESP) 500 MCG TABS tablet Take 500 mcg by mouth every evening.    spironolactone (ALDACTONE) 25 MG tablet Take 25 mg by mouth daily.   traZODone (DESYREL) 50 MG tablet Take 50 mg by mouth at bedtime.      Allergies:   Ativan [lorazepam], Pitavastatin, Ropinirole, Zofran [ondansetron hcl], Zolpidem tartrate, and Penicillins   Social History   Tobacco Use   Smoking status: Former Smoker    Packs/day: 1.00    Years: 45.00    Pack years: 45.00    Types: Cigarettes    Quit date: 12/22/2000    Years since quitting: 18.7   Smokeless tobacco: Never Used  Substance Use Topics   Alcohol use: Never    Alcohol/week: 0.0 standard drinks    Frequency: Never   Drug use: Never     Family Hx: The patient's family history includes COPD in her father; Cancer in her mother, sister, and another family member; Colitis in an other family member; Heart disease in her father, sister, and another family member; Lung cancer in her father; Ovarian cancer in her mother.  ROS:   Please see the history of present illness.    Review of Systems  Constitution: Negative for fever.  Respiratory: Negative for cough.   Gastrointestinal: Negative for diarrhea, hematochezia, melena, nausea and vomiting.  Genitourinary: Negative for hematuria.   All other systems reviewed and are negative.   EKGs/Labs/Other Test Reviewed:    EKG:  EKG is  ordered today.  The ekg ordered today demonstrates normal sinus rhythm, HR 77, LAD, PACs, LVH, QTc 459, no acute changes  Recent Labs: 10/19/2018: Magnesium 2.1 11/01/2018: NT-Pro BNP 601 12/23/2018: B Natriuretic Peptide 102.8 12/24/2018: ALT 10; Hemoglobin 11.8; Platelets  249; TSH 5.786 12/25/2018: BUN 19; Creatinine, Ser 1.11; Potassium 4.3; Sodium 138   Recent Lipid Panel Lab Results  Component Value Date/Time   CHOL 106 07/01/2018 08:00 AM   CHOL 98 05/23/2014 08:57 AM   TRIG 88 07/01/2018 08:00 AM   TRIG 83 05/23/2014 08:57 AM   HDL 38 (L) 07/01/2018 08:00 AM   HDL 35 (L) 05/23/2014 08:57 AM   CHOLHDL 2.8 07/01/2018 08:00 AM   CHOLHDL 4 03/03/2013 11:36 AM   LDLCALC 50 07/01/2018 08:00 AM   LDLCALC 46 05/23/2014 08:57 AM    Physical Exam:    VS:  BP (!) 120/56    Pulse (!) 54    Ht _0  (1.727 m)    Wt 177 lb 3.2 oz (80.4 kg)    SpO2 90%    BMI 26.94 kg/m     Wt Readings from Last 3 Encounters:  10/04/19 177 lb 3.2 oz (80.4 kg)  04/04/19 172 lb (78 kg)  12/30/18 165 lb 1.9 oz (74.9 kg)     Physical Exam  Constitutional: [name] is oriented to person, place, and time. [name] appears well-developed and well-nourished. No distress.  HENT:  Head: Normocephalic and atraumatic.  Eyes: No scleral icterus.  Neck: JVD: cannot assess JVD - patient sitting at 90 degrees. No thyromegaly present.  Cardiovascular: Normal rate, regular rhythm and normal heart sounds. Exam reveals no S3.  No murmur heard. Pulmonary/Chest: Effort normal. [name] has no wheezes. [name] has rales in the right lower field and the left lower field.  Abdominal: Soft. There is no hepatomegaly.  Musculoskeletal:        General: Edema (1-2+ LE edema up to the  knees) present.  Lymphadenopathy:    [name] has no cervical adenopathy.  Neurological: [name] is alert and oriented to person, place, and time.  Skin: Skin is warm and dry.  Psychiatric: [name] has a normal mood and affect.    ASSESSMENT & PLAN:    1. Acute on chronic diastolic CHF (congestive heart failure) ([place]) [name] is short of breath with minimal activity (NYHA 3b) and has significant LE swelling and rales on exam.  [name] feels like [name] has in the past when [name] was admitted with CHF.  [name] has taken extra Lasix at home without improvement.   I have recommended admission to [facility] for IV diuresis.  I reviewed this with Dr. Curt Bears (attending MD) who agreed.  He has also seen the patient.    -Admit to tele at [facility]  -Start Lasix IV 60 mg twice daily  -Obtain labs: CMET, CBC, BNP  -Obtain CXR  2. Coronary artery disease involving native coronary artery of native heart with angina pectoris [facility]) [name] has a hx of CABG in 2004 and DES to LM in 2010.  Myoview in 2018 was low risk. [name] has had a few episodes of angina over the past few weeks.  These have been responsive to NTG.  Her ECG is unchanged.  Admit to [facility] for IV diuresis.  I suspect her chest pain is exacerbated by volume excess.  Obtain hs-Troponin x 2.  Consider repeat Myoview prior to DC if Troponin neg.    3. History of pulmonary embolism [name] is not a candidate for anticoagulation due to prior GI bleed.  [name] is s/p IVC filter.   4. Essential hypertension BP is controlled.   5. 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[place]) [name] is s/p prior SMA stenting.    6. Chronic obstructive pulmonary disease, unspecified COPD type ([place]) Overall stable without significant increase in cough.      Severity of Illness: The appropriate patient status for this patient is INPATIENT. Inpatient status is judged to be reasonable and necessary in order to provide the required intensity of service to ensure the patient's safety. The patient's presenting symptoms, physical exam findings, and initial radiographic and laboratory data in the context of their chronic comorbidities is felt to place them at high risk for further clinical deterioration. Furthermore, it is not anticipated that the patient Aahil Fredin be medically stable for discharge from the hospital within 2 midnights of admission. The following factors support the patient status of inpatient.   " The patient's presenting symptoms include shortness of breath, orthopnea, paroxysmal nocturnal dyspnea, chest pain. " The worrisome physical exam  findings include leg swelling, bibasilar rales, low O2. " The initial radiographic and laboratory data are worrisome because of n/a. " The chronic co-morbidities include CHF, CAD, hx of pulmonary embolism, prior GI bleed, COPD, PAD.   * I certify that at the point of admission it is my clinical judgment that the patient Kalei Mckillop require inpatient hospital care spanning beyond 2 midnights from the point of admission due to high intensity of service, high risk for further deterioration and high frequency of surveillance required.*    For questions or updates, please contact [place] Please consult [email or web address] for contact info under     Signed, [name], PA-C  10/04/2019 5:08 PM    I have seen and examined this patient with [name].  Agree with above, note added to reflect my findings. Patient with increasing SOB and LE edema. Jaemarie Hochberg plan to admit to the hospital  for diuresis.    Addi Pak M. Bayan Kushnir MD 10/04/2019 7:08 PM

## 2019-10-05 ENCOUNTER — Observation Stay (HOSPITAL_COMMUNITY): Payer: Medicare HMO

## 2019-10-05 DIAGNOSIS — I10 Essential (primary) hypertension: Secondary | ICD-10-CM

## 2019-10-05 DIAGNOSIS — I5033 Acute on chronic diastolic (congestive) heart failure: Secondary | ICD-10-CM

## 2019-10-05 DIAGNOSIS — I509 Heart failure, unspecified: Secondary | ICD-10-CM | POA: Diagnosis not present

## 2019-10-05 DIAGNOSIS — Z86711 Personal history of pulmonary embolism: Secondary | ICD-10-CM

## 2019-10-05 DIAGNOSIS — I25119 Atherosclerotic heart disease of native coronary artery with unspecified angina pectoris: Secondary | ICD-10-CM

## 2019-10-05 DIAGNOSIS — Z95828 Presence of other vascular implants and grafts: Secondary | ICD-10-CM

## 2019-10-05 LAB — BASIC METABOLIC PANEL
Anion gap: 11 (ref 5–15)
BUN: 9 mg/dL (ref 8–23)
CO2: 33 mmol/L — ABNORMAL HIGH (ref 22–32)
Calcium: 8.9 mg/dL (ref 8.9–10.3)
Chloride: 99 mmol/L (ref 98–111)
Creatinine, Ser: 1.1 mg/dL — ABNORMAL HIGH (ref 0.44–1.00)
GFR calc Af Amer: 55 mL/min — ABNORMAL LOW (ref >60)
GFR calc non Af Amer: 48 mL/min — ABNORMAL LOW (ref >60)
Glucose, Bld: 99 mg/dL (ref 70–99)
Potassium: 3.7 mmol/L (ref 3.5–5.1)
Sodium: 143 mmol/L (ref 135–145)

## 2019-10-05 LAB — MAGNESIUM: Magnesium: 2 mg/dL (ref 1.7–2.4)

## 2019-10-05 LAB — TSH: TSH: 6.419 u[IU]/mL — ABNORMAL HIGH (ref 0.350–4.500)

## 2019-10-05 MED ORDER — POTASSIUM CHLORIDE CRYS ER 20 MEQ PO TBCR
40.0000 meq | EXTENDED_RELEASE_TABLET | Freq: Two times a day (BID) | ORAL | Status: DC
  Administered 2019-10-05 – 2019-10-06 (×3): 40 meq via ORAL
  Filled 2019-10-05 (×3): qty 2

## 2019-10-05 NOTE — Progress Notes (Addendum)
Progress Note  Patient Name: [name] Date of Encounter: 10/05/2019  Primary Cardiologist: Dorris Carnes, MD   Subjective  O/N event: N/A  JudyPatrick was examined and evaluated at bedside this AM. She states she feels much better than yesterday. She provides additional history with stating she had worsening dyspnea and fatigue about 3 days prior. She was seen by her pcp who increased her dose of furosemide w/o significant effect. She was seen yesterday at [facility] office and was told to come to [facility] for inpatient diuresis. She mentions having significant urine output overnight. Continues to endorse orthopnea but denies productive cough. Mentions some chest pain prior to admission but non currently. Denies high sodium intake or changes in diet. Does mention she takes 'ton of meds' and has trouble with her medications at home some time.  Inpatient Medications    Scheduled Meds: . allopurinol  100 mg Oral Daily  . aspirin EC  81 mg Oral Daily  . atorvastatin  20 mg Oral q1800  . cholecalciferol  1,000 Units Oral Daily  . citalopram  40 mg Oral QHS  . enoxaparin (LOVENOX) injection  40 mg Subcutaneous Daily  . ferrous sulfate  325 mg Oral Q breakfast  . furosemide  60 mg Intravenous Q12H  . levothyroxine  37.5 mcg Oral Q0600  . magnesium oxide  400 mg Oral BID  . Melatonin  3 mg Oral QHS  . neomycin-polymyxin-hydrocortisone  5 drop Right EAR BID  . pantoprazole  40 mg Oral Daily  . pilocarpine  1 drop Both Eyes BID  . potassium chloride  20 mEq Oral BID  . pramipexole  0.25 mg Oral QHS  . roflumilast  500 mcg Oral QPM  . sodium chloride flush  3 mL Intravenous Q12H  . spironolactone  25 mg Oral Daily  . traZODone  50 mg Oral QHS  . umeclidinium bromide  1 puff Inhalation BID   Continuous Infusions: . sodium chloride     PRN Meds: sodium chloride, acetaminophen, cyclobenzaprine, HYDROcodone-acetaminophen, ipratropium-albuterol, nitroGLYCERIN, sodium chloride flush    Vital Signs    Vitals:   10/05/19 0022 10/05/19 0432 10/05/19 0435 10/05/19 0819  BP:  130/64    Pulse:  75  76  Resp:  20  19  Temp:  98.4 F (36.9 C)    TempSrc:      SpO2:  98%  97%  Weight: 80.4 kg  79.3 kg   Height: 5\' 8"  (1.727 m)       Intake/Output Summary (Last 24 hours) at 10/05/2019 1017 Last data filed at 10/05/2019 0512 Gross per 24 hour  Intake 0 ml  Output 1000 ml  Net -1000 ml   Filed Weights   10/05/19 0022 10/05/19 0435  Weight: 80.4 kg 79.3 kg    Telemetry    Sinus arrythmia. - Personally Reviewed  ECG    Not performed yet - Personally Reviewed  Physical Exam   Gen: Well-developed, well nourished, NAD HEENT: NCAT head, hearing intact, EOMI Neck: supple, ROM intact, + JVD CV: Irregular, S1, S2 wnl, no murmurs, faded surgical scar Pulm: CTAB, + bibasilar rales louder on L side Abd: Soft, BS+, NTND, No rebound, no guarding Extm: ROM intact, Peripheral pulses intact, 1+ peripheral edema Skin: Dry, Warm, normal turgor, venous stasis changes Neuro: AAOx3  Labs    Chemistry Recent Labs  Lab 10/04/19 2021  NA 140  K 3.1*  CL 98  CO2 31  GLUCOSE 113*  BUN 10  CREATININE 1.12*  CALCIUM 8.9  PROT 6.2*  ALBUMIN 3.0*  AST 15  ALT 10  ALKPHOS 75  BILITOT 0.5  GFRNONAA 47*  GFRAA 54*  ANIONGAP 11     Hematology Recent Labs  Lab 10/04/19 2021  WBC 10.1  RBC 3.88  HGB 11.6*  HCT 35.5*  MCV 91.5  MCH 29.9  MCHC 32.7  RDW 13.4  PLT 263    Cardiac EnzymesNo results for input(s): TROPONINI in the last 168 hours. No results for input(s): TROPIPOC in the last 168 hours.   BNP Recent Labs  Lab 10/04/19 2021  BNP 232.5*     DDimer No results for input(s): DDIMER in the last 168 hours.   Radiology    No results found.  Cardiac Studies   TTE 11/09/2018 Study Conclusions - Left ventricle: The cavity size was normal. There was mild   concentric hypertrophy. Systolic function was normal. The   estimated ejection  fraction was in the range of 60% to 65%. Wall   motion was normal; there were no regional wall motion   abnormalities. There was an increased relative contribution of   atrial contraction to ventricular filling. Doppler parameters are   consistent with abnormal left ventricular relaxation (grade 1   diastolic dysfunction). Doppler parameters are consistent with   high ventricular filling pressure. - Aortic valve: Trileaflet; normal thickness, mildly calcified   leaflets. - Mitral valve: Severely calcified annulus. There was trivial   regurgitation. - Left atrium: The atrium was mildly dilated. - Right atrium: The atrium was mildly dilated. - Pulmonic valve: There was trivial regurgitation.   Patient Profile     78 y.o. female w/ PMH of HFpEF, CAD s/p CABG + DES, CAD, Recurrent DVT&PE s/p filter, hx of GI bleed, PAD, HTN, COPD and cirrhosis presenting from clinic w/ dyspnea 2/2 acute on chronic diastolic heart failure.  Assessment & Plan    Acute on Chronic diastolic heart failure Presents w/ worsening dyspnea. Hypervolemic on exam. BNP 232.5 Chest X-ray difficult to interpret due to implants but no effusions. Weight up 9kg from dry weight (70.5kg on d/c in 12/2018). Unclear trigger but possible missing doses of diuretics as JudyPatrick complains about polypharmacy at home. Prior Echo w/ Ef of 60-65%. Started on IV lasix overnight. Inaccurate urine output recording but 1L recorded overnight. - C/w IV Furosemide 60 BID - Check TSH - Echocardiogram - Trend BMP, Mag - Strict I&Os - Daily Weights - Fluid restriction - Keep O2 sat >88 - Replenish K as needed >4.0 - C/w home meds: spironolactone,   CAD s/p CABG + DES Stable Angina Mentions anginal pain prior to admission. HsTrop neg x2. Hx of significant coronary artery disease with multiple interventions. - C/w aspirin, atorvastatin - Nitroglycerin PRN for pain - Get 12-lead EKG  Recurrent DVT/PE Not on anticoagulation due to  recurrent bleed requiring transfusions. S/p IVC filter - DVT prophylaxis w/ lovenox  Hypothyroidism On levothyroxine 37.18mcg at home. Prior TSH have shown undertreated hypothyroidism - Check TSH - C/w levo 37.48mcg for now, may need to increase dose  COPD Chronic hypoxic resp failure On 4L oxygen at home. Currently satting 99 on 4L - C/w home meds: Incruse Ellipta, DuoNebs PRN  For questions or updates, please contact [place] Please consult [email or web address] for contact info under Cardiology/STEMI.   Signed, Gilberto Better, MD PGY-2, [place] [facility] Pager: [phone] 10/05/2019, 10:17 AM    Attending attestation to follow   I have seen and examined the patient along with Vonna Kotyk  Truman Hayward, MD.  I have reviewed the chart, notes and new data.  I agree with his note.  Key new complaints: improved dyspnea, edema Key examination changes: wrinkling feet/ankles c/w volume removal. In/out unreliable due to incontinence/leak. Key new findings / data: labs and repeat ECG pending. Normal trop.  PLAN: She did have some chest discomfort, will check echo for new wall motion abnormalities. However, it appears that her decompensation is due to dietary sodium indiscretion and some missed diuretic doses. Reviewed sodium restriction, daily weight monitoring, signs and sxs of CHF exacerbation. Needs approximately 10 kg diuresis.  Sanda Klein, MD, [place] [phone] 10/05/2019, 11:38 AM

## 2019-10-05 NOTE — Plan of Care (Signed)
?  Problem: Clinical Measurements: ?Goal: Will remain free from infection ?Outcome: Progressing ?  ?

## 2019-10-05 NOTE — Progress Notes (Signed)
  Echocardiogram 2D Echocardiogram has been performed.  [name] 10/05/2019, 2:48 PM

## 2019-10-06 LAB — BASIC METABOLIC PANEL
Anion gap: 10 (ref 5–15)
BUN: 14 mg/dL (ref 8–23)
CO2: 33 mmol/L — ABNORMAL HIGH (ref 22–32)
Calcium: 8.9 mg/dL (ref 8.9–10.3)
Chloride: 98 mmol/L (ref 98–111)
Creatinine, Ser: 0.98 mg/dL (ref 0.44–1.00)
GFR calc Af Amer: >60 mL/min (ref >60)
GFR calc non Af Amer: 55 mL/min — ABNORMAL LOW (ref >60)
Glucose, Bld: 97 mg/dL (ref 70–99)
Potassium: 4.2 mmol/L (ref 3.5–5.1)
Sodium: 141 mmol/L (ref 135–145)

## 2019-10-06 LAB — ECHOCARDIOGRAM COMPLETE
Height: 68 in
Weight: 2796.8 oz

## 2019-10-06 MED ORDER — LEVOTHYROXINE SODIUM 50 MCG PO TABS
50.0000 ug | ORAL_TABLET | Freq: Every day | ORAL | Status: DC
  Administered 2019-10-08: 50 ug via ORAL
  Filled 2019-10-06: qty 1

## 2019-10-06 NOTE — Progress Notes (Addendum)
Progress Note  Patient Name: [name] Date of Encounter: 10/06/2019  Primary Cardiologist: Dorris Carnes, MD   Subjective  O/N event: Echo pending  [name] was examined and evaluated at bedside this AM. She was observed eating breakfast at bedside chair. She states her dyspnea and fatigue has improved compared to yesterday and denies any chest pain, palpitations or light-headedness. She mentions continuing to have urinary output without difficulty.  Inpatient Medications    Scheduled Meds: . allopurinol  100 mg Oral Daily  . aspirin EC  81 mg Oral Daily  . atorvastatin  20 mg Oral q1800  . cholecalciferol  1,000 Units Oral Daily  . citalopram  40 mg Oral QHS  . enoxaparin (LOVENOX) injection  40 mg Subcutaneous Daily  . ferrous sulfate  325 mg Oral Q breakfast  . furosemide  60 mg Intravenous Q12H  . levothyroxine  37.5 mcg Oral Q0600  . magnesium oxide  400 mg Oral BID  . Melatonin  3 mg Oral QHS  . neomycin-polymyxin-hydrocortisone  5 drop Right EAR BID  . pantoprazole  40 mg Oral Daily  . pilocarpine  1 drop Both Eyes BID  . potassium chloride  40 mEq Oral BID  . pramipexole  0.25 mg Oral QHS  . roflumilast  500 mcg Oral QPM  . sodium chloride flush  3 mL Intravenous Q12H  . spironolactone  25 mg Oral Daily  . traZODone  50 mg Oral QHS  . umeclidinium bromide  1 puff Inhalation BID   Continuous Infusions: . sodium chloride     PRN Meds: sodium chloride, acetaminophen, cyclobenzaprine, HYDROcodone-acetaminophen, ipratropium-albuterol, nitroGLYCERIN, sodium chloride flush   Vital Signs    Vitals:   10/05/19 1609 10/05/19 1952 10/05/19 2114 10/06/19 0313  BP: (!) 138/50 (!) 129/51  (!) 131/56  Pulse: 91 76  72  Resp: (!) 21 18  16   Temp: 98.4 F (36.9 C) 98.3 F (36.8 C)  98.3 F (36.8 C)  TempSrc: Oral Oral  Oral  SpO2: 91% 96% 96% 96%  Weight:    77.7 kg  Height:        Intake/Output Summary (Last 24 hours) at 10/06/2019 0720 Last data filed at  10/06/2019 0331 Gross per 24 hour  Intake 240 ml  Output 1320 ml  Net -1080 ml   Filed Weights   10/05/19 0022 10/05/19 0435 10/06/19 0313  Weight: 80.4 kg 79.3 kg 77.7 kg    Telemetry    Sinus with frequent PACs - Personally Reviewed  ECG   Sinus rhythm with PACs + 1 PVC, No ST changes - Personally Reviewed  Physical Exam   Gen: Well-developed, well nourished, NAD HEENT: NCAT head, hearing intact, EOMI Neck: supple, ROM intact, + JVD CV: Irregular, S1, S2 wnl, no murmurs, faded surgical scar Pulm: CTAB, minimal bibasilar rales Abd: Soft, BS+, NTND, No rebound, no guarding Extm: ROM intact, Peripheral pulses intact, trace ankle pitting edema Skin: Dry, Warm, normal turgor, venous stasis changes Neuro: AAOx3  Labs    Chemistry Recent Labs  Lab 10/04/19 2021 10/05/19 1056 10/06/19 0324  NA 140 143 141  K 3.1* 3.7 4.2  CL 98 99 98  CO2 31 33* 33*  GLUCOSE 113* 99 97  BUN 10 9 14   CREATININE 1.12* 1.10* 0.98  CALCIUM 8.9 8.9 8.9  PROT 6.2*  --   --   ALBUMIN 3.0*  --   --   AST 15  --   --   ALT 10  --   --  ALKPHOS 75  --   --   BILITOT 0.5  --   --   GFRNONAA 47* 48* 55*  GFRAA 54* 55* >60  ANIONGAP 11 11 10      Hematology Recent Labs  Lab 10/04/19 2021  WBC 10.1  RBC 3.88  HGB 11.6*  HCT 35.5*  MCV 91.5  MCH 29.9  MCHC 32.7  RDW 13.4  PLT 263    Cardiac EnzymesNo results for input(s): TROPONINI in the last 168 hours. No results for input(s): TROPIPOC in the last 168 hours.   BNP Recent Labs  Lab 10/04/19 2021  BNP 232.5*     DDimer No results for input(s): DDIMER in the last 168 hours.   Radiology    Dg Chest 2 View  Result Date: 10/05/2019 CLINICAL DATA:  CHF EXAM: CHEST - 2 VIEW COMPARISON:  03/08/2019 FINDINGS: Cardiomegaly status post median sternotomy. Aortic atherosclerosis. Both lungs are clear. Status post right shoulder reverse total arthroplasty. Disc degenerative disease of the thoracic spine. Partially imaged lumbar  fusion. Calcified bilateral breast implants IMPRESSION: Cardiomegaly without acute abnormality of the lungs. Electronically Signed   By: Eddie Candle M.D.   On: 10/05/2019 12:08    Cardiac Studies   TTE 11/09/2018 Study Conclusions - Left ventricle: The cavity size was normal. There was mild   concentric hypertrophy. Systolic function was normal. The   estimated ejection fraction was in the range of 60% to 65%. Wall   motion was normal; there were no regional wall motion   abnormalities. There was an increased relative contribution of   atrial contraction to ventricular filling. Doppler parameters are   consistent with abnormal left ventricular relaxation (grade 1   diastolic dysfunction). Doppler parameters are consistent with   high ventricular filling pressure. - Aortic valve: Trileaflet; normal thickness, mildly calcified   leaflets. - Mitral valve: Severely calcified annulus. There was trivial   regurgitation. - Left atrium: The atrium was mildly dilated. - Right atrium: The atrium was mildly dilated. - Pulmonic valve: There was trivial regurgitation.   Patient Profile     78 y.o. female w/ PMH of HFpEF, CAD s/p CABG + DES, CAD, Recurrent DVT&PE s/p filter, hx of GI bleed, PAD, HTN, COPD and cirrhosis presenting from clinic w/ dyspnea 2/2 acute on chronic diastolic heart failure.  Assessment & Plan    Acute on Chronic diastolic heart failure Echo pending. 1.3L urine output yesterday. Weight down to 77.7kg. On exam still volume up but significantly less edema. K 4.2, Creatinine 0.98 this am - C/w IV Furosemide 60 BID - F/u Echocardiogram - Trend BMP, Mag - Strict I&Os - Daily Weights - Fluid restriction - Keep O2 sat >88 - Replenish K as needed >4.0 - C/w home meds: spironolactone  CAD s/p CABG + DES Stable Angina Mentions anginal pain prior to admission. HsTrop neg x2. Hx of significant coronary artery disease with multiple interventions. EKG w/o new ST changes -  C/w aspirin, atorvastatin - Nitroglycerin PRN for pain - F/u Echo for wall motion abnormalities  Recurrent DVT/PE Not on anticoagulation due to recurrent bleed requiring transfusions. S/p IVC filter - DVT prophylaxis w/ lovenox  Hypothyroidism On levothyroxine 37.45mcg at home. Prior TSH have shown undertreated hypothyroidism. TSH this admission 6.419.  - Increase levo dose to 33mcg  COPD Chronic hypoxic resp failure On 4L oxygen at home. Currently satting 99 on 4L - C/w home meds: Incruse Ellipta, DuoNebs PRN  For questions or updates, please contact [place] Please consult  [email or web address] for contact info under Cardiology/STEMI.   Signed, Judy Better, MD PGY-2, [place] [facility] Pager: [phone] 10/06/2019, 7:20 AM    Attending attestation to follow  I have seen and examined the patient along with Judy Better, MD.  I have reviewed the chart, notes and new data.  I agree with PA/NP's note.  Key new complaints: Feeling much [name] but still unable to sleep flat Key examination changes: Edema has virtually resolved.  No gallop. Key new findings / data: Improving renal parameters.  PLAN: Continue intravenous diuretics for at least another 24 hours.  According to previous admission target dry weight is 70 kg, but I am not sure we will be able to achieve that.  She may have gained some real weight.  Anticipate discharge no sooner than Saturday.  Sanda Klein, MD, [place] [phone] 10/06/2019, 8:43 AM

## 2019-10-07 LAB — BASIC METABOLIC PANEL
Anion gap: 12 (ref 5–15)
BUN: 15 mg/dL (ref 8–23)
CO2: 33 mmol/L — ABNORMAL HIGH (ref 22–32)
Calcium: 9.3 mg/dL (ref 8.9–10.3)
Chloride: 96 mmol/L — ABNORMAL LOW (ref 98–111)
Creatinine, Ser: 1.14 mg/dL — ABNORMAL HIGH (ref 0.44–1.00)
GFR calc Af Amer: 53 mL/min — ABNORMAL LOW (ref >60)
GFR calc non Af Amer: 46 mL/min — ABNORMAL LOW (ref >60)
Glucose, Bld: 111 mg/dL — ABNORMAL HIGH (ref 70–99)
Potassium: 4.1 mmol/L (ref 3.5–5.1)
Sodium: 141 mmol/L (ref 135–145)

## 2019-10-07 MED ORDER — FUROSEMIDE 80 MG PO TABS
80.0000 mg | ORAL_TABLET | Freq: Every day | ORAL | Status: DC
  Administered 2019-10-07 – 2019-10-08 (×2): 80 mg via ORAL
  Filled 2019-10-07 (×3): qty 1

## 2019-10-07 NOTE — Consult Note (Signed)
   [facility] CM Inpatient Consult   10/07/2019  [name] 1941/04/22 [id number]    Patient checked for potential [facility] Care Management services as benefit from [facility] plan witha19% risk score forunplanned readmissionand hospitalization. .  Noted that she had been engaged by [facility] RN-CM, Pharmacy and SW in the past.  Our community based plan of care has focused on disease management and community resource support.  Patient's chartreviewedand MD notesrevealas follows:   78 y.o. female w/ PMH of HFpEF, CAD s/p CABG + DES, CAD, Recurrent DVT&PE s/p filter, hx of GI bleed, PAD, HTN, COPD and cirrhosis presenting from clinic w/ dyspnea 2/2 acute on chronic diastolic heart failure.  Called and spoke to patient over the hospital room phone. HIPAA verified. Patient reports that plan is for her to discharge home tomorrow. [facility] care management services discussed with patient for possible discharge needs but she denies any and states that currently has none identified as far as medication (self-managed in blister packs); pharmacy ([facility] pt. reports having a lot of medications but declined [place] services stating that her pharmacist lives nearby her, and looks after her medications); transportation (patient drives, but granddaughter transports her whenever needed). She reportsliving at home alone but has a granddaughter Lilia Pro- lives closeby) who provides assistance with care needs and patient's daughters are also providing support for her. She mentioned that her "mind still clear and able to take care of self". Patient verbalized good understanding of ways to manage her chronic health issues like HF (Na restricted diet, keeping active, taking medications as ordered and calling the doctor- [PCP/cardiologist] for reportable weight gains and for help).  She endorses that her primary care provider is Dr.Jason [name] with [facility].  Patient has only agreed for [facility] to  follow-up with hercontinued recovery post hospitalization.  Referral made for EMMI HF calls to follow-up after discharge.   For questions and referrals, please contact:  Edwena Felty A. Myreon Wimer, BSN, RN-BC [facility] Liaison Cell: [phone]

## 2019-10-07 NOTE — Progress Notes (Addendum)
Progress Note  Patient Name: [name] Date of Encounter: 10/07/2019  Primary Cardiologist: Dorris Carnes, MD   Subjective  O/N event: 3.7L urine output  [name] was examined and evaluated at bedside this AM. She mentions continued improvement in dyspnea and leg edema. Now able to lie more flat w/o significant distress. Denies any chest pain, palpitations, worsening dyspnea or light-headedness. Mentions continuing to urinate large amount.  Inpatient Medications    Scheduled Meds: . allopurinol  100 mg Oral Daily  . aspirin EC  81 mg Oral Daily  . atorvastatin  20 mg Oral q1800  . cholecalciferol  1,000 Units Oral Daily  . citalopram  40 mg Oral QHS  . enoxaparin (LOVENOX) injection  40 mg Subcutaneous Daily  . ferrous sulfate  325 mg Oral Q breakfast  . furosemide  60 mg Intravenous Q12H  . levothyroxine  50 mcg Oral Q0600  . magnesium oxide  400 mg Oral BID  . Melatonin  3 mg Oral QHS  . neomycin-polymyxin-hydrocortisone  5 drop Right EAR BID  . pantoprazole  40 mg Oral Daily  . pilocarpine  1 drop Both Eyes BID  . potassium chloride  40 mEq Oral BID  . pramipexole  0.25 mg Oral QHS  . roflumilast  500 mcg Oral QPM  . sodium chloride flush  3 mL Intravenous Q12H  . spironolactone  25 mg Oral Daily  . traZODone  50 mg Oral QHS  . umeclidinium bromide  1 puff Inhalation BID   Continuous Infusions: . sodium chloride     PRN Meds: sodium chloride, acetaminophen, cyclobenzaprine, HYDROcodone-acetaminophen, ipratropium-albuterol, nitroGLYCERIN, sodium chloride flush   Vital Signs    Vitals:   10/06/19 1323 10/06/19 1958 10/06/19 2033 10/07/19 0446  BP: (!) 137/49 (!) 126/53  (!) 153/68  Pulse: 82 72  81  Resp: 18 18  19   Temp: 98.6 F (37 C) 98.3 F (36.8 C)  98.1 F (36.7 C)  TempSrc: Oral Oral  Oral  SpO2: 97% 92% 95% 97%  Weight:    75.6 kg  Height:        Intake/Output Summary (Last 24 hours) at 10/07/2019 0659 Last data filed at 10/07/2019 0452  Gross per 24 hour  Intake 600 ml  Output 3700 ml  Net -3100 ml   Filed Weights   10/05/19 0435 10/06/19 0313 10/07/19 0446  Weight: 79.3 kg 77.7 kg 75.6 kg    Telemetry    Sinus with frequent PACs - Personally Reviewed  ECG   Sinus rhythm with PACs + 1 PVC, No ST changes - Personally Reviewed  Physical Exam   Gen: Well-developed, well nourished, NAD HEENT: NCAT head, hearing intact, EOMI Neck: supple, ROM intact, minimal JVD CV: Irregular, S1, S2 wnl, no murmurs, faded surgical scar Pulm: CTAB, minimal bibasilar rales Abd: Soft, BS+, NTND, No rebound, no guarding Extm: ROM intact, Peripheral pulses intact, no pitting edema Skin: Dry, Warm, normal turgor Neuro: AAOx3  Labs    Chemistry Recent Labs  Lab 10/04/19 2021 10/05/19 1056 10/06/19 0324 10/07/19 0211  NA 140 143 141 141  K 3.1* 3.7 4.2 4.1  CL 98 99 98 96*  CO2 31 33* 33* 33*  GLUCOSE 113* 99 97 111*  BUN 10 9 14 15   CREATININE 1.12* 1.10* 0.98 1.14*  CALCIUM 8.9 8.9 8.9 9.3  PROT 6.2*  --   --   --   ALBUMIN 3.0*  --   --   --   AST 15  --   --   --  ALT 10  --   --   --   ALKPHOS 75  --   --   --   BILITOT 0.5  --   --   --   GFRNONAA 47* 48* 55* 46*  GFRAA 54* 55* >60 53*  ANIONGAP 11 11 10 12      Hematology Recent Labs  Lab 10/04/19 2021  WBC 10.1  RBC 3.88  HGB 11.6*  HCT 35.5*  MCV 91.5  MCH 29.9  MCHC 32.7  RDW 13.4  PLT 263    Cardiac EnzymesNo results for input(s): TROPONINI in the last 168 hours. No results for input(s): TROPIPOC in the last 168 hours.   BNP Recent Labs  Lab 10/04/19 2021  BNP 232.5*     DDimer No results for input(s): DDIMER in the last 168 hours.   Radiology    Dg Chest 2 View  Result Date: 10/05/2019 CLINICAL DATA:  CHF EXAM: CHEST - 2 VIEW COMPARISON:  03/08/2019 FINDINGS: Cardiomegaly status post median sternotomy. Aortic atherosclerosis. Both lungs are clear. Status post right shoulder reverse total arthroplasty. Disc degenerative disease  of the thoracic spine. Partially imaged lumbar fusion. Calcified bilateral breast implants IMPRESSION: Cardiomegaly without acute abnormality of the lungs. Electronically Signed   By: Eddie Candle M.D.   On: 10/05/2019 12:08    Cardiac Studies   TTE 10/05/19 IMPRESSIONS  1. Left ventricular ejection fraction, by visual estimation, is 55 to 60%. The left ventricle has normal function. There is mildly increased left ventricular hypertrophy.  2. Elevated mean left atrial pressure.  3. Left ventricular diastolic Doppler parameters are consistent with pseudonormalization pattern of LV diastolic filling.  4. Global right ventricle has normal systolic function.The right ventricular size is normal. No increase in right ventricular wall thickness.  5. Left atrial size was moderately dilated.  6. Right atrial size was moderately dilated.  7. Moderate mitral annular calcification.  8. The mitral valve is degenerative. Mild mitral valve regurgitation.  9. The tricuspid valve is normal in structure. Tricuspid valve regurgitation is trivial. 10. The aortic valve is normal in structure. Aortic valve regurgitation was not visualized by color flow Doppler. 11. The pulmonic valve was grossly normal. Pulmonic valve regurgitation is not visualized by color flow Doppler. 12. Mildly elevated pulmonary artery systolic pressure. 13. The inferior vena cava is dilated in size with   Patient Profile     78 y.o. female w/ PMH of HFpEF, CAD s/p CABG + DES, CAD, Recurrent DVT&PE s/p filter, hx of GI bleed, PAD, HTN, COPD and cirrhosis presenting from clinic w/ dyspnea 2/2 acute on chronic diastolic heart failure.  Assessment & Plan    Acute on Chronic diastolic heart failure Echo showing preserved EF. Diastolic parameters w/ pseudonomrlization. 3.7L urine output yesterday. Weight down to 77.7kg->75.6kg K 4.2, Creatinine 0.98->1.14. Renal fx worsening. Will stop IV diuresis and switch to oral w/ worsening renal fx.  Suspect this may be her new dry weight. - Switch to PO furosemide 60mg  BID - Trend BMP, Mag - Strict I&Os - Daily Weights - Fluid restriction - Keep O2 sat >88 - Replenish K as needed >4.0 - C/w home meds: spironolactone  CAD s/p CABG + DES Stable Angina Mentions anginal pain prior to admission. HsTrop neg x2. Hx of significant coronary artery disease with multiple interventions. EKG w/o new ST changes. No new wall motion abnormalities on TTE - C/w aspirin, atorvastatin - Nitroglycerin PRN for pain  Recurrent DVT/PE Not on anticoagulation due to recurrent bleed  requiring transfusions. S/p IVC filter - DVT prophylaxis w/ lovenox  COPD Chronic hypoxic resp failure On 4L oxygen at home. Currently satting 99 on 4L - C/w home meds: Incruse Ellipta, DuoNebs PRN  For questions or updates, please contact [place] Please consult [email or web address] for contact info under Cardiology/STEMI.   Signed, Gilberto Better, MD PGY-2, [place] [facility] Pager: [phone] 10/07/2019, 6:59 AM    Attending attestation to follow  I have seen and examined the patient along with Gilberto Better, MD.  I have reviewed the chart, notes and new data.  I agree with his note.  Key new complaints: Feels substantially improved.  Breathing seems to back to baseline.  Edema has resolved. Key examination changes: Weight down to roughly 75 kg.  This is substantially higher than her weight after her hospitalization in January, but she appears to be euvolemic by clinical exam. Key new findings / data: Slight increase in creatinine with diuresis.  PLAN: I think we have reached euvolemia.  Her new "dry weight" is 75-76 kg.  It is possible that she has gained some true weight during the Covid-19 isolation. Transition to oral diuretics and monitor for 24 hours to make sure she maintains a neutral fluid balance.  Stop potassium supplements since she is on spironolactone.  Had another detailed discussion regarding the  importance of sodium restriction and daily weight monitoring.  Instructed her to keep a log of her weights and have it available when we do her follow-up appointment.  Transition of care follow-up in 1-2 weeks.  Sanda Klein, MD, [place] [phone] 10/07/2019, 10:04 AM

## 2019-10-07 NOTE — Progress Notes (Signed)
   TELEPHONE CALL NOTE  This patient has been deemed a candidate for follow-up tele-health visit to limit community exposure during the Covid-19 pandemic. I spoke with the patient via phone to discuss instructions. This has been outlined on the patient's AVS (dotphrase: [email or web address]). The patient was advised to review the section on consent for treatment as well. The patient will receive a phone call 2-3 days prior to their E-Visit at which time consent will be verbally confirmed.   A Virtual Office Visit appointment type has been scheduled for hospital follow up with [name], [place] on 10/21/2019, with "VIDEO" or "TELEPHONE" in the appointment notes - patient prefers VIDEO type.  I have either confirmed the patient is active in MyChart or offered to send sign-up link to phone/email via Mychart icon beside patient's photo.  [place], [place] 10/07/2019 10:26 AM

## 2019-10-07 NOTE — Care Management Important Message (Signed)
Important Message  Patient Details  Name: [name] MRN: [id number] Date of Birth: 01/02/1941   Medicare Important Message Given:  Yes     Shelda Altes 10/07/2019, 12:10 PM

## 2019-10-07 NOTE — Discharge Instructions (Signed)
1. Weigh yourself EVERY morning after you go to the bathroom but before you eat or drink anything. Write this number down in a weight log/diary. If you gain 3 pounds overnight or 5 pounds in a week, call the office. 2. Take your medicines as prescribed. If you have concerns about your medications, please call us before you stop taking them.  3. Eat low salt foods--Limit salt (sodium) to 2000 mg per day. This will help prevent your body from holding onto fluid. Read food labels as many processed foods have a lot of sodium, especially canned goods and prepackaged meats. If you would like some assistance choosing low sodium foods, we would be happy to set you up with a nutritionist. 4. Stay as active as you can everyday. Staying active will give you more energy and make your muscles stronger. Start with 5 minutes at a time and work your way up to 30 minutes a day. Break up your activities--do some in the morning and some in the afternoon. Start with 3 days per week and work your way up to 5 days as you can.  If you have chest pain, feel short of breath, dizzy, or lightheaded, STOP. If you don't feel better after a short rest, call 911. If you do feel better, call the office to let us know you have symptoms with exercise. 5. Limit all fluids for the day to less than 2 liters. Fluid includes all drinks, coffee, juice, ice chips, soup, jello, and all other liquids.    YOUR CARDIOLOGY TEAM HAS ARRANGED FOR AN E-VISIT FOR YOUR APPOINTMENT - PLEASE REVIEW IMPORTANT INFORMATION BELOW SEVERAL DAYS PRIOR TO YOUR APPOINTMENT  Due to the recent COVID-19 pandemic, we are transitioning in-person office visits to tele-medicine visits in an effort to decrease unnecessary exposure to our patients, their families, and staff. These visits are billed to your insurance just like a normal visit is. We also encourage you to sign up for MyChart if you have not already done so. You will need a smartphone if possible. For patients  that do not have this, we can still complete the visit using a regular telephone but do prefer a smartphone to enable video when possible. You may have a family member that lives with you that can help. If possible, we also ask that you have a blood pressure cuff and scale at home to measure your blood pressure, heart rate and weight prior to your scheduled appointment. Patients with clinical needs that need an in-person evaluation and testing will still be able to come to the office if absolutely necessary. If you have any questions, feel free to call our office.   IF USING DOXIMITY or [email or web address] - The staff will give you instructions on receiving your link to join the meeting the day of your visit.   2-3 DAYS BEFORE YOUR APPOINTMENT  You will receive a telephone call from one of our [facility] team members - your caller ID may say "Unknown caller." If this is a video visit, we will walk you through how to get the video launched on your phone. We will remind you check your blood pressure, heart rate and weight prior to your scheduled appointment. If you have an Apple Watch or Kardia, please upload any pertinent ECG strips the day before or morning of your appointment to [facility]. Our staff will also make sure you have reviewed the consent and agree to move forward with your scheduled tele-health visit.     THE DAY  OF YOUR APPOINTMENT  Approximately 15 minutes prior to your scheduled appointment, you will receive a telephone call from one of [facility] team - your caller ID may say "Unknown caller."  Our staff will confirm medications, vital signs for the day and any symptoms you may be experiencing. Please have this information available prior to the time of visit start. It may also be helpful for you to have a pad of paper and pen handy for any instructions given during your visit. They will also walk you through joining the smartphone meeting if this is a video visit.    CONSENT FOR TELE-HEALTH VISIT -  PLEASE REVIEW  I hereby voluntarily request, consent and authorize [facility] and its employed or contracted physicians, physician assistants, nurse practitioners or other licensed health care professionals (the Practitioner), to provide me with telemedicine health care services (the Services") as deemed necessary by the treating Practitioner. I acknowledge and consent to receive the Services by the Practitioner via telemedicine. I understand that the telemedicine visit will involve communicating with the Practitioner through live audiovisual communication technology and the disclosure of certain medical information by electronic transmission. I acknowledge that I have been given the opportunity to request an in-person assessment or other available alternative prior to the telemedicine visit and am voluntarily participating in the telemedicine visit.  I understand that I have the right to withhold or withdraw my consent to the use of telemedicine in the course of my care at any time, without affecting my right to future care or treatment, and that the Practitioner or I may terminate the telemedicine visit at any time. I understand that I have the right to inspect all information obtained and/or recorded in the course of the telemedicine visit and may receive copies of available information for a reasonable fee.  I understand that some of the potential risks of receiving the Services via telemedicine include:   Delay or interruption in medical evaluation due to technological equipment failure or disruption;  Information transmitted may not be sufficient (e.g. poor resolution of images) to allow for appropriate medical decision making by the Practitioner; and/or   In rare instances, security protocols could fail, causing a breach of personal health information.  Furthermore, I acknowledge that it is my responsibility to provide information about my medical history, conditions and care that is complete  and accurate to the best of my ability. I acknowledge that Practitioner's advice, recommendations, and/or decision may be based on factors not within their control, such as incomplete or inaccurate data provided by me or distortions of diagnostic images or specimens that may result from electronic transmissions. I understand that the practice of medicine is not an exact science and that Practitioner makes no warranties or guarantees regarding treatment outcomes. I acknowledge that I will receive a copy of this consent concurrently upon execution via email to the email address I last provided but may also request a printed copy by calling the office of [facility].    I understand that my insurance will be billed for this visit.   I have read or had this consent read to me.  I understand the contents of this consent, which adequately explains the benefits and risks of the Services being provided via telemedicine.   I have been provided ample opportunity to ask questions regarding this consent and the Services and have had my questions answered to my satisfaction.  I give my informed consent for the services to be provided through the use of  telemedicine in my medical care  By participating in this telemedicine visit I agree to the above.

## 2019-10-08 LAB — BASIC METABOLIC PANEL
Anion gap: 9 (ref 5–15)
BUN: 18 mg/dL (ref 8–23)
CO2: 31 mmol/L (ref 22–32)
Calcium: 9.4 mg/dL (ref 8.9–10.3)
Chloride: 97 mmol/L — ABNORMAL LOW (ref 98–111)
Creatinine, Ser: 1.1 mg/dL — ABNORMAL HIGH (ref 0.44–1.00)
GFR calc Af Amer: 55 mL/min — ABNORMAL LOW (ref >60)
GFR calc non Af Amer: 48 mL/min — ABNORMAL LOW (ref >60)
Glucose, Bld: 97 mg/dL (ref 70–99)
Potassium: 4.2 mmol/L (ref 3.5–5.1)
Sodium: 137 mmol/L (ref 135–145)

## 2019-10-08 MED ORDER — FUROSEMIDE 80 MG PO TABS: 80.0000 mg | ORAL_TABLET | Freq: Every day | ORAL | 3 refills | Status: DC

## 2019-10-08 MED ORDER — LEVOTHYROXINE SODIUM 50 MCG PO TABS: 50.0000 ug | ORAL_TABLET | Freq: Every day | ORAL | 3 refills | Status: DC

## 2019-10-08 NOTE — Discharge Summary (Signed)
Discharge Summary    Patient ID: [name] MRN: [id number]; DOB: November 01, 1941  Admit date: 10/04/2019 Discharge date: 10/08/2019  Primary Care Provider: [name], MD  Primary Cardiologist: Dorris Carnes, MD Primary Electrophysiologist:  None  Discharge Diagnoses    Principal Problem:   Acute on chronic diastolic heart failure [facility]) Active Problems:   Hypothyroidism   Essential hypertension   COPD (chronic obstructive pulmonary disease) ([place])   Coronary artery disease involving native coronary artery of native heart with angina pectoris ([place])   History of pulmonary embolism   Allergies Allergies  Allergen Reactions  . Ativan [Lorazepam] Other (See Comments)    Confusion   . Pitavastatin Other (See Comments)    LIVALO: reaction not recalled  . Ropinirole Nausea Only and Other (See Comments)    Makes legs jump, also  . Zofran [Ondansetron Hcl] Nausea Only  . Zolpidem Tartrate Anxiety and Other (See Comments)    CONFUSION   . Penicillins Rash and Other (See Comments)    Tolerated cephalosporins in past Has patient had a PCN reaction causing immediate rash, facial/tongue/throat swelling, SOB or lightheadedness with hypotension: Yes Has patient had a PCN reaction causing severe rash involving mucus membranes or skin necrosis: No Has patient had a PCN reaction that required hospitalization: No Has patient had a PCN reaction occurring within the last 10 years: No If all of the above answers are "NO", then may proceed with Cephalosporin use.     Diagnostic Studies/Procedures    Echocardiogram 10/05/2019 IMPRESSIONS   1. Left ventricular ejection fraction, by visual estimation, is 55 to 60%. The left ventricle has normal function. There is mildly increased left ventricular hypertrophy.  2. Elevated mean left atrial pressure.  3. Left ventricular diastolic Doppler parameters are consistent with pseudonormalization pattern of LV diastolic filling.  4. Global right  ventricle has normal systolic function.The right ventricular size is normal. No increase in right ventricular wall thickness.  5. Left atrial size was moderately dilated.  6. Right atrial size was moderately dilated.  7. Moderate mitral annular calcification.  8. The mitral valve is degenerative. Mild mitral valve regurgitation.  9. The tricuspid valve is normal in structure. Tricuspid valve regurgitation is trivial. 10. The aortic valve is normal in structure. Aortic valve regurgitation was not visualized by color flow Doppler. 11. The pulmonic valve was grossly normal. Pulmonic valve regurgitation is not visualized by color flow Doppler. 12. Mildly elevated pulmonary artery systolic pressure. 13. The inferior vena cava is dilated in size with <50% respiratory variability, suggesting right atrial pressure of 15 mmHg.  _____________   History of Present Illness     [name] is a 78 y.o. female with past medical history of CAD s/p CABG 2004, s/p DES to LM [id number], chronic diastolic CHF, carotid artery disease, recurrent DVT, status post extensive bleeding complications on anticoagulation, s/p IVC filter, PAD s/p SMA stenting 2019 by [facility], hypertension, COPD and cirrhosis.  The patient was seen in the office on 10/04/2019 with acute on chronic diastolic heart failure.  She was noted to be short of breath with minimal activity (NYHA IIIb) and significant lower extremity swelling and rales on exam.  Her symptoms were similar to prior episodes when she was admitted to the hospital with CHF.  She had taken extra Lasix at home without improvement.  She had had a few episodes of angina over the past few weeks that were responsive to nitroglycerin.  Her EKG was unchanged.  The patient was  referred to [facility] for admission for IV diuresis and rule out myocardial ischemia.  Hospital Course     Consultants: none  BNP was 232.5.  High-sensitivity troponins were low and flat.  Chest xray  showed clear lungs. The patient was diuresed initially with Lasix 60 mg IV twice daily, switched to Lasix 80 mg p.o. daily. The patient has had a total diuresis of 5.5 L since admission.  Weight has come down from 177 pounds to 168 pounds. (166 lbs yesterday).  New dry weight seems to be 167-168 pounds.   1. CHF: Appears clinically euvolemic.  I asked her to continue daily weights when she gets home, call us if she gains more than 3 pounds in 1 night, 5 pounds in 1 week or if she ever exceeds 170 pounds.  Reviewed sodium restriction and daily weight monitoring, signs and symptoms of heart failure. 2. CAD s/p CABG and DE: No angina pectoris currently, although this was a complaint prior to admission and diuresis. 3. Hx DVT/PE/IVC filter: Not on anticoagulation due to recurrent bleeding events requiring transfusion. 4. COPD/chronic hypoxic respiratory failure; on chronic home O2 4 L/min (she is currently doing well on 3 L/min). 5. Hypothyroidism On levothyroxine 37.28mcg at home. Prior TSH have shown undertreated hypothyroidism. TSH this admission 6.419.  - Increase levo dose to 66mcg  Ready for discharge today.  We will make arrangements for an early return appointment for transition of care.  Bring log of daily weights to appointment.  Patient has been seen by Dr. Sallyanne Kuster today and deemed ready for discharge home. All follow up appointments have been scheduled. Discharge medications are listed below.   Did the patient have an acute coronary syndrome (MI, NSTEMI, STEMI, etc) this admission?:  No                               Did the patient have a percutaneous coronary intervention (stent / angioplasty)?:  No.   _____________  Discharge Vitals Blood pressure (!) 143/69, pulse 90, temperature 98 F (36.7 C), temperature source Oral, resp. rate 18, height 5\' 8"  (1.727 m), weight 76.4 kg, SpO2 (!) 89 %.  Filed Weights   10/06/19 0313 10/07/19 0446 10/08/19 0508  Weight: 77.7 kg 75.6 kg 76.4 kg     Labs & Radiologic Studies    CBC No results for input(s): WBC, NEUTROABS, HGB, HCT, MCV, PLT in the last 72 hours. Basic Metabolic Panel Recent Labs    10/07/19 0211 10/08/19 0341  NA 141 137  K 4.1 4.2  CL 96* 97*  CO2 33* 31  GLUCOSE 111* 97  BUN 15 18  CREATININE 1.14* 1.10*  CALCIUM 9.3 9.4   Liver Function Tests No results for input(s): AST, ALT, ALKPHOS, BILITOT, PROT, ALBUMIN in the last 72 hours. No results for input(s): LIPASE, AMYLASE in the last 72 hours. High Sensitivity Troponin:   Recent Labs  Lab 10/04/19 2021 10/04/19 2145  TROPONINIHS 10 11    BNP Invalid input(s): POCBNP D-Dimer No results for input(s): DDIMER in the last 72 hours. Hemoglobin A1C No results for input(s): HGBA1C in the last 72 hours. Fasting Lipid Panel No results for input(s): CHOL, HDL, LDLCALC, TRIG, CHOLHDL, LDLDIRECT in the last 72 hours. Thyroid Function Tests No results for input(s): TSH, T4TOTAL, T3FREE, THYROIDAB in the last 72 hours.  Invalid input(s): FREET3 _____________  Dg Chest 2 View  Result Date: 10/05/2019 CLINICAL DATA:  CHF  EXAM: CHEST - 2 VIEW COMPARISON:  03/08/2019 FINDINGS: Cardiomegaly status post median sternotomy. Aortic atherosclerosis. Both lungs are clear. Status post right shoulder reverse total arthroplasty. Disc degenerative disease of the thoracic spine. Partially imaged lumbar fusion. Calcified bilateral breast implants IMPRESSION: Cardiomegaly without acute abnormality of the lungs. Electronically Signed   By: Eddie Candle M.D.   On: 10/05/2019 12:08   Disposition   Pt is being discharged home today in good condition.  Follow-up Plans & Appointments    Follow-up Information    Liliane Shi, PA-C Follow up on 10/21/2019.   Specialties: Cardiology, Physician Assistant Why: Virtual visit @ 9:15am for hospital follow up VIDEO. Please see attached instrucitons for telehealth visit. You will be called prior to the visit to confirm  instsrucitons.  Contact information: [id number] [place] [address] [phone]          Discharge Instructions    (HEART FAILURE PATIENTS) Call MD:  Anytime you have any of the following symptoms: 1) 3 pound weight gain in 24 hours or 5 pounds in 1 week 2) shortness of breath, with or without a dry hacking cough 3) swelling in the hands, feet or stomach 4) if you have to sleep on extra pillows at night in order to breathe.   Complete by: As directed    Diet - low sodium heart healthy   Complete by: As directed    Increase activity slowly   Complete by: As directed       Discharge Medications   Allergies as of 10/08/2019      Reactions   Ativan [lorazepam] Other (See Comments)   Confusion    Pitavastatin Other (See Comments)   LIVALO: reaction not recalled   Ropinirole Nausea Only, Other (See Comments)   Makes legs jump, also   Zofran [ondansetron Hcl] Nausea Only   Zolpidem Tartrate Anxiety, Other (See Comments)   CONFUSION    Penicillins Rash, Other (See Comments)   Tolerated cephalosporins in past Has patient had a PCN reaction causing immediate rash, facial/tongue/throat swelling, SOB or lightheadedness with hypotension: Yes Has patient had a PCN reaction causing severe rash involving mucus membranes or skin necrosis: No Has patient had a PCN reaction that required hospitalization: No Has patient had a PCN reaction occurring within the last 10 years: No If all of the above answers are "NO", then may proceed with Cephalosporin use.      Medication List    TAKE these medications   allopurinol 100 MG tablet Commonly known as: ZYLOPRIM Take 100 mg by mouth daily.   aspirin EC 81 MG tablet Take 81 mg by mouth daily.   atorvastatin 20 MG tablet Commonly known as: LIPITOR Take 20 mg by mouth at bedtime.   BC HEADACHE POWDER PO Take 1 packet by mouth as needed (for headaches).   citalopram 40 MG tablet Commonly known as: CELEXA Take 40  mg by mouth at bedtime.   [employer] D3 25 MCG (1000 UT) capsule Generic drug: Cholecalciferol Take 1,000 Units by mouth daily.   cyclobenzaprine 10 MG tablet Commonly known as: FLEXERIL Take 10 mg by mouth 3 (three) times daily as needed for muscle spasms.   ferrous sulfate 325 (65 FE) MG tablet Take 325 mg by mouth daily with breakfast.   furosemide 80 MG tablet Commonly known as: LASIX Take 1 tablet (80 mg total) by mouth daily. Start taking on: October 09, 2019 What changed:   medication strength  how much  to take  how to take this  when to take this  additional instructions   guaiFENesin 600 MG 12 hr tablet Commonly known as: MUCINEX Take 600-1,200 mg by mouth 2 (two) times daily as needed for to loosen phlegm.   Incruse Ellipta 62.5 MCG/INH Aepb Generic drug: umeclidinium bromide Inhale 1 puff into the lungs 2 (two) times daily.   Ipratropium-Albuterol 20-100 MCG/ACT Aers respimat Commonly known as: COMBIVENT Inhale 1 puff into the lungs every 6 (six) hours as needed for wheezing or shortness of breath (or to help expel mucous).   levothyroxine 50 MCG tablet Commonly known as: SYNTHROID Take 1 tablet (50 mcg total) by mouth daily at 6 (six) AM. Start taking on: October 09, 2019 What changed:   medication strength  See the new instructions.   magnesium oxide 400 (241.3 Mg) MG tablet Commonly known as: MAG-OX Take 1 tablet (400 mg total) by mouth 2 (two) times daily.   Melatonin 10 MG Tabs Take 30 mg by mouth at bedtime.   neomycin-polymyxin-hydrocortisone 3.5-10000-1 OTIC suspension Commonly known as: CORTISPORIN Place 5 drops into the right ear every morning.   nitroGLYCERIN 0.4 MG SL tablet Commonly known as: NITROSTAT Place 1 tablet (0.4 mg total) under the tongue every 5 (five) minutes as needed for chest pain. For chest pain What changed: additional instructions   omeprazole 40 MG capsule Commonly known as: PRILOSEC Take 40 mg by mouth at  bedtime.   Oxycodone HCl 10 MG Tabs Take 10 mg by mouth every 6 (six) hours as needed (FOR PAIN).   oxyCODONE-acetaminophen 10-325 MG tablet Commonly known as: PERCOCET Take 1 tablet by mouth See admin instructions. Take 1 tablet by mouth in the morning, 1 tablet at 1 PM, 1 tablet at 5 PM, and 1 tablet at bedtime   OXYGEN Inhale 4 L/min into the lungs as needed (DURING ALL TIMES OF EXERTION).   pilocarpine 1 % ophthalmic solution Commonly known as: PILOCAR Place 1 drop into both eyes 2 (two) times daily.   potassium chloride 10 MEQ tablet Commonly known as: KLOR-CON Take 2 tablets (20 mEq total) by mouth daily.   pramipexole 0.25 MG tablet Commonly known as: MIRAPEX Take 0.25 mg by mouth at bedtime.   roflumilast 500 MCG Tabs tablet Commonly known as: DALIRESP Take 500 mcg by mouth every evening.   spironolactone 25 MG tablet Commonly known as: ALDACTONE Take 25 mg by mouth daily.   traZODone 50 MG tablet Commonly known as: DESYREL Take 150 mg by mouth at bedtime.          Outstanding Labs/Studies   None  Duration of Discharge Encounter   Greater than 30 minutes including physician time.  Signed, Daune Perch, NP 10/08/2019, 12:34 PM

## 2019-10-08 NOTE — Progress Notes (Signed)
Progress Note  Patient Name: [name] Date of Encounter: 10/08/2019  Primary Cardiologist: Dorris Carnes, MD   Subjective   Feels much better.  Still has to sleep with some head of bed elevation, but this has been the case all year. Fluid balance net neutral, total diuresis 5.5 L since admission. New "dry weight" seems to be 167-168 pounds.  Inpatient Medications    Scheduled Meds: . allopurinol  100 mg Oral Daily  . aspirin EC  81 mg Oral Daily  . atorvastatin  20 mg Oral q1800  . cholecalciferol  1,000 Units Oral Daily  . citalopram  40 mg Oral QHS  . enoxaparin (LOVENOX) injection  40 mg Subcutaneous Daily  . ferrous sulfate  325 mg Oral Q breakfast  . furosemide  80 mg Oral Daily  . levothyroxine  50 mcg Oral Q0600  . magnesium oxide  400 mg Oral BID  . Melatonin  3 mg Oral QHS  . neomycin-polymyxin-hydrocortisone  5 drop Right EAR BID  . pantoprazole  40 mg Oral Daily  . pilocarpine  1 drop Both Eyes BID  . pramipexole  0.25 mg Oral QHS  . roflumilast  500 mcg Oral QPM  . sodium chloride flush  3 mL Intravenous Q12H  . spironolactone  25 mg Oral Daily  . traZODone  50 mg Oral QHS  . umeclidinium bromide  1 puff Inhalation BID   Continuous Infusions: . sodium chloride     PRN Meds: sodium chloride, acetaminophen, cyclobenzaprine, HYDROcodone-acetaminophen, ipratropium-albuterol, nitroGLYCERIN, sodium chloride flush   Vital Signs    Vitals:   10/07/19 2031 10/08/19 0508 10/08/19 0837 10/08/19 0844  BP: (!) 151/59 (!) 148/62  (!) 143/69  Pulse: 87 75  90  Resp: 20 18    Temp: 98.7 F (37.1 C) 98.6 F (37 C)  98 F (36.7 C)  TempSrc: Oral Oral  Oral  SpO2: 96% 95% 95% (!) 89%  Weight:  76.4 kg    Height:        Intake/Output Summary (Last 24 hours) at 10/08/2019 1017 Last data filed at 10/08/2019 0500 Gross per 24 hour  Intake 480 ml  Output 1000 ml  Net -520 ml   Last 3 Weights 10/08/2019 10/07/2019 10/06/2019  Weight (lbs) 168 lb 6.9 oz  166 lb 11.2 oz 171 lb 6.4 oz  Weight (kg) 76.4 kg 75.615 kg 77.747 kg      Telemetry    Sinus rhythm with frequent PACs- Personally Reviewed  ECG    No new tracing- Personally Reviewed  Physical Exam  Appears well, smiling GEN: No acute distress.   Neck: No JVD Cardiac:  Regular baseline with frequent ectopy, no murmurs, rubs, or gallops.  Respiratory: Clear to auscultation bilaterally. GI: Soft, nontender, non-distended  MS: No edema; No deformity. Neuro:  Nonfocal  Psych: Normal affect   Labs    High Sensitivity Troponin:   Recent Labs  Lab 10/04/19 2021 10/04/19 2145  TROPONINIHS 10 11      Chemistry Recent Labs  Lab 10/04/19 2021  10/06/19 0324 10/07/19 0211 10/08/19 0341  NA 140   < > 141 141 137  K 3.1*   < > 4.2 4.1 4.2  CL 98   < > 98 96* 97*  CO2 31   < > 33* 33* 31  GLUCOSE 113*   < > 97 111* 97  BUN 10   < > 14 15 18   CREATININE 1.12*   < > 0.98 1.14* 1.10*  CALCIUM 8.9   < > 8.9 9.3 9.4  PROT 6.2*  --   --   --   --   ALBUMIN 3.0*  --   --   --   --   AST 15  --   --   --   --   ALT 10  --   --   --   --   ALKPHOS 75  --   --   --   --   BILITOT 0.5  --   --   --   --   GFRNONAA 47*   < > 55* 46* 48*  GFRAA 54*   < > >60 53* 55*  ANIONGAP 11   < > 10 12 9    < > = values in this interval not displayed.     Hematology Recent Labs  Lab 10/04/19 2021  WBC 10.1  RBC 3.88  HGB 11.6*  HCT 35.5*  MCV 91.5  MCH 29.9  MCHC 32.7  RDW 13.4  PLT 263    BNP Recent Labs  Lab 10/04/19 2021  BNP 232.5*     DDimer No results for input(s): DDIMER in the last 168 hours.   Radiology    No results found.  Cardiac Studies   10/05/19 TTE IMPRESSIONS 1. Left ventricular ejection fraction, by visual estimation, is 55 to 60%. The left ventricle has normal function. There is mildly increased left ventricular hypertrophy. 2. Elevated mean left atrial pressure. 3. Left ventricular diastolic Doppler parameters are consistent with  pseudonormalization pattern of LV diastolic filling. 4. Global right ventricle has normal systolic function.The right ventricular size is normal. No increase in right ventricular wall thickness. 5. Left atrial size was moderately dilated. 6. Right atrial size was moderately dilated. 7. Moderate mitral annular calcification. 8. The mitral valve is degenerative. Mild mitral valve regurgitation. 9. The tricuspid valve is normal in structure. Tricuspid valve regurgitation is trivial. 10. The aortic valve is normal in structure. Aortic valve regurgitation was not visualized by color flow Doppler. 11. The pulmonic valve was grossly normal. Pulmonic valve regurgitation is not visualized by color flow Doppler. 12. Mildly elevated pulmonary artery systolic pressure. 13. The inferior vena cava is dilated in size with <50% respiratory variability, suggesting right atrial pressure of 15 mmHg.   Patient Profile     78 y.o. female w/ PMH of HFpEF, CAD s/p CABG + DES, CAD, Recurrent DVT&PE s/p filter, hx of GI bleed, PAD, HTN, COPD and cirrhosis presenting from clinic w/ dyspnea 2/2 acute on chronic diastolic heart failure.  Assessment & Plan    1. CHF: Appears clinically euvolemic.  I asked her to continue daily weights when she gets home, call us if she gains more than 3 pounds in 1 night, 5 pounds in 1 week or if she ever exceeds 170 pounds.  Reviewed sodium restriction and daily weight monitoring, signs and symptoms of heart failure. 2. CAD s/p CABG and DE: No angina pectoris currently, although this was a complaint prior to admission and diuresis. 3. Hx DVT/PE/IVC filter: Not on anticoagulation due to recurrent bleeding events requiring transfusion. 4. COPD/chronic hypoxic respiratory failure; on chronic home O2 4 L/min (she is currently doing well on 3 L/min).  Ready for discharge today.  We will make arrangements for an early return appointment for transition of care.  Bring log of daily  weights to appointment.  For questions or updates, please contact [place] Please consult [email or web address] for contact info under  Signed, Sanda Klein, MD  10/08/2019, 10:17 AM

## 2019-10-11 ENCOUNTER — Other Ambulatory Visit: Payer: Self-pay

## 2019-10-11 NOTE — Patient Outreach (Signed)
[facility]) Care Management  [place]  10/11/2019   [name] [birth year] [id number]  Subjective: Telephone call to patient for transition of care call.  Patient reports she is doing better.  She states that her most recent weight was 168 lbs.  Discussed with patient weight parameters. Patient has follow up scheduled with heart doctor but not PCP.  Encouraged patient to call PCP for follow up appointment.  She states she will call.    Patient lives alone and has medical alert. Patient states her granddaughter is supportive and visits.  She states her son also helps.  Patient admits to CHF, COPD, HTN, GERD, and Hyperlipidemia.  Patient also with history of falls.  Discussed fall precautions.  Patient weighs daily and records and knows when to contact physician.  Patient reports some swelling but is better. Patient uses oxygen and has some shortness of breath regularly and relies on her oxygen regularly right now.  Patient able to bath and dress herself and get around her house, fix her something to eat and do light housekeeping.  Patient also with history of depression and is on Celexa per patient.  She denies any thoughts on harming herself.    Patient takes medications as prescribed and her son helps her manage her medications.  She is able to afford medications.    Objective:   Encounter Medications:  Outpatient Encounter Medications as of 10/11/2019  Medication Sig Note  . allopurinol (ZYLOPRIM) 100 MG tablet Take 100 mg by mouth daily.   Marland Kitchen aspirin EC 81 MG tablet Take 81 mg by mouth daily.   . Aspirin-Salicylamide-Caffeine (BC HEADACHE POWDER PO) Take 1 packet by mouth as needed (for headaches).   Marland Kitchen atorvastatin (LIPITOR) 20 MG tablet Take 20 mg by mouth at bedtime.    . citalopram (CELEXA) 40 MG tablet Take 40 mg by mouth at bedtime.   . [employer] D3 1000 units capsule Take 1,000 Units by mouth daily.   . cyclobenzaprine (FLEXERIL) 10 MG tablet Take 10 mg by mouth  3 (three) times daily as needed for muscle spasms.    . ferrous sulfate 325 (65 FE) MG tablet Take 325 mg by mouth daily with breakfast.   . furosemide (LASIX) 80 MG tablet Take 1 tablet (80 mg total) by mouth daily.   Marland Kitchen guaiFENesin (MUCINEX) 600 MG 12 hr tablet Take 600-1,200 mg by mouth 2 (two) times daily as needed for to loosen phlegm.    . INCRUSE ELLIPTA 62.5 MCG/INH AEPB Inhale 1 puff into the lungs 2 (two) times daily.    . Ipratropium-Albuterol (COMBIVENT) 20-100 MCG/ACT AERS respimat Inhale 1 puff into the lungs every 6 (six) hours as needed for wheezing or shortness of breath (or to help expel mucous).    Marland Kitchen levothyroxine (SYNTHROID) 50 MCG tablet Take 1 tablet (50 mcg total) by mouth daily at 6 (six) AM.   . magnesium oxide (MAG-OX) 400 (241.3 Mg) MG tablet Take 1 tablet (400 mg total) by mouth 2 (two) times daily.   . nitroGLYCERIN (NITROSTAT) 0.4 MG SL tablet Place 1 tablet (0.4 mg total) under the tongue every 5 (five) minutes as needed for chest pain. For chest pain (Patient taking differently: Place 0.4 mg under the tongue every 5 (five) minutes as needed for chest pain. )   . omeprazole (PRILOSEC) 40 MG capsule Take 40 mg by mouth at bedtime.    . Oxycodone HCl 10 MG TABS Take 10 mg by mouth every 6 (  six) hours as needed (FOR PAIN).    Marland Kitchen oxyCODONE-acetaminophen (PERCOCET) 10-325 MG tablet Take 1 tablet by mouth See admin instructions. Take 1 tablet by mouth in the morning, 1 tablet at 1 PM, 1 tablet at 5 PM, and 1 tablet at bedtime   . OXYGEN Inhale 4 L/min into the lungs as needed (DURING ALL TIMES OF EXERTION).    Marland Kitchen pilocarpine (PILOCAR) 1 % ophthalmic solution Place 1 drop into both eyes 2 (two) times daily.   . potassium chloride (K-DUR,KLOR-CON) 10 MEQ tablet Take 2 tablets (20 mEq total) by mouth daily.   . pramipexole (MIRAPEX) 0.25 MG tablet Take 0.25 mg by mouth at bedtime.   . roflumilast (DALIRESP) 500 MCG TABS tablet Take 500 mcg by mouth every evening.    Marland Kitchen  spironolactone (ALDACTONE) 25 MG tablet Take 25 mg by mouth daily.   . traZODone (DESYREL) 50 MG tablet Take 150 mg by mouth at bedtime.  10/04/2019: 3 tablets = 150 mg  . Melatonin 10 MG TABS Take 30 mg by mouth at bedtime. 10/04/2019: 3 tablets = 30 mg  . neomycin-polymyxin-hydrocortisone (CORTISPORIN) 3.5-10000-1 OTIC suspension Place 5 drops into the right ear every morning.  10/04/2019: "Stop date" not yet known   No facility-administered encounter medications on file as of 10/11/2019.     Functional Status:  In your present state of health, do you have any difficulty performing the following activities: 10/11/2019 10/05/2019  Hearing? N N  Vision? N N  Difficulty concentrating or making decisions? N N  Walking or climbing stairs? Y Y  Comment patient has shortness of breath -  Dressing or bathing? N N  Doing errands, shopping? Y N  Comment family does shopping -  Conservation officer, nature and eating ? N -  Using the Toilet? N -  In the past six months, have you accidently leaked urine? Y -  Do you have problems with loss of bowel control? N -  Managing your Medications? Y -  Comment son helps with medications -  Managing your Finances? N -  Housekeeping or managing your Housekeeping? Y -  Comment family helps -  Some recent data might be hidden    Fall/Depression Screening: Fall Risk  10/11/2019 05/13/2018 12/07/2017  Falls in the past year? 1 Yes No  Comment - - -  Number falls in past yr: 1 1 -  Injury with Fall? 1 No -  Risk for fall due to : History of fall(s);Impaired balance/gait History of fall(s) -  Follow up Falls prevention discussed Falls evaluation completed -   PHQ 2/9 Scores 10/11/2019 10/12/2018 05/13/2018 11/04/2017 05/26/2017 03/24/2017 03/23/2017  PHQ - 2 Score 2 0 6 3 2 3 3   PHQ- 9 Score 17 - 20 6 5 10 10     Assessment: Patient with recent hospitalization due to exacerbation of CHF.   Plan:  [facility] CM Care Plan Problem One     Most Recent Value  Role Documenting  the Problem One  Care Management Telephonic Coordinator  Care Plan for Problem One  Active  [facility] Long Term Goal   Patient will not readmit to hospital within 31 days.  [facility]N Long Term Goal Start Date  10/11/19  Interventions for Problem One Long Term Goal  RN CM discussed heart failure and signs of worsening.  Discussed the importacne of follow up with physicians.  THN CM Short Term Goal #1   Patient will report weighing and recording weights daily for the next 29ays.  THN  CM Short Term Goal #1 Start Date  10/11/19  Interventions for Short Term Goal #1  RN CM discussed importance of weighing and recording.  Discussed importance of monitoring for increased swelling and shortness of breath.  THN CM Short Term Goal #2   Patient will make follow up appointment with PCP within 14 days.  THN CM Short Term Goal #2 Start Date  10/11/19  Interventions for Short Term Goal #2  RN CM advised patient on importance of follow up with PCP and other physicians.  Patient to call PCP office to set up appointment.     RN CM will provide ongoing education and support to patient through phone calls.   RN CM will send welcome packet with consent to patient.   RN CM will send initial barriers letter, assessment, and care plan to primary care physician.   RN CM will contact patient next week and patient agrees to next contact.    [name], RN, MSN [place] Management Care Management Coordinator Direct Line [phone] Cell [phone] Toll Free: [phone]  Fax: [phone]

## 2019-10-17 DIAGNOSIS — I5033 Acute on chronic diastolic (congestive) heart failure: Secondary | ICD-10-CM | POA: Diagnosis not present

## 2019-10-18 ENCOUNTER — Other Ambulatory Visit: Payer: Self-pay

## 2019-10-18 NOTE — Patient Outreach (Signed)
[place] [facility]) Care Management  10/18/2019  [name] 09/16/1941 [id number]   Telephone call to patient for weekly check in. Patient reports that she is not doing good.  She reports seeing her PCP yesterday and patient edema concerned him.  Patient states that her weight is up to 172 lbs.  Patient is now taking lasix 80 mg in the am and  40 mg in the PM  in addition to metolazone 2.5 mg on Tuesday and Thursday. Patient states she is to see the cardiologist on Friday.  Advised patient to take weights and medications to appointment on Friday. She verbalized understanding.  Discussed with patient heart failure and diet.  She states she is closely following her diet and doing what the doctors are ordering.  Patient offers no questions or concerns on call but appreciative of follow up.    Plan: RN CM will contact patient next week and patient agreeable.    [name], RN, MSN [place] Management Care Management Coordinator Direct Line [phone] Cell [phone] Toll Free: [phone]  Fax: [phone]

## 2019-10-19 ENCOUNTER — Other Ambulatory Visit: Payer: Self-pay

## 2019-10-19 ENCOUNTER — Encounter: Payer: Self-pay | Admitting: Physician Assistant

## 2019-10-19 ENCOUNTER — Telehealth (INDEPENDENT_AMBULATORY_CARE_PROVIDER_SITE_OTHER): Payer: Medicare HMO | Admitting: Physician Assistant

## 2019-10-19 VITALS — Ht 68.0 in | Wt 165.0 lb

## 2019-10-19 DIAGNOSIS — I10 Essential (primary) hypertension: Secondary | ICD-10-CM

## 2019-10-19 DIAGNOSIS — I5032 Chronic diastolic (congestive) heart failure: Secondary | ICD-10-CM

## 2019-10-19 DIAGNOSIS — I25119 Atherosclerotic heart disease of native coronary artery with unspecified angina pectoris: Secondary | ICD-10-CM

## 2019-10-19 DIAGNOSIS — J449 Chronic obstructive pulmonary disease, unspecified: Secondary | ICD-10-CM

## 2019-10-19 NOTE — Patient Instructions (Addendum)
Medication Instructions:  No changes.  Continue current medications.  *If you need a refill on your cardiac medications before your next appointment, please call your pharmacy*  Lab Work: None  Have your primary care provider send [place] labs that are drawn at your next appointment ("BMET")  If you have labs (blood work) drawn today and your tests are completely normal, you will receive your results only by: Marland Kitchen MyChart Message (if you have MyChart) OR . A paper copy in the mail If you have any lab test that is abnormal or we need to change your treatment, we will call you to review the results.  Testing/Procedures: None   Follow-Up: At [facility], you and your health needs are our priority.  As part of our continuing mission to provide you with exceptional heart care, we have created designated Provider Care Teams.  These Care Teams include your primary Cardiologist (physician) and Advanced Practice Providers (APPs -  Physician Assistants and Nurse Practitioners) who all work together to provide you with the care you need, when you need it.  Your next appointment:   3-4 weeks  The format for your next appointment:   In Person  Provider:   You are scheduled to see [name] PA-C on 11/15/2019 @ 11:45 AM  Other Instructions Weigh daily Call if weight increases 3 or more lbs in 1 day or your leg swelling gets worse.

## 2019-10-19 NOTE — Progress Notes (Signed)
Virtual Visit via Telephone Note   This visit type was conducted due to national recommendations for restrictions regarding the COVID-19 Pandemic (e.g. social distancing) in an effort to limit this patient's exposure and mitigate transmission in our community.  Due to her co-morbid illnesses, this patient is at least at moderate risk for complications without adequate follow up.  This format is felt to be most appropriate for this patient at this time.  The patient did not have access to video technology/had technical difficulties with video requiring transitioning to audio format only (telephone).  All issues noted in this document were discussed and addressed.  No physical exam could be performed with this format.  The patient provided verbal consent today.  Date:  10/19/2019   ID:  [name], DOB 08-01-1941, MRN [id number]  Patient Location: Other:  [name] office parking lot Provider Location: Home  PCP:  [name], MD  Cardiologist:  Dorris Carnes, MD   Electrophysiologist:  None   Evaluation Performed:  Follow-Up Visit  Chief Complaint:  Hospital follow up, admitted with decompensated HF  History of Present Illness:    [name] is a 78 y.o. female with   Coronary artery disease  ? S/p CABG 2004 ? S/p DES to LM in 2010 ? Myoview 9/18: dist ant and apical ischemia; low risk  Chronic diastolic CHF ? Last admit in 12/2018  Carotid artery disease  Recurrent DVT/pulmonary embolism  ? anticoagulation DC'd in past due to GI bleed ? admx in 3/19 with multiple bilat PEs with L femoral DVT ? anticoagulation c/b large abd wall hemotoma >> s/p tx with PRBCs ? S/p IVC filter  PAD ? S/p SMA stenting in 2019 by [facility]  Hypertension   COPD  Cirrhosis  The patient was last seen in the office 10/04/2019.  She was admitted to the hospital for decompensated heart failure.  She was discharged 10/08/2019.  She diuresed 5.5 L and had a discharge weight of 168 pounds.   High-sensitivity troponin was negative.  Synthroid was adjusted for elevated TSH.  Today, she notes her weight is down and her swelling is improved.  However, her weight did increase to 177 lbs.  She saw her PCP and her Lasix was increased to 80 mg in the AM and 40 mg in the PM.  She is also on Metolazone twice a week now.  Her weight has decreased to 165 lbs today and her leg swelling has resolved.  She sleeps sitting up and has done so for a long time.  She has not had paroxysmal nocturnal dyspnea.  She has not had syncope but has been lightheaded at times.  She has chronic dyspnea on exertion.  This is with minimal activity.  She uses home O2.  There has been no change, overall, in her breathing.     Past Medical History:  Diagnosis Date   Anemia    bld. transfusion post lumbar surgery- 2012   Anxiety    Arthralgia    NOS   Blood transfusion 2012; 02/2018   WITH BACK SURGERY; "related to hematoma in stomach" (10/06/2018)   CAD (coronary artery disease)    s/p CABG 2004; s/p DES to LM in 2010;  [place] 10/29/11: EF 50-55%, mild elevated filling pressures, no pulmonary HTN, LM 90% ISR, LAD and CFX occluded, S-RI occluded (old), S-OM3 ok and L-LAD ok, native nondominant RCA 95% -  med rx recommended ; Lexiscan Myoview 7/13 at [facility]: demonstrated "normal LV  function, anterior attenuation and localized ischemia, inferior, basilar, mid section"   Carotid artery disease ([place])    Carotid US 0/35:  RICA 0-09; LICA 38-18; R subclavian stenosis - Repeat 1 year. // Carotid US 05/2019: R 1-39; L 40-59; R vertebral with atypical antegrade flow; R subclavian stenosis >> repeat 1 year   CHF (congestive heart failure) (HCC)    Chronic diastolic heart failure ([place])    Echo 9/10: EF 29-93%, grade 1 diastolic dysfunction   Chronic lower back pain    COPD (chronic obstructive pulmonary disease) ([place])    Emphysema dxed by Dr. Woody Seller in [place] based on PFTs per pt in 2006; placed on albuterol    Depression    TAKES CELEXA  AND  (OFF- WELBUTRIN)   Diuretic-induced hypokalemia 10/08/2018   DVT of lower extremity (deep venous thrombosis) (HCC)    recurrent. bilateral (2 episodes)   Dyspnea    Dysrhythmia    afib with cabg   Exertional angina (HCC)    Treated with Isosorbide, Ranexa, amlodipine; intolerant to metoprolol   GERD (gastroesophageal reflux disease)    Gout    "on daily RX" (10/06/2018)   HLD (hyperlipidemia)    Hypertension    Hyperthyroidism    Obesity (BMI 30-39.9) 2009   BMI 33   Osteoarthritis    "all over; hands" (10/06/2018)   Oxygen deficiency    Oxygen dependent    4L at home as needed (10/06/2018)   Pneumonia    PVD (peripheral vascular disease) ([place])    Sleep apnea 2012   USED CPAP THEN  STARTED USING SPIRIVA , Nov. 2013- last evaluation , changed from mask to aparatus that is just for her nose.. ; reports 05-28-18 "the mask smothers me so i dont use it right now" (10/06/2018)   Past Surgical History:  Procedure Laterality Date   ANKLE SURGERY  2004   ANTERIOR CERVICAL DECOMP/DISCECTOMY FUSION  11/2007   [place]   lower; another scheduled, opt. reports 4 back- lumbar, 3 cerv. fusions  for later 2009   CARDIAC CATHETERIZATION  11/2009   Patent LIMA to LAD and patent SVG to OM1. Occluded SVG to ramus and diagonal. Left main: 90% ostial stenosis, LCX 60-70% proximal stenosis   CATARACT EXTRACTION W/ INTRAOCULAR LENS  IMPLANT, BILATERAL Bilateral 2006   COLONOSCOPY WITH PROPOFOL N/A 02/17/2017   Procedure: COLONOSCOPY WITH PROPOFOL;  Surgeon: Jerene Bears, MD;  Location: [facility] ENDOSCOPY;  Service: Endoscopy;  Laterality: N/A;   CORONARY ANGIOPLASTY WITH STENT PLACEMENT  11/2009   Drug eluting stent to left main artery: 4.0 X 12 mm Ion    CORONARY ARTERY BYPASS GRAFT  2004   "CABG X4"   ESOPHAGOGASTRODUODENOSCOPY N/A 02/14/2017   Procedure: ESOPHAGOGASTRODUODENOSCOPY (EGD);  Surgeon: Doran Stabler, MD;  Location: [facility] ENDOSCOPY;  Service: Endoscopy;  Laterality: N/A;   GIVENS CAPSULE STUDY N/A 02/15/2017   Procedure: GIVENS CAPSULE STUDY;  Surgeon: Doran Stabler, MD;  Location: [place];  Service: Endoscopy;  Laterality: N/A;   GROIN MASS OPEN BIOPSY  2004   IR IVC FILTER PLMT / S&I /IMG GUID/MOD SED  03/14/2018   IR PARACENTESIS  03/10/2018   JOINT REPLACEMENT     LAPAROSCOPIC CHOLECYSTECTOMY     PERIPHERAL VASCULAR INTERVENTION Left 03/05/2018   Procedure: PERIPHERAL VASCULAR INTERVENTION;  Surgeon: Elam Dutch, MD;  Location: [place] CV LAB;  Service: Cardiovascular;  Laterality: Left;  Attempted unsuccess\ful Per  Dr. Eden Lathe   PERIPHERAL VASCULAR INTERVENTION  03/08/2018   Procedure: PERIPHERAL VASCULAR INTERVENTION;  Surgeon: Waynetta Sandy, MD;  Location: [place] CV LAB;  Service: Cardiovascular;;  SMA Stent    REPLACEMENT TOTAL KNEE BILATERAL Bilateral 2012   REVERSE SHOULDER ARTHROPLASTY  02/26/2012   Procedure: REVERSE SHOULDER ARTHROPLASTY;  Surgeon: Marin Shutter, MD;  Location: [place];  Service: Orthopedics;  Laterality: Right;  RIGHT SHOULDER REVERSED ARTHROPLASTY   SHOULDER ARTHROSCOPY WITH SUBACROMIAL DECOMPRESSION Left 02/10/2013   Procedure: SHOULDER ARTHROSCOPY WITH SUBACROMIAL DECOMPRESSION DISTAL CLAVICLE RESECTION;  Surgeon: Marin Shutter, MD;  Location: [place];  Service: Orthopedics;  Laterality: Left;  DISTAL CLAVICLE RESECTION   TONSILLECTOMY     TOTAL HIP ARTHROPLASTY  2007   Right   TRANSURETHRAL RESECTION OF BLADDER TUMOR N/A 05/31/2018   Procedure: TRANSURETHRAL RESECTION OF BLADDER TUMOR (TURBT);  Surgeon: Lucas Mallow, MD;  Location: [facility];  Service: Urology;  Laterality: N/A;   VISCERAL ANGIOGRAPHY N/A 03/05/2018   Procedure: VISCERAL ANGIOGRAPHY;  Surgeon: Elam Dutch, MD;  Location: [place] CV LAB;  Service: Cardiovascular;  Laterality: N/A;   VISCERAL ANGIOGRAPHY N/A 03/08/2018   Procedure:  VISCERAL ANGIOGRAPHY;  Surgeon: Waynetta Sandy, MD;  Location: [place] CV LAB;  Service: Cardiovascular;  Laterality: N/A;     Current Meds  Medication Sig   allopurinol (ZYLOPRIM) 100 MG tablet Take 100 mg by mouth daily.   aspirin EC 81 MG tablet Take 81 mg by mouth daily.   Aspirin-Salicylamide-Caffeine (BC HEADACHE POWDER PO) Take 1 packet by mouth as needed (for headaches).   atorvastatin (LIPITOR) 20 MG tablet Take 20 mg by mouth at bedtime.    citalopram (CELEXA) 40 MG tablet Take 40 mg by mouth at bedtime.   CVS D3 1000 units capsule Take 1,000 Units by mouth daily.   cyclobenzaprine (FLEXERIL) 10 MG tablet Take 10 mg by mouth 3 (three) times daily as needed for muscle spasms.    ferrous sulfate 325 (65 FE) MG tablet Take 325 mg by mouth daily with breakfast.   furosemide (LASIX) 40 MG tablet Take 40 mg by mouth daily in the afternoon. Daily in the afternoon   furosemide (LASIX) 80 MG tablet Take 1 tablet (80 mg total) by mouth daily.   guaiFENesin (MUCINEX) 600 MG 12 hr tablet Take 600-1,200 mg by mouth 2 (two) times daily as needed for to loosen phlegm.    INCRUSE ELLIPTA 62.5 MCG/INH AEPB Inhale 1 puff into the lungs 2 (two) times daily.    Ipratropium-Albuterol (COMBIVENT) 20-100 MCG/ACT AERS respimat Inhale 1 puff into the lungs every 6 (six) hours as needed for wheezing or shortness of breath (or to help expel mucous).    levothyroxine (SYNTHROID) 50 MCG tablet Take 1 tablet (50 mcg total) by mouth daily at 6 (six) AM.   magnesium oxide (MAG-OX) 400 (241.3 Mg) MG tablet Take 1 tablet (400 mg total) by mouth 2 (two) times daily.   Melatonin 10 MG TABS Take 30 mg by mouth at bedtime.   metolazone (ZAROXOLYN) 2.5 MG tablet Take 2.5 mg by mouth 2 (two) times a week. Take on Tuesday and Thursday   neomycin-polymyxin-hydrocortisone (CORTISPORIN) 3.5-10000-1 OTIC suspension Place 5 drops into the right ear every morning.    nitroGLYCERIN (NITROSTAT) 0.4  MG SL tablet Place 1 tablet (0.4 mg total) under the tongue every 5 (five) minutes as needed for chest pain. For chest pain (Patient taking differently: Place 0.4 mg under the  tongue every 5 (five) minutes as needed for chest pain. )   omeprazole (PRILOSEC) 40 MG capsule Take 40 mg by mouth at bedtime.    Oxycodone HCl 10 MG TABS Take 10 mg by mouth every 6 (six) hours as needed (FOR PAIN).    oxyCODONE-acetaminophen (PERCOCET) 10-325 MG tablet Take 1 tablet by mouth See admin instructions. Take 1 tablet by mouth in the morning, 1 tablet at 1 PM, 1 tablet at 5 PM, and 1 tablet at bedtime   OXYGEN Inhale 4 L/min into the lungs as needed (DURING ALL TIMES OF EXERTION).    pilocarpine (PILOCAR) 1 % ophthalmic solution Place 1 drop into both eyes 2 (two) times daily.   potassium chloride (K-DUR,KLOR-CON) 10 MEQ tablet Take 2 tablets (20 mEq total) by mouth daily.   pramipexole (MIRAPEX) 0.25 MG tablet Take 0.25 mg by mouth at bedtime.   roflumilast (DALIRESP) 500 MCG TABS tablet Take 500 mcg by mouth every evening.    spironolactone (ALDACTONE) 25 MG tablet Take 25 mg by mouth daily.   traZODone (DESYREL) 50 MG tablet Take 150 mg by mouth at bedtime.      Allergies:   Ativan [lorazepam], Pitavastatin, Ropinirole, Zofran [ondansetron hcl], Zolpidem tartrate, and Penicillins   Social History   Tobacco Use   Smoking status: Former Smoker    Packs/day: 1.00    Years: 45.00    Pack years: 45.00    Types: Cigarettes    Quit date: 12/22/2000    Years since quitting: 18.8   Smokeless tobacco: Never Used  Substance Use Topics   Alcohol use: Never    Alcohol/week: 0.0 standard drinks    Frequency: Never   Drug use: Never     Family Hx: The patient's family history includes COPD in her father; Cancer in her mother, sister, and another family member; Colitis in an other family member; Heart disease in her father, sister, and another family member; Lung cancer in her father; Ovarian  cancer in her mother.  ROS:   Please see the history of present illness.    No hematochezia, hematuria. All other systems reviewed and are negative.   Prior CV studies:   The following studies were reviewed today:  Echocardiogram 10/05/2019 EF 55-60, mild LVH, pseudonormalization (grade 2 diastolic dysfunction), moderate BAE, moderate MAC, mild MR, trivial TR, RVSP 34.5  Carotid US 06/06/2019 Summary: Right Carotid: Velocities in the right ICA are consistent with a 1-39% stenosis.                Non-hemodynamically significant plaque <50% noted in the CCA. The                RICA velocities remain within normal range and stable compared to                the previous exam.   Left Carotid: Velocities in the left ICA are consistent with a 40-59% stenosis,               based on peak systolic velocities and plaque formation.               Non-hemodynamically significant plaque noted in the CCA. The ECA               appears >50% stenosed. The LICA velocities are elevated and stable               compared to the previous exam.   Vertebrals:  Left vertebral artery demonstrates antegrade  flow. Small caliber              right vertebral artery with atypical, antegrade flow. Subclavians: Bilateral subclavian arteries were stenotic. Right subclavian              artery flow was disturbed.   Echocardiogram 11/09/2018 Mild concentric LVH, EF 60-65, normal wall motion, grade 1 diastolic dysfunction, severe MAC, trivial MR, mild BAE, trivial PI   Mesenteric vascular US 06/30/2018 Mesenteric: Normal Celiac artery , Splenic artery, Hepatic artery and Inferior Mesenteric artery findings. 70 to 99% stenosis in the superior mesenteric artery.   Mesenteric artery ultrasound 04/22/2018 FINAL INTERPRETATION: Mesenteric: 70 to 99% stenosis in the superior mesenteric artery, based on limited Visualization.   Echo 04/03/2018 EF 55-60, normal wall motion, MAC   Abdominal and chest CTA  04/01/2018 IMPRESSION: 1. No evidence of pulmonary embolism. Interval resolution of previously seen left lower lobe pulmonary emboli. 2. Unchanged moderate right and trace left pleural effusions with adjacent bibasilar atelectasis. 3. Relatively unchanged large anterior abdominal wall hematoma involving the space of Retzius, measuring 11.0 x 16.7 x 12.6 cm. 4.  Aortic atherosclerosis (ICD10-I70.0).   Echo 03/02/2018 EF 55-60, normal wall motion, trivial AI, MAC, RVSP 30   Echo 09/21/2017 Mild LVH, EF 53-61, grade 1 diastolic dysfunction, mild AI, moderate to severe MAC, mild MR, mild BAE   Labs/Other Tests and Data Reviewed:    EKG:  No ECG reviewed.  Recent Labs: 11/01/2018: NT-Pro BNP 601 10/04/2019: ALT 10; B Natriuretic Peptide 232.5; Hemoglobin 11.6; Platelets 263 10/05/2019: Magnesium 2.0; TSH 6.419 10/08/2019: BUN 18; Creatinine, Ser 1.10; Potassium 4.2; Sodium 137   Recent Lipid Panel Lab Results  Component Value Date/Time   CHOL 106 07/01/2018 08:00 AM   CHOL 98 05/23/2014 08:57 AM   TRIG 88 07/01/2018 08:00 AM   TRIG 83 05/23/2014 08:57 AM   HDL 38 (L) 07/01/2018 08:00 AM   HDL 35 (L) 05/23/2014 08:57 AM   CHOLHDL 2.8 07/01/2018 08:00 AM   CHOLHDL 4 03/03/2013 11:36 AM   LDLCALC 50 07/01/2018 08:00 AM   LDLCALC 46 05/23/2014 08:57 AM    Wt Readings from Last 3 Encounters:  10/19/19 165 lb (74.8 kg)  10/08/19 168 lb 6.9 oz (76.4 kg)  10/04/19 177 lb 3.2 oz (80.4 kg)     Objective:    Vital Signs:  Ht _0  (1.727 m)    Wt 165 lb (74.8 kg)    BMI 25.09 kg/m    VITAL SIGNS:  reviewed GEN:  no acute distress RESPIRATORY:  no labored breathing NEURO:  alert and oriented PSYCH:  normal mood  ASSESSMENT & PLAN:    1. Chronic diastolic heart failure (HCC) Echocardiogram 10/05/2019 with normal EF and mod diastolic dysfunction.  She did have some fluid gains post DC.  But, her PCP saw her and adjusted Lasix and added Metolazone twice a week.  It sounds like  she will have follow up lab work with her PCP soon. Her weight is down to baseline and her swelling is improved.  She has chronic dyspnea on exertion related to COPD that is unchanged.  Continue current Rx.  FU with PCP as planned.  I will arrange FU with Dr. Harrington Challenger or me in 3-4 weeks.  We discussed the importance of limiting salt.  2. Coronary artery disease involving native coronary artery of native heart with angina pectoris [facility]) She has a hx of CABG in 2004 and DES to LM in 2010.  Myoview in  2018 was low risk.  She has some chest pain prior to admission but her hs-Trop levels were normal. She has not had recurrent chest pain since.   Continue ASA, statin.   3. Essential hypertension She was not able to collect her BP today.  She actually showed up at our office thinking that she had an in person visit. Continue current Rx.   4. Chronic obstructive pulmonary disease, unspecified COPD type ([place]) She is on chronic O2.  She has chronic shortness of breath related to this that is largely unchanged.      Time:   Today, I have spent 13 minutes with the patient with telehealth technology discussing the above problems.     Medication Adjustments/Labs and Tests Ordered: Current medicines are reviewed at length with the patient today.  Concerns regarding medicines are outlined above.   Tests Ordered: No orders of the defined types were placed in this encounter.   Medication Changes: No orders of the defined types were placed in this encounter.   Follow Up:  In Person 3-4 weeks  Signed, [name], PA-C  10/19/2019 5:28 PM    Portal

## 2019-10-21 ENCOUNTER — Telehealth: Payer: Medicare HMO | Admitting: Physician Assistant

## 2019-10-21 DIAGNOSIS — F329 Major depressive disorder, single episode, unspecified: Secondary | ICD-10-CM | POA: Diagnosis not present

## 2019-10-21 DIAGNOSIS — I5033 Acute on chronic diastolic (congestive) heart failure: Secondary | ICD-10-CM | POA: Diagnosis not present

## 2019-10-21 DIAGNOSIS — D509 Iron deficiency anemia, unspecified: Secondary | ICD-10-CM | POA: Diagnosis not present

## 2019-10-21 DIAGNOSIS — I2510 Atherosclerotic heart disease of native coronary artery without angina pectoris: Secondary | ICD-10-CM | POA: Diagnosis not present

## 2019-10-21 DIAGNOSIS — J449 Chronic obstructive pulmonary disease, unspecified: Secondary | ICD-10-CM | POA: Diagnosis not present

## 2019-10-21 DIAGNOSIS — M109 Gout, unspecified: Secondary | ICD-10-CM | POA: Diagnosis not present

## 2019-10-21 DIAGNOSIS — F419 Anxiety disorder, unspecified: Secondary | ICD-10-CM | POA: Diagnosis not present

## 2019-10-21 DIAGNOSIS — E039 Hypothyroidism, unspecified: Secondary | ICD-10-CM | POA: Diagnosis not present

## 2019-10-21 DIAGNOSIS — I110 Hypertensive heart disease with heart failure: Secondary | ICD-10-CM | POA: Diagnosis not present

## 2019-10-24 DIAGNOSIS — F419 Anxiety disorder, unspecified: Secondary | ICD-10-CM | POA: Diagnosis not present

## 2019-10-24 DIAGNOSIS — J449 Chronic obstructive pulmonary disease, unspecified: Secondary | ICD-10-CM | POA: Diagnosis not present

## 2019-10-24 DIAGNOSIS — E039 Hypothyroidism, unspecified: Secondary | ICD-10-CM | POA: Diagnosis not present

## 2019-10-24 DIAGNOSIS — I2510 Atherosclerotic heart disease of native coronary artery without angina pectoris: Secondary | ICD-10-CM | POA: Diagnosis not present

## 2019-10-24 DIAGNOSIS — I5033 Acute on chronic diastolic (congestive) heart failure: Secondary | ICD-10-CM | POA: Diagnosis not present

## 2019-10-24 DIAGNOSIS — I110 Hypertensive heart disease with heart failure: Secondary | ICD-10-CM | POA: Diagnosis not present

## 2019-10-24 DIAGNOSIS — M109 Gout, unspecified: Secondary | ICD-10-CM | POA: Diagnosis not present

## 2019-10-24 DIAGNOSIS — F329 Major depressive disorder, single episode, unspecified: Secondary | ICD-10-CM | POA: Diagnosis not present

## 2019-10-24 DIAGNOSIS — D509 Iron deficiency anemia, unspecified: Secondary | ICD-10-CM | POA: Diagnosis not present

## 2019-10-25 ENCOUNTER — Other Ambulatory Visit: Payer: Self-pay

## 2019-10-25 NOTE — Patient Outreach (Signed)
[facility]) Care Management  10/25/2019  AERIEL MADRON Dec 12, 1941 [id number]   Telephone call to patient for final TOC call.  Patient reports she has had some problems with nausea and that home health nurse did call the doctor to notify but no new orders.  Patient reports that her weight is down to 162 lbs and she continues to take her medications as prescribed.  Discussed if she continues to have problems with nausea to reach out to the physician again.  She verbalized understanding. Patient continues to weigh regularly and record weights. Discussed diet and weight parameters. She verbalized understanding and declines any questions or needs.   Plan: RN CM will outreach patient again this month and patient agreeable.   [name], RN, MSN [place] Management Care Management Coordinator Direct Line [phone] Cell [phone] Toll Free: [phone]  Fax: [phone]

## 2019-10-27 DIAGNOSIS — D509 Iron deficiency anemia, unspecified: Secondary | ICD-10-CM | POA: Diagnosis not present

## 2019-10-27 DIAGNOSIS — E039 Hypothyroidism, unspecified: Secondary | ICD-10-CM | POA: Diagnosis not present

## 2019-10-27 DIAGNOSIS — J449 Chronic obstructive pulmonary disease, unspecified: Secondary | ICD-10-CM | POA: Diagnosis not present

## 2019-10-27 DIAGNOSIS — F419 Anxiety disorder, unspecified: Secondary | ICD-10-CM | POA: Diagnosis not present

## 2019-10-27 DIAGNOSIS — I2510 Atherosclerotic heart disease of native coronary artery without angina pectoris: Secondary | ICD-10-CM | POA: Diagnosis not present

## 2019-10-27 DIAGNOSIS — I110 Hypertensive heart disease with heart failure: Secondary | ICD-10-CM | POA: Diagnosis not present

## 2019-10-27 DIAGNOSIS — F329 Major depressive disorder, single episode, unspecified: Secondary | ICD-10-CM | POA: Diagnosis not present

## 2019-10-27 DIAGNOSIS — M109 Gout, unspecified: Secondary | ICD-10-CM | POA: Diagnosis not present

## 2019-10-27 DIAGNOSIS — I5033 Acute on chronic diastolic (congestive) heart failure: Secondary | ICD-10-CM | POA: Diagnosis not present

## 2019-11-03 DIAGNOSIS — I5032 Chronic diastolic (congestive) heart failure: Secondary | ICD-10-CM | POA: Diagnosis not present

## 2019-11-04 DIAGNOSIS — I110 Hypertensive heart disease with heart failure: Secondary | ICD-10-CM | POA: Diagnosis not present

## 2019-11-04 DIAGNOSIS — I5033 Acute on chronic diastolic (congestive) heart failure: Secondary | ICD-10-CM | POA: Diagnosis not present

## 2019-11-04 DIAGNOSIS — F329 Major depressive disorder, single episode, unspecified: Secondary | ICD-10-CM | POA: Diagnosis not present

## 2019-11-04 DIAGNOSIS — E039 Hypothyroidism, unspecified: Secondary | ICD-10-CM | POA: Diagnosis not present

## 2019-11-04 DIAGNOSIS — J449 Chronic obstructive pulmonary disease, unspecified: Secondary | ICD-10-CM | POA: Diagnosis not present

## 2019-11-04 DIAGNOSIS — F419 Anxiety disorder, unspecified: Secondary | ICD-10-CM | POA: Diagnosis not present

## 2019-11-04 DIAGNOSIS — I2510 Atherosclerotic heart disease of native coronary artery without angina pectoris: Secondary | ICD-10-CM | POA: Diagnosis not present

## 2019-11-04 DIAGNOSIS — M109 Gout, unspecified: Secondary | ICD-10-CM | POA: Diagnosis not present

## 2019-11-04 DIAGNOSIS — D509 Iron deficiency anemia, unspecified: Secondary | ICD-10-CM | POA: Diagnosis not present

## 2019-11-08 ENCOUNTER — Other Ambulatory Visit: Payer: Self-pay

## 2019-11-08 NOTE — Patient Outreach (Signed)
[place] [facility]) Care Management  11/08/2019  [name] 12-06-1941 [id number]   Telephone call to patient for follow up. Patient reports she is doing good.  She states that her nausea has subsided.  Home health has not been this week yet.  Patient continues to weigh and check blood pressure daily and record. Patient reports that her weight today was 164.4 lbs and blood pressure was 133/67.  Discussed with patient daily regimen and importance of daily weights. Patient denies any swelling or shortness of breath.  Discussed weight parameters and when to notify physician.  Patient denies any needs at this time.  Plan: RN CM will contact patient in the month of December and patient agreeable.    [name], RN, MSN [place] Management Care Management Coordinator Direct Line [phone] Cell [phone] Toll Free: [phone]  Fax: [phone]

## 2019-11-10 DIAGNOSIS — E039 Hypothyroidism, unspecified: Secondary | ICD-10-CM | POA: Diagnosis not present

## 2019-11-10 DIAGNOSIS — M109 Gout, unspecified: Secondary | ICD-10-CM | POA: Diagnosis not present

## 2019-11-10 DIAGNOSIS — D509 Iron deficiency anemia, unspecified: Secondary | ICD-10-CM | POA: Diagnosis not present

## 2019-11-10 DIAGNOSIS — I5033 Acute on chronic diastolic (congestive) heart failure: Secondary | ICD-10-CM | POA: Diagnosis not present

## 2019-11-10 DIAGNOSIS — I110 Hypertensive heart disease with heart failure: Secondary | ICD-10-CM | POA: Diagnosis not present

## 2019-11-10 DIAGNOSIS — F419 Anxiety disorder, unspecified: Secondary | ICD-10-CM | POA: Diagnosis not present

## 2019-11-10 DIAGNOSIS — I2510 Atherosclerotic heart disease of native coronary artery without angina pectoris: Secondary | ICD-10-CM | POA: Diagnosis not present

## 2019-11-10 DIAGNOSIS — F329 Major depressive disorder, single episode, unspecified: Secondary | ICD-10-CM | POA: Diagnosis not present

## 2019-11-10 DIAGNOSIS — J449 Chronic obstructive pulmonary disease, unspecified: Secondary | ICD-10-CM | POA: Diagnosis not present

## 2019-11-14 NOTE — Progress Notes (Deleted)
Cardiology Office Note:    Date:  11/14/2019   ID:  [name], DOB 1941-08-21, MRN [id number]  PCP:  [name], MD  Cardiologist:  Dorris Carnes, MD *** Electrophysiologist:  None   Referring MD: [name], MD   No chief complaint on file. ***  History of Present Illness:    [name] is a 78 y.o. female with:   Coronary artery disease  ? S/p CABG 2004 ? S/p DES to LM in 2010 ? Myoview 9/18: dist ant and apical ischemia; low risk  Chronic diastolic CHF ? Last admit in 09/2019  Carotid artery disease  Recurrent DVT/pulmonary embolism  ? anticoagulationDC'd in past due to GI bleed ? admx in 3/19 with multiple bilat PEs with L femoral DVT ? anticoagulationc/b large abd wall hemotoma >> s/p tx with PRBCs ? S/p IVC filter  PAD ? S/p SMA stenting in 2019 by [facility]  Hypertension   COPD  Cirrhosis  [name] was last seen in October 2020 via telemedicine for posthospitalization follow-up.  Her PCP had increased her furosemide and added metolazone prior to this visit for worsening volume status.   The DI[employer]ATELATER SmartLink is not supported in this context. ***   Prior CV studies:   The following studies were reviewed today:  *** Echocardiogram 10/05/2019 EF 55-60, mild LVH, pseudonormalization (grade 2 diastolic dysfunction), moderate BAE, moderate MAC, mild MR, trivial TR, RVSP 34.5  Carotid US 06/06/2019 Summary: Right Carotid: Velocities in the right ICA are consistent with a 1-39% stenosis. Non-hemodynamically significant plaque <50% noted in the CCA. The RICA velocities remain within normal range and stable compared to the previous exam.  Left Carotid: Velocities in the left ICA are consistent with a 40-59% stenosis, based on peak systolic velocities and plaque formation. Non-hemodynamically significant plaque noted in the CCA. The ECA appears >50%  stenosed. The LICA velocities are elevated and stable compared to the previous exam.  Vertebrals: Left vertebral artery demonstrates antegrade flow. Small caliber right vertebral artery with atypical, antegrade flow. Subclavians: Bilateral subclavian arteries were stenotic. Right subclavian artery flow was disturbed.  Echocardiogram 11/09/2018 Mild concentric LVH, EF 60-65, normal wall motion, grade 1 diastolic dysfunction, severe MAC, trivial MR, mild BAE, trivial PI  Mesenteric vascular US 06/30/2018 Mesenteric: Normal Celiac artery , Splenic artery, Hepatic artery and Inferior Mesenteric artery findings. 70 to 99% stenosis in the superior mesenteric artery.  Mesenteric artery ultrasound 04/22/2018 FINAL INTERPRETATION: Mesenteric: 70 to 99% stenosis in the superior mesenteric artery, based on limited Visualization.  Echo 04/03/2018 EF 55-60, normal wall motion, MAC  Abdominal and chest CTA 04/01/2018 IMPRESSION: 1. No evidence of pulmonary embolism. Interval resolution of previously seen left lower lobe pulmonary emboli. 2. Unchanged moderate right and trace left pleural effusions with adjacent bibasilar atelectasis. 3. Relatively unchanged large anterior abdominal wall hematoma involving the space of Retzius, measuring 11.0 x 16.7 x 12.6 cm. 4. Aortic atherosclerosis (ICD10-I70.0).  Echo 03/02/2018 EF 55-60, normal wall motion, trivial AI, MAC, RVSP 30  Echo 09/21/2017 Mild LVH, EF 33-00, grade 1 diastolic dysfunction, mild AI, moderate to severe MAC, mild MR, mild BAE  Past Medical History:  Diagnosis Date  . Anemia    bld. transfusion post lumbar surgery- 2012  . Anxiety   . Arthralgia    NOS  . Blood transfusion 2012; 02/2018   WITH BACK SURGERY; "related to hematoma in stomach" (10/06/2018)  . CAD (coronary artery disease)    s/p CABG 2004; s/p DES to  LM in 2010;  [place] 10/29/11: EF 50-55%, mild elevated filling  pressures, no pulmonary HTN, LM 90% ISR, LAD and CFX occluded, S-RI occluded (old), S-OM3 ok and L-LAD ok, native nondominant RCA 95% -  med rx recommended ; Lexiscan Myoview 7/13 at [facility]: demonstrated "normal LV function, anterior attenuation and localized ischemia, inferior, basilar, mid section"  . Carotid artery disease ([place])    Carotid US 0/30:  RICA 0-92; LICA 33-00; R subclavian stenosis - Repeat 1 year. // Carotid US 05/2019: R 1-39; L 40-59; R vertebral with atypical antegrade flow; R subclavian stenosis >> repeat 1 year  . CHF (congestive heart failure) ([place])   . Chronic diastolic heart failure (HCC)    Echo 9/10: EF 76-22%, grade 1 diastolic dysfunction  . Chronic lower back pain   . COPD (chronic obstructive pulmonary disease) ([place])    Emphysema dxed by Dr. Woody Seller in [place] based on PFTs per pt in 2006; placed on albuterol  . Depression    TAKES CELEXA  AND  (OFF- WELBUTRIN)  . Diuretic-induced hypokalemia 10/08/2018  . DVT of lower extremity (deep venous thrombosis) (HCC)    recurrent. bilateral (2 episodes)  . Dyspnea   . Dysrhythmia    afib with cabg  . Exertional angina (HCC)    Treated with Isosorbide, Ranexa, amlodipine; intolerant to metoprolol  . GERD (gastroesophageal reflux disease)   . Gout    "on daily RX" (10/06/2018)  . HLD (hyperlipidemia)   . Hypertension   . Hyperthyroidism   . Obesity (BMI 30-39.9) 2009   BMI 33  . Osteoarthritis    "all over; hands" (10/06/2018)  . Oxygen deficiency   . Oxygen dependent    4L at home as needed (10/06/2018)  . Pneumonia   . PVD (peripheral vascular disease) ([place])   . Sleep apnea 2012   USED CPAP THEN  STARTED USING SPIRIVA , Nov. 2013- last evaluation , changed from mask to aparatus that is just for her nose.. ; reports 05-28-18 "the mask smothers me so i dont use it right now" (10/06/2018)   Surgical Hx: The patient  has a past surgical history that includes Ankle surgery (2004); Groin mass open biopsy  (2004); Total hip arthroplasty (2007); Reverse shoulder arthroplasty (02/26/2012); Shoulder arthroscopy with subacromial decompression (Left, 02/10/2013); Esophagogastroduodenoscopy (N/A, 02/14/2017); Givens capsule study (N/A, 02/15/2017); Colonoscopy with propofol (N/A, 02/17/2017); VISCERAL ANGIOGRAPHY (N/A, 03/05/2018); PERIPHERAL VASCULAR INTERVENTION (Left, 03/05/2018); VISCERAL ANGIOGRAPHY (N/A, 03/08/2018); PERIPHERAL VASCULAR INTERVENTION (03/08/2018); IR Paracentesis (03/10/2018); IR IVC FILTER PLMT / S&I /IMG GUID/MOD SED (03/14/2018); Transurethral resection of bladder tumor (N/A, 05/31/2018); Laparoscopic cholecystectomy; Tonsillectomy; Augmentation mammaplasty; Replacement total knee bilateral (Bilateral, 2012); Anterior cervical decomp/discectomy fusion (11/2007); Back surgery (1986); Cataract extraction w/ intraocular lens  implant, bilateral (Bilateral, 2006); Joint replacement; Cardiac catheterization (11/2009); Coronary angioplasty with stent (11/2009); and Coronary artery bypass graft (2004).   Current Medications: No outpatient medications have been marked as taking for the 11/15/19 encounter (Appointment) with [name] T, PA-C.     Allergies:   Ativan [lorazepam], Pitavastatin, Ropinirole, Zofran [ondansetron hcl], Zolpidem tartrate, and Penicillins   Social History   Tobacco Use  . Smoking status: Former Smoker    Packs/day: 1.00    Years: 45.00    Pack years: 45.00    Types: Cigarettes    Quit date: 12/22/2000    Years since quitting: 18.9  . Smokeless tobacco: Never Used  Substance Use Topics  . Alcohol use: Never    Alcohol/week: 0.0 standard drinks  Frequency: Never  . Drug use: Never     Family Hx: The patient's family history includes COPD in her father; Cancer in her mother, sister, and another family member; Colitis in an other family member; Heart disease in her father, sister, and another family member; Lung cancer in her father; Ovarian cancer in her mother.   ROS:   Please see the history of present illness.    ROS All other systems reviewed and are negative.   EKGs/Labs/Other Test Reviewed:    EKG:  EKG is *** ordered today.  The ekg ordered today demonstrates ***  Recent Labs: 10/04/2019: ALT 10; B Natriuretic Peptide 232.5; Hemoglobin 11.6; Platelets 263 10/05/2019: Magnesium 2.0; TSH 6.419 10/08/2019: BUN 18; Creatinine, Ser 1.10; Potassium 4.2; Sodium 137   Recent Lipid Panel Lab Results  Component Value Date/Time   CHOL 106 07/01/2018 08:00 AM   CHOL 98 05/23/2014 08:57 AM   TRIG 88 07/01/2018 08:00 AM   TRIG 83 05/23/2014 08:57 AM   HDL 38 (L) 07/01/2018 08:00 AM   HDL 35 (L) 05/23/2014 08:57 AM   CHOLHDL 2.8 07/01/2018 08:00 AM   CHOLHDL 4 03/03/2013 11:36 AM   LDLCALC 50 07/01/2018 08:00 AM   LDLCALC 46 05/23/2014 08:57 AM    Physical Exam:    VS:  There were no vitals taken for this visit.    Wt Readings from Last 3 Encounters:  10/19/19 165 lb (74.8 kg)  10/08/19 168 lb 6.9 oz (76.4 kg)  10/04/19 177 lb 3.2 oz (80.4 kg)     ***Physical Exam  ASSESSMENT & PLAN:    *** 1. Chronic diastolic heart failure (HCC) Echocardiogram 10/05/2019 with normal EF and mod diastolic dysfunction.  She did have some fluid gains post DC.  But, her PCP saw her and adjusted Lasix and added Metolazone twice a week.  It sounds like she will have follow up lab work with her PCP soon. Her weight is down to baseline and her swelling is improved.  She has chronic dyspnea on exertion related to COPD that is unchanged.  Continue current Rx.  FU with PCP as planned.  I will arrange FU with Dr. Harrington Challenger or me in 3-4 weeks.  We discussed the importance of limiting salt.  2. Coronary artery disease involving native coronary artery of native heart with angina pectoris [facility]) She has a hx of CABG in 2004 and DES to LM in 2010. Myoview in 2018 was low risk.  She has some chest pain prior to admission but her hs-Trop levels were normal. She has not had  recurrent chest pain since.   Continue ASA, statin.   3. Essential hypertension She was not able to collect her BP today.  She actually showed up at our office thinking that she had an in person visit. Continue current Rx.   4. Chronic obstructive pulmonary disease, unspecified COPD type ([place]) She is on chronic O2.  She has chronic shortness of breath related to this that is largely unchanged.     Dispo:  No follow-ups on file.   Medication Adjustments/Labs and Tests Ordered: Current medicines are reviewed at length with the patient today.  Concerns regarding medicines are outlined above.  Tests Ordered: No orders of the defined types were placed in this encounter.  Medication Changes: No orders of the defined types were placed in this encounter.   Signed, [name], PA-C  11/14/2019 4:31 PM    [place], [place]  [address] Phone: [phone]; Fax: [phone]    

## 2019-11-15 ENCOUNTER — Ambulatory Visit: Payer: Medicare HMO | Admitting: Physician Assistant

## 2019-11-16 ENCOUNTER — Telehealth: Payer: Self-pay | Admitting: Internal Medicine

## 2019-11-16 DIAGNOSIS — I110 Hypertensive heart disease with heart failure: Secondary | ICD-10-CM | POA: Diagnosis not present

## 2019-11-16 DIAGNOSIS — E039 Hypothyroidism, unspecified: Secondary | ICD-10-CM | POA: Diagnosis not present

## 2019-11-16 DIAGNOSIS — F419 Anxiety disorder, unspecified: Secondary | ICD-10-CM | POA: Diagnosis not present

## 2019-11-16 DIAGNOSIS — M109 Gout, unspecified: Secondary | ICD-10-CM | POA: Diagnosis not present

## 2019-11-16 DIAGNOSIS — I2510 Atherosclerotic heart disease of native coronary artery without angina pectoris: Secondary | ICD-10-CM | POA: Diagnosis not present

## 2019-11-16 DIAGNOSIS — D509 Iron deficiency anemia, unspecified: Secondary | ICD-10-CM | POA: Diagnosis not present

## 2019-11-16 DIAGNOSIS — F329 Major depressive disorder, single episode, unspecified: Secondary | ICD-10-CM | POA: Diagnosis not present

## 2019-11-16 DIAGNOSIS — J449 Chronic obstructive pulmonary disease, unspecified: Secondary | ICD-10-CM | POA: Diagnosis not present

## 2019-11-16 DIAGNOSIS — I5033 Acute on chronic diastolic (congestive) heart failure: Secondary | ICD-10-CM | POA: Diagnosis not present

## 2019-11-16 NOTE — Telephone Encounter (Signed)
She missed her appt in the office earlier this week. Chart indicates she takes Metolazone 2.5 mg on Tues and Thurs. PLAN: 1. Take total dose of Metolazone 5 mg tomorrow (Thurs) 30 min before Furosemide. 2. Take extra K+ 20 mEq with this dose (total of 40 mEq for the day) 3. Monitor weight over next 2-3 days. 4. If no improvement, call. 5. If weight goes up more, breathing worsens, swelling worsens >> Go to ED. 6. She needs in person follow up in the office with Dorris Carnes, MD or me/APP on team in next 1 week. [name], PA-C    11/16/2019 4:35 PM

## 2019-11-16 NOTE — Telephone Encounter (Signed)
Called patient about [name] recommendations. Patient is going to take Metolazone 5 mg [ (2) 2.5 mg tablets ] and take an extra potassium 20 meq tomorrow only. Patient is going to monitor her weight over the next 3 days and give our office a call if there is no improvement. Informed patient that if her weight goes up more, breathing gets worse, or swelling gets worse to go to the ED. Made patient the first available appointment with Truitt Merle, NP on Tuesday of next week. Patient agreed to plan.

## 2019-11-16 NOTE — Telephone Encounter (Signed)
Home health nurse calling about patient gaining 6 lbs in one week, SOB with activity, BLE edema, and crackles bilateral lower lungs. Vital signs- BP 138/62, HR 80, Resp 22 with activity/ 19 at rest, O2 97% on roomair and 99% with [place] 3L. Patient is taking Lasix 80 mg daily, aldactone 25 mg daily, and zaroxolyn 2.5 twice weekly. Will forward to [name] PA for advisement, that patient saw on virtual visit and will be seeing next month.

## 2019-11-16 NOTE — Telephone Encounter (Signed)
New message   Pt c/o swelling: STAT is pt has developed SOB within 24 hours  1) How much weight have you gained and in what time span? 6lb weight gain in 1 week  2) If swelling, where is the swelling located? Both feet and ankles   3) Are you currently taking a fluid pill? Yes   4) Are you currently SOB?yes  5) Do you have a log of your daily weights (if so, list)? Yes   6) Have you gained 3 pounds in a day or 5 pounds in a week?yes   7) Have you traveled recently? No    Please call Sharrie Rothman at [facility].

## 2019-11-20 NOTE — Progress Notes (Signed)
Cardiology Office Note:    Date:  11/22/2019   ID:  [name], DOB [birth year], MRN [id number]  PCP:  [name], MD  Cardiologist:  Dorris Carnes, MD  Electrophysiologist:  None   Referring MD: [name], [name], MD   Chief Complaint: follow-up of chronic diastolic CHF  History of Present Illness:    [name] is a 78 y.o. female with a history of CAD s/p CABG in 2004 with subsequent DES to left main in [id number], chronic diastolic CHF, carotid artery disease, PAD s/p SMA stenting in 2019, recurrent DVT/PE s/p IVC filter in 02/2018 not on anticoagulation due to GI bleed and prior large abdominal wall hematoma, hypertension, COPD on home O2, and cirrhosis who is followed by Dr. Harrington Challenger and presents today for follow-up of CHF.  Patient admitted on 10/04/2019 from the office for acute on chronic diastolic CHF. She was diuresed well with IV Lasix and was discharged on 10/08/2019. She diuresed 5.5 L and discharge weight was 168 lbs. Home Synthroid dose was increased during admission due to elevated TSH. Following discharge, she was seen by her PCP and noted to have an increase in her weight to 177 lbs. PCP increased Lasix to 80mg  in the morning and 40mg  in the eveneing and also started her on Metolazone 2.5mg  two times per week. She was seen by [name], PA-C, on 10/19/2019 for a telehealth visit. Her weight was down to 165 lbs and she reported improvement in her lower extremity edema. Breathing was stable. No medications changes were made at that time and she was advised to follow-up in 3-4 weeks. Home health RN called on 11/252020 reporting 6 lb weight gain in 1 week, increased dyspnea with activity, bilateral lower extremity edema, and bibasilar crackles on exam. Patient was advised to take increased dose of Metolazone 5mg  the next day and monitor weight and edema. Today's appointment was also scheduled for close follow-up.  Patient here alone. Patient doing better after increased dose of  Metolazone. Weight is back down to baseline of 154 lbs on home scales and low extremity edema has improved. Still has significant dyspnea on exertion with minimal activity but she states this is not new and has improved from last week. She states she mostly sleeps in a recline now and has some PND but states this is stable since discharge in 09/2019. Patient states she watches her sodium intake but states she thinks she was drinking too much fluid last week which caused the weight gain and edema. No chest pain. No palpitations but she does note some lightheadedness/dizziness with standing or with quick position changes that resolves quickly. She ambulates with a walker at home and denies any falls or near syncope/syncope. No abnormal bleeding. Patient's biggest complaint today is her depression. She states she has been very depressed lately and that this year as been very hard. She started to become tearful when discussing this. No suicidal ideation. She has a good support system and she is scheduled to see her PCP in a couple of weeks.  Past Medical History:  Diagnosis Date  . Anemia    bld. transfusion post lumbar surgery- 2012  . Anxiety   . Arthralgia    NOS  . Blood transfusion 2012; 02/2018   WITH BACK SURGERY; "related to hematoma in stomach" (10/06/2018)  . CAD (coronary artery disease)    s/p CABG 2004; s/p DES to LM in 2010;  [place] 10/29/11: EF 50-55%, mild elevated filling pressures, no pulmonary  HTN, LM 90% ISR, LAD and CFX occluded, S-RI occluded (old), S-OM3 ok and L-LAD ok, native nondominant RCA 95% -  med rx recommended ; Lexiscan Myoview 7/13 at [facility]: demonstrated "normal LV function, anterior attenuation and localized ischemia, inferior, basilar, mid section"  . Carotid artery disease ([place])    Carotid US [id number]:  RICA [id number]; LICA [id number]; R subclavian stenosis - Repeat 1 year. // Carotid US 05/2019: R 1-39; L 40-59; R vertebral with atypical antegrade flow; R subclavian stenosis >>  repeat 1 year  . CHF (congestive heart failure) ([place])   . Chronic diastolic heart failure (HCC)    Echo 9/10: EF [id number], grade 1 diastolic dysfunction  . Chronic lower back pain   . COPD (chronic obstructive pulmonary disease) ([place])    Emphysema dxed by Dr. Woody Seller in [place] based on PFTs per pt in 2006; placed on albuterol  . Depression    TAKES CELEXA  AND  (OFF- WELBUTRIN)  . Diuretic-induced hypokalemia 10/08/2018  . DVT of lower extremity (deep venous thrombosis) (HCC)    recurrent. bilateral (2 episodes)  . Dyspnea   . Dysrhythmia    afib with cabg  . Exertional angina (HCC)    Treated with Isosorbide, Ranexa, amlodipine; intolerant to metoprolol  . GERD (gastroesophageal reflux disease)   . Gout    "on daily RX" (10/06/2018)  . HLD (hyperlipidemia)   . Hypertension   . Hyperthyroidism   . Obesity (BMI 30-39.9) 2009   BMI 33  . Osteoarthritis    "all over; hands" (10/06/2018)  . Oxygen deficiency   . Oxygen dependent    4L at home as needed (10/06/2018)  . Pneumonia   . PVD (peripheral vascular disease) ([place])   . Sleep apnea 2012   USED CPAP THEN  STARTED USING SPIRIVA , Nov. 2013- last evaluation , changed from mask to aparatus that is just for her nose.. ; reports 05-28-18 "the mask smothers me so i dont use it right now" (10/06/2018)    Past Surgical History:  Procedure Laterality Date  . ANKLE SURGERY  2004  . ANTERIOR CERVICAL DECOMP/DISCECTOMY FUSION  11/2007  . AUGMENTATION MAMMAPLASTY    . [place]   lower; another scheduled, opt. reports 4 back- lumbar, 3 cerv. fusions  for later 2009  . CARDIAC CATHETERIZATION  11/2009   Patent LIMA to LAD and patent SVG to OM1. Occluded SVG to ramus and diagonal. Left main: 90% ostial stenosis, LCX 60-70% proximal stenosis  . CATARACT EXTRACTION W/ INTRAOCULAR LENS  IMPLANT, BILATERAL Bilateral 2006  . COLONOSCOPY WITH PROPOFOL N/A 02/17/2017   Procedure: COLONOSCOPY WITH PROPOFOL;  Surgeon: Jerene Bears, MD;   Location: [facility] ENDOSCOPY;  Service: Endoscopy;  Laterality: N/A;  . CORONARY ANGIOPLASTY WITH STENT PLACEMENT  11/2009   Drug eluting stent to left main artery: 4.0 X 12 mm Ion   . CORONARY ARTERY BYPASS GRAFT  2004   "CABG X4"  . ESOPHAGOGASTRODUODENOSCOPY N/A 02/14/2017   Procedure: ESOPHAGOGASTRODUODENOSCOPY (EGD);  Surgeon: Doran Stabler, MD;  Location[facility] ENDOSCOPY;  Service: Endoscopy;  Laterality: N/A;  . GIVENS CAPSULE STUDY N/A 02/15/2017   Procedure: GIVENS CAPSULE STUDY;  Surgeon: Doran Stabler, MD;  Location: [place];  Service: Endoscopy;  Laterality: N/A;  . GROIN MASS OPEN BIOPSY  2004  . IR IVC FILTER PLMT / S&I /IMG GUID/MOD SED  03/14/2018  . IR PARACENTESIS  03/10/2018  . JOINT REPLACEMENT    . LAPAROSCOPIC CHOLECYSTECTOMY    .  PERIPHERAL VASCULAR INTERVENTION Left 03/05/2018   Procedure: PERIPHERAL VASCULAR INTERVENTION;  Surgeon: Elam Dutch, MD;  Location: [place] CV LAB;  Service: Cardiovascular;  Laterality: Left;  Attempted unsuccess\ful Per Dr. Eden Lathe  . PERIPHERAL VASCULAR INTERVENTION  03/08/2018   Procedure: PERIPHERAL VASCULAR INTERVENTION;  Surgeon: Waynetta Sandy, MD;  Location: [place] [facility];  Service: Cardiovascular;;  SMA Stent   . REPLACEMENT TOTAL KNEE BILATERAL Bilateral 2012  . REVERSE SHOULDER ARTHROPLASTY  02/26/2012   Procedure: REVERSE SHOULDER ARTHROPLASTY;  Surgeon: Marin Shutter, MD;  Location: [place];  Service: Orthopedics;  Laterality: Right;  RIGHT SHOULDER REVERSED ARTHROPLASTY  . SHOULDER ARTHROSCOPY WITH SUBACROMIAL DECOMPRESSION Left 02/10/2013   Procedure: SHOULDER ARTHROSCOPY WITH SUBACROMIAL DECOMPRESSION DISTAL CLAVICLE RESECTION;  Surgeon: Marin Shutter, MD;  Location: [place];  Service: Orthopedics;  Laterality: Left;  DISTAL CLAVICLE RESECTION  . TONSILLECTOMY    . TOTAL HIP ARTHROPLASTY  2007   Right  . TRANSURETHRAL RESECTION OF BLADDER TUMOR N/A 05/31/2018   Procedure: TRANSURETHRAL RESECTION OF BLADDER  TUMOR (TURBT);  Surgeon: Lucas Mallow, MD;  Location: [facility];  Service: Urology;  Laterality: N/A;  . VISCERAL ANGIOGRAPHY N/A 03/05/2018   Procedure: VISCERAL ANGIOGRAPHY;  Surgeon: Elam Dutch, MD;  Location: [employer] [facility];  Service: Cardiovascular;  Laterality: N/A;  . VISCERAL ANGIOGRAPHY N/A 03/08/2018   Procedure: VISCERAL ANGIOGRAPHY;  Surgeon: Waynetta Sandy, MD;  Location: [place] [facility];  Service: Cardiovascular;  Laterality: N/A;    Current Medications: Current Meds  Medication Sig  . allopurinol (ZYLOPRIM) 100 MG tablet Take 100 mg by mouth daily.  Marland Kitchen aspirin EC 81 MG tablet Take 81 mg by mouth daily.  . Aspirin-Salicylamide-Caffeine (BC HEADACHE POWDER PO) Take 1 packet by mouth as needed (for headaches).  Marland Kitchen atorvastatin (LIPITOR) 10 MG tablet Take 1 tablet by mouth daily.  . citalopram (CELEXA) 40 MG tablet Take 40 mg by mouth at bedtime.  . CVS D3 1000 units capsule Take 1,000 Units by mouth daily.  . cyclobenzaprine (FLEXERIL) 10 MG tablet Take 10 mg by mouth 3 (three) times daily as needed for muscle spasms.   . ferrous sulfate 325 (65 FE) MG tablet Take 325 mg by mouth daily with breakfast.  . furosemide (LASIX) 40 MG tablet Take 40 mg by mouth daily in the afternoon. Daily in the afternoon  . furosemide (LASIX) 80 MG tablet Take 1 tablet (80 mg total) by mouth daily.  Marland Kitchen guaiFENesin (MUCINEX) 600 MG 12 hr tablet Take 600-1,200 mg by mouth 2 (two) times daily as needed for to loosen phlegm.   . INCRUSE ELLIPTA 62.5 MCG/INH AEPB Inhale 1 puff into the lungs 2 (two) times daily.   . Ipratropium-Albuterol (COMBIVENT) 20-100 MCG/ACT AERS respimat Inhale 1 puff into the lungs every 6 (six) hours as needed for wheezing or shortness of breath (or to help expel mucous).   Marland Kitchen levothyroxine (SYNTHROID) 50 MCG tablet Take 1 tablet (50 mcg total) by mouth daily at 6 (six) AM.  . magnesium oxide (MAG-OX) 400 (241.3 Mg) MG tablet Take 1 tablet (400 mg total) by  mouth 2 (two) times daily.  . Melatonin 10 MG TABS Take 30 mg by mouth at bedtime.  . metolazone (ZAROXOLYN) 2.5 MG tablet Take 2.5 mg by mouth 2 (two) times a week. Take on Tuesday and Thursday  . neomycin-polymyxin-hydrocortisone (CORTISPORIN) 3.5-10000-1 OTIC suspension Place 5 drops into the right ear every morning.   . nitroGLYCERIN (NITROSTAT) 0.4  MG SL tablet Place 1 tablet (0.4 mg total) under the tongue every 5 (five) minutes as needed for chest pain. For chest pain (Patient taking differently: Place 0.4 mg under the tongue every 5 (five) minutes as needed for chest pain. )  . omeprazole (PRILOSEC) 40 MG capsule Take 40 mg by mouth at bedtime.   . Oxycodone HCl 10 MG TABS Take 10 mg by mouth every 6 (six) hours as needed (FOR PAIN).   Marland Kitchen oxyCODONE-acetaminophen (PERCOCET) 10-325 MG tablet Take 1 tablet by mouth See admin instructions. Take 1 tablet by mouth in the morning, 1 tablet at 1 PM, 1 tablet at 5 PM, and 1 tablet at bedtime  . OXYGEN Inhale 4 L/min into the lungs as needed (DURING ALL TIMES OF EXERTION).   Marland Kitchen pilocarpine (PILOCAR) 1 % ophthalmic solution Place 1 drop into both eyes 2 (two) times daily.  . potassium chloride (K-DUR,KLOR-CON) 10 MEQ tablet Take 2 tablets (20 mEq total) by mouth daily.  . pramipexole (MIRAPEX) 0.25 MG tablet Take 0.25 mg by mouth at bedtime.  . roflumilast (DALIRESP) 500 MCG TABS tablet Take 500 mcg by mouth every evening.   Marland Kitchen spironolactone (ALDACTONE) 25 MG tablet Take 25 mg by mouth daily.  . traZODone (DESYREL) 50 MG tablet Take 150 mg by mouth at bedtime.      Allergies:   Ativan [lorazepam], Pitavastatin, Ropinirole, Zofran [ondansetron hcl], Zolpidem tartrate, and Penicillins   Social History   Socioeconomic History  . Marital status: Widowed    Spouse name: Not on file  . Number of children: 3  . Years of education: Not on file  . Highest education level: Not on file  Occupational History  . Occupation: retired    Comment: Stage manager: RETIRED  Social Needs  . Financial resource strain: Not on file  . Food insecurity    Worry: Never true    Inability: Never true  . Transportation needs    Medical: No    Non-medical: No  Tobacco Use  . Smoking status: Former Smoker    Packs/day: 1.00    Years: 45.00    Pack years: 45.00    Types: Cigarettes    Quit date: 12/22/2000    Years since quitting: 18.9  . Smokeless tobacco: Never Used  Substance and Sexual Activity  . Alcohol use: Never    Alcohol/week: 0.0 standard drinks    Frequency: Never  . Drug use: Never  . Sexual activity: Not Currently  Lifestyle  . Physical activity    Days per week: Not on file    Minutes per session: Not on file  . Stress: Not on file  Relationships  . Social Herbalist on phone: Not on file    Gets together: Not on file    Attends religious service: Not on file    Active member of club or organization: Not on file    Attends meetings of clubs or organizations: Not on file    Relationship status: Not on file  Other Topics Concern  . Not on file  Social History Narrative      3 grown children.     Family History: The patient's family history includes COPD in her father; Cancer in her mother, sister, and another family member; Colitis in an other family member; Heart disease in her father, sister, and another family member; Lung cancer in her father; Ovarian cancer in her mother.  ROS:   Please  see the history of present illness.    All other systems reviewed and are negative.  EKGs/Labs/Other Studies Reviewed:    The following studies were reviewed today:  Echocardiogram 10/05/2019: Impressions:  1. Left ventricular ejection fraction, by visual estimation, is 55 to 60%. The left ventricle has normal function. There is mildly increased left ventricular hypertrophy.  2. Elevated mean left atrial pressure.  3. Left ventricular diastolic Doppler parameters are consistent with pseudonormalization  pattern of LV diastolic filling.  4. Global right ventricle has normal systolic function.The right ventricular size is normal. No increase in right ventricular wall thickness.  5. Left atrial size was moderately dilated.  6. Right atrial size was moderately dilated.  7. Moderate mitral annular calcification.  8. The mitral valve is degenerative. Mild mitral valve regurgitation.  9. The tricuspid valve is normal in structure. Tricuspid valve regurgitation is trivial. 10. The aortic valve is normal in structure. Aortic valve regurgitation was not visualized by color flow Doppler. 11. The pulmonic valve was grossly normal. Pulmonic valve regurgitation is not visualized by color flow Doppler. 12. Mildly elevated pulmonary artery systolic pressure. 13. The inferior vena cava is dilated in size with <50% respiratory variability, suggesting right atrial pressure of 15 mmHg.  EKG:  EKG not ordered today.  Recent Labs: 10/04/2019: ALT 10; B Natriuretic Peptide 232.5; Hemoglobin 11.6; Platelets 263 10/05/2019: Magnesium 2.0; TSH 6.419 10/08/2019: BUN 18; Creatinine, Ser 1.10; Potassium 4.2; Sodium 137  Recent Lipid Panel    Component Value Date/Time   CHOL 106 07/01/2018 0800   CHOL 98 05/23/2014 0857   TRIG 88 07/01/2018 0800   TRIG 83 05/23/2014 0857   HDL 38 (L) 07/01/2018 0800   HDL 35 (L) 05/23/2014 0857   CHOLHDL 2.8 07/01/2018 0800   CHOLHDL 4 03/03/2013 1136   VLDL 24.4 03/03/2013 1136   LDLCALC 50 07/01/2018 0800   LDLCALC 46 05/23/2014 0857    Physical Exam:    Vital Signs: BP (!) 102/58   Pulse 84   Ht 5\' 8"  (1.727 m)   Wt 158 lb 6.4 oz (71.8 kg)   SpO2 91%   BMI 24.08 kg/m     Wt Readings from Last 3 Encounters:  11/22/19 158 lb 6.4 oz (71.8 kg)  10/19/19 165 lb (74.8 kg)  10/08/19 168 lb 6.9 oz (76.4 kg)     General: 78 y.o. female in no acute distress. HEENT: Normocephalic and atraumatic. Sclera clear. EOMs intact. Neck: Supple. No JVD. Heart: RRR. Distinct S1  and S2. II/VI systolic murmur best heard at right upper sternal border. No gallops or rubs. Radial and distal pedal pulses 2+ and equal bilaterally. Lungs: No increased work of breathing. Clear to ausculation bilaterally. No significant wheezes, rhonchi, or rales appreciated. Abdomen: Soft, non-distended, and non-tender to palpation. Bowel sounds present. MSK: Generalized weakness. Ambulates with a walker. Extremities: No lower extremity edema.    Skin: Warm and dry. Neuro: Alert and oriented x3. No focal deficits. Psych: Normal affect. Responds appropriately.  Assessment:    1. Chronic diastolic heart failure ([place])   2. Coronary artery disease involving native coronary artery of native heart with angina pectoris ([place])   3. Bilateral carotid artery disease, unspecified type ([place])   4. Essential hypertension   5. Hyperlipidemia, unspecified hyperlipidemia type   6. Chronic obstructive pulmonary disease, unspecified COPD type ([place])   7. Depression, unspecified depression type     Plan:    Chronic Diastolic CHF - Home health RN called our office  last week reporting 6 lb weight gain and worsening edema and dyspnea with activity. Sound like this was due to patient drinking too much fluid. Patient advised to take one dose of Metolazone 5mg  daily with improvement. - Recent Echo in 09/2019 showed LVEF of 55-60% with mild LVH, grade 2 diastolic dysfunction, and elevated mean left atrial pressure. - Patient appears euvolemic on exam. Weight back down to baseline. No lower extremity edema and no significant crackles on exam. - Continue current medications: Lasix 80mg  in the morning and 40mg  in the evening, Spironolactone 25mg  daily, and Metolazone 2.5mg  two times per week. Continue K-Dur 20 mEq daily.  - Patient advised to continue monitoring daily weights and continue fluid/sodium restrictions. - Will recheck BMET today to ensure renal function and potassium are stable.  CAD - History of CABG in  2004 and DES to left main in 2010. Myoview in 2018 was low risk.  - No recurrent chest pain since admission.  - Continue aspirin and statin.  Carotid Artery Disease - Most recent carotid dopplers showed 1-39% stenosis of right ICA and 40-59% stenosis of left ICA as well as bilateral subclavian artery stenosis with right subclavian artery flow disturbed. Stable compared to prior imaging. - Plan is for repeat dopplers in 05/2020.  Hypertension - Patient has history of hypertension but BP has been more soft but stable recently. BP 102/58 today. - She does report some lightheadedness/dizziness with standing and quick position changes that resolves quickly. No falls or syncope. - Continue diuretic as above. - Advised patient to avoid quick position changes and stand slowly. Also recommend compression stockings to help with any potential orthostatic hypotension.  Hyperlipidemia - Continue Lipitor 20mg  daily.  COPD - She has chronic shortness of breath related to this that sounds stable overall.  Depression - Patient biggest complaint today is her depression. She states she has been very depressed lately and that this year has been hard. No suicidal thoughts. She lives by herself but has supportive family nearby. Planning to see her PCP in a couple of weeks. Recommended talking to her PCP about her depression at that time.   Disposition: Keep follow-up with [name], PA-C, on 12/14/2019. Also recommended follow-up with Dr. Harrington Challenger in 3 months.   Medication Adjustments/Labs and Tests Ordered: Current medicines are reviewed at length with the patient today.  Concerns regarding medicines are outlined above.  Orders Placed This Encounter  Procedures  . Basic Metabolic Panel (BMET)   No orders of the defined types were placed in this encounter.   Patient Instructions  Medication Instructions:  Your physician recommends that you continue on your current medications as directed. Please refer  to the Current Medication list given to you today.  *If you need a refill on your cardiac medications before your next appointment, please call your pharmacy*  Lab Work: Your physician recommends that you have lab work today: bmet.  If you have labs (blood work) drawn today and your tests are completely normal, you will receive your results only by: Marland Kitchen MyChart Message (if you have MyChart) OR . A paper copy in the mail If you have any lab test that is abnormal or we need to change your treatment, we will call you to review the results.  Testing/Procedures: -None  Follow-Up: At [facility], you and your health needs are our priority.  As part of our continuing mission to provide you with exceptional heart care, we have created designated Provider Care Teams.  These Care Teams  include your primary Cardiologist (physician) and Advanced Practice Providers (APPs -  Physician Assistants and Nurse Practitioners) who all work together to provide you with the care you need, when you need it.  Keep all you follow up appointments.     Signed, Darreld Mclean, PA-C  11/22/2019 1:43 PM    [facility]

## 2019-11-22 ENCOUNTER — Other Ambulatory Visit: Payer: Self-pay | Admitting: *Deleted

## 2019-11-22 ENCOUNTER — Ambulatory Visit (INDEPENDENT_AMBULATORY_CARE_PROVIDER_SITE_OTHER): Payer: Medicare HMO | Admitting: Student

## 2019-11-22 ENCOUNTER — Encounter: Payer: Self-pay | Admitting: Student

## 2019-11-22 ENCOUNTER — Other Ambulatory Visit: Payer: Self-pay

## 2019-11-22 VITALS — BP 102/58 | HR 84 | Ht 68.0 in | Wt 158.4 lb

## 2019-11-22 DIAGNOSIS — I5032 Chronic diastolic (congestive) heart failure: Secondary | ICD-10-CM | POA: Diagnosis not present

## 2019-11-22 DIAGNOSIS — F329 Major depressive disorder, single episode, unspecified: Secondary | ICD-10-CM | POA: Diagnosis not present

## 2019-11-22 DIAGNOSIS — I10 Essential (primary) hypertension: Secondary | ICD-10-CM | POA: Diagnosis not present

## 2019-11-22 DIAGNOSIS — J449 Chronic obstructive pulmonary disease, unspecified: Secondary | ICD-10-CM

## 2019-11-22 DIAGNOSIS — E785 Hyperlipidemia, unspecified: Secondary | ICD-10-CM | POA: Diagnosis not present

## 2019-11-22 DIAGNOSIS — N179 Acute kidney failure, unspecified: Secondary | ICD-10-CM

## 2019-11-22 DIAGNOSIS — I779 Disorder of arteries and arterioles, unspecified: Secondary | ICD-10-CM | POA: Diagnosis not present

## 2019-11-22 DIAGNOSIS — F32A Depression, unspecified: Secondary | ICD-10-CM

## 2019-11-22 DIAGNOSIS — I25119 Atherosclerotic heart disease of native coronary artery with unspecified angina pectoris: Secondary | ICD-10-CM

## 2019-11-22 LAB — BASIC METABOLIC PANEL
BUN/Creatinine Ratio: 24 (ref 12–28)
BUN: 52 mg/dL — ABNORMAL HIGH (ref 8–27)
CO2: 32 mmol/L — ABNORMAL HIGH (ref 20–29)
Calcium: 9.8 mg/dL (ref 8.7–10.3)
Chloride: 90 mmol/L — ABNORMAL LOW (ref 96–106)
Creatinine, Ser: 2.13 mg/dL — ABNORMAL HIGH (ref 0.57–1.00)
GFR calc Af Amer: 25 mL/min/{1.73_m2} — ABNORMAL LOW (ref >59)
GFR calc non Af Amer: 22 mL/min/{1.73_m2} — ABNORMAL LOW (ref >59)
Glucose: 96 mg/dL (ref 65–99)
Potassium: 3.7 mmol/L (ref 3.5–5.2)
Sodium: 138 mmol/L (ref 134–144)

## 2019-11-22 NOTE — Patient Instructions (Signed)
Medication Instructions:  Your physician recommends that you continue on your current medications as directed. Please refer to the Current Medication list given to you today.  *If you need a refill on your cardiac medications before your next appointment, please call your pharmacy*  Lab Work: Your physician recommends that you have lab work today: bmet.  If you have labs (blood work) drawn today and your tests are completely normal, you will receive your results only by: Marland Kitchen MyChart Message (if you have MyChart) OR . A paper copy in the mail If you have any lab test that is abnormal or we need to change your treatment, we will call you to review the results.  Testing/Procedures: -None  Follow-Up: At [facility], you and your health needs are our priority.  As part of our continuing mission to provide you with exceptional heart care, we have created designated Provider Care Teams.  These Care Teams include your primary Cardiologist (physician) and Advanced Practice Providers (APPs -  Physician Assistants and Nurse Practitioners) who all work together to provide you with the care you need, when you need it.  Keep all you follow up appointments.

## 2019-11-24 ENCOUNTER — Other Ambulatory Visit: Payer: Self-pay | Admitting: Internal Medicine

## 2019-11-29 ENCOUNTER — Telehealth: Payer: Self-pay | Admitting: Physician Assistant

## 2019-11-29 ENCOUNTER — Other Ambulatory Visit: Payer: Medicare HMO

## 2019-11-29 ENCOUNTER — Other Ambulatory Visit: Payer: Self-pay

## 2019-11-29 DIAGNOSIS — N179 Acute kidney failure, unspecified: Secondary | ICD-10-CM | POA: Diagnosis not present

## 2019-11-29 NOTE — Telephone Encounter (Signed)
New Message:      Daughter said pt had lab work her last week. Does she still need lab work here today?

## 2019-11-29 NOTE — Telephone Encounter (Signed)
Per (DPR) called pt's daughter back to explain why pt needed f/u labs due to increase creatinine.  Daughter stated would have pt in office to repeat bmet.  Will send to [facility] to [place].

## 2019-11-30 LAB — BASIC METABOLIC PANEL
BUN/Creatinine Ratio: 21 (ref 12–28)
BUN: 30 mg/dL — ABNORMAL HIGH (ref 8–27)
CO2: 29 mmol/L (ref 20–29)
Calcium: 9.3 mg/dL (ref 8.7–10.3)
Chloride: 100 mmol/L (ref 96–106)
Creatinine, Ser: 1.41 mg/dL — ABNORMAL HIGH (ref 0.57–1.00)
GFR calc Af Amer: 41 mL/min/{1.73_m2} — ABNORMAL LOW (ref >59)
GFR calc non Af Amer: 35 mL/min/{1.73_m2} — ABNORMAL LOW (ref >59)
Glucose: 102 mg/dL — ABNORMAL HIGH (ref 65–99)
Potassium: 4.1 mmol/L (ref 3.5–5.2)
Sodium: 142 mmol/L (ref 134–144)

## 2019-11-30 NOTE — Telephone Encounter (Signed)
Thank you :)

## 2019-12-01 ENCOUNTER — Other Ambulatory Visit: Payer: Self-pay

## 2019-12-01 DIAGNOSIS — J449 Chronic obstructive pulmonary disease, unspecified: Secondary | ICD-10-CM | POA: Diagnosis not present

## 2019-12-01 DIAGNOSIS — I110 Hypertensive heart disease with heart failure: Secondary | ICD-10-CM | POA: Diagnosis not present

## 2019-12-01 DIAGNOSIS — F329 Major depressive disorder, single episode, unspecified: Secondary | ICD-10-CM | POA: Diagnosis not present

## 2019-12-01 DIAGNOSIS — I5033 Acute on chronic diastolic (congestive) heart failure: Secondary | ICD-10-CM | POA: Diagnosis not present

## 2019-12-01 DIAGNOSIS — E039 Hypothyroidism, unspecified: Secondary | ICD-10-CM | POA: Diagnosis not present

## 2019-12-01 DIAGNOSIS — D509 Iron deficiency anemia, unspecified: Secondary | ICD-10-CM | POA: Diagnosis not present

## 2019-12-01 DIAGNOSIS — F419 Anxiety disorder, unspecified: Secondary | ICD-10-CM | POA: Diagnosis not present

## 2019-12-01 DIAGNOSIS — I2510 Atherosclerotic heart disease of native coronary artery without angina pectoris: Secondary | ICD-10-CM | POA: Diagnosis not present

## 2019-12-01 DIAGNOSIS — M109 Gout, unspecified: Secondary | ICD-10-CM | POA: Diagnosis not present

## 2019-12-01 NOTE — Patient Outreach (Signed)
[facility]) Care Management  [place]  12/01/2019   [name] Oct 04, 1941 [id number]  Subjective: Telephone call to patient for monthly call. Patient reports she is doing good.  She reports some arthritis pain.  She does not currently take anything but will discuss with physician on next visit.  Patient reports that her weight is good.  Today's weight was 165 lbs and blood pressure was 87/47.  Patient denies any dizziness or lightheadedness.  She states that sometimes her blood pressure is low like that but comes back up.  Advised patient that if blood pressure continues to read in the lower range to notify physician.  She verbalized understanding and states that her nurse is coming by today and will check it again today.  Patient denies any needs of concerns.    Objective:   Encounter Medications:  Outpatient Encounter Medications as of 12/01/2019  Medication Sig Note  . allopurinol (ZYLOPRIM) 100 MG tablet Take 100 mg by mouth daily.   Marland Kitchen aspirin EC 81 MG tablet Take 81 mg by mouth daily.   . Aspirin-Salicylamide-Caffeine (BC HEADACHE POWDER PO) Take 1 packet by mouth as needed (for headaches).   Marland Kitchen atorvastatin (LIPITOR) 10 MG tablet Take 1 tablet by mouth daily.   . citalopram (CELEXA) 40 MG tablet Take 40 mg by mouth at bedtime.   . CVS D3 1000 units capsule Take 1,000 Units by mouth daily.   . cyclobenzaprine (FLEXERIL) 10 MG tablet Take 10 mg by mouth 3 (three) times daily as needed for muscle spasms.    . ferrous sulfate 325 (65 FE) MG tablet Take 325 mg by mouth daily with breakfast.   . furosemide (LASIX) 40 MG tablet Take 40 mg by mouth daily in the afternoon. Daily in the afternoon   . furosemide (LASIX) 80 MG tablet Take 1 tablet (80 mg total) by mouth daily.   Marland Kitchen guaiFENesin (MUCINEX) 600 MG 12 hr tablet Take 600-1,200 mg by mouth 2 (two) times daily as needed for to loosen phlegm.    . INCRUSE ELLIPTA 62.5 MCG/INH AEPB Inhale 1 puff into the lungs  2 (two) times daily.    . Ipratropium-Albuterol (COMBIVENT) 20-100 MCG/ACT AERS respimat Inhale 1 puff into the lungs every 6 (six) hours as needed for wheezing or shortness of breath (or to help expel mucous).    Marland Kitchen levothyroxine (SYNTHROID) 50 MCG tablet Take 1 tablet (50 mcg total) by mouth daily at 6 (six) AM.   . magnesium oxide (MAG-OX) 400 (241.3 Mg) MG tablet Take 1 tablet (400 mg total) by mouth 2 (two) times daily.   . Melatonin 10 MG TABS Take 30 mg by mouth at bedtime. 10/04/2019: 3 tablets = 30 mg  . metolazone (ZAROXOLYN) 2.5 MG tablet Take 2.5 mg by mouth 2 (two) times a week. Take on Tuesday and Thursday   . neomycin-polymyxin-hydrocortisone (CORTISPORIN) 3.5-10000-1 OTIC suspension Place 5 drops into the right ear every morning.  10/04/2019: "Stop date" not yet known  . nitroGLYCERIN (NITROSTAT) 0.4 MG SL tablet Place 1 tablet (0.4 mg total) under the tongue every 5 (five) minutes as needed for chest pain. For chest pain (Patient taking differently: Place 0.4 mg under the tongue every 5 (five) minutes as needed for chest pain. )   . omeprazole (PRILOSEC) 40 MG capsule Take 40 mg by mouth at bedtime.    . Oxycodone HCl 10 MG TABS Take 10 mg by mouth every 6 (six) hours as needed (FOR PAIN).    Marland Kitchen  oxyCODONE-acetaminophen (PERCOCET) 10-325 MG tablet Take 1 tablet by mouth See admin instructions. Take 1 tablet by mouth in the morning, 1 tablet at 1 PM, 1 tablet at 5 PM, and 1 tablet at bedtime   . OXYGEN Inhale 4 L/min into the lungs as needed (DURING ALL TIMES OF EXERTION).    Marland Kitchen pilocarpine (PILOCAR) 1 % ophthalmic solution Place 1 drop into both eyes 2 (two) times daily.   . potassium chloride (K-DUR,KLOR-CON) 10 MEQ tablet Take 2 tablets (20 mEq total) by mouth daily.   . pramipexole (MIRAPEX) 0.25 MG tablet Take 0.25 mg by mouth at bedtime.   . roflumilast (DALIRESP) 500 MCG TABS tablet Take 500 mcg by mouth every evening.    Marland Kitchen spironolactone (ALDACTONE) 25 MG tablet TAKE 1 TABLET BY  MOUTH DAILY   . traZODone (DESYREL) 50 MG tablet Take 150 mg by mouth at bedtime.  10/04/2019: 3 tablets = 150 mg   No facility-administered encounter medications on file as of 12/01/2019.    Functional Status:  In your present state of health, do you have any difficulty performing the following activities: 10/11/2019 10/05/2019  Hearing? N N  Vision? N N  Difficulty concentrating or making decisions? N N  Walking or climbing stairs? Y Y  Comment patient has shortness of breath -  Dressing or bathing? N N  Doing errands, shopping? Y N  Comment family does shopping -  Conservation officer, nature and eating ? N -  Using the Toilet? N -  In the past six months, have you accidently leaked urine? Y -  Do you have problems with loss of bowel control? N -  Managing your Medications? Y -  Comment son helps with medications -  Managing your Finances? N -  Housekeeping or managing your Housekeeping? Y -  Comment family helps -  Some recent data might be hidden    Fall/Depression Screening: Fall Risk  10/11/2019 05/13/2018 12/07/2017  Falls in the past year? 1 Yes No  Comment - - -  Number falls in past yr: 1 1 -  Injury with Fall? 1 No -  Risk for fall due to : History of fall(s);Impaired balance/gait History of fall(s) -  Follow up Falls prevention discussed Falls evaluation completed -   PHQ 2/9 Scores 10/11/2019 10/12/2018 05/13/2018 11/04/2017 05/26/2017 03/24/2017 03/23/2017  PHQ - 2 Score 2 0 _0 PHQ- 9 Score 17 - _1 Assessment: Patient continues to manage chronic heart failure.  No recent hospitalizations.    Plan: [facility] CM Care Plan Problem One     Most Recent Value  Care Plan Problem One  Support Needs for Self- Health Management CHF  Role Documenting the Problem One  Care Management Telephonic Coordinator  Care Plan for Problem One  Active  [facility] Long Term Goal   Patient will not readmit to hospital within 31 days.  [facility] Long Term Goal Start Date  10/11/19  [facility] Long Term  Goal Met Date  12/01/19  [facility] CM Short Term Goal #1   Over the next 30 days, patient will demonstrate and/or verbalize understanding of self-health management for long term care of CHF.   [facility] CM Short Term Goal #1 Start Date  12/01/19  Interventions for Short Term Goal #1  RN CM reviewed with patient ways of monitoring heart failure, signs & symptoms, and when to notify physician.     RN CM will contact patient in the month  of January and patient agreeable.    [name], RN, MSN [place] [facility] Care Management Coordinator Direct Line [phone] Cell [phone] Toll Free: [phone]  Fax: [phone]

## 2019-12-08 DIAGNOSIS — Z79899 Other long term (current) drug therapy: Secondary | ICD-10-CM | POA: Diagnosis not present

## 2019-12-08 DIAGNOSIS — G894 Chronic pain syndrome: Secondary | ICD-10-CM | POA: Diagnosis not present

## 2019-12-08 DIAGNOSIS — Z5181 Encounter for therapeutic drug level monitoring: Secondary | ICD-10-CM | POA: Diagnosis not present

## 2019-12-13 NOTE — Progress Notes (Signed)
Cardiology Office Note:    Date:  12/14/2019   ID:  [name], DOB 1941/08/06, MRN [id number]  PCP:  Judy Roger, MD  Cardiologist:  Judy Carnes, MD  Electrophysiologist:  None   Referring MD: Judy Roger, MD   Chief Complaint  Patient presents with  . Follow-up    CHF    History of Present Illness:    [name] is a 78 y.o. female with:   Coronary artery disease  ? S/p CABG 2004 ? S/p DES to LM in 2010 ? Myoview 9/18: dist ant and apical ischemia; low risk  Chronic diastolic CHF ? Last admit in 09/2019  Carotid artery disease  Recurrent DVT/pulmonary embolism  ? anticoagulationDC'd in past due to GI bleed ? admx in 3/19 with multiple bilat PEs with L femoral DVT ? anticoagulationc/b large abd wall hemotoma >> s/p tx with PRBCs ? S/p IVC filter  PAD ? S/p SMA stenting in 2019 by [facility]  Hypertension   COPD  Cirrhosis  [name] was admitted in 09/2019 for CHF.  She was last seen 11/22/2019 by Judy Rives, PA-C.  Her diuretics had recently been adjusted at home for volume excess.  At her last appt, her volume status was improved.  She returns for follow-up.  She is here alone.  She arrives in a wheelchair.  She notes that her breathing has been fairly stable.  Her weights at home have also been stable.  She has not had chest pain.  She sleeps in recliner chronically.  She has not had paroxysmal nocturnal dyspnea.  Her lower extremity swelling is stable.  She does note significant issues with dysphagia, odynophagia and acid reflux.      Prior CV studies:   The following studies were reviewed today:    Echocardiogram 10/05/2019 EF 55-60, mild LVH, pseudonormalization (grade 2 diastolic dysfunction), moderate BAE, moderate MAC, mild MR, trivial TR, RVSP 34.5  Carotid US 06/06/2019 Summary: Right Carotid: Velocities in the right ICA are consistent with a 1-39% stenosis. Non-hemodynamically significant plaque <50% noted in the  CCA. The RICA velocities remain within normal range and stable compared to the previous exam.  Left Carotid: Velocities in the left ICA are consistent with a 40-59% stenosis, based on peak systolic velocities and plaque formation. Non-hemodynamically significant plaque noted in the CCA. The ECA appears >50% stenosed. The LICA velocities are elevated and stable compared to the previous exam.  Vertebrals: Left vertebral artery demonstrates antegrade flow. Small caliber right vertebral artery with atypical, antegrade flow. Subclavians: Bilateral subclavian arteries were stenotic. Right subclavian artery flow was disturbed.  Echocardiogram 11/09/2018 Mild concentric LVH, EF 60-65, normal wall motion, grade 1 diastolic dysfunction, severe MAC, trivial MR, mild BAE, trivial PI  Mesenteric vascular US 06/30/2018 Mesenteric: Normal Celiac artery , Splenic artery, Hepatic artery and Inferior Mesenteric artery findings. 70 to 99% stenosis in the superior mesenteric artery.  Mesenteric artery ultrasound 04/22/2018 FINAL INTERPRETATION: Mesenteric: 70 to 99% stenosis in the superior mesenteric artery, based on limited Visualization.  Echo 04/03/2018 EF 55-60, normal wall motion, MAC  Abdominal and chest CTA 04/01/2018 IMPRESSION: 1. No evidence of pulmonary embolism. Interval resolution of previously seen left lower lobe pulmonary emboli. 2. Unchanged moderate right and trace left pleural effusions with adjacent bibasilar atelectasis. 3. Relatively unchanged large anterior abdominal wall hematoma involving the space of Retzius, measuring 11.0 x 16.7 x 12.6 cm. 4. Aortic atherosclerosis (ICD10-I70.0).  Echo 03/02/2018 EF 55-60, normal wall motion, trivial AI, MAC,  RVSP 30  Echo 09/21/2017 Mild LVH, EF 08-65, grade 1 diastolic dysfunction, mild AI, moderate to severe  MAC, mild MR, mild BAE  Myoview 08/31/17  Nuclear stress EF: 65%.  Small region of distal anterior and apical ischemia, cannot exclude shifting breast attenuation. . Small anteroseptal defect consistent with probable soft tissue attenuation  This is a low risk study.     Past Medical History:  Diagnosis Date  . Anemia    bld. transfusion post lumbar surgery- 2012  . Anxiety   . Arthralgia    NOS  . Blood transfusion 2012; 02/2018   WITH BACK SURGERY; "related to hematoma in stomach" (10/06/2018)  . CAD (coronary artery disease)    s/p CABG 2004; s/p DES to LM in 2010;  [place] 10/29/11: EF 50-55%, mild elevated filling pressures, no pulmonary HTN, LM 90% ISR, LAD and CFX occluded, S-RI occluded (old), S-OM3 ok and L-LAD ok, native nondominant RCA 95% -  med rx recommended ; Lexiscan Myoview 7/13 [facility]: demonstrated "normal LV function, anterior attenuation and localized ischemia, inferior, basilar, mid section"  . Carotid artery disease ([place])    Carotid US 7/84:  RICA 6-96; LICA 29-52; R subclavian stenosis - Repeat 1 year. // Carotid US 05/2019: R 1-39; L 40-59; R vertebral with atypical antegrade flow; R subclavian stenosis >> repeat 1 year  . CHF (congestive heart failure) ([place])   . Chronic diastolic heart failure (HCC)    Echo 9/10: EF 84-13%, grade 1 diastolic dysfunction  . Chronic lower back pain   . COPD (chronic obstructive pulmonary disease) ([place])    Emphysema dxed by Dr. Woody Seller in [place] based on PFTs per pt in 2006; placed on albuterol  . Depression    TAKES CELEXA  AND  (OFF- WELBUTRIN)  . Diuretic-induced hypokalemia 10/08/2018  . DVT of lower extremity (deep venous thrombosis) (HCC)    recurrent. bilateral (2 episodes)  . Dyspnea   . Dysrhythmia    afib with cabg  . Exertional angina (HCC)    Treated with Isosorbide, Ranexa, amlodipine; intolerant to metoprolol  . GERD (gastroesophageal reflux disease)   . Gout    "on daily RX" (10/06/2018)  . HLD  (hyperlipidemia)   . Hypertension   . Hyperthyroidism   . Obesity (BMI 30-39.9) 2009   BMI 33  . Osteoarthritis    "all over; hands" (10/06/2018)  . Oxygen deficiency   . Oxygen dependent    4L at home as needed (10/06/2018)  . Pneumonia   . PVD (peripheral vascular disease) ([place])   . Sleep apnea 2012   USED CPAP THEN  STARTED USING SPIRIVA , Nov. 2013- last evaluation , changed from mask to aparatus that is just for her nose.. ; reports 05-28-18 "the mask smothers me so i dont use it right now" (10/06/2018)   Surgical Hx: The patient  has a past surgical history that includes Ankle surgery (2004); Groin mass open biopsy (2004); Total hip arthroplasty (2007); Reverse shoulder arthroplasty (02/26/2012); Shoulder arthroscopy with subacromial decompression (Left, 02/10/2013); Esophagogastroduodenoscopy (N/A, 02/14/2017); Givens capsule study (N/A, 02/15/2017); Colonoscopy with propofol (N/A, 02/17/2017); VISCERAL ANGIOGRAPHY (N/A, 03/05/2018); PERIPHERAL VASCULAR INTERVENTION (Left, 03/05/2018); VISCERAL ANGIOGRAPHY (N/A, 03/08/2018); PERIPHERAL VASCULAR INTERVENTION (03/08/2018); IR Paracentesis (03/10/2018); IR IVC FILTER PLMT / S&I /IMG GUID/MOD SED (03/14/2018); Transurethral resection of bladder tumor (N/A, 05/31/2018); Laparoscopic cholecystectomy; Tonsillectomy; Augmentation mammaplasty; Replacement total knee bilateral (Bilateral, 2012); Anterior cervical decomp/discectomy fusion (11/2007); Back surgery (1986); Cataract extraction w/ intraocular lens  implant, bilateral (Bilateral, 2006);  Joint replacement; Cardiac catheterization (11/2009); Coronary angioplasty with stent (11/2009); and Coronary artery bypass graft (2004).   Current Medications: Current Meds  Medication Sig  . allopurinol (ZYLOPRIM) 100 MG tablet Take 100 mg by mouth daily.  Marland Kitchen aspirin EC 81 MG tablet Take 81 mg by mouth daily.  . Aspirin-Salicylamide-Caffeine (BC HEADACHE POWDER PO) Take 1 packet by mouth as needed (for headaches).  Marland Kitchen  atorvastatin (LIPITOR) 10 MG tablet Take 1 tablet by mouth daily.  . citalopram (CELEXA) 40 MG tablet Take 40 mg by mouth at bedtime.  . CVS D3 1000 units capsule Take 1,000 Units by mouth daily.  . cyclobenzaprine (FLEXERIL) 10 MG tablet Take 10 mg by mouth 3 (three) times daily as needed for muscle spasms.   . ferrous sulfate 325 (65 FE) MG tablet Take 325 mg by mouth daily with breakfast.  . furosemide (LASIX) 40 MG tablet Take 40 mg by mouth daily in the afternoon. Daily in the afternoon  . furosemide (LASIX) 80 MG tablet Take 1 tablet (80 mg total) by mouth daily.  Marland Kitchen guaiFENesin (MUCINEX) 600 MG 12 hr tablet Take 600-1,200 mg by mouth 2 (two) times daily as needed for to loosen phlegm.   . INCRUSE ELLIPTA 62.5 MCG/INH AEPB Inhale 1 puff into the lungs 2 (two) times daily.   . Ipratropium-Albuterol (COMBIVENT) 20-100 MCG/ACT AERS respimat Inhale 1 puff into the lungs every 6 (six) hours as needed for wheezing or shortness of breath (or to help expel mucous).   Marland Kitchen levothyroxine (SYNTHROID) 50 MCG tablet Take 1 tablet (50 mcg total) by mouth daily at 6 (six) AM.  . magnesium oxide (MAG-OX) 400 (241.3 Mg) MG tablet Take 1 tablet (400 mg total) by mouth 2 (two) times daily.  . Melatonin 10 MG TABS Take 30 mg by mouth at bedtime.  . metolazone (ZAROXOLYN) 2.5 MG tablet Take 2.5 mg by mouth 2 (two) times a week. Take on Tuesday and Thursday  . neomycin-polymyxin-hydrocortisone (CORTISPORIN) 3.5-10000-1 OTIC suspension Place 5 drops into the right ear every morning.   . nitroGLYCERIN (NITROSTAT) 0.4 MG SL tablet Place 1 tablet (0.4 mg total) under the tongue every 5 (five) minutes as needed for chest pain. For chest pain (Patient taking differently: Place 0.4 mg under the tongue every 5 (five) minutes as needed for chest pain. )  . omeprazole (PRILOSEC) 40 MG capsule Take 1 capsule (40 mg total) by mouth 2 (two) times daily.  . Oxycodone HCl 10 MG TABS Take 10 mg by mouth every 6 (six) hours as needed  (FOR PAIN).   Marland Kitchen oxyCODONE-acetaminophen (PERCOCET) 10-325 MG tablet Take 1 tablet by mouth See admin instructions. Take 1 tablet by mouth in the morning, 1 tablet at 1 PM, 1 tablet at 5 PM, and 1 tablet at bedtime  . OXYGEN Inhale 4 L/min into the lungs as needed (DURING ALL TIMES OF EXERTION).   Marland Kitchen pilocarpine (PILOCAR) 1 % ophthalmic solution Place 1 drop into both eyes 2 (two) times daily.  . potassium chloride (K-DUR,KLOR-CON) 10 MEQ tablet Take 2 tablets (20 mEq total) by mouth daily.  . pramipexole (MIRAPEX) 0.25 MG tablet Take 0.25 mg by mouth at bedtime.  . roflumilast (DALIRESP) 500 MCG TABS tablet Take 500 mcg by mouth every evening.   Marland Kitchen spironolactone (ALDACTONE) 25 MG tablet TAKE 1 TABLET BY MOUTH DAILY  . traZODone (DESYREL) 50 MG tablet Take 150 mg by mouth at bedtime.   . [DISCONTINUED] omeprazole (PRILOSEC) 40 MG capsule Take 40 mg  by mouth 2 (two) times daily.     Allergies:   Ativan [lorazepam], Pitavastatin, Ropinirole, Zofran [ondansetron hcl], Zolpidem tartrate, and Penicillins   Social History   Tobacco Use  . Smoking status: Former Smoker    Packs/day: 1.00    Years: 45.00    Pack years: 45.00    Types: Cigarettes    Quit date: 12/22/2000    Years since quitting: 18.9  . Smokeless tobacco: Never Used  Substance Use Topics  . Alcohol use: Never    Alcohol/week: 0.0 standard drinks  . Drug use: Never     Family Hx: The patient's family history includes COPD in her father; Cancer in her mother, sister, and another family member; Colitis in an other family member; Heart disease in her father, sister, and another family member; Lung cancer in her father; Ovarian cancer in her mother.  ROS:   Please see the history of present illness.    Review of Systems  Gastrointestinal: Negative for hematochezia and melena.  Genitourinary: Negative for hematuria.   All other systems reviewed and are negative.   EKGs/Labs/Other Test Reviewed:    EKG:  EKG is not ordered  today.  The ekg ordered today demonstrates n/a  Recent Labs: 10/04/2019: ALT 10; B Natriuretic Peptide 232.5; Hemoglobin 11.6; Platelets 263 10/05/2019: Magnesium 2.0; TSH 6.419 11/29/2019: BUN 30; Creatinine, Ser 1.41; Potassium 4.1; Sodium 142   Recent Lipid Panel Lab Results  Component Value Date/Time   CHOL 106 07/01/2018 08:00 AM   CHOL 98 05/23/2014 08:57 AM   TRIG 88 07/01/2018 08:00 AM   TRIG 83 05/23/2014 08:57 AM   HDL 38 (L) 07/01/2018 08:00 AM   HDL 35 (L) 05/23/2014 08:57 AM   CHOLHDL 2.8 07/01/2018 08:00 AM   CHOLHDL 4 03/03/2013 11:36 AM   LDLCALC 50 07/01/2018 08:00 AM   LDLCALC 46 05/23/2014 08:57 AM    Physical Exam:    VS:  BP (!) 102/48   Pulse 83   Ht _0  (1.727 m)   Wt 166 lb 3.2 oz (75.4 kg)   SpO2 91%   BMI 25.27 kg/m     Wt Readings from Last 3 Encounters:  12/14/19 166 lb 3.2 oz (75.4 kg)  11/22/19 158 lb 6.4 oz (71.8 kg)  10/19/19 165 lb (74.8 kg)     Physical Exam  Constitutional: She is oriented to person, place, and time. She appears well-developed and well-nourished. No distress.  HENT:  Head: Normocephalic and atraumatic.  Eyes: No scleral icterus.  Neck: No JVD (at 90 degrees) present. No thyromegaly present.  Cardiovascular: Normal rate and regular rhythm.  Murmur heard.  Systolic murmur is present with a grade of 2/6 at the upper right sternal border and upper left sternal border. Pulmonary/Chest: She has decreased breath sounds. She has no rales.  Abdominal: Soft.  Musculoskeletal:        General: Edema (1+ RLE, trace LLE edema) present.  Lymphadenopathy:    She has no cervical adenopathy.  Neurological: She is alert and oriented to person, place, and time.  Skin: Skin is warm and dry.  Psychiatric: She has a normal mood and affect.    ASSESSMENT & PLAN:    1. Chronic diastolic heart failure (HCC) Overall, her volume status seems to be stable.  Her weight is up 8 pounds by our scales.  Her weights at home have been  fairly stable.  However, she does feel that she is caring more fluid in her abdomen.  I  have recommended increasing her furosemide to 80 mg twice a day for 2 days.  She will then resume 80 mg in the morning and 40 mg in the afternoon.  She continues to take metolazone twice a week.  Her next dose is tomorrow.  Most recent creatinine was stable.  If she continues to have issues with managing volume, we should consider changing her furosemide to torsemide.  She knows to contact us if her fluid status does not improve in the next few days.  She can follow-up with Dr. Harrington Challenger or me in 3 months.  2. Coronary artery disease involving native coronary artery of native heart with angina pectoris ([place]) History of CABG in 2004 and drug-eluting stent to the left main in 2010.  Myoview in 2018 was low risk.  She is not having anginal symptoms.  Continue aspirin, atorvastatin.  3. Essential hypertension The patient's blood pressure is controlled on her current regimen.  Continue current therapy.   4. Mechanical dysphagia She describes issues with dysphagia as well as odynophagia and acid reflux.  She is already on omeprazole 40 mg daily.  I have recommended that she increase this to twice daily.  I have also recommended that we refer her to gastroenterology for further evaluation and management.   Dispo:  Return in 3 months (on 03/08/2020) for Scheduled Follow Up, w/ Dr. Harrington Challenger.   Medication Adjustments/Labs and Tests Ordered: Current medicines are reviewed at length with the patient today.  Concerns regarding medicines are outlined above.  Tests Ordered: Orders Placed This Encounter  Procedures  . Ambulatory referral to Gastroenterology   Medication Changes: Meds ordered this encounter  Medications  . omeprazole (PRILOSEC) 40 MG capsule    Sig: Take 1 capsule (40 mg total) by mouth 2 (two) times daily.    Dispense:  180 capsule    Refill:  1    Signed, [name], PA-C  12/14/2019 11:33 AM    [facility] [address], [place], [place]  [address] Phone: [phone]; Fax: [phone]

## 2019-12-14 ENCOUNTER — Ambulatory Visit (INDEPENDENT_AMBULATORY_CARE_PROVIDER_SITE_OTHER): Payer: Medicare HMO | Admitting: Physician Assistant

## 2019-12-14 ENCOUNTER — Other Ambulatory Visit: Payer: Self-pay

## 2019-12-14 ENCOUNTER — Encounter: Payer: Self-pay | Admitting: Physician Assistant

## 2019-12-14 VITALS — BP 102/48 | HR 83 | Ht 68.0 in | Wt 166.2 lb

## 2019-12-14 DIAGNOSIS — I25119 Atherosclerotic heart disease of native coronary artery with unspecified angina pectoris: Secondary | ICD-10-CM | POA: Diagnosis not present

## 2019-12-14 DIAGNOSIS — K219 Gastro-esophageal reflux disease without esophagitis: Secondary | ICD-10-CM

## 2019-12-14 DIAGNOSIS — R1319 Other dysphagia: Secondary | ICD-10-CM | POA: Diagnosis not present

## 2019-12-14 DIAGNOSIS — I5032 Chronic diastolic (congestive) heart failure: Secondary | ICD-10-CM

## 2019-12-14 DIAGNOSIS — I10 Essential (primary) hypertension: Secondary | ICD-10-CM

## 2019-12-14 MED ORDER — OMEPRAZOLE 40 MG PO CPDR: 40.0000 mg | DELAYED_RELEASE_CAPSULE | Freq: Two times a day (BID) | ORAL | 1 refills | Status: DC

## 2019-12-14 NOTE — Patient Instructions (Addendum)
Medication Instructions:   Your physician has recommended you make the following change in your medication:   1) Increase your Omeprazole to 40MG , 1 tablet by mouth twice a day 2) Increase your afternoon dose of Furosemide to 80MG  (2 tablets) for today and tomorrow. Friday go back to 80MG  (2 tablets) in the morning and 40MG  (1 tablet) in the afternoon.   *If you need a refill on your cardiac medications before your next appointment, please call your pharmacy*  Lab Work:  None ordered today  Testing/Procedures:  None ordered today  Follow-Up:  On 03/08/20 at 11:20AM  Other Instructions  You have been referred to Gastroenterology. Call us if fluid does not feel like it is getting better in 3-4 days.

## 2019-12-19 ENCOUNTER — Ambulatory Visit (INDEPENDENT_AMBULATORY_CARE_PROVIDER_SITE_OTHER): Payer: Medicare HMO | Admitting: Physician Assistant

## 2019-12-19 ENCOUNTER — Encounter: Payer: Self-pay | Admitting: Physician Assistant

## 2019-12-19 VITALS — BP 114/60 | HR 88 | Temp 98.2°F | Ht 65.5 in | Wt 173.0 lb

## 2019-12-19 DIAGNOSIS — I5033 Acute on chronic diastolic (congestive) heart failure: Secondary | ICD-10-CM | POA: Diagnosis not present

## 2019-12-19 DIAGNOSIS — K219 Gastro-esophageal reflux disease without esophagitis: Secondary | ICD-10-CM

## 2019-12-19 DIAGNOSIS — F419 Anxiety disorder, unspecified: Secondary | ICD-10-CM | POA: Diagnosis not present

## 2019-12-19 DIAGNOSIS — F329 Major depressive disorder, single episode, unspecified: Secondary | ICD-10-CM | POA: Diagnosis not present

## 2019-12-19 DIAGNOSIS — I110 Hypertensive heart disease with heart failure: Secondary | ICD-10-CM | POA: Diagnosis not present

## 2019-12-19 DIAGNOSIS — J449 Chronic obstructive pulmonary disease, unspecified: Secondary | ICD-10-CM | POA: Diagnosis not present

## 2019-12-19 DIAGNOSIS — E039 Hypothyroidism, unspecified: Secondary | ICD-10-CM | POA: Diagnosis not present

## 2019-12-19 DIAGNOSIS — M109 Gout, unspecified: Secondary | ICD-10-CM | POA: Diagnosis not present

## 2019-12-19 DIAGNOSIS — I2510 Atherosclerotic heart disease of native coronary artery without angina pectoris: Secondary | ICD-10-CM | POA: Diagnosis not present

## 2019-12-19 DIAGNOSIS — D509 Iron deficiency anemia, unspecified: Secondary | ICD-10-CM | POA: Diagnosis not present

## 2019-12-19 MED ORDER — OMEPRAZOLE 40 MG PO CPDR: 40.0000 mg | DELAYED_RELEASE_CAPSULE | Freq: Two times a day (BID) | ORAL | 3 refills | Status: DC

## 2019-12-19 NOTE — Progress Notes (Signed)
Subjective:    Patient ID: [name], female    DOB: August 21, 1941, 78 y.o.   MRN: [id number]  HPI [name] is a pleasant 78 year old white female, known to Dr. Hilarie Fredrickson and last seen during hospitalization in February 2018 with GI bleeding.  She has history of small bowel AVMs. She comes in today with recent complaints of heartburn dysphagia and odynophagia.  She was seen by cardiology last week and mentioned those complaints . Patient has multiple comorbidities including hypertension, coronary artery disease for which she status post CABG in 2004, drug-eluting stent 2010.  Also with peripheral arterial disease status post SMA stent 2019, history of small bowel AVMs, prior history of PE, congestive heart failure, COPD with chronic O2 use. Colonoscopy in February 2018 revealed multiple diverticuli, external hemorrhoids she had a 3 to 4 mm polyp removed from the rectum.  EGD at that time showed mild erosive gastritis and capsule endoscopy February 2018 showed multiple small bowel AVMs. Patient is generally on omeprazole 40 mg p.o. twice daily.  She says over the past 2 to 3 weeks she had been having a lot of burning in her esophagus and sour bitter reflux.  This developed into dysphagia and odynophagia.  She says she is feeling a lot better currently and her symptoms have actually resolved.  Looking back she believes that her omeprazole had been left out of her medications which are usually sorted out for her per her granddaughter.  She has been back on twice daily omeprazole over the past week or so and says she feels much better and has not had any dysphagia or odynophagia over the past few days.  She has no complaints of abdominal pain.  Appetite has been fine.  She had not been on any recent antibiotics and no new recent medications.  Review of Systems Pertinent positive and negative review of systems were noted in the above HPI section.  All other review of systems was otherwise negative.  Outpatient  Encounter Medications as of 12/19/2019  Medication Sig   allopurinol (ZYLOPRIM) 100 MG tablet Take 100 mg by mouth daily.   aspirin EC 81 MG tablet Take 81 mg by mouth daily.   Aspirin-Salicylamide-Caffeine (BC HEADACHE POWDER PO) Take 1 packet by mouth as needed (for headaches).   atorvastatin (LIPITOR) 10 MG tablet Take 1 tablet by mouth daily.   citalopram (CELEXA) 40 MG tablet Take 40 mg by mouth at bedtime.   [employer] D3 1000 units capsule Take 1,000 Units by mouth daily.   cyclobenzaprine (FLEXERIL) 10 MG tablet Take 10 mg by mouth 3 (three) times daily as needed for muscle spasms.    ferrous sulfate 325 (65 FE) MG tablet Take 325 mg by mouth daily with breakfast.   furosemide (LASIX) 40 MG tablet Take 40 mg by mouth daily in the afternoon. Daily in the afternoon   furosemide (LASIX) 80 MG tablet Take 1 tablet (80 mg total) by mouth daily.   guaiFENesin (MUCINEX) 600 MG 12 hr tablet Take 600-1,200 mg by mouth 2 (two) times daily as needed for to loosen phlegm.    INCRUSE ELLIPTA 62.5 MCG/INH AEPB Inhale 1 puff into the lungs 2 (two) times daily.    Ipratropium-Albuterol (COMBIVENT) 20-100 MCG/ACT AERS respimat Inhale 1 puff into the lungs every 6 (six) hours as needed for wheezing or shortness of breath (or to help expel mucous).    levothyroxine (SYNTHROID) 50 MCG tablet Take 1 tablet (50 mcg total) by mouth daily at 6 (six)  AM.   magnesium oxide (MAG-OX) 400 (241.3 Mg) MG tablet Take 1 tablet (400 mg total) by mouth 2 (two) times daily.   Melatonin 10 MG TABS Take 30 mg by mouth at bedtime.   metolazone (ZAROXOLYN) 2.5 MG tablet Take 2.5 mg by mouth 2 (two) times a week. Take on Tuesday and Thursday   neomycin-polymyxin-hydrocortisone (CORTISPORIN) 3.5-10000-1 OTIC suspension Place 5 drops into the right ear every morning.    omeprazole (PRILOSEC) 40 MG capsule Take 1 capsule (40 mg total) by mouth 2 (two) times daily.   Oxycodone HCl 10 MG TABS Take 10 mg by mouth every 6  (six) hours as needed (FOR PAIN).    OXYGEN Inhale 4 L/min into the lungs as needed (DURING ALL TIMES OF EXERTION).    pilocarpine (PILOCAR) 1 % ophthalmic solution Place 1 drop into both eyes 2 (two) times daily.   potassium chloride (K-DUR,KLOR-CON) 10 MEQ tablet Take 2 tablets (20 mEq total) by mouth daily.   pramipexole (MIRAPEX) 0.25 MG tablet Take 0.25 mg by mouth at bedtime.   roflumilast (DALIRESP) 500 MCG TABS tablet Take 500 mcg by mouth every evening.    spironolactone (ALDACTONE) 25 MG tablet TAKE 1 TABLET BY MOUTH DAILY   traZODone (DESYREL) 50 MG tablet Take 150 mg by mouth at bedtime.    [DISCONTINUED] omeprazole (PRILOSEC) 40 MG capsule Take 1 capsule (40 mg total) by mouth 2 (two) times daily.   nitroGLYCERIN (NITROSTAT) 0.4 MG SL tablet Place 1 tablet (0.4 mg total) under the tongue every 5 (five) minutes as needed for chest pain. For chest pain (Patient not taking: Reported on 12/19/2019)   [DISCONTINUED] oxyCODONE-acetaminophen (PERCOCET) 10-325 MG tablet Take 1 tablet by mouth See admin instructions. Take 1 tablet by mouth in the morning, 1 tablet at 1 PM, 1 tablet at 5 PM, and 1 tablet at bedtime   No facility-administered encounter medications on file as of 12/19/2019.   Allergies  Allergen Reactions   Ativan [Lorazepam] Other (See Comments)    Confusion    Pitavastatin Other (See Comments)    LIVALO: reaction not recalled   Ropinirole Nausea Only and Other (See Comments)    Makes legs jump, also   Zofran [Ondansetron Hcl] Nausea Only   Zolpidem Tartrate Anxiety and Other (See Comments)    CONFUSION    Penicillins Rash and Other (See Comments)    Tolerated cephalosporins in past Has patient had a PCN reaction causing immediate rash, facial/tongue/throat swelling, SOB or lightheadedness with hypotension: Yes Has patient had a PCN reaction causing severe rash involving mucus membranes or skin necrosis: No Has patient had a PCN reaction that required  hospitalization: No Has patient had a PCN reaction occurring within the last 10 years: No If all of the above answers are "NO", then may proceed with Cephalosporin use.    Patient Active Problem List   Diagnosis Date Noted   History of pulmonary embolism 10/04/2019   Dyspnea 12/24/2018   Cellulitis 12/24/2018   Otitis media 12/24/2018   Diuretic-induced hypokalemia 10/08/2018   Acute on chronic diastolic heart failure ([place]) 10/06/2018   Hematuria 04/01/2018   Acute lower UTI 04/01/2018   Hematoma of abdominal wall, subsequent encounter 04/01/2018   Chronic diastolic heart failure ([place]) 03/29/2018   GERD without esophagitis 03/29/2018   Chronic gout 03/29/2018   Chronic bilateral low back pain without sciatica 03/29/2018   Colitis 03/10/2018   Ascites    Pulmonary embolism ([place]) 03/01/2018   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[place])  03/01/2018   Acute on chronic respiratory failure with hypoxia ([place]) 03/01/2018   Angiodysplasia of intestine    Hematochezia    Malnutrition of moderate degree 02/15/2017   Blood loss anemia 02/13/2017   Impingement syndrome of left shoulder 02/10/2013   PAD (peripheral artery disease) ([place]) 11/17/2011   Dyslipidemia 10/17/2011   Coronary artery disease involving native coronary artery of native heart with angina pectoris ([place])    Hypothyroidism 05/03/2008   Essential hypertension 05/03/2008   Sleep apnea 05/03/2008   COPD (chronic obstructive pulmonary disease) ([place]) 04/12/2008   Social History   Socioeconomic History   Marital status: Widowed    Spouse name: Not on file   Number of children: 3   Years of education: Not on file   Highest education level: Not on file  Occupational History   Occupation: retired    Comment: Electronics engineer: RETIRED  Tobacco Use   Smoking status: Former Smoker    Packs/day: 1.00    Years: 45.00    Pack years: 45.00    Types: Cigarettes    Quit date: 12/22/2000     Years since quitting: 19.0   Smokeless tobacco: Never Used  Substance and Sexual Activity   Alcohol use: Never    Alcohol/week: 0.0 standard drinks   Drug use: Never   Sexual activity: Not Currently  Other Topics Concern   Not on file  Social History Narrative      3 grown children.   Social Determinants of Health   Financial Resource Strain:    Difficulty of Paying Living Expenses: Not on file  Food Insecurity: No Food Insecurity   Worried About Charity fundraiser in the Last Year: Never true   Ran Out of Food in the Last Year: Never true  Transportation Needs: No Transportation Needs   Lack of Transportation (Medical): No   Lack of Transportation (Non-Medical): No  Physical Activity:    Days of Exercise per Week: Not on file   Minutes of Exercise per Session: Not on file  Stress:    Feeling of Stress : Not on file  Social Connections:    Frequency of Communication with Friends and Family: Not on file   Frequency of Social Gatherings with Friends and Family: Not on file   Attends Religious Services: Not on file   Active Member of Clubs or Organizations: Not on file   Attends Archivist Meetings: Not on file   Marital Status: Not on file  Intimate Partner Violence:    Fear of Current or Ex-Partner: Not on file   Emotionally Abused: Not on file   Physically Abused: Not on file   Sexually Abused: Not on file    Ms. Murrah family history includes COPD in her father; Colitis in her sister; Heart disease in her father and sister; Lung cancer in her father; Ovarian cancer in her mother; Stomach cancer in her sister.      Objective:    Vitals:   12/19/19 0857  BP: 114/60  Pulse: 88  Temp: 98.2 F (36.8 C)    Physical Exam Well-developed well-nourished elderly white female in no acute distress.  Very pleasant, ambulates with a walker.   Weight 173, BMI 28.3  HEENT; nontraumatic normocephalic, EOMI, PE RR LA, sclera  anicteric. Oropharynx; not examined/mask/Covid Neck; supple, no JVD Cardiovascular; regular rate and rhythm with S1-S2, no murmur rub or gallop Pulmonary; few basilar crackles bilaterally Abdomen; soft, nontender, nondistended, no palpable mass or  hepatosplenomegaly, bowel sounds are active Rectal; not done Skin; benign exam, no jaundice rash or appreciable lesions Extremities; no clubbing cyanosis or edema skin warm and dry Neuro/Psych; alert and oriented x4, grossly nonfocal mood and affect appropriate       Assessment & Plan:   #76 78 year old white female with recent bout of heartburn, acid reflux dysphagia and odynophagia, now resolved after resuming twice daily omeprazole 40 mg. Patient has been on chronic twice daily PPI for chronic GERD.  It sounds as if her medication regimen had inadvertently been altered and omeprazole had not been given for several weeks.  Suspect acute esophagitis, resolved  #2 coronary artery disease status post CABG and drug-eluting stent #3 prior history of PE #4.  History of small bowel AVMs  #5.  Diverticulosis #6.  Peripheral arterial disease status post SMA stent 2019 #7 congestive heart failure #8.  COPD with chronic O2 use #9 status post cholecystectomy  Plan; continue omeprazole 40 mg p.o. twice daily, refills sent We discussed antireflux regimen including n.p.o. for 2 to 3 hours prior to bedtime and elevation of the head of the bed.  Antireflux diet I do not think she needs any further work-up at this time, if symptoms recur consider barium swallow as initial exam as she is high risk for complications with sedation given her numerous comorbidities and chronic oxygen use. She is asked to call should she have any recurrence of symptoms.  Guneet Delpino S Sanad Fearnow PA-C 12/19/2019   Cc: [name] T, PA-C

## 2019-12-19 NOTE — Patient Instructions (Addendum)
If you are age 79 or older, your body mass index should be between 23-30. Your Body mass index is 28.35 kg/m. If this is out of the aforementioned range listed, please consider follow up with your Primary Care Provider.  If you are age 67 or younger, your body mass index should be between 19-25. Your Body mass index is 28.35 kg/m. If this is out of the aformentioned range listed, please consider follow up with your Primary Care Provider.   Please call and speak to the nurse if symptoms return. [phone]   Food Choices for Gastroesophageal Reflux Disease, Adult When you have gastroesophageal reflux disease (GERD), the foods you eat and your eating habits are very important. Choosing the right foods can help ease your discomfort. Think about working with a nutrition specialist (dietitian) to help you make good choices. What are tips for following this plan?  Meals  Choose healthy foods that are low in fat, such as fruits, vegetables, whole grains, low-fat dairy products, and lean meat, fish, and poultry.  Eat small meals often instead of 3 large meals a day. Eat your meals slowly, and in a place where you are relaxed. Avoid bending over or lying down until 2-3 hours after eating.  Avoid eating meals 2-3 hours before bed.  Avoid drinking a lot of liquid with meals.  Cook foods using methods other than frying. Bake, grill, or broil food instead.  Avoid or limit: ? Chocolate. ? Peppermint or spearmint. ? Alcohol. ? Pepper. ? Black and decaffeinated coffee. ? Black and decaffeinated tea. ? Bubbly (carbonated) soft drinks. ? Caffeinated energy drinks and soft drinks.  Limit high-fat foods such as: ? Fatty meat or fried foods. ? Whole milk, cream, butter, or ice cream. ? Nuts and nut butters. ? Pastries, donuts, and sweets made with butter or shortening.  Avoid foods that cause symptoms. These foods may be different for everyone. Common foods that cause symptoms  include: ? Tomatoes. ? Oranges, lemons, and limes. ? Peppers. ? Spicy food. ? Onions and garlic. ? Vinegar. Lifestyle  Maintain a healthy weight. Ask your doctor what weight is healthy for you. If you need to lose weight, work with your doctor to do so safely.  Exercise for at least 30 minutes for 5 or more days each week, or as told by your doctor.  Wear loose-fitting clothes.  Do not smoke. If you need help quitting, ask your doctor.  Sleep with the head of your bed higher than your feet. Use a wedge under the mattress or blocks under the bed frame to raise the head of the bed. Summary  When you have gastroesophageal reflux disease (GERD), food and lifestyle choices are very important in easing your symptoms.  Eat small meals often instead of 3 large meals a day. Eat your meals slowly, and in a place where you are relaxed.  Limit high-fat foods such as fatty meat or fried foods.  Avoid bending over or lying down until 2-3 hours after eating.  Avoid peppermint and spearmint, caffeine, alcohol, and chocolate. This information is not intended to replace advice given to you by your health care provider. Make sure you discuss any questions you have with your health care provider. Document Released: 06/08/2012 Document Revised: 03/31/2019 Document Reviewed: 01/13/2017 Elsevier Patient Education  [employer].  Gastroesophageal Reflux Disease, Adult Gastroesophageal reflux (GER) happens when acid from the stomach flows up into the tube that connects the mouth and the stomach (esophagus). Normally, food travels down  the esophagus and stays in the stomach to be digested. With GER, food and stomach acid sometimes move back up into the esophagus. You may have a disease called gastroesophageal reflux disease (GERD) if the reflux:  Happens often.  Causes frequent or very bad symptoms.  Causes problems such as damage to the esophagus. When this happens, the esophagus becomes sore  and swollen (inflamed). Over time, GERD can make small holes (ulcers) in the lining of the esophagus. What are the causes? This condition is caused by a problem with the muscle between the esophagus and the stomach. When this muscle is weak or not normal, it does not close properly to keep food and acid from coming back up from the stomach. The muscle can be weak because of:  Tobacco use.  Pregnancy.  Having a certain type of hernia (hiatal hernia).  Alcohol use.  Certain foods and drinks, such as coffee, chocolate, onions, and peppermint. What increases the risk? You are more likely to develop this condition if you:  Are overweight.  Have a disease that affects your connective tissue.  Use NSAID medicines. What are the signs or symptoms? Symptoms of this condition include:  Heartburn.  Difficult or painful swallowing.  The feeling of having a lump in the throat.  A bitter taste in the mouth.  Bad breath.  Having a lot of saliva.  Having an upset or bloated stomach.  Belching.  Chest pain. Different conditions can cause chest pain. Make sure you see your doctor if you have chest pain.  Shortness of breath or noisy breathing (wheezing).  Ongoing (chronic) cough or a cough at night.  Wearing away of the surface of teeth (tooth enamel).  Weight loss. How is this treated? Treatment will depend on how bad your symptoms are. Your doctor may suggest:  Changes to your diet.  Medicine.  Surgery. Follow these instructions at home: Eating and drinking   Follow a diet as told by your doctor. You may need to avoid foods and drinks such as: ? Coffee and tea (with or without caffeine). ? Drinks that contain alcohol. ? Energy drinks and sports drinks. ? Bubbly (carbonated) drinks or sodas. ? Chocolate and cocoa. ? Peppermint and mint flavorings. ? Garlic and onions. ? Horseradish. ? Spicy and acidic foods. These include peppers, chili powder, curry powder,  vinegar, hot sauces, and BBQ sauce. ? Citrus fruit juices and citrus fruits, such as oranges, lemons, and limes. ? Tomato-based foods. These include red sauce, chili, salsa, and pizza with red sauce. ? Fried and fatty foods. These include donuts, french fries, potato chips, and high-fat dressings. ? High-fat meats. These include hot dogs, rib eye steak, sausage, ham, and bacon. ? High-fat dairy items, such as whole milk, butter, and cream cheese.  Eat small meals often. Avoid eating large meals.  Avoid drinking large amounts of liquid with your meals.  Avoid eating meals during the 2-3 hours before bedtime.  Avoid lying down right after you eat.  Do not exercise right after you eat. Lifestyle   Do not use any products that contain nicotine or tobacco. These include cigarettes, e-cigarettes, and chewing tobacco. If you need help quitting, ask your doctor.  Try to lower your stress. If you need help doing this, ask your doctor.  If you are overweight, lose an amount of weight that is healthy for you. Ask your doctor about a safe weight loss goal. General instructions  Pay attention to any changes in your symptoms.  Take  over-the-counter and prescription medicines only as told by your doctor. Do not take aspirin, ibuprofen, or other NSAIDs unless your doctor says it is okay.  Wear loose clothes. Do not wear anything tight around your waist.  Raise (elevate) the head of your bed about 6 inches (15 cm).  Avoid bending over if this makes your symptoms worse.  Keep all follow-up visits as told by your doctor. This is important. Contact a doctor if:  You have new symptoms.  You lose weight and you do not know why.  You have trouble swallowing or it hurts to swallow.  You have wheezing or a cough that keeps happening.  Your symptoms do not get better with treatment.  You have a hoarse voice. Get help right away if:  You have pain in your arms, neck, jaw, teeth, or  back.  You feel sweaty, dizzy, or light-headed.  You have chest pain or shortness of breath.  You throw up (vomit) and your throw-up looks like blood or coffee grounds.  You pass out (faint).  Your poop (stool) is bloody or black.  You cannot swallow, drink, or eat. Summary  If a person has gastroesophageal reflux disease (GERD), food and stomach acid move back up into the esophagus and cause symptoms or problems such as damage to the esophagus.  Treatment will depend on how bad your symptoms are.  Follow a diet as told by your doctor.  Take all medicines only as told by your doctor. This information is not intended to replace advice given to you by your health care provider. Make sure you discuss any questions you have with your health care provider. Document Released: 05/26/2008 Document Revised: 06/16/2018 Document Reviewed: 06/16/2018 Elsevier Patient Education  [employer].   It was a pleasure to see you today!   Amy Esterwood, PA-C

## 2019-12-26 DIAGNOSIS — J449 Chronic obstructive pulmonary disease, unspecified: Secondary | ICD-10-CM | POA: Diagnosis not present

## 2019-12-28 DIAGNOSIS — M25552 Pain in left hip: Secondary | ICD-10-CM | POA: Diagnosis not present

## 2019-12-29 ENCOUNTER — Telehealth: Payer: Self-pay | Admitting: Internal Medicine

## 2019-12-29 DIAGNOSIS — I10 Essential (primary) hypertension: Secondary | ICD-10-CM | POA: Diagnosis not present

## 2019-12-29 DIAGNOSIS — M109 Gout, unspecified: Secondary | ICD-10-CM | POA: Diagnosis not present

## 2019-12-29 DIAGNOSIS — K7460 Unspecified cirrhosis of liver: Secondary | ICD-10-CM | POA: Diagnosis not present

## 2019-12-29 DIAGNOSIS — Z1389 Encounter for screening for other disorder: Secondary | ICD-10-CM | POA: Diagnosis not present

## 2019-12-29 DIAGNOSIS — E039 Hypothyroidism, unspecified: Secondary | ICD-10-CM | POA: Diagnosis not present

## 2019-12-29 DIAGNOSIS — F411 Generalized anxiety disorder: Secondary | ICD-10-CM | POA: Diagnosis not present

## 2019-12-29 DIAGNOSIS — F331 Major depressive disorder, recurrent, moderate: Secondary | ICD-10-CM | POA: Diagnosis not present

## 2019-12-29 DIAGNOSIS — Z0000 Encounter for general adult medical examination without abnormal findings: Secondary | ICD-10-CM | POA: Diagnosis not present

## 2019-12-29 DIAGNOSIS — I25810 Atherosclerosis of coronary artery bypass graft(s) without angina pectoris: Secondary | ICD-10-CM | POA: Diagnosis not present

## 2019-12-29 DIAGNOSIS — E7800 Pure hypercholesterolemia, unspecified: Secondary | ICD-10-CM | POA: Diagnosis not present

## 2019-12-29 DIAGNOSIS — J449 Chronic obstructive pulmonary disease, unspecified: Secondary | ICD-10-CM | POA: Diagnosis not present

## 2019-12-29 DIAGNOSIS — N1832 Chronic kidney disease, stage 3b: Secondary | ICD-10-CM | POA: Diagnosis not present

## 2019-12-29 NOTE — Telephone Encounter (Signed)
Encounter not needed

## 2019-12-30 NOTE — Progress Notes (Signed)
Addendum: Reviewed and agree with assessment and management plan. Drucilla Cumber M, MD  

## 2020-01-06 ENCOUNTER — Other Ambulatory Visit: Payer: Self-pay | Admitting: *Deleted

## 2020-01-06 ENCOUNTER — Ambulatory Visit: Payer: Self-pay

## 2020-01-06 NOTE — Patient Outreach (Signed)
[facility]) Care Management  [facility] Care Manager  01/06/2020   MARCELYN PULLAR 10-18-1941 [id number]   Covering for Assigned Care Coordinator Jon Billings [facility]  Subjective:  Successful outreach call to patient , introduced explained reason for the call . Patient states that she is doing okay about the same . She reports that her daily weights are staying about the same 162, no sudden weight changes,she does have some shortness of breath with activity, no increase in swelling at ankles. She discussed continuing to limit salt in diet, states she has a good appetite.  She states that she will begin getting meals delivered to home every 2 weeks through her [employer], she will have family there today to bring box into home.  She reports that her blood pressure is doing good and reading today is 120/50, she denies dizziness, reports her PCP started her own a new medication Abilify. She discussed taking a lot of medications but pill packaging makes it easier to keep up with.  She discussed having left  hip problems  and has an appointment with orthopedic surgeon 1/27 for evaluation states she made need surgery.She continue to use walker for safety, and fall prevention discussed.  She discussed possibility that she made need to go to rehab afterwards but her granddaughter may stay at home with her. She denies any other concerns at this time.     Encounter Medications:  Outpatient Encounter Medications as of 01/06/2020  Medication Sig Note  . allopurinol (ZYLOPRIM) 100 MG tablet Take 100 mg by mouth daily.   Marland Kitchen aspirin EC 81 MG tablet Take 81 mg by mouth daily.   . Aspirin-Salicylamide-Caffeine (BC HEADACHE POWDER PO) Take 1 packet by mouth as needed (for headaches).   Marland Kitchen atorvastatin (LIPITOR) 10 MG tablet Take 1 tablet by mouth daily.   . citalopram (CELEXA) 40 MG tablet Take 40 mg by mouth at bedtime.   . [employer]S D3 1000 units capsule Take 1,000 Units by mouth daily.   .  cyclobenzaprine (FLEXERIL) 10 MG tablet Take 10 mg by mouth 3 (three) times daily as needed for muscle spasms.    . ferrous sulfate 325 (65 FE) MG tablet Take 325 mg by mouth daily with breakfast.   . furosemide (LASIX) 40 MG tablet Take 40 mg by mouth daily in the afternoon. Daily in the afternoon   . furosemide (LASIX) 80 MG tablet Take 1 tablet (80 mg total) by mouth daily.   Marland Kitchen guaiFENesin (MUCINEX) 600 MG 12 hr tablet Take 600-1,200 mg by mouth 2 (two) times daily as needed for to loosen phlegm.    . INCRUSE ELLIPTA 62.5 MCG/INH AEPB Inhale 1 puff into the lungs 2 (two) times daily.    . Ipratropium-Albuterol (COMBIVENT) 20-100 MCG/ACT AERS respimat Inhale 1 puff into the lungs every 6 (six) hours as needed for wheezing or shortness of breath (or to help expel mucous).    Marland Kitchen levothyroxine (SYNTHROID) 50 MCG tablet Take 1 tablet (50 mcg total) by mouth daily at 6 (six) AM.   . magnesium oxide (MAG-OX) 400 (241.3 Mg) MG tablet Take 1 tablet (400 mg total) by mouth 2 (two) times daily.   . Melatonin 10 MG TABS Take 30 mg by mouth at bedtime. 10/04/2019: 3 tablets = 30 mg  . metolazone (ZAROXOLYN) 2.5 MG tablet Take 2.5 mg by mouth 2 (two) times a week. Take on Tuesday and Thursday   . neomycin-polymyxin-hydrocortisone (CORTISPORIN) 3.5-10000-1 OTIC suspension Place 5 drops into  the right ear every morning.  10/04/2019: "Stop date" not yet known  . nitroGLYCERIN (NITROSTAT) 0.4 MG SL tablet Place 1 tablet (0.4 mg total) under the tongue every 5 (five) minutes as needed for chest pain. For chest pain (Patient not taking: Reported on 12/19/2019)   . omeprazole (PRILOSEC) 40 MG capsule Take 1 capsule (40 mg total) by mouth 2 (two) times daily.   . Oxycodone HCl 10 MG TABS Take 10 mg by mouth every 6 (six) hours as needed (FOR PAIN).    Marland Kitchen OXYGEN Inhale 4 L/min into the lungs as needed (DURING ALL TIMES OF EXERTION).    Marland Kitchen pilocarpine (PILOCAR) 1 % ophthalmic solution Place 1 drop into both eyes 2 (two)  times daily.   . potassium chloride (K-DUR,KLOR-CON) 10 MEQ tablet Take 2 tablets (20 mEq total) by mouth daily.   . pramipexole (MIRAPEX) 0.25 MG tablet Take 0.25 mg by mouth at bedtime.   . roflumilast (DALIRESP) 500 MCG TABS tablet Take 500 mcg by mouth every evening.    Marland Kitchen spironolactone (ALDACTONE) 25 MG tablet TAKE 1 TABLET BY MOUTH DAILY   . traZODone (DESYREL) 50 MG tablet Take 150 mg by mouth at bedtime.  10/04/2019: 3 tablets = 150 mg   No facility-administered encounter medications on file as of 01/06/2020.    Functional Status:  In your present state of health, do you have any difficulty performing the following activities: 10/11/2019 10/05/2019  Hearing? N N  Vision? N N  Difficulty concentrating or making decisions? N N  Walking or climbing stairs? Y Y  Comment patient has shortness of breath -  Dressing or bathing? N N  Doing errands, shopping? Y N  Comment family does shopping -  Conservation officer, nature and eating ? N -  Using the Toilet? N -  In the past six months, have you accidently leaked urine? Y -  Do you have problems with loss of bowel control? N -  Managing your Medications? Y -  Comment son helps with medications -  Managing your Finances? N -  Housekeeping or managing your Housekeeping? Y -  Comment family helps -  Some recent data might be hidden    Fall/Depression Screening: Fall Risk  10/11/2019 05/13/2018 12/07/2017  Falls in the past year? 1 Yes No  Comment - - -  Number falls in past yr: 1 1 -  Injury with Fall? 1 No -  Risk for fall due to : History of fall(s);Impaired balance/gait History of fall(s) -  Follow up Falls prevention discussed Falls evaluation completed -   PHQ 2/9 Scores 10/11/2019 10/12/2018 05/13/2018 11/04/2017 05/26/2017 03/24/2017 03/23/2017  PHQ - 2 Score 2 0 6 3 2 3 3   PHQ- 9 Score 17 - 20 6 5 10 10     Assessment:  She continues to manage chronic condition of heart failure, blood pressure readings improved. continues to use walker to  safety measures.  Plan:  Will update assigned RNCM for follow within next month.   [facility] CM Care Plan Problem One     Most Recent Value  Care Plan Problem One  Support Needs for Self- Health Management CHF  Role Documenting the Problem One  Care Management Telephonic Coordinator  [facility] CM Short Term Goal #1   Over the next 30 days, patient will demonstrate and/or verbalize understanding of self-health management for long term care of CHF.   [facility] CM Short Term Goal #1 Start Date  12/01/19  Interventions for Short Term Goal #1  RN reviewed current clinical condition , reviewing recent weight, blood pressure,. Reinforced continued monitoring , reinforced worsening symptoms to notify MD of .      Joylene Draft, RN, [place] Management Coordinator  [phone]- Mobile [phone]- [place]

## 2020-01-18 DIAGNOSIS — M25552 Pain in left hip: Secondary | ICD-10-CM | POA: Diagnosis not present

## 2020-01-18 DIAGNOSIS — M1612 Unilateral primary osteoarthritis, left hip: Secondary | ICD-10-CM | POA: Diagnosis not present

## 2020-01-21 DIAGNOSIS — I509 Heart failure, unspecified: Secondary | ICD-10-CM

## 2020-01-21 HISTORY — DX: Heart failure, unspecified: I50.9

## 2020-01-24 ENCOUNTER — Encounter: Payer: Self-pay | Admitting: Internal Medicine

## 2020-01-24 ENCOUNTER — Telehealth: Payer: Self-pay

## 2020-01-24 NOTE — Telephone Encounter (Signed)
   [facility] Pre-operative Risk Assessment    Request for surgical clearance:  1. What type of surgery is being performed? LEFT TOTAL HIP ARTHOPLASTY   2. When is this surgery scheduled? TBD   3. What type of clearance is required (medical clearance vs. Pharmacy clearance to hold med vs. Both)? BOTH  4. Are there any medications that need to be held prior to surgery and how long? ASA ?   5. Practice name and name of physician performing surgery? [facility]; DR. Rodman Key OLIN   6. What is your office phone number [phone]    7.   What is your office fax number [phone]  8.   Anesthesia type (None, local, MAC, general) ? NO LISTED   Jacinta Shoe 01/24/2020, 4:43 PM  _________________________________________________________________   (provider comments below)

## 2020-01-24 NOTE — Telephone Encounter (Signed)
Error

## 2020-01-25 NOTE — Telephone Encounter (Signed)
Tried to call patient but phone just kept ringing and I was unable to leave a message. Will try again later.

## 2020-01-25 NOTE — Telephone Encounter (Signed)
Our office received another clearance form today from [facility]. See notes from yesterday and today in regards to our pre op team trying to reach the pt. I called and left message for [facility], scheduler that we did receive clearance request yesterday and have been trying to reach the pt.

## 2020-01-25 NOTE — Telephone Encounter (Signed)
Tried to call patient again but was unable to reach her. Unable to leave message. Will try again tomorrow.

## 2020-01-26 NOTE — Telephone Encounter (Signed)
Hi. Dr. Harrington Challenger,  [name] has a left total hip arthroplasty scheduled for 02/07/2020. She has a history of CAD s/p CABG in 2004 with subsequent DES to left main in [id number], chronic diastolic CHF, carotid artery disease, PAD s/p SMA stenting in 2019, recurrent DVT/PE s/p IVC filter in 02/2018 not on anticoagulation due to GI bleed and prior large abdominal wall hematoma, hypertension, COPD on home O2, and cirrhosis. She was seen twice in December for CHF but volume status had improved with addition of Metolazone twice a week. I called and spoke with her today. She states that she is doing well from a heart standpoint but her hip is really bothering her. She has chronic dyspnea on exertion and orthopnea but states this is not worse than usual and breathing is at baseline. Occasional PND but none recently. No lower extremity edema currently. No chest pain, palpitations, lightheadedness, dizziness, or syncope. It is very difficult to assess her true functional status as she is very limited with her hip pain and has to ambulate with a walker. Per Revised Cardiac Risk Index, she is considered moderate risk. Therefore, do you think she should have Myoview before surgery or is it OK for her to proceed. Also, is it OK for her to hold Aspirin for surgery?  Please route response back to P CV DIV PREOP.  Thank you! Aliene Tamura

## 2020-01-26 NOTE — Patient Instructions (Addendum)
DUE TO COVID-19 ONLY ONE VISITOR IS ALLOWED TO COME WITH YOU AND STAY IN THE WAITING ROOM ONLY DURING PRE OP AND PROCEDURE DAY OF SURGERY. THE 1 VISITOR MAY VISIT WITH YOU AFTER SURGERY IN YOUR PRIVATE ROOM DURING VISITING HOURS ONLY!  YOU NEED TO HAVE A COVID 19 TEST ON_2/12______ @_10 :10______, THIS TEST MUST BE DONE BEFORE SURGERY , COME  [name] , [id number].  ([place])  ONCE YOUR COVID TEST IS COMPLETED, PLEASE BEGIN THE QUARANTINE INSTRUCTIONS AS OUTLINED IN YOUR HANDOUT.                [name]    Your procedure is scheduled on: 02/07/20   Report to [facility] Main  Entrance   Report to admitting at  11:50 AM     Call this number if you have problems the morning of surgery [place], NO CHEWING GUM [place].   Do not eat food After Midnight.   YOU MAY HAVE CLEAR LIQUIDS FROM MIDNIGHT UNTIL 11:00 AM    CLEAR LIQUID DIET   Foods Allowed                                                                     Foods Excluded  Coffee and tea, regular and decaf                             liquids that you cannot  Plain Jell-O any favor except red or purple                                           see through such as: Fruit ices (not with fruit pulp)                                     milk, soups, orange juice  Iced Popsicles                                    All solid food Carbonated beverages, regular and diet                                    Cranberry, grape and apple juices Sports drinks like Gatorade Lightly seasoned clear broth or consume(fat free) Sugar, honey _____________________________________________________________________   . At 11:00 AM Please finish the prescribed Pre-Surgery  drink.   Nothing by mouth after you finish the  drink !    Take these medicines the morning of surgery with A SIP OF WATER: Gabapentin,Abilify,  Allopurinol, Levothyroxine, Prilosec, Roflumilast    Use you eye drops.  Use your inhalers and bring them with you to the hosital  You may not have any metal on your body including hair pins and              piercings  Do not wear jewelry, make-up, lotions, powders or perfumes, deodorant             Do not wear nail polish on your fingernails.  Do not shave  48 hours prior to surgery.               Do not bring valuables to the hospital. [place].  Contacts, dentures or bridgework may not be worn into surgery.      Patients discharged the day of surgery will not be allowed to drive home.  IF YOU ARE HAVING SURGERY AND GOING HOME THE SAME DAY, YOU MUST HAVE AN ADULT TO DRIVE YOU HOME AND BE WITH YOU FOR 24 HOURS . YOU MAY GO HOME BY TAXI OR UBER OR ORTHERWISE, BUT AN ADULT MUST ACCOMPANY YOU HOME AND STAY WITH YOU FOR 24 HOURS.  Name and phone number of your driver:  Special Instructions: N/A              Please read over the following fact sheets you were given: _____________________________________________________________________             [name] - Preparing for Surgery  Before surgery, you can play an important role.   Because skin is not sterile, your skin needs to be as free of germs as possible.   You can reduce the number of germs on your skin by washing with CHG (chlorahexidine gluconate) soap before surgery.   CHG is an antiseptic cleaner which kills germs and bonds with the skin to continue killing germs even after washing. Please DO NOT use if you have an allergy to CHG or antibacterial soaps.   If your skin becomes reddened/irritated stop using the CHG and inform your nurse when you arrive at Short Stay. Do not shave (including legs and underarms) for at least 48 hours prior to the first CHG shower.    Please follow these instructions carefully:  1.  Shower with CHG Soap the  night before surgery and the  morning of Surgery.  2.  If you choose to wash your hair, wash your hair first as usual with your  normal  shampoo.  3.  After you shampoo, rinse your hair and body thoroughly to remove the  shampoo.                                        4.  Use CHG as you would any other liquid soap.  You can apply chg directly  to the skin and wash                       Gently with a scrungie or clean washcloth.  5.  Apply the CHG Soap to your body ONLY FROM THE NECK DOWN.   Do not use on face/ open                           Wound or open sores. Avoid contact with eyes, ears mouth and genitals (private parts).  Wash face,  Genitals (private parts) with your normal soap.             6.  Wash thoroughly, paying special attention to the area where your surgery  will be performed.  7.  Thoroughly rinse your body with warm water from the neck down.  8.  DO NOT shower/wash with your normal soap after using and rinsing off  the CHG Soap.             9.  Pat yourself dry with a clean towel.            10.  Wear clean pajamas.            11.  Place clean sheets on your bed the night of your first shower and do not  sleep with pets. Day of Surgery : Do not apply any lotions/deodorants the morning of surgery.  Please wear clean clothes to the hospital/surgery center.  FAILURE TO FOLLOW THESE INSTRUCTIONS MAY RESULT IN THE CANCELLATION OF YOUR SURGERY PATIENT SIGNATURE_________________________________  NURSE SIGNATURE__________________________________  ________________________________________________________________________   [name]  An incentive spirometer is a tool that can help keep your lungs clear and active. This tool measures how well you are filling your lungs with each breath. Taking long deep breaths may help reverse or decrease the chance of developing breathing (pulmonary) problems (especially infection) following:  A long period of time  when you are unable to move or be active. BEFORE THE PROCEDURE   If the spirometer includes an indicator to show your best effort, your nurse or respiratory therapist will set it to a desired goal.  If possible, sit up straight or lean slightly forward. Try not to slouch.  Hold the incentive spirometer in an upright position. INSTRUCTIONS FOR USE  1. Sit on the edge of your bed if possible, or sit up as far as you can in bed or on a chair. 2. Hold the incentive spirometer in an upright position. 3. Breathe out normally. 4. Place the mouthpiece in your mouth and seal your lips tightly around it. 5. Breathe in slowly and as deeply as possible, raising the piston or the ball toward the top of the column. 6. Hold your breath for 3-5 seconds or for as long as possible. Allow the piston or ball to fall to the bottom of the column. 7. Remove the mouthpiece from your mouth and breathe out normally. 8. Rest for a few seconds and repeat Steps 1 through 7 at least 10 times every 1-2 hours when you are awake. Take your time and take a few normal breaths between deep breaths. 9. The spirometer may include an indicator to show your best effort. Use the indicator as a goal to work toward during each repetition. 10. After each set of 10 deep breaths, practice coughing to be sure your lungs are clear. If you have an incision (the cut made at the time of surgery), support your incision when coughing by placing a pillow or rolled up towels firmly against it. Once you are able to get out of bed, walk around indoors and cough well. You may stop using the incentive spirometer when instructed by your caregiver.  RISKS AND COMPLICATIONS  Take your time so you do not get dizzy or light-headed.  If you are in pain, you may need to take or ask for pain medication before doing incentive spirometry. It is harder to take a deep breath if you are having pain. AFTER USE  Rest and breathe slowly and easily.  It can be  helpful to keep track of a log of your progress. Your caregiver can provide you with a simple table to help with this. If you are using the spirometer at home, follow these instructions: [place] IF:   You are having difficultly using the spirometer.  You have trouble using the spirometer as often as instructed.  Your pain medication is not giving enough relief while using the spirometer.  You develop fever of 100.5 F (38.1 C) or higher. SEEK IMMEDIATE MEDICAL CARE IF:   You cough up bloody sputum that had not been present before.  You develop fever of 102 F (38.9 C) or greater.  You develop worsening pain at or near the incision site. MAKE SURE YOU:   Understand these instructions.  Will watch your condition.  Will get help right away if you are not doing well or get worse. Document Released: 04/20/2007 Document Revised: 03/01/2012 Document Reviewed: 06/21/2007 ExitCare Patient Information 2014 ExitCare, [place].   ________________________________________________________________________  WHAT IS A BLOOD TRANSFUSION? Blood Transfusion Information  A transfusion is the replacement of blood or some of its parts. Blood is made up of multiple cells which provide different functions.  Red blood cells carry oxygen and are used for blood loss replacement.  White blood cells fight against infection.  Platelets control bleeding.  Plasma helps clot blood.  Other blood products are available for specialized needs, such as hemophilia or other clotting disorders. BEFORE THE TRANSFUSION  Who gives blood for transfusions?   Healthy volunteers who are fully evaluated to make sure their blood is safe. This is blood bank blood. Transfusion therapy is the safest it has ever been in the practice of medicine. Before blood is taken from a donor, a complete history is taken to make sure that person has no history of diseases nor engages in risky social behavior (examples are  intravenous drug use or sexual activity with multiple partners). The donor's travel history is screened to minimize risk of transmitting infections, such as malaria. The donated blood is tested for signs of infectious diseases, such as HIV and hepatitis. The blood is then tested to be sure it is compatible with you in order to minimize the chance of a transfusion reaction. If you or a relative donates blood, this is often done in anticipation of surgery and is not appropriate for emergency situations. It takes many days to process the donated blood. RISKS AND COMPLICATIONS Although transfusion therapy is very safe and saves many lives, the main dangers of transfusion include:   Getting an infectious disease.  Developing a transfusion reaction. This is an allergic reaction to something in the blood you were given. Every precaution is taken to prevent this. The decision to have a blood transfusion has been considered carefully by your caregiver before blood is given. Blood is not given unless the benefits outweigh the risks. AFTER THE TRANSFUSION  Right after receiving a blood transfusion, you will usually feel much better and more energetic. This is especially true if your red blood cells have gotten low (anemic). The transfusion raises the level of the red blood cells which carry oxygen, and this usually causes an energy increase.  The nurse administering the transfusion will monitor you carefully for complications. HOME CARE INSTRUCTIONS  No special instructions are needed after a transfusion. You may find your energy is better. Speak with your caregiver about any limitations on activity for underlying diseases you may have.  SEEK MEDICAL CARE IF:   Your condition is not improving after your transfusion.  You develop redness or irritation at the intravenous (IV) site. SEEK IMMEDIATE MEDICAL CARE IF:  Any of the following symptoms occur over the next 12 hours:  Shaking chills.  You have a  temperature by mouth above 102 F (38.9 C), not controlled by medicine.  Chest, back, or muscle pain.  People around you feel you are not acting correctly or are confused.  Shortness of breath or difficulty breathing.  Dizziness and fainting.  You get a rash or develop hives.  You have a decrease in urine output.  Your urine turns a dark color or changes to pink, red, or brown. Any of the following symptoms occur over the next 10 days:  You have a temperature by mouth above 102 F (38.9 C), not controlled by medicine.  Shortness of breath.  Weakness after normal activity.  The white part of the eye turns yellow (jaundice).  You have a decrease in the amount of urine or are urinating less often.  Your urine turns a dark color or changes to pink, red, or brown. Document Released: 12/05/2000 Document Revised: 03/01/2012 Document Reviewed: 07/24/2008 [facility] Patient Information 2014 [place], [place].  _______________________________________________________________________

## 2020-01-27 ENCOUNTER — Encounter (HOSPITAL_COMMUNITY)
Admission: RE | Admit: 2020-01-27 | Discharge: 2020-01-27 | Disposition: A | Discharge status: Home / Self Care | Payer: Medicare Other | Source: Ambulatory Visit | Attending: Orthopedic Surgery | Admitting: Orthopedic Surgery

## 2020-01-27 ENCOUNTER — Encounter (HOSPITAL_COMMUNITY): Payer: Self-pay

## 2020-01-27 ENCOUNTER — Other Ambulatory Visit: Payer: Self-pay

## 2020-01-27 NOTE — Progress Notes (Signed)
PCP - Dr. [name] Cardiologist - Dr. Sheilah Mins  Chest x-ray - 12/23/18 EKG - 10/05/19 Stress Test - no ECHO - 10/05/19 Cardiac Cath - 2010  Sleep Study - yes CPAP - Pt lost Wt and no longer needs it  Fasting Blood Sugar - NA Checks Blood Sugar _____ times a day  Blood Thinner Instructions:ASA Aspirin Instructions:5 days prior. Dr. [name] Last Dose:02/02/20  Anesthesia review:   Patient denies shortness of breath, fever, cough and chest pain at PAT appointment yes  Patient verbalized understanding of instructions that were given to them at the PAT appointment. Patient was also instructed that they will need to review over the PAT instructions again at home before surgery. Yes Pt uses home O2.

## 2020-01-31 ENCOUNTER — Encounter (HOSPITAL_COMMUNITY)
Admission: RE | Admit: 2020-01-31 | Discharge: 2020-01-31 | Disposition: A | Discharge status: Home / Self Care | Payer: Medicare Other | Source: Ambulatory Visit | Attending: Orthopedic Surgery | Admitting: Orthopedic Surgery

## 2020-01-31 ENCOUNTER — Other Ambulatory Visit: Payer: Self-pay

## 2020-01-31 DIAGNOSIS — M109 Gout, unspecified: Secondary | ICD-10-CM | POA: Diagnosis not present

## 2020-01-31 DIAGNOSIS — G4730 Sleep apnea, unspecified: Secondary | ICD-10-CM | POA: Diagnosis not present

## 2020-01-31 DIAGNOSIS — I5032 Chronic diastolic (congestive) heart failure: Secondary | ICD-10-CM | POA: Diagnosis not present

## 2020-01-31 DIAGNOSIS — K219 Gastro-esophageal reflux disease without esophagitis: Secondary | ICD-10-CM | POA: Diagnosis not present

## 2020-01-31 DIAGNOSIS — Z86718 Personal history of other venous thrombosis and embolism: Secondary | ICD-10-CM | POA: Diagnosis not present

## 2020-01-31 DIAGNOSIS — N183 Chronic kidney disease, stage 3 unspecified: Secondary | ICD-10-CM | POA: Insufficient documentation

## 2020-01-31 DIAGNOSIS — Z7901 Long term (current) use of anticoagulants: Secondary | ICD-10-CM | POA: Insufficient documentation

## 2020-01-31 DIAGNOSIS — I130 Hypertensive heart and chronic kidney disease with heart failure and stage 1 through stage 4 chronic kidney disease, or unspecified chronic kidney disease: Secondary | ICD-10-CM | POA: Diagnosis not present

## 2020-01-31 DIAGNOSIS — Z79899 Other long term (current) drug therapy: Secondary | ICD-10-CM | POA: Insufficient documentation

## 2020-01-31 DIAGNOSIS — Z01812 Encounter for preprocedural laboratory examination: Secondary | ICD-10-CM | POA: Diagnosis not present

## 2020-01-31 DIAGNOSIS — E039 Hypothyroidism, unspecified: Secondary | ICD-10-CM | POA: Diagnosis not present

## 2020-01-31 DIAGNOSIS — J439 Emphysema, unspecified: Secondary | ICD-10-CM | POA: Diagnosis not present

## 2020-01-31 DIAGNOSIS — I2510 Atherosclerotic heart disease of native coronary artery without angina pectoris: Secondary | ICD-10-CM | POA: Insufficient documentation

## 2020-01-31 DIAGNOSIS — Z7982 Long term (current) use of aspirin: Secondary | ICD-10-CM | POA: Diagnosis not present

## 2020-01-31 DIAGNOSIS — M1612 Unilateral primary osteoarthritis, left hip: Secondary | ICD-10-CM | POA: Insufficient documentation

## 2020-01-31 DIAGNOSIS — D649 Anemia, unspecified: Secondary | ICD-10-CM | POA: Insufficient documentation

## 2020-01-31 LAB — CBC WITH DIFFERENTIAL/PLATELET
Abs Immature Granulocytes: 0.14 K/uL — ABNORMAL HIGH (ref 0.00–0.07)
Basophils Absolute: 0.1 K/uL (ref 0.0–0.1)
Basophils Relative: 1 %
Eosinophils Absolute: 0.2 K/uL (ref 0.0–0.5)
Eosinophils Relative: 2 %
HCT: 41.5 % (ref 36.0–46.0)
Hemoglobin: 13 g/dL (ref 12.0–15.0)
Immature Granulocytes: 2 %
Lymphocytes Relative: 20 %
Lymphs Abs: 1.8 K/uL (ref 0.7–4.0)
MCH: 29 pg (ref 26.0–34.0)
MCHC: 31.3 g/dL (ref 30.0–36.0)
MCV: 92.4 fL (ref 80.0–100.0)
Monocytes Absolute: 0.8 K/uL (ref 0.1–1.0)
Monocytes Relative: 8 %
Neutro Abs: 6.4 K/uL (ref 1.7–7.7)
Neutrophils Relative %: 67 %
Platelets: 228 K/uL (ref 150–400)
RBC: 4.49 MIL/uL (ref 3.87–5.11)
RDW: 15.9 % — ABNORMAL HIGH (ref 11.5–15.5)
WBC: 9.4 K/uL (ref 4.0–10.5)
nRBC: 0 % (ref 0.0–0.2)

## 2020-01-31 LAB — COMPREHENSIVE METABOLIC PANEL WITH GFR
ALT: 12 U/L (ref 0–44)
AST: 18 U/L (ref 15–41)
Albumin: 3.8 g/dL (ref 3.5–5.0)
Alkaline Phosphatase: 78 U/L (ref 38–126)
Anion gap: 10 (ref 5–15)
BUN: 39 mg/dL — ABNORMAL HIGH (ref 8–23)
CO2: 32 mmol/L (ref 22–32)
Calcium: 9.3 mg/dL (ref 8.9–10.3)
Chloride: 97 mmol/L — ABNORMAL LOW (ref 98–111)
Creatinine, Ser: 1.73 mg/dL — ABNORMAL HIGH (ref 0.44–1.00)
GFR calc Af Amer: 32 mL/min — ABNORMAL LOW (ref >60)
GFR calc non Af Amer: 28 mL/min — ABNORMAL LOW (ref >60)
Glucose, Bld: 115 mg/dL — ABNORMAL HIGH (ref 70–99)
Potassium: 3.6 mmol/L (ref 3.5–5.1)
Sodium: 139 mmol/L (ref 135–145)
Total Bilirubin: 0.4 mg/dL (ref 0.3–1.2)
Total Protein: 7.3 g/dL (ref 6.5–8.1)

## 2020-01-31 LAB — PROTIME-INR
INR: 1 (ref 0.8–1.2)
Prothrombin Time: 13.5 s (ref 11.4–15.2)

## 2020-01-31 LAB — SURGICAL PCR SCREEN
MRSA, PCR: NEGATIVE
Staphylococcus aureus: NEGATIVE

## 2020-01-31 NOTE — Telephone Encounter (Signed)
   Primary Cardiologist: Dorris Carnes, MD  Chart reviewed as part of pre-operative protocol coverage. Patient was last seen by [name], PA-C, on 12/14/2019 at which time she was stable from a cardiac standpoint. I called and spoke with patient on 01/26/2020 at which time patient reported that she was doing well from a cardiac standpoint but her hip was really bothering her. She has chronic dyspnea on exertion and orthopnea but states this is not worse than usual and breathing is at baseline. Occasional PND but none recently. No lower extremity edema. No chest pain, palpitations, lightheadedness, dizziness, or syncope. It is very difficult to assess her true functional status as she is very limited with her hip pain and has to ambulate with a walker. Per Revised Cardiac Risk Index, considered moderate risk (although this does not take into account patient's age).  Discussed with Dr. Harrington Challenger who feels patient is OK to proceed with hip surgery without any further cardiovascular testing given low risk Myoview in 08/2017 and normal LV function on Echo in 09/2019. She did recommend monitoring patient closely on telemetry following surgery and watching BP closely as well as transfusing as needed for hemoglobin less than 8.  Regarding ASA therapy, we typically recommend continuation of ASA throughout the perioperative period.  However, if the surgeon feels that cessation of ASA is required in the perioperative period, it may be stopped 5-7 days prior to surgery with a plan to resume it as soon as felt to be feasible from a surgical standpoint in the post-operative period.  I will route this recommendation to the requesting party via Epic fax function and remove from pre-op pool.  Please call with questions.  Darreld Mclean, PA-C 01/31/2020, 10:11 AM

## 2020-01-31 NOTE — Telephone Encounter (Signed)
[name]- I saw she had a myovue in 2018 that was low risk   NO change in symptoms  No CP LVEF was normal in October It think she is OK to proceed with close f/u on tele after.   Watch BP   Transfuse as needed for Hgb less than 8

## 2020-02-01 LAB — ABO/RH: ABO/RH(D): O POS

## 2020-02-02 NOTE — Progress Notes (Signed)
Anesthesia Chart Review   Case: [id number] Date/Time: 02/07/20 1405   Procedure: TOTAL HIP ARTHROPLASTY ANTERIOR APPROACH (Left Hip) - 70 mins   Anesthesia type: Spinal   Pre-op diagnosis: Left hip osteoarthritis   Location: [place] [facility] / [facility]   Surgeons: Paralee Cancel, MD      DISCUSSION:78 y.o. former smoker (45 pack years, quit 12/22/00) with h/o HTN, COPD (uses 4L home O2 as needed), CKD Stage III (creatinine stable), HLD, hyperthyroidism, CAD (CABG 2004, DES to LM 2010), PAD s/p SMA stenting in 2019, recurrent DVT/PE s/p IVC filter in 02/2018 not on anticoagulation due to GI bleed and prior large abdominal wall hematoma, CHF, GERD, OSA, left hip OA scheduled for above procedure 02/07/20 with Dr. Paralee Cancel.   Pt cleared by cardiology 01/31/2020.  Per OV note, "Patient was last seen by [name], PA-C, on 12/14/2019 at which time she was stable from a cardiac standpoint. I called and spoke with patient on 01/26/2020 at which time patient reported that she was doing well from a cardiac standpoint but her hip was really bothering her. She has chronic dyspnea on exertion and orthopnea but states this is not worse than usual and breathing is at baseline. Occasional PND but none recently. No lower extremity edema. No chest pain, palpitations, lightheadedness, dizziness, or syncope. It is very difficult to assess her true functional status as she is very limited with her hip pain and has to ambulate with a walker. Per Revised Cardiac Risk Index, considered moderate risk (although this does not take into account patient's age). Discussed with Dr. Harrington Challenger who feels patient is OK to proceed with hip surgery without any further cardiovascular testing given low risk Myoview in 08/2017 and normal LV function on Echo in 09/2019. She did recommend monitoring patient closely on telemetry following surgery and watching BP closely as well as transfusing as needed for hemoglobin less than 8. Regarding ASA therapy, we  typically recommend continuation of ASA throughout the perioperative period.  However, if the surgeon feels that cessation of ASA is required in the perioperative period, it may be stopped 5-7 days prior to surgery with a plan to resume it as soon as felt to be feasible from a surgical standpoint in the post-operative period."  Last seen by PCP 12/29/2019, stable overall.    Anticipate pt can proceed with planned procedure barring acute status change.   VS: BP (!) 123/57   Pulse 80   Temp 36.8 C (Oral)   Resp 20   Ht 5\' 8"  (1.727 m)   Wt 74.1 kg   SpO2 94%   BMI 24.83 kg/m   PROVIDERS: [name], MD is PCP   Dorris Carnes, MD is Cardiologist  LABS: Labs reviewed: Acceptable for surgery. (all labs ordered are listed, but only abnormal results are displayed)  Labs Reviewed  CBC WITH DIFFERENTIAL/PLATELET - Abnormal; Notable for the following components:      Result Value   RDW 15.9 (*)    Abs Immature Granulocytes 0.14 (*)    All other components within normal limits  COMPREHENSIVE METABOLIC PANEL - Abnormal; Notable for the following components:   Chloride 97 (*)    Glucose, Bld 115 (*)    BUN 39 (*)    Creatinine, Ser 1.73 (*)    GFR calc non Af Amer 28 (*)    GFR calc Af Amer 32 (*)    All other components within normal limits  SURGICAL PCR SCREEN  PROTIME-INR  TYPE  AND SCREEN  ABO/RH     IMAGES:   EKG: 10/05/2019 Rate 72 bpm  Sinus rhythm with 1st degree A-V block with Premature supraventricular complexes Left axis deviation Left ventricular hypertrophy with QRS widening ( R in aVL , Cornell product )  CV: Echo 10/05/2019 IMPRESSIONS   1. Left ventricular ejection fraction, by visual estimation, is 55 to  60%. The left ventricle has normal function. There is mildly increased  left ventricular hypertrophy.  2. Elevated mean left atrial pressure.  3. Left ventricular diastolic Doppler parameters are consistent with  pseudonormalization pattern of LV  diastolic filling.  4. Global right ventricle has normal systolic function.The right  ventricular size is normal. No increase in right ventricular wall  thickness.  5. Left atrial size was moderately dilated.  6. Right atrial size was moderately dilated.  7. Moderate mitral annular calcification.  8. The mitral valve is degenerative. Mild mitral valve regurgitation.  9. The tricuspid valve is normal in structure. Tricuspid valve  regurgitation is trivial.  10. The aortic valve is normal in structure. Aortic valve regurgitation  was not visualized by color flow Doppler.  11. The pulmonic valve was grossly normal. Pulmonic valve regurgitation is  not visualized by color flow Doppler.  12. Mildly elevated pulmonary artery systolic pressure.  13. The inferior vena cava is dilated in size with <50% respiratory  variability, suggesting right atrial pressure of 15 mmHg.   Myocardial Perfusion 08/31/2017  Nuclear stress EF: 65%.  Small region of distal anterior and apical ischemia, cannot exclude shifting breast attenuation. . Small anteroseptal defect consistent with probable soft tissue attenuation  This is a low risk study. Past Medical History:  Diagnosis Date  . Anemia    bld. transfusion post lumbar surgery- 2012  . Anxiety   . Arthralgia    NOS  . Blood transfusion 2012; 02/2018   WITH BACK SURGERY; "related to hematoma in stomach" (10/06/2018)  . CAD (coronary artery disease)    s/p CABG 2004; s/p DES to LM in 2010;  [place] 10/29/11: EF 50-55%, mild elevated filling pressures, no pulmonary HTN, LM 90% ISR, LAD and CFX occluded, S-RI occluded (old), S-OM3 ok and L-LAD ok, native nondominant RCA 95% -  med rx recommended ; Lexiscan Myoview 7/13 at [facility]: demonstrated "normal LV function, anterior attenuation and localized ischemia, inferior, basilar, mid section"  . Carotid artery disease ([place])    Carotid US [id number]:  RICA [id number]; LICA [id number]; R subclavian stenosis - Repeat 1  year. // Carotid US 05/2019: R 1-39; L 40-59; R vertebral with atypical antegrade flow; R subclavian stenosis >> repeat 1 year  . CHF (congestive heart failure) ([place]) 01/21/2020  . Chronic diastolic heart failure (HCC)    Echo 9/10: EF [id number], grade 1 diastolic dysfunction  . Chronic lower back pain   . COPD (chronic obstructive pulmonary disease) ([employer])    Emphysema dxed by Dr. Woody Seller in [place] based on PFTs per pt in 2006; placed on albuterol  . Depression    TAKES CELEXA  AND  (OFF- WELBUTRIN)  . Diuretic-induced hypokalemia 10/08/2018  . DVT of lower extremity (deep venous thrombosis) (HCC)    recurrent. bilateral (2 episodes)  . Dyspnea    home o2 when needed  . Dysrhythmia    afib with cabg  . Exertional angina (HCC)    Treated with Isosorbide, Ranexa, amlodipine; intolerant to metoprolol  . GERD (gastroesophageal reflux disease)   . Gout    "on daily RX" (10/06/2018)  .  HLD (hyperlipidemia)   . Hypertension   . Hyperthyroidism   . Obesity (BMI 30-39.9) 2009   BMI 33  . Osteoarthritis    "all over; hands" (10/06/2018)  . Oxygen deficiency   . Oxygen dependent    4L at home as needed (10/06/2018)  . Pneumonia   . PVD (peripheral vascular disease) ([place])   . Sleep apnea 2012   USED CPAP THEN  STARTED USING SPIRIVA , Nov. 2013- last evaluation , changed from mask to aparatus that is just for her nose.. ; reports 05-28-18 "the mask smothers me so i dont use it right now" (10/06/2018)    Past Surgical History:  Procedure Laterality Date  . ANKLE SURGERY  2004  . ANTERIOR CERVICAL DECOMP/DISCECTOMY FUSION  11/2007  . AUGMENTATION MAMMAPLASTY    . [place]   lower; another scheduled, opt. reports 4 back- lumbar, 3 cerv. fusions  for later 2009  . CARDIAC CATHETERIZATION  11/2009   Patent LIMA to LAD and patent SVG to OM1. Occluded SVG to ramus and diagonal. Left main: 90% ostial stenosis, LCX 60-70% proximal stenosis  . CATARACT EXTRACTION W/ INTRAOCULAR LENS   IMPLANT, BILATERAL Bilateral 2006  . COLONOSCOPY WITH PROPOFOL N/A 02/17/2017   Procedure: COLONOSCOPY WITH PROPOFOL;  Surgeon: Jerene Bears, MD;  [facility] END[facility];  Service: Endoscopy;  Laterality: N/A;  . CORONARY ANGIOPLASTY WITH STENT PLACEMENT  11/2009   Drug eluting stent to left main artery: 4.0 X 12 mm Ion   . CORONARY ARTERY BYPASS GRAFT  2004   "CABG X4"  . ESOPHAGOGASTRODUODENOSCOPY N/A 02/14/2017   Procedure: ESOPHAGOGASTRODUODENOSCOPY (EGD);  Surgeon: Doran Stabler, MD;  Location: [facility] ENDOSCOPY;  Service: Endoscopy;  Laterality: N/A;  . GIVENS CAPSULE STUDY N/A 02/15/2017   Procedure: GIVENS CAPSULE STUDY;  Surgeon: Doran Stabler, MD;  Location: [place];  Service: Endoscopy;  Laterality: N/A;  . GROIN MASS OPEN BIOPSY  2004  . IR IVC FILTER PLMT / S&I /IMG GUID/MOD SED  03/14/2018  . IR PARACENTESIS  03/10/2018  . JOINT REPLACEMENT    . LAPAROSCOPIC CHOLECYSTECTOMY    . PERIPHERAL VASCULAR INTERVENTION Left 03/05/2018   Procedure: PERIPHERAL VASCULAR INTERVENTION;  Surgeon: Elam Dutch, MD;  Location: [place] CV LAB;  Service: Cardiovascular;  Laterality: Left;  Attempted unsuccess\ful Per Dr. Eden Lathe  . PERIPHERAL VASCULAR INTERVENTION  03/08/2018   Procedure: PERIPHERAL VASCULAR INTERVENTION;  Surgeon: Waynetta Sandy, MD;  Location: [place] CV LAB;  Service: Cardiovascular;;  SMA Stent   . REPLACEMENT TOTAL KNEE BILATERAL Bilateral 2012  . REVERSE SHOULDER ARTHROPLASTY  02/26/2012   Procedure: REVERSE SHOULDER ARTHROPLASTY;  Surgeon: Marin Shutter, MD;  Location: [place];  Service: Orthopedics;  Laterality: Right;  RIGHT SHOULDER REVERSED ARTHROPLASTY  . SHOULDER ARTHROSCOPY WITH SUBACROMIAL DECOMPRESSION Left 02/10/2013   Procedure: SHOULDER ARTHROSCOPY WITH SUBACROMIAL DECOMPRESSION DISTAL CLAVICLE RESECTION;  Surgeon: Marin Shutter, MD;  Location: [place];  Service: Orthopedics;  Laterality: Left;  DISTAL CLAVICLE RESECTION  . TONSILLECTOMY     . TOTAL HIP ARTHROPLASTY  2007   Right  . TRANSURETHRAL RESECTION OF BLADDER TUMOR N/A 05/31/2018   Procedure: TRANSURETHRAL RESECTION OF BLADDER TUMOR (TURBT);  Surgeon: Lucas Mallow, MD;  Location[facility];  Service: Urology;  Laterality: N/A;  . VISCERAL ANGIOGRAPHY N/A 03/05/2018   Procedure: VISCERAL ANGIOGRAPHY;  Surgeon: Elam Dutch, MD;  Location: [place] [facility] LAB;  Service: Cardiovascular;  Laterality: N/A;  . VISCERAL  ANGIOGRAPHY N/A 03/08/2018   Procedure: VISCERAL ANGIOGRAPHY;  Surgeon: Waynetta Sandy, MD;  Location: [place] [facility];  Service: Cardiovascular;  Laterality: N/A;    MEDICATIONS: . allopurinol (ZYLOPRIM) 100 MG tablet  . ARIPiprazole (ABILIFY) 2 MG tablet  . ascorbic acid (VITAMIN C) 500 MG tablet  . aspirin EC 81 MG tablet  . atorvastatin (LIPITOR) 10 MG tablet  . citalopram (CELEXA) 40 MG tablet  . cyclobenzaprine (FLEXERIL) 10 MG tablet  . ferrous sulfate 325 (65 FE) MG tablet  . furosemide (LASIX) 40 MG tablet  . furosemide (LASIX) 80 MG tablet  . gabapentin (NEURONTIN) 100 MG capsule  . guaiFENesin (MUCINEX) 600 MG 12 hr tablet  . INCRUSE ELLIPTA 62.5 MCG/INH AEPB  . Ipratropium-Albuterol (COMBIVENT) 20-100 MCG/ACT AERS respimat  . levothyroxine (SYNTHROID) 50 MCG tablet  . magnesium oxide (MAG-OX) 400 (241.3 Mg) MG tablet  . Melatonin 10 MG TABS  . neomycin-polymyxin-hydrocortisone (CORTISPORIN) 3.5-10000-1 OTIC suspension  . nitroGLYCERIN (NITROSTAT) 0.4 MG SL tablet  . omeprazole (PRILOSEC) 40 MG capsule  . Oxycodone HCl 10 MG TABS  . OXYGEN  . pilocarpine (PILOCAR) 1 % ophthalmic solution  . potassium chloride (K-DUR,KLOR-CON) 10 MEQ tablet  . roflumilast (DALIRESP) 500 MCG TABS tablet  . spironolactone (ALDACTONE) 25 MG tablet  . traZODone (DESYREL) 50 MG tablet   No current facility-administered medications for this encounter.     Maia Plan [facility] Pre-Surgical Testing [phone] 02/02/20  12:34 PM

## 2020-02-03 ENCOUNTER — Other Ambulatory Visit (HOSPITAL_COMMUNITY)
Admission: RE | Admit: 2020-02-03 | Discharge: 2020-02-03 | Disposition: A | Discharge status: Home / Self Care | Payer: Medicare Other | Source: Ambulatory Visit | Attending: Orthopedic Surgery | Admitting: Orthopedic Surgery

## 2020-02-03 DIAGNOSIS — Z20822 Contact with and (suspected) exposure to covid-19: Secondary | ICD-10-CM | POA: Diagnosis not present

## 2020-02-03 DIAGNOSIS — Z01812 Encounter for preprocedural laboratory examination: Secondary | ICD-10-CM | POA: Insufficient documentation

## 2020-02-03 LAB — SARS CORONAVIRUS 2 (TAT 6-24 HRS): SARS Coronavirus 2: NEGATIVE

## 2020-02-06 ENCOUNTER — Other Ambulatory Visit: Payer: Self-pay

## 2020-02-06 NOTE — Patient Outreach (Signed)
[name]) Care Management  [place]  02/06/2020   [name] Jun 19, 1941 [id number]  Subjective: Telephone call to patient. She reports she is doing fair but having right much pain from left hip.  Patient scheduled for hip replacement on tomorrow.  She states that she has hired someone for the daytime and that her granddaughter will be staying at night. Patient praying all goes well with surgery for her to return home. She does not want to go to a facility at all.  Discussed post-op with patient and that CM would be calling after she gets home.  Patient reports that her weight remains stable at 162 lbs.  Reviewed heart failure and maintenance of heart failure.  She verbalized understanding and voices no concerns.   Objective:   Encounter Medications:  Outpatient Encounter Medications as of 02/06/2020  Medication Sig Note  . allopurinol (ZYLOPRIM) 100 MG tablet Take 100 mg by mouth daily.   . ARIPiprazole (ABILIFY) 2 MG tablet Take 2 mg by mouth daily.   Marland Kitchen ascorbic acid (VITAMIN C) 500 MG tablet Take 500 mg by mouth daily.   Marland Kitchen aspirin EC 81 MG tablet Take 81 mg by mouth daily.   Marland Kitchen atorvastatin (LIPITOR) 10 MG tablet Take 10 mg by mouth daily.    . citalopram (CELEXA) 40 MG tablet Take 40 mg by mouth at bedtime.   . cyclobenzaprine (FLEXERIL) 10 MG tablet Take 10 mg by mouth 3 (three) times daily as needed for muscle spasms.    . ferrous sulfate 325 (65 FE) MG tablet Take 325 mg by mouth daily with breakfast.   . furosemide (LASIX) 40 MG tablet Take 40 mg by mouth daily in the afternoon.  01/26/2020: Pt taking both 40 mg tablet and the 80 mg tablet daily  . furosemide (LASIX) 80 MG tablet Take 1 tablet (80 mg total) by mouth daily. (Patient taking differently: Take 80 mg by mouth every morning. ) 01/26/2020: Pt taking both 40 mg tablet and 80 mg tablet daily  . gabapentin (NEURONTIN) 100 MG capsule Take 100 mg by mouth daily.   Marland Kitchen guaiFENesin (MUCINEX) 600 MG 12 hr  tablet Take 600 mg by mouth 2 (two) times daily.    . INCRUSE ELLIPTA 62.5 MCG/INH AEPB Inhale 1 puff into the lungs 2 (two) times daily.    . Ipratropium-Albuterol (COMBIVENT) 20-100 MCG/ACT AERS respimat Inhale 1 puff into the lungs every 6 (six) hours as needed for wheezing or shortness of breath (or to help expel mucous).    Marland Kitchen levothyroxine (SYNTHROID) 50 MCG tablet Take 1 tablet (50 mcg total) by mouth daily at 6 (six) AM.   . magnesium oxide (MAG-OX) 400 (241.3 Mg) MG tablet Take 1 tablet (400 mg total) by mouth 2 (two) times daily. (Patient taking differently: Take 400 mg by mouth daily. )   . Melatonin 10 MG TABS Take 10 mg by mouth at bedtime.    Marland Kitchen neomycin-polymyxin-hydrocortisone (CORTISPORIN) 3.5-10000-1 OTIC suspension Place 5 drops into the right ear daily as needed (ear pain).    . nitroGLYCERIN (NITROSTAT) 0.4 MG SL tablet Place 1 tablet (0.4 mg total) under the tongue every 5 (five) minutes as needed for chest pain. For chest pain   . omeprazole (PRILOSEC) 40 MG capsule Take 1 capsule (40 mg total) by mouth 2 (two) times daily. (Patient taking differently: Take 40 mg by mouth daily. )   . Oxycodone HCl 10 MG TABS Take 10 mg by mouth every 6 (  six) hours as needed (PAIN).    Marland Kitchen OXYGEN Inhale 4 L/min into the lungs as needed (DURING ALL TIMES OF EXERTION).    Marland Kitchen pilocarpine (PILOCAR) 1 % ophthalmic solution Place 1 drop into both eyes 2 (two) times daily.   . potassium chloride (K-DUR,KLOR-CON) 10 MEQ tablet Take 2 tablets (20 mEq total) by mouth daily.   . roflumilast (DALIRESP) 500 MCG TABS tablet Take 500 mcg by mouth every evening.    Marland Kitchen spironolactone (ALDACTONE) 25 MG tablet TAKE 1 TABLET BY MOUTH DAILY (Patient taking differently: Take 25 mg by mouth daily. )   . traZODone (DESYREL) 50 MG tablet Take 150 mg by mouth at bedtime.     No facility-administered encounter medications on file as of 02/06/2020.    Functional Status:  In your present state of health, do you have any  difficulty performing the following activities: 01/27/2020 10/11/2019  Hearing? N N  Vision? N N  Difficulty concentrating or making decisions? N N  Walking or climbing stairs? Y Y  Comment SOB patient has shortness of breath  Dressing or bathing? N N  Doing errands, shopping? N Y  Comment - family does Engineer, drilling and eating ? - N  Using the Toilet? - N  In the past six months, have you accidently leaked urine? - Y  Do you have problems with loss of bowel control? - N  Managing your Medications? - Y  Comment - son helps with medications  Managing your Finances? - N  Housekeeping or managing your Housekeeping? - Y  Comment - family helps  Some recent data might be hidden    Fall/Depression Screening: Fall Risk  10/11/2019 05/13/2018 12/07/2017  Falls in the past year? 1 Yes No  Comment - - -  Number falls in past yr: 1 1 -  Injury with Fall? 1 No -  Risk for fall due to : History of fall(s);Impaired balance/gait History of fall(s) -  Follow up Falls prevention discussed Falls evaluation completed -   PHQ 2/9 Scores 10/11/2019 10/12/2018 05/13/2018 11/04/2017 05/26/2017 03/24/2017 03/23/2017  PHQ - 2 Score 2 0 6 3 2 3 3   PHQ- 9 Score 17 - 20 6 5 10 10     Assessment: Patient preparing for hip surgery on 02-07-20.  Patient managing chronic illnesses.   Plan:  [facility] CM Care Plan Problem One     Most Recent Value  Care Plan Problem One  Support Needs for Self- Health Management CHF  Role Documenting the Problem One  Care Management Telephonic Coordinator  Care Plan for Problem One  Active  [facility] Long Term Goal   Over the next 90 days, patient will demonstrate and/or verbalize understanding of self-health management for long term care of CHF  [facility] Long Term Goal Start Date  02/06/20  Interventions for Problem One Long Term Goal  RN CM discussed patient overall status. Patient most recent weight 162 lbs.  Reviewed low salt diet and importance of weights.      RN CM will contact  patient as appropriate after hospitalization for surgery and patient agreeable.   [name], RN, MSN [place] Management Care Management Coordinator Direct Line [phone] Cell [phone] Toll Free: [phone]  Fax: [phone]

## 2020-02-06 NOTE — H&P (Signed)
TOTAL HIP ADMISSION H&P  Patient is admitted for left total hip arthroplasty, anterior approach.  Subjective:  Chief Complaint: Left hip primary OA / pain  HPI: [name], 79 y.o. female, has a history of pain and functional disability in the left hip(s) due to arthritis and patient has failed non-surgical conservative treatments for greater than 12 weeks to include NSAID's and/or analgesics, corticosteriod injections, use of assistive devices and activity modification.  Onset of symptoms was gradual starting 10 years ago with gradually worsening course since that time.The patient noted prior procedures of the hip to include arthroplasty on the right hip in [place] over 10 years ago.  Patient currently rates pain in the left hip at 9 out of 10 with activity. Patient has worsening of pain with activity and weight bearing, trendelenberg gait, pain that interfers with activities of daily living and pain with passive range of motion. Patient has evidence of periarticular osteophytes and joint space narrowing by imaging studies. This condition presents safety issues increasing the risk of falls.  There is no current active infection.  Risks, benefits and expectations were discussed with the patient.  Risks including but not limited to the risk of anesthesia, blood clots, nerve damage, blood vessel damage, failure of the prosthesis, infection and up to and including death.  Patient understand the risks, benefits and expectations and wishes to proceed with surgery.   PCP: [name], MD  D/C Plans:       Home  Post-op Meds:       No Rx given   Tranexamic Acid:      To be given - IV   Decadron:      Is to be given  FYI:      Xarelto then ASA (hx of PE)  Oxy 10 (on pre-op for year 10 qid)  DME:   Pt already has equipment  PT:   HEP  Pharmacy: [facility]    Patient Active Problem List   Diagnosis Date Noted  . History of pulmonary embolism 10/04/2019  . Dyspnea  12/24/2018  . Cellulitis 12/24/2018  . Otitis media 12/24/2018  . Diuretic-induced hypokalemia 10/08/2018  . Acute on chronic diastolic heart failure ([place]) 10/06/2018  . Hematuria 04/01/2018  . Acute lower UTI 04/01/2018  . Hematoma of abdominal wall, subsequent encounter 04/01/2018  . Chronic diastolic heart failure ([place]) 03/29/2018  . GERD without esophagitis 03/29/2018  . Chronic gout 03/29/2018  . Chronic bilateral low back pain without sciatica 03/29/2018  . Colitis 03/10/2018  . Ascites   . Pulmonary embolism ([place]) 03/01/2018  . 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[place]) 03/01/2018  . Acute on chronic respiratory failure with hypoxia ([place]) 03/01/2018  . Angiodysplasia of intestine   . Hematochezia   . Malnutrition of moderate degree 02/15/2017  . Blood loss anemia 02/13/2017  . Impingement syndrome of left shoulder 02/10/2013  . PAD (peripheral artery disease) ([place]) 11/17/2011  . Dyslipidemia 10/17/2011  . Coronary artery disease involving native coronary artery of native heart with angina pectoris ([place])   . Hypothyroidism 05/03/2008  . Essential hypertension 05/03/2008  . Sleep apnea 05/03/2008  . COPD (chronic obstructive pulmonary disease) ([place]) 04/12/2008   Past Medical History:  Diagnosis Date  . Anemia    bld. transfusion post lumbar surgery- 2012  . Anxiety   . Arthralgia    NOS  . Blood transfusion 2012; 02/2018   WITH BACK SURGERY; "related to hematoma in stomach" (10/06/2018)  . CAD (coronary artery disease)  s/p CABG 2004; s/p DES to LM in 2010;  [place] 10/29/11: EF 50-55%, mild elevated filling pressures, no pulmonary HTN, LM 90% ISR, LAD and CFX occluded, S-RI occluded (old), S-OM3 ok and L-LAD ok, native nondominant RCA 95% -  med rx recommended ; Lexiscan Myoview 7/13 at [facility]: demonstrated "normal LV function, anterior attenuation and localized ischemia, inferior, basilar, mid section"  . Carotid artery disease ([place])    Carotid US [id number]:  RICA [id number]; LICA  [id number]; R subclavian stenosis - Repeat 1 year. // Carotid US 05/2019: R 1-39; L 40-59; R vertebral with atypical antegrade flow; R subclavian stenosis >> repeat 1 year  . CHF (congestive heart failure) ([place]) 01/21/2020  . Chronic diastolic heart failure (HCC)    Echo 9/10: EF [id number], grade 1 diastolic dysfunction  . Chronic lower back pain   . COPD (chronic obstructive pulmonary disease) ([place])    Emphysema dxed by Dr. Woody Seller in [place] based on PFTs per pt in 2006; placed on albuterol  . Depression    TAKES CELEXA  AND  (OFF- WELBUTRIN)  . Diuretic-induced hypokalemia 10/08/2018  . DVT of lower extremity (deep venous thrombosis) (HCC)    recurrent. bilateral (2 episodes)  . Dyspnea    home o2 when needed  . Dysrhythmia    afib with cabg  . Exertional angina (HCC)    Treated with Isosorbide, Ranexa, amlodipine; intolerant to metoprolol  . GERD (gastroesophageal reflux disease)   . Gout    "on daily RX" (10/06/2018)  . HLD (hyperlipidemia)   . Hypertension   . Hyperthyroidism   . Obesity (BMI 30-39.9) 2009   BMI 33  . Osteoarthritis    "all over; hands" (10/06/2018)  . Oxygen deficiency   . Oxygen dependent    4L at home as needed (10/06/2018)  . Pneumonia   . PVD (peripheral vascular disease) ([place])   . Sleep apnea 2012   USED CPAP THEN  STARTED USING SPIRIVA , Nov. 2013- last evaluation , changed from mask to aparatus that is just for her nose.. ; reports 05-28-18 "the mask smothers me so i dont use it right now" (10/06/2018)    Past Surgical History:  Procedure Laterality Date  . ANKLE SURGERY  2004  . ANTERIOR CERVICAL DECOMP/DISCECTOMY FUSION  11/2007  . AUGMENTATION MAMMAPLASTY    . [place]   lower; another scheduled, opt. reports 4 back- lumbar, 3 cerv. fusions  for later 2009  . CARDIAC CATHETERIZATION  11/2009   Patent LIMA to LAD and patent SVG to OM1. Occluded SVG to ramus and diagonal. Left main: 90% ostial stenosis, LCX 60-70% proximal stenosis  . CATARACT  EXTRACTION W/ INTRAOCULAR LENS  IMPLANT, BILATERAL Bilateral 2006  . COLONOSCOPY WITH PROPOFOL N/A 02/17/2017   Procedure: COLONOSCOPY WITH PROPOFOL;  Surgeon: Jerene Bears, MD;  [facility];  Service: Endoscopy;  Laterality: N/A;  . CORONARY ANGIOPLASTY WITH STENT PLACEMENT  11/2009   Drug eluting stent to left main artery: 4.0 X 12 mm Ion   . CORONARY ARTERY BYPASS GRAFT  2004   "CABG X4"  . ESOPHAGOGASTRODUODENOSCOPY N/A 02/14/2017   Procedure: ESOPHAGOGASTRODUODENOSCOPY (EGD);  Surgeon: Doran Stabler, MD;  Location[facility] ENDOSCOPY;  Service: Endoscopy;  Laterality: N/A;  . GIVENS CAPSULE STUDY N/A 02/15/2017   Procedure: GIVENS CAPSULE STUDY;  Surgeon: Doran Stabler, MD;  Location: [facility];  Service: Endoscopy;  Laterality: N/A;  . GROIN MASS OPEN BIOPSY  2004  . IR IVC  FILTER PLMT / S&I Burke Keels GUID/MOD SED  03/14/2018  . IR PARACENTESIS  03/10/2018  . JOINT REPLACEMENT    . LAPAROSCOPIC CHOLECYSTECTOMY    . PERIPHERAL VASCULAR INTERVENTION Left 03/05/2018   Procedure: PERIPHERAL VASCULAR INTERVENTION;  Surgeon: Elam Dutch, MD;  Location: [facility] CV LAB;  Service: Cardiovascular;  Laterality: Left;  Attempted unsuccess\ful Per Dr. Eden Lathe  . PERIPHERAL VASCULAR INTERVENTION  03/08/2018   Procedure: PERIPHERAL VASCULAR INTERVENTION;  Surgeon: Waynetta Sandy, MD;  Location: [place] [facility];  Service: Cardiovascular;;  SMA Stent   . REPLACEMENT TOTAL KNEE BILATERAL Bilateral 2012  . REVERSE SHOULDER ARTHROPLASTY  02/26/2012   Procedure: REVERSE SHOULDER ARTHROPLASTY;  Surgeon: Marin Shutter, MD;  Location: [facility];  Service: Orthopedics;  Laterality: Right;  RIGHT SHOULDER REVERSED ARTHROPLASTY  . SHOULDER ARTHROSCOPY WITH SUBACROMIAL DECOMPRESSION Left 02/10/2013   Procedure: SHOULDER ARTHROSCOPY WITH SUBACROMIAL DECOMPRESSION DISTAL CLAVICLE RESECTION;  Surgeon: Marin Shutter, MD;  Location: [facility];  Service: Orthopedics;  Laterality: Left;  DISTAL CLAVICLE  RESECTION  . TONSILLECTOMY    . TOTAL HIP ARTHROPLASTY  2007   Right  . TRANSURETHRAL RESECTION OF BLADDER TUMOR N/A 05/31/2018   Procedure: TRANSURETHRAL RESECTION OF BLADDER TUMOR (TURBT);  Surgeon: Lucas Mallow, MD;  Location: [facility];  Service: Urology;  Laterality: N/A;  . VISCERAL ANGIOGRAPHY N/A 03/05/2018   Procedure: VISCERAL ANGIOGRAPHY;  Surgeon: Elam Dutch, MD;  Location: [facility];  Service: Cardiovascular;  Laterality: N/A;  . VISCERAL ANGIOGRAPHY N/A 03/08/2018   Procedure: VISCERAL ANGIOGRAPHY;  Surgeon: Waynetta Sandy, MD;  Location: [facility];  Service: Cardiovascular;  Laterality: N/A;    No current facility-administered medications for this encounter.   Current Outpatient Medications  Medication Sig Dispense Refill Last Dose  . allopurinol (ZYLOPRIM) 100 MG tablet Take 100 mg by mouth daily.     . ARIPiprazole (ABILIFY) 2 MG tablet Take 2 mg by mouth daily.     Marland Kitchen ascorbic acid (VITAMIN C) 500 MG tablet Take 500 mg by mouth daily.     Marland Kitchen aspirin EC 81 MG tablet Take 81 mg by mouth daily.     Marland Kitchen atorvastatin (LIPITOR) 10 MG tablet Take 10 mg by mouth daily.      . citalopram (CELEXA) 40 MG tablet Take 40 mg by mouth at bedtime.     . cyclobenzaprine (FLEXERIL) 10 MG tablet Take 10 mg by mouth 3 (three) times daily as needed for muscle spasms.      . ferrous sulfate 325 (65 FE) MG tablet Take 325 mg by mouth daily with breakfast.     . furosemide (LASIX) 40 MG tablet Take 40 mg by mouth daily in the afternoon.      . furosemide (LASIX) 80 MG tablet Take 1 tablet (80 mg total) by mouth daily. (Patient taking differently: Take 80 mg by mouth every morning. ) 90 tablet 3   . gabapentin (NEURONTIN) 100 MG capsule Take 100 mg by mouth daily.     Marland Kitchen guaiFENesin (MUCINEX) 600 MG 12 hr tablet Take 600 mg by mouth 2 (two) times daily.      . INCRUSE ELLIPTA 62.5 MCG/INH AEPB Inhale 1 puff into the lungs 2 (two) times daily.      .  Ipratropium-Albuterol (COMBIVENT) 20-100 MCG/ACT AERS respimat Inhale 1 puff into the lungs every 6 (six) hours as needed for wheezing or shortness of breath (or to help expel mucous).      Marland Kitchen  levothyroxine (SYNTHROID) 50 MCG tablet Take 1 tablet (50 mcg total) by mouth daily at 6 (six) AM. 90 tablet 3   . magnesium oxide (MAG-OX) 400 (241.3 Mg) MG tablet Take 1 tablet (400 mg total) by mouth 2 (two) times daily. (Patient taking differently: Take 400 mg by mouth daily. )     . Melatonin 10 MG TABS Take 10 mg by mouth at bedtime.      Marland Kitchen neomycin-polymyxin-hydrocortisone (CORTISPORIN) 3.5-10000-1 OTIC suspension Place 5 drops into the right ear daily as needed (ear pain).      . nitroGLYCERIN (NITROSTAT) 0.4 MG SL tablet Place 1 tablet (0.4 mg total) under the tongue every 5 (five) minutes as needed for chest pain. For chest pain 25 tablet 3   . omeprazole (PRILOSEC) 40 MG capsule Take 1 capsule (40 mg total) by mouth 2 (two) times daily. (Patient taking differently: Take 40 mg by mouth daily. ) 180 capsule 3   . Oxycodone HCl 10 MG TABS Take 10 mg by mouth every 6 (six) hours as needed (PAIN).      Marland Kitchen OXYGEN Inhale 4 L/min into the lungs as needed (DURING ALL TIMES OF EXERTION).      Marland Kitchen pilocarpine (PILOCAR) 1 % ophthalmic solution Place 1 drop into both eyes 2 (two) times daily.     . potassium chloride (K-DUR,KLOR-CON) 10 MEQ tablet Take 2 tablets (20 mEq total) by mouth daily. 60 tablet 5   . roflumilast (DALIRESP) 500 MCG TABS tablet Take 500 mcg by mouth every evening.      Marland Kitchen spironolactone (ALDACTONE) 25 MG tablet TAKE 1 TABLET BY MOUTH DAILY (Patient taking differently: Take 25 mg by mouth daily. ) 30 tablet 11   . traZODone (DESYREL) 50 MG tablet Take 150 mg by mouth at bedtime.       Allergies  Allergen Reactions  . Ativan [Lorazepam] Other (See Comments)    Confusion   . Pitavastatin Other (See Comments)    LIVALO: reaction not recalled  . Ropinirole Nausea Only and Other (See Comments)     Makes legs jump, also  . Zofran [Ondansetron Hcl] Nausea Only  . Zolpidem Tartrate Anxiety and Other (See Comments)    CONFUSION   . Penicillins Rash and Other (See Comments)    Tolerated cephalosporins in past Has patient had a PCN reaction causing immediate rash, facial/tongue/throat swelling, SOB or lightheadedness with hypotension: Yes Has patient had a PCN reaction causing severe rash involving mucus membranes or skin necrosis: No Has patient had a PCN reaction that required hospitalization: No Has patient had a PCN reaction occurring within the last 10 years: No If all of the above answers are "NO", then may proceed with Cephalosporin use.     Social History   Tobacco Use  . Smoking status: Former Smoker    Packs/day: 1.00    Years: 45.00    Pack years: 45.00    Types: Cigarettes    Quit date: 12/22/2000    Years since quitting: 19.1  . Smokeless tobacco: Never Used  Substance Use Topics  . Alcohol use: Never    Alcohol/week: 0.0 standard drinks    Family History  Problem Relation Age of Onset  . Ovarian cancer Mother   . Lung cancer Father   . COPD Father   . Heart disease Father   . Heart disease Sister   . Stomach cancer Sister   . Colitis Sister      Review of Systems  Constitutional: Negative.  HENT: Negative.   Eyes: Negative.   Respiratory: Negative.   Cardiovascular: Negative.   Gastrointestinal: Positive for constipation.  Genitourinary: Negative.   Musculoskeletal: Positive for joint pain.  Skin: Negative.   Neurological: Negative.   Endo/Heme/Allergies: Negative.   Psychiatric/Behavioral: Negative.      Objective:  Physical Exam  Constitutional: She is oriented to person, place, and time. She appears well-developed.  HENT:  Head: Normocephalic.  Eyes: Pupils are equal, round, and reactive to light.  Neck: No JVD present. No tracheal deviation present. No thyromegaly present.  Cardiovascular: Normal rate, regular rhythm and intact distal  pulses.  Respiratory: Effort normal and breath sounds normal. No respiratory distress. She has no wheezes.  GI: Soft. There is no abdominal tenderness. There is no guarding.  Musculoskeletal:     Cervical back: Neck supple.     Left hip: Tenderness and bony tenderness present. No swelling, deformity or lacerations. Decreased range of motion. Decreased strength.  Lymphadenopathy:    She has no cervical adenopathy.  Neurological: She is alert and oriented to person, place, and time. A sensory deficit (bilateral LE neuropathy) is present.  Skin: Skin is warm and dry.  Psychiatric: She has a normal mood and affect.      Labs:   Estimated body mass index is 24.83 kg/m as calculated from the following:   Height as of 01/31/20: 5\' 8"  (1.727 m).   Weight as of 01/31/20: 74.1 kg.   Imaging Review Plain radiographs demonstrate severe degenerative joint disease of the left hip. The bone quality appears to be good for age and reported activity level.      Assessment/Plan:  End stage arthritis, left hip  The patient history, physical examination, clinical judgement of the provider and imaging studies are consistent with end stage degenerative joint disease of the left hip and total hip arthroplasty is deemed medically necessary. The treatment options including medical management, injection therapy, arthroscopy and arthroplasty were discussed at length. The risks and benefits of total hip arthroplasty were presented and reviewed. The risks due to aseptic loosening, infection, stiffness, dislocation/subluxation,  thromboembolic complications and other imponderables were discussed.  The patient acknowledged the explanation, agreed to proceed with the plan and consent was signed. Patient is being admitted for inpatient treatment for surgery, pain control, PT, OT, prophylactic antibiotics, VTE prophylaxis, progressive ambulation and ADL's and discharge planning.The patient is planning to be discharged  home.   [place] Pugh Teresea Donley   PA-C  02/07/2020, 8:19 AM

## 2020-02-07 ENCOUNTER — Ambulatory Visit (HOSPITAL_COMMUNITY): Payer: Medicare Other | Admitting: Physician Assistant

## 2020-02-07 ENCOUNTER — Ambulatory Visit (HOSPITAL_COMMUNITY): Payer: Medicare Other | Admitting: Certified Registered"

## 2020-02-07 ENCOUNTER — Observation Stay (HOSPITAL_COMMUNITY)
Admission: RE | Admit: 2020-02-07 | Discharge: 2020-02-08 | Disposition: A | Discharge status: Home / Self Care | Payer: Medicare Other | Source: Other Acute Inpatient Hospital | Attending: Orthopedic Surgery | Admitting: Orthopedic Surgery

## 2020-02-07 ENCOUNTER — Encounter (HOSPITAL_COMMUNITY): Payer: Self-pay | Admitting: Orthopedic Surgery

## 2020-02-07 ENCOUNTER — Encounter (HOSPITAL_COMMUNITY)
Admission: RE | Disposition: A | Discharge status: Home / Self Care | Payer: Self-pay | Source: Other Acute Inpatient Hospital | Attending: Orthopedic Surgery

## 2020-02-07 ENCOUNTER — Other Ambulatory Visit: Payer: Self-pay

## 2020-02-07 ENCOUNTER — Ambulatory Visit (HOSPITAL_COMMUNITY): Payer: Medicare Other

## 2020-02-07 ENCOUNTER — Observation Stay (HOSPITAL_COMMUNITY): Payer: Medicare Other

## 2020-02-07 DIAGNOSIS — J439 Emphysema, unspecified: Secondary | ICD-10-CM | POA: Diagnosis not present

## 2020-02-07 DIAGNOSIS — Z951 Presence of aortocoronary bypass graft: Secondary | ICD-10-CM | POA: Insufficient documentation

## 2020-02-07 DIAGNOSIS — Z87891 Personal history of nicotine dependence: Secondary | ICD-10-CM | POA: Diagnosis not present

## 2020-02-07 DIAGNOSIS — Z86711 Personal history of pulmonary embolism: Secondary | ICD-10-CM | POA: Diagnosis not present

## 2020-02-07 DIAGNOSIS — M25552 Pain in left hip: Secondary | ICD-10-CM | POA: Diagnosis present

## 2020-02-07 DIAGNOSIS — E785 Hyperlipidemia, unspecified: Secondary | ICD-10-CM | POA: Insufficient documentation

## 2020-02-07 DIAGNOSIS — E039 Hypothyroidism, unspecified: Secondary | ICD-10-CM | POA: Diagnosis not present

## 2020-02-07 DIAGNOSIS — Z96611 Presence of right artificial shoulder joint: Secondary | ICD-10-CM | POA: Insufficient documentation

## 2020-02-07 DIAGNOSIS — I5033 Acute on chronic diastolic (congestive) heart failure: Secondary | ICD-10-CM | POA: Diagnosis not present

## 2020-02-07 DIAGNOSIS — Z471 Aftercare following joint replacement surgery: Secondary | ICD-10-CM | POA: Diagnosis not present

## 2020-02-07 DIAGNOSIS — D649 Anemia, unspecified: Secondary | ICD-10-CM | POA: Insufficient documentation

## 2020-02-07 DIAGNOSIS — Z7982 Long term (current) use of aspirin: Secondary | ICD-10-CM | POA: Insufficient documentation

## 2020-02-07 DIAGNOSIS — I110 Hypertensive heart disease with heart failure: Secondary | ICD-10-CM | POA: Diagnosis not present

## 2020-02-07 DIAGNOSIS — I10 Essential (primary) hypertension: Secondary | ICD-10-CM | POA: Diagnosis not present

## 2020-02-07 DIAGNOSIS — Z96641 Presence of right artificial hip joint: Secondary | ICD-10-CM | POA: Insufficient documentation

## 2020-02-07 DIAGNOSIS — M109 Gout, unspecified: Secondary | ICD-10-CM | POA: Insufficient documentation

## 2020-02-07 DIAGNOSIS — Z96649 Presence of unspecified artificial hip joint: Secondary | ICD-10-CM

## 2020-02-07 DIAGNOSIS — G4730 Sleep apnea, unspecified: Secondary | ICD-10-CM | POA: Diagnosis not present

## 2020-02-07 DIAGNOSIS — Z96642 Presence of left artificial hip joint: Secondary | ICD-10-CM

## 2020-02-07 DIAGNOSIS — Z79899 Other long term (current) drug therapy: Secondary | ICD-10-CM | POA: Insufficient documentation

## 2020-02-07 DIAGNOSIS — Z96653 Presence of artificial knee joint, bilateral: Secondary | ICD-10-CM | POA: Diagnosis not present

## 2020-02-07 DIAGNOSIS — J449 Chronic obstructive pulmonary disease, unspecified: Secondary | ICD-10-CM | POA: Diagnosis not present

## 2020-02-07 DIAGNOSIS — I739 Peripheral vascular disease, unspecified: Secondary | ICD-10-CM | POA: Insufficient documentation

## 2020-02-07 DIAGNOSIS — Z955 Presence of coronary angioplasty implant and graft: Secondary | ICD-10-CM | POA: Diagnosis not present

## 2020-02-07 DIAGNOSIS — I2510 Atherosclerotic heart disease of native coronary artery without angina pectoris: Secondary | ICD-10-CM | POA: Diagnosis not present

## 2020-02-07 DIAGNOSIS — F329 Major depressive disorder, single episode, unspecified: Secondary | ICD-10-CM | POA: Insufficient documentation

## 2020-02-07 DIAGNOSIS — Z86718 Personal history of other venous thrombosis and embolism: Secondary | ICD-10-CM | POA: Diagnosis not present

## 2020-02-07 DIAGNOSIS — M1612 Unilateral primary osteoarthritis, left hip: Principal | ICD-10-CM | POA: Insufficient documentation

## 2020-02-07 DIAGNOSIS — Z79890 Hormone replacement therapy (postmenopausal): Secondary | ICD-10-CM | POA: Insufficient documentation

## 2020-02-07 DIAGNOSIS — K219 Gastro-esophageal reflux disease without esophagitis: Secondary | ICD-10-CM | POA: Diagnosis not present

## 2020-02-07 DIAGNOSIS — Z419 Encounter for procedure for purposes other than remedying health state, unspecified: Secondary | ICD-10-CM

## 2020-02-07 HISTORY — PX: TOTAL HIP ARTHROPLASTY: SHX124

## 2020-02-07 LAB — GLUCOSE, CAPILLARY: Glucose-Capillary: 80 mg/dL (ref 70–99)

## 2020-02-07 LAB — TYPE AND SCREEN
ABO/RH(D): O POS
Antibody Screen: NEGATIVE

## 2020-02-07 SURGERY — ARTHROPLASTY, HIP, TOTAL, ANTERIOR APPROACH
Anesthesia: Spinal | Site: Hip | Laterality: Left

## 2020-02-07 MED ORDER — ROFLUMILAST 500 MCG PO TABS
500.0000 ug | ORAL_TABLET | Freq: Every evening | ORAL | Status: DC
  Administered 2020-02-07: 500 ug via ORAL
  Filled 2020-02-07 (×2): qty 1

## 2020-02-07 MED ORDER — LACTATED RINGERS IV SOLN: INTRAVENOUS | Status: DC

## 2020-02-07 MED ORDER — METOCLOPRAMIDE HCL 5 MG/ML IJ SOLN: 5.0000 mg | Freq: Three times a day (TID) | INTRAMUSCULAR | Status: DC | PRN

## 2020-02-07 MED ORDER — RIVAROXABAN 10 MG PO TABS
10.0000 mg | ORAL_TABLET | ORAL | Status: DC
  Administered 2020-02-08: 08:00:00 10 mg via ORAL
  Filled 2020-02-07: qty 1

## 2020-02-07 MED ORDER — FUROSEMIDE 40 MG PO TABS
40.0000 mg | ORAL_TABLET | Freq: Every day | ORAL | Status: DC
  Administered 2020-02-08: 14:00:00 40 mg via ORAL
  Filled 2020-02-07: qty 1

## 2020-02-07 MED ORDER — PROPOFOL 500 MG/50ML IV EMUL
INTRAVENOUS | Status: DC | PRN
  Administered 2020-02-07: 50 ug/kg/min via INTRAVENOUS

## 2020-02-07 MED ORDER — CEFAZOLIN SODIUM-DEXTROSE 2-4 GM/100ML-% IV SOLN
2.0000 g | INTRAVENOUS | Status: AC
  Administered 2020-02-07: 2 g via INTRAVENOUS
  Filled 2020-02-07: qty 100

## 2020-02-07 MED ORDER — FERROUS SULFATE 325 (65 FE) MG PO TABS
325.0000 mg | ORAL_TABLET | Freq: Three times a day (TID) | ORAL | Status: DC
  Administered 2020-02-08 (×2): 325 mg via ORAL
  Filled 2020-02-07 (×2): qty 1

## 2020-02-07 MED ORDER — FENTANYL CITRATE (PF) 100 MCG/2ML IJ SOLN
25.0000 ug | INTRAMUSCULAR | Status: DC | PRN
  Administered 2020-02-07 (×2): 50 ug via INTRAVENOUS

## 2020-02-07 MED ORDER — DEXAMETHASONE SODIUM PHOSPHATE 10 MG/ML IJ SOLN
10.0000 mg | Freq: Once | INTRAMUSCULAR | Status: AC
  Administered 2020-02-07: 10 mg via INTRAVENOUS

## 2020-02-07 MED ORDER — CEFAZOLIN SODIUM-DEXTROSE 2-4 GM/100ML-% IV SOLN
2.0000 g | Freq: Four times a day (QID) | INTRAVENOUS | Status: AC
  Administered 2020-02-07 – 2020-02-08 (×2): 2 g via INTRAVENOUS
  Filled 2020-02-07 (×4): qty 100

## 2020-02-07 MED ORDER — PHENOL 1.4 % MT LIQD: 1.0000 | OROMUCOSAL | Status: DC | PRN

## 2020-02-07 MED ORDER — METOCLOPRAMIDE HCL 5 MG PO TABS: 5.0000 mg | ORAL_TABLET | Freq: Three times a day (TID) | ORAL | Status: DC | PRN

## 2020-02-07 MED ORDER — IPRATROPIUM-ALBUTEROL 0.5-2.5 (3) MG/3ML IN SOLN
3.0000 mL | Freq: Four times a day (QID) | RESPIRATORY_TRACT | Status: DC | PRN
  Administered 2020-02-08: 3 mL via RESPIRATORY_TRACT
  Filled 2020-02-07: qty 3

## 2020-02-07 MED ORDER — NITROGLYCERIN 0.4 MG SL SUBL: 0.4000 mg | SUBLINGUAL_TABLET | SUBLINGUAL | Status: DC | PRN

## 2020-02-07 MED ORDER — FUROSEMIDE 40 MG PO TABS
80.0000 mg | ORAL_TABLET | Freq: Every morning | ORAL | Status: DC
  Administered 2020-02-08: 80 mg via ORAL
  Filled 2020-02-07: qty 2

## 2020-02-07 MED ORDER — METHOCARBAMOL 500 MG IVPB - SIMPLE MED
INTRAVENOUS | Status: AC
  Filled 2020-02-07: qty 50

## 2020-02-07 MED ORDER — OXYCODONE HCL 5 MG/5ML PO SOLN: 5.0000 mg | Freq: Once | ORAL | Status: DC | PRN

## 2020-02-07 MED ORDER — ARIPIPRAZOLE 2 MG PO TABS
2.0000 mg | ORAL_TABLET | Freq: Every day | ORAL | Status: DC
  Administered 2020-02-08: 09:00:00 2 mg via ORAL
  Filled 2020-02-07: qty 1

## 2020-02-07 MED ORDER — UMECLIDINIUM BROMIDE 62.5 MCG/INH IN AEPB
1.0000 | INHALATION_SPRAY | Freq: Two times a day (BID) | RESPIRATORY_TRACT | Status: DC
  Administered 2020-02-08: 1 via RESPIRATORY_TRACT
  Filled 2020-02-07: qty 7

## 2020-02-07 MED ORDER — CITALOPRAM HYDROBROMIDE 20 MG PO TABS
40.0000 mg | ORAL_TABLET | Freq: Every day | ORAL | Status: DC
  Administered 2020-02-07: 40 mg via ORAL
  Filled 2020-02-07: qty 2

## 2020-02-07 MED ORDER — ALUM & MAG HYDROXIDE-SIMETH 200-200-20 MG/5ML PO SUSP: 15.0000 mL | ORAL | Status: DC | PRN

## 2020-02-07 MED ORDER — ACETAMINOPHEN 500 MG PO TABS
1000.0000 mg | ORAL_TABLET | Freq: Four times a day (QID) | ORAL | Status: AC
  Administered 2020-02-07 – 2020-02-08 (×4): 1000 mg via ORAL
  Filled 2020-02-07 (×5): qty 2

## 2020-02-07 MED ORDER — PILOCARPINE HCL 1 % OP SOLN
1.0000 [drp] | Freq: Two times a day (BID) | OPHTHALMIC | Status: DC
  Administered 2020-02-07 – 2020-02-08 (×2): 1 [drp] via OPHTHALMIC
  Filled 2020-02-07: qty 15

## 2020-02-07 MED ORDER — PHENYLEPHRINE HCL-NACL 20-0.9 MG/250ML-% IV SOLN
INTRAVENOUS | Status: DC | PRN
  Administered 2020-02-07: 25 ug/min via INTRAVENOUS

## 2020-02-07 MED ORDER — DOCUSATE SODIUM 100 MG PO CAPS
100.0000 mg | ORAL_CAPSULE | Freq: Two times a day (BID) | ORAL | Status: DC
  Administered 2020-02-07 – 2020-02-08 (×2): 100 mg via ORAL
  Filled 2020-02-07 (×2): qty 1

## 2020-02-07 MED ORDER — GUAIFENESIN ER 600 MG PO TB12
600.0000 mg | ORAL_TABLET | Freq: Two times a day (BID) | ORAL | Status: DC
  Administered 2020-02-07 – 2020-02-08 (×2): 600 mg via ORAL
  Filled 2020-02-07 (×2): qty 1

## 2020-02-07 MED ORDER — LEVOTHYROXINE SODIUM 50 MCG PO TABS
50.0000 ug | ORAL_TABLET | Freq: Every day | ORAL | Status: DC
  Administered 2020-02-08: 50 ug via ORAL
  Filled 2020-02-07: qty 1

## 2020-02-07 MED ORDER — PANTOPRAZOLE SODIUM 40 MG PO TBEC
80.0000 mg | DELAYED_RELEASE_TABLET | Freq: Every day | ORAL | Status: DC
  Administered 2020-02-08: 80 mg via ORAL
  Filled 2020-02-07: qty 2

## 2020-02-07 MED ORDER — SODIUM CHLORIDE 0.9 % IR SOLN
Status: DC | PRN
  Administered 2020-02-07: 1000 mL

## 2020-02-07 MED ORDER — OXYCODONE HCL 5 MG PO TABS: 5.0000 mg | ORAL_TABLET | Freq: Once | ORAL | Status: DC | PRN

## 2020-02-07 MED ORDER — GABAPENTIN 100 MG PO CAPS
100.0000 mg | ORAL_CAPSULE | Freq: Every day | ORAL | Status: DC
  Administered 2020-02-08: 100 mg via ORAL
  Filled 2020-02-07: qty 1

## 2020-02-07 MED ORDER — MENTHOL 3 MG MT LOZG: 1.0000 | LOZENGE | OROMUCOSAL | Status: DC | PRN

## 2020-02-07 MED ORDER — TRANEXAMIC ACID-NACL 1000-0.7 MG/100ML-% IV SOLN
1000.0000 mg | INTRAVENOUS | Status: AC
  Administered 2020-02-07: 1000 mg via INTRAVENOUS
  Filled 2020-02-07: qty 100

## 2020-02-07 MED ORDER — ATORVASTATIN CALCIUM 10 MG PO TABS
10.0000 mg | ORAL_TABLET | Freq: Every day | ORAL | Status: DC
  Administered 2020-02-08: 10 mg via ORAL
  Filled 2020-02-07: qty 1

## 2020-02-07 MED ORDER — CHLORHEXIDINE GLUCONATE 4 % EX LIQD: 60.0000 mL | Freq: Once | CUTANEOUS | Status: DC

## 2020-02-07 MED ORDER — POVIDONE-IODINE 10 % EX SWAB
2.0000 "application " | Freq: Once | CUTANEOUS | Status: AC
  Administered 2020-02-07: 2 via TOPICAL

## 2020-02-07 MED ORDER — HYDROMORPHONE HCL 1 MG/ML IJ SOLN
0.5000 mg | INTRAMUSCULAR | Status: DC | PRN
  Administered 2020-02-07: 0.5 mg via INTRAVENOUS
  Administered 2020-02-08: 1 mg via INTRAVENOUS
  Filled 2020-02-07 (×2): qty 1

## 2020-02-07 MED ORDER — DEXAMETHASONE SODIUM PHOSPHATE 10 MG/ML IJ SOLN
10.0000 mg | Freq: Once | INTRAMUSCULAR | Status: AC
  Administered 2020-02-08: 10 mg via INTRAVENOUS
  Filled 2020-02-07: qty 1

## 2020-02-07 MED ORDER — MAGNESIUM CITRATE PO SOLN: 1.0000 | Freq: Once | ORAL | Status: DC | PRN

## 2020-02-07 MED ORDER — OXYCODONE HCL 5 MG PO TABS
20.0000 mg | ORAL_TABLET | ORAL | Status: DC | PRN
  Administered 2020-02-07 – 2020-02-08 (×4): 20 mg via ORAL
  Filled 2020-02-07 (×4): qty 4

## 2020-02-07 MED ORDER — FENTANYL CITRATE (PF) 100 MCG/2ML IJ SOLN
INTRAMUSCULAR | Status: AC
  Filled 2020-02-07: qty 2

## 2020-02-07 MED ORDER — BUPIVACAINE IN DEXTROSE 0.75-8.25 % IT SOLN
INTRATHECAL | Status: DC | PRN
  Administered 2020-02-07: 1.5 mL via INTRATHECAL

## 2020-02-07 MED ORDER — METHOCARBAMOL 500 MG PO TABS
500.0000 mg | ORAL_TABLET | Freq: Four times a day (QID) | ORAL | Status: DC | PRN
  Administered 2020-02-08: 500 mg via ORAL
  Filled 2020-02-07 (×2): qty 1

## 2020-02-07 MED ORDER — BISACODYL 10 MG RE SUPP: 10.0000 mg | Freq: Every day | RECTAL | Status: DC | PRN

## 2020-02-07 MED ORDER — SODIUM CHLORIDE 0.9 % IV SOLN: INTRAVENOUS | Status: DC

## 2020-02-07 MED ORDER — ALLOPURINOL 100 MG PO TABS
100.0000 mg | ORAL_TABLET | Freq: Every day | ORAL | Status: DC
  Administered 2020-02-08: 100 mg via ORAL
  Filled 2020-02-07: qty 1

## 2020-02-07 MED ORDER — POLYETHYLENE GLYCOL 3350 17 G PO PACK
17.0000 g | PACK | Freq: Two times a day (BID) | ORAL | Status: DC
  Administered 2020-02-07 – 2020-02-08 (×2): 17 g via ORAL
  Filled 2020-02-07 (×2): qty 1

## 2020-02-07 MED ORDER — DIPHENHYDRAMINE HCL 12.5 MG/5ML PO ELIX: 12.5000 mg | ORAL_SOLUTION | ORAL | Status: DC | PRN

## 2020-02-07 MED ORDER — METHOCARBAMOL 500 MG IVPB - SIMPLE MED
500.0000 mg | Freq: Four times a day (QID) | INTRAVENOUS | Status: DC | PRN
  Administered 2020-02-07: 500 mg via INTRAVENOUS
  Filled 2020-02-07: qty 50

## 2020-02-07 MED ORDER — OXYCODONE HCL 5 MG PO TABS: 10.0000 mg | ORAL_TABLET | ORAL | Status: DC | PRN

## 2020-02-07 MED ORDER — SPIRONOLACTONE 25 MG PO TABS
25.0000 mg | ORAL_TABLET | Freq: Every day | ORAL | Status: DC
  Administered 2020-02-08: 25 mg via ORAL
  Filled 2020-02-07: qty 1

## 2020-02-07 MED ORDER — POTASSIUM CHLORIDE CRYS ER 20 MEQ PO TBCR
20.0000 meq | EXTENDED_RELEASE_TABLET | Freq: Every day | ORAL | Status: DC
  Administered 2020-02-08: 09:00:00 20 meq via ORAL
  Filled 2020-02-07: qty 1

## 2020-02-07 MED ORDER — TRAZODONE HCL 50 MG PO TABS
150.0000 mg | ORAL_TABLET | Freq: Every day | ORAL | Status: DC
  Administered 2020-02-07: 150 mg via ORAL
  Filled 2020-02-07: qty 1

## 2020-02-07 MED ORDER — STERILE WATER FOR IRRIGATION IR SOLN
Status: DC | PRN
  Administered 2020-02-07: 2000 mL

## 2020-02-07 MED ORDER — PHENYLEPHRINE 40 MCG/ML (10ML) SYRINGE FOR IV PUSH (FOR BLOOD PRESSURE SUPPORT)
PREFILLED_SYRINGE | INTRAVENOUS | Status: DC | PRN
  Administered 2020-02-07 (×3): 40 ug via INTRAVENOUS

## 2020-02-07 SURGICAL SUPPLY — 50 items
ADH SKN CLS APL DERMABOND .7 (GAUZE/BANDAGES/DRESSINGS) ×1
ARTICULEZE HEAD (Hips) ×3 IMPLANT
BAG DECANTER FOR FLEXI CONT (MISCELLANEOUS) IMPLANT
BAG SPEC THK2 15X12 ZIP CLS (MISCELLANEOUS)
BAG ZIPLOCK 12X15 (MISCELLANEOUS) IMPLANT
BLADE SAG 18X100X1.27 (BLADE) ×3 IMPLANT
BLADE SURG SZ10 CARB STEEL (BLADE) ×6 IMPLANT
COVER PERINEAL POST (MISCELLANEOUS) ×3 IMPLANT
COVER SURGICAL LIGHT HANDLE (MISCELLANEOUS) ×3 IMPLANT
COVER WAND RF STERILE (DRAPES) IMPLANT
CUP ACETBLR 52 OD PINNACLE (Hips) ×2 IMPLANT
DERMABOND ADVANCED (GAUZE/BANDAGES/DRESSINGS) ×2
DERMABOND ADVANCED .7 DNX12 (GAUZE/BANDAGES/DRESSINGS) ×1 IMPLANT
DRAPE STERI IOBAN 125X83 (DRAPES) ×3 IMPLANT
DRAPE U-SHAPE 47X51 STRL (DRAPES) ×6 IMPLANT
DRESSING AQUACEL AG SP 3.5X10 (GAUZE/BANDAGES/DRESSINGS) ×1 IMPLANT
DRSG AQUACEL AG ADV 3.5X10 (GAUZE/BANDAGES/DRESSINGS) ×2 IMPLANT
DRSG AQUACEL AG SP 3.5X10 (GAUZE/BANDAGES/DRESSINGS) ×3
DURAPREP 26ML APPLICATOR (WOUND CARE) ×3 IMPLANT
ELECT BLADE TIP CTD 4 INCH (ELECTRODE) ×3 IMPLANT
ELECT REM PT RETURN 15FT ADLT (MISCELLANEOUS) ×3 IMPLANT
ELIMINATOR HOLE APEX DEPUY (Hips) ×2 IMPLANT
GLOVE BIO SURGEON STRL SZ 6 (GLOVE) ×6 IMPLANT
GLOVE BIOGEL PI IND STRL 6.5 (GLOVE) ×1 IMPLANT
GLOVE BIOGEL PI IND STRL 7.5 (GLOVE) ×1 IMPLANT
GLOVE BIOGEL PI IND STRL 8.5 (GLOVE) ×1 IMPLANT
GLOVE BIOGEL PI INDICATOR 6.5 (GLOVE) ×2
GLOVE BIOGEL PI INDICATOR 7.5 (GLOVE) ×2
GLOVE BIOGEL PI INDICATOR 8.5 (GLOVE) ×2
GLOVE ECLIPSE 8.0 STRL XLNG CF (GLOVE) ×6 IMPLANT
GLOVE ORTHO TXT STRL SZ7.5 (GLOVE) ×6 IMPLANT
GOWN STRL REUS W/TWL LRG LVL3 (GOWN DISPOSABLE) ×6 IMPLANT
GOWN STRL REUS W/TWL XL LVL3 (GOWN DISPOSABLE) ×3 IMPLANT
HEAD ARTICULEZE (Hips) IMPLANT
HOLDER FOLEY CATH W/STRAP (MISCELLANEOUS) ×3 IMPLANT
KIT TURNOVER KIT A (KITS) IMPLANT
LINER NEUTRAL 52X36MM PLUS 4 (Liner) ×2 IMPLANT
PACK ANTERIOR HIP CUSTOM (KITS) ×3 IMPLANT
PENCIL SMOKE EVACUATOR (MISCELLANEOUS) IMPLANT
SCREW 6.5MMX25MM (Screw) ×2 IMPLANT
STEM FEMORAL SZ 5MM STD ACTIS (Stem) ×2 IMPLANT
SUT MNCRL AB 4-0 PS2 18 (SUTURE) ×3 IMPLANT
SUT STRATAFIX 0 PDS 27 VIOLET (SUTURE) ×3
SUT VIC AB 1 CT1 36 (SUTURE) ×9 IMPLANT
SUT VIC AB 2-0 CT1 27 (SUTURE) ×6
SUT VIC AB 2-0 CT1 TAPERPNT 27 (SUTURE) ×2 IMPLANT
SUTURE STRATFX 0 PDS 27 VIOLET (SUTURE) ×1 IMPLANT
TRAY FOLEY MTR SLVR 16FR STAT (SET/KITS/TRAYS/PACK) IMPLANT
WATER STERILE IRR 1000ML POUR (IV SOLUTION) ×3 IMPLANT
YANKAUER SUCT BULB TIP 10FT TU (MISCELLANEOUS) IMPLANT

## 2020-02-07 NOTE — Op Note (Signed)
NAME:  [name]                ACCOUNT NO.: [id number]      MEDICAL RECORD NO.: [id number]      FACILITY:  [facility]      PHYSICIAN:  [name]  DATE OF BIRTH:  [birth year]     DATE OF PROCEDURE:  02/07/2020                                 OPERATIVE REPORT         PREOPERATIVE DIAGNOSIS: Left  hip osteoarthritis.      POSTOPERATIVE DIAGNOSIS:  Left hip osteoarthritis.      PROCEDURE:  Left total hip replacement through an anterior approach   utilizing DePuy THR system, component size 52 mm pinnacle cup, a size 36+4 neutral   Altrex liner, a size 5 standard Actis stem with a 36+5 Articuleze metal head ball.      SURGEON:  Pietro Cassis. Alvan Dame, M.D.      ASSISTANT:  Griffith Citron, PA-C     ANESTHESIA:  Spinal.      SPECIMENS:  None.      COMPLICATIONS:  None.      BLOOD LOSS:  300 cc     DRAINS:  None.      INDICATION OF THE PROCEDURE:  [name] is a 79 y.o. female who had   presented to office for evaluation of left hip pain.  Radiographs revealed   progressive degenerative changes with bone-on-bone   articulation of the  hip joint, including subchondral cystic changes and osteophytes.  The patient had painful limited range of   motion significantly affecting their overall quality of life and function.  The patient was failing to    respond to conservative measures including medications and/or injections and activity modification and at this point was ready   to proceed with more definitive measures.  Consent was obtained for   benefit of pain relief.  Specific risks of infection, DVT, component   failure, dislocation, neurovascular injury, and need for revision surgery were reviewed in the office as well discussion of   the anterior versus posterior approach were reviewed.     PROCEDURE IN DETAIL:  The patient was brought to operative theater.   Once adequate anesthesia, preoperative antibiotics, 2 gm of Ancef, 1 gm of Tranexamic Acid, and  10 mg of Decadron were administered, the patient was positioned supine on the [employer] table.  Once the patient was safely positioned with adequate padding of boney prominences we predraped out the hip, and used fluoroscopy to confirm orientation of the pelvis.      The left hip was then prepped and draped from proximal iliac crest to   mid thigh with a shower curtain technique.      Time-out was performed identifying the patient, planned procedure, and the appropriate extremity.     An incision was then made 2 cm lateral to the   anterior superior iliac spine extending over the orientation of the   tensor fascia lata muscle and sharp dissection was carried down to the   fascia of the muscle.      The fascia was then incised.  The muscle belly was identified and swept   laterally and retractor placed along the superior neck.  Following   cauterization of the circumflex vessels and removing some pericapsular  fat, a second cobra retractor was placed on the inferior neck.  A T-capsulotomy was made along the line of the   superior neck to the trochanteric fossa, then extended proximally and   distally.  Tag sutures were placed and the retractors were then placed   intracapsular.  We then identified the trochanteric fossa and   orientation of my neck cut and then made a neck osteotomy with the femur on traction.  The femoral   head was removed without difficulty or complication.  Traction was let   off and retractors were placed posterior and anterior around the   acetabulum.      The labrum and foveal tissue were debrided.  I began reaming with a 43 mm   reamer and reamed up to 51 mm reamer with good bony bed preparation and a 52 mm  cup was chosen.  The final 52 mm Pinnacle cup was then impacted under fluoroscopy to confirm the depth of penetration and orientation with respect to   Abduction and forward flexion.  A screw was placed into the ilium followed by the hole eliminator.  The final    36+4 neutral Altrex liner was impacted with good visualized rim fit.  The cup was positioned anatomically within the acetabular portion of the pelvis.      At this point, the femur was rolled to 100 degrees.  Further capsule was   released off the inferior aspect of the femoral neck.  I then   released the superior capsule proximally.  With the leg in a neutral position the hook was placed laterally   along the femur under the vastus lateralis origin and elevated manually and then held in position using the hook attachment on the bed.  The leg was then extended and adducted with the leg rolled to 100   degrees of external rotation.  Retractors were placed along the medial calcar and posteriorly over the greater trochanter.  Once the proximal femur was fully   exposed, I used a box osteotome to set orientation.  I then began   broaching with the starting chili pepper broach and passed this by hand and then broached up to 5.  With the 5 broach in place I chose a standard offset neck and did several trial reductions.  The offset was appropriate, leg lengths   appeared to be equal best matched with the +5 head ball trial confirmed radiographically.   Given these findings, I went ahead and dislocated the hip, repositioned all   retractors and positioned the right hip in the extended and abducted position.  The final 5 standard Actis stem was   chosen and it was impacted down to the level of neck cut.  Based on this   and the trial reductions, a final 36+5 Articuleze metal head ball delta ceramic ball was chosen and   impacted onto a clean and dry trunnion, and the hip was reduced.  The   hip had been irrigated throughout the case again at this point.  I did   reapproximate the superior capsular leaflet to the anterior leaflet   using #1 Vicryl.  The fascia of the   tensor fascia lata muscle was then reapproximated using #1 Vicryl and #0 Stratafix sutures.  The   remaining wound was closed with 2-0  Vicryl and running 4-0 Monocryl.   The hip was cleaned, dried, and dressed sterilely using Dermabond and   Aquacel dressing.  The patient was then brought  to recovery room in stable condition tolerating the procedure well.    Griffith Citron, PA-C was present for the entirety of the case involved from   preoperative positioning, perioperative retractor management, general   facilitation of the case, as well as primary wound closure as assistant.            Pietro Cassis Alvan Dame, M.D.        02/07/2020 1:11 PM

## 2020-02-07 NOTE — Transfer of Care (Signed)
Immediate Anesthesia Transfer of Care Note  Patient: [name]  Procedure(s) Performed: Procedure(s) with comments: TOTAL HIP ARTHROPLASTY ANTERIOR APPROACH (Left) - 70 mins  Patient Location: PACU  Anesthesia Type:Spinal  Level of Consciousness:  sedated, patient cooperative and responds to stimulation  Airway & Oxygen Therapy:Patient Spontanous Breathing and Patient connected to face mask oxgen  Post-op Assessment:  Report given to PACU RN and Post -op Vital signs reviewed and stable  Post vital signs:  Reviewed and stable  Last Vitals:  Vitals:   02/07/20 1156  BP: (!) 91/57  Pulse: 75  Resp: 16  Temp: 36.9 C  SpO2: 123XX123    Complications: No apparent anesthesia complications

## 2020-02-07 NOTE — Discharge Instructions (Addendum)

## 2020-02-07 NOTE — Anesthesia Procedure Notes (Signed)
Spinal  Patient location during procedure: OR Start time: 02/07/2020 1:12 PM End time: 02/07/2020 1:15 PM Staffing Performed: anesthesiologist  Anesthesiologist: Albertha Ghee, MD Preanesthetic Checklist Completed: patient identified, IV checked, risks and benefits discussed, surgical consent, monitors and equipment checked, pre-op evaluation and timeout performed Spinal Block Patient position: sitting Prep: DuraPrep Patient monitoring: cardiac monitor, continuous pulse ox and blood pressure Approach: midline Location: L3-4 Injection technique: single-shot Needle Needle type: Spinocan  Needle gauge: 22 G Needle length: 9 cm Assessment Sensory level: T10 Additional Notes Functioning IV was confirmed and monitors were applied. Sterile prep and drape, including hand hygiene and sterile gloves were used. The patient was positioned and the spine was prepped. The skin was anesthetized with lidocaine.  Free flow of clear CSF was obtained prior to injecting local anesthetic into the CSF.  The spinal needle aspirated freely following injection.  The needle was carefully withdrawn.  The patient tolerated the procedure well.

## 2020-02-07 NOTE — Anesthesia Preprocedure Evaluation (Signed)
Anesthesia Evaluation  Patient identified by MRN, date of birth, ID band Patient awake    Reviewed: Allergy & Precautions, H&P , NPO status , Patient's Chart, lab work & pertinent test results  Airway Mallampati: II   Neck ROM: full    Dental   Pulmonary shortness of breath, sleep apnea , COPD, former smoker,    breath sounds clear to auscultation       Cardiovascular hypertension, + CAD, + Cardiac Stents, + CABG, + Peripheral Vascular Disease and +CHF   Rhythm:regular Rate:Normal  Cardiac clearance in the chart. Myoview (2018): low risk. Normal EF.   Neuro/Psych Anxiety Depression    GI/Hepatic GERD  ,  Endo/Other  Hypothyroidism Hyperthyroidism   Renal/GU Renal InsufficiencyRenal disease     Musculoskeletal  (+) Arthritis ,   Abdominal   Peds  Hematology   Anesthesia Other Findings   Reproductive/Obstetrics                             Anesthesia Physical Anesthesia Plan  ASA: III  Anesthesia Plan: Spinal   Post-op Pain Management:    Induction: Intravenous  PONV Risk Score and Plan: 2 and Propofol infusion and Treatment may vary due to age or medical condition  Airway Management Planned: Simple Face Mask  Additional Equipment:   Intra-op Plan:   Post-operative Plan:   Informed Consent: I have reviewed the patients History and Physical, chart, labs and discussed the procedure including the risks, benefits and alternatives for the proposed anesthesia with the patient or authorized representative who has indicated his/her understanding and acceptance.       Plan Discussed with: CRNA, Anesthesiologist and Surgeon  Anesthesia Plan Comments:         Anesthesia Quick Evaluation

## 2020-02-07 NOTE — Interval H&P Note (Signed)
History and Physical Interval Note:  02/07/2020 11:44 AM  [name]  has presented today for surgery, with the diagnosis of Left hip osteoarthritis.  The various methods of treatment have been discussed with the patient and family. After consideration of risks, benefits and other options for treatment, the patient has consented to  Procedure(s) with comments: TOTAL HIP ARTHROPLASTY ANTERIOR APPROACH (Left) - 70 mins as a surgical intervention.  The patient's history has been reviewed, patient examined, no change in status, stable for surgery.  I have reviewed the patient's chart and labs.  Questions were answered to the patient's satisfaction.     [name]

## 2020-02-08 DIAGNOSIS — G4730 Sleep apnea, unspecified: Secondary | ICD-10-CM | POA: Diagnosis not present

## 2020-02-08 DIAGNOSIS — Z96611 Presence of right artificial shoulder joint: Secondary | ICD-10-CM | POA: Diagnosis not present

## 2020-02-08 DIAGNOSIS — Z96653 Presence of artificial knee joint, bilateral: Secondary | ICD-10-CM | POA: Diagnosis not present

## 2020-02-08 DIAGNOSIS — I5033 Acute on chronic diastolic (congestive) heart failure: Secondary | ICD-10-CM | POA: Diagnosis not present

## 2020-02-08 DIAGNOSIS — Z96641 Presence of right artificial hip joint: Secondary | ICD-10-CM | POA: Diagnosis not present

## 2020-02-08 DIAGNOSIS — D649 Anemia, unspecified: Secondary | ICD-10-CM | POA: Diagnosis not present

## 2020-02-08 DIAGNOSIS — M109 Gout, unspecified: Secondary | ICD-10-CM | POA: Diagnosis not present

## 2020-02-08 DIAGNOSIS — Z79899 Other long term (current) drug therapy: Secondary | ICD-10-CM | POA: Diagnosis not present

## 2020-02-08 DIAGNOSIS — I2510 Atherosclerotic heart disease of native coronary artery without angina pectoris: Secondary | ICD-10-CM | POA: Diagnosis not present

## 2020-02-08 DIAGNOSIS — I739 Peripheral vascular disease, unspecified: Secondary | ICD-10-CM | POA: Diagnosis not present

## 2020-02-08 DIAGNOSIS — K219 Gastro-esophageal reflux disease without esophagitis: Secondary | ICD-10-CM | POA: Diagnosis not present

## 2020-02-08 DIAGNOSIS — Z955 Presence of coronary angioplasty implant and graft: Secondary | ICD-10-CM | POA: Diagnosis not present

## 2020-02-08 DIAGNOSIS — J439 Emphysema, unspecified: Secondary | ICD-10-CM | POA: Diagnosis not present

## 2020-02-08 DIAGNOSIS — Z7982 Long term (current) use of aspirin: Secondary | ICD-10-CM | POA: Diagnosis not present

## 2020-02-08 DIAGNOSIS — E785 Hyperlipidemia, unspecified: Secondary | ICD-10-CM | POA: Diagnosis not present

## 2020-02-08 DIAGNOSIS — M1612 Unilateral primary osteoarthritis, left hip: Secondary | ICD-10-CM | POA: Diagnosis not present

## 2020-02-08 DIAGNOSIS — I110 Hypertensive heart disease with heart failure: Secondary | ICD-10-CM | POA: Diagnosis not present

## 2020-02-08 DIAGNOSIS — Z87891 Personal history of nicotine dependence: Secondary | ICD-10-CM | POA: Diagnosis not present

## 2020-02-08 DIAGNOSIS — Z86711 Personal history of pulmonary embolism: Secondary | ICD-10-CM | POA: Diagnosis not present

## 2020-02-08 DIAGNOSIS — Z86718 Personal history of other venous thrombosis and embolism: Secondary | ICD-10-CM | POA: Diagnosis not present

## 2020-02-08 DIAGNOSIS — Z951 Presence of aortocoronary bypass graft: Secondary | ICD-10-CM | POA: Diagnosis not present

## 2020-02-08 LAB — CBC
HCT: 33.4 % — ABNORMAL LOW (ref 36.0–46.0)
Hemoglobin: 10.2 g/dL — ABNORMAL LOW (ref 12.0–15.0)
MCH: 28.7 pg (ref 26.0–34.0)
MCHC: 30.5 g/dL (ref 30.0–36.0)
MCV: 94.1 fL (ref 80.0–100.0)
Platelets: 199 10*3/uL (ref 150–400)
RBC: 3.55 MIL/uL — ABNORMAL LOW (ref 3.87–5.11)
RDW: 15.5 % (ref 11.5–15.5)
WBC: 10.2 10*3/uL (ref 4.0–10.5)
nRBC: 0 % (ref 0.0–0.2)

## 2020-02-08 LAB — BASIC METABOLIC PANEL
Anion gap: 8 (ref 5–15)
BUN: 37 mg/dL — ABNORMAL HIGH (ref 8–23)
CO2: 25 mmol/L (ref 22–32)
Calcium: 8.3 mg/dL — ABNORMAL LOW (ref 8.9–10.3)
Chloride: 104 mmol/L (ref 98–111)
Creatinine, Ser: 1.66 mg/dL — ABNORMAL HIGH (ref 0.44–1.00)
GFR calc Af Amer: 34 mL/min — ABNORMAL LOW (ref >60)
GFR calc non Af Amer: 29 mL/min — ABNORMAL LOW (ref >60)
Glucose, Bld: 147 mg/dL — ABNORMAL HIGH (ref 70–99)
Potassium: 4.8 mmol/L (ref 3.5–5.1)
Sodium: 137 mmol/L (ref 135–145)

## 2020-02-08 MED ORDER — RIVAROXABAN 10 MG PO TABS: 10.0000 mg | ORAL_TABLET | Freq: Every day | ORAL | 0 refills | Status: DC

## 2020-02-08 MED ORDER — ASPIRIN 81 MG PO CHEW: 81.0000 mg | CHEWABLE_TABLET | Freq: Two times a day (BID) | ORAL | 0 refills | Status: AC

## 2020-02-08 MED ORDER — FERROUS SULFATE 325 (65 FE) MG PO TABS: 325.0000 mg | ORAL_TABLET | Freq: Three times a day (TID) | ORAL | 0 refills | Status: DC

## 2020-02-08 MED ORDER — INFLUENZA VAC A&B SA ADJ QUAD 0.5 ML IM PRSY: 0.5000 mL | PREFILLED_SYRINGE | INTRAMUSCULAR | Status: DC

## 2020-02-08 MED ORDER — DOCUSATE SODIUM 100 MG PO CAPS: 100.0000 mg | ORAL_CAPSULE | Freq: Two times a day (BID) | ORAL | 0 refills | Status: DC

## 2020-02-08 MED ORDER — OXYCODONE HCL 10 MG PO TABS: 10.0000 mg | ORAL_TABLET | ORAL | 0 refills | Status: DC | PRN

## 2020-02-08 MED ORDER — POLYETHYLENE GLYCOL 3350 17 G PO PACK: 17.0000 g | PACK | Freq: Two times a day (BID) | ORAL | 0 refills | Status: DC

## 2020-02-08 MED ORDER — ACETAMINOPHEN 500 MG PO TABS: 1000.0000 mg | ORAL_TABLET | Freq: Three times a day (TID) | ORAL | 0 refills | Status: DC

## 2020-02-08 NOTE — Progress Notes (Signed)
Physical Therapy Treatment Patient Details Name: [name] MRN: [id number] DOB: 1941-04-21 Today's Date: 02/08/2020    History of Present Illness Pt is a 79 year old female s/p L DA THA and history of oxygen dependency, COPD, CHF, CAD    PT Comments    Pt ambulated short distance and practiced safe stair technique.  Pt also performed exercises and provided with HEP handout.  Pt aware not to perform standing exercises until reviewed with next therapist (for safety).  Pt also agreeable to have family member assist pt with mobilizing initially upon d/c home for safety.  Pt had no further questions and feels ready for d/c home today.    Follow Up Recommendations  Home health PT;Supervision/Assistance - 24 hour     Equipment Recommendations  None recommended by PT    Recommendations for Other Services       Precautions / Restrictions Precautions Precautions: Fall;None Restrictions LLE Weight Bearing: Weight bearing as tolerated    Mobility  Bed Mobility Overal bed mobility: Needs Assistance Bed Mobility: Supine to Sit     Supine to sit: Min assist;HOB elevated     General bed mobility comments: pt in recliner  Transfers Overall transfer level: Needs assistance Equipment used: Rolling walker (2 wheeled) Transfers: Sit to/from Stand Sit to Stand: Min guard Stand pivot transfers: Min assist       General transfer comment: verbal cues for UE and LE positioning, increased time, remained on 4L O2 [place] (baseline)  Ambulation/Gait Ambulation/Gait assistance: Min guard Gait Distance (Feet): 40 Feet Assistive device: Rolling walker (2 wheeled) Gait Pattern/deviations: Step-to pattern;Decreased stance time - left;Antalgic     General Gait Details: verbal cues for sequence, RW positioning, step length, posture   Stairs Stairs: Yes Stairs assistance: Min guard Stair Management: Step to pattern;Forwards;Two rails Number of Stairs: 3 General stair comments: verbal cues  for safe technique, sequence; min/guard for safety   Wheelchair Mobility    Modified Rankin (Stroke Patients Only)       Balance                                            Cognition Arousal/Alertness: Awake/alert Behavior During Therapy: WFL for tasks assessed/performed Overall Cognitive Status: Within Functional Limits for tasks assessed                                        Exercises Total Joint Exercises Ankle Circles/Pumps: AROM;Both;10 reps Quad Sets: AROM;Both;10 reps Short Arc Quad: AROM;Left;10 reps Heel Slides: AAROM;Left;10 reps Hip ABduction/ADduction: AAROM;Left;10 reps Long Arc Quad: AROM;Left;Seated;10 reps    General Comments        Pertinent Vitals/Pain Pain Assessment: 0-10 Pain Score: 6  Pain Location: left hip Pain Descriptors / Indicators: Sore;Aching Pain Intervention(s): Repositioned;Monitored during session;Premedicated before session    Home Living                      Prior Function            PT Goals (current goals can now be found in the care plan section) Acute Rehab PT Goals PT Goal Formulation: With patient Time For Goal Achievement: 02/13/20 Potential to Achieve Goals: Good Progress towards PT goals: Progressing toward goals    Frequency  7X/week      PT Plan Current plan remains appropriate    Co-evaluation              AM-PAC PT "6 Clicks" Mobility   Outcome Measure  Help needed turning from your back to your side while in a flat bed without using bedrails?: A Little Help needed moving from lying on your back to sitting on the side of a flat bed without using bedrails?: A Little Help needed moving to and from a bed to a chair (including a wheelchair)?: A Little Help needed standing up from a chair using your arms (e.g., wheelchair or bedside chair)?: A Little Help needed to walk in hospital room?: A Little Help needed climbing 3-5 steps with a railing? : A  Little 6 Click Score: 18    End of Session Equipment Utilized During Treatment: Gait belt;Oxygen Activity Tolerance: Patient tolerated treatment well Patient left: in chair;with call bell/phone within reach;with chair alarm set Nurse Communication: Mobility status PT Visit Diagnosis: Other abnormalities of gait and mobility (R26.89)     Time: 1438-1500 PT Time Calculation (min) (ACUTE ONLY): 22 min  Charges:  $Gait Training: 8-22 mins                    Arlyce Dice, DPT Acute Rehabilitation Services Office: [phone]   Trena Platt 02/08/2020, 3:39 PM

## 2020-02-08 NOTE — Progress Notes (Signed)
     Subjective: 1 Day Post-Op Procedure(s) (LRB): TOTAL HIP ARTHROPLASTY ANTERIOR APPROACH (Left)   Patient reports pain as moderate, controlled with medication.  Discussed her tolerance due to being on Oxy 10 mg qid prior to surgery.  Discussed the procedure and expectations moving forward. Ready to be discharged home, if she does well with PT.    Objective:   VITALS:   Vitals:   02/08/20 0632 02/08/20 0812  BP: (!) 106/47   Pulse: 70   Resp: 15   Temp: 98 F (36.7 C)   SpO2: 97% (!) 84%    Dorsiflexion/Plantar flexion intact Incision: dressing C/D/I No cellulitis present Compartment soft  LABS Recent Labs    02/08/20 0351  HGB 10.2*  HCT 33.4*  WBC 10.2  PLT 199    Recent Labs    02/08/20 0351  NA 137  K 4.8  BUN 37*  CREATININE 1.66*  GLUCOSE 147*     Assessment/Plan: 1 Day Post-Op Procedure(s) (LRB): TOTAL HIP ARTHROPLASTY ANTERIOR APPROACH (Left) Foley cath d/c'ed Advance diet Up with therapy D/C IV fluids Discharge home Follow up in 2 weeks at [facility] Follow up with OLIN,Tayvon Culley D in 2 weeks.  Contact information:  [facility] [address], [address] [place] [id number]            Danae Orleans PA-C  [facility] now [facility]  [place] [address], [place], [place], [place] [address] Phone: [phone] [email or web address] Facebook  [employer]

## 2020-02-08 NOTE — Evaluation (Addendum)
Physical Therapy Evaluation Patient Details Name: [name] MRN: [id number] DOB: 16-Jan-1941 Today's Date: 02/08/2020   History of Present Illness  Pt is a 79 year old female s/p L DA THA and history of oxygen dependency, COPD, CHF, CAD  Clinical Impression  Pt is s/p THA resulting in the deficits listed below (see PT Problem List).  Pt will benefit from skilled PT to increase their independence and safety with mobility to allow discharge to the venue listed below.   Pt with increased pain this morning however agreeable to mobilize as tolerated.  Pt assisted OOB to recliner and performed a couple exercises.  Pt hopeful to d/c home with family assist.          Follow Up Recommendations Home health PT;Supervision/Assistance - 24 hour    Equipment Recommendations  None recommended by PT    Recommendations for Other Services       Precautions / Restrictions Precautions Precautions: Fall;None Restrictions LLE Weight Bearing: Weight bearing as tolerated      Mobility  Bed Mobility Overal bed mobility: Needs Assistance Bed Mobility: Supine to Sit     Supine to sit: Min assist;HOB elevated     General bed mobility comments: assist for L LE over EOB for pain control  Transfers Overall transfer level: Needs assistance Equipment used: Rolling walker (2 wheeled) Transfers: Sit to/from [employer] Sit to Stand: Min guard Stand pivot transfers: Min guard       General transfer comment: verbal cues for UE and LE positioning, increased time and min/guard for safety, remained on 4L O2 [place] (baseline)  Ambulation/Gait             General Gait Details: deferred due to increased pain  Stairs            Wheelchair Mobility    Modified Rankin (Stroke Patients Only)       Balance                                             Pertinent Vitals/Pain Pain Assessment: 0-10 Pain Score: 8  Pain Location: left hip Pain Descriptors /  Indicators: Sore;Aching Pain Intervention(s): Repositioned;Monitored during session;Premedicated before session    Home Living Family/patient expects to be discharged to:: Private residence Living Arrangements: Alone Available Help at Discharge: Family;Available PRN/intermittently(son, grandddaughter, and hired help to assist upon d/c) Type of Home: House Home Access: Stairs to enter Entrance Stairs-Rails: Right Entrance Stairs-Number of Steps: 2 Home Layout: One level Home Equipment: [place] - 2 wheels;Cane - single point;Wheelchair - manual;Shower seat;Bedside Editor, commissioning      Prior Function Level of Independence: Independent with assistive device(s)         Comments: using RW prior to surgery for household and scooter for community     Hand Dominance        Extremity/Trunk Assessment        Lower Extremity Assessment Lower Extremity Assessment: LLE deficits/detail LLE Deficits / Details: able to perform ankle pumps, hip grossly 2+/5       Communication   Communication: HOH  Cognition Arousal/Alertness: Awake/alert Behavior During Therapy: WFL for tasks assessed/performed Overall Cognitive Status: Within Functional Limits for tasks assessed  General Comments      Exercises Total Joint Exercises Ankle Circles/Pumps: AROM;Both;10 reps Quad Sets: AROM;Both;10 reps   Assessment/Plan    PT Assessment Patient needs continued PT services  PT Problem List Decreased strength;Decreased range of motion;Decreased balance;Decreased knowledge of use of DME;Decreased mobility;Pain       PT Treatment Interventions Gait training;DME instruction;Therapeutic exercise;Stair training;Functional mobility training;Therapeutic activities;Patient/family education;Balance training    PT Goals (Current goals can be found in the Care Plan section)  Acute Rehab PT Goals PT Goal Formulation: With patient Time For  Goal Achievement: 02/13/20 Potential to Achieve Goals: Good    Frequency 7X/week   Barriers to discharge        Co-evaluation               AM-PAC PT "6 Clicks" Mobility  Outcome Measure Help needed turning from your back to your side while in a flat bed without using bedrails?: A Little Help needed moving from lying on your back to sitting on the side of a flat bed without using bedrails?: A Little Help needed moving to and from a bed to a chair (including a wheelchair)?: A Little Help needed standing up from a chair using your arms (e.g., wheelchair or bedside chair)?: A Little Help needed to walk in hospital room?: A Little Help needed climbing 3-5 steps with a railing? : A Little 6 Click Score: 18    End of Session Equipment Utilized During Treatment: Gait belt;Oxygen Activity Tolerance: Patient tolerated treatment well Patient left: in chair;with call bell/phone within reach;with chair alarm set   PT Visit Diagnosis: Other abnormalities of gait and mobility (R26.89)    Time: [id number] PT Time Calculation (min) (ACUTE ONLY): 19 min   Charges:   PT Evaluation $PT Eval Low Complexity: 1 Low        Kati PT, DPT Acute Rehabilitation Services Office: [phone]   Trena Platt 02/08/2020, 12:46 PM

## 2020-02-08 NOTE — Progress Notes (Signed)
Discharge instructions given to pt and all questions were answered.  

## 2020-02-08 NOTE — Progress Notes (Signed)
Spoke with PT about OSA- PT states she is aware of diagnosis but unable to wear CPAP mask. Educated PT on risks of noncompliance and benefits of utilizing. PT states she understands and seems open to idea of utilizing her supplemental 02 that she uses during day.

## 2020-02-08 NOTE — TOC Initial Note (Signed)
Transition of Care [facility]) - Initial/Assessment Note    Patient Details  Name: [name] MRN: [id number] Date of Birth: 12-28-1940  [facility]) CM/SW Contact:    Lia Hopping, [place] Phone Number: 02/08/2020, 11:56 AM  Clinical Narrative:                 Therapy Plan: HEP Has DME  Expected Discharge Plan: Home/Self Care Barriers to Discharge: No Barriers Identified   Patient Goals and CMS Choice        Expected Discharge Plan and Services Expected Discharge Plan: Home/Self Care         Expected Discharge Date: 02/08/20               DME Arranged: Gilford Rile rolling DME Agency: [employer] Date DME Agency Contacted: 02/08/20 Time DME Agency Contacted: 0900 Representative spoke with at DME Agency: Ovid Curd            Prior Living Arrangements/Services                       Activities of Daily Living Home Assistive Devices/Equipment: Dentures (specify type), Environmental consultant (specify type), Cane (specify quad or straight), Wheelchair, Oxygen ADL Screening (condition at time of admission) Patient's cognitive ability adequate to safely complete daily activities?: Yes Is the patient deaf or have difficulty hearing?: No Does the patient have difficulty seeing, even when wearing glasses/contacts?: No Does the patient have difficulty concentrating, remembering, or making decisions?: No Patient able to express need for assistance with ADLs?: Yes Does the patient have difficulty dressing or bathing?: No Independently performs ADLs?: Yes (appropriate for developmental age) Does the patient have difficulty walking or climbing stairs?: Yes Weakness of Legs: Both Weakness of Arms/Hands: Both  Permission Sought/Granted                  Emotional Assessment       Orientation: : Oriented to Self, Oriented to Place, Oriented to  Time, Oriented to Situation Alcohol / Substance Use: Not Applicable Psych Involvement: No (comment)  Admission diagnosis:  S/P  hip replacement, left [id number] Patient Active Problem List   Diagnosis Date Noted  . S/P hip replacement, left 02/07/2020  . History of pulmonary embolism 10/04/2019  . Dyspnea 12/24/2018  . Cellulitis 12/24/2018  . Otitis media 12/24/2018  . Diuretic-induced hypokalemia 10/08/2018  . Acute on chronic diastolic heart failure ([place]) 10/06/2018  . Hematuria 04/01/2018  . Acute lower UTI 04/01/2018  . Hematoma of abdominal wall, subsequent encounter 04/01/2018  . Chronic diastolic heart failure ([place]) 03/29/2018  . GERD without esophagitis 03/29/2018  . Chronic gout 03/29/2018  . Chronic bilateral low back pain without sciatica 03/29/2018  . Colitis 03/10/2018  . Ascites   . Pulmonary embolism ([place]) 03/01/2018  . 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[place]) 03/01/2018  . Acute on chronic respiratory failure with hypoxia ([place]) 03/01/2018  . Angiodysplasia of intestine   . Hematochezia   . Malnutrition of moderate degree 02/15/2017  . Blood loss anemia 02/13/2017  . Impingement syndrome of left shoulder 02/10/2013  . PAD (peripheral artery disease) ([place]) 11/17/2011  . Dyslipidemia 10/17/2011  . Coronary artery disease involving native coronary artery of native heart with angina pectoris ([place])   . Hypothyroidism 05/03/2008  . Essential hypertension 05/03/2008  . Sleep apnea 05/03/2008  . COPD (chronic obstructive pulmonary disease) ([place]) 04/12/2008   PCP:  [name], MD Pharmacy:   [facility], [place]  [place] [address] Phone: [phone] Fax: [place], [place] [address] Phone: [phone] Fax: [phone]     Social Determinants of Health (SDOH) Interventions    Readmission Risk Interventions No flowsheet data found.

## 2020-02-09 ENCOUNTER — Other Ambulatory Visit: Payer: Self-pay

## 2020-02-09 NOTE — Patient Outreach (Signed)
[facility]) Care Management  02/09/2020  FARINA PROBST 09/09/1941 [id number]   Telephone call to patient for follow up after discharge from outpatient hip replacement surgery.  Patient states she is doing pretty good.  She reports some pain but is taking pain medication regularly. She has help in the home and waiting for home health to make contact.  She is up with a walker.  She has a follow up with her surgeon in 2 weeks.  She states her granddaughter will be taking her.  She states she has a generator in her home if she loses power.  She denies any needs presently.   Plan: RN CM will contact patient again in the month of March and patient agreeable.    [name], RN, MSN [place] Management Care Management Coordinator Direct Line [phone] Cell [phone] Toll Free: [phone]  Fax: [phone]

## 2020-02-09 NOTE — Anesthesia Postprocedure Evaluation (Signed)
Anesthesia Post Note  Patient: REIGHLYNN DEFREECE  Procedure(s) Performed: TOTAL HIP ARTHROPLASTY ANTERIOR APPROACH (Left Hip)     Patient location during evaluation: PACU Anesthesia Type: Spinal Level of consciousness: oriented and awake and alert Pain management: pain level controlled Vital Signs Assessment: post-procedure vital signs reviewed and stable Respiratory status: spontaneous breathing, respiratory function stable and patient connected to nasal cannula oxygen Cardiovascular status: blood pressure returned to baseline and stable Postop Assessment: no headache, no backache and no apparent nausea or vomiting Anesthetic complications: no    Last Vitals:  Vitals:   02/08/20 0900 02/08/20 1405  BP:  (!) 137/52  Pulse:  83  Resp:  17  Temp:  36.4 C  SpO2: 100% 100%    Last Pain:  Vitals:   02/08/20 1437  TempSrc:   PainSc: [place]

## 2020-02-10 ENCOUNTER — Encounter: Payer: Self-pay | Admitting: *Deleted

## 2020-02-15 NOTE — Discharge Summary (Signed)
Physician Discharge Summary  Patient ID: [name] MRN: [id number] DOB/AGE: May 07, 1941 79 y.o.  Admit date: 02/07/2020 Discharge date: 02/08/2020   Procedures:  Procedure(s) (LRB): TOTAL HIP ARTHROPLASTY ANTERIOR APPROACH (Left)  Attending Physician:  Dr. Paralee Cancel   Admission Diagnoses:    Left hip primary OA / pain  Discharge Diagnoses:  Active Problems:   S/P hip replacement, left  Past Medical History:  Diagnosis Date  . Anemia    bld. transfusion post lumbar surgery- 2012  . Anxiety   . Arthralgia    NOS  . Blood transfusion 2012; 02/2018   WITH BACK SURGERY; "related to hematoma in stomach" (10/06/2018)  . CAD (coronary artery disease)    s/p CABG 2004; s/p DES to LM in 2010;  [facility] 10/29/11: EF 50-55%, mild elevated filling pressures, no pulmonary HTN, LM 90% ISR, LAD and CFX occluded, S-RI occluded (old), S-OM3 ok and L-LAD ok, native nondominant RCA 95% -  med rx recommended ; Lexiscan Myoview 7/13 at [facility]: demonstrated "normal LV function, anterior attenuation and localized ischemia, inferior, basilar, mid section"  . Carotid artery disease ([place])    Carotid US [id number]:  RICA [id number]; LICA [id number]; R subclavian stenosis - Repeat 1 year. // Carotid US 05/2019: R 1-39; L 40-59; R vertebral with atypical antegrade flow; R subclavian stenosis >> repeat 1 year  . CHF (congestive heart failure) ([name]) 01/21/2020  . Chronic diastolic heart failure (HCC)    Echo 9/10: EF [id number], grade 1 diastolic dysfunction  . Chronic lower back pain   . COPD (chronic obstructive pulmonary disease) ([place])    Emphysema dxed by Dr. Woody Seller in [place] based on PFTs per pt in 2006; placed on albuterol  . Depression    TAKES CELEXA  AND  (OFF- WELBUTRIN)  . Diuretic-induced hypokalemia 10/08/2018  . DVT of lower extremity (deep venous thrombosis) (HCC)    recurrent. bilateral (2 episodes)  . Dyspnea    home o2 when needed  . Dysrhythmia    afib with cabg  . Exertional angina (HCC)    Treated with Isosorbide, Ranexa, amlodipine; intolerant to metoprolol  . GERD (gastroesophageal reflux disease)   . Gout    "on daily RX" (10/06/2018)  . HLD (hyperlipidemia)   . Hypertension   . Hyperthyroidism   . Obesity (BMI 30-39.9) 2009   BMI 33  . Osteoarthritis    "all over; hands" (10/06/2018)  . Oxygen deficiency   . Oxygen dependent    4L at home as needed (10/06/2018)  . Pneumonia   . PVD (peripheral vascular disease) ([place])   . Sleep apnea 2012   USED CPAP THEN  STARTED USING SPIRIVA , Nov. 2013- last evaluation , changed from mask to aparatus that is just for her nose.. ; reports 05-28-18 "the mask smothers me so i dont use it right now" (10/06/2018)    HPI:    [name], 79 y.o. female, has a history of pain and functional disability in the left hip(s) due to arthritis and patient has failed non-surgical conservative treatments for greater than 12 weeks to include NSAID's and/or analgesics, corticosteriod injections, use of assistive devices and activity modification.  Onset of symptoms was gradual starting 10 years ago with gradually worsening course since that time.The patient noted prior procedures of the hip to include arthroplasty on the right hip in [place] over 10 years ago.  Patient currently rates pain in the left hip at 9 out of 10 with activity. Patient has worsening  of pain with activity and weight bearing, trendelenberg gait, pain that interfers with activities of daily living and pain with passive range of motion. Patient has evidence of periarticular osteophytes and joint space narrowing by imaging studies. This condition presents safety issues increasing the risk of falls. There is no current active infection.  Risks, benefits and expectations were discussed with the patient.  Risks including but not limited to the risk of anesthesia, blood clots, nerve damage, blood vessel damage, failure of the prosthesis, infection and up to and including death.  Patient  understand the risks, benefits and expectations and wishes to proceed with surgery.   PCP: [name], MD   Discharged Condition: good  Hospital Course:  Patient underwent the above stated procedure on 02/07/2020. Patient tolerated the procedure well and brought to the recovery room in good condition and subsequently to the floor.  POD #1 BP: 106/47 ; Pulse: 70 ; Temp: 98 F (36.7 C) ; Resp: 15 Patient reports pain as moderate, controlled with medication.  Discussed her tolerance due to being on Oxy 10 mg qid prior to surgery.  Discussed the procedure and expectations moving forward. Ready to be discharged home. Dorsiflexion/plantar flexion intact, incision: dressing C/D/I, no cellulitis present and compartment soft.   LABS  Basename    HGB     10.2  HCT     33.4    Discharge Exam: General appearance: alert, cooperative and no distress Extremities: Homans sign is negative, no sign of DVT, no edema, redness or tenderness in the calves or thighs and no ulcers, gangrene or trophic changes  Disposition: Home with follow up in 2 weeks   Follow-up Information    Paralee Cancel, MD. Schedule an appointment as soon as possible for a visit in 2 weeks.   Specialty: Orthopedic Surgery Contact information: [address] [place] [address] [id number]           Discharge Instructions    Call MD / Call 911   Complete by: As directed    If you experience chest pain or shortness of breath, CALL 911 and be transported to the hospital emergency room.  If you develope a fever above 101 F, pus (white drainage) or increased drainage or redness at the wound, or calf pain, call your surgeon's office.   Change dressing   Complete by: As directed    Maintain surgical dressing until follow up in the clinic. If the edges start to pull up, may reinforce with tape. If the dressing is no longer working, may remove and cover with gauze and tape, but must keep the area dry and  clean.  Call with any questions or concerns.   Constipation Prevention   Complete by: As directed    Drink plenty of fluids.  Prune juice may be helpful.  You may use a stool softener, such as Colace (over the counter) 100 mg twice a day.  Use MiraLax (over the counter) for constipation as needed.   Diet - low sodium heart healthy   Complete by: As directed    Discharge instructions   Complete by: As directed    Maintain surgical dressing until follow up in the clinic. If the edges start to pull up, may reinforce with tape. If the dressing is no longer working, may remove and cover with gauze and tape, but must keep the area dry and clean.  Follow up in 2 weeks at [facility]. Call with any questions or concerns.   Increase  activity slowly as tolerated   Complete by: As directed    Weight bearing as tolerated with assist device (walker, cane, etc) as directed, use it as long as suggested by your surgeon or therapist, typically at least 4-6 weeks.   TED hose   Complete by: As directed    Use stockings (TED hose) for 2 weeks on both leg(s).  You may remove them at night for sleeping.      Allergies as of 02/08/2020      Reactions   Ativan [lorazepam] Other (See Comments)   Confusion    Pitavastatin Other (See Comments)   LIVALO: reaction not recalled   Ropinirole Nausea Only, Other (See Comments)   Makes legs jump, also   Zofran [ondansetron Hcl] Nausea Only   Zolpidem Tartrate Anxiety, Other (See Comments)   CONFUSION    Penicillins Rash, Other (See Comments)   Tolerated cephalosporins in past Has patient had a PCN reaction causing immediate rash, facial/tongue/throat swelling, SOB or lightheadedness with hypotension: Yes Has patient had a PCN reaction causing severe rash involving mucus membranes or skin necrosis: No Has patient had a PCN reaction that required hospitalization: No Has patient had a PCN reaction occurring within the last 10 years: No If all of the above answers are  "NO", then may proceed with Cephalosporin use.      Medication List    STOP taking these medications   aspirin EC 81 MG tablet Replaced by: aspirin 81 MG chewable tablet     TAKE these medications   acetaminophen 500 MG tablet Commonly known as: TYLENOL Take 2 tablets (1,000 mg total) by mouth every 8 (eight) hours.   allopurinol 100 MG tablet Commonly known as: ZYLOPRIM Take 100 mg by mouth daily.   ARIPiprazole 2 MG tablet Commonly known as: ABILIFY Take 2 mg by mouth daily.   ascorbic acid 500 MG tablet Commonly known as: VITAMIN C Take 500 mg by mouth daily.   aspirin 81 MG chewable tablet Commonly known as: Aspirin Childrens Chew 1 tablet (81 mg total) by mouth 2 (two) times daily. Start the day after finishing the Xarelto. Take for 4 weeks, then resume regular dose. Start taking on: February 24, 2020 Replaces: aspirin EC 81 MG tablet   atorvastatin 10 MG tablet Commonly known as: LIPITOR Take 10 mg by mouth daily.   citalopram 40 MG tablet Commonly known as: CELEXA Take 40 mg by mouth at bedtime.   cyclobenzaprine 10 MG tablet Commonly known as: FLEXERIL Take 10 mg by mouth 3 (three) times daily as needed for muscle spasms.   docusate sodium 100 MG capsule Commonly known as: Colace Take 1 capsule (100 mg total) by mouth 2 (two) times daily.   ferrous sulfate 325 (65 FE) MG tablet Commonly known as: FerrouSul Take 1 tablet (325 mg total) by mouth 3 (three) times daily with meals for 14 days. What changed: when to take this   furosemide 40 MG tablet Commonly known as: LASIX Take 40 mg by mouth daily in the afternoon. What changed: Another medication with the same name was changed. Make sure you understand how and when to take each.   furosemide 80 MG tablet Commonly known as: LASIX Take 1 tablet (80 mg total) by mouth daily. What changed: when to take this   gabapentin 100 MG capsule Commonly known as: NEURONTIN Take 100 mg by mouth daily.     guaiFENesin 600 MG 12 hr tablet Commonly known as: MUCINEX Take 600 mg by  mouth 2 (two) times daily.   Incruse Ellipta 62.5 MCG/INH Aepb Generic drug: umeclidinium bromide Inhale 1 puff into the lungs 2 (two) times daily.   Ipratropium-Albuterol 20-100 MCG/ACT Aers respimat Commonly known as: COMBIVENT Inhale 1 puff into the lungs every 6 (six) hours as needed for wheezing or shortness of breath (or to help expel mucous).   levothyroxine 50 MCG tablet Commonly known as: SYNTHROID Take 1 tablet (50 mcg total) by mouth daily at 6 (six) AM.   magnesium oxide 400 (241.3 Mg) MG tablet Commonly known as: MAG-OX Take 1 tablet (400 mg total) by mouth 2 (two) times daily. What changed: when to take this   Melatonin 10 MG Tabs Take 10 mg by mouth at bedtime.   neomycin-polymyxin-hydrocortisone 3.5-10000-1 OTIC suspension Commonly known as: CORTISPORIN Place 5 drops into the right ear daily as needed (ear pain).   nitroGLYCERIN 0.4 MG SL tablet Commonly known as: NITROSTAT Place 1 tablet (0.4 mg total) under the tongue every 5 (five) minutes as needed for chest pain. For chest pain   omeprazole 40 MG capsule Commonly known as: PRILOSEC Take 1 capsule (40 mg total) by mouth 2 (two) times daily. What changed: when to take this   Oxycodone HCl 10 MG Tabs Take 1-2 tablets (10-20 mg total) by mouth every 4 (four) hours as needed for moderate pain or severe pain. What changed:   how much to take  when to take this  reasons to take this   OXYGEN Inhale 4 L/min into the lungs as needed (DURING ALL TIMES OF EXERTION).   pilocarpine 1 % ophthalmic solution Commonly known as: PILOCAR Place 1 drop into both eyes 2 (two) times daily.   polyethylene glycol 17 g packet Commonly known as: MIRALAX / GLYCOLAX Take 17 g by mouth 2 (two) times daily.   potassium chloride 10 MEQ tablet Commonly known as: KLOR-CON Take 2 tablets (20 mEq total) by mouth daily.   rivaroxaban 10 MG Tabs  tablet Commonly known as: Xarelto Take 1 tablet (10 mg total) by mouth daily for 14 days.   roflumilast 500 MCG Tabs tablet Commonly known as: DALIRESP Take 500 mcg by mouth every evening.   spironolactone 25 MG tablet Commonly known as: ALDACTONE TAKE 1 TABLET BY MOUTH DAILY   traZODone 50 MG tablet Commonly known as: DESYREL Take 150 mg by mouth at bedtime.            Discharge Care Instructions  (From admission, onward)         Start     Ordered   02/08/20 0000  Change dressing    Comments: Maintain surgical dressing until follow up in the clinic. If the edges start to pull up, may reinforce with tape. If the dressing is no longer working, may remove and cover with gauze and tape, but must keep the area dry and clean.  Call with any questions or concerns.   02/08/20 [id number]           Signed: West Pugh. Lunetta Marina   PA-C  02/15/2020, 9:22 PM

## 2020-02-20 DIAGNOSIS — G894 Chronic pain syndrome: Secondary | ICD-10-CM | POA: Diagnosis not present

## 2020-02-20 DIAGNOSIS — Z5181 Encounter for therapeutic drug level monitoring: Secondary | ICD-10-CM | POA: Diagnosis not present

## 2020-02-20 DIAGNOSIS — Z79899 Other long term (current) drug therapy: Secondary | ICD-10-CM | POA: Diagnosis not present

## 2020-02-21 ENCOUNTER — Other Ambulatory Visit: Payer: Self-pay

## 2020-02-21 NOTE — Patient Outreach (Signed)
[facility]) Care Management  [place]  02/21/2020   JESMARIE BETTGER Mar 24, 1941 [id number]  Subjective: Telephone call to patient for follow up. Patient reports she is doing great after left hip replacement.  Saw surgeon recently and felt she was doing good.  Patient weight is up some to 166 lbs.  We discussed heart failure and when to notify physician. Patient states she is taking her fluid pill and working to get the fluid off as she does not want to go back in the hospital.  Patient still has her granddaughter there helping her and she is appreciative of the help.  She voices no concerns.     Objective:   Encounter Medications:  Outpatient Encounter Medications as of 02/21/2020  Medication Sig Note  . acetaminophen (TYLENOL) 500 MG tablet Take 2 tablets (1,000 mg total) by mouth every 8 (eight) hours.   Marland Kitchen allopurinol (ZYLOPRIM) 100 MG tablet Take 100 mg by mouth daily.   . ARIPiprazole (ABILIFY) 2 MG tablet Take 2 mg by mouth daily.   Marland Kitchen ascorbic acid (VITAMIN C) 500 MG tablet Take 500 mg by mouth daily.   Derrill Memo ON 02/24/2020] aspirin (ASPIRIN CHILDRENS) 81 MG chewable tablet Chew 1 tablet (81 mg total) by mouth 2 (two) times daily. Start the day after finishing the Xarelto. Take for 4 weeks, then resume regular dose.   Marland Kitchen atorvastatin (LIPITOR) 10 MG tablet Take 10 mg by mouth daily.    . citalopram (CELEXA) 40 MG tablet Take 40 mg by mouth at bedtime.   . cyclobenzaprine (FLEXERIL) 10 MG tablet Take 10 mg by mouth 3 (three) times daily as needed for muscle spasms.    Marland Kitchen docusate sodium (COLACE) 100 MG capsule Take 1 capsule (100 mg total) by mouth 2 (two) times daily.   . ferrous sulfate (FERROUSUL) 325 (65 FE) MG tablet Take 1 tablet (325 mg total) by mouth 3 (three) times daily with meals for 14 days.   . furosemide (LASIX) 40 MG tablet Take 40 mg by mouth daily in the afternoon.  01/26/2020: Pt taking both 40 mg tablet and the 80 mg tablet daily  . furosemide  (LASIX) 80 MG tablet Take 1 tablet (80 mg total) by mouth daily. (Patient taking differently: Take 80 mg by mouth every morning. ) 01/26/2020: Pt taking both 40 mg tablet and 80 mg tablet daily  . gabapentin (NEURONTIN) 100 MG capsule Take 100 mg by mouth daily.   Marland Kitchen guaiFENesin (MUCINEX) 600 MG 12 hr tablet Take 600 mg by mouth 2 (two) times daily.    . INCRUSE ELLIPTA 62.5 MCG/INH AEPB Inhale 1 puff into the lungs 2 (two) times daily.    . Ipratropium-Albuterol (COMBIVENT) 20-100 MCG/ACT AERS respimat Inhale 1 puff into the lungs every 6 (six) hours as needed for wheezing or shortness of breath (or to help expel mucous).    Marland Kitchen levothyroxine (SYNTHROID) 50 MCG tablet Take 1 tablet (50 mcg total) by mouth daily at 6 (six) AM.   . magnesium oxide (MAG-OX) 400 (241.3 Mg) MG tablet Take 1 tablet (400 mg total) by mouth 2 (two) times daily. (Patient taking differently: Take 400 mg by mouth daily. )   . Melatonin 10 MG TABS Take 10 mg by mouth at bedtime.    Marland Kitchen neomycin-polymyxin-hydrocortisone (CORTISPORIN) 3.5-10000-1 OTIC suspension Place 5 drops into the right ear daily as needed (ear pain).    . nitroGLYCERIN (NITROSTAT) 0.4 MG SL tablet Place 1 tablet (0.4 mg total)  under the tongue every 5 (five) minutes as needed for chest pain. For chest pain   . omeprazole (PRILOSEC) 40 MG capsule Take 1 capsule (40 mg total) by mouth 2 (two) times daily. (Patient taking differently: Take 40 mg by mouth daily. )   . oxyCODONE 10 MG TABS Take 1-2 tablets (10-20 mg total) by mouth every 4 (four) hours as needed for moderate pain or severe pain.   . OXYGEN Inhale 4 L/min into the lungs as needed (DURING ALL TIMES OF EXERTION).    Marland Kitchen pilocarpine (PILOCAR) 1 % ophthalmic solution Place 1 drop into both eyes 2 (two) times daily.   . polyethylene glycol (MIRALAX / GLYCOLAX) 17 g packet Take 17 g by mouth 2 (two) times daily.   . potassium chloride (K-DUR,KLOR-CON) 10 MEQ tablet Take 2 tablets (20 mEq total) by mouth daily.    . rivaroxaban (XARELTO) 10 MG TABS tablet Take 1 tablet (10 mg total) by mouth daily for 14 days.   . roflumilast (DALIRESP) 500 MCG TABS tablet Take 500 mcg by mouth every evening.    Marland Kitchen spironolactone (ALDACTONE) 25 MG tablet TAKE 1 TABLET BY MOUTH DAILY (Patient taking differently: Take 25 mg by mouth daily. )   . traZODone (DESYREL) 50 MG tablet Take 150 mg by mouth at bedtime.     No facility-administered encounter medications on file as of 02/21/2020.    Functional Status:  In your present state of health, do you have any difficulty performing the following activities: 02/07/2020 01/27/2020  Hearing? N N  Vision? N N  Difficulty concentrating or making decisions? N N  Walking or climbing stairs? Y Y  Comment - SOB  Dressing or bathing? N N  Doing errands, shopping? N [place] and eating ? - -  Using the Toilet? - -  In the past six months, have you accidently leaked urine? - -  Do you have problems with loss of bowel control? - -  Managing your Medications? - -  Comment - -  Managing your Finances? - -  Housekeeping or managing your Housekeeping? - -  Comment - -  Some recent data might be hidden    Fall/Depression Screening: Fall Risk  10/11/2019 05/13/2018 12/07/2017  Falls in the past year? 1 Yes No  Comment - - -  Number falls in past yr: 1 1 -  Injury with Fall? 1 No -  Risk for fall due to : History of fall(s);Impaired balance/gait History of fall(s) -  Follow up Falls prevention discussed Falls evaluation completed -   PHQ 2/9 Scores 10/11/2019 10/12/2018 05/13/2018 11/04/2017 05/26/2017 03/24/2017 03/23/2017  PHQ - 2 Score 2 0 6 3 2 3 3   PHQ- 9 Score 17 - 20 6 5 10 10     Assessment: Patient managing heart failure and recent hip replacement surgery.    Plan:  [facility] CM Care Plan Problem One     Most Recent Value  Care Plan Problem One  Support Needs for Self- Health Management CHF  Role Documenting the Problem One  Care Management Telephonic  Coordinator  Care Plan for Problem One  Active  [facility] Long Term Goal   Over the next 90 days, patient will demonstrate and/or verbalize understanding of self-health management for long term care of CHF  [facility] Long Term Goal Start Date  02/06/20  Interventions for Problem One Long Term Goal  Discussed weights with patient.  Reviewed signs of heart failure and when  to notify physician.  Last weight 166 lbs.       RN CM will contact patient again in the month of March and patient agreeable.   [name], RN, MSN [place] Management Care Management Coordinator Direct Line [phone] Cell [phone] Toll Free: [phone]  Fax: [phone]

## 2020-03-08 ENCOUNTER — Encounter: Payer: Self-pay | Admitting: Internal Medicine

## 2020-03-08 ENCOUNTER — Other Ambulatory Visit: Payer: Self-pay

## 2020-03-08 ENCOUNTER — Ambulatory Visit (INDEPENDENT_AMBULATORY_CARE_PROVIDER_SITE_OTHER): Payer: Medicare Other | Admitting: Internal Medicine

## 2020-03-08 VITALS — BP 98/60 | HR 98 | Ht 68.0 in | Wt 165.6 lb

## 2020-03-08 DIAGNOSIS — E785 Hyperlipidemia, unspecified: Secondary | ICD-10-CM

## 2020-03-08 DIAGNOSIS — I5032 Chronic diastolic (congestive) heart failure: Secondary | ICD-10-CM | POA: Diagnosis not present

## 2020-03-08 NOTE — Progress Notes (Signed)
Cardiology Office Note   Date:  03/08/2020   ID:  [name], DOB 12/13/1941, MRN [id number]  PCP:  Judy Roger, MD  Cardiologist:   Judy Carnes, MD   Pt presents for f/u of CAD    History of Present Illness: [name] is a 79 y.o. female with a history ofEthel C [name] is a 79 y.o. female with:   Coronary artery disease  ? S/p CABG 2004 ? S/p DES to LM in 2010 ? Myoview 9/18: dist ant and apical ischemia; low risk  Chronic diastolic CHF ? Last admit in 09/2019  Carotid artery disease  Recurrent DVT/pulmonary embolism ? anticoagulationDC'd in past due to GI bleed ? admx in 3/19 with multiple bilat PEs with L femoral DVT ? anticoagulationc/b large abd wall hemotoma >> s/p tx with PRBCs ? S/p IVC filter  PAD ? S/p SMA stenting in 2019 by [facility]  Hypertension   COPD  Cirrhosis  [name] was admitted in 09/2019 for CHF.  She was last seen 11/22/2019 by Judy Rives, PA-C.  Her diuretics had recently been adjusted at home for volume excess.   She was seen by [name] in December  Lasix was increased for a couple days    In Feb 2021 she underwent L THA  The pt denies CP   Breathing is OK   Still with some selling       Current Meds  Medication Sig  . acetaminophen (TYLENOL) 500 MG tablet Take 2 tablets (1,000 mg total) by mouth every 8 (eight) hours.  Marland Kitchen allopurinol (ZYLOPRIM) 100 MG tablet Take 100 mg by mouth daily.  . ARIPiprazole (ABILIFY) 2 MG tablet Take 2 mg by mouth daily.  Marland Kitchen ascorbic acid (VITAMIN C) 500 MG tablet Take 500 mg by mouth daily.  Marland Kitchen aspirin (ASPIRIN CHILDRENS) 81 MG chewable tablet Chew 1 tablet (81 mg total) by mouth 2 (two) times daily. Start the day after finishing the Xarelto. Take for 4 weeks, then resume regular dose.  Marland Kitchen atorvastatin (LIPITOR) 10 MG tablet Take 10 mg by mouth daily.   . citalopram (CELEXA) 40 MG tablet Take 40 mg by mouth at bedtime.  . cyclobenzaprine (FLEXERIL) 10 MG tablet Take 10 mg by mouth 3 (three)  times daily as needed for muscle spasms.   Marland Kitchen docusate sodium (COLACE) 100 MG capsule Take 1 capsule (100 mg total) by mouth 2 (two) times daily.  . furosemide (LASIX) 40 MG tablet Take 40 mg by mouth daily in the afternoon.   . furosemide (LASIX) 80 MG tablet Take 1 tablet (80 mg total) by mouth daily.  Marland Kitchen gabapentin (NEURONTIN) 100 MG capsule Take 100 mg by mouth daily.  Marland Kitchen guaiFENesin (MUCINEX) 600 MG 12 hr tablet Take 600 mg by mouth 2 (two) times daily.   . INCRUSE ELLIPTA 62.5 MCG/INH AEPB Inhale 1 puff into the lungs 2 (two) times daily.   . Ipratropium-Albuterol (COMBIVENT) 20-100 MCG/ACT AERS respimat Inhale 1 puff into the lungs every 6 (six) hours as needed for wheezing or shortness of breath (or to help expel mucous).   Marland Kitchen levothyroxine (SYNTHROID) 50 MCG tablet Take 1 tablet (50 mcg total) by mouth daily at 6 (six) AM.  . magnesium oxide (MAG-OX) 400 (241.3 Mg) MG tablet Take 1 tablet (400 mg total) by mouth 2 (two) times daily.  . Melatonin 10 MG TABS Take 10 mg by mouth at bedtime.   Marland Kitchen neomycin-polymyxin-hydrocortisone (CORTISPORIN) 3.5-10000-1 OTIC suspension Place 5 drops into  the right ear daily as needed (ear pain).   . nitroGLYCERIN (NITROSTAT) 0.4 MG SL tablet Place 1 tablet (0.4 mg total) under the tongue every 5 (five) minutes as needed for chest pain. For chest pain  . omeprazole (PRILOSEC) 40 MG capsule Take 1 capsule (40 mg total) by mouth 2 (two) times daily.  Marland Kitchen oxyCODONE 10 MG TABS Take 1-2 tablets (10-20 mg total) by mouth every 4 (four) hours as needed for moderate pain or severe pain.  . OXYGEN Inhale 4 L/min into the lungs as needed (DURING ALL TIMES OF EXERTION).   Marland Kitchen pilocarpine (PILOCAR) 1 % ophthalmic solution Place 1 drop into both eyes 2 (two) times daily.  . polyethylene glycol (MIRALAX / GLYCOLAX) 17 g packet Take 17 g by mouth 2 (two) times daily.  . potassium chloride (K-DUR,KLOR-CON) 10 MEQ tablet Take 2 tablets (20 mEq total) by mouth daily.  . roflumilast  (DALIRESP) 500 MCG TABS tablet Take 500 mcg by mouth every evening.   Marland Kitchen spironolactone (ALDACTONE) 25 MG tablet TAKE 1 TABLET BY MOUTH DAILY  . traZODone (DESYREL) 50 MG tablet Take 150 mg by mouth at bedtime.      Allergies:   Ativan [lorazepam], Pitavastatin, Ropinirole, Zofran [ondansetron hcl], Zolpidem tartrate, and Penicillins   Past Medical History:  Diagnosis Date  . Anemia    bld. transfusion post lumbar surgery- 2012  . Anxiety   . Arthralgia    NOS  . Blood transfusion 2012; 02/2018   WITH BACK SURGERY; "related to hematoma in stomach" (10/06/2018)  . CAD (coronary artery disease)    s/p CABG 2004; s/p DES to LM in 2010;  [place] 10/29/11: EF 50-55%, mild elevated filling pressures, no pulmonary HTN, LM 90% ISR, LAD and CFX occluded, S-RI occluded (old), S-OM3 ok and L-LAD ok, native nondominant RCA 95% -  med rx recommended ; Lexiscan Myoview 7/13 at [facility]: demonstrated "normal LV function, anterior attenuation and localized ischemia, inferior, basilar, mid section"  . Carotid artery disease ([place])    Carotid US [id number]:  RICA [id number]; LICA [id number]; R subclavian stenosis - Repeat 1 year. // Carotid US 05/2019: R 1-39; L 40-59; R vertebral with atypical antegrade flow; R subclavian stenosis >> repeat 1 year  . CHF (congestive heart failure) ([place]) 01/21/2020  . Chronic diastolic heart failure (HCC)    Echo 9/10: EF [id number], grade 1 diastolic dysfunction  . Chronic lower back pain   . COPD (chronic obstructive pulmonary disease) ([place])    Emphysema dxed by [name] in [place] based on PFTs per pt in 2006; placed on albuterol  . Depression    TAKES CELEXA  AND  (OFF- WELBUTRIN)  . Diuretic-induced hypokalemia 10/08/2018  . DVT of lower extremity (deep venous thrombosis) (HCC)    recurrent. bilateral (2 episodes)  . Dyspnea    home o2 when needed  . Dysrhythmia    afib with cabg  . Exertional angina (HCC)    Treated with Isosorbide, Ranexa, amlodipine; intolerant to metoprolol  .  GERD (gastroesophageal reflux disease)   . Gout    "on daily RX" (10/06/2018)  . HLD (hyperlipidemia)   . Hypertension   . Hyperthyroidism   . Obesity (BMI 30-39.9) 2009   BMI 33  . Osteoarthritis    "all over; hands" (10/06/2018)  . Oxygen deficiency   . Oxygen dependent    4L at home as needed (10/06/2018)  . Pneumonia   . PVD (peripheral vascular disease) ([place])   . Sleep apnea  2012   USED CPAP THEN  STARTED USING SPIRIVA , Nov. 2013- last evaluation , changed from mask to aparatus that is just for her nose.. ; reports 05-28-18 "the mask smothers me so i dont use it right now" (10/06/2018)    Past Surgical History:  Procedure Laterality Date  . ANKLE SURGERY  2004  . ANTERIOR CERVICAL DECOMP/DISCECTOMY FUSION  11/2007  . AUGMENTATION MAMMAPLASTY    . [place]   lower; another scheduled, opt. reports 4 back- lumbar, 3 cerv. fusions  for later 2009  . CARDIAC CATHETERIZATION  11/2009   Patent LIMA to LAD and patent SVG to OM1. Occluded SVG to ramus and diagonal. Left main: 90% ostial stenosis, LCX 60-70% proximal stenosis  . CATARACT EXTRACTION W/ INTRAOCULAR LENS  IMPLANT, BILATERAL Bilateral 2006  . COLONOSCOPY WITH PROPOFOL N/A 02/17/2017   Procedure: COLONOSCOPY WITH PROPOFOL;  Surgeon: Jerene Bears, MD;  Location: [facility] ENDOSCOPY;  Service: Endoscopy;  Laterality: N/A;  . CORONARY ANGIOPLASTY WITH STENT PLACEMENT  11/2009   Drug eluting stent to left main artery: 4.0 X 12 mm Ion   . CORONARY ARTERY BYPASS GRAFT  2004   "CABG X4"  . ESOPHAGOGASTRODUODENOSCOPY N/A 02/14/2017   Procedure: ESOPHAGOGASTRODUODENOSCOPY (EGD);  Surgeon: Doran Stabler, MD;  Location: [facility] ENDOSCOPY;  Service: Endoscopy;  Laterality: N/A;  . GIVENS CAPSULE STUDY N/A 02/15/2017   Procedure: GIVENS CAPSULE STUDY;  Surgeon: Doran Stabler, MD;  Location: [place];  Service: Endoscopy;  Laterality: N/A;  . GROIN MASS OPEN BIOPSY  2004  . IR IVC FILTER PLMT / S&I /IMG GUID/MOD SED  03/14/2018   . IR PARACENTESIS  03/10/2018  . JOINT REPLACEMENT    . LAPAROSCOPIC CHOLECYSTECTOMY    . PERIPHERAL VASCULAR INTERVENTION Left 03/05/2018   Procedure: PERIPHERAL VASCULAR INTERVENTION;  Surgeon: Elam Dutch, MD;  Location: [place] CV LAB;  Service: Cardiovascular;  Laterality: Left;  Attempted unsuccess\ful Per Dr. Eden Lathe  . PERIPHERAL VASCULAR INTERVENTION  03/08/2018   Procedure: PERIPHERAL VASCULAR INTERVENTION;  Surgeon: Waynetta Sandy, MD;  Location: [place] CV LAB;  Service: Cardiovascular;;  SMA Stent   . REPLACEMENT TOTAL KNEE BILATERAL Bilateral 2012  . REVERSE SHOULDER ARTHROPLASTY  02/26/2012   Procedure: REVERSE SHOULDER ARTHROPLASTY;  Surgeon: Marin Shutter, MD;  Location: [place];  Service: Orthopedics;  Laterality: Right;  RIGHT SHOULDER REVERSED ARTHROPLASTY  . SHOULDER ARTHROSCOPY WITH SUBACROMIAL DECOMPRESSION Left 02/10/2013   Procedure: SHOULDER ARTHROSCOPY WITH SUBACROMIAL DECOMPRESSION DISTAL CLAVICLE RESECTION;  Surgeon: Marin Shutter, MD;  Location: [place];  Service: Orthopedics;  Laterality: Left;  DISTAL CLAVICLE RESECTION  . TONSILLECTOMY    . TOTAL HIP ARTHROPLASTY  2007   Right  . TOTAL HIP ARTHROPLASTY Left 02/07/2020   Procedure: TOTAL HIP ARTHROPLASTY ANTERIOR APPROACH;  Surgeon: Paralee Cancel, MD;  Location: [facility];  Service: Orthopedics;  Laterality: Left;  70 mins  . TRANSURETHRAL RESECTION OF BLADDER TUMOR N/A 05/31/2018   Procedure: TRANSURETHRAL RESECTION OF BLADDER TUMOR (TURBT);  Surgeon: Lucas Mallow, MD;  Location: [facility];  Service: Urology;  Laterality: N/A;  . VISCERAL ANGIOGRAPHY N/A 03/05/2018   Procedure: VISCERAL ANGIOGRAPHY;  Surgeon: Elam Dutch, MD;  Location: [place] CV [facility];  Service: Cardiovascular;  Laterality: N/A;  . VISCERAL ANGIOGRAPHY N/A 03/08/2018   Procedure: VISCERAL ANGIOGRAPHY;  Surgeon: Waynetta Sandy, MD;  Location: [place] CV [facility];  Service: Cardiovascular;  Laterality: N/A;      Social History:  The patient  reports that she quit smoking about 19 years ago. Her smoking use included cigarettes. She has a 45.00 pack-year smoking history. She has never used smokeless tobacco. She reports that she does not drink alcohol or use drugs.   Family History:  The patient's family history includes COPD in her father; Colitis in her sister; Heart disease in her father and sister; Lung cancer in her father; Ovarian cancer in her mother; Stomach cancer in her sister.    ROS:  Please see the history of present illness. All other systems are reviewed and  Negative to the above problem except as noted.    PHYSICAL EXAM: VS:  BP 98/60   Pulse 98   Ht 5\' 8"  (1.727 m)   Wt 165 lb 9.6 oz (75.1 kg)   SpO2 90%   BMI 25.18 kg/m   GEN: Well nourished, well developed, in no acute distress  HEENT: normal  Neck: no JVD Cardiac: RRR; no murmurs, rubs, or gallops 1+ LE edema   Respiratory:  clear to auscultation bilaterally GI: soft, nontender, nondistended, + BS  No hepatomegaly  EKG:  EKG is not ordered today.   Lipid Panel    Component Value Date/Time   CHOL 106 07/01/2018 0800   CHOL 98 05/23/2014 0857   TRIG 88 07/01/2018 0800   TRIG 83 05/23/2014 0857   HDL 38 (L) 07/01/2018 0800   HDL 35 (L) 05/23/2014 0857   CHOLHDL 2.8 07/01/2018 0800   CHOLHDL 4 03/03/2013 1136   VLDL 24.4 03/03/2013 1136   LDLCALC 50 07/01/2018 0800   LDLCALC 46 05/23/2014 0857      Wt Readings from Last 3 Encounters:  03/08/20 165 lb 9.6 oz (75.1 kg)  01/31/20 163 lb 4.8 oz (74.1 kg)  12/19/19 173 lb (78.5 kg)      ASSESSMENT AND PLAN:  1  Acute on chronic diastolic CHF   Will get labs today    BMET and BNP   Kep on current meds for now and review after labs  2  CAD   No symptoms to sugg angina     3  HTN  BP is low    She denies dizziness  4  Recurrent PE   Keep on anticoagulation      Current medicines are reviewed at length with the patient today.  The patient does not have  concerns regarding medicines.  Signed, Judy Carnes, MD  03/08/2020 11:23 AM    [address], [place], [place]  [address] Phone: [phone]; Fax: [phone]

## 2020-03-08 NOTE — Patient Instructions (Signed)
Medication Instructions:  No changes *If you need a refill on your cardiac medications before your next appointment, please call your pharmacy*   Lab Work: Today: cbc, bmet, bnp, lipids  If you have labs (blood work) drawn today and your tests are completely normal, you will receive your results only by: Marland Kitchen MyChart Message (if you have MyChart) OR . A paper copy in the mail If you have any lab test that is abnormal or we need to change your treatment, we will call you to review the results.   Testing/Procedures: none   Follow-Up: At [facility], you and your health needs are our priority.  As part of our continuing mission to provide you with exceptional heart care, we have created designated Provider Care Teams.  These Care Teams include your primary Cardiologist (physician) and Advanced Practice Providers (APPs -  Physician Assistants and Nurse Practitioners) who all work together to provide you with the care you need, when you need it.  Your next appointment:   6 month(s)  The format for your next appointment:   In Person  Provider:   You may see Dorris Carnes, MD or one of the following Advanced Practice Providers on your designated Care Team:    [name], PA-C  [place], [place]  Daune Perch, NP    Other Instructions

## 2020-03-09 LAB — CBC
Hematocrit: 29.6 % — ABNORMAL LOW (ref 34.0–46.6)
Hemoglobin: 9.6 g/dL — ABNORMAL LOW (ref 11.1–15.9)
MCH: 28.7 pg (ref 26.6–33.0)
MCHC: 32.4 g/dL (ref 31.5–35.7)
MCV: 89 fL (ref 79–97)
Platelets: 324 10*3/uL (ref 150–450)
RBC: 3.34 x10E6/uL — ABNORMAL LOW (ref 3.77–5.28)
RDW: 14.2 % (ref 11.7–15.4)
WBC: 8.1 10*3/uL (ref 3.4–10.8)

## 2020-03-09 LAB — BASIC METABOLIC PANEL
BUN/Creatinine Ratio: 20 (ref 12–28)
BUN: 29 mg/dL — ABNORMAL HIGH (ref 8–27)
CO2: 25 mmol/L (ref 20–29)
Calcium: 9 mg/dL (ref 8.7–10.3)
Chloride: 100 mmol/L (ref 96–106)
Creatinine, Ser: 1.47 mg/dL — ABNORMAL HIGH (ref 0.57–1.00)
GFR calc Af Amer: 39 mL/min/{1.73_m2} — ABNORMAL LOW (ref >59)
GFR calc non Af Amer: 34 mL/min/{1.73_m2} — ABNORMAL LOW (ref >59)
Glucose: 99 mg/dL (ref 65–99)
Potassium: 3.6 mmol/L (ref 3.5–5.2)
Sodium: 141 mmol/L (ref 134–144)

## 2020-03-09 LAB — LIPID PANEL
Chol/HDL Ratio: 2.4 ratio (ref 0.0–4.4)
Cholesterol, Total: 89 mg/dL — ABNORMAL LOW (ref 100–199)
HDL: 37 mg/dL — ABNORMAL LOW (ref >39)
LDL Chol Calc (NIH): 36 mg/dL (ref 0–99)
Triglycerides: 76 mg/dL (ref 0–149)
VLDL Cholesterol Cal: 16 mg/dL (ref 5–40)

## 2020-03-09 LAB — PRO B NATRIURETIC PEPTIDE: NT-Pro BNP: 1185 pg/mL — ABNORMAL HIGH (ref 0–738)

## 2020-03-12 DIAGNOSIS — I10 Essential (primary) hypertension: Secondary | ICD-10-CM | POA: Diagnosis not present

## 2020-03-12 DIAGNOSIS — G894 Chronic pain syndrome: Secondary | ICD-10-CM | POA: Diagnosis not present

## 2020-03-13 ENCOUNTER — Telehealth: Payer: Self-pay | Admitting: *Deleted

## 2020-03-13 DIAGNOSIS — I5032 Chronic diastolic (congestive) heart failure: Secondary | ICD-10-CM

## 2020-03-13 MED ORDER — POTASSIUM CHLORIDE ER 10 MEQ PO TBCR: 20.0000 meq | EXTENDED_RELEASE_TABLET | Freq: Every day | ORAL | 3 refills | Status: DC

## 2020-03-13 MED ORDER — FUROSEMIDE 40 MG PO TABS: ORAL_TABLET | ORAL | 3 refills | Status: DC

## 2020-03-13 NOTE — Telephone Encounter (Signed)
-----   Message from Fay Records, MD sent at 03/09/2020  4:46 PM EDT ----- Hgb is down to 9.6  PRobably because of loss from hip surgery  Kidney funciton is relatively stable  FLuid is up   Lpids are excellent Recomm:  Take 80 mg lasix 2x per day    2 days per week   Take 80/40 on other days    (Say Mon/Thurs 80/80;   80/40 on other days)   On days take 80/80 take 2 pottasium tables.    In 3 wks get BMET and BNP

## 2020-03-13 NOTE — Telephone Encounter (Signed)
Patient has been informed of results and recommendations and verbalizes understanding.  She wrote the instructions so that she'll remember.  Her granddaughter fixes her pills. Labs on 04/03/20.

## 2020-03-15 ENCOUNTER — Other Ambulatory Visit: Payer: Self-pay

## 2020-03-15 NOTE — Patient Outreach (Signed)
[facility]) Care Management  [place]  03/15/2020   [name] [birth year] [id number]  Subjective: Telephone call to patient for follow up call.  Patient reports that she is not doing good. She states she saw PCP on Tuesday as her blood pressure has been up last report today 146/63. She states the only change the doctor made on the visit was with her pain medication. She states she is on Xtampza ER.  She states that the medication is really not helping her pain at this time.  She states that she may go the emergency room if she feels no better.  Advised patient to call PCP office today and notify them that new pain medication is not helping instead of going to the ER.  She states she will call after getting off the phone with CM.  She denies need for assistance with call. Patient reports that her weights are good and that her last weight was 160 lbs.  Patient reports compliance with medications. Discussed heart failure and importance of weights.  She verbalized understanding and appreciative of call today.    Objective:   Encounter Medications:  Outpatient Encounter Medications as of 03/15/2020  Medication Sig  . acetaminophen (TYLENOL) 500 MG tablet Take 2 tablets (1,000 mg total) by mouth every 8 (eight) hours.  Marland Kitchen allopurinol (ZYLOPRIM) 100 MG tablet Take 100 mg by mouth daily.  . ARIPiprazole (ABILIFY) 2 MG tablet Take 2 mg by mouth daily.  Marland Kitchen ascorbic acid (VITAMIN C) 500 MG tablet Take 500 mg by mouth daily.  Marland Kitchen aspirin (ASPIRIN CHILDRENS) 81 MG chewable tablet Chew 1 tablet (81 mg total) by mouth 2 (two) times daily. Start the day after finishing the Xarelto. Take for 4 weeks, then resume regular dose.  Marland Kitchen atorvastatin (LIPITOR) 10 MG tablet Take 10 mg by mouth daily.   . citalopram (CELEXA) 40 MG tablet Take 40 mg by mouth at bedtime.  . cyclobenzaprine (FLEXERIL) 10 MG tablet Take 10 mg by mouth 3 (three) times daily as needed for muscle spasms.   Marland Kitchen docusate  sodium (COLACE) 100 MG capsule Take 1 capsule (100 mg total) by mouth 2 (two) times daily.  . ferrous sulfate (FERROUSUL) 325 (65 FE) MG tablet Take 1 tablet (325 mg total) by mouth 3 (three) times daily with meals for 14 days.  . furosemide (LASIX) 40 MG tablet Take two tablets by mouth every Monday and Thursday afternoon.  Take one tablet all other afternoons.  . furosemide (LASIX) 80 MG tablet Take 1 tablet (80 mg total) by mouth daily.  Marland Kitchen gabapentin (NEURONTIN) 100 MG capsule Take 100 mg by mouth daily.  Marland Kitchen guaiFENesin (MUCINEX) 600 MG 12 hr tablet Take 600 mg by mouth 2 (two) times daily.   . INCRUSE ELLIPTA 62.5 MCG/INH AEPB Inhale 1 puff into the lungs 2 (two) times daily.   . Ipratropium-Albuterol (COMBIVENT) 20-100 MCG/ACT AERS respimat Inhale 1 puff into the lungs every 6 (six) hours as needed for wheezing or shortness of breath (or to help expel mucous).   Marland Kitchen levothyroxine (SYNTHROID) 50 MCG tablet Take 1 tablet (50 mcg total) by mouth daily at 6 (six) AM.  . magnesium oxide (MAG-OX) 400 (241.3 Mg) MG tablet Take 1 tablet (400 mg total) by mouth 2 (two) times daily.  . Melatonin 10 MG TABS Take 10 mg by mouth at bedtime.   Marland Kitchen neomycin-polymyxin-hydrocortisone (CORTISPORIN) 3.5-10000-1 OTIC suspension Place 5 drops into the right ear daily as needed (ear pain).   Marland Kitchen  nitroGLYCERIN (NITROSTAT) 0.4 MG SL tablet Place 1 tablet (0.4 mg total) under the tongue every 5 (five) minutes as needed for chest pain. For chest pain  . omeprazole (PRILOSEC) 40 MG capsule Take 1 capsule (40 mg total) by mouth 2 (two) times daily.  Marland Kitchen oxyCODONE 10 MG TABS Take 1-2 tablets (10-20 mg total) by mouth every 4 (four) hours as needed for moderate pain or severe pain.  . OXYGEN Inhale 4 L/min into the lungs as needed (DURING ALL TIMES OF EXERTION).   Marland Kitchen pilocarpine (PILOCAR) 1 % ophthalmic solution Place 1 drop into both eyes 2 (two) times daily.  . polyethylene glycol (MIRALAX / GLYCOLAX) 17 g packet Take 17 g by mouth  2 (two) times daily.  . potassium chloride (KLOR-CON) 10 MEQ tablet Take 2 tablets (20 mEq total) by mouth daily. Take 2 additional tablets every Monday and Thursday.  . rivaroxaban (XARELTO) 10 MG TABS tablet Take 1 tablet (10 mg total) by mouth daily for 14 days.  . roflumilast (DALIRESP) 500 MCG TABS tablet Take 500 mcg by mouth every evening.   Marland Kitchen spironolactone (ALDACTONE) 25 MG tablet TAKE 1 TABLET BY MOUTH DAILY  . traZODone (DESYREL) 50 MG tablet Take 150 mg by mouth at bedtime.    No facility-administered encounter medications on file as of 03/15/2020.    Functional Status:  In your present state of health, do you have any difficulty performing the following activities: 02/07/2020 01/27/2020  Hearing? N N  Vision? N N  Difficulty concentrating or making decisions? N N  Walking or climbing stairs? Y Y  Comment - SOB  Dressing or bathing? N N  Doing errands, shopping? N [place] and eating ? - -  Using the Toilet? - -  In the past six months, have you accidently leaked urine? - -  Do you have problems with loss of bowel control? - -  Managing your Medications? - -  Comment - -  Managing your Finances? - -  Housekeeping or managing your Housekeeping? - -  Comment - -  Some recent data might be hidden    Fall/Depression Screening: Fall Risk  10/11/2019 05/13/2018 12/07/2017  Falls in the past year? 1 Yes No  Comment - - -  Number falls in past yr: 1 1 -  Injury with Fall? 1 No -  Risk for fall due to : History of fall(s);Impaired balance/gait History of fall(s) -  Follow up Falls prevention discussed Falls evaluation completed -   PHQ 2/9 Scores 10/11/2019 10/12/2018 05/13/2018 11/04/2017 05/26/2017 03/24/2017 03/23/2017  PHQ - 2 Score 2 0 6 3 2 3 3   PHQ- 9 Score 17 - 20 6 5 10 10     Assessment: Patient pain not relieved with new pain medication ordered this week.  She will notify physician.  Patient continues to manage chronic heart failure.    Plan:   [facility] CM Care Plan Problem One     Most Recent Value  Care Plan Problem One  Support Needs for Self- Health Management CHF  Role Documenting the Problem One  Care Management Telephonic Coordinator  Care Plan for Problem One  Active  [facility] Long Term Goal   Over the next 90 days, patient will demonstrate and/or verbalize understanding of self-health management for long term care of CHF  [facility] Long Term Goal Start Date  02/06/20  Interventions for Problem One Long Term Goal  Reviewed weights with patient.  Discussed medication compliance.  Discussed importance of weights and limiting salt intake.      RN CM will contact patient in the month of April and patient agreeable.

## 2020-03-30 ENCOUNTER — Other Ambulatory Visit: Payer: Self-pay

## 2020-03-30 NOTE — Patient Outreach (Signed)
[facility]) Care Management  [place]  03/30/2020   [name] 11/03/1941 [id number]  Subjective: Telephone call to patient for follow up.  Patient reports she is doing good today.  She reports less pain.  Reports 5/10 for pain today.  Patient taking pain medication as ordered. Patient not using walker in the home.  Discussed fall safety and prevention.  Granddaughter comes by daily to make sure she takes her medications and that she is ok.  Patient reports now that she is home alone but plenty of support from her granddaughter.  Patient has a medical alert necklace just in case.  Patient continues to weigh daily with last weight 158 lbs. Reiterated importance of heart failure routine of weighing and limiting salt intake and notifying MD for any changes.  She verbalized understanding and voices no concerns.   Objective:   Encounter Medications:  Outpatient Encounter Medications as of 03/30/2020  Medication Sig  . acetaminophen (TYLENOL) 500 MG tablet Take 2 tablets (1,000 mg total) by mouth every 8 (eight) hours.  Marland Kitchen allopurinol (ZYLOPRIM) 100 MG tablet Take 100 mg by mouth daily.  . ARIPiprazole (ABILIFY) 2 MG tablet Take 2 mg by mouth daily.  Marland Kitchen ascorbic acid (VITAMIN C) 500 MG tablet Take 500 mg by mouth daily.  Marland Kitchen atorvastatin (LIPITOR) 10 MG tablet Take 10 mg by mouth daily.   . citalopram (CELEXA) 40 MG tablet Take 40 mg by mouth at bedtime.  . cyclobenzaprine (FLEXERIL) 10 MG tablet Take 10 mg by mouth 3 (three) times daily as needed for muscle spasms.   Marland Kitchen docusate sodium (COLACE) 100 MG capsule Take 1 capsule (100 mg total) by mouth 2 (two) times daily.  . ferrous sulfate (FERROUSUL) 325 (65 FE) MG tablet Take 1 tablet (325 mg total) by mouth 3 (three) times daily with meals for 14 days.  . furosemide (LASIX) 40 MG tablet Take two tablets by mouth every Monday and Thursday afternoon.  Take one tablet all other afternoons.  . furosemide (LASIX) 80 MG tablet Take  1 tablet (80 mg total) by mouth daily.  Marland Kitchen gabapentin (NEURONTIN) 100 MG capsule Take 100 mg by mouth daily.  Marland Kitchen guaiFENesin (MUCINEX) 600 MG 12 hr tablet Take 600 mg by mouth 2 (two) times daily.   . INCRUSE ELLIPTA 62.5 MCG/INH AEPB Inhale 1 puff into the lungs 2 (two) times daily.   . Ipratropium-Albuterol (COMBIVENT) 20-100 MCG/ACT AERS respimat Inhale 1 puff into the lungs every 6 (six) hours as needed for wheezing or shortness of breath (or to help expel mucous).   Marland Kitchen levothyroxine (SYNTHROID) 50 MCG tablet Take 1 tablet (50 mcg total) by mouth daily at 6 (six) AM.  . magnesium oxide (MAG-OX) 400 (241.3 Mg) MG tablet Take 1 tablet (400 mg total) by mouth 2 (two) times daily.  . Melatonin 10 MG TABS Take 10 mg by mouth at bedtime.   Marland Kitchen neomycin-polymyxin-hydrocortisone (CORTISPORIN) 3.5-10000-1 OTIC suspension Place 5 drops into the right ear daily as needed (ear pain).   . nitroGLYCERIN (NITROSTAT) 0.4 MG SL tablet Place 1 tablet (0.4 mg total) under the tongue every 5 (five) minutes as needed for chest pain. For chest pain  . omeprazole (PRILOSEC) 40 MG capsule Take 1 capsule (40 mg total) by mouth 2 (two) times daily.  Marland Kitchen oxyCODONE 10 MG TABS Take 1-2 tablets (10-20 mg total) by mouth every 4 (four) hours as needed for moderate pain or severe pain.  . OXYGEN Inhale 4 L/min  into the lungs as needed (DURING ALL TIMES OF EXERTION).   Marland Kitchen pilocarpine (PILOCAR) 1 % ophthalmic solution Place 1 drop into both eyes 2 (two) times daily.  . polyethylene glycol (MIRALAX / GLYCOLAX) 17 g packet Take 17 g by mouth 2 (two) times daily.  . potassium chloride (KLOR-CON) 10 MEQ tablet Take 2 tablets (20 mEq total) by mouth daily. Take 2 additional tablets every Monday and Thursday.  . rivaroxaban (XARELTO) 10 MG TABS tablet Take 1 tablet (10 mg total) by mouth daily for 14 days.  . roflumilast (DALIRESP) 500 MCG TABS tablet Take 500 mcg by mouth every evening.   Marland Kitchen spironolactone (ALDACTONE) 25 MG tablet TAKE 1  TABLET BY MOUTH DAILY  . traZODone (DESYREL) 50 MG tablet Take 150 mg by mouth at bedtime.    No facility-administered encounter medications on file as of 03/30/2020.    Functional Status:  In your present state of health, do you have any difficulty performing the following activities: 02/07/2020 01/27/2020  Hearing? N N  Vision? N N  Difficulty concentrating or making decisions? N N  Walking or climbing stairs? Y Y  Comment - SOB  Dressing or bathing? N N  Doing errands, shopping? N [place] and eating ? - -  Using the Toilet? - -  In the past six months, have you accidently leaked urine? - -  Do you have problems with loss of bowel control? - -  Managing your Medications? - -  Comment - -  Managing your Finances? - -  Housekeeping or managing your Housekeeping? - -  Comment - -  Some recent data might be hidden    Fall/Depression Screening: Fall Risk  03/30/2020 10/11/2019 05/13/2018  Falls in the past year? 1 1 Yes  Comment - - -  Number falls in past yr: 1 1 1   Injury with Fall? 1 1 No  Risk for fall due to : Impaired balance/gait;History of fall(s) History of fall(s);Impaired balance/gait History of fall(s)  Follow up Falls prevention discussed Falls prevention discussed Falls evaluation completed   PHQ 2/9 Scores 03/30/2020 10/11/2019 10/12/2018 05/13/2018 11/04/2017 05/26/2017 03/24/2017  PHQ - 2 Score 0 2 0 6 3 2 3   PHQ- 9 Score - 17 - 20 6 5 10     Assessment: Patient continues to manage chronic illnesses and benefits from telephonic outreach.    Plan:  [facility] CM Care Plan Problem One     Most Recent Value  Care Plan Problem One  Support Needs for Self- Health Management CHF  Role Documenting the Problem One  Care Management Telephonic Coordinator  Care Plan for Problem One  Active  THN Long Term Goal   Over the next 90 days, patient will demonstrate and/or verbalize understanding of self-health management for long term care of CHF  THN Long Term Goal  Start Date  02/06/20  Interventions for Problem One Long Term Goal  RN CM reiterated importance of weights.  Patient weighs daily and limits salt intake. LAst weight 158 lbs per patient.       RN CM will contact patient again in the month of April and patient agreeable.  [name], RN, MSN [place] Management Care Management Coordinator Direct Line [phone] Cell [phone] Toll Free: [phone]  Fax: [phone]

## 2020-04-02 DIAGNOSIS — Z79899 Other long term (current) drug therapy: Secondary | ICD-10-CM | POA: Diagnosis not present

## 2020-04-02 DIAGNOSIS — Z5181 Encounter for therapeutic drug level monitoring: Secondary | ICD-10-CM | POA: Diagnosis not present

## 2020-04-02 DIAGNOSIS — G894 Chronic pain syndrome: Secondary | ICD-10-CM | POA: Diagnosis not present

## 2020-04-03 ENCOUNTER — Other Ambulatory Visit: Payer: Medicare Other

## 2020-04-04 DIAGNOSIS — Z471 Aftercare following joint replacement surgery: Secondary | ICD-10-CM | POA: Diagnosis not present

## 2020-04-04 DIAGNOSIS — Z96642 Presence of left artificial hip joint: Secondary | ICD-10-CM | POA: Diagnosis not present

## 2020-04-13 ENCOUNTER — Other Ambulatory Visit: Payer: Self-pay

## 2020-04-13 DIAGNOSIS — G894 Chronic pain syndrome: Secondary | ICD-10-CM | POA: Diagnosis not present

## 2020-04-13 DIAGNOSIS — Z79899 Other long term (current) drug therapy: Secondary | ICD-10-CM | POA: Diagnosis not present

## 2020-04-13 DIAGNOSIS — H7290 Unspecified perforation of tympanic membrane, unspecified ear: Secondary | ICD-10-CM | POA: Diagnosis not present

## 2020-04-13 DIAGNOSIS — Z5181 Encounter for therapeutic drug level monitoring: Secondary | ICD-10-CM | POA: Diagnosis not present

## 2020-04-13 NOTE — Patient Outreach (Signed)
[employer] [facility]) Care Management  [place]  04/13/2020   [name] 08-Aug-1941 [id number]  Subjective: Telephone call to patient for disease management follow up. Patient reprots she is doing good.  Weight today 160 lbs.  Discussed heart failure and symptoms.  She verbalized understanding and voices no concerns.   Objective:   Encounter Medications:  Outpatient Encounter Medications as of 04/13/2020  Medication Sig  . acetaminophen (TYLENOL) 500 MG tablet Take 2 tablets (1,000 mg total) by mouth every 8 (eight) hours.  Marland Kitchen allopurinol (ZYLOPRIM) 100 MG tablet Take 100 mg by mouth daily.  . ARIPiprazole (ABILIFY) 2 MG tablet Take 2 mg by mouth daily.  Marland Kitchen ascorbic acid (VITAMIN C) 500 MG tablet Take 500 mg by mouth daily.  Marland Kitchen atorvastatin (LIPITOR) 10 MG tablet Take 10 mg by mouth daily.   . citalopram (CELEXA) 40 MG tablet Take 40 mg by mouth at bedtime.  . cyclobenzaprine (FLEXERIL) 10 MG tablet Take 10 mg by mouth 3 (three) times daily as needed for muscle spasms.   Marland Kitchen docusate sodium (COLACE) 100 MG capsule Take 1 capsule (100 mg total) by mouth 2 (two) times daily.  . ferrous sulfate (FERROUSUL) 325 (65 FE) MG tablet Take 1 tablet (325 mg total) by mouth 3 (three) times daily with meals for 14 days.  . furosemide (LASIX) 40 MG tablet Take two tablets by mouth every Monday and Thursday afternoon.  Take one tablet all other afternoons.  . furosemide (LASIX) 80 MG tablet Take 1 tablet (80 mg total) by mouth daily.  Marland Kitchen gabapentin (NEURONTIN) 100 MG capsule Take 100 mg by mouth daily.  Marland Kitchen guaiFENesin (MUCINEX) 600 MG 12 hr tablet Take 600 mg by mouth 2 (two) times daily.   . INCRUSE ELLIPTA 62.5 MCG/INH AEPB Inhale 1 puff into the lungs 2 (two) times daily.   . Ipratropium-Albuterol (COMBIVENT) 20-100 MCG/ACT AERS respimat Inhale 1 puff into the lungs every 6 (six) hours as needed for wheezing or shortness of breath (or to help expel mucous).   Marland Kitchen levothyroxine (SYNTHROID)  50 MCG tablet Take 1 tablet (50 mcg total) by mouth daily at 6 (six) AM.  . magnesium oxide (MAG-OX) 400 (241.3 Mg) MG tablet Take 1 tablet (400 mg total) by mouth 2 (two) times daily.  . Melatonin 10 MG TABS Take 10 mg by mouth at bedtime.   Marland Kitchen neomycin-polymyxin-hydrocortisone (CORTISPORIN) 3.5-10000-1 OTIC suspension Place 5 drops into the right ear daily as needed (ear pain).   . nitroGLYCERIN (NITROSTAT) 0.4 MG SL tablet Place 1 tablet (0.4 mg total) under the tongue every 5 (five) minutes as needed for chest pain. For chest pain  . omeprazole (PRILOSEC) 40 MG capsule Take 1 capsule (40 mg total) by mouth 2 (two) times daily.  Marland Kitchen oxyCODONE 10 MG TABS Take 1-2 tablets (10-20 mg total) by mouth every 4 (four) hours as needed for moderate pain or severe pain.  . OXYGEN Inhale 4 L/min into the lungs as needed (DURING ALL TIMES OF EXERTION).   Marland Kitchen pilocarpine (PILOCAR) 1 % ophthalmic solution Place 1 drop into both eyes 2 (two) times daily.  . polyethylene glycol (MIRALAX / GLYCOLAX) 17 g packet Take 17 g by mouth 2 (two) times daily.  . potassium chloride (KLOR-CON) 10 MEQ tablet Take 2 tablets (20 mEq total) by mouth daily. Take 2 additional tablets every Monday and Thursday.  . rivaroxaban (XARELTO) 10 MG TABS tablet Take 1 tablet (10 mg total) by mouth daily for 14  days.  . roflumilast (DALIRESP) 500 MCG TABS tablet Take 500 mcg by mouth every evening.   Marland Kitchen spironolactone (ALDACTONE) 25 MG tablet TAKE 1 TABLET BY MOUTH DAILY  . traZODone (DESYREL) 50 MG tablet Take 150 mg by mouth at bedtime.    No facility-administered encounter medications on file as of 04/13/2020.    Functional Status:  In your present state of health, do you have any difficulty performing the following activities: 02/07/2020 01/27/2020  Hearing? N N  Vision? N N  Difficulty concentrating or making decisions? N N  Walking or climbing stairs? Y Y  Comment - SOB  Dressing or bathing? N N  Doing errands, shopping? N [place] and eating ? - -  Using the Toilet? - -  In the past six months, have you accidently leaked urine? - -  Do you have problems with loss of bowel control? - -  Managing your Medications? - -  Comment - -  Managing your Finances? - -  Housekeeping or managing your Housekeeping? - -  Comment - -  Some recent data might be hidden    Fall/Depression Screening: Fall Risk  03/30/2020 10/11/2019 05/13/2018  Falls in the past year? 1 1 Yes  Comment - - -  Number falls in past yr: 1 1 1   Injury with Fall? 1 1 No  Risk for fall due to : Impaired balance/gait;History of fall(s) History of fall(s);Impaired balance/gait History of fall(s)  Follow up Falls prevention discussed Falls prevention discussed Falls evaluation completed   PHQ 2/9 Scores 03/30/2020 10/11/2019 10/12/2018 05/13/2018 11/04/2017 05/26/2017 03/24/2017  PHQ - 2 Score 0 2 0 6 3 2 3   PHQ- 9 Score - 17 - 20 6 5 10     Assessment: Patient continues to manage chronic illnesses and benefits from disease management.  Plan:  [facility] CM Care Plan Problem One     Most Recent Value  Care Plan Problem One  Support Needs for Self- Health Management CHF  Role Documenting the Problem One  Care Management Telephonic Coordinator  Care Plan for Problem One  Active  THN Long Term Goal   Over the next 90 days, patient will demonstrate and/or verbalize understanding of self-health management for long term care of CHF  THN Long Term Goal Start Date  04/13/20  Interventions for Problem One Long Term Goal  Patient weigfhing daily.  Knows to notify physician for any changes in condition.  Discussed signs of heart failure.       RN CM will contact patient in the month of June and patient agreeable.    [name], RN, MSN [place] Management Care Management Coordinator Direct Line [phone] Cell [phone] Toll Free: [phone]  Fax: [phone]

## 2020-05-14 DIAGNOSIS — Z5181 Encounter for therapeutic drug level monitoring: Secondary | ICD-10-CM | POA: Diagnosis not present

## 2020-05-14 DIAGNOSIS — Z79899 Other long term (current) drug therapy: Secondary | ICD-10-CM | POA: Diagnosis not present

## 2020-05-14 DIAGNOSIS — G894 Chronic pain syndrome: Secondary | ICD-10-CM | POA: Diagnosis not present

## 2020-06-01 DIAGNOSIS — H6121 Impacted cerumen, right ear: Secondary | ICD-10-CM | POA: Diagnosis not present

## 2020-06-01 DIAGNOSIS — S00412A Abrasion of left ear, initial encounter: Secondary | ICD-10-CM | POA: Diagnosis not present

## 2020-06-01 DIAGNOSIS — H9201 Otalgia, right ear: Secondary | ICD-10-CM | POA: Diagnosis not present

## 2020-06-01 DIAGNOSIS — H9319 Tinnitus, unspecified ear: Secondary | ICD-10-CM | POA: Diagnosis not present

## 2020-06-01 DIAGNOSIS — H9190 Unspecified hearing loss, unspecified ear: Secondary | ICD-10-CM | POA: Diagnosis not present

## 2020-06-05 ENCOUNTER — Ambulatory Visit (HOSPITAL_COMMUNITY): Admission: RE | Admit: 2020-06-05 | Payer: Medicare Other | Source: Ambulatory Visit

## 2020-06-05 DIAGNOSIS — H6121 Impacted cerumen, right ear: Secondary | ICD-10-CM | POA: Diagnosis not present

## 2020-06-05 DIAGNOSIS — H9319 Tinnitus, unspecified ear: Secondary | ICD-10-CM | POA: Diagnosis not present

## 2020-06-05 DIAGNOSIS — S00412D Abrasion of left ear, subsequent encounter: Secondary | ICD-10-CM | POA: Diagnosis not present

## 2020-06-14 ENCOUNTER — Other Ambulatory Visit: Payer: Self-pay

## 2020-06-14 NOTE — Patient Outreach (Signed)
[place] [facility]) Care Management  [place]  06/14/2020   [name] [birth year] [id number]  Subjective: Telephone call to patient for disease management follow up.  Patient reports she is doing fairly well.  Weight stable at 156 lbs.  Discussed importance of maintaining heart failure regimen.  She verbalized understanding and voices no concerns.    Objective:   Encounter Medications:  Outpatient Encounter Medications as of 06/14/2020  Medication Sig  . acetaminophen (TYLENOL) 500 MG tablet Take 2 tablets (1,000 mg total) by mouth every 8 (eight) hours.  Marland Kitchen allopurinol (ZYLOPRIM) 100 MG tablet Take 100 mg by mouth daily.  . ARIPiprazole (ABILIFY) 2 MG tablet Take 2 mg by mouth daily.  Marland Kitchen ascorbic acid (VITAMIN C) 500 MG tablet Take 500 mg by mouth daily.  Marland Kitchen atorvastatin (LIPITOR) 10 MG tablet Take 10 mg by mouth daily.   . citalopram (CELEXA) 40 MG tablet Take 40 mg by mouth at bedtime.  . cyclobenzaprine (FLEXERIL) 10 MG tablet Take 10 mg by mouth 3 (three) times daily as needed for muscle spasms.   Marland Kitchen docusate sodium (COLACE) 100 MG capsule Take 1 capsule (100 mg total) by mouth 2 (two) times daily.  . ferrous sulfate (FERROUSUL) 325 (65 FE) MG tablet Take 1 tablet (325 mg total) by mouth 3 (three) times daily with meals for 14 days.  . furosemide (LASIX) 40 MG tablet Take two tablets by mouth every Monday and Thursday afternoon.  Take one tablet all other afternoons.  . furosemide (LASIX) 80 MG tablet Take 1 tablet (80 mg total) by mouth daily.  Marland Kitchen gabapentin (NEURONTIN) 100 MG capsule Take 100 mg by mouth daily.  Marland Kitchen guaiFENesin (MUCINEX) 600 MG 12 hr tablet Take 600 mg by mouth 2 (two) times daily.   . INCRUSE ELLIPTA 62.5 MCG/INH AEPB Inhale 1 puff into the lungs 2 (two) times daily.   . Ipratropium-Albuterol (COMBIVENT) 20-100 MCG/ACT AERS respimat Inhale 1 puff into the lungs every 6 (six) hours as needed for wheezing or shortness of breath (or to help expel  mucous).   Marland Kitchen levothyroxine (SYNTHROID) 50 MCG tablet Take 1 tablet (50 mcg total) by mouth daily at 6 (six) AM.  . magnesium oxide (MAG-OX) 400 (241.3 Mg) MG tablet Take 1 tablet (400 mg total) by mouth 2 (two) times daily.  . Melatonin 10 MG TABS Take 10 mg by mouth at bedtime.   Marland Kitchen neomycin-polymyxin-hydrocortisone (CORTISPORIN) 3.5-10000-1 OTIC suspension Place 5 drops into the right ear daily as needed (ear pain).   . nitroGLYCERIN (NITROSTAT) 0.4 MG SL tablet Place 1 tablet (0.4 mg total) under the tongue every 5 (five) minutes as needed for chest pain. For chest pain  . omeprazole (PRILOSEC) 40 MG capsule Take 1 capsule (40 mg total) by mouth 2 (two) times daily.  Marland Kitchen oxyCODONE 10 MG TABS Take 1-2 tablets (10-20 mg total) by mouth every 4 (four) hours as needed for moderate pain or severe pain.  . OXYGEN Inhale 4 L/min into the lungs as needed (DURING ALL TIMES OF EXERTION).   Marland Kitchen pilocarpine (PILOCAR) 1 % ophthalmic solution Place 1 drop into both eyes 2 (two) times daily.  . polyethylene glycol (MIRALAX / GLYCOLAX) 17 g packet Take 17 g by mouth 2 (two) times daily.  . potassium chloride (KLOR-CON) 10 MEQ tablet Take 2 tablets (20 mEq total) by mouth daily. Take 2 additional tablets every Monday and Thursday.  . rivaroxaban (XARELTO) 10 MG TABS tablet Take 1 tablet (10 mg  total) by mouth daily for 14 days.  . roflumilast (DALIRESP) 500 MCG TABS tablet Take 500 mcg by mouth every evening.   Marland Kitchen spironolactone (ALDACTONE) 25 MG tablet TAKE 1 TABLET BY MOUTH DAILY  . traZODone (DESYREL) 50 MG tablet Take 150 mg by mouth at bedtime.    No facility-administered encounter medications on file as of 06/14/2020.    Functional Status:  In your present state of health, do you have any difficulty performing the following activities: 02/07/2020 01/27/2020  Hearing? N N  Vision? N N  Difficulty concentrating or making decisions? N N  Walking or climbing stairs? Y Y  Comment - SOB  Dressing or bathing? N N   Doing errands, shopping? N [place] and eating ? - -  Using the Toilet? - -  In the past six months, have you accidently leaked urine? - -  Do you have problems with loss of bowel control? - -  Managing your Medications? - -  Comment - -  Managing your Finances? - -  Housekeeping or managing your Housekeeping? - -  Comment - -  Some recent data might be hidden    Fall/Depression Screening: Fall Risk  06/14/2020 03/30/2020 10/11/2019  Falls in the past year? 1 1 1   Comment - - -  Number falls in past yr: 1 1 1   Injury with Fall? 1 1 1   Risk for fall due to : Impaired balance/gait Impaired balance/gait;History of fall(s) History of fall(s);Impaired balance/gait  Follow up - Falls prevention discussed Falls prevention discussed   PHQ 2/9 Scores 03/30/2020 10/11/2019 10/12/2018 05/13/2018 11/04/2017 05/26/2017 03/24/2017  PHQ - 2 Score 0 2 0 6 3 2 3   PHQ- 9 Score - 17 - 20 6 5 10     Assessment: Patient continues to manage chronic conditions and benefits from disease management follow up.  Plan:  [facility] CM Care Plan Problem One     Most Recent Value  Care Plan Problem One Support Needs for Self- Health Management CHF  Role Documenting the Problem One Care Management Telephonic Coordinator  Care Plan for Problem One Active  THN Long Term Goal  Over the next 90 days, patient will demonstrate and/or verbalize understanding of self-health management for long term care of CHF  THN Long Term Goal Start Date 06/14/20  Interventions for Problem One Long Term Goal Patient weighs daily with last weight 156.  Stressed importance of weights and weight perimeters.  Patient remains active.       RN CM will contact in the month of August and patient agreeable.  [name], RN, MSN [place] Management Care Management Coordinator Direct Line [phone] Cell [phone] Toll Free: [phone]  Fax: [phone]

## 2020-06-21 ENCOUNTER — Ambulatory Visit (HOSPITAL_COMMUNITY)
Admission: RE | Admit: 2020-06-21 | Discharge: 2020-06-21 | Disposition: A | Discharge status: Home / Self Care | Payer: Medicare Other | Source: Ambulatory Visit | Attending: Cardiology | Admitting: Cardiology

## 2020-06-21 DIAGNOSIS — I6523 Occlusion and stenosis of bilateral carotid arteries: Secondary | ICD-10-CM

## 2020-07-12 DIAGNOSIS — Z789 Other specified health status: Secondary | ICD-10-CM | POA: Diagnosis not present

## 2020-07-12 DIAGNOSIS — H9319 Tinnitus, unspecified ear: Secondary | ICD-10-CM | POA: Diagnosis not present

## 2020-07-12 DIAGNOSIS — H903 Sensorineural hearing loss, bilateral: Secondary | ICD-10-CM | POA: Diagnosis not present

## 2020-07-13 ENCOUNTER — Ambulatory Visit (HOSPITAL_COMMUNITY)
Admission: RE | Admit: 2020-07-13 | Discharge: 2020-07-13 | Disposition: A | Discharge status: Home / Self Care | Payer: Medicare Other | Source: Ambulatory Visit | Attending: Cardiovascular Disease | Admitting: Cardiovascular Disease

## 2020-07-13 ENCOUNTER — Other Ambulatory Visit: Payer: Self-pay

## 2020-07-13 ENCOUNTER — Other Ambulatory Visit (HOSPITAL_COMMUNITY): Payer: Self-pay | Admitting: Physician Assistant

## 2020-07-13 DIAGNOSIS — I6523 Occlusion and stenosis of bilateral carotid arteries: Secondary | ICD-10-CM | POA: Insufficient documentation

## 2020-07-16 ENCOUNTER — Encounter: Payer: Self-pay | Admitting: Physician Assistant

## 2020-07-16 DIAGNOSIS — I771 Stricture of artery: Secondary | ICD-10-CM | POA: Insufficient documentation

## 2020-07-25 DIAGNOSIS — J449 Chronic obstructive pulmonary disease, unspecified: Secondary | ICD-10-CM | POA: Diagnosis not present

## 2020-08-03 DIAGNOSIS — Z20828 Contact with and (suspected) exposure to other viral communicable diseases: Secondary | ICD-10-CM | POA: Diagnosis not present

## 2020-08-03 DIAGNOSIS — R062 Wheezing: Secondary | ICD-10-CM | POA: Diagnosis not present

## 2020-08-03 DIAGNOSIS — J449 Chronic obstructive pulmonary disease, unspecified: Secondary | ICD-10-CM | POA: Diagnosis not present

## 2020-08-03 DIAGNOSIS — R0789 Other chest pain: Secondary | ICD-10-CM | POA: Diagnosis not present

## 2020-08-03 DIAGNOSIS — R0600 Dyspnea, unspecified: Secondary | ICD-10-CM | POA: Diagnosis not present

## 2020-08-14 DIAGNOSIS — J189 Pneumonia, unspecified organism: Secondary | ICD-10-CM | POA: Diagnosis not present

## 2020-08-14 DIAGNOSIS — G894 Chronic pain syndrome: Secondary | ICD-10-CM | POA: Diagnosis not present

## 2020-08-15 ENCOUNTER — Other Ambulatory Visit: Payer: Self-pay

## 2020-08-15 NOTE — Patient Outreach (Signed)
[facility]) Care Management  [place]  08/15/2020   KAMILYA WAKEMAN 1941/11/25 [id number]  Subjective: Telephone call to patient for disease management follow up. Patient reports she was diagnosed with pneumonia last Friday.  She states she is better and has followed up with PCP and is due for another follow up in 1 week.  Discussed with patient signs of worsening and notifying physician. She verbalized understanding.  Patient reports that her weight was 162 lbs today. Discussed heart failure and management. She verbalized understanding.    Objective:   Encounter Medications:  Outpatient Encounter Medications as of 08/15/2020  Medication Sig  . acetaminophen (TYLENOL) 500 MG tablet Take 2 tablets (1,000 mg total) by mouth every 8 (eight) hours.  Marland Kitchen allopurinol (ZYLOPRIM) 100 MG tablet Take 100 mg by mouth daily.  . ARIPiprazole (ABILIFY) 2 MG tablet Take 2 mg by mouth daily.  Marland Kitchen ascorbic acid (VITAMIN C) 500 MG tablet Take 500 mg by mouth daily.  Marland Kitchen atorvastatin (LIPITOR) 10 MG tablet Take 10 mg by mouth daily.   . citalopram (CELEXA) 40 MG tablet Take 40 mg by mouth at bedtime.  . cyclobenzaprine (FLEXERIL) 10 MG tablet Take 10 mg by mouth 3 (three) times daily as needed for muscle spasms.   Marland Kitchen docusate sodium (COLACE) 100 MG capsule Take 1 capsule (100 mg total) by mouth 2 (two) times daily.  . ferrous sulfate (FERROUSUL) 325 (65 FE) MG tablet Take 1 tablet (325 mg total) by mouth 3 (three) times daily with meals for 14 days.  . furosemide (LASIX) 40 MG tablet Take two tablets by mouth every Monday and Thursday afternoon.  Take one tablet all other afternoons.  . furosemide (LASIX) 80 MG tablet Take 1 tablet (80 mg total) by mouth daily.  Marland Kitchen gabapentin (NEURONTIN) 100 MG capsule Take 100 mg by mouth daily.  Marland Kitchen guaiFENesin (MUCINEX) 600 MG 12 hr tablet Take 600 mg by mouth 2 (two) times daily.   . INCRUSE ELLIPTA 62.5 MCG/INH AEPB Inhale 1 puff into the lungs 2 (two)  times daily.   . Ipratropium-Albuterol (COMBIVENT) 20-100 MCG/ACT AERS respimat Inhale 1 puff into the lungs every 6 (six) hours as needed for wheezing or shortness of breath (or to help expel mucous).   Marland Kitchen levothyroxine (SYNTHROID) 50 MCG tablet Take 1 tablet (50 mcg total) by mouth daily at 6 (six) AM.  . magnesium oxide (MAG-OX) 400 (241.3 Mg) MG tablet Take 1 tablet (400 mg total) by mouth 2 (two) times daily.  . Melatonin 10 MG TABS Take 10 mg by mouth at bedtime.   Marland Kitchen neomycin-polymyxin-hydrocortisone (CORTISPORIN) 3.5-10000-1 OTIC suspension Place 5 drops into the right ear daily as needed (ear pain).   . nitroGLYCERIN (NITROSTAT) 0.4 MG SL tablet Place 1 tablet (0.4 mg total) under the tongue every 5 (five) minutes as needed for chest pain. For chest pain  . omeprazole (PRILOSEC) 40 MG capsule Take 1 capsule (40 mg total) by mouth 2 (two) times daily.  Marland Kitchen oxyCODONE 10 MG TABS Take 1-2 tablets (10-20 mg total) by mouth every 4 (four) hours as needed for moderate pain or severe pain.  . OXYGEN Inhale 4 L/min into the lungs as needed (DURING ALL TIMES OF EXERTION).   Marland Kitchen pilocarpine (PILOCAR) 1 % ophthalmic solution Place 1 drop into both eyes 2 (two) times daily.  . polyethylene glycol (MIRALAX / GLYCOLAX) 17 g packet Take 17 g by mouth 2 (two) times daily.  . potassium chloride (KLOR-CON) 10  MEQ tablet Take 2 tablets (20 mEq total) by mouth daily. Take 2 additional tablets every Monday and Thursday.  . rivaroxaban (XARELTO) 10 MG TABS tablet Take 1 tablet (10 mg total) by mouth daily for 14 days.  . roflumilast (DALIRESP) 500 MCG TABS tablet Take 500 mcg by mouth every evening.   Marland Kitchen spironolactone (ALDACTONE) 25 MG tablet TAKE 1 TABLET BY MOUTH DAILY  . traZODone (DESYREL) 50 MG tablet Take 150 mg by mouth at bedtime.    No facility-administered encounter medications on file as of 08/15/2020.    Functional Status:  In your present state of health, do you have any difficulty performing the  following activities: 08/15/2020 02/07/2020  Hearing? N N  Vision? N N  Difficulty concentrating or making decisions? N N  Walking or climbing stairs? Y Y  Comment patient has shortness of breath -  Dressing or bathing? N N  Doing errands, shopping? Y N  Comment family does shopping -  Conservation officer, nature and eating ? N -  Using the Toilet? N -  In the past six months, have you accidently leaked urine? Y -  Do you have problems with loss of bowel control? N -  Managing your Medications? Y -  Comment granddaughter helps -  Managing your Finances? N -  Housekeeping or managing your Housekeeping? Y -  Comment family assists -  Some recent data might be hidden    Fall/Depression Screening: Fall Risk  08/15/2020 06/14/2020 03/30/2020  Falls in the past year? 0 1 1  Comment - - -  Number falls in past yr: - 1 1  Injury with Fall? - 1 1  Risk for fall due to : - Impaired balance/gait Impaired balance/gait;History of fall(s)  Follow up - - Falls prevention discussed   PHQ 2/9 Scores 08/15/2020 03/30/2020 10/11/2019 10/12/2018 05/13/2018 11/04/2017 05/26/2017  PHQ - 2 Score 1 0 2 0 6 3 2   PHQ- 9 Score - - 17 - 20 6 5     Assessment: Patient managing chronic conditions with family assistance. Patient continues to benefit from disease management follow up.   Plan:  [facility] CM Care Plan Problem One     Most Recent Value  Care Plan Problem One Support Needs for Self- Health Management CHF  Role Documenting the Problem One Care Management Telephonic Coordinator  Care Plan for Problem One Active  [facility] Long Term Goal  Over the next 90 days, patient will demonstrate and/or verbalize understanding of self-health management for long term care of CHF  [facility]N Long Term Goal Start Date 08/15/20  Interventions for Problem One Long Term Goal Patient continues to weigh daily, wtch salt intake.  Discussed when to notify physician.       RN CM will contact in October and patient agreeable.   [name], RN,  MSN [place] Management Care Management Coordinator Direct Line [phone] Cell [phone] Toll Free: [phone]  Fax: [phone]

## 2020-08-25 DIAGNOSIS — J449 Chronic obstructive pulmonary disease, unspecified: Secondary | ICD-10-CM | POA: Diagnosis not present

## 2020-09-08 NOTE — Progress Notes (Signed)
Cardiology Office Note   Date:  09/10/2020   ID:  Judy, [name] June 22, 1941, MRN [id number]  PCP:  [name], MD  Cardiologist:   Dorris Carnes, MD   Pt presents for f/u of CAD    History of Present Illness: [name] is a 79 y.o. female with a history ofEthel C [name] is a 79 y.o. female with:   Coronary artery disease  ? S/p CABG 2004 ? S/p DES to LM in 2010 ? Myoview 9/18: dist ant and apical ischemia; low risk  Chronic diastolic CHF ? Last admit in 09/2019  Carotid artery disease  Recurrent DVT/pulmonary embolism ? anticoagulationDC'd in past due to GI bleed ? admx in 3/19 with multiple bilat PEs with L femoral DVT ? anticoagulationc/b large abd wall hemotoma >> s/p tx with PRBCs ? S/p IVC filter  PAD ? S/p SMA stenting in 2019 by [facility]  Hypertension   COPD  Cirrhosis  I saw the pt in clinic in march 2021  The pt denies CP  Breathing is short with activity   It has been like this for a long time   No acute worsening      Denies palpitations   Some LE swelling    Current Meds  Medication Sig  . acetaminophen (TYLENOL) 500 MG tablet Take 2 tablets (1,000 mg total) by mouth every 8 (eight) hours.  Marland Kitchen allopurinol (ZYLOPRIM) 100 MG tablet Take 100 mg by mouth daily.  . ARIPiprazole (ABILIFY) 2 MG tablet Take 2 mg by mouth daily.  Marland Kitchen ascorbic acid (VITAMIN C) 500 MG tablet Take 500 mg by mouth daily.  Marland Kitchen atorvastatin (LIPITOR) 10 MG tablet Take 10 mg by mouth daily.   . citalopram (CELEXA) 40 MG tablet Take 40 mg by mouth at bedtime.  . cyclobenzaprine (FLEXERIL) 10 MG tablet Take 10 mg by mouth 3 (three) times daily as needed for muscle spasms.   Marland Kitchen docusate sodium (COLACE) 100 MG capsule Take 1 capsule (100 mg total) by mouth 2 (two) times daily.  . furosemide (LASIX) 40 MG tablet Take two tablets by mouth every Monday and Thursday afternoon.  Take one tablet all other afternoons.  . furosemide (LASIX) 80 MG tablet Take 1 tablet (80 mg total) by  mouth daily.  Marland Kitchen gabapentin (NEURONTIN) 100 MG capsule Take 100 mg by mouth daily.  Marland Kitchen guaiFENesin (MUCINEX) 600 MG 12 hr tablet Take 600 mg by mouth 2 (two) times daily.   . INCRUSE ELLIPTA 62.5 MCG/INH AEPB Inhale 1 puff into the lungs 2 (two) times daily.   . Ipratropium-Albuterol (COMBIVENT) 20-100 MCG/ACT AERS respimat Inhale 1 puff into the lungs every 6 (six) hours as needed for wheezing or shortness of breath (or to help expel mucous).   Marland Kitchen levothyroxine (SYNTHROID) 50 MCG tablet Take 1 tablet (50 mcg total) by mouth daily at 6 (six) AM.  . magnesium oxide (MAG-OX) 400 (241.3 Mg) MG tablet Take 1 tablet (400 mg total) by mouth 2 (two) times daily.  . Melatonin 10 MG TABS Take 10 mg by mouth at bedtime.   . metolazone (ZAROXOLYN) 2.5 MG tablet   . neomycin-polymyxin-hydrocortisone (CORTISPORIN) 3.5-10000-1 OTIC suspension Place 5 drops into the right ear daily as needed (ear pain).   . nitroGLYCERIN (NITROSTAT) 0.4 MG SL tablet Place 1 tablet (0.4 mg total) under the tongue every 5 (five) minutes as needed for chest pain. For chest pain  . nystatin (MYCOSTATIN) 100000 UNIT/ML suspension   . ofloxacin (FLOXIN)  0.3 % OTIC solution   . omeprazole (PRILOSEC) 40 MG capsule Take 1 capsule (40 mg total) by mouth 2 (two) times daily.  Marland Kitchen oxyCODONE 10 MG TABS Take 1-2 tablets (10-20 mg total) by mouth every 4 (four) hours as needed for moderate pain or severe pain.  . OXYGEN Inhale 4 L/min into the lungs as needed (DURING ALL TIMES OF EXERTION).   Marland Kitchen pilocarpine (PILOCAR) 1 % ophthalmic solution Place 1 drop into both eyes 2 (two) times daily.  . polyethylene glycol (MIRALAX / GLYCOLAX) 17 g packet Take 17 g by mouth 2 (two) times daily.  . potassium chloride (KLOR-CON) 10 MEQ tablet Take 2 tablets (20 mEq total) by mouth daily. Take 2 additional tablets every Monday and Thursday.  . pramipexole (MIRAPEX) 0.25 MG tablet   . prednisoLONE acetate (PRED FORTE) 1 % ophthalmic suspension   . predniSONE  (DELTASONE) 20 MG tablet   . roflumilast (DALIRESP) 500 MCG TABS tablet Take 500 mcg by mouth every evening.   Marland Kitchen SPIRIVA RESPIMAT 1.25 MCG/ACT AERS   . spironolactone (ALDACTONE) 25 MG tablet TAKE 1 TABLET BY MOUTH DAILY  . traZODone (DESYREL) 50 MG tablet Take 150 mg by mouth at bedtime.      Allergies:   Ativan [lorazepam], Pitavastatin, Ropinirole, Zofran [ondansetron hcl], Zolpidem tartrate, and Penicillins   Past Medical History:  Diagnosis Date  . Anemia    bld. transfusion post lumbar surgery- 2012  . Anxiety   . Arthralgia    NOS  . Blood transfusion 2012; 02/2018   WITH BACK SURGERY; "related to hematoma in stomach" (10/06/2018)  . CAD (coronary artery disease)    s/p CABG 2004; s/p DES to LM in 2010;  [place] 10/29/11: EF 50-55%, mild elevated filling pressures, no pulmonary HTN, LM 90% ISR, LAD and CFX occluded, S-RI occluded (old), S-OM3 ok and L-LAD ok, native nondominant RCA 95% -  med rx recommended ; Lexiscan Myoview 7/13 at [facility]: demonstrated "normal LV function, anterior attenuation and localized ischemia, inferior, basilar, mid section"  . Carotid artery disease ([place])    Carotid US 4/17:  RICA 4-08; LICA 14-48; R subclavian stenosis - Repeat 1 year. // Carotid US 05/2019: R 1-39; L 40-59; R vertebral with atypical antegrade flow; R subclavian stenosis >> repeat 1 year // Carotid US 7/21: Bilat 40-59; L subclavian stenosis   . CHF (congestive heart failure) ([place]) 01/21/2020  . Chronic diastolic heart failure (HCC)    Echo 9/10: EF 18-56%, grade 1 diastolic dysfunction  . Chronic lower back pain   . COPD (chronic obstructive pulmonary disease) ([place])    Emphysema dxed by Dr. Woody Seller in [place] based on PFTs per pt in 2006; placed on albuterol  . Depression    TAKES CELEXA  AND  (OFF- WELBUTRIN)  . Diuretic-induced hypokalemia 10/08/2018  . DVT of lower extremity (deep venous thrombosis) (HCC)    recurrent. bilateral (2 episodes)  . Dyspnea    home o2 when needed  .  Dysrhythmia    afib with cabg  . Exertional angina (HCC)    Treated with Isosorbide, Ranexa, amlodipine; intolerant to metoprolol  . GERD (gastroesophageal reflux disease)   . Gout    "on daily RX" (10/06/2018)  . HLD (hyperlipidemia)   . Hypertension   . Hyperthyroidism   . L Subclavian Artery Stenosis    Carotid US 7/21   . Obesity (BMI 30-39.9) 2009   BMI 33  . Osteoarthritis    "all over; hands" (10/06/2018)  .  Oxygen deficiency   . Oxygen dependent    4L at home as needed (10/06/2018)  . Pneumonia   . PVD (peripheral vascular disease) ([place])   . Sleep apnea 2012   USED CPAP THEN  STARTED USING SPIRIVA , Nov. 2013- last evaluation , changed from mask to aparatus that is just for her nose.. ; reports 05-28-18 "the mask smothers me so i dont use it right now" (10/06/2018)    Past Surgical History:  Procedure Laterality Date  . ANKLE SURGERY  2004  . ANTERIOR CERVICAL DECOMP/DISCECTOMY FUSION  11/2007  . AUGMENTATION MAMMAPLASTY    . [place]   lower; another scheduled, opt. reports 4 back- lumbar, 3 cerv. fusions  for later 2009  . CARDIAC CATHETERIZATION  11/2009   Patent LIMA to LAD and patent SVG to OM1. Occluded SVG to ramus and diagonal. Left main: 90% ostial stenosis, LCX 60-70% proximal stenosis  . CATARACT EXTRACTION W/ INTRAOCULAR LENS  IMPLANT, BILATERAL Bilateral 2006  . COLONOSCOPY WITH PROPOFOL N/A 02/17/2017   Procedure: COLONOSCOPY WITH PROPOFOL;  Surgeon: Jerene Bears, MD;  [facility] END[facility]COPY;  Service: Endoscopy;  Laterality: N/A;  . CORONARY ANGIOPLASTY WITH STENT PLACEMENT  11/2009   Drug eluting stent to left main artery: 4.0 X 12 mm Ion   . CORONARY ARTERY BYPASS GRAFT  2004   "CABG X4"  . ESOPHAGOGASTRODUODENOSCOPY N/A 02/14/2017   Procedure: ESOPHAGOGASTRODUODENOSCOPY (EGD);  Surgeon: Doran Stabler, MD;  Location: [facility] ENDOSCOPY;  Service: Endoscopy;  Laterality: N/A;  . GIVENS CAPSULE STUDY N/A 02/15/2017   Procedure: GIVENS CAPSULE  STUDY;  Surgeon: Doran Stabler, MD;  Location: [place];  Service: Endoscopy;  Laterality: N/A;  . GROIN MASS OPEN BIOPSY  2004  . IR IVC FILTER PLMT / S&I /IMG GUID/MOD SED  03/14/2018  . IR PARACENTESIS  03/10/2018  . JOINT REPLACEMENT    . LAPAROSCOPIC CHOLECYSTECTOMY    . PERIPHERAL VASCULAR INTERVENTION Left 03/05/2018   Procedure: PERIPHERAL VASCULAR INTERVENTION;  Surgeon: Elam Dutch, MD;  Location: [place] CV LAB;  Service: Cardiovascular;  Laterality: Left;  Attempted unsuccess\ful Per Dr. Eden Lathe  . PERIPHERAL VASCULAR INTERVENTION  03/08/2018   Procedure: PERIPHERAL VASCULAR INTERVENTION;  Surgeon: Waynetta Sandy, MD;  Location: [place] CV LAB;  Service: Cardiovascular;;  SMA Stent   . REPLACEMENT TOTAL KNEE BILATERAL Bilateral 2012  . REVERSE SHOULDER ARTHROPLASTY  02/26/2012   Procedure: REVERSE SHOULDER ARTHROPLASTY;  Surgeon: Marin Shutter, MD;  Location: [place];  Service: Orthopedics;  Laterality: Right;  RIGHT SHOULDER REVERSED ARTHROPLASTY  . SHOULDER ARTHROSCOPY WITH SUBACROMIAL DECOMPRESSION Left 02/10/2013   Procedure: SHOULDER ARTHROSCOPY WITH SUBACROMIAL DECOMPRESSION DISTAL CLAVICLE RESECTION;  Surgeon: Marin Shutter, MD;  Location: [place];  Service: Orthopedics;  Laterality: Left;  DISTAL CLAVICLE RESECTION  . TONSILLECTOMY    . TOTAL HIP ARTHROPLASTY  2007   Right  . TOTAL HIP ARTHROPLASTY Left 02/07/2020   Procedure: TOTAL HIP ARTHROPLASTY ANTERIOR APPROACH;  Surgeon: Paralee Cancel, MD;  Location: [facility];  Service: Orthopedics;  Laterality: Left;  70 mins  . TRANSURETHRAL RESECTION OF BLADDER TUMOR N/A 05/31/2018   Procedure: TRANSURETHRAL RESECTION OF BLADDER TUMOR (TURBT);  Surgeon: Lucas Mallow, MD;  Location: [facility];  Service: Urology;  Laterality: N/A;  . VISCERAL ANGIOGRAPHY N/A 03/05/2018   Procedure: VISCERAL ANGIOGRAPHY;  Surgeon: Elam Dutch, MD;  Location: [place] [facility];  Service: Cardiovascular;  Laterality: N/A;  .  VISCERAL  ANGIOGRAPHY N/A 03/08/2018   Procedure: VISCERAL ANGIOGRAPHY;  Surgeon: Waynetta Sandy, MD;  Location: [place] [facility];  Service: Cardiovascular;  Laterality: N/A;     Social History:  The patient  reports that she quit smoking about 19 years ago. Her smoking use included cigarettes. She has a 45.00 pack-year smoking history. She has never used smokeless tobacco. She reports that she does not drink alcohol and does not use drugs.   Family History:  The patient's family history includes COPD in her father; Colitis in her sister; Heart disease in her father and sister; Lung cancer in her father; Ovarian cancer in her mother; Stomach cancer in her sister.    ROS:  Please see the history of present illness. All other systems are reviewed and  Negative to the above problem except as noted.    PHYSICAL EXAM: VS:  BP 140/68   Pulse 100   Ht 5\' 8"  (1.727 m)   Wt 166 lb (75.3 kg)   SpO2 95%   BMI 25.24 kg/m   GEN: Well nourished, well developed, in no acute distress  HEENT: normal  Neck: no JVD Cardiac: RRR; no murmurs, rubs, or gallops 1+ LE edema   Respiratory:  clear to auscultation bilaterally GI: soft, nontender, nondistended, + BS  No hepatomegaly   EKG:  EKG is ordered today.  SR  100 bpm  Occasional PAC   LVH with repol abnormality.   LAFB     Lipid Panel    Component Value Date/Time   CHOL 89 (L) 03/08/2020 1158   CHOL 98 05/23/2014 0857   TRIG 76 03/08/2020 1158   TRIG 83 05/23/2014 0857   HDL 37 (L) 03/08/2020 1158   HDL 35 (L) 05/23/2014 0857   CHOLHDL 2.4 03/08/2020 1158   CHOLHDL 4 03/03/2013 1136   VLDL 24.4 03/03/2013 1136   LDLCALC 36 03/08/2020 1158   LDLCALC 46 05/23/2014 0857      Wt Readings from Last 3 Encounters:  09/10/20 166 lb (75.3 kg)  03/08/20 165 lb 9.6 oz (75.1 kg)  01/31/20 163 lb 4.8 oz (74.1 kg)      ASSESSMENT AND PLAN:  1  Dyspnea.   I think this is multivactorial   She has COPD  Her FEV1 is 1   Also hx of PE   It is also noted that she has diastolic dysfunction on echo On exam today, her volume may be up a little    I am not convinced dyspnea represents an anginal equivalent Will check CBC, TSH, BNP and BMET      2  CAD  Pt denies CP   Again, not convinced of unstable angina    3  HTN  BP control is OK for age    78  Recurrent PE  Continue anticoagulation   Check CBC today   5  Lipids   LDL 36  HDL 37  Will be in touch with pt re lab results  Plan tentative for f/u in 6 months    Current medicines are reviewed at length with the patient today.  The patient does not have concerns regarding medicines.  Signed, Dorris Carnes, MD  09/10/2020 3:37 PM    [facility] [address], [place], [place]  [address] Phone: [phone]; Fax: [phone]

## 2020-09-10 ENCOUNTER — Encounter: Payer: Self-pay | Admitting: Internal Medicine

## 2020-09-10 ENCOUNTER — Other Ambulatory Visit: Payer: Self-pay

## 2020-09-10 ENCOUNTER — Ambulatory Visit (INDEPENDENT_AMBULATORY_CARE_PROVIDER_SITE_OTHER): Payer: Medicare HMO | Admitting: Internal Medicine

## 2020-09-10 VITALS — BP 140/68 | HR 100 | Ht 68.0 in | Wt 166.0 lb

## 2020-09-10 DIAGNOSIS — I5032 Chronic diastolic (congestive) heart failure: Secondary | ICD-10-CM

## 2020-09-10 DIAGNOSIS — I2510 Atherosclerotic heart disease of native coronary artery without angina pectoris: Secondary | ICD-10-CM

## 2020-09-10 DIAGNOSIS — I10 Essential (primary) hypertension: Secondary | ICD-10-CM | POA: Diagnosis not present

## 2020-09-10 NOTE — Patient Instructions (Signed)
Medication Instructions:  No changes *If you need a refill on your cardiac medications before your next appointment, please call your pharmacy*   Lab Work: Today: cbc, bmet, bnp, tsh If you have labs (blood work) drawn today and your tests are completely normal, you will receive your results only by:  [place] (if you have MyChart) OR  A paper copy in the mail If you have any lab test that is abnormal or we need to change your treatment, we will call you to review the results.   Testing/Procedures: none   Follow-Up: At [facility], you and your health needs are our priority.  As part of our continuing mission to provide you with exceptional heart care, we have created designated Provider Care Teams.  These Care Teams include your primary Cardiologist (physician) and Advanced Practice Providers (APPs -  Physician Assistants and Nurse Practitioners) who all work together to provide you with the care you need, when you need it.     Your next appointment:   6 month(s)  The format for your next appointment:   In Person  Provider:   You may see Dorris Carnes, MD or one of the following Advanced Practice Providers on your designated Care Team:    [name], PA-C  Robbie Lis, [place]    Other Instructions

## 2020-09-11 LAB — CBC
Hematocrit: 38.8 % (ref 34.0–46.6)
Hemoglobin: 12.6 g/dL (ref 11.1–15.9)
MCH: 29.2 pg (ref 26.6–33.0)
MCHC: 32.5 g/dL (ref 31.5–35.7)
MCV: 90 fL (ref 79–97)
Platelets: 245 10*3/uL (ref 150–450)
RBC: 4.31 x10E6/uL (ref 3.77–5.28)
RDW: 15.8 % — ABNORMAL HIGH (ref 11.7–15.4)
WBC: 10 10*3/uL (ref 3.4–10.8)

## 2020-09-11 LAB — BASIC METABOLIC PANEL
BUN/Creatinine Ratio: 24 (ref 12–28)
BUN: 28 mg/dL — ABNORMAL HIGH (ref 8–27)
CO2: 31 mmol/L — ABNORMAL HIGH (ref 20–29)
Calcium: 9.7 mg/dL (ref 8.7–10.3)
Chloride: 98 mmol/L (ref 96–106)
Creatinine, Ser: 1.19 mg/dL — ABNORMAL HIGH (ref 0.57–1.00)
GFR calc Af Amer: 50 mL/min/{1.73_m2} — ABNORMAL LOW (ref >59)
GFR calc non Af Amer: 43 mL/min/{1.73_m2} — ABNORMAL LOW (ref >59)
Glucose: 123 mg/dL — ABNORMAL HIGH (ref 65–99)
Potassium: 4.1 mmol/L (ref 3.5–5.2)
Sodium: 142 mmol/L (ref 134–144)

## 2020-09-11 LAB — PRO B NATRIURETIC PEPTIDE: NT-Pro BNP: 745 pg/mL — ABNORMAL HIGH (ref 0–738)

## 2020-09-11 LAB — TSH: TSH: 4.06 u[IU]/mL (ref 0.450–4.500)

## 2020-09-12 ENCOUNTER — Telehealth: Payer: Self-pay

## 2020-09-12 ENCOUNTER — Other Ambulatory Visit: Payer: Self-pay

## 2020-09-12 NOTE — Telephone Encounter (Signed)
-----   Message from Dorris Carnes V, MD sent at 09/11/2020 12:59 PM EDT ----- Thyroid function is normal  CBC is OK Electrolytes and kidney function are OK Fluid is up a little but not bad Keep on same meds

## 2020-09-12 NOTE — Telephone Encounter (Signed)
[facility] is requesting a refill on levothyroxine. Would Dr. Harrington Challenger like to refill this medication? Please address

## 2020-09-12 NOTE — Telephone Encounter (Signed)
The patient has been notified of the result and verbalized understanding.  All questions (if any) were answered. Wilma Flavin, RN 09/12/2020 9:37 AM

## 2020-09-13 MED ORDER — LEVOTHYROXINE SODIUM 50 MCG PO TABS: 50.0000 ug | ORAL_TABLET | Freq: Every day | ORAL | 3 refills | Status: DC

## 2020-09-24 DIAGNOSIS — J449 Chronic obstructive pulmonary disease, unspecified: Secondary | ICD-10-CM | POA: Diagnosis not present

## 2020-09-25 DIAGNOSIS — Z79899 Other long term (current) drug therapy: Secondary | ICD-10-CM | POA: Diagnosis not present

## 2020-09-25 DIAGNOSIS — Z23 Encounter for immunization: Secondary | ICD-10-CM | POA: Diagnosis not present

## 2020-09-25 DIAGNOSIS — Z5181 Encounter for therapeutic drug level monitoring: Secondary | ICD-10-CM | POA: Diagnosis not present

## 2020-09-25 DIAGNOSIS — I10 Essential (primary) hypertension: Secondary | ICD-10-CM | POA: Diagnosis not present

## 2020-09-25 DIAGNOSIS — G894 Chronic pain syndrome: Secondary | ICD-10-CM | POA: Diagnosis not present

## 2020-10-01 ENCOUNTER — Telehealth: Payer: Self-pay | Admitting: Internal Medicine

## 2020-10-01 NOTE — Telephone Encounter (Signed)
Spoke with pharmacy.  Confirmed w pharmacist that at most recent ov w Dr. Harrington Challenger there were no changes.  The patient is on lasix 40 mg daily except Mon and Thurs she takes 80 mg.  They are refilling the lasix 40 mg prescription.  I have removed the Lasix 80 mg order from her med list.

## 2020-10-01 NOTE — Telephone Encounter (Signed)
Pt c/o medication issue:  1. Name of Medication:  furosemide (LASIX) 40 MG tablet furosemide (LASIX) 80 MG tablet 2. How are you currently taking this medication (dosage and times per day)?   3. Are you having a reaction (difficulty breathing--STAT)?   4. What is your medication issue? Velta Addison is calling wanting to confirm if the patient is supposed to be prescribed both doses of this medication due to the pt requesting a refill for both.

## 2020-10-09 ENCOUNTER — Encounter (HOSPITAL_COMMUNITY): Payer: Self-pay | Admitting: Emergency Medicine

## 2020-10-09 ENCOUNTER — Emergency Department (HOSPITAL_COMMUNITY): Payer: Medicare HMO

## 2020-10-09 ENCOUNTER — Inpatient Hospital Stay (HOSPITAL_COMMUNITY): Payer: Medicare HMO

## 2020-10-09 ENCOUNTER — Inpatient Hospital Stay (HOSPITAL_COMMUNITY)
Admission: EM | Admit: 2020-10-09 | Discharge: 2020-10-13 | DRG: 871 | Disposition: A | Discharge status: Home Health | Payer: Medicare HMO | Attending: Family Medicine | Admitting: Family Medicine

## 2020-10-09 ENCOUNTER — Other Ambulatory Visit: Payer: Self-pay

## 2020-10-09 DIAGNOSIS — Z20822 Contact with and (suspected) exposure to covid-19: Secondary | ICD-10-CM | POA: Diagnosis not present

## 2020-10-09 DIAGNOSIS — Z86711 Personal history of pulmonary embolism: Secondary | ICD-10-CM

## 2020-10-09 DIAGNOSIS — Z8249 Family history of ischemic heart disease and other diseases of the circulatory system: Secondary | ICD-10-CM

## 2020-10-09 DIAGNOSIS — Z96643 Presence of artificial hip joint, bilateral: Secondary | ICD-10-CM | POA: Diagnosis present

## 2020-10-09 DIAGNOSIS — R0602 Shortness of breath: Secondary | ICD-10-CM

## 2020-10-09 DIAGNOSIS — G4730 Sleep apnea, unspecified: Secondary | ICD-10-CM | POA: Diagnosis present

## 2020-10-09 DIAGNOSIS — R051 Acute cough: Secondary | ICD-10-CM | POA: Diagnosis not present

## 2020-10-09 DIAGNOSIS — J9601 Acute respiratory failure with hypoxia: Secondary | ICD-10-CM | POA: Insufficient documentation

## 2020-10-09 DIAGNOSIS — I959 Hypotension, unspecified: Secondary | ICD-10-CM | POA: Diagnosis not present

## 2020-10-09 DIAGNOSIS — Z20828 Contact with and (suspected) exposure to other viral communicable diseases: Secondary | ICD-10-CM | POA: Diagnosis not present

## 2020-10-09 DIAGNOSIS — J439 Emphysema, unspecified: Secondary | ICD-10-CM | POA: Diagnosis present

## 2020-10-09 DIAGNOSIS — F32A Depression, unspecified: Secondary | ICD-10-CM | POA: Diagnosis present

## 2020-10-09 DIAGNOSIS — Z8679 Personal history of other diseases of the circulatory system: Secondary | ICD-10-CM | POA: Diagnosis not present

## 2020-10-09 DIAGNOSIS — R0600 Dyspnea, unspecified: Secondary | ICD-10-CM | POA: Diagnosis not present

## 2020-10-09 DIAGNOSIS — I25810 Atherosclerosis of coronary artery bypass graft(s) without angina pectoris: Secondary | ICD-10-CM | POA: Diagnosis present

## 2020-10-09 DIAGNOSIS — J9621 Acute and chronic respiratory failure with hypoxia: Secondary | ICD-10-CM | POA: Diagnosis not present

## 2020-10-09 DIAGNOSIS — A419 Sepsis, unspecified organism: Secondary | ICD-10-CM | POA: Diagnosis not present

## 2020-10-09 DIAGNOSIS — R509 Fever, unspecified: Secondary | ICD-10-CM | POA: Diagnosis not present

## 2020-10-09 DIAGNOSIS — E785 Hyperlipidemia, unspecified: Secondary | ICD-10-CM | POA: Diagnosis present

## 2020-10-09 DIAGNOSIS — N170 Acute kidney failure with tubular necrosis: Secondary | ICD-10-CM | POA: Diagnosis not present

## 2020-10-09 DIAGNOSIS — G8929 Other chronic pain: Secondary | ICD-10-CM | POA: Diagnosis present

## 2020-10-09 DIAGNOSIS — R0689 Other abnormalities of breathing: Secondary | ICD-10-CM | POA: Diagnosis not present

## 2020-10-09 DIAGNOSIS — N179 Acute kidney failure, unspecified: Secondary | ICD-10-CM

## 2020-10-09 DIAGNOSIS — Z955 Presence of coronary angioplasty implant and graft: Secondary | ICD-10-CM | POA: Diagnosis not present

## 2020-10-09 DIAGNOSIS — I130 Hypertensive heart and chronic kidney disease with heart failure and stage 1 through stage 4 chronic kidney disease, or unspecified chronic kidney disease: Secondary | ICD-10-CM | POA: Diagnosis present

## 2020-10-09 DIAGNOSIS — Z961 Presence of intraocular lens: Secondary | ICD-10-CM | POA: Diagnosis present

## 2020-10-09 DIAGNOSIS — I10 Essential (primary) hypertension: Secondary | ICD-10-CM | POA: Diagnosis present

## 2020-10-09 DIAGNOSIS — N1831 Chronic kidney disease, stage 3a: Secondary | ICD-10-CM | POA: Diagnosis present

## 2020-10-09 DIAGNOSIS — I5033 Acute on chronic diastolic (congestive) heart failure: Secondary | ICD-10-CM | POA: Diagnosis not present

## 2020-10-09 DIAGNOSIS — Z9981 Dependence on supplemental oxygen: Secondary | ICD-10-CM

## 2020-10-09 DIAGNOSIS — I82531 Chronic embolism and thrombosis of right popliteal vein: Secondary | ICD-10-CM | POA: Diagnosis present

## 2020-10-09 DIAGNOSIS — J189 Pneumonia, unspecified organism: Secondary | ICD-10-CM | POA: Diagnosis present

## 2020-10-09 DIAGNOSIS — Z888 Allergy status to other drugs, medicaments and biological substances status: Secondary | ICD-10-CM

## 2020-10-09 DIAGNOSIS — Z7901 Long term (current) use of anticoagulants: Secondary | ICD-10-CM

## 2020-10-09 DIAGNOSIS — J449 Chronic obstructive pulmonary disease, unspecified: Secondary | ICD-10-CM | POA: Diagnosis not present

## 2020-10-09 DIAGNOSIS — Z96611 Presence of right artificial shoulder joint: Secondary | ICD-10-CM | POA: Diagnosis present

## 2020-10-09 DIAGNOSIS — R6521 Severe sepsis with septic shock: Secondary | ICD-10-CM | POA: Diagnosis present

## 2020-10-09 DIAGNOSIS — M545 Low back pain, unspecified: Secondary | ICD-10-CM | POA: Diagnosis present

## 2020-10-09 DIAGNOSIS — Z96653 Presence of artificial knee joint, bilateral: Secondary | ICD-10-CM | POA: Diagnosis present

## 2020-10-09 DIAGNOSIS — I2510 Atherosclerotic heart disease of native coronary artery without angina pectoris: Secondary | ICD-10-CM | POA: Diagnosis present

## 2020-10-09 DIAGNOSIS — Z951 Presence of aortocoronary bypass graft: Secondary | ICD-10-CM

## 2020-10-09 DIAGNOSIS — R7989 Other specified abnormal findings of blood chemistry: Secondary | ICD-10-CM | POA: Diagnosis not present

## 2020-10-09 DIAGNOSIS — K7460 Unspecified cirrhosis of liver: Secondary | ICD-10-CM | POA: Diagnosis present

## 2020-10-09 DIAGNOSIS — Z95828 Presence of other vascular implants and grafts: Secondary | ICD-10-CM

## 2020-10-09 DIAGNOSIS — Z9842 Cataract extraction status, left eye: Secondary | ICD-10-CM

## 2020-10-09 DIAGNOSIS — E039 Hypothyroidism, unspecified: Secondary | ICD-10-CM | POA: Diagnosis present

## 2020-10-09 DIAGNOSIS — R000 Tachycardia, unspecified: Secondary | ICD-10-CM | POA: Diagnosis not present

## 2020-10-09 DIAGNOSIS — Z66 Do not resuscitate: Secondary | ICD-10-CM | POA: Diagnosis present

## 2020-10-09 DIAGNOSIS — M109 Gout, unspecified: Secondary | ICD-10-CM | POA: Diagnosis present

## 2020-10-09 DIAGNOSIS — Z87891 Personal history of nicotine dependence: Secondary | ICD-10-CM

## 2020-10-09 DIAGNOSIS — R609 Edema, unspecified: Secondary | ICD-10-CM | POA: Diagnosis not present

## 2020-10-09 DIAGNOSIS — R059 Cough, unspecified: Secondary | ICD-10-CM | POA: Diagnosis not present

## 2020-10-09 DIAGNOSIS — Z7952 Long term (current) use of systemic steroids: Secondary | ICD-10-CM

## 2020-10-09 DIAGNOSIS — I517 Cardiomegaly: Secondary | ICD-10-CM | POA: Diagnosis not present

## 2020-10-09 DIAGNOSIS — D631 Anemia in chronic kidney disease: Secondary | ICD-10-CM | POA: Diagnosis present

## 2020-10-09 DIAGNOSIS — Z79899 Other long term (current) drug therapy: Secondary | ICD-10-CM

## 2020-10-09 DIAGNOSIS — F419 Anxiety disorder, unspecified: Secondary | ICD-10-CM | POA: Diagnosis present

## 2020-10-09 DIAGNOSIS — Z825 Family history of asthma and other chronic lower respiratory diseases: Secondary | ICD-10-CM

## 2020-10-09 DIAGNOSIS — J80 Acute respiratory distress syndrome: Secondary | ICD-10-CM | POA: Diagnosis not present

## 2020-10-09 DIAGNOSIS — I739 Peripheral vascular disease, unspecified: Secondary | ICD-10-CM | POA: Diagnosis present

## 2020-10-09 DIAGNOSIS — Z79890 Hormone replacement therapy (postmenopausal): Secondary | ICD-10-CM

## 2020-10-09 DIAGNOSIS — R062 Wheezing: Secondary | ICD-10-CM | POA: Diagnosis not present

## 2020-10-09 DIAGNOSIS — Z880 Allergy status to penicillin: Secondary | ICD-10-CM

## 2020-10-09 DIAGNOSIS — Z9841 Cataract extraction status, right eye: Secondary | ICD-10-CM

## 2020-10-09 LAB — TROPONIN I (HIGH SENSITIVITY)
Troponin I (High Sensitivity): 36 ng/L — ABNORMAL HIGH (ref <18)
Troponin I (High Sensitivity): 41 ng/L — ABNORMAL HIGH (ref <18)

## 2020-10-09 LAB — BASIC METABOLIC PANEL
Anion gap: 13 (ref 5–15)
BUN: 46 mg/dL — ABNORMAL HIGH (ref 8–23)
CO2: 28 mmol/L (ref 22–32)
Calcium: 9.2 mg/dL (ref 8.9–10.3)
Chloride: 97 mmol/L — ABNORMAL LOW (ref 98–111)
Creatinine, Ser: 1.97 mg/dL — ABNORMAL HIGH (ref 0.44–1.00)
GFR, Estimated: 23 mL/min — ABNORMAL LOW (ref >60)
Glucose, Bld: 117 mg/dL — ABNORMAL HIGH (ref 70–99)
Potassium: 4.1 mmol/L (ref 3.5–5.1)
Sodium: 138 mmol/L (ref 135–145)

## 2020-10-09 LAB — ECHOCARDIOGRAM COMPLETE
Area-P 1/2: 4.15 cm2
Calc EF: 64.3 %
Height: 68 in
S' Lateral: 3 cm
Single Plane A2C EF: 64.1 %
Single Plane A4C EF: 62.5 %
Weight: 2680.79 oz

## 2020-10-09 LAB — CBC
HCT: 33.5 % — ABNORMAL LOW (ref 36.0–46.0)
Hemoglobin: 10.6 g/dL — ABNORMAL LOW (ref 12.0–15.0)
MCH: 29.7 pg (ref 26.0–34.0)
MCHC: 31.6 g/dL (ref 30.0–36.0)
MCV: 93.8 fL (ref 80.0–100.0)
Platelets: 280 10*3/uL (ref 150–400)
RBC: 3.57 MIL/uL — ABNORMAL LOW (ref 3.87–5.11)
RDW: 15.1 % (ref 11.5–15.5)
WBC: 18 10*3/uL — ABNORMAL HIGH (ref 4.0–10.5)
nRBC: 0 % (ref 0.0–0.2)

## 2020-10-09 LAB — D-DIMER, QUANTITATIVE: D-Dimer, Quant: 8.76 ug/mL-FEU — ABNORMAL HIGH (ref 0.00–0.50)

## 2020-10-09 LAB — RESPIRATORY PANEL BY RT PCR (FLU A&B, COVID)
Influenza A by PCR: NEGATIVE
Influenza B by PCR: NEGATIVE
SARS Coronavirus 2 by RT PCR: NEGATIVE

## 2020-10-09 LAB — LACTIC ACID, PLASMA: Lactic Acid, Venous: 1.2 mmol/L (ref 0.5–1.9)

## 2020-10-09 LAB — BRAIN NATRIURETIC PEPTIDE: B Natriuretic Peptide: 491.4 pg/mL — ABNORMAL HIGH (ref 0.0–100.0)

## 2020-10-09 LAB — PROCALCITONIN: Procalcitonin: 0.61 ng/mL

## 2020-10-09 MED ORDER — ACETAMINOPHEN 325 MG PO TABS
650.0000 mg | ORAL_TABLET | Freq: Four times a day (QID) | ORAL | Status: DC | PRN
  Administered 2020-10-10 – 2020-10-12 (×2): 650 mg via ORAL
  Filled 2020-10-09 (×3): qty 2

## 2020-10-09 MED ORDER — BUDESONIDE 0.5 MG/2ML IN SUSP
0.5000 mg | Freq: Two times a day (BID) | RESPIRATORY_TRACT | Status: DC
  Administered 2020-10-09 – 2020-10-13 (×8): 0.5 mg via RESPIRATORY_TRACT
  Filled 2020-10-09 (×8): qty 2

## 2020-10-09 MED ORDER — LEVOTHYROXINE SODIUM 50 MCG PO TABS
50.0000 ug | ORAL_TABLET | Freq: Every day | ORAL | Status: DC
  Administered 2020-10-10 – 2020-10-13 (×4): 50 ug via ORAL
  Filled 2020-10-09 (×4): qty 1

## 2020-10-09 MED ORDER — HEPARIN BOLUS VIA INFUSION
2000.0000 [IU] | Freq: Once | INTRAVENOUS | Status: AC
  Administered 2020-10-09: 2000 [IU] via INTRAVENOUS
  Filled 2020-10-09: qty 2000

## 2020-10-09 MED ORDER — GABAPENTIN 100 MG PO CAPS
100.0000 mg | ORAL_CAPSULE | Freq: Every day | ORAL | Status: DC
  Administered 2020-10-09 – 2020-10-13 (×5): 100 mg via ORAL
  Filled 2020-10-09 (×5): qty 1

## 2020-10-09 MED ORDER — DOXYCYCLINE HYCLATE 100 MG PO TABS
100.0000 mg | ORAL_TABLET | Freq: Two times a day (BID) | ORAL | Status: DC
  Administered 2020-10-10 – 2020-10-13 (×9): 100 mg via ORAL
  Filled 2020-10-09 (×9): qty 1

## 2020-10-09 MED ORDER — LACTATED RINGERS IV BOLUS
1000.0000 mL | Freq: Once | INTRAVENOUS | Status: AC
  Administered 2020-10-09: 1000 mL via INTRAVENOUS

## 2020-10-09 MED ORDER — IPRATROPIUM-ALBUTEROL 0.5-2.5 (3) MG/3ML IN SOLN
3.0000 mL | Freq: Four times a day (QID) | RESPIRATORY_TRACT | Status: DC
  Administered 2020-10-09 – 2020-10-10 (×2): 3 mL via RESPIRATORY_TRACT
  Filled 2020-10-09 (×2): qty 3

## 2020-10-09 MED ORDER — LACTATED RINGERS IV BOLUS
1500.0000 mL | Freq: Once | INTRAVENOUS | Status: AC
  Administered 2020-10-09: 500 mL via INTRAVENOUS

## 2020-10-09 MED ORDER — ACETAMINOPHEN 650 MG RE SUPP: 650.0000 mg | Freq: Four times a day (QID) | RECTAL | Status: DC | PRN

## 2020-10-09 MED ORDER — MELATONIN 5 MG PO TABS
10.0000 mg | ORAL_TABLET | Freq: Every day | ORAL | Status: DC
  Administered 2020-10-09 – 2020-10-12 (×4): 10 mg via ORAL
  Filled 2020-10-09 (×4): qty 2

## 2020-10-09 MED ORDER — ATORVASTATIN CALCIUM 10 MG PO TABS
10.0000 mg | ORAL_TABLET | Freq: Every day | ORAL | Status: DC
  Administered 2020-10-09 – 2020-10-13 (×5): 10 mg via ORAL
  Filled 2020-10-09 (×6): qty 1

## 2020-10-09 MED ORDER — PREDNISONE 20 MG PO TABS
40.0000 mg | ORAL_TABLET | Freq: Every day | ORAL | Status: DC
  Administered 2020-10-10 – 2020-10-13 (×4): 40 mg via ORAL
  Filled 2020-10-09 (×4): qty 2

## 2020-10-09 MED ORDER — SODIUM CHLORIDE 0.9 % IV SOLN
500.0000 mg | Freq: Once | INTRAVENOUS | Status: AC
  Administered 2020-10-09: 500 mg via INTRAVENOUS
  Filled 2020-10-09: qty 500

## 2020-10-09 MED ORDER — SODIUM CHLORIDE 0.9% FLUSH
3.0000 mL | Freq: Two times a day (BID) | INTRAVENOUS | Status: DC
  Administered 2020-10-10 – 2020-10-13 (×6): 3 mL via INTRAVENOUS

## 2020-10-09 MED ORDER — HEPARIN (PORCINE) 25000 UT/250ML-% IV SOLN
1300.0000 [IU]/h | INTRAVENOUS | Status: DC
  Administered 2020-10-09: 1100 [IU]/h via INTRAVENOUS
  Administered 2020-10-10: 1300 [IU]/h via INTRAVENOUS
  Filled 2020-10-09 (×2): qty 250

## 2020-10-09 MED ORDER — METHYLPREDNISOLONE SODIUM SUCC 40 MG IJ SOLR
40.0000 mg | Freq: Once | INTRAMUSCULAR | Status: AC
  Administered 2020-10-09: 40 mg via INTRAVENOUS
  Filled 2020-10-09: qty 1

## 2020-10-09 MED ORDER — SODIUM CHLORIDE 0.9 % IV SOLN
1.0000 g | Freq: Once | INTRAVENOUS | Status: AC
  Administered 2020-10-09: 1 g via INTRAVENOUS
  Filled 2020-10-09: qty 10

## 2020-10-09 MED ORDER — ARFORMOTEROL TARTRATE 15 MCG/2ML IN NEBU
15.0000 ug | INHALATION_SOLUTION | Freq: Two times a day (BID) | RESPIRATORY_TRACT | Status: DC
  Administered 2020-10-09 – 2020-10-13 (×8): 15 ug via RESPIRATORY_TRACT
  Filled 2020-10-09 (×9): qty 2

## 2020-10-09 MED ORDER — ALBUTEROL SULFATE (2.5 MG/3ML) 0.083% IN NEBU: 2.5000 mg | INHALATION_SOLUTION | RESPIRATORY_TRACT | Status: DC | PRN

## 2020-10-09 MED ORDER — SODIUM CHLORIDE 0.9 % IV SOLN
2.0000 g | INTRAVENOUS | Status: AC
  Administered 2020-10-09 – 2020-10-13 (×5): 2 g via INTRAVENOUS
  Filled 2020-10-09 (×2): qty 2
  Filled 2020-10-09: qty 20
  Filled 2020-10-09: qty 2
  Filled 2020-10-09: qty 20

## 2020-10-09 MED ORDER — TRAZODONE HCL 50 MG PO TABS
150.0000 mg | ORAL_TABLET | Freq: Every day | ORAL | Status: DC
  Administered 2020-10-09 – 2020-10-12 (×4): 150 mg via ORAL
  Filled 2020-10-09: qty 1
  Filled 2020-10-09: qty 3
  Filled 2020-10-09: qty 1
  Filled 2020-10-09: qty 2

## 2020-10-09 NOTE — Progress Notes (Signed)
ANTICOAGULATION CONSULT NOTE - Initial Consult  Pharmacy Consult for IV heparin Indication: presumed PE  Allergies  Allergen Reactions  . Ativan [Lorazepam] Other (See Comments)    Confusion   . Pitavastatin Other (See Comments)    LIVALO: reaction not recalled  . Ropinirole Nausea Only and Other (See Comments)    Makes legs jump, also  . Zofran [Ondansetron Hcl] Nausea Only  . Zolpidem Tartrate Anxiety and Other (See Comments)    CONFUSION   . Penicillins Rash and Other (See Comments)    Tolerated cephalosporins in past Has patient had a PCN reaction causing immediate rash, facial/tongue/throat swelling, SOB or lightheadedness with hypotension: Yes Has patient had a PCN reaction causing severe rash involving mucus membranes or skin necrosis: No Has patient had a PCN reaction that required hospitalization: No Has patient had a PCN reaction occurring within the last 10 years: No If all of the above answers are "NO", then may proceed with Cephalosporin use.     Patient Measurements: Height: 5\' 8"  (172.7 cm) Weight: 76 kg (167 lb 8.8 oz) IBW/kg (Calculated) : 63.9 Heparin Dosing Weight: 76 kg  Vital Signs: Temp: 98.7 F (37.1 C) (10/19 1437) Temp Source: Oral (10/19 1437) BP: 87/53 (10/19 1437) Pulse Rate: 89 (10/19 1437)  Labs: Recent Labs    10/09/20 1141  HGB 10.6*  HCT 33.5*  PLT 280  CREATININE 1.97*  TROPONINIHS 36*    Estimated Creatinine Clearance: 23 mL/min (A) (by C-G formula based on SCr of 1.97 mg/dL (H)).   Medical History: Past Medical History:  Diagnosis Date  . Anemia    bld. transfusion post lumbar surgery- 2012  . Anxiety   . Arthralgia    NOS  . Blood transfusion 2012; 02/2018   WITH BACK SURGERY; "related to hematoma in stomach" (10/06/2018)  . CAD (coronary artery disease)    s/p CABG 2004; s/p DES to LM in 2010;  [facility]rien 10/29/11: EF 50-55%, mild elevated filling pressures, no pulmonary HTN, LM 90% ISR, LAD and CFX occluded, S-RI occluded  (old), S-OM3 ok and L-LAD ok, native nondominant RCA 95% -  med rx recommended ; Lexiscan Myoview 7/13 at [facility]: demonstrated "normal LV function, anterior attenuation and localized ischemia, inferior, basilar, mid section"  . Carotid artery disease ([place])    Carotid US 1/63:  RICA 8-46; LICA 65-99; R subclavian stenosis - Repeat 1 year. // Carotid US 05/2019: R 1-39; L 40-59; R vertebral with atypical antegrade flow; R subclavian stenosis >> repeat 1 year // Carotid US 7/21: Bilat 40-59; L subclavian stenosis   . CHF (congestive heart failure) ([place]) 01/21/2020  . Chronic diastolic heart failure (HCC)    Echo 9/10: EF 35-70%, grade 1 diastolic dysfunction  . Chronic lower back pain   . COPD (chronic obstructive pulmonary disease) ([place])    Emphysema dxed by Dr. Woody Seller in [place] based on PFTs per pt in 2006; placed on albuterol  . Depression    TAKES CELEXA  AND  (OFF- WELBUTRIN)  . Diuretic-induced hypokalemia 10/08/2018  . DVT of lower extremity (deep venous thrombosis) (HCC)    recurrent. bilateral (2 episodes)  . Dyspnea    home o2 when needed  . Dysrhythmia    afib with cabg  . Exertional angina (HCC)    Treated with Isosorbide, Ranexa, amlodipine; intolerant to metoprolol  . GERD (gastroesophageal reflux disease)   . Gout    "on daily RX" (10/06/2018)  . HLD (hyperlipidemia)   . Hypertension   .  Hyperthyroidism   . L Subclavian Artery Stenosis    Carotid US 7/21   . Obesity (BMI 30-39.9) 2009   BMI 33  . Osteoarthritis    "all over; hands" (10/06/2018)  . Oxygen deficiency   . Oxygen dependent    4L at home as needed (10/06/2018)  . Pneumonia   . PVD (peripheral vascular disease) ([place])   . Sleep apnea 2012   USED CPAP THEN  STARTED USING SPIRIVA , Nov. 2013- last evaluation , changed from mask to aparatus that is just for her nose.. ; reports 05-28-18 "the mask smothers me so i dont use it right now" (10/06/2018)    Medications:  Scheduled:   Infusions:  .  azithromycin 500 mg (10/09/20 1445)    Assessment: 79 yo female presented with shortness of breath to start IV heparin for presumed PE. Will need to follow up CT scan or VQ scan results to confirm PE. Baseline CBC and SCr drawn. Patient with listed hx of DVTs of PMH but not currently on any anticoagulants.   Goal of Therapy:  Heparin level 0.3-0.7 units/ml Monitor platelets by anticoagulation protocol: Yes   Plan:  IV heparin 2000 unit bolus then rate of 1100 units/hr Check heparin level 8 hours after start of IV heparin Confirm PE after CT or VQ scan results known   Kara Mead 10/09/2020,3:21 PM

## 2020-10-09 NOTE — Consult Note (Signed)
Chief Complaint: Suspected PE  Referring Physician(s): Dr. Neysa Bonito  Supervising Physician: Corrie Mckusick  Patient Status: [facility] - ED  History of Present Illness: [name] is a 79 y.o. female presenting to the ED today at [facility] for complaints of worsening SOB and CP.   VIR was consulted with suspicion of PE.   Ms Hellberg tells me that she has had worsening SOB for about 1 week now, and the associated CP brought her in today to get evaluated.  She tells me that about 1 month ago, she was able to ambulate without symptoms, and as of the last week, she cannot make it to the mailbox comfortably.   She does have a history of thromboembolic disease, in 3564, when she was diagnosed with DVT.  She started on blood thinners, and then developed GI hemorrhage, so she tells me they stopped the blood thinners.  Since then she has had no GI hemorrhage recurrence, and she has not used AC since then.   The imaging that we have from her episode in 2019 shows that in fact it looks like she had an extraperitoneal hemorrhage, and was treated with withdrawal of the [facility] and placement of IVC filter.  Filter was placed 03/14/2018.   It does not appear that the filter was removed.    She denies any recent stroke, MI, or major surgery.  She denies any melana/BRBPR or hematemesis.   In the room, her SBP is currently 105.  Her HR is 89.  Her O2 is low 90's.    Troponin is positive.  BNP is positive.  D-Dimer is 8.76 LA is negative.   Her creatinine is 1.97.  She has chronic kidney disease.   simplifiedPESI is +1, given her prior CABG, and baseline CP disease.     Past Medical History:  Diagnosis Date  . Anemia    bld. transfusion post lumbar surgery- 2012  . Anxiety   . Arthralgia    NOS  . Blood transfusion 2012; 02/2018   WITH BACK SURGERY; "related to hematoma in stomach" (10/06/2018)  . CAD (coronary artery disease)    s/p CABG 2004; s/p DES to LM in 2010;  [place] 10/29/11: EF 50-55%, mild  elevated filling pressures, no pulmonary HTN, LM 90% ISR, LAD and CFX occluded, S-RI occluded (old), S-OM3 ok and L-LAD ok, native nondominant RCA 95% -  med rx recommended ; Lexiscan Myoview 7/13 at [facility]: demonstrated "normal LV function, anterior attenuation and localized ischemia, inferior, basilar, mid section"  . Carotid artery disease ([place])    Carotid US 4/03:  RICA 4-74; LICA 25-95; R subclavian stenosis - Repeat 1 year. // Carotid US 05/2019: R 1-39; L 40-59; R vertebral with atypical antegrade flow; R subclavian stenosis >> repeat 1 year // Carotid US 7/21: Bilat 40-59; L subclavian stenosis   . CHF (congestive heart failure) ([place]) 01/21/2020  . Chronic diastolic heart failure (HCC)    Echo 9/10: EF 63-87%, grade 1 diastolic dysfunction  . Chronic lower back pain   . COPD (chronic obstructive pulmonary disease) ([place])    Emphysema dxed by Dr. Woody Seller in [place] based on PFTs per pt in 2006; placed on albuterol  . Depression    TAKES CELEXA  AND  (OFF- WELBUTRIN)  . Diuretic-induced hypokalemia 10/08/2018  . DVT of lower extremity (deep venous thrombosis) (HCC)    recurrent. bilateral (2 episodes)  . Dyspnea    home o2 when needed  . Dysrhythmia    afib with  cabg  . Exertional angina (HCC)    Treated with Isosorbide, Ranexa, amlodipine; intolerant to metoprolol  . GERD (gastroesophageal reflux disease)   . Gout    "on daily RX" (10/06/2018)  . HLD (hyperlipidemia)   . Hypertension   . Hyperthyroidism   . L Subclavian Artery Stenosis    Carotid US 7/21   . Obesity (BMI 30-39.9) 2009   BMI 33  . Osteoarthritis    "all over; hands" (10/06/2018)  . Oxygen deficiency   . Oxygen dependent    4L at home as needed (10/06/2018)  . Pneumonia   . PVD (peripheral vascular disease) ([place])   . Sleep apnea 2012   USED CPAP THEN  STARTED USING SPIRIVA , Nov. 2013- last evaluation , changed from mask to aparatus that is just for her nose.. ; reports 05-28-18 "the mask smothers me so i  dont use it right now" (10/06/2018)    Past Surgical History:  Procedure Laterality Date  . ANKLE SURGERY  2004  . ANTERIOR CERVICAL DECOMP/DISCECTOMY FUSION  11/2007  . AUGMENTATION MAMMAPLASTY    . [place]   lower; another scheduled, opt. reports 4 back- lumbar, 3 cerv. fusions  for later 2009  . CARDIAC CATHETERIZATION  11/2009   Patent LIMA to LAD and patent SVG to OM1. Occluded SVG to ramus and diagonal. Left main: 90% ostial stenosis, LCX 60-70% proximal stenosis  . CATARACT EXTRACTION W/ INTRAOCULAR LENS  IMPLANT, BILATERAL Bilateral 2006  . COLONOSCOPY WITH PROPOFOL N/A 02/17/2017   Procedure: COLONOSCOPY WITH PROPOFOL;  Surgeon: Jerene Bears, MD;  Location: [facility] ENDOSCOPY;  Service: Endoscopy;  Laterality: N/A;  . CORONARY ANGIOPLASTY WITH STENT PLACEMENT  11/2009   Drug eluting stent to left main artery: 4.0 X 12 mm Ion   . CORONARY ARTERY BYPASS GRAFT  2004   "CABG X4"  . ESOPHAGOGASTRODUODENOSCOPY N/A 02/14/2017   Procedure: ESOPHAGOGASTRODUODENOSCOPY (EGD);  Surgeon: Doran Stabler, MD;  Location: [facility] ENDOSCOPY;  Service: Endoscopy;  Laterality: N/A;  . GIVENS CAPSULE STUDY N/A 02/15/2017   Procedure: GIVENS CAPSULE STUDY;  Surgeon: Doran Stabler, MD;  Location: [place];  Service: Endoscopy;  Laterality: N/A;  . GROIN MASS OPEN BIOPSY  2004  . IR IVC FILTER PLMT / S&I /IMG GUID/MOD SED  03/14/2018  . IR PARACENTESIS  03/10/2018  . JOINT REPLACEMENT    . LAPAROSCOPIC CHOLECYSTECTOMY    . PERIPHERAL VASCULAR INTERVENTION Left 03/05/2018   Procedure: PERIPHERAL VASCULAR INTERVENTION;  Surgeon: Elam Dutch, MD;  Location: [place] CV LAB;  Service: Cardiovascular;  Laterality: Left;  Attempted unsuccess\ful Per Dr. Eden Lathe  . PERIPHERAL VASCULAR INTERVENTION  03/08/2018   Procedure: PERIPHERAL VASCULAR INTERVENTION;  Surgeon: Waynetta Sandy, MD;  Location: [place] CV LAB;  Service: Cardiovascular;;  SMA Stent   . REPLACEMENT TOTAL KNEE  BILATERAL Bilateral 2012  . REVERSE SHOULDER ARTHROPLASTY  02/26/2012   Procedure: REVERSE SHOULDER ARTHROPLASTY;  Surgeon: Marin Shutter, MD;  Location: [place];  Service: Orthopedics;  Laterality: Right;  RIGHT SHOULDER REVERSED ARTHROPLASTY  . SHOULDER ARTHROSCOPY WITH SUBACROMIAL DECOMPRESSION Left 02/10/2013   Procedure: SHOULDER ARTHROSCOPY WITH SUBACROMIAL DECOMPRESSION DISTAL CLAVICLE RESECTION;  Surgeon: Marin Shutter, MD;  Location: [place];  Service: Orthopedics;  Laterality: Left;  DISTAL CLAVICLE RESECTION  . TONSILLECTOMY    . TOTAL HIP ARTHROPLASTY  2007   Right  . TOTAL HIP ARTHROPLASTY Left 02/07/2020   Procedure: TOTAL HIP ARTHROPLASTY ANTERIOR APPROACH;  Surgeon: Alvan Dame,  Rodman Key, MD;  Location: [facility];  Service: Orthopedics;  Laterality: Left;  70 mins  . TRANSURETHRAL RESECTION OF BLADDER TUMOR N/A 05/31/2018   Procedure: TRANSURETHRAL RESECTION OF BLADDER TUMOR (TURBT);  Surgeon: Lucas Mallow, MD;  Location: [facility] OR[facility];  Service: Urology;  Laterality: N/A;  . VISCERAL ANGIOGRAPHY N/A 03/05/2018   Procedure: VISCERAL ANGIOGRAPHY;  Surgeon: Elam Dutch, MD;  Location: [place] CV LAB;  Service: Cardiovascular;  Laterality: N/A;  . VISCERAL ANGIOGRAPHY N/A 03/08/2018   Procedure: VISCERAL ANGIOGRAPHY;  Surgeon: Waynetta Sandy, MD;  Location: [place] CV LAB;  Service: Cardiovascular;  Laterality: N/A;    Allergies: Ativan [lorazepam], Pitavastatin, Ropinirole, Zofran [ondansetron hcl], Zolpidem tartrate, and Penicillins  Medications: Prior to Admission medications   Medication Sig Start Date End Date Taking? Authorizing Provider  acetaminophen (TYLENOL) 500 MG tablet Take 2 tablets (1,000 mg total) by mouth every 8 (eight) hours. 02/08/20   Danae Orleans, PA-C  allopurinol (ZYLOPRIM) 100 MG tablet Take 100 mg by mouth daily. 01/30/17   [provider]  ARIPiprazole (ABILIFY) 2 MG tablet Take 2 mg by mouth daily. 12/29/19   [provider]    ascorbic acid (VITAMIN C) 500 MG tablet Take 500 mg by mouth daily.    [provider]  atorvastatin (LIPITOR) 20 MG tablet Take 20 mg by mouth daily. 09/21/20   [provider]  citalopram (CELEXA) 40 MG tablet Take 40 mg by mouth at bedtime. 08/30/19   [provider]  cyclobenzaprine (FLEXERIL) 10 MG tablet Take 10 mg by mouth 3 (three) times daily as needed for muscle spasms.     [provider]  docusate sodium (COLACE) 100 MG capsule Take 1 capsule (100 mg total) by mouth 2 (two) times daily. 02/08/20   Danae Orleans, PA-C  ferrous sulfate (FERROUSUL) 325 (65 FE) MG tablet Take 1 tablet (325 mg total) by mouth 3 (three) times daily with meals for 14 days. 02/08/20 02/22/20  Danae Orleans, PA-C  furosemide (LASIX) 40 MG tablet Take two tablets by mouth every Monday and Thursday afternoon.  Take one tablet all other afternoons. 03/13/20   Fay Records, MD  gabapentin (NEURONTIN) 100 MG capsule Take 100 mg by mouth daily. 01/09/20   [provider]  guaiFENesin (MUCINEX) 600 MG 12 hr tablet Take 600 mg by mouth 2 (two) times daily.     [provider]  INCRUSE ELLIPTA 62.5 MCG/INH AEPB Inhale 1 puff into the lungs 2 (two) times daily.  11/30/16   [provider]  Ipratropium-Albuterol (COMBIVENT) 20-100 MCG/ACT AERS respimat Inhale 1 puff into the lungs every 6 (six) hours as needed for wheezing or shortness of breath (or to help expel mucous).     [provider]  levothyroxine (SYNTHROID) 50 MCG tablet Take 1 tablet (50 mcg total) by mouth daily at 6 (six) AM. 09/13/20   Fay Records, MD  lisinopril (ZESTRIL) 2.5 MG tablet Take 2.5 mg by mouth daily. 09/25/20   [provider]  magnesium oxide (MAG-OX) 400 (241.3 Mg) MG tablet Take 1 tablet (400 mg total) by mouth 2 (two) times daily. 03/26/18   Mikhail, Velta Addison, DO  Melatonin 10 MG TABS Take 10 mg by mouth at bedtime.     [provider]  metolazone (ZAROXOLYN)  2.5 MG tablet  08/22/20   [provider]  neomycin-polymyxin-hydrocortisone (CORTISPORIN) 3.5-10000-1 OTIC suspension Place 5 drops into the right ear daily as needed (ear pain).  08/31/19  [provider]  nitroGLYCERIN (NITROSTAT) 0.4 MG SL tablet Place 1 tablet (0.4 mg total) under the tongue every 5 (five) minutes as needed for chest pain. For chest pain 04/24/16   Fay Records, MD  nystatin (MYCOSTATIN) 100000 UNIT/ML suspension  08/14/20   [provider]  ofloxacin (FLOXIN) 0.3 % OTIC solution  04/13/20   [provider]  omeprazole (PRILOSEC) 40 MG capsule Take 1 capsule (40 mg total) by mouth 2 (two) times daily. 12/19/19   Esterwood, Amy S, PA-C  oxyCODONE 10 MG TABS Take 1-2 tablets (10-20 mg total) by mouth every 4 (four) hours as needed for moderate pain or severe pain. 02/08/20   Danae Orleans, PA-C  OXYGEN Inhale 4 L/min into the lungs as needed (DURING ALL TIMES OF EXERTION).     [provider]  pilocarpine (PILOCAR) 1 % ophthalmic solution Place 1 drop into both eyes 2 (two) times daily.    [provider]  polyethylene glycol (MIRALAX / GLYCOLAX) 17 g packet Take 17 g by mouth 2 (two) times daily. 02/08/20   Danae Orleans, PA-C  potassium chloride (KLOR-CON) 10 MEQ tablet Take 2 tablets (20 mEq total) by mouth daily. Take 2 additional tablets every Monday and Thursday. 03/13/20   Fay Records, MD  pramipexole (MIRAPEX) 0.25 MG tablet  08/22/20   [provider]  prednisoLONE acetate (PRED FORTE) 1 % ophthalmic suspension  04/13/20   [provider]  predniSONE (DELTASONE) 20 MG tablet  08/03/20   [provider]  rivaroxaban (XARELTO) 10 MG TABS tablet Take 1 tablet (10 mg total) by mouth daily for 14 days. 02/09/20 02/23/20  Danae Orleans, PA-C  roflumilast (DALIRESP) 500 MCG TABS tablet Take 500 mcg by mouth every evening.     [provider]  SPIRIVA RESPIMAT 1.25 MCG/ACT AERS  08/24/20   [provider]  spironolactone (ALDACTONE) 25 MG tablet TAKE 1 TABLET BY MOUTH DAILY 11/25/19   Fay Records, MD  traZODone (DESYREL) 50 MG tablet Take 150 mg by mouth at bedtime.  10/07/18   [provider]  XTAMPZA ER 9 MG C12A Take 9 mg by mouth every 8 (eight) hours. 09/25/20   [provider]     Family History  Problem Relation Age of Onset  . Ovarian cancer Mother   . Lung cancer Father   . COPD Father   . Heart disease Father   . Heart disease Sister   . Stomach cancer Sister   . Colitis Sister     Social History   Socioeconomic History  . Marital status: Widowed    Spouse name: Not on file  . Number of children: 3  . Years of education: Not on file  . Highest education level: Not on file  Occupational History  . Occupation: retired    Comment: Electronics engineer: RETIRED  Tobacco Use  . Smoking status: Former Smoker    Packs/day: 1.00    Years: 45.00    Pack years: 45.00    Types: Cigarettes    Quit date: 12/22/2000    Years since quitting: 19.8  . Smokeless tobacco: Never Used  Vaping Use  . Vaping Use: Never used  Substance and Sexual Activity  . Alcohol use: Never    Alcohol/week: 0.0 standard drinks  . Drug use: Never  . Sexual activity: Not Currently  Other Topics Concern  . Not on file  Social History Narrative  3 grown children.   Social Determinants of Health   Financial Resource Strain:   . Difficulty of Paying Living Expenses: Not on file  Food Insecurity: No Food Insecurity  . Worried About Charity fundraiser in the Last Year: Never true  . Ran Out of Food in the Last Year: Never true  Transportation Needs: No Transportation Needs  . Lack of Transportation (Medical): No  . Lack of Transportation (Non-Medical): No  Physical Activity:   . Days of Exercise per Week: Not on file  . Minutes of Exercise per Session: Not on file  Stress:   . Feeling of Stress : Not on file  Social Connections:   . Frequency of  Communication with Friends and Family: Not on file  . Frequency of Social Gatherings with Friends and Family: Not on file  . Attends Religious Services: Not on file  . Active Member of Clubs or Organizations: Not on file  . Attends Archivist Meetings: Not on file  . Marital Status: Not on file       Review of Systems: A 12 point ROS discussed and pertinent positives are indicated in the HPI above.  All other systems are negative.  Review of Systems  Vital Signs: BP (!) 102/52   Pulse 86   Temp 98.7 F (37.1 C) (Oral)   Resp 19   Ht 5\' 8"  (1.727 m)   Wt 76 kg   SpO2 97%   BMI 25.48 kg/m   Physical Exam General: 79 yo female appearing stated age.  Well-developed, well-nourished.   HEENT: Atraumatic, normocephalic.  Conjugate gaze, extra-ocular motor intact. No scleral icterus or scleral injection. No lesions on external ears, nose, lips, or gums.  Oral mucosa moist, pink.  Neck: Symmetric with no goiter enlargement.  Chest/Lungs:  Symmetric chest with inspiration/expiration.  No labored breathing.  Clear to auscultation with no wheezes, rhonchi, or rales.  Scar of midline sternotomy.  Heart:  RRR, with no third heart sounds appreciated. No JVD appreciated.  Abdomen:  Soft, NT/ND, with + bowel sounds.   Genito-urinary: Deferred Neurologic: Alert & Oriented to person, place, and time.   Normal affect and insight.  Appropriate questions.  Moving all 4 extremities with gross sensory intact.   Extremities: No wound.  No asymmetric swelling.  Non-pitting edema bilateral.  Warm extremities bilateral.  No erythema.     Imaging: DG Chest Port 1 View  Result Date: 10/09/2020 CLINICAL DATA:  Shortness of breath, cough and fever. EXAM: PORTABLE CHEST 1 VIEW COMPARISON:  10/05/2019 FINDINGS: Previous median sternotomy and CABG. Mild cardiomegaly. Aortic atherosclerosis. Bilateral breast implants with density overlying the chest. Suspicion of bronchitis and hazy pneumonia in the  mid and lower lungs. No dense consolidation, lobar collapse or effusion. Old shoulder replacement on the right. Chronic degenerative changes of the left shoulder. IMPRESSION: 1. Suspicion of bronchitis and hazy pneumonia in the mid and lower lungs. No dense consolidation or lobar collapse. 2. Previous CABG. Mild cardiomegaly. Aortic atherosclerosis. Electronically Signed   By: Nelson Chimes M.D.   On: 10/09/2020 13:15   ECHOCARDIOGRAM COMPLETE  Result Date: 10/09/2020    ECHOCARDIOGRAM REPORT   Patient Name:   DIYA GERVASI Date of Exam: 10/09/2020 Medical Rec #:  [id number]      Height:       68.0 in Accession #:    [id number]     Weight:       167.5 lb Date of Birth:  06/17/1941  BSA:          1.896 m Patient Age:    66 years       BP:           92/42 mmHg Patient Gender: F              HR:           88 bpm. Exam Location:  Inpatient Procedure: Cardiac Doppler, Color Doppler, 2D Echo and 3D Echo STAT ECHO Indications:    R07.9* Chest pain, unspecified  History:        Patient has prior history of Echocardiogram examinations, most                 recent 10/05/2019. CAD, Abnormal ECG and Prior CABG, COPD,                 Signs/Symptoms:Dyspnea; Risk Factors:Sleep Apnea and                 Dyslipidemia. Hypoxia. Pulmonary embolus.  Sonographer:    Roseanna Rainbow RDCS Referring Phys: [id number] [place]  1. Diastology suggestive of impaired LV filling. Left ventricular ejection fraction, by estimation, is 60 to 65%. The left ventricle has normal function. The left ventricle has no regional wall motion abnormalities. There is moderate concentric left ventricular hypertrophy. Left ventricular diastolic parameters are indeterminate.  2. Right ventricular systolic function is low normal. The right ventricular size is normal. There is normal pulmonary artery systolic pressure.  3. Left atrial size was moderately dilated.  4. Right atrial size was moderately dilated.  5. The mitral valve is  myxomatous. No evidence of mitral valve regurgitation. The mean mitral valve gradient is 5.0 mmHg with average heart rate of 87 bpm. Moderate mitral annular calcification.  6. The aortic valve is tricuspid. Aortic valve regurgitation is not visualized.  7. The inferior vena cava is dilated in size with <50% respiratory variability, suggesting right atrial pressure of 15 mmHg. Comparison(s): A prior study was performed on 10/05/2019. No significant change from prior study. Subtle decrease in RV function. FINDINGS  Left Ventricle: Diastology suggestive of impaired LV filling. Left ventricular ejection fraction, by estimation, is 60 to 65%. The left ventricle has normal function. The left ventricle has no regional wall motion abnormalities. The left ventricular internal cavity size was normal in size. There is moderate concentric left ventricular hypertrophy. Left ventricular diastolic parameters are indeterminate. Right Ventricle: The right ventricular size is normal. Right vetricular wall thickness was not well visualized. Right ventricular systolic function is low normal. There is normal pulmonary artery systolic pressure. The tricuspid regurgitant velocity is 2.30 m/s, and with an assumed right atrial pressure of 3 mmHg, the estimated right ventricular systolic pressure is 81.1 mmHg. Left Atrium: Left atrial size was moderately dilated. Right Atrium: Right atrial size was moderately dilated. Pericardium: There is no evidence of pericardial effusion. Mitral Valve: The mitral valve is myxomatous. There is mild calcification of the mitral valve leaflet(s). Moderate mitral annular calcification. No evidence of mitral valve regurgitation. MV peak gradient, 25.4 mmHg. The mean mitral valve gradient is 5.0  mmHg with average heart rate of 87 bpm. Tricuspid Valve: The tricuspid valve is grossly normal. Tricuspid valve regurgitation is trivial. Aortic Valve: The aortic valve is tricuspid. Aortic valve regurgitation is not  visualized. Pulmonic Valve: The pulmonic valve was not well visualized. Pulmonic valve regurgitation is trivial. Aorta: The aortic root and ascending aorta are structurally normal, with no evidence of dilitation.  Venous: The inferior vena cava is dilated in size with less than 50% respiratory variability, suggesting right atrial pressure of 15 mmHg. IAS/Shunts: The interatrial septum appears to be lipomatous. The atrial septum is grossly normal.  LEFT VENTRICLE PLAX 2D LVIDd:         4.60 cm     Diastology LVIDs:         3.00 cm     LV e' medial:    5.87 cm/s LV PW:         1.40 cm     LV E/e' medial:  27.1 LV IVS:        1.40 cm     LV e' lateral:   8.21 cm/s LVOT diam:     1.70 cm     LV E/e' lateral: 19.4 LV SV:         62 LV SV Index:   33 LVOT Area:     2.27 cm  LV Volumes (MOD) LV vol d, MOD A2C: 99.9 ml LV vol d, MOD A4C: 71.4 ml LV vol s, MOD A2C: 35.9 ml LV vol s, MOD A4C: 26.8 ml LV SV MOD A2C:     64.0 ml LV SV MOD A4C:     71.4 ml LV SV MOD BP:      56.6 ml RIGHT VENTRICLE            IVC RV S prime:     8.08 cm/s  IVC diam: 3.00 cm TAPSE (M-mode): 1.4 cm LEFT ATRIUM             Index       RIGHT ATRIUM           Index LA diam:        3.80 cm 2.00 cm/m  RA Area:     17.30 cm LA Vol (A2C):   56.3 ml 29.70 ml/m RA Volume:   44.90 ml  23.69 ml/m LA Vol (A4C):   44.9 ml 23.69 ml/m LA Biplane Vol: 55.6 ml 29.33 ml/m  AORTIC VALVE LVOT Vmax:   129.00 cm/s LVOT Vmean:  86.900 cm/s LVOT VTI:    0.272 m  AORTA Ao Root diam: 3.40 cm Ao Asc diam:  3.40 cm MITRAL VALVE                TRICUSPID VALVE MV Area (PHT): 4.15 cm     TR Peak grad:   21.2 mmHg MV Peak grad:  25.4 mmHg    TR Vmax:        230.00 cm/s MV Mean grad:  5.0 mmHg MV Vmax:       2.52 m/s     SHUNTS MV Vmean:      88.8 cm/s    Systemic VTI:  0.27 m MV Decel Time: 183 msec     Systemic Diam: 1.70 cm MV E velocity: 159.00 cm/s MV A velocity: 209.00 cm/s MV E/A ratio:  0.76 Rudean Haskell MD Electronically signed by Rudean Haskell MD  Signature Date/Time: 10/09/2020/4:38:55 PM    Final     Labs:  CBC: Recent Labs    02/08/20 0351 03/08/20 1158 09/10/20 1535 10/09/20 1141  WBC 10.2 8.1 10.0 18.0*  HGB 10.2* 9.6* 12.6 10.6*  HCT 33.4* 29.6* 38.8 33.5*  PLT 199 324 245 280    COAGS: Recent Labs    01/31/20 1500  INR 1.0    BMP: Recent Labs    01/31/20 1500 01/31/20 1500 02/08/20 0351 03/08/20 1158 09/10/20 1535 10/09/20  1141  NA 139   < > 137 141 142 138  K 3.6   < > 4.8 3.6 4.1 4.1  CL 97*   < > 104 100 98 97*  CO2 32   < > 25 25 31* 28  GLUCOSE 115*   < > 147* 99 123* 117*  BUN 39*   < > 37* 29* 28* 46*  CALCIUM 9.3   < > 8.3* 9.0 9.7 9.2  CREATININE 1.73*   < > 1.66* 1.47* 1.19* 1.97*  GFRNONAA 28*   < > 29* 34* 43* 23*  GFRAA 32*  --  34* 39* 50*  --    < > = values in this interval not displayed.    LIVER FUNCTION TESTS: Recent Labs    01/31/20 1500  BILITOT 0.4  AST 18  ALT 12  ALKPHOS 78  PROT 7.3  ALBUMIN 3.8    TUMOR MARKERS: No results for input(s): AFPTM, CEA, CA199, CHROMGRNA in the last 8760 hours.  Assessment and Plan:  Ms Santillo is 61 female with suspected recurrent PE, now on therapeutic heparin ggt.   She has not yet had diagnostic imaging to confirm PE, as she has elevated creatinine 1.9.  If in fact she has PE, this would be submassive category.    I had a lengthy discussion with her and her husband regarding the diagnosis, the treatment possibilities, which might include catheter directed lysis and/or mechanical thrombectomy, or simply blood thinners.   I did let them know that while not significant risk, catheter directed lysis would be small risk given her history of bleeding on AC.  We need to weight the benefit of therapy with this risk, and certainly would not proceed without diagnosis of PE.  This would require CTA chest, PE protocol.  At this point we are available to reassess should there be any change, as well as review the diagnostic imaging as it  is performed.  Until then, we agree with anti-coagulation/standard of care.   Call with any questions/concerns. .    Discussed with Dr. Neysa Bonito.   Thank you for this interesting consult.  I greatly enjoyed meeting QUINTANA CANELO and look forward to participating in their care.  A copy of this report was sent to the requesting provider on this date.  Electronically Signed: Corrie Mckusick, DO 10/09/2020, 5:45 PM   I spent a total of 55 Mi[place]utes    in face to face in clinical consultation, greater than 50% of which was counseling/coordinating care for possible PE, possible PE lysis.

## 2020-10-09 NOTE — H&P (Addendum)
History and Physical        Hospital Admission Note Date: 10/09/2020  Patient name: [name] Medical record number: [id number] Date of birth: 01/31/1941 Age: 79 y.o. Gender: female  PCP: [name], MD  Patient coming from: Home, presented from urgent care   Chief Complaint    Chief Complaint  Patient presents with  . 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 urgent care (UC) complaining of shortness of breath x1 week.  Patient was seen in the urgent care 1 week ago for her cough and shortness of breath and CXR showed infiltrates and she was started on antibiotics.  She returned to the UC with worsening cough and shortness of breath and fever 100.21F at home.  She was having trouble with her O2 tank at home and was out of O2 and so she was noted to be 72% on room air at [facility]  Covid test was negative on both occasions.   States that she typically uses her O2 intermittently but has been requiring constant supplemental O2 over the past week.  Also has had some chest pain and has been taking so much nitroglycerin that she has run out of it in the past 24 hours.  Also admits to orthopnea and lower extremity edema.  ED Course: Afebrile, tachycardic, tachypneic, hypotensive (SBP 80s and 90s despite IV fluids) requiring 4 L/min.  Notable labs: Glucose 117, BUN 46, creatinine 1.97 (previously 1.19 on 09/10/2020), troponin 36, WBC 18.0, Hb 10.6, COVID-19 negative.   CXR with suspicion of bronchitis and hazy pneumonia in the mid and lower lungs with no dense consolidation or lobar collapse.  She was given azithromycin and ceftriaxone for CAP therapy and IV fluids and hospitalist was asked to admit.  Vitals:   10/09/20 1619 10/09/20 1704  BP:  (!) 110/58  Pulse: 89 88  Resp: 20 20  Temp:    SpO2: 96% 92%     Review of Systems:  Review of Systems  Constitutional: Positive for fever and malaise/fatigue.  HENT: Negative.   Respiratory: Positive for cough, shortness of breath and wheezing.   Cardiovascular: Positive for chest pain, orthopnea and leg swelling.  Gastrointestinal: Negative for nausea and vomiting.  Genitourinary: Negative.   Musculoskeletal: Negative.   All other systems reviewed and are negative.   Medical/Social/Family History   Past Medical History: Past Medical History:  Diagnosis Date  . Anemia    bld. transfusion post lumbar surgery- 2012  . Anxiety   . Arthralgia    NOS  . Blood transfusion 2012; 02/2018   WITH BACK SURGERY; "related to hematoma in stomach" (10/06/2018)  . CAD (coronary artery disease)    s/p CABG 2004; s/p DES to LM in 2010;  LHC 10/29/11: EF 50-55%, mild elevated filling pressures, no pulmonary HTN, LM  90% ISR, LAD and CFX occluded, S-RI occluded (old), S-OM3 ok and L-LAD ok, native nondominant RCA 95% -  med rx recommended ; Lexiscan Myoview 7/13 at [facility]: demonstrated "normal LV function, anterior attenuation and localized ischemia, inferior, basilar, mid section"  . Carotid artery disease ([place])    Carotid US 8/52:  RICA 7-78; LICA 24-23; R subclavian stenosis - Repeat 1 year. // Carotid US 05/2019: R 1-39; L 40-59; R vertebral with atypical antegrade flow; R subclavian stenosis >> repeat 1 year // Carotid US 7/21: Bilat 40-59; L subclavian stenosis   . CHF (congestive heart failure) ([place]) 01/21/2020  . Chronic diastolic heart failure (HCC)    Echo 9/10: EF 53-61%, grade 1 diastolic dysfunction    . Chronic lower back pain   . COPD (chronic obstructive pulmonary disease) ([place])    Emphysema dxed by Dr. Woody Seller in [place] based on PFTs per pt in 2006; placed on albuterol  . Depression    TAKES CELEXA  AND  (OFF- WELBUTRIN)  . Diuretic-induced hypokalemia 10/08/2018  . DVT of lower extremity (deep venous thrombosis) (HCC)    recurrent. bilateral (2 episodes)  . Dyspnea    home o2 when needed  . Dysrhythmia    afib with cabg  . Exertional angina (HCC)    Treated with Isosorbide, Ranexa, amlodipine; intolerant to metoprolol  . GERD (gastroesophageal reflux disease)   . Gout    "on daily RX" (10/06/2018)  . HLD (hyperlipidemia)   . Hypertension   . Hyperthyroidism   . L Subclavian Artery Stenosis    Carotid US 7/21   . Obesity (BMI 30-39.9) 2009   BMI 33  . Osteoarthritis    "all over; hands" (10/06/2018)  . Oxygen deficiency   . Oxygen dependent    4L at home as needed (10/06/2018)  . Pneumonia   . PVD (peripheral vascular disease) ([place])   . Sleep apnea 2012   USED CPAP THEN  STARTED USING SPIRIVA , Nov. 2013- last evaluation , changed from mask to aparatus that is just for her nose.. ; reports 05-28-18 "the mask smothers me so i dont use it right now" (10/06/2018)    Past Surgical History:  Procedure Laterality Date  . ANKLE SURGERY  2004  . ANTERIOR CERVICAL DECOMP/DISCECTOMY FUSION  11/2007  . AUGMENTATION MAMMAPLASTY    . [place]   lower; another scheduled, opt. reports 4 back- lumbar, 3 cerv. fusions  for later 2009  . CARDIAC CATHETERIZATION  11/2009   Patent LIMA to LAD and patent SVG to OM1. Occluded SVG to ramus and diagonal. Left main: 90% ostial stenosis, LCX 60-70% proximal stenosis  . CATARACT EXTRACTION W/ INTRAOCULAR LENS  IMPLANT, BILATERAL Bilateral 2006  . COLONOSCOPY WITH PROPOFOL N/A 02/17/2017   Procedure: COLONOSCOPY WITH PROPOFOL;  Surgeon: Jerene Bears, MD;  Location: [facility] ENDOSCOPY;  Service: Endoscopy;  Laterality: N/A;  . CORONARY  ANGIOPLASTY WITH STENT PLACEMENT  11/2009   Drug eluting stent to left main artery: 4.0 X 12 mm Ion   . CORONARY ARTERY BYPASS GRAFT  2004   "CABG X4"  . ESOPHAGOGASTRODUODENOSCOPY N/A 02/14/2017   Procedure: ESOPHAGOGASTRODUODENOSCOPY (EGD);  Surgeon: Doran Stabler, MD;  Location: [facility] ENDOSCOPY;  Service: Endoscopy;  Laterality: N/A;  . GIVENS CAPSULE STUDY N/A 02/15/2017   Procedure: GIVENS CAPSULE STUDY;  Surgeon: Doran Stabler, MD;  Location: [place];  Service: Endoscopy;  Laterality: N/A;  . GROIN MASS OPEN BIOPSY  2004  .  IR IVC FILTER PLMT / S&I /IMG GUID/MOD SED  03/14/2018  . IR PARACENTESIS  03/10/2018  . JOINT REPLACEMENT    . LAPAROSCOPIC CHOLECYSTECTOMY    . PERIPHERAL VASCULAR INTERVENTION Left 03/05/2018   Procedure: PERIPHERAL VASCULAR INTERVENTION;  Surgeon: Elam Dutch, MD;  Location: [place] CV LAB;  Service: Cardiovascular;  Laterality: Left;  Attempted unsuccess\ful Per Dr. Eden Lathe  . PERIPHERAL VASCULAR INTERVENTION  03/08/2018   Procedure: PERIPHERAL VASCULAR INTERVENTION;  Surgeon: Waynetta Sandy, MD;  Location: [place] CV LAB;  Service: Cardiovascular;;  SMA Stent   . REPLACEMENT TOTAL KNEE BILATERAL Bilateral 2012  . REVERSE SHOULDER ARTHROPLASTY  02/26/2012   Procedure: REVERSE SHOULDER ARTHROPLASTY;  Surgeon: Marin Shutter, MD;  Location: [place];  Service: Orthopedics;  Laterality: Right;  RIGHT SHOULDER REVERSED ARTHROPLASTY  . SHOULDER ARTHROSCOPY WITH SUBACROMIAL DECOMPRESSION Left 02/10/2013   Procedure: SHOULDER ARTHROSCOPY WITH SUBACROMIAL DECOMPRESSION DISTAL CLAVICLE RESECTION;  Surgeon: Marin Shutter, MD;  Location: [place];  Service: Orthopedics;  Laterality: Left;  DISTAL CLAVICLE RESECTION  . TONSILLECTOMY    . TOTAL HIP ARTHROPLASTY  2007   Right  . TOTAL HIP ARTHROPLASTY Left 02/07/2020   Procedure: TOTAL HIP ARTHROPLASTY ANTERIOR APPROACH;  Surgeon: Paralee Cancel, MD;  Location: [facility];  Service: Orthopedics;  Laterality:  Left;  70 mins  . TRANSURETHRAL RESECTION OF BLADDER TUMOR N/A 05/31/2018   Procedure: TRANSURETHRAL RESECTION OF BLADDER TUMOR (TURBT);  Surgeon: Lucas Mallow, MD;  Location: [facility];  Service: Urology;  Laterality: N/A;  . VISCERAL ANGIOGRAPHY N/A 03/05/2018   Procedure: VISCERAL ANGIOGRAPHY;  Surgeon: Elam Dutch, MD;  Location: [place] CV [facility];  Service: Cardiovascular;  Laterality: N/A;  . VISCERAL ANGIOGRAPHY N/A 03/08/2018   Procedure: VISCERAL ANGIOGRAPHY;  Surgeon: Waynetta Sandy, MD;  Location: [place] CV LAB;  Service: Cardiovascular;  Laterality: N/A;    Medications: Prior to Admission medications   Medication Sig Start Date End Date Taking? Authorizing Provider  acetaminophen (TYLENOL) 500 MG tablet Take 2 tablets (1,000 mg total) by mouth every 8 (eight) hours. 02/08/20   Danae Orleans, PA-C  allopurinol (ZYLOPRIM) 100 MG tablet Take 100 mg by mouth daily. 01/30/17   [provider]  ARIPiprazole (ABILIFY) 2 MG tablet Take 2 mg by mouth daily. 12/29/19   [provider]  ascorbic acid (VITAMIN C) 500 MG tablet Take 500 mg by mouth daily.    [provider]  atorvastatin (LIPITOR) 10 MG tablet Take 10 mg by mouth daily.  10/24/19   [provider]  citalopram (CELEXA) 40 MG tablet Take 40 mg by mouth at bedtime. 08/30/19   [provider]  cyclobenzaprine (FLEXERIL) 10 MG tablet Take 10 mg by mouth 3 (three) times daily as needed for muscle spasms.     [provider]  docusate sodium (COLACE) 100 MG capsule Take 1 capsule (100 mg total) by mouth 2 (two) times daily. 02/08/20   Danae Orleans, PA-C  ferrous sulfate (FERROUSUL) 325 (65 FE) MG tablet Take 1 tablet (325 mg total) by mouth 3 (three) times daily with meals for 14 days. 02/08/20 02/22/20  Danae Orleans, PA-C  furosemide (LASIX) 40 MG tablet Take two tablets by mouth every Monday and Thursday afternoon.  Take one tablet all other afternoons. 03/13/20    Fay Records, MD  gabapentin (NEURONTIN) 100 MG capsule Take 100 mg by mouth daily. 01/09/20   [provider]  guaiFENesin (MUCINEX) 600 MG 12 hr tablet  Take 600 mg by mouth 2 (two) times daily.     [provider]  INCRUSE ELLIPTA 62.5 MCG/INH AEPB Inhale 1 puff into the lungs 2 (two) times daily.  11/30/16   [provider]  Ipratropium-Albuterol (COMBIVENT) 20-100 MCG/ACT AERS respimat Inhale 1 puff into the lungs every 6 (six) hours as needed for wheezing or shortness of breath (or to help expel mucous).     [provider]  levothyroxine (SYNTHROID) 50 MCG tablet Take 1 tablet (50 mcg total) by mouth daily at 6 (six) AM. 09/13/20   Fay Records, MD  magnesium oxide (MAG-OX) 400 (241.3 Mg) MG tablet Take 1 tablet (400 mg total) by mouth 2 (two) times daily. 03/26/18   Mikhail, Velta Addison, DO  Melatonin 10 MG TABS Take 10 mg by mouth at bedtime.     [provider]  metolazone (ZAROXOLYN) 2.5 MG tablet  08/22/20   [provider]  neomycin-polymyxin-hydrocortisone (CORTISPORIN) 3.5-10000-1 OTIC suspension Place 5 drops into the right ear daily as needed (ear pain).  08/31/19   [provider]  nitroGLYCERIN (NITROSTAT) 0.4 MG SL tablet Place 1 tablet (0.4 mg total) under the tongue every 5 (five) minutes as needed for chest pain. For chest pain 04/24/16   Fay Records, MD  nystatin (MYCOSTATIN) 100000 UNIT/ML suspension  08/14/20   [provider]  ofloxacin (FLOXIN) 0.3 % OTIC solution  04/13/20   [provider]  omeprazole (PRILOSEC) 40 MG capsule Take 1 capsule (40 mg total) by mouth 2 (two) times daily. 12/19/19   Esterwood, Amy S, PA-C  oxyCODONE 10 MG TABS Take 1-2 tablets (10-20 mg total) by mouth every 4 (four) hours as needed for moderate pain or severe pain. 02/08/20   Danae Orleans, PA-C  OXYGEN Inhale 4 L/min into the lungs as needed (DURING ALL TIMES OF EXERTION).     [provider]  pilocarpine  (PILOCAR) 1 % ophthalmic solution Place 1 drop into both eyes 2 (two) times daily.    [provider]  polyethylene glycol (MIRALAX / GLYCOLAX) 17 g packet Take 17 g by mouth 2 (two) times daily. 02/08/20   Danae Orleans, PA-C  potassium chloride (KLOR-CON) 10 MEQ tablet Take 2 tablets (20 mEq total) by mouth daily. Take 2 additional tablets every Monday and Thursday. 03/13/20   Fay Records, MD  pramipexole (MIRAPEX) 0.25 MG tablet  08/22/20   [provider]  prednisoLONE acetate (PRED FORTE) 1 % ophthalmic suspension  04/13/20   [provider]  predniSONE (DELTASONE) 20 MG tablet  08/03/20   [provider]  rivaroxaban (XARELTO) 10 MG TABS tablet Take 1 tablet (10 mg total) by mouth daily for 14 days. 02/09/20 02/23/20  Danae Orleans, PA-C  roflumilast (DALIRESP) 500 MCG TABS tablet Take 500 mcg by mouth every evening.     [provider]  SPIRIVA RESPIMAT 1.25 MCG/ACT AERS  08/24/20   [provider]  spironolactone (ALDACTONE) 25 MG tablet TAKE 1 TABLET BY MOUTH DAILY 11/25/19   Fay Records, MD  traZODone (DESYREL) 50 MG tablet Take 150 mg by mouth at bedtime.  10/07/18   [provider]    Allergies:   Allergies  Allergen Reactions  . Ativan [Lorazepam] Other (See Comments)    Confusion   . Pitavastatin Other (See Comments)    LIVALO: reaction not recalled  . Ropinirole Nausea Only and Other (See Comments)    Makes legs jump, also  . Zofran Alvis Lemmings  Hcl] Nausea Only  . Zolpidem Tartrate Anxiety and Other (See Comments)    CONFUSION   . Penicillins Rash and Other (See Comments)    Tolerated cephalosporins in past Has patient had a PCN reaction causing immediate rash, facial/tongue/throat swelling, SOB or lightheadedness with hypotension: Yes Has patient had a PCN reaction causing severe rash involving mucus membranes or skin necrosis: No Has patient had a PCN reaction that required hospitalization: No Has patient had  a PCN reaction occurring within the last 10 years: No If all of the above answers are "NO", then may proceed with Cephalosporin use.     Social History:  reports that she quit smoking about 19 years ago. Her smoking use included cigarettes. She has a 45.00 pack-year smoking history. She has never used smokeless tobacco. She reports that she does not drink alcohol and does not use drugs.  Family History: Family History  Problem Relation Age of Onset  . Ovarian cancer Mother   . Lung cancer Father   . COPD Father   . Heart disease Father   . Heart disease Sister   . Stomach cancer Sister   . Colitis Sister      Objective   Physical Exam: Blood pressure (!) 110/58, pulse 88, temperature 98.7 F (37.1 C), temperature source Oral, resp. rate 20, height 5\' 8"  (1.727 m), weight 76 kg, SpO2 92 %.  Physical Exam Vitals and nursing note reviewed.  Constitutional:      Appearance: She is ill-appearing.  HENT:     Head: Normocephalic.  Cardiovascular:     Rate and Rhythm: Regular rhythm. Tachycardia present.  Extrasystoles are present.    Heart sounds: No murmur heard.  No gallop.   Pulmonary:     Effort: Tachypnea present.     Breath sounds: Wheezing and rales present.  Abdominal:     General: Bowel sounds are normal.     Palpations: Abdomen is soft. There is no hepatomegaly.  Musculoskeletal:     Right lower leg: No tenderness.     Left lower leg: No tenderness.     Comments: Trace edema lower extremities  Skin:    Coloration: Skin is not cyanotic or pale.  Neurological:     General: No focal deficit present.     Mental Status: She is alert and oriented to person, place, and time.  Psychiatric:        Mood and Affect: Mood normal.        Behavior: Behavior normal.     LABS on Admission: I have personally reviewed all the labs and imaging below    Basic Metabolic Panel: Recent Labs  Lab 10/09/20 1141  NA 138  K 4.1  CL 97*  CO2 28  GLUCOSE 117*  BUN 46*    CREATININE 1.97*  CALCIUM 9.2   Liver Function Tests: No results for input(s): AST, ALT, ALKPHOS, BILITOT, PROT, ALBUMIN in the last 168 hours. No results for input(s): LIPASE, AMYLASE in the last 168 hours. No results for input(s): AMMONIA in the last 168 hours. CBC: Recent Labs  Lab 10/09/20 1141  WBC 18.0*  HGB 10.6*  HCT 33.5*  MCV 93.8  PLT 280   Cardiac Enzymes: No results for input(s): CKTOTAL, CKMB, CKMBINDEX, TROPONINI in the last 168 hours. BNP: Invalid input(s): POCBNP CBG: No results for input(s): GLUCAP in the last 168 hours.  Radiological Exams on Admission:  DG Chest Port 1 View  Result Date: 10/09/2020 CLINICAL DATA:  Shortness of breath, cough  and fever. EXAM: PORTABLE CHEST 1 VIEW COMPARISON:  10/05/2019 FINDINGS: Previous median sternotomy and CABG. Mild cardiomegaly. Aortic atherosclerosis. Bilateral breast implants with density overlying the chest. Suspicion of bronchitis and hazy pneumonia in the mid and lower lungs. No dense consolidation, lobar collapse or effusion. Old shoulder replacement on the right. Chronic degenerative changes of the left shoulder. IMPRESSION: 1. Suspicion of bronchitis and hazy pneumonia in the mid and lower lungs. No dense consolidation or lobar collapse. 2. Previous CABG. Mild cardiomegaly. Aortic atherosclerosis. Electronically Signed   By: Nelson Chimes M.D.   On: 10/09/2020 13:15   ECHOCARDIOGRAM COMPLETE  Result Date: 10/09/2020    ECHOCARDIOGRAM REPORT   Patient Name:   KAYTON RIPP Date of Exam: 10/09/2020 Medical Rec #:  [id number]      Height:       68.0 in Accession #:    [id number]     Weight:       167.5 lb Date of Birth:  [birth year]       BSA:          1.896 m Patient Age:    54 years       BP:           92/42 mmHg Patient Gender: F              HR:           88 bpm. Exam Location:  Inpatient Procedure: Cardiac Doppler, Color Doppler, 2D Echo and 3D Echo STAT ECHO Indications:    R07.9* Chest pain, unspecified  History:         Patient has prior history of Echocardiogram examinations, most                 recent 10/05/2019. CAD, Abnormal ECG and Prior CABG, COPD,                 Signs/Symptoms:Dyspnea; Risk Factors:Sleep Apnea and                 Dyslipidemia. Hypoxia. Pulmonary embolus.  Sonographer:    Roseanna Rainbow RDCS Referring Phys: [id number] [place]  1. Diastology suggestive of impaired LV filling. Left ventricular ejection fraction, by estimation, is 60 to 65%. The left ventricle has normal function. The left ventricle has no regional wall motion abnormalities. There is moderate concentric left ventricular hypertrophy. Left ventricular diastolic parameters are indeterminate.  2. Right ventricular systolic function is low normal. The right ventricular size is normal. There is normal pulmonary artery systolic pressure.  3. Left atrial size was moderately dilated.  4. Right atrial size was moderately dilated.  5. The mitral valve is myxomatous. No evidence of mitral valve regurgitation. The mean mitral valve gradient is 5.0 mmHg with average heart rate of 87 bpm. Moderate mitral annular calcification.  6. The aortic valve is tricuspid. Aortic valve regurgitation is not visualized.  7. The inferior vena cava is dilated in size with <50% respiratory variability, suggesting right atrial pressure of 15 mmHg. Comparison(s): A prior study was performed on 10/05/2019. No significant change from prior study. Subtle decrease in RV function. FINDINGS  Left Ventricle: Diastology suggestive of impaired LV filling. Left ventricular ejection fraction, by estimation, is 60 to 65%. The left ventricle has normal function. The left ventricle has no regional wall motion abnormalities. The left ventricular internal cavity size was normal in size. There is moderate concentric left ventricular hypertrophy. Left ventricular diastolic parameters are indeterminate. Right Ventricle: The right ventricular size is  normal. Right vetricular  wall thickness was not well visualized. Right ventricular systolic function is low normal. There is normal pulmonary artery systolic pressure. The tricuspid regurgitant velocity is 2.30 m/s, and with an assumed right atrial pressure of 3 mmHg, the estimated right ventricular systolic pressure is 29.5 mmHg. Left Atrium: Left atrial size was moderately dilated. Right Atrium: Right atrial size was moderately dilated. Pericardium: There is no evidence of pericardial effusion. Mitral Valve: The mitral valve is myxomatous. There is mild calcification of the mitral valve leaflet(s). Moderate mitral annular calcification. No evidence of mitral valve regurgitation. MV peak gradient, 25.4 mmHg. The mean mitral valve gradient is 5.0  mmHg with average heart rate of 87 bpm. Tricuspid Valve: The tricuspid valve is grossly normal. Tricuspid valve regurgitation is trivial. Aortic Valve: The aortic valve is tricuspid. Aortic valve regurgitation is not visualized. Pulmonic Valve: The pulmonic valve was not well visualized. Pulmonic valve regurgitation is trivial. Aorta: The aortic root and ascending aorta are structurally normal, with no evidence of dilitation. Venous: The inferior vena cava is dilated in size with less than 50% respiratory variability, suggesting right atrial pressure of 15 mmHg. IAS/Shunts: The interatrial septum appears to be lipomatous. The atrial septum is grossly normal.  LEFT VENTRICLE PLAX 2D LVIDd:         4.60 cm     Diastology LVIDs:         3.00 cm     LV e' medial:    5.87 cm/s LV PW:         1.40 cm     LV E/e' medial:  27.1 LV IVS:        1.40 cm     LV e' lateral:   8.21 cm/s LVOT diam:     1.70 cm     LV E/e' lateral: 19.4 LV SV:         62 LV SV Index:   33 LVOT Area:     2.27 cm  LV Volumes (MOD) LV vol d, MOD A2C: 99.9 ml LV vol d, MOD A4C: 71.4 ml LV vol s, MOD A2C: 35.9 ml LV vol s, MOD A4C: 26.8 ml LV SV MOD A2C:     64.0 ml LV SV MOD A4C:     71.4 ml LV SV MOD BP:      56.6 ml RIGHT  VENTRICLE            IVC RV S prime:     8.08 cm/s  IVC diam: 3.00 cm TAPSE (M-mode): 1.4 cm LEFT ATRIUM             Index       RIGHT ATRIUM           Index LA diam:        3.80 cm 2.00 cm/m  RA Area:     17.30 cm LA Vol (A2C):   56.3 ml 29.70 ml/m RA Volume:   44.90 ml  23.69 ml/m LA Vol (A4C):   44.9 ml 23.69 ml/m LA Biplane Vol: 55.6 ml 29.33 ml/m  AORTIC VALVE LVOT Vmax:   129.00 cm/s LVOT Vmean:  86.900 cm/s LVOT VTI:    0.272 m  AORTA Ao Root diam: 3.40 cm Ao Asc diam:  3.40 cm MITRAL VALVE                TRICUSPID VALVE MV Area (PHT): 4.15 cm     TR Peak grad:   21.2 mmHg MV Peak grad:  25.4  mmHg    TR Vmax:        230.00 cm/s MV Mean grad:  5.0 mmHg MV Vmax:       2.52 m/s     SHUNTS MV Vmean:      88.8 cm/s    Systemic VTI:  0.27 m MV Decel Time: 183 msec     Systemic Diam: 1.70 cm MV E velocity: 159.00 cm/s MV A velocity: 209.00 cm/s MV E/A ratio:  0.76 Rudean Haskell MD Electronically signed by Rudean Haskell MD Signature Date/Time: 10/09/2020/4:38:55 PM    Final       EKG: Independently reviewed.    A & P   Principal Problem:   Acute on chronic respiratory failure with hypoxia (HCC) Active Problems:   Essential hypertension   AKI (acute kidney injury) ([place])   Acute on chronic diastolic heart failure (HCC)   History of pulmonary embolism   Hypotension   CAP (community acquired pneumonia)   1. Acute on chronic hypoxic respiratory failure, multifactorial: Suspected heart failure exacerbation, CAP and very high suspicion for pulmonary embolism a. Afebrile, hypotensive despite fluids, SPO2 down to 88% on room air in ED b. Concern for massive vs. submassive PE: Hypoxic, low-grade fever at home, tachycardic, hypotensive despite fluids, RV strain on echo, D-dimer 8.7, minimally elevated troponin and history of DVT and PE with IVC filter due to GI bleed and abdominal wall hematoma on anticoagulation.   i. Started empiric heparin drip ii. Discussed with Dr. Earleen Newport,  interventional radiology who recommended getting a CTA chest despite AKI if okay with the patient and will see in consult.  This was discussed with the patient who agreed to go forward with a CTA chest 1. Addendum: BP has improved and GFR is too low for CT chest per radiology. Will hold off on CT for now and hopefully she can get in the AM c. Heart failure concerns: Recently decreased Lasix, + orthopnea, volume overload, rales, BNP 491.  i. Discontinue IV fluids ii. Heart failure protocol iii. Cardiology consulted, discussed with Dr. Johney Frame: Stat echo and will see in consult d. CAP concerns: fever at home, hypoxic, procalcitonin 0.6, abnormal CXR, WBC 18 i. continue empiric antibiotics for now with ceftriaxone and doxycycline (QTc 499 ms) ii. Viral respiratory panel checked  e. Place in stepdown unit but may need to transfer to [facility] for IR intervention pending above work-up  2. Hypotension a. Hold antihypertensives b. Hold IV fluids for now due to concern for heart failure c. May need pressors - PCCM Consult, discussed with Dr. Silas Flood  3. AKI on CKD 3a, Multifactorial: hemodynamic changes, lisinopril, possibly cardio renal a. Hold lisinopril b. May need renal consult if renal function worsens c. Hopefully can get a CT in the AM if improved  4. Acute on chronic diastolic heart failure exacerbation a. Suspect secondary to PE b. Cardiology consulted  5. COPD, possible mild exacerbation a. Duonebs and Albuterol  6. Hypothyroidism a. Continue synthroid   DVT prophylaxis: heparin drip   Code Status: DNR  Diet: low sodium Family Communication: Admission, patients condition and plan of care including tests being ordered have been discussed with the patient who indicates understanding and agrees with the plan and Code Status. Patient's son was updated  Disposition Plan: The appropriate patient status for this patient is INPATIENT. Inpatient status is judged to be reasonable and  necessary in order to provide the required intensity of service to ensure the patient's safety. The patient's presenting symptoms, physical exam findings,  and initial radiographic and laboratory data in the context of their chronic comorbidities is felt to place them at high risk for further clinical deterioration. Furthermore, it is not anticipated that the patient will be medically stable for discharge from the hospital within 2 midnights of admission. The following factors support the patient status of inpatient.   " The patient's presenting symptoms include shortness of breath, hypoxia, fever. " The worrisome physical exam findings include hypotension, hypoxia, orthopnea. " The initial radiographic and laboratory data are worrisome because of possible pneumonia, elevated BNP, elevated D dimer. " The chronic co-morbidities include COPD, CAD s/p CABG, HFpEF, DVT/PE with IVC, CKD 3.   * I certify that at the point of admission it is my clinical judgment that the patient will require inpatient hospital care spanning beyond 2 midnights from the point of admission due to high intensity of service, high risk for further deterioration and high frequency of surveillance required.*   The medical decision making on this patient was of high complexity and the patient is at high risk for clinical deterioration, therefore this is a level 3 admission.  Consultants  . Cardiology . PCCM . IR  Procedures  . none  Time Spent on Admission: 120 minutes    Harold Hedge, DO [facility]  10/09/2020, 5:37 PM

## 2020-10-09 NOTE — ED Provider Notes (Signed)
[place] EMERGENCY DEPARTMENT Provider Note  CSN: [id number] Arrival date & time: 10/09/20 1103    History Chief Complaint  Patient presents with  . Shortness of Breath    HPI  [name] is a 79 y.o. female brought to the ED via EMS from [place]. She was seen there recently for a cough, had a neg Covid Ag test and CXR showed infiltrates and so she was started on Abx. She returned today with worsening cough, SOB and fever at home. She was having trouble with her O2 tank at home and UC was also out of oxygen. She had reported SpO2 of 72% there. She was given nebs and increased her O2 to her usual 4L with EMS and SpO2 improved. She has had fevers as high as 100.49F at home. Covid Ag test was again neg today. She also has a history of CABG and CHF and has noticed some LE edema recently.    Past Medical History:  Diagnosis Date  . Anemia    bld. transfusion post lumbar surgery- 2012  . Anxiety   . Arthralgia    NOS  . Blood transfusion 2012; 02/2018   WITH BACK SURGERY; "related to hematoma in stomach" (10/06/2018)  . CAD (coronary artery disease)    s/p CABG 2004; s/p DES to LM in 2010;  [place] 10/29/11: EF 50-55%, mild elevated filling pressures, no pulmonary HTN, LM 90% ISR, LAD and CFX occluded, S-RI occluded (old), S-OM3 ok and L-LAD ok, native nondominant RCA 95% -  med rx recommended ; Lexiscan Myoview 7/13 at [facility]: demonstrated "normal LV function, anterior attenuation and localized ischemia, inferior, basilar, mid section"  . Carotid artery disease ([place])    Carotid US 2/63:  RICA 7-85; LICA 88-50; R subclavian stenosis - Repeat 1 year. // Carotid US 05/2019: R 1-39; L 40-59; R vertebral with atypical antegrade flow; R subclavian stenosis >> repeat 1 year // Carotid US 7/21: Bilat 40-59; L subclavian stenosis   . CHF (congestive heart failure) ([place]) 01/21/2020  . Chronic diastolic heart failure (HCC)    Echo 9/10: EF 27-74%, grade 1 diastolic dysfunction  .  Chronic lower back pain   . COPD (chronic obstructive pulmonary disease) ([place])    Emphysema dxed by Dr. Woody Seller in [place] based on PFTs per pt in 2006; placed on albuterol  . Depression    TAKES CELEXA  AND  (OFF- WELBUTRIN)  . Diuretic-induced hypokalemia 10/08/2018  . DVT of lower extremity (deep venous thrombosis) (HCC)    recurrent. bilateral (2 episodes)  . Dyspnea    home o2 when needed  . Dysrhythmia    afib with cabg  . Exertional angina (HCC)    Treated with Isosorbide, Ranexa, amlodipine; intolerant to metoprolol  . GERD (gastroesophageal reflux disease)   . Gout    "on daily RX" (10/06/2018)  . HLD (hyperlipidemia)   . Hypertension   . Hyperthyroidism   . L Subclavian Artery Stenosis    Carotid US 7/21   . Obesity (BMI 30-39.9) 2009   BMI 33  . Osteoarthritis    "all over; hands" (10/06/2018)  . Oxygen deficiency   . Oxygen dependent    4L at home as needed (10/06/2018)  . Pneumonia   . PVD (peripheral vascular disease) ([place])   . Sleep apnea 2012   USED CPAP THEN  STARTED USING SPIRIVA , Nov. 2013- last evaluation , changed from mask to aparatus that is just for her nose.. ; reports  05-28-18 "the mask smothers me so i dont use it right now" (10/06/2018)    Past Surgical History:  Procedure Laterality Date  . ANKLE SURGERY  2004  . ANTERIOR CERVICAL DECOMP/DISCECTOMY FUSION  11/2007  . AUGMENTATION MAMMAPLASTY    . [place]   lower; another scheduled, opt. reports 4 back- lumbar, 3 cerv. fusions  for later 2009  . CARDIAC CATHETERIZATION  11/2009   Patent LIMA to LAD and patent SVG to OM1. Occluded SVG to ramus and diagonal. Left main: 90% ostial stenosis, LCX 60-70% proximal stenosis  . CATARACT EXTRACTION W/ INTRAOCULAR LENS  IMPLANT, BILATERAL Bilateral 2006  . COLONOSCOPY WITH PROPOFOL N/A 02/17/2017   Procedure: COLONOSCOPY WITH PROPOFOL;  Surgeon: Jerene Bears, MD;  [facility] END[facility];  Service: Endoscopy;  Laterality: N/A;  . CORONARY  ANGIOPLASTY WITH STENT PLACEMENT  11/2009   Drug eluting stent to left main artery: 4.0 X 12 mm Ion   . CORONARY ARTERY BYPASS GRAFT  2004   "CABG X4"  . ESOPHAGOGASTRODUODENOSCOPY N/A 02/14/2017   Procedure: ESOPHAGOGASTRODUODENOSCOPY (EGD);  Surgeon: Doran Stabler, MD;  Location: [facility] ENDOSCOPY;  Service: Endoscopy;  Laterality: N/A;  . GIVENS CAPSULE STUDY N/A 02/15/2017   Procedure: GIVENS CAPSULE STUDY;  Surgeon: Doran Stabler, MD;  Location: [place];  Service: Endoscopy;  Laterality: N/A;  . GROIN MASS OPEN BIOPSY  2004  . IR IVC FILTER PLMT / S&I /IMG GUID/MOD SED  03/14/2018  . IR PARACENTESIS  03/10/2018  . JOINT REPLACEMENT    . LAPAROSCOPIC CHOLECYSTECTOMY    . PERIPHERAL VASCULAR INTERVENTION Left 03/05/2018   Procedure: PERIPHERAL VASCULAR INTERVENTION;  Surgeon: Elam Dutch, MD;  Location: [place] CV LAB;  Service: Cardiovascular;  Laterality: Left;  Attempted unsuccess\ful Per Dr. Eden Lathe  . PERIPHERAL VASCULAR INTERVENTION  03/08/2018   Procedure: PERIPHERAL VASCULAR INTERVENTION;  Surgeon: Waynetta Sandy, MD;  Location: [place] CV LAB;  Service: Cardiovascular;;  SMA Stent   . REPLACEMENT TOTAL KNEE BILATERAL Bilateral 2012  . REVERSE SHOULDER ARTHROPLASTY  02/26/2012   Procedure: REVERSE SHOULDER ARTHROPLASTY;  Surgeon: Marin Shutter, MD;  Location: [place];  Service: Orthopedics;  Laterality: Right;  RIGHT SHOULDER REVERSED ARTHROPLASTY  . SHOULDER ARTHROSCOPY WITH SUBACROMIAL DECOMPRESSION Left 02/10/2013   Procedure: SHOULDER ARTHROSCOPY WITH SUBACROMIAL DECOMPRESSION DISTAL CLAVICLE RESECTION;  Surgeon: Marin Shutter, MD;  Location: [place];  Service: Orthopedics;  Laterality: Left;  DISTAL CLAVICLE RESECTION  . TONSILLECTOMY    . TOTAL HIP ARTHROPLASTY  2007   Right  . TOTAL HIP ARTHROPLASTY Left 02/07/2020   Procedure: TOTAL HIP ARTHROPLASTY ANTERIOR APPROACH;  Surgeon: Paralee Cancel, MD;  Location: [facility];  Service: Orthopedics;  Laterality:  Left;  70 mins  . TRANSURETHRAL RESECTION OF BLADDER TUMOR N/A 05/31/2018   Procedure: TRANSURETHRAL RESECTION OF BLADDER TUMOR (TURBT);  Surgeon: Lucas Mallow, MD;  Location: [facility];  Service: Urology;  Laterality: N/A;  . VISCERAL ANGIOGRAPHY N/A 03/05/2018   Procedure: VISCERAL ANGIOGRAPHY;  Surgeon: Elam Dutch, MD;  Location: [place] CV LAB;  Service: Cardiovascular;  Laterality: N/A;  . VISCERAL ANGIOGRAPHY N/A 03/08/2018   Procedure: VISCERAL ANGIOGRAPHY;  Surgeon: Waynetta Sandy, MD;  Location: [place] CV LAB;  Service: Cardiovascular;  Laterality: N/A;    Family History  Problem Relation Age of Onset  . Ovarian cancer Mother   . Lung cancer Father   . COPD Father   . Heart disease Father   .  Heart disease Sister   . Stomach cancer Sister   . Colitis Sister     Social History   Tobacco Use  . Smoking status: Former Smoker    Packs/day: 1.00    Years: 45.00    Pack years: 45.00    Types: Cigarettes    Quit date: 12/22/2000    Years since quitting: 19.8  . Smokeless tobacco: Never Used  Vaping Use  . Vaping Use: Never used  Substance Use Topics  . Alcohol use: Never    Alcohol/week: 0.0 standard drinks  . Drug use: Never     Home Medications Prior to Admission medications   Medication Sig Start Date End Date Taking? Authorizing Provider  acetaminophen (TYLENOL) 500 MG tablet Take 2 tablets (1,000 mg total) by mouth every 8 (eight) hours. 02/08/20   Danae Orleans, PA-C  allopurinol (ZYLOPRIM) 100 MG tablet Take 100 mg by mouth daily. 01/30/17   [provider]  ARIPiprazole (ABILIFY) 2 MG tablet Take 2 mg by mouth daily. 12/29/19   [provider]  ascorbic acid (VITAMIN C) 500 MG tablet Take 500 mg by mouth daily.    [provider]  atorvastatin (LIPITOR) 10 MG tablet Take 10 mg by mouth daily.  10/24/19   [provider]  citalopram (CELEXA) 40 MG tablet Take 40 mg by mouth at bedtime. 08/30/19   [provider]  cyclobenzaprine (FLEXERIL) 10 MG tablet Take 10 mg by mouth 3 (three) times daily as needed for muscle spasms.     [provider]  docusate sodium (COLACE) 100 MG capsule Take 1 capsule (100 mg total) by mouth 2 (two) times daily. 02/08/20   Danae Orleans, PA-C  ferrous sulfate (FERROUSUL) 325 (65 FE) MG tablet Take 1 tablet (325 mg total) by mouth 3 (three) times daily with meals for 14 days. 02/08/20 02/22/20  Danae Orleans, PA-C  furosemide (LASIX) 40 MG tablet Take two tablets by mouth every Monday and Thursday afternoon.  Take one tablet all other afternoons. 03/13/20   Fay Records, MD  gabapentin (NEURONTIN) 100 MG capsule Take 100 mg by mouth daily. 01/09/20   [provider]  guaiFENesin (MUCINEX) 600 MG 12 hr tablet Take 600 mg by mouth 2 (two) times daily.     [provider]  INCRUSE ELLIPTA 62.5 MCG/INH AEPB Inhale 1 puff into the lungs 2 (two) times daily.  11/30/16   [provider]  Ipratropium-Albuterol (COMBIVENT) 20-100 MCG/ACT AERS respimat Inhale 1 puff into the lungs every 6 (six) hours as needed for wheezing or shortness of breath (or to help expel mucous).     [provider]  levothyroxine (SYNTHROID) 50 MCG tablet Take 1 tablet (50 mcg total) by mouth daily at 6 (six) AM. 09/13/20   Fay Records, MD  magnesium oxide (MAG-OX) 400 (241.3 Mg) MG tablet Take 1 tablet (400 mg total) by mouth 2 (two) times daily. 03/26/18   Mikhail, Velta Addison, DO  Melatonin 10 MG TABS Take 10 mg by mouth at bedtime.     [provider]  metolazone (ZAROXOLYN) 2.5 MG tablet  08/22/20   [provider]  neomycin-polymyxin-hydrocortisone (CORTISPORIN) 3.5-10000-1 OTIC suspension Place 5 drops into the right ear daily as needed (ear pain).  08/31/19   [provider]  nitroGLYCERIN (NITROSTAT) 0.4 MG SL tablet Place 1 tablet (0.4 mg total) under the tongue every 5 (five) minutes as needed for chest pain. For chest pain  04/24/16   Dorris Carnes  V, MD  nystatin (MYCOSTATIN) 100000 UNIT/ML suspension  08/14/20   [provider]  ofloxacin (FLOXIN) 0.3 % OTIC solution  04/13/20   [provider]  omeprazole (PRILOSEC) 40 MG capsule Take 1 capsule (40 mg total) by mouth 2 (two) times daily. 12/19/19   Esterwood, Amy S, PA-C  oxyCODONE 10 MG TABS Take 1-2 tablets (10-20 mg total) by mouth every 4 (four) hours as needed for moderate pain or severe pain. 02/08/20   Danae Orleans, PA-C  OXYGEN Inhale 4 L/min into the lungs as needed (DURING ALL TIMES OF EXERTION).     [provider]  pilocarpine (PILOCAR) 1 % ophthalmic solution Place 1 drop into both eyes 2 (two) times daily.    [provider]  polyethylene glycol (MIRALAX / GLYCOLAX) 17 g packet Take 17 g by mouth 2 (two) times daily. 02/08/20   Danae Orleans, PA-C  potassium chloride (KLOR-CON) 10 MEQ tablet Take 2 tablets (20 mEq total) by mouth daily. Take 2 additional tablets every Monday and Thursday. 03/13/20   Fay Records, MD  pramipexole (MIRAPEX) 0.25 MG tablet  08/22/20   [provider]  prednisoLONE acetate (PRED FORTE) 1 % ophthalmic suspension  04/13/20   [provider]  predniSONE (DELTASONE) 20 MG tablet  08/03/20   [provider]  rivaroxaban (XARELTO) 10 MG TABS tablet Take 1 tablet (10 mg total) by mouth daily for 14 days. 02/09/20 02/23/20  Danae Orleans, PA-C  roflumilast (DALIRESP) 500 MCG TABS tablet Take 500 mcg by mouth every evening.     [provider]  SPIRIVA RESPIMAT 1.25 MCG/ACT AERS  08/24/20   [provider]  spironolactone (ALDACTONE) 25 MG tablet TAKE 1 TABLET BY MOUTH DAILY 11/25/19   Fay Records, MD  traZODone (DESYREL) 50 MG tablet Take 150 mg by mouth at bedtime.  10/07/18   [provider]     Allergies    Ativan [lorazepam], Pitavastatin, Ropinirole, Zofran [ondansetron hcl], Zolpidem tartrate, and Penicillins   Review of Systems   Review  of Systems A comprehensive review of systems was completed and negative except as noted in HPI.    Physical Exam BP (!) 87/53   Pulse 89   Temp 98.7 F (37.1 C) (Oral)   Resp (!) 26   Ht 5\' 8"  (1.727 m)   Wt 76 kg   SpO2 94%   BMI 25.48 kg/m   Physical Exam Vitals and nursing note reviewed.  Constitutional:      Appearance: Normal appearance.  HENT:     Head: Normocephalic and atraumatic.     Nose: Nose normal.     Mouth/Throat:     Mouth: Mucous membranes are moist.  Eyes:     Extraocular Movements: Extraocular movements intact.     Conjunctiva/sclera: Conjunctivae normal.  Cardiovascular:     Rate and Rhythm: Normal rate.  Pulmonary:     Effort: Pulmonary effort is normal.     Breath sounds: Decreased breath sounds and rhonchi present.  Abdominal:     General: Abdomen is flat.     Palpations: Abdomen is soft.     Tenderness: There is no abdominal tenderness.  Musculoskeletal:        General: No swelling. Normal range of motion.     Cervical back: Neck supple.     Comments: Trace BLE edema, symmetric  Skin:    General: Skin is warm and dry.  Neurological:     General: No focal deficit present.  Mental Status: She is alert.  Psychiatric:        Mood and Affect: Mood normal.      ED Results / Procedures / Treatments   Labs (all labs ordered are listed, but only abnormal results are displayed) Labs Reviewed  BASIC METABOLIC PANEL - Abnormal; Notable for the following components:      Result Value   Chloride 97 (*)    Glucose, Bld 117 (*)    BUN 46 (*)    Creatinine, Ser 1.97 (*)    GFR, Estimated 23 (*)    All other components within normal limits  CBC - Abnormal; Notable for the following components:   WBC 18.0 (*)    RBC 3.57 (*)    Hemoglobin 10.6 (*)    HCT 33.5 (*)    All other components within normal limits  BRAIN NATRIURETIC PEPTIDE - Abnormal; Notable for the following components:   B Natriuretic Peptide 491.4 (*)    All other  components within normal limits  TROPONIN I (HIGH SENSITIVITY) - Abnormal; Notable for the following components:   Troponin I (High Sensitivity) 36 (*)    All other components within normal limits  RESPIRATORY PANEL BY RT PCR (FLU A&B, COVID)  CULTURE, BLOOD (ROUTINE X 2)  CULTURE, BLOOD (ROUTINE X 2)  LACTIC ACID, PLASMA  TROPONIN I (HIGH SENSITIVITY)    EKG EKG Interpretation  Date/Time:  Tuesday October 09 2020 11:20:46 EDT Ventricular Rate:  101 PR Interval:    QRS Duration: 116 QT Interval:  380 QTC Calculation: 493 R Axis:   -66 Text Interpretation: Sinus tachycardia Supraventricular bigeminy Artifact Incomplete left bundle branch block Probable left ventricular hypertrophy Inferior infarct, acute (RCA) Probable RV involvement, suggest recording right precordial leads No significant change since last tracing Confirmed by Calvert Cantor [phone]) on 10/09/2020 12:25:15 PM   Radiology DG Chest Port 1 View  Result Date: 10/09/2020 CLINICAL DATA:  Shortness of breath, cough and fever. EXAM: PORTABLE CHEST 1 VIEW COMPARISON:  10/05/2019 FINDINGS: Previous median sternotomy and CABG. Mild cardiomegaly. Aortic atherosclerosis. Bilateral breast implants with density overlying the chest. Suspicion of bronchitis and hazy pneumonia in the mid and lower lungs. No dense consolidation, lobar collapse or effusion. Old shoulder replacement on the right. Chronic degenerative changes of the left shoulder. IMPRESSION: 1. Suspicion of bronchitis and hazy pneumonia in the mid and lower lungs. No dense consolidation or lobar collapse. 2. Previous CABG. Mild cardiomegaly. Aortic atherosclerosis. Electronically Signed   By: Nelson Chimes M.D.   On: 10/09/2020 13:15    Procedures .Critical Care Performed by: Truddie Hidden, MD Authorized by: Truddie Hidden, MD   Critical care provider statement:    Critical care time (minutes):  45   Critical care was necessary to treat or prevent imminent  or life-threatening deterioration of the following conditions:  Sepsis and respiratory failure   Critical care was time spent personally by me on the following activities:  Discussions with consultants, evaluation of patient's response to treatment, examination of patient, ordering and performing treatments and interventions, ordering and review of laboratory studies, ordering and review of radiographic studies, pulse oximetry, re-evaluation of patient's condition, obtaining history from patient or surrogate and review of old charts    Medications Ordered in the ED Medications  azithromycin (ZITHROMAX) 500 mg in sodium chloride 0.9 % 250 mL IVPB (has no administration in time range)  lactated ringers bolus 1,500 mL (has no administration in time range)  lactated ringers bolus 1,000 mL (  0 mLs Intravenous Stopped 10/09/20 1400)  cefTRIAXone (ROCEPHIN) 1 g in sodium chloride 0.9 % 100 mL IVPB (1 g Intravenous New Bag/Given 10/09/20 1403)     MDM Rules/Calculators/A&P MDM Patient with cough, SOB and fever, recently treated for CAP but does not know what Abx. She has not been vaccinated for Covid. She is afebrile and not tachycardic in the ED but BP has trended down. Has mild LE edema, but more likely to be sepsis than acute CHF so will give an LR fluid bolus pending additional labs.  ED Course  I have reviewed the triage vital signs and the nursing notes.  Pertinent labs & imaging results that were available during my care of the patient were reviewed by me and considered in my medical decision making (see chart for details).  Clinical Course as of Oct 09 1442  Tue Oct 09, 2020  1241 WBC is elevated at 18, she was give Decadron at [facility] today.    [CS]  4496 BMP with creatinine above baseline.    [CS]  1344 Covid is neg, Lactic acid is neg. Patient's BNP is mildly elevated above baseline. Will not give large volume LR resuscitation in setting of evidence of acute CHF exacerbation.    [CS]  7591  Patient's BP remains borderline low after LR 1000cc bolus. Will give additional 1500cc bolus to complete 30cc/kg bolus. She does not appear to be in worsening respiratory distress at this time.    [CS]  1441 Spoke with Dr. Neysa Bonito, Hospitalist, who will evaluate patient for admission.    [CS]    Clinical Course User Index [CS] Truddie Hidden, MD    Final Clinical Impression(s) / ED Diagnoses Final diagnoses:  Community acquired pneumonia of right lower lobe of lung  Sepsis without acute organ dysfunction, due to unspecified organism [facility])    Rx / DC Orders ED Discharge Orders    None       Truddie Hidden, MD 10/09/20 1443

## 2020-10-09 NOTE — ED Notes (Signed)
Patient drops to 88% RA.

## 2020-10-09 NOTE — Consult Note (Signed)
NAME:  [name], MRN:  [id number], DOB:  October 29, 1941, LOS: 0 ADMISSION DATE:  10/09/2020, CONSULTATION DATE:  target organ damage REFERRING MD: Harold Hedge, MD CHIEF COMPLAINT: Cough, shortness of breath  Brief History   This is an 79 year old woman with past history of CAD status post CABG, congestive heart failure with admissions in the past, severe COPD due to former tobacco abuse, chronic hypoxemic respiratory failure requiring 4 L nasal cannula, history of DVT who could not tolerate anticoagulation status post IVC filter still in place baseline who presented to the ED from her doctor's office with a couple days of increased cough, congestion, shortness of breath, and mild fever number seen in consultation at the request of Madelin Headings, MD for evaluation of acute on chronic hypoxemic respiratory failure.  History of present illness   Patient is felt a bit ill the last couple of days.  Notes increased cough, chest congestion.  Notes borderline fevers at home as high as the 100.8 Fahrenheit.  Coughing up more sputum than normal.  Increased shortness of breath including worsening wheeze.  Shortness of breath with exertion.  Notably, more short of breath when lying flat as well.  Last couple days has had poor appetite but has been drinking a lot of water per her and son at bedside.  She went to urgent care recently with negative Covid testing was given some antibiotics. she still did not feel well so went to her doctor's office this morning.  Her portable tank ran out of oxygen.  The doctor's office had no oxygen.  EMS was called as her sats dropped.  Reportedly 60s on room air.  Came to the ED after nebulizer treatment up to the 90s.  Was placed on 4 L nasal cannula, 97% at time of my evaluation.  Lab work notable for increased creatinine 1.9 from baseline 1.3-1.5, elevated proBNP 491.4 which is the highest value in our system and double values when she was admitted for congestive heart failure  exacerbation in 2020.  Her troponin was mildly elevated.  Chest x-ray notable for hazy bilateral interstitial prominence most notable centrally but also peripherally when compared to most recent film in 2020 my interpretation.  Her lactic acid is within normal limits.  Blood pressures have been borderline.  A D-dimer was obtained which was grossly positive.  Unable to obtain CTA PE protocol due to AKI.  Echocardiogram was obtained which demonstrated impaired LV filling, normal EF, low normal RV function, mildly dilated LA, moderately dilated RA, elevated right atrial pressure 15 mmHg.  Past Medical History  CAD, CKD, COPD, chronic hypoxic respiratory failure, DVT/PE  Significant Hospital Events   Admitted 10/19  Consults:  Cardiology, VIR, pulmonary  Procedures:  N/A  Significant Diagnostic Tests:  Elevated D-dimer, proBNP elevated   Micro Data:  Blood cultures pending  Antimicrobials:  Ceftriaxone, doxycycline, azithromycin  Interim history/subjective:  As above  Objective   Blood pressure (!) 94/49, pulse 80, temperature 98.7 F (37.1 C), temperature source Oral, resp. rate 20, height 5\' 8"  (1.727 m), weight 76 kg, SpO2 97 %.        Intake/Output Summary (Last 24 hours) at 10/09/2020 1854 Last data filed at 10/09/2020 1657 Gross per 24 hour  Intake 1649 ml  Output --  Net 1649 ml   Filed Weights   10/09/20 1123  Weight: 76 kg    Examination: General: Chronically ill-appearing, in no acute distress Eyes: EOMI, no icterus Neck: JVP 4 cm above clavicle  with head of bed at 60 degrees, estimated central venous pressure mid to low teens, supple Respiratory: Inspiratory wheeze much more prominent on the left, inspiratory rhonchi on the right, normal work of breathing on 4 L nasal cannula Cardiovascular: Regular rate and rhythm, no murmurs Abdomen: Soft, nondistended, bowel sounds present Extremities: Warm, 1+ pitting edema to midshin bilaterally, Neuro: No weakness,  sensation intact Psych: Normal mood, full affect  Resolved Hospital Problem list   N/A  Assessment & Plan:  Chronic hypoxemic respiratory failure: Currently stable on chronic her home 4 L nasal cannula with sats in high 90s.  Likely combination of severe COPD as well as element of chronic volume overload. --See below for further recommendations  Sepsis due to community-acquired pneumonia: Fever, leukocytosis, chest x-ray infiltrates. --Continue Coverage with doxycycline and ceftriaxone  COPD with acute exacerbation: Increase sputum, congestion, cough with wheeze on exam.  Chest x-ray with hazy infiltrates suspicious for infectious etiology.  Covid negative.  Clinically, suspect this is the largest contributor to her presentation. --Continue antibiotics --Prednisone 40 mg daily ordered to start tonight --Bronchodilator therapies via nebulizer ordered  Volume overload: She has been edema on shins as well as elevated JVP on exam and estimated elevated RA pressure.  She endorses orthopnea.  Do feel there is an element of volume overload although this may be more chronic in nature.  Given borderline blood pressures at this time, do not feel she warrants diuresis especially in the setting of home oxygen saturation.  Do think eventually this hospitalization she very well may need increased Lasix support. --Continue to monitor, consider Lasix in the future once blood pressure stabilizes  Elevated D-dimer: Concern for PE given history of same, DVT.  Do feel with her chest x-ray infiltrates and cough, congestion and mild volume overload, we have other etiologies for her presentation.  However given elevated D-dimer do feel obligated to evaluate for PE.  Notably, presuming it is in the correct position, her IVC filter should protect her from lower extremity DVTs causing PE.  Upper extremity DVTs: Causing PE are less common but possible. --Agree with VQider venous upper and lower extremity Dopplers to  evaluate for presence of DVT which would warrant anticoagulation --Continue therapeutic heparin for now  Labs   CBC: Recent Labs  Lab 10/09/20 1141  WBC 18.0*  HGB 10.6*  HCT 33.5*  MCV 93.8  PLT 034    Basic Metabolic Panel: Recent Labs  Lab 10/09/20 1141  NA 138  K 4.1  CL 97*  CO2 28  GLUCOSE 117*  BUN 46*  CREATININE 1.97*  CALCIUM 9.2   GFR: Estimated Creatinine Clearance: 23 mL/min (A) (by C-G formula based on SCr of 1.97 mg/dL (H)). Recent Labs  Lab 10/09/20 1141 10/09/20 1238 10/09/20 1355  PROCALCITON  --   --  0.61  WBC 18.0*  --   --   LATICACIDVEN  --  1.2  --     Liver Function Tests: No results for input(s): AST, ALT, ALKPHOS, BILITOT, PROT, ALBUMIN in the last 168 hours. No results for input(s): LIPASE, AMYLASE in the last 168 hours. No results for input(s): AMMONIA in the last 168 hours.  ABG    Component Value Date/Time   PHART 7.374 03/14/2018 0127   PCO2ART 29.2 (L) 03/14/2018 0127   PO2ART 139 (H) 03/14/2018 0127   HCO3 16.7 (L) 03/14/2018 0127   TCO2 30 10/29/2011 0925   ACIDBASEDEF 7.6 (H) 03/14/2018 0127   O2SAT 98.8 03/14/2018 0127  Coagulation Profile: No results for input(s): INR, PROTIME in the last 168 hours.  Cardiac Enzymes: No results for input(s): CKTOTAL, CKMB, CKMBINDEX, TROPONINI in the last 168 hours.  HbA1C: No results found for: HGBA1C  CBG: No results for input(s): GLUCAP in the last 168 hours.  Review of Systems:   Positive for orthopnea, positive for worsening lower extremity swelling of the feet, no chest pain.  Comprehensive review systems otherwise negative.  Past Medical History  She,  has a past medical history of Anemia, Anxiety, Arthralgia, Blood transfusion (2012; 02/2018), CAD (coronary artery disease), Carotid artery disease ([place]), CHF (congestive heart failure) ([place]) (01/21/2020), Chronic diastolic heart failure ([place]), Chronic lower back pain, COPD (chronic obstructive pulmonary disease)  ([place]), Depression, Diuretic-induced hypokalemia (10/08/2018), DVT of lower extremity (deep venous thrombosis) ([place]), Dyspnea, Dysrhythmia, Exertional angina ([place]), GERD (gastroesophageal reflux disease), Gout, HLD (hyperlipidemia), Hypertension, Hyperthyroidism, L Subclavian Artery Stenosis, Obesity (BMI 30-39.9) (2009), Osteoarthritis, Oxygen deficiency, Oxygen dependent, Pneumonia, PVD (peripheral vascular disease) ([place]), and Sleep apnea (2012).   Surgical History    Past Surgical History:  Procedure Laterality Date  . ANKLE SURGERY  2004  . ANTERIOR CERVICAL DECOMP/DISCECTOMY FUSION  11/2007  . AUGMENTATION MAMMAPLASTY    . [place]   lower; another scheduled, opt. reports 4 back- lumbar, 3 cerv. fusions  for later 2009  . CARDIAC CATHETERIZATION  11/2009   Patent LIMA to LAD and patent SVG to OM1. Occluded SVG to ramus and diagonal. Left main: 90% ostial stenosis, LCX 60-70% proximal stenosis  . CATARACT EXTRACTION W/ INTRAOCULAR LENS  IMPLANT, BILATERAL Bilateral 2006  . COLONOSCOPY WITH PROPOFOL N/A 02/17/2017   Procedure: COLONOSCOPY WITH PROPOFOL;  Surgeon: Jerene Bears, MD;  Location: [facility] ENDOSCOPY;  Service: Endoscopy;  Laterality: N/A;  . CORONARY ANGIOPLASTY WITH STENT PLACEMENT  11/2009   Drug eluting stent to left main artery: 4.0 X 12 mm Ion   . CORONARY ARTERY BYPASS GRAFT  2004   "CABG X4"  . ESOPHAGOGASTRODUODENOSCOPY N/A 02/14/2017   Procedure: ESOPHAGOGASTRODUODENOSCOPY (EGD);  Surgeon: Doran Stabler, MD;  Location: [facility] ENDOSCOPY;  Service: Endoscopy;  Laterality: N/A;  . GIVENS CAPSULE STUDY N/A 02/15/2017   Procedure: GIVENS CAPSULE STUDY;  Surgeon: Doran Stabler, MD;  Location: [place];  Service: Endoscopy;  Laterality: N/A;  . GROIN MASS OPEN BIOPSY  2004  . IR IVC FILTER PLMT / S&I /IMG GUID/MOD SED  03/14/2018  . IR PARACENTESIS  03/10/2018  . JOINT REPLACEMENT    . LAPAROSCOPIC CHOLECYSTECTOMY    . PERIPHERAL VASCULAR INTERVENTION Left 03/05/2018    Procedure: PERIPHERAL VASCULAR INTERVENTION;  Surgeon: Elam Dutch, MD;  Location: [place] CV LAB;  Service: Cardiovascular;  Laterality: Left;  Attempted unsuccess\ful Per Dr. Eden Lathe  . PERIPHERAL VASCULAR INTERVENTION  03/08/2018   Procedure: PERIPHERAL VASCULAR INTERVENTION;  Surgeon: Waynetta Sandy, MD;  Location: [place] CV LAB;  Service: Cardiovascular;;  SMA Stent   . REPLACEMENT TOTAL KNEE BILATERAL Bilateral 2012  . REVERSE SHOULDER ARTHROPLASTY  02/26/2012   Procedure: REVERSE SHOULDER ARTHROPLASTY;  Surgeon: Marin Shutter, MD;  Location: [place];  Service: Orthopedics;  Laterality: Right;  RIGHT SHOULDER REVERSED ARTHROPLASTY  . SHOULDER ARTHROSCOPY WITH SUBACROMIAL DECOMPRESSION Left 02/10/2013   Procedure: SHOULDER ARTHROSCOPY WITH SUBACROMIAL DECOMPRESSION DISTAL CLAVICLE RESECTION;  Surgeon: Marin Shutter, MD;  Location: [place];  Service: Orthopedics;  Laterality: Left;  DISTAL CLAVICLE RESECTION  . TONSILLECTOMY    . TOTAL HIP ARTHROPLASTY  2007  Right  . TOTAL HIP ARTHROPLASTY Left 02/07/2020   Procedure: TOTAL HIP ARTHROPLASTY ANTERIOR APPROACH;  Surgeon: Paralee Cancel, MD;  Location: [facility];  Service: Orthopedics;  Laterality: Left;  70 mins  . TRANSURETHRAL RESECTION OF BLADDER TUMOR N/A 05/31/2018   Procedure: TRANSURETHRAL RESECTION OF BLADDER TUMOR (TURBT);  Surgeon: Lucas Mallow, MD;  Location: [facility];  Service: Urology;  Laterality: N/A;  . VISCERAL ANGIOGRAPHY N/A 03/05/2018   Procedure: VISCERAL ANGIOGRAPHY;  Surgeon: Elam Dutch, MD;  Location: [place] [facility];  Service: Cardiovascular;  Laterality: N/A;  . VISCERAL ANGIOGRAPHY N/A 03/08/2018   Procedure: VISCERAL ANGIOGRAPHY;  Surgeon: Waynetta Sandy, MD;  Location: [place] [facility];  Service: Cardiovascular;  Laterality: N/A;     Social History   reports that she quit smoking about 19 years ago. Her smoking use included cigarettes. She has a 45.00 pack-year smoking  history. She has never used smokeless tobacco. She reports that she does not drink alcohol and does not use drugs.   Family History   Her family history includes COPD in her father; Colitis in her sister; Heart disease in her father and sister; Lung cancer in her father; Ovarian cancer in her mother; Stomach cancer in her sister.   Allergies Allergies  Allergen Reactions  . Ativan [Lorazepam] Other (See Comments)    Confusion   . Pitavastatin Other (See Comments)    LIVALO: reaction not recalled  . Ropinirole Nausea Only and Other (See Comments)    Makes legs jump, also  . Zofran [Ondansetron Hcl] Nausea Only  . Zolpidem Tartrate Anxiety and Other (See Comments)    CONFUSION   . Penicillins Rash and Other (See Comments)    Tolerated cephalosporins in past Has patient had a PCN reaction causing immediate rash, facial/tongue/throat swelling, SOB or lightheadedness with hypotension: Yes Has patient had a PCN reaction causing severe rash involving mucus membranes or skin necrosis: No Has patient had a PCN reaction that required hospitalization: No Has patient had a PCN reaction occurring within the last 10 years: No If all of the above answers are "NO", then may proceed with Cephalosporin use.      Home Medications  Prior to Admission medications   Medication Sig Start Date End Date Taking? Authorizing Provider  allopurinol (ZYLOPRIM) 100 MG tablet Take 100 mg by mouth daily. 01/30/17  Yes [provider]  ARIPiprazole (ABILIFY) 2 MG tablet Take 2 mg by mouth daily. 12/29/19  Yes [provider]  ascorbic acid (VITAMIN C) 500 MG tablet Take 500 mg by mouth daily.   Yes [provider]  Aspirin-Salicylamide-Caffeine (BC HEADACHE POWDER PO) Take 1 packet by mouth as needed (pain).   Yes [provider]  atorvastatin (LIPITOR) 20 MG tablet Take 20 mg by mouth daily. 09/21/20  Yes [provider]  citalopram (CELEXA) 40 MG tablet Take 40 mg by mouth  at bedtime. 08/30/19  Yes [provider]  cyclobenzaprine (FLEXERIL) 10 MG tablet Take 10 mg by mouth 3 (three) times daily as needed for muscle spasms.    Yes [provider]  ferrous sulfate (FERROUSUL) 325 (65 FE) MG tablet Take 1 tablet (325 mg total) by mouth 3 (three) times daily with meals for 14 days. Patient taking differently: Take 325 mg by mouth daily.  02/08/20 10/09/20 Yes Babish, Rodman Key, PA-C  furosemide (LASIX) 40 MG tablet Take two tablets by mouth every Monday and Thursday afternoon.  Take one tablet all other afternoons. Patient  taking differently: Take 40-80 mg by mouth See admin instructions. Take 80mg  every Monday and Thursday afternoon, and 40mg  on all other afternoons. 03/13/20  Yes Fay Records, MD  gabapentin (NEURONTIN) 100 MG capsule Take 100 mg by mouth daily. 01/09/20  Yes [provider]  guaiFENesin (MUCINEX) 600 MG 12 hr tablet Take 600 mg by mouth 2 (two) times daily.    Yes [provider]  INCRUSE ELLIPTA 62.5 MCG/INH AEPB Inhale 1 puff into the lungs 2 (two) times daily.  11/30/16  Yes [provider]  Ipratropium-Albuterol (COMBIVENT) 20-100 MCG/ACT AERS respimat Inhale 1 puff into the lungs every 6 (six) hours as needed for wheezing or shortness of breath (or to help expel mucous).    Yes [provider]  levothyroxine (SYNTHROID) 50 MCG tablet Take 1 tablet (50 mcg total) by mouth daily at 6 (six) AM. 09/13/20  Yes Fay Records, MD  lisinopril (ZESTRIL) 2.5 MG tablet Take 2.5 mg by mouth daily. 09/25/20  Yes [provider]  magnesium oxide (MAG-OX) 400 (241.3 Mg) MG tablet Take 1 tablet (400 mg total) by mouth 2 (two) times daily. 03/26/18  Yes Mikhail, Velta Addison, DO  Melatonin 10 MG TABS Take 10 mg by mouth at bedtime.    Yes [provider]  metolazone (ZAROXOLYN) 2.5 MG tablet Take 2.5 mg by mouth at bedtime.  08/22/20  Yes [provider]  neomycin-polymyxin-hydrocortisone (CORTISPORIN)  3.5-10000-1 OTIC suspension Place 5 drops into the right ear daily as needed (ear pain).  08/31/19  Yes [provider]  nitroGLYCERIN (NITROSTAT) 0.4 MG SL tablet Place 1 tablet (0.4 mg total) under the tongue every 5 (five) minutes as needed for chest pain. For chest pain 04/24/16  Yes Fay Records, MD  ofloxacin (FLOXIN) 0.3 % OTIC solution Place 2 drops into the left ear daily.  04/13/20  Yes [provider]  omeprazole (PRILOSEC) 40 MG capsule Take 1 capsule (40 mg total) by mouth 2 (two) times daily. Patient taking differently: Take 40 mg by mouth every evening.  12/19/19  Yes Esterwood, Amy S, PA-C  OXYGEN Inhale 4 L/min into the lungs as needed (DURING ALL TIMES OF EXERTION).    Yes [provider]  pilocarpine (PILOCAR) 1 % ophthalmic solution Place 1 drop into both eyes 2 (two) times daily.   Yes [provider]  polyethylene glycol (MIRALAX / GLYCOLAX) 17 g packet Take 17 g by mouth 2 (two) times daily. Patient taking differently: Take 17 g by mouth daily.  02/08/20  Yes Babish, Rodman Key, PA-C  potassium chloride SA (KLOR-CON) 20 MEQ tablet Take 20 mEq by mouth daily. 10/01/20  Yes [provider]  pramipexole (MIRAPEX) 0.25 MG tablet Take 0.25 mg by mouth every evening.  08/22/20  Yes [provider]  roflumilast (DALIRESP) 500 MCG TABS tablet Take 500 mcg by mouth every evening.    Yes [provider]  SPIRIVA RESPIMAT 1.25 MCG/ACT AERS Inhale 2 puffs into the lungs daily.  08/24/20  Yes [provider]  spironolactone (ALDACTONE) 25 MG tablet TAKE 1 TABLET BY MOUTH DAILY Patient taking differently: Take 25 mg by mouth daily.  11/25/19  Yes Fay Records, MD  traZODone (DESYREL) 50 MG tablet Take 150 mg by mouth at bedtime.  10/07/18  Yes [provider]  XTAMPZA ER 9 MG C12A Take 9 mg by mouth every 8 (eight) hours. 09/25/20  Yes [provider]  acetaminophen (TYLENOL) 500 MG tablet Take 2 tablets (  1,000 mg  total) by mouth every 8 (eight) hours. Patient not taking: Reported on 10/09/2020 02/08/20   Danae Orleans, PA-C  docusate sodium (COLACE) 100 MG capsule Take 1 capsule (100 mg total) by mouth 2 (two) times daily. Patient not taking: Reported on 10/09/2020 02/08/20   Danae Orleans, PA-C  oxyCODONE 10 MG TABS Take 1-2 tablets (10-20 mg total) by mouth every 4 (four) hours as needed for moderate pain or severe pain. Patient not taking: Reported on 10/09/2020 02/08/20   Danae Orleans, PA-C  potassium chloride (KLOR-CON) 10 MEQ tablet Take 2 tablets (20 mEq total) by mouth daily. Take 2 additional tablets every Monday and Thursday. Patient not taking: Reported on 10/09/2020 03/13/20   Fay Records, MD  rivaroxaban (XARELTO) 10 MG TABS tablet Take 1 tablet (10 mg total) by mouth daily for 14 days. Patient not taking: Reported on 10/09/2020 02/09/20 10/09/20  Danae Orleans, PA-C     Critical care time: n/a       I spent 65 minutes over 50% of which involved face-to-face patient encounter to provide care for patient including review of prior records, review of labs, studies, imaging, discussion of care, and coordination of care.

## 2020-10-09 NOTE — ED Notes (Signed)
Husband in patients room

## 2020-10-09 NOTE — Progress Notes (Signed)
  Echocardiogram 2D Echocardiogram has been performed.  Bobbye Charleston 10/09/2020, 4:07 PM

## 2020-10-09 NOTE — ED Triage Notes (Signed)
Arrives via EMS from [place], lives at home, C/C SOB x1 week. UC stated O2 was in 70s RA, gave DuoDeb and it came up to 90% RA, EMS reports diminished breath sounds, now on 4L [place] and at 90-93%, sinus tach on monitor, 120s. 12 lead unremarkable. No COVID-19 vaccines, had a rapid COVID test at [facility] and it was negative.

## 2020-10-09 NOTE — ED Notes (Signed)
Patient sitting up and eating dinner / husband in room

## 2020-10-09 NOTE — ED Notes (Signed)
Assumed care of patient at this time, nad noted, sr up x2, bed locked and low, call bell w/I reach.  Will continue to monitor. ° °

## 2020-10-09 NOTE — Consult Note (Addendum)
Cardiology Consultation:   Patient ID: [name] MRN: [id number]; DOB: 1941/04/02  Admit date: 10/09/2020 Date of Consult: 10/09/2020  Primary Care Provider: [name], [facility] Cardiologist: Dorris Carnes, MD  [facility] Electrophysiologist:  None    Patient Profile:   [name] is a 79 y.o. female with a history of CAD s/p CABG in 2004 with subsequent DES to left main in 1093, chronic diastolic CHF, carotid artery disease, PAD s/p SMA stenting in 2019, recurrent DVT/PE s/p IVC filter in 02/2018 not on anticoagulation due to GI bleed and prior large abdominal wall hematoma, COPD on home O2, cirrhosis, hypertension, hyperlipidemia, hyperthyroidism, chronic low back pain, and depression who is being seen today for the evaluation of CHF and chest pain at the request of Dr. Neysa Bonito.  History of Present Illness:   [name] is a 79 year old female with the above history who is followed by Dr. Harrington Challenger. Patient was last seen in 08/2020 at which time [name] noted chronic stable dyspnea with activity and some lower extremity edema but denied any chest pain. Dyspnea was felt to be multifactorial. Pro-BNP was in the 700's but patient was not felt to be significantly volume overloaded so no changes were made.  Patient went to [facility] today for further evaluation of worsening shortness of breath, cough, and fever. O2 sats were in the 70's on room air at the Urgent Care. [name] was given a DuoNeb and O2 sats improved to the 90's. [name] was transported to ED from Urgent Care for further evaluation. Upon arrival to the ED, EKG showed sinus tachycardia with no acute ischemic changes. High-sensitivity troponin mildly elevated and flat at 36 >> 41. BNP elevated at 491. Chest x-ray was suspicious of bronchitis and pneumonia in the mid to lower lobes. WBC 18, Hgb 10.6, Plts 280. Na 139, K 4.1, Glucose 117, BUN 46, Cr 1.97 (baseline around 1.2). Lactic acid normal. Respiratory panel negative  for COVID and Influenza. [name] was admitted and started on IV fluids, IV Heparin, and IV antibiotics. Cardiology consulted for further evaluation.   Patient reports [name] developed a productive cough (coughing up green phlegm) about 1 week that has progressively gotten work. [name] has chronic shortness of breath with [name] COPD and is on 4L of O2 at home. However, [name] noticed worsening shortness of breath even at rest last Friday 10/05/2020. Breathing and cough worsened and then [name] developed some central/left sided chest tightness. [name] does note the chest tightness is worse with exertion but unclear whether this from worsening shortness of breath with exertion. [name] states is feels somewhat similar to symptoms prior to CABG and PCI which is what made [name] a little worried. [name] has stable orthopnea and states [name] sleeps in a recliner but this is not new. [name] does report waking up short of breath recently but unclear if this is true orthopnea. [name] notes some lower extremity edema as well but this is also a chronic issue. [name] denies any palpitations. [name] does note some lightheadedness/dizziness as times. Sounds like this may be during coughing fits when [name] is acutely short of breath. No syncope. [name] notes a fever of 100.8 at home yesterday. No GI symptoms. No abnormal bleeding.      Past Medical History:  Diagnosis Date  . Anemia    bld. transfusion post lumbar surgery- 2012  . Anxiety   . Arthralgia    NOS  . Blood transfusion 2012; 02/2018   WITH  BACK SURGERY; "related to hematoma in stomach" (10/06/2018)  . CAD (coronary artery disease)    s/p CABG 2004; s/p DES to LM in 2010;  [place] 10/29/11: EF 50-55%, mild elevated filling pressures, no pulmonary HTN, LM 90% ISR, LAD and CFX occluded, S-RI occluded (old), S-OM3 ok and L-LAD ok, native nondominant RCA 95% -  med rx recommended ; Lexiscan Myoview 7/13 at [facility]: demonstrated "normal LV function, anterior attenuation and localized ischemia, inferior,  basilar, mid section"  . Carotid artery disease ([place])    Carotid US 9/44:  RICA 9-67; LICA 59-16; R subclavian stenosis - Repeat 1 year. // Carotid US 05/2019: R 1-39; L 40-59; R vertebral with atypical antegrade flow; R subclavian stenosis >> repeat 1 year // Carotid US 7/21: Bilat 40-59; L subclavian stenosis   . CHF (congestive heart failure) ([place]) 01/21/2020  . Chronic diastolic heart failure (HCC)    Echo 9/10: EF 38-46%, grade 1 diastolic dysfunction  . Chronic lower back pain   . COPD (chronic obstructive pulmonary disease) ([place])    Emphysema dxed by Dr. Woody Seller in [place] based on PFTs per pt in 2006; placed on albuterol  . Depression    TAKES CELEXA  AND  (OFF- WELBUTRIN)  . Diuretic-induced hypokalemia 10/08/2018  . DVT of lower extremity (deep venous thrombosis) (HCC)    recurrent. bilateral (2 episodes)  . Dyspnea    home o2 when needed  . Dysrhythmia    afib with cabg  . Exertional angina (HCC)    Treated with Isosorbide, Ranexa, amlodipine; intolerant to metoprolol  . GERD (gastroesophageal reflux disease)   . Gout    "on daily RX" (10/06/2018)  . HLD (hyperlipidemia)   . Hypertension   . Hyperthyroidism   . L Subclavian Artery Stenosis    Carotid US 7/21   . Obesity (BMI 30-39.9) 2009   BMI 33  . Osteoarthritis    "all over; hands" (10/06/2018)  . Oxygen deficiency   . Oxygen dependent    4L at home as needed (10/06/2018)  . Pneumonia   . PVD (peripheral vascular disease) ([place])   . Sleep apnea 2012   USED CPAP THEN  STARTED USING SPIRIVA , Nov. 2013- last evaluation , changed from mask to aparatus that is just for [name] nose.. ; reports 05-28-18 "the mask smothers me so i dont use it right now" (10/06/2018)    Past Surgical History:  Procedure Laterality Date  . ANKLE SURGERY  2004  . ANTERIOR CERVICAL DECOMP/DISCECTOMY FUSION  11/2007  . AUGMENTATION MAMMAPLASTY    . [place]   lower; another scheduled, opt. reports 4 back- lumbar, 3 cerv. fusions   for later 2009  . CARDIAC CATHETERIZATION  11/2009   Patent LIMA to LAD and patent SVG to OM1. Occluded SVG to ramus and diagonal. Left main: 90% ostial stenosis, LCX 60-70% proximal stenosis  . CATARACT EXTRACTION W/ INTRAOCULAR LENS  IMPLANT, BILATERAL Bilateral 2006  . COLONOSCOPY WITH PROPOFOL N/A 02/17/2017   Procedure: COLONOSCOPY WITH PROPOFOL;  Surgeon: Jerene Bears, MD;  Location: [facility] ENDOSCOPY;  Service: Endoscopy;  Laterality: N/A;  . CORONARY ANGIOPLASTY WITH STENT PLACEMENT  11/2009   Drug eluting stent to left main artery: 4.0 X 12 mm Ion   . CORONARY ARTERY BYPASS GRAFT  2004   "CABG X4"  . ESOPHAGOGASTRODUODENOSCOPY N/A 02/14/2017   Procedure: ESOPHAGOGASTRODUODENOSCOPY (EGD);  Surgeon: Doran Stabler, MD;  Location: [facility] ENDOSCOPY;  Service: Endoscopy;  Laterality: N/A;  .  GIVENS CAPSULE STUDY N/A 02/15/2017   Procedure: GIVENS CAPSULE STUDY;  Surgeon: Doran Stabler, MD;  Location: [place];  Service: Endoscopy;  Laterality: N/A;  . GROIN MASS OPEN BIOPSY  2004  . IR IVC FILTER PLMT / S&I /IMG GUID/MOD SED  03/14/2018  . IR PARACENTESIS  03/10/2018  . JOINT REPLACEMENT    . LAPAROSCOPIC CHOLECYSTECTOMY    . PERIPHERAL VASCULAR INTERVENTION Left 03/05/2018   Procedure: PERIPHERAL VASCULAR INTERVENTION;  Surgeon: Elam Dutch, MD;  Location: [place] CV LAB;  Service: Cardiovascular;  Laterality: Left;  Attempted unsuccess\ful Per Dr. Eden Lathe  . PERIPHERAL VASCULAR INTERVENTION  03/08/2018   Procedure: PERIPHERAL VASCULAR INTERVENTION;  Surgeon: Waynetta Sandy, MD;  Location: [place] [facility];  Service: Cardiovascular;;  SMA Stent   . REPLACEMENT TOTAL KNEE BILATERAL Bilateral 2012  . REVERSE SHOULDER ARTHROPLASTY  02/26/2012   Procedure: REVERSE SHOULDER ARTHROPLASTY;  Surgeon: Marin Shutter, MD;  Location: [place];  Service: Orthopedics;  Laterality: Right;  RIGHT SHOULDER REVERSED ARTHROPLASTY  . SHOULDER ARTHROSCOPY WITH SUBACROMIAL DECOMPRESSION Left  02/10/2013   Procedure: SHOULDER ARTHROSCOPY WITH SUBACROMIAL DECOMPRESSION DISTAL CLAVICLE RESECTION;  Surgeon: Marin Shutter, MD;  Location: [place];  Service: Orthopedics;  Laterality: Left;  DISTAL CLAVICLE RESECTION  . TONSILLECTOMY    . TOTAL HIP ARTHROPLASTY  2007   Right  . TOTAL HIP ARTHROPLASTY Left 02/07/2020   Procedure: TOTAL HIP ARTHROPLASTY ANTERIOR APPROACH;  Surgeon: Paralee Cancel, MD;  Location: [facility];  Service: Orthopedics;  Laterality: Left;  70 mins  . TRANSURETHRAL RESECTION OF BLADDER TUMOR N/A 05/31/2018   Procedure: TRANSURETHRAL RESECTION OF BLADDER TUMOR (TURBT);  Surgeon: Lucas Mallow, MD;  Location: [facility];  Service: Urology;  Laterality: N/A;  . VISCERAL ANGIOGRAPHY N/A 03/05/2018   Procedure: VISCERAL ANGIOGRAPHY;  Surgeon: Elam Dutch, MD;  Location: [place] [facility];  Service: Cardiovascular;  Laterality: N/A;  . VISCERAL ANGIOGRAPHY N/A 03/08/2018   Procedure: VISCERAL ANGIOGRAPHY;  Surgeon: Waynetta Sandy, MD;  Location: [place] [facility];  Service: Cardiovascular;  Laterality: N/A;     Home Medications:  Prior to Admission medications   Medication Sig Start Date End Date Taking? Authorizing Provider  acetaminophen (TYLENOL) 500 MG tablet Take 2 tablets (1,000 mg total) by mouth every 8 (eight) hours. 02/08/20   Danae Orleans, PA-C  allopurinol (ZYLOPRIM) 100 MG tablet Take 100 mg by mouth daily. 01/30/17   [provider]  ARIPiprazole (ABILIFY) 2 MG tablet Take 2 mg by mouth daily. 12/29/19   [provider]  ascorbic acid (VITAMIN C) 500 MG tablet Take 500 mg by mouth daily.    [provider]  atorvastatin (LIPITOR) 10 MG tablet Take 10 mg by mouth daily.  10/24/19   [provider]  citalopram (CELEXA) 40 MG tablet Take 40 mg by mouth at bedtime. 08/30/19   [provider]  cyclobenzaprine (FLEXERIL) 10 MG tablet Take 10 mg by mouth 3 (three) times daily as needed for muscle spasms.      [provider]  docusate sodium (COLACE) 100 MG capsule Take 1 capsule (100 mg total) by mouth 2 (two) times daily. 02/08/20   Danae Orleans, PA-C  ferrous sulfate (FERROUSUL) 325 (65 FE) MG tablet Take 1 tablet (325 mg total) by mouth 3 (three) times daily with meals for 14 days. 02/08/20 02/22/20  Danae Orleans, PA-C  furosemide (LASIX) 40 MG tablet Take two tablets by mouth every Monday and Thursday  afternoon.  Take one tablet all other afternoons. 03/13/20   Fay Records, MD  gabapentin (NEURONTIN) 100 MG capsule Take 100 mg by mouth daily. 01/09/20   [provider]  guaiFENesin (MUCINEX) 600 MG 12 hr tablet Take 600 mg by mouth 2 (two) times daily.     [provider]  INCRUSE ELLIPTA 62.5 MCG/INH AEPB Inhale 1 puff into the lungs 2 (two) times daily.  11/30/16   [provider]  Ipratropium-Albuterol (COMBIVENT) 20-100 MCG/ACT AERS respimat Inhale 1 puff into the lungs every 6 (six) hours as needed for wheezing or shortness of breath (or to help expel mucous).     [provider]  levothyroxine (SYNTHROID) 50 MCG tablet Take 1 tablet (50 mcg total) by mouth daily at 6 (six) AM. 09/13/20   Fay Records, MD  magnesium oxide (MAG-OX) 400 (241.3 Mg) MG tablet Take 1 tablet (400 mg total) by mouth 2 (two) times daily. 03/26/18   Mikhail, Velta Addison, DO  Melatonin 10 MG TABS Take 10 mg by mouth at bedtime.     [provider]  metolazone (ZAROXOLYN) 2.5 MG tablet  08/22/20   [provider]  neomycin-polymyxin-hydrocortisone (CORTISPORIN) 3.5-10000-1 OTIC suspension Place 5 drops into the right ear daily as needed (ear pain).  08/31/19   [provider]  nitroGLYCERIN (NITROSTAT) 0.4 MG SL tablet Place 1 tablet (0.4 mg total) under the tongue every 5 (five) minutes as needed for chest pain. For chest pain 04/24/16   Fay Records, MD  nystatin (MYCOSTATIN) 100000 UNIT/ML suspension  08/14/20   [provider]  ofloxacin (FLOXIN) 0.3  % OTIC solution  04/13/20   [provider]  omeprazole (PRILOSEC) 40 MG capsule Take 1 capsule (40 mg total) by mouth 2 (two) times daily. 12/19/19   Esterwood, Amy S, PA-C  oxyCODONE 10 MG TABS Take 1-2 tablets (10-20 mg total) by mouth every 4 (four) hours as needed for moderate pain or severe pain. 02/08/20   Danae Orleans, PA-C  OXYGEN Inhale 4 L/min into the lungs as needed (DURING ALL TIMES OF EXERTION).     [provider]  pilocarpine (PILOCAR) 1 % ophthalmic solution Place 1 drop into both eyes 2 (two) times daily.    [provider]  polyethylene glycol (MIRALAX / GLYCOLAX) 17 g packet Take 17 g by mouth 2 (two) times daily. 02/08/20   Danae Orleans, PA-C  potassium chloride (KLOR-CON) 10 MEQ tablet Take 2 tablets (20 mEq total) by mouth daily. Take 2 additional tablets every Monday and Thursday. 03/13/20   Fay Records, MD  pramipexole (MIRAPEX) 0.25 MG tablet  08/22/20   [provider]  prednisoLONE acetate (PRED FORTE) 1 % ophthalmic suspension  04/13/20   [provider]  predniSONE (DELTASONE) 20 MG tablet  08/03/20   [provider]  rivaroxaban (XARELTO) 10 MG TABS tablet Take 1 tablet (10 mg total) by mouth daily for 14 days. 02/09/20 02/23/20  Danae Orleans, PA-C  roflumilast (DALIRESP) 500 MCG TABS tablet Take 500 mcg by mouth every evening.     [provider]  SPIRIVA RESPIMAT 1.25 MCG/ACT AERS  08/24/20   [provider]  spironolactone (ALDACTONE) 25 MG tablet TAKE 1 TABLET BY MOUTH DAILY 11/25/19   Fay Records, MD  traZODone (DESYREL) 50 MG tablet Take 150 mg by mouth at bedtime.  10/07/18   [provider]    Inpatient Medications: Scheduled Meds: . heparin  2,000 Units Intravenous Once  Continuous Infusions: . heparin 1,100 Units/hr (10/09/20 1545)   PRN Meds:   Allergies:    Allergies  Allergen Reactions  . Ativan [Lorazepam] Other (See Comments)    Confusion   . Pitavastatin  Other (See Comments)    LIVALO: reaction not recalled  . Ropinirole Nausea Only and Other (See Comments)    Makes legs jump, also  . Zofran [Ondansetron Hcl] Nausea Only  . Zolpidem Tartrate Anxiety and Other (See Comments)    CONFUSION   . Penicillins Rash and Other (See Comments)    Tolerated cephalosporins in past Has patient had a PCN reaction causing immediate rash, facial/tongue/throat swelling, SOB or lightheadedness with hypotension: Yes Has patient had a PCN reaction causing severe rash involving mucus membranes or skin necrosis: No Has patient had a PCN reaction that required hospitalization: No Has patient had a PCN reaction occurring within the last 10 years: No If all of the above answers are "NO", then may proceed with Cephalosporin use.     Social History:   Social History   Socioeconomic History  . Marital status: Widowed    Spouse name: Not on file  . Number of children: 3  . Years of education: Not on file  . Highest education level: Not on file  Occupational History  . Occupation: retired    Comment: Electronics engineer: RETIRED  Tobacco Use  . Smoking status: Former Smoker    Packs/day: 1.00    Years: 45.00    Pack years: 45.00    Types: Cigarettes    Quit date: 12/22/2000    Years since quitting: 19.8  . Smokeless tobacco: Never Used  Vaping Use  . Vaping Use: Never used  Substance and Sexual Activity  . Alcohol use: Never    Alcohol/week: 0.0 standard drinks  . Drug use: Never  . Sexual activity: Not Currently  Other Topics Concern  . Not on file  Social History Narrative      3 grown children.   Social Determinants of Health   Financial Resource Strain:   . Difficulty of Paying Living Expenses: Not on file  Food Insecurity: No Food Insecurity  . Worried About Charity fundraiser in the Last Year: Never true  . Ran Out of Food in the Last Year: Never true  Transportation Needs: No Transportation Needs  . Lack of Transportation  (Medical): No  . Lack of Transportation (Non-Medical): No  Physical Activity:   . Days of Exercise per Week: Not on file  . Minutes of Exercise per Session: Not on file  Stress:   . Feeling of Stress : Not on file  Social Connections:   . Frequency of Communication with Friends and Family: Not on file  . Frequency of Social Gatherings with Friends and Family: Not on file  . Attends Religious Services: Not on file  . Active Member of Clubs or Organizations: Not on file  . Attends Archivist Meetings: Not on file  . Marital Status: Not on file  Intimate Partner Violence:   . Fear of Current or Ex-Partner: Not on file  . Emotionally Abused: Not on file  . Physically Abused: Not on file  . Sexually Abused: Not on file    Family History:    Family History  Problem Relation Age of Onset  . Ovarian cancer Mother   . Lung cancer Father   . COPD Father   . Heart disease Father   . Heart disease Sister   .  Stomach cancer Sister   . Colitis Sister      ROS:  Please see the history of present illness.  All other ROS reviewed and negative.     Physical Exam/Data:   Vitals:   10/09/20 1421 10/09/20 1422 10/09/20 1437 10/09/20 1500  BP: (!) 92/47  (!) 87/53 (!) 92/42  Pulse: 95 95 89 90  Resp: 20 20 (!) 26 (!) 27  Temp:   98.7 F (37.1 C)   TempSrc:   Oral   SpO2: 97%  94% 94%  Weight:      Height:        Intake/Output Summary (Last 24 hours) at 10/09/2020 1545 Last data filed at 10/09/2020 1448 Gross per 24 hour  Intake 1099 ml  Output --  Net 1099 ml   Last 3 Weights 10/09/2020 09/10/2020 03/08/2020  Weight (lbs) 167 lb 8.8 oz 166 lb 165 lb 9.6 oz  Weight (kg) 76 kg 75.297 kg 75.116 kg     Body mass index is 25.48 kg/m.  General: 79 y.o. female resting comfortably in no acute distress. HEENT: Normocephalic and atraumatic. Sclera clear.  Neck: Supple. Possible elevated JVD. Heart: RRR. Distinct S1 and S2. II-III systolic murmur. No gallops or rubs. Radial   pulses 2+ and equal bilaterally. Lungs: Decreased breath sounds throughout with some wheezes/rhonchi. No significant crackles appreciated.  Abdomen: Soft, non-distended, and non-tender to palpation.  Extremities: Mild lower extremity edema bilateral (right may be slightly larger than left) Skin: Warm and dry. Neuro: Alert and oriented x3. No focal deficits. Psych: Normal affect. Responds appropriately.  EKG:  The EKG was personally reviewed and demonstrates: Normal sinus rhythm, rate 84 bpm, with LVH and PVC but no acute ischemic changes compared to prior tracings. QTc 499 ms.  Telemetry:  Telemetry was personally reviewed and demonstrates:  Normal sinus rhythm with rates ranging from the 80's to 120's. PVCs noted sometimes in bigeminy pattern.  Relevant CV Studies:  Echocardiogram 10/09/2020: Impressions: 1. Diastology suggestive of impaired LV filling. Left ventricular  ejection fraction, by estimation, is 60 to 65%. The left ventricle has  normal function. The left ventricle has no regional wall motion  abnormalities. There is moderate concentric left  ventricular hypertrophy. Left ventricular diastolic parameters are  indeterminate.  2. Right ventricular systolic function is low normal. The right  ventricular size is normal. There is normal pulmonary artery systolic  pressure.  3. Left atrial size was moderately dilated.  4. Right atrial size was moderately dilated.  5. The mitral valve is myxomatous. No evidence of mitral valve  regurgitation. The mean mitral valve gradient is 5.0 mmHg with average  heart rate of 87 bpm. Moderate mitral annular calcification.  6. The aortic valve is tricuspid. Aortic valve regurgitation is not  visualized.  7. The inferior vena cava is dilated in size with <50% respiratory  variability, suggesting right atrial pressure of 15 mmHg.   Comparison(s): A prior study was performed on 10/05/2019. No significant  change from prior study.  Subtle decrease in RV function.   Laboratory Data:  High Sensitivity Troponin:   Recent Labs  Lab 10/09/20 1141 10/09/20 1355  TROPONINIHS 36* 41*     Chemistry Recent Labs  Lab 10/09/20 1141  NA 138  K 4.1  CL 97*  CO2 28  GLUCOSE 117*  BUN 46*  CREATININE 1.97*  CALCIUM 9.2  GFRNONAA 23*  ANIONGAP 13    No results for input(s): PROT, ALBUMIN, AST, ALT, ALKPHOS, BILITOT in the last  168 hours. Hematology Recent Labs  Lab 10/09/20 1141  WBC 18.0*  RBC 3.57*  HGB 10.6*  HCT 33.5*  MCV 93.8  MCH 29.7  MCHC 31.6  RDW 15.1  PLT 280   BNP Recent Labs  Lab 10/09/20 1141  BNP 491.4*    DDimer No results for input(s): DDIMER in the last 168 hours.   Radiology/Studies:  DG Chest Port 1 View  Result Date: 10/09/2020 CLINICAL DATA:  Shortness of breath, cough and fever. EXAM: PORTABLE CHEST 1 VIEW COMPARISON:  10/05/2019 FINDINGS: Previous median sternotomy and CABG. Mild cardiomegaly. Aortic atherosclerosis. Bilateral breast implants with density overlying the chest. Suspicion of bronchitis and hazy pneumonia in the mid and lower lungs. No dense consolidation, lobar collapse or effusion. Old shoulder replacement on the right. Chronic degenerative changes of the left shoulder. IMPRESSION: 1. Suspicion of bronchitis and hazy pneumonia in the mid and lower lungs. No dense consolidation or lobar collapse. 2. Previous CABG. Mild cardiomegaly. Aortic atherosclerosis. Electronically Signed   By: Nelson Chimes M.D.   On: 10/09/2020 13:15     Assessment and Plan:   Acute on Chronic Hypoxic Respiratory Failure  Community Acquired Pneumonia COPD on 4L of O2 at Home - Patient presented with worsening shortness of breath, productive cough, and fever at home. O2 sats noted to be in the 70's on room air at urgent care.  - Chest x-ray showed suspicious for bronchitis and mid and lower lobe pneumonia.  - D-dimer elevated at 8.76. Unable to get chest CTA at this time due to AKI. -  BNP elevated at 491.  - Started on antibiotics for CAP per primary team.  - Started on IV Heparin for suspected PE. - May be some diastolic CHF component. See below.  Possible Acute on Chronic Diastolic CHF - BNP elevated at 491.  - No overt edema of chest x-ray.  - Echo showed LVEF of 60-65% with moderate LVH and impaired LV filling pressure. May be some subtle decrease in RV function. Mitral valve myxomatous but no MR noted.  - Does note appear volume overloaded on exam and with soft BP would hold any diuretics right now. - Continue to monitor volume status closely.  Chest Pain CAD s/p CABG in 2004 with Subsequent DES to Left main in 2010 Demand Ischemia - Patient does note some central/left sided chest tightness. - EKG shows no acute ischemic changes.  - High-sensitivity troponin mildly elevated and flat at 36 >> 41. Not consistent with ACS. Suspect demand ischemic in setting of acute on chronic hypoxic respiratory failure, CAP, and possible PE. -  Do not see Aspirin on PTA list but would recommend Aspirin 81mg  daily. Continue statin.   Recurrent PE/DVT s/p IVC Filter - History of recurrent PE/DVT/ S/p IVC filter in 02/2018. Not on anticoagulation due to GI bleed and prior large abdominal wall hematoma. - D-dimer elevated as above.  - Suspect patient has another PE. Unfortunately, cannot perform CTA at this time due to renal function. Started on IV Heparin. Consider chest CTA if renal function improves.  Hypotensive - History of hypertension but has been hypotensive here. BP as low as 86/47.  - S/p 2.5L of lactated ringers. Most recent BP 110/58.  - Home Lasix and Spironolactone on hold.  Acute on Chronic Renal Function - Creatinine 1.97 on admission. Baseline around 1.2. - Possible prerenal with hypotension. - Continue to monitor closely.  Otherwise, per primary team.  New York Heart Association (NYHA) Functional Class NYHA Class III.  Complicated by patients COPD.    For  questions or updates, please contact [place] Please consult [email or web address] for contact info under    Signed, Darreld Mclean, PA-C  10/09/2020 3:45 PM   I have seen and examined the patient and agree with Sande Rives, PA.  In brief, Ms. Vanorman is a 79 y.o. female with a history of CAD s/p CABG in 2004 with subsequent DES to left main in 6160, chronic diastolic CHF, carotid artery disease, PAD s/p SMA stenting in 2019, recurrent DVT/PE s/p IVC filter in3/2019not on anticoagulation due to GI bleed and prior large abdominal wall hematoma, COPD on home O2, cirrhosis, hypertension, and HLD who presented with several week history of worsening SOB, cough and fever. Cardiology is consulted given concern for acute on chronic diastolic heart failure.  Suspect patient's presentation is multifactorial in the setting of pneumonia, underlying COPD, diastolic dysfunction and potentially a PE. On exam, [name] appears mildly overloaded with elevated JVP and RAP>53mmHg on TTE. I am concerned, however, that [name] RV looks a bit hypokinetic compared to prior echo with elevated d-dimer and possible PE. CXR also notable for pneumonia. Would recommend treating underlying pneumonia, limiting further IVF administration, and continue work-up/treatment of PE. Would not aggressively diurese at this time due to soft blood pressures and do not want to drop preload too much before we rule-out PE.  Exam: General: Sitting up, coughing, appears tachypneic but able to speak in full sentences CV: RR, no murmurs appreciated Resp: Diffuse rhonchi and wheezing. Reduced air movement Abdo: Soft, ND, NTTP Extremities: Warm, trace edema Neuro: AAOx3  Plan: -Suspect main driver of SOB is pneumonia, COPD and possible PE -Would hold further IVF but no diuresis yet as want to rule-out PE first-->do not want to drop pre-load if significant thrombus burden -Agree with heparin gtt for [facility] for suspected PE; CTA when Cr  improved -ABX for pneumonia -Management COPD per primary team -Monitor I/Os and daily weights  Gwyndolyn Kaufman, MD

## 2020-10-10 ENCOUNTER — Inpatient Hospital Stay (HOSPITAL_COMMUNITY): Payer: Medicare HMO

## 2020-10-10 ENCOUNTER — Ambulatory Visit: Payer: Self-pay

## 2020-10-10 DIAGNOSIS — I5033 Acute on chronic diastolic (congestive) heart failure: Secondary | ICD-10-CM

## 2020-10-10 DIAGNOSIS — R7989 Other specified abnormal findings of blood chemistry: Secondary | ICD-10-CM

## 2020-10-10 LAB — BASIC METABOLIC PANEL
Anion gap: 12 (ref 5–15)
BUN: 52 mg/dL — ABNORMAL HIGH (ref 8–23)
CO2: 24 mmol/L (ref 22–32)
Calcium: 8.2 mg/dL — ABNORMAL LOW (ref 8.9–10.3)
Chloride: 97 mmol/L — ABNORMAL LOW (ref 98–111)
Creatinine, Ser: 2.19 mg/dL — ABNORMAL HIGH (ref 0.44–1.00)
GFR, Estimated: 21 mL/min — ABNORMAL LOW (ref >60)
Glucose, Bld: 197 mg/dL — ABNORMAL HIGH (ref 70–99)
Potassium: 3.8 mmol/L (ref 3.5–5.1)
Sodium: 133 mmol/L — ABNORMAL LOW (ref 135–145)

## 2020-10-10 LAB — CBC
HCT: 29 % — ABNORMAL LOW (ref 36.0–46.0)
Hemoglobin: 9.2 g/dL — ABNORMAL LOW (ref 12.0–15.0)
MCH: 29.9 pg (ref 26.0–34.0)
MCHC: 31.7 g/dL (ref 30.0–36.0)
MCV: 94.2 fL (ref 80.0–100.0)
Platelets: 249 10*3/uL (ref 150–400)
RBC: 3.08 MIL/uL — ABNORMAL LOW (ref 3.87–5.11)
RDW: 15.3 % (ref 11.5–15.5)
WBC: 16.4 10*3/uL — ABNORMAL HIGH (ref 4.0–10.5)
nRBC: 0 % (ref 0.0–0.2)

## 2020-10-10 LAB — URINALYSIS, ROUTINE W REFLEX MICROSCOPIC
Bilirubin Urine: NEGATIVE
Glucose, UA: NEGATIVE mg/dL
Hgb urine dipstick: NEGATIVE
Ketones, ur: NEGATIVE mg/dL
Leukocytes,Ua: NEGATIVE
Nitrite: NEGATIVE
Protein, ur: NEGATIVE mg/dL
Specific Gravity, Urine: 1.014 (ref 1.005–1.030)
pH: 5 (ref 5.0–8.0)

## 2020-10-10 LAB — HEPARIN LEVEL (UNFRACTIONATED)
Heparin Unfractionated: 0.23 IU/mL — ABNORMAL LOW (ref 0.30–0.70)
Heparin Unfractionated: 0.3 IU/mL (ref 0.30–0.70)
Heparin Unfractionated: <0.1 IU/mL — ABNORMAL LOW (ref 0.30–0.70)

## 2020-10-10 LAB — TROPONIN I (HIGH SENSITIVITY): Troponin I (High Sensitivity): 47 ng/L — ABNORMAL HIGH (ref <18)

## 2020-10-10 LAB — MRSA PCR SCREENING: MRSA by PCR: NEGATIVE

## 2020-10-10 MED ORDER — GUAIFENESIN 100 MG/5ML PO SOLN
5.0000 mL | ORAL | Status: DC | PRN
  Administered 2020-10-10: 100 mg via ORAL
  Filled 2020-10-10: qty 10

## 2020-10-10 MED ORDER — PILOCARPINE HCL 1 % OP SOLN
1.0000 [drp] | Freq: Two times a day (BID) | OPHTHALMIC | Status: DC
  Administered 2020-10-10 – 2020-10-13 (×7): 1 [drp] via OPHTHALMIC
  Filled 2020-10-10 (×2): qty 15

## 2020-10-10 MED ORDER — PRAMIPEXOLE DIHYDROCHLORIDE 0.25 MG PO TABS
0.2500 mg | ORAL_TABLET | Freq: Every evening | ORAL | Status: DC
  Administered 2020-10-10 – 2020-10-13 (×4): 0.25 mg via ORAL
  Filled 2020-10-10 (×4): qty 1

## 2020-10-10 MED ORDER — CHLORHEXIDINE GLUCONATE CLOTH 2 % EX PADS
6.0000 | MEDICATED_PAD | Freq: Every day | CUTANEOUS | Status: DC
  Administered 2020-10-10: 6 via TOPICAL

## 2020-10-10 MED ORDER — ALLOPURINOL 100 MG PO TABS
100.0000 mg | ORAL_TABLET | Freq: Every day | ORAL | Status: DC
  Administered 2020-10-10 – 2020-10-13 (×4): 100 mg via ORAL
  Filled 2020-10-10 (×4): qty 1

## 2020-10-10 MED ORDER — SODIUM CHLORIDE 0.9 % IV BOLUS
500.0000 mL | Freq: Once | INTRAVENOUS | Status: AC
  Administered 2020-10-10: 500 mL via INTRAVENOUS

## 2020-10-10 MED ORDER — IPRATROPIUM-ALBUTEROL 0.5-2.5 (3) MG/3ML IN SOLN
3.0000 mL | Freq: Three times a day (TID) | RESPIRATORY_TRACT | Status: DC
  Administered 2020-10-10 – 2020-10-12 (×7): 3 mL via RESPIRATORY_TRACT
  Filled 2020-10-10 (×7): qty 3

## 2020-10-10 MED ORDER — PHENYLEPHRINE HCL-NACL 10-0.9 MG/250ML-% IV SOLN
25.0000 ug/min | INTRAVENOUS | Status: DC
  Administered 2020-10-10: 25 ug/min via INTRAVENOUS
  Administered 2020-10-10: 85.333 ug/min via INTRAVENOUS
  Administered 2020-10-10: 45 ug/min via INTRAVENOUS
  Administered 2020-10-10 (×2): 95 ug/min via INTRAVENOUS
  Administered 2020-10-10: 80 ug/min via INTRAVENOUS
  Administered 2020-10-10 (×2): 95 ug/min via INTRAVENOUS
  Administered 2020-10-11: 75 ug/min via INTRAVENOUS
  Administered 2020-10-11: 10 ug/min via INTRAVENOUS
  Administered 2020-10-11: 60 ug/min via INTRAVENOUS
  Filled 2020-10-10 (×4): qty 250
  Filled 2020-10-10: qty 500
  Filled 2020-10-10: qty 250
  Filled 2020-10-10: qty 500
  Filled 2020-10-10 (×2): qty 250

## 2020-10-10 MED ORDER — SODIUM CHLORIDE 0.9 % IV SOLN
250.0000 mL | INTRAVENOUS | Status: DC
  Administered 2020-10-10 – 2020-10-12 (×2): 250 mL via INTRAVENOUS

## 2020-10-10 MED ORDER — CHLORHEXIDINE GLUCONATE 0.12 % MT SOLN
15.0000 mL | Freq: Two times a day (BID) | OROMUCOSAL | Status: DC
  Administered 2020-10-10 – 2020-10-13 (×8): 15 mL via OROMUCOSAL
  Filled 2020-10-10 (×7): qty 15

## 2020-10-10 MED ORDER — CITALOPRAM HYDROBROMIDE 20 MG PO TABS
40.0000 mg | ORAL_TABLET | Freq: Every day | ORAL | Status: DC
  Administered 2020-10-10 – 2020-10-12 (×3): 40 mg via ORAL
  Filled 2020-10-10 (×3): qty 2

## 2020-10-10 MED ORDER — POLYETHYLENE GLYCOL 3350 17 G PO PACK
17.0000 g | PACK | Freq: Every day | ORAL | Status: DC
  Administered 2020-10-10 – 2020-10-11 (×2): 17 g via ORAL
  Filled 2020-10-10: qty 1

## 2020-10-10 MED ORDER — ASCORBIC ACID 500 MG PO TABS
500.0000 mg | ORAL_TABLET | Freq: Every day | ORAL | Status: DC
  Administered 2020-10-10 – 2020-10-13 (×4): 500 mg via ORAL
  Filled 2020-10-10 (×4): qty 1

## 2020-10-10 MED ORDER — ASPIRIN 81 MG PO CHEW
81.0000 mg | CHEWABLE_TABLET | Freq: Every day | ORAL | Status: DC
  Administered 2020-10-10 – 2020-10-13 (×4): 81 mg via ORAL
  Filled 2020-10-10 (×4): qty 1

## 2020-10-10 MED ORDER — HEPARIN BOLUS VIA INFUSION
1000.0000 [IU] | Freq: Once | INTRAVENOUS | Status: AC
  Administered 2020-10-10: 1000 [IU] via INTRAVENOUS
  Filled 2020-10-10: qty 1000

## 2020-10-10 MED ORDER — HEPARIN (PORCINE) 25000 UT/250ML-% IV SOLN
1500.0000 [IU]/h | INTRAVENOUS | Status: DC
  Administered 2020-10-11: 1500 [IU]/h via INTRAVENOUS
  Filled 2020-10-10: qty 250

## 2020-10-10 MED ORDER — ARIPIPRAZOLE 2 MG PO TABS
2.0000 mg | ORAL_TABLET | Freq: Every day | ORAL | Status: DC
  Administered 2020-10-10 – 2020-10-13 (×4): 2 mg via ORAL
  Filled 2020-10-10 (×5): qty 1

## 2020-10-10 MED ORDER — ORAL CARE MOUTH RINSE
15.0000 mL | Freq: Two times a day (BID) | OROMUCOSAL | Status: DC
  Administered 2020-10-10 – 2020-10-13 (×5): 15 mL via OROMUCOSAL

## 2020-10-10 MED ORDER — NITROGLYCERIN 0.4 MG SL SUBL
0.4000 mg | SUBLINGUAL_TABLET | SUBLINGUAL | Status: DC | PRN
  Administered 2020-10-10 (×2): 0.4 mg via SUBLINGUAL
  Filled 2020-10-10 (×2): qty 1

## 2020-10-10 MED ORDER — FERROUS SULFATE 325 (65 FE) MG PO TABS
325.0000 mg | ORAL_TABLET | Freq: Every day | ORAL | Status: DC
  Administered 2020-10-11 – 2020-10-13 (×3): 325 mg via ORAL
  Filled 2020-10-10 (×3): qty 1

## 2020-10-10 MED ORDER — FERROUS SULFATE 325 (65 FE) MG PO TABS: 325.0000 mg | ORAL_TABLET | Freq: Every day | ORAL | Status: DC

## 2020-10-10 MED ORDER — LACTATED RINGERS IV BOLUS: 500.0000 mL | Freq: Once | INTRAVENOUS | Status: DC

## 2020-10-10 MED ORDER — PANTOPRAZOLE SODIUM 40 MG PO TBEC
40.0000 mg | DELAYED_RELEASE_TABLET | Freq: Every day | ORAL | Status: DC
  Administered 2020-10-10 – 2020-10-12 (×3): 40 mg via ORAL
  Filled 2020-10-10 (×3): qty 1

## 2020-10-10 MED ORDER — BENZONATATE 100 MG PO CAPS
100.0000 mg | ORAL_CAPSULE | Freq: Three times a day (TID) | ORAL | Status: DC | PRN
  Administered 2020-10-10 – 2020-10-13 (×6): 100 mg via ORAL
  Filled 2020-10-10 (×6): qty 1

## 2020-10-10 MED ORDER — MORPHINE SULFATE (PF) 2 MG/ML IV SOLN
1.0000 mg | Freq: Once | INTRAVENOUS | Status: AC | PRN
  Administered 2020-10-10: 1 mg via INTRAVENOUS

## 2020-10-10 MED ORDER — MAGNESIUM OXIDE 400 (241.3 MG) MG PO TABS
400.0000 mg | ORAL_TABLET | Freq: Two times a day (BID) | ORAL | Status: DC
  Administered 2020-10-10 – 2020-10-13 (×7): 400 mg via ORAL
  Filled 2020-10-10 (×7): qty 1

## 2020-10-10 MED ORDER — ROFLUMILAST 500 MCG PO TABS
500.0000 ug | ORAL_TABLET | Freq: Every evening | ORAL | Status: DC
  Administered 2020-10-10 – 2020-10-13 (×4): 500 ug via ORAL
  Filled 2020-10-10 (×4): qty 1

## 2020-10-10 MED ORDER — MORPHINE SULFATE (PF) 2 MG/ML IV SOLN
1.0000 mg | INTRAVENOUS | Status: DC | PRN
  Administered 2020-10-10 – 2020-10-13 (×12): 1 mg via INTRAVENOUS
  Filled 2020-10-10 (×13): qty 1

## 2020-10-10 MED ORDER — HEPARIN (PORCINE) 25000 UT/250ML-% IV SOLN: 1450.0000 [IU]/h | INTRAVENOUS | Status: DC

## 2020-10-10 MED ORDER — ALBUMIN HUMAN 25 % IV SOLN
25.0000 g | Freq: Once | INTRAVENOUS | Status: DC
  Filled 2020-10-10: qty 100

## 2020-10-10 MED ORDER — HEPARIN BOLUS VIA INFUSION
2000.0000 [IU] | Freq: Once | INTRAVENOUS | Status: AC
  Administered 2020-10-10: 2000 [IU] via INTRAVENOUS
  Filled 2020-10-10: qty 2000

## 2020-10-10 NOTE — Progress Notes (Addendum)
Progress Note  Patient Name: [name] Date of Encounter: 10/10/2020  [facility] Cardiologist: Dorris Carnes, MD   Subjective   Patient was started on Phenelyphine last night due to persistent hypotension with systolic BP in the 68'T. Systolic BP in the 41'D this morning. Patient looks better this morning. Coughing has improved and she states she is breathing better today. She does note diffuse chest/back discomfort this morning that she describes as a "dull tooth ache." It was 10/10 earlier this morning and she was given IV Morphine. She currently ranks it as a 8/10 but she does not look like she is in pain. She also notes that it is reproducible when I push on her chest wall. She thinks it may be from coughing much yesterday.  Inpatient Medications    Scheduled Meds: . allopurinol  100 mg Oral Daily  . arformoterol  15 mcg Nebulization BID  . ARIPiprazole  2 mg Oral Daily  . ascorbic acid  500 mg Oral Daily  . aspirin  81 mg Oral Daily  . atorvastatin  10 mg Oral Daily  . budesonide (PULMICORT) nebulizer solution  0.5 mg Nebulization BID  . chlorhexidine  15 mL Mouth Rinse BID  . Chlorhexidine Gluconate Cloth  6 each Topical Daily  . citalopram  40 mg Oral QHS  . doxycycline  100 mg Oral Q12H  . [START ON 10/11/2020] ferrous sulfate  325 mg Oral Q breakfast  . gabapentin  100 mg Oral Daily  . ipratropium-albuterol  3 mL Nebulization TID  . levothyroxine  50 mcg Oral Q0600  . magnesium oxide  400 mg Oral BID  . mouth rinse  15 mL Mouth Rinse q12n4p  . melatonin  10 mg Oral QHS  . pantoprazole  40 mg Oral QHS  . pilocarpine  1 drop Both Eyes BID  . polyethylene glycol  17 g Oral Daily  . pramipexole  0.25 mg Oral QPM  . predniSONE  40 mg Oral Q breakfast  . roflumilast  500 mcg Oral QPM  . sodium chloride flush  3 mL Intravenous Q12H  . traZODone  150 mg Oral QHS   Continuous Infusions: . sodium chloride 10 mL/hr at 10/10/20 0600  . cefTRIAXone (ROCEPHIN)  IV  Stopped (10/09/20 2023)  . heparin 1,450 Units/hr (10/10/20 1009)  . phenylephrine (NEO-SYNEPHRINE) Adult infusion 45 mcg/min (10/10/20 1019)   PRN Meds: acetaminophen **OR** acetaminophen, albuterol, benzonatate, morphine injection, nitroGLYCERIN   Vital Signs    Vitals:   10/10/20 0600 10/10/20 0800 10/10/20 0814 10/10/20 0900  BP: (!) 108/45 (!) 105/32  (!) 112/51  Pulse: 93 91  (!) 112  Resp: 20 17  19   Temp:  98.9 F (37.2 C)    TempSrc:  Axillary    SpO2: 94% 94% 94% 91%  Weight:      Height:        Intake/Output Summary (Last 24 hours) at 10/10/2020 1118 Last data filed at 10/10/2020 0600 Gross per 24 hour  Intake 2585.21 ml  Output 0 ml  Net 2585.21 ml   Last 3 Weights 10/10/2020 10/09/2020 09/10/2020  Weight (lbs) 166 lb 0.1 oz 167 lb 8.8 oz 166 lb  Weight (kg) 75.3 kg 76 kg 75.297 kg      Telemetry    Sinus rhythm/sinus tachycardia rates in the 90's to 110's. PVCs and PACs noted. PVCs sometimes in bigeminy pattern. - Personally Reviewed  ECG    Sinus tachycardia with T wave inversion in aVL and mild  ST depression in lead I which is new from yesterday. - Personally Reviewed  Physical Exam   GEN: No acute distress. On 4L via nasal cannula. Neck: JVD elevated. Cardiac: Tachycardic with regular rhythm. II-III systolic murmur. No rubs or gallops.  Respiratory: Decreased breath sounds/air movement bilaterally. Faint wheezing. Very faint crackles in bases. GI: Soft, non-tender, non-distended. MS: Trace lower extremity in right lower extremity (chronic). No deformity. Neuro:  No focal deficits. Psych: Normal affect. Responds appropriately.  Labs    High Sensitivity Troponin:   Recent Labs  Lab 10/09/20 1141 10/09/20 1355  TROPONINIHS 36* 41*      Chemistry Recent Labs  Lab 10/09/20 1141 10/10/20 0245  NA 138 133*  K 4.1 3.8  CL 97* 97*  CO2 28 24  GLUCOSE 117* 197*  BUN 46* 52*  CREATININE 1.97* 2.19*  CALCIUM 9.2 8.2*  GFRNONAA 23* 21*    ANIONGAP 13 12     Hematology Recent Labs  Lab 10/09/20 1141 10/10/20 0245  WBC 18.0* 16.4*  RBC 3.57* 3.08*  HGB 10.6* 9.2*  HCT 33.5* 29.0*  MCV 93.8 94.2  MCH 29.7 29.9  MCHC 31.6 31.7  RDW 15.1 15.3  PLT 280 249    BNP Recent Labs  Lab 10/09/20 1141  BNP 491.4*     DDimer  Recent Labs  Lab 10/09/20 1141  DDIMER 8.76*     Radiology    DG CHEST PORT 1 VIEW  Result Date: 10/10/2020 CLINICAL DATA:  Dyspnea. EXAM: PORTABLE CHEST 1 VIEW COMPARISON:  October 09, 2020. FINDINGS: Stable cardiomediastinal silhouette. Status post coronary bypass graft. No pneumothorax or pleural effusion is noted. No acute pulmonary disease is noted. Status post right shoulder arthroplasty. IMPRESSION: No active disease. Aortic Atherosclerosis (ICD10-I70.0). Electronically Signed   By: Marijo Conception M.D.   On: 10/10/2020 10:13   DG Chest Port 1 View  Result Date: 10/09/2020 CLINICAL DATA:  Shortness of breath, cough and fever. EXAM: PORTABLE CHEST 1 VIEW COMPARISON:  10/05/2019 FINDINGS: Previous median sternotomy and CABG. Mild cardiomegaly. Aortic atherosclerosis. Bilateral breast implants with density overlying the chest. Suspicion of bronchitis and hazy pneumonia in the mid and lower lungs. No dense consolidation, lobar collapse or effusion. Old shoulder replacement on the right. Chronic degenerative changes of the left shoulder. IMPRESSION: 1. Suspicion of bronchitis and hazy pneumonia in the mid and lower lungs. No dense consolidation or lobar collapse. 2. Previous CABG. Mild cardiomegaly. Aortic atherosclerosis. Electronically Signed   By: Nelson Chimes M.D.   On: 10/09/2020 13:15   ECHOCARDIOGRAM COMPLETE  Result Date: 10/09/2020    ECHOCARDIOGRAM REPORT   Patient Name:   [name] Date of Exam: 10/09/2020 Medical Rec #:  [id number]      Height:       68.0 in Accession #:    [id number]     Weight:       167.5 lb Date of Birth:  [birth year]       BSA:          1.896 m Patient Age:     79 years       BP:           92/42 mmHg Patient Gender: F              HR:           88 bpm. Exam Location:  Inpatient Procedure: Cardiac Doppler, Color Doppler, 2D Echo and 3D Echo STAT ECHO Indications:    R07.9* Chest  pain, unspecified  History:        Patient has prior history of Echocardiogram examinations, most                 recent 10/05/2019. CAD, Abnormal ECG and Prior CABG, COPD,                 Signs/Symptoms:Dyspnea; Risk Factors:Sleep Apnea and                 Dyslipidemia. Hypoxia. Pulmonary embolus.  Sonographer:    Roseanna Rainbow RDCS Referring Phys: [id number] [place]  1. Diastology suggestive of impaired LV filling. Left ventricular ejection fraction, by estimation, is 60 to 65%. The left ventricle has normal function. The left ventricle has no regional wall motion abnormalities. There is moderate concentric left ventricular hypertrophy. Left ventricular diastolic parameters are indeterminate.  2. Right ventricular systolic function is low normal. The right ventricular size is normal. There is normal pulmonary artery systolic pressure.  3. Left atrial size was moderately dilated.  4. Right atrial size was moderately dilated.  5. The mitral valve is myxomatous. No evidence of mitral valve regurgitation. The mean mitral valve gradient is 5.0 mmHg with average heart rate of 87 bpm. Moderate mitral annular calcification.  6. The aortic valve is tricuspid. Aortic valve regurgitation is not visualized.  7. The inferior vena cava is dilated in size with <50% respiratory variability, suggesting right atrial pressure of 15 mmHg. Comparison(s): A prior study was performed on 10/05/2019. No significant change from prior study. Subtle decrease in RV function. FINDINGS  Left Ventricle: Diastology suggestive of impaired LV filling. Left ventricular ejection fraction, by estimation, is 60 to 65%. The left ventricle has normal function. The left ventricle has no regional wall motion  abnormalities. The left ventricular internal cavity size was normal in size. There is moderate concentric left ventricular hypertrophy. Left ventricular diastolic parameters are indeterminate. Right Ventricle: The right ventricular size is normal. Right vetricular wall thickness was not well visualized. Right ventricular systolic function is low normal. There is normal pulmonary artery systolic pressure. The tricuspid regurgitant velocity is 2.30 m/s, and with an assumed right atrial pressure of 3 mmHg, the estimated right ventricular systolic pressure is 98.3 mmHg. Left Atrium: Left atrial size was moderately dilated. Right Atrium: Right atrial size was moderately dilated. Pericardium: There is no evidence of pericardial effusion. Mitral Valve: The mitral valve is myxomatous. There is mild calcification of the mitral valve leaflet(s). Moderate mitral annular calcification. No evidence of mitral valve regurgitation. MV peak gradient, 25.4 mmHg. The mean mitral valve gradient is 5.0  mmHg with average heart rate of 87 bpm. Tricuspid Valve: The tricuspid valve is grossly normal. Tricuspid valve regurgitation is trivial. Aortic Valve: The aortic valve is tricuspid. Aortic valve regurgitation is not visualized. Pulmonic Valve: The pulmonic valve was not well visualized. Pulmonic valve regurgitation is trivial. Aorta: The aortic root and ascending aorta are structurally normal, with no evidence of dilitation. Venous: The inferior vena cava is dilated in size with less than 50% respiratory variability, suggesting right atrial pressure of 15 mmHg. IAS/Shunts: The interatrial septum appears to be lipomatous. The atrial septum is grossly normal.  LEFT VENTRICLE PLAX 2D LVIDd:         4.60 cm     Diastology LVIDs:         3.00 cm     LV e' medial:    5.87 cm/s LV PW:  1.40 cm     LV E/e' medial:  27.1 LV IVS:        1.40 cm     LV e' lateral:   8.21 cm/s LVOT diam:     1.70 cm     LV E/e' lateral: 19.4 LV SV:          62 LV SV Index:   33 LVOT Area:     2.27 cm  LV Volumes (MOD) LV vol d, MOD A2C: 99.9 ml LV vol d, MOD A4C: 71.4 ml LV vol s, MOD A2C: 35.9 ml LV vol s, MOD A4C: 26.8 ml LV SV MOD A2C:     64.0 ml LV SV MOD A4C:     71.4 ml LV SV MOD BP:      56.6 ml RIGHT VENTRICLE            IVC RV S prime:     8.08 cm/s  IVC diam: 3.00 cm TAPSE (M-mode): 1.4 cm LEFT ATRIUM             Index       RIGHT ATRIUM           Index LA diam:        3.80 cm 2.00 cm/m  RA Area:     17.30 cm LA Vol (A2C):   56.3 ml 29.70 ml/m RA Volume:   44.90 ml  23.69 ml/m LA Vol (A4C):   44.9 ml 23.69 ml/m LA Biplane Vol: 55.6 ml 29.33 ml/m  AORTIC VALVE LVOT Vmax:   129.00 cm/s LVOT Vmean:  86.900 cm/s LVOT VTI:    0.272 m  AORTA Ao Root diam: 3.40 cm Ao Asc diam:  3.40 cm MITRAL VALVE                TRICUSPID VALVE MV Area (PHT): 4.15 cm     TR Peak grad:   21.2 mmHg MV Peak grad:  25.4 mmHg    TR Vmax:        230.00 cm/s MV Mean grad:  5.0 mmHg MV Vmax:       2.52 m/s     SHUNTS MV Vmean:      88.8 cm/s    Systemic VTI:  0.27 m MV Decel Time: 183 msec     Systemic Diam: 1.70 cm MV E velocity: 159.00 cm/s MV A velocity: 209.00 cm/s MV E/A ratio:  0.76 Rudean Haskell MD Electronically signed by Rudean Haskell MD Signature Date/Time: 10/09/2020/4:38:55 PM    Final    VAS [place] LOWER EXTREMITY VENOUS (DVT)  Result Date: 10/10/2020  Lower Venous DVTStudy Indications: Elevated ddimer.  Performing Technologist: Abram Sander RVS  Examination Guidelines: A complete evaluation includes B-mode imaging, spectral Doppler, color Doppler, and power Doppler as needed of all accessible portions of each vessel. Bilateral testing is considered an integral part of a complete examination. Limited examinations for reoccurring indications may be performed as noted. The reflux portion of the exam is performed with the patient in reverse Trendelenburg.  +---------+---------------+---------+-----------+----------+--------------+ RIGHT     CompressibilityPhasicitySpontaneityPropertiesThrombus Aging +---------+---------------+---------+-----------+----------+--------------+ CFV      Full           Yes      Yes                                 +---------+---------------+---------+-----------+----------+--------------+ SFJ      Full                                                        +---------+---------------+---------+-----------+----------+--------------+  FV Prox  Full                                                        +---------+---------------+---------+-----------+----------+--------------+ FV Mid   Full                                                        +---------+---------------+---------+-----------+----------+--------------+ FV DistalFull                                                        +---------+---------------+---------+-----------+----------+--------------+ PFV      Full                                                        +---------+---------------+---------+-----------+----------+--------------+ POP      None           Yes      Yes                  Chronic        +---------+---------------+---------+-----------+----------+--------------+ PTV      Full                                                        +---------+---------------+---------+-----------+----------+--------------+ PERO     Full                                                        +---------+---------------+---------+-----------+----------+--------------+   +---------+---------------+---------+-----------+----------+--------------+ LEFT     CompressibilityPhasicitySpontaneityPropertiesThrombus Aging +---------+---------------+---------+-----------+----------+--------------+ CFV      Full           Yes      Yes                                 +---------+---------------+---------+-----------+----------+--------------+ SFJ      Full                                                         +---------+---------------+---------+-----------+----------+--------------+ FV Prox  Full                                                        +---------+---------------+---------+-----------+----------+--------------+  FV Mid   Full                                                        +---------+---------------+---------+-----------+----------+--------------+ FV DistalFull                                                        +---------+---------------+---------+-----------+----------+--------------+ PFV      Full                                                        +---------+---------------+---------+-----------+----------+--------------+ POP      Full           Yes      Yes                                 +---------+---------------+---------+-----------+----------+--------------+ PTV      Full                                                        +---------+---------------+---------+-----------+----------+--------------+ PERO     Full                                                        +---------+---------------+---------+-----------+----------+--------------+     Summary: RIGHT: - Findings consistent with chronic deep vein thrombosis involving the right popliteal vein. - No cystic structure found in the popliteal fossa.  LEFT: - There is no evidence of deep vein thrombosis in the lower extremity.  - No cystic structure found in the popliteal fossa.  *See table(s) above for measurements and observations.    Preliminary    VAS [place] UPPER EXTREMITY VENOUS DUPLEX  Result Date: 10/10/2020 UPPER VENOUS STUDY  Indications: elevated ddimer Comparison Study: no prior Performing Technologist: Abram Sander RVS  Examination Guidelines: A complete evaluation includes B-mode imaging, spectral Doppler, color Doppler, and power Doppler as needed of all accessible portions of each vessel. Bilateral testing is considered an integral part of a  complete examination. Limited examinations for reoccurring indications may be performed as noted.  Right Findings: +----------+------------+---------+-----------+----------+-------+ RIGHT     CompressiblePhasicitySpontaneousPropertiesSummary +----------+------------+---------+-----------+----------+-------+ IJV           Full       Yes       Yes                      +----------+------------+---------+-----------+----------+-------+ Subclavian    Full       Yes       Yes                      +----------+------------+---------+-----------+----------+-------+  Axillary      Full       Yes       Yes                      +----------+------------+---------+-----------+----------+-------+ Brachial      Full       Yes       Yes                      +----------+------------+---------+-----------+----------+-------+ Radial        Full                                          +----------+------------+---------+-----------+----------+-------+ Ulnar         Full                                          +----------+------------+---------+-----------+----------+-------+ Cephalic      Full                                          +----------+------------+---------+-----------+----------+-------+ Basilic       Full                                          +----------+------------+---------+-----------+----------+-------+  Left Findings: +----------+------------+---------+-----------+----------+-------+ LEFT      CompressiblePhasicitySpontaneousPropertiesSummary +----------+------------+---------+-----------+----------+-------+ IJV           Full       Yes       Yes                      +----------+------------+---------+-----------+----------+-------+ Subclavian    Full       Yes       Yes                      +----------+------------+---------+-----------+----------+-------+ Axillary      Full       Yes       Yes                       +----------+------------+---------+-----------+----------+-------+ Brachial      Full       Yes       Yes                      +----------+------------+---------+-----------+----------+-------+ Radial        Full                                          +----------+------------+---------+-----------+----------+-------+ Ulnar         Full                                          +----------+------------+---------+-----------+----------+-------+ Cephalic      Full                                          +----------+------------+---------+-----------+----------+-------+  Basilic       Full                                          +----------+------------+---------+-----------+----------+-------+  Summary: No evidence of deep vein or superficial vein thrombosis involving the right and left upper extremities.  *See table(s) above for measurements and observations.    Preliminary     Cardiac Studies   Echocardiogram 10/09/2020: Impressions: 1. Diastology suggestive of impaired LV filling. Left ventricular  ejection fraction, by estimation, is 60 to 65%. The left ventricle has  normal function. The left ventricle has no regional wall motion  abnormalities. There is moderate concentric left  ventricular hypertrophy. Left ventricular diastolic parameters are  indeterminate.  2. Right ventricular systolic function is low normal. The right  ventricular size is normal. There is normal pulmonary artery systolic  pressure.  3. Left atrial size was moderately dilated.  4. Right atrial size was moderately dilated.  5. The mitral valve is myxomatous. No evidence of mitral valve  regurgitation. The mean mitral valve gradient is 5.0 mmHg with average  heart rate of 87 bpm. Moderate mitral annular calcification.  6. The aortic valve is tricuspid. Aortic valve regurgitation is not  visualized.  7. The inferior vena cava is dilated in size with <50% respiratory    variability, suggesting right atrial pressure of 15 mmHg.   Comparison(s): A prior study was performed on 10/05/2019. No significant  change from prior study. Subtle decrease in RV function.    Patient Profile     79 y.o. female with a history of CAD s/p CABG in 2004 with subsequent DES to left main in 5102, chronic diastolic CHF, carotid artery disease, PAD s/p SMA stenting in 2019, recurrent DVT/PE s/p IVC filter in3/2019not on anticoagulation due to GI bleed and prior large abdominal wall hematoma, COPD on home O2, cirrhosis, hypertension, hyperlipidemia, hyperthyroidism, chronic low back pain, and depression who is being seen today for the evaluation of CHF and chest pain at the request of Dr. Neysa Bonito.  Assessment & Plan    Acute on Chronic Hypoxic Respiratory Failure  Community Acquired Pneumonia COPD on 4L of O2 at Home - Patient presented with worsening shortness of breath, productive cough, and fever at home. O2 sats noted to be in the 70's on room air at urgent care.  - Chest x-ray showed suspicious for bronchitis and mid and lower lobe pneumonia.  - D-dimer elevated at 8.76. Unable to get chest CTA at this time due to AKI. Creatinine continues to rise so V/Q scan has been ordered. - BNP elevated at 491.  - Started on antibiotics for CAP per primary team.  - Started on IV Heparin for suspected PE. - May be some diastolic CHF component. See below. - Management per primary team.  Possible Acute on Chronic Diastolic CHF - BNP elevated at 491.  - No edema on repeat chest x-ray today. - Echo showed LVEF of 60-65% with moderate LVH and impaired LV filling pressure. May be some subtle decrease in RV function. Mitral valve myxomatous but no MR noted.  -  Slightly volume overloaded but no grossly.  - Given soft BP and rising creatinine, will hold off diuresis for now (do not think CHF is the driving factor here). - Continue to monitor volume status closely.  Chest Pain CAD s/p  CABG in 2004 with Subsequent DES to  Left main in 2010 Demand Ischemia - Patient continues to describe diffuse chest/back pain this morning that she describes as a "dull tooth ache." 10/10 this morning and improved to 8/10 with IV Morphine.  - Repeat EKG shows some T wave inversion in aVL and mild ST depression in I.  - High-sensitivity troponin mildly elevated and flat at 36 >> 41 (felt to be demand ischemia). Repeat troponin this morning pending. - Still do not think this is ACS in nature. May be musculoskeletal component. Also may be secondary to underlying pulmonary issues.  -  Will start Aspirin 81mg  daily. Continue statin.   Recurrent PE/DVT s/p IVC Filter - History of recurrent PE/DVT/ S/p IVC filter in 02/2018. Not on anticoagulation due to GI bleed and prior large abdominal wall hematoma. - D-dimer elevated as above.  - Possible that patient has another PE. Unfortunately, cannot perform CTA at this time due to renal function. V/Q scan has been ordered.  - Upper and lower extremity dopplers ordered to rule out DVT. Preliminary reads shows chronic DVT in right popliteal vein and negative for DVT in the upper extremities.  Hypotension - History of hypertension but has been hypotensive here. Started on Phenylephrine last night for persistent hypotension. - Given EF is normal, do not think this is a cardiogenic shock picture. May be more septic with underlying pneumonia. - Will defer pressors to PCCM.  Acute on Chronic Renal Function - Creatinine 1.97 on admission and 2.19 today. - Suspect prerenal with hypotension. - Continue to monitor closely.  Otherwise, per primary team.  For questions or updates, please contact [place] Please consult [email or web address] for contact info under        Signed, Darreld Mclean, PA-C  10/10/2020, 11:18 AM    Patient seen and examined and agree with Sande Rives, PA.   In brief, Ms Gehlhausen is a 79 y.o. female with a history of CAD s/p  CABG in 2004 with subsequent DES to left main in 2694, chronic diastolic CHF, carotid artery disease, PAD s/p SMA stenting in 2019, recurrent DVT/PE s/p IVC filter in3/2019not on anticoagulation due to GI bleed and prior large abdominal wall hematoma, COPD on home O2, cirrhosis, hypertension, hyperlipidemia, hyperthyroidism, and depression who presented with respiratory distress, fevers, and hypotension with concern for pneumonia, COPD exacerbation, and volume overload.  Overnight, required admission to ICU for pressor support due to hypotension. Initiated on ABX and steroids in addition to nebs for PNA and COPD exacerbation. Looks much better this morning with improved symptoms. Had episode of chest pain for which repeat ECG was performed and demonstrated sinus tachycardia without ischemia. Trop flat at 47. Pain reproduced by pressing on her chest with low suspicion of ACS.  Suspect patient's SOB is multifactorial in the setting of COPD, pneumonia and volume overload. While I think the primary drivers are the former, she is mildly hypervolemic and will require diuresis soon. Holding for now given hypotension and rising Cr. D-dimer also elevated and currently awaiting V/Q scan.  Exam: General: Sitting up, much more comfortable this afternoon Resp: Mild expiratory wheezing with rhonchi; much improved from prior CV: Tachycardic, regular, no murmurs. +JVD Abd: Soft, ND, NTTP Extremities: Warm with trace edema on the shins Neuro: AAOx3, grossly nonfocal  Plan: -Continue management of COPD and pneumonia per primary team -She is overloaded on exam, but holding diuresis for now given need for pressor support-->will need in the future although pulmonary edema is not likely to be the main driver of her  SOB -Follow-up V/Q scan -Continue heparin gtt for now -Continue phenylephrine for pressor support with goal MAP>60 -Low suspicion for cardiac etiology of chest pain this morning. Trop flat, ecg without  ischemia, pain reproduced with coughing or pressing on her chest.   Gwyndolyn Kaufman, MD

## 2020-10-10 NOTE — Progress Notes (Signed)
Upper and lower extremity venous duplex has been completed.   Preliminary results in CV Proc.   Abram Sander 10/10/2020 9:17 AM

## 2020-10-10 NOTE — Progress Notes (Signed)
ANTICOAGULATION CONSULT NOTE -   Pharmacy Consult for IV heparin Indication: presumed PE  Allergies  Allergen Reactions   Ativan [Lorazepam] Other (See Comments)    Confusion    Pitavastatin Other (See Comments)    LIVALO: reaction not recalled   Ropinirole Nausea Only and Other (See Comments)    Makes legs jump, also   Zofran [Ondansetron Hcl] Nausea Only   Zolpidem Tartrate Anxiety and Other (See Comments)    CONFUSION    Penicillins Rash and Other (See Comments)    Tolerated cephalosporins in past Has patient had a PCN reaction causing immediate rash, facial/tongue/throat swelling, SOB or lightheadedness with hypotension: Yes Has patient had a PCN reaction causing severe rash involving mucus membranes or skin necrosis: No Has patient had a PCN reaction that required hospitalization: No Has patient had a PCN reaction occurring within the last 10 years: No If all of the above answers are "NO", then may proceed with Cephalosporin use.     Patient Measurements: Height: 5\' 8"  (172.7 cm) Weight: 75.3 kg (166 lb 0.1 oz) IBW/kg (Calculated) : 63.9 Heparin Dosing Weight: 76 kg  Vital Signs: Temp: 98.3 F (36.8 C) (10/20 0307) Temp Source: Oral (10/20 0307) BP: 112/51 (10/20 0900) Pulse Rate: 112 (10/20 0900)  Labs: Recent Labs    10/09/20 1141 10/09/20 1355 10/09/20 2348 10/10/20 0245 10/10/20 0829  HGB 10.6*  --   --  9.2*  --   HCT 33.5*  --   --  29.0*  --   PLT 280  --   --  249  --   HEPARINUNFRC  --   --  <0.10*  --  0.23*  CREATININE 1.97*  --   --  2.19*  --   TROPONINIHS 36* 41*  --   --   --     Estimated Creatinine Clearance: 20.7 mL/min (A) (by C-G formula based on SCr of 2.19 mg/dL (H)).  Assessment: 79 yo female presented with shortness of breath to start IV heparin for presumed PE. Will need to follow up CT scan or VQ scan results to confirm PE. Baseline CBC and SCr drawn. Patient with listed hx of DVTs of PMH but not currently on any  anticoagulants. S/p IVC filter placement PTA.  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

## 2020-10-10 NOTE — Progress Notes (Signed)
Patient complaining of chest pain described as constant and heavy. Dr. Florene Glen on unit made aware and new orders received

## 2020-10-10 NOTE — Consult Note (Signed)
   [facility] CM Inpatient Consult   10/10/2020  [name] December 16, 1941 [id number]  Patient is currently active with [facility] for chronic disease management services.  Patient has been engaged by a [facility] RN CM.   Will continue to follow for progression and disposition plans and update [facility] CM RN of patient progress. Of note, [facility] Care Management services does not replace or interfere with any services that are arranged by inpatient case management or social work.  Judy Cedars, MSN, [facility] Liaison Nurse Mobile Phone [phone]  Toll free office [phone]

## 2020-10-10 NOTE — Progress Notes (Signed)
Consulted for PIV for neo infusion.  Recommended central access.   Barnetta Chapel RN to discuss with MD for central access for multiple medication needs.

## 2020-10-10 NOTE — Consult Note (Addendum)
NAME:  [name], MRN:  [id number], DOB:  01-Feb-1941, LOS: 1 ADMISSION DATE:  10/09/2020, CONSULTATION DATE:  target organ damage REFERRING MD: Elodia Florence., * CHIEF COMPLAINT: Cough, shortness of breath  Brief History   This is an 79 year old woman with past history of CAD status post CABG, congestive heart failure with admissions in the past, severe COPD due to former tobacco abuse, chronic hypoxemic respiratory failure requiring 4 L nasal cannula, history of DVT who could not tolerate anticoagulation status post IVC filter still in place baseline who presented to the ED from her doctor's office with a couple days of increased cough, congestion, shortness of breath, and mild fever number seen in consultation at the request of Madelin Headings, MD for evaluation of acute on chronic hypoxemic respiratory failure.  History of present illness   Patient is felt a bit ill the last couple of days.  Notes increased cough, chest congestion.  Notes borderline fevers at home as high as the 100.8 Fahrenheit.  Coughing up more sputum than normal.  Increased shortness of breath including worsening wheeze.  Shortness of breath with exertion.  Notably, more short of breath when lying flat as well.  Last couple days has had poor appetite but has been drinking a lot of water per her and son at bedside.  She went to urgent care recently with negative Covid testing was given some antibiotics. she still did not feel well so went to her doctor's office this morning.  Her portable tank ran out of oxygen.  The doctor's office had no oxygen.  EMS was called as her sats dropped.  Reportedly 60s on room air.  Came to the ED after nebulizer treatment up to the 90s.  Was placed on 4 L nasal cannula, 97% at time of my evaluation.  Lab work notable for increased creatinine 1.9 from baseline 1.3-1.5, elevated proBNP 491.4 which is the highest value in our system and double values when she was admitted for congestive heart  failure exacerbation in 2020.  Her troponin was mildly elevated.  Chest x-ray notable for hazy bilateral interstitial prominence most notable centrally but also peripherally when compared to most recent film in 2020 my interpretation.  Her lactic acid is within normal limits.  Blood pressures have been borderline.  A D-dimer was obtained which was grossly positive.  Unable to obtain CTA PE protocol due to AKI.  Echocardiogram was obtained which demonstrated impaired LV filling, normal EF, low normal RV function, mildly dilated LA, moderately dilated RA, elevated right atrial pressure 15 mmHg.  Past Medical History  CAD, CKD, COPD, chronic hypoxic respiratory failure, DVT/PE  Significant Hospital Events   Admitted 10/19  Consults:  Cardiology, VIR, pulmonary  Procedures:  N/A  Significant Diagnostic Tests:  Elevated D-dimer, proBNP elevated   Micro Data:  Blood cultures pending  Antimicrobials:  Ceftriaxone, doxycycline, azithromycin  Interim history/subjective:  Breathing subjectively improved, stable on home O2. Bps low, peripheral phenyl started overnight. Cr up, no UOP recorded.  Objective   Blood pressure (!) 112/51, pulse (!) 112, temperature 98.9 F (37.2 C), temperature source Axillary, resp. rate 19, height 5\' 8"  (1.727 m), weight 75.3 kg, SpO2 91 %.        Intake/Output Summary (Last 24 hours) at 10/10/2020 0941 Last data filed at 10/10/2020 0600 Gross per 24 hour  Intake 2585.21 ml  Output 0 ml  Net 2585.21 ml   Filed Weights   10/09/20 1123 10/10/20 0219  Weight: 76 kg  75.3 kg    Examination: General: Chronically ill-appearing, in no acute distress Eyes: EOMI, no icterus Neck: JVP 4 cm above clavicle with head of bed at 60 degrees, estimated central venous pressure mid to low teens, supple Respiratory: Inspiratory wheeze much more prominent on the left, inspiratory rhonchi on the right, normal work of breathing on 4 L nasal cannula Cardiovascular:  tachycardic and rhythm, no murmurs Abdomen: Soft, nondistended, bowel sounds present Extremities: Warm, 1+ pitting edema to midshin bilaterally, Neuro: No weakness, sensation intact Psych: Normal mood, full affect  Resolved Hospital Problem list   N/A  Assessment & Plan:  Chronic hypoxemic respiratory failure: Currently stable on chronic her home 4 L nasal cannula with sats in high 90s.  Likely combination of severe COPD as well as element of chronic volume overload. --See below for further recommendations  Septic shock due to community-acquired pneumonia: Fever, leukocytosis, chest x-ray infiltrates. Needing low dose pressor, Renal dysfunction present. --Continue Coverage with doxycycline and ceftriaxone --MAP > 65  Acute Renal Failure: ATN due to hypotension, septic shock. UOP near 0 since admission. Cr elevated above baseline. Has received 2.5 L resuscitation. Signs of chronic volume overload on exam and RA pressure estimated 15 on TTE. --Bladder scan. --pressors to keep MAP > 65, hope this improves renal perfusion and yields improved UOP --Stable on home O2, cautious IVF and albumin bolus  COPD with acute exacerbation: Increase sputum, congestion, cough with wheeze on exam.  Chest x-ray with hazy infiltrates suspicious for infectious etiology.  Covid negative.  Clinically, suspect this is the largest contributor to her presentation. --Continue antibiotics --Prednisone 40 mg daily ordered to start tonight --Bronchodilator therapies via nebulizer ordered  Volume overload: She has been edema on shins as well as elevated JVP on exam and estimated elevated RA pressure.  She endorses orthopnea.  Do feel there is an element of volume overload although this may be more chronic in nature.  Given borderline blood pressures at this time, do not feel she warrants diuresis especially in the setting of home oxygen saturation.  Do think eventually this hospitalization she very well may need increased  Lasix support. --Continue to monitor, consider Lasix in the future once blood pressure stabilizes  Elevated D-dimer: Concern for PE given history of same, DVT.  Do feel with her chest x-ray infiltrates and cough, congestion and mild volume overload, we have other etiologies for her presentation.  However given elevated D-dimer do feel obligated to evaluate for PE.  Notably, presuming it is in the correct position, her IVC filter should protect her from lower extremity DVTs causing PE.  Upper extremity DVTs: Causing PE are less common but possible. --Agree with VQider venous upper and lower extremity Dopplers to evaluate for presence of DVT which would warrant anticoagulation --Continue therapeutic heparin for now  Labs   CBC: Recent Labs  Lab 10/09/20 1141 10/10/20 0245  WBC 18.0* 16.4*  HGB 10.6* 9.2*  HCT 33.5* 29.0*  MCV 93.8 94.2  PLT 280 423    Basic Metabolic Panel: Recent Labs  Lab 10/09/20 1141 10/10/20 0245  NA 138 133*  K 4.1 3.8  CL 97* 97*  CO2 28 24  GLUCOSE 117* 197*  BUN 46* 52*  CREATININE 1.97* 2.19*  CALCIUM 9.2 8.2*   GFR: Estimated Creatinine Clearance: 20.7 mL/min (A) (by C-G formula based on SCr of 2.19 mg/dL (H)). Recent Labs  Lab 10/09/20 1141 10/09/20 1238 10/09/20 1355 10/10/20 0245  PROCALCITON  --   --  0.61  --  WBC 18.0*  --   --  16.4*  LATICACIDVEN  --  1.2  --   --     Liver Function Tests: No results for input(s): AST, ALT, ALKPHOS, BILITOT, PROT, ALBUMIN in the last 168 hours. No results for input(s): LIPASE, AMYLASE in the last 168 hours. No results for input(s): AMMONIA in the last 168 hours.  ABG    Component Value Date/Time   PHART 7.374 03/14/2018 0127   PCO2ART 29.2 (L) 03/14/2018 0127   PO2ART 139 (H) 03/14/2018 0127   HCO3 16.7 (L) 03/14/2018 0127   TCO2 30 10/29/2011 0925   ACIDBASEDEF 7.6 (H) 03/14/2018 0127   O2SAT 98.8 03/14/2018 0127     Coagulation Profile: No results for input(s): INR, PROTIME in the  last 168 hours.  Cardiac Enzymes: No results for input(s): CKTOTAL, CKMB, CKMBINDEX, TROPONINI in the last 168 hours.  HbA1C: No results found for: HGBA1C  CBG: No results for input(s): GLUCAP in the last 168 hours.  Review of Systems:   Positive for orthopnea, positive for worsening lower extremity swelling of the feet, no chest pain.  Comprehensive review systems otherwise negative.  Past Medical History  She,  has a past medical history of Anemia, Anxiety, Arthralgia, Blood transfusion (2012; 02/2018), CAD (coronary artery disease), Carotid artery disease ([place]), CHF (congestive heart failure) ([place]) (01/21/2020), Chronic diastolic heart failure ([place]), Chronic lower back pain, COPD (chronic obstructive pulmonary disease) ([place]), Depression, Diuretic-induced hypokalemia (10/08/2018), DVT of lower extremity (deep venous thrombosis) ([place]), Dyspnea, Dysrhythmia, Exertional angina ([place]), GERD (gastroesophageal reflux disease), Gout, HLD (hyperlipidemia), Hypertension, Hyperthyroidism, L Subclavian Artery Stenosis, Obesity (BMI 30-39.9) (2009), Osteoarthritis, Oxygen deficiency, Oxygen dependent, Pneumonia, PVD (peripheral vascular disease) ([place]), and Sleep apnea (2012).   Surgical History    Past Surgical History:  Procedure Laterality Date  . ANKLE SURGERY  2004  . ANTERIOR CERVICAL DECOMP/DISCECTOMY FUSION  11/2007  . AUGMENTATION MAMMAPLASTY    . [place]   lower; another scheduled, opt. reports 4 back- lumbar, 3 cerv. fusions  for later 2009  . CARDIAC CATHETERIZATION  11/2009   Patent LIMA to LAD and patent SVG to OM1. Occluded SVG to ramus and diagonal. Left main: 90% ostial stenosis, LCX 60-70% proximal stenosis  . CATARACT EXTRACTION W/ INTRAOCULAR LENS  IMPLANT, BILATERAL Bilateral 2006  . COLONOSCOPY WITH PROPOFOL N/A 02/17/2017   Procedure: COLONOSCOPY WITH PROPOFOL;  Surgeon: Jerene Bears, MD;  Location[facility] ENDOSCOPY;  Service: Endoscopy;  Laterality: N/A;  . CORONARY  ANGIOPLASTY WITH STENT PLACEMENT  11/2009   Drug eluting stent to left main artery: 4.0 X 12 mm Ion   . CORONARY ARTERY BYPASS GRAFT  2004   "CABG X4"  . ESOPHAGOGASTRODUODENOSCOPY N/A 02/14/2017   Procedure: ESOPHAGOGASTRODUODENOSCOPY (EGD);  Surgeon: Doran Stabler, MD;  Location: [facility] ENDOSCOPY;  Service: Endoscopy;  Laterality: N/A;  . GIVENS CAPSULE STUDY N/A 02/15/2017   Procedure: GIVENS CAPSULE STUDY;  Surgeon: Doran Stabler, MD;  Location: [place];  Service: Endoscopy;  Laterality: N/A;  . GROIN MASS OPEN BIOPSY  2004  . IR IVC FILTER PLMT / S&I /IMG GUID/MOD SED  03/14/2018  . IR PARACENTESIS  03/10/2018  . JOINT REPLACEMENT    . LAPAROSCOPIC CHOLECYSTECTOMY    . PERIPHERAL VASCULAR INTERVENTION Left 03/05/2018   Procedure: PERIPHERAL VASCULAR INTERVENTION;  Surgeon: Elam Dutch, MD;  Location: [place] CV LAB;  Service: Cardiovascular;  Laterality: Left;  Attempted unsuccess\ful Per Dr. Eden Lathe  . PERIPHERAL VASCULAR INTERVENTION  03/08/2018   Procedure: PERIPHERAL VASCULAR INTERVENTION;  Surgeon: Waynetta Sandy, MD;  Location: [place] [facility];  Service: Cardiovascular;;  SMA Stent   . REPLACEMENT TOTAL KNEE BILATERAL Bilateral 2012  . REVERSE SHOULDER ARTHROPLASTY  02/26/2012   Procedure: REVERSE SHOULDER ARTHROPLASTY;  Surgeon: Marin Shutter, MD;  Location: [place];  Service: Orthopedics;  Laterality: Right;  RIGHT SHOULDER REVERSED ARTHROPLASTY  . SHOULDER ARTHROSCOPY WITH SUBACROMIAL DECOMPRESSION Left 02/10/2013   Procedure: SHOULDER ARTHROSCOPY WITH SUBACROMIAL DECOMPRESSION DISTAL CLAVICLE RESECTION;  Surgeon: Marin Shutter, MD;  Location: [place];  Service: Orthopedics;  Laterality: Left;  DISTAL CLAVICLE RESECTION  . TONSILLECTOMY    . TOTAL HIP ARTHROPLASTY  2007   Right  . TOTAL HIP ARTHROPLASTY Left 02/07/2020   Procedure: TOTAL HIP ARTHROPLASTY ANTERIOR APPROACH;  Surgeon: Paralee Cancel, MD;  Location: [facility];  Service: Orthopedics;  Laterality:  Left;  70 mins  . TRANSURETHRAL RESECTION OF BLADDER TUMOR N/A 05/31/2018   Procedure: TRANSURETHRAL RESECTION OF BLADDER TUMOR (TURBT);  Surgeon: Lucas Mallow, MD;  Location: [facility];  Service: Urology;  Laterality: N/A;  . VISCERAL ANGIOGRAPHY N/A 03/05/2018   Procedure: VISCERAL ANGIOGRAPHY;  Surgeon: Elam Dutch, MD;  Location: [place] [facility];  Service: Cardiovascular;  Laterality: N/A;  . VISCERAL ANGIOGRAPHY N/A 03/08/2018   Procedure: VISCERAL ANGIOGRAPHY;  Surgeon: Waynetta Sandy, MD;  Location: [place] [facility];  Service: Cardiovascular;  Laterality: N/A;     Social History   reports that she quit smoking about 19 years ago. Her smoking use included cigarettes. She has a 45.00 pack-year smoking history. She has never used smokeless tobacco. She reports that she does not drink alcohol and does not use drugs.   Family History   Her family history includes COPD in her father; Colitis in her sister; Heart disease in her father and sister; Lung cancer in her father; Ovarian cancer in her mother; Stomach cancer in her sister.   Allergies Allergies  Allergen Reactions  . Ativan [Lorazepam] Other (See Comments)    Confusion   . Pitavastatin Other (See Comments)    LIVALO: reaction not recalled  . Ropinirole Nausea Only and Other (See Comments)    Makes legs jump, also  . Zofran [Ondansetron Hcl] Nausea Only  . Zolpidem Tartrate Anxiety and Other (See Comments)    CONFUSION   . Penicillins Rash and Other (See Comments)    Tolerated cephalosporins in past Has patient had a PCN reaction causing immediate rash, facial/tongue/throat swelling, SOB or lightheadedness with hypotension: Yes Has patient had a PCN reaction causing severe rash involving mucus membranes or skin necrosis: No Has patient had a PCN reaction that required hospitalization: No Has patient had a PCN reaction occurring within the last 10 years: No If all of the above answers are "NO", then may  proceed with Cephalosporin use.      Home Medications  Prior to Admission medications   Medication Sig Start Date End Date Taking? Authorizing Provider  allopurinol (ZYLOPRIM) 100 MG tablet Take 100 mg by mouth daily. 01/30/17  Yes [provider]  ARIPiprazole (ABILIFY) 2 MG tablet Take 2 mg by mouth daily. 12/29/19  Yes [provider]  ascorbic acid (VITAMIN C) 500 MG tablet Take 500 mg by mouth daily.   Yes [provider]  Aspirin-Salicylamide-Caffeine (BC HEADACHE POWDER PO) Take 1 packet by mouth as needed (pain).   Yes [provider]  atorvastatin (LIPITOR) 20 MG tablet Take 20  mg by mouth daily. 09/21/20  Yes [provider]  citalopram (CELEXA) 40 MG tablet Take 40 mg by mouth at bedtime. 08/30/19  Yes [provider]  cyclobenzaprine (FLEXERIL) 10 MG tablet Take 10 mg by mouth 3 (three) times daily as needed for muscle spasms.    Yes [provider]  ferrous sulfate (FERROUSUL) 325 (65 FE) MG tablet Take 1 tablet (325 mg total) by mouth 3 (three) times daily with meals for 14 days. Patient taking differently: Take 325 mg by mouth daily.  02/08/20 10/09/20 Yes Babish, Rodman Key, PA-C  furosemide (LASIX) 40 MG tablet Take two tablets by mouth every Monday and Thursday afternoon.  Take one tablet all other afternoons. Patient taking differently: Take 40-80 mg by mouth See admin instructions. Take 80mg  every Monday and Thursday afternoon, and 40mg  on all other afternoons. 03/13/20  Yes Fay Records, MD  gabapentin (NEURONTIN) 100 MG capsule Take 100 mg by mouth daily. 01/09/20  Yes [provider]  guaiFENesin (MUCINEX) 600 MG 12 hr tablet Take 600 mg by mouth 2 (two) times daily.    Yes [provider]  INCRUSE ELLIPTA 62.5 MCG/INH AEPB Inhale 1 puff into the lungs 2 (two) times daily.  11/30/16  Yes [provider]  Ipratropium-Albuterol (COMBIVENT) 20-100 MCG/ACT AERS respimat Inhale 1 puff into the lungs  every 6 (six) hours as needed for wheezing or shortness of breath (or to help expel mucous).    Yes [provider]  levothyroxine (SYNTHROID) 50 MCG tablet Take 1 tablet (50 mcg total) by mouth daily at 6 (six) AM. 09/13/20  Yes Fay Records, MD  lisinopril (ZESTRIL) 2.5 MG tablet Take 2.5 mg by mouth daily. 09/25/20  Yes [provider]  magnesium oxide (MAG-OX) 400 (241.3 Mg) MG tablet Take 1 tablet (400 mg total) by mouth 2 (two) times daily. 03/26/18  Yes Mikhail, Velta Addison, DO  Melatonin 10 MG TABS Take 10 mg by mouth at bedtime.    Yes [provider]  metolazone (ZAROXOLYN) 2.5 MG tablet Take 2.5 mg by mouth at bedtime.  08/22/20  Yes [provider]  neomycin-polymyxin-hydrocortisone (CORTISPORIN) 3.5-10000-1 OTIC suspension Place 5 drops into the right ear daily as needed (ear pain).  08/31/19  Yes [provider]  nitroGLYCERIN (NITROSTAT) 0.4 MG SL tablet Place 1 tablet (0.4 mg total) under the tongue every 5 (five) minutes as needed for chest pain. For chest pain 04/24/16  Yes Fay Records, MD  ofloxacin (FLOXIN) 0.3 % OTIC solution Place 2 drops into the left ear daily.  04/13/20  Yes [provider]  omeprazole (PRILOSEC) 40 MG capsule Take 1 capsule (40 mg total) by mouth 2 (two) times daily. Patient taking differently: Take 40 mg by mouth every evening.  12/19/19  Yes Esterwood, Amy S, PA-C  OXYGEN Inhale 4 L/min into the lungs as needed (DURING ALL TIMES OF EXERTION).    Yes [provider]  pilocarpine (PILOCAR) 1 % ophthalmic solution Place 1 drop into both eyes 2 (two) times daily.   Yes [provider]  polyethylene glycol (MIRALAX / GLYCOLAX) 17 g packet Take 17 g by mouth 2 (two) times daily. Patient taking differently: Take 17 g by mouth daily.  02/08/20  Yes Babish, Rodman Key, PA-C  potassium chloride SA (KLOR-CON) 20 MEQ tablet Take 20 mEq by mouth daily. 10/01/20  Yes [provider]  pramipexole (MIRAPEX)  0.25 MG tablet Take 0.25 mg by mouth every evening.  08/22/20  Yes  [provider]  roflumilast (DALIRESP) 500 MCG TABS tablet Take 500 mcg by mouth every evening.    Yes [provider]  SPIRIVA RESPIMAT 1.25 MCG/ACT AERS Inhale 2 puffs into the lungs daily.  08/24/20  Yes [provider]  spironolactone (ALDACTONE) 25 MG tablet TAKE 1 TABLET BY MOUTH DAILY Patient taking differently: Take 25 mg by mouth daily.  11/25/19  Yes Fay Records, MD  traZODone (DESYREL) 50 MG tablet Take 150 mg by mouth at bedtime.  10/07/18  Yes [provider]  XTAMPZA ER 9 MG C12A Take 9 mg by mouth every 8 (eight) hours. 09/25/20  Yes [provider]  acetaminophen (TYLENOL) 500 MG tablet Take 2 tablets (1,000 mg total) by mouth every 8 (eight) hours. Patient not taking: Reported on 10/09/2020 02/08/20   Danae Orleans, PA-C  docusate sodium (COLACE) 100 MG capsule Take 1 capsule (100 mg total) by mouth 2 (two) times daily. Patient not taking: Reported on 10/09/2020 02/08/20   Danae Orleans, PA-C  oxyCODONE 10 MG TABS Take 1-2 tablets (10-20 mg total) by mouth every 4 (four) hours as needed for moderate pain or severe pain. Patient not taking: Reported on 10/09/2020 02/08/20   Danae Orleans, PA-C  potassium chloride (KLOR-CON) 10 MEQ tablet Take 2 tablets (20 mEq total) by mouth daily. Take 2 additional tablets every Monday and Thursday. Patient not taking: Reported on 10/09/2020 03/13/20   Fay Records, MD  rivaroxaban (XARELTO) 10 MG TABS tablet Take 1 tablet (10 mg total) by mouth daily for 14 days. Patient not taking: Reported on 10/09/2020 02/09/20 10/09/20  Danae Orleans, PA-C     Critical care time:      CRITICAL CARE Performed by: Lanier Clam   Total critical care time: 40 minutes  Critical care time was exclusive of separately billable procedures and treating other patients.  Critical care was necessary to treat or prevent imminent or  life-threatening deterioration.  Critical care was time spent personally by me on the following activities: development of treatment plan with patient and/or surrogate as well as nursing, discussions with consultants, evaluation of patient's response to treatment, examination of patient, obtaining history from patient or surrogate, ordering and performing treatments and interventions, ordering and review of laboratory studies, ordering and review of radiographic studies, pulse oximetry and re-evaluation of patient's condition.

## 2020-10-10 NOTE — TOC Initial Note (Signed)
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  Patient was seen in the urgent care 1 week ago for her cough and shortness of breath and CXR showed infiltrates and she was started on antibiotics.  She returned to the UC with worsening cough and shortness of breath and fever 100.74F at home.  She was having trouble with her O2 tank at home and was out of O2 and so she was noted to be 72% on room air at [facility].  Covid test was negative on both occasions.   States that she typically uses her O2 intermittently but has been requiring constant supplemental O2 over the past week.  Also has had some chest pain and has been taking so much nitroglycerin that she has run out of it in the past 24 hours.  Also admits to orthopnea and lower extremity edema.  ED Course: Afebrile, tachycardic, tachypneic, hypotensive (SBP 80s and 90s despite IV fluids) requiring 4 L/min.  Notable labs: Glucose 117, BUN 46, creatinine 1.97 (previously 1.19 on 09/10/2020), troponin 36, WBC 18.0, Hb 10.6, COVID-19 negative.  CXR with suspicion of bronchitis and hazy pneumonia in the mid and lower lungs with no dense consolidation or lobar collapse. Iv kvo, iv rocephin, iv heparin, iv neo started this am due to  hypotension bp-108/45, na 133, bun-52 creat.-2.19, wbc 16.4/ Following for pprogression and toc needs,plan is to return to home with slef care, lives [place],  Has a son in the area.  Expected Discharge Plan: Home/Self Care Barriers to Discharge: Barriers Unresolved (comment)   Patient Goals and CMS Choice Patient states their goals for this hospitalization and ongoing recovery are:: to go home CMS Medicare.gov Compare Post Acute Care list provided to:: Patient    Expected Discharge Plan and Services Expected Discharge Plan: Home/Self Care   Discharge Planning Services: CM Consult   Living arrangements for the past 2 months: Single Family Home                                      Prior Living Arrangements/Services Living arrangements for the past 2 months: Single Family Home Lives with:: Self Patient language and need for interpreter reviewed:: No Do you feel safe going back to the place where you live?: Yes      Need for Family Participation in Patient Care: Yes (Comment) Care giver support system in place?: Yes (comment)   Criminal Activity/Legal Involvement Pertinent to Current Situation/Hospitalization: No - Comment as needed  Activities of Daily Living Home Assistive Devices/Equipment: Eyeglasses, Dentures (specify type), Cane (specify quad or straight), Walker (specify type) (full upper, full lower plate, electric scooter) ADL Screening (condition at  time of admission) Patient's cognitive ability adequate to safely complete daily activities?: Yes Is the patient deaf or have difficulty hearing?: No Does the patient have difficulty seeing, even when wearing glasses/contacts?: No Does the patient have difficulty concentrating, remembering, or making decisions?: Yes Patient able to express need for assistance with ADLs?: Yes Does the patient have difficulty dressing or bathing?: No Independently performs ADLs?: Yes (appropriate for developmental age) Does the  patient have difficulty walking or climbing stairs?: Yes Weakness of Legs: Both Weakness of Arms/Hands: Both  Permission Sought/Granted                  Emotional Assessment Appearance:: Appears stated age Attitude/Demeanor/Rapport: Engaged Affect (typically observed): Calm Orientation: : Oriented to Place, Oriented to Self, Oriented to  Time, Oriented to Situation Alcohol / Substance Use: Not Applicable Psych Involvement: No (comment)  Admission diagnosis:  Acute respiratory failure with hypoxia ([place]) [J96.01] Community acquired pneumonia of right lower lobe of lung [J18.9] Sepsis without acute organ dysfunction, due to unspecified organism [facility]) [A41.9] Patient Active Problem List   Diagnosis Date Noted  . Acute respiratory failure with hypoxia ([place]) 10/09/2020  . Hypotension 10/09/2020  . CAP (community acquired pneumonia) 10/09/2020  . L Subclavian Artery Stenosis   . S/P hip replacement, left 02/07/2020  . History of pulmonary embolism 10/04/2019  . Dyspnea 12/24/2018  . Cellulitis 12/24/2018  . Otitis media 12/24/2018  . Diuretic-induced hypokalemia 10/08/2018  . Acute on chronic diastolic heart failure ([place]) 10/06/2018  . Hematuria 04/01/2018  . Acute lower UTI 04/01/2018  . Hematoma of abdominal wall, subsequent encounter 04/01/2018  . Chronic diastolic heart failure ([place]) 03/29/2018  . GERD without esophagitis 03/29/2018  . Chronic gout 03/29/2018  . Chronic bilateral low back pain without sciatica 03/29/2018  . Colitis 03/10/2018  . AKI (acute kidney injury) ([place])   . Ascites   . Pulmonary embolism ([place]) 03/01/2018  . 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[place]) 03/01/2018  . Acute on chronic respiratory failure with hypoxia ([place]) 03/01/2018  . Angiodysplasia of intestine   . Hematochezia   . Malnutrition of moderate degree 02/15/2017  . Blood loss anemia 02/13/2017  . Impingement syndrome of left shoulder 02/10/2013  . PAD (peripheral artery disease) ([place])  11/17/2011  . Dyslipidemia 10/17/2011  . Coronary artery disease involving native coronary artery of native heart with angina pectoris ([place])   . Hypothyroidism 05/03/2008  . Essential hypertension 05/03/2008  . Sleep apnea 05/03/2008  . COPD (chronic obstructive pulmonary disease) ([place]) 04/12/2008   PCP:  [name], MD Pharmacy:   [facility], [place] [address] Phone: [phone] Fax: [place], [place] [address] Phone: [phone] Fax: [phone]     Social Determinants of Health (SDOH) Interventions    Readmission Risk Interventions No flowsheet data found.

## 2020-10-10 NOTE — Progress Notes (Signed)
ANTICOAGULATION CONSULT NOTE -   Pharmacy Consult for IV heparin Indication: presumed PE  Allergies  Allergen Reactions  . Ativan [Lorazepam] Other (See Comments)    Confusion   . Pitavastatin Other (See Comments)    LIVALO: reaction not recalled  . Ropinirole Nausea Only and Other (See Comments)    Makes legs jump, also  . Zofran [Ondansetron Hcl] Nausea Only  . Zolpidem Tartrate Anxiety and Other (See Comments)    CONFUSION   . Penicillins Rash and Other (See Comments)    Tolerated cephalosporins in past Has patient had a PCN reaction causing immediate rash, facial/tongue/throat swelling, SOB or lightheadedness with hypotension: Yes Has patient had a PCN reaction causing severe rash involving mucus membranes or skin necrosis: No Has patient had a PCN reaction that required hospitalization: No Has patient had a PCN reaction occurring within the last 10 years: No If all of the above answers are "NO", then may proceed with Cephalosporin use.     Patient Measurements: Height: 5\' 8"  (172.7 cm) Weight: 75.3 kg (166 lb 0.1 oz) IBW/kg (Calculated) : 63.9 Heparin Dosing Weight: 76 kg  Vital Signs: Temp: 98.5 F (36.9 C) (10/20 1600) Temp Source: Oral (10/20 1600) BP: 117/49 (10/20 1900) Pulse Rate: 91 (10/20 1900)  Labs: Recent Labs    10/09/20 1141 10/09/20 1355 10/09/20 2348 10/10/20 0245 10/10/20 0829 10/10/20 1841  HGB 10.6*  --   --  9.2*  --   --   HCT 33.5*  --   --  29.0*  --   --   PLT 280  --   --  249  --   --   HEPARINUNFRC  --   --  <0.10*  --  0.23* 0.30  CREATININE 1.97*  --   --  2.19*  --   --   TROPONINIHS 36* 41*  --   --  47*  --     Estimated Creatinine Clearance: 20.7 mL/min (A) (by C-G formula based on SCr of 2.19 mg/dL (H)).  Assessment: 79 yo female presented with shortness of breath to start IV heparin for presumed PE. Will need to follow up CT scan or VQ scan results to confirm PE. Baseline CBC and SCr drawn. Patient with listed hx of  DVTs of PMH but not currently on any anticoagulants. S/p IVC filter placement PTA.  10/10/2020 Heparin level = 0.23 - subtherapeutic after 2000 unit bolus and heparin drip increased to 1300 units/hr Hg 10.6>9.2, PLTC WNL No bleeding reported Heparin infusing well per RN LE dopplers: chronic R DVT (as PTA) UE dopplers: neg for DVT  PM: heparin level = 0.3, at low end of goal on 1450 units/hr.  No bleeding or complications documented.  Goal of Therapy:  Heparin level 0.3-0.7 units/ml Monitor platelets by anticoagulation protocol: Yes   Plan:  Increase IV heparin rate to 1500 units/hr Check heparin level 8 hours after increase Confirm PE after CT or VQ scan results known Daily CBC and heparin level while on heparin  Peggyann Juba, PharmD, BCPS Pharmacy: [phone] 10/10/2020 7:21 PM

## 2020-10-10 NOTE — Progress Notes (Addendum)
PROGRESS NOTE    [name]  HGD:[id number] DOB: [birth year] DOA: 10/09/2020 PCP: [name], MD   Chief Complaint  Patient presents with  . 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  Patient was seen in the urgent care 1 week ago for her cough and shortness of breath and CXR showed infiltrates and she was started on antibiotics.  She returned to the UC with worsening cough and shortness of breath and fever 100.65F at home.  She was having trouble with her O2 tank at home and was out of O2 and so she was noted to be 72% on room air at [facility].  Covid test was negative on both occasions.   States that she typically uses her O2 intermittently but has been requiring constant supplemental O2 over the past week.  Also has had some chest pain and has been taking so much nitroglycerin that she has run out of it in the past 24 hours.  Also admits to orthopnea and lower extremity edema.  ED Course: Afebrile, tachycardic, tachypneic, hypotensive (SBP 80s and 90s despite IV fluids) requiring 4 L/min.  Notable labs: Glucose 117, BUN 46, creatinine 1.97 (previously 1.19 on 09/10/2020), troponin 36, WBC 18.0, Hb 10.6, COVID-19 negative.  CXR with suspicion of bronchitis and hazy pneumonia in the mid and lower lungs with no dense consolidation or lobar collapse.  She was given azithromycin and ceftriaxone for CAP therapy and IV fluids and hospitalist was asked to admit.  Assessment & Plan:   Principal Problem:   Acute  on chronic respiratory failure with hypoxia (HCC) Active Problems:   Essential hypertension   AKI (acute kidney injury) ([place])   Acute on chronic diastolic heart failure (HCC)   History of pulmonary embolism   Hypotension   CAP (community acquired pneumonia)   1. Acute on chronic hypoxic respiratory failure, multifactorial: suspect 2/2 community acquired pneumonia with possible contribution from volume overload +/- PE Vinny Taranto. Currently tachycardic on 4 L [place] which is her home O2 need b. Continue ceftriaxone/doxycycline for concern for CAP c. Negative flu/covid testing, pending blood cx, negative MRSA pcr d. Cardiology following for concern for HF, appreciate assistance e. Regarding concern for PE, continue heparin gtt, pending V/Q scan when stable for this - echo notable for low normal RVSF f. See below for additional details   2. Septic Shock 2/2 CAP  Hypotension:  1. Continue ceftriaxone/doxycycline 2. Follow blood cultures, negative MRSA pcr, negative COVD and flu testing 3. Currently receiving phenylephrine via peripheral 4. Appreciate PCCM assistance 5. CXR yesterday with concern for bronchitis and pneumonia in mid and lower lungs 6. Repeat CXR today without acute findings  3. Chest Pain  CAD s/p CABG and DES:  1. Described CP this AM -> pressure - since this morning, maybe yesterday.  Troponin at presentation relatively flat.  EKG today with t wave inversions in I and aVL.  Repeat troponin pending.  Discussed with cardiology. 2. Morphine, nitro prn  3. Start aspirin, continue statin 4. Appreciate cardiology assistance  4. Recurrent  PE/DVT s/p IVC filter  Concern for Recurrent PE 1. S/p IVC filter 02/2018, not on anticoagulation 2/2 GI bleed and prior large abdominal wall hematoma 2. Concern for recurrent PE as noted above, follow VQ scan when able (vs CT PE protocol when renal function improves) 3. Ultrasound negative for DVT of R and L upper extremities, chronic DVT of R  popliteal vein   5. AKI on CKD 3a, Multifactorial: hemodynamic changes, lisinopril, possibly cardio renal Aleksander Edmiston. Hold lisinopril b. Creatinine worsening slightly c. Follow UA, consider renal US if not improving   4. Acute on chronic diastolic heart failure exacerbation Ajee Heasley. Suspect secondary to PE b. Cardiology consulted, appreciate assistance  5. COPD, possible mild exacerbation Jovonni Borquez. Duonebs and Albuterol  6. Hypothyroidism Messi Twedt. Continue synthroid  DVT prophylaxis: heparin Code Status: DNR Family Communication: son at bedsdie Disposition:   Status is: Inpatient  Remains inpatient appropriate because:Inpatient level of care appropriate due to severity of illness   Dispo: The patient is from: Home              Anticipated d/c is to: pending              Anticipated d/c date is: > 3 days              Patient currently is not medically stable to d/c.  Consultants:   PCCM  IR  Cardiology  Procedures:  [place] LE Summary:  RIGHT:  - Findings consistent with chronic deep vein thrombosis involving the  right popliteal vein.  - No cystic structure found in the popliteal fossa.    LEFT:  - There is no evidence of deep vein thrombosis in the lower extremity.    - No cystic structure found in the popliteal fossa.   [place] UE Summary:  No evidence of deep vein or superficial vein thrombosis involving the  right and  left upper extremities.  Echo IMPRESSIONS    1. Diastology suggestive of impaired LV filling. Left ventricular  ejection fraction, by estimation, is 60 to 65%. The left ventricle has  normal function. The left ventricle has no regional wall motion  abnormalities. There is moderate concentric left  ventricular hypertrophy. Left ventricular diastolic parameters are  indeterminate.  2. Right ventricular systolic function is low normal. The right  ventricular size is normal. There is normal pulmonary artery systolic  pressure.  3. Left atrial size was  moderately dilated.  4. Right atrial size was moderately dilated.  5. The mitral valve is myxomatous. No evidence of mitral valve  regurgitation. The mean mitral valve gradient is 5.0 mmHg with average  heart rate of 87 bpm. Moderate mitral annular calcification.  6. The aortic valve is tricuspid. Aortic valve regurgitation is not  visualized.  7. The inferior vena cava is dilated in size with <50% respiratory  variability, suggesting right atrial pressure of 15 mmHg.   Comparison(s): [name] prior study was performed on 10/05/2019. No significant  change from prior study. Subtle decrease in RV function.   Antimicrobials:  Anti-infectives (From admission, onward)   Start     Dose/Rate Route Frequency Ordered Stop   10/09/20 2200  doxycycline (VIBRA-TABS) tablet 100 mg        100 mg Oral Every 12 hours 10/09/20 1655 10/14/20 2159   10/09/20 1700  cefTRIAXone (ROCEPHIN) 2 g in sodium chloride 0.9 % 100 mL IVPB        2 g 200 mL/hr over 30 Minutes Intravenous Every 24 hours 10/09/20 1655 10/14/20  1659   10/09/20 1400  cefTRIAXone (ROCEPHIN) 1 g in sodium chloride 0.9 % 100 mL IVPB        1 g 200 mL/hr over 30 Minutes Intravenous  Once 10/09/20 1345 10/09/20 1448   10/09/20 1400  azithromycin (ZITHROMAX) 500 mg in sodium chloride 0.9 % 250 mL IVPB        500 mg 250 mL/hr over 60 Minutes Intravenous  Once 10/09/20 1345 10/09/20 1657     Subjective: C/o chest pressure  Objective: Vitals:   10/10/20 0600 10/10/20 0800 10/10/20 0814 10/10/20 0900  BP: (!) 108/45 (!) 105/32  (!) 112/51  Pulse: 93 91  (!) 112  Resp: 20 17  19   Temp:  98.9 F (37.2 C)    TempSrc:  Axillary    SpO2: 94% 94% 94% 91%  Weight:      Height:        Intake/Output Summary (Last 24 hours) at 10/10/2020 1105 Last data filed at 10/10/2020 0600 Gross per 24 hour  Intake 2585.21 ml  Output 0 ml  Net 2585.21 ml   Filed Weights   10/09/20 1123 10/10/20 0219  Weight: 76 kg 75.3 kg     Examination:  General exam: Appears calm and comfortable  Respiratory system: Clear to auscultation. Respiratory effort normal. Cardiovascular system: S1 & S2 heard, RRR.  Gastrointestinal system: Abdomen is nondistended, soft and nontender.  Central nervous system: Alert and oriented. No focal neurological deficits. Extremities: no LEE Skin: No rashes, lesions or ulcers Psychiatry: Judgement and insight appear normal. Mood & affect appropriate.     Data Reviewed: I have personally reviewed following labs and imaging studies  CBC: Recent Labs  Lab 10/09/20 1141 10/10/20 0245  WBC 18.0* 16.4*  HGB 10.6* 9.2*  HCT 33.5* 29.0*  MCV 93.8 94.2  PLT 280 716    Basic Metabolic Panel: Recent Labs  Lab 10/09/20 1141 10/10/20 0245  NA 138 133*  K 4.1 3.8  CL 97* 97*  CO2 28 24  GLUCOSE 117* 197*  BUN 46* 52*  CREATININE 1.97* 2.19*  CALCIUM 9.2 8.2*    GFR: Estimated Creatinine Clearance: 20.7 mL/min (Alex Mcmanigal) (by C-G formula based on SCr of 2.19 mg/dL (H)).  Liver Function Tests: No results for input(s): AST, ALT, ALKPHOS, BILITOT, PROT, ALBUMIN in the last 168 hours.  CBG: No results for input(s): GLUCAP in the last 168 hours.   Recent Results (from the past 240 hour(s))  Respiratory Panel by RT PCR (Flu Haislee Corso&B, Covid) - Nasopharyngeal Swab     Status: None   Collection Time: 10/09/20 12:11 PM   Specimen: Nasopharyngeal Swab  Result Value Ref Range Status   SARS Coronavirus 2 by RT PCR NEGATIVE NEGATIVE Final    Comment: (NOTE) SARS-CoV-2 target nucleic acids are NOT DETECTED.  The SARS-CoV-2 RNA is generally detectable in upper respiratoy specimens during the acute phase of infection. The lowest concentration of SARS-CoV-2 viral copies this assay can detect is 131 copies/mL. Briteny Fulghum negative result does not preclude SARS-Cov-2 infection and should not be used as the sole basis for treatment or other patient management decisions. Ralene Gasparyan negative result may occur with   improper specimen collection/handling, submission of specimen other than nasopharyngeal swab, presence of viral mutation(s) within the areas targeted by this assay, and inadequate number of viral copies (<131 copies/mL). Karah Caruthers negative result must be combined with clinical observations, patient history, and epidemiological information. The expected result is Negative.  Fact Sheet for Patients:  PinkCheek.be  Fact Sheet  for Healthcare Providers:  GravelBags.it  This test is no t yet approved or cleared by the [place] and  has been authorized for detection and/or diagnosis of SARS-CoV-2 by FDA under an Emergency Use Authorization (EUA). This EUA will remain  in effect (meaning this test can be used) for the duration of the COVID-19 declaration under Section 564(b)(1) of the Act, 21 U.S.C. section 360bbb-3(b)(1), unless the authorization is terminated or revoked sooner.     Influenza Ineta Sinning by PCR NEGATIVE NEGATIVE Final   Influenza B by PCR NEGATIVE NEGATIVE Final    Comment: (NOTE) The Xpert Xpress SARS-CoV-2/FLU/RSV assay is intended as an aid in  the diagnosis of influenza from Nasopharyngeal swab specimens and  should not be used as Briya Lookabaugh sole basis for treatment. Nasal washings and  aspirates are unacceptable for Xpert Xpress SARS-CoV-2/FLU/RSV  testing.  Fact Sheet for Patients: PinkCheek.be  Fact Sheet for Healthcare Providers: GravelBags.it  This test is not yet approved or cleared by the [place] FDA and  has been authorized for detection and/or diagnosis of SARS-CoV-2 by  FDA under an Emergency Use Authorization (EUA). This EUA will remain  in effect (meaning this test can be used) for the duration of the  Covid-19 declaration under Section 564(b)(1) of the Act, 21  U.S.C. section 360bbb-3(b)(1), unless the authorization is  terminated or  revoked. Performed at [facility], [place] [address] [place], [place] [address]   Culture, blood (routine x 2)     Status: None (Preliminary result)   Collection Time: 10/09/20 12:43 PM   Specimen: BLOOD  Result Value Ref Range Status   Specimen Description   Final    BLOOD LEFT ANTECUBITAL Performed at [facility] [address] [place], [place] [address]    Special Requests   Final    BOTTLES DRAWN AEROBIC AND ANAEROBIC Blood Culture adequate volume Performed at [facility] [address] [place], [place] [address]    Culture   Final    NO GROWTH < 24 HOURS Performed at [facility] [address], [place], [place] [address]    Report Status PENDING  Incomplete  Culture, blood (routine x 2)     Status: None (Preliminary result)   Collection Time: 10/09/20 12:48 PM   Specimen: BLOOD  Result Value Ref Range Status   Specimen Description   Final    BLOOD RIGHT ANTECUBITAL Performed at [facility] [address] [place], [place] [address]    Special Requests   Final    BOTTLES DRAWN AEROBIC AND ANAEROBIC Blood Culture results may not be optimal due to an inadequate volume of blood received in culture bottles Performed at [facility] [address], [place],  [address]    Culture   Final    NO GROWTH < 24 HOURS Performed at [facility] [address], [place],  [address]    Report Status PENDING  Incomplete  MRSA PCR Screening     Status: None   Collection Time: 10/10/20  1:45 AM   Specimen: Nasal Mucosa; Nasopharyngeal  Result Value Ref Range Status   MRSA by PCR NEGATIVE NEGATIVE Final    Comment:        The GeneXpert MRSA Assay (FDA approved for NASAL specimens only), is one component of Domique Clapper comprehensive MRSA colonization surveillance program. It is not intended to diagnose MRSA infection nor to guide or monitor treatment for MRSA  infections. Performed at [facility], [place] [address], [place], [place] [address]          Radiology Studies: DG CHEST PORT 1 VIEW  Result Date: 10/10/2020 CLINICAL DATA:  Dyspnea. EXAM: PORTABLE CHEST 1 VIEW COMPARISON:  October 09, 2020. FINDINGS: Stable cardiomediastinal silhouette. Status post coronary bypass graft. No pneumothorax or pleural effusion is noted. No acute pulmonary disease is noted. Status post right shoulder arthroplasty. IMPRESSION: No active disease. Aortic Atherosclerosis (ICD10-I70.0). Electronically Signed   By: Marijo Conception M.D.   On: 10/10/2020 10:13   DG Chest Port 1 View  Result Date: 10/09/2020 CLINICAL DATA:  Shortness of breath, cough and fever. EXAM: PORTABLE CHEST 1 VIEW COMPARISON:  10/05/2019 FINDINGS: Previous median sternotomy and CABG. Mild cardiomegaly. Aortic atherosclerosis. Bilateral breast implants with density overlying the chest. Suspicion of bronchitis and hazy pneumonia in the mid and lower lungs. No dense consolidation, lobar collapse or effusion. Old shoulder replacement on the right. Chronic degenerative changes of the left shoulder. IMPRESSION: 1. Suspicion of bronchitis and hazy pneumonia in the mid and lower lungs. No dense consolidation or lobar collapse. 2. Previous CABG. Mild cardiomegaly. Aortic atherosclerosis. Electronically Signed   By: Nelson Chimes M.D.   On: 10/09/2020 13:15   ECHOCARDIOGRAM COMPLETE  Result Date: 10/09/2020    ECHOCARDIOGRAM REPORT   Patient Name:   ANGELO CAROLL Date of Exam: 10/09/2020 Medical Rec #:  [id number]      Height:       68.0 in Accession #:    [id number]     Weight:       167.5 lb Date of Birth:  10-08-1941       BSA:          1.896 m Patient Age:    25 years       BP:           92/42 mmHg Patient Gender: F              HR:           88 bpm. Exam Location:  Inpatient Procedure: Cardiac Doppler, Color Doppler, 2D Echo and 3D Echo STAT ECHO Indications:    R07.9* Chest pain,  unspecified  History:        Patient has prior history of Echocardiogram examinations, most                 recent 10/05/2019. CAD, Abnormal ECG and Prior CABG, COPD,                 Signs/Symptoms:Dyspnea; Risk Factors:Sleep Apnea and                 Dyslipidemia. Hypoxia. Pulmonary embolus.  Sonographer:    Roseanna Rainbow RDCS Referring Phys: [id number] [place]  1. Diastology suggestive of impaired LV filling. Left ventricular ejection fraction, by estimation, is 60 to 65%. The left ventricle has normal function. The left ventricle has no regional wall motion abnormalities. There is moderate concentric left ventricular hypertrophy. Left ventricular diastolic parameters are indeterminate.  2. Right ventricular systolic function is low normal. The right ventricular size is normal. There is normal pulmonary artery systolic pressure.  3. Left atrial size was moderately dilated.  4. Right atrial size was moderately dilated.  5. The mitral valve is myxomatous. No evidence of mitral valve regurgitation. The mean mitral valve gradient is 5.0 mmHg with average heart rate of 87 bpm. Moderate mitral annular calcification.  6. The  aortic valve is tricuspid. Aortic valve regurgitation is not visualized.  7. The inferior vena cava is dilated in size with <50% respiratory variability, suggesting right atrial pressure of 15 mmHg. Comparison(s): Chequita Mofield prior study was performed on 10/05/2019. No significant change from prior study. Subtle decrease in RV function. FINDINGS  Left Ventricle: Diastology suggestive of impaired LV filling. Left ventricular ejection fraction, by estimation, is 60 to 65%. The left ventricle has normal function. The left ventricle has no regional wall motion abnormalities. The left ventricular internal cavity size was normal in size. There is moderate concentric left ventricular hypertrophy. Left ventricular diastolic parameters are indeterminate. Right Ventricle: The right ventricular size is  normal. Right vetricular wall thickness was not well visualized. Right ventricular systolic function is low normal. There is normal pulmonary artery systolic pressure. The tricuspid regurgitant velocity is 2.30 m/s, and with an assumed right atrial pressure of 3 mmHg, the estimated right ventricular systolic pressure is 21.1 mmHg. Left Atrium: Left atrial size was moderately dilated. Right Atrium: Right atrial size was moderately dilated. Pericardium: There is no evidence of pericardial effusion. Mitral Valve: The mitral valve is myxomatous. There is mild calcification of the mitral valve leaflet(s). Moderate mitral annular calcification. No evidence of mitral valve regurgitation. MV peak gradient, 25.4 mmHg. The mean mitral valve gradient is 5.0  mmHg with average heart rate of 87 bpm. Tricuspid Valve: The tricuspid valve is grossly normal. Tricuspid valve regurgitation is trivial. Aortic Valve: The aortic valve is tricuspid. Aortic valve regurgitation is not visualized. Pulmonic Valve: The pulmonic valve was not well visualized. Pulmonic valve regurgitation is trivial. Aorta: The aortic root and ascending aorta are structurally normal, with no evidence of dilitation. Venous: The inferior vena cava is dilated in size with less than 50% respiratory variability, suggesting right atrial pressure of 15 mmHg. IAS/Shunts: The interatrial septum appears to be lipomatous. The atrial septum is grossly normal.  LEFT VENTRICLE PLAX 2D LVIDd:         4.60 cm     Diastology LVIDs:         3.00 cm     LV e' medial:    5.87 cm/s LV PW:         1.40 cm     LV E/e' medial:  27.1 LV IVS:        1.40 cm     LV e' lateral:   8.21 cm/s LVOT diam:     1.70 cm     LV E/e' lateral: 19.4 LV SV:         62 LV SV Index:   33 LVOT Area:     2.27 cm  LV Volumes (MOD) LV vol d, MOD A2C: 99.9 ml LV vol d, MOD A4C: 71.4 ml LV vol s, MOD A2C: 35.9 ml LV vol s, MOD A4C: 26.8 ml LV SV MOD A2C:     64.0 ml LV SV MOD A4C:     71.4 ml LV SV MOD BP:       56.6 ml RIGHT VENTRICLE            IVC RV S prime:     8.08 cm/s  IVC diam: 3.00 cm TAPSE (M-mode): 1.4 cm LEFT ATRIUM             Index       RIGHT ATRIUM           Index LA diam:        3.80 cm 2.00 cm/m  RA  Area:     17.30 cm LA Vol (A2C):   56.3 ml 29.70 ml/m RA Volume:   44.90 ml  23.69 ml/m LA Vol (A4C):   44.9 ml 23.69 ml/m LA Biplane Vol: 55.6 ml 29.33 ml/m  AORTIC VALVE LVOT Vmax:   129.00 cm/s LVOT Vmean:  86.900 cm/s LVOT VTI:    0.272 m  AORTA Ao Root diam: 3.40 cm Ao Asc diam:  3.40 cm MITRAL VALVE                TRICUSPID VALVE MV Area (PHT): 4.15 cm     TR Peak grad:   21.2 mmHg MV Peak grad:  25.4 mmHg    TR Vmax:        230.00 cm/s MV Mean grad:  5.0 mmHg MV Vmax:       2.52 m/s     SHUNTS MV Vmean:      88.8 cm/s    Systemic VTI:  0.27 m MV Decel Time: 183 msec     Systemic Diam: 1.70 cm MV E velocity: 159.00 cm/s MV Dakwan Pridgen velocity: 209.00 cm/s MV E/Maan Zarcone ratio:  0.76 Rudean Haskell MD Electronically signed by Rudean Haskell MD Signature Date/Time: 10/09/2020/4:38:55 PM    Final    VAS [place] LOWER EXTREMITY VENOUS (DVT)  Result Date: 10/10/2020  Lower Venous DVTStudy Indications: Elevated ddimer.  Performing Technologist: Abram Sander RVS  Examination Guidelines: Lamekia Nolden complete evaluation includes B-mode imaging, spectral Doppler, color Doppler, and power Doppler as needed of all accessible portions of each vessel. Bilateral testing is considered an integral part of Fama Muenchow complete examination. Limited examinations for reoccurring indications may be performed as noted. The reflux portion of the exam is performed with the patient in reverse Trendelenburg.  +---------+---------------+---------+-----------+----------+--------------+ RIGHT    CompressibilityPhasicitySpontaneityPropertiesThrombus Aging +---------+---------------+---------+-----------+----------+--------------+ CFV      Full           Yes      Yes                                  +---------+---------------+---------+-----------+----------+--------------+ SFJ      Full                                                        +---------+---------------+---------+-----------+----------+--------------+ FV Prox  Full                                                        +---------+---------------+---------+-----------+----------+--------------+ FV Mid   Full                                                        +---------+---------------+---------+-----------+----------+--------------+ FV DistalFull                                                        +---------+---------------+---------+-----------+----------+--------------+  PFV      Full                                                        +---------+---------------+---------+-----------+----------+--------------+ POP      None           Yes      Yes                  Chronic        +---------+---------------+---------+-----------+----------+--------------+ PTV      Full                                                        +---------+---------------+---------+-----------+----------+--------------+ PERO     Full                                                        +---------+---------------+---------+-----------+----------+--------------+   +---------+---------------+---------+-----------+----------+--------------+ LEFT     CompressibilityPhasicitySpontaneityPropertiesThrombus Aging +---------+---------------+---------+-----------+----------+--------------+ CFV      Full           Yes      Yes                                 +---------+---------------+---------+-----------+----------+--------------+ SFJ      Full                                                        +---------+---------------+---------+-----------+----------+--------------+ FV Prox  Full                                                         +---------+---------------+---------+-----------+----------+--------------+ FV Mid   Full                                                        +---------+---------------+---------+-----------+----------+--------------+ FV DistalFull                                                        +---------+---------------+---------+-----------+----------+--------------+ PFV      Full                                                        +---------+---------------+---------+-----------+----------+--------------+  POP      Full           Yes      Yes                                 +---------+---------------+---------+-----------+----------+--------------+ PTV      Full                                                        +---------+---------------+---------+-----------+----------+--------------+ PERO     Full                                                        +---------+---------------+---------+-----------+----------+--------------+     Summary: RIGHT: - Findings consistent with chronic deep vein thrombosis involving the right popliteal vein. - No cystic structure found in the popliteal fossa.  LEFT: - There is no evidence of deep vein thrombosis in the lower extremity.  - No cystic structure found in the popliteal fossa.  *See table(s) above for measurements and observations.    Preliminary    VAS [place] UPPER EXTREMITY VENOUS DUPLEX  Result Date: 10/10/2020 UPPER VENOUS STUDY  Indications: elevated ddimer Comparison Study: no prior Performing Technologist: Abram Sander RVS  Examination Guidelines: Jowel Waltner complete evaluation includes B-mode imaging, spectral Doppler, color Doppler, and power Doppler as needed of all accessible portions of each vessel. Bilateral testing is considered an integral part of [place] [place] complete examination. Limited examinations for reoccurring indications may be performed as noted.  Right Findings:  +----------+------------+---------+-----------+----------+-------+ RIGHT     CompressiblePhasicitySpontaneousPropertiesSummary +----------+------------+---------+-----------+----------+-------+ IJV           Full       Yes       Yes                      +----------+------------+---------+-----------+----------+-------+ Subclavian    Full       Yes       Yes                      +----------+------------+---------+-----------+----------+-------+ Axillary      Full       Yes       Yes                      +----------+------------+---------+-----------+----------+-------+ Brachial      Full       Yes       Yes                      +----------+------------+---------+-----------+----------+-------+ Radial        Full                                          +----------+------------+---------+-----------+----------+-------+ Ulnar         Full                                          +----------+------------+---------+-----------+----------+-------+  Cephalic      Full                                          +----------+------------+---------+-----------+----------+-------+ Basilic       Full                                          +----------+------------+---------+-----------+----------+-------+  Left Findings: +----------+------------+---------+-----------+----------+-------+ LEFT      CompressiblePhasicitySpontaneousPropertiesSummary +----------+------------+---------+-----------+----------+-------+ IJV           Full       Yes       Yes                      +----------+------------+---------+-----------+----------+-------+ Subclavian    Full       Yes       Yes                      +----------+------------+---------+-----------+----------+-------+ Axillary      Full       Yes       Yes                      +----------+------------+---------+-----------+----------+-------+ Brachial      Full       Yes       Yes                       +----------+------------+---------+-----------+----------+-------+ Radial        Full                                          +----------+------------+---------+-----------+----------+-------+ Ulnar         Full                                          +----------+------------+---------+-----------+----------+-------+ Cephalic      Full                                          +----------+------------+---------+-----------+----------+-------+ Basilic       Full                                          +----------+------------+---------+-----------+----------+-------+  Summary: No evidence of deep vein or superficial vein thrombosis involving the right and left upper extremities.  *See table(s) above for measurements and observations.    Preliminary         Scheduled Meds: . allopurinol  100 mg Oral Daily  . arformoterol  15 mcg Nebulization BID  . ARIPiprazole  2 mg Oral Daily  . ascorbic acid  500 mg Oral Daily  . aspirin  81 mg Oral Daily  . atorvastatin  10 mg Oral Daily  . budesonide (PULMICORT) nebulizer solution  0.5 mg Nebulization BID  . chlorhexidine  15 mL Mouth Rinse BID  . Chlorhexidine  Gluconate Cloth  6 each Topical Daily  . citalopram  40 mg Oral QHS  . doxycycline  100 mg Oral Q12H  . [START ON 10/11/2020] ferrous sulfate  325 mg Oral Q breakfast  . gabapentin  100 mg Oral Daily  . ipratropium-albuterol  3 mL Nebulization TID  . levothyroxine  50 mcg Oral Q0600  . magnesium oxide  400 mg Oral BID  . mouth rinse  15 mL Mouth Rinse q12n4p  . melatonin  10 mg Oral QHS  . pantoprazole  40 mg Oral QHS  . pilocarpine  1 drop Both Eyes BID  . polyethylene glycol  17 g Oral Daily  . pramipexole  0.25 mg Oral QPM  . predniSONE  40 mg Oral Q breakfast  . roflumilast  500 mcg Oral QPM  . sodium chloride flush  3 mL Intravenous Q12H  . traZODone  150 mg Oral QHS   Continuous Infusions: . sodium chloride 10 mL/hr at 10/10/20 0600  .  cefTRIAXone (ROCEPHIN)  IV Stopped (10/09/20 2023)  . heparin 1,450 Units/hr (10/10/20 1009)  . phenylephrine (NEO-SYNEPHRINE) Adult infusion 45 mcg/min (10/10/20 1019)     LOS: 1 day    Time spent: over 30 min    Fayrene Helper, MD [facility]   To contact the attending provider between 7A-7P or the covering provider during after hours 7P-7A, please log into the web site [email or web address] and access using universal [place] password for that web site. If you do not have the password, please call the hospital operator.  10/10/2020, 11:05 AM

## 2020-10-10 NOTE — Progress Notes (Signed)
ANTICOAGULATION CONSULT NOTE -   Pharmacy Consult for IV heparin Indication: presumed PE  Allergies  Allergen Reactions  . Ativan [Lorazepam] Other (See Comments)    Confusion   . Pitavastatin Other (See Comments)    LIVALO: reaction not recalled  . Ropinirole Nausea Only and Other (See Comments)    Makes legs jump, also  . Zofran [Ondansetron Hcl] Nausea Only  . Zolpidem Tartrate Anxiety and Other (See Comments)    CONFUSION   . Penicillins Rash and Other (See Comments)    Tolerated cephalosporins in past Has patient had a PCN reaction causing immediate rash, facial/tongue/throat swelling, SOB or lightheadedness with hypotension: Yes Has patient had a PCN reaction causing severe rash involving mucus membranes or skin necrosis: No Has patient had a PCN reaction that required hospitalization: No Has patient had a PCN reaction occurring within the last 10 years: No If all of the above answers are "NO", then may proceed with Cephalosporin use.     Patient Measurements: Height: 5\' 8"  (172.7 cm) Weight: 76 kg (167 lb 8.8 oz) IBW/kg (Calculated) : 63.9 Heparin Dosing Weight: 76 kg  Vital Signs: Temp: 98.7 F (37.1 C) (10/19 1841) Temp Source: Oral (10/19 1841) BP: 96/49 (10/20 0007) Pulse Rate: 79 (10/20 0007)  Labs: Recent Labs    10/09/20 1141 10/09/20 1355 10/09/20 2348  HGB 10.6*  --   --   HCT 33.5*  --   --   PLT 280  --   --   HEPARINUNFRC  --   --  <0.10*  CREATININE 1.97*  --   --   TROPONINIHS 36* 41*  --     Estimated Creatinine Clearance: 23 mL/min (A) (by C-G formula based on SCr of 1.97 mg/dL (H)).   Medical History: Past Medical History:  Diagnosis Date  . Anemia    bld. transfusion post lumbar surgery- 2012  . Anxiety   . Arthralgia    NOS  . Blood transfusion 2012; 02/2018   WITH BACK SURGERY; "related to hematoma in stomach" (10/06/2018)  . CAD (coronary artery disease)    s/p CABG 2004; s/p DES to LM in 2010;  [facility] 10/29/11: EF 50-55%, mild  elevated filling pressures, no pulmonary HTN, LM 90% ISR, LAD and CFX occluded, S-RI occluded (old), S-OM3 ok and L-LAD ok, native nondominant RCA 95% -  med rx recommended ; Lexiscan Myoview 7/13 at [facility]: demonstrated "normal LV function, anterior attenuation and localized ischemia, inferior, basilar, mid section"  . Carotid artery disease ([place])    Carotid US 1/74:  RICA 9-44; LICA 96-75; R subclavian stenosis - Repeat 1 year. // Carotid US 05/2019: R 1-39; L 40-59; R vertebral with atypical antegrade flow; R subclavian stenosis >> repeat 1 year // Carotid US 7/21: Bilat 40-59; L subclavian stenosis   . CHF (congestive heart failure) ([place]) 01/21/2020  . Chronic diastolic heart failure (HCC)    Echo 9/10: EF 91-63%, grade 1 diastolic dysfunction  . Chronic lower back pain   . COPD (chronic obstructive pulmonary disease) ([place])    Emphysema dxed by Dr. Woody Seller in [place] based on PFTs per pt in 2006; placed on albuterol  . Depression    TAKES CELEXA  AND  (OFF- WELBUTRIN)  . Diuretic-induced hypokalemia 10/08/2018  . DVT of lower extremity (deep venous thrombosis) (HCC)    recurrent. bilateral (2 episodes)  . Dyspnea    home o2 when needed  . Dysrhythmia    afib with cabg  . Exertional angina (  [place])    Treated with Isosorbide, Ranexa, amlodipine; intolerant to metoprolol  . GERD (gastroesophageal reflux disease)   . Gout    "on daily RX" (10/06/2018)  . HLD (hyperlipidemia)   . Hypertension   . Hyperthyroidism   . L Subclavian Artery Stenosis    Carotid US 7/21   . Obesity (BMI 30-39.9) 2009   BMI 33  . Osteoarthritis    "all over; hands" (10/06/2018)  . Oxygen deficiency   . Oxygen dependent    4L at home as needed (10/06/2018)  . Pneumonia   . PVD (peripheral vascular disease) ([place])   . Sleep apnea 2012   USED CPAP THEN  STARTED USING SPIRIVA , Nov. 2013- last evaluation , changed from mask to aparatus that is just for her nose.. ; reports 05-28-18 "the mask smothers me so i  dont use it right now" (10/06/2018)    Medications:  Scheduled:  . arformoterol  15 mcg Nebulization BID  . atorvastatin  10 mg Oral Daily  . budesonide (PULMICORT) nebulizer solution  0.5 mg Nebulization BID  . doxycycline  100 mg Oral Q12H  . gabapentin  100 mg Oral Daily  . heparin  2,000 Units Intravenous Once  . ipratropium-albuterol  3 mL Nebulization Q6H  . levothyroxine  50 mcg Oral Q0600  . melatonin  10 mg Oral QHS  . predniSONE  40 mg Oral Q breakfast  . sodium chloride flush  3 mL Intravenous Q12H  . traZODone  150 mg Oral QHS   Infusions:  . cefTRIAXone (ROCEPHIN)  IV Stopped (10/09/20 2023)  . heparin 1,100 Units/hr (10/09/20 1545)    Assessment: 79 yo female presented with shortness of breath to start IV heparin for presumed PE. Will need to follow up CT scan or VQ scan results to confirm PE. Baseline CBC and SCr drawn. Patient with listed hx of DVTs of PMH but not currently on any anticoagulants.   1st HL < 0.1, subtherapeutic Per RN no interruptions or line issues with heparin drip  Goal of Therapy:  Heparin level 0.3-0.7 units/ml Monitor platelets by anticoagulation protocol: Yes   Plan:  IV heparin 2000 unit bolus then increase rate to 1300 units/hr Check heparin level 8 hours after increase Confirm PE after CT or VQ scan results known   Dolly Rias RPh 10/10/2020, 12:36 AM

## 2020-10-10 NOTE — Progress Notes (Signed)
eLink Physician-Brief Progress Note Patient Name: [name] DOB: July 26, 1941 MRN: [id number]   Date of Service  10/10/2020  HPI/Events of Note  Hypotension - BP = 87/33 with MAP = 48. LVEF = 60-65%  eICU Interventions  Plan: 1. Bolus with 0.9 NaCl 500 mL IV over 30 minutes now. 2. Phenylephrine IV infusion via PIV. Titrate to MAP >= 65.      Intervention Category Major Interventions: Hypotension - evaluation and management  [name] 10/10/2020, 2:24 AM

## 2020-10-10 NOTE — Care Plan (Signed)
Spoke with ELink RN re: pt's BP goal. Current BP 87/33 with a MAP of 48. Awaiting orders at this time.

## 2020-10-11 ENCOUNTER — Inpatient Hospital Stay (HOSPITAL_COMMUNITY): Payer: Medicare HMO

## 2020-10-11 DIAGNOSIS — J9621 Acute and chronic respiratory failure with hypoxia: Secondary | ICD-10-CM | POA: Diagnosis not present

## 2020-10-11 LAB — COMPREHENSIVE METABOLIC PANEL
ALT: 30 U/L (ref 0–44)
AST: 43 U/L — ABNORMAL HIGH (ref 15–41)
Albumin: 2.4 g/dL — ABNORMAL LOW (ref 3.5–5.0)
Alkaline Phosphatase: 74 U/L (ref 38–126)
Anion gap: 10 (ref 5–15)
BUN: 47 mg/dL — ABNORMAL HIGH (ref 8–23)
CO2: 25 mmol/L (ref 22–32)
Calcium: 8.6 mg/dL — ABNORMAL LOW (ref 8.9–10.3)
Chloride: 102 mmol/L (ref 98–111)
Creatinine, Ser: 1.42 mg/dL — ABNORMAL HIGH (ref 0.44–1.00)
GFR, Estimated: 35 mL/min — ABNORMAL LOW (ref >60)
Glucose, Bld: 139 mg/dL — ABNORMAL HIGH (ref 70–99)
Potassium: 4 mmol/L (ref 3.5–5.1)
Sodium: 137 mmol/L (ref 135–145)
Total Bilirubin: 0.5 mg/dL (ref 0.3–1.2)
Total Protein: 6.2 g/dL — ABNORMAL LOW (ref 6.5–8.1)

## 2020-10-11 LAB — CBC
HCT: 33 % — ABNORMAL LOW (ref 36.0–46.0)
Hemoglobin: 10.5 g/dL — ABNORMAL LOW (ref 12.0–15.0)
MCH: 29.2 pg (ref 26.0–34.0)
MCHC: 31.8 g/dL (ref 30.0–36.0)
MCV: 91.7 fL (ref 80.0–100.0)
Platelets: 405 10*3/uL — ABNORMAL HIGH (ref 150–400)
RBC: 3.6 MIL/uL — ABNORMAL LOW (ref 3.87–5.11)
RDW: 15.4 % (ref 11.5–15.5)
WBC: 36.9 10*3/uL — ABNORMAL HIGH (ref 4.0–10.5)
nRBC: 0 % (ref 0.0–0.2)

## 2020-10-11 LAB — HEPARIN LEVEL (UNFRACTIONATED)
Heparin Unfractionated: 0.45 IU/mL (ref 0.30–0.70)
Heparin Unfractionated: 0.41 IU/mL (ref 0.30–0.70)

## 2020-10-11 LAB — PHOSPHORUS: Phosphorus: 2.8 mg/dL (ref 2.5–4.6)

## 2020-10-11 LAB — MAGNESIUM: Magnesium: 2.5 mg/dL — ABNORMAL HIGH (ref 1.7–2.4)

## 2020-10-11 MED ORDER — GUAIFENESIN 100 MG/5ML PO SOLN
5.0000 mL | ORAL | Status: DC | PRN
  Administered 2020-10-12 – 2020-10-13 (×4): 100 mg via ORAL
  Filled 2020-10-11 (×4): qty 10

## 2020-10-11 MED ORDER — ENOXAPARIN SODIUM 40 MG/0.4ML ~~LOC~~ SOLN
40.0000 mg | SUBCUTANEOUS | Status: DC
  Administered 2020-10-11 – 2020-10-12 (×2): 40 mg via SUBCUTANEOUS
  Filled 2020-10-11 (×2): qty 0.4

## 2020-10-11 MED ORDER — TECHNETIUM TO 99M ALBUMIN AGGREGATED
4.2000 | Freq: Once | INTRAVENOUS | Status: AC | PRN
  Administered 2020-10-11: 4.2 via INTRAVENOUS

## 2020-10-11 NOTE — Progress Notes (Signed)
ANTICOAGULATION CONSULT NOTE -   Pharmacy Consult for IV heparin Indication: presumed PE  Allergies  Allergen Reactions  . Ativan [Lorazepam] Other (See Comments)    Confusion   . Pitavastatin Other (See Comments)    LIVALO: reaction not recalled  . Ropinirole Nausea Only and Other (See Comments)    Makes legs jump, also  . Zofran [Ondansetron Hcl] Nausea Only  . Zolpidem Tartrate Anxiety and Other (See Comments)    CONFUSION   . Penicillins Rash and Other (See Comments)    Tolerated cephalosporins in past Has patient had a PCN reaction causing immediate rash, facial/tongue/throat swelling, SOB or lightheadedness with hypotension: Yes Has patient had a PCN reaction causing severe rash involving mucus membranes or skin necrosis: No Has patient had a PCN reaction that required hospitalization: No Has patient had a PCN reaction occurring within the last 10 years: No If all of the above answers are "NO", then may proceed with Cephalosporin use.     Patient Measurements: Height: 5\' 8"  (172.7 cm) Weight: 81.3 kg (179 lb 3.7 oz) IBW/kg (Calculated) : 63.9 Heparin Dosing Weight: 76 kg  Vital Signs: Temp: 98.3 F (36.8 C) (10/20 2300) Temp Source: Oral (10/20 2300) BP: 118/47 (10/21 0400) Pulse Rate: 102 (10/21 0400)  Labs: Recent Labs     0000 10/09/20 1141 10/09/20 1355 10/09/20 2348 10/10/20 0245 10/10/20 0829 10/10/20 1841 10/11/20 0259 10/11/20 0300  HGB   < > 10.6*  --   --  9.2*  --   --   --  10.5*  HCT  --  33.5*  --   --  29.0*  --   --   --  33.0*  PLT  --  280  --   --  249  --   --   --  405*  HEPARINUNFRC  --   --   --    < >  --  0.23* 0.30 0.45  --   CREATININE  --  1.97*  --   --  2.19*  --   --   --  1.42*  TROPONINIHS  --  36* 41*  --   --  47*  --   --   --    < > = values in this interval not displayed.    Estimated Creatinine Clearance: 35.4 mL/min (A) (by C-G formula based on SCr of 1.42 mg/dL (H)).  Assessment: 79 yo female presented with  shortness of breath to start IV heparin for presumed PE. Will need to follow up CT scan or VQ scan results to confirm PE. Baseline CBC and SCr drawn. Patient with listed hx of DVTs of PMH but not currently on any anticoagulants. S/p IVC filter placement PTA.  10/11/2020 Heparin level = 0.45, therapeutic on 1500 units/hr Hgb low but stable, plts elevated to 405 LE dopplers: chronic R DVT (as PTA) UE dopplers: neg for DVT   Goal of Therapy:  Heparin level 0.3-0.7 units/ml Monitor platelets by anticoagulation protocol: Yes   Plan:  Continue heparin drip at 1500 units/hr Check heparin level 8 hours  Confirm PE after CT or VQ scan results known Daily CBC and heparin level while on heparin  Dolly Rias RPh 10/11/2020, 4:47 AM

## 2020-10-11 NOTE — Progress Notes (Signed)
Pt on pressors, PCCM following.  Will sign off.   Please let us know if we can be of assistance.

## 2020-10-11 NOTE — Progress Notes (Signed)
NAME:  [name], MRN:  [id number], DOB:  Dec 03, 1941, LOS: 2 ADMISSION DATE:  10/09/2020, CONSULTATION DATE:  target organ damage REFERRING MD: Lanier Clam, MD CHIEF COMPLAINT: Cough, shortness of breath  Brief History   This is an 79 year old woman with past history of CAD status post CABG, congestive heart failure with admissions in the past, severe COPD due to former tobacco abuse, chronic hypoxemic respiratory failure requiring 4 L nasal cannula, history of DVT who could not tolerate anticoagulation status post IVC filter still in place baseline who presented to the ED from her doctor's office with a couple days of increased cough, congestion, shortness of breath, and mild fever number seen in consultation at the request of Madelin Headings, MD for evaluation of acute on chronic hypoxemic respiratory failure.  History of present illness   Patient is felt a bit ill the last couple of days.  Notes increased cough, chest congestion.  Notes borderline fevers at home as high as the 100.8 Fahrenheit.  Coughing up more sputum than normal.  Increased shortness of breath including worsening wheeze.  Shortness of breath with exertion.  Notably, more short of breath when lying flat as well.  Last couple days has had poor appetite but has been drinking a lot of water per her and son at bedside.  She went to urgent care recently with negative Covid testing was given some antibiotics. she still did not feel well so went to her doctor's office this morning.  Her portable tank ran out of oxygen.  The doctor's office had no oxygen.  EMS was called as her sats dropped.  Reportedly 60s on room air.  Came to the ED after nebulizer treatment up to the 90s.  Was placed on 4 L nasal cannula, 97% at time of my evaluation.  Lab work notable for increased creatinine 1.9 from baseline 1.3-1.5, elevated proBNP 491.4 which is the highest value in our system and double values when she was admitted for congestive heart  failure exacerbation in 2020.  Her troponin was mildly elevated.  Chest x-ray notable for hazy bilateral interstitial prominence most notable centrally but also peripherally when compared to most recent film in 2020 my interpretation.  Her lactic acid is within normal limits.  Blood pressures have been borderline.  A D-dimer was obtained which was grossly positive.  Unable to obtain CTA PE protocol due to AKI.  Echocardiogram was obtained which demonstrated impaired LV filling, normal EF, low normal RV function, mildly dilated LA, moderately dilated RA, elevated right atrial pressure 15 mmHg.  Past Medical History  CAD, CKD, COPD, chronic hypoxic respiratory failure, DVT/PE  Significant Hospital Events   Admitted 10/19  Consults:  Cardiology, VIR, pulmonary  Procedures:  N/A  Significant Diagnostic Tests:  Elevated D-dimer, proBNP elevated   Micro Data:  Blood cultures pending  Antimicrobials:  Ceftriaxone, doxycycline, azithromycin  Interim history/subjective:  Breathing stable. Pressors up and down currently off. Cr and UOP imrpoving. Complains of diffuse pain.   Objective   Blood pressure (!) 141/55, pulse 92, temperature (!) 97.5 F (36.4 C), temperature source Oral, resp. rate (!) 23, height 5\' 8"  (1.727 m), weight 81.3 kg, SpO2 98 %.        Intake/Output Summary (Last 24 hours) at 10/11/2020 1014 Last data filed at 10/11/2020 0700 Gross per 24 hour  Intake 3161.49 ml  Output 2900 ml  Net 261.49 ml   Filed Weights   10/09/20 1123 10/10/20 0219 10/11/20 0406  Weight: 76  kg 75.3 kg 81.3 kg    Examination: General: Chronically ill-appearing, in no acute distress Eyes: EOMI, no icterus Neck: JVP 4 cm above clavicle with head of bed at 60 degrees, estimated central venous pressure mid to low teens, supple Respiratory: Inspiratory wheeze much more prominent on the left, inspiratory rhonchi on the right, normal work of breathing on 4 L nasal cannula Cardiovascular:  tachycardic and rhythm, no murmurs Abdomen: Soft, nondistended, bowel sounds present Extremities: Warm, 1+ pitting edema to midshin bilaterally, Neuro: No weakness, sensation intact Psych: Normal mood, full affect  Resolved Hospital Problem list   N/A  Assessment & Plan:  Chronic hypoxemic respiratory failure: Currently stable on chronic her home 4 L nasal cannula with sats in high 90s.  Likely combination of severe COPD as well as element of chronic volume overload.  Septic shock due to community-acquired pneumonia: Fever, leukocytosis, chest x-ray infiltrates. Needing low dose pressor, Renal dysfunction present. Both improving. --Continue Coverage with doxycycline and ceftriaxone x 5 days --MAP > 65  Acute Renal Failure: ATN due to hypotension, septic shock. UOP poor since admission. Cr elevated above baseline. Both improving. Has received 2.5 L resuscitation. Signs of chronic volume overload on exam and RA pressure estimated 15 on TTE. --pressors to keep MAP > 65,has improved renal perfusion and yields improved UOP --Stable on home O2  COPD with acute exacerbation: Increase sputum, congestion, cough with wheeze on exam.  Chest x-ray with hazy infiltrates suspicious for infectious etiology.  Covid negative.  Clinically, suspect this is the largest contributor to her presentation. --Continue antibiotics --Prednisone 40 mg daily x 5 days --Bronchodilator therapies via nebulizer ordered  Volume overload: She has been edema on shins as well as elevated JVP on exam and estimated elevated RA pressure.  She endorses orthopnea.  Do feel there is an element of volume overload although this may be more chronic in nature.  Given borderline blood pressures at this time, do not feel she warrants diuresis especially in the setting of stability on home O2 requirement.  Do think eventually this hospitalization she very well may need increased Lasix support. --Continue to monitor, consider Lasix in the  future once blood pressure stabilizes  Elevated D-dimer: Concern for PE given history of same, DVT.  Do feel with her chest x-ray infiltrates and cough, congestion and mild volume overload, we have other etiologies for her presentation.  However given elevated D-dimer do feel obligated to evaluate for PE.  Notably, presuming it is in the correct position, her IVC filter should protect her from lower extremity DVTs causing PE.  Upper extremity DVTs: Causing PE are less common but possible. --VQ today, extremity dopplers negative for acute PE, known chronic  --Continue therapeutic heparin for now  CAD: Appreciate cardiology evaluation. Having chest pain, seems MSK, trops flat, EKG ok. Has been "medically" managed with heparin but low suspicion for ACS. --Continue home meds --On heparin drip approaching 48 hrs (for presumed PE)  Labs   CBC: Recent Labs  Lab 10/09/20 1141 10/10/20 0245 10/11/20 0300  WBC 18.0* 16.4* 36.9*  HGB 10.6* 9.2* 10.5*  HCT 33.5* 29.0* 33.0*  MCV 93.8 94.2 91.7  PLT 280 249 405*    Basic Metabolic Panel: Recent Labs  Lab 10/09/20 1141 10/10/20 0245 10/11/20 0300  NA 138 133* 137  K 4.1 3.8 4.0  CL 97* 97* 102  CO2 28 24 25   GLUCOSE 117* 197* 139*  BUN 46* 52* 47*  CREATININE 1.97* 2.19* 1.42*  CALCIUM 9.2  8.2* 8.6*  MG  --   --  2.5*  PHOS  --   --  2.8   GFR: Estimated Creatinine Clearance: 35.4 mL/min (A) (by C-G formula based on SCr of 1.42 mg/dL (H)). Recent Labs  Lab 10/09/20 1141 10/09/20 1238 10/09/20 1355 10/10/20 0245 10/11/20 0300  PROCALCITON  --   --  0.61  --   --   WBC 18.0*  --   --  16.4* 36.9*  LATICACIDVEN  --  1.2  --   --   --     Liver Function Tests: Recent Labs  Lab 10/11/20 0300  AST 43*  ALT 30  ALKPHOS 74  BILITOT 0.5  PROT 6.2*  ALBUMIN 2.4*   No results for input(s): LIPASE, AMYLASE in the last 168 hours. No results for input(s): AMMONIA in the last 168 hours.  ABG    Component Value Date/Time    PHART 7.374 03/14/2018 0127   PCO2ART 29.2 (L) 03/14/2018 0127   PO2ART 139 (H) 03/14/2018 0127   HCO3 16.7 (L) 03/14/2018 0127   TCO2 30 10/29/2011 0925   ACIDBASEDEF 7.6 (H) 03/14/2018 0127   O2SAT 98.8 03/14/2018 0127     Coagulation Profile: No results for input(s): INR, PROTIME in the last 168 hours.  Cardiac Enzymes: No results for input(s): CKTOTAL, CKMB, CKMBINDEX, TROPONINI in the last 168 hours.  HbA1C: No results found for: HGBA1C  CBG: No results for input(s): GLUCAP in the last 168 hours.  Review of Systems:   Positive for orthopnea, positive for worsening lower extremity swelling of the feet, no chest pain.  Comprehensive review systems otherwise negative.  Past Medical History  She,  has a past medical history of Anemia, Anxiety, Arthralgia, Blood transfusion (2012; 02/2018), CAD (coronary artery disease), Carotid artery disease ([place]), CHF (congestive heart failure) ([place]) (01/21/2020), Chronic diastolic heart failure ([place]), Chronic lower back pain, COPD (chronic obstructive pulmonary disease) ([place]), Depression, Diuretic-induced hypokalemia (10/08/2018), DVT of lower extremity (deep venous thrombosis) ([place]), Dyspnea, Dysrhythmia, Exertional angina ([place]), GERD (gastroesophageal reflux disease), Gout, HLD (hyperlipidemia), Hypertension, Hyperthyroidism, L Subclavian Artery Stenosis, Obesity (BMI 30-39.9) (2009), Osteoarthritis, Oxygen deficiency, Oxygen dependent, Pneumonia, PVD (peripheral vascular disease) ([place]), and Sleep apnea (2012).   Surgical History    Past Surgical History:  Procedure Laterality Date  . ANKLE SURGERY  2004  . ANTERIOR CERVICAL DECOMP/DISCECTOMY FUSION  11/2007  . AUGMENTATION MAMMAPLASTY    . [place]   lower; another scheduled, opt. reports 4 back- lumbar, 3 cerv. fusions  for later 2009  . CARDIAC CATHETERIZATION  11/2009   Patent LIMA to LAD and patent SVG to OM1. Occluded SVG to ramus and diagonal. Left main: 90% ostial stenosis,  LCX 60-70% proximal stenosis  . CATARACT EXTRACTION W/ INTRAOCULAR LENS  IMPLANT, BILATERAL Bilateral 2006  . COLONOSCOPY WITH PROPOFOL N/A 02/17/2017   Procedure: COLONOSCOPY WITH PROPOFOL;  Surgeon: Jerene Bears, MD;  Location[facility] ENDOSCOPY;  Service: Endoscopy;  Laterality: N/A;  . CORONARY ANGIOPLASTY WITH STENT PLACEMENT  11/2009   Drug eluting stent to left main artery: 4.0 X 12 mm Ion   . CORONARY ARTERY BYPASS GRAFT  2004   "CABG X4"  . ESOPHAGOGASTRODUODENOSCOPY N/A 02/14/2017   Procedure: ESOPHAGOGASTRODUODENOSCOPY (EGD);  Surgeon: Doran Stabler, MD;  Location: [facility] ENDOSCOPY;  Service: Endoscopy;  Laterality: N/A;  . GIVENS CAPSULE STUDY N/A 02/15/2017   Procedure: GIVENS CAPSULE STUDY;  Surgeon: Doran Stabler, MD;  Location: [place];  Service: Endoscopy;  Laterality: N/A;  . GROIN MASS OPEN BIOPSY  2004  . IR IVC FILTER PLMT / S&I /IMG GUID/MOD SED  03/14/2018  . IR PARACENTESIS  03/10/2018  . JOINT REPLACEMENT    . LAPAROSCOPIC CHOLECYSTECTOMY    . PERIPHERAL VASCULAR INTERVENTION Left 03/05/2018   Procedure: PERIPHERAL VASCULAR INTERVENTION;  Surgeon: Elam Dutch, MD;  Location: [place] CV LAB;  Service: Cardiovascular;  Laterality: Left;  Attempted unsuccess\ful Per Dr. Eden Lathe  . PERIPHERAL VASCULAR INTERVENTION  03/08/2018   Procedure: PERIPHERAL VASCULAR INTERVENTION;  Surgeon: Waynetta Sandy, MD;  Location: [place] CV LAB;  Service: Cardiovascular;;  SMA Stent   . REPLACEMENT TOTAL KNEE BILATERAL Bilateral 2012  . REVERSE SHOULDER ARTHROPLASTY  02/26/2012   Procedure: REVERSE SHOULDER ARTHROPLASTY;  Surgeon: Marin Shutter, MD;  Location: [place];  Service: Orthopedics;  Laterality: Right;  RIGHT SHOULDER REVERSED ARTHROPLASTY  . SHOULDER ARTHROSCOPY WITH SUBACROMIAL DECOMPRESSION Left 02/10/2013   Procedure: SHOULDER ARTHROSCOPY WITH SUBACROMIAL DECOMPRESSION DISTAL CLAVICLE RESECTION;  Surgeon: Marin Shutter, MD;  Location: [place];  Service:  Orthopedics;  Laterality: Left;  DISTAL CLAVICLE RESECTION  . TONSILLECTOMY    . TOTAL HIP ARTHROPLASTY  2007   Right  . TOTAL HIP ARTHROPLASTY Left 02/07/2020   Procedure: TOTAL HIP ARTHROPLASTY ANTERIOR APPROACH;  Surgeon: Paralee Cancel, MD;  Location: [facility];  Service: Orthopedics;  Laterality: Left;  70 mins  . TRANSURETHRAL RESECTION OF BLADDER TUMOR N/A 05/31/2018   Procedure: TRANSURETHRAL RESECTION OF BLADDER TUMOR (TURBT);  Surgeon: Lucas Mallow, MD;  Location: [facility];  Service: Urology;  Laterality: N/A;  . VISCERAL ANGIOGRAPHY N/A 03/05/2018   Procedure: VISCERAL ANGIOGRAPHY;  Surgeon: Elam Dutch, MD;  Location: [place] CV LAB;  Service: Cardiovascular;  Laterality: N/A;  . VISCERAL ANGIOGRAPHY N/A 03/08/2018   Procedure: VISCERAL ANGIOGRAPHY;  Surgeon: Waynetta Sandy, MD;  Location: [place] CV LAB;  Service: Cardiovascular;  Laterality: N/A;     Social History   reports that she quit smoking about 19 years ago. Her smoking use included cigarettes. She has a 45.00 pack-year smoking history. She has never used smokeless tobacco. She reports that she does not drink alcohol and does not use drugs.   Family History   Her family history includes COPD in her father; Colitis in her sister; Heart disease in her father and sister; Lung cancer in her father; Ovarian cancer in her mother; Stomach cancer in her sister.   Allergies Allergies  Allergen Reactions  . Ativan [Lorazepam] Other (See Comments)    Confusion   . Pitavastatin Other (See Comments)    LIVALO: reaction not recalled  . Ropinirole Nausea Only and Other (See Comments)    Makes legs jump, also  . Zofran [Ondansetron Hcl] Nausea Only  . Zolpidem Tartrate Anxiety and Other (See Comments)    CONFUSION   . Penicillins Rash and Other (See Comments)    Tolerated cephalosporins in past Has patient had a PCN reaction causing immediate rash, facial/tongue/throat swelling, SOB or lightheadedness with  hypotension: Yes Has patient had a PCN reaction causing severe rash involving mucus membranes or skin necrosis: No Has patient had a PCN reaction that required hospitalization: No Has patient had a PCN reaction occurring within the last 10 years: No If all of the above answers are "NO", then may proceed with Cephalosporin use.      Home Medications  Prior to Admission medications   Medication Sig Start Date End Date Taking? Authorizing Provider  allopurinol (ZYLOPRIM) 100 MG tablet Take 100 mg by mouth daily. 01/30/17  Yes [provider]  ARIPiprazole (ABILIFY) 2 MG tablet Take 2 mg by mouth daily. 12/29/19  Yes [provider]  ascorbic acid (VITAMIN C) 500 MG tablet Take 500 mg by mouth daily.   Yes [provider]  Aspirin-Salicylamide-Caffeine (BC HEADACHE POWDER PO) Take 1 packet by mouth as needed (pain).   Yes [provider]  atorvastatin (LIPITOR) 20 MG tablet Take 20 mg by mouth daily. 09/21/20  Yes [provider]  citalopram (CELEXA) 40 MG tablet Take 40 mg by mouth at bedtime. 08/30/19  Yes [provider]  cyclobenzaprine (FLEXERIL) 10 MG tablet Take 10 mg by mouth 3 (three) times daily as needed for muscle spasms.    Yes [provider]  ferrous sulfate (FERROUSUL) 325 (65 FE) MG tablet Take 1 tablet (325 mg total) by mouth 3 (three) times daily with meals for 14 days. Patient taking differently: Take 325 mg by mouth daily.  02/08/20 10/09/20 Yes Babish, Rodman Key, PA-C  furosemide (LASIX) 40 MG tablet Take two tablets by mouth every Monday and Thursday afternoon.  Take one tablet all other afternoons. Patient taking differently: Take 40-80 mg by mouth See admin instructions. Take 80mg  every Monday and Thursday afternoon, and 40mg  on all other afternoons. 03/13/20  Yes Fay Records, MD  gabapentin (NEURONTIN) 100 MG capsule Take 100 mg by mouth daily. 01/09/20  Yes [provider]  guaiFENesin (MUCINEX) 600 MG 12 hr  tablet Take 600 mg by mouth 2 (two) times daily.    Yes [provider]  INCRUSE ELLIPTA 62.5 MCG/INH AEPB Inhale 1 puff into the lungs 2 (two) times daily.  11/30/16  Yes [provider]  Ipratropium-Albuterol (COMBIVENT) 20-100 MCG/ACT AERS respimat Inhale 1 puff into the lungs every 6 (six) hours as needed for wheezing or shortness of breath (or to help expel mucous).    Yes [provider]  levothyroxine (SYNTHROID) 50 MCG tablet Take 1 tablet (50 mcg total) by mouth daily at 6 (six) AM. 09/13/20  Yes Fay Records, MD  lisinopril (ZESTRIL) 2.5 MG tablet Take 2.5 mg by mouth daily. 09/25/20  Yes [provider]  magnesium oxide (MAG-OX) 400 (241.3 Mg) MG tablet Take 1 tablet (400 mg total) by mouth 2 (two) times daily. 03/26/18  Yes Mikhail, Velta Addison, DO  Melatonin 10 MG TABS Take 10 mg by mouth at bedtime.    Yes [provider]  metolazone (ZAROXOLYN) 2.5 MG tablet Take 2.5 mg by mouth at bedtime.  08/22/20  Yes [provider]  neomycin-polymyxin-hydrocortisone (CORTISPORIN) 3.5-10000-1 OTIC suspension Place 5 drops into the right ear daily as needed (ear pain).  08/31/19  Yes [provider]  nitroGLYCERIN (NITROSTAT) 0.4 MG SL tablet Place 1 tablet (0.4 mg total) under the tongue every 5 (five) minutes as needed for chest pain. For chest pain 04/24/16  Yes Fay Records, MD  ofloxacin (FLOXIN) 0.3 % OTIC solution Place 2 drops into the left ear daily.  04/13/20  Yes [provider]  omeprazole (PRILOSEC) 40 MG capsule Take 1 capsule (40 mg total) by mouth 2 (two) times daily. Patient taking differently: Take 40 mg by mouth every evening.  12/19/19  Yes Esterwood, Amy S, PA-C  OXYGEN Inhale 4 L/min into the lungs as needed (DURING ALL TIMES OF EXERTION).    Yes [provider]  pilocarpine (PILOCAR) 1 % ophthalmic solution Place 1 drop into both  eyes 2 (two) times daily.   Yes [provider]  polyethylene glycol  (MIRALAX / GLYCOLAX) 17 g packet Take 17 g by mouth 2 (two) times daily. Patient taking differently: Take 17 g by mouth daily.  02/08/20  Yes Babish, Rodman Key, PA-C  potassium chloride SA (KLOR-CON) 20 MEQ tablet Take 20 mEq by mouth daily. 10/01/20  Yes [provider]  pramipexole (MIRAPEX) 0.25 MG tablet Take 0.25 mg by mouth every evening.  08/22/20  Yes [provider]  roflumilast (DALIRESP) 500 MCG TABS tablet Take 500 mcg by mouth every evening.    Yes [provider]  SPIRIVA RESPIMAT 1.25 MCG/ACT AERS Inhale 2 puffs into the lungs daily.  08/24/20  Yes [provider]  spironolactone (ALDACTONE) 25 MG tablet TAKE 1 TABLET BY MOUTH DAILY Patient taking differently: Take 25 mg by mouth daily.  11/25/19  Yes Fay Records, MD  traZODone (DESYREL) 50 MG tablet Take 150 mg by mouth at bedtime.  10/07/18  Yes [provider]  XTAMPZA ER 9 MG C12A Take 9 mg by mouth every 8 (eight) hours. 09/25/20  Yes [provider]  acetaminophen (TYLENOL) 500 MG tablet Take 2 tablets (1,000 mg total) by mouth every 8 (eight) hours. Patient not taking: Reported on 10/09/2020 02/08/20   Danae Orleans, PA-C  docusate sodium (COLACE) 100 MG capsule Take 1 capsule (100 mg total) by mouth 2 (two) times daily. Patient not taking: Reported on 10/09/2020 02/08/20   Danae Orleans, PA-C  oxyCODONE 10 MG TABS Take 1-2 tablets (10-20 mg total) by mouth every 4 (four) hours as needed for moderate pain or severe pain. Patient not taking: Reported on 10/09/2020 02/08/20   Danae Orleans, PA-C  potassium chloride (KLOR-CON) 10 MEQ tablet Take 2 tablets (20 mEq total) by mouth daily. Take 2 additional tablets every Monday and Thursday. Patient not taking: Reported on 10/09/2020 03/13/20   Fay Records, MD  rivaroxaban (XARELTO) 10 MG TABS tablet Take 1 tablet (10 mg total) by mouth daily for 14 days. Patient not taking: Reported on 10/09/2020 02/09/20 10/09/20  Danae Orleans,  PA-C     Critical care time:      CRITICAL CARE Performed by: Lanier Clam   Total critical care time: 35 minutes  Critical care time was exclusive of separately billable procedures and treating other patients.  Critical care was necessary to treat or prevent imminent or life-threatening deterioration.  Critical care was time spent personally by me on the following activities: development of treatment plan with patient and/or surrogate as well as nursing, discussions with consultants, evaluation of patient's response to treatment, examination of patient, obtaining history from patient or surrogate, ordering and performing treatments and interventions, ordering and review of laboratory studies, ordering and review of radiographic studies, pulse oximetry and re-evaluation of patient's condition.

## 2020-10-11 NOTE — Plan of Care (Signed)
NOtifeid By Dr. Silas Flood of pCCM that patient will be stable for transfer to HOspitalist team in AM. Dr. Florene Glen is also aware.  7:48 PM [name]

## 2020-10-11 NOTE — Progress Notes (Signed)
Progress Note  Patient Name: [name] Date of Encounter: 10/11/2020  [facility] Cardiologist: Dorris Carnes, MD   Subjective  No events overnight. Continues to have significant chest pain with coughing. Awaiting V/Q scan.  Cr improving to 1.42. Off phenylephrine with SBP 120s at bedside.  Inpatient Medications    Scheduled Meds: . allopurinol  100 mg Oral Daily  . arformoterol  15 mcg Nebulization BID  . ARIPiprazole  2 mg Oral Daily  . ascorbic acid  500 mg Oral Daily  . aspirin  81 mg Oral Daily  . atorvastatin  10 mg Oral Daily  . budesonide (PULMICORT) nebulizer solution  0.5 mg Nebulization BID  . chlorhexidine  15 mL Mouth Rinse BID  . Chlorhexidine Gluconate Cloth  6 each Topical Daily  . citalopram  40 mg Oral QHS  . doxycycline  100 mg Oral Q12H  . ferrous sulfate  325 mg Oral Q breakfast  . gabapentin  100 mg Oral Daily  . ipratropium-albuterol  3 mL Nebulization TID  . levothyroxine  50 mcg Oral Q0600  . magnesium oxide  400 mg Oral BID  . mouth rinse  15 mL Mouth Rinse q12n4p  . melatonin  10 mg Oral QHS  . pantoprazole  40 mg Oral QHS  . pilocarpine  1 drop Both Eyes BID  . polyethylene glycol  17 g Oral Daily  . pramipexole  0.25 mg Oral QPM  . predniSONE  40 mg Oral Q breakfast  . roflumilast  500 mcg Oral QPM  . sodium chloride flush  3 mL Intravenous Q12H  . traZODone  150 mg Oral QHS   Continuous Infusions: . sodium chloride Stopped (10/11/20 0639)  . cefTRIAXone (ROCEPHIN)  IV Stopped (10/10/20 1722)  . heparin 1,500 Units/hr (10/11/20 0700)  . phenylephrine (NEO-SYNEPHRINE) Adult infusion 10 mcg/min (10/11/20 0917)   PRN Meds: acetaminophen **OR** acetaminophen, albuterol, benzonatate, guaiFENesin, morphine injection, nitroGLYCERIN   Vital Signs    Vitals:   10/11/20 0848 10/11/20 0850 10/11/20 0852 10/11/20 0900  BP:    (!) 141/55  Pulse:    92  Resp:    (!) 23  Temp:      TempSrc:      SpO2: 94% 96% 98% 98%  Weight:        Height:        Intake/Output Summary (Last 24 hours) at 10/11/2020 1059 Last data filed at 10/11/2020 0700 Gross per 24 hour  Intake 3161.49 ml  Output 2900 ml  Net 261.49 ml   Last 3 Weights 10/11/2020 10/10/2020 10/09/2020  Weight (lbs) 179 lb 3.7 oz 166 lb 0.1 oz 167 lb 8.8 oz  Weight (kg) 81.3 kg 75.3 kg 76 kg      Telemetry    NSR with occasional PVCs - Personally Reviewed  ECG    No new tracings- Personally Reviewed  Physical Exam   GEN: Sitting up in bed, uncomfortable while coughing Neck: + JVD Cardiac: RRR, no murmurs, rubs, or gallops.  Respiratory: Clear without wheezes or rhonchi GI: Soft, nontender, non-distended  MS: Warm, trace edema Neuro:  Nonfocal  Psych: Normal affect   Labs    High Sensitivity Troponin:   Recent Labs  Lab 10/09/20 1141 10/09/20 1355 10/10/20 0829  TROPONINIHS 36* 41* 47*      Chemistry Recent Labs  Lab 10/09/20 1141 10/10/20 0245 10/11/20 0300  NA 138 133* 137  K 4.1 3.8 4.0  CL 97* 97* 102  CO2 28 24 25  GLUCOSE 117* 197* 139*  BUN 46* 52* 47*  CREATININE 1.97* 2.19* 1.42*  CALCIUM 9.2 8.2* 8.6*  PROT  --   --  6.2*  ALBUMIN  --   --  2.4*  AST  --   --  43*  ALT  --   --  30  ALKPHOS  --   --  74  BILITOT  --   --  0.5  GFRNONAA 23* 21* 35*  ANIONGAP 13 12 10      Hematology Recent Labs  Lab 10/09/20 1141 10/10/20 0245 10/11/20 0300  WBC 18.0* 16.4* 36.9*  RBC 3.57* 3.08* 3.60*  HGB 10.6* 9.2* 10.5*  HCT 33.5* 29.0* 33.0*  MCV 93.8 94.2 91.7  MCH 29.7 29.9 29.2  MCHC 31.6 31.7 31.8  RDW 15.1 15.3 15.4  PLT 280 249 405*    BNP Recent Labs  Lab 10/09/20 1141  BNP 491.4*     DDimer  Recent Labs  Lab 10/09/20 1141  DDIMER 8.76*     Radiology    DG CHEST PORT 1 VIEW  Result Date: 10/10/2020 CLINICAL DATA:  Dyspnea. EXAM: PORTABLE CHEST 1 VIEW COMPARISON:  October 09, 2020. FINDINGS: Stable cardiomediastinal silhouette. Status post coronary bypass graft. No pneumothorax or  pleural effusion is noted. No acute pulmonary disease is noted. Status post right shoulder arthroplasty. IMPRESSION: No active disease. Aortic Atherosclerosis (ICD10-I70.0). Electronically Signed   By: Marijo Conception M.D.   On: 10/10/2020 10:13   DG Chest Port 1 View  Result Date: 10/09/2020 CLINICAL DATA:  Shortness of breath, cough and fever. EXAM: PORTABLE CHEST 1 VIEW COMPARISON:  10/05/2019 FINDINGS: Previous median sternotomy and CABG. Mild cardiomegaly. Aortic atherosclerosis. Bilateral breast implants with density overlying the chest. Suspicion of bronchitis and hazy pneumonia in the mid and lower lungs. No dense consolidation, lobar collapse or effusion. Old shoulder replacement on the right. Chronic degenerative changes of the left shoulder. IMPRESSION: 1. Suspicion of bronchitis and hazy pneumonia in the mid and lower lungs. No dense consolidation or lobar collapse. 2. Previous CABG. Mild cardiomegaly. Aortic atherosclerosis. Electronically Signed   By: Nelson Chimes M.D.   On: 10/09/2020 13:15   ECHOCARDIOGRAM COMPLETE  Result Date: 10/09/2020    ECHOCARDIOGRAM REPORT   Patient Name:   [name] Date of Exam: 10/09/2020 Medical Rec #:  [id number]      Height:       68.0 in Accession #:    [id number]     Weight:       167.5 lb Date of Birth:  1941/02/15       BSA:          1.896 m Patient Age:    79 years       BP:           92/42 mmHg Patient Gender: F              HR:           88 bpm. Exam Location:  Inpatient Procedure: Cardiac Doppler, Color Doppler, 2D Echo and 3D Echo STAT ECHO Indications:    R07.9* Chest pain, unspecified  History:        Patient has prior history of Echocardiogram examinations, most                 recent 10/05/2019. CAD, Abnormal ECG and Prior CABG, COPD,                 Signs/Symptoms:Dyspnea; Risk Factors:Sleep Apnea and  Dyslipidemia. Hypoxia. Pulmonary embolus.  Sonographer:    Roseanna Rainbow RDCS Referring Phys: [id number] [place]  1. Diastology suggestive of impaired LV filling. Left ventricular ejection fraction, by estimation, is 60 to 65%. The left ventricle has normal function. The left ventricle has no regional wall motion abnormalities. There is moderate concentric left ventricular hypertrophy. Left ventricular diastolic parameters are indeterminate.  2. Right ventricular systolic function is low normal. The right ventricular size is normal. There is normal pulmonary artery systolic pressure.  3. Left atrial size was moderately dilated.  4. Right atrial size was moderately dilated.  5. The mitral valve is myxomatous. No evidence of mitral valve regurgitation. The mean mitral valve gradient is 5.0 mmHg with average heart rate of 87 bpm. Moderate mitral annular calcification.  6. The aortic valve is tricuspid. Aortic valve regurgitation is not visualized.  7. The inferior vena cava is dilated in size with <50% respiratory variability, suggesting right atrial pressure of 15 mmHg. Comparison(s): A prior study was performed on 10/05/2019. No significant change from prior study. Subtle decrease in RV function. FINDINGS  Left Ventricle: Diastology suggestive of impaired LV filling. Left ventricular ejection fraction, by estimation, is 60 to 65%. The left ventricle has normal function. The left ventricle has no regional wall motion abnormalities. The left ventricular internal cavity size was normal in size. There is moderate concentric left ventricular hypertrophy. Left ventricular diastolic parameters are indeterminate. Right Ventricle: The right ventricular size is normal. Right vetricular wall thickness was not well visualized. Right ventricular systolic function is low normal. There is normal pulmonary artery systolic pressure. The tricuspid regurgitant velocity is 2.30 m/s, and with an assumed right atrial pressure of 3 mmHg, the estimated right ventricular systolic pressure is 94.5 mmHg. Left Atrium: Left atrial size was  moderately dilated. Right Atrium: Right atrial size was moderately dilated. Pericardium: There is no evidence of pericardial effusion. Mitral Valve: The mitral valve is myxomatous. There is mild calcification of the mitral valve leaflet(s). Moderate mitral annular calcification. No evidence of mitral valve regurgitation. MV peak gradient, 25.4 mmHg. The mean mitral valve gradient is 5.0  mmHg with average heart rate of 87 bpm. Tricuspid Valve: The tricuspid valve is grossly normal. Tricuspid valve regurgitation is trivial. Aortic Valve: The aortic valve is tricuspid. Aortic valve regurgitation is not visualized. Pulmonic Valve: The pulmonic valve was not well visualized. Pulmonic valve regurgitation is trivial. Aorta: The aortic root and ascending aorta are structurally normal, with no evidence of dilitation. Venous: The inferior vena cava is dilated in size with less than 50% respiratory variability, suggesting right atrial pressure of 15 mmHg. IAS/Shunts: The interatrial septum appears to be lipomatous. The atrial septum is grossly normal.  LEFT VENTRICLE PLAX 2D LVIDd:         4.60 cm     Diastology LVIDs:         3.00 cm     LV e' medial:    5.87 cm/s LV PW:         1.40 cm     LV E/e' medial:  27.1 LV IVS:        1.40 cm     LV e' lateral:   8.21 cm/s LVOT diam:     1.70 cm     LV E/e' lateral: 19.4 LV SV:         62 LV SV Index:   33 LVOT Area:     2.27 cm  LV Volumes (MOD) LV vol  d, MOD A2C: 99.9 ml LV vol d, MOD A4C: 71.4 ml LV vol s, MOD A2C: 35.9 ml LV vol s, MOD A4C: 26.8 ml LV SV MOD A2C:     64.0 ml LV SV MOD A4C:     71.4 ml LV SV MOD BP:      56.6 ml RIGHT VENTRICLE            IVC RV S prime:     8.08 cm/s  IVC diam: 3.00 cm TAPSE (M-mode): 1.4 cm LEFT ATRIUM             Index       RIGHT ATRIUM           Index LA diam:        3.80 cm 2.00 cm/m  RA Area:     17.30 cm LA Vol (A2C):   56.3 ml 29.70 ml/m RA Volume:   44.90 ml  23.69 ml/m LA Vol (A4C):   44.9 ml 23.69 ml/m LA Biplane Vol: 55.6 ml  29.33 ml/m  AORTIC VALVE LVOT Vmax:   129.00 cm/s LVOT Vmean:  86.900 cm/s LVOT VTI:    0.272 m  AORTA Ao Root diam: 3.40 cm Ao Asc diam:  3.40 cm MITRAL VALVE                TRICUSPID VALVE MV Area (PHT): 4.15 cm     TR Peak grad:   21.2 mmHg MV Peak grad:  25.4 mmHg    TR Vmax:        230.00 cm/s MV Mean grad:  5.0 mmHg MV Vmax:       2.52 m/s     SHUNTS MV Vmean:      88.8 cm/s    Systemic VTI:  0.27 m MV Decel Time: 183 msec     Systemic Diam: 1.70 cm MV E velocity: 159.00 cm/s MV A velocity: 209.00 cm/s MV E/A ratio:  0.76 Rudean Haskell MD Electronically signed by Rudean Haskell MD Signature Date/Time: 10/09/2020/4:38:55 PM    Final    VAS [place] LOWER EXTREMITY VENOUS (DVT)  Result Date: 10/10/2020  Lower Venous DVTStudy Indications: Elevated ddimer.  Comparison Study: previous 11/03/18 Performing Technologist: Abram Sander RVS  Examination Guidelines: A complete evaluation includes B-mode imaging, spectral Doppler, color Doppler, and power Doppler as needed of all accessible portions of each vessel. Bilateral testing is considered an integral part of a complete examination. Limited examinations for reoccurring indications may be performed as noted. The reflux portion of the exam is performed with the patient in reverse Trendelenburg.  +---------+---------------+---------+-----------+----------+--------------+ RIGHT    CompressibilityPhasicitySpontaneityPropertiesThrombus Aging +---------+---------------+---------+-----------+----------+--------------+ CFV      Full           Yes      Yes                                 +---------+---------------+---------+-----------+----------+--------------+ SFJ      Full                                                        +---------+---------------+---------+-----------+----------+--------------+ FV Prox  Full                                                         +---------+---------------+---------+-----------+----------+--------------+  FV Mid   Full                                                        +---------+---------------+---------+-----------+----------+--------------+ FV DistalFull                                                        +---------+---------------+---------+-----------+----------+--------------+ PFV      Full                                                        +---------+---------------+---------+-----------+----------+--------------+ POP      None           Yes      Yes                  Chronic        +---------+---------------+---------+-----------+----------+--------------+ PTV      Full                                                        +---------+---------------+---------+-----------+----------+--------------+ PERO     Full                                                        +---------+---------------+---------+-----------+----------+--------------+   +---------+---------------+---------+-----------+----------+--------------+ LEFT     CompressibilityPhasicitySpontaneityPropertiesThrombus Aging +---------+---------------+---------+-----------+----------+--------------+ CFV      Full           Yes      Yes                                 +---------+---------------+---------+-----------+----------+--------------+ SFJ      Full                                                        +---------+---------------+---------+-----------+----------+--------------+ FV Prox  Full                                                        +---------+---------------+---------+-----------+----------+--------------+ FV Mid   Full                                                        +---------+---------------+---------+-----------+----------+--------------+  FV DistalFull                                                         +---------+---------------+---------+-----------+----------+--------------+ PFV      Full                                                        +---------+---------------+---------+-----------+----------+--------------+ POP      Full           Yes      Yes                                 +---------+---------------+---------+-----------+----------+--------------+ PTV      Full                                                        +---------+---------------+---------+-----------+----------+--------------+ PERO     Full                                                        +---------+---------------+---------+-----------+----------+--------------+     Summary: RIGHT: - Findings consistent with chronic deep vein thrombosis involving the right popliteal vein. - No cystic structure found in the popliteal fossa.  LEFT: - There is no evidence of deep vein thrombosis in the lower extremity.  - No cystic structure found in the popliteal fossa.  *See table(s) above for measurements and observations. Electronically signed by Harold Barban MD on 10/10/2020 at 7:40:41 PM.    Final    VAS [place] UPPER EXTREMITY VENOUS DUPLEX  Result Date: 10/10/2020 UPPER VENOUS STUDY  Indications: elevated ddimer Comparison Study: no prior Performing Technologist: Abram Sander RVS  Examination Guidelines: A complete evaluation includes B-mode imaging, spectral Doppler, color Doppler, and power Doppler as needed of all accessible portions of each vessel. Bilateral testing is considered an integral part of a complete examination. Limited examinations for reoccurring indications may be performed as noted.  Right Findings: +----------+------------+---------+-----------+----------+-------+ RIGHT     CompressiblePhasicitySpontaneousPropertiesSummary +----------+------------+---------+-----------+----------+-------+ IJV           Full       Yes       Yes                       +----------+------------+---------+-----------+----------+-------+ Subclavian    Full       Yes       Yes                      +----------+------------+---------+-----------+----------+-------+ Axillary      Full       Yes       Yes                      +----------+------------+---------+-----------+----------+-------+ Brachial  Full       Yes       Yes                      +----------+------------+---------+-----------+----------+-------+ Radial        Full                                          +----------+------------+---------+-----------+----------+-------+ Ulnar         Full                                          +----------+------------+---------+-----------+----------+-------+ Cephalic      Full                                          +----------+------------+---------+-----------+----------+-------+ Basilic       Full                                          +----------+------------+---------+-----------+----------+-------+  Left Findings: +----------+------------+---------+-----------+----------+-------+ LEFT      CompressiblePhasicitySpontaneousPropertiesSummary +----------+------------+---------+-----------+----------+-------+ IJV           Full       Yes       Yes                      +----------+------------+---------+-----------+----------+-------+ Subclavian    Full       Yes       Yes                      +----------+------------+---------+-----------+----------+-------+ Axillary      Full       Yes       Yes                      +----------+------------+---------+-----------+----------+-------+ Brachial      Full       Yes       Yes                      +----------+------------+---------+-----------+----------+-------+ Radial        Full                                          +----------+------------+---------+-----------+----------+-------+ Ulnar         Full                                           +----------+------------+---------+-----------+----------+-------+ Cephalic      Full                                          +----------+------------+---------+-----------+----------+-------+ Basilic       Full                                          +----------+------------+---------+-----------+----------+-------+  Summary: No evidence of deep vein or superficial vein thrombosis involving the right and left upper extremities.  *See table(s) above for measurements and observations.  Diagnosing physician: Harold Barban MD Electronically signed by Harold Barban MD on 10/10/2020 at 7:40:28 PM.    Final     Cardiac Studies  Echocardiogram 10/09/2020: Impressions: 1. Diastology suggestive of impaired LV filling. Left ventricular  ejection fraction, by estimation, is 60 to 65%. The left ventricle has  normal function. The left ventricle has no regional wall motion  abnormalities. There is moderate concentric left  ventricular hypertrophy. Left ventricular diastolic parameters are  indeterminate.  2. Right ventricular systolic function is low normal. The right  ventricular size is normal. There is normal pulmonary artery systolic  pressure.  3. Left atrial size was moderately dilated.  4. Right atrial size was moderately dilated.  5. The mitral valve is myxomatous. No evidence of mitral valve  regurgitation. The mean mitral valve gradient is 5.0 mmHg with average  heart rate of 87 bpm. Moderate mitral annular calcification.  6. The aortic valve is tricuspid. Aortic valve regurgitation is not  visualized.  7. The inferior vena cava is dilated in size with <50% respiratory  variability, suggesting right atrial pressure of 15 mmHg.   Comparison(s): A prior study was performed on 10/05/2019. No significant  change from prior study. Subtle decrease in RV function.    Patient Profile     79 y.o. female with a history of CAD s/p CABG in 2004 with subsequent DES to  left main in 2094, chronic diastolic CHF, carotid artery disease, PAD s/p SMA stenting in 2019, recurrent DVT/PE s/p IVC filter in3/2019not on anticoagulation due to GI bleed and prior large abdominal wall hematoma, COPD on home O2, cirrhosis, hypertension, hyperlipidemia, hyperthyroidism, and depression who presented with worsening SOB, fevers and chest pain found to have COPD exacerbation, pneumonia and volume overload.  Assessment & Plan   Acute on Chronic Hypoxic Respiratory Failure  Community Acquired Pneumonia COPD on 4L of O2 at Home Shortness of breath Improving with antibiotics and steroids. Continues to have chest pain with coughing. -Continue ABX and steroids per primary team -Continue supplemental oxygen -Await V/Q scan for PE -Continue heparin gtt -Renal function improving; will do trial of diuresis tomorrow pending continued improvement  Acute on Chronic Diastolic CHF Volume up on exam exam but unlikely to be primary driver of hypoxia.  -Given improvement of renal function; will do a trial of diuresis tomorrow  -Off pressors -Monitor I/Os and daily weights -Low Na diet  Chest Pain CAD s/p CABG in2004 with Subsequent DES toLeft main in 2010 Demand Ischemia Low suspicion ACS. -Continue statin -Continue ASA  -On heparin gtt until r/o PE  Recurrent PE/DVT s/p IVC Filter History of recurrent PE/DVT/ S/p IVC filter in 02/2018. Not on anticoagulation due to GI bleed and prior large abdominal wall hematoma. D-dimer elevated on admission -Follow-up V/Q scan  Hypotension Improving. -Continue phenylephrine per primary team  Acute on Chronic Renal Function Improving. -Holding diuresis for now; will do trial tomorrow -Continue pressor support as above -Monitor I/Os and renally dose medications     For questions or updates, please contact [facility] Please consult [email or web address] for contact info under        Signed, Freada Bergeron, MD  10/11/2020,  10:59 AM

## 2020-10-12 DIAGNOSIS — J9621 Acute and chronic respiratory failure with hypoxia: Secondary | ICD-10-CM | POA: Diagnosis not present

## 2020-10-12 LAB — CBC
HCT: 35 % — ABNORMAL LOW (ref 36.0–46.0)
Hemoglobin: 10.8 g/dL — ABNORMAL LOW (ref 12.0–15.0)
MCH: 29.3 pg (ref 26.0–34.0)
MCHC: 30.9 g/dL (ref 30.0–36.0)
MCV: 94.9 fL (ref 80.0–100.0)
Platelets: 324 10*3/uL (ref 150–400)
RBC: 3.69 MIL/uL — ABNORMAL LOW (ref 3.87–5.11)
RDW: 15.6 % — ABNORMAL HIGH (ref 11.5–15.5)
WBC: 17.1 10*3/uL — ABNORMAL HIGH (ref 4.0–10.5)
nRBC: 0 % (ref 0.0–0.2)

## 2020-10-12 LAB — URINE CULTURE: Culture: <10000 — AB

## 2020-10-12 MED ORDER — IPRATROPIUM-ALBUTEROL 0.5-2.5 (3) MG/3ML IN SOLN
3.0000 mL | Freq: Two times a day (BID) | RESPIRATORY_TRACT | Status: DC
  Administered 2020-10-12 – 2020-10-13 (×2): 3 mL via RESPIRATORY_TRACT
  Filled 2020-10-12 (×2): qty 3

## 2020-10-12 MED ORDER — FUROSEMIDE 10 MG/ML IJ SOLN
40.0000 mg | Freq: Once | INTRAMUSCULAR | Status: AC
  Administered 2020-10-12: 40 mg via INTRAVENOUS
  Filled 2020-10-12: qty 4

## 2020-10-12 NOTE — Evaluation (Signed)
Physical Therapy Evaluation Patient Details Name: [name] MRN: [id number] DOB: 06/25/1941 Today's Date: 10/12/2020   History of Present Illness  Pt is an 79 year old female admitted for acute on chronic hypoxic respiratory failure: likely multifactorial in the setting of community acquired PNA, COPD on 4L O2 at home, and acute on chronic diastolic CHF  Clinical Impression  Pt admitted with above diagnosis. Pt currently with functional limitations due to the deficits listed below (see PT Problem List). Pt will benefit from skilled PT to increase their independence and safety with mobility to allow discharge to the venue listed below.  Pt reports she is mostly a household ambulator and uses cane.  Pt on 4L O2 [place] at baseline.  Pt reports she wishes to return home upon d/c and feels she will be able to function.  Would recommend 24/7 assist upon d/c as pt very fatigued and reports dyspnea with only using BSC today.  Pt pleasant and attempting to perform mobility to the best of her ability.  Pt reports current dyspnea 3/4 with activity is not her baseline.     Follow Up Recommendations Home health PT;Supervision/Assistance - 24 hour    Equipment Recommendations  None recommended by PT    Recommendations for Other Services       Precautions / Restrictions Precautions Precautions: Fall Precaution Comments: chronic 4L O2 [place]      Mobility  Bed Mobility Overal bed mobility: Needs Assistance Bed Mobility: Supine to Sit;Sit to Supine     Supine to sit: Min assist Sit to supine: Min assist   General bed mobility comments: slight assist for trunk upright, assist for LEs onto bed    Transfers Overall transfer level: Needs assistance   Transfers: Sit to/from Stand;Stand Pivot Transfers Sit to Stand: Min guard Stand pivot transfers: Min guard       General transfer comment: provided RW however pt just use armrest of BSC, pt had bowel urgency, dyspnea and fatigue after hygiene so  returned to bed  Ambulation/Gait                Stairs            Wheelchair Mobility    Modified Rankin (Stroke Patients Only)       Balance                                             Pertinent Vitals/Pain Pain Assessment: No/denies pain    Home Living Family/patient expects to be discharged to:: Private residence Living Arrangements: Alone Available Help at Discharge: Family;Available PRN/intermittently Type of Home: House Home Access: Stairs to enter Entrance Stairs-Rails: Right Entrance Stairs-Number of Steps: 2 Home Layout: One level Home Equipment: Walker - 2 wheels;Cane - single point;Wheelchair - manual;Shower seat;Bedside Editor, commissioning      Prior Function Level of Independence: Independent with assistive device(s)         Comments: pt reports using cane at home and scoot for community - loves going to church     Hand Dominance        Extremity/Trunk Assessment        Lower Extremity Assessment Lower Extremity Assessment: Generalized weakness       Communication   Communication: HOH  Cognition Arousal/Alertness: Awake/alert Behavior During Therapy: WFL for tasks assessed/performed Overall Cognitive Status: Within Functional Limits for tasks assessed  General Comments      Exercises     Assessment/Plan    PT Assessment Patient needs continued PT services  PT Problem List Decreased strength;Cardiopulmonary status limiting activity;Decreased activity tolerance;Decreased balance;Decreased knowledge of use of DME;Decreased mobility       PT Treatment Interventions DME instruction;Gait training;Therapeutic exercise;Balance training;Functional mobility training;Therapeutic activities;Patient/family education    PT Goals (Current goals can be found in the Care Plan section)  Acute Rehab PT Goals PT Goal Formulation: With patient Time For  Goal Achievement: 10/26/20 Potential to Achieve Goals: Good    Frequency Min 3X/week   Barriers to discharge        Co-evaluation               AM-PAC PT "6 Clicks" Mobility  Outcome Measure Help needed turning from your back to your side while in a flat bed without using bedrails?: A Little Help needed moving from lying on your back to sitting on the side of a flat bed without using bedrails?: A Little Help needed moving to and from a bed to a chair (including a wheelchair)?: A Little Help needed standing up from a chair using your arms (e.g., wheelchair or bedside chair)?: A Little Help needed to walk in hospital room?: A Little Help needed climbing 3-5 steps with a railing? : A Lot 6 Click Score: 17    End of Session Equipment Utilized During Treatment: Oxygen Activity Tolerance: Patient limited by fatigue Patient left: in bed;with call bell/phone within reach;with bed alarm set Nurse Communication: Mobility status PT Visit Diagnosis: Difficulty in walking, not elsewhere classified (R26.2)    Time: 0973-5329 PT Time Calculation (min) (ACUTE ONLY): 38 min   Charges:   PT Evaluation $PT Eval Low Complexity: 1 Low PT Treatments $Therapeutic Activity: 8-22 mins      Jannette Spanner PT, DPT Acute Rehabilitation Services Pager: [phone] Office: [phone]  York Ram E 10/12/2020, 3:37 PM

## 2020-10-12 NOTE — Progress Notes (Addendum)
Progress Note  Patient Name: [name] Date of Encounter: 10/12/2020  Primary Cardiologist: Dorris Carnes, MD   Subjective   She reports improvement in her breathing. No complaints of chest pain or palpitations.   Inpatient Medications    Scheduled Meds:  allopurinol  100 mg Oral Daily   arformoterol  15 mcg Nebulization BID   ARIPiprazole  2 mg Oral Daily   ascorbic acid  500 mg Oral Daily   aspirin  81 mg Oral Daily   atorvastatin  10 mg Oral Daily   budesonide (PULMICORT) nebulizer solution  0.5 mg Nebulization BID   chlorhexidine  15 mL Mouth Rinse BID   Chlorhexidine Gluconate Cloth  6 each Topical Daily   citalopram  40 mg Oral QHS   doxycycline  100 mg Oral Q12H   enoxaparin (LOVENOX) injection  40 mg Subcutaneous Q24H   ferrous sulfate  325 mg Oral Q breakfast   gabapentin  100 mg Oral Daily   ipratropium-albuterol  3 mL Nebulization BID   levothyroxine  50 mcg Oral Q0600   magnesium oxide  400 mg Oral BID   mouth rinse  15 mL Mouth Rinse q12n4p   melatonin  10 mg Oral QHS   pantoprazole  40 mg Oral QHS   pilocarpine  1 drop Both Eyes BID   polyethylene glycol  17 g Oral Daily   pramipexole  0.25 mg Oral QPM   predniSONE  40 mg Oral Q breakfast   roflumilast  500 mcg Oral QPM   sodium chloride flush  3 mL Intravenous Q12H   traZODone  150 mg Oral QHS   Continuous Infusions:  sodium chloride Stopped (10/11/20 0639)   cefTRIAXone (ROCEPHIN)  IV Stopped (10/11/20 1734)   phenylephrine (NEO-SYNEPHRINE) Adult infusion Stopped (10/11/20 1004)   PRN Meds: acetaminophen **OR** acetaminophen, albuterol, benzonatate, guaiFENesin, morphine injection, nitroGLYCERIN   Vital Signs    Vitals:   10/12/20 0439 10/12/20 0500 10/12/20 0745 10/12/20 0754  BP: (!) 114/55     Pulse: 92     Resp: 18     Temp: 98.6 F (37 C)     TempSrc:      SpO2: 100%  97% 97%  Weight:  80.7 kg    Height:        Intake/Output Summary (Last 24 hours) at 10/12/2020 0834 Last  data filed at 10/11/2020 1800 Gross per 24 hour  Intake 769.2 ml  Output 1000 ml  Net -230.8 ml   Filed Weights   10/10/20 0219 10/11/20 0406 10/12/20 0500  Weight: 75.3 kg 81.3 kg 80.7 kg    Telemetry    Sinus rhythm with intermittent sinus tachycardia with occasional PVCs/PACs - Personally Reviewed  ECG    No new tracings - Personally Reviewed  Physical Exam   GEN: No acute distress.   Neck: No JVD, no carotid bruits Cardiac: RRR, no murmurs, rubs, or gallops.  Respiratory: scattered crackles without overt wheezing/rhonchi GI: NABS, Soft, nontender, non-distended  MS: trace LE edema; No deformity. Neuro:  Nonfocal, moving all extremities spontaneously Psych: Normal affect   Labs    Chemistry Recent Labs  Lab 10/09/20 1141 10/10/20 0245 10/11/20 0300  NA 138 133* 137  K 4.1 3.8 4.0  CL 97* 97* 102  CO2 28 24 25   GLUCOSE 117* 197* 139*  BUN 46* 52* 47*  CREATININE 1.97* 2.19* 1.42*  CALCIUM 9.2 8.2* 8.6*  PROT  --   --  6.2*  ALBUMIN  --   --  2.4*  AST  --   --  43*  ALT  --   --  30  ALKPHOS  --   --  74  BILITOT  --   --  0.5  GFRNONAA 23* 21* 35*  ANIONGAP 13 12 10      Hematology Recent Labs  Lab 10/10/20 0245 10/11/20 0300 10/12/20 0341  WBC 16.4* 36.9* 17.1*  RBC 3.08* 3.60* 3.69*  HGB 9.2* 10.5* 10.8*  HCT 29.0* 33.0* 35.0*  MCV 94.2 91.7 94.9  MCH 29.9 29.2 29.3  MCHC 31.7 31.8 30.9  RDW 15.3 15.4 15.6*  PLT 249 405* 324    Cardiac EnzymesNo results for input(s): TROPONINI in the last 168 hours. No results for input(s): TROPIPOC in the last 168 hours.   BNP Recent Labs  Lab 10/09/20 1141  BNP 491.4*     DDimer  Recent Labs  Lab 10/09/20 1141  DDIMER 8.76*     Radiology    NM Pulmonary Perfusion  Result Date: 10/11/2020 CLINICAL DATA:  Shortness of breath and chest pain EXAM: NUCLEAR MEDICINE PERFUSION LUNG SCAN TECHNIQUE: Perfusion images were obtained in multiple projections after intravenous injection of  radiopharmaceutical. Views: Anterior, posterior, RPO, LPO, RAO, LAO RADIOPHARMACEUTICALS:  4.2 mCi Tc-34m MAA IV COMPARISON:  Chest radiograph October 10, 2020 FINDINGS: Distribution of radiotracer uptake is homogeneous and symmetric bilaterally. No appreciable perfusion defects. IMPRESSION: No appreciable perfusion defects. No findings indicative of pulmonary embolus. Electronically Signed   By: Lowella Grip III M.D.   On: 10/11/2020 11:07   DG CHEST PORT 1 VIEW  Result Date: 10/10/2020 CLINICAL DATA:  Dyspnea. EXAM: PORTABLE CHEST 1 VIEW COMPARISON:  October 09, 2020. FINDINGS: Stable cardiomediastinal silhouette. Status post coronary bypass graft. No pneumothorax or pleural effusion is noted. No acute pulmonary disease is noted. Status post right shoulder arthroplasty. IMPRESSION: No active disease. Aortic Atherosclerosis (ICD10-I70.0). Electronically Signed   By: Marijo Conception M.D.   On: 10/10/2020 10:13   VAS [place] LOWER EXTREMITY VENOUS (DVT)  Result Date: 10/10/2020  Lower Venous DVTStudy Indications: Elevated ddimer.  Comparison Study: previous 11/03/18 Performing Technologist: Abram Sander RVS  Examination Guidelines: A complete evaluation includes B-mode imaging, spectral Doppler, color Doppler, and power Doppler as needed of all accessible portions of each vessel. Bilateral testing is considered an integral part of a complete examination. Limited examinations for reoccurring indications may be performed as noted. The reflux portion of the exam is performed with the patient in reverse Trendelenburg.  +---------+---------------+---------+-----------+----------+--------------+ RIGHT    CompressibilityPhasicitySpontaneityPropertiesThrombus Aging +---------+---------------+---------+-----------+----------+--------------+ CFV      Full           Yes      Yes                                 +---------+---------------+---------+-----------+----------+--------------+ SFJ      Full                                                         +---------+---------------+---------+-----------+----------+--------------+ FV Prox  Full                                                        +---------+---------------+---------+-----------+----------+--------------+  FV Mid   Full                                                        +---------+---------------+---------+-----------+----------+--------------+ FV DistalFull                                                        +---------+---------------+---------+-----------+----------+--------------+ PFV      Full                                                        +---------+---------------+---------+-----------+----------+--------------+ POP      None           Yes      Yes                  Chronic        +---------+---------------+---------+-----------+----------+--------------+ PTV      Full                                                        +---------+---------------+---------+-----------+----------+--------------+ PERO     Full                                                        +---------+---------------+---------+-----------+----------+--------------+   +---------+---------------+---------+-----------+----------+--------------+ LEFT     CompressibilityPhasicitySpontaneityPropertiesThrombus Aging +---------+---------------+---------+-----------+----------+--------------+ CFV      Full           Yes      Yes                                 +---------+---------------+---------+-----------+----------+--------------+ SFJ      Full                                                        +---------+---------------+---------+-----------+----------+--------------+ FV Prox  Full                                                        +---------+---------------+---------+-----------+----------+--------------+ FV Mid   Full                                                         +---------+---------------+---------+-----------+----------+--------------+  FV DistalFull                                                        +---------+---------------+---------+-----------+----------+--------------+ PFV      Full                                                        +---------+---------------+---------+-----------+----------+--------------+ POP      Full           Yes      Yes                                 +---------+---------------+---------+-----------+----------+--------------+ PTV      Full                                                        +---------+---------------+---------+-----------+----------+--------------+ PERO     Full                                                        +---------+---------------+---------+-----------+----------+--------------+     Summary: RIGHT: - Findings consistent with chronic deep vein thrombosis involving the right popliteal vein. - No cystic structure found in the popliteal fossa.  LEFT: - There is no evidence of deep vein thrombosis in the lower extremity.  - No cystic structure found in the popliteal fossa.  *See table(s) above for measurements and observations. Electronically signed by Harold Barban MD on 10/10/2020 at 7:40:41 PM.    Final    VAS [place] UPPER EXTREMITY VENOUS DUPLEX  Result Date: 10/10/2020 UPPER VENOUS STUDY  Indications: elevated ddimer Comparison Study: no prior Performing Technologist: Abram Sander RVS  Examination Guidelines: A complete evaluation includes B-mode imaging, spectral Doppler, color Doppler, and power Doppler as needed of all accessible portions of each vessel. Bilateral testing is considered an integral part of a complete examination. Limited examinations for reoccurring indications may be performed as noted.  Right Findings: +----------+------------+---------+-----------+----------+-------+ RIGHT     CompressiblePhasicitySpontaneousPropertiesSummary  +----------+------------+---------+-----------+----------+-------+ IJV           Full       Yes       Yes                      +----------+------------+---------+-----------+----------+-------+ Subclavian    Full       Yes       Yes                      +----------+------------+---------+-----------+----------+-------+ Axillary      Full       Yes       Yes                      +----------+------------+---------+-----------+----------+-------+ Brachial  Full       Yes       Yes                      +----------+------------+---------+-----------+----------+-------+ Radial        Full                                          +----------+------------+---------+-----------+----------+-------+ Ulnar         Full                                          +----------+------------+---------+-----------+----------+-------+ Cephalic      Full                                          +----------+------------+---------+-----------+----------+-------+ Basilic       Full                                          +----------+------------+---------+-----------+----------+-------+  Left Findings: +----------+------------+---------+-----------+----------+-------+ LEFT      CompressiblePhasicitySpontaneousPropertiesSummary +----------+------------+---------+-----------+----------+-------+ IJV           Full       Yes       Yes                      +----------+------------+---------+-----------+----------+-------+ Subclavian    Full       Yes       Yes                      +----------+------------+---------+-----------+----------+-------+ Axillary      Full       Yes       Yes                      +----------+------------+---------+-----------+----------+-------+ Brachial      Full       Yes       Yes                      +----------+------------+---------+-----------+----------+-------+ Radial        Full                                           +----------+------------+---------+-----------+----------+-------+ Ulnar         Full                                          +----------+------------+---------+-----------+----------+-------+ Cephalic      Full                                          +----------+------------+---------+-----------+----------+-------+ Basilic       Full                                          +----------+------------+---------+-----------+----------+-------+  Summary: No evidence of deep vein or superficial vein thrombosis involving the right and left upper extremities.  *See table(s) above for measurements and observations.  Diagnosing physician: Harold Barban MD Electronically signed by Harold Barban MD on 10/10/2020 at 7:40:28 PM.    Final     Cardiac Studies   Echocardiogram 10/09/2020: Impressions: 1. Diastology suggestive of impaired LV filling. Left ventricular  ejection fraction, by estimation, is 60 to 65%. The left ventricle has  normal function. The left ventricle has no regional wall motion  abnormalities. There is moderate concentric left  ventricular hypertrophy. Left ventricular diastolic parameters are  indeterminate.   2. Right ventricular systolic function is low normal. The right  ventricular size is normal. There is normal pulmonary artery systolic  pressure.   3. Left atrial size was moderately dilated.   4. Right atrial size was moderately dilated.   5. The mitral valve is myxomatous. No evidence of mitral valve  regurgitation. The mean mitral valve gradient is 5.0 mmHg with average  heart rate of 87 bpm. Moderate mitral annular calcification.   6. The aortic valve is tricuspid. Aortic valve regurgitation is not  visualized.   7. The inferior vena cava is dilated in size with <50% respiratory  variability, suggesting right atrial pressure of 15 mmHg.   Comparison(s): A prior study was performed on 10/05/2019. No significant  change from prior study.  Subtle decrease in RV function.   Patient Profile     79 y.o. female with a history of CAD s/p CABG in 2004 with subsequent DES to left main in 7616, chronic diastolic CHF, carotid artery disease, PAD s/p SMA stenting in 2019, recurrent DVT/PE s/p IVC filter in 02/2018 not on anticoagulation due to GI bleed and prior large abdominal wall hematoma, COPD on home O2, cirrhosis, hypertension, hyperlipidemia, hyperthyroidism, and depression who presented with worsening SOB, fevers and chest pain found to have COPD exacerbation, pneumonia and volume overload.  Assessment & Plan    1. Acute on chronic hypoxic respiratory failure: likely multifactorial in the setting of community acquired PNA, COPD on 4L O2 at home, and acute on chronic diastolic CHF. Felt to be mildly volume overloaded on exam but likely not the primary source of her hypoxia. AoCKD limited diuresis. There was concern for possible PE, though VQ scan was negative. She has been continued on IV antibiotics and steroids for her PNA. Weight is up to 177lbs today from 167lbs on admission. BP is stable of pressors.  Cr improved to 1.42 from 2.19 yesterday.  - Will trial a dose of IV lasix 40mg  x1 and monitor for response - Continue antibiotics and steroids per primary team  2. Acute on chronic diastolic CHF: EF 07-37% this admission. Here with acute on chronic hypoxic respiratory failure. Felt to be mildly volume overloaded on exam, though hypotension requiring pressors and AoCKD initially limited diuresis, both improved today. Weight is up to 177lbs today from 167lbs on admission, though down 2lbs from yesterday.  - Will trial a dose of IV lasix 40mg  x1 and monitor for response - Continue to monitor strict I&Os and daily weights - Continue to monitor electrolytes closely and replete to maintain K >4, Mg >2  3. Chest pain in patient with CAD s/p CABG in 2004 with subsequent PCI/DES to LM in 2010: chest pain is predominately related to coughing.  HsTrop with low flat trend in the 30s-40s, not c/w ACS. Suspect demand ischemia in the setting of #1. Likely not on BBlocker  at baseline due to underlying severe COPD.  - Continue aspirin and statin  4. Recurrent PE/DVT s/p IVC filter: IVC filter placed in 2019 due to history of GI bleed and abdominal wall hematoma on anticoagulation in the past. Maintained on a heparin gtt due to elevated Ddimer, however VQ scan yesterday was not suggestive of PE and heparin gtt was discontinued.  - Continue management per primary team  5. HTN: BP stable off pressors - Continue to monitor and reintroduce home medications as BP/Cr will tolerate.  6. Acute on chronic renal insufficiency: Cr improved to 1.42 today from 2.19 yesterday. Baseline appears to be 1.1-1.4 - Continue to monitor closely  For questions or updates, please contact [place] Please consult [email or web address] for contact info under Cardiology/STEMI.      Signed, Abigail Butts, PA-C  10/12/2020, 8:34 AM   [phone]  See other progress note.  Gwyndolyn Kaufman, MD

## 2020-10-12 NOTE — Progress Notes (Signed)
Progress Note  Patient Name: [name] Date of Encounter: 10/12/2020  Primary Cardiologist: Dorris Carnes, MD   Subjective   She reports improvement in her breathing. No complaints of chest pain or palpitations.   Inpatient Medications    Scheduled Meds: . allopurinol  100 mg Oral Daily  . arformoterol  15 mcg Nebulization BID  . ARIPiprazole  2 mg Oral Daily  . ascorbic acid  500 mg Oral Daily  . aspirin  81 mg Oral Daily  . atorvastatin  10 mg Oral Daily  . budesonide (PULMICORT) nebulizer solution  0.5 mg Nebulization BID  . chlorhexidine  15 mL Mouth Rinse BID  . Chlorhexidine Gluconate Cloth  6 each Topical Daily  . citalopram  40 mg Oral QHS  . doxycycline  100 mg Oral Q12H  . enoxaparin (LOVENOX) injection  40 mg Subcutaneous Q24H  . ferrous sulfate  325 mg Oral Q breakfast  . furosemide  40 mg Intravenous Once  . gabapentin  100 mg Oral Daily  . ipratropium-albuterol  3 mL Nebulization BID  . levothyroxine  50 mcg Oral Q0600  . magnesium oxide  400 mg Oral BID  . mouth rinse  15 mL Mouth Rinse q12n4p  . melatonin  10 mg Oral QHS  . pantoprazole  40 mg Oral QHS  . pilocarpine  1 drop Both Eyes BID  . polyethylene glycol  17 g Oral Daily  . pramipexole  0.25 mg Oral QPM  . predniSONE  40 mg Oral Q breakfast  . roflumilast  500 mcg Oral QPM  . sodium chloride flush  3 mL Intravenous Q12H  . traZODone  150 mg Oral QHS   Continuous Infusions: . sodium chloride Stopped (10/11/20 0639)  . cefTRIAXone (ROCEPHIN)  IV Stopped (10/11/20 1734)   PRN Meds: acetaminophen **OR** acetaminophen, albuterol, benzonatate, guaiFENesin, morphine injection, nitroGLYCERIN   Vital Signs    Vitals:   10/12/20 0500 10/12/20 0745 10/12/20 0754 10/12/20 1325  BP:    123/61  Pulse:    (!) 110  Resp:    18  Temp:    98.9 F (37.2 C)  TempSrc:    Oral  SpO2:  97% 97% 93%  Weight: 80.7 kg     Height:        Intake/Output Summary (Last 24 hours) at 10/12/2020 1330 Last  data filed at 10/12/2020 1056 Gross per 24 hour  Intake 817.97 ml  Output 350 ml  Net 467.97 ml   Filed Weights   10/10/20 0219 10/11/20 0406 10/12/20 0500  Weight: 75.3 kg 81.3 kg 80.7 kg    Telemetry    Sinus rhythm with intermittent sinus tachycardia with occasional PVCs/PACs - Personally Reviewed  ECG    No new tracings - Personally Reviewed  Physical Exam   GEN: No acute distress.   Neck: No JVD, no carotid bruits Cardiac: RRR, no murmurs, rubs, or gallops.  Respiratory: scattered crackles without overt wheezing/rhonchi GI: NABS, Soft, nontender, non-distended  MS: trace LE edema; No deformity. Neuro:  Nonfocal, moving all extremities spontaneously Psych: Normal affect   Labs    Chemistry Recent Labs  Lab 10/09/20 1141 10/10/20 0245 10/11/20 0300  NA 138 133* 137  K 4.1 3.8 4.0  CL 97* 97* 102  CO2 28 24 25   GLUCOSE 117* 197* 139*  BUN 46* 52* 47*  CREATININE 1.97* 2.19* 1.42*  CALCIUM 9.2 8.2* 8.6*  PROT  --   --  6.2*  ALBUMIN  --   --  2.4*  AST  --   --  43*  ALT  --   --  30  ALKPHOS  --   --  74  BILITOT  --   --  0.5  GFRNONAA 23* 21* 35*  ANIONGAP 13 12 10      Hematology Recent Labs  Lab 10/10/20 0245 10/11/20 0300 10/12/20 0341  WBC 16.4* 36.9* 17.1*  RBC 3.08* 3.60* 3.69*  HGB 9.2* 10.5* 10.8*  HCT 29.0* 33.0* 35.0*  MCV 94.2 91.7 94.9  MCH 29.9 29.2 29.3  MCHC 31.7 31.8 30.9  RDW 15.3 15.4 15.6*  PLT 249 405* 324    Cardiac EnzymesNo results for input(s): TROPONINI in the last 168 hours. No results for input(s): TROPIPOC in the last 168 hours.   BNP Recent Labs  Lab 10/09/20 1141  BNP 491.4*     DDimer  Recent Labs  Lab 10/09/20 1141  DDIMER 8.76*     Radiology    NM Pulmonary Perfusion  Result Date: 10/11/2020 CLINICAL DATA:  Shortness of breath and chest pain EXAM: NUCLEAR MEDICINE PERFUSION LUNG SCAN TECHNIQUE: Perfusion images were obtained in multiple projections after intravenous injection of  radiopharmaceutical. Views: Anterior, posterior, RPO, LPO, RAO, LAO RADIOPHARMACEUTICALS:  4.2 mCi Tc-67m MAA IV COMPARISON:  Chest radiograph October 10, 2020 FINDINGS: Distribution of radiotracer uptake is homogeneous and symmetric bilaterally. No appreciable perfusion defects. IMPRESSION: No appreciable perfusion defects. No findings indicative of pulmonary embolus. Electronically Signed   By: Lowella Grip III M.D.   On: 10/11/2020 11:07    Cardiac Studies   Echocardiogram 10/09/2020: Impressions: 1. Diastology suggestive of impaired LV filling. Left ventricular  ejection fraction, by estimation, is 60 to 65%. The left ventricle has  normal function. The left ventricle has no regional wall motion  abnormalities. There is moderate concentric left  ventricular hypertrophy. Left ventricular diastolic parameters are  indeterminate.  2. Right ventricular systolic function is low normal. The right  ventricular size is normal. There is normal pulmonary artery systolic  pressure.  3. Left atrial size was moderately dilated.  4. Right atrial size was moderately dilated.  5. The mitral valve is myxomatous. No evidence of mitral valve  regurgitation. The mean mitral valve gradient is 5.0 mmHg with average  heart rate of 87 bpm. Moderate mitral annular calcification.  6. The aortic valve is tricuspid. Aortic valve regurgitation is not  visualized.  7. The inferior vena cava is dilated in size with <50% respiratory  variability, suggesting right atrial pressure of 15 mmHg.   Comparison(s): A prior study was performed on 10/05/2019. No significant  change from prior study. Subtle decrease in RV function.   Patient Profile     79 y.o.femalewith a history of CAD s/p CABG in 2004 with subsequent DES to left main in 9678, chronic diastolic CHF, carotid artery disease, PAD s/p SMA stenting in 2019, recurrent DVT/PE s/p IVC filter in3/2019not on anticoagulation due to GI bleed and prior  large abdominal wall hematoma, COPD on home O2, cirrhosis, hypertension, hyperlipidemia, hyperthyroidism, and depression who presented with worsening SOB, fevers and chest pain found to have COPD exacerbation, pneumonia and volume overload.  Assessment & Plan    1. Acute on chronic hypoxic respiratory failure: likely multifactorial in the setting of community acquired PNA, COPD on 4L O2 at home, and acute on chronic diastolic CHF. Felt to be mildly volume overloaded on exam but likely not the primary source of her hypoxia. AoCKD limited diuresis. There was concern for possible  PE, though VQ scan was negative. She has been continued on IV antibiotics and steroids for her PNA. Weight is up to 177lbs today from 167lbs on admission. BP is stable of pressors.  Cr improved to 1.42 from 2.19 yesterday.  - Will trial a dose of IV lasix 40mg  x1 and monitor for response - Continue antibiotics and steroids per primary team  2. Acute on chronic diastolic CHF: EF 88-41% this admission. Here with acute on chronic hypoxic respiratory failure. Felt to be mildly volume overloaded on exam, though hypotension requiring pressors and AoCKD initially limited diuresis, both improved today. Weight is up to 177lbs today from 167lbs on admission, though down 2lbs from yesterday.  - Will trial a dose of IV lasix 40mg  x1 and monitor for response - Continue to monitor strict I&Os and daily weights - Continue to monitor electrolytes closely and replete to maintain K >4, Mg >2  3. Chest pain in patient with CAD s/p CABG in 2004 with subsequent PCI/DES to LM in 2010: chest pain is predominately related to coughing. HsTrop with low flat trend in the 30s-40s, not c/w ACS. Suspect demand ischemia in the setting of #1. Likely not on BBlocker at baseline due to underlying severe COPD.  - Continue aspirin and statin  4. Recurrent PE/DVT s/p IVC filter: IVC filter placed in 2019 due to history of GI bleed and abdominal wall hematoma on  anticoagulation in the past. Maintained on a heparin gtt due to elevated Ddimer, however VQ scan yesterday was not suggestive of PE and heparin gtt was discontinued.  - Continue management per primary team  5. HTN: BP stable off pressors - Continue to monitor and reintroduce home medications as BP/Cr will tolerate.  6. Acute on chronic renal insufficiency: Cr improved to 1.42 today from 2.19 yesterday. Baseline appears to be 1.1-1.4 - Continue to monitor closely  For questions or updates, please contact [place] Please consult [email or web address] for contact info under Cardiology/STEMI.      Signed, Roby Lofts, PA-C  Patient seen and examined and agree with Roby Lofts as detailed above.  Doing much better today. Breathing significantly improved and no further chest pain. Asking to go home.  Exam: General: Comfortable, NAD CV: RR, no murmurs Resp: Scattered crackles, no wheezes Abd: Soft, ND, NTTP Extremities: Warm, trace right pedal edema Neuro: Non-focal  Plan: -Start lasix 40mg  IV once; follow-up volume status and can re-dose as needed -Continue ASA and statin -V/Q scan not suggestive of PE; no need for [facility] for now as IVC filter in place given history of GIB/abdominal wall hematoma -Management of COPD and pneumonia per prior team  Hopefully transition to PO diuretic tomorrow and then cleared from CV standpoint to go home.   Gwyndolyn Kaufman, MD

## 2020-10-12 NOTE — Progress Notes (Signed)
PROGRESS NOTE    [name]  QZR:[id number] DOB: 05/16/1941 DOA: 10/09/2020 PCP: [name], MD   Chief Complaint  Patient presents with  . 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  Patient was seen in the urgent care 1 week ago for her cough and shortness of breath and CXR showed infiltrates and she was started on antibiotics.  She returned to the UC with worsening cough and shortness of breath and fever 100.16F at home.  She was having trouble with her O2 tank at home and was out of O2 and so she was noted to be 72% on room air at [facility]  Covid test was negative on both occasions.   States that she typically uses her O2 intermittently but has been requiring constant supplemental O2 over the past week.  Also has had some chest pain and has been taking so much nitroglycerin that she has run out of it in the past 24 hours.  Also admits to orthopnea and lower extremity edema.  ED Course: Afebrile, tachycardic, tachypneic, hypotensive (SBP 80s and 90s despite IV fluids) requiring 4 L/min.  Notable labs: Glucose 117, BUN 46, creatinine 1.97 (previously 1.19 on 09/10/2020), troponin 36, WBC 18.0, Hb 10.6, COVID-19 negative.  CXR with suspicion of bronchitis and hazy pneumonia in the mid and lower lungs with no dense consolidation or lobar collapse.  She was given azithromycin and ceftriaxone for CAP therapy and IV fluids and hospitalist was asked to admit.  Assessment & Plan:   Principal Problem:   Acute  on chronic respiratory failure with hypoxia (HCC) Active Problems:   Essential hypertension   AKI (acute kidney injury) ([place])   Acute on chronic diastolic heart failure (HCC)   History of pulmonary embolism   Hypotension   CAP (community acquired pneumonia)   1. Acute on chronic hypoxic respiratory failure, multifactorial: suspect 2/2 community acquired pneumonia with possible contribution from COPD exacerbation and volume overload Maddyx Vallie. Currently stable on home O2 screen b. Continue ceftriaxone/doxycycline for concern for CAP (10/19 - present) c. Continue steroids d. Negative flu/covid testing, pending blood cx, negative MRSA pcr e. Cardiology following for concern for HF, appreciate assistance f. Regarding concern for PE, VQ scan without appreciable perfusion defects, no findings indicative of PE g. See below for additional details   2. Septic Shock 2/2 CAP  Hypotension:  1. Continue ceftriaxone/doxycycline 2. Follow blood cultures, negative MRSA pcr, negative COVD and flu testing 3. Off pressors 4. Appreciate PCCM assistance, now signed off 5. CXR on admission with concern for bronchitis and pneumonia in mid and lower lungs 6. Repeat CXR 10/20 without acute findings  3. Chest Pain  CAD s/p CABG and DES:  1. Low suspicion for ACS per cardiology 2. Morphine, nitro prn  3. Continue aspirin, continue statin 4. Appreciate cardiology assistance  4. Recurrent PE/DVT s/p IVC filter  Concern for Recurrent PE 1. S/p IVC filter 02/2018, not on anticoagulation 2/2 GI bleed and prior large abdominal wall hematoma 2. Concern for recurrent PE as noted above, follow  VQ scan when able (vs CT PE protocol when renal function improves) 3. Ultrasound negative for DVT of R and L upper extremities, chronic DVT of R popliteal vein  4. Heparin gtt discontinued  5. AKI on CKD 3a, Multifactorial: hemodynamic changes, lisinopril, possibly cardio renal Lonette Stevison. Hold lisinopril b. Creatinine peaked to 2.19  yesterday, imrpoving now c. UA bland   4. Acute on chronic diastolic heart failure exacerbation Javarus Dorner. Appreciate cards recs - follow with IV lasix x1, follow volume status  5. COPD, possible mild exacerbation Alger Kerstein. Duonebs and Albuterol  6. Hypothyroidism Saraya Tirey. Continue synthroid  DVT prophylaxis: heparin Code Status: DNR Family Communication: son at bedsdie Disposition:   Status is: Inpatient  Remains inpatient appropriate because:Inpatient level of care appropriate due to severity of illness   Dispo: The patient is from: Home              Anticipated d/c is to: pending              Anticipated d/c date is: > 3 days              Patient currently is not medically stable to d/c.  Consultants:   PCCM  IR  Cardiology  Procedures:  [place] LE Summary:  RIGHT:  - Findings consistent with chronic deep vein thrombosis involving the  right popliteal vein.  - No cystic structure found in the popliteal fossa.    LEFT:  - There is no evidence of deep vein thrombosis in the lower extremity.    - No cystic structure found in the popliteal fossa.   [place] UE Summary:  No evidence of deep vein or superficial vein thrombosis involving the  right and  left upper extremities.  Echo IMPRESSIONS    1. Diastology suggestive of impaired LV filling. Left ventricular  ejection fraction, by estimation, is 60 to 65%. The left ventricle has  normal function. The left ventricle has no regional wall motion  abnormalities. There is moderate concentric left  ventricular hypertrophy. Left ventricular diastolic parameters are  indeterminate.  2. Right ventricular systolic function is low normal. The right  ventricular size is normal. There is normal pulmonary artery systolic  pressure.  3. Left atrial size was moderately dilated.  4. Right atrial size was moderately dilated.  5. The mitral valve is myxomatous. No evidence of mitral valve  regurgitation. The mean mitral valve gradient is  5.0 mmHg with average  heart rate of 87 bpm. Moderate mitral annular calcification.  6. The aortic valve is tricuspid. Aortic valve regurgitation is not  visualized.  7. The inferior vena cava is dilated in size with <50% respiratory  variability, suggesting right atrial pressure of 15 mmHg.   Comparison(s): Glynis Hunsucker prior study was performed on 10/05/2019. No significant  change from prior study. Subtle decrease in RV function.   Antimicrobials:  Anti-infectives (From admission, onward)   Start     Dose/Rate Route Frequency Ordered Stop   10/09/20 2200  doxycycline (VIBRA-TABS) tablet 100 mg        100 mg Oral Every 12 hours 10/09/20 1655 10/14/20 2159   10/09/20 1700  cefTRIAXone (ROCEPHIN) 2 g in sodium chloride 0.9 % 100 mL IVPB        2 g 200 mL/hr over 30 Minutes Intravenous Every 24 hours 10/09/20 1655 10/14/20 1659   10/09/20 1400  cefTRIAXone (ROCEPHIN) 1 g in sodium chloride 0.9 % 100 mL IVPB        1 g 200 mL/hr over  30 Minutes Intravenous  Once 10/09/20 1345 10/09/20 1448   10/09/20 1400  azithromycin (ZITHROMAX) 500 mg in sodium chloride 0.9 % 250 mL IVPB        500 mg 250 mL/hr over 60 Minutes Intravenous  Once 10/09/20 1345 10/09/20 1657     Subjective: Wants to go home (discussed rationale for additional w/u, therapy eval prior to d/c home)  Objective: Vitals:   10/12/20 0500 10/12/20 0745 10/12/20 0754 10/12/20 1325  BP:    123/61  Pulse:    (!) 110  Resp:    18  Temp:    98.9 F (37.2 C)  TempSrc:    Oral  SpO2:  97% 97% 93%  Weight: 80.7 kg     Height:        Intake/Output Summary (Last 24 hours) at 10/12/2020 1515 Last data filed at 10/12/2020 1500 Gross per 24 hour  Intake 1177.97 ml  Output 750 ml  Net 427.97 ml   Filed Weights   10/10/20 0219 10/11/20 0406 10/12/20 0500  Weight: 75.3 kg 81.3 kg 80.7 kg    Examination:  General: No acute distress. Cardiovascular: Heart sounds show Erinn Mendosa regular rate, and rhythm.  Lungs: no adventitious lung  sounds, comfortable on 4 L  Abdomen: Soft, nontender, nondistended Neurological: Alert and oriented 3. Moves all extremities 4. Cranial nerves II through XII grossly intact. Skin: Warm and dry. No rashes or lesions. Extremities: No clubbing or cyanosis. Bilateral LE edema   Data Reviewed: I have personally reviewed following labs and imaging studies  CBC: Recent Labs  Lab 10/09/20 1141 10/10/20 0245 10/11/20 0300 10/12/20 0341  WBC 18.0* 16.4* 36.9* 17.1*  HGB 10.6* 9.2* 10.5* 10.8*  HCT 33.5* 29.0* 33.0* 35.0*  MCV 93.8 94.2 91.7 94.9  PLT 280 249 405* 272    Basic Metabolic Panel: Recent Labs  Lab 10/09/20 1141 10/10/20 0245 10/11/20 0300  NA 138 133* 137  K 4.1 3.8 4.0  CL 97* 97* 102  CO2 28 24 25   GLUCOSE 117* 197* 139*  BUN 46* 52* 47*  CREATININE 1.97* 2.19* 1.42*  CALCIUM 9.2 8.2* 8.6*  MG  --   --  2.5*  PHOS  --   --  2.8    GFR: Estimated Creatinine Clearance: 35.2 mL/min (Janit Cutter) (by C-G formula based on SCr of 1.42 mg/dL (H)).  Liver Function Tests: Recent Labs  Lab 10/11/20 0300  AST 43*  ALT 30  ALKPHOS 74  BILITOT 0.5  PROT 6.2*  ALBUMIN 2.4*    CBG: No results for input(s): GLUCAP in the last 168 hours.   Recent Results (from the past 240 hour(s))  Respiratory Panel by RT PCR (Flu Santonio Speakman&B, Covid) - Nasopharyngeal Swab     Status: None   Collection Time: 10/09/20 12:11 PM   Specimen: Nasopharyngeal Swab  Result Value Ref Range Status   SARS Coronavirus 2 by RT PCR NEGATIVE NEGATIVE Final    Comment: (NOTE) SARS-CoV-2 target nucleic acids are NOT DETECTED.  The SARS-CoV-2 RNA is generally detectable in upper respiratoy specimens during the acute phase of infection. The lowest concentration of SARS-CoV-2 viral copies this assay can detect is 131 copies/mL. Anish Vana negative result does not preclude SARS-Cov-2 infection and should not be used as the sole basis for treatment or other patient management decisions. Maysie Parkhill negative result may occur with   improper specimen collection/handling, submission of specimen other than nasopharyngeal swab, presence of viral mutation(s) within the areas targeted by this assay, and inadequate number  of viral copies (<131 copies/mL). Riya Huxford negative result must be combined with clinical observations, patient history, and epidemiological information. The expected result is Negative.  Fact Sheet for Patients:  PinkCheek.be  Fact Sheet for Healthcare Providers:  GravelBags.it  This test is no t yet approved or cleared by the [place] FDA and  has been authorized for detection and/or diagnosis of SARS-CoV-2 by FDA under an Emergency Use Authorization (EUA). This EUA will remain  in effect (meaning this test can be used) for the duration of the COVID-19 declaration under Section 564(b)(1) of the Act, 21 U.S.C. section 360bbb-3(b)(1), unless the authorization is terminated or revoked sooner.     Influenza Ovidio Steele by PCR NEGATIVE NEGATIVE Final   Influenza B by PCR NEGATIVE NEGATIVE Final    Comment: (NOTE) The Xpert Xpress SARS-CoV-2/FLU/RSV assay is intended as an aid in  the diagnosis of influenza from Nasopharyngeal swab specimens and  should not be used as Margarine Grosshans sole basis for treatment. Nasal washings and  aspirates are unacceptable for Xpert Xpress SARS-CoV-2/FLU/RSV  testing.  Fact Sheet for Patients: PinkCheek.be  Fact Sheet for Healthcare Providers: GravelBags.it  This test is not yet approved or cleared by the [place] FDA and  has been authorized for detection and/or diagnosis of SARS-CoV-2 by  FDA under an Emergency Use Authorization (EUA). This EUA will remain  in effect (meaning this test can be used) for the duration of the  Covid-19 declaration under Section 564(b)(1) of the Act, 21  U.S.C. section 360bbb-3(b)(1), unless the authorization is  terminated or  revoked. Performed at [facility], [place] [address], [place], [place] [address]   Culture, blood (routine x 2)     Status: None (Preliminary result)   Collection Time: 10/09/20 12:43 PM   Specimen: BLOOD  Result Value Ref Range Status   Specimen Description   Final    BLOOD LEFT ANTECUBITAL Performed at [facility] [address] [place], [place] [address]    Special Requests   Final    BOTTLES DRAWN AEROBIC AND ANAEROBIC Blood Culture adequate volume Performed at [facility] [address], [place], [place] [address]    Culture   Final    NO GROWTH 3 DAYS Performed at [facility], [place] [address] [place], [place] [address]    Report Status PENDING  Incomplete  Culture, blood (routine x 2)     Status: None (Preliminary result)   Collection Time: 10/09/20 12:48 PM   Specimen: BLOOD  Result Value Ref Range Status   Specimen Description   Final    BLOOD RIGHT ANTECUBITAL Performed at [facility] [address], [place], [place] [address]    Special Requests   Final    BOTTLES DRAWN AEROBIC AND ANAEROBIC Blood Culture results may not be optimal due to an inadequate volume of blood received in culture bottles Performed at [facility] [address] [place], [place] [address]    Culture   Final    NO GROWTH 3 DAYS Performed at [facility], [place] [address], [place], [place] [address]    Report Status PENDING  Incomplete  Culture, Urine     Status: Abnormal   Collection Time: 10/09/20  6:35 PM   Specimen: Urine, Clean Catch  Result Value Ref Range Status   Specimen Description   Final    URINE, CLEAN CATCH Performed at [facility], [place] [address] [place],  [place] [address]    Special Requests   Final    NONE Performed at [facility], [place] [address], [place], [place] [address]    Culture (Susane Bey)  Final    <10,000  COLONIES/mL INSIGNIFICANT GROWTH Performed at [facility] [address], [place], [place] [address]    Report Status 10/12/2020 FINAL  Final  MRSA PCR Screening     Status: None   Collection Time: 10/10/20  1:45 AM   Specimen: Nasal Mucosa; Nasopharyngeal  Result Value Ref Range Status   MRSA by PCR NEGATIVE NEGATIVE Final    Comment:        The GeneXpert MRSA Assay (FDA approved for NASAL specimens only), is one component of Kelty Szafran comprehensive MRSA colonization surveillance program. It is not intended to diagnose MRSA infection nor to guide or monitor treatment for MRSA infections. Performed at [facility], [place] [address], [place], [place] [address]          Radiology Studies: NM Pulmonary Perfusion  Result Date: 10/11/2020 CLINICAL DATA:  Shortness of breath and chest pain EXAM: NUCLEAR MEDICINE PERFUSION LUNG SCAN TECHNIQUE: Perfusion images were obtained in multiple projections after intravenous injection of radiopharmaceutical. Views: Anterior, posterior, RPO, LPO, RAO, LAO RADIOPHARMACEUTICALS:  4.2 mCi Tc-68m MAA IV COMPARISON:  Chest radiograph October 10, 2020 FINDINGS: Distribution of radiotracer uptake is homogeneous and symmetric bilaterally. No appreciable perfusion defects. IMPRESSION: No appreciable perfusion defects. No findings indicative of pulmonary embolus. Electronically Signed   By: Lowella Grip III M.D.   On: 10/11/2020 11:07        Scheduled Meds: . allopurinol  100 mg Oral Daily  . arformoterol  15 mcg Nebulization BID  . ARIPiprazole  2 mg Oral Daily  . ascorbic acid  500 mg Oral Daily  . aspirin  81 mg Oral Daily  . atorvastatin  10 mg Oral Daily  . budesonide (PULMICORT) nebulizer solution  0.5 mg Nebulization BID  . chlorhexidine  15 mL Mouth Rinse BID  . Chlorhexidine Gluconate Cloth  6 each Topical Daily  . citalopram  40 mg Oral QHS  . doxycycline  100 mg Oral Q12H  . enoxaparin (LOVENOX) injection  40 mg  Subcutaneous Q24H  . ferrous sulfate  325 mg Oral Q breakfast  . gabapentin  100 mg Oral Daily  . ipratropium-albuterol  3 mL Nebulization BID  . levothyroxine  50 mcg Oral Q0600  . magnesium oxide  400 mg Oral BID  . mouth rinse  15 mL Mouth Rinse q12n4p  . melatonin  10 mg Oral QHS  . pantoprazole  40 mg Oral QHS  . pilocarpine  1 drop Both Eyes BID  . polyethylene glycol  17 g Oral Daily  . pramipexole  0.25 mg Oral QPM  . predniSONE  40 mg Oral Q breakfast  . roflumilast  500 mcg Oral QPM  . sodium chloride flush  3 mL Intravenous Q12H  . traZODone  150 mg Oral QHS   Continuous Infusions: . sodium chloride Stopped (10/11/20 0639)  . cefTRIAXone (ROCEPHIN)  IV Stopped (10/11/20 1734)     LOS: 3 days    Time spent: over 30 min    Fayrene Helper, MD [facility]   To contact the attending provider between 7A-7P or the covering provider during after hours 7P-7A, please log into the web site [email or web address] and access using universal [place] password for that web site. If you do not have the password, please call  the hospital operator.  10/12/2020, 3:15 PM

## 2020-10-13 DIAGNOSIS — Z86711 Personal history of pulmonary embolism: Secondary | ICD-10-CM

## 2020-10-13 DIAGNOSIS — I10 Essential (primary) hypertension: Secondary | ICD-10-CM

## 2020-10-13 LAB — COMPREHENSIVE METABOLIC PANEL
ALT: 26 U/L (ref 0–44)
AST: 20 U/L (ref 15–41)
Albumin: 2.4 g/dL — ABNORMAL LOW (ref 3.5–5.0)
Alkaline Phosphatase: 62 U/L (ref 38–126)
Anion gap: 12 (ref 5–15)
BUN: 30 mg/dL — ABNORMAL HIGH (ref 8–23)
CO2: 29 mmol/L (ref 22–32)
Calcium: 9 mg/dL (ref 8.9–10.3)
Chloride: 101 mmol/L (ref 98–111)
Creatinine, Ser: 0.95 mg/dL (ref 0.44–1.00)
GFR, Estimated: >60 mL/min (ref >60)
Glucose, Bld: 88 mg/dL (ref 70–99)
Potassium: 4.2 mmol/L (ref 3.5–5.1)
Sodium: 142 mmol/L (ref 135–145)
Total Bilirubin: 0.5 mg/dL (ref 0.3–1.2)
Total Protein: 5.8 g/dL — ABNORMAL LOW (ref 6.5–8.1)

## 2020-10-13 LAB — CBC WITH DIFFERENTIAL/PLATELET
Abs Immature Granulocytes: 1.22 10*3/uL — ABNORMAL HIGH (ref 0.00–0.07)
Basophils Absolute: 0.1 10*3/uL (ref 0.0–0.1)
Basophils Relative: 1 %
Eosinophils Absolute: 0 10*3/uL (ref 0.0–0.5)
Eosinophils Relative: 0 %
HCT: 32.7 % — ABNORMAL LOW (ref 36.0–46.0)
Hemoglobin: 10.2 g/dL — ABNORMAL LOW (ref 12.0–15.0)
Immature Granulocytes: 9 %
Lymphocytes Relative: 13 %
Lymphs Abs: 1.7 10*3/uL (ref 0.7–4.0)
MCH: 29.9 pg (ref 26.0–34.0)
MCHC: 31.2 g/dL (ref 30.0–36.0)
MCV: 95.9 fL (ref 80.0–100.0)
Monocytes Absolute: 1 10*3/uL (ref 0.1–1.0)
Monocytes Relative: 8 %
Neutro Abs: 9.3 10*3/uL — ABNORMAL HIGH (ref 1.7–7.7)
Neutrophils Relative %: 69 %
Platelets: 314 10*3/uL (ref 150–400)
RBC: 3.41 MIL/uL — ABNORMAL LOW (ref 3.87–5.11)
RDW: 15.8 % — ABNORMAL HIGH (ref 11.5–15.5)
WBC: 13.4 10*3/uL — ABNORMAL HIGH (ref 4.0–10.5)
nRBC: 0 % (ref 0.0–0.2)

## 2020-10-13 LAB — MAGNESIUM: Magnesium: 2.1 mg/dL (ref 1.7–2.4)

## 2020-10-13 LAB — PHOSPHORUS: Phosphorus: 3.6 mg/dL (ref 2.5–4.6)

## 2020-10-13 MED ORDER — PREDNISONE 10 MG PO TABS: 10.0000 mg | ORAL_TABLET | Freq: Every day | ORAL | 0 refills | Status: DC

## 2020-10-13 MED ORDER — FUROSEMIDE 40 MG PO TABS
40.0000 mg | ORAL_TABLET | Freq: Every day | ORAL | Status: DC
  Administered 2020-10-13: 40 mg via ORAL
  Filled 2020-10-13: qty 1

## 2020-10-13 MED ORDER — ASPIRIN 81 MG PO CHEW: 81.0000 mg | CHEWABLE_TABLET | Freq: Every day | ORAL | 0 refills | Status: AC

## 2020-10-13 NOTE — Discharge Summary (Addendum)
Physician Discharge Summary  [name] AVW:[id number] DOB: Jan 26, 1941 DOA: 10/09/2020  PCP: [name], MD  Admit date: 10/09/2020 Discharge date: 10/13/2020  Time spent: 40 minutes  Recommendations for Outpatient Follow-up:  1. Follow outpatient CBC/CMP 2. Follow respiratory status outpatient 3. Follow blood pressure outpatient - currently holding lisinopril and spironolactone - follow to consider resumption - held on discharge due to soft BP 4. Follow renal function and electrolytes on home diuretic regimen with lasix/metolazone  5. Started on aspirin, follow 6. Follow with cardiology outpatient 7. Hx VTE s/p IVC filter, VQ scan negative for PE, chronic RLE DVT on [place] -> follow outpatient - no anticoagulation at this time  Discharge Diagnoses:  Principal Problem:   Acute on chronic respiratory failure with hypoxia ([place]) Active Problems:   Essential hypertension   AKI (acute kidney injury) ([place])   Acute on chronic diastolic heart failure ([facility])   History of pulmonary embolism   Hypotension   CAP (community acquired pneumonia)   Discharge Condition: stable  Diet recommendation: heart healthy  Filed Weights   10/11/20 0406 10/12/20 0500 10/13/20 0500  Weight: 81.3 kg 80.7 kg 77.8 kg   History of present illness:  This is an 79 year old female with past medical history of CAD s/p CABG and DES, CAD, HFpEF(Lasix was recently decreased per patient), COPD, depression,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 Patient was seen in the urgent care 1 week ago for her cough and shortness of breath and CXR showed infiltrates and she was started on antibiotics. She returned to the UC  with worsening cough and shortness of breath and fever 100.8Fat home. She was having trouble with her O2 tank at home and was out of O2 and so she was noted to be 72% on room air at [facility]. Covid test was negative on both occasions.   States that she typically uses her O2 intermittentlybut has been requiring constant supplemental O2 over the past week. Also has had some chest pain and has been taking so much nitroglycerin that she has run out of it in the past 24 hours. Also admits to orthopnea and lower extremity edema.  ED Course:Afebrile, tachycardic, tachypneic, hypotensive (SBP 80s and 90s despite IV fluids) requiring 4 L/min. Notable labs: Glucose 117, BUN 46, creatinine 1.97 (previously 1.19 on 09/10/2020), troponin 36, WBC 18.0, Hb 10.6, COVID-19 negative. CXR with suspicion of bronchitis and hazy pneumonia in the mid and lower lungs with no dense consolidation or lobar collapse. She was given azithromycin and ceftriaxone for CAP therapy and IV fluids and hospitalist was asked to admit.  She was admitted with SOB and was found to have pneumonia with COPD exacerbation and HF.  She had septic shock from pneumonia which required pressors and ICU admission.  She's improved with antibiotics and steroids.  She received IV diuretics prior to discharge as well with cardiology.  Stable for discharge 10/23.   See below for additional details  Hospital Course:  1. Acuteon chronichypoxic respiratory failure, multifactorial: suspect 2/2 community acquired pneumonia with possible contribution from COPD exacerbation and volume overload [name]. Currently stable on home O2 (4 L [place]) b. Continue ceftriaxone/doxycycline for concern for CAP (10/19 - 10/23) c. Continue steroids, complete today d. Negative flu/covid testing,  pending blood cx, negative MRSA pcr e. Cardiology following for concern for HF, appreciate assistance -> resume home diuretics f. Regarding concern for PE, VQ scan without appreciable  perfusion defects, no findings indicative of PE g. See below for additional details   2. Septic Shock 2/2 CAP  Hypotension:  1. Continue ceftriaxone/doxycycline (5 day course) 2. Follow blood cultures, negative MRSA pcr, negative COVD and flu testing 3. Off pressors 4. Appreciate PCCM assistance, now signed off 5. CXR on admission with concern for bronchitis and pneumonia in mid and lower lungs 6. Repeat CXR 10/20 without acute findings  3. Chest Pain  CAD s/p CABG and DES:  1. Low suspicion for ACS per cardiology 2. Morphine, nitro prn  3. Continue aspirin, continue statin 4. Appreciate cardiology assistance - recommending continue asa/statin  4. Recurrent PE/DVT s/p IVC filter  Concern for Recurrent PE 1. S/p IVC filter 02/2018, not on anticoagulation 2/2 GI bleed and prior large abdominal wall hematoma 2. VQ scan without appreciable perfusion defects 3. Ultrasound negative for DVT of R and L upper extremities, chronic DVT of R popliteal vein  4. Heparin gtt stopped    5. AKI on CKD 3a, Multifactorial: hemodynamic changes, lisinopril, possibly cardio renal [name]. Hold lisinopril and spironolactone for now b. Creatinine peaked to 2.19 yesterday, imrpoving now c. UA bland   4. Acute on chronic diastolic heart failure exacerbation [name]. Appreciate cards recs  b. Resume lasix/metolazone per cards  5. COPD, possible mild exacerbation [name]. Duonebs and Albuterol  6. Hypertension: holding lisinopril/spironolactone at discharge due to low bps, follow outpatient to determine if able to resume  7. Hypothyroidism [name]. Continue synthroid  Procedures: UE [place] Summary:  No evidence of deep vein or superficial vein thrombosis involving the  right and  left upper extremities.    LE [place] Summary:  RIGHT:  - Findings consistent with chronic deep vein thrombosis involving the  right popliteal vein.  - No cystic structure found in the popliteal fossa.    LEFT:  - There is no evidence  of deep vein thrombosis in the lower extremity.    - No cystic structure found in the popliteal fossa.   Echo IMPRESSIONS    1. Diastology suggestive of impaired LV filling. Left ventricular  ejection fraction, by estimation, is 60 to 65%. The left ventricle has  normal function. The left ventricle has no regional wall motion  abnormalities. There is moderate concentric left  ventricular hypertrophy. Left ventricular diastolic parameters are  indeterminate.  2. Right ventricular systolic function is low normal. The right  ventricular size is normal. There is normal pulmonary artery systolic  pressure.  3. Left atrial size was moderately dilated.  4. Right atrial size was moderately dilated.  5. The mitral valve is myxomatous. No evidence of mitral valve  regurgitation. The mean mitral valve gradient is 5.0 mmHg with average  heart rate of 87 bpm. Moderate mitral annular calcification.  6. The aortic valve is tricuspid. Aortic valve regurgitation is not  visualized.  7. The inferior vena cava is dilated in size with <50% respiratory  variability, suggesting right atrial pressure of 15 mmHg.   Comparison(s): Minda Faas prior study was performed on 10/05/2019. No significant  change from prior study. Subtle decrease in RV function.   Consultations: Cardiology PCCM  Discharge Exam: Vitals:   10/13/20 0846 10/13/20 1136  BP:  (!) 112/56  Pulse:  90  Resp:  14  Temp:  98.4 F (36.9 C)  SpO2: 97% 97%  Feeling better Eager to discharge home Son over phone, discussed d/c plan  General: No acute distress. Cardiovascular: Heart sounds show Garrell Flagg regular rate, and rhythm Lungs: Clear to auscultation bilaterally, no appreciated wheezing  Abdomen: Soft, nontender, nondistended Neurological: Alert and oriented 3. Moves all extremities 4 . Cranial nerves II through XII grossly intact. Skin: Warm and dry. No rashes or lesions. Extremities: No clubbing or cyanosis. No edema.     Discharge Instructions   Discharge Instructions    Call MD for:  difficulty breathing, headache or visual disturbances   Complete by: As directed    Call MD for:  extreme fatigue   Complete by: As directed    Call MD for:  hives   Complete by: As directed    Call MD for:  persistant dizziness or light-headedness   Complete by: As directed    Call MD for:  persistant nausea and vomiting   Complete by: As directed    Call MD for:  redness, tenderness, or signs of infection (pain, swelling, redness, odor or green/yellow discharge around incision site)   Complete by: As directed    Call MD for:  severe uncontrolled pain   Complete by: As directed    Call MD for:  temperature >100.4   Complete by: As directed    Diet - low sodium heart healthy   Complete by: As directed    Discharge instructions   Complete by: As directed    You were seen for Jimi Giza pneumonia, COPD exacerbation, and volume overload.  You were treated with antibiotics, steroids, and diuretics.    Initially there was concern for PE, but your VQ scan was not concerning for this.  You had ultrasounds that showed Lenzy Kerschner chronic right lower extremity DVT.  We are holding your spironolactone and your lisinopril.  You can resume your diuretics with the lasix and metolazone.  Please follow up with your PCP and cardiologist within the next few days to follow up labs and determine if your blood pressure medicines can be resumed.  Return for new, recurrent, or worsening symptoms.  Please ask your PCP to request records from this hospitalization so they know what was done and what the next steps will be.   Increase activity slowly   Complete by: As directed    No wound care   Complete by: As directed      Allergies as of 10/13/2020      Reactions   Ativan [lorazepam] Other (See Comments)   Confusion    Pitavastatin Other (See Comments)   LIVALO: reaction not recalled   Ropinirole Nausea Only, Other (See Comments)   Makes legs  jump, also   Zofran [ondansetron Hcl] Nausea Only   Zolpidem Tartrate Anxiety, Other (See Comments)   CONFUSION    Penicillins Rash, Other (See Comments)   Tolerated cephalosporins in past Has patient had Rylynn Schoneman PCN reaction causing immediate rash, facial/tongue/throat swelling, SOB or lightheadedness with hypotension: Yes Has patient had Runa Whittingham PCN reaction causing severe rash involving mucus membranes or skin necrosis: No Has patient had Enez Monahan PCN reaction that required hospitalization: No Has patient had Edison Wollschlager PCN reaction occurring within the last 10 years: No If all of the above answers are "NO", then may proceed with Cephalosporin use.      Medication List    STOP taking these medications   acetaminophen 500 MG tablet Commonly known as: TYLENOL   BC HEADACHE POWDER PO   docusate sodium 100 MG capsule Commonly known as:  Colace   lisinopril 2.5 MG tablet Commonly known as: ZESTRIL   Oxycodone HCl 10 MG Tabs   potassium chloride 10 MEQ tablet Commonly known as: KLOR-CON   rivaroxaban 10 MG Tabs tablet Commonly known as: Xarelto   spironolactone 25 MG tablet Commonly known as: ALDACTONE     TAKE these medications   allopurinol 100 MG tablet Commonly known as: ZYLOPRIM Take 100 mg by mouth daily.   ARIPiprazole 2 MG tablet Commonly known as: ABILIFY Take 2 mg by mouth daily.   ascorbic acid 500 MG tablet Commonly known as: VITAMIN C Take 500 mg by mouth daily.   aspirin 81 MG chewable tablet Chew 1 tablet (81 mg total) by mouth daily. Start taking on: October 14, 2020   atorvastatin 20 MG tablet Commonly known as: LIPITOR Take 20 mg by mouth daily.   citalopram 40 MG tablet Commonly known as: CELEXA Take 40 mg by mouth at bedtime.   cyclobenzaprine 10 MG tablet Commonly known as: FLEXERIL Take 10 mg by mouth 3 (three) times daily as needed for muscle spasms.   ferrous sulfate 325 (65 FE) MG tablet Commonly known as: FerrouSul Take 1 tablet (325 mg total) by mouth  3 (three) times daily with meals for 14 days. What changed: when to take this   furosemide 40 MG tablet Commonly known as: LASIX Take two tablets by mouth every Monday and Thursday afternoon.  Take one tablet all other afternoons. What changed:   how much to take  how to take this  when to take this  additional instructions   gabapentin 100 MG capsule Commonly known as: NEURONTIN Take 100 mg by mouth daily.   guaiFENesin 600 MG 12 hr tablet Commonly known as: MUCINEX Take 600 mg by mouth 2 (two) times daily.   Incruse Ellipta 62.5 MCG/INH Aepb Generic drug: umeclidinium bromide Inhale 1 puff into the lungs 2 (two) times daily.   Ipratropium-Albuterol 20-100 MCG/ACT Aers respimat Commonly known as: COMBIVENT Inhale 1 puff into the lungs every 6 (six) hours as needed for wheezing or shortness of breath (or to help expel mucous).   levothyroxine 50 MCG tablet Commonly known as: SYNTHROID Take 1 tablet (50 mcg total) by mouth daily at 6 (six) AM.   magnesium oxide 400 (241.3 Mg) MG tablet Commonly known as: MAG-OX Take 1 tablet (400 mg total) by mouth 2 (two) times daily.   Melatonin 10 MG Tabs Take 10 mg by mouth at bedtime.   metolazone 2.5 MG tablet Commonly known as: ZAROXOLYN Take 2.5 mg by mouth at bedtime.   neomycin-polymyxin-hydrocortisone 3.5-10000-1 OTIC suspension Commonly known as: CORTISPORIN Place 5 drops into the right ear daily as needed (ear pain).   nitroGLYCERIN 0.4 MG SL tablet Commonly known as: NITROSTAT Place 1 tablet (0.4 mg total) under the tongue every 5 (five) minutes as needed for chest pain. For chest pain   ofloxacin 0.3 % OTIC solution Commonly known as: FLOXIN Place 2 drops into the left ear daily.   omeprazole 40 MG capsule Commonly known as: PRILOSEC Take 1 capsule (40 mg total) by mouth 2 (two) times daily. What changed: when to take this   OXYGEN Inhale 4 L/min into the lungs as needed (DURING ALL TIMES OF EXERTION).    pilocarpine 1 % ophthalmic solution Commonly known as: PILOCAR Place 1 drop into both eyes 2 (two) times daily.   polyethylene glycol 17 g packet Commonly known as: MIRALAX / GLYCOLAX Take 17 g by mouth 2 (two)  times daily. What changed: when to take this   potassium chloride SA 20 MEQ tablet Commonly known as: KLOR-CON Take 20 mEq by mouth daily.   pramipexole 0.25 MG tablet Commonly known as: MIRAPEX Take 0.25 mg by mouth every evening.   roflumilast 500 MCG Tabs tablet Commonly known as: DALIRESP Take 500 mcg by mouth every evening.   Spiriva Respimat 1.25 MCG/ACT Aers Generic drug: Tiotropium Bromide Monohydrate Inhale 2 puffs into the lungs daily.   traZODone 50 MG tablet Commonly known as: DESYREL Take 150 mg by mouth at bedtime.   Xtampza ER 9 MG C12a Generic drug: oxyCODONE ER Take 9 mg by mouth every 8 (eight) hours.      Allergies  Allergen Reactions  . Ativan [Lorazepam] Other (See Comments)    Confusion   . Pitavastatin Other (See Comments)    LIVALO: reaction not recalled  . Ropinirole Nausea Only and Other (See Comments)    Makes legs jump, also  . Zofran [Ondansetron Hcl] Nausea Only  . Zolpidem Tartrate Anxiety and Other (See Comments)    CONFUSION   . Penicillins Rash and Other (See Comments)    Tolerated cephalosporins in past Has patient had Davon Folta PCN reaction causing immediate rash, facial/tongue/throat swelling, SOB or lightheadedness with hypotension: Yes Has patient had Anayiah Howden PCN reaction causing severe rash involving mucus membranes or skin necrosis: No Has patient had Earlisha Sharples PCN reaction that required hospitalization: No Has patient had Koichi Platte PCN reaction occurring within the last 10 years: No If all of the above answers are "NO", then may proceed with Cephalosporin use.     Follow-up Information    [name], MD Follow up in 1 week(s).   Specialty: Internal Medicine Contact information: [address] [place]  [address] [phone]        Fay Records, MD .   Specialty: Cardiology Contact information: [place] [address] [phone]        [facility] Follow up.   Specialty: [place] Why: agency will provide home health therapy, nurse, aide and socil work Contact information: [place] [address] [phone]                The results of significant diagnostics from this hospitalization (including imaging, microbiology, ancillary and laboratory) are listed below for reference.    Significant Diagnostic Studies: NM Pulmonary Perfusion  Result Date: 10/11/2020 CLINICAL DATA:  Shortness of breath and chest pain EXAM: NUCLEAR MEDICINE PERFUSION LUNG SCAN TECHNIQUE: Perfusion images were obtained in multiple projections after intravenous injection of radiopharmaceutical. Views: Anterior, posterior, RPO, LPO, RAO, LAO RADIOPHARMACEUTICALS:  4.2 mCi Tc-5m MAA IV COMPARISON:  Chest radiograph October 10, 2020 FINDINGS: Distribution of radiotracer uptake is homogeneous and symmetric bilaterally. No appreciable perfusion defects. IMPRESSION: No appreciable perfusion defects. No findings indicative of pulmonary embolus. Electronically Signed   By: Lowella Grip III M.D.   On: 10/11/2020 11:07   DG CHEST PORT 1 VIEW  Result Date: 10/10/2020 CLINICAL DATA:  Dyspnea. EXAM: PORTABLE CHEST 1 VIEW COMPARISON:  October 09, 2020. FINDINGS: Stable cardiomediastinal silhouette. Status post coronary bypass graft. No pneumothorax or pleural effusion is noted. No acute pulmonary disease is noted. Status post right shoulder arthroplasty. IMPRESSION: No active disease. Aortic Atherosclerosis (ICD10-I70.0). Electronically Signed   By: Marijo Conception M.D.   On: 10/10/2020 10:13   DG Chest Port 1 View  Result Date: 10/09/2020  CLINICAL DATA:  Shortness of breath, cough and fever. EXAM: PORTABLE CHEST 1 VIEW  COMPARISON:  10/05/2019 FINDINGS: Previous median sternotomy and CABG. Mild cardiomegaly. Aortic atherosclerosis. Bilateral breast implants with density overlying the chest. Suspicion of bronchitis and hazy pneumonia in the mid and lower lungs. No dense consolidation, lobar collapse or effusion. Old shoulder replacement on the right. Chronic degenerative changes of the left shoulder. IMPRESSION: 1. Suspicion of bronchitis and hazy pneumonia in the mid and lower lungs. No dense consolidation or lobar collapse. 2. Previous CABG. Mild cardiomegaly. Aortic atherosclerosis. Electronically Signed   By: Nelson Chimes M.D.   On: 10/09/2020 13:15   ECHOCARDIOGRAM COMPLETE  Result Date: 10/09/2020    ECHOCARDIOGRAM REPORT   Patient Name:   [name] Date of Exam: 10/09/2020 Medical Rec #:  [id number]      Height:       68.0 in Accession #:    [id number]     Weight:       167.5 lb Date of Birth:  26-Apr-1941       BSA:          1.896 m Patient Age:    66 years       BP:           92/42 mmHg Patient Gender: F              HR:           88 bpm. Exam Location:  Inpatient Procedure: Cardiac Doppler, Color Doppler, 2D Echo and 3D Echo STAT ECHO Indications:    R07.9* Chest pain, unspecified  History:        Patient has prior history of Echocardiogram examinations, most                 recent 10/05/2019. CAD, Abnormal ECG and Prior CABG, COPD,                 Signs/Symptoms:Dyspnea; Risk Factors:Sleep Apnea and                 Dyslipidemia. Hypoxia. Pulmonary embolus.  Sonographer:    Roseanna Rainbow RDCS Referring Phys: [id number] [address]  1. Diastology suggestive of impaired LV filling. Left ventricular ejection fraction, by estimation, is 60 to 65%. The left ventricle has normal function. The left ventricle has no regional wall motion abnormalities. There is moderate concentric left ventricular hypertrophy. Left ventricular diastolic parameters are indeterminate.  2. Right ventricular systolic function is low  normal. The right ventricular size is normal. There is normal pulmonary artery systolic pressure.  3. Left atrial size was moderately dilated.  4. Right atrial size was moderately dilated.  5. The mitral valve is myxomatous. No evidence of mitral valve regurgitation. The mean mitral valve gradient is 5.0 mmHg with average heart rate of 87 bpm. Moderate mitral annular calcification.  6. The aortic valve is tricuspid. Aortic valve regurgitation is not visualized.  7. The inferior vena cava is dilated in size with <50% respiratory variability, suggesting right atrial pressure of 15 mmHg. Comparison(s): Amisha Pospisil prior study was performed on 10/05/2019. No significant change from prior study. Subtle decrease in RV function. FINDINGS  Left Ventricle: Diastology suggestive of impaired LV filling. Left ventricular ejection fraction, by estimation, is 60 to 65%. The left ventricle has normal function. The left ventricle has no regional wall motion abnormalities. The left ventricular internal cavity size was normal in size. There is moderate concentric left ventricular hypertrophy. Left ventricular diastolic parameters are  indeterminate. Right Ventricle: The right ventricular size is normal. Right vetricular wall thickness was not well visualized. Right ventricular systolic function is low normal. There is normal pulmonary artery systolic pressure. The tricuspid regurgitant velocity is 2.30 m/s, and with an assumed right atrial pressure of 3 mmHg, the estimated right ventricular systolic pressure is 64.4 mmHg. Left Atrium: Left atrial size was moderately dilated. Right Atrium: Right atrial size was moderately dilated. Pericardium: There is no evidence of pericardial effusion. Mitral Valve: The mitral valve is myxomatous. There is mild calcification of the mitral valve leaflet(s). Moderate mitral annular calcification. No evidence of mitral valve regurgitation. MV peak gradient, 25.4 mmHg. The mean mitral valve gradient is 5.0  mmHg  with average heart rate of 87 bpm. Tricuspid Valve: The tricuspid valve is grossly normal. Tricuspid valve regurgitation is trivial. Aortic Valve: The aortic valve is tricuspid. Aortic valve regurgitation is not visualized. Pulmonic Valve: The pulmonic valve was not well visualized. Pulmonic valve regurgitation is trivial. Aorta: The aortic root and ascending aorta are structurally normal, with no evidence of dilitation. Venous: The inferior vena cava is dilated in size with less than 50% respiratory variability, suggesting right atrial pressure of 15 mmHg. IAS/Shunts: The interatrial septum appears to be lipomatous. The atrial septum is grossly normal.  LEFT VENTRICLE PLAX 2D LVIDd:         4.60 cm     Diastology LVIDs:         3.00 cm     LV e' medial:    5.87 cm/s LV PW:         1.40 cm     LV E/e' medial:  27.1 LV IVS:        1.40 cm     LV e' lateral:   8.21 cm/s LVOT diam:     1.70 cm     LV E/e' lateral: 19.4 LV SV:         62 LV SV Index:   33 LVOT Area:     2.27 cm  LV Volumes (MOD) LV vol d, MOD A2C: 99.9 ml LV vol d, MOD A4C: 71.4 ml LV vol s, MOD A2C: 35.9 ml LV vol s, MOD A4C: 26.8 ml LV SV MOD A2C:     64.0 ml LV SV MOD A4C:     71.4 ml LV SV MOD BP:      56.6 ml RIGHT VENTRICLE            IVC RV S prime:     8.08 cm/s  IVC diam: 3.00 cm TAPSE (M-mode): 1.4 cm LEFT ATRIUM             Index       RIGHT ATRIUM           Index LA diam:        3.80 cm 2.00 cm/m  RA Area:     17.30 cm LA Vol (A2C):   56.3 ml 29.70 ml/m RA Volume:   44.90 ml  23.69 ml/m LA Vol (A4C):   44.9 ml 23.69 ml/m LA Biplane Vol: 55.6 ml 29.33 ml/m  AORTIC VALVE LVOT Vmax:   129.00 cm/s LVOT Vmean:  86.900 cm/s LVOT VTI:    0.272 m  AORTA Ao Root diam: 3.40 cm Ao Asc diam:  3.40 cm MITRAL VALVE                TRICUSPID VALVE MV Area (PHT): 4.15 cm     TR Peak grad:  21.2 mmHg MV Peak grad:  25.4 mmHg    TR Vmax:        230.00 cm/s MV Mean grad:  5.0 mmHg MV Vmax:       2.52 m/s     SHUNTS MV Vmean:      88.8 cm/s    Systemic  VTI:  0.27 m MV Decel Time: 183 msec     Systemic Diam: 1.70 cm MV E velocity: 159.00 cm/s MV Irja Wheless velocity: 209.00 cm/s MV E/Sulema Braid ratio:  0.76 Rudean Haskell MD Electronically signed by Rudean Haskell MD Signature Date/Time: 10/09/2020/4:38:55 PM    Final    VAS [place] LOWER EXTREMITY VENOUS (DVT)  Result Date: 10/10/2020  Lower Venous DVTStudy Indications: Elevated ddimer.  Comparison Study: previous 11/03/18 Performing Technologist: Abram Sander RVS  Examination Guidelines: Chrislyn Seedorf complete evaluation includes B-mode imaging, spectral Doppler, color Doppler, and power Doppler as needed of all accessible portions of each vessel. Bilateral testing is considered an integral part of Jazir Newey complete examination. Limited examinations for reoccurring indications may be performed as noted. The reflux portion of the exam is performed with the patient in reverse Trendelenburg.  +---------+---------------+---------+-----------+----------+--------------+ RIGHT    CompressibilityPhasicitySpontaneityPropertiesThrombus Aging +---------+---------------+---------+-----------+----------+--------------+ CFV      Full           Yes      Yes                                 +---------+---------------+---------+-----------+----------+--------------+ SFJ      Full                                                        +---------+---------------+---------+-----------+----------+--------------+ FV Prox  Full                                                        +---------+---------------+---------+-----------+----------+--------------+ FV Mid   Full                                                        +---------+---------------+---------+-----------+----------+--------------+ FV DistalFull                                                        +---------+---------------+---------+-----------+----------+--------------+ PFV      Full                                                         +---------+---------------+---------+-----------+----------+--------------+ POP      None           Yes      Yes  Chronic        +---------+---------------+---------+-----------+----------+--------------+ PTV      Full                                                        +---------+---------------+---------+-----------+----------+--------------+ PERO     Full                                                        +---------+---------------+---------+-----------+----------+--------------+   +---------+---------------+---------+-----------+----------+--------------+ LEFT     CompressibilityPhasicitySpontaneityPropertiesThrombus Aging +---------+---------------+---------+-----------+----------+--------------+ CFV      Full           Yes      Yes                                 +---------+---------------+---------+-----------+----------+--------------+ SFJ      Full                                                        +---------+---------------+---------+-----------+----------+--------------+ FV Prox  Full                                                        +---------+---------------+---------+-----------+----------+--------------+ FV Mid   Full                                                        +---------+---------------+---------+-----------+----------+--------------+ FV DistalFull                                                        +---------+---------------+---------+-----------+----------+--------------+ PFV      Full                                                        +---------+---------------+---------+-----------+----------+--------------+ POP      Full           Yes      Yes                                 +---------+---------------+---------+-----------+----------+--------------+ PTV      Full                                                         +---------+---------------+---------+-----------+----------+--------------+  PERO     Full                                                        +---------+---------------+---------+-----------+----------+--------------+     Summary: RIGHT: - Findings consistent with chronic deep vein thrombosis involving the right popliteal vein. - No cystic structure found in the popliteal fossa.  LEFT: - There is no evidence of deep vein thrombosis in the lower extremity.  - No cystic structure found in the popliteal fossa.  *See table(s) above for measurements and observations. Electronically signed by Harold Barban MD on 10/10/2020 at 7:40:41 PM.    Final    VAS [place] UPPER EXTREMITY VENOUS DUPLEX  Result Date: 10/10/2020 UPPER VENOUS STUDY  Indications: elevated ddimer Comparison Study: no prior Performing Technologist: Abram Sander RVS  Examination Guidelines: Bellah Alia complete evaluation includes B-mode imaging, spectral Doppler, color Doppler, and power Doppler as needed of all accessible portions of each vessel. Bilateral testing is considered an integral part of Karsin Pesta complete examination. Limited examinations for reoccurring indications may be performed as noted.  Right Findings: +----------+------------+---------+-----------+----------+-------+ RIGHT     CompressiblePhasicitySpontaneousPropertiesSummary +----------+------------+---------+-----------+----------+-------+ IJV           Full       Yes       Yes                      +----------+------------+---------+-----------+----------+-------+ Subclavian    Full       Yes       Yes                      +----------+------------+---------+-----------+----------+-------+ Axillary      Full       Yes       Yes                      +----------+------------+---------+-----------+----------+-------+ Brachial      Full       Yes       Yes                      +----------+------------+---------+-----------+----------+-------+ Radial        Full                                           +----------+------------+---------+-----------+----------+-------+ Ulnar         Full                                          +----------+------------+---------+-----------+----------+-------+ Cephalic      Full                                          +----------+------------+---------+-----------+----------+-------+ Basilic       Full                                          +----------+------------+---------+-----------+----------+-------+  Left Findings: +----------+------------+---------+-----------+----------+-------+ LEFT      CompressiblePhasicitySpontaneousPropertiesSummary +----------+------------+---------+-----------+----------+-------+ IJV           Full       Yes       Yes                      +----------+------------+---------+-----------+----------+-------+ Subclavian    Full       Yes       Yes                      +----------+------------+---------+-----------+----------+-------+ Axillary      Full       Yes       Yes                      +----------+------------+---------+-----------+----------+-------+ Brachial      Full       Yes       Yes                      +----------+------------+---------+-----------+----------+-------+ Radial        Full                                          +----------+------------+---------+-----------+----------+-------+ Ulnar         Full                                          +----------+------------+---------+-----------+----------+-------+ Cephalic      Full                                          +----------+------------+---------+-----------+----------+-------+ Basilic       Full                                          +----------+------------+---------+-----------+----------+-------+  Summary: No evidence of deep vein or superficial vein thrombosis involving the right and left upper extremities.  *See table(s) above for  measurements and observations.  Diagnosing physician: Harold Barban MD Electronically signed by Harold Barban MD on 10/10/2020 at 7:40:28 PM.    Final     Microbiology: Recent Results (from the past 240 hour(s))  Respiratory Panel by RT PCR (Flu Michalla Ringer&B, Covid) - Nasopharyngeal Swab     Status: None   Collection Time: 10/09/20 12:11 PM   Specimen: Nasopharyngeal Swab  Result Value Ref Range Status   SARS Coronavirus 2 by RT PCR NEGATIVE NEGATIVE Final    Comment: (NOTE) SARS-CoV-2 target nucleic acids are NOT DETECTED.  The SARS-CoV-2 RNA is generally detectable in upper respiratoy specimens during the acute phase of infection. The lowest concentration of SARS-CoV-2 viral copies this assay can detect is 131 copies/mL. Lukisha Procida negative result does not preclude SARS-Cov-2 infection and should not be used as the sole basis for treatment or other patient management decisions. Armilda Vanderlinden negative result may occur with  improper specimen collection/handling, submission of specimen other than nasopharyngeal swab, presence of viral mutation(s) within the areas targeted by this assay, and inadequate number of viral copies (<131 copies/mL). Keddrick Wyne negative  result must be combined with clinical observations, patient history, and epidemiological information. The expected result is Negative.  Fact Sheet for Patients:  PinkCheek.be  Fact Sheet for Healthcare Providers:  GravelBags.it  This test is no t yet approved or cleared by the [place] FDA and  has been authorized for detection and/or diagnosis of SARS-CoV-2 by FDA under an Emergency Use Authorization (EUA). This EUA will remain  in effect (meaning this test can be used) for the duration of the COVID-19 declaration under Section 564(b)(1) of the Act, 21 U.S.C. section 360bbb-3(b)(1), unless the authorization is terminated or revoked sooner.     Influenza Lizbet Cirrincione by PCR NEGATIVE NEGATIVE Final    Influenza B by PCR NEGATIVE NEGATIVE Final    Comment: (NOTE) The Xpert Xpress SARS-CoV-2/FLU/RSV assay is intended as an aid in  the diagnosis of influenza from Nasopharyngeal swab specimens and  should not be used as Doralene Glanz sole basis for treatment. Nasal washings and  aspirates are unacceptable for Xpert Xpress SARS-CoV-2/FLU/RSV  testing.  Fact Sheet for Patients: PinkCheek.be  Fact Sheet for Healthcare Providers: GravelBags.it  This test is not yet approved or cleared by the [place] FDA and  has been authorized for detection and/or diagnosis of SARS-CoV-2 by  FDA under an Emergency Use Authorization (EUA). This EUA will remain  in effect (meaning this test can be used) for the duration of the  Covid-19 declaration under Section 564(b)(1) of the Act, 21  U.S.C. section 360bbb-3(b)(1), unless the authorization is  terminated or revoked. Performed at [facility], [place] [address] [place], [place] [address]   Culture, blood (routine x 2)     Status: None (Preliminary result)   Collection Time: 10/09/20 12:43 PM   Specimen: BLOOD  Result Value Ref Range Status   Specimen Description   Final    BLOOD LEFT ANTECUBITAL Performed at [facility] [address], [place], [place] [address]    Special Requests   Final    BOTTLES DRAWN AEROBIC AND ANAEROBIC Blood Culture adequate volume Performed at [facility] [address], [place], [place] [address]    Culture   Final    NO GROWTH 4 DAYS Performed at [facility], [place] [address], [place], [place] [address]    Report Status PENDING  Incomplete  Culture, blood (routine x 2)     Status: None (Preliminary result)   Collection Time: 10/09/20 12:48 PM   Specimen: BLOOD  Result Value Ref Range Status   Specimen Description   Final    BLOOD RIGHT ANTECUBITAL Performed at [facility]  [address] [place], [place] [address]    Special Requests   Final    BOTTLES DRAWN AEROBIC AND ANAEROBIC Blood Culture results may not be optimal due to an inadequate volume of blood received in culture bottles Performed at [facility] [address] [place], [place] [address]    Culture   Final    NO GROWTH 4 DAYS Performed at [facility], [place] [address], [place], [place] [address]    Report Status PENDING  Incomplete  Culture, Urine     Status: Abnormal   Collection Time: 10/09/20  6:35 PM   Specimen: Urine, Clean Catch  Result Value Ref Range Status   Specimen Description   Final    URINE, CLEAN CATCH Performed at [facility], [place] [address] [place], [place] [address]    Special  Requests   Final    NONE Performed at [facility], [place] [address] [place], [place] [address]    Culture (Manuella Blackson)  Final    <10,000 COLONIES/mL INSIGNIFICANT GROWTH Performed at [facility] [address], [place], [place] [address]    Report Status 10/12/2020 FINAL  Final  MRSA PCR Screening     Status: None   Collection Time: 10/10/20  1:45 AM   Specimen: Nasal Mucosa; Nasopharyngeal  Result Value Ref Range Status   MRSA by PCR NEGATIVE NEGATIVE Final    Comment:        The GeneXpert MRSA Assay (FDA approved for NASAL specimens only), is one component of Ashya Nicolaisen comprehensive MRSA colonization surveillance program. It is not intended to diagnose MRSA infection nor to guide or monitor treatment for MRSA infections. Performed at [facility], [place] [address] [place], [place] [address]      Labs: Basic Metabolic Panel: Recent Labs  Lab 10/09/20 1141 10/10/20 0245 10/11/20 0300 10/13/20 0444  NA 138 133* 137 142  K 4.1 3.8 4.0 4.2  CL 97* 97* 102 101  CO2 28 24 25 29   GLUCOSE 117* 197* 139* 88  BUN 46* 52* 47* 30*  CREATININE 1.97* 2.19* 1.42* 0.95  CALCIUM 9.2 8.2* 8.6* 9.0  MG  --   --  2.5*  2.1  PHOS  --   --  2.8 3.6   Liver Function Tests: Recent Labs  Lab 10/11/20 0300 10/13/20 0444  AST 43* 20  ALT 30 26  ALKPHOS 74 62  BILITOT 0.5 0.5  PROT 6.2* 5.8*  ALBUMIN 2.4* 2.4*   No results for input(s): LIPASE, AMYLASE in the last 168 hours. No results for input(s): AMMONIA in the last 168 hours. CBC: Recent Labs  Lab 10/09/20 1141 10/10/20 0245 10/11/20 0300 10/12/20 0341 10/13/20 0444  WBC 18.0* 16.4* 36.9* 17.1* 13.4*  NEUTROABS  --   --   --   --  9.3*  HGB 10.6* 9.2* 10.5* 10.8* 10.2*  HCT 33.5* 29.0* 33.0* 35.0* 32.7*  MCV 93.8 94.2 91.7 94.9 95.9  PLT 280 249 405* 324 314   Cardiac Enzymes: No results for input(s): CKTOTAL, CKMB, CKMBINDEX, TROPONINI in the last 168 hours. BNP: BNP (last 3 results) Recent Labs    10/09/20 1141  BNP 491.4*    ProBNP (last 3 results) Recent Labs    03/08/20 1158 09/10/20 1535  PROBNP 1,185* 745*    CBG: No results for input(s): GLUCAP in the last 168 hours.     Signed:  Fayrene Helper MD.  [facility] 10/13/2020, 5:03 PM

## 2020-10-13 NOTE — Discharge Instructions (Signed)
Community-Acquired Pneumonia, Adult Pneumonia is an infection of the lungs. It causes swelling in the airways of the lungs. Mucus and fluid may also build up inside the airways. One type of pneumonia can happen while a person is in a hospital. A different type can happen when a person is not in a hospital (community-acquired pneumonia).  What are the causes?  This condition is caused by germs (viruses, bacteria, or fungi). Some types of germs can be passed from one person to another. This can happen when you breathe in droplets from the cough or sneeze of an infected person. What increases the risk? You are more likely to develop this condition if you:  Have a long-term (chronic) disease, such as: ? Chronic obstructive pulmonary disease (COPD). ? Asthma. ? Cystic fibrosis. ? Congestive heart failure. ? Diabetes. ? Kidney disease.  Have HIV.  Have sickle cell disease.  Have had your spleen removed.  Do not take good care of your teeth and mouth (poor dental hygiene).  Have a medical condition that increases the risk of breathing in droplets from your own mouth and nose.  Have a weakened body defense system (immune system).  Are a smoker.  Travel to areas where the germs that cause this illness are common.  Are around certain animals or the places they live. What are the signs or symptoms?  A dry cough.  A wet (productive) cough.  Fever.  Sweating.  Chest pain. This often happens when breathing deeply or coughing.  Fast breathing or trouble breathing.  Shortness of breath.  Shaking chills.  Feeling tired (fatigue).  Muscle aches. How is this treated? Treatment for this condition depends on many things. Most adults can be treated at home. In some cases, treatment must happen in a hospital. Treatment may include:  Medicines given by mouth or through an IV tube.  Being given extra oxygen.  Respiratory therapy. In rare cases, treatment for very bad pneumonia  may include:  Using a machine to help you breathe.  Having a procedure to remove fluid from around your lungs. Follow these instructions at home: Medicines  Take over-the-counter and prescription medicines only as told by your doctor. ? Only take cough medicine if you are losing sleep.  If you were prescribed an antibiotic medicine, take it as told by your doctor. Do not stop taking the antibiotic even if you start to feel better. General instructions   Sleep with your head and neck raised (elevated). You can do this by sleeping in a recliner or by putting a few pillows under your head.  Rest as needed. Get at least 8 hours of sleep each night.  Drink enough water to keep your pee (urine) pale yellow.  Eat a healthy diet that includes plenty of vegetables, fruits, whole grains, low-fat dairy products, and lean protein.  Do not use any products that contain nicotine or tobacco. These include cigarettes, e-cigarettes, and chewing tobacco. If you need help quitting, ask your doctor.  Keep all follow-up visits as told by your doctor. This is important. How is this prevented? A shot (vaccine) can help prevent pneumonia. Shots are often suggested for:  People older than 80 years of age.  People older than 80 years of age who: ? Are having cancer treatment. ? Have long-term (chronic) lung disease. ? Have problems with their body's defense system. You may also prevent pneumonia if you take these actions:  Get the flu (influenza) shot every year.  Go to the dentist as   often as told.  Wash your hands often. If you cannot use soap and water, use hand sanitizer. Contact a doctor if:  You have a fever.  You lose sleep because your cough medicine does not help. Get help right away if:  You are short of breath and it gets worse.  You have more chest pain.  Your sickness gets worse. This is very serious if: ? You are an older adult. ? Your body's defense system is weak.  You  cough up blood. Summary  Pneumonia is an infection of the lungs.  Most adults can be treated at home. Some will need treatment in a hospital.  Drink enough water to keep your pee pale yellow.  Get at least 8 hours of sleep each night. This information is not intended to replace advice given to you by your health care provider. Make sure you discuss any questions you have with your health care provider. Document Revised: 03/30/2019 Document Reviewed: 08/05/2018 Elsevier Patient Education  2020 [employer]  

## 2020-10-13 NOTE — Progress Notes (Signed)
Patient discharged home with [facility] in place per TOC.  IVs removed- WNL.  Reviewed AVS and medications - verbalizes understanding.  Instructed to follow up with PCP in 1-2 weeks or call for recurrent/worsening symptoms.  No questions at this time. Patient in NAD at time of discharge

## 2020-10-13 NOTE — Progress Notes (Signed)
Progress Note  Patient Name: [name] Date of Encounter: 10/13/2020  Primary Cardiologist: Dorris Carnes, MD   Subjective   Feeling much better today. No chest pain, breathing improved.  Inpatient Medications    Scheduled Meds: . allopurinol  100 mg Oral Daily  . arformoterol  15 mcg Nebulization BID  . ARIPiprazole  2 mg Oral Daily  . ascorbic acid  500 mg Oral Daily  . aspirin  81 mg Oral Daily  . atorvastatin  10 mg Oral Daily  . budesonide (PULMICORT) nebulizer solution  0.5 mg Nebulization BID  . chlorhexidine  15 mL Mouth Rinse BID  . Chlorhexidine Gluconate Cloth  6 each Topical Daily  . citalopram  40 mg Oral QHS  . doxycycline  100 mg Oral Q12H  . enoxaparin (LOVENOX) injection  40 mg Subcutaneous Q24H  . ferrous sulfate  325 mg Oral Q breakfast  . furosemide  40 mg Oral Daily  . gabapentin  100 mg Oral Daily  . ipratropium-albuterol  3 mL Nebulization BID  . levothyroxine  50 mcg Oral Q0600  . magnesium oxide  400 mg Oral BID  . mouth rinse  15 mL Mouth Rinse q12n4p  . melatonin  10 mg Oral QHS  . pantoprazole  40 mg Oral QHS  . pilocarpine  1 drop Both Eyes BID  . polyethylene glycol  17 g Oral Daily  . pramipexole  0.25 mg Oral QPM  . predniSONE  40 mg Oral Q breakfast  . roflumilast  500 mcg Oral QPM  . sodium chloride flush  3 mL Intravenous Q12H  . traZODone  150 mg Oral QHS   Continuous Infusions: . sodium chloride 250 mL (10/12/20 1702)  . cefTRIAXone (ROCEPHIN)  IV 2 g (10/12/20 1703)   PRN Meds: acetaminophen **OR** acetaminophen, albuterol, benzonatate, guaiFENesin, morphine injection, nitroGLYCERIN   Vital Signs    Vitals:   10/13/20 0500 10/13/20 0546 10/13/20 0846 10/13/20 1136  BP:  (!) 103/56  (!) 112/56  Pulse:  79  90  Resp:  16  14  Temp:  98.5 F (36.9 C)  98.4 F (36.9 C)  TempSrc:  Oral    SpO2:  100% 97% 97%  Weight: 77.8 kg     Height:        Intake/Output Summary (Last 24 hours) at 10/13/2020 1209 Last data  filed at 10/13/2020 0400 Gross per 24 hour  Intake 1200 ml  Output 400 ml  Net 800 ml   Filed Weights   10/11/20 0406 10/12/20 0500 10/13/20 0500  Weight: 81.3 kg 80.7 kg 77.8 kg    Telemetry    Predominantly sinus rhythm, with brief episodes of afib and intermittent PACs/PVCs - Personally Reviewed  ECG    No new tracings - Personally Reviewed  Physical Exam   GEN: Well nourished, well developed in no acute distress NECK: No JVD CARDIAC: regular rhythm with occasional early beat, normal S1 and S2, no rubs or gallops. No murmur. VASCULAR: Radial pulses 2+ bilaterally.  RESPIRATORY:  Largely clear with scattered rhonchi  ABDOMEN: Soft, non-tender, non-distended MUSCULOSKELETAL:  Moves all 4 limbs independently SKIN: Warm and dry, no edema NEUROLOGIC:  No focal neuro deficits noted. PSYCHIATRIC:  Normal affect   Labs    Chemistry Recent Labs  Lab 10/10/20 0245 10/11/20 0300 10/13/20 0444  NA 133* 137 142  K 3.8 4.0 4.2  CL 97* 102 101  CO2 24 25 29   GLUCOSE 197* 139* 88  BUN 52* 47*  30*  CREATININE 2.19* 1.42* 0.95  CALCIUM 8.2* 8.6* 9.0  PROT  --  6.2* 5.8*  ALBUMIN  --  2.4* 2.4*  AST  --  43* 20  ALT  --  30 26  ALKPHOS  --  74 62  BILITOT  --  0.5 0.5  GFRNONAA 21* 35* >60  ANIONGAP 12 10 12      Hematology Recent Labs  Lab 10/11/20 0300 10/12/20 0341 10/13/20 0444  WBC 36.9* 17.1* 13.4*  RBC 3.60* 3.69* 3.41*  HGB 10.5* 10.8* 10.2*  HCT 33.0* 35.0* 32.7*  MCV 91.7 94.9 95.9  MCH 29.2 29.3 29.9  MCHC 31.8 30.9 31.2  RDW 15.4 15.6* 15.8*  PLT 405* 324 314    Cardiac EnzymesNo results for input(s): TROPONINI in the last 168 hours. No results for input(s): TROPIPOC in the last 168 hours.   BNP Recent Labs  Lab 10/09/20 1141  BNP 491.4*     DDimer  Recent Labs  Lab 10/09/20 1141  DDIMER 8.76*     Radiology    No results found.  Cardiac Studies   Echocardiogram 10/09/2020: Impressions: 1. Diastology suggestive of impaired  LV filling. Left ventricular  ejection fraction, by estimation, is 60 to 65%. The left ventricle has  normal function. The left ventricle has no regional wall motion  abnormalities. There is moderate concentric left  ventricular hypertrophy. Left ventricular diastolic parameters are  indeterminate.  2. Right ventricular systolic function is low normal. The right  ventricular size is normal. There is normal pulmonary artery systolic  pressure.  3. Left atrial size was moderately dilated.  4. Right atrial size was moderately dilated.  5. The mitral valve is myxomatous. No evidence of mitral valve  regurgitation. The mean mitral valve gradient is 5.0 mmHg with average  heart rate of 87 bpm. Moderate mitral annular calcification.  6. The aortic valve is tricuspid. Aortic valve regurgitation is not  visualized.  7. The inferior vena cava is dilated in size with <50% respiratory  variability, suggesting right atrial pressure of 15 mmHg.   Comparison(s): A prior study was performed on 10/05/2019. No significant  change from prior study. Subtle decrease in RV function.   Patient Profile     79 y.o.femalewith a history of CAD s/p CABG in 2004 with subsequent DES to left main in 9629, chronic diastolic CHF, carotid artery disease, PAD s/p SMA stenting in 2019, recurrent DVT/PE s/p IVC filter in3/2019not on anticoagulation due to GI bleed and prior large abdominal wall hematoma, COPD on home O2, cirrhosis, hypertension, hyperlipidemia, hyperthyroidism, and depression who presented with worsening SOB, fevers and chest pain found to have COPD exacerbation, pneumonia and volume overload.  Assessment & Plan    Acute on chronic hypoxic respiratory failure:  -thought to be multifactorial given known COPD on 4L home O2, pneumonia, and acute on chronic diastolic heart failure -renal function continues to improve, received IV lasix yesterday -COPD/pneumonia treatment per primary team  Acute on  chronic diastolic CHF:  -EF preserved. -weight today 77.8 kg, peak 81.3 kg this admission -will change to oral lasix today -continue home diuretic regimen at discharge  CAD s/p CABG in 2004 with subsequent PCI/DES to LM in 2010:  -troponin trend not consistent with acute coronary syndrome -likely demand ischemic -Continue statin -started on aspirin this admission, would continue if no plans for anticoagulation  Recurrent PE/DVT s/p IVC filter: IVC filter placed in 2019 due to history of GI bleed and abdominal wall hematoma on anticoagulation  in the past.   -no plans for anticoagulation -reports that she was not taking rivaroxaban prior to admission  HTN:  -no longer requiring pressors, but BP remains low normal -would hold on adding back additional medications today  Acute on chronic renal insufficiency: Improving -lisinopril currently on hold  [facility] will sign off.   Medication Recommendations:   Continue home lasix/metolazone regimen at discharge Can restart lisinopril/spironolactone if BP and renal function allows Other recommendations (labs, testing, etc):  none Follow up as an outpatient:  We will arrange for outpatient cardiology follow up with Dr. Harrington Challenger or a member of her team.  For questions or updates, please contact [place] Please consult [email or web address] for contact info under Cardiology/STEMI.     Signed, Buford Dresser, MD, PhD [facility]  [address], [place], [place] [address] [phone] 12:09 PM 10/13/20

## 2020-10-13 NOTE — TOC Progression Note (Signed)
Transition of Care [facility]) - Progression Note    Patient Details  Name: [name] MRN: [id number] Date of Birth: 04-Nov-1941  Transition of Care [facility]) CM/SW Contact  Joaquin Courts, RN Phone Number: 10/13/2020, 3:13 PM  Clinical Narrative:    Patient set up for HHPT/OT/RN/Aide/Social work with Alvis Lemmings.   Expected Discharge Plan: [place] Barriers to Discharge: No Barriers Identified  Expected Discharge Plan and Services Expected Discharge Plan: [place]   Discharge Planning Services: CM Consult Post Acute Care Choice: [place] arrangements for the past 2 months: Single Family Home Expected Discharge Date: 10/13/20                         HH Arranged: PT, OT, RN, Nurse's Aide, Social Work [employer] Agency: [place] Date [facility] Agency Contacted: 10/13/20 Time [place]: [id number] Representative spoke with at [place]: [place] (Combine) Interventions    Readmission Risk Interventions No flowsheet data found.

## 2020-10-14 LAB — CULTURE, BLOOD (ROUTINE X 2)
Culture: NO GROWTH
Culture: NO GROWTH
Special Requests: ADEQUATE

## 2020-10-15 ENCOUNTER — Other Ambulatory Visit: Payer: Self-pay

## 2020-10-15 NOTE — Patient Outreach (Signed)
[place] [facility]) Care Management  10/15/2020  [name] 04-Jul-1941 [id number]   Telephone call to patient for disease management follow up.   No answer.  Unable to leave a message.  Plan: If no return call, RN CM will attempt patient again within 4 business days.   [name], RN, MSN [place] Management Care Management Coordinator Direct Line [phone] Cell [phone] Toll Free: [phone]  Fax: [phone]

## 2020-10-16 ENCOUNTER — Other Ambulatory Visit: Payer: Self-pay

## 2020-10-16 DIAGNOSIS — N1831 Chronic kidney disease, stage 3a: Secondary | ICD-10-CM | POA: Diagnosis not present

## 2020-10-16 DIAGNOSIS — I088 Other rheumatic multiple valve diseases: Secondary | ICD-10-CM | POA: Diagnosis not present

## 2020-10-16 DIAGNOSIS — I5033 Acute on chronic diastolic (congestive) heart failure: Secondary | ICD-10-CM | POA: Diagnosis not present

## 2020-10-16 DIAGNOSIS — I25118 Atherosclerotic heart disease of native coronary artery with other forms of angina pectoris: Secondary | ICD-10-CM | POA: Diagnosis not present

## 2020-10-16 DIAGNOSIS — I0981 Rheumatic heart failure: Secondary | ICD-10-CM | POA: Diagnosis not present

## 2020-10-16 DIAGNOSIS — J439 Emphysema, unspecified: Secondary | ICD-10-CM | POA: Diagnosis not present

## 2020-10-16 DIAGNOSIS — I130 Hypertensive heart and chronic kidney disease with heart failure and stage 1 through stage 4 chronic kidney disease, or unspecified chronic kidney disease: Secondary | ICD-10-CM | POA: Diagnosis not present

## 2020-10-16 DIAGNOSIS — D631 Anemia in chronic kidney disease: Secondary | ICD-10-CM | POA: Diagnosis not present

## 2020-10-16 DIAGNOSIS — M1030 Gout due to renal impairment, unspecified site: Secondary | ICD-10-CM | POA: Diagnosis not present

## 2020-10-16 DIAGNOSIS — I10 Essential (primary) hypertension: Secondary | ICD-10-CM | POA: Diagnosis not present

## 2020-10-16 NOTE — Patient Outreach (Signed)
[place] [facility]) Care Management  10/16/2020  [name] February 27, 1941 [id number]   Telephone call to patient for disease management follow up.   No answer.  Unable to leave a message.   Plan: If no return call, RN CM will attempt patient again within 4 business days and send letter.  [name], RN, MSN [place] Management Care Management Coordinator Direct Line [phone] Cell [phone] Toll Free: [phone]  Fax: [phone]

## 2020-10-17 ENCOUNTER — Other Ambulatory Visit: Payer: Self-pay

## 2020-10-17 DIAGNOSIS — D631 Anemia in chronic kidney disease: Secondary | ICD-10-CM | POA: Diagnosis not present

## 2020-10-17 DIAGNOSIS — M1030 Gout due to renal impairment, unspecified site: Secondary | ICD-10-CM | POA: Diagnosis not present

## 2020-10-17 DIAGNOSIS — I25118 Atherosclerotic heart disease of native coronary artery with other forms of angina pectoris: Secondary | ICD-10-CM | POA: Diagnosis not present

## 2020-10-17 DIAGNOSIS — J439 Emphysema, unspecified: Secondary | ICD-10-CM | POA: Diagnosis not present

## 2020-10-17 DIAGNOSIS — I088 Other rheumatic multiple valve diseases: Secondary | ICD-10-CM | POA: Diagnosis not present

## 2020-10-17 DIAGNOSIS — N1831 Chronic kidney disease, stage 3a: Secondary | ICD-10-CM | POA: Diagnosis not present

## 2020-10-17 DIAGNOSIS — I5033 Acute on chronic diastolic (congestive) heart failure: Secondary | ICD-10-CM | POA: Diagnosis not present

## 2020-10-17 DIAGNOSIS — I0981 Rheumatic heart failure: Secondary | ICD-10-CM | POA: Diagnosis not present

## 2020-10-17 DIAGNOSIS — I130 Hypertensive heart and chronic kidney disease with heart failure and stage 1 through stage 4 chronic kidney disease, or unspecified chronic kidney disease: Secondary | ICD-10-CM | POA: Diagnosis not present

## 2020-10-17 NOTE — Patient Outreach (Signed)
[place] [facility]) Care Management  10/17/2020  [name] 09/04/1941 [id number]   Telephone call to patient for disease management follow up.   No answer.  Unable to leave a message.   Plan: RN CM will attempt again in the month of November.    [name], RN, MSN [place] Management Care Management Coordinator Direct Line [phone] Cell [phone] Toll Free: [phone]  Fax: [phone]

## 2020-10-19 ENCOUNTER — Ambulatory Visit: Payer: Self-pay

## 2020-10-22 DIAGNOSIS — N1831 Chronic kidney disease, stage 3a: Secondary | ICD-10-CM | POA: Diagnosis not present

## 2020-10-22 DIAGNOSIS — I088 Other rheumatic multiple valve diseases: Secondary | ICD-10-CM | POA: Diagnosis not present

## 2020-10-22 DIAGNOSIS — M1030 Gout due to renal impairment, unspecified site: Secondary | ICD-10-CM | POA: Diagnosis not present

## 2020-10-22 DIAGNOSIS — J439 Emphysema, unspecified: Secondary | ICD-10-CM | POA: Diagnosis not present

## 2020-10-22 DIAGNOSIS — D631 Anemia in chronic kidney disease: Secondary | ICD-10-CM | POA: Diagnosis not present

## 2020-10-22 DIAGNOSIS — I5033 Acute on chronic diastolic (congestive) heart failure: Secondary | ICD-10-CM | POA: Diagnosis not present

## 2020-10-22 DIAGNOSIS — I25118 Atherosclerotic heart disease of native coronary artery with other forms of angina pectoris: Secondary | ICD-10-CM | POA: Diagnosis not present

## 2020-10-22 DIAGNOSIS — I0981 Rheumatic heart failure: Secondary | ICD-10-CM | POA: Diagnosis not present

## 2020-10-22 DIAGNOSIS — I130 Hypertensive heart and chronic kidney disease with heart failure and stage 1 through stage 4 chronic kidney disease, or unspecified chronic kidney disease: Secondary | ICD-10-CM | POA: Diagnosis not present

## 2020-10-22 DIAGNOSIS — N1832 Chronic kidney disease, stage 3b: Secondary | ICD-10-CM | POA: Diagnosis not present

## 2020-10-23 DIAGNOSIS — D631 Anemia in chronic kidney disease: Secondary | ICD-10-CM | POA: Diagnosis not present

## 2020-10-23 DIAGNOSIS — I25118 Atherosclerotic heart disease of native coronary artery with other forms of angina pectoris: Secondary | ICD-10-CM | POA: Diagnosis not present

## 2020-10-23 DIAGNOSIS — N1831 Chronic kidney disease, stage 3a: Secondary | ICD-10-CM | POA: Diagnosis not present

## 2020-10-23 DIAGNOSIS — I0981 Rheumatic heart failure: Secondary | ICD-10-CM | POA: Diagnosis not present

## 2020-10-23 DIAGNOSIS — I088 Other rheumatic multiple valve diseases: Secondary | ICD-10-CM | POA: Diagnosis not present

## 2020-10-23 DIAGNOSIS — M1030 Gout due to renal impairment, unspecified site: Secondary | ICD-10-CM | POA: Diagnosis not present

## 2020-10-23 DIAGNOSIS — J439 Emphysema, unspecified: Secondary | ICD-10-CM | POA: Diagnosis not present

## 2020-10-23 DIAGNOSIS — I5033 Acute on chronic diastolic (congestive) heart failure: Secondary | ICD-10-CM | POA: Diagnosis not present

## 2020-10-23 DIAGNOSIS — I130 Hypertensive heart and chronic kidney disease with heart failure and stage 1 through stage 4 chronic kidney disease, or unspecified chronic kidney disease: Secondary | ICD-10-CM | POA: Diagnosis not present

## 2020-10-25 ENCOUNTER — Other Ambulatory Visit: Payer: Self-pay

## 2020-10-25 DIAGNOSIS — I0981 Rheumatic heart failure: Secondary | ICD-10-CM | POA: Diagnosis not present

## 2020-10-25 DIAGNOSIS — I130 Hypertensive heart and chronic kidney disease with heart failure and stage 1 through stage 4 chronic kidney disease, or unspecified chronic kidney disease: Secondary | ICD-10-CM | POA: Diagnosis not present

## 2020-10-25 DIAGNOSIS — J449 Chronic obstructive pulmonary disease, unspecified: Secondary | ICD-10-CM | POA: Diagnosis not present

## 2020-10-25 DIAGNOSIS — I088 Other rheumatic multiple valve diseases: Secondary | ICD-10-CM | POA: Diagnosis not present

## 2020-10-25 DIAGNOSIS — I25118 Atherosclerotic heart disease of native coronary artery with other forms of angina pectoris: Secondary | ICD-10-CM | POA: Diagnosis not present

## 2020-10-25 DIAGNOSIS — M1030 Gout due to renal impairment, unspecified site: Secondary | ICD-10-CM | POA: Diagnosis not present

## 2020-10-25 DIAGNOSIS — I5033 Acute on chronic diastolic (congestive) heart failure: Secondary | ICD-10-CM | POA: Diagnosis not present

## 2020-10-25 DIAGNOSIS — J439 Emphysema, unspecified: Secondary | ICD-10-CM | POA: Diagnosis not present

## 2020-10-25 DIAGNOSIS — N1831 Chronic kidney disease, stage 3a: Secondary | ICD-10-CM | POA: Diagnosis not present

## 2020-10-25 DIAGNOSIS — D631 Anemia in chronic kidney disease: Secondary | ICD-10-CM | POA: Diagnosis not present

## 2020-10-25 NOTE — Patient Outreach (Signed)
[place] [facility]) Care Management  10/25/2020  [name] [birth year] [id number]   Telephone call to patient for follow up.  No answer. Unable to leave a message.    Plan: RN CM will attempt patient again in the month of November.    [name], RN, MSN [place] Management Care Management Coordinator Direct Line [phone] Cell [phone] Toll Free: [phone]  Fax: [phone]

## 2020-10-28 ENCOUNTER — Inpatient Hospital Stay (HOSPITAL_COMMUNITY)
Admission: EM | Admit: 2020-10-28 | Discharge: 2020-11-02 | DRG: 682 | Disposition: A | Discharge status: Home Health | Payer: Medicare HMO | Attending: Internal Medicine | Admitting: Internal Medicine

## 2020-10-28 ENCOUNTER — Emergency Department (HOSPITAL_COMMUNITY): Payer: Medicare HMO

## 2020-10-28 ENCOUNTER — Encounter (HOSPITAL_COMMUNITY): Payer: Self-pay | Admitting: *Deleted

## 2020-10-28 ENCOUNTER — Other Ambulatory Visit: Payer: Self-pay

## 2020-10-28 DIAGNOSIS — Z888 Allergy status to other drugs, medicaments and biological substances status: Secondary | ICD-10-CM

## 2020-10-28 DIAGNOSIS — D649 Anemia, unspecified: Secondary | ICD-10-CM | POA: Diagnosis present

## 2020-10-28 DIAGNOSIS — Z95828 Presence of other vascular implants and grafts: Secondary | ICD-10-CM

## 2020-10-28 DIAGNOSIS — Y929 Unspecified place or not applicable: Secondary | ICD-10-CM

## 2020-10-28 DIAGNOSIS — Z86711 Personal history of pulmonary embolism: Secondary | ICD-10-CM | POA: Diagnosis not present

## 2020-10-28 DIAGNOSIS — N179 Acute kidney failure, unspecified: Secondary | ICD-10-CM | POA: Diagnosis not present

## 2020-10-28 DIAGNOSIS — Z955 Presence of coronary angioplasty implant and graft: Secondary | ICD-10-CM

## 2020-10-28 DIAGNOSIS — I25810 Atherosclerosis of coronary artery bypass graft(s) without angina pectoris: Secondary | ICD-10-CM | POA: Diagnosis present

## 2020-10-28 DIAGNOSIS — I480 Paroxysmal atrial fibrillation: Secondary | ICD-10-CM | POA: Diagnosis present

## 2020-10-28 DIAGNOSIS — Z86718 Personal history of other venous thrombosis and embolism: Secondary | ICD-10-CM

## 2020-10-28 DIAGNOSIS — Z87891 Personal history of nicotine dependence: Secondary | ICD-10-CM | POA: Diagnosis not present

## 2020-10-28 DIAGNOSIS — R4020 Unspecified coma: Secondary | ICD-10-CM | POA: Diagnosis not present

## 2020-10-28 DIAGNOSIS — I10 Essential (primary) hypertension: Secondary | ICD-10-CM | POA: Diagnosis present

## 2020-10-28 DIAGNOSIS — J9611 Chronic respiratory failure with hypoxia: Secondary | ICD-10-CM | POA: Diagnosis not present

## 2020-10-28 DIAGNOSIS — Z20822 Contact with and (suspected) exposure to covid-19: Secondary | ICD-10-CM | POA: Diagnosis present

## 2020-10-28 DIAGNOSIS — Z96643 Presence of artificial hip joint, bilateral: Secondary | ICD-10-CM | POA: Diagnosis present

## 2020-10-28 DIAGNOSIS — K219 Gastro-esophageal reflux disease without esophagitis: Secondary | ICD-10-CM | POA: Diagnosis present

## 2020-10-28 DIAGNOSIS — I4891 Unspecified atrial fibrillation: Secondary | ICD-10-CM | POA: Diagnosis not present

## 2020-10-28 DIAGNOSIS — G4730 Sleep apnea, unspecified: Secondary | ICD-10-CM | POA: Diagnosis present

## 2020-10-28 DIAGNOSIS — K439 Ventral hernia without obstruction or gangrene: Secondary | ICD-10-CM | POA: Diagnosis not present

## 2020-10-28 DIAGNOSIS — Z825 Family history of asthma and other chronic lower respiratory diseases: Secondary | ICD-10-CM

## 2020-10-28 DIAGNOSIS — G4733 Obstructive sleep apnea (adult) (pediatric): Secondary | ICD-10-CM | POA: Diagnosis present

## 2020-10-28 DIAGNOSIS — I951 Orthostatic hypotension: Secondary | ICD-10-CM | POA: Diagnosis present

## 2020-10-28 DIAGNOSIS — E785 Hyperlipidemia, unspecified: Secondary | ICD-10-CM | POA: Diagnosis present

## 2020-10-28 DIAGNOSIS — K8689 Other specified diseases of pancreas: Secondary | ICD-10-CM | POA: Diagnosis not present

## 2020-10-28 DIAGNOSIS — Z961 Presence of intraocular lens: Secondary | ICD-10-CM | POA: Diagnosis present

## 2020-10-28 DIAGNOSIS — J449 Chronic obstructive pulmonary disease, unspecified: Secondary | ICD-10-CM | POA: Diagnosis present

## 2020-10-28 DIAGNOSIS — I878 Other specified disorders of veins: Secondary | ICD-10-CM | POA: Diagnosis present

## 2020-10-28 DIAGNOSIS — E039 Hypothyroidism, unspecified: Secondary | ICD-10-CM | POA: Diagnosis present

## 2020-10-28 DIAGNOSIS — R600 Localized edema: Secondary | ICD-10-CM | POA: Diagnosis not present

## 2020-10-28 DIAGNOSIS — F419 Anxiety disorder, unspecified: Secondary | ICD-10-CM | POA: Diagnosis present

## 2020-10-28 DIAGNOSIS — E860 Dehydration: Secondary | ICD-10-CM

## 2020-10-28 DIAGNOSIS — R601 Generalized edema: Secondary | ICD-10-CM | POA: Diagnosis not present

## 2020-10-28 DIAGNOSIS — I5033 Acute on chronic diastolic (congestive) heart failure: Secondary | ICD-10-CM | POA: Diagnosis not present

## 2020-10-28 DIAGNOSIS — T82868A Thrombosis of vascular prosthetic devices, implants and grafts, initial encounter: Secondary | ICD-10-CM | POA: Diagnosis present

## 2020-10-28 DIAGNOSIS — N281 Cyst of kidney, acquired: Secondary | ICD-10-CM | POA: Diagnosis not present

## 2020-10-28 DIAGNOSIS — Z9842 Cataract extraction status, left eye: Secondary | ICD-10-CM

## 2020-10-28 DIAGNOSIS — I110 Hypertensive heart disease with heart failure: Secondary | ICD-10-CM | POA: Diagnosis not present

## 2020-10-28 DIAGNOSIS — Z79899 Other long term (current) drug therapy: Secondary | ICD-10-CM

## 2020-10-28 DIAGNOSIS — E669 Obesity, unspecified: Secondary | ICD-10-CM | POA: Diagnosis present

## 2020-10-28 DIAGNOSIS — Z6833 Body mass index (BMI) 33.0-33.9, adult: Secondary | ICD-10-CM

## 2020-10-28 DIAGNOSIS — Z8701 Personal history of pneumonia (recurrent): Secondary | ICD-10-CM | POA: Diagnosis not present

## 2020-10-28 DIAGNOSIS — Y831 Surgical operation with implant of artificial internal device as the cause of abnormal reaction of the patient, or of later complication, without mention of misadventure at the time of the procedure: Secondary | ICD-10-CM | POA: Diagnosis present

## 2020-10-28 DIAGNOSIS — J189 Pneumonia, unspecified organism: Secondary | ICD-10-CM | POA: Diagnosis not present

## 2020-10-28 DIAGNOSIS — E876 Hypokalemia: Secondary | ICD-10-CM | POA: Diagnosis present

## 2020-10-28 DIAGNOSIS — Z9981 Dependence on supplemental oxygen: Secondary | ICD-10-CM

## 2020-10-28 DIAGNOSIS — F32A Depression, unspecified: Secondary | ICD-10-CM | POA: Diagnosis present

## 2020-10-28 DIAGNOSIS — K469 Unspecified abdominal hernia without obstruction or gangrene: Secondary | ICD-10-CM | POA: Diagnosis not present

## 2020-10-28 DIAGNOSIS — I5032 Chronic diastolic (congestive) heart failure: Secondary | ICD-10-CM | POA: Diagnosis not present

## 2020-10-28 DIAGNOSIS — Z880 Allergy status to penicillin: Secondary | ICD-10-CM | POA: Diagnosis not present

## 2020-10-28 DIAGNOSIS — Z96653 Presence of artificial knee joint, bilateral: Secondary | ICD-10-CM | POA: Diagnosis present

## 2020-10-28 DIAGNOSIS — R0602 Shortness of breath: Secondary | ICD-10-CM | POA: Diagnosis not present

## 2020-10-28 DIAGNOSIS — Z9841 Cataract extraction status, right eye: Secondary | ICD-10-CM

## 2020-10-28 DIAGNOSIS — I739 Peripheral vascular disease, unspecified: Secondary | ICD-10-CM | POA: Diagnosis present

## 2020-10-28 DIAGNOSIS — R55 Syncope and collapse: Secondary | ICD-10-CM | POA: Diagnosis not present

## 2020-10-28 DIAGNOSIS — Z79890 Hormone replacement therapy (postmenopausal): Secondary | ICD-10-CM

## 2020-10-28 DIAGNOSIS — I82220 Acute embolism and thrombosis of inferior vena cava: Secondary | ICD-10-CM | POA: Diagnosis not present

## 2020-10-28 DIAGNOSIS — T502X5A Adverse effect of carbonic-anhydrase inhibitors, benzothiadiazides and other diuretics, initial encounter: Secondary | ICD-10-CM | POA: Diagnosis present

## 2020-10-28 DIAGNOSIS — Z8249 Family history of ischemic heart disease and other diseases of the circulatory system: Secondary | ICD-10-CM

## 2020-10-28 DIAGNOSIS — Z7982 Long term (current) use of aspirin: Secondary | ICD-10-CM

## 2020-10-28 LAB — BASIC METABOLIC PANEL
Anion gap: 11 (ref 5–15)
Anion gap: 9 (ref 5–15)
BUN: 24 mg/dL — ABNORMAL HIGH (ref 8–23)
BUN: 22 mg/dL (ref 8–23)
CO2: 34 mmol/L — ABNORMAL HIGH (ref 22–32)
CO2: 33 mmol/L — ABNORMAL HIGH (ref 22–32)
Calcium: 8.7 mg/dL — ABNORMAL LOW (ref 8.9–10.3)
Calcium: 8.6 mg/dL — ABNORMAL LOW (ref 8.9–10.3)
Chloride: 93 mmol/L — ABNORMAL LOW (ref 98–111)
Chloride: 97 mmol/L — ABNORMAL LOW (ref 98–111)
Creatinine, Ser: 2.2 mg/dL — ABNORMAL HIGH (ref 0.44–1.00)
Creatinine, Ser: 1.6 mg/dL — ABNORMAL HIGH (ref 0.44–1.00)
GFR, Estimated: 22 mL/min — ABNORMAL LOW (ref >60)
GFR, Estimated: 32 mL/min — ABNORMAL LOW (ref >60)
Glucose, Bld: 76 mg/dL (ref 70–99)
Glucose, Bld: 97 mg/dL (ref 70–99)
Potassium: 3.2 mmol/L — ABNORMAL LOW (ref 3.5–5.1)
Potassium: 3.7 mmol/L (ref 3.5–5.1)
Sodium: 138 mmol/L (ref 135–145)
Sodium: 139 mmol/L (ref 135–145)

## 2020-10-28 LAB — CBC
HCT: 31.9 % — ABNORMAL LOW (ref 36.0–46.0)
HCT: 32.6 % — ABNORMAL LOW (ref 36.0–46.0)
Hemoglobin: 9.8 g/dL — ABNORMAL LOW (ref 12.0–15.0)
Hemoglobin: 9.9 g/dL — ABNORMAL LOW (ref 12.0–15.0)
MCH: 29.2 pg (ref 26.0–34.0)
MCH: 28.9 pg (ref 26.0–34.0)
MCHC: 30.7 g/dL (ref 30.0–36.0)
MCHC: 30.4 g/dL (ref 30.0–36.0)
MCV: 94.9 fL (ref 80.0–100.0)
MCV: 95.3 fL (ref 80.0–100.0)
Platelets: 225 10*3/uL (ref 150–400)
Platelets: 227 10*3/uL (ref 150–400)
RBC: 3.36 MIL/uL — ABNORMAL LOW (ref 3.87–5.11)
RBC: 3.42 MIL/uL — ABNORMAL LOW (ref 3.87–5.11)
RDW: 15.9 % — ABNORMAL HIGH (ref 11.5–15.5)
RDW: 15.9 % — ABNORMAL HIGH (ref 11.5–15.5)
WBC: 9.3 10*3/uL (ref 4.0–10.5)
WBC: 8.3 10*3/uL (ref 4.0–10.5)
nRBC: 0 % (ref 0.0–0.2)
nRBC: 0 % (ref 0.0–0.2)

## 2020-10-28 LAB — TYPE AND SCREEN
ABO/RH(D): O POS
Antibody Screen: NEGATIVE

## 2020-10-28 LAB — RESPIRATORY PANEL BY RT PCR (FLU A&B, COVID)
Influenza A by PCR: NEGATIVE
Influenza B by PCR: NEGATIVE
SARS Coronavirus 2 by RT PCR: NEGATIVE

## 2020-10-28 LAB — TROPONIN I (HIGH SENSITIVITY)
Troponin I (High Sensitivity): 17 ng/L (ref <18)
Troponin I (High Sensitivity): 12 ng/L (ref <18)

## 2020-10-28 LAB — BRAIN NATRIURETIC PEPTIDE: B Natriuretic Peptide: 339 pg/mL — ABNORMAL HIGH (ref 0.0–100.0)

## 2020-10-28 LAB — LACTIC ACID, PLASMA: Lactic Acid, Venous: 1.2 mmol/L (ref 0.5–1.9)

## 2020-10-28 MED ORDER — ASPIRIN 81 MG PO CHEW
81.0000 mg | CHEWABLE_TABLET | Freq: Every day | ORAL | Status: DC
  Administered 2020-10-28 – 2020-11-02 (×6): 81 mg via ORAL
  Filled 2020-10-28 (×6): qty 1

## 2020-10-28 MED ORDER — LEVOTHYROXINE SODIUM 50 MCG PO TABS
50.0000 ug | ORAL_TABLET | Freq: Every day | ORAL | Status: DC
  Administered 2020-10-28 – 2020-11-02 (×6): 50 ug via ORAL
  Filled 2020-10-28 (×6): qty 1

## 2020-10-28 MED ORDER — ACETAMINOPHEN 650 MG RE SUPP: 650.0000 mg | Freq: Four times a day (QID) | RECTAL | Status: DC | PRN

## 2020-10-28 MED ORDER — ROFLUMILAST 500 MCG PO TABS
500.0000 ug | ORAL_TABLET | Freq: Every evening | ORAL | Status: DC
  Administered 2020-10-28 – 2020-11-01 (×5): 500 ug via ORAL
  Filled 2020-10-28 (×6): qty 1

## 2020-10-28 MED ORDER — UMECLIDINIUM BROMIDE 62.5 MCG/INH IN AEPB: 1.0000 | INHALATION_SPRAY | Freq: Two times a day (BID) | RESPIRATORY_TRACT | Status: DC

## 2020-10-28 MED ORDER — ATORVASTATIN CALCIUM 10 MG PO TABS
20.0000 mg | ORAL_TABLET | Freq: Every day | ORAL | Status: DC
  Administered 2020-10-28 – 2020-11-02 (×6): 20 mg via ORAL
  Filled 2020-10-28 (×6): qty 2

## 2020-10-28 MED ORDER — MAGNESIUM OXIDE 400 (241.3 MG) MG PO TABS
400.0000 mg | ORAL_TABLET | Freq: Two times a day (BID) | ORAL | Status: DC
  Administered 2020-10-28 – 2020-11-02 (×11): 400 mg via ORAL
  Filled 2020-10-28 (×11): qty 1

## 2020-10-28 MED ORDER — PANTOPRAZOLE SODIUM 40 MG PO TBEC
40.0000 mg | DELAYED_RELEASE_TABLET | Freq: Every day | ORAL | Status: DC
  Administered 2020-10-28 – 2020-11-02 (×6): 40 mg via ORAL
  Filled 2020-10-28 (×6): qty 1

## 2020-10-28 MED ORDER — HEPARIN SODIUM (PORCINE) 5000 UNIT/ML IJ SOLN
5000.0000 [IU] | Freq: Three times a day (TID) | INTRAMUSCULAR | Status: DC
  Administered 2020-10-28 – 2020-11-02 (×14): 5000 [IU] via SUBCUTANEOUS
  Filled 2020-10-28 (×14): qty 1

## 2020-10-28 MED ORDER — MELATONIN 5 MG PO TABS
10.0000 mg | ORAL_TABLET | Freq: Every day | ORAL | Status: DC
  Administered 2020-10-28 – 2020-11-01 (×5): 10 mg via ORAL
  Filled 2020-10-28 (×6): qty 2

## 2020-10-28 MED ORDER — FENTANYL CITRATE (PF) 100 MCG/2ML IJ SOLN
50.0000 ug | Freq: Once | INTRAMUSCULAR | Status: AC
  Administered 2020-10-28: 50 ug via INTRAVENOUS
  Filled 2020-10-28: qty 2

## 2020-10-28 MED ORDER — POTASSIUM CHLORIDE CRYS ER 20 MEQ PO TBCR
40.0000 meq | EXTENDED_RELEASE_TABLET | Freq: Once | ORAL | Status: AC
  Administered 2020-10-28: 40 meq via ORAL
  Filled 2020-10-28: qty 2

## 2020-10-28 MED ORDER — POLYETHYLENE GLYCOL 3350 17 G PO PACK
17.0000 g | PACK | Freq: Every day | ORAL | Status: DC
  Administered 2020-10-28 – 2020-11-02 (×4): 17 g via ORAL
  Filled 2020-10-28 (×6): qty 1

## 2020-10-28 MED ORDER — ARIPIPRAZOLE 2 MG PO TABS
1.0000 mg | ORAL_TABLET | Freq: Every day | ORAL | Status: DC
  Administered 2020-10-28 – 2020-11-02 (×6): 1 mg via ORAL
  Filled 2020-10-28 (×6): qty 1

## 2020-10-28 MED ORDER — UMECLIDINIUM BROMIDE 62.5 MCG/INH IN AEPB
1.0000 | INHALATION_SPRAY | Freq: Every day | RESPIRATORY_TRACT | Status: DC
  Administered 2020-10-30 – 2020-10-31 (×2): 1 via RESPIRATORY_TRACT
  Filled 2020-10-28 (×2): qty 7

## 2020-10-28 MED ORDER — SODIUM CHLORIDE 0.9 % IV SOLN: INTRAVENOUS | Status: AC

## 2020-10-28 MED ORDER — OXYCODONE HCL ER 10 MG PO T12A
10.0000 mg | EXTENDED_RELEASE_TABLET | Freq: Three times a day (TID) | ORAL | Status: DC
  Administered 2020-10-28 – 2020-10-29 (×4): 10 mg via ORAL
  Filled 2020-10-28 (×4): qty 1

## 2020-10-28 MED ORDER — ACETAMINOPHEN 325 MG PO TABS
650.0000 mg | ORAL_TABLET | Freq: Four times a day (QID) | ORAL | Status: DC | PRN
  Administered 2020-10-28 – 2020-10-31 (×3): 650 mg via ORAL
  Filled 2020-10-28 (×4): qty 2

## 2020-10-28 MED ORDER — PRAMIPEXOLE DIHYDROCHLORIDE 0.25 MG PO TABS
0.2500 mg | ORAL_TABLET | Freq: Every evening | ORAL | Status: DC
  Administered 2020-10-28 – 2020-11-01 (×5): 0.25 mg via ORAL
  Filled 2020-10-28 (×6): qty 1

## 2020-10-28 MED ORDER — SODIUM CHLORIDE 0.9 % IV BOLUS
500.0000 mL | Freq: Once | INTRAVENOUS | Status: AC
  Administered 2020-10-28: 500 mL via INTRAVENOUS

## 2020-10-28 MED ORDER — GABAPENTIN 100 MG PO CAPS
100.0000 mg | ORAL_CAPSULE | Freq: Every day | ORAL | Status: DC
  Administered 2020-10-28 – 2020-11-02 (×6): 100 mg via ORAL
  Filled 2020-10-28 (×6): qty 1

## 2020-10-28 MED ORDER — POTASSIUM CHLORIDE CRYS ER 20 MEQ PO TBCR
20.0000 meq | EXTENDED_RELEASE_TABLET | Freq: Every day | ORAL | Status: DC
  Administered 2020-10-29 – 2020-10-30 (×2): 20 meq via ORAL
  Filled 2020-10-28 (×2): qty 1

## 2020-10-28 MED ORDER — OFLOXACIN 0.3 % OP SOLN
5.0000 [drp] | Freq: Every day | OPHTHALMIC | Status: DC
  Administered 2020-10-28 – 2020-11-02 (×5): 5 [drp] via OTIC
  Filled 2020-10-28 (×2): qty 5

## 2020-10-28 MED ORDER — FERROUS SULFATE 325 (65 FE) MG PO TABS
325.0000 mg | ORAL_TABLET | Freq: Every day | ORAL | Status: DC
  Administered 2020-10-28 – 2020-11-02 (×6): 325 mg via ORAL
  Filled 2020-10-28 (×6): qty 1

## 2020-10-28 MED ORDER — IPRATROPIUM-ALBUTEROL 20-100 MCG/ACT IN AERS
1.0000 | INHALATION_SPRAY | Freq: Four times a day (QID) | RESPIRATORY_TRACT | Status: DC | PRN
  Filled 2020-10-28: qty 4

## 2020-10-28 MED ORDER — ALLOPURINOL 100 MG PO TABS
100.0000 mg | ORAL_TABLET | Freq: Every day | ORAL | Status: DC
  Administered 2020-10-28 – 2020-11-02 (×6): 100 mg via ORAL
  Filled 2020-10-28 (×6): qty 1

## 2020-10-28 MED ORDER — TRAZODONE HCL 50 MG PO TABS
150.0000 mg | ORAL_TABLET | Freq: Every day | ORAL | Status: DC
  Administered 2020-10-28 – 2020-11-01 (×5): 150 mg via ORAL
  Filled 2020-10-28 (×5): qty 1

## 2020-10-28 MED ORDER — PILOCARPINE HCL 1 % OP SOLN
1.0000 [drp] | Freq: Two times a day (BID) | OPHTHALMIC | Status: DC
  Administered 2020-10-28 – 2020-11-02 (×10): 1 [drp] via OPHTHALMIC
  Filled 2020-10-28: qty 15

## 2020-10-28 MED ORDER — CITALOPRAM HYDROBROMIDE 40 MG PO TABS
40.0000 mg | ORAL_TABLET | Freq: Every day | ORAL | Status: DC
  Administered 2020-10-28: 40 mg via ORAL
  Filled 2020-10-28: qty 1

## 2020-10-28 NOTE — Plan of Care (Signed)
New Admit from ED.Patient lives at home with Son. Daughter and POA by bedside.

## 2020-10-28 NOTE — ED Triage Notes (Signed)
Pt from home by [facility] for hypotension. Pt admitted two weeks ago for pneumonia and sepsis, requiring pressors for hypotension. Initial bp 74/35, redness and warmth noted to bilateral lower extremities, A&Ox4. BP improved with fluids enroute (564ml NS given). Hx of CHF and COPD, on home oxygen at 4L

## 2020-10-28 NOTE — Progress Notes (Signed)
PROGRESS NOTE                                                                             PROGRESS NOTE                                                                                                                                                                                                             Patient Demographics:    [name], is a 79 y.o. female, DOB - 05/03/1941, CBJ:[id number]  Outpatient Primary MD for the patient is [name], [name], MD    LOS - 0  Admit date - 10/28/2020    Chief Complaint  Patient presents with  . Hypotension       Brief Narrative    This is a no charge note as patient was seen and admitted earlier today by Dr. Olevia Bowens, chart, imaging and labs were reviewed.   HPI: [name] is a 79 y.o. 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  She last took them yesterday Saturday, 10/27/2020.  She mentions that she has been lightheaded and weak for the past few days.  Lightheadedness is positional.  She has had some palpitations and more dyspnea than usual on exertion.  She denies nausea, vomiting or diarrhea.  However, she complains of some mild dysuria and urinary tenesmus.  She denies chest pain, diaphoresis, PND, orthopnea.  She states that her lower  extremity edema is better.  ED Course:  Initial vital signs were 97.4 F, pulse 81, respiration 18, blood pressure 91/72 mmHg O2 sat 100% on 4 L/min via nasal cannula.  The patient received a 500 mL bolus and 50 mcg of fentanyl.  CBC showed a white count of 9.3, hemoglobin 9.9 g/dL and platelets 225.  Troponin #1 was normal.  BNP 339.0 pg/mL.  Sodium one hundred and thirty-eight, potassium 3.2, chloride ninety-three and CO2 34 mmol/L.  Glucose seventy-six, BUN twenty-four and creatinine 2.20 mg/dL.  No active disease seen on chest radiograph.  Are you team reevaluated by which team are you today.  I will get these 1 days    Subjective:    [name] today ports her dyspnea at baseline, she does report some lower extremity pain as well.    Assessment  & Plan :    Principal Problem:   AKI (acute kidney injury) ([place]) Active Problems:   Hypothyroidism   Essential hypertension   COPD (chronic obstructive pulmonary disease) (HCC)   Sleep apnea   Dyslipidemia   Chronic diastolic heart failure (HCC)   GERD without esophagitis   History of pulmonary embolism   Normocytic anemia    AKI /hypotension -Most likely due to overdiuresis, creatinine elevated at 2.2, hold Lasix, hold nephrotoxic medications and continue with IV fluids.  Chronic lower extremity edema -Edema has worsened recently, where her Lasix has increased by her PCP 80 mg daily and 40 mg at bedtime, is with no current diastolic CHF, she is with known IVC filter, as well lower extremity edema may be related to venous insufficiency versus IVC clotting, discussed with vascular, will obtain Dopplers for iliacs and IVC to evaluate for clotted IVC.    Hypokalemia -Repleted    Hypothyroidism Continue levothyroxine 50 mcg p.o. daily.    Essential hypertension Holding diuretics. Monitor blood pressure. Continue gentle IV hydration.    COPD (chronic obstructive pulmonary disease) (HCC) Continue daily Daliresp, Spiriva and  Combivent.    Sleep apnea No longer on CPAP.    Dyslipidemia Continue atorvastatin 20 mg p.o. daily    Chronic diastolic heart failure (HCC) Volume depleted. Hold diuretics.    GERD without esophagitis Pantoprazole 40 mg p.o. daily.    History of pulmonary embolism and DVT. SCDs due to history of GI bleeds. The patient has history of IVC placement.    Normocytic anemia Monitor H&H. Transfuse as needed.  SpO2: 91 % O2 Flow Rate (L/min): 3 L/min  Recent Labs  Lab 10/28/20 0108 10/28/20 0659 10/28/20 1138  WBC 9.3  --  8.3  PLT 225  --  227  BNP 339.0*  --   --   LATICACIDVEN  --  1.2  --   SARSCOV2NAA  --  NEGATIVE  --        ABG     Component Value Date/Time   PHART 7.374 03/14/2018 0127   PCO2ART 29.2 (L) 03/14/2018 0127   PO2ART 139 (H) 03/14/2018 0127   HCO3 16.7 (L) 03/14/2018 0127   TCO2 30 10/29/2011 0925   ACIDBASEDEF 7.6 (H) 03/14/2018 0127   O2SAT 98.8 03/14/2018 0127        Condition - Extremely Guarded  Family Communication  :  Son at bedside  Code Status :  Full  Consults  :  none  Procedures  :  none  Disposition Plan  :    Status is: Observation  The patient will require care spanning > 2 midnights and should be moved to inpatient because: Ongoing diagnostic testing needed  not appropriate for outpatient work up  Dispo: The patient is from: Home              Anticipated d/c is to: Home              Anticipated d/c date is: 1 day              Patient currently is not medically stable to d/c.      DVT Prophylaxis  :   Heparin   Lab Results  Component Value Date   PLT 227 10/28/2020    Diet :  Diet Order            Diet Heart Room service appropriate? Yes; Fluid consistency: Thin  Diet effective now                  Inpatient Medications  Scheduled Meds: . allopurinol  100 mg Oral Daily  . ARIPiprazole  1 mg Oral Daily  . aspirin  81 mg Oral Daily  . atorvastatin  20 mg Oral Daily  . citalopram   40 mg Oral QHS  . ferrous sulfate  325 mg Oral Daily  . gabapentin  100 mg Oral Daily  . heparin injection (subcutaneous)  5,000 Units Subcutaneous Q8H  . levothyroxine  50 mcg Oral Q0600  . magnesium oxide  400 mg Oral BID  . melatonin  10 mg Oral QHS  . ofloxacin  5 drop Left EAR Daily  . oxyCODONE  10 mg Oral Q8H  . pantoprazole  40 mg Oral Daily  . pilocarpine  1 drop Both Eyes BID  . polyethylene glycol  17 g Oral Daily  . potassium chloride SA  20 mEq Oral Daily  . pramipexole  0.25 mg Oral QPM  . roflumilast  500 mcg Oral QPM  . traZODone  150 mg Oral QHS  . umeclidinium bromide  1 puff Inhalation Daily   Continuous Infusions: . sodium chloride 75 mL/hr at 10/28/20 0926   PRN Meds:.acetaminophen **OR** acetaminophen, Ipratropium-Albuterol  Antibiotics  :    Anti-infectives (From admission, onward)   None      Phillips Climes M.D on 10/28/2020 at 12:56 PM  To page go to [email or web address]   [facility] -  Office  [phone]        Objective:   Vitals:   10/28/20 1103 10/28/20 1130 10/28/20 1200 10/28/20 1230  BP: (!) 109/43 (!) 128/50 (!) 112/53 (!) 110/49  Pulse: 76 90 72 88  Resp: 16 16 20 16   Temp:      TempSrc:      SpO2: 93% 95% 95% 91%    Wt Readings from Last 3 Encounters:  10/13/20 77.8 kg  09/10/20 75.3 kg  03/08/20 75.1 kg     Intake/Output Summary (Last 24 hours) at 10/28/2020 1256 Last data filed at 10/28/2020 0914 Gross per 24 hour  Intake 3000 ml  Output --  Net 3000 ml     Physical Exam  Awake Alert, No new F.N deficits, Normal affect, frail, chronically ill-appearing Symmetrical Chest wall movement, Good air movement bilaterally, no wheezing or crackles at the bases RRR,No Gallops,Rubs or new Murmurs, No Parasternal Heave +ve B.Sounds, Abd Soft, No tenderness, No organomegaly appriciated, No rebound - guarding or rigidity. No Cyanosis, Clubbing, with +3 lower extremity edema with erythema.    Data Review:     CBC Recent Labs  Lab 10/28/20 0108 10/28/20 1138  WBC 9.3 8.3  HGB 9.8* 9.9*  HCT 31.9* 32.6*  PLT 225 227  MCV 94.9 95.3  MCH 29.2 28.9  MCHC 30.7 30.4  RDW 15.9* 15.9*    Recent Labs  Lab 10/28/20 0108 10/28/20 0659 10/28/20 1138  NA 138  --  139  K 3.2*  --  3.7  CL 93*  --  97*  CO2 34*  --  33*  GLUCOSE 76  --  97  BUN 24*  --  22  CREATININE 2.20*  --  1.60*  CALCIUM 8.7*  --  8.6*  LATICACIDVEN  --  1.2  --   BNP 339.0*  --   --     ------------------------------------------------------------------------------------------------------------------ No results for input(s): CHOL, HDL, LDLCALC, TRIG, CHOLHDL, LDLDIRECT in the last 72 hours.  No results found for: HGBA1C ------------------------------------------------------------------------------------------------------------------ No results for input(s): TSH, T4TOTAL, T3FREE, THYROIDAB in the last 72 hours.  Invalid input(s): FREET3  Cardiac Enzymes No results for input(s): CKMB, TROPONINI, MYOGLOBIN in the last 168 hours.  Invalid input(s): CK ------------------------------------------------------------------------------------------------------------------    Component Value Date/Time   BNP 339.0 (H) 10/28/2020 0108    Micro Results Recent Results (from the past 240 hour(s))  Respiratory Panel by RT PCR (Flu A&B, Covid) - Nasopharyngeal Swab     Status: None   Collection Time: 10/28/20  6:59 AM   Specimen: Nasopharyngeal Swab  Result Value Ref Range Status   SARS Coronavirus 2 by RT PCR NEGATIVE NEGATIVE Final    Comment: (NOTE) SARS-CoV-2 target nucleic acids are NOT DETECTED.  The SARS-CoV-2 RNA is generally detectable in upper respiratoy specimens during the acute phase of infection. The lowest concentration of SARS-CoV-2 viral copies this assay can detect is 131 copies/mL. A negative result does not preclude SARS-Cov-2 infection and should not be used as the sole basis for treatment  or other patient management decisions. A negative result may occur with  improper specimen collection/handling, submission of specimen other than nasopharyngeal swab, presence of viral mutation(s) within the areas targeted by this assay, and inadequate number of viral copies (<131 copies/mL). A negative result must be combined with clinical observations, patient history, and epidemiological information. The expected result is Negative.  Fact Sheet for Patients:  PinkCheek.be  Fact Sheet for Healthcare Providers:  GravelBags.it  This test is no t yet approved or cleared by the [place] FDA and  has been authorized for detection and/or diagnosis of SARS-CoV-2 by FDA under an Emergency Use Authorization (EUA). This EUA will remain  in effect (meaning this test can be used) for the duration of the COVID-19 declaration under Section 564(b)(1) of the Act, 21 U.S.C. section 360bbb-3(b)(1), unless the authorization is terminated or revoked sooner.     Influenza A by PCR NEGATIVE NEGATIVE Final   Influenza B by PCR NEGATIVE NEGATIVE Final    Comment: (NOTE) The Xpert Xpress SARS-CoV-2/FLU/RSV assay is intended as an aid in  the diagnosis of influenza from Nasopharyngeal swab specimens and  should not be used as a sole basis for treatment. Nasal washings and  aspirates are unacceptable for Xpert Xpress SARS-CoV-2/FLU/RSV  testing.  Fact Sheet for Patients: PinkCheek.be  Fact Sheet for Healthcare Providers: GravelBags.it  This test is not yet approved or cleared by the [place] FDA and  has been authorized for detection and/or diagnosis of SARS-CoV-2 by  FDA under an Emergency Use Authorization (EUA). This EUA will remain  in effect (meaning this test can be used) for the duration of the  Covid-19 declaration under Section 564(b)(1) of the Act, 21  U.S.C.  section 360bbb-3(b)(1), unless the authorization is  terminated or revoked. Performed at [facility], [place] [address], [place], [place] [address]     Radiology Reports NM Pulmonary Perfusion  Result Date: 10/11/2020 CLINICAL DATA:  Shortness of breath and chest pain EXAM: NUCLEAR MEDICINE PERFUSION LUNG SCAN TECHNIQUE: Perfusion images were obtained in multiple projections after intravenous injection of radiopharmaceutical. Views: Anterior, posterior, RPO, LPO, RAO, LAO RADIOPHARMACEUTICALS:  4.2 mCi Tc-61m MAA IV COMPARISON:  Chest radiograph October 10, 2020 FINDINGS: Distribution of radiotracer uptake is homogeneous and symmetric bilaterally. No appreciable perfusion defects. IMPRESSION: No appreciable perfusion defects. No findings indicative of pulmonary embolus. Electronically Signed   By: Lowella Grip III M.D.   On: 10/11/2020 11:07   DG Chest Portable 1 View  Result Date: 10/28/2020 CLINICAL DATA:  Shortness of breath EXAM: PORTABLE CHEST 1 VIEW COMPARISON:  10/10/2020 FINDINGS: The heart size and mediastinal contours are within normal limits. Both lungs are clear. The visualized skeletal structures are unremarkable. The patient is status post prior median sternotomy. Aortic calcifications are noted. There are calcified bilateral breast implants. There is pleuroparenchymal scarring at the lung apices. There are end-stage degenerative changes of the left glenohumeral joint. The patient is status post prior total shoulder arthroplasty on the right. IMPRESSION: No active disease. Electronically Signed   By: Constance Holster M.D.   On: 10/28/2020 01:49   DG CHEST PORT 1 VIEW  Result Date: 10/10/2020 CLINICAL DATA:  Dyspnea. EXAM: PORTABLE CHEST 1 VIEW COMPARISON:  October 09, 2020. FINDINGS: Stable cardiomediastinal silhouette. Status post coronary bypass graft. No pneumothorax or pleural effusion is noted. No acute pulmonary disease is noted. Status post right shoulder  arthroplasty. IMPRESSION: No active disease. Aortic Atherosclerosis (ICD10-I70.0). Electronically Signed   By: Marijo Conception M.D.   On: 10/10/2020 10:13   DG Chest Port 1 View  Result Date: 10/09/2020 CLINICAL DATA:  Shortness of breath, cough and fever. EXAM: PORTABLE CHEST 1 VIEW COMPARISON:  10/05/2019 FINDINGS: Previous median sternotomy and CABG. Mild cardiomegaly. Aortic atherosclerosis. Bilateral breast implants with density overlying the chest. Suspicion of bronchitis and hazy pneumonia in the mid and lower lungs. No dense consolidation, lobar collapse or effusion. Old shoulder replacement on the right. Chronic degenerative changes of the left shoulder. IMPRESSION: 1. Suspicion of bronchitis and hazy pneumonia in the mid and lower lungs. No dense consolidation or lobar collapse. 2. Previous CABG. Mild cardiomegaly. Aortic atherosclerosis. Electronically Signed   By: Nelson Chimes M.D.   On: 10/09/2020 13:15   ECHOCARDIOGRAM COMPLETE  Result Date: 10/09/2020    ECHOCARDIOGRAM REPORT   Patient Name:   KAMEAH RAWL Date of Exam: 10/09/2020 Medical Rec #:  [id number]      Height:       68.0 in Accession #:    [id number]     Weight:       167.5 lb Date of Birth:  01-12-1941       BSA:          1.896 m Patient Age:    85 years       BP:           92/42 mmHg Patient Gender: F              HR:           88 bpm. Exam Location:  Inpatient Procedure: Cardiac Doppler, Color Doppler, 2D Echo and 3D Echo STAT ECHO Indications:    R07.9* Chest pain, unspecified  History:  Patient has prior history of Echocardiogram examinations, most                 recent 10/05/2019. CAD, Abnormal ECG and Prior CABG, COPD,                 Signs/Symptoms:Dyspnea; Risk Factors:Sleep Apnea and                 Dyslipidemia. Hypoxia. Pulmonary embolus.  Sonographer:    Roseanna Rainbow RDCS Referring Phys: [id number] [place]  1. Diastology suggestive of impaired LV filling. Left ventricular ejection fraction, by  estimation, is 60 to 65%. The left ventricle has normal function. The left ventricle has no regional wall motion abnormalities. There is moderate concentric left ventricular hypertrophy. Left ventricular diastolic parameters are indeterminate.  2. Right ventricular systolic function is low normal. The right ventricular size is normal. There is normal pulmonary artery systolic pressure.  3. Left atrial size was moderately dilated.  4. Right atrial size was moderately dilated.  5. The mitral valve is myxomatous. No evidence of mitral valve regurgitation. The mean mitral valve gradient is 5.0 mmHg with average heart rate of 87 bpm. Moderate mitral annular calcification.  6. The aortic valve is tricuspid. Aortic valve regurgitation is not visualized.  7. The inferior vena cava is dilated in size with <50% respiratory variability, suggesting right atrial pressure of 15 mmHg. Comparison(s): A prior study was performed on 10/05/2019. No significant change from prior study. Subtle decrease in RV function. FINDINGS  Left Ventricle: Diastology suggestive of impaired LV filling. Left ventricular ejection fraction, by estimation, is 60 to 65%. The left ventricle has normal function. The left ventricle has no regional wall motion abnormalities. The left ventricular internal cavity size was normal in size. There is moderate concentric left ventricular hypertrophy. Left ventricular diastolic parameters are indeterminate. Right Ventricle: The right ventricular size is normal. Right vetricular wall thickness was not well visualized. Right ventricular systolic function is low normal. There is normal pulmonary artery systolic pressure. The tricuspid regurgitant velocity is 2.30 m/s, and with an assumed right atrial pressure of 3 mmHg, the estimated right ventricular systolic pressure is 75.9 mmHg. Left Atrium: Left atrial size was moderately dilated. Right Atrium: Right atrial size was moderately dilated. Pericardium: There is no  evidence of pericardial effusion. Mitral Valve: The mitral valve is myxomatous. There is mild calcification of the mitral valve leaflet(s). Moderate mitral annular calcification. No evidence of mitral valve regurgitation. MV peak gradient, 25.4 mmHg. The mean mitral valve gradient is 5.0  mmHg with average heart rate of 87 bpm. Tricuspid Valve: The tricuspid valve is grossly normal. Tricuspid valve regurgitation is trivial. Aortic Valve: The aortic valve is tricuspid. Aortic valve regurgitation is not visualized. Pulmonic Valve: The pulmonic valve was not well visualized. Pulmonic valve regurgitation is trivial. Aorta: The aortic root and ascending aorta are structurally normal, with no evidence of dilitation. Venous: The inferior vena cava is dilated in size with less than 50% respiratory variability, suggesting right atrial pressure of 15 mmHg. IAS/Shunts: The interatrial septum appears to be lipomatous. The atrial septum is grossly normal.  LEFT VENTRICLE PLAX 2D LVIDd:         4.60 cm     Diastology LVIDs:         3.00 cm     LV e' medial:    5.87 cm/s LV PW:         1.40 cm     LV E/e' medial:  27.1 LV IVS:        1.40 cm     LV e' lateral:   8.21 cm/s LVOT diam:     1.70 cm     LV E/e' lateral: 19.4 LV SV:         62 LV SV Index:   33 LVOT Area:     2.27 cm  LV Volumes (MOD) LV vol d, MOD A2C: 99.9 ml LV vol d, MOD A4C: 71.4 ml LV vol s, MOD A2C: 35.9 ml LV vol s, MOD A4C: 26.8 ml LV SV MOD A2C:     64.0 ml LV SV MOD A4C:     71.4 ml LV SV MOD BP:      56.6 ml RIGHT VENTRICLE            IVC RV S prime:     8.08 cm/s  IVC diam: 3.00 cm TAPSE (M-mode): 1.4 cm LEFT ATRIUM             Index       RIGHT ATRIUM           Index LA diam:        3.80 cm 2.00 cm/m  RA Area:     17.30 cm LA Vol (A2C):   56.3 ml 29.70 ml/m RA Volume:   44.90 ml  23.69 ml/m LA Vol (A4C):   44.9 ml 23.69 ml/m LA Biplane Vol: 55.6 ml 29.33 ml/m  AORTIC VALVE LVOT Vmax:   129.00 cm/s LVOT Vmean:  86.900 cm/s LVOT VTI:    0.272 m  AORTA  Ao Root diam: 3.40 cm Ao Asc diam:  3.40 cm MITRAL VALVE                TRICUSPID VALVE MV Area (PHT): 4.15 cm     TR Peak grad:   21.2 mmHg MV Peak grad:  25.4 mmHg    TR Vmax:        230.00 cm/s MV Mean grad:  5.0 mmHg MV Vmax:       2.52 m/s     SHUNTS MV Vmean:      88.8 cm/s    Systemic VTI:  0.27 m MV Decel Time: 183 msec     Systemic Diam: 1.70 cm MV E velocity: 159.00 cm/s MV A velocity: 209.00 cm/s MV E/A ratio:  0.76 Rudean Haskell MD Electronically signed by Rudean Haskell MD Signature Date/Time: 10/09/2020/4:38:55 PM    Final    VAS [place] LOWER EXTREMITY VENOUS (DVT)  Result Date: 10/10/2020  Lower Venous DVTStudy Indications: Elevated ddimer.  Comparison Study: previous 11/03/18 Performing Technologist: Abram Sander RVS  Examination Guidelines: A complete evaluation includes B-mode imaging, spectral Doppler, color Doppler, and power Doppler as needed of all accessible portions of each vessel. Bilateral testing is considered an integral part of a complete examination. Limited examinations for reoccurring indications may be performed as noted. The reflux portion of the exam is performed with the patient in reverse Trendelenburg.  +---------+---------------+---------+-----------+----------+--------------+ RIGHT    CompressibilityPhasicitySpontaneityPropertiesThrombus Aging +---------+---------------+---------+-----------+----------+--------------+ CFV      Full           Yes      Yes                                 +---------+---------------+---------+-----------+----------+--------------+ SFJ      Full                                                        +---------+---------------+---------+-----------+----------+--------------+  FV Prox  Full                                                        +---------+---------------+---------+-----------+----------+--------------+ FV Mid   Full                                                         +---------+---------------+---------+-----------+----------+--------------+ FV DistalFull                                                        +---------+---------------+---------+-----------+----------+--------------+ PFV      Full                                                        +---------+---------------+---------+-----------+----------+--------------+ POP      None           Yes      Yes                  Chronic        +---------+---------------+---------+-----------+----------+--------------+ PTV      Full                                                        +---------+---------------+---------+-----------+----------+--------------+ PERO     Full                                                        +---------+---------------+---------+-----------+----------+--------------+   +---------+---------------+---------+-----------+----------+--------------+ LEFT     CompressibilityPhasicitySpontaneityPropertiesThrombus Aging +---------+---------------+---------+-----------+----------+--------------+ CFV      Full           Yes      Yes                                 +---------+---------------+---------+-----------+----------+--------------+ SFJ      Full                                                        +---------+---------------+---------+-----------+----------+--------------+ FV Prox  Full                                                        +---------+---------------+---------+-----------+----------+--------------+  FV Mid   Full                                                        +---------+---------------+---------+-----------+----------+--------------+ FV DistalFull                                                        +---------+---------------+---------+-----------+----------+--------------+ PFV      Full                                                         +---------+---------------+---------+-----------+----------+--------------+ POP      Full           Yes      Yes                                 +---------+---------------+---------+-----------+----------+--------------+ PTV      Full                                                        +---------+---------------+---------+-----------+----------+--------------+ PERO     Full                                                        +---------+---------------+---------+-----------+----------+--------------+     Summary: RIGHT: - Findings consistent with chronic deep vein thrombosis involving the right popliteal vein. - No cystic structure found in the popliteal fossa.  LEFT: - There is no evidence of deep vein thrombosis in the lower extremity.  - No cystic structure found in the popliteal fossa.  *See table(s) above for measurements and observations. Electronically signed by Harold Barban MD on 10/10/2020 at 7:40:41 PM.    Final    VAS [place] UPPER EXTREMITY VENOUS DUPLEX  Result Date: 10/10/2020 UPPER VENOUS STUDY  Indications: elevated ddimer Comparison Study: no prior Performing Technologist: Abram Sander RVS  Examination Guidelines: A complete evaluation includes B-mode imaging, spectral Doppler, color Doppler, and power Doppler as needed of all accessible portions of each vessel. Bilateral testing is considered an integral part of a complete examination. Limited examinations for reoccurring indications may be performed as noted.  Right Findings: +----------+------------+---------+-----------+----------+-------+ RIGHT     CompressiblePhasicitySpontaneousPropertiesSummary +----------+------------+---------+-----------+----------+-------+ IJV           Full       Yes       Yes                      +----------+------------+---------+-----------+----------+-------+ Subclavian    Full       Yes       Yes                       +----------+------------+---------+-----------+----------+-------+  Axillary      Full       Yes       Yes                      +----------+------------+---------+-----------+----------+-------+ Brachial      Full       Yes       Yes                      +----------+------------+---------+-----------+----------+-------+ Radial        Full                                          +----------+------------+---------+-----------+----------+-------+ Ulnar         Full                                          +----------+------------+---------+-----------+----------+-------+ Cephalic      Full                                          +----------+------------+---------+-----------+----------+-------+ Basilic       Full                                          +----------+------------+---------+-----------+----------+-------+  Left Findings: +----------+------------+---------+-----------+----------+-------+ LEFT      CompressiblePhasicitySpontaneousPropertiesSummary +----------+------------+---------+-----------+----------+-------+ IJV           Full       Yes       Yes                      +----------+------------+---------+-----------+----------+-------+ Subclavian    Full       Yes       Yes                      +----------+------------+---------+-----------+----------+-------+ Axillary      Full       Yes       Yes                      +----------+------------+---------+-----------+----------+-------+ Brachial      Full       Yes       Yes                      +----------+------------+---------+-----------+----------+-------+ Radial        Full                                          +----------+------------+---------+-----------+----------+-------+ Ulnar         Full                                          +----------+------------+---------+-----------+----------+-------+ Cephalic      Full                                           +----------+------------+---------+-----------+----------+-------+  Basilic       Full                                          +----------+------------+---------+-----------+----------+-------+  Summary: No evidence of deep vein or superficial vein thrombosis involving the right and left upper extremities.  *See table(s) above for measurements and observations.  Diagnosing physician: Harold Barban MD Electronically signed by Harold Barban MD on 10/10/2020 at 7:40:28 PM.    Final

## 2020-10-28 NOTE — ED Provider Notes (Signed)
[facility] EMERGENCY DEPARTMENT Provider Note   CSN: [id number] Arrival date & time: 10/28/20  0057     History Chief Complaint  Patient presents with  . Hypotension    [name] is a 79 y.o. female.  Presents ER with concern for low blood pressure.  Patient recently admitted for pneumonia, sepsis, low blood pressure.  Has history of heart failure with preserved ejection fraction.  On aggressive Lasix regimen, currently 80 in the morning, 40 in the evening.  Patient has been feeling her normal self this week, no new complaints.  She checks her blood pressure daily and today noted it to be very low prompting call to EMS.  She deals with chronic back pain, this is unchanged from her baseline.  She denies any other new pains today.  Reports that the swelling in her legs is the best that it has been in a very long time.  Additional history obtained via chart review and discussion with son at bedside.  HPI     Past Medical History:  Diagnosis Date  . Anemia    bld. transfusion post lumbar surgery- 2012  . Anxiety   . Arthralgia    NOS  . Blood transfusion 2012; 02/2018   WITH BACK SURGERY; "related to hematoma in stomach" (10/06/2018)  . CAD (coronary artery disease)    s/p CABG 2004; s/p DES to LM in 2010;  [place] 10/29/11: EF 50-55%, mild elevated filling pressures, no pulmonary HTN, LM 90% ISR, LAD and CFX occluded, S-RI occluded (old), S-OM3 ok and L-LAD ok, native nondominant RCA 95% -  med rx recommended ; Lexiscan Myoview 7/13 at [facility]: demonstrated "normal LV function, anterior attenuation and localized ischemia, inferior, basilar, mid section"  . Carotid artery disease ([place])    Carotid US 7/84:  RICA 6-96; LICA 29-52; R subclavian stenosis - Repeat 1 year. // Carotid US 05/2019: R 1-39; L 40-59; R vertebral with atypical antegrade flow; R subclavian stenosis >> repeat 1 year // Carotid US 7/21: Bilat 40-59; L subclavian stenosis   . CHF  (congestive heart failure) ([place]) 01/21/2020  . Chronic diastolic heart failure (HCC)    Echo 9/10: EF 84-13%, grade 1 diastolic dysfunction  . Chronic lower back pain   . COPD (chronic obstructive pulmonary disease) ([place])    Emphysema dxed by Dr. Woody Seller in [place] based on PFTs per pt in 2006; placed on albuterol  . Depression    TAKES CELEXA  AND  (OFF- WELBUTRIN)  . Diuretic-induced hypokalemia 10/08/2018  . DVT of lower extremity (deep venous thrombosis) (HCC)    recurrent. bilateral (2 episodes)  . Dyspnea    home o2 when needed  . Dysrhythmia    afib with cabg  . Exertional angina (HCC)    Treated with Isosorbide, Ranexa, amlodipine; intolerant to metoprolol  . GERD (gastroesophageal reflux disease)   . Gout    "on daily RX" (10/06/2018)  . HLD (hyperlipidemia)   . Hypertension   . Hyperthyroidism   . L Subclavian Artery Stenosis    Carotid US 7/21   . Obesity (BMI 30-39.9) 2009   BMI 33  . Osteoarthritis    "all over; hands" (10/06/2018)  . Oxygen deficiency   . Oxygen dependent    4L at home as needed (10/06/2018)  . Pneumonia   . PVD (peripheral vascular disease) ([place])   . Sleep apnea 2012   USED CPAP THEN  STARTED USING SPIRIVA , Nov. 2013- last evaluation , changed  from mask to aparatus that is just for her nose.. ; reports 05-28-18 "the mask smothers me so i dont use it right now" (10/06/2018)    Patient Active Problem List   Diagnosis Date Noted  . Acute respiratory failure with hypoxia ([place]) 10/09/2020  . Hypotension 10/09/2020  . CAP (community acquired pneumonia) 10/09/2020  . L Subclavian Artery Stenosis   . S/P hip replacement, left 02/07/2020  . History of pulmonary embolism 10/04/2019  . Dyspnea 12/24/2018  . Cellulitis 12/24/2018  . Otitis media 12/24/2018  . Diuretic-induced hypokalemia 10/08/2018  . Acute on chronic diastolic heart failure ([place]) 10/06/2018  . Hematuria 04/01/2018  . Acute lower UTI 04/01/2018  . Hematoma of abdominal wall,  subsequent encounter 04/01/2018  . Chronic diastolic heart failure ([place]) 03/29/2018  . GERD without esophagitis 03/29/2018  . Chronic gout 03/29/2018  . Chronic bilateral low back pain without sciatica 03/29/2018  . Colitis 03/10/2018  . AKI (acute kidney injury) ([place])   . Ascites   . Pulmonary embolism ([place]) 03/01/2018  . 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 Acute on chronic respiratory failure with hypoxia ([place]) 03/01/2018  . Angiodysplasia of intestine   . Hematochezia   . Malnutrition of moderate degree 02/15/2017  . Blood loss anemia 02/13/2017  . Impingement syndrome of left shoulder 02/10/2013  . PAD (peripheral artery disease) ([name]) 11/17/2011  . Dyslipidemia 10/17/2011  . Coronary artery disease involving native coronary artery of native heart with angina pectoris ([place])   . Hypothyroidism 05/03/2008  . Essential hypertension 05/03/2008  . Sleep apnea 05/03/2008  . COPD (chronic obstructive pulmonary disease) ([place]) 04/12/2008    Past Surgical History:  Procedure Laterality Date  . ANKLE SURGERY  2004  . ANTERIOR CERVICAL DECOMP/DISCECTOMY FUSION  11/2007  . AUGMENTATION MAMMAPLASTY    . [place]   lower; another scheduled, opt. reports 4 back- lumbar, 3 cerv. fusions  for later 2009  . CARDIAC CATHETERIZATION  11/2009   Patent LIMA to LAD and patent SVG to OM1. Occluded SVG to ramus and diagonal. Left main: 90% ostial stenosis, LCX 60-70% proximal stenosis  . CATARACT EXTRACTION W/ INTRAOCULAR LENS  IMPLANT, BILATERAL Bilateral 2006  . COLONOSCOPY WITH PROPOFOL N/A 02/17/2017   Procedure: COLONOSCOPY WITH PROPOFOL;  Surgeon: Jerene Bears, MD;  Location: [facility] ENDOSCOPY;  Service: Endoscopy;  Laterality: N/A;  . CORONARY ANGIOPLASTY WITH STENT PLACEMENT  11/2009   Drug eluting stent to left main artery: 4.0 X 12 mm Ion   . CORONARY ARTERY BYPASS GRAFT  2004   "CABG X4"  . ESOPHAGOGASTRODUODENOSCOPY N/A 02/14/2017   Procedure: ESOPHAGOGASTRODUODENOSCOPY  (EGD);  Surgeon: Doran Stabler, MD;  Location[facility] END[facility]COPY;  Service: Endoscopy;  Laterality: N/A;  . GIVENS CAPSULE STUDY N/A 02/15/2017   Procedure: GIVENS CAPSULE STUDY;  Surgeon: Doran Stabler, MD;  Location: [facility];  Service: Endoscopy;  Laterality: N/A;  . GROIN MASS OPEN BIOPSY  2004  . IR IVC FILTER PLMT / S&I /IMG GUID/MOD SED  03/14/2018  . IR PARACENTESIS  03/10/2018  . JOINT REPLACEMENT    . LAPAROSCOPIC CHOLECYSTECTOMY    . PERIPHERAL VASCULAR INTERVENTION Left 03/05/2018   Procedure: PERIPHERAL VASCULAR INTERVENTION;  Surgeon: Elam Dutch, MD;  Location: [facility] CV LAB;  Service: Cardiovascular;  Laterality: Left;  Attempted unsuccess\ful Per Dr. Eden Lathe  . PERIPHERAL VASCULAR INTERVENTION  03/08/2018   Procedure: PERIPHERAL VASCULAR INTERVENTION;  Surgeon: Waynetta Sandy, MD;  Location: [place] CV LAB;  Service: Cardiovascular;;  SMA Stent   . REPLACEMENT TOTAL KNEE BILATERAL Bilateral 2012  . REVERSE SHOULDER ARTHROPLASTY  02/26/2012   Procedure: REVERSE SHOULDER ARTHROPLASTY;  Surgeon: Marin Shutter, MD;  Location: [facility];  Service: Orthopedics;  Laterality: Right;  RIGHT SHOULDER REVERSED ARTHROPLASTY  . SHOULDER ARTHROSCOPY WITH SUBACROMIAL DECOMPRESSION Left 02/10/2013   Procedure: SHOULDER ARTHROSCOPY WITH SUBACROMIAL DECOMPRESSION DISTAL CLAVICLE RESECTION;  Surgeon: Marin Shutter, MD;  Location: [facility];  Service: Orthopedics;  Laterality: Left;  DISTAL CLAVICLE RESECTION  . TONSILLECTOMY    . TOTAL HIP ARTHROPLASTY  2007   Right  . TOTAL HIP ARTHROPLASTY Left 02/07/2020   Procedure: TOTAL HIP ARTHROPLASTY ANTERIOR APPROACH;  Surgeon: Paralee Cancel, MD;  Location: [facility] ORS;  Service: Orthopedics;  Laterality: Left;  70 mins  . TRANSURETHRAL RESECTION OF BLADDER TUMOR N/A 05/31/2018   Procedure: TRANSURETHRAL RESECTION OF BLADDER TUMOR (TURBT);  Surgeon: Lucas Mallow, MD;  Location: [facility] OR[facility];  Service: Urology;  Laterality: N/A;  . VISCERAL  ANGIOGRAPHY N/A 03/05/2018   Procedure: VISCERAL ANGIOGRAPHY;  Surgeon: Elam Dutch, MD;  Location: [facility] CV LAB;  Service: Cardiovascular;  Laterality: N/A;  . VISCERAL ANGIOGRAPHY N/A 03/08/2018   Procedure: VISCERAL ANGIOGRAPHY;  Surgeon: Waynetta Sandy, MD;  Location: [facility] CV LAB;  Service: Cardiovascular;  Laterality: N/A;     OB History   No obstetric history on file.     Family History  Problem Relation Age of Onset  . Ovarian cancer Mother   . Lung cancer Father   . COPD Father   . Heart disease Father   . Heart disease Sister   . Stomach cancer Sister   . Colitis Sister     Social History   Tobacco Use  . Smoking status: Former Smoker    Packs/day: 1.00    Years: 45.00    Pack years: 45.00    Types: Cigarettes    Quit date: 12/22/2000    Years since quitting: 19.8  . Smokeless tobacco: Never Used  Vaping Use  . Vaping Use: Never used  Substance Use Topics  . Alcohol use: Never    Alcohol/week: 0.0 standard drinks  . Drug use: Never    Home Medications Prior to Admission medications   Medication Sig Start Date End Date Taking? Authorizing Provider  allopurinol (ZYLOPRIM) 100 MG tablet Take 100 mg by mouth daily. 01/30/17   [provider]  ARIPiprazole (ABILIFY) 2 MG tablet Take 2 mg by mouth daily. 12/29/19   [provider]  ascorbic acid (VITAMIN C) 500 MG tablet Take 500 mg by mouth daily.    [provider]  aspirin 81 MG chewable tablet Chew 1 tablet (81 mg total) by mouth daily. 10/14/20 11/13/20  Elodia Florence., MD  atorvastatin (LIPITOR) 20 MG tablet Take 20 mg by mouth daily. 09/21/20   [provider]  citalopram (CELEXA) 40 MG tablet Take 40 mg by mouth at bedtime. 08/30/19   [provider]  cyclobenzaprine (FLEXERIL) 10 MG tablet Take 10 mg by mouth 3 (three) times daily as needed for muscle spasms.     [provider]  ferrous sulfate (FERROUSUL) 325 (65 FE) MG  tablet Take 1 tablet (325 mg total) by mouth 3 (three) times daily with meals for 14 days. Patient taking differently: Take 325 mg by mouth daily.  02/08/20 10/09/20  Danae Orleans, PA-C  furosemide (LASIX) 40 MG tablet Take two tablets by mouth every Monday and  Thursday afternoon.  Take one tablet all other afternoons. Patient taking differently: Take 40-80 mg by mouth See admin instructions. Take 80mg  every Monday and Thursday afternoon, and 40mg  on all other afternoons. 03/13/20   Fay Records, MD  gabapentin (NEURONTIN) 100 MG capsule Take 100 mg by mouth daily. 01/09/20   [provider]  guaiFENesin (MUCINEX) 600 MG 12 hr tablet Take 600 mg by mouth 2 (two) times daily.     [provider]  INCRUSE ELLIPTA 62.5 MCG/INH AEPB Inhale 1 puff into the lungs 2 (two) times daily.  11/30/16   [provider]  Ipratropium-Albuterol (COMBIVENT) 20-100 MCG/ACT AERS respimat Inhale 1 puff into the lungs every 6 (six) hours as needed for wheezing or shortness of breath (or to help expel mucous).     [provider]  levothyroxine (SYNTHROID) 50 MCG tablet Take 1 tablet (50 mcg total) by mouth daily at 6 (six) AM. 09/13/20   Fay Records, MD  magnesium oxide (MAG-OX) 400 (241.3 Mg) MG tablet Take 1 tablet (400 mg total) by mouth 2 (two) times daily. 03/26/18   Mikhail, Velta Addison, DO  Melatonin 10 MG TABS Take 10 mg by mouth at bedtime.     [provider]  metolazone (ZAROXOLYN) 2.5 MG tablet Take 2.5 mg by mouth at bedtime.  08/22/20   [provider]  neomycin-polymyxin-hydrocortisone (CORTISPORIN) 3.5-10000-1 OTIC suspension Place 5 drops into the right ear daily as needed (ear pain).  08/31/19   [provider]  nitroGLYCERIN (NITROSTAT) 0.4 MG SL tablet Place 1 tablet (0.4 mg total) under the tongue every 5 (five) minutes as needed for chest pain. For chest pain 04/24/16   Fay Records, MD  ofloxacin (FLOXIN) 0.3 % OTIC solution Place 2 drops into the  left ear daily.  04/13/20   [provider]  omeprazole (PRILOSEC) 40 MG capsule Take 1 capsule (40 mg total) by mouth 2 (two) times daily. Patient taking differently: Take 40 mg by mouth every evening.  12/19/19   Ester[place], Amy S, PA-C  OXYGEN Inhale 4 L/min into the lungs as needed (DURING ALL TIMES OF EXERTION).     [provider]  pilocarpine (PILOCAR) 1 % ophthalmic solution Place 1 drop into both eyes 2 (two) times daily.    [provider]  polyethylene glycol (MIRALAX / GLYCOLAX) 17 g packet Take 17 g by mouth 2 (two) times daily. Patient taking differently: Take 17 g by mouth daily.  02/08/20   Danae Orleans, PA-C  potassium chloride SA (KLOR-CON) 20 MEQ tablet Take 20 mEq by mouth daily. 10/01/20   [provider]  pramipexole (MIRAPEX) 0.25 MG tablet Take 0.25 mg by mouth every evening.  08/22/20   [provider]  roflumilast (DALIRESP) 500 MCG TABS tablet Take 500 mcg by mouth every evening.     [provider]  SPIRIVA RESPIMAT 1.25 MCG/ACT AERS Inhale 2 puffs into the lungs daily.  08/24/20   [provider]  traZODone (DESYREL) 50 MG tablet Take 150 mg by mouth at bedtime.  10/07/18   [provider]  XTAMPZA ER 9 MG C12A Take 9 mg by mouth every 8 (eight) hours. 09/25/20   [provider]    Allergies    Ativan [lorazepam], Pitavastatin, Ropinirole, Zofran [ondansetron hcl], Zolpidem tartrate, and Penicillins  Review of Systems   Review of Systems  Constitutional: Negative for chills and fever.  HENT: Negative for ear pain and sore throat.   Eyes:  Negative for pain and visual disturbance.  Respiratory: Negative for cough and shortness of breath.   Cardiovascular: Positive for leg swelling. Negative for chest pain and palpitations.  Gastrointestinal: Negative for abdominal pain and vomiting.  Genitourinary: Negative for dysuria and hematuria.  Musculoskeletal: Positive for back pain and neck pain.  Negative for arthralgias.  Skin: Negative for color change and rash.  Neurological: Negative for seizures and syncope.  All other systems reviewed and are negative.   Physical Exam Updated Vital Signs BP (!) 81/32   Pulse 61   Temp (!) 97.4 F (36.3 C) (Temporal)   Resp 14   SpO2 100%   Physical Exam Vitals and nursing note reviewed.  Constitutional:      General: She is not in acute distress.    Appearance: She is well-developed.  HENT:     Head: Normocephalic and atraumatic.  Eyes:     Conjunctiva/sclera: Conjunctivae normal.  Cardiovascular:     Rate and Rhythm: Normal rate and regular rhythm.     Heart sounds: No murmur heard.   Pulmonary:     Effort: Pulmonary effort is normal. No respiratory distress.     Breath sounds: Normal breath sounds.  Abdominal:     Palpations: Abdomen is soft.     Tenderness: There is no abdominal tenderness.  Musculoskeletal:     Cervical back: Neck supple.     Comments: Mild edema to lower legs  Skin:    General: Skin is warm and dry.  Neurological:     Mental Status: She is alert.  Psychiatric:        Mood and Affect: Mood normal.        Behavior: Behavior normal.     ED Results / Procedures / Treatments   Labs (all labs ordered are listed, but only abnormal results are displayed) Labs Reviewed  CBC - Abnormal; Notable for the following components:      Result Value   RBC 3.36 (*)    Hemoglobin 9.8 (*)    HCT 31.9 (*)    RDW 15.9 (*)    All other components within normal limits  BASIC METABOLIC PANEL - Abnormal; Notable for the following components:   Potassium 3.2 (*)    Chloride 93 (*)    CO2 34 (*)    BUN 24 (*)    Creatinine, Ser 2.20 (*)    Calcium 8.7 (*)    GFR, Estimated 22 (*)    All other components within normal limits  BRAIN NATRIURETIC PEPTIDE - Abnormal; Notable for the following components:   B Natriuretic Peptide 339.0 (*)    All other components within normal limits  LACTIC ACID, PLASMA  LACTIC  ACID, PLASMA  TROPONIN I (HIGH SENSITIVITY)  TROPONIN I (HIGH SENSITIVITY)    EKG EKG Interpretation  Date/Time:  Sunday October 28 2020 01:11:30 EDT Ventricular Rate:  60 PR Interval:    QRS Duration: 114 QT Interval:  472 QTC Calculation: 472 R Axis:   -63 Text Interpretation: Sinus rhythm Supraventricular bigeminy Borderline prolonged PR interval Left anterior fascicular block Left ventricular hypertrophy Anterior Q waves, possibly due to LVH Confirmed by Madalyn Rob [phone]) on 10/28/2020 1:19:41 AM   Radiology DG Chest Portable 1 View  Result Date: 10/28/2020 CLINICAL DATA:  Shortness of breath EXAM: PORTABLE CHEST 1 VIEW COMPARISON:  10/10/2020 FINDINGS: The heart size and mediastinal contours are within normal limits. Both lungs are clear. The visualized skeletal structures are unremarkable. The patient is status post prior  median sternotomy. Aortic calcifications are noted. There are calcified bilateral breast implants. There is pleuroparenchymal scarring at the lung apices. There are end-stage degenerative changes of the left glenohumeral joint. The patient is status post prior total shoulder arthroplasty on the right. IMPRESSION: No active disease. Electronically Signed   By: Constance Holster M.D.   On: 10/28/2020 01:49    Procedures Procedures (including critical care time)  Medications Ordered in ED Medications  sodium chloride 0.9 % bolus 500 mL (0 mLs Intravenous Stopped 10/28/20 0242)  fentaNYL (SUBLIMAZE) injection 50 mcg (50 mcg Intravenous Given 10/28/20 0137)  sodium chloride 0.9 % bolus 500 mL (500 mLs Intravenous New Bag/Given 10/28/20 0253)    ED Course  I have reviewed the triage vital signs and the nursing notes.  Pertinent labs & imaging results that were available during my care of the patient were reviewed by me and considered in my medical decision making (see chart for details).    MDM Rules/Calculators/A&P                         79 year old  lady presents to ER with concern for low blood pressure.  Patient is otherwise asymptomatic and well-appearing.  Her lab work was concerning for acute kidney injury.  Suspect related to dehydration, possibly overdiuresis.  She was hypotensive initially in ER, provided gentle fluid bolus with improvement in her blood pressure.  Due to AKI, low blood pressure, will admit to hospitalist service for further management, will need careful titration of fluid status.  Consult to [facility] for admit.   Final Clinical Impression(s) / ED Diagnoses Final diagnoses:  AKI (acute kidney injury) ([place])  Dehydration    Rx / DC Orders ED Discharge Orders    None       Lucrezia Starch, MD 10/28/20 [phone]

## 2020-10-28 NOTE — H&P (Signed)
History and Physical    [name] XTG:[id number] DOB: Jun 15, 1941 DOA: 10/28/2020  PCP: [name], MD   Patient coming from: Home.   I have personally briefly reviewed patient's old medical records in [place]  Chief Complaint: Low blood pressure.  HPI: [name] is a 79 y.o. 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  She last took them yesterday Saturday, 10/27/2020.  She mentions that she has been lightheaded and weak for the past few days.  Lightheadedness is positional.  She has had some palpitations and more dyspnea than usual on exertion.  She denies nausea, vomiting or diarrhea.  However, she complains of some mild dysuria and urinary tenesmus.  She denies chest pain, diaphoresis, PND, orthopnea.  She states that her lower extremity edema is better.  ED Course: Initial vital signs were 97.4 F, pulse 81, respiration 18, blood pressure 91/72 mmHg O2 sat 100% on 4 L/min via nasal cannula.  The patient received a 500 mL bolus and 50 mcg of fentanyl.  CBC showed a white count of 9.3, hemoglobin 9.9 g/dL and platelets 225.  Troponin #1 was normal.  BNP 339.0 pg/mL.  Sodium one hundred and thirty-eight, potassium 3.2, chloride ninety-three and CO2 34 mmol/L.  Glucose seventy-six, BUN twenty-four and  creatinine 2.20 mg/dL.  No active disease seen on chest radiograph.  Are you team reevaluated by which team are you today.  I will get these 1 days  Review of Systems: As per HPI otherwise all other systems reviewed and are negative.  Past Medical History:  Diagnosis Date  . Anemia    bld. transfusion post lumbar surgery- 2012  . Anxiety   . Arthralgia    NOS  . Blood transfusion 2012; 02/2018   WITH BACK SURGERY; "related to hematoma in stomach" (10/06/2018)  . CAD (coronary artery disease)    s/p CABG 2004; s/p DES to LM in 2010;  [place] 10/29/11: EF 50-55%, mild elevated filling pressures, no pulmonary HTN, LM 90% ISR, LAD and CFX occluded, S-RI occluded (old), S-OM3 ok and L-LAD ok, native nondominant RCA 95% -  med rx recommended ; Lexiscan Myoview 7/13 at [facility]: demonstrated "normal LV function, anterior attenuation and localized ischemia, inferior, basilar, mid section"  . Carotid artery disease ([place])    Carotid US 2/70:  RICA 3-50; LICA 09-38; R subclavian stenosis - Repeat 1 year. // Carotid US 05/2019: R 1-39; L 40-59; R vertebral with atypical antegrade flow; R subclavian stenosis >> repeat 1 year // Carotid US 7/21: Bilat 40-59; L subclavian stenosis   . CHF (congestive heart failure) ([place]) 01/21/2020  . Chronic diastolic heart failure (HCC)    Echo 9/10: EF 18-29%, grade 1 diastolic dysfunction  . Chronic lower back pain   . COPD (chronic obstructive pulmonary disease) ([place])    Emphysema dxed by Dr. Woody Seller in [place] based on PFTs per pt in 2006;  placed on albuterol  . Depression    TAKES CELEXA  AND  (OFF- WELBUTRIN)  . Diuretic-induced hypokalemia 10/08/2018  . DVT of lower extremity (deep venous thrombosis) (HCC)    recurrent. bilateral (2 episodes)  . Dyspnea    home o2 when needed  . Dysrhythmia    afib with cabg  . Exertional angina (HCC)    Treated with Isosorbide, Ranexa, amlodipine; intolerant to metoprolol  . GERD (gastroesophageal reflux disease)   . Gout     "on daily RX" (10/06/2018)  . HLD (hyperlipidemia)   . Hypertension   . Hyperthyroidism   . L Subclavian Artery Stenosis    Carotid US 7/21   . Obesity (BMI 30-39.9) 2009   BMI 33  . Osteoarthritis    "all over; hands" (10/06/2018)  . Oxygen deficiency   . Oxygen dependent    4L at home as needed (10/06/2018)  . Pneumonia   . PVD (peripheral vascular disease) ([place])   . Sleep apnea 2012   USED CPAP THEN  STARTED USING SPIRIVA , Nov. 2013- last evaluation , changed from mask to aparatus that is just for her nose.. ; reports 05-28-18 "the mask smothers me so i dont use it right now" (10/06/2018)   Past Surgical History:  Procedure Laterality Date  . ANKLE SURGERY  2004  . ANTERIOR CERVICAL DECOMP/DISCECTOMY FUSION  11/2007  . AUGMENTATION MAMMAPLASTY    . [place]   lower; another scheduled, opt. reports 4 back- lumbar, 3 cerv. fusions  for later 2009  . CARDIAC CATHETERIZATION  11/2009   Patent LIMA to LAD and patent SVG to OM1. Occluded SVG to ramus and diagonal. Left main: 90% ostial stenosis, LCX 60-70% proximal stenosis  . CATARACT EXTRACTION W/ INTRAOCULAR LENS  IMPLANT, BILATERAL Bilateral 2006  . COLONOSCOPY WITH PROPOFOL N/A 02/17/2017   Procedure: COLONOSCOPY WITH PROPOFOL;  Surgeon: Jerene Bears, MD;  Location: [name] ENDOSCOPY;  Service: Endoscopy;  Laterality: N/A;  . CORONARY ANGIOPLASTY WITH STENT PLACEMENT  11/2009   Drug eluting stent to left main artery: 4.0 X 12 mm Ion   . CORONARY ARTERY BYPASS GRAFT  2004   "CABG X4"  . ESOPHAGOGASTRODUODENOSCOPY N/A 02/14/2017   Procedure: ESOPHAGOGASTRODUODENOSCOPY (EGD);  Surgeon: Doran Stabler, MD;  Location: [facility] ENDOSCOPY;  Service: Endoscopy;  Laterality: N/A;  . GIVENS CAPSULE STUDY N/A 02/15/2017   Procedure: GIVENS CAPSULE STUDY;  Surgeon: Doran Stabler, MD;  Location: [place];  Service: Endoscopy;  Laterality: N/A;  . GROIN MASS OPEN BIOPSY  2004  . IR IVC FILTER PLMT / S&I /IMG GUID/MOD SED   03/14/2018  . IR PARACENTESIS  03/10/2018  . JOINT REPLACEMENT    . LAPAROSCOPIC CHOLECYSTECTOMY    . PERIPHERAL VASCULAR INTERVENTION Left 03/05/2018   Procedure: PERIPHERAL VASCULAR INTERVENTION;  Surgeon: Elam Dutch, MD;  Location: [place] CV LAB;  Service: Cardiovascular;  Laterality: Left;  Attempted unsuccess\ful Per Dr. Eden Lathe  . PERIPHERAL VASCULAR INTERVENTION  03/08/2018   Procedure: PERIPHERAL VASCULAR INTERVENTION;  Surgeon: Waynetta Sandy, MD;  Location: [place] CV LAB;  Service: Cardiovascular;;  SMA Stent   . REPLACEMENT TOTAL KNEE BILATERAL Bilateral 2012  . REVERSE SHOULDER ARTHROPLASTY  02/26/2012   Procedure: REVERSE SHOULDER ARTHROPLASTY;  Surgeon: Marin Shutter, MD;  Location: [place];  Service: Orthopedics;  Laterality: Right;  RIGHT SHOULDER REVERSED ARTHROPLASTY  . SHOULDER ARTHROSCOPY WITH SUBACROMIAL DECOMPRESSION Left 02/10/2013   Procedure: SHOULDER ARTHROSCOPY WITH SUBACROMIAL DECOMPRESSION DISTAL  CLAVICLE RESECTION;  Surgeon: Marin Shutter, MD;  Location: [place];  Service: Orthopedics;  Laterality: Left;  DISTAL CLAVICLE RESECTION  . TONSILLECTOMY    . TOTAL HIP ARTHROPLASTY  2007   Right  . TOTAL HIP ARTHROPLASTY Left 02/07/2020   Procedure: TOTAL HIP ARTHROPLASTY ANTERIOR APPROACH;  Surgeon: Paralee Cancel, MD;  Location: [facility];  Service: Orthopedics;  Laterality: Left;  70 mins  . TRANSURETHRAL RESECTION OF BLADDER TUMOR N/A 05/31/2018   Procedure: TRANSURETHRAL RESECTION OF BLADDER TUMOR (TURBT);  Surgeon: Lucas Mallow, MD;  Location: [facility];  Service: Urology;  Laterality: N/A;  . VISCERAL ANGIOGRAPHY N/A 03/05/2018   Procedure: VISCERAL ANGIOGRAPHY;  Surgeon: Elam Dutch, MD;  Location: [place] CV LAB;  Service: Cardiovascular;  Laterality: N/A;  . VISCERAL ANGIOGRAPHY N/A 03/08/2018   Procedure: VISCERAL ANGIOGRAPHY;  Surgeon: Waynetta Sandy, MD;  Location: [place] CV LAB;  Service: Cardiovascular;  Laterality:  N/A;    Social History  reports that she quit smoking about 19 years ago. Her smoking use included cigarettes. She has a 45.00 pack-year smoking history. She has never used smokeless tobacco. She reports that she does not drink alcohol and does not use drugs.  Allergies  Allergen Reactions  . Ativan [Lorazepam] Other (See Comments)    Confusion   . Pitavastatin Other (See Comments)    LIVALO: reaction not recalled  . Ropinirole Nausea Only and Other (See Comments)    Makes legs jump, also  . Zofran [Ondansetron Hcl] Nausea Only  . Zolpidem Tartrate Anxiety and Other (See Comments)    CONFUSION   . Penicillins Rash and Other (See Comments)    Tolerated cephalosporins in past Has patient had a PCN reaction causing immediate rash, facial/tongue/throat swelling, SOB or lightheadedness with hypotension: Yes Has patient had a PCN reaction causing severe rash involving mucus membranes or skin necrosis: No Has patient had a PCN reaction that required hospitalization: No Has patient had a PCN reaction occurring within the last 10 years: No If all of the above answers are "NO", then may proceed with Cephalosporin use.     Family History  Problem Relation Age of Onset  . Ovarian cancer Mother   . Lung cancer Father   . COPD Father   . Heart disease Father   . Heart disease Sister   . Stomach cancer Sister   . Colitis Sister    Prior to Admission medications   Medication Sig Start Date End Date Taking? Authorizing Provider  allopurinol (ZYLOPRIM) 100 MG tablet Take 100 mg by mouth daily. 01/30/17  Yes [provider]  ARIPiprazole (ABILIFY) 2 MG tablet Take 2 mg by mouth daily. 12/29/19  Yes [provider]  ascorbic acid (VITAMIN C) 500 MG tablet Take 500 mg by mouth daily.   Yes [provider]  aspirin 81 MG chewable tablet Chew 1 tablet (81 mg total) by mouth daily. 10/14/20 11/13/20 Yes Elodia Florence., MD  atorvastatin (LIPITOR) 20 MG tablet Take 20  mg by mouth daily. 09/21/20  Yes [provider]  citalopram (CELEXA) 40 MG tablet Take 40 mg by mouth at bedtime. 08/30/19  Yes [provider]  cyclobenzaprine (FLEXERIL) 10 MG tablet Take 10 mg by mouth 3 (three) times daily as needed for muscle spasms.    Yes [provider]  ferrous sulfate (FERROUSUL) 325 (65 FE) MG tablet Take 1 tablet (325 mg total) by mouth 3 (three) times daily with meals for 14  days. Patient taking differently: Take 325 mg by mouth daily.  02/08/20 10/28/20 Yes Babish, Rodman Key, PA-C  furosemide (LASIX) 40 MG tablet Take two tablets by mouth every Monday and Thursday afternoon.  Take one tablet all other afternoons. Patient taking differently: Take 40-80 mg by mouth See admin instructions. Take 80mg  every Monday and Thursday afternoon, and 40mg  on all other afternoons. 03/13/20  Yes Fay Records, MD  gabapentin (NEURONTIN) 100 MG capsule Take 100 mg by mouth daily. 01/09/20  Yes [provider]  guaiFENesin (MUCINEX) 600 MG 12 hr tablet Take 600 mg by mouth 2 (two) times daily.    Yes [provider]  INCRUSE ELLIPTA 62.5 MCG/INH AEPB Inhale 1 puff into the lungs 2 (two) times daily.  11/30/16  Yes [provider]  Ipratropium-Albuterol (COMBIVENT) 20-100 MCG/ACT AERS respimat Inhale 1 puff into the lungs every 6 (six) hours as needed for wheezing or shortness of breath (or to help expel mucous).    Yes [provider]  levothyroxine (SYNTHROID) 50 MCG tablet Take 1 tablet (50 mcg total) by mouth daily at 6 (six) AM. 09/13/20  Yes Fay Records, MD  magnesium oxide (MAG-OX) 400 (241.3 Mg) MG tablet Take 1 tablet (400 mg total) by mouth 2 (two) times daily. 03/26/18  Yes Mikhail, Velta Addison, DO  Melatonin 10 MG TABS Take 10 mg by mouth at bedtime.    Yes [provider]  metolazone (ZAROXOLYN) 2.5 MG tablet Take 2.5 mg by mouth at bedtime.  08/22/20  Yes [provider]  neomycin-polymyxin-hydrocortisone  (CORTISPORIN) 3.5-10000-1 OTIC suspension Place 5 drops into the right ear daily as needed (ear pain).  08/31/19  Yes [provider]  nitroGLYCERIN (NITROSTAT) 0.4 MG SL tablet Place 1 tablet (0.4 mg total) under the tongue every 5 (five) minutes as needed for chest pain. For chest pain 04/24/16  Yes Fay Records, MD  ofloxacin (FLOXIN) 0.3 % OTIC solution Place 2 drops into the left ear daily.  04/13/20  Yes [provider]  omeprazole (PRILOSEC) 40 MG capsule Take 1 capsule (40 mg total) by mouth 2 (two) times daily. Patient taking differently: Take 40 mg by mouth every evening.  12/19/19  Yes [place], Amy S, PA-C  OXYGEN Inhale 4 L/min into the lungs as needed (DURING ALL TIMES OF EXERTION).    Yes [provider]  pilocarpine (PILOCAR) 1 % ophthalmic solution Place 1 drop into both eyes 2 (two) times daily.   Yes [provider]  polyethylene glycol (MIRALAX / GLYCOLAX) 17 g packet Take 17 g by mouth 2 (two) times daily. Patient taking differently: Take 17 g by mouth daily.  02/08/20  Yes Babish, Rodman Key, PA-C  potassium chloride SA (KLOR-CON) 20 MEQ tablet Take 20 mEq by mouth daily. 10/01/20  Yes [provider]  pramipexole (MIRAPEX) 0.25 MG tablet Take 0.25 mg by mouth every evening.  08/22/20  Yes [provider]  roflumilast (DALIRESP) 500 MCG TABS tablet Take 500 mcg by mouth every evening.    Yes [provider]  SPIRIVA RESPIMAT 1.25 MCG/ACT AERS Inhale 2 puffs into the lungs daily.  08/24/20  Yes [provider]  traZODone (DESYREL) 50 MG tablet Take 150 mg by mouth at bedtime.  10/07/18  Yes [provider]  XTAMPZA ER 9 MG C12A Take 9 mg by mouth every 8 (eight) hours. 09/25/20  Yes [provider]   Physical Exam: Vitals:   10/28/20 0130 10/28/20 0145 10/28/20 0230 10/28/20 0245  BP: (!) 86/38 (!) 89/39 (!) 89/38 (!) 81/32  Pulse: 66 63 64 61  Resp: 14 14 14 14   Temp:      TempSrc:      SpO2:  100% 100% 100% 100%   Constitutional: NAD, calm, comfortable Eyes: PERRL, lids and conjunctivae normal ENMT: Mucous membranes are moist. Posterior pharynx clear of any exudate or lesions. Neck: normal, supple, no masses, no thyromegaly Respiratory: clear to auscultation bilaterally, no wheezing, no crackles. Normal respiratory effort. No accessory muscle use.  Cardiovascular: Regular rate and rhythm, no murmurs / rubs / gallops. No pitting lower extremity edema.  Stage I lymphedema.  2+ pedal pulses. No carotid bruits.  Abdomen: Positive suprapubic tenderness.  Tenderness, no masses palpated. No hepatosplenomegaly. Bowel sounds positive.  Musculoskeletal: Generalized nonfocal weakness.  No clubbing / cyanosis.Good ROM, no contractures. Normal muscle tone.  Skin: Venous stasis dermatitis on lower extremities.  See picture below. Neurologic: CN 2-12 grossly intact. Sensation intact, DTR normal. Strength 4/5 in all 4.  Psychiatric: Normal judgment and insight. Alert and oriented x 3. Normal mood.    Labs on Admission: I have personally reviewed following labs and imaging studies  CBC: Recent Labs  Lab 10/28/20 0108  WBC 9.3  HGB 9.8*  HCT 31.9*  MCV 94.9  PLT 939    Basic Metabolic Panel: Recent Labs  Lab 10/28/20 0108  NA 138  K 3.2*  CL 93*  CO2 34*  GLUCOSE 76  BUN 24*  CREATININE 2.20*  CALCIUM 8.7*    GFR: CrCl cannot be calculated (Unknown ideal weight.).  Liver Function Tests: No results for input(s): AST, ALT, ALKPHOS, BILITOT, PROT, ALBUMIN in the last 168 hours.  Radiological Exams on Admission: DG Chest Portable 1 View  Result Date: 10/28/2020 CLINICAL DATA:  Shortness of breath EXAM: PORTABLE CHEST 1 VIEW COMPARISON:  10/10/2020 FINDINGS: The heart size and mediastinal contours are within normal limits. Both lungs are clear. The visualized skeletal structures are unremarkable. The patient is status post prior median sternotomy. Aortic calcifications are  noted. There are calcified bilateral breast implants. There is pleuroparenchymal scarring at the lung apices. There are end-stage degenerative changes of the left glenohumeral joint. The patient is status post prior total shoulder arthroplasty on the right. IMPRESSION: No active disease. Electronically Signed   By: Constance Holster M.D.   On: 10/28/2020 01:49   EKG: Independently reviewed.  Vent. rate 60 BPM PR interval * ms QRS duration 114 ms QT/QTc 472/472 ms P-R-T axes -35 -63 59 Sinus rhythm Supraventricular bigeminy Borderline prolonged PR interval Left anterior fascicular block Left ventricular hypertrophy Anterior Q waves, possibly due to LVH  Assessment/Plan Principal Problem:   AKI (acute kidney injury) ([place]) Observation/telemetry. Hold furosemide and metolazone. Continue gentle IV hydration. Avoid nephrotoxic medications. Monitor renal function electrolytes.  Active Problems:   Hypokalemia Continue KCl 20 mEq p.o. daily. Taking magnesium oxide 250 mg daily. Monitor potassium level.    Hypothyroidism Continue levothyroxine 50 mcg p.o. daily.    Essential hypertension Holding diuretics. Monitor blood pressure. Continue gentle IV hydration.    COPD (chronic obstructive pulmonary disease) (HCC) Continue daily Daliresp, Spiriva and Combivent.    Sleep apnea No longer on CPAP.    Dyslipidemia Continue atorvastatin 20 mg p.o. daily    Chronic diastolic heart failure (HCC) Volume depleted. Hold diuretics.    GERD without esophagitis Pantoprazole 40 mg p.o. daily.    History of pulmonary embolism and DVT. SCDs due to history of GI bleeds. The  patient has history of IVC placement.    Normocytic anemia Monitor H&H. Transfuse as needed.    DVT prophylaxis: SCDs. Code Status:   Full code. Family Communication: Disposition Plan:   Patient is from:  Home.  Anticipated DC to:  Home.  Anticipated DC date:  10/29/2020.  Anticipated DC  barriers: Clinical status and renal function tests.  Consults called: Admission status:  Observation/telemetry.  Severity of Illness:  Reubin Milan MD [facility]  How to contact the [facility] Attending or Consulting provider [place] or covering provider during after hours [employer], for this patient?   1. Check the care team in [facility] and look for a) attending/consulting [facility] provider listed and b) the [facility] team listed 2. Log into [email or web address] and use [place]'s universal password to access. If you do not have the password, please contact the hospital operator. 3. Locate the [facility] provider you are looking for under [facility] and page to a number that you can be directly reached. 4. If you still have difficulty reaching the provider, please page the [facility] (Director on Call) for the Hospitalists listed on amion for assistance.  10/28/2020, 4:41 AM   This document was prepared using Dragon voice recognition software and may contain some unintended transcription errors.

## 2020-10-28 NOTE — Consult Note (Signed)
Hospital Consult    Reason for Consult: Bilateral lower extremity swelling Referring Physician: Dr. Waldron Labs MRN #:  [id number]  History of Present Illness: This is a 79 y.o. female known to our service from history of superior mesenteric artery stenosis status post stenting.  She also had a previous left lower extremity DVT and IVC filter put was placed in 2019 secondary to rectus sheath hematoma.  More recently she has had swelling of her bilateral lower extremities she tells me for several weeks.  She has been placed on increased doses of Lasix was noted to have syncopal episode today and is now admitted to the hospital with persistent bilateral lower extremity edema and acute kidney injury.  Patient is somewhat somnolent on my exam limiting history.  She does think she is possibly had a saphenous vein harvested for coronary artery disease in the past.  Past Medical History:  Diagnosis Date  . Anemia    bld. transfusion post lumbar surgery- 2012  . Anxiety   . Arthralgia    NOS  . Blood transfusion 2012; 02/2018   WITH BACK SURGERY; "related to hematoma in stomach" (10/06/2018)  . CAD (coronary artery disease)    s/p CABG 2004; s/p DES to LM in 2010;  [place] 10/29/11: EF 50-55%, mild elevated filling pressures, no pulmonary HTN, LM 90% ISR, LAD and CFX occluded, S-RI occluded (old), S-OM3 ok and L-LAD ok, native nondominant RCA 95% -  med rx recommended ; Lexiscan Myoview 7/13 at [facility]: demonstrated "normal LV function, anterior attenuation and localized ischemia, inferior, basilar, mid section"  . Carotid artery disease ([place])    Carotid US 9/79:  RICA 8-92; LICA 11-94; R subclavian stenosis - Repeat 1 year. // Carotid US 05/2019: R 1-39; L 40-59; R vertebral with atypical antegrade flow; R subclavian stenosis >> repeat 1 year // Carotid US 7/21: Bilat 40-59; L subclavian stenosis   . CHF (congestive heart failure) ([place]) 01/21/2020  . Chronic diastolic heart failure (HCC)    Echo  9/10: EF 17-40%, grade 1 diastolic dysfunction  . Chronic lower back pain   . COPD (chronic obstructive pulmonary disease) ([place])    Emphysema dxed by Dr. Woody Seller in [place] based on PFTs per pt in 2006; placed on albuterol  . Depression    TAKES CELEXA  AND  (OFF- WELBUTRIN)  . Diuretic-induced hypokalemia 10/08/2018  . DVT of lower extremity (deep venous thrombosis) (HCC)    recurrent. bilateral (2 episodes)  . Dyspnea    home o2 when needed  . Dysrhythmia    afib with cabg  . Exertional angina (HCC)    Treated with Isosorbide, Ranexa, amlodipine; intolerant to metoprolol  . GERD (gastroesophageal reflux disease)   . Gout    "on daily RX" (10/06/2018)  . HLD (hyperlipidemia)   . Hypertension   . Hyperthyroidism   . L Subclavian Artery Stenosis    Carotid US 7/21   . Obesity (BMI 30-39.9) 2009   BMI 33  . Osteoarthritis    "all over; hands" (10/06/2018)  . Oxygen deficiency   . Oxygen dependent    4L at home as needed (10/06/2018)  . Pneumonia   . PVD (peripheral vascular disease) ([place])   . Sleep apnea 2012   USED CPAP THEN  STARTED USING SPIRIVA , Nov. 2013- last evaluation , changed from mask to aparatus that is just for her nose.. ; reports 05-28-18 "the mask smothers me so i dont use it right now" (10/06/2018)    Past  Surgical History:  Procedure Laterality Date  . ANKLE SURGERY  2004  . ANTERIOR CERVICAL DECOMP/DISCECTOMY FUSION  11/2007  . AUGMENTATION MAMMAPLASTY    . [place]   lower; another scheduled, opt. reports 4 back- lumbar, 3 cerv. fusions  for later 2009  . CARDIAC CATHETERIZATION  11/2009   Patent LIMA to LAD and patent SVG to OM1. Occluded SVG to ramus and diagonal. Left main: 90% ostial stenosis, LCX 60-70% proximal stenosis  . CATARACT EXTRACTION W/ INTRAOCULAR LENS  IMPLANT, BILATERAL Bilateral 2006  . COLONOSCOPY WITH PROPOFOL N/A 02/17/2017   Procedure: COLONOSCOPY WITH PROPOFOL;  Surgeon: Jerene Bears, MD;  Location: [facility] ENDOSCOPY;  Service:  Endoscopy;  Laterality: N/A;  . CORONARY ANGIOPLASTY WITH STENT PLACEMENT  11/2009   Drug eluting stent to left main artery: 4.0 X 12 mm Ion   . CORONARY ARTERY BYPASS GRAFT  2004   "CABG X4"  . ESOPHAGOGASTRODUODENOSCOPY N/A 02/14/2017   Procedure: ESOPHAGOGASTRODUODENOSCOPY (EGD);  Surgeon: Doran Stabler, MD;  Location: [place] ENDOSCOPY;  Service: Endoscopy;  Laterality: N/A;  . GIVENS CAPSULE STUDY N/A 02/15/2017   Procedure: GIVENS CAPSULE STUDY;  Surgeon: Doran Stabler, MD;  Location: [place];  Service: Endoscopy;  Laterality: N/A;  . GROIN MASS OPEN BIOPSY  2004  . IR IVC FILTER PLMT / S&I /IMG GUID/MOD SED  03/14/2018  . IR PARACENTESIS  03/10/2018  . JOINT REPLACEMENT    . LAPAROSCOPIC CHOLECYSTECTOMY    . PERIPHERAL VASCULAR INTERVENTION Left 03/05/2018   Procedure: PERIPHERAL VASCULAR INTERVENTION;  Surgeon: Elam Dutch, MD;  Location: [place] CV LAB;  Service: Cardiovascular;  Laterality: Left;  Attempted unsuccess\ful Per Dr. Eden Lathe  . PERIPHERAL VASCULAR INTERVENTION  03/08/2018   Procedure: PERIPHERAL VASCULAR INTERVENTION;  Surgeon: Waynetta Sandy, MD;  Location: [place] CV LAB;  Service: Cardiovascular;;  SMA Stent   . REPLACEMENT TOTAL KNEE BILATERAL Bilateral 2012  . REVERSE SHOULDER ARTHROPLASTY  02/26/2012   Procedure: REVERSE SHOULDER ARTHROPLASTY;  Surgeon: Marin Shutter, MD;  Location: [place];  Service: Orthopedics;  Laterality: Right;  RIGHT SHOULDER REVERSED ARTHROPLASTY  . SHOULDER ARTHROSCOPY WITH SUBACROMIAL DECOMPRESSION Left 02/10/2013   Procedure: SHOULDER ARTHROSCOPY WITH SUBACROMIAL DECOMPRESSION DISTAL CLAVICLE RESECTION;  Surgeon: Marin Shutter, MD;  Location: [place];  Service: Orthopedics;  Laterality: Left;  DISTAL CLAVICLE RESECTION  . TONSILLECTOMY    . TOTAL HIP ARTHROPLASTY  2007   Right  . TOTAL HIP ARTHROPLASTY Left 02/07/2020   Procedure: TOTAL HIP ARTHROPLASTY ANTERIOR APPROACH;  Surgeon: Paralee Cancel, MD;  Location: [facility]  ORS;  Service: Orthopedics;  Laterality: Left;  70 mins  . TRANSURETHRAL RESECTION OF BLADDER TUMOR N/A 05/31/2018   Procedure: TRANSURETHRAL RESECTION OF BLADDER TUMOR (TURBT);  Surgeon: Lucas Mallow, MD;  Location: [facility] ORS;  Service: Urology;  Laterality: N/A;  . VISCERAL ANGIOGRAPHY N/A 03/05/2018   Procedure: VISCERAL ANGIOGRAPHY;  Surgeon: Elam Dutch, MD;  Location: [place] CV LAB;  Service: Cardiovascular;  Laterality: N/A;  . VISCERAL ANGIOGRAPHY N/A 03/08/2018   Procedure: VISCERAL ANGIOGRAPHY;  Surgeon: Waynetta Sandy, MD;  Location: [place] CV LAB;  Service: Cardiovascular;  Laterality: N/A;    Allergies  Allergen Reactions  . Ativan [Lorazepam] Other (See Comments)    Confusion   . Pitavastatin Other (See Comments)    LIVALO: reaction not recalled  . Ropinirole Nausea Only and Other (See Comments)    Makes legs jump, also  . Zofran Alvis Lemmings  Hcl] Nausea Only  . Zolpidem Tartrate Anxiety and Other (See Comments)    CONFUSION   . Penicillins Rash and Other (See Comments)    Tolerated cephalosporins in past Has patient had a PCN reaction causing immediate rash, facial/tongue/throat swelling, SOB or lightheadedness with hypotension: Yes Has patient had a PCN reaction causing severe rash involving mucus membranes or skin necrosis: No Has patient had a PCN reaction that required hospitalization: No Has patient had a PCN reaction occurring within the last 10 years: No If all of the above answers are "NO", then may proceed with Cephalosporin use.     Prior to Admission medications   Medication Sig Start Date End Date Taking? Authorizing Provider  allopurinol (ZYLOPRIM) 100 MG tablet Take 100 mg by mouth daily. 01/30/17  Yes [provider]  ARIPiprazole (ABILIFY) 2 MG tablet Take 2 mg by mouth daily. 12/29/19  Yes [provider]  ascorbic acid (VITAMIN C) 500 MG tablet Take 500 mg by mouth daily.   Yes [provider]   aspirin 81 MG chewable tablet Chew 1 tablet (81 mg total) by mouth daily. 10/14/20 11/13/20 Yes Elodia Florence., MD  atorvastatin (LIPITOR) 20 MG tablet Take 20 mg by mouth daily. 09/21/20  Yes [provider]  citalopram (CELEXA) 40 MG tablet Take 40 mg by mouth at bedtime. 08/30/19  Yes [provider]  cyclobenzaprine (FLEXERIL) 10 MG tablet Take 10 mg by mouth 3 (three) times daily as needed for muscle spasms.    Yes [provider]  ferrous sulfate (FERROUSUL) 325 (65 FE) MG tablet Take 1 tablet (325 mg total) by mouth 3 (three) times daily with meals for 14 days. Patient taking differently: Take 325 mg by mouth daily.  02/08/20 10/28/20 Yes Babish, Rodman Key, PA-C  furosemide (LASIX) 40 MG tablet Take two tablets by mouth every Monday and Thursday afternoon.  Take one tablet all other afternoons. Patient taking differently: Take 40-80 mg by mouth See admin instructions. Take 80mg  every Monday and Thursday afternoon, and 40mg  on all other afternoons. 03/13/20  Yes Fay Records, MD  gabapentin (NEURONTIN) 100 MG capsule Take 100 mg by mouth daily. 01/09/20  Yes [provider]  guaiFENesin (MUCINEX) 600 MG 12 hr tablet Take 600 mg by mouth 2 (two) times daily.    Yes [provider]  INCRUSE ELLIPTA 62.5 MCG/INH AEPB Inhale 1 puff into the lungs 2 (two) times daily.  11/30/16  Yes [provider]  Ipratropium-Albuterol (COMBIVENT) 20-100 MCG/ACT AERS respimat Inhale 1 puff into the lungs every 6 (six) hours as needed for wheezing or shortness of breath (or to help expel mucous).    Yes [provider]  levothyroxine (SYNTHROID) 50 MCG tablet Take 1 tablet (50 mcg total) by mouth daily at 6 (six) AM. 09/13/20  Yes Fay Records, MD  magnesium oxide (MAG-OX) 400 (241.3 Mg) MG tablet Take 1 tablet (400 mg total) by mouth 2 (two) times daily. 03/26/18  Yes Mikhail, Velta Addison, DO  Melatonin 10 MG TABS Take 10 mg by mouth at bedtime.    Yes  [provider]  metolazone (ZAROXOLYN) 2.5 MG tablet Take 2.5 mg by mouth at bedtime.  08/22/20  Yes [provider]  neomycin-polymyxin-hydrocortisone (CORTISPORIN) 3.5-10000-1 OTIC suspension Place 5 drops into the right ear daily as needed (ear pain).  08/31/19  Yes [provider]  nitroGLYCERIN (NITROSTAT) 0.4 MG SL tablet Place 1 tablet (0.4 mg total) under the tongue every 5 (five)  minutes as needed for chest pain. For chest pain 04/24/16  Yes Fay Records, MD  ofloxacin (FLOXIN) 0.3 % OTIC solution Place 2 drops into the left ear daily.  04/13/20  Yes [provider]  omeprazole (PRILOSEC) 40 MG capsule Take 1 capsule (40 mg total) by mouth 2 (two) times daily. Patient taking differently: Take 40 mg by mouth every evening.  12/19/19  Yes Esterwood, Amy S, PA-C  OXYGEN Inhale 4 L/min into the lungs as needed (DURING ALL TIMES OF EXERTION).    Yes [provider]  pilocarpine (PILOCAR) 1 % ophthalmic solution Place 1 drop into both eyes 2 (two) times daily.   Yes [provider]  polyethylene glycol (MIRALAX / GLYCOLAX) 17 g packet Take 17 g by mouth 2 (two) times daily. Patient taking differently: Take 17 g by mouth daily.  02/08/20  Yes Babish, Rodman Key, PA-C  potassium chloride SA (KLOR-CON) 20 MEQ tablet Take 20 mEq by mouth daily. 10/01/20  Yes [provider]  pramipexole (MIRAPEX) 0.25 MG tablet Take 0.25 mg by mouth every evening.  08/22/20  Yes [provider]  roflumilast (DALIRESP) 500 MCG TABS tablet Take 500 mcg by mouth every evening.    Yes [provider]  SPIRIVA RESPIMAT 1.25 MCG/ACT AERS Inhale 2 puffs into the lungs daily.  08/24/20  Yes [provider]  traZODone (DESYREL) 50 MG tablet Take 150 mg by mouth at bedtime.  10/07/18  Yes [provider]  XTAMPZA ER 9 MG C12A Take 9 mg by mouth every 8 (eight) hours. 09/25/20  Yes [provider]    Social History   Socioeconomic  History  . Marital status: Widowed    Spouse name: Not on file  . Number of children: 3  . Years of education: Not on file  . Highest education level: Not on file  Occupational History  . Occupation: retired    Comment: Electronics engineer: RETIRED  Tobacco Use  . Smoking status: Former Smoker    Packs/day: 1.00    Years: 45.00    Pack years: 45.00    Types: Cigarettes    Quit date: 12/22/2000    Years since quitting: 19.8  . Smokeless tobacco: Never Used  Vaping Use  . Vaping Use: Never used  Substance and Sexual Activity  . Alcohol use: Never    Alcohol/week: 0.0 standard drinks  . Drug use: Never  . Sexual activity: Not Currently  Other Topics Concern  . Not on file  Social History Narrative      3 grown children.   Social Determinants of Health   Financial Resource Strain:   . Difficulty of Paying Living Expenses: Not on file  Food Insecurity:   . Worried About Charity fundraiser in the Last Year: Not on file  . Ran Out of Food in the Last Year: Not on file  Transportation Needs: No Transportation Needs  . Lack of Transportation (Medical): No  . Lack of Transportation (Non-Medical): No  Physical Activity:   . Days of Exercise per Week: Not on file  . Minutes of Exercise per Session: Not on file  Stress:   . Feeling of Stress : Not on file  Social Connections:   . Frequency of Communication with Friends and Family: Not on file  . Frequency of Social Gatherings with Friends and Family: Not on file  . Attends Religious Services: Not on file  . Active Member of Clubs or Organizations:  Not on file  . Attends Archivist Meetings: Not on file  . Marital Status: Not on file  Intimate Partner Violence:   . Fear of Current or Ex-Partner: Not on file  . Emotionally Abused: Not on file  . Physically Abused: Not on file  . Sexually Abused: Not on file    Family History  Problem Relation Age of Onset  . Ovarian cancer Mother   . Lung cancer Father    . COPD Father   . Heart disease Father   . Heart disease Sister   . Stomach cancer Sister   . Colitis Sister     ROS: Cardiovascular: []  chest pain/pressure []  palpitations []  SOB lying flat []  DOE []  pain in legs while walking [] x pain in legs at rest []  pain in legs at night []  non-healing ulcers [x]  hx of DVT [x]  swelling in legs  Pulmonary: []  productive cough []  asthma/wheezing []  home O2  Neurologic: []  weakness in []  arms []  legs []  numbness in []  arms []  legs []  hx of CVA []  mini stroke [] difficulty speaking or slurred speech []  temporary loss of vision in one eye []  dizziness  Hematologic: []  hx of cancer []  bleeding problems []  problems with blood clotting easily  Endocrine:   []  diabetes []  thyroid disease  GI []  vomiting blood []  blood in stool  GU: []  CKD/renal failure []  HD--[]  M/W/F or []  T/T/S []  burning with urination []  blood in urine  Psychiatric: []  anxiety []  depression  Musculoskeletal: []  arthritis []  joint pain  Integumentary: []  rashes []  ulcers  Constitutional: []  fever [x]  lightheaded   Physical Examination  Vitals:   10/28/20 1638 10/28/20 1804  BP: (!) 113/50 (!) 117/57  Pulse: 83 77  Resp: 16   Temp:  98.7 F (37.1 C)  SpO2: 96% 99%   Body mass index is 26.08 kg/m.  General:  Nad, somnolent but arousable HENT: WNL, normocephalic Pulmonary: normal non-labored breathing Cardiac: cannot palpate pedal pulses likely secondary to edema Abdomen:  soft, NT/ND, no masses Extremities: Bilateral lower extremities with 2+ pitting edema, there is skin irritation with beginnings of peau d'orange appearance and redness Neurologic: A&O X 3; Appropriate Affect ;   CBC    Component Value Date/Time   WBC 8.3 10/28/2020 1138   RBC 3.42 (L) 10/28/2020 1138   HGB 9.9 (L) 10/28/2020 1138   HGB 12.6 09/10/2020 1535   HCT 32.6 (L) 10/28/2020 1138   HCT 38.8 09/10/2020 1535   PLT 227 10/28/2020 1138   PLT 245  09/10/2020 1535   MCV 95.3 10/28/2020 1138   MCV 90 09/10/2020 1535   MCH 28.9 10/28/2020 1138   MCHC 30.4 10/28/2020 1138   RDW 15.9 (H) 10/28/2020 1138   RDW 15.8 (H) 09/10/2020 1535   LYMPHSABS 1.7 10/13/2020 0444   MONOABS 1.0 10/13/2020 0444   EOSABS 0.0 10/13/2020 0444   BASOSABS 0.1 10/13/2020 0444    BMET    Component Value Date/Time   NA 139 10/28/2020 1138   NA 142 09/10/2020 1535   K 3.7 10/28/2020 1138   CL 97 (L) 10/28/2020 1138   CO2 33 (H) 10/28/2020 1138   GLUCOSE 97 10/28/2020 1138   BUN 22 10/28/2020 1138   BUN 28 (H) 09/10/2020 1535   CREATININE 1.60 (H) 10/28/2020 1138   CALCIUM 8.6 (L) 10/28/2020 1138   GFRNONAA 32 (L) 10/28/2020 1138   GFRAA 50 (L) 09/10/2020 1535    COAGS: Lab Results  Component Value  Date   INR 1.0 01/31/2020   INR 1.12 04/01/2018   INR 1.49 03/15/2018     Non-Invasive Vascular Imaging:   Bilateral extremity venous duplex performed 10/10/2020  summary:  RIGHT:  - Findings consistent with chronic deep vein thrombosis involving the  right popliteal vein.  - No cystic structure found in the popliteal fossa.    LEFT:  - There is no evidence of deep vein thrombosis in the lower extremity.    - No cystic structure found in the popliteal fossa.      ASSESSMENT/PLAN: This is a 79 y.o. female here with syncopal event and acute kidney injury possibly due to increased doses of diuretic.  She does have significant swelling of the bilateral lower extremities which does not appear to have resolved with diuresis.  She also has a filter that was placed over 2 years ago has not been retrieved.  We will plan for IVC iliac duplex given that she has had recent vein duplexes of the lower extremities demonstrating only chronic thrombus.  If the above studies are negative we could consider to CT venogram however with acute kidney injury would likely try to avoid contrast.  More than likely to see her as an outpatient with reflux  studies.  Tymber Stallings C. Donzetta Matters, MD [facility] Office: [phone] Pager: [phone]

## 2020-10-28 NOTE — ED Notes (Signed)
Call daughter Jerrye Beavers With any updates or changes at [phone]

## 2020-10-29 ENCOUNTER — Observation Stay (HOSPITAL_BASED_OUTPATIENT_CLINIC_OR_DEPARTMENT_OTHER): Payer: Medicare HMO

## 2020-10-29 DIAGNOSIS — I5032 Chronic diastolic (congestive) heart failure: Secondary | ICD-10-CM

## 2020-10-29 DIAGNOSIS — I82220 Acute embolism and thrombosis of inferior vena cava: Secondary | ICD-10-CM | POA: Diagnosis not present

## 2020-10-29 DIAGNOSIS — Z86718 Personal history of other venous thrombosis and embolism: Secondary | ICD-10-CM

## 2020-10-29 DIAGNOSIS — Z95828 Presence of other vascular implants and grafts: Secondary | ICD-10-CM | POA: Diagnosis not present

## 2020-10-29 DIAGNOSIS — N179 Acute kidney failure, unspecified: Secondary | ICD-10-CM | POA: Diagnosis not present

## 2020-10-29 LAB — BASIC METABOLIC PANEL
Anion gap: 8 (ref 5–15)
BUN: 20 mg/dL (ref 8–23)
CO2: 29 mmol/L (ref 22–32)
Calcium: 8.6 mg/dL — ABNORMAL LOW (ref 8.9–10.3)
Chloride: 102 mmol/L (ref 98–111)
Creatinine, Ser: 1.24 mg/dL — ABNORMAL HIGH (ref 0.44–1.00)
GFR, Estimated: 44 mL/min — ABNORMAL LOW (ref >60)
Glucose, Bld: 101 mg/dL — ABNORMAL HIGH (ref 70–99)
Potassium: 4 mmol/L (ref 3.5–5.1)
Sodium: 139 mmol/L (ref 135–145)

## 2020-10-29 LAB — CBC
HCT: 32.9 % — ABNORMAL LOW (ref 36.0–46.0)
Hemoglobin: 10 g/dL — ABNORMAL LOW (ref 12.0–15.0)
MCH: 29.2 pg (ref 26.0–34.0)
MCHC: 30.4 g/dL (ref 30.0–36.0)
MCV: 96.2 fL (ref 80.0–100.0)
Platelets: 182 10*3/uL (ref 150–400)
RBC: 3.42 MIL/uL — ABNORMAL LOW (ref 3.87–5.11)
RDW: 16 % — ABNORMAL HIGH (ref 11.5–15.5)
WBC: 6.5 10*3/uL (ref 4.0–10.5)
nRBC: 0 % (ref 0.0–0.2)

## 2020-10-29 LAB — BRAIN NATRIURETIC PEPTIDE: B Natriuretic Peptide: 425.5 pg/mL — ABNORMAL HIGH (ref 0.0–100.0)

## 2020-10-29 MED ORDER — CITALOPRAM HYDROBROMIDE 20 MG PO TABS
20.0000 mg | ORAL_TABLET | Freq: Every day | ORAL | Status: DC
  Administered 2020-10-29 – 2020-11-01 (×4): 20 mg via ORAL
  Filled 2020-10-29 (×4): qty 1

## 2020-10-29 MED ORDER — OXYCODONE HCL ER 10 MG PO T12A
10.0000 mg | EXTENDED_RELEASE_TABLET | Freq: Two times a day (BID) | ORAL | Status: DC
  Administered 2020-10-29 – 2020-10-31 (×5): 10 mg via ORAL
  Filled 2020-10-29 (×5): qty 1

## 2020-10-29 MED ORDER — SODIUM CHLORIDE 0.9 % IV SOLN: INTRAVENOUS | Status: DC

## 2020-10-29 NOTE — Progress Notes (Signed)
PROGRESS NOTE                                                                             PROGRESS NOTE                                                                                                                                                                                                             Patient Demographics:    [name], is a 79 y.o. female, DOB - 1941/08/02, FOY:[id number]  Outpatient Primary MD for the patient is [name], [name], MD    LOS - 0  Admit date - 10/28/2020    Chief Complaint  Patient presents with  . Hypotension       Brief Narrative     HPI: [name] is a 79 y.o. 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  She last took them yesterday Saturday, 10/27/2020.  She mentions that she has been lightheaded and weak for the past few days.  Lightheadedness is positional.  She has had some palpitations and more dyspnea than usual on exertion.  She denies nausea, vomiting or diarrhea.  However, she complains of some mild dysuria and urinary tenesmus.  She denies chest pain, diaphoresis, PND, orthopnea.  She states that her lower extremity edema is better.  ED Course: Initial vital signs were 97.4 F, pulse 81, respiration 18, blood pressure 91/72 mmHg O2  sat 100% on 4 L/min via nasal cannula.  The patient received a 500 mL bolus and 50 mcg of fentanyl.  CBC showed a white count of 9.3, hemoglobin 9.9 g/dL and platelets 225.  Troponin #1 was normal.  BNP 339.0 pg/mL.  Sodium one hundred and thirty-eight, potassium 3.2, chloride ninety-three and CO2 34 mmol/L.  Glucose seventy-six, BUN twenty-four and creatinine 2.20 mg/dL.  No active disease seen on chest radiograph.  Are you team reevaluated by which team are you today.  I will get these 1 days    Subjective:    [name] today ports good night sleep, she denies chest pain, reports shortness of breath at baseline.       Assessment  & Plan :    Principal Problem:   AKI (acute kidney injury) ([place]) Active Problems:   Hypothyroidism   Essential hypertension   COPD (chronic obstructive pulmonary disease) (HCC)   Sleep apnea   Dyslipidemia   Chronic diastolic heart failure (HCC)   GERD without esophagitis   History of pulmonary embolism   Normocytic anemia    AKI /hypotension -Most likely due to overdiuresis, volume depletion and hypotension, creatinine elevated 2.2 on admission, it is improving with IV fluids, this morning is 4, baseline is less than 1, continue with IV fluids at 50 cc/h (avoid higher rate given her history of diastolic CHF) in anticipation for normal renal function so we can proceed with venogram, please see discussion below .  Subacute bilateral lower extremity edema/suspicion of IVC filter occlusion -Edema has worsened recently, where her Lasix has increased by her PCP 80 mg daily and 40 mg at bedtime, she is not with evidence of acute diastolic CHF. -Lower extremity edema most likely due to suspicion for partially occluded IVC filter, discussed with vascular surgery who reviewed ultrasound of iliac/inferior vena cava, measuring highly suspicious for occluded IVC filter, will need CT venogram once renal function has normalized to see if it is occluded then likely  will need to be retrieved.    Hypokalemia -Repleted    Hypothyroidism Continue levothyroxine 50 mcg p.o. daily.    Essential hypertension Holding diuretics. Monitor blood pressure. Continue gentle IV hydration.    COPD (chronic obstructive pulmonary disease) (HCC) Continue daily Daliresp, Spiriva and Combivent.    Sleep apnea No longer on CPAP.    Dyslipidemia Continue atorvastatin 20 mg p.o. daily    Chronic diastolic heart failure (HCC) Volume depleted. Hold diuretics.    GERD without esophagitis Pantoprazole 40 mg p.o. daily.    History of pulmonary embolism and DVT. The patient has history of IVC placement.    Normocytic anemia Monitor H&H. Transfuse as needed.  SpO2: 100 % O2 Flow Rate (L/min): 4 L/min  Recent Labs  Lab 10/28/20 0108 10/28/20 0659 10/28/20 1138 10/29/20 0257  WBC 9.3  --  8.3 6.5  PLT 225  --  227 182  BNP 339.0*  --   --  425.5*  LATICACIDVEN  --  1.2  --   --   SARSCOV2NAA  --  NEGATIVE  --   --        ABG     Component Value Date/Time   PHART 7.374 03/14/2018 0127   PCO2ART 29.2 (L) 03/14/2018 0127   PO2ART 139 (H) 03/14/2018 0127   HCO3 16.7 (L) 03/14/2018 0127   TCO2 30 10/29/2011 0925   ACIDBASEDEF 7.6 (H) 03/14/2018 0127   O2SAT 98.8 03/14/2018 0127        Condition - Extremely Guarded  Family Communication  :  Daughter  in law  at bedside  Code Status :  Full  Consults  :  none  Procedures  :  none  Disposition Plan  :    Status [id number]  The patient will require care spanning > 2 midnights and should be moved to inpatient because: Ongoing diagnostic testing needed not appropriate for outpatient work up and IV treatments appropriate due to intensity of illness or inability to take PO  Dispo: The patient is from: Home              Anticipated d/c is to: Home              Anticipated d/c date is: 1 day              Patient currently is not medically stable to d/c.  Patient is not  medically for discharge yet, AKI is improving, but high suspicion for occlusive IVC for which she will require venogram once her function has improved, will continue with gentle hydration hopefully more studies and possible IVC filter retrieval in 48 hours once renal function back to baseline.      DVT Prophylaxis  :   Heparin   Lab Results  Component Value Date   PLT 182 10/29/2020    Diet :  Diet Order            Diet Heart Room service appropriate? Yes; Fluid consistency: Thin  Diet effective now                  Inpatient Medications  Scheduled Meds: . allopurinol  100 mg Oral Daily  . ARIPiprazole  1 mg Oral Daily  . aspirin  81 mg Oral Daily  . atorvastatin  20 mg Oral Daily  . citalopram  40 mg Oral QHS  . ferrous sulfate  325 mg Oral Daily  . gabapentin  100 mg Oral Daily  . heparin injection (subcutaneous)  5,000 Units Subcutaneous Q8H  . levothyroxine  50 mcg Oral Q0600  . magnesium oxide  400 mg Oral BID  . melatonin  10 mg Oral QHS  . ofloxacin  5 drop Left EAR Daily  . oxyCODONE  10 mg Oral Q12H  . pantoprazole  40 mg Oral Daily  . pilocarpine  1 drop Both Eyes BID  . polyethylene glycol  17 g Oral Daily  . potassium chloride SA  20 mEq Oral Daily  . pramipexole  0.25 mg Oral QPM  . roflumilast  500 mcg Oral QPM  . traZODone  150 mg Oral QHS  . umeclidinium bromide  1 puff Inhalation Daily   Continuous Infusions: . sodium chloride     PRN Meds:.acetaminophen **OR** acetaminophen, Ipratropium-Albuterol  Antibiotics  :    Anti-infectives (From admission, onward)   None      Phillips Climes M.D on 10/29/2020 at 3:20 PM  To page go to [email or web address]   [facility] -  Office  [phone]        Objective:   Vitals:   10/28/20 2114 10/29/20 0131 10/29/20 0507 10/29/20 0957  BP: 124/79 (!) 119/54 (!) 112/57 113/61  Pulse: 86 80 87 78  Resp: 16 16 17 18   Temp: 98.2 F (36.8 C) 98 F (36.7 C) 98.4 F (36.9 C) 98.9 F (37.2 C)   TempSrc: Oral Oral Oral Oral  SpO2: 97% 100% 98% 100%  Weight: 81 kg     Height:        Wt Readings from Last 3 Encounters:  10/28/20  81 kg  10/13/20 77.8 kg  09/10/20 75.3 kg     Intake/Output Summary (Last 24 hours) at 10/29/2020 1520 Last data filed at 10/29/2020 1000 Gross per 24 hour  Intake 788.75 ml  Output 0 ml  Net 788.75 ml     Physical Exam  Awake Alert, No new F.N deficits, Normal affect, frail, chronically ill-appearing Symmetrical Chest wall movement, Good air movement bilaterally, no wheezing or crackles at the bases RRR,No Gallops,Rubs or new Murmurs, No Parasternal Heave +ve B.Sounds, Abd Soft, No tenderness, No organomegaly appriciated, No rebound - guarding or rigidity. No Cyanosis, Clubbing, with +3 lower extremity edema with erythema.    Data Review:    CBC Recent Labs  Lab 10/28/20 0108 10/28/20 1138 10/29/20 0257  WBC 9.3 8.3 6.5  HGB 9.8* 9.9* 10.0*  HCT 31.9* 32.6* 32.9*  PLT 225 227 182  MCV 94.9 95.3 96.2  MCH 29.2 28.9 29.2  MCHC 30.7 30.4 30.4  RDW 15.9* 15.9* 16.0*    Recent Labs  Lab 10/28/20 0108 10/28/20 0659 10/28/20 1138 10/29/20 0257  NA 138  --  139 139  K 3.2*  --  3.7 4.0  CL 93*  --  97* 102  CO2 34*  --  33* 29  GLUCOSE 76  --  97 101*  BUN 24*  --  22 20  CREATININE 2.20*  --  1.60* 1.24*  CALCIUM 8.7*  --  8.6* 8.6*  LATICACIDVEN  --  1.2  --   --   BNP 339.0*  --   --  425.5*    ------------------------------------------------------------------------------------------------------------------ No results for input(s): CHOL, HDL, LDLCALC, TRIG, CHOLHDL, LDLDIRECT in the last 72 hours.  No results found for: HGBA1C ------------------------------------------------------------------------------------------------------------------ No results for input(s): TSH, T4TOTAL, T3FREE, THYROIDAB in the last 72 hours.  Invalid input(s): FREET3  Cardiac Enzymes No results for input(s): CKMB, TROPONINI, MYOGLOBIN  in the last 168 hours.  Invalid input(s): CK ------------------------------------------------------------------------------------------------------------------    Component Value Date/Time   BNP 425.5 (H) 10/29/2020 0257    Micro Results Recent Results (from the past 240 hour(s))  Respiratory Panel by RT PCR (Flu A&B, Covid) - Nasopharyngeal Swab     Status: None   Collection Time: 10/28/20  6:59 AM   Specimen: Nasopharyngeal Swab  Result Value Ref Range Status   SARS Coronavirus 2 by RT PCR NEGATIVE NEGATIVE Final    Comment: (NOTE) SARS-CoV-2 target nucleic acids are NOT DETECTED.  The SARS-CoV-2 RNA is generally detectable in upper respiratoy specimens during the acute phase of infection. The lowest concentration of SARS-CoV-2 viral copies this assay can detect is 131 copies/mL. A negative result does not preclude SARS-Cov-2 infection and should not be used as the sole basis for treatment or other patient management decisions. A negative result may occur with  improper specimen collection/handling, submission of specimen other than nasopharyngeal swab, presence of viral mutation(s) within the areas targeted by this assay, and inadequate number of viral copies (<131 copies/mL). A negative result must be combined with clinical observations, patient history, and epidemiological information. The expected result is Negative.  Fact Sheet for Patients:  PinkCheek.be  Fact Sheet for Healthcare Providers:  GravelBags.it  This test is no t yet approved or cleared by the [place] FDA and  has been authorized for detection and/or diagnosis of SARS-CoV-2 by FDA under an Emergency Use Authorization (EUA). This EUA will remain  in effect (meaning this test can be used) for the duration of the COVID-19 declaration under Section  564(b)(1) of the Act, 21 U.S.C. section 360bbb-3(b)(1), unless the authorization is terminated  or revoked sooner.     Influenza A by PCR NEGATIVE NEGATIVE Final   Influenza B by PCR NEGATIVE NEGATIVE Final    Comment: (NOTE) The Xpert Xpress SARS-CoV-2/FLU/RSV assay is intended as an aid in  the diagnosis of influenza from Nasopharyngeal swab specimens and  should not be used as a sole basis for treatment. Nasal washings and  aspirates are unacceptable for Xpert Xpress SARS-CoV-2/FLU/RSV  testing.  Fact Sheet for Patients: PinkCheek.be  Fact Sheet for Healthcare Providers: GravelBags.it  This test is not yet approved or cleared by the [place] FDA and  has been authorized for detection and/or diagnosis of SARS-CoV-2 by  FDA under an Emergency Use Authorization (EUA). This EUA will remain  in effect (meaning this test can be used) for the duration of the  Covid-19 declaration under Section 564(b)(1) of the Act, 21  U.S.C. section 360bbb-3(b)(1), unless the authorization is  terminated or revoked. Performed at [facility], [place] [address], [place], [place] [address]     Radiology Reports NM Pulmonary Perfusion  Result Date: 10/11/2020 CLINICAL DATA:  Shortness of breath and chest pain EXAM: NUCLEAR MEDICINE PERFUSION LUNG SCAN TECHNIQUE: Perfusion images were obtained in multiple projections after intravenous injection of radiopharmaceutical. Views: Anterior, posterior, RPO, LPO, RAO, LAO RADIOPHARMACEUTICALS:  4.2 mCi Tc-13m MAA IV COMPARISON:  Chest radiograph October 10, 2020 FINDINGS: Distribution of radiotracer uptake is homogeneous and symmetric bilaterally. No appreciable perfusion defects. IMPRESSION: No appreciable perfusion defects. No findings indicative of pulmonary embolus. Electronically Signed   By: Lowella Grip III M.D.   On: 10/11/2020 11:07   VAS [place] IVC/ILIAC (VENOUS ONLY)  Result Date: 10/29/2020 IVC/ILIAC STUDY Indications: Chronic Leg DVT Risk Factors: Hypertension.  Comparison  Study: None Performing Technologist: Griffin Basil RCT RDMS  Examination Guidelines: A complete evaluation includes B-mode imaging, spectral Doppler, color Doppler, and power Doppler as needed of all accessible portions of each vessel. Bilateral testing is considered an integral part of a complete examination. Limited examinations for reoccurring indications may be performed as noted.  IVC/Iliac Findings: +----------+------+--------+--------+    IVC    PatentThrombusComments +----------+------+--------+--------+ IVC Prox  patent                 +----------+------+--------+--------+ IVC Mid         chronic          +----------+------+--------+--------+ IVC Distalpatent                 +----------+------+--------+--------+  +-------------------+---------+-----------+---------+-----------+--------+         CIV        RT-PatentRT-ThrombusLT-PatentLT-ThrombusComments +-------------------+---------+-----------+---------+-----------+--------+ Common Iliac Prox   patent              patent                      +-------------------+---------+-----------+---------+-----------+--------+ Common Iliac Mid    patent              patent                      +-------------------+---------+-----------+---------+-----------+--------+ Common Iliac Distal patent              patent                      +-------------------+---------+-----------+---------+-----------+--------+    Summary: IVC/Iliac: Mid IVC Thrombus , Hyperechoic non occlusive.  *See table(s)  above for measurements and observations.   Preliminary    DG Chest Portable 1 View  Result Date: 10/28/2020 CLINICAL DATA:  Shortness of breath EXAM: PORTABLE CHEST 1 VIEW COMPARISON:  10/10/2020 FINDINGS: The heart size and mediastinal contours are within normal limits. Both lungs are clear. The visualized skeletal structures are unremarkable. The patient is status post prior median sternotomy. Aortic calcifications are noted.  There are calcified bilateral breast implants. There is pleuroparenchymal scarring at the lung apices. There are end-stage degenerative changes of the left glenohumeral joint. The patient is status post prior total shoulder arthroplasty on the right. IMPRESSION: No active disease. Electronically Signed   By: Constance Holster M.D.   On: 10/28/2020 01:49   DG CHEST PORT 1 VIEW  Result Date: 10/10/2020 CLINICAL DATA:  Dyspnea. EXAM: PORTABLE CHEST 1 VIEW COMPARISON:  October 09, 2020. FINDINGS: Stable cardiomediastinal silhouette. Status post coronary bypass graft. No pneumothorax or pleural effusion is noted. No acute pulmonary disease is noted. Status post right shoulder arthroplasty. IMPRESSION: No active disease. Aortic Atherosclerosis (ICD10-I70.0). Electronically Signed   By: Marijo Conception M.D.   On: 10/10/2020 10:13   DG Chest Port 1 View  Result Date: 10/09/2020 CLINICAL DATA:  Shortness of breath, cough and fever. EXAM: PORTABLE CHEST 1 VIEW COMPARISON:  10/05/2019 FINDINGS: Previous median sternotomy and CABG. Mild cardiomegaly. Aortic atherosclerosis. Bilateral breast implants with density overlying the chest. Suspicion of bronchitis and hazy pneumonia in the mid and lower lungs. No dense consolidation, lobar collapse or effusion. Old shoulder replacement on the right. Chronic degenerative changes of the left shoulder. IMPRESSION: 1. Suspicion of bronchitis and hazy pneumonia in the mid and lower lungs. No dense consolidation or lobar collapse. 2. Previous CABG. Mild cardiomegaly. Aortic atherosclerosis. Electronically Signed   By: Nelson Chimes M.D.   On: 10/09/2020 13:15   ECHOCARDIOGRAM COMPLETE  Result Date: 10/09/2020    ECHOCARDIOGRAM REPORT   Patient Name:   JAKYLA REZA Date of Exam: 10/09/2020 Medical Rec #:  [id number]      Height:       68.0 in Accession #:    [id number]     Weight:       167.5 lb Date of Birth:  Sep 25, 1941       BSA:          1.896 m Patient Age:    67 years        BP:           92/42 mmHg Patient Gender: F              HR:           88 bpm. Exam Location:  Inpatient Procedure: Cardiac Doppler, Color Doppler, 2D Echo and 3D Echo STAT ECHO Indications:    R07.9* Chest pain, unspecified  History:        Patient has prior history of Echocardiogram examinations, most                 recent 10/05/2019. CAD, Abnormal ECG and Prior CABG, COPD,                 Signs/Symptoms:Dyspnea; Risk Factors:Sleep Apnea and                 Dyslipidemia. Hypoxia. Pulmonary embolus.  Sonographer:    Roseanna Rainbow RDCS Referring Phys: [id number] [place]  1. Diastology suggestive of impaired LV filling. Left ventricular ejection fraction, by estimation, is 60 to 65%. The left  ventricle has normal function. The left ventricle has no regional wall motion abnormalities. There is moderate concentric left ventricular hypertrophy. Left ventricular diastolic parameters are indeterminate.  2. Right ventricular systolic function is low normal. The right ventricular size is normal. There is normal pulmonary artery systolic pressure.  3. Left atrial size was moderately dilated.  4. Right atrial size was moderately dilated.  5. The mitral valve is myxomatous. No evidence of mitral valve regurgitation. The mean mitral valve gradient is 5.0 mmHg with average heart rate of 87 bpm. Moderate mitral annular calcification.  6. The aortic valve is tricuspid. Aortic valve regurgitation is not visualized.  7. The inferior vena cava is dilated in size with <50% respiratory variability, suggesting right atrial pressure of 15 mmHg. Comparison(s): A prior study was performed on 10/05/2019. No significant change from prior study. Subtle decrease in RV function. FINDINGS  Left Ventricle: Diastology suggestive of impaired LV filling. Left ventricular ejection fraction, by estimation, is 60 to 65%. The left ventricle has normal function. The left ventricle has no regional wall motion abnormalities. The left  ventricular internal cavity size was normal in size. There is moderate concentric left ventricular hypertrophy. Left ventricular diastolic parameters are indeterminate. Right Ventricle: The right ventricular size is normal. Right vetricular wall thickness was not well visualized. Right ventricular systolic function is low normal. There is normal pulmonary artery systolic pressure. The tricuspid regurgitant velocity is 2.30 m/s, and with an assumed right atrial pressure of 3 mmHg, the estimated right ventricular systolic pressure is 63.8 mmHg. Left Atrium: Left atrial size was moderately dilated. Right Atrium: Right atrial size was moderately dilated. Pericardium: There is no evidence of pericardial effusion. Mitral Valve: The mitral valve is myxomatous. There is mild calcification of the mitral valve leaflet(s). Moderate mitral annular calcification. No evidence of mitral valve regurgitation. MV peak gradient, 25.4 mmHg. The mean mitral valve gradient is 5.0  mmHg with average heart rate of 87 bpm. Tricuspid Valve: The tricuspid valve is grossly normal. Tricuspid valve regurgitation is trivial. Aortic Valve: The aortic valve is tricuspid. Aortic valve regurgitation is not visualized. Pulmonic Valve: The pulmonic valve was not well visualized. Pulmonic valve regurgitation is trivial. Aorta: The aortic root and ascending aorta are structurally normal, with no evidence of dilitation. Venous: The inferior vena cava is dilated in size with less than 50% respiratory variability, suggesting right atrial pressure of 15 mmHg. IAS/Shunts: The interatrial septum appears to be lipomatous. The atrial septum is grossly normal.  LEFT VENTRICLE PLAX 2D LVIDd:         4.60 cm     Diastology LVIDs:         3.00 cm     LV e' medial:    5.87 cm/s LV PW:         1.40 cm     LV E/e' medial:  27.1 LV IVS:        1.40 cm     LV e' lateral:   8.21 cm/s LVOT diam:     1.70 cm     LV E/e' lateral: 19.4 LV SV:         62 LV SV Index:   33 LVOT  Area:     2.27 cm  LV Volumes (MOD) LV vol d, MOD A2C: 99.9 ml LV vol d, MOD A4C: 71.4 ml LV vol s, MOD A2C: 35.9 ml LV vol s, MOD A4C: 26.8 ml LV SV MOD A2C:     64.0 ml LV SV MOD  A4C:     71.4 ml LV SV MOD BP:      56.6 ml RIGHT VENTRICLE            IVC RV S prime:     8.08 cm/s  IVC diam: 3.00 cm TAPSE (M-mode): 1.4 cm LEFT ATRIUM             Index       RIGHT ATRIUM           Index LA diam:        3.80 cm 2.00 cm/m  RA Area:     17.30 cm LA Vol (A2C):   56.3 ml 29.70 ml/m RA Volume:   44.90 ml  23.69 ml/m LA Vol (A4C):   44.9 ml 23.69 ml/m LA Biplane Vol: 55.6 ml 29.33 ml/m  AORTIC VALVE LVOT Vmax:   129.00 cm/s LVOT Vmean:  86.900 cm/s LVOT VTI:    0.272 m  AORTA Ao Root diam: 3.40 cm Ao Asc diam:  3.40 cm MITRAL VALVE                TRICUSPID VALVE MV Area (PHT): 4.15 cm     TR Peak grad:   21.2 mmHg MV Peak grad:  25.4 mmHg    TR Vmax:        230.00 cm/s MV Mean grad:  5.0 mmHg MV Vmax:       2.52 m/s     SHUNTS MV Vmean:      88.8 cm/s    Systemic VTI:  0.27 m MV Decel Time: 183 msec     Systemic Diam: 1.70 cm MV E velocity: 159.00 cm/s MV A velocity: 209.00 cm/s MV E/A ratio:  0.76 Rudean Haskell MD Electronically signed by Rudean Haskell MD Signature Date/Time: 10/09/2020/4:38:55 PM    Final    VAS [place] LOWER EXTREMITY VENOUS (DVT)  Result Date: 10/10/2020  Lower Venous DVTStudy Indications: Elevated ddimer.  Comparison Study: previous 11/03/18 Performing Technologist: Abram Sander RVS  Examination Guidelines: A complete evaluation includes B-mode imaging, spectral Doppler, color Doppler, and power Doppler as needed of all accessible portions of each vessel. Bilateral testing is considered an integral part of a complete examination. Limited examinations for reoccurring indications may be performed as noted. The reflux portion of the exam is performed with the patient in reverse Trendelenburg.  +---------+---------------+---------+-----------+----------+--------------+ RIGHT     CompressibilityPhasicitySpontaneityPropertiesThrombus Aging +---------+---------------+---------+-----------+----------+--------------+ CFV      Full           Yes      Yes                                 +---------+---------------+---------+-----------+----------+--------------+ SFJ      Full                                                        +---------+---------------+---------+-----------+----------+--------------+ FV Prox  Full                                                        +---------+---------------+---------+-----------+----------+--------------+ FV Mid   Full                                                        +---------+---------------+---------+-----------+----------+--------------+  FV DistalFull                                                        +---------+---------------+---------+-----------+----------+--------------+ PFV      Full                                                        +---------+---------------+---------+-----------+----------+--------------+ POP      None           Yes      Yes                  Chronic        +---------+---------------+---------+-----------+----------+--------------+ PTV      Full                                                        +---------+---------------+---------+-----------+----------+--------------+ PERO     Full                                                        +---------+---------------+---------+-----------+----------+--------------+   +---------+---------------+---------+-----------+----------+--------------+ LEFT     CompressibilityPhasicitySpontaneityPropertiesThrombus Aging +---------+---------------+---------+-----------+----------+--------------+ CFV      Full           Yes      Yes                                 +---------+---------------+---------+-----------+----------+--------------+ SFJ      Full                                                         +---------+---------------+---------+-----------+----------+--------------+ FV Prox  Full                                                        +---------+---------------+---------+-----------+----------+--------------+ FV Mid   Full                                                        +---------+---------------+---------+-----------+----------+--------------+ FV DistalFull                                                        +---------+---------------+---------+-----------+----------+--------------+  PFV      Full                                                        +---------+---------------+---------+-----------+----------+--------------+ POP      Full           Yes      Yes                                 +---------+---------------+---------+-----------+----------+--------------+ PTV      Full                                                        +---------+---------------+---------+-----------+----------+--------------+ PERO     Full                                                        +---------+---------------+---------+-----------+----------+--------------+     Summary: RIGHT: - Findings consistent with chronic deep vein thrombosis involving the right popliteal vein. - No cystic structure found in the popliteal fossa.  LEFT: - There is no evidence of deep vein thrombosis in the lower extremity.  - No cystic structure found in the popliteal fossa.  *See table(s) above for measurements and observations. Electronically signed by Harold Barban MD on 10/10/2020 at 7:40:41 PM.    Final    VAS [place] UPPER EXTREMITY VENOUS DUPLEX  Result Date: 10/10/2020 UPPER VENOUS STUDY  Indications: elevated ddimer Comparison Study: no prior Performing Technologist: Abram Sander RVS  Examination Guidelines: A complete evaluation includes B-mode imaging, spectral Doppler, color Doppler, and power Doppler as needed of all accessible portions of each  vessel. Bilateral testing is considered an integral part of a complete examination. Limited examinations for reoccurring indications may be performed as noted.  Right Findings: +----------+------------+---------+-----------+----------+-------+ RIGHT     CompressiblePhasicitySpontaneousPropertiesSummary +----------+------------+---------+-----------+----------+-------+ IJV           Full       Yes       Yes                      +----------+------------+---------+-----------+----------+-------+ Subclavian    Full       Yes       Yes                      +----------+------------+---------+-----------+----------+-------+ Axillary      Full       Yes       Yes                      +----------+------------+---------+-----------+----------+-------+ Brachial      Full       Yes       Yes                      +----------+------------+---------+-----------+----------+-------+ Radial        Full                                          +----------+------------+---------+-----------+----------+-------+  Ulnar         Full                                          +----------+------------+---------+-----------+----------+-------+ Cephalic      Full                                          +----------+------------+---------+-----------+----------+-------+ Basilic       Full                                          +----------+------------+---------+-----------+----------+-------+  Left Findings: +----------+------------+---------+-----------+----------+-------+ LEFT      CompressiblePhasicitySpontaneousPropertiesSummary +----------+------------+---------+-----------+----------+-------+ IJV           Full       Yes       Yes                      +----------+------------+---------+-----------+----------+-------+ Subclavian    Full       Yes       Yes                      +----------+------------+---------+-----------+----------+-------+ Axillary       Full       Yes       Yes                      +----------+------------+---------+-----------+----------+-------+ Brachial      Full       Yes       Yes                      +----------+------------+---------+-----------+----------+-------+ Radial        Full                                          +----------+------------+---------+-----------+----------+-------+ Ulnar         Full                                          +----------+------------+---------+-----------+----------+-------+ Cephalic      Full                                          +----------+------------+---------+-----------+----------+-------+ Basilic       Full                                          +----------+------------+---------+-----------+----------+-------+  Summary: No evidence of deep vein or superficial vein thrombosis involving the right and left upper extremities.  *See table(s) above for measurements and observations.  Diagnosing physician: Harold Barban MD Electronically signed by Harold Barban MD on 10/10/2020 at 7:40:28 PM.    Final

## 2020-10-29 NOTE — Progress Notes (Signed)
IVC and Iliac study completed.   See CVProc for preliminary results.   Griffin Basil, RDMS, RVT

## 2020-10-29 NOTE — Progress Notes (Signed)
PT Cancellation Note  Patient Details Name: [name] MRN: [id number] DOB: Feb 14, 1941   Cancelled Treatment:    Reason Eval/Treat Not Completed: Patient at procedure or test/unavailable Will follow up as schedule allows.   Reuel Derby, PT, DPT  Acute Rehabilitation Services  Pager: [phone] Office: [phone]    Rudean Hitt 10/29/2020, 11:18 AM

## 2020-10-29 NOTE — Progress Notes (Signed)
Pt is NPO for VAS [place] IVC/Iliac. Pt has scheduled oxycontin this am. RN messaged MD on call to see if pt can have sips with meds or not. Awaiting response.   Eleanora Neighbor, RN

## 2020-10-29 NOTE — Plan of Care (Signed)
  Problem: Education: Goal: Knowledge of General Education information will improve Description: Including pain rating scale, medication(s)/side effects and non-pharmacologic comfort measures Outcome: Completed/Met

## 2020-10-29 NOTE — Plan of Care (Signed)
°  Problem: Coping: °Goal: Level of anxiety will decrease °Outcome: Progressing °  °

## 2020-10-29 NOTE — Progress Notes (Addendum)
Progress Note    10/29/2020 8:36 AM * No surgery found *  Subjective:  Complaining of headache, backache and bilateral lower extremity pain. She feels that her edema is a little better.   Vitals:   10/29/20 0131 10/29/20 0507  BP: (!) 119/54 (!) 112/57  Pulse: 80 87  Resp: 16 17  Temp: 98 F (36.7 C) 98.4 F (36.9 C)  SpO2: 100% 98%    Physical Exam: Cardiac:  RRR Lungs:  Non-labored Extremities:  1+ edema and erythema bilaterally. +hemosiderin staining. Intact motor function and sensation. Calves are soft Pulses: Right: 2+ AT. 1+ PT    Left: 1+ AT, 1+ PT       CBC    Component Value Date/Time   WBC 6.5 10/29/2020 0257   RBC 3.42 (L) 10/29/2020 0257   HGB 10.0 (L) 10/29/2020 0257   HGB 12.6 09/10/2020 1535   HCT 32.9 (L) 10/29/2020 0257   HCT 38.8 09/10/2020 1535   PLT 182 10/29/2020 0257   PLT 245 09/10/2020 1535   MCV 96.2 10/29/2020 0257   MCV 90 09/10/2020 1535   MCH 29.2 10/29/2020 0257   MCHC 30.4 10/29/2020 0257   RDW 16.0 (H) 10/29/2020 0257   RDW 15.8 (H) 09/10/2020 1535   LYMPHSABS 1.7 10/13/2020 0444   MONOABS 1.0 10/13/2020 0444   EOSABS 0.0 10/13/2020 0444   BASOSABS 0.1 10/13/2020 0444    BMET    Component Value Date/Time   NA 139 10/29/2020 0257   NA 142 09/10/2020 1535   K 4.0 10/29/2020 0257   CL 102 10/29/2020 0257   CO2 29 10/29/2020 0257   GLUCOSE 101 (H) 10/29/2020 0257   BUN 20 10/29/2020 0257   BUN 28 (H) 09/10/2020 1535   CREATININE 1.24 (H) 10/29/2020 0257   CALCIUM 8.6 (L) 10/29/2020 0257   GFRNONAA 44 (L) 10/29/2020 0257   GFRAA 50 (L) 09/10/2020 1535     Intake/Output Summary (Last 24 hours) at 10/29/2020 0836 Last data filed at 10/29/2020 0601 Gross per 24 hour  Intake 2288.75 ml  Output 0 ml  Net 2288.75 ml    HOSPITAL MEDICATIONS Scheduled Meds: . allopurinol  100 mg Oral Daily  . ARIPiprazole  1 mg Oral Daily  . aspirin  81 mg Oral Daily  . atorvastatin  20 mg Oral Daily  . citalopram  40 mg Oral  QHS  . ferrous sulfate  325 mg Oral Daily  . gabapentin  100 mg Oral Daily  . heparin injection (subcutaneous)  5,000 Units Subcutaneous Q8H  . levothyroxine  50 mcg Oral Q0600  . magnesium oxide  400 mg Oral BID  . melatonin  10 mg Oral QHS  . ofloxacin  5 drop Left EAR Daily  . oxyCODONE  10 mg Oral Q12H  . pantoprazole  40 mg Oral Daily  . pilocarpine  1 drop Both Eyes BID  . polyethylene glycol  17 g Oral Daily  . potassium chloride SA  20 mEq Oral Daily  . pramipexole  0.25 mg Oral QPM  . roflumilast  500 mcg Oral QPM  . traZODone  150 mg Oral QHS  . umeclidinium bromide  1 puff Inhalation Daily   Continuous Infusions: . sodium chloride 50 mL/hr at 10/29/20 0255   PRN Meds:.acetaminophen **OR** acetaminophen, Ipratropium-Albuterol  Assessment: 79 yo with history of LLE DVT s/p IVC filter placement presents with LE edema, erythema and sonographic evidence of chromic thrombus of right popliteal vein. Afebrile. Normal WBC. No evidence of phlegmasia.  AKI: SCr improved  Plan: -Follow-up IVC/iliac vein duplex. Possible CT venogram. Follow serum creatinine. -DVT prophylaxis:  Heparin [place]   Risa Grill, PA-C [facility] [phone] 10/29/2020  8:36 AM   IVC/Iliac Findings:  +----------+------+--------+--------+    IVC  PatentThrombusComments  +----------+------+--------+--------+  IVC Prox patent          +----------+------+--------+--------+  IVC Mid     chronic       +----------+------+--------+--------+  IVC Distalpatent          +----------+------+--------+--------+ .  I have seen and evaluated the patient.  Legs remain edematous.  I reviewed her IVC iliac duplex.  We will plan for CT venogram when creatinine normalizes.  Patient will refuse any procedure that is not under general anesthesia I have discussed proceeding with possible filter retrieval in the OR later this week.  Lindsea Olivar C. Donzetta Matters,  MD [facility] [place] Office: [phone] Pager: [phone]

## 2020-10-30 ENCOUNTER — Observation Stay (HOSPITAL_COMMUNITY): Payer: Medicare HMO

## 2020-10-30 ENCOUNTER — Other Ambulatory Visit: Payer: Self-pay

## 2020-10-30 DIAGNOSIS — K469 Unspecified abdominal hernia without obstruction or gangrene: Secondary | ICD-10-CM | POA: Diagnosis not present

## 2020-10-30 DIAGNOSIS — I739 Peripheral vascular disease, unspecified: Secondary | ICD-10-CM | POA: Diagnosis present

## 2020-10-30 DIAGNOSIS — Y831 Surgical operation with implant of artificial internal device as the cause of abnormal reaction of the patient, or of later complication, without mention of misadventure at the time of the procedure: Secondary | ICD-10-CM | POA: Diagnosis present

## 2020-10-30 DIAGNOSIS — J449 Chronic obstructive pulmonary disease, unspecified: Secondary | ICD-10-CM | POA: Diagnosis present

## 2020-10-30 DIAGNOSIS — I110 Hypertensive heart disease with heart failure: Secondary | ICD-10-CM | POA: Diagnosis present

## 2020-10-30 DIAGNOSIS — N179 Acute kidney failure, unspecified: Secondary | ICD-10-CM | POA: Diagnosis present

## 2020-10-30 DIAGNOSIS — I878 Other specified disorders of veins: Secondary | ICD-10-CM | POA: Diagnosis present

## 2020-10-30 DIAGNOSIS — K219 Gastro-esophageal reflux disease without esophagitis: Secondary | ICD-10-CM | POA: Diagnosis present

## 2020-10-30 DIAGNOSIS — I5033 Acute on chronic diastolic (congestive) heart failure: Secondary | ICD-10-CM | POA: Diagnosis present

## 2020-10-30 DIAGNOSIS — J9611 Chronic respiratory failure with hypoxia: Secondary | ICD-10-CM | POA: Diagnosis present

## 2020-10-30 DIAGNOSIS — K8689 Other specified diseases of pancreas: Secondary | ICD-10-CM | POA: Diagnosis not present

## 2020-10-30 DIAGNOSIS — Z86711 Personal history of pulmonary embolism: Secondary | ICD-10-CM | POA: Diagnosis not present

## 2020-10-30 DIAGNOSIS — I5032 Chronic diastolic (congestive) heart failure: Secondary | ICD-10-CM | POA: Diagnosis not present

## 2020-10-30 DIAGNOSIS — I951 Orthostatic hypotension: Secondary | ICD-10-CM | POA: Diagnosis present

## 2020-10-30 DIAGNOSIS — K439 Ventral hernia without obstruction or gangrene: Secondary | ICD-10-CM | POA: Diagnosis not present

## 2020-10-30 DIAGNOSIS — E039 Hypothyroidism, unspecified: Secondary | ICD-10-CM | POA: Diagnosis present

## 2020-10-30 DIAGNOSIS — E785 Hyperlipidemia, unspecified: Secondary | ICD-10-CM | POA: Diagnosis present

## 2020-10-30 DIAGNOSIS — Y929 Unspecified place or not applicable: Secondary | ICD-10-CM | POA: Diagnosis not present

## 2020-10-30 DIAGNOSIS — R600 Localized edema: Secondary | ICD-10-CM | POA: Diagnosis not present

## 2020-10-30 DIAGNOSIS — E876 Hypokalemia: Secondary | ICD-10-CM | POA: Diagnosis present

## 2020-10-30 DIAGNOSIS — E860 Dehydration: Secondary | ICD-10-CM | POA: Diagnosis present

## 2020-10-30 DIAGNOSIS — Z8701 Personal history of pneumonia (recurrent): Secondary | ICD-10-CM | POA: Diagnosis not present

## 2020-10-30 DIAGNOSIS — D649 Anemia, unspecified: Secondary | ICD-10-CM | POA: Diagnosis present

## 2020-10-30 DIAGNOSIS — Z86718 Personal history of other venous thrombosis and embolism: Secondary | ICD-10-CM | POA: Diagnosis not present

## 2020-10-30 DIAGNOSIS — Z880 Allergy status to penicillin: Secondary | ICD-10-CM | POA: Diagnosis not present

## 2020-10-30 DIAGNOSIS — G4733 Obstructive sleep apnea (adult) (pediatric): Secondary | ICD-10-CM | POA: Diagnosis present

## 2020-10-30 DIAGNOSIS — I25810 Atherosclerosis of coronary artery bypass graft(s) without angina pectoris: Secondary | ICD-10-CM | POA: Diagnosis present

## 2020-10-30 DIAGNOSIS — R601 Generalized edema: Secondary | ICD-10-CM | POA: Diagnosis not present

## 2020-10-30 DIAGNOSIS — Z20822 Contact with and (suspected) exposure to covid-19: Secondary | ICD-10-CM | POA: Diagnosis present

## 2020-10-30 DIAGNOSIS — T82868A Thrombosis of vascular prosthetic devices, implants and grafts, initial encounter: Secondary | ICD-10-CM | POA: Diagnosis present

## 2020-10-30 DIAGNOSIS — Z87891 Personal history of nicotine dependence: Secondary | ICD-10-CM | POA: Diagnosis not present

## 2020-10-30 DIAGNOSIS — N281 Cyst of kidney, acquired: Secondary | ICD-10-CM | POA: Diagnosis not present

## 2020-10-30 DIAGNOSIS — Z888 Allergy status to other drugs, medicaments and biological substances status: Secondary | ICD-10-CM | POA: Diagnosis not present

## 2020-10-30 LAB — BASIC METABOLIC PANEL
Anion gap: 7 (ref 5–15)
BUN: 12 mg/dL (ref 8–23)
CO2: 32 mmol/L (ref 22–32)
Calcium: 8.9 mg/dL (ref 8.9–10.3)
Chloride: 102 mmol/L (ref 98–111)
Creatinine, Ser: 0.81 mg/dL (ref 0.44–1.00)
GFR, Estimated: >60 mL/min (ref >60)
Glucose, Bld: 98 mg/dL (ref 70–99)
Potassium: 3.8 mmol/L (ref 3.5–5.1)
Sodium: 141 mmol/L (ref 135–145)

## 2020-10-30 LAB — CBC
HCT: 32.4 % — ABNORMAL LOW (ref 36.0–46.0)
Hemoglobin: 9.8 g/dL — ABNORMAL LOW (ref 12.0–15.0)
MCH: 29.2 pg (ref 26.0–34.0)
MCHC: 30.2 g/dL (ref 30.0–36.0)
MCV: 96.4 fL (ref 80.0–100.0)
Platelets: 179 10*3/uL (ref 150–400)
RBC: 3.36 MIL/uL — ABNORMAL LOW (ref 3.87–5.11)
RDW: 15.5 % (ref 11.5–15.5)
WBC: 6 10*3/uL (ref 4.0–10.5)
nRBC: 0 % (ref 0.0–0.2)

## 2020-10-30 MED ORDER — METHYLPREDNISOLONE SODIUM SUCC 125 MG IJ SOLR
80.0000 mg | Freq: Once | INTRAMUSCULAR | Status: AC
  Administered 2020-10-30: 80 mg via INTRAVENOUS
  Filled 2020-10-30: qty 2

## 2020-10-30 MED ORDER — IPRATROPIUM-ALBUTEROL 0.5-2.5 (3) MG/3ML IN SOLN
3.0000 mL | RESPIRATORY_TRACT | Status: AC
  Administered 2020-10-30 (×2): 3 mL via RESPIRATORY_TRACT
  Filled 2020-10-30 (×2): qty 3

## 2020-10-30 MED ORDER — IOHEXOL 350 MG/ML SOLN
100.0000 mL | Freq: Once | INTRAVENOUS | Status: AC | PRN
  Administered 2020-10-30: 100 mL via INTRAVENOUS

## 2020-10-30 MED ORDER — NITROGLYCERIN 0.4 MG SL SUBL
0.4000 mg | SUBLINGUAL_TABLET | SUBLINGUAL | Status: DC | PRN
  Administered 2020-10-31: 0.4 mg via SUBLINGUAL
  Filled 2020-10-30: qty 1

## 2020-10-30 NOTE — Evaluation (Signed)
Physical Therapy Evaluation Patient Details Name: [name] MRN: [id number] DOB: 04-26-1941 Today's Date: 10/30/2020   History of Present Illness  79yo female  with recent admit for sepsis due to pneumonia now c/o low BP, lightheadedness and weakness, heart palpitations and DOE. Admitted with AKI. PMH anxiety, arthralgia, CAD, CHF, LBP, COPD, hx DVT s/p IVC filter placement, dysrhythmia, exertional angina, gout, ankle surgery, back surgery, CABG, B TKR, B shoulder arthroplasty  Clinical Impression   Patient received sitting at EOB with nurse tech present, very pleasant and cooperative. Able to mobilize on a S-min guard basis with RW with her baseline 4LPM O2; no signs of orthostasis or O2 desat during session but did need cues to breathe through her nose instead of her mouth. Easily fatigued but reports this is her baseline. Has great support from family. Left up in recliner with all needs met- may benefit from skilled OP PT services to continue to further challenge her strength, balance, and functional endurance moving forward.     Follow Up Recommendations Outpatient PT;Supervision for mobility/OOB    Equipment Recommendations  None recommended by PT (well equipped)    Recommendations for Other Services       Precautions / Restrictions Precautions Precautions: Fall Precaution Comments: chronic 4L O2 [place] Restrictions Weight Bearing Restrictions: No      Mobility  Bed Mobility               General bed mobility comments: sitting at EOB with NT upon entry    Transfers Overall transfer level: Needs assistance Equipment used: Rolling walker (2 wheeled) Transfers: Sit to/from Stand Sit to Stand: Supervision         General transfer comment: S for transfers with RW, great hand placement and safety awareness  Ambulation/Gait Ambulation/Gait assistance: Min guard Gait Distance (Feet): 18 Feet Assistive device: Rolling walker (2 wheeled) Gait  Pattern/deviations: Step-through pattern;Trunk flexed Gait velocity: decreased   General Gait Details: slow and steady with RW, did need cues to breath through nose rather than open mouth; easily fatigued but reports this is her baseline and she will use WC or electric scooter for further distances  Stairs            Wheelchair Mobility    Modified Rankin (Stroke Patients Only)       Balance Overall balance assessment: Needs assistance Sitting-balance support: No upper extremity supported;Feet unsupported Sitting balance-Leahy Scale: Fair Sitting balance - Comments: mild posterior bias with feet not supported, resolved when she scooted forward and got feet on floor Postural control: Posterior lean Standing balance support: Bilateral upper extremity supported;During functional activity Standing balance-Leahy Scale: Fair Standing balance comment: steady with BUE support                             Pertinent Vitals/Pain Pain Assessment: 0-10 Pain Score: 4  Pain Location: generalized joint pain Pain Descriptors / Indicators: Aching;Sore Pain Intervention(s): Limited activity within patient's tolerance;Monitored during session    [place] expects to be discharged to:: Private residence Living Arrangements: Children (will be living with her son after DC) Available Help at Discharge: Family;Available 24 hours/day Type of Home: House Home Access: Stairs to enter Entrance Stairs-Rails: Can reach both;Right;Left Entrance Stairs-Number of Steps: 4-5 B rails Home Layout: One level Home Equipment: Walker - 2 wheels;Cane - single point;Wheelchair - manual;Shower seat;Bedside Community education officer Comments: on 4LPM O2 at baseline    Prior Function Level of  Independence: Independent with assistive device(s)         Comments: uses electric scooter when she goes out into community     Hand Dominance   Dominant Hand: Right     Extremity/Trunk Assessment   Upper Extremity Assessment Upper Extremity Assessment: Generalized weakness    Lower Extremity Assessment Lower Extremity Assessment: Generalized weakness    Cervical / Trunk Assessment Cervical / Trunk Assessment: Kyphotic  Communication   Communication: HOH  Cognition Arousal/Alertness: Awake/alert Behavior During Therapy: WFL for tasks assessed/performed Overall Cognitive Status: Within Functional Limits for tasks assessed                                        General Comments General comments (skin integrity, edema, etc.): no signs of orthostatis or O2 desaturation during session on 4LPM O2    Exercises     Assessment/Plan    PT Assessment Patient needs continued PT services  PT Problem List Decreased strength;Cardiopulmonary status limiting activity;Decreased activity tolerance;Decreased balance;Decreased knowledge of use of DME;Decreased mobility       PT Treatment Interventions DME instruction;Gait training;Therapeutic exercise;Balance training;Functional mobility training;Therapeutic activities;Patient/family education    PT Goals (Current goals can be found in the Care Plan section)  Acute Rehab PT Goals Patient Stated Goal: do what she needs to do to get better PT Goal Formulation: With patient Time For Goal Achievement: 11/13/20 Potential to Achieve Goals: Good    Frequency Min 3X/week   Barriers to discharge        Co-evaluation               AM-PAC PT "6 Clicks" Mobility  Outcome Measure Help needed turning from your back to your side while in a flat bed without using bedrails?: A Little Help needed moving from lying on your back to sitting on the side of a flat bed without using bedrails?: A Little Help needed moving to and from a bed to a chair (including a wheelchair)?: A Little Help needed standing up from a chair using your arms (e.g., wheelchair or bedside chair)?: A Little Help needed to  walk in hospital room?: A Little Help needed climbing 3-5 steps with a railing? : A Little 6 Click Score: 18    End of Session Equipment Utilized During Treatment: Oxygen;Gait belt Activity Tolerance: Patient tolerated treatment well Patient left: in chair;with call bell/phone within reach Nurse Communication: Mobility status PT Visit Diagnosis: Muscle weakness (generalized) (M62.81)    Time: 4259-5638 PT Time Calculation (min) (ACUTE ONLY): 26 min   Charges:   PT Evaluation $PT Eval Moderate Complexity: 1 Mod PT Treatments $Gait Training: 8-22 mins        Windell Norfolk, DPT, PN1   Supplemental Physical Therapist [place]    Pager [phone] Acute Rehab Office [phone]

## 2020-10-30 NOTE — Progress Notes (Signed)
  Progress Note    10/30/2020 10:17 AM * No surgery found *  Subjective: Patient working with PT  Vitals:   10/30/20 0840 10/30/20 0920  BP:  (!) 121/55  Pulse:  84  Resp:  19  Temp:  98.4 F (36.9 C)  SpO2: 98% 100%    Physical Exam: Awake alert oriented Stable bilateral lower extremity edema  CBC    Component Value Date/Time   WBC 6.0 10/30/2020 0249   RBC 3.36 (L) 10/30/2020 0249   HGB 9.8 (L) 10/30/2020 0249   HGB 12.6 09/10/2020 1535   HCT 32.4 (L) 10/30/2020 0249   HCT 38.8 09/10/2020 1535   PLT 179 10/30/2020 0249   PLT 245 09/10/2020 1535   MCV 96.4 10/30/2020 0249   MCV 90 09/10/2020 1535   MCH 29.2 10/30/2020 0249   MCHC 30.2 10/30/2020 0249   RDW 15.5 10/30/2020 0249   RDW 15.8 (H) 09/10/2020 1535   LYMPHSABS 1.7 10/13/2020 0444   MONOABS 1.0 10/13/2020 0444   EOSABS 0.0 10/13/2020 0444   BASOSABS 0.1 10/13/2020 0444    BMET    Component Value Date/Time   NA 141 10/30/2020 0249   NA 142 09/10/2020 1535   K 3.8 10/30/2020 0249   CL 102 10/30/2020 0249   CO2 32 10/30/2020 0249   GLUCOSE 98 10/30/2020 0249   BUN 12 10/30/2020 0249   BUN 28 (H) 09/10/2020 1535   CREATININE 0.81 10/30/2020 0249   CALCIUM 8.9 10/30/2020 0249   GFRNONAA >60 10/30/2020 0249   GFRAA 50 (L) 09/10/2020 1535    INR    Component Value Date/Time   INR 1.0 01/31/2020 1500     Intake/Output Summary (Last 24 hours) at 10/30/2020 1017 Last data filed at 10/30/2020 0800 Gross per 24 hour  Intake 1115.21 ml  Output 1 ml  Net 1114.21 ml     Assessment/plan:  79 y.o. female is here with syncopal episode acute kidney injury secondary to diuresis.  She has persistent swelling of her bilateral extremities with filter in place since 2019 appears to have thrombus by duplex.  We will get CT venogram given that her creatinine has normalized.  Possibly will need filter removal but would require general anesthesia.   [name] C. Donzetta Matters, MD [facility]  [place] Office: [phone] Pager: [phone]  10/30/2020 10:17 AM

## 2020-10-30 NOTE — Plan of Care (Signed)

## 2020-10-30 NOTE — Progress Notes (Addendum)
HOSPITAL MEDICINE OVERNIGHT EVENT NOTE    Patient complaining of sudden onset left sided chest dullness.    Patient explains that this episode is the same as her usual episodes of angina/chest pain she gets at home.  She explains she gets these episodes 2-3 times weekly at home and they typically resolve with PRN SL NTG.  Patient has a known Hx of CAD S/P CABG as well as CHF and frequent bouts of thromboembolic disease.   ECG - computer reads atrial fibrillation but based on my read there are obvious P waves - could also potentially be sinus tachycardia with frequent PAC's.  MAT is also possible albeit less likely.  Will administer SL nitroglycerin, monitor to ensure patient experiences complete resolution of discomfort.    Vernelle Emerald  MD [facility]   ADDENDUM 11/10 1AM  Patient experienced resolution of chest pain with NTG and is now sleeping.  Repeat ECG after NTG revealed sinus tachy with continued ectopy.  I believe it is most likely sinus tach but can be reviewed with cardiology by day team if there is concern for intermittent Afib.  Tele reveals sinus rhythm currently.  Sherryll Burger Paysen Goza

## 2020-10-30 NOTE — Progress Notes (Signed)
PROGRESS NOTE                                                                             PROGRESS NOTE                                                                                                                                                                                                             Patient Demographics:    [name], is a 79 y.o. female, DOB - 1941-11-21, VHQ:[id number]  Outpatient Primary MD for the patient is [name], [name], MD    LOS - 0  Admit date - 10/28/2020    Chief Complaint  Patient presents with  . Hypotension       Brief Narrative     79 y.o. 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  She last took them yesterday Saturday, 10/27/2020.  She mentions that she has been lightheaded and weak for the past few days.  Lightheadedness is positional.  She has had some palpitations and more dyspnea than usual on exertion.  She denies nausea, vomiting or diarrhea.  However, she complains of some mild dysuria and urinary tenesmus.  She denies chest pain, diaphoresis, PND, orthopnea.  She states that her lower extremity edema is better. -Her work-up was significant for orthostasis and AKI due to overdiuresis, this has resolved with IV hydration, she is having significant  lower extremity edema felt to be secondary to IVC filter thrombosis, vascular  surgery has been consulted.    Subjective:    [name] today reports her dyspnea at baseline, she denies any chest pain, fever or chills.     Assessment  & Plan :    Principal Problem:   AKI (acute kidney injury) ([place]) Active Problems:   Hypothyroidism   Essential hypertension   COPD (chronic obstructive pulmonary disease) (HCC)   Sleep apnea   Dyslipidemia   Chronic diastolic heart failure (HCC)   GERD without esophagitis   History of pulmonary embolism   Normocytic anemia    AKI /hypotension -Most likely due to overdiuresis, volume depletion and hypotension, creatinine elevated 2.2 on admission, it is improving with IV fluids, . nightly has normalized.    Subacute bilateral lower extremity edema/suspicion of IVC filter occlusion -Clinically she does not appear to be volume overload, or extremity edema most likely due to local process like venous insufficiency or IVC filter thrombosis, so vascular surgery were consulted . -Lower extremity edema most likely due to suspicion for partially occluded IVC filter, discussed with vascular surgery who reviewed ultrasound of iliac/inferior vena cava, measuring highly suspicious for occluded IVC filter, plan for CT venogram today given her creatinine has normalized, and if it is occluded she will need filter removal under general anesthesia per vascular surgery.  .  Chronic hypoxic respiratory failure -patient reports  she is on 2 to 4 L nasal cannula at baseline    Hypokalemia -Repleted    Hypothyroidism Continue levothyroxine 50 mcg p.o. daily.    Essential hypertension Holding diuretics. Monitor blood pressure. Continue gentle IV hydration.    COPD (chronic obstructive pulmonary disease) (HCC) Continue daily Daliresp, Spiriva and Combivent. -He is with some wheezing today, so we will give scheduled DuoNebs and will order one-time IV  steroids.    Sleep apnea No longer on CPAP.    Dyslipidemia Continue atorvastatin 20 mg p.o. daily    Chronic diastolic heart failure (HCC) Volume depleted. Hold diuretics.    GERD without esophagitis Pantoprazole 40 mg p.o. daily.    History of pulmonary embolism and DVT. The patient has history of IVC placement.    Normocytic anemia Monitor H&H. Transfuse as needed.  SpO2: 100 % O2 Flow Rate (L/min): 4 L/min  Recent Labs  Lab 10/28/20 0108 10/28/20 0659 10/28/20 1138 10/29/20 0257 10/30/20 0249  WBC 9.3  --  8.3 6.5 6.0  PLT 225  --  227 182 179  BNP 339.0*  --   --  425.5*  --   LATICACIDVEN  --  1.2  --   --   --   SARSCOV2NAA  --  NEGATIVE  --   --   --        ABG     Component Value Date/Time   PHART 7.374 03/14/2018 0127   PCO2ART 29.2 (L) 03/14/2018 0127   PO2ART 139 (H) 03/14/2018 0127   HCO3 16.7 (L) 03/14/2018 0127   TCO2 30 10/29/2011 0925   ACIDBASEDEF 7.6 (H) 03/14/2018 0127   O2SAT 98.8 03/14/2018 0127        Condition - Extremely Guarded  Family Communication  :  Daughter in Sports coach  at bedside  Code Status :  Full  Consults  :  none  Procedures  :  none  Disposition Plan  :    Status VF:[id number]  The patient will require care spanning > 2 midnights and should be moved to inpatient because: Ongoing diagnostic testing needed not appropriate for outpatient work up and  IV treatments appropriate due to intensity of illness or inability to take PO  Dispo: The patient is from: Home              Anticipated d/c is to: Home              Anticipated d/c date is: 1 day              Patient currently is not medically stable to d/c.  Patient is not medically for discharge yet, AKI is improving, but high suspicion for occlusive IVC for which she will require venogram once her function has improved, will continue with gentle hydration hopefully more studies and possible IVC filter retrieval in 48 hours once renal function back to  baseline.      DVT Prophylaxis  :  [place] Heparin   Lab Results  Component Value Date   PLT 179 10/30/2020    Diet :  Diet Order            Diet Heart Room service appropriate? Yes; Fluid consistency: Thin  Diet effective now                  Inpatient Medications  Scheduled Meds: . allopurinol  100 mg Oral Daily  . ARIPiprazole  1 mg Oral Daily  . aspirin  81 mg Oral Daily  . atorvastatin  20 mg Oral Daily  . citalopram  20 mg Oral QHS  . ferrous sulfate  325 mg Oral Daily  . gabapentin  100 mg Oral Daily  . heparin injection (subcutaneous)  5,000 Units Subcutaneous Q8H  . levothyroxine  50 mcg Oral Q0600  . magnesium oxide  400 mg Oral BID  . melatonin  10 mg Oral QHS  . ofloxacin  5 drop Left EAR Daily  . oxyCODONE  10 mg Oral Q12H  . pantoprazole  40 mg Oral Daily  . pilocarpine  1 drop Both Eyes BID  . polyethylene glycol  17 g Oral Daily  . potassium chloride SA  20 mEq Oral Daily  . pramipexole  0.25 mg Oral QPM  . roflumilast  500 mcg Oral QPM  . traZODone  150 mg Oral QHS  . umeclidinium bromide  1 puff Inhalation Daily   Continuous Infusions: . sodium chloride 50 mL/hr at 10/29/20 2012   PRN Meds:.acetaminophen **OR** acetaminophen, Ipratropium-Albuterol  Antibiotics  :    Anti-infectives (From admission, onward)   None      Phillips Climes M.D on 10/30/2020 at 2:11 PM  To page go to [email or web address]   [facility] -  Office  [phone]        Objective:   Vitals:   10/29/20 2047 10/30/20 0604 10/30/20 0840 10/30/20 0920  BP: 118/66 118/60  (!) 121/55  Pulse: 79 76  84  Resp: 18 20  19   Temp: 97.8 F (36.6 C) 99.1 F (37.3 C)  98.4 F (36.9 C)  TempSrc: Oral Oral  Oral  SpO2: 94% 98% 98% 100%  Weight: 82 kg     Height:        Wt Readings from Last 3 Encounters:  10/29/20 82 kg  10/13/20 77.8 kg  09/10/20 75.3 kg     Intake/Output Summary (Last 24 hours) at 10/30/2020 1411 Last data filed at 10/30/2020 1200 Gross  per 24 hour  Intake 1607.58 ml  Output 0 ml  Net 1607.58 ml     Physical Exam  Awake Alert, Oriented X 3,frail, No new F.N deficits, Normal affect  Symmetrical Chest wall movement, Good air movement bilaterally, CTAB RRR,No Gallops,Rubs or new Murmurs, No Parasternal Heave +ve B.Sounds, Abd Soft, No tenderness, No rebound - guarding or rigidity. No Cyanosis, Clubbing, with +3 lower extremity edema with erythema.    Data Review:    CBC Recent Labs  Lab 10/28/20 0108 10/28/20 1138 10/29/20 0257 10/30/20 0249  WBC 9.3 8.3 6.5 6.0  HGB 9.8* 9.9* 10.0* 9.8*  HCT 31.9* 32.6* 32.9* 32.4*  PLT 225 227 182 179  MCV 94.9 95.3 96.2 96.4  MCH 29.2 28.9 29.2 29.2  MCHC 30.7 30.4 30.4 30.2  RDW 15.9* 15.9* 16.0* 15.5    Recent Labs  Lab 10/28/20 0108 10/28/20 0659 10/28/20 1138 10/29/20 0257 10/30/20 0249  NA 138  --  139 139 141  K 3.2*  --  3.7 4.0 3.8  CL 93*  --  97* 102 102  CO2 34*  --  33* 29 32  GLUCOSE 76  --  97 101* 98  BUN 24*  --  22 20 12   CREATININE 2.20*  --  1.60* 1.24* 0.81  CALCIUM 8.7*  --  8.6* 8.6* 8.9  LATICACIDVEN  --  1.2  --   --   --   BNP 339.0*  --   --  425.5*  --     ------------------------------------------------------------------------------------------------------------------ No results for input(s): CHOL, HDL, LDLCALC, TRIG, CHOLHDL, LDLDIRECT in the last 72 hours.  No results found for: HGBA1C ------------------------------------------------------------------------------------------------------------------ No results for input(s): TSH, T4TOTAL, T3FREE, THYROIDAB in the last 72 hours.  Invalid input(s): FREET3  Cardiac Enzymes No results for input(s): CKMB, TROPONINI, MYOGLOBIN in the last 168 hours.  Invalid input(s): CK ------------------------------------------------------------------------------------------------------------------    Component Value Date/Time   BNP 425.5 (H) 10/29/2020 0257    Micro Results Recent  Results (from the past 240 hour(s))  Respiratory Panel by RT PCR (Flu A&B, Covid) - Nasopharyngeal Swab     Status: None   Collection Time: 10/28/20  6:59 AM   Specimen: Nasopharyngeal Swab  Result Value Ref Range Status   SARS Coronavirus 2 by RT PCR NEGATIVE NEGATIVE Final    Comment: (NOTE) SARS-CoV-2 target nucleic acids are NOT DETECTED.  The SARS-CoV-2 RNA is generally detectable in upper respiratoy specimens during the acute phase of infection. The lowest concentration of SARS-CoV-2 viral copies this assay can detect is 131 copies/mL. A negative result does not preclude SARS-Cov-2 infection and should not be used as the sole basis for treatment or other patient management decisions. A negative result may occur with  improper specimen collection/handling, submission of specimen other than nasopharyngeal swab, presence of viral mutation(s) within the areas targeted by this assay, and inadequate number of viral copies (<131 copies/mL). A negative result must be combined with clinical observations, patient history, and epidemiological information. The expected result is Negative.  Fact Sheet for Patients:  PinkCheek.be  Fact Sheet for Healthcare Providers:  GravelBags.it  This test is no t yet approved or cleared by the [place] FDA and  has been authorized for detection and/or diagnosis of SARS-CoV-2 by FDA under an Emergency Use Authorization (EUA). This EUA will remain  in effect (meaning this test can be used) for the duration of the COVID-19 declaration under Section 564(b)(1) of the Act, 21 U.S.C. section 360bbb-3(b)(1), unless the authorization is terminated or revoked sooner.     Influenza A by PCR NEGATIVE NEGATIVE Final   Influenza B by PCR NEGATIVE NEGATIVE Final    Comment: (NOTE) The Xpert Xpress SARS-CoV-2/FLU/RSV  assay is intended as an aid in  the diagnosis of influenza from Nasopharyngeal swab  specimens and  should not be used as a sole basis for treatment. Nasal washings and  aspirates are unacceptable for Xpert Xpress SARS-CoV-2/FLU/RSV  testing.  Fact Sheet for Patients: PinkCheek.be  Fact Sheet for Healthcare Providers: GravelBags.it  This test is not yet approved or cleared by the [place] FDA and  has been authorized for detection and/or diagnosis of SARS-CoV-2 by  FDA under an Emergency Use Authorization (EUA). This EUA will remain  in effect (meaning this test can be used) for the duration of the  Covid-19 declaration under Section 564(b)(1) of the Act, 21  U.S.C. section 360bbb-3(b)(1), unless the authorization is  terminated or revoked. Performed at [facility], [place] [address], [place], [place] [address]     Radiology Reports NM Pulmonary Perfusion  Result Date: 10/11/2020 CLINICAL DATA:  Shortness of breath and chest pain EXAM: NUCLEAR MEDICINE PERFUSION LUNG SCAN TECHNIQUE: Perfusion images were obtained in multiple projections after intravenous injection of radiopharmaceutical. Views: Anterior, posterior, RPO, LPO, RAO, LAO RADIOPHARMACEUTICALS:  4.2 mCi Tc-91m MAA IV COMPARISON:  Chest radiograph October 10, 2020 FINDINGS: Distribution of radiotracer uptake is homogeneous and symmetric bilaterally. No appreciable perfusion defects. IMPRESSION: No appreciable perfusion defects. No findings indicative of pulmonary embolus. Electronically Signed   By: Lowella Grip III M.D.   On: 10/11/2020 11:07   VAS [place] IVC/ILIAC (VENOUS ONLY)  Result Date: 10/29/2020 IVC/ILIAC STUDY Indications: Chronic Leg DVT Risk Factors: Hypertension.  Comparison Study: None Performing Technologist: Griffin Basil RVT, RDMS  Examination Guidelines: A complete evaluation includes B-mode imaging, spectral Doppler, color Doppler, and power Doppler as needed of all accessible portions of each vessel. Bilateral testing is  considered an integral part of a complete examination. Limited examinations for reoccurring indications may be performed as noted.  IVC/Iliac Findings: +----------+------+--------+--------+    IVC    PatentThrombusComments +----------+------+--------+--------+ IVC Prox  patent                 +----------+------+--------+--------+ IVC Mid         chronic          +----------+------+--------+--------+ IVC Distalpatent                 +----------+------+--------+--------+  +-------------------+---------+-----------+---------+-----------+--------+         CIV        RT-PatentRT-ThrombusLT-PatentLT-ThrombusComments +-------------------+---------+-----------+---------+-----------+--------+ Common Iliac Prox   patent              patent                      +-------------------+---------+-----------+---------+-----------+--------+ Common Iliac Mid    patent              patent                      +-------------------+---------+-----------+---------+-----------+--------+ Common Iliac Distal patent              patent                      +-------------------+---------+-----------+---------+-----------+--------+    Summary: IVC/Iliac: Mid IVC Thrombus , Hyperechoic non occlusive.  *See table(s) above for measurements and observations.  Electronically signed by Monica Martinez MD on 10/29/2020 at 3:24:42 PM.   Final    DG Chest Portable 1 View  Result Date: 10/28/2020 CLINICAL DATA:  Shortness of breath EXAM: PORTABLE CHEST 1 VIEW COMPARISON:  10/10/2020 FINDINGS: The heart size and mediastinal contours are within normal limits. Both lungs are clear. The visualized skeletal structures are unremarkable. The patient is status post prior median sternotomy. Aortic calcifications are noted. There are calcified bilateral breast implants. There is pleuroparenchymal scarring at the lung apices. There are end-stage degenerative changes of the left glenohumeral joint. The patient is  status post prior total shoulder arthroplasty on the right. IMPRESSION: No active disease. Electronically Signed   By: Constance Holster M.D.   On: 10/28/2020 01:49   DG CHEST PORT 1 VIEW  Result Date: 10/10/2020 CLINICAL DATA:  Dyspnea. EXAM: PORTABLE CHEST 1 VIEW COMPARISON:  October 09, 2020. FINDINGS: Stable cardiomediastinal silhouette. Status post coronary bypass graft. No pneumothorax or pleural effusion is noted. No acute pulmonary disease is noted. Status post right shoulder arthroplasty. IMPRESSION: No active disease. Aortic Atherosclerosis (ICD10-I70.0). Electronically Signed   By: Marijo Conception M.D.   On: 10/10/2020 10:13   DG Chest Port 1 View  Result Date: 10/09/2020 CLINICAL DATA:  Shortness of breath, cough and fever. EXAM: PORTABLE CHEST 1 VIEW COMPARISON:  10/05/2019 FINDINGS: Previous median sternotomy and CABG. Mild cardiomegaly. Aortic atherosclerosis. Bilateral breast implants with density overlying the chest. Suspicion of bronchitis and hazy pneumonia in the mid and lower lungs. No dense consolidation, lobar collapse or effusion. Old shoulder replacement on the right. Chronic degenerative changes of the left shoulder. IMPRESSION: 1. Suspicion of bronchitis and hazy pneumonia in the mid and lower lungs. No dense consolidation or lobar collapse. 2. Previous CABG. Mild cardiomegaly. Aortic atherosclerosis. Electronically Signed   By: Nelson Chimes M.D.   On: 10/09/2020 13:15   ECHOCARDIOGRAM COMPLETE  Result Date: 10/09/2020    ECHOCARDIOGRAM REPORT   Patient Name:   SHAYANNE GOMM Date of Exam: 10/09/2020 Medical Rec #:  [id number]      Height:       68.0 in Accession #:    [id number]     Weight:       167.5 lb Date of Birth:  [birth year]       BSA:          1.896 m Patient Age:    67 years       BP:           92/42 mmHg Patient Gender: F              HR:           88 bpm. Exam Location:  Inpatient Procedure: Cardiac Doppler, Color Doppler, 2D Echo and 3D Echo STAT ECHO  Indications:    R07.9* Chest pain, unspecified  History:        Patient has prior history of Echocardiogram examinations, most                 recent 10/05/2019. CAD, Abnormal ECG and Prior CABG, COPD,                 Signs/Symptoms:Dyspnea; Risk Factors:Sleep Apnea and                 Dyslipidemia. Hypoxia. Pulmonary embolus.  Sonographer:    Roseanna Rainbow RDCS Referring Phys: [id number] [place]  1. Diastology suggestive of impaired LV filling. Left ventricular ejection fraction, by estimation, is 60 to 65%. The left ventricle has normal function. The left ventricle has no regional wall motion abnormalities. There is moderate concentric left ventricular hypertrophy. Left ventricular diastolic parameters are indeterminate.  2. Right ventricular systolic function is  low normal. The right ventricular size is normal. There is normal pulmonary artery systolic pressure.  3. Left atrial size was moderately dilated.  4. Right atrial size was moderately dilated.  5. The mitral valve is myxomatous. No evidence of mitral valve regurgitation. The mean mitral valve gradient is 5.0 mmHg with average heart rate of 87 bpm. Moderate mitral annular calcification.  6. The aortic valve is tricuspid. Aortic valve regurgitation is not visualized.  7. The inferior vena cava is dilated in size with <50% respiratory variability, suggesting right atrial pressure of 15 mmHg. Comparison(s): A prior study was performed on 10/05/2019. No significant change from prior study. Subtle decrease in RV function. FINDINGS  Left Ventricle: Diastology suggestive of impaired LV filling. Left ventricular ejection fraction, by estimation, is 60 to 65%. The left ventricle has normal function. The left ventricle has no regional wall motion abnormalities. The left ventricular internal cavity size was normal in size. There is moderate concentric left ventricular hypertrophy. Left ventricular diastolic parameters are indeterminate. Right  Ventricle: The right ventricular size is normal. Right vetricular wall thickness was not well visualized. Right ventricular systolic function is low normal. There is normal pulmonary artery systolic pressure. The tricuspid regurgitant velocity is 2.30 m/s, and with an assumed right atrial pressure of 3 mmHg, the estimated right ventricular systolic pressure is 16.6 mmHg. Left Atrium: Left atrial size was moderately dilated. Right Atrium: Right atrial size was moderately dilated. Pericardium: There is no evidence of pericardial effusion. Mitral Valve: The mitral valve is myxomatous. There is mild calcification of the mitral valve leaflet(s). Moderate mitral annular calcification. No evidence of mitral valve regurgitation. MV peak gradient, 25.4 mmHg. The mean mitral valve gradient is 5.0  mmHg with average heart rate of 87 bpm. Tricuspid Valve: The tricuspid valve is grossly normal. Tricuspid valve regurgitation is trivial. Aortic Valve: The aortic valve is tricuspid. Aortic valve regurgitation is not visualized. Pulmonic Valve: The pulmonic valve was not well visualized. Pulmonic valve regurgitation is trivial. Aorta: The aortic root and ascending aorta are structurally normal, with no evidence of dilitation. Venous: The inferior vena cava is dilated in size with less than 50% respiratory variability, suggesting right atrial pressure of 15 mmHg. IAS/Shunts: The interatrial septum appears to be lipomatous. The atrial septum is grossly normal.  LEFT VENTRICLE PLAX 2D LVIDd:         4.60 cm     Diastology LVIDs:         3.00 cm     LV e' medial:    5.87 cm/s LV PW:         1.40 cm     LV E/e' medial:  27.1 LV IVS:        1.40 cm     LV e' lateral:   8.21 cm/s LVOT diam:     1.70 cm     LV E/e' lateral: 19.4 LV SV:         62 LV SV Index:   33 LVOT Area:     2.27 cm  LV Volumes (MOD) LV vol d, MOD A2C: 99.9 ml LV vol d, MOD A4C: 71.4 ml LV vol s, MOD A2C: 35.9 ml LV vol s, MOD A4C: 26.8 ml LV SV MOD A2C:     64.0 ml LV  SV MOD A4C:     71.4 ml LV SV MOD BP:      56.6 ml RIGHT VENTRICLE            IVC RV  S prime:     8.08 cm/s  IVC diam: 3.00 cm TAPSE (M-mode): 1.4 cm LEFT ATRIUM             Index       RIGHT ATRIUM           Index LA diam:        3.80 cm 2.00 cm/m  RA Area:     17.30 cm LA Vol (A2C):   56.3 ml 29.70 ml/m RA Volume:   44.90 ml  23.69 ml/m LA Vol (A4C):   44.9 ml 23.69 ml/m LA Biplane Vol: 55.6 ml 29.33 ml/m  AORTIC VALVE LVOT Vmax:   129.00 cm/s LVOT Vmean:  86.900 cm/s LVOT VTI:    0.272 m  AORTA Ao Root diam: 3.40 cm Ao Asc diam:  3.40 cm MITRAL VALVE                TRICUSPID VALVE MV Area (PHT): 4.15 cm     TR Peak grad:   21.2 mmHg MV Peak grad:  25.4 mmHg    TR Vmax:        230.00 cm/s MV Mean grad:  5.0 mmHg MV Vmax:       2.52 m/s     SHUNTS MV Vmean:      88.8 cm/s    Systemic VTI:  0.27 m MV Decel Time: 183 msec     Systemic Diam: 1.70 cm MV E velocity: 159.00 cm/s MV A velocity: 209.00 cm/s MV E/A ratio:  0.76 Rudean Haskell MD Electronically signed by Rudean Haskell MD Signature Date/Time: 10/09/2020/4:38:55 PM    Final    VAS [place] LOWER EXTREMITY VENOUS (DVT)  Result Date: 10/10/2020  Lower Venous DVTStudy Indications: Elevated ddimer.  Comparison Study: previous 11/03/18 Performing Technologist: Abram Sander RVS  Examination Guidelines: A complete evaluation includes B-mode imaging, spectral Doppler, color Doppler, and power Doppler as needed of all accessible portions of each vessel. Bilateral testing is considered an integral part of a complete examination. Limited examinations for reoccurring indications may be performed as noted. The reflux portion of the exam is performed with the patient in reverse Trendelenburg.  +---------+---------------+---------+-----------+----------+--------------+ RIGHT    CompressibilityPhasicitySpontaneityPropertiesThrombus Aging +---------+---------------+---------+-----------+----------+--------------+ CFV      Full           Yes       Yes                                 +---------+---------------+---------+-----------+----------+--------------+ SFJ      Full                                                        +---------+---------------+---------+-----------+----------+--------------+ FV Prox  Full                                                        +---------+---------------+---------+-----------+----------+--------------+ FV Mid   Full                                                        +---------+---------------+---------+-----------+----------+--------------+  FV DistalFull                                                        +---------+---------------+---------+-----------+----------+--------------+ PFV      Full                                                        +---------+---------------+---------+-----------+----------+--------------+ POP      None           Yes      Yes                  Chronic        +---------+---------------+---------+-----------+----------+--------------+ PTV      Full                                                        +---------+---------------+---------+-----------+----------+--------------+ PERO     Full                                                        +---------+---------------+---------+-----------+----------+--------------+   +---------+---------------+---------+-----------+----------+--------------+ LEFT     CompressibilityPhasicitySpontaneityPropertiesThrombus Aging +---------+---------------+---------+-----------+----------+--------------+ CFV      Full           Yes      Yes                                 +---------+---------------+---------+-----------+----------+--------------+ SFJ      Full                                                        +---------+---------------+---------+-----------+----------+--------------+ FV Prox  Full                                                         +---------+---------------+---------+-----------+----------+--------------+ FV Mid   Full                                                        +---------+---------------+---------+-----------+----------+--------------+ FV DistalFull                                                        +---------+---------------+---------+-----------+----------+--------------+  PFV      Full                                                        +---------+---------------+---------+-----------+----------+--------------+ POP      Full           Yes      Yes                                 +---------+---------------+---------+-----------+----------+--------------+ PTV      Full                                                        +---------+---------------+---------+-----------+----------+--------------+ PERO     Full                                                        +---------+---------------+---------+-----------+----------+--------------+     Summary: RIGHT: - Findings consistent with chronic deep vein thrombosis involving the right popliteal vein. - No cystic structure found in the popliteal fossa.  LEFT: - There is no evidence of deep vein thrombosis in the lower extremity.  - No cystic structure found in the popliteal fossa.  *See table(s) above for measurements and observations. Electronically signed by Harold Barban MD on 10/10/2020 at 7:40:41 PM.    Final    VAS [place] UPPER EXTREMITY VENOUS DUPLEX  Result Date: 10/10/2020 UPPER VENOUS STUDY  Indications: elevated ddimer Comparison Study: no prior Performing Technologist: Abram Sander RVS  Examination Guidelines: A complete evaluation includes B-mode imaging, spectral Doppler, color Doppler, and power Doppler as needed of all accessible portions of each vessel. Bilateral testing is considered an integral part of a complete examination. Limited examinations for reoccurring indications may be performed as noted.  Right  Findings: +----------+------------+---------+-----------+----------+-------+ RIGHT     CompressiblePhasicitySpontaneousPropertiesSummary +----------+------------+---------+-----------+----------+-------+ IJV           Full       Yes       Yes                      +----------+------------+---------+-----------+----------+-------+ Subclavian    Full       Yes       Yes                      +----------+------------+---------+-----------+----------+-------+ Axillary      Full       Yes       Yes                      +----------+------------+---------+-----------+----------+-------+ Brachial      Full       Yes       Yes                      +----------+------------+---------+-----------+----------+-------+ Radial        Full                                          +----------+------------+---------+-----------+----------+-------+  Ulnar         Full                                          +----------+------------+---------+-----------+----------+-------+ Cephalic      Full                                          +----------+------------+---------+-----------+----------+-------+ Basilic       Full                                          +----------+------------+---------+-----------+----------+-------+  Left Findings: +----------+------------+---------+-----------+----------+-------+ LEFT      CompressiblePhasicitySpontaneousPropertiesSummary +----------+------------+---------+-----------+----------+-------+ IJV           Full       Yes       Yes                      +----------+------------+---------+-----------+----------+-------+ Subclavian    Full       Yes       Yes                      +----------+------------+---------+-----------+----------+-------+ Axillary      Full       Yes       Yes                      +----------+------------+---------+-----------+----------+-------+ Brachial      Full       Yes       Yes                       +----------+------------+---------+-----------+----------+-------+ Radial        Full                                          +----------+------------+---------+-----------+----------+-------+ Ulnar         Full                                          +----------+------------+---------+-----------+----------+-------+ Cephalic      Full                                          +----------+------------+---------+-----------+----------+-------+ Basilic       Full                                          +----------+------------+---------+-----------+----------+-------+  Summary: No evidence of deep vein or superficial vein thrombosis involving the right and left upper extremities.  *See table(s) above for measurements and observations.  Diagnosing physician: Harold Barban MD Electronically signed by Harold Barban MD on 10/10/2020 at 7:40:28 PM.    Final

## 2020-10-30 NOTE — Progress Notes (Signed)
   10/30/20 2338  Vitals  BP 131/77  BP Location Left Arm  BP Method Automatic  Patient Position (if appropriate) Sitting  Pulse Rate 91  Pulse Rate Source Monitor  Resp 20  Level of Consciousness  Level of Consciousness Alert  MEWS COLOR  MEWS Score Color Green  Oxygen Therapy  SpO2 92 %  O2 Device Nasal Cannula  O2 Flow Rate (L/min) 4 L/min  MEWS Score  MEWS Temp 0  MEWS Systolic 0  MEWS Pulse 0  MEWS RR 0  MEWS LOC 0  MEWS Score 0   Pt complaint of dull aching chest discomfort similar to what she has experienced at home before. EKG performed and [facility] on call hospitalist called. New order to give Nitroglycerin and obtain a new EKG given. Pt's vitals are stable and no other distress noted. Will continue monitoring and will escalate if needed.

## 2020-10-31 LAB — BASIC METABOLIC PANEL
Anion gap: 10 (ref 5–15)
BUN: 11 mg/dL (ref 8–23)
CO2: 26 mmol/L (ref 22–32)
Calcium: 8.7 mg/dL — ABNORMAL LOW (ref 8.9–10.3)
Chloride: 105 mmol/L (ref 98–111)
Creatinine, Ser: 0.97 mg/dL (ref 0.44–1.00)
GFR, Estimated: 59 mL/min — ABNORMAL LOW (ref >60)
Glucose, Bld: 179 mg/dL — ABNORMAL HIGH (ref 70–99)
Potassium: 3.7 mmol/L (ref 3.5–5.1)
Sodium: 141 mmol/L (ref 135–145)

## 2020-10-31 LAB — CBC
HCT: 28.5 % — ABNORMAL LOW (ref 36.0–46.0)
Hemoglobin: 8.7 g/dL — ABNORMAL LOW (ref 12.0–15.0)
MCH: 29.3 pg (ref 26.0–34.0)
MCHC: 30.5 g/dL (ref 30.0–36.0)
MCV: 96 fL (ref 80.0–100.0)
Platelets: 175 10*3/uL (ref 150–400)
RBC: 2.97 MIL/uL — ABNORMAL LOW (ref 3.87–5.11)
RDW: 15.5 % (ref 11.5–15.5)
WBC: 4.1 10*3/uL (ref 4.0–10.5)
nRBC: 0 % (ref 0.0–0.2)

## 2020-10-31 MED ORDER — POTASSIUM CHLORIDE CRYS ER 20 MEQ PO TBCR
40.0000 meq | EXTENDED_RELEASE_TABLET | Freq: Two times a day (BID) | ORAL | Status: AC
  Administered 2020-10-31 (×2): 40 meq via ORAL
  Filled 2020-10-31 (×2): qty 2

## 2020-10-31 MED ORDER — NITROGLYCERIN 0.4 MG SL SUBL
SUBLINGUAL_TABLET | SUBLINGUAL | Status: AC
  Administered 2020-10-31: 0.4 mg via SUBLINGUAL
  Filled 2020-10-31: qty 1

## 2020-10-31 MED ORDER — FUROSEMIDE 10 MG/ML IJ SOLN
40.0000 mg | Freq: Two times a day (BID) | INTRAMUSCULAR | Status: AC
  Administered 2020-10-31 (×2): 40 mg via INTRAVENOUS
  Filled 2020-10-31 (×2): qty 4

## 2020-10-31 NOTE — Progress Notes (Signed)
PROGRESS NOTE                                                                             PROGRESS NOTE                                                                                                                                                                                                             Patient Demographics:    [name], is a 79 y.o. female, DOB - 24-Oct-1941, KWI:[id number]  Outpatient Primary MD for the patient is [name], [name], MD    LOS - 1  Admit date - 10/28/2020    Chief Complaint  Patient presents with  . Hypotension       Brief Narrative     79 y.o. female with medical history significant of CAD, CABG, chronic diastolic CHF,  carotid artery disease, depression, anxiety, recurrent DVT and PEs with GI bleeds from anticoagulation, status post IVC filter placement in 2019, chronic lower back pain, COPD, paroxysmal atrial fibrillation after CABG, GERD, hyperlipidemia, hypertension, hypothyroidism, class I obesity, osteoarthritis, history of pneumonia, peripheral vascular disease, sleep apnea no longer on CPAP who was recently discharged for sepsis secondary to pneumonia now coming to the emergency department due to low blood pressure after she has been taking higher than usual furosemide dose of 80 mg in the morning and 40 mg in the evening. H/o being lightheaded and weak for the past few days.  -Her work-up was significant for orthostasis and AKI , this has resolved with IV hydration, she is having significant lower extremity edema felt to be secondary to venous stasis, IVC filter and fluid overload    Subjective:    [name] reports dyspnea today, also complains of worsening swelling   Assessment  & Plan :    AKI /hypotension -Most likely due to overdiuresis, volume depletion and hypotension, creatinine elevated 2.2 on admission,  -Improved with IV fluids, discontinue saline today  Subacute bilateral lower  extremity edema -I think this is multifactorial, secondary to venous stasis,  fluid overload and IVC filter -Appreciate vascular input, CT venogram rules out complete occlusion of IVC -Add compression stockings, Lasix x2 doses today -Monitor kidney function closely  Acute on chronic diastolic CHF -IV Lasix as noted above    Hypokalemia -Repleted    Hypothyroidism Continue levothyroxine 50 mcg p.o. daily.    Essential hypertension -Stable, monitor    COPD (chronic obstructive pulmonary disease) (HCC) Chronic hypoxic respiratory failure Continue daily Daliresp, Spiriva and Combivent. -Stable, no wheezing    Sleep apnea No longer on CPAP.    Dyslipidemia Continue atorvastatin 20 mg p.o. daily    GERD without esophagitis Pantoprazole 40 mg p.o. daily.    History of pulmonary embolism and DVT. The patient has history of IVC placement.    Normocytic anemia Monitor H&H. Transfuse as needed.   DVT prophylaxis: Heparin subcutaneous  Family Communication  : No family at bedside Code Status :  Full  Disposition Plan  :    Status IR:[id number]  The patient will require care spanning > 2 midnights and should be moved to inpatient because: Ongoing diagnostic testing needed not appropriate for outpatient work up and IV treatments appropriate due to intensity of illness or inability to take PO  Dispo: The patient is from: Home              Anticipated d/c is to: Home              Anticipated d/c date is: Likely 48 hours             Patient currently is not medically stable to d/c.  Marland Kitchen   Lab Results  Component Value Date   PLT 175 10/31/2020    Diet :  Diet Order            Diet Heart Room service appropriate? Yes; Fluid consistency: Thin  Diet effective now                  Inpatient Medications  Scheduled Meds: . allopurinol  100 mg Oral Daily  . ARIPiprazole  1 mg Oral Daily  . aspirin  81 mg Oral Daily  . atorvastatin  20 mg Oral Daily  . citalopram   20 mg Oral QHS  . ferrous sulfate  325 mg Oral Daily  . furosemide  40 mg Intravenous Q12H  . gabapentin  100 mg Oral Daily  . heparin injection (subcutaneous)  5,000 Units Subcutaneous Q8H  . levothyroxine  50 mcg Oral Q0600  . magnesium oxide  400 mg Oral BID  . melatonin  10 mg Oral QHS  . ofloxacin  5 drop Left EAR Daily  . oxyCODONE  10 mg Oral Q12H  . pantoprazole  40 mg Oral Daily  . pilocarpine  1 drop Both Eyes BID  . polyethylene glycol  17 g Oral Daily  . potassium chloride SA  40 mEq Oral BID  . pramipexole  0.25 mg Oral QPM  . roflumilast  500 mcg Oral QPM  . traZODone  150 mg Oral QHS  . umeclidinium bromide  1 puff Inhalation Daily   Continuous Infusions:  PRN Meds:.acetaminophen **OR** acetaminophen, Ipratropium-Albuterol, nitroGLYCERIN  Antibiotics  :    Anti-infectives (From admission, onward)   None      Domenic Polite M.D on 10/31/2020 at 1:20 PM  To page go to [email or web address]   [facility] -  Office  [phone]    Objective:   Vitals:   10/30/20 9371 10/31/20 0017 10/31/20 6967 10/31/20 8938  BP: 131/77 107/62 (!) 120/58 109/61  Pulse: 91 99 81 80  Resp: 20 18 20 18   Temp:  98.6 F (37 C) 97.8 F (36.6 C) 98.1 F (36.7 C)  TempSrc:  Oral Oral Oral  SpO2: 92% 97% 100% 100%  Weight:      Height:        Wt Readings from Last 3 Encounters:  10/29/20 82 kg  10/13/20 77.8 kg  09/10/20 75.3 kg     Intake/Output Summary (Last 24 hours) at 10/31/2020 1320 Last data filed at 10/31/2020 0900 Gross per 24 hour  Intake 1621.42 ml  Output 0 ml  Net 1621.42 ml     Physical Exam Gen: Awake, Alert, Oriented X 3, no distress Lungs: Few basilar rales CVS: S1-S2, regular rate rhythm Abd: soft, Non tender, non distended, BS present Extremities: 2+ edema bilaterally Skin: no new rashes   Data Review:    CBC Recent Labs  Lab 10/28/20 0108 10/28/20 1138 10/29/20 0257 10/30/20 0249 10/31/20 0059  WBC 9.3 8.3 6.5 6.0 4.1   HGB 9.8* 9.9* 10.0* 9.8* 8.7*  HCT 31.9* 32.6* 32.9* 32.4* 28.5*  PLT 225 227 182 179 175  MCV 94.9 95.3 96.2 96.4 96.0  MCH 29.2 28.9 29.2 29.2 29.3  MCHC 30.7 30.4 30.4 30.2 30.5  RDW 15.9* 15.9* 16.0* 15.5 15.5    Recent Labs  Lab 10/28/20 0108 10/28/20 0659 10/28/20 1138 10/29/20 0257 10/30/20 0249 10/31/20 0059  NA 138  --  139 139 141 141  K 3.2*  --  3.7 4.0 3.8 3.7  CL 93*  --  97* 102 102 105  CO2 34*  --  33* 29 32 26  GLUCOSE 76  --  97 101* 98 179*  BUN 24*  --  22 20 12 11   CREATININE 2.20*  --  1.60* 1.24* 0.81 0.97  CALCIUM 8.7*  --  8.6* 8.6* 8.9 8.7*  LATICACIDVEN  --  1.2  --   --   --   --   BNP 339.0*  --   --  425.5*  --   --     ------------------------------------------------------------------------------------------------------------------ No results for input(s): CHOL, HDL, LDLCALC, TRIG, CHOLHDL, LDLDIRECT in the last 72 hours.  No results found for: HGBA1C ------------------------------------------------------------------------------------------------------------------ No results for input(s): TSH, T4TOTAL, T3FREE, THYROIDAB in the last 72 hours.  Invalid input(s): FREET3  Cardiac Enzymes No results for input(s): CKMB, TROPONINI, MYOGLOBIN in the last 168 hours.  Invalid input(s): CK ------------------------------------------------------------------------------------------------------------------    Component Value Date/Time   BNP 425.5 (H) 10/29/2020 0257    Micro Results Recent Results (from the past 240 hour(s))  Respiratory Panel by RT PCR (Flu A&B, Covid) - Nasopharyngeal Swab     Status: None   Collection Time: 10/28/20  6:59 AM   Specimen: Nasopharyngeal Swab  Result Value Ref Range Status   SARS Coronavirus 2 by RT PCR NEGATIVE NEGATIVE Final    Comment: (NOTE) SARS-CoV-2 target nucleic acids are NOT DETECTED.  The SARS-CoV-2 RNA is generally detectable in upper respiratoy specimens during the acute phase of infection.  The lowest concentration of SARS-CoV-2 viral copies this assay can detect is 131 copies/mL. A negative result does not preclude SARS-Cov-2 infection and should not be used as the sole basis for treatment or other patient management decisions. A negative result may occur with  improper specimen collection/handling, submission of specimen other than nasopharyngeal swab, presence of viral mutation(s) within the areas targeted by this assay, and inadequate number of viral  copies (<131 copies/mL). A negative result must be combined with clinical observations, patient history, and epidemiological information. The expected result is Negative.  Fact Sheet for Patients:  PinkCheek.be  Fact Sheet for Healthcare Providers:  GravelBags.it  This test is no t yet approved or cleared by the [place] FDA and  has been authorized for detection and/or diagnosis of SARS-CoV-2 by FDA under an Emergency Use Authorization (EUA). This EUA will remain  in effect (meaning this test can be used) for the duration of the COVID-19 declaration under Section 564(b)(1) of the Act, 21 U.S.C. section 360bbb-3(b)(1), unless the authorization is terminated or revoked sooner.     Influenza A by PCR NEGATIVE NEGATIVE Final   Influenza B by PCR NEGATIVE NEGATIVE Final    Comment: (NOTE) The Xpert Xpress SARS-CoV-2/FLU/RSV assay is intended as an aid in  the diagnosis of influenza from Nasopharyngeal swab specimens and  should not be used as a sole basis for treatment. Nasal washings and  aspirates are unacceptable for Xpert Xpress SARS-CoV-2/FLU/RSV  testing.  Fact Sheet for Patients: PinkCheek.be  Fact Sheet for Healthcare Providers: GravelBags.it  This test is not yet approved or cleared by the [place] FDA and  has been authorized for detection and/or diagnosis of SARS-CoV-2 by  FDA  under an Emergency Use Authorization (EUA). This EUA will remain  in effect (meaning this test can be used) for the duration of the  Covid-19 declaration under Section 564(b)(1) of the Act, 21  U.S.C. section 360bbb-3(b)(1), unless the authorization is  terminated or revoked. Performed at [facility], [place] [address] [place], [place] [address]     Radiology Reports NM Pulmonary Perfusion  Result Date: 10/11/2020 CLINICAL DATA:  Shortness of breath and chest pain EXAM: NUCLEAR MEDICINE PERFUSION LUNG SCAN TECHNIQUE: Perfusion images were obtained in multiple projections after intravenous injection of radiopharmaceutical. Views: Anterior, posterior, RPO, LPO, RAO, LAO RADIOPHARMACEUTICALS:  4.2 mCi Tc-66m MAA IV COMPARISON:  Chest radiograph October 10, 2020 FINDINGS: Distribution of radiotracer uptake is homogeneous and symmetric bilaterally. No appreciable perfusion defects. IMPRESSION: No appreciable perfusion defects. No findings indicative of pulmonary embolus. Electronically Signed   By: Lowella Grip III M.D.   On: 10/11/2020 11:07   VAS [place] IVC/ILIAC (VENOUS ONLY)  Result Date: 10/29/2020 IVC/ILIAC STUDY Indications: Chronic Leg DVT Risk Factors: Hypertension.  Comparison Study: None Performing Technologist: Griffin Basil RVT, RDMS  Examination Guidelines: A complete evaluation includes B-mode imaging, spectral Doppler, color Doppler, and power Doppler as needed of all accessible portions of each vessel. Bilateral testing is considered an integral part of a complete examination. Limited examinations for reoccurring indications may be performed as noted.  IVC/Iliac Findings: +----------+------+--------+--------+    IVC    PatentThrombusComments +----------+------+--------+--------+ IVC Prox  patent                 +----------+------+--------+--------+ IVC Mid         chronic          +----------+------+--------+--------+ IVC Distalpatent                  +----------+------+--------+--------+  +-------------------+---------+-----------+---------+-----------+--------+         CIV        RT-PatentRT-ThrombusLT-PatentLT-ThrombusComments +-------------------+---------+-----------+---------+-----------+--------+ Common Iliac Prox   patent              patent                      +-------------------+---------+-----------+---------+-----------+--------+ Common Iliac Mid  patent              patent                      +-------------------+---------+-----------+---------+-----------+--------+ Common Iliac Distal patent              patent                      +-------------------+---------+-----------+---------+-----------+--------+    Summary: IVC/Iliac: Mid IVC Thrombus , Hyperechoic non occlusive.  *See table(s) above for measurements and observations.  Electronically signed by Monica Martinez MD on 10/29/2020 at 3:24:42 PM.   Final    DG Chest Portable 1 View  Result Date: 10/28/2020 CLINICAL DATA:  Shortness of breath EXAM: PORTABLE CHEST 1 VIEW COMPARISON:  10/10/2020 FINDINGS: The heart size and mediastinal contours are within normal limits. Both lungs are clear. The visualized skeletal structures are unremarkable. The patient is status post prior median sternotomy. Aortic calcifications are noted. There are calcified bilateral breast implants. There is pleuroparenchymal scarring at the lung apices. There are end-stage degenerative changes of the left glenohumeral joint. The patient is status post prior total shoulder arthroplasty on the right. IMPRESSION: No active disease. Electronically Signed   By: Constance Holster M.D.   On: 10/28/2020 01:49   DG CHEST PORT 1 VIEW  Result Date: 10/10/2020 CLINICAL DATA:  Dyspnea. EXAM: PORTABLE CHEST 1 VIEW COMPARISON:  October 09, 2020. FINDINGS: Stable cardiomediastinal silhouette. Status post coronary bypass graft. No pneumothorax or pleural effusion is noted. No acute pulmonary  disease is noted. Status post right shoulder arthroplasty. IMPRESSION: No active disease. Aortic Atherosclerosis (ICD10-I70.0). Electronically Signed   By: Marijo Conception M.D.   On: 10/10/2020 10:13   DG Chest Port 1 View  Result Date: 10/09/2020 CLINICAL DATA:  Shortness of breath, cough and fever. EXAM: PORTABLE CHEST 1 VIEW COMPARISON:  10/05/2019 FINDINGS: Previous median sternotomy and CABG. Mild cardiomegaly. Aortic atherosclerosis. Bilateral breast implants with density overlying the chest. Suspicion of bronchitis and hazy pneumonia in the mid and lower lungs. No dense consolidation, lobar collapse or effusion. Old shoulder replacement on the right. Chronic degenerative changes of the left shoulder. IMPRESSION: 1. Suspicion of bronchitis and hazy pneumonia in the mid and lower lungs. No dense consolidation or lobar collapse. 2. Previous CABG. Mild cardiomegaly. Aortic atherosclerosis. Electronically Signed   By: Nelson Chimes M.D.   On: 10/09/2020 13:15   ECHOCARDIOGRAM COMPLETE  Result Date: 10/09/2020    ECHOCARDIOGRAM REPORT   Patient Name:   MABRY SANTARELLI Date of Exam: 10/09/2020 Medical Rec #:  [id number]      Height:       68.0 in Accession #:    [id number]     Weight:       167.5 lb Date of Birth:  1941/04/11       BSA:          1.896 m Patient Age:    51 years       BP:           92/42 mmHg Patient Gender: F              HR:           88 bpm. Exam Location:  Inpatient Procedure: Cardiac Doppler, Color Doppler, 2D Echo and 3D Echo STAT ECHO Indications:    R07.9* Chest pain, unspecified  History:        Patient has prior history of Echocardiogram  examinations, most                 recent 10/05/2019. CAD, Abnormal ECG and Prior CABG, COPD,                 Signs/Symptoms:Dyspnea; Risk Factors:Sleep Apnea and                 Dyslipidemia. Hypoxia. Pulmonary embolus.  Sonographer:    Roseanna Rainbow RDCS Referring Phys: [id number] [place]  1. Diastology suggestive of impaired LV  filling. Left ventricular ejection fraction, by estimation, is 60 to 65%. The left ventricle has normal function. The left ventricle has no regional wall motion abnormalities. There is moderate concentric left ventricular hypertrophy. Left ventricular diastolic parameters are indeterminate.  2. Right ventricular systolic function is low normal. The right ventricular size is normal. There is normal pulmonary artery systolic pressure.  3. Left atrial size was moderately dilated.  4. Right atrial size was moderately dilated.  5. The mitral valve is myxomatous. No evidence of mitral valve regurgitation. The mean mitral valve gradient is 5.0 mmHg with average heart rate of 87 bpm. Moderate mitral annular calcification.  6. The aortic valve is tricuspid. Aortic valve regurgitation is not visualized.  7. The inferior vena cava is dilated in size with <50% respiratory variability, suggesting right atrial pressure of 15 mmHg. Comparison(s): A prior study was performed on 10/05/2019. No significant change from prior study. Subtle decrease in RV function. FINDINGS  Left Ventricle: Diastology suggestive of impaired LV filling. Left ventricular ejection fraction, by estimation, is 60 to 65%. The left ventricle has normal function. The left ventricle has no regional wall motion abnormalities. The left ventricular internal cavity size was normal in size. There is moderate concentric left ventricular hypertrophy. Left ventricular diastolic parameters are indeterminate. Right Ventricle: The right ventricular size is normal. Right vetricular wall thickness was not well visualized. Right ventricular systolic function is low normal. There is normal pulmonary artery systolic pressure. The tricuspid regurgitant velocity is 2.30 m/s, and with an assumed right atrial pressure of 3 mmHg, the estimated right ventricular systolic pressure is 03.4 mmHg. Left Atrium: Left atrial size was moderately dilated. Right Atrium: Right atrial size was  moderately dilated. Pericardium: There is no evidence of pericardial effusion. Mitral Valve: The mitral valve is myxomatous. There is mild calcification of the mitral valve leaflet(s). Moderate mitral annular calcification. No evidence of mitral valve regurgitation. MV peak gradient, 25.4 mmHg. The mean mitral valve gradient is 5.0  mmHg with average heart rate of 87 bpm. Tricuspid Valve: The tricuspid valve is grossly normal. Tricuspid valve regurgitation is trivial. Aortic Valve: The aortic valve is tricuspid. Aortic valve regurgitation is not visualized. Pulmonic Valve: The pulmonic valve was not well visualized. Pulmonic valve regurgitation is trivial. Aorta: The aortic root and ascending aorta are structurally normal, with no evidence of dilitation. Venous: The inferior vena cava is dilated in size with less than 50% respiratory variability, suggesting right atrial pressure of 15 mmHg. IAS/Shunts: The interatrial septum appears to be lipomatous. The atrial septum is grossly normal.  LEFT VENTRICLE PLAX 2D LVIDd:         4.60 cm     Diastology LVIDs:         3.00 cm     LV e' medial:    5.87 cm/s LV PW:         1.40 cm     LV E/e' medial:  27.1 LV IVS:  1.40 cm     LV e' lateral:   8.21 cm/s LVOT diam:     1.70 cm     LV E/e' lateral: 19.4 LV SV:         62 LV SV Index:   33 LVOT Area:     2.27 cm  LV Volumes (MOD) LV vol d, MOD A2C: 99.9 ml LV vol d, MOD A4C: 71.4 ml LV vol s, MOD A2C: 35.9 ml LV vol s, MOD A4C: 26.8 ml LV SV MOD A2C:     64.0 ml LV SV MOD A4C:     71.4 ml LV SV MOD BP:      56.6 ml RIGHT VENTRICLE            IVC RV S prime:     8.08 cm/s  IVC diam: 3.00 cm TAPSE (M-mode): 1.4 cm LEFT ATRIUM             Index       RIGHT ATRIUM           Index LA diam:        3.80 cm 2.00 cm/m  RA Area:     17.30 cm LA Vol (A2C):   56.3 ml 29.70 ml/m RA Volume:   44.90 ml  23.69 ml/m LA Vol (A4C):   44.9 ml 23.69 ml/m LA Biplane Vol: 55.6 ml 29.33 ml/m  AORTIC VALVE LVOT Vmax:   129.00 cm/s LVOT  Vmean:  86.900 cm/s LVOT VTI:    0.272 m  AORTA Ao Root diam: 3.40 cm Ao Asc diam:  3.40 cm MITRAL VALVE                TRICUSPID VALVE MV Area (PHT): 4.15 cm     TR Peak grad:   21.2 mmHg MV Peak grad:  25.4 mmHg    TR Vmax:        230.00 cm/s MV Mean grad:  5.0 mmHg MV Vmax:       2.52 m/s     SHUNTS MV Vmean:      88.8 cm/s    Systemic VTI:  0.27 m MV Decel Time: 183 msec     Systemic Diam: 1.70 cm MV E velocity: 159.00 cm/s MV A velocity: 209.00 cm/s MV E/A ratio:  0.76 Rudean Haskell MD Electronically signed by Rudean Haskell MD Signature Date/Time: 10/09/2020/4:38:55 PM    Final    CT VENOGRAM ABD/PEL  Result Date: 10/30/2020 CLINICAL DATA:  History of DVT with IVC filter placement. Evaluate for central/caval thrombosis. EXAM: CT ABDOMEN AND PELVIS WITH CONTRAST TECHNIQUE: Multidetector CT imaging of the abdomen and pelvis was performed using the standard protocol following bolus administration of intravenous contrast. CONTRAST:  132mL OMNIPAQUE IOHEXOL 350 MG/ML SOLN COMPARISON:  CT abdomen pelvis-04/01/2018 nuclear medicine perfusion scan-10/11/2020 FINDINGS: Lower chest: Limited visualization of the lower thorax demonstrates chronic small right and trace left-sided effusions, similar to the 2019 examination. Improved aeration of the right lung base with persistent bibasilar consolidative opacities, likely atelectasis. Cardiomegaly. Exuberant calcifications about the mitral valve annulus. Coronary artery calcifications. No pericardial effusion. Hepatobiliary: There is mild diffuse decreased attenuation hepatic parenchyma, nonspecific though could be seen in the setting previous amiodarone therapy. No discrete hepatic lesions. Post cholecystectomy. There is mild prominence of the extra and intrahepatic biliary system presumably secondary to biliary reservoir phenomena. No ascites. Pancreas: Pancreas appears largely atrophic. Spleen: Normal appearance of the spleen. Adrenals/Urinary Tract:  There is symmetric enhancement and excretion of the bilateral kidneys.  Note is made of an approximately 2.6 cm hypoattenuating cyst involving the inferior medial aspect of the right kidney. Left-sided hypoattenuating renal lesions are too small to accurately characterize though favored to represent additional renal cysts. No evidence of nephrolithiasis on this postcontrast examination. No urinary obstruction or perinephric stranding. There is mild thickening of the crux of the left adrenal gland without discrete nodule. Normal appearance of the right adrenal gland. Suspected mild thickening of the urinary bladder wall, potentially accentuated due to underdistention and suboptimally evaluated due to streak artifact from the patient's bilateral total hip prostheses. Stomach/Bowel: Moderate colonic stool burden without evidence of enteric obstruction. Normal appearance of the terminal ileum. The appendix is not visualized, however there is no pericecal inflammatory change. No evidence of enteric obstruction. No pneumoperitoneum, pneumatosis or portal venous gas. Vascular/Lymphatic: There is a large amount of predominantly calcified slightly irregular atherosclerotic plaque throughout the normal caliber abdominal aorta. Atherosclerotic plaque likely approaches 50% luminal narrowing involving the infrarenal abdominal aorta (axial image 24, series 3; sagittal image 83, series 4). The patient has undergone stenting involving the origin and proximal aspect of the SMA, the patency of which is suboptimally evaluated due to phase of enhancement as well as significant streak artifact from the patient's lumbar spine hardware. Post IVC filter placement. There may be a small amount of nonocclusive thrombus within the conical portion of the IVC filter (axial image 31, series 3; coronal image 81, series 4) however this may be artifactual due to significant adjacent streak artifact. Otherwise, the IVC and pelvic venous systems appear  widely patent. Evaluation of the bilateral common femoral veins is degraded secondary to significant streak artifact from the patient's bilateral total hip prostheses. Redemonstrated mildly prominent bilateral inguinal lymph nodes with index left inguinal lymph node measuring 1.1 cm in greatest short axis diameter (image 71, series 3), similar to the 2019 examination and presumably reactive in etiology. No bulky retroperitoneal, mesenteric, pelvic or inguinal lymphadenopathy Reproductive: Evaluation degraded secondary streak artifact from the patient's bilateral total hip prostheses. Other: Redemonstrated peripherally calcified breast prostheses. A small amount of fluid is noted within a tiny midline ventral wall hernia which measures approximately 3.6 x 2.6 cm (sagittal image 90, series 5). Diffuse body wall anasarca. Musculoskeletal: Stable sequela of L1-L3 paraspinal fusion with L4 laminectomy and intervertebral disc space replacement of L1-L2, L2-L3, L3-L4, L4-L5. Fluid is again noted posterior to the spinal operative site. 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 Interval left total hip replacement. IMPRESSION: 1. Post IVC filter placement with possible small amount of nonocclusive thrombus within the conical portion of the IVC filter however this may be artifactual due to significant adjacent streak artifact from the patient's long segment paraspinal fusion hardware. Regardless, this is of doubtful clinical concern as the IVC and pelvic venous systems appear otherwise widely patent. 2. Moderate colonic stool burden without evidence of enteric obstruction. 3. Cardiomegaly with small right and trace left-sided effusions, similar to the 2019 examination. 4. Improved aeration of the right lung base with persistent bibasilar consolidative opacities, likely atelectasis/scar. 5. Coronary calcifications.  Aortic Atherosclerosis (ICD10-I70.0). 6. Significant atherosclerotic plaque throughout the abdominal aorta  including potential 50% luminal narrowing involving the infrarenal abdominal aorta and the sequela of previous stenting of the SMA, incompletely evaluated due to phase of contrast enhancement as well as significant streak artifact from the patient's long segment paraspinal fusion hardware. Electronically Signed   By: Sandi Mariscal M.D.   On: 10/30/2020 15:40   VAS [place] LOWER EXTREMITY  VENOUS (DVT)  Result Date: 10/10/2020  Lower Venous DVTStudy Indications: Elevated ddimer.  Comparison Study: previous 11/03/18 Performing Technologist: Abram Sander RVS  Examination Guidelines: A complete evaluation includes B-mode imaging, spectral Doppler, color Doppler, and power Doppler as needed of all accessible portions of each vessel. Bilateral testing is considered an integral part of a complete examination. Limited examinations for reoccurring indications may be performed as noted. The reflux portion of the exam is performed with the patient in reverse Trendelenburg.  +---------+---------------+---------+-----------+----------+--------------+ RIGHT    CompressibilityPhasicitySpontaneityPropertiesThrombus Aging +---------+---------------+---------+-----------+----------+--------------+ CFV      Full           Yes      Yes                                 +---------+---------------+---------+-----------+----------+--------------+ SFJ      Full                                                        +---------+---------------+---------+-----------+----------+--------------+ FV Prox  Full                                                        +---------+---------------+---------+-----------+----------+--------------+ FV Mid   Full                                                        +---------+---------------+---------+-----------+----------+--------------+ FV DistalFull                                                         +---------+---------------+---------+-----------+----------+--------------+ PFV      Full                                                        +---------+---------------+---------+-----------+----------+--------------+ POP      None           Yes      Yes                  Chronic        +---------+---------------+---------+-----------+----------+--------------+ PTV      Full                                                        +---------+---------------+---------+-----------+----------+--------------+ PERO     Full                                                        +---------+---------------+---------+-----------+----------+--------------+   +---------+---------------+---------+-----------+----------+--------------+  LEFT     CompressibilityPhasicitySpontaneityPropertiesThrombus Aging +---------+---------------+---------+-----------+----------+--------------+ CFV      Full           Yes      Yes                                 +---------+---------------+---------+-----------+----------+--------------+ SFJ      Full                                                        +---------+---------------+---------+-----------+----------+--------------+ FV Prox  Full                                                        +---------+---------------+---------+-----------+----------+--------------+ FV Mid   Full                                                        +---------+---------------+---------+-----------+----------+--------------+ FV DistalFull                                                        +---------+---------------+---------+-----------+----------+--------------+ PFV      Full                                                        +---------+---------------+---------+-----------+----------+--------------+ POP      Full           Yes      Yes                                  +---------+---------------+---------+-----------+----------+--------------+ PTV      Full                                                        +---------+---------------+---------+-----------+----------+--------------+ PERO     Full                                                        +---------+---------------+---------+-----------+----------+--------------+     Summary: RIGHT: - Findings consistent with chronic deep vein thrombosis involving the right popliteal vein. - No cystic structure found in the popliteal fossa.  LEFT: - There is no evidence of deep vein thrombosis in the lower extremity.  - No cystic  structure found in the popliteal fossa.  *See table(s) above for measurements and observations. Electronically signed by Harold Barban MD on 10/10/2020 at 7:40:41 PM.    Final    VAS [place] UPPER EXTREMITY VENOUS DUPLEX  Result Date: 10/10/2020 UPPER VENOUS STUDY  Indications: elevated ddimer Comparison Study: no prior Performing Technologist: Abram Sander RVS  Examination Guidelines: A complete evaluation includes B-mode imaging, spectral Doppler, color Doppler, and power Doppler as needed of all accessible portions of each vessel. Bilateral testing is considered an integral part of a complete examination. Limited examinations for reoccurring indications may be performed as noted.  Right Findings: +----------+------------+---------+-----------+----------+-------+ RIGHT     CompressiblePhasicitySpontaneousPropertiesSummary +----------+------------+---------+-----------+----------+-------+ IJV           Full       Yes       Yes                      +----------+------------+---------+-----------+----------+-------+ Subclavian    Full       Yes       Yes                      +----------+------------+---------+-----------+----------+-------+ Axillary      Full       Yes       Yes                      +----------+------------+---------+-----------+----------+-------+  Brachial      Full       Yes       Yes                      +----------+------------+---------+-----------+----------+-------+ Radial        Full                                          +----------+------------+---------+-----------+----------+-------+ Ulnar         Full                                          +----------+------------+---------+-----------+----------+-------+ Cephalic      Full                                          +----------+------------+---------+-----------+----------+-------+ Basilic       Full                                          +----------+------------+---------+-----------+----------+-------+  Left Findings: +----------+------------+---------+-----------+----------+-------+ LEFT      CompressiblePhasicitySpontaneousPropertiesSummary +----------+------------+---------+-----------+----------+-------+ IJV           Full       Yes       Yes                      +----------+------------+---------+-----------+----------+-------+ Subclavian    Full       Yes       Yes                      +----------+------------+---------+-----------+----------+-------+ Axillary  Full       Yes       Yes                      +----------+------------+---------+-----------+----------+-------+ Brachial      Full       Yes       Yes                      +----------+------------+---------+-----------+----------+-------+ Radial        Full                                          +----------+------------+---------+-----------+----------+-------+ Ulnar         Full                                          +----------+------------+---------+-----------+----------+-------+ Cephalic      Full                                          +----------+------------+---------+-----------+----------+-------+ Basilic       Full                                           +----------+------------+---------+-----------+----------+-------+  Summary: No evidence of deep vein or superficial vein thrombosis involving the right and left upper extremities.  *See table(s) above for measurements and observations.  Diagnosing physician: Harold Barban MD Electronically signed by Harold Barban MD on 10/10/2020 at 7:40:28 PM.    Final

## 2020-10-31 NOTE — Progress Notes (Addendum)
  Progress Note    10/31/2020 7:59 AM * No surgery found *  Subjective: no complaints. States legs are feeling better and swelling is better   Vitals:   10/31/20 0017 10/31/20 0436  BP: 107/62 (!) 120/58  Pulse: 99 81  Resp: 18 20  Temp: 98.6 F (37 C) 97.8 F (36.6 C)  SpO2: 97% 100%   Physical Exam: Cardiac: regular Lungs: non labored Extremities:  Bilateral lower extremities edematous. Warm and well perfused. Motor and sensation intact Neurologic: alert and oriented  CBC    Component Value Date/Time   WBC 4.1 10/31/2020 0059   RBC 2.97 (L) 10/31/2020 0059   HGB 8.7 (L) 10/31/2020 0059   HGB 12.6 09/10/2020 1535   HCT 28.5 (L) 10/31/2020 0059   HCT 38.8 09/10/2020 1535   PLT 175 10/31/2020 0059   PLT 245 09/10/2020 1535   MCV 96.0 10/31/2020 0059   MCV 90 09/10/2020 1535   MCH 29.3 10/31/2020 0059   MCHC 30.5 10/31/2020 0059   RDW 15.5 10/31/2020 0059   RDW 15.8 (H) 09/10/2020 1535   LYMPHSABS 1.7 10/13/2020 0444   MONOABS 1.0 10/13/2020 0444   EOSABS 0.0 10/13/2020 0444   BASOSABS 0.1 10/13/2020 0444    BMET    Component Value Date/Time   NA 141 10/31/2020 0059   NA 142 09/10/2020 1535   K 3.7 10/31/2020 0059   CL 105 10/31/2020 0059   CO2 26 10/31/2020 0059   GLUCOSE 179 (H) 10/31/2020 0059   BUN 11 10/31/2020 0059   BUN 28 (H) 09/10/2020 1535   CREATININE 0.97 10/31/2020 0059   CALCIUM 8.7 (L) 10/31/2020 0059   GFRNONAA 59 (L) 10/31/2020 0059   GFRAA 50 (L) 09/10/2020 1535    INR    Component Value Date/Time   INR 1.0 01/31/2020 1500     Intake/Output Summary (Last 24 hours) at 10/31/2020 0759 Last data filed at 10/31/2020 0600 Gross per 24 hour  Intake 2066.32 ml  Output 0 ml  Net 2066.32 ml     Assessment/Plan:  79 y.o. female with bilateral lower extremity swelling who has IVC filter that was placed in 2019. Patients bilateral edema is stable and her legs are feeling better. Duplex showed thrombus in IVC filter with concerns  for occlusion and need for removal of filter. CT Venogram showing possible amount of non occlusive thrombus in the filter otherwise the venous system is patent.Will defer to Dr. Donzetta Matters as far as reviewing CT Venogram imaging to determine necessity of removal and timing of this. I have encouraged her to continue to elevate her legs while in bed  DVT prophylaxis:  Sq heparin   Karoline Caldwell, PA-C [facility] [phone] 10/31/2020 7:59 AM   I have independently interviewed and examined patient and agree with PA assessment and plan above.  Edema somewhat improved bilateral lower extremities.  I discussed with her the findings of her CT venogram would not recommend filter removal.  I discussed with her need to elevate her legs and possible compression stockings although she does not think she can get tight compression on without significant assistance.  We will start with TED hose.  She should still elevate her legs when possible.  I will schedule follow-up for her in our office with her mesenteric duplex as she has not been evaluated since 2019.  Marcelus Dubberly C. Donzetta Matters, MD [facility] [place] Office: [phone] Pager: [phone]

## 2020-10-31 NOTE — Progress Notes (Signed)
Physical Therapy Treatment Patient Details Name: [name] MRN: [id number] DOB: October 05, 1941 Today's Date: 10/31/2020    History of Present Illness 79yo female  with recent admit for sepsis due to pneumonia now c/o low BP, lightheadedness and weakness, heart palpitations and DOE. Admitted with AKI. PMH anxiety, arthralgia, CAD, CHF, LBP, COPD, hx DVT s/p IVC filter placement, dysrhythmia, exertional angina, gout, ankle surgery, back surgery, CABG, B TKR, B shoulder arthroplasty    PT Comments    Patient received in recliner with daughter present, very pleasant and willing to participate in therapy. Able to maintain SpO2 >98% on 4LPM but still has mild SOB with exertion, did need cues for breathing through her nose instead of her mouth during session. Tolerated progression of gait distance but fatigued- safe and steady with RW, educated on benefits of home use as well as role of anchoring on RW to improve respiratory muscle function, also practiced ankle pumps to help reduce BLE edema. Also educated on benefits of skilled OP PT instead of HHPT in terms of more advanced strength, balance, and functional activity tolerance gains. Left up in recliner with all needs met, family present. Making good progress.    Follow Up Recommendations  Outpatient PT;Supervision for mobility/OOB     Equipment Recommendations  None recommended by PT (well equipped)    Recommendations for Other Services       Precautions / Restrictions Precautions Precautions: Fall Precaution Comments: chronic 4L O2 [place] Restrictions Weight Bearing Restrictions: No    Mobility  Bed Mobility               General bed mobility comments: OOB in recliner  Transfers Overall transfer level: Needs assistance Equipment used: Rolling walker (2 wheeled) Transfers: Sit to/from Stand Sit to Stand: Supervision         General transfer comment: S for transfers with RW, great hand placement and safety  awareness  Ambulation/Gait Ambulation/Gait assistance: Supervision Gait Distance (Feet): 40 Feet Assistive device: Rolling walker (2 wheeled) Gait Pattern/deviations: Step-through pattern;Trunk flexed     General Gait Details: slow and steady with RW, able to mobilize with S but did need cues for appropriate distance from RW as she tends to push it too far forward; SpO2 98% and above on 4LPM   Stairs             Wheelchair Mobility    Modified Rankin (Stroke Patients Only)       Balance Overall balance assessment: Needs assistance Sitting-balance support: No upper extremity supported;Feet unsupported Sitting balance-Leahy Scale: Good     Standing balance support: Bilateral upper extremity supported;During functional activity Standing balance-Leahy Scale: Fair Standing balance comment: steady with BUE support                            Cognition Arousal/Alertness: Awake/alert Behavior During Therapy: WFL for tasks assessed/performed Overall Cognitive Status: Within Functional Limits for tasks assessed                                        Exercises      General Comments General comments (skin integrity, edema, etc.): SpO2 98% and over on 4LPM      Pertinent Vitals/Pain Pain Assessment: No/denies pain Pain Score: 0-No pain Pain Intervention(s): Limited activity within patient's tolerance;Monitored during session    Home Living  Prior Function            PT Goals (current goals can now be found in the care plan section) Acute Rehab PT Goals Patient Stated Goal: do what she needs to do to get better PT Goal Formulation: With patient Time For Goal Achievement: 11/13/20 Potential to Achieve Goals: Good Progress towards PT goals: Progressing toward goals    Frequency    Min 3X/week      PT Plan Current plan remains appropriate    Co-evaluation              AM-PAC PT "6 Clicks"  Mobility   Outcome Measure  Help needed turning from your back to your side while in a flat bed without using bedrails?: A Little Help needed moving from lying on your back to sitting on the side of a flat bed without using bedrails?: A Little Help needed moving to and from a bed to a chair (including a wheelchair)?: A Little Help needed standing up from a chair using your arms (e.g., wheelchair or bedside chair)?: A Little Help needed to walk in hospital room?: A Little Help needed climbing 3-5 steps with a railing? : A Little 6 Click Score: 18    End of Session Equipment Utilized During Treatment: Oxygen Activity Tolerance: Patient tolerated treatment well Patient left: in chair;with call bell/phone within reach Nurse Communication: Mobility status PT Visit Diagnosis: Muscle weakness (generalized) (M62.81)     Time: 1068-1661 PT Time Calculation (min) (ACUTE ONLY): 23 min  Charges:  $Gait Training: 8-22 mins $Therapeutic Activity: 8-22 mins                     Windell Norfolk, DPT, PN1   Supplemental Physical Therapist [place]    Pager [phone] Acute Rehab Office [phone]

## 2020-11-01 LAB — BASIC METABOLIC PANEL
Anion gap: 5 (ref 5–15)
BUN: 14 mg/dL (ref 8–23)
CO2: 32 mmol/L (ref 22–32)
Calcium: 8.9 mg/dL (ref 8.9–10.3)
Chloride: 104 mmol/L (ref 98–111)
Creatinine, Ser: 1.05 mg/dL — ABNORMAL HIGH (ref 0.44–1.00)
GFR, Estimated: 54 mL/min — ABNORMAL LOW (ref >60)
Glucose, Bld: 115 mg/dL — ABNORMAL HIGH (ref 70–99)
Potassium: 4.3 mmol/L (ref 3.5–5.1)
Sodium: 141 mmol/L (ref 135–145)

## 2020-11-01 LAB — CBC
HCT: 28.1 % — ABNORMAL LOW (ref 36.0–46.0)
Hemoglobin: 8.6 g/dL — ABNORMAL LOW (ref 12.0–15.0)
MCH: 29 pg (ref 26.0–34.0)
MCHC: 30.6 g/dL (ref 30.0–36.0)
MCV: 94.6 fL (ref 80.0–100.0)
Platelets: 189 10*3/uL (ref 150–400)
RBC: 2.97 MIL/uL — ABNORMAL LOW (ref 3.87–5.11)
RDW: 15.7 % — ABNORMAL HIGH (ref 11.5–15.5)
WBC: 7.8 10*3/uL (ref 4.0–10.5)
nRBC: 0 % (ref 0.0–0.2)

## 2020-11-01 MED ORDER — OXYCODONE HCL 5 MG PO TABS
5.0000 mg | ORAL_TABLET | Freq: Three times a day (TID) | ORAL | Status: DC | PRN
  Administered 2020-11-01 (×2): 5 mg via ORAL
  Filled 2020-11-01 (×2): qty 1

## 2020-11-01 MED ORDER — FUROSEMIDE 10 MG/ML IJ SOLN
20.0000 mg | Freq: Two times a day (BID) | INTRAMUSCULAR | Status: AC
  Administered 2020-11-01 (×2): 20 mg via INTRAVENOUS
  Filled 2020-11-01 (×2): qty 2

## 2020-11-01 MED ORDER — POTASSIUM CHLORIDE CRYS ER 20 MEQ PO TBCR
20.0000 meq | EXTENDED_RELEASE_TABLET | Freq: Two times a day (BID) | ORAL | Status: AC
  Administered 2020-11-01 (×2): 20 meq via ORAL
  Filled 2020-11-01 (×2): qty 1

## 2020-11-01 NOTE — TOC Initial Note (Signed)
Transition of Care [facility]) - Initial/Assessment Note    Patient Details  Name: [name] MRN: [id number] Date of Birth: 1941/04/10  [facility] CM/SW Contact:    Bartholomew Crews, RN Phone Number: [phone] 11/01/2020, 10:56 AM  Clinical Narrative:                  Spoke with patient at the bedside. Her sister was visiting. Patient consented to sister being present during conversation. PTA home alone. States that she has oxygen at 4L through [employer] Patient, and has a travel tank. She also has a walker, 3N1, wheelchair, and scooter. At discharge she will be staying with her son, [name], at his home ([place]). She stated that her granddaughter will provide transportation home from the hospital.   Discussed recommendations for outpatient PT. Patient in agreement. Discussed choice of locations in [place]. Patient chose [facility] location on [address]. She stated that her granddaughter will provide transportation to her appointments. Will fax referral.   Verified PCP (Dr. [name]) and preferred pharmacy [facility]) in [place] as correct. Spoke with PCP office, patient already has an appointment scheduled for Monday 11/15.   TOC following for transition needs.    Expected Discharge Plan: OP Rehab Barriers to Discharge: Continued Medical Work up   Patient Goals and CMS Choice Patient states their goals for this hospitalization and ongoing recovery are:: will return home with her son CMS Medicare.gov Compare Post Acute Care list provided to:: Patient Choice offered to / list presented to : Patient  Expected Discharge Plan and Services Expected Discharge Plan: OP Rehab In-house Referral: NA Discharge Planning Services: CM Consult Post Acute Care Choice: NA Living arrangements for the past 2 months: Single Family Home                 DME Arranged: N/A DME Agency: NA       HH Arranged: NA HH Agency: NA         Prior Living Arrangements/Services Living arrangements for the past 2 months: Single Family Home Lives with:: Self Patient language and need for interpreter reviewed:: Yes Do you feel safe going back to the place where you live?: Yes      Need for Family Participation in Patient Care: Yes (Comment) Care giver support system in place?: Yes (comment) Current home services: DME (oxygen at 4L [place] Patient; has walker, 3N1, wc, scooter) Criminal Activity/Legal Involvement Pertinent to Current Situation/Hospitalization: No - Comment as needed  Activities of Daily Living Home Assistive Devices/Equipment: Eyeglasses, Dentures (specify type), Cane (specify quad or straight), Walker (specify type) ADL Screening (condition at time of admission) Patient's cognitive ability adequate to safely complete daily activities?: Yes Is the patient deaf or have difficulty hearing?: No Does the patient have difficulty seeing, even when wearing glasses/contacts?: No Does the patient have difficulty concentrating, remembering, or making decisions?: Yes Patient able to express need for assistance with ADLs?: Yes Does the patient have difficulty dressing or bathing?: No Independently performs ADLs?: Yes (appropriate for developmental age) Does the patient have difficulty walking or climbing stairs?: Yes Weakness of Legs: Both Weakness of Arms/Hands: Both  Permission Sought/Granted Permission sought to share information with : Family Supports                Emotional Assessment Appearance:: Appears stated age Attitude/Demeanor/Rapport: Engaged Affect (typically observed): Accepting Orientation: : Oriented to Self, Oriented to  Time, Oriented to Place,  Oriented to Situation Alcohol / Substance Use: Not Applicable Psych Involvement: No (comment)  Admission diagnosis:  Dehydration [E86.0] AKI (acute kidney injury) ([place]) [N17.9] Patient Active Problem List   Diagnosis Date Noted  .  Normocytic anemia 10/28/2020  . Acute respiratory failure with hypoxia ([place]) 10/09/2020  . Hypotension 10/09/2020  . CAP (community acquired pneumonia) 10/09/2020  . L Subclavian Artery Stenosis   . S/P hip replacement, left 02/07/2020  . History of pulmonary embolism 10/04/2019  . Dyspnea 12/24/2018  . Cellulitis 12/24/2018  . Otitis media 12/24/2018  . Diuretic-induced hypokalemia 10/08/2018  . Acute on chronic diastolic heart failure ([place]) 10/06/2018  . Hematuria 04/01/2018  . Acute lower UTI 04/01/2018  . Hematoma of abdominal wall, subsequent encounter 04/01/2018  . Chronic diastolic heart failure ([place]) 03/29/2018  . GERD without esophagitis 03/29/2018  . Chronic gout 03/29/2018  . Chronic bilateral low back pain without sciatica 03/29/2018  . Colitis 03/10/2018  . AKI (acute kidney injury) ([place])   . Ascites   . Pulmonary embolism ([place]) 03/01/2018  . 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 Acute on chronic respiratory failure with hypoxia ([place]) 03/01/2018  . Angiodysplasia of intestine   . Hematochezia   . Malnutrition of moderate degree 02/15/2017  . Blood loss anemia 02/13/2017  . Impingement syndrome of left shoulder 02/10/2013  . PAD (peripheral artery disease) ([place]) 11/17/2011  . Dyslipidemia 10/17/2011  . Coronary artery disease involving native coronary artery of native heart with angina pectoris ([place])   . Hypothyroidism 05/03/2008  . Essential hypertension 05/03/2008  . Sleep apnea 05/03/2008  . COPD (chronic obstructive pulmonary disease) ([place]) 04/12/2008   PCP:  [name], MD Pharmacy:   [place], [address] [place] [address] Phone: [phone] Fax: [place], [address] [place] [address] Phone: [phone] Fax: [phone]     Social Determinants of Health (SDOH) Interventions     Readmission Risk Interventions No flowsheet data found.

## 2020-11-01 NOTE — Progress Notes (Signed)
PROGRESS NOTE                                                                             PROGRESS NOTE                                                                                                                                                                                                             Patient Demographics:    [name], is a 79 y.o. female, DOB - 10/09/1941, XNA:[id number]  Outpatient Primary MD for the patient is [name], [name], MD    LOS - 2  Admit date - 10/28/2020    Chief Complaint  Patient presents with  . Hypotension       Brief Narrative     79 y.o. female with medical history significant of CAD, CABG, chronic diastolic CHF,  carotid artery disease, depression, anxiety, recurrent DVT and PEs with GI bleeds from anticoagulation, status post IVC filter placement in 2019, chronic lower back pain, COPD, paroxysmal atrial fibrillation after CABG, GERD, hyperlipidemia, hypertension, hypothyroidism, class I obesity, osteoarthritis, history of pneumonia, peripheral vascular disease, sleep apnea no longer on CPAP who was recently discharged for sepsis secondary to pneumonia now coming to the emergency department due to low blood pressure after she has been taking higher than usual furosemide dose of 80 mg in the morning and 40 mg in the evening. H/o being lightheaded and weak for the past few days.  -Her work-up was significant for orthostasis and AKI , this has resolved with IV hydration, she is having significant lower extremity edema felt to be secondary to venous stasis, IVC filter and fluid overload    Subjective:    [name] reports dyspnea today, also complains of worsening swelling   Assessment  & Plan :    AKI /hypotension -Most likely due to overdiuresis, volume depletion and hypotension, creatinine elevated 2.2 on admission,  -Improved with IV fluids, IV fluids discontinued -Getting Lasix for mild fluid  overload, creatinine stable, check BMP in a.m.  Subacute bilateral  lower extremity edema -I think this is multifactorial, secondary to venous stasis, fluid overload and IVC filter -Appreciate vascular input, CT venogram rules out complete occlusion of IVC -Compression stockings, additional IV Lasix today -Creatinine stable, BMP in a.m.  Acute on chronic diastolic CHF -IV Lasix as noted above    Hypokalemia -Repleted    Hypothyroidism Continue levothyroxine 50 mcg p.o. daily.    Essential hypertension -Stable, monitor    COPD (chronic obstructive pulmonary disease) (HCC) Chronic hypoxic respiratory failure on 4 L home O2 Continue daily Daliresp, Spiriva and Combivent. -Stable, no wheezing    Obstructive sleep apnea -Does not use CPAP anymore    Dyslipidemia Continue atorvastatin 20 mg p.o. daily    GERD without esophagitis Pantoprazole 40 mg p.o. daily.    History of pulmonary embolism and DVT. The patient has history of IVC placement.    Normocytic anemia Monitor H&H. Transfuse as needed.   DVT prophylaxis: Heparin subcutaneous  Family Communication  : Sister at bedside Code Status :  Full Disposition Plan  :    Status [id number]  Dispo: The patient is from: Home              Anticipated d/c is to: Home              Anticipated d/c date is: 11/12, tomorrow             Patient currently is not medically stable to d/c.  Marland Kitchen   Lab Results  Component Value Date   PLT 189 11/01/2020    Diet :  Diet Order            Diet Heart Room service appropriate? Yes; Fluid consistency: Thin  Diet effective now                  Inpatient Medications  Scheduled Meds: . allopurinol  100 mg Oral Daily  . ARIPiprazole  1 mg Oral Daily  . aspirin  81 mg Oral Daily  . atorvastatin  20 mg Oral Daily  . citalopram  20 mg Oral QHS  . ferrous sulfate  325 mg Oral Daily  . furosemide  20 mg Intravenous Q12H  . gabapentin  100 mg Oral Daily  . heparin  injection (subcutaneous)  5,000 Units Subcutaneous Q8H  . levothyroxine  50 mcg Oral Q0600  . magnesium oxide  400 mg Oral BID  . melatonin  10 mg Oral QHS  . ofloxacin  5 drop Left EAR Daily  . pantoprazole  40 mg Oral Daily  . pilocarpine  1 drop Both Eyes BID  . polyethylene glycol  17 g Oral Daily  . potassium chloride SA  20 mEq Oral BID  . pramipexole  0.25 mg Oral QPM  . roflumilast  500 mcg Oral QPM  . traZODone  150 mg Oral QHS  . umeclidinium bromide  1 puff Inhalation Daily   Continuous Infusions:  PRN Meds:.acetaminophen **OR** acetaminophen, Ipratropium-Albuterol, nitroGLYCERIN, oxyCODONE  Antibiotics  :    Anti-infectives (From admission, onward)   None      Domenic Polite M.D on 11/01/2020 at 11:50 AM  To page go to [email or web address]   [facility] -  Office  [phone]    Objective:   Vitals:   10/31/20 1713 10/31/20 2140 11/01/20 0624 11/01/20 0847  BP: (!) 129/52 109/60 129/64 114/78  Pulse: 86 84 92 79  Resp: 18 19  16   Temp: 99 F (37.2 C) 98.4 F (36.9 C)  98.7 F (37.1  C)  TempSrc: Oral   Oral  SpO2: 90% 96% 95% 100%  Weight:  82 kg    Height:        Wt Readings from Last 3 Encounters:  10/31/20 82 kg  10/13/20 77.8 kg  09/10/20 75.3 kg     Intake/Output Summary (Last 24 hours) at 11/01/2020 1150 Last data filed at 11/01/2020 0900 Gross per 24 hour  Intake 480 ml  Output 0 ml  Net 480 ml     Physical Exam Gen: Awake alert oriented x3, no distress CVS: S1-S2, regular rate rhythm Lungs: Few basilar rales, otherwise clear Abdomen: Soft, nontender, nondistended, bowel sounds present Extremities: 1+ edema, venous stasis changes  Skin: no new rashes   Data Review:    CBC Recent Labs  Lab 10/28/20 1138 10/29/20 0257 10/30/20 0249 10/31/20 0059 11/01/20 0103  WBC 8.3 6.5 6.0 4.1 7.8  HGB 9.9* 10.0* 9.8* 8.7* 8.6*  HCT 32.6* 32.9* 32.4* 28.5* 28.1*  PLT 227 182 179 175 189  MCV 95.3 96.2 96.4 96.0 94.6  MCH 28.9  29.2 29.2 29.3 29.0  MCHC 30.4 30.4 30.2 30.5 30.6  RDW 15.9* 16.0* 15.5 15.5 15.7*    Recent Labs  Lab 10/28/20 0108 10/28/20 0108 10/28/20 0659 10/28/20 1138 10/29/20 0257 10/30/20 0249 10/31/20 0059 11/01/20 0103  NA 138   < >  --  139 139 141 141 141  K 3.2*   < >  --  3.7 4.0 3.8 3.7 4.3  CL 93*   < >  --  97* 102 102 105 104  CO2 34*   < >  --  33* 29 32 26 32  GLUCOSE 76   < >  --  97 101* 98 179* 115*  BUN 24*   < >  --  22 20 12 11 14   CREATININE 2.20*   < >  --  1.60* 1.24* 0.81 0.97 1.05*  CALCIUM 8.7*   < >  --  8.6* 8.6* 8.9 8.7* 8.9  LATICACIDVEN  --   --  1.2  --   --   --   --   --   BNP 339.0*  --   --   --  425.5*  --   --   --    < > = values in this interval not displayed.    ------------------------------------------------------------------------------------------------------------------ No results for input(s): CHOL, HDL, LDLCALC, TRIG, CHOLHDL, LDLDIRECT in the last 72 hours.  No results found for: HGBA1C ------------------------------------------------------------------------------------------------------------------ No results for input(s): TSH, T4TOTAL, T3FREE, THYROIDAB in the last 72 hours.  Invalid input(s): FREET3  Cardiac Enzymes No results for input(s): CKMB, TROPONINI, MYOGLOBIN in the last 168 hours.  Invalid input(s): CK ------------------------------------------------------------------------------------------------------------------    Component Value Date/Time   BNP 425.5 (H) 10/29/2020 0257    Micro Results Recent Results (from the past 240 hour(s))  Respiratory Panel by RT PCR (Flu A&B, Covid) - Nasopharyngeal Swab     Status: None   Collection Time: 10/28/20  6:59 AM   Specimen: Nasopharyngeal Swab  Result Value Ref Range Status   SARS Coronavirus 2 by RT PCR NEGATIVE NEGATIVE Final    Comment: (NOTE) SARS-CoV-2 target nucleic acids are NOT DETECTED.  The SARS-CoV-2 RNA is generally detectable in upper  respiratoy specimens during the acute phase of infection. The lowest concentration of SARS-CoV-2 viral copies this assay can detect is 131 copies/mL. A negative result does not preclude SARS-Cov-2 infection and should not be used as the sole basis for  treatment or other patient management decisions. A negative result may occur with  improper specimen collection/handling, submission of specimen other than nasopharyngeal swab, presence of viral mutation(s) within the areas targeted by this assay, and inadequate number of viral copies (<131 copies/mL). A negative result must be combined with clinical observations, patient history, and epidemiological information. The expected result is Negative.  Fact Sheet for Patients:  PinkCheek.be  Fact Sheet for Healthcare Providers:  GravelBags.it  This test is no t yet approved or cleared by the [place] FDA and  has been authorized for detection and/or diagnosis of SARS-CoV-2 by FDA under an Emergency Use Authorization (EUA). This EUA will remain  in effect (meaning this test can be used) for the duration of the COVID-19 declaration under Section 564(b)(1) of the Act, 21 U.S.C. section 360bbb-3(b)(1), unless the authorization is terminated or revoked sooner.     Influenza A by PCR NEGATIVE NEGATIVE Final   Influenza B by PCR NEGATIVE NEGATIVE Final    Comment: (NOTE) The Xpert Xpress SARS-CoV-2/FLU/RSV assay is intended as an aid in  the diagnosis of influenza from Nasopharyngeal swab specimens and  should not be used as a sole basis for treatment. Nasal washings and  aspirates are unacceptable for Xpert Xpress SARS-CoV-2/FLU/RSV  testing.  Fact Sheet for Patients: PinkCheek.be  Fact Sheet for Healthcare Providers: GravelBags.it  This test is not yet approved or cleared by the [place] FDA and  has been  authorized for detection and/or diagnosis of SARS-CoV-2 by  FDA under an Emergency Use Authorization (EUA). This EUA will remain  in effect (meaning this test can be used) for the duration of the  Covid-19 declaration under Section 564(b)(1) of the Act, 21  U.S.C. section 360bbb-3(b)(1), unless the authorization is  terminated or revoked. Performed at [facility], [place] [address] [place], [place] [address]     Radiology Reports NM Pulmonary Perfusion  Result Date: 10/11/2020 CLINICAL DATA:  Shortness of breath and chest pain EXAM: NUCLEAR MEDICINE PERFUSION LUNG SCAN TECHNIQUE: Perfusion images were obtained in multiple projections after intravenous injection of radiopharmaceutical. Views: Anterior, posterior, RPO, LPO, RAO, LAO RADIOPHARMACEUTICALS:  4.2 mCi Tc-66m MAA IV COMPARISON:  Chest radiograph October 10, 2020 FINDINGS: Distribution of radiotracer uptake is homogeneous and symmetric bilaterally. No appreciable perfusion defects. IMPRESSION: No appreciable perfusion defects. No findings indicative of pulmonary embolus. Electronically Signed   By: Lowella Grip III M.D.   On: 10/11/2020 11:07   VAS [place] IVC/ILIAC (VENOUS ONLY)  Result Date: 10/29/2020 IVC/ILIAC STUDY Indications: Chronic Leg DVT Risk Factors: Hypertension.  Comparison Study: None Performing Technologist: Griffin Basil RVT, RDMS  Examination Guidelines: A complete evaluation includes B-mode imaging, spectral Doppler, color Doppler, and power Doppler as needed of all accessible portions of each vessel. Bilateral testing is considered an integral part of a complete examination. Limited examinations for reoccurring indications may be performed as noted.  IVC/Iliac Findings: +----------+------+--------+--------+    IVC    PatentThrombusComments +----------+------+--------+--------+ IVC Prox  patent                 +----------+------+--------+--------+ IVC Mid         chronic           +----------+------+--------+--------+ IVC Distalpatent                 +----------+------+--------+--------+  +-------------------+---------+-----------+---------+-----------+--------+         CIV        RT-PatentRT-ThrombusLT-PatentLT-ThrombusComments +-------------------+---------+-----------+---------+-----------+--------+ Common Iliac Prox   patent  patent                      +-------------------+---------+-----------+---------+-----------+--------+ Common Iliac Mid    patent              patent                      +-------------------+---------+-----------+---------+-----------+--------+ Common Iliac Distal patent              patent                      +-------------------+---------+-----------+---------+-----------+--------+    Summary: IVC/Iliac: Mid IVC Thrombus , Hyperechoic non occlusive.  *See table(s) above for measurements and observations.  Electronically signed by Monica Martinez MD on 10/29/2020 at 3:24:42 PM.   Final    DG Chest Portable 1 View  Result Date: 10/28/2020 CLINICAL DATA:  Shortness of breath EXAM: PORTABLE CHEST 1 VIEW COMPARISON:  10/10/2020 FINDINGS: The heart size and mediastinal contours are within normal limits. Both lungs are clear. The visualized skeletal structures are unremarkable. The patient is status post prior median sternotomy. Aortic calcifications are noted. There are calcified bilateral breast implants. There is pleuroparenchymal scarring at the lung apices. There are end-stage degenerative changes of the left glenohumeral joint. The patient is status post prior total shoulder arthroplasty on the right. IMPRESSION: No active disease. Electronically Signed   By: Constance Holster M.D.   On: 10/28/2020 01:49   DG CHEST PORT 1 VIEW  Result Date: 10/10/2020 CLINICAL DATA:  Dyspnea. EXAM: PORTABLE CHEST 1 VIEW COMPARISON:  October 09, 2020. FINDINGS: Stable cardiomediastinal silhouette. Status post coronary bypass  graft. No pneumothorax or pleural effusion is noted. No acute pulmonary disease is noted. Status post right shoulder arthroplasty. IMPRESSION: No active disease. Aortic Atherosclerosis (ICD10-I70.0). Electronically Signed   By: Marijo Conception M.D.   On: 10/10/2020 10:13   DG Chest Port 1 View  Result Date: 10/09/2020 CLINICAL DATA:  Shortness of breath, cough and fever. EXAM: PORTABLE CHEST 1 VIEW COMPARISON:  10/05/2019 FINDINGS: Previous median sternotomy and CABG. Mild cardiomegaly. Aortic atherosclerosis. Bilateral breast implants with density overlying the chest. Suspicion of bronchitis and hazy pneumonia in the mid and lower lungs. No dense consolidation, lobar collapse or effusion. Old shoulder replacement on the right. Chronic degenerative changes of the left shoulder. IMPRESSION: 1. Suspicion of bronchitis and hazy pneumonia in the mid and lower lungs. No dense consolidation or lobar collapse. 2. Previous CABG. Mild cardiomegaly. Aortic atherosclerosis. Electronically Signed   By: Nelson Chimes M.D.   On: 10/09/2020 13:15   ECHOCARDIOGRAM COMPLETE  Result Date: 10/09/2020    ECHOCARDIOGRAM REPORT   Patient Name:   SHARENE KRIKORIAN Date of Exam: 10/09/2020 Medical Rec #:  [id number]      Height:       68.0 in Accession #:    [id number]     Weight:       167.5 lb Date of Birth:  1941-02-01       BSA:          1.896 m Patient Age:    30 years       BP:           92/42 mmHg Patient Gender: F              HR:           88 bpm. Exam Location:  Inpatient Procedure: Cardiac Doppler, Color Doppler, 2D  Echo and 3D Echo STAT ECHO Indications:    R07.9* Chest pain, unspecified  History:        Patient has prior history of Echocardiogram examinations, most                 recent 10/05/2019. CAD, Abnormal ECG and Prior CABG, COPD,                 Signs/Symptoms:Dyspnea; Risk Factors:Sleep Apnea and                 Dyslipidemia. Hypoxia. Pulmonary embolus.  Sonographer:    Roseanna Rainbow RDCS Referring Phys: [id number]  [place]  1. Diastology suggestive of impaired LV filling. Left ventricular ejection fraction, by estimation, is 60 to 65%. The left ventricle has normal function. The left ventricle has no regional wall motion abnormalities. There is moderate concentric left ventricular hypertrophy. Left ventricular diastolic parameters are indeterminate.  2. Right ventricular systolic function is low normal. The right ventricular size is normal. There is normal pulmonary artery systolic pressure.  3. Left atrial size was moderately dilated.  4. Right atrial size was moderately dilated.  5. The mitral valve is myxomatous. No evidence of mitral valve regurgitation. The mean mitral valve gradient is 5.0 mmHg with average heart rate of 87 bpm. Moderate mitral annular calcification.  6. The aortic valve is tricuspid. Aortic valve regurgitation is not visualized.  7. The inferior vena cava is dilated in size with <50% respiratory variability, suggesting right atrial pressure of 15 mmHg. Comparison(s): A prior study was performed on 10/05/2019. No significant change from prior study. Subtle decrease in RV function. FINDINGS  Left Ventricle: Diastology suggestive of impaired LV filling. Left ventricular ejection fraction, by estimation, is 60 to 65%. The left ventricle has normal function. The left ventricle has no regional wall motion abnormalities. The left ventricular internal cavity size was normal in size. There is moderate concentric left ventricular hypertrophy. Left ventricular diastolic parameters are indeterminate. Right Ventricle: The right ventricular size is normal. Right vetricular wall thickness was not well visualized. Right ventricular systolic function is low normal. There is normal pulmonary artery systolic pressure. The tricuspid regurgitant velocity is 2.30 m/s, and with an assumed right atrial pressure of 3 mmHg, the estimated right ventricular systolic pressure is 71.6 mmHg. Left Atrium: Left  atrial size was moderately dilated. Right Atrium: Right atrial size was moderately dilated. Pericardium: There is no evidence of pericardial effusion. Mitral Valve: The mitral valve is myxomatous. There is mild calcification of the mitral valve leaflet(s). Moderate mitral annular calcification. No evidence of mitral valve regurgitation. MV peak gradient, 25.4 mmHg. The mean mitral valve gradient is 5.0  mmHg with average heart rate of 87 bpm. Tricuspid Valve: The tricuspid valve is grossly normal. Tricuspid valve regurgitation is trivial. Aortic Valve: The aortic valve is tricuspid. Aortic valve regurgitation is not visualized. Pulmonic Valve: The pulmonic valve was not well visualized. Pulmonic valve regurgitation is trivial. Aorta: The aortic root and ascending aorta are structurally normal, with no evidence of dilitation. Venous: The inferior vena cava is dilated in size with less than 50% respiratory variability, suggesting right atrial pressure of 15 mmHg. IAS/Shunts: The interatrial septum appears to be lipomatous. The atrial septum is grossly normal.  LEFT VENTRICLE PLAX 2D LVIDd:         4.60 cm     Diastology LVIDs:         3.00 cm     LV e' medial:  5.87 cm/s LV PW:         1.40 cm     LV E/e' medial:  27.1 LV IVS:        1.40 cm     LV e' lateral:   8.21 cm/s LVOT diam:     1.70 cm     LV E/e' lateral: 19.4 LV SV:         62 LV SV Index:   33 LVOT Area:     2.27 cm  LV Volumes (MOD) LV vol d, MOD A2C: 99.9 ml LV vol d, MOD A4C: 71.4 ml LV vol s, MOD A2C: 35.9 ml LV vol s, MOD A4C: 26.8 ml LV SV MOD A2C:     64.0 ml LV SV MOD A4C:     71.4 ml LV SV MOD BP:      56.6 ml RIGHT VENTRICLE            IVC RV S prime:     8.08 cm/s  IVC diam: 3.00 cm TAPSE (M-mode): 1.4 cm LEFT ATRIUM             Index       RIGHT ATRIUM           Index LA diam:        3.80 cm 2.00 cm/m  RA Area:     17.30 cm LA Vol (A2C):   56.3 ml 29.70 ml/m RA Volume:   44.90 ml  23.69 ml/m LA Vol (A4C):   44.9 ml 23.69 ml/m LA Biplane  Vol: 55.6 ml 29.33 ml/m  AORTIC VALVE LVOT Vmax:   129.00 cm/s LVOT Vmean:  86.900 cm/s LVOT VTI:    0.272 m  AORTA Ao Root diam: 3.40 cm Ao Asc diam:  3.40 cm MITRAL VALVE                TRICUSPID VALVE MV Area (PHT): 4.15 cm     TR Peak grad:   21.2 mmHg MV Peak grad:  25.4 mmHg    TR Vmax:        230.00 cm/s MV Mean grad:  5.0 mmHg MV Vmax:       2.52 m/s     SHUNTS MV Vmean:      88.8 cm/s    Systemic VTI:  0.27 m MV Decel Time: 183 msec     Systemic Diam: 1.70 cm MV E velocity: 159.00 cm/s MV A velocity: 209.00 cm/s MV E/A ratio:  0.76 Rudean Haskell MD Electronically signed by Rudean Haskell MD Signature Date/Time: 10/09/2020/4:38:55 PM    Final    CT VENOGRAM ABD/PEL  Result Date: 10/30/2020 CLINICAL DATA:  History of DVT with IVC filter placement. Evaluate for central/caval thrombosis. EXAM: CT ABDOMEN AND PELVIS WITH CONTRAST TECHNIQUE: Multidetector CT imaging of the abdomen and pelvis was performed using the standard protocol following bolus administration of intravenous contrast. CONTRAST:  179mL OMNIPAQUE IOHEXOL 350 MG/ML SOLN COMPARISON:  CT abdomen pelvis-04/01/2018 nuclear medicine perfusion scan-10/11/2020 FINDINGS: Lower chest: Limited visualization of the lower thorax demonstrates chronic small right and trace left-sided effusions, similar to the 2019 examination. Improved aeration of the right lung base with persistent bibasilar consolidative opacities, likely atelectasis. Cardiomegaly. Exuberant calcifications about the mitral valve annulus. Coronary artery calcifications. No pericardial effusion. Hepatobiliary: There is mild diffuse decreased attenuation hepatic parenchyma, nonspecific though could be seen in the setting previous amiodarone therapy. No discrete hepatic lesions. Post cholecystectomy. There is mild prominence of the extra and intrahepatic biliary system  presumably secondary to biliary reservoir phenomena. No ascites. Pancreas: Pancreas appears largely atrophic.  Spleen: Normal appearance of the spleen. Adrenals/Urinary Tract: There is symmetric enhancement and excretion of the bilateral kidneys. Note is made of an approximately 2.6 cm hypoattenuating cyst involving the inferior medial aspect of the right kidney. Left-sided hypoattenuating renal lesions are too small to accurately characterize though favored to represent additional renal cysts. No evidence of nephrolithiasis on this postcontrast examination. No urinary obstruction or perinephric stranding. There is mild thickening of the crux of the left adrenal gland without discrete nodule. Normal appearance of the right adrenal gland. Suspected mild thickening of the urinary bladder wall, potentially accentuated due to underdistention and suboptimally evaluated due to streak artifact from the patient's bilateral total hip prostheses. Stomach/Bowel: Moderate colonic stool burden without evidence of enteric obstruction. Normal appearance of the terminal ileum. The appendix is not visualized, however there is no pericecal inflammatory change. No evidence of enteric obstruction. No pneumoperitoneum, pneumatosis or portal venous gas. Vascular/Lymphatic: There is a large amount of predominantly calcified slightly irregular atherosclerotic plaque throughout the normal caliber abdominal aorta. Atherosclerotic plaque likely approaches 50% luminal narrowing involving the infrarenal abdominal aorta (axial image 24, series 3; sagittal image 83, series 4). The patient has undergone stenting involving the origin and proximal aspect of the SMA, the patency of which is suboptimally evaluated due to phase of enhancement as well as significant streak artifact from the patient's lumbar spine hardware. Post IVC filter placement. There may be a small amount of nonocclusive thrombus within the conical portion of the IVC filter (axial image 31, series 3; coronal image 81, series 4) however this may be artifactual due to significant adjacent  streak artifact. Otherwise, the IVC and pelvic venous systems appear widely patent. Evaluation of the bilateral common femoral veins is degraded secondary to significant streak artifact from the patient's bilateral total hip prostheses. Redemonstrated mildly prominent bilateral inguinal lymph nodes with index left inguinal lymph node measuring 1.1 cm in greatest short axis diameter (image 71, series 3), similar to the 2019 examination and presumably reactive in etiology. No bulky retroperitoneal, mesenteric, pelvic or inguinal lymphadenopathy Reproductive: Evaluation degraded secondary streak artifact from the patient's bilateral total hip prostheses. Other: Redemonstrated peripherally calcified breast prostheses. A small amount of fluid is noted within a tiny midline ventral wall hernia which measures approximately 3.6 x 2.6 cm (sagittal image 90, series 5). Diffuse body wall anasarca. Musculoskeletal: Stable sequela of L1-L3 paraspinal fusion with L4 laminectomy and intervertebral disc space replacement of L1-L2, L2-L3, L3-L4, L4-L5. Fluid is again noted posterior to the spinal operative site. 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 Interval left total hip replacement. IMPRESSION: 1. Post IVC filter placement with possible small amount of nonocclusive thrombus within the conical portion of the IVC filter however this may be artifactual due to significant adjacent streak artifact from the patient's long segment paraspinal fusion hardware. Regardless, this is of doubtful clinical concern as the IVC and pelvic venous systems appear otherwise widely patent. 2. Moderate colonic stool burden without evidence of enteric obstruction. 3. Cardiomegaly with small right and trace left-sided effusions, similar to the 2019 examination. 4. Improved aeration of the right lung base with persistent bibasilar consolidative opacities, likely atelectasis/scar. 5. Coronary calcifications.  Aortic Atherosclerosis (ICD10-I70.0).  6. Significant atherosclerotic plaque throughout the abdominal aorta including potential 50% luminal narrowing involving the infrarenal abdominal aorta and the sequela of previous stenting of the SMA, incompletely evaluated due to phase of contrast enhancement as well  as significant streak artifact from the patient's long segment paraspinal fusion hardware. Electronically Signed   By: Sandi Mariscal M.D.   On: 10/30/2020 15:40   VAS [place] LOWER EXTREMITY VENOUS (DVT)  Result Date: 10/10/2020  Lower Venous DVTStudy Indications: Elevated ddimer.  Comparison Study: previous 11/03/18 Performing Technologist: Abram Sander RVS  Examination Guidelines: A complete evaluation includes B-mode imaging, spectral Doppler, color Doppler, and power Doppler as needed of all accessible portions of each vessel. Bilateral testing is considered an integral part of a complete examination. Limited examinations for reoccurring indications may be performed as noted. The reflux portion of the exam is performed with the patient in reverse Trendelenburg.  +---------+---------------+---------+-----------+----------+--------------+ RIGHT    CompressibilityPhasicitySpontaneityPropertiesThrombus Aging +---------+---------------+---------+-----------+----------+--------------+ CFV      Full           Yes      Yes                                 +---------+---------------+---------+-----------+----------+--------------+ SFJ      Full                                                        +---------+---------------+---------+-----------+----------+--------------+ FV Prox  Full                                                        +---------+---------------+---------+-----------+----------+--------------+ FV Mid   Full                                                        +---------+---------------+---------+-----------+----------+--------------+ FV DistalFull                                                         +---------+---------------+---------+-----------+----------+--------------+ PFV      Full                                                        +---------+---------------+---------+-----------+----------+--------------+ POP      None           Yes      Yes                  Chronic        +---------+---------------+---------+-----------+----------+--------------+ PTV      Full                                                        +---------+---------------+---------+-----------+----------+--------------+  PERO     Full                                                        +---------+---------------+---------+-----------+----------+--------------+   +---------+---------------+---------+-----------+----------+--------------+ LEFT     CompressibilityPhasicitySpontaneityPropertiesThrombus Aging +---------+---------------+---------+-----------+----------+--------------+ CFV      Full           Yes      Yes                                 +---------+---------------+---------+-----------+----------+--------------+ SFJ      Full                                                        +---------+---------------+---------+-----------+----------+--------------+ FV Prox  Full                                                        +---------+---------------+---------+-----------+----------+--------------+ FV Mid   Full                                                        +---------+---------------+---------+-----------+----------+--------------+ FV DistalFull                                                        +---------+---------------+---------+-----------+----------+--------------+ PFV      Full                                                        +---------+---------------+---------+-----------+----------+--------------+ POP      Full           Yes      Yes                                  +---------+---------------+---------+-----------+----------+--------------+ PTV      Full                                                        +---------+---------------+---------+-----------+----------+--------------+ PERO     Full                                                        +---------+---------------+---------+-----------+----------+--------------+  Summary: RIGHT: - Findings consistent with chronic deep vein thrombosis involving the right popliteal vein. - No cystic structure found in the popliteal fossa.  LEFT: - There is no evidence of deep vein thrombosis in the lower extremity.  - No cystic structure found in the popliteal fossa.  *See table(s) above for measurements and observations. Electronically signed by Harold Barban MD on 10/10/2020 at 7:40:41 PM.    Final    VAS [place] UPPER EXTREMITY VENOUS DUPLEX  Result Date: 10/10/2020 UPPER VENOUS STUDY  Indications: elevated ddimer Comparison Study: no prior Performing Technologist: Abram Sander RVS  Examination Guidelines: A complete evaluation includes B-mode imaging, spectral Doppler, color Doppler, and power Doppler as needed of all accessible portions of each vessel. Bilateral testing is considered an integral part of a complete examination. Limited examinations for reoccurring indications may be performed as noted.  Right Findings: +----------+------------+---------+-----------+----------+-------+ RIGHT     CompressiblePhasicitySpontaneousPropertiesSummary +----------+------------+---------+-----------+----------+-------+ IJV           Full       Yes       Yes                      +----------+------------+---------+-----------+----------+-------+ Subclavian    Full       Yes       Yes                      +----------+------------+---------+-----------+----------+-------+ Axillary      Full       Yes       Yes                      +----------+------------+---------+-----------+----------+-------+  Brachial      Full       Yes       Yes                      +----------+------------+---------+-----------+----------+-------+ Radial        Full                                          +----------+------------+---------+-----------+----------+-------+ Ulnar         Full                                          +----------+------------+---------+-----------+----------+-------+ Cephalic      Full                                          +----------+------------+---------+-----------+----------+-------+ Basilic       Full                                          +----------+------------+---------+-----------+----------+-------+  Left Findings: +----------+------------+---------+-----------+----------+-------+ LEFT      CompressiblePhasicitySpontaneousPropertiesSummary +----------+------------+---------+-----------+----------+-------+ IJV           Full       Yes       Yes                      +----------+------------+---------+-----------+----------+-------+ Subclavian  Full       Yes       Yes                      +----------+------------+---------+-----------+----------+-------+ Axillary      Full       Yes       Yes                      +----------+------------+---------+-----------+----------+-------+ Brachial      Full       Yes       Yes                      +----------+------------+---------+-----------+----------+-------+ Radial        Full                                          +----------+------------+---------+-----------+----------+-------+ Ulnar         Full                                          +----------+------------+---------+-----------+----------+-------+ Cephalic      Full                                          +----------+------------+---------+-----------+----------+-------+ Basilic       Full                                           +----------+------------+---------+-----------+----------+-------+  Summary: No evidence of deep vein or superficial vein thrombosis involving the right and left upper extremities.  *See table(s) above for measurements and observations.  Diagnosing physician: Harold Barban MD Electronically signed by Harold Barban MD on 10/10/2020 at 7:40:28 PM.    Final

## 2020-11-02 LAB — BASIC METABOLIC PANEL
Anion gap: 8 (ref 5–15)
BUN: 13 mg/dL (ref 8–23)
CO2: 33 mmol/L — ABNORMAL HIGH (ref 22–32)
Calcium: 9.2 mg/dL (ref 8.9–10.3)
Chloride: 100 mmol/L (ref 98–111)
Creatinine, Ser: 0.93 mg/dL (ref 0.44–1.00)
GFR, Estimated: >60 mL/min (ref >60)
Glucose, Bld: 101 mg/dL — ABNORMAL HIGH (ref 70–99)
Potassium: 4.2 mmol/L (ref 3.5–5.1)
Sodium: 141 mmol/L (ref 135–145)

## 2020-11-02 MED ORDER — FUROSEMIDE 40 MG PO TABS: 40.0000 mg | ORAL_TABLET | Freq: Two times a day (BID) | ORAL | Status: DC

## 2020-11-02 MED ORDER — FUROSEMIDE 40 MG PO TABS
40.0000 mg | ORAL_TABLET | Freq: Every day | ORAL | Status: DC
  Administered 2020-11-02: 40 mg via ORAL
  Filled 2020-11-02: qty 1

## 2020-11-02 NOTE — Care Management Important Message (Signed)
Important Message  Patient Details  Name: [name] MRN: [id number] Date of Birth: [birth year]   Medicare Important Message Given:  Yes     Barb Merino [place] 11/02/2020, 11:19 AM

## 2020-11-02 NOTE — Consult Note (Signed)
   [facility] CM Inpatient Consult   11/02/2020  Stevi Hollinshead 1941/01/20 [id number]  [place]  Accountable Care Organization [ACO] Patient: [employer]   Patient is currently active with [place] [facility] for chronic disease management services.  Patient has been engaged by a [place] Management Coordinator.  Our community based plan of care has focused on disease management and community resource support.  Review of the medical record with inpatient TOC notes and PT recommendations reveals patient is for home and choses with home health.  Patient is less than 30 days rehospitalization noted with a high risk score for unplanned readmission.  Spoke with patient via hospital phone, HIPAA verified. She endorses, [name], MD as her primary care and has home health with [facility]. Patient endorses she is going to her son's home and the best contact will be her son's Deloma Spindle [phone]]  phone for follow up and permission to contact his number.  Plan:  Will update [facility] RN assigned already with contact information.  Will follow up with Inpatient Transition Of Care [TOC] team member to make aware that [place] [facility] following.  Of note, [facility] Care Management services does not replace or interfere with any services that are needed or arranged by inpatient [facility] care management team.  For additional questions or referrals please contact:  Natividad Brood, RN BSN [facility] Liaison  [phone] business mobile phone Toll free office [phone]  Fax number: [phone] [email or web address]

## 2020-11-02 NOTE — Plan of Care (Signed)
  Problem: Health Behavior/Discharge Planning: Goal: Ability to manage health-related needs will improve Outcome: Progressing   

## 2020-11-02 NOTE — TOC Transition Note (Signed)
Transition of Care [facility]) - CM/SW Discharge Note   Patient Details  Name: [name] MRN: [id number] Date of Birth: 13-May-1941  [facility] CM/SW Contact:  Bartholomew Crews, RN Phone Number: [phone] 11/02/2020, 10:03 AM   Clinical Narrative:     Spoke with patient at the bedside to review plans for transition to son's home today. She stated that her daughter in law will be picking her up from the hospital. Discussed referral for outpatient PT faxed to [facility] Location - contact information on AVS. [facility] to continue outpatient community case management. No further TOC needs identified.   Final next level of care: OP Rehab Barriers to Discharge: No Barriers Identified   Patient Goals and CMS Choice Patient states their goals for this hospitalization and ongoing recovery are:: home with son today - daughter in law to pick her up CMS Medicare.gov Compare Post Acute Care list provided to:: Patient Choice offered to / list presented to : Patient  Discharge Placement                       Discharge Plan and Services In-house Referral: NA Discharge Planning Services: CM Consult Post Acute Care Choice: NA          DME Arranged: N/A DME Agency: NA       HH Arranged: NA HH Agency: NA        Social Determinants of Health (SDOH) Interventions     Readmission Risk Interventions No flowsheet data found.

## 2020-11-02 NOTE — Discharge Summary (Signed)
Physician Discharge Summary  [name] QPR:[id number] DOB: February 18, 1941 DOA: 10/28/2020  PCP: [name], MD  Admit date: 10/28/2020 Discharge date: 11/02/2020  Time spent: 35 minutes  Recommendations for Outpatient Follow-up:  1. PCP in 1 week 2. [name], [facility] in 1 to 2 weeks with labs 3. Home health PT OT   Discharge Diagnoses:  Principal Problem:   AKI (acute kidney injury) ([place]) Hypotension Orthostatic hypotension Lower extremity edema COPD/chronic respiratory failure on 4 L home O2   Hypothyroidism   Essential hypertension   COPD (chronic obstructive pulmonary disease) (HCC)   Sleep apnea   Dyslipidemia   Chronic diastolic heart failure (HCC)   GERD without esophagitis   History of pulmonary embolism   Normocytic anemia   Discharge Condition: Stable  Diet recommendation: Low-sodium, heart healthy  Filed Weights   10/29/20 2047 10/31/20 2140 11/01/20 2043  Weight: 82 kg 82 kg 82 kg    History of present illness:  79 y.o.femalewith medical history significant ofCAD, CABG, chronic diastolic CHF,  carotid artery disease, depression, anxiety, recurrent DVT and PEs with GI bleeds from anticoagulation, status post IVC filter placement in 2019, chronic lower back pain, COPD, paroxysmal atrial fibrillation after CABG, GERD, hyperlipidemia, hypertension, hypothyroidism, class I obesity, osteoarthritis, history of pneumonia, peripheral vascular disease, sleep apneano longeron CPAP whowas recently discharged for sepsis secondary to pneumonia, came to the emergency department due to low blood pressure after she had been takinghigher than usual furosemide dose of 80 mg in the morning and 40 mg in the evening. H/o being lightheaded and weak for the past few days.  -Her work-up was significant for orthostasis and AKI   Hospital Course:   AKI /hypotension -Patient was hypotensive dizzy with acute kidney injury on admission, she had been taking  Lasix 80 mg in the morning and 40 mg in the evening, work-up in the ED was notable for orthostatic hypotension and acute kidney injury with creatinine of 2.2 on admission -Diuretics were held initially, hydrated with saline, kidney function normalized -Subsequently did develop increasing edema, Lasix restarted -Clinically euvolemic and kidney function stable on lower doses of oral Lasix -Advised to follow-up with PCP in 1 week and repeat BMP then  Subacute bilateral lower extremity edema -I think this is multifactorial, secondary to venous stasis, fluid overload and IVC filter -Appreciate vascular input, CT venogram rules out complete occlusion of IVC filter -Compression stockings recommended, Lasix restarted at 40 mg twice daily, kidney function stable on this regimen -Educated to elevate her legs, wear compression stockings and restrict salt  Acute on chronic diastolic CHF -Diuresed with IV Lasix later this admission and then transition to oral Lasix 40 mg twice daily at discharge, needs labs checked in 1 week  Hypokalemia -Repleted  Hypothyroidism Continue levothyroxine 50 mcg p.o. daily.  Essential hypertension -Stable  COPD (chronic obstructive pulmonary disease) (HCC) Chronic hypoxic respiratory failure on 4 L home O2 Continue daily Daliresp, Spiriva and Combivent. -Stable, no wheezing  Obstructive sleep apnea -Does not use CPAP anymore  Dyslipidemia Continue atorvastatin 20 mg p.o. daily  GERD without esophagitis Pantoprazole 40 mg p.o. daily.  History of pulmonary embolismand DVT. -Remote history of IVC placement. -History of recurrent bleeding and inability to tolerate anticoagulation  Normocytic anemia -Stable  Discharge Exam: Vitals:   11/02/20 0452 11/02/20 1015  BP: 131/80 127/70  Pulse: 74 74  Resp: 19 16  Temp: 98.2 F (36.8 C) 97.8 F (36.6 C)  SpO2: 98% 93%  General: AAOx3 Cardiovascular: S1S2/RRR Respiratory:  CTAB  Discharge Instructions   Discharge Instructions    Diet - low sodium heart healthy   Complete by: As directed    Increase activity slowly   Complete by: As directed      Allergies as of 11/02/2020      Reactions   Ativan [lorazepam] Other (See Comments)   Confusion    Pitavastatin Other (See Comments)   LIVALO: reaction not recalled   Ropinirole Nausea Only, Other (See Comments)   Makes legs jump, also   Zofran [ondansetron Hcl] Nausea Only   Zolpidem Tartrate Anxiety, Other (See Comments)   CONFUSION    Penicillins Rash, Other (See Comments)   Tolerated cephalosporins in past Has patient had a PCN reaction causing immediate rash, facial/tongue/throat swelling, SOB or lightheadedness with hypotension: Yes Has patient had a PCN reaction causing severe rash involving mucus membranes or skin necrosis: No Has patient had a PCN reaction that required hospitalization: No Has patient had a PCN reaction occurring within the last 10 years: No If all of the above answers are "NO", then may proceed with Cephalosporin use.      Medication List    STOP taking these medications   metolazone 2.5 MG tablet Commonly known as: ZAROXOLYN     TAKE these medications   allopurinol 100 MG tablet Commonly known as: ZYLOPRIM Take 100 mg by mouth daily.   ARIPiprazole 2 MG tablet Commonly known as: ABILIFY Take 2 mg by mouth daily.   ascorbic acid 500 MG tablet Commonly known as: VITAMIN C Take 500 mg by mouth daily.   aspirin 81 MG chewable tablet Chew 1 tablet (81 mg total) by mouth daily.   atorvastatin 20 MG tablet Commonly known as: LIPITOR Take 20 mg by mouth daily.   citalopram 40 MG tablet Commonly known as: CELEXA Take 40 mg by mouth at bedtime.   cyclobenzaprine 10 MG tablet Commonly known as: FLEXERIL Take 10 mg by mouth 3 (three) times daily as needed for muscle spasms.   ferrous sulfate 325 (65 FE) MG tablet Commonly known as: FerrouSul Take 1 tablet (325  mg total) by mouth 3 (three) times daily with meals for 14 days. What changed: when to take this   furosemide 40 MG tablet Commonly known as: LASIX Take 1 tablet (40 mg total) by mouth 2 (two) times daily. Take two tablets by mouth every Monday and Thursday afternoon.  Take one tablet all other afternoons. What changed:   how much to take  how to take this  when to take this   gabapentin 100 MG capsule Commonly known as: NEURONTIN Take 100 mg by mouth daily.   guaiFENesin 600 MG 12 hr tablet Commonly known as: MUCINEX Take 600 mg by mouth 2 (two) times daily.   Incruse Ellipta 62.5 MCG/INH Aepb Generic drug: umeclidinium bromide Inhale 1 puff into the lungs 2 (two) times daily.   Ipratropium-Albuterol 20-100 MCG/ACT Aers respimat Commonly known as: COMBIVENT Inhale 1 puff into the lungs every 6 (six) hours as needed for wheezing or shortness of breath (or to help expel mucous).   levothyroxine 50 MCG tablet Commonly known as: SYNTHROID Take 1 tablet (50 mcg total) by mouth daily at 6 (six) AM.   magnesium oxide 400 (241.3 Mg) MG tablet Commonly known as: MAG-OX Take 1 tablet (400 mg total) by mouth 2 (two) times daily.   Melatonin 10 MG Tabs Take 10 mg by mouth at bedtime.   neomycin-polymyxin-hydrocortisone 3.5-10000-1  OTIC suspension Commonly known as: CORTISPORIN Place 5 drops into the right ear daily as needed (ear pain).   nitroGLYCERIN 0.4 MG SL tablet Commonly known as: NITROSTAT Place 1 tablet (0.4 mg total) under the tongue every 5 (five) minutes as needed for chest pain. For chest pain   ofloxacin 0.3 % OTIC solution Commonly known as: FLOXIN Place 2 drops into the left ear daily.   omeprazole 40 MG capsule Commonly known as: PRILOSEC Take 1 capsule (40 mg total) by mouth 2 (two) times daily. What changed: when to take this   OXYGEN Inhale 4 L/min into the lungs as needed (DURING ALL TIMES OF EXERTION).   pilocarpine 1 % ophthalmic  solution Commonly known as: PILOCAR Place 1 drop into both eyes 2 (two) times daily.   polyethylene glycol 17 g packet Commonly known as: MIRALAX / GLYCOLAX Take 17 g by mouth 2 (two) times daily. What changed: when to take this   potassium chloride SA 20 MEQ tablet Commonly known as: KLOR-CON Take 20 mEq by mouth daily.   pramipexole 0.25 MG tablet Commonly known as: MIRAPEX Take 0.25 mg by mouth every evening.   roflumilast 500 MCG Tabs tablet Commonly known as: DALIRESP Take 500 mcg by mouth every evening.   Spiriva Respimat 1.25 MCG/ACT Aers Generic drug: Tiotropium Bromide Monohydrate Inhale 2 puffs into the lungs daily.   traZODone 50 MG tablet Commonly known as: DESYREL Take 150 mg by mouth at bedtime.   Xtampza ER 9 MG C12a Generic drug: oxyCODONE ER Take 9 mg by mouth every 8 (eight) hours.      Allergies  Allergen Reactions  . Ativan [Lorazepam] Other (See Comments)    Confusion   . Pitavastatin Other (See Comments)    LIVALO: reaction not recalled  . Ropinirole Nausea Only and Other (See Comments)    Makes legs jump, also  . Zofran [Ondansetron Hcl] Nausea Only  . Zolpidem Tartrate Anxiety and Other (See Comments)    CONFUSION   . Penicillins Rash and Other (See Comments)    Tolerated cephalosporins in past Has patient had a PCN reaction causing immediate rash, facial/tongue/throat swelling, SOB or lightheadedness with hypotension: Yes Has patient had a PCN reaction causing severe rash involving mucus membranes or skin necrosis: No Has patient had a PCN reaction that required hospitalization: No Has patient had a PCN reaction occurring within the last 10 years: No If all of the above answers are "NO", then may proceed with Cephalosporin use.     Follow-up [facility] Location Follow up.   Why: Referral was faxed 11/01/2020 to [phone] for outpatient physical therapy Contact information: [address] [place], [place] [address] [phone]       [name], [name], MD. Schedule an appointment as soon as possible for a visit in 1 week(s).   Specialty: Internal Medicine Contact information: [address] [place] [address] [phone]        Fay Records, MD. Schedule an appointment as soon as possible for a visit in 2 week(s).   Specialty: Cardiology Contact information: [place] [address] [phone]                The results of significant diagnostics from this hospitalization (including imaging, microbiology, ancillary and laboratory) are listed below for reference.    Significant Diagnostic Studies: NM Pulmonary Perfusion  Result Date: 10/11/2020 CLINICAL DATA:  Shortness of  breath and chest pain EXAM: NUCLEAR MEDICINE PERFUSION LUNG SCAN TECHNIQUE: Perfusion images were obtained in multiple projections after intravenous injection of radiopharmaceutical. Views: Anterior, posterior, RPO, LPO, RAO, LAO RADIOPHARMACEUTICALS:  4.2 mCi Tc-5m MAA IV COMPARISON:  Chest radiograph October 10, 2020 FINDINGS: Distribution of radiotracer uptake is homogeneous and symmetric bilaterally. No appreciable perfusion defects. IMPRESSION: No appreciable perfusion defects. No findings indicative of pulmonary embolus. Electronically Signed   By: Lowella Grip III M.D.   On: 10/11/2020 11:07   VAS [place] IVC/ILIAC (VENOUS ONLY)  Result Date: 10/29/2020 IVC/ILIAC STUDY Indications: Chronic Leg DVT Risk Factors: Hypertension.  Comparison Study: None Performing Technologist: Griffin Basil RVT, RDMS  Examination Guidelines: A complete evaluation includes B-mode imaging, spectral Doppler, color Doppler, and power Doppler as needed of all accessible portions of each vessel. Bilateral testing is considered an integral part of a complete examination. Limited examinations for reoccurring indications may be performed as noted.  IVC/Iliac Findings:  +----------+------+--------+--------+    IVC    PatentThrombusComments +----------+------+--------+--------+ IVC Prox  patent                 +----------+------+--------+--------+ IVC Mid         chronic          +----------+------+--------+--------+ IVC Distalpatent                 +----------+------+--------+--------+  +-------------------+---------+-----------+---------+-----------+--------+         CIV        RT-PatentRT-ThrombusLT-PatentLT-ThrombusComments +-------------------+---------+-----------+---------+-----------+--------+ Common Iliac Prox   patent              patent                      +-------------------+---------+-----------+---------+-----------+--------+ Common Iliac Mid    patent              patent                      +-------------------+---------+-----------+---------+-----------+--------+ Common Iliac Distal patent              patent                      +-------------------+---------+-----------+---------+-----------+--------+    Summary: IVC/Iliac: Mid IVC Thrombus , Hyperechoic non occlusive.  *See table(s) above for measurements and observations.  Electronically signed by Monica Martinez MD on 10/29/2020 at 3:24:42 PM.   Final    DG Chest Portable 1 View  Result Date: 10/28/2020 CLINICAL DATA:  Shortness of breath EXAM: PORTABLE CHEST 1 VIEW COMPARISON:  10/10/2020 FINDINGS: The heart size and mediastinal contours are within normal limits. Both lungs are clear. The visualized skeletal structures are unremarkable. The patient is status post prior median sternotomy. Aortic calcifications are noted. There are calcified bilateral breast implants. There is pleuroparenchymal scarring at the lung apices. There are end-stage degenerative changes of the left glenohumeral joint. The patient is status post prior total shoulder arthroplasty on the right. IMPRESSION: No active disease. Electronically Signed   By: Constance Holster M.D.   On:  10/28/2020 01:49   DG CHEST PORT 1 VIEW  Result Date: 10/10/2020 CLINICAL DATA:  Dyspnea. EXAM: PORTABLE CHEST 1 VIEW COMPARISON:  October 09, 2020. FINDINGS: Stable cardiomediastinal silhouette. Status post coronary bypass graft. No pneumothorax or pleural effusion is noted. No acute pulmonary disease is noted. Status post right shoulder arthroplasty. IMPRESSION: No active disease. Aortic Atherosclerosis (ICD10-I70.0). Electronically Signed   By: Bobbe Medico.D.  On: 10/10/2020 10:13   DG Chest Port 1 View  Result Date: 10/09/2020 CLINICAL DATA:  Shortness of breath, cough and fever. EXAM: PORTABLE CHEST 1 VIEW COMPARISON:  10/05/2019 FINDINGS: Previous median sternotomy and CABG. Mild cardiomegaly. Aortic atherosclerosis. Bilateral breast implants with density overlying the chest. Suspicion of bronchitis and hazy pneumonia in the mid and lower lungs. No dense consolidation, lobar collapse or effusion. Old shoulder replacement on the right. Chronic degenerative changes of the left shoulder. IMPRESSION: 1. Suspicion of bronchitis and hazy pneumonia in the mid and lower lungs. No dense consolidation or lobar collapse. 2. Previous CABG. Mild cardiomegaly. Aortic atherosclerosis. Electronically Signed   By: Nelson Chimes M.D.   On: 10/09/2020 13:15   ECHOCARDIOGRAM COMPLETE  Result Date: 10/09/2020    ECHOCARDIOGRAM REPORT   Patient Name:   [name] Date of Exam: 10/09/2020 Medical Rec #:  [id number]      Height:       68.0 in Accession #:    [id number]     Weight:       167.5 lb Date of Birth:  1941-03-27       BSA:          1.896 m Patient Age:    87 years       BP:           92/42 mmHg Patient Gender: F              HR:           88 bpm. Exam Location:  Inpatient Procedure: Cardiac Doppler, Color Doppler, 2D Echo and 3D Echo STAT ECHO Indications:    R07.9* Chest pain, unspecified  History:        Patient has prior history of Echocardiogram examinations, most                 recent  10/05/2019. CAD, Abnormal ECG and Prior CABG, COPD,                 Signs/Symptoms:Dyspnea; Risk Factors:Sleep Apnea and                 Dyslipidemia. Hypoxia. Pulmonary embolus.  Sonographer:    Roseanna Rainbow RDCS Referring Phys: [id number] [place]  1. Diastology suggestive of impaired LV filling. Left ventricular ejection fraction, by estimation, is 60 to 65%. The left ventricle has normal function. The left ventricle has no regional wall motion abnormalities. There is moderate concentric left ventricular hypertrophy. Left ventricular diastolic parameters are indeterminate.  2. Right ventricular systolic function is low normal. The right ventricular size is normal. There is normal pulmonary artery systolic pressure.  3. Left atrial size was moderately dilated.  4. Right atrial size was moderately dilated.  5. The mitral valve is myxomatous. No evidence of mitral valve regurgitation. The mean mitral valve gradient is 5.0 mmHg with average heart rate of 87 bpm. Moderate mitral annular calcification.  6. The aortic valve is tricuspid. Aortic valve regurgitation is not visualized.  7. The inferior vena cava is dilated in size with <50% respiratory variability, suggesting right atrial pressure of 15 mmHg. Comparison(s): A prior study was performed on 10/05/2019. No significant change from prior study. Subtle decrease in RV function. FINDINGS  Left Ventricle: Diastology suggestive of impaired LV filling. Left ventricular ejection fraction, by estimation, is 60 to 65%. The left ventricle has normal function. The left ventricle has no regional wall motion abnormalities. The left ventricular internal cavity size was normal in  size. There is moderate concentric left ventricular hypertrophy. Left ventricular diastolic parameters are indeterminate. Right Ventricle: The right ventricular size is normal. Right vetricular wall thickness was not well visualized. Right ventricular systolic function is low normal.  There is normal pulmonary artery systolic pressure. The tricuspid regurgitant velocity is 2.30 m/s, and with an assumed right atrial pressure of 3 mmHg, the estimated right ventricular systolic pressure is 44.3 mmHg. Left Atrium: Left atrial size was moderately dilated. Right Atrium: Right atrial size was moderately dilated. Pericardium: There is no evidence of pericardial effusion. Mitral Valve: The mitral valve is myxomatous. There is mild calcification of the mitral valve leaflet(s). Moderate mitral annular calcification. No evidence of mitral valve regurgitation. MV peak gradient, 25.4 mmHg. The mean mitral valve gradient is 5.0  mmHg with average heart rate of 87 bpm. Tricuspid Valve: The tricuspid valve is grossly normal. Tricuspid valve regurgitation is trivial. Aortic Valve: The aortic valve is tricuspid. Aortic valve regurgitation is not visualized. Pulmonic Valve: The pulmonic valve was not well visualized. Pulmonic valve regurgitation is trivial. Aorta: The aortic root and ascending aorta are structurally normal, with no evidence of dilitation. Venous: The inferior vena cava is dilated in size with less than 50% respiratory variability, suggesting right atrial pressure of 15 mmHg. IAS/Shunts: The interatrial septum appears to be lipomatous. The atrial septum is grossly normal.  LEFT VENTRICLE PLAX 2D LVIDd:         4.60 cm     Diastology LVIDs:         3.00 cm     LV e' medial:    5.87 cm/s LV PW:         1.40 cm     LV E/e' medial:  27.1 LV IVS:        1.40 cm     LV e' lateral:   8.21 cm/s LVOT diam:     1.70 cm     LV E/e' lateral: 19.4 LV SV:         62 LV SV Index:   33 LVOT Area:     2.27 cm  LV Volumes (MOD) LV vol d, MOD A2C: 99.9 ml LV vol d, MOD A4C: 71.4 ml LV vol s, MOD A2C: 35.9 ml LV vol s, MOD A4C: 26.8 ml LV SV MOD A2C:     64.0 ml LV SV MOD A4C:     71.4 ml LV SV MOD BP:      56.6 ml RIGHT VENTRICLE            IVC RV S prime:     8.08 cm/s  IVC diam: 3.00 cm TAPSE (M-mode): 1.4 cm LEFT  ATRIUM             Index       RIGHT ATRIUM           Index LA diam:        3.80 cm 2.00 cm/m  RA Area:     17.30 cm LA Vol (A2C):   56.3 ml 29.70 ml/m RA Volume:   44.90 ml  23.69 ml/m LA Vol (A4C):   44.9 ml 23.69 ml/m LA Biplane Vol: 55.6 ml 29.33 ml/m  AORTIC VALVE LVOT Vmax:   129.00 cm/s LVOT Vmean:  86.900 cm/s LVOT VTI:    0.272 m  AORTA Ao Root diam: 3.40 cm Ao Asc diam:  3.40 cm MITRAL VALVE                TRICUSPID VALVE  MV Area (PHT): 4.15 cm     TR Peak grad:   21.2 mmHg MV Peak grad:  25.4 mmHg    TR Vmax:        230.00 cm/s MV Mean grad:  5.0 mmHg MV Vmax:       2.52 m/s     SHUNTS MV Vmean:      88.8 cm/s    Systemic VTI:  0.27 m MV Decel Time: 183 msec     Systemic Diam: 1.70 cm MV E velocity: 159.00 cm/s MV A velocity: 209.00 cm/s MV E/A ratio:  0.76 Rudean Haskell MD Electronically signed by Rudean Haskell MD Signature Date/Time: 10/09/2020/4:38:55 PM    Final    CT VENOGRAM ABD/PEL  Result Date: 10/30/2020 CLINICAL DATA:  History of DVT with IVC filter placement. Evaluate for central/caval thrombosis. EXAM: CT ABDOMEN AND PELVIS WITH CONTRAST TECHNIQUE: Multidetector CT imaging of the abdomen and pelvis was performed using the standard protocol following bolus administration of intravenous contrast. CONTRAST:  126mL OMNIPAQUE IOHEXOL 350 MG/ML SOLN COMPARISON:  CT abdomen pelvis-04/01/2018 nuclear medicine perfusion scan-10/11/2020 FINDINGS: Lower chest: Limited visualization of the lower thorax demonstrates chronic small right and trace left-sided effusions, similar to the 2019 examination. Improved aeration of the right lung base with persistent bibasilar consolidative opacities, likely atelectasis. Cardiomegaly. Exuberant calcifications about the mitral valve annulus. Coronary artery calcifications. No pericardial effusion. Hepatobiliary: There is mild diffuse decreased attenuation hepatic parenchyma, nonspecific though could be seen in the setting previous amiodarone  therapy. No discrete hepatic lesions. Post cholecystectomy. There is mild prominence of the extra and intrahepatic biliary system presumably secondary to biliary reservoir phenomena. No ascites. Pancreas: Pancreas appears largely atrophic. Spleen: Normal appearance of the spleen. Adrenals/Urinary Tract: There is symmetric enhancement and excretion of the bilateral kidneys. Note is made of an approximately 2.6 cm hypoattenuating cyst involving the inferior medial aspect of the right kidney. Left-sided hypoattenuating renal lesions are too small to accurately characterize though favored to represent additional renal cysts. No evidence of nephrolithiasis on this postcontrast examination. No urinary obstruction or perinephric stranding. There is mild thickening of the crux of the left adrenal gland without discrete nodule. Normal appearance of the right adrenal gland. Suspected mild thickening of the urinary bladder wall, potentially accentuated due to underdistention and suboptimally evaluated due to streak artifact from the patient's bilateral total hip prostheses. Stomach/Bowel: Moderate colonic stool burden without evidence of enteric obstruction. Normal appearance of the terminal ileum. The appendix is not visualized, however there is no pericecal inflammatory change. No evidence of enteric obstruction. No pneumoperitoneum, pneumatosis or portal venous gas. Vascular/Lymphatic: There is a large amount of predominantly calcified slightly irregular atherosclerotic plaque throughout the normal caliber abdominal aorta. Atherosclerotic plaque likely approaches 50% luminal narrowing involving the infrarenal abdominal aorta (axial image 24, series 3; sagittal image 83, series 4). The patient has undergone stenting involving the origin and proximal aspect of the SMA, the patency of which is suboptimally evaluated due to phase of enhancement as well as significant streak artifact from the patient's lumbar spine hardware.  Post IVC filter placement. There may be a small amount of nonocclusive thrombus within the conical portion of the IVC filter (axial image 31, series 3; coronal image 81, series 4) however this may be artifactual due to significant adjacent streak artifact. Otherwise, the IVC and pelvic venous systems appear widely patent. Evaluation of the bilateral common femoral veins is degraded secondary to significant streak artifact from the patient's bilateral total hip prostheses. Redemonstrated  mildly prominent bilateral inguinal lymph nodes with index left inguinal lymph node measuring 1.1 cm in greatest short axis diameter (image 71, series 3), similar to the 2019 examination and presumably reactive in etiology. No bulky retroperitoneal, mesenteric, pelvic or inguinal lymphadenopathy Reproductive: Evaluation degraded secondary streak artifact from the patient's bilateral total hip prostheses. Other: Redemonstrated peripherally calcified breast prostheses. A small amount of fluid is noted within a tiny midline ventral wall hernia which measures approximately 3.6 x 2.6 cm (sagittal image 90, series 5). Diffuse body wall anasarca. Musculoskeletal: Stable sequela of L1-L3 paraspinal fusion with L4 laminectomy and intervertebral disc space replacement of L1-L2, L2-L3, L3-L4, L4-L5. Fluid is again noted posterior to the spinal operative site. 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 Interval left total hip replacement. IMPRESSION: 1. Post IVC filter placement with possible small amount of nonocclusive thrombus within the conical portion of the IVC filter however this may be artifactual due to significant adjacent streak artifact from the patient's long segment paraspinal fusion hardware. Regardless, this is of doubtful clinical concern as the IVC and pelvic venous systems appear otherwise widely patent. 2. Moderate colonic stool burden without evidence of enteric obstruction. 3. Cardiomegaly with small right and trace  left-sided effusions, similar to the 2019 examination. 4. Improved aeration of the right lung base with persistent bibasilar consolidative opacities, likely atelectasis/scar. 5. Coronary calcifications.  Aortic Atherosclerosis (ICD10-I70.0). 6. Significant atherosclerotic plaque throughout the abdominal aorta including potential 50% luminal narrowing involving the infrarenal abdominal aorta and the sequela of previous stenting of the SMA, incompletely evaluated due to phase of contrast enhancement as well as significant streak artifact from the patient's long segment paraspinal fusion hardware. Electronically Signed   By: Sandi Mariscal M.D.   On: 10/30/2020 15:40   VAS [place] LOWER EXTREMITY VENOUS (DVT)  Result Date: 10/10/2020  Lower Venous DVTStudy Indications: Elevated ddimer.  Comparison Study: previous 11/03/18 Performing Technologist: Abram Sander RVS  Examination Guidelines: A complete evaluation includes B-mode imaging, spectral Doppler, color Doppler, and power Doppler as needed of all accessible portions of each vessel. Bilateral testing is considered an integral part of a complete examination. Limited examinations for reoccurring indications may be performed as noted. The reflux portion of the exam is performed with the patient in reverse Trendelenburg.  +---------+---------------+---------+-----------+----------+--------------+ RIGHT    CompressibilityPhasicitySpontaneityPropertiesThrombus Aging +---------+---------------+---------+-----------+----------+--------------+ CFV      Full           Yes      Yes                                 +---------+---------------+---------+-----------+----------+--------------+ SFJ      Full                                                        +---------+---------------+---------+-----------+----------+--------------+ FV Prox  Full                                                         +---------+---------------+---------+-----------+----------+--------------+ FV Mid   Full                                                        +---------+---------------+---------+-----------+----------+--------------+  FV DistalFull                                                        +---------+---------------+---------+-----------+----------+--------------+ PFV      Full                                                        +---------+---------------+---------+-----------+----------+--------------+ POP      None           Yes      Yes                  Chronic        +---------+---------------+---------+-----------+----------+--------------+ PTV      Full                                                        +---------+---------------+---------+-----------+----------+--------------+ PERO     Full                                                        +---------+---------------+---------+-----------+----------+--------------+   +---------+---------------+---------+-----------+----------+--------------+ LEFT     CompressibilityPhasicitySpontaneityPropertiesThrombus Aging +---------+---------------+---------+-----------+----------+--------------+ CFV      Full           Yes      Yes                                 +---------+---------------+---------+-----------+----------+--------------+ SFJ      Full                                                        +---------+---------------+---------+-----------+----------+--------------+ FV Prox  Full                                                        +---------+---------------+---------+-----------+----------+--------------+ FV Mid   Full                                                        +---------+---------------+---------+-----------+----------+--------------+ FV DistalFull                                                         +---------+---------------+---------+-----------+----------+--------------+  PFV      Full                                                        +---------+---------------+---------+-----------+----------+--------------+ POP      Full           Yes      Yes                                 +---------+---------------+---------+-----------+----------+--------------+ PTV      Full                                                        +---------+---------------+---------+-----------+----------+--------------+ PERO     Full                                                        +---------+---------------+---------+-----------+----------+--------------+     Summary: RIGHT: - Findings consistent with chronic deep vein thrombosis involving the right popliteal vein. - No cystic structure found in the popliteal fossa.  LEFT: - There is no evidence of deep vein thrombosis in the lower extremity.  - No cystic structure found in the popliteal fossa.  *See table(s) above for measurements and observations. Electronically signed by Harold Barban MD on 10/10/2020 at 7:40:41 PM.    Final    VAS [place] UPPER EXTREMITY VENOUS DUPLEX  Result Date: 10/10/2020 UPPER VENOUS STUDY  Indications: elevated ddimer Comparison Study: no prior Performing Technologist: Abram Sander RVS  Examination Guidelines: A complete evaluation includes B-mode imaging, spectral Doppler, color Doppler, and power Doppler as needed of all accessible portions of each vessel. Bilateral testing is considered an integral part of a complete examination. Limited examinations for reoccurring indications may be performed as noted.  Right Findings: +----------+------------+---------+-----------+----------+-------+ RIGHT     CompressiblePhasicitySpontaneousPropertiesSummary +----------+------------+---------+-----------+----------+-------+ IJV           Full       Yes       Yes                       +----------+------------+---------+-----------+----------+-------+ Subclavian    Full       Yes       Yes                      +----------+------------+---------+-----------+----------+-------+ Axillary      Full       Yes       Yes                      +----------+------------+---------+-----------+----------+-------+ Brachial      Full       Yes       Yes                      +----------+------------+---------+-----------+----------+-------+ Radial        Full                                          +----------+------------+---------+-----------+----------+-------+  Ulnar         Full                                          +----------+------------+---------+-----------+----------+-------+ Cephalic      Full                                          +----------+------------+---------+-----------+----------+-------+ Basilic       Full                                          +----------+------------+---------+-----------+----------+-------+  Left Findings: +----------+------------+---------+-----------+----------+-------+ LEFT      CompressiblePhasicitySpontaneousPropertiesSummary +----------+------------+---------+-----------+----------+-------+ IJV           Full       Yes       Yes                      +----------+------------+---------+-----------+----------+-------+ Subclavian    Full       Yes       Yes                      +----------+------------+---------+-----------+----------+-------+ Axillary      Full       Yes       Yes                      +----------+------------+---------+-----------+----------+-------+ Brachial      Full       Yes       Yes                      +----------+------------+---------+-----------+----------+-------+ Radial        Full                                          +----------+------------+---------+-----------+----------+-------+ Ulnar         Full                                           +----------+------------+---------+-----------+----------+-------+ Cephalic      Full                                          +----------+------------+---------+-----------+----------+-------+ Basilic       Full                                          +----------+------------+---------+-----------+----------+-------+  Summary: No evidence of deep vein or superficial vein thrombosis involving the right and left upper extremities.  *See table(s) above for measurements and observations.  Diagnosing physician: Harold Barban MD Electronically signed by Harold Barban MD on 10/10/2020 at 7:40:28 PM.    Final     Microbiology: Recent Results (from the past 240 hour(s))  Respiratory Panel by RT PCR (Flu A&B, Covid) - Nasopharyngeal Swab     Status: None   Collection Time: 10/28/20  6:59 AM   Specimen: Nasopharyngeal Swab  Result Value Ref Range Status   SARS Coronavirus 2 by RT PCR NEGATIVE NEGATIVE Final    Comment: (NOTE) SARS-CoV-2 target nucleic acids are NOT DETECTED.  The SARS-CoV-2 RNA is generally detectable in upper respiratoy specimens during the acute phase of infection. The lowest concentration of SARS-CoV-2 viral copies this assay can detect is 131 copies/mL. A negative result does not preclude SARS-Cov-2 infection and should not be used as the sole basis for treatment or other patient management decisions. A negative result may occur with  improper specimen collection/handling, submission of specimen other than nasopharyngeal swab, presence of viral mutation(s) within the areas targeted by this assay, and inadequate number of viral copies (<131 copies/mL). A negative result must be combined with clinical observations, patient history, and epidemiological information. The expected result is Negative.  Fact Sheet for Patients:  PinkCheek.be  Fact Sheet for Healthcare Providers:  GravelBags.it  This  test is no t yet approved or cleared by the [place] FDA and  has been authorized for detection and/or diagnosis of SARS-CoV-2 by FDA under an Emergency Use Authorization (EUA). This EUA will remain  in effect (meaning this test can be used) for the duration of the COVID-19 declaration under Section 564(b)(1) of the Act, 21 U.S.C. section 360bbb-3(b)(1), unless the authorization is terminated or revoked sooner.     Influenza A by PCR NEGATIVE NEGATIVE Final   Influenza B by PCR NEGATIVE NEGATIVE Final    Comment: (NOTE) The Xpert Xpress SARS-CoV-2/FLU/RSV assay is intended as an aid in  the diagnosis of influenza from Nasopharyngeal swab specimens and  should not be used as a sole basis for treatment. Nasal washings and  aspirates are unacceptable for Xpert Xpress SARS-CoV-2/FLU/RSV  testing.  Fact Sheet for Patients: PinkCheek.be  Fact Sheet for Healthcare Providers: GravelBags.it  This test is not yet approved or cleared by the [place] FDA and  has been authorized for detection and/or diagnosis of SARS-CoV-2 by  FDA under an Emergency Use Authorization (EUA). This EUA will remain  in effect (meaning this test can be used) for the duration of the  Covid-19 declaration under Section 564(b)(1) of the Act, 21  U.S.C. section 360bbb-3(b)(1), unless the authorization is  terminated or revoked. Performed at [facility], [place] [address], [place], [place] [address]      Labs: Basic Metabolic Panel: Recent Labs  Lab 10/29/20 0257 10/30/20 0249 10/31/20 0059 11/01/20 0103 11/02/20 0134  NA 139 141 141 141 141  K 4.0 3.8 3.7 4.3 4.2  CL 102 102 105 104 100  CO2 29 32 26 32 33*  GLUCOSE 101* 98 179* 115* 101*  BUN 20 12 11 14 13   CREATININE 1.24* 0.81 0.97 1.05* 0.93  CALCIUM 8.6* 8.9 8.7* 8.9 9.2   Liver Function Tests: No results for input(s): AST, ALT, ALKPHOS, BILITOT, PROT, ALBUMIN in the last  168 hours. No results for input(s): LIPASE, AMYLASE in the last 168 hours. No results for input(s): AMMONIA in the last 168 hours. CBC: Recent Labs  Lab 10/28/20 1138 10/29/20 0257 10/30/20 0249 10/31/20 0059 11/01/20 0103  WBC 8.3 6.5 6.0 4.1 7.8  HGB 9.9* 10.0* 9.8* 8.7* 8.6*  HCT 32.6* 32.9* 32.4* 28.5* 28.1*  MCV 95.3 96.2 96.4 96.0 94.6  PLT 227 182 179 175 189  Cardiac Enzymes: No results for input(s): CKTOTAL, CKMB, CKMBINDEX, TROPONINI in the last 168 hours. BNP: BNP (last 3 results) Recent Labs    10/09/20 1141 10/28/20 0108 10/29/20 0257  BNP 491.4* 339.0* 425.5*    ProBNP (last 3 results) Recent Labs    03/08/20 1158 09/10/20 1535  PROBNP 1,185* 745*    CBG: No results for input(s): GLUCAP in the last 168 hours.     Signed:  Domenic Polite MD.  [facility] 11/02/2020, 1:41 PM

## 2020-11-05 ENCOUNTER — Other Ambulatory Visit: Payer: Self-pay

## 2020-11-05 NOTE — Patient Outreach (Signed)
[facility]) Care Management  11/05/2020  [name] 04-02-1941 [id number]   Telephone call to patient's son. No answer HIPAA compliant voice message left.  Telephone call to patient's home number.  No answer.  Unable to leave a message.    Plan: RN CM will send letter and attempt again in the month of November.   [name], RN, MSN [facility] Care Management Coordinator Direct Line [phone] Cell [phone] Toll Free: [phone]  Fax: [phone]

## 2020-11-05 NOTE — Progress Notes (Deleted)
Cardiology Office Note   Date:  11/05/2020   ID:  [name], [name] 03, 1942, MRN [id number]  PCP:  [name], MD  Cardiologist: [name], MD  No chief complaint on file.     History of Present Illness: [name] is a 79 y.o. female who presents for hospital follow-up, seen for Dr. Harrington Challenger.  [name] has a history of CAD s/p CABG in 2004 with subsequent DES to the left main in 9326, chronic diastolic CHF, carotid artery disease, PAD s/p SMA stenting in 2019, recurrent DVT/PE s/p IVC filter placed 02/2018 not on anticoagulation due to GI bleed and prior large abdominal wall hematoma, COPD on home O2, cirrhosis, HTN, HLD, hypothyroidism, chronic low back pain and depression.   [name] is followed by Dr. Harrington Challenger for cardiology care.  She was seen 08/2020 at which time she noted chronic stable dyspnea with activity and some lower extremity edema however denied chest pain.  Dyspnea was felt to be multifactorial.  She presented to an urgent care on day of ED presentation for the evaluation of worsening shortness of breath, cough and fever.  O2 saturations were in the 70's on room air therefore she was transported to the ED for further evaluation.  High-sensitivity troponins were mildly elevated however flat at 36 with a repeat at 41.  BNP was elevated at 491.  CXR suspicious of bronchitis and pneumonia in the mid to lower lobes.  White count was elevated.  Respiratory panel was negative.  She was started on IV heparin given suspicion for PE as well as IV antibiotics for CAP per IM.  She was hypotensive requiring phenylephrine.  She was not diuresed immediately due to soft BPs and rising creatinine.  Renal function eventually began to improve therefore she was trialed on IV Lasix 40 mg x 1.  She remained stable therefore Lasix was transitioned to oral dosing.  At cardiology sign off, she was continued on home Lasix/metolazone regimen.  She was restarted on  lisinopril/spironolactone if BP and renal function allow with plans for close follow-up.    Acute on chronic hypoxic respiratory failure:  -thought to be multifactorial given known COPD on 4L home O2, pneumonia, and acute on chronic diastolic heart failure -renal function continues to improve, received IV lasix yesterday -COPD/pneumonia treatment per primary team  Acute on chronic diastolic CHF:  -EF preserved. -weight today 77.8 kg, peak 81.3 kg this admission -will change to oral lasix today -continue home diuretic regimen at discharge  CAD s/p CABG in 2004 with subsequent PCI/DES to LM in 2010:  -troponin trend not consistent with acute coronary syndrome -likely demand ischemic -Continue statin -started on aspirin this admission, would continue if no plans for anticoagulation  Recurrent PE/DVT s/p IVC filter: IVC filter placed in 2019 due to history of GI bleed and abdominal wall hematoma on anticoagulation in the past.   -no plans for anticoagulation -reports that she was not taking rivaroxaban prior to admission  HTN:  -no longer requiring pressors, but BP remains low normal -would hold on adding back additional medications today  Acute on chronic renal insufficiency: Improving -lisinopril currently on hold   Past Medical History:  Diagnosis Date  . Anemia    bld. transfusion post lumbar surgery- 2012  . Anxiety   . Arthralgia    NOS  . Blood transfusion 2012; 02/2018   WITH BACK SURGERY; "related to hematoma in stomach" (10/06/2018)  . CAD (coronary artery disease)  s/p CABG 2004; s/p DES to LM in 2010;  [place] 10/29/11: EF 50-55%, mild elevated filling pressures, no pulmonary HTN, LM 90% ISR, LAD and CFX occluded, S-RI occluded (old), S-OM3 ok and L-LAD ok, native nondominant RCA 95% -  med rx recommended ; Lexiscan Myoview 7/13 at [facility]: demonstrated "normal LV function, anterior attenuation and localized ischemia, inferior, basilar, mid section"  .  Carotid artery disease ([place])    Carotid US 0/34:  RICA 7-42; LICA 59-56; R subclavian stenosis - Repeat 1 year. // Carotid US 05/2019: R 1-39; L 40-59; R vertebral with atypical antegrade flow; R subclavian stenosis >> repeat 1 year // Carotid US 7/21: Bilat 40-59; L subclavian stenosis   . CHF (congestive heart failure) ([place]) 01/21/2020  . Chronic diastolic heart failure (HCC)    Echo 9/10: EF 38-75%, grade 1 diastolic dysfunction  . Chronic lower back pain   . COPD (chronic obstructive pulmonary disease) ([place])    Emphysema dxed by Dr. Woody Seller in [place] based on PFTs per pt in 2006; placed on albuterol  . Depression    TAKES CELEXA  AND  (OFF- WELBUTRIN)  . Diuretic-induced hypokalemia 10/08/2018  . DVT of lower extremity (deep venous thrombosis) (HCC)    recurrent. bilateral (2 episodes)  . Dyspnea    home o2 when needed  . Dysrhythmia    afib with cabg  . Exertional angina (HCC)    Treated with Isosorbide, Ranexa, amlodipine; intolerant to metoprolol  . GERD (gastroesophageal reflux disease)   . Gout    "on daily RX" (10/06/2018)  . HLD (hyperlipidemia)   . Hypertension   . Hyperthyroidism   . L Subclavian Artery Stenosis    Carotid US 7/21   . Obesity (BMI 30-39.9) 2009   BMI 33  . Osteoarthritis    "all over; hands" (10/06/2018)  . Oxygen deficiency   . Oxygen dependent    4L at home as needed (10/06/2018)  . Pneumonia   . PVD (peripheral vascular disease) ([place])   . Sleep apnea 2012   USED CPAP THEN  STARTED USING SPIRIVA , Nov. 2013- last evaluation , changed from mask to aparatus that is just for her nose.. ; reports 05-28-18 "the mask smothers me so i dont use it right now" (10/06/2018)    Past Surgical History:  Procedure Laterality Date  . ANKLE SURGERY  2004  . ANTERIOR CERVICAL DECOMP/DISCECTOMY FUSION  11/2007  . AUGMENTATION MAMMAPLASTY    . [place]   lower; another scheduled, opt. reports 4 back- lumbar, 3 cerv. fusions  for later 2009  . CARDIAC  CATHETERIZATION  11/2009   Patent LIMA to LAD and patent SVG to OM1. Occluded SVG to ramus and diagonal. Left main: 90% ostial stenosis, LCX 60-70% proximal stenosis  . CATARACT EXTRACTION W/ INTRAOCULAR LENS  IMPLANT, BILATERAL Bilateral 2006  . COLONOSCOPY WITH PROPOFOL N/A 02/17/2017   Procedure: COLONOSCOPY WITH PROPOFOL;  Surgeon: Jerene Bears, MD;  Location: [facility] ENDOSCOPY;  Service: Endoscopy;  Laterality: N/A;  . CORONARY ANGIOPLASTY WITH STENT PLACEMENT  11/2009   Drug eluting stent to left main artery: 4.0 X 12 mm Ion   . CORONARY ARTERY BYPASS GRAFT  2004   "CABG X4"  . ESOPHAGOGASTRODUODENOSCOPY N/A 02/14/2017   Procedure: ESOPHAGOGASTRODUODENOSCOPY (EGD);  Surgeon: Doran Stabler, MD;  Location: [facility] ENDOSCOPY;  Service: Endoscopy;  Laterality: N/A;  . GIVENS CAPSULE STUDY N/A 02/15/2017   Procedure: GIVENS CAPSULE STUDY;  Surgeon: Nelida Meuse III,  MD;  Location: [facility] ENDOSCOPY;  Service: Endoscopy;  Laterality: N/A;  . GROIN MASS OPEN BIOPSY  2004  . IR IVC FILTER PLMT / S&I /IMG GUID/MOD SED  03/14/2018  . IR PARACENTESIS  03/10/2018  . JOINT REPLACEMENT    . LAPAROSCOPIC CHOLECYSTECTOMY    . PERIPHERAL VASCULAR INTERVENTION Left 03/05/2018   Procedure: PERIPHERAL VASCULAR INTERVENTION;  Surgeon: Elam Dutch, MD;  Location: [place] CV LAB;  Service: Cardiovascular;  Laterality: Left;  Attempted unsuccess\ful Per Dr. Eden Lathe  . PERIPHERAL VASCULAR INTERVENTION  03/08/2018   Procedure: PERIPHERAL VASCULAR INTERVENTION;  Surgeon: Waynetta Sandy, MD;  Location: [place] CV [facility];  Service: Cardiovascular;;  SMA Stent   . REPLACEMENT TOTAL KNEE BILATERAL Bilateral 2012  . REVERSE SHOULDER ARTHROPLASTY  02/26/2012   Procedure: REVERSE SHOULDER ARTHROPLASTY;  Surgeon: Marin Shutter, MD;  Location: [place];  Service: Orthopedics;  Laterality: Right;  RIGHT SHOULDER REVERSED ARTHROPLASTY  . SHOULDER ARTHROSCOPY WITH SUBACROMIAL DECOMPRESSION Left 02/10/2013   Procedure:  SHOULDER ARTHROSCOPY WITH SUBACROMIAL DECOMPRESSION DISTAL CLAVICLE RESECTION;  Surgeon: Marin Shutter, MD;  Location: [place];  Service: Orthopedics;  Laterality: Left;  DISTAL CLAVICLE RESECTION  . TONSILLECTOMY    . TOTAL HIP ARTHROPLASTY  2007   Right  . TOTAL HIP ARTHROPLASTY Left 02/07/2020   Procedure: TOTAL HIP ARTHROPLASTY ANTERIOR APPROACH;  Surgeon: Paralee Cancel, MD;  Location: [facility];  Service: Orthopedics;  Laterality: Left;  70 mins  . TRANSURETHRAL RESECTION OF BLADDER TUMOR N/A 05/31/2018   Procedure: TRANSURETHRAL RESECTION OF BLADDER TUMOR (TURBT);  Surgeon: Lucas Mallow, MD;  Location: [facility];  Service: Urology;  Laterality: N/A;  . VISCERAL ANGIOGRAPHY N/A 03/05/2018   Procedure: VISCERAL ANGIOGRAPHY;  Surgeon: Elam Dutch, MD;  Location: [place] [facility];  Service: Cardiovascular;  Laterality: N/A;  . VISCERAL ANGIOGRAPHY N/A 03/08/2018   Procedure: VISCERAL ANGIOGRAPHY;  Surgeon: Waynetta Sandy, MD;  Location: [place] [facility];  Service: Cardiovascular;  Laterality: N/A;     Current Outpatient Medications  Medication Sig Dispense Refill  . allopurinol (ZYLOPRIM) 100 MG tablet Take 100 mg by mouth daily.    . ARIPiprazole (ABILIFY) 2 MG tablet Take 2 mg by mouth daily.    Marland Kitchen ascorbic acid (VITAMIN C) 500 MG tablet Take 500 mg by mouth daily.    Marland Kitchen aspirin 81 MG chewable tablet Chew 1 tablet (81 mg total) by mouth daily. 30 tablet 0  . atorvastatin (LIPITOR) 20 MG tablet Take 20 mg by mouth daily.    . citalopram (CELEXA) 40 MG tablet Take 40 mg by mouth at bedtime.    . cyclobenzaprine (FLEXERIL) 10 MG tablet Take 10 mg by mouth 3 (three) times daily as needed for muscle spasms.     . ferrous sulfate (FERROUSUL) 325 (65 FE) MG tablet Take 1 tablet (325 mg total) by mouth 3 (three) times daily with meals for 14 days. (Patient taking differently: Take 325 mg by mouth daily. ) 42 tablet 0  . furosemide (LASIX) 40 MG tablet Take 1 tablet (40 mg total) by  mouth 2 (two) times daily. Take two tablets by mouth every Monday and Thursday afternoon.  Take one tablet all other afternoons.    Marland Kitchen gabapentin (NEURONTIN) 100 MG capsule Take 100 mg by mouth daily.    Marland Kitchen guaiFENesin (MUCINEX) 600 MG 12 hr tablet Take 600 mg by mouth 2 (two) times daily.     . INCRUSE ELLIPTA 62.5 MCG/INH AEPB Inhale 1  puff into the lungs 2 (two) times daily.     . Ipratropium-Albuterol (COMBIVENT) 20-100 MCG/ACT AERS respimat Inhale 1 puff into the lungs every 6 (six) hours as needed for wheezing or shortness of breath (or to help expel mucous).     Marland Kitchen levothyroxine (SYNTHROID) 50 MCG tablet Take 1 tablet (50 mcg total) by mouth daily at 6 (six) AM. 90 tablet 3  . magnesium oxide (MAG-OX) 400 (241.3 Mg) MG tablet Take 1 tablet (400 mg total) by mouth 2 (two) times daily.    . Melatonin 10 MG TABS Take 10 mg by mouth at bedtime.     Marland Kitchen neomycin-polymyxin-hydrocortisone (CORTISPORIN) 3.5-10000-1 OTIC suspension Place 5 drops into the right ear daily as needed (ear pain).     . nitroGLYCERIN (NITROSTAT) 0.4 MG SL tablet Place 1 tablet (0.4 mg total) under the tongue every 5 (five) minutes as needed for chest pain. For chest pain 25 tablet 3  . ofloxacin (FLOXIN) 0.3 % OTIC solution Place 2 drops into the left ear daily.     Marland Kitchen omeprazole (PRILOSEC) 40 MG capsule Take 1 capsule (40 mg total) by mouth 2 (two) times daily. (Patient taking differently: Take 40 mg by mouth every evening. ) 180 capsule 3  . OXYGEN Inhale 4 L/min into the lungs as needed (DURING ALL TIMES OF EXERTION).     Marland Kitchen pilocarpine (PILOCAR) 1 % ophthalmic solution Place 1 drop into both eyes 2 (two) times daily.    . polyethylene glycol (MIRALAX / GLYCOLAX) 17 g packet Take 17 g by mouth 2 (two) times daily. (Patient taking differently: Take 17 g by mouth daily. ) 28 packet 0  . potassium chloride SA (KLOR-CON) 20 MEQ tablet Take 20 mEq by mouth daily.    . pramipexole (MIRAPEX) 0.25 MG tablet Take 0.25 mg by mouth every  evening.     . roflumilast (DALIRESP) 500 MCG TABS tablet Take 500 mcg by mouth every evening.     Marland Kitchen SPIRIVA RESPIMAT 1.25 MCG/ACT AERS Inhale 2 puffs into the lungs daily.     . traZODone (DESYREL) 50 MG tablet Take 150 mg by mouth at bedtime.     Marland Kitchen XTAMPZA ER 9 MG C12A Take 9 mg by mouth every 8 (eight) hours.     No current facility-administered medications for this visit.    Allergies:   Ativan [lorazepam], Pitavastatin, Ropinirole, Zofran [ondansetron hcl], Zolpidem tartrate, and Penicillins    Social History:  The patient  reports that she quit smoking about 19 years ago. Her smoking use included cigarettes. She has a 45.00 pack-year smoking history. She has never used smokeless tobacco. She reports that she does not drink alcohol and does not use drugs.   Family History:  The patient's ***family history includes COPD in her father; Colitis in her sister; Heart disease in her father and sister; Lung cancer in her father; Ovarian cancer in her mother; Stomach cancer in her sister.    ROS:  Please see the history of present illness.   Otherwise, review of systems are positive for {NONE DEFAULTED:18576::"none"}.   All other systems are reviewed and negative.    PHYSICAL EXAM: VS:  There were no vitals taken for this visit. , BMI There is no height or weight on file to calculate BMI. GEN: Well nourished, well developed, in no acute distress HEENT: normal Neck: no JVD, carotid bruits, or masses Cardiac: ***RRR; no murmurs, rubs, or gallops,no edema  Respiratory:  clear to auscultation bilaterally, normal work  of breathing GI: soft, nontender, nondistended, + BS MS: no deformity or atrophy Skin: warm and dry, no rash Neuro:  Strength and sensation are intact Psych: euthymic mood, full affect   EKG:  EKG {ACTION; IS/IS QGB:20100712} ordered today. The ekg ordered today demonstrates ***   Recent Labs: 09/10/2020: NT-Pro BNP 745; TSH 4.060 10/13/2020: ALT 26; Magnesium  2.1 10/29/2020: B Natriuretic Peptide 425.5 11/01/2020: Hemoglobin 8.6; Platelets 189 11/02/2020: BUN 13; Creatinine, Ser 0.93; Potassium 4.2; Sodium 141    Lipid Panel    Component Value Date/Time   CHOL 89 (L) 03/08/2020 1158   CHOL 98 05/23/2014 0857   TRIG 76 03/08/2020 1158   TRIG 83 05/23/2014 0857   HDL 37 (L) 03/08/2020 1158   HDL 35 (L) 05/23/2014 0857   CHOLHDL 2.4 03/08/2020 1158   CHOLHDL 4 03/03/2013 1136   VLDL 24.4 03/03/2013 1136   LDLCALC 36 03/08/2020 1158   LDLCALC 46 05/23/2014 0857      Wt Readings from Last 3 Encounters:  11/01/20 180 lb 13.9 oz (82 kg)  10/13/20 171 lb 8.3 oz (77.8 kg)  09/10/20 166 lb (75.3 kg)      Other studies Reviewed: Additional studies/ records that were reviewed today include: ***. Review of the above records demonstrates: ***   Echocardiogram 10/09/2020: Impressions: 1. Diastology suggestive of impaired LV filling. Left ventricular  ejection fraction, by estimation, is 60 to 65%. The left ventricle has  normal function. The left ventricle has no regional wall motion  abnormalities. There is moderate concentric left  ventricular hypertrophy. Left ventricular diastolic parameters are  indeterminate.  2. Right ventricular systolic function is low normal. The right  ventricular size is normal. There is normal pulmonary artery systolic  pressure.  3. Left atrial size was moderately dilated.  4. Right atrial size was moderately dilated.  5. The mitral valve is myxomatous. No evidence of mitral valve  regurgitation. The mean mitral valve gradient is 5.0 mmHg with average  heart rate of 87 bpm. Moderate mitral annular calcification.  6. The aortic valve is tricuspid. Aortic valve regurgitation is not  visualized.  7. The inferior vena cava is dilated in size with <50% respiratory  variability, suggesting right atrial pressure of 15 mmHg.   Comparison(s): A prior study was performed on 10/05/2019. No significant   change from prior study. Subtle decrease in RV function.     ASSESSMENT AND PLAN:  1.  ***   Current medicines are reviewed at length with the patient today.  The patient {ACTIONS; HAS/DOES NOT HAVE:19233} concerns regarding medicines.  The following changes have been made:  {PLAN; NO CHANGE:13088:s}  Labs/ tests ordered today include: *** No orders of the defined types were placed in this encounter.    Disposition:   FU with *** in {gen number 1-97:588325} {Days to years:10300}  Signed, Kathyrn Drown, NP  11/05/2020 8:15 AM    [facility] [address], [place], [place]  [address] Phone: [phone]; Fax: [phone]

## 2020-11-06 ENCOUNTER — Encounter: Payer: Self-pay | Admitting: Cardiology

## 2020-11-06 ENCOUNTER — Ambulatory Visit (INDEPENDENT_AMBULATORY_CARE_PROVIDER_SITE_OTHER): Payer: Medicare HMO | Admitting: Cardiology

## 2020-11-06 ENCOUNTER — Other Ambulatory Visit: Payer: Self-pay

## 2020-11-06 VITALS — BP 140/62 | HR 77 | Ht 68.0 in | Wt 172.0 lb

## 2020-11-06 DIAGNOSIS — J449 Chronic obstructive pulmonary disease, unspecified: Secondary | ICD-10-CM

## 2020-11-06 DIAGNOSIS — I2699 Other pulmonary embolism without acute cor pulmonale: Secondary | ICD-10-CM

## 2020-11-06 DIAGNOSIS — I10 Essential (primary) hypertension: Secondary | ICD-10-CM | POA: Diagnosis not present

## 2020-11-06 DIAGNOSIS — E785 Hyperlipidemia, unspecified: Secondary | ICD-10-CM

## 2020-11-06 DIAGNOSIS — I5032 Chronic diastolic (congestive) heart failure: Secondary | ICD-10-CM | POA: Diagnosis not present

## 2020-11-06 DIAGNOSIS — I2510 Atherosclerotic heart disease of native coronary artery without angina pectoris: Secondary | ICD-10-CM

## 2020-11-06 DIAGNOSIS — N179 Acute kidney failure, unspecified: Secondary | ICD-10-CM

## 2020-11-06 NOTE — Progress Notes (Signed)
Cardiology Office Note   Date:  11/06/2020   ID:  [name], [name] 04/05/1941, MRN [id number]  PCP:  Judy Roger, MD  Cardiologist:  Dr. Harrington Challenger, MD   Chief Complaint  Patient presents with  . Hospitalization Follow-up    History of Present Illness: [name] is a 79 y.o. female who presents for hospital follow up, seen for Dr. Harrington Challenger.   [name] has a history of CAD s/p CABG in 2004 with subsequent DES to the left main in 2683, chronic diastolic CHF, carotid artery disease, PAD s/p SMA stenting in 2019, recurrent DVT/PE s/p IVC filter placed 02/2018 not on anticoagulation due to GI bleed and prior large abdominal wall hematoma, COPD on home O2, cirrhosis, HTN, HLD, hypothyroidism, chronic low back pain and depression.  She is followed by Dr. Harrington Challenger for cardiology care.  She was seen 08/2020 at which time she noted chronic stable dyspnea with activity and some lower extremity edema however denied chest pain. Dyspnea was felt to be multifactorial.  She presented to an urgent care on day of ED presentation for the evaluation of worsening shortness of breath, cough and fever. O2 saturations were in the 70's on room air therefore she was transported to the ED for further evaluation. High-sensitivity troponins were mildly elevated however flat at 36 with a repeat at 41.  BNP was elevated at 491.  CXR suspicious of bronchitis and pneumonia in the mid to lower lobes. White count was elevated.  Respiratory panel was negative.  She was started on IV heparin given suspicion for PE as well as IV antibiotics for CAP per IM.  She was hypotensive requiring phenylephrine.  She was not diuresed immediately due to soft BPs and rising creatinine.  Renal function eventually began to improve therefore she was trialed on IV Lasix 40 mg x 1.  She remained stable therefore Lasix was transitioned to oral dosing.  At cardiology sign off, she was continued on home Lasix/metolazone regimen.  She  was restarted on lisinopril/spironolactone if BP and renal function allow with plans for close follow-up.  Today she presents for follow-up with her daughter.  She reports feeling well since hospital discharge.  She lives at home with her son. She walks with a walker and a cane for ambulation.  She is chronically dyspneic on exertion however has improved since discharge.  LE edema remains but this also has improved greatly.  She was discharged only on 40 mg of Lasix p.o. daily therefore on office visit with her PCP, this was increased back to her prior regimen.  She has follow-up lab work on Thursday and follow-up appointment next Tuesday.  Agree with restarting prior Lasix dosing.  Her metolazone was discontinued on hospital discharge for AKI.  She denies chest pain, palpitations, new SOB or DOE, no dizziness or presyncopal symptoms.  Past Medical History:  Diagnosis Date  . Anemia    bld. transfusion post lumbar surgery- 2012  . Anxiety   . Arthralgia    NOS  . Blood transfusion 2012; 02/2018   WITH BACK SURGERY; "related to hematoma in stomach" (10/06/2018)  . CAD (coronary artery disease)    s/p CABG 2004; s/p DES to LM in 2010;  [place] 10/29/11: EF 50-55%, mild elevated filling pressures, no pulmonary HTN, LM 90% ISR, LAD and CFX occluded, S-RI occluded (old), S-OM3 ok and L-LAD ok, native nondominant RCA 95% -  med rx recommended ; Lexiscan Myoview 7/13 at [facility]: demonstrated "  normal LV function, anterior attenuation and localized ischemia, inferior, basilar, mid section"  . Carotid artery disease ([place])    Carotid US 0/25:  RICA 4-27; LICA 06-23; R subclavian stenosis - Repeat 1 year. // Carotid US 05/2019: R 1-39; L 40-59; R vertebral with atypical antegrade flow; R subclavian stenosis >> repeat 1 year // Carotid US 7/21: Bilat 40-59; L subclavian stenosis   . CHF (congestive heart failure) ([place]) 01/21/2020  . Chronic diastolic heart failure (HCC)    Echo 9/10: EF 60-55%, grade 1  diastolic dysfunction  . Chronic lower back pain   . COPD (chronic obstructive pulmonary disease) ([place])    Emphysema dxed by Dr. Woody Seller in [place] based on PFTs per pt in 2006; placed on albuterol  . Depression    TAKES CELEXA  AND  (OFF- WELBUTRIN)  . Diuretic-induced hypokalemia 10/08/2018  . DVT of lower extremity (deep venous thrombosis) (HCC)    recurrent. bilateral (2 episodes)  . Dyspnea    home o2 when needed  . Dysrhythmia    afib with cabg  . Exertional angina (HCC)    Treated with Isosorbide, Ranexa, amlodipine; intolerant to metoprolol  . GERD (gastroesophageal reflux disease)   . Gout    "on daily RX" (10/06/2018)  . HLD (hyperlipidemia)   . Hypertension   . Hyperthyroidism   . L Subclavian Artery Stenosis    Carotid US 7/21   . Obesity (BMI 30-39.9) 2009   BMI 33  . Osteoarthritis    "all over; hands" (10/06/2018)  . Oxygen deficiency   . Oxygen dependent    4L at home as needed (10/06/2018)  . Pneumonia   . PVD (peripheral vascular disease) ([place])   . Sleep apnea 2012   USED CPAP THEN  STARTED USING SPIRIVA , Nov. 2013- last evaluation , changed from mask to aparatus that is just for her nose.. ; reports 05-28-18 "the mask smothers me so i dont use it right now" (10/06/2018)    Past Surgical History:  Procedure Laterality Date  . ANKLE SURGERY  2004  . ANTERIOR CERVICAL DECOMP/DISCECTOMY FUSION  11/2007  . AUGMENTATION MAMMAPLASTY    . [place]   lower; another scheduled, opt. reports 4 back- lumbar, 3 cerv. fusions  for later 2009  . CARDIAC CATHETERIZATION  11/2009   Patent LIMA to LAD and patent SVG to OM1. Occluded SVG to ramus and diagonal. Left main: 90% ostial stenosis, LCX 60-70% proximal stenosis  . CATARACT EXTRACTION W/ INTRAOCULAR LENS  IMPLANT, BILATERAL Bilateral 2006  . COLONOSCOPY WITH PROPOFOL N/A 02/17/2017   Procedure: COLONOSCOPY WITH PROPOFOL;  Surgeon: Jerene Bears, MD;  [facility] ENDOSCOPY;  Service: Endoscopy;  Laterality:  N/A;  . CORONARY ANGIOPLASTY WITH STENT PLACEMENT  11/2009   Drug eluting stent to left main artery: 4.0 X 12 mm Ion   . CORONARY ARTERY BYPASS GRAFT  2004   "CABG X4"  . ESOPHAGOGASTRODUODENOSCOPY N/A 02/14/2017   Procedure: ESOPHAGOGASTRODUODENOSCOPY (EGD);  Surgeon: Doran Stabler, MD;  Location: [facility] ENDOSCOPY;  Service: Endoscopy;  Laterality: N/A;  . GIVENS CAPSULE STUDY N/A 02/15/2017   Procedure: GIVENS CAPSULE STUDY;  Surgeon: Doran Stabler, MD;  Location: [place];  Service: Endoscopy;  Laterality: N/A;  . GROIN MASS OPEN BIOPSY  2004  . IR IVC FILTER PLMT / S&I /IMG GUID/MOD SED  03/14/2018  . IR PARACENTESIS  03/10/2018  . JOINT REPLACEMENT    . LAPAROSCOPIC CHOLECYSTECTOMY    . PERIPHERAL  VASCULAR INTERVENTION Left 03/05/2018   Procedure: PERIPHERAL VASCULAR INTERVENTION;  Surgeon: Elam Dutch, MD;  Location: [place] CV LAB;  Service: Cardiovascular;  Laterality: Left;  Attempted unsuccess\ful Per Dr. Eden Lathe  . PERIPHERAL VASCULAR INTERVENTION  03/08/2018   Procedure: PERIPHERAL VASCULAR INTERVENTION;  Surgeon: Waynetta Sandy, MD;  Location: [place] CV [facility];  Service: Cardiovascular;;  SMA Stent   . REPLACEMENT TOTAL KNEE BILATERAL Bilateral 2012  . REVERSE SHOULDER ARTHROPLASTY  02/26/2012   Procedure: REVERSE SHOULDER ARTHROPLASTY;  Surgeon: Marin Shutter, MD;  Location: [place];  Service: Orthopedics;  Laterality: Right;  RIGHT SHOULDER REVERSED ARTHROPLASTY  . SHOULDER ARTHROSCOPY WITH SUBACROMIAL DECOMPRESSION Left 02/10/2013   Procedure: SHOULDER ARTHROSCOPY WITH SUBACROMIAL DECOMPRESSION DISTAL CLAVICLE RESECTION;  Surgeon: Marin Shutter, MD;  Location: [place];  Service: Orthopedics;  Laterality: Left;  DISTAL CLAVICLE RESECTION  . TONSILLECTOMY    . TOTAL HIP ARTHROPLASTY  2007   Right  . TOTAL HIP ARTHROPLASTY Left 02/07/2020   Procedure: TOTAL HIP ARTHROPLASTY ANTERIOR APPROACH;  Surgeon: Paralee Cancel, MD;  Location: [facility];  Service:  Orthopedics;  Laterality: Left;  70 mins  . TRANSURETHRAL RESECTION OF BLADDER TUMOR N/A 05/31/2018   Procedure: TRANSURETHRAL RESECTION OF BLADDER TUMOR (TURBT);  Surgeon: Lucas Mallow, MD;  Location: [facility];  Service: Urology;  Laterality: N/A;  . VISCERAL ANGIOGRAPHY N/A 03/05/2018   Procedure: VISCERAL ANGIOGRAPHY;  Surgeon: Elam Dutch, MD;  Location: [place] [facility];  Service: Cardiovascular;  Laterality: N/A;  . VISCERAL ANGIOGRAPHY N/A 03/08/2018   Procedure: VISCERAL ANGIOGRAPHY;  Surgeon: Waynetta Sandy, MD;  Location: [place] [facility];  Service: Cardiovascular;  Laterality: N/A;     Current Outpatient Medications  Medication Sig Dispense Refill  . allopurinol (ZYLOPRIM) 100 MG tablet Take 100 mg by mouth daily.    . ARIPiprazole (ABILIFY) 2 MG tablet Take 2 mg by mouth daily.    Marland Kitchen ascorbic acid (VITAMIN C) 500 MG tablet Take 500 mg by mouth daily.    Marland Kitchen aspirin 81 MG chewable tablet Chew 1 tablet (81 mg total) by mouth daily. 30 tablet 0  . atorvastatin (LIPITOR) 20 MG tablet Take 20 mg by mouth daily.    . citalopram (CELEXA) 40 MG tablet Take 40 mg by mouth at bedtime.    . cyclobenzaprine (FLEXERIL) 10 MG tablet Take 10 mg by mouth 3 (three) times daily as needed for muscle spasms.     . ferrous sulfate (FERROUSUL) 325 (65 FE) MG tablet Take 1 tablet (325 mg total) by mouth 3 (three) times daily with meals for 14 days. 42 tablet 0  . furosemide (LASIX) 40 MG tablet Take 1 tablet (40 mg total) by mouth 2 (two) times daily. Take two tablets by mouth every Monday and Thursday afternoon.  Take one tablet all other afternoons.    Marland Kitchen gabapentin (NEURONTIN) 100 MG capsule Take 100 mg by mouth daily.    Marland Kitchen guaiFENesin (MUCINEX) 600 MG 12 hr tablet Take 600 mg by mouth 2 (two) times daily.     . INCRUSE ELLIPTA 62.5 MCG/INH AEPB Inhale 1 puff into the lungs 2 (two) times daily.     . Ipratropium-Albuterol (COMBIVENT) 20-100 MCG/ACT AERS respimat Inhale 1 puff into  the lungs every 6 (six) hours as needed for wheezing or shortness of breath (or to help expel mucous).     Marland Kitchen levothyroxine (SYNTHROID) 50 MCG tablet Take 1 tablet (50 mcg total) by mouth daily at 6 (  six) AM. 90 tablet 3  . magnesium oxide (MAG-OX) 400 (241.3 Mg) MG tablet Take 1 tablet (400 mg total) by mouth 2 (two) times daily.    . Melatonin 10 MG TABS Take 10 mg by mouth at bedtime.     Marland Kitchen neomycin-polymyxin-hydrocortisone (CORTISPORIN) 3.5-10000-1 OTIC suspension Place 5 drops into the right ear daily as needed (ear pain).     . nitroGLYCERIN (NITROSTAT) 0.4 MG SL tablet Place 1 tablet (0.4 mg total) under the tongue every 5 (five) minutes as needed for chest pain. For chest pain 25 tablet 3  . ofloxacin (FLOXIN) 0.3 % OTIC solution Place 2 drops into the left ear daily.     Marland Kitchen omeprazole (PRILOSEC) 40 MG capsule Take 1 capsule (40 mg total) by mouth 2 (two) times daily. 180 capsule 3  . OXYGEN Inhale 4 L/min into the lungs as needed (DURING ALL TIMES OF EXERTION).     Marland Kitchen pilocarpine (PILOCAR) 1 % ophthalmic solution Place 1 drop into both eyes 2 (two) times daily.    . polyethylene glycol (MIRALAX / GLYCOLAX) 17 g packet Take 17 g by mouth 2 (two) times daily. 28 packet 0  . potassium chloride SA (KLOR-CON) 20 MEQ tablet Take 20 mEq by mouth daily.    . pramipexole (MIRAPEX) 0.25 MG tablet Take 0.25 mg by mouth every evening.     . roflumilast (DALIRESP) 500 MCG TABS tablet Take 500 mcg by mouth every evening.     Marland Kitchen SPIRIVA RESPIMAT 1.25 MCG/ACT AERS Inhale 2 puffs into the lungs daily.     Marland Kitchen spironolactone (ALDACTONE) 25 MG tablet Take 25 mg by mouth daily.    . traZODone (DESYREL) 50 MG tablet Take 150 mg by mouth at bedtime.     Marland Kitchen XTAMPZA ER 9 MG C12A Take 9 mg by mouth every 8 (eight) hours.     No current facility-administered medications for this visit.    Allergies:   Ativan [lorazepam], Pitavastatin, Ropinirole, Zofran [ondansetron hcl], Zolpidem tartrate, and Penicillins     Social History:  The patient  reports that she quit smoking about 19 years ago. Her smoking use included cigarettes. She has a 45.00 pack-year smoking history. She has never used smokeless tobacco. She reports that she does not drink alcohol and does not use drugs.   Family History:  The patient's family history includes COPD in her father; Colitis in her sister; Heart disease in her father and sister; Lung cancer in her father; Ovarian cancer in her mother; Stomach cancer in her sister.    ROS:  Please see the history of present illness.Otherwise, review of systems are positive for none.   All other systems are reviewed and negative.    PHYSICAL EXAM: VS:  BP 140/62   Pulse 77   Ht 5\' 8"  (1.727 m)   Wt 172 lb (78 kg)   SpO2 96%   BMI 26.15 kg/m  , BMI Body mass index is 26.15 kg/m.  General: Well developed, well nourished, NAD Neck: Negative for carotid bruits. No JVD Lungs: Diminished in bilateral lower lobes. Breathing is unlabored. Cardiovascular: RRR with S1 S2. No murmurs Extremities: No edema.  Radial pulses 2+ bilaterally Neuro: Alert and oriented. No focal deficits. No facial asymmetry. MAE spontaneously. Psych: Responds to questions appropriately with normal affect.     EKG:  EKG is not ordered today.  Recent Labs: 09/10/2020: NT-Pro BNP 745; TSH 4.060 10/13/2020: ALT 26; Magnesium 2.1 10/29/2020: B Natriuretic Peptide 425.5 11/01/2020: Hemoglobin 8.6;  Platelets 189 11/02/2020: BUN 13; Creatinine, Ser 0.93; Potassium 4.2; Sodium 141    Lipid Panel    Component Value Date/Time   CHOL 89 (L) 03/08/2020 1158   CHOL 98 05/23/2014 0857   TRIG 76 03/08/2020 1158   TRIG 83 05/23/2014 0857   HDL 37 (L) 03/08/2020 1158   HDL 35 (L) 05/23/2014 0857   CHOLHDL 2.4 03/08/2020 1158   CHOLHDL 4 03/03/2013 1136   VLDL 24.4 03/03/2013 1136   LDLCALC 36 03/08/2020 1158   LDLCALC 46 05/23/2014 0857     Wt Readings from Last 3 Encounters:  11/06/20 172 lb (78 kg)   11/01/20 180 lb 13.9 oz (82 kg)  10/13/20 171 lb 8.3 oz (77.8 kg)     Other studies Reviewed: Additional studies/ records that were reviewed today include:  Review of the above records demonstrates:   Echocardiogram 10/09/2020: Impressions: 1. Diastology suggestive of impaired LV filling. Left ventricular  ejection fraction, by estimation, is 60 to 65%. The left ventricle has  normal function. The left ventricle has no regional wall motion  abnormalities. There is moderate concentric left  ventricular hypertrophy. Left ventricular diastolic parameters are  indeterminate.  2. Right ventricular systolic function is low normal. The right  ventricular size is normal. There is normal pulmonary artery systolic  pressure.  3. Left atrial size was moderately dilated.  4. Right atrial size was moderately dilated.  5. The mitral valve is myxomatous. No evidence of mitral valve  regurgitation. The mean mitral valve gradient is 5.0 mmHg with average  heart rate of 87 bpm. Moderate mitral annular calcification.  6. The aortic valve is tricuspid. Aortic valve regurgitation is not  visualized.  7. The inferior vena cava is dilated in size with <50% respiratory  variability, suggesting right atrial pressure of 15 mmHg.   Comparison(s): A prior study was performed on 10/05/2019. No significant  change from prior study. Subtle decrease in RV function.    ASSESSMENT AND PLAN:  1.  Acute on chronic diastolic CHF: -Patient received IV Lasix during her hospital course and was discharged on 40 mg p.o. daily and was seen by her PCP this week who uptitrated her Lasix to prior dosing with close follow-up.  Agree with this plan as she continues to have some bilateral edema however has much improved since her hospitalization per patient and daughter report.  -Weight is up at 172lb with at baseline, dry weight of approximately 166lb -Continue current p.o. Lasix dosing at 40mg  in AM and 80mg  in the  afternoon on Mondays and Thursdays only, all other afternoons she takes 40mg      2.  CAD s/p CABG in 2004 with subsequent PCI/DES to LM in 2010: -Had mild troponin elevation during hospital course however no chest pain and elevation felt to be secondary to demand ischemia due to hypoxia and fluid volume overload  -No chest pain   3.  Recurrent PE/DVT s/p IVC filter: -IVC filter placed in 2019 due to history of GI bleed and abdominal wall hematoma on anticoagulation in the past.  -No plans for anticoagulation  4.  HTN: -Stable, 140/62  5. Chronic lung disease: -On home O2  Current medicines are reviewed at length with the patient today.  The patient does not have concerns regarding medicines.  The following changes have been made:  no change  Labs/ tests ordered today include: None  No orders of the defined types were placed in this encounter.  Disposition:   FU with Dr.  Ross in 3 months  Signed, Kathyrn Drown, NP  11/06/2020 12:13 PM    [facility] [address] [place], [place], [place]  [address] Phone: [phone]; Fax: [phone]

## 2020-11-06 NOTE — Patient Instructions (Signed)
Medication Instructions:  Your physician recommends that you continue on your current medications as directed. Please refer to the Current Medication list given to you today.  *If you need a refill on your cardiac medications before your next appointment, please call your pharmacy*   Lab Work: NONE ORDERED  If you have labs (blood work) drawn today and your tests are completely normal, you will receive your results only by: Marland Kitchen MyChart Message (if you have MyChart) OR . A paper copy in the mail If you have any lab test that is abnormal or we need to change your treatment, we will call you to review the results.   Testing/Procedures: NONE ORDERED   Follow-Up: At [facility], you and your health needs are our priority.  As part of our continuing mission to provide you with exceptional heart care, we have created designated Provider Care Teams.  These Care Teams include your primary Cardiologist (physician) and Advanced Practice Providers (APPs -  Physician Assistants and Nurse Practitioners) who all work together to provide you with the care you need, when you need it.  We recommend signing up for the patient portal called "MyChart".  Sign up information is provided on this After Visit Summary.  MyChart is used to connect with patients for Virtual Visits (Telemedicine).  Patients are able to view lab/test results, encounter notes, upcoming appointments, etc.  Non-urgent messages can be sent to your provider as well.   To learn more about what you can do with MyChart, go to [email or web address].    Your next appointment:   3 month(s)  The format for your next appointment:   In Person  Provider:   You may see Dorris Carnes, MD or one of the following Advanced Practice Providers on your designated Care Team:    [name], PA-C  [place], [place]

## 2020-11-07 ENCOUNTER — Other Ambulatory Visit: Payer: Self-pay

## 2020-11-07 DIAGNOSIS — M1030 Gout due to renal impairment, unspecified site: Secondary | ICD-10-CM | POA: Diagnosis not present

## 2020-11-07 DIAGNOSIS — I088 Other rheumatic multiple valve diseases: Secondary | ICD-10-CM | POA: Diagnosis not present

## 2020-11-07 DIAGNOSIS — I0981 Rheumatic heart failure: Secondary | ICD-10-CM | POA: Diagnosis not present

## 2020-11-07 DIAGNOSIS — J439 Emphysema, unspecified: Secondary | ICD-10-CM | POA: Diagnosis not present

## 2020-11-07 DIAGNOSIS — I5033 Acute on chronic diastolic (congestive) heart failure: Secondary | ICD-10-CM | POA: Diagnosis not present

## 2020-11-07 DIAGNOSIS — D631 Anemia in chronic kidney disease: Secondary | ICD-10-CM | POA: Diagnosis not present

## 2020-11-07 DIAGNOSIS — I25118 Atherosclerotic heart disease of native coronary artery with other forms of angina pectoris: Secondary | ICD-10-CM | POA: Diagnosis not present

## 2020-11-07 DIAGNOSIS — N1831 Chronic kidney disease, stage 3a: Secondary | ICD-10-CM | POA: Diagnosis not present

## 2020-11-07 DIAGNOSIS — I130 Hypertensive heart and chronic kidney disease with heart failure and stage 1 through stage 4 chronic kidney disease, or unspecified chronic kidney disease: Secondary | ICD-10-CM | POA: Diagnosis not present

## 2020-11-07 NOTE — Patient Outreach (Signed)
[facility]) Care Management  [place]  11/07/2020   [name] 1941-09-07 [id number]  Subjective: Telephone call to patient. She states she is doing fair.  She is now with her son and family.  She states she saw her PCP and cardiologist on yesterday for follow up.  Discussed recent hospitalizations. Patient was first admitted with pneumonia but then readmitted due to lasix dose being too high.  She reports that her medication is in pill packs but her lasix is self dosed.  She reports that her granddaughter handles her medication for her now and helps with her bathing and dressing. Patient utilizing walker for ambulation and is more dependent on her oxygen at 4 liters.  Patient has home health nurse and PT.  She states someone is due for a visit today. Patient goal is to gain back some of her independence in order to totally care for herself.     Objective:   Encounter Medications:  Outpatient Encounter Medications as of 11/07/2020  Medication Sig  . allopurinol (ZYLOPRIM) 100 MG tablet Take 100 mg by mouth daily.  . ARIPiprazole (ABILIFY) 2 MG tablet Take 2 mg by mouth daily.  Marland Kitchen ascorbic acid (VITAMIN C) 500 MG tablet Take 500 mg by mouth daily.  Marland Kitchen aspirin 81 MG chewable tablet Chew 1 tablet (81 mg total) by mouth daily.  Marland Kitchen atorvastatin (LIPITOR) 20 MG tablet Take 20 mg by mouth daily.  . citalopram (CELEXA) 40 MG tablet Take 40 mg by mouth at bedtime.  . cyclobenzaprine (FLEXERIL) 10 MG tablet Take 10 mg by mouth 3 (three) times daily as needed for muscle spasms.   . furosemide (LASIX) 40 MG tablet Take 1 tablet (40 mg total) by mouth 2 (two) times daily. Take two tablets by mouth every Monday and Thursday afternoon.  Take one tablet all other afternoons.  Marland Kitchen gabapentin (NEURONTIN) 100 MG capsule Take 100 mg by mouth daily.  Marland Kitchen guaiFENesin (MUCINEX) 600 MG 12 hr tablet Take 600 mg by mouth 2 (two) times daily.   . INCRUSE ELLIPTA 62.5 MCG/INH AEPB Inhale  1 puff into the lungs 2 (two) times daily.   . Ipratropium-Albuterol (COMBIVENT) 20-100 MCG/ACT AERS respimat Inhale 1 puff into the lungs every 6 (six) hours as needed for wheezing or shortness of breath (or to help expel mucous).   Marland Kitchen levothyroxine (SYNTHROID) 50 MCG tablet Take 1 tablet (50 mcg total) by mouth daily at 6 (six) AM.  . magnesium oxide (MAG-OX) 400 (241.3 Mg) MG tablet Take 1 tablet (400 mg total) by mouth 2 (two) times daily.  . Melatonin 10 MG TABS Take 10 mg by mouth at bedtime.   Marland Kitchen neomycin-polymyxin-hydrocortisone (CORTISPORIN) 3.5-10000-1 OTIC suspension Place 5 drops into the right ear daily as needed (ear pain).   . nitroGLYCERIN (NITROSTAT) 0.4 MG SL tablet Place 1 tablet (0.4 mg total) under the tongue every 5 (five) minutes as needed for chest pain. For chest pain  . ofloxacin (FLOXIN) 0.3 % OTIC solution Place 2 drops into the left ear daily.   Marland Kitchen omeprazole (PRILOSEC) 40 MG capsule Take 1 capsule (40 mg total) by mouth 2 (two) times daily.  . OXYGEN Inhale 4 L/min into the lungs as needed (DURING ALL TIMES OF EXERTION).   Marland Kitchen pilocarpine (PILOCAR) 1 % ophthalmic solution Place 1 drop into both eyes 2 (two) times daily.  . polyethylene glycol (MIRALAX / GLYCOLAX) 17 g packet Take 17 g by mouth 2 (two) times daily.  Marland Kitchen  potassium chloride SA (KLOR-CON) 20 MEQ tablet Take 20 mEq by mouth daily.  . pramipexole (MIRAPEX) 0.25 MG tablet Take 0.25 mg by mouth every evening.   . roflumilast (DALIRESP) 500 MCG TABS tablet Take 500 mcg by mouth every evening.   Marland Kitchen SPIRIVA RESPIMAT 1.25 MCG/ACT AERS Inhale 2 puffs into the lungs daily.   Marland Kitchen spironolactone (ALDACTONE) 25 MG tablet Take 25 mg by mouth daily.  . traZODone (DESYREL) 50 MG tablet Take 150 mg by mouth at bedtime.   Marland Kitchen XTAMPZA ER 9 MG C12A Take 9 mg by mouth every 8 (eight) hours.  . ferrous sulfate (FERROUSUL) 325 (65 FE) MG tablet Take 1 tablet (325 mg total) by mouth 3 (three) times daily with meals for 14 days.   No  facility-administered encounter medications on file as of 11/07/2020.    Functional Status:  In your present state of health, do you have any difficulty performing the following activities: 11/07/2020 10/28/2020  Hearing? N N  Vision? N N  Difficulty concentrating or making decisions? Y Y  Comment Forget while talking sometimes -  Walking or climbing stairs? Y Y  Comment Weakness family assists -  Dressing or bathing? Y N  Comment granddaughter helps -  Doing errands, shopping? [name]  Comment family helps/takes -  Preparing Food and eating ? Y -  Comment family provides -  Using the Toilet? N -  In the past six months, have you accidently leaked urine? N -  Do you have problems with loss of bowel control? N -  Managing your Medications? Y -  Comment granddaughter does medications -  Managing your Finances? Y -  Comment family helps -  Housekeeping or managing your Housekeeping? Y -  Comment family does -  Some recent data might be hidden    Fall/Depression Screening: Fall Risk  11/07/2020 08/15/2020 06/14/2020  Falls in the past year? 0 0 1  Comment - - -  Number falls in past yr: - - 1  Injury with Fall? - - 1  Risk for fall due to : - - Impaired balance/gait  Follow up - - -   PHQ 2/9 Scores 11/07/2020 08/15/2020 03/30/2020 10/11/2019 10/12/2018 05/13/2018 11/04/2017  PHQ - 2 Score 0 1 0 2 0 6 3  PHQ- 9 Score - - - 17 - 20 6    Assessment: Patient with recent hospitalizations for pneumonia and acute kidney injury. Patient recovering with the assistance of family.   Goals Addressed            This Visit's Progress   . THN-Make and Keep All Appointments       Follow Up Date 12/20/20   - ask family or friend for a ride - keep a calendar with appointment dates    Why is this important?   Part of staying healthy is seeing the doctor for follow-up care.  If you forget your appointments, there are some things you can do to stay on track.    Notes:     . THN-Track and  Manage Fluids and Swelling       Follow Up Date 12/20/20   - call office if I gain more than 2 pounds in one day or 5 pounds in one week - do ankle pumps when sitting - keep legs up while sitting - track weight in diary - use salt in moderation - watch for swelling in feet, ankles and legs every day - weigh myself daily  Why is this important?   It is important to check your weight daily and watch how much salt and liquids you have.  It will help you to manage your heart failure.    Notes:        Plan: RN CM will send update to physician, along with new barriers letter. RN CM will send new welcome letter to patient. RN CM will contact patient again this month and patient agreeable.   [name], RN, MSN [place] Management Care Management Coordinator Direct Line [phone] Cell [phone] Toll Free: [phone]  Fax: [phone]

## 2020-11-08 ENCOUNTER — Ambulatory Visit: Payer: Medicare HMO | Admitting: Cardiology

## 2020-11-08 DIAGNOSIS — N179 Acute kidney failure, unspecified: Secondary | ICD-10-CM | POA: Diagnosis not present

## 2020-11-09 DIAGNOSIS — I088 Other rheumatic multiple valve diseases: Secondary | ICD-10-CM | POA: Diagnosis not present

## 2020-11-09 DIAGNOSIS — I0981 Rheumatic heart failure: Secondary | ICD-10-CM | POA: Diagnosis not present

## 2020-11-09 DIAGNOSIS — I25118 Atherosclerotic heart disease of native coronary artery with other forms of angina pectoris: Secondary | ICD-10-CM | POA: Diagnosis not present

## 2020-11-09 DIAGNOSIS — I5033 Acute on chronic diastolic (congestive) heart failure: Secondary | ICD-10-CM | POA: Diagnosis not present

## 2020-11-09 DIAGNOSIS — J439 Emphysema, unspecified: Secondary | ICD-10-CM | POA: Diagnosis not present

## 2020-11-09 DIAGNOSIS — N1831 Chronic kidney disease, stage 3a: Secondary | ICD-10-CM | POA: Diagnosis not present

## 2020-11-09 DIAGNOSIS — M1030 Gout due to renal impairment, unspecified site: Secondary | ICD-10-CM | POA: Diagnosis not present

## 2020-11-09 DIAGNOSIS — D631 Anemia in chronic kidney disease: Secondary | ICD-10-CM | POA: Diagnosis not present

## 2020-11-09 DIAGNOSIS — I130 Hypertensive heart and chronic kidney disease with heart failure and stage 1 through stage 4 chronic kidney disease, or unspecified chronic kidney disease: Secondary | ICD-10-CM | POA: Diagnosis not present

## 2020-11-12 DIAGNOSIS — I5033 Acute on chronic diastolic (congestive) heart failure: Secondary | ICD-10-CM | POA: Diagnosis not present

## 2020-11-12 DIAGNOSIS — I25118 Atherosclerotic heart disease of native coronary artery with other forms of angina pectoris: Secondary | ICD-10-CM | POA: Diagnosis not present

## 2020-11-12 DIAGNOSIS — I088 Other rheumatic multiple valve diseases: Secondary | ICD-10-CM | POA: Diagnosis not present

## 2020-11-12 DIAGNOSIS — I0981 Rheumatic heart failure: Secondary | ICD-10-CM | POA: Diagnosis not present

## 2020-11-12 DIAGNOSIS — J439 Emphysema, unspecified: Secondary | ICD-10-CM | POA: Diagnosis not present

## 2020-11-12 DIAGNOSIS — M1030 Gout due to renal impairment, unspecified site: Secondary | ICD-10-CM | POA: Diagnosis not present

## 2020-11-12 DIAGNOSIS — D631 Anemia in chronic kidney disease: Secondary | ICD-10-CM | POA: Diagnosis not present

## 2020-11-12 DIAGNOSIS — N1831 Chronic kidney disease, stage 3a: Secondary | ICD-10-CM | POA: Diagnosis not present

## 2020-11-12 DIAGNOSIS — I130 Hypertensive heart and chronic kidney disease with heart failure and stage 1 through stage 4 chronic kidney disease, or unspecified chronic kidney disease: Secondary | ICD-10-CM | POA: Diagnosis not present

## 2020-11-13 DIAGNOSIS — N1832 Chronic kidney disease, stage 3b: Secondary | ICD-10-CM | POA: Diagnosis not present

## 2020-11-13 DIAGNOSIS — I5033 Acute on chronic diastolic (congestive) heart failure: Secondary | ICD-10-CM | POA: Diagnosis not present

## 2020-11-13 DIAGNOSIS — J189 Pneumonia, unspecified organism: Secondary | ICD-10-CM | POA: Diagnosis not present

## 2020-11-13 DIAGNOSIS — R531 Weakness: Secondary | ICD-10-CM | POA: Diagnosis not present

## 2020-11-14 DIAGNOSIS — I5033 Acute on chronic diastolic (congestive) heart failure: Secondary | ICD-10-CM | POA: Diagnosis not present

## 2020-11-14 DIAGNOSIS — I130 Hypertensive heart and chronic kidney disease with heart failure and stage 1 through stage 4 chronic kidney disease, or unspecified chronic kidney disease: Secondary | ICD-10-CM | POA: Diagnosis not present

## 2020-11-14 DIAGNOSIS — M1030 Gout due to renal impairment, unspecified site: Secondary | ICD-10-CM | POA: Diagnosis not present

## 2020-11-14 DIAGNOSIS — I0981 Rheumatic heart failure: Secondary | ICD-10-CM | POA: Diagnosis not present

## 2020-11-14 DIAGNOSIS — D631 Anemia in chronic kidney disease: Secondary | ICD-10-CM | POA: Diagnosis not present

## 2020-11-14 DIAGNOSIS — J439 Emphysema, unspecified: Secondary | ICD-10-CM | POA: Diagnosis not present

## 2020-11-14 DIAGNOSIS — N1831 Chronic kidney disease, stage 3a: Secondary | ICD-10-CM | POA: Diagnosis not present

## 2020-11-14 DIAGNOSIS — I088 Other rheumatic multiple valve diseases: Secondary | ICD-10-CM | POA: Diagnosis not present

## 2020-11-14 DIAGNOSIS — I25118 Atherosclerotic heart disease of native coronary artery with other forms of angina pectoris: Secondary | ICD-10-CM | POA: Diagnosis not present

## 2020-11-15 DIAGNOSIS — J439 Emphysema, unspecified: Secondary | ICD-10-CM | POA: Diagnosis not present

## 2020-11-15 DIAGNOSIS — I088 Other rheumatic multiple valve diseases: Secondary | ICD-10-CM | POA: Diagnosis not present

## 2020-11-15 DIAGNOSIS — N1831 Chronic kidney disease, stage 3a: Secondary | ICD-10-CM | POA: Diagnosis not present

## 2020-11-15 DIAGNOSIS — I0981 Rheumatic heart failure: Secondary | ICD-10-CM | POA: Diagnosis not present

## 2020-11-15 DIAGNOSIS — M1030 Gout due to renal impairment, unspecified site: Secondary | ICD-10-CM | POA: Diagnosis not present

## 2020-11-15 DIAGNOSIS — D631 Anemia in chronic kidney disease: Secondary | ICD-10-CM | POA: Diagnosis not present

## 2020-11-15 DIAGNOSIS — I5033 Acute on chronic diastolic (congestive) heart failure: Secondary | ICD-10-CM | POA: Diagnosis not present

## 2020-11-15 DIAGNOSIS — I25118 Atherosclerotic heart disease of native coronary artery with other forms of angina pectoris: Secondary | ICD-10-CM | POA: Diagnosis not present

## 2020-11-15 DIAGNOSIS — I130 Hypertensive heart and chronic kidney disease with heart failure and stage 1 through stage 4 chronic kidney disease, or unspecified chronic kidney disease: Secondary | ICD-10-CM | POA: Diagnosis not present

## 2020-11-19 DIAGNOSIS — I10 Essential (primary) hypertension: Secondary | ICD-10-CM | POA: Diagnosis not present

## 2020-11-19 DIAGNOSIS — I0981 Rheumatic heart failure: Secondary | ICD-10-CM | POA: Diagnosis not present

## 2020-11-19 DIAGNOSIS — I088 Other rheumatic multiple valve diseases: Secondary | ICD-10-CM | POA: Diagnosis not present

## 2020-11-19 DIAGNOSIS — M1030 Gout due to renal impairment, unspecified site: Secondary | ICD-10-CM | POA: Diagnosis not present

## 2020-11-19 DIAGNOSIS — N1831 Chronic kidney disease, stage 3a: Secondary | ICD-10-CM | POA: Diagnosis not present

## 2020-11-19 DIAGNOSIS — Z23 Encounter for immunization: Secondary | ICD-10-CM | POA: Diagnosis not present

## 2020-11-19 DIAGNOSIS — I5032 Chronic diastolic (congestive) heart failure: Secondary | ICD-10-CM | POA: Diagnosis not present

## 2020-11-19 DIAGNOSIS — I5033 Acute on chronic diastolic (congestive) heart failure: Secondary | ICD-10-CM | POA: Diagnosis not present

## 2020-11-19 DIAGNOSIS — I130 Hypertensive heart and chronic kidney disease with heart failure and stage 1 through stage 4 chronic kidney disease, or unspecified chronic kidney disease: Secondary | ICD-10-CM | POA: Diagnosis not present

## 2020-11-19 DIAGNOSIS — D631 Anemia in chronic kidney disease: Secondary | ICD-10-CM | POA: Diagnosis not present

## 2020-11-19 DIAGNOSIS — J439 Emphysema, unspecified: Secondary | ICD-10-CM | POA: Diagnosis not present

## 2020-11-19 DIAGNOSIS — I25118 Atherosclerotic heart disease of native coronary artery with other forms of angina pectoris: Secondary | ICD-10-CM | POA: Diagnosis not present

## 2020-11-20 ENCOUNTER — Other Ambulatory Visit: Payer: Self-pay

## 2020-11-20 DIAGNOSIS — I5032 Chronic diastolic (congestive) heart failure: Secondary | ICD-10-CM | POA: Diagnosis not present

## 2020-11-20 NOTE — Patient Outreach (Signed)
[facility]) Care Management  [place]  11/20/2020   [name] 03/16/1941 [id number]  Subjective: Telephone call to patient for follow up.  Patient reports she saw her PCP on yesterday.  She states her weight this am is 172 lbs.  She states that she has some fluid on board and that her extra lasix was restarted twice a week.  Discussed weight parameters and when to notify physician. She verbalized understanding and voices no concerns.  Objective:   Encounter Medications:  Outpatient Encounter Medications as of 11/20/2020  Medication Sig  . allopurinol (ZYLOPRIM) 100 MG tablet Take 100 mg by mouth daily.  . ARIPiprazole (ABILIFY) 2 MG tablet Take 2 mg by mouth daily.  Marland Kitchen ascorbic acid (VITAMIN C) 500 MG tablet Take 500 mg by mouth daily.  Marland Kitchen atorvastatin (LIPITOR) 20 MG tablet Take 20 mg by mouth daily.  . citalopram (CELEXA) 40 MG tablet Take 40 mg by mouth at bedtime.  . cyclobenzaprine (FLEXERIL) 10 MG tablet Take 10 mg by mouth 3 (three) times daily as needed for muscle spasms.   . ferrous sulfate (FERROUSUL) 325 (65 FE) MG tablet Take 1 tablet (325 mg total) by mouth 3 (three) times daily with meals for 14 days.  . furosemide (LASIX) 40 MG tablet Take 1 tablet (40 mg total) by mouth 2 (two) times daily. Take two tablets by mouth every Monday and Thursday afternoon.  Take one tablet all other afternoons.  Marland Kitchen gabapentin (NEURONTIN) 100 MG capsule Take 100 mg by mouth daily.  Marland Kitchen guaiFENesin (MUCINEX) 600 MG 12 hr tablet Take 600 mg by mouth 2 (two) times daily.   . INCRUSE ELLIPTA 62.5 MCG/INH AEPB Inhale 1 puff into the lungs 2 (two) times daily.   . Ipratropium-Albuterol (COMBIVENT) 20-100 MCG/ACT AERS respimat Inhale 1 puff into the lungs every 6 (six) hours as needed for wheezing or shortness of breath (or to help expel mucous).   Marland Kitchen levothyroxine (SYNTHROID) 50 MCG tablet Take 1 tablet (50 mcg total) by mouth daily at 6 (six) AM.  . magnesium oxide  (MAG-OX) 400 (241.3 Mg) MG tablet Take 1 tablet (400 mg total) by mouth 2 (two) times daily.  . Melatonin 10 MG TABS Take 10 mg by mouth at bedtime.   Marland Kitchen neomycin-polymyxin-hydrocortisone (CORTISPORIN) 3.5-10000-1 OTIC suspension Place 5 drops into the right ear daily as needed (ear pain).   . nitroGLYCERIN (NITROSTAT) 0.4 MG SL tablet Place 1 tablet (0.4 mg total) under the tongue every 5 (five) minutes as needed for chest pain. For chest pain  . ofloxacin (FLOXIN) 0.3 % OTIC solution Place 2 drops into the left ear daily.   Marland Kitchen omeprazole (PRILOSEC) 40 MG capsule Take 1 capsule (40 mg total) by mouth 2 (two) times daily.  . OXYGEN Inhale 4 L/min into the lungs as needed (DURING ALL TIMES OF EXERTION).   Marland Kitchen pilocarpine (PILOCAR) 1 % ophthalmic solution Place 1 drop into both eyes 2 (two) times daily.  . polyethylene glycol (MIRALAX / GLYCOLAX) 17 g packet Take 17 g by mouth 2 (two) times daily.  . potassium chloride SA (KLOR-CON) 20 MEQ tablet Take 20 mEq by mouth daily.  . pramipexole (MIRAPEX) 0.25 MG tablet Take 0.25 mg by mouth every evening.   . roflumilast (DALIRESP) 500 MCG TABS tablet Take 500 mcg by mouth every evening.   Marland Kitchen SPIRIVA RESPIMAT 1.25 MCG/ACT AERS Inhale 2 puffs into the lungs daily.   Marland Kitchen spironolactone (ALDACTONE) 25 MG tablet Take 25  mg by mouth daily.  . traZODone (DESYREL) 50 MG tablet Take 150 mg by mouth at bedtime.   Marland Kitchen XTAMPZA ER 9 MG C12A Take 9 mg by mouth every 8 (eight) hours.   No facility-administered encounter medications on file as of 11/20/2020.    Functional Status:  In your present state of health, do you have any difficulty performing the following activities: 11/07/2020 10/28/2020  Hearing? N N  Vision? N N  Difficulty concentrating or making decisions? Y Y  Comment Forget while talking sometimes -  Walking or climbing stairs? Y Y  Comment Weakness family assists -  Dressing or bathing? Y N  Comment granddaughter helps -  Doing errands, shopping? [name]   Comment family helps/takes -  Preparing Food and eating ? Y -  Comment family provides -  Using the Toilet? N -  In the past six months, have you accidently leaked urine? N -  Do you have problems with loss of bowel control? N -  Managing your Medications? Y -  Comment granddaughter does medications -  Managing your Finances? Y -  Comment family helps -  Housekeeping or managing your Housekeeping? Y -  Comment family does -  Some recent data might be hidden    Fall/Depression Screening: Fall Risk  11/07/2020 08/15/2020 06/14/2020  Falls in the past year? 0 0 1  Comment - - -  Number falls in past yr: - - 1  Injury with Fall? - - 1  Risk for fall due to : - - Impaired balance/gait  Follow up - - -   PHQ 2/9 Scores 11/07/2020 08/15/2020 03/30/2020 10/11/2019 10/12/2018 05/13/2018 11/04/2017  PHQ - 2 Score 0 1 0 2 0 6 3  PHQ- 9 Score - - - 17 - 20 6    Assessment: Patient recovering from recent hospitalization with support of family.  Goals Addressed            This Visit's Progress   . THN-Make and Keep All Appointments   On track    Follow Up Date 12/20/20   - ask family or friend for a ride - keep a calendar with appointment dates    Why is this important?   Part of staying healthy is seeing the doctor for follow-up care.  If you forget your appointments, there are some things you can do to stay on track.    Notes: Saw PCP 11/19/20    . THN-Track and Manage Fluids and Swelling   On track    Follow Up Date 12/20/20   - call office if I gain more than 2 pounds in one day or 5 pounds in one week - do ankle pumps when sitting - keep legs up while sitting - track weight in diary - use salt in moderation - watch for swelling in feet, ankles and legs every day - weigh myself daily    Why is this important?   It is important to check your weight daily and watch how much salt and liquids you have.  It will help you to manage your heart failure.    Notes: Patient  weighs daily.       Plan: RN CM will contact patient again in the month of December and patient agreeable.   [name], RN, MSN [place] Management Care Management Coordinator Direct Line [phone] Cell [phone] Toll Free: [phone]  Fax: [phone]

## 2020-11-20 NOTE — Patient Instructions (Signed)
Goals Addressed            This Visit's Progress   . THN-Make and Keep All Appointments   On track    Follow Up Date 12/20/20   - ask family or friend for a ride - keep a calendar with appointment dates    Why is this important?   Part of staying healthy is seeing the doctor for follow-up care.  If you forget your appointments, there are some things you can do to stay on track.    Notes: Saw PCP 11/19/20    . THN-Track and Manage Fluids and Swelling   On track    Follow Up Date 12/20/20   - call office if I gain more than 2 pounds in one day or 5 pounds in one week - do ankle pumps when sitting - keep legs up while sitting - track weight in diary - use salt in moderation - watch for swelling in feet, ankles and legs every day - weigh myself daily    Why is this important?   It is important to check your weight daily and watch how much salt and liquids you have.  It will help you to manage your heart failure.    Notes: Patient weighs daily.

## 2020-11-21 DIAGNOSIS — D631 Anemia in chronic kidney disease: Secondary | ICD-10-CM | POA: Diagnosis not present

## 2020-11-21 DIAGNOSIS — J439 Emphysema, unspecified: Secondary | ICD-10-CM | POA: Diagnosis not present

## 2020-11-21 DIAGNOSIS — M1030 Gout due to renal impairment, unspecified site: Secondary | ICD-10-CM | POA: Diagnosis not present

## 2020-11-21 DIAGNOSIS — I0981 Rheumatic heart failure: Secondary | ICD-10-CM | POA: Diagnosis not present

## 2020-11-21 DIAGNOSIS — N1831 Chronic kidney disease, stage 3a: Secondary | ICD-10-CM | POA: Diagnosis not present

## 2020-11-21 DIAGNOSIS — I088 Other rheumatic multiple valve diseases: Secondary | ICD-10-CM | POA: Diagnosis not present

## 2020-11-21 DIAGNOSIS — I5033 Acute on chronic diastolic (congestive) heart failure: Secondary | ICD-10-CM | POA: Diagnosis not present

## 2020-11-21 DIAGNOSIS — I25118 Atherosclerotic heart disease of native coronary artery with other forms of angina pectoris: Secondary | ICD-10-CM | POA: Diagnosis not present

## 2020-11-21 DIAGNOSIS — I130 Hypertensive heart and chronic kidney disease with heart failure and stage 1 through stage 4 chronic kidney disease, or unspecified chronic kidney disease: Secondary | ICD-10-CM | POA: Diagnosis not present

## 2020-11-22 ENCOUNTER — Other Ambulatory Visit: Payer: Self-pay

## 2020-11-22 DIAGNOSIS — I0981 Rheumatic heart failure: Secondary | ICD-10-CM | POA: Diagnosis not present

## 2020-11-22 DIAGNOSIS — D631 Anemia in chronic kidney disease: Secondary | ICD-10-CM | POA: Diagnosis not present

## 2020-11-22 DIAGNOSIS — I088 Other rheumatic multiple valve diseases: Secondary | ICD-10-CM | POA: Diagnosis not present

## 2020-11-22 DIAGNOSIS — M1030 Gout due to renal impairment, unspecified site: Secondary | ICD-10-CM | POA: Diagnosis not present

## 2020-11-22 DIAGNOSIS — I5033 Acute on chronic diastolic (congestive) heart failure: Secondary | ICD-10-CM | POA: Diagnosis not present

## 2020-11-22 DIAGNOSIS — I130 Hypertensive heart and chronic kidney disease with heart failure and stage 1 through stage 4 chronic kidney disease, or unspecified chronic kidney disease: Secondary | ICD-10-CM | POA: Diagnosis not present

## 2020-11-22 DIAGNOSIS — I25118 Atherosclerotic heart disease of native coronary artery with other forms of angina pectoris: Secondary | ICD-10-CM | POA: Diagnosis not present

## 2020-11-22 DIAGNOSIS — J439 Emphysema, unspecified: Secondary | ICD-10-CM | POA: Diagnosis not present

## 2020-11-22 DIAGNOSIS — N1831 Chronic kidney disease, stage 3a: Secondary | ICD-10-CM | POA: Diagnosis not present

## 2020-11-22 NOTE — Patient Outreach (Signed)
[facility]) Care Management  11/22/2020  Shanice Poznanski May 31, 1941 [id number]   Date of Review: 11/22/20 Reason:  Readmission  PCP: Dr. Vassie Loll Eyk Insurance: [employer]  Medical Info: Alnita Aybar is a 79 y.o. 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 Patient normally lives alone in her home but now is residing with her son and family.    Admissions: Patient admitted on 10/09/20 with increasing shortness of breath and cough.  COVID testing was negative. Patient was found to have sepsis/ pneumonia, COPD and heart failure.  Patient treated and discharged home with home health on 10/13/20  Patient admitted again on 10/28/20 with low blood pressure. Patient found to be taking too much Lasix.  Patient diagnosed with acute kidney injury, hypokalemia.  Patient discharged home with son and family with home health.  Granddaughter dosing Lasix 40 mg 1 tablet QAM, Lasix 40 mb 1 tablet QPM, Sun, Tues, Wed, Fri, and Sat.  Lasix 40mg -2 tablets Monday and Thursday.   Disposition: Patient at the home of son [name] and his family with [place].  RN CM will continue care management support and education.     [name], RN, MSN [place] Management Care Management Coordinator Direct Line [phone] Cell [phone] Toll Free: [phone]  Fax: [phone]

## 2020-11-24 DIAGNOSIS — J449 Chronic obstructive pulmonary disease, unspecified: Secondary | ICD-10-CM | POA: Diagnosis not present

## 2020-11-28 DIAGNOSIS — I088 Other rheumatic multiple valve diseases: Secondary | ICD-10-CM | POA: Diagnosis not present

## 2020-11-28 DIAGNOSIS — I25118 Atherosclerotic heart disease of native coronary artery with other forms of angina pectoris: Secondary | ICD-10-CM | POA: Diagnosis not present

## 2020-11-28 DIAGNOSIS — D631 Anemia in chronic kidney disease: Secondary | ICD-10-CM | POA: Diagnosis not present

## 2020-11-28 DIAGNOSIS — M1030 Gout due to renal impairment, unspecified site: Secondary | ICD-10-CM | POA: Diagnosis not present

## 2020-11-28 DIAGNOSIS — I5033 Acute on chronic diastolic (congestive) heart failure: Secondary | ICD-10-CM | POA: Diagnosis not present

## 2020-11-28 DIAGNOSIS — J439 Emphysema, unspecified: Secondary | ICD-10-CM | POA: Diagnosis not present

## 2020-11-28 DIAGNOSIS — I130 Hypertensive heart and chronic kidney disease with heart failure and stage 1 through stage 4 chronic kidney disease, or unspecified chronic kidney disease: Secondary | ICD-10-CM | POA: Diagnosis not present

## 2020-11-28 DIAGNOSIS — I0981 Rheumatic heart failure: Secondary | ICD-10-CM | POA: Diagnosis not present

## 2020-11-28 DIAGNOSIS — N1831 Chronic kidney disease, stage 3a: Secondary | ICD-10-CM | POA: Diagnosis not present

## 2020-11-29 DIAGNOSIS — I0981 Rheumatic heart failure: Secondary | ICD-10-CM | POA: Diagnosis not present

## 2020-11-29 DIAGNOSIS — I5033 Acute on chronic diastolic (congestive) heart failure: Secondary | ICD-10-CM | POA: Diagnosis not present

## 2020-11-29 DIAGNOSIS — M1030 Gout due to renal impairment, unspecified site: Secondary | ICD-10-CM | POA: Diagnosis not present

## 2020-11-29 DIAGNOSIS — I088 Other rheumatic multiple valve diseases: Secondary | ICD-10-CM | POA: Diagnosis not present

## 2020-11-29 DIAGNOSIS — I130 Hypertensive heart and chronic kidney disease with heart failure and stage 1 through stage 4 chronic kidney disease, or unspecified chronic kidney disease: Secondary | ICD-10-CM | POA: Diagnosis not present

## 2020-11-29 DIAGNOSIS — J439 Emphysema, unspecified: Secondary | ICD-10-CM | POA: Diagnosis not present

## 2020-11-29 DIAGNOSIS — N1831 Chronic kidney disease, stage 3a: Secondary | ICD-10-CM | POA: Diagnosis not present

## 2020-11-29 DIAGNOSIS — D631 Anemia in chronic kidney disease: Secondary | ICD-10-CM | POA: Diagnosis not present

## 2020-11-29 DIAGNOSIS — I25118 Atherosclerotic heart disease of native coronary artery with other forms of angina pectoris: Secondary | ICD-10-CM | POA: Diagnosis not present

## 2020-12-02 DIAGNOSIS — I5032 Chronic diastolic (congestive) heart failure: Secondary | ICD-10-CM | POA: Diagnosis not present

## 2020-12-02 DIAGNOSIS — N179 Acute kidney failure, unspecified: Secondary | ICD-10-CM | POA: Diagnosis not present

## 2020-12-03 DIAGNOSIS — J439 Emphysema, unspecified: Secondary | ICD-10-CM | POA: Diagnosis not present

## 2020-12-03 DIAGNOSIS — I0981 Rheumatic heart failure: Secondary | ICD-10-CM | POA: Diagnosis not present

## 2020-12-03 DIAGNOSIS — N1831 Chronic kidney disease, stage 3a: Secondary | ICD-10-CM | POA: Diagnosis not present

## 2020-12-03 DIAGNOSIS — I088 Other rheumatic multiple valve diseases: Secondary | ICD-10-CM | POA: Diagnosis not present

## 2020-12-03 DIAGNOSIS — I5033 Acute on chronic diastolic (congestive) heart failure: Secondary | ICD-10-CM | POA: Diagnosis not present

## 2020-12-03 DIAGNOSIS — D631 Anemia in chronic kidney disease: Secondary | ICD-10-CM | POA: Diagnosis not present

## 2020-12-03 DIAGNOSIS — I130 Hypertensive heart and chronic kidney disease with heart failure and stage 1 through stage 4 chronic kidney disease, or unspecified chronic kidney disease: Secondary | ICD-10-CM | POA: Diagnosis not present

## 2020-12-03 DIAGNOSIS — M1030 Gout due to renal impairment, unspecified site: Secondary | ICD-10-CM | POA: Diagnosis not present

## 2020-12-03 DIAGNOSIS — I25118 Atherosclerotic heart disease of native coronary artery with other forms of angina pectoris: Secondary | ICD-10-CM | POA: Diagnosis not present

## 2020-12-05 DIAGNOSIS — I0981 Rheumatic heart failure: Secondary | ICD-10-CM | POA: Diagnosis not present

## 2020-12-05 DIAGNOSIS — I5033 Acute on chronic diastolic (congestive) heart failure: Secondary | ICD-10-CM | POA: Diagnosis not present

## 2020-12-05 DIAGNOSIS — N1831 Chronic kidney disease, stage 3a: Secondary | ICD-10-CM | POA: Diagnosis not present

## 2020-12-05 DIAGNOSIS — I088 Other rheumatic multiple valve diseases: Secondary | ICD-10-CM | POA: Diagnosis not present

## 2020-12-05 DIAGNOSIS — D631 Anemia in chronic kidney disease: Secondary | ICD-10-CM | POA: Diagnosis not present

## 2020-12-05 DIAGNOSIS — I25118 Atherosclerotic heart disease of native coronary artery with other forms of angina pectoris: Secondary | ICD-10-CM | POA: Diagnosis not present

## 2020-12-05 DIAGNOSIS — M1030 Gout due to renal impairment, unspecified site: Secondary | ICD-10-CM | POA: Diagnosis not present

## 2020-12-05 DIAGNOSIS — J439 Emphysema, unspecified: Secondary | ICD-10-CM | POA: Diagnosis not present

## 2020-12-05 DIAGNOSIS — I130 Hypertensive heart and chronic kidney disease with heart failure and stage 1 through stage 4 chronic kidney disease, or unspecified chronic kidney disease: Secondary | ICD-10-CM | POA: Diagnosis not present

## 2020-12-07 ENCOUNTER — Other Ambulatory Visit: Payer: Self-pay

## 2020-12-07 ENCOUNTER — Ambulatory Visit: Payer: Medicare HMO

## 2020-12-07 NOTE — Patient Instructions (Signed)
Goals Addressed            This Visit's Progress    THN-Make and Keep All Appointments   On track    Follow Up Date 12/21/20   - ask family or friend for a ride - keep a calendar with appointment dates    Why is this important?   Part of staying healthy is seeing the doctor for follow-up care.  If you forget your appointments, there are some things you can do to stay on track.    Notes:      THN-Track and Manage Fluids and Swelling   On track    Follow Up Date 12/21/20   - call office if I gain more than 2 pounds in one day or 5 pounds in one week - do ankle pumps when sitting - keep legs up while sitting - track weight in diary - use salt in moderation - watch for swelling in feet, ankles and legs every day - weigh myself daily    Why is this important?   It is important to check your weight daily and watch how much salt and liquids you have.  It will help you to manage your heart failure.    Notes: Patient weighs daily. 12/07/20 weight 174 lbs

## 2020-12-07 NOTE — Patient Outreach (Signed)
[name]) Care Management  [name]  12/07/2020   [name] [birth year] [id number]  Subjective: Telephone call to patient for follow up. She reports she is doing better.  She reports she continues to get fluid off.  She reports that her weight today was 174 lbs.  Discussed heart failure and management and importance of reporting changes.  She verbalized understanding and voices no concerns.    Objective:   Encounter Medications:  Outpatient Encounter Medications as of 12/07/2020  Medication Sig  . allopurinol (ZYLOPRIM) 100 MG tablet Take 100 mg by mouth daily.  . ARIPiprazole (ABILIFY) 2 MG tablet Take 2 mg by mouth daily.  Marland Kitchen ascorbic acid (VITAMIN C) 500 MG tablet Take 500 mg by mouth daily.  Marland Kitchen atorvastatin (LIPITOR) 20 MG tablet Take 20 mg by mouth daily.  . citalopram (CELEXA) 40 MG tablet Take 40 mg by mouth at bedtime.  . cyclobenzaprine (FLEXERIL) 10 MG tablet Take 10 mg by mouth 3 (three) times daily as needed for muscle spasms.   . ferrous sulfate (FERROUSUL) 325 (65 FE) MG tablet Take 1 tablet (325 mg total) by mouth 3 (three) times daily with meals for 14 days.  . furosemide (LASIX) 40 MG tablet Take 1 tablet (40 mg total) by mouth 2 (two) times daily. Take two tablets by mouth every Monday and Thursday afternoon.  Take one tablet all other afternoons.  Marland Kitchen gabapentin (NEURONTIN) 100 MG capsule Take 100 mg by mouth daily.  Marland Kitchen guaiFENesin (MUCINEX) 600 MG 12 hr tablet Take 600 mg by mouth 2 (two) times daily.   . INCRUSE ELLIPTA 62.5 MCG/INH AEPB Inhale 1 puff into the lungs 2 (two) times daily.   . Ipratropium-Albuterol (COMBIVENT) 20-100 MCG/ACT AERS respimat Inhale 1 puff into the lungs every 6 (six) hours as needed for wheezing or shortness of breath (or to help expel mucous).   Marland Kitchen levothyroxine (SYNTHROID) 50 MCG tablet Take 1 tablet (50 mcg total) by mouth daily at 6 (six) AM.  . magnesium oxide (MAG-OX) 400 (241.3 Mg) MG tablet Take 1  tablet (400 mg total) by mouth 2 (two) times daily.  . Melatonin 10 MG TABS Take 10 mg by mouth at bedtime.   Marland Kitchen neomycin-polymyxin-hydrocortisone (CORTISPORIN) 3.5-10000-1 OTIC suspension Place 5 drops into the right ear daily as needed (ear pain).   . nitroGLYCERIN (NITROSTAT) 0.4 MG SL tablet Place 1 tablet (0.4 mg total) under the tongue every 5 (five) minutes as needed for chest pain. For chest pain  . ofloxacin (FLOXIN) 0.3 % OTIC solution Place 2 drops into the left ear daily.   Marland Kitchen omeprazole (PRILOSEC) 40 MG capsule Take 1 capsule (40 mg total) by mouth 2 (two) times daily.  . OXYGEN Inhale 4 L/min into the lungs as needed (DURING ALL TIMES OF EXERTION).   Marland Kitchen pilocarpine (PILOCAR) 1 % ophthalmic solution Place 1 drop into both eyes 2 (two) times daily.  . polyethylene glycol (MIRALAX / GLYCOLAX) 17 g packet Take 17 g by mouth 2 (two) times daily.  . potassium chloride SA (KLOR-CON) 20 MEQ tablet Take 20 mEq by mouth daily.  . pramipexole (MIRAPEX) 0.25 MG tablet Take 0.25 mg by mouth every evening.   . roflumilast (DALIRESP) 500 MCG TABS tablet Take 500 mcg by mouth every evening.   Marland Kitchen SPIRIVA RESPIMAT 1.25 MCG/ACT AERS Inhale 2 puffs into the lungs daily.   Marland Kitchen spironolactone (ALDACTONE) 25 MG tablet Take 25 mg by mouth daily.  . traZODone (DESYREL) 50  MG tablet Take 150 mg by mouth at bedtime.   Marland Kitchen XTAMPZA ER 9 MG C12A Take 9 mg by mouth every 8 (eight) hours.   No facility-administered encounter medications on file as of 12/07/2020.    Functional Status:  In your present state of health, do you have any difficulty performing the following activities: 11/07/2020 10/28/2020  Hearing? N N  Vision? N N  Difficulty concentrating or making decisions? Y Y  Comment Forget while talking sometimes -  Walking or climbing stairs? Y Y  Comment Weakness family assists -  Dressing or bathing? Y N  Comment granddaughter helps -  Doing errands, shopping? [name]  Comment family helps/takes -  Preparing  Food and eating ? Y -  Comment family provides -  Using the Toilet? N -  In the past six months, have you accidently leaked urine? N -  Do you have problems with loss of bowel control? N -  Managing your Medications? Y -  Comment granddaughter does medications -  Managing your Finances? Y -  Comment family helps -  Housekeeping or managing your Housekeeping? Y -  Comment family does -  Some recent data might be hidden    Fall/Depression Screening: Fall Risk  11/07/2020 08/15/2020 06/14/2020  Falls in the past year? 0 0 1  Comment - - -  Number falls in past yr: - - 1  Injury with Fall? - - 1  Risk for fall due to : - - Impaired balance/gait  Follow up - - -   PHQ 2/9 Scores 11/07/2020 08/15/2020 03/30/2020 10/11/2019 10/12/2018 05/13/2018 11/04/2017  PHQ - 2 Score 0 1 0 2 0 6 3  PHQ- 9 Score - - - 17 - 20 6    Assessment: Patient continues to manage chronic conditions with assistance of her family. Goals Addressed            This Visit's Progress   . THN-Make and Keep All Appointments   On track    Follow Up Date 12/21/20   - ask family or friend for a ride - keep a calendar with appointment dates    Why is this important?   Part of staying healthy is seeing the doctor for follow-up care.  If you forget your appointments, there are some things you can do to stay on track.    Notes:     . THN-Track and Manage Fluids and Swelling   On track    Follow Up Date 12/21/20   - call office if I gain more than 2 pounds in one day or 5 pounds in one week - do ankle pumps when sitting - keep legs up while sitting - track weight in diary - use salt in moderation - watch for swelling in feet, ankles and legs every day - weigh myself daily    Why is this important?   It is important to check your weight daily and watch how much salt and liquids you have.  It will help you to manage your heart failure.    Notes: Patient weighs daily. 12/07/20 weight 174 lbs       Plan: RN  CM will follow up with patient again this month and patient agreeable.   Follow-up:  Patient agrees to Care Plan and Follow-up.   [name], RN, MSN [place] Management Care Management Coordinator Direct Line [phone] Cell [phone] Toll Free: [phone]  Fax: [phone]

## 2020-12-12 DIAGNOSIS — J439 Emphysema, unspecified: Secondary | ICD-10-CM | POA: Diagnosis not present

## 2020-12-12 DIAGNOSIS — I5033 Acute on chronic diastolic (congestive) heart failure: Secondary | ICD-10-CM | POA: Diagnosis not present

## 2020-12-12 DIAGNOSIS — N1831 Chronic kidney disease, stage 3a: Secondary | ICD-10-CM | POA: Diagnosis not present

## 2020-12-12 DIAGNOSIS — I25118 Atherosclerotic heart disease of native coronary artery with other forms of angina pectoris: Secondary | ICD-10-CM | POA: Diagnosis not present

## 2020-12-12 DIAGNOSIS — I130 Hypertensive heart and chronic kidney disease with heart failure and stage 1 through stage 4 chronic kidney disease, or unspecified chronic kidney disease: Secondary | ICD-10-CM | POA: Diagnosis not present

## 2020-12-12 DIAGNOSIS — I0981 Rheumatic heart failure: Secondary | ICD-10-CM | POA: Diagnosis not present

## 2020-12-12 DIAGNOSIS — M1030 Gout due to renal impairment, unspecified site: Secondary | ICD-10-CM | POA: Diagnosis not present

## 2020-12-12 DIAGNOSIS — D631 Anemia in chronic kidney disease: Secondary | ICD-10-CM | POA: Diagnosis not present

## 2020-12-12 DIAGNOSIS — I088 Other rheumatic multiple valve diseases: Secondary | ICD-10-CM | POA: Diagnosis not present

## 2020-12-13 ENCOUNTER — Other Ambulatory Visit: Payer: Self-pay | Admitting: Internal Medicine

## 2020-12-19 ENCOUNTER — Other Ambulatory Visit: Payer: Self-pay

## 2020-12-19 NOTE — Patient Instructions (Signed)
Goals Addressed            This Visit's Progress   . THN-Make and Keep All Appointments   On track    Timeframe:  Long-Range Goal Priority:  High Start Date:   12/20/20                          Expected End Date: 01/21/21                     Follow Up Date 01/21/21   - ask family or friend for a ride - keep a calendar with appointment dates    Why is this important?   Part of staying healthy is seeing the doctor for follow-up care.  If you forget your appointments, there are some things you can do to stay on track.    Notes:     . THN-Track and Manage Fluids and Swelling   On track    Timeframe:  Long-Range Goal Priority:  High Start Date:   12/20/20                          Expected End Date: 01/21/21                     Follow Up Date 01/21/21   - call office if I gain more than 2 pounds in one day or 5 pounds in one week - do ankle pumps when sitting - keep legs up while sitting - track weight in diary - use salt in moderation - watch for swelling in feet, ankles and legs every day - weigh myself daily    Why is this important?   It is important to check your weight daily and watch how much salt and liquids you have.  It will help you to manage your heart failure.    Notes: Patient weighs daily. 12/20/20 173 lbs

## 2020-12-19 NOTE — Patient Outreach (Signed)
[facility]) Care Management  [place]  12/19/2020   [name] 06/12/1941 [id number]  Subjective: Telephone call to patient. She reports doing fairly well. Patient back at her home. She has medical alert device. Son and granddaughter come daily to administer medications, help with meals and check on her. Patient utilizing can around the home. Discussed fall prevention. Patient weight 173 lbs.  Reiterated heart failure management.   She verbalized understanding and voices no concerns.     Objective:   Encounter Medications:  Outpatient Encounter Medications as of 12/19/2020  Medication Sig  . allopurinol (ZYLOPRIM) 100 MG tablet Take 100 mg by mouth daily.  . ARIPiprazole (ABILIFY) 2 MG tablet Take 2 mg by mouth daily.  Marland Kitchen ascorbic acid (VITAMIN C) 500 MG tablet Take 500 mg by mouth daily.  Marland Kitchen atorvastatin (LIPITOR) 20 MG tablet Take 20 mg by mouth daily.  . citalopram (CELEXA) 40 MG tablet Take 40 mg by mouth at bedtime.  . cyclobenzaprine (FLEXERIL) 10 MG tablet Take 10 mg by mouth 3 (three) times daily as needed for muscle spasms.   . ferrous sulfate (FERROUSUL) 325 (65 FE) MG tablet Take 1 tablet (325 mg total) by mouth 3 (three) times daily with meals for 14 days.  . furosemide (LASIX) 40 MG tablet Take 1 tablet (40 mg total) by mouth 2 (two) times daily. Take two tablets by mouth every Monday and Thursday afternoon.  Take one tablet all other afternoons.  Marland Kitchen gabapentin (NEURONTIN) 100 MG capsule Take 100 mg by mouth daily.  Marland Kitchen guaiFENesin (MUCINEX) 600 MG 12 hr tablet Take 600 mg by mouth 2 (two) times daily.   . INCRUSE ELLIPTA 62.5 MCG/INH AEPB Inhale 1 puff into the lungs 2 (two) times daily.   . Ipratropium-Albuterol (COMBIVENT) 20-100 MCG/ACT AERS respimat Inhale 1 puff into the lungs every 6 (six) hours as needed for wheezing or shortness of breath (or to help expel mucous).   Marland Kitchen levothyroxine (SYNTHROID) 50 MCG tablet Take 1 tablet (50 mcg  total) by mouth daily at 6 (six) AM.  . magnesium oxide (MAG-OX) 400 (241.3 Mg) MG tablet Take 1 tablet (400 mg total) by mouth 2 (two) times daily.  . Melatonin 10 MG TABS Take 10 mg by mouth at bedtime.   Marland Kitchen neomycin-polymyxin-hydrocortisone (CORTISPORIN) 3.5-10000-1 OTIC suspension Place 5 drops into the right ear daily as needed (ear pain).   . nitroGLYCERIN (NITROSTAT) 0.4 MG SL tablet Place 1 tablet (0.4 mg total) under the tongue every 5 (five) minutes as needed for chest pain. For chest pain  . ofloxacin (FLOXIN) 0.3 % OTIC solution Place 2 drops into the left ear daily.   Marland Kitchen omeprazole (PRILOSEC) 40 MG capsule Take 1 capsule (40 mg total) by mouth 2 (two) times daily.  . OXYGEN Inhale 4 L/min into the lungs as needed (DURING ALL TIMES OF EXERTION).   Marland Kitchen pilocarpine (PILOCAR) 1 % ophthalmic solution Place 1 drop into both eyes 2 (two) times daily.  . polyethylene glycol (MIRALAX / GLYCOLAX) 17 g packet Take 17 g by mouth 2 (two) times daily.  . potassium chloride SA (KLOR-CON) 20 MEQ tablet Take 20 mEq by mouth daily.  . pramipexole (MIRAPEX) 0.25 MG tablet Take 0.25 mg by mouth every evening.   . roflumilast (DALIRESP) 500 MCG TABS tablet Take 500 mcg by mouth every evening.   Marland Kitchen SPIRIVA RESPIMAT 1.25 MCG/ACT AERS Inhale 2 puffs into the lungs daily.   Marland Kitchen spironolactone (ALDACTONE) 25 MG tablet  TAKE 1 TABLET BY MOUTH DAILY  . traZODone (DESYREL) 50 MG tablet Take 150 mg by mouth at bedtime.   Marland Kitchen XTAMPZA ER 9 MG C12A Take 9 mg by mouth every 8 (eight) hours.   No facility-administered encounter medications on file as of 12/19/2020.    Functional Status:  In your present state of health, do you have any difficulty performing the following activities: 11/07/2020 10/28/2020  Hearing? N N  Vision? N N  Difficulty concentrating or making decisions? Y Y  Comment Forget while talking sometimes -  Walking or climbing stairs? Y Y  Comment Weakness family assists -  Dressing or bathing? Y N   Comment granddaughter helps -  Doing errands, shopping? Malvin Johns  Comment family helps/takes -  Preparing Food and eating ? Y -  Comment family provides -  Using the Toilet? N -  In the past six months, have you accidently leaked urine? N -  Do you have problems with loss of bowel control? N -  Managing your Medications? Y -  Comment granddaughter does medications -  Managing your Finances? Y -  Comment family helps -  Housekeeping or managing your Housekeeping? Y -  Comment family does -  Some recent data might be hidden    Fall/Depression Screening: Fall Risk  11/07/2020 08/15/2020 06/14/2020  Falls in the past year? 0 0 1  Comment - - -  Number falls in past yr: - - 1  Injury with Fall? - - 1  Risk for fall due to : - - Impaired balance/gait  Follow up - - -   PHQ 2/9 Scores 11/07/2020 08/15/2020 03/30/2020 10/11/2019 10/12/2018 05/13/2018 11/04/2017  PHQ - 2 Score 0 1 0 2 0 6 3  PHQ- 9 Score - - - 17 - 20 6    Assessment:  Patient managing with assistance of her family.   Goals Addressed            This Visit's Progress   . THN-Make and Keep All Appointments   On track    Timeframe:  Long-Range Goal Priority:  High Start Date:   12/20/20                          Expected End Date: 01/21/21                     Follow Up Date 01/21/21   - ask family or friend for a ride - keep a calendar with appointment dates    Why is this important?   Part of staying healthy is seeing the doctor for follow-up care.  If you forget your appointments, there are some things you can do to stay on track.    Notes:     . THN-Track and Manage Fluids and Swelling   On track    Timeframe:  Long-Range Goal Priority:  High Start Date:   12/20/20                          Expected End Date: 01/21/21                     Follow Up Date 01/21/21   - call office if I gain more than 2 pounds in one day or 5 pounds in one week - do ankle pumps when sitting - keep legs up while sitting - track  weight in diary -  use salt in moderation - watch for swelling in feet, ankles and legs every day - weigh myself daily    Why is this important?   It is important to check your weight daily and watch how much salt and liquids you have.  It will help you to manage your heart failure.    Notes: Patient weighs daily. 12/20/20 173 lbs       Plan: RN CM will contact in the month of January. Follow-up:  Patient agrees to Care Plan and Follow-up.   Bary Leriche, RN, MSN [facility] Care Management Care Management Coordinator Direct Line [phone] Cell [phone] Toll Free: [phone]  Fax: [phone]

## 2020-12-20 ENCOUNTER — Ambulatory Visit: Payer: Self-pay

## 2020-12-20 ENCOUNTER — Other Ambulatory Visit: Payer: Self-pay | Admitting: Physician Assistant

## 2020-12-25 DIAGNOSIS — J449 Chronic obstructive pulmonary disease, unspecified: Secondary | ICD-10-CM | POA: Diagnosis not present

## 2020-12-28 DIAGNOSIS — I10 Essential (primary) hypertension: Secondary | ICD-10-CM | POA: Diagnosis not present

## 2020-12-28 DIAGNOSIS — I5033 Acute on chronic diastolic (congestive) heart failure: Secondary | ICD-10-CM | POA: Diagnosis not present

## 2020-12-28 DIAGNOSIS — K7460 Unspecified cirrhosis of liver: Secondary | ICD-10-CM | POA: Diagnosis not present

## 2020-12-28 DIAGNOSIS — N1832 Chronic kidney disease, stage 3b: Secondary | ICD-10-CM | POA: Diagnosis not present

## 2021-01-10 ENCOUNTER — Other Ambulatory Visit: Payer: Self-pay

## 2021-01-10 NOTE — Patient Instructions (Signed)
Goals    . THN-Make and Keep All Appointments     Timeframe:  Long-Range Goal Priority:  High Start Date:   12/20/20                          Expected End Date: 02/18/21                  Follow Up Date 02/18/21   - ask family or friend for a ride - keep a calendar with appointment dates    Why is this important?   Part of staying healthy is seeing the doctor for follow-up care.  If you forget your appointments, there are some things you can do to stay on track.    Notes: Family takes to appointments    . THN-Track and Manage Fluids and Swelling     Timeframe:  Long-Range Goal Priority:  High Start Date:   12/20/20                          Expected End Date: 02/18/21                 Follow Up Date 02/18/21   - call office if I gain more than 2 pounds in one day or 5 pounds in one week - do ankle pumps when sitting - keep legs up while sitting - track weight in diary - use salt in moderation - watch for swelling in feet, ankles and legs every day - weigh myself daily    Why is this important?   It is important to check your weight daily and watch how much salt and liquids you have.  It will help you to manage your heart failure.    Notes: Patient weighs daily. 12/20/20 173 lbs 01/10/21- 171 lbs

## 2021-01-10 NOTE — Patient Outreach (Signed)
[place] [facility]) Care Management  [place]  01/10/2021   [name] [birth year] [id number]  Subjective: Telephone call to patient for follow up.  Patient reports doing good.  She reports that her weight is down to 171 lbs.  She continues to weigh daily and limit salt intake.  Discussed continued heart failure maintenance. She verbalized understanding and voices no concerns.   Objective:   Encounter Medications:  Outpatient Encounter Medications as of 01/10/2021  Medication Sig  . allopurinol (ZYLOPRIM) 100 MG tablet Take 100 mg by mouth daily.  . ARIPiprazole (ABILIFY) 2 MG tablet Take 2 mg by mouth daily.  Marland Kitchen ascorbic acid (VITAMIN C) 500 MG tablet Take 500 mg by mouth daily.  Marland Kitchen atorvastatin (LIPITOR) 20 MG tablet Take 20 mg by mouth daily.  . citalopram (CELEXA) 40 MG tablet Take 40 mg by mouth at bedtime.  . cyclobenzaprine (FLEXERIL) 10 MG tablet Take 10 mg by mouth 3 (three) times daily as needed for muscle spasms.   . furosemide (LASIX) 40 MG tablet Take 1 tablet (40 mg total) by mouth 2 (two) times daily. Take two tablets by mouth every Monday and Thursday afternoon.  Take one tablet all other afternoons.  Marland Kitchen gabapentin (NEURONTIN) 100 MG capsule Take 100 mg by mouth daily.  Marland Kitchen guaiFENesin (MUCINEX) 600 MG 12 hr tablet Take 600 mg by mouth 2 (two) times daily.  . INCRUSE ELLIPTA 62.5 MCG/INH AEPB Inhale 1 puff into the lungs 2 (two) times daily.   . Ipratropium-Albuterol (COMBIVENT) 20-100 MCG/ACT AERS respimat Inhale 1 puff into the lungs every 6 (six) hours as needed for wheezing or shortness of breath (or to help expel mucous).   Marland Kitchen levothyroxine (SYNTHROID) 50 MCG tablet Take 1 tablet (50 mcg total) by mouth daily at 6 (six) AM.  . magnesium oxide (MAG-OX) 400 (241.3 Mg) MG tablet Take 1 tablet (400 mg total) by mouth 2 (two) times daily.  . Melatonin 10 MG TABS Take 10 mg by mouth at bedtime.   Marland Kitchen neomycin-polymyxin-hydrocortisone (CORTISPORIN)  3.5-10000-1 OTIC suspension Place 5 drops into the right ear daily as needed (ear pain).   . nitroGLYCERIN (NITROSTAT) 0.4 MG SL tablet Place 1 tablet (0.4 mg total) under the tongue every 5 (five) minutes as needed for chest pain. For chest pain  . ofloxacin (FLOXIN) 0.3 % OTIC solution Place 2 drops into the left ear daily.   Marland Kitchen omeprazole (PRILOSEC) 40 MG capsule TAKE 1 CAPSULE BY MOUTH 2 TIMES DAILY.  Marland Kitchen OXYGEN Inhale 4 L/min into the lungs as needed (DURING ALL TIMES OF EXERTION).   Marland Kitchen pilocarpine (PILOCAR) 1 % ophthalmic solution Place 1 drop into both eyes 2 (two) times daily.  . polyethylene glycol (MIRALAX / GLYCOLAX) 17 g packet Take 17 g by mouth 2 (two) times daily.  . potassium chloride SA (KLOR-CON) 20 MEQ tablet Take 20 mEq by mouth daily.  . pramipexole (MIRAPEX) 0.25 MG tablet Take 0.25 mg by mouth every evening.   . roflumilast (DALIRESP) 500 MCG TABS tablet Take 500 mcg by mouth every evening.   Marland Kitchen SPIRIVA RESPIMAT 1.25 MCG/ACT AERS Inhale 2 puffs into the lungs daily.   Marland Kitchen spironolactone (ALDACTONE) 25 MG tablet TAKE 1 TABLET BY MOUTH DAILY  . traZODone (DESYREL) 50 MG tablet Take 150 mg by mouth at bedtime.   Marland Kitchen XTAMPZA ER 9 MG C12A Take 9 mg by mouth every 8 (eight) hours.  . ferrous sulfate (FERROUSUL) 325 (65 FE) MG tablet Take 1  tablet (325 mg total) by mouth 3 (three) times daily with meals for 14 days.   No facility-administered encounter medications on file as of 01/10/2021.    Functional Status:  In your present state of health, do you have any difficulty performing the following activities: 01/10/2021 11/07/2020  Hearing? N N  Vision? N N  Difficulty concentrating or making decisions? [name]  Comment forgets things at times Forget while talking sometimes  Walking or climbing stairs? Y Y  Comment weakness at times- family assists Weakness family assists  Dressing or bathing? Y Y  Comment granddaughter helps granddaughter helps  Doing errands, shopping? Y Y  Comment  family helps family helps/takes  Preparing Food and eating ? [name]  Comment family helps family provides  Using the Toilet? N N  In the past six months, have you accidently leaked urine? N N  Do you have problems with loss of bowel control? N N  Managing your Medications? Y Y  Comment granddaughter helps granddaughter does medications  Managing your Finances? [name]  Comment family helps family helps  Housekeeping or managing your Housekeeping? [name]  Comment family does family does  Some recent data might be hidden    Fall/Depression Screening: Fall Risk  01/10/2021 11/07/2020 08/15/2020  Falls in the past year? 0 0 0  Comment - - -  Number falls in past yr: - - -  Injury with Fall? - - -  Risk for fall due to : - - -  Follow up - - -   PHQ 2/9 Scores 01/10/2021 11/07/2020 08/15/2020 03/30/2020 10/11/2019 10/12/2018 05/13/2018  PHQ - 2 Score 0 0 1 0 2 0 6  PHQ- 9 Score - - - - 17 - 20    Assessment: Patient continues to manage chronic conditions.   Goals Addressed            This Visit's Progress   . THN-Make and Keep All Appointments   On track    Timeframe:  Long-Range Goal Priority:  High Start Date:   12/20/20                          Expected End Date: 02/18/21                  Follow Up Date 02/18/21   - ask family or friend for a ride - keep a calendar with appointment dates    Why is this important?   Part of staying healthy is seeing the doctor for follow-up care.  If you forget your appointments, there are some things you can do to stay on track.    Notes: Family takes to appointments    . THN-Track and Manage Fluids and Swelling   On track    Timeframe:  Long-Range Goal Priority:  High Start Date:   12/20/20                          Expected End Date: 02/18/21                 Follow Up Date 02/18/21   - call office if I gain more than 2 pounds in one day or 5 pounds in one week - do ankle pumps when sitting - keep legs up while sitting - track weight in  diary - use salt in moderation - watch for swelling in feet, ankles and legs every day -  weigh myself daily    Why is this important?   It is important to check your weight daily and watch how much salt and liquids you have.  It will help you to manage your heart failure.    Notes: Patient weighs daily. 12/20/20 173 lbs 01/10/21- 171 lbs       Plan: RN CM will contact next month. Follow-up:  Patient agrees to Care Plan and Follow-up.   [name], RN, MSN [place] Management Care Management Coordinator Direct Line [phone] Cell [phone] Toll Free: [phone]  Fax: [phone]

## 2021-01-25 ENCOUNTER — Other Ambulatory Visit: Payer: Self-pay

## 2021-01-25 DIAGNOSIS — J449 Chronic obstructive pulmonary disease, unspecified: Secondary | ICD-10-CM | POA: Diagnosis not present

## 2021-01-25 NOTE — Patient Outreach (Signed)
[place] [facility]) Care Management  01/25/2021  [name] 1941-10-25 [id number]   Telephone call to patient for disease management follow up.   No answer.  HIPAA compliant voice message left.    Plan: If no return call, RN CM will attempt patient again in the month of March.  [name], RN, MSN [place] Management Care Management Coordinator Direct Line [phone] Cell [phone] Toll Free: [phone]  Fax: [phone]

## 2021-02-03 NOTE — Progress Notes (Signed)
   Cardiology Office Note   Date:  02/04/2021   ID:  [name], [place] September 14, 1941, MRN [id number]  PCP:  [name], MD  Cardiologist:   Dorris Carnes, MD    Pt admitted to [facility] for SOB , edema  See my note in note section of chart  Signed, Dorris Carnes, MD  02/04/2021 12:03 PM    [address], [place],   [address] Phone: [phone]; Fax: [phone]

## 2021-02-04 ENCOUNTER — Encounter: Payer: Self-pay | Admitting: Internal Medicine

## 2021-02-04 ENCOUNTER — Ambulatory Visit (INDEPENDENT_AMBULATORY_CARE_PROVIDER_SITE_OTHER): Payer: Medicare HMO | Admitting: Internal Medicine

## 2021-02-04 ENCOUNTER — Encounter (HOSPITAL_COMMUNITY): Payer: Self-pay | Admitting: Internal Medicine

## 2021-02-04 ENCOUNTER — Other Ambulatory Visit: Payer: Self-pay

## 2021-02-04 ENCOUNTER — Inpatient Hospital Stay (HOSPITAL_COMMUNITY)
Admission: AD | Admit: 2021-02-04 | Discharge: 2021-02-07 | DRG: 291 | Disposition: A | Discharge status: Home Health | Payer: Medicare HMO | Source: Ambulatory Visit | Attending: Internal Medicine | Admitting: Internal Medicine

## 2021-02-04 VITALS — BP 132/68 | HR 97 | Ht 68.0 in | Wt 168.8 lb

## 2021-02-04 DIAGNOSIS — I5033 Acute on chronic diastolic (congestive) heart failure: Secondary | ICD-10-CM | POA: Diagnosis present

## 2021-02-04 DIAGNOSIS — N183 Chronic kidney disease, stage 3 unspecified: Secondary | ICD-10-CM | POA: Diagnosis present

## 2021-02-04 DIAGNOSIS — K219 Gastro-esophageal reflux disease without esophagitis: Secondary | ICD-10-CM | POA: Diagnosis present

## 2021-02-04 DIAGNOSIS — Z801 Family history of malignant neoplasm of trachea, bronchus and lung: Secondary | ICD-10-CM

## 2021-02-04 DIAGNOSIS — I2510 Atherosclerotic heart disease of native coronary artery without angina pectoris: Secondary | ICD-10-CM

## 2021-02-04 DIAGNOSIS — G4730 Sleep apnea, unspecified: Secondary | ICD-10-CM | POA: Diagnosis present

## 2021-02-04 DIAGNOSIS — Z825 Family history of asthma and other chronic lower respiratory diseases: Secondary | ICD-10-CM

## 2021-02-04 DIAGNOSIS — I25119 Atherosclerotic heart disease of native coronary artery with unspecified angina pectoris: Secondary | ICD-10-CM | POA: Diagnosis not present

## 2021-02-04 DIAGNOSIS — F419 Anxiety disorder, unspecified: Secondary | ICD-10-CM | POA: Diagnosis present

## 2021-02-04 DIAGNOSIS — Z96653 Presence of artificial knee joint, bilateral: Secondary | ICD-10-CM | POA: Diagnosis present

## 2021-02-04 DIAGNOSIS — I5031 Acute diastolic (congestive) heart failure: Secondary | ICD-10-CM | POA: Diagnosis present

## 2021-02-04 DIAGNOSIS — I739 Peripheral vascular disease, unspecified: Secondary | ICD-10-CM | POA: Diagnosis present

## 2021-02-04 DIAGNOSIS — Z8041 Family history of malignant neoplasm of ovary: Secondary | ICD-10-CM

## 2021-02-04 DIAGNOSIS — Z8249 Family history of ischemic heart disease and other diseases of the circulatory system: Secondary | ICD-10-CM

## 2021-02-04 DIAGNOSIS — F32A Depression, unspecified: Secondary | ICD-10-CM | POA: Diagnosis present

## 2021-02-04 DIAGNOSIS — E785 Hyperlipidemia, unspecified: Secondary | ICD-10-CM | POA: Diagnosis present

## 2021-02-04 DIAGNOSIS — R0602 Shortness of breath: Secondary | ICD-10-CM

## 2021-02-04 DIAGNOSIS — Z9981 Dependence on supplemental oxygen: Secondary | ICD-10-CM | POA: Diagnosis not present

## 2021-02-04 DIAGNOSIS — Z981 Arthrodesis status: Secondary | ICD-10-CM

## 2021-02-04 DIAGNOSIS — Z96643 Presence of artificial hip joint, bilateral: Secondary | ICD-10-CM | POA: Diagnosis present

## 2021-02-04 DIAGNOSIS — Z96611 Presence of right artificial shoulder joint: Secondary | ICD-10-CM | POA: Diagnosis present

## 2021-02-04 DIAGNOSIS — Z800 Family history of malignant neoplasm of digestive organs: Secondary | ICD-10-CM

## 2021-02-04 DIAGNOSIS — I25810 Atherosclerosis of coronary artery bypass graft(s) without angina pectoris: Secondary | ICD-10-CM | POA: Diagnosis not present

## 2021-02-04 DIAGNOSIS — Z20822 Contact with and (suspected) exposure to covid-19: Secondary | ICD-10-CM | POA: Diagnosis present

## 2021-02-04 DIAGNOSIS — Z86718 Personal history of other venous thrombosis and embolism: Secondary | ICD-10-CM

## 2021-02-04 DIAGNOSIS — E039 Hypothyroidism, unspecified: Secondary | ICD-10-CM | POA: Diagnosis present

## 2021-02-04 DIAGNOSIS — I480 Paroxysmal atrial fibrillation: Secondary | ICD-10-CM | POA: Diagnosis not present

## 2021-02-04 DIAGNOSIS — I5030 Unspecified diastolic (congestive) heart failure: Secondary | ICD-10-CM

## 2021-02-04 DIAGNOSIS — Z79899 Other long term (current) drug therapy: Secondary | ICD-10-CM | POA: Diagnosis not present

## 2021-02-04 DIAGNOSIS — R7989 Other specified abnormal findings of blood chemistry: Secondary | ICD-10-CM | POA: Diagnosis not present

## 2021-02-04 DIAGNOSIS — I130 Hypertensive heart and chronic kidney disease with heart failure and stage 1 through stage 4 chronic kidney disease, or unspecified chronic kidney disease: Secondary | ICD-10-CM | POA: Diagnosis not present

## 2021-02-04 DIAGNOSIS — R079 Chest pain, unspecified: Secondary | ICD-10-CM | POA: Diagnosis not present

## 2021-02-04 DIAGNOSIS — Z955 Presence of coronary angioplasty implant and graft: Secondary | ICD-10-CM

## 2021-02-04 DIAGNOSIS — Z86711 Personal history of pulmonary embolism: Secondary | ICD-10-CM

## 2021-02-04 DIAGNOSIS — I509 Heart failure, unspecified: Secondary | ICD-10-CM

## 2021-02-04 DIAGNOSIS — J449 Chronic obstructive pulmonary disease, unspecified: Secondary | ICD-10-CM | POA: Diagnosis present

## 2021-02-04 DIAGNOSIS — M109 Gout, unspecified: Secondary | ICD-10-CM | POA: Diagnosis present

## 2021-02-04 LAB — CBC
HCT: 32.7 % — ABNORMAL LOW (ref 36.0–46.0)
Hemoglobin: 10 g/dL — ABNORMAL LOW (ref 12.0–15.0)
MCH: 26.9 pg (ref 26.0–34.0)
MCHC: 30.6 g/dL (ref 30.0–36.0)
MCV: 87.9 fL (ref 80.0–100.0)
Platelets: 188 10*3/uL (ref 150–400)
RBC: 3.72 MIL/uL — ABNORMAL LOW (ref 3.87–5.11)
RDW: 15.5 % (ref 11.5–15.5)
WBC: 8.1 10*3/uL (ref 4.0–10.5)
nRBC: 0 % (ref 0.0–0.2)

## 2021-02-04 LAB — COMPREHENSIVE METABOLIC PANEL
ALT: 12 U/L (ref 0–44)
AST: 17 U/L (ref 15–41)
Albumin: 3.4 g/dL — ABNORMAL LOW (ref 3.5–5.0)
Alkaline Phosphatase: 61 U/L (ref 38–126)
Anion gap: 9 (ref 5–15)
BUN: 26 mg/dL — ABNORMAL HIGH (ref 8–23)
CO2: 31 mmol/L (ref 22–32)
Calcium: 9.2 mg/dL (ref 8.9–10.3)
Chloride: 100 mmol/L (ref 98–111)
Creatinine, Ser: 1.27 mg/dL — ABNORMAL HIGH (ref 0.44–1.00)
GFR, Estimated: 43 mL/min — ABNORMAL LOW (ref >60)
Glucose, Bld: 87 mg/dL (ref 70–99)
Potassium: 3.5 mmol/L (ref 3.5–5.1)
Sodium: 140 mmol/L (ref 135–145)
Total Bilirubin: 0.5 mg/dL (ref 0.3–1.2)
Total Protein: 6.8 g/dL (ref 6.5–8.1)

## 2021-02-04 LAB — BRAIN NATRIURETIC PEPTIDE: B Natriuretic Peptide: 345.4 pg/mL — ABNORMAL HIGH (ref 0.0–100.0)

## 2021-02-04 LAB — CREATININE, SERUM
Creatinine, Ser: 1.31 mg/dL — ABNORMAL HIGH (ref 0.44–1.00)
GFR, Estimated: 41 mL/min — ABNORMAL LOW (ref >60)

## 2021-02-04 LAB — D-DIMER, QUANTITATIVE: D-Dimer, Quant: 1.64 ug/mL-FEU — ABNORMAL HIGH (ref 0.00–0.50)

## 2021-02-04 MED ORDER — SPIRONOLACTONE 25 MG PO TABS
25.0000 mg | ORAL_TABLET | Freq: Every day | ORAL | Status: DC
  Administered 2021-02-05: 25 mg via ORAL
  Filled 2021-02-04: qty 1

## 2021-02-04 MED ORDER — LISINOPRIL 5 MG PO TABS
2.5000 mg | ORAL_TABLET | Freq: Every day | ORAL | Status: DC
  Filled 2021-02-04: qty 1

## 2021-02-04 MED ORDER — MAGNESIUM OXIDE 400 (241.3 MG) MG PO TABS
400.0000 mg | ORAL_TABLET | Freq: Two times a day (BID) | ORAL | Status: DC
  Administered 2021-02-04 – 2021-02-07 (×6): 400 mg via ORAL
  Filled 2021-02-04 (×6): qty 1

## 2021-02-04 MED ORDER — HEPARIN BOLUS VIA INFUSION
2000.0000 [IU] | Freq: Once | INTRAVENOUS | Status: AC
  Administered 2021-02-04: 2000 [IU] via INTRAVENOUS
  Filled 2021-02-04: qty 2000

## 2021-02-04 MED ORDER — ACETAMINOPHEN 325 MG PO TABS
650.0000 mg | ORAL_TABLET | ORAL | Status: DC | PRN
  Administered 2021-02-06: 650 mg via ORAL
  Filled 2021-02-04: qty 2

## 2021-02-04 MED ORDER — HEPARIN SODIUM (PORCINE) 5000 UNIT/ML IJ SOLN
5000.0000 [IU] | Freq: Three times a day (TID) | INTRAMUSCULAR | Status: DC
  Administered 2021-02-04: 5000 [IU] via SUBCUTANEOUS
  Filled 2021-02-04: qty 1

## 2021-02-04 MED ORDER — PILOCARPINE HCL 1 % OP SOLN
1.0000 [drp] | Freq: Two times a day (BID) | OPHTHALMIC | Status: DC
  Administered 2021-02-04 – 2021-02-07 (×6): 1 [drp] via OPHTHALMIC
  Filled 2021-02-04: qty 15

## 2021-02-04 MED ORDER — SODIUM CHLORIDE 0.9% FLUSH: 3.0000 mL | INTRAVENOUS | Status: DC | PRN

## 2021-02-04 MED ORDER — ROFLUMILAST 500 MCG PO TABS
500.0000 ug | ORAL_TABLET | Freq: Every evening | ORAL | Status: DC
  Administered 2021-02-04 – 2021-02-06 (×3): 500 ug via ORAL
  Filled 2021-02-04 (×4): qty 1

## 2021-02-04 MED ORDER — LISINOPRIL 5 MG PO TABS
2.5000 mg | ORAL_TABLET | Freq: Every day | ORAL | Status: DC
  Administered 2021-02-05: 2.5 mg via ORAL

## 2021-02-04 MED ORDER — CITALOPRAM HYDROBROMIDE 20 MG PO TABS
40.0000 mg | ORAL_TABLET | Freq: Every day | ORAL | Status: DC
  Administered 2021-02-04 – 2021-02-06 (×3): 40 mg via ORAL
  Filled 2021-02-04 (×3): qty 2

## 2021-02-04 MED ORDER — HEPARIN (PORCINE) 25000 UT/250ML-% IV SOLN
1000.0000 [IU]/h | INTRAVENOUS | Status: DC
  Administered 2021-02-04: 850 [IU]/h via INTRAVENOUS
  Filled 2021-02-04: qty 250

## 2021-02-04 MED ORDER — ATORVASTATIN CALCIUM 10 MG PO TABS
20.0000 mg | ORAL_TABLET | Freq: Every day | ORAL | Status: DC
  Administered 2021-02-05 – 2021-02-07 (×3): 20 mg via ORAL
  Filled 2021-02-04 (×3): qty 2

## 2021-02-04 MED ORDER — TRAZODONE HCL 50 MG PO TABS
150.0000 mg | ORAL_TABLET | Freq: Every day | ORAL | Status: DC
  Administered 2021-02-04 – 2021-02-06 (×3): 150 mg via ORAL
  Filled 2021-02-04 (×4): qty 1

## 2021-02-04 MED ORDER — SODIUM CHLORIDE 0.9% FLUSH
3.0000 mL | Freq: Two times a day (BID) | INTRAVENOUS | Status: DC
  Administered 2021-02-04 – 2021-02-07 (×6): 3 mL via INTRAVENOUS

## 2021-02-04 MED ORDER — FUROSEMIDE 10 MG/ML IJ SOLN
80.0000 mg | Freq: Once | INTRAMUSCULAR | Status: AC
  Administered 2021-02-04: 80 mg via INTRAVENOUS
  Filled 2021-02-04: qty 8

## 2021-02-04 MED ORDER — SODIUM CHLORIDE 0.9 % IV SOLN: 250.0000 mL | INTRAVENOUS | Status: DC | PRN

## 2021-02-04 MED ORDER — POTASSIUM CHLORIDE CRYS ER 20 MEQ PO TBCR
20.0000 meq | EXTENDED_RELEASE_TABLET | Freq: Once | ORAL | Status: AC
  Administered 2021-02-04: 20 meq via ORAL
  Filled 2021-02-04: qty 1

## 2021-02-04 MED ORDER — GABAPENTIN 100 MG PO CAPS
100.0000 mg | ORAL_CAPSULE | Freq: Every day | ORAL | Status: DC
  Administered 2021-02-05 – 2021-02-07 (×3): 100 mg via ORAL
  Filled 2021-02-04 (×3): qty 1

## 2021-02-04 MED ORDER — PRAMIPEXOLE DIHYDROCHLORIDE 0.25 MG PO TABS
0.2500 mg | ORAL_TABLET | Freq: Every evening | ORAL | Status: DC
  Administered 2021-02-04 – 2021-02-06 (×3): 0.25 mg via ORAL
  Filled 2021-02-04 (×4): qty 1

## 2021-02-04 MED ORDER — NITROGLYCERIN 0.4 MG SL SUBL: 0.4000 mg | SUBLINGUAL_TABLET | SUBLINGUAL | Status: DC | PRN

## 2021-02-04 MED ORDER — ASPIRIN EC 81 MG PO TBEC
81.0000 mg | DELAYED_RELEASE_TABLET | Freq: Every day | ORAL | Status: DC
  Administered 2021-02-05 – 2021-02-07 (×3): 81 mg via ORAL
  Filled 2021-02-04 (×3): qty 1

## 2021-02-04 MED ORDER — ONDANSETRON HCL 4 MG/2ML IJ SOLN: 4.0000 mg | Freq: Four times a day (QID) | INTRAMUSCULAR | Status: DC | PRN

## 2021-02-04 MED ORDER — ALLOPURINOL 100 MG PO TABS
100.0000 mg | ORAL_TABLET | Freq: Every day | ORAL | Status: DC
  Administered 2021-02-05 – 2021-02-07 (×3): 100 mg via ORAL
  Filled 2021-02-04 (×3): qty 1

## 2021-02-04 MED ORDER — LEVALBUTEROL HCL 0.63 MG/3ML IN NEBU
0.6300 mg | INHALATION_SOLUTION | Freq: Three times a day (TID) | RESPIRATORY_TRACT | Status: DC
  Administered 2021-02-04: 0.63 mg via RESPIRATORY_TRACT
  Filled 2021-02-04: qty 3

## 2021-02-04 MED ORDER — ARIPIPRAZOLE 2 MG PO TABS
2.0000 mg | ORAL_TABLET | Freq: Every day | ORAL | Status: DC
  Administered 2021-02-05: 2 mg via ORAL
  Filled 2021-02-04: qty 1

## 2021-02-04 MED ORDER — FERROUS SULFATE 325 (65 FE) MG PO TABS
325.0000 mg | ORAL_TABLET | Freq: Three times a day (TID) | ORAL | Status: DC
  Administered 2021-02-04 – 2021-02-05 (×4): 325 mg via ORAL
  Filled 2021-02-04 (×4): qty 1

## 2021-02-04 MED ORDER — PANTOPRAZOLE SODIUM 40 MG PO TBEC
40.0000 mg | DELAYED_RELEASE_TABLET | Freq: Every day | ORAL | Status: DC
  Administered 2021-02-05 – 2021-02-07 (×3): 40 mg via ORAL
  Filled 2021-02-04 (×3): qty 1

## 2021-02-04 MED ORDER — LEVOTHYROXINE SODIUM 50 MCG PO TABS
50.0000 ug | ORAL_TABLET | Freq: Every day | ORAL | Status: DC
  Administered 2021-02-05 – 2021-02-07 (×3): 50 ug via ORAL
  Filled 2021-02-04 (×3): qty 1

## 2021-02-04 MED ORDER — METHYLPREDNISOLONE SODIUM SUCC 125 MG IJ SOLR
60.0000 mg | Freq: Once | INTRAMUSCULAR | Status: AC
  Administered 2021-02-04: 60 mg via INTRAVENOUS
  Filled 2021-02-04: qty 2

## 2021-02-04 MED ORDER — OXYCODONE HCL ER 10 MG PO T12A
10.0000 mg | EXTENDED_RELEASE_TABLET | Freq: Three times a day (TID) | ORAL | Status: DC
  Administered 2021-02-04 – 2021-02-07 (×9): 10 mg via ORAL
  Filled 2021-02-04 (×9): qty 1

## 2021-02-04 NOTE — H&P (Addendum)
Cardiology Admission History and Physical:   Patient ID: [name] MRN: [id number]; DOB: Apr 17, 1941   Admission date: 02/04/2021  PCP:  [name], [facility]  Cardiologist:  Dorris Carnes, MD  Chief Complaint:  Pt being admitted from clinic for SOB   Patient Profile:   [name] is a 80 y.o. female with CAD, diastolic CHF, CV dz, DVT/PE, GI bleeding, PAF, COPD who is being admitted due to increased SOB   History of Present Illness:   [name] is a 80 y.o.femalewith:  CAD (s/p CABG 2004; DES to LM in 2010; myovue in 2018:  Low risk with distal anterior and apical ischemia), chronic diastolic CHF, CV dz, PAD recurrent DVT/PE, GI bleeding on anticoagulation  COPD, PAF (after CABG), GERD, HL, HTN, renal insufficiency, sleep apnea (not on CPAP)      The pt was admitted to [facility] with sepsis/pneumonia. In October 2021  She came back to the ED in November 2021 with hypotension, weakness   Renal funciton was down  Cr 2.2   Diuretics held initially  She wasthe  diuresed and then eventually sent home on oral lasix 40 bid.    The pt was seen by Tonny Branch a few days after d/c   Was feeling good at that visit   Said her breathing was OK    On review of records, she continued to follow with [facility] after this visti  Was on lasix    At some point it appears that it may have been stopped  She comes in today with daughter   Says her breathing has gotten worse over the past month  Again she says she has not been taking lasix (I cannot see how stopped)   Ankles develped increased swelling SHe notes some wheezing.  Tired a lot   Today she notes some chest tightness.     She denies fevers or chills   Has cough which is nonproductive      Past Medical History:  Diagnosis Date  . Anemia    bld. transfusion post lumbar surgery- 2012  . Anxiety   . Arthralgia    NOS  . Blood transfusion 2012; 02/2018   WITH BACK SURGERY; "related to hematoma in  stomach" (10/06/2018)  . CAD (coronary artery disease)    s/p CABG 2004; s/p DES to LM in 2010;  [place] 10/29/11: EF 50-55%, mild elevated filling pressures, no pulmonary HTN, LM 90% ISR, LAD and CFX occluded, S-RI occluded (old), S-OM3 ok and L-LAD ok, native nondominant RCA 95% -  med rx recommended ; Lexiscan Myoview 7/13 at [facility]: demonstrated "normal LV function, anterior attenuation and localized ischemia, inferior, basilar, mid section"  . Carotid artery disease ([place])    Carotid US 1/47:  RICA 8-29; LICA 56-21; R subclavian stenosis - Repeat 1 year. // Carotid US 05/2019: R 1-39; L 40-59; R vertebral with atypical antegrade flow; R subclavian stenosis >> repeat 1 year // Carotid US 7/21: Bilat 40-59; L subclavian stenosis   . CHF (congestive heart failure) ([place]) 01/21/2020  . Chronic diastolic heart failure (HCC)    Echo 9/10: EF 30-86%, grade 1 diastolic dysfunction  . Chronic lower back pain   . COPD (chronic obstructive pulmonary disease) ([place])    Emphysema dxed by Dr. Woody Seller in [place] based on PFTs per pt in 2006; placed on albuterol  . Depression    TAKES CELEXA  AND  (OFF- WELBUTRIN)  .  Diuretic-induced hypokalemia 10/08/2018  . DVT of lower extremity (deep venous thrombosis) (HCC)    recurrent. bilateral (2 episodes)  . Dyspnea    home o2 when needed  . Dysrhythmia    afib with cabg  . Exertional angina (HCC)    Treated with Isosorbide, Ranexa, amlodipine; intolerant to metoprolol  . GERD (gastroesophageal reflux disease)   . Gout    "on daily RX" (10/06/2018)  . HLD (hyperlipidemia)   . Hypertension   . Hyperthyroidism   . L Subclavian Artery Stenosis    Carotid US 7/21   . Obesity (BMI 30-39.9) 2009   BMI 33  . Osteoarthritis    "all over; hands" (10/06/2018)  . Oxygen deficiency   . Oxygen dependent    4L at home as needed (10/06/2018)  . Pneumonia   . PVD (peripheral vascular disease) ([place])   . Sleep apnea 2012   USED CPAP THEN  STARTED USING SPIRIVA ,  Nov. 2013- last evaluation , changed from mask to aparatus that is just for her nose.. ; reports 05-28-18 "the mask smothers me so i dont use it right now" (10/06/2018)    Past Surgical History:  Procedure Laterality Date  . ANKLE SURGERY  2004  . ANTERIOR CERVICAL DECOMP/DISCECTOMY FUSION  11/2007  . AUGMENTATION MAMMAPLASTY    . [place]   lower; another scheduled, opt. reports 4 back- lumbar, 3 cerv. fusions  for later 2009  . CARDIAC CATHETERIZATION  11/2009   Patent LIMA to LAD and patent SVG to OM1. Occluded SVG to ramus and diagonal. Left main: 90% ostial stenosis, LCX 60-70% proximal stenosis  . CATARACT EXTRACTION W/ INTRAOCULAR LENS  IMPLANT, BILATERAL Bilateral 2006  . COLONOSCOPY WITH PROPOFOL N/A 02/17/2017   Procedure: COLONOSCOPY WITH PROPOFOL;  Surgeon: Jerene Bears, MD;  Location: [facility] ENDOSCOPY;  Service: Endoscopy;  Laterality: N/A;  . CORONARY ANGIOPLASTY WITH STENT PLACEMENT  11/2009   Drug eluting stent to left main artery: 4.0 X 12 mm Ion   . CORONARY ARTERY BYPASS GRAFT  2004   "CABG X4"  . ESOPHAGOGASTRODUODENOSCOPY N/A 02/14/2017   Procedure: ESOPHAGOGASTRODUODENOSCOPY (EGD);  Surgeon: Doran Stabler, MD;  Location: [facility] ENDOSCOPY;  Service: Endoscopy;  Laterality: N/A;  . GIVENS CAPSULE STUDY N/A 02/15/2017   Procedure: GIVENS CAPSULE STUDY;  Surgeon: Doran Stabler, MD;  Location: [place];  Service: Endoscopy;  Laterality: N/A;  . GROIN MASS OPEN BIOPSY  2004  . IR IVC FILTER PLMT / S&I /IMG GUID/MOD SED  03/14/2018  . IR PARACENTESIS  03/10/2018  . JOINT REPLACEMENT    . LAPAROSCOPIC CHOLECYSTECTOMY    . PERIPHERAL VASCULAR INTERVENTION Left 03/05/2018   Procedure: PERIPHERAL VASCULAR INTERVENTION;  Surgeon: Elam Dutch, MD;  Location: [place] CV LAB;  Service: Cardiovascular;  Laterality: Left;  Attempted unsuccess\ful Per Dr. Eden Lathe  . PERIPHERAL VASCULAR INTERVENTION  03/08/2018   Procedure: PERIPHERAL VASCULAR INTERVENTION;  Surgeon:  Waynetta Sandy, MD;  Location: [place] CV [facility];  Service: Cardiovascular;;  SMA Stent   . REPLACEMENT TOTAL KNEE BILATERAL Bilateral 2012  . REVERSE SHOULDER ARTHROPLASTY  02/26/2012   Procedure: REVERSE SHOULDER ARTHROPLASTY;  Surgeon: Marin Shutter, MD;  Location: [place];  Service: Orthopedics;  Laterality: Right;  RIGHT SHOULDER REVERSED ARTHROPLASTY  . SHOULDER ARTHROSCOPY WITH SUBACROMIAL DECOMPRESSION Left 02/10/2013   Procedure: SHOULDER ARTHROSCOPY WITH SUBACROMIAL DECOMPRESSION DISTAL CLAVICLE RESECTION;  Surgeon: [place] Shutter, MD;  Location: [place];  Service: Orthopedics;  Laterality:  Left;  DISTAL CLAVICLE RESECTION  . TONSILLECTOMY    . TOTAL HIP ARTHROPLASTY  2007   Right  . TOTAL HIP ARTHROPLASTY Left 02/07/2020   Procedure: TOTAL HIP ARTHROPLASTY ANTERIOR APPROACH;  Surgeon: Paralee Cancel, MD;  Location: [facility];  Service: Orthopedics;  Laterality: Left;  70 mins  . TRANSURETHRAL RESECTION OF BLADDER TUMOR N/A 05/31/2018   Procedure: TRANSURETHRAL RESECTION OF BLADDER TUMOR (TURBT);  Surgeon: Lucas Mallow, MD;  Location: [facility];  Service: Urology;  Laterality: N/A;  . VISCERAL ANGIOGRAPHY N/A 03/05/2018   Procedure: VISCERAL ANGIOGRAPHY;  Surgeon: Elam Dutch, MD;  Location: [place] [facility];  Service: Cardiovascular;  Laterality: N/A;  . VISCERAL ANGIOGRAPHY N/A 03/08/2018   Procedure: VISCERAL ANGIOGRAPHY;  Surgeon: Waynetta Sandy, MD;  Location: [place] [facility];  Service: Cardiovascular;  Laterality: N/A;     Medications Prior to Admission: Prior to Admission medications   Medication Sig Start Date End Date Taking? Authorizing Provider  allopurinol (ZYLOPRIM) 100 MG tablet Take 100 mg by mouth daily. 01/30/17   [provider]  ARIPiprazole (ABILIFY) 2 MG tablet Take 2 mg by mouth daily. 12/29/19   [provider]  aspirin EC 81 MG tablet Take 81 mg by mouth daily. Swallow whole.    [provider]  atorvastatin  (LIPITOR) 20 MG tablet Take 20 mg by mouth daily. 09/21/20   [provider]  citalopram (CELEXA) 40 MG tablet Take 40 mg by mouth at bedtime. 08/30/19   [provider]  ferrous sulfate (FERROUSUL) 325 (65 FE) MG tablet Take 1 tablet (325 mg total) by mouth 3 (three) times daily with meals for 14 days. 02/08/20 11/06/20  Danae Orleans, PA-C  gabapentin (NEURONTIN) 100 MG capsule Take 100 mg by mouth daily. 01/09/20   [provider]  levothyroxine (SYNTHROID) 50 MCG tablet Take 1 tablet (50 mcg total) by mouth daily at 6 (six) AM. 09/13/20   Fay Records, MD  lisinopril (ZESTRIL) 2.5 MG tablet Take 2.5 mg by mouth daily. 11/21/20   [provider]  magnesium oxide (MAG-OX) 400 (241.3 Mg) MG tablet Take 1 tablet (400 mg total) by mouth 2 (two) times daily. 03/26/18   Mikhail, Velta Addison, DO  nitroGLYCERIN (NITROSTAT) 0.4 MG SL tablet Place 1 tablet (0.4 mg total) under the tongue every 5 (five) minutes as needed for chest pain. For chest pain 04/24/16   Fay Records, MD  omeprazole (PRILOSEC) 40 MG capsule TAKE 1 CAPSULE BY MOUTH 2 TIMES DAILY. 12/20/20   Esterwood, Amy S, PA-C  OXYGEN Inhale 4 L/min into the lungs as needed (DURING ALL TIMES OF EXERTION).     [provider]  pilocarpine (PILOCAR) 1 % ophthalmic solution Place 1 drop into both eyes 2 (two) times daily.    [provider]  pramipexole (MIRAPEX) 0.25 MG tablet Take 0.25 mg by mouth every evening.  08/22/20   [provider]  roflumilast (DALIRESP) 500 MCG TABS tablet Take 500 mcg by mouth every evening.     [provider]  spironolactone (ALDACTONE) 25 MG tablet TAKE 1 TABLET BY MOUTH DAILY 12/17/20   Fay Records, MD  traZODone (DESYREL) 50 MG tablet Take 150 mg by mouth at bedtime.  10/07/18   [provider]  XTAMPZA ER 9 MG C12A Take 9 mg by mouth every 8 (eight) hours. 09/25/20   [provider]     Allergies:    Allergies  Allergen Reactions  .  Ativan [Lorazepam] Other (See Comments)    Confusion   . Pitavastatin Other (See Comments)    LIVALO: reaction not recalled  . Ropinirole Nausea Only and Other (See Comments)    Makes legs jump, also  . Zofran [Ondansetron Hcl] Nausea Only  . Zolpidem Tartrate Anxiety and Other (See Comments)    CONFUSION   . Penicillins Rash and Other (See Comments)    Tolerated cephalosporins in past Has patient had a PCN reaction causing immediate rash, facial/tongue/throat swelling, SOB or lightheadedness with hypotension: Yes Has patient had a PCN reaction causing severe rash involving mucus membranes or skin necrosis: No Has patient had a PCN reaction that required hospitalization: No Has patient had a PCN reaction occurring within the last 10 years: No If all of the above answers are "NO", then may proceed with Cephalosporin use.     Social History:   Social History   Socioeconomic History  . Marital status: Widowed    Spouse name: Not on file  . Number of children: 3  . Years of education: Not on file  . Highest education level: Not on file  Occupational History  . Occupation: retired    Comment: Electronics engineer: RETIRED  Tobacco Use  . Smoking status: Former Smoker    Packs/day: 1.00    Years: 45.00    Pack years: 45.00    Types: Cigarettes    Quit date: 12/22/2000    Years since quitting: 20.1  . Smokeless tobacco: Never Used  Vaping Use  . Vaping Use: Never used  Substance and Sexual Activity  . Alcohol use: Never    Alcohol/week: 0.0 standard drinks  . Drug use: Never  . Sexual activity: Not Currently  Other Topics Concern  . Not on file  Social History Narrative      3 grown children.   Social Determinants of Health   Financial Resource Strain: Not on file  Food Insecurity: No Food Insecurity  . Worried About Charity fundraiser in the Last Year: Never true  . Ran Out of Food in the Last Year: Never true  Transportation Needs: No Transportation Needs  .  Lack of Transportation (Medical): No  . Lack of Transportation (Non-Medical): No  Physical Activity: Not on file  Stress: Not on file  Social Connections: Not on file  Intimate Partner Violence: Not on file    Family History:   The patient's family history includes COPD in her father; Colitis in her sister; Heart disease in her father and sister; Lung cancer in her father; Ovarian cancer in her mother; Stomach cancer in her sister.    ROS:  Please see the history of present illness.  All other ROS reviewed and negative.     Physical Exam/Data:   Vitals:   02/04/21 1236 02/04/21 1238  BP: (!) 152/93 (!) 152/93  Pulse: 86 89  Resp: 20 18  Temp: 98.7 F (37.1 C) 98.7 F (37.1 C)  TempSrc: Oral Oral  SpO2: 94% 95%  Weight:  75.6 kg  Height:  5\' 8"  (1.727 m)   No intake or output data in the 24 hours ending 02/04/21 1328 Last 3 Weights 02/04/2021 02/04/2021 11/06/2020  Weight (lbs) 166 lb 10.7 oz 168 lb 12.8 oz 172 lb  Weight (kg) 75.6 kg 76.567 kg 78.019 kg     Body mass index is 25.34 kg/m.  BP 118/82  P 91   General:  Pt in mild distress with SOB  HEENT: normal Lymph: no adenopathy Neck: JVP is increased   Endocrine:  No thryomegaly Vascular: No carotid bruits; FA pulses 2+ bilaterally without bruits  Cardiac:  normal S1, S2; RRR; no murmur  Lungs:Initial decreased airflow bilaterally with wheezes (upper airway) and use of accessory muscles;   With sitting air movememnt improved some   Abd: soft, nontender, no hepatomegaly  Ext: 1-2+ edema Musculoskeletal:  No deformities, BUE and BLE strength normal and equal Skin: warm and dry  Neuro:  CNs 2-12 intact, no focal abnormalities noted Psych:  Normal affect    EKG:  The ECG that was not done today   Relevant CV Studies:  Echocardiogram 10/09/2020: Impressions: 1. Diastology suggestive of impaired LV filling. Left ventricular  ejection fraction, by estimation, is 60 to 65%. The left ventricle has  normal  function. The left ventricle has no regional wall motion  abnormalities. There is moderate concentric left  ventricular hypertrophy. Left ventricular diastolic parameters are  indeterminate.  2. Right ventricular systolic function is low normal. The right  ventricular size is normal. There is normal pulmonary artery systolic  pressure.  3. Left atrial size was moderately dilated.  4. Right atrial size was moderately dilated.  5. The mitral valve is myxomatous. No evidence of mitral valve  regurgitation. The mean mitral valve gradient is 5.0 mmHg with average  heart rate of 87 bpm. Moderate mitral annular calcification.  6. The aortic valve is tricuspid. Aortic valve regurgitation is not  visualized.  7. The inferior vena cava is dilated in size with <50% respiratory  variability, suggesting right atrial pressure of 15 mmHg.   Comparison(s): A prior study was performed on 10/05/2019.   Laboratory Data:  High Sensitivity Troponin:  No results for input(s): TROPONINIHS in the last 720 hours.    ChemistryNo results for input(s): NA, K, CL, CO2, GLUCOSE, BUN, CREATININE, CALCIUM, GFRNONAA, GFRAA, ANIONGAP in the last 168 hours.  No results for input(s): PROT, ALBUMIN, AST, ALT, ALKPHOS, BILITOT in the last 168 hours. HematologyNo results for input(s): WBC, RBC, HGB, HCT, MCV, MCH, MCHC, RDW, PLT in the last 168 hours. BNPNo results for input(s): BNP, PROBNP in the last 168 hours.  DDimer No results for input(s): DDIMER in the last 168 hours.   Radiology/Studies:  No results found.   Assessment and Plan:   1. Acute on chronic diastolic CHF   PT is very SOB today   Has some wheezing when she got back from the bathroom   INitally airflow was down   Imprved while siting   Does have wheezing   Neck veins are increased  WIll admit for IV diuresis with IV lasix.   Again, need to clarify meds (? Was lasix d/c'd and why.  Pt says it was)  With dyspnea and wheezing will empirically  place on solumedrol (dose x 1)  Follow response and continue if needs    Rx Xopenex.    2  CAD   Pt with last intervention in 2010  Myovue in 2018 withut significant ischemia   She had some chest tightness but I think may be related to volume / pulmonary Follow as diurese  3  HTN   FOllow BP as diurese  4  COPD  Pt on O2 athome   WIll need get respiratory therapy for nebs in hosp  See above      For questions or updates, please contact [facility] Please consult [email or web address] for contact info under     Signed,  Dorris Carnes, MD  02/04/2021 1:28 PM

## 2021-02-04 NOTE — Progress Notes (Addendum)
Dr. Alan Ripper H&P is in chart  Listed as not complete, but under chart review and Notes.  Pt's DDimer was elevated discussed with Dr. Harrington Challenger and will check VQ tonight but in meantime add IV heparin, if VQ neg can change to subcu Heparin.  Discussed VQ with pt.   Pt is stable in room, just does not feel well with SOB.  Will check screening Covid test as well.

## 2021-02-04 NOTE — Progress Notes (Signed)
ANTICOAGULATION CONSULT NOTE - Initial Consult  Pharmacy Consult for Heparin  Indication: rule out PE  Allergies  Allergen Reactions  . Ativan [Lorazepam] Other (See Comments)    Confusion   . Pitavastatin Itching    Reaction to Livalo  . Ropinirole Nausea Only and Other (See Comments)    Makes legs jump, also  . Zofran [Ondansetron Hcl] Nausea Only  . Zolpidem Tartrate Anxiety and Other (See Comments)    CONFUSION   . Penicillins Rash and Other (See Comments)    Tolerated cephalosporins in past Has patient had a PCN reaction causing immediate rash, facial/tongue/throat swelling, SOB or lightheadedness with hypotension: Yes Has patient had a PCN reaction causing severe rash involving mucus membranes or skin necrosis: No Has patient had a PCN reaction that required hospitalization: No Has patient had a PCN reaction occurring within the last 10 years: No If all of the above answers are "NO", then may proceed with Cephalosporin use.     Patient Measurements: Height: 5\' 8"  (172.7 cm) Weight: 75.6 kg (166 lb 10.7 oz) IBW/kg (Calculated) : 63.9  Vital Signs: Temp: 98.5 F (36.9 C) (02/14 2103) Temp Source: Oral (02/14 2103) BP: 106/54 (02/14 2103) Pulse Rate: 92 (02/14 2103)  Labs: Recent Labs    02/04/21 1314 02/04/21 1358  HGB 10.0*  --   HCT 32.7*  --   PLT 188  --   CREATININE 1.31* 1.27*    Estimated Creatinine Clearance: 35.6 mL/min (A) (by C-G formula based on SCr of 1.27 mg/dL (H)).   Medical History: Past Medical History:  Diagnosis Date  . Anemia    bld. transfusion post lumbar surgery- 2012  . Anxiety   . Arthralgia    NOS  . Blood transfusion 2012; 02/2018   WITH BACK SURGERY; "related to hematoma in stomach" (10/06/2018)  . CAD (coronary artery disease)    s/p CABG 2004; s/p DES to LM in 2010;  [place] 10/29/11: EF 50-55%, mild elevated filling pressures, no pulmonary HTN, LM 90% ISR, LAD and CFX occluded, S-RI occluded (old), S-OM3 ok and L-LAD ok,  native nondominant RCA 95% -  med rx recommended ; Lexiscan Myoview 7/13 at [facility]: demonstrated "normal LV function, anterior attenuation and localized ischemia, inferior, basilar, mid section"  . Carotid artery disease ([place])    Carotid US 4/74:  RICA 2-59; LICA 56-38; R subclavian stenosis - Repeat 1 year. // Carotid US 05/2019: R 1-39; L 40-59; R vertebral with atypical antegrade flow; R subclavian stenosis >> repeat 1 year // Carotid US 7/21: Bilat 40-59; L subclavian stenosis   . CHF (congestive heart failure) ([place]) 01/21/2020  . Chronic diastolic heart failure (HCC)    Echo 9/10: EF 75-64%, grade 1 diastolic dysfunction  . Chronic lower back pain   . COPD (chronic obstructive pulmonary disease) ([place])    Emphysema dxed by Dr. Woody Seller in [place] based on PFTs per pt in 2006; placed on albuterol  . Depression    TAKES CELEXA  AND  (OFF- WELBUTRIN)  . Diuretic-induced hypokalemia 10/08/2018  . DVT of lower extremity (deep venous thrombosis) (HCC)    recurrent. bilateral (2 episodes)  . Dyspnea    home o2 when needed  . Dysrhythmia    afib with cabg  . Exertional angina (HCC)    Treated with Isosorbide, Ranexa, amlodipine; intolerant to metoprolol  . GERD (gastroesophageal reflux disease)   . Gout    "on daily RX" (10/06/2018)  . HLD (hyperlipidemia)   . Hypertension   .  Hyperthyroidism   . L Subclavian Artery Stenosis    Carotid US 7/21   . Obesity (BMI 30-39.9) 2009   BMI 33  . Osteoarthritis    "all over; hands" (10/06/2018)  . Oxygen deficiency   . Oxygen dependent    4L at home as needed (10/06/2018)  . Pneumonia   . PVD (peripheral vascular disease) ([place])   . Sleep apnea 2012   USED CPAP THEN  STARTED USING SPIRIVA , Nov. 2013- last evaluation , changed from mask to aparatus that is just for her nose.. ; reports 05-28-18 "the mask smothers me so i dont use it right now" (10/06/2018)   Assessment: 80 y/o F with elevated D-dimer, shortness of breath, starting heparin  and getting VQ, Hgb 10, Scr 1.27.  Goal of Therapy:  Heparin level 0.3-0.7 units/ml Monitor platelets by anticoagulation protocol: Yes   Plan:  Heparin 2000 units BOLUS (reduced bolus with recent subcutaneous heparin) Start heparin drip at 850 units/hr 0700 Heparin level Daily CBC/Heparin level Monitor for bleeding  Narda Bonds, PharmD, BCPS Clinical Pharmacist Phone: [phone]

## 2021-02-05 ENCOUNTER — Observation Stay (HOSPITAL_COMMUNITY): Payer: Medicare HMO

## 2021-02-05 DIAGNOSIS — R079 Chest pain, unspecified: Secondary | ICD-10-CM | POA: Diagnosis not present

## 2021-02-05 DIAGNOSIS — I5031 Acute diastolic (congestive) heart failure: Secondary | ICD-10-CM

## 2021-02-05 DIAGNOSIS — I2510 Atherosclerotic heart disease of native coronary artery without angina pectoris: Secondary | ICD-10-CM | POA: Diagnosis not present

## 2021-02-05 DIAGNOSIS — R0602 Shortness of breath: Secondary | ICD-10-CM | POA: Diagnosis not present

## 2021-02-05 DIAGNOSIS — I509 Heart failure, unspecified: Secondary | ICD-10-CM | POA: Diagnosis not present

## 2021-02-05 DIAGNOSIS — R7989 Other specified abnormal findings of blood chemistry: Secondary | ICD-10-CM | POA: Diagnosis not present

## 2021-02-05 LAB — CBC
HCT: 31 % — ABNORMAL LOW (ref 36.0–46.0)
Hemoglobin: 9.3 g/dL — ABNORMAL LOW (ref 12.0–15.0)
MCH: 26.7 pg (ref 26.0–34.0)
MCHC: 30 g/dL (ref 30.0–36.0)
MCV: 89.1 fL (ref 80.0–100.0)
Platelets: 178 10*3/uL (ref 150–400)
RBC: 3.48 MIL/uL — ABNORMAL LOW (ref 3.87–5.11)
RDW: 15.4 % (ref 11.5–15.5)
WBC: 9.6 10*3/uL (ref 4.0–10.5)
nRBC: 0 % (ref 0.0–0.2)

## 2021-02-05 LAB — BASIC METABOLIC PANEL
Anion gap: 13 (ref 5–15)
BUN: 25 mg/dL — ABNORMAL HIGH (ref 8–23)
CO2: 29 mmol/L (ref 22–32)
Calcium: 8.9 mg/dL (ref 8.9–10.3)
Chloride: 99 mmol/L (ref 98–111)
Creatinine, Ser: 1.52 mg/dL — ABNORMAL HIGH (ref 0.44–1.00)
GFR, Estimated: 34 mL/min — ABNORMAL LOW (ref >60)
Glucose, Bld: 200 mg/dL — ABNORMAL HIGH (ref 70–99)
Potassium: 4.2 mmol/L (ref 3.5–5.1)
Sodium: 141 mmol/L (ref 135–145)

## 2021-02-05 LAB — HEPARIN LEVEL (UNFRACTIONATED)
Heparin Unfractionated: 0.38 IU/mL (ref 0.30–0.70)
Heparin Unfractionated: 0.23 IU/mL — ABNORMAL LOW (ref 0.30–0.70)

## 2021-02-05 LAB — SARS CORONAVIRUS 2 BY RT PCR (HOSPITAL ORDER, PERFORMED IN ~~LOC~~ HOSPITAL LAB): SARS Coronavirus 2: NEGATIVE

## 2021-02-05 LAB — CREATININE, SERUM
Creatinine, Ser: 1.43 mg/dL — ABNORMAL HIGH (ref 0.44–1.00)
GFR, Estimated: 37 mL/min — ABNORMAL LOW (ref >60)

## 2021-02-05 MED ORDER — HEPARIN BOLUS VIA INFUSION
1000.0000 [IU] | Freq: Once | INTRAVENOUS | Status: DC
  Filled 2021-02-05: qty 1000

## 2021-02-05 MED ORDER — LEVALBUTEROL HCL 0.63 MG/3ML IN NEBU: 0.6300 mg | INHALATION_SOLUTION | Freq: Four times a day (QID) | RESPIRATORY_TRACT | Status: DC | PRN

## 2021-02-05 MED ORDER — TECHNETIUM TO 99M ALBUMIN AGGREGATED
4.4000 | Freq: Once | INTRAVENOUS | Status: AC | PRN
  Administered 2021-02-05: 4.4 via INTRAVENOUS

## 2021-02-05 MED ORDER — ARIPIPRAZOLE 2 MG PO TABS
1.0000 mg | ORAL_TABLET | Freq: Every day | ORAL | Status: DC
Start: 2021-02-06 — End: 2021-02-07
  Administered 2021-02-06 – 2021-02-07 (×2): 1 mg via ORAL
  Filled 2021-02-05 (×2): qty 1

## 2021-02-05 MED ORDER — HEPARIN SODIUM (PORCINE) 5000 UNIT/ML IJ SOLN
5000.0000 [IU] | Freq: Three times a day (TID) | INTRAMUSCULAR | Status: DC
  Administered 2021-02-05 – 2021-02-07 (×5): 5000 [IU] via SUBCUTANEOUS
  Filled 2021-02-05 (×5): qty 1

## 2021-02-05 MED ORDER — HEPARIN SODIUM (PORCINE) 5000 UNIT/ML IJ SOLN: 5000.0000 [IU] | Freq: Three times a day (TID) | INTRAMUSCULAR | Status: DC

## 2021-02-05 MED ORDER — FUROSEMIDE 10 MG/ML IJ SOLN
40.0000 mg | Freq: Two times a day (BID) | INTRAMUSCULAR | Status: DC
  Administered 2021-02-05 – 2021-02-06 (×3): 40 mg via INTRAVENOUS
  Filled 2021-02-05 (×3): qty 4

## 2021-02-05 NOTE — Progress Notes (Signed)
Awaiting for cardiology to confirm no blood clots to d/c PE. Orders from. Rhonda Barrett, PA-c to keep Heparin gtt at current rate, do not bolus and do not increase rate.

## 2021-02-05 NOTE — Progress Notes (Signed)
[place] for Heparin >> heparin SQ Indication: rule out PE >> VTE propohylaxis  Allergies  Allergen Reactions  . Ativan [Lorazepam] Other (See Comments)    Confusion   . Pitavastatin Itching    Reaction to Livalo  . Ropinirole Nausea Only and Other (See Comments)    Makes legs jump, also  . Zofran [Ondansetron Hcl] Nausea Only  . Zolpidem Tartrate Anxiety and Other (See Comments)    CONFUSION   . Penicillins Rash and Other (See Comments)    Tolerated cephalosporins in past Has patient had a PCN reaction causing immediate rash, facial/tongue/throat swelling, SOB or lightheadedness with hypotension: Yes Has patient had a PCN reaction causing severe rash involving mucus membranes or skin necrosis: No Has patient had a PCN reaction that required hospitalization: No Has patient had a PCN reaction occurring within the last 10 years: No If all of the above answers are "NO", then may proceed with Cephalosporin use.     Patient Measurements: Height: 5\' 8"  (172.7 cm) Weight: 74.4 kg (164 lb) IBW/kg (Calculated) : 63.9  Vital Signs: Temp: 98.2 F (36.8 C) (02/15 1600) Temp Source: Oral (02/15 1600) BP: 107/65 (02/15 1600) Pulse Rate: 93 (02/15 1600)  Labs: Recent Labs    02/04/21 1314 02/04/21 1358 02/05/21 0650 02/05/21 1221  HGB 10.0*  --   --   --   HCT 32.7*  --   --   --   PLT 188  --   --   --   HEPARINUNFRC  --   --  0.38 0.23*  CREATININE 1.31* 1.27* 1.52*  --     Estimated Creatinine Clearance: 29.8 mL/min (A) (by C-G formula based on SCr of 1.52 mg/dL (H)).  Assessment: 80 y/o F with elevated D-dimer, shortness of breath, starting IV heparin to r/o PE. VQ scan showed no convincing evidence of acute PE. Pharmacy consulted by Cardiology to d/c heparin drip and transition to heparin SQ for VTE prophylaxis. Hg 10, plt wnl. No active bleed issues reported.  Goal of Therapy:  Heparin level 0.3-0.7 units/ml Monitor platelets by  anticoagulation protocol: Yes   Plan:  Heparin drip d/c'd. Start heparin 5000 units [place] q8h Monitor CBC, s/sx bleeding Pharmacy will sign off consult and monitor peripherally   Arturo Morton, PharmD, BCPS Please check AMION for all [place] contact numbers Clinical Pharmacist 02/05/2021 8:12 PM

## 2021-02-05 NOTE — Progress Notes (Addendum)
Progress Note  Patient Name: [name] Date of Encounter: 02/05/2021  Primary Cardiologist:  Dorris Carnes, MD  Subjective   Breathing better, still not back to baseline. Weighs daily and is compliant w/ meds but says her PCP took her off Lasix a while ago. Struggles with complicated med instructions.  Inpatient Medications    Scheduled Meds: . allopurinol  100 mg Oral Daily  . [START ON 02/06/2021] ARIPiprazole  1 mg Oral Daily  . aspirin EC  81 mg Oral Daily  . atorvastatin  20 mg Oral Daily  . citalopram  40 mg Oral QHS  . ferrous sulfate  325 mg Oral TID WC  . gabapentin  100 mg Oral Daily  . levothyroxine  50 mcg Oral Q0600  . lisinopril  2.5 mg Oral Daily  . magnesium oxide  400 mg Oral BID  . oxyCODONE  10 mg Oral Q8H  . pantoprazole  40 mg Oral Daily  . pilocarpine  1 drop Both Eyes BID  . pramipexole  0.25 mg Oral QPM  . roflumilast  500 mcg Oral QPM  . sodium chloride flush  3 mL Intravenous Q12H  . spironolactone  25 mg Oral Daily  . traZODone  150 mg Oral QHS   Continuous Infusions: . sodium chloride    . heparin 850 Units/hr (02/05/21 0800)   PRN Meds: sodium chloride, acetaminophen, levalbuterol, nitroGLYCERIN, ondansetron (ZOFRAN) IV, sodium chloride flush   Vital Signs    Vitals:   02/04/21 2240 02/05/21 0047 02/05/21 0443 02/05/21 0800  BP:  (!) 117/59 125/60 (!) 141/66  Pulse: 86 80 86 92  Resp: 18 16 20 20   Temp:  98.4 F (36.9 C) 98.2 F (36.8 C) 98 F (36.7 C)  TempSrc:  Oral Oral Oral  SpO2: 97% 98% 100% 97%  Weight:   74.4 kg   Height:        Intake/Output Summary (Last 24 hours) at 02/05/2021 1009 Last data filed at 02/05/2021 0800 Gross per 24 hour  Intake 964.25 ml  Output 2150 ml  Net -1185.75 ml   Filed Weights   02/04/21 1238 02/05/21 0443  Weight: 75.6 kg 74.4 kg   Last Weight  Most recent update: 02/05/2021  4:45 AM   Weight  74.4 kg (164 lb)           Weight change:    Telemetry    SR, PVCs -  Personally Reviewed  ECG    None today - Personally Reviewed  Physical Exam   General: Well developed, frail, elderly, female appearing in no acute distress. Head: Normocephalic, atraumatic.  Neck: Supple without bruits, JVD to jaw. Lungs:  Resp regular and unlabored, decreased BS bases w/ some rales. Heart: RRR, S1, S2, no S3, S4, 2/6 murmur; no rub. Abdomen: Soft, non-tender, non-distended with normoactive bowel sounds. No hepatomegaly. No rebound/guarding. No obvious abdominal masses. Extremities: No clubbing, cyanosis, trace edema. Distal pedal pulses are 1-2+ bilaterally. Neuro: Alert and oriented X 3. Moves all extremities spontaneously. Psych: Normal affect.  Labs    Hematology Recent Labs  Lab 02/04/21 1314  WBC 8.1  RBC 3.72*  HGB 10.0*  HCT 32.7*  MCV 87.9  MCH 26.9  MCHC 30.6  RDW 15.5  PLT 188    Chemistry Recent Labs  Lab 02/04/21 1314 02/04/21 1358 02/05/21 0650  NA  --  140 141  K  --  3.5 4.2  CL  --  100 99  CO2  --  31 29  GLUCOSE  --  87 200*  BUN  --  26* 25*  CREATININE 1.31* 1.27* 1.52*  CALCIUM  --  9.2 8.9  PROT  --  6.8  --   ALBUMIN  --  3.4*  --   AST  --  17  --   ALT  --  12  --   ALKPHOS  --  61  --   BILITOT  --  0.5  --   GFRNONAA 41* 43* 34*  ANIONGAP  --  9 13     High Sensitivity Troponin:  No results for input(s): TROPONINIHS in the last 720 hours.    BNP Recent Labs  Lab 02/04/21 1358  BNP 345.4*     DDimer  Recent Labs  Lab 02/04/21 1358  DDIMER 1.64*     Radiology    DG Chest 1 View  Result Date: 02/05/2021 CLINICAL DATA:  Heart failure with preserved ejection fraction, correlation with V/Q scan, shortness of breath, LEFT central chest pain for 1 month, history coronary artery disease post stenting and CABG EXAM: CHEST  1 VIEW COMPARISON:  Portable exam 0844 hours compared to 10/28/2020 FINDINGS: Normal heart size post CABG. Mediastinal contours and pulmonary vascularity normal. Atherosclerotic  calcification aorta. Lungs clear. No infiltrate, pleural effusion, or pneumothorax. Osseous demineralization with prior cervical fusion and RIGHT shoulder arthroplasty. Capsular calcification at BILATERAL breast prostheses. Chronic LEFT rotator cuff tear. IMPRESSION: Post CABG. No acute abnormalities. Aortic Atherosclerosis (ICD10-I70.0). Electronically Signed   By: Lavonia Dana M.D.   On: 02/05/2021 09:14     Cardiac Studies   ECHO:  10/09/2020 1. Diastology suggestive of impaired LV filling. Left ventricular  ejection fraction, by estimation, is 60 to 65%. The left ventricle has  normal function. The left ventricle has no regional wall motion  abnormalities. There is moderate concentric left  ventricular hypertrophy. Left ventricular diastolic parameters are  indeterminate.  2. Right ventricular systolic function is low normal. The right  ventricular size is normal. There is normal pulmonary artery systolic  pressure.  3. Left atrial size was moderately dilated.  4. Right atrial size was moderately dilated.  5. The mitral valve is myxomatous. No evidence of mitral valve  regurgitation. The mean mitral valve gradient is 5.0 mmHg with average  heart rate of 87 bpm. Moderate mitral annular calcification.  6. The aortic valve is tricuspid. Aortic valve regurgitation is not  visualized.  7. The inferior vena cava is dilated in size with <50% respiratory  variability, suggesting right atrial pressure of 15 mmHg.   Patient Profile     80 y.o. female w/ hx CAD (s/p CABG 2004; DES to LM in 2010; myovue in 2018:  Low risk with distal anterior and apical ischemia), chronic diastolic CHF, CV dz, PAD recurrent DVT/PE, GI bleeding on anticoagulation  COPD, PAF (after CABG), GERD, HL, HTN, renal insufficiency, sleep apnea (not on CPAP), was admitted from the office 02/14 with SOB.  PCP: [name], MD  Assessment & Plan    1. Acute on chronic diastolic CHF - pt says gained 6 lbs in  one night - not back to baseline yet, got Lasix 80 mg as a 1 time dose - will start 40 mg IV bid - confusion about meds, she will do better with a simpler regimen - says PCP stopped her Lasix, pt not sure why (?decreased renal function) - has spiro and metolazone on her med list, neither for now - Will contact PCP and see  what rx was changed >> they will fax records  2. CKD III - follow daily w/ diuresis, Cr as high as 2.20 in November 2021, but generally about 1.3 or less - Hold Lisinopril and spiro w/ diuresis and worsening renal function  3. ?PE - VQ scan performed, results pending - currently on heparin  4. COPD - no wheezing now - got 1 dose solu-medrol on admit, has prn xopenex nebs ordered   Active Problems:   CHF (congestive heart failure), NYHA class III, acute, diastolic (HCC)    Signed, Rosaria Ferries , PA-C 10:09 AM 02/05/2021 Pager: [phone]  Patient seen and examined with Rosaria Ferries PA-C.  Agree as above, with the following exceptions and changes as noted below. [name] was admitted from clinic for severe dyspnea. V/Q scan negative for PE and she is chronically on oxygen for COPD. Gen: NAD, CV: RRR, 2/6 SEM, Lungs: clear, Abd: soft, Extrem: Warm, well perfused, trivial edema, Neuro/Psych: alert and oriented x 3, normal mood and affect. All available labs, radiology testing, previous records reviewed.   As above, we will continue IV lasix today. She has labile symptoms on oral lasix and may not need long term daily lasix but we will reassess day by day.  Elouise Munroe, MD 02/05/21 4:45 PM

## 2021-02-05 NOTE — Progress Notes (Signed)
Heparin stopped at this time per Rosaria Ferries, PA-C until further orders received.

## 2021-02-05 NOTE — Progress Notes (Signed)
Heart Failure Stewardship Pharmacist Progress Note   PCP: [name], MD PCP-Cardiologist: Dorris Carnes, MD    HPI:  80 yo F with PMH of CAD, CHF, DVT/PE, GI bleeding, afib, and COPD. She was admitted on 02/04/21 with shortness of breath and edema. Her last ECHO was done on 10/09/20 and LVEF was 60-65% (was 55-60% in October 2020).  She reports she has not been taking her lasix, however her last lasix prescription was dispensed on 01/26/21 from [facility] - called and confirmed this was picked up on 01/26/21. Her grandaughter fills her prescriptions for her in her pill boxes. She confirmed that the last dose of lasix was taken on 2/14, however, there was a note that the spironolactone was removed from the pill containers due to "being stopped by the hospital doctor in October or November." Looking back in her records, this was discontinued on her discharge date 10/13/20, but then was reordered on 12/17/20 by Dr. Harrington Challenger.  Current HF Medications: Lisinopril 2.5 mg daily Spironolactone 25 mg daily  Prior to admission HF Medications: Furosemide 80 mg daily on Tues/Thurs, 40 mg daily all other days Metolazone 2.5 mg daily on Mon/Sat Lisinopril 2.5 mg daily  Pertinent Lab Values: . Serum creatinine 1.52, BUN 25, Potassium 4.2, Sodium 141, BNP 345.4  Vital Signs: . Weight: 164 lbs (admission weight: 166 lbs) . Blood pressure: 120-140/60s  . Heart rate: 80-100s   Medication Assistance / Insurance Benefits Check: Does the patient have prescription insurance?  Yes Type of insurance plan: [employer] Medicare  Does the patient qualify for medication assistance through manufacturers or grants?   Pending . Eligible grants and/or patient assistance programs: pending . Medication assistance applications in progress: none  . Medication assistance applications approved: none Approved medication assistance renewals will be completed by: [place]:  Prior to admission  outpatient pharmacy: [place] Is the patient willing to use [facility] at discharge? Pending Is the patient willing to transition their outpatient pharmacy to utilize a [facility] outpatient pharmacy?   No - receives pill packing with this pharmacy    Assessment: 1. Acute on chronic diastolic CHF (EF 88-91%). NYHA class II symptoms. - Last dose of IV lasix given on 02/04/21. Diuresis per cardiology. - Continue lisinopril 2.5 mg daily - Continue spironolactone 25 mg daily - Consider starting Jardiance 10 mg daily prior to discharge pending SCr trends   Plan: 1) Medication changes recommended at this time: - Continue current regimen; start Jardiance once SCr improved  2) Patient assistance: - Jardiance copay $45/month - can enroll in patient assistance if needed  3)  Education  - To be completed prior to discharge  Kerby Nora, PharmD, BCPS Heart Failure Stewardship Pharmacist Phone [phone]

## 2021-02-05 NOTE — Plan of Care (Signed)
  Problem: Education: Goal: Knowledge of General Education information will improve Description Including pain rating scale, medication(s)/side effects and non-pharmacologic comfort measures Outcome: Progressing   

## 2021-02-05 NOTE — Progress Notes (Addendum)
[place] for Heparin  Indication: rule out PE  Allergies  Allergen Reactions  . Ativan [Lorazepam] Other (See Comments)    Confusion   . Pitavastatin Itching    Reaction to Livalo  . Ropinirole Nausea Only and Other (See Comments)    Makes legs jump, also  . Zofran [Ondansetron Hcl] Nausea Only  . Zolpidem Tartrate Anxiety and Other (See Comments)    CONFUSION   . Penicillins Rash and Other (See Comments)    Tolerated cephalosporins in past Has patient had a PCN reaction causing immediate rash, facial/tongue/throat swelling, SOB or lightheadedness with hypotension: Yes Has patient had a PCN reaction causing severe rash involving mucus membranes or skin necrosis: No Has patient had a PCN reaction that required hospitalization: No Has patient had a PCN reaction occurring within the last 10 years: No If all of the above answers are "NO", then may proceed with Cephalosporin use.     Patient Measurements: Height: 5\' 8"  (172.7 cm) Weight: 74.4 kg (164 lb) IBW/kg (Calculated) : 63.9  Vital Signs: Temp: 98 F (36.7 C) (02/15 0800) Temp Source: Oral (02/15 0800) BP: 141/66 (02/15 0800) Pulse Rate: 92 (02/15 0800)  Labs: Recent Labs    02/04/21 1314 02/04/21 1358 02/05/21 0650  HGB 10.0*  --   --   HCT 32.7*  --   --   PLT 188  --   --   HEPARINUNFRC  --   --  0.38  CREATININE 1.31* 1.27* 1.52*    Estimated Creatinine Clearance: 29.8 mL/min (A) (by C-G formula based on SCr of 1.52 mg/dL (H)).  Assessment: 80 y/o F with elevated D-dimer, shortness of breath, starting IV heparin to r/o PE. VQ scan pending.  Heparin level is therapeutic at 0.38 on 850 units/hr. No bleeding noted, CBC is stable.  Goal of Therapy:  Heparin level 0.3-0.7 units/ml Monitor platelets by anticoagulation protocol: Yes   Plan:  Continue heparin drip at 850 units/hr Confirmatory heparin level ~13:00 Daily CBC/Heparin level Monitor for bleeding F/U VQ  scan results  Thank you for involving pharmacy in this patient's care.  Renold Genta, PharmD, BCPS Clinical Pharmacist Clinical phone for 02/05/2021 until 3p is [phone] 02/05/2021 9:25 AM  **Pharmacist phone directory can be found on [email or web address] listed under [place]**   Addendum: Confirmatory heparin level is subtherapeutic at 0.23 on 850 units/hr. No bleeding noted. VQ scan results still pending.  Heparin 1000 unit IV bolus then Increase heparin drip to 1000 units/hr 8h heparin level  Renold Genta, PharmD, BCPS 1:53 PM

## 2021-02-05 NOTE — Progress Notes (Signed)
Pt had episode of shortness of breath when getting to bedside commode. Pt reports it felt like it did at home and reason for coming to hospital. Pt educated on I&O status. Pt was negative I&O yesterday and +875ml today. Diet education provided on sodium provided. Pt has drank 2 mountain dews. Educated how this drink will make her hold on to fluid more due to soidum. Pt verbalizes understanding. Did notify MD on pt. Pt breathing improved after resting.

## 2021-02-05 NOTE — Progress Notes (Signed)
Reached out to pharmacy. Pts results from scan report no PE. Pharmacy is going to reach out to cardiology to confirm. Holding on Heparin bolus in increase at this time until further instruction

## 2021-02-06 DIAGNOSIS — Z86711 Personal history of pulmonary embolism: Secondary | ICD-10-CM | POA: Diagnosis not present

## 2021-02-06 DIAGNOSIS — G4730 Sleep apnea, unspecified: Secondary | ICD-10-CM | POA: Diagnosis present

## 2021-02-06 DIAGNOSIS — Z96611 Presence of right artificial shoulder joint: Secondary | ICD-10-CM | POA: Diagnosis present

## 2021-02-06 DIAGNOSIS — Z86718 Personal history of other venous thrombosis and embolism: Secondary | ICD-10-CM | POA: Diagnosis not present

## 2021-02-06 DIAGNOSIS — Z96653 Presence of artificial knee joint, bilateral: Secondary | ICD-10-CM | POA: Diagnosis present

## 2021-02-06 DIAGNOSIS — I25810 Atherosclerosis of coronary artery bypass graft(s) without angina pectoris: Secondary | ICD-10-CM | POA: Diagnosis present

## 2021-02-06 DIAGNOSIS — E785 Hyperlipidemia, unspecified: Secondary | ICD-10-CM | POA: Diagnosis present

## 2021-02-06 DIAGNOSIS — I5033 Acute on chronic diastolic (congestive) heart failure: Secondary | ICD-10-CM | POA: Diagnosis present

## 2021-02-06 DIAGNOSIS — I25119 Atherosclerotic heart disease of native coronary artery with unspecified angina pectoris: Secondary | ICD-10-CM | POA: Diagnosis present

## 2021-02-06 DIAGNOSIS — K219 Gastro-esophageal reflux disease without esophagitis: Secondary | ICD-10-CM | POA: Diagnosis present

## 2021-02-06 DIAGNOSIS — I480 Paroxysmal atrial fibrillation: Secondary | ICD-10-CM | POA: Diagnosis present

## 2021-02-06 DIAGNOSIS — I739 Peripheral vascular disease, unspecified: Secondary | ICD-10-CM | POA: Diagnosis present

## 2021-02-06 DIAGNOSIS — Z9981 Dependence on supplemental oxygen: Secondary | ICD-10-CM | POA: Diagnosis not present

## 2021-02-06 DIAGNOSIS — I509 Heart failure, unspecified: Secondary | ICD-10-CM | POA: Insufficient documentation

## 2021-02-06 DIAGNOSIS — F419 Anxiety disorder, unspecified: Secondary | ICD-10-CM | POA: Diagnosis present

## 2021-02-06 DIAGNOSIS — Z96643 Presence of artificial hip joint, bilateral: Secondary | ICD-10-CM | POA: Diagnosis present

## 2021-02-06 DIAGNOSIS — Z955 Presence of coronary angioplasty implant and graft: Secondary | ICD-10-CM | POA: Diagnosis not present

## 2021-02-06 DIAGNOSIS — N183 Chronic kidney disease, stage 3 unspecified: Secondary | ICD-10-CM | POA: Diagnosis present

## 2021-02-06 DIAGNOSIS — E039 Hypothyroidism, unspecified: Secondary | ICD-10-CM | POA: Diagnosis present

## 2021-02-06 DIAGNOSIS — I130 Hypertensive heart and chronic kidney disease with heart failure and stage 1 through stage 4 chronic kidney disease, or unspecified chronic kidney disease: Secondary | ICD-10-CM | POA: Diagnosis present

## 2021-02-06 DIAGNOSIS — F32A Depression, unspecified: Secondary | ICD-10-CM | POA: Diagnosis present

## 2021-02-06 DIAGNOSIS — Z981 Arthrodesis status: Secondary | ICD-10-CM | POA: Diagnosis not present

## 2021-02-06 DIAGNOSIS — Z79899 Other long term (current) drug therapy: Secondary | ICD-10-CM | POA: Diagnosis not present

## 2021-02-06 DIAGNOSIS — Z20822 Contact with and (suspected) exposure to covid-19: Secondary | ICD-10-CM | POA: Diagnosis present

## 2021-02-06 DIAGNOSIS — M109 Gout, unspecified: Secondary | ICD-10-CM | POA: Diagnosis present

## 2021-02-06 LAB — BASIC METABOLIC PANEL
Anion gap: 8 (ref 5–15)
BUN: 33 mg/dL — ABNORMAL HIGH (ref 8–23)
CO2: 30 mmol/L (ref 22–32)
Calcium: 8.9 mg/dL (ref 8.9–10.3)
Chloride: 100 mmol/L (ref 98–111)
Creatinine, Ser: 1.4 mg/dL — ABNORMAL HIGH (ref 0.44–1.00)
GFR, Estimated: 38 mL/min — ABNORMAL LOW (ref >60)
Glucose, Bld: 97 mg/dL (ref 70–99)
Potassium: 4.5 mmol/L (ref 3.5–5.1)
Sodium: 138 mmol/L (ref 135–145)

## 2021-02-06 MED ORDER — FERROUS SULFATE 325 (65 FE) MG PO TABS
325.0000 mg | ORAL_TABLET | Freq: Three times a day (TID) | ORAL | Status: DC
  Administered 2021-02-06 – 2021-02-07 (×4): 325 mg via ORAL
  Filled 2021-02-06 (×4): qty 1

## 2021-02-06 NOTE — Progress Notes (Signed)
Progress Note  Patient Name: [name] Date of Encounter: 02/06/2021  Primary Cardiologist: Dorris Carnes, MD   Subjective   Feeling jittery after getting her inhaler but overall better than admission.   Inpatient Medications    Scheduled Meds: . allopurinol  100 mg Oral Daily  . ARIPiprazole  1 mg Oral Daily  . aspirin EC  81 mg Oral Daily  . atorvastatin  20 mg Oral Daily  . citalopram  40 mg Oral QHS  . ferrous sulfate  325 mg Oral TID WC  . furosemide  40 mg Intravenous BID  . gabapentin  100 mg Oral Daily  . heparin injection (subcutaneous)  5,000 Units Subcutaneous Q8H  . levothyroxine  50 mcg Oral Q0600  . magnesium oxide  400 mg Oral BID  . oxyCODONE  10 mg Oral Q8H  . pantoprazole  40 mg Oral Daily  . pilocarpine  1 drop Both Eyes BID  . pramipexole  0.25 mg Oral QPM  . roflumilast  500 mcg Oral QPM  . sodium chloride flush  3 mL Intravenous Q12H  . traZODone  150 mg Oral QHS   Continuous Infusions: . sodium chloride     PRN Meds: sodium chloride, acetaminophen, levalbuterol, nitroGLYCERIN, ondansetron (ZOFRAN) IV, sodium chloride flush   Vital Signs    Vitals:   02/05/21 0800 02/05/21 1600 02/05/21 2204 02/06/21 0514  BP: (!) 141/66 107/65  134/79  Pulse: 92 93 87 87  Resp: 20 16  17   Temp: 98 F (36.7 C) 98.2 F (36.8 C) 98.1 F (36.7 C) 97.9 F (36.6 C)  TempSrc: Oral Oral Oral Oral  SpO2: 97% 97% 97%   Weight:    76.1 kg  Height:        Intake/Output Summary (Last 24 hours) at 02/06/2021 1221 Last data filed at 02/06/2021 0900 Gross per 24 hour  Intake 1402.06 ml  Output 1100 ml  Net 302.06 ml   Filed Weights   02/04/21 1238 02/05/21 0443 02/06/21 0514  Weight: 75.6 kg 74.4 kg 76.1 kg    Telemetry    SR - Personally Reviewed  ECG    No new- Personally Reviewed  Physical Exam   GEN: No acute distress.   Neck: No JVD Cardiac: regular rhythm, normal rate, no murmurs, rubs, or gallops.  Respiratory: Clear to  auscultation bilaterally. GI: Soft, nontender, non-distended  MS: No edema; No deformity. Neuro:  Nonfocal  Psych: Normal affect   Labs    Chemistry Recent Labs  Lab 02/04/21 1358 02/05/21 0650 02/05/21 2031 02/06/21 0316  NA 140 141  --  138  K 3.5 4.2  --  4.5  CL 100 99  --  100  CO2 31 29  --  30  GLUCOSE 87 200*  --  97  BUN 26* 25*  --  33*  CREATININE 1.27* 1.52* 1.43* 1.40*  CALCIUM 9.2 8.9  --  8.9  PROT 6.8  --   --   --   ALBUMIN 3.4*  --   --   --   AST 17  --   --   --   ALT 12  --   --   --   ALKPHOS 61  --   --   --   BILITOT 0.5  --   --   --   GFRNONAA 43* 34* 37* 38*  ANIONGAP 9 13  --  8     Hematology Recent Labs  Lab 02/04/21 1314 02/05/21  2031  WBC 8.1 9.6  RBC 3.72* 3.48*  HGB 10.0* 9.3*  HCT 32.7* 31.0*  MCV 87.9 89.1  MCH 26.9 26.7  MCHC 30.6 30.0  RDW 15.5 15.4  PLT 188 178    Cardiac EnzymesNo results for input(s): TROPONINI in the last 168 hours. No results for input(s): TROPIPOC in the last 168 hours.   BNP Recent Labs  Lab 02/04/21 1358  BNP 345.4*     DDimer  Recent Labs  Lab 02/04/21 1358  DDIMER 1.64*     Radiology    DG Chest 1 View  Result Date: 02/05/2021 CLINICAL DATA:  Heart failure with preserved ejection fraction, correlation with V/Q scan, shortness of breath, LEFT central chest pain for 1 month, history coronary artery disease post stenting and CABG EXAM: CHEST  1 VIEW COMPARISON:  Portable exam 0844 hours compared to 10/28/2020 FINDINGS: Normal heart size post CABG. Mediastinal contours and pulmonary vascularity normal. Atherosclerotic calcification aorta. Lungs clear. No infiltrate, pleural effusion, or pneumothorax. Osseous demineralization with prior cervical fusion and RIGHT shoulder arthroplasty. Capsular calcification at BILATERAL breast prostheses. Chronic LEFT rotator cuff tear. IMPRESSION: Post CABG. No acute abnormalities. Aortic Atherosclerosis (ICD10-I70.0). Electronically Signed   By: Lavonia Dana M.D.   On: 02/05/2021 09:14   NM Pulmonary Perfusion  Result Date: 02/05/2021 CLINICAL DATA:  Concern for pulmonary embolism.  Elevated D-dimer. EXAM: NUCLEAR MEDICINE PERFUSION LUNG SCAN TECHNIQUE: Perfusion images were obtained in multiple projections after intravenous injection of radiopharmaceutical. RADIOPHARMACEUTICALS:  4.4 mCi Tc-51m MAA COMPARISON:  Chest x-ray 02/05/2021, V/Q scan 10/11/2020 FINDINGS: No large wedge-shaped peripheral perfusion defects to suggest acute pulmonary embolism. Several small defects noted in the posterior upper lobes on the LPO projection are favored relate to COPD. Findings are similar to comparison perfusion scan of 10/11/2020. IMPRESSION: No convincing evidence of acute pulmonary embolism. Upper lobe defects are favored related to COPD. Findings similar to comparison exam. Electronically Signed   By: Suzy Bouchard M.D.   On: 02/05/2021 14:00    Cardiac Studies   ECHO:  10/09/2020  1. Diastology suggestive of impaired LV filling. Left ventricular  ejection fraction, by estimation, is 60 to 65%. The left ventricle has  normal function. The left ventricle has no regional wall motion  abnormalities. There is moderate concentric left  ventricular hypertrophy. Left ventricular diastolic parameters are  indeterminate.   2. Right ventricular systolic function is low normal. The right  ventricular size is normal. There is normal pulmonary artery systolic  pressure.   3. Left atrial size was moderately dilated.   4. Right atrial size was moderately dilated.   5. The mitral valve is myxomatous. No evidence of mitral valve  regurgitation. The mean mitral valve gradient is 5.0 mmHg with average  heart rate of 87 bpm. Moderate mitral annular calcification.   6. The aortic valve is tricuspid. Aortic valve regurgitation is not  visualized.   7. The inferior vena cava is dilated in size with <50% respiratory  variability, suggesting right atrial pressure of  15 mmHg.    Patient Profile     80 y.o. female w/ hx CAD (s/p CABG 2004; DES to LM in 2010; myovue in 2018:  Low risk with distal anterior and apical ischemia), chronic diastolic CHF, CV dz, PAD recurrent DVT/PE, GI bleeding on anticoagulation  COPD, PAF (after CABG), GERD, HL, HTN, renal insufficiency, sleep apnea (not on CPAP), was admitted from the office 02/14 with SOB.   PCP: [name], MD  Assessment &  Plan   Active Problems:   CHF (congestive heart failure), NYHA class III, acute, diastolic (HCC)   CHF (congestive heart failure) ([place])   1. Acute on chronic diastolic CHF - pt says gained 6 lbs in one night prior to admission - lasix 40 mg IV bid, likely transition back to oral tomorrow. - confusion about meds, she will do better with a simpler regimen - has spiro and metolazone on her med list, neither for now   2. CKD III - follow daily w/ diuresis, Cr as high as 2.20 in November 2021, but generally about 1.3 or less - Hold Lisinopril and spiro w/ diuresis and worsening renal function   3. ?PE - VQ scan performed, negative for PE - heparin stopped.    4. COPD - no wheezing now - got 1 dose solu-medrol on admit, has prn xopenex nebs ordered      For questions or updates, please contact [place] Please consult [email or web address] for contact info under        Signed, Elouise Munroe, MD  02/06/2021, 12:21 PM

## 2021-02-06 NOTE — Progress Notes (Signed)
Heart Failure Stewardship Pharmacist Progress Note   PCP: [name], MD PCP-Cardiologist: Dorris Carnes, MD    HPI:  80 yo F with PMH of CAD, CHF, DVT/PE, GI bleeding, afib, and COPD. She was admitted on 02/04/21 with shortness of breath and edema. Her last ECHO was done on 10/09/20 and LVEF was 60-65% (was 55-60% in October 2020).  She reports she has not been taking her lasix, however her last lasix prescription was dispensed on 01/26/21 from [facility] - called and confirmed this was picked up on 01/26/21. Her grandaughter fills her prescriptions for her in her pill boxes. She confirmed that the last dose of lasix was taken on 2/14, however, there was a note that the spironolactone was removed from the pill containers due to "being stopped by the hospital doctor in October or November." Looking back in her records, this was discontinued on her discharge date 10/13/20, but then was reordered on 12/17/20 by Dr. Harrington Challenger.  Current HF Medications: Furosemide 40 mg IV BID  Prior to admission HF Medications: Furosemide 80 mg daily on Tues/Thurs, 40 mg daily all other days Metolazone 2.5 mg daily on Mon/Sat Lisinopril 2.5 mg daily  Pertinent Lab Values: . Serum creatinine 1.40, BUN 33, Potassium 4.5, Sodium 138, BNP 345.4  Vital Signs: . Weight: 167 lbs (admission weight: 166 lbs) . Blood pressure: 100-130/70s  . Heart rate: 60-80s   Medication Assistance / Insurance Benefits Check: Does the patient have prescription insurance?  Yes Type of insurance plan: [employer] Medicare  Does the patient qualify for medication assistance through manufacturers or grants?   Pending . Eligible grants and/or patient assistance programs: pending . Medication assistance applications in progress: none  . Medication assistance applications approved: none Approved medication assistance renewals will be completed by: [place]:  Prior to admission outpatient pharmacy: [place] Is the patient willing to use [facility] at discharge? Pending Is the patient willing to transition their outpatient pharmacy to utilize a [facility] outpatient pharmacy?   No - receives pill packing with this pharmacy    Assessment: 1. Acute on chronic diastolic CHF (EF 01-60%). NYHA class II symptoms. - Continue furosemide 40 mg IV BID - Lisinopril and spironolactone on hold with ongoing diuresis and decline in renal function. SCr slightly improved from 1.52>1.40 today. - Consider starting Jardiance 10 mg daily prior to discharge pending SCr trends   Plan: 1) Medication changes recommended at this time: - Continue current regimen  2) Patient assistance: - Jardiance copay $45/month - can enroll in patient assistance if needed  3)  Education  - To be completed prior to discharge  Kerby Nora, PharmD, BCPS Heart Failure Stewardship Pharmacist Phone [phone]

## 2021-02-06 NOTE — Plan of Care (Signed)
  Problem: Education: Goal: Knowledge of General Education information will improve Description Including pain rating scale, medication(s)/side effects and non-pharmacologic comfort measures Outcome: Progressing   

## 2021-02-07 ENCOUNTER — Other Ambulatory Visit: Payer: Self-pay | Admitting: Physician Assistant

## 2021-02-07 DIAGNOSIS — I5032 Chronic diastolic (congestive) heart failure: Secondary | ICD-10-CM

## 2021-02-07 DIAGNOSIS — I5033 Acute on chronic diastolic (congestive) heart failure: Secondary | ICD-10-CM | POA: Diagnosis not present

## 2021-02-07 DIAGNOSIS — I25810 Atherosclerosis of coronary artery bypass graft(s) without angina pectoris: Secondary | ICD-10-CM

## 2021-02-07 LAB — BASIC METABOLIC PANEL
Anion gap: 9 (ref 5–15)
BUN: 34 mg/dL — ABNORMAL HIGH (ref 8–23)
CO2: 32 mmol/L (ref 22–32)
Calcium: 9.2 mg/dL (ref 8.9–10.3)
Chloride: 98 mmol/L (ref 98–111)
Creatinine, Ser: 1.53 mg/dL — ABNORMAL HIGH (ref 0.44–1.00)
GFR, Estimated: 34 mL/min — ABNORMAL LOW (ref >60)
Glucose, Bld: 107 mg/dL — ABNORMAL HIGH (ref 70–99)
Potassium: 4 mmol/L (ref 3.5–5.1)
Sodium: 139 mmol/L (ref 135–145)

## 2021-02-07 MED ORDER — FUROSEMIDE 40 MG PO TABS
40.0000 mg | ORAL_TABLET | Freq: Every day | ORAL | Status: DC
  Administered 2021-02-07: 40 mg via ORAL
  Filled 2021-02-07: qty 1

## 2021-02-07 MED ORDER — FUROSEMIDE 40 MG PO TABS: 40.0000 mg | ORAL_TABLET | Freq: Every day | ORAL | 2 refills | Status: DC

## 2021-02-07 NOTE — TOC Transition Note (Addendum)
Transition of Care [facility]) - CM/SW Discharge Note   Patient Details  Name: [name] MRN: [id number] Date of Birth: 27-Aug-1941  Transition of Care [facility]) CM/SW Contact:  Zenon Mayo, RN Phone Number: 02/07/2021, 1:45 PM   Clinical Narrative:    NCM offered choice, she states she had [facility] in the past and would like them again. NCM made referral to [facility], he take referral for [facility].  Soc will begin 24 to 48 hrs post dc. Patient is on oxygen at home 4 liters with [place] [facility].  Daughter did not bring oxygen tank with her to take home.  DAughter is going by [employer] to pick up oxygen tank to take patient home with. She lives alone, she has a pill box and a scale, she weighs herself daily, she is on a no salt diet.     Final next level of care: Home w Home Health Services Barriers to Discharge:  (daughter went to get the oxygen tank)   Patient Goals and CMS Choice Patient states their goals for this hospitalization and ongoing recovery are:: get better CMS Medicare.gov Compare Post Acute Care list provided to:: Patient Choice offered to / list presented to : Patient  Discharge Placement                       Discharge Plan and Services                  DME Agency: NA       HH Arranged: RN [facility] Agency: [place] Date [facility] Agency Contacted: 02/07/21 Time [place]: 6314 Representative spoke with at [place]: [place] ([place]) Interventions     Readmission Risk Interventions No flowsheet data found.

## 2021-02-07 NOTE — Progress Notes (Signed)
bm 

## 2021-02-07 NOTE — Progress Notes (Signed)
  Mobility Specialist Criteria Algorithm Info.   Mobility Team: HOB elevated: Activity: Ambulated in hall; Dangled on edge of bed Range of motion: Active; All extremities Level of assistance: Standby assist, set-up cues, supervision of patient - no hands on Assistive device: Front wheel walker Minutes sitting in chair:  Minutes stood: 5 minutes Minutes ambulated: 5 minutes Distance ambulated (ft): 360 ft Mobility response: Tolerated well Bed Position: Semi-fowlers  Patient ambulated in hallway 360 feet at supervision with slow steady gait and RW. Required cues to stand closer to RW. Needed standing rest break x3. Denied dizziness and lightheadedness but was SOB. Tolerated ambulation well without incident or complaint and is now dangling EOB with all needs met.   02/07/2021 11:28 AM

## 2021-02-07 NOTE — Discharge Summary (Signed)
Discharge Summary    Patient ID: Braelee Herrle MRN: [id number]; DOB: [birth year]  Admit date: 02/04/2021 Discharge date: 02/07/2021  PCP:  [name], [facility]  Cardiologist:  Dorris Carnes, MD  Advanced Practice Provider:  No care team member to display Electrophysiologist:  None  [phone]    Discharge Diagnoses    Principal Problem:   Acute on chronic diastolic heart failure ([place]) Active Problems:   Hypothyroidism   COPD (chronic obstructive pulmonary disease) ([place])   Sleep apnea   Coronary artery disease involving native coronary artery of native heart with angina pectoris ([place])   Dyslipidemia   PAD (peripheral artery disease) ([place])   CHF (congestive heart failure), NYHA class III, acute, diastolic (HCC)   CAD (coronary artery disease) of bypass graft    Diagnostic Studies/Procedures    VQ Scan 02/05/2021 IMPRESSION: No convincing evidence of acute pulmonary embolism. Upper lobe defects are favored related to COPD. Findings similar to comparison exam. _____________   History of Present Illness     Miley Ayame Rena is a 80 y.o. female with CAD, diastolic CHF, CV dz, DVT/PE, GI bleeding, PAF, COPD who is being admitted due to increased SOB. Ms. Ilyas is a 80 y.o.femalewith:  CAD (s/p CABG 2004; DES to LM in 2010; myovue in 2018:  Low risk with distal anterior and apical ischemia), chronic diastolic CHF, CV dz, PAD recurrent DVT/PE, GI bleeding on anticoagulation  COPD, PAF (after CABG), GERD, HL, HTN, renal insufficiency, sleep apnea (not on CPAP)      The pt was admitted to [facility] with sepsis/pneumonia. In October 2021  She came back to the ED in November 2021 with hypotension, weakness   Renal funciton was down  Cr 2.2   Diuretics held initially  She wasthe  diuresed and then eventually sent home on oral lasix 40 bid.    The pt was seen by Tonny Branch a few days after d/c   Was feeling good at that visit   Said her  breathing was OK    On review of records, she continued to follow with [facility] after this visti  Was on lasix    At some point it appears that it may have been stopped  She comes in today with daughter   Says her breathing has gotten worse over the past month  Again she says she has not been taking lasix (I cannot see how stopped)   Ankles develped increased swelling SHe notes some wheezing.  Tired a lot   Today she notes some chest tightness.     She denies fevers or chills   Has cough which is nonproductive     Hospital Course     Consultants: N/A   Patient was directly admitted to [facility] for further diuresis.  She was placed on 40 mg twice daily dosing of IV Lasix.  D-dimer was elevated, however VQ scan shows no convincing evidence of acute PE.  During this hospitalization, her weight went down to 165.4 pounds by the time of discharge.  She is breathing better and has no problem ambulating in the hallway.    She was seen in the morning of 02/07/2019 at which time she was cleared for discharge from cardiology perspective.  Due to rising creatinine, home spironolactone was not resumed.  She will be discharged on 40 mg daily of Lasix and will require a 1 week basic metabolic panel.  Discontinue medication also include metolazone  and low-dose lisinopril.  Metolazone can be switched to as needed as outpatient depending on her symptom.  Did the patient have an acute coronary syndrome (MI, NSTEMI, STEMI, etc) this admission?:  No                               Did the patient have a percutaneous coronary intervention (stent / angioplasty)?:  No.       _____________  Discharge Vitals Blood pressure 124/67, pulse 84, temperature 98.5 F (36.9 C), temperature source Oral, resp. rate 17, height 5\' 8"  (1.727 m), weight 75 kg, SpO2 91 %.  Filed Weights   02/05/21 0443 02/06/21 0514 02/07/21 0326  Weight: 74.4 kg 76.1 kg 75 kg    Labs & Radiologic Studies    CBC Recent Labs     02/04/21 1314 02/05/21 2031  WBC 8.1 9.6  HGB 10.0* 9.3*  HCT 32.7* 31.0*  MCV 87.9 89.1  PLT 188 287   Basic Metabolic Panel Recent Labs    02/06/21 0316 02/07/21 0208  NA 138 139  K 4.5 4.0  CL 100 98  CO2 30 32  GLUCOSE 97 107*  BUN 33* 34*  CREATININE 1.40* 1.53*  CALCIUM 8.9 9.2   Liver Function Tests Recent Labs    02/04/21 1358  AST 17  ALT 12  ALKPHOS 61  BILITOT 0.5  PROT 6.8  ALBUMIN 3.4*   No results for input(s): LIPASE, AMYLASE in the last 72 hours. High Sensitivity Troponin:   No results for input(s): TROPONINIHS in the last 720 hours.  BNP Invalid input(s): POCBNP D-Dimer Recent Labs    02/04/21 1358  DDIMER 1.64*   Hemoglobin A1C No results for input(s): HGBA1C in the last 72 hours. Fasting Lipid Panel No results for input(s): CHOL, HDL, LDLCALC, TRIG, CHOLHDL, LDLDIRECT in the last 72 hours. Thyroid Function Tests No results for input(s): TSH, T4TOTAL, T3FREE, THYROIDAB in the last 72 hours.  Invalid input(s): FREET3 _____________  DG Chest 1 View  Result Date: 02/05/2021 CLINICAL DATA:  Heart failure with preserved ejection fraction, correlation with V/Q scan, shortness of breath, LEFT central chest pain for 1 month, history coronary artery disease post stenting and CABG EXAM: CHEST  1 VIEW COMPARISON:  Portable exam 0844 hours compared to 10/28/2020 FINDINGS: Normal heart size post CABG. Mediastinal contours and pulmonary vascularity normal. Atherosclerotic calcification aorta. Lungs clear. No infiltrate, pleural effusion, or pneumothorax. Osseous demineralization with prior cervical fusion and RIGHT shoulder arthroplasty. Capsular calcification at BILATERAL breast prostheses. Chronic LEFT rotator cuff tear. IMPRESSION: Post CABG. No acute abnormalities. Aortic Atherosclerosis (ICD10-I70.0). Electronically Signed   By: Lavonia Dana M.D.   On: 02/05/2021 09:14   NM Pulmonary Perfusion  Result Date: 02/05/2021 CLINICAL DATA:  Concern for  pulmonary embolism.  Elevated D-dimer. EXAM: NUCLEAR MEDICINE PERFUSION LUNG SCAN TECHNIQUE: Perfusion images were obtained in multiple projections after intravenous injection of radiopharmaceutical. RADIOPHARMACEUTICALS:  4.4 mCi Tc-63m MAA COMPARISON:  Chest x-ray 02/05/2021, V/Q scan 10/11/2020 FINDINGS: No large wedge-shaped peripheral perfusion defects to suggest acute pulmonary embolism. Several small defects noted in the posterior upper lobes on the LPO projection are favored relate to COPD. Findings are similar to comparison perfusion scan of 10/11/2020. IMPRESSION: No convincing evidence of acute pulmonary embolism. Upper lobe defects are favored related to COPD. Findings similar to comparison exam. Electronically Signed   By: Suzy Bouchard M.D.   On: 02/05/2021 14:00   Disposition  Pt is being discharged home today in good condition.  Follow-up Plans & Appointments     Follow-up Information    [name], MD. Go on 02/15/2021.   Specialty: Internal Medicine Why: @2 :15pm Contact information: [address] [place] [address] [phone]        Fay Records, MD Follow up on 02/21/2021.   Specialty: Cardiology Why: @1 :40PM. Cardiology follow up Contact information: [place] [address] [phone]        [facility] Follow up on 02/14/2021.   Specialty: Cardiology Why: Obtain BMET lab at office at any time between 7:30AM until [place] information: [address] [place] [phone]               Discharge Medications   Allergies as of 02/07/2021      Reactions   Ativan [lorazepam] Other (See Comments)   Confusion    Pitavastatin Itching   Reaction to Livalo   Ropinirole Nausea Only, Other (See Comments)   Makes legs jump, also   Zofran [ondansetron Hcl] Nausea Only   Zolpidem Tartrate Anxiety, Other (See Comments)   CONFUSION    Penicillins  Rash, Other (See Comments)   Tolerated cephalosporins in past Has patient had a PCN reaction causing immediate rash, facial/tongue/throat swelling, SOB or lightheadedness with hypotension: Yes Has patient had a PCN reaction causing severe rash involving mucus membranes or skin necrosis: No Has patient had a PCN reaction that required hospitalization: No Has patient had a PCN reaction occurring within the last 10 years: No If all of the above answers are "NO", then may proceed with Cephalosporin use.      Medication List    STOP taking these medications   lisinopril 2.5 MG tablet Commonly known as: ZESTRIL   metolazone 2.5 MG tablet Commonly known as: ZAROXOLYN   spironolactone 25 MG tablet Commonly known as: ALDACTONE     TAKE these medications   allopurinol 100 MG tablet Commonly known as: ZYLOPRIM Take 100 mg by mouth daily with breakfast.   ARIPiprazole 2 MG tablet Commonly known as: ABILIFY Take 1 mg by mouth daily with breakfast.   aspirin EC 81 MG tablet Take 81 mg by mouth daily with breakfast. Swallow whole.   atorvastatin 20 MG tablet Commonly known as: LIPITOR Take 20 mg by mouth daily with supper.   BC HEADACHE POWDER PO Take 1 packet by mouth 2 (two) times daily as needed (severe headache).   cholecalciferol 25 MCG (1000 UNIT) tablet Commonly known as: VITAMIN D3 Take 1,000 Units by mouth daily with supper.   citalopram 40 MG tablet Commonly known as: CELEXA Take 40 mg by mouth daily with supper.   Combivent Respimat 20-100 MCG/ACT Aers respimat Generic drug: Ipratropium-Albuterol Inhale 1 puff into the lungs 3 (three) times daily as needed for wheezing or shortness of breath.   cyclobenzaprine 10 MG tablet Commonly known as: FLEXERIL Take 10 mg by mouth 2 (two) times daily.   ferrous sulfate 325 (65 FE) MG tablet Commonly known as: FerrouSul Take 1 tablet (325 mg total) by mouth 3 (three) times daily with meals for 14 days. What changed: when  to take this   furosemide 40 MG tablet Commonly known as: LASIX Take 1 tablet (40 mg total) by mouth daily. Start taking on: February 08, 2021 What changed:   when to take this  additional instructions   gabapentin  100 MG capsule Commonly known as: NEURONTIN Take 100 mg by mouth daily with breakfast.   levothyroxine 50 MCG tablet Commonly known as: SYNTHROID Take 1 tablet (50 mcg total) by mouth daily at 6 (six) AM. What changed: when to take this   magnesium oxide 400 (241.3 Mg) MG tablet Commonly known as: MAG-OX Take 1 tablet (400 mg total) by mouth 2 (two) times daily. What changed: when to take this   Melatonin 10 MG Tabs Take 30 mg by mouth at bedtime.   nitroGLYCERIN 0.4 MG SL tablet Commonly known as: NITROSTAT Place 1 tablet (0.4 mg total) under the tongue every 5 (five) minutes as needed for chest pain. For chest pain   omeprazole 40 MG capsule Commonly known as: PRILOSEC TAKE 1 CAPSULE BY MOUTH 2 TIMES DAILY. What changed: See the new instructions.   OXYGEN Inhale 4 L/min into the lungs as needed (DURING ALL TIMES OF EXERTION).   pilocarpine 1 % ophthalmic solution Commonly known as: PILOCAR Place 1 drop into both eyes 2 (two) times daily.   potassium chloride SA 20 MEQ tablet Commonly known as: KLOR-CON Take 20 mEq by mouth daily with lunch.   pramipexole 0.25 MG tablet Commonly known as: MIRAPEX Take 0.25 mg by mouth daily with supper.   roflumilast 500 MCG Tabs tablet Commonly known as: DALIRESP Take 500 mcg by mouth daily with breakfast.   traZODone 50 MG tablet Commonly known as: DESYREL Take 150 mg by mouth at bedtime.   vitamin C 500 MG tablet Commonly known as: ASCORBIC ACID Take 500 mg by mouth daily with lunch.   Xtampza ER 9 MG C12a Generic drug: oxyCODONE ER Take 9 mg by mouth 2 (two) times daily.          Outstanding Labs/Studies   N/A  Duration of Discharge Encounter   Greater than 30 minutes including physician  time.  Hilbert Corrigan, PA 02/07/2021, 12:25 PM

## 2021-02-07 NOTE — Plan of Care (Signed)
  Problem: Education: Goal: Knowledge of General Education information will improve Description: Including pain rating scale, medication(s)/side effects and non-pharmacologic comfort measures Outcome: Adequate for Discharge   

## 2021-02-07 NOTE — Consult Note (Signed)
   [facility] Inpatient Consult   02/07/2021  [name] 11-Aug-1941 [id number]   [place]  Accountable Care Organization [ACO] Patient: [facility] Medicare  Patient is currently active with [place] Management for chronic disease management services for Chronic Heart Failure.  Patient has been engaged by a [facility] Telephonic RN Care Management Coordinator.  Our community based plan of care has focused on disease management and community resource support.    Plan:  Will alert and follow up with [facility] RN CM as well as with  Inpatient Transition Of Care [TOC] team member to make aware that [place] Management following. Continue to follow for progress and disposition needs. 1400: Reviewed for disposition and patient to return home with [facility], [facility] and also with follow up with the Advanced HF team noted.  Will alert [facility] RN CM, call patient's room for reminder, currently no answer.   Of note, [facility] Care Management services does not replace or interfere with any services that are needed or arranged by inpatient [facility] care management team.  For additional questions or referrals please contact:  Natividad Brood, RN BSN [facility] Liaison  [phone] business mobile phone Toll free office [phone]  Fax number: [phone] [email or web address]

## 2021-02-07 NOTE — Progress Notes (Addendum)
Progress Note  Patient Name: [name] Date of Encounter: 02/07/2021  [facility] Cardiologist: Dorris Carnes, MD   Subjective   Denies any CP or SOB.   Inpatient Medications    Scheduled Meds: . allopurinol  100 mg Oral Daily  . ARIPiprazole  1 mg Oral Daily  . aspirin EC  81 mg Oral Daily  . atorvastatin  20 mg Oral Daily  . citalopram  40 mg Oral QHS  . ferrous sulfate  325 mg Oral TID WC  . furosemide  40 mg Oral Daily  . gabapentin  100 mg Oral Daily  . heparin injection (subcutaneous)  5,000 Units Subcutaneous Q8H  . levothyroxine  50 mcg Oral Q0600  . magnesium oxide  400 mg Oral BID  . oxyCODONE  10 mg Oral Q8H  . pantoprazole  40 mg Oral Daily  . pilocarpine  1 drop Both Eyes BID  . pramipexole  0.25 mg Oral QPM  . roflumilast  500 mcg Oral QPM  . sodium chloride flush  3 mL Intravenous Q12H  . traZODone  150 mg Oral QHS   Continuous Infusions: . sodium chloride     PRN Meds: sodium chloride, acetaminophen, levalbuterol, nitroGLYCERIN, ondansetron (ZOFRAN) IV, sodium chloride flush   Vital Signs    Vitals:   02/05/21 2204 02/06/21 0514 02/06/21 2039 02/07/21 0326  BP:  134/79 (!) 111/59 124/67  Pulse: 87 87 73 84  Resp:  17 17 17   Temp: 98.1 F (36.7 C) 97.9 F (36.6 C) 98.3 F (36.8 C) 98.5 F (36.9 C)  TempSrc: Oral Oral Oral Oral  SpO2: 97%  92% 91%  Weight:  76.1 kg  75 kg  Height:        Intake/Output Summary (Last 24 hours) at 02/07/2021 0949 Last data filed at 02/07/2021 0600 Gross per 24 hour  Intake 340 ml  Output 900 ml  Net -560 ml   Last 3 Weights 02/07/2021 02/06/2021 02/05/2021  Weight (lbs) 165 lb 6.4 oz 167 lb 11.2 oz 164 lb  Weight (kg) 75.025 kg 76.068 kg 74.39 kg      Telemetry    NSR with occasional PACs - Personally Reviewed  ECG    N/A - Personally Reviewed  Physical Exam   GEN: No acute distress.   Neck: No JVD Cardiac: RRR, no murmurs, rubs, or gallops.  Respiratory: Clear to auscultation  bilaterally. GI: Soft, nontender, non-distended  MS: No edema; No deformity. Neuro:  Nonfocal  Psych: Normal affect   Labs    High Sensitivity Troponin:  No results for input(s): TROPONINIHS in the last 720 hours.    Chemistry Recent Labs  Lab 02/04/21 1358 02/05/21 0650 02/05/21 2031 02/06/21 0316 02/07/21 0208  NA 140 141  --  138 139  K 3.5 4.2  --  4.5 4.0  CL 100 99  --  100 98  CO2 31 29  --  30 32  GLUCOSE 87 200*  --  97 107*  BUN 26* 25*  --  33* 34*  CREATININE 1.27* 1.52* 1.43* 1.40* 1.53*  CALCIUM 9.2 8.9  --  8.9 9.2  PROT 6.8  --   --   --   --   ALBUMIN 3.4*  --   --   --   --   AST 17  --   --   --   --   ALT 12  --   --   --   --   ALKPHOS 61  --   --   --   --  BILITOT 0.5  --   --   --   --   GFRNONAA 43* 34* 37* 38* 34*  ANIONGAP 9 13  --  8 9     Hematology Recent Labs  Lab 02/04/21 1314 02/05/21 2031  WBC 8.1 9.6  RBC 3.72* 3.48*  HGB 10.0* 9.3*  HCT 32.7* 31.0*  MCV 87.9 89.1  MCH 26.9 26.7  MCHC 30.6 30.0  RDW 15.5 15.4  PLT 188 178    BNP Recent Labs  Lab 02/04/21 1358  BNP 345.4*     DDimer  Recent Labs  Lab 02/04/21 1358  DDIMER 1.64*     Radiology    NM Pulmonary Perfusion  Result Date: 02/05/2021 CLINICAL DATA:  Concern for pulmonary embolism.  Elevated D-dimer. EXAM: NUCLEAR MEDICINE PERFUSION LUNG SCAN TECHNIQUE: Perfusion images were obtained in multiple projections after intravenous injection of radiopharmaceutical. RADIOPHARMACEUTICALS:  4.4 mCi Tc-52m MAA COMPARISON:  Chest x-ray 02/05/2021, V/Q scan 10/11/2020 FINDINGS: No large wedge-shaped peripheral perfusion defects to suggest acute pulmonary embolism. Several small defects noted in the posterior upper lobes on the LPO projection are favored relate to COPD. Findings are similar to comparison perfusion scan of 10/11/2020. IMPRESSION: No convincing evidence of acute pulmonary embolism. Upper lobe defects are favored related to COPD. Findings similar to  comparison exam. Electronically Signed   By: Suzy Bouchard M.D.   On: 02/05/2021 14:00    Cardiac Studies   ECHO:10/09/2020 1. Diastology suggestive of impaired LV filling. Left ventricular  ejection fraction, by estimation, is 60 to 65%. The left ventricle has  normal function. The left ventricle has no regional wall motion  abnormalities. There is moderate concentric left  ventricular hypertrophy. Left ventricular diastolic parameters are  indeterminate.  2. Right ventricular systolic function is low normal. The right  ventricular size is normal. There is normal pulmonary artery systolic  pressure.  3. Left atrial size was moderately dilated.  4. Right atrial size was moderately dilated.  5. The mitral valve is myxomatous. No evidence of mitral valve  regurgitation. The mean mitral valve gradient is 5.0 mmHg with average  heart rate of 87 bpm. Moderate mitral annular calcification.  6. The aortic valve is tricuspid. Aortic valve regurgitation is not  visualized.  7. The inferior vena cava is dilated in size with <50% respiratory  variability, suggesting right atrial pressure of 15 mmHg.    Patient Profile     80 y.o. female with PMH of CAD (s/p CABG 2004, DES to LM 2010, myoview 2018 low risk with distal anterior and apical ischemia), chronic diastolic CHF, PAD, recurrent DVT/PE, GI bleed on anticoagulation, COPD, PAF, HTN, HLD, renal insufficiency, OSA not on CPAP who was admitted on 2/14 with SOB  Assessment & Plan    1. Acute on chronic diastolic CHF  - VQ scan negative for PE  - euvolemic on exam, converted to 40mg  PO lasix. Will need 1 week BMET after discharge.   2. CKD stage III: Cr 1.5, repeat BMET in 1 week  3. CAD s/p CABG: denies any CP  4. COPD  5. HTN  6. HLD  7. H/o reccurent PE/DVT   For questions or updates, please contact [place] Please consult [email or web address] for contact info under        Signed, Almyra Deforest, [place]  02/07/2021,  9:49 AM    Patient seen and examined with Almyra Deforest PA.  Agree as above, with the following exceptions and changes as noted below. She is sitting  comfortably on side of bed without dyspnea. Ambulated well this morning with nurse. Gen: NAD, CV: RRR, no murmurs, Lungs: clear, Abd: soft, Extrem: Warm, well perfused, no edema, Neuro/Psych: alert and oriented x 3, normal mood and affect. All available labs, radiology testing, previous records reviewed. Will do lasix 40 mg po daily to ensure adequate diuresis but no overdiuresis until follow up. BMET in 7 days. Hold spironolactone and lisinopril.  Elouise Munroe, MD 02/07/21 10:26 AM

## 2021-02-07 NOTE — Progress Notes (Signed)
Heart Failure Nurse Navigator Progress Note Brief note regarding HV TOC appt scheduled for Tuesday 2/22 @ 1pm. Concern about medication adherence, pt has granddaughter, Lilia Pro, set up weekly medications. Pt stated she hasn't been taking medications, but appears she actually had. Close follow up would be beneficial. Will draw BMET at this appt and cancel lab draw on 2/24 per cardiology team.   Social History   Socioeconomic History  . Marital status: Widowed    Spouse name: Not on file  . Number of children: 3  . Years of education: Not on file  . Highest education level: Not on file  Occupational History  . Occupation: retired    Comment: Electronics engineer: RETIRED  Tobacco Use  . Smoking status: Former Smoker    Packs/day: 1.00    Years: 45.00    Pack years: 45.00    Types: Cigarettes    Quit date: 12/22/2000    Years since quitting: 20.1  . Smokeless tobacco: Never Used  Vaping Use  . Vaping Use: Never used  Substance and Sexual Activity  . Alcohol use: Never    Alcohol/week: 0.0 standard drinks  . Drug use: Never  . Sexual activity: Not Currently  Other Topics Concern  . Not on file  Social History Narrative      3 grown children.   Social Determinants of Health   Financial Resource Strain: Not on file  Food Insecurity: No Food Insecurity  . Worried About Charity fundraiser in the Last Year: Never true  . Ran Out of Food in the Last Year: Never true  Transportation Needs: No Transportation Needs  . Lack of Transportation (Medical): No  . Lack of Transportation (Non-Medical): No  Physical Activity: Not on file  Stress: Not on file  Social Connections: Not on file    High Risk Criteria for Readmission and/or Poor Patient Outcomes:  Heart failure hospital admissions (last 6 months): 1   No Show rate: 4%  Difficult social situation: no  Demonstrates medication adherence: questionable.   Primary Language: English  Literacy level: able to read/write.  Comprehension and memory concern for safe self-care.   Barriers of Care:   -memory impairment ?  -medication administration/regimen  Considerations/Referrals:   Referral made to Heart Failure Pharmacist Stewardship: yes, will also see at [facility] [place] [facility]. Referral made to Heart & Vascular TOC clinic: yes, Appt made Tuesday 2/22 @ 1pm  Items for Follow-up on DC/TOC: -medication regimen/adherence -medication set up assistance at home -safe self care ?   Pricilla Holm, RN, BSN Heart Failure Nurse Navigator [phone]

## 2021-02-07 NOTE — Progress Notes (Signed)
Heart Failure Stewardship Pharmacist Progress Note   PCP: [name], MD PCP-Cardiologist: Dorris Carnes, MD    HPI:  80 yo F with PMH of CAD, CHF, DVT/PE, GI bleeding, afib, and COPD. She was admitted on 02/04/21 with shortness of breath and edema. Her last ECHO was done on 10/09/20 and LVEF was 60-65% (was 55-60% in October 2020).  She reports she has not been taking her lasix, however her last lasix prescription was dispensed on 01/26/21 from [facility] - called and confirmed this was picked up on 01/26/21. Her grandaughter fills her prescriptions for her in her pill boxes. She confirmed that the last dose of lasix was taken on 2/14, however, there was a note that the spironolactone was removed from the pill containers due to "being stopped by the hospital doctor in October or November." Looking back in her records, this was discontinued on her discharge date 10/13/20, but then was reordered on 12/17/20 by Dr. Harrington Challenger.  Current HF Medications: Furosemide 40 mg PO daily  Prior to admission HF Medications: Furosemide 80 mg daily on Tues/Thurs, 40 mg daily all other days Metolazone 2.5 mg daily on Mon/Sat Lisinopril 2.5 mg daily  Pertinent Lab Values: . Serum creatinine 1.53, BUN 34, Potassium 4.0, Sodium 139, BNP 345.4  Vital Signs: . Weight: 165 lbs (admission weight: 166 lbs) . Blood pressure: [id number] s  . Heart rate: 60-80s   Medication Assistance / Insurance Benefits Check: Does the patient have prescription insurance?  Yes Type of insurance plan: Hum[employer] Medicare  Does the patient qualify for medication assistance through manufacturers or grants?   Pending . Eligible grants and/or patient assistance programs: pending . Medication assistance applications in progress: none  . Medication assistance applications approved: none Approved medication assistance renewals will be completed by: [place]:  Prior to admission outpatient pharmacy: [facility] Is the patient willing to use [facility] at discharge? Pending Is the patient willing to transition their outpatient pharmacy to utilize a [facility] outpatient pharmacy?   No - receives pill packing with this pharmacy    Assessment: 1. Acute on chronic diastolic CHF (EF 46-50%). NYHA class II symptoms. - Agree with transitioning to furosemide 40 mg PO daily - Lisinopril and spironolactone on hold for decline in renal function. SCr bumped from 1.40 to 1.53 today - Consider starting Jardiance 10 mg daily prior to discharge pending SCr trends   Plan: 1) Medication changes recommended at this time: - Continue current regimen  2) Patient assistance: - Jardiance copay $45/month - can enroll in patient assistance if needed  3)  Education  - To be completed prior to discharge  Kerby Nora, PharmD, BCPS Heart Failure Stewardship Pharmacist Phone [phone]

## 2021-02-07 NOTE — Progress Notes (Signed)
D/C instructions given and reviewed. Questions asked and answered but encouraged to call with any further concerns. Pt already dressed in street clothes and stated IV and tele had been removed. Waiting on daughter to return with O2 tank.

## 2021-02-08 ENCOUNTER — Other Ambulatory Visit: Payer: Self-pay | Admitting: *Deleted

## 2021-02-08 ENCOUNTER — Ambulatory Visit: Payer: Self-pay

## 2021-02-08 NOTE — Patient Outreach (Signed)
  [name]) Care Management  [place]  02/08/2021   [name] 1941-05-02 [id number]  Transition of care call    Referral received : 02/07/21 Referral source: [facility] Liaison  Date of Admission ;02/04/21 Diagnosis :Acute on Chronic heart failure  Facility: [facility] Date of Discharge:02/07/21 Insurance: Humana     Subjective:  Unsuccessful outreach attempt to patient, no answer able to leave a HIPAA compliant voice mail message for return call . Placed call to patient  son , [name],   No answer able to leave a HIPAA compliant voice mail message for return call.    Plan:  Follow-up:  Care Manager will plan return call in the next 4 business days.  Will send unsuccessful outreach letter.   Joylene Draft, RN, BSN  [place] Management Coordinator  [phone]- Mobile [phone]- Toll Free Main Office

## 2021-02-10 DIAGNOSIS — I2699 Other pulmonary embolism without acute cor pulmonale: Secondary | ICD-10-CM | POA: Diagnosis not present

## 2021-02-10 DIAGNOSIS — I25810 Atherosclerosis of coronary artery bypass graft(s) without angina pectoris: Secondary | ICD-10-CM | POA: Diagnosis not present

## 2021-02-10 DIAGNOSIS — J439 Emphysema, unspecified: Secondary | ICD-10-CM | POA: Diagnosis not present

## 2021-02-10 DIAGNOSIS — I82403 Acute embolism and thrombosis of unspecified deep veins of lower extremity, bilateral: Secondary | ICD-10-CM | POA: Diagnosis not present

## 2021-02-10 DIAGNOSIS — I5033 Acute on chronic diastolic (congestive) heart failure: Secondary | ICD-10-CM | POA: Diagnosis not present

## 2021-02-10 DIAGNOSIS — I110 Hypertensive heart disease with heart failure: Secondary | ICD-10-CM | POA: Diagnosis not present

## 2021-02-10 DIAGNOSIS — I480 Paroxysmal atrial fibrillation: Secondary | ICD-10-CM | POA: Diagnosis not present

## 2021-02-10 DIAGNOSIS — I25118 Atherosclerotic heart disease of native coronary artery with other forms of angina pectoris: Secondary | ICD-10-CM | POA: Diagnosis not present

## 2021-02-10 DIAGNOSIS — M545 Low back pain, unspecified: Secondary | ICD-10-CM | POA: Diagnosis not present

## 2021-02-12 ENCOUNTER — Encounter (HOSPITAL_COMMUNITY): Payer: Self-pay

## 2021-02-12 ENCOUNTER — Ambulatory Visit (HOSPITAL_COMMUNITY)
Admit: 2021-02-12 | Discharge: 2021-02-12 | Disposition: A | Discharge status: Home / Self Care | Payer: Medicare HMO | Attending: Internal Medicine | Admitting: Internal Medicine

## 2021-02-12 ENCOUNTER — Other Ambulatory Visit: Payer: Self-pay

## 2021-02-12 VITALS — BP 130/66 | HR 90 | Wt 167.0 lb

## 2021-02-12 DIAGNOSIS — J449 Chronic obstructive pulmonary disease, unspecified: Secondary | ICD-10-CM | POA: Diagnosis not present

## 2021-02-12 DIAGNOSIS — I130 Hypertensive heart and chronic kidney disease with heart failure and stage 1 through stage 4 chronic kidney disease, or unspecified chronic kidney disease: Secondary | ICD-10-CM | POA: Insufficient documentation

## 2021-02-12 DIAGNOSIS — N1831 Chronic kidney disease, stage 3a: Secondary | ICD-10-CM | POA: Insufficient documentation

## 2021-02-12 DIAGNOSIS — I4891 Unspecified atrial fibrillation: Secondary | ICD-10-CM | POA: Diagnosis not present

## 2021-02-12 DIAGNOSIS — Z8249 Family history of ischemic heart disease and other diseases of the circulatory system: Secondary | ICD-10-CM | POA: Diagnosis not present

## 2021-02-12 DIAGNOSIS — Z951 Presence of aortocoronary bypass graft: Secondary | ICD-10-CM | POA: Diagnosis not present

## 2021-02-12 DIAGNOSIS — Z6833 Body mass index (BMI) 33.0-33.9, adult: Secondary | ICD-10-CM | POA: Diagnosis not present

## 2021-02-12 DIAGNOSIS — E669 Obesity, unspecified: Secondary | ICD-10-CM | POA: Insufficient documentation

## 2021-02-12 DIAGNOSIS — Z955 Presence of coronary angioplasty implant and graft: Secondary | ICD-10-CM | POA: Insufficient documentation

## 2021-02-12 DIAGNOSIS — I2510 Atherosclerotic heart disease of native coronary artery without angina pectoris: Secondary | ICD-10-CM | POA: Insufficient documentation

## 2021-02-12 DIAGNOSIS — E785 Hyperlipidemia, unspecified: Secondary | ICD-10-CM | POA: Insufficient documentation

## 2021-02-12 DIAGNOSIS — Z86718 Personal history of other venous thrombosis and embolism: Secondary | ICD-10-CM | POA: Diagnosis not present

## 2021-02-12 DIAGNOSIS — Z86711 Personal history of pulmonary embolism: Secondary | ICD-10-CM

## 2021-02-12 DIAGNOSIS — R0602 Shortness of breath: Secondary | ICD-10-CM | POA: Diagnosis not present

## 2021-02-12 DIAGNOSIS — Z79899 Other long term (current) drug therapy: Secondary | ICD-10-CM | POA: Insufficient documentation

## 2021-02-12 DIAGNOSIS — Z9981 Dependence on supplemental oxygen: Secondary | ICD-10-CM | POA: Insufficient documentation

## 2021-02-12 DIAGNOSIS — I872 Venous insufficiency (chronic) (peripheral): Secondary | ICD-10-CM | POA: Insufficient documentation

## 2021-02-12 DIAGNOSIS — I5032 Chronic diastolic (congestive) heart failure: Secondary | ICD-10-CM | POA: Diagnosis not present

## 2021-02-12 DIAGNOSIS — G4733 Obstructive sleep apnea (adult) (pediatric): Secondary | ICD-10-CM | POA: Insufficient documentation

## 2021-02-12 DIAGNOSIS — J439 Emphysema, unspecified: Secondary | ICD-10-CM | POA: Diagnosis not present

## 2021-02-12 DIAGNOSIS — Z87891 Personal history of nicotine dependence: Secondary | ICD-10-CM | POA: Insufficient documentation

## 2021-02-12 DIAGNOSIS — Z7982 Long term (current) use of aspirin: Secondary | ICD-10-CM | POA: Diagnosis not present

## 2021-02-12 LAB — BASIC METABOLIC PANEL
Anion gap: 9 (ref 5–15)
BUN: 25 mg/dL — ABNORMAL HIGH (ref 8–23)
CO2: 32 mmol/L (ref 22–32)
Calcium: 9.2 mg/dL (ref 8.9–10.3)
Chloride: 100 mmol/L (ref 98–111)
Creatinine, Ser: 1.53 mg/dL — ABNORMAL HIGH (ref 0.44–1.00)
GFR, Estimated: 34 mL/min — ABNORMAL LOW (ref >60)
Glucose, Bld: 94 mg/dL (ref 70–99)
Potassium: 3.8 mmol/L (ref 3.5–5.1)
Sodium: 141 mmol/L (ref 135–145)

## 2021-02-12 MED ORDER — SPIRONOLACTONE 25 MG PO TABS
12.5000 mg | ORAL_TABLET | Freq: Every day | ORAL | 0 refills | Status: DC
Start: 2021-02-12 — End: 2021-07-02

## 2021-02-12 NOTE — Patient Instructions (Signed)
Start Spironolactone 12.5 mg (1/2 tab) Daily  Labs done today, your results will be available in MyChart, we will contact you for abnormal readings.  Thank you for allowing [place] to provider your heart failure care after your recent hospitalization. Please follow-up with Dr Harrington Challenger as scheduled on 02/21/2021  If you have any questions, issues, or concerns before your next appointment please call our office at [phone], opt. 2 and leave a message for the triage nurse.

## 2021-02-12 NOTE — Progress Notes (Signed)
Heart and Vascular Center Transitions of Care Clinic  PCP: [name], [name] Primary Cardiologist: Dorris Carnes  HPI:  [name] is a 80 y.o.  female  with a PMH significant for CAD (s/p CABG 2004; DES to LM in 2010; myovue in 2018: Low risk with distal anterior and apical ischemia), diastolic CHF, DVT/PE,  PAF(after CABG), COPD, ? Cirrhosis by CT in 2019 but since then imaging not suggestive, PAD, She has history of upper GI bleeding.  EGD showed gastric erosions, AVMs.  Her anticoagulation was discontinued.   In 02/2018,she was admitted with pulmonary emboli and acute DVT in L femoral vein along with ischemic colitis. Found to have a SMA stenosis and underwent stenting by vascular surgery. Hospitalizedwith acute anemia due to abdominal hematoma on heparin. IVC filter placed.Also underwent paracentesis for ascites. CT with possible cirrhosis.   Followed by Dr. Harrington Challenger for diastolic CHF, adjustments in diuretic regimen made here and there, she felt the patient's dyspnea was multifactorial.  Does not follow with pulmonology on incruse and combivent, no nebulizer machine.  Said she did follow with a pulmonologist many years ago.  Has been on oxygen for around 10 years, currently on 4L [place].  With minimal exertion SPO2 levels at home drop into the 80's.    Very short of breath with exertion just walking from one room to another.  Some swelling remains in lower extremities mainly her feet, but improved compared with admission. She did not have a noticeable improvement in her dyspnea with diuresis. Weight has been stable since discharge.     ROS: All systems negative except as listed in HPI, PMH and Problem List.  SH:  Social History   Socioeconomic History  . Marital status: Widowed    Spouse name: Not on file  . Number of children: 3  . Years of education: Not on file  . Highest education level: Not on file  Occupational History  . Occupation: retired    Comment: Stage manager: RETIRED  Tobacco Use  . Smoking status: Former Smoker    Packs/day: 1.00    Years: 45.00    Pack years: 45.00    Types: Cigarettes    Quit date: 12/22/2000    Years since quitting: 20.1  . Smokeless tobacco: Never Used  Vaping Use  . Vaping Use: Never used  Substance and Sexual Activity  . Alcohol use: Never    Alcohol/week: 0.0 standard drinks  . Drug use: Never  . Sexual activity: Not Currently  Other Topics Concern  . Not on file  Social History Narrative      3 grown children.   Social Determinants of Health   Financial Resource Strain: Not on file  Food Insecurity: No Food Insecurity  . Worried About Charity fundraiser in the Last Year: Never true  . Ran Out of Food in the Last Year: Never true  Transportation Needs: No Transportation Needs  . Lack of Transportation (Medical): No  . Lack of Transportation (Non-Medical): No  Physical Activity: Not on file  Stress: Not on file  Social Connections: Not on file  Intimate Partner Violence: Not on file    FH:  Family History  Problem Relation Age of Onset  . Ovarian cancer Mother   . Lung cancer Father   . COPD Father   . Heart disease Father   . Heart disease Sister   . Stomach cancer Sister   . Colitis Sister  Past Medical History:  Diagnosis Date  . Anemia    bld. transfusion post lumbar surgery- 2012  . Anxiety   . Arthralgia    NOS  . Blood transfusion 2012; 02/2018   WITH BACK SURGERY; "related to hematoma in stomach" (10/06/2018)  . CAD (coronary artery disease)    s/p CABG 2004; s/p DES to LM in 2010;  [place] 10/29/11: EF 50-55%, mild elevated filling pressures, no pulmonary HTN, LM 90% ISR, LAD and CFX occluded, S-RI occluded (old), S-OM3 ok and L-LAD ok, native nondominant RCA 95% -  med rx recommended ; Lexiscan Myoview 7/13 at [facility]: demonstrated "normal LV function, anterior attenuation and localized ischemia, inferior, basilar, mid section"  . Carotid artery disease  ([place])    Carotid US 6/44:  RICA 0-34; LICA 74-25; R subclavian stenosis - Repeat 1 year. // Carotid US 05/2019: R 1-39; L 40-59; R vertebral with atypical antegrade flow; R subclavian stenosis >> repeat 1 year // Carotid US 7/21: Bilat 40-59; L subclavian stenosis   . CHF (congestive heart failure) ([place]) 01/21/2020  . Chronic diastolic heart failure (HCC)    Echo 9/10: EF 95-63%, grade 1 diastolic dysfunction  . Chronic lower back pain   . COPD (chronic obstructive pulmonary disease) ([place])    Emphysema dxed by Dr. Woody Seller in [place] based on PFTs per pt in 2006; placed on albuterol  . Depression    TAKES CELEXA  AND  (OFF- WELBUTRIN)  . Diuretic-induced hypokalemia 10/08/2018  . DVT of lower extremity (deep venous thrombosis) (HCC)    recurrent. bilateral (2 episodes)  . Dyspnea    home o2 when needed  . Dysrhythmia    afib with cabg  . Exertional angina (HCC)    Treated with Isosorbide, Ranexa, amlodipine; intolerant to metoprolol  . GERD (gastroesophageal reflux disease)   . Gout    "on daily RX" (10/06/2018)  . HLD (hyperlipidemia)   . Hypertension   . Hyperthyroidism   . L Subclavian Artery Stenosis    Carotid US 7/21   . Obesity (BMI 30-39.9) 2009   BMI 33  . Osteoarthritis    "all over; hands" (10/06/2018)  . Oxygen deficiency   . Oxygen dependent    4L at home as needed (10/06/2018)  . Pneumonia   . PVD (peripheral vascular disease) ([place])   . Sleep apnea 2012   USED CPAP THEN  STARTED USING SPIRIVA , Nov. 2013- last evaluation , changed from mask to aparatus that is just for her nose.. ; reports 05-28-18 "the mask smothers me so i dont use it right now" (10/06/2018)    Current Outpatient Medications  Medication Sig Dispense Refill  . allopurinol (ZYLOPRIM) 100 MG tablet Take 100 mg by mouth daily with breakfast.    . ARIPiprazole (ABILIFY) 2 MG tablet Take 1 mg by mouth daily with breakfast.    . aspirin EC 81 MG tablet Take 81 mg by mouth daily with breakfast. Swallow  whole.    . Aspirin-Salicylamide-Caffeine (BC HEADACHE POWDER PO) Take 1 packet by mouth 2 (two) times daily as needed (severe headache).    Marland Kitchen atorvastatin (LIPITOR) 20 MG tablet Take 20 mg by mouth daily with supper.    . cholecalciferol (VITAMIN D3) 25 MCG (1000 UNIT) tablet Take 1,000 Units by mouth daily with supper.    . citalopram (CELEXA) 40 MG tablet Take 40 mg by mouth daily with supper.    . cyclobenzaprine (FLEXERIL) 10 MG tablet Take 10 mg by mouth 2 (  two) times daily.    . ferrous sulfate 325 (65 FE) MG tablet Take 325 mg by mouth daily with breakfast.    . furosemide (LASIX) 40 MG tablet Take 1 tablet (40 mg total) by mouth daily. 30 tablet 2  . gabapentin (NEURONTIN) 100 MG capsule Take 100 mg by mouth daily with breakfast.    . Ipratropium-Albuterol (COMBIVENT RESPIMAT) 20-100 MCG/ACT AERS respimat Inhale 1 puff into the lungs 3 (three) times daily as needed for wheezing or shortness of breath.    . levothyroxine (SYNTHROID) 50 MCG tablet Take 1 tablet (50 mcg total) by mouth daily at 6 (six) AM. 90 tablet 3  . magnesium oxide (MAG-OX) 400 (241.3 Mg) MG tablet Take 1 tablet (400 mg total) by mouth 2 (two) times daily.    . Melatonin 10 MG TABS Take 30 mg by mouth at bedtime.    . nitroGLYCERIN (NITROSTAT) 0.4 MG SL tablet Place 1 tablet (0.4 mg total) under the tongue every 5 (five) minutes as needed for chest pain. For chest pain 25 tablet 3  . omeprazole (PRILOSEC) 40 MG capsule TAKE 1 CAPSULE BY MOUTH 2 TIMES DAILY. 180 capsule 2  . OXYGEN Inhale 4 L/min into the lungs as needed (DURING ALL TIMES OF EXERTION).     Marland Kitchen pilocarpine (PILOCAR) 1 % ophthalmic solution Place 1 drop into both eyes 2 (two) times daily.    . potassium chloride SA (KLOR-CON) 20 MEQ tablet Take 20 mEq by mouth daily with lunch.    . pramipexole (MIRAPEX) 0.25 MG tablet Take 0.25 mg by mouth daily with supper.    . roflumilast (DALIRESP) 500 MCG TABS tablet Take 500 mcg by mouth daily with breakfast.    .  traZODone (DESYREL) 50 MG tablet Take 150 mg by mouth at bedtime.     . vitamin C (ASCORBIC ACID) 500 MG tablet Take 500 mg by mouth daily with lunch.    Ginger Organ ER 9 MG C12A Take 9 mg by mouth 2 (two) times daily.     No current facility-administered medications for this encounter.    Vitals:   02/12/21 1309  BP: 130/66  Pulse: 90  SpO2: 100%  Weight: 75.8 kg (167 lb)    PHYSICAL EXAM: Cardiac: JVD 7, normal rate and rhythm, clear s1 and s2, no murmurs, rubs or gallops, pedal edema bilaterally Pulmonary: faint expiratory wheezing bilaterally, distant breath sounds otherwise clear, not in distress  Abdominal: non distended abdomen, soft and nontender Psych: Alert, conversant, in good spirits  ASSESSMENT & PLAN:  Chronic Diastolic CHF: -last ECHO 29/5621 EF 30-86%, Diastolic Dysfunction, low normal RV function, normal PA pressure -REDS vest reading 28% today, euvolemic on examination -Likely her dyspnea primarily stems from her lung pathology -NYHA Class III symptoms although likely primarily from lung disease -Repeat bmp today, start spironolactone 12.5mg , may be able to decrease lasix at next visit -Most of her symptoms are RH symptoms, Would start with repeat ECHO to see if any further strain noted on right side of the heart, Also has never had RHC but would start with repeat ECHO.  I don't think she has cirrhosis but if she does this could also help explain why her symptoms are primarily right sided. -only on lasix 40mg  currently, get bmp, start back spironolactone 12.5mg  daily will need repeat bmp monitoring starting next week and then monthly x3.    CAD: -(s/p CABG 2004; DES to LM in 2010; myovue in 2018: Low risk with distal anterior and  apical ischemia) -No anginal symptoms, normal EF without RWMA and gets hypoxemic with ambulation suggesting her dyspnea is not an anginal equivalent -Continue asa  COPD: -Reviewed 2018 PFT's personally she has mixed severe obstructive  and restrictive disease FEV1/FVC 67% pred FEV 1 36% pred, FVC 55% pred, TLC 78% pred, DLCO 50% pred.  She has a significant loss of lung parenchyma.   -? ILD, needs repeat PFT's, consider HRCT -her severe dyspnea is primarily from her lung disease and would recommend pulmonology referral   Venous Insufficiency: -likely 2/2 vein graft harvesting, prior DVT, age, sedentary lifestyle -she will start wearing compression stockings -can continue current diuretic therapy may have to back off some based on renal function, needs repeat bmp next visit.    Hx of DVT/PE: -Had IVC filter placed due to severe spontaneous bleeding on anticoagulation -multiple V/Q scans with no signs of PE or CTEPH  OSA: -encourage starting back on cpap  CKD 3a: -repeat bmp today  ?Cirrhosis: -? Cirrhosis by CT in 2019 but since then imaging not suggestive and labs not consistent -added spiro to lasix -may warrant repeat imaging/elastography  Follow up with general cardiology

## 2021-02-12 NOTE — Progress Notes (Signed)
[facility]  Heart Failure Pharmacist Encounter  ReDS clip done to assess fluid status:  REDS Clip  READING= 28% (normal 25-35%)  CHEST RULER = [place] = C  Kerby Nora, PharmD, BCPS Heart Failure [facility] Pharmacist [phone]

## 2021-02-13 ENCOUNTER — Other Ambulatory Visit: Payer: Self-pay

## 2021-02-13 DIAGNOSIS — J439 Emphysema, unspecified: Secondary | ICD-10-CM | POA: Diagnosis not present

## 2021-02-13 DIAGNOSIS — I25118 Atherosclerotic heart disease of native coronary artery with other forms of angina pectoris: Secondary | ICD-10-CM | POA: Diagnosis not present

## 2021-02-13 DIAGNOSIS — I110 Hypertensive heart disease with heart failure: Secondary | ICD-10-CM | POA: Diagnosis not present

## 2021-02-13 DIAGNOSIS — M545 Low back pain, unspecified: Secondary | ICD-10-CM | POA: Diagnosis not present

## 2021-02-13 DIAGNOSIS — I82403 Acute embolism and thrombosis of unspecified deep veins of lower extremity, bilateral: Secondary | ICD-10-CM | POA: Diagnosis not present

## 2021-02-13 DIAGNOSIS — I5033 Acute on chronic diastolic (congestive) heart failure: Secondary | ICD-10-CM | POA: Diagnosis not present

## 2021-02-13 DIAGNOSIS — I480 Paroxysmal atrial fibrillation: Secondary | ICD-10-CM | POA: Diagnosis not present

## 2021-02-13 DIAGNOSIS — I25810 Atherosclerosis of coronary artery bypass graft(s) without angina pectoris: Secondary | ICD-10-CM | POA: Diagnosis not present

## 2021-02-13 DIAGNOSIS — I2699 Other pulmonary embolism without acute cor pulmonale: Secondary | ICD-10-CM | POA: Diagnosis not present

## 2021-02-13 NOTE — Patient Outreach (Signed)
[facility]) Care Management  02/13/2021  Kimberlea Schlag February 21, 1941 [id number]   Telephone call to patient for follow up from recent hospitalization.  No answer.  HIPAA compliant voice message left.    Plan: RN CM will attempt patient again within 4 business days.  [name], RN, MSN [place] Management Care Management Coordinator Direct Line [phone] Cell [phone] Toll Free: [phone]  Fax: [phone]

## 2021-02-14 ENCOUNTER — Other Ambulatory Visit: Payer: Medicare HMO

## 2021-02-15 ENCOUNTER — Other Ambulatory Visit: Payer: Self-pay

## 2021-02-15 DIAGNOSIS — R413 Other amnesia: Secondary | ICD-10-CM | POA: Diagnosis not present

## 2021-02-15 DIAGNOSIS — I5032 Chronic diastolic (congestive) heart failure: Secondary | ICD-10-CM | POA: Diagnosis not present

## 2021-02-15 NOTE — Patient Outreach (Signed)
[facility] [place]) Care Management  02/15/2021  Avangelina Flight 1941-09-29 [id number]   Telephone call to patient for follow up from recent hospitalization.  No answer.  HIPAA compliant voice message left.    Plan: RN CM will attempt patient again within 4 business days and send letter.  [name], RN, MSN [place] Management Care Management Coordinator Direct Line [phone] Cell [phone] Toll Free: [phone]  Fax: [phone]

## 2021-02-18 DIAGNOSIS — J439 Emphysema, unspecified: Secondary | ICD-10-CM | POA: Diagnosis not present

## 2021-02-18 DIAGNOSIS — I25810 Atherosclerosis of coronary artery bypass graft(s) without angina pectoris: Secondary | ICD-10-CM | POA: Diagnosis not present

## 2021-02-18 DIAGNOSIS — I2699 Other pulmonary embolism without acute cor pulmonale: Secondary | ICD-10-CM | POA: Diagnosis not present

## 2021-02-18 DIAGNOSIS — I5033 Acute on chronic diastolic (congestive) heart failure: Secondary | ICD-10-CM | POA: Diagnosis not present

## 2021-02-18 DIAGNOSIS — I110 Hypertensive heart disease with heart failure: Secondary | ICD-10-CM | POA: Diagnosis not present

## 2021-02-18 DIAGNOSIS — I82403 Acute embolism and thrombosis of unspecified deep veins of lower extremity, bilateral: Secondary | ICD-10-CM | POA: Diagnosis not present

## 2021-02-18 DIAGNOSIS — I480 Paroxysmal atrial fibrillation: Secondary | ICD-10-CM | POA: Diagnosis not present

## 2021-02-18 DIAGNOSIS — M545 Low back pain, unspecified: Secondary | ICD-10-CM | POA: Diagnosis not present

## 2021-02-18 DIAGNOSIS — I25118 Atherosclerotic heart disease of native coronary artery with other forms of angina pectoris: Secondary | ICD-10-CM | POA: Diagnosis not present

## 2021-02-19 NOTE — Progress Notes (Signed)
Cardiology Office Note   Date:  02/21/2021   ID:  [name], [place] Apr 19, 1941, MRN [id number]  PCP:  Judy Roger, MD  Cardiologist:   Judy Carnes, MD     [name] is a 80 y.o. female with CAD, diastolic CHF, CV dz, DVT/PE, GI bleeding, PAF, COPD who is being admitted due to increased SOB   History of Present Illness:   [name] is a 80 y.o.femalewith:  CAD (s/p CABG 2004; DES to LM in 2010; myovue in 2018:  Low risk with distal anterior and apical ischemia), chronic diastolic CHF, CV dz, PAD recurrent DVT/PE, GI bleeding on anticoagulation  COPD, PAF (after CABG), GERD, HL, HTN, renal insufficiency, sleep apnea (not on CPAP)      The pt was admitted to [facility] with sepsis/pneumonia. In October 2021  [name] came back to the ED in November 2021 with hypotension, weakness   Renal funciton was down  Cr 2.2   Diuretics held initially  [name] wasthe  diuresed and then eventually sent home on oral lasix 40 bid.    The pt was seen by Tonny Branch a few days after d/c   Was feeling good at that visit   Said her breathing was OK    On review of records, [name] continued to follow with [facility] after this visti  Was on lasix    At some point it appears that it may have been stopped  I saw [name] eariler in march   Was very short of breath and edematous   I recomm [name] get admitted for IV diuresis    [name] left the hosp on 02/07/21  Since d/c [name] has felt OK  Actually today [name] feels the best [name] has since d/c  Breathing is OK  (on O2)   LE has mild edema    [name] weighs herselft in am   168 lbs   It has been steady      Current Meds  Medication Sig   allopurinol (ZYLOPRIM) 100 MG tablet Take 100 mg by mouth daily with breakfast.   ARIPiprazole (ABILIFY) 2 MG tablet Take 1 mg by mouth daily with breakfast.   aspirin EC 81 MG tablet Take 81 mg by mouth daily with breakfast. Swallow whole.   Aspirin-Salicylamide-Caffeine (BC HEADACHE POWDER PO) Take 1 packet by mouth 2 (two)  times daily as needed (severe headache).   atorvastatin (LIPITOR) 20 MG tablet Take 20 mg by mouth daily with supper.   cholecalciferol (VITAMIN D3) 25 MCG (1000 UNIT) tablet Take 1,000 Units by mouth daily with supper.   citalopram (CELEXA) 40 MG tablet Take 40 mg by mouth daily with supper.   cyclobenzaprine (FLEXERIL) 10 MG tablet Take 10 mg by mouth 2 (two) times daily.   ferrous sulfate 325 (65 FE) MG tablet Take 325 mg by mouth daily with breakfast.   furosemide (LASIX) 40 MG tablet Take 1 tablet (40 mg total) by mouth daily.   gabapentin (NEURONTIN) 100 MG capsule Take 100 mg by mouth daily with breakfast.   Ipratropium-Albuterol (COMBIVENT RESPIMAT) 20-100 MCG/ACT AERS respimat Inhale 1 puff into the lungs 3 (three) times daily as needed for wheezing or shortness of breath.   levothyroxine (SYNTHROID) 50 MCG tablet Take 1 tablet (50 mcg total) by mouth daily at 6 (six) AM.   magnesium oxide (MAG-OX) 400 (241.3 Mg) MG tablet Take 1 tablet (400 mg total) by mouth 2 (two) times daily.   Melatonin 10  MG TABS Take 30 mg by mouth at bedtime.   nitroGLYCERIN (NITROSTAT) 0.4 MG SL tablet Place 1 tablet (0.4 mg total) under the tongue every 5 (five) minutes as needed for chest pain. For chest pain   omeprazole (PRILOSEC) 40 MG capsule TAKE 1 CAPSULE BY MOUTH 2 TIMES DAILY.   OXYGEN Inhale 4 L/min into the lungs as needed (DURING ALL TIMES OF EXERTION).    pilocarpine (PILOCAR) 1 % ophthalmic solution Place 1 drop into both eyes 2 (two) times daily.   potassium chloride SA (KLOR-CON) 20 MEQ tablet Take 20 mEq by mouth daily with lunch.   pramipexole (MIRAPEX) 0.25 MG tablet Take 0.25 mg by mouth daily with supper.   roflumilast (DALIRESP) 500 MCG TABS tablet Take 500 mcg by mouth daily with breakfast.   spironolactone (ALDACTONE) 25 MG tablet Take 0.5 tablets (12.5 mg total) by mouth daily.   traZODone (DESYREL) 50 MG tablet Take 150 mg by mouth at bedtime.    vitamin C  (ASCORBIC ACID) 500 MG tablet Take 500 mg by mouth daily with lunch.   XTAMPZA ER 9 MG C12A Take 9 mg by mouth 2 (two) times daily.     Allergies:   Ativan [lorazepam], Pitavastatin, Ropinirole, Zofran [ondansetron hcl], Zolpidem tartrate, and Penicillins   Past Medical History:  Diagnosis Date   Anemia    bld. transfusion post lumbar surgery- 2012   Anxiety    Arthralgia    NOS   Blood transfusion 2012; 02/2018   WITH BACK SURGERY; "related to hematoma in stomach" (10/06/2018)   CAD (coronary artery disease)    s/p CABG 2004; s/p DES to LM in 2010;  [place] 10/29/11: EF 50-55%, mild elevated filling pressures, no pulmonary HTN, LM 90% ISR, LAD and CFX occluded, S-RI occluded (old), S-OM3 ok and L-LAD ok, native nondominant RCA 95% -  med rx recommended ; Lexiscan Myoview 7/13 at [facility]: demonstrated "normal LV function, anterior attenuation and localized ischemia, inferior, basilar, mid section"   Carotid artery disease ([place])    Carotid US 6/21:  RICA 3-08; LICA 65-78; R subclavian stenosis - Repeat 1 year. // Carotid US 05/2019: R 1-39; L 40-59; R vertebral with atypical antegrade flow; R subclavian stenosis >> repeat 1 year // Carotid US 7/21: Bilat 40-59; L subclavian stenosis    CHF (congestive heart failure) ([place]) 01/21/2020   Chronic diastolic heart failure (HCC)    Echo 9/10: EF 46-96%, grade 1 diastolic dysfunction   Chronic lower back pain    COPD (chronic obstructive pulmonary disease) ([place])    Emphysema dxed by Dr. Woody Seller in [place] based on PFTs per pt in 2006; placed on albuterol   Depression    TAKES CELEXA  AND  (OFF- WELBUTRIN)   Diuretic-induced hypokalemia 10/08/2018   DVT of lower extremity (deep venous thrombosis) (HCC)    recurrent. bilateral (2 episodes)   Dyspnea    home o2 when needed   Dysrhythmia    afib with cabg   Exertional angina (HCC)    Treated with Isosorbide, Ranexa, amlodipine; intolerant to metoprolol   GERD (gastroesophageal  reflux disease)    Gout    "on daily RX" (10/06/2018)   HLD (hyperlipidemia)    Hypertension    Hyperthyroidism    L Subclavian Artery Stenosis    Carotid US 7/21    Obesity (BMI 30-39.9) 2009   BMI 33   Osteoarthritis    "all over; hands" (10/06/2018)   Oxygen deficiency    Oxygen dependent  4L at home as needed (10/06/2018)   Pneumonia    PVD (peripheral vascular disease) ([place])    Sleep apnea 2012   USED CPAP THEN  STARTED USING SPIRIVA , Nov. 2013- last evaluation , changed from mask to aparatus that is just for her nose.. ; reports 05-28-18 "the mask smothers me so i dont use it right now" (10/06/2018)    Past Surgical History:  Procedure Laterality Date   ANKLE SURGERY  2004   ANTERIOR CERVICAL DECOMP/DISCECTOMY FUSION  11/2007   [place]   lower; another scheduled, opt. reports 4 back- lumbar, 3 cerv. fusions  for later 2009   CARDIAC CATHETERIZATION  11/2009   Patent LIMA to LAD and patent SVG to OM1. Occluded SVG to ramus and diagonal. Left main: 90% ostial stenosis, LCX 60-70% proximal stenosis   CATARACT EXTRACTION W/ INTRAOCULAR LENS  IMPLANT, BILATERAL Bilateral 2006   COLONOSCOPY WITH PROPOFOL N/A 02/17/2017   Procedure: COLONOSCOPY WITH PROPOFOL;  Surgeon: Jerene Bears, MD;  Location: [facility] ENDOSCOPY;  Service: Endoscopy;  Laterality: N/A;   CORONARY ANGIOPLASTY WITH STENT PLACEMENT  11/2009   Drug eluting stent to left main artery: 4.0 X 12 mm Ion    CORONARY ARTERY BYPASS GRAFT  2004   "CABG X4"   ESOPHAGOGASTRODUODENOSCOPY N/A 02/14/2017   Procedure: ESOPHAGOGASTRODUODENOSCOPY (EGD);  Surgeon: Doran Stabler, MD;  Location: [facility] ENDOSCOPY;  Service: Endoscopy;  Laterality: N/A;   GIVENS CAPSULE STUDY N/A 02/15/2017   Procedure: GIVENS CAPSULE STUDY;  Surgeon: Doran Stabler, MD;  Location: [place];  Service: Endoscopy;  Laterality: N/A;   GROIN MASS OPEN BIOPSY  2004   IR IVC FILTER PLMT /  S&I /IMG GUID/MOD SED  03/14/2018   IR PARACENTESIS  03/10/2018   JOINT REPLACEMENT     LAPAROSCOPIC CHOLECYSTECTOMY     PERIPHERAL VASCULAR INTERVENTION Left 03/05/2018   Procedure: PERIPHERAL VASCULAR INTERVENTION;  Surgeon: Elam Dutch, MD;  Location: [place] [facility];  Service: Cardiovascular;  Laterality: Left;  Attempted unsuccess\ful Per Dr. Eden Lathe   PERIPHERAL VASCULAR INTERVENTION  03/08/2018   Procedure: PERIPHERAL VASCULAR INTERVENTION;  Surgeon: Waynetta Sandy, MD;  Location: [place] CV LAB;  Service: Cardiovascular;;  SMA Stent    REPLACEMENT TOTAL KNEE BILATERAL Bilateral 2012   REVERSE SHOULDER ARTHROPLASTY  02/26/2012   Procedure: REVERSE SHOULDER ARTHROPLASTY;  Surgeon: Marin Shutter, MD;  Location: [place];  Service: Orthopedics;  Laterality: Right;  RIGHT SHOULDER REVERSED ARTHROPLASTY   SHOULDER ARTHROSCOPY WITH SUBACROMIAL DECOMPRESSION Left 02/10/2013   Procedure: SHOULDER ARTHROSCOPY WITH SUBACROMIAL DECOMPRESSION DISTAL CLAVICLE RESECTION;  Surgeon: Marin Shutter, MD;  Location: [place];  Service: Orthopedics;  Laterality: Left;  DISTAL CLAVICLE RESECTION   TONSILLECTOMY     TOTAL HIP ARTHROPLASTY  2007   Right   TOTAL HIP ARTHROPLASTY Left 02/07/2020   Procedure: TOTAL HIP ARTHROPLASTY ANTERIOR APPROACH;  Surgeon: Paralee Cancel, MD;  Location: [facility];  Service: Orthopedics;  Laterality: Left;  70 mins   TRANSURETHRAL RESECTION OF BLADDER TUMOR N/A 05/31/2018   Procedure: TRANSURETHRAL RESECTION OF BLADDER TUMOR (TURBT);  Surgeon: Lucas Mallow, MD;  Location: [facility];  Service: Urology;  Laterality: N/A;   VISCERAL ANGIOGRAPHY N/A 03/05/2018   Procedure: VISCERAL ANGIOGRAPHY;  Surgeon: Elam Dutch, MD;  Location: [place] [facility];  Service: Cardiovascular;  Laterality: N/A;   VISCERAL ANGIOGRAPHY N/A 03/08/2018   Procedure: VISCERAL ANGIOGRAPHY;  Surgeon: Donzetta Matters,  Georgia Dom, MD;  Location: [place] [facility];  Service:  Cardiovascular;  Laterality: N/A;     Social History:  The patient  reports that [name] quit smoking about 20 years ago. Her smoking use included cigarettes. [name] has a 45.00 pack-year smoking history. [name] has never used smokeless tobacco. [name] reports that [name] does not drink alcohol and does not use drugs.   Family History:  The patient's family history includes COPD in her father; Colitis in her sister; Heart disease in her father and sister; Lung cancer in her father; Ovarian cancer in her mother; Stomach cancer in her sister.    ROS:  Please see the history of present illness. All other systems are reviewed and  Negative to the above problem except as noted.    PHYSICAL EXAM: VS:  BP 120/60    Pulse 87    Ht 5\' 8"  (1.727 m)    Wt 170 lb 12.8 oz (77.5 kg)    SpO2 94%    BMI 25.97 kg/m   GEN: Well nourished, well developed, in no acute distress  HEENT: normal  Neck: no JVD, carotid bruits, or masses Cardiac: RRR; no murmurs, rubs, or gallops, 1+ LE edema  Respiratory:  clear to auscultation bilaterally, normal work of breathing GI: soft, nontender, nondistended, + BS  No hepatomegaly  MS: no deformity Moving all extremities   Skin: warm and dry, no rash Neuro:  CN II to XII intact   Psych: euthymic mood, full affect   EKG:  EKG is not  ordered today.   Lipid Panel    Component Value Date/Time   CHOL 89 (L) 03/08/2020 1158   CHOL 98 05/23/2014 0857   TRIG 76 03/08/2020 1158   TRIG 83 05/23/2014 0857   HDL 37 (L) 03/08/2020 1158   HDL 35 (L) 05/23/2014 0857   CHOLHDL 2.4 03/08/2020 1158   CHOLHDL 4 03/03/2013 1136   VLDL 24.4 03/03/2013 1136   LDLCALC 36 03/08/2020 1158   LDLCALC 46 05/23/2014 0857      Wt Readings from Last 3 Encounters:  02/21/21 170 lb 12.8 oz (77.5 kg)  02/12/21 167 lb (75.8 kg)  02/07/21 165 lb 6.4 oz (75 kg)      ASSESSMENT AND PLAN:  1  Acute on chronic diastllic CHF   Pt is clinically improved from when I saw her in Feb    Wt steady   I  reinforced with her today the importance of daily am wts and also watching salt   Call if wt goes up  2  CAD  Last intervention in 2010  Myovue normal in  2018    No symptoms of angina   3   HTN  BP is controlled     4  COPD   Continue on O2    Current medicines are reviewed at length with the patient today.  The patient does not have concerns regarding medicines.  Signed, Judy Carnes, MD  02/21/2021 1:46 PM    [facility] [place], [place], [place]  [address] Phone: [phone]; Fax: [phone]

## 2021-02-20 ENCOUNTER — Other Ambulatory Visit: Payer: Self-pay

## 2021-02-20 NOTE — Patient Outreach (Signed)
[facility]) Care Management  [place]  02/20/2021   [name] 1941-04-13 [id number]  Subjective: Telephone call to patient for follow up from recent hospitalization. Patient reports she had problems with her heart failure.  Discussed heart failure management. Patient has seen cardiologist and PCP since coming out the hospital and will see heart DM again tomorrow.  Patient lives alone but has plenty of family support who come by multiple times a day.  Granddaughter still helps to manage medications.     Objective:   Encounter Medications:  Outpatient Encounter Medications as of 02/20/2021  Medication Sig Note  . allopurinol (ZYLOPRIM) 100 MG tablet Take 100 mg by mouth daily with breakfast.   . ARIPiprazole (ABILIFY) 2 MG tablet Take 1 mg by mouth daily with breakfast.   . aspirin EC 81 MG tablet Take 81 mg by mouth daily with breakfast. Swallow whole.   . Aspirin-Salicylamide-Caffeine (BC HEADACHE POWDER PO) Take 1 packet by mouth 2 (two) times daily as needed (severe headache).   Marland Kitchen atorvastatin (LIPITOR) 20 MG tablet Take 20 mg by mouth daily with supper.   . cholecalciferol (VITAMIN D3) 25 MCG (1000 UNIT) tablet Take 1,000 Units by mouth daily with supper.   . citalopram (CELEXA) 40 MG tablet Take 40 mg by mouth daily with supper.   . cyclobenzaprine (FLEXERIL) 10 MG tablet Take 10 mg by mouth 2 (two) times daily.   . ferrous sulfate 325 (65 FE) MG tablet Take 325 mg by mouth daily with breakfast.   . furosemide (LASIX) 40 MG tablet Take 1 tablet (40 mg total) by mouth daily.   Marland Kitchen gabapentin (NEURONTIN) 100 MG capsule Take 100 mg by mouth daily with breakfast.   . Ipratropium-Albuterol (COMBIVENT RESPIMAT) 20-100 MCG/ACT AERS respimat Inhale 1 puff into the lungs 3 (three) times daily as needed for wheezing or shortness of breath.   . levothyroxine (SYNTHROID) 50 MCG tablet Take 1 tablet (50 mcg total) by mouth daily at 6 (six) AM.   . magnesium oxide  (MAG-OX) 400 (241.3 Mg) MG tablet Take 1 tablet (400 mg total) by mouth 2 (two) times daily.   . Melatonin 10 MG TABS Take 30 mg by mouth at bedtime.   . nitroGLYCERIN (NITROSTAT) 0.4 MG SL tablet Place 1 tablet (0.4 mg total) under the tongue every 5 (five) minutes as needed for chest pain. For chest pain   . omeprazole (PRILOSEC) 40 MG capsule TAKE 1 CAPSULE BY MOUTH 2 TIMES DAILY.   Marland Kitchen OXYGEN Inhale 4 L/min into the lungs as needed (DURING ALL TIMES OF EXERTION).    Marland Kitchen pilocarpine (PILOCAR) 1 % ophthalmic solution Place 1 drop into both eyes 2 (two) times daily.   . potassium chloride SA (KLOR-CON) 20 MEQ tablet Take 20 mEq by mouth daily with lunch.   . pramipexole (MIRAPEX) 0.25 MG tablet Take 0.25 mg by mouth daily with supper.   . roflumilast (DALIRESP) 500 MCG TABS tablet Take 500 mcg by mouth daily with breakfast.   . traZODone (DESYREL) 50 MG tablet Take 150 mg by mouth at bedtime.    . vitamin C (ASCORBIC ACID) 500 MG tablet Take 500 mg by mouth daily with lunch.   Ginger Organ ER 9 MG C12A Take 9 mg by mouth 2 (two) times daily. 02/04/2021: #90/30 days filled 01/02/2021 per bottle - pt takes twice daily  . spironolactone (ALDACTONE) 25 MG tablet Take 0.5 tablets (12.5 mg total) by mouth daily. (Patient not taking: Reported  on 02/20/2021)    No facility-administered encounter medications on file as of 02/20/2021.    Functional Status:  In your present state of health, do you have any difficulty performing the following activities: 02/20/2021 02/04/2021  Hearing? N N  Vision? N N  Difficulty concentrating or making decisions? Y N  Comment forgets things at times -  Walking or climbing stairs? Y Y  Comment weakness at times- family assists -  Dressing or bathing? Y N  Comment granddaughter helps -  Doing errands, shopping? [name]  Comment family helps -  Conservation officer, nature and eating ? Y -  Comment family helps -  Using the Toilet? N -  In the past six months, have you accidently leaked urine? N  -  Do you have problems with loss of bowel control? N -  Managing your Medications? Y -  Comment granddaughter helps -  Managing your Finances? N -  Comment family Helps -  Housekeeping or managing your Housekeeping? (No Data) -  Comment family helps -  Some recent data might be hidden    Fall/Depression Screening: Fall Risk  02/20/2021 01/10/2021 11/07/2020  Falls in the past year? 0 0 0  Comment - - -  Number falls in past yr: - - -  Injury with Fall? - - -  Risk for fall due to : - - -  Follow up - - -   PHQ 2/9 Scores 01/10/2021 11/07/2020 08/15/2020 03/30/2020 10/11/2019 10/12/2018 05/13/2018  PHQ - 2 Score 0 0 1 0 2 0 6  PHQ- 9 Score - - - - 17 - 20    Assessment: Patient with recent hospitalization related to heart failure.  Patient back on lasix and weight 165 lbs.  Goals Addressed            This Visit's Progress   . Matintain My Quality of Life       Timeframe:  Short-Term Goal Priority:  High Start Date:  02/20/21                           Expected End Date:   03/23/21                    Follow Up Date 03/21/21  - discuss my treatment options with the doctor or nurse - make shared treatment decisions with doctor    Why is this important?    Having a long-term illness can be scary.   It can also be stressful for you and your caregiver.   These steps may help.    Notes: 02/20/21 patient strives to maintain independence but has supportive family.    Linward Headland and Keep All Appointments   On track    Timeframe:  Long-Range Goal Priority:  High Start Date:   12/20/20                          Expected End Date:08/21/21              Follow Up Date 03/21/21   - call to cancel if needed    Why is this important?   Part of staying healthy is seeing the doctor for follow-up care.  If you forget your appointments, there are some things you can do to stay on track.    Notes: Family takes to appointments    . THN-Track and Manage Fluids and Swelling  On track     Timeframe:  Long-Range Goal Priority:  High Start Date:   12/20/20                          Expected End Date: 08/21/21           Follow Up Date 03/21/21   - call office if I gain more than 2 pounds in one day or 5 pounds in one week - weigh myself daily    Why is this important?   It is important to check your weight daily and watch how much salt and liquids you have.  It will help you to manage your heart failure.    Notes: Patient weighs daily. 12/20/20 173 lbs 01/10/21- 171 lbs. 02/20/21 patient weight 165 lbs       Plan: RN CM will provide ongoing education and support to patient through phone calls.   RN CM will send updated welcome letter.   RN CM will send initial barriers letter, assessment, and care plan to primary care physician.   RN CM will contact patient again this month. Follow-up:  Patient agrees to Care Plan and Follow-up.

## 2021-02-20 NOTE — Patient Instructions (Signed)
Goals Addressed            This Visit's Progress   . Matintain My Quality of Life       Timeframe:  Short-Term Goal Priority:  High Start Date:  02/20/21                           Expected End Date:   03/23/21                    Follow Up Date 03/21/21  - discuss my treatment options with the doctor or nurse - make shared treatment decisions with doctor    Why is this important?    Having a long-term illness can be scary.   It can also be stressful for you and your caregiver.   These steps may help.    Notes: 02/20/21 patient strives to maintain independence but has supportive family.    Linward Headland and Keep All Appointments   On track    Timeframe:  Long-Range Goal Priority:  High Start Date:   12/20/20                          Expected End Date:08/21/21              Follow Up Date 03/21/21   - call to cancel if needed    Why is this important?   Part of staying healthy is seeing the doctor for follow-up care.  If you forget your appointments, there are some things you can do to stay on track.    Notes: Family takes to appointments    . THN-Track and Manage Fluids and Swelling   On track    Timeframe:  Long-Range Goal Priority:  High Start Date:   12/20/20                          Expected End Date: 08/21/21           Follow Up Date 03/21/21   - call office if I gain more than 2 pounds in one day or 5 pounds in one week - weigh myself daily    Why is this important?   It is important to check your weight daily and watch how much salt and liquids you have.  It will help you to manage your heart failure.    Notes: Patient weighs daily. 12/20/20 173 lbs 01/10/21- 171 lbs. 02/20/21 patient weight 165 lbs

## 2021-02-21 ENCOUNTER — Encounter: Payer: Self-pay | Admitting: Internal Medicine

## 2021-02-21 ENCOUNTER — Other Ambulatory Visit: Payer: Self-pay

## 2021-02-21 ENCOUNTER — Ambulatory Visit (INDEPENDENT_AMBULATORY_CARE_PROVIDER_SITE_OTHER): Payer: Medicare HMO | Admitting: Internal Medicine

## 2021-02-21 VITALS — BP 120/60 | HR 87 | Ht 68.0 in | Wt 170.8 lb

## 2021-02-21 DIAGNOSIS — I480 Paroxysmal atrial fibrillation: Secondary | ICD-10-CM | POA: Diagnosis not present

## 2021-02-21 DIAGNOSIS — I5032 Chronic diastolic (congestive) heart failure: Secondary | ICD-10-CM | POA: Diagnosis not present

## 2021-02-21 DIAGNOSIS — I82403 Acute embolism and thrombosis of unspecified deep veins of lower extremity, bilateral: Secondary | ICD-10-CM | POA: Diagnosis not present

## 2021-02-21 DIAGNOSIS — I25118 Atherosclerotic heart disease of native coronary artery with other forms of angina pectoris: Secondary | ICD-10-CM | POA: Diagnosis not present

## 2021-02-21 DIAGNOSIS — I25810 Atherosclerosis of coronary artery bypass graft(s) without angina pectoris: Secondary | ICD-10-CM | POA: Diagnosis not present

## 2021-02-21 DIAGNOSIS — I110 Hypertensive heart disease with heart failure: Secondary | ICD-10-CM | POA: Diagnosis not present

## 2021-02-21 DIAGNOSIS — I2699 Other pulmonary embolism without acute cor pulmonale: Secondary | ICD-10-CM | POA: Diagnosis not present

## 2021-02-21 DIAGNOSIS — I5033 Acute on chronic diastolic (congestive) heart failure: Secondary | ICD-10-CM | POA: Diagnosis not present

## 2021-02-21 DIAGNOSIS — M545 Low back pain, unspecified: Secondary | ICD-10-CM | POA: Diagnosis not present

## 2021-02-21 DIAGNOSIS — J439 Emphysema, unspecified: Secondary | ICD-10-CM | POA: Diagnosis not present

## 2021-02-21 NOTE — Patient Instructions (Signed)
Medication Instructions:  No changes *If you need a refill on your cardiac medications before your next appointment, please call your pharmacy*   Lab Work: Today: BMET, BNP  If you have labs (blood work) drawn today and your tests are completely normal, you will receive your results only by: Marland Kitchen MyChart Message (if you have MyChart) OR . A paper copy in the mail If you have any lab test that is abnormal or we need to change your treatment, we will call you to review the results.   Testing/Procedures: none   Follow-Up: At [facility], you and your health needs are our priority.  As part of our continuing mission to provide you with exceptional heart care, we have created designated Provider Care Teams.  These Care Teams include your primary Cardiologist (physician) and Advanced Practice Providers (APPs -  Physician Assistants and Nurse Practitioners) who all work together to provide you with the care you need, when you need it.  Your next appointment:   2 month(s)  The format for your next appointment:   In Person  Provider:   You may see Dorris Carnes, MD or one of the following Advanced Practice Providers on your designated Care Team:    [name], PA-C  Robbie Lis, [place]    Other Instructions

## 2021-02-22 DIAGNOSIS — J449 Chronic obstructive pulmonary disease, unspecified: Secondary | ICD-10-CM | POA: Diagnosis not present

## 2021-02-22 LAB — BASIC METABOLIC PANEL
BUN/Creatinine Ratio: 25 (ref 12–28)
BUN: 25 mg/dL (ref 8–27)
CO2: 27 mmol/L (ref 20–29)
Calcium: 9.7 mg/dL (ref 8.7–10.3)
Chloride: 100 mmol/L (ref 96–106)
Creatinine, Ser: 1.02 mg/dL — ABNORMAL HIGH (ref 0.57–1.00)
Glucose: 86 mg/dL (ref 65–99)
Potassium: 4.1 mmol/L (ref 3.5–5.2)
Sodium: 142 mmol/L (ref 134–144)
eGFR: 56 mL/min/{1.73_m2} — ABNORMAL LOW (ref >59)

## 2021-02-22 LAB — PRO B NATRIURETIC PEPTIDE: NT-Pro BNP: 1507 pg/mL — ABNORMAL HIGH (ref 0–738)

## 2021-02-25 DIAGNOSIS — I25810 Atherosclerosis of coronary artery bypass graft(s) without angina pectoris: Secondary | ICD-10-CM | POA: Diagnosis not present

## 2021-02-25 DIAGNOSIS — I110 Hypertensive heart disease with heart failure: Secondary | ICD-10-CM | POA: Diagnosis not present

## 2021-02-25 DIAGNOSIS — I5033 Acute on chronic diastolic (congestive) heart failure: Secondary | ICD-10-CM | POA: Diagnosis not present

## 2021-02-25 DIAGNOSIS — I2699 Other pulmonary embolism without acute cor pulmonale: Secondary | ICD-10-CM | POA: Diagnosis not present

## 2021-02-25 DIAGNOSIS — I82403 Acute embolism and thrombosis of unspecified deep veins of lower extremity, bilateral: Secondary | ICD-10-CM | POA: Diagnosis not present

## 2021-02-25 DIAGNOSIS — I25118 Atherosclerotic heart disease of native coronary artery with other forms of angina pectoris: Secondary | ICD-10-CM | POA: Diagnosis not present

## 2021-02-25 DIAGNOSIS — M545 Low back pain, unspecified: Secondary | ICD-10-CM | POA: Diagnosis not present

## 2021-02-25 DIAGNOSIS — J439 Emphysema, unspecified: Secondary | ICD-10-CM | POA: Diagnosis not present

## 2021-02-25 DIAGNOSIS — I480 Paroxysmal atrial fibrillation: Secondary | ICD-10-CM | POA: Diagnosis not present

## 2021-02-26 ENCOUNTER — Telehealth: Payer: Self-pay | Admitting: *Deleted

## 2021-02-26 DIAGNOSIS — I5032 Chronic diastolic (congestive) heart failure: Secondary | ICD-10-CM

## 2021-02-26 NOTE — Telephone Encounter (Signed)
Reviewed results and plan to repeat labs in 3 weeks.  Weights have been stable.  Aware to watch salt and  call if increase of 3 pounds overnight or 5 pounds in one week.

## 2021-02-26 NOTE — Telephone Encounter (Signed)
-----   Message from Dorris Carnes V, MD sent at 02/26/2021 12:09 PM EST ----- Electrolytes and kidney function OK Fluid is up a little    Watch salt Check BMET and BNP in 3 wks    Keep weighing daily

## 2021-03-01 DIAGNOSIS — M545 Low back pain, unspecified: Secondary | ICD-10-CM | POA: Diagnosis not present

## 2021-03-01 DIAGNOSIS — I82403 Acute embolism and thrombosis of unspecified deep veins of lower extremity, bilateral: Secondary | ICD-10-CM | POA: Diagnosis not present

## 2021-03-01 DIAGNOSIS — I480 Paroxysmal atrial fibrillation: Secondary | ICD-10-CM | POA: Diagnosis not present

## 2021-03-01 DIAGNOSIS — I25810 Atherosclerosis of coronary artery bypass graft(s) without angina pectoris: Secondary | ICD-10-CM | POA: Diagnosis not present

## 2021-03-01 DIAGNOSIS — J439 Emphysema, unspecified: Secondary | ICD-10-CM | POA: Diagnosis not present

## 2021-03-01 DIAGNOSIS — I5033 Acute on chronic diastolic (congestive) heart failure: Secondary | ICD-10-CM | POA: Diagnosis not present

## 2021-03-01 DIAGNOSIS — I25118 Atherosclerotic heart disease of native coronary artery with other forms of angina pectoris: Secondary | ICD-10-CM | POA: Diagnosis not present

## 2021-03-01 DIAGNOSIS — I110 Hypertensive heart disease with heart failure: Secondary | ICD-10-CM | POA: Diagnosis not present

## 2021-03-01 DIAGNOSIS — I2699 Other pulmonary embolism without acute cor pulmonale: Secondary | ICD-10-CM | POA: Diagnosis not present

## 2021-03-04 DIAGNOSIS — R0600 Dyspnea, unspecified: Secondary | ICD-10-CM | POA: Diagnosis not present

## 2021-03-04 DIAGNOSIS — J449 Chronic obstructive pulmonary disease, unspecified: Secondary | ICD-10-CM | POA: Diagnosis not present

## 2021-03-05 DIAGNOSIS — I480 Paroxysmal atrial fibrillation: Secondary | ICD-10-CM | POA: Diagnosis not present

## 2021-03-05 DIAGNOSIS — I5033 Acute on chronic diastolic (congestive) heart failure: Secondary | ICD-10-CM | POA: Diagnosis not present

## 2021-03-05 DIAGNOSIS — M545 Low back pain, unspecified: Secondary | ICD-10-CM | POA: Diagnosis not present

## 2021-03-05 DIAGNOSIS — I110 Hypertensive heart disease with heart failure: Secondary | ICD-10-CM | POA: Diagnosis not present

## 2021-03-05 DIAGNOSIS — I2699 Other pulmonary embolism without acute cor pulmonale: Secondary | ICD-10-CM | POA: Diagnosis not present

## 2021-03-05 DIAGNOSIS — I25118 Atherosclerotic heart disease of native coronary artery with other forms of angina pectoris: Secondary | ICD-10-CM | POA: Diagnosis not present

## 2021-03-05 DIAGNOSIS — J439 Emphysema, unspecified: Secondary | ICD-10-CM | POA: Diagnosis not present

## 2021-03-05 DIAGNOSIS — I25810 Atherosclerosis of coronary artery bypass graft(s) without angina pectoris: Secondary | ICD-10-CM | POA: Diagnosis not present

## 2021-03-05 DIAGNOSIS — I82403 Acute embolism and thrombosis of unspecified deep veins of lower extremity, bilateral: Secondary | ICD-10-CM | POA: Diagnosis not present

## 2021-03-07 DIAGNOSIS — I5032 Chronic diastolic (congestive) heart failure: Secondary | ICD-10-CM | POA: Diagnosis not present

## 2021-03-07 DIAGNOSIS — R413 Other amnesia: Secondary | ICD-10-CM | POA: Diagnosis not present

## 2021-03-08 DIAGNOSIS — J449 Chronic obstructive pulmonary disease, unspecified: Secondary | ICD-10-CM | POA: Diagnosis not present

## 2021-03-12 ENCOUNTER — Ambulatory Visit: Payer: Medicare HMO | Admitting: Internal Medicine

## 2021-03-13 DIAGNOSIS — I2699 Other pulmonary embolism without acute cor pulmonale: Secondary | ICD-10-CM | POA: Diagnosis not present

## 2021-03-13 DIAGNOSIS — I82403 Acute embolism and thrombosis of unspecified deep veins of lower extremity, bilateral: Secondary | ICD-10-CM | POA: Diagnosis not present

## 2021-03-13 DIAGNOSIS — I110 Hypertensive heart disease with heart failure: Secondary | ICD-10-CM | POA: Diagnosis not present

## 2021-03-13 DIAGNOSIS — I480 Paroxysmal atrial fibrillation: Secondary | ICD-10-CM | POA: Diagnosis not present

## 2021-03-13 DIAGNOSIS — I25118 Atherosclerotic heart disease of native coronary artery with other forms of angina pectoris: Secondary | ICD-10-CM | POA: Diagnosis not present

## 2021-03-13 DIAGNOSIS — I5033 Acute on chronic diastolic (congestive) heart failure: Secondary | ICD-10-CM | POA: Diagnosis not present

## 2021-03-13 DIAGNOSIS — M545 Low back pain, unspecified: Secondary | ICD-10-CM | POA: Diagnosis not present

## 2021-03-13 DIAGNOSIS — J439 Emphysema, unspecified: Secondary | ICD-10-CM | POA: Diagnosis not present

## 2021-03-13 DIAGNOSIS — I25810 Atherosclerosis of coronary artery bypass graft(s) without angina pectoris: Secondary | ICD-10-CM | POA: Diagnosis not present

## 2021-03-14 DIAGNOSIS — I10 Essential (primary) hypertension: Secondary | ICD-10-CM | POA: Diagnosis not present

## 2021-03-14 DIAGNOSIS — Z1339 Encounter for screening examination for other mental health and behavioral disorders: Secondary | ICD-10-CM | POA: Diagnosis not present

## 2021-03-14 DIAGNOSIS — E7800 Pure hypercholesterolemia, unspecified: Secondary | ICD-10-CM | POA: Diagnosis not present

## 2021-03-14 DIAGNOSIS — I25810 Atherosclerosis of coronary artery bypass graft(s) without angina pectoris: Secondary | ICD-10-CM | POA: Diagnosis not present

## 2021-03-14 DIAGNOSIS — Z0000 Encounter for general adult medical examination without abnormal findings: Secondary | ICD-10-CM | POA: Diagnosis not present

## 2021-03-14 DIAGNOSIS — E039 Hypothyroidism, unspecified: Secondary | ICD-10-CM | POA: Diagnosis not present

## 2021-03-14 DIAGNOSIS — J449 Chronic obstructive pulmonary disease, unspecified: Secondary | ICD-10-CM | POA: Diagnosis not present

## 2021-03-14 DIAGNOSIS — Z1331 Encounter for screening for depression: Secondary | ICD-10-CM | POA: Diagnosis not present

## 2021-03-14 DIAGNOSIS — Z6829 Body mass index (BMI) 29.0-29.9, adult: Secondary | ICD-10-CM | POA: Diagnosis not present

## 2021-03-19 ENCOUNTER — Other Ambulatory Visit: Payer: Medicare HMO

## 2021-03-19 ENCOUNTER — Other Ambulatory Visit: Payer: Self-pay

## 2021-03-19 DIAGNOSIS — I5032 Chronic diastolic (congestive) heart failure: Secondary | ICD-10-CM | POA: Diagnosis not present

## 2021-03-19 NOTE — Patient Outreach (Signed)
[place] [facility]) Care Management  03/19/2021  [name] August 24, 1941 [id number]   Telephone call to patient for follow up. Patient states she cannot talk right now.    Plan: RN CM will attempt patient again in the month of April.    [name], RN, MSN [place] Management Care Management Coordinator Direct Line [phone] Cell [phone] Toll Free: [phone]  Fax: [phone]

## 2021-03-20 DIAGNOSIS — I110 Hypertensive heart disease with heart failure: Secondary | ICD-10-CM | POA: Diagnosis not present

## 2021-03-20 DIAGNOSIS — I82403 Acute embolism and thrombosis of unspecified deep veins of lower extremity, bilateral: Secondary | ICD-10-CM | POA: Diagnosis not present

## 2021-03-20 DIAGNOSIS — I25118 Atherosclerotic heart disease of native coronary artery with other forms of angina pectoris: Secondary | ICD-10-CM | POA: Diagnosis not present

## 2021-03-20 DIAGNOSIS — I25810 Atherosclerosis of coronary artery bypass graft(s) without angina pectoris: Secondary | ICD-10-CM | POA: Diagnosis not present

## 2021-03-20 DIAGNOSIS — J439 Emphysema, unspecified: Secondary | ICD-10-CM | POA: Diagnosis not present

## 2021-03-20 DIAGNOSIS — I2699 Other pulmonary embolism without acute cor pulmonale: Secondary | ICD-10-CM | POA: Diagnosis not present

## 2021-03-20 DIAGNOSIS — I5033 Acute on chronic diastolic (congestive) heart failure: Secondary | ICD-10-CM | POA: Diagnosis not present

## 2021-03-20 DIAGNOSIS — M545 Low back pain, unspecified: Secondary | ICD-10-CM | POA: Diagnosis not present

## 2021-03-20 DIAGNOSIS — I480 Paroxysmal atrial fibrillation: Secondary | ICD-10-CM | POA: Diagnosis not present

## 2021-03-20 LAB — BASIC METABOLIC PANEL
BUN/Creatinine Ratio: 18 (ref 12–28)
BUN: 19 mg/dL (ref 8–27)
CO2: 27 mmol/L (ref 20–29)
Calcium: 9.3 mg/dL (ref 8.7–10.3)
Chloride: 102 mmol/L (ref 96–106)
Creatinine, Ser: 1.06 mg/dL — ABNORMAL HIGH (ref 0.57–1.00)
Glucose: 96 mg/dL (ref 65–99)
Potassium: 4.4 mmol/L (ref 3.5–5.2)
Sodium: 144 mmol/L (ref 134–144)
eGFR: 53 mL/min/{1.73_m2} — ABNORMAL LOW (ref >59)

## 2021-03-20 LAB — PRO B NATRIURETIC PEPTIDE: NT-Pro BNP: 1355 pg/mL — ABNORMAL HIGH (ref 0–738)

## 2021-03-21 ENCOUNTER — Telehealth: Payer: Self-pay | Admitting: *Deleted

## 2021-03-21 NOTE — Telephone Encounter (Signed)
Left message for pt to call back  °

## 2021-03-21 NOTE — Telephone Encounter (Signed)
-----   Message from Fay Records, MD sent at 03/21/2021  6:14 AM EDT ----- Fluid is still up Kidney function is normal She can try increasing to 60 mg every 3rd day. (60/40/40)   Watch salt    may at some point increase to 60/40 alternating if still with increased fluid So  Try 60/40/40   Continue K F/U BMET and BNP in 3 wk Watch salt I

## 2021-03-22 ENCOUNTER — Other Ambulatory Visit: Payer: Self-pay

## 2021-03-22 NOTE — Patient Instructions (Signed)
Goals    . THN-Make and Keep All Appointments     Timeframe:  Long-Range Goal Priority:  High Start Date:   12/20/20                          Expected End Date:08/21/21              Follow Up Date 05/21/21  - arrange a ride through an agency 1 week before appointment    Why is this important?   Part of staying healthy is seeing the doctor for follow-up care.  If you forget your appointments, there are some things you can do to stay on track.    Notes: Family takes to appointments 03/22/21 patient continues to follow up with physicians as scheduled.     . THN-Track and Manage Fluids and Swelling     Timeframe:  Long-Range Goal Priority:  High Start Date:   12/20/20                          Expected End Date: 08/21/21           Follow Up Date 03/21/21   - use salt in moderation - watch for swelling in feet, ankles and legs every day    Why is this important?   It is important to check your weight daily and watch how much salt and liquids you have.  It will help you to manage your heart failure.    Notes: Patient weighs daily. 12/20/20 173 lbs 01/10/21- 171 lbs. 02/20/21 patient weight 165 lbs  03/22/21 patient weight today 170 lbs but patient reports weight up to 176 lbs earlier this week and MD increased her lasix. Patient states she has to call to report her weight today.  Reiterated the importance of a low salt diet.

## 2021-03-22 NOTE — Patient Outreach (Signed)
[facility]) Care Management  [place]  03/22/2021   [name] 26-Dec-1940 [id number]  Subjective: Telephone call to patient for follow up. Patient reports her fluid is up with her weight 170 lbs but better than earlier in the week.  Reiterated the importance of salt intake and notifying physician of changes. Patient says she has to call the doctor's office to day to report what her weight is as they increased her lasix some this week.  Encouraged patient to call MD office early in order to receive a timely response. She verbalized understanding.   Objective:   Encounter Medications:  Outpatient Encounter Medications as of 03/22/2021  Medication Sig Note  . allopurinol (ZYLOPRIM) 100 MG tablet Take 100 mg by mouth daily with breakfast.   . ARIPiprazole (ABILIFY) 2 MG tablet Take 1 mg by mouth daily with breakfast.   . aspirin EC 81 MG tablet Take 81 mg by mouth daily with breakfast. Swallow whole.   . Aspirin-Salicylamide-Caffeine (BC HEADACHE POWDER PO) Take 1 packet by mouth 2 (two) times daily as needed (severe headache).   Marland Kitchen atorvastatin (LIPITOR) 20 MG tablet Take 20 mg by mouth daily with supper.   . cholecalciferol (VITAMIN D3) 25 MCG (1000 UNIT) tablet Take 1,000 Units by mouth daily with supper.   . citalopram (CELEXA) 40 MG tablet Take 40 mg by mouth daily with supper.   . cyclobenzaprine (FLEXERIL) 10 MG tablet Take 10 mg by mouth 2 (two) times daily.   . ferrous sulfate 325 (65 FE) MG tablet Take 325 mg by mouth daily with breakfast.   . furosemide (LASIX) 40 MG tablet Take 1 tablet (40 mg total) by mouth daily.   Marland Kitchen gabapentin (NEURONTIN) 100 MG capsule Take 100 mg by mouth daily with breakfast.   . Ipratropium-Albuterol (COMBIVENT RESPIMAT) 20-100 MCG/ACT AERS respimat Inhale 1 puff into the lungs 3 (three) times daily as needed for wheezing or shortness of breath.   . levothyroxine (SYNTHROID) 50 MCG tablet Take 1 tablet (50 mcg total) by mouth  daily at 6 (six) AM.   . magnesium oxide (MAG-OX) 400 (241.3 Mg) MG tablet Take 1 tablet (400 mg total) by mouth 2 (two) times daily.   . Melatonin 10 MG TABS Take 30 mg by mouth at bedtime.   . nitroGLYCERIN (NITROSTAT) 0.4 MG SL tablet Place 1 tablet (0.4 mg total) under the tongue every 5 (five) minutes as needed for chest pain. For chest pain   . omeprazole (PRILOSEC) 40 MG capsule TAKE 1 CAPSULE BY MOUTH 2 TIMES DAILY.   Marland Kitchen OXYGEN Inhale 4 L/min into the lungs as needed (DURING ALL TIMES OF EXERTION).    Marland Kitchen pilocarpine (PILOCAR) 1 % ophthalmic solution Place 1 drop into both eyes 2 (two) times daily.   . potassium chloride SA (KLOR-CON) 20 MEQ tablet Take 20 mEq by mouth daily with lunch.   . pramipexole (MIRAPEX) 0.25 MG tablet Take 0.25 mg by mouth daily with supper.   . roflumilast (DALIRESP) 500 MCG TABS tablet Take 500 mcg by mouth daily with breakfast.   . spironolactone (ALDACTONE) 25 MG tablet Take 0.5 tablets (12.5 mg total) by mouth daily.   . traZODone (DESYREL) 50 MG tablet Take 150 mg by mouth at bedtime.    . vitamin C (ASCORBIC ACID) 500 MG tablet Take 500 mg by mouth daily with lunch.   Ginger Organ ER 9 MG C12A Take 9 mg by mouth 2 (two) times daily. 02/04/2021: #90/30 days  filled 01/02/2021 per bottle - pt takes twice daily   No facility-administered encounter medications on file as of 03/22/2021.    Functional Status:  In your present state of health, do you have any difficulty performing the following activities: 02/20/2021 02/04/2021  Hearing? N N  Vision? N N  Difficulty concentrating or making decisions? Y N  Comment forgets things at times -  Walking or climbing stairs? Y Y  Comment weakness at times- family assists -  Dressing or bathing? Y N  Comment granddaughter helps -  Doing errands, shopping? [name]  Comment family helps -  Conservation officer, nature and eating ? Y -  Comment family helps -  Using the Toilet? N -  In the past six months, have you accidently leaked urine? N -   Do you have problems with loss of bowel control? N -  Managing your Medications? Y -  Comment granddaughter helps -  Managing your Finances? N -  Comment family Helps -  Housekeeping or managing your Housekeeping? (No Data) -  Comment family helps -  Some recent data might be hidden    Fall/Depression Screening: Fall Risk  02/20/2021 01/10/2021 11/07/2020  Falls in the past year? 0 0 0  Comment - - -  Number falls in past yr: - - -  Injury with Fall? - - -  Risk for fall due to : - - -  Follow up - - -   PHQ 2/9 Scores 01/10/2021 11/07/2020 08/15/2020 03/30/2020 10/11/2019 10/12/2018 05/13/2018  PHQ - 2 Score 0 0 1 0 2 0 6  PHQ- 9 Score - - - - 17 - 20    Assessment:  Goals Addressed            This Visit's Progress   . COMPLETED: [facility] My Quality of Life       Timeframe:  Short-Term Goal Priority:  High Start Date:  02/20/21                           Expected End Date:   03/23/21                    Follow Up Date 03/21/21  - discuss my treatment options with the doctor or nurse - make shared treatment decisions with doctor    Why is this important?    Having a long-term illness can be scary.   It can also be stressful for you and your caregiver.   These steps may help.    Notes: 02/20/21 patient strives to maintain independence but has supportive family.    [facility] and Keep All Appointments   On track    Timeframe:  Long-Range Goal Priority:  High Start Date:   12/20/20                          Expected End Date:08/21/21              Follow Up Date 05/21/21  - arrange a ride through an agency 1 week before appointment    Why is this important?   Part of staying healthy is seeing the doctor for follow-up care.  If you forget your appointments, there are some things you can do to stay on track.    Notes: Family takes to appointments 03/22/21 patient continues to follow up with physicians as scheduled.     . THN-Track and Manage  Fluids and Swelling        Timeframe:  Long-Range Goal Priority:  High Start Date:   12/20/20                          Expected End Date: 08/21/21           Follow Up Date 03/21/21   - use salt in moderation - watch for swelling in feet, ankles and legs every day    Why is this important?   It is important to check your weight daily and watch how much salt and liquids you have.  It will help you to manage your heart failure.    Notes: Patient weighs daily. 12/20/20 173 lbs 01/10/21- 171 lbs. 02/20/21 patient weight 165 lbs  03/22/21 patient weight today 170 lbs but patient reports weight up to 176 lbs earlier this week and MD increased her lasix. Patient states she has to call to report her weight today.  Reiterated the importance of a low salt diet.         Plan: RN CM will follow up with patient later this month.  Follow-up:  Patient agrees to Care Plan and Follow-up.   [name], RN, MSN [facility] Care Management Coordinator Direct Line [phone] Cell [phone] Toll Free: [phone]  Fax: [phone]

## 2021-03-25 DIAGNOSIS — J449 Chronic obstructive pulmonary disease, unspecified: Secondary | ICD-10-CM | POA: Diagnosis not present

## 2021-03-27 DIAGNOSIS — J439 Emphysema, unspecified: Secondary | ICD-10-CM | POA: Diagnosis not present

## 2021-03-27 DIAGNOSIS — I5033 Acute on chronic diastolic (congestive) heart failure: Secondary | ICD-10-CM | POA: Diagnosis not present

## 2021-03-27 DIAGNOSIS — I110 Hypertensive heart disease with heart failure: Secondary | ICD-10-CM | POA: Diagnosis not present

## 2021-03-27 DIAGNOSIS — I82403 Acute embolism and thrombosis of unspecified deep veins of lower extremity, bilateral: Secondary | ICD-10-CM | POA: Diagnosis not present

## 2021-03-27 DIAGNOSIS — I25810 Atherosclerosis of coronary artery bypass graft(s) without angina pectoris: Secondary | ICD-10-CM | POA: Diagnosis not present

## 2021-03-27 DIAGNOSIS — M545 Low back pain, unspecified: Secondary | ICD-10-CM | POA: Diagnosis not present

## 2021-03-27 DIAGNOSIS — I480 Paroxysmal atrial fibrillation: Secondary | ICD-10-CM | POA: Diagnosis not present

## 2021-03-27 DIAGNOSIS — I2699 Other pulmonary embolism without acute cor pulmonale: Secondary | ICD-10-CM | POA: Diagnosis not present

## 2021-03-27 DIAGNOSIS — I25118 Atherosclerotic heart disease of native coronary artery with other forms of angina pectoris: Secondary | ICD-10-CM | POA: Diagnosis not present

## 2021-03-28 DIAGNOSIS — J449 Chronic obstructive pulmonary disease, unspecified: Secondary | ICD-10-CM | POA: Diagnosis not present

## 2021-03-28 NOTE — Telephone Encounter (Signed)
Left message for pt to call back  °

## 2021-04-01 DIAGNOSIS — M545 Low back pain, unspecified: Secondary | ICD-10-CM | POA: Diagnosis not present

## 2021-04-01 DIAGNOSIS — I25118 Atherosclerotic heart disease of native coronary artery with other forms of angina pectoris: Secondary | ICD-10-CM | POA: Diagnosis not present

## 2021-04-01 DIAGNOSIS — I82403 Acute embolism and thrombosis of unspecified deep veins of lower extremity, bilateral: Secondary | ICD-10-CM | POA: Diagnosis not present

## 2021-04-01 DIAGNOSIS — I5033 Acute on chronic diastolic (congestive) heart failure: Secondary | ICD-10-CM | POA: Diagnosis not present

## 2021-04-01 DIAGNOSIS — J439 Emphysema, unspecified: Secondary | ICD-10-CM | POA: Diagnosis not present

## 2021-04-01 DIAGNOSIS — I480 Paroxysmal atrial fibrillation: Secondary | ICD-10-CM | POA: Diagnosis not present

## 2021-04-01 DIAGNOSIS — I2699 Other pulmonary embolism without acute cor pulmonale: Secondary | ICD-10-CM | POA: Diagnosis not present

## 2021-04-01 DIAGNOSIS — I110 Hypertensive heart disease with heart failure: Secondary | ICD-10-CM | POA: Diagnosis not present

## 2021-04-01 DIAGNOSIS — I25810 Atherosclerosis of coronary artery bypass graft(s) without angina pectoris: Secondary | ICD-10-CM | POA: Diagnosis not present

## 2021-04-02 NOTE — Telephone Encounter (Signed)
This is the second note   Please disregard   I saw the last phone note  BNP was up   BUt if she is feeling great I would keep as is/

## 2021-04-02 NOTE — Telephone Encounter (Signed)
Because her ankles and legs were swollen "significantly", and she was at an annual visit with her PCP so he increased her lasix to 40 mg BID and also gave metolazone daily for one week.   Her granddaughter did not realize it was to be 30 min prior to lasix so it was given at the same time as am lasix.  Swelling is much improved.  Her ankles look "great".  She is aware we will let Dr. Harrington Challenger know and if she wishes to make any further changes to medicines we will call her back.    She has follow up scheduled with APP 04/26/21.  Requested labs be done at that time, BNP, BMET.

## 2021-04-02 NOTE — Telephone Encounter (Signed)
Patient's granddaughter calling back for lab results and medication changes. She states she is the one who handles the patient's medications, because the patient gets confused.

## 2021-04-03 DIAGNOSIS — M818 Other osteoporosis without current pathological fracture: Secondary | ICD-10-CM | POA: Diagnosis not present

## 2021-04-08 DIAGNOSIS — J449 Chronic obstructive pulmonary disease, unspecified: Secondary | ICD-10-CM | POA: Diagnosis not present

## 2021-04-09 DIAGNOSIS — I25810 Atherosclerosis of coronary artery bypass graft(s) without angina pectoris: Secondary | ICD-10-CM | POA: Diagnosis not present

## 2021-04-09 DIAGNOSIS — M545 Low back pain, unspecified: Secondary | ICD-10-CM | POA: Diagnosis not present

## 2021-04-09 DIAGNOSIS — I25118 Atherosclerotic heart disease of native coronary artery with other forms of angina pectoris: Secondary | ICD-10-CM | POA: Diagnosis not present

## 2021-04-09 DIAGNOSIS — I5033 Acute on chronic diastolic (congestive) heart failure: Secondary | ICD-10-CM | POA: Diagnosis not present

## 2021-04-09 DIAGNOSIS — I82403 Acute embolism and thrombosis of unspecified deep veins of lower extremity, bilateral: Secondary | ICD-10-CM | POA: Diagnosis not present

## 2021-04-09 DIAGNOSIS — I110 Hypertensive heart disease with heart failure: Secondary | ICD-10-CM | POA: Diagnosis not present

## 2021-04-09 DIAGNOSIS — J439 Emphysema, unspecified: Secondary | ICD-10-CM | POA: Diagnosis not present

## 2021-04-09 DIAGNOSIS — I480 Paroxysmal atrial fibrillation: Secondary | ICD-10-CM | POA: Diagnosis not present

## 2021-04-09 DIAGNOSIS — I2699 Other pulmonary embolism without acute cor pulmonale: Secondary | ICD-10-CM | POA: Diagnosis not present

## 2021-04-15 ENCOUNTER — Other Ambulatory Visit: Payer: Self-pay

## 2021-04-15 NOTE — Patient Outreach (Signed)
[facility]) Care Management  [place]  04/15/2021   [name] 09/10/1941 [id number]  Subjective: Telephone call to patient for follow up. Patient reports that she is doing pretty good. Patient reports weight is 171 lbs today. Discussed heart failure management and weight control. She verbalized understanding. Patient has follow up with cardiologist next week.   Objective:   Encounter Medications:  Outpatient Encounter Medications as of 04/15/2021  Medication Sig Note  . allopurinol (ZYLOPRIM) 100 MG tablet Take 100 mg by mouth daily with breakfast.   . ARIPiprazole (ABILIFY) 2 MG tablet Take 1 mg by mouth daily with breakfast.   . aspirin EC 81 MG tablet Take 81 mg by mouth daily with breakfast. Swallow whole.   . Aspirin-Salicylamide-Caffeine (BC HEADACHE POWDER PO) Take 1 packet by mouth 2 (two) times daily as needed (severe headache).   Marland Kitchen atorvastatin (LIPITOR) 20 MG tablet Take 20 mg by mouth daily with supper.   . cholecalciferol (VITAMIN D3) 25 MCG (1000 UNIT) tablet Take 1,000 Units by mouth daily with supper.   . citalopram (CELEXA) 40 MG tablet Take 40 mg by mouth daily with supper.   . cyclobenzaprine (FLEXERIL) 10 MG tablet Take 10 mg by mouth 2 (two) times daily.   . ferrous sulfate 325 (65 FE) MG tablet Take 325 mg by mouth daily with breakfast.   . furosemide (LASIX) 40 MG tablet Take 1 tablet (40 mg total) by mouth daily. (Patient taking differently: Take 40 mg by mouth daily.)   . gabapentin (NEURONTIN) 100 MG capsule Take 100 mg by mouth daily with breakfast.   . Ipratropium-Albuterol (COMBIVENT RESPIMAT) 20-100 MCG/ACT AERS respimat Inhale 1 puff into the lungs 3 (three) times daily as needed for wheezing or shortness of breath.   . levothyroxine (SYNTHROID) 50 MCG tablet Take 1 tablet (50 mcg total) by mouth daily at 6 (six) AM.   . magnesium oxide (MAG-OX) 400 (241.3 Mg) MG tablet Take 1 tablet (400 mg total) by mouth 2 (two) times  daily.   . Melatonin 10 MG TABS Take 30 mg by mouth at bedtime.   . nitroGLYCERIN (NITROSTAT) 0.4 MG SL tablet Place 1 tablet (0.4 mg total) under the tongue every 5 (five) minutes as needed for chest pain. For chest pain   . omeprazole (PRILOSEC) 40 MG capsule TAKE 1 CAPSULE BY MOUTH 2 TIMES DAILY.   Marland Kitchen OXYGEN Inhale 4 L/min into the lungs as needed (DURING ALL TIMES OF EXERTION).    Marland Kitchen pilocarpine (PILOCAR) 1 % ophthalmic solution Place 1 drop into both eyes 2 (two) times daily.   . potassium chloride SA (KLOR-CON) 20 MEQ tablet Take 20 mEq by mouth daily with lunch.   . pramipexole (MIRAPEX) 0.25 MG tablet Take 0.25 mg by mouth daily with supper.   . roflumilast (DALIRESP) 500 MCG TABS tablet Take 500 mcg by mouth daily with breakfast.   . spironolactone (ALDACTONE) 25 MG tablet Take 0.5 tablets (12.5 mg total) by mouth daily.   . traZODone (DESYREL) 50 MG tablet Take 150 mg by mouth at bedtime.    . vitamin C (ASCORBIC ACID) 500 MG tablet Take 500 mg by mouth daily with lunch.   Ginger Organ ER 9 MG C12A Take 9 mg by mouth 2 (two) times daily. 02/04/2021: #90/30 days filled 01/02/2021 per bottle - pt takes twice daily   No facility-administered encounter medications on file as of 04/15/2021.    Functional Status:  In your present state of  health, do you have any difficulty performing the following activities: 02/20/2021 02/04/2021  Hearing? N N  Vision? N N  Difficulty concentrating or making decisions? Y N  Comment forgets things at times -  Walking or climbing stairs? Y Y  Comment weakness at times- family assists -  Dressing or bathing? Y N  Comment granddaughter helps -  Doing errands, shopping? [name]  Comment family helps -  Conservation officer, nature and eating ? Y -  Comment family helps -  Using the Toilet? N -  In the past six months, have you accidently leaked urine? N -  Do you have problems with loss of bowel control? N -  Managing your Medications? Y -  Comment granddaughter helps -   Managing your Finances? N -  Comment family Helps -  Housekeeping or managing your Housekeeping? (No Data) -  Comment family helps -  Some recent data might be hidden    Fall/Depression Screening: Fall Risk  02/20/2021 01/10/2021 11/07/2020  Falls in the past year? 0 0 0  Comment - - -  Number falls in past yr: - - -  Injury with Fall? - - -  Risk for fall due to : - - -  Follow up - - -   PHQ 2/9 Scores 01/10/2021 11/07/2020 08/15/2020 03/30/2020 10/11/2019 10/12/2018 05/13/2018  PHQ - 2 Score 0 0 1 0 2 0 6  PHQ- 9 Score - - - - 17 - 20    Assessment:  Goals Addressed            This Visit's Progress   . THN-Track and Manage Fluids and Swelling   On track    Timeframe:  Long-Range Goal Priority:  High Start Date:   12/20/20                          Expected End Date: 08/21/21           Follow Up Date 03/21/21   - use salt in moderation - watch for swelling in feet, ankles and legs every day    Why is this important?   It is important to check your weight daily and watch how much salt and liquids you have.  It will help you to manage your heart failure.    Notes: Patient weighs daily. 12/20/20 173 lbs 01/10/21- 171 lbs. 02/20/21 patient weight 165 lbs  03/22/21 patient weight today 170 lbs but patient reports weight up to 176 lbs earlier this week and MD increased her lasix. Patient states she has to call to report her weight today.  Reiterated the importance of a low salt diet.         Plan: RN CM will follow up with patient next month.   Follow-up:  Patient agrees to Care Plan and Follow-up.   [name], RN, MSN [facility] Care Management Coordinator Direct Line [phone] Cell [phone] Toll Free: [phone]  Fax: [phone]

## 2021-04-15 NOTE — Patient Instructions (Signed)
Goals Addressed            This Visit's Progress   . THN-Track and Manage Fluids and Swelling   On track    Timeframe:  Long-Range Goal Priority:  High Start Date:   12/20/20                          Expected End Date: 08/21/21           Follow Up Date 03/21/21   - use salt in moderation - watch for swelling in feet, ankles and legs every day    Why is this important?   It is important to check your weight daily and watch how much salt and liquids you have.  It will help you to manage your heart failure.    Notes: Patient weighs daily. 12/20/20 173 lbs 01/10/21- 171 lbs. 02/20/21 patient weight 165 lbs  03/22/21 patient weight today 170 lbs but patient reports weight up to 176 lbs earlier this week and MD increased her lasix. Patient states she has to call to report her weight today.  Reiterated the importance of a low salt diet.

## 2021-04-17 ENCOUNTER — Ambulatory Visit: Payer: Self-pay

## 2021-04-23 ENCOUNTER — Other Ambulatory Visit: Payer: Medicare HMO

## 2021-04-24 DIAGNOSIS — J449 Chronic obstructive pulmonary disease, unspecified: Secondary | ICD-10-CM | POA: Diagnosis not present

## 2021-04-25 NOTE — Progress Notes (Deleted)
Cardiology Office Note:    Date:  04/25/2021   ID:  [name], [place] 11/25/1941, MRN [id number]  PCP:  [name], [place] Prov[facility] Cardiologist:  Dorris Carnes, MD { Click to update primary MD,subspecialty MD or APP then REFRESH:1}    Referring MD: [name], MD   Chief Complaint:  No chief complaint on file.    Patient Profile:    [name] is a 80 y.o. female with:   Coronary artery disease  ? S/p CABG 2004 ? S/p DES to LM in 2010 ? Myoview 9/18: dist ant and apical ischemia; low risk  (HFpEF) heart failure with preserved ejection fraction  ? Last admit in 09/2019  Carotid artery disease  [place] 7/21: Bilateral 40-59  Recurrent DVT/pulmonary embolism ? anticoagulationDC'd in past due to GI bleed ? admx in 3/19 with multiple bilat PEs with L femoral DVT ? anticoagulationc/b large abd wall hemotoma >> s/p tx with PRBCs ? S/p IVC filter  PAD ? S/p SMA stenting in 2019 by [facility]  Hypertension   Chronic kidney disease   COPD  Admitted 10/21 with pneumonia/sepsis  Re-adm 11/21 w hypotension, AKI; diuretics held then restarted  Cirrhosis   Prior CV studies:  VAS US CAROTID DUPLEX BILATERAL 07/13/2020 Bilat ICA 40-59 Bilateral subclavian stenosis  Echocardiogram 10/05/2019 EF 55-60, mild LVH, pseudonormalization (grade 2 diastolic dysfunction), moderate BAE, moderate MAC, mild MR, trivial TR, RVSP 34.5  Mesenteric vascular US 06/30/2018 Mesenteric: Normal Celiac artery , Splenic artery, Hepatic artery and Inferior Mesenteric artery findings. 70 to 99% stenosis in the superior mesenteric artery.  Abdominal and chest CTA 04/01/2018 IMPRESSION: 1. No evidence of pulmonary embolism. Interval resolution of previously seen left lower lobe pulmonary emboli. 2. Unchanged moderate right and trace left pleural effusions with adjacent bibasilar atelectasis. 3. Relatively unchanged large anterior abdominal wall  hematoma involving the space of Retzius, measuring 11.0 x 16.7 x 12.6 cm. 4. Aortic atherosclerosis (ICD10-I70.0).  Myoview 08/31/17  Nuclear stress EF: 65%.  Small region of distal anterior and apical ischemia, cannot exclude shifting breast attenuation. . Small anteroseptal defect consistent with probable soft tissue attenuation  This is a low risk study.   History of Present Illness: [name] was admitted in 2/22 with decompensated heart failure.  She followed up in the CHF [facility] clinic.  ReDs vest reading was normal at 28%.  She was last seen by Dr. Harrington Challenger in 3/22.  ***        Past Medical History:  Diagnosis Date  . Anemia    bld. transfusion post lumbar surgery- 2012  . Anxiety   . Arthralgia    NOS  . Blood transfusion 2012; 02/2018   WITH BACK SURGERY; "related to hematoma in stomach" (10/06/2018)  . CAD (coronary artery disease)    s/p CABG 2004; s/p DES to LM in 2010;  [place] 10/29/11: EF 50-55%, mild elevated filling pressures, no pulmonary HTN, LM 90% ISR, LAD and CFX occluded, S-RI occluded (old), S-OM3 ok and L-LAD ok, native nondominant RCA 95% -  med rx recommended ; Lexiscan Myoview 7/13 at [facility]: demonstrated "normal LV function, anterior attenuation and localized ischemia, inferior, basilar, mid section"  . Carotid artery disease ([place])    Carotid US 5/80:  RICA 9-98; LICA 33-82; R subclavian stenosis - Repeat 1 year. // Carotid US 05/2019: R 1-39; L 40-59; R vertebral with atypical antegrade flow; R subclavian stenosis >> repeat 1 year // Carotid US 7/21: Bilat  40-59; L subclavian stenosis   . CHF (congestive heart failure) ([place]) 01/21/2020  . Chronic diastolic heart failure (HCC)    Echo 9/10: EF 74-08%, grade 1 diastolic dysfunction  . Chronic lower back pain   . COPD (chronic obstructive pulmonary disease) ([place])    Emphysema dxed by Dr. Woody Seller in [place] based on PFTs per pt in 2006; placed on albuterol  . Depression    TAKES CELEXA  AND  (OFF- WELBUTRIN)  .  Diuretic-induced hypokalemia 10/08/2018  . DVT of lower extremity (deep venous thrombosis) (HCC)    recurrent. bilateral (2 episodes)  . Dyspnea    home o2 when needed  . Dysrhythmia    afib with cabg  . Exertional angina (HCC)    Treated with Isosorbide, Ranexa, amlodipine; intolerant to metoprolol  . GERD (gastroesophageal reflux disease)   . Gout    "on daily RX" (10/06/2018)  . HLD (hyperlipidemia)   . Hypertension   . Hyperthyroidism   . L Subclavian Artery Stenosis    Carotid US 7/21   . Obesity (BMI 30-39.9) 2009   BMI 33  . Osteoarthritis    "all over; hands" (10/06/2018)  . Oxygen deficiency   . Oxygen dependent    4L at home as needed (10/06/2018)  . Pneumonia   . PVD (peripheral vascular disease) ([place])   . Sleep apnea 2012   USED CPAP THEN  STARTED USING SPIRIVA , Nov. 2013- last evaluation , changed from mask to aparatus that is just for her nose.. ; reports 05-28-18 "the mask smothers me so i dont use it right now" (10/06/2018)    Current Medications: No outpatient medications have been marked as taking for the 04/26/21 encounter (Appointment) with [name] T, PA-C.     Allergies:   Ativan [lorazepam], Pitavastatin, Ropinirole, Zofran [ondansetron hcl], Zolpidem tartrate, and Penicillins   Social History   Tobacco Use  . Smoking status: Former Smoker    Packs/day: 1.00    Years: 45.00    Pack years: 45.00    Types: Cigarettes    Quit date: 12/22/2000    Years since quitting: 20.3  . Smokeless tobacco: Never Used  Vaping Use  . Vaping Use: Never used  Substance Use Topics  . Alcohol use: Never    Alcohol/week: 0.0 standard drinks  . Drug use: Never     Family Hx: The patient's family history includes COPD in her father; Colitis in her sister; Heart disease in her father and sister; Lung cancer in her father; Ovarian cancer in her mother; Stomach cancer in her sister.  ROS   EKGs/Labs/Other Test Reviewed:    EKG:  EKG is *** ordered today.  The  ekg ordered today demonstrates ***  Recent Labs: 09/10/2020: TSH 4.060 10/13/2020: Magnesium 2.1 02/04/2021: ALT 12; B Natriuretic Peptide 345.4 02/05/2021: Hemoglobin 9.3; Platelets 178 03/19/2021: BUN 19; Creatinine, Ser 1.06; NT-Pro BNP 1,355; Potassium 4.4; Sodium 144   Recent Lipid Panel Lab Results  Component Value Date/Time   CHOL 89 (L) 03/08/2020 11:58 AM   CHOL 98 05/23/2014 08:57 AM   TRIG 76 03/08/2020 11:58 AM   TRIG 83 05/23/2014 08:57 AM   HDL 37 (L) 03/08/2020 11:58 AM   HDL 35 (L) 05/23/2014 08:57 AM   CHOLHDL 2.4 03/08/2020 11:58 AM   CHOLHDL 4 03/03/2013 11:36 AM   LDLCALC 36 03/08/2020 11:58 AM   LDLCALC 46 05/23/2014 08:57 AM      Risk Assessment/Calculations:   {Does this patient have ATRIAL FIBRILLATION?[phone]}  Physical Exam:    VS:  There were no vitals taken for this visit.    Wt Readings from Last 3 Encounters:  02/21/21 170 lb 12.8 oz (77.5 kg)  02/12/21 167 lb (75.8 kg)  02/07/21 165 lb 6.4 oz (75 kg)     Physical Exam ***     ASSESSMENT & PLAN:    *** 1. Chronic diastolic heart failure (HCC) Overall, her volume status seems to be stable.  Her weight is up 8 pounds by our scales.  Her weights at home have been fairly stable.  However, she does feel that she is caring more fluid in her abdomen.  I have recommended increasing her furosemide to 80 mg twice a day for 2 days.  She will then resume 80 mg in the morning and 40 mg in the afternoon.  She continues to take metolazone twice a week.  Her next dose is tomorrow.  Most recent creatinine was stable.  If she continues to have issues with managing volume, we should consider changing her furosemide to torsemide.  She knows to contact us if her fluid status does not improve in the next few days.  She can follow-up with Dr. Harrington Challenger or me in 3 months.  2. Coronary artery disease involving native coronary artery of native heart with angina pectoris ([place]) History of CABG in 2004 and drug-eluting  stent to the left main in 2010.  Myoview in 2018 was low risk.  She is not having anginal symptoms.  Continue aspirin, atorvastatin.  3. Essential hypertension The patient's blood pressure is controlled on her current regimen.  Continue current therapy.   4. Mechanical dysphagia She describes issues with dysphagia as well as odynophagia and acid reflux.  She is already on omeprazole 40 mg daily.  I have recommended that she increase this to twice daily.  I have also recommended that we refer her to gastroenterology for further evaluation and management.  {Are you ordering a CV Procedure (e.g. stress test, cath, DCCV, TEE, etc)?   Press F2        :[id number]}    Dispo:  No follow-ups on file.   Medication Adjustments/Labs and Tests Ordered: Current medicines are reviewed at length with the patient today.  Concerns regarding medicines are outlined above.  Tests Ordered: No orders of the defined types were placed in this encounter.  Medication Changes: No orders of the defined types were placed in this encounter.   Signed, [name], PA-C  04/25/2021 10:13 PM    [facility] [place], [place], [place]  [address] Phone: [phone]; Fax: [phone]

## 2021-04-26 ENCOUNTER — Ambulatory Visit: Payer: Medicare HMO | Admitting: Physician Assistant

## 2021-04-29 ENCOUNTER — Encounter (HOSPITAL_COMMUNITY): Payer: Self-pay

## 2021-04-29 ENCOUNTER — Inpatient Hospital Stay (HOSPITAL_COMMUNITY)
Admission: EM | Admit: 2021-04-29 | Discharge: 2021-05-04 | DRG: 871 | Disposition: A | Discharge status: Home Health | Payer: Medicare HMO | Attending: Internal Medicine | Admitting: Internal Medicine

## 2021-04-29 ENCOUNTER — Other Ambulatory Visit: Payer: Self-pay

## 2021-04-29 ENCOUNTER — Telehealth: Payer: Self-pay | Admitting: Internal Medicine

## 2021-04-29 ENCOUNTER — Emergency Department (HOSPITAL_COMMUNITY): Payer: Medicare HMO

## 2021-04-29 ENCOUNTER — Ambulatory Visit: Payer: Medicare HMO | Admitting: Physician Assistant

## 2021-04-29 DIAGNOSIS — Z20822 Contact with and (suspected) exposure to covid-19: Secondary | ICD-10-CM | POA: Diagnosis present

## 2021-04-29 DIAGNOSIS — I5032 Chronic diastolic (congestive) heart failure: Secondary | ICD-10-CM | POA: Diagnosis not present

## 2021-04-29 DIAGNOSIS — I10 Essential (primary) hypertension: Secondary | ICD-10-CM | POA: Diagnosis not present

## 2021-04-29 DIAGNOSIS — R069 Unspecified abnormalities of breathing: Secondary | ICD-10-CM | POA: Diagnosis not present

## 2021-04-29 DIAGNOSIS — Z9981 Dependence on supplemental oxygen: Secondary | ICD-10-CM | POA: Diagnosis not present

## 2021-04-29 DIAGNOSIS — E785 Hyperlipidemia, unspecified: Secondary | ICD-10-CM | POA: Diagnosis present

## 2021-04-29 DIAGNOSIS — Z96653 Presence of artificial knee joint, bilateral: Secondary | ICD-10-CM | POA: Diagnosis present

## 2021-04-29 DIAGNOSIS — E039 Hypothyroidism, unspecified: Secondary | ICD-10-CM | POA: Diagnosis present

## 2021-04-29 DIAGNOSIS — G2581 Restless legs syndrome: Secondary | ICD-10-CM

## 2021-04-29 DIAGNOSIS — J9621 Acute and chronic respiratory failure with hypoxia: Secondary | ICD-10-CM | POA: Diagnosis present

## 2021-04-29 DIAGNOSIS — I110 Hypertensive heart disease with heart failure: Secondary | ICD-10-CM | POA: Diagnosis present

## 2021-04-29 DIAGNOSIS — I2510 Atherosclerotic heart disease of native coronary artery without angina pectoris: Secondary | ICD-10-CM | POA: Diagnosis present

## 2021-04-29 DIAGNOSIS — R0602 Shortness of breath: Secondary | ICD-10-CM

## 2021-04-29 DIAGNOSIS — R079 Chest pain, unspecified: Secondary | ICD-10-CM | POA: Diagnosis not present

## 2021-04-29 DIAGNOSIS — M1A9XX0 Chronic gout, unspecified, without tophus (tophi): Secondary | ICD-10-CM | POA: Diagnosis present

## 2021-04-29 DIAGNOSIS — Z79899 Other long term (current) drug therapy: Secondary | ICD-10-CM

## 2021-04-29 DIAGNOSIS — Z6833 Body mass index (BMI) 33.0-33.9, adult: Secondary | ICD-10-CM

## 2021-04-29 DIAGNOSIS — Z7982 Long term (current) use of aspirin: Secondary | ICD-10-CM

## 2021-04-29 DIAGNOSIS — M545 Low back pain, unspecified: Secondary | ICD-10-CM | POA: Diagnosis not present

## 2021-04-29 DIAGNOSIS — G8929 Other chronic pain: Secondary | ICD-10-CM | POA: Diagnosis present

## 2021-04-29 DIAGNOSIS — E669 Obesity, unspecified: Secondary | ICD-10-CM | POA: Diagnosis present

## 2021-04-29 DIAGNOSIS — Z66 Do not resuscitate: Secondary | ICD-10-CM | POA: Diagnosis present

## 2021-04-29 DIAGNOSIS — Z825 Family history of asthma and other chronic lower respiratory diseases: Secondary | ICD-10-CM

## 2021-04-29 DIAGNOSIS — I509 Heart failure, unspecified: Secondary | ICD-10-CM

## 2021-04-29 DIAGNOSIS — F32A Depression, unspecified: Secondary | ICD-10-CM | POA: Diagnosis present

## 2021-04-29 DIAGNOSIS — J441 Chronic obstructive pulmonary disease with (acute) exacerbation: Secondary | ICD-10-CM | POA: Diagnosis present

## 2021-04-29 DIAGNOSIS — Z86711 Personal history of pulmonary embolism: Secondary | ICD-10-CM | POA: Diagnosis not present

## 2021-04-29 DIAGNOSIS — I739 Peripheral vascular disease, unspecified: Secondary | ICD-10-CM | POA: Diagnosis present

## 2021-04-29 DIAGNOSIS — I872 Venous insufficiency (chronic) (peripheral): Secondary | ICD-10-CM | POA: Diagnosis present

## 2021-04-29 DIAGNOSIS — R0603 Acute respiratory distress: Secondary | ICD-10-CM | POA: Diagnosis present

## 2021-04-29 DIAGNOSIS — R9431 Abnormal electrocardiogram [ECG] [EKG]: Secondary | ICD-10-CM | POA: Diagnosis not present

## 2021-04-29 DIAGNOSIS — Z880 Allergy status to penicillin: Secondary | ICD-10-CM

## 2021-04-29 DIAGNOSIS — Z981 Arthrodesis status: Secondary | ICD-10-CM | POA: Diagnosis not present

## 2021-04-29 DIAGNOSIS — Z96643 Presence of artificial hip joint, bilateral: Secondary | ICD-10-CM | POA: Diagnosis present

## 2021-04-29 DIAGNOSIS — Z79891 Long term (current) use of opiate analgesic: Secondary | ICD-10-CM

## 2021-04-29 DIAGNOSIS — I5033 Acute on chronic diastolic (congestive) heart failure: Secondary | ICD-10-CM | POA: Diagnosis present

## 2021-04-29 DIAGNOSIS — Z951 Presence of aortocoronary bypass graft: Secondary | ICD-10-CM | POA: Diagnosis not present

## 2021-04-29 DIAGNOSIS — I959 Hypotension, unspecified: Secondary | ICD-10-CM | POA: Diagnosis not present

## 2021-04-29 DIAGNOSIS — Z955 Presence of coronary angioplasty implant and graft: Secondary | ICD-10-CM

## 2021-04-29 DIAGNOSIS — Z87891 Personal history of nicotine dependence: Secondary | ICD-10-CM

## 2021-04-29 DIAGNOSIS — R062 Wheezing: Secondary | ICD-10-CM | POA: Diagnosis not present

## 2021-04-29 DIAGNOSIS — Z96611 Presence of right artificial shoulder joint: Secondary | ICD-10-CM | POA: Diagnosis present

## 2021-04-29 DIAGNOSIS — A419 Sepsis, unspecified organism: Principal | ICD-10-CM | POA: Diagnosis present

## 2021-04-29 DIAGNOSIS — F419 Anxiety disorder, unspecified: Secondary | ICD-10-CM | POA: Diagnosis present

## 2021-04-29 DIAGNOSIS — I4891 Unspecified atrial fibrillation: Secondary | ICD-10-CM | POA: Diagnosis not present

## 2021-04-29 DIAGNOSIS — Z86718 Personal history of other venous thrombosis and embolism: Secondary | ICD-10-CM | POA: Diagnosis not present

## 2021-04-29 DIAGNOSIS — Z79890 Hormone replacement therapy (postmenopausal): Secondary | ICD-10-CM

## 2021-04-29 DIAGNOSIS — K219 Gastro-esophageal reflux disease without esophagitis: Secondary | ICD-10-CM | POA: Diagnosis present

## 2021-04-29 DIAGNOSIS — I25810 Atherosclerosis of coronary artery bypass graft(s) without angina pectoris: Secondary | ICD-10-CM | POA: Diagnosis present

## 2021-04-29 DIAGNOSIS — Z888 Allergy status to other drugs, medicaments and biological substances status: Secondary | ICD-10-CM

## 2021-04-29 DIAGNOSIS — Z9049 Acquired absence of other specified parts of digestive tract: Secondary | ICD-10-CM | POA: Diagnosis not present

## 2021-04-29 DIAGNOSIS — M1A09X0 Idiopathic chronic gout, multiple sites, without tophus (tophi): Secondary | ICD-10-CM | POA: Diagnosis not present

## 2021-04-29 DIAGNOSIS — G4733 Obstructive sleep apnea (adult) (pediatric): Secondary | ICD-10-CM | POA: Diagnosis present

## 2021-04-29 DIAGNOSIS — J449 Chronic obstructive pulmonary disease, unspecified: Secondary | ICD-10-CM | POA: Diagnosis not present

## 2021-04-29 DIAGNOSIS — R000 Tachycardia, unspecified: Secondary | ICD-10-CM | POA: Diagnosis not present

## 2021-04-29 LAB — CBC WITH DIFFERENTIAL/PLATELET
Abs Immature Granulocytes: 0.04 10*3/uL (ref 0.00–0.07)
Basophils Absolute: 0.1 10*3/uL (ref 0.0–0.1)
Basophils Relative: 1 %
Eosinophils Absolute: 0 10*3/uL (ref 0.0–0.5)
Eosinophils Relative: 1 %
HCT: 38.7 % (ref 36.0–46.0)
Hemoglobin: 11.4 g/dL — ABNORMAL LOW (ref 12.0–15.0)
Immature Granulocytes: 1 %
Lymphocytes Relative: 12 %
Lymphs Abs: 0.9 10*3/uL (ref 0.7–4.0)
MCH: 27.1 pg (ref 26.0–34.0)
MCHC: 29.5 g/dL — ABNORMAL LOW (ref 30.0–36.0)
MCV: 92.1 fL (ref 80.0–100.0)
Monocytes Absolute: 0.7 10*3/uL (ref 0.1–1.0)
Monocytes Relative: 10 %
Neutro Abs: 5.4 10*3/uL (ref 1.7–7.7)
Neutrophils Relative %: 75 %
Platelets: 207 10*3/uL (ref 150–400)
RBC: 4.2 MIL/uL (ref 3.87–5.11)
RDW: 16.1 % — ABNORMAL HIGH (ref 11.5–15.5)
WBC: 7.1 10*3/uL (ref 4.0–10.5)
nRBC: 0 % (ref 0.0–0.2)

## 2021-04-29 LAB — RESP PANEL BY RT-PCR (FLU A&B, COVID) ARPGX2
Influenza A by PCR: NEGATIVE
Influenza B by PCR: NEGATIVE
SARS Coronavirus 2 by RT PCR: NEGATIVE

## 2021-04-29 LAB — COMPREHENSIVE METABOLIC PANEL
ALT: 12 U/L (ref 0–44)
AST: 19 U/L (ref 15–41)
Albumin: 3.5 g/dL (ref 3.5–5.0)
Alkaline Phosphatase: 64 U/L (ref 38–126)
Anion gap: 6 (ref 5–15)
BUN: 14 mg/dL (ref 8–23)
CO2: 32 mmol/L (ref 22–32)
Calcium: 8.3 mg/dL — ABNORMAL LOW (ref 8.9–10.3)
Chloride: 100 mmol/L (ref 98–111)
Creatinine, Ser: 1.1 mg/dL — ABNORMAL HIGH (ref 0.44–1.00)
GFR, Estimated: 51 mL/min — ABNORMAL LOW (ref >60)
Glucose, Bld: 124 mg/dL — ABNORMAL HIGH (ref 70–99)
Potassium: 4.5 mmol/L (ref 3.5–5.1)
Sodium: 138 mmol/L (ref 135–145)
Total Bilirubin: 0.5 mg/dL (ref 0.3–1.2)
Total Protein: 6.7 g/dL (ref 6.5–8.1)

## 2021-04-29 LAB — LACTIC ACID, PLASMA: Lactic Acid, Venous: 1.8 mmol/L (ref 0.5–1.9)

## 2021-04-29 LAB — APTT: aPTT: 26 seconds (ref 24–36)

## 2021-04-29 LAB — PROTIME-INR
INR: 1.1 (ref 0.8–1.2)
Prothrombin Time: 14.2 seconds (ref 11.4–15.2)

## 2021-04-29 MED ORDER — ARIPIPRAZOLE 2 MG PO TABS
1.0000 mg | ORAL_TABLET | Freq: Every day | ORAL | Status: DC
  Administered 2021-04-30 – 2021-05-04 (×5): 1 mg via ORAL
  Filled 2021-04-29 (×5): qty 1

## 2021-04-29 MED ORDER — ALBUTEROL SULFATE HFA 108 (90 BASE) MCG/ACT IN AERS
INHALATION_SPRAY | RESPIRATORY_TRACT | Status: AC
  Filled 2021-04-29: qty 6.7

## 2021-04-29 MED ORDER — FUROSEMIDE 40 MG PO TABS
40.0000 mg | ORAL_TABLET | Freq: Every day | ORAL | Status: DC
  Administered 2021-04-30: 40 mg via ORAL
  Filled 2021-04-29: qty 1

## 2021-04-29 MED ORDER — LEVOFLOXACIN IN D5W 750 MG/150ML IV SOLN: 750.0000 mg | Freq: Once | INTRAVENOUS | Status: DC

## 2021-04-29 MED ORDER — CITALOPRAM HYDROBROMIDE 10 MG PO TABS: 40.0000 mg | ORAL_TABLET | Freq: Every day | ORAL | Status: DC

## 2021-04-29 MED ORDER — DOXYCYCLINE HYCLATE 100 MG PO TABS: 100.0000 mg | ORAL_TABLET | Freq: Once | ORAL | Status: DC

## 2021-04-29 MED ORDER — ACETAMINOPHEN 325 MG PO TABS
650.0000 mg | ORAL_TABLET | Freq: Four times a day (QID) | ORAL | Status: DC | PRN
  Administered 2021-05-01 – 2021-05-03 (×3): 650 mg via ORAL
  Filled 2021-04-29 (×3): qty 2

## 2021-04-29 MED ORDER — FERROUS SULFATE 325 (65 FE) MG PO TABS
325.0000 mg | ORAL_TABLET | Freq: Every day | ORAL | Status: DC
  Administered 2021-04-30 – 2021-05-04 (×5): 325 mg via ORAL
  Filled 2021-04-29 (×5): qty 1

## 2021-04-29 MED ORDER — PILOCARPINE HCL 1 % OP SOLN
1.0000 [drp] | Freq: Two times a day (BID) | OPHTHALMIC | Status: DC
  Administered 2021-04-30 – 2021-05-04 (×10): 1 [drp] via OPHTHALMIC
  Filled 2021-04-29: qty 15

## 2021-04-29 MED ORDER — GABAPENTIN 100 MG PO CAPS
100.0000 mg | ORAL_CAPSULE | Freq: Every day | ORAL | Status: DC
  Administered 2021-04-30 – 2021-05-04 (×5): 100 mg via ORAL
  Filled 2021-04-29 (×5): qty 1

## 2021-04-29 MED ORDER — ACETAMINOPHEN 500 MG PO TABS
1000.0000 mg | ORAL_TABLET | Freq: Once | ORAL | Status: AC
  Administered 2021-04-29: 1000 mg via ORAL
  Filled 2021-04-29: qty 2

## 2021-04-29 MED ORDER — SODIUM CHLORIDE 0.9 % IV BOLUS (SEPSIS)
1000.0000 mL | Freq: Once | INTRAVENOUS | Status: AC
Start: 2021-04-29 — End: 2021-04-29
  Administered 2021-04-29: 1000 mL via INTRAVENOUS

## 2021-04-29 MED ORDER — ROFLUMILAST 500 MCG PO TABS
500.0000 ug | ORAL_TABLET | Freq: Every day | ORAL | Status: DC
  Administered 2021-04-30 – 2021-05-04 (×5): 500 ug via ORAL
  Filled 2021-04-29 (×5): qty 1

## 2021-04-29 MED ORDER — SODIUM CHLORIDE 0.9% FLUSH
3.0000 mL | Freq: Two times a day (BID) | INTRAVENOUS | Status: DC
  Administered 2021-04-30 – 2021-05-04 (×10): 3 mL via INTRAVENOUS

## 2021-04-29 MED ORDER — ALBUTEROL (5 MG/ML) CONTINUOUS INHALATION SOLN
10.0000 mg/h | INHALATION_SOLUTION | RESPIRATORY_TRACT | Status: AC
  Filled 2021-04-29: qty 20

## 2021-04-29 MED ORDER — ALBUTEROL (5 MG/ML) CONTINUOUS INHALATION SOLN
10.0000 mg/h | INHALATION_SOLUTION | RESPIRATORY_TRACT | Status: AC
  Administered 2021-04-29: 10 mg/h via RESPIRATORY_TRACT

## 2021-04-29 MED ORDER — IPRATROPIUM-ALBUTEROL 0.5-2.5 (3) MG/3ML IN SOLN
3.0000 mL | Freq: Four times a day (QID) | RESPIRATORY_TRACT | Status: DC
  Administered 2021-04-29 – 2021-05-04 (×18): 3 mL via RESPIRATORY_TRACT
  Filled 2021-04-29 (×19): qty 3

## 2021-04-29 MED ORDER — ALBUTEROL SULFATE HFA 108 (90 BASE) MCG/ACT IN AERS
2.0000 | INHALATION_SPRAY | Freq: Once | RESPIRATORY_TRACT | Status: AC
  Administered 2021-04-29: 2 via RESPIRATORY_TRACT
  Filled 2021-04-29: qty 6.7

## 2021-04-29 MED ORDER — LEVOTHYROXINE SODIUM 50 MCG PO TABS
50.0000 ug | ORAL_TABLET | Freq: Every day | ORAL | Status: DC
  Administered 2021-04-30 – 2021-05-04 (×5): 50 ug via ORAL
  Filled 2021-04-29 (×5): qty 1

## 2021-04-29 MED ORDER — ALLOPURINOL 100 MG PO TABS
100.0000 mg | ORAL_TABLET | Freq: Every day | ORAL | Status: DC
  Administered 2021-04-30 – 2021-05-04 (×5): 100 mg via ORAL
  Filled 2021-04-29 (×6): qty 1

## 2021-04-29 MED ORDER — SODIUM CHLORIDE 0.9 % IV SOLN
2.0000 g | INTRAVENOUS | Status: DC
  Administered 2021-04-29: 2 g via INTRAVENOUS
  Filled 2021-04-29: qty 20

## 2021-04-29 MED ORDER — DOXYCYCLINE HYCLATE 100 MG PO TABS
100.0000 mg | ORAL_TABLET | Freq: Two times a day (BID) | ORAL | Status: AC
  Administered 2021-04-30 – 2021-05-04 (×10): 100 mg via ORAL
  Filled 2021-04-29 (×10): qty 1

## 2021-04-29 MED ORDER — ASPIRIN EC 81 MG PO TBEC
81.0000 mg | DELAYED_RELEASE_TABLET | Freq: Every day | ORAL | Status: DC
  Administered 2021-04-30 – 2021-05-04 (×5): 81 mg via ORAL
  Filled 2021-04-29 (×5): qty 1

## 2021-04-29 MED ORDER — LACTATED RINGERS IV SOLN: INTRAVENOUS | Status: DC

## 2021-04-29 MED ORDER — MELATONIN 5 MG PO TABS
10.0000 mg | ORAL_TABLET | Freq: Every day | ORAL | Status: DC
  Administered 2021-04-30 – 2021-05-03 (×5): 10 mg via ORAL
  Filled 2021-04-29 (×5): qty 2

## 2021-04-29 MED ORDER — METHYLPREDNISOLONE SODIUM SUCC 125 MG IJ SOLR
125.0000 mg | Freq: Once | INTRAMUSCULAR | Status: AC
  Administered 2021-04-29: 125 mg via INTRAVENOUS
  Filled 2021-04-29: qty 2

## 2021-04-29 MED ORDER — AZITHROMYCIN 250 MG PO TABS: 250.0000 mg | ORAL_TABLET | Freq: Every day | ORAL | Status: DC

## 2021-04-29 MED ORDER — ALBUTEROL SULFATE (2.5 MG/3ML) 0.083% IN NEBU
2.5000 mg | INHALATION_SOLUTION | RESPIRATORY_TRACT | Status: DC | PRN
  Administered 2021-05-04: 2.5 mg via RESPIRATORY_TRACT
  Filled 2021-04-29: qty 3

## 2021-04-29 MED ORDER — OXYCODONE HCL ER 10 MG PO T12A
10.0000 mg | EXTENDED_RELEASE_TABLET | Freq: Two times a day (BID) | ORAL | Status: DC
  Administered 2021-04-30 – 2021-05-04 (×9): 10 mg via ORAL
  Filled 2021-04-29 (×9): qty 1

## 2021-04-29 MED ORDER — SODIUM CHLORIDE 0.9 % IV SOLN
500.0000 mg | INTRAVENOUS | Status: DC
  Administered 2021-04-29: 500 mg via INTRAVENOUS
  Filled 2021-04-29: qty 500

## 2021-04-29 MED ORDER — TRAZODONE HCL 150 MG PO TABS
150.0000 mg | ORAL_TABLET | Freq: Every day | ORAL | Status: DC
  Administered 2021-04-30 – 2021-05-03 (×5): 150 mg via ORAL
  Filled 2021-04-29 (×6): qty 1

## 2021-04-29 MED ORDER — SPIRONOLACTONE 12.5 MG HALF TABLET
12.5000 mg | ORAL_TABLET | Freq: Every day | ORAL | Status: DC
  Administered 2021-04-30 – 2021-05-03 (×4): 12.5 mg via ORAL
  Filled 2021-04-29 (×4): qty 1

## 2021-04-29 MED ORDER — PRAMIPEXOLE DIHYDROCHLORIDE 0.25 MG PO TABS
0.2500 mg | ORAL_TABLET | Freq: Every day | ORAL | Status: DC
  Administered 2021-04-30 – 2021-05-03 (×4): 0.25 mg via ORAL
  Filled 2021-04-29 (×4): qty 1

## 2021-04-29 MED ORDER — POLYETHYLENE GLYCOL 3350 17 G PO PACK: 17.0000 g | PACK | Freq: Every day | ORAL | Status: DC | PRN

## 2021-04-29 MED ORDER — NITROGLYCERIN 0.4 MG SL SUBL: 0.4000 mg | SUBLINGUAL_TABLET | SUBLINGUAL | Status: DC | PRN

## 2021-04-29 MED ORDER — PREDNISONE 20 MG PO TABS
40.0000 mg | ORAL_TABLET | Freq: Every day | ORAL | Status: DC
  Administered 2021-04-30: 40 mg via ORAL
  Filled 2021-04-29: qty 2

## 2021-04-29 MED ORDER — ACETAMINOPHEN 650 MG RE SUPP: 650.0000 mg | Freq: Four times a day (QID) | RECTAL | Status: DC | PRN

## 2021-04-29 MED ORDER — ATORVASTATIN CALCIUM 10 MG PO TABS
20.0000 mg | ORAL_TABLET | Freq: Every day | ORAL | Status: DC
  Administered 2021-04-30 – 2021-05-03 (×4): 20 mg via ORAL
  Filled 2021-04-29 (×4): qty 2

## 2021-04-29 MED ORDER — PANTOPRAZOLE SODIUM 40 MG PO TBEC
40.0000 mg | DELAYED_RELEASE_TABLET | Freq: Every day | ORAL | Status: DC
  Administered 2021-04-30 – 2021-05-04 (×5): 40 mg via ORAL
  Filled 2021-04-29 (×2): qty 1
  Filled 2021-04-29: qty 2
  Filled 2021-04-29 (×2): qty 1

## 2021-04-29 MED ORDER — ENOXAPARIN SODIUM 40 MG/0.4ML IJ SOSY
40.0000 mg | PREFILLED_SYRINGE | INTRAMUSCULAR | Status: DC
  Administered 2021-04-30 – 2021-05-01 (×3): 40 mg via SUBCUTANEOUS
  Filled 2021-04-29 (×3): qty 0.4

## 2021-04-29 NOTE — Telephone Encounter (Signed)
Pt c/o swelling: STAT is pt has developed SOB within 24 hours  1) How much weight have you gained and in what time span? Yes she has gained 4lbs in 1 week  2) If swelling, where is the swelling located? Bilateral feet swelling  3) Are you currently taking a fluid pill? Yes 80mg  per day  4) Are you currently SOB? Yes but its not uncommon, Pt is currently on oxygen 4 liters 24/7  5) Do you have a log of your daily weights (if so, list)? 168lbs to 172 now  6) Have you gained 3 pounds in a day or 5 pounds in a week?  Yes  7) Have you traveled recently? No  Pt's granddaughter called to reschedule [name] appt due to swelling in both of her feet, both feet have been swollen for 1 week Granddaughter [name] phone number is [phone] if you are unable to reach pt.

## 2021-04-29 NOTE — Progress Notes (Signed)
Pt received 2 puffs of albuterol. Waiting for covid test results to come back before giving nebulized albuterol.

## 2021-04-29 NOTE — Telephone Encounter (Signed)
Please set pt up for CBC, BMET and BNP

## 2021-04-29 NOTE — H&P (Signed)
History and Physical   Reverie Vaquera YNW:[id number] DOB: 08/23/1941 DOA: 04/29/2021  PCP: [name], MD   Patient coming from: Home  Chief Complaint: Shortness of breath  HPI: [name] is a 80 y.o. female with medical history significant of CHF, CAD, GERD, angiodysplasia of intestine, chronic low back pain, gout, COPD, hyperlipidemia, hypertension, history of PE, hypothyroidism, PAD, left subclavian artery stenosis, anemia, OSA who presents with shortness of breath.  Patient reports 2 days of ongoing shortness of breath with an associated productive cough with yellow sputum.  She remains on her baseline 4 L of home oxygen.  She states that she got to the point where she was short of breath just walking across the room.  She tried her home albuterol which was no help. Per EMS she was tachypneic and in A. fib rhythm in route, though no A-fib has been noted here and no prior history of A. Fib  She denies fevers, chills, chest pain, abdominal pain, constipation, diarrhea, nausea, vomiting.  ED Course: Vital signs in the ED significant for fever to 100.6.  Remaining on home 4 L.  Blood pressure in the 657Q to 469G systolic.  Heart rate in the 90s to 100s.  Lab work-up showed CMP with creatinine 1.1 which is stable, glucose 124.  CBC showed hemoglobin 11.4 which is stable.  PT, PTT, INR within normal limits.  Lactic acid pending.  Respiratory panel for flu and COVID is negative.  Urinalysis, urine culture blood culture pending.  Chest x-ray showed vascular congestion consistent with mild CHF.  In the ED she received Solu-Medrol, ceftriaxone and azithromycin, 1 L of IV fluids, albuterol nebs with some improvement.  Review of Systems: As per HPI otherwise all other systems reviewed and are negative.  Past Medical History:  Diagnosis Date  . Anemia    bld. transfusion post lumbar surgery- 2012  . Anxiety   . Arthralgia    NOS  . Blood transfusion 2012; 02/2018   WITH BACK  SURGERY; "related to hematoma in stomach" (10/06/2018)  . CAD (coronary artery disease)    s/p CABG 2004; s/p DES to LM in 2010;  [place] 10/29/11: EF 50-55%, mild elevated filling pressures, no pulmonary HTN, LM 90% ISR, LAD and CFX occluded, S-RI occluded (old), S-OM3 ok and L-LAD ok, native nondominant RCA 95% -  med rx recommended ; Lexiscan Myoview 7/13 at [facility]: demonstrated "normal LV function, anterior attenuation and localized ischemia, inferior, basilar, mid section"  . Carotid artery disease ([place])    Carotid US 2/95:  RICA 2-84; LICA 13-24; R subclavian stenosis - Repeat 1 year. // Carotid US 05/2019: R 1-39; L 40-59; R vertebral with atypical antegrade flow; R subclavian stenosis >> repeat 1 year // Carotid US 7/21: Bilat 40-59; L subclavian stenosis   . CHF (congestive heart failure) ([place]) 01/21/2020  . Chronic diastolic heart failure (HCC)    Echo 9/10: EF 40-10%, grade 1 diastolic dysfunction  . Chronic lower back pain   . COPD (chronic obstructive pulmonary disease) ([place])    Emphysema dxed by Dr. Woody Seller in [place] based on PFTs per pt in 2006; placed on albuterol  . Depression    TAKES CELEXA  AND  (OFF- WELBUTRIN)  . Diuretic-induced hypokalemia 10/08/2018  . DVT of lower extremity (deep venous thrombosis) (HCC)    recurrent. bilateral (2 episodes)  . Dyspnea    home o2 when needed  . Dysrhythmia    afib with cabg  . Exertional angina (  [place])    Treated with Isosorbide, Ranexa, amlodipine; intolerant to metoprolol  . GERD (gastroesophageal reflux disease)   . Gout    "on daily RX" (10/06/2018)  . HLD (hyperlipidemia)   . Hypertension   . Hyperthyroidism   . L Subclavian Artery Stenosis    Carotid US 7/21   . Obesity (BMI 30-39.9) 2009   BMI 33  . Osteoarthritis    "all over; hands" (10/06/2018)  . Oxygen deficiency   . Oxygen dependent    4L at home as needed (10/06/2018)  . Pneumonia   . PVD (peripheral vascular disease) ([place])   . Sleep apnea 2012   USED CPAP  THEN  STARTED USING SPIRIVA , Nov. 2013- last evaluation , changed from mask to aparatus that is just for her nose.. ; reports 05-28-18 "the mask smothers me so i dont use it right now" (10/06/2018)    Past Surgical History:  Procedure Laterality Date  . ANKLE SURGERY  2004  . ANTERIOR CERVICAL DECOMP/DISCECTOMY FUSION  11/2007  . AUGMENTATION MAMMAPLASTY    . [place]   lower; another scheduled, opt. reports 4 back- lumbar, 3 cerv. fusions  for later 2009  . CARDIAC CATHETERIZATION  11/2009   Patent LIMA to LAD and patent SVG to OM1. Occluded SVG to ramus and diagonal. Left main: 90% ostial stenosis, LCX 60-70% proximal stenosis  . CATARACT EXTRACTION W/ INTRAOCULAR LENS  IMPLANT, BILATERAL Bilateral 2006  . COLONOSCOPY WITH PROPOFOL N/A 02/17/2017   Procedure: COLONOSCOPY WITH PROPOFOL;  Surgeon: Jerene Bears, MD;  Location: [facility] ENDOSCOPY;  Service: Endoscopy;  Laterality: N/A;  . CORONARY ANGIOPLASTY WITH STENT PLACEMENT  11/2009   Drug eluting stent to left main artery: 4.0 X 12 mm Ion   . CORONARY ARTERY BYPASS GRAFT  2004   "CABG X4"  . ESOPHAGOGASTRODUODENOSCOPY N/A 02/14/2017   Procedure: ESOPHAGOGASTRODUODENOSCOPY (EGD);  Surgeon: Doran Stabler, MD;  Location: [facility] ENDOSCOPY;  Service: Endoscopy;  Laterality: N/A;  . GIVENS CAPSULE STUDY N/A 02/15/2017   Procedure: GIVENS CAPSULE STUDY;  Surgeon: Doran Stabler, MD;  Location: [place];  Service: Endoscopy;  Laterality: N/A;  . GROIN MASS OPEN BIOPSY  2004  . IR IVC FILTER PLMT / S&I /IMG GUID/MOD SED  03/14/2018  . IR PARACENTESIS  03/10/2018  . JOINT REPLACEMENT    . LAPAROSCOPIC CHOLECYSTECTOMY    . PERIPHERAL VASCULAR INTERVENTION Left 03/05/2018   Procedure: PERIPHERAL VASCULAR INTERVENTION;  Surgeon: Elam Dutch, MD;  Location: [place] CV LAB;  Service: Cardiovascular;  Laterality: Left;  Attempted unsuccess\ful Per Dr. Eden Lathe  . PERIPHERAL VASCULAR INTERVENTION  03/08/2018   Procedure: PERIPHERAL  VASCULAR INTERVENTION;  Surgeon: Waynetta Sandy, MD;  Location: [place] CV LAB;  Service: Cardiovascular;;  SMA Stent   . REPLACEMENT TOTAL KNEE BILATERAL Bilateral 2012  . REVERSE SHOULDER ARTHROPLASTY  02/26/2012   Procedure: REVERSE SHOULDER ARTHROPLASTY;  Surgeon: Marin Shutter, MD;  Location: [place];  Service: Orthopedics;  Laterality: Right;  RIGHT SHOULDER REVERSED ARTHROPLASTY  . SHOULDER ARTHROSCOPY WITH SUBACROMIAL DECOMPRESSION Left 02/10/2013   Procedure: SHOULDER ARTHROSCOPY WITH SUBACROMIAL DECOMPRESSION DISTAL CLAVICLE RESECTION;  Surgeon: Marin Shutter, MD;  Location: [place];  Service: Orthopedics;  Laterality: Left;  DISTAL CLAVICLE RESECTION  . TONSILLECTOMY    . TOTAL HIP ARTHROPLASTY  2007   Right  . TOTAL HIP ARTHROPLASTY Left 02/07/2020   Procedure: TOTAL HIP ARTHROPLASTY ANTERIOR APPROACH;  Surgeon: Paralee Cancel, MD;  Location: [facility];  Service: Orthopedics;  Laterality: Left;  70 mins  . TRANSURETHRAL RESECTION OF BLADDER TUMOR N/A 05/31/2018   Procedure: TRANSURETHRAL RESECTION OF BLADDER TUMOR (TURBT);  Surgeon: Lucas Mallow, MD;  Location: [facility] ORS;  Service: Urology;  Laterality: N/A;  . VISCERAL ANGIOGRAPHY N/A 03/05/2018   Procedure: VISCERAL ANGIOGRAPHY;  Surgeon: Elam Dutch, MD;  Location: [place] CV LAB;  Service: Cardiovascular;  Laterality: N/A;  . VISCERAL ANGIOGRAPHY N/A 03/08/2018   Procedure: VISCERAL ANGIOGRAPHY;  Surgeon: Waynetta Sandy, MD;  Location: [place] CV LAB;  Service: Cardiovascular;  Laterality: N/A;    Social History  reports that she quit smoking about 20 years ago. Her smoking use included cigarettes. She has a 45.00 pack-year smoking history. She has never used smokeless tobacco. She reports that she does not drink alcohol and does not use drugs.  Allergies  Allergen Reactions  . Ativan [Lorazepam] Other (See Comments)    Confusion   . Pitavastatin Itching    Reaction to Livalo  . Ropinirole  Nausea Only and Other (See Comments)    Makes legs jump, also  . Zofran [Ondansetron Hcl] Nausea Only  . Zolpidem Tartrate Anxiety and Other (See Comments)    CONFUSION   . Penicillins Rash and Other (See Comments)    Tolerated cephalosporins in past Has patient had a PCN reaction causing immediate rash, facial/tongue/throat swelling, SOB or lightheadedness with hypotension: Yes Has patient had a PCN reaction causing severe rash involving mucus membranes or skin necrosis: No Has patient had a PCN reaction that required hospitalization: No Has patient had a PCN reaction occurring within the last 10 years: No If all of the above answers are "NO", then may proceed with Cephalosporin use.     Family History  Problem Relation Age of Onset  . Ovarian cancer Mother   . Lung cancer Father   . COPD Father   . Heart disease Father   . Heart disease Sister   . Stomach cancer Sister   . Colitis Sister   Reviewed on admission  Prior to Admission medications   Medication Sig Start Date End Date Taking? Authorizing Provider  allopurinol (ZYLOPRIM) 100 MG tablet Take 100 mg by mouth daily with breakfast. 01/30/17   [provider]  ARIPiprazole (ABILIFY) 2 MG tablet Take 1 mg by mouth daily with breakfast. 12/29/19   [provider]  aspirin EC 81 MG tablet Take 81 mg by mouth daily with breakfast. Swallow whole.    [provider]  Aspirin-Salicylamide-Caffeine (BC HEADACHE POWDER PO) Take 1 packet by mouth 2 (two) times daily as needed (severe headache).    [provider]  atorvastatin (LIPITOR) 20 MG tablet Take 20 mg by mouth daily with supper. 09/21/20   [provider]  cholecalciferol (VITAMIN D3) 25 MCG (1000 UNIT) tablet Take 1,000 Units by mouth daily with supper.    [provider]  citalopram (CELEXA) 40 MG tablet Take 40 mg by mouth daily with supper. 08/30/19   [provider]  cyclobenzaprine (FLEXERIL) 10 MG tablet Take 10 mg  by mouth 2 (two) times daily.    [provider]  ferrous sulfate 325 (65 FE) MG tablet Take 325 mg by mouth daily with breakfast.    [provider]  furosemide (LASIX) 40 MG tablet Take 1 tablet (40 mg total) by mouth daily. Patient taking differently: Take 40 mg by mouth daily. 02/08/21   Almyra Deforest, PA  gabapentin (NEURONTIN) 100 MG  capsule Take 100 mg by mouth daily with breakfast. 01/09/20   [provider]  Ipratropium-Albuterol (COMBIVENT RESPIMAT) 20-100 MCG/ACT AERS respimat Inhale 1 puff into the lungs 3 (three) times daily as needed for wheezing or shortness of breath.    [provider]  levothyroxine (SYNTHROID) 50 MCG tablet Take 1 tablet (50 mcg total) by mouth daily at 6 (six) AM. 09/13/20   Fay Records, MD  magnesium oxide (MAG-OX) 400 (241.3 Mg) MG tablet Take 1 tablet (400 mg total) by mouth 2 (two) times daily. 03/26/18   Mikhail, Velta Addison, DO  Melatonin 10 MG TABS Take 30 mg by mouth at bedtime.    [provider]  nitroGLYCERIN (NITROSTAT) 0.4 MG SL tablet Place 1 tablet (0.4 mg total) under the tongue every 5 (five) minutes as needed for chest pain. For chest pain 04/24/16   Fay Records, MD  omeprazole (PRILOSEC) 40 MG capsule TAKE 1 CAPSULE BY MOUTH 2 TIMES DAILY. 12/20/20   Esterwood, Amy S, PA-C  OXYGEN Inhale 4 L/min into the lungs as needed (DURING ALL TIMES OF EXERTION).     [provider]  pilocarpine (PILOCAR) 1 % ophthalmic solution Place 1 drop into both eyes 2 (two) times daily.    [provider]  potassium chloride SA (KLOR-CON) 20 MEQ tablet Take 20 mEq by mouth daily with lunch.    [provider]  pramipexole (MIRAPEX) 0.25 MG tablet Take 0.25 mg by mouth daily with supper. 08/22/20   [provider]  roflumilast (DALIRESP) 500 MCG TABS tablet Take 500 mcg by mouth daily with breakfast.    [provider]  spironolactone (ALDACTONE) 25 MG tablet Take 0.5 tablets (12.5 mg total)  by mouth daily. 02/12/21   Katherine Roan, MD  traZODone (DESYREL) 50 MG tablet Take 150 mg by mouth at bedtime.  10/07/18   [provider]  vitamin C (ASCORBIC ACID) 500 MG tablet Take 500 mg by mouth daily with lunch.    [provider]  XTAMPZA ER 9 MG C12A Take 9 mg by mouth 2 (two) times daily. 09/25/20   [provider]    Physical Exam: Vitals:   04/29/21 2030 04/29/21 2057 04/29/21 2110 04/29/21 2213  BP: (!) 143/63     Pulse: 99     Resp: 20     Temp:  (!) 100.6 F (38.1 C)    TempSrc:  Rectal    SpO2: 94%  96% 98%  Weight:      Height:       Physical Exam Constitutional:      General: She is not in acute distress.    Appearance: Normal appearance.  HENT:     Head: Normocephalic and atraumatic.     Mouth/Throat:     Mouth: Mucous membranes are moist.     Pharynx: Oropharynx is clear.  Eyes:     Extraocular Movements: Extraocular movements intact.     Pupils: Pupils are equal, round, and reactive to light.  Cardiovascular:     Rate and Rhythm: Regular rhythm. Tachycardia present.     Pulses: Normal pulses.     Heart sounds: Normal heart sounds.  Pulmonary:     Effort: Pulmonary effort is normal. No respiratory distress.     Breath sounds: Wheezing present.  Abdominal:     General: Bowel sounds are normal. There is no distension.     Palpations: Abdomen is soft.     Tenderness: There is no abdominal tenderness.  Musculoskeletal:        General: No swelling or deformity.     Right lower leg: Edema present.     Left lower leg: Edema present.  Skin:    General: Skin is warm and dry.  Neurological:     General: No focal deficit present.     Mental Status: Mental status is at baseline.    Labs on Admission: I have personally reviewed following labs and imaging studies  CBC: Recent Labs  Lab 04/29/21 1900  WBC 7.1  NEUTROABS 5.4  HGB 11.4*  HCT 38.7  MCV 92.1  PLT 207    Basic Metabolic Panel: Recent Labs  Lab  04/29/21 1900  NA 138  K 4.5  CL 100  CO2 32  GLUCOSE 124*  BUN 14  CREATININE 1.10*  CALCIUM 8.3*    GFR: Estimated Creatinine Clearance: 44.9 mL/min (A) (by C-G formula based on SCr of 1.1 mg/dL (H)).  Liver Function Tests: Recent Labs  Lab 04/29/21 1900  AST 19  ALT 12  ALKPHOS 64  BILITOT 0.5  PROT 6.7  ALBUMIN 3.5    Urine analysis:    Component Value Date/Time   COLORURINE YELLOW 10/10/2020 1125   APPEARANCEUR CLEAR 10/10/2020 1125   LABSPEC 1.014 10/10/2020 1125   PHURINE 5.0 10/10/2020 1125   GLUCOSEU NEGATIVE 10/10/2020 1125   HGBUR NEGATIVE 10/10/2020 1125   BILIRUBINUR NEGATIVE 10/10/2020 1125   KETONESUR NEGATIVE 10/10/2020 1125   PROTEINUR NEGATIVE 10/10/2020 1125   UROBILINOGEN 1.0 03/30/2011 0901   NITRITE NEGATIVE 10/10/2020 1125   LEUKOCYTESUR NEGATIVE 10/10/2020 1125    Radiological Exams on Admission: DG Chest Port 1 View  Result Date: 04/29/2021 CLINICAL DATA:  Shortness of breath and weight gain EXAM: PORTABLE CHEST 1 VIEW COMPARISON:  02/05/2021 FINDINGS: Cardiac shadow is mildly prominent but stable. Postsurgical changes are again seen. Lungs are well aerated bilaterally. Increased vascular congestion is noted without significant edema. No focal infiltrate is noted. Degenerative changes of the thoracic spine are noted. IMPRESSION: Vascular congestion consistent with mild CHF. Electronically Signed   By: Alcide CleverMark  Lukens M.D.   On: 04/29/2021 19:59   EKG: Independently reviewed.  Sinus tachycardia with PACs.  Prolonged QTC at 501.  Assessment/Plan Principal Problem:   COPD exacerbation (HCC) Active Problems:   Hypothyroidism   Essential hypertension   Dyslipidemia   PAD (peripheral artery disease) (HCC)   GERD without esophagitis   Chronic bilateral low back pain without sciatica   CHF (congestive heart failure) (HCC)   CAD (coronary artery disease) of bypass graft   Prolonged QT interval   Depression   RLS (restless legs  syndrome)  COPD exacerbation > Presented with wheezing and increasing shortness of breath.  Some response to albuterol in the ED. > Chest x-ray with vascular congestion consistent with mild CHF. > Noted to have a fever to 100.6 however white count is normal chest x-ray does not show pneumonia.  Will check viral panel. > Remains on home chronic 4 L of oxygen - Monitor on telemetry - Daily prednisone - DuoNebs every 6 hours while awake - As needed albuterol - Azithromycin given increased pedal production - Follow-up full viral panel - Continue home Roflumilast  CHF Hypertension > Last echo in October 21 with EF 60-65%, indeterminate diastolic dysfunction, RV low normal > Only evidence of mild vascular congestion on chest x-ray.  Bilateral pedal edema. Will check BNP to ensure no heart failure component. - Continue home diuretics - Monitor renal function and  electrolytes - Check BNP - Strict I's/O, daily weight  Tachycardia Prolonged QTC > Reports of A. fib in route via EMS however has not been demonstrated in the ED thus far.  EKG showed some irregular tachycardia that appeared to be sinus rhythm with PACs.  In her room the monitor showed increased tachycardia in the 110s to 120s but this appeared to be sinus tach and she states that this is a typical response to her when she requires significant albuterol. > Prolonged QTC of around 500 demonstrated in ED - Continue to monitor - Avoid QT prolonging medications as able, switch azithromycin to Doxy - Repeat EKG in the morning  CAD PAD Hyperlipidemia > History of CABG in 2004 and DES in 2010 and 2012 > No chest pain - Continue home aspirin and atorvastatin  Chronic pain - Continue home Xtampza ER  Anxiety Depression - Continue home Abilify, trazodone  Restless leg - Continue home pramipexole  GERD > Has a history of GI bleed - Continue home PPI  Gout  - Continue home allopurinol  Hypothyroidism - Continue home  Synthroid  OSA - Continue home oxygen  DVT prophylaxis: Lovenox Code Status:   Full  Family Communication:  Granddaughter updated at bedside Disposition Plan:   Patient is from:  Home  Anticipated DC to:  Home  Anticipated DC date:  1 to 3 days  Anticipated DC barriers: None  Consults called:  None  Admission status:  Observation, telemetry   Severity of Illness: The appropriate patient status for this patient is OBSERVATION. Observation status is judged to be reasonable and necessary in order to provide the required intensity of service to ensure the patient's safety. The patient's presenting symptoms, physical exam findings, and initial radiographic and laboratory data in the context of their medical condition is felt to place them at decreased risk for further clinical deterioration. Furthermore, it is anticipated that the patient will be medically stable for discharge from the hospital within 2 midnights of admission. The following factors support the patient status of observation.   " The patient's presenting symptoms include shortness of breath. " The physical exam findings include pedal edema, wheezing, tachycardia. " The initial radiographic and laboratory data are creatinine stable 1.1, hemoglobin stable 11.4.  Chest x-ray with patchy congestion with mild CHF.   Marcelyn Bruins MD [facility]  How to contact the [facility] Attending or Consulting provider [place] or covering provider during after hours [place], for this patient?   1. Check the care team in [facility] and look for a) attending/consulting [facility] provider listed and b) the [facility] team listed 2. Log into [email or web address] and use [place]'s universal password to access. If you do not have the password, please contact the hospital operator. 3. Locate the [facility] provider you are looking for under [facility] and page to a number that you can be directly reached. 4. If you still have difficulty reaching the provider, please  page the [facility] (Director on Call) for the Hospitalists listed on amion for assistance.  04/29/2021, 11:15 PM

## 2021-04-29 NOTE — ED Notes (Signed)
Eulanda Dorion (granddaughter) provided update on COVID result per result.

## 2021-04-29 NOTE — ED Notes (Signed)
Patient grand daughter wants staff to know that patient usually sleeps in recliner at home.

## 2021-04-29 NOTE — ED Notes (Signed)
Granddaughter at bedside at this time.

## 2021-04-29 NOTE — Progress Notes (Signed)
Pharmacy Antibiotic Note  [name] is a 80 y.o. female admitted on 04/29/2021 with pneumonia.  Pharmacy has been consulted for Levaquin dosing in the setting of penicillin allergy. Per consult instructions, will switch to ceftriaxone and azithromycin since patient has tolerated ceftriaxone on numerous occasions.     Plan: -D/c Levaquin -Start Ceftriaxone 2 gm IV Q 24 hours -Start azithromycin 500 mg IV Q 24 hours -Monitor clinical progress -Pharmacy to sign off and monitor peripherally      No data recorded.  No results for input(s): WBC, CREATININE, LATICACIDVEN, VANCOTROUGH, VANCOPEAK, VANCORANDOM, GENTTROUGH, GENTPEAK, GENTRANDOM, TOBRATROUGH, TOBRAPEAK, TOBRARND, AMIKACINPEAK, AMIKACINTROU, AMIKACIN in the last 168 hours.  CrCl cannot be calculated (Patient's most recent lab result is older than the maximum 21 days allowed.).    Allergies  Allergen Reactions  . Ativan [Lorazepam] Other (See Comments)    Confusion   . Pitavastatin Itching    Reaction to Livalo  . Ropinirole Nausea Only and Other (See Comments)    Makes legs jump, also  . Zofran [Ondansetron Hcl] Nausea Only  . Zolpidem Tartrate Anxiety and Other (See Comments)    CONFUSION   . Penicillins Rash and Other (See Comments)    Tolerated cephalosporins in past Has patient had a PCN reaction causing immediate rash, facial/tongue/throat swelling, SOB or lightheadedness with hypotension: Yes Has patient had a PCN reaction causing severe rash involving mucus membranes or skin necrosis: No Has patient had a PCN reaction that required hospitalization: No Has patient had a PCN reaction occurring within the last 10 years: No If all of the above answers are "NO", then may proceed with Cephalosporin use.     Antimicrobials this admission: Ceftriaxone 5/9 >>  Azithro 5/9 >>   Dose adjustments this admission:   Microbiology results: 5/9 BCx:  5/9 UCx:    Thank you for allowing pharmacy to be a part of  this patient's care.  Albertina Parr, PharmD., BCPS, BCCCP Clinical Pharmacist Please refer to [facility] for unit-specific pharmacist

## 2021-04-29 NOTE — ED Notes (Signed)
RT called for continuous neb, pt audibly wheezing after rectal temp and pure wicked

## 2021-04-29 NOTE — Telephone Encounter (Signed)
Per patient's granddaughter, pt has been taking Lasix 40 mg BID, increased from daily by her PCP.  She did not feel like coming to the appt today with APP because she has a cold - nasal congestion and cough.  Her feet are swollen more than normal and her weight has increased 4 pounds over the last week.  She is using albuterol which is helping and taking a cold medicine.  I adv to avoid decongestants.  She is still adding salt not as much per granddaughter and last week they ate out at [facility]    She is SOB but no more than normal. Pox staying 95-97%.  Dry cough, occas wheeze which is not new per granddaughter.  Sleeping in chair w feet down.  Cannot sleep lying down because of the cold.  I adv to get an appt with PCP.  Not r/s back in cardiology until August.  She can get labs at PCP in [place] if any are needed.  She is aware I am forwarding to Dr. Harrington Challenger and will call them back with any recommendations.

## 2021-04-29 NOTE — ED Triage Notes (Signed)
Pt arrived via [facility] for c/o SOBx2 days, productive cough w/yellow sputum. Pt wears 4L 02 per [place] at baseline. Pt is A&Ox4. Pt is A-fib on the monitor. Pt is tachypneic.

## 2021-04-29 NOTE — ED Notes (Signed)
Vy Badley granddaughter [phone]) provided updated made this RN aware pt PCP increased Albify 1 tablet AM daily.

## 2021-04-29 NOTE — ED Notes (Signed)
Continuous neb being administered at this time, Tylenol to be administered post completion.

## 2021-04-29 NOTE — Sepsis Progress Note (Signed)
Following for sepsis monitoring ?

## 2021-04-29 NOTE — ED Provider Notes (Signed)
[facility] EMERGENCY DEPARTMENT Provider Note   CSN: [id number] Arrival date & time: 04/29/21  1846     History Chief Complaint  Patient presents with  . Code Sepsis    [name] is a 80 y.o. female.  HPI   This patient is an 80 year old female, she has multiple medical problems including coronary disease, congestive heart failure, she has had multiple stents in the past, she has a history of COPD from years of smoking, she does take albuterol treatments as needed at home.  She has a history of gastrointestinal bleeding in the past and has required transfusion and has a history of hypertension and hyperthyroidism.  She presents to the hospital today with a complaint of increasing shortness of breath.  This started about 1 week ago has been progressive, gradually worsening and has now become severe prompting her to use albuterol treatments at home which have given her only transient minimal relief.  She cannot walk across her bedroom without becoming severely dyspneic.  She arrives by EMS transport from [facility], her family doctor is Dr. Manson Allan.  EMS reports tachycardia, fever and hypoxia - requiring o2 by [place]    Past Medical History:  Diagnosis Date  . Anemia    bld. transfusion post lumbar surgery- 2012  . Anxiety   . Arthralgia    NOS  . Blood transfusion 2012; 02/2018   WITH BACK SURGERY; "related to hematoma in stomach" (10/06/2018)  . CAD (coronary artery disease)    s/p CABG 2004; s/p DES to LM in 2010;  [place] 10/29/11: EF 50-55%, mild elevated filling pressures, no pulmonary HTN, LM 90% ISR, LAD and CFX occluded, S-RI occluded (old), S-OM3 ok and L-LAD ok, native nondominant RCA 95% -  med rx recommended ; Lexiscan Myoview 7/13 at [facility]: demonstrated "normal LV function, anterior attenuation and localized ischemia, inferior, basilar, mid section"  . Carotid artery disease ([place])    Carotid US [id number]:  RICA [id number]; LICA [id number]; R subclavian  stenosis - Repeat 1 year. // Carotid US 05/2019: R 1-39; L 40-59; R vertebral with atypical antegrade flow; R subclavian stenosis >> repeat 1 year // Carotid US 7/21: Bilat 40-59; L subclavian stenosis   . CHF (congestive heart failure) ([place]) 01/21/2020  . Chronic diastolic heart failure (HCC)    Echo 9/10: EF Q000111Q, grade 1 diastolic dysfunction  . Chronic lower back pain   . COPD (chronic obstructive pulmonary disease) ([place])    Emphysema dxed by Dr. Woody Seller in [place] based on PFTs per pt in 2006; placed on albuterol  . Depression    TAKES CELEXA  AND  (OFF- WELBUTRIN)  . Diuretic-induced hypokalemia 10/08/2018  . DVT of lower extremity (deep venous thrombosis) (HCC)    recurrent. bilateral (2 episodes)  . Dyspnea    home o2 when needed  . Dysrhythmia    afib with cabg  . Exertional angina (HCC)    Treated with Isosorbide, Ranexa, amlodipine; intolerant to metoprolol  . GERD (gastroesophageal reflux disease)   . Gout    "on daily RX" (10/06/2018)  . HLD (hyperlipidemia)   . Hypertension   . Hyperthyroidism   . L Subclavian Artery Stenosis    Carotid US 7/21   . Obesity (BMI 30-39.9) 2009   BMI 33  . Osteoarthritis    "all over; hands" (10/06/2018)  . Oxygen deficiency   . Oxygen dependent    4L at home as needed (10/06/2018)  . Pneumonia   . PVD (  peripheral vascular disease) ([place])   . Sleep apnea 2012   USED CPAP THEN  STARTED USING SPIRIVA , Nov. 2013- last evaluation , changed from mask to aparatus that is just for her nose.. ; reports 05-28-18 "the mask smothers me so i dont use it right now" (10/06/2018)    Patient Active Problem List   Diagnosis Date Noted  . CAD (coronary artery disease) of bypass graft 02/07/2021  . CHF (congestive heart failure) ([place]) 02/06/2021  . CHF (congestive heart failure), NYHA class III, acute, diastolic ([place]) Q000111Q  . Normocytic anemia 10/28/2020  . Acute respiratory failure with hypoxia ([place]) 10/09/2020  . Hypotension 10/09/2020  .  CAP (community acquired pneumonia) 10/09/2020  . L Subclavian Artery Stenosis   . S/P hip replacement, left 02/07/2020  . History of pulmonary embolism 10/04/2019  . Dyspnea 12/24/2018  . Cellulitis 12/24/2018  . Otitis media 12/24/2018  . Diuretic-induced hypokalemia 10/08/2018  . Acute on chronic diastolic heart failure ([place]) 10/06/2018  . Hematuria 04/01/2018  . Acute lower UTI 04/01/2018  . Hematoma of abdominal wall, subsequent encounter 04/01/2018  . Chronic diastolic heart failure ([place]) 03/29/2018  . GERD without esophagitis 03/29/2018  . Chronic gout 03/29/2018  . Chronic bilateral low back pain without sciatica 03/29/2018  . Colitis 03/10/2018  . AKI (acute kidney injury) ([place])   . Ascites   . Pulmonary embolism ([place]) 03/01/2018  . 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[place]) 03/01/2018  . Acute on chronic respiratory failure with hypoxia ([place]) 03/01/2018  . Angiodysplasia of intestine   . Hematochezia   . Malnutrition of moderate degree 02/15/2017  . Blood loss anemia 02/13/2017  . Impingement syndrome of left shoulder 02/10/2013  . PAD (peripheral artery disease) ([place]) 11/17/2011  . Dyslipidemia 10/17/2011  . Coronary artery disease involving native coronary artery of native heart with angina pectoris ([place])   . Hypothyroidism 05/03/2008  . Essential hypertension 05/03/2008  . Sleep apnea 05/03/2008  . COPD (chronic obstructive pulmonary disease) ([place]) 04/12/2008    Past Surgical History:  Procedure Laterality Date  . ANKLE SURGERY  2004  . ANTERIOR CERVICAL DECOMP/DISCECTOMY FUSION  11/2007  . AUGMENTATION MAMMAPLASTY    . [place]   lower; another scheduled, opt. reports 4 back- lumbar, 3 cerv. fusions  for later 2009  . CARDIAC CATHETERIZATION  11/2009   Patent LIMA to LAD and patent SVG to OM1. Occluded SVG to ramus and diagonal. Left main: 90% ostial stenosis, LCX 60-70% proximal stenosis  . CATARACT EXTRACTION W/ INTRAOCULAR LENS  IMPLANT, BILATERAL  Bilateral 2006  . COLONOSCOPY WITH PROPOFOL N/A 02/17/2017   Procedure: COLONOSCOPY WITH PROPOFOL;  Surgeon: Jerene Bears, MD;  Location[facility] END[facility]COPY;  Service: Endoscopy;  Laterality: N/A;  . CORONARY ANGIOPLASTY WITH STENT PLACEMENT  11/2009   Drug eluting stent to left main artery: 4.0 X 12 mm Ion   . CORONARY ARTERY BYPASS GRAFT  2004   "CABG X4"  . ESOPHAGOGASTRODUODENOSCOPY N/A 02/14/2017   Procedure: ESOPHAGOGASTRODUODENOSCOPY (EGD);  Surgeon: Doran Stabler, MD;  Location[facility] ENDOSCOPY;  Service: Endoscopy;  Laterality: N/A;  . GIVENS CAPSULE STUDY N/A 02/15/2017   Procedure: GIVENS CAPSULE STUDY;  Surgeon: Doran Stabler, MD;  Location: [facility];  Service: Endoscopy;  Laterality: N/A;  . GROIN MASS OPEN BIOPSY  2004  . IR IVC FILTER PLMT / S&I /IMG GUID/MOD SED  03/14/2018  . IR PARACENTESIS  03/10/2018  . JOINT REPLACEMENT    . LAPAROSCOPIC CHOLECYSTECTOMY    .  PERIPHERAL VASCULAR INTERVENTION Left 03/05/2018   Procedure: PERIPHERAL VASCULAR INTERVENTION;  Surgeon: Elam Dutch, MD;  Location: [facility] [place] CV LAB;  Service: Cardiovascular;  Laterality: Left;  Attempted unsuccess\ful Per Dr. Eden Lathe  . PERIPHERAL VASCULAR INTERVENTION  03/08/2018   Procedure: PERIPHERAL VASCULAR INTERVENTION;  Surgeon: Waynetta Sandy, MD;  Location: [facility] CV LAB;  Service: Cardiovascular;;  SMA Stent   . REPLACEMENT TOTAL KNEE BILATERAL Bilateral 2012  . REVERSE SHOULDER ARTHROPLASTY  02/26/2012   Procedure: REVERSE SHOULDER ARTHROPLASTY;  Surgeon: Marin Shutter, MD;  Location: [facility];  Service: Orthopedics;  Laterality: Right;  RIGHT SHOULDER REVERSED ARTHROPLASTY  . SHOULDER ARTHROSCOPY WITH SUBACROMIAL DECOMPRESSION Left 02/10/2013   Procedure: SHOULDER ARTHROSCOPY WITH SUBACROMIAL DECOMPRESSION DISTAL CLAVICLE RESECTION;  Surgeon: Marin Shutter, MD;  Location: [facility];  Service: Orthopedics;  Laterality: Left;  DISTAL CLAVICLE RESECTION  . TONSILLECTOMY    . TOTAL HIP  ARTHROPLASTY  2007   Right  . TOTAL HIP ARTHROPLASTY Left 02/07/2020   Procedure: TOTAL HIP ARTHROPLASTY ANTERIOR APPROACH;  Surgeon: Paralee Cancel, MD;  Location: [facility];  Service: Orthopedics;  Laterality: Left;  70 mins  . TRANSURETHRAL RESECTION OF BLADDER TUMOR N/A 05/31/2018   Procedure: TRANSURETHRAL RESECTION OF BLADDER TUMOR (TURBT);  Surgeon: Lucas Mallow, MD;  Location: [facility] ORS;  Service: Urology;  Laterality: N/A;  . VISCERAL ANGIOGRAPHY N/A 03/05/2018   Procedure: VISCERAL ANGIOGRAPHY;  Surgeon: Elam Dutch, MD;  Location: [facility] CV LAB;  Service: Cardiovascular;  Laterality: N/A;  . VISCERAL ANGIOGRAPHY N/A 03/08/2018   Procedure: VISCERAL ANGIOGRAPHY;  Surgeon: Waynetta Sandy, MD;  Location: [place] [facility] CV LAB;  Service: Cardiovascular;  Laterality: N/A;     OB History   No obstetric history on file.     Family History  Problem Relation Age of Onset  . Ovarian cancer Mother   . Lung cancer Father   . COPD Father   . Heart disease Father   . Heart disease Sister   . Stomach cancer Sister   . Colitis Sister     Social History   Tobacco Use  . Smoking status: Former Smoker    Packs/day: 1.00    Years: 45.00    Pack years: 45.00    Types: Cigarettes    Quit date: 12/22/2000    Years since quitting: 20.3  . Smokeless tobacco: Never Used  Vaping Use  . Vaping Use: Never used  Substance Use Topics  . Alcohol use: Never    Alcohol/week: 0.0 standard drinks  . Drug use: Never    Home Medications Prior to Admission medications   Medication Sig Start Date End Date Taking? Authorizing Provider  allopurinol (ZYLOPRIM) 100 MG tablet Take 100 mg by mouth daily with breakfast. 01/30/17   [provider]  ARIPiprazole (ABILIFY) 2 MG tablet Take 1 mg by mouth daily with breakfast. 12/29/19   [provider]  aspirin EC 81 MG tablet Take 81 mg by mouth daily with breakfast. Swallow whole.    [provider]   Aspirin-Salicylamide-Caffeine (BC HEADACHE POWDER PO) Take 1 packet by mouth 2 (two) times daily as needed (severe headache).    [provider]  atorvastatin (LIPITOR) 20 MG tablet Take 20 mg by mouth daily with supper. 09/21/20   [provider]  cholecalciferol (VITAMIN D3) 25 MCG (1000 UNIT) tablet Take 1,000 Units by mouth daily with supper.    [provider]  citalopram (CELEXA) 40 MG tablet Take 40  mg by mouth daily with supper. 08/30/19   [provider]  cyclobenzaprine (FLEXERIL) 10 MG tablet Take 10 mg by mouth 2 (two) times daily.    [provider]  ferrous sulfate 325 (65 FE) MG tablet Take 325 mg by mouth daily with breakfast.    [provider]  furosemide (LASIX) 40 MG tablet Take 1 tablet (40 mg total) by mouth daily. Patient taking differently: Take 40 mg by mouth daily. 02/08/21   Almyra Deforest, PA  gabapentin (NEURONTIN) 100 MG capsule Take 100 mg by mouth daily with breakfast. 01/09/20   [provider]  Ipratropium-Albuterol (COMBIVENT RESPIMAT) 20-100 MCG/ACT AERS respimat Inhale 1 puff into the lungs 3 (three) times daily as needed for wheezing or shortness of breath.    [provider]  levothyroxine (SYNTHROID) 50 MCG tablet Take 1 tablet (50 mcg total) by mouth daily at 6 (six) AM. 09/13/20   Fay Records, MD  magnesium oxide (MAG-OX) 400 (241.3 Mg) MG tablet Take 1 tablet (400 mg total) by mouth 2 (two) times daily. 03/26/18   Mikhail, Velta Addison, DO  Melatonin 10 MG TABS Take 30 mg by mouth at bedtime.    [provider]  nitroGLYCERIN (NITROSTAT) 0.4 MG SL tablet Place 1 tablet (0.4 mg total) under the tongue every 5 (five) minutes as needed for chest pain. For chest pain 04/24/16   Fay Records, MD  omeprazole (PRILOSEC) 40 MG capsule TAKE 1 CAPSULE BY MOUTH 2 TIMES DAILY. 12/20/20   Esterwood, Amy S, PA-C  OXYGEN Inhale 4 L/min into the lungs as needed (DURING ALL TIMES OF EXERTION).     [provider]  pilocarpine (PILOCAR) 1 % ophthalmic solution Place 1 drop into both eyes 2 (two) times daily.    [provider]  potassium chloride SA (KLOR-CON) 20 MEQ tablet Take 20 mEq by mouth daily with lunch.    [provider]  pramipexole (MIRAPEX) 0.25 MG tablet Take 0.25 mg by mouth daily with supper. 08/22/20   [provider]  roflumilast (DALIRESP) 500 MCG TABS tablet Take 500 mcg by mouth daily with breakfast.    [provider]  spironolactone (ALDACTONE) 25 MG tablet Take 0.5 tablets (12.5 mg total) by mouth daily. 02/12/21   Katherine Roan, MD  traZODone (DESYREL) 50 MG tablet Take 150 mg by mouth at bedtime.  10/07/18   [provider]  vitamin C (ASCORBIC ACID) 500 MG tablet Take 500 mg by mouth daily with lunch.    [provider]  XTAMPZA ER 9 MG C12A Take 9 mg by mouth 2 (two) times daily. 09/25/20   [provider]    Allergies    Ativan [lorazepam], Pitavastatin, Ropinirole, Zofran [ondansetron hcl], Zolpidem tartrate, and Penicillins  Review of Systems   Review of Systems  All other systems reviewed and are negative.   Physical Exam Updated Vital Signs BP (!) 143/63   Pulse 99   Temp (!) 100.6 F (38.1 C) (Rectal)   Resp 20   Ht 1.727 m (5\' 8" )   Wt 78.5 kg   SpO2 96%   BMI 26.30 kg/m   Physical Exam Vitals and nursing note reviewed.  Constitutional:      General: She is in acute distress.     Appearance: She is well-developed. She is ill-appearing.  HENT:     Head: Normocephalic and atraumatic.     Mouth/Throat:     Pharynx: No oropharyngeal exudate.  Eyes:  General: No scleral icterus.       Right eye: No discharge.        Left eye: No discharge.     Conjunctiva/sclera: Conjunctivae normal.     Pupils: Pupils are equal, round, and reactive to light.  Neck:     Thyroid: No thyromegaly.     Vascular: No JVD.  Cardiovascular:     Rate and Rhythm: Regular rhythm. Tachycardia  present.     Heart sounds: Normal heart sounds. No murmur heard. No friction rub. No gallop.   Pulmonary:     Effort: Respiratory distress present.     Breath sounds: Wheezing, rhonchi and rales present.     Comments: The patient has a prolonged expiratory phase, increased work of breathing, speaks in shortened sentences, is in obvious respiratory distress Abdominal:     General: Bowel sounds are normal. There is no distension.     Palpations: Abdomen is soft. There is no mass.     Tenderness: There is no abdominal tenderness.  Musculoskeletal:        General: No tenderness. Normal range of motion.     Cervical back: Normal range of motion and neck supple.     Right lower leg: Edema present.     Left lower leg: Edema present.  Lymphadenopathy:     Cervical: No cervical adenopathy.  Skin:    General: Skin is warm and dry.     Findings: Erythema and rash present.     Comments: Bilateral lower extremities with stasis dermatitis and mild edema  Neurological:     Mental Status: She is alert.     Coordination: Coordination normal.  Psychiatric:        Behavior: Behavior normal.     ED Results / Procedures / Treatments   Labs (all labs ordered are listed, but only abnormal results are displayed) Labs Reviewed  COMPREHENSIVE METABOLIC PANEL - Abnormal; Notable for the following components:      Result Value   Glucose, Bld 124 (*)    Creatinine, Ser 1.10 (*)    Calcium 8.3 (*)    GFR, Estimated 51 (*)    All other components within normal limits  CBC WITH DIFFERENTIAL/PLATELET - Abnormal; Notable for the following components:   Hemoglobin 11.4 (*)    MCHC 29.5 (*)    RDW 16.1 (*)    All other components within normal limits  RESP PANEL BY RT-PCR (FLU A&B, COVID) ARPGX2  CULTURE, BLOOD (ROUTINE X 2)  CULTURE, BLOOD (ROUTINE X 2)  URINE CULTURE  PROTIME-INR  APTT  LACTIC ACID, PLASMA  LACTIC ACID, PLASMA  URINALYSIS, ROUTINE W REFLEX MICROSCOPIC    EKG EKG  Interpretation  Date/Time:  Monday Apr 29 2021 18:59:08 EDT Ventricular Rate:  105 PR Interval:  203 QRS Duration: 106 QT Interval:  379 QTC Calculation: 501 R Axis:   -65 Text Interpretation: Sinus tachycardia with irregular rate Left anterior fascicular block Abnormal R-wave progression, late transition Probable left ventricular hypertrophy Prolonged QT interval Confirmed by Eber HongMiller, Trevis Eden ([id number]) on 04/29/2021 7:25:17 PM   Radiology DG Chest Port 1 View  Result Date: 04/29/2021 CLINICAL DATA:  Shortness of breath and weight gain EXAM: PORTABLE CHEST 1 VIEW COMPARISON:  02/05/2021 FINDINGS: Cardiac shadow is mildly prominent but stable. Postsurgical changes are again seen. Lungs are well aerated bilaterally. Increased vascular congestion is noted without significant edema. No focal infiltrate is noted. Degenerative changes of the thoracic spine are noted. IMPRESSION: Vascular congestion consistent with mild CHF.  Electronically Signed   By: Inez Catalina M.D.   On: 04/29/2021 19:59    Procedures .Critical Care Performed by: Noemi Chapel, MD Authorized by: Noemi Chapel, MD   Critical care provider statement:    Critical care time (minutes):  35   Critical care time was exclusive of:  Separately billable procedures and treating other patients and teaching time   Critical care was necessary to treat or prevent imminent or life-threatening deterioration of the following conditions:  Sepsis and respiratory failure   Critical care was time spent personally by me on the following activities:  Blood draw for specimens, development of treatment plan with patient or surrogate, discussions with consultants, evaluation of patient's response to treatment, examination of patient, obtaining history from patient or surrogate, ordering and performing treatments and interventions, ordering and review of laboratory studies, ordering and review of radiographic studies, pulse oximetry, re-evaluation of patient's  condition and review of old charts     Medications Ordered in ED Medications  lactated ringers infusion ( Intravenous New Bag/Given 04/29/21 2124)  albuterol (PROVENTIL,VENTOLIN) solution continuous neb (has no administration in time range)  cefTRIAXone (ROCEPHIN) 2 g in sodium chloride 0.9 % 100 mL IVPB (0 g Intravenous Stopped 04/29/21 2005)  azithromycin (ZITHROMAX) 500 mg in sodium chloride 0.9 % 250 mL IVPB (0 mg Intravenous Stopped 04/29/21 2054)  acetaminophen (TYLENOL) tablet 1,000 mg (has no administration in time range)  albuterol (PROVENTIL,VENTOLIN) solution continuous neb (10 mg/hr Nebulization New Bag/Given 04/29/21 2110)  sodium chloride 0.9 % bolus 1,000 mL (0 mLs Intravenous Stopped 04/29/21 2124)  albuterol (VENTOLIN HFA) 108 (90 Base) MCG/ACT inhaler 2 puff (2 puffs Inhalation Given 04/29/21 1931)  methylPREDNISolone sodium succinate (SOLU-MEDROL) 125 mg/2 mL injection 125 mg (125 mg Intravenous Given 04/29/21 1923)    ED Course  I have reviewed the triage vital signs and the nursing notes.  Pertinent labs & imaging results that were available during my care of the patient were reviewed by me and considered in my medical decision making (see chart for details).    MDM Rules/Calculators/A&P                          This ill-appearing patient in respiratory distress likely has an element of COPD but with a fever and tachycardia will be treated for sepsis, antibiotics ordered to cover for pulmonary pathogens, given her allergy to penicillin antibiotics Levaquin was ordered according to the ED antibiotics order set.  She will need multiple breathing treatments, steroids, chest x-ray, blood cultures, she is critically ill and will likely need to be admitted to the hospital to a  High level of care.  Review of the medical record shows that her last echocardiogram was performed October 09, 2020 showed an ejection fraction of 60 to 65%, no regional wall abnormalities with moderate  concentric LVH.  The patient has what appears to be severe COPD with respiratory distress, critical care offered for ongoing respiratory therapy treatments, antibiotics for fever and tachycardia, she meets sepsis criteria.  Discussed with the hospitalist will admit.  Final Clinical Impression(s) / ED Diagnoses Final diagnoses:  Sepsis, due to unspecified organism, unspecified whether acute organ dysfunction present [facility])  Respiratory distress    Rx / DC Orders ED Discharge Orders    None       Noemi Chapel, MD 04/29/21 2151

## 2021-04-30 ENCOUNTER — Telehealth: Payer: Self-pay | Admitting: Internal Medicine

## 2021-04-30 ENCOUNTER — Observation Stay (HOSPITAL_COMMUNITY): Payer: Medicare HMO

## 2021-04-30 ENCOUNTER — Encounter (HOSPITAL_COMMUNITY): Payer: Self-pay | Admitting: Internal Medicine

## 2021-04-30 DIAGNOSIS — E039 Hypothyroidism, unspecified: Secondary | ICD-10-CM | POA: Diagnosis present

## 2021-04-30 DIAGNOSIS — Z951 Presence of aortocoronary bypass graft: Secondary | ICD-10-CM | POA: Diagnosis not present

## 2021-04-30 DIAGNOSIS — J441 Chronic obstructive pulmonary disease with (acute) exacerbation: Secondary | ICD-10-CM | POA: Diagnosis present

## 2021-04-30 DIAGNOSIS — R0603 Acute respiratory distress: Secondary | ICD-10-CM | POA: Diagnosis present

## 2021-04-30 DIAGNOSIS — Z66 Do not resuscitate: Secondary | ICD-10-CM | POA: Diagnosis present

## 2021-04-30 DIAGNOSIS — Z955 Presence of coronary angioplasty implant and graft: Secondary | ICD-10-CM | POA: Diagnosis not present

## 2021-04-30 DIAGNOSIS — J9621 Acute and chronic respiratory failure with hypoxia: Secondary | ICD-10-CM | POA: Diagnosis present

## 2021-04-30 DIAGNOSIS — I739 Peripheral vascular disease, unspecified: Secondary | ICD-10-CM | POA: Diagnosis present

## 2021-04-30 DIAGNOSIS — Z96653 Presence of artificial knee joint, bilateral: Secondary | ICD-10-CM | POA: Diagnosis present

## 2021-04-30 DIAGNOSIS — Z9981 Dependence on supplemental oxygen: Secondary | ICD-10-CM | POA: Diagnosis not present

## 2021-04-30 DIAGNOSIS — Z981 Arthrodesis status: Secondary | ICD-10-CM | POA: Diagnosis not present

## 2021-04-30 DIAGNOSIS — G8929 Other chronic pain: Secondary | ICD-10-CM | POA: Diagnosis present

## 2021-04-30 DIAGNOSIS — A419 Sepsis, unspecified organism: Secondary | ICD-10-CM | POA: Diagnosis present

## 2021-04-30 DIAGNOSIS — I2510 Atherosclerotic heart disease of native coronary artery without angina pectoris: Secondary | ICD-10-CM | POA: Diagnosis present

## 2021-04-30 DIAGNOSIS — M1A9XX0 Chronic gout, unspecified, without tophus (tophi): Secondary | ICD-10-CM | POA: Diagnosis present

## 2021-04-30 DIAGNOSIS — K219 Gastro-esophageal reflux disease without esophagitis: Secondary | ICD-10-CM | POA: Diagnosis present

## 2021-04-30 DIAGNOSIS — Z9049 Acquired absence of other specified parts of digestive tract: Secondary | ICD-10-CM | POA: Diagnosis not present

## 2021-04-30 DIAGNOSIS — F32A Depression, unspecified: Secondary | ICD-10-CM | POA: Diagnosis present

## 2021-04-30 DIAGNOSIS — I110 Hypertensive heart disease with heart failure: Secondary | ICD-10-CM | POA: Diagnosis present

## 2021-04-30 DIAGNOSIS — R0602 Shortness of breath: Secondary | ICD-10-CM | POA: Diagnosis not present

## 2021-04-30 DIAGNOSIS — J449 Chronic obstructive pulmonary disease, unspecified: Secondary | ICD-10-CM | POA: Diagnosis not present

## 2021-04-30 DIAGNOSIS — Z20822 Contact with and (suspected) exposure to covid-19: Secondary | ICD-10-CM | POA: Diagnosis present

## 2021-04-30 DIAGNOSIS — F419 Anxiety disorder, unspecified: Secondary | ICD-10-CM | POA: Diagnosis present

## 2021-04-30 DIAGNOSIS — I5033 Acute on chronic diastolic (congestive) heart failure: Secondary | ICD-10-CM | POA: Diagnosis present

## 2021-04-30 DIAGNOSIS — E785 Hyperlipidemia, unspecified: Secondary | ICD-10-CM | POA: Diagnosis present

## 2021-04-30 DIAGNOSIS — Z86718 Personal history of other venous thrombosis and embolism: Secondary | ICD-10-CM | POA: Diagnosis not present

## 2021-04-30 DIAGNOSIS — Z86711 Personal history of pulmonary embolism: Secondary | ICD-10-CM | POA: Diagnosis not present

## 2021-04-30 LAB — COMPREHENSIVE METABOLIC PANEL
ALT: 14 U/L (ref 0–44)
AST: 36 U/L (ref 15–41)
Albumin: 3.2 g/dL — ABNORMAL LOW (ref 3.5–5.0)
Alkaline Phosphatase: 62 U/L (ref 38–126)
Anion gap: 12 (ref 5–15)
BUN: 18 mg/dL (ref 8–23)
CO2: 26 mmol/L (ref 22–32)
Calcium: 8.1 mg/dL — ABNORMAL LOW (ref 8.9–10.3)
Chloride: 102 mmol/L (ref 98–111)
Creatinine, Ser: 1.26 mg/dL — ABNORMAL HIGH (ref 0.44–1.00)
GFR, Estimated: 43 mL/min — ABNORMAL LOW (ref >60)
Glucose, Bld: 188 mg/dL — ABNORMAL HIGH (ref 70–99)
Potassium: 3.4 mmol/L — ABNORMAL LOW (ref 3.5–5.1)
Sodium: 140 mmol/L (ref 135–145)
Total Bilirubin: 0.1 mg/dL — ABNORMAL LOW (ref 0.3–1.2)
Total Protein: 6.3 g/dL — ABNORMAL LOW (ref 6.5–8.1)

## 2021-04-30 LAB — MAGNESIUM: Magnesium: 2.4 mg/dL (ref 1.7–2.4)

## 2021-04-30 LAB — CBC
HCT: 37.2 % (ref 36.0–46.0)
Hemoglobin: 11.2 g/dL — ABNORMAL LOW (ref 12.0–15.0)
MCH: 27.4 pg (ref 26.0–34.0)
MCHC: 30.1 g/dL (ref 30.0–36.0)
MCV: 91 fL (ref 80.0–100.0)
Platelets: 191 10*3/uL (ref 150–400)
RBC: 4.09 MIL/uL (ref 3.87–5.11)
RDW: 16.2 % — ABNORMAL HIGH (ref 11.5–15.5)
WBC: 5.4 10*3/uL (ref 4.0–10.5)
nRBC: 0 % (ref 0.0–0.2)

## 2021-04-30 LAB — PROCALCITONIN: Procalcitonin: <0.1 ng/mL

## 2021-04-30 LAB — BRAIN NATRIURETIC PEPTIDE: B Natriuretic Peptide: 683.1 pg/mL — ABNORMAL HIGH (ref 0.0–100.0)

## 2021-04-30 LAB — LACTIC ACID, PLASMA
Lactic Acid, Venous: 5.7 mmol/L (ref 0.5–1.9)
Lactic Acid, Venous: 3.9 mmol/L (ref 0.5–1.9)

## 2021-04-30 MED ORDER — POTASSIUM CHLORIDE CRYS ER 20 MEQ PO TBCR
40.0000 meq | EXTENDED_RELEASE_TABLET | Freq: Once | ORAL | Status: AC
  Administered 2021-04-30: 40 meq via ORAL
  Filled 2021-04-30: qty 2

## 2021-04-30 MED ORDER — FUROSEMIDE 10 MG/ML IJ SOLN
60.0000 mg | Freq: Once | INTRAMUSCULAR | Status: AC
  Administered 2021-04-30: 60 mg via INTRAVENOUS
  Filled 2021-04-30: qty 6

## 2021-04-30 MED ORDER — METHYLPREDNISOLONE SODIUM SUCC 125 MG IJ SOLR
60.0000 mg | Freq: Two times a day (BID) | INTRAMUSCULAR | Status: DC
  Administered 2021-04-30 – 2021-05-03 (×7): 60 mg via INTRAVENOUS
  Filled 2021-04-30 (×7): qty 2

## 2021-04-30 NOTE — Telephone Encounter (Signed)
Informed Dr. Ross.  

## 2021-04-30 NOTE — Evaluation (Signed)
Occupational Therapy Evaluation Patient Details Name: [name] MRN: [id number] DOB: 18-Nov-1941 Today's Date: 04/30/2021    History of Present Illness Pt is an 80 y/o female admitted secondary to increased SOB. Thought to be secondary to COPD and CHF exacerbation. PMH includes HTN, PAD, CHF, and CAD.   Clinical Impression   *Pt PTA: Pt living alone and independent. Pt currently reports her granddaughter can stay with her at d/c and can help her with anything she needs. Pt minguardA for mobility/transfers with RW and fatigues very quickly. Pt supervisionA for ADL. Pt reports her granddaughter can stay with her at d/c and can help her with anything she needs. Pt would benefit from continued OT skilled services. OT following acutely.    Follow Up Recommendations  Home health OT;Supervision - Intermittent    Equipment Recommendations  3 in 1 bedside commode    Recommendations for Other Services       Precautions / Restrictions Precautions Precautions: Fall Restrictions Weight Bearing Restrictions: No      Mobility Bed Mobility               General bed mobility comments: pt in recliner pre and post session    Transfers Overall transfer level: Needs assistance Equipment used: Rolling walker (2 wheeled) Transfers: Sit to/from [facility] Sit to Stand: Min guard Stand pivot transfers: Min guard       General transfer comment: Min guard for safety to stand and transfer to [facility] <-> recliner    Balance Overall balance assessment: Needs assistance Sitting-balance support: No upper extremity supported;Feet supported Sitting balance-Leahy Scale: Fair     Standing balance support: Bilateral upper extremity supported;During functional activity Standing balance-Leahy Scale: Poor Standing balance comment: Reliant on single UE for toilet hygiene                           ADL either performed or assessed with clinical judgement   ADL  Overall ADL's : At baseline                                       General ADL Comments: Pt performing ADL at supervisionA level, but requires frequent seated rest breaks. Pts' O2 >90% , but SOB with minimal exertion. Pt standing at [facility] for toilet hygiene with [place].     Vision Baseline Vision/History: No visual deficits Patient Visual Report: No change from baseline Vision Assessment?: No apparent visual deficits     Perception     Praxis      Pertinent Vitals/Pain Pain Assessment: No/denies pain     Hand Dominance Right   Extremity/Trunk Assessment Upper Extremity Assessment Upper Extremity Assessment: Overall WFL for tasks assessed   Lower Extremity Assessment Lower Extremity Assessment: Generalized weakness   Cervical / Trunk Assessment Cervical / Trunk Assessment: Normal   Communication Communication Communication: No difficulties   Cognition Arousal/Alertness: Awake/alert Behavior During Therapy: WFL for tasks assessed/performed Overall Cognitive Status: Within Functional Limits for tasks assessed                                     General Comments  Pt reports her granddaughter can stay with her at d/c and can help her with anything she needs.    Exercises     Shoulder Instructions  Home Living Family/patient expects to be discharged to:: Private residence Living Arrangements: Alone Available Help at Discharge: Family;Available 24 hours/day Type of Home: House Home Access: Stairs to enter [employer] of Steps: 3 Entrance Stairs-Rails: Can reach both;Right;Left Home Layout: One level     Bathroom Shower/Tub: Tub/shower unit;Walk-in shower   Bathroom Toilet: Standard     Home Equipment: Cane - single point;Wheelchair - manual;Shower seat;Bedside Designer, fashion/clothing - 4 wheels   Additional Comments: on 4L O2 at baseline      Prior Functioning/Environment Level of Independence:  Independent with assistive device(s)        Comments: Uses rollator for mobility tasks        OT Problem List: Decreased activity tolerance;Cardiopulmonary status limiting activity;Increased edema      OT Treatment/Interventions: Self-care/ADL training;Therapeutic exercise;Energy conservation;DME and/or AE instruction;Therapeutic activities;Balance training;Patient/family education    OT Goals(Current goals can be found in the care plan section) Acute Rehab OT Goals Patient Stated Goal: to feel better and go home OT Goal Formulation: With patient Time For Goal Achievement: 05/14/21 Potential to Achieve Goals: Good ADL Goals Additional ADL Goal #1: Pt will state 3 energy conservation techniques for OOB ADL/mobility to increase independence. Additional ADL Goal #2: Pt will increase to standing x5 mins for OOB ADL with O2 >90% throughout exertion.  OT Frequency: Min 2X/week   Barriers to D/C:            Co-evaluation              AM-PAC OT "6 Clicks" Daily Activity     Outcome Measure Help from another person eating meals?: None Help from another person taking care of personal grooming?: A Little Help from another person toileting, which includes using toliet, bedpan, or urinal?: A Little Help from another person bathing (including washing, rinsing, drying)?: A Little Help from another person to put on and taking off regular upper body clothing?: None Help from another person to put on and taking off regular lower body clothing?: A Little 6 Click Score: 20   End of Session Equipment Utilized During Treatment: Rolling walker;Oxygen Nurse Communication: Mobility status  Activity Tolerance: Patient limited by fatigue Patient left: in chair;with call bell/phone within reach  OT Visit Diagnosis: Unsteadiness on feet (R26.81);Muscle weakness (generalized) (M62.81)                Time: 6384-6659 OT Time Calculation (min): 20 min Charges:  OT General Charges $OT Visit: 1  Visit OT Evaluation $OT Eval Moderate Complexity: 1 Mod  Jefferey Pica, OTR/L Acute Rehabilitation Services Pager: [phone] Office: [phone]   Kasai Beltran C 04/30/2021, 6:04 PM

## 2021-04-30 NOTE — Progress Notes (Signed)
PROGRESS NOTE                                                                                                                                                                                                             Patient Demographics:    [name], is a 80 y.o. female, DOB - 1941/04/13, HYQ:[id number]  Outpatient Primary MD for the patient is [name], [name], MD    LOS - 0  Admit date - 04/29/2021    Chief Complaint  Patient presents with  . Code Sepsis       Brief Narrative (HPI from H&P) - [name] is a 80 y.o. female with medical history significant of CHF, CAD, GERD, angiodysplasia of intestine, chronic low back pain, gout, COPD, hyperlipidemia, hypertension, history of PE, hypothyroidism, PAD, left subclavian artery stenosis, anemia, OSA who presents with shortness of breath. She was diagnosed with acute on chronic hypoxic respiratory failure due to COPD exacerbation and CHF exacerbation.   Subjective:    [name] today has, No headache, No chest pain, No abdominal pain - No Nausea, No new weakness tingling or numbness, +ve SOB.   Assessment  & Plan :     1. Acute on Chronic Hypoxic Resp. Failure due to acute on chronic COPD exacerbation and acute on chronic diastolic CHF exacerbation.  65% on recent echo - she has been placed on IV steroids and Lasix, Nebs, o2 as needed - she uses 3-4 Lit o2 at baseline, encouraged the patient to sit up in chair in the daytime use I-S and flutter valve for pulmonary toiletry.  Will advance activity and titrate down oxygen as possible.   Recent Labs  Lab 04/29/21 1835 04/29/21 1900 04/29/21 2055 04/30/21 0428 04/30/21 0647  WBC  --  7.1  --  5.4  --   HGB  --  11.4*  --  11.2*  --   HCT  --  38.7  --  37.2  --   PLT  --  207  --  191  --   BNP  --   --   --  683.1*  --   PROCALCITON  --   --   --  <0.10  --   AST  --  19  --  36  --   ALT  --  12  --  14   --   ALKPHOS  --  64  --  62  --   BILITOT  --  0.5  --  0.1*  --   ALBUMIN  --  3.5  --  3.2*  --   INR  --  1.1  --   --   --   LATICACIDVEN  --   --  1.8 5.7* 3.9*  SARSCOV2NAA NEGATIVE  --   --   --   --     2. CAD, PAD - History of CABG in 2004 and DES in 2010 and 2012, on ASA, statin, no acute issues.  3. Hyperlipidemia - on  Atorvastatin.  4. Chronic pain - Continue home Xtampza ER.  5. Anxiety & Depression - Continue home Abilify, trazodone.  6. Restless leg - Continue home pramipexole.  7. GERD - Has a history of GI bleed, continue home PPI.  8. Gout - Continue home allopurinol.  9. Hypothyroidism - Continue home Synthroid.  10. OSA - Continue home oxygen.         Condition - Guarded  Family Communication  : Bronwen Betters [phone] On 05/01/2028    Code Status :  DNR  Consults  :  PCCM  PUD Prophylaxis : PPI   Procedures  :      TTE 09/2020 -   1. Diastology suggestive of impaired LV filling. Left ventricular ejection fraction, by estimation, is 60 to 65%. The left ventricle has normal function. The left ventricle has no regional wall motion abnormalities. There is moderate concentric left ventricular hypertrophy. Left ventricular diastolic parameters are indeterminate.  2. Right ventricular systolic function is low normal. The right ventricular size is normal. There is normal pulmonary artery systolic pressure.  3. Left atrial size was moderately dilated.  4. Right atrial size was moderately dilated.  5. The mitral valve is myxomatous. No evidence of mitral valve regurgitation. The mean mitral valve gradient is 5.0 mmHg with average heart rate of 87 bpm. Moderate mitral annular calcification.  6. The aortic valve is tricuspid. Aortic valve regurgitation is not visualized.  7. The inferior vena cava is dilated in size with <50% respiratory variability, suggesting right atrial pressure of 15 mmHg.      Disposition Plan  :      Dispo: The patient is from: Home              Anticipated d/c is to: Home              Patient currently is not medically stable to d/c.   Difficult to place patient No  DVT Prophylaxis  :    enoxaparin (LOVENOX) injection 40 mg Start: 04/29/21 2200    Lab Results  Component Value Date   PLT 191 04/30/2021    Diet :  Diet Order            Diet heart healthy/carb modified Room service appropriate? Yes; Fluid consistency: Thin; Fluid restriction: 2000 mL Fluid  Diet effective now                  Inpatient Medications  Scheduled Meds: . allopurinol  100 mg Oral Q breakfast  . ARIPiprazole  1 mg Oral Q breakfast  . aspirin EC  81 mg Oral Q breakfast  . atorvastatin  20 mg Oral Q supper  . doxycycline  100 mg Oral Q12H  . enoxaparin (LOVENOX) injection  40 mg Subcutaneous Q24H  . ferrous sulfate  325 mg Oral  Q breakfast  . furosemide  60 mg Intravenous Once  . gabapentin  100 mg Oral Q breakfast  . ipratropium-albuterol  3 mL Nebulization Q6H WA  . levothyroxine  50 mcg Oral Q0600  . melatonin  10 mg Oral QHS  . methylPREDNISolone (SOLU-MEDROL) injection  60 mg Intravenous Q12H  . oxyCODONE  10 mg Oral BID  . pantoprazole  40 mg Oral Daily  . pilocarpine  1 drop Both Eyes BID  . pramipexole  0.25 mg Oral Q supper  . roflumilast  500 mcg Oral Q breakfast  . sodium chloride flush  3 mL Intravenous Q12H  . spironolactone  12.5 mg Oral Daily  . traZODone  150 mg Oral QHS   Continuous Infusions: PRN Meds:.acetaminophen **OR** [DISCONTINUED] acetaminophen, albuterol, nitroGLYCERIN, polyethylene glycol  Antibiotics  :    Anti-infectives (From admission, onward)   Start     Dose/Rate Route Frequency Ordered Stop   04/30/21 2100  azithromycin (ZITHROMAX) tablet 250 mg  Status:  Discontinued        250 mg Oral Daily 04/29/21 2158 04/29/21 2311   04/29/21 2315  doxycycline (VIBRA-TABS) tablet 100 mg  Status:  Discontinued        100 mg Oral  Once 04/29/21 2311 04/29/21  2311   04/29/21 2315  doxycycline (VIBRA-TABS) tablet 100 mg        100 mg Oral Every 12 hours 04/29/21 2311 05/04/21 2159   04/29/21 2000  cefTRIAXone (ROCEPHIN) 2 g in sodium chloride 0.9 % 100 mL IVPB  Status:  Discontinued        2 g 200 mL/hr over 30 Minutes Intravenous Every 24 hours 04/29/21 1906 04/29/21 2311   04/29/21 2000  azithromycin (ZITHROMAX) 500 mg in sodium chloride 0.9 % 250 mL IVPB  Status:  Discontinued        500 mg 250 mL/hr over 60 Minutes Intravenous Every 24 hours 04/29/21 1906 04/29/21 2201   04/29/21 1915  levofloxacin (LEVAQUIN) IVPB 750 mg  Status:  Discontinued        750 mg 100 mL/hr over 90 Minutes Intravenous  Once 04/29/21 1900 04/29/21 1906       Time Spent in minutes  30   Lala Lund M.D on 04/30/2021 at 10:12 AM  To page go to [email or web address]   [facility] -  Office  [phone]    See all Orders from today for further details    Objective:   Vitals:   04/30/21 0202 04/30/21 0411 04/30/21 0740 04/30/21 0919  BP:  115/84  (!) 111/57  Pulse:  (!) 120    Resp:    16  Temp:  98.3 F (36.8 C)  98.5 F (36.9 C)  TempSrc:  Oral  Oral  SpO2: 95% 96% 98% 95%  Weight:      Height:        Wt Readings from Last 3 Encounters:  04/29/21 78.5 kg  02/21/21 77.5 kg  02/12/21 75.8 kg     Intake/Output Summary (Last 24 hours) at 04/30/2021 1012 Last data filed at 04/30/2021 0902 Gross per 24 hour  Intake 2317.7 ml  Output --  Net 2317.7 ml     Physical Exam  Awake Alert, No new F.N deficits, Normal affect [place].AT,PERRAL Supple Neck,  No cervical lymphadenopathy appriciated.  Symmetrical Chest wall movement, moderate air movement bilaterally with expiratory wheezes and some crackles at the bases RRR,No Gallops,Rubs or new Murmurs, No Parasternal Heave +ve B.Sounds, Abd Soft, No tenderness, No organomegaly appriciated,  No rebound - guarding or rigidity. No Cyanosis, Clubbing or edema, No new Rash or bruise       Data Review:     CBC Recent Labs  Lab 04/29/21 1900 04/30/21 0428  WBC 7.1 5.4  HGB 11.4* 11.2*  HCT 38.7 37.2  PLT 207 191  MCV 92.1 91.0  MCH 27.1 27.4  MCHC 29.5* 30.1  RDW 16.1* 16.2*  LYMPHSABS 0.9  --   MONOABS 0.7  --   EOSABS 0.0  --   BASOSABS 0.1  --     Recent Labs  Lab 04/29/21 1900 04/29/21 2055 04/30/21 0428 04/30/21 0647  NA 138  --  140  --   K 4.5  --  3.4*  --   CL 100  --  102  --   CO2 32  --  26  --   GLUCOSE 124*  --  188*  --   BUN 14  --  18  --   CREATININE 1.10*  --  1.26*  --   CALCIUM 8.3*  --  8.1*  --   AST 19  --  36  --   ALT 12  --  14  --   ALKPHOS 64  --  62  --   BILITOT 0.5  --  0.1*  --   ALBUMIN 3.5  --  3.2*  --   MG  --   --  2.4  --   PROCALCITON  --   --  <0.10  --   LATICACIDVEN  --  1.8 5.7* 3.9*  INR 1.1  --   --   --   BNP  --   --  683.1*  --     ------------------------------------------------------------------------------------------------------------------ No results for input(s): CHOL, HDL, LDLCALC, TRIG, CHOLHDL, LDLDIRECT in the last 72 hours.  No results found for: HGBA1C ------------------------------------------------------------------------------------------------------------------ No results for input(s): TSH, T4TOTAL, T3FREE, THYROIDAB in the last 72 hours.  Invalid input(s): FREET3  Cardiac Enzymes No results for input(s): CKMB, TROPONINI, MYOGLOBIN in the last 168 hours.  Invalid input(s): CK ------------------------------------------------------------------------------------------------------------------    Component Value Date/Time   BNP 683.1 (H) 04/30/2021 0428    Micro Results Recent Results (from the past 240 hour(s))  Resp Panel by RT-PCR (Flu A&B, Covid) Nasopharyngeal Swab     Status: None   Collection Time: 04/29/21  6:35 PM   Specimen: Nasopharyngeal Swab; Nasopharyngeal(NP) swabs in vial transport medium  Result Value Ref Range Status   SARS Coronavirus 2 by RT PCR NEGATIVE NEGATIVE  Final    Comment: (NOTE) SARS-CoV-2 target nucleic acids are NOT DETECTED.  The SARS-CoV-2 RNA is generally detectable in upper respiratory specimens during the acute phase of infection. The lowest concentration of SARS-CoV-2 viral copies this assay can detect is 138 copies/mL. A negative result does not preclude SARS-Cov-2 infection and should not be used as the sole basis for treatment or other patient management decisions. A negative result may occur with  improper specimen collection/handling, submission of specimen other than nasopharyngeal swab, presence of viral mutation(s) within the areas targeted by this assay, and inadequate number of viral copies(<138 copies/mL). A negative result must be combined with clinical observations, patient history, and epidemiological information. The expected result is Negative.  Fact Sheet for Patients:  EntrepreneurPulse.com.au  Fact Sheet for Healthcare Providers:  IncredibleEmployment.be  This test is no t yet approved or cleared by the [place] FDA and  has been authorized for detection and/or diagnosis of SARS-CoV-2 by FDA under  an Emergency Use Authorization (EUA). This EUA will remain  in effect (meaning this test can be used) for the duration of the COVID-19 declaration under Section 564(b)(1) of the Act, 21 U.S.C.section 360bbb-3(b)(1), unless the authorization is terminated  or revoked sooner.       Influenza A by PCR NEGATIVE NEGATIVE Final   Influenza B by PCR NEGATIVE NEGATIVE Final    Comment: (NOTE) The Xpert Xpress SARS-CoV-2/FLU/RSV plus assay is intended as an aid in the diagnosis of influenza from Nasopharyngeal swab specimens and should not be used as a sole basis for treatment. Nasal washings and aspirates are unacceptable for Xpert Xpress SARS-CoV-2/FLU/RSV testing.  Fact Sheet for Patients: EntrepreneurPulse.com.au  Fact Sheet for Healthcare  Providers: IncredibleEmployment.be  This test is not yet approved or cleared by the [place] FDA and has been authorized for detection and/or diagnosis of SARS-CoV-2 by FDA under an Emergency Use Authorization (EUA). This EUA will remain in effect (meaning this test can be used) for the duration of the COVID-19 declaration under Section 564(b)(1) of the Act, 21 U.S.C. section 360bbb-3(b)(1), unless the authorization is terminated or revoked.  Performed at [facility], [place] [address], [place], [place] [address]   Blood Culture (routine x 2)     Status: None (Preliminary result)   Collection Time: 04/29/21  6:50 PM   Specimen: BLOOD  Result Value Ref Range Status   Specimen Description BLOOD LEFT ANTECUBITAL  Final   Special Requests   Final    BOTTLES DRAWN AEROBIC AND ANAEROBIC Blood Culture adequate volume   Culture   Final    NO GROWTH < 12 HOURS Performed at [facility], [place] [address], [place], [place] [address]    Report Status PENDING  Incomplete  Blood Culture (routine x 2)     Status: None (Preliminary result)   Collection Time: 04/29/21  6:55 PM   Specimen: BLOOD RIGHT HAND  Result Value Ref Range Status   Specimen Description BLOOD RIGHT HAND  Final   Special Requests   Final    BOTTLES DRAWN AEROBIC AND ANAEROBIC Blood Culture results may not be optimal due to an inadequate volume of blood received in culture bottles   Culture   Final    NO GROWTH < 12 HOURS Performed at [facility], [place] [address], [place], [place] [address]    Report Status PENDING  Incomplete    Radiology Reports DG Chest Port 1 View  Result Date: 04/30/2021 CLINICAL DATA:  80 year old female with shortness of breath.  COPD. EXAM: PORTABLE CHEST 1 VIEW COMPARISON:  Portable chest 04/29/2021 and earlier. FINDINGS: Portable AP upright view at 0805 hours. Larger lung volumes. Mild increased pulmonary interstitial markings appear stable compared to  February. No pneumothorax, pleural effusion or acute pulmonary opacity. Incidental calcified breast implants. Stable cardiac size and mediastinal contours. Prior CABG. Postoperative changes to the right shoulder, cervical spine, and lumbar spine. Negative visible bowel gas. IMPRESSION: Improved ventilation from yesterday. No acute cardiopulmonary abnormality now. Electronically Signed   By: Genevie Ann M.D.   On: 04/30/2021 08:35   DG Chest Port 1 View  Result Date: 04/29/2021 CLINICAL DATA:  Shortness of breath and weight gain EXAM: PORTABLE CHEST 1 VIEW COMPARISON:  02/05/2021 FINDINGS: Cardiac shadow is mildly prominent but stable. Postsurgical changes are again seen. Lungs are well aerated bilaterally. Increased vascular congestion is noted without significant edema. No focal infiltrate is noted. Degenerative changes of the thoracic spine are noted.  IMPRESSION: Vascular congestion consistent with mild CHF. Electronically Signed   By: Inez Catalina M.D.   On: 04/29/2021 19:59

## 2021-04-30 NOTE — Evaluation (Signed)
Physical Therapy Evaluation Patient Details Name: [name] MRN: [id number] DOB: 17-Aug-1941 Today's Date: 04/30/2021   History of Present Illness  Pt is an 80 y/o female admitted secondary to increased SOB. Thought to be secondary to COPD and CHF exacerbation. PMH includes HTN, PAD, CHF, and CAD.  Clinical Impression  Pt admitted secondary to problem above with deficits below. Pt requiring min guard A to stand and transfer to chair using RW. Reporting increased fatigue, so further mobility deferred. Pt reports granddaughter can assist as needed at d/c. Recommending HHPT at d/c to increase independence and safety with functional mobility. Will continue to follow acutely.     Follow Up Recommendations Home health PT;Supervision for mobility/OOB    Equipment Recommendations  None recommended by PT    Recommendations for Other Services       Precautions / Restrictions Precautions Precautions: Fall Restrictions Weight Bearing Restrictions: No      Mobility  Bed Mobility Overal bed mobility: Needs Assistance Bed Mobility: Supine to Sit     Supine to sit: Supervision;HOB elevated     General bed mobility comments: Supervision for safety.    Transfers Overall transfer level: Needs assistance Equipment used: Rolling walker (2 wheeled) Transfers: Sit to/from [facility] Sit to Stand: Min guard Stand pivot transfers: Min guard       General transfer comment: Min guard for safety to stand and transfer to chair. Pt with increased fatigue so further mobility limited. No overt LOB noted, however, pt reporting her legs felt weak.  Ambulation/Gait                Stairs            Wheelchair Mobility    Modified Rankin (Stroke Patients Only)       Balance Overall balance assessment: Needs assistance Sitting-balance support: No upper extremity supported;Feet supported Sitting balance-Leahy Scale: Fair     Standing balance support:  Bilateral upper extremity supported;During functional activity Standing balance-Leahy Scale: Poor Standing balance comment: Reliant on BUE support                             Pertinent Vitals/Pain Pain Assessment: No/denies pain    Home Living Family/patient expects to be discharged to:: Private residence Living Arrangements: Alone Available Help at Discharge: Family;Available 24 hours/day Type of Home: House Home Access: Stairs to enter Entrance Stairs-Rails: Can reach both;Right;Left Entrance Stairs-Number of Steps: 3 Home Layout: One level Home Equipment: Cane - single point;Wheelchair - manual;Shower seat;Bedside Designer, fashion/clothing - 4 wheels Additional Comments: on 4L O2 at baseline    Prior Function Level of Independence: Independent with assistive device(s)         Comments: Uses rollator for mobility tasks     Hand Dominance        Extremity/Trunk Assessment   Upper Extremity Assessment Upper Extremity Assessment: Defer to OT evaluation    Lower Extremity Assessment Lower Extremity Assessment: Generalized weakness    Cervical / Trunk Assessment Cervical / Trunk Assessment: Normal  Communication   Communication: No difficulties  Cognition Arousal/Alertness: Awake/alert Behavior During Therapy: WFL for tasks assessed/performed Overall Cognitive Status: Within Functional Limits for tasks assessed                                        General Comments General comments (skin integrity, edema,  etc.): Pt reports her granddaughter can stay with her at d/c and can help her with anything she needs.    Exercises     Assessment/Plan    PT Assessment Patient needs continued PT services  PT Problem List Decreased strength;Decreased balance;Decreased activity tolerance;Decreased mobility       PT Treatment Interventions DME instruction;Gait training;Functional mobility training;Therapeutic activities;Therapeutic  exercise;Stair training    PT Goals (Current goals can be found in the Care Plan section)  Acute Rehab PT Goals Patient Stated Goal: to feel better and go home PT Goal Formulation: With patient Time For Goal Achievement: 05/14/21 Potential to Achieve Goals: Good    Frequency Min 3X/week   Barriers to discharge        Co-evaluation               AM-PAC PT "6 Clicks" Mobility  Outcome Measure Help needed turning from your back to your side while in a flat bed without using bedrails?: A Little Help needed moving from lying on your back to sitting on the side of a flat bed without using bedrails?: A Little Help needed moving to and from a bed to a chair (including a wheelchair)?: A Little Help needed standing up from a chair using your arms (e.g., wheelchair or bedside chair)?: A Little Help needed to walk in hospital room?: A Little Help needed climbing 3-5 steps with a railing? : A Lot 6 Click Score: 17    End of Session Equipment Utilized During Treatment: Gait belt;Oxygen Activity Tolerance: Patient limited by fatigue Patient left: in chair;with call bell/phone within reach Nurse Communication: Mobility status PT Visit Diagnosis: Unsteadiness on feet (R26.81);Muscle weakness (generalized) (M62.81)    Time: 3267-1245 PT Time Calculation (min) (ACUTE ONLY): 13 min   Charges:   PT Evaluation $PT Eval Moderate Complexity: 1 Mod          Reuel Derby, PT, DPT  Acute Rehabilitation Services  Pager: [phone] Office: [phone]   Rudean Hitt 04/30/2021, 1:14 PM

## 2021-04-30 NOTE — Telephone Encounter (Signed)
Pt's granddaughter [name] called to let [facility] know that [name] is currently hospitalized at [facility]

## 2021-04-30 NOTE — Consult Note (Signed)
   [facility] Inpatient Consult   04/30/2021  Macayla Ekdahl 04/08/1941 [id number]  [place] [facility] [ACO] Patient: Josephine Igo HMO  Primary Care Provider: [name], MD, [facility]  Patient is currently active with [facility] for chronic disease management services.  Patient has been engaged by a [facility].  Our community based plan of care has focused on disease management and community resource support.    Plan: Follow up with [place] Coordinator and with Inpatient Transition Of Care [TOC] team member to make aware that [facility] following.   Of note, [facility] Care Management services does not replace or interfere with any services that are needed or arranged by inpatient [facility] care management team.  For additional questions or referrals please contact:  Natividad Brood, RN BSN [facility] Liaison  [phone] business mobile phone Toll free office [phone]  Fax number: [phone] [email or web address]

## 2021-04-30 NOTE — Telephone Encounter (Signed)
Pt admitted to hospital per granddaughter.

## 2021-04-30 NOTE — Plan of Care (Signed)
  Problem: Education: Goal: Knowledge of General Education information will improve Description: Including pain rating scale, medication(s)/side effects and non-pharmacologic comfort measures Outcome: Progressing   Problem: Coping: Goal: Level of anxiety will decrease Outcome: Progressing   

## 2021-05-01 ENCOUNTER — Ambulatory Visit: Payer: Self-pay

## 2021-05-01 LAB — CBC WITH DIFFERENTIAL/PLATELET
Abs Immature Granulocytes: 0.1 10*3/uL — ABNORMAL HIGH (ref 0.00–0.07)
Basophils Absolute: 0 10*3/uL (ref 0.0–0.1)
Basophils Relative: 0 %
Eosinophils Absolute: 0 10*3/uL (ref 0.0–0.5)
Eosinophils Relative: 0 %
HCT: 35.6 % — ABNORMAL LOW (ref 36.0–46.0)
Hemoglobin: 10.7 g/dL — ABNORMAL LOW (ref 12.0–15.0)
Immature Granulocytes: 1 %
Lymphocytes Relative: 5 %
Lymphs Abs: 0.8 10*3/uL (ref 0.7–4.0)
MCH: 26.9 pg (ref 26.0–34.0)
MCHC: 30.1 g/dL (ref 30.0–36.0)
MCV: 89.4 fL (ref 80.0–100.0)
Monocytes Absolute: 0.4 10*3/uL (ref 0.1–1.0)
Monocytes Relative: 2 %
Neutro Abs: 15.2 10*3/uL — ABNORMAL HIGH (ref 1.7–7.7)
Neutrophils Relative %: 92 %
Platelets: 204 10*3/uL (ref 150–400)
RBC: 3.98 MIL/uL (ref 3.87–5.11)
RDW: 16.1 % — ABNORMAL HIGH (ref 11.5–15.5)
WBC: 16.5 10*3/uL — ABNORMAL HIGH (ref 4.0–10.5)
nRBC: 0 % (ref 0.0–0.2)

## 2021-05-01 LAB — COMPREHENSIVE METABOLIC PANEL
ALT: 21 U/L (ref 0–44)
AST: 72 U/L — ABNORMAL HIGH (ref 15–41)
Albumin: 3.1 g/dL — ABNORMAL LOW (ref 3.5–5.0)
Alkaline Phosphatase: 54 U/L (ref 38–126)
Anion gap: 10 (ref 5–15)
BUN: 30 mg/dL — ABNORMAL HIGH (ref 8–23)
CO2: 26 mmol/L (ref 22–32)
Calcium: 8.1 mg/dL — ABNORMAL LOW (ref 8.9–10.3)
Chloride: 102 mmol/L (ref 98–111)
Creatinine, Ser: 1.12 mg/dL — ABNORMAL HIGH (ref 0.44–1.00)
GFR, Estimated: 50 mL/min — ABNORMAL LOW (ref >60)
Glucose, Bld: 150 mg/dL — ABNORMAL HIGH (ref 70–99)
Potassium: 4.5 mmol/L (ref 3.5–5.1)
Sodium: 138 mmol/L (ref 135–145)
Total Bilirubin: 0.3 mg/dL (ref 0.3–1.2)
Total Protein: 6 g/dL — ABNORMAL LOW (ref 6.5–8.1)

## 2021-05-01 LAB — PROCALCITONIN: Procalcitonin: <0.1 ng/mL

## 2021-05-01 LAB — MAGNESIUM: Magnesium: 2.3 mg/dL (ref 1.7–2.4)

## 2021-05-01 LAB — BRAIN NATRIURETIC PEPTIDE: B Natriuretic Peptide: 963.8 pg/mL — ABNORMAL HIGH (ref 0.0–100.0)

## 2021-05-01 MED ORDER — FUROSEMIDE 10 MG/ML IJ SOLN
40.0000 mg | Freq: Once | INTRAMUSCULAR | Status: AC
  Administered 2021-05-01: 40 mg via INTRAVENOUS
  Filled 2021-05-01: qty 4

## 2021-05-01 NOTE — Progress Notes (Signed)
Occupational Therapy Treatment Patient Details Name: [name] MRN: [id number] DOB: [birth year] Today's Date: 05/01/2021    History of present illness Pt is an 80 y/o female admitted secondary to increased SOB. Thought to be secondary to COPD and CHF exacerbation. PMH includes HTN, PAD, CHF, and CAD.   OT comments  Patient progressing toward goals. Treatment session with focus on self-care re-education and energy conservation. Patient continues to be limited by decreased cardiopulmonary endurance and generalized weakness requiring 3.5-4L O2 via [place]. DOE 3/4 with mild activity.   Patient on 5L O2 via [place] w/ SpO2 99% upon entry. MD decreased O2 to 3.5L. SpO2 92%-94% seated EOB at rest and HR 105bpm. W/ minimal activity SpO2 decreased to 81% (poor pleth) to 88% (good pleth) and HR 115. Patient with 4/4 DOE with transfer to and from [facility]. SpO2 returned to 94-96% after ~15-30 seconds.   Follow Up Recommendations  Home health OT;Supervision - Intermittent    Equipment Recommendations  3 in 1 bedside commode    Recommendations for Other Services      Precautions / Restrictions Precautions Precautions: Fall Precaution Comments: Monitor SpO2 Restrictions Weight Bearing Restrictions: No       Mobility Bed Mobility Overal bed mobility: Needs Assistance Bed Mobility: Sidelying to Sit     Supine to sit: Supervision;HOB elevated     General bed mobility comments: Supervision A. Coughing fit upon sitting at EOB.    Transfers Overall transfer level: Needs assistance Equipment used: Rolling walker (2 wheeled) Transfers: Sit to/from [facility] Sit to Stand: Min guard Stand pivot transfers: Min guard       General transfer comment: Min guard for steadying/safety with use of RW. Patient requires rest breaks 2/2 O2 desaturation with minimal activity.    Balance Overall balance assessment: Needs assistance Sitting-balance support: No upper extremity  supported;Feet supported Sitting balance-Leahy Scale: Fair     Standing balance support: Bilateral upper extremity supported;During functional activity Standing balance-Leahy Scale: Poor Standing balance comment: Reliant on at least single UE support during functional tasks in standing.                           ADL either performed or assessed with clinical judgement   ADL Overall ADL's : Needs assistance/impaired                         Toilet Transfer: Min Fish farm manager Details (indicate cue type and reason): Min guard with for steadying/safety with use of RW. Toileting- Water quality scientist and Hygiene: Min guard Toileting - Clothing Manipulation Details (indicate cue type and reason): Min guard for steadying during hygiene/clothing management.     Functional mobility during ADLs: Supervision/safety;Rolling walker General ADL Comments: Patient continues to be limted by decreased activity tolerance 2/2 cardiopulmonary issues. Patient quick to fatigue.     Vision       Perception     Praxis      Cognition Arousal/Alertness: Awake/alert Behavior During Therapy: WFL for tasks assessed/performed Overall Cognitive Status: Within Functional Limits for tasks assessed                                          Exercises     Shoulder Instructions       General Comments Patient on 5L O2 via [place] w/ SpO2  99% upon entry. MD decreased O2 to 3.5L. SpO2 92%-94% seated EOB at rest and HR 105bpm. W/ minimal activity SpO2 decreased to 81% (poor pleth) to 88% (good pleth) and HR 115. Patient with 4/4 DOE with transfer to and from [facility]. SpO2 returned to 94-96% after ~15-30 seconds.    Pertinent Vitals/ Pain       Pain Assessment: No/denies pain  Home Living                                          Prior Functioning/Environment              Frequency  Min 2X/week        Progress Toward Goals  OT  Goals(current goals can now be found in the care plan section)  Progress towards OT goals: Progressing toward goals  Acute Rehab OT Goals Patient Stated Goal: to feel better and go home OT Goal Formulation: With patient Time For Goal Achievement: 05/14/21 Potential to Achieve Goals: Good ADL Goals Additional ADL Goal #1: Pt will state 3 energy conservation techniques for OOB ADL/mobility to increase independence. Additional ADL Goal #2: Pt will increase to standing x5 mins for OOB ADL with O2 >90% throughout exertion.  Plan Discharge plan remains appropriate;Frequency remains appropriate    Co-evaluation                 AM-PAC OT "6 Clicks" Daily Activity     Outcome Measure   Help from another person eating meals?: None Help from another person taking care of personal grooming?: A Little Help from another person toileting, which includes using toliet, bedpan, or urinal?: A Little Help from another person bathing (including washing, rinsing, drying)?: A Little Help from another person to put on and taking off regular upper body clothing?: None Help from another person to put on and taking off regular lower body clothing?: A Little 6 Click Score: 20    End of Session Equipment Utilized During Treatment: Rolling walker;Oxygen  OT Visit Diagnosis: Unsteadiness on feet (R26.81);Muscle weakness (generalized) (M62.81)   Activity Tolerance Patient limited by fatigue   Patient Left in chair;with call bell/phone within reach   Nurse Communication Mobility status        Time: 2951-8841 OT Time Calculation (min): 27 min  Charges: OT General Charges $OT Visit: 1 Visit OT Treatments $Self Care/Home Management : 23-37 mins  Courtenay Hirth H. OTR/L Supplemental OT, Department of rehab services [phone]   Kijuana Ruppel R H. 05/01/2021, 10:06 AM

## 2021-05-01 NOTE — Plan of Care (Signed)

## 2021-05-01 NOTE — Progress Notes (Signed)
PROGRESS NOTE                                                                                                                                                                                                             Patient Demographics:    [name], is a 80 y.o. female, DOB - 10/26/1941, PI:[id number]  Outpatient Primary MD for the patient is [name], [name], MD    LOS - 1  Admit date - 04/29/2021    Chief Complaint  Patient presents with  . Code Sepsis       Brief Narrative (HPI from H&P) - [name] is a 80 y.o. female with medical history significant of CHF, CAD, GERD, angiodysplasia of intestine, chronic low back pain, gout, COPD, hyperlipidemia, hypertension, history of PE, hypothyroidism, PAD, left subclavian artery stenosis, anemia, OSA who presents with shortness of breath. She was diagnosed with acute on chronic hypoxic respiratory failure due to COPD exacerbation and CHF exacerbation.   Subjective:   Patient in bed, appears comfortable, denies any headache, no fever, no chest pain or pressure, much improved shortness of breath , no abdominal pain. No new focal weakness.    Assessment  & Plan :     1. Acute on Chronic Hypoxic Resp. Failure due to acute on chronic COPD exacerbation and acute on chronic diastolic CHF exacerbation.  65% on recent echo - she has been placed on IV steroids and IV Lasix, Nebs, o2 as needed - she uses 4 Lit o2 at baseline, encouraged the patient to sit up in chair in the daytime use I-S and flutter valve for pulmonary toiletry.  Will advance activity and titrate down oxygen as possible. Improved on 05/01/21.   SpO2: 96 % O2 Flow Rate (L/min): 4 L/min FiO2 (%): 40 %   Recent Labs  Lab 04/29/21 1835 04/29/21 1900 04/29/21 2055 04/30/21 0428 04/30/21 0647 05/01/21 0216  WBC  --  7.1  --  5.4  --  16.5*  HGB  --  11.4*  --  11.2*  --  10.7*  HCT  --  38.7  --   37.2  --  35.6*  PLT  --  207  --  191  --  204  BNP  --   --   --  683.1*  --  963.8*  PROCALCITON  --   --   --  <  0.10  --  <0.10  AST  --  19  --  36  --  72*  ALT  --  12  --  14  --  21  ALKPHOS  --  64  --  62  --  54  BILITOT  --  0.5  --  0.1*  --  0.3  ALBUMIN  --  3.5  --  3.2*  --  3.1*  INR  --  1.1  --   --   --   --   LATICACIDVEN  --   --  1.8 5.7* 3.9*  --   SARSCOV2NAA NEGATIVE  --   --   --   --   --     2. CAD, PAD - History of CABG in 2004 and DES in 2010 and 2012, on ASA, statin, no acute issues.  3. Hyperlipidemia - on  Atorvastatin.  4. Chronic pain - Continue home Xtampza ER.  5. Anxiety & Depression - Continue home Abilify, trazodone.  6. Restless leg - Continue home pramipexole.  7. GERD - Has a history of GI bleed, continue home PPI.  8. Gout - Continue home allopurinol.  9. Hypothyroidism - Continue home Synthroid.  10. OSA - Continue home oxygen.  11.  Mild acute on chronic diastolic heart failure EF 65%.  Repeat Lasix IV on 05/01/2021 and monitor.      Condition - Guarded  Family Communication  : Bronwen Betters [phone] On 05/01/2028    Code Status :  DNR  Consults  :  PCCM  PUD Prophylaxis : PPI   Procedures  :      TTE 09/2020 -   1. Diastology suggestive of impaired LV filling. Left ventricular ejection fraction, by estimation, is 60 to 65%. The left ventricle has normal function. The left ventricle has no regional wall motion abnormalities. There is moderate concentric left ventricular hypertrophy. Left ventricular diastolic parameters are indeterminate.  2. Right ventricular systolic function is low normal. The right ventricular size is normal. There is normal pulmonary artery systolic pressure.  3. Left atrial size was moderately dilated.  4. Right atrial size was moderately dilated.  5. The mitral valve is myxomatous. No evidence of mitral valve regurgitation. The mean mitral valve gradient is 5.0 mmHg with  average heart rate of 87 bpm. Moderate mitral annular calcification.  6. The aortic valve is tricuspid. Aortic valve regurgitation is not visualized.  7. The inferior vena cava is dilated in size with <50% respiratory variability, suggesting right atrial pressure of 15 mmHg.      Disposition Plan  :     Dispo: The patient is from: Home              Anticipated d/c is to: Home              Patient currently is not medically stable to d/c.   Difficult to place patient No  DVT Prophylaxis  :    enoxaparin (LOVENOX) injection 40 mg Start: 04/29/21 2200    Lab Results  Component Value Date   PLT 204 05/01/2021    Diet :  Diet Order            Diet heart healthy/carb modified Room service appropriate? Yes; Fluid consistency: Thin; Fluid restriction: 2000 mL Fluid  Diet effective now                  Inpatient Medications  Scheduled Meds: . allopurinol  100 mg  Oral Q breakfast  . ARIPiprazole  1 mg Oral Q breakfast  . aspirin EC  81 mg Oral Q breakfast  . atorvastatin  20 mg Oral Q supper  . doxycycline  100 mg Oral Q12H  . enoxaparin (LOVENOX) injection  40 mg Subcutaneous Q24H  . ferrous sulfate  325 mg Oral Q breakfast  . gabapentin  100 mg Oral Q breakfast  . ipratropium-albuterol  3 mL Nebulization Q6H WA  . levothyroxine  50 mcg Oral Q0600  . melatonin  10 mg Oral QHS  . methylPREDNISolone (SOLU-MEDROL) injection  60 mg Intravenous Q12H  . oxyCODONE  10 mg Oral BID  . pantoprazole  40 mg Oral Daily  . pilocarpine  1 drop Both Eyes BID  . pramipexole  0.25 mg Oral Q supper  . roflumilast  500 mcg Oral Q breakfast  . sodium chloride flush  3 mL Intravenous Q12H  . spironolactone  12.5 mg Oral Daily  . traZODone  150 mg Oral QHS   Continuous Infusions: PRN Meds:.acetaminophen **OR** [DISCONTINUED] acetaminophen, albuterol, nitroGLYCERIN, polyethylene glycol  Antibiotics  :    Anti-infectives (From admission, onward)   Start     Dose/Rate Route Frequency  Ordered Stop   04/30/21 2100  azithromycin (ZITHROMAX) tablet 250 mg  Status:  Discontinued        250 mg Oral Daily 04/29/21 2158 04/29/21 2311   04/29/21 2315  doxycycline (VIBRA-TABS) tablet 100 mg  Status:  Discontinued        100 mg Oral  Once 04/29/21 2311 04/29/21 2311   04/29/21 2315  doxycycline (VIBRA-TABS) tablet 100 mg        100 mg Oral Every 12 hours 04/29/21 2311 05/04/21 2159   04/29/21 2000  cefTRIAXone (ROCEPHIN) 2 g in sodium chloride 0.9 % 100 mL IVPB  Status:  Discontinued        2 g 200 mL/hr over 30 Minutes Intravenous Every 24 hours 04/29/21 1906 04/29/21 2311   04/29/21 2000  azithromycin (ZITHROMAX) 500 mg in sodium chloride 0.9 % 250 mL IVPB  Status:  Discontinued        500 mg 250 mL/hr over 60 Minutes Intravenous Every 24 hours 04/29/21 1906 04/29/21 2201   04/29/21 1915  levofloxacin (LEVAQUIN) IVPB 750 mg  Status:  Discontinued        750 mg 100 mL/hr over 90 Minutes Intravenous  Once 04/29/21 1900 04/29/21 1906       Time Spent in minutes  30   Lala Lund M.D on 05/01/2021 at 10:59 AM  To page go to [email or web address]   [facility] -  Office  [phone]    See all Orders from today for further details    Objective:   Vitals:   05/01/21 0140 05/01/21 0515 05/01/21 0814 05/01/21 0939  BP:  (!) 118/58  114/60  Pulse:  63 92 96  Resp:   18 15  Temp:  97.7 F (36.5 C)  98.1 F (36.7 C)  TempSrc:  Oral    SpO2: 97% 100% 98% 96%  Weight:      Height:        Wt Readings from Last 3 Encounters:  04/29/21 78.5 kg  02/21/21 77.5 kg  02/12/21 75.8 kg     Intake/Output Summary (Last 24 hours) at 05/01/2021 1059 Last data filed at 05/01/2021 0336 Gross per 24 hour  Intake --  Output 400 ml  Net -400 ml     Physical Exam  Awake Alert, No  new F.N deficits, Normal affect .AT,PERRAL Supple Neck,No JVD, No cervical lymphadenopathy appriciated.  Symmetrical Chest wall movement,Mod air movement bilaterally, mild wheezing RRR,No  Gallops, Rubs or new Murmurs, No Parasternal Heave +ve B.Sounds, Abd Soft, No tenderness, No organomegaly appriciated, No rebound - guarding or rigidity. No Cyanosis, Clubbing or edema, No new Rash or bruise     Data Review:    CBC Recent Labs  Lab 04/29/21 1900 04/30/21 0428 05/01/21 0216  WBC 7.1 5.4 16.5*  HGB 11.4* 11.2* 10.7*  HCT 38.7 37.2 35.6*  PLT 207 191 204  MCV 92.1 91.0 89.4  MCH 27.1 27.4 26.9  MCHC 29.5* 30.1 30.1  RDW 16.1* 16.2* 16.1*  LYMPHSABS 0.9  --  0.8  MONOABS 0.7  --  0.4  EOSABS 0.0  --  0.0  BASOSABS 0.1  --  0.0    Recent Labs  Lab 04/29/21 1900 04/29/21 2055 04/30/21 0428 04/30/21 0647 05/01/21 0216  NA 138  --  140  --  138  K 4.5  --  3.4*  --  4.5  CL 100  --  102  --  102  CO2 32  --  26  --  26  GLUCOSE 124*  --  188*  --  150*  BUN 14  --  18  --  30*  CREATININE 1.10*  --  1.26*  --  1.12*  CALCIUM 8.3*  --  8.1*  --  8.1*  AST 19  --  36  --  72*  ALT 12  --  14  --  21  ALKPHOS 64  --  62  --  54  BILITOT 0.5  --  0.1*  --  0.3  ALBUMIN 3.5  --  3.2*  --  3.1*  MG  --   --  2.4  --  2.3  PROCALCITON  --   --  <0.10  --  <0.10  LATICACIDVEN  --  1.8 5.7* 3.9*  --   INR 1.1  --   --   --   --   BNP  --   --  683.1*  --  963.8*    ------------------------------------------------------------------------------------------------------------------ No results for input(s): CHOL, HDL, LDLCALC, TRIG, CHOLHDL, LDLDIRECT in the last 72 hours.  No results found for: HGBA1C ------------------------------------------------------------------------------------------------------------------ No results for input(s): TSH, T4TOTAL, T3FREE, THYROIDAB in the last 72 hours.  Invalid input(s): FREET3  Cardiac Enzymes No results for input(s): CKMB, TROPONINI, MYOGLOBIN in the last 168 hours.  Invalid input(s): CK ------------------------------------------------------------------------------------------------------------------     Component Value Date/Time   BNP 963.8 (H) 05/01/2021 0216    Micro Results Recent Results (from the past 240 hour(s))  Resp Panel by RT-PCR (Flu A&B, Covid) Nasopharyngeal Swab     Status: None   Collection Time: 04/29/21  6:35 PM   Specimen: Nasopharyngeal Swab; Nasopharyngeal(NP) swabs in vial transport medium  Result Value Ref Range Status   SARS Coronavirus 2 by RT PCR NEGATIVE NEGATIVE Final    Comment: (NOTE) SARS-CoV-2 target nucleic acids are NOT DETECTED.  The SARS-CoV-2 RNA is generally detectable in upper respiratory specimens during the acute phase of infection. The lowest concentration of SARS-CoV-2 viral copies this assay can detect is 138 copies/mL. A negative result does not preclude SARS-Cov-2 infection and should not be used as the sole basis for treatment or other patient management decisions. A negative result may occur with  improper specimen collection/handling, submission of specimen other than nasopharyngeal swab, presence of viral mutation(s)  within the areas targeted by this assay, and inadequate number of viral copies(<138 copies/mL). A negative result must be combined with clinical observations, patient history, and epidemiological information. The expected result is Negative.  Fact Sheet for Patients:  EntrepreneurPulse.com.au  Fact Sheet for Healthcare Providers:  IncredibleEmployment.be  This test is no t yet approved or cleared by the [place] FDA and  has been authorized for detection and/or diagnosis of SARS-CoV-2 by FDA under an Emergency Use Authorization (EUA). This EUA will remain  in effect (meaning this test can be used) for the duration of the COVID-19 declaration under Section 564(b)(1) of the Act, 21 U.S.C.section 360bbb-3(b)(1), unless the authorization is terminated  or revoked sooner.       Influenza A by PCR NEGATIVE NEGATIVE Final   Influenza B by PCR NEGATIVE NEGATIVE Final     Comment: (NOTE) The Xpert Xpress SARS-CoV-2/FLU/RSV plus assay is intended as an aid in the diagnosis of influenza from Nasopharyngeal swab specimens and should not be used as a sole basis for treatment. Nasal washings and aspirates are unacceptable for Xpert Xpress SARS-CoV-2/FLU/RSV testing.  Fact Sheet for Patients: EntrepreneurPulse.com.au  Fact Sheet for Healthcare Providers: IncredibleEmployment.be  This test is not yet approved or cleared by the [place] FDA and has been authorized for detection and/or diagnosis of SARS-CoV-2 by FDA under an Emergency Use Authorization (EUA). This EUA will remain in effect (meaning this test can be used) for the duration of the COVID-19 declaration under Section 564(b)(1) of the Act, 21 U.S.C. section 360bbb-3(b)(1), unless the authorization is terminated or revoked.  Performed at [facility], [place] [address] [place], [place] [address]   Blood Culture (routine x 2)     Status: None (Preliminary result)   Collection Time: 04/29/21  6:50 PM   Specimen: BLOOD  Result Value Ref Range Status   Specimen Description BLOOD LEFT ANTECUBITAL  Final   Special Requests   Final    BOTTLES DRAWN AEROBIC AND ANAEROBIC Blood Culture adequate volume   Culture   Final    NO GROWTH 2 DAYS Performed at [facility], [place] [address] [place], [place] [address]    Report Status PENDING  Incomplete  Blood Culture (routine x 2)     Status: None (Preliminary result)   Collection Time: 04/29/21  6:55 PM   Specimen: BLOOD RIGHT HAND  Result Value Ref Range Status   Specimen Description BLOOD RIGHT HAND  Final   Special Requests   Final    BOTTLES DRAWN AEROBIC AND ANAEROBIC Blood Culture results may not be optimal due to an inadequate volume of blood received in culture bottles   Culture   Final    NO GROWTH 2 DAYS Performed at [facility], [place] [address] [place], [place] [address]    Report  Status PENDING  Incomplete    Radiology Reports DG Chest Port 1 View  Result Date: 04/30/2021 CLINICAL DATA:  80 year old female with shortness of breath.  COPD. EXAM: PORTABLE CHEST 1 VIEW COMPARISON:  Portable chest 04/29/2021 and earlier. FINDINGS: Portable AP upright view at 0805 hours. Larger lung volumes. Mild increased pulmonary interstitial markings appear stable compared to February. No pneumothorax, pleural effusion or acute pulmonary opacity. Incidental calcified breast implants. Stable cardiac size and mediastinal contours. Prior CABG. Postoperative changes to the right shoulder, cervical spine, and lumbar spine. Negative visible bowel gas. IMPRESSION: Improved ventilation from yesterday. No acute cardiopulmonary abnormality now. Electronically Signed   By: Lemmie Evens  [place] Crane M.D.   On: 04/30/2021 08:35   DG Chest Port 1 View  Result Date: 04/29/2021 CLINICAL DATA:  Shortness of breath and weight gain EXAM: PORTABLE CHEST 1 VIEW COMPARISON:  02/05/2021 FINDINGS: Cardiac shadow is mildly prominent but stable. Postsurgical changes are again seen. Lungs are well aerated bilaterally. Increased vascular congestion is noted without significant edema. No focal infiltrate is noted. Degenerative changes of the thoracic spine are noted. IMPRESSION: Vascular congestion consistent with mild CHF. Electronically Signed   By: Inez Catalina M.D.   On: 04/29/2021 19:59

## 2021-05-01 NOTE — Progress Notes (Signed)
Physical Therapy Treatment Patient Details Name: [name] MRN: [id number] DOB: 30-Sep-1941 Today's Date: 05/01/2021    History of Present Illness Pt is an 80 y/o female admitted secondary to increased SOB. Thought to be secondary to COPD and CHF exacerbation. PMH includes HTN, PAD, CHF, and CAD.    PT Comments    Pt admitted with above diagnosis. Pt was able to ambulate with rollator with poor endurance/tolerance for activity with limited distance needing a seated rest break as well as needing incr O2 from 3Lto 6L and cues for pursed lip breathing. Will follow acutely.  Pt currently with functional limitations due to balance and endurance deficits. Pt will benefit from skilled PT to increase their independence and safety with mobility to allow discharge to the venue listed below.     Follow Up Recommendations  Home health PT;Supervision for mobility/OOB     Equipment Recommendations  None recommended by PT    Recommendations for Other Services       Precautions / Restrictions Precautions Precautions: Fall Precaution Comments: Monitor SpO2 Restrictions Weight Bearing Restrictions: No    Mobility  Bed Mobility Overal bed mobility: Needs Assistance Bed Mobility: Sidelying to Sit     Supine to sit: Supervision;HOB elevated     General bed mobility comments: in chair    Transfers Overall transfer level: Needs assistance Equipment used: Rolling walker (2 wheeled) Transfers: Sit to/from [facility] Sit to Stand: Min guard Stand pivot transfers: Min guard       General transfer comment: Min guard for steadying/safety with use of rollator. Patient requires rest breaks 2/2 O2 desaturation with minimal activity.  Ambulation/Gait Ambulation/Gait assistance: Min guard Gait Distance (Feet): 40 Feet (40 feet then 15 feet) Assistive device: 4-wheeled walker Gait Pattern/deviations: Step-through pattern;Decreased stride length;Trunk flexed;Drifts  right/left   Gait velocity interpretation: <1.31 ft/sec, indicative of household ambulator General Gait Details: Pt ambulated to hallway with rollator with good safety overall.  Needed a rest break therefore locked brakes and sat down. Pt was on 3L O2 and had to incr to 6L as pt took incr time to catch her breath with incr cues for pursed lip breathing as well.  Needed a 5 min rest break and actually wheeled pt closer to room while she was sitting on rollator so that she would only have to walk 15 feet back to chair.  Also left her on 6LO2 for the walk back and she did better with limited distance and incr O2.  Sats dipped to 85% after the first walk on 3L but on 6 sats 89% and greater with activity.   Stairs             Wheelchair Mobility    Modified Rankin (Stroke Patients Only)       Balance Overall balance assessment: Needs assistance Sitting-balance support: No upper extremity supported;Feet supported Sitting balance-Leahy Scale: Fair     Standing balance support: Bilateral upper extremity supported;During functional activity Standing balance-Leahy Scale: Poor Standing balance comment: Reliant on at least single UE support during functional tasks in standing.                            Cognition Arousal/Alertness: Awake/alert Behavior During Therapy: WFL for tasks assessed/performed Overall Cognitive Status: Within Functional Limits for tasks assessed  Exercises General Exercises - Lower Extremity Ankle Circles/Pumps: AROM;Both;10 reps;Seated Long Arc Quad: AROM;Both;10 reps;Seated    General Comments General comments (skin integrity, edema, etc.): Patient on 5L O2 via [place] w/ SpO2 99% upon entry. MD decreased O2 to 3.5L. SpO2 92%-94% seated EOB at rest and HR 105bpm. W/ minimal activity SpO2 decreased to 81% (poor pleth) to 88% (good pleth) and HR 115. Patient with 4/4 DOE with transfer to and from [facility].  SpO2 returned to 94-96% after ~15-30 seconds.      Pertinent Vitals/Pain Pain Assessment: No/denies pain    Home Living                      Prior Function            PT Goals (current goals can now be found in the care plan section) Acute Rehab PT Goals Patient Stated Goal: to feel better and go home Progress towards PT goals: Progressing toward goals    Frequency    Min 3X/week      PT Plan Current plan remains appropriate    Co-evaluation              AM-PAC PT "6 Clicks" Mobility   Outcome Measure  Help needed turning from your back to your side while in a flat bed without using bedrails?: A Little Help needed moving from lying on your back to sitting on the side of a flat bed without using bedrails?: A Little Help needed moving to and from a bed to a chair (including a wheelchair)?: A Little Help needed standing up from a chair using your arms (e.g., wheelchair or bedside chair)?: A Little Help needed to walk in hospital room?: A Little Help needed climbing 3-5 steps with a railing? : A Lot 6 Click Score: 17    End of Session Equipment Utilized During Treatment: Gait belt;Oxygen Activity Tolerance: Patient limited by fatigue Patient left: in chair;with call bell/phone within reach;with chair alarm set;with family/visitor present Nurse Communication: Mobility status PT Visit Diagnosis: Unsteadiness on feet (R26.81);Muscle weakness (generalized) (M62.81)     Time: 1017-5102 PT Time Calculation (min) (ACUTE ONLY): 19 min  Charges:  $Gait Training: 8-22 mins                     [name] M,PT Acute Rehab Services [phone] (pager)   [name] 05/01/2021, 12:51 PM

## 2021-05-01 NOTE — TOC Initial Note (Addendum)
Transition of Care [facility]) - Initial/Assessment Note    Patient Details  Name: [name] MRN: [id number] Date of Birth: 10-11-1941  Transition of Care [facility]) CM/SW Contact:    Joanne Chars, LCSW Phone Number: 05/01/2021, 3:37 PM  Clinical Narrative:      CSW met with pt to discuss discharge recommendation for [facility].  Pt reports she has home O2 through [facility] patient. Pt also reports that [facility] has recently discontinued coming to her home but is unable to remember who was coming.  Pt reports this company was not doing PT.  Permission given to speak with granddaughter Lilia Pro.  Choice document provide.  Pt is not vaccinated for covid.  Current equipment in the home: walker, bedside commode, shower chair.  PCP in place.  Per patient ping, [facility] has been providing services.  Cory at [facility] confirms pt recently discharged.  1545: CSW spoke with pt and she would like to resume services through [facility].             Expected Discharge Plan: [place] Barriers to Discharge: Continued Medical Work up   Patient Goals and CMS Choice Patient states their goals for this hospitalization and ongoing recovery are:: "go home" CMS Medicare.gov Compare Post Acute Care list provided to:: Patient Choice offered to / list presented to : Patient  Expected Discharge Plan and Services Expected Discharge Plan: [place] Acute Care Choice: [place] arrangements for the past 2 months: Single Family Home                                      Prior Living Arrangements/Services Living arrangements for the past 2 months: Single Family Home Lives with:: Self Patient language and need for interpreter reviewed:: Yes Do you feel safe going back to the place where you live?: Yes      Need for Family Participation in Patient Care: No (Comment) Care giver support system in place?: Yes (comment) Current home services: DME  ([place] patient for home O2, unclear if HH is currently involved) Criminal Activity/Legal Involvement Pertinent to Current Situation/Hospitalization: No - Comment as needed  Activities of Daily Living Home Assistive Devices/Equipment: Oxygen,Cane (specify quad or straight),Walker (specify type),Shower chair with back (straight cane, Four wheel walker) ADL Screening (condition at time of admission) Patient's cognitive ability adequate to safely complete daily activities?: Yes Is the patient deaf or have difficulty hearing?: No Does the patient have difficulty seeing, even when wearing glasses/contacts?: No Does the patient have difficulty concentrating, remembering, or making decisions?: Yes Patient able to express need for assistance with ADLs?: Yes Does the patient have difficulty dressing or bathing?: No Independently performs ADLs?: Yes (appropriate for developmental age) Does the patient have difficulty walking or climbing stairs?: Yes Weakness of Legs: Both Weakness of Arms/Hands: Both  Permission Sought/Granted Permission sought to share information with : Family Supports Permission granted to share information with : Yes, Verbal Permission Granted  Share Information with NAME: granddaughter Lilia Pro  Permission granted to share info w AGENCY: HH        Emotional Assessment Appearance:: Appears stated age Attitude/Demeanor/Rapport: Engaged Affect (typically observed): Appropriate,Pleasant Orientation: : Oriented to Self,Oriented to Place,Oriented to  Time,Oriented to Situation Alcohol / Substance Use: Not Applicable Psych Involvement: No (comment)  Admission diagnosis:  Respiratory distress [R06.03] COPD exacerbation ([place]) [J44.1] Sepsis,  due to unspecified organism, unspecified whether acute organ dysfunction present [facility]) [A41.9] Patient Active Problem List   Diagnosis Date Noted  . COPD exacerbation ([place]) 04/29/2021  . Prolonged QT interval 04/29/2021  .  Depression 04/29/2021  . RLS (restless legs syndrome) 04/29/2021  . CAD (coronary artery disease) of bypass graft 02/07/2021  . CHF (congestive heart failure) ([place]) 02/06/2021  . CHF (congestive heart failure), NYHA class III, acute, diastolic ([place]) 40/98/1191  . Normocytic anemia 10/28/2020  . Acute respiratory failure with hypoxia ([place]) 10/09/2020  . Hypotension 10/09/2020  . CAP (community acquired pneumonia) 10/09/2020  . L Subclavian Artery Stenosis   . S/P hip replacement, left 02/07/2020  . History of pulmonary embolism 10/04/2019  . Dyspnea 12/24/2018  . Cellulitis 12/24/2018  . Otitis media 12/24/2018  . Diuretic-induced hypokalemia 10/08/2018  . Acute on chronic diastolic heart failure ([place]) 10/06/2018  . Hematuria 04/01/2018  . Acute lower UTI 04/01/2018  . Hematoma of abdominal wall, subsequent encounter 04/01/2018  . Chronic diastolic heart failure ([place]) 03/29/2018  . GERD without esophagitis 03/29/2018  . Chronic gout 03/29/2018  . Chronic bilateral low back pain without sciatica 03/29/2018  . Colitis 03/10/2018  . AKI (acute kidney injury) ([place])   . Ascites   . Pulmonary embolism ([place]) 03/01/2018  . 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[place]) 03/01/2018  . Acute on chronic respiratory failure with hypoxia ([place]) 03/01/2018  . Angiodysplasia of intestine   . Hematochezia   . Malnutrition of moderate degree 02/15/2017  . Blood loss anemia 02/13/2017  . Impingement syndrome of left shoulder 02/10/2013  . PAD (peripheral artery disease) ([place]) 11/17/2011  . Dyslipidemia 10/17/2011  . Coronary artery disease involving native coronary artery of native heart with angina pectoris ([place])   . Hypothyroidism 05/03/2008  . Essential hypertension 05/03/2008  . Sleep apnea 05/03/2008  . COPD (chronic obstructive pulmonary disease) ([place]) 04/12/2008   PCP:  [name], MD Pharmacy:   [place], [place] [address] Phone: [phone] Fax: [place], [place] [address] Phone: [phone] Fax: [phone]     Social Determinants of Health (SDOH) Interventions    Readmission Risk Interventions No flowsheet data found.

## 2021-05-02 LAB — COMPREHENSIVE METABOLIC PANEL
ALT: 22 U/L (ref 0–44)
AST: 43 U/L — ABNORMAL HIGH (ref 15–41)
Albumin: 3.1 g/dL — ABNORMAL LOW (ref 3.5–5.0)
Alkaline Phosphatase: 52 U/L (ref 38–126)
Anion gap: 7 (ref 5–15)
BUN: 40 mg/dL — ABNORMAL HIGH (ref 8–23)
CO2: 30 mmol/L (ref 22–32)
Calcium: 9 mg/dL (ref 8.9–10.3)
Chloride: 99 mmol/L (ref 98–111)
Creatinine, Ser: 1.19 mg/dL — ABNORMAL HIGH (ref 0.44–1.00)
GFR, Estimated: 46 mL/min — ABNORMAL LOW (ref >60)
Glucose, Bld: 131 mg/dL — ABNORMAL HIGH (ref 70–99)
Potassium: 4.2 mmol/L (ref 3.5–5.1)
Sodium: 136 mmol/L (ref 135–145)
Total Bilirubin: 0.2 mg/dL — ABNORMAL LOW (ref 0.3–1.2)
Total Protein: 5.9 g/dL — ABNORMAL LOW (ref 6.5–8.1)

## 2021-05-02 LAB — CBC WITH DIFFERENTIAL/PLATELET
Abs Immature Granulocytes: 0.12 10*3/uL — ABNORMAL HIGH (ref 0.00–0.07)
Basophils Absolute: 0 10*3/uL (ref 0.0–0.1)
Basophils Relative: 0 %
Eosinophils Absolute: 0 10*3/uL (ref 0.0–0.5)
Eosinophils Relative: 0 %
HCT: 35.1 % — ABNORMAL LOW (ref 36.0–46.0)
Hemoglobin: 10.8 g/dL — ABNORMAL LOW (ref 12.0–15.0)
Immature Granulocytes: 1 %
Lymphocytes Relative: 6 %
Lymphs Abs: 1 10*3/uL (ref 0.7–4.0)
MCH: 27.1 pg (ref 26.0–34.0)
MCHC: 30.8 g/dL (ref 30.0–36.0)
MCV: 88 fL (ref 80.0–100.0)
Monocytes Absolute: 0.6 10*3/uL (ref 0.1–1.0)
Monocytes Relative: 4 %
Neutro Abs: 13.3 10*3/uL — ABNORMAL HIGH (ref 1.7–7.7)
Neutrophils Relative %: 89 %
Platelets: 213 10*3/uL (ref 150–400)
RBC: 3.99 MIL/uL (ref 3.87–5.11)
RDW: 16.2 % — ABNORMAL HIGH (ref 11.5–15.5)
WBC: 15 10*3/uL — ABNORMAL HIGH (ref 4.0–10.5)
nRBC: 0 % (ref 0.0–0.2)

## 2021-05-02 LAB — PROCALCITONIN: Procalcitonin: <0.1 ng/mL

## 2021-05-02 LAB — BRAIN NATRIURETIC PEPTIDE: B Natriuretic Peptide: 679.6 pg/mL — ABNORMAL HIGH (ref 0.0–100.0)

## 2021-05-02 LAB — MAGNESIUM: Magnesium: 2.4 mg/dL (ref 1.7–2.4)

## 2021-05-02 MED ORDER — FUROSEMIDE 10 MG/ML IJ SOLN
20.0000 mg | Freq: Once | INTRAMUSCULAR | Status: AC
  Administered 2021-05-02: 20 mg via INTRAVENOUS
  Filled 2021-05-02: qty 2

## 2021-05-02 NOTE — Progress Notes (Signed)
PROGRESS NOTE                                                                                                                                                                                                             Patient Demographics:    [name], is a 80 y.o. female, DOB - 06/30/1941, EPP:[id number]  Outpatient Primary MD for the patient is [name], [name], MD    LOS - 2  Admit date - 04/29/2021    Chief Complaint  Patient presents with  . Code Sepsis       Brief Narrative (HPI from H&P) - [name] is a 80 y.o. female with medical history significant of CHF, CAD, GERD, angiodysplasia of intestine, chronic low back pain, gout, COPD, hyperlipidemia, hypertension, history of PE, hypothyroidism, PAD, left subclavian artery stenosis, anemia, OSA who presents with shortness of breath. She was diagnosed with acute on chronic hypoxic respiratory failure due to COPD exacerbation and CHF exacerbation.   Subjective:   Patient in bed, appears comfortable, denies any headache, no fever, no chest pain or pressure, much improved shortness of breath , no abdominal pain. No new focal weakness.   Assessment  & Plan :     1. Acute on Chronic Hypoxic Resp. Failure due to acute on chronic COPD exacerbation and acute on chronic diastolic CHF exacerbation.  65% on recent echo - she has been placed on IV steroids and IV Lasix, Nebs, o2 as needed - she uses 4 Lit o2 at baseline, encouraged the patient to sit up in chair in the daytime use I-S and flutter valve for pulmonary toiletry.  Will advance activity and titrate down oxygen as possible. Improved on 05/01/21.   SpO2: 97 % O2 Flow Rate (L/min): 3 L/min FiO2 (%): 32 %   Recent Labs  Lab 04/29/21 1835 04/29/21 1900 04/29/21 2055 04/30/21 0428 04/30/21 0647 05/01/21 0216 05/02/21 0147  WBC  --  7.1  --  5.4  --  16.5* 15.0*  HGB  --  11.4*  --  11.2*  --  10.7* 10.8*   HCT  --  38.7  --  37.2  --  35.6* 35.1*  PLT  --  207  --  191  --  204 213  BNP  --   --   --  683.1*  --  963.8* 679.6*  PROCALCITON  --   --   --  <0.10  --  <0.10 <0.10  AST  --  19  --  36  --  72* 43*  ALT  --  12  --  14  --  21 22  ALKPHOS  --  64  --  62  --  54 52  BILITOT  --  0.5  --  0.1*  --  0.3 0.2*  ALBUMIN  --  3.5  --  3.2*  --  3.1* 3.1*  INR  --  1.1  --   --   --   --   --   LATICACIDVEN  --   --  1.8 5.7* 3.9*  --   --   SARSCOV2NAA NEGATIVE  --   --   --   --   --   --     2. CAD, PAD - History of CABG in 2004 and DES in 2010 and 2012, on ASA, statin, no acute issues.  3. Hyperlipidemia - on  Atorvastatin.  4. Chronic pain - Continue home Xtampza ER.  5. Anxiety & Depression - Continue home Abilify, trazodone.  6. Restless leg - Continue home pramipexole.  7. GERD - Has a history of GI bleed, continue home PPI.  8. Gout - Continue home allopurinol.  9. Hypothyroidism - Continue home Synthroid.  10. OSA - Continue home oxygen.  11.  Mild acute on chronic diastolic heart failure EF 65%.  Repeat Lasix IV on 05/01/2021 and monitor.      Condition - Guarded  Family Communication  : Bronwen Betters [phone] On 05/01/2021, 05/02/21  Code Status :  DNR  Consults  :  PCCM  PUD Prophylaxis : PPI   Procedures  :      TTE 09/2020 -   1. Diastology suggestive of impaired LV filling. Left ventricular ejection fraction, by estimation, is 60 to 65%. The left ventricle has normal function. The left ventricle has no regional wall motion abnormalities. There is moderate concentric left ventricular hypertrophy. Left ventricular diastolic parameters are indeterminate.  2. Right ventricular systolic function is low normal. The right ventricular size is normal. There is normal pulmonary artery systolic pressure.  3. Left atrial size was moderately dilated.  4. Right atrial size was moderately dilated.  5. The mitral valve is myxomatous. No  evidence of mitral valve regurgitation. The mean mitral valve gradient is 5.0 mmHg with average heart rate of 87 bpm. Moderate mitral annular calcification.  6. The aortic valve is tricuspid. Aortic valve regurgitation is not visualized.  7. The inferior vena cava is dilated in size with <50% respiratory variability, suggesting right atrial pressure of 15 mmHg.      Disposition Plan  :     Dispo: The patient is from: Home              Anticipated d/c is to: Home              Patient currently is not medically stable to d/c.   Difficult to place patient No  DVT Prophylaxis  :     Lab Results  Component Value Date   PLT 213 05/02/2021    Diet :  Diet Order            Diet heart healthy/carb modified Room service appropriate? Yes; Fluid consistency: Thin; Fluid restriction: 1500 mL Fluid  Diet effective now  Inpatient Medications  Scheduled Meds: . allopurinol  100 mg Oral Q breakfast  . ARIPiprazole  1 mg Oral Q breakfast  . aspirin EC  81 mg Oral Q breakfast  . atorvastatin  20 mg Oral Q supper  . doxycycline  100 mg Oral Q12H  . ferrous sulfate  325 mg Oral Q breakfast  . furosemide  20 mg Intravenous Once  . gabapentin  100 mg Oral Q breakfast  . ipratropium-albuterol  3 mL Nebulization Q6H WA  . levothyroxine  50 mcg Oral Q0600  . melatonin  10 mg Oral QHS  . methylPREDNISolone (SOLU-MEDROL) injection  60 mg Intravenous Q12H  . oxyCODONE  10 mg Oral BID  . pantoprazole  40 mg Oral Daily  . pilocarpine  1 drop Both Eyes BID  . pramipexole  0.25 mg Oral Q supper  . roflumilast  500 mcg Oral Q breakfast  . sodium chloride flush  3 mL Intravenous Q12H  . spironolactone  12.5 mg Oral Daily  . traZODone  150 mg Oral QHS   Continuous Infusions: PRN Meds:.acetaminophen **OR** [DISCONTINUED] acetaminophen, albuterol, nitroGLYCERIN, polyethylene glycol  Antibiotics  :    Anti-infectives (From admission, onward)   Start     Dose/Rate Route  Frequency Ordered Stop   04/30/21 2100  azithromycin (ZITHROMAX) tablet 250 mg  Status:  Discontinued        250 mg Oral Daily 04/29/21 2158 04/29/21 2311   04/29/21 2315  doxycycline (VIBRA-TABS) tablet 100 mg  Status:  Discontinued        100 mg Oral  Once 04/29/21 2311 04/29/21 2311   04/29/21 2315  doxycycline (VIBRA-TABS) tablet 100 mg        100 mg Oral Every 12 hours 04/29/21 2311 05/04/21 2159   04/29/21 2000  cefTRIAXone (ROCEPHIN) 2 g in sodium chloride 0.9 % 100 mL IVPB  Status:  Discontinued        2 g 200 mL/hr over 30 Minutes Intravenous Every 24 hours 04/29/21 1906 04/29/21 2311   04/29/21 2000  azithromycin (ZITHROMAX) 500 mg in sodium chloride 0.9 % 250 mL IVPB  Status:  Discontinued        500 mg 250 mL/hr over 60 Minutes Intravenous Every 24 hours 04/29/21 1906 04/29/21 2201   04/29/21 1915  levofloxacin (LEVAQUIN) IVPB 750 mg  Status:  Discontinued        750 mg 100 mL/hr over 90 Minutes Intravenous  Once 04/29/21 1900 04/29/21 1906       Time Spent in minutes  30   Lala Lund M.D on 05/02/2021 at 11:45 AM  To page go to [email or web address]   [facility] -  Office  [phone]    See all Orders from today for further details    Objective:   Vitals:   05/01/21 1947 05/01/21 2112 05/02/21 0600 05/02/21 0823  BP: 129/74  121/71   Pulse: 94  92 92  Resp: 20  18 (!) 22  Temp: 98.6 F (37 C)  98.4 F (36.9 C)   TempSrc: Oral  Oral   SpO2: 94% 95% 97%   Weight:      Height:        Wt Readings from Last 3 Encounters:  04/29/21 78.5 kg  02/21/21 77.5 kg  02/12/21 75.8 kg     Intake/Output Summary (Last 24 hours) at 05/02/2021 1145 Last data filed at 05/02/2021 0941 Gross per 24 hour  Intake 782 ml  Output 1600 ml  Net -818 ml  Physical Exam  Awake Alert, No new F.N deficits, Normal affect [place].AT,PERRAL Supple Neck,No JVD, No cervical lymphadenopathy appriciated.  Symmetrical Chest wall movement, Good air movement bilaterally, mild  wheezing RRR,No Gallops, Rubs or new Murmurs, No Parasternal Heave +ve B.Sounds, Abd Soft, No tenderness, No organomegaly appriciated, No rebound - guarding or rigidity. No Cyanosis, Clubbing or edema, No new Rash or bruise    Data Review:    CBC Recent Labs  Lab 04/29/21 1900 04/30/21 0428 05/01/21 0216 05/02/21 0147  WBC 7.1 5.4 16.5* 15.0*  HGB 11.4* 11.2* 10.7* 10.8*  HCT 38.7 37.2 35.6* 35.1*  PLT 207 191 204 213  MCV 92.1 91.0 89.4 88.0  MCH 27.1 27.4 26.9 27.1  MCHC 29.5* 30.1 30.1 30.8  RDW 16.1* 16.2* 16.1* 16.2*  LYMPHSABS 0.9  --  0.8 1.0  MONOABS 0.7  --  0.4 0.6  EOSABS 0.0  --  0.0 0.0  BASOSABS 0.1  --  0.0 0.0    Recent Labs  Lab 04/29/21 1900 04/29/21 2055 04/30/21 0428 04/30/21 0647 05/01/21 0216 05/02/21 0147  NA 138  --  140  --  138 136  K 4.5  --  3.4*  --  4.5 4.2  CL 100  --  102  --  102 99  CO2 32  --  26  --  26 30  GLUCOSE 124*  --  188*  --  150* 131*  BUN 14  --  18  --  30* 40*  CREATININE 1.10*  --  1.26*  --  1.12* 1.19*  CALCIUM 8.3*  --  8.1*  --  8.1* 9.0  AST 19  --  36  --  72* 43*  ALT 12  --  14  --  21 22  ALKPHOS 64  --  62  --  54 52  BILITOT 0.5  --  0.1*  --  0.3 0.2*  ALBUMIN 3.5  --  3.2*  --  3.1* 3.1*  MG  --   --  2.4  --  2.3 2.4  PROCALCITON  --   --  <0.10  --  <0.10 <0.10  LATICACIDVEN  --  1.8 5.7* 3.9*  --   --   INR 1.1  --   --   --   --   --   BNP  --   --  683.1*  --  963.8* 679.6*    ------------------------------------------------------------------------------------------------------------------ No results for input(s): CHOL, HDL, LDLCALC, TRIG, CHOLHDL, LDLDIRECT in the last 72 hours.  No results found for: HGBA1C ------------------------------------------------------------------------------------------------------------------ No results for input(s): TSH, T4TOTAL, T3FREE, THYROIDAB in the last 72 hours.  Invalid input(s): FREET3  Cardiac Enzymes No results for input(s): CKMB, TROPONINI,  MYOGLOBIN in the last 168 hours.  Invalid input(s): CK ------------------------------------------------------------------------------------------------------------------    Component Value Date/Time   BNP 679.6 (H) 05/02/2021 0147    Micro Results Recent Results (from the past 240 hour(s))  Resp Panel by RT-PCR (Flu A&B, Covid) Nasopharyngeal Swab     Status: None   Collection Time: 04/29/21  6:35 PM   Specimen: Nasopharyngeal Swab; Nasopharyngeal(NP) swabs in vial transport medium  Result Value Ref Range Status   SARS Coronavirus 2 by RT PCR NEGATIVE NEGATIVE Final    Comment: (NOTE) SARS-CoV-2 target nucleic acids are NOT DETECTED.  The SARS-CoV-2 RNA is generally detectable in upper respiratory specimens during the acute phase of infection. The lowest concentration of SARS-CoV-2 viral copies this assay can detect is 138 copies/mL. A  negative result does not preclude SARS-Cov-2 infection and should not be used as the sole basis for treatment or other patient management decisions. A negative result may occur with  improper specimen collection/handling, submission of specimen other than nasopharyngeal swab, presence of viral mutation(s) within the areas targeted by this assay, and inadequate number of viral copies(<138 copies/mL). A negative result must be combined with clinical observations, patient history, and epidemiological information. The expected result is Negative.  Fact Sheet for Patients:  EntrepreneurPulse.com.au  Fact Sheet for Healthcare Providers:  IncredibleEmployment.be  This test is no t yet approved or cleared by the [place] FDA and  has been authorized for detection and/or diagnosis of SARS-CoV-2 by FDA under an Emergency Use Authorization (EUA). This EUA will remain  in effect (meaning this test can be used) for the duration of the COVID-19 declaration under Section 564(b)(1) of the Act, 21 U.S.C.section  360bbb-3(b)(1), unless the authorization is terminated  or revoked sooner.       Influenza A by PCR NEGATIVE NEGATIVE Final   Influenza B by PCR NEGATIVE NEGATIVE Final    Comment: (NOTE) The Xpert Xpress SARS-CoV-2/FLU/RSV plus assay is intended as an aid in the diagnosis of influenza from Nasopharyngeal swab specimens and should not be used as a sole basis for treatment. Nasal washings and aspirates are unacceptable for Xpert Xpress SARS-CoV-2/FLU/RSV testing.  Fact Sheet for Patients: EntrepreneurPulse.com.au  Fact Sheet for Healthcare Providers: IncredibleEmployment.be  This test is not yet approved or cleared by the [place] FDA and has been authorized for detection and/or diagnosis of SARS-CoV-2 by FDA under an Emergency Use Authorization (EUA). This EUA will remain in effect (meaning this test can be used) for the duration of the COVID-19 declaration under Section 564(b)(1) of the Act, 21 U.S.C. section 360bbb-3(b)(1), unless the authorization is terminated or revoked.  Performed at [facility], [place] [address] [place], [place] [address]   Blood Culture (routine x 2)     Status: None (Preliminary result)   Collection Time: 04/29/21  6:50 PM   Specimen: BLOOD  Result Value Ref Range Status   Specimen Description BLOOD LEFT ANTECUBITAL  Final   Special Requests   Final    BOTTLES DRAWN AEROBIC AND ANAEROBIC Blood Culture adequate volume   Culture   Final    NO GROWTH 3 DAYS Performed at [facility], [place] [address], [place], [place] [address]    Report Status PENDING  Incomplete  Blood Culture (routine x 2)     Status: None (Preliminary result)   Collection Time: 04/29/21  6:55 PM   Specimen: BLOOD RIGHT HAND  Result Value Ref Range Status   Specimen Description BLOOD RIGHT HAND  Final   Special Requests   Final    BOTTLES DRAWN AEROBIC AND ANAEROBIC Blood Culture results may not be optimal due to an inadequate  volume of blood received in culture bottles   Culture   Final    NO GROWTH 3 DAYS Performed at [facility], [place] [address], [place], [place] [address]    Report Status PENDING  Incomplete    Radiology Reports DG Chest Port 1 View  Result Date: 04/30/2021 CLINICAL DATA:  80 year old female with shortness of breath.  COPD. EXAM: PORTABLE CHEST 1 VIEW COMPARISON:  Portable chest 04/29/2021 and earlier. FINDINGS: Portable AP upright view at 0805 hours. Larger lung volumes. Mild increased pulmonary interstitial markings appear stable compared to February. No pneumothorax, pleural effusion or acute pulmonary  opacity. Incidental calcified breast implants. Stable cardiac size and mediastinal contours. Prior CABG. Postoperative changes to the right shoulder, cervical spine, and lumbar spine. Negative visible bowel gas. IMPRESSION: Improved ventilation from yesterday. No acute cardiopulmonary abnormality now. Electronically Signed   By: Genevie Ann M.D.   On: 04/30/2021 08:35   DG Chest Port 1 View  Result Date: 04/29/2021 CLINICAL DATA:  Shortness of breath and weight gain EXAM: PORTABLE CHEST 1 VIEW COMPARISON:  02/05/2021 FINDINGS: Cardiac shadow is mildly prominent but stable. Postsurgical changes are again seen. Lungs are well aerated bilaterally. Increased vascular congestion is noted without significant edema. No focal infiltrate is noted. Degenerative changes of the thoracic spine are noted. IMPRESSION: Vascular congestion consistent with mild CHF. Electronically Signed   By: Inez Catalina M.D.   On: 04/29/2021 19:59

## 2021-05-03 MED ORDER — FUROSEMIDE 80 MG PO TABS: 80.0000 mg | ORAL_TABLET | Freq: Two times a day (BID) | ORAL | Status: DC

## 2021-05-03 MED ORDER — FUROSEMIDE 10 MG/ML IJ SOLN
60.0000 mg | Freq: Once | INTRAMUSCULAR | Status: AC
  Administered 2021-05-03: 60 mg via INTRAVENOUS
  Filled 2021-05-03: qty 6

## 2021-05-03 MED ORDER — METHYLPREDNISOLONE SODIUM SUCC 40 MG IJ SOLR
40.0000 mg | Freq: Two times a day (BID) | INTRAMUSCULAR | Status: DC
  Administered 2021-05-03 – 2021-05-04 (×2): 40 mg via INTRAVENOUS
  Filled 2021-05-03 (×2): qty 1

## 2021-05-03 MED ORDER — SPIRONOLACTONE 25 MG PO TABS
25.0000 mg | ORAL_TABLET | Freq: Every day | ORAL | Status: DC
  Administered 2021-05-04: 25 mg via ORAL
  Filled 2021-05-03: qty 1

## 2021-05-03 NOTE — Progress Notes (Addendum)
Physical Therapy Treatment Patient Details Name: [name] MRN: [id number] DOB: [birth year] Today's Date: 05/03/2021    History of Present Illness Pt is an 80 y/o female admitted secondary to increased SOB. Thought to be secondary to COPD and CHF exacerbation. PMH includes HTN, PAD, CHF, and CAD.    PT Comments    Pt is progressing steadily toward goals, still limited by dyspnea and productive cough.  Emphasis on standing exercises with recovery periods and progression of gait stability and stamina.    Follow Up Recommendations  Home health PT;Supervision for mobility/OOB     Equipment Recommendations  None recommended by PT    Recommendations for Other Services       Precautions / Restrictions Precautions Precautions: Fall Precaution Comments: Monitor SpO2    Mobility  Bed Mobility Overal bed mobility: Needs Assistance Bed Mobility: Supine to Sit     Supine to sit: Supervision;HOB elevated     General bed mobility comments: moves well, but needs extra time due to cough spells    Transfers Overall transfer level: Needs assistance Equipment used: Rolling walker (2 wheeled) Transfers: Sit to/from Stand Sit to Stand: Min guard         General transfer comment: cues for hand placement on descent,  stands with appropriate use of hands.  Ambulation/Gait Ambulation/Gait assistance: Min guard Gait Distance (Feet): 100 Feet (x2 with standing rest propping on elbows at nurses station for sat reading.) Assistive device: Rolling walker (2 wheeled) Gait Pattern/deviations: Step-through pattern;Decreased stride length   Gait velocity interpretation: <1.31 ft/sec, indicative of household ambulator General Gait Details: On 3L during first trial of gait, sats dropped to 86% and quickly rose to 90% with HR 91 bpm during standing rest.   On 4L [place] sats appeared to maintain between 91 and 95% with HR at 115 bpm   Stairs             Wheelchair Mobility     Modified Rankin (Stroke Patients Only)       Balance     Sitting balance-Leahy Scale: Fair (or better)     Standing balance support: Bilateral upper extremity supported;During functional activity Standing balance-Leahy Scale: Poor Standing balance comment: Reliant on at least single UE support during functional tasks in standing.                            Cognition Arousal/Alertness: Awake/alert Behavior During Therapy: WFL for tasks assessed/performed Overall Cognitive Status: Within Functional Limits for tasks assessed                                        Exercises General Exercises - Lower Extremity Hip ABduction/ADduction: AROM;Strengthening;Both;10 reps;Standing Hip Flexion/Marching: AROM;Strengthening;Both;10 reps;Standing    General Comments General comments (skin integrity, edema, etc.): On arrival, pt on RA and O2 in her lap.  Sats 90% and 92 bpm, at rest.  Pt reports her granddaughter can stay with her at d/c and can help her with anything she needs.      Pertinent Vitals/Pain Pain Assessment: No/denies pain    Home Living                      Prior Function            PT Goals (current goals can now be found in the care plan section)  Acute Rehab PT Goals Patient Stated Goal: to feel better and go home PT Goal Formulation: With patient Time For Goal Achievement: 05/14/21 Potential to Achieve Goals: Good Progress towards PT goals: Progressing toward goals    Frequency    Min 3X/week      PT Plan Current plan remains appropriate    Co-evaluation              AM-PAC PT "6 Clicks" Mobility   Outcome Measure  Help needed turning from your back to your side while in a flat bed without using bedrails?: A Little Help needed moving from lying on your back to sitting on the side of a flat bed without using bedrails?: A Little Help needed moving to and from a bed to a chair (including a wheelchair)?: A  Little Help needed standing up from a chair using your arms (e.g., wheelchair or bedside chair)?: A Little Help needed to walk in hospital room?: A Little Help needed climbing 3-5 steps with a railing? : A Lot 6 Click Score: 17    End of Session Equipment Utilized During Treatment: Oxygen Activity Tolerance: Patient limited by fatigue Patient left: in chair;with call bell/phone within reach;with chair alarm set;with family/visitor present Nurse Communication: Mobility status PT Visit Diagnosis: Unsteadiness on feet (R26.81);Muscle weakness (generalized) (M62.81);Difficulty in walking, not elsewhere classified (R26.2)     Time: 6761-9509 PT Time Calculation (min) (ACUTE ONLY): 25 min  Charges:  $Gait Training: 8-22 mins $Therapeutic Exercise: 8-22 mins                     05/03/2021  Ginger Carne., PT Acute Rehabilitation Services [phone]  (pager) [phone]  (office)   Tessie Fass Ezechiel [place] 05/03/2021, 4:16 PM

## 2021-05-03 NOTE — Progress Notes (Signed)
PROGRESS NOTE                                                                                                                                                                                                             Patient Demographics:    [name], is a 80 y.o. female, DOB - February 15, 1941, PI:[id number]  Outpatient Primary MD for the patient is [name], [name], MD    LOS - 3  Admit date - 04/29/2021    Chief Complaint  Patient presents with  . Code Sepsis       Brief Narrative (HPI from H&P) - [name] is a 80 y.o. female with medical history significant of CHF, CAD, GERD, angiodysplasia of intestine, chronic low back pain, gout, COPD, hyperlipidemia, hypertension, history of PE, hypothyroidism, PAD, left subclavian artery stenosis, anemia, OSA who presents with shortness of breath. She was diagnosed with acute on chronic hypoxic respiratory failure due to COPD exacerbation and CHF exacerbation.   Subjective:   Patient in bed, appears comfortable, denies any headache, no fever, no chest pain or pressure, improved shortness of breath , no abdominal pain. No new focal weakness.   Assessment  & Plan :     1. Acute on Chronic Hypoxic Resp. Failure due to acute on chronic COPD exacerbation and acute on chronic diastolic CHF exacerbation.  65% on recent echo - she has been placed on IV steroids and IV Lasix, Nebs, o2 as needed - she uses 4 Lit o2 at baseline, encouraged the patient to sit up in chair in the daytime use I-S and flutter valve for pulmonary toiletry.  Will advance activity and titrate down oxygen as possible.  Actually improving with repeat IV Lasix again on 05/03/2021, if continues to be better likely discharge home on 05/04/2021.   SpO2: 97 % O2 Flow Rate (L/min): 3 L/min FiO2 (%): 32 %   Recent Labs  Lab 04/29/21 1835 04/29/21 1900 04/29/21 2055 04/30/21 0428 04/30/21 0647 05/01/21 0216  05/02/21 0147  WBC  --  7.1  --  5.4  --  16.5* 15.0*  HGB  --  11.4*  --  11.2*  --  10.7* 10.8*  HCT  --  38.7  --  37.2  --  35.6* 35.1*  PLT  --  207  --  191  --  204 213  BNP  --   --   --  683.1*  --  963.8* 679.6*  PROCALCITON  --   --   --  <0.10  --  <0.10 <0.10  AST  --  19  --  36  --  72* 43*  ALT  --  12  --  14  --  21 22  ALKPHOS  --  64  --  62  --  54 52  BILITOT  --  0.5  --  0.1*  --  0.3 0.2*  ALBUMIN  --  3.5  --  3.2*  --  3.1* 3.1*  INR  --  1.1  --   --   --   --   --   LATICACIDVEN  --   --  1.8 5.7* 3.9*  --   --   SARSCOV2NAA NEGATIVE  --   --   --   --   --   --     2. CAD, PAD - History of CABG in 2004 and DES in 2010 and 2012, on ASA, statin, no acute issues.  3. Hyperlipidemia - on  Atorvastatin.  4. Chronic pain - Continue home Xtampza ER.  5. Anxiety & Depression - Continue home Abilify, trazodone.  6. Restless leg - Continue home pramipexole.  7. GERD - Has a history of GI bleed, continue home PPI.  8. Gout - Continue home allopurinol.  9. Hypothyroidism - Continue home Synthroid.  10. OSA - Continue home oxygen.  11.  Mild acute on chronic diastolic heart failure EF 65%.  Repeat Lasix IV on 05/03/2021 and monitor.      Condition - Guarded  Family Communication  : Bronwen Betters [phone] On 05/01/2021, 05/02/21  Code Status :  DNR  Consults  :  PCCM  PUD Prophylaxis : PPI   Procedures  :      TTE 09/2020 -   1. Diastology suggestive of impaired LV filling. Left ventricular ejection fraction, by estimation, is 60 to 65%. The left ventricle has normal function. The left ventricle has no regional wall motion abnormalities. There is moderate concentric left ventricular hypertrophy. Left ventricular diastolic parameters are indeterminate.  2. Right ventricular systolic function is low normal. The right ventricular size is normal. There is normal pulmonary artery systolic pressure.  3. Left atrial size was moderately  dilated.  4. Right atrial size was moderately dilated.  5. The mitral valve is myxomatous. No evidence of mitral valve regurgitation. The mean mitral valve gradient is 5.0 mmHg with average heart rate of 87 bpm. Moderate mitral annular calcification.  6. The aortic valve is tricuspid. Aortic valve regurgitation is not visualized.  7. The inferior vena cava is dilated in size with <50% respiratory variability, suggesting right atrial pressure of 15 mmHg.      Disposition Plan  :     Dispo: The patient is from: Home              Anticipated d/c is to: Home              Patient currently is not medically stable to d/c.   Difficult to place patient No  DVT Prophylaxis  :     Lab Results  Component Value Date   PLT 213 05/02/2021    Diet :  Diet Order            Diet heart healthy/carb modified Room service appropriate? Yes; Fluid consistency: Thin; Fluid restriction:  1500 mL Fluid  Diet effective now                  Inpatient Medications  Scheduled Meds: . allopurinol  100 mg Oral Q breakfast  . ARIPiprazole  1 mg Oral Q breakfast  . aspirin EC  81 mg Oral Q breakfast  . atorvastatin  20 mg Oral Q supper  . doxycycline  100 mg Oral Q12H  . ferrous sulfate  325 mg Oral Q breakfast  . [START ON 05/04/2021] furosemide  80 mg Oral BID  . gabapentin  100 mg Oral Q breakfast  . ipratropium-albuterol  3 mL Nebulization Q6H WA  . levothyroxine  50 mcg Oral Q0600  . melatonin  10 mg Oral QHS  . methylPREDNISolone (SOLU-MEDROL) injection  40 mg Intravenous Q12H  . oxyCODONE  10 mg Oral BID  . pantoprazole  40 mg Oral Daily  . pilocarpine  1 drop Both Eyes BID  . pramipexole  0.25 mg Oral Q supper  . roflumilast  500 mcg Oral Q breakfast  . sodium chloride flush  3 mL Intravenous Q12H  . [START ON 05/04/2021] spironolactone  25 mg Oral Daily  . traZODone  150 mg Oral QHS   Continuous Infusions: PRN Meds:.acetaminophen **OR** [DISCONTINUED] acetaminophen, albuterol,  nitroGLYCERIN, polyethylene glycol  Antibiotics  :    Anti-infectives (From admission, onward)   Start     Dose/Rate Route Frequency Ordered Stop   04/30/21 2100  azithromycin (ZITHROMAX) tablet 250 mg  Status:  Discontinued        250 mg Oral Daily 04/29/21 2158 04/29/21 2311   04/29/21 2315  doxycycline (VIBRA-TABS) tablet 100 mg  Status:  Discontinued        100 mg Oral  Once 04/29/21 2311 04/29/21 2311   04/29/21 2315  doxycycline (VIBRA-TABS) tablet 100 mg        100 mg Oral Every 12 hours 04/29/21 2311 05/04/21 2159   04/29/21 2000  cefTRIAXone (ROCEPHIN) 2 g in sodium chloride 0.9 % 100 mL IVPB  Status:  Discontinued        2 g 200 mL/hr over 30 Minutes Intravenous Every 24 hours 04/29/21 1906 04/29/21 2311   04/29/21 2000  azithromycin (ZITHROMAX) 500 mg in sodium chloride 0.9 % 250 mL IVPB  Status:  Discontinued        500 mg 250 mL/hr over 60 Minutes Intravenous Every 24 hours 04/29/21 1906 04/29/21 2201   04/29/21 1915  levofloxacin (LEVAQUIN) IVPB 750 mg  Status:  Discontinued        750 mg 100 mL/hr over 90 Minutes Intravenous  Once 04/29/21 1900 04/29/21 1906       Time Spent in minutes  30   Lala Lund M.D on 05/03/2021 at 10:32 AM  To page go to [email or web address]   [facility] -  Office  [phone]    See all Orders from today for further details    Objective:   Vitals:   05/02/21 2102 05/03/21 0403 05/03/21 0500 05/03/21 0759  BP:      Pulse:    78  Resp:    16  Temp:      TempSrc:      SpO2: 98% 97%    Weight:   78 kg   Height:        Wt Readings from Last 3 Encounters:  05/03/21 78 kg  02/21/21 77.5 kg  02/12/21 75.8 kg     Intake/Output Summary (Last 24 hours) at 05/03/2021  [place] filed at 05/03/2021 0600 Gross per 24 hour  Intake 350 ml  Output 700 ml  Net -350 ml     Physical Exam  Awake Alert, No new F.N deficits, Normal affect [place].AT,PERRAL Supple Neck,No JVD, No cervical lymphadenopathy appriciated.   Symmetrical Chest wall movement, Good air movement bilaterally, some rales and exp wheezes RRR,No Gallops, Rubs or new Murmurs, No Parasternal Heave +ve B.Sounds, Abd Soft, No tenderness, No organomegaly appriciated, No rebound - guarding or rigidity. No Cyanosis, Clubbing, trace edema   Data Review:    CBC Recent Labs  Lab 04/29/21 1900 04/30/21 0428 05/01/21 0216 05/02/21 0147  WBC 7.1 5.4 16.5* 15.0*  HGB 11.4* 11.2* 10.7* 10.8*  HCT 38.7 37.2 35.6* 35.1*  PLT 207 191 204 213  MCV 92.1 91.0 89.4 88.0  MCH 27.1 27.4 26.9 27.1  MCHC 29.5* 30.1 30.1 30.8  RDW 16.1* 16.2* 16.1* 16.2*  LYMPHSABS 0.9  --  0.8 1.0  MONOABS 0.7  --  0.4 0.6  EOSABS 0.0  --  0.0 0.0  BASOSABS 0.1  --  0.0 0.0    Recent Labs  Lab 04/29/21 1900 04/29/21 2055 04/30/21 0428 04/30/21 0647 05/01/21 0216 05/02/21 0147  NA 138  --  140  --  138 136  K 4.5  --  3.4*  --  4.5 4.2  CL 100  --  102  --  102 99  CO2 32  --  26  --  26 30  GLUCOSE 124*  --  188*  --  150* 131*  BUN 14  --  18  --  30* 40*  CREATININE 1.10*  --  1.26*  --  1.12* 1.19*  CALCIUM 8.3*  --  8.1*  --  8.1* 9.0  AST 19  --  36  --  72* 43*  ALT 12  --  14  --  21 22  ALKPHOS 64  --  62  --  54 52  BILITOT 0.5  --  0.1*  --  0.3 0.2*  ALBUMIN 3.5  --  3.2*  --  3.1* 3.1*  MG  --   --  2.4  --  2.3 2.4  PROCALCITON  --   --  <0.10  --  <0.10 <0.10  LATICACIDVEN  --  1.8 5.7* 3.9*  --   --   INR 1.1  --   --   --   --   --   BNP  --   --  683.1*  --  963.8* 679.6*    ------------------------------------------------------------------------------------------------------------------ No results for input(s): CHOL, HDL, LDLCALC, TRIG, CHOLHDL, LDLDIRECT in the last 72 hours.  No results found for: HGBA1C ------------------------------------------------------------------------------------------------------------------ No results for input(s): TSH, T4TOTAL, T3FREE, THYROIDAB in the last 72 hours.  Invalid input(s):  FREET3  Cardiac Enzymes No results for input(s): CKMB, TROPONINI, MYOGLOBIN in the last 168 hours.  Invalid input(s): CK ------------------------------------------------------------------------------------------------------------------    Component Value Date/Time   BNP 679.6 (H) 05/02/2021 0147    Micro Results Recent Results (from the past 240 hour(s))  Resp Panel by RT-PCR (Flu A&B, Covid) Nasopharyngeal Swab     Status: None   Collection Time: 04/29/21  6:35 PM   Specimen: Nasopharyngeal Swab; Nasopharyngeal(NP) swabs in vial transport medium  Result Value Ref Range Status   SARS Coronavirus 2 by RT PCR NEGATIVE NEGATIVE Final    Comment: (NOTE) SARS-CoV-2 target nucleic acids are NOT DETECTED.  The SARS-CoV-2 RNA is generally detectable in  upper respiratory specimens during the acute phase of infection. The lowest concentration of SARS-CoV-2 viral copies this assay can detect is 138 copies/mL. A negative result does not preclude SARS-Cov-2 infection and should not be used as the sole basis for treatment or other patient management decisions. A negative result may occur with  improper specimen collection/handling, submission of specimen other than nasopharyngeal swab, presence of viral mutation(s) within the areas targeted by this assay, and inadequate number of viral copies(<138 copies/mL). A negative result must be combined with clinical observations, patient history, and epidemiological information. The expected result is Negative.  Fact Sheet for Patients:  EntrepreneurPulse.com.au  Fact Sheet for Healthcare Providers:  IncredibleEmployment.be  This test is no t yet approved or cleared by the [place] FDA and  has been authorized for detection and/or diagnosis of SARS-CoV-2 by FDA under an Emergency Use Authorization (EUA). This EUA will remain  in effect (meaning this test can be used) for the duration of the COVID-19  declaration under Section 564(b)(1) of the Act, 21 U.S.C.section 360bbb-3(b)(1), unless the authorization is terminated  or revoked sooner.       Influenza A by PCR NEGATIVE NEGATIVE Final   Influenza B by PCR NEGATIVE NEGATIVE Final    Comment: (NOTE) The Xpert Xpress SARS-CoV-2/FLU/RSV plus assay is intended as an aid in the diagnosis of influenza from Nasopharyngeal swab specimens and should not be used as a sole basis for treatment. Nasal washings and aspirates are unacceptable for Xpert Xpress SARS-CoV-2/FLU/RSV testing.  Fact Sheet for Patients: EntrepreneurPulse.com.au  Fact Sheet for Healthcare Providers: IncredibleEmployment.be  This test is not yet approved or cleared by the [place] FDA and has been authorized for detection and/or diagnosis of SARS-CoV-2 by FDA under an Emergency Use Authorization (EUA). This EUA will remain in effect (meaning this test can be used) for the duration of the COVID-19 declaration under Section 564(b)(1) of the Act, 21 U.S.C. section 360bbb-3(b)(1), unless the authorization is terminated or revoked.  Performed at [facility], [place] [address] [place], [place] [address]   Blood Culture (routine x 2)     Status: None (Preliminary result)   Collection Time: 04/29/21  6:50 PM   Specimen: BLOOD  Result Value Ref Range Status   Specimen Description BLOOD LEFT ANTECUBITAL  Final   Special Requests   Final    BOTTLES DRAWN AEROBIC AND ANAEROBIC Blood Culture adequate volume   Culture   Final    NO GROWTH 3 DAYS Performed at [facility], [place] [address] [place], [place] [address]    Report Status PENDING  Incomplete  Blood Culture (routine x 2)     Status: None (Preliminary result)   Collection Time: 04/29/21  6:55 PM   Specimen: BLOOD RIGHT HAND  Result Value Ref Range Status   Specimen Description BLOOD RIGHT HAND  Final   Special Requests   Final    BOTTLES DRAWN AEROBIC AND  ANAEROBIC Blood Culture results may not be optimal due to an inadequate volume of blood received in culture bottles   Culture   Final    NO GROWTH 3 DAYS Performed at [facility], [place] [address] [place], [place] [address]    Report Status PENDING  Incomplete    Radiology Reports DG Chest Port 1 View  Result Date: 04/30/2021 CLINICAL DATA:  80 year old female with shortness of breath.  COPD. EXAM: PORTABLE CHEST 1 VIEW COMPARISON:  Portable chest 04/29/2021 and earlier. FINDINGS: Portable AP upright  view at 0805 hours. Larger lung volumes. Mild increased pulmonary interstitial markings appear stable compared to February. No pneumothorax, pleural effusion or acute pulmonary opacity. Incidental calcified breast implants. Stable cardiac size and mediastinal contours. Prior CABG. Postoperative changes to the right shoulder, cervical spine, and lumbar spine. Negative visible bowel gas. IMPRESSION: Improved ventilation from yesterday. No acute cardiopulmonary abnormality now. Electronically Signed   By: Genevie Ann M.D.   On: 04/30/2021 08:35   DG Chest Port 1 View  Result Date: 04/29/2021 CLINICAL DATA:  Shortness of breath and weight gain EXAM: PORTABLE CHEST 1 VIEW COMPARISON:  02/05/2021 FINDINGS: Cardiac shadow is mildly prominent but stable. Postsurgical changes are again seen. Lungs are well aerated bilaterally. Increased vascular congestion is noted without significant edema. No focal infiltrate is noted. Degenerative changes of the thoracic spine are noted. IMPRESSION: Vascular congestion consistent with mild CHF. Electronically Signed   By: Inez Catalina M.D.   On: 04/29/2021 19:59

## 2021-05-04 LAB — CULTURE, BLOOD (ROUTINE X 2)
Culture: NO GROWTH
Culture: NO GROWTH
Special Requests: ADEQUATE

## 2021-05-04 MED ORDER — FUROSEMIDE 10 MG/ML IJ SOLN
40.0000 mg | Freq: Once | INTRAMUSCULAR | Status: AC
  Administered 2021-05-04: 40 mg via INTRAVENOUS
  Filled 2021-05-04: qty 4

## 2021-05-04 MED ORDER — DOXYCYCLINE HYCLATE 100 MG PO TABS: 100.0000 mg | ORAL_TABLET | Freq: Two times a day (BID) | ORAL | 0 refills | Status: AC

## 2021-05-04 MED ORDER — FUROSEMIDE 80 MG PO TABS: 80.0000 mg | ORAL_TABLET | Freq: Two times a day (BID) | ORAL | Status: DC

## 2021-05-04 MED ORDER — PREDNISONE 5 MG PO TABS: ORAL_TABLET | ORAL | 0 refills | Status: DC

## 2021-05-04 NOTE — TOC Transition Note (Signed)
Transition of Care [facility]) - CM/SW Discharge Note   Patient Details  Name: Ranay Ketter MRN: [id number] Date of Birth: 07/10/1941  Transition of Care [facility]) CM/SW Contact:  Carles Collet, RN Phone Number: 05/04/2021, 9:38 AM   Clinical Narrative:   Kaylyn Layer home health liaison of discharge today.  Patient will have PT OT RN at home    Final next level of care: [place] Barriers to Discharge: Continued Medical Work up   Patient Goals and CMS Choice Patient states their goals for this hospitalization and ongoing recovery are:: "go home" CMS Medicare.gov Compare Post Acute Care list provided to:: Patient Choice offered to / list presented to : Patient  Discharge Placement                       Discharge Plan and Services     Post Acute Care Choice: Home Health          DME Arranged: N/A         HH Arranged: PT,RN,Nurse's Aide HH Agency: [place] Date [facility] Agency Contacted: 05/03/21 Time [place]: 8280 Representative spoke with at [place]: [employer] ([place]) Interventions     Readmission Risk Interventions No flowsheet data found.

## 2021-05-04 NOTE — Discharge Instructions (Signed)
Follow with Primary MD [name], [name], MD in 7 days   Get CBC, CMP, 2 view Chest X ray -  checked next visit within 1 week by Primary MD    Activity: As tolerated with Full fall precautions use walker/cane & assistance as needed  Disposition Home    Diet:    Diet Order            Diet heart healthy/ Low carb  ; Fluid restriction: 1500 mL Fluid                Check your Weight same time everyday, if you gain over 2 pounds, or you develop in leg swelling, experience more shortness of breath or chest pain, call your Primary MD immediately. Follow Cardiac Low Salt Diet and 1.5 lit/day fluid restriction.  Special Instructions: If you have smoked or chewed Tobacco  in the last 2 yrs please stop smoking, stop any regular Alcohol  and or any Recreational drug use.  On your next visit with your primary care physician please Get Medicines reviewed and adjusted.  Please request your Prim.MD to go over all Hospital Tests and Procedure/Radiological results at the follow up, please get all Hospital records sent to your Prim MD by signing hospital release before you go home.  If you experience worsening of your admission symptoms, develop shortness of breath, life threatening emergency, suicidal or homicidal thoughts you must seek medical attention immediately by calling 911 or calling your MD immediately  if symptoms less severe.  You Must read complete instructions/literature along with all the possible adverse reactions/side effects for all the Medicines you take and that have been prescribed to you. Take any new Medicines after you have completely understood and accpet all the possible adverse reactions/side effects.

## 2021-05-04 NOTE — Discharge Summary (Signed)
Elizabeht Suto CHE:[id number] DOB: 12-26-1940 DOA: 04/29/2021  PCP: [name], MD  Admit date: 04/29/2021  Discharge date: 05/04/2021  Admitted From: Home   Disposition:  Home   Recommendations for Outpatient Follow-up:   Follow up with PCP in 1-2 weeks  PCP Please obtain BMP/CBC, 2 view CXR in 1week,  (see Discharge instructions)   PCP Please follow up on the following pending results:    Home Health: PT,RN   Equipment/Devices: None  Consultations: None  Discharge Condition: Stable    CODE STATUS: Full    Diet Recommendation: Heart Healthy Low Carb with 1.5 lit/day fluid restriction  Diet Order            Diet heart healthy/carb modified Room service appropriate? Yes; Fluid consistency: Thin; Fluid restriction: 1500 mL Fluid  Diet effective now                  Chief Complaint  Patient presents with  . Code Sepsis     Brief history of present illness from the day of admission and additional interim summary     Mykelle Cockerell a 80 y.o.femalewith medical history significant ofCHF, CAD, GERD, angiodysplasia of intestine, chronic low back pain, gout, COPD, hyperlipidemia, hypertension, history of PE, hypothyroidism, PAD, left subclavian artery stenosis, anemia, OSA who presents with shortness of breath. She was diagnosed with acute on chronic hypoxic respiratory failure due to COPD exacerbation and dCHF exacerbation.                                                                 Hospital Course    1. Acute on Chronic Hypoxic Resp. Failure due to acute on chronic COPD exacerbation and acute on chronic diastolic CHF exacerbation.  65% on recent echo - she has been placed on IV steroids and IV Lasix, Doxy, Nebs, o2 as needed - she uses 4 Lit o2 at baseline ( better now @ 3lits) ,  encouraged the patient to sit up in chair in the daytime use I-S and flutter valve for pulmonary toiletry even at home.    She is now at her baseline and wants to go home today, will DC on PO Steroid taper + Doxy, close PCP follow up.  SpO2: 94 % O2 Flow Rate (L/min): 3 L/min FiO2 (%): 32 %  2. CAD, PAD - History of CABG in 2004 and DES in 2010 and 2012, on ASA, statin, no acute issues.  3. Mild acute on chronic diastolic heart failure EF 65%.  Repeat Lasix IV on 5/14/2022prior to DC and then DC on Home dose diuretics with claose PCP and Cards follow up.  4. Chronic pain - Continue home Xtampza ER.  5. Anxiety & Depression - Continue home Abilify, trazodone.  6. Restless leg -Continue home pramipexole.  7. GERD - Has a history of GI bleed, continue home PPI.  8. Gout-Continue home allopurinol.  9. Hypothyroidism -Continue home Synthroid.  10. OSA - Continue home oxygen 4lits, currently on 3lit here.  11. Hyperlipidemia -on  Atorvastatin.   Discharge diagnosis     Principal Problem:   COPD exacerbation ([place]) Active Problems:   Hypothyroidism   Essential hypertension   Dyslipidemia   PAD (peripheral artery disease) (H[facility])   GERD without esophagitis   Chronic bilateral low back pain without sciatica   CHF (congestive heart failure) (HCC)   CAD (coronary artery disease) of bypass graft   Prolonged QT interval   Depression   RLS (restless legs syndrome)    Discharge instructions    Discharge Instructions    Discharge instructions   Complete by: As directed    Follow with Primary MD [name], MD in 7 days   Get CBC, CMP, 2 view Chest X ray -  checked next visit within 1 week by Primary MD    Activity: As tolerated with Full fall precautions use walker/cane & assistance as needed  Disposition Home    Diet:    Diet Order         Diet heart healthy/ Low carb  ; Fluid restriction: 1500 mL Fluid      Check your Weight same time everyday, if  you gain over 2 pounds, or you develop in leg swelling, experience more shortness of breath or chest pain, call your Primary MD immediately. Follow Cardiac Low Salt Diet and 1.5 lit/day fluid restriction.  Special Instructions: If you have smoked or chewed Tobacco  in the last 2 yrs please stop smoking, stop any regular Alcohol  and or any Recreational drug use.  On your next visit with your primary care physician please Get Medicines reviewed and adjusted.  Please request your Prim.MD to go over all Hospital Tests and Procedure/Radiological results at the follow up, please get all Hospital records sent to your Prim MD by signing hospital release before you go home.  If you experience worsening of your admission symptoms, develop shortness of breath, life threatening emergency, suicidal or homicidal thoughts you must seek medical attention immediately by calling 911 or calling your MD immediately  if symptoms less severe.  You Must read complete instructions/literature along with all the possible adverse reactions/side effects for all the Medicines you take and that have been prescribed to you. Take any new Medicines after you have completely understood and accpet all the possible adverse reactions/side effects.   Increase activity slowly   Complete by: As directed       Discharge Medications   Allergies as of 05/04/2021      Reactions   Ativan [lorazepam] Other (See Comments)   Confusion    Pitavastatin Itching   Reaction to Livalo   Ropinirole Nausea Only, Other (See Comments)   Makes legs jump, also   Zofran [ondansetron Hcl] Nausea Only   Zolpidem Tartrate Anxiety, Other (See Comments)   CONFUSION    Penicillins Rash, Other (See Comments)   Tolerated cephalosporins in past Has patient had a PCN reaction causing immediate rash, facial/tongue/throat swelling, SOB or lightheadedness with hypotension: Yes Has patient had a PCN reaction causing severe rash involving mucus membranes or  skin necrosis: No Has patient had a PCN reaction that required hospitalization: No Has patient had a PCN reaction occurring within the last 10 years: No If all of the above answers are "NO", then may proceed  with Cephalosporin use.      Medication List    STOP taking these medications   BC HEADACHE POWDER PO     TAKE these medications   allopurinol 100 MG tablet Commonly known as: ZYLOPRIM Take 100 mg by mouth daily with breakfast.   ARIPiprazole 2 MG tablet Commonly known as: ABILIFY Take 2 mg by mouth daily with breakfast.   aspirin EC 81 MG tablet Take 81 mg by mouth daily with breakfast. Swallow whole.   atorvastatin 20 MG tablet Commonly known as: LIPITOR Take 20 mg by mouth daily with supper.   cholecalciferol 25 MCG (1000 UNIT) tablet Commonly known as: VITAMIN D3 Take 1,000 Units by mouth daily with supper.   citalopram 40 MG tablet Commonly known as: CELEXA Take 40 mg by mouth daily with supper.   Combivent Respimat 20-100 MCG/ACT Aers respimat Generic drug: Ipratropium-Albuterol Inhale 1 puff into the lungs 3 (three) times daily as needed for wheezing or shortness of breath.   cyclobenzaprine 10 MG tablet Commonly known as: FLEXERIL Take 10 mg by mouth.   doxycycline 100 MG tablet Commonly known as: VIBRA-TABS Take 1 tablet (100 mg total) by mouth every 12 (twelve) hours for 6 doses.   ferrous sulfate 325 (65 FE) MG tablet Take 325 mg by mouth daily with breakfast.   furosemide 40 MG tablet Commonly known as: LASIX Take 1 tablet (40 mg total) by mouth daily. What changed: when to take this   gabapentin 100 MG capsule Commonly known as: NEURONTIN Take 100 mg by mouth daily with breakfast.   levothyroxine 50 MCG tablet Commonly known as: SYNTHROID Take 1 tablet (50 mcg total) by mouth daily at 6 (six) AM.   magnesium oxide 400 (241.3 Mg) MG tablet Commonly known as: MAG-OX Take 1 tablet (400 mg total) by mouth 2 (two) times daily.    Melatonin 10 MG Tabs Take 30 mg by mouth at bedtime.   nitroGLYCERIN 0.4 MG SL tablet Commonly known as: NITROSTAT Place 1 tablet (0.4 mg total) under the tongue every 5 (five) minutes as needed for chest pain. For chest pain   omeprazole 40 MG capsule Commonly known as: PRILOSEC TAKE 1 CAPSULE BY MOUTH 2 TIMES DAILY.   OXYGEN Inhale 4 L/min into the lungs as needed (DURING ALL TIMES OF EXERTION).   pilocarpine 1 % ophthalmic solution Commonly known as: PILOCAR Place 1 drop into both eyes 2 (two) times daily.   potassium chloride SA 20 MEQ tablet Commonly known as: KLOR-CON Take 20 mEq by mouth daily with lunch.   pramipexole 0.25 MG tablet Commonly known as: MIRAPEX Take 0.25 mg by mouth daily with supper.   predniSONE 5 MG tablet Commonly known as: DELTASONE Label  & dispense according to the schedule below. take 8 Pills PO for 3 days, 6 Pills PO for 3 days, 4 Pills PO for 3 days, 2 Pills PO for 3 days, 1 Pills PO for 3 days, 1/2 Pill  PO for 3 days then STOP. Total 65 pills.   roflumilast 500 MCG Tabs tablet Commonly known as: DALIRESP Take 500 mcg by mouth daily with breakfast.   spironolactone 25 MG tablet Commonly known as: ALDACTONE Take 0.5 tablets (12.5 mg total) by mouth daily.   traZODone 50 MG tablet Commonly known as: DESYREL Take 150 mg by mouth at bedtime.   vitamin C 500 MG tablet Commonly known as: ASCORBIC ACID Take 500 mg by mouth daily with lunch.   Xtampza ER 9 MG C12a  Generic drug: oxyCODONE ER Take 9 mg by mouth 2 (two) times daily.        Follow-up Information    [name], [name], MD. Schedule an appointment as soon as possible for a visit in 1 week(s).   Specialty: Internal Medicine Contact information: [address] [place] [address] [phone]        Fay Records, MD. Schedule an appointment as soon as possible for a visit in 1 week(s).   Specialty: Cardiology Contact information: [place] [address] [phone]        Brand Males, MD. Schedule an appointment as soon as possible for a visit in 1 week(s).   Specialty: Pulmonary Disease Why: COPD Contact information: [place] [address] [phone]               Major procedures and Radiology Reports - PLEASE review detailed and final reports thoroughly  -       DG Chest Port 1 View  Result Date: 04/30/2021 CLINICAL DATA:  80 year old female with shortness of breath.  COPD. EXAM: PORTABLE CHEST 1 VIEW COMPARISON:  Portable chest 04/29/2021 and earlier. FINDINGS: Portable AP upright view at 0805 hours. Larger lung volumes. Mild increased pulmonary interstitial markings appear stable compared to February. No pneumothorax, pleural effusion or acute pulmonary opacity. Incidental calcified breast implants. Stable cardiac size and mediastinal contours. Prior CABG. Postoperative changes to the right shoulder, cervical spine, and lumbar spine. Negative visible bowel gas. IMPRESSION: Improved ventilation from yesterday. No acute cardiopulmonary abnormality now. Electronically Signed   By: Genevie Ann M.D.   On: 04/30/2021 08:35   DG Chest Port 1 View  Result Date: 04/29/2021 CLINICAL DATA:  Shortness of breath and weight gain EXAM: PORTABLE CHEST 1 VIEW COMPARISON:  02/05/2021 FINDINGS: Cardiac shadow is mildly prominent but stable. Postsurgical changes are again seen. Lungs are well aerated bilaterally. Increased vascular congestion is noted without significant edema. No focal infiltrate is noted. Degenerative changes of the thoracic spine are noted. IMPRESSION: Vascular congestion consistent with mild CHF. Electronically Signed   By: Inez Catalina M.D.   On: 04/29/2021 19:59    Micro Results     Recent Results (from the past 240 hour(s))  Resp Panel by RT-PCR (Flu A&B, Covid) Nasopharyngeal Swab     Status: None   Collection Time: 04/29/21  6:35 PM   Specimen: Nasopharyngeal  Swab; Nasopharyngeal(NP) swabs in vial transport medium  Result Value Ref Range Status   SARS Coronavirus 2 by RT PCR NEGATIVE NEGATIVE Final    Comment: (NOTE) SARS-CoV-2 target nucleic acids are NOT DETECTED.  The SARS-CoV-2 RNA is generally detectable in upper respiratory specimens during the acute phase of infection. The lowest concentration of SARS-CoV-2 viral copies this assay can detect is 138 copies/mL. A negative result does not preclude SARS-Cov-2 infection and should not be used as the sole basis for treatment or other patient management decisions. A negative result may occur with  improper specimen collection/handling, submission of specimen other than nasopharyngeal swab, presence of viral mutation(s) within the areas targeted by this assay, and inadequate number of viral copies(<138 copies/mL). A negative result must be combined with clinical observations, patient history, and epidemiological information. The expected result is Negative.  Fact Sheet for Patients:  EntrepreneurPulse.com.au  Fact Sheet for Healthcare Providers:  IncredibleEmployment.be  This test is no t yet approved or cleared by the [place] FDA and  has  been authorized for detection and/or diagnosis of SARS-CoV-2 by FDA under an Emergency Use Authorization (EUA). This EUA will remain  in effect (meaning this test can be used) for the duration of the COVID-19 declaration under Section 564(b)(1) of the Act, 21 U.S.C.section 360bbb-3(b)(1), unless the authorization is terminated  or revoked sooner.       Influenza A by PCR NEGATIVE NEGATIVE Final   Influenza B by PCR NEGATIVE NEGATIVE Final    Comment: (NOTE) The Xpert Xpress SARS-CoV-2/FLU/RSV plus assay is intended as an aid in the diagnosis of influenza from Nasopharyngeal swab specimens and should not be used as a sole basis for treatment. Nasal washings and aspirates are unacceptable for Xpert Xpress  SARS-CoV-2/FLU/RSV testing.  Fact Sheet for Patients: EntrepreneurPulse.com.au  Fact Sheet for Healthcare Providers: IncredibleEmployment.be  This test is not yet approved or cleared by the [place] FDA and has been authorized for detection and/or diagnosis of SARS-CoV-2 by FDA under an Emergency Use Authorization (EUA). This EUA will remain in effect (meaning this test can be used) for the duration of the COVID-19 declaration under Section 564(b)(1) of the Act, 21 U.S.C. section 360bbb-3(b)(1), unless the authorization is terminated or revoked.  Performed at [facility], [place] [address] [place], [place] [address]   Blood Culture (routine x 2)     Status: None (Preliminary result)   Collection Time: 04/29/21  6:50 PM   Specimen: BLOOD  Result Value Ref Range Status   Specimen Description BLOOD LEFT ANTECUBITAL  Final   Special Requests   Final    BOTTLES DRAWN AEROBIC AND ANAEROBIC Blood Culture adequate volume   Culture   Final    NO GROWTH 4 DAYS Performed at [facility], [place] [address], [place], [place] [address]    Report Status PENDING  Incomplete  Blood Culture (routine x 2)     Status: None (Preliminary result)   Collection Time: 04/29/21  6:55 PM   Specimen: BLOOD RIGHT HAND  Result Value Ref Range Status   Specimen Description BLOOD RIGHT HAND  Final   Special Requests   Final    BOTTLES DRAWN AEROBIC AND ANAEROBIC Blood Culture results may not be optimal due to an inadequate volume of blood received in culture bottles   Culture   Final    NO GROWTH 4 DAYS Performed at [facility], [place] [address], [place], [place] [address]    Report Status PENDING  Incomplete    Today   Subjective    Annalisia Ingber today has no headache,no chest abdominal pain,no new weakness tingling or numbness, feels much better wants to go home today.    Objective   Blood pressure 124/72, pulse 82, temperature 98.3 F (36.8  C), temperature source Oral, resp. rate 16, height 5\' 8"  (1.727 m), weight 78 kg, SpO2 94 %.   Intake/Output Summary (Last 24 hours) at 05/04/2021 0850 Last data filed at 05/03/2021 1203 Gross per 24 hour  Intake --  Output 1000 ml  Net -1000 ml    Exam  Awake Alert, No new F.N deficits, Normal affect .AT,PERRAL Supple Neck,No JVD, No cervical lymphadenopathy appriciated.  Symmetrical Chest wall movement, Mod air movement bilaterally, mild wheezing RRR,No Gallops,Rubs or new Murmurs, No Parasternal Heave +ve B.Sounds, Abd Soft, Non tender, No organomegaly appriciated, No rebound -guarding or rigidity. No Cyanosis, Clubbing or edema, No new Rash or bruise   Data Review   CBC w Diff:  Lab Results  Component Value  Date   WBC 15.0 (H) 05/02/2021   HGB 10.8 (L) 05/02/2021   HGB 12.6 09/10/2020   HCT 35.1 (L) 05/02/2021   HCT 38.8 09/10/2020   PLT 213 05/02/2021   PLT 245 09/10/2020   LYMPHOPCT 6 05/02/2021   BANDSPCT 0 04/05/2018   MONOPCT 4 05/02/2021   EOSPCT 0 05/02/2021   BASOPCT 0 05/02/2021    CMP:  Lab Results  Component Value Date   NA 136 05/02/2021   NA 144 03/19/2021   K 4.2 05/02/2021   CL 99 05/02/2021   CO2 30 05/02/2021   BUN 40 (H) 05/02/2021   BUN 19 03/19/2021   CREATININE 1.19 (H) 05/02/2021   PROT 5.9 (L) 05/02/2021   PROT 6.3 10/05/2018   ALBUMIN 3.1 (L) 05/02/2021   ALBUMIN 3.8 10/05/2018   BILITOT 0.2 (L) 05/02/2021   BILITOT 0.2 10/05/2018   ALKPHOS 52 05/02/2021   AST 43 (H) 05/02/2021   ALT 22 05/02/2021  .   Total Time in preparing paper work, data evaluation and todays exam - 59 minutes  Lala Lund M.D on 05/04/2021 at 8:50 AM  [facility]

## 2021-05-06 ENCOUNTER — Other Ambulatory Visit: Payer: Self-pay

## 2021-05-06 NOTE — Patient Instructions (Signed)
Goals Addressed            This Visit's Progress   . THN-Make and Keep All Appointments   On track    Barriers: None Timeframe:  Long-Range Goal Priority:  High Start Date:   12/20/20                          Expected End Date:08/21/21              Follow Up Date 06/20/21  - ask family or friend for a ride    Why is this important?   Part of staying healthy is seeing the doctor for follow-up care.  If you forget your appointments, there are some things you can do to stay on track.    Notes: Family takes to appointments 03/22/21 patient continues to follow up with physicians as scheduled.  05/06/21 Family continues to assist with appointments.      . THN-Track and Manage Fluids and Swelling   On track    Barriers: Knowledge Timeframe:  Long-Range Goal Priority:  High Start Date:   12/20/20                          Expected End Date: 08/21/21           Follow Up Date 06/20/21   - track weight in diary - use salt in moderation - watch for swelling in feet, ankles and legs every day - weigh myself daily    Why is this important?   It is important to check your weight daily and watch how much salt and liquids you have.  It will help you to manage your heart failure.    Notes: Patient weighs daily. 12/20/20 173 lbs 01/10/21- 171 lbs. 02/20/21 patient weight 165 lbs  03/22/21 patient weight today 170 lbs but patient reports weight up to 176 lbs earlier this week and MD increased her lasix. Patient states she has to call to report her weight today.  Reiterated the importance of a low salt diet.   05/06/21 Patient weight 168 lbs.  Reiterated heart failure management.

## 2021-05-06 NOTE — Patient Outreach (Signed)
[name]) Care Management  [name]  05/06/2021   [name] 1941-04-09 [id number]  Subjective: Telephone call to patient for follow up after recent hospitalization for heart failure. Patient reports she is doing some better but her breathing is not good today. Patient had company earlier this morning and her voice is obviously weaker and noted to be some short of breath.  Encouraged patient to rest. She verbalized understanding. Patient weight 168 lbs.  Discussed heart failure management and importance of follow up appointments. She verbalized understanding. CM cut call short as patient was tired and having some shortness of breath. However, advised if shortness of breath gets worse to seek medical assistance. She verbalized understanding.    Objective:   Encounter Medications:  Outpatient Encounter Medications as of 05/06/2021  Medication Sig  . allopurinol (ZYLOPRIM) 100 MG tablet Take 100 mg by mouth daily with breakfast.  . ARIPiprazole (ABILIFY) 2 MG tablet Take 2 mg by mouth daily with breakfast.  . aspirin EC 81 MG tablet Take 81 mg by mouth daily with breakfast. Swallow whole.  Marland Kitchen atorvastatin (LIPITOR) 20 MG tablet Take 20 mg by mouth daily with supper.  . cholecalciferol (VITAMIN D3) 25 MCG (1000 UNIT) tablet Take 1,000 Units by mouth daily with supper.  . citalopram (CELEXA) 40 MG tablet Take 40 mg by mouth daily with supper.  . cyclobenzaprine (FLEXERIL) 10 MG tablet Take 10 mg by mouth.  . doxycycline (VIBRA-TABS) 100 MG tablet Take 1 tablet (100 mg total) by mouth every 12 (twelve) hours for 6 doses.  . ferrous sulfate 325 (65 FE) MG tablet Take 325 mg by mouth daily with breakfast.  . furosemide (LASIX) 40 MG tablet Take 1 tablet (40 mg total) by mouth daily. (Patient taking differently: Take 40 mg by mouth 2 (two) times daily.)  . gabapentin (NEURONTIN) 100 MG capsule Take 100 mg by mouth daily with breakfast.  . Ipratropium-Albuterol  (COMBIVENT RESPIMAT) 20-100 MCG/ACT AERS respimat Inhale 1 puff into the lungs 3 (three) times daily as needed for wheezing or shortness of breath.  . levothyroxine (SYNTHROID) 50 MCG tablet Take 1 tablet (50 mcg total) by mouth daily at 6 (six) AM.  . magnesium oxide (MAG-OX) 400 (241.3 Mg) MG tablet Take 1 tablet (400 mg total) by mouth 2 (two) times daily.  . Melatonin 10 MG TABS Take 30 mg by mouth at bedtime.  . nitroGLYCERIN (NITROSTAT) 0.4 MG SL tablet Place 1 tablet (0.4 mg total) under the tongue every 5 (five) minutes as needed for chest pain. For chest pain  . omeprazole (PRILOSEC) 40 MG capsule TAKE 1 CAPSULE BY MOUTH 2 TIMES DAILY.  Marland Kitchen OXYGEN Inhale 4 L/min into the lungs as needed (DURING ALL TIMES OF EXERTION).   Marland Kitchen pilocarpine (PILOCAR) 1 % ophthalmic solution Place 1 drop into both eyes 2 (two) times daily.  . potassium chloride SA (KLOR-CON) 20 MEQ tablet Take 20 mEq by mouth daily with lunch.  . pramipexole (MIRAPEX) 0.25 MG tablet Take 0.25 mg by mouth daily with supper.  . predniSONE (DELTASONE) 5 MG tablet Label  & dispense according to the schedule below. take 8 Pills PO for 3 days, 6 Pills PO for 3 days, 4 Pills PO for 3 days, 2 Pills PO for 3 days, 1 Pills PO for 3 days, 1/2 Pill  PO for 3 days then STOP. Total 65 pills.  . roflumilast (DALIRESP) 500 MCG TABS tablet Take 500 mcg by mouth daily with breakfast.  .  spironolactone (ALDACTONE) 25 MG tablet Take 0.5 tablets (12.5 mg total) by mouth daily.  . traZODone (DESYREL) 50 MG tablet Take 150 mg by mouth at bedtime.   . vitamin C (ASCORBIC ACID) 500 MG tablet Take 500 mg by mouth daily with lunch.  Ginger Organ ER 9 MG C12A Take 9 mg by mouth 2 (two) times daily.   No facility-administered encounter medications on file as of 05/06/2021.    Functional Status:  In your present state of health, do you have any difficulty performing the following activities: 04/29/2021 02/20/2021  Hearing? N N  Vision? N N  Difficulty  concentrating or making decisions? Y Y  Comment - forgets things at times  Walking or climbing stairs? Y Y  Comment - weakness at times- family assists  Dressing or bathing? N Y  Comment - granddaughter helps  Doing errands, shopping? Y Y  Comment - family helps  Preparing Food and eating ? - Y  Comment - family helps  Using the Toilet? - N  In the past six months, have you accidently leaked urine? - N  Do you have problems with loss of bowel control? - N  Managing your Medications? - Y  Comment - granddaughter helps  Managing your Finances? - N  Comment - family Helps  Housekeeping or managing your Housekeeping? - (No Data)  Comment - family helps  Some recent data might be hidden    Fall/Depression Screening: Fall Risk  02/20/2021 01/10/2021 11/07/2020  Falls in the past year? 0 0 0  Comment - - -  Number falls in past yr: - - -  Injury with Fall? - - -  Risk for fall due to : - - -  Follow up - - -   PHQ 2/9 Scores 01/10/2021 11/07/2020 08/15/2020 03/30/2020 10/11/2019 10/12/2018 05/13/2018  PHQ - 2 Score 0 0 1 0 2 0 6  PHQ- 9 Score - - - - 17 - 20    Assessment:  Goals Addressed            This Visit's Progress   . THN-Make and Keep All Appointments   On track    Barriers: None Timeframe:  Long-Range Goal Priority:  High Start Date:   12/20/20                          Expected End Date:08/21/21              Follow Up Date 06/20/21  - ask family or friend for a ride    Why is this important?   Part of staying healthy is seeing the doctor for follow-up care.  If you forget your appointments, there are some things you can do to stay on track.    Notes: Family takes to appointments 03/22/21 patient continues to follow up with physicians as scheduled.  05/06/21 Family continues to assist with appointments.      . THN-Track and Manage Fluids and Swelling   On track    Barriers: Knowledge Timeframe:  Long-Range Goal Priority:  High Start Date:   12/20/20                           Expected End Date: 08/21/21           Follow Up Date 06/20/21   - track weight in diary - use salt in moderation - watch for swelling in feet, ankles and  legs every day - weigh myself daily    Why is this important?   It is important to check your weight daily and watch how much salt and liquids you have.  It will help you to manage your heart failure.    Notes: Patient weighs daily. 12/20/20 173 lbs 01/10/21- 171 lbs. 02/20/21 patient weight 165 lbs  03/22/21 patient weight today 170 lbs but patient reports weight up to 176 lbs earlier this week and MD increased her lasix. Patient states she has to call to report her weight today.  Reiterated the importance of a low salt diet.   05/06/21 Patient weight 168 lbs.  Reiterated heart failure management.         Plan: RN CM will follow up next week. Follow-up:  Patient agrees to Care Plan and Follow-up.   [name], RN, MSN [place] [facility] Care Management Coordinator Direct Line [phone] Cell [phone] Toll Free: [phone]  Fax: [phone]

## 2021-05-07 DIAGNOSIS — M545 Low back pain, unspecified: Secondary | ICD-10-CM | POA: Diagnosis not present

## 2021-05-07 DIAGNOSIS — J9621 Acute and chronic respiratory failure with hypoxia: Secondary | ICD-10-CM | POA: Diagnosis not present

## 2021-05-07 DIAGNOSIS — G8929 Other chronic pain: Secondary | ICD-10-CM | POA: Diagnosis not present

## 2021-05-07 DIAGNOSIS — I25118 Atherosclerotic heart disease of native coronary artery with other forms of angina pectoris: Secondary | ICD-10-CM | POA: Diagnosis not present

## 2021-05-07 DIAGNOSIS — I110 Hypertensive heart disease with heart failure: Secondary | ICD-10-CM | POA: Diagnosis not present

## 2021-05-07 DIAGNOSIS — K5520 Angiodysplasia of colon without hemorrhage: Secondary | ICD-10-CM | POA: Diagnosis not present

## 2021-05-07 DIAGNOSIS — K219 Gastro-esophageal reflux disease without esophagitis: Secondary | ICD-10-CM | POA: Diagnosis not present

## 2021-05-07 DIAGNOSIS — J439 Emphysema, unspecified: Secondary | ICD-10-CM | POA: Diagnosis not present

## 2021-05-07 DIAGNOSIS — I5033 Acute on chronic diastolic (congestive) heart failure: Secondary | ICD-10-CM | POA: Diagnosis not present

## 2021-05-07 NOTE — Progress Notes (Signed)
Pt is scheduled at 11:00 AM 05/10/21 with Dr. Harrington Challenger.

## 2021-05-07 NOTE — Progress Notes (Signed)
Is there room to add her in on Friday?

## 2021-05-08 DIAGNOSIS — J449 Chronic obstructive pulmonary disease, unspecified: Secondary | ICD-10-CM | POA: Diagnosis not present

## 2021-05-08 DIAGNOSIS — I5033 Acute on chronic diastolic (congestive) heart failure: Secondary | ICD-10-CM | POA: Diagnosis not present

## 2021-05-08 DIAGNOSIS — N1831 Chronic kidney disease, stage 3a: Secondary | ICD-10-CM | POA: Diagnosis not present

## 2021-05-08 DIAGNOSIS — J441 Chronic obstructive pulmonary disease with (acute) exacerbation: Secondary | ICD-10-CM | POA: Diagnosis not present

## 2021-05-09 DIAGNOSIS — K219 Gastro-esophageal reflux disease without esophagitis: Secondary | ICD-10-CM | POA: Diagnosis not present

## 2021-05-09 DIAGNOSIS — K5520 Angiodysplasia of colon without hemorrhage: Secondary | ICD-10-CM | POA: Diagnosis not present

## 2021-05-09 DIAGNOSIS — J439 Emphysema, unspecified: Secondary | ICD-10-CM | POA: Diagnosis not present

## 2021-05-09 DIAGNOSIS — M545 Low back pain, unspecified: Secondary | ICD-10-CM | POA: Diagnosis not present

## 2021-05-09 DIAGNOSIS — I110 Hypertensive heart disease with heart failure: Secondary | ICD-10-CM | POA: Diagnosis not present

## 2021-05-09 DIAGNOSIS — J9621 Acute and chronic respiratory failure with hypoxia: Secondary | ICD-10-CM | POA: Diagnosis not present

## 2021-05-09 DIAGNOSIS — G8929 Other chronic pain: Secondary | ICD-10-CM | POA: Diagnosis not present

## 2021-05-09 DIAGNOSIS — I5033 Acute on chronic diastolic (congestive) heart failure: Secondary | ICD-10-CM | POA: Diagnosis not present

## 2021-05-09 DIAGNOSIS — I25118 Atherosclerotic heart disease of native coronary artery with other forms of angina pectoris: Secondary | ICD-10-CM | POA: Diagnosis not present

## 2021-05-09 NOTE — Progress Notes (Signed)
Cardiology Office Note   Date:  05/10/2021   ID:  [name], [name], MRN [id number]  PCP:  [name], MD  Cardiologist:   Dorris Carnes, MD     [name] is a 80 y.o. female with CAD, diastolic CHF, CV dz, DVT/PE, GI bleeding, PAF, COPD who is being admitted due to increased SOB   History of Present Illness:   [name] is a 80 y.o.femalewith:  CAD (s/p CABG 2004; DES to LM in 2010; myovue in 2018:  Low risk with distal anterior and apical ischemia), chronic diastolic CHF, CV dz, PAD recurrent DVT/PE, GI bleeding on anticoagulation  COPD, PAF (after CABG), GERD, HL, HTN, renal insufficiency, sleep apnea (not on CPAP)      The pt was admitted to [facility] with sepsis/pneumonia. In October 2021  She came back to the ED in November 2021 with hypotension, weakness   Renal funciton was down  Cr 2.2   Diuretics held initially  She wasthe  diuresed and then eventually sent home on oral lasix 40 bid.    The pt was seen by Tonny Branch a few days after d/c   Was feeling good at that visit   Said her breathing was OK    On review of records, she continued to follow with [facility] after this visti  Was on lasix    At some point it appears that it may have been stopped  I saw hier eariler in march   Was very short of breath and edematous   I recomm she get admitted for IV diuresis    She left the hosp on 81/17/22   I saw [name] at that time she was doing better.  I reinforced daily weights.  Patient presented to the emergency room on 5 9 with 2 days worsening shortness of breath productive cough of yellow sputum.  She was admitted for a COPD exacerbation, diastolic CHF exacerbation. Patient was treated with IV steroids, IV Lasix, doxycycline, nebulizers.  She was tapered down and went home on 81  (3 L O2, 94% sat).  Since discharge the patient has continued to dwindle.  She still is short of breath.  She still notes lower extremity edema.  She  was seen by Dr. Blenda Mounts her primary physician on Wednesday.  He felt she had left the hospital too early as she was still wheezing and edematous.  He gave her an injection of Solu-Medrol IM.  He also increased her Lasix to 80 mg a.m. 40 mg p.m.  Since being seen in internal medicine clinic she continues to feel poorly she says she is still short of breath, she is tired, she still has lower extremity edema.  Current Meds  Medication Sig  . allopurinol (ZYLOPRIM) 100 MG tablet Take 100 mg by mouth daily with breakfast.  . ARIPiprazole (ABILIFY) 2 MG tablet Take 2 mg by mouth daily with breakfast.  . aspirin EC 81 MG tablet Take 81 mg by mouth daily with breakfast. Swallow whole.  Marland Kitchen atorvastatin (LIPITOR) 20 MG tablet Take 20 mg by mouth daily with supper.  . cholecalciferol (VITAMIN D3) 25 MCG (1000 UNIT) tablet Take 1,000 Units by mouth daily with supper.  . citalopram (CELEXA) 40 MG tablet Take 40 mg by mouth daily with supper.  . cyclobenzaprine (FLEXERIL) 10 MG tablet Take 10 mg by mouth.  . ferrous sulfate 325 (65 FE) MG tablet Take 325 mg by mouth  daily with breakfast.  . furosemide (LASIX) 40 MG tablet Take 120 mg by mouth daily. 80 mg in the morning 40 mg in the evening  . gabapentin (NEURONTIN) 100 MG capsule Take 100 mg by mouth daily with breakfast.  . Ipratropium-Albuterol (COMBIVENT RESPIMAT) 20-100 MCG/ACT AERS respimat Inhale 1 puff into the lungs 3 (three) times daily as needed for wheezing or shortness of breath.  . levothyroxine (SYNTHROID) 50 MCG tablet Take 1 tablet (50 mcg total) by mouth daily at 6 (six) AM.  . magnesium oxide (MAG-OX) 400 (241.3 Mg) MG tablet Take 1 tablet (400 mg total) by mouth 2 (two) times daily.  . Melatonin 10 MG TABS Take 30 mg by mouth at bedtime.  . nitroGLYCERIN (NITROSTAT) 0.4 MG SL tablet Place 1 tablet (0.4 mg total) under the tongue every 5 (five) minutes as needed for chest pain. For chest pain  . omeprazole (PRILOSEC) 40 MG capsule TAKE 1  CAPSULE BY MOUTH 2 TIMES DAILY.  Marland Kitchen OXYGEN Inhale 4 L/min into the lungs as needed (DURING ALL TIMES OF EXERTION).   Marland Kitchen pilocarpine (PILOCAR) 1 % ophthalmic solution Place 1 drop into both eyes 2 (two) times daily.  . potassium chloride SA (KLOR-CON) 20 MEQ tablet Take 20 mEq by mouth daily with lunch.  . pramipexole (MIRAPEX) 0.25 MG tablet Take 0.25 mg by mouth daily with supper.  . predniSONE (DELTASONE) 5 MG tablet Label  & dispense according to the schedule below. take 8 Pills PO for 3 days, 6 Pills PO for 3 days, 4 Pills PO for 3 days, 2 Pills PO for 3 days, 1 Pills PO for 3 days, 1/2 Pill  PO for 3 days then STOP. Total 65 pills.  . roflumilast (DALIRESP) 500 MCG TABS tablet Take 500 mcg by mouth daily with breakfast.  . spironolactone (ALDACTONE) 25 MG tablet Take 0.5 tablets (12.5 mg total) by mouth daily.  . traZODone (DESYREL) 50 MG tablet Take 150 mg by mouth at bedtime.   . vitamin C (ASCORBIC ACID) 500 MG tablet Take 500 mg by mouth daily with lunch.  Ginger Organ ER 9 MG C12A Take 9 mg by mouth 2 (two) times daily.     Allergies:   Ativan [lorazepam], Pitavastatin, Ropinirole, Zofran [ondansetron hcl], Zolpidem tartrate, and Penicillins   Past Medical History:  Diagnosis Date  . Anemia    bld. transfusion post lumbar surgery- 2012  . Anxiety   . Arthralgia    NOS  . Blood transfusion 2012; 02/2018   WITH BACK SURGERY; "related to hematoma in stomach" (10/06/2018)  . CAD (coronary artery disease)    s/p CABG 2004; s/p DES to LM in 2010;  [place] 10/29/11: EF 50-55%, mild elevated filling pressures, no pulmonary HTN, LM 90% ISR, LAD and CFX occluded, S-RI occluded (old), S-OM3 ok and L-LAD ok, native nondominant RCA 95% -  med rx recommended ; Lexiscan Myoview 7/13 at [facility]: demonstrated "normal LV function, anterior attenuation and localized ischemia, inferior, basilar, mid section"  . Carotid artery disease ([place])    Carotid US 8/67:  RICA 6-19; LICA 50-93; R subclavian  stenosis - Repeat 1 year. // Carotid US 05/2019: R 1-39; L 40-59; R vertebral with atypical antegrade flow; R subclavian stenosis >> repeat 1 year // Carotid US 7/21: Bilat 40-59; L subclavian stenosis   . CHF (congestive heart failure) ([place]) 01/21/2020  . Chronic diastolic heart failure (HCC)    Echo 9/10: EF 26-71%, grade 1 diastolic dysfunction  . Chronic lower  back pain   . COPD (chronic obstructive pulmonary disease) ([place])    Emphysema dxed by Dr. Woody Seller in [place] based on PFTs per pt in 2006; placed on albuterol  . Depression    TAKES CELEXA  AND  (OFF- WELBUTRIN)  . Diuretic-induced hypokalemia 10/08/2018  . DVT of lower extremity (deep venous thrombosis) (HCC)    recurrent. bilateral (2 episodes)  . Dyspnea    home o2 when needed  . Dysrhythmia    afib with cabg  . Exertional angina (HCC)    Treated with Isosorbide, Ranexa, amlodipine; intolerant to metoprolol  . GERD (gastroesophageal reflux disease)   . Gout    "on daily RX" (10/06/2018)  . HLD (hyperlipidemia)   . Hypertension   . Hyperthyroidism   . L Subclavian Artery Stenosis    Carotid US 7/21   . Obesity (BMI 30-39.9) 2009   BMI 33  . Osteoarthritis    "all over; hands" (10/06/2018)  . Oxygen deficiency   . Oxygen dependent    4L at home as needed (10/06/2018)  . Pneumonia   . PVD (peripheral vascular disease) ([place])   . Sleep apnea 2012   USED CPAP THEN  STARTED USING SPIRIVA , Nov. 2013- last evaluation , changed from mask to aparatus that is just for her nose.. ; reports 05-28-18 "the mask smothers me so i dont use it right now" (10/06/2018)    Past Surgical History:  Procedure Laterality Date  . ANKLE SURGERY  2004  . ANTERIOR CERVICAL DECOMP/DISCECTOMY FUSION  11/2007  . AUGMENTATION MAMMAPLASTY    . [place]   lower; another scheduled, opt. reports 4 back- lumbar, 3 cerv. fusions  for later 2009  . CARDIAC CATHETERIZATION  11/2009   Patent LIMA to LAD and patent SVG to OM1. Occluded SVG to  ramus and diagonal. Left main: 90% ostial stenosis, LCX 60-70% proximal stenosis  . CATARACT EXTRACTION W/ INTRAOCULAR LENS  IMPLANT, BILATERAL Bilateral 2006  . COLONOSCOPY WITH PROPOFOL N/A 02/17/2017   Procedure: COLONOSCOPY WITH PROPOFOL;  Surgeon: Jerene Bears, MD;  Location: [facility] ENDOSCOPY;  Service: Endoscopy;  Laterality: N/A;  . CORONARY ANGIOPLASTY WITH STENT PLACEMENT  11/2009   Drug eluting stent to left main artery: 4.0 X 12 mm Ion   . CORONARY ARTERY BYPASS GRAFT  2004   "CABG X4"  . ESOPHAGOGASTRODUODENOSCOPY N/A 02/14/2017   Procedure: ESOPHAGOGASTRODUODENOSCOPY (EGD);  Surgeon: Doran Stabler, MD;  Location: [facility] ENDOSCOPY;  Service: Endoscopy;  Laterality: N/A;  . GIVENS CAPSULE STUDY N/A 02/15/2017   Procedure: GIVENS CAPSULE STUDY;  Surgeon: Doran Stabler, MD;  Location: [place];  Service: Endoscopy;  Laterality: N/A;  . GROIN MASS OPEN BIOPSY  2004  . IR IVC FILTER PLMT / S&I /IMG GUID/MOD SED  03/14/2018  . IR PARACENTESIS  03/10/2018  . JOINT REPLACEMENT    . LAPAROSCOPIC CHOLECYSTECTOMY    . PERIPHERAL VASCULAR INTERVENTION Left 03/05/2018   Procedure: PERIPHERAL VASCULAR INTERVENTION;  Surgeon: Elam Dutch, MD;  Location: [place] CV LAB;  Service: Cardiovascular;  Laterality: Left;  Attempted unsuccess\ful Per Dr. Eden Lathe  . PERIPHERAL VASCULAR INTERVENTION  03/08/2018   Procedure: PERIPHERAL VASCULAR INTERVENTION;  Surgeon: Waynetta Sandy, MD;  Location: [place] CV LAB;  Service: Cardiovascular;;  SMA Stent   . REPLACEMENT TOTAL KNEE BILATERAL Bilateral 2012  . REVERSE SHOULDER ARTHROPLASTY  02/26/2012   Procedure: REVERSE SHOULDER ARTHROPLASTY;  Surgeon: Marin Shutter, MD;  Location: [place];  Service: Orthopedics;  Laterality: Right;  RIGHT SHOULDER REVERSED ARTHROPLASTY  . SHOULDER ARTHROSCOPY WITH SUBACROMIAL DECOMPRESSION Left 02/10/2013   Procedure: SHOULDER ARTHROSCOPY WITH SUBACROMIAL DECOMPRESSION DISTAL CLAVICLE RESECTION;  Surgeon:  Marin Shutter, MD;  Location: [place];  Service: Orthopedics;  Laterality: Left;  DISTAL CLAVICLE RESECTION  . TONSILLECTOMY    . TOTAL HIP ARTHROPLASTY  2007   Right  . TOTAL HIP ARTHROPLASTY Left 02/07/2020   Procedure: TOTAL HIP ARTHROPLASTY ANTERIOR APPROACH;  Surgeon: Paralee Cancel, MD;  Location: [facility];  Service: Orthopedics;  Laterality: Left;  70 mins  . TRANSURETHRAL RESECTION OF BLADDER TUMOR N/A 05/31/2018   Procedure: TRANSURETHRAL RESECTION OF BLADDER TUMOR (TURBT);  Surgeon: Lucas Mallow, MD;  Location: [facility];  Service: Urology;  Laterality: N/A;  . VISCERAL ANGIOGRAPHY N/A 03/05/2018   Procedure: VISCERAL ANGIOGRAPHY;  Surgeon: Elam Dutch, MD;  Location: [place] [facility];  Service: Cardiovascular;  Laterality: N/A;  . VISCERAL ANGIOGRAPHY N/A 03/08/2018   Procedure: VISCERAL ANGIOGRAPHY;  Surgeon: Waynetta Sandy, MD;  Location: [place] [facility];  Service: Cardiovascular;  Laterality: N/A;     Social History:  The patient  reports that she quit smoking about 20 years ago. Her smoking use included cigarettes. She has a 45.00 pack-year smoking history. She has never used smokeless tobacco. She reports that she does not drink alcohol and does not use drugs.   Family History:  The patient's family history includes COPD in her father; Colitis in her sister; Heart disease in her father and sister; Lung cancer in her father; Ovarian cancer in her mother; Stomach cancer in her sister.    ROS:  Please see the history of present illness. All other systems are reviewed and  Negative to the above problem except as noted.    PHYSICAL EXAM: VS:  BP 138/72   Pulse (!) 102   Ht 5\' 8"  (1.727 m)   Wt 172 lb 6.4 oz (78.2 kg)   SpO2 97%   BMI 26.21 kg/m   GEN: Thin 80 year old woman examined in chair.  On O2 (3 L).  Appears fatigued HEENT: normal  Neck: no obvious JVD while sitting in chair.  Is using accessory muscle some. Cardiac: RRR; no murmurs, rubs, or  gallops, 1+ LE edema  Respiratory: Decreased airflow bilaterally with wheezes.  Mild crackles at bases. GI: soft, nontender, nondistended, + BS  No hepatomegaly  MS: no deformity Moving all extremities   Skin: warm and dry, no rash Neuro:  CN II to XII intact   Psych: euthymic mood, full affect   EKG:  EKG is not  ordered today.   Lipid Panel    Component Value Date/Time   CHOL 89 (L) 03/08/2020 1158   CHOL 98 05/23/2014 0857   TRIG 76 03/08/2020 1158   TRIG 83 05/23/2014 0857   HDL 37 (L) 03/08/2020 1158   HDL 35 (L) 05/23/2014 0857   CHOLHDL 2.4 03/08/2020 1158   CHOLHDL 4 03/03/2013 1136   VLDL 24.4 03/03/2013 1136   LDLCALC 36 03/08/2020 1158   LDLCALC 46 05/23/2014 0857      Wt Readings from Last 3 Encounters:  05/10/21 172 lb 6.4 oz (78.2 kg)  05/03/21 171 lb 15.3 oz (78 kg)  02/21/21 170 lb 12.8 oz (77.5 kg)      ASSESSMENT AND PLAN: 1.  COPD.  Patient's breathing still is not good.  I have listened to her in the past when it was much better.  I have spoken to her primary physician again on the phone today reviewed this both he and I are concerned about continued outpatient treatment she has had renal dysfunction develop with continued IV diuresis.  We both recommend that she return to the emergency room for further evaluation possible admission.  Note Dr. Blenda Mounts did do blood work on Wednesday white count was elevated as expected with steroids but there was more of a left shift.  He is concerned could there be any atypical infection.  2 Acute on chronic diastllic CHF volume is up today on exam.  Again she would benefit from IV diuresis I think as well as close lab evaluation during that time.  3 CAD  Last intervention in 2010  Myovue normal in  2018   I am not convinced of active angina. 4  HTN  BP is controlled      Current medicines are reviewed at length with the patient today.  The patient does not have concerns regarding medicines.  Signed, Dorris Carnes,  MD  05/10/2021 12:10 PM    [facility] [place], [place], [place]  [address] Phone: [phone]; Fax: [phone]

## 2021-05-10 ENCOUNTER — Other Ambulatory Visit: Payer: Self-pay

## 2021-05-10 ENCOUNTER — Ambulatory Visit (INDEPENDENT_AMBULATORY_CARE_PROVIDER_SITE_OTHER): Payer: Medicare HMO | Admitting: Internal Medicine

## 2021-05-10 ENCOUNTER — Emergency Department (HOSPITAL_COMMUNITY): Payer: Medicare HMO

## 2021-05-10 ENCOUNTER — Encounter: Payer: Self-pay | Admitting: Internal Medicine

## 2021-05-10 ENCOUNTER — Inpatient Hospital Stay (HOSPITAL_COMMUNITY)
Admission: EM | Admit: 2021-05-10 | Discharge: 2021-05-13 | DRG: 291 | Disposition: A | Discharge status: Home Health | Payer: Medicare HMO | Attending: Internal Medicine | Admitting: Internal Medicine

## 2021-05-10 ENCOUNTER — Encounter (HOSPITAL_COMMUNITY): Payer: Self-pay

## 2021-05-10 VITALS — BP 138/72 | HR 102 | Ht 68.0 in | Wt 172.4 lb

## 2021-05-10 DIAGNOSIS — Z86718 Personal history of other venous thrombosis and embolism: Secondary | ICD-10-CM | POA: Diagnosis not present

## 2021-05-10 DIAGNOSIS — I110 Hypertensive heart disease with heart failure: Principal | ICD-10-CM | POA: Diagnosis present

## 2021-05-10 DIAGNOSIS — Z955 Presence of coronary angioplasty implant and graft: Secondary | ICD-10-CM

## 2021-05-10 DIAGNOSIS — I5033 Acute on chronic diastolic (congestive) heart failure: Secondary | ICD-10-CM | POA: Diagnosis present

## 2021-05-10 DIAGNOSIS — Z9981 Dependence on supplemental oxygen: Secondary | ICD-10-CM | POA: Diagnosis not present

## 2021-05-10 DIAGNOSIS — Z86711 Personal history of pulmonary embolism: Secondary | ICD-10-CM | POA: Diagnosis not present

## 2021-05-10 DIAGNOSIS — R778 Other specified abnormalities of plasma proteins: Secondary | ICD-10-CM | POA: Diagnosis not present

## 2021-05-10 DIAGNOSIS — J441 Chronic obstructive pulmonary disease with (acute) exacerbation: Secondary | ICD-10-CM | POA: Diagnosis present

## 2021-05-10 DIAGNOSIS — I5032 Chronic diastolic (congestive) heart failure: Secondary | ICD-10-CM

## 2021-05-10 DIAGNOSIS — Z87891 Personal history of nicotine dependence: Secondary | ICD-10-CM

## 2021-05-10 DIAGNOSIS — R9431 Abnormal electrocardiogram [ECG] [EKG]: Secondary | ICD-10-CM | POA: Diagnosis not present

## 2021-05-10 DIAGNOSIS — Z9114 Patient's other noncompliance with medication regimen: Secondary | ICD-10-CM

## 2021-05-10 DIAGNOSIS — I5031 Acute diastolic (congestive) heart failure: Secondary | ICD-10-CM | POA: Diagnosis not present

## 2021-05-10 DIAGNOSIS — Z96611 Presence of right artificial shoulder joint: Secondary | ICD-10-CM | POA: Diagnosis present

## 2021-05-10 DIAGNOSIS — Z9111 Patient's noncompliance with dietary regimen: Secondary | ICD-10-CM | POA: Diagnosis not present

## 2021-05-10 DIAGNOSIS — Z981 Arthrodesis status: Secondary | ICD-10-CM

## 2021-05-10 DIAGNOSIS — E785 Hyperlipidemia, unspecified: Secondary | ICD-10-CM | POA: Diagnosis present

## 2021-05-10 DIAGNOSIS — M1A09X0 Idiopathic chronic gout, multiple sites, without tophus (tophi): Secondary | ICD-10-CM | POA: Diagnosis not present

## 2021-05-10 DIAGNOSIS — R54 Age-related physical debility: Secondary | ICD-10-CM | POA: Diagnosis present

## 2021-05-10 DIAGNOSIS — Z880 Allergy status to penicillin: Secondary | ICD-10-CM

## 2021-05-10 DIAGNOSIS — Z888 Allergy status to other drugs, medicaments and biological substances status: Secondary | ICD-10-CM

## 2021-05-10 DIAGNOSIS — I739 Peripheral vascular disease, unspecified: Secondary | ICD-10-CM | POA: Diagnosis present

## 2021-05-10 DIAGNOSIS — Z825 Family history of asthma and other chronic lower respiratory diseases: Secondary | ICD-10-CM

## 2021-05-10 DIAGNOSIS — I2510 Atherosclerotic heart disease of native coronary artery without angina pectoris: Secondary | ICD-10-CM | POA: Diagnosis present

## 2021-05-10 DIAGNOSIS — I208 Other forms of angina pectoris: Secondary | ICD-10-CM | POA: Diagnosis not present

## 2021-05-10 DIAGNOSIS — J42 Unspecified chronic bronchitis: Secondary | ICD-10-CM | POA: Diagnosis not present

## 2021-05-10 DIAGNOSIS — Z96653 Presence of artificial knee joint, bilateral: Secondary | ICD-10-CM | POA: Diagnosis present

## 2021-05-10 DIAGNOSIS — Z8041 Family history of malignant neoplasm of ovary: Secondary | ICD-10-CM

## 2021-05-10 DIAGNOSIS — M7989 Other specified soft tissue disorders: Secondary | ICD-10-CM | POA: Diagnosis not present

## 2021-05-10 DIAGNOSIS — Z7982 Long term (current) use of aspirin: Secondary | ICD-10-CM

## 2021-05-10 DIAGNOSIS — Z801 Family history of malignant neoplasm of trachea, bronchus and lung: Secondary | ICD-10-CM

## 2021-05-10 DIAGNOSIS — Z20822 Contact with and (suspected) exposure to covid-19: Secondary | ICD-10-CM | POA: Diagnosis not present

## 2021-05-10 DIAGNOSIS — R079 Chest pain, unspecified: Secondary | ICD-10-CM | POA: Diagnosis not present

## 2021-05-10 DIAGNOSIS — Z79899 Other long term (current) drug therapy: Secondary | ICD-10-CM

## 2021-05-10 DIAGNOSIS — Z96642 Presence of left artificial hip joint: Secondary | ICD-10-CM | POA: Diagnosis present

## 2021-05-10 DIAGNOSIS — J9621 Acute and chronic respiratory failure with hypoxia: Secondary | ICD-10-CM | POA: Diagnosis not present

## 2021-05-10 DIAGNOSIS — Z800 Family history of malignant neoplasm of digestive organs: Secondary | ICD-10-CM

## 2021-05-10 DIAGNOSIS — I509 Heart failure, unspecified: Secondary | ICD-10-CM

## 2021-05-10 DIAGNOSIS — I517 Cardiomegaly: Secondary | ICD-10-CM | POA: Diagnosis not present

## 2021-05-10 DIAGNOSIS — Z79890 Hormone replacement therapy (postmenopausal): Secondary | ICD-10-CM | POA: Diagnosis not present

## 2021-05-10 DIAGNOSIS — G2581 Restless legs syndrome: Secondary | ICD-10-CM | POA: Diagnosis not present

## 2021-05-10 DIAGNOSIS — J449 Chronic obstructive pulmonary disease, unspecified: Secondary | ICD-10-CM | POA: Diagnosis not present

## 2021-05-10 DIAGNOSIS — E039 Hypothyroidism, unspecified: Secondary | ICD-10-CM | POA: Diagnosis not present

## 2021-05-10 DIAGNOSIS — M1A9XX0 Chronic gout, unspecified, without tophus (tophi): Secondary | ICD-10-CM | POA: Diagnosis present

## 2021-05-10 DIAGNOSIS — G8929 Other chronic pain: Secondary | ICD-10-CM

## 2021-05-10 DIAGNOSIS — Z8249 Family history of ischemic heart disease and other diseases of the circulatory system: Secondary | ICD-10-CM

## 2021-05-10 DIAGNOSIS — R0602 Shortness of breath: Secondary | ICD-10-CM | POA: Diagnosis not present

## 2021-05-10 LAB — CBC WITH DIFFERENTIAL/PLATELET
Abs Immature Granulocytes: 0.71 10*3/uL — ABNORMAL HIGH (ref 0.00–0.07)
Basophils Absolute: 0.1 10*3/uL (ref 0.0–0.1)
Basophils Relative: 0 %
Eosinophils Absolute: 0 10*3/uL (ref 0.0–0.5)
Eosinophils Relative: 0 %
HCT: 39.4 % (ref 36.0–46.0)
Hemoglobin: 11.9 g/dL — ABNORMAL LOW (ref 12.0–15.0)
Immature Granulocytes: 4 %
Lymphocytes Relative: 7 %
Lymphs Abs: 1.3 10*3/uL (ref 0.7–4.0)
MCH: 27.2 pg (ref 26.0–34.0)
MCHC: 30.2 g/dL (ref 30.0–36.0)
MCV: 90.2 fL (ref 80.0–100.0)
Monocytes Absolute: 1.4 10*3/uL — ABNORMAL HIGH (ref 0.1–1.0)
Monocytes Relative: 8 %
Neutro Abs: 14.6 10*3/uL — ABNORMAL HIGH (ref 1.7–7.7)
Neutrophils Relative %: 81 %
Platelets: 248 10*3/uL (ref 150–400)
RBC: 4.37 MIL/uL (ref 3.87–5.11)
RDW: 16.1 % — ABNORMAL HIGH (ref 11.5–15.5)
WBC: 18.1 10*3/uL — ABNORMAL HIGH (ref 4.0–10.5)
nRBC: 0 % (ref 0.0–0.2)

## 2021-05-10 LAB — TROPONIN I (HIGH SENSITIVITY)
Troponin I (High Sensitivity): 212 ng/L (ref <18)
Troponin I (High Sensitivity): 197 ng/L (ref <18)

## 2021-05-10 LAB — LACTIC ACID, PLASMA
Lactic Acid, Venous: 1.3 mmol/L (ref 0.5–1.9)
Lactic Acid, Venous: 1.7 mmol/L (ref 0.5–1.9)

## 2021-05-10 LAB — MAGNESIUM: Magnesium: 2.6 mg/dL — ABNORMAL HIGH (ref 1.7–2.4)

## 2021-05-10 LAB — BASIC METABOLIC PANEL
Anion gap: 6 (ref 5–15)
BUN: 30 mg/dL — ABNORMAL HIGH (ref 8–23)
CO2: 35 mmol/L — ABNORMAL HIGH (ref 22–32)
Calcium: 8 mg/dL — ABNORMAL LOW (ref 8.9–10.3)
Chloride: 97 mmol/L — ABNORMAL LOW (ref 98–111)
Creatinine, Ser: 1.11 mg/dL — ABNORMAL HIGH (ref 0.44–1.00)
GFR, Estimated: 50 mL/min — ABNORMAL LOW (ref >60)
Glucose, Bld: 108 mg/dL — ABNORMAL HIGH (ref 70–99)
Potassium: 4.6 mmol/L (ref 3.5–5.1)
Sodium: 138 mmol/L (ref 135–145)

## 2021-05-10 LAB — RESP PANEL BY RT-PCR (FLU A&B, COVID) ARPGX2
Influenza A by PCR: NEGATIVE
Influenza B by PCR: NEGATIVE
SARS Coronavirus 2 by RT PCR: NEGATIVE

## 2021-05-10 LAB — D-DIMER, QUANTITATIVE: D-Dimer, Quant: 3.23 ug/mL-FEU — ABNORMAL HIGH (ref 0.00–0.50)

## 2021-05-10 LAB — BRAIN NATRIURETIC PEPTIDE: B Natriuretic Peptide: 471.5 pg/mL — ABNORMAL HIGH (ref 0.0–100.0)

## 2021-05-10 MED ORDER — MELATONIN 5 MG PO TABS
10.0000 mg | ORAL_TABLET | Freq: Every day | ORAL | Status: DC
  Administered 2021-05-11 – 2021-05-12 (×3): 10 mg via ORAL
  Filled 2021-05-10 (×3): qty 2

## 2021-05-10 MED ORDER — GABAPENTIN 100 MG PO CAPS
100.0000 mg | ORAL_CAPSULE | Freq: Every day | ORAL | Status: DC
  Administered 2021-05-11 – 2021-05-13 (×3): 100 mg via ORAL
  Filled 2021-05-10 (×3): qty 1

## 2021-05-10 MED ORDER — FUROSEMIDE 10 MG/ML IJ SOLN
40.0000 mg | Freq: Two times a day (BID) | INTRAMUSCULAR | Status: DC
  Administered 2021-05-11 – 2021-05-12 (×3): 40 mg via INTRAVENOUS
  Filled 2021-05-10 (×3): qty 4

## 2021-05-10 MED ORDER — POTASSIUM CHLORIDE CRYS ER 20 MEQ PO TBCR
20.0000 meq | EXTENDED_RELEASE_TABLET | Freq: Every day | ORAL | Status: DC
  Administered 2021-05-11 – 2021-05-12 (×2): 20 meq via ORAL
  Filled 2021-05-10 (×2): qty 1

## 2021-05-10 MED ORDER — SPIRONOLACTONE 12.5 MG HALF TABLET
12.5000 mg | ORAL_TABLET | Freq: Every day | ORAL | Status: DC
  Administered 2021-05-11 – 2021-05-13 (×3): 12.5 mg via ORAL
  Filled 2021-05-10 (×4): qty 1

## 2021-05-10 MED ORDER — IOHEXOL 350 MG/ML SOLN
50.0000 mL | Freq: Once | INTRAVENOUS | Status: AC | PRN
  Administered 2021-05-10: 50 mL via INTRAVENOUS

## 2021-05-10 MED ORDER — LEVOTHYROXINE SODIUM 50 MCG PO TABS
50.0000 ug | ORAL_TABLET | Freq: Every day | ORAL | Status: DC
  Administered 2021-05-11 – 2021-05-13 (×3): 50 ug via ORAL
  Filled 2021-05-10 (×3): qty 1

## 2021-05-10 MED ORDER — MAGNESIUM OXIDE -MG SUPPLEMENT 400 (240 MG) MG PO TABS
400.0000 mg | ORAL_TABLET | Freq: Two times a day (BID) | ORAL | Status: DC
  Administered 2021-05-11 – 2021-05-13 (×6): 400 mg via ORAL
  Filled 2021-05-10 (×6): qty 1

## 2021-05-10 MED ORDER — OXYCODONE HCL ER 10 MG PO T12A
10.0000 mg | EXTENDED_RELEASE_TABLET | Freq: Once | ORAL | Status: AC
  Administered 2021-05-11: 10 mg via ORAL
  Filled 2021-05-10: qty 1

## 2021-05-10 MED ORDER — ASCORBIC ACID 500 MG PO TABS
500.0000 mg | ORAL_TABLET | Freq: Every day | ORAL | Status: DC
  Administered 2021-05-11 – 2021-05-13 (×3): 500 mg via ORAL
  Filled 2021-05-10 (×3): qty 1

## 2021-05-10 MED ORDER — PANTOPRAZOLE SODIUM 40 MG PO TBEC
40.0000 mg | DELAYED_RELEASE_TABLET | Freq: Every day | ORAL | Status: DC
  Administered 2021-05-11 – 2021-05-13 (×3): 40 mg via ORAL
  Filled 2021-05-10 (×3): qty 1

## 2021-05-10 MED ORDER — OXYCODONE HCL ER 10 MG PO T12A
10.0000 mg | EXTENDED_RELEASE_TABLET | Freq: Two times a day (BID) | ORAL | Status: DC
  Administered 2021-05-11 – 2021-05-13 (×5): 10 mg via ORAL
  Filled 2021-05-10 (×5): qty 1

## 2021-05-10 MED ORDER — ARIPIPRAZOLE 2 MG PO TABS: 2.0000 mg | ORAL_TABLET | Freq: Every day | ORAL | Status: DC

## 2021-05-10 MED ORDER — CITALOPRAM HYDROBROMIDE 10 MG PO TABS: 40.0000 mg | ORAL_TABLET | Freq: Every day | ORAL | Status: DC

## 2021-05-10 MED ORDER — CYCLOBENZAPRINE HCL 10 MG PO TABS
10.0000 mg | ORAL_TABLET | Freq: Every day | ORAL | Status: DC
  Administered 2021-05-11 – 2021-05-13 (×3): 10 mg via ORAL
  Filled 2021-05-10 (×3): qty 1

## 2021-05-10 MED ORDER — ATORVASTATIN CALCIUM 10 MG PO TABS
20.0000 mg | ORAL_TABLET | Freq: Every day | ORAL | Status: DC
  Administered 2021-05-11 – 2021-05-12 (×2): 20 mg via ORAL
  Filled 2021-05-10 (×2): qty 2

## 2021-05-10 MED ORDER — IPRATROPIUM-ALBUTEROL 0.5-2.5 (3) MG/3ML IN SOLN
3.0000 mL | Freq: Four times a day (QID) | RESPIRATORY_TRACT | Status: DC
  Administered 2021-05-11 (×4): 3 mL via RESPIRATORY_TRACT
  Filled 2021-05-10 (×4): qty 3

## 2021-05-10 MED ORDER — ACETAMINOPHEN 325 MG PO TABS: 650.0000 mg | ORAL_TABLET | ORAL | Status: DC | PRN

## 2021-05-10 MED ORDER — ASPIRIN 81 MG PO CHEW
324.0000 mg | CHEWABLE_TABLET | Freq: Once | ORAL | Status: AC
  Administered 2021-05-10: 324 mg via ORAL

## 2021-05-10 MED ORDER — ROFLUMILAST 500 MCG PO TABS
500.0000 ug | ORAL_TABLET | Freq: Every day | ORAL | Status: DC
  Administered 2021-05-11 – 2021-05-13 (×3): 500 ug via ORAL
  Filled 2021-05-10 (×4): qty 1

## 2021-05-10 MED ORDER — FERROUS SULFATE 325 (65 FE) MG PO TABS
325.0000 mg | ORAL_TABLET | Freq: Every day | ORAL | Status: DC
  Administered 2021-05-11 – 2021-05-13 (×3): 325 mg via ORAL
  Filled 2021-05-10 (×3): qty 1

## 2021-05-10 MED ORDER — VITAMIN D 25 MCG (1000 UNIT) PO TABS
1000.0000 [IU] | ORAL_TABLET | Freq: Every day | ORAL | Status: DC
  Administered 2021-05-11 – 2021-05-12 (×2): 1000 [IU] via ORAL
  Filled 2021-05-10 (×2): qty 1

## 2021-05-10 MED ORDER — ALLOPURINOL 100 MG PO TABS
100.0000 mg | ORAL_TABLET | Freq: Every day | ORAL | Status: DC
  Administered 2021-05-11 – 2021-05-13 (×3): 100 mg via ORAL
  Filled 2021-05-10 (×4): qty 1

## 2021-05-10 MED ORDER — NITROGLYCERIN 0.4 MG SL SUBL: 0.4000 mg | SUBLINGUAL_TABLET | SUBLINGUAL | Status: DC | PRN

## 2021-05-10 MED ORDER — PRAMIPEXOLE DIHYDROCHLORIDE 0.25 MG PO TABS
0.2500 mg | ORAL_TABLET | Freq: Every day | ORAL | Status: DC
  Administered 2021-05-11 – 2021-05-12 (×2): 0.25 mg via ORAL
  Filled 2021-05-10 (×3): qty 1

## 2021-05-10 MED ORDER — ASPIRIN 81 MG PO CHEW
CHEWABLE_TABLET | ORAL | Status: AC
  Filled 2021-05-10: qty 4

## 2021-05-10 MED ORDER — TRAZODONE HCL 50 MG PO TABS
150.0000 mg | ORAL_TABLET | Freq: Every day | ORAL | Status: DC
  Administered 2021-05-11 – 2021-05-12 (×3): 150 mg via ORAL
  Filled 2021-05-10: qty 3
  Filled 2021-05-10 (×2): qty 1

## 2021-05-10 MED ORDER — HEPARIN BOLUS VIA INFUSION
4500.0000 [IU] | Freq: Once | INTRAVENOUS | Status: AC
  Administered 2021-05-10: 4500 [IU] via INTRAVENOUS
  Filled 2021-05-10: qty 4500

## 2021-05-10 MED ORDER — ASPIRIN EC 81 MG PO TBEC
81.0000 mg | DELAYED_RELEASE_TABLET | Freq: Every day | ORAL | Status: DC
  Administered 2021-05-11 – 2021-05-13 (×3): 81 mg via ORAL
  Filled 2021-05-10 (×3): qty 1

## 2021-05-10 MED ORDER — HEPARIN (PORCINE) 25000 UT/250ML-% IV SOLN
1200.0000 [IU]/h | INTRAVENOUS | Status: DC
  Administered 2021-05-10: 1200 [IU]/h via INTRAVENOUS
  Administered 2021-05-11: 1300 [IU]/h via INTRAVENOUS
  Filled 2021-05-10 (×2): qty 250

## 2021-05-10 NOTE — Progress Notes (Signed)
ANTICOAGULATION CONSULT NOTE - Initial Consult  Pharmacy Consult for heparin Indication: pulmonary embolus  Allergies  Allergen Reactions  . Ativan [Lorazepam] Other (See Comments)    Confusion   . Pitavastatin Itching    Reaction to Livalo  . Ropinirole Nausea Only and Other (See Comments)    Makes legs jump, also  . Zofran [Ondansetron Hcl] Nausea Only  . Zolpidem Tartrate Anxiety and Other (See Comments)    CONFUSION   . Penicillins Rash and Other (See Comments)    Tolerated cephalosporins in past Has patient had a PCN reaction causing immediate rash, facial/tongue/throat swelling, SOB or lightheadedness with hypotension: Yes Has patient had a PCN reaction causing severe rash involving mucus membranes or skin necrosis: No Has patient had a PCN reaction that required hospitalization: No Has patient had a PCN reaction occurring within the last 10 years: No If all of the above answers are "NO", then may proceed with Cephalosporin use.     Patient Measurements: Height: 5\' 8"  (172.7 cm) Weight: 78 kg (172 lb) IBW/kg (Calculated) : 63.9 Heparin Dosing Weight: 78kg  Vital Signs: Temp: 98.2 F (36.8 C) (05/20 1938) Temp Source: Oral (05/20 1938) BP: 116/51 (05/20 1938) Pulse Rate: 71 (05/20 1938)  Labs: Recent Labs    05/10/21 1515 05/10/21 1541 05/10/21 1741  HGB 11.9*  --   --   HCT 39.4  --   --   PLT 248  --   --   CREATININE 1.11*  --   --   TROPONINIHS  --  212* 197*    Estimated Creatinine Clearance: 44.4 mL/min (A) (by C-G formula based on SCr of 1.11 mg/dL (H)).   Medical History: Past Medical History:  Diagnosis Date  . Anemia    bld. transfusion post lumbar surgery- 2012  . Anxiety   . Arthralgia    NOS  . Blood transfusion 2012; 02/2018   WITH BACK SURGERY; "related to hematoma in stomach" (10/06/2018)  . CAD (coronary artery disease)    s/p CABG 2004; s/p DES to LM in 2010;  [place]atter 10/29/11: EF 50-55%, mild elevated filling pressures, no pulmonary  HTN, LM 90% ISR, LAD and CFX occluded, S-RI occluded (old), S-OM3 ok and L-LAD ok, native nondominant RCA 95% -  med rx recommended ; Lexiscan Myoview 7/13 at [facility]: demonstrated "normal LV function, anterior attenuation and localized ischemia, inferior, basilar, mid section"  . Carotid artery disease ([place])    Carotid US 5/80:  RICA 9-98; LICA 33-82; R subclavian stenosis - Repeat 1 year. // Carotid US 05/2019: R 1-39; L 40-59; R vertebral with atypical antegrade flow; R subclavian stenosis >> repeat 1 year // Carotid US 7/21: Bilat 40-59; L subclavian stenosis   . CHF (congestive heart failure) ([place]) 01/21/2020  . Chronic diastolic heart failure (HCC)    Echo 9/10: EF 50-53%, grade 1 diastolic dysfunction  . Chronic lower back pain   . COPD (chronic obstructive pulmonary disease) ([place])    Emphysema dxed by Dr. Woody Seller in [place] based on PFTs per pt in 2006; placed on albuterol  . Depression    TAKES CELEXA  AND  (OFF- WELBUTRIN)  . Diuretic-induced hypokalemia 10/08/2018  . DVT of lower extremity (deep venous thrombosis) (HCC)    recurrent. bilateral (2 episodes)  . Dyspnea    home o2 when needed  . Dysrhythmia    afib with cabg  . Exertional angina (HCC)    Treated with Isosorbide, Ranexa, amlodipine; intolerant to metoprolol  . GERD (  gastroesophageal reflux disease)   . Gout    "on daily RX" (10/06/2018)  . HLD (hyperlipidemia)   . Hypertension   . Hyperthyroidism   . L Subclavian Artery Stenosis    Carotid US 7/21   . Obesity (BMI 30-39.9) 2009   BMI 33  . Osteoarthritis    "all over; hands" (10/06/2018)  . Oxygen deficiency   . Oxygen dependent    4L at home as needed (10/06/2018)  . Pneumonia   . PVD (peripheral vascular disease) ([place])   . Sleep apnea 2012   USED CPAP THEN  STARTED USING SPIRIVA , Nov. 2013- last evaluation , changed from mask to aparatus that is just for her nose.. ; reports 05-28-18 "the mask smothers me so i dont use it right now" (10/06/2018)    Assessment: 71 YOF presenting with SOB, hx of PE no longer on anticoagulation, concern for new PE.    Goal of Therapy:  Heparin level 0.3-0.7 units/ml Monitor platelets by anticoagulation protocol: Yes   Plan:  Heparin 4500 units IV x 1, and gtt at 1200 units/hr F/u 8 hour heparin level F/u CT angiogram and [facility] plan  Bertis Ruddy, PharmD Clinical Pharmacist ED Pharmacist Phone # [phone] 05/10/2021 7:56 PM

## 2021-05-10 NOTE — ED Provider Notes (Signed)
[facility] EMERGENCY DEPARTMENT Provider Note   CSN: [id number] Arrival date & time: 05/10/21  1235     History Chief Complaint  Patient presents with  . Shortness of Breath    [name] is a 80 y.o. female.  HPI      80 year old female with a history of congestive heart failure, coronary artery disease, GERD, angiodysplasia of intestine, chronic low back pain, gout, COPD, hyperlipidemia, hypertension, history of PE no longer on anticoagulation, hypothyroidism, peripheral artery disease, left subclavian artery stenosis, anemia, struct of sleep apnea, recent admission for acute on chronic hypoxic respiratory failure due to COPD and diastolic congestive heart failure with discharge May 14 (on 3-4L O2 baseline) who presents with shortness of breath.  More short of breath when getting up to walk.  Felt like it started getting worse when she went home on the 5/14. The nebulizer helped some but sig dyspnea with walking.  She was given solumedrol and had lasix increased as an outpatient.  Significant dyspnea on exertion.  No fever, no nausea, no vomiting.   Chest tightness more severe with ambulation but also present at rest.  Dyspnea with laying flat. Leg swelling worse than usual.   Past Medical History:  Diagnosis Date  . Anemia    bld. transfusion post lumbar surgery- 2012  . Anxiety   . Arthralgia    NOS  . Blood transfusion 2012; 02/2018   WITH BACK SURGERY; "related to hematoma in stomach" (10/06/2018)  . CAD (coronary artery disease)    s/p CABG 2004; s/p DES to LM in 2010;  [place] 10/29/11: EF 50-55%, mild elevated filling pressures, no pulmonary HTN, LM 90% ISR, LAD and CFX occluded, S-RI occluded (old), S-OM3 ok and L-LAD ok, native nondominant RCA 95% -  med rx recommended ; Lexiscan Myoview 7/13 at [facility]: demonstrated "normal LV function, anterior attenuation and localized ischemia, inferior, basilar, mid section"  . Carotid artery disease  ([place])    Carotid US [id number]:  RICA [id number]; LICA [id number]; R subclavian stenosis - Repeat 1 year. // Carotid US 05/2019: R 1-39; L 40-59; R vertebral with atypical antegrade flow; R subclavian stenosis >> repeat 1 year // Carotid US 7/21: Bilat 40-59; L subclavian stenosis   . CHF (congestive heart failure) ([place]) 01/21/2020  . Chronic diastolic heart failure (HCC)    Echo 9/10: EF [id number], grade 1 diastolic dysfunction  . Chronic lower back pain   . COPD (chronic obstructive pulmonary disease) ([place])    Emphysema dxed by Dr. Woody Seller in [place] based on PFTs per pt in 2006; placed on albuterol  . Depression    TAKES CELEXA  AND  (OFF- WELBUTRIN)  . Diuretic-induced hypokalemia 10/08/2018  . DVT of lower extremity (deep venous thrombosis) (HCC)    recurrent. bilateral (2 episodes)  . Dyspnea    home o2 when needed  . Dysrhythmia    afib with cabg  . Exertional angina (HCC)    Treated with Isosorbide, Ranexa, amlodipine; intolerant to metoprolol  . GERD (gastroesophageal reflux disease)   . Gout    "on daily RX" (10/06/2018)  . HLD (hyperlipidemia)   . Hypertension   . Hyperthyroidism   . L Subclavian Artery Stenosis    Carotid US 7/21   . Obesity (BMI 30-39.9) 2009   BMI 33  . Osteoarthritis    "all over; hands" (10/06/2018)  . Oxygen deficiency   . Oxygen dependent    4L at home as needed (10/06/2018)  .  Pneumonia   . PVD (peripheral vascular disease) ([place])   . Sleep apnea 2012   USED CPAP THEN  STARTED USING SPIRIVA , Nov. 2013- last evaluation , changed from mask to aparatus that is just for her nose.. ; reports 05-28-18 "the mask smothers me so i dont use it right now" (10/06/2018)    Patient Active Problem List   Diagnosis Date Noted  . Chronic pain 05/11/2021  . Chest pain 05/10/2021  . COPD exacerbation ([place]) 04/29/2021  . Prolonged QT interval 04/29/2021  . Depression 04/29/2021  . RLS (restless legs syndrome) 04/29/2021  . CAD (coronary artery disease) of bypass graft  02/07/2021  . CHF (congestive heart failure) ([place]) 02/06/2021  . CHF (congestive heart failure), NYHA class III, acute, diastolic ([place]) 85/63/1497  . Normocytic anemia 10/28/2020  . Acute respiratory failure with hypoxia ([place]) 10/09/2020  . Hypotension 10/09/2020  . CAP (community acquired pneumonia) 10/09/2020  . L Subclavian Artery Stenosis   . S/P hip replacement, left 02/07/2020  . History of pulmonary embolism 10/04/2019  . Dyspnea 12/24/2018  . Cellulitis 12/24/2018  . Otitis media 12/24/2018  . Diuretic-induced hypokalemia 10/08/2018  . Acute on chronic diastolic heart failure ([place]) 10/06/2018  . Hematuria 04/01/2018  . Acute lower UTI 04/01/2018  . Hematoma of abdominal wall, subsequent encounter 04/01/2018  . Chronic diastolic heart failure ([place]) 03/29/2018  . GERD without esophagitis 03/29/2018  . Chronic gout 03/29/2018  . Chronic bilateral low back pain without sciatica 03/29/2018  . Colitis 03/10/2018  . AKI (acute kidney injury) ([place])   . Ascites   . Pulmonary embolism ([place]) 03/01/2018  . 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[place]) 03/01/2018  . Acute on chronic respiratory failure with hypoxia ([place]) 03/01/2018  . Angiodysplasia of intestine   . Hematochezia   . Malnutrition of moderate degree 02/15/2017  . Blood loss anemia 02/13/2017  . Impingement syndrome of left shoulder 02/10/2013  . PAD (peripheral artery disease) ([place]) 11/17/2011  . Dyslipidemia 10/17/2011  . Coronary artery disease involving native coronary artery of native heart with angina pectoris ([place])   . Hypothyroidism 05/03/2008  . Essential hypertension 05/03/2008  . Sleep apnea 05/03/2008  . COPD (chronic obstructive pulmonary disease) ([place]) 04/12/2008    Past Surgical History:  Procedure Laterality Date  . ANKLE SURGERY  2004  . ANTERIOR CERVICAL DECOMP/DISCECTOMY FUSION  11/2007  . AUGMENTATION MAMMAPLASTY    . [place]   lower; another scheduled, opt. reports 4 back- lumbar, 3 cerv.  fusions  for later 2009  . CARDIAC CATHETERIZATION  11/2009   Patent LIMA to LAD and patent SVG to OM1. Occluded SVG to ramus and diagonal. Left main: 90% ostial stenosis, LCX 60-70% proximal stenosis  . CATARACT EXTRACTION W/ INTRAOCULAR LENS  IMPLANT, BILATERAL Bilateral 2006  . COLONOSCOPY WITH PROPOFOL N/A 02/17/2017   Procedure: COLONOSCOPY WITH PROPOFOL;  Surgeon: Jerene Bears, MD;  Location[facility] ENDOSCOPY;  Service: Endoscopy;  Laterality: N/A;  . CORONARY ANGIOPLASTY WITH STENT PLACEMENT  11/2009   Drug eluting stent to left main artery: 4.0 X 12 mm Ion   . CORONARY ARTERY BYPASS GRAFT  2004   "CABG X4"  . ESOPHAGOGASTRODUODENOSCOPY N/A 02/14/2017   Procedure: ESOPHAGOGASTRODUODENOSCOPY (EGD);  Surgeon: Doran Stabler, MD;  Location: [facility] ENDOSCOPY;  Service: Endoscopy;  Laterality: N/A;  . GIVENS CAPSULE STUDY N/A 02/15/2017   Procedure: GIVENS CAPSULE STUDY;  Surgeon: Doran Stabler, MD;  Location: [facility];  Service: Endoscopy;  Laterality: N/A;  .  GROIN MASS OPEN BIOPSY  2004  . IR IVC FILTER PLMT / S&I /IMG GUID/MOD SED  03/14/2018  . IR PARACENTESIS  03/10/2018  . JOINT REPLACEMENT    . LAPAROSCOPIC CHOLECYSTECTOMY    . PERIPHERAL VASCULAR INTERVENTION Left 03/05/2018   Procedure: PERIPHERAL VASCULAR INTERVENTION;  Surgeon: Elam Dutch, MD;  Location: [facility] CV LAB;  Service: Cardiovascular;  Laterality: Left;  Attempted unsuccess\ful Per Dr. Eden Lathe  . PERIPHERAL VASCULAR INTERVENTION  03/08/2018   Procedure: PERIPHERAL VASCULAR INTERVENTION;  Surgeon: Waynetta Sandy, MD;  Location: [facility] CV LAB;  Service: Cardiovascular;;  SMA Stent   . REPLACEMENT TOTAL KNEE BILATERAL Bilateral 2012  . REVERSE SHOULDER ARTHROPLASTY  02/26/2012   Procedure: REVERSE SHOULDER ARTHROPLASTY;  Surgeon: Marin Shutter, MD;  Location: [facility];  Service: Orthopedics;  Laterality: Right;  RIGHT SHOULDER REVERSED ARTHROPLASTY  . SHOULDER ARTHROSCOPY WITH SUBACROMIAL DECOMPRESSION  Left 02/10/2013   Procedure: SHOULDER ARTHROSCOPY WITH SUBACROMIAL DECOMPRESSION DISTAL CLAVICLE RESECTION;  Surgeon: Marin Shutter, MD;  Location: [facility];  Service: Orthopedics;  Laterality: Left;  DISTAL CLAVICLE RESECTION  . TONSILLECTOMY    . TOTAL HIP ARTHROPLASTY  2007   Right  . TOTAL HIP ARTHROPLASTY Left 02/07/2020   Procedure: TOTAL HIP ARTHROPLASTY ANTERIOR APPROACH;  Surgeon: Paralee Cancel, MD;  Location: [facility];  Service: Orthopedics;  Laterality: Left;  70 mins  . TRANSURETHRAL RESECTION OF BLADDER TUMOR N/A 05/31/2018   Procedure: TRANSURETHRAL RESECTION OF BLADDER TUMOR (TURBT);  Surgeon: Lucas Mallow, MD;  Location: [facility];  Service: Urology;  Laterality: N/A;  . VISCERAL ANGIOGRAPHY N/A 03/05/2018   Procedure: VISCERAL ANGIOGRAPHY;  Surgeon: Elam Dutch, MD;  Location: [facility] CV LAB;  Service: Cardiovascular;  Laterality: N/A;  . VISCERAL ANGIOGRAPHY N/A 03/08/2018   Procedure: VISCERAL ANGIOGRAPHY;  Surgeon: Waynetta Sandy, MD;  Location: [facility] CV LAB;  Service: Cardiovascular;  Laterality: N/A;     OB History   No obstetric history on file.     Family History  Problem Relation Age of Onset  . Ovarian cancer Mother   . Lung cancer Father   . COPD Father   . Heart disease Father   . Heart disease Sister   . Stomach cancer Sister   . Colitis Sister     Social History   Tobacco Use  . Smoking status: Former Smoker    Packs/day: 1.00    Years: 45.00    Pack years: 45.00    Types: Cigarettes    Quit date: 12/22/2000    Years since quitting: 20.3  . Smokeless tobacco: Never Used  Vaping Use  . Vaping Use: Never used  Substance Use Topics  . Alcohol use: Never    Alcohol/week: 0.0 standard drinks  . Drug use: Never    Home Medications Prior to Admission medications   Medication Sig Start Date End Date Taking? Authorizing Provider  allopurinol (ZYLOPRIM) 100 MG tablet Take 100 mg by mouth daily with breakfast. 01/30/17  Yes  [provider]  ARIPiprazole (ABILIFY) 2 MG tablet Take 2 mg by mouth daily with breakfast. 12/29/19  Yes [provider]  aspirin EC 81 MG tablet Take 81 mg by mouth daily with breakfast. Swallow whole.   Yes [provider]  atorvastatin (LIPITOR) 20 MG tablet Take 20 mg by mouth daily with supper. 09/21/20  Yes [provider]  cholecalciferol (VITAMIN D3) 25 MCG (1000 UNIT) tablet Take 1,000 Units by mouth daily with supper.  Yes [provider]  citalopram (CELEXA) 40 MG tablet Take 40 mg by mouth daily with supper. 08/30/19  Yes [provider]  cyclobenzaprine (FLEXERIL) 10 MG tablet Take 10 mg by mouth daily.   Yes [provider]  ferrous sulfate 325 (65 FE) MG tablet Take 325 mg by mouth daily with breakfast.   Yes [provider]  furosemide (LASIX) 40 MG tablet Take 120 mg by mouth daily. 80 mg in the morning 40 mg in the evening   Yes [provider]  gabapentin (NEURONTIN) 100 MG capsule Take 100 mg by mouth daily with breakfast. 01/09/20  Yes [provider]  Ipratropium-Albuterol (COMBIVENT RESPIMAT) 20-100 MCG/ACT AERS respimat Inhale 1 puff into the lungs 3 (three) times daily as needed for wheezing or shortness of breath.   Yes [provider]  levothyroxine (SYNTHROID) 50 MCG tablet Take 1 tablet (50 mcg total) by mouth daily at 6 (six) AM. 09/13/20  Yes Fay Records, MD  magnesium oxide (MAG-OX) 400 (241.3 Mg) MG tablet Take 1 tablet (400 mg total) by mouth 2 (two) times daily. 03/26/18  Yes Mikhail, Velta Addison, DO  Melatonin 10 MG TABS Take 30 mg by mouth at bedtime.   Yes [provider]  nitroGLYCERIN (NITROSTAT) 0.4 MG SL tablet Place 1 tablet (0.4 mg total) under the tongue every 5 (five) minutes as needed for chest pain. For chest pain 04/24/16  Yes Fay Records, MD  omeprazole (PRILOSEC) 40 MG capsule TAKE 1 CAPSULE BY MOUTH 2 TIMES DAILY. 12/20/20  Yes Esterwood, Amy S, PA-C   OXYGEN Inhale 4 L/min into the lungs as needed (DURING ALL TIMES OF EXERTION).    Yes [provider]  pilocarpine (PILOCAR) 1 % ophthalmic solution Place 1 drop into both eyes 2 (two) times daily.   Yes [provider]  potassium chloride SA (KLOR-CON) 20 MEQ tablet Take 20 mEq by mouth daily with lunch.   Yes [provider]  pramipexole (MIRAPEX) 0.25 MG tablet Take 0.25 mg by mouth daily with supper. 08/22/20  Yes [provider]  predniSONE (DELTASONE) 5 MG tablet Label  & dispense according to the schedule below. take 8 Pills PO for 3 days, 6 Pills PO for 3 days, 4 Pills PO for 3 days, 2 Pills PO for 3 days, 1 Pills PO for 3 days, 1/2 Pill  PO for 3 days then STOP. Total 65 pills. 05/04/21  Yes Thurnell Lose, MD  roflumilast (DALIRESP) 500 MCG TABS tablet Take 500 mcg by mouth daily with breakfast.   Yes [provider]  spironolactone (ALDACTONE) 25 MG tablet Take 0.5 tablets (12.5 mg total) by mouth daily. 02/12/21  Yes Katherine Roan, MD  traZODone (DESYREL) 50 MG tablet Take 150 mg by mouth at bedtime.  10/07/18  Yes [provider]  vitamin C (ASCORBIC ACID) 500 MG tablet Take 500 mg by mouth daily with lunch.   Yes [provider]  XTAMPZA ER 9 MG C12A Take 9 mg by mouth 2 (two) times daily. 09/25/20  Yes [provider]    Allergies    Ativan [lorazepam], Pitavastatin, Ropinirole, Zofran [ondansetron hcl], Zolpidem tartrate, and Penicillins  Review of Systems   Review of Systems  Constitutional: Negative for fever.  HENT: Negative for sore throat.   Eyes: Negative for visual disturbance.  Respiratory: Positive for cough (thick yellow mucus) and shortness of breath.   Cardiovascular: Negative for chest pain.  Gastrointestinal: Negative for abdominal pain,  nausea and vomiting.  Genitourinary: Negative for difficulty urinating.  Musculoskeletal: Negative for back pain and neck pain.  Skin: Negative for  rash.  Neurological: Negative for syncope and headaches.    Physical Exam Updated Vital Signs BP 140/63 (BP Location: Right Arm)   Pulse 87   Temp 98.3 F (36.8 C) (Oral)   Resp 18   Ht 5\' 8"  (1.727 m)   Wt 77.1 kg   SpO2 99%   BMI 25.84 kg/m   Physical Exam Vitals and nursing note reviewed.  Constitutional:      General: She is not in acute distress.    Appearance: She is well-developed. She is not diaphoretic.  HENT:     Head: Normocephalic and atraumatic.  Eyes:     Conjunctiva/sclera: Conjunctivae normal.  Cardiovascular:     Rate and Rhythm: Normal rate and regular rhythm.     Heart sounds: Normal heart sounds. No murmur heard. No friction rub. No gallop.   Pulmonary:     Effort: Pulmonary effort is normal. No respiratory distress.     Breath sounds: Normal breath sounds. No wheezing or rales.  Abdominal:     General: There is no distension.     Palpations: Abdomen is soft.     Tenderness: There is no abdominal tenderness. There is no guarding.  Musculoskeletal:        General: No tenderness.     Cervical back: Normal range of motion.  Skin:    General: Skin is warm and dry.     Findings: No erythema or rash.  Neurological:     Mental Status: She is alert and oriented to person, place, and time.     ED Results / Procedures / Treatments   Labs (all labs ordered are listed, but only abnormal results are displayed) Labs Reviewed  BASIC METABOLIC PANEL - Abnormal; Notable for the following components:      Result Value   Chloride 97 (*)    CO2 35 (*)    Glucose, Bld 108 (*)    BUN 30 (*)    Creatinine, Ser 1.11 (*)    Calcium 8.0 (*)    GFR, Estimated 50 (*)    All other components within normal limits  CBC WITH DIFFERENTIAL/PLATELET - Abnormal; Notable for the following components:   WBC 18.1 (*)    Hemoglobin 11.9 (*)    RDW 16.1 (*)    Neutro Abs 14.6 (*)    Monocytes Absolute 1.4 (*)    Abs Immature Granulocytes 0.71 (*)    All other  components within normal limits  BRAIN NATRIURETIC PEPTIDE - Abnormal; Notable for the following components:   B Natriuretic Peptide 471.5 (*)    All other components within normal limits  MAGNESIUM - Abnormal; Notable for the following components:   Magnesium 2.6 (*)    All other components within normal limits  D-DIMER, QUANTITATIVE - Abnormal; Notable for the following components:   D-Dimer, Quant 3.23 (*)    All other components within normal limits  CBC - Abnormal; Notable for the following components:   WBC 14.5 (*)    Hemoglobin 11.5 (*)    RDW 16.3 (*)    All other components within normal limits  TROPONIN I (HIGH SENSITIVITY) - Abnormal; Notable for the following components:   Troponin I (High Sensitivity) 212 (*)    All other components within normal limits  TROPONIN I (HIGH SENSITIVITY) - Abnormal; Notable for the following components:   Troponin I (High  Sensitivity) 197 (*)    All other components within normal limits  TROPONIN I (HIGH SENSITIVITY) - Abnormal; Notable for the following components:   Troponin I (High Sensitivity) 173 (*)    All other components within normal limits  TROPONIN I (HIGH SENSITIVITY) - Abnormal; Notable for the following components:   Troponin I (High Sensitivity) 193 (*)    All other components within normal limits  RESP PANEL BY RT-PCR (FLU A&B, COVID) ARPGX2  CULTURE, BLOOD (SINGLE)  LACTIC ACID, PLASMA  LACTIC ACID, PLASMA  HEPARIN LEVEL (UNFRACTIONATED)  LIPID PANEL  HEPARIN LEVEL (UNFRACTIONATED)  BASIC METABOLIC PANEL    EKG EKG Interpretation  Date/Time:  Saturday May 11 2021 02:34:01 EDT Ventricular Rate:  79 PR Interval:  191 QRS Duration: 110 QT Interval:  418 QTC Calculation: 480 R Axis:   -69 Text Interpretation: Sinus rhythm Left anterior fascicular block Probable left ventricular hypertrophy When compared with ECG of 05/10/2021, No significant change was found Confirmed by Delora Fuel [id number]) on 05/11/2021 4:59:28  AM   Radiology DG Chest 2 View  Result Date: 05/10/2021 CLINICAL DATA:  Chest pain and shortness of breath. Bilateral leg swelling. EXAM: CHEST - 2 VIEW COMPARISON:  Chest x-ray dated mid tenth 2022. FINDINGS: Stable heart size status post CABG. Normal pulmonary vascularity. The lungs remain mildly hyperinflated with chronic mild interstitial coarsening. No focal consolidation, pleural effusion, or pneumothorax. No acute osseous abnormality. IMPRESSION: 1.  No active cardiopulmonary disease. 2. COPD. Electronically Signed   By: Titus Dubin M.D.   On: 05/10/2021 16:14   CT Angio Chest PE W and/or Wo Contrast  Result Date: 05/10/2021 CLINICAL DATA:  Shortness of breath. Previous history of pulmonary embolism. EXAM: CT ANGIOGRAPHY CHEST WITH CONTRAST TECHNIQUE: Multidetector CT imaging of the chest was performed using the standard protocol during bolus administration of intravenous contrast. Multiplanar CT image reconstructions and MIPs were obtained to evaluate the vascular anatomy. CONTRAST:  25mL OMNIPAQUE IOHEXOL 350 MG/ML SOLN COMPARISON:  12/01/2008 FINDINGS: Cardiovascular: Satisfactory opacification of pulmonary arteries noted, and no pulmonary emboli identified. No evidence of thoracic aortic. Aortic and coronary atherosclerotic calcification noted. Stable mild cardiomegaly. Mediastinum/Nodes: No masses or pathologically enlarged lymph nodes identified. Lungs/Pleura: No pulmonary mass, infiltrate, or effusion. Stable biapical pleural-parenchymal scarring. Upper abdomen: No acute findings. Musculoskeletal: No suspicious bone lesions identified. Review of the MIP images confirms the above findings. IMPRESSION: No evidence of pulmonary embolism or other active disease. Aortic Atherosclerosis (ICD10-I70.0). Electronically Signed   By: Marlaine Hind M.D.   On: 05/10/2021 20:41    Procedures Procedures   Medications Ordered in ED Medications  heparin ADULT infusion 100 units/mL (25000  units/273mL) (1,200 Units/hr Intravenous Infusion Verify 05/11/21 0847)  nitroGLYCERIN (NITROSTAT) SL tablet 0.4 mg (has no administration in time range)  acetaminophen (TYLENOL) tablet 650 mg (has no administration in time range)  ipratropium-albuterol (DUONEB) 0.5-2.5 (3) MG/3ML nebulizer solution 3 mL (3 mLs Nebulization Given 05/11/21 0826)  furosemide (LASIX) injection 40 mg (40 mg Intravenous Given 05/11/21 0834)  allopurinol (ZYLOPRIM) tablet 100 mg (100 mg Oral Given 05/11/21 0833)  aspirin EC tablet 81 mg (81 mg Oral Given 05/11/21 0834)  oxyCODONE (OXYCONTIN) 12 hr tablet 10 mg (10 mg Oral Given 05/11/21 0915)  atorvastatin (LIPITOR) tablet 20 mg (has no administration in time range)  spironolactone (ALDACTONE) tablet 12.5 mg (12.5 mg Oral Given 05/11/21 0833)  traZODone (DESYREL) tablet 150 mg (150 mg Oral Given 05/11/21 0138)  levothyroxine (SYNTHROID) tablet 50 mcg (50 mcg Oral Given  05/11/21 0615)  ferrous sulfate tablet 325 mg (325 mg Oral Given 05/11/21 0833)  pantoprazole (PROTONIX) EC tablet 40 mg (40 mg Oral Given 05/11/21 0833)  melatonin tablet 10 mg (10 mg Oral Given 05/11/21 0130)  gabapentin (NEURONTIN) capsule 100 mg (100 mg Oral Given 05/11/21 0833)  pramipexole (MIRAPEX) tablet 0.25 mg (has no administration in time range)  roflumilast (DALIRESP) tablet 500 mcg (500 mcg Oral Given 05/11/21 0915)  ascorbic acid (VITAMIN C) tablet 500 mg (has no administration in time range)  potassium chloride SA (KLOR-CON) CR tablet 20 mEq (has no administration in time range)  magnesium oxide (MAG-OX) tablet 400 mg (400 mg Oral Given 05/11/21 0833)  cholecalciferol (VITAMIN D3) tablet 1,000 Units (has no administration in time range)  cyclobenzaprine (FLEXERIL) tablet 10 mg (10 mg Oral Given 05/11/21 0834)  aspirin chewable tablet 324 mg (324 mg Oral Given 05/10/21 1953)  heparin bolus via infusion 4,500 Units (4,500 Units Intravenous Bolus from Bag 05/10/21 2038)  iohexol (OMNIPAQUE) 350 MG/ML  injection 50 mL (50 mLs Intravenous Contrast Given 05/10/21 2019)  oxyCODONE (OXYCONTIN) 12 hr tablet 10 mg (10 mg Oral Given 05/11/21 0129)    ED Course  I have reviewed the triage vital signs and the nursing notes.  Pertinent labs & imaging results that were available during my care of the patient were reviewed by me and considered in my medical decision making (see chart for details).    MDM Rules/Calculators/A&P                          80 year old female with a history of congestive heart failure, coronary artery disease, GERD, angiodysplasia of intestine, chronic low back pain, gout, COPD, hyperlipidemia, hypertension, history of PE no longer on anticoagulation, hypothyroidism, peripheral artery disease, left subclavian artery stenosis, anemia, struct of sleep apnea, recent admission for acute on chronic hypoxic respiratory failure due to COPD and diastolic congestive heart failure with discharge May 14 (on 3-4L O2 baseline) who presents with shortness of breath.   Differential diagnosis for dyspnea includes ACS, PE, COPD exacerbation, CHF exacerbation, anemia, pneumonia, viral etiology such as COVID 19 infection, metabolic abnormality.  Chest x-ray was done which showed no acute abnormalities, no edema, no pneumothorax. EKG was evaluated by me which showed no significnat findings.  BNP was 471, decreased from recent hospitalization 5/11 when it was 963, increased from February 345.  Clinically given decreased BNP, clear CXR, do not feel CHF is primary problem..  Consider COPD but she does not appear to have acute respiratory distress--she is on steroids and abx as an outpatient and support continuing this but do not feel she requires additional IV steroid or nebulized treatments at this time. Leukocytosis likely secondary to recent steroid use and doubt pneumonia.  Given hx of PE off of anticoagulation, sent ddimer.   Troponin and ddimer both returned significantly elevated. Given concern  troponin elevation may be secondary to heart strain from PE, did order PE study to evaluate for PE and further evaluate the lungs and started heparin gtt.    CT returned showing no PE, Troponin persistently elevated but not increasing.  She continues to have chest tightness, consider unstable angina.  Cardiology consulted and medicine admitted.  Final Clinical Impression(s) / ED Diagnoses Final diagnoses:  Shortness of breath  Angina decubitus (HCC)  Troponin level elevated    Rx / DC Orders ED Discharge Orders    None       Alyze Lauf,  Junie Panning, MD 05/11/21 1011

## 2021-05-10 NOTE — ED Provider Notes (Signed)
Emergency Medicine Provider Triage Evaluation Note  [name] , a 80 y.o. female  was evaluated in triage.  Pt complains of not feeling well, recently admitted to the hospital for same, dc home 05/04/21. Denies fever, CP. Admitted for sepsis 04/29/21. Seen by PCP Wednesday and given Solumedrol and increased lasix, went to cards today, sent to ED for possible admission concern for further diuretics with poor renal function.  Review of Systems  Positive: SHOB Negative: CP, fever  Physical Exam  BP (!) 108/96 (BP Location: Left Arm)   Pulse 79   Temp 98 F (36.7 C)   Resp 16   SpO2 99%  Gen:   Awake, no distress   Resp:  Normal effort  MSK:   Moves extremities without difficulty  Other:    Medical Decision Making  Medically screening exam initiated at 3:09 PM.  Appropriate orders placed.  Emmalea Treanor was informed that the remainder of the evaluation will be completed by another provider, this initial triage assessment does not replace that evaluation, and the importance of remaining in the ED until their evaluation is complete.     Tacy Learn, PA-C 05/10/21 1514    Elnora Morrison, MD 05/10/21 [phone]

## 2021-05-10 NOTE — Patient Instructions (Signed)
Recommend report to ER for further evaluation and treatement of symptoms.  Dr. Harrington Challenger spoke with ER physician.  They are aware you are coming in.

## 2021-05-10 NOTE — H&P (Signed)
History and Physical    [name] [id number] DOB: February 22, 1941 DOA: 05/10/2021  PCP: [name], MD  Patient coming from: Home, son at bedside  I have personally briefly reviewed patient's old medical records in [place]  Chief Complaint: shortness of breath  HPI: [name] is a 80 y.o. female with medical history significant for CAD s/p CABG, hypertension, PAD, chronic diastolic heart failure, COPD with chronic hypoxemia on 4 L who presents with concerns of increasing shortness of breath.  She was recently hospitalized from 5/9-5/14 when she presented with shortness of breath and was diagnosed with acute on chronic hypoxemic respiratory failure secondary to COPD exacerbation and diastolic CHF.  She was treated with IV steroids and IV Lasix, Doxy, nebs and was discharged home on prednisone taper. She was discharged stable on 3L.  For the past 2 weeks, she has noticed increasing shortness of breath worse with exertion.  Also felt like she had to sleep with more pillows propped up.  Has been waking up feeling short of breath.  Has associated cough that is sometimes productive.  Also notes increasing lower extremity edema. Has constant mid-sternal chest ache that is worse with exertion.   About 2 days ago, her PCP increased her to 40 mg BID to 80 mg in the morning and 40 at night.  She continued to have great urine output and felt minimally better with increased diuretic regimen.  Also feels minimally improved with home nebulizer treatment.  Reports that she received a steroid injection in her hip 2 days ago that she says was for her breathing and not hip pain.  In the ED, Patient was afebrile and normotensive  on 3 L of O2 via nasal cannula.  Troponin of 212 downward trended to 197.  BNP of 471 which is actually improved from her last admission of 683. ED Dr. Billy Fischer discussed with cardiology fellow who recommended starting her on IV heparin drip since she  continues to have chest pain symptoms.  Review of Systems:  Constitutional: No Weight Change, No Fever ENT/Mouth: No sore throat, No Rhinorrhea Eyes: No Eye Pain, No Vision Changes Cardiovascular: + Chest Pain, +SOB, + PND, + Dyspnea on Exertion, + Orthopnea, No Claudication, + Edema, No Palpitations Respiratory: + Cough,+ Sputum, No Wheezing, no Dyspnea  Gastrointestinal: No Nausea, No Vomiting, No Diarrhea, No Constipation, No Pain Genitourinary: no Urinary Incontinence, No Urgency, No Flank Pain Musculoskeletal: No Arthralgias, No Myalgias Skin: No Skin Lesions, No Pruritus, Neuro: no Weakness, No Numbness,  Psych: No Anxiety/Panic, No Depression, no decrease appetite Heme/Lymph: No Bruising, No Bleeding  Past Medical History:  Diagnosis Date  . Anemia    bld. transfusion post lumbar surgery- 2012  . Anxiety   . Arthralgia    NOS  . Blood transfusion 2012; 02/2018   WITH BACK SURGERY; "related to hematoma in stomach" (10/06/2018)  . CAD (coronary artery disease)    s/p CABG 2004; s/p DES to LM in 2010;  [place] 10/29/11: EF 50-55%, mild elevated filling pressures, no pulmonary HTN, LM 90% ISR, LAD and CFX occluded, S-RI occluded (old), S-OM3 ok and L-LAD ok, native nondominant RCA 95% -  med rx recommended ; Lexiscan Myoview 7/13 at [facility]: demonstrated "normal LV function, anterior attenuation and localized ischemia, inferior, basilar, mid section"  . Carotid artery disease ([place])    Carotid US [id number]:  RICA [id number]; LICA [id number]; R subclavian stenosis - Repeat 1 year. // Carotid US 05/2019: R 1-39; L 40-59;  R vertebral with atypical antegrade flow; R subclavian stenosis >> repeat 1 year // Carotid US 7/21: Bilat 40-59; L subclavian stenosis   . CHF (congestive heart failure) ([place]) 01/21/2020  . Chronic diastolic heart failure (HCC)    Echo 9/10: EF 87-56%, grade 1 diastolic dysfunction  . Chronic lower back pain   . COPD (chronic obstructive pulmonary disease) ([place])    Emphysema dxed  by Dr. Woody Seller in [place] based on PFTs per pt in 2006; placed on albuterol  . Depression    TAKES CELEXA  AND  (OFF- WELBUTRIN)  . Diuretic-induced hypokalemia 10/08/2018  . DVT of lower extremity (deep venous thrombosis) (HCC)    recurrent. bilateral (2 episodes)  . Dyspnea    home o2 when needed  . Dysrhythmia    afib with cabg  . Exertional angina (HCC)    Treated with Isosorbide, Ranexa, amlodipine; intolerant to metoprolol  . GERD (gastroesophageal reflux disease)   . Gout    "on daily RX" (10/06/2018)  . HLD (hyperlipidemia)   . Hypertension   . Hyperthyroidism   . L Subclavian Artery Stenosis    Carotid US 7/21   . Obesity (BMI 30-39.9) 2009   BMI 33  . Osteoarthritis    "all over; hands" (10/06/2018)  . Oxygen deficiency   . Oxygen dependent    4L at home as needed (10/06/2018)  . Pneumonia   . PVD (peripheral vascular disease) ([place])   . Sleep apnea 2012   USED CPAP THEN  STARTED USING SPIRIVA , Nov. 2013- last evaluation , changed from mask to aparatus that is just for her nose.. ; reports 05-28-18 "the mask smothers me so i dont use it right now" (10/06/2018)    Past Surgical History:  Procedure Laterality Date  . ANKLE SURGERY  2004  . ANTERIOR CERVICAL DECOMP/DISCECTOMY FUSION  11/2007  . AUGMENTATION MAMMAPLASTY    . [place]   lower; another scheduled, opt. reports 4 back- lumbar, 3 cerv. fusions  for later 2009  . CARDIAC CATHETERIZATION  11/2009   Patent LIMA to LAD and patent SVG to OM1. Occluded SVG to ramus and diagonal. Left main: 90% ostial stenosis, LCX 60-70% proximal stenosis  . CATARACT EXTRACTION W/ INTRAOCULAR LENS  IMPLANT, BILATERAL Bilateral 2006  . COLONOSCOPY WITH PROPOFOL N/A 02/17/2017   Procedure: COLONOSCOPY WITH PROPOFOL;  Surgeon: Jerene Bears, MD;  Location: [facility] ENDOSCOPY;  Service: Endoscopy;  Laterality: N/A;  . CORONARY ANGIOPLASTY WITH STENT PLACEMENT  11/2009   Drug eluting stent to left main artery: 4.0 X 12 mm Ion   .  CORONARY ARTERY BYPASS GRAFT  2004   "CABG X4"  . ESOPHAGOGASTRODUODENOSCOPY N/A 02/14/2017   Procedure: ESOPHAGOGASTRODUODENOSCOPY (EGD);  Surgeon: Doran Stabler, MD;  Location: [facility] ENDOSCOPY;  Service: Endoscopy;  Laterality: N/A;  . GIVENS CAPSULE STUDY N/A 02/15/2017   Procedure: GIVENS CAPSULE STUDY;  Surgeon: Doran Stabler, MD;  Location: [place];  Service: Endoscopy;  Laterality: N/A;  . GROIN MASS OPEN BIOPSY  2004  . IR IVC FILTER PLMT / S&I /IMG GUID/MOD SED  03/14/2018  . IR PARACENTESIS  03/10/2018  . JOINT REPLACEMENT    . LAPAROSCOPIC CHOLECYSTECTOMY    . PERIPHERAL VASCULAR INTERVENTION Left 03/05/2018   Procedure: PERIPHERAL VASCULAR INTERVENTION;  Surgeon: Elam Dutch, MD;  Location: [place] CV LAB;  Service: Cardiovascular;  Laterality: Left;  Attempted unsuccess\ful Per Dr. Eden Lathe  . PERIPHERAL VASCULAR INTERVENTION  03/08/2018   Procedure:  PERIPHERAL VASCULAR INTERVENTION;  Surgeon: Waynetta Sandy, MD;  Location: [place] CV LAB;  Service: Cardiovascular;;  SMA Stent   . REPLACEMENT TOTAL KNEE BILATERAL Bilateral 2012  . REVERSE SHOULDER ARTHROPLASTY  02/26/2012   Procedure: REVERSE SHOULDER ARTHROPLASTY;  Surgeon: Marin Shutter, MD;  Location: [place];  Service: Orthopedics;  Laterality: Right;  RIGHT SHOULDER REVERSED ARTHROPLASTY  . SHOULDER ARTHROSCOPY WITH SUBACROMIAL DECOMPRESSION Left 02/10/2013   Procedure: SHOULDER ARTHROSCOPY WITH SUBACROMIAL DECOMPRESSION DISTAL CLAVICLE RESECTION;  Surgeon: Marin Shutter, MD;  Location: [place];  Service: Orthopedics;  Laterality: Left;  DISTAL CLAVICLE RESECTION  . TONSILLECTOMY    . TOTAL HIP ARTHROPLASTY  2007   Right  . TOTAL HIP ARTHROPLASTY Left 02/07/2020   Procedure: TOTAL HIP ARTHROPLASTY ANTERIOR APPROACH;  Surgeon: Paralee Cancel, MD;  Location: [facility];  Service: Orthopedics;  Laterality: Left;  70 mins  . TRANSURETHRAL RESECTION OF BLADDER TUMOR N/A 05/31/2018   Procedure: TRANSURETHRAL  RESECTION OF BLADDER TUMOR (TURBT);  Surgeon: Lucas Mallow, MD;  Location: [facility];  Service: Urology;  Laterality: N/A;  . VISCERAL ANGIOGRAPHY N/A 03/05/2018   Procedure: VISCERAL ANGIOGRAPHY;  Surgeon: Elam Dutch, MD;  Location: [place] CV LAB;  Service: Cardiovascular;  Laterality: N/A;  . VISCERAL ANGIOGRAPHY N/A 03/08/2018   Procedure: VISCERAL ANGIOGRAPHY;  Surgeon: Waynetta Sandy, MD;  Location: [place] CV LAB;  Service: Cardiovascular;  Laterality: N/A;     reports that she quit smoking about 20 years ago. Her smoking use included cigarettes. She has a 45.00 pack-year smoking history. She has never used smokeless tobacco. She reports that she does not drink alcohol and does not use drugs. Social History  Allergies  Allergen Reactions  . Ativan [Lorazepam] Other (See Comments)    Confusion   . Pitavastatin Itching    Reaction to Livalo  . Ropinirole Nausea Only and Other (See Comments)    Makes legs jump, also  . Zofran [Ondansetron Hcl] Nausea Only  . Zolpidem Tartrate Anxiety and Other (See Comments)    CONFUSION   . Penicillins Rash and Other (See Comments)    Tolerated cephalosporins in past Has patient had a PCN reaction causing immediate rash, facial/tongue/throat swelling, SOB or lightheadedness with hypotension: Yes Has patient had a PCN reaction causing severe rash involving mucus membranes or skin necrosis: No Has patient had a PCN reaction that required hospitalization: No Has patient had a PCN reaction occurring within the last 10 years: No If all of the above answers are "NO", then may proceed with Cephalosporin use.     Family History  Problem Relation Age of Onset  . Ovarian cancer Mother   . Lung cancer Father   . COPD Father   . Heart disease Father   . Heart disease Sister   . Stomach cancer Sister   . Colitis Sister      Prior to Admission medications   Medication Sig Start Date End Date Taking? Authorizing Provider   allopurinol (ZYLOPRIM) 100 MG tablet Take 100 mg by mouth daily with breakfast. 01/30/17  Yes [provider]  ARIPiprazole (ABILIFY) 2 MG tablet Take 2 mg by mouth daily with breakfast. 12/29/19  Yes [provider]  aspirin EC 81 MG tablet Take 81 mg by mouth daily with breakfast. Swallow whole.   Yes [provider]  atorvastatin (LIPITOR) 20 MG tablet Take 20 mg by mouth daily with supper. 09/21/20  Yes [provider]  cholecalciferol (VITAMIN D3) 25  MCG (1000 UNIT) tablet Take 1,000 Units by mouth daily with supper.   Yes [provider]  citalopram (CELEXA) 40 MG tablet Take 40 mg by mouth daily with supper. 08/30/19  Yes [provider]  cyclobenzaprine (FLEXERIL) 10 MG tablet Take 10 mg by mouth daily.   Yes [provider]  ferrous sulfate 325 (65 FE) MG tablet Take 325 mg by mouth daily with breakfast.   Yes [provider]  furosemide (LASIX) 40 MG tablet Take 120 mg by mouth daily. 80 mg in the morning 40 mg in the evening   Yes [provider]  gabapentin (NEURONTIN) 100 MG capsule Take 100 mg by mouth daily with breakfast. 01/09/20  Yes [provider]  Ipratropium-Albuterol (COMBIVENT RESPIMAT) 20-100 MCG/ACT AERS respimat Inhale 1 puff into the lungs 3 (three) times daily as needed for wheezing or shortness of breath.   Yes [provider]  levothyroxine (SYNTHROID) 50 MCG tablet Take 1 tablet (50 mcg total) by mouth daily at 6 (six) AM. 09/13/20  Yes Fay Records, MD  magnesium oxide (MAG-OX) 400 (241.3 Mg) MG tablet Take 1 tablet (400 mg total) by mouth 2 (two) times daily. 03/26/18  Yes Mikhail, Velta Addison, DO  Melatonin 10 MG TABS Take 30 mg by mouth at bedtime.   Yes [provider]  nitroGLYCERIN (NITROSTAT) 0.4 MG SL tablet Place 1 tablet (0.4 mg total) under the tongue every 5 (five) minutes as needed for chest pain. For chest pain 04/24/16  Yes Fay Records, MD  omeprazole  (PRILOSEC) 40 MG capsule TAKE 1 CAPSULE BY MOUTH 2 TIMES DAILY. 12/20/20  Yes Esterwood, Amy S, PA-C  OXYGEN Inhale 4 L/min into the lungs as needed (DURING ALL TIMES OF EXERTION).    Yes [provider]  pilocarpine (PILOCAR) 1 % ophthalmic solution Place 1 drop into both eyes 2 (two) times daily.   Yes [provider]  potassium chloride SA (KLOR-CON) 20 MEQ tablet Take 20 mEq by mouth daily with lunch.   Yes [provider]  pramipexole (MIRAPEX) 0.25 MG tablet Take 0.25 mg by mouth daily with supper. 08/22/20  Yes [provider]  predniSONE (DELTASONE) 5 MG tablet Label  & dispense according to the schedule below. take 8 Pills PO for 3 days, 6 Pills PO for 3 days, 4 Pills PO for 3 days, 2 Pills PO for 3 days, 1 Pills PO for 3 days, 1/2 Pill  PO for 3 days then STOP. Total 65 pills. 05/04/21  Yes Thurnell Lose, MD  roflumilast (DALIRESP) 500 MCG TABS tablet Take 500 mcg by mouth daily with breakfast.   Yes [provider]  spironolactone (ALDACTONE) 25 MG tablet Take 0.5 tablets (12.5 mg total) by mouth daily. 02/12/21  Yes Katherine Roan, MD  traZODone (DESYREL) 50 MG tablet Take 150 mg by mouth at bedtime.  10/07/18  Yes [provider]  vitamin C (ASCORBIC ACID) 500 MG tablet Take 500 mg by mouth daily with lunch.   Yes [provider]  XTAMPZA ER 9 MG C12A Take 9 mg by mouth 2 (two) times daily. 09/25/20  Yes [provider]    Physical Exam: Vitals:   05/10/21 1730 05/10/21 1830 05/10/21 1938 05/10/21 2259  BP: (!) 134/100 123/65 (!) 116/51 121/62  Pulse: 69 79 71 72  Resp: (!) 24 16 16 19   Temp:   98.2 F (36.8 C) 98.3 F (36.8 C)  TempSrc:   Oral Oral  SpO2: 100% 90% 98% 99%  Weight:      Height:        Constitutional: NAD, calm, comfortable, elderly chronically ill-appearing female sitting upright in bed Vitals:   05/10/21 1730 05/10/21 1830 05/10/21 1938 05/10/21 2259  BP: (!) 134/100 123/65 (!)  116/51 121/62  Pulse: 69 79 71 72  Resp: (!) 24 16 16 19   Temp:   98.2 F (36.8 C) 98.3 F (36.8 C)  TempSrc:   Oral Oral  SpO2: 100% 90% 98% 99%  Weight:      Height:       Eyes: PERRL, lids and conjunctivae normal ENMT: Mucous membranes are moist. Posterior pharynx clear of any exudate or lesions.Normal dentition.  Neck: normal, supple Respiratory: Diminished lung sounds throughout with rhonchi and faint expiratory wheezing on 3 L via nasal cannula, normal respiratory effort. No accessory muscle use.  Cardiovascular: Regular rate and rhythm, no murmurs / rubs / gallops.  +1 pitting edema bilateral lower extremity up to mid pretibial region.   Abdomen: no tenderness, no masses palpated.  Bowel sounds positive.  Musculoskeletal: no clubbing / cyanosis. No joint deformity upper and lower extremities. Good ROM, no contractures. Normal muscle tone.  Skin: no rashes, lesions, ulcers. No induration Neurologic: CN 2-12 grossly intact. Sensation intact, Strength 5/5 in all 4.  Psychiatric: Normal judgment and insight. Alert and oriented x 3. Normal mood.    Labs on Admission: I have personally reviewed following labs and imaging studies  CBC: Recent Labs  Lab 05/10/21 1515  WBC 18.1*  NEUTROABS 14.6*  HGB 11.9*  HCT 39.4  MCV 90.2  PLT [id number]   Basic Metabolic Panel: Recent Labs  Lab 05/10/21 1515 05/10/21 1541  NA 138  --   K 4.6  --   CL 97*  --   CO2 35*  --   GLUCOSE 108*  --   BUN 30*  --   CREATININE 1.11*  --   CALCIUM 8.0*  --   MG  --  2.6*   GFR: Estimated Creatinine Clearance: 44.4 mL/min (A) (by C-G formula based on SCr of 1.11 mg/dL (H)). Liver Function Tests: No results for input(s): AST, ALT, ALKPHOS, BILITOT, PROT, ALBUMIN in the last 168 hours. No results for input(s): LIPASE, AMYLASE in the last 168 hours. No results for input(s): AMMONIA in the last 168 hours. Coagulation Profile: No results for input(s): INR, PROTIME in the last 168 hours. Cardiac  Enzymes: No results for input(s): CKTOTAL, CKMB, CKMBINDEX, TROPONINI in the last 168 hours. BNP (last 3 results) Recent Labs    09/10/20 1535 02/21/21 1402 03/19/21 1321  PROBNP 745* 1,507* 1,355*   HbA1C: No results for input(s): HGBA1C in the last 72 hours. CBG: No results for input(s): GLUCAP in the last 168 hours. Lipid Profile: No results for input(s): CHOL, HDL, LDLCALC, TRIG, CHOLHDL, LDLDIRECT in the last 72 hours. Thyroid Function Tests: No results for input(s): TSH, T4TOTAL, FREET4, T3FREE, THYROIDAB in the last 72 hours. Anemia Panel: No results for input(s): VITAMINB12, FOLATE, FERRITIN, TIBC, IRON, RETICCTPCT in the last 72 hours. Urine analysis:    Component Value Date/Time   COLORURINE YELLOW 10/10/2020 1125   APPEARANCEUR CLEAR 10/10/2020 1125   LABSPEC 1.014 10/10/2020 1125   PHURINE 5.0 10/10/2020 1125   GLUCOSEU NEGATIVE 10/10/2020 1125   [place] 10/10/2020 1125   [place] 10/10/2020 1125   KETONESUR NEGATIVE 10/10/2020 1125   PROTEINUR NEGATIVE 10/10/2020 1125   UROBILINOGEN 1.0 03/30/2011 0901  NITRITE NEGATIVE 10/10/2020 1125   LEUKOCYTESUR NEGATIVE 10/10/2020 1125    Radiological Exams on Admission: DG Chest 2 View  Result Date: 05/10/2021 CLINICAL DATA:  Chest pain and shortness of breath. Bilateral leg swelling. EXAM: CHEST - 2 VIEW COMPARISON:  Chest x-ray dated mid tenth 2022. FINDINGS: Stable heart size status post CABG. Normal pulmonary vascularity. The lungs remain mildly hyperinflated with chronic mild interstitial coarsening. No focal consolidation, pleural effusion, or pneumothorax. No acute osseous abnormality. IMPRESSION: 1.  No active cardiopulmonary disease. 2. COPD. Electronically Signed   By: Titus Dubin M.D.   On: 05/10/2021 16:14   CT Angio Chest PE W and/or Wo Contrast  Result Date: 05/10/2021 CLINICAL DATA:  Shortness of breath. Previous history of pulmonary embolism. EXAM: CT ANGIOGRAPHY CHEST WITH  CONTRAST TECHNIQUE: Multidetector CT imaging of the chest was performed using the standard protocol during bolus administration of intravenous contrast. Multiplanar CT image reconstructions and MIPs were obtained to evaluate the vascular anatomy. CONTRAST:  53mL OMNIPAQUE IOHEXOL 350 MG/ML SOLN COMPARISON:  12/01/2008 FINDINGS: Cardiovascular: Satisfactory opacification of pulmonary arteries noted, and no pulmonary emboli identified. No evidence of thoracic aortic. Aortic and coronary atherosclerotic calcification noted. Stable mild cardiomegaly. Mediastinum/Nodes: No masses or pathologically enlarged lymph nodes identified. Lungs/Pleura: No pulmonary mass, infiltrate, or effusion. Stable biapical pleural-parenchymal scarring. Upper abdomen: No acute findings. Musculoskeletal: No suspicious bone lesions identified. Review of the MIP images confirms the above findings. IMPRESSION: No evidence of pulmonary embolism or other active disease. Aortic Atherosclerosis (ICD10-I70.0). Electronically Signed   By: Marlaine Hind M.D.   On: 05/10/2021 20:41      Assessment/Plan  Acute on chronic hypoxemic respiratory failure secondary COPD exacerbation - duoneb scheduled q6hr  - will continue her prednisone taper   Diastolic CHF exacerbation - pt endorses symptoms of orthopnea, PND and has finding of LE edema on exam but CT chest shows no pulmonary edema and BNP is improved from prior. Not entirely convinced of exacerbation but will trial increase IV Lasix and monitor UOP and symptoms - IV Lasix 40mg  BID  - Is and Os - Daily weights  - Last echo 09/2020 EF of 60-65%   Chest pain  -w/hx of CAD s/p CABG -continue heparin infusion given persistent chest pain and questionable CHF exac. - appreciate cardiology consult in the morning  Elevated d-dimer Unclear etiology - CTA chest negative for PE. -She has hx of DVT off anticoagulation. Check b/l DVT venous dopper since she has been having edema and unclear  whether if it is entirely related to her CHF  Prolonged QT-QTc of 490 - Hold SSRI and Abilify - Repeat EKG in the morning  Chronic pain - Continue home Xtampza ER  Hypothyroidism-continue home Synthroid  History of gout-continue home allopurinol  Restless leg-continue home pramipexole  Hyperlipidemia - on  Atorvastatin    DVT prophylaxis:.Heparin infusion Code Status: Full Family Communication: Plan discussed with patient and son at bedside  disposition Plan: Home with observation Consults called:  Admission status: Observation  Level of care: Telemetry Cardiac  Status is: Observation  The patient remains OBS appropriate and will d/c before 2 midnights.  Dispo: The patient is from: Home              Anticipated d/c is to: Home              Patient currently is not medically stable to d/c.   Difficult to place patient No         Royal Piedra T  Niquita Digioia DO [facility]   If 7PM-7AM, please contact night-coverage [email or web address]   05/10/2021, 11:14 PM

## 2021-05-10 NOTE — ED Notes (Signed)
Called lab, blue top was sent down earlier and a D-dimer will be run in lab.

## 2021-05-10 NOTE — ED Notes (Signed)
Patient transported to CT 

## 2021-05-10 NOTE — ED Triage Notes (Signed)
Pt c/o SOB. Pt was recently admited for the same complain, and was discharged yesterday. Pt is on 3l O2. A& O * 4

## 2021-05-11 ENCOUNTER — Encounter (HOSPITAL_COMMUNITY): Payer: Self-pay | Admitting: Family Medicine

## 2021-05-11 ENCOUNTER — Observation Stay (HOSPITAL_BASED_OUTPATIENT_CLINIC_OR_DEPARTMENT_OTHER): Payer: Medicare HMO

## 2021-05-11 DIAGNOSIS — E039 Hypothyroidism, unspecified: Secondary | ICD-10-CM | POA: Diagnosis not present

## 2021-05-11 DIAGNOSIS — G2581 Restless legs syndrome: Secondary | ICD-10-CM | POA: Diagnosis not present

## 2021-05-11 DIAGNOSIS — Z79890 Hormone replacement therapy (postmenopausal): Secondary | ICD-10-CM | POA: Diagnosis not present

## 2021-05-11 DIAGNOSIS — Z96611 Presence of right artificial shoulder joint: Secondary | ICD-10-CM | POA: Diagnosis present

## 2021-05-11 DIAGNOSIS — Z86711 Personal history of pulmonary embolism: Secondary | ICD-10-CM | POA: Diagnosis not present

## 2021-05-11 DIAGNOSIS — J441 Chronic obstructive pulmonary disease with (acute) exacerbation: Secondary | ICD-10-CM | POA: Diagnosis not present

## 2021-05-11 DIAGNOSIS — Z87891 Personal history of nicotine dependence: Secondary | ICD-10-CM | POA: Diagnosis not present

## 2021-05-11 DIAGNOSIS — I110 Hypertensive heart disease with heart failure: Secondary | ICD-10-CM | POA: Diagnosis not present

## 2021-05-11 DIAGNOSIS — R9431 Abnormal electrocardiogram [ECG] [EKG]: Secondary | ICD-10-CM | POA: Diagnosis present

## 2021-05-11 DIAGNOSIS — Z86718 Personal history of other venous thrombosis and embolism: Secondary | ICD-10-CM | POA: Diagnosis not present

## 2021-05-11 DIAGNOSIS — I2510 Atherosclerotic heart disease of native coronary artery without angina pectoris: Secondary | ICD-10-CM | POA: Diagnosis not present

## 2021-05-11 DIAGNOSIS — G8929 Other chronic pain: Secondary | ICD-10-CM

## 2021-05-11 DIAGNOSIS — Z9981 Dependence on supplemental oxygen: Secondary | ICD-10-CM | POA: Diagnosis not present

## 2021-05-11 DIAGNOSIS — Z96653 Presence of artificial knee joint, bilateral: Secondary | ICD-10-CM | POA: Diagnosis present

## 2021-05-11 DIAGNOSIS — Z9114 Patient's other noncompliance with medication regimen: Secondary | ICD-10-CM | POA: Diagnosis not present

## 2021-05-11 DIAGNOSIS — I5031 Acute diastolic (congestive) heart failure: Secondary | ICD-10-CM

## 2021-05-11 DIAGNOSIS — Z955 Presence of coronary angioplasty implant and graft: Secondary | ICD-10-CM | POA: Diagnosis not present

## 2021-05-11 DIAGNOSIS — R0602 Shortness of breath: Secondary | ICD-10-CM | POA: Diagnosis present

## 2021-05-11 DIAGNOSIS — I5033 Acute on chronic diastolic (congestive) heart failure: Secondary | ICD-10-CM | POA: Diagnosis not present

## 2021-05-11 DIAGNOSIS — I739 Peripheral vascular disease, unspecified: Secondary | ICD-10-CM | POA: Diagnosis present

## 2021-05-11 DIAGNOSIS — Z96642 Presence of left artificial hip joint: Secondary | ICD-10-CM | POA: Diagnosis present

## 2021-05-11 DIAGNOSIS — E785 Hyperlipidemia, unspecified: Secondary | ICD-10-CM | POA: Diagnosis not present

## 2021-05-11 DIAGNOSIS — M7989 Other specified soft tissue disorders: Secondary | ICD-10-CM

## 2021-05-11 DIAGNOSIS — Z20822 Contact with and (suspected) exposure to covid-19: Secondary | ICD-10-CM | POA: Diagnosis not present

## 2021-05-11 DIAGNOSIS — R079 Chest pain, unspecified: Secondary | ICD-10-CM | POA: Diagnosis not present

## 2021-05-11 DIAGNOSIS — I208 Other forms of angina pectoris: Secondary | ICD-10-CM | POA: Diagnosis not present

## 2021-05-11 DIAGNOSIS — J9621 Acute and chronic respiratory failure with hypoxia: Secondary | ICD-10-CM | POA: Diagnosis not present

## 2021-05-11 DIAGNOSIS — I5032 Chronic diastolic (congestive) heart failure: Secondary | ICD-10-CM

## 2021-05-11 DIAGNOSIS — Z9111 Patient's noncompliance with dietary regimen: Secondary | ICD-10-CM | POA: Diagnosis not present

## 2021-05-11 DIAGNOSIS — I509 Heart failure, unspecified: Secondary | ICD-10-CM

## 2021-05-11 DIAGNOSIS — M1A9XX0 Chronic gout, unspecified, without tophus (tophi): Secondary | ICD-10-CM | POA: Diagnosis not present

## 2021-05-11 LAB — HEPARIN LEVEL (UNFRACTIONATED)
Heparin Unfractionated: 0.57 IU/mL (ref 0.30–0.70)
Heparin Unfractionated: 0.24 IU/mL — ABNORMAL LOW (ref 0.30–0.70)
Heparin Unfractionated: 0.82 IU/mL — ABNORMAL HIGH (ref 0.30–0.70)

## 2021-05-11 LAB — BASIC METABOLIC PANEL
Anion gap: 5 (ref 5–15)
BUN: 23 mg/dL (ref 8–23)
CO2: 37 mmol/L — ABNORMAL HIGH (ref 22–32)
Calcium: 7.9 mg/dL — ABNORMAL LOW (ref 8.9–10.3)
Chloride: 97 mmol/L — ABNORMAL LOW (ref 98–111)
Creatinine, Ser: 0.95 mg/dL (ref 0.44–1.00)
GFR, Estimated: >60 mL/min (ref >60)
Glucose, Bld: 94 mg/dL (ref 70–99)
Potassium: 4.2 mmol/L (ref 3.5–5.1)
Sodium: 139 mmol/L (ref 135–145)

## 2021-05-11 LAB — CBC
HCT: 38.2 % (ref 36.0–46.0)
Hemoglobin: 11.5 g/dL — ABNORMAL LOW (ref 12.0–15.0)
MCH: 27.2 pg (ref 26.0–34.0)
MCHC: 30.1 g/dL (ref 30.0–36.0)
MCV: 90.3 fL (ref 80.0–100.0)
Platelets: 240 10*3/uL (ref 150–400)
RBC: 4.23 MIL/uL (ref 3.87–5.11)
RDW: 16.3 % — ABNORMAL HIGH (ref 11.5–15.5)
WBC: 14.5 10*3/uL — ABNORMAL HIGH (ref 4.0–10.5)
nRBC: 0 % (ref 0.0–0.2)

## 2021-05-11 LAB — LIPID PANEL
Cholesterol: 81 mg/dL (ref 0–200)
HDL: 47 mg/dL (ref >40)
LDL Cholesterol: 30 mg/dL (ref 0–99)
Total CHOL/HDL Ratio: 1.7 RATIO
Triglycerides: 22 mg/dL (ref <150)
VLDL: 4 mg/dL (ref 0–40)

## 2021-05-11 LAB — TROPONIN I (HIGH SENSITIVITY)
Troponin I (High Sensitivity): 173 ng/L (ref <18)
Troponin I (High Sensitivity): 193 ng/L (ref <18)

## 2021-05-11 NOTE — Progress Notes (Signed)
PROGRESS NOTE    [name]  XLK:[id number] DOB: [birth year] DOA: 05/10/2021 PCP: [name], MD   Brief Narrative:  [name] is a 80 y.o. female with medical history significant for CAD s/p CABG, hypertension, PAD, chronic diastolic heart failure, COPD with chronic hypoxemia on 4 L who presents with concerns of increasing shortness of breath. She was recently hospitalized from 5/9-5/14 when she presented with shortness of breath and was diagnosed with acute on chronic hypoxemic respiratory failure secondary to COPD exacerbation and diastolic CHF.  She was treated with IV steroids and IV Lasix, Doxy, nebs and was discharged home on prednisone taper. She was discharged stable on 3L. For the past 2 weeks, she has noticed increasing shortness of breath worse with exertion.  Also felt like she had to sleep with more pillows propped up.  Has been waking up feeling short of breath.  Has associated cough that is sometimes productive.  Also notes increasing lower extremity edema. Has constant mid-sternal chest ache that is worse with exertion.   About 2 days ago, her PCP increased her to 40 mg BID to 80 mg in the morning and 40 at night.  She continued to have great urine output and felt minimally better with increased diuretic regimen.  Also feels minimally improved with home nebulizer treatment.  Reports that she received a steroid injection in her hip 2 days ago that she says was for her breathing and not hip pain. Dr. Billy Fischer discussed with cardiology fellow who recommended starting her on IV heparin drip since she continues to have chest pain symptoms even at rest.  Assessment & Plan:   Active Problems:   Hypothyroidism   Dyslipidemia   Acute on chronic respiratory failure with hypoxia (HCC)   Chronic gout   COPD exacerbation (HCC)   Prolonged QT interval   RLS (restless legs syndrome)   Chest pain   Chronic pain   Acute on chronic hypoxemic respiratory failure  secondary COPD exacerbation - duoneb scheduled q6hr  - will continue her prednisone taper from previous admission  Diastolic CHF exacerbation, last EF 60-65% - pt endorses symptoms of orthopnea with elevated BNP and minimally elevated troponin - Cardiology following - appreciate insight/recs - IV Lasix 40mg  BID   Chest pain, unlikely ACS - Known history of CAD s/p CABG - Cardiology following - heparin infusion per their request, unlikely ACS given flat troponin and EKG - Discussion of further outpatient stress once acute COPD/hypoxic event has resolved  Elevated d-dimer - Unclear etiology - CTA chest negative for PE. - She has hx of DVT off anticoagulation with reported history of GI bleed in the past per family at bedside. - DVT prelim negative  Prolonged QT-QTc of 480-490 - Hold SSRI and Abilify - Repeat EKG in the morning stable  Chronic pain - Continue home Xtampza ER Hypothyroidism-continue home Synthroid History of gout-continue home allopurinol Restless leg-continue home pramipexole Hyperlipidemia - on  Atorvastatin   DVT prophylaxis: Heparin gtt per cards - likely transition off in the next 24-48h Code Status: Full Family Communication: None present  Status is: Inpt  Dispo: The patient is from: Home              Anticipated d/c is to: Home              Anticipated d/c date is: 24-48h              Patient currently NOT medically stable for discharge  Consultants:  Cardiology  Procedures:   None  Antimicrobials:  None indicated   Subjective: No acute issues/events overnight - already improving with steroids/lasix - not yet back to baseline though.  Objective: Vitals:   05/10/21 2259 05/11/21 0145 05/11/21 0404 05/11/21 0530  BP: 121/62 127/66 138/65 133/60  Pulse: 72 76 78 90  Resp: 19 (!) 21 20 (!) 22  Temp: 98.3 F (36.8 C)   99 F (37.2 C)  TempSrc: Oral   Oral  SpO2: 99% 93% 95% 99%  Weight:    77.1 kg  Height:    5\' 8"  (1.727 m)     Intake/Output Summary (Last 24 hours) at 05/11/2021 0717 Last data filed at 05/11/2021 0557 Gross per 24 hour  Intake 240 ml  Output --  Net 240 ml   Filed Weights   05/10/21 1510 05/11/21 0530  Weight: 78 kg 77.1 kg    Examination:  General:  Pleasantly resting in bed, No acute distress. HEENT:  Normocephalic atraumatic.  Sclerae nonicteric, noninjected.  Extraocular movements intact bilaterally. Neck:  Without mass or deformity.  Trachea is midline. Lungs:  Clear to auscultate bilaterally without rhonchi, wheeze, or rales. Heart:  Regular rate and rhythm.  Without murmurs, rubs, or gallops. Abdomen:  Soft, nontender, nondistended.  Without guarding or rebound. Extremities: Without cyanosis, clubbing, scant bipedal edema. Vascular:  Dorsalis pedis and posterior tibial pulses palpable bilaterally. Skin:  Warm and dry, no erythema, no ulcerations.   Data Reviewed: I have personally reviewed following labs and imaging studies  CBC: Recent Labs  Lab 05/10/21 1515 05/11/21 0224  WBC 18.1* 14.5*  NEUTROABS 14.6*  --   HGB 11.9* 11.5*  HCT 39.4 38.2  MCV 90.2 90.3  PLT 248 323   Basic Metabolic Panel: Recent Labs  Lab 05/10/21 1515 05/10/21 1541  NA 138  --   K 4.6  --   CL 97*  --   CO2 35*  --   GLUCOSE 108*  --   BUN 30*  --   CREATININE 1.11*  --   CALCIUM 8.0*  --   MG  --  2.6*   GFR: Estimated Creatinine Clearance: 44.2 mL/min (A) (by C-G formula based on SCr of 1.11 mg/dL (H)). Liver Function Tests: No results for input(s): AST, ALT, ALKPHOS, BILITOT, PROT, ALBUMIN in the last 168 hours. No results for input(s): LIPASE, AMYLASE in the last 168 hours. No results for input(s): AMMONIA in the last 168 hours. Coagulation Profile: No results for input(s): INR, PROTIME in the last 168 hours. Cardiac Enzymes: No results for input(s): CKTOTAL, CKMB, CKMBINDEX, TROPONINI in the last 168 hours. BNP (last 3 results) Recent Labs    09/10/20 1535  02/21/21 1402 03/19/21 1321  PROBNP 745* 1,507* 1,355*   HbA1C: No results for input(s): HGBA1C in the last 72 hours. CBG: No results for input(s): GLUCAP in the last 168 hours. Lipid Profile: Recent Labs    05/10/21 2305  CHOL 81  HDL 47  LDLCALC 30  TRIG 22  CHOLHDL 1.7   Thyroid Function Tests: No results for input(s): TSH, T4TOTAL, FREET4, T3FREE, THYROIDAB in the last 72 hours. Anemia Panel: No results for input(s): VITAMINB12, FOLATE, FERRITIN, TIBC, IRON, RETICCTPCT in the last 72 hours. Sepsis Labs: Recent Labs  Lab 05/10/21 1515 05/10/21 1715  LATICACIDVEN 1.3 1.7    Recent Results (from the past 240 hour(s))  Resp Panel by RT-PCR (Flu A&B, Covid) Nasopharyngeal Swab     Status: None   Collection Time: 05/10/21  4:06 PM   Specimen: Nasopharyngeal Swab; Nasopharyngeal(NP) swabs in vial transport medium  Result Value Ref Range Status   SARS Coronavirus 2 by RT PCR NEGATIVE NEGATIVE Final    Comment: (NOTE) SARS-CoV-2 target nucleic acids are NOT DETECTED.  The SARS-CoV-2 RNA is generally detectable in upper respiratory specimens during the acute phase of infection. The lowest concentration of SARS-CoV-2 viral copies this assay can detect is 138 copies/mL. A negative result does not preclude SARS-Cov-2 infection and should not be used as the sole basis for treatment or other patient management decisions. A negative result may occur with  improper specimen collection/handling, submission of specimen other than nasopharyngeal swab, presence of viral mutation(s) within the areas targeted by this assay, and inadequate number of viral copies(<138 copies/mL). A negative result must be combined with clinical observations, patient history, and epidemiological information. The expected result is Negative.  Fact Sheet for Patients:  EntrepreneurPulse.com.au  Fact Sheet for Healthcare Providers:  IncredibleEmployment.be  This  test is no t yet approved or cleared by the [place] FDA and  has been authorized for detection and/or diagnosis of SARS-CoV-2 by FDA under an Emergency Use Authorization (EUA). This EUA will remain  in effect (meaning this test can be used) for the duration of the COVID-19 declaration under Section 564(b)(1) of the Act, 21 U.S.C.section 360bbb-3(b)(1), unless the authorization is terminated  or revoked sooner.       Influenza A by PCR NEGATIVE NEGATIVE Final   Influenza B by PCR NEGATIVE NEGATIVE Final    Comment: (NOTE) The Xpert Xpress SARS-CoV-2/FLU/RSV plus assay is intended as an aid in the diagnosis of influenza from Nasopharyngeal swab specimens and should not be used as a sole basis for treatment. Nasal washings and aspirates are unacceptable for Xpert Xpress SARS-CoV-2/FLU/RSV testing.  Fact Sheet for Patients: EntrepreneurPulse.com.au  Fact Sheet for Healthcare Providers: IncredibleEmployment.be  This test is not yet approved or cleared by the [place] FDA and has been authorized for detection and/or diagnosis of SARS-CoV-2 by FDA under an Emergency Use Authorization (EUA). This EUA will remain in effect (meaning this test can be used) for the duration of the COVID-19 declaration under Section 564(b)(1) of the Act, 21 U.S.C. section 360bbb-3(b)(1), unless the authorization is terminated or revoked.  Performed at [facility], [place] [address] [place], [place] [address]          Radiology Studies: DG Chest 2 View  Result Date: 05/10/2021 CLINICAL DATA:  Chest pain and shortness of breath. Bilateral leg swelling. EXAM: CHEST - 2 VIEW COMPARISON:  Chest x-ray dated mid tenth 2022. FINDINGS: Stable heart size status post CABG. Normal pulmonary vascularity. The lungs remain mildly hyperinflated with chronic mild interstitial coarsening. No focal consolidation, pleural effusion, or pneumothorax. No acute osseous  abnormality. IMPRESSION: 1.  No active cardiopulmonary disease. 2. COPD. Electronically Signed   By: Titus Dubin M.D.   On: 05/10/2021 16:14   CT Angio Chest PE W and/or Wo Contrast  Result Date: 05/10/2021 CLINICAL DATA:  Shortness of breath. Previous history of pulmonary embolism. EXAM: CT ANGIOGRAPHY CHEST WITH CONTRAST TECHNIQUE: Multidetector CT imaging of the chest was performed using the standard protocol during bolus administration of intravenous contrast. Multiplanar CT image reconstructions and MIPs were obtained to evaluate the vascular anatomy. CONTRAST:  29mL OMNIPAQUE IOHEXOL 350 MG/ML SOLN COMPARISON:  12/01/2008 FINDINGS: Cardiovascular: Satisfactory opacification of pulmonary arteries noted, and no pulmonary emboli identified. No evidence of thoracic aortic. Aortic and coronary atherosclerotic calcification noted.  Stable mild cardiomegaly. Mediastinum/Nodes: No masses or pathologically enlarged lymph nodes identified. Lungs/Pleura: No pulmonary mass, infiltrate, or effusion. Stable biapical pleural-parenchymal scarring. Upper abdomen: No acute findings. Musculoskeletal: No suspicious bone lesions identified. Review of the MIP images confirms the above findings. IMPRESSION: No evidence of pulmonary embolism or other active disease. Aortic Atherosclerosis (ICD10-I70.0). Electronically Signed   By: Marlaine Hind M.D.   On: 05/10/2021 20:41      Scheduled Meds: . allopurinol  100 mg Oral Q breakfast  . vitamin C  500 mg Oral Q lunch  . aspirin EC  81 mg Oral Q breakfast  . atorvastatin  20 mg Oral Q supper  . cholecalciferol  1,000 Units Oral Q supper  . cyclobenzaprine  10 mg Oral Daily  . ferrous sulfate  325 mg Oral Q breakfast  . furosemide  40 mg Intravenous BID  . gabapentin  100 mg Oral Q breakfast  . ipratropium-albuterol  3 mL Nebulization Q6H  . levothyroxine  50 mcg Oral Q0600  . magnesium oxide  400 mg Oral BID  . melatonin  10 mg Oral QHS  . oxyCODONE  10 mg Oral  BID  . pantoprazole  40 mg Oral Daily  . potassium chloride SA  20 mEq Oral Q lunch  . pramipexole  0.25 mg Oral Q supper  . roflumilast  500 mcg Oral Q breakfast  . spironolactone  12.5 mg Oral Daily  . traZODone  150 mg Oral QHS   Continuous Infusions: . heparin 1,200 Units/hr (05/10/21 2037)     LOS: 0 days    Time spent: 85min  Khari Mally C Garl Speigner, DO [facility]  If 7PM-7AM, please contact night-coverage [email or web address]  05/11/2021, 7:17 AM

## 2021-05-11 NOTE — Progress Notes (Signed)
VASCULAR LAB    Bilateral lower extremity venous duplex has been performed.  See CV proc for preliminary results.   Brance Dartt, RVT 05/11/2021, 10:09 AM

## 2021-05-11 NOTE — Consult Note (Signed)
Cardiology Consultation:   Patient ID: [name] MRN: [id number]; DOB: [birth year]  Admit date: 05/10/2021 Date of Consult: 05/11/2021  PCP:  [name], [facility] Providers Cardiologist:  Judy Carnes, MD        Patient Profile:  [name] is a 80 y.o. female with a hx of CAD  (s/p CABG 2004; DES to LM in 2010; myovue in 2018: Low risk with distal anterior and apical ischemia), chronic diastolic CHF, CV dz, PAD recurrent DVT/PE, GI bleeding on anticoagulation COPD, PAF (after CABG), GERD, HL, HTN, renal insufficiency, sleep apnea (not on CPAP)  who is being seen 05/11/2021 for the evaluation of CHF  at the request of Dr. Avon [name].   History of Present Illness:   [name] with above hx and recent admit for diastolic exacerbation of CHF 5/9/2 and COPD exacerbation, treated with steroids, IV lasix doxycycline and nebulizers.  D/c'd 05/04/21 on 02 3 L.   She was seen in office yesterday by [name] with SOB and not feeling well, continued with lower ext edema.  She had been given IM solu-medrol on Wed.  Lasix had been increased as well to 80 in AM and 40 pm from 40 mg daily also on spironolactone.  The increase of lasix did not seem to help so [name] discussed with her PCP and they both felt she should return to ER.  [name] felt she should have IV diuresis.   With her CAD last intervention 2010 and myoview normal in 2018.   Cath in 2010 with PCI to LM ;patent LIMA occl of SVG to branches patent SVG to OM  Pt admitted for acute CHF.  She was afebrile, continued on 3 L 02,   She does tell me she uses NTG about 3 times per week and overall her angina has increased over last 6 months.  NTG does relieve the pain.  She feels her SOB is worse with the pain.     BNP 471,  HS Troponin 212 ; 197;173; 193 ( no troponins in March or early May, in Nov troponins were 17 and last oct were 47 to 41) Na 138, K+ 4.6 BUN 30, Cr 1.11  Lactic acid 1.3 and today 1.7  WBC  18.1 Hgb 11.9 COVID neg  LDL 30  DDimer 3.23   EKG:  The EKG was personally reviewed and demonstrates:  SR with LAD incomplete LBBB and today similar with no acute ST changes QTc 490 ms yesterday and 480 ntodauy Telemetry:  Telemetry was personally reviewed and demonstrates:  SR  2 V CXR  FINDINGS: Stable heart size status post CABG. Normal pulmonary vascularity. The lungs remain mildly hyperinflated with chronic mild interstitial coarsening. No focal consolidation, pleural effusion, or pneumothorax. No acute osseous abnormality.  IMPRESSION: 1.  No active cardiopulmonary disease. 2. COPD.  CTA chest 05/10/21 IMPRESSION: No evidence of pulmonary embolism or other active disease. Aortic Atherosclerosis  Aortic and coronary atherosclerotic calcification noted  Neg 371 since admit with 40 mg IV lasix BID first dose today, non yesterday.  ( dry wt of 75.8 in Feb at discharge 05/03/21 was 73 Kg.)    Past Medical History:  Diagnosis Date  . Anemia    bld. transfusion post lumbar surgery- 2012  . Anxiety   . Arthralgia    NOS  . Blood transfusion 2012; 02/2018   WITH BACK SURGERY; "related to hematoma in stomach" (10/06/2018)  . CAD (coronary artery disease)  s/p CABG 2004; s/p DES to LM in 2010;  [place] 10/29/11: EF 50-55%, mild elevated filling pressures, no pulmonary HTN, LM 90% ISR, LAD and CFX occluded, S-RI occluded (old), S-OM3 ok and L-LAD ok, native nondominant RCA 95% -  med rx recommended ; Lexiscan Myoview 7/13 at [facility]: demonstrated "normal LV function, anterior attenuation and localized ischemia, inferior, basilar, mid section"  . Carotid artery disease ([place])    Carotid US [id number]:  RICA [id number]; LICA [id number]; R subclavian stenosis - Repeat 1 year. // Carotid US 05/2019: R 1-39; L 40-59; R vertebral with atypical antegrade flow; R subclavian stenosis >> repeat 1 year // Carotid US 7/21: Bilat 40-59; L subclavian stenosis   . CHF (congestive heart failure) ([place]) 01/21/2020   . Chronic diastolic heart failure (HCC)    Echo 9/10: EF [id number], grade 1 diastolic dysfunction  . Chronic lower back pain   . COPD (chronic obstructive pulmonary disease) ([place])    Emphysema dxed by Dr. Woody Seller in [place] based on PFTs per pt in 2006; placed on albuterol  . Depression    TAKES CELEXA  AND  (OFF- WELBUTRIN)  . Diuretic-induced hypokalemia 10/08/2018  . DVT of lower extremity (deep venous thrombosis) (HCC)    recurrent. bilateral (2 episodes)  . Dyspnea    home o2 when needed  . Dysrhythmia    afib with cabg  . Exertional angina (HCC)    Treated with Isosorbide, Ranexa, amlodipine; intolerant to metoprolol  . GERD (gastroesophageal reflux disease)   . Gout    "on daily RX" (10/06/2018)  . HLD (hyperlipidemia)   . Hypertension   . Hyperthyroidism   . L Subclavian Artery Stenosis    Carotid US 7/21   . Obesity (BMI 30-39.9) 2009   BMI 33  . Osteoarthritis    "all over; hands" (10/06/2018)  . Oxygen deficiency   . Oxygen dependent    4L at home as needed (10/06/2018)  . Pneumonia   . PVD (peripheral vascular disease) ([place])   . Sleep apnea 2012   USED CPAP THEN  STARTED USING SPIRIVA , Nov. 2013- last evaluation , changed from mask to aparatus that is just for her nose.. ; reports 05-28-18 "the mask smothers me so i dont use it right now" (10/06/2018)    Past Surgical History:  Procedure Laterality Date  . ANKLE SURGERY  2004  . ANTERIOR CERVICAL DECOMP/DISCECTOMY FUSION  11/2007  . AUGMENTATION MAMMAPLASTY    . [place]   lower; another scheduled, opt. reports 4 back- lumbar, 3 cerv. fusions  for later 2009  . CARDIAC CATHETERIZATION  11/2009   Patent LIMA to LAD and patent SVG to OM1. Occluded SVG to ramus and diagonal. Left main: 90% ostial stenosis, LCX 60-70% proximal stenosis  . CATARACT EXTRACTION W/ INTRAOCULAR LENS  IMPLANT, BILATERAL Bilateral 2006  . COLONOSCOPY WITH PROPOFOL N/A 02/17/2017   Procedure: COLONOSCOPY WITH PROPOFOL;  Surgeon:  Judy Bears, MD;  Location: [facility] ENDOSCOPY;  Service: Endoscopy;  Laterality: N/A;  . CORONARY ANGIOPLASTY WITH STENT PLACEMENT  11/2009   Drug eluting stent to left main artery: 4.0 X 12 mm Ion   . CORONARY ARTERY BYPASS GRAFT  2004   "CABG X4"  . ESOPHAGOGASTRODUODENOSCOPY N/A 02/14/2017   Procedure: ESOPHAGOGASTRODUODENOSCOPY (EGD);  Surgeon: Doran Stabler, MD;  Location[facility] ENDOSCOPY;  Service: Endoscopy;  Laterality: N/A;  . GIVENS CAPSULE STUDY N/A 02/15/2017   Procedure: GIVENS CAPSULE STUDY;  Surgeon: Nelida Meuse III,  MD;  Location: [place] ENDOSCOPY;  Service: Endoscopy;  Laterality: N/A;  . GROIN MASS OPEN BIOPSY  2004  . IR IVC FILTER PLMT / S&I /IMG GUID/MOD SED  03/14/2018  . IR PARACENTESIS  03/10/2018  . JOINT REPLACEMENT    . LAPAROSCOPIC CHOLECYSTECTOMY    . PERIPHERAL VASCULAR INTERVENTION Left 03/05/2018   Procedure: PERIPHERAL VASCULAR INTERVENTION;  Surgeon: Elam Dutch, MD;  Location: [place] CV LAB;  Service: Cardiovascular;  Laterality: Left;  Attempted unsuccess\ful Per Dr. Eden Lathe  . PERIPHERAL VASCULAR INTERVENTION  03/08/2018   Procedure: PERIPHERAL VASCULAR INTERVENTION;  Surgeon: Waynetta Sandy, MD;  Location: [place] CV LAB;  Service: Cardiovascular;;  SMA Stent   . REPLACEMENT TOTAL KNEE BILATERAL Bilateral 2012  . REVERSE SHOULDER ARTHROPLASTY  02/26/2012   Procedure: REVERSE SHOULDER ARTHROPLASTY;  Surgeon: Marin Shutter, MD;  Location: [place];  Service: Orthopedics;  Laterality: Right;  RIGHT SHOULDER REVERSED ARTHROPLASTY  . SHOULDER ARTHROSCOPY WITH SUBACROMIAL DECOMPRESSION Left 02/10/2013   Procedure: SHOULDER ARTHROSCOPY WITH SUBACROMIAL DECOMPRESSION DISTAL CLAVICLE RESECTION;  Surgeon: Marin Shutter, MD;  Location: [place];  Service: Orthopedics;  Laterality: Left;  DISTAL CLAVICLE RESECTION  . TONSILLECTOMY    . TOTAL HIP ARTHROPLASTY  2007   Right  . TOTAL HIP ARTHROPLASTY Left 02/07/2020   Procedure: TOTAL HIP ARTHROPLASTY ANTERIOR  APPROACH;  Surgeon: Paralee Cancel, MD;  Location: [facility];  Service: Orthopedics;  Laterality: Left;  70 mins  . TRANSURETHRAL RESECTION OF BLADDER TUMOR N/A 05/31/2018   Procedure: TRANSURETHRAL RESECTION OF BLADDER TUMOR (TURBT);  Surgeon: Lucas Mallow, MD;  Location: [facility];  Service: Urology;  Laterality: N/A;  . VISCERAL ANGIOGRAPHY N/A 03/05/2018   Procedure: VISCERAL ANGIOGRAPHY;  Surgeon: Elam Dutch, MD;  Location: [place] CV LAB;  Service: Cardiovascular;  Laterality: N/A;  . VISCERAL ANGIOGRAPHY N/A 03/08/2018   Procedure: VISCERAL ANGIOGRAPHY;  Surgeon: Waynetta Sandy, MD;  Location: [place] CV [facility];  Service: Cardiovascular;  Laterality: N/A;     Home Medications:  Prior to Admission medications   Medication Sig Start Date End Date Taking? Authorizing Provider  allopurinol (ZYLOPRIM) 100 MG tablet Take 100 mg by mouth daily with breakfast. 01/30/17  Yes [provider]  ARIPiprazole (ABILIFY) 2 MG tablet Take 2 mg by mouth daily with breakfast. 12/29/19  Yes [provider]  aspirin EC 81 MG tablet Take 81 mg by mouth daily with breakfast. Swallow whole.   Yes [provider]  atorvastatin (LIPITOR) 20 MG tablet Take 20 mg by mouth daily with supper. 09/21/20  Yes [provider]  cholecalciferol (VITAMIN D3) 25 MCG (1000 UNIT) tablet Take 1,000 Units by mouth daily with supper.   Yes [provider]  citalopram (CELEXA) 40 MG tablet Take 40 mg by mouth daily with supper. 08/30/19  Yes [provider]  cyclobenzaprine (FLEXERIL) 10 MG tablet Take 10 mg by mouth daily.   Yes [provider]  ferrous sulfate 325 (65 FE) MG tablet Take 325 mg by mouth daily with breakfast.   Yes [provider]  furosemide (LASIX) 40 MG tablet Take 120 mg by mouth daily. 80 mg in the morning 40 mg in the evening   Yes [provider]  gabapentin (NEURONTIN) 100 MG capsule Take 100 mg by mouth daily with  breakfast. 01/09/20  Yes [provider]  Ipratropium-Albuterol (COMBIVENT RESPIMAT) 20-100 MCG/ACT AERS respimat Inhale 1 puff into the lungs 3 (three) times daily as needed for  wheezing or shortness of breath.   Yes [provider]  levothyroxine (SYNTHROID) 50 MCG tablet Take 1 tablet (50 mcg total) by mouth daily at 6 (six) AM. 09/13/20  Yes Fay Records, MD  magnesium oxide (MAG-OX) 400 (241.3 Mg) MG tablet Take 1 tablet (400 mg total) by mouth 2 (two) times daily. 03/26/18  Yes Mikhail, Velta Addison, DO  Melatonin 10 MG TABS Take 30 mg by mouth at bedtime.   Yes [provider]  nitroGLYCERIN (NITROSTAT) 0.4 MG SL tablet Place 1 tablet (0.4 mg total) under the tongue every 5 (five) minutes as needed for chest pain. For chest pain 04/24/16  Yes Fay Records, MD  omeprazole (PRILOSEC) 40 MG capsule TAKE 1 CAPSULE BY MOUTH 2 TIMES DAILY. 12/20/20  Yes Esterwood, Amy S, PA-C  OXYGEN Inhale 4 L/min into the lungs as needed (DURING ALL TIMES OF EXERTION).    Yes [provider]  pilocarpine (PILOCAR) 1 % ophthalmic solution Place 1 drop into both eyes 2 (two) times daily.   Yes [provider]  potassium chloride SA (KLOR-CON) 20 MEQ tablet Take 20 mEq by mouth daily with lunch.   Yes [provider]  pramipexole (MIRAPEX) 0.25 MG tablet Take 0.25 mg by mouth daily with supper. 08/22/20  Yes [provider]  predniSONE (DELTASONE) 5 MG tablet Label  & dispense according to the schedule below. take 8 Pills PO for 3 days, 6 Pills PO for 3 days, 4 Pills PO for 3 days, 2 Pills PO for 3 days, 1 Pills PO for 3 days, 1/2 Pill  PO for 3 days then STOP. Total 65 pills. 05/04/21  Yes Thurnell Lose, MD  roflumilast (DALIRESP) 500 MCG TABS tablet Take 500 mcg by mouth daily with breakfast.   Yes [provider]  spironolactone (ALDACTONE) 25 MG tablet Take 0.5 tablets (12.5 mg total) by mouth daily. 02/12/21  Yes Katherine Roan, MD  traZODone  (DESYREL) 50 MG tablet Take 150 mg by mouth at bedtime.  10/07/18  Yes [provider]  vitamin C (ASCORBIC ACID) 500 MG tablet Take 500 mg by mouth daily with lunch.   Yes [provider]  XTAMPZA ER 9 MG C12A Take 9 mg by mouth 2 (two) times daily. 09/25/20  Yes [provider]    Inpatient Medications: Scheduled Meds: . allopurinol  100 mg Oral Q breakfast  . vitamin C  500 mg Oral Q lunch  . aspirin EC  81 mg Oral Q breakfast  . atorvastatin  20 mg Oral Q supper  . cholecalciferol  1,000 Units Oral Q supper  . cyclobenzaprine  10 mg Oral Daily  . ferrous sulfate  325 mg Oral Q breakfast  . furosemide  40 mg Intravenous BID  . gabapentin  100 mg Oral Q breakfast  . ipratropium-albuterol  3 mL Nebulization Q6H  . levothyroxine  50 mcg Oral Q0600  . magnesium oxide  400 mg Oral BID  . melatonin  10 mg Oral QHS  . oxyCODONE  10 mg Oral BID  . pantoprazole  40 mg Oral Daily  . potassium chloride SA  20 mEq Oral Q lunch  . pramipexole  0.25 mg Oral Q supper  . roflumilast  500 mcg Oral Q breakfast  . spironolactone  12.5 mg Oral Daily  . traZODone  150 mg Oral QHS   Continuous Infusions: . heparin 1,200 Units/hr (05/11/21 0847)   PRN Meds: acetaminophen, nitroGLYCERIN  Allergies:  Allergies  Allergen Reactions  . Ativan [Lorazepam] Other (See Comments)    Confusion   . Pitavastatin Itching    Reaction to Livalo  . Ropinirole Nausea Only and Other (See Comments)    Makes legs jump, also  . Zofran [Ondansetron Hcl] Nausea Only  . Zolpidem Tartrate Anxiety and Other (See Comments)    CONFUSION   . Penicillins Rash and Other (See Comments)    Tolerated cephalosporins in past Has patient had a PCN reaction causing immediate rash, facial/tongue/throat swelling, SOB or lightheadedness with hypotension: Yes Has patient had a PCN reaction causing severe rash involving mucus membranes or skin necrosis: No Has patient had a PCN reaction that required  hospitalization: No Has patient had a PCN reaction occurring within the last 10 years: No If all of the above answers are "NO", then may proceed with Cephalosporin use.     Social History:   Social History   Socioeconomic History  . Marital status: Widowed    Spouse name: Not on file  . Number of children: 3  . Years of education: Not on file  . Highest education level: Not on file  Occupational History  . Occupation: retired    Comment: Electronics engineer: RETIRED  Tobacco Use  . Smoking status: Former Smoker    Packs/day: 1.00    Years: 45.00    Pack years: 45.00    Types: Cigarettes    Quit date: 12/22/2000    Years since quitting: 20.3  . Smokeless tobacco: Never Used  Vaping Use  . Vaping Use: Never used  Substance and Sexual Activity  . Alcohol use: Never    Alcohol/week: 0.0 standard drinks  . Drug use: Never  . Sexual activity: Not Currently  Other Topics Concern  . Not on file  Social History Narrative      3 grown children.   Social Determinants of Health   Financial Resource Strain: Not on file  Food Insecurity: No Food Insecurity  . Worried About Charity fundraiser in the Last Year: Never true  . Ran Out of Food in the Last Year: Never true  Transportation Needs: No Transportation Needs  . Lack of Transportation (Medical): No  . Lack of Transportation (Non-Medical): No  Physical Activity: Not on file  Stress: Not on file  Social Connections: Not on file  Intimate Partner Violence: Not on file    Family History:    Family History  Problem Relation Age of Onset  . Ovarian cancer Mother   . Lung cancer Father   . COPD Father   . Heart disease Father   . Heart disease Sister   . Stomach cancer Sister   . Colitis Sister      ROS:  Please see the history of present illness.  General:no colds or fevers, no weight changes Skin:no rashes or ulcers HEENT:no blurred vision, no congestion CV:see HPI PUL:see HPI GI:no diarrhea  constipation or melena, no indigestion GU:no hematuria, no dysuria MS:no joint pain, no claudication Neuro:no syncope, no lightheadedness Endo:no diabetes, + thyroid disease All other ROS reviewed and negative.     Physical Exam/Data:   Vitals:   05/11/21 0530 05/11/21 0804 05/11/21 0826 05/11/21 1200  BP: 133/60 140/63  (!) 119/52  Pulse: 90 87  79  Resp: (!) 22 18  17   Temp: 99 F (37.2 C) 98.3 F (36.8 C)  98.5 F (36.9 C)  TempSrc: Oral Oral  Oral  SpO2: 99% 100% 99%  99%  Weight: 77.1 kg     Height: 5\' 8"  (1.727 m)       Intake/Output Summary (Last 24 hours) at 05/11/2021 1243 Last data filed at 05/11/2021 1205 Gross per 24 hour  Intake 628.13 ml  Output 1000 ml  Net -371.87 ml   Last 3 Weights 05/11/2021 05/10/2021 05/10/2021  Weight (lbs) 169 lb 15.6 oz 172 lb 172 lb 6.4 oz  Weight (kg) 77.1 kg 78.019 kg 78.2 kg     Body mass index is 25.84 kg/m.  General:  Well nourished, well developed, in no acute distresseating lunch HEENT: normal Lymph: no adenopathy Neck: no JVD sitting upright Endocrine:  No thryomegaly Vascular: No carotid bruits; pedal pulses ?+ bilaterally lower ext dark in color  Cardiac:  normal S1, S2; RRR; no murmur gallup rub or click Lungs:  diminished throughtout to auscultation bilaterally, + wheezing, no rhonchi or rales  Abd: soft, nontender, no hepatomegaly  Ext: no edema Musculoskeletal:  No deformities, BUE and BLE strength normal and equal Skin: warm and dry  Neuro:  Alert and oriented X 3 MAE follows commands, no focal abnormalities noted Psych:  Normal affect     Relevant CV Studies: Echo 09/2020  IMPRESSIONS    1. Diastology suggestive of impaired LV filling. Left ventricular  ejection fraction, by estimation, is 60 to 65%. The left ventricle has  normal function. The left ventricle has no regional wall motion  abnormalities. There is moderate concentric left  ventricular hypertrophy. Left ventricular diastolic parameters are   indeterminate.  2. Right ventricular systolic function is low normal. The right  ventricular size is normal. There is normal pulmonary artery systolic  pressure.  3. Left atrial size was moderately dilated.  4. Right atrial size was moderately dilated.  5. The mitral valve is myxomatous. No evidence of mitral valve  regurgitation. The mean mitral valve gradient is 5.0 mmHg with average  heart rate of 87 bpm. Moderate mitral annular calcification.  6. The aortic valve is tricuspid. Aortic valve regurgitation is not  visualized.  7. The inferior vena cava is dilated in size with <50% respiratory  variability, suggesting right atrial pressure of 15 mmHg.   Comparison(s): A prior study was performed on 10/05/2019. No significant  change from prior study. Subtle decrease in RV function.   FINDINGS  Left Ventricle: Diastology suggestive of impaired LV filling. Left  ventricular ejection fraction, by estimation, is 60 to 65%. The left  ventricle has normal function. The left ventricle has no regional wall  motion abnormalities. The left ventricular  internal cavity size was normal in size. There is moderate concentric left  ventricular hypertrophy. Left ventricular diastolic parameters are  indeterminate.   Right Ventricle: The right ventricular size is normal. Right vetricular  wall thickness was not well visualized. Right ventricular systolic  function is low normal. There is normal pulmonary artery systolic  pressure. The tricuspid regurgitant velocity is  2.30 m/s, and with an assumed right atrial pressure of 3 mmHg, the  estimated right ventricular systolic pressure is Q000111Q mmHg.   Left Atrium: Left atrial size was moderately dilated.   Right Atrium: Right atrial size was moderately dilated.   Pericardium: There is no evidence of pericardial effusion.   Mitral Valve: The mitral valve is myxomatous. There is mild calcification  of the mitral valve leaflet(s). Moderate  mitral annular calcification. No  evidence of mitral valve regurgitation. MV peak gradient, 25.4 mmHg. The  mean mitral valve gradient is 5.0  mmHg with average heart rate of 87 bpm.   Tricuspid Valve: The tricuspid valve is grossly normal. Tricuspid valve  regurgitation is trivial.   Aortic Valve: The aortic valve is tricuspid. Aortic valve regurgitation is  not visualized.   Pulmonic Valve: The pulmonic valve was not well visualized. Pulmonic valve  regurgitation is trivial.   Laboratory Data:  High Sensitivity Troponin:   Recent Labs  Lab 05/10/21 1541 05/10/21 1741 05/10/21 2305 05/11/21 0033  TROPONINIHS 212* 197* 173* 193*     Chemistry Recent Labs  Lab 05/10/21 1515  NA 138  K 4.6  CL 97*  CO2 35*  GLUCOSE 108*  BUN 30*  CREATININE 1.11*  CALCIUM 8.0*  GFRNONAA 50*  ANIONGAP 6    No results for input(s): PROT, ALBUMIN, AST, ALT, ALKPHOS, BILITOT in the last 168 hours. Hematology Recent Labs  Lab 05/10/21 1515 05/11/21 0224  WBC 18.1* 14.5*  RBC 4.37 4.23  HGB 11.9* 11.5*  HCT 39.4 38.2  MCV 90.2 90.3  MCH 27.2 27.2  MCHC 30.2 30.1  RDW 16.1* 16.3*  PLT 248 240   BNP Recent Labs  Lab 05/10/21 1515  BNP 471.5*    DDimer  Recent Labs  Lab 05/10/21 1741  DDIMER 3.23*     Radiology/Studies:  DG Chest 2 View  Result Date: 05/10/2021 CLINICAL DATA:  Chest pain and shortness of breath. Bilateral leg swelling. EXAM: CHEST - 2 VIEW COMPARISON:  Chest x-ray dated mid tenth 2022. FINDINGS: Stable heart size status post CABG. Normal pulmonary vascularity. The lungs remain mildly hyperinflated with chronic mild interstitial coarsening. No focal consolidation, pleural effusion, or pneumothorax. No acute osseous abnormality. IMPRESSION: 1.  No active cardiopulmonary disease. 2. COPD. Electronically Signed   By: Titus Dubin M.D.   On: 05/10/2021 16:14   CT Angio Chest PE W and/or Wo Contrast  Result Date: 05/10/2021 CLINICAL DATA:  Shortness of  breath. Previous history of pulmonary embolism. EXAM: CT ANGIOGRAPHY CHEST WITH CONTRAST TECHNIQUE: Multidetector CT imaging of the chest was performed using the standard protocol during bolus administration of intravenous contrast. Multiplanar CT image reconstructions and MIPs were obtained to evaluate the vascular anatomy. CONTRAST:  70mL OMNIPAQUE IOHEXOL 350 MG/ML SOLN COMPARISON:  12/01/2008 FINDINGS: Cardiovascular: Satisfactory opacification of pulmonary arteries noted, and no pulmonary emboli identified. No evidence of thoracic aortic. Aortic and coronary atherosclerotic calcification noted. Stable mild cardiomegaly. Mediastinum/Nodes: No masses or pathologically enlarged lymph nodes identified. Lungs/Pleura: No pulmonary mass, infiltrate, or effusion. Stable biapical pleural-parenchymal scarring. Upper abdomen: No acute findings. Musculoskeletal: No suspicious bone lesions identified. Review of the MIP images confirms the above findings. IMPRESSION: No evidence of pulmonary embolism or other active disease. Aortic Atherosclerosis (ICD10-I70.0). Electronically Signed   By: Marlaine Hind M.D.   On: 05/10/2021 20:41   VAS [place] LOWER EXTREMITY VENOUS (DVT)  Result Date: 05/11/2021  Lower Venous DVT Study Patient Name:  PHENYX MATSUSHITA  Date of Exam:   05/11/2021 Medical Rec #: [id number]             Accession #:    [id number] Date of Birth: August 31, 1941              Patient Gender: F Patient Age:   79Y Exam Location:  [facility] Procedure:      VAS [place] LOWER EXTREMITY VENOUS (DVT) Referring Phys: [id number] [place] T TU --------------------------------------------------------------------------------  Indications: Swelling, and SOB.  Comparison Study: Prior study from 10/10/20 is available for comparison Performing Technologist: Sharion Dove RVS  Examination Guidelines: A complete evaluation includes B-mode imaging, spectral Doppler, color Doppler, and power Doppler as needed of all accessible portions of  each vessel. Bilateral testing is considered an integral part of a complete examination. Limited examinations for reoccurring indications may be performed as noted. The reflux portion of the exam is performed with the patient in reverse Trendelenburg.  +---------+---------------+---------+-----------+----------+--------------+ RIGHT    CompressibilityPhasicitySpontaneityPropertiesThrombus Aging +---------+---------------+---------+-----------+----------+--------------+ CFV      Full           Yes      Yes                                 +---------+---------------+---------+-----------+----------+--------------+ SFJ      Full                                                        +---------+---------------+---------+-----------+----------+--------------+ FV Prox  Full                                                        +---------+---------------+---------+-----------+----------+--------------+ FV Mid   Full                                                        +---------+---------------+---------+-----------+----------+--------------+ FV DistalFull                                                        +---------+---------------+---------+-----------+----------+--------------+ PFV      Full                                                        +---------+---------------+---------+-----------+----------+--------------+ POP      Full           Yes      Yes                                 +---------+---------------+---------+-----------+----------+--------------+ PTV      Full                                                        +---------+---------------+---------+-----------+----------+--------------+ PERO     Full                                                        +---------+---------------+---------+-----------+----------+--------------+   +---------+---------------+---------+-----------+----------+--------------+  LEFT      CompressibilityPhasicitySpontaneityPropertiesThrombus Aging +---------+---------------+---------+-----------+----------+--------------+ CFV      Full           Yes      Yes                                 +---------+---------------+---------+-----------+----------+--------------+ SFJ      Full                                                        +---------+---------------+---------+-----------+----------+--------------+ FV Prox  Full                                                        +---------+---------------+---------+-----------+----------+--------------+ FV Mid   Full                                                        +---------+---------------+---------+-----------+----------+--------------+ FV DistalFull                                                        +---------+---------------+---------+-----------+----------+--------------+ PFV      Full                                                        +---------+---------------+---------+-----------+----------+--------------+ POP      Full           Yes      Yes                                 +---------+---------------+---------+-----------+----------+--------------+ PTV      Full                                                        +---------+---------------+---------+-----------+----------+--------------+ PERO     Full                                                        +---------+---------------+---------+-----------+----------+--------------+     Summary: BILATERAL: - No evidence of deep vein thrombosis seen in the lower extremities, bilaterally. - RIGHT: - No cystic structure found in the popliteal fossa.  LEFT: - No cystic structure found in the popliteal fossa.  *See table(s) above for measurements  and observations.    Preliminary      Assessment and Plan:   1. Acute diastolic CHF and COPD exacerbation with elevated WBC may be due to steroids - no PNA seen on CTA  of chest and no PE.  Wt is about her baseline and today no lower ext edema.  She is on lasix 40 mg IV BID currently. COPD is large component on chrnic 02 at home 2.  elevated troponin, in 212 this is more elevated with other exacerbations.  She does have chest pain about 3 times per week and relief with NTG, overall more episodes of angina than 6 months ago.  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  Last cath 2010.  Neg nuc 2018.  Defer to Dr. Debara Pickett but possibly add add NTG vs nuc study to eval.   3. COPD exacerbation may need pulmonary consult with so many admit.  Per IM 4. Prolonged Qtc today 480 ms  Her SSRI and abilify were stopped.  5. HLD on statin.    Risk Assessment/Risk Scores:     HEAR Score (for undifferentiated chest pain):  HEAR Score: 6    + CHF class IV       For questions or updates, please contact [place] Please consult [email or web address] for contact info under    Signed, Cecilie Kicks, NP  05/11/2021 12:43 PM

## 2021-05-11 NOTE — Progress Notes (Signed)
[facility] for heparin Indication: chest pain/ACS  Allergies  Allergen Reactions  . Ativan [Lorazepam] Other (See Comments)    Confusion   . Pitavastatin Itching    Reaction to Livalo  . Ropinirole Nausea Only and Other (See Comments)    Makes legs jump, also  . Zofran [Ondansetron Hcl] Nausea Only  . Zolpidem Tartrate Anxiety and Other (See Comments)    CONFUSION   . Penicillins Rash and Other (See Comments)    Tolerated cephalosporins in past Has patient had a PCN reaction causing immediate rash, facial/tongue/throat swelling, SOB or lightheadedness with hypotension: Yes Has patient had a PCN reaction causing severe rash involving mucus membranes or skin necrosis: No Has patient had a PCN reaction that required hospitalization: No Has patient had a PCN reaction occurring within the last 10 years: No If all of the above answers are "NO", then may proceed with Cephalosporin use.     Patient Measurements: Height: 5\' 8"  (172.7 cm) Weight: 77.1 kg (169 lb 15.6 oz) IBW/kg (Calculated) : 63.9 Heparin Dosing Weight: 78kg  Vital Signs: Temp: 98.5 F (36.9 C) (05/21 1200) Temp Source: Oral (05/21 1200) BP: 119/52 (05/21 1200) Pulse Rate: 79 (05/21 1200)  Labs: Recent Labs    05/10/21 1515 05/10/21 1541 05/10/21 1741 05/10/21 2305 05/11/21 0033 05/11/21 0224 05/11/21 1037  HGB 11.9*  --   --   --   --  11.5*  --   HCT 39.4  --   --   --   --  38.2  --   PLT 248  --   --   --   --  240  --   HEPARINUNFRC  --   --   --   --   --  0.57 0.24*  CREATININE 1.11*  --   --   --   --   --  0.95  TROPONINIHS  --    < > 197* 173* 193*  --   --    < > = values in this interval not displayed.    Estimated Creatinine Clearance: 51.6 mL/min (by C-G formula based on SCr of 0.95 mg/dL).  Assessment: 80 y.o. female with chest pain/SOB for heparin.  Chest CT negative for PE. Pharmacy to dose IV heparin for ACS.  F/u heparin level subtherapeutic at  0.24 on 1,200 units/hr. CBC stable, no s/sx bleeding noted.   Goal of Therapy:  Heparin level 0.3-0.7 units/ml Monitor platelets by anticoagulation protocol: Yes   Plan:  Increase IV heparin infusion to 1,300 units/hr Follow up with heparin level in ~8 hours, then daily when stable Monitor CBC and s/sx bleeding  Mercy Riding, PharmD PGY1 Acute Care Pharmacy Resident Please refer to [facility] for unit-specific pharmacist

## 2021-05-11 NOTE — Progress Notes (Signed)
[facility] [place] for heparin Indication: chest pain/ACS  Allergies  Allergen Reactions  . Ativan [Lorazepam] Other (See Comments)    Confusion   . Pitavastatin Itching    Reaction to Livalo  . Ropinirole Nausea Only and Other (See Comments)    Makes legs jump, also  . Zofran [Ondansetron Hcl] Nausea Only  . Zolpidem Tartrate Anxiety and Other (See Comments)    CONFUSION   . Penicillins Rash and Other (See Comments)    Tolerated cephalosporins in past Has patient had a PCN reaction causing immediate rash, facial/tongue/throat swelling, SOB or lightheadedness with hypotension: Yes Has patient had a PCN reaction causing severe rash involving mucus membranes or skin necrosis: No Has patient had a PCN reaction that required hospitalization: No Has patient had a PCN reaction occurring within the last 10 years: No If all of the above answers are "NO", then may proceed with Cephalosporin use.     Patient Measurements: Height: 5\' 8"  (172.7 cm) Weight: 77.1 kg (169 lb 15.6 oz) IBW/kg (Calculated) : 63.9 Heparin Dosing Weight: 78kg  Vital Signs: Temp: 98.3 F (36.8 C) (05/21 2105) Temp Source: Oral (05/21 2105) BP: 116/56 (05/21 2105) Pulse Rate: 103 (05/21 2105)  Labs: Recent Labs    05/10/21 1515 05/10/21 1541 05/10/21 1741 05/10/21 2305 05/11/21 0033 05/11/21 0224 05/11/21 1037 05/11/21 1802  HGB 11.9*  --   --   --   --  11.5*  --   --   HCT 39.4  --   --   --   --  38.2  --   --   PLT 248  --   --   --   --  240  --   --   HEPARINUNFRC  --   --   --   --   --  0.57 0.24* 0.82*  CREATININE 1.11*  --   --   --   --   --  0.95  --   TROPONINIHS  --    < > 197* 173* 193*  --   --   --    < > = values in this interval not displayed.    Estimated Creatinine Clearance: 51.6 mL/min (by C-G formula based on SCr of 0.95 mg/dL).  Assessment: 80 y.o. female with chest pain/SOB for heparin.  Chest CT negative for PE. Pharmacy to dose IV heparin  for ACS.  -heparin level= 0.82 on 1300 units/hr  Goal of Therapy:  Heparin level 0.3-0.7 units/ml Monitor platelets by anticoagulation protocol: Yes   Plan:  Decrease heparin to 1200 units/hr Heparin level and CBC in am  Hildred Laser, PharmD Clinical Pharmacist **Pharmacist phone directory can now be found on [email or web address] ([email or web address]).  Listed under [place].

## 2021-05-11 NOTE — Progress Notes (Signed)
[facility] [place] for heparin Indication: chest pain/ACS  Allergies  Allergen Reactions  . Ativan [Lorazepam] Other (See Comments)    Confusion   . Pitavastatin Itching    Reaction to Livalo  . Ropinirole Nausea Only and Other (See Comments)    Makes legs jump, also  . Zofran [Ondansetron Hcl] Nausea Only  . Zolpidem Tartrate Anxiety and Other (See Comments)    CONFUSION   . Penicillins Rash and Other (See Comments)    Tolerated cephalosporins in past Has patient had a PCN reaction causing immediate rash, facial/tongue/throat swelling, SOB or lightheadedness with hypotension: Yes Has patient had a PCN reaction causing severe rash involving mucus membranes or skin necrosis: No Has patient had a PCN reaction that required hospitalization: No Has patient had a PCN reaction occurring within the last 10 years: No If all of the above answers are "NO", then may proceed with Cephalosporin use.     Patient Measurements: Height: 5\' 8"  (172.7 cm) Weight: 78 kg (172 lb) IBW/kg (Calculated) : 63.9 Heparin Dosing Weight: 78kg  Vital Signs: Temp: 98.3 F (36.8 C) (05/20 2259) Temp Source: Oral (05/20 2259) BP: 127/66 (05/21 0145) Pulse Rate: 76 (05/21 0145)  Labs: Recent Labs    05/10/21 1515 05/10/21 1541 05/10/21 1741 05/10/21 2305 05/11/21 0033 05/11/21 0224  HGB 11.9*  --   --   --   --  11.5*  HCT 39.4  --   --   --   --  38.2  PLT 248  --   --   --   --  240  HEPARINUNFRC  --   --   --   --   --  0.57  CREATININE 1.11*  --   --   --   --   --   TROPONINIHS  --    < > 197* 173* 193*  --    < > = values in this interval not displayed.    Estimated Creatinine Clearance: 44.4 mL/min (A) (by C-G formula based on SCr of 1.11 mg/dL (H)).  Assessment: 80 y.o. female with chest pain/SOB for heparin.  Chest CT negative for PE   Goal of Therapy:  Heparin level 0.3-0.7 units/ml Monitor platelets by anticoagulation protocol: Yes   Plan:   Continue Heparin at current rate   Phillis Knack, PharmD, BCPS  05/11/2021 3:48 AM

## 2021-05-11 NOTE — Plan of Care (Signed)

## 2021-05-12 DIAGNOSIS — I5033 Acute on chronic diastolic (congestive) heart failure: Secondary | ICD-10-CM

## 2021-05-12 DIAGNOSIS — J9621 Acute and chronic respiratory failure with hypoxia: Secondary | ICD-10-CM | POA: Diagnosis not present

## 2021-05-12 DIAGNOSIS — J441 Chronic obstructive pulmonary disease with (acute) exacerbation: Secondary | ICD-10-CM | POA: Diagnosis not present

## 2021-05-12 DIAGNOSIS — R079 Chest pain, unspecified: Secondary | ICD-10-CM | POA: Diagnosis not present

## 2021-05-12 LAB — BASIC METABOLIC PANEL
Anion gap: 8 (ref 5–15)
BUN: 19 mg/dL (ref 8–23)
CO2: 32 mmol/L (ref 22–32)
Calcium: 7.6 mg/dL — ABNORMAL LOW (ref 8.9–10.3)
Chloride: 98 mmol/L (ref 98–111)
Creatinine, Ser: 0.96 mg/dL (ref 0.44–1.00)
GFR, Estimated: 60 mL/min — ABNORMAL LOW (ref >60)
Glucose, Bld: 138 mg/dL — ABNORMAL HIGH (ref 70–99)
Potassium: 3.8 mmol/L (ref 3.5–5.1)
Sodium: 138 mmol/L (ref 135–145)

## 2021-05-12 LAB — CBC
HCT: 36.5 % (ref 36.0–46.0)
Hemoglobin: 11.2 g/dL — ABNORMAL LOW (ref 12.0–15.0)
MCH: 27.5 pg (ref 26.0–34.0)
MCHC: 30.7 g/dL (ref 30.0–36.0)
MCV: 89.5 fL (ref 80.0–100.0)
Platelets: 226 10*3/uL (ref 150–400)
RBC: 4.08 MIL/uL (ref 3.87–5.11)
RDW: 16.4 % — ABNORMAL HIGH (ref 11.5–15.5)
WBC: 12.8 10*3/uL — ABNORMAL HIGH (ref 4.0–10.5)
nRBC: 0 % (ref 0.0–0.2)

## 2021-05-12 LAB — HEPARIN LEVEL (UNFRACTIONATED): Heparin Unfractionated: 0.65 IU/mL (ref 0.30–0.70)

## 2021-05-12 MED ORDER — NYSTATIN 100000 UNIT/ML MT SUSP
5.0000 mL | Freq: Four times a day (QID) | OROMUCOSAL | Status: DC
  Administered 2021-05-12 – 2021-05-13 (×5): 500000 [IU] via ORAL
  Filled 2021-05-12 (×5): qty 5

## 2021-05-12 MED ORDER — IPRATROPIUM-ALBUTEROL 0.5-2.5 (3) MG/3ML IN SOLN
3.0000 mL | Freq: Four times a day (QID) | RESPIRATORY_TRACT | Status: DC | PRN
  Administered 2021-05-12: 3 mL via RESPIRATORY_TRACT
  Filled 2021-05-12: qty 3

## 2021-05-12 MED ORDER — HEPARIN SODIUM (PORCINE) 5000 UNIT/ML IJ SOLN
5000.0000 [IU] | Freq: Three times a day (TID) | INTRAMUSCULAR | Status: DC
  Administered 2021-05-12 – 2021-05-13 (×3): 5000 [IU] via SUBCUTANEOUS
  Filled 2021-05-12 (×3): qty 1

## 2021-05-12 MED ORDER — MAGIC MOUTHWASH W/LIDOCAINE
5.0000 mL | Freq: Four times a day (QID) | ORAL | Status: DC | PRN
  Filled 2021-05-12: qty 5

## 2021-05-12 MED ORDER — IPRATROPIUM-ALBUTEROL 0.5-2.5 (3) MG/3ML IN SOLN
3.0000 mL | Freq: Two times a day (BID) | RESPIRATORY_TRACT | Status: DC
  Administered 2021-05-12 – 2021-05-13 (×2): 3 mL via RESPIRATORY_TRACT
  Filled 2021-05-12 (×2): qty 3

## 2021-05-12 MED ORDER — FUROSEMIDE 80 MG PO TABS
80.0000 mg | ORAL_TABLET | Freq: Two times a day (BID) | ORAL | Status: DC
  Administered 2021-05-12 – 2021-05-13 (×2): 80 mg via ORAL
  Filled 2021-05-12 (×2): qty 1

## 2021-05-12 NOTE — Progress Notes (Signed)
PROGRESS NOTE    [name]  [id number] DOB: Nov 16, 1941 DOA: 05/10/2021 PCP: [name], MD   Brief Narrative:  [name] is a 80 y.o. female with medical history significant for CAD s/p CABG, hypertension, PAD, chronic diastolic heart failure, COPD with chronic hypoxemia on 4 L who presents with concerns of increasing shortness of breath. She was recently hospitalized from 5/9-5/14 when she presented with shortness of breath and was diagnosed with acute on chronic hypoxemic respiratory failure secondary to COPD exacerbation and diastolic CHF.  She was treated with IV steroids and IV Lasix, Doxy, nebs and was discharged home on prednisone taper. She was discharged stable on 3L. For the past 2 weeks, she has noticed increasing shortness of breath worse with exertion.  Also felt like she had to sleep with more pillows propped up.  Has been waking up feeling short of breath.  Has associated cough that is sometimes productive.  Also notes increasing lower extremity edema. Has constant mid-sternal chest ache that is worse with exertion.   About 2 days ago, her PCP increased her to 40 mg BID to 80 mg in the morning and 40 at night.  She continued to have great urine output and felt minimally better with increased diuretic regimen.  Also feels minimally improved with home nebulizer treatment.  Reports that she received a steroid injection in her hip 2 days ago that she says was for her breathing and not hip pain. Dr. Billy Fischer discussed with cardiology fellow who recommended starting her on IV heparin drip since she continues to have chest pain symptoms even at rest.  Assessment & Plan:   Active Problems:   Hypothyroidism   Dyslipidemia   Acute on chronic respiratory failure with hypoxia (HCC)   Chronic gout   COPD exacerbation (HCC)   Prolonged QT interval   RLS (restless legs syndrome)   Chest pain   Chronic pain   Acute exacerbation of congestive heart failure  (HCC)   Acute on chronic hypoxemic respiratory failure secondary COPD exacerbation - duoneb restarted - will continue her prednisone taper from previous admission - ambulation oxygen screen ongoing -We will likely benefit from outpatient pulmonology evaluation  Diastolic CHF exacerbation, last EF 60-65% - pt endorses symptoms of orthopnea with elevated BNP and minimally elevated troponin - Cardiology following - appreciate insight/recs - IV Lasix 40mg  BID - likely transition back to PO 80mg  BID per cards -Lengthy discussion about need for close monitoring at home with daily weights and frequent conversations with heart failure team if she begins to gain weight per their guidelines  Chest pain, unlikely ACS - Known history of CAD s/p CABG - Cardiology following - heparin infusion per their request, unlikely ACS given flat troponin and unremarkable EKG - Discussion of further outpatient stress once acute COPD/hypoxic event has resolved  Elevated d-dimer - Unclear etiology - CTA chest negative for PE. - She has hx of DVT off anticoagulation with reported history of GI bleed in the past per family at bedside. - DVT prelim negative  Prolonged QT-QTc of 480-490 - Hold SSRI and Abilify - Repeat EKG in the morning stable  Chronic pain - Continue home Xtampza ER Hypothyroidism-continue home Synthroid History of gout-continue home allopurinol Restless leg-continue home pramipexole Hyperlipidemia - on  Atorvastatin   DVT prophylaxis: Heparin drip off, transition to subcu Code Status: Full Family Communication: None present  Status is: Inpt  Dispo: The patient is from: Home  Anticipated d/c is to: Home              Anticipated d/c date is: 24-48h              Patient currently NOT medically stable for discharge  Consultants:   Cardiology  Procedures:   None  Antimicrobials:  None indicated   Subjective: No acute issues/events overnight -feels markedly  improved but not yet back to baseline, still limited with ambulation and exertional dyspnea.  Denies nausea vomiting chest pain headache fevers or chills.  Objective: Vitals:   05/11/21 2105 05/12/21 0006 05/12/21 0429 05/12/21 0434  BP: (!) 116/56 119/65 (!) 117/55   Pulse: (!) 103 84 90   Resp: 20 17 16    Temp: 98.3 F (36.8 C) 98.1 F (36.7 C) 98.4 F (36.9 C)   TempSrc: Oral Oral Oral   SpO2: 99% 98% 99%   Weight:    75.8 kg  Height:        Intake/Output Summary (Last 24 hours) at 05/12/2021 4097 Last data filed at 05/12/2021 0600 Gross per 24 hour  Intake 1309.49 ml  Output 1850 ml  Net -540.51 ml   Filed Weights   05/10/21 1510 05/11/21 0530 05/12/21 0434  Weight: 78 kg 77.1 kg 75.8 kg    Examination:  General:  Pleasantly resting in bed, No acute distress. HEENT:  Normocephalic atraumatic.  Sclerae nonicteric, noninjected.  Extraocular movements intact bilaterally. Neck:  Without mass or deformity.  Trachea is midline. Lungs: Coarse breath sounds without overt rhonchi, wheeze, or rales. Heart:  Regular rate and rhythm.  Without murmurs, rubs, or gallops. Abdomen:  Soft, nontender, nondistended.  Without guarding or rebound. Extremities: Without cyanosis, clubbing, scant bipedal edema. Vascular:  Dorsalis pedis and posterior tibial pulses palpable bilaterally. Skin:  Warm and dry, no erythema, no ulcerations.   Data Reviewed: I have personally reviewed following labs and imaging studies  CBC: Recent Labs  Lab 05/10/21 1515 05/11/21 0224 05/12/21 0324  WBC 18.1* 14.5* 12.8*  NEUTROABS 14.6*  --   --   HGB 11.9* 11.5* 11.2*  HCT 39.4 38.2 36.5  MCV 90.2 90.3 89.5  PLT 248 240 353   Basic Metabolic Panel: Recent Labs  Lab 05/10/21 1515 05/10/21 1541 05/11/21 1037 05/12/21 0324  NA 138  --  139 138  K 4.6  --  4.2 3.8  CL 97*  --  97* 98  CO2 35*  --  37* 32  GLUCOSE 108*  --  94 138*  BUN 30*  --  23 19  CREATININE 1.11*  --  0.95 0.96  CALCIUM  8.0*  --  7.9* 7.6*  MG  --  2.6*  --   --    GFR: Estimated Creatinine Clearance: 47.1 mL/min (by C-G formula based on SCr of 0.96 mg/dL). Liver Function Tests: No results for input(s): AST, ALT, ALKPHOS, BILITOT, PROT, ALBUMIN in the last 168 hours. No results for input(s): LIPASE, AMYLASE in the last 168 hours. No results for input(s): AMMONIA in the last 168 hours. Coagulation Profile: No results for input(s): INR, PROTIME in the last 168 hours. Cardiac Enzymes: No results for input(s): CKTOTAL, CKMB, CKMBINDEX, TROPONINI in the last 168 hours. BNP (last 3 results) Recent Labs    09/10/20 1535 02/21/21 1402 03/19/21 1321  PROBNP 745* 1,507* 1,355*   HbA1C: No results for input(s): HGBA1C in the last 72 hours. CBG: No results for input(s): GLUCAP in the last 168 hours. Lipid Profile: Recent Labs  05/10/21 2305  CHOL 81  HDL 47  LDLCALC 30  TRIG 22  CHOLHDL 1.7   Thyroid Function Tests: No results for input(s): TSH, T4TOTAL, FREET4, T3FREE, THYROIDAB in the last 72 hours. Anemia Panel: No results for input(s): VITAMINB12, FOLATE, FERRITIN, TIBC, IRON, RETICCTPCT in the last 72 hours. Sepsis Labs: Recent Labs  Lab 05/10/21 1515 05/10/21 1715  LATICACIDVEN 1.3 1.7    Recent Results (from the past 240 hour(s))  Resp Panel by RT-PCR (Flu A&B, Covid) Nasopharyngeal Swab     Status: None   Collection Time: 05/10/21  4:06 PM   Specimen: Nasopharyngeal Swab; Nasopharyngeal(NP) swabs in vial transport medium  Result Value Ref Range Status   SARS Coronavirus 2 by RT PCR NEGATIVE NEGATIVE Final    Comment: (NOTE) SARS-CoV-2 target nucleic acids are NOT DETECTED.  The SARS-CoV-2 RNA is generally detectable in upper respiratory specimens during the acute phase of infection. The lowest concentration of SARS-CoV-2 viral copies this assay can detect is 138 copies/mL. A negative result does not preclude SARS-Cov-2 infection and should not be used as the sole basis for  treatment or other patient management decisions. A negative result may occur with  improper specimen collection/handling, submission of specimen other than nasopharyngeal swab, presence of viral mutation(s) within the areas targeted by this assay, and inadequate number of viral copies(<138 copies/mL). A negative result must be combined with clinical observations, patient history, and epidemiological information. The expected result is Negative.  Fact Sheet for Patients:  EntrepreneurPulse.com.au  Fact Sheet for Healthcare Providers:  IncredibleEmployment.be  This test is no t yet approved or cleared by the [place] FDA and  has been authorized for detection and/or diagnosis of SARS-CoV-2 by FDA under an Emergency Use Authorization (EUA). This EUA will remain  in effect (meaning this test can be used) for the duration of the COVID-19 declaration under Section 564(b)(1) of the Act, 21 U.S.C.section 360bbb-3(b)(1), unless the authorization is terminated  or revoked sooner.       Influenza A by PCR NEGATIVE NEGATIVE Final   Influenza B by PCR NEGATIVE NEGATIVE Final    Comment: (NOTE) The Xpert Xpress SARS-CoV-2/FLU/RSV plus assay is intended as an aid in the diagnosis of influenza from Nasopharyngeal swab specimens and should not be used as a sole basis for treatment. Nasal washings and aspirates are unacceptable for Xpert Xpress SARS-CoV-2/FLU/RSV testing.  Fact Sheet for Patients: EntrepreneurPulse.com.au  Fact Sheet for Healthcare Providers: IncredibleEmployment.be  This test is not yet approved or cleared by the [place] FDA and has been authorized for detection and/or diagnosis of SARS-CoV-2 by FDA under an Emergency Use Authorization (EUA). This EUA will remain in effect (meaning this test can be used) for the duration of the COVID-19 declaration under Section 564(b)(1) of the Act, 21  U.S.C. section 360bbb-3(b)(1), unless the authorization is terminated or revoked.  Performed at [facility], [place] [address] [place], [place] [address]          Radiology Studies: DG Chest 2 View  Result Date: 05/10/2021 CLINICAL DATA:  Chest pain and shortness of breath. Bilateral leg swelling. EXAM: CHEST - 2 VIEW COMPARISON:  Chest x-ray dated mid tenth 2022. FINDINGS: Stable heart size status post CABG. Normal pulmonary vascularity. The lungs remain mildly hyperinflated with chronic mild interstitial coarsening. No focal consolidation, pleural effusion, or pneumothorax. No acute osseous abnormality. IMPRESSION: 1.  No active cardiopulmonary disease. 2. COPD. Electronically Signed   By: Titus Dubin M.D.   On: 05/10/2021  16:14   CT Angio Chest PE W and/or Wo Contrast  Result Date: 05/10/2021 CLINICAL DATA:  Shortness of breath. Previous history of pulmonary embolism. EXAM: CT ANGIOGRAPHY CHEST WITH CONTRAST TECHNIQUE: Multidetector CT imaging of the chest was performed using the standard protocol during bolus administration of intravenous contrast. Multiplanar CT image reconstructions and MIPs were obtained to evaluate the vascular anatomy. CONTRAST:  59mL OMNIPAQUE IOHEXOL 350 MG/ML SOLN COMPARISON:  12/01/2008 FINDINGS: Cardiovascular: Satisfactory opacification of pulmonary arteries noted, and no pulmonary emboli identified. No evidence of thoracic aortic. Aortic and coronary atherosclerotic calcification noted. Stable mild cardiomegaly. Mediastinum/Nodes: No masses or pathologically enlarged lymph nodes identified. Lungs/Pleura: No pulmonary mass, infiltrate, or effusion. Stable biapical pleural-parenchymal scarring. Upper abdomen: No acute findings. Musculoskeletal: No suspicious bone lesions identified. Review of the MIP images confirms the above findings. IMPRESSION: No evidence of pulmonary embolism or other active disease. Aortic Atherosclerosis (ICD10-I70.0). Electronically  Signed   By: Marlaine Hind M.D.   On: 05/10/2021 20:41   VAS [place] LOWER EXTREMITY VENOUS (DVT)  Result Date: 05/11/2021  Lower Venous DVT Study Patient Name:  [name]  Date of Exam:   05/11/2021 Medical Rec #: [id number]             Accession #:    [id number] Date of Birth: 1941/08/16              Patient Gender: F Patient Age:   79Y Exam Location:  [facility] Procedure:      VAS [place] LOWER EXTREMITY VENOUS (DVT) Referring Phys: [id number] [place] T TU --------------------------------------------------------------------------------  Indications: Swelling, and SOB.  Comparison Study: Prior study from 10/10/20 is available for comparison Performing Technologist: Sharion Dove RVS  Examination Guidelines: A complete evaluation includes B-mode imaging, spectral Doppler, color Doppler, and power Doppler as needed of all accessible portions of each vessel. Bilateral testing is considered an integral part of a complete examination. Limited examinations for reoccurring indications may be performed as noted. The reflux portion of the exam is performed with the patient in reverse Trendelenburg.  +---------+---------------+---------+-----------+----------+--------------+ RIGHT    CompressibilityPhasicitySpontaneityPropertiesThrombus Aging +---------+---------------+---------+-----------+----------+--------------+ CFV      Full           Yes      Yes                                 +---------+---------------+---------+-----------+----------+--------------+ SFJ      Full                                                        +---------+---------------+---------+-----------+----------+--------------+ FV Prox  Full                                                        +---------+---------------+---------+-----------+----------+--------------+ FV Mid   Full                                                         +---------+---------------+---------+-----------+----------+--------------+  FV DistalFull                                                        +---------+---------------+---------+-----------+----------+--------------+ PFV      Full                                                        +---------+---------------+---------+-----------+----------+--------------+ POP      Full           Yes      Yes                                 +---------+---------------+---------+-----------+----------+--------------+ PTV      Full                                                        +---------+---------------+---------+-----------+----------+--------------+ PERO     Full                                                        +---------+---------------+---------+-----------+----------+--------------+   +---------+---------------+---------+-----------+----------+--------------+ LEFT     CompressibilityPhasicitySpontaneityPropertiesThrombus Aging +---------+---------------+---------+-----------+----------+--------------+ CFV      Full           Yes      Yes                                 +---------+---------------+---------+-----------+----------+--------------+ SFJ      Full                                                        +---------+---------------+---------+-----------+----------+--------------+ FV Prox  Full                                                        +---------+---------------+---------+-----------+----------+--------------+ FV Mid   Full                                                        +---------+---------------+---------+-----------+----------+--------------+ FV DistalFull                                                        +---------+---------------+---------+-----------+----------+--------------+   PFV      Full                                                         +---------+---------------+---------+-----------+----------+--------------+ POP      Full           Yes      Yes                                 +---------+---------------+---------+-----------+----------+--------------+ PTV      Full                                                        +---------+---------------+---------+-----------+----------+--------------+ PERO     Full                                                        +---------+---------------+---------+-----------+----------+--------------+     Summary: BILATERAL: - No evidence of deep vein thrombosis seen in the lower extremities, bilaterally. - RIGHT: - No cystic structure found in the popliteal fossa.  LEFT: - No cystic structure found in the popliteal fossa.  *See table(s) above for measurements and observations. Electronically signed by Deitra Mayo MD on 05/11/2021 at 3:30:32 PM.    Final       Scheduled Meds: . allopurinol  100 mg Oral Q breakfast  . vitamin C  500 mg Oral Q lunch  . aspirin EC  81 mg Oral Q breakfast  . atorvastatin  20 mg Oral Q supper  . cholecalciferol  1,000 Units Oral Q supper  . cyclobenzaprine  10 mg Oral Daily  . ferrous sulfate  325 mg Oral Q breakfast  . furosemide  40 mg Intravenous BID  . gabapentin  100 mg Oral Q breakfast  . ipratropium-albuterol  3 mL Nebulization Q6H  . levothyroxine  50 mcg Oral Q0600  . magnesium oxide  400 mg Oral BID  . melatonin  10 mg Oral QHS  . oxyCODONE  10 mg Oral BID  . pantoprazole  40 mg Oral Daily  . potassium chloride SA  20 mEq Oral Q lunch  . pramipexole  0.25 mg Oral Q supper  . roflumilast  500 mcg Oral Q breakfast  . spironolactone  12.5 mg Oral Daily  . traZODone  150 mg Oral QHS   Continuous Infusions: . heparin 1,200 Units/hr (05/11/21 2125)     LOS: 1 day    Time spent: 50min  Ricarda Atayde C Shekira Drummer, DO [facility]  If 7PM-7AM, please contact night-coverage [email or web address]  05/12/2021, 7:02 AM

## 2021-05-12 NOTE — Progress Notes (Signed)
DAILY PROGRESS NOTE   Patient Name: [name] Date of Encounter: 05/12/2021 Cardiologist: Dorris Carnes, MD  Chief Complaint   No complaints  Patient Profile   Ruchi Stoney is a 80 y.o. female with a hx of CAD (s/p CABG 2004; DES to LM in 2010; myovue in 2018: Low risk with distal anterior and apical ischemia), chronic diastolic CHF, CV dz, PAD recurrent DVT/PE, GI bleeding on anticoagulation COPD, PAF (after CABG), GERD, HL, HTN, renal insufficiency, sleep apnea (not on CPAP)  who is being seen 05/11/2021 for the evaluation of CHF  at the request of Dr. Avon Gully.  Subjective   Net negative 1.5L total - plan to transition back to oral diuretics today. Creatinine stable - WBC ct improving. No plan for ischemia eval - IV heparin has been discontinued. On nebulizer today.  Objective   Vitals:   05/12/21 0429 05/12/21 0434 05/12/21 0744 05/12/21 0846  BP: (!) 117/55  121/80 (!) 134/58  Pulse: 90   100  Resp: 16   19  Temp: 98.4 F (36.9 C)   98.5 F (36.9 C)  TempSrc: Oral   Oral  SpO2: 99%   97%  Weight:  75.8 kg    Height:        Intake/Output Summary (Last 24 hours) at 05/12/2021 1201 Last data filed at 05/12/2021 1101 Gross per 24 hour  Intake 1281.36 ml  Output 3450 ml  Net -2168.64 ml   Filed Weights   05/10/21 1510 05/11/21 0530 05/12/21 0434  Weight: 78 kg 77.1 kg 75.8 kg    Physical Exam   General appearance: alert and no distress Lungs: diminished breath sounds bilaterally and wheezes bilaterally Heart: regular rate and rhythm Extremities: extremities normal, atraumatic, no cyanosis or edema Neurologic: Grossly normal  Inpatient Medications    Scheduled Meds: . allopurinol  100 mg Oral Q breakfast  . vitamin C  500 mg Oral Q lunch  . aspirin EC  81 mg Oral Q breakfast  . atorvastatin  20 mg Oral Q supper  . cholecalciferol  1,000 Units Oral Q supper  . cyclobenzaprine  10 mg Oral Daily  . ferrous sulfate  325 mg Oral Q  breakfast  . furosemide  40 mg Intravenous BID  . gabapentin  100 mg Oral Q breakfast  . heparin injection (subcutaneous)  5,000 Units Subcutaneous Q8H  . ipratropium-albuterol  3 mL Nebulization BID  . levothyroxine  50 mcg Oral Q0600  . magnesium oxide  400 mg Oral BID  . melatonin  10 mg Oral QHS  . nystatin  5 mL Oral QID  . oxyCODONE  10 mg Oral BID  . pantoprazole  40 mg Oral Daily  . potassium chloride SA  20 mEq Oral Q lunch  . pramipexole  0.25 mg Oral Q supper  . roflumilast  500 mcg Oral Q breakfast  . spironolactone  12.5 mg Oral Daily  . traZODone  150 mg Oral QHS    Continuous Infusions:   PRN Meds: acetaminophen, ipratropium-albuterol, magic mouthwash w/lidocaine, nitroGLYCERIN   Labs   Results for orders placed or performed during the hospital encounter of 05/10/21 (from the past 48 hour(s))  Basic metabolic panel     Status: Abnormal   Collection Time: 05/10/21  3:15 PM  Result Value Ref Range   Sodium 138 135 - 145 mmol/L   Potassium 4.6 3.5 - 5.1 mmol/L   Chloride 97 (L) 98 - 111 mmol/L   CO2 35 (H) 22 -  32 mmol/L   Glucose, Bld 108 (H) 70 - 99 mg/dL    Comment: Glucose reference range applies only to samples taken after fasting for at least 8 hours.   BUN 30 (H) 8 - 23 mg/dL   Creatinine, Ser 1.11 (H) 0.44 - 1.00 mg/dL   Calcium 8.0 (L) 8.9 - 10.3 mg/dL   GFR, Estimated 50 (L) >60 mL/min    Comment: (NOTE) Calculated using the CKD-EPI Creatinine Equation (2021)    Anion gap 6 5 - 15    Comment: Performed at [facility] [address] [place], [place] [address]  CBC with Differential     Status: Abnormal   Collection Time: 05/10/21  3:15 PM  Result Value Ref Range   WBC 18.1 (H) 4.0 - 10.5 K/uL   RBC 4.37 3.87 - 5.11 MIL/uL   Hemoglobin 11.9 (L) 12.0 - 15.0 g/dL   HCT 39.4 36.0 - 46.0 %   MCV 90.2 80.0 - 100.0 fL   MCH 27.2 26.0 - 34.0 pg   MCHC 30.2 30.0 - 36.0 g/dL   RDW 16.1 (H) 11.5 - 15.5 %   Platelets 248 150 - 400 K/uL   nRBC  0.0 0.0 - 0.2 %   Neutrophils Relative % 81 %   Neutro Abs 14.6 (H) 1.7 - 7.7 K/uL   Lymphocytes Relative 7 %   Lymphs Abs 1.3 0.7 - 4.0 K/uL   Monocytes Relative 8 %   Monocytes Absolute 1.4 (H) 0.1 - 1.0 K/uL   Eosinophils Relative 0 %   Eosinophils Absolute 0.0 0.0 - 0.5 K/uL   Basophils Relative 0 %   Basophils Absolute 0.1 0.0 - 0.1 K/uL   Immature Granulocytes 4 %   Abs Immature Granulocytes 0.71 (H) 0.00 - 0.07 K/uL    Comment: Performed at [facility], [address], [place], [place] [address]  Brain natriuretic peptide     Status: Abnormal   Collection Time: 05/10/21  3:15 PM  Result Value Ref Range   B Natriuretic Peptide 471.5 (H) 0.0 - 100.0 pg/mL    Comment: Performed at [facility] [address] [place], [place] [address]  Lactic acid, plasma     Status: None   Collection Time: 05/10/21  3:15 PM  Result Value Ref Range   Lactic Acid, Venous 1.3 0.5 - 1.9 mmol/L    Comment: Performed at [facility] [address], [place], [place] [address]  Culture, blood (single)     Status: None (Preliminary result)   Collection Time: 05/10/21  3:20 PM   Specimen: BLOOD  Result Value Ref Range   Specimen Description BLOOD LEFT ANTECUBITAL    Special Requests      BOTTLES DRAWN AEROBIC AND ANAEROBIC Blood Culture adequate volume   Culture      NO GROWTH 2 DAYS Performed at [facility], [place] [address] [place],  [address]    Report Status PENDING   Troponin I (High Sensitivity)     Status: Abnormal   Collection Time: 05/10/21  3:41 PM  Result Value Ref Range   Troponin I (High Sensitivity) 212 (HH) <18 ng/L    Comment: CRITICAL RESULT CALLED TO, READ BACK BY AND VERIFIED WITH: Irena Reichmann RN [phone] K FORSYTH  (NOTE) Elevated high sensitivity troponin I (hsTnI) values and significant  changes across serial measurements may suggest ACS but many other  chronic and acute conditions are known to elevate hsTnI results.  Refer to the  Links section for chest pain algorithms and additional  guidance. Performed at [facility], [place] [address], [place], [place] [address]   Magnesium     Status: Abnormal   Collection Time: 05/10/21  3:41 PM  Result Value Ref Range   Magnesium 2.6 (H) 1.7 - 2.4 mg/dL    Comment: Performed at [facility] [address], [place], [place] [address]  Resp Panel by RT-PCR (Flu A&B, Covid) Nasopharyngeal Swab     Status: None   Collection Time: 05/10/21  4:06 PM   Specimen: Nasopharyngeal Swab; Nasopharyngeal(NP) swabs in vial transport medium  Result Value Ref Range   SARS Coronavirus 2 by RT PCR NEGATIVE NEGATIVE    Comment: (NOTE) SARS-CoV-2 target nucleic acids are NOT DETECTED.  The SARS-CoV-2 RNA is generally detectable in upper respiratory specimens during the acute phase of infection. The lowest concentration of SARS-CoV-2 viral copies this assay can detect is 138 copies/mL. A negative result does not preclude SARS-Cov-2 infection and should not be used as the sole basis for treatment or other patient management decisions. A negative result may occur with  improper specimen collection/handling, submission of specimen other than nasopharyngeal swab, presence of viral mutation(s) within the areas targeted by this assay, and inadequate number of viral copies(<138 copies/mL). A negative result must be combined with clinical observations, patient history, and epidemiological information. The expected result is Negative.  Fact Sheet for Patients:  EntrepreneurPulse.com.au  Fact Sheet for Healthcare Providers:  IncredibleEmployment.be  This test is no t yet approved or cleared by the [place] FDA and  has been authorized for detection and/or diagnosis of SARS-CoV-2 by FDA under an Emergency Use Authorization (EUA). This EUA will remain  in effect (meaning this test can be used) for the duration of the COVID-19 declaration under  Section 564(b)(1) of the Act, 21 U.S.C.section 360bbb-3(b)(1), unless the authorization is terminated  or revoked sooner.       Influenza A by PCR NEGATIVE NEGATIVE   Influenza B by PCR NEGATIVE NEGATIVE    Comment: (NOTE) The Xpert Xpress SARS-CoV-2/FLU/RSV plus assay is intended as an aid in the diagnosis of influenza from Nasopharyngeal swab specimens and should not be used as a sole basis for treatment. Nasal washings and aspirates are unacceptable for Xpert Xpress SARS-CoV-2/FLU/RSV testing.  Fact Sheet for Patients: EntrepreneurPulse.com.au  Fact Sheet for Healthcare Providers: IncredibleEmployment.be  This test is not yet approved or cleared by the [place] FDA and has been authorized for detection and/or diagnosis of SARS-CoV-2 by FDA under an Emergency Use Authorization (EUA). This EUA will remain in effect (meaning this test can be used) for the duration of the COVID-19 declaration under Section 564(b)(1) of the Act, 21 U.S.C. section 360bbb-3(b)(1), unless the authorization is terminated or revoked.  Performed at [facility], [place] [address] [name], [place] [address]   Lactic acid, plasma     Status: None   Collection Time: 05/10/21  5:15 PM  Result Value Ref Range   Lactic Acid, Venous 1.7 0.5 - 1.9 mmol/L    Comment: Performed at [facility] [address], [place], [place] [address]  Troponin I (High Sensitivity)     Status: Abnormal   Collection Time: 05/10/21  5:41 PM  Result Value Ref Range   Troponin I (High Sensitivity) 197 (HH) <18 ng/L    Comment: CRITICAL VALUE NOTED.  VALUE IS CONSISTENT WITH PREVIOUSLY REPORTED AND CALLED VALUE. (NOTE) Elevated  high sensitivity troponin I (hsTnI) values and significant  changes across serial measurements may suggest ACS but many other  chronic and acute conditions are known to elevate hsTnI results.  Refer to the Links section for chest pain algorithms and  additional  guidance. Performed at [facility], [place] [address], [place], [place] [address]   D-dimer, quantitative     Status: Abnormal   Collection Time: 05/10/21  5:41 PM  Result Value Ref Range   D-Dimer, Quant 3.23 (H) 0.00 - 0.50 ug/mL-FEU    Comment: (NOTE) At the manufacturer cut-off value of 0.5 g/mL FEU, this assay has a negative predictive value of 95-100%.This assay is intended for use in conjunction with a clinical pretest probability (PTP) assessment model to exclude pulmonary embolism (PE) and deep venous thrombosis (DVT) in outpatients suspected of PE or DVT. Results should be correlated with clinical presentation. Performed at [facility], [place] [address], [place], [place] [address]   Troponin I (High Sensitivity)     Status: Abnormal   Collection Time: 05/10/21 11:05 PM  Result Value Ref Range   Troponin I (High Sensitivity) 173 (HH) <18 ng/L    Comment: CRITICAL VALUE NOTED.  VALUE IS CONSISTENT WITH PREVIOUSLY REPORTED AND CALLED VALUE. (NOTE) Elevated high sensitivity troponin I (hsTnI) values and significant  changes across serial measurements may suggest ACS but many other  chronic and acute conditions are known to elevate hsTnI results.  Refer to the Links section for chest pain algorithms and additional  guidance. Performed at [facility], [place] [address], [place], [place] [address]   Lipid panel     Status: None   Collection Time: 05/10/21 11:05 PM  Result Value Ref Range   Cholesterol 81 0 - 200 mg/dL   Triglycerides 22 <150 mg/dL   HDL 47 >40 mg/dL   Total CHOL/HDL Ratio 1.7 RATIO   VLDL 4 0 - 40 mg/dL   LDL Cholesterol 30 0 - 99 mg/dL    Comment:        Total Cholesterol/HDL:CHD Risk Coronary Heart Disease Risk Table                     Men   Women  1/2 Average Risk   3.4   3.3  Average Risk       5.0   4.4  2 X Average Risk   9.6   7.1  3 X Average Risk  23.4   11.0        Use the calculated Patient Ratio above and  the CHD Risk Table to determine the patient's CHD Risk.        ATP III CLASSIFICATION (LDL):  <100     mg/dL   Optimal  100-129  mg/dL   Near or Above                    Optimal  130-159  mg/dL   Borderline  160-189  mg/dL   High  >190     mg/dL   Very High Performed at [facility] [address] [place], [place] [address]   Troponin I (High Sensitivity)     Status: Abnormal   Collection Time: 05/11/21 12:33 AM  Result Value Ref Range   Troponin I (High Sensitivity) 193 (HH) <18 ng/L    Comment: CRITICAL VALUE NOTED.  VALUE IS CONSISTENT WITH PREVIOUSLY REPORTED AND CALLED VALUE. (NOTE) Elevated high sensitivity troponin I (hsTnI)  values and significant  changes across serial measurements may suggest ACS but many other  chronic and acute conditions are known to elevate hsTnI results.  Refer to the Links section for chest pain algorithms and additional  guidance. Performed at [facility], [place] [address], [place], [place] [address]   Heparin level (unfractionated)     Status: None   Collection Time: 05/11/21  2:24 AM  Result Value Ref Range   Heparin Unfractionated 0.57 0.30 - 0.70 IU/mL    Comment: (NOTE) The clinical reportable range upper limit is being lowered to >1.10 to align with the FDA approved guidance for the current laboratory assay.  If heparin results are below expected values, and patient dosage has  been confirmed, suggest follow up testing of antithrombin III levels. Performed at [facility], [place] [address], [place], [place] [address]   CBC     Status: Abnormal   Collection Time: 05/11/21  2:24 AM  Result Value Ref Range   WBC 14.5 (H) 4.0 - 10.5 K/uL   RBC 4.23 3.87 - 5.11 MIL/uL   Hemoglobin 11.5 (L) 12.0 - 15.0 g/dL   HCT 38.2 36.0 - 46.0 %   MCV 90.3 80.0 - 100.0 fL   MCH 27.2 26.0 - 34.0 pg   MCHC 30.1 30.0 - 36.0 g/dL   RDW 16.3 (H) 11.5 - 15.5 %   Platelets 240 150 - 400 K/uL   nRBC 0.0 0.0 - 0.2 %    Comment: Performed  at [facility], [place] [address] [place], [place] [address]  Heparin level (unfractionated)     Status: Abnormal   Collection Time: 05/11/21 10:37 AM  Result Value Ref Range   Heparin Unfractionated 0.24 (L) 0.30 - 0.70 IU/mL    Comment: (NOTE) The clinical reportable range upper limit is being lowered to >1.10 to align with the FDA approved guidance for the current laboratory assay.  If heparin results are below expected values, and patient dosage has  been confirmed, suggest follow up testing of antithrombin III levels. Performed at [facility], [place] [address] [place], [place] [id number]   Basic metabolic panel     Status: Abnormal   Collection Time: 05/11/21 10:37 AM  Result Value Ref Range   Sodium 139 135 - 145 mmol/L   Potassium 4.2 3.5 - 5.1 mmol/L   Chloride 97 (L) 98 - 111 mmol/L   CO2 37 (H) 22 - 32 mmol/L   Glucose, Bld 94 70 - 99 mg/dL    Comment: Glucose reference range applies only to samples taken after fasting for at least 8 hours.   BUN 23 8 - 23 mg/dL   Creatinine, Ser 0.95 0.44 - 1.00 mg/dL   Calcium 7.9 (L) 8.9 - 10.3 mg/dL   GFR, Estimated >60 >60 mL/min    Comment: (NOTE) Calculated using the CKD-EPI Creatinine Equation (2021)    Anion gap 5 5 - 15    Comment: Performed at [facility] [address], [place], [place] [address]  Heparin level (unfractionated)     Status: Abnormal   Collection Time: 05/11/21  6:02 PM  Result Value Ref Range   Heparin Unfractionated 0.82 (H) 0.30 - 0.70 IU/mL    Comment: (NOTE) The clinical reportable range upper limit is being lowered to >1.10 to align with the FDA approved guidance for the current laboratory assay.  If heparin results are below expected values, and patient dosage has  been confirmed,  suggest follow up testing of antithrombin III levels. Performed at [facility], [place] [address], [place], [place] [address]   Heparin level (unfractionated)     Status: None   Collection  Time: 05/12/21  3:24 AM  Result Value Ref Range   Heparin Unfractionated 0.65 0.30 - 0.70 IU/mL    Comment: (NOTE) The clinical reportable range upper limit is being lowered to >1.10 to align with the FDA approved guidance for the current laboratory assay.  If heparin results are below expected values, and patient dosage has  been confirmed, suggest follow up testing of antithrombin III levels. Performed at [facility], [place] [address], [place], [place] [address]   CBC     Status: Abnormal   Collection Time: 05/12/21  3:24 AM  Result Value Ref Range   WBC 12.8 (H) 4.0 - 10.5 K/uL   RBC 4.08 3.87 - 5.11 MIL/uL   Hemoglobin 11.2 (L) 12.0 - 15.0 g/dL   HCT 36.5 36.0 - 46.0 %   MCV 89.5 80.0 - 100.0 fL   MCH 27.5 26.0 - 34.0 pg   MCHC 30.7 30.0 - 36.0 g/dL   RDW 16.4 (H) 11.5 - 15.5 %   Platelets 226 150 - 400 K/uL   nRBC 0.0 0.0 - 0.2 %    Comment: Performed at [facility], [place] [address] [place], [place] [id number]  Basic metabolic panel     Status: Abnormal   Collection Time: 05/12/21  3:24 AM  Result Value Ref Range   Sodium 138 135 - 145 mmol/L   Potassium 3.8 3.5 - 5.1 mmol/L   Chloride 98 98 - 111 mmol/L   CO2 32 22 - 32 mmol/L   Glucose, Bld 138 (H) 70 - 99 mg/dL    Comment: Glucose reference range applies only to samples taken after fasting for at least 8 hours.   BUN 19 8 - 23 mg/dL   Creatinine, Ser 0.96 0.44 - 1.00 mg/dL   Calcium 7.6 (L) 8.9 - 10.3 mg/dL   GFR, Estimated 60 (L) >60 mL/min    Comment: (NOTE) Calculated using the CKD-EPI Creatinine Equation (2021)    Anion gap 8 5 - 15    Comment: Performed at [facility] [address], [place], [place] [address]    ECG   N/A  Telemetry   Sinus rhythm - Personally Reviewed  Radiology    DG Chest 2 View  Result Date: 05/10/2021 CLINICAL DATA:  Chest pain and shortness of breath. Bilateral leg swelling. EXAM: CHEST - 2 VIEW COMPARISON:  Chest x-ray dated mid tenth 2022. FINDINGS:  Stable heart size status post CABG. Normal pulmonary vascularity. The lungs remain mildly hyperinflated with chronic mild interstitial coarsening. No focal consolidation, pleural effusion, or pneumothorax. No acute osseous abnormality. IMPRESSION: 1.  No active cardiopulmonary disease. 2. COPD. Electronically Signed   By: Titus Dubin M.D.   On: 05/10/2021 16:14   CT Angio Chest PE W and/or Wo Contrast  Result Date: 05/10/2021 CLINICAL DATA:  Shortness of breath. Previous history of pulmonary embolism. EXAM: CT ANGIOGRAPHY CHEST WITH CONTRAST TECHNIQUE: Multidetector CT imaging of the chest was performed using the standard protocol during bolus administration of intravenous contrast. Multiplanar CT image reconstructions and MIPs were obtained to evaluate the vascular anatomy. CONTRAST:  76mL OMNIPAQUE IOHEXOL 350 MG/ML SOLN COMPARISON:  12/01/2008 FINDINGS: Cardiovascular: Satisfactory opacification of pulmonary arteries noted, and no pulmonary emboli identified. No evidence of thoracic aortic. Aortic and  coronary atherosclerotic calcification noted. Stable mild cardiomegaly. Mediastinum/Nodes: No masses or pathologically enlarged lymph nodes identified. Lungs/Pleura: No pulmonary mass, infiltrate, or effusion. Stable biapical pleural-parenchymal scarring. Upper abdomen: No acute findings. Musculoskeletal: No suspicious bone lesions identified. Review of the MIP images confirms the above findings. IMPRESSION: No evidence of pulmonary embolism or other active disease. Aortic Atherosclerosis (ICD10-I70.0). Electronically Signed   By: Marlaine Hind M.D.   On: 05/10/2021 20:41   VAS [place] LOWER EXTREMITY VENOUS (DVT)  Result Date: 05/11/2021  Lower Venous DVT Study Patient Name:  KIRALYN STUMBO  Date of Exam:   05/11/2021 Medical Rec #: [id number]             Accession #:    [id number] Date of Birth: 1941/08/20              Patient Gender: F Patient Age:   79Y Exam [facility]  [facility] Procedure:       VAS [place] LOWER EXTREMITY VENOUS (DVT) Referring Phys: [id number] [place] T TU --------------------------------------------------------------------------------  Indications: Swelling, and SOB.  Comparison Study: Prior study from 10/10/20 is available for comparison Performing Technologist: Sharion Dove RVS  Examination Guidelines: A complete evaluation includes B-mode imaging, spectral Doppler, color Doppler, and power Doppler as needed of all accessible portions of each vessel. Bilateral testing is considered an integral part of a complete examination. Limited examinations for reoccurring indications may be performed as noted. The reflux portion of the exam is performed with the patient in reverse Trendelenburg.  +---------+---------------+---------+-----------+----------+--------------+ RIGHT    CompressibilityPhasicitySpontaneityPropertiesThrombus Aging +---------+---------------+---------+-----------+----------+--------------+ CFV      Full           Yes      Yes                                 +---------+---------------+---------+-----------+----------+--------------+ SFJ      Full                                                        +---------+---------------+---------+-----------+----------+--------------+ FV Prox  Full                                                        +---------+---------------+---------+-----------+----------+--------------+ FV Mid   Full                                                        +---------+---------------+---------+-----------+----------+--------------+ FV DistalFull                                                        +---------+---------------+---------+-----------+----------+--------------+ PFV      Full                                                        +---------+---------------+---------+-----------+----------+--------------+  POP      Full           Yes      Yes                                  +---------+---------------+---------+-----------+----------+--------------+ PTV      Full                                                        +---------+---------------+---------+-----------+----------+--------------+ PERO     Full                                                        +---------+---------------+---------+-----------+----------+--------------+   +---------+---------------+---------+-----------+----------+--------------+ LEFT     CompressibilityPhasicitySpontaneityPropertiesThrombus Aging +---------+---------------+---------+-----------+----------+--------------+ CFV      Full           Yes      Yes                                 +---------+---------------+---------+-----------+----------+--------------+ SFJ      Full                                                        +---------+---------------+---------+-----------+----------+--------------+ FV Prox  Full                                                        +---------+---------------+---------+-----------+----------+--------------+ FV Mid   Full                                                        +---------+---------------+---------+-----------+----------+--------------+ FV DistalFull                                                        +---------+---------------+---------+-----------+----------+--------------+ PFV      Full                                                        +---------+---------------+---------+-----------+----------+--------------+ POP      Full           Yes      Yes                                 +---------+---------------+---------+-----------+----------+--------------+  PTV      Full                                                        +---------+---------------+---------+-----------+----------+--------------+ PERO     Full                                                         +---------+---------------+---------+-----------+----------+--------------+     Summary: BILATERAL: - No evidence of deep vein thrombosis seen in the lower extremities, bilaterally. - RIGHT: - No cystic structure found in the popliteal fossa.  LEFT: - No cystic structure found in the popliteal fossa.  *See table(s) above for measurements and observations. Electronically signed by Deitra Mayo MD on 05/11/2021 at 3:30:32 PM.    Final     Cardiac Studies   See above  Assessment   1. Active Problems: 2.   Hypothyroidism 3.   Dyslipidemia 4.   Acute on chronic respiratory failure with hypoxia (HCC) 5.   Chronic gout 6.   COPD exacerbation ([place]) 7.   Prolonged QT interval 8.   RLS (restless legs syndrome) 9.   Chest pain 10.   Chronic pain 11.   Acute exacerbation of congestive heart failure ([place]) 12.   Plan   1. Appears adequately diuresed at this point - will transition to oral lasix 80 mg po BID starting tonight - continue with pulmonary toileting.  Time Spent Directly with Patient:  I have spent a total of 25 minutes with the patient reviewing hospital notes, telemetry, EKGs, labs and examining the patient as well as establishing an assessment and plan that was discussed personally with the patient.  > 50% of time was spent in direct patient care.  Length of Stay:  LOS: 1 day   Pixie Casino, MD, [facility], [place] Director of the [facility] Lipid [facility] &  Card[facility] Diplomate of the American Board of Clinical Lipidology Attending Cardiologist  Direct Dial: [phone]  Fax: [phone]  Website:  [email or web address] 05/12/2021, 12:01 PM

## 2021-05-12 NOTE — Progress Notes (Signed)
Patient ambulated approximately 40 feet in hallway with nurse. Front wheel walker used. Patient did experience SOB while ambulating. O2 saturation 96% on 4 L. Patient resting in chair.

## 2021-05-13 ENCOUNTER — Ambulatory Visit: Payer: Self-pay

## 2021-05-13 DIAGNOSIS — R0602 Shortness of breath: Secondary | ICD-10-CM

## 2021-05-13 DIAGNOSIS — I208 Other forms of angina pectoris: Secondary | ICD-10-CM

## 2021-05-13 LAB — BASIC METABOLIC PANEL
Anion gap: 6 (ref 5–15)
BUN: 22 mg/dL (ref 8–23)
CO2: 35 mmol/L — ABNORMAL HIGH (ref 22–32)
Calcium: 8.8 mg/dL — ABNORMAL LOW (ref 8.9–10.3)
Chloride: 93 mmol/L — ABNORMAL LOW (ref 98–111)
Creatinine, Ser: 1.19 mg/dL — ABNORMAL HIGH (ref 0.44–1.00)
GFR, Estimated: 46 mL/min — ABNORMAL LOW (ref >60)
Glucose, Bld: 121 mg/dL — ABNORMAL HIGH (ref 70–99)
Potassium: 4.8 mmol/L (ref 3.5–5.1)
Sodium: 134 mmol/L — ABNORMAL LOW (ref 135–145)

## 2021-05-13 LAB — MAGNESIUM: Magnesium: 2 mg/dL (ref 1.7–2.4)

## 2021-05-13 MED ORDER — NYSTATIN 100000 UNIT/ML MT SUSP: 5.0000 mL | Freq: Four times a day (QID) | OROMUCOSAL | 0 refills | Status: DC

## 2021-05-13 MED ORDER — TRELEGY ELLIPTA 100-62.5-25 MCG/INH IN AEPB: 1.0000 | INHALATION_SPRAY | Freq: Every day | RESPIRATORY_TRACT | 5 refills | Status: AC

## 2021-05-13 MED ORDER — FUROSEMIDE 80 MG PO TABS: 80.0000 mg | ORAL_TABLET | Freq: Every day | ORAL | Status: DC

## 2021-05-13 MED ORDER — FUROSEMIDE 80 MG PO TABS: 80.0000 mg | ORAL_TABLET | Freq: Every day | ORAL | 1 refills | Status: DC

## 2021-05-13 NOTE — Plan of Care (Signed)

## 2021-05-13 NOTE — Progress Notes (Signed)
Heart Failure Navigator Progress Note  Assessed for Heart & Vascular TOC clinic readiness.  Unfortunately at this time the patient does not meet criteria due to dyspnea and wheezing thought to be more related to COPD.   Navigator available for reassessment of patient.   Kerby Nora, PharmD, BCPS Heart Failure Stewardship Pharmacist Phone [phone]

## 2021-05-13 NOTE — Discharge Summary (Signed)
Physician Discharge Summary  [name] [id number] DOB: 01/12/1941 DOA: 05/10/2021  PCP: [name], MD  Admit date: 05/10/2021 Discharge date: 05/13/2021  Admitted From: Home Disposition:  Home  Recommendations for Outpatient Follow-up:  1. Follow up with PCP in 1-2 weeks 2. Please obtain BMP/CBC in one week 3. Please follow up on the following pending results:  Home Health: Resume HHPT  Equipment/Devices:No new DME  Discharge Condition: Stable CODE STATUS: Full Diet recommendation: Low-salt low-fat diet  Brief/Interim Summary: [name] a 80 y.o.femalewith medical history significant forCAD s/p CABG, hypertension, PAD, chronic diastolic heart failure, COPD with chronic hypoxemia on 4 L who presents with concerns of increasing shortness of breath. She was recently hospitalized from 5/9-5/14 when she presented with shortness of breath and was diagnosed with acute on chronic hypoxemic respiratory failure secondary to COPD exacerbation and diastolic CHF. She was treated with IV steroids and IV Lasix, Doxy, nebs and was discharged home on prednisone taper. She was discharged stable on 3L. For the past 2 weeks, she has noticed increasing shortness of breath worse with exertion. Also feltlike she had to sleep with more pillows propped up. Hasbeen waking up feeling short of breath. Has associated cough that is sometimes productive. Also notes increasing lower extremity edema. Has constant mid-sternal chest ache that is worse with exertion.About 2 days ago, her PCP increased her to 40 mg BIDto 80 mg in the morning and 40 at night. She continuedto have great urine output and felt minimally better with increased diuretic regimen. Also feels minimally improved with home nebulizer treatment. Reportsthat she received a steroid injection in her hip 2 days agothat she says was for her breathing and not hip pain. Dr. Billy Fischer discussedwith cardiology fellow  who recommended starting her on IV heparin drip since she continues to have chest pain symptoms even at rest.  Patient admitted as above for acute hypoxic respiratory failure on chronic hypoxic respiratory failure -likely multifactorial given her profound history of COPD and heart failure.  She was admitted with bilateral wheezing as well as diffuse edema concerning for concurrent COPD flare and heart failure exacerbation.  Both likely in the setting of medication noncompliance and dietary noncompliance.  Patient does live alone at home and at her age does have difficulty cooking which means her diet is typically limited by what other people provide her.  Cardiology and pulmonology were kind enough to consult and assist with patient's multifactorial admission.  Patient had reported transient chest pain and was placed on heparin at admission but given her reassuring troponin and EKG Heparin was ultimately stopped and patient was not further evaluated for ACS given its unlikely nature.  Patient was aggressively diuresed per cardiology, appears euvolemic now which likely has improved respiratory status as above.  Notably patient did have elevated D-dimer but CTA chest was negative for PE and there were no DVT on lower extremity ultrasounds.  She does have a history of DVT but is not on anticoagulation.  Profound GI bleed per family.  In regards to her COPD on steroids with duo nebs and titration of her inhaled medications by pulmonology respiratory status appears to be resolving she is now able to ambulate without hypoxia or profound symptoms at her previous baseline which, while limited, with reassuring that she is improving appropriately.  Of note patient did have moderately prolonged QTc at intake as such her Abilify and Celexa were discontinued.  We will resume Abilify at discharge but hold Celexa given pulmonology's recommendation  that she may benefit from prolonged azithromycin in the future which would be  limited by QT prolongation.  At this time patient is essentially back to baseline per family at bedside, ambulating without profound hypoxia or dyspnea otherwise stable and agreeable for discharge home, continue medications as outlined below with new inhaler Trelegy per pulmonology and change in diuretic dosing per cardiology.  Follow-up with pulmonology, cardiology and PCP in the next few weeks as scheduled.  Certainly if he has worsening respiratory symptoms, difficulty breathing, worsening ambulation or edema/fluid buildup please report back to the ED or call your PCP immediately for evaluation.  Discharge Diagnoses:  Active Problems:   Hypothyroidism   Dyslipidemia   Acute on chronic respiratory failure with hypoxia (HCC)   Chronic gout   COPD exacerbation (HCC)   Prolonged QT interval   RLS (restless legs syndrome)   Chest pain   Chronic pain   Acute exacerbation of congestive heart failure [facility])    Discharge Instructions  Discharge Instructions    Call MD for:  difficulty breathing, headache or visual disturbances   Complete by: As directed    Call MD for:  extreme fatigue   Complete by: As directed    Call MD for:  persistant dizziness or light-headedness   Complete by: As directed    Diet - low sodium heart healthy   Complete by: As directed    Increase activity slowly   Complete by: As directed      Allergies as of 05/13/2021      Reactions   Ativan [lorazepam] Other (See Comments)   Confusion    Pitavastatin Itching   Reaction to Livalo   Ropinirole Nausea Only, Other (See Comments)   Makes legs jump, also   Zofran [ondansetron Hcl] Nausea Only   Zolpidem Tartrate Anxiety, Other (See Comments)   CONFUSION    Penicillins Rash, Other (See Comments)   Tolerated cephalosporins in past Has patient had a PCN reaction causing immediate rash, facial/tongue/throat swelling, SOB or lightheadedness with hypotension: Yes Has patient had a PCN reaction causing severe rash  involving mucus membranes or skin necrosis: No Has patient had a PCN reaction that required hospitalization: No Has patient had a PCN reaction occurring within the last 10 years: No If all of the above answers are "NO", then may proceed with Cephalosporin use.      Medication List    STOP taking these medications   citalopram 40 MG tablet Commonly known as: CELEXA     TAKE these medications   allopurinol 100 MG tablet Commonly known as: ZYLOPRIM Take 100 mg by mouth daily with breakfast.   ARIPiprazole 2 MG tablet Commonly known as: ABILIFY Take 2 mg by mouth daily with breakfast.   aspirin EC 81 MG tablet Take 81 mg by mouth daily with breakfast. Swallow whole.   atorvastatin 20 MG tablet Commonly known as: LIPITOR Take 20 mg by mouth daily with supper.   cholecalciferol 25 MCG (1000 UNIT) tablet Commonly known as: VITAMIN D3 Take 1,000 Units by mouth daily with supper.   Combivent Respimat 20-100 MCG/ACT Aers respimat Generic drug: Ipratropium-Albuterol Inhale 1 puff into the lungs 3 (three) times daily as needed for wheezing or shortness of breath.   cyclobenzaprine 10 MG tablet Commonly known as: FLEXERIL Take 10 mg by mouth daily.   ferrous sulfate 325 (65 FE) MG tablet Take 325 mg by mouth daily with breakfast.   furosemide 80 MG tablet Commonly known as: LASIX Take 1 tablet (  80 mg total) by mouth daily. Start taking on: May 14, 2021 What changed:   medication strength  how much to take  additional instructions   gabapentin 100 MG capsule Commonly known as: NEURONTIN Take 100 mg by mouth daily with breakfast.   levothyroxine 50 MCG tablet Commonly known as: SYNTHROID Take 1 tablet (50 mcg total) by mouth daily at 6 (six) AM.   magnesium oxide 400 (241.3 Mg) MG tablet Commonly known as: MAG-OX Take 1 tablet (400 mg total) by mouth 2 (two) times daily.   Melatonin 10 MG Tabs Take 30 mg by mouth at bedtime.   nitroGLYCERIN 0.4 MG SL  tablet Commonly known as: NITROSTAT Place 1 tablet (0.4 mg total) under the tongue every 5 (five) minutes as needed for chest pain. For chest pain   nystatin 100000 UNIT/ML suspension Commonly known as: MYCOSTATIN Take 5 mLs (500,000 Units total) by mouth 4 (four) times daily.   omeprazole 40 MG capsule Commonly known as: PRILOSEC TAKE 1 CAPSULE BY MOUTH 2 TIMES DAILY.   OXYGEN Inhale 4 L/min into the lungs as needed (DURING ALL TIMES OF EXERTION).   pilocarpine 1 % ophthalmic solution Commonly known as: PILOCAR Place 1 drop into both eyes 2 (two) times daily.   potassium chloride SA 20 MEQ tablet Commonly known as: KLOR-CON Take 20 mEq by mouth daily with lunch.   pramipexole 0.25 MG tablet Commonly known as: MIRAPEX Take 0.25 mg by mouth daily with supper.   predniSONE 5 MG tablet Commonly known as: DELTASONE Label  & dispense according to the schedule below. take 8 Pills PO for 3 days, 6 Pills PO for 3 days, 4 Pills PO for 3 days, 2 Pills PO for 3 days, 1 Pills PO for 3 days, 1/2 Pill  PO for 3 days then STOP. Total 65 pills.   roflumilast 500 MCG Tabs tablet Commonly known as: DALIRESP Take 500 mcg by mouth daily with breakfast.   spironolactone 25 MG tablet Commonly known as: ALDACTONE Take 0.5 tablets (12.5 mg total) by mouth daily.   traZODone 50 MG tablet Commonly known as: DESYREL Take 150 mg by mouth at bedtime.   Trelegy Ellipta 100-62.5-25 MCG/INH Aepb Generic drug: Fluticasone-Umeclidin-Vilant Inhale 1 puff into the lungs daily.   vitamin C 500 MG tablet Commonly known as: ASCORBIC ACID Take 500 mg by mouth daily with lunch.   Xtampza ER 9 MG C12a Generic drug: oxyCODONE ER Take 9 mg by mouth 2 (two) times daily.            Durable Medical Equipment  (From admission, onward)         Start     Ordered   05/13/21 1227  DME Oxygen  Once       Question Answer Comment  Length of Need Lifetime   Mode or (Route) Nasal cannula   Liters per  Minute 4   Frequency Continuous (stationary and portable oxygen unit needed)   Oxygen delivery system Gas      05/13/21 1228          Follow-up Information    [name], MD. Go on 05/20/2021.   Specialty: Internal Medicine Why: Call to schedule hospital follow up appoint for 1-2 weeks Contact information: [place] [address] [phone]        Fay Records, MD Follow up.   Specialty: Cardiology Why: HHPT Contact information: [address] [facility] [address] [place] [address] [phone]  Care, [facility] Follow up.   Specialty: Home Health Services Why: HHPT, HHRN, HHAIDE Contact information: [address] [place] [address] [phone]              Allergies  Allergen Reactions  . Ativan [Lorazepam] Other (See Comments)    Confusion   . Pitavastatin Itching    Reaction to Livalo  . Ropinirole Nausea Only and Other (See Comments)    Makes legs jump, also  . Zofran [Ondansetron Hcl] Nausea Only  . Zolpidem Tartrate Anxiety and Other (See Comments)    CONFUSION   . Penicillins Rash and Other (See Comments)    Tolerated cephalosporins in past Has patient had a PCN reaction causing immediate rash, facial/tongue/throat swelling, SOB or lightheadedness with hypotension: Yes Has patient had a PCN reaction causing severe rash involving mucus membranes or skin necrosis: No Has patient had a PCN reaction that required hospitalization: No Has patient had a PCN reaction occurring within the last 10 years: No If all of the above answers are "NO", then may proceed with Cephalosporin use.     Consultations:  Cardiology, pulmonology   Procedures/Studies: DG Chest 2 View  Result Date: 05/10/2021 CLINICAL DATA:  Chest pain and shortness of breath. Bilateral leg swelling. EXAM: CHEST - 2 VIEW COMPARISON:  Chest x-ray dated mid tenth 2022. FINDINGS: Stable heart size status post CABG. Normal pulmonary  vascularity. The lungs remain mildly hyperinflated with chronic mild interstitial coarsening. No focal consolidation, pleural effusion, or pneumothorax. No acute osseous abnormality. IMPRESSION: 1.  No active cardiopulmonary disease. 2. COPD. Electronically Signed   By: Titus Dubin M.D.   On: 05/10/2021 16:14   CT Angio Chest PE W and/or Wo Contrast  Result Date: 05/10/2021 CLINICAL DATA:  Shortness of breath. Previous history of pulmonary embolism. EXAM: CT ANGIOGRAPHY CHEST WITH CONTRAST TECHNIQUE: Multidetector CT imaging of the chest was performed using the standard protocol during bolus administration of intravenous contrast. Multiplanar CT image reconstructions and MIPs were obtained to evaluate the vascular anatomy. CONTRAST:  80mL OMNIPAQUE IOHEXOL 350 MG/ML SOLN COMPARISON:  12/01/2008 FINDINGS: Cardiovascular: Satisfactory opacification of pulmonary arteries noted, and no pulmonary emboli identified. No evidence of thoracic aortic. Aortic and coronary atherosclerotic calcification noted. Stable mild cardiomegaly. Mediastinum/Nodes: No masses or pathologically enlarged lymph nodes identified. Lungs/Pleura: No pulmonary mass, infiltrate, or effusion. Stable biapical pleural-parenchymal scarring. Upper abdomen: No acute findings. Musculoskeletal: No suspicious bone lesions identified. Review of the MIP images confirms the above findings. IMPRESSION: No evidence of pulmonary embolism or other active disease. Aortic Atherosclerosis (ICD10-I70.0). Electronically Signed   By: Marlaine Hind M.D.   On: 05/10/2021 20:41   DG Chest Port 1 View  Result Date: 04/30/2021 CLINICAL DATA:  80 year old female with shortness of breath.  COPD. EXAM: PORTABLE CHEST 1 VIEW COMPARISON:  Portable chest 04/29/2021 and earlier. FINDINGS: Portable AP upright view at 0805 hours. Larger lung volumes. Mild increased pulmonary interstitial markings appear stable compared to February. No pneumothorax, pleural effusion or acute  pulmonary opacity. Incidental calcified breast implants. Stable cardiac size and mediastinal contours. Prior CABG. Postoperative changes to the right shoulder, cervical spine, and lumbar spine. Negative visible bowel gas. IMPRESSION: Improved ventilation from yesterday. No acute cardiopulmonary abnormality now. Electronically Signed   By: Genevie Ann M.D.   On: 04/30/2021 08:35   DG Chest Port 1 View  Result Date: 04/29/2021 CLINICAL DATA:  Shortness of breath and weight gain EXAM: PORTABLE CHEST 1 VIEW COMPARISON:  02/05/2021 FINDINGS: Cardiac  shadow is mildly prominent but stable. Postsurgical changes are again seen. Lungs are well aerated bilaterally. Increased vascular congestion is noted without significant edema. No focal infiltrate is noted. Degenerative changes of the thoracic spine are noted. IMPRESSION: Vascular congestion consistent with mild CHF. Electronically Signed   By: Inez Catalina M.D.   On: 04/29/2021 19:59   VAS [place] LOWER EXTREMITY VENOUS (DVT)  Result Date: 05/11/2021  Lower Venous DVT Study Patient Name:  [name]  Date of Exam:   05/11/2021 Medical Rec #: [id number]             Accession #:    [id number] Date of Birth: [birth year]              Patient Gender: F Patient Age:   79Y Exam Location:  [facility] Procedure:      VAS [place] LOWER EXTREMITY VENOUS (DVT) Referring Phys: [id number] [place] T TU --------------------------------------------------------------------------------  Indications: Swelling, and SOB.  Comparison Study: Prior study from 10/10/20 is available for comparison Performing Technologist: Sharion Dove RVS  Examination Guidelines: A complete evaluation includes B-mode imaging, spectral Doppler, color Doppler, and power Doppler as needed of all accessible portions of each vessel. Bilateral testing is considered an integral part of a complete examination. Limited examinations for reoccurring indications may be performed as noted. The reflux portion of the exam is  performed with the patient in reverse Trendelenburg.  +---------+---------------+---------+-----------+----------+--------------+ RIGHT    CompressibilityPhasicitySpontaneityPropertiesThrombus Aging +---------+---------------+---------+-----------+----------+--------------+ CFV      Full           Yes      Yes                                 +---------+---------------+---------+-----------+----------+--------------+ SFJ      Full                                                        +---------+---------------+---------+-----------+----------+--------------+ FV Prox  Full                                                        +---------+---------------+---------+-----------+----------+--------------+ FV Mid   Full                                                        +---------+---------------+---------+-----------+----------+--------------+ FV DistalFull                                                        +---------+---------------+---------+-----------+----------+--------------+ PFV      Full                                                        +---------+---------------+---------+-----------+----------+--------------+  POP      Full           Yes      Yes                                 +---------+---------------+---------+-----------+----------+--------------+ PTV      Full                                                        +---------+---------------+---------+-----------+----------+--------------+ PERO     Full                                                        +---------+---------------+---------+-----------+----------+--------------+   +---------+---------------+---------+-----------+----------+--------------+ LEFT     CompressibilityPhasicitySpontaneityPropertiesThrombus Aging +---------+---------------+---------+-----------+----------+--------------+ CFV      Full           Yes      Yes                                  +---------+---------------+---------+-----------+----------+--------------+ SFJ      Full                                                        +---------+---------------+---------+-----------+----------+--------------+ FV Prox  Full                                                        +---------+---------------+---------+-----------+----------+--------------+ FV Mid   Full                                                        +---------+---------------+---------+-----------+----------+--------------+ FV DistalFull                                                        +---------+---------------+---------+-----------+----------+--------------+ PFV      Full                                                        +---------+---------------+---------+-----------+----------+--------------+ POP      Full           Yes      Yes                                 +---------+---------------+---------+-----------+----------+--------------+  PTV      Full                                                        +---------+---------------+---------+-----------+----------+--------------+ PERO     Full                                                        +---------+---------------+---------+-----------+----------+--------------+     Summary: BILATERAL: - No evidence of deep vein thrombosis seen in the lower extremities, bilaterally. - RIGHT: - No cystic structure found in the popliteal fossa.  LEFT: - No cystic structure found in the popliteal fossa.  *See table(s) above for measurements and observations. Electronically signed by Deitra Mayo MD on 05/11/2021 at 3:30:32 PM.    Final      Subjective: No acute issues/events overnight    Discharge Exam: Vitals:   05/13/21 0835 05/13/21 1126  BP: (!) 130/58 (!) 127/51  Pulse: 90 90  Resp: 18 18  Temp: 98.1 F (36.7 C)   SpO2: 98% 97%   Vitals:   05/12/21 1939 05/13/21 0534 05/13/21 0835 05/13/21  1126  BP: (!) 143/53 119/61 (!) 130/58 (!) 127/51  Pulse: 94 83 90 90  Resp: 16 16 18 18   Temp: 98 F (36.7 C) 98.1 F (36.7 C) 98.1 F (36.7 C)   TempSrc: Oral Oral Oral   SpO2:  100% 98% 97%  Weight:  74 kg    Height:        General: Pt is alert, awake, not in acute distress Cardiovascular: RRR, S1/S2 +, no rubs, no gallops Respiratory: CTA bilaterally, no wheezing, no rhonchi Abdominal: Soft, NT, ND, bowel sounds + Extremities: no edema, no cyanosis    The results of significant diagnostics from this hospitalization (including imaging, microbiology, ancillary and laboratory) are listed below for reference.     Microbiology: Recent Results (from the past 240 hour(s))  Culture, blood (single)     Status: None (Preliminary result)   Collection Time: 05/10/21  3:20 PM   Specimen: BLOOD  Result Value Ref Range Status   Specimen Description BLOOD LEFT ANTECUBITAL  Final   Special Requests   Final    BOTTLES DRAWN AEROBIC AND ANAEROBIC Blood Culture adequate volume   Culture   Final    NO GROWTH 3 DAYS Performed at [facility], [address], [place], [place] [address]    Report Status PENDING  Incomplete  Resp Panel by RT-PCR (Flu A&B, Covid) Nasopharyngeal Swab     Status: None   Collection Time: 05/10/21  4:06 PM   Specimen: Nasopharyngeal Swab; Nasopharyngeal(NP) swabs in vial transport medium  Result Value Ref Range Status   SARS Coronavirus 2 by RT PCR NEGATIVE NEGATIVE Final    Comment: (NOTE) SARS-CoV-2 target nucleic acids are NOT DETECTED.  The SARS-CoV-2 RNA is generally detectable in upper respiratory specimens during the acute phase of infection. The lowest concentration of SARS-CoV-2 viral copies this assay can detect is 138 copies/mL. A negative result does not preclude SARS-Cov-2 infection and should not be used as the sole basis for treatment or other patient management decisions. A negative result may occur  with  improper specimen  collection/handling, submission of specimen other than nasopharyngeal swab, presence of viral mutation(s) within the areas targeted by this assay, and inadequate number of viral copies(<138 copies/mL). A negative result must be combined with clinical observations, patient history, and epidemiological information. The expected result is Negative.  Fact Sheet for Patients:  EntrepreneurPulse.com.au  Fact Sheet for Healthcare Providers:  IncredibleEmployment.be  This test is no t yet approved or cleared by the [place] FDA and  has been authorized for detection and/or diagnosis of SARS-CoV-2 by FDA under an Emergency Use Authorization (EUA). This EUA will remain  in effect (meaning this test can be used) for the duration of the COVID-19 declaration under Section 564(b)(1) of the Act, 21 U.S.C.section 360bbb-3(b)(1), unless the authorization is terminated  or revoked sooner.       Influenza A by PCR NEGATIVE NEGATIVE Final   Influenza B by PCR NEGATIVE NEGATIVE Final    Comment: (NOTE) The Xpert Xpress SARS-CoV-2/FLU/RSV plus assay is intended as an aid in the diagnosis of influenza from Nasopharyngeal swab specimens and should not be used as a sole basis for treatment. Nasal washings and aspirates are unacceptable for Xpert Xpress SARS-CoV-2/FLU/RSV testing.  Fact Sheet for Patients: EntrepreneurPulse.com.au  Fact Sheet for Healthcare Providers: IncredibleEmployment.be  This test is not yet approved or cleared by the [place] FDA and has been authorized for detection and/or diagnosis of SARS-CoV-2 by FDA under an Emergency Use Authorization (EUA). This EUA will remain in effect (meaning this test can be used) for the duration of the COVID-19 declaration under Section 564(b)(1) of the Act, 21 U.S.C. section 360bbb-3(b)(1), unless the authorization is terminated or revoked.  Performed at [facility], [place] [address], [place], [place] [address]      Labs: BNP (last 3 results) Recent Labs    05/01/21 0216 05/02/21 0147 05/10/21 1515  BNP 963.8* 679.6* [id number]   Basic Metabolic Panel: Recent Labs  Lab 05/10/21 1515 05/10/21 1541 05/11/21 1037 05/12/21 0324 05/13/21 0516 05/13/21 1131  NA 138  --  139 138 134*  --   K 4.6  --  4.2 3.8 4.8  --   CL 97*  --  97* 98 93*  --   CO2 35*  --  37* 32 35*  --   GLUCOSE 108*  --  94 138* 121*  --   BUN 30*  --  23 19 22   --   CREATININE 1.11*  --  0.95 0.96 1.19*  --   CALCIUM 8.0*  --  7.9* 7.6* 8.8*  --   MG  --  2.6*  --   --   --  2.0   Liver Function Tests: No results for input(s): AST, ALT, ALKPHOS, BILITOT, PROT, ALBUMIN in the last 168 hours. No results for input(s): LIPASE, AMYLASE in the last 168 hours. No results for input(s): AMMONIA in the last 168 hours. CBC: Recent Labs  Lab 05/10/21 1515 05/11/21 0224 05/12/21 0324  WBC 18.1* 14.5* 12.8*  NEUTROABS 14.6*  --   --   HGB 11.9* 11.5* 11.2*  HCT 39.4 38.2 36.5  MCV 90.2 90.3 89.5  PLT 248 240 226   Cardiac Enzymes: No results for input(s): CKTOTAL, CKMB, CKMBINDEX, TROPONINI in the last 168 hours. BNP: Invalid input(s): POCBNP CBG: No results for input(s): GLUCAP in the last 168 hours. D-Dimer Recent Labs    05/10/21 1741  DDIMER 3.23*   Hgb A1c No results for input(s): HGBA1C in  the last 72 hours. Lipid Profile Recent Labs    05/10/21 2305  CHOL 81  HDL 47  LDLCALC 30  TRIG 22  CHOLHDL 1.7   Thyroid function studies No results for input(s): TSH, T4TOTAL, T3FREE, THYROIDAB in the last 72 hours.  Invalid input(s): FREET3 Anemia work up No results for input(s): VITAMINB12, FOLATE, FERRITIN, TIBC, IRON, RETICCTPCT in the last 72 hours. Urinalysis    Component Value Date/Time   COLORURINE YELLOW 10/10/2020 1125   APPEARANCEUR CLEAR 10/10/2020 1125   LABSPEC 1.014 10/10/2020 1125   PHURINE 5.0 10/10/2020 1125   GLUCOSEU  NEGATIVE 10/10/2020 1125   HGBUR NEGATIVE 10/10/2020 1125   [place] 10/10/2020 1125   KETONESUR NEGATIVE 10/10/2020 1125   PROTEINUR NEGATIVE 10/10/2020 1125   UROBILINOGEN 1.0 03/30/2011 0901   NITRITE NEGATIVE 10/10/2020 1125   LEUKOCYTESUR NEGATIVE 10/10/2020 1125   Sepsis Labs Invalid input(s): PROCALCITONIN,  WBC,  LACTICIDVEN Microbiology Recent Results (from the past 240 hour(s))  Culture, blood (single)     Status: None (Preliminary result)   Collection Time: 05/10/21  3:20 PM   Specimen: BLOOD  Result Value Ref Range Status   Specimen Description BLOOD LEFT ANTECUBITAL  Final   Special Requests   Final    BOTTLES DRAWN AEROBIC AND ANAEROBIC Blood Culture adequate volume   Culture   Final    NO GROWTH 3 DAYS Performed at [facility], [place] [address] [place], [place] [address]    Report Status PENDING  Incomplete  Resp Panel by RT-PCR (Flu A&B, Covid) Nasopharyngeal Swab     Status: None   Collection Time: 05/10/21  4:06 PM   Specimen: Nasopharyngeal Swab; Nasopharyngeal(NP) swabs in vial transport medium  Result Value Ref Range Status   SARS Coronavirus 2 by RT PCR NEGATIVE NEGATIVE Final    Comment: (NOTE) SARS-CoV-2 target nucleic acids are NOT DETECTED.  The SARS-CoV-2 RNA is generally detectable in upper respiratory specimens during the acute phase of infection. The lowest concentration of SARS-CoV-2 viral copies this assay can detect is 138 copies/mL. A negative result does not preclude SARS-Cov-2 infection and should not be used as the sole basis for treatment or other patient management decisions. A negative result may occur with  improper specimen collection/handling, submission of specimen other than nasopharyngeal swab, presence of viral mutation(s) within the areas targeted by this assay, and inadequate number of viral copies(<138 copies/mL). A negative result must be combined with clinical observations, patient history, and  epidemiological information. The expected result is Negative.  Fact Sheet for Patients:  EntrepreneurPulse.com.au  Fact Sheet for Healthcare Providers:  IncredibleEmployment.be  This test is no t yet approved or cleared by the [place] FDA and  has been authorized for detection and/or diagnosis of SARS-CoV-2 by FDA under an Emergency Use Authorization (EUA). This EUA will remain  in effect (meaning this test can be used) for the duration of the COVID-19 declaration under Section 564(b)(1) of the Act, 21 U.S.C.section 360bbb-3(b)(1), unless the authorization is terminated  or revoked sooner.       Influenza A by PCR NEGATIVE NEGATIVE Final   Influenza B by PCR NEGATIVE NEGATIVE Final    Comment: (NOTE) The Xpert Xpress SARS-CoV-2/FLU/RSV plus assay is intended as an aid in the diagnosis of influenza from Nasopharyngeal swab specimens and should not be used as a sole basis for treatment. Nasal washings and aspirates are unacceptable for Xpert Xpress SARS-CoV-2/FLU/RSV testing.  Fact Sheet for Patients: EntrepreneurPulse.com.au  Fact Sheet for Healthcare  Providers: IncredibleEmployment.be  This test is not yet approved or cleared by the [place] and has been authorized for detection and/or diagnosis of SARS-CoV-2 by FDA under an Emergency Use Authorization (EUA). This EUA will remain in effect (meaning this test can be used) for the duration of the COVID-19 declaration under Section 564(b)(1) of the Act, 21 U.S.C. section 360bbb-3(b)(1), unless the authorization is terminated or revoked.  Performed at [facility], [place] [address] [place], [place] [address]      Time coordinating discharge: Over 30 minutes  SIGNED:   Little Ishikawa, DO [facility] 05/13/2021, 5:25 PM Pager   If 7PM-7AM, please contact night-coverage [email or web address]

## 2021-05-13 NOTE — Progress Notes (Signed)
D/C instructions given and reviewed. Tele and IV removed, tolerated well. Requested this nurse to call sister for transport and to see if she could bring her O2 from home for transport. Sister stated she would transport but didn't have her O2 and would call this nurse back about O2.

## 2021-05-13 NOTE — Progress Notes (Signed)
Patient on 4L [place].  Patient wears 4L [place] at home.  Patient's o2 on 4L [place] was 96%.  Patient ambulated a total distance of 100 feet.  Patient's o2 level was 95% during her walk.  Patient was returned to her room after ambulating.  Patient's o2 level post ambulation is 97% on 4L [place].  MD notified.

## 2021-05-13 NOTE — Plan of Care (Signed)
  Problem: Clinical Measurements: Goal: Ability to maintain clinical measurements within normal limits will improve Outcome: Progressing Goal: Will remain free from infection Outcome: Progressing Goal: Diagnostic test results will improve Outcome: Progressing Goal: Respiratory complications will improve Outcome: Progressing Goal: Cardiovascular complication will be avoided Outcome: Progressing  Problem: Activity: Goal: Risk for activity intolerance will decrease Outcome: Progressing   Problem: Skin Integrity: Goal: Risk for impaired skin integrity will decrease Outcome: Progressing     

## 2021-05-13 NOTE — Progress Notes (Signed)
DAILY PROGRESS NOTE   Patient Name: [name] Date of Encounter: 05/13/2021 Cardiologist: Dorris Carnes, MD  Chief Complaint   No complaints  Patient Profile   [name] is a 80 y.o. female with a hx of CAD (s/p CABG 2004; DES to LM in 2010; myovue in 2018: Low risk with distal anterior and apical ischemia), chronic diastolic CHF, CV dz, PAD recurrent DVT/PE, GI bleeding on anticoagulation COPD, PAF (after CABG), GERD, HL, HTN, renal insufficiency, sleep apnea (not on CPAP)  who is being seen 05/11/2021 for the evaluation of CHF  at the request of Dr. Avon Gully.  Subjective   Net negative 2.3L overnight - transitioned to oral lasix 80 mg BID.  Creatinine has bumped with this today to 1.19 (from 0.96). Potassium 4.8.  Objective   Vitals:   05/12/21 1939 05/12/21 2059 05/13/21 0534 05/13/21 0835  BP: (!) 143/53  119/61 (!) 130/58  Pulse: 94  83 90  Resp: 16  16 18   Temp: 98 F (36.7 C)  98.1 F (36.7 C) 98.1 F (36.7 C)  TempSrc: Oral  Oral Oral  SpO2: 100% 94% 100% 98%  Weight:   74 kg   Height:        Intake/Output Summary (Last 24 hours) at 05/13/2021 0954 Last data filed at 05/13/2021 0500 Gross per 24 hour  Intake 916 ml  Output 2900 ml  Net -1984 ml   Filed Weights   05/11/21 0530 05/12/21 0434 05/13/21 0534  Weight: 77.1 kg 75.8 kg 74 kg    Physical Exam   General appearance: alert and no distress Lungs: diminished breath sounds bilaterally and wheezes bilaterally Heart: regular rate and rhythm Extremities: extremities normal, atraumatic, no cyanosis or edema Neurologic: Grossly normal  Inpatient Medications    Scheduled Meds: . allopurinol  100 mg Oral Q breakfast  . vitamin C  500 mg Oral Q lunch  . aspirin EC  81 mg Oral Q breakfast  . atorvastatin  20 mg Oral Q supper  . cholecalciferol  1,000 Units Oral Q supper  . cyclobenzaprine  10 mg Oral Daily  . ferrous sulfate  325 mg Oral Q breakfast  . furosemide  80 mg Oral  BID  . gabapentin  100 mg Oral Q breakfast  . heparin injection (subcutaneous)  5,000 Units Subcutaneous Q8H  . ipratropium-albuterol  3 mL Nebulization BID  . levothyroxine  50 mcg Oral Q0600  . magnesium oxide  400 mg Oral BID  . melatonin  10 mg Oral QHS  . nystatin  5 mL Oral QID  . oxyCODONE  10 mg Oral BID  . pantoprazole  40 mg Oral Daily  . potassium chloride SA  20 mEq Oral Q lunch  . pramipexole  0.25 mg Oral Q supper  . roflumilast  500 mcg Oral Q breakfast  . spironolactone  12.5 mg Oral Daily  . traZODone  150 mg Oral QHS    Continuous Infusions:   PRN Meds: acetaminophen, ipratropium-albuterol, magic mouthwash w/lidocaine, nitroGLYCERIN   Labs   Results for orders placed or performed during the hospital encounter of 05/10/21 (from the past 48 hour(s))  Heparin level (unfractionated)     Status: Abnormal   Collection Time: 05/11/21 10:37 AM  Result Value Ref Range   Heparin Unfractionated 0.24 (L) 0.30 - 0.70 IU/mL    Comment: (NOTE) The clinical reportable range upper limit is being lowered to >1.10 to align with the FDA approved guidance for the current laboratory assay.  If heparin results are below expected values, and patient dosage has  been confirmed, suggest follow up testing of antithrombin III levels. Performed at [facility], [place] [address], [place], [place] [address]   Basic metabolic panel     Status: Abnormal   Collection Time: 05/11/21 10:37 AM  Result Value Ref Range   Sodium 139 135 - 145 mmol/L   Potassium 4.2 3.5 - 5.1 mmol/L   Chloride 97 (L) 98 - 111 mmol/L   CO2 37 (H) 22 - 32 mmol/L   Glucose, Bld 94 70 - 99 mg/dL    Comment: Glucose reference range applies only to samples taken after fasting for at least 8 hours.   BUN 23 8 - 23 mg/dL   Creatinine, Ser 0.95 0.44 - 1.00 mg/dL   Calcium 7.9 (L) 8.9 - 10.3 mg/dL   GFR, Estimated >60 >60 mL/min    Comment: (NOTE) Calculated using the CKD-EPI Creatinine Equation (2021)     Anion gap 5 5 - 15    Comment: Performed at [place] [address], [place], [place] [address]  Heparin level (unfractionated)     Status: Abnormal   Collection Time: 05/11/21  6:02 PM  Result Value Ref Range   Heparin Unfractionated 0.82 (H) 0.30 - 0.70 IU/mL    Comment: (NOTE) The clinical reportable range upper limit is being lowered to >1.10 to align with the FDA approved guidance for the current laboratory assay.  If heparin results are below expected values, and patient dosage has  been confirmed, suggest follow up testing of antithrombin III levels. Performed at [facility], [place] [address] [place], [place] [address]   Heparin level (unfractionated)     Status: None   Collection Time: 05/12/21  3:24 AM  Result Value Ref Range   Heparin Unfractionated 0.65 0.30 - 0.70 IU/mL    Comment: (NOTE) The clinical reportable range upper limit is being lowered to >1.10 to align with the FDA approved guidance for the current laboratory assay.  If heparin results are below expected values, and patient dosage has  been confirmed, suggest follow up testing of antithrombin III levels. Performed at [facility], [place] [address], [place], [place] [address]   CBC     Status: Abnormal   Collection Time: 05/12/21  3:24 AM  Result Value Ref Range   WBC 12.8 (H) 4.0 - 10.5 K/uL   RBC 4.08 3.87 - 5.11 MIL/uL   Hemoglobin 11.2 (L) 12.0 - 15.0 g/dL   HCT 36.5 36.0 - 46.0 %   MCV 89.5 80.0 - 100.0 fL   MCH 27.5 26.0 - 34.0 pg   MCHC 30.7 30.0 - 36.0 g/dL   RDW 16.4 (H) 11.5 - 15.5 %   Platelets 226 150 - 400 K/uL   nRBC 0.0 0.0 - 0.2 %    Comment: Performed at [facility], [place] [address], [place], [place] [address]  Basic metabolic panel     Status: Abnormal   Collection Time: 05/12/21  3:24 AM  Result Value Ref Range   Sodium 138 135 - 145 mmol/L   Potassium 3.8 3.5 - 5.1 mmol/L   Chloride 98 98 - 111 mmol/L   CO2 32 22 - 32 mmol/L   Glucose, Bld 138 (H)  70 - 99 mg/dL    Comment: Glucose reference range applies only to samples taken after fasting for at least 8 hours.   BUN 19 8 - 23  mg/dL   Creatinine, Ser 0.96 0.44 - 1.00 mg/dL   Calcium 7.6 (L) 8.9 - 10.3 mg/dL   GFR, Estimated 60 (L) >60 mL/min    Comment: (NOTE) Calculated using the CKD-EPI Creatinine Equation (2021)    Anion gap 8 5 - 15    Comment: Performed at [place] [address] [place], [place] [address]  Basic metabolic panel     Status: Abnormal   Collection Time: 05/13/21  5:16 AM  Result Value Ref Range   Sodium 134 (L) 135 - 145 mmol/L   Potassium 4.8 3.5 - 5.1 mmol/L   Chloride 93 (L) 98 - 111 mmol/L   CO2 35 (H) 22 - 32 mmol/L   Glucose, Bld 121 (H) 70 - 99 mg/dL    Comment: Glucose reference range applies only to samples taken after fasting for at least 8 hours.   BUN 22 8 - 23 mg/dL   Creatinine, Ser 1.19 (H) 0.44 - 1.00 mg/dL   Calcium 8.8 (L) 8.9 - 10.3 mg/dL   GFR, Estimated 46 (L) >60 mL/min    Comment: (NOTE) Calculated using the CKD-EPI Creatinine Equation (2021)    Anion gap 6 5 - 15    Comment: Performed at [facility] [address] [place], [place] [address]    ECG   N/A  Telemetry   Sinus rhythm - Personally Reviewed  Radiology    No results found.  Cardiac Studies   See above  Assessment   Active Problems:   Hypothyroidism   Dyslipidemia   Acute on chronic respiratory failure with hypoxia (HCC)   Chronic gout   COPD exacerbation (HCC)   Prolonged QT interval   RLS (restless legs syndrome)   Chest pain   Chronic pain   Acute exacerbation of congestive heart failure ([place])   Plan   1. Decrease lasix to 80 mg po daily -monitor creatinine and potassium. D/c potassium supplement - on low dose aldactone. No further cardiac suggestions at this time.  [facility] will sign off.   Medication Recommendations:  As above Other recommendations (labs, testing, etc):  none Follow up as an outpatient:  Dr.  Harrington Challenger   Time Spent Directly with Patient:  I have spent a total of 25 minutes with the patient reviewing hospital notes, telemetry, EKGs, labs and examining the patient as well as establishing an assessment and plan that was discussed personally with the patient.  > 50% of time was spent in direct patient care.  Length of Stay:  LOS: 2 days   Pixie Casino, MD, [facility], [place] Director of the Advanced Lipid Disorders &  Cardiovascular Risk Reduction Clinic Diplomate of the American Board of Clinical Lipidology Attending Cardiologist  Direct Dial: [phone]  Fax: [phone]  Website:  [email or web address] 05/13/2021, 9:54 AM

## 2021-05-13 NOTE — TOC Transition Note (Signed)
Transition of Care [facility]) - CM/SW Discharge Note   Patient Details  Name: [name] MRN: [id number] Date of Birth: [birth year]  [facility] of [facility] CM/SW Contact:  Zenon Mayo, RN Phone Number: 05/13/2021, 1:21 PM   Clinical Narrative:    Patient is for dc today, she is set up with [facility] for [facility], [place], [place].  NCM notified Cyndie with [facility] regarding discharge today. Patient has home oxygen with [facility] .  NCM notified [facility] that there is new order in for 4 liters now.  They are able to go into epic to get the order.     Final next level of care: [place] Barriers to Discharge: No Barriers Identified   Patient Goals and CMS Choice Patient states their goals for this hospitalization and ongoing recovery are:: return home CMS Medicare.gov Compare Post Acute Care list provided to:: Patient Represenative (must comment) Choice offered to / list presented to : Adult Children  Discharge Placement                       Discharge Plan and Services                  DME Agency: NA       HH Arranged: PT,RN,Nurse's Aide HH Agency: [place] Date [facility] Agency Contacted: 05/03/21 Time [place]: [phone] Representative spoke with at [place]: cory  Social Determinants of Health ([place]) Interventions     Readmission Risk Interventions Readmission Risk Prevention Plan 05/13/2021  Transportation Screening Complete  Medication Review Press photographer) Complete  HRI or [place] Complete  SW Recovery Care/Counseling Consult Complete  Palliative Care Screening Not Central Not Applicable  Some recent data might be hidden

## 2021-05-13 NOTE — Plan of Care (Signed)
  Problem: Safety: Goal: Ability to remain free from injury will improve Outcome: Completed/Met   Problem: Pain Managment: Goal: General experience of comfort will improve Outcome: Completed/Met   Problem: Elimination: Goal: Will not experience complications related to urinary retention Outcome: Completed/Met   Problem: Elimination: Goal: Will not experience complications related to bowel motility Outcome: Completed/Met   Problem: Coping: Goal: Level of anxiety will decrease Outcome: Completed/Met   Problem: Nutrition: Goal: Adequate nutrition will be maintained Outcome: Completed/Met   

## 2021-05-13 NOTE — Consult Note (Signed)
NAME:  [name], MRN:  [id number], DOB:  10-27-1941, LOS: 2 ADMISSION DATE:  05/10/2021, CONSULTATION DATE:  5/23 REFERRING MD:  Avon Gully, CHIEF COMPLAINT:  Dyspnea   History of Present Illness:  80 y/o female admitted with acute on chronic hypoxemic respiratory failure due to CHF exacerbation.   She had recently been hospitalized from 5/9-5/14 for a CHF and COPD exacerbation causing worsening hypoxemia.  She was diuresed and discharged on a prednisone taper.  She presented again with a chief complaint of dyspnea on 5/20.  She had cough productive of yellow sputum, denied fevers or chills.    She says that since she came here she is feeling better. She is receiving treatment with prednisone (continued from prior admit) and has been receiving diuresis. She now complains of some cramping in her L chest.  She had a CT angiogram which was negative for PE on 5/20.  She is feeling much better now.  Wants to go home.    She says at home she takes nebulized medicines and her medications with assistance from her granddaughter.  She has been on inhalers in the past, but doesn't think she is taking any now.  She previously saw Dr. Chase Caller in 2009.    Pertinent  Medical History  CAD, s/p CABG, DES to LM in 2010; [place] 2012 EF 50-55%, mild elevated filling pressures, no pulmonary HTN, LM 90% ISR, LAD and CFX occluded, S-RI occluded (old), S-OM3 ok and L-LAD ok, native nondominant RCA 78% Chronic diastolic heart failure COPD, severe, 2018 PFT> Ratio 50%, FEV 1.01 L 43% pred, TLC 78% pred, DLCO 16.04, 56% pred GERD Hyperlipicemia Hypertension Hyperthryoidism Chronic respiratory failure with hypoxemia on 4L [place]  Significant Hospital Events: Including procedures, antibiotic start and stop dates in addition to other pertinent events   . 5/20 admitted for acute on chronic respiratory failue with hypoxemia . 5/23 PCCM consult: dyspnea, cough  Interim History / Subjective:  As above  Objective    Blood pressure (!) 130/58, pulse 90, temperature 98.1 F (36.7 C), temperature source Oral, resp. rate 18, height 5\' 8"  (1.727 m), weight 74 kg, SpO2 98 %.        Intake/Output Summary (Last 24 hours) at 05/13/2021 1038 Last data filed at 05/13/2021 0500 Gross per 24 hour  Intake 916 ml  Output 2900 ml  Net -1984 ml   Filed Weights   05/11/21 0530 05/12/21 0434 05/13/21 0534  Weight: 77.1 kg 75.8 kg 74 kg    Examination: General:  Frail female, resting comfortably in bed HENT: NCAT OP clear PULM: Scant crackles bases, otherwise clear B, normal effort CV: RRR, systolic murmur noted GI: BS+, soft, nontender MSK: normal bulk and tone Neuro: awake, alert, no distress, MAEW   Labs/imaging that I havepersonally reviewed  (right click and "Reselect all SmartList Selections" daily)  5/20 CT angiogram chest > apical capping type scarring bilaterally, otherwise normal appearing pulmonary parenchyma, has some bronchomalacia in right mainstem/bronchus intermedius, no emphysema  Resolved Hospital Problem list     Assessment & Plan:   COPD, severe with recurrent exacerbations and hospitalization.  High predicted mortality in this situation, but of note she is not currently taking the best preventative regimen with her home medications > start trelegy daily > change duoneb to prn > continue roflumilast > f/u with pulmonary medicine as outpatient, our office will call her > consider starting daily azithromycin (will need to monitor QTc) > if recurrence of COPD exacerbation in next few months  despite adequate medical regimen then would consider palliative care consultation/hospice  CHF exacerbation > diuresis per cardiology > continue lisinopril (no real reason to change it as cough is not a problem for her) > we would be OK with b-blocker if felt appropriate for this patient  Chronic respiratory failure with hypoxemia > Continue 4 O2 Lpm as ordered now at baseline (home dose) >  Home O2 evaluation prior to discharge  Physical deconditioning > consider pulmonary or cardiac rehab  Best practice (right click and "Reselect all SmartList Selections" daily)   Per primary  Labs   CBC: Recent Labs  Lab 05/10/21 1515 05/11/21 0224 05/12/21 0324  WBC 18.1* 14.5* 12.8*  NEUTROABS 14.6*  --   --   HGB 11.9* 11.5* 11.2*  HCT 39.4 38.2 36.5  MCV 90.2 90.3 89.5  PLT 248 240 824    Basic Metabolic Panel: Recent Labs  Lab 05/10/21 1515 05/10/21 1541 05/11/21 1037 05/12/21 0324 05/13/21 0516  NA 138  --  139 138 134*  K 4.6  --  4.2 3.8 4.8  CL 97*  --  97* 98 93*  CO2 35*  --  37* 32 35*  GLUCOSE 108*  --  94 138* 121*  BUN 30*  --  23 19 22   CREATININE 1.11*  --  0.95 0.96 1.19*  CALCIUM 8.0*  --  7.9* 7.6* 8.8*  MG  --  2.6*  --   --   --    GFR: Estimated Creatinine Clearance: 38 mL/min (A) (by C-G formula based on SCr of 1.19 mg/dL (H)). Recent Labs  Lab 05/10/21 1515 05/10/21 1715 05/11/21 0224 05/12/21 0324  WBC 18.1*  --  14.5* 12.8*  LATICACIDVEN 1.3 1.7  --   --     Liver Function Tests: No results for input(s): AST, ALT, ALKPHOS, BILITOT, PROT, ALBUMIN in the last 168 hours. No results for input(s): LIPASE, AMYLASE in the last 168 hours. No results for input(s): AMMONIA in the last 168 hours.  ABG    Component Value Date/Time   PHART 7.374 03/14/2018 0127   PCO2ART 29.2 (L) 03/14/2018 0127   PO2ART 139 (H) 03/14/2018 0127   HCO3 16.7 (L) 03/14/2018 0127   TCO2 30 10/29/2011 0925   ACIDBASEDEF 7.6 (H) 03/14/2018 0127   O2SAT 98.8 03/14/2018 0127     Coagulation Profile: No results for input(s): INR, PROTIME in the last 168 hours.  Cardiac Enzymes: No results for input(s): CKTOTAL, CKMB, CKMBINDEX, TROPONINI in the last 168 hours.  HbA1C: No results found for: HGBA1C  CBG: No results for input(s): GLUCAP in the last 168 hours.  Review of Systems:   Gen: Denies fever, chills, weight change, fatigue, night  sweats HEENT: Denies blurred vision, double vision, hearing loss, tinnitus, sinus congestion, rhinorrhea, sore throat, neck stiffness, dysphagia PULM: per HPI CV: Denies chest pain, edema, orthopnea, paroxysmal nocturnal dyspnea, palpitations GI: Denies abdominal pain, nausea, vomiting, diarrhea, hematochezia, melena, constipation, change in bowel habits GU: Denies dysuria, hematuria, polyuria, oliguria, urethral discharge Endocrine: Denies hot or cold intolerance, polyuria, polyphagia or appetite change Derm: Denies rash, dry skin, scaling or peeling skin change Heme: Denies easy bruising, bleeding, bleeding gums Neuro: Denies headache, numbness, weakness, slurred speech, loss of memory or consciousness   Past Medical History:  She,  has a past medical history of Anemia, Anxiety, Arthralgia, Blood transfusion (2012; 02/2018), CAD (coronary artery disease), Carotid artery disease ([place]), CHF (congestive heart failure) ([place]) (01/21/2020), Chronic diastolic heart failure ([place]), Chronic lower  back pain, COPD (chronic obstructive pulmonary disease) ([place]), Depression, Diuretic-induced hypokalemia (10/08/2018), DVT of lower extremity (deep venous thrombosis) ([place]), Dyspnea, Dysrhythmia, Exertional angina ([place]), GERD (gastroesophageal reflux disease), Gout, HLD (hyperlipidemia), Hypertension, Hyperthyroidism, L Subclavian Artery Stenosis, Obesity (BMI 30-39.9) (2009), Osteoarthritis, Oxygen deficiency, Oxygen dependent, Pneumonia, PVD (peripheral vascular disease) ([place]), and Sleep apnea (2012).   Surgical History:   Past Surgical History:  Procedure Laterality Date  . ANKLE SURGERY  2004  . ANTERIOR CERVICAL DECOMP/DISCECTOMY FUSION  11/2007  . AUGMENTATION MAMMAPLASTY    . [place]   lower; another scheduled, opt. reports 4 back- lumbar, 3 cerv. fusions  for later 2009  . CARDIAC CATHETERIZATION  11/2009   Patent LIMA to LAD and patent SVG to OM1. Occluded SVG to ramus and diagonal. Left  main: 90% ostial stenosis, LCX 60-70% proximal stenosis  . CATARACT EXTRACTION W/ INTRAOCULAR LENS  IMPLANT, BILATERAL Bilateral 2006  . COLONOSCOPY WITH PROPOFOL N/A 02/17/2017   Procedure: COLONOSCOPY WITH PROPOFOL;  Surgeon: Jerene Bears, MD;  [facility];  Service: Endoscopy;  Laterality: N/A;  . CORONARY ANGIOPLASTY WITH STENT PLACEMENT  11/2009   Drug eluting stent to left main artery: 4.0 X 12 mm Ion   . CORONARY ARTERY BYPASS GRAFT  2004   "CABG X4"  . ESOPHAGOGASTRODUODENOSCOPY N/A 02/14/2017   Procedure: ESOPHAGOGASTRODUODENOSCOPY (EGD);  Surgeon: Doran Stabler, MD;  Location: [facility] ENDOSCOPY;  Service: Endoscopy;  Laterality: N/A;  . GIVENS CAPSULE STUDY N/A 02/15/2017   Procedure: GIVENS CAPSULE STUDY;  Surgeon: Doran Stabler, MD;  Location: [place];  Service: Endoscopy;  Laterality: N/A;  . GROIN MASS OPEN BIOPSY  2004  . IR IVC FILTER PLMT / S&I /IMG GUID/MOD SED  03/14/2018  . IR PARACENTESIS  03/10/2018  . JOINT REPLACEMENT    . LAPAROSCOPIC CHOLECYSTECTOMY    . PERIPHERAL VASCULAR INTERVENTION Left 03/05/2018   Procedure: PERIPHERAL VASCULAR INTERVENTION;  Surgeon: Elam Dutch, MD;  Location: [place] CV LAB;  Service: Cardiovascular;  Laterality: Left;  Attempted unsuccess\ful Per Dr. Eden Lathe  . PERIPHERAL VASCULAR INTERVENTION  03/08/2018   Procedure: PERIPHERAL VASCULAR INTERVENTION;  Surgeon: Waynetta Sandy, MD;  Location: [place] CV LAB;  Service: Cardiovascular;;  SMA Stent   . REPLACEMENT TOTAL KNEE BILATERAL Bilateral 2012  . REVERSE SHOULDER ARTHROPLASTY  02/26/2012   Procedure: REVERSE SHOULDER ARTHROPLASTY;  Surgeon: Marin Shutter, MD;  Location: [place];  Service: Orthopedics;  Laterality: Right;  RIGHT SHOULDER REVERSED ARTHROPLASTY  . SHOULDER ARTHROSCOPY WITH SUBACROMIAL DECOMPRESSION Left 02/10/2013   Procedure: SHOULDER ARTHROSCOPY WITH SUBACROMIAL DECOMPRESSION DISTAL CLAVICLE RESECTION;  Surgeon: Marin Shutter, MD;   Location: [place];  Service: Orthopedics;  Laterality: Left;  DISTAL CLAVICLE RESECTION  . TONSILLECTOMY    . TOTAL HIP ARTHROPLASTY  2007   Right  . TOTAL HIP ARTHROPLASTY Left 02/07/2020   Procedure: TOTAL HIP ARTHROPLASTY ANTERIOR APPROACH;  Surgeon: Paralee Cancel, MD;  Location: [facility];  Service: Orthopedics;  Laterality: Left;  70 mins  . TRANSURETHRAL RESECTION OF BLADDER TUMOR N/A 05/31/2018   Procedure: TRANSURETHRAL RESECTION OF BLADDER TUMOR (TURBT);  Surgeon: Lucas Mallow, MD;  Location: [facility];  Service: Urology;  Laterality: N/A;  . VISCERAL ANGIOGRAPHY N/A 03/05/2018   Procedure: VISCERAL ANGIOGRAPHY;  Surgeon: Elam Dutch, MD;  Location: [place] CV [facility];  Service: Cardiovascular;  Laterality: N/A;  . VISCERAL ANGIOGRAPHY N/A 03/08/2018   Procedure: VISCERAL ANGIOGRAPHY;  Surgeon: Waynetta Sandy, MD;  Location: [place] CV [facility];  Service: Cardiovascular;  Laterality: N/A;     Social History:   reports that she quit smoking about 20 years ago. Her smoking use included cigarettes. She has a 45.00 pack-year smoking history. She has never used smokeless tobacco. She reports that she does not drink alcohol and does not use drugs.   Family History:  Her family history includes COPD in her father; Colitis in her sister; Heart disease in her father and sister; Lung cancer in her father; Ovarian cancer in her mother; Stomach cancer in her sister.   Allergies Allergies  Allergen Reactions  . Ativan [Lorazepam] Other (See Comments)    Confusion   . Pitavastatin Itching    Reaction to Livalo  . Ropinirole Nausea Only and Other (See Comments)    Makes legs jump, also  . Zofran [Ondansetron Hcl] Nausea Only  . Zolpidem Tartrate Anxiety and Other (See Comments)    CONFUSION   . Penicillins Rash and Other (See Comments)    Tolerated cephalosporins in past Has patient had a PCN reaction causing immediate rash, facial/tongue/throat swelling, SOB or lightheadedness  with hypotension: Yes Has patient had a PCN reaction causing severe rash involving mucus membranes or skin necrosis: No Has patient had a PCN reaction that required hospitalization: No Has patient had a PCN reaction occurring within the last 10 years: No If all of the above answers are "NO", then may proceed with Cephalosporin use.      Home Medications  Prior to Admission medications   Medication Sig Start Date End Date Taking? Authorizing Provider  allopurinol (ZYLOPRIM) 100 MG tablet Take 100 mg by mouth daily with breakfast. 01/30/17  Yes [provider]  ARIPiprazole (ABILIFY) 2 MG tablet Take 2 mg by mouth daily with breakfast. 12/29/19  Yes [provider]  aspirin EC 81 MG tablet Take 81 mg by mouth daily with breakfast. Swallow whole.   Yes [provider]  atorvastatin (LIPITOR) 20 MG tablet Take 20 mg by mouth daily with supper. 09/21/20  Yes [provider]  cholecalciferol (VITAMIN D3) 25 MCG (1000 UNIT) tablet Take 1,000 Units by mouth daily with supper.   Yes [provider]  citalopram (CELEXA) 40 MG tablet Take 40 mg by mouth daily with supper. 08/30/19  Yes [provider]  cyclobenzaprine (FLEXERIL) 10 MG tablet Take 10 mg by mouth daily.   Yes [provider]  ferrous sulfate 325 (65 FE) MG tablet Take 325 mg by mouth daily with breakfast.   Yes [provider]  furosemide (LASIX) 40 MG tablet Take 120 mg by mouth daily. 80 mg in the morning 40 mg in the evening   Yes [provider]  gabapentin (NEURONTIN) 100 MG capsule Take 100 mg by mouth daily with breakfast. 01/09/20  Yes [provider]  Ipratropium-Albuterol (COMBIVENT RESPIMAT) 20-100 MCG/ACT AERS respimat Inhale 1 puff into the lungs 3 (three) times daily as needed for wheezing or shortness of breath.   Yes [provider]  levothyroxine (SYNTHROID) 50 MCG tablet Take 1 tablet (50 mcg total) by mouth daily at 6 (six) AM.  09/13/20  Yes Fay Records, MD  magnesium oxide (MAG-OX) 400 (241.3 Mg) MG tablet Take 1 tablet (400 mg total) by mouth 2 (two) times daily. 03/26/18  Yes Mikhail, Velta Addison, DO  Melatonin 10 MG TABS Take 30 mg by mouth at bedtime.   Yes [provider]  nitroGLYCERIN (NITROSTAT) 0.4 MG SL tablet Place 1 tablet (  0.4 mg total) under the tongue every 5 (five) minutes as needed for chest pain. For chest pain 04/24/16  Yes Fay Records, MD  omeprazole (PRILOSEC) 40 MG capsule TAKE 1 CAPSULE BY MOUTH 2 TIMES DAILY. 12/20/20  Yes Esterwood, Amy S, PA-C  OXYGEN Inhale 4 L/min into the lungs as needed (DURING ALL TIMES OF EXERTION).    Yes [provider]  pilocarpine (PILOCAR) 1 % ophthalmic solution Place 1 drop into both eyes 2 (two) times daily.   Yes [provider]  potassium chloride SA (KLOR-CON) 20 MEQ tablet Take 20 mEq by mouth daily with lunch.   Yes [provider]  pramipexole (MIRAPEX) 0.25 MG tablet Take 0.25 mg by mouth daily with supper. 08/22/20  Yes [provider]  predniSONE (DELTASONE) 5 MG tablet Label  & dispense according to the schedule below. take 8 Pills PO for 3 days, 6 Pills PO for 3 days, 4 Pills PO for 3 days, 2 Pills PO for 3 days, 1 Pills PO for 3 days, 1/2 Pill  PO for 3 days then STOP. Total 65 pills. 05/04/21  Yes Thurnell Lose, MD  roflumilast (DALIRESP) 500 MCG TABS tablet Take 500 mcg by mouth daily with breakfast.   Yes [provider]  spironolactone (ALDACTONE) 25 MG tablet Take 0.5 tablets (12.5 mg total) by mouth daily. 02/12/21  Yes Katherine Roan, MD  traZODone (DESYREL) 50 MG tablet Take 150 mg by mouth at bedtime.  10/07/18  Yes [provider]  vitamin C (ASCORBIC ACID) 500 MG tablet Take 500 mg by mouth daily with lunch.   Yes [provider]  XTAMPZA ER 9 MG C12A Take 9 mg by mouth 2 (two) times daily. 09/25/20  Yes [provider]     Critical care time: n/a     Roselie Awkward, MD [place] P[facility]M Pager: [phone] Cell: [phone] If no response, please call [phone] until 7pm After 7:00 pm call [place]  [phone]

## 2021-05-15 ENCOUNTER — Other Ambulatory Visit: Payer: Self-pay

## 2021-05-15 DIAGNOSIS — I25118 Atherosclerotic heart disease of native coronary artery with other forms of angina pectoris: Secondary | ICD-10-CM | POA: Diagnosis not present

## 2021-05-15 DIAGNOSIS — J439 Emphysema, unspecified: Secondary | ICD-10-CM | POA: Diagnosis not present

## 2021-05-15 DIAGNOSIS — I5033 Acute on chronic diastolic (congestive) heart failure: Secondary | ICD-10-CM | POA: Diagnosis not present

## 2021-05-15 DIAGNOSIS — K219 Gastro-esophageal reflux disease without esophagitis: Secondary | ICD-10-CM | POA: Diagnosis not present

## 2021-05-15 DIAGNOSIS — M545 Low back pain, unspecified: Secondary | ICD-10-CM | POA: Diagnosis not present

## 2021-05-15 DIAGNOSIS — J9621 Acute and chronic respiratory failure with hypoxia: Secondary | ICD-10-CM | POA: Diagnosis not present

## 2021-05-15 DIAGNOSIS — K5520 Angiodysplasia of colon without hemorrhage: Secondary | ICD-10-CM | POA: Diagnosis not present

## 2021-05-15 DIAGNOSIS — G8929 Other chronic pain: Secondary | ICD-10-CM | POA: Diagnosis not present

## 2021-05-15 DIAGNOSIS — I110 Hypertensive heart disease with heart failure: Secondary | ICD-10-CM | POA: Diagnosis not present

## 2021-05-15 LAB — CULTURE, BLOOD (SINGLE)
Culture: NO GROWTH
Special Requests: ADEQUATE

## 2021-05-15 NOTE — Patient Outreach (Signed)
[place] [facility]) Care Management  05/15/2021  [name] 07/07/1941 [id number]   Telephone call to patient for follow up from Hospitalization.  No answer.  HIPAA compliant voice message left.    Plan: RN CM will attempt patient again within 4 business days.    [name], RN, MSN [place] Management Care Management Coordinator Direct Line [phone] Cell [phone] Toll Free: [phone]  Fax: [phone]

## 2021-05-21 ENCOUNTER — Other Ambulatory Visit: Payer: Self-pay

## 2021-05-22 DIAGNOSIS — K219 Gastro-esophageal reflux disease without esophagitis: Secondary | ICD-10-CM | POA: Diagnosis not present

## 2021-05-22 DIAGNOSIS — J439 Emphysema, unspecified: Secondary | ICD-10-CM | POA: Diagnosis not present

## 2021-05-22 DIAGNOSIS — G8929 Other chronic pain: Secondary | ICD-10-CM | POA: Diagnosis not present

## 2021-05-22 DIAGNOSIS — K5520 Angiodysplasia of colon without hemorrhage: Secondary | ICD-10-CM | POA: Diagnosis not present

## 2021-05-22 DIAGNOSIS — I110 Hypertensive heart disease with heart failure: Secondary | ICD-10-CM | POA: Diagnosis not present

## 2021-05-22 DIAGNOSIS — I25118 Atherosclerotic heart disease of native coronary artery with other forms of angina pectoris: Secondary | ICD-10-CM | POA: Diagnosis not present

## 2021-05-22 DIAGNOSIS — I5033 Acute on chronic diastolic (congestive) heart failure: Secondary | ICD-10-CM | POA: Diagnosis not present

## 2021-05-22 DIAGNOSIS — J9621 Acute and chronic respiratory failure with hypoxia: Secondary | ICD-10-CM | POA: Diagnosis not present

## 2021-05-22 DIAGNOSIS — M545 Low back pain, unspecified: Secondary | ICD-10-CM | POA: Diagnosis not present

## 2021-05-23 DIAGNOSIS — K5520 Angiodysplasia of colon without hemorrhage: Secondary | ICD-10-CM | POA: Diagnosis not present

## 2021-05-23 DIAGNOSIS — K219 Gastro-esophageal reflux disease without esophagitis: Secondary | ICD-10-CM | POA: Diagnosis not present

## 2021-05-23 DIAGNOSIS — I110 Hypertensive heart disease with heart failure: Secondary | ICD-10-CM | POA: Diagnosis not present

## 2021-05-23 DIAGNOSIS — I5033 Acute on chronic diastolic (congestive) heart failure: Secondary | ICD-10-CM | POA: Diagnosis not present

## 2021-05-23 DIAGNOSIS — I25118 Atherosclerotic heart disease of native coronary artery with other forms of angina pectoris: Secondary | ICD-10-CM | POA: Diagnosis not present

## 2021-05-23 DIAGNOSIS — G8929 Other chronic pain: Secondary | ICD-10-CM | POA: Diagnosis not present

## 2021-05-23 DIAGNOSIS — M545 Low back pain, unspecified: Secondary | ICD-10-CM | POA: Diagnosis not present

## 2021-05-23 DIAGNOSIS — J439 Emphysema, unspecified: Secondary | ICD-10-CM | POA: Diagnosis not present

## 2021-05-23 DIAGNOSIS — J9621 Acute and chronic respiratory failure with hypoxia: Secondary | ICD-10-CM | POA: Diagnosis not present

## 2021-05-24 ENCOUNTER — Other Ambulatory Visit: Payer: Self-pay

## 2021-05-24 DIAGNOSIS — G8929 Other chronic pain: Secondary | ICD-10-CM | POA: Diagnosis not present

## 2021-05-24 DIAGNOSIS — J439 Emphysema, unspecified: Secondary | ICD-10-CM | POA: Diagnosis not present

## 2021-05-24 DIAGNOSIS — M545 Low back pain, unspecified: Secondary | ICD-10-CM | POA: Diagnosis not present

## 2021-05-24 DIAGNOSIS — J9621 Acute and chronic respiratory failure with hypoxia: Secondary | ICD-10-CM | POA: Diagnosis not present

## 2021-05-24 DIAGNOSIS — K219 Gastro-esophageal reflux disease without esophagitis: Secondary | ICD-10-CM | POA: Diagnosis not present

## 2021-05-24 DIAGNOSIS — K5520 Angiodysplasia of colon without hemorrhage: Secondary | ICD-10-CM | POA: Diagnosis not present

## 2021-05-24 DIAGNOSIS — I25118 Atherosclerotic heart disease of native coronary artery with other forms of angina pectoris: Secondary | ICD-10-CM | POA: Diagnosis not present

## 2021-05-24 DIAGNOSIS — I5033 Acute on chronic diastolic (congestive) heart failure: Secondary | ICD-10-CM | POA: Diagnosis not present

## 2021-05-24 DIAGNOSIS — I110 Hypertensive heart disease with heart failure: Secondary | ICD-10-CM | POA: Diagnosis not present

## 2021-05-24 NOTE — Patient Outreach (Signed)
[place] [facility]) Care Management  05/24/2021  Aleisha Paone [birth year] [id number]   Telephone call to patient for follow up. No answer.  HIPAA compliant voice message left.  Telephone call to granddaughter Lilia Pro.  No answer.  Unable to leave a message.    Plan: RN CM will attempt patient as previously scheduled.    [name], RN, MSN [place] Management Care Management Coordinator Direct Line [phone] Cell [phone] Toll Free: [phone]  Fax: [phone]

## 2021-05-24 NOTE — Patient Outreach (Signed)
[place] [facility]) Care Management  05/24/2021  Naama Sappington 03/14/1941 [id number]   Date of Review: 05/23/21 Reason:  Readmission  PCP: Dr. Vassie Loll Eyk Insurance: [employer]  Medical Info: Alga Southall is a 80 y.o. 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 Patient normally lives alone in her home but has family support and has stayed with her son in the past after hospitalization.  Admissions: Patient admitted on 04/29/21 with increasing shortness of breath. Patient was found to have respiratory failure due to acute COPD exacerbation and acute heart failure exacerbation.  Patient treated with steroids, antibiotic and lasix and discharged home with home health on 05/04/21.  Patient admitted again on 05/10/21 with increasing shortness of breath.  She was diagnosed with chronic respiratory failure given her COPD and heart failure. Patient discharged home on 05/13/21 with home health.  There is some mention from pulmonology if problems return possible palliative care consult.     Disposition: RN CM continues outreach for education and support.   Recommendation: Goals of care conversation.

## 2021-05-25 DIAGNOSIS — J449 Chronic obstructive pulmonary disease, unspecified: Secondary | ICD-10-CM | POA: Diagnosis not present

## 2021-05-28 DIAGNOSIS — I5033 Acute on chronic diastolic (congestive) heart failure: Secondary | ICD-10-CM | POA: Diagnosis not present

## 2021-05-28 DIAGNOSIS — I110 Hypertensive heart disease with heart failure: Secondary | ICD-10-CM | POA: Diagnosis not present

## 2021-05-28 DIAGNOSIS — M545 Low back pain, unspecified: Secondary | ICD-10-CM | POA: Diagnosis not present

## 2021-05-28 DIAGNOSIS — I25118 Atherosclerotic heart disease of native coronary artery with other forms of angina pectoris: Secondary | ICD-10-CM | POA: Diagnosis not present

## 2021-05-28 DIAGNOSIS — J9621 Acute and chronic respiratory failure with hypoxia: Secondary | ICD-10-CM | POA: Diagnosis not present

## 2021-05-28 DIAGNOSIS — K5520 Angiodysplasia of colon without hemorrhage: Secondary | ICD-10-CM | POA: Diagnosis not present

## 2021-05-28 DIAGNOSIS — G8929 Other chronic pain: Secondary | ICD-10-CM | POA: Diagnosis not present

## 2021-05-28 DIAGNOSIS — K219 Gastro-esophageal reflux disease without esophagitis: Secondary | ICD-10-CM | POA: Diagnosis not present

## 2021-05-28 DIAGNOSIS — J439 Emphysema, unspecified: Secondary | ICD-10-CM | POA: Diagnosis not present

## 2021-05-31 ENCOUNTER — Other Ambulatory Visit: Payer: Self-pay

## 2021-05-31 NOTE — Patient Outreach (Signed)
[place] [facility]) Care Management  05/31/2021  Shauntell Iglesia 1941-01-10 [id number]   Telephone call to patient for follow up.  No answer.  HIPAA compliant voice message left.    Plan: RN CM will attempt again in 2-3 weeks.   [name], RN, MSN [place] Management Care Management Coordinator Direct Line [phone] Cell [phone] Toll Free: [phone]  Fax: [phone]

## 2021-06-03 ENCOUNTER — Other Ambulatory Visit: Payer: Self-pay

## 2021-06-03 ENCOUNTER — Ambulatory Visit (INDEPENDENT_AMBULATORY_CARE_PROVIDER_SITE_OTHER): Payer: Medicare HMO | Admitting: Internal Medicine

## 2021-06-03 ENCOUNTER — Encounter: Payer: Self-pay | Admitting: Internal Medicine

## 2021-06-03 VITALS — BP 122/70 | HR 70 | Ht 68.0 in | Wt 172.2 lb

## 2021-06-03 DIAGNOSIS — Z862 Personal history of diseases of the blood and blood-forming organs and certain disorders involving the immune mechanism: Secondary | ICD-10-CM | POA: Diagnosis not present

## 2021-06-03 DIAGNOSIS — Z8709 Personal history of other diseases of the respiratory system: Secondary | ICD-10-CM

## 2021-06-03 DIAGNOSIS — N189 Chronic kidney disease, unspecified: Secondary | ICD-10-CM

## 2021-06-03 NOTE — Progress Notes (Signed)
OV 06/03/2021  Subjective:  Patient ID: [name], female , DOB: 1941-03-18 , age 80 y.o. , MRN: [id number] , ADDRESS: [place] [id number] PCP Judy Roger, MD Patient Care Team: [name], [name], MD as PCP - General (Internal Medicine) Fay Records, MD as PCP - Cardiology (Cardiology) Kary Kos, MD as Consulting Physician (Neurosurgery) Jon Billings, RN as [facility]  This Provider for this visit: Treatment Team:  Attending Provider: Brand Males, MD    06/03/2021 -   Chief Complaint  Patient presents with   Hospitalization Follow-up    Pt hospitalized 5/9-5/14 and then again 5/20-5/23.  Pt states she is slowly getting better since she has been out of the hospital. States she is SOB all the time which is worse when she stands and with activities.     HPI [name] 80 y.o. -last seen by myself in 2009.  At that time diagnosed with obstructive lung disease.  In 2018 FEV1 0.86 L / 36%.  She tells me that up until 3 years ago she was fairly stable but then after that she had a significant admission with renal failure and after that has had 5 admissions.  She has been on oxygen for the last 3 years since admission with renal failure.  Her granddaughter [name] is with her and is also giving some of the history.  Generally admissions are for medical issues including diastolic heart failure.  Most recent May 2022 admission was from obstructive lung disease exacerbation.  Critical care medicine saw the patient.  She has since been referred here.  Review of the records indicate last blood gas was in 2019.  Her last echocardiogram was in October 2021.  She tells me that she is significant amount of dyspnea even for minimal exertion.  This is really making her life difficult.  She is very deconditioned.  She is able to change clothes but her ECOG is around 3 and she has class III dyspnea.  She is chronic venous  stasis edema with redness.  Home care agency tell status not cellulitis but she feels her legs are burning.  She has appointment with primary care physician tomorrow.  She has a remote 45 pack smoking history.  CT Chest data May 2022 pulmonary embolism ruled out.  No obvious evidence of emphysema reported.  No pneumonia.  No results found.    PFT  PFT Results Latest Ref Rng & Units 09/29/2017  FVC-Pre L 1.71  FVC-Predicted Pre % 55  FVC-Post L 1.92  FVC-Predicted Post % 62  Pre FEV1/FVC % % 50  Post FEV1/FCV % % 52  FEV1-Pre L 0.86  FEV1-Predicted Pre % 36  FEV1-Post L 1.01  DLCO uncorrected ml/min/mmHg 16.04  DLCO UNC% % 56  DLCO corrected ml/min/mmHg 14.67  DLCO COR %Predicted % 51  DLVA Predicted % 83       has a past medical history of Anemia, Anxiety, Arthralgia, Blood transfusion (2012; 02/2018), CAD (coronary artery disease), Carotid artery disease ([place]), CHF (congestive heart failure) ([place]) (01/21/2020), Chronic diastolic heart failure ([place]), Chronic lower back pain, COPD (chronic obstructive pulmonary disease) ([place]), Depression, Diuretic-induced hypokalemia (10/08/2018), DVT of lower extremity (deep venous thrombosis) ([place]), Dyspnea, Dysrhythmia, Exertional angina ([place]), GERD (gastroesophageal reflux disease), Gout, HLD (hyperlipidemia), Hypertension, Hyperthyroidism, L Subclavian Artery Stenosis, Obesity (BMI 30-39.9) (2009), Osteoarthritis, Oxygen deficiency, Oxygen dependent, Pneumonia, PVD (peripheral vascular disease) ([place]), and Sleep apnea (2012).   reports  that she quit smoking about 22 years ago. Her smoking use included cigarettes. She has a 45.00 pack-year smoking history. She has never used smokeless tobacco.  Past Surgical History:  Procedure Laterality Date   ANKLE SURGERY  2004   ANTERIOR CERVICAL DECOMP/DISCECTOMY FUSION  11/2007   [place]   lower; another scheduled, opt. reports 4 back- lumbar, 3 cerv. fusions   for later 2009   CARDIAC CATHETERIZATION  11/2009   Patent LIMA to LAD and patent SVG to OM1. Occluded SVG to ramus and diagonal. Left main: 90% ostial stenosis, LCX 60-70% proximal stenosis   CATARACT EXTRACTION W/ INTRAOCULAR LENS  IMPLANT, BILATERAL Bilateral 2006   COLONOSCOPY WITH PROPOFOL N/A 02/17/2017   Procedure: COLONOSCOPY WITH PROPOFOL;  Surgeon: Jerene Bears, MD;  Location: [facility] ENDOSCOPY;  Service: Endoscopy;  Laterality: N/A;   CORONARY ANGIOPLASTY WITH STENT PLACEMENT  11/2009   Drug eluting stent to left main artery: 4.0 X 12 mm Ion    CORONARY ARTERY BYPASS GRAFT  2004   "CABG X4"   ESOPHAGOGASTRODUODENOSCOPY N/A 02/14/2017   Procedure: ESOPHAGOGASTRODUODENOSCOPY (EGD);  Surgeon: Doran Stabler, MD;  [facility] END[facility]COPY;  Service: Endoscopy;  Laterality: N/A;   GIVENS CAPSULE STUDY N/A 02/15/2017   Procedure: GIVENS CAPSULE STUDY;  Surgeon: Doran Stabler, MD;  Location: [facility];  Service: Endoscopy;  Laterality: N/A;   GROIN MASS OPEN BIOPSY  2004   IR IVC FILTER PLMT / S&I /IMG GUID/MOD SED  03/14/2018   IR PARACENTESIS  03/10/2018   JOINT REPLACEMENT     LAPAROSCOPIC CHOLECYSTECTOMY     PERIPHERAL VASCULAR INTERVENTION Left 03/05/2018   Procedure: PERIPHERAL VASCULAR INTERVENTION;  Surgeon: Elam Dutch, MD;  Location: [place] [facility];  Service: Cardiovascular;  Laterality: Left;  Attempted unsuccess\ful Per Dr. Eden Lathe   PERIPHERAL VASCULAR INTERVENTION  03/08/2018   Procedure: PERIPHERAL VASCULAR INTERVENTION;  Surgeon: Waynetta Sandy, MD;  Location: [place] [facility];  Service: Cardiovascular;;  SMA Stent    REPLACEMENT TOTAL KNEE BILATERAL Bilateral 2012   REVERSE SHOULDER ARTHROPLASTY  02/26/2012   Procedure: REVERSE SHOULDER ARTHROPLASTY;  Surgeon: Marin Shutter, MD;  Location: [facility];  Service: Orthopedics;  Laterality: Right;  RIGHT SHOULDER REVERSED ARTHROPLASTY   SHOULDER ARTHROSCOPY WITH SUBACROMIAL DECOMPRESSION Left 02/10/2013    Procedure: SHOULDER ARTHROSCOPY WITH SUBACROMIAL DECOMPRESSION DISTAL CLAVICLE RESECTION;  Surgeon: Marin Shutter, MD;  Location: [facility];  Service: Orthopedics;  Laterality: Left;  DISTAL CLAVICLE RESECTION   TONSILLECTOMY     TOTAL HIP ARTHROPLASTY  2007   Right   TOTAL HIP ARTHROPLASTY Left 02/07/2020   Procedure: TOTAL HIP ARTHROPLASTY ANTERIOR APPROACH;  Surgeon: Paralee Cancel, MD;  Location: [facility];  Service: Orthopedics;  Laterality: Left;  70 mins   TRANSURETHRAL RESECTION OF BLADDER TUMOR N/A 05/31/2018   Procedure: TRANSURETHRAL RESECTION OF BLADDER TUMOR (TURBT);  Surgeon: Lucas Mallow, MD;  Location: [facility];  Service: Urology;  Laterality: N/A;   VISCERAL ANGIOGRAPHY N/A 03/05/2018   Procedure: VISCERAL ANGIOGRAPHY;  Surgeon: Elam Dutch, MD;  Location: [facility];  Service: Cardiovascular;  Laterality: N/A;   VISCERAL ANGIOGRAPHY N/A 03/08/2018   Procedure: VISCERAL ANGIOGRAPHY;  Surgeon: Waynetta Sandy, MD;  Location: [facility];  Service: Cardiovascular;  Laterality: N/A;    Allergies  Allergen Reactions   Ativan [Lorazepam] Other (See Comments)    Confusion    Pitavastatin Itching  Reaction to Livalo   Ropinirole Nausea Only and Other (See Comments)    Makes legs jump, also   Zofran [Ondansetron Hcl] Nausea Only   Zolpidem Tartrate Anxiety and Other (See Comments)    CONFUSION    Penicillins Rash and Other (See Comments)    Tolerated cephalosporins in past Has patient had a PCN reaction causing immediate rash, facial/tongue/throat swelling, SOB or lightheadedness with hypotension: Yes Has patient had a PCN reaction causing severe rash involving mucus membranes or skin necrosis: No Has patient had a PCN reaction that required hospitalization: No Has patient had a PCN reaction occurring within the last 10 years: No If all of the above answers are "NO", then may proceed with Cephalosporin use.     Immunization History  Administered  Date(s) Administered   Influenza, High Dose Seasonal PF 09/25/2020   Influenza-Unspecified 09/30/2018    Family History  Problem Relation Age of Onset   Ovarian cancer Mother    Lung cancer Father    COPD Father    Heart disease Father    Heart disease Sister    Stomach cancer Sister    Colitis Sister      Current Outpatient Medications:    albuterol (PROVENTIL) (2.5 MG/3ML) 0.083% nebulizer solution, Take 2.5 mg by nebulization every 6 (six) hours as needed for wheezing or shortness of breath., Disp: , Rfl:    allopurinol (ZYLOPRIM) 100 MG tablet, Take 100 mg by mouth daily with breakfast., Disp: , Rfl:    ARIPiprazole (ABILIFY) 2 MG tablet, Take 2 mg by mouth daily with breakfast., Disp: , Rfl:    aspirin EC 81 MG tablet, Take 81 mg by mouth daily with breakfast. Swallow whole., Disp: , Rfl:    atorvastatin (LIPITOR) 20 MG tablet, Take 20 mg by mouth daily with supper., Disp: , Rfl:    cholecalciferol (VITAMIN D3) 25 MCG (1000 UNIT) tablet, Take 1,000 Units by mouth daily with supper., Disp: , Rfl:    cyclobenzaprine (FLEXERIL) 10 MG tablet, Take 10 mg by mouth daily., Disp: , Rfl:    ferrous sulfate 325 (65 FE) MG tablet, Take 325 mg by mouth daily with breakfast., Disp: , Rfl:    Fluticasone-Umeclidin-Vilant (TRELEGY ELLIPTA) 100-62.5-25 MCG/INH AEPB, Inhale 1 puff into the lungs daily., Disp: 1 each, Rfl: 5   furosemide (LASIX) 80 MG tablet, Take 1 tablet (80 mg total) by mouth daily., Disp: 30 tablet, Rfl: 1   gabapentin (NEURONTIN) 100 MG capsule, Take 100 mg by mouth daily with breakfast., Disp: , Rfl:    Ipratropium-Albuterol (COMBIVENT RESPIMAT) 20-100 MCG/ACT AERS respimat, Inhale 1 puff into the lungs 3 (three) times daily as needed for wheezing or shortness of breath., Disp: , Rfl:    levothyroxine (SYNTHROID) 50 MCG tablet, Take 1 tablet (50 mcg total) by mouth daily at 6 (six) AM., Disp: 90 tablet, Rfl: 3   magnesium oxide (MAG-OX) 400 (241.3 Mg) MG tablet, Take 1 tablet  (400 mg total) by mouth 2 (two) times daily., Disp: , Rfl:    Melatonin 10 MG TABS, Take 30 mg by mouth at bedtime., Disp: , Rfl:    nitroGLYCERIN (NITROSTAT) 0.4 MG SL tablet, Place 1 tablet (0.4 mg total) under the tongue every 5 (five) minutes as needed for chest pain. For chest pain, Disp: 25 tablet, Rfl: 3   omeprazole (PRILOSEC) 40 MG capsule, TAKE 1 CAPSULE BY MOUTH 2 TIMES DAILY., Disp: 180 capsule, Rfl: 2   OXYGEN, Inhale 4 L/min into the lungs as needed (  DURING ALL TIMES OF EXERTION). , Disp: , Rfl:    pilocarpine (PILOCAR) 1 % ophthalmic solution, Place 1 drop into both eyes 2 (two) times daily., Disp: , Rfl:    potassium chloride SA (KLOR-CON) 20 MEQ tablet, Take 20 mEq by mouth daily with lunch., Disp: , Rfl:    pramipexole (MIRAPEX) 0.25 MG tablet, Take 0.25 mg by mouth daily with supper., Disp: , Rfl:    spironolactone (ALDACTONE) 25 MG tablet, Take 0.5 tablets (12.5 mg total) by mouth daily., Disp: 15 tablet, Rfl: 0   traZODone (DESYREL) 50 MG tablet, Take 150 mg by mouth at bedtime. , Disp: , Rfl:    vitamin C (ASCORBIC ACID) 500 MG tablet, Take 500 mg by mouth daily with lunch., Disp: , Rfl:    XTAMPZA ER 9 MG C12A, Take 9 mg by mouth 2 (two) times daily., Disp: , Rfl:    roflumilast (DALIRESP) 500 MCG TABS tablet, Take 500 mcg by mouth daily with breakfast. (Patient not taking: Reported on 06/03/2021), Disp: , Rfl:       Objective:   Vitals:   06/03/21 1547  BP: 122/70  Pulse: 70  SpO2: 96%  Weight: 172 lb 3.2 oz (78.1 kg)  Height: 5\' 8"  (1.727 m)    Estimated body mass index is 26.18 kg/m as calculated from the following:   Height as of this encounter: 5\' 8"  (1.727 m).   Weight as of this encounter: 172 lb 3.2 oz (78.1 kg).  @WEIGHTCHANGE @  [employer]   06/03/21 1547  Weight: 172 lb 3.2 oz (78.1 kg)     Physical Exam  General: No distress. Elderly female, frail, sitting in wheel chair Neuro: Alert and Oriented x 3. GCS 15. Speech normal Psych:  [place]leasant Resp:  Barrel Chest - no.  Wheeze - no, Crackles - no, No overt respiratory distress CVS: Normal heart sounds. Murmurs - no Ext: Stigmata of Connective Tissue Disease - no.  Bilateral lower extremity edema 3+ with skin stretching and redness and warmth.  Apparently this is chronic HEENT: Normal upper airway. PEERL +. No post nasal drip        Assessment:       ICD-10-CM   1. History of respiratory failure  Z87.09     2. Chronic kidney disease, unspecified CKD stage  N18.9     3. History of anemia  Z86.2      Consent we are dealing with refractory issues of chronic lung disease and chronic diastolic heart failure and frailty.  We will get a blood gas and repeat echocardiogram to see if BiPAP for any cardiac optimization can be done.    Plan:     Patient Instructions     ICD-10-CM   1. History of respiratory failure  Z87.09     2. Chronic kidney disease, unspecified CKD stage  N18.9     3. History of anemia  Z86.2       Do cbc, bmet, lft, bnp Do ECHO  Do ABG Continue o2  Plan  - return to see APP next few weeks to discuss results  - if abg shows high co2, might benefit from bipap at night    SIGNATURE    Dr. Brand Males, M.D., F.C.C.P,  Pulmonary and Critical Care Medicine Staff Physician, [place] Director - Interstitial Lung Disease  Program  Pulmonary [place] at [place], [place], [address]  Pager: [phone], If no answer or between  15:00h - 7:00h: call [phone] Telephone: [phone]  5:08 PM 06/03/2021

## 2021-06-03 NOTE — Patient Instructions (Addendum)
ICD-10-CM   1. History of respiratory failure  Z87.09     2. Chronic kidney disease, unspecified CKD stage  N18.9     3. History of anemia  Z86.2       Do cbc, bmet, lft, bnp Do ECHO  Do ABG Continue o2 and reglulra medicatiions  Plan  - return to see APP next few weeks to discuss results  - if abg shows high co2, might benefit from bipap at night  - do med review optimization at followup

## 2021-06-04 DIAGNOSIS — J439 Emphysema, unspecified: Secondary | ICD-10-CM | POA: Diagnosis not present

## 2021-06-04 DIAGNOSIS — M545 Low back pain, unspecified: Secondary | ICD-10-CM | POA: Diagnosis not present

## 2021-06-04 DIAGNOSIS — I110 Hypertensive heart disease with heart failure: Secondary | ICD-10-CM | POA: Diagnosis not present

## 2021-06-04 DIAGNOSIS — I5032 Chronic diastolic (congestive) heart failure: Secondary | ICD-10-CM | POA: Diagnosis not present

## 2021-06-04 DIAGNOSIS — K5520 Angiodysplasia of colon without hemorrhage: Secondary | ICD-10-CM | POA: Diagnosis not present

## 2021-06-04 DIAGNOSIS — K219 Gastro-esophageal reflux disease without esophagitis: Secondary | ICD-10-CM | POA: Diagnosis not present

## 2021-06-04 DIAGNOSIS — G894 Chronic pain syndrome: Secondary | ICD-10-CM | POA: Diagnosis not present

## 2021-06-04 DIAGNOSIS — I5033 Acute on chronic diastolic (congestive) heart failure: Secondary | ICD-10-CM | POA: Diagnosis not present

## 2021-06-04 DIAGNOSIS — I25118 Atherosclerotic heart disease of native coronary artery with other forms of angina pectoris: Secondary | ICD-10-CM | POA: Diagnosis not present

## 2021-06-04 DIAGNOSIS — G8929 Other chronic pain: Secondary | ICD-10-CM | POA: Diagnosis not present

## 2021-06-04 DIAGNOSIS — J9621 Acute and chronic respiratory failure with hypoxia: Secondary | ICD-10-CM | POA: Diagnosis not present

## 2021-06-05 DIAGNOSIS — I25118 Atherosclerotic heart disease of native coronary artery with other forms of angina pectoris: Secondary | ICD-10-CM | POA: Diagnosis not present

## 2021-06-05 DIAGNOSIS — K219 Gastro-esophageal reflux disease without esophagitis: Secondary | ICD-10-CM | POA: Diagnosis not present

## 2021-06-05 DIAGNOSIS — M545 Low back pain, unspecified: Secondary | ICD-10-CM | POA: Diagnosis not present

## 2021-06-05 DIAGNOSIS — I110 Hypertensive heart disease with heart failure: Secondary | ICD-10-CM | POA: Diagnosis not present

## 2021-06-05 DIAGNOSIS — G8929 Other chronic pain: Secondary | ICD-10-CM | POA: Diagnosis not present

## 2021-06-05 DIAGNOSIS — I5033 Acute on chronic diastolic (congestive) heart failure: Secondary | ICD-10-CM | POA: Diagnosis not present

## 2021-06-05 DIAGNOSIS — J9621 Acute and chronic respiratory failure with hypoxia: Secondary | ICD-10-CM | POA: Diagnosis not present

## 2021-06-05 DIAGNOSIS — J439 Emphysema, unspecified: Secondary | ICD-10-CM | POA: Diagnosis not present

## 2021-06-05 DIAGNOSIS — K5520 Angiodysplasia of colon without hemorrhage: Secondary | ICD-10-CM | POA: Diagnosis not present

## 2021-06-08 DIAGNOSIS — J449 Chronic obstructive pulmonary disease, unspecified: Secondary | ICD-10-CM | POA: Diagnosis not present

## 2021-06-10 DIAGNOSIS — J439 Emphysema, unspecified: Secondary | ICD-10-CM | POA: Diagnosis not present

## 2021-06-10 DIAGNOSIS — I25118 Atherosclerotic heart disease of native coronary artery with other forms of angina pectoris: Secondary | ICD-10-CM | POA: Diagnosis not present

## 2021-06-10 DIAGNOSIS — G8929 Other chronic pain: Secondary | ICD-10-CM | POA: Diagnosis not present

## 2021-06-10 DIAGNOSIS — K219 Gastro-esophageal reflux disease without esophagitis: Secondary | ICD-10-CM | POA: Diagnosis not present

## 2021-06-10 DIAGNOSIS — M545 Low back pain, unspecified: Secondary | ICD-10-CM | POA: Diagnosis not present

## 2021-06-10 DIAGNOSIS — K5520 Angiodysplasia of colon without hemorrhage: Secondary | ICD-10-CM | POA: Diagnosis not present

## 2021-06-10 DIAGNOSIS — I5033 Acute on chronic diastolic (congestive) heart failure: Secondary | ICD-10-CM | POA: Diagnosis not present

## 2021-06-10 DIAGNOSIS — J9621 Acute and chronic respiratory failure with hypoxia: Secondary | ICD-10-CM | POA: Diagnosis not present

## 2021-06-10 DIAGNOSIS — I110 Hypertensive heart disease with heart failure: Secondary | ICD-10-CM | POA: Diagnosis not present

## 2021-06-19 ENCOUNTER — Other Ambulatory Visit: Payer: Self-pay

## 2021-06-19 ENCOUNTER — Encounter (HOSPITAL_COMMUNITY): Payer: Self-pay | Admitting: Internal Medicine

## 2021-06-19 NOTE — Telephone Encounter (Signed)
This encounter was created in error - please disregard.

## 2021-06-19 NOTE — Patient Instructions (Signed)
Goals Addressed             This Visit's Progress    COMPLETED: [facility]N-Make and Keep All Appointments   On track    Barriers: None Timeframe:  Long-Range Goal Priority:  High Start Date:   12/20/20                          Expected End Date:08/21/21              Follow Up Date 06/20/21  - ask family or friend for a ride    Why is this important?   Part of staying healthy is seeing the doctor for follow-up care.  If you forget your appointments, there are some things you can do to stay on track.    Notes: Family takes to appointments 03/22/21 patient continues to follow up with physicians as scheduled.  05/06/21 Family continues to assist with appointments.        THN-Track and Manage Fluids and Swelling   On track    Barriers: Knowledge Timeframe:  Long-Range Goal Priority:  High Start Date:   12/20/20                          Expected End Date:12/21/21 Follow up 07/21/21   - call office if I gain more than 2 pounds in one day or 5 pounds in one week - track weight in diary - use salt in moderation - watch for swelling in feet, ankles and legs every day - weigh myself daily    Why is this important?   It is important to check your weight daily and watch how much salt and liquids you have.  It will help you to manage your heart failure.    Notes: Patient weighs daily. 12/20/20 173 lbs 01/10/21- 171 lbs. 02/20/21 patient weight 165 lbs  03/22/21 patient weight today 170 lbs but patient reports weight up to 176 lbs earlier this week and MD increased her lasix. Patient states she has to call to report her weight today.  Reiterated the importance of a low salt diet.   05/06/21 Patient weight 168 lbs.  Reiterated heart failure management.   06/19/21 Patient weight 170 lbs.   Heart Failure Management Re-enforced Please weight daily or as ordered by your doctor Report to your doctor weight gain of 2 -3 pounds in a day or 5 pounds in a week Limit salt intake Monitor for shortness of  breath, swelling of feet, ankles or abdomen and weight gain. Never use the salt shaker.  Read all food labels and avoid canned, processed and pickled foods.   Follow your doctor's recommendations for daily salt intake

## 2021-06-19 NOTE — Patient Outreach (Signed)
[facility]) Care Management  [place]  06/19/2021   [name] 01/17/1941 [id number]  Subjective: Telephone call to patient for follow up. Patient reports she is doing pretty good.  She has been staying with her son and now is at her home during the day. She continues with her management of her heart failure.  Re-enforced heart failure management and timely follow up with physicians. She verbalized understanding.   Objective:   Encounter Medications:  Outpatient Encounter Medications as of 06/19/2021  Medication Sig   albuterol (PROVENTIL) (2.5 MG/3ML) 0.083% nebulizer solution Take 2.5 mg by nebulization every 6 (six) hours as needed for wheezing or shortness of breath.   allopurinol (ZYLOPRIM) 100 MG tablet Take 100 mg by mouth daily with breakfast.   ARIPiprazole (ABILIFY) 2 MG tablet Take 2 mg by mouth daily with breakfast.   aspirin EC 81 MG tablet Take 81 mg by mouth daily with breakfast. Swallow whole.   atorvastatin (LIPITOR) 20 MG tablet Take 20 mg by mouth daily with supper.   cholecalciferol (VITAMIN D3) 25 MCG (1000 UNIT) tablet Take 1,000 Units by mouth daily with supper.   cyclobenzaprine (FLEXERIL) 10 MG tablet Take 10 mg by mouth daily.   ferrous sulfate 325 (65 FE) MG tablet Take 325 mg by mouth daily with breakfast.   furosemide (LASIX) 80 MG tablet Take 1 tablet (80 mg total) by mouth daily.   gabapentin (NEURONTIN) 100 MG capsule Take 100 mg by mouth daily with breakfast.   Ipratropium-Albuterol (COMBIVENT RESPIMAT) 20-100 MCG/ACT AERS respimat Inhale 1 puff into the lungs 3 (three) times daily as needed for wheezing or shortness of breath.   levothyroxine (SYNTHROID) 50 MCG tablet Take 1 tablet (50 mcg total) by mouth daily at 6 (six) AM.   magnesium oxide (MAG-OX) 400 (241.3 Mg) MG tablet Take 1 tablet (400 mg total) by mouth 2 (two) times daily.   Melatonin 10 MG TABS Take 30 mg by mouth at bedtime.   nitroGLYCERIN (NITROSTAT)  0.4 MG SL tablet Place 1 tablet (0.4 mg total) under the tongue every 5 (five) minutes as needed for chest pain. For chest pain   omeprazole (PRILOSEC) 40 MG capsule TAKE 1 CAPSULE BY MOUTH 2 TIMES DAILY.   OXYGEN Inhale 4 L/min into the lungs as needed (DURING ALL TIMES OF EXERTION).    pilocarpine (PILOCAR) 1 % ophthalmic solution Place 1 drop into both eyes 2 (two) times daily.   potassium chloride SA (KLOR-CON) 20 MEQ tablet Take 20 mEq by mouth daily with lunch.   pramipexole (MIRAPEX) 0.25 MG tablet Take 0.25 mg by mouth daily with supper.   roflumilast (DALIRESP) 500 MCG TABS tablet Take 500 mcg by mouth daily with breakfast. (Patient not taking: Reported on 06/03/2021)   spironolactone (ALDACTONE) 25 MG tablet Take 0.5 tablets (12.5 mg total) by mouth daily.   traZODone (DESYREL) 50 MG tablet Take 150 mg by mouth at bedtime.    vitamin C (ASCORBIC ACID) 500 MG tablet Take 500 mg by mouth daily with lunch.   XTAMPZA ER 9 MG C12A Take 9 mg by mouth 2 (two) times daily.   No facility-administered encounter medications on file as of 06/19/2021.    Functional Status:  In your present state of health, do you have any difficulty performing the following activities: 05/11/2021 04/29/2021  Hearing? N N  Vision? N N  Difficulty concentrating or making decisions? Y Y  Comment - -  Walking or climbing stairs? [name]  Comment - -  Dressing or bathing? N N  Comment - -  Doing errands, shopping? [place] and eating ? - -  Comment - -  Using the Toilet? - -  In the past six months, have you accidently leaked urine? - -  Do you have problems with loss of bowel control? - -  Managing your Medications? - -  Comment - -  Managing your Finances? - -  Comment - -  Housekeeping or managing your Housekeeping? - -  Comment - -  Some recent data might be hidden    Fall/Depression Screening: Fall Risk  02/20/2021 01/10/2021 11/07/2020  Falls in the past year? 0 0 0  Comment - -  -  Number falls in past yr: - - -  Injury with Fall? - - -  Risk for fall due to : - - -  Follow up - - -   PHQ 2/9 Scores 01/10/2021 11/07/2020 08/15/2020 03/30/2020 10/11/2019 10/12/2018 05/13/2018  PHQ - 2 Score 0 0 1 0 2 0 6  PHQ- 9 Score - - - - 17 - 20    Assessment:   Care Plan Care Plan : General Plan of Care (Adult)  Updates made by Jon Billings, RN since 06/19/2021 12:00 AM     Problem: Health Promotion or Disease Self-Management  .   Priority: High  Onset Date: 05/06/2021  Note:   Determine level of modifiable health risk.  Assess level of patient activation, level of readiness, importance and confidence  to make changes.  Evoke change talk using open-ended questions, pros and cons, as well as  looking forward.    Goal: Self-Management Plan Developed as evidenced by no readmission Completed 06/19/2021  Start Date: 02/20/2021  This Visit's Progress: On track  Priority: High  Note:   Evidence-based guidance:  Review biopsychosocial determinants of health screens.  Determine level of modifiable health risk.  Assess level of patient activation, level of readiness, importance and confidence to make changes.  Evoke change talk using open-ended questions, pros and cons, as well as looking forward.  Identify areas where behavior change may lead to improved health.  Notes: 05/06/21 Patient recent hospitalization with discharge on 05/05/21 after heart failure admission. Patient has family support.     Task: Mutually Develop and Royce Macadamia Achievement of Patient Goals Completed 06/19/2021  Due Date: 08/21/2021  Priority: Routine  Responsible User: Jon Billings, RN  Note:   Care Management Activities:    - questions answered - reassurance provided    Notes:     Care Plan : Heart Failure (Adult)  Updates made by Jon Billings, RN since 06/19/2021 12:00 AM     Problem: Symptom Exacerbation (Heart Failure)      Long-Range Goal: Symptom Exacerbation Prevented or Minimized as  evidenced by no exacerbation of heart failure   Start Date: 11/07/2020  Expected End Date: 12/21/2021  This Visit's Progress: On track  Recent Progress: On track  Priority: High  Note:   Evidence-based guidance:  Establish a mutually-agreed-upon early intervention process to communicate  with primary care provider when signs/symptoms worsen.  Notes:     Task: Identify and Minimize Risk of Heart Failure Exacerbation   Due Date: 08/21/2021  Priority: Routine  Responsible User: Jon Billings, RN  Note:   Care Management Activities:    - healthy lifestyle promoted - rescue (action) plan reviewed - self-awareness of signs/symptoms of worsening disease encouraged    Notes: 02/20/21  patient able to reports when to notify physician. 03/22/21 Patient weight today 170 lbs. Patient states she has notified the doctor of her weights and is to call today with update.  Patient with increase of lasix recently.  Discussed diet of low salt.  05/06/21 Patient with recent heart failure exacerbation.  Weight today 168 lbs. Patient has follow ups to schedule with heart doctor and PCP.  Family assists with appointments.   06/19/21 Patient weight 170 lbs.  Family active with care. Patient stays with son and family at night. Daughter in law does the cooking. Patient reports shortness of breath not as bad but feels she is in a good place.  Discussed palliative care with patient and what that means.   Heart Failure Management Re-enforced Please weight daily or as ordered by your doctor Report to your doctor weight gain of 2 -3 pounds in a day or 5 pounds in a week Limit salt intake Monitor for shortness of breath, swelling of feet, ankles or abdomen and weight gain. Never use the salt shaker.  Read all food labels and avoid canned, processed and pickled foods.   Follow your doctor's recommendations for daily salt intake      Goals Addressed               This Visit's Progress     COMPLETED: [facility]-Make  and Keep All Appointments   On track     Barriers: None Timeframe:  Long-Range Goal Priority:  High Start Date:   12/20/20                          Expected End Date:08/21/21              Follow Up Date 06/20/21  - ask family or friend for a ride    Why is this important?   Part of staying healthy is seeing the doctor for follow-up care.  If you forget your appointments, there are some things you can do to stay on track.    Notes: Family takes to appointments 03/22/21 patient continues to follow up with physicians as scheduled.  05/06/21 Family continues to assist with appointments.         THN-Track and Manage Fluids and Swelling (pt-stated)   On track     Barriers: Knowledge Timeframe:  Long-Range Goal Priority:  High Start Date:   12/20/20                          Expected End Date:12/21/21 Follow up 07/21/21   - call office if I gain more than 2 pounds in one day or 5 pounds in one week - track weight in diary - use salt in moderation - watch for swelling in feet, ankles and legs every day - weigh myself daily    Why is this important?   It is important to check your weight daily and watch how much salt and liquids you have.  It will help you to manage your heart failure.    Notes: Patient weighs daily. 12/20/20 173 lbs 01/10/21- 171 lbs. 02/20/21 patient weight 165 lbs  03/22/21 patient weight today 170 lbs but patient reports weight up to 176 lbs earlier this week and MD increased her lasix. Patient states she has to call to report her weight today.  Reiterated the importance of a low salt diet.   05/06/21 Patient weight 168 lbs.  Reiterated heart failure management.  06/19/21 Patient weight 170 lbs.   Heart Failure Management Re-enforced Please weight daily or as ordered by your doctor Report to your doctor weight gain of 2 -3 pounds in a day or 5 pounds in a week Limit salt intake Monitor for shortness of breath, swelling of feet, ankles or abdomen and weight gain. Never  use the salt shaker.  Read all food labels and avoid canned, processed and pickled foods.   Follow your doctor's recommendations for daily salt intake         Plan:  Follow-up: Patient agrees to Care Plan and Follow-up. Follow-up in 3-4 week(s)  [name], RN, MSN [place] Management Care Management Coordinator Direct Line [phone] Cell [phone] Toll Free: [phone]  Fax: [phone]

## 2021-06-20 ENCOUNTER — Other Ambulatory Visit: Payer: Self-pay

## 2021-06-20 ENCOUNTER — Ambulatory Visit (INDEPENDENT_AMBULATORY_CARE_PROVIDER_SITE_OTHER): Payer: Medicare HMO | Admitting: Family

## 2021-06-20 ENCOUNTER — Encounter: Payer: Self-pay | Admitting: Family

## 2021-06-20 VITALS — BP 110/70 | HR 94 | Ht 68.0 in | Wt 170.8 lb

## 2021-06-20 DIAGNOSIS — I10 Essential (primary) hypertension: Secondary | ICD-10-CM | POA: Diagnosis not present

## 2021-06-20 DIAGNOSIS — Z86718 Personal history of other venous thrombosis and embolism: Secondary | ICD-10-CM | POA: Diagnosis not present

## 2021-06-20 DIAGNOSIS — I25118 Atherosclerotic heart disease of native coronary artery with other forms of angina pectoris: Secondary | ICD-10-CM | POA: Diagnosis not present

## 2021-06-20 DIAGNOSIS — E782 Mixed hyperlipidemia: Secondary | ICD-10-CM | POA: Diagnosis not present

## 2021-06-20 DIAGNOSIS — I5032 Chronic diastolic (congestive) heart failure: Secondary | ICD-10-CM | POA: Diagnosis not present

## 2021-06-20 NOTE — Progress Notes (Signed)
Office Visit    Patient Name: [name] Date of Encounter: 06/20/2021  PCP:  [name], [facility]  Cardiologist:  Dorris Carnes, MD  Advanced Practice Provider:  No care team member to display Electrophysiologist:  None    Chief Complaint    [name] is a 80 y.o. female with a hx of CAD s/p CABG 2004 with DES to LM 8099, chronic diastolic heart failure, PAD, recurrent DVT/PE, GI bleeding on anticoagulation, COPD, PAF in postoperative setting, GERD, HLD, HTN, renal insufficiency presents today for hospital follow up.   Past Medical History    Past Medical History:  Diagnosis Date   Anemia    bld. transfusion post lumbar surgery- 2012   Anxiety    Arthralgia    NOS   Blood transfusion 2012; 02/2018   WITH BACK SURGERY; "related to hematoma in stomach" (10/06/2018)   CAD (coronary artery disease)    s/p CABG 2004; s/p DES to LM in 2010;  [place] 10/29/11: EF 50-55%, mild elevated filling pressures, no pulmonary HTN, LM 90% ISR, LAD and CFX occluded, S-RI occluded (old), S-OM3 ok and L-LAD ok, native nondominant RCA 95% -  med rx recommended ; Lexiscan Myoview 7/13 at [facility]: demonstrated "normal LV function, anterior attenuation and localized ischemia, inferior, basilar, mid section"   Carotid artery disease ([place])    Carotid US 8/33:  RICA 8-25; LICA 05-39; R subclavian stenosis - Repeat 1 year. // Carotid US 05/2019: R 1-39; L 40-59; R vertebral with atypical antegrade flow; R subclavian stenosis >> repeat 1 year // Carotid US 7/21: Bilat 40-59; L subclavian stenosis    CHF (congestive heart failure) ([place]) 01/21/2020   Chronic diastolic heart failure (HCC)    Echo 9/10: EF 76-73%, grade 1 diastolic dysfunction   Chronic lower back pain    COPD (chronic obstructive pulmonary disease) ([place])    Emphysema dxed by Dr. Woody Seller in [place] based on PFTs per pt in 2006; placed on albuterol   Depression    TAKES CELEXA  AND  (OFF-  WELBUTRIN)   Diuretic-induced hypokalemia 10/08/2018   DVT of lower extremity (deep venous thrombosis) (HCC)    recurrent. bilateral (2 episodes)   Dyspnea    home o2 when needed   Dysrhythmia    afib with cabg   Exertional angina (HCC)    Treated with Isosorbide, Ranexa, amlodipine; intolerant to metoprolol   GERD (gastroesophageal reflux disease)    Gout    "on daily RX" (10/06/2018)   HLD (hyperlipidemia)    Hypertension    Hyperthyroidism    L Subclavian Artery Stenosis    Carotid US 7/21    Obesity (BMI 30-39.9) 2009   BMI 33   Osteoarthritis    "all over; hands" (10/06/2018)   Oxygen deficiency    Oxygen dependent    4L at home as needed (10/06/2018)   Pneumonia    PVD (peripheral vascular disease) ([place])    Sleep apnea 2012   USED CPAP THEN  STARTED USING SPIRIVA , Nov. 2013- last evaluation , changed from mask to aparatus that is just for her nose.. ; reports 05-28-18 "the mask smothers me so i dont use it right now" (10/06/2018)   Past Surgical History:  Procedure Laterality Date   ANKLE SURGERY  2004   ANTERIOR CERVICAL DECOMP/DISCECTOMY FUSION  11/2007   [place]   lower; another scheduled, opt.  reports 4 back- lumbar, 3 cerv. fusions  for later 2009   CARDIAC CATHETERIZATION  11/2009   Patent LIMA to LAD and patent SVG to OM1. Occluded SVG to ramus and diagonal. Left main: 90% ostial stenosis, LCX 60-70% proximal stenosis   CATARACT EXTRACTION W/ INTRAOCULAR LENS  IMPLANT, BILATERAL Bilateral 2006   COLONOSCOPY WITH PROPOFOL N/A 02/17/2017   Procedure: COLONOSCOPY WITH PROPOFOL;  Surgeon: Jerene Bears, MD;  Location[facility] ENDOSCOPY;  Service: Endoscopy;  Laterality: N/A;   CORONARY ANGIOPLASTY WITH STENT PLACEMENT  11/2009   Drug eluting stent to left main artery: 4.0 X 12 mm Ion    CORONARY ARTERY BYPASS GRAFT  2004   "CABG X4"   ESOPHAGOGASTRODUODENOSCOPY N/A 02/14/2017   Procedure: ESOPHAGOGASTRODUODENOSCOPY (EGD);   Surgeon: Doran Stabler, MD;  Location: [facility] ENDOSCOPY;  Service: Endoscopy;  Laterality: N/A;   GIVENS CAPSULE STUDY N/A 02/15/2017   Procedure: GIVENS CAPSULE STUDY;  Surgeon: Doran Stabler, MD;  Location: [facility];  Service: Endoscopy;  Laterality: N/A;   GROIN MASS OPEN BIOPSY  2004   IR IVC FILTER PLMT / S&I /IMG GUID/MOD SED  03/14/2018   IR PARACENTESIS  03/10/2018   JOINT REPLACEMENT     LAPAROSCOPIC CHOLECYSTECTOMY     PERIPHERAL VASCULAR INTERVENTION Left 03/05/2018   Procedure: PERIPHERAL VASCULAR INTERVENTION;  Surgeon: Elam Dutch, MD;  Location: [place] [facility];  Service: Cardiovascular;  Laterality: Left;  Attempted unsuccess\ful Per Dr. Eden Lathe   PERIPHERAL VASCULAR INTERVENTION  03/08/2018   Procedure: PERIPHERAL VASCULAR INTERVENTION;  Surgeon: Waynetta Sandy, MD;  Location: [place] [facility];  Service: Cardiovascular;;  SMA Stent    REPLACEMENT TOTAL KNEE BILATERAL Bilateral 2012   REVERSE SHOULDER ARTHROPLASTY  02/26/2012   Procedure: REVERSE SHOULDER ARTHROPLASTY;  Surgeon: Marin Shutter, MD;  Location: [place];  Service: Orthopedics;  Laterality: Right;  RIGHT SHOULDER REVERSED ARTHROPLASTY   SHOULDER ARTHROSCOPY WITH SUBACROMIAL DECOMPRESSION Left 02/10/2013   Procedure: SHOULDER ARTHROSCOPY WITH SUBACROMIAL DECOMPRESSION DISTAL CLAVICLE RESECTION;  Surgeon: Marin Shutter, MD;  Location: [place];  Service: Orthopedics;  Laterality: Left;  DISTAL CLAVICLE RESECTION   TONSILLECTOMY     TOTAL HIP ARTHROPLASTY  2007   Right   TOTAL HIP ARTHROPLASTY Left 02/07/2020   Procedure: TOTAL HIP ARTHROPLASTY ANTERIOR APPROACH;  Surgeon: Paralee Cancel, MD;  Location: [facility];  Service: Orthopedics;  Laterality: Left;  70 mins   TRANSURETHRAL RESECTION OF BLADDER TUMOR N/A 05/31/2018   Procedure: TRANSURETHRAL RESECTION OF BLADDER TUMOR (TURBT);  Surgeon: Lucas Mallow, MD;  Location: [facility];  Service: Urology;  Laterality: N/A;   VISCERAL ANGIOGRAPHY N/A 03/05/2018    Procedure: VISCERAL ANGIOGRAPHY;  Surgeon: Elam Dutch, MD;  Location: [place] [facility];  Service: Cardiovascular;  Laterality: N/A;   VISCERAL ANGIOGRAPHY N/A 03/08/2018   Procedure: VISCERAL ANGIOGRAPHY;  Surgeon: Waynetta Sandy, MD;  Location: [place] [facility];  Service: Cardiovascular;  Laterality: N/A;    Allergies  Allergies  Allergen Reactions   Ativan [Lorazepam] Other (See Comments)    Confusion    Pitavastatin Itching    Reaction to Livalo   Ropinirole Nausea Only and Other (See Comments)    Makes legs jump, also   Zofran [Ondansetron Hcl] Nausea Only   Zolpidem Tartrate Anxiety and Other (See Comments)    CONFUSION    Penicillins Rash and Other (See Comments)    Tolerated cephalosporins in past Has patient had a PCN reaction causing  immediate rash, facial/tongue/throat swelling, SOB or lightheadedness with hypotension: Yes Has patient had a PCN reaction causing severe rash involving mucus membranes or skin necrosis: No Has patient had a PCN reaction that required hospitalization: No Has patient had a PCN reaction occurring within the last 10 years: No If all of the above answers are "NO", then may proceed with Cephalosporin use.     History of Present Illness    [name] is a 80 y.o. female with a hx of CAD s/p CABG 2004 with DES to LM 7017, chronic diastolic heart failure, PAD, recurrent DVT/PE, GI bleeding on anticoagulation, COPD, PAF in postoperative setting, GERD, HLD, HTN, renal insufficiency last seen while hospitalized.Marland Kitchen  Hospitalized 04/29/21-05/04/21 with COPD exacerbation and diastolic heart failure exacerbatoin. Treated with IV steroid, IV lasix, doxycycline, and discharged on predisone taper.   She was seen in clinic 05/10/21 noting persistent edema and shortness of breath. She was recommended for evaluation in the emergency department. Hospitalized 05/10/21-05/13/21 with recurrent hypoxic respiratory failure due to concurrent  heart failure and COPD exacerbation in setting of medication and dietary noncompliance. She had elevated d-dimer but CTA negative for PE and bilateral LE edema without DVT. Her Celexa was held due to QT prolongation. Discharge weight 74 kg (162.8lbs). Her weight today is 77.5kg (170.5lbs).  Presents today for follow up with her sister. Her sister drives from [place] to [place] to drive her to [facility] appointment, they are interested in transitioning care to our [place] office. Her chief complaint today is generalized body aches which are managed by oxycodone prescribed by her primary care provider. No chest pain, pressure, tightness. Notes worsening shortness of breath and lower extremity edema. No orthopnea, PND. She lives alone and most of her meals are preprepared or brought by family members. She has been keeping a log of her weight since hospital discharge. She has been compliant with her Lasix 80mg  in the morning daily.   EKGs/Labs/Other Studies Reviewed:   The following studies were reviewed today:  Echo 10/09/20  1. Diastology suggestive of impaired LV filling. Left ventricular  ejection fraction, by estimation, is 60 to 65%. The left ventricle has  normal function. The left ventricle has no regional wall motion  abnormalities. There is moderate concentric left  ventricular hypertrophy. Left ventricular diastolic parameters are  indeterminate.   2. Right ventricular systolic function is low normal. The right  ventricular size is normal. There is normal pulmonary artery systolic  pressure.   3. Left atrial size was moderately dilated.   4. Right atrial size was moderately dilated.   5. The mitral valve is myxomatous. No evidence of mitral valve  regurgitation. The mean mitral valve gradient is 5.0 mmHg with average  heart rate of 87 bpm. Moderate mitral annular calcification.   6. The aortic valve is tricuspid. Aortic valve regurgitation is not  visualized.    7. The inferior vena cava is dilated in size with <50% respiratory  variability, suggesting right atrial pressure of 15 mmHg.   EKG:  No EKG today  Recent Labs: 09/10/2020: TSH 4.060 03/19/2021: NT-Pro BNP 1,355 05/02/2021: ALT 22 05/10/2021: B Natriuretic Peptide 471.5 05/12/2021: Hemoglobin 11.2; Platelets 226 05/13/2021: BUN 22; Creatinine, Ser 1.19; Magnesium 2.0; Potassium 4.8; Sodium 134  Recent Lipid Panel    Component Value Date/Time   CHOL 81 05/10/2021 2305   CHOL 89 (L) 03/08/2020 1158   CHOL 98 05/23/2014 0857   TRIG 22 05/10/2021 2305   TRIG 83 05/23/2014  0857   HDL 47 05/10/2021 2305   HDL 37 (L) 03/08/2020 1158   HDL 35 (L) 05/23/2014 0857   CHOLHDL 1.7 05/10/2021 2305   VLDL 4 05/10/2021 2305   LDLCALC 30 05/10/2021 2305   LDLCALC 36 03/08/2020 1158   LDLCALC 46 05/23/2014 0857   Home Medications   Current Meds  Medication Sig   albuterol (PROVENTIL) (2.5 MG/3ML) 0.083% nebulizer solution Take 2.5 mg by nebulization every 6 (six) hours as needed for wheezing or shortness of breath.   allopurinol (ZYLOPRIM) 100 MG tablet Take 100 mg by mouth daily with breakfast.   ARIPiprazole (ABILIFY) 2 MG tablet Take 2 mg by mouth daily with breakfast.   aspirin EC 81 MG tablet Take 81 mg by mouth daily with breakfast. Swallow whole.   atorvastatin (LIPITOR) 20 MG tablet Take 20 mg by mouth daily with supper.   cholecalciferol (VITAMIN D3) 25 MCG (1000 UNIT) tablet Take 1,000 Units by mouth daily with supper.   cyclobenzaprine (FLEXERIL) 10 MG tablet Take 10 mg by mouth daily.   ferrous sulfate 325 (65 FE) MG tablet Take 325 mg by mouth daily with breakfast.   furosemide (LASIX) 80 MG tablet Take 1 tablet (80 mg total) by mouth daily.   gabapentin (NEURONTIN) 100 MG capsule Take 100 mg by mouth daily with breakfast.   Ipratropium-Albuterol (COMBIVENT RESPIMAT) 20-100 MCG/ACT AERS respimat Inhale 1 puff into the lungs 3 (three) times daily as needed for wheezing or shortness of  breath.   levothyroxine (SYNTHROID) 50 MCG tablet Take 1 tablet (50 mcg total) by mouth daily at 6 (six) AM.   magnesium gluconate (MAGONATE) 500 MG tablet Take 500 mg by mouth daily.   Melatonin 10 MG TABS Take 30 mg by mouth at bedtime.   nitroGLYCERIN (NITROSTAT) 0.4 MG SL tablet Place 1 tablet (0.4 mg total) under the tongue every 5 (five) minutes as needed for chest pain. For chest pain   omeprazole (PRILOSEC) 40 MG capsule TAKE 1 CAPSULE BY MOUTH 2 TIMES DAILY.   OXYGEN Inhale 4 L/min into the lungs as needed (DURING ALL TIMES OF EXERTION).    pilocarpine (PILOCAR) 1 % ophthalmic solution Place 1 drop into both eyes 2 (two) times daily.   potassium chloride SA (KLOR-CON) 20 MEQ tablet Take 20 mEq by mouth daily with lunch.   pramipexole (MIRAPEX) 0.25 MG tablet Take 0.25 mg by mouth daily with supper.   roflumilast (DALIRESP) 500 MCG TABS tablet Take 500 mcg by mouth daily with breakfast.   spironolactone (ALDACTONE) 25 MG tablet Take 0.5 tablets (12.5 mg total) by mouth daily.   traZODone (DESYREL) 50 MG tablet Take 150 mg by mouth at bedtime.    vitamin C (ASCORBIC ACID) 500 MG tablet Take 500 mg by mouth daily with lunch.   XTAMPZA ER 9 MG C12A Take 9 mg by mouth 2 (two) times daily.     Review of Systems    All other systems reviewed and are otherwise negative except as noted above.  Physical Exam    VS:  BP 110/70   Pulse 94   Ht 5\' 8"  (1.727 m)   Wt 170 lb 12.8 oz (77.5 kg)   SpO2 93%   BMI 25.97 kg/m  , BMI Body mass index is 25.97 kg/m.  Wt Readings from Last 3 Encounters:  06/20/21 170 lb 12.8 oz (77.5 kg)  06/03/21 172 lb 3.2 oz (78.1 kg)  05/13/21 163 lb 3.2 oz (74 kg)  GEN: Well nourished, well developed, in no acute distress. HEENT: normal. Neck: Supple, no JVD, carotid bruits, or masses. Cardiac: RR, no murmurs, rubs, or gallops. No clubbing, cyanosis. Bilateral 1+ pitting edema..  Radials/DP/PT 2+ and equal bilaterally.  Respiratory:  Respirations  regular and unlabored, clear to auscultation bilaterally. GI: Soft, nontender, nondistended. MS: No deformity or atrophy. Skin: Warm and dry, no rash. Neuro:  Strength and sensation are intact. Psych: Normal affect.  Assessment & Plan    COPD - No signs of acute exacerbation. Continue to follow with pulmonology.   Chronic diastolic heart failure - Weight up 8 pounds since hospital discharge. Reiterated importance of low salt diet and fluid restriction. Increase Lasix to 80mg  twice dialy for 3 days, then return to 80mg  daily. BMP, ProBNP, CBC today. Additional GDMT includes Spironolactone 12.5mg  daily. Pending lab results and response to diuresis, consider increased dose Spironolactone or addition of SGLT2i.  CAD - Last intervention 2010. Normal myoview 2018. Stable with no anginal symptoms. No indication for ischemic evaluation. GDMT includes aspirin, atorvastatin, PRN nitroglycerin. Heart healthy diet and regular cardiovascular exercise encouraged.   HLD - Continue Atorvastatin.   History of DVT/PE  Postoperative atrial fibrillation after CABG - She is not anticoagulated due to history of GI bleed.   HTN - BP well controlled. BP low normal today though with no symptoms of lightheadness nor dizziness. Continue current antihypertensive regimen.   Disposition: Follow up in 2 month(s) - wishes to transition to [place] office - staff message sent to Dr. Harrington Challenger and Dr. Bettina Gavia for approval   Signed, Loel Dubonnet, NP 06/20/2021, 7:38 PM [place]

## 2021-06-20 NOTE — Patient Instructions (Addendum)
Medication Instructions:  Your physician has recommended you make the following change in your medication:   CHANGE Furosemide to 80mg  twice per day for 3 days  Sunday return to Furosemide 80mg  once per day  *If you need a refill on your cardiac medications before your next appointment, please call your pharmacy*   Lab Work: Your physician recommends that you return for lab work today: CBC with differential, CMP, BNP  If you have labs (blood work) drawn today and your tests are completely normal, you will receive your results only by: [place] (if you have MyChart) OR A paper copy in the mail If you have any lab test that is abnormal or we need to change your treatment, we will call you to review the results.   Testing/Procedures: We will schedule your echocardiogram as ordered by pulmonology at the [facility] office.    Follow-Up: At [facility], you and your health needs are our priority.  As part of our continuing mission to provide you with exceptional heart care, we have created designated Provider Care Teams.  These Care Teams include your primary Cardiologist (physician) and Advanced Practice Providers (APPs -  Physician Assistants and Nurse Practitioners) who all work together to provide you with the care you need, when you need it.  We recommend signing up for the patient portal called "MyChart".  Sign up information is provided on this After Visit Summary.  MyChart is used to connect with patients for Virtual Visits (Telemedicine).  Patients are able to view lab/test results, encounter notes, upcoming appointments, etc.  Non-urgent messages can be sent to your provider as well.   To learn more about what you can do with MyChart, go to [email or web address].    Your next appointment:   2 month(s)  The format for your next appointment:   In Person  Provider:   We will reach out to Dr. Harrington Challenger about getting you to see Dr. Bettina Gavia in the [facility] office   Other  Instructions  Recommend weighing daily and keeping a log. Please call our office if you have weight gain of 2 pounds overnight or 5 pounds in 1 week.   To prevent or reduce lower extremity swelling: Eat a low salt diet. Salt makes the body hold onto extra fluid which causes swelling. Sit with legs elevated. For example, in the recliner or on an [place].  Wear knee-high compression stockings during the daytime. Ones labeled 15-20 mmHg provide good compression.  Heart Healthy Diet Recommendations: A low-salt diet is recommended. Meats should be grilled, baked, or boiled. Avoid fried foods. Focus on lean protein sources like fish or chicken with vegetables and fruits. The American Heart Association is a Microbiologist!  American Heart Association Diet and Lifeystyle Recommendations   Exercise recommendations: The American Heart Association recommends 150 minutes of moderate intensity exercise weekly. Try 30 minutes of moderate intensity exercise 4-5 times per week. This could include walking, jogging, or swimming.

## 2021-06-21 ENCOUNTER — Telehealth: Payer: Self-pay | Admitting: Cardiology

## 2021-06-21 LAB — CBC WITH DIFFERENTIAL/PLATELET
Basophils Absolute: 0.1 10*3/uL (ref 0.0–0.2)
Basos: 1 %
EOS (ABSOLUTE): 0.1 10*3/uL (ref 0.0–0.4)
Eos: 1 %
Hematocrit: 34.9 % (ref 34.0–46.6)
Hemoglobin: 11.5 g/dL (ref 11.1–15.9)
Immature Grans (Abs): 0.1 10*3/uL (ref 0.0–0.1)
Immature Granulocytes: 1 %
Lymphocytes Absolute: 1.6 10*3/uL (ref 0.7–3.1)
Lymphs: 19 %
MCH: 28.3 pg (ref 26.6–33.0)
MCHC: 33 g/dL (ref 31.5–35.7)
MCV: 86 fL (ref 79–97)
Monocytes Absolute: 0.8 10*3/uL (ref 0.1–0.9)
Monocytes: 9 %
Neutrophils Absolute: 5.9 10*3/uL (ref 1.4–7.0)
Neutrophils: 69 %
Platelets: 235 10*3/uL (ref 150–450)
RBC: 4.07 x10E6/uL (ref 3.77–5.28)
RDW: 15.9 % — ABNORMAL HIGH (ref 11.7–15.4)
WBC: 8.5 10*3/uL (ref 3.4–10.8)

## 2021-06-21 LAB — COMPREHENSIVE METABOLIC PANEL
ALT: 3 IU/L (ref 0–32)
AST: 11 IU/L (ref 0–40)
Albumin/Globulin Ratio: 1.4 (ref 1.2–2.2)
Albumin: 3.9 g/dL (ref 3.7–4.7)
Alkaline Phosphatase: 68 IU/L (ref 44–121)
BUN/Creatinine Ratio: 14 (ref 12–28)
BUN: 15 mg/dL (ref 8–27)
Bilirubin Total: 0.4 mg/dL (ref 0.0–1.2)
CO2: 26 mmol/L (ref 20–29)
Calcium: 8.7 mg/dL (ref 8.7–10.3)
Chloride: 100 mmol/L (ref 96–106)
Creatinine, Ser: 1.11 mg/dL — ABNORMAL HIGH (ref 0.57–1.00)
Globulin, Total: 2.8 g/dL (ref 1.5–4.5)
Glucose: 100 mg/dL — ABNORMAL HIGH (ref 65–99)
Potassium: 4.2 mmol/L (ref 3.5–5.2)
Sodium: 141 mmol/L (ref 134–144)
Total Protein: 6.7 g/dL (ref 6.0–8.5)
eGFR: 50 mL/min/{1.73_m2} — ABNORMAL LOW (ref >59)

## 2021-06-21 LAB — PRO B NATRIURETIC PEPTIDE: NT-Pro BNP: 2182 pg/mL — ABNORMAL HIGH (ref 0–738)

## 2021-06-21 NOTE — Telephone Encounter (Signed)
-----   Message from Gita Kudo, RN sent at 06/21/2021  7:54 AM EDT -----  ----- Message ----- From: Loel Dubonnet, NP Sent: 06/20/2021   8:46 PM EDT To: Rebeca Alert Ash/Hp Scheduling  Patient is transitioning from Dr. Harrington Challenger in [place] to Dr. Bettina Gavia in [place]. I have confirmed with both physicians that they are okay with this switch.   Please schedule for [facility] office visit with Dr. Bettina Gavia early August 2022. Appt notes 'establish with Dr. Bettina Gavia - HF follow up'.  She also has an echocardiogram which was ordered by pulmonology. If we could please coordinate to have that done next-available in the [facility] office as well. It is easier for her than transportation to [place]. If I need to reorder the testing, let me know.   Please let me know if you have questions. Thank you so much!  Loel Dubonnet, NP

## 2021-06-21 NOTE — Telephone Encounter (Signed)
LVM for pt to call and schedule an appt with [facility] and to schedule an echo/kbl 06/21/2021

## 2021-06-24 DIAGNOSIS — J449 Chronic obstructive pulmonary disease, unspecified: Secondary | ICD-10-CM | POA: Diagnosis not present

## 2021-06-25 NOTE — Telephone Encounter (Signed)
LVM for pt to call and schedule an appt with [facility] and to schedule an echo/kbl 06/25/2021

## 2021-06-26 DIAGNOSIS — K219 Gastro-esophageal reflux disease without esophagitis: Secondary | ICD-10-CM | POA: Diagnosis not present

## 2021-06-26 DIAGNOSIS — J439 Emphysema, unspecified: Secondary | ICD-10-CM | POA: Diagnosis not present

## 2021-06-26 DIAGNOSIS — J9621 Acute and chronic respiratory failure with hypoxia: Secondary | ICD-10-CM | POA: Diagnosis not present

## 2021-06-26 DIAGNOSIS — I110 Hypertensive heart disease with heart failure: Secondary | ICD-10-CM | POA: Diagnosis not present

## 2021-06-26 DIAGNOSIS — M545 Low back pain, unspecified: Secondary | ICD-10-CM | POA: Diagnosis not present

## 2021-06-26 DIAGNOSIS — K5520 Angiodysplasia of colon without hemorrhage: Secondary | ICD-10-CM | POA: Diagnosis not present

## 2021-06-26 DIAGNOSIS — I25118 Atherosclerotic heart disease of native coronary artery with other forms of angina pectoris: Secondary | ICD-10-CM | POA: Diagnosis not present

## 2021-06-26 DIAGNOSIS — I5033 Acute on chronic diastolic (congestive) heart failure: Secondary | ICD-10-CM | POA: Diagnosis not present

## 2021-06-26 DIAGNOSIS — G8929 Other chronic pain: Secondary | ICD-10-CM | POA: Diagnosis not present

## 2021-06-28 ENCOUNTER — Telehealth: Payer: Self-pay | Admitting: *Deleted

## 2021-06-28 NOTE — Telephone Encounter (Signed)
ATC patient x1, left vm to return all to clarify if she had the lab work and the echo done at another facility as I do not see any results.

## 2021-07-01 ENCOUNTER — Ambulatory Visit: Payer: Medicare HMO | Admitting: Adult Health

## 2021-07-02 ENCOUNTER — Telehealth: Payer: Self-pay

## 2021-07-02 DIAGNOSIS — I5032 Chronic diastolic (congestive) heart failure: Secondary | ICD-10-CM

## 2021-07-02 DIAGNOSIS — Z79899 Other long term (current) drug therapy: Secondary | ICD-10-CM

## 2021-07-02 DIAGNOSIS — I10 Essential (primary) hypertension: Secondary | ICD-10-CM

## 2021-07-02 MED ORDER — SPIRONOLACTONE 25 MG PO TABS: 25.0000 mg | ORAL_TABLET | Freq: Every day | ORAL | 3 refills | Status: DC

## 2021-07-02 NOTE — Telephone Encounter (Signed)
-----   Message from Loel Dubonnet, NP sent at 06/21/2021  7:56 AM EDT ----- Stable kidney function. Normal electrolytes. CBC shows improving anemia, hemogobin now normal. No evidence of infection. ProBNP shows volume overload as seen in clinic. Continue with plan for Lasix 80mg  BID x 3 days, then return to Lasix 80mg  QD. Additionally, recommend increase Spironolactone to 25mg  QD.   Recommend repeat BMP, proBNP in 1 week. Please facilitate at the [facility] office.

## 2021-07-02 NOTE — Telephone Encounter (Signed)
Spoke with the pts granddaughter, Lilia Pro, her dad is on DPR and she manages the pts meds.   She verbalized understanding of the lab results and recommendations.   They will keep the 07/23/21 appt with [name] PA even though they will be following up in [place] in the future since they will be coming to [place] for an appt that day also to see Patricia Nettle with Pulmonary.   They will have labs in [place] next week.   They will call back if the pt develops any problems or they develop any further questions.

## 2021-07-03 NOTE — Telephone Encounter (Signed)
LVM for pt to call and schedule with [place]/kbl 07/03/2021 Will mail letter in attempt to contact.

## 2021-07-08 DIAGNOSIS — J449 Chronic obstructive pulmonary disease, unspecified: Secondary | ICD-10-CM | POA: Diagnosis not present

## 2021-07-09 DIAGNOSIS — I5032 Chronic diastolic (congestive) heart failure: Secondary | ICD-10-CM | POA: Diagnosis not present

## 2021-07-09 DIAGNOSIS — Z79899 Other long term (current) drug therapy: Secondary | ICD-10-CM | POA: Diagnosis not present

## 2021-07-09 DIAGNOSIS — I10 Essential (primary) hypertension: Secondary | ICD-10-CM | POA: Diagnosis not present

## 2021-07-10 ENCOUNTER — Other Ambulatory Visit: Payer: Self-pay

## 2021-07-10 LAB — PRO B NATRIURETIC PEPTIDE: NT-Pro BNP: 3548 pg/mL — ABNORMAL HIGH (ref 0–738)

## 2021-07-10 LAB — BASIC METABOLIC PANEL
BUN/Creatinine Ratio: 11 — ABNORMAL LOW (ref 12–28)
BUN: 17 mg/dL (ref 8–27)
CO2: 31 mmol/L — ABNORMAL HIGH (ref 20–29)
Calcium: 9.8 mg/dL (ref 8.7–10.3)
Chloride: 100 mmol/L (ref 96–106)
Creatinine, Ser: 1.5 mg/dL — ABNORMAL HIGH (ref 0.57–1.00)
Glucose: 117 mg/dL — ABNORMAL HIGH (ref 65–99)
Potassium: 4.5 mmol/L (ref 3.5–5.2)
Sodium: 144 mmol/L (ref 134–144)
eGFR: 35 mL/min/{1.73_m2} — ABNORMAL LOW (ref >59)

## 2021-07-10 NOTE — Patient Outreach (Signed)
[name] [facility]) Care Management  [place]  07/10/2021   [name] August 16, 1941 [id number]  Subjective: Telephone call to patient for follow up. Patient reports she is doing good. Staying at home all the time now with her family coming several times a day.  Weight 171 lbs.  Continued discussion on heart failure management. She voices no concerns.   Objective:   Encounter Medications:  Outpatient Encounter Medications as of 07/10/2021  Medication Sig   albuterol (PROVENTIL) (2.5 MG/3ML) 0.083% nebulizer solution Take 2.5 mg by nebulization every 6 (six) hours as needed for wheezing or shortness of breath.   allopurinol (ZYLOPRIM) 100 MG tablet Take 100 mg by mouth daily with breakfast.   ARIPiprazole (ABILIFY) 2 MG tablet Take 2 mg by mouth daily with breakfast.   aspirin EC 81 MG tablet Take 81 mg by mouth daily with breakfast. Swallow whole.   atorvastatin (LIPITOR) 20 MG tablet Take 20 mg by mouth daily with supper.   cholecalciferol (VITAMIN D3) 25 MCG (1000 UNIT) tablet Take 1,000 Units by mouth daily with supper.   cyclobenzaprine (FLEXERIL) 10 MG tablet Take 10 mg by mouth daily.   ferrous sulfate 325 (65 FE) MG tablet Take 325 mg by mouth daily with breakfast.   furosemide (LASIX) 80 MG tablet Take 1 tablet (80 mg total) by mouth daily.   gabapentin (NEURONTIN) 100 MG capsule Take 100 mg by mouth daily with breakfast.   Ipratropium-Albuterol (COMBIVENT RESPIMAT) 20-100 MCG/ACT AERS respimat Inhale 1 puff into the lungs 3 (three) times daily as needed for wheezing or shortness of breath.   levothyroxine (SYNTHROID) 50 MCG tablet Take 1 tablet (50 mcg total) by mouth daily at 6 (six) AM.   magnesium gluconate (MAGONATE) 500 MG tablet Take 500 mg by mouth daily.   Melatonin 10 MG TABS Take 30 mg by mouth at bedtime.   nitroGLYCERIN (NITROSTAT) 0.4 MG SL tablet Place 1 tablet (0.4 mg total) under the tongue every 5 (five) minutes as needed for chest  pain. For chest pain   omeprazole (PRILOSEC) 40 MG capsule TAKE 1 CAPSULE BY MOUTH 2 TIMES DAILY.   OXYGEN Inhale 4 L/min into the lungs as needed (DURING ALL TIMES OF EXERTION).    pilocarpine (PILOCAR) 1 % ophthalmic solution Place 1 drop into both eyes 2 (two) times daily.   potassium chloride SA (KLOR-CON) 20 MEQ tablet Take 20 mEq by mouth daily with lunch.   pramipexole (MIRAPEX) 0.25 MG tablet Take 0.25 mg by mouth daily with supper.   roflumilast (DALIRESP) 500 MCG TABS tablet Take 500 mcg by mouth daily with breakfast.   spironolactone (ALDACTONE) 25 MG tablet Take 1 tablet (25 mg total) by mouth daily.   traZODone (DESYREL) 50 MG tablet Take 150 mg by mouth at bedtime.    vitamin C (ASCORBIC ACID) 500 MG tablet Take 500 mg by mouth daily with lunch.   XTAMPZA ER 9 MG C12A Take 9 mg by mouth 2 (two) times daily.   No facility-administered encounter medications on file as of 07/10/2021.    Functional Status:  In your present state of health, do you have any difficulty performing the following activities: 05/11/2021 04/29/2021  Hearing? N N  Vision? N N  Difficulty concentrating or making decisions? Y Y  Comment - -  Walking or climbing stairs? Y Y  Comment - -  Dressing or bathing? N N  Comment - -  Doing errands, shopping? Y Y  Comment - -  Preparing Food and eating ? - -  Comment - -  Using the Toilet? - -  In the past six months, have you accidently leaked urine? - -  Do you have problems with loss of bowel control? - -  Managing your Medications? - -  Comment - -  Managing your Finances? - -  Comment - -  Housekeeping or managing your Housekeeping? - -  Comment - -  Some recent data might be hidden    Fall/Depression Screening: Fall Risk  02/20/2021 01/10/2021 11/07/2020  Falls in the past year? 0 0 0  Comment - - -  Number falls in past yr: - - -  Injury with Fall? - - -  Risk for fall due to : - - -  Follow up - - -   PHQ 2/9 Scores 01/10/2021 11/07/2020  08/15/2020 03/30/2020 10/11/2019 10/12/2018 05/13/2018  PHQ - 2 Score 0 0 1 0 2 0 6  PHQ- 9 Score - - - - 17 - 20    Assessment:   Care Plan Care Plan : Heart Failure (Adult)  Updates made by Jon Billings, RN since 07/10/2021 12:00 AM     Problem: Symptom Exacerbation (Heart Failure)      Long-Range Goal: Symptom Exacerbation Prevented or Minimized as evidenced by no exacerbation of heart failure   Start Date: 11/07/2020  Expected End Date: 12/21/2021  This Visit's Progress: On track  Recent Progress: On track  Priority: High  Note:   Evidence-based guidance:  Establish a mutually-agreed-upon early intervention process to communicate  with primary care provider when signs/symptoms worsen.  Notes:     Task: Identify and Minimize Risk of Heart Failure Exacerbation   Due Date: 12/21/2021  Priority: Routine  Responsible User: Jon Billings, RN  Note:   Care Management Activities:    - healthy lifestyle promoted - rescue (action) plan reviewed - self-awareness of signs/symptoms of worsening disease encouraged    Notes: 02/20/21 patient able to reports when to notify physician. 03/22/21 Patient weight today 170 lbs. Patient states she has notified the doctor of her weights and is to call today with update.  Patient with increase of lasix recently.  Discussed diet of low salt.  05/06/21 Patient with recent heart failure exacerbation.  Weight today 168 lbs. Patient has follow ups to schedule with heart doctor and PCP.  Family assists with appointments.   06/19/21 Patient weight 170 lbs.  Family active with care. Patient stays with son and family at night. Daughter in law does the cooking. Patient reports shortness of breath not as bad but feels she is in a good place.  Discussed palliative care with patient and what that means.   Heart Failure Management Re-enforced Please weight daily or as ordered by your doctor Report to your doctor weight gain of 2 -3 pounds in a day or 5 pounds in a  week Limit salt intake Monitor for shortness of breath, swelling of feet, ankles or abdomen and weight gain. Never use the salt shaker.  Read all food labels and avoid canned, processed and pickled foods.   Follow your doctor's recommendations for daily salt intake.  07/10/21 Patient weight 171 lbs.  Continued education about heart failure management. Patient knows to call the cardiologist for changes such as weight gain, increased shortness of breath, or swelling.       Goals Addressed               This Visit's Progress  THN-Track and Manage Fluids and Swelling (pt-stated)   On track     Barriers: Knowledge Timeframe:  Long-Range Goal Priority:  High Start Date:   12/20/20                          Expected End Date:12/21/21 Follow up 08/21/21  - use salt in moderation - watch for swelling in feet, ankles and legs every day - weigh myself daily    Why is this important?   It is important to check your weight daily and watch how much salt and liquids you have.  It will help you to manage your heart failure.    Notes: Patient weighs daily. 12/20/20 173 lbs 01/10/21- 171 lbs. 02/20/21 patient weight 165 lbs  03/22/21 patient weight today 170 lbs but patient reports weight up to 176 lbs earlier this week and MD increased her lasix. Patient states she has to call to report her weight today.  Reiterated the importance of a low salt diet.   05/06/21 Patient weight 168 lbs.  Reiterated heart failure management.   06/19/21 Patient weight 170 lbs.   Heart Failure Management Re-inforced Please weight daily or as ordered by your doctor Report to your doctor weight gain of 2 -3 pounds in a day or 5 pounds in a week Limit salt intake Monitor for shortness of breath, swelling of feet, ankles or abdomen and weight gain. Never use the salt shaker.  Read all food labels and avoid canned, processed and pickled foods.   Follow your doctor's recommendations for daily salt intake.  07/10/21  Patient weight 171 lbs.  Continued education about heart failure management. Patient knows to call the cardiologist for changes such as weight gain, increased shortness of breath, or swelling.         Plan:  Follow-up: Patient agrees to Care Plan and Follow-up. Follow-up in 4-6 week(s)  [name], RN, MSN [place] [facility] Care Management Coordinator Direct Line [phone] Cell [phone] Toll Free: [phone]  Fax: [phone]

## 2021-07-10 NOTE — Patient Instructions (Signed)
Goals Addressed               This Visit's Progress     THN-Track and Manage Fluids and Swelling (pt-stated)   On track     Barriers: Knowledge Timeframe:  Long-Range Goal Priority:  High Start Date:   12/20/20                          Expected End Date:12/21/21 Follow up 08/21/21  - use salt in moderation - watch for swelling in feet, ankles and legs every day - weigh myself daily    Why is this important?   It is important to check your weight daily and watch how much salt and liquids you have.  It will help you to manage your heart failure.    Notes: Patient weighs daily. 12/20/20 173 lbs 01/10/21- 171 lbs. 02/20/21 patient weight 165 lbs  03/22/21 patient weight today 170 lbs but patient reports weight up to 176 lbs earlier this week and MD increased her lasix. Patient states she has to call to report her weight today.  Reiterated the importance of a low salt diet.   05/06/21 Patient weight 168 lbs.  Reiterated heart failure management.   06/19/21 Patient weight 170 lbs.   Heart Failure Management Re-inforced Please weight daily or as ordered by your doctor Report to your doctor weight gain of 2 -3 pounds in a day or 5 pounds in a week Limit salt intake Monitor for shortness of breath, swelling of feet, ankles or abdomen and weight gain. Never use the salt shaker.  Read all food labels and avoid canned, processed and pickled foods.   Follow your doctor's recommendations for daily salt intake.  07/10/21 Patient weight 171 lbs.  Continued education about heart failure management. Patient knows to call the cardiologist for changes such as weight gain, increased shortness of breath, or swelling.

## 2021-07-11 DIAGNOSIS — J449 Chronic obstructive pulmonary disease, unspecified: Secondary | ICD-10-CM | POA: Diagnosis not present

## 2021-07-12 ENCOUNTER — Other Ambulatory Visit (HOSPITAL_COMMUNITY): Payer: Self-pay | Admitting: Physician Assistant

## 2021-07-12 ENCOUNTER — Other Ambulatory Visit: Payer: Self-pay

## 2021-07-12 ENCOUNTER — Ambulatory Visit (HOSPITAL_COMMUNITY)
Admission: RE | Admit: 2021-07-12 | Discharge: 2021-07-12 | Disposition: A | Discharge status: Home / Self Care | Payer: Medicare HMO | Source: Ambulatory Visit | Attending: Cardiovascular Disease | Admitting: Cardiovascular Disease

## 2021-07-12 ENCOUNTER — Ambulatory Visit: Payer: Self-pay

## 2021-07-12 DIAGNOSIS — I6523 Occlusion and stenosis of bilateral carotid arteries: Secondary | ICD-10-CM

## 2021-07-15 ENCOUNTER — Telehealth: Payer: Self-pay

## 2021-07-15 ENCOUNTER — Encounter: Payer: Self-pay | Admitting: Physician Assistant

## 2021-07-15 MED ORDER — FUROSEMIDE 80 MG PO TABS: ORAL_TABLET | ORAL | 3 refills | Status: DC

## 2021-07-15 NOTE — Telephone Encounter (Signed)
Called pt reviewed results and provider recommendations.  She reports that she is drinking less than 2L of fluids daily and she is eating a low salt diet.  She weighs herself daily and has occassionally noted an increase of 2 lbs or more overnight.  I advised her that on those days to take the additional afternoon dose of furosemide 80 mg PO PRN weight gain.  She verbalizes understanding. Order placed for furosemide 80 mg PO PRN weight gain.

## 2021-07-15 NOTE — Telephone Encounter (Signed)
-----   Message from Loel Dubonnet, NP sent at 07/10/2021  7:38 AM EDT ----- Lab work shows ProBNP (fluid volume) continues to increase. Please ensure drinking less than 2L of fluid and eating low salt diet. Kidney function shows slight decline compared to previous. Normal potassium. Ensure taking weight daily. Recommend Lasix '80mg'$  daily with additional '80mg'$  in the afternoon as needed of weight gain of 2 lbs overnight or 5 lbs in one week.

## 2021-07-23 ENCOUNTER — Ambulatory Visit (INDEPENDENT_AMBULATORY_CARE_PROVIDER_SITE_OTHER): Payer: Medicare HMO | Admitting: Physician Assistant

## 2021-07-23 ENCOUNTER — Other Ambulatory Visit: Payer: Self-pay

## 2021-07-23 ENCOUNTER — Ambulatory Visit (INDEPENDENT_AMBULATORY_CARE_PROVIDER_SITE_OTHER): Payer: Medicare HMO | Admitting: Adult Health

## 2021-07-23 ENCOUNTER — Encounter: Payer: Self-pay | Admitting: Physician Assistant

## 2021-07-23 ENCOUNTER — Encounter: Payer: Self-pay | Admitting: Adult Health

## 2021-07-23 VITALS — BP 140/64 | HR 105 | Ht 67.5 in | Wt 172.2 lb

## 2021-07-23 DIAGNOSIS — J9621 Acute and chronic respiratory failure with hypoxia: Secondary | ICD-10-CM | POA: Diagnosis not present

## 2021-07-23 DIAGNOSIS — I10 Essential (primary) hypertension: Secondary | ICD-10-CM

## 2021-07-23 DIAGNOSIS — I5032 Chronic diastolic (congestive) heart failure: Secondary | ICD-10-CM

## 2021-07-23 DIAGNOSIS — I700 Atherosclerosis of aorta: Secondary | ICD-10-CM | POA: Diagnosis not present

## 2021-07-23 DIAGNOSIS — N1831 Chronic kidney disease, stage 3a: Secondary | ICD-10-CM | POA: Diagnosis not present

## 2021-07-23 DIAGNOSIS — J449 Chronic obstructive pulmonary disease, unspecified: Secondary | ICD-10-CM | POA: Diagnosis not present

## 2021-07-23 DIAGNOSIS — Z86718 Personal history of other venous thrombosis and embolism: Secondary | ICD-10-CM | POA: Diagnosis not present

## 2021-07-23 DIAGNOSIS — J441 Chronic obstructive pulmonary disease with (acute) exacerbation: Secondary | ICD-10-CM

## 2021-07-23 DIAGNOSIS — K551 Chronic vascular disorders of intestine: Secondary | ICD-10-CM | POA: Diagnosis not present

## 2021-07-23 DIAGNOSIS — I25118 Atherosclerotic heart disease of native coronary artery with other forms of angina pectoris: Secondary | ICD-10-CM | POA: Diagnosis not present

## 2021-07-23 DIAGNOSIS — E782 Mixed hyperlipidemia: Secondary | ICD-10-CM | POA: Diagnosis not present

## 2021-07-23 MED ORDER — DAPAGLIFLOZIN PROPANEDIOL 10 MG PO TABS: 10.0000 mg | ORAL_TABLET | Freq: Every day | ORAL | 11 refills | Status: DC

## 2021-07-23 MED ORDER — TRELEGY ELLIPTA 200-62.5-25 MCG/INH IN AEPB: 1.0000 | INHALATION_SPRAY | Freq: Every day | RESPIRATORY_TRACT | 5 refills | Status: DC

## 2021-07-23 NOTE — Assessment & Plan Note (Signed)
Patient is prone to frequent COPD exacerbations.  She has severe COPD with high symptom burden.  Intolerant to [employer].  Would continue on triple therapy maintenance inhaler. We discussed home PT however she says that she has done this recently.  She does not appear able to do pulmonary rehab at this time. Will check ABG for possible hypercarbic component.  Consider nocturnal trilogy device if hypercarbia is present.  Have advised her on limiting her sedating medications if possible  Plan  Patient Instructions  Continue on TRELEGY 1 puff daily , rinse after use  Decrease Oxgyen 2l/m with activity and At bedtime   Activity as tolerated.  ABG as recommended.  Use extreme caution using sedating medications.  Combivent inhaler As needed  wheezing /shortness of breath .  Follow up with Dr. Chase Caller in 2 months and As needed   Please contact office for sooner follow up if symptoms do not improve or worsen or seek emergency care

## 2021-07-23 NOTE — Progress Notes (Signed)
$'@Patient'n$  ID: [name], female    DOB: [birth year], 80 y.o.   MRN: [id number]  Chief Complaint  Patient presents with   Follow-up    Referring provider: [name], MD  HPI: 80 year old female seen for pulmonary consult to establish for COPD and oxygen dependent respiratory failure June 03, 2021 Medical history significant for diastolic heart failure, diabetes and chronic kidney disease Chronic pain on daily narcotics. , CAD s/p CABG , PVD , PE on anticoagulation, OSA    TEST/EVENTS :  Spirometry in 2018 FEV1 0.86 L / 36%.  CT chest May 10, 2021 showed no pulmonary embolism.  Stable biapical pleural-parenchymal scarring.  07/23/2021 Follow up : COPD and O2 RF  Patient returns for a 6-week follow-up.  Patient was seen last visit for a pulmonary consult to establish for COPD and oxygen dependent respiratory failure.  She is prone to COPD exacerbations and decompensated congestive heart failure with acute on chronic respiratory failure.  She has had 2-3 hospitalizations over this past year. Last admission she was started on Trelegy inhaler.  She needs a refill of this.  She uses Combivent as needed.  She says overall breathing is doing better today but she has good days and bad days where she gets very short of breath with minimal activity.  Previous spirometry in 2018 showed FEV1 at 36%.  She is followed by cardiology and is on a maintenance dose for congestive heart failure.  She has chronic lower extremity swelling.  Is on diuretics.She was set up for a 2D echo and ABG unfortunately patient did not have these done.  She missed these appointments.  She does report that she has had sleep apnea in the past and has a CPAP machine but does not use this. Recent lab work does show an elevated BNP as patient has known congestive heart failure.  Patient was seen by cardiology this morning.  Previously was on Daliresp but was unable to tolerate and is no longer taking.  Patient has a  chronic pain syndrome and is on multiple sedating medications.  We discussed the dangers of daily narcotics and sedating medications. Patient has not been vaccinated for COVID-19.  She refuses COVID-vaccine.  Precautions were discussed.  Patient is on chronic oxygen.  Most recent hospitalization she was recommended to use 4 L of oxygen.  Today in the office O2 saturations were 96% on room air at rest and walking O2 saturations remained at 96% for 1 lap walking with no desaturations on room air. Patient denies any hemoptysis, chest pain, or fever    Allergies  Allergen Reactions   Ativan [Lorazepam] Other (See Comments)    Confusion    Pitavastatin Itching    Reaction to Livalo   Ropinirole Nausea Only and Other (See Comments)    Makes legs jump, also   Zofran [Ondansetron Hcl] Nausea Only   Zolpidem Tartrate Anxiety and Other (See Comments)    CONFUSION    Penicillins Rash and Other (See Comments)    Tolerated cephalosporins in past Has patient had a PCN reaction causing immediate rash, facial/tongue/throat swelling, SOB or lightheadedness with hypotension: Yes Has patient had a PCN reaction causing severe rash involving mucus membranes or skin necrosis: No Has patient had a PCN reaction that required hospitalization: No Has patient had a PCN reaction occurring within the last 10 years: No If all of the above answers are "NO", then may proceed with Cephalosporin use.     Immunization History  Administered  Date(s) Administered   Influenza, High Dose Seasonal PF 09/25/2020   Influenza-Unspecified 09/30/2018    Past Medical History:  Diagnosis Date   Anemia    bld. transfusion post lumbar surgery- 2012   Anxiety    Arthralgia    NOS   Blood transfusion 2012; 02/2018   WITH BACK SURGERY; "related to hematoma in stomach" (10/06/2018)   CAD (coronary artery disease)    s/p CABG 2004; s/p DES to LM in 2010;  [place] 10/29/11: EF 50-55%, mild elevated filling pressures, no pulmonary HTN,  LM 90% ISR, LAD and CFX occluded, S-RI occluded (old), S-OM3 ok and L-LAD ok, native nondominant RCA 95% -  med rx recommended ; Lexiscan Myoview 7/13 at [facility]: demonstrated "normal LV function, anterior attenuation and localized ischemia, inferior, basilar, mid section"   Carotid artery disease ([place])    Carotid US [id number]:  RICA [id number]; LICA [id number]; R subclavian stenosis - Repeat 1 year. // Carotid US 05/2019: R 1-39; L 40-59; R vertebral with atypical antegrade flow; R subclavian stenosis >> repeat 1 year // Carotid US 7/21: Bilat 40-59; L subclavian stenosis // Carotid US 7/22: Bilat ICA 40-59; R subclavian stenosis   CHF (congestive heart failure) ([place]) 01/21/2020   Chronic diastolic heart failure (HCC)    Echo 9/10: EF [id number], grade 1 diastolic dysfunction   Chronic lower back pain    COPD (chronic obstructive pulmonary disease) ([place])    Emphysema dxed by Dr. Woody Seller in [place] based on PFTs per pt in 2006; placed on albuterol   Depression    TAKES CELEXA  AND  (OFF- WELBUTRIN)   Diuretic-induced hypokalemia 10/08/2018   DVT of lower extremity (deep venous thrombosis) (HCC)    recurrent. bilateral (2 episodes)   Dyspnea    home o2 when needed   Dysrhythmia    afib with cabg   Exertional angina (HCC)    Treated with Isosorbide, Ranexa, amlodipine; intolerant to metoprolol   GERD (gastroesophageal reflux disease)    Gout    "on daily RX" (10/06/2018)   HLD (hyperlipidemia)    Hypertension    Hyperthyroidism    L Subclavian Artery Stenosis    Carotid US 7/21    Obesity (BMI 30-39.9) 2009   BMI 33   Osteoarthritis    "all over; hands" (10/06/2018)   Oxygen deficiency    Oxygen dependent    4L at home as needed (10/06/2018)   Pneumonia    PVD (peripheral vascular disease) ([place])    Sleep apnea 2012   USED CPAP THEN  STARTED USING SPIRIVA , Nov. 2013- last evaluation , changed from mask to aparatus that is just for her nose.. ; reports 05-28-18 "the mask smothers me so i dont use it  right now" (10/06/2018)    Tobacco History: Social History   Tobacco Use  Smoking Status Former   Packs/day: 1.00   Years: 45.00   Pack years: 45.00   Types: Cigarettes   Quit date: 12/22/1998   Years since quitting: 22.6  Smokeless Tobacco Never   Counseling given: Not Answered   Outpatient Medications Prior to Visit  Medication Sig Dispense Refill   albuterol (PROVENTIL) (2.5 MG/3ML) 0.083% nebulizer solution Take 2.5 mg by nebulization every 6 (six) hours as needed for wheezing or shortness of breath.     allopurinol (ZYLOPRIM) 100 MG tablet Take 100 mg by mouth daily with breakfast.     ARIPiprazole (ABILIFY) 2 MG tablet Take 2 mg by mouth daily with breakfast.  aspirin EC 81 MG tablet Take 81 mg by mouth daily with breakfast. Swallow whole.     atorvastatin (LIPITOR) 20 MG tablet Take 20 mg by mouth daily with supper.     cholecalciferol (VITAMIN D3) 25 MCG (1000 UNIT) tablet Take 1,000 Units by mouth daily with supper.     cyclobenzaprine (FLEXERIL) 10 MG tablet Take 10 mg by mouth daily.     dapagliflozin propanediol (FARXIGA) 10 MG TABS tablet Take 1 tablet (10 mg total) by mouth daily before breakfast. 30 tablet 11   ferrous sulfate 325 (65 FE) MG tablet Take 325 mg by mouth daily with breakfast.     furosemide (LASIX) 80 MG tablet Take 1 tablet (80 mg total) by mouth daily. 30 tablet 1   furosemide (LASIX) 80 MG tablet Take daily as needed in the afternoon for a weight gain of 2 pounds overnight or 5 pounds in 1 week 30 tablet 3   gabapentin (NEURONTIN) 100 MG capsule Take 100 mg by mouth daily with breakfast.     Ipratropium-Albuterol (COMBIVENT RESPIMAT) 20-100 MCG/ACT AERS respimat Inhale 1 puff into the lungs 3 (three) times daily as needed for wheezing or shortness of breath.     levothyroxine (SYNTHROID) 50 MCG tablet Take 1 tablet (50 mcg total) by mouth daily at 6 (six) AM. 90 tablet 3   magnesium gluconate (MAGONATE) 500 MG tablet Take 500 mg by mouth daily.      Melatonin 10 MG TABS Take 30 mg by mouth at bedtime.     nitroGLYCERIN (NITROSTAT) 0.4 MG SL tablet Place 1 tablet (0.4 mg total) under the tongue every 5 (five) minutes as needed for chest pain. For chest pain 25 tablet 3   omeprazole (PRILOSEC) 40 MG capsule TAKE 1 CAPSULE BY MOUTH 2 TIMES DAILY. 180 capsule 2   OXYGEN Inhale 4 L/min into the lungs as needed (DURING ALL TIMES OF EXERTION).      pilocarpine (PILOCAR) 1 % ophthalmic solution Place 1 drop into both eyes 2 (two) times daily.     potassium chloride SA (KLOR-CON) 20 MEQ tablet Take 20 mEq by mouth daily with lunch.     pramipexole (MIRAPEX) 0.25 MG tablet Take 0.25 mg by mouth daily with supper.     roflumilast (DALIRESP) 500 MCG TABS tablet Take 500 mcg by mouth daily with breakfast.     spironolactone (ALDACTONE) 25 MG tablet Take 1 tablet (25 mg total) by mouth daily. 90 tablet 3   traZODone (DESYREL) 50 MG tablet Take 150 mg by mouth at bedtime.      vitamin C (ASCORBIC ACID) 500 MG tablet Take 500 mg by mouth daily with lunch.     XTAMPZA ER 9 MG C12A Take 9 mg by mouth 2 (two) times daily.     No facility-administered medications prior to visit.     Review of Systems:   Constitutional:   No  weight loss, night sweats,  Fevers, chills,  +fatigue, or  lassitude.  HEENT:   No headaches,  Difficulty swallowing,  Tooth/dental problems, or  Sore throat,                No sneezing, itching, ear ache, nasal congestion, post nasal drip,   CV:  No chest pain,  Orthopnea, PND, swelling in lower extremities, anasarca, dizziness, palpitations, syncope.   GI  No heartburn, indigestion, abdominal pain, nausea, vomiting, diarrhea, change in bowel habits, loss of appetite, bloody stools.   Resp:   No chest wall  deformity  Skin: no rash or lesions.  GU: no dysuria, change in color of urine, no urgency or frequency.  No flank pain, no hematuria   MS:  No joint pain or swelling.  No decreased range of motion.  No back  pain.    Physical Exam  BP 130/70 (BP Location: Left Arm, Patient Position: Sitting, Cuff Size: Normal)   Pulse 85   Temp (!) 97.4 F (36.3 C) (Oral)   Ht 5' 7.5" (1.715 m)   Wt 171 lb 6.4 oz (77.7 kg)   SpO2 96%   BMI 26.45 kg/m   GEN: A/Ox3; pleasant , NAD    HEENT:  [place]/AT,   NOSE-clear, THROAT-clear, no lesions, no postnasal drip or exudate noted.   NECK:  Supple w/ fair ROM; no JVD; normal carotid impulses w/o bruits; no thyromegaly or nodules palpated; no lymphadenopathy.    RESP  Clear  P & A; w/o, wheezes/ rales/ or rhonchi. no accessory muscle use, no dullness to percussion  CARD:  RRR, no m/r/g, 1-2+ peripheral edema, pulses intact, no cyanosis or clubbing.  GI:   Soft & nt; nml bowel sounds; no organomegaly or masses detected.   Musco: Warm bil, no deformities or joint swelling noted.   Neuro: alert, no focal deficits noted.    Skin: Warm, no lesions or rashes    Lab Results:        PFT Results Latest Ref Rng & Units 09/29/2017  FVC-Pre L 1.71  FVC-Predicted Pre % 55  FVC-Post L 1.92  FVC-Predicted Post % 62  Pre FEV1/FVC % % 50  Post FEV1/FCV % % 52  FEV1-Pre L 0.86  FEV1-Predicted Pre % 36  FEV1-Post L 1.01  DLCO uncorrected ml/min/mmHg 16.04  DLCO UNC% % 56  DLCO corrected ml/min/mmHg 14.67  DLCO COR %Predicted % 51  DLVA Predicted % 83    No results found for: NITRICOXIDE      Assessment & Plan:   COPD exacerbation ([place]) Patient is prone to frequent COPD exacerbations.  She has severe COPD with high symptom burden.  Intolerant to [employer].  Would continue on triple therapy maintenance inhaler. We discussed home PT however she says that she has done this recently.  She does not appear able to do pulmonary rehab at this time. Will check ABG for possible hypercarbic component.  Consider nocturnal trilogy device if hypercarbia is present.  Have advised her on limiting her sedating medications if possible  Plan  Patient Instructions   Continue on TRELEGY 1 puff daily , rinse after use  Decrease Oxgyen 2l/m with activity and At bedtime   Activity as tolerated.  ABG as recommended.  Use extreme caution using sedating medications.  Combivent inhaler As needed  wheezing /shortness of breath .  Follow up with Dr. Chase Caller in 2 months and As needed   Please contact office for sooner follow up if symptoms do not improve or worsen or seek emergency care        Chronic diastolic heart failure ([place]) Does not appear to be in overt volume overload.  Continue with cardiology recommendations and follow-up.  Hold on repeat echo at this time.  Patient is to keep follow-up with cardiology as planned  Acute on chronic respiratory failure with hypoxia ([place]) Chronic hypoxic respiratory failure.  Patient's O2 was increased last admission.  She appears to be improved with decreased oxygen demands.  Today in the office O2 saturations were normal on room air.  For now we will continue  oxygen 2 L with activity as needed to maintain O2 saturations greater than 88 to 90%.  And continue on 2 L at bedtime.  Sleep apnea Reported obstructive sleep apnea-CPAP noncompliant.  Continue on nocturnal oxygen.  Check ABG for possible hypercarbic component and consider nocturnal trilogy device.  I spent   40 minutes dedicated to the care of this patient on the date of this encounter to include pre-visit review of records, face-to-face time with the patient discussing conditions above, post visit ordering of testing, clinical documentation with the electronic health record, making appropriate referrals as documented, and communicating necessary findings to members of the patients care team.    Rexene Edison, NP 07/23/2021

## 2021-07-23 NOTE — Progress Notes (Addendum)
Cardiology Office Note:    Date:  07/23/2021   ID:  [name], [place] [birth year], MRN [id number]  PCP:  [name], MD   [facility] Providers Cardiologist:  Dorris Carnes, MD Cardiology APP:  Sharmon Revere      Referring MD: [name], MD   Chief Complaint:  Follow-up (CHF)    Patient Profile:    [name] is a 80 y.o. female with:  Coronary artery disease S/p CABG 2004 S/p DES to LM in 2010 Myoview 9/18: dist ant and apical ischemia; low risk (HFpEF) heart failure with preserved ejection fraction  Carotid artery disease [place] 7/22: bilat ICA 40-59; R subclavian stenosis  Recurrent DVT/pulmonary embolism  anticoagulation DC'd in past due to GI bleed admx in 3/19 with multiple bilat PEs with L femoral DVT anticoagulation c/b large abd wall hemotoma >> s/p tx with PRBCs S/p IVC filter PAD S/p SMA stenting in 2019 by VVS Hypertension Hyperlipidemia  Chronic kidney disease  COPD Cirrhosis Aortic atherosclerosis Hx of post op atrial fibrillation   Prior CV studies: VAS US CAROTID DUPLEX BILATERAL 07/12/2021 Bilateral ICA 40-59; right subclavian stenosis  Echocardiogram 10/09/2020 EF 60-65, no RWMA, moderate LVH, impaired LV filling, low normal RVSF, moderate BAE, mean MV gradient 5 mmHg  Echocardiogram 10/05/2019 EF 55-60, mild LVH, pseudonormalization (grade 2 diastolic dysfunction), moderate BAE, moderate MAC, mild MR, trivial TR, RVSP 34.5   Carotid US 06/06/2019 R 1-39; L 40-59; right subclavian stenosis   Echocardiogram 11/09/2018 Mild concentric LVH, EF 60-65, normal wall motion, grade 1 diastolic dysfunction, severe MAC, trivial MR, mild BAE, trivial PI   Mesenteric vascular US 06/30/2018 Mesenteric: Normal Celiac artery , Splenic artery, Hepatic artery and Inferior Mesenteric artery findings. 70 to 99% stenosis in the superior mesenteric artery.   Mesenteric artery ultrasound 04/22/2018 FINAL  INTERPRETATION: Mesenteric: 70 to 99% stenosis in the superior mesenteric artery, based on limited Visualization.   Echo 04/03/2018 EF 55-60, normal wall motion, MAC   Abdominal and chest CTA 04/01/2018 IMPRESSION: 1. No evidence of pulmonary embolism. Interval resolution of previously seen left lower lobe pulmonary emboli. 2. Unchanged moderate right and trace left pleural effusions with adjacent bibasilar atelectasis. 3. Relatively unchanged large anterior abdominal wall hematoma involving the space of Retzius, measuring 11.0 x 16.7 x 12.6 cm. 4.  Aortic atherosclerosis (ICD10-I70.0).   Echo 03/02/2018 EF 55-60, normal wall motion, trivial AI, MAC, RVSP 30   Echo 09/21/2017 Mild LVH, EF 63-01, grade 1 diastolic dysfunction, mild AI, moderate to severe MAC, mild MR, mild BAE   Myoview 08/31/17 EF 65, small area of distal anterior and apical ischemia, cannot exclude shifting breast attenuation; low risk    History of Present Illness: I last saw Ms. Dahmer in 11/2019.  Since then, she has had several admissions with sepsis/pneumonia, COPD exacerbation and decompensated HF.  She was Dr. Harrington Challenger in May after a recent admission with AECOPD/AHF.  She was still doing poorly at that visit and was sent back to the hospital.  She was last seen in clinic by Laurann Montana, NP 06/20/21 after DC from the hospital.  Her weight was up at that visit and her furosemide was adjusted.  Notes in her chart indicate she wants to transition to our [place] office (Dr. Bettina Gavia) as this is closer to her home.  She returns for f/u.  She is here with her son.  She continues to have shortness of breath with minimal activities.  She has not had  any chest pain.  She has not had syncope.  She sleeps on 2 pillows.  Her lower extremity edema remains stable.  Weights have been stable.    Past Medical History:  Diagnosis Date   Anemia    bld. transfusion post lumbar surgery- 2012   Anxiety    Arthralgia    NOS   Blood  transfusion 2012; 02/2018   WITH BACK SURGERY; "related to hematoma in stomach" (10/06/2018)   CAD (coronary artery disease)    s/p CABG 2004; s/p DES to LM in 2010;  [place] 10/29/11: EF 50-55%, mild elevated filling pressures, no pulmonary HTN, LM 90% ISR, LAD and CFX occluded, S-RI occluded (old), S-OM3 ok and L-LAD ok, native nondominant RCA 95% -  med rx recommended ; Lexiscan Myoview 7/13 at [facility]: demonstrated "normal LV function, anterior attenuation and localized ischemia, inferior, basilar, mid section"   Carotid artery disease ([place])    Carotid US 2/77:  RICA 8-24; LICA 23-53; R subclavian stenosis - Repeat 1 year. // Carotid US 05/2019: R 1-39; L 40-59; R vertebral with atypical antegrade flow; R subclavian stenosis >> repeat 1 year // Carotid US 7/21: Bilat 40-59; L subclavian stenosis // Carotid US 7/22: Bilat ICA 40-59; R subclavian stenosis   CHF (congestive heart failure) ([place]) 01/21/2020   Chronic diastolic heart failure (HCC)    Echo 9/10: EF 61-44%, grade 1 diastolic dysfunction   Chronic lower back pain    COPD (chronic obstructive pulmonary disease) ([place])    Emphysema dxed by Dr. Woody Seller in [place] based on PFTs per pt in 2006; placed on albuterol   Depression    TAKES CELEXA  AND  (OFF- WELBUTRIN)   Diuretic-induced hypokalemia 10/08/2018   DVT of lower extremity (deep venous thrombosis) (HCC)    recurrent. bilateral (2 episodes)   Dyspnea    home o2 when needed   Dysrhythmia    afib with cabg   Exertional angina (HCC)    Treated with Isosorbide, Ranexa, amlodipine; intolerant to metoprolol   GERD (gastroesophageal reflux disease)    Gout    "on daily RX" (10/06/2018)   HLD (hyperlipidemia)    Hypertension    Hyperthyroidism    L Subclavian Artery Stenosis    Carotid US 7/21    Obesity (BMI 30-39.9) 2009   BMI 33   Osteoarthritis    "all over; hands" (10/06/2018)   Oxygen deficiency    Oxygen dependent    4L at home as needed (10/06/2018)   Pneumonia    PVD  (peripheral vascular disease) ([place])    Sleep apnea 2012   USED CPAP THEN  STARTED USING SPIRIVA , Nov. 2013- last evaluation , changed from mask to aparatus that is just for her nose.. ; reports 05-28-18 "the mask smothers me so i dont use it right now" (10/06/2018)    Current Medications: Current Meds  Medication Sig   albuterol (PROVENTIL) (2.5 MG/3ML) 0.083% nebulizer solution Take 2.5 mg by nebulization every 6 (six) hours as needed for wheezing or shortness of breath.   allopurinol (ZYLOPRIM) 100 MG tablet Take 100 mg by mouth daily with breakfast.   ARIPiprazole (ABILIFY) 2 MG tablet Take 2 mg by mouth daily with breakfast.   aspirin EC 81 MG tablet Take 81 mg by mouth daily with breakfast. Swallow whole.   atorvastatin (LIPITOR) 20 MG tablet Take 20 mg by mouth daily with supper.   cholecalciferol (VITAMIN D3) 25 MCG (1000 UNIT) tablet Take 1,000 Units by mouth daily  with supper.   cyclobenzaprine (FLEXERIL) 10 MG tablet Take 10 mg by mouth daily.   dapagliflozin propanediol (FARXIGA) 10 MG TABS tablet Take 1 tablet (10 mg total) by mouth daily before breakfast.   ferrous sulfate 325 (65 FE) MG tablet Take 325 mg by mouth daily with breakfast.   furosemide (LASIX) 80 MG tablet Take 1 tablet (80 mg total) by mouth daily.   furosemide (LASIX) 80 MG tablet Take daily as needed in the afternoon for a weight gain of 2 pounds overnight or 5 pounds in 1 week   gabapentin (NEURONTIN) 100 MG capsule Take 100 mg by mouth daily with breakfast.   Ipratropium-Albuterol (COMBIVENT RESPIMAT) 20-100 MCG/ACT AERS respimat Inhale 1 puff into the lungs 3 (three) times daily as needed for wheezing or shortness of breath.   levothyroxine (SYNTHROID) 50 MCG tablet Take 1 tablet (50 mcg total) by mouth daily at 6 (six) AM.   magnesium gluconate (MAGONATE) 500 MG tablet Take 500 mg by mouth daily.   Melatonin 10 MG TABS Take 30 mg by mouth at bedtime.   nitroGLYCERIN (NITROSTAT) 0.4 MG SL tablet Place 1 tablet  (0.4 mg total) under the tongue every 5 (five) minutes as needed for chest pain. For chest pain   omeprazole (PRILOSEC) 40 MG capsule TAKE 1 CAPSULE BY MOUTH 2 TIMES DAILY.   OXYGEN Inhale 4 L/min into the lungs as needed (DURING ALL TIMES OF EXERTION).    pilocarpine (PILOCAR) 1 % ophthalmic solution Place 1 drop into both eyes 2 (two) times daily.   potassium chloride SA (KLOR-CON) 20 MEQ tablet Take 20 mEq by mouth daily with lunch.   pramipexole (MIRAPEX) 0.25 MG tablet Take 0.25 mg by mouth daily with supper.   roflumilast (DALIRESP) 500 MCG TABS tablet Take 500 mcg by mouth daily with breakfast.   spironolactone (ALDACTONE) 25 MG tablet Take 1 tablet (25 mg total) by mouth daily.   traZODone (DESYREL) 50 MG tablet Take 150 mg by mouth at bedtime.    vitamin C (ASCORBIC ACID) 500 MG tablet Take 500 mg by mouth daily with lunch.   XTAMPZA ER 9 MG C12A Take 9 mg by mouth 2 (two) times daily.     Allergies:   Ativan [lorazepam], Pitavastatin, Ropinirole, Zofran [ondansetron hcl], Zolpidem tartrate, and Penicillins   Social History   Tobacco Use   Smoking status: Former    Packs/day: 1.00    Years: 45.00    Pack years: 45.00    Types: Cigarettes    Quit date: 12/22/1998    Years since quitting: 22.6   Smokeless tobacco: Never  Vaping Use   Vaping Use: Never used  Substance Use Topics   Alcohol use: Never    Alcohol/week: 0.0 standard drinks   Drug use: Never     Family Hx: The patient's family history includes COPD in her father; Colitis in her sister; Heart disease in her father and sister; Lung cancer in her father; Ovarian cancer in her mother; Stomach cancer in her sister.  Review of Systems  Respiratory:  Positive for cough (chronic).     EKGs/Labs/Other Test Reviewed:    EKG:  EKG is   ordered today.  The ekg ordered today demonstrates normal sinus rhythm, heart rate 89, multifocal P waves, PACs, QTC 493, IVCD, nonspecific ST-T wave changes  Recent Labs: 09/10/2020:  TSH 4.060 05/10/2021: B Natriuretic Peptide 471.5 05/13/2021: Magnesium 2.0 06/20/2021: ALT 3; Hemoglobin 11.5; Platelets 235 07/09/2021: BUN 17; Creatinine, Ser 1.50; NT-Pro BNP  3,548; Potassium 4.5; Sodium 144   Recent Lipid Panel Lab Results  Component Value Date/Time   CHOL 81 05/10/2021 11:05 PM   CHOL 89 (L) 03/08/2020 11:58 AM   CHOL 98 05/23/2014 08:57 AM   TRIG 22 05/10/2021 11:05 PM   TRIG 83 05/23/2014 08:57 AM   HDL 47 05/10/2021 11:05 PM   HDL 37 (L) 03/08/2020 11:58 AM   HDL 35 (L) 05/23/2014 08:57 AM   LDLCALC 30 05/10/2021 11:05 PM   LDLCALC 36 03/08/2020 11:58 AM   LDLCALC 46 05/23/2014 08:57 AM      Risk Assessment/Calculations:      Physical Exam:    VS:  BP 140/64   Pulse (!) 105   Ht 5' 7.5" (1.715 m)   Wt 172 lb 3.2 oz (78.1 kg)   SpO2 93%   BMI 26.57 kg/m     Wt Readings from Last 3 Encounters:  07/23/21 171 lb 6.4 oz (77.7 kg)  07/23/21 172 lb 3.2 oz (78.1 kg)  06/20/21 170 lb 12.8 oz (77.5 kg)     Constitutional:      Appearance: Healthy appearance. Not in distress.  Neck:     Vascular: JVD normal.  Pulmonary:     Breath sounds: No wheezing. No rales.  Cardiovascular:     Normal rate. Irregular rhythm. Normal S1. Normal S2.      Murmurs: There is a grade 2/6 systolic murmur at the URSB.  Edema:    Pretibial: bilateral trace edema of the pretibial area.    Comments: Compression stockings in place Abdominal:     Palpations: Abdomen is soft.  Skin:    General: Skin is warm and dry.  Neurological:     General: No focal deficit present.     Mental Status: Alert and oriented to person, place and time.         ASSESSMENT & PLAN:    1. Chronic heart failure with preserved ejection fraction (HCC) EF 60-65 by echocardiogram in 10/21.  Overall, her volume status appears stable.  She remains on furosemide 80 mg daily in addition to spironolactone 25 mg daily.  Her GFR is 50 mL/min.  I think she would be a good candidate for an SGLT2  inhibitor.  We discussed mechanism of action and the benefits of this therapy.  She would like to initiate.  Therefore, I will start her on dapagliflozin 10 mg daily.  Obtain a BMET in 2 weeks.  We discussed potential side effects including significant GU infections.  She knows to seek medical attention if she develops symptoms.  Continue current dose of furosemide, spironolactone.  Follow-up with Dr. Harrington Challenger or me in 6 to 8 weeks.  Of note, she had considered switching to aspirin but prefers to have follow-up here in [place].  2. Coronary artery disease of native artery of native heart with stable angina pectoris ([place]) History of CABG in 2004 and DES to the left main in 2010.  She had a low risk Myoview in 2018.  She is not having anginal symptoms.  Continue aspirin, statin.  3. History of DVT (deep vein thrombosis) She is no longer on anticoagulation therapy secondary to history of GI bleeding.  She is status post IVC filter.  4. Essential hypertension Blood pressure somewhat elevated.  She would likely have some lowering of her blood pressure with initiation of dapagliflozin.  5. Mixed hyperlipidemia Continue atorvastatin at current dose.  LDL in 5/22 was optimal.  6. Stage 3a chronic kidney disease (  [place]) Renal function has remained stable.  As noted, BMET will be obtained 2 weeks after starting dapagliflozin.  7. Chronic obstructive pulmonary disease, unspecified COPD type ([place]) Overall appears stable.  Continue follow-up with primary care.  8. Aortic atherosclerosis (HCC) Continue aspirin, atorvastatin.         Dispo:  Return in about 8 weeks (around 09/17/2021) for Routine Follow Up, w/ Dr. Harrington Challenger, or [name], PA-C.   Medication Adjustments/Labs and Tests Ordered: Current medicines are reviewed at length with the patient today.  Concerns regarding medicines are outlined above.  Tests Ordered: Orders Placed This Encounter  Procedures   Basic Metabolic Panel (BMET)   EKG  12-Lead   Medication Changes: Meds ordered this encounter  Medications   dapagliflozin propanediol (FARXIGA) 10 MG TABS tablet    Sig: Take 1 tablet (10 mg total) by mouth daily before breakfast.    Dispense:  30 tablet    Refill:  [address], [name], PA-C  07/23/2021 5:53 PM    [facility] [place], [place], [place]  [address] Phone: [phone]; Fax: [phone]

## 2021-07-23 NOTE — Patient Instructions (Signed)
Medication Instructions:   START Wilder Glade one tablet by mouth ( 10 mg ) daily.   *If you need a refill on your cardiac medications before your next appointment, please call your pharmacy*   Lab Work:  2 weeks in [place] no fasting required.   If you have labs (blood work) drawn today and your tests are completely normal, you will receive your results only by: [place] (if you have MyChart) OR A paper copy in the mail If you have any lab test that is abnormal or we need to change your treatment, we will call you to review the results.   Testing/Procedures:  -NONE  Follow-Up: At [facility], you and your health needs are our priority.  As part of our continuing mission to provide you with exceptional heart care, we have created designated Provider Care Teams.  These Care Teams include your primary Cardiologist (physician) and Advanced Practice Providers (APPs -  Physician Assistants and Nurse Practitioners) who all work together to provide you with the care you need, when you need it.  We recommend signing up for the patient portal called "MyChart".  Sign up information is provided on this After Visit Summary.  MyChart is used to connect with patients for Virtual Visits (Telemedicine).  Patients are able to view lab/test results, encounter notes, upcoming appointments, etc.  Non-urgent messages can be sent to your provider as well.   To learn more about what you can do with MyChart, go to [email or web address].    Your next appointment:   8 week(s) with Dr. Harrington Challenger on Thursday, September 22 @ 3:00 pm.    The format for your next appointment:   In Person  Provider:   [name], PA-C   Other Instructions

## 2021-07-23 NOTE — Assessment & Plan Note (Signed)
Does not appear to be in overt volume overload.  Continue with cardiology recommendations and follow-up.  Hold on repeat echo at this time.  Patient is to keep follow-up with cardiology as planned

## 2021-07-23 NOTE — Assessment & Plan Note (Signed)
Chronic hypoxic respiratory failure.  Patient's O2 was increased last admission.  She appears to be improved with decreased oxygen demands.  Today in the office O2 saturations were normal on room air.  For now we will continue oxygen 2 L with activity as needed to maintain O2 saturations greater than 88 to 90%.  And continue on 2 L at bedtime.

## 2021-07-23 NOTE — Assessment & Plan Note (Signed)
Reported obstructive sleep apnea-CPAP noncompliant.  Continue on nocturnal oxygen.  Check ABG for possible hypercarbic component and consider nocturnal trilogy device.

## 2021-07-23 NOTE — Patient Instructions (Addendum)
Continue on TRELEGY 1 puff daily , rinse after use  Decrease Oxgyen 2l/m with activity and At bedtime   Activity as tolerated.  ABG as recommended.  Use extreme caution using sedating medications.  Combivent inhaler As needed  wheezing /shortness of breath .  Follow up with Dr. Chase Caller in 2 months and As needed   Please contact office for sooner follow up if symptoms do not improve or worsen or seek emergency care

## 2021-07-25 DIAGNOSIS — J449 Chronic obstructive pulmonary disease, unspecified: Secondary | ICD-10-CM | POA: Diagnosis not present

## 2021-08-08 DIAGNOSIS — J449 Chronic obstructive pulmonary disease, unspecified: Secondary | ICD-10-CM | POA: Diagnosis not present

## 2021-08-12 ENCOUNTER — Other Ambulatory Visit: Payer: Self-pay

## 2021-08-12 NOTE — Patient Outreach (Signed)
[facility]) Care Management  [place]  08/12/2021   Taide Tafur 06/24/1941 [id number]  Subjective: Telephone call to patient for follow up.  Patient reports she is doing ok. She denies any problems.  Reiterated heart failure.  She voices no concerns.    Objective:   Encounter Medications:  Outpatient Encounter Medications as of 08/12/2021  Medication Sig   albuterol (PROVENTIL) (2.5 MG/3ML) 0.083% nebulizer solution Take 2.5 mg by nebulization every 6 (six) hours as needed for wheezing or shortness of breath.   allopurinol (ZYLOPRIM) 100 MG tablet Take 100 mg by mouth daily with breakfast.   ARIPiprazole (ABILIFY) 2 MG tablet Take 2 mg by mouth daily with breakfast.   aspirin EC 81 MG tablet Take 81 mg by mouth daily with breakfast. Swallow whole.   atorvastatin (LIPITOR) 20 MG tablet Take 20 mg by mouth daily with supper.   cholecalciferol (VITAMIN D3) 25 MCG (1000 UNIT) tablet Take 1,000 Units by mouth daily with supper.   cyclobenzaprine (FLEXERIL) 10 MG tablet Take 10 mg by mouth daily.   dapagliflozin propanediol (FARXIGA) 10 MG TABS tablet Take 1 tablet (10 mg total) by mouth daily before breakfast.   ferrous sulfate 325 (65 FE) MG tablet Take 325 mg by mouth daily with breakfast.   Fluticasone-Umeclidin-Vilant (TRELEGY ELLIPTA) 200-62.5-25 MCG/INH AEPB Inhale 1 puff into the lungs daily.   furosemide (LASIX) 80 MG tablet Take 1 tablet (80 mg total) by mouth daily.   furosemide (LASIX) 80 MG tablet Take daily as needed in the afternoon for a weight gain of 2 pounds overnight or 5 pounds in 1 week   gabapentin (NEURONTIN) 100 MG capsule Take 100 mg by mouth daily with breakfast.   Ipratropium-Albuterol (COMBIVENT RESPIMAT) 20-100 MCG/ACT AERS respimat Inhale 1 puff into the lungs 3 (three) times daily as needed for wheezing or shortness of breath.   levothyroxine (SYNTHROID) 50 MCG tablet Take 1 tablet (50 mcg total) by mouth daily at 6 (six) AM.    magnesium gluconate (MAGONATE) 500 MG tablet Take 500 mg by mouth daily.   Melatonin 10 MG TABS Take 30 mg by mouth at bedtime.   nitroGLYCERIN (NITROSTAT) 0.4 MG SL tablet Place 1 tablet (0.4 mg total) under the tongue every 5 (five) minutes as needed for chest pain. For chest pain   omeprazole (PRILOSEC) 40 MG capsule TAKE 1 CAPSULE BY MOUTH 2 TIMES DAILY.   OXYGEN Inhale 4 L/min into the lungs as needed (DURING ALL TIMES OF EXERTION).    pilocarpine (PILOCAR) 1 % ophthalmic solution Place 1 drop into both eyes 2 (two) times daily.   potassium chloride SA (KLOR-CON) 20 MEQ tablet Take 20 mEq by mouth daily with lunch.   pramipexole (MIRAPEX) 0.25 MG tablet Take 0.25 mg by mouth daily with supper.   roflumilast (DALIRESP) 500 MCG TABS tablet Take 500 mcg by mouth daily with breakfast.   spironolactone (ALDACTONE) 25 MG tablet Take 1 tablet (25 mg total) by mouth daily.   traZODone (DESYREL) 50 MG tablet Take 150 mg by mouth at bedtime.    vitamin C (ASCORBIC ACID) 500 MG tablet Take 500 mg by mouth daily with lunch.   XTAMPZA ER 9 MG C12A Take 9 mg by mouth 2 (two) times daily.   No facility-administered encounter medications on file as of 08/12/2021.    Functional Status:  In your present state of health, do you have any difficulty performing the following activities: 05/11/2021 04/29/2021  Hearing? N N  Vision? N N  Difficulty concentrating or making decisions? Y Y  Comment - -  Walking or climbing stairs? Y Y  Comment - -  Dressing or bathing? N N  Comment - -  Doing errands, shopping? [place] and eating ? - -  Comment - -  Using the Toilet? - -  In the past six months, have you accidently leaked urine? - -  Do you have problems with loss of bowel control? - -  Managing your Medications? - -  Comment - -  Managing your Finances? - -  Comment - -  Housekeeping or managing your Housekeeping? - -  Comment - -  Some recent data might be hidden     Fall/Depression Screening: Fall Risk  08/12/2021 02/20/2021 01/10/2021  Falls in the past year? 0 0 0  Comment - - -  Number falls in past yr: - - -  Injury with Fall? - - -  Risk for fall due to : - - -  Follow up - - -   PHQ 2/9 Scores 08/12/2021 01/10/2021 11/07/2020 08/15/2020 03/30/2020 10/11/2019 10/12/2018  PHQ - 2 Score 0 0 0 1 0 2 0  PHQ- 9 Score - - - - - 17 -    Assessment:   Care Plan Care Plan : General Plan of Care (Adult)  Updates made by Jon Billings, RN since 08/12/2021 12:00 AM     Problem: Health Promotion or Disease Self-Management  . Resolved 08/12/2021  Priority: High  Onset Date: 05/06/2021  Note:   Determine level of modifiable health risk.  Assess level of patient activation, level of readiness, importance and confidence  to make changes.  Evoke change talk using open-ended questions, pros and cons, as well as  looking forward.    Care Plan : Heart Failure (Adult)  Updates made by Jon Billings, RN since 08/12/2021 12:00 AM     Problem: Symptom Exacerbation (Heart Failure)      Long-Range Goal: Symptom Exacerbation Prevented or Minimized as evidenced by no exacerbation of heart failure   Start Date: 11/07/2020  Expected End Date: 12/21/2021  This Visit's Progress: On track  Recent Progress: On track  Priority: High  Note:   Evidence-based guidance:  Establish a mutually-agreed-upon early intervention process to communicate  with primary care provider when signs/symptoms worsen.  Notes:     Task: Identify and Minimize Risk of Heart Failure Exacerbation   Due Date: 12/21/2021  Priority: Routine  Responsible User: Jon Billings, RN  Note:   Care Management Activities:    - healthy lifestyle promoted - rescue (action) plan reviewed - self-awareness of signs/symptoms of worsening disease encouraged    Notes: 02/20/21 patient able to reports when to notify physician. 03/22/21 Patient weight today 170 lbs. Patient states she has notified the  doctor of her weights and is to call today with update.  Patient with increase of lasix recently.  Discussed diet of low salt.  05/06/21 Patient with recent heart failure exacerbation.  Weight today 168 lbs. Patient has follow ups to schedule with heart doctor and PCP.  Family assists with appointments.   06/19/21 Patient weight 170 lbs.  Family active with care. Patient stays with son and family at night. Daughter in law does the cooking. Patient reports shortness of breath not as bad but feels she is in a good place.  Discussed palliative care with patient and what that means.   Heart Failure Management Re-enforced  Please weight daily or as ordered by your doctor Report to your doctor weight gain of 2 -3 pounds in a day or 5 pounds in a week Limit salt intake Monitor for shortness of breath, swelling of feet, ankles or abdomen and weight gain. Never use the salt shaker.  Read all food labels and avoid canned, processed and pickled foods.   Follow your doctor's recommendations for daily salt intake.  07/10/21 Patient weight 171 lbs.  Continued education about heart failure management. Patient knows to call the cardiologist for changes such as weight gain, increased shortness of breath, or swelling.  08/12/21 Patient continues to weigh daily. Weight 160 lbs. Patient reports no swelling.  Reiterated heart failure management and encouraged to continue the great work.       Goals Addressed               This Visit's Progress     THN-Track and Manage Fluids and Swelling (pt-stated)   On track     Barriers: Knowledge Timeframe:  Long-Range Goal Priority:  High Start Date:   12/20/20                          Expected End Date:12/21/21 Follow up 09/20/21  - use salt in moderation - watch for swelling in feet, ankles and legs every day - weigh myself daily    Why is this important?   It is important to check your weight daily and watch how much salt and liquids you have.  It will help you  to manage your heart failure.    Notes: Patient weighs daily. 12/20/20 173 lbs 01/10/21- 171 lbs. 02/20/21 patient weight 165 lbs  03/22/21 patient weight today 170 lbs but patient reports weight up to 176 lbs earlier this week and MD increased her lasix. Patient states she has to call to report her weight today.  Reiterated the importance of a low salt diet.   05/06/21 Patient weight 168 lbs.  Reiterated heart failure management.   06/19/21 Patient weight 170 lbs.   Heart Failure Management Re-inforced Please weight daily or as ordered by your doctor Report to your doctor weight gain of 2 -3 pounds in a day or 5 pounds in a week Limit salt intake Monitor for shortness of breath, swelling of feet, ankles or abdomen and weight gain. Never use the salt shaker.  Read all food labels and avoid canned, processed and pickled foods.   Follow your doctor's recommendations for daily salt intake.  07/10/21 Patient weight 171 lbs.  Continued education about heart failure management. Patient knows to call the cardiologist for changes such as weight gain, increased shortness of breath, or swelling.  08/12/21 Patient continues to weigh daily. Weight 160 lbs. Patient reports no swelling.  Reiterated heart failure management and encouraged to continue the great work.        Plan:  Follow-up: Patient agrees to Care Plan and Follow-up. Follow-up in 4-6 week(s)  [name], RN, MSN [place] [facility] Care Management Coordinator Direct Line [phone] Cell [phone] Toll Free: [phone]  Fax: [phone]

## 2021-08-12 NOTE — Patient Instructions (Signed)
Goals Addressed               This Visit's Progress     THN-Track and Manage Fluids and Swelling (pt-stated)   On track     Barriers: Knowledge Timeframe:  Long-Range Goal Priority:  High Start Date:   12/20/20                          Expected End Date:12/21/21 Follow up 09/20/21  - use salt in moderation - watch for swelling in feet, ankles and legs every day - weigh myself daily    Why is this important?   It is important to check your weight daily and watch how much salt and liquids you have.  It will help you to manage your heart failure.    Notes: Patient weighs daily. 12/20/20 173 lbs 01/10/21- 171 lbs. 02/20/21 patient weight 165 lbs  03/22/21 patient weight today 170 lbs but patient reports weight up to 176 lbs earlier this week and MD increased her lasix. Patient states she has to call to report her weight today.  Reiterated the importance of a low salt diet.   05/06/21 Patient weight 168 lbs.  Reiterated heart failure management.   06/19/21 Patient weight 170 lbs.   Heart Failure Management Re-inforced Please weight daily or as ordered by your doctor Report to your doctor weight gain of 2 -3 pounds in a day or 5 pounds in a week Limit salt intake Monitor for shortness of breath, swelling of feet, ankles or abdomen and weight gain. Never use the salt shaker.  Read all food labels and avoid canned, processed and pickled foods.   Follow your doctor's recommendations for daily salt intake.  07/10/21 Patient weight 171 lbs.  Continued education about heart failure management. Patient knows to call the cardiologist for changes such as weight gain, increased shortness of breath, or swelling.  08/12/21 Patient continues to weigh daily. Weight 160 lbs. Patient reports no swelling.  Reiterated heart failure management and encouraged to continue the great work.

## 2021-08-21 DIAGNOSIS — N183 Chronic kidney disease, stage 3 unspecified: Secondary | ICD-10-CM | POA: Diagnosis not present

## 2021-08-21 DIAGNOSIS — E785 Hyperlipidemia, unspecified: Secondary | ICD-10-CM | POA: Diagnosis not present

## 2021-08-25 DIAGNOSIS — J449 Chronic obstructive pulmonary disease, unspecified: Secondary | ICD-10-CM | POA: Diagnosis not present

## 2021-09-03 ENCOUNTER — Other Ambulatory Visit (HOSPITAL_COMMUNITY): Payer: Self-pay

## 2021-09-03 ENCOUNTER — Other Ambulatory Visit: Payer: Self-pay

## 2021-09-03 ENCOUNTER — Ambulatory Visit (INDEPENDENT_AMBULATORY_CARE_PROVIDER_SITE_OTHER): Payer: Medicare HMO

## 2021-09-03 DIAGNOSIS — U071 COVID-19: Secondary | ICD-10-CM | POA: Diagnosis not present

## 2021-09-03 MED ORDER — DIPHENHYDRAMINE HCL 50 MG/ML IJ SOLN: 50.0000 mg | Freq: Once | INTRAMUSCULAR | Status: AC | PRN

## 2021-09-03 MED ORDER — BEBTELOVIMAB 175 MG/2 ML IV (EUA)
175.0000 mg | Freq: Once | INTRAMUSCULAR | Status: AC
  Administered 2021-09-03: 175 mg via INTRAVENOUS

## 2021-09-03 MED ORDER — METHYLPREDNISOLONE SODIUM SUCC 125 MG IJ SOLR: 125.0000 mg | Freq: Once | INTRAMUSCULAR | Status: AC | PRN

## 2021-09-03 MED ORDER — SODIUM CHLORIDE 0.9 % IV SOLN: INTRAVENOUS | Status: DC | PRN

## 2021-09-03 MED ORDER — FAMOTIDINE IN NACL 20-0.9 MG/50ML-% IV SOLN: 20.0000 mg | Freq: Once | INTRAVENOUS | Status: AC | PRN

## 2021-09-03 NOTE — Progress Notes (Signed)
Diagnosis: COVID  Provider:  Marshell Garfinkel, MD  Procedure: Infusion  IV Type: Peripheral, IV Location: L Hand  Bebtelovimab, Dose: 175 mg  Infusion Start Time: [id number] 09/03/2021  Infusion Stop Time: [id number] 09/03/2021  Post Infusion IV Care: Observation period completed and Peripheral IV Discontinued  Discharge: Condition: Good, Destination: Home . AVS provided to patient.   Performed by:  Arnoldo Morale, RN

## 2021-09-03 NOTE — Patient Instructions (Signed)
10 Things You Can Do to Manage Your COVID-19 Symptoms at Home If you have possible or confirmed COVID-19 Stay home except to get medical care. Monitor your symptoms carefully. If your symptoms get worse, call your healthcare provider immediately. Get rest and stay hydrated. If you have a medical appointment, call the healthcare provider ahead of time and tell them that you have or may have COVID-19. For medical emergencies, call 911 and notify the dispatch personnel that you have or may have COVID-19. Cover your cough and sneezes with a tissue or use the inside of your elbow. Wash your hands often with soap and water for at least 20 seconds or clean your hands with an alcohol-based hand sanitizer that contains at least 60% alcohol. As much as possible, stay in a specific room and away from other people in your home. Also, you should use a separate bathroom, if available. If you need to be around other people in or outside of the home, wear a mask. Avoid sharing personal items with other people in your household, like dishes, towels, and bedding. Clean all surfaces that are touched often, like counters, tabletops, and doorknobs. Use household cleaning sprays or wipes according to the label instructions. [email or web address] 07/06/2020 This information is not intended to replace advice given to you by your health care provider. Make sure you discuss any questions you have with your health care provider. Document Revised: 04/25/2021 Document Reviewed: 04/25/2021 Elsevier Patient Education  2022 [employer]. Patient was given drug information sheet for [employer].  Also given cost estimate sheet for Bebtelovimab.  Patient reviewed documentation and questions were answered.  Patient would like to proceed with treatment at this time.

## 2021-09-04 ENCOUNTER — Other Ambulatory Visit: Payer: Self-pay | Admitting: Physician Assistant

## 2021-09-04 ENCOUNTER — Other Ambulatory Visit: Payer: Self-pay | Admitting: Internal Medicine

## 2021-09-08 DIAGNOSIS — J449 Chronic obstructive pulmonary disease, unspecified: Secondary | ICD-10-CM | POA: Diagnosis not present

## 2021-09-11 ENCOUNTER — Other Ambulatory Visit: Payer: Self-pay

## 2021-09-11 NOTE — Patient Instructions (Signed)
Goals Addressed               This Visit's Progress     THN-Track and Manage Fluids and Swelling (pt-stated)   On track     Barriers: Knowledge Timeframe:  Long-Range Goal Priority:  High Start Date:   12/20/20                          Expected End Date:06/20/22 Follow up 10/21/21  - use salt in moderation - watch for swelling in feet, ankles and legs every day - weigh myself daily    Why is this important?   It is important to check your weight daily and watch how much salt and liquids you have.  It will help you to manage your heart failure.    Notes: Patient weighs daily. 12/20/20 173 lbs 01/10/21- 171 lbs. 02/20/21 patient weight 165 lbs  03/22/21 patient weight today 170 lbs but patient reports weight up to 176 lbs earlier this week and MD increased her lasix. Patient states she has to call to report her weight today.  Reiterated the importance of a low salt diet.   05/06/21 Patient weight 168 lbs.  Reiterated heart failure management.   06/19/21 Patient weight 170 lbs.   Heart Failure Management Re-inforced Please weight daily or as ordered by your doctor Report to your doctor weight gain of 2 -3 pounds in a day or 5 pounds in a week Limit salt intake Monitor for shortness of breath, swelling of feet, ankles or abdomen and weight gain. Never use the salt shaker.  Read all food labels and avoid canned, processed and pickled foods.   Follow your doctor's recommendations for daily salt intake.  07/10/21 Patient weight 171 lbs.  Continued education about heart failure management. Patient knows to call the cardiologist for changes such as weight gain, increased shortness of breath, or swelling.  08/12/21 Patient continues to weigh daily. Weight 160 lbs. Patient reports no swelling.  Reiterated heart failure management and encouraged to continue the great work. 09/11/21 Patient weight down to 151 lbs.  Appetite is not as good as she recently had COVID and is getting over it.  Discussed  importance of maintaining nutrition.  Reiterated heart failure management and importance of physician follow up.  No concerns.

## 2021-09-11 NOTE — Patient Outreach (Signed)
[facility]) Care Management  [place]  09/11/2021   [name] 31-May-1941 [id number]  Subjective: Telephone call to patient for follow up.  Patient doing ok.  Getting over [place].  She reports only having upset stomach.  Did receive Bebtelovimab.  She doing better.  Discussed COVID precautions.  No concerns.  Heart failure management continues.    Objective:   Encounter Medications:  Outpatient Encounter Medications as of 09/11/2021  Medication Sig   albuterol (PROVENTIL) (2.5 MG/3ML) 0.083% nebulizer solution Take 2.5 mg by nebulization every 6 (six) hours as needed for wheezing or shortness of breath.   allopurinol (ZYLOPRIM) 100 MG tablet Take 100 mg by mouth daily with breakfast.   ARIPiprazole (ABILIFY) 2 MG tablet Take 2 mg by mouth daily with breakfast.   aspirin EC 81 MG tablet Take 81 mg by mouth daily with breakfast. Swallow whole.   atorvastatin (LIPITOR) 20 MG tablet Take 20 mg by mouth daily with supper.   cholecalciferol (VITAMIN D3) 25 MCG (1000 UNIT) tablet Take 1,000 Units by mouth daily with supper.   cyclobenzaprine (FLEXERIL) 10 MG tablet Take 10 mg by mouth daily.   dapagliflozin propanediol (FARXIGA) 10 MG TABS tablet Take 1 tablet (10 mg total) by mouth daily before breakfast.   ferrous sulfate 325 (65 FE) MG tablet Take 325 mg by mouth daily with breakfast.   Fluticasone-Umeclidin-Vilant (TRELEGY ELLIPTA) 200-62.5-25 MCG/INH AEPB Inhale 1 puff into the lungs daily.   furosemide (LASIX) 80 MG tablet Take 1 tablet (80 mg total) by mouth daily.   furosemide (LASIX) 80 MG tablet Take daily as needed in the afternoon for a weight gain of 2 pounds overnight or 5 pounds in 1 week   gabapentin (NEURONTIN) 100 MG capsule Take 100 mg by mouth daily with breakfast.   Ipratropium-Albuterol (COMBIVENT RESPIMAT) 20-100 MCG/ACT AERS respimat Inhale 1 puff into the lungs 3 (three) times daily as needed  for wheezing or shortness of breath.   levothyroxine (SYNTHROID) 50 MCG tablet TAKE 1 TABLET BY MOUTH DAILY AT 6AM.   magnesium gluconate (MAGONATE) 500 MG tablet Take 500 mg by mouth daily.   Melatonin 10 MG TABS Take 30 mg by mouth at bedtime.   nitroGLYCERIN (NITROSTAT) 0.4 MG SL tablet Place 1 tablet (0.4 mg total) under the tongue every 5 (five) minutes as needed for chest pain. For chest pain   omeprazole (PRILOSEC) 40 MG capsule TAKE 1 CAPSULE BY MOUTH 2 TIMES DAILY.   OXYGEN Inhale 4 L/min into the lungs as needed (DURING ALL TIMES OF EXERTION).    pilocarpine (PILOCAR) 1 % ophthalmic solution Place 1 drop into both eyes 2 (two) times daily.   potassium chloride SA (KLOR-CON) 20 MEQ tablet Take 20 mEq by mouth daily with lunch.   pramipexole (MIRAPEX) 0.25 MG tablet Take 0.25 mg by mouth daily with supper.   roflumilast (DALIRESP) 500 MCG TABS tablet Take 500 mcg by mouth daily with breakfast.   spironolactone (ALDACTONE) 25 MG tablet Take 1 tablet (25 mg total) by mouth daily.   traZODone (DESYREL) 50 MG tablet Take 150 mg by mouth at bedtime.    vitamin C (ASCORBIC ACID) 500 MG tablet Take 500 mg by mouth daily with lunch.   XTAMPZA ER 9 MG C12A Take 9 mg by mouth 2 (two) times daily.   Facility-Administered Encounter Medications as of 09/11/2021  Medication   0.9 %  sodium chloride infusion  Functional Status:  In your present state of health, do you have any difficulty performing the following activities: 05/11/2021 04/29/2021  Hearing? N N  Vision? N N  Difficulty concentrating or making decisions? Y Y  Comment - -  Walking or climbing stairs? Y Y  Comment - -  Dressing or bathing? N N  Comment - -  Doing errands, shopping? [place] and eating ? - -  Comment - -  Using the Toilet? - -  In the past six months, have you accidently leaked urine? - -  Do you have problems with loss of bowel control? - -  Managing your Medications? - -  Comment - -   Managing your Finances? - -  Comment - -  Housekeeping or managing your Housekeeping? - -  Comment - -  Some recent data might be hidden    Fall/Depression Screening: Fall Risk  08/12/2021 02/20/2021 01/10/2021  Falls in the past year? 0 0 0  Comment - - -  Number falls in past yr: - - -  Injury with Fall? - - -  Risk for fall due to : - - -  Follow up - - -   PHQ 2/9 Scores 08/12/2021 01/10/2021 11/07/2020 08/15/2020 03/30/2020 10/11/2019 10/12/2018  PHQ - 2 Score 0 0 0 1 0 2 0  PHQ- 9 Score - - - - - 17 -    Assessment:   Care Plan Care Plan : Heart Failure (Adult)  Updates made by Jon Billings, RN since 09/11/2021 12:00 AM     Problem: Symptom Exacerbation (Heart Failure)      Long-Range Goal: Symptom Exacerbation Prevented or Minimized as evidenced by no exacerbation of heart failure   Start Date: 11/07/2020  Expected End Date: 12/21/2021  This Visit's Progress: On track  Recent Progress: On track  Priority: High  Note:   Evidence-based guidance:  Establish a mutually-agreed-upon early intervention process to communicate  with primary care provider when signs/symptoms worsen.  Notes:     Task: Identify and Minimize Risk of Heart Failure Exacerbation   Due Date: 06/20/2022  Priority: Routine  Responsible User: Jon Billings, RN  Note:   Care Management Activities:    - healthy lifestyle promoted - rescue (action) plan reviewed - self-awareness of signs/symptoms of worsening disease encouraged    Notes: 02/20/21 patient able to reports when to notify physician. 03/22/21 Patient weight today 170 lbs. Patient states she has notified the doctor of her weights and is to call today with update.  Patient with increase of lasix recently.  Discussed diet of low salt.  05/06/21 Patient with recent heart failure exacerbation.  Weight today 168 lbs. Patient has follow ups to schedule with heart doctor and PCP.  Family assists with appointments.   06/19/21 Patient weight 170 lbs.   Family active with care. Patient stays with son and family at night. Daughter in law does the cooking. Patient reports shortness of breath not as bad but feels she is in a good place.  Discussed palliative care with patient and what that means.   Heart Failure Management Re-enforced Please weight daily or as ordered by your doctor Report to your doctor weight gain of 2 -3 pounds in a day or 5 pounds in a week Limit salt intake Monitor for shortness of breath, swelling of feet, ankles or abdomen and weight gain. Never use the salt shaker.  Read all food labels and avoid canned, processed and  pickled foods.   Follow your doctor's recommendations for daily salt intake.  07/10/21 Patient weight 171 lbs.  Continued education about heart failure management. Patient knows to call the cardiologist for changes such as weight gain, increased shortness of breath, or swelling.  08/12/21 Patient continues to weigh daily. Weight 160 lbs. Patient reports no swelling.  Reiterated heart failure management and encouraged to continue the great work.  09/11/21 Patient reports she is doing ok.  Getting over [place]. Reports only having problems with stomach upset. Weight 151 lbs. Patient reports that her appetite has not been the best.  Discussed importance of maintaining nutrition.  Reiterated heart failure management and physician follow up.          Goals Addressed               This Visit's Progress     THN-Track and Manage Fluids and Swelling (pt-stated)   On track     Barriers: Knowledge Timeframe:  Long-Range Goal Priority:  High Start Date:   12/20/20                          Expected End Date:06/20/22 Follow up 10/21/21  - use salt in moderation - watch for swelling in feet, ankles and legs every day - weigh myself daily    Why is this important?   It is important to check your weight daily and watch how much salt and liquids you have.  It will help you to manage your heart failure.    Notes:  Patient weighs daily. 12/20/20 173 lbs 01/10/21- 171 lbs. 02/20/21 patient weight 165 lbs  03/22/21 patient weight today 170 lbs but patient reports weight up to 176 lbs earlier this week and MD increased her lasix. Patient states she has to call to report her weight today.  Reiterated the importance of a low salt diet.   05/06/21 Patient weight 168 lbs.  Reiterated heart failure management.   06/19/21 Patient weight 170 lbs.   Heart Failure Management Re-inforced Please weight daily or as ordered by your doctor Report to your doctor weight gain of 2 -3 pounds in a day or 5 pounds in a week Limit salt intake Monitor for shortness of breath, swelling of feet, ankles or abdomen and weight gain. Never use the salt shaker.  Read all food labels and avoid canned, processed and pickled foods.   Follow your doctor's recommendations for daily salt intake.  07/10/21 Patient weight 171 lbs.  Continued education about heart failure management. Patient knows to call the cardiologist for changes such as weight gain, increased shortness of breath, or swelling.  08/12/21 Patient continues to weigh daily. Weight 160 lbs. Patient reports no swelling.  Reiterated heart failure management and encouraged to continue the great work. 09/11/21 Patient weight down to 151 lbs.  Appetite is not as good as she recently had COVID and is getting over it.  Discussed importance of maintaining nutrition.  Reiterated heart failure management and importance of physician follow up.  No concerns.         Plan:  Follow-up: Patient agrees to Care Plan and Follow-up. Follow-up in 4-6 week(s)  [name], RN, MSN [place] Management Care Management Coordinator Direct Line [phone] Cell [phone] Toll Free: [phone]  Fax: [phone]

## 2021-09-12 ENCOUNTER — Ambulatory Visit: Payer: Medicare HMO | Admitting: Internal Medicine

## 2021-09-13 DIAGNOSIS — F331 Major depressive disorder, recurrent, moderate: Secondary | ICD-10-CM | POA: Diagnosis not present

## 2021-09-13 DIAGNOSIS — G894 Chronic pain syndrome: Secondary | ICD-10-CM | POA: Diagnosis not present

## 2021-09-13 DIAGNOSIS — G4700 Insomnia, unspecified: Secondary | ICD-10-CM | POA: Diagnosis not present

## 2021-09-24 DIAGNOSIS — J449 Chronic obstructive pulmonary disease, unspecified: Secondary | ICD-10-CM | POA: Diagnosis not present

## 2021-10-02 DIAGNOSIS — G894 Chronic pain syndrome: Secondary | ICD-10-CM | POA: Diagnosis not present

## 2021-10-02 DIAGNOSIS — R5383 Other fatigue: Secondary | ICD-10-CM | POA: Diagnosis not present

## 2021-10-08 DIAGNOSIS — J449 Chronic obstructive pulmonary disease, unspecified: Secondary | ICD-10-CM | POA: Diagnosis not present

## 2021-10-10 ENCOUNTER — Other Ambulatory Visit: Payer: Self-pay

## 2021-10-10 NOTE — Patient Outreach (Signed)
[facility]) Care Management  10/10/2021  [name] [birth year] [id number]   Telephone call to patient for follow up.  No answer.  Unable to leave a message.    Plan: RN CM will attempt patient again in 2-3 weeks.    [name], RN, MSN [place] Management Care Management Coordinator Direct Line [phone] Cell [phone] Toll Free: [phone]  Fax: [phone]

## 2021-10-18 ENCOUNTER — Other Ambulatory Visit: Payer: Self-pay

## 2021-10-18 NOTE — Patient Outreach (Signed)
[name]) Care Management  [place]  10/18/2021   [name] Aug 25, 1941 [id number]  Subjective: Telephone call to patient for heart failure follow up.  Patient reports she is doing pretty good.  Reiterated heart failure management. No concerns.    Objective:   Encounter Medications:  Outpatient Encounter Medications as of 10/18/2021  Medication Sig   albuterol (PROVENTIL) (2.5 MG/3ML) 0.083% nebulizer solution Take 2.5 mg by nebulization every 6 (six) hours as needed for wheezing or shortness of breath.   allopurinol (ZYLOPRIM) 100 MG tablet Take 100 mg by mouth daily with breakfast.   ARIPiprazole (ABILIFY) 2 MG tablet Take 2 mg by mouth daily with breakfast.   aspirin EC 81 MG tablet Take 81 mg by mouth daily with breakfast. Swallow whole.   atorvastatin (LIPITOR) 20 MG tablet Take 20 mg by mouth daily with supper.   cholecalciferol (VITAMIN D3) 25 MCG (1000 UNIT) tablet Take 1,000 Units by mouth daily with supper.   cyclobenzaprine (FLEXERIL) 10 MG tablet Take 10 mg by mouth daily.   dapagliflozin propanediol (FARXIGA) 10 MG TABS tablet Take 1 tablet (10 mg total) by mouth daily before breakfast.   ferrous sulfate 325 (65 FE) MG tablet Take 325 mg by mouth daily with breakfast.   Fluticasone-Umeclidin-Vilant (TRELEGY ELLIPTA) 200-62.5-25 MCG/INH AEPB Inhale 1 puff into the lungs daily.   furosemide (LASIX) 80 MG tablet Take 1 tablet (80 mg total) by mouth daily.   furosemide (LASIX) 80 MG tablet Take daily as needed in the afternoon for a weight gain of 2 pounds overnight or 5 pounds in 1 week   gabapentin (NEURONTIN) 100 MG capsule Take 100 mg by mouth daily with breakfast.   Ipratropium-Albuterol (COMBIVENT RESPIMAT) 20-100 MCG/ACT AERS respimat Inhale 1 puff into the lungs 3 (three) times daily as needed for wheezing or shortness of breath.   levothyroxine (SYNTHROID) 50 MCG tablet TAKE 1 TABLET BY MOUTH DAILY AT 6AM.   magnesium gluconate  (MAGONATE) 500 MG tablet Take 500 mg by mouth daily.   Melatonin 10 MG TABS Take 30 mg by mouth at bedtime.   nitroGLYCERIN (NITROSTAT) 0.4 MG SL tablet Place 1 tablet (0.4 mg total) under the tongue every 5 (five) minutes as needed for chest pain. For chest pain   omeprazole (PRILOSEC) 40 MG capsule TAKE 1 CAPSULE BY MOUTH 2 TIMES DAILY.   OXYGEN Inhale 4 L/min into the lungs as needed (DURING ALL TIMES OF EXERTION).    pilocarpine (PILOCAR) 1 % ophthalmic solution Place 1 drop into both eyes 2 (two) times daily.   potassium chloride SA (KLOR-CON) 20 MEQ tablet Take 20 mEq by mouth daily with lunch.   pramipexole (MIRAPEX) 0.25 MG tablet Take 0.25 mg by mouth daily with supper.   roflumilast (DALIRESP) 500 MCG TABS tablet Take 500 mcg by mouth daily with breakfast.   spironolactone (ALDACTONE) 25 MG tablet Take 1 tablet (25 mg total) by mouth daily.   traZODone (DESYREL) 50 MG tablet Take 150 mg by mouth at bedtime.    vitamin C (ASCORBIC ACID) 500 MG tablet Take 500 mg by mouth daily with lunch.   XTAMPZA ER 9 MG C12A Take 9 mg by mouth 2 (two) times daily.   Facility-Administered Encounter Medications as of 10/18/2021  Medication   0.9 %  sodium chloride infusion    Functional Status:  In your present state of health, do you have any difficulty performing the following activities: 05/11/2021 04/29/2021  Hearing? N N  Vision? N N  Difficulty concentrating or making decisions? Y Y  Comment - -  Walking or climbing stairs? Y Y  Comment - -  Dressing or bathing? N N  Comment - -  Doing errands, shopping? [place] and eating ? - -  Comment - -  Using the Toilet? - -  In the past six months, have you accidently leaked urine? - -  Do you have problems with loss of bowel control? - -  Managing your Medications? - -  Comment - -  Managing your Finances? - -  Comment - -  Housekeeping or managing your Housekeeping? - -  Comment - -  Some recent data might be  hidden    Fall/Depression Screening: Fall Risk  08/12/2021 02/20/2021 01/10/2021  Falls in the past year? 0 0 0  Comment - - -  Number falls in past yr: - - -  Injury with Fall? - - -  Risk for fall due to : - - -  Follow up - - -   PHQ 2/9 Scores 08/12/2021 01/10/2021 11/07/2020 08/15/2020 03/30/2020 10/11/2019 10/12/2018  PHQ - 2 Score 0 0 0 1 0 2 0  PHQ- 9 Score - - - - - 17 -    Assessment:   Care Plan Care Plan : Heart Failure (Adult)  Updates made by Jon Billings, RN since 10/18/2021 12:00 AM     Problem: Symptom Exacerbation (Heart Failure)      Long-Range Goal: Symptom Exacerbation Prevented or Minimized as evidenced by no exacerbation of heart failure   Start Date: 11/07/2020  Expected End Date: 12/21/2021  This Visit's Progress: On track  Recent Progress: On track  Priority: High  Note:   Evidence-based guidance:  Establish a mutually-agreed-upon early intervention process to communicate  with primary care provider when signs/symptoms worsen.  Notes:     Task: Identify and Minimize Risk of Heart Failure Exacerbation   Due Date: 06/20/2022  Priority: Routine  Responsible User: Jon Billings, RN  Note:   Care Management Activities:    - healthy lifestyle promoted - rescue (action) plan reviewed - self-awareness of signs/symptoms of worsening disease encouraged    Notes: 02/20/21 patient able to reports when to notify physician. 03/22/21 Patient weight today 170 lbs. Patient states she has notified the doctor of her weights and is to call today with update.  Patient with increase of lasix recently.  Discussed diet of low salt.  05/06/21 Patient with recent heart failure exacerbation.  Weight today 168 lbs. Patient has follow ups to schedule with heart doctor and PCP.  Family assists with appointments.   06/19/21 Patient weight 170 lbs.  Family active with care. Patient stays with son and family at night. Daughter in law does the cooking. Patient reports shortness of  breath not as bad but feels she is in a good place.  Discussed palliative care with patient and what that means.   Heart Failure Management Re-enforced Please weight daily or as ordered by your doctor Report to your doctor weight gain of 2 -3 pounds in a day or 5 pounds in a week Limit salt intake Monitor for shortness of breath, swelling of feet, ankles or abdomen and weight gain. Never use the salt shaker.  Read all food labels and avoid canned, processed and pickled foods.   Follow your doctor's recommendations for daily salt intake.  07/10/21 Patient weight 171 lbs.  Continued education about heart failure management.  Patient knows to call the cardiologist for changes such as weight gain, increased shortness of breath, or swelling.  08/12/21 Patient continues to weigh daily. Weight 160 lbs. Patient reports no swelling.  Reiterated heart failure management and encouraged to continue the great work.  09/11/21 Patient reports she is doing ok.  Getting over [place]. Reports only having problems with stomach upset. Weight 151 lbs. Patient reports that her appetite has not been the best.  Discussed importance of maintaining nutrition.  Reiterated heart failure management and physician follow up.     10/18/21 Patient reports that her weight is 160 lbs and reports some swelling to feet.  Discussed with patient heart failure and need to notify physician. She states that it will get better.  She states she has an appointment coming up soon and will wait until then.  Reiterated heart failure management and importance of notifying physician if she gains more than 2-3 lbs in a day. She verbalized understanding.        Goals Addressed               This Visit's Progress     THN-Track and Manage Fluids and Swelling (pt-stated)   On track     Barriers: Knowledge Timeframe:  Long-Range Goal Priority:  High Start Date:   12/20/20                          Expected End Date:06/20/22 Follow up  11/20/21   - use salt in moderation - watch for swelling in feet, ankles and legs every day - weigh myself daily    Why is this important?   It is important to check your weight daily and watch how much salt and liquids you have.  It will help you to manage your heart failure.    Notes: Patient weighs daily. 12/20/20 173 lbs 01/10/21- 171 lbs. 02/20/21 patient weight 165 lbs  03/22/21 patient weight today 170 lbs but patient reports weight up to 176 lbs earlier this week and MD increased her lasix. Patient states she has to call to report her weight today.  Reiterated the importance of a low salt diet.   05/06/21 Patient weight 168 lbs.  Reiterated heart failure management.   06/19/21 Patient weight 170 lbs.   Heart Failure Management Re-inforced Please weight daily or as ordered by your doctor Report to your doctor weight gain of 2 -3 pounds in a day or 5 pounds in a week Limit salt intake Monitor for shortness of breath, swelling of feet, ankles or abdomen and weight gain. Never use the salt shaker.  Read all food labels and avoid canned, processed and pickled foods.   Follow your doctor's recommendations for daily salt intake.  07/10/21 Patient weight 171 lbs.  Continued education about heart failure management. Patient knows to call the cardiologist for changes such as weight gain, increased shortness of breath, or swelling.  08/12/21 Patient continues to weigh daily. Weight 160 lbs. Patient reports no swelling.  Reiterated heart failure management and encouraged to continue the great work. 09/11/21 Patient weight down to 151 lbs.  Appetite is not as good as she recently had COVID and is getting over it.  Discussed importance of maintaining nutrition.  Reiterated heart failure management and importance of physician follow up.  No concerns.  10/18/21 Patient reports that her weight is up to 160 lbs and that she has some swelling to feet. Discussed notifying the physician. She feels like  it will  get better and that she has an appointment coming up soon. Reiterated the importance of notifying the physician if she gains more than 2-3 lbs in a day. She verbalized understanding and says she would call physician if needed.         Plan:  Follow-up: Patient agrees to Care Plan and Follow-up. Follow-up in 4-6 week(s)  [name], RN, MSN [place] [facility] Care Management Coordinator Direct Line [phone] Cell [phone] Toll Free: [phone]  Fax: [phone]

## 2021-10-25 DIAGNOSIS — J449 Chronic obstructive pulmonary disease, unspecified: Secondary | ICD-10-CM | POA: Diagnosis not present

## 2021-10-31 NOTE — Progress Notes (Deleted)
Cardiology Office Note   Date:  10/31/2021   ID:  [name], [place] 14-Jun-1941, MRN [id number]  PCP:  Judy Roger, MD  Cardiologist:   Dorris Carnes, MD     [name] is a 80 y.o. female with CAD, diastolic CHF, CV dz, DVT/PE, GI bleeding, PAF, COPD who is being admitted due to increased SOB    History of Present Illness:    [name]  is a 80 y.o. female with:  CAD (s/p CABG 2004; DES to LM in 2010; myovue in 2018:  Low risk with distal anterior and apical ischemia), chronic diastolic CHF, CV dz, PAD recurrent DVT/PE, GI bleeding on anticoagulation  COPD, PAF (after CABG), GERD, HL, HTN, renal insufficiency, sleep apnea (not on CPAP)       The pt was admitted to [facility] with sepsis/pneumonia. In October 2021  She came back to the ED in November 2021 with hypotension, weakness   Renal funciton was down  Cr 2.2   Diuretics held initially  She wasthe  diuresed and then eventually sent home on oral lasix 40 bid.     The pt was seen by Tonny Branch a few days after d/c   Was feeling good at that visit   Said her breathing was OK    On review of records, she continued to follow with [facility] after this visti  Was on lasix    At some point it appears that it may have been stopped   I saw hier eariler in march   Was very short of breath and edematous   I recomm she get admitted for IV diuresis    She left the hosp on 02/07/21   I saw the tp in March 2022 at that time she was doing better.  I reinforced daily weights.  Patient presented to the emergency room on 5 9 with 2 days worsening shortness of breath productive cough of yellow sputum.  She was admitted for a COPD exacerbation, diastolic CHF exacerbation. Patient was treated with IV steroids, IV Lasix, doxycycline, nebulizers.  She was tapered down and went home on 514.  (3 L O2, 94% sat).  Since discharge the patient has continued to dwindle.  She still is short of breath.  She still notes lower extremity edema.   She was seen by Dr. Blenda Mounts her primary physician on Wednesday.  He felt she had left the hospital too early as she was still wheezing and edematous.  He gave her an injection of Solu-Medrol IM.  He also increased her Lasix to 80 mg a.m. 40 mg p.m.  Since being seen in internal medicine clinic she continues to feel poorly she says she is still short of breath, she is tired, she still has lower extremity edema.  I sawa the pt in May 2022   She was seen in cardiology clinic in Aug 2022  No outpatient medications have been marked as taking for the 11/01/21 encounter (Appointment) with Fay Records, MD.   Current Facility-Administered Medications for the 11/01/21 encounter (Appointment) with Fay Records, MD  Medication   0.9 %  sodium chloride infusion     Allergies:   Ativan [lorazepam], Pitavastatin, Ropinirole, Zofran [ondansetron hcl], Zolpidem tartrate, and Penicillins   Past Medical History:  Diagnosis Date   Anemia    bld. transfusion post lumbar surgery- 2012   Anxiety    Arthralgia    NOS   Blood transfusion 2012; 02/2018  WITH BACK SURGERY; "related to hematoma in stomach" (10/06/2018)   CAD (coronary artery disease)    s/p CABG 2004; s/p DES to LM in 2010;  [facility] 10/29/11: EF 50-55%, mild elevated filling pressures, no pulmonary HTN, LM 90% ISR, LAD and CFX occluded, S-RI occluded (old), S-OM3 ok and L-LAD ok, native nondominant RCA 95% -  med rx recommended ; Lexiscan Myoview 7/13 at [facility]: demonstrated "normal LV function, anterior attenuation and localized ischemia, inferior, basilar, mid section"   Carotid artery disease ([place])    Carotid US 2/97:  RICA 9-89; LICA 21-19; R subclavian stenosis - Repeat 1 year. // Carotid US 05/2019: R 1-39; L 40-59; R vertebral with atypical antegrade flow; R subclavian stenosis >> repeat 1 year // Carotid US 7/21: Bilat 40-59; L subclavian stenosis // Carotid US 7/22: Bilat ICA 40-59; R subclavian stenosis   CHF (congestive heart  failure) ([place]) 01/21/2020   Chronic diastolic heart failure (HCC)    Echo 9/10: EF 41-74%, grade 1 diastolic dysfunction   Chronic lower back pain    COPD (chronic obstructive pulmonary disease) ([place])    Emphysema dxed by Dr. Woody Seller in [place] based on PFTs per pt in 2006; placed on albuterol   Depression    TAKES CELEXA  AND  (OFF- WELBUTRIN)   Diuretic-induced hypokalemia 10/08/2018   DVT of lower extremity (deep venous thrombosis) (HCC)    recurrent. bilateral (2 episodes)   Dyspnea    home o2 when needed   Dysrhythmia    afib with cabg   Exertional angina (HCC)    Treated with Isosorbide, Ranexa, amlodipine; intolerant to metoprolol   GERD (gastroesophageal reflux disease)    Gout    "on daily RX" (10/06/2018)   HLD (hyperlipidemia)    Hypertension    Hyperthyroidism    L Subclavian Artery Stenosis    Carotid US 7/21    Obesity (BMI 30-39.9) 2009   BMI 33   Osteoarthritis    "all over; hands" (10/06/2018)   Oxygen deficiency    Oxygen dependent    4L at home as needed (10/06/2018)   Pneumonia    PVD (peripheral vascular disease) ([place])    Sleep apnea 2012   USED CPAP THEN  STARTED USING SPIRIVA , Nov. 2013- last evaluation , changed from mask to aparatus that is just for her nose.. ; reports 05-28-18 "the mask smothers me so i dont use it right now" (10/06/2018)    Past Surgical History:  Procedure Laterality Date   ANKLE SURGERY  2004   ANTERIOR CERVICAL DECOMP/DISCECTOMY FUSION  11/2007   [place]   lower; another scheduled, opt. reports 4 back- lumbar, 3 cerv. fusions  for later 2009   CARDIAC CATHETERIZATION  11/2009   Patent LIMA to LAD and patent SVG to OM1. Occluded SVG to ramus and diagonal. Left main: 90% ostial stenosis, LCX 60-70% proximal stenosis   CATARACT EXTRACTION W/ INTRAOCULAR LENS  IMPLANT, BILATERAL Bilateral 2006   COLONOSCOPY WITH PROPOFOL N/A 02/17/2017   Procedure: COLONOSCOPY WITH PROPOFOL;  Surgeon: Jerene Bears, MD;  Location: [facility] ENDOSCOPY;  Service: Endoscopy;  Laterality: N/A;   CORONARY ANGIOPLASTY WITH STENT PLACEMENT  11/2009   Drug eluting stent to left main artery: 4.0 X 12 mm Ion    CORONARY ARTERY BYPASS GRAFT  2004   "CABG X4"   ESOPHAGOGASTRODUODENOSCOPY N/A 02/14/2017   Procedure: ESOPHAGOGASTRODUODENOSCOPY (EGD);  Surgeon: Doran Stabler, MD;  Location:  [facility] ENDOSCOPY;  Service: Endoscopy;  Laterality: N/A;   GIVENS CAPSULE STUDY N/A 02/15/2017   Procedure: GIVENS CAPSULE STUDY;  Surgeon: Doran Stabler, MD;  Location: [place];  Service: Endoscopy;  Laterality: N/A;   GROIN MASS OPEN BIOPSY  2004   IR IVC FILTER PLMT / S&I /IMG GUID/MOD SED  03/14/2018   IR PARACENTESIS  03/10/2018   JOINT REPLACEMENT     LAPAROSCOPIC CHOLECYSTECTOMY     PERIPHERAL VASCULAR INTERVENTION Left 03/05/2018   Procedure: PERIPHERAL VASCULAR INTERVENTION;  Surgeon: Elam Dutch, MD;  Location: [place] [facility];  Service: Cardiovascular;  Laterality: Left;  Attempted unsuccess\ful Per Dr. Eden Lathe   PERIPHERAL VASCULAR INTERVENTION  03/08/2018   Procedure: PERIPHERAL VASCULAR INTERVENTION;  Surgeon: Waynetta Sandy, MD;  Location: [place] [facility];  Service: Cardiovascular;;  SMA Stent    REPLACEMENT TOTAL KNEE BILATERAL Bilateral 2012   REVERSE SHOULDER ARTHROPLASTY  02/26/2012   Procedure: REVERSE SHOULDER ARTHROPLASTY;  Surgeon: Marin Shutter, MD;  Location: [place];  Service: Orthopedics;  Laterality: Right;  RIGHT SHOULDER REVERSED ARTHROPLASTY   SHOULDER ARTHROSCOPY WITH SUBACROMIAL DECOMPRESSION Left 02/10/2013   Procedure: SHOULDER ARTHROSCOPY WITH SUBACROMIAL DECOMPRESSION DISTAL CLAVICLE RESECTION;  Surgeon: Marin Shutter, MD;  Location: [place];  Service: Orthopedics;  Laterality: Left;  DISTAL CLAVICLE RESECTION   TONSILLECTOMY     TOTAL HIP ARTHROPLASTY  2007   Right   TOTAL HIP ARTHROPLASTY Left 02/07/2020   Procedure: TOTAL HIP ARTHROPLASTY ANTERIOR APPROACH;  Surgeon:  Paralee Cancel, MD;  Location: [facility];  Service: Orthopedics;  Laterality: Left;  70 mins   TRANSURETHRAL RESECTION OF BLADDER TUMOR N/A 05/31/2018   Procedure: TRANSURETHRAL RESECTION OF BLADDER TUMOR (TURBT);  Surgeon: Lucas Mallow, MD;  Location: [facility];  Service: Urology;  Laterality: N/A;   VISCERAL ANGIOGRAPHY N/A 03/05/2018   Procedure: VISCERAL ANGIOGRAPHY;  Surgeon: Elam Dutch, MD;  Location: [place] [facility];  Service: Cardiovascular;  Laterality: N/A;   VISCERAL ANGIOGRAPHY N/A 03/08/2018   Procedure: VISCERAL ANGIOGRAPHY;  Surgeon: Waynetta Sandy, MD;  Location: [place] [facility];  Service: Cardiovascular;  Laterality: N/A;     Social History:  The patient  reports that she quit smoking about 22 years ago. Her smoking use included cigarettes. She has a 45.00 pack-year smoking history. She has never used smokeless tobacco. She reports that she does not drink alcohol and does not use drugs.   Family History:  The patient's family history includes COPD in her father; Colitis in her sister; Heart disease in her father and sister; Lung cancer in her father; Ovarian cancer in her mother; Stomach cancer in her sister.    ROS:  Please see the history of present illness. All other systems are reviewed and  Negative to the above problem except as noted.    PHYSICAL EXAM: VS:  There were no vitals taken for this visit.  GEN: Thin 80 year old woman examined in chair.  On O2 (3 L).  Appears fatigued HEENT: normal  Neck: no obvious JVD while sitting in chair.  Is using accessory muscle some. Cardiac: RRR; no murmurs, rubs, or gallops, 1+ LE edema  Respiratory: Decreased airflow bilaterally with wheezes.  Mild crackles at bases. GI: soft, nontender, nondistended, + BS  No hepatomegaly  MS: no deformity Moving all extremities   Skin: warm and dry, no rash Neuro:  CN II to XII intact   Psych: euthymic mood, full affect   EKG:  EKG  is not  ordered today.   Lipid  Panel    Component Value Date/Time   CHOL 81 05/10/2021 2305   CHOL 89 (L) 03/08/2020 1158   CHOL 98 05/23/2014 0857   TRIG 22 05/10/2021 2305   TRIG 83 05/23/2014 0857   HDL 47 05/10/2021 2305   HDL 37 (L) 03/08/2020 1158   HDL 35 (L) 05/23/2014 0857   CHOLHDL 1.7 05/10/2021 2305   VLDL 4 05/10/2021 2305   LDLCALC 30 05/10/2021 2305   LDLCALC 36 03/08/2020 1158   LDLCALC 46 05/23/2014 0857      Wt Readings from Last 3 Encounters:  07/23/21 171 lb 6.4 oz (77.7 kg)  07/23/21 172 lb 3.2 oz (78.1 kg)  06/20/21 170 lb 12.8 oz (77.5 kg)      ASSESSMENT AND PLAN: 1.  COPD.  Patient's breathing still is not good.  I have listened to her in the past when it was much better.  I have spoken to her primary physician again on the phone today reviewed this both he and I are concerned about continued outpatient treatment she has had renal dysfunction develop with continued IV diuresis.  We both recommend that she return to the emergency room for further evaluation possible admission.  Note Dr. Blenda Mounts did do blood work on Wednesday white count was elevated as expected with steroids but there was more of a left shift.  He is concerned could there be any atypical infection.  2 Acute on chronic diastllic CHF volume is up today on exam.  Again she would benefit from IV diuresis I think as well as close lab evaluation during that time.  3 CAD  Last intervention in 2010  Myovue normal in  2018   I am not convinced of active angina.  4  Hx DVT  s/p IVC filter   4  HTN  BP is controlled     6  HL    7  CKD   Current medicines are reviewed at length with the patient today.  The patient does not have concerns regarding medicines.  Signed, Dorris Carnes, MD  10/31/2021 11:10 PM    [facility] [place], [place], [place]  [address] Phone: [phone]; Fax: [phone]

## 2021-11-01 ENCOUNTER — Ambulatory Visit: Payer: Medicare HMO | Admitting: Internal Medicine

## 2021-11-04 DIAGNOSIS — G894 Chronic pain syndrome: Secondary | ICD-10-CM | POA: Diagnosis not present

## 2021-11-04 DIAGNOSIS — J449 Chronic obstructive pulmonary disease, unspecified: Secondary | ICD-10-CM | POA: Diagnosis not present

## 2021-11-04 DIAGNOSIS — I5032 Chronic diastolic (congestive) heart failure: Secondary | ICD-10-CM | POA: Diagnosis not present

## 2021-11-11 ENCOUNTER — Other Ambulatory Visit: Payer: Self-pay

## 2021-11-11 NOTE — Patient Instructions (Addendum)
PATIENT INSTRUCTIONS Take medications as prescribed   Attend all scheduled provider appointments Call provider office for new concerns or questions  call office if I gain more than 2 pounds in one day or 5 pounds in one week track weight in diary use salt in moderation watch for swelling in feet, ankles and legs every day weigh myself daily

## 2021-11-11 NOTE — Patient Outreach (Signed)
[facility]) Care Management  [place]  11/11/2021   Cicely Ortner 08-08-1941 [id number]  Subjective: Telephone call to patient for follow up. Patient continues to do well.  Patient weight 160 lbs.  Heart Failure management continues.  No concerns.     Objective:   Encounter Medications:  Outpatient Encounter Medications as of 11/11/2021  Medication Sig   albuterol (PROVENTIL) (2.5 MG/3ML) 0.083% nebulizer solution Take 2.5 mg by nebulization every 6 (six) hours as needed for wheezing or shortness of breath.   allopurinol (ZYLOPRIM) 100 MG tablet Take 100 mg by mouth daily with breakfast.   ARIPiprazole (ABILIFY) 2 MG tablet Take 2 mg by mouth daily with breakfast.   aspirin EC 81 MG tablet Take 81 mg by mouth daily with breakfast. Swallow whole.   atorvastatin (LIPITOR) 20 MG tablet Take 20 mg by mouth daily with supper.   cholecalciferol (VITAMIN D3) 25 MCG (1000 UNIT) tablet Take 1,000 Units by mouth daily with supper.   cyclobenzaprine (FLEXERIL) 10 MG tablet Take 10 mg by mouth daily.   dapagliflozin propanediol (FARXIGA) 10 MG TABS tablet Take 1 tablet (10 mg total) by mouth daily before breakfast.   ferrous sulfate 325 (65 FE) MG tablet Take 325 mg by mouth daily with breakfast.   Fluticasone-Umeclidin-Vilant (TRELEGY ELLIPTA) 200-62.5-25 MCG/INH AEPB Inhale 1 puff into the lungs daily.   furosemide (LASIX) 80 MG tablet Take 1 tablet (80 mg total) by mouth daily.   furosemide (LASIX) 80 MG tablet Take daily as needed in the afternoon for a weight gain of 2 pounds overnight or 5 pounds in 1 week   gabapentin (NEURONTIN) 100 MG capsule Take 100 mg by mouth daily with breakfast.   Ipratropium-Albuterol (COMBIVENT RESPIMAT) 20-100 MCG/ACT AERS respimat Inhale 1 puff into the lungs 3 (three) times daily as needed for wheezing or shortness of breath.   levothyroxine (SYNTHROID) 50 MCG tablet TAKE 1 TABLET BY MOUTH DAILY AT 6AM.   magnesium gluconate  (MAGONATE) 500 MG tablet Take 500 mg by mouth daily.   Melatonin 10 MG TABS Take 30 mg by mouth at bedtime.   nitroGLYCERIN (NITROSTAT) 0.4 MG SL tablet Place 1 tablet (0.4 mg total) under the tongue every 5 (five) minutes as needed for chest pain. For chest pain   omeprazole (PRILOSEC) 40 MG capsule TAKE 1 CAPSULE BY MOUTH 2 TIMES DAILY.   OXYGEN Inhale 4 L/min into the lungs as needed (DURING ALL TIMES OF EXERTION).    pilocarpine (PILOCAR) 1 % ophthalmic solution Place 1 drop into both eyes 2 (two) times daily.   potassium chloride SA (KLOR-CON) 20 MEQ tablet Take 20 mEq by mouth daily with lunch.   pramipexole (MIRAPEX) 0.25 MG tablet Take 0.25 mg by mouth daily with supper.   roflumilast (DALIRESP) 500 MCG TABS tablet Take 500 mcg by mouth daily with breakfast.   spironolactone (ALDACTONE) 25 MG tablet Take 1 tablet (25 mg total) by mouth daily.   traZODone (DESYREL) 50 MG tablet Take 150 mg by mouth at bedtime.    vitamin C (ASCORBIC ACID) 500 MG tablet Take 500 mg by mouth daily with lunch.   XTAMPZA ER 9 MG C12A Take 9 mg by mouth 2 (two) times daily.   Facility-Administered Encounter Medications as of 11/11/2021  Medication   0.9 %  sodium chloride infusion    Functional Status:  In your present state of health, do you have any difficulty performing the following activities: 11/11/2021 05/11/2021  Hearing? N  N  Vision? N N  Difficulty concentrating or making decisions? [name]  Comment forgets things at times -  Walking or climbing stairs? Y Y  Comment weakness at times- family assists -  Dressing or bathing? Y N  Comment granddaughter helps -  Doing errands, shopping? [name]  Comment family helps -  Conservation officer, nature and eating ? Y -  Comment family helps -  Using the Toilet? N -  In the past six months, have you accidently leaked urine? N -  Do you have problems with loss of bowel control? N -  Managing your Medications? Y -  Comment family helps -  Managing your Finances? Y -   Comment family helps -  Housekeeping or managing your Housekeeping? Y -  Comment family helps -  Some recent data might be hidden    Fall/Depression Screening: Fall Risk  11/11/2021 08/12/2021 02/20/2021  Falls in the past year? 0 0 0  Comment - - -  Number falls in past yr: - - -  Injury with Fall? - - -  Risk for fall due to : - - -  Follow up - - -   PHQ 2/9 Scores 11/11/2021 08/12/2021 01/10/2021 11/07/2020 08/15/2020 03/30/2020 10/11/2019  PHQ - 2 Score 0 0 0 0 1 0 2  PHQ- 9 Score - - - - - - 17    Assessment:   Care Plan Care Plan : General Plan of Care (Adult)  Updates made by Jon Billings, RN since 11/11/2021 12:00 AM  Completed 11/11/2021   Problem: Quality of Life (General Plan of Care) Resolved 11/11/2021  Onset Date: 02/20/2021     Care Plan : Heart Failure (Adult)  Updates made by Jon Billings, RN since 11/11/2021 12:00 AM  Completed 11/11/2021   Problem: Symptom Exacerbation (Heart Failure) Resolved 11/11/2021     Long-Range Goal: Symptom Exacerbation Prevented or Minimized as evidenced by no exacerbation of heart failure Completed 11/11/2021  Start Date: 11/07/2020  Expected End Date: 12/21/2021  Recent Progress: On track  Priority: High  Note:   Evidence-based guidance:  Establish a mutually-agreed-upon early intervention process to communicate  with primary care provider when signs/symptoms worsen.  [id number]:14/78/29 Resolving Duplicate goal    Task: Identify and Minimize Risk of Heart Failure Exacerbation Completed 11/11/2021  Due Date: 06/20/2022  Priority: Routine  Responsible User: Jon Billings, RN  Note:   Care Management Activities:    - healthy lifestyle promoted - rescue (action) plan reviewed - self-awareness of signs/symptoms of worsening disease encouraged    Notes: 02/20/21 patient able to reports when to notify physician. 03/22/21 Patient weight today 170 lbs. Patient states she has notified the doctor of her weights and is to call today  with update.  Patient with increase of lasix recently.  Discussed diet of low salt.  05/06/21 Patient with recent heart failure exacerbation.  Weight today 168 lbs. Patient has follow ups to schedule with heart doctor and PCP.  Family assists with appointments.   06/19/21 Patient weight 170 lbs.  Family active with care. Patient stays with son and family at night. Daughter in law does the cooking. Patient reports shortness of breath not as bad but feels she is in a good place.  Discussed palliative care with patient and what that means.   Heart Failure Management Re-enforced Please weight daily or as ordered by your doctor Report to your doctor weight gain of 2 -3 pounds in a day or 5 pounds in a week  Limit salt intake Monitor for shortness of breath, swelling of feet, ankles or abdomen and weight gain. Never use the salt shaker.  Read all food labels and avoid canned, processed and pickled foods.   Follow your doctor's recommendations for daily salt intake.  07/10/21 Patient weight 171 lbs.  Continued education about heart failure management. Patient knows to call the cardiologist for changes such as weight gain, increased shortness of breath, or swelling.  08/12/21 Patient continues to weigh daily. Weight 160 lbs. Patient reports no swelling.  Reiterated heart failure management and encouraged to continue the great work.  09/11/21 Patient reports she is doing ok.  Getting over [place]. Reports only having problems with stomach upset. Weight 151 lbs. Patient reports that her appetite has not been the best.  Discussed importance of maintaining nutrition.  Reiterated heart failure management and physician follow up.     10/18/21 Patient reports that her weight is 160 lbs and reports some swelling to feet.  Discussed with patient heart failure and need to notify physician. She states that it will get better.  She states she has an appointment coming up soon and will wait until then.  Reiterated heart failure  management and importance of notifying physician if she gains more than 2-3 lbs in a day. She verbalized understanding.      Care Plan : RN Care Manager Plan of Care  Updates made by Jon Billings, RN since 11/11/2021 12:00 AM     Problem: [place] Disease Management and Care Coordination Needs of Heart Failure   Priority: High     Long-Range Goal: Development of Plan of Care for Management of Heart Failure   Start Date: 11/11/2021  Expected End Date: 11/20/2022  Priority: High  Note:   Current Barriers:  Chronic Disease Management support and education needs related to CHF  RNCM Clinical Goal(s):  Patient will verbalize basic understanding of CHF disease process and self health management plan as evidenced by no acute exacerbation of Heart Failure take all medications exactly as prescribed and will call provider for medication related questions as evidenced by patient reporting    continue to work with [place] and/or Social Worker to address care management and care coordination needs related to CHF as evidenced by adherence to CM Team Scheduled appointments     through collaboration with Consulting civil engineer, provider, and care team.   Interventions: Education and support for management of heart failure Inter-disciplinary care team collaboration (see longitudinal plan of care) Evaluation of current treatment plan related to  self management and patient's adherence to plan as established by provider    Patient Goals/Self-Care Activities: Take medications as prescribed   Attend all scheduled provider appointments Call provider office for new concerns or questions  call office if I gain more than 2 pounds in one day or 5 pounds in one week track weight in diary use salt in moderation watch for swelling in feet, ankles and legs every day weigh myself daily        Goals Addressed               This Visit's Progress     COMPLETED: THN-Track and Manage Fluids and  Swelling (pt-stated)   On track     Barriers: Knowledge Timeframe:  Long-Range Goal Priority:  High Start Date:   12/20/20                          Expected End  Date:06/20/22 Follow up 11/20/21   - use salt in moderation - watch for swelling in feet, ankles and legs every day - weigh myself daily    Why is this important?   It is important to check your weight daily and watch how much salt and liquids you have.  It will help you to manage your heart failure.    Notes: Patient weighs daily. 12/20/20 173 lbs 01/10/21- 171 lbs. 02/20/21 patient weight 165 lbs  03/22/21 patient weight today 170 lbs but patient reports weight up to 176 lbs earlier this week and MD increased her lasix. Patient states she has to call to report her weight today.  Reiterated the importance of a low salt diet.   05/06/21 Patient weight 168 lbs.  Reiterated heart failure management.   06/19/21 Patient weight 170 lbs.   Heart Failure Management Re-inforced Please weight daily or as ordered by your doctor Report to your doctor weight gain of 2 -3 pounds in a day or 5 pounds in a week Limit salt intake Monitor for shortness of breath, swelling of feet, ankles or abdomen and weight gain. Never use the salt shaker.  Read all food labels and avoid canned, processed and pickled foods.   Follow your doctor's recommendations for daily salt intake.  07/10/21 Patient weight 171 lbs.  Continued education about heart failure management. Patient knows to call the cardiologist for changes such as weight gain, increased shortness of breath, or swelling.  08/12/21 Patient continues to weigh daily. Weight 160 lbs. Patient reports no swelling.  Reiterated heart failure management and encouraged to continue the great work. 09/11/21 Patient weight down to 151 lbs.  Appetite is not as good as she recently had COVID and is getting over it.  Discussed importance of maintaining nutrition.  Reiterated heart failure management and importance of  physician follow up.  No concerns.  10/18/21 Patient reports that her weight is up to 160 lbs and that she has some swelling to feet. Discussed notifying the physician. She feels like it will get better and that she has an appointment coming up soon. Reiterated the importance of notifying the physician if she gains more than 2-3 lbs in a day. She verbalized understanding and says she would call physician if needed.  72/09/47 Resolving duplicate goal        Plan:  Follow-up: Patient agrees to Care Plan and Follow-up. Follow-up in 4-6 week(s)  [name], RN, MSN [place] [facility] Care Management Coordinator Direct Line [phone] Cell [phone] Toll Free: [phone]  Fax: [phone]

## 2021-11-20 ENCOUNTER — Other Ambulatory Visit: Payer: Self-pay | Admitting: Internal Medicine

## 2021-12-05 ENCOUNTER — Other Ambulatory Visit: Payer: Self-pay

## 2021-12-05 NOTE — Patient Instructions (Signed)
°  Patient Goals/Self-Care Activities: Heart Failure call office if I gain more than 2 pounds in one day or 5 pounds in one week track weight in diary use salt in moderation watch for swelling in feet, ankles and legs every day weigh myself daily follow rescue plan if symptoms flare-up 

## 2021-12-05 NOTE — Patient Outreach (Signed)
[facility]) Care Management  [place]  12/05/2021   [name] [birth year] [id number]  Subjective: Patient reports she is doing real good.  Encouraged to continue the great work with her heart failure.  No concerns.   Objective:   Encounter Medications:  Outpatient Encounter Medications as of 12/05/2021  Medication Sig   albuterol (PROVENTIL) (2.5 MG/3ML) 0.083% nebulizer solution Take 2.5 mg by nebulization every 6 (six) hours as needed for wheezing or shortness of breath.   allopurinol (ZYLOPRIM) 100 MG tablet Take 100 mg by mouth daily with breakfast.   ARIPiprazole (ABILIFY) 2 MG tablet Take 2 mg by mouth daily with breakfast.   aspirin EC 81 MG tablet Take 81 mg by mouth daily with breakfast. Swallow whole.   atorvastatin (LIPITOR) 20 MG tablet Take 20 mg by mouth daily with supper.   cholecalciferol (VITAMIN D3) 25 MCG (1000 UNIT) tablet Take 1,000 Units by mouth daily with supper.   cyclobenzaprine (FLEXERIL) 10 MG tablet Take 10 mg by mouth daily.   dapagliflozin propanediol (FARXIGA) 10 MG TABS tablet Take 1 tablet (10 mg total) by mouth daily before breakfast.   ferrous sulfate 325 (65 FE) MG tablet Take 325 mg by mouth daily with breakfast.   Fluticasone-Umeclidin-Vilant (TRELEGY ELLIPTA) 200-62.5-25 MCG/INH AEPB Inhale 1 puff into the lungs daily.   furosemide (LASIX) 80 MG tablet Take 1 tablet (80 mg total) by mouth daily.   furosemide (LASIX) 80 MG tablet Take daily as needed in the afternoon for a weight gain of 2 pounds overnight or 5 pounds in 1 week   gabapentin (NEURONTIN) 100 MG capsule Take 100 mg by mouth daily with breakfast.   Ipratropium-Albuterol (COMBIVENT RESPIMAT) 20-100 MCG/ACT AERS respimat Inhale 1 puff into the lungs 3 (three) times daily as needed for wheezing or shortness of breath.   levothyroxine (SYNTHROID) 50 MCG tablet TAKE 1 TABLET BY MOUTH DAILY AT 6AM.   magnesium gluconate (MAGONATE) 500 MG tablet Take 500  mg by mouth daily.   Melatonin 10 MG TABS Take 30 mg by mouth at bedtime.   nitroGLYCERIN (NITROSTAT) 0.4 MG SL tablet Place 1 tablet (0.4 mg total) under the tongue every 5 (five) minutes as needed for chest pain. For chest pain   omeprazole (PRILOSEC) 40 MG capsule TAKE 1 CAPSULE BY MOUTH 2 TIMES DAILY.   OXYGEN Inhale 4 L/min into the lungs as needed (DURING ALL TIMES OF EXERTION).    pilocarpine (PILOCAR) 1 % ophthalmic solution Place 1 drop into both eyes 2 (two) times daily.   potassium chloride SA (KLOR-CON) 20 MEQ tablet Take 20 mEq by mouth daily with lunch.   pramipexole (MIRAPEX) 0.25 MG tablet Take 0.25 mg by mouth daily with supper.   roflumilast (DALIRESP) 500 MCG TABS tablet Take 500 mcg by mouth daily with breakfast.   spironolactone (ALDACTONE) 25 MG tablet TAKE 1 TABLET BY MOUTH DAILY   traZODone (DESYREL) 50 MG tablet Take 150 mg by mouth at bedtime.    vitamin C (ASCORBIC ACID) 500 MG tablet Take 500 mg by mouth daily with lunch.   XTAMPZA ER 9 MG C12A Take 9 mg by mouth 2 (two) times daily.   Facility-Administered Encounter Medications as of 12/05/2021  Medication   0.9 %  sodium chloride infusion    Functional Status:  In your present state of health, do you have any difficulty performing the following activities: 11/11/2021 05/11/2021  Hearing? N N  Vision? N N  Difficulty concentrating or  making decisions? [name]  Comment forgets things at times -  Walking or climbing stairs? Y Y  Comment weakness at times- family assists -  Dressing or bathing? Y N  Comment granddaughter helps -  Doing errands, shopping? [name]  Comment family helps -  Conservation officer, nature and eating ? Y -  Comment family helps -  Using the Toilet? N -  In the past six months, have you accidently leaked urine? N -  Do you have problems with loss of bowel control? N -  Managing your Medications? Y -  Comment family helps -  Managing your Finances? Y -  Comment family helps -  Housekeeping or  managing your Housekeeping? Y -  Comment family helps -  Some recent data might be hidden    Fall/Depression Screening: Fall Risk  11/11/2021 08/12/2021 02/20/2021  Falls in the past year? 0 0 0  Comment - - -  Number falls in past yr: - - -  Injury with Fall? - - -  Risk for fall due to : - - -  Follow up - - -   PHQ 2/9 Scores 11/11/2021 08/12/2021 01/10/2021 11/07/2020 08/15/2020 03/30/2020 10/11/2019  PHQ - 2 Score 0 0 0 0 1 0 2  PHQ- 9 Score - - - - - - 17    Assessment:   Care Plan Care Plan : [place] [employer] of Care  Updates made by Jon Billings, RN since 12/05/2021 12:00 AM     Problem: [place] Disease Management and Care Coordination Needs of Heart Failure   Priority: High     Long-Range Goal: Development of Plan of Care for Management of Heart Failure   Start Date: 11/11/2021  Expected End Date: 11/20/2022  This Visit's Progress: On track  Priority: High  Note:   Current Barriers:  Knowledge Deficits related to plan of care for management of CHF  Chronic Disease Management support and education needs related to CHF   RNCM Clinical Goal(s):  Patient will verbalize basic understanding of  CHF disease process and self health management plan as evidenced by No acute Exacerbation of Heart Failure continue to work with RN Care Manager to address care management and care coordination needs related to  CHF as evidenced by adherence to CM Team Scheduled appointments through collaboration with RN Care manager, provider, and care team.   Interventions: Education and support related to heart failure Inter-disciplinary care team collaboration (see longitudinal plan of care) Evaluation of current treatment plan related to  self management and patient's adherence to plan as established by provider   Heart Failure Interventions:  (Status:  New goal.) Long Term Goal Provided education on low sodium diet Discussed importance of daily weight and advised patient to weigh and  record daily Discussed the importance of keeping all appointments with provider  12/05/21 Patient weight 157 lbs. Encouraged patient to continue great work.   Heart Failure Management Discussed Please weight daily or as ordered by your doctor Report to your doctor weight gain of 2 -3 pounds in a day or 5 pounds in a week Limit salt intake Monitor for shortness of breath, swelling of feet, ankles or abdomen and weight gain. Never use the saltshaker.  Read all food labels and avoid canned, processed, and pickled foods.   Follow your doctor's recommendations for daily salt intake  Patient Goals/Self-Care Activities: Heart Failure call office if I gain more than 2 pounds in one day or 5 pounds in one week track  weight in diary use salt in moderation watch for swelling in feet, ankles and legs every day weigh myself daily follow rescue plan if symptoms flare-up  Follow Up Plan:  Telephone follow up appointment with care management team member scheduled for:  February The patient has been provided with contact information for the care management team and has been advised to call with any health related questions or concerns.       Goals Addressed   None     Plan:  Follow-up: Patient agrees to Care Plan and Follow-up. Follow-up in 2 month(s)   [name], RN, MSN [place] [facility] Care Management Coordinator Direct Line [phone] Cell [phone] Toll Free: [phone]  Fax: [phone]

## 2022-01-13 DIAGNOSIS — N390 Urinary tract infection, site not specified: Secondary | ICD-10-CM | POA: Diagnosis not present

## 2022-02-07 DIAGNOSIS — G894 Chronic pain syndrome: Secondary | ICD-10-CM | POA: Diagnosis not present

## 2022-02-13 ENCOUNTER — Other Ambulatory Visit: Payer: Self-pay

## 2022-02-13 NOTE — Patient Instructions (Signed)
Patient Goals/Self-Care Activities: Heart Failure call office if I gain more than 2 pounds in one day or 5 pounds in one week track weight in diary use salt in moderation watch for swelling in feet, ankles and legs every day weigh myself daily follow rescue plan if symptoms flare-up 

## 2022-02-13 NOTE — Patient Outreach (Signed)
[facility]) Care Management Telephonic RN Care Manager Note   02/13/2022 Name:  [name] MRN:  [id number] DOB:  March 02, 1941  Summary: Telephone call to patient for disease management follow up. Patient reports doing well but she is getting over a cold and reports being much better.  Weight 158 lbs. No concerns.  Recommendations/Changes made from today's visit: Reiterated heart failure management. Encouraged to keep up the great work.    Subjective: [name] is an 81 y.o. year old female who is a primary patient of [name], MD. The care management team was consulted for assistance with care management and/or care coordination needs.    Telephonic RN Care Manager completed Telephone Visit today.  Objective:   Medications Reviewed Today     Reviewed by Jon Billings, RN (Case Manager) on 11/11/21 at [place] List Status: <None>   Medication Order Taking? Sig Documenting Provider Last Dose Status Informant  0.9 %  sodium chloride infusion [id number]   [name], [name], MD  Active   albuterol (PROVENTIL) (2.5 MG/3ML) 0.083% nebulizer solution [id number] Yes Take 2.5 mg by nebulization every 6 (six) hours as needed for wheezing or shortness of breath. [provider] Taking Active   allopurinol (ZYLOPRIM) 100 MG tablet [id number] Yes Take 100 mg by mouth daily with breakfast. [provider] Taking Active Self  ARIPiprazole (ABILIFY) 2 MG tablet [id number] Yes Take 2 mg by mouth daily with breakfast. [provider] Taking Active Self  aspirin EC 81 MG tablet [id number] Yes Take 81 mg by mouth daily with breakfast. Swallow whole. [provider] Taking Active Self  atorvastatin (LIPITOR) 20 MG tablet [id number] Yes Take 20 mg by mouth daily with supper. [provider] Taking Active Self  cholecalciferol (VITAMIN D3) 25 MCG (1000 UNIT) tablet [id number] Yes Take 1,000 Units by mouth daily with supper.  [provider] Taking Active Self  cyclobenzaprine (FLEXERIL) 10 MG tablet [id number] Yes Take 10 mg by mouth daily. [provider] Taking Active Self  dapagliflozin propanediol (FARXIGA) 10 MG TABS tablet [id number] Yes Take 1 tablet (10 mg total) by mouth daily before breakfast. [name] T, PA-C Taking Active   ferrous sulfate 325 (65 FE) MG tablet [id number] Yes Take 325 mg by mouth daily with breakfast. [provider] Taking Active Self  Fluticasone-Umeclidin-Vilant (TRELEGY ELLIPTA) 200-62.5-25 MCG/INH AEPB [id number] Yes Inhale 1 puff into the lungs daily. Parrett, Fonnie Mu, NP Taking Active   furosemide (LASIX) 80 MG tablet [id number] Yes Take 1 tablet (80 mg total) by mouth daily. Little Ishikawa, MD Taking Active   furosemide (LASIX) 80 MG tablet [id number] Yes Take daily as needed in the afternoon for a weight gain of 2 pounds overnight or 5 pounds in 1 week Loel Dubonnet, NP Taking Active   gabapentin (NEURONTIN) 100 MG capsule [id number] Yes Take 100 mg by mouth daily with breakfast. [provider] Taking Active Self  Ipratropium-Albuterol (COMBIVENT RESPIMAT) 20-100 MCG/ACT AERS respimat [id number] Yes Inhale 1 puff into the lungs 3 (three) times daily as needed for wheezing or shortness of breath. [provider] Taking Active Self  levothyroxine (SYNTHROID) 50 MCG tablet [id number] Yes TAKE 1 TABLET BY MOUTH DAILY AT 6AMFay Records, MD Taking Active   magnesium gluconate (MAGONATE) 500 MG tablet [id number] Yes Take 500 mg by mouth daily. [provider] Taking Active   Melatonin 10 MG TABS [id number] Yes Take 30 mg by mouth at bedtime. [provider] Taking Active Self  nitroGLYCERIN (NITROSTAT) 0.4 MG SL tablet [id number] Yes Place 1 tablet (0.4 mg total) under the tongue every 5 (five) minutes as needed for chest pain. For chest pain Fay Records, MD Taking Active Self           Med Note Rose Fillers, [place] Dolly   Tue  Feb 12, 2021  1:08 PM)    omeprazole (PRILOSEC) 40 MG capsule [id number] Yes TAKE 1 CAPSULE BY MOUTH 2 TIMES DAILY. Alfredia Ferguson, PA-C Taking Active Self  OXYGEN [id number] Yes Inhale 4 L/min into the lungs as needed (DURING ALL TIMES OF EXERTION).  [provider] Taking Active Self  pilocarpine (PILOCAR) 1 % ophthalmic solution [id number] Yes Place 1 drop into both eyes 2 (two) times daily. [provider] Taking Active Self  potassium chloride SA (KLOR-CON) 20 MEQ tablet [id number] Yes Take 20 mEq by mouth daily with lunch. [provider] Taking Active Self  pramipexole (MIRAPEX) 0.25 MG tablet [id number] Yes Take 0.25 mg by mouth daily with supper. [provider] Taking Active Self  roflumilast (DALIRESP) 500 MCG TABS tablet [id number] Yes Take 500 mcg by mouth daily with breakfast. [provider] Taking Active   spironolactone (ALDACTONE) 25 MG tablet [id number] Yes Take 1 tablet (25 mg total) by mouth daily. Loel Dubonnet, NP Taking Active   traZODone (DESYREL) 50 MG tablet [id number] Yes Take 150 mg by mouth at bedtime.  [provider] Taking Active Self           Med Note Ivor Reining Jan 26, 2020  8:28 AM)    vitamin C (ASCORBIC ACID) 500 MG tablet [id number] Yes Take 500 mg by mouth daily with lunch. [provider] Taking Active Self  XTAMPZA ER 9 MG C12A [id number] Yes Take 9 mg by mouth 2 (two) times daily. [provider] Taking Active Self           Med Note Minta Balsam Apr 29, 2021 10:10 PM)                 Care Plan  Review of patient past medical history, allergies, medications, health status, including review of consultants reports, laboratory and other test data, was performed as part of comprehensive evaluation for care management services.   Care Plan : RN Care Manager Plan of Care  Updates made by Jon Billings, RN since 02/13/2022 12:00 AM     Problem: Chroinc Disease  Management and Care Coordination Needs of Heart Failure   Priority: High     Long-Range Goal: Development of Plan of Care for Management of Heart Failure   Start Date: 11/11/2021  Expected End Date: 12/20/2022  This Visit's Progress: On track  Recent Progress: On track  Priority: High  Note:   Current Barriers:  Chronic Disease Management support and education needs related to CHF   RNCM Clinical Goal(s):  Patient will verbalize basic understanding of  CHF disease process and self health management plan as evidenced by No acute Exacerbation of Heart Failure continue to work with RN Care Manager to address care management and care coordination needs related to  CHF as evidenced by adherence to CM Team Scheduled appointments through collaboration with RN Care manager, provider, and care team.   Interventions: Education and support related to heart failure Inter-disciplinary care team collaboration (see longitudinal plan of care) Evaluation of current treatment plan related to  self management and patient's adherence  to plan as established by provider   Heart Failure Interventions:  (Status:  Goal on track:  Yes.) Long Term Goal Provided education on low sodium diet Discussed importance of daily weight and advised patient to weigh and record daily Discussed the importance of keeping all appointments with provider  02/13/22 Patient weight 158 lbs. Encouraged patient to continue great work.  Reiterated heart failure management.     Heart Failure Management Reiterated Please weight daily or as ordered by your doctor Report to your doctor weight gain of 2 -3 pounds in a day or 5 pounds in a week Limit salt intake Monitor for shortness of breath, swelling of feet, ankles or abdomen and weight gain. Never use the saltshaker.  Read all food labels and avoid canned, processed, and pickled foods.   Follow your doctor's recommendations for daily salt intake  Patient Goals/Self-Care  Activities: Heart Failure call office if I gain more than 2 pounds in one day or 5 pounds in one week track weight in diary use salt in moderation watch for swelling in feet, ankles and legs every day weigh myself daily follow rescue plan if symptoms flare-up  Follow Up Plan:  Telephone follow up appointment with care management team member scheduled for:  April The patient has been provided with contact information for the care management team and has been advised to call with any health related questions or concerns.       Plan:  Telephone follow up appointment with care management team member scheduled for:  April The patient has been provided with contact information for the care management team and has been advised to call with any health related questions or concerns.  Follow-up: Patient agrees to Care Plan and Follow-up.   [name], RN, MSN [facility] Care Management Care Management Coordinator Direct Line [phone] Toll Free: [phone]  Fax: [phone]

## 2022-02-14 ENCOUNTER — Ambulatory Visit: Payer: Self-pay

## 2022-02-24 ENCOUNTER — Ambulatory Visit (INDEPENDENT_AMBULATORY_CARE_PROVIDER_SITE_OTHER): Payer: Medicare HMO | Admitting: Internal Medicine

## 2022-02-24 ENCOUNTER — Encounter: Payer: Self-pay | Admitting: Internal Medicine

## 2022-02-24 ENCOUNTER — Other Ambulatory Visit: Payer: Self-pay

## 2022-02-24 VITALS — BP 160/62 | HR 92 | Ht 67.0 in | Wt 163.6 lb

## 2022-02-24 DIAGNOSIS — Z79899 Other long term (current) drug therapy: Secondary | ICD-10-CM

## 2022-02-24 DIAGNOSIS — I10 Essential (primary) hypertension: Secondary | ICD-10-CM

## 2022-02-24 DIAGNOSIS — E782 Mixed hyperlipidemia: Secondary | ICD-10-CM

## 2022-02-24 DIAGNOSIS — I25118 Atherosclerotic heart disease of native coronary artery with other forms of angina pectoris: Secondary | ICD-10-CM

## 2022-02-24 DIAGNOSIS — I5032 Chronic diastolic (congestive) heart failure: Secondary | ICD-10-CM

## 2022-02-24 MED ORDER — AMLODIPINE BESYLATE 2.5 MG PO TABS: ORAL_TABLET | ORAL | 1 refills | Status: AC

## 2022-02-24 NOTE — Patient Instructions (Signed)
Medication Instructions:  ?Take Amlodipine 2.5 mg, 1/2 as needed for BP greater than 160 ? ?*If you need a refill on your cardiac medications before your next appointment, please call your pharmacy* ? ? ?Lab Work: ?Bmet, pro bnp, cbc, tsh, lipid ?If you have labs (blood work) drawn today and your tests are completely normal, you will receive your results only by: ?MyChart Message (if you have MyChart) OR ?A paper copy in the mail ?If you have any lab test that is abnormal or we need to change your treatment, we will call you to review the results. ? ? ?Testing/Procedures: ?none ? ? ?Follow-Up: ?At [facility], you and your health needs are our priority.  As part of our continuing mission to provide you with exceptional heart care, we have created designated Provider Care Teams.  These Care Teams include your primary Cardiologist (physician) and Advanced Practice Providers (APPs -  Physician Assistants and Nurse Practitioners) who all work together to provide you with the care you need, when you need it. ? ?We recommend signing up for the patient portal called "MyChart".  Sign up information is provided on this After Visit Summary.  MyChart is used to connect with patients for Virtual Visits (Telemedicine).  Patients are able to view lab/test results, encounter notes, upcoming appointments, etc.  Non-urgent messages can be sent to your provider as well.   ?To learn more about what you can do with MyChart, go to [email or web address].   ? ?Your next appointment:   ?3 month(s) ? ?The format for your next appointment:   ?In Person ? ?Provider:   ?Dorris Carnes, MD   ? ? ?Other Instructions ?  ?

## 2022-02-24 NOTE — Progress Notes (Signed)
Cardiology Office Note   Date:  02/25/2022   ID:  [name], [place] May 17, 1941, MRN [id number]  PCP:  [name], MD  Cardiologist:   Dorris Carnes, MD   Pt presents for f/u of CAD    History of Present Illness: [name] is a 81 y.o. female with a history of CAD (s/p CABG 2004; DES to LM in 2010; myovue in 2018:  Low risk with distal anterior and apical ischemia), chronic diastolic CHF, CV dz, PAD recurrent DVT/PE, GI bleeding on anticoagulation  COPD, PAF (after CABG), GERD, HL, HTN, renal insufficiency, sleep apnea (not on CPAP)    Over the years she has been admitted for pneumonia, COPD exacerbations and decompensated HFpEF I last saw the pt in clinic in May 2022   Since then she has been seen by Vella Raring and last by Kathleen Argue in Aug 2022  She was dyspneic at that time     Since seen she continues to  have SOB with activity    Denies CP    Still with LE edema    Legs get wrapped Diet   Does admit to not always eating low salt    Weekends in particualr       Current Meds  Medication Sig   albuterol (PROVENTIL) (2.5 MG/3ML) 0.083% nebulizer solution Take 2.5 mg by nebulization every 6 (six) hours as needed for wheezing or shortness of breath.   allopurinol (ZYLOPRIM) 100 MG tablet Take 100 mg by mouth daily with breakfast.   amLODipine (NORVASC) 2.5 MG tablet Take 1/2 tablet as needed for BP greater than 160.   ARIPiprazole (ABILIFY) 2 MG tablet Take 2 mg by mouth daily with breakfast.   aspirin EC 81 MG tablet Take 81 mg by mouth daily with breakfast. Swallow whole.   atorvastatin (LIPITOR) 20 MG tablet Take 20 mg by mouth daily with supper.   cholecalciferol (VITAMIN D3) 25 MCG (1000 UNIT) tablet Take 1,000 Units by mouth daily with supper.   cyclobenzaprine (FLEXERIL) 10 MG tablet Take 10 mg by mouth daily.   dapagliflozin propanediol (FARXIGA) 10 MG TABS tablet Take 1 tablet (10 mg total) by mouth daily before breakfast.   ferrous sulfate 325 (65 FE) MG tablet  Take 325 mg by mouth daily with breakfast.   Fluticasone-Umeclidin-Vilant (TRELEGY ELLIPTA) 200-62.5-25 MCG/INH AEPB Inhale 1 puff into the lungs daily.   furosemide (LASIX) 80 MG tablet Take 1 tablet (80 mg total) by mouth daily.   gabapentin (NEURONTIN) 100 MG capsule Take 100 mg by mouth daily with breakfast.   Ipratropium-Albuterol (COMBIVENT RESPIMAT) 20-100 MCG/ACT AERS respimat Inhale 1 puff into the lungs 3 (three) times daily as needed for wheezing or shortness of breath.   levothyroxine (SYNTHROID) 50 MCG tablet TAKE 1 TABLET BY MOUTH DAILY AT 6AM.   magnesium gluconate (MAGONATE) 500 MG tablet Take 500 mg by mouth daily.   Melatonin 10 MG TABS Take 30 mg by mouth at bedtime.   nitroGLYCERIN (NITROSTAT) 0.4 MG SL tablet Place 1 tablet (0.4 mg total) under the tongue every 5 (five) minutes as needed for chest pain. For chest pain   Omega-3 Fatty Acids (FISH OIL) 1000 MG CPDR Take 1 capsule by mouth daily.   omeprazole (PRILOSEC) 40 MG capsule TAKE 1 CAPSULE BY MOUTH 2 TIMES DAILY.   OXYGEN Inhale 4 L/min into the lungs as needed (DURING ALL TIMES OF EXERTION).    pilocarpine (PILOCAR) 1 % ophthalmic solution Place 1 drop into  both eyes 2 (two) times daily.   potassium chloride SA (KLOR-CON) 20 MEQ tablet Take 20 mEq by mouth daily with lunch.   pramipexole (MIRAPEX) 0.25 MG tablet Take 0.25 mg by mouth daily with supper.   spironolactone (ALDACTONE) 25 MG tablet TAKE 1 TABLET BY MOUTH DAILY   traZODone (DESYREL) 50 MG tablet Take 150 mg by mouth at bedtime.    vitamin C (ASCORBIC ACID) 500 MG tablet Take 500 mg by mouth daily with lunch.   XTAMPZA ER 9 MG C12A Take 9 mg by mouth 2 (two) times daily.   Current Facility-Administered Medications for the 02/24/22 encounter (Office Visit) with Fay Records, MD  Medication   0.9 %  sodium chloride infusion     Allergies:   Ativan [lorazepam], Pitavastatin, Ropinirole, Zofran [ondansetron hcl], Zolpidem tartrate, and Penicillins   Past  Medical History:  Diagnosis Date   Anemia    bld. transfusion post lumbar surgery- 2012   Anxiety    Arthralgia    NOS   Blood transfusion 2012; 02/2018   WITH BACK SURGERY; "related to hematoma in stomach" (10/06/2018)   CAD (coronary artery disease)    s/p CABG 2004; s/p DES to LM in 2010;  [place] 10/29/11: EF 50-55%, mild elevated filling pressures, no pulmonary HTN, LM 90% ISR, LAD and CFX occluded, S-RI occluded (old), S-OM3 ok and L-LAD ok, native nondominant RCA 95% -  med rx recommended ; Lexiscan Myoview 7/13 at [facility]: demonstrated "normal LV function, anterior attenuation and localized ischemia, inferior, basilar, mid section"   Carotid artery disease ([place])    Carotid US 0/10:  RICA 2-72; LICA 53-66; R subclavian stenosis - Repeat 1 year. // Carotid US 05/2019: R 1-39; L 40-59; R vertebral with atypical antegrade flow; R subclavian stenosis >> repeat 1 year // Carotid US 7/21: Bilat 40-59; L subclavian stenosis // Carotid US 7/22: Bilat ICA 40-59; R subclavian stenosis   CHF (congestive heart failure) ([place]) 01/21/2020   Chronic diastolic heart failure (HCC)    Echo 9/10: EF 44-03%, grade 1 diastolic dysfunction   Chronic lower back pain    COPD (chronic obstructive pulmonary disease) ([place])    Emphysema dxed by Dr. Woody Seller in [place] based on PFTs per pt in 2006; placed on albuterol   Depression    TAKES CELEXA  AND  (OFF- WELBUTRIN)   Diuretic-induced hypokalemia 10/08/2018   DVT of lower extremity (deep venous thrombosis) (HCC)    recurrent. bilateral (2 episodes)   Dyspnea    home o2 when needed   Dysrhythmia    afib with cabg   Exertional angina (HCC)    Treated with Isosorbide, Ranexa, amlodipine; intolerant to metoprolol   GERD (gastroesophageal reflux disease)    Gout    "on daily RX" (10/06/2018)   HLD (hyperlipidemia)    Hypertension    Hyperthyroidism    L Subclavian Artery Stenosis    Carotid US 7/21    Obesity (BMI 30-39.9) 2009   BMI 33   Osteoarthritis     "all over; hands" (10/06/2018)   Oxygen deficiency    Oxygen dependent    4L at home as needed (10/06/2018)   Pneumonia    PVD (peripheral vascular disease) ([place])    Sleep apnea 2012   USED CPAP THEN  STARTED USING SPIRIVA , Nov. 2013- last evaluation , changed from mask to aparatus that is just for her nose.. ; reports 05-28-18 "the mask smothers me so i dont use it right now" (  10/06/2018)    Past Surgical History:  Procedure Laterality Date   ANKLE SURGERY  2004   ANTERIOR CERVICAL DECOMP/DISCECTOMY FUSION  11/2007   [place]   lower; another scheduled, opt. reports 4 back- lumbar, 3 cerv. fusions  for later 2009   CARDIAC CATHETERIZATION  11/2009   Patent LIMA to LAD and patent SVG to OM1. Occluded SVG to ramus and diagonal. Left main: 90% ostial stenosis, LCX 60-70% proximal stenosis   CATARACT EXTRACTION W/ INTRAOCULAR LENS  IMPLANT, BILATERAL Bilateral 2006   COLONOSCOPY WITH PROPOFOL N/A 02/17/2017   Procedure: COLONOSCOPY WITH PROPOFOL;  Surgeon: Jerene Bears, MD;  Location[facility] ENDOSCOPY;  Service: Endoscopy;  Laterality: N/A;   CORONARY ANGIOPLASTY WITH STENT PLACEMENT  11/2009   Drug eluting stent to left main artery: 4.0 X 12 mm Ion    CORONARY ARTERY BYPASS GRAFT  2004   "CABG X4"   ESOPHAGOGASTRODUODENOSCOPY N/A 02/14/2017   Procedure: ESOPHAGOGASTRODUODENOSCOPY (EGD);  Surgeon: Doran Stabler, MD;  Location: [facility] ENDOSCOPY;  Service: Endoscopy;  Laterality: N/A;   GIVENS CAPSULE STUDY N/A 02/15/2017   Procedure: GIVENS CAPSULE STUDY;  Surgeon: Doran Stabler, MD;  Location: [place];  Service: Endoscopy;  Laterality: N/A;   GROIN MASS OPEN BIOPSY  2004   IR IVC FILTER PLMT / S&I /IMG GUID/MOD SED  03/14/2018   IR PARACENTESIS  03/10/2018   JOINT REPLACEMENT     LAPAROSCOPIC CHOLECYSTECTOMY     PERIPHERAL VASCULAR INTERVENTION Left 03/05/2018   Procedure: PERIPHERAL VASCULAR INTERVENTION;  Surgeon: Elam Dutch, MD;   Location: [place] CV LAB;  Service: Cardiovascular;  Laterality: Left;  Attempted unsuccess\ful Per Dr. Eden Lathe   PERIPHERAL VASCULAR INTERVENTION  03/08/2018   Procedure: PERIPHERAL VASCULAR INTERVENTION;  Surgeon: Waynetta Sandy, MD;  Location: [place] CV LAB;  Service: Cardiovascular;;  SMA Stent    REPLACEMENT TOTAL KNEE BILATERAL Bilateral 2012   REVERSE SHOULDER ARTHROPLASTY  02/26/2012   Procedure: REVERSE SHOULDER ARTHROPLASTY;  Surgeon: Marin Shutter, MD;  Location: [place];  Service: Orthopedics;  Laterality: Right;  RIGHT SHOULDER REVERSED ARTHROPLASTY   SHOULDER ARTHROSCOPY WITH SUBACROMIAL DECOMPRESSION Left 02/10/2013   Procedure: SHOULDER ARTHROSCOPY WITH SUBACROMIAL DECOMPRESSION DISTAL CLAVICLE RESECTION;  Surgeon: Marin Shutter, MD;  Location: [place];  Service: Orthopedics;  Laterality: Left;  DISTAL CLAVICLE RESECTION   TONSILLECTOMY     TOTAL HIP ARTHROPLASTY  2007   Right   TOTAL HIP ARTHROPLASTY Left 02/07/2020   Procedure: TOTAL HIP ARTHROPLASTY ANTERIOR APPROACH;  Surgeon: Paralee Cancel, MD;  Location: [facility];  Service: Orthopedics;  Laterality: Left;  70 mins   TRANSURETHRAL RESECTION OF BLADDER TUMOR N/A 05/31/2018   Procedure: TRANSURETHRAL RESECTION OF BLADDER TUMOR (TURBT);  Surgeon: Lucas Mallow, MD;  Location: [facility];  Service: Urology;  Laterality: N/A;   VISCERAL ANGIOGRAPHY N/A 03/05/2018   Procedure: VISCERAL ANGIOGRAPHY;  Surgeon: Elam Dutch, MD;  Location: [place] CV [facility];  Service: Cardiovascular;  Laterality: N/A;   VISCERAL ANGIOGRAPHY N/A 03/08/2018   Procedure: VISCERAL ANGIOGRAPHY;  Surgeon: Waynetta Sandy, MD;  Location: [place] CV LAB;  Service: Cardiovascular;  Laterality: N/A;     Social History:  The patient  reports that she quit smoking about 23 years ago. Her smoking use included cigarettes. She has a 45.00 pack-year smoking history. She has never used smokeless tobacco. She reports that she does not drink  alcohol and does  not use drugs.   Family History:  The patient's family history includes COPD in her father; Colitis in her sister; Heart disease in her father and sister; Lung cancer in her father; Ovarian cancer in her mother; Stomach cancer in her sister.    ROS:  Please see the history of present illness. All other systems are reviewed and  Negative to the above problem except as noted.    PHYSICAL EXAM: VS:  BP (!) 160/62 (BP Location: Right Arm, Patient Position: Sitting, Cuff Size: Normal)    Pulse 92    Ht '5\' 7"'$  (1.702 m)    Wt 163 lb 9.6 oz (74.2 kg)    SpO2 94%    BMI 25.62 kg/m   GEN: Well nourished, well developed, in no acute distress  HEENT: normal  Neck: no obvious JVD, no carotid bruits, Cardiac: RRR;  Gr Ii/VI systolic murmur (early peaking ) at base   1-2+ LE edema   (legs wrapped)  Tender  Respiratory:  clear to auscultation bilaterally GI: soft, nontender, nondistended, + BS  No hepatomegaly  MS: Moving all extremities   Skin: warm and dry, no rash Neuro:  Strength and sensation are intact Psych: euthymic mood,    EKG:  EKG is not ordered today.   Lipid Panel    Component Value Date/Time   CHOL 104 02/24/2022 0912   CHOL 98 05/23/2014 0857   TRIG 45 02/24/2022 0912   TRIG 83 05/23/2014 0857   HDL 50 02/24/2022 0912   HDL 35 (L) 05/23/2014 0857   CHOLHDL 2.1 02/24/2022 0912   CHOLHDL 1.7 05/10/2021 2305   VLDL 4 05/10/2021 2305   LDLCALC 43 02/24/2022 0912   LDLCALC 46 05/23/2014 0857      Wt Readings from Last 3 Encounters:  02/24/22 163 lb 9.6 oz (74.2 kg)  07/23/21 171 lb 6.4 oz (77.7 kg)  07/23/21 172 lb 3.2 oz (78.1 kg)      ASSESSMENT AND PLAN:  1  HFpEF   Pt with evid of volume increase on exam    Will repeat labs to see if meds can be adjusted   Discussed salt intake Needs to limit (She admits to biscuits with gravy on the weekends)      2  CAD  CABG 2004  DES to LM in 2010   Myoview in 2018  Low risk No CP  Keep on asa and  statin  3  Hx DVT   not on anticoag due to GI bleeding   s/p IVC filter   4HTN   BP high today  160/62  Add amldipine 2.5 mg   Can take an extra 1/2 if needed to control if BP greater than 160 at hoem     5  HL  Excellent control  in past   Will repeat    6  CKD   Check laabs   7  CV dz   Pt had carotid USN in July  Mod CV dz has been stable      Plan for f/u in 3 months      Current medicines are reviewed at length with the patient today.  The patient does not have concerns regarding medicines.  Signed, Dorris Carnes, MD  02/25/2022 7:47 AM    [address], [place], [place]  [address] Phone: [phone]; Fax: [phone]

## 2022-02-25 LAB — CBC
Hematocrit: 40.7 % (ref 34.0–46.6)
Hemoglobin: 13.2 g/dL (ref 11.1–15.9)
MCH: 28.8 pg (ref 26.6–33.0)
MCHC: 32.4 g/dL (ref 31.5–35.7)
MCV: 89 fL (ref 79–97)
Platelets: 220 10*3/uL (ref 150–450)
RBC: 4.58 x10E6/uL (ref 3.77–5.28)
RDW: 14.3 % (ref 11.7–15.4)
WBC: 9.1 10*3/uL (ref 3.4–10.8)

## 2022-02-25 LAB — BASIC METABOLIC PANEL
BUN/Creatinine Ratio: 15 (ref 12–28)
BUN: 20 mg/dL (ref 8–27)
CO2: 30 mmol/L — ABNORMAL HIGH (ref 20–29)
Calcium: 9.7 mg/dL (ref 8.7–10.3)
Chloride: 100 mmol/L (ref 96–106)
Creatinine, Ser: 1.31 mg/dL — ABNORMAL HIGH (ref 0.57–1.00)
Glucose: 78 mg/dL (ref 70–99)
Potassium: 4.8 mmol/L (ref 3.5–5.2)
Sodium: 141 mmol/L (ref 134–144)
eGFR: 41 mL/min/{1.73_m2} — ABNORMAL LOW (ref >59)

## 2022-02-25 LAB — LIPID PANEL
Chol/HDL Ratio: 2.1 ratio (ref 0.0–4.4)
Cholesterol, Total: 104 mg/dL (ref 100–199)
HDL: 50 mg/dL (ref >39)
LDL Chol Calc (NIH): 43 mg/dL (ref 0–99)
Triglycerides: 45 mg/dL (ref 0–149)
VLDL Cholesterol Cal: 11 mg/dL (ref 5–40)

## 2022-02-25 LAB — TSH: TSH: 6.93 u[IU]/mL — ABNORMAL HIGH (ref 0.450–4.500)

## 2022-02-25 LAB — PRO B NATRIURETIC PEPTIDE: NT-Pro BNP: 1001 pg/mL — ABNORMAL HIGH (ref 0–738)

## 2022-02-27 ENCOUNTER — Telehealth: Payer: Self-pay

## 2022-02-27 DIAGNOSIS — I5032 Chronic diastolic (congestive) heart failure: Secondary | ICD-10-CM

## 2022-02-27 DIAGNOSIS — Z79899 Other long term (current) drug therapy: Secondary | ICD-10-CM

## 2022-02-27 MED ORDER — METOLAZONE 2.5 MG PO TABS: 2.5000 mg | ORAL_TABLET | ORAL | 0 refills | Status: DC

## 2022-02-27 NOTE — Telephone Encounter (Signed)
-----   Message from Fay Records, MD sent at 02/26/2022  9:37 PM EST ----- ?Kidney function is a little lower than 9 months ago     ?FLuid is up some but it has been worse in past  ?Could try zaroxylyn 2.5 mg    Take it 1x per week    Take 30 min before lasix  ? ?F?U BME and BNP in 3 wks    ? ?

## 2022-02-27 NOTE — Telephone Encounter (Signed)
Spoke with patient and discussed lab results. Per Dr. Harrington Challenger, patient to start Zaroxolyn 2.'5mg'$  once a week. Sent to pharmacy of choice. ? ?F/U BMET and BNP scheduled for 03/20/22/ ? ?Patient verbalized understanding. ?

## 2022-03-20 ENCOUNTER — Other Ambulatory Visit: Payer: Medicare HMO

## 2022-03-20 DIAGNOSIS — K551 Chronic vascular disorders of intestine: Secondary | ICD-10-CM | POA: Diagnosis not present

## 2022-03-20 DIAGNOSIS — I700 Atherosclerosis of aorta: Secondary | ICD-10-CM | POA: Diagnosis not present

## 2022-03-20 DIAGNOSIS — I10 Essential (primary) hypertension: Secondary | ICD-10-CM | POA: Diagnosis not present

## 2022-03-20 DIAGNOSIS — I5032 Chronic diastolic (congestive) heart failure: Secondary | ICD-10-CM | POA: Diagnosis not present

## 2022-03-20 DIAGNOSIS — I25118 Atherosclerotic heart disease of native coronary artery with other forms of angina pectoris: Secondary | ICD-10-CM | POA: Diagnosis not present

## 2022-03-20 DIAGNOSIS — Z86718 Personal history of other venous thrombosis and embolism: Secondary | ICD-10-CM | POA: Diagnosis not present

## 2022-03-20 DIAGNOSIS — E782 Mixed hyperlipidemia: Secondary | ICD-10-CM | POA: Diagnosis not present

## 2022-03-20 DIAGNOSIS — N1831 Chronic kidney disease, stage 3a: Secondary | ICD-10-CM | POA: Diagnosis not present

## 2022-03-20 DIAGNOSIS — J449 Chronic obstructive pulmonary disease, unspecified: Secondary | ICD-10-CM | POA: Diagnosis not present

## 2022-03-20 LAB — BASIC METABOLIC PANEL
BUN/Creatinine Ratio: 17 (ref 12–28)
BUN: 23 mg/dL (ref 8–27)
CO2: 28 mmol/L (ref 20–29)
Calcium: 9.5 mg/dL (ref 8.7–10.3)
Chloride: 101 mmol/L (ref 96–106)
Creatinine, Ser: 1.34 mg/dL — ABNORMAL HIGH (ref 0.57–1.00)
Glucose: 93 mg/dL (ref 70–99)
Potassium: 4.7 mmol/L (ref 3.5–5.2)
Sodium: 140 mmol/L (ref 134–144)
eGFR: 40 mL/min/{1.73_m2} — ABNORMAL LOW (ref >59)

## 2022-03-23 ENCOUNTER — Telehealth: Payer: Self-pay

## 2022-03-23 DIAGNOSIS — N179 Acute kidney failure, unspecified: Secondary | ICD-10-CM

## 2022-03-23 DIAGNOSIS — Z79899 Other long term (current) drug therapy: Secondary | ICD-10-CM

## 2022-03-23 DIAGNOSIS — I5032 Chronic diastolic (congestive) heart failure: Secondary | ICD-10-CM

## 2022-03-23 NOTE — Telephone Encounter (Signed)
My Chart message sent to the pt RE; her edema... will follow up with her this week.  ?

## 2022-03-23 NOTE — Telephone Encounter (Signed)
-----   Message from Fay Records, MD sent at 03/20/2022 11:48 PM EDT ----- ?Kidney function is stable    ?Electrolytes are OK ?How is swelling ?   ?

## 2022-03-26 ENCOUNTER — Other Ambulatory Visit: Payer: Self-pay

## 2022-03-26 NOTE — Patient Outreach (Signed)
[facility]) Care Management ? ?03/26/2022 ? ?[name] ?[birth year] ?[id number] ? ? ?Telephone call to patient for disease management follow up.   No answer.  Unable to leave a message.   ? ? ?Plan: If no return call, RN CM will attempt patient again in May.   ? ?[name], RN, MSN ?[facility] Care Management ?Care Management Coordinator ?Direct Line [phone] ?Toll Free: [phone]  ?Fax: [phone] ? ?

## 2022-04-09 NOTE — Telephone Encounter (Signed)
I called the pt to check on her swelling and she says she is still having bilateral lower extremity edema... she says it seems worse when she gets up in the morning and goes up to her mid calf... but it is relieved with elevation and her compression stockings help... she says she can try to cut out more NA in her diet... she says she has not been having any SOB.Marland KitchenMarland Kitchen I will forward to Dr. Harrington Challenger for review... her next OV is 06/05/22.  ?

## 2022-04-10 NOTE — Telephone Encounter (Signed)
No answer on the pts home phone.  ?

## 2022-04-10 NOTE — Telephone Encounter (Signed)
Review of records it was May 2022 she was admitted with LE edema for diuresis   ANkles flat at D/C by review of record ?Needs to watch salt ?Consider referral to renal cilnic to help monitor/adjust meds     ?

## 2022-04-10 NOTE — Telephone Encounter (Signed)
Unable to reach pt... tried her twice and says call unable to go through.  ?

## 2022-04-14 NOTE — Telephone Encounter (Signed)
I spoke with the pt and she says she would like to see a renal Clinic to help treat her lower extremity edema.  ?

## 2022-04-14 NOTE — Addendum Note (Signed)
Addended by: Stephani Police on: 04/14/2022 01:19 PM ? ? Modules accepted: Orders ? ?

## 2022-04-21 ENCOUNTER — Other Ambulatory Visit: Payer: Self-pay

## 2022-04-21 MED ORDER — FUROSEMIDE 80 MG PO TABS: 80.0000 mg | ORAL_TABLET | Freq: Every day | ORAL | 3 refills | Status: DC

## 2022-04-22 ENCOUNTER — Other Ambulatory Visit: Payer: Self-pay

## 2022-04-22 NOTE — Patient Instructions (Signed)
Patient Goals/Self-Care Activities: Heart Failure call office if I gain more than 2 pounds in one day or 5 pounds in one week track weight in diary use salt in moderation watch for swelling in feet, ankles and legs every day weigh myself daily follow rescue plan if symptoms flare-up 

## 2022-04-22 NOTE — Patient Outreach (Signed)
[facility]) Care Management ? ?04/22/2022 ? ?[name] ?04/04/1941 ?[id number] ? ? ?Telephone call to patient for disease management follow up. Patient reports doing good.  Patient continues to manage heart failure.  Current weight 160 lbs. No concerns.   ? ?Care Plan : RN Care Manager Plan of Care  ?Updates made by Jon Billings, RN since 04/22/2022 12:00 AM  ?  ? ?Problem: [place] Disease Management and Care Coordination Needs of Heart Failure   ?Priority: High  ?  ? ?Long-Range Goal: Development of Plan of Care for Management of Heart Failure   ?Start Date: 11/11/2021  ?Expected End Date: 12/20/2022  ?This Visit's Progress: On track  ?Recent Progress: On track  ?Priority: High  ?Note:   ?Current Barriers:  ?Chronic Disease Management support and education needs related to CHF  ? ?RNCM Clinical Goal(s):  ?Patient will verbalize basic understanding of  CHF disease process and self health management plan as evidenced by No acute Exacerbation of Heart Failure ?continue to work with RN Care Manager to address care management and care coordination needs related to  CHF as evidenced by adherence to CM Team Scheduled appointments through collaboration with RN Care manager, provider, and care team.  ? ?Interventions: ?Education and support related to heart failure ?Inter-disciplinary care team collaboration (see longitudinal plan of care) ?Evaluation of current treatment plan related to  self management and patient's adherence to plan as established by provider ? ? ?Heart Failure Interventions:  (Status:  Goal on track:  Yes.) Long Term Goal ?Provided education on low sodium diet ?Discussed importance of daily weight and advised patient to weigh and record daily ?Discussed the importance of keeping all appointments with provider ? ?02/13/22 Patient weight 158 lbs. Encouraged patient to continue great work.  Reiterated heart failure management. ? ?   ?Heart Failure Management Reiterated ?Please weight  daily or as ordered by your doctor ?Report to your doctor weight gain of 2 -3 pounds in a day or 5 pounds in a week ?Limit salt intake ?Monitor for shortness of breath, swelling of feet, ankles or abdomen and weight gain. ?Never use the saltshaker.  ?Read all food labels and avoid canned, processed, and pickled foods.   ?Follow your doctor's recommendations for daily salt intake ? ?04/22/22 Patient reports doing good.  Weight 160 lbs.  Denies swelling or increased shortness of breath.  Reinforced importance of heart failure management.   ? ?Patient Goals/Self-Care Activities: Heart Failure ?call office if I gain more than 2 pounds in one day or 5 pounds in one week ?track weight in diary ?use salt in moderation ?watch for swelling in feet, ankles and legs every day ?weigh myself daily ?follow rescue plan if symptoms flare-up ? ?Follow Up Plan:  Telephone follow up appointment with care management team member scheduled for:  July ?The patient has been provided with contact information for the care management team and has been advised to call with any health related questions or concerns.  ?  ? ?Plan: Follow-up: Patient agrees to Care Plan and Follow-up. ?Follow-up in July.   ? ?[name], RN, MSN ?[facility] Care Management ?Care Management Coordinator ?Direct Line [phone] ?Toll Free: [phone]  ?Fax: [phone] ? ?

## 2022-04-28 DIAGNOSIS — M19041 Primary osteoarthritis, right hand: Secondary | ICD-10-CM | POA: Diagnosis not present

## 2022-04-28 DIAGNOSIS — M2550 Pain in unspecified joint: Secondary | ICD-10-CM | POA: Diagnosis not present

## 2022-05-12 DIAGNOSIS — E039 Hypothyroidism, unspecified: Secondary | ICD-10-CM | POA: Diagnosis not present

## 2022-05-12 DIAGNOSIS — G894 Chronic pain syndrome: Secondary | ICD-10-CM | POA: Diagnosis not present

## 2022-05-12 DIAGNOSIS — N1832 Chronic kidney disease, stage 3b: Secondary | ICD-10-CM | POA: Diagnosis not present

## 2022-05-12 DIAGNOSIS — J449 Chronic obstructive pulmonary disease, unspecified: Secondary | ICD-10-CM | POA: Diagnosis not present

## 2022-05-12 DIAGNOSIS — Z6826 Body mass index (BMI) 26.0-26.9, adult: Secondary | ICD-10-CM | POA: Diagnosis not present

## 2022-05-12 DIAGNOSIS — Z5181 Encounter for therapeutic drug level monitoring: Secondary | ICD-10-CM | POA: Diagnosis not present

## 2022-05-12 DIAGNOSIS — I25810 Atherosclerosis of coronary artery bypass graft(s) without angina pectoris: Secondary | ICD-10-CM | POA: Diagnosis not present

## 2022-05-12 DIAGNOSIS — Z79899 Other long term (current) drug therapy: Secondary | ICD-10-CM | POA: Diagnosis not present

## 2022-05-12 DIAGNOSIS — I5032 Chronic diastolic (congestive) heart failure: Secondary | ICD-10-CM | POA: Diagnosis not present

## 2022-05-12 DIAGNOSIS — E7800 Pure hypercholesterolemia, unspecified: Secondary | ICD-10-CM | POA: Diagnosis not present

## 2022-05-12 DIAGNOSIS — Z0000 Encounter for general adult medical examination without abnormal findings: Secondary | ICD-10-CM | POA: Diagnosis not present

## 2022-05-12 DIAGNOSIS — I739 Peripheral vascular disease, unspecified: Secondary | ICD-10-CM | POA: Diagnosis not present

## 2022-05-12 DIAGNOSIS — M109 Gout, unspecified: Secondary | ICD-10-CM | POA: Diagnosis not present

## 2022-05-12 DIAGNOSIS — I10 Essential (primary) hypertension: Secondary | ICD-10-CM | POA: Diagnosis not present

## 2022-05-13 DIAGNOSIS — M79641 Pain in right hand: Secondary | ICD-10-CM | POA: Diagnosis not present

## 2022-05-13 DIAGNOSIS — M21831 Other specified acquired deformities of right forearm: Secondary | ICD-10-CM | POA: Diagnosis not present

## 2022-05-30 DIAGNOSIS — M25641 Stiffness of right hand, not elsewhere classified: Secondary | ICD-10-CM | POA: Diagnosis not present

## 2022-05-30 DIAGNOSIS — M1811 Unilateral primary osteoarthritis of first carpometacarpal joint, right hand: Secondary | ICD-10-CM | POA: Diagnosis not present

## 2022-05-30 DIAGNOSIS — M19041 Primary osteoarthritis, right hand: Secondary | ICD-10-CM | POA: Diagnosis not present

## 2022-05-30 DIAGNOSIS — M25541 Pain in joints of right hand: Secondary | ICD-10-CM | POA: Diagnosis not present

## 2022-05-30 DIAGNOSIS — M25542 Pain in joints of left hand: Secondary | ICD-10-CM | POA: Diagnosis not present

## 2022-06-03 DIAGNOSIS — M25641 Stiffness of right hand, not elsewhere classified: Secondary | ICD-10-CM | POA: Diagnosis not present

## 2022-06-03 DIAGNOSIS — M25542 Pain in joints of left hand: Secondary | ICD-10-CM | POA: Diagnosis not present

## 2022-06-03 DIAGNOSIS — M25541 Pain in joints of right hand: Secondary | ICD-10-CM | POA: Diagnosis not present

## 2022-06-03 DIAGNOSIS — M19041 Primary osteoarthritis, right hand: Secondary | ICD-10-CM | POA: Diagnosis not present

## 2022-06-04 NOTE — Progress Notes (Signed)
Cardiology Office Note   Date:  06/05/2022   ID:  [name], [place] 17-May-1941, MRN [id number]  PCP:  [name], MD  Cardiologist:   Dorris Carnes, MD   Pt presents for f/u of CAD    History of Present Illness: [name] is a 81 y.o. female with a history of CAD (s/p CABG 2004; DES to LM in 2010; myovue in 2018:  Low risk with distal anterior and apical ischemia), chronic diastolic CHF, CV dz, PAD recurrent DVT/PE, GI bleeding  when on anticoagulation (not on),  COPD, PAF (after CABG), GERD, HL, HTN, renal insufficiency, sleep apnea (not on CPAP)    Over the years she has been admitted for pneumonia, COPD exacerbations and decompensated HFpEF I last saw the pt in clinic in May 2022   Since then she has been seen by [name] and last by [name] in Aug 2022  She was dyspneic at that time      I last saw the pt in clinic in March 2023  At that time she was SOB and with edeam   Legs warpped    She admitted to not eating like should all the time   After checking labs at that visit, I recomm adding Zaroxylyn 1x per week    Repeat labs after were stable  The pt comes in today for follow up   She says she has felt bad for 2 wks    Wants to go to bed  Tired  Sweaty.  Has mild cough.  Small amounts of brownish sputum No diarrhea   Slight dysuria     No abdominal pain     Dizzy  No syncope   Did fall yesterday Got weak Diet:  Appetite not good  Not sleeping well   A chronic problem   Feeling heart racing at times    Current Meds  Medication Sig   albuterol (PROVENTIL) (2.5 MG/3ML) 0.083% nebulizer solution Take 2.5 mg by nebulization every 6 (six) hours as needed for wheezing or shortness of breath.   allopurinol (ZYLOPRIM) 100 MG tablet Take 100 mg by mouth daily with breakfast.   amLODipine (NORVASC) 2.5 MG tablet Take 1/2 tablet as needed for BP greater than 160.   ARIPiprazole (ABILIFY) 2 MG tablet Take 2 mg by mouth daily with breakfast.   aspirin EC 81 MG tablet Take  81 mg by mouth daily with breakfast. Swallow whole.   atorvastatin (LIPITOR) 20 MG tablet Take 20 mg by mouth daily with supper.   cholecalciferol (VITAMIN D3) 25 MCG (1000 UNIT) tablet Take 1,000 Units by mouth daily with supper.   cyclobenzaprine (FLEXERIL) 10 MG tablet Take 10 mg by mouth daily.   dapagliflozin propanediol (FARXIGA) 10 MG TABS tablet Take 1 tablet (10 mg total) by mouth daily before breakfast.   Docusate Sodium (DSS) 100 MG CAPS Take by mouth.   fluticasone (FLONASE) 50 MCG/ACT nasal spray Place into both nostrils.   Fluticasone-Umeclidin-Vilant (TRELEGY ELLIPTA) 200-62.5-25 MCG/INH AEPB Inhale 1 puff into the lungs daily.   furosemide (LASIX) 80 MG tablet Take 1 tablet (80 mg total) by mouth daily.   gabapentin (NEURONTIN) 100 MG capsule Take 100 mg by mouth daily with breakfast.   Ipratropium-Albuterol (COMBIVENT RESPIMAT) 20-100 MCG/ACT AERS respimat Inhale 1 puff into the lungs 3 (three) times daily as needed for wheezing or shortness of breath.   isosorbide mononitrate (IMDUR) 120 MG 24 hr tablet Take by mouth.   levothyroxine (SYNTHROID)  137 MCG tablet Take by mouth.   levothyroxine (SYNTHROID) 50 MCG tablet TAKE 1 TABLET BY MOUTH DAILY AT 6AM.   lisinopril (ZESTRIL) 2.5 MG tablet Take by mouth.   magnesium gluconate (MAGONATE) 500 MG tablet Take 500 mg by mouth daily.   Melatonin 10 MG TABS Take 30 mg by mouth at bedtime.   metolazone (ZAROXOLYN) 2.5 MG tablet Take 1 tablet (2.5 mg total) by mouth once a week. Take 30 minutes before Lasix (furosemide).   nitroGLYCERIN (NITROSTAT) 0.4 MG SL tablet Place 1 tablet (0.4 mg total) under the tongue every 5 (five) minutes as needed for chest pain. For chest pain   Omega-3 Fatty Acids (FISH OIL) 1000 MG CPDR Take 1 capsule by mouth daily.   omeprazole (PRILOSEC) 40 MG capsule TAKE 1 CAPSULE BY MOUTH 2 TIMES DAILY.   OXYGEN Inhale 4 L/min into the lungs as needed (DURING ALL TIMES OF EXERTION).    pilocarpine (PILOCAR) 1 %  ophthalmic solution Place 1 drop into both eyes 2 (two) times daily.   potassium chloride SA (KLOR-CON) 20 MEQ tablet Take 20 mEq by mouth daily with lunch.   pramipexole (MIRAPEX) 0.25 MG tablet Take 0.25 mg by mouth daily with supper.   roflumilast (DALIRESP) 500 MCG TABS tablet Take 500 mcg by mouth daily.   spironolactone (ALDACTONE) 25 MG tablet TAKE 1 TABLET BY MOUTH DAILY   traZODone (DESYREL) 50 MG tablet Take 150 mg by mouth at bedtime.    vitamin C (ASCORBIC ACID) 500 MG tablet Take 500 mg by mouth daily with lunch.   XTAMPZA ER 9 MG C12A Take 9 mg by mouth 2 (two) times daily.   Current Facility-Administered Medications for the 06/05/22 encounter (Office Visit) with Fay Records, MD  Medication   0.9 %  sodium chloride infusion     Allergies:   Ativan [lorazepam], Pitavastatin, Ropinirole, Zofran [ondansetron hcl], Zolpidem tartrate, and Penicillins   Past Medical History:  Diagnosis Date   Anemia    bld. transfusion post lumbar surgery- 2012   Anxiety    Arthralgia    NOS   Blood transfusion 2012; 02/2018   WITH BACK SURGERY; "related to hematoma in stomach" (10/06/2018)   CAD (coronary artery disease)    s/p CABG 2004; s/p DES to LM in 2010;  [place] 10/29/11: EF 50-55%, mild elevated filling pressures, no pulmonary HTN, LM 90% ISR, LAD and CFX occluded, S-RI occluded (old), S-OM3 ok and L-LAD ok, native nondominant RCA 95% -  med rx recommended ; Lexiscan Myoview 7/13 at [facility]: demonstrated "normal LV function, anterior attenuation and localized ischemia, inferior, basilar, mid section"   Carotid artery disease ([place])    Carotid US 1/63:  RICA 8-46; LICA 65-99; R subclavian stenosis - Repeat 1 year. // Carotid US 05/2019: R 1-39; L 40-59; R vertebral with atypical antegrade flow; R subclavian stenosis >> repeat 1 year // Carotid US 7/21: Bilat 40-59; L subclavian stenosis // Carotid US 7/22: Bilat ICA 40-59; R subclavian stenosis   CHF (congestive heart failure) ([place])  01/21/2020   Chronic diastolic heart failure (HCC)    Echo 9/10: EF 35-70%, grade 1 diastolic dysfunction   Chronic lower back pain    COPD (chronic obstructive pulmonary disease) ([place])    Emphysema dxed by Dr. Woody Seller in [place] based on PFTs per pt in 2006; placed on albuterol   Depression    TAKES CELEXA  AND  (OFF- WELBUTRIN)   Diuretic-induced hypokalemia 10/08/2018   DVT of lower  extremity (deep venous thrombosis) (HCC)    recurrent. bilateral (2 episodes)   Dyspnea    home o2 when needed   Dysrhythmia    afib with cabg   Exertional angina (HCC)    Treated with Isosorbide, Ranexa, amlodipine; intolerant to metoprolol   GERD (gastroesophageal reflux disease)    Gout    "on daily RX" (10/06/2018)   HLD (hyperlipidemia)    Hypertension    Hyperthyroidism    L Subclavian Artery Stenosis    Carotid US 7/21    Obesity (BMI 30-39.9) 2009   BMI 33   Osteoarthritis    "all over; hands" (10/06/2018)   Oxygen deficiency    Oxygen dependent    4L at home as needed (10/06/2018)   Pneumonia    PVD (peripheral vascular disease) ([place])    Sleep apnea 2012   USED CPAP THEN  STARTED USING SPIRIVA , Nov. 2013- last evaluation , changed from mask to aparatus that is just for her nose.. ; reports 05-28-18 "the mask smothers me so i dont use it right now" (10/06/2018)    Past Surgical History:  Procedure Laterality Date   ANKLE SURGERY  2004   ANTERIOR CERVICAL DECOMP/DISCECTOMY FUSION  11/2007   [place]   lower; another scheduled, opt. reports 4 back- lumbar, 3 cerv. fusions  for later 2009   CARDIAC CATHETERIZATION  11/2009   Patent LIMA to LAD and patent SVG to OM1. Occluded SVG to ramus and diagonal. Left main: 90% ostial stenosis, LCX 60-70% proximal stenosis   CATARACT EXTRACTION W/ INTRAOCULAR LENS  IMPLANT, BILATERAL Bilateral 2006   COLONOSCOPY WITH PROPOFOL N/A 02/17/2017   Procedure: COLONOSCOPY WITH PROPOFOL;  Surgeon: Jerene Bears, MD;   Location: [facility] ENDOSCOPY;  Service: Endoscopy;  Laterality: N/A;   CORONARY ANGIOPLASTY WITH STENT PLACEMENT  11/2009   Drug eluting stent to left main artery: 4.0 X 12 mm Ion    CORONARY ARTERY BYPASS GRAFT  2004   "CABG X4"   ESOPHAGOGASTRODUODENOSCOPY N/A 02/14/2017   Procedure: ESOPHAGOGASTRODUODENOSCOPY (EGD);  Surgeon: Doran Stabler, MD;  Location: [facility] ENDOSCOPY;  Service: Endoscopy;  Laterality: N/A;   GIVENS CAPSULE STUDY N/A 02/15/2017   Procedure: GIVENS CAPSULE STUDY;  Surgeon: Doran Stabler, MD;  Location: [place];  Service: Endoscopy;  Laterality: N/A;   GROIN MASS OPEN BIOPSY  2004   IR IVC FILTER PLMT / S&I /IMG GUID/MOD SED  03/14/2018   IR PARACENTESIS  03/10/2018   JOINT REPLACEMENT     LAPAROSCOPIC CHOLECYSTECTOMY     PERIPHERAL VASCULAR INTERVENTION Left 03/05/2018   Procedure: PERIPHERAL VASCULAR INTERVENTION;  Surgeon: Elam Dutch, MD;  Location: [place] CV LAB;  Service: Cardiovascular;  Laterality: Left;  Attempted unsuccess\ful Per Dr. Eden Lathe   PERIPHERAL VASCULAR INTERVENTION  03/08/2018   Procedure: PERIPHERAL VASCULAR INTERVENTION;  Surgeon: Waynetta Sandy, MD;  Location: [place] CV LAB;  Service: Cardiovascular;;  SMA Stent    REPLACEMENT TOTAL KNEE BILATERAL Bilateral 2012   REVERSE SHOULDER ARTHROPLASTY  02/26/2012   Procedure: REVERSE SHOULDER ARTHROPLASTY;  Surgeon: Marin Shutter, MD;  Location: [place];  Service: Orthopedics;  Laterality: Right;  RIGHT SHOULDER REVERSED ARTHROPLASTY   SHOULDER ARTHROSCOPY WITH SUBACROMIAL DECOMPRESSION Left 02/10/2013   Procedure: SHOULDER ARTHROSCOPY WITH SUBACROMIAL DECOMPRESSION DISTAL CLAVICLE RESECTION;  Surgeon: Marin Shutter, MD;  Location: [place];  Service: Orthopedics;  Laterality: Left;  DISTAL CLAVICLE RESECTION   TONSILLECTOMY  TOTAL HIP ARTHROPLASTY  2007   Right   TOTAL HIP ARTHROPLASTY Left 02/07/2020   Procedure: TOTAL HIP ARTHROPLASTY ANTERIOR APPROACH;  Surgeon: Paralee Cancel,  MD;  Location: [facility];  Service: Orthopedics;  Laterality: Left;  70 mins   TRANSURETHRAL RESECTION OF BLADDER TUMOR N/A 05/31/2018   Procedure: TRANSURETHRAL RESECTION OF BLADDER TUMOR (TURBT);  Surgeon: Lucas Mallow, MD;  Location: [facility];  Service: Urology;  Laterality: N/A;   VISCERAL ANGIOGRAPHY N/A 03/05/2018   Procedure: VISCERAL ANGIOGRAPHY;  Surgeon: Elam Dutch, MD;  Location: [place] CV [facility];  Service: Cardiovascular;  Laterality: N/A;   VISCERAL ANGIOGRAPHY N/A 03/08/2018   Procedure: VISCERAL ANGIOGRAPHY;  Surgeon: Waynetta Sandy, MD;  Location: [place] [facility];  Service: Cardiovascular;  Laterality: N/A;     Social History:  The patient  reports that she quit smoking about 23 years ago. Her smoking use included cigarettes. She has a 45.00 pack-year smoking history. She has never used smokeless tobacco. She reports that she does not drink alcohol and does not use drugs.   Family History:  The patient's family history includes COPD in her father; Colitis in her sister; Heart disease in her father and sister; Lung cancer in her father; Ovarian cancer in her mother; Stomach cancer in her sister.    ROS:  Please see the history of present illness. All other systems are reviewed and  Negative to the above problem except as noted.    PHYSICAL EXAM: VS:  BP 130/80 (BP Location: Left Arm, Patient Position: Sitting, Cuff Size: Normal)   Pulse (!) 105   Temp 98.5 F (36.9 C)   Ht '5\' 8"'$  (1.727 m)   Wt 155 lb 6.4 oz (70.5 kg)   BMI 23.63 kg/m   GEN: Well nourished, well developed, in no acute distress  HEENT: normal  Neck: no obvious JVD, no carotid bruits, Cardiac: Irreg irreg   No S3   Distant    Gr Ii/VI systolic murmur (early peaking ) at base  Trace edema.  Respiratory:  clear to auscultation bilaterally GI: soft, nontender, nondistended, + BS  No hepatomegaly   Mild diffuse tenderness   MS: Moving all extremities   Skin: warm and dry, no rash Neuro:   Strength and sensation are intact Psych: euthymic mood,    EKG:  EKG is ordered today.  Atrial fibrillation    103 bpm      Lipid Panel    Component Value Date/Time   CHOL 104 02/24/2022 0912   CHOL 98 05/23/2014 0857   TRIG 45 02/24/2022 0912   TRIG 83 05/23/2014 0857   HDL 50 02/24/2022 0912   HDL 35 (L) 05/23/2014 0857   CHOLHDL 2.1 02/24/2022 0912   CHOLHDL 1.7 05/10/2021 2305   VLDL 4 05/10/2021 2305   LDLCALC 43 02/24/2022 0912   LDLCALC 46 05/23/2014 0857      Wt Readings from Last 3 Encounters:  06/05/22 155 lb 6.4 oz (70.5 kg)  02/24/22 163 lb 9.6 oz (74.2 kg)  07/23/21 171 lb 6.4 oz (77.7 kg)      ASSESSMENT AND PLAN:  1  Fatigue   New complaint   Weak   ? If due to afib   ? Other problem (infection possibly, ? Dehydration (BP is OK)     WIth all of this and pt say " I have never felt so bad before" will send to ED for further evaluation / Rx   2 Atrial fibrillation  Pt with one prior spell in post CABG period     Has not been on anticoagulation (GI bleeds with AVM)s    Follow on tele  Check TSH  Check echo   3  Hx HFpEF   When I saw her in March she was markedly volume overloaded     She appeared to tolerate addition of Zaroxylyn   Labs at end of march OK   Will recheck  May be dry since appetite down  Check labs  2  CAD  CABG 2004  DES to LM in 2010   Myoview in 2018  Low risk No CP  Keep on asa and statin  3  Hx DVT   not on anticoag due to GI bleeding   s/p IVC filter   4HTN   BP is OK today on current meds   5  HL  Excellent control    LDL 43  HDL 52  Trig 70     6  CKD   Check laabs in ED   Last Cr in May was 1.48      7  CV dz   Pt had carotid USN in July  Mod CV dz has been stable     8   GI bleeding   Deneis     Hgb 14 in May       Cardiology will see patient while in hospital    Current medicines are reviewed at length with the patient today.  The patient does not have concerns regarding medicines.  Signed, Dorris Carnes, MD  06/05/2022  9:35 AM    [address], [place], [place]  [id number] Phone: [phone]; Fax: [phone]

## 2022-06-05 ENCOUNTER — Other Ambulatory Visit: Payer: Self-pay

## 2022-06-05 ENCOUNTER — Emergency Department (HOSPITAL_COMMUNITY): Payer: Medicare HMO

## 2022-06-05 ENCOUNTER — Ambulatory Visit (INDEPENDENT_AMBULATORY_CARE_PROVIDER_SITE_OTHER): Payer: Medicare HMO | Admitting: Internal Medicine

## 2022-06-05 ENCOUNTER — Encounter (HOSPITAL_COMMUNITY): Payer: Self-pay

## 2022-06-05 ENCOUNTER — Observation Stay (HOSPITAL_COMMUNITY)
Admission: EM | Admit: 2022-06-05 | Discharge: 2022-06-06 | Disposition: A | Discharge status: Home / Self Care | Payer: Medicare HMO | Attending: Internal Medicine | Admitting: Internal Medicine

## 2022-06-05 ENCOUNTER — Encounter: Payer: Self-pay | Admitting: Internal Medicine

## 2022-06-05 VITALS — BP 130/80 | HR 105 | Temp 98.5°F | Ht 68.0 in | Wt 155.4 lb

## 2022-06-05 DIAGNOSIS — Z20822 Contact with and (suspected) exposure to covid-19: Secondary | ICD-10-CM | POA: Insufficient documentation

## 2022-06-05 DIAGNOSIS — Z87891 Personal history of nicotine dependence: Secondary | ICD-10-CM | POA: Diagnosis not present

## 2022-06-05 DIAGNOSIS — R5382 Chronic fatigue, unspecified: Secondary | ICD-10-CM

## 2022-06-05 DIAGNOSIS — J189 Pneumonia, unspecified organism: Secondary | ICD-10-CM | POA: Diagnosis not present

## 2022-06-05 DIAGNOSIS — Z955 Presence of coronary angioplasty implant and graft: Secondary | ICD-10-CM | POA: Diagnosis not present

## 2022-06-05 DIAGNOSIS — I5032 Chronic diastolic (congestive) heart failure: Secondary | ICD-10-CM | POA: Diagnosis not present

## 2022-06-05 DIAGNOSIS — I4891 Unspecified atrial fibrillation: Secondary | ICD-10-CM

## 2022-06-05 DIAGNOSIS — Z79899 Other long term (current) drug therapy: Secondary | ICD-10-CM | POA: Diagnosis not present

## 2022-06-05 DIAGNOSIS — Z96653 Presence of artificial knee joint, bilateral: Secondary | ICD-10-CM | POA: Diagnosis not present

## 2022-06-05 DIAGNOSIS — J449 Chronic obstructive pulmonary disease, unspecified: Secondary | ICD-10-CM | POA: Diagnosis not present

## 2022-06-05 DIAGNOSIS — Z86711 Personal history of pulmonary embolism: Secondary | ICD-10-CM | POA: Insufficient documentation

## 2022-06-05 DIAGNOSIS — I480 Paroxysmal atrial fibrillation: Secondary | ICD-10-CM | POA: Diagnosis not present

## 2022-06-05 DIAGNOSIS — N1831 Chronic kidney disease, stage 3a: Secondary | ICD-10-CM | POA: Diagnosis not present

## 2022-06-05 DIAGNOSIS — N189 Chronic kidney disease, unspecified: Secondary | ICD-10-CM | POA: Diagnosis not present

## 2022-06-05 DIAGNOSIS — I2510 Atherosclerotic heart disease of native coronary artery without angina pectoris: Secondary | ICD-10-CM

## 2022-06-05 DIAGNOSIS — Z86718 Personal history of other venous thrombosis and embolism: Secondary | ICD-10-CM | POA: Insufficient documentation

## 2022-06-05 DIAGNOSIS — Z96611 Presence of right artificial shoulder joint: Secondary | ICD-10-CM | POA: Insufficient documentation

## 2022-06-05 DIAGNOSIS — E039 Hypothyroidism, unspecified: Secondary | ICD-10-CM | POA: Diagnosis not present

## 2022-06-05 DIAGNOSIS — Z96643 Presence of artificial hip joint, bilateral: Secondary | ICD-10-CM | POA: Diagnosis not present

## 2022-06-05 DIAGNOSIS — Z7982 Long term (current) use of aspirin: Secondary | ICD-10-CM | POA: Diagnosis not present

## 2022-06-05 DIAGNOSIS — I130 Hypertensive heart and chronic kidney disease with heart failure and stage 1 through stage 4 chronic kidney disease, or unspecified chronic kidney disease: Secondary | ICD-10-CM | POA: Insufficient documentation

## 2022-06-05 DIAGNOSIS — Z7984 Long term (current) use of oral hypoglycemic drugs: Secondary | ICD-10-CM | POA: Insufficient documentation

## 2022-06-05 DIAGNOSIS — Z951 Presence of aortocoronary bypass graft: Secondary | ICD-10-CM | POA: Diagnosis not present

## 2022-06-05 DIAGNOSIS — R059 Cough, unspecified: Secondary | ICD-10-CM | POA: Diagnosis not present

## 2022-06-05 DIAGNOSIS — R002 Palpitations: Secondary | ICD-10-CM | POA: Diagnosis not present

## 2022-06-05 DIAGNOSIS — R5381 Other malaise: Secondary | ICD-10-CM | POA: Diagnosis present

## 2022-06-05 LAB — CBC WITH DIFFERENTIAL/PLATELET
Abs Immature Granulocytes: 0.3 10*3/uL — ABNORMAL HIGH (ref 0.00–0.07)
Basophils Absolute: 0.1 10*3/uL (ref 0.0–0.1)
Basophils Relative: 1 %
Eosinophils Absolute: 0.1 10*3/uL (ref 0.0–0.5)
Eosinophils Relative: 1 %
HCT: 40 % (ref 36.0–46.0)
Hemoglobin: 12.7 g/dL (ref 12.0–15.0)
Immature Granulocytes: 3 %
Lymphocytes Relative: 12 %
Lymphs Abs: 1.3 10*3/uL (ref 0.7–4.0)
MCH: 28 pg (ref 26.0–34.0)
MCHC: 31.8 g/dL (ref 30.0–36.0)
MCV: 88.1 fL (ref 80.0–100.0)
Monocytes Absolute: 1.2 10*3/uL — ABNORMAL HIGH (ref 0.1–1.0)
Monocytes Relative: 11 %
Neutro Abs: 7.5 10*3/uL (ref 1.7–7.7)
Neutrophils Relative %: 72 %
Platelets: 392 10*3/uL (ref 150–400)
RBC: 4.54 MIL/uL (ref 3.87–5.11)
RDW: 15.1 % (ref 11.5–15.5)
WBC: 10.4 10*3/uL (ref 4.0–10.5)
nRBC: 0 % (ref 0.0–0.2)

## 2022-06-05 LAB — COMPREHENSIVE METABOLIC PANEL
ALT: 17 U/L (ref 0–44)
AST: 16 U/L (ref 15–41)
Albumin: 2.7 g/dL — ABNORMAL LOW (ref 3.5–5.0)
Alkaline Phosphatase: 70 U/L (ref 38–126)
Anion gap: 10 (ref 5–15)
BUN: 18 mg/dL (ref 8–23)
CO2: 30 mmol/L (ref 22–32)
Calcium: 9.1 mg/dL (ref 8.9–10.3)
Chloride: 96 mmol/L — ABNORMAL LOW (ref 98–111)
Creatinine, Ser: 1.24 mg/dL — ABNORMAL HIGH (ref 0.44–1.00)
GFR, Estimated: 44 mL/min — ABNORMAL LOW (ref >60)
Glucose, Bld: 100 mg/dL — ABNORMAL HIGH (ref 70–99)
Potassium: 4.1 mmol/L (ref 3.5–5.1)
Sodium: 136 mmol/L (ref 135–145)
Total Bilirubin: 0.6 mg/dL (ref 0.3–1.2)
Total Protein: 7.5 g/dL (ref 6.5–8.1)

## 2022-06-05 LAB — TROPONIN I (HIGH SENSITIVITY): Troponin I (High Sensitivity): 13 ng/L (ref <18)

## 2022-06-05 LAB — SARS CORONAVIRUS 2 BY RT PCR: SARS Coronavirus 2 by RT PCR: NEGATIVE

## 2022-06-05 LAB — MAGNESIUM: Magnesium: 2.3 mg/dL (ref 1.7–2.4)

## 2022-06-05 LAB — BRAIN NATRIURETIC PEPTIDE: B Natriuretic Peptide: 301.2 pg/mL — ABNORMAL HIGH (ref 0.0–100.0)

## 2022-06-05 LAB — TSH: TSH: 3.825 u[IU]/mL (ref 0.350–4.500)

## 2022-06-05 MED ORDER — GABAPENTIN 100 MG PO CAPS
200.0000 mg | ORAL_CAPSULE | Freq: Every day | ORAL | Status: DC
  Administered 2022-06-05: 200 mg via ORAL
  Filled 2022-06-05: qty 2

## 2022-06-05 MED ORDER — PANTOPRAZOLE SODIUM 40 MG PO TBEC
40.0000 mg | DELAYED_RELEASE_TABLET | Freq: Every day | ORAL | Status: DC
  Administered 2022-06-05 – 2022-06-06 (×2): 40 mg via ORAL
  Filled 2022-06-05 (×2): qty 1

## 2022-06-05 MED ORDER — DEXTROSE 5 % IV SOLN
250.0000 mg | INTRAVENOUS | Status: DC
  Administered 2022-06-05: 250 mg via INTRAVENOUS
  Filled 2022-06-05 (×2): qty 2.5

## 2022-06-05 MED ORDER — ALLOPURINOL 100 MG PO TABS
100.0000 mg | ORAL_TABLET | Freq: Every day | ORAL | Status: DC
  Administered 2022-06-06: 100 mg via ORAL
  Filled 2022-06-05: qty 1

## 2022-06-05 MED ORDER — CITALOPRAM HYDROBROMIDE 20 MG PO TABS
10.0000 mg | ORAL_TABLET | Freq: Every day | ORAL | Status: DC
  Administered 2022-06-05 – 2022-06-06 (×2): 10 mg via ORAL
  Filled 2022-06-05 (×2): qty 1

## 2022-06-05 MED ORDER — ASPIRIN 81 MG PO TBEC
81.0000 mg | DELAYED_RELEASE_TABLET | Freq: Every day | ORAL | Status: DC
  Administered 2022-06-06: 81 mg via ORAL
  Filled 2022-06-05: qty 1

## 2022-06-05 MED ORDER — MELATONIN 5 MG PO TABS
10.0000 mg | ORAL_TABLET | Freq: Every day | ORAL | Status: DC
  Administered 2022-06-05: 10 mg via ORAL
  Filled 2022-06-05: qty 2

## 2022-06-05 MED ORDER — SPIRONOLACTONE 25 MG PO TABS
25.0000 mg | ORAL_TABLET | Freq: Every day | ORAL | Status: DC
  Administered 2022-06-05 – 2022-06-06 (×2): 25 mg via ORAL
  Filled 2022-06-05 (×2): qty 1

## 2022-06-05 MED ORDER — ENOXAPARIN SODIUM 40 MG/0.4ML IJ SOSY
40.0000 mg | PREFILLED_SYRINGE | INTRAMUSCULAR | Status: DC
  Administered 2022-06-05: 40 mg via SUBCUTANEOUS
  Filled 2022-06-05: qty 0.4

## 2022-06-05 MED ORDER — BUDESONIDE 0.25 MG/2ML IN SUSP
0.2500 mg | Freq: Two times a day (BID) | RESPIRATORY_TRACT | Status: DC
Start: 2022-06-05 — End: 2022-06-06
  Administered 2022-06-05 – 2022-06-06 (×2): 0.25 mg via RESPIRATORY_TRACT
  Filled 2022-06-05 (×2): qty 2

## 2022-06-05 MED ORDER — DILTIAZEM HCL 25 MG/5ML IV SOLN
10.0000 mg | Freq: Once | INTRAVENOUS | Status: AC
  Administered 2022-06-05: 10 mg via INTRAVENOUS
  Filled 2022-06-05: qty 5

## 2022-06-05 MED ORDER — AZITHROMYCIN 250 MG PO TABS
250.0000 mg | ORAL_TABLET | Freq: Every day | ORAL | Status: DC
Start: 2022-06-06 — End: 2022-06-06
  Administered 2022-06-06: 250 mg via ORAL
  Filled 2022-06-05: qty 1

## 2022-06-05 MED ORDER — LEVOTHYROXINE SODIUM 50 MCG PO TABS
50.0000 ug | ORAL_TABLET | Freq: Every day | ORAL | Status: DC
  Administered 2022-06-06: 50 ug via ORAL
  Filled 2022-06-05: qty 1

## 2022-06-05 MED ORDER — CYCLOBENZAPRINE HCL 10 MG PO TABS
10.0000 mg | ORAL_TABLET | Freq: Three times a day (TID) | ORAL | Status: DC | PRN
Start: 2022-06-05 — End: 2022-06-06

## 2022-06-05 MED ORDER — DSS 100 MG PO CAPS: 100.0000 mg | ORAL_CAPSULE | Freq: Every day | ORAL | Status: DC | PRN

## 2022-06-05 MED ORDER — ARIPIPRAZOLE 2 MG PO TABS
2.0000 mg | ORAL_TABLET | Freq: Every day | ORAL | Status: DC
  Administered 2022-06-06: 2 mg via ORAL
  Filled 2022-06-05 (×2): qty 1

## 2022-06-05 MED ORDER — ATORVASTATIN CALCIUM 10 MG PO TABS
20.0000 mg | ORAL_TABLET | Freq: Every day | ORAL | Status: DC
  Administered 2022-06-05: 20 mg via ORAL
  Filled 2022-06-05: qty 2

## 2022-06-05 MED ORDER — SODIUM CHLORIDE 0.9 % IV SOLN: 1.0000 g | INTRAVENOUS | Status: DC

## 2022-06-05 MED ORDER — FLUTICASONE FUROATE-VILANTEROL 100-25 MCG/ACT IN AEPB
1.0000 | INHALATION_SPRAY | Freq: Every day | RESPIRATORY_TRACT | Status: DC
  Administered 2022-06-06: 1 via RESPIRATORY_TRACT
  Filled 2022-06-05: qty 28

## 2022-06-05 MED ORDER — SODIUM CHLORIDE 0.9 % IV SOLN
1.0000 g | Freq: Once | INTRAVENOUS | Status: AC
  Administered 2022-06-05: 1 g via INTRAVENOUS
  Filled 2022-06-05: qty 10

## 2022-06-05 MED ORDER — PRAMIPEXOLE DIHYDROCHLORIDE 0.125 MG PO TABS: 0.2500 mg | ORAL_TABLET | Freq: Every day | ORAL | Status: DC

## 2022-06-05 MED ORDER — DAPAGLIFLOZIN PROPANEDIOL 10 MG PO TABS
10.0000 mg | ORAL_TABLET | Freq: Every day | ORAL | Status: DC
  Administered 2022-06-06: 10 mg via ORAL
  Filled 2022-06-05 (×2): qty 1

## 2022-06-05 MED ORDER — TRAZODONE HCL 150 MG PO TABS
150.0000 mg | ORAL_TABLET | Freq: Every day | ORAL | Status: DC
  Administered 2022-06-05: 150 mg via ORAL
  Filled 2022-06-05: qty 1

## 2022-06-05 MED ORDER — PILOCARPINE HCL 1 % OP SOLN
1.0000 [drp] | Freq: Two times a day (BID) | OPHTHALMIC | Status: DC
  Administered 2022-06-05 – 2022-06-06 (×2): 1 [drp] via OPHTHALMIC
  Filled 2022-06-05: qty 15

## 2022-06-05 MED ORDER — DOCUSATE SODIUM 100 MG PO CAPS
100.0000 mg | ORAL_CAPSULE | Freq: Two times a day (BID) | ORAL | Status: DC
  Administered 2022-06-05 – 2022-06-06 (×2): 100 mg via ORAL
  Filled 2022-06-05 (×2): qty 1

## 2022-06-05 MED ORDER — ALBUTEROL SULFATE (2.5 MG/3ML) 0.083% IN NEBU: 2.5000 mg | INHALATION_SOLUTION | Freq: Four times a day (QID) | RESPIRATORY_TRACT | Status: DC | PRN

## 2022-06-05 MED ORDER — GUAIFENESIN ER 600 MG PO TB12
600.0000 mg | ORAL_TABLET | Freq: Two times a day (BID) | ORAL | Status: DC
  Administered 2022-06-05 – 2022-06-06 (×3): 600 mg via ORAL
  Filled 2022-06-05 (×3): qty 1

## 2022-06-05 MED ORDER — OXYCODONE HCL 5 MG PO TABS
2.5000 mg | ORAL_TABLET | ORAL | Status: DC | PRN
  Administered 2022-06-05: 2.5 mg via ORAL
  Filled 2022-06-05: qty 1

## 2022-06-05 NOTE — ED Provider Notes (Signed)
[facility] EMERGENCY DEPARTMENT Provider Note   CSN: [id number] Arrival date & time: 06/05/22  1002     History  Chief Complaint  Patient presents with   Atrial Fibrillation    [name] is a 81 y.o. female.  HPI Patient reports she has been feeling kind of weak and low energy for about 2 weeks.  She was seen today by dr. Harrington Challenger for her symptoms and was referred to the emergency department.  Patient was identified to be in atrial fibrillation.  She reports a history of atrial fibrillation a couple of years ago.  Previously she had been on anticoagulation but it had been discontinued.  Patient reports has had some occasional chest pain.  None today.  She reports its been mild.  She reports she has felt a little bit more short of breath than usual.  She reports mostly she just feels very weak and low energy.  No documented fevers.  She does report last week and she had some coughing.    Home Medications Prior to Admission medications   Medication Sig Start Date End Date Taking? Authorizing Provider  albuterol (PROVENTIL) (2.5 MG/3ML) 0.083% nebulizer solution Take 2.5 mg by nebulization every 6 (six) hours as needed for wheezing or shortness of breath.   Yes [provider]  allopurinol (ZYLOPRIM) 100 MG tablet Take 100 mg by mouth daily with breakfast. 01/30/17  Yes [provider]  amLODipine (NORVASC) 2.5 MG tablet Take 1/2 tablet as needed for BP greater than 160. 02/24/22  Yes Fay Records, MD  ARIPiprazole (ABILIFY) 2 MG tablet Take 2 mg by mouth daily with breakfast. 12/29/19  Yes [provider]  aspirin EC 81 MG tablet Take 81 mg by mouth daily with breakfast. Swallow whole.   Yes [provider]  atorvastatin (LIPITOR) 20 MG tablet Take 20 mg by mouth daily with supper. 09/21/20  Yes [provider]  cholecalciferol (VITAMIN D3) 25 MCG (1000 UNIT) tablet Take 1,000 Units by mouth daily with supper.   Yes  [provider]  cyclobenzaprine (FLEXERIL) 10 MG tablet Take 10 mg by mouth 3 (three) times daily as needed for muscle spasms.   Yes [provider]  dapagliflozin propanediol (FARXIGA) 10 MG TABS tablet Take 1 tablet (10 mg total) by mouth daily before breakfast. 07/23/21  Yes Weaver, Scott T, PA-C  Docusate Sodium (DSS) 100 MG CAPS Take 100 mg by mouth daily as needed (constipation).   Yes [provider]  Fluticasone-Umeclidin-Vilant (TRELEGY ELLIPTA) 200-62.5-25 MCG/INH AEPB Inhale 1 puff into the lungs daily. 07/23/21  Yes Parrett, Tammy S, NP  furosemide (LASIX) 80 MG tablet Take 1 tablet (80 mg total) by mouth daily. Patient taking differently: Take 80 mg by mouth every other day. 04/21/22  Yes Fay Records, MD  gabapentin (NEURONTIN) 100 MG capsule Take 200 mg by mouth at bedtime. 01/09/20  Yes [provider]  Ipratropium-Albuterol (COMBIVENT RESPIMAT) 20-100 MCG/ACT AERS respimat Inhale 1 puff into the lungs 3 (three) times daily as needed for wheezing or shortness of breath.   Yes [provider]  isosorbide mononitrate (IMDUR) 120 MG 24 hr tablet Take 120 mg by mouth daily.   Yes [provider]  levothyroxine (SYNTHROID) 50 MCG tablet TAKE 1 TABLET BY MOUTH DAILY AT 6AM. Patient taking differently: Take 50 mcg by mouth daily before breakfast. 09/04/21  Yes Fay Records, MD  magnesium gluconate (MAGONATE) 500 MG tablet Take 500 mg by mouth  daily.   Yes [provider]  Melatonin 10 MG TABS Take 30 mg by mouth at bedtime.   Yes [provider]  metolazone (ZAROXOLYN) 2.5 MG tablet Take 1 tablet (2.5 mg total) by mouth once a week. Take 30 minutes before Lasix (furosemide). 02/27/22  Yes Fay Records, MD  nitroGLYCERIN (NITROSTAT) 0.4 MG SL tablet Place 1 tablet (0.4 mg total) under the tongue every 5 (five) minutes as needed for chest pain. For chest pain 04/24/16  Yes Fay Records, MD  Omega-3 Fatty Acids (FISH OIL) 1000 MG  CPDR Take 1 capsule by mouth daily.   Yes [provider]  omeprazole (PRILOSEC) 40 MG capsule TAKE 1 CAPSULE BY MOUTH 2 TIMES DAILY. 12/20/20  Yes Esterwood, Amy S, PA-C  OXYGEN Inhale 4 L/min into the lungs as needed (DURING ALL TIMES OF EXERTION).    Yes [provider]  pilocarpine (PILOCAR) 1 % ophthalmic solution Place 1 drop into both eyes 2 (two) times daily.   Yes [provider]  potassium chloride SA (KLOR-CON) 20 MEQ tablet Take 20 mEq by mouth daily with lunch.   Yes [provider]  pramipexole (MIRAPEX) 0.25 MG tablet Take 0.25 mg by mouth daily with supper. 08/22/20  Yes [provider]  spironolactone (ALDACTONE) 25 MG tablet TAKE 1 TABLET BY MOUTH DAILY 11/20/21  Yes Fay Records, MD  traZODone (DESYREL) 50 MG tablet Take 150 mg by mouth at bedtime.  10/07/18  Yes [provider]  vitamin C (ASCORBIC ACID) 500 MG tablet Take 500 mg by mouth daily with lunch.   Yes [provider]  XTAMPZA ER 9 MG C12A Take 9 mg by mouth 2 (two) times daily. 09/25/20  Yes [provider]  lisinopril (ZESTRIL) 2.5 MG tablet Take by mouth. 05/21/22   [provider]  roflumilast (DALIRESP) 500 MCG TABS tablet Take 500 mcg by mouth daily. 05/21/22   [provider]      Allergies    Ativan [lorazepam], Pitavastatin, Ropinirole, Zofran [ondansetron hcl], Zolpidem tartrate, and Penicillins    Review of Systems   Review of Systems 10 systems reviewed and negative except as per HPI Physical Exam Updated Vital Signs BP 126/70   Pulse 93   Temp 98.3 F (36.8 C) (Oral)   Resp 20   Ht '5\' 8"'$  (1.727 m)   Wt 70.3 kg   SpO2 96%   BMI 23.57 kg/m  Physical Exam Constitutional:      Appearance: Normal appearance.  HENT:     Head: Normocephalic and atraumatic.     Mouth/Throat:     Pharynx: Oropharynx is clear.  Eyes:     Extraocular Movements: Extraocular movements intact.  Cardiovascular:     Rate and  Rhythm: Tachycardia present. Rhythm irregular.  Pulmonary:     Effort: Pulmonary effort is normal.     Comments: Mild basilar crackle. Abdominal:     General: There is no distension.     Palpations: Abdomen is soft.     Tenderness: There is no abdominal tenderness. There is no guarding.  Musculoskeletal:        General: No swelling or tenderness. Normal range of motion.     Right lower leg: No edema.     Left lower leg: No edema.  Skin:    General: Skin is warm and dry.  Neurological:     General: No focal deficit present.     Mental Status: She is alert and oriented  to person, place, and time.     Coordination: Coordination normal.  Psychiatric:        Mood and Affect: Mood normal.     ED Results / Procedures / Treatments   Labs (all labs ordered are listed, but only abnormal results are displayed) Labs Reviewed  COMPREHENSIVE METABOLIC PANEL - Abnormal; Notable for the following components:      Result Value   Chloride 96 (*)    Glucose, Bld 100 (*)    Creatinine, Ser 1.24 (*)    Albumin 2.7 (*)    GFR, Estimated 44 (*)    All other components within normal limits  CBC WITH DIFFERENTIAL/PLATELET - Abnormal; Notable for the following components:   Monocytes Absolute 1.2 (*)    Abs Immature Granulocytes 0.30 (*)    All other components within normal limits  BRAIN NATRIURETIC PEPTIDE - Abnormal; Notable for the following components:   B Natriuretic Peptide 301.2 (*)    All other components within normal limits  SARS CORONAVIRUS 2 BY RT PCR  TSH  MAGNESIUM  URINALYSIS, ROUTINE W REFLEX MICROSCOPIC  TROPONIN I (HIGH SENSITIVITY)  TROPONIN I (HIGH SENSITIVITY)    EKG EKG Interpretation  Date/Time:  Thursday June 05 2022 10:06:27 EDT Ventricular Rate:  105 PR Interval:    QRS Duration: 108 QT Interval:  352 QTC Calculation: 465 R Axis:   -43 Text Interpretation: Atrial fibrillation with rapid ventricular response with premature ventricular or aberrantly  conducted complexes Left axis deviation Incomplete left bundle branch block Left ventricular hypertrophy ( R in aVL , Cornell product , Romhilt-Estes ) Abnormal ECG When compared with ECG of 11-May-2021 02:34, PREVIOUS ECG IS PRESENT agree. Afib is new compared to previous. Unchanged QRS and T wave morphology. no acute ischemic changes Confirmed by Charlesetta Shanks [phone]) on 06/05/2022 10:52:58 AM  Radiology DG Chest 2 View  Result Date: 06/05/2022 CLINICAL DATA:  Cough.  Atrial fibrillation. EXAM: CHEST - 2 VIEW COMPARISON:  Chest radiographs and CTA 05/10/2021 FINDINGS: Sequelae of CABG are again identified. The cardiac silhouette is borderline enlarged. Aortic atherosclerosis and calcified bilateral breast implants are noted. There is a new moderately large region of airspace consolidation in the right lower lobe. Background hyperinflation, chronic interstitial coarsening, and biapical pleuroparenchymal scarring are noted. Prior cervical and lumbar spine fusion and right shoulder arthroplasty are noted. IMPRESSION: Right lower lobe pneumonia. Followup PA and lateral chest X-ray is recommended in 3-4 weeks following trial of antibiotic therapy to ensure resolution and exclude underlying malignancy. Electronically Signed   By: Logan Bores M.D.   On: 06/05/2022 10:30    Procedures Procedures   CRITICAL CARE Performed by: Charlesetta Shanks   Total critical care time: 30 minutes  Critical care time was exclusive of separately billable procedures and treating other patients.  Critical care was necessary to treat or prevent imminent or life-threatening deterioration.  Critical care was time spent personally by me on the following activities: development of treatment plan with patient and/or surrogate as well as nursing, discussions with consultants, evaluation of patient's response to treatment, examination of patient, obtaining history from patient or surrogate, ordering and performing treatments and  interventions, ordering and review of laboratory studies, ordering and review of radiographic studies, pulse oximetry and re-evaluation of patient's condition.  Medications Ordered in ED Medications  cefTRIAXone (ROCEPHIN) 1 g in sodium chloride 0.9 % 100 mL IVPB (has no administration in time range)  diltiazem (CARDIZEM) injection 10 mg (10 mg Intravenous Given 06/05/22 1132)  ED Course/ Medical Decision Making/ A&P                           Medical Decision Making Amount and/or Complexity of Data Reviewed Labs: ordered.  Risk Prescription drug management. Decision regarding hospitalization.   Patient does have prior history of paroxysmal atrial fibrillation.  She has been symptomatic for about 2 weeks now.  Patient is alert.  She has no respiratory distress.  Blood pressures are stable.  Heart rate is in the low 100s.  We will proceed with diagnostic evaluation for generalized symptoms with fatigue loss of energy weakness.  EKG interpreted by myself consistent with atrial fibrillation with rapid ventricular response no ischemic changes or changes in QRS morphology compared to prior tracings.  For patient's atrial fibrillation will administer 10 mg IV of Cardizem.  She is not showing signs of being unstable.  Blood pressures are stable.  She is not hypoxic or exhibiting respiratory distress.   Chest x-ray interpreted by radiology and visually reviewed by myself as well shows a fairly dense infiltrate in the right middle or lower lobe.  Radiology interpretation suggest consistent with pneumonia.  At this time will start Rocephin for community-acquired pneumonia.  Patient is not requiring any supplemental oxygen.  However with significant comorbid conditions of atrial fibrillation with rapid ventricular response and advanced age, will admit for treatment for pneumonia.    Consult: Internal medicine teaching service for admission.        Final Clinical Impression(s) / ED  Diagnoses Final diagnoses:  Atrial fibrillation with RVR ([place])  Community acquired pneumonia of right middle lobe of lung    Rx / DC Orders ED Discharge Orders     None         Charlesetta Shanks, MD 06/05/22 1319

## 2022-06-05 NOTE — ED Triage Notes (Addendum)
Patient bib ems from PCP after having fatigue x 2-3 weeks and mild cough. PCP identified new onset afib 80-120.  Denies chest pain or shortness of breath. Alert and oriented x 4  But medical records report she had afib after her cabg.  Patient has hx of CAD, CHF HTN

## 2022-06-05 NOTE — ED Notes (Signed)
Report called to charge nurse- Jordan Hawks, [place].

## 2022-06-05 NOTE — Consult Note (Addendum)
Cardiology Consultation:   Patient ID: [name] MRN: [id number]; DOB: December 12, 1941  Admit date: 06/05/2022 Date of Consult: 06/05/2022  PCP:  [name], MD   [facility] Providers Cardiologist:  Dorris Carnes, MD  Cardiology APP:  Liliane Shi, PA-C       Patient Profile:   [name] is a 81 y.o. female with a hx of  CAD who is being seen 06/05/2022 for the evaluation of afib at the request of Dr Dorothyann Gibbs.  History of Present Illness:   [name] is a 81 y.o. female with a history of CAD (s/p CABG 2004; DES to LM in 2010; myovue in 2018:  Low risk with distal anterior and apical ischemia), chronic diastolic CHF, CV dz, PAD recurrent DVT/PE, GI bleeding  when on anticoagulation (not on),  COPD, PAF (once, after CABG), GERD, HL, HTN, renal insufficiency, sleep apnea (not on CPAP)    Over the years she has been admitted for pneumonia, COPD exacerbations and decompensated HFpEF   Before today I saw the pt in clinic in March 2023  At that time she was SOB and with LE edema  Her legs were wrapped  She admitted to not eating like should all the time   After checking labs at that visit, I recomm adding Zaroxylyn 1x per week    Repeat labs after were stable   The pt came in to clinic today for follow up   She says she has felt bad for 2 wks    Wants to go to bed  Tired  Sweaty.  Has mild cough.  Small amounts of brownish sputum No diarrhea   Slight dysuria     No abdominal pain     Dizzy  No syncope   Did fall yesterday Got weak Diet:  Appetite not good  Not sleeping well   A chronic problem   Feeling heart racing at times    Past Medical History:  Diagnosis Date   Anemia    bld. transfusion post lumbar surgery- 2012   Anxiety    Arthralgia    NOS   Blood transfusion 2012; 02/2018   WITH BACK SURGERY; "related to hematoma in stomach" (10/06/2018)   CAD (coronary artery disease)    s/p CABG 2004; s/p DES to LM in 2010;  [place] 10/29/11: EF 50-55%, mild  elevated filling pressures, no pulmonary HTN, LM 90% ISR, LAD and CFX occluded, S-RI occluded (old), S-OM3 ok and L-LAD ok, native nondominant RCA 95% -  med rx recommended ; Lexiscan Myoview 7/13 at [facility]: demonstrated "normal LV function, anterior attenuation and localized ischemia, inferior, basilar, mid section"   Carotid artery disease ([place])    Carotid US 4/09:  RICA 8-11; LICA 91-47; R subclavian stenosis - Repeat 1 year. // Carotid US 05/2019: R 1-39; L 40-59; R vertebral with atypical antegrade flow; R subclavian stenosis >> repeat 1 year // Carotid US 7/21: Bilat 40-59; L subclavian stenosis // Carotid US 7/22: Bilat ICA 40-59; R subclavian stenosis   CHF (congestive heart failure) ([place]) 01/21/2020   Chronic diastolic heart failure (HCC)    Echo 9/10: EF 82-95%, grade 1 diastolic dysfunction   Chronic lower back pain    COPD (chronic obstructive pulmonary disease) ([place])    Emphysema dxed by Dr. Woody Seller in [place] based on PFTs per pt in 2006; placed on albuterol   Depression    TAKES CELEXA  AND  (OFF- WELBUTRIN)   Diuretic-induced hypokalemia 10/08/2018  DVT of lower extremity (deep venous thrombosis) (HCC)    recurrent. bilateral (2 episodes)   Dyspnea    home o2 when needed   Dysrhythmia    afib with cabg   Exertional angina (HCC)    Treated with Isosorbide, Ranexa, amlodipine; intolerant to metoprolol   GERD (gastroesophageal reflux disease)    Gout    "on daily RX" (10/06/2018)   HLD (hyperlipidemia)    Hypertension    Hyperthyroidism    L Subclavian Artery Stenosis    Carotid US 7/21    Obesity (BMI 30-39.9) 2009   BMI 33   Osteoarthritis    "all over; hands" (10/06/2018)   Oxygen deficiency    Oxygen dependent    4L at home as needed (10/06/2018)   Pneumonia    PVD (peripheral vascular disease) ([place])    Sleep apnea 2012   USED CPAP THEN  STARTED USING SPIRIVA , Nov. 2013- last evaluation , changed from mask to aparatus that is just for her nose.. ; reports  05-28-18 "the mask smothers me so i dont use it right now" (10/06/2018)    Past Surgical History:  Procedure Laterality Date   ANKLE SURGERY  2004   ANTERIOR CERVICAL DECOMP/DISCECTOMY FUSION  11/2007   [place]   lower; another scheduled, opt. reports 4 back- lumbar, 3 cerv. fusions  for later 2009   CARDIAC CATHETERIZATION  11/2009   Patent LIMA to LAD and patent SVG to OM1. Occluded SVG to ramus and diagonal. Left main: 90% ostial stenosis, LCX 60-70% proximal stenosis   CATARACT EXTRACTION W/ INTRAOCULAR LENS  IMPLANT, BILATERAL Bilateral 2006   COLONOSCOPY WITH PROPOFOL N/A 02/17/2017   Procedure: COLONOSCOPY WITH PROPOFOL;  Surgeon: Jerene Bears, MD;  Location: [facility] ENDOSCOPY;  Service: Endoscopy;  Laterality: N/A;   CORONARY ANGIOPLASTY WITH STENT PLACEMENT  11/2009   Drug eluting stent to left main artery: 4.0 X 12 mm Ion    CORONARY ARTERY BYPASS GRAFT  2004   "CABG X4"   ESOPHAGOGASTRODUODENOSCOPY N/A 02/14/2017   Procedure: ESOPHAGOGASTRODUODENOSCOPY (EGD);  Surgeon: Doran Stabler, MD;  Location: [facility] ENDOSCOPY;  Service: Endoscopy;  Laterality: N/A;   GIVENS CAPSULE STUDY N/A 02/15/2017   Procedure: GIVENS CAPSULE STUDY;  Surgeon: Doran Stabler, MD;  Location: [place];  Service: Endoscopy;  Laterality: N/A;   GROIN MASS OPEN BIOPSY  2004   IR IVC FILTER PLMT / S&I /IMG GUID/MOD SED  03/14/2018   IR PARACENTESIS  03/10/2018   JOINT REPLACEMENT     LAPAROSCOPIC CHOLECYSTECTOMY     PERIPHERAL VASCULAR INTERVENTION Left 03/05/2018   Procedure: PERIPHERAL VASCULAR INTERVENTION;  Surgeon: Elam Dutch, MD;  Location: [place] CV LAB;  Service: Cardiovascular;  Laterality: Left;  Attempted unsuccess\ful Per Dr. Eden Lathe   PERIPHERAL VASCULAR INTERVENTION  03/08/2018   Procedure: PERIPHERAL VASCULAR INTERVENTION;  Surgeon: Waynetta Sandy, MD;  Location: [place] [facility];  Service: Cardiovascular;;  SMA Stent    REPLACEMENT  TOTAL KNEE BILATERAL Bilateral 2012   REVERSE SHOULDER ARTHROPLASTY  02/26/2012   Procedure: REVERSE SHOULDER ARTHROPLASTY;  Surgeon: Marin Shutter, MD;  Location: [place];  Service: Orthopedics;  Laterality: Right;  RIGHT SHOULDER REVERSED ARTHROPLASTY   SHOULDER ARTHROSCOPY WITH SUBACROMIAL DECOMPRESSION Left 02/10/2013   Procedure: SHOULDER ARTHROSCOPY WITH SUBACROMIAL DECOMPRESSION DISTAL CLAVICLE RESECTION;  Surgeon: Marin Shutter, MD;  Location: [place];  Service: Orthopedics;  Laterality: Left;  DISTAL CLAVICLE RESECTION  TONSILLECTOMY     TOTAL HIP ARTHROPLASTY  2007   Right   TOTAL HIP ARTHROPLASTY Left 02/07/2020   Procedure: TOTAL HIP ARTHROPLASTY ANTERIOR APPROACH;  Surgeon: Paralee Cancel, MD;  Location: [facility];  Service: Orthopedics;  Laterality: Left;  70 mins   TRANSURETHRAL RESECTION OF BLADDER TUMOR N/A 05/31/2018   Procedure: TRANSURETHRAL RESECTION OF BLADDER TUMOR (TURBT);  Surgeon: Lucas Mallow, MD;  Location: [facility];  Service: Urology;  Laterality: N/A;   VISCERAL ANGIOGRAPHY N/A 03/05/2018   Procedure: VISCERAL ANGIOGRAPHY;  Surgeon: Elam Dutch, MD;  Location: [place] [facility];  Service: Cardiovascular;  Laterality: N/A;   VISCERAL ANGIOGRAPHY N/A 03/08/2018   Procedure: VISCERAL ANGIOGRAPHY;  Surgeon: Waynetta Sandy, MD;  Location: [place] [facility];  Service: Cardiovascular;  Laterality: N/A;       Inpatient Medications: Scheduled Meds:  Continuous Infusions:  sodium chloride     cefTRIAXone (ROCEPHIN)  IV     PRN Meds: sodium chloride  Allergies:    Allergies  Allergen Reactions   Ativan [Lorazepam] Other (See Comments)    Confusion    Pitavastatin Itching    Reaction to Livalo   Ropinirole Nausea Only and Other (See Comments)    Makes legs jump, also   Zofran [Ondansetron Hcl] Nausea Only   Zolpidem Tartrate Anxiety and Other (See Comments)    CONFUSION    Penicillins Rash and Other (See Comments)    Tolerated cephalosporins in  past Has patient had a PCN reaction causing immediate rash, facial/tongue/throat swelling, SOB or lightheadedness with hypotension: Yes Has patient had a PCN reaction causing severe rash involving mucus membranes or skin necrosis: No Has patient had a PCN reaction that required hospitalization: No Has patient had a PCN reaction occurring within the last 10 years: No If all of the above answers are "NO", then may proceed with Cephalosporin use.     Social History:   Social History   Socioeconomic History   Marital status: Widowed    Spouse name: Not on file   Number of children: 3   Years of education: Not on file   Highest education level: Not on file  Occupational History   Occupation: retired    Comment: Electronics engineer: RETIRED  Tobacco Use   Smoking status: Former    Packs/day: 1.00    Years: 45.00    Total pack years: 45.00    Types: Cigarettes    Quit date: 12/22/1998    Years since quitting: 23.4   Smokeless tobacco: Never  Vaping Use   Vaping Use: Never used  Substance and Sexual Activity   Alcohol use: Never    Alcohol/week: 0.0 standard drinks of alcohol   Drug use: Never   Sexual activity: Not Currently  Other Topics Concern   Not on file  Social History Narrative      3 grown children.   Social Determinants of Health   Financial Resource Strain: Not on file  Food Insecurity: No Food Insecurity (01/10/2021)   Hunger Vital Sign    Worried About Running Out of Food in the Last Year: Never true    Ran Out of Food in the Last Year: Never true  Transportation Needs: No Transportation Needs (04/22/2022)   PRAPARE - Hydrologist (Medical): No    Lack of Transportation (Non-Medical): No  Physical Activity: Inactive (04/22/2022)   Exercise Vital Sign    Days of Exercise per Week:  0 days    Minutes of Exercise per Session: 0 min  Stress: Not on file  Social Connections: Not on file  Intimate Partner Violence: Not on file     Family History:    Family History  Problem Relation Age of Onset   Ovarian cancer Mother    Lung cancer Father    COPD Father    Heart disease Father    Heart disease Sister    Stomach cancer Sister    Colitis Sister      ROS:  Please see the history of present illness.   All other ROS reviewed and negative.     Physical Exam/Data:   Vitals:   06/05/22 1034 06/05/22 1115 06/05/22 1200 06/05/22 1215  BP: 123/70 (!) 142/55 (!) 123/50 126/70  Pulse: (!) 102 94 82 93  Resp: 16 16 (!) 24 20  Temp: 98.3 F (36.8 C)     TempSrc: Oral     SpO2: 91% 95% 92% 96%  Weight:      Height:       No intake or output data in the 24 hours ending 06/05/22 1323    06/05/2022   10:05 AM 06/05/2022    8:34 AM 02/24/2022    8:40 AM  Last 3 Weights  Weight (lbs) 155 lb 155 lb 6.4 oz 163 lb 9.6 oz  Weight (kg) 70.308 kg 70.489 kg 74.208 kg     Body mass index is 23.57 kg/m.  General:  Well nourished, well developed, in no acute distress   HEENT: normal Neck: no JVD Vascular: No carotid bruits; Distal pulses 2+ bilaterally Cardiac:  Irreg irreg   No S3  Gr I-II/VI systolic murmur base  Lungs:  Rel clear    SOme decreased airflow  Abd: soft,  Mild diffuse tenderness Ext: Tr to 1+ edema Musculoskeletal:  No deformities, BUE and BLE strength normal and equal Skin: warm and dry  Neuro:  CNs 2-12 intact, no focal abnormalities noted Psych:  Normal affect   EKG:  The EKG was personally reviewed and demonstratesIn office  Afib   103 bpm   Telemetry:  Telemetry was personally reviewed and demonstrates:  Not available at present   Relevant CV Studies: Pending   Laboratory Data:  High Sensitivity Troponin:   Recent Labs  Lab 06/05/22 1013  TROPONINIHS 13     Chemistry Recent Labs  Lab 06/05/22 1013  NA 136  K 4.1  CL 96*  CO2 30  GLUCOSE 100*  BUN 18  CREATININE 1.24*  CALCIUM 9.1  MG 2.3  GFRNONAA 44*  ANIONGAP 10    Recent Labs  Lab 06/05/22 1013  PROT 7.5   ALBUMIN 2.7*  AST 16  ALT 17  ALKPHOS 70  BILITOT 0.6   Lipids No results for input(s): "CHOL", "TRIG", "HDL", "LABVLDL", "LDLCALC", "CHOLHDL" in the last 168 hours.  Hematology Recent Labs  Lab 06/05/22 1013  WBC 10.4  RBC 4.54  HGB 12.7  HCT 40.0  MCV 88.1  MCH 28.0  MCHC 31.8  RDW 15.1  PLT 392   Thyroid  Recent Labs  Lab 06/05/22 1013  TSH 3.825    BNP Recent Labs  Lab 06/05/22 1013  BNP 301.2*    DDimer No results for input(s): "DDIMER" in the last 168 hours.   Radiology/Studies:  DG Chest 2 View  Result Date: 06/05/2022 CLINICAL DATA:  Cough.  Atrial fibrillation. EXAM: CHEST - 2 VIEW COMPARISON:  Chest radiographs and CTA 05/10/2021 FINDINGS: Sequelae  of CABG are again identified. The cardiac silhouette is borderline enlarged. Aortic atherosclerosis and calcified bilateral breast implants are noted. There is a new moderately large region of airspace consolidation in the right lower lobe. Background hyperinflation, chronic interstitial coarsening, and biapical pleuroparenchymal scarring are noted. Prior cervical and lumbar spine fusion and right shoulder arthroplasty are noted. IMPRESSION: Right lower lobe pneumonia. Followup PA and lateral chest X-ray is recommended in 3-4 weeks following trial of antibiotic therapy to ensure resolution and exclude underlying malignancy. Electronically Signed   By: Logan Bores M.D.   On: 06/05/2022 10:30     Assessment and Plan:    1 Fatigue   New complaint  Pt says " I have never felt this bad before"   CXR with RML pneumonia    Follow on Rx      2 Atrial fibrillation   Pt with one prior spell, immed post CABG   Current spell may resolve as Rx pneumonia but may not  Pt has not been on anticoagulation (GI bleeds with AVM)s   Overall rate not bad   Recomm:  Follow on tele  Check TSH  Check echo    Will need a decision on long term course    High risk for NOAC     3  Pulmonary   Pt with pneumonia on CXR  ABX started    4    Hx HFpEF   When I saw her in March she was markedly volume overloaded     She appeared to tolerate addition of Zaroxylyn   Labs at end of march OK   Cr now 1.24     Follow for now   5  CAD  CABG 2004  DES to LM in 2010   Myoview in 2018  Low risk No CP  Keep on asa and statin   6  Hx DVT   not on anticoag due to GI bleeding   s/p IVC filter    7HTN   BP is OK today on current meds    8 HL  Excellent control    LDL 43  HDL 52  Trig 70      0  CKD   Check laabs in ED   Last Cr in May was 1.48    Now 1.24   10  CV dz   Pt had carotid USN in July  Mod CV dz has been stable      11   GI bleeding   [place]     Hgb 14 in May          For questions or updates, please contact [facility] Please consult [email or web address] for contact info under    Signed, Dorris Carnes, MD  06/05/2022 1:23 PM

## 2022-06-05 NOTE — H&P (Signed)
NAME:  [name], MRN:  [id number], DOB:  1941/10/14, LOS: 0 ADMISSION DATE:  06/05/2022, Primary: [name], MD  CHIEF COMPLAINT: General malaise  Medical Service: Internal Medicine Teaching Service         Attending Physician: Dr. Velna Ochs, MD    First Contact: Dr. Howie Ill Pager: [phone]  Second Contact: Dr. Collene Gobble Pager: [phone]       After Hours (After 5p/  First Contact Pager: [phone]  weekends / holidays): Second Contact Pager: [phone]    History of present illness   [name] is an 81 year old female with COPD with chronic respiratory failure (on 4 L of oxygen with exertion) who was sent over from her cardiologist office for 2-week history of cough, shortness of breath, and fatigue.  Cough is productive of yellow mucus.  Shortness of breath is not severe but occurs with exertion.  She has been feeling tired a lot.  Denies orthopnea or chest pain but has been experiencing palpitations.  Denies fever or chills.  Lives independently, resides alone.  No recent sick contacts.  Received the COVID and pneumonia vaccines Quit smoking in 2001.  Past Medical History  She,  has a past medical history of Anemia, Anxiety, Arthralgia, Blood transfusion (2012; 02/2018), CAD (coronary artery disease), Carotid artery disease ([place]), CHF (congestive heart failure) ([place]) (01/21/2020), Chronic diastolic heart failure ([place]), Chronic lower back pain, COPD (chronic obstructive pulmonary disease) ([place]), Depression, Diuretic-induced hypokalemia (10/08/2018), DVT of lower extremity (deep venous thrombosis) ([place]), Dyspnea, Dysrhythmia, Exertional angina ([place]), GERD (gastroesophageal reflux disease), Gout, HLD (hyperlipidemia), Hypertension, Hyperthyroidism, L Subclavian Artery Stenosis, Obesity (BMI 30-39.9) (2009), Osteoarthritis, Oxygen deficiency, Oxygen dependent, Pneumonia, PVD (peripheral vascular disease) ([place]), and Sleep apnea (2012).   Home Medications     Prior to  Admission medications   Medication Sig Start Date End Date Taking? Authorizing Provider  albuterol (PROVENTIL) (2.5 MG/3ML) 0.083% nebulizer solution Take 2.5 mg by nebulization every 6 (six) hours as needed for wheezing or shortness of breath.   Yes [provider]  allopurinol (ZYLOPRIM) 100 MG tablet Take 100 mg by mouth daily with breakfast. 01/30/17  Yes [provider]  amLODipine (NORVASC) 2.5 MG tablet Take 1/2 tablet as needed for BP greater than 160. 02/24/22  Yes Fay Records, MD  ARIPiprazole (ABILIFY) 2 MG tablet Take 2 mg by mouth daily with breakfast. 12/29/19  Yes [provider]  aspirin EC 81 MG tablet Take 81 mg by mouth daily with breakfast. Swallow whole.   Yes [provider]  atorvastatin (LIPITOR) 20 MG tablet Take 20 mg by mouth daily with supper. 09/21/20  Yes [provider]  cholecalciferol (VITAMIN D3) 25 MCG (1000 UNIT) tablet Take 1,000 Units by mouth daily with supper.   Yes [provider]  citalopram (CELEXA) 10 MG tablet Take 10 mg by mouth daily.   Yes [provider]  cyclobenzaprine (FLEXERIL) 10 MG tablet Take 10 mg by mouth 3 (three) times daily as needed for muscle spasms.   Yes [provider]  dapagliflozin propanediol (FARXIGA) 10 MG TABS tablet Take 1 tablet (10 mg total) by mouth daily before breakfast. 07/23/21  Yes Weaver, Scott T, PA-C  Docusate Sodium (DSS) 100 MG CAPS Take 100 mg by mouth daily as needed (constipation).   Yes [provider]  Fluticasone-Umeclidin-Vilant (TRELEGY ELLIPTA) 200-62.5-25 MCG/INH AEPB Inhale 1 puff into the lungs daily. 07/23/21  Yes Parrett, Tammy S, NP  furosemide (LASIX) 80 MG tablet Take 1  tablet (80 mg total) by mouth daily. Patient taking differently: Take 80 mg by mouth every other day. 04/21/22  Yes Fay Records, MD  gabapentin (NEURONTIN) 100 MG capsule Take 200 mg by mouth at bedtime. 01/09/20  Yes [provider]   Ipratropium-Albuterol (COMBIVENT RESPIMAT) 20-100 MCG/ACT AERS respimat Inhale 1 puff into the lungs 3 (three) times daily as needed for wheezing or shortness of breath.   Yes [provider]  levothyroxine (SYNTHROID) 50 MCG tablet TAKE 1 TABLET BY MOUTH DAILY AT 6AM. Patient taking differently: Take 50 mcg by mouth daily before breakfast. 09/04/21  Yes Fay Records, MD  magnesium gluconate (MAGONATE) 500 MG tablet Take 500 mg by mouth daily.   Yes [provider]  Melatonin 10 MG TABS Take 30 mg by mouth at bedtime.   Yes [provider]  metolazone (ZAROXOLYN) 2.5 MG tablet Take 1 tablet (2.5 mg total) by mouth once a week. Take 30 minutes before Lasix (furosemide). 02/27/22  Yes Fay Records, MD  nitroGLYCERIN (NITROSTAT) 0.4 MG SL tablet Place 1 tablet (0.4 mg total) under the tongue every 5 (five) minutes as needed for chest pain. For chest pain 04/24/16  Yes Fay Records, MD  Omega-3 Fatty Acids (FISH OIL) 1000 MG CPDR Take 1,000 mg by mouth daily.   Yes [provider]  OXYGEN Inhale 4 L/min into the lungs as needed (DURING ALL TIMES OF EXERTION).    Yes [provider]  pantoprazole (PROTONIX) 40 MG tablet Take 40 mg by mouth daily.   Yes [provider]  pilocarpine (PILOCAR) 1 % ophthalmic solution Place 1 drop into both eyes 2 (two) times daily.   Yes [provider]  potassium chloride SA (KLOR-CON) 20 MEQ tablet Take 20 mEq by mouth daily with lunch.   Yes [provider]  pramipexole (MIRAPEX) 0.25 MG tablet Take 0.25 mg by mouth daily with supper. 08/22/20  Yes [provider]  spironolactone (ALDACTONE) 25 MG tablet TAKE 1 TABLET BY MOUTH DAILY Patient taking differently: Take 25 mg by mouth daily. 11/20/21  Yes Fay Records, MD  traZODone (DESYREL) 50 MG tablet Take 150 mg by mouth at bedtime.  10/07/18  Yes [provider]  vitamin C (ASCORBIC ACID) 500 MG tablet Take 500 mg by mouth daily with  lunch.   Yes [provider]  XTAMPZA ER 9 MG C12A Take 9 mg by mouth 2 (two) times daily. 09/25/20  Yes [provider]    Allergies    Allergies as of 06/05/2022 - Review Complete 06/05/2022  Allergen Reaction Noted   Ativan [lorazepam] Other (See Comments) 05/31/2018   Pitavastatin Itching 05/06/2017   Ropinirole Nausea Only and Other (See Comments) 05/06/2017   Zofran Alvis Lemmings hcl] Nausea Only 05/31/2018   Zolpidem tartrate Anxiety and Other (See Comments) 02/23/2012   Penicillins Rash and Other (See Comments) 04/12/2008    Social History   reports that she quit smoking about 23 years ago. Her smoking use included cigarettes. She has a 45.00 pack-year smoking history. She has never used smokeless tobacco. She reports that she does not drink alcohol and does not use drugs.   Family History   Her family history includes COPD in her father; Colitis in her sister; Heart disease in her father and sister; Lung cancer in her father; Ovarian cancer in her mother; Stomach cancer in her sister.   Objective   Blood pressure 116/64, pulse 96, temperature 98.3 F (36.8 C),  temperature source Oral, resp. rate (!) 21, height '5\' 8"'$  (1.727 m), weight 70.3 kg, SpO2 92 %.    General: Chronically ill but nontoxic-appearing female no distress HEENT: Moist mucous membranes Cardiac: Regular rate, irregular rhythm.  No lower extremity edema.  Extremities are warm. Pulm: Breathing comfortably on room air at rest.  Able to speak in full sentences.  Lung sounds diminished throughout, no wheezing, crackles appreciable on the right GI: Abdomen soft, nontender Skin: Warm and dry Neuro: Alert and oriented x4 Significant Diagnostic Tests:       Latest Ref Rng & Units 06/05/2022   10:13 AM 02/24/2022    9:12 AM 06/20/2021    1:32 PM  CBC  WBC 4.0 - 10.5 K/uL 10.4  9.1  8.5   Hemoglobin 12.0 - 15.0 g/dL 12.7  13.2  11.5   Hematocrit 36.0 - 46.0 % 40.0  40.7  34.9   Platelets 150 - 400  K/uL 392  220  235       Latest Ref Rng & Units 06/05/2022   10:13 AM 03/20/2022   10:32 AM 02/24/2022    9:12 AM  BMP  Glucose 70 - 99 mg/dL 100  93  78   BUN 8 - 23 mg/dL '18  23  20   '$ Creatinine 0.44 - 1.00 mg/dL 1.24  1.34  1.31   BUN/Creat Ratio 12 - '28  17  15   '$ Sodium 135 - 145 mmol/L 136  140  141   Potassium 3.5 - 5.1 mmol/L 4.1  4.7  4.8   Chloride 98 - 111 mmol/L 96  101  100   CO2 22 - 32 mmol/L '30  28  30   '$ Calcium 8.9 - 10.3 mg/dL 9.1  9.5  9.7    Chest x-ray: Right lower lobe pneumonia.  Summary  81 year old female with COPD admitted for community-acquired pneumonia.  Assessment & Plan:  Principal Problem:   CAP (community acquired pneumonia)  Community-acquired pneumonia Not hypoxic COPD with chronic respiratory failure (on 4 L with exertion), not in acute exacerbation - Admit to med/surg - Empirically treat with 5-day course of azithromycin and Rocephin - No hypoxia or significant wheezing appreciable on exam so we will defer steroids for now - Encourage frequent IS use - Continue LABA, LAMA, ICS - Consider repeat chest x-ray in 4 to 6 weeks to exclude underlying neoplasm   HFpEF, not in acute exacerbation Paroxysmal atrial fibrillation (CHA2DS2-VASc 8), history of DVT -Reportedly only had 1 prior episode status post CABG - CHA2DS2-VASc score 8.  Not on anticoagulation due to history of GI bleeding due to AVMs. - Currently in A-fib, likely due to pneumonia CAD status post CABG (2004) (DES 2010) PAD Hypertension, hyperlipidemia - Continue home medications including aspirin, Lipitor, Farxiga, spironolactone - Monitor volume status and diurese as needed.  On Lasix 80 mg every other day at home.  Chronic pain on chronic opioid therapy - Xtampza not available on hospital formulary.  Spoke with pharmacy.  Oxycodone 2.5 mg every 4 hours as needed. - Continue gabapentin and Flexeril  Depression/anxiety - Continue Abilify, Celexa, trazodone  Restless legs -  Continue Mirapex  Hypothyroidism - Continue Synthroid  CKD 3a.  Chronic and stable.  Monitor intermittently. Best practice:  CODE STATUS: DNR DVT for prophylaxis: Lovenox Social considerations/Family communication: Daughter updated at bedside Dispo: Admit patient to Observation with expected length of stay less than 2 midnights.   Mitzi Hansen, MD Internal Medicine Resident PGY-3 [facility] Internal Medicine  Residency Pager: [phone] 06/05/2022 6:11 PM

## 2022-06-05 NOTE — ED Provider Triage Note (Signed)
Emergency Medicine Provider Triage Evaluation Note  [name] , a 81 y.o. female  was evaluated in triage.  Pt complains of new onset A-fib and weakness.  Patient reports that she has been feeling weak over the last 2 weeks.  Patient states that yesterday she suffered a fall.  Patient is unsure of the exact mechanism of her fall but believes that she tripped.  She denies any lightheadedness loss of consciousness or hitting her head yesterday.  Patient went to her heart doctor Dr. Harrington Challenger earlier today and was found to be in atrial fibrillation with ventricular rate from the 80s to 120s.  Patient has had 1 previous episode of A-fib after her CABG procedure.  Patient is not on any anticoagulation.    Endorses nausea and minimal swelling to right ankle.  Review of Systems  Positive: Weakness, leg swelling, nausea Negative: Chest pain, palpitations, lightheadedness, syncope, neck pain, back pain, numbness, weakness, visual disturbance,   Physical Exam  BP 123/70 (BP Location: Right Arm)   Pulse (!) 102   Temp 98.3 F (36.8 C) (Oral)   Resp 16   SpO2 95%  Gen:   Awake, no distress   Resp:  Normal effort, clear to auscultation bilaterally MSK:   Moves extremities without difficulty  Other:  +2 radial pulse bilaterally.  Minimal edema to right ankle.  No tenderness on exam.  Medical Decision Making  Medically screening exam initiated at 10:05 AM.  Appropriate orders placed.  [name] was informed that the remainder of the evaluation will be completed by another provider, this initial triage assessment does not replace that evaluation, and the importance of remaining in the ED until their evaluation is complete.  New onset of A-fib with ventricular rate into the 120s.  RN was notified that patient needs to be moved back to next available room.   [name], [place] 06/05/22 1014

## 2022-06-05 NOTE — ED Notes (Signed)
Report received from Gabriel S., RN. 

## 2022-06-05 NOTE — ED Notes (Addendum)
This RN called 6N for SBAR information. Alexandra called at [phone] with no answer and ringback to Network engineer. Secretary states charge RN is about to review chart to make sure assigned RN will actually be receiving patient. This RN will continue to check for unit readiness to receive report.

## 2022-06-06 ENCOUNTER — Observation Stay (HOSPITAL_BASED_OUTPATIENT_CLINIC_OR_DEPARTMENT_OTHER): Payer: Medicare HMO

## 2022-06-06 DIAGNOSIS — I5032 Chronic diastolic (congestive) heart failure: Secondary | ICD-10-CM

## 2022-06-06 DIAGNOSIS — I480 Paroxysmal atrial fibrillation: Secondary | ICD-10-CM

## 2022-06-06 DIAGNOSIS — J189 Pneumonia, unspecified organism: Secondary | ICD-10-CM | POA: Diagnosis not present

## 2022-06-06 LAB — ECHOCARDIOGRAM COMPLETE
Height: 68 in
MV VTI: 1.69 cm2
S' Lateral: 3.2 cm
Weight: 2515.01 oz

## 2022-06-06 LAB — HIV ANTIBODY (ROUTINE TESTING W REFLEX): HIV Screen 4th Generation wRfx: NONREACTIVE

## 2022-06-06 MED ORDER — AZITHROMYCIN 250 MG PO TABS
ORAL_TABLET | ORAL | 0 refills | Status: AC
Start: 2022-06-07 — End: 2022-06-10

## 2022-06-06 MED ORDER — ACETAMINOPHEN 325 MG PO TABS
650.0000 mg | ORAL_TABLET | Freq: Four times a day (QID) | ORAL | Status: DC | PRN
  Administered 2022-06-06: 650 mg via ORAL
  Filled 2022-06-06: qty 2

## 2022-06-06 MED ORDER — CEFDINIR 300 MG PO CAPS
300.0000 mg | ORAL_CAPSULE | Freq: Two times a day (BID) | ORAL | Status: DC
  Administered 2022-06-06: 300 mg via ORAL
  Filled 2022-06-06 (×2): qty 1

## 2022-06-06 MED ORDER — METOLAZONE 2.5 MG PO TABS
2.5000 mg | ORAL_TABLET | ORAL | Status: DC
  Administered 2022-06-06: 2.5 mg via ORAL
  Filled 2022-06-06: qty 1

## 2022-06-06 MED ORDER — FUROSEMIDE 40 MG PO TABS
80.0000 mg | ORAL_TABLET | ORAL | Status: DC
  Administered 2022-06-06: 80 mg via ORAL
  Filled 2022-06-06: qty 2

## 2022-06-06 MED ORDER — CEFDINIR 300 MG PO CAPS: 300.0000 mg | ORAL_CAPSULE | Freq: Two times a day (BID) | ORAL | 0 refills | Status: AC

## 2022-06-06 NOTE — Progress Notes (Signed)
HD#0 Subjective:  Overnight Events: none  Patient assessed at bedside this AM. She states that she feels better than yesterday. She continues to have palpitations. She was able to eat breakfast this morning.  Pt is updated on the plan for today, and all questions and concerns are addressed.   Objective:  Vital signs in last 24 hours: Vitals:   06/05/22 1948 06/05/22 2058 06/05/22 2100 06/06/22 0547  BP: 128/62 132/71  140/64  Pulse: 83 90  83  Resp: '20 19  18  '$ Temp:  98.8 F (37.1 C)  98.4 F (36.9 C)  TempSrc:  Oral  Oral  SpO2: 96% 91%  95%  Weight:   71.3 kg   Height:   '5\' 8"'$  (1.727 m)    Supplemental O2: Nasal Cannula SpO2: 95 %   Physical Exam:  Constitutional: well-appearing, sitting up in bed  Cardiovascular: irregularly irregular, no m/r/g Pulmonary/Chest: normal work of breathing on 3L, crackles present on right side Abdominal: soft, non-tender, non-distended MSK: no edema in lower extremities bilaterally Skin: warm and dry Psych: normal mood and affect  Filed Weights   06/05/22 1005 06/05/22 2100  Weight: 70.3 kg 71.3 kg     Intake/Output Summary (Last 24 hours) at 06/06/2022 0733 Last data filed at 06/06/2022 0557 Gross per 24 hour  Intake 360 ml  Output --  Net 360 ml   Net IO Since Admission: 360 mL [06/06/22 0733]  Pertinent Labs:    Latest Ref Rng & Units 06/05/2022   10:13 AM 02/24/2022    9:12 AM 06/20/2021    1:32 PM  CBC  WBC 4.0 - 10.5 K/uL 10.4  9.1  8.5   Hemoglobin 12.0 - 15.0 g/dL 12.7  13.2  11.5   Hematocrit 36.0 - 46.0 % 40.0  40.7  34.9   Platelets 150 - 400 K/uL 392  220  235        Latest Ref Rng & Units 06/05/2022   10:13 AM 03/20/2022   10:32 AM 02/24/2022    9:12 AM  CMP  Glucose 70 - 99 mg/dL 100  93  78   BUN 8 - 23 mg/dL '18  23  20   '$ Creatinine 0.44 - 1.00 mg/dL 1.24  1.34  1.31   Sodium 135 - 145 mmol/L 136  140  141   Potassium 3.5 - 5.1 mmol/L 4.1  4.7  4.8   Chloride 98 - 111 mmol/L 96  101  100   CO2 22 -  32 mmol/L '30  28  30   '$ Calcium 8.9 - 10.3 mg/dL 9.1  9.5  9.7   Total Protein 6.5 - 8.1 g/dL 7.5     Total Bilirubin 0.3 - 1.2 mg/dL 0.6     Alkaline Phos 38 - 126 U/L 70     AST 15 - 41 U/L 16     ALT 0 - 44 U/L 17       Imaging: DG Chest 2 View  Result Date: 06/05/2022 CLINICAL DATA:  Cough.  Atrial fibrillation. EXAM: CHEST - 2 VIEW COMPARISON:  Chest radiographs and CTA 05/10/2021 FINDINGS: Sequelae of CABG are again identified. The cardiac silhouette is borderline enlarged. Aortic atherosclerosis and calcified bilateral breast implants are noted. There is a new moderately large region of airspace consolidation in the right lower lobe. Background hyperinflation, chronic interstitial coarsening, and biapical pleuroparenchymal scarring are noted. Prior cervical and lumbar spine fusion and right shoulder arthroplasty are noted. IMPRESSION: Right lower lobe pneumonia.  Followup PA and lateral chest X-ray is recommended in 3-4 weeks following trial of antibiotic therapy to ensure resolution and exclude underlying malignancy. Electronically Signed   By: Logan Bores M.D.   On: 06/05/2022 10:30    Assessment/Plan:   Principal Problem:   CAP (community acquired pneumonia)   Patient Summary: [name] is a 81 y.o. with a pertinent PMH of chronic respiratory failure (on 4 L of oxygen with exertion), HFpEF, PAD, GI bleeds, who presented with shortness of breath and fatigue and admitted on 6/15 for pneumonia and atrial fibrillation on HD#0.   RML pneumonia Patient has stable oxygen saturations on 3L [place]. On exam she has crackles present in right lung and she is able to talk in full sentences. Will transition IV antibiotics to oral. -Cefdinir 300 mg BID, last doses 6/19 -Azithromycin 250 mg, last dose 6/19  COPD with chronic hypoxic respiratory failure No wheezing appreciated on exam. She is using home oxygen supplemental therapy at 4L [place].  Atrial fibrillation EKG showed afib with RVR  on admission. She was given IV cardizem in the ED. Rates are controlled between 80-90 overnight. It is likely that atrial fibrillation with RVR episode was related to acute infection, however she has significant cardiovascular history with CABG in 2004. Dr. Harrington Challenger, her cardiologist outpatient, was consulted on admission and recommends echo for further evaluation. CHADVASC of 8 with HASBLED of 4.  -Echo pending -cardiology following  History of GI bleeding Last EGD was in 2018 and showed gastritis without erosions, small angiodysplastic lesions in bowel. Hgb stable.  History of recurrent DVT/PE s/p IVC filter placement IVC filter placed in 2019.  Chronic HFpEF Home medications include furosemide 80 mg(every other day) and metolazone (weekly). She is euvolemic on exam.  CAD  PAD HLD -aspirin 81 mg qd -atorvastatin 20 mg  HTN -Continue home medications including aspirin, Lipitor, Farxiga, spironolactone  CKD stage 3a Creatinine stable at 1.24. Baseline around 1.3-1.4. -trend BMP  Chronic pain on chronic opioid therapy -Oxycodone 2.5 mg every 4 hours as needed. -Continue gabapentin and Flexeril   Depression/ anxiety -continue abilify, celexa, trazodone  Restless leg syndrome -continue mirapex  Hypothyroidism TSH within normal limits.  -continue synthyroid  Diet: Normal VTE: Enoxaparin Code: DNR PT/OT recs: patient and family decline HH. Family Update:    Dispo: Anticipated discharge to Home today following echo.  Tatjana Turcott M. Marinna Blane, D.O.  Internal Medicine Resident, PGY-1 [facility] Residency  Pager: [phone] 7:33 AM, 06/06/2022   **Please contact the on call pager after 5 pm and on weekends at [phone].**

## 2022-06-06 NOTE — TOC Initial Note (Signed)
Transition of Care [facility]) - Initial/Assessment Note    Patient Details  Name: [name] MRN: [id number] Date of Birth: 04/19/1941  [facility] CM/SW Contact:    Marilu Favre, RN Phone Number: 06/06/2022, 1:46 PM  Clinical Narrative:                 Spoke to patient and family at bedside. Discussed PT recommendation for HHPT. Patient politely declined at this time she does not feel that she needs HHPT. Family in agreement. If she changes her mind post discharge she will get in contact with her PCP to arrange.   Patient has home oxygen and cane   Expected Discharge Plan: [place] Barriers to Discharge: No Barriers Identified   Patient Goals and CMS Choice Patient states their goals for this hospitalization and ongoing recovery are:: to return to home CMS Medicare.gov Compare Post Acute Care list provided to:: Patient Choice offered to / list presented to : Patient  Expected Discharge Plan and Services Expected Discharge Plan: [place]   Discharge Planning Services: CM Consult Post Acute Care Choice: [place] arrangements for the past 2 months: Single Family Home                 DME Arranged: N/A DME Agency: NA       HH Arranged: Refused HH          Prior Living Arrangements/Services Living arrangements for the past 2 months: Single Family Home Lives with:: Adult Children Patient language and need for interpreter reviewed:: Yes Do you feel safe going back to the place where you live?: Yes      Need for Family Participation in Patient Care: Yes (Comment) Care giver support system in place?: Yes (comment) Current home services: DME Criminal Activity/Legal Involvement Pertinent to Current Situation/Hospitalization: No - Comment as needed  Activities of Daily Living Home Assistive Devices/Equipment: Cane (specify quad or straight) (cane) ADL Screening (condition at time of admission) Patient's  cognitive ability adequate to safely complete daily activities?: Yes Is the patient deaf or have difficulty hearing?: No Does the patient have difficulty seeing, even when wearing glasses/contacts?: No Does the patient have difficulty concentrating, remembering, or making decisions?: No Patient able to express need for assistance with ADLs?: Yes Does the patient have difficulty dressing or bathing?: No Independently performs ADLs?: Yes (appropriate for developmental age) Does the patient have difficulty walking or climbing stairs?: No Weakness of Legs: None Weakness of Arms/Hands: Both  Permission Sought/Granted   Permission granted to share information with : Yes, Verbal Permission Granted  Share Information with NAME: daughter           Emotional Assessment Appearance:: Appears stated age Attitude/Demeanor/Rapport: Engaged Affect (typically observed): Accepting Orientation: : Oriented to Self, Oriented to Place, Oriented to  Time, Oriented to Situation Alcohol / Substance Use: Not Applicable Psych Involvement: No (comment)  Admission diagnosis:  CAP (community acquired pneumonia) [J18.9] Atrial fibrillation with RVR ([place]) [I48.91] Community acquired pneumonia of right middle lobe of lung [J18.9] Patient Active Problem List   Diagnosis Date Noted   Chronic pain 05/11/2021   Acute exacerbation of congestive heart failure ([place]) 05/11/2021   Chest pain 05/10/2021   COPD exacerbation ([place]) 04/29/2021   Prolonged QT interval 04/29/2021   Depression 04/29/2021   RLS (restless legs syndrome) 04/29/2021   CAD (coronary artery disease) of bypass graft 02/07/2021   CHF (congestive heart failure) ([place]) 02/06/2021  CHF (congestive heart failure), NYHA class III, acute, diastolic (HCC) 73/41/9379   Normocytic anemia 10/28/2020   Acute respiratory failure with hypoxia ([place]) 10/09/2020   Hypotension 10/09/2020   CAP (community acquired pneumonia) 10/09/2020   L Subclavian Artery  Stenosis    S/P hip replacement, left 02/07/2020   History of pulmonary embolism 10/04/2019   Dyspnea 12/24/2018   Cellulitis 12/24/2018   Otitis media 12/24/2018   Diuretic-induced hypokalemia 10/08/2018   Acute on chronic diastolic heart failure ([place]) 10/06/2018   Hematuria 04/01/2018   Acute lower UTI 04/01/2018   Hematoma of abdominal wall, subsequent encounter 04/01/2018   Chronic diastolic heart failure ([place]) 03/29/2018   GERD without esophagitis 03/29/2018   Chronic gout 03/29/2018   Chronic bilateral low back pain without sciatica 03/29/2018   Colitis 03/10/2018   AKI (acute kidney injury) ([place])    Ascites    Pulmonary embolism ([place]) 03/01/2018   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[place]) 03/01/2018   Acute on chronic respiratory failure with hypoxia ([place]) 03/01/2018   Angiodysplasia of intestine    Hematochezia    Malnutrition of moderate degree 02/15/2017   Blood loss anemia 02/13/2017   Impingement syndrome of left shoulder 02/10/2013   PAD (peripheral artery disease) ([place]) 11/17/2011   Dyslipidemia 10/17/2011   Coronary artery disease involving native coronary artery of native heart with angina pectoris ([place])    Hypothyroidism 05/03/2008   Essential hypertension 05/03/2008   Sleep apnea 05/03/2008   COPD (chronic obstructive pulmonary disease) ([place]) 04/12/2008   PCP:  [name], MD Pharmacy:   [name], [place] [address] Phone: [phone] Fax: [name], [place] [address] Phone: [phone] Fax: [phone]     Social Determinants of Health (SDOH) Interventions    Readmission Risk Interventions    05/13/2021    1:12 PM  Readmission Risk Prevention Plan  Transportation Screening Complete  Medication Review (RN Care Manager) Complete  HRI or [place] Complete  SW Recovery  Care/Counseling Consult Complete  Palliative Care Screening Not [place] Not Applicable

## 2022-06-06 NOTE — Evaluation (Signed)
Occupational Therapy Evaluation Patient Details Name: [name] MRN: [id number] DOB: [birth year] Today's Date: 06/06/2022   History of Present Illness Pt is an 81 y/o female admitted secondary to SOB. Found to have CAP. PMH includes COPD on supplemental O2, CAD, dCHF, HTN, DVT, and PVD.   Clinical Impression   Patient admitted for the diagnosis above.  PTA she lives aline, but her granddaughter will be staying with her initially.  She is very close to her baseline.  Patient did desaturate to 88% on 4L with ADL completion, but did rebound to 95% with cues to PLB.  Energy conservation and taking rest breaks was discussed, patient verbalizes understanding.  No acute OT identified, and no post acute OT anticipated.        Recommendations for follow up therapy are one component of a multi-disciplinary discharge planning process, led by the attending physician.  Recommendations may be updated based on patient status, additional functional criteria and insurance authorization.   Follow Up Recommendations  No OT follow up    Assistance Recommended at Discharge Intermittent Supervision/Assistance  Patient can return home with the following      Functional Status Assessment  Patient has had a recent decline in their functional status and demonstrates the ability to make significant improvements in function in a reasonable and predictable amount of time.  Equipment Recommendations  None recommended by OT    Recommendations for Other Services       Precautions / Restrictions Precautions Precautions: Fall Restrictions Weight Bearing Restrictions: No      Mobility Bed Mobility               General bed mobility comments: up in recliner Patient Response: Cooperative  Transfers Overall transfer level: Needs assistance Equipment used: Straight cane Transfers: Sit to/from Stand, Bed to chair/wheelchair/BSC Sit to Stand: Supervision     Step pivot transfers: Supervision             Balance   Sitting-balance support: No upper extremity supported, Feet supported Sitting balance-Leahy Scale: Normal     Standing balance support: Single extremity supported Standing balance-Leahy Scale: Fair                             ADL either performed or assessed with clinical judgement   ADL Overall ADL's : At baseline                                       General ADL Comments: setup and generalized supervision     Vision Patient Visual Report: No change from baseline       Perception Perception Perception: Not tested   Praxis Praxis Praxis: Not tested    Pertinent Vitals/Pain Pain Assessment Pain Assessment: No/denies pain Pain Intervention(s): Monitored during session     Hand Dominance Right   Extremity/Trunk Assessment Upper Extremity Assessment Upper Extremity Assessment: Overall WFL for tasks assessed   Lower Extremity Assessment Lower Extremity Assessment: Defer to PT evaluation   Cervical / Trunk Assessment Cervical / Trunk Assessment: Kyphotic   Communication Communication Communication: No difficulties   Cognition Arousal/Alertness: Awake/alert Behavior During Therapy: WFL for tasks assessed/performed Overall Cognitive Status: Within Functional Limits for tasks assessed  General Comments   Watch O2 sat    Exercises     Shoulder Instructions      Home Living Family/patient expects to be discharged to:: Private residence Living Arrangements: Alone Available Help at Discharge: Family;Available 24 hours/day;Other (Comment) (Granddaughter will stay with her initially) Type of Home: House Home Access: Stairs to enter [employer] of Steps: 2 Entrance Stairs-Rails: Right;Left;Can reach both Home Layout: One level     Bathroom Shower/Tub: Occupational psychologist: Handicapped height Bathroom Accessibility: Yes How  Accessible: Accessible via walker Home Equipment: [employer] - single point;Rollator (4 wheels);Shower seat;BSC/3in1          Prior Functioning/Environment Prior Level of Function : Independent/Modified Independent             Mobility Comments: Uses cane for ambulation ADLs Comments: Completes her own ADL and iADL        OT Problem List: Decreased activity tolerance      OT Treatment/Interventions:      OT Goals(Current goals can be found in the care plan section) Acute Rehab OT Goals Patient Stated Goal: Hoping to return home OT Goal Formulation: With patient Time For Goal Achievement: 06/09/22 Potential to Achieve Goals: Good  OT Frequency:      Co-evaluation              AM-PAC OT "6 Clicks" Daily Activity     Outcome Measure Help from another person eating meals?: None Help from another person taking care of personal grooming?: None Help from another person toileting, which includes using toliet, bedpan, or urinal?: A Little Help from another person bathing (including washing, rinsing, drying)?: A Little Help from another person to put on and taking off regular upper body clothing?: None Help from another person to put on and taking off regular lower body clothing?: A Little 6 Click Score: 21   End of Session Equipment Utilized During Treatment: Oxygen Nurse Communication: Mobility status  Activity Tolerance: Patient tolerated treatment well Patient left: in chair;with call bell/phone within reach  OT Visit Diagnosis: Unsteadiness on feet (R26.81)                Time: 1119-1140 OT Time Calculation (min): 21 min Charges:  OT General Charges $OT Visit: 1 Visit OT Evaluation $OT Eval Moderate Complexity: 1 Mod  06/06/2022  RP, OTR/L  Acute Rehabilitation Services  Office:  [phone]   Metta Clines 06/06/2022, 10:47 AM

## 2022-06-06 NOTE — Progress Notes (Signed)
  Echocardiogram 2D Echocardiogram has been performed.  [name] 06/06/2022, 3:46 PM

## 2022-06-06 NOTE — Progress Notes (Addendum)
Progress Note  Patient Name: [name] Date of Encounter: 06/06/2022  [facility] Cardiologist: Dorris Carnes, MD   Subjective   Patient denies any chest pain. Does feel SOB, occasional palpitations. Patient lives alone   Inpatient Medications    Scheduled Meds:  allopurinol  100 mg Oral Q breakfast   ARIPiprazole  2 mg Oral Q breakfast   aspirin EC  81 mg Oral Q breakfast   atorvastatin  20 mg Oral Q supper   azithromycin  250 mg Oral Daily   budesonide (PULMICORT) nebulizer solution  0.25 mg Nebulization BID   cefdinir  300 mg Oral Q12H   citalopram  10 mg Oral Daily   dapagliflozin propanediol  10 mg Oral QAC breakfast   docusate sodium  100 mg Oral BID   enoxaparin (LOVENOX) injection  40 mg Subcutaneous Q24H   fluticasone furoate-vilanterol  1 puff Inhalation Daily   furosemide  80 mg Oral QODAY   gabapentin  200 mg Oral QHS   guaiFENesin  600 mg Oral BID   levothyroxine  50 mcg Oral QAC breakfast   melatonin  10 mg Oral QHS   metolazone  2.5 mg Oral Weekly   pantoprazole  40 mg Oral Daily   pilocarpine  1 drop Both Eyes BID   pramipexole  0.25 mg Oral Q supper   spironolactone  25 mg Oral Daily   traZODone  150 mg Oral QHS   Continuous Infusions:  PRN Meds: acetaminophen, albuterol, cyclobenzaprine, oxyCODONE   Vital Signs    Vitals:   06/05/22 2100 06/06/22 0547 06/06/22 0819 06/06/22 1149  BP:  140/64 121/70 118/64  Pulse:  83 84 85  Resp:  '18 18 17  '$ Temp:  98.4 F (36.9 C) 98.4 F (36.9 C) 98.1 F (36.7 C)  TempSrc:  Oral Oral Oral  SpO2:  95% 93% 96%  Weight: 71.3 kg     Height: '5\' 8"'$  (1.727 m)       Intake/Output Summary (Last 24 hours) at 06/06/2022 1432 Last data filed at 06/06/2022 1151 Gross per 24 hour  Intake 480 ml  Output --  Net 480 ml      06/05/2022    9:00 PM 06/05/2022   10:05 AM 06/05/2022    8:34 AM  Last 3 Weights  Weight (lbs) 157 lb 3 oz 155 lb 155 lb 6.4 oz  Weight (kg) 71.3 kg 70.308 kg 70.489 kg       Telemetry    Not on telemetry - Personally Reviewed  ECG     Sinus rhythm with PACs, HR 105 BPM, PVC present- Personally Reviewed  Physical Exam   GEN: No acute distress. Undergoing echocardiogram at the time of my eval, so physical was limited  Neck: No JVD Respiratory: No increased WOB, on room air   GI: Soft, nontender, non-distended  MS: No edema; dorsalis pedis pulses 2+ bilaterally  Neuro:  Nonfocal  Psych: Normal affect   Labs    High Sensitivity Troponin:   Recent Labs  Lab 06/05/22 1013  TROPONINIHS 13     Chemistry Recent Labs  Lab 06/05/22 1013  NA 136  K 4.1  CL 96*  CO2 30  GLUCOSE 100*  BUN 18  CREATININE 1.24*  CALCIUM 9.1  MG 2.3  PROT 7.5  ALBUMIN 2.7*  AST 16  ALT 17  ALKPHOS 70  BILITOT 0.6  GFRNONAA 44*  ANIONGAP 10    Lipids No results for input(s): "CHOL", "TRIG", "HDL", "LABVLDL", "LDLCALC", "  CHOLHDL" in the last 168 hours.  Hematology Recent Labs  Lab 06/05/22 1013  WBC 10.4  RBC 4.54  HGB 12.7  HCT 40.0  MCV 88.1  MCH 28.0  MCHC 31.8  RDW 15.1  PLT 392   Thyroid  Recent Labs  Lab 06/05/22 1013  TSH 3.825    BNP Recent Labs  Lab 06/05/22 1013  BNP 301.2*    DDimer No results for input(s): "DDIMER" in the last 168 hours.   Radiology    DG Chest 2 View  Result Date: 06/05/2022 CLINICAL DATA:  Cough.  Atrial fibrillation. EXAM: CHEST - 2 VIEW COMPARISON:  Chest radiographs and CTA 05/10/2021 FINDINGS: Sequelae of CABG are again identified. The cardiac silhouette is borderline enlarged. Aortic atherosclerosis and calcified bilateral breast implants are noted. There is a new moderately large region of airspace consolidation in the right lower lobe. Background hyperinflation, chronic interstitial coarsening, and biapical pleuroparenchymal scarring are noted. Prior cervical and lumbar spine fusion and right shoulder arthroplasty are noted. IMPRESSION: Right lower lobe pneumonia. Followup PA and lateral chest X-ray  is recommended in 3-4 weeks following trial of antibiotic therapy to ensure resolution and exclude underlying malignancy. Electronically Signed   By: Logan Bores M.D.   On: 06/05/2022 10:30    Cardiac Studies   Echocardiogram   Pending   Patient Profile     81 y.o. female with a history of CAD s/p CABG 2004, HFpEF, history of DVT, anemia, anxiety, carotid artery disease, PVD, OSA, history of GI bleed due to AVMs.   Assessment & Plan    Paroxysmal Atrial Fibrillation  - Reportedly, patient had an episode of atrial fibrillation after CABG in 2004  - I have reviewed patient's EKGs from admission, and there appear to be visible P waves present. Patient not on telemetry.  Will discuss with MD  - Per recorded vital signs, HR is well controlled in the 80s-90s  - Not on any rate controlling medications  - CHADS-VASc 8  - Not on anticoagulation due to history of GI bleed, AVMs (diagnosed about 4 years ago). Although patient does have a CHADS-VASc score that indicates she should be on [facility], I am hesitant to start as she has high bleeding risk    - Echocardiogram pending  -  Consider outpatient monitor to assess afib burden   Chronic HFpEF  - Most recent echocardiogram in 09/2020 showed LVEF 60-65%, moderate LVH, low-normal RV systolic function, moderately dilated LA and RA - Echocardiogram this admission pending  - Does not appear to be decompensated  - Continue home lasix 80 mg every other day - Continue home lipitor, farxiga, spiro   CAD s/p CABG 2004  DES to LM in 2010  - Patient denies chest pain  - Continue ASA, statin  - Per chart review-- intolerant to metoprolol   Hx of DVT  - S/p IVC filter  - Not on AC due to history of GI bleed   Carotid Artery Disease  - Followed in the outpatient setting  - Moderate disease that has been stable   For questions or updates, please contact [facility] Please consult [email or web address] for contact info under        Signed, Margie Billet, PA-C  06/06/2022, 2:32 PM    Pt not seen prior to her discharge shortly after echo. Echocardiogram reviewed. Felt stable for discharge from primary service and they felt cardiology workup could continue outpatient. High risk for [facility], recommend review with primary cardiologist as  outpatient.   Elouise Munroe, MD 06/06/22 6:21 PM

## 2022-06-06 NOTE — Plan of Care (Signed)

## 2022-06-06 NOTE — Evaluation (Signed)
Physical Therapy Evaluation Patient Details Name: [name] MRN: [id number] DOB: 1941-02-12 Today's Date: 06/06/2022  History of Present Illness  Pt is an 81 y/o female admitted secondary to SOB. Found to have CAP. PMH includes COPD on supplemental O2, CAD, dCHF, HTN, DVT, and PVD.  Clinical Impression  Pt admitted secondary to problem above with deficits below. Pt requiring min guard A for mobility tasks using RW. Distance limited secondary to fatigue. Pt reports family can stay with her as needed at d/c. Recommending HHPT to address current mobility deficits. Will continue to follow acutely.        Recommendations for follow up therapy are one component of a multi-disciplinary discharge planning process, led by the attending physician.  Recommendations may be updated based on patient status, additional functional criteria and insurance authorization.  Follow Up Recommendations Home health PT    Assistance Recommended at Discharge Frequent or constant Supervision/Assistance (initially)  Patient can return home with the following  A lot of help with bathing/dressing/bathroom;Assistance with cooking/housework;Help with stairs or ramp for entrance;Assist for transportation    Equipment Recommendations None recommended by PT  Recommendations for Other Services       Functional Status Assessment Patient has had a recent decline in their functional status and demonstrates the ability to make significant improvements in function in a reasonable and predictable amount of time.     Precautions / Restrictions Precautions Precautions: Fall Restrictions Weight Bearing Restrictions: No      Mobility  Bed Mobility Overal bed mobility: Needs Assistance Bed Mobility: Supine to Sit     Supine to sit: Supervision     General bed mobility comments: supervision for safety    Transfers Overall transfer level: Needs assistance Equipment used: Rolling walker (2 wheels) Transfers:  Sit to/from Stand Sit to Stand: Min guard           General transfer comment: Min guard for safety    Ambulation/Gait Ambulation/Gait assistance: Min guard Gait Distance (Feet): 100 Feet Assistive device: Rolling walker (2 wheels) Gait Pattern/deviations: Step-through pattern, Decreased stride length Gait velocity: Decreased     General Gait Details: min guard for safety. No overt LOB noted. Distance limited secondary to fatigue.  Stairs            Wheelchair Mobility    Modified Rankin (Stroke Patients Only)       Balance Overall balance assessment: Needs assistance Sitting-balance support: No upper extremity supported, Feet supported Sitting balance-Leahy Scale: Good     Standing balance support: Bilateral upper extremity supported, No upper extremity supported Standing balance-Leahy Scale: Fair Standing balance comment: Can maintain static standing without UE support                             Pertinent Vitals/Pain Pain Assessment Pain Assessment: 0-10 Pain Score: 7  Pain Location: lower back Pain Descriptors / Indicators: Discomfort Pain Intervention(s): Limited activity within patient's tolerance, Monitored during session, Repositioned    Home Living Family/patient expects to be discharged to:: Private residence Living Arrangements: Alone Available Help at Discharge: Family;Available 24 hours/day (daughter and granddaughter) Type of Home: House Home Access: Stairs to enter Entrance Stairs-Rails: Right;Left;Can reach both Entrance Stairs-Number of Steps: 2   Home Layout: One level Home Equipment: Cane - single point;Rollator (4 wheels);Shower seat;BSC/3in1      Prior Function Prior Level of Function : Independent/Modified Independent  Mobility Comments: Uses cane for ambulation       Hand Dominance        Extremity/Trunk Assessment   Upper Extremity Assessment Upper Extremity Assessment: Defer to OT  evaluation    Lower Extremity Assessment Lower Extremity Assessment: Generalized weakness    Cervical / Trunk Assessment Cervical / Trunk Assessment: Kyphotic  Communication   Communication: No difficulties  Cognition Arousal/Alertness: Awake/alert Behavior During Therapy: WFL for tasks assessed/performed Overall Cognitive Status: No family/caregiver present to determine baseline cognitive functioning                                 General Comments: question possible memory deficits        General Comments      Exercises     Assessment/Plan    PT Assessment Patient needs continued PT services  PT Problem List Decreased strength;Decreased activity tolerance;Decreased mobility;Decreased balance;Decreased knowledge of use of DME;Decreased knowledge of precautions;Decreased cognition       PT Treatment Interventions DME instruction;Gait training;Stair training;Functional mobility training;Therapeutic activities;Therapeutic exercise;Balance training;Patient/family education    PT Goals (Current goals can be found in the Care Plan section)  Acute Rehab PT Goals Patient Stated Goal: to go home PT Goal Formulation: With patient Time For Goal Achievement: 06/20/22 Potential to Achieve Goals: Good    Frequency Min 3X/week     Co-evaluation               AM-PAC PT "6 Clicks" Mobility  Outcome Measure Help needed turning from your back to your side while in a flat bed without using bedrails?: None Help needed moving from lying on your back to sitting on the side of a flat bed without using bedrails?: A Little Help needed moving to and from a bed to a chair (including a wheelchair)?: A Little Help needed standing up from a chair using your arms (e.g., wheelchair or bedside chair)?: A Little Help needed to walk in hospital room?: A Little Help needed climbing 3-5 steps with a railing? : A Little 6 Click Score: 19    End of Session Equipment Utilized  During Treatment: Gait belt;Oxygen Activity Tolerance: Patient tolerated treatment well Patient left: in chair;with call bell/phone within reach;with chair alarm set (respiratory therapy present) Nurse Communication: Mobility status PT Visit Diagnosis: Other abnormalities of gait and mobility (R26.89);Unsteadiness on feet (R26.81);Muscle weakness (generalized) (M62.81)    Time: 1517-6160 PT Time Calculation (min) (ACUTE ONLY): 17 min   Charges:   PT Evaluation $PT Eval Low Complexity: 1 Low          Reuel Derby, PT, DPT  Acute Rehabilitation Services  Office: [phone]   Rudean Hitt 06/06/2022, 9:23 AM

## 2022-06-06 NOTE — Discharge Summary (Cosign Needed Addendum)
Name: [name] MRN: [id number] DOB: 14-Mar-1941 81 y.o. PCP: [name], MD  Date of Admission: 06/05/2022 10:02 AM Date of Discharge: 06/06/22 Attending Physician: Velna Ochs, MD  Discharge Diagnosis: 1.  Community-acquired pneumonia 2.  Paroxysmal atrial fibrillation  Discharge Medications: Allergies as of 06/06/2022       Reactions   Ativan [lorazepam] Other (See Comments)   Confusion    Pitavastatin Itching   Reaction to Livalo   Ropinirole Nausea Only, Other (See Comments)   Makes legs jump, also   Zofran [ondansetron Hcl] Nausea Only   Zolpidem Tartrate Anxiety, Other (See Comments)   CONFUSION    Penicillins Rash, Other (See Comments)   Tolerated cephalosporins in past Has patient had a PCN reaction causing immediate rash, facial/tongue/throat swelling, SOB or lightheadedness with hypotension: Yes Has patient had a PCN reaction causing severe rash involving mucus membranes or skin necrosis: No Has patient had a PCN reaction that required hospitalization: No Has patient had a PCN reaction occurring within the last 10 years: No If all of the above answers are "NO", then may proceed with Cephalosporin use.        Medication List     TAKE these medications    albuterol (2.5 MG/3ML) 0.083% nebulizer solution Commonly known as: PROVENTIL Take 2.5 mg by nebulization every 6 (six) hours as needed for wheezing or shortness of breath.   allopurinol 100 MG tablet Commonly known as: ZYLOPRIM Take 100 mg by mouth daily with breakfast.   amLODipine 2.5 MG tablet Commonly known as: NORVASC Take 1/2 tablet as needed for BP greater than 160.   ARIPiprazole 2 MG tablet Commonly known as: ABILIFY Take 2 mg by mouth daily with breakfast.   aspirin EC 81 MG tablet Take 81 mg by mouth daily with breakfast. Swallow whole.   atorvastatin 20 MG tablet Commonly known as: LIPITOR Take 20 mg by mouth daily with supper.   azithromycin 250 MG  tablet Commonly known as: ZITHROMAX Take one daily Start taking on: June 07, 2022   cefdinir 300 MG capsule Commonly known as: OMNICEF Take 1 capsule (300 mg total) by mouth every 12 (twelve) hours for 4 days.   cholecalciferol 25 MCG (1000 UNIT) tablet Commonly known as: VITAMIN D3 Take 1,000 Units by mouth daily with supper.   citalopram 10 MG tablet Commonly known as: CELEXA Take 10 mg by mouth daily.   Combivent Respimat 20-100 MCG/ACT Aers respimat Generic drug: Ipratropium-Albuterol Inhale 1 puff into the lungs 3 (three) times daily as needed for wheezing or shortness of breath.   cyclobenzaprine 10 MG tablet Commonly known as: FLEXERIL Take 10 mg by mouth 3 (three) times daily as needed for muscle spasms.   dapagliflozin propanediol 10 MG Tabs tablet Commonly known as: Farxiga Take 1 tablet (10 mg total) by mouth daily before breakfast.   DSS 100 MG Caps Take 100 mg by mouth daily as needed (constipation).   Fish Oil 1000 MG Cpdr Take 1,000 mg by mouth daily.   furosemide 80 MG tablet Commonly known as: LASIX Take 1 tablet (80 mg total) by mouth daily. What changed: when to take this   gabapentin 100 MG capsule Commonly known as: NEURONTIN Take 200 mg by mouth at bedtime.   levothyroxine 50 MCG tablet Commonly known as: SYNTHROID TAKE 1 TABLET BY MOUTH DAILY AT 6AM. What changed: See the new instructions.   magnesium gluconate 500 MG tablet Commonly known as: MAGONATE Take 500 mg by mouth daily.  Melatonin 10 MG Tabs Take 30 mg by mouth at bedtime.   metolazone 2.5 MG tablet Commonly known as: ZAROXOLYN Take 1 tablet (2.5 mg total) by mouth once a week. Take 30 minutes before Lasix (furosemide).   nitroGLYCERIN 0.4 MG SL tablet Commonly known as: NITROSTAT Place 1 tablet (0.4 mg total) under the tongue every 5 (five) minutes as needed for chest pain. For chest pain   OXYGEN Inhale 4 L/min into the lungs as needed (DURING ALL TIMES OF  EXERTION).   pantoprazole 40 MG tablet Commonly known as: PROTONIX Take 40 mg by mouth daily.   pilocarpine 1 % ophthalmic solution Commonly known as: PILOCAR Place 1 drop into both eyes 2 (two) times daily.   potassium chloride SA 20 MEQ tablet Commonly known as: KLOR-CON M Take 20 mEq by mouth daily with lunch.   pramipexole 0.25 MG tablet Commonly known as: MIRAPEX Take 0.25 mg by mouth daily with supper.   spironolactone 25 MG tablet Commonly known as: ALDACTONE TAKE 1 TABLET BY MOUTH DAILY   traZODone 50 MG tablet Commonly known as: DESYREL Take 150 mg by mouth at bedtime.   Trelegy Ellipta 200-62.5-25 MCG/ACT Aepb Generic drug: Fluticasone-Umeclidin-Vilant Inhale 1 puff into the lungs daily.   vitamin C 500 MG tablet Commonly known as: ASCORBIC ACID Take 500 mg by mouth daily with lunch.   Xtampza ER 9 MG C12a Generic drug: oxyCODONE ER Take 9 mg by mouth 2 (two) times daily.        Disposition and follow-up:   JudyJudy [name] was discharged from [facility] in Stable condition.  At the hospital follow up visit please address:  Pneumonia- discharged with cefdinir and azithromycin for 5 day course   Labs / imaging needed at time of follow-up: repeat cxr in 4-6 weeks, (after 7/14)  Pending labs/ test needing follow-up: none  Follow-up Appointments: With Dr. Harrington Challenger and PCP  Hospital Course: 81 year old female with pertinent history of COPD with chronic respiratory failure (on 4 L with exertion) and paroxysmal atrial fibrillation who was sent to the ED after being seen in her cardiologist office where she had noted 2-week history of significant fatigue, shortness of breath, cough, palpitations and general malaise.  Work-up in the ED revealed a WBC of 10.4 and chest x-ray with right lower lobe pneumonia.  She was started on azithromycin and Rocephin.  She did not have an increased oxygen requirement and remained afebrile over the course  of her hospitalization.  She was noted to be in atrial fibrillation at her cardiologist's visit as well as on admission.  Per cardiology, she had a brief episode of A-fib after her CABG several years ago but otherwise is not known to have significant burden.  Her CHA2DS2-VASc is 8 however she was not previously on anticoagulation due to history of GI bleeding due to AVMs. TSH normal.  Echocardiogram was obtained this admission and revealed normal EF with no wall motion abnormalities.   Suspect that acute infection is the primary instigator of recurrent A-fib.  She follows closely with cardiology who can continue to weigh the risks and benefits of anticoagulation.  Discharge Vitals:   BP 118/64 (BP Location: Left Arm)   Pulse 85   Temp 98.1 F (36.7 C) (Oral)   Resp 17   Ht '5\' 8"'$  (1.727 m)   Wt 71.3 kg   SpO2 96%   BMI 23.90 kg/m   Pertinent Labs, Studies, and Procedures:     Latest Ref  Rng & Units 06/05/2022   10:13 AM 02/24/2022    9:12 AM 06/20/2021    1:32 PM  CBC  WBC 4.0 - 10.5 K/uL 10.4  9.1  8.5   Hemoglobin 12.0 - 15.0 g/dL 12.7  13.2  11.5   Hematocrit 36.0 - 46.0 % 40.0  40.7  34.9   Platelets 150 - 400 K/uL 392  220  235       Latest Ref Rng & Units 06/05/2022   10:13 AM 03/20/2022   10:32 AM 02/24/2022    9:12 AM  CMP  Glucose 70 - 99 mg/dL 100  93  78   BUN 8 - 23 mg/dL '18  23  20   '$ Creatinine 0.44 - 1.00 mg/dL 1.24  1.34  1.31   Sodium 135 - 145 mmol/L 136  140  141   Potassium 3.5 - 5.1 mmol/L 4.1  4.7  4.8   Chloride 98 - 111 mmol/L 96  101  100   CO2 22 - 32 mmol/L '30  28  30   '$ Calcium 8.9 - 10.3 mg/dL 9.1  9.5  9.7   Total Protein 6.5 - 8.1 g/dL 7.5     Total Bilirubin 0.3 - 1.2 mg/dL 0.6     Alkaline Phos 38 - 126 U/L 70     AST 15 - 41 U/L 16     ALT 0 - 44 U/L 17      TSH 3.8  ECHOCARDIOGRAM COMPLETE  Result Date: 06/06/2022    ECHOCARDIOGRAM REPORT   Patient Name:   [name] Date of Exam: 06/06/2022 Medical Rec #:  [id number]             Height:       68.0 in Accession #:    [id number]           Weight:       157.2 lb Date of Birth:  01/17/1941             BSA:          1.845 m Patient Age:    24 years             BP:           121/70 mmHg Patient Gender: F                    HR:           84 bpm. Exam Location:  Inpatient Procedure: 2D Echo Indications:    congestive heart failure  History:        Patient has prior history of Echocardiogram examinations, most                 recent 10/09/2020. CHF, CAD, Prior CABG and Abnormal ECG, COPD;                 Risk Factors:Hypertension, Dyslipidemia and Sleep Apnea.  Sonographer:    Johny Chess RDCS Referring Phys: [id number] Velna Ochs  Sonographer Comments: Image acquisition challenging due to breast implants. IMPRESSIONS  1. Left ventricular ejection fraction, by estimation, is 60 to 65%. The left ventricle has normal function. The left ventricle has no regional wall motion abnormalities. There is mild left ventricular hypertrophy. Left ventricular diastolic parameters are indeterminate.  2. Right ventricular systolic function is normal. The right ventricular size is normal. There is normal pulmonary artery systolic pressure.  3. Left atrial size was moderately dilated.  4. Right atrial size was  moderately dilated.  5. MV diastolic gradients not measured but no significant MS based on pressure half time. The mitral valve is degenerative. Mild mitral valve regurgitation. No evidence of mitral stenosis. Severe mitral annular calcification.  6. The aortic valve is tricuspid. There is mild calcification of the aortic valve. There is mild thickening of the aortic valve. Aortic valve regurgitation is mild. Aortic valve sclerosis is present, with no evidence of aortic valve stenosis.  7. The inferior vena cava is dilated in size with >50% respiratory variability, suggesting right atrial pressure of 8 mmHg. FINDINGS  Left Ventricle: Left ventricular ejection fraction, by estimation, is 60 to 65%. The  left ventricle has normal function. The left ventricle has no regional wall motion abnormalities. The left ventricular internal cavity size was normal in size. There is  mild left ventricular hypertrophy. Left ventricular diastolic parameters are indeterminate. Right Ventricle: The right ventricular size is normal. No increase in right ventricular wall thickness. Right ventricular systolic function is normal. There is normal pulmonary artery systolic pressure. The tricuspid regurgitant velocity is 2.29 m/s, and  with an assumed right atrial pressure of 8 mmHg, the estimated right ventricular systolic pressure is 93.8 mmHg. Left Atrium: Left atrial size was moderately dilated. Right Atrium: Right atrial size was moderately dilated. Pericardium: There is no evidence of pericardial effusion. Mitral Valve: MV diastolic gradients not measured but no significant MS based on pressure half time. The mitral valve is degenerative in appearance. There is severe thickening of the mitral valve leaflet(s). There is severe calcification of the mitral valve leaflet(s). Severe mitral annular calcification. Mild mitral valve regurgitation. No evidence of mitral valve stenosis. Tricuspid Valve: The tricuspid valve is normal in structure. Tricuspid valve regurgitation is mild . No evidence of tricuspid stenosis. Aortic Valve: The aortic valve is tricuspid. There is mild calcification of the aortic valve. There is mild thickening of the aortic valve. Aortic valve regurgitation is mild. Aortic valve sclerosis is present, with no evidence of aortic valve stenosis. Pulmonic Valve: The pulmonic valve was normal in structure. Pulmonic valve regurgitation is trivial. No evidence of pulmonic stenosis. Aorta: The aortic root is normal in size and structure. Venous: The inferior vena cava is dilated in size with greater than 50% respiratory variability, suggesting right atrial pressure of 8 mmHg. IAS/Shunts: No atrial level shunt detected by  color flow Doppler.  LEFT VENTRICLE PLAX 2D LVIDd:         3.90 cm LVIDs:         3.20 cm LV PW:         1.00 cm LV IVS:        1.20 cm LVOT diam:     1.80 cm LV SV:         60 LV SV Index:   32 LVOT Area:     2.54 cm  RIGHT VENTRICLE            IVC RV S prime:     9.48 cm/s  IVC diam: 2.50 cm TAPSE (M-mode): 1.5 cm LEFT ATRIUM             Index        RIGHT ATRIUM           Index LA diam:        4.70 cm 2.55 cm/m   RA Area:     16.80 cm LA Vol (A2C):   76.5 ml 41.47 ml/m  RA Volume:   41.60 ml  22.55 ml/m LA  Vol (A4C):   68.9 ml 37.35 ml/m LA Biplane Vol: 73.7 ml 39.95 ml/m  AORTIC VALVE LVOT Vmax:   119.00 cm/s LVOT Vmean:  82.400 cm/s LVOT VTI:    0.234 m  AORTA Ao Root diam: 3.00 cm Ao Asc diam:  3.10 cm MITRAL VALVE           TRICUSPID VALVE MV Area VTI: 1.69 cm  TR Peak grad:   21.0 mmHg MV VTI:      0.35 m    TR Vmax:        229.00 cm/s                         SHUNTS                        Systemic VTI:  0.23 m                        Systemic Diam: 1.80 cm Jenkins Rouge MD Electronically signed by Jenkins Rouge MD Signature Date/Time: 06/06/2022/3:32:54 PM    Final    DG Chest 2 View  Result Date: 06/05/2022 CLINICAL DATA:  Cough.  Atrial fibrillation. EXAM: CHEST - 2 VIEW COMPARISON:  Chest radiographs and CTA 05/10/2021 FINDINGS: Sequelae of CABG are again identified. The cardiac silhouette is borderline enlarged. Aortic atherosclerosis and calcified bilateral breast implants are noted. There is a new moderately large region of airspace consolidation in the right lower lobe. Background hyperinflation, chronic interstitial coarsening, and biapical pleuroparenchymal scarring are noted. Prior cervical and lumbar spine fusion and right shoulder arthroplasty are noted. IMPRESSION: Right lower lobe pneumonia. Followup PA and lateral chest X-ray is recommended in 3-4 weeks following trial of antibiotic therapy to ensure resolution and exclude underlying malignancy. Electronically Signed   By: Logan Bores  M.D.   On: 06/05/2022 10:30     Discharge Instructions: [name], [name] were recently admitted to [facility] for Pneumonia and atrial fibrillation with elevated heart rate.   Continue taking your home medications with the following changes   1. Start taking  a. Cefdinir, last dose 6/19  b. Azithromycin, last dose 6/19   You should seek further medical care if develop worsening shortness of breath.   Follow-up with Dr. Harrington Challenger to talk about next steps with abnormal heart rhythm. We are so glad that you are feeling better.   Sincerely,  Christiana Fuchs, DO  Signed:  Daleen Bo. Sven Pinheiro, D.O.  Internal Medicine Resident, PGY-1 [facility]  Pager: [phone] 4:09 PM, 06/06/2022   **Please contact the on call pager after 5 pm and on weekends at [phone].**

## 2022-06-23 ENCOUNTER — Other Ambulatory Visit: Payer: Self-pay

## 2022-06-23 NOTE — Patient Outreach (Signed)
[facility]) Care Management  06/23/2022  [name] December 24, 1940 [id number]   Telephone call to patient for disease management follow up. No answer.  Unable to leave a message.  Plan: RN CM will attempt again later in the month.   [name], RN, MSN [facility] Care Management Care Management Coordinator Direct Line [phone] Toll Free: [phone]  Fax: [phone]

## 2022-06-24 DIAGNOSIS — J449 Chronic obstructive pulmonary disease, unspecified: Secondary | ICD-10-CM | POA: Diagnosis not present

## 2022-06-26 ENCOUNTER — Ambulatory Visit: Payer: Self-pay

## 2022-07-04 ENCOUNTER — Other Ambulatory Visit: Payer: Self-pay | Admitting: Physician Assistant

## 2022-07-07 ENCOUNTER — Other Ambulatory Visit (HOSPITAL_COMMUNITY): Payer: Self-pay | Admitting: Physician Assistant

## 2022-07-07 DIAGNOSIS — I6523 Occlusion and stenosis of bilateral carotid arteries: Secondary | ICD-10-CM

## 2022-07-07 MED ORDER — DAPAGLIFLOZIN PROPANEDIOL 10 MG PO TABS: 10.0000 mg | ORAL_TABLET | Freq: Every day | ORAL | 3 refills | Status: DC

## 2022-07-09 DIAGNOSIS — Z8711 Personal history of peptic ulcer disease: Secondary | ICD-10-CM | POA: Diagnosis not present

## 2022-07-09 DIAGNOSIS — K551 Chronic vascular disorders of intestine: Secondary | ICD-10-CM | POA: Diagnosis not present

## 2022-07-09 DIAGNOSIS — R1084 Generalized abdominal pain: Secondary | ICD-10-CM | POA: Diagnosis not present

## 2022-07-11 ENCOUNTER — Other Ambulatory Visit: Payer: Self-pay

## 2022-07-11 NOTE — Patient Instructions (Signed)
Patient Goals/Self-Care Activities: Heart Failure call office if I gain more than 2 pounds in one day or 5 pounds in one week track weight in diary use salt in moderation watch for swelling in feet, ankles and legs every day weigh myself daily follow rescue plan if symptoms flare-up

## 2022-07-11 NOTE — Patient Outreach (Signed)
[facility]) Care Management  07/11/2022  [name] [birth year] [id number]   Telephone call to patient for follow up. Patient reports she is doing well. No concerns.   Care Plan : RN Care Manager Plan of Care  Updates made by Judy Billings, RN since 07/11/2022 12:00 AM     Problem: [place] Disease Management and Care Coordination Needs of Heart Failure   Priority: High     Long-Range Goal: Development of Plan of Care for Management of Heart Failure   Start Date: 11/11/2021  Expected End Date: 12/20/2022  This Visit's Progress: On track  Recent Progress: On track  Priority: High  Note:   Current Barriers:  Chronic Disease Management support and education needs related to CHF   RNCM Clinical Goal(s):  Patient will verbalize basic understanding of  CHF disease process and self health management plan as evidenced by No acute Exacerbation of Heart Failure continue to work with RN Care Manager to address care management and care coordination needs related to  CHF as evidenced by adherence to CM Team Scheduled appointments through collaboration with RN Care manager, provider, and care team.   Interventions: Education and support related to heart failure Inter-disciplinary care team collaboration (see longitudinal plan of care) Evaluation of current treatment plan related to  self management and patient's adherence to plan as established by provider   Heart Failure Interventions:  (Status:  Goal on track:  Yes.) Long Term Goal Provided education on low sodium diet Discussed importance of daily weight and advised patient to weigh and record daily Discussed the importance of keeping all appointments with provider  02/13/22 Patient weight 158 lbs. Encouraged patient to continue great work.  Reiterated heart failure management.     Heart Failure Management Reiterated Please weight daily or as ordered by your doctor Report to your doctor weight gain of 2 -3  pounds in a day or 5 pounds in a week Limit salt intake Monitor for shortness of breath, swelling of feet, ankles or abdomen and weight gain. Never use the saltshaker.  Read all food labels and avoid canned, processed, and pickled foods.   Follow your doctor's recommendations for daily salt intake  04/22/22 Patient reports doing good.  Weight 160 lbs.  Denies swelling or increased shortness of breath.  Reinforced importance of heart failure management.    07/11/22 Patient reports doing good.  Weight 155 lbs Heart Failure Management continues.  Patient to schedule for colonoscopy due to some stomach issues.    Patient Goals/Self-Care Activities: Heart Failure call office if I gain more than 2 pounds in one day or 5 pounds in one week track weight in diary use salt in moderation watch for swelling in feet, ankles and legs every day weigh myself daily follow rescue plan if symptoms flare-up  Follow Up Plan:  Telephone follow up appointment with care management team member scheduled for:  August The patient has been provided with contact information for the care management team and has been advised to call with any health related questions or concerns.     Plan: Follow-up: Patient agrees to Care Plan and Follow-up. Follow-up in August.  Kaylen Nghiem J Holly Iannaccone, RN, MSN [place] [facility] Care Management Coordinator Direct Line [phone] Toll Free: [phone]  Fax: [phone]

## 2022-07-14 ENCOUNTER — Ambulatory Visit (HOSPITAL_COMMUNITY)
Admission: RE | Admit: 2022-07-14 | Discharge: 2022-07-14 | Disposition: A | Discharge status: Home / Self Care | Payer: Medicare HMO | Source: Ambulatory Visit | Attending: Cardiology | Admitting: Cardiology

## 2022-07-14 DIAGNOSIS — I6523 Occlusion and stenosis of bilateral carotid arteries: Secondary | ICD-10-CM | POA: Diagnosis not present

## 2022-07-16 ENCOUNTER — Telehealth: Payer: Self-pay | Admitting: *Deleted

## 2022-07-16 DIAGNOSIS — I6523 Occlusion and stenosis of bilateral carotid arteries: Secondary | ICD-10-CM

## 2022-07-16 NOTE — Telephone Encounter (Signed)
-----   Message from Imogene Burn, [place] sent at 07/15/2022  3:18 PM EDT ----- Carotid dopplers stable 40-59% bilateral and left subclavian stenosis similar to last year. Repeat carotid dopplers in 1 yr under [employer], thanks

## 2022-07-17 DIAGNOSIS — K551 Chronic vascular disorders of intestine: Secondary | ICD-10-CM | POA: Diagnosis not present

## 2022-07-17 DIAGNOSIS — I700 Atherosclerosis of aorta: Secondary | ICD-10-CM | POA: Diagnosis not present

## 2022-07-17 DIAGNOSIS — R1084 Generalized abdominal pain: Secondary | ICD-10-CM | POA: Diagnosis not present

## 2022-07-23 DIAGNOSIS — R1084 Generalized abdominal pain: Secondary | ICD-10-CM | POA: Diagnosis not present

## 2022-07-25 DIAGNOSIS — J449 Chronic obstructive pulmonary disease, unspecified: Secondary | ICD-10-CM | POA: Diagnosis not present

## 2022-07-28 DIAGNOSIS — N189 Chronic kidney disease, unspecified: Secondary | ICD-10-CM | POA: Diagnosis not present

## 2022-08-08 ENCOUNTER — Other Ambulatory Visit: Payer: Self-pay

## 2022-08-08 NOTE — Patient Outreach (Signed)
[facility]) Care Management  08/08/2022  [name] June 01, 1941 [id number]   Telephone call to patient for follow up and case close. Paitent doing well.  Discussed case closure as patient meeting goals.  No concerns.    Care Plan : RN Care Manager Plan of Care  Updates made by Jon Billings, RN since 08/08/2022 12:00 AM  Completed 08/08/2022   Problem: [place] Disease Management and Care Coordination Needs of Heart Failure Resolved 08/08/2022  Priority: High     Long-Range Goal: Development of Plan of Care for Management of Heart Failure Completed 08/08/2022  Start Date: 11/11/2021  Expected End Date: 12/20/2022  Recent Progress: On track  Priority: High  Note:   Current Barriers:  Chronic Disease Management support and education needs related to CHF   RNCM Clinical Goal(s):  Patient will verbalize basic understanding of  CHF disease process and self health management plan as evidenced by No acute Exacerbation of Heart Failure continue to work with RN Care Manager to address care management and care coordination needs related to  CHF as evidenced by adherence to CM Team Scheduled appointments through collaboration with RN Care manager, provider, and care team.   Interventions: Education and support related to heart failure Inter-disciplinary care team collaboration (see longitudinal plan of care) Evaluation of current treatment plan related to  self management and patient's adherence to plan as established by provider   Heart Failure Interventions:  (Status:  Goal on track:  Yes.) Long Term Goal Provided education on low sodium diet Discussed importance of daily weight and advised patient to weigh and record daily Discussed the importance of keeping all appointments with provider  02/13/22 Patient weight 158 lbs. Encouraged patient to continue great work.  Reiterated heart failure management.     Heart Failure Management Reiterated Please weight daily  or as ordered by your doctor Report to your doctor weight gain of 2 -3 pounds in a day or 5 pounds in a week Limit salt intake Monitor for shortness of breath, swelling of feet, ankles or abdomen and weight gain. Never use the saltshaker.  Read all food labels and avoid canned, processed, and pickled foods.   Follow your doctor's recommendations for daily salt intake  04/22/22 Patient reports doing good.  Weight 160 lbs.  Denies swelling or increased shortness of breath.  Reinforced importance of heart failure management.    07/11/22 Patient reports doing good.  Weight 155 lbs Heart Failure Management continues.  Patient to schedule for colonoscopy due to some stomach issues.    Patient Goals/Self-Care Activities: Heart Failure call office if I gain more than 2 pounds in one day or 5 pounds in one week track weight in diary use salt in moderation watch for swelling in feet, ankles and legs every day weigh myself daily follow rescue plan if symptoms flare-up  Follow Up Plan:  Patient meeting goals.  Will close case.    Plan: Closing case.   [name], RN, MSN [facility] Care Management Care Management Coordinator Direct Line [phone] Toll Free: [phone]  Fax: [phone]

## 2022-08-12 DIAGNOSIS — F411 Generalized anxiety disorder: Secondary | ICD-10-CM | POA: Diagnosis not present

## 2022-08-12 DIAGNOSIS — G894 Chronic pain syndrome: Secondary | ICD-10-CM | POA: Diagnosis not present

## 2022-08-12 DIAGNOSIS — I10 Essential (primary) hypertension: Secondary | ICD-10-CM | POA: Diagnosis not present

## 2022-08-12 DIAGNOSIS — D649 Anemia, unspecified: Secondary | ICD-10-CM | POA: Diagnosis not present

## 2022-08-12 DIAGNOSIS — F332 Major depressive disorder, recurrent severe without psychotic features: Secondary | ICD-10-CM | POA: Diagnosis not present

## 2022-08-13 ENCOUNTER — Telehealth: Payer: Self-pay

## 2022-08-13 NOTE — Telephone Encounter (Signed)
  Per [facility], patient has been called several times and a letter was sent asking her to call and schedule an appt.  No response from patient.  Referral has been closed.   Message sent to Dr Harrington Challenger and her PCP.

## 2022-08-14 DIAGNOSIS — K581 Irritable bowel syndrome with constipation: Secondary | ICD-10-CM | POA: Diagnosis not present

## 2022-08-14 DIAGNOSIS — R1032 Left lower quadrant pain: Secondary | ICD-10-CM | POA: Diagnosis not present

## 2022-08-25 DIAGNOSIS — J449 Chronic obstructive pulmonary disease, unspecified: Secondary | ICD-10-CM | POA: Diagnosis not present

## 2022-09-08 ENCOUNTER — Other Ambulatory Visit: Payer: Self-pay | Admitting: Internal Medicine

## 2022-09-08 ENCOUNTER — Telehealth: Payer: Self-pay

## 2022-09-08 NOTE — Telephone Encounter (Signed)
Pts pharmacy sent RX request.. in reviewing her chart,.. the pt has not followed up since her June 2023 hosp stay... will add her in for Dr Harrington Challenger... 09/23/22 at 11:40 am... will try to keep calling the pt but her cell number is invalid and home number keeps ringing... her granddaughter advised me that she may be on the phone an cannot click over with her call waiting....she will tell her to call us when she sees her.

## 2022-09-16 DIAGNOSIS — K581 Irritable bowel syndrome with constipation: Secondary | ICD-10-CM | POA: Diagnosis not present

## 2022-09-23 ENCOUNTER — Ambulatory Visit: Payer: Medicare HMO | Attending: Internal Medicine | Admitting: Internal Medicine

## 2022-09-23 ENCOUNTER — Ambulatory Visit (INDEPENDENT_AMBULATORY_CARE_PROVIDER_SITE_OTHER): Payer: Medicare HMO

## 2022-09-23 VITALS — BP 132/68 | HR 92 | Ht 68.0 in | Wt 153.0 lb

## 2022-09-23 DIAGNOSIS — M79604 Pain in right leg: Secondary | ICD-10-CM

## 2022-09-23 DIAGNOSIS — I4891 Unspecified atrial fibrillation: Secondary | ICD-10-CM | POA: Diagnosis not present

## 2022-09-23 NOTE — Progress Notes (Unsigned)
Enrolled for [employer] to mail a ZIO XT long term holter monitor to the patients address on file.  

## 2022-09-23 NOTE — Progress Notes (Signed)
Cardiology Office Note   Date:  09/23/2022   ID:  [name], [place] 12-12-1941, MRN [id number]  PCP:  [name], MD  Cardiologist:   Dorris Carnes, MD   Pt presents for f/u of CAD    History of Present Illness: [name] is a 81 y.o. female with a history of CAD (s/p CABG 2004; DES to LM in 2010; myovue in 2018:  Low risk with distal anterior and apical ischemia), chronic diastolic CHF, CV dz, PAD recurrent DVT/PE, GI bleeding  when on anticoagulation (not on),  COPD, PAF (after CABG), GERD, HL, HTN, renal insufficiency, sleep apnea (not on CPAP)    Over the years she has been admitted for pneumonia, COPD exacerbations and decompensated HFpEF  I aw the pt in July   At that time she was feeling very bad   Weak.  Mild productive cough with brownish sputum    In afib at that time     I sent her to ER   Admitted   Found to have a RLL pneumonia.  The pt was treated with ABx   Improved    Reported to revert to SR      Since she says she is doing much better   Energy is back to  baseline Deneis palpitations    No dizziness       Current Meds  Medication Sig   acetaminophen (TYLENOL) 500 MG tablet 1000 mg by oral route.   albuterol (PROVENTIL) (2.5 MG/3ML) 0.083% nebulizer solution Take 2.5 mg by nebulization every 6 (six) hours as needed for wheezing or shortness of breath.   allopurinol (ZYLOPRIM) 100 MG tablet Take 100 mg by mouth daily with breakfast.   amLODipine (NORVASC) 2.5 MG tablet Take 1/2 tablet as needed for BP greater than 160.   ARIPiprazole (ABILIFY) 2 MG tablet Take 2 mg by mouth daily with breakfast.   aspirin EC 81 MG tablet Take 81 mg by mouth daily with breakfast. Swallow whole.   atorvastatin (LIPITOR) 20 MG tablet Take 20 mg by mouth daily with supper.   cholecalciferol (VITAMIN D3) 25 MCG (1000 UNIT) tablet Take 1,000 Units by mouth daily with supper.   citalopram (CELEXA) 10 MG tablet Take 10 mg by mouth daily.   cyclobenzaprine (FLEXERIL) 10  MG tablet Take 10 mg by mouth 3 (three) times daily as needed for muscle spasms.   dapagliflozin propanediol (FARXIGA) 10 MG TABS tablet Take 1 tablet (10 mg total) by mouth daily before breakfast.   Docusate Sodium (DSS) 100 MG CAPS Take 100 mg by mouth daily as needed (constipation).   Fluticasone-Umeclidin-Vilant (TRELEGY ELLIPTA) 200-62.5-25 MCG/INH AEPB Inhale 1 puff into the lungs daily.   furosemide (LASIX) 80 MG tablet Take 1 tablet (80 mg total) by mouth daily. (Patient taking differently: Take 80 mg by mouth every other day.)   gabapentin (NEURONTIN) 100 MG capsule Take 200 mg by mouth at bedtime.   Ipratropium-Albuterol (COMBIVENT RESPIMAT) 20-100 MCG/ACT AERS respimat Inhale 1 puff into the lungs 3 (three) times daily as needed for wheezing or shortness of breath.   levothyroxine (SYNTHROID) 50 MCG tablet Take 1 tablet (50 mcg total) by mouth daily before breakfast.   magnesium gluconate (MAGONATE) 500 MG tablet Take 500 mg by mouth daily.   Melatonin 10 MG TABS Take 30 mg by mouth at bedtime.   metolazone (ZAROXOLYN) 2.5 MG tablet Take 1 tablet (2.5 mg total) by mouth once a week. Take 30 minutes before Lasix (  furosemide).   nitroGLYCERIN (NITROSTAT) 0.4 MG SL tablet Place 1 tablet (0.4 mg total) under the tongue every 5 (five) minutes as needed for chest pain. For chest pain   OXYGEN Inhale 4 L/min into the lungs as needed (DURING ALL TIMES OF EXERTION).    pantoprazole (PROTONIX) 40 MG tablet Take 40 mg by mouth daily.   pilocarpine (PILOCAR) 1 % ophthalmic solution Place 1 drop into both eyes 2 (two) times daily.   polyethylene glycol (MIRALAX / GLYCOLAX) 17 g packet Take 17 g by mouth as needed.   potassium chloride SA (KLOR-CON) 20 MEQ tablet Take 20 mEq by mouth daily with lunch.   pramipexole (MIRAPEX) 0.25 MG tablet Take 0.25 mg by mouth daily with supper.   spironolactone (ALDACTONE) 25 MG tablet Take 1 tablet (25 mg total) by mouth daily.   traZODone (DESYREL) 50 MG tablet  Take 150 mg by mouth at bedtime.    vitamin C (ASCORBIC ACID) 500 MG tablet Take 500 mg by mouth daily with lunch.   XTAMPZA ER 9 MG C12A Take 9 mg by mouth 2 (two) times daily.     Allergies:   Ativan [lorazepam], Pitavastatin, Ropinirole, Zofran [ondansetron hcl], Zolpidem tartrate, and Penicillins   Past Medical History:  Diagnosis Date   Anemia    bld. transfusion post lumbar surgery- 2012   Anxiety    Arthralgia    NOS   Blood transfusion 2012; 02/2018   WITH BACK SURGERY; "related to hematoma in stomach" (10/06/2018)   CAD (coronary artery disease)    s/p CABG 2004; s/p DES to LM in 2010;  [place] 10/29/11: EF 50-55%, mild elevated filling pressures, no pulmonary HTN, LM 90% ISR, LAD and CFX occluded, S-RI occluded (old), S-OM3 ok and L-LAD ok, native nondominant RCA 95% -  med rx recommended ; Lexiscan Myoview 7/13 at [facility]: demonstrated "normal LV function, anterior attenuation and localized ischemia, inferior, basilar, mid section"   Carotid artery disease ([place])    Carotid US 0/93:  RICA 2-35; LICA 57-32; R subclavian stenosis - Repeat 1 year. // Carotid US 05/2019: R 1-39; L 40-59; R vertebral with atypical antegrade flow; R subclavian stenosis >> repeat 1 year // Carotid US 7/21: Bilat 40-59; L subclavian stenosis // Carotid US 7/22: Bilat ICA 40-59; R subclavian stenosis   CHF (congestive heart failure) ([place]) 01/21/2020   Chronic diastolic heart failure (HCC)    Echo 9/10: EF 20-25%, grade 1 diastolic dysfunction   Chronic lower back pain    COPD (chronic obstructive pulmonary disease) ([place])    Emphysema dxed by Dr. Woody Seller in [place] based on PFTs per pt in 2006; placed on albuterol   Depression    TAKES CELEXA  AND  (OFF- WELBUTRIN)   Diuretic-induced hypokalemia 10/08/2018   DVT of lower extremity (deep venous thrombosis) (HCC)    recurrent. bilateral (2 episodes)   Dyspnea    home o2 when needed   Dysrhythmia    afib with cabg   Exertional angina (HCC)    Treated  with Isosorbide, Ranexa, amlodipine; intolerant to metoprolol   GERD (gastroesophageal reflux disease)    Gout    "on daily RX" (10/06/2018)   HLD (hyperlipidemia)    Hypertension    Hyperthyroidism    L Subclavian Artery Stenosis    Carotid US 7/21    Obesity (BMI 30-39.9) 2009   BMI 33   Osteoarthritis    "all over; hands" (10/06/2018)   Oxygen deficiency    Oxygen dependent  4L at home as needed (10/06/2018)   Pneumonia    PVD (peripheral vascular disease) ([place])    Sleep apnea 2012   USED CPAP THEN  STARTED USING SPIRIVA , Nov. 2013- last evaluation , changed from mask to aparatus that is just for her nose.. ; reports 05-28-18 "the mask smothers me so i dont use it right now" (10/06/2018)    Past Surgical History:  Procedure Laterality Date   ANKLE SURGERY  2004   ANTERIOR CERVICAL DECOMP/DISCECTOMY FUSION  11/2007   [place]   lower; another scheduled, opt. reports 4 back- lumbar, 3 cerv. fusions  for later 2009   CARDIAC CATHETERIZATION  11/2009   Patent LIMA to LAD and patent SVG to OM1. Occluded SVG to ramus and diagonal. Left main: 90% ostial stenosis, LCX 60-70% proximal stenosis   CATARACT EXTRACTION W/ INTRAOCULAR LENS  IMPLANT, BILATERAL Bilateral 2006   COLONOSCOPY WITH PROPOFOL N/A 02/17/2017   Procedure: COLONOSCOPY WITH PROPOFOL;  Surgeon: Jerene Bears, MD;  Location: [facility] ENDOSCOPY;  Service: Endoscopy;  Laterality: N/A;   CORONARY ANGIOPLASTY WITH STENT PLACEMENT  11/2009   Drug eluting stent to left main artery: 4.0 X 12 mm Ion    CORONARY ARTERY BYPASS GRAFT  2004   "CABG X4"   ESOPHAGOGASTRODUODENOSCOPY N/A 02/14/2017   Procedure: ESOPHAGOGASTRODUODENOSCOPY (EGD);  Surgeon: Doran Stabler, MD;  Location: [facility] ENDOSCOPY;  Service: Endoscopy;  Laterality: N/A;   GIVENS CAPSULE STUDY N/A 02/15/2017   Procedure: GIVENS CAPSULE STUDY;  Surgeon: Doran Stabler, MD;  Location: [place];  Service: Endoscopy;  Laterality:  N/A;   GROIN MASS OPEN BIOPSY  2004   IR IVC FILTER PLMT / S&I /IMG GUID/MOD SED  03/14/2018   IR PARACENTESIS  03/10/2018   JOINT REPLACEMENT     LAPAROSCOPIC CHOLECYSTECTOMY     PERIPHERAL VASCULAR INTERVENTION Left 03/05/2018   Procedure: PERIPHERAL VASCULAR INTERVENTION;  Surgeon: Elam Dutch, MD;  Location: [place] CV LAB;  Service: Cardiovascular;  Laterality: Left;  Attempted unsuccess\ful Per Dr. Eden Lathe   PERIPHERAL VASCULAR INTERVENTION  03/08/2018   Procedure: PERIPHERAL VASCULAR INTERVENTION;  Surgeon: Waynetta Sandy, MD;  Location: [place] [facility];  Service: Cardiovascular;;  SMA Stent    REPLACEMENT TOTAL KNEE BILATERAL Bilateral 2012   REVERSE SHOULDER ARTHROPLASTY  02/26/2012   Procedure: REVERSE SHOULDER ARTHROPLASTY;  Surgeon: Marin Shutter, MD;  Location: [place];  Service: Orthopedics;  Laterality: Right;  RIGHT SHOULDER REVERSED ARTHROPLASTY   SHOULDER ARTHROSCOPY WITH SUBACROMIAL DECOMPRESSION Left 02/10/2013   Procedure: SHOULDER ARTHROSCOPY WITH SUBACROMIAL DECOMPRESSION DISTAL CLAVICLE RESECTION;  Surgeon: Marin Shutter, MD;  Location: [place];  Service: Orthopedics;  Laterality: Left;  DISTAL CLAVICLE RESECTION   TONSILLECTOMY     TOTAL HIP ARTHROPLASTY  2007   Right   TOTAL HIP ARTHROPLASTY Left 02/07/2020   Procedure: TOTAL HIP ARTHROPLASTY ANTERIOR APPROACH;  Surgeon: Paralee Cancel, MD;  Location: [facility];  Service: Orthopedics;  Laterality: Left;  70 mins   TRANSURETHRAL RESECTION OF BLADDER TUMOR N/A 05/31/2018   Procedure: TRANSURETHRAL RESECTION OF BLADDER TUMOR (TURBT);  Surgeon: Lucas Mallow, MD;  Location: [facility];  Service: Urology;  Laterality: N/A;   VISCERAL ANGIOGRAPHY N/A 03/05/2018   Procedure: VISCERAL ANGIOGRAPHY;  Surgeon: Elam Dutch, MD;  Location: [place] [facility];  Service: Cardiovascular;  Laterality: N/A;   VISCERAL ANGIOGRAPHY N/A 03/08/2018   Procedure: VISCERAL ANGIOGRAPHY;  Surgeon: Donzetta Matters,  Georgia Dom, MD;   Location: [place] [facility];  Service: Cardiovascular;  Laterality: N/A;     Social History:  The patient  reports that she quit smoking about 23 years ago. Her smoking use included cigarettes. She has a 45.00 pack-year smoking history. She has never used smokeless tobacco. She reports that she does not drink alcohol and does not use drugs.   Family History:  The patient's family history includes COPD in her father; Colitis in her sister; Heart disease in her father and sister; Lung cancer in her father; Ovarian cancer in her mother; Stomach cancer in her sister.    ROS:  Please see the history of present illness. All other systems are reviewed and  Negative to the above problem except as noted.    PHYSICAL EXAM: VS:  BP 132/68   Pulse 92   Ht '5\' 8"'$  (1.727 m)   Wt 153 lb (69.4 kg)   SpO2 94%   BMI 23.26 kg/m   GEN: Well nourished, in no acute distress  HEENT: normal  Neck: no obvious JVD, no carotid bruits, Cardiac: RRR     Gr Ii/VI systolic murmur (early peaking ) at base  [place] edema.  Respiratory:  clear to auscultation bilaterally GI: soft, nontender, nondistended, + BS  No hepatomegaly   MS: Moving all extremities   Skin: warm and dry, no rash Neuro:  Strength and sensation are intact Psych: euthymic mood,    EKG:  EKG is not done today     Lipid Panel    Component Value Date/Time   CHOL 104 02/24/2022 0912   CHOL 98 05/23/2014 0857   TRIG 45 02/24/2022 0912   TRIG 83 05/23/2014 0857   HDL 50 02/24/2022 0912   HDL 35 (L) 05/23/2014 0857   CHOLHDL 2.1 02/24/2022 0912   CHOLHDL 1.7 05/10/2021 2305   VLDL 4 05/10/2021 2305   LDLCALC 43 02/24/2022 0912   LDLCALC 46 05/23/2014 0857      Wt Readings from Last 3 Encounters:  09/23/22 153 lb (69.4 kg)  06/05/22 157 lb 3 oz (71.3 kg)  06/05/22 155 lb 6.4 oz (70.5 kg)      ASSESSMENT AND PLAN:   1  Fatigue  Pt is doing much better than when I saw her earlier this summer    Admitted at time and treated for  pneumonia      2  PAF   Pt with epiosde of afib in settingof pneumonia.   One prior episode after CABG.    Clnically in SR  She denies palpitations  Hx of GI bleeding   with AVMs  Will set up for an monitor to wear to evaluate    3  Hx HFpEF   Volume is appears OK now    2  CAD  Hx of CABG 2004  DES to LM in 2010   Myoview in 2018  Low risk No CP  Continue  ASA and statin  3  Hx DVT   not on anticoag due  to GI bleeding   s/p IVC filter   4HTN   BP is good   5  HL  Excellent control    LDL 43  HDL 52  Trig 70     6  CKD   Cr 1.39 in July   7  CV dz   Pt had carotid USN in July  Mod CV dz has been stable     8   GI bleeding  Deneis     Last Hgb 12 in July         Will  follow up in clnic this winter   Current medicines are reviewed at length with the patient today.  The patient does not have concerns regarding medicines.  Signed, Dorris Carnes, MD  09/23/2022 9:21 AM    [address], [place], [place]  [address] Phone: [phone]; Fax: [phone]

## 2022-09-23 NOTE — Patient Instructions (Signed)
Medication Instructions:   *If you need a refill on your cardiac medications before your next appointment, please call your pharmacy*   Lab Work:  If you have labs (blood work) drawn today and your tests are completely normal, you will receive your results only by: [place] (if you have MyChart) OR A paper copy in the mail If you have any lab test that is abnormal or we need to change your treatment, we will call you to review the results.   Testing/Procedures: [place] [place]- Long Term Monitor Instructions  Your physician has requested you wear a ZIO patch monitor for 14 days.  This is a single patch monitor. [employer] supplies one patch monitor per enrollment. Additional stickers are not available. Please do not apply patch if you will be having a Nuclear Stress Test,  Echocardiogram, Cardiac CT, MRI, or Chest Xray during the period you would be wearing the  monitor. The patch cannot be worn during these tests. You cannot remove and re-apply the  ZIO XT patch monitor.  Your ZIO patch monitor will be mailed 3 day USPS to your address on file. It may take 3-5 days  to receive your monitor after you have been enrolled.  Once you have received your monitor, please review the enclosed instructions. Your monitor  has already been registered assigning a specific monitor serial # to you.  Billing and Patient Assistance Program Information  We have supplied [employer] with any of your insurance information on file for billing purposes. [employer] offers a sliding scale Patient Assistance Program for patients that do not have  insurance, or whose insurance does not completely cover the cost of the ZIO monitor.  You must apply for the Patient Assistance Program to qualify for this discounted rate.  To apply, please call [employer] at [phone], select option 4, select option 2, ask to apply for  Patient Assistance Program. Theodore Demark will ask your household income, and how many people  are in your  household. They will quote your out-of-pocket cost based on that information.  [employer] will also be able to set up a 58-month interest-free payment plan if needed.  Applying the monitor   Shave hair from upper left chest.  Hold abrader disc by orange tab. Rub abrader in 40 strokes over the upper left chest as  indicated in your monitor instructions.  Clean area with 4 enclosed alcohol pads. Let dry.  Apply patch as indicated in monitor instructions. Patch will be placed under collarbone on left  side of chest with arrow pointing upward.  Rub patch adhesive wings for 2 minutes. Remove white label marked "1". Remove the white  label marked "2". Rub patch adhesive wings for 2 additional minutes.  While looking in a mirror, press and release button in center of patch. A small green light will  flash 3-4 times. This will be your only indicator that the monitor has been turned on.  Do not shower for the first 24 hours. You may shower after the first 24 hours.  Press the button if you feel a symptom. You will hear a small click. Record Date, Time and  Symptom in the Patient Logbook.  When you are ready to remove the patch, follow instructions on the last 2 pages of Patient  Logbook. Stick patch monitor onto the last page of Patient Logbook.  Place Patient Logbook in the blue and white box. Use locking tab on box and tape box closed  securely. The blue and white box has prepaid  postage on it. Please place it in the mailbox as  soon as possible. Your physician should have your test results approximately 7 days after the  monitor has been mailed back to [facility].  Call [facility] at [phone] if you have questions regarding  your ZIO XT patch monitor. Call them immediately if you see an orange light blinking on your  monitor.  If your monitor falls off in less than 4 days, contact our Monitor department at [phone].  If your monitor becomes loose or falls off after  4 days call [employer] at [phone] for  suggestions on securing your monitor    Follow-Up: At [facility], you and your health needs are our priority.  As part of our continuing mission to provide you with exceptional heart care, we have created designated Provider Care Teams.  These Care Teams include your primary Cardiologist (physician) and Advanced Practice Providers (APPs -  Physician Assistants and Nurse Practitioners) who all work together to provide you with the care you need, when you need it.  We recommend signing up for the patient portal called "MyChart".  Sign up information is provided on this After Visit Summary.  MyChart is used to connect with patients for Virtual Visits (Telemedicine).  Patients are able to view lab/test results, encounter notes, upcoming appointments, etc.  Non-urgent messages can be sent to your provider as well.   To learn more about what you can do with MyChart, go to [email or web address].    Your next appointment:   5 month(s)  The format for your next appointment:   In Person  Provider:   Dorris Carnes, MD     Other Instructions   Important Information About Sugar

## 2022-09-26 DIAGNOSIS — I4891 Unspecified atrial fibrillation: Secondary | ICD-10-CM | POA: Diagnosis not present

## 2022-09-30 ENCOUNTER — Ambulatory Visit (INDEPENDENT_AMBULATORY_CARE_PROVIDER_SITE_OTHER): Payer: Medicare HMO | Admitting: Sports Medicine

## 2022-09-30 ENCOUNTER — Ambulatory Visit: Payer: Self-pay

## 2022-09-30 ENCOUNTER — Ambulatory Visit (INDEPENDENT_AMBULATORY_CARE_PROVIDER_SITE_OTHER): Payer: Medicare HMO

## 2022-09-30 ENCOUNTER — Encounter: Payer: Self-pay | Admitting: Sports Medicine

## 2022-09-30 VITALS — BP 156/81 | HR 102 | Ht 67.0 in | Wt 151.0 lb

## 2022-09-30 DIAGNOSIS — G8929 Other chronic pain: Secondary | ICD-10-CM

## 2022-09-30 DIAGNOSIS — M25561 Pain in right knee: Secondary | ICD-10-CM

## 2022-09-30 DIAGNOSIS — Z96653 Presence of artificial knee joint, bilateral: Secondary | ICD-10-CM

## 2022-09-30 NOTE — Progress Notes (Signed)
[name] - 81 y.o. female MRN [id number]  Date of birth: Aug 05, 1941  Office Visit Note: Visit Date: 09/30/2022 PCP: Judy Roger, MD Referred by: Fay Records, MD  Subjective: Chief Complaint  Patient presents with   Right Knee - Pain   HPI: [name] is a pleasant 81 y.o. female who presents today for chronic right knee pain.   Presents with chronic right knee pain for the last 3-4 years, however worsening over the last 6 months.  She is on oxycodone for pain but this does not help much.  She underwent bilateral knee replacement surgery in 2007, has had right knee pain since the end of 2019.  She did have a nuclear med three-phase bone scan on 01/20/2019 which demonstrated abnormal uptake and findings consistent with superior nondisplaced patella fracture.  She states she did not have any further surgical treatment for this.  Reports pain over the lateral aspect of the kneecap and knee.  Does report it feels warm and has for many months.  Denies any significant swelling.  It has been keeping her up at night.  Ice does not help her pain.  Pertinent ROS were reviewed with the patient and found to be negative unless otherwise specified above in HPI.   Assessment & Plan: Visit Diagnoses:  1. Chronic pain of right knee   2. History of total knee arthroplasty, bilateral    Plan: I reviewed her x-rays and ultrasound with [name] today.  Radiographs do not show any abnormality of the hardware or lucency that would suggest micromotion.  She does have some mild hyperemia on ultrasound around the knee joint and patellar region, but no significant effusion.  She has had pain ever since 2020 when she had a three-phase bone scan which showed a nondisplaced superior patella fracture.  We will order repeat nuclear med three-phase bone scan for the lower extremity.  I will have her follow-up with my partner, Dr. Ninfa Linden, to discuss next steps and further evaluation.  According to  the patient, she has never had an aspiration of the knee since the TKA.  Recommended ice and continuing Tylenol, and her normal oxycodone dosing for pain control.   Follow-up: Return for follow-up in 2-3 weeks with Dr. Ninfa Linden (after bone scan completed).   Meds & Orders: No orders of the defined types were placed in this encounter.   Orders Placed This Encounter  Procedures   XR KNEE 3 VIEW RIGHT   [place] Extrem Low Right Ltd   NM Bone Scan 3 Phase Lower Extremity     Procedures: No procedures performed      Clinical History: No specialty comments available.  She reports that she quit smoking about 23 years ago. Her smoking use included cigarettes. She has a 45.00 pack-year smoking history. She has never used smokeless tobacco. No results for input(s): "HGBA1C", "LABURIC" in the last 8760 hours.  Objective:   Vital Signs: BP (!) 156/81   Pulse (!) 102   Ht '5\' 7"'$  (1.702 m)   Wt 151 lb (68.5 kg)   BMI 23.65 kg/m   Physical Exam  Gen: Well-appearing, in no acute distress; non-toxic CV: Regular Rate. Well-perfused. Warm.  Resp: Breathing unlabored on room air; no wheezing. Psych: Fluid speech in conversation; appropriate affect; normal thought process Neuro: Sensation intact throughout. No gross coordination deficits.   Ortho Exam -Right knee: There is some tenderness to palpation over the lateral aspect of the patella.  No joint line TTP.  Maybe trace effusion and mild warmth without erythema or ecchymosis.  Range of motion preserved from 0-125 degrees.  There is an intact extensor mechanism.  No palpable Baker's cyst.  Walks with the use of a cane.  Equivocal strength with knee flexion and extension compared to contralateral knee.  Imaging: [place] Extrem Low Right Ltd  Result Date: 09/30/2022 [facility] Limited knee ultrasound performed, right Evaluation of the quadricep tendon shows an intact tendon with some hyperemia noted at the distal insertion over the patella.  Scanning in long and  short axis does show some calcification within the distal quadricep tendon just proximal to the superior patella. There is hyperemia noted around this and the lateral aspect of the patella without acute cortical irregularity.  Lateral joint line appears intact.  There is evidence of knee arthroplasty present.  There is a very small effusion more so on the lateral joint line.  XR KNEE 3 VIEW RIGHT  Result Date: 09/30/2022 3 views of the right knee including AP, lateral and sunrise views were ordered and reviewed by myself.  X-rays demonstrate prior knee TKA.  I cannot appreciate any lucency around the hardware, it does appear in a good fixed position.  There is some bony callus of the superior aspect of the patella, likely from a prior chronic superior patellar fracture.  There is evidence of bony callus within the distal end of the quadricep tendon.  No acute fracture noted.  No hardware malalignment or abnormality on x-ray.       NM Bone Scan on 01/20/22 was independently reviewed by myself.  CLINICAL DATA:  BILATERAL knee BILATERAL knee replacement surgery in 2007, RIGHT knee pain since 12/13/2018, no trauma, question mechanical loosening of RIGHT knee prosthesis   EXAM: NUCLEAR MEDICINE 3-PHASE BONE SCAN   TECHNIQUE: Radionuclide angiographic images, immediate static blood pool images, and 3-hour delayed static images were obtained of the knees after intravenous injection of radiopharmaceutical.   RADIOPHARMACEUTICALS:  20.7 mCi Tc-31mMDP IV   COMPARISON:  None   Radiographic correlation: BILATERAL knee radiographs 12/23/2018   FINDINGS: Vascular phase: Mildly increased blood flow to the anterior RIGHT knee above the joint.   Blood pool phase: Increased blood pool anterior RIGHT knee at the level of the patella extending slightly cranially   Delayed phase: Photopenic defects at both knees from knee prostheses. Abnormal increased tracer localization in the RIGHT patella. No  additional abnormal tracer localization is seen adjacent to prosthetic components in either knee, nor within the visualized femora or proximal lower legs.   IMPRESSION: Increased blood flow, blood pool and delayed uptake of tracer at the RIGHT patella.   Upon review of the prior radiographs from 12/23/2018, there appears to be a nondisplaced fracture of the RIGHT superior patella.   This would account for the scintigraphic findings identified on the current bone scan.    Past Medical/Family/Surgical/Social History: Medications & Allergies reviewed per EMR, new medications updated. Patient Active Problem List   Diagnosis Date Noted   Chronic pain 05/11/2021   Acute exacerbation of congestive heart failure ([facility] 05/11/2021   Chest pain 05/10/2021   COPD exacerbation ([facility] 04/29/2021   Prolonged QT interval 04/29/2021   Depression 04/29/2021   RLS (restless legs syndrome) 04/29/2021   CAD (coronary artery disease) of bypass graft 02/07/2021   CHF (congestive heart failure) ([facility] 02/06/2021   CHF (congestive heart failure), NYHA class III, acute, diastolic ([facility] 028/36/6294  Normocytic anemia 10/28/2020   Acute respiratory failure with hypoxia ([facility] 10/09/2020  Hypotension 10/09/2020   CAP (community acquired pneumonia) 10/09/2020   L Subclavian Artery Stenosis    S/P hip replacement, left 02/07/2020   History of pulmonary embolism 10/04/2019   Dyspnea 12/24/2018   Cellulitis 12/24/2018   Otitis media 12/24/2018   Diuretic-induced hypokalemia 10/08/2018   Acute on chronic diastolic heart failure ([place]) 10/06/2018   Hematuria 04/01/2018   Acute lower UTI 04/01/2018   Hematoma of abdominal wall, subsequent encounter 04/01/2018   Chronic diastolic heart failure ([place]) 03/29/2018   GERD without esophagitis 03/29/2018   Chronic gout 03/29/2018   Chronic bilateral low back pain without sciatica 03/29/2018   Colitis 03/10/2018   AKI (acute kidney injury) ([place])    Ascites     Pulmonary embolism ([place]) 03/01/2018   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[place]) 03/01/2018   Acute on chronic respiratory failure with hypoxia ([place]) 03/01/2018   Angiodysplasia of intestine    Hematochezia    Malnutrition of moderate degree 02/15/2017   Blood loss anemia 02/13/2017   Impingement syndrome of left shoulder 02/10/2013   PAD (peripheral artery disease) ([place]) 11/17/2011   Dyslipidemia 10/17/2011   Coronary artery disease involving native coronary artery of native heart with angina pectoris ([place])    Hypothyroidism 05/03/2008   Essential hypertension 05/03/2008   Sleep apnea 05/03/2008   COPD (chronic obstructive pulmonary disease) ([place]) 04/12/2008   Past Medical History:  Diagnosis Date   Anemia    bld. transfusion post lumbar surgery- 2012   Anxiety    Arthralgia    NOS   Blood transfusion 2012; 02/2018   WITH BACK SURGERY; "related to hematoma in stomach" (10/06/2018)   CAD (coronary artery disease)    s/p CABG 2004; s/p DES to LM in 2010;  [place] 10/29/11: EF 50-55%, mild elevated filling pressures, no pulmonary HTN, LM 90% ISR, LAD and CFX occluded, S-RI occluded (old), S-OM3 ok and L-LAD ok, native nondominant RCA 95% -  med rx recommended ; Lexiscan Myoview 7/13 at [facility]: demonstrated "normal LV function, anterior attenuation and localized ischemia, inferior, basilar, mid section"   Carotid artery disease ([place])    Carotid US 7/82:  RICA 9-56; LICA 21-30; R subclavian stenosis - Repeat 1 year. // Carotid US 05/2019: R 1-39; L 40-59; R vertebral with atypical antegrade flow; R subclavian stenosis >> repeat 1 year // Carotid US 7/21: Bilat 40-59; L subclavian stenosis // Carotid US 7/22: Bilat ICA 40-59; R subclavian stenosis   CHF (congestive heart failure) ([place]) 01/21/2020   Chronic diastolic heart failure (HCC)    Echo 9/10: EF 86-57%, grade 1 diastolic dysfunction   Chronic lower back pain    COPD (chronic obstructive pulmonary disease) ([place])    Emphysema dxed by Dr. Woody Seller  in [place] based on PFTs per pt in 2006; placed on albuterol   Depression    TAKES CELEXA  AND  (OFF- WELBUTRIN)   Diuretic-induced hypokalemia 10/08/2018   DVT of lower extremity (deep venous thrombosis) (HCC)    recurrent. bilateral (2 episodes)   Dyspnea    home o2 when needed   Dysrhythmia    afib with cabg   Exertional angina    Treated with Isosorbide, Ranexa, amlodipine; intolerant to metoprolol   GERD (gastroesophageal reflux disease)    Gout    "on daily RX" (10/06/2018)   HLD (hyperlipidemia)    Hypertension    Hyperthyroidism    L Subclavian Artery Stenosis    Carotid US 7/21    Obesity (BMI 30-39.9) 2009   BMI 33  Osteoarthritis    "all over; hands" (10/06/2018)   Oxygen deficiency    Oxygen dependent    4L at home as needed (10/06/2018)   Pneumonia    PVD (peripheral vascular disease) ([place])    Sleep apnea 2012   USED CPAP THEN  STARTED USING SPIRIVA , Nov. 2013- last evaluation , changed from mask to aparatus that is just for her nose.. ; reports 05-28-18 "the mask smothers me so i dont use it right now" (10/06/2018)   Family History  Problem Relation Age of Onset   Ovarian cancer Mother    Lung cancer Father    COPD Father    Heart disease Father    Heart disease Sister    Stomach cancer Sister    Colitis Sister    Past Surgical History:  Procedure Laterality Date   ANKLE SURGERY  2004   ANTERIOR CERVICAL DECOMP/DISCECTOMY FUSION  11/2007   [place]   lower; another scheduled, opt. reports 4 back- lumbar, 3 cerv. fusions  for later 2009   CARDIAC CATHETERIZATION  11/2009   Patent LIMA to LAD and patent SVG to OM1. Occluded SVG to ramus and diagonal. Left main: 90% ostial stenosis, LCX 60-70% proximal stenosis   CATARACT EXTRACTION W/ INTRAOCULAR LENS  IMPLANT, BILATERAL Bilateral 2006   COLONOSCOPY WITH PROPOFOL N/A 02/17/2017   Procedure: COLONOSCOPY WITH PROPOFOL;  Surgeon: Jerene Bears, MD;  Location: [facility]  ENDOSCOPY;  Service: Endoscopy;  Laterality: N/A;   CORONARY ANGIOPLASTY WITH STENT PLACEMENT  11/2009   Drug eluting stent to left main artery: 4.0 X 12 mm Ion    CORONARY ARTERY BYPASS GRAFT  2004   "CABG X4"   ESOPHAGOGASTRODUODENOSCOPY N/A 02/14/2017   Procedure: ESOPHAGOGASTRODUODENOSCOPY (EGD);  Surgeon: Doran Stabler, MD;  Location: [facility] ENDOSCOPY;  Service: Endoscopy;  Laterality: N/A;   GIVENS CAPSULE STUDY N/A 02/15/2017   Procedure: GIVENS CAPSULE STUDY;  Surgeon: Doran Stabler, MD;  Location: [place];  Service: Endoscopy;  Laterality: N/A;   GROIN MASS OPEN BIOPSY  2004   IR IVC FILTER PLMT / S&I /IMG GUID/MOD SED  03/14/2018   IR PARACENTESIS  03/10/2018   JOINT REPLACEMENT     LAPAROSCOPIC CHOLECYSTECTOMY     PERIPHERAL VASCULAR INTERVENTION Left 03/05/2018   Procedure: PERIPHERAL VASCULAR INTERVENTION;  Surgeon: Elam Dutch, MD;  Location: [place] CV LAB;  Service: Cardiovascular;  Laterality: Left;  Attempted unsuccess\ful Per Dr. Eden Lathe   PERIPHERAL VASCULAR INTERVENTION  03/08/2018   Procedure: PERIPHERAL VASCULAR INTERVENTION;  Surgeon: Waynetta Sandy, MD;  Location: [place] CV LAB;  Service: Cardiovascular;;  SMA Stent    REPLACEMENT TOTAL KNEE BILATERAL Bilateral 2012   REVERSE SHOULDER ARTHROPLASTY  02/26/2012   Procedure: REVERSE SHOULDER ARTHROPLASTY;  Surgeon: Marin Shutter, MD;  Location: [place];  Service: Orthopedics;  Laterality: Right;  RIGHT SHOULDER REVERSED ARTHROPLASTY   SHOULDER ARTHROSCOPY WITH SUBACROMIAL DECOMPRESSION Left 02/10/2013   Procedure: SHOULDER ARTHROSCOPY WITH SUBACROMIAL DECOMPRESSION DISTAL CLAVICLE RESECTION;  Surgeon: Marin Shutter, MD;  Location: [place];  Service: Orthopedics;  Laterality: Left;  DISTAL CLAVICLE RESECTION   TONSILLECTOMY     TOTAL HIP ARTHROPLASTY  2007   Right   TOTAL HIP ARTHROPLASTY Left 02/07/2020   Procedure: TOTAL HIP ARTHROPLASTY ANTERIOR APPROACH;  Surgeon: Paralee Cancel, MD;  Location:  [facility];  Service: Orthopedics;  Laterality: Left;  70 mins   TRANSURETHRAL RESECTION OF BLADDER TUMOR N/A  05/31/2018   Procedure: TRANSURETHRAL RESECTION OF BLADDER TUMOR (TURBT);  Surgeon: Lucas Mallow, MD;  Location: [facility];  Service: Urology;  Laterality: N/A;   VISCERAL ANGIOGRAPHY N/A 03/05/2018   Procedure: VISCERAL ANGIOGRAPHY;  Surgeon: Elam Dutch, MD;  Location: [place] [facility];  Service: Cardiovascular;  Laterality: N/A;   VISCERAL ANGIOGRAPHY N/A 03/08/2018   Procedure: VISCERAL ANGIOGRAPHY;  Surgeon: Waynetta Sandy, MD;  Location: [place] [facility];  Service: Cardiovascular;  Laterality: N/A;   Social History   Occupational History   Occupation: retired    Comment: Electronics engineer: RETIRED  Tobacco Use   Smoking status: Former    Packs/day: 1.00    Years: 45.00    Total pack years: 45.00    Types: Cigarettes    Quit date: 12/22/1998    Years since quitting: 23.7   Smokeless tobacco: Never  Vaping Use   Vaping Use: Never used  Substance and Sexual Activity   Alcohol use: Never    Alcohol/week: 0.0 standard drinks of alcohol   Drug use: Never   Sexual activity: Not Currently

## 2022-09-30 NOTE — Progress Notes (Unsigned)
[name] - 81 y.o. female MRN [id number]  Date of birth: 27-Nov-1941  Office Visit Note: Visit Date: 09/30/2022 PCP: [name], MD Referred by: Elba Barman, DO  Subjective: No chief complaint on file.  HPI: [name] is a pleasant 81 y.o. female who presents today for chronic right knee pain.  Pertinent ROS were reviewed with the patient and found to be negative unless otherwise specified above in HPI.   Assessment & Plan: Visit Diagnoses: No diagnosis found.  Plan: ***  Follow-up: Return in about 2 weeks (around 10/14/2022) for schedule with Dr. Ninfa Linden in next 2-3 weeks (after bonescan).   Meds & Orders: No orders of the defined types were placed in this encounter.  No orders of the defined types were placed in this encounter.    Procedures: No procedures performed      Clinical History: No specialty comments available.  She reports that she quit smoking about 23 years ago. Her smoking use included cigarettes. She has a 45.00 pack-year smoking history. She has never used smokeless tobacco. No results for input(s): "HGBA1C", "LABURIC" in the last 8760 hours.  Objective:   Vital Signs: There were no vitals taken for this visit.  Physical Exam  Gen: Well-appearing, in no acute distress; non-toxic CV: Regular Rate. Well-perfused. Warm.  Resp: Breathing unlabored on room air; no wheezing. Psych: Fluid speech in conversation; appropriate affect; normal thought process Neuro: Sensation intact throughout. No gross coordination deficits.   Ortho Exam - ***  Imaging: No results found.  Past Medical/Family/Surgical/Social History: Medications & Allergies reviewed per EMR, new medications updated. Patient Active Problem List   Diagnosis Date Noted   Chronic pain 05/11/2021   Acute exacerbation of congestive heart failure ([place]) 05/11/2021   Chest pain 05/10/2021   COPD exacerbation ([place]) 04/29/2021   Prolonged QT interval 04/29/2021    Depression 04/29/2021   RLS (restless legs syndrome) 04/29/2021   CAD (coronary artery disease) of bypass graft 02/07/2021   CHF (congestive heart failure) ([place]) 02/06/2021   CHF (congestive heart failure), NYHA class III, acute, diastolic ([place]) 28/41/3244   Normocytic anemia 10/28/2020   Acute respiratory failure with hypoxia ([place]) 10/09/2020   Hypotension 10/09/2020   CAP (community acquired pneumonia) 10/09/2020   L Subclavian Artery Stenosis    S/P hip replacement, left 02/07/2020   History of pulmonary embolism 10/04/2019   Dyspnea 12/24/2018   Cellulitis 12/24/2018   Otitis media 12/24/2018   Diuretic-induced hypokalemia 10/08/2018   Acute on chronic diastolic heart failure ([place]) 10/06/2018   Hematuria 04/01/2018   Acute lower UTI 04/01/2018   Hematoma of abdominal wall, subsequent encounter 04/01/2018   Chronic diastolic heart failure ([place]) 03/29/2018   GERD without esophagitis 03/29/2018   Chronic gout 03/29/2018   Chronic bilateral low back pain without sciatica 03/29/2018   Colitis 03/10/2018   AKI (acute kidney injury) ([place])    Ascites    Pulmonary embolism ([place]) 03/01/2018   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[place]) 03/01/2018   Acute on chronic respiratory failure with hypoxia ([place]) 03/01/2018   Angiodysplasia of intestine    Hematochezia    Malnutrition of moderate degree 02/15/2017   Blood loss anemia 02/13/2017   Impingement syndrome of left shoulder 02/10/2013   PAD (peripheral artery disease) ([place]) 11/17/2011   Dyslipidemia 10/17/2011   Coronary artery disease involving native coronary artery of native heart with angina pectoris ([place])    Hypothyroidism 05/03/2008   Essential hypertension 05/03/2008   Sleep apnea 05/03/2008   COPD (  chronic obstructive pulmonary disease) ([place]) 04/12/2008   Past Medical History:  Diagnosis Date   Anemia    bld. transfusion post lumbar surgery- 2012   Anxiety    Arthralgia    NOS   Blood transfusion 2012; 02/2018   WITH BACK  SURGERY; "related to hematoma in stomach" (10/06/2018)   CAD (coronary artery disease)    s/p CABG 2004; s/p DES to LM in 2010;  [place] 10/29/11: EF 50-55%, mild elevated filling pressures, no pulmonary HTN, LM 90% ISR, LAD and CFX occluded, S-RI occluded (old), S-OM3 ok and L-LAD ok, native nondominant RCA 95% -  med rx recommended ; Lexiscan Myoview 7/13 at [facility]: demonstrated "normal LV function, anterior attenuation and localized ischemia, inferior, basilar, mid section"   Carotid artery disease ([place])    Carotid US 0/86:  RICA 5-78; LICA 46-96; R subclavian stenosis - Repeat 1 year. // Carotid US 05/2019: R 1-39; L 40-59; R vertebral with atypical antegrade flow; R subclavian stenosis >> repeat 1 year // Carotid US 7/21: Bilat 40-59; L subclavian stenosis // Carotid US 7/22: Bilat ICA 40-59; R subclavian stenosis   CHF (congestive heart failure) ([place]) 01/21/2020   Chronic diastolic heart failure (HCC)    Echo 9/10: EF 29-52%, grade 1 diastolic dysfunction   Chronic lower back pain    COPD (chronic obstructive pulmonary disease) ([place])    Emphysema dxed by Dr. Woody Seller in [place] based on PFTs per pt in 2006; placed on albuterol   Depression    TAKES CELEXA  AND  (OFF- WELBUTRIN)   Diuretic-induced hypokalemia 10/08/2018   DVT of lower extremity (deep venous thrombosis) (HCC)    recurrent. bilateral (2 episodes)   Dyspnea    home o2 when needed   Dysrhythmia    afib with cabg   Exertional angina (HCC)    Treated with Isosorbide, Ranexa, amlodipine; intolerant to metoprolol   GERD (gastroesophageal reflux disease)    Gout    "on daily RX" (10/06/2018)   HLD (hyperlipidemia)    Hypertension    Hyperthyroidism    L Subclavian Artery Stenosis    Carotid US 7/21    Obesity (BMI 30-39.9) 2009   BMI 33   Osteoarthritis    "all over; hands" (10/06/2018)   Oxygen deficiency    Oxygen dependent    4L at home as needed (10/06/2018)   Pneumonia    PVD (peripheral vascular disease) ([place])     Sleep apnea 2012   USED CPAP THEN  STARTED USING SPIRIVA , Nov. 2013- last evaluation , changed from mask to aparatus that is just for her nose.. ; reports 05-28-18 "the mask smothers me so i dont use it right now" (10/06/2018)   Family History  Problem Relation Age of Onset   Ovarian cancer Mother    Lung cancer Father    COPD Father    Heart disease Father    Heart disease Sister    Stomach cancer Sister    Colitis Sister    Past Surgical History:  Procedure Laterality Date   ANKLE SURGERY  2004   ANTERIOR CERVICAL DECOMP/DISCECTOMY FUSION  11/2007   [place]   lower; another scheduled, opt. reports 4 back- lumbar, 3 cerv. fusions  for later 2009   CARDIAC CATHETERIZATION  11/2009   Patent LIMA to LAD and patent SVG to OM1. Occluded SVG to ramus and diagonal. Left main: 90% ostial stenosis, LCX 60-70% proximal stenosis  CATARACT EXTRACTION W/ INTRAOCULAR LENS  IMPLANT, BILATERAL Bilateral 2006   COLONOSCOPY WITH PROPOFOL N/A 02/17/2017   Procedure: COLONOSCOPY WITH PROPOFOL;  Surgeon: Jerene Bears, MD;  Location[facility] ENDOSCOPY;  Service: Endoscopy;  Laterality: N/A;   CORONARY ANGIOPLASTY WITH STENT PLACEMENT  11/2009   Drug eluting stent to left main artery: 4.0 X 12 mm Ion    CORONARY ARTERY BYPASS GRAFT  2004   "CABG X4"   ESOPHAGOGASTRODUODENOSCOPY N/A 02/14/2017   Procedure: ESOPHAGOGASTRODUODENOSCOPY (EGD);  Surgeon: Doran Stabler, MD;  Location[facility] ENDOSCOPY;  Service: Endoscopy;  Laterality: N/A;   GIVENS CAPSULE STUDY N/A 02/15/2017   Procedure: GIVENS CAPSULE STUDY;  Surgeon: Doran Stabler, MD;  Location: [place];  Service: Endoscopy;  Laterality: N/A;   GROIN MASS OPEN BIOPSY  2004   IR IVC FILTER PLMT / S&I /IMG GUID/MOD SED  03/14/2018   IR PARACENTESIS  03/10/2018   JOINT REPLACEMENT     LAPAROSCOPIC CHOLECYSTECTOMY     PERIPHERAL VASCULAR INTERVENTION Left 03/05/2018   Procedure: PERIPHERAL VASCULAR INTERVENTION;   Surgeon: Elam Dutch, MD;  Location: [place] CV LAB;  Service: Cardiovascular;  Laterality: Left;  Attempted unsuccess\ful Per Dr. Eden Lathe   PERIPHERAL VASCULAR INTERVENTION  03/08/2018   Procedure: PERIPHERAL VASCULAR INTERVENTION;  Surgeon: Waynetta Sandy, MD;  Location: [place] CV LAB;  Service: Cardiovascular;;  SMA Stent    REPLACEMENT TOTAL KNEE BILATERAL Bilateral 2012   REVERSE SHOULDER ARTHROPLASTY  02/26/2012   Procedure: REVERSE SHOULDER ARTHROPLASTY;  Surgeon: Marin Shutter, MD;  Location: [place];  Service: Orthopedics;  Laterality: Right;  RIGHT SHOULDER REVERSED ARTHROPLASTY   SHOULDER ARTHROSCOPY WITH SUBACROMIAL DECOMPRESSION Left 02/10/2013   Procedure: SHOULDER ARTHROSCOPY WITH SUBACROMIAL DECOMPRESSION DISTAL CLAVICLE RESECTION;  Surgeon: Marin Shutter, MD;  Location: [place];  Service: Orthopedics;  Laterality: Left;  DISTAL CLAVICLE RESECTION   TONSILLECTOMY     TOTAL HIP ARTHROPLASTY  2007   Right   TOTAL HIP ARTHROPLASTY Left 02/07/2020   Procedure: TOTAL HIP ARTHROPLASTY ANTERIOR APPROACH;  Surgeon: Paralee Cancel, MD;  Location: [facility];  Service: Orthopedics;  Laterality: Left;  70 mins   TRANSURETHRAL RESECTION OF BLADDER TUMOR N/A 05/31/2018   Procedure: TRANSURETHRAL RESECTION OF BLADDER TUMOR (TURBT);  Surgeon: Lucas Mallow, MD;  Location: [facility];  Service: Urology;  Laterality: N/A;   VISCERAL ANGIOGRAPHY N/A 03/05/2018   Procedure: VISCERAL ANGIOGRAPHY;  Surgeon: Elam Dutch, MD;  Location: [place] CV LAB;  Service: Cardiovascular;  Laterality: N/A;   VISCERAL ANGIOGRAPHY N/A 03/08/2018   Procedure: VISCERAL ANGIOGRAPHY;  Surgeon: Waynetta Sandy, MD;  Location: [place] CV LAB;  Service: Cardiovascular;  Laterality: N/A;   Social History   Occupational History   Occupation: retired    Comment: Electronics engineer: RETIRED  Tobacco Use   Smoking status: Former    Packs/day: 1.00    Years: 45.00    Total pack  years: 45.00    Types: Cigarettes    Quit date: 12/22/1998    Years since quitting: 23.7   Smokeless tobacco: Never  Vaping Use   Vaping Use: Never used  Substance and Sexual Activity   Alcohol use: Never    Alcohol/week: 0.0 standard drinks of alcohol   Drug use: Never   Sexual activity: Not Currently

## 2022-09-30 NOTE — Progress Notes (Signed)
No injury; surgery 3 years ago with Dr. Criss Alvine like it is "broke" Taking oxycodone for pain; helps minimal  Pain keeps her up at night; feels like it is getting worse  Feels like it has a "fever" in it Notices a slight bump over the knee that the other does not

## 2022-10-08 ENCOUNTER — Other Ambulatory Visit: Payer: Self-pay | Admitting: Internal Medicine

## 2022-10-08 DIAGNOSIS — Z23 Encounter for immunization: Secondary | ICD-10-CM | POA: Diagnosis not present

## 2022-10-08 DIAGNOSIS — F331 Major depressive disorder, recurrent, moderate: Secondary | ICD-10-CM | POA: Diagnosis not present

## 2022-10-08 DIAGNOSIS — F411 Generalized anxiety disorder: Secondary | ICD-10-CM | POA: Diagnosis not present

## 2022-10-08 NOTE — Telephone Encounter (Signed)
Pt's pharmacy is requesting a refill on levothyroxine. Would Dr. Ross like to refill this medication? Please address °

## 2022-10-10 ENCOUNTER — Encounter (HOSPITAL_COMMUNITY)
Admission: RE | Admit: 2022-10-10 | Discharge: 2022-10-10 | Disposition: A | Discharge status: Home / Self Care | Payer: Medicare HMO | Source: Ambulatory Visit | Attending: Sports Medicine | Admitting: Sports Medicine

## 2022-10-10 DIAGNOSIS — G8929 Other chronic pain: Secondary | ICD-10-CM | POA: Insufficient documentation

## 2022-10-10 DIAGNOSIS — M25561 Pain in right knee: Secondary | ICD-10-CM | POA: Insufficient documentation

## 2022-10-10 DIAGNOSIS — R948 Abnormal results of function studies of other organs and systems: Secondary | ICD-10-CM | POA: Diagnosis not present

## 2022-10-10 MED ORDER — TECHNETIUM TC 99M MEDRONATE IV KIT
20.0000 | PACK | Freq: Once | INTRAVENOUS | Status: AC | PRN
  Administered 2022-10-10: 20.9 via INTRAVENOUS

## 2022-10-15 ENCOUNTER — Ambulatory Visit: Payer: Medicare HMO | Admitting: Orthopaedic Surgery

## 2022-10-15 ENCOUNTER — Encounter: Payer: Self-pay | Admitting: Orthopaedic Surgery

## 2022-10-15 DIAGNOSIS — Z96653 Presence of artificial knee joint, bilateral: Secondary | ICD-10-CM

## 2022-10-15 DIAGNOSIS — M25561 Pain in right knee: Secondary | ICD-10-CM | POA: Diagnosis not present

## 2022-10-15 DIAGNOSIS — G8929 Other chronic pain: Secondary | ICD-10-CM

## 2022-10-15 DIAGNOSIS — T8484XD Pain due to internal orthopedic prosthetic devices, implants and grafts, subsequent encounter: Secondary | ICD-10-CM

## 2022-10-15 DIAGNOSIS — Z96651 Presence of right artificial knee joint: Secondary | ICD-10-CM | POA: Diagnosis not present

## 2022-10-15 DIAGNOSIS — I4891 Unspecified atrial fibrillation: Secondary | ICD-10-CM | POA: Diagnosis not present

## 2022-10-15 MED ORDER — METHYLPREDNISOLONE ACETATE 40 MG/ML IJ SUSP
40.0000 mg | INTRAMUSCULAR | Status: AC | PRN
  Administered 2022-10-15: 40 mg via INTRA_ARTICULAR

## 2022-10-15 MED ORDER — LIDOCAINE HCL 1 % IJ SOLN
3.0000 mL | INTRAMUSCULAR | Status: AC | PRN
  Administered 2022-10-15: 3 mL

## 2022-10-15 NOTE — Progress Notes (Signed)
The patient is a pleasant 81 year old female sent to me from my partner Dr. Elba Barman to evaluate and treat a painful right total knee arthroplasty.  She does ambulate with a cane and her daughter is with her who feels like she should use a walker more because of her balance issues.  She had both of her knees replaced I believe at the same time in 2007.  This was done by one of my colleagues in town.  She has been having pain for over a year now with her right knee has been slowly getting worse and she points to the patella and lateral to the patella source of her pain.  Dr. Rolena Infante took a look at her knee under ultrasound and three-phase bone scan was ordered and it does show uptake around the patella.  I did go over her x-rays with her and previous films from 2020 compared to more recent films do show calcification in the proximal femur suggesting something is broken off from the patella itself.  On exam there is no knee joint effusion and her extensor mechanism is intact.  Her pain is only to palpation over the lateral aspect of patella.  Even the sunrise view show a little bit of cortical irregularity in that aspect of the knee.  She has multiple medical issues including congestive heart failure.  She is on a blood thinner.  She has COPD as well.  We need to really try to treat her more conservative based on these medical issues.  I gave her reassurance that this is not infectious process.  The main components of her knee look good.  I do feel that she would mobilize better using her cane in her opposite hand on the left side and even continuing a walker.  She should wear more supportive shoes as well.  I did place a steroid injection around the lateral aspect of her patella and some of the knee joint.  She got immediate relief from the lidocaine portion of the injection.  All question concerns were answered addressed.  I would like to see her back in 2 weeks for repeat exam.      Procedure  Note  Patient: [name]             Date of Birth: [birth year]           MRN: [id number]             Visit Date: 10/15/2022  Procedures: Visit Diagnoses:  1. Chronic pain of right knee   2. History of total knee arthroplasty, bilateral   3. Pain due to total right knee replacement, subsequent encounter     Large Joint Inj on 10/15/2022 10:31 AM Indications: diagnostic evaluation and pain Details: 22 G 1.5 in needle, superolateral approach  Arthrogram: No  Medications: 3 mL lidocaine 1 %; 40 mg methylPREDNISolone acetate 40 MG/ML Outcome: tolerated well, no immediate complications Procedure, treatment alternatives, risks and benefits explained, specific risks discussed. Consent was given by the patient. Immediately prior to procedure a time out was called to verify the correct patient, procedure, equipment, support staff and site/side marked as required. Patient was prepped and draped in the usual sterile fashion.

## 2022-11-03 ENCOUNTER — Encounter: Payer: Self-pay | Admitting: Orthopaedic Surgery

## 2022-11-03 ENCOUNTER — Ambulatory Visit (INDEPENDENT_AMBULATORY_CARE_PROVIDER_SITE_OTHER): Payer: Medicare HMO | Admitting: Orthopaedic Surgery

## 2022-11-03 DIAGNOSIS — G8929 Other chronic pain: Secondary | ICD-10-CM

## 2022-11-03 DIAGNOSIS — M25561 Pain in right knee: Secondary | ICD-10-CM | POA: Diagnosis not present

## 2022-11-03 NOTE — Progress Notes (Signed)
The patient comes in today for continued follow-up for her right knee.  She has remote history of a knee replacement done by one of my colleagues in town and she did sustain some type of patella injury.  We did place a steroid injection in her right knee joint at her last visit and she said that helped her greatly.  The pain is not gone but she feels better overall.  She is 81 years old.  She does ambulate with a rolling walker.  On exam there is no knee joint effusion.  Her patella tracks centrally.  Her extensor mechanism is entirely intact.  The knee does not feel unstable and is not painful to her.  There is no hypermobility of the patella.  It is good that she is feeling better overall.  I recommended at least considering a copper fit knee sleeve.  I would not recommend repeat steroid injections in a prosthetic knee because infection risk.  We can certainly inject it in 6 months if needed but I would hold off on that.  I would not recommend surgery the right now but being the fact that she is doing better overall.  Follow-up can be as needed since she is doing well.  All question concerns were answered and addressed.

## 2022-11-07 ENCOUNTER — Other Ambulatory Visit: Payer: Self-pay | Admitting: Internal Medicine

## 2022-11-07 ENCOUNTER — Ambulatory Visit (INDEPENDENT_AMBULATORY_CARE_PROVIDER_SITE_OTHER): Payer: Medicare HMO | Admitting: Internal Medicine

## 2022-11-07 ENCOUNTER — Encounter: Payer: Self-pay | Admitting: Internal Medicine

## 2022-11-07 VITALS — BP 130/70 | HR 103 | Temp 97.9°F | Resp 16 | Ht 65.0 in | Wt 156.0 lb

## 2022-11-07 DIAGNOSIS — G894 Chronic pain syndrome: Secondary | ICD-10-CM

## 2022-11-07 MED ORDER — XTAMPZA ER 9 MG PO C12A: 1.0000 | EXTENDED_RELEASE_CAPSULE | Freq: Two times a day (BID) | ORAL | 0 refills | Status: DC

## 2022-11-07 MED ORDER — XTAMPZA ER 9 MG PO C12A: 9.0000 mg | EXTENDED_RELEASE_CAPSULE | Freq: Two times a day (BID) | ORAL | 0 refills | Status: DC

## 2022-11-07 NOTE — Progress Notes (Signed)
Office Visit  Subjective   Patient ID: [name]   DOB: 23-Feb-1941   Age: 81 y.o.   MRN: [id number]   Chief Complaint Chief Complaint  Patient presents with   Follow-up     History of Present Illness [name] is a 81 yo female who  returns today for followup of her chronic pain syndrome.  She has chronic pain due to low back pain as well as pain from her history of bilateral knee replacements and bilateral shoulders.  She did see ortho for chronic right knee prosthetic pain where they did a steriod injection in 10/15/2022.  She saw Dr. Ninfa Linden on 11/03/2022 where he recommended a copper fit knee sleeve but would not recommend a repeat steriod injection in a prosthetic knee due to risk of infection.  He does not recommend surgery right now but will see her back in 6 months.  Over the interim, she states there has been no change to her pain and its about the same.  This past year, we did switch her oxycodone IR over to [facility] ER '9mg'$  po TID.  The Ginger Organ is controlling her pain.  Her combination of acute pain as well as weakness and fatigue (which I felt was due to a combo of recovering from COVID-19 and depression as described below) has improved greatly with followup this past year.  Due to her acute pain as described , I spoke to Dr. Humphrey Rolls in Pain Management around 08/2021 where we decided to hold her Xtampza for a few weeks and put her on short acting oxycodone '5mg'$  po q 6 hrs prn.  I did restart her on celexa and her depression improved at that time.  [name] surgery on 01/2020.  She is currently on Xtampza ER '9mg'$  po TID which does help control her pain.  She is also on gabapentin '200mg'$  BID for pain as well. Her chronic pain effects her neck, shoulders, lower back, hips and her knees.  She has seen Dr. Humphrey Rolls in pain management who recommended placing her on oxycodone ER '10mg'$  po BID.  The patient could not afford this so we switched her to  oxycodone '10mg'$  po q 6 hr prn which she does take 4 times per day.  She states she could not function with her day to day activites without her pain medications.  She has been on MS contin and oxycodone ER in the past.  She tells me that this medication gives her fatigue and all she wants to do is sit down and do nothing and is not able to do alot of her ADL's.  She was previously on percocet 10/'325mg'$  po q 6hr prn (which she took 4 times per day) before the MS contin and was on this for years.  She was on long term benzos in the past but again we stopped her off of these.  The patient has seen seen Dr. Judeen Hammans (orthopedics) in [place] and Dr. Saintclair Halsted (neurosurgery).  She remains on cyclobenzaprine as a muscle relaxant.  The patient had had left shoulder surgery in 2014 due to impingement syndrome and a chronic rotator cuff tear and labral tear where they performed a debridement and synvectomy.  This did not correct her shoulder but did reduce the amount of pain she has had in that arm/shoulder.  Her back pain has remained the same.  She also has chronic back pain as well as pain from her history of bilateral knee  replacements and bilateral shoulders.   She continues to have pain in both of her knees when she gets up.  She is still using a cane and no longer uses a walker for longer distances.  The patient denies any falls.  She does use a cane to get around and has [place].  In regards to her chronic lower back pain, her pain mostly affects her when she stands up and is unchanged from her last visit.  The pain is an intermittent dull aching she rates as severe at times but again improves with sitting and medications.  There is no pain radiation.  She had back surgery 2012 where her neurosurgeon noted she had worsening back pain with progressive break down of L1-L2 above the levels of her L2-L5 fusion in the past.  They did do surgery and removed some of her hardware.  She has tried PT and pool therapy which she states  did not help.  Her pain medicine allows her to do her ADL's including cleaning her house and shopping.  The patient was tried on cymbalta in the past but this made her having nausea and vomiting.  There is no loss of bowel/bladder function or new weakness or numbness.  Her last dose of Xtampza was this morning.         Past Medical History Past Medical History:  Diagnosis Date   Anemia    bld. transfusion post lumbar surgery- 2012   Anxiety    Arthralgia    NOS   Blood transfusion 2012; 02/2018   WITH BACK SURGERY; "related to hematoma in stomach" (10/06/2018)   CAD (coronary artery disease)    s/p CABG 2004; s/p DES to LM in 2010;  [place] 10/29/11: EF 50-55%, mild elevated filling pressures, no pulmonary HTN, LM 90% ISR, LAD and CFX occluded, S-RI occluded (old), S-OM3 ok and L-LAD ok, native nondominant RCA 95% -  med rx recommended ; Lexiscan Myoview 7/13 [facility]: demonstrated "normal LV function, anterior attenuation and localized ischemia, inferior, basilar, mid section"   Carotid artery disease ([place])    Carotid US 1/51:  RICA 7-61; LICA 60-73; R subclavian stenosis - Repeat 1 year. // Carotid US 05/2019: R 1-39; L 40-59; R vertebral with atypical antegrade flow; R subclavian stenosis >> repeat 1 year // Carotid US 7/21: Bilat 40-59; L subclavian stenosis // Carotid US 7/22: Bilat ICA 40-59; R subclavian stenosis   CHF (congestive heart failure) ([place]) 01/21/2020   Chronic diastolic heart failure (HCC)    Echo 9/10: EF 71-06%, grade 1 diastolic dysfunction   Chronic lower back pain    COPD (chronic obstructive pulmonary disease) ([place])    Emphysema dxed by Dr. Woody Seller in [place] based on PFTs per pt in 2006; placed on albuterol   Depression    TAKES CELEXA  AND  (OFF- WELBUTRIN)   Diuretic-induced hypokalemia 10/08/2018   DVT of lower extremity (deep venous thrombosis) (HCC)    recurrent. bilateral (2 episodes)   Dyspnea    home o2 when needed   Dysrhythmia    afib with cabg    Exertional angina    Treated with Isosorbide, Ranexa, amlodipine; intolerant to metoprolol   GERD (gastroesophageal reflux disease)    Gout    "on daily RX" (10/06/2018)   HLD (hyperlipidemia)    Hypertension    Hyperthyroidism    L Subclavian Artery Stenosis    Carotid US 7/21    Obesity (BMI 30-39.9) 2009   BMI 33  Osteoarthritis    "all over; hands" (10/06/2018)   Oxygen deficiency    Oxygen dependent    4L at home as needed (10/06/2018)   Pneumonia    PVD (peripheral vascular disease) ([place])    Sleep apnea 2012   USED CPAP THEN  STARTED USING SPIRIVA , Nov. 2013- last evaluation , changed from mask to aparatus that is just for her nose.. ; reports 05-28-18 "the mask smothers me so i dont use it right now" (10/06/2018)     Allergies Allergies  Allergen Reactions   Ativan [Lorazepam] Other (See Comments)    Confusion    Pitavastatin Itching    Reaction to Livalo   Ropinirole Nausea Only and Other (See Comments)    Makes legs jump, also   Zofran [Ondansetron Hcl] Nausea Only   Zolpidem Tartrate Anxiety and Other (See Comments)    CONFUSION    Penicillins Rash and Other (See Comments)    Tolerated cephalosporins in past Has patient had a PCN reaction causing immediate rash, facial/tongue/throat swelling, SOB or lightheadedness with hypotension: Yes Has patient had a PCN reaction causing severe rash involving mucus membranes or skin necrosis: No Has patient had a PCN reaction that required hospitalization: No Has patient had a PCN reaction occurring within the last 10 years: No If all of the above answers are "NO", then may proceed with Cephalosporin use.      Review of Systems Review of Systems  Constitutional:  Negative for chills, fever, malaise/fatigue and weight loss.  Respiratory:  Negative for cough and shortness of breath.   Cardiovascular:  Negative for chest pain and palpitations.  Gastrointestinal:  Negative for abdominal pain, constipation, diarrhea, nausea  and vomiting.  Musculoskeletal:  Positive for back pain and joint pain. Negative for myalgias.  Skin:  Negative for itching and rash.  Neurological:  Negative for dizziness, weakness and headaches.  Psychiatric/Behavioral:  Negative for depression.        Objective:    Vitals BP 130/70 (BP Location: Right Arm, Patient Position: Sitting, Cuff Size: Normal)   Pulse (!) 103   Temp 97.9 F (36.6 C) (Temporal)   Resp 16   Ht '5\' 5"'$  (1.651 m)   Wt 156 lb 0.4 oz (70.8 kg)   SpO2 97%   BMI 25.96 kg/m    Physical Examination Physical Exam Constitutional:      General: She is not in acute distress.    Appearance: Normal appearance. She is not ill-appearing.  Cardiovascular:     Rate and Rhythm: Normal rate and regular rhythm.     Pulses: Normal pulses.     Heart sounds: Normal heart sounds. No murmur heard.    No friction rub. No gallop.  Pulmonary:     Effort: Pulmonary effort is normal. No respiratory distress.     Breath sounds: Normal breath sounds. No wheezing, rhonchi or rales.  Abdominal:     General: Abdomen is flat. Bowel sounds are normal. There is no distension.     Palpations: Abdomen is soft.     Tenderness: There is no abdominal tenderness.  Musculoskeletal:     Right lower leg: No edema.     Left lower leg: No edema.  Skin:    General: Skin is warm and dry.     Findings: No rash.  Neurological:     Mental Status: She is alert.        Assessment & Plan:   Chronic pain I reviewed her [place] controlled substance registry.  Her pain is about the same as last visit.  We will write her for 3 months of medications.  I reviewed orthos note and she will followup as directed.    No follow-ups on file.   [name], MD

## 2022-11-07 NOTE — Assessment & Plan Note (Addendum)
I reviewed her [place] controlled substance registry.  Her pain is about the same as last visit.  We will write her for 3 months of medications.  I reviewed orthos note and she will followup as directed.

## 2022-11-10 DIAGNOSIS — R109 Unspecified abdominal pain: Secondary | ICD-10-CM | POA: Diagnosis not present

## 2022-11-11 ENCOUNTER — Other Ambulatory Visit: Payer: Self-pay

## 2022-11-11 MED ORDER — ALLOPURINOL 100 MG PO TABS: 100.0000 mg | ORAL_TABLET | Freq: Every day | ORAL | 1 refills | Status: DC

## 2022-11-11 MED ORDER — VITAMIN D 25 MCG (1000 UNIT) PO TABS: 1000.0000 [IU] | ORAL_TABLET | Freq: Every day | ORAL | 1 refills | Status: DC

## 2022-11-12 ENCOUNTER — Other Ambulatory Visit: Payer: Self-pay | Admitting: Internal Medicine

## 2022-11-12 MED ORDER — XTAMPZA ER 9 MG PO C12A: 1.0000 | EXTENDED_RELEASE_CAPSULE | Freq: Three times a day (TID) | ORAL | 0 refills | Status: DC

## 2022-11-12 MED ORDER — XTAMPZA ER 9 MG PO C12A: 9.0000 mg | EXTENDED_RELEASE_CAPSULE | Freq: Three times a day (TID) | ORAL | 0 refills | Status: AC

## 2022-11-25 ENCOUNTER — Telehealth: Payer: Self-pay

## 2022-11-25 NOTE — Patient Outreach (Signed)
  Care Coordination   11/25/2022 Name: [name] MRN: [id number] DOB: 1941/05/06   Care Coordination Outreach Attempts:  An unsuccessful telephone outreach was attempted today to offer the patient information about available care coordination services as a benefit of their health plan.   Follow Up Plan:  Additional outreach attempts will be made to offer the patient care coordination information and services.   Encounter Outcome:  No Answer   Care Coordination Interventions:  No, not indicated    Tomasa Rand, RN, BSN, [facility] [employer] [phone]

## 2022-11-26 ENCOUNTER — Telehealth: Payer: Self-pay

## 2022-11-26 NOTE — Patient Outreach (Signed)
  Care Coordination   Initial Visit Note   11/26/2022 Name: Azora Bonzo MRN: [id number] DOB: [birth year]  Natashia Roseman Deveney is a 81 y.o. year old female who sees [name], MD for primary care. I spoke with  [name] by phone today.  What matters to the patients health and wellness today?  Placed call to patient today to review and offer [facility] care coordination services. Patient reports she is doing well and denies any needs.    SDOH assessments and interventions completed:  No     Care Coordination Interventions:  No, not indicated   Follow up plan: No further intervention required.   Encounter Outcome:  Pt. Refused   Tomasa Rand, RN, BSN, [facility] [employer] [phone]

## 2022-12-17 ENCOUNTER — Other Ambulatory Visit: Payer: Self-pay | Admitting: Internal Medicine

## 2023-01-08 ENCOUNTER — Other Ambulatory Visit: Payer: Self-pay

## 2023-01-08 MED ORDER — FLUTICASONE-SALMETEROL 250-50 MCG/ACT IN AEPB: 1.0000 | INHALATION_SPRAY | Freq: Two times a day (BID) | RESPIRATORY_TRACT | 5 refills | Status: DC

## 2023-01-09 ENCOUNTER — Encounter: Payer: Self-pay | Admitting: Internal Medicine

## 2023-01-09 ENCOUNTER — Ambulatory Visit: Payer: Medicare HMO | Admitting: Internal Medicine

## 2023-01-09 VITALS — BP 126/78 | HR 58 | Temp 98.0°F | Resp 20 | Ht 67.0 in | Wt 156.8 lb

## 2023-01-09 DIAGNOSIS — M6530 Trigger finger, unspecified finger: Secondary | ICD-10-CM | POA: Diagnosis not present

## 2023-01-09 DIAGNOSIS — Z79899 Other long term (current) drug therapy: Secondary | ICD-10-CM

## 2023-01-09 DIAGNOSIS — G894 Chronic pain syndrome: Secondary | ICD-10-CM

## 2023-01-09 DIAGNOSIS — F330 Major depressive disorder, recurrent, mild: Secondary | ICD-10-CM | POA: Diagnosis not present

## 2023-01-09 DIAGNOSIS — I471 Supraventricular tachycardia, unspecified: Secondary | ICD-10-CM | POA: Insufficient documentation

## 2023-01-09 DIAGNOSIS — Z5181 Encounter for therapeutic drug level monitoring: Secondary | ICD-10-CM | POA: Diagnosis not present

## 2023-01-09 DIAGNOSIS — R413 Other amnesia: Secondary | ICD-10-CM | POA: Insufficient documentation

## 2023-01-09 DIAGNOSIS — D649 Anemia, unspecified: Secondary | ICD-10-CM | POA: Diagnosis not present

## 2023-01-09 LAB — POCT URINE DRUG SCREEN
POC Amphetamine UR: NOT DETECTED
POC BENZODIAZEPINES UR: NOT DETECTED
POC Barbiturate UR: NOT DETECTED
POC Cocaine UR: NOT DETECTED
POC Ecstasy UR: NOT DETECTED
POC Marijuana UR: NOT DETECTED
POC Methadone UR: NOT DETECTED
POC Methamphetamine UR: NOT DETECTED
POC Opiate Ur: NOT DETECTED
POC Oxycodone UR: POSITIVE — AB
POC PHENCYCLIDINE UR: NOT DETECTED
POC TRICYCLICS UR: NOT DETECTED

## 2023-01-09 LAB — POCT URINALYSIS DIPSTICK
Bilirubin, UA: NEGATIVE
Glucose, UA: POSITIVE — AB
Ketones, UA: NEGATIVE
Leukocytes, UA: NEGATIVE
Nitrite, UA: NEGATIVE
Protein, UA: NEGATIVE
Spec Grav, UA: 1.02 (ref 1.010–1.025)
Urobilinogen, UA: 0.2 E.U./dL
pH, UA: 7 (ref 5.0–8.0)

## 2023-01-09 MED ORDER — XTAMPZA ER 9 MG PO C12A: 1.0000 | EXTENDED_RELEASE_CAPSULE | Freq: Three times a day (TID) | ORAL | 0 refills | Status: AC

## 2023-01-09 MED ORDER — METOPROLOL TARTRATE 25 MG PO TABS: 12.5000 mg | ORAL_TABLET | Freq: Two times a day (BID) | ORAL | 3 refills | Status: DC

## 2023-01-09 NOTE — Assessment & Plan Note (Addendum)
On my physical exam, I noted she had an irregular irregular rhythm that was tachycardiac.  I reviewed cardiology's notes from 09/2022 and they did a zio patch at that time that showed 100% atrial flutter.  They did not add any medications at that time as she was asymptomatic and her HR was controlled.  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   She was noted to have paroxysmal A. Fib when she had pneumonia in the past.  Today, I am going to add low dose metoprolol 12.'5mg'$  BID and see her back in 1-2 weeks.

## 2023-01-09 NOTE — Assessment & Plan Note (Signed)
Her UDS was appropriate.  I did check her [name] controlled substance registyr.  We will refill her pain meds at this time.

## 2023-01-09 NOTE — Addendum Note (Signed)
Addended byLucile Shutters on: 01/09/2023 02:56 PM   Modules accepted: Orders

## 2023-01-09 NOTE — Progress Notes (Signed)
Office Visit  Subjective   Patient ID: [name]   DOB: May 20, 1941   Age: 82 y.o.   MRN: [id number]   Chief Complaint Chief Complaint  Patient presents with   Follow-up    Chronic pain     History of Present Illness [name] is a 82 yo female who  returns today for followup of her chronic pain syndrome.  Since her last visit, she states there has been no change to her chronic pain.  However, she is has developed trigger finger of her 3rd and 4th fingers on the right hand.  She cannot extend her finger completely out and she has pain associated with this.  This started about 1 month ago.  Again, she has chronic pain due to low back pain as well as pain from her history of bilateral knee replacements and bilateral shoulders.  She did see ortho for chronic right knee prosthetic pain where they did a steriod injection in 10/15/2022.  She saw [name] on 11/03/2022 where he recommended a copper fit knee sleeve but would not recommend a repeat steriod injection in a prosthetic knee due to risk of infection.  He does not recommend surgery right now but will see her back in 6 months.   This past year, we did switch her oxycodone IR over to [facility] ER '9mg'$  po TID.   However some days she only uses this BID.  The [name] is controlling her pain. We noted she had acute pain in 2022 where she was having worsening pain with weakness and fatigue.  I thought these symptoms were a combination from recovering from COVID-19 and depression.  She did improve at that time.  Due to her acute pain as described , I spoke to [name] in Pain Management around 08/2021 where we decided to hold her Xtampza for a few weeks and put her on short acting oxycodone '5mg'$  po q 6 hrs prn.  I did restart her on celexa and her depression improved at that time.  [name] surgery on 01/2020.  She was on gabapentin '200mg'$  BID for pain as well but is no longer on this. Her chronic  pain effects her neck, shoulders, lower back, hips and her knees.  She has seen [name] in pain management who recommended placing her on oxycodone ER '10mg'$  po BID.  The patient could not afford this so we switched her to oxycodone '10mg'$  po q 6 hr prn which she does take 4 times per day.  She states she could not function with her day to day activites without her pain medications.  She has been on MS contin and oxycodone ER in the past.  She tells me that this medication gives her fatigue and all she wants to do is sit down and do nothing and is not able to do alot of her ADL's.  She was previously on percocet 10/'325mg'$  po q 6hr prn (which she took 4 times per day) before the MS contin and was on this for years.  She was on long term benzos in the past but again we stopped her off of these.  The patient has seen seen [name] (orthopedics) in [place] and [name] (neurosurgery).  She remains on cyclobenzaprine as a muscle relaxant.  The patient had had left shoulder surgery in 2014 due to impingement syndrome and a chronic rotator cuff tear and labral tear where they performed a debridement and synvectomy.  This did not correct her shoulder but did reduce the amount of pain she has had in that arm/shoulder.  Her back pain has remained the same.  She also has chronic back pain as well as pain from her history of bilateral knee replacements and bilateral shoulders.   She continues to have pain in both of her knees when she gets up.  She is still using a cane and no longer uses a walker for longer distances.  The patient denies any falls.  She does use a cane to get around and has [name].  In regards to her chronic lower back pain, her pain mostly affects her when she stands up and is unchanged from her last visit.  The pain is an intermittent dull aching she rates as severe at times but again improves with sitting and medications.  There is no pain radiation.  She had back surgery 2012 where her neurosurgeon noted  she had worsening back pain with progressive break down of L1-L2 above the levels of her L2-L5 fusion in the past.  They did do surgery and removed some of her hardware.  She has tried PT and pool therapy which she states did not help.  Her pain medicine allows her to do her ADL's including cleaning her house and shopping.  The patient was tried on cymbalta in the past but this made her having nausea and vomiting.  There is no loss of bowel/bladder function or new weakness or numbness.  Her last dose of Xtampza was this morning.  [name] returns today for followup of her depression and anxiety.  On her last visit, she had stopped her abilify and her depression and anxiety had worsened over the last 3 prior months.  I did restart her back on abilify at '2mg'$  daily and over the interim, she states her depression and anxiety improved.  She is accompanied by her daughter today who states her depression and anxiety are doing well.  The patient was feeling down with sadness and crying at times but she states this has greatly improved.  I saw her in followup in 02/2021 for a yearly exam where I felt her depression was worsened.  She was having memory problems at the time which I felt was due to worsening depression.  We increased her abilify from '1mg'$  to '2mg'$  daily in early 2022.  She told me at that time that her citalopram was stopped when she was in the hospital the year before.  She had worsening depression in 2020 where I restarted her back on citalopram.  This improved some but ultimately we increased her dose to citalopram '40mg'$  daily and started her on abilify '1mg'$  daily.  I again restarted her citalopram in 08/2021 and today she states her depression is controlled.  She was also on clonazepam as needed for her anxiety but again this has been stopped in the setting of her chronic pain med use.  Today, she states that her depression and anxiety are both moderate.   I felt that her complaints of mild memory loss in the  past was due to her worsening depression.  Her memory problems worsened as well as her depression after stopping abilify in the past.  She remains on trazodone '150mg'$  po at night for sleep and is on citalopram '10mg'$  daily.  She is supposed to abilify '2mg'$  daily but she states she is not on this.  She is also on melatonin for sleep.  She denies any side effects from  her depression medications.  Again, her husband passed away in March 20, 2015 and since then she has noticed problems with her short term memory.  She is forgetting where she put things and can lose track of what she is saying with conversations.  She has a history of depression and anxiety.  She is on a number of medications which could cause her to have memory problems and we discussed stopping some of her meds but she opted not to do this.  She has feelings of isolation but no suicidal ideation, homicidal ideation, weight loss, loss of appetite, and social withdrawal. This patient feels that she is able to care for herself.  She has no significant prior history of mental health disorders.  She states that her depression and anxiety are moderate today due to her having to have a knee knee surgery for a right sided patellar fracture.  Her daughter states they have noted some memory changes that occurred since last month.  She had some difficulty yesterday remembering her daughter and difficulty remembering to buckle her seat belt.  She is forgetting words as she is starting to talk.  Again, the daugther states that there is no worsening depression.  She does have Stage IIIb CKD where her her baseline creatinine is 1.1-1.2 with GFR 46-50.  She has problems usually after diuresis in the hospital where she can go into acute on chronic renal failure.  She is avoiding nephrotoxins as able.  She came in last month with abdominal pain and we sent her for a CT with contrast of her abd/pelvis due ther history of chronic mesenteric ischemia.  Her kidney function did  elevate but after rehydration, her followup creatinine came back to baseline.  Since her last visit, her abdominal pain did resolve.  Again, Mrs. Zolman was hospitalized with abdominal pain with vomiting in 03/19/2018 where she presented to the ER.  She was admitted at that time and they peformed an abdominal aortogram which demonstrated chronic mesenteric ischemia and they placed a superior mesenteric artery stent.   She has a history of GIB in Mar 19, 2017 where she was on xarelto for a history of a DVT but was found to have acute blood loss anemia due to an upper GI bleed.  They did an EGD 01/2017 which showed nonbleeding erosive gastropathy and a capsule endoscopy showed gastric erosions.  She also had AVM's in her small bowel and a colonoscopy also performed in 01/2017 showed severe pan diverticulosis.  Her previous colonoscopy in 03/20/15 demonstrated diverticulosis.  She had anemia in 03-20-2015 and they also performed an EGD which showed gastritis. During her hospitalization, they held her anticoagulation and placed her on a PPI.  She is now permanently off her xarelto due to her her GIB.  They did not anticoagulate her but she had an IVC filter placed on 03/14/2018.  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  The patient was placed on ASA and plavix at that time but developed a large abdominal wall hematoma.  She was discharged and sent to a SNF for rehab but was readmitted a week later to [facility] due to hematuria where she was admitted on 04/01/2018.  Urology saw her and discontinued her plavix and did cystoscopy as part of her workup where they did do a transurethral resection of a bladder tumor.  They repeated her CT scan of her abdomen and saw that the abdominal wall hematoma was unchanged.  She did see  urology in 06/2018 for followup where she was having some hematuria.  They thought the pathology was benign and her hematuria did resolve.  She was also noted to have  anasarca due to hypoalbuminemia where she had her lasix increased. She did see Dr. [name] in vascular surgery in 06/2018 for followup.  He feels she has chronic abdominal pain due to recurrent mesenteric symptoms where she had been seen in the ER multiple times in 2019 with abdominal pain.  He wanted to repeat a mesenteric arteriogram but the patient refused.  She states she would do this if she returned to the ER.  He asked her to use compression stockings for her swelling in her legs when needed.  She did see GI in 05/2018 and they mentioned in their note that she has unspecified cirrhosis with etiology probably being NASH.  She stopped her iron this past year.  There is no BRBPR or melena.     Past Medical History Past Medical History:  Diagnosis Date   Anemia    bld. transfusion post lumbar surgery- 2012   Anxiety    Arthralgia    NOS   CAD (coronary artery disease)    s/p CABG 2004; s/p DES to LM in 2010;  [place] 10/29/11: EF 50-55%, mild elevated filling pressures, no pulmonary HTN, LM 90% ISR, LAD and CFX occluded, S-RI occluded (old), S-OM3 ok and L-LAD ok, native nondominant RCA 95% -  med rx recommended ; Lexiscan Myoview 7/13 at [facility]: demonstrated "normal LV function, anterior attenuation and localized ischemia, inferior, basilar, mid section"   Carotid artery disease ([place])    Carotid US 2/56:  RICA 3-89; LICA 37-34; R subclavian stenosis - Repeat 1 year. // Carotid US 05/2019: R 1-39; L 40-59; R vertebral with atypical antegrade flow; R subclavian stenosis >> repeat 1 year // Carotid US 7/21: Bilat 40-59; L subclavian stenosis // Carotid US 7/22: Bilat ICA 40-59; R subclavian stenosis   CHF (congestive heart failure) (HCC) 01/21/2020   Chronic diastolic heart failure (HCC)    Echo 9/10: EF 28-76%, grade 1 diastolic dysfunction   Chronic lower back pain    Chronic respiratory failure (HCC)    Cirrhosis (HCC)    CKD (chronic kidney disease), stage III (HCC)    COPD (chronic  obstructive pulmonary disease) ([place])    Emphysema dxed by Dr. Woody Seller in [place] based on PFTs per pt in 2006; placed on albuterol   Degenerative disc disease, lumbar    Depression    TAKES CELEXA  AND  (OFF- WELBUTRIN)   Diuretic-induced hypokalemia 10/08/2018   DVT of lower extremity (deep venous thrombosis) (HCC)    recurrent. bilateral (2 episodes)   Dyspnea    home o2 when needed   Dysrhythmia    afib with cabg   Exertional angina    Treated with Isosorbide, Ranexa, amlodipine; intolerant to metoprolol   GERD (gastroesophageal reflux disease)    Gout    "on daily RX" (10/06/2018)   HLD (hyperlipidemia)    Hyperlipidemia    Hypertension    Hypothyroidism    L Subclavian Artery Stenosis    Carotid US 7/21    Obesity (BMI 30-39.9) 2009   BMI 33   On home oxygen therapy    OSA (obstructive sleep apnea)    Osteoarthritis    "all over; hands" (10/06/2018)   Osteoporosis    Oxygen dependent    4L at home as needed (10/06/2018)   PVD (peripheral vascular disease) ([place])  Restless leg syndrome    Vitamin D deficiency      Allergies Allergies  Allergen Reactions   Ativan [Lorazepam] Other (See Comments)    Confusion    Pitavastatin Itching    Reaction to Livalo   Ropinirole Nausea Only and Other (See Comments)    Makes legs jump, also   Zofran [Ondansetron Hcl] Nausea Only   Zolpidem Tartrate Anxiety and Other (See Comments)    CONFUSION    Penicillins Rash and Other (See Comments)    Tolerated cephalosporins in past Has patient had a PCN reaction causing immediate rash, facial/tongue/throat swelling, SOB or lightheadedness with hypotension: Yes Has patient had a PCN reaction causing severe rash involving mucus membranes or skin necrosis: No Has patient had a PCN reaction that required hospitalization: No Has patient had a PCN reaction occurring within the last 10 years: No If all of the above answers are "NO", then may proceed with Cephalosporin use.       Medications  Current Outpatient Medications:    fluticasone-salmeterol (ADVAIR DISKUS) 250-50 MCG/ACT AEPB, Inhale 1 puff into the lungs in the morning and at bedtime., Disp: 1 each, Rfl: 5   metoprolol tartrate (LOPRESSOR) 25 MG tablet, Take 0.5 tablets (12.5 mg total) by mouth 2 (two) times daily., Disp: 15 tablet, Rfl: 3   acetaminophen (TYLENOL) 500 MG tablet, 1000 mg by oral route., Disp: , Rfl:    albuterol (PROVENTIL) (2.5 MG/3ML) 0.083% nebulizer solution, Take 2.5 mg by nebulization every 6 (six) hours as needed for wheezing or shortness of breath., Disp: , Rfl:    allopurinol (ZYLOPRIM) 100 MG tablet, Take 1 tablet (100 mg total) by mouth daily with breakfast., Disp: 90 tablet, Rfl: 1   amLODipine (NORVASC) 2.5 MG tablet, Take 1/2 tablet as needed for BP greater than 160., Disp: 30 tablet, Rfl: 1   ARIPiprazole (ABILIFY) 2 MG tablet, Take 2 mg by mouth daily with breakfast., Disp: , Rfl:    aspirin EC 81 MG tablet, Take 81 mg by mouth daily with breakfast. Swallow whole., Disp: , Rfl:    atorvastatin (LIPITOR) 20 MG tablet, Take 20 mg by mouth daily with supper., Disp: , Rfl:    cholecalciferol (VITAMIN D3) 25 MCG (1000 UNIT) tablet, Take 1 tablet (1,000 Units total) by mouth daily with supper., Disp: 90 tablet, Rfl: 1   citalopram (CELEXA) 10 MG tablet, Take 10 mg by mouth daily., Disp: , Rfl:    cyclobenzaprine (FLEXERIL) 10 MG tablet, Take 10 mg by mouth 3 (three) times daily as needed for muscle spasms., Disp: , Rfl:    dapagliflozin propanediol (FARXIGA) 10 MG TABS tablet, Take 1 tablet (10 mg total) by mouth daily before breakfast., Disp: 90 tablet, Rfl: 3   Docusate Sodium (DSS) 100 MG CAPS, Take 100 mg by mouth daily as needed (constipation)., Disp: , Rfl:    furosemide (LASIX) 80 MG tablet, Take 1 tablet (80 mg total) by mouth daily. (Patient taking differently: Take 80 mg by mouth every other day.), Disp: 90 tablet, Rfl: 3   gabapentin (NEURONTIN) 100 MG capsule, Take 200 mg  by mouth at bedtime., Disp: , Rfl:    Ipratropium-Albuterol (COMBIVENT RESPIMAT) 20-100 MCG/ACT AERS respimat, Inhale 1 puff into the lungs 3 (three) times daily as needed for wheezing or shortness of breath., Disp: , Rfl:    levothyroxine (SYNTHROID) 50 MCG tablet, TAKE 1 TABLET BY MOUTH DAILY BEFORE BREAKFAST., Disp: 90 tablet, Rfl: 3   magnesium gluconate (MAGONATE) 500 MG tablet, Take  500 mg by mouth daily., Disp: , Rfl:    Melatonin 10 MG TABS, Take 30 mg by mouth at bedtime., Disp: , Rfl:    metolazone (ZAROXOLYN) 2.5 MG tablet, Take 1 tablet (2.5 mg total) by mouth once a week. Take 30 minutes before Lasix (furosemide)., Disp: 30 tablet, Rfl: 0   nitroGLYCERIN (NITROSTAT) 0.4 MG SL tablet, Place 1 tablet (0.4 mg total) under the tongue every 5 (five) minutes as needed for chest pain. For chest pain, Disp: 25 tablet, Rfl: 3   Omega-3 Fatty Acids (FISH OIL) 1000 MG CPDR, Take 1,000 mg by mouth daily. (Patient not taking: Reported on 09/23/2022), Disp: , Rfl:    [START ON 01/15/2023] oxyCODONE ER (XTAMPZA ER) 9 MG C12A, Take 1 capsule by mouth in the morning, at noon, and at bedtime for 90 doses., Disp: 90 capsule, Rfl: 0   OXYGEN, Inhale 4 L/min into the lungs as needed (DURING ALL TIMES OF EXERTION). , Disp: , Rfl:    pantoprazole (PROTONIX) 40 MG tablet, Take 40 mg by mouth daily., Disp: , Rfl:    pilocarpine (PILOCAR) 1 % ophthalmic solution, Place 1 drop into both eyes 2 (two) times daily., Disp: , Rfl:    polyethylene glycol (MIRALAX / GLYCOLAX) 17 g packet, Take 17 g by mouth as needed., Disp: , Rfl:    potassium chloride SA (KLOR-CON) 20 MEQ tablet, Take 20 mEq by mouth daily with lunch., Disp: , Rfl:    pramipexole (MIRAPEX) 0.25 MG tablet, Take 0.25 mg by mouth daily with supper., Disp: , Rfl:    roflumilast (DALIRESP) 500 MCG TABS tablet, Take 500 mcg by mouth daily., Disp: , Rfl:    spironolactone (ALDACTONE) 25 MG tablet, TAKE 1 TABLET BY MOUTH DAILY., Disp: 90 tablet, Rfl: 2    traZODone (DESYREL) 50 MG tablet, Take 150 mg by mouth at bedtime. , Disp: , Rfl:    vitamin C (ASCORBIC ACID) 500 MG tablet, Take 500 mg by mouth daily with lunch., Disp: , Rfl:    Review of Systems Review of Systems  Constitutional:  Negative for chills, fever and weight loss.  Eyes:  Positive for blurred vision. Negative for double vision.  Respiratory:  Negative for cough, shortness of breath and wheezing.   Cardiovascular:  Positive for leg swelling. Negative for chest pain and palpitations.  Gastrointestinal:  Positive for heartburn. Negative for abdominal pain, blood in stool, constipation, diarrhea, melena, nausea and vomiting.  Genitourinary:  Positive for frequency. Negative for dysuria and hematuria.  Musculoskeletal:  Positive for back pain and joint pain. Negative for myalgias.  Skin:  Negative for itching and rash.  Neurological:  Negative for dizziness, weakness and headaches.  Psychiatric/Behavioral:  Negative for depression. The patient is not nervous/anxious.        Objective:    Vitals BP 126/78 (BP Location: Left Arm, Patient Position: Sitting, Cuff Size: Normal)   Pulse (!) 58   Temp 98 F (36.7 C) (Temporal)   Resp 20   Ht '5\' 7"'$  (1.702 m)   Wt 156 lb 12.8 oz (71.1 kg)   SpO2 92%   BMI 24.56 kg/m    Physical Examination Physical Exam Constitutional:      Appearance: Normal appearance. She is not ill-appearing.  Cardiovascular:     Rate and Rhythm: Tachycardia present. Rhythm irregular.     Pulses: Normal pulses.     Heart sounds: No murmur heard.    No friction rub. No gallop.  Pulmonary:  Effort: Pulmonary effort is normal. No respiratory distress.     Breath sounds: No wheezing, rhonchi or rales.  Abdominal:     General: Abdomen is flat. Bowel sounds are normal. There is no distension.     Palpations: Abdomen is soft.     Tenderness: There is no abdominal tenderness.  Musculoskeletal:     Right lower leg: No edema.     Left lower leg: No  edema.  Skin:    General: Skin is warm and dry.     Findings: No rash.  Neurological:     General: No focal deficit present.     Mental Status: She is alert and oriented to person, place, and time.  Psychiatric:        Mood and Affect: Mood normal.        Behavior: Behavior normal.        Assessment & Plan:   SVT (supraventricular tachycardia) On my physical exam, I noted she had an irregular irregular rhythm that was tachycardiac.  I reviewed cardiology's notes from 09/2022 and they did a zio patch at that time that showed 100% atrial flutter.  They did not add any medications at that time as she was asymptomatic and her HR was controlled.  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   She was noted to have paroxysmal A. Fib when she had pneumonia in the past.  Today, I am going to add low dose metoprolol 12.'5mg'$  BID and see her back in 1-2 weeks.  Anemia She has a history of anemia due to AVM's where we have been monitoring her blood counts.  Depression It sounds like her depression and anxiety are still stable and she is doing well.  We will continue her current meds.  Memory loss Her MMSE 04/2022 was 28/30.  I repeated this again today and she scored a 24/30.  I am going to check a UA, CBC, CMP, TSH on her and see if there is a medical cause of memory loss.  I do not think this is from depression or her medications.  Futher care depends on her lab work.  Chronic pain Her UDS was appropriate.  I did check her [place] controlled substance registyr.  We will refill her pain meds at this time.    Return in about 3 months (around 04/10/2023).   [name], MD

## 2023-01-09 NOTE — Assessment & Plan Note (Signed)
Her MMSE 04/2022 was 28/30.  I repeated this again today and she scored a 24/30.  I am going to check a UA, CBC, CMP, TSH on her and see if there is a medical cause of memory loss.  I do not think this is from depression or her medications.  Futher care depends on her lab work.

## 2023-01-09 NOTE — Assessment & Plan Note (Signed)
She has a history of anemia due to AVM's where we have been monitoring her blood counts.

## 2023-01-09 NOTE — Assessment & Plan Note (Signed)
It sounds like her depression and anxiety are still stable and she is doing well.  We will continue her current meds.

## 2023-01-10 LAB — CMP14 + ANION GAP
ALT: 16 IU/L (ref 0–32)
AST: 12 IU/L (ref 0–40)
Albumin/Globulin Ratio: 0.9 — ABNORMAL LOW (ref 1.2–2.2)
Albumin: 3.6 g/dL — ABNORMAL LOW (ref 3.7–4.7)
Alkaline Phosphatase: 107 IU/L (ref 44–121)
Anion Gap: 14 mmol/L (ref 10.0–18.0)
BUN/Creatinine Ratio: 14 (ref 12–28)
BUN: 15 mg/dL (ref 8–27)
Bilirubin Total: 0.4 mg/dL (ref 0.0–1.2)
CO2: 31 mmol/L — ABNORMAL HIGH (ref 20–29)
Calcium: 9.9 mg/dL (ref 8.7–10.3)
Chloride: 98 mmol/L (ref 96–106)
Creatinine, Ser: 1.07 mg/dL — ABNORMAL HIGH (ref 0.57–1.00)
Globulin, Total: 3.8 g/dL (ref 1.5–4.5)
Glucose: 100 mg/dL — ABNORMAL HIGH (ref 70–99)
Potassium: 4.4 mmol/L (ref 3.5–5.2)
Sodium: 143 mmol/L (ref 134–144)
Total Protein: 7.4 g/dL (ref 6.0–8.5)
eGFR: 52 mL/min/{1.73_m2} — ABNORMAL LOW (ref >59)

## 2023-01-10 LAB — CBC WITH DIFFERENTIAL/PLATELET
Basophils Absolute: 0.1 10*3/uL (ref 0.0–0.2)
Basos: 1 %
EOS (ABSOLUTE): 0.2 10*3/uL (ref 0.0–0.4)
Eos: 1 %
Hematocrit: 42.8 % (ref 34.0–46.6)
Hemoglobin: 13.7 g/dL (ref 11.1–15.9)
Immature Grans (Abs): 0.7 10*3/uL — ABNORMAL HIGH (ref 0.0–0.1)
Immature Granulocytes: 5 %
Lymphocytes Absolute: 1.8 10*3/uL (ref 0.7–3.1)
Lymphs: 14 %
MCH: 28 pg (ref 26.6–33.0)
MCHC: 32 g/dL (ref 31.5–35.7)
MCV: 88 fL (ref 79–97)
Monocytes Absolute: 0.9 10*3/uL (ref 0.1–0.9)
Monocytes: 7 %
Neutrophils Absolute: 8.7 10*3/uL — ABNORMAL HIGH (ref 1.4–7.0)
Neutrophils: 72 %
Platelets: 378 10*3/uL (ref 150–450)
RBC: 4.89 x10E6/uL (ref 3.77–5.28)
RDW: 14.2 % (ref 11.7–15.4)
WBC: 12.3 10*3/uL — ABNORMAL HIGH (ref 3.4–10.8)

## 2023-01-10 LAB — TSH: TSH: 6.47 u[IU]/mL — ABNORMAL HIGH (ref 0.450–4.500)

## 2023-01-10 LAB — MAGNESIUM: Magnesium: 2.3 mg/dL (ref 1.6–2.3)

## 2023-01-26 ENCOUNTER — Ambulatory Visit: Payer: Medicare HMO | Admitting: Internal Medicine

## 2023-01-26 ENCOUNTER — Encounter: Payer: Self-pay | Admitting: Internal Medicine

## 2023-01-26 VITALS — BP 152/78 | HR 96 | Temp 97.6°F | Resp 16 | Ht 65.0 in | Wt 153.8 lb

## 2023-01-26 DIAGNOSIS — I471 Supraventricular tachycardia, unspecified: Secondary | ICD-10-CM | POA: Insufficient documentation

## 2023-01-26 NOTE — Progress Notes (Signed)
Office Visit  Subjective   Patient ID: [name]   DOB: Jul 04, 1941   Age: 82 y.o.   MRN: [id number]   Chief Complaint Chief Complaint  Patient presents with   Follow-up     History of Present Illness [name] is a 82 yo female who comes in today for followup of PSVT where I saw her 2 weeks ago and noted she had an irregular rhythm where we did an EKG that showed supraventricular tachycardia with a HR of 113.  I reviewed cardiology's notes from 09/2022 and they did a zio patch at that time that showed 100% atrial flutter.  They did not add any medications at that time as she was asymptomatic and her HR was controlled.  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   She was noted to have paroxysmal A. Fib when she had pneumonia in the past. I did start her on low dose metoprolol 12.'5mg'$  BID for this PSVT from 2 weeks ago and she comes in today for a followup.       Past Medical History Past Medical History:  Diagnosis Date   Anemia    bld. transfusion post lumbar surgery- 2012   Anxiety    Arthralgia    NOS   CAD (coronary artery disease)    s/p CABG 2004; s/p DES to LM in 2010;  [place] 10/29/11: EF 50-55%, mild elevated filling pressures, no pulmonary HTN, LM 90% ISR, LAD and CFX occluded, S-RI occluded (old), S-OM3 ok and L-LAD ok, native nondominant RCA 95% -  med rx recommended ; Lexiscan Myoview 7/13 at [name]: demonstrated "normal LV function, anterior attenuation and localized ischemia, inferior, basilar, mid section"   Carotid artery disease ([place])    Carotid US 4/09:  RICA 8-11; LICA 91-47; R subclavian stenosis - Repeat 1 year. // Carotid US 05/2019: R 1-39; L 40-59; R vertebral with atypical antegrade flow; R subclavian stenosis >> repeat 1 year // Carotid US 7/21: Bilat 40-59; L subclavian stenosis // Carotid US 7/22: Bilat ICA 40-59; R subclavian stenosis   CHF (congestive heart failure) (HCC) 01/21/2020   Chronic diastolic heart  failure (HCC)    Echo 9/10: EF 82-95%, grade 1 diastolic dysfunction   Chronic lower back pain    Chronic respiratory failure (HCC)    Cirrhosis (HCC)    CKD (chronic kidney disease), stage III (HCC)    COPD (chronic obstructive pulmonary disease) ([place])    Emphysema dxed by Dr. Woody Seller in [place] based on PFTs per pt in 2006; placed on albuterol   Degenerative disc disease, lumbar    Depression    TAKES CELEXA  AND  (OFF- WELBUTRIN)   Diuretic-induced hypokalemia 10/08/2018   DVT of lower extremity (deep venous thrombosis) (HCC)    recurrent. bilateral (2 episodes)   Dyspnea    home o2 when needed   Dysrhythmia    afib with cabg   Exertional angina    Treated with Isosorbide, Ranexa, amlodipine; intolerant to metoprolol   GERD (gastroesophageal reflux disease)    Gout    "on daily RX" (10/06/2018)   HLD (hyperlipidemia)    Hyperlipidemia    Hypertension    Hypothyroidism    L Subclavian Artery Stenosis    Carotid US 7/21    Obesity (BMI 30-39.9) 2009   BMI 33   On home oxygen therapy    OSA (obstructive sleep apnea)    Osteoarthritis    "all over;  hands" (10/06/2018)   Osteoporosis    Oxygen dependent    4L at home as needed (10/06/2018)   PVD (peripheral vascular disease) (HCC)    Restless leg syndrome    Vitamin D deficiency      Allergies Allergies  Allergen Reactions   Ativan [Lorazepam] Other (See Comments)    Confusion    Pitavastatin Itching    Reaction to Livalo   Ropinirole Nausea Only and Other (See Comments)    Makes legs jump, also   Zofran [Ondansetron Hcl] Nausea Only   Zolpidem Tartrate Anxiety and Other (See Comments)    CONFUSION    Penicillins Rash and Other (See Comments)    Tolerated cephalosporins in past Has patient had a PCN reaction causing immediate rash, facial/tongue/throat swelling, SOB or lightheadedness with hypotension: Yes Has patient had a PCN reaction causing severe rash involving mucus membranes or skin necrosis: No Has  patient had a PCN reaction that required hospitalization: No Has patient had a PCN reaction occurring within the last 10 years: No If all of the above answers are "NO", then may proceed with Cephalosporin use.      Medications  Current Outpatient Medications:    acetaminophen (TYLENOL) 500 MG tablet, 1000 mg by oral route., Disp: , Rfl:    albuterol (PROVENTIL) (2.5 MG/3ML) 0.083% nebulizer solution, Take 2.5 mg by nebulization every 6 (six) hours as needed for wheezing or shortness of breath., Disp: , Rfl:    allopurinol (ZYLOPRIM) 100 MG tablet, Take 1 tablet (100 mg total) by mouth daily with breakfast., Disp: 90 tablet, Rfl: 1   amLODipine (NORVASC) 2.5 MG tablet, Take 1/2 tablet as needed for BP greater than 160., Disp: 30 tablet, Rfl: 1   ARIPiprazole (ABILIFY) 2 MG tablet, Take 2 mg by mouth daily with breakfast., Disp: , Rfl:    aspirin EC 81 MG tablet, Take 81 mg by mouth daily with breakfast. Swallow whole., Disp: , Rfl:    atorvastatin (LIPITOR) 20 MG tablet, Take 20 mg by mouth daily with supper., Disp: , Rfl:    cholecalciferol (VITAMIN D3) 25 MCG (1000 UNIT) tablet, Take 1 tablet (1,000 Units total) by mouth daily with supper., Disp: 90 tablet, Rfl: 1   citalopram (CELEXA) 10 MG tablet, Take 10 mg by mouth daily., Disp: , Rfl:    cyclobenzaprine (FLEXERIL) 10 MG tablet, Take 10 mg by mouth 3 (three) times daily as needed for muscle spasms., Disp: , Rfl:    dapagliflozin propanediol (FARXIGA) 10 MG TABS tablet, Take 1 tablet (10 mg total) by mouth daily before breakfast., Disp: 90 tablet, Rfl: 3   Docusate Sodium (DSS) 100 MG CAPS, Take 100 mg by mouth daily as needed (constipation)., Disp: , Rfl:    fluticasone-salmeterol (ADVAIR DISKUS) 250-50 MCG/ACT AEPB, Inhale 1 puff into the lungs in the morning and at bedtime., Disp: 1 each, Rfl: 5   furosemide (LASIX) 80 MG tablet, Take 1 tablet (80 mg total) by mouth daily. (Patient taking differently: Take 80 mg by mouth every other  day.), Disp: 90 tablet, Rfl: 3   gabapentin (NEURONTIN) 100 MG capsule, Take 200 mg by mouth at bedtime., Disp: , Rfl:    Ipratropium-Albuterol (COMBIVENT RESPIMAT) 20-100 MCG/ACT AERS respimat, Inhale 1 puff into the lungs 3 (three) times daily as needed for wheezing or shortness of breath., Disp: , Rfl:    levothyroxine (SYNTHROID) 50 MCG tablet, TAKE 1 TABLET BY MOUTH DAILY BEFORE BREAKFAST., Disp: 90 tablet, Rfl: 3   magnesium gluconate (MAGONATE)  500 MG tablet, Take 500 mg by mouth daily., Disp: , Rfl:    Melatonin 10 MG TABS, Take 30 mg by mouth at bedtime., Disp: , Rfl:    metolazone (ZAROXOLYN) 2.5 MG tablet, Take 1 tablet (2.5 mg total) by mouth once a week. Take 30 minutes before Lasix (furosemide)., Disp: 30 tablet, Rfl: 0   metoprolol tartrate (LOPRESSOR) 25 MG tablet, Take 0.5 tablets (12.5 mg total) by mouth 2 (two) times daily., Disp: 15 tablet, Rfl: 3   nitroGLYCERIN (NITROSTAT) 0.4 MG SL tablet, Place 1 tablet (0.4 mg total) under the tongue every 5 (five) minutes as needed for chest pain. For chest pain, Disp: 25 tablet, Rfl: 3   Omega-3 Fatty Acids (FISH OIL) 1000 MG CPDR, Take 1,000 mg by mouth daily. (Patient not taking: Reported on 09/23/2022), Disp: , Rfl:    oxyCODONE ER (XTAMPZA ER) 9 MG C12A, Take 1 capsule by mouth in the morning, at noon, and at bedtime for 90 doses., Disp: 90 capsule, Rfl: 0   OXYGEN, Inhale 4 L/min into the lungs as needed (DURING ALL TIMES OF EXERTION). , Disp: , Rfl:    pantoprazole (PROTONIX) 40 MG tablet, Take 40 mg by mouth daily., Disp: , Rfl:    pilocarpine (PILOCAR) 1 % ophthalmic solution, Place 1 drop into both eyes 2 (two) times daily., Disp: , Rfl:    polyethylene glycol (MIRALAX / GLYCOLAX) 17 g packet, Take 17 g by mouth as needed., Disp: , Rfl:    potassium chloride SA (KLOR-CON) 20 MEQ tablet, Take 20 mEq by mouth daily with lunch., Disp: , Rfl:    pramipexole (MIRAPEX) 0.25 MG tablet, Take 0.25 mg by mouth daily with supper., Disp: , Rfl:     roflumilast (DALIRESP) 500 MCG TABS tablet, Take 500 mcg by mouth daily., Disp: , Rfl:    spironolactone (ALDACTONE) 25 MG tablet, TAKE 1 TABLET BY MOUTH DAILY., Disp: 90 tablet, Rfl: 2   traZODone (DESYREL) 50 MG tablet, Take 150 mg by mouth at bedtime. , Disp: , Rfl:    vitamin C (ASCORBIC ACID) 500 MG tablet, Take 500 mg by mouth daily with lunch., Disp: , Rfl:    Review of Systems Review of Systems  Constitutional:  Negative for chills and fever.  Eyes:  Negative for blurred vision and double vision.  Cardiovascular:  Negative for chest pain, palpitations and leg swelling.  Gastrointestinal:  Negative for abdominal pain, constipation, diarrhea, nausea and vomiting.  Musculoskeletal:  Negative for myalgias.  Neurological:  Negative for dizziness, weakness and headaches.       Objective:    Vitals BP (!) 152/78   Pulse 96   Temp 97.6 F (36.4 C)   Resp 16   Ht '5\' 5"'$  (1.651 m)   Wt 153 lb 12.8 oz (69.8 kg)   SpO2 93%   BMI 25.59 kg/m    Physical Examination Physical Exam Constitutional:      Appearance: Normal appearance. She is not ill-appearing.  Cardiovascular:     Rate and Rhythm: Normal rate and regular rhythm.     Pulses: Normal pulses.     Heart sounds: No murmur heard.    No friction rub. No gallop.  Pulmonary:     Effort: Pulmonary effort is normal. No respiratory distress.     Breath sounds: No wheezing, rhonchi or rales.  Abdominal:     General: Bowel sounds are normal. There is no distension.     Palpations: Abdomen is soft.  Tenderness: There is no abdominal tenderness.  Musculoskeletal:     Right lower leg: No edema.     Left lower leg: No edema.  Skin:    General: Skin is warm and dry.     Findings: No rash.  Neurological:     Mental Status: She is alert.        Assessment & Plan:   PSVT (paroxysmal supraventricular tachycardia) Her HR is now in the 90's.  She is asymptomatic and I am going to continue her on her metoprolol 12.'5mg'$   BID.    Return in about 3 months (around 04/26/2023).   [name], MD

## 2023-01-26 NOTE — Assessment & Plan Note (Signed)
Her HR is now in the 90's.  She is asymptomatic and I am going to continue her on her metoprolol 12.'5mg'$  BID.

## 2023-02-05 ENCOUNTER — Other Ambulatory Visit: Payer: Self-pay | Admitting: Internal Medicine

## 2023-02-06 ENCOUNTER — Ambulatory Visit: Payer: Medicare HMO | Admitting: Internal Medicine

## 2023-02-09 ENCOUNTER — Telehealth: Payer: Self-pay | Admitting: Orthopaedic Surgery

## 2023-02-09 NOTE — Telephone Encounter (Signed)
Patients daughter states patient woke up this morning in sever pain and her knee is swollen. Asking to be seen ASAP. Sending call to triage

## 2023-02-10 ENCOUNTER — Ambulatory Visit (INDEPENDENT_AMBULATORY_CARE_PROVIDER_SITE_OTHER): Payer: Medicare HMO

## 2023-02-10 ENCOUNTER — Ambulatory Visit (INDEPENDENT_AMBULATORY_CARE_PROVIDER_SITE_OTHER): Payer: Medicare HMO | Admitting: Orthopaedic Surgery

## 2023-02-10 ENCOUNTER — Encounter: Payer: Self-pay | Admitting: Orthopaedic Surgery

## 2023-02-10 VITALS — BP 119/57 | HR 66 | Ht 67.0 in | Wt 150.0 lb

## 2023-02-10 DIAGNOSIS — G8929 Other chronic pain: Secondary | ICD-10-CM

## 2023-02-10 DIAGNOSIS — M25561 Pain in right knee: Secondary | ICD-10-CM | POA: Diagnosis not present

## 2023-02-10 NOTE — Progress Notes (Signed)
Office Visit Note   Patient: [name]           Date of Birth: 05-Sep-1941           MRN: [id number] Visit Date: 02/10/2023              Requested by: [name], MD [address],  [place] [address] PCP: [name], MD   Assessment & Plan: Visit Diagnoses:  1. Chronic pain of right knee     Plan: We discussed with her that I am think unlikely the patellar revision will give her pain relief.  Component appears to be well-fixed and will let her use the short knee immobilizer that she can wear intermittently to take some pressure off the knee.  She has previously tried a copper sleeve without resolution.  Follow-Up Instructions: No follow-ups on file.   Orders:  Orders Placed This Encounter  Procedures   XR KNEE 3 VIEW RIGHT   No orders of the defined types were placed in this encounter.     Procedures: No procedures performed   Clinical Data: No additional findings.   Subjective: Chief Complaint  Patient presents with   Right Knee - Pain    HPI 82 year old female post knee arthroplasty done in [place] 2007 by Dr.Feder with persistent right knee patellar pain.  She had 1 single injection of cortisone that gave her some relief and Dr. Ninfa Linden discussed with her that repetitive cortisone injections are not a good idea with risks of infection.  She is seeing Dr. Alvan Dame did her  left hip arthroplasty.  X-rays that show persistent lateral osteophyte or lateral patellofemoral ligament calcification with small crack which looks like a small avulsion.  She was told about this up to 4 years ago and it has been persistent.  Sometimes she notes some mild swelling in her knee aspiration in the past showed no evidence of infection.  Bone scan showed slight increased uptake around the patella.  Serial x-rays showed no evidence of patellar component loosening.  Pain seems to be worse when she is on her knee more.  She denies right groin pain.  Previous total of  arthroplasty was on the left.  She denies radicular symptoms associated back pain.  Slight numbness immediately adjacent to the midline right total knee arthroplasty incision as expected.  Review of Systems positive for a COPD history of heart failure total hip arthroplasty total knee arthroplasty.   Objective: Vital Signs: BP (!) 119/57   Pulse 66   Ht 5' 7"$  (1.702 m)   Wt 150 lb (68 kg)   BMI 23.49 kg/m   Physical Exam Constitutional:      Appearance: She is well-developed.  HENT:     Head: Normocephalic.     Right Ear: External ear normal.     Left Ear: External ear normal. There is no impacted cerumen.  Eyes:     Pupils: Pupils are equal, round, and reactive to light.  Neck:     Thyroid: No thyromegaly.     Trachea: No tracheal deviation.  Cardiovascular:     Rate and Rhythm: Normal rate.  Pulmonary:     Effort: Pulmonary effort is normal.  Abdominal:     Palpations: Abdomen is soft.  Musculoskeletal:     Cervical back: No rigidity.  Skin:    General: Skin is warm and dry.  Neurological:     Mental Status: She is alert and oriented to person,  place, and time.  Psychiatric:        Behavior: Behavior normal.     Ortho Exam patient has some tenderness palpation over the patella no patellar lateral subluxation normal patellar tracking.  Pulses are normal negative logroll the hips no pain with internal rotation right hip.  No knee effusion well-healed midline incision.  Distal quad tendon nontender and there is some palpable nodules where she has some calcific tendinopathy.  Patellar tendon is normal.  Specialty Comments:  No specialty comments available.  Imaging: No results found.   PMFS History: Patient Active Problem List   Diagnosis Date Noted   PSVT (paroxysmal supraventricular tachycardia) 01/26/2023   Memory loss 01/09/2023   SVT (supraventricular tachycardia) 01/09/2023   Trigger finger of right hand 01/09/2023   Anemia 01/09/2023   Chronic pain  05/11/2021   Acute exacerbation of congestive heart failure ([place]) 05/11/2021   Chest pain 05/10/2021   COPD exacerbation ([place]) 04/29/2021   Prolonged QT interval 04/29/2021   Depression 04/29/2021   RLS (restless legs syndrome) 04/29/2021   CAD (coronary artery disease) of bypass graft 02/07/2021   CHF (congestive heart failure) ([place]) 02/06/2021   CHF (congestive heart failure), NYHA class III, acute, diastolic ([place]) [id number]   Normocytic anemia 10/28/2020   Acute respiratory failure with hypoxia ([place]) 10/09/2020   Hypotension 10/09/2020   CAP (community acquired pneumonia) 10/09/2020   L Subclavian Artery Stenosis    S/P hip replacement, left 02/07/2020   History of pulmonary embolism 10/04/2019   Dyspnea 12/24/2018   Cellulitis 12/24/2018   Otitis media 12/24/2018   Diuretic-induced hypokalemia 10/08/2018   Acute on chronic diastolic heart failure ([place]) 10/06/2018   Hematuria 04/01/2018   Acute lower UTI 04/01/2018   Hematoma of abdominal wall, subsequent encounter 04/01/2018   Chronic diastolic heart failure ([place]) 03/29/2018   GERD without esophagitis 03/29/2018   Chronic gout 03/29/2018   Chronic bilateral low back pain without sciatica 03/29/2018   Colitis 03/10/2018   AKI (acute kidney injury) ([place])    Ascites    Pulmonary embolism ([place]) 03/01/2018   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[place]) 03/01/2018   Acute on chronic respiratory failure with hypoxia ([place]) 03/01/2018   Angiodysplasia of intestine    Hematochezia    Malnutrition of moderate degree 02/15/2017   Blood loss anemia 02/13/2017   Impingement syndrome of left shoulder 02/10/2013   PAD (peripheral artery disease) ([place]) 11/17/2011   Dyslipidemia 10/17/2011   Coronary artery disease involving native coronary artery of native heart with angina pectoris ([place])    Hypothyroidism 05/03/2008   Essential hypertension 05/03/2008   Sleep apnea 05/03/2008   COPD (chronic obstructive pulmonary disease) ([place]) 04/12/2008    Past Medical History:  Diagnosis Date   Anemia    bld. transfusion post lumbar surgery- 2012   Anxiety    Arthralgia    NOS   CAD (coronary artery disease)    s/p CABG 2004; s/p DES to LM in 2010;  [place] 10/29/11: EF 50-55%, mild elevated filling pressures, no pulmonary HTN, LM 90% ISR, LAD and CFX occluded, S-RI occluded (old), S-OM3 ok and L-LAD ok, native nondominant RCA 95% -  med rx recommended ; Lexiscan Myoview 7/13 at [facility]: demonstrated "normal LV function, anterior attenuation and localized ischemia, inferior, basilar, mid section"   Carotid artery disease ([place])    Carotid US [id number]:  RICA [id number]; LICA [id number]; R subclavian stenosis - Repeat 1 year. // Carotid US 05/2019: R 1-39; L 40-59; R vertebral with atypical antegrade flow;  R subclavian stenosis >> repeat 1 year // Carotid US 7/21: Bilat 40-59; L subclavian stenosis // Carotid US 7/22: Bilat ICA 40-59; R subclavian stenosis   CHF (congestive heart failure) ([place]) 01/21/2020   Chronic diastolic heart failure (HCC)    Echo 9/10: EF Q000111Q, grade 1 diastolic dysfunction   Chronic lower back pain    Chronic respiratory failure (HCC)    Cirrhosis (HCC)    CKD (chronic kidney disease), stage III (HCC)    COPD (chronic obstructive pulmonary disease) ([place])    Emphysema dxed by Dr. Woody Seller in [place] based on PFTs per pt in 2006; placed on albuterol   Degenerative disc disease, lumbar    Depression    TAKES CELEXA  AND  (OFF- WELBUTRIN)   Diuretic-induced hypokalemia 10/08/2018   DVT of lower extremity (deep venous thrombosis) (HCC)    recurrent. bilateral (2 episodes)   Dyspnea    home o2 when needed   Dysrhythmia    afib with cabg   Exertional angina    Treated with Isosorbide, Ranexa, amlodipine; intolerant to metoprolol   GERD (gastroesophageal reflux disease)    Gout    "on daily RX" (10/06/2018)   HLD (hyperlipidemia)    Hyperlipidemia    Hypertension    Hypothyroidism    L Subclavian Artery Stenosis    Carotid US  7/21    Obesity (BMI 30-39.9) 2009   BMI 33   On home oxygen therapy    OSA (obstructive sleep apnea)    Osteoarthritis    "all over; hands" (10/06/2018)   Osteoporosis    Oxygen dependent    4L at home as needed (10/06/2018)   PVD (peripheral vascular disease) ([place])    Restless leg syndrome    Vitamin D deficiency     Family History  Problem Relation Age of Onset   Ovarian cancer Mother    Lung cancer Father    COPD Father    Heart disease Father    Heart disease Sister    Stomach cancer Sister    Colitis Sister     Past Surgical History:  Procedure Laterality Date   ANKLE SURGERY  2004   ANTERIOR CERVICAL DECOMP/DISCECTOMY FUSION  11/2007   [place]   lower; another scheduled, opt. reports 4 back- lumbar, 3 cerv. fusions  for later 2009   CARDIAC CATHETERIZATION  11/2009   Patent LIMA to LAD and patent SVG to OM1. Occluded SVG to ramus and diagonal. Left main: 90% ostial stenosis, LCX 60-70% proximal stenosis   CATARACT EXTRACTION W/ INTRAOCULAR LENS  IMPLANT, BILATERAL Bilateral 2006   COLONOSCOPY WITH PROPOFOL N/A 02/17/2017   Procedure: COLONOSCOPY WITH PROPOFOL;  Surgeon: Jerene Bears, MD;  Location[facility] ENDOSCOPY;  Service: Endoscopy;  Laterality: N/A;   CORONARY ANGIOPLASTY WITH STENT PLACEMENT  11/2009   Drug eluting stent to left main artery: 4.0 X 12 mm Ion    CORONARY ARTERY BYPASS GRAFT  2004   "CABG X4"   ESOPHAGOGASTRODUODENOSCOPY N/A 02/14/2017   Procedure: ESOPHAGOGASTRODUODENOSCOPY (EGD);  Surgeon: Doran Stabler, MD;  Location: [facility] ENDOSCOPY;  Service: Endoscopy;  Laterality: N/A;   GIVENS CAPSULE STUDY N/A 02/15/2017   Procedure: GIVENS CAPSULE STUDY;  Surgeon: Doran Stabler, MD;  Location: [place];  Service: Endoscopy;  Laterality: N/A;   GROIN MASS OPEN BIOPSY  2004   IR IVC FILTER PLMT / S&I Burke Keels GUID/MOD SED  03/14/2018   IR PARACENTESIS  03/10/2018   JOINT REPLACEMENT     LAPAROSCOPIC CHOLECYSTECTOMY      PERIPHERAL VASCULAR INTERVENTION Left 03/05/2018   Procedure: PERIPHERAL VASCULAR INTERVENTION;  Surgeon: Elam Dutch, MD;  Location: [place] CV LAB;  Service: Cardiovascular;  Laterality: Left;  Attempted unsuccess\ful Per Dr. Eden Lathe   PERIPHERAL VASCULAR INTERVENTION  03/08/2018   Procedure: PERIPHERAL VASCULAR INTERVENTION;  Surgeon: Waynetta Sandy, MD;  Location: [place] [facility];  Service: Cardiovascular;;  SMA Stent    REPLACEMENT TOTAL KNEE BILATERAL Bilateral 2012   REVERSE SHOULDER ARTHROPLASTY  02/26/2012   Procedure: REVERSE SHOULDER ARTHROPLASTY;  Surgeon: Marin Shutter, MD;  Location: [place];  Service: Orthopedics;  Laterality: Right;  RIGHT SHOULDER REVERSED ARTHROPLASTY   SHOULDER ARTHROSCOPY WITH SUBACROMIAL DECOMPRESSION Left 02/10/2013   Procedure: SHOULDER ARTHROSCOPY WITH SUBACROMIAL DECOMPRESSION DISTAL CLAVICLE RESECTION;  Surgeon: Marin Shutter, MD;  Location: [place];  Service: Orthopedics;  Laterality: Left;  DISTAL CLAVICLE RESECTION   TONSILLECTOMY     TOTAL HIP ARTHROPLASTY  2007   Right   TOTAL HIP ARTHROPLASTY Left 02/07/2020   Procedure: TOTAL HIP ARTHROPLASTY ANTERIOR APPROACH;  Surgeon: Paralee Cancel, MD;  Location: [facility];  Service: Orthopedics;  Laterality: Left;  70 mins   TRANSURETHRAL RESECTION OF BLADDER TUMOR N/A 05/31/2018   Procedure: TRANSURETHRAL RESECTION OF BLADDER TUMOR (TURBT);  Surgeon: Lucas Mallow, MD;  Location: [facility];  Service: Urology;  Laterality: N/A;   VISCERAL ANGIOGRAPHY N/A 03/05/2018   Procedure: VISCERAL ANGIOGRAPHY;  Surgeon: Elam Dutch, MD;  Location: [place] [facility];  Service: Cardiovascular;  Laterality: N/A;   VISCERAL ANGIOGRAPHY N/A 03/08/2018   Procedure: VISCERAL ANGIOGRAPHY;  Surgeon: Waynetta Sandy, MD;  Location: [place] [facility];  Service: Cardiovascular;  Laterality: N/A;   Social History   Occupational History   Occupation: retired    Comment: Electronics engineer:  RETIRED  Tobacco Use   Smoking status: Former    Packs/day: 1.00    Years: 45.00    Total pack years: 45.00    Types: Cigarettes    Quit date: 12/22/1998    Years since quitting: 24.1   Smokeless tobacco: Never  Vaping Use   Vaping Use: Never used  Substance and Sexual Activity   Alcohol use: Never    Alcohol/week: 0.0 standard drinks of alcohol   Drug use: Never   Sexual activity: Not Currently

## 2023-02-16 ENCOUNTER — Ambulatory Visit: Payer: PRIVATE HEALTH INSURANCE | Admitting: Physician Assistant

## 2023-02-16 ENCOUNTER — Other Ambulatory Visit: Payer: Self-pay | Admitting: Internal Medicine

## 2023-02-18 ENCOUNTER — Other Ambulatory Visit: Payer: Self-pay | Admitting: Internal Medicine

## 2023-03-01 NOTE — Progress Notes (Unsigned)
Cardiology Office Note   Date:  03/02/2023   ID:  [name], [place] 17-Nov-1941, MRN [id number]  PCP:  [name], MD  Cardiologist:   Dorris Carnes, MD   Pt presents for f/u of CAD    History of Present Illness: [name] is a 82 y.o. female with a history of CAD (s/p CABG 2004; DES to LM in 2010; myovue in 2018:  Low risk with distal anterior and apical ischemia), chronic diastolic CHF, CV dz, PAD recurrent DVT/PE, GI bleeding  when on anticoagulation (not on),  COPD, PAF (after CABG), GERD, HL, HTN, renal insufficiency, sleep apnea (not on CPAP)    Over the years she has been admitted for pneumonia, COPD exacerbations and decompensated HFpEF  July 2023  Admitted for RLL pneumonia     I last saw the pt in October 2023  The pt wore an event monitor after that visit   This showed the pt was in atrial flutter 100% of time   Not on anticoagulation due to bleeding risks (GI/AVMs) Not a good candidate for Watchman    Since seen she says her breathing is pretty good   She denies CP    No palpitations    Current Meds  Medication Sig   acetaminophen (TYLENOL) 500 MG tablet 1000 mg by oral route.   albuterol (PROVENTIL) (2.5 MG/3ML) 0.083% nebulizer solution Take 2.5 mg by nebulization every 6 (six) hours as needed for wheezing or shortness of breath.   allopurinol (ZYLOPRIM) 100 MG tablet Take 1 tablet (100 mg total) by mouth daily with breakfast.   amLODipine (NORVASC) 2.5 MG tablet Take 1/2 tablet as needed for BP greater than 160.   ARIPiprazole (ABILIFY) 2 MG tablet Take 2 mg by mouth daily with breakfast.   aspirin EC 81 MG tablet Take 81 mg by mouth daily with breakfast. Swallow whole.   atorvastatin (LIPITOR) 20 MG tablet TAKE 1 TABLET ONCE DAILY AT BEDTIME   cholecalciferol (VITAMIN D3) 25 MCG (1000 UNIT) tablet Take 1 tablet (1,000 Units total) by mouth daily with supper.   citalopram (CELEXA) 10 MG tablet TAKE 1 TABLET BY MOUTH ONCE DAILY   cyclobenzaprine  (FLEXERIL) 10 MG tablet Take 10 mg by mouth 3 (three) times daily as needed for muscle spasms.   dapagliflozin propanediol (FARXIGA) 10 MG TABS tablet Take 1 tablet (10 mg total) by mouth daily before breakfast.   Docusate Sodium (DSS) 100 MG CAPS Take 100 mg by mouth daily as needed (constipation).   ferrous sulfate 325 (65 FE) MG tablet TAKE 1 TABLET 2 TIMES PER DAY   fluticasone-salmeterol (ADVAIR DISKUS) 250-50 MCG/ACT AEPB Inhale 1 puff into the lungs in the morning and at bedtime.   furosemide (LASIX) 80 MG tablet Take 80 mg by mouth every other day.   gabapentin (NEURONTIN) 100 MG capsule Take 200 mg by mouth at bedtime.   Ipratropium-Albuterol (COMBIVENT RESPIMAT) 20-100 MCG/ACT AERS respimat Inhale 1 puff into the lungs 3 (three) times daily as needed for wheezing or shortness of breath.   levothyroxine (SYNTHROID) 50 MCG tablet TAKE 1 TABLET BY MOUTH DAILY BEFORE BREAKFAST.   magnesium gluconate (MAGONATE) 500 MG tablet Take 500 mg by mouth daily.   Melatonin 10 MG TABS Take 30 mg by mouth at bedtime.   metolazone (ZAROXOLYN) 2.5 MG tablet Take 1 tablet (2.5 mg total) by mouth once a week. Take 30 minutes before Lasix (furosemide).   metoprolol tartrate (LOPRESSOR) 25 MG tablet Take 0.5  tablets (12.5 mg total) by mouth 2 (two) times daily.   nitroGLYCERIN (NITROSTAT) 0.4 MG SL tablet Place 1 tablet (0.4 mg total) under the tongue every 5 (five) minutes as needed for chest pain. For chest pain   Omega-3 Fatty Acids (FISH OIL) 1000 MG CPDR Take 1,000 mg by mouth daily.   OXYGEN Inhale 4 L/min into the lungs as needed (DURING ALL TIMES OF EXERTION).    pantoprazole (PROTONIX) 40 MG tablet Take 40 mg by mouth daily.   pilocarpine (PILOCAR) 1 % ophthalmic solution Place 1 drop into both eyes 2 (two) times daily.   polyethylene glycol (MIRALAX / GLYCOLAX) 17 g packet Take 17 g by mouth as needed.   potassium chloride SA (KLOR-CON M) 20 MEQ tablet TAKE 1 TABLET ONCE DAILY WITH FOOD    pramipexole (MIRAPEX) 0.25 MG tablet Take 0.25 mg by mouth daily with supper.   roflumilast (DALIRESP) 500 MCG TABS tablet Take 500 mcg by mouth daily.   spironolactone (ALDACTONE) 25 MG tablet TAKE 1 TABLET BY MOUTH DAILY.   traZODone (DESYREL) 50 MG tablet Take 150 mg by mouth at bedtime.    vitamin C (ASCORBIC ACID) 500 MG tablet Take 500 mg by mouth daily with lunch.   XTAMPZA ER 9 MG C12A Take 9 mg by mouth as needed (Pain).     Allergies:   Ativan [lorazepam], Pitavastatin, Ropinirole, Zofran [ondansetron hcl], Zolpidem tartrate, Zolpidem tartrate, and Penicillins   Past Medical History:  Diagnosis Date   Anemia    bld. transfusion post lumbar surgery- 2012   Anxiety    Arthralgia    NOS   CAD (coronary artery disease)    s/p CABG 2004; s/p DES to LM in 2010;  [place] 10/29/11: EF 50-55%, mild elevated filling pressures, no pulmonary HTN, LM 90% ISR, LAD and CFX occluded, S-RI occluded (old), S-OM3 ok and L-LAD ok, native nondominant RCA 95% -  med rx recommended ; Lexiscan Myoview 7/13 at [facility]: demonstrated "normal LV function, anterior attenuation and localized ischemia, inferior, basilar, mid section"   Carotid artery disease ([place])    Carotid US [id number]:  RICA [id number]; LICA [id number]; R subclavian stenosis - Repeat 1 year. // Carotid US 05/2019: R 1-39; L 40-59; R vertebral with atypical antegrade flow; R subclavian stenosis >> repeat 1 year // Carotid US 7/21: Bilat 40-59; L subclavian stenosis // Carotid US 7/22: Bilat ICA 40-59; R subclavian stenosis   CHF (congestive heart failure) (HCC) 01/21/2020   Chronic diastolic heart failure (HCC)    Echo 9/10: EF [id number], grade 1 diastolic dysfunction   Chronic lower back pain    Chronic respiratory failure (HCC)    Cirrhosis (HCC)    CKD (chronic kidney disease), stage III (HCC)    COPD (chronic obstructive pulmonary disease) ([place])    Emphysema dxed by Dr. Woody Seller in [place] based on PFTs per pt in 2006; placed on albuterol   Degenerative  disc disease, lumbar    Depression    TAKES CELEXA  AND  (OFF- WELBUTRIN)   Diuretic-induced hypokalemia 10/08/2018   DVT of lower extremity (deep venous thrombosis) (HCC)    recurrent. bilateral (2 episodes)   Dyspnea    home o2 when needed   Dysrhythmia    afib with cabg   Exertional angina    Treated with Isosorbide, Ranexa, amlodipine; intolerant to metoprolol   GERD (gastroesophageal reflux disease)    Gout    "on daily RX" (10/06/2018)   HLD (hyperlipidemia)  Hyperlipidemia    Hypertension    Hypothyroidism    L Subclavian Artery Stenosis    Carotid US 7/21    Obesity (BMI 30-39.9) 2009   BMI 33   On home oxygen therapy    OSA (obstructive sleep apnea)    Osteoarthritis    "all over; hands" (10/06/2018)   Osteoporosis    Oxygen dependent    4L at home as needed (10/06/2018)   PVD (peripheral vascular disease) ([place])    Restless leg syndrome    Vitamin D deficiency     Past Surgical History:  Procedure Laterality Date   ANKLE SURGERY  2004   ANTERIOR CERVICAL DECOMP/DISCECTOMY FUSION  11/2007   [place]   lower; another scheduled, opt. reports 4 back- lumbar, 3 cerv. fusions  for later 2009   CARDIAC CATHETERIZATION  11/2009   Patent LIMA to LAD and patent SVG to OM1. Occluded SVG to ramus and diagonal. Left main: 90% ostial stenosis, LCX 60-70% proximal stenosis   CATARACT EXTRACTION W/ INTRAOCULAR LENS  IMPLANT, BILATERAL Bilateral 2006   COLONOSCOPY WITH PROPOFOL N/A 02/17/2017   Procedure: COLONOSCOPY WITH PROPOFOL;  Surgeon: Jerene Bears, MD;  Location: [facility] ENDOSCOPY;  Service: Endoscopy;  Laterality: N/A;   CORONARY ANGIOPLASTY WITH STENT PLACEMENT  11/2009   Drug eluting stent to left main artery: 4.0 X 12 mm Ion    CORONARY ARTERY BYPASS GRAFT  2004   "CABG X4"   ESOPHAGOGASTRODUODENOSCOPY N/A 02/14/2017   Procedure: ESOPHAGOGASTRODUODENOSCOPY (EGD);  Surgeon: Doran Stabler, MD;  Location: [facility] ENDOSCOPY;   Service: Endoscopy;  Laterality: N/A;   GIVENS CAPSULE STUDY N/A 02/15/2017   Procedure: GIVENS CAPSULE STUDY;  Surgeon: Doran Stabler, MD;  Location: [place];  Service: Endoscopy;  Laterality: N/A;   GROIN MASS OPEN BIOPSY  2004   IR IVC FILTER PLMT / S&I /IMG GUID/MOD SED  03/14/2018   IR PARACENTESIS  03/10/2018   JOINT REPLACEMENT     LAPAROSCOPIC CHOLECYSTECTOMY     PERIPHERAL VASCULAR INTERVENTION Left 03/05/2018   Procedure: PERIPHERAL VASCULAR INTERVENTION;  Surgeon: Elam Dutch, MD;  Location: [place] [facility];  Service: Cardiovascular;  Laterality: Left;  Attempted unsuccess\ful Per Dr. Eden Lathe   PERIPHERAL VASCULAR INTERVENTION  03/08/2018   Procedure: PERIPHERAL VASCULAR INTERVENTION;  Surgeon: Waynetta Sandy, MD;  Location: [place] [facility];  Service: Cardiovascular;;  SMA Stent    REPLACEMENT TOTAL KNEE BILATERAL Bilateral 2012   REVERSE SHOULDER ARTHROPLASTY  02/26/2012   Procedure: REVERSE SHOULDER ARTHROPLASTY;  Surgeon: Marin Shutter, MD;  Location: [place];  Service: Orthopedics;  Laterality: Right;  RIGHT SHOULDER REVERSED ARTHROPLASTY   SHOULDER ARTHROSCOPY WITH SUBACROMIAL DECOMPRESSION Left 02/10/2013   Procedure: SHOULDER ARTHROSCOPY WITH SUBACROMIAL DECOMPRESSION DISTAL CLAVICLE RESECTION;  Surgeon: Marin Shutter, MD;  Location: [place];  Service: Orthopedics;  Laterality: Left;  DISTAL CLAVICLE RESECTION   TONSILLECTOMY     TOTAL HIP ARTHROPLASTY  2007   Right   TOTAL HIP ARTHROPLASTY Left 02/07/2020   Procedure: TOTAL HIP ARTHROPLASTY ANTERIOR APPROACH;  Surgeon: Paralee Cancel, MD;  Location: [facility];  Service: Orthopedics;  Laterality: Left;  70 mins   TRANSURETHRAL RESECTION OF BLADDER TUMOR N/A 05/31/2018   Procedure: TRANSURETHRAL RESECTION OF BLADDER TUMOR (TURBT);  Surgeon: Lucas Mallow, MD;  Location: [facility];  Service: Urology;  Laterality: N/A;   VISCERAL ANGIOGRAPHY N/A 03/05/2018   Procedure: VISCERAL ANGIOGRAPHY;  Surgeon:  Elam Dutch, MD;  Location: [place] [facility];  Service: Cardiovascular;  Laterality: N/A;   VISCERAL ANGIOGRAPHY N/A 03/08/2018   Procedure: VISCERAL ANGIOGRAPHY;  Surgeon: Waynetta Sandy, MD;  Location: [place] [facility];  Service: Cardiovascular;  Laterality: N/A;     Social History:  The patient  reports that she quit smoking about 24 years ago. Her smoking use included cigarettes. She has a 45.00 pack-year smoking history. She has never used smokeless tobacco. She reports that she does not drink alcohol and does not use drugs.   Family History:  The patient's family history includes COPD in her father; Colitis in her sister; Heart disease in her father and sister; Lung cancer in her father; Ovarian cancer in her mother; Stomach cancer in her sister.    ROS:  Please see the history of present illness. All other systems are reviewed and  Negative to the above problem except as noted.    PHYSICAL EXAM: VS:  BP 130/62   Pulse 68   Ht '5\' 7"'$  (1.702 m)   Wt 156 lb 3.2 oz (70.9 kg)   SpO2 98%   BMI 24.46 kg/m   GEN: Well nourished, in no acute distress   Examined in chair Neck: no obvious JVD, Cardiac: RRR     Gr Ii/VI systolic murmur (early peaking ) at base  Trace Le edema.  Respiratory:  moving air   Occsaoinal pop, wheeze GI: soft, nontender, no definite hepatomegaly    EKG:  EKG is not done today     Lipid Panel    Component Value Date/Time   CHOL 104 02/24/2022 0912   CHOL 98 05/23/2014 0857   TRIG 45 02/24/2022 0912   TRIG 83 05/23/2014 0857   HDL 50 02/24/2022 0912   HDL 35 (L) 05/23/2014 0857   CHOLHDL 2.1 02/24/2022 0912   CHOLHDL 1.7 05/10/2021 2305   VLDL 4 05/10/2021 2305   LDLCALC 43 02/24/2022 0912   LDLCALC 46 05/23/2014 0857      Wt Readings from Last 3 Encounters:  03/02/23 156 lb 3.2 oz (70.9 kg)  02/10/23 150 lb (68 kg)  01/26/23 153 lb 12.8 oz (69.8 kg)      ASSESSMENT AND PLAN:  1  CAD   Remote CABG in 2004   DES to LM in 2010    Myoview 2018   Low risk Pt without symptoms of angina   She is not very active    Follow      2  Atrial fib/flutter   PT with hx of Afib after CABG    An event monitor in Oct 2023 showed 100% atrial flutter  With hx of GI bleeding in past (2019) not a good candidate for anticoagluation Not a good candidate for Watchman   Pt understands      3  Hx HFpEF   Volume is appears OK now    4  CV dz   Mod CV dz on USN in July 2023   Follow up this summer    5  Hx DVT / PE  2019  Multiple bilateral PEs, L femoral DVT  Anticoagulation d/c'd due to Hx GI bleeding, large abdomenal wall hematoma  Underwent IVC filter    6 HTN   BP is good   7  HL  Will check lipids today   Labs today   CBC , lipid, BNP, BMET     Follow up in October   6  CKD  Cr 1.39 in July     Will  follow up in clnic this winter   Current medicines are reviewed at length with the patient today.  The patient does not have concerns regarding medicines.  Signed, Dorris Carnes, MD  03/02/2023 11:12 AM    [address], [place], [place]  [address] Phone: [phone]; Fax: [phone]

## 2023-03-02 ENCOUNTER — Ambulatory Visit: Payer: Medicare HMO | Attending: Internal Medicine | Admitting: Internal Medicine

## 2023-03-02 ENCOUNTER — Encounter: Payer: Self-pay | Admitting: Internal Medicine

## 2023-03-02 VITALS — BP 130/62 | HR 68 | Ht 67.0 in | Wt 156.2 lb

## 2023-03-02 DIAGNOSIS — Z79899 Other long term (current) drug therapy: Secondary | ICD-10-CM | POA: Diagnosis not present

## 2023-03-02 DIAGNOSIS — I25118 Atherosclerotic heart disease of native coronary artery with other forms of angina pectoris: Secondary | ICD-10-CM

## 2023-03-02 DIAGNOSIS — I5032 Chronic diastolic (congestive) heart failure: Secondary | ICD-10-CM

## 2023-03-02 DIAGNOSIS — I4891 Unspecified atrial fibrillation: Secondary | ICD-10-CM | POA: Diagnosis not present

## 2023-03-02 DIAGNOSIS — I10 Essential (primary) hypertension: Secondary | ICD-10-CM

## 2023-03-02 DIAGNOSIS — E782 Mixed hyperlipidemia: Secondary | ICD-10-CM | POA: Diagnosis not present

## 2023-03-02 LAB — CBC

## 2023-03-02 NOTE — Patient Instructions (Signed)
Medication Instructions:   *If you need a refill on your cardiac medications before your next appointment, please call your pharmacy*   Lab Work: CBC, BMET, LIPID, PRO BNP If you have labs (blood work) drawn today and your tests are completely normal, you will receive your results only by: [place] (if you have MyChart) OR A paper copy in the mail If you have any lab test that is abnormal or we need to change your treatment, we will call you to review the results.   Testing/Procedures:    Follow-Up: At [facility], you and your health needs are our priority.  As part of our continuing mission to provide you with exceptional heart care, we have created designated Provider Care Teams.  These Care Teams include your primary Cardiologist (physician) and Advanced Practice Providers (APPs -  Physician Assistants and Nurse Practitioners) who all work together to provide you with the care you need, when you need it.  We recommend signing up for the patient portal called "MyChart".  Sign up information is provided on this After Visit Summary.  MyChart is used to connect with patients for Virtual Visits (Telemedicine).  Patients are able to view lab/test results, encounter notes, upcoming appointments, etc.  Non-urgent messages can be sent to your provider as well.   To learn more about what you can do with MyChart, go to [email or web address].    Your next appointment:   7 month(s)  Provider:   Dorris Carnes, MD     Other Instructions

## 2023-03-03 LAB — LIPID PANEL
Chol/HDL Ratio: 2.4 ratio (ref 0.0–4.4)
Cholesterol, Total: 95 mg/dL — ABNORMAL LOW (ref 100–199)
HDL: 40 mg/dL (ref >39)
LDL Chol Calc (NIH): 43 mg/dL (ref 0–99)
Triglycerides: 50 mg/dL (ref 0–149)
VLDL Cholesterol Cal: 12 mg/dL (ref 5–40)

## 2023-03-03 LAB — PRO B NATRIURETIC PEPTIDE: NT-Pro BNP: 1203 pg/mL — ABNORMAL HIGH (ref 0–738)

## 2023-03-03 LAB — CBC
Hematocrit: 38.6 % (ref 34.0–46.6)
Hemoglobin: 12.8 g/dL (ref 11.1–15.9)
MCH: 28.7 pg (ref 26.6–33.0)
MCHC: 33.2 g/dL (ref 31.5–35.7)
MCV: 87 fL (ref 79–97)
Platelets: 238 10*3/uL (ref 150–450)
RBC: 4.46 x10E6/uL (ref 3.77–5.28)
RDW: 14.7 % (ref 11.7–15.4)
WBC: 8.7 10*3/uL (ref 3.4–10.8)

## 2023-03-03 LAB — BASIC METABOLIC PANEL
BUN/Creatinine Ratio: 19 (ref 12–28)
BUN: 25 mg/dL (ref 8–27)
CO2: 34 mmol/L — ABNORMAL HIGH (ref 20–29)
Calcium: 9.2 mg/dL (ref 8.7–10.3)
Chloride: 96 mmol/L (ref 96–106)
Creatinine, Ser: 1.32 mg/dL — ABNORMAL HIGH (ref 0.57–1.00)
Glucose: 98 mg/dL (ref 70–99)
Potassium: 4.7 mmol/L (ref 3.5–5.2)
Sodium: 140 mmol/L (ref 134–144)
eGFR: 40 mL/min/{1.73_m2} — ABNORMAL LOW (ref >59)

## 2023-03-04 ENCOUNTER — Other Ambulatory Visit: Payer: Self-pay | Admitting: Internal Medicine

## 2023-03-18 ENCOUNTER — Other Ambulatory Visit: Payer: Self-pay | Admitting: Internal Medicine

## 2023-03-23 ENCOUNTER — Other Ambulatory Visit: Payer: Self-pay | Admitting: Internal Medicine

## 2023-03-24 ENCOUNTER — Other Ambulatory Visit: Payer: Self-pay | Admitting: Internal Medicine

## 2023-03-24 MED ORDER — XTAMPZA ER 9 MG PO C12A: 9.0000 mg | EXTENDED_RELEASE_CAPSULE | Freq: Three times a day (TID) | ORAL | 0 refills | Status: DC

## 2023-03-27 ENCOUNTER — Other Ambulatory Visit: Payer: Self-pay | Admitting: Internal Medicine

## 2023-03-27 DIAGNOSIS — I471 Supraventricular tachycardia, unspecified: Secondary | ICD-10-CM

## 2023-04-08 ENCOUNTER — Other Ambulatory Visit: Payer: Self-pay | Admitting: Internal Medicine

## 2023-04-13 ENCOUNTER — Ambulatory Visit: Payer: Medicare HMO | Admitting: Internal Medicine

## 2023-04-17 ENCOUNTER — Other Ambulatory Visit: Payer: Self-pay | Admitting: Internal Medicine

## 2023-04-27 ENCOUNTER — Ambulatory Visit: Payer: Medicare HMO | Admitting: Internal Medicine

## 2023-04-28 ENCOUNTER — Other Ambulatory Visit: Payer: Self-pay | Admitting: Internal Medicine

## 2023-04-30 ENCOUNTER — Encounter: Payer: Self-pay | Admitting: Internal Medicine

## 2023-04-30 ENCOUNTER — Ambulatory Visit: Payer: Medicare HMO | Admitting: Internal Medicine

## 2023-04-30 VITALS — BP 162/92 | HR 110 | Temp 98.7°F | Resp 16 | Ht 65.0 in | Wt 156.0 lb

## 2023-04-30 DIAGNOSIS — G894 Chronic pain syndrome: Secondary | ICD-10-CM | POA: Diagnosis not present

## 2023-04-30 DIAGNOSIS — G8929 Other chronic pain: Secondary | ICD-10-CM | POA: Insufficient documentation

## 2023-04-30 DIAGNOSIS — M25561 Pain in right knee: Secondary | ICD-10-CM

## 2023-04-30 DIAGNOSIS — R413 Other amnesia: Secondary | ICD-10-CM | POA: Diagnosis not present

## 2023-04-30 DIAGNOSIS — I480 Paroxysmal atrial fibrillation: Secondary | ICD-10-CM

## 2023-04-30 DIAGNOSIS — M25562 Pain in left knee: Secondary | ICD-10-CM | POA: Insufficient documentation

## 2023-04-30 MED ORDER — DONEPEZIL HCL 10 MG PO TABS: 10.0000 mg | ORAL_TABLET | Freq: Every day | ORAL | 1 refills | Status: DC

## 2023-04-30 MED ORDER — XTAMPZA ER 9 MG PO C12A: 9.0000 mg | EXTENDED_RELEASE_CAPSULE | Freq: Three times a day (TID) | ORAL | 0 refills | Status: DC

## 2023-04-30 MED ORDER — DONEPEZIL HCL 5 MG PO TABS: 5.0000 mg | ORAL_TABLET | Freq: Every day | ORAL | 0 refills | Status: DC

## 2023-04-30 NOTE — Assessment & Plan Note (Signed)
I reviewed her [place] controlled substance registry.  Her Xtampza does control her other pain and plan below regarding her right knee.  We will refill her Vonzella Nipple for 3 months.

## 2023-04-30 NOTE — Assessment & Plan Note (Signed)
I am going to start her on aricept 5mg  x 1 month, then increase to 10mg  nightly.  We will check another MMSE in 3 months.

## 2023-04-30 NOTE — Assessment & Plan Note (Signed)
I spoke to Dr. Welton Flakes in pain management.  Ortho does not want to do a right knee revision.  She can try to continue to use voltaren gel and we will see if we can use a gabapentin compounded cream if her insurance will allow [place].  She can also try to increase her Xtampza from BID to TID dosing.  This pain only occurs when she is up on her feet.

## 2023-04-30 NOTE — Progress Notes (Signed)
Office Visit  Subjective   Patient ID: [name]   DOB: 24-Jan-1941   Age: 82 y.o.   MRN: [id number]   Chief Complaint Chief Complaint  Patient presents with   Follow-up    Pain     History of Present Illness [name] is a 82 yo female who returns today for followup of her chronic pain syndrome.  Since her last visit, she states that she is having worsening pain of her chronic right knee pain.  She did see Dr. Ophelia Charter in ortho in 01/2023 where he noted she has had persistent knee patellar pain since her right knee arthroplasty done in [place] 2007 by Dr.Feder. She had 1 single injection of cortisone in 09/2022 that gave her some relief and Dr. Magnus Ivan discussed with her that repetitive cortisone injections are not a good idea with risks of infection.   X-rays have shown persistent lateral osteophyte or lateral patellofemoral ligament calcification with small crack which looks like a small avulsion.  She was told about this up to 4 years ago and it has been persistent.  Sometimes she notes some mild swelling in her knee.  She has had aspiration in the past which showed no evidence of infection.  Bone scan showed slight increased uptake around the patella.  Serial x-rays showed no evidence of patellar component loosening.  Her pain seems to be worse when she is on her knee more.  Dr. Ophelia Charter discussed with her that he thinks that a patellar revision will unlikely give her pain relief.  This component appears to be well-fixed and he wants her to use a short knee immobilizer that she can wear intermittently to take some pressure off the knee.  She has previously tried a copper sleeve without resolution.  She has chronic pain due to low back pain as well as pain from her history of bilateral knee replacements and bilateral shoulders.   This past year, we did switch her oxycodone IR over to [facility] ER 9mg  po TID.  However some days she only uses this BID.  This seems to control her pain except for  her right knee.  We noted she had acute pain in 2022 where she was having worsening pain with weakness and fatigue.  I thought these symptoms were a combination from recovering from COVID-19 and depression.  She did improve at that time.  Due to her acute pain as described , I spoke to Dr. Welton Flakes in Pain Management around 08/2021 where we decided to hold her Xtampza for a few weeks and put her on short acting oxycodone 5mg  po q 6 hrs prn.  I did restart her on celexa and her depression improved at that time.  [name] surgery on 01/2020.  She was on gabapentin 200mg  BID for pain as well but is no longer on this. Her chronic pain effects her neck, shoulders, lower back, hips and her knees.  She has seen Dr. Welton Flakes in pain management who recommended placing her on oxycodone ER 10mg  po BID.  The patient could not afford this so we switched her to oxycodone 10mg  po q 6 hr prn which she does take 4 times per day.  She states she could not function with her day to day activites without her pain medications.  She has been on MS contin and oxycodone ER in the past.  She tells me that this medication gives her fatigue and all she wants to do  is sit down and do nothing and is not able to do alot of her ADL's.  She was previously on percocet 10/325mg  po q 6hr prn (which she took 4 times per day) before the MS contin and was on this for years.  She was on long term benzos in the past but again we stopped her off of these.  The patient has seen seen Dr. Edwyna Perfect (orthopedics) in [place] and Dr. Wynetta Emery (neurosurgery).  She remains on cyclobenzaprine as a muscle relaxant.  The patient had had left shoulder surgery in 2014 due to impingement syndrome and a chronic rotator cuff tear and labral tear where they performed a debridement and synvectomy.  This did not correct her shoulder but did reduce the amount of pain she has had in that arm/shoulder.  Her back pain has remained the same.  She  also has chronic back pain as well as pain from her history of bilateral knee replacements and bilateral shoulders.   She continues to have pain in both of her knees when she gets up.  She is still using a cane and no longer uses a walker for longer distances.  The patient denies any falls.  She does use a cane to get around and has [employer].  In regards to her chronic lower back pain, her pain mostly affects her when she stands up.  The pain is an intermittent dull aching she rates as severe at times but again improves with sitting and medications.  There is no pain radiation.  She had back surgery 2012 where her neurosurgeon noted she had worsening back pain with progressive break down of L1-L2 above the levels of her L2-L5 fusion in the past.  They did do surgery and removed some of her hardware.  She has tried PT and pool therapy which she states did not help.  Her pain medicine allows her to do her ADL's including cleaning her house and shopping.  The patient was tried on cymbalta in the past but this made her having nausea and vomiting.  There is no loss of bowel/bladder function or new weakness or numbness.  Her last dose of Xtampza was last night.    I did see [name] in 01/2023 for followup of  PSVT where I saw her 2 weeks prior and noted she had an irregular rhythm where we did an EKG that showed supraventricular tachycardia with a HR of 113.  I reviewed cardiology's notes from 09/2022 and they did a zio patch at that time that showed 100% atrial flutter.  They did not add any medications at that time as she was asymptomatic and her HR was controlled.  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   She was noted to have paroxysmal A. Fib when she had pneumonia in the past. I did start her on low dose metoprolol 12.5mg  BID for this PSVT which did control her rate.  She did have followup with cardiology in 02/2023 and they did not change any of her meds and she was noted to be  stable at that time.    We are also following Mrs. Schuldt for some memory loss where we saw her in 12/2022.  Her daughter stated they had noted some memory changes that occurred since 11/2022.  She had some difficulty yesterday remembering her daughter and difficulty remembering to buckle her seat belt.  She is forgetting words as she is starting to talk.  Again, the daugther states  that there is no worsening depression where we noted this was controlled in 12/2022.  Her MMSE 04/2022 was 28/30 and in 12/2022 she scored a 24/30.  We did lab testing at that time and her labs were normal except for her CKD and her TSH was a bit elevated.     Past Medical History Past Medical History:  Diagnosis Date   Anemia    bld. transfusion post lumbar surgery- 2012   Anxiety    Arthralgia    NOS   CAD (coronary artery disease)    s/p CABG 2004; s/p DES to LM in 2010;  LHC 10/29/11: EF 50-55%, mild elevated filling pressures, no pulmonary HTN, LM 90% ISR, LAD and CFX occluded, S-RI occluded (old), S-OM3 ok and L-LAD ok, native nondominant RCA 95% -  med rx recommended ; Lexiscan Myoview 7/13 at [facility]: demonstrated "normal LV function, anterior attenuation and localized ischemia, inferior, basilar, mid section"   Carotid artery disease (HCC)    Carotid US 6/19:  RICA 1-39; LICA 40-59; R subclavian stenosis - Repeat 1 year. // Carotid US 05/2019: R 1-39; L 40-59; R vertebral with atypical antegrade flow; R subclavian stenosis >> repeat 1 year // Carotid US 7/21: Bilat 40-59; L subclavian stenosis // Carotid US 7/22: Bilat ICA 40-59; R subclavian stenosis   CHF (congestive heart failure) (HCC) 01/21/2020   Chronic diastolic heart failure (HCC)    Echo 9/10: EF 60-55%, grade 1 diastolic dysfunction   Chronic lower back pain    Chronic respiratory failure (HCC)    Cirrhosis (HCC)    CKD (chronic kidney disease), stage III (HCC)    COPD (chronic obstructive pulmonary disease) (HCC)    Emphysema dxed by Dr.  Sherril Croon in [place] based on PFTs per pt in 2006; placed on albuterol   Degenerative disc disease, lumbar    Depression    TAKES CELEXA  AND  (OFF- WELBUTRIN)   Diuretic-induced hypokalemia 10/08/2018   DVT of lower extremity (deep venous thrombosis) (HCC)    recurrent. bilateral (2 episodes)   Dyspnea    home o2 when needed   Dysrhythmia    afib with cabg   Exertional angina    Treated with Isosorbide, Ranexa, amlodipine; intolerant to metoprolol   GERD (gastroesophageal reflux disease)    Gout    "on daily RX" (10/06/2018)   HLD (hyperlipidemia)    Hyperlipidemia    Hypertension    Hypothyroidism    L Subclavian Artery Stenosis    Carotid US 7/21    Obesity (BMI 30-39.9) 2009   BMI 33   On home oxygen therapy    OSA (obstructive sleep apnea)    Osteoarthritis    "all over; hands" (10/06/2018)   Osteoporosis    Oxygen dependent    4L at home as needed (10/06/2018)   PVD (peripheral vascular disease) (HCC)    Restless leg syndrome    Vitamin D deficiency      Allergies Allergies  Allergen Reactions   Ativan [Lorazepam] Other (See Comments)    Confusion    Pitavastatin Itching and Other (See Comments)    Reaction to Livalo   Ropinirole Nausea Only and Other (See Comments)    Makes legs jump, also   Zofran [Ondansetron Hcl] Nausea Only   Zolpidem Tartrate Anxiety and Other (See Comments)    CONFUSION    Zolpidem Tartrate Swelling    CONFUSION   Penicillins Rash and Other (See Comments)    Tolerated cephalosporins in past Has  patient had a PCN reaction causing immediate rash, facial/tongue/throat swelling, SOB or lightheadedness with hypotension: Yes Has patient had a PCN reaction causing severe rash involving mucus membranes or skin necrosis: No Has patient had a PCN reaction that required hospitalization: No Has patient had a PCN reaction occurring within the last 10 years: No If all of the above answers are "NO", then may proceed with Cephalosporin use.       Medications  Current Outpatient Medications:    acetaminophen (TYLENOL) 500 MG tablet, 1000 mg by oral route., Disp: , Rfl:    albuterol (PROVENTIL) (2.5 MG/3ML) 0.083% nebulizer solution, Take 2.5 mg by nebulization every 6 (six) hours as needed for wheezing or shortness of breath., Disp: , Rfl:    allopurinol (ZYLOPRIM) 100 MG tablet, Take 1 tablet (100 mg total) by mouth daily with breakfast., Disp: 90 tablet, Rfl: 1   amLODipine (NORVASC) 2.5 MG tablet, Take 1/2 tablet as needed for BP greater than 160., Disp: 30 tablet, Rfl: 1   ARIPiprazole (ABILIFY) 2 MG tablet, TAKE 1 TABLET BY MOUTH ONCE DAILY, Disp: 90 tablet, Rfl: 0   aspirin EC 81 MG tablet, Take 81 mg by mouth daily with breakfast. Swallow whole., Disp: , Rfl:    atorvastatin (LIPITOR) 20 MG tablet, TAKE 1 TABLET ONCE DAILY AT BEDTIME, Disp: 90 tablet, Rfl: 0   cholecalciferol (VITAMIN D3) 25 MCG (1000 UNIT) tablet, Take 1 tablet (1,000 Units total) by mouth daily with supper., Disp: 90 tablet, Rfl: 1   citalopram (CELEXA) 10 MG tablet, TAKE 1 TABLET BY MOUTH ONCE DAILY, Disp: 90 tablet, Rfl: 0   cyclobenzaprine (FLEXERIL) 10 MG tablet, Take 10 mg by mouth 3 (three) times daily as needed for muscle spasms., Disp: , Rfl:    dapagliflozin propanediol (FARXIGA) 10 MG TABS tablet, Take 1 tablet (10 mg total) by mouth daily before breakfast., Disp: 90 tablet, Rfl: 3   Docusate Sodium (DSS) 100 MG CAPS, Take 100 mg by mouth daily as needed (constipation)., Disp: , Rfl:    [START ON 05/30/2023] donepezil (ARICEPT) 10 MG tablet, Take 1 tablet (10 mg total) by mouth at bedtime., Disp: 30 tablet, Rfl: 1   donepezil (ARICEPT) 5 MG tablet, Take 1 tablet (5 mg total) by mouth at bedtime., Disp: 30 tablet, Rfl: 0   ferrous sulfate 325 (65 FE) MG tablet, TAKE 1 TABLET 2 TIMES PER DAY, Disp: 180 tablet, Rfl: 0   fluticasone-salmeterol (ADVAIR DISKUS) 250-50 MCG/ACT AEPB, Inhale 1 puff into the lungs in the morning and at bedtime., Disp: 1 each, Rfl:  5   furosemide (LASIX) 80 MG tablet, Take 80 mg by mouth every other day., Disp: , Rfl:    gabapentin (NEURONTIN) 100 MG capsule, TAKE 2 CAPSULES BY MOUTH TWICE DAILY, Disp: 360 capsule, Rfl: 2   Ipratropium-Albuterol (COMBIVENT RESPIMAT) 20-100 MCG/ACT AERS respimat, Inhale 1 puff into the lungs 3 (three) times daily as needed for wheezing or shortness of breath., Disp: , Rfl:    levothyroxine (SYNTHROID) 50 MCG tablet, TAKE 1 TABLET BY MOUTH DAILY BEFORE BREAKFAST., Disp: 90 tablet, Rfl: 3   magnesium gluconate (MAGONATE) 500 MG tablet, Take 500 mg by mouth daily., Disp: , Rfl:    Magnesium Oxide -Mg Supplement 500 MG CAPS, TAKE 1 CAPSULE BY MOUTH DAILY, Disp: 90 capsule, Rfl: 0   Melatonin 10 MG TABS, Take 30 mg by mouth at bedtime., Disp: , Rfl:    metolazone (ZAROXOLYN) 2.5 MG tablet, Take 1 tablet (2.5 mg total) by mouth  once a week. Take 30 minutes before Lasix (furosemide)., Disp: 30 tablet, Rfl: 0   metoprolol tartrate (LOPRESSOR) 25 MG tablet, TAKE 1/2 TABLET BY MOUTH TWICE DAILY, Disp: 15 tablet, Rfl: 2   nitroGLYCERIN (NITROSTAT) 0.4 MG SL tablet, Place 1 tablet (0.4 mg total) under the tongue every 5 (five) minutes as needed for chest pain. For chest pain, Disp: 25 tablet, Rfl: 3   Omega-3 Fatty Acids (FISH OIL) 1000 MG CPDR, Take 1,000 mg by mouth daily., Disp: , Rfl:    OXYGEN, Inhale 4 L/min into the lungs as needed (DURING ALL TIMES OF EXERTION). , Disp: , Rfl:    pantoprazole (PROTONIX) 40 MG tablet, TAKE 1 TABLET ONCE DAILY, Disp: 90 tablet, Rfl: 2   pilocarpine (PILOCAR) 1 % ophthalmic solution, Place 1 drop into both eyes 2 (two) times daily., Disp: , Rfl:    polyethylene glycol (MIRALAX / GLYCOLAX) 17 g packet, Take 17 g by mouth as needed., Disp: , Rfl:    potassium chloride SA (KLOR-CON M) 20 MEQ tablet, TAKE 1 TABLET ONCE DAILY WITH FOOD, Disp: 90 tablet, Rfl: 0   pramipexole (MIRAPEX) 0.25 MG tablet, TAKE 1 TABLET 2-3 HOURS BEFORE BEDTIME, Disp: 90 tablet, Rfl: 0    roflumilast (DALIRESP) 500 MCG TABS tablet, TAKE 1 TABLET ONCE DAILY, Disp: 90 tablet, Rfl: 0   spironolactone (ALDACTONE) 25 MG tablet, TAKE 1 TABLET BY MOUTH DAILY., Disp: 90 tablet, Rfl: 2   traZODone (DESYREL) 50 MG tablet, TAKE 3 TABLETS EVERY NIGHT AT BEDTIME., Disp: 270 tablet, Rfl: 0   vitamin C (ASCORBIC ACID) 500 MG tablet, Take 500 mg by mouth daily with lunch., Disp: , Rfl:    XTAMPZA ER 9 MG C12A, Take 9 mg by mouth in the morning, at noon, and at bedtime., Disp: 90 capsule, Rfl: 0   Review of Systems Review of Systems  Constitutional:  Negative for chills and fever.  Eyes:  Negative for blurred vision and double vision.  Respiratory:  Negative for shortness of breath and wheezing.   Cardiovascular:  Negative for chest pain, palpitations and leg swelling.  Gastrointestinal:  Negative for abdominal pain, constipation, diarrhea, nausea and vomiting.  Musculoskeletal:  Positive for joint pain. Negative for myalgias.  Skin:  Negative for itching and rash.  Neurological:  Negative for dizziness, weakness and headaches.  Psychiatric/Behavioral:         She states her depression and anxeity are controlled.       Objective:    Vitals BP (!) 162/92   Pulse (!) 110   Temp 98.7 F (37.1 C)   Resp 16   Ht 5\' 5"  (1.651 m)   Wt 156 lb (70.8 kg)   SpO2 90%   BMI 25.96 kg/m    Physical Examination Physical Exam Constitutional:      Appearance: Normal appearance. She is not ill-appearing.  Cardiovascular:     Rate and Rhythm: Normal rate and regular rhythm.     Pulses: Normal pulses.     Heart sounds: No murmur heard.    No friction rub. No gallop.  Pulmonary:     Effort: Pulmonary effort is normal. No respiratory distress.     Breath sounds: No wheezing, rhonchi or rales.  Abdominal:     General: Bowel sounds are normal. There is no distension.     Palpations: Abdomen is soft.     Tenderness: There is no abdominal tenderness.  Musculoskeletal:     Right lower leg: No  edema.  Left lower leg: No edema.  Skin:    General: Skin is warm and dry.     Findings: No rash.  Neurological:     Mental Status: She is alert.        Assessment & Plan:   Paroxysmal atrial fibrillation [facility]) She did see cardiology for her A. Fib a few months ago and she is not a candidate for anticoagulation or for a watchman's procedure.  We will continue her current meds and ASA 81mg  daily.  Chronic pain of right knee I spoke to Dr. Welton Flakes in pain management.  Ortho does not want to do a right knee revision.  She can try to continue to use voltaren gel and we will see if we can use a gabapentin compounded cream if her insurance will allow [place].  She can also try to increase her Xtampza from BID to TID dosing.  This pain only occurs when she is up on her feet.  Chronic pain I reviewed her [place] controlled substance registry.  Her Xtampza does control her other pain and plan below regarding her right knee.  We will refill her Vonzella Nipple for 3 months.  Memory loss I am going to start her on aricept 5mg  x 1 month, then increase to 10mg  nightly.  We will check another MMSE in 3 months.    Return in about 3 months (around 07/31/2023) for annual.   Crist Fat, MD

## 2023-04-30 NOTE — Assessment & Plan Note (Signed)
She did see cardiology for her A. Fib a few months ago and she is not a candidate for anticoagulation or for a watchman's procedure.  We will continue her current meds and ASA 81mg  daily.

## 2023-05-06 ENCOUNTER — Other Ambulatory Visit: Payer: Self-pay | Admitting: Internal Medicine

## 2023-05-09 ENCOUNTER — Encounter (HOSPITAL_COMMUNITY): Payer: Self-pay

## 2023-05-09 ENCOUNTER — Emergency Department (HOSPITAL_COMMUNITY): Payer: Medicare HMO

## 2023-05-09 ENCOUNTER — Other Ambulatory Visit: Payer: Self-pay

## 2023-05-09 ENCOUNTER — Inpatient Hospital Stay (HOSPITAL_COMMUNITY)
Admission: EM | Admit: 2023-05-09 | Discharge: 2023-05-15 | DRG: 190 | Disposition: A | Discharge status: Home Health | Payer: Medicare HMO | Attending: Family Medicine | Admitting: Family Medicine

## 2023-05-09 DIAGNOSIS — E1122 Type 2 diabetes mellitus with diabetic chronic kidney disease: Secondary | ICD-10-CM | POA: Diagnosis present

## 2023-05-09 DIAGNOSIS — Z888 Allergy status to other drugs, medicaments and biological substances status: Secondary | ICD-10-CM

## 2023-05-09 DIAGNOSIS — F419 Anxiety disorder, unspecified: Secondary | ICD-10-CM | POA: Diagnosis present

## 2023-05-09 DIAGNOSIS — M810 Age-related osteoporosis without current pathological fracture: Secondary | ICD-10-CM | POA: Diagnosis present

## 2023-05-09 DIAGNOSIS — Z7951 Long term (current) use of inhaled steroids: Secondary | ICD-10-CM

## 2023-05-09 DIAGNOSIS — G4733 Obstructive sleep apnea (adult) (pediatric): Secondary | ICD-10-CM | POA: Diagnosis present

## 2023-05-09 DIAGNOSIS — I25810 Atherosclerosis of coronary artery bypass graft(s) without angina pectoris: Secondary | ICD-10-CM | POA: Diagnosis present

## 2023-05-09 DIAGNOSIS — K7460 Unspecified cirrhosis of liver: Secondary | ICD-10-CM | POA: Diagnosis present

## 2023-05-09 DIAGNOSIS — I509 Heart failure, unspecified: Secondary | ICD-10-CM | POA: Diagnosis not present

## 2023-05-09 DIAGNOSIS — E1151 Type 2 diabetes mellitus with diabetic peripheral angiopathy without gangrene: Secondary | ICD-10-CM | POA: Diagnosis present

## 2023-05-09 DIAGNOSIS — R059 Cough, unspecified: Secondary | ICD-10-CM | POA: Diagnosis not present

## 2023-05-09 DIAGNOSIS — Z96653 Presence of artificial knee joint, bilateral: Secondary | ICD-10-CM | POA: Diagnosis present

## 2023-05-09 DIAGNOSIS — Z880 Allergy status to penicillin: Secondary | ICD-10-CM

## 2023-05-09 DIAGNOSIS — R55 Syncope and collapse: Secondary | ICD-10-CM | POA: Diagnosis not present

## 2023-05-09 DIAGNOSIS — R7309 Other abnormal glucose: Secondary | ICD-10-CM | POA: Insufficient documentation

## 2023-05-09 DIAGNOSIS — I2510 Atherosclerotic heart disease of native coronary artery without angina pectoris: Secondary | ICD-10-CM | POA: Diagnosis present

## 2023-05-09 DIAGNOSIS — J439 Emphysema, unspecified: Secondary | ICD-10-CM | POA: Diagnosis not present

## 2023-05-09 DIAGNOSIS — Z961 Presence of intraocular lens: Secondary | ICD-10-CM | POA: Diagnosis present

## 2023-05-09 DIAGNOSIS — Z87891 Personal history of nicotine dependence: Secondary | ICD-10-CM

## 2023-05-09 DIAGNOSIS — R0989 Other specified symptoms and signs involving the circulatory and respiratory systems: Secondary | ICD-10-CM | POA: Diagnosis not present

## 2023-05-09 DIAGNOSIS — Z794 Long term (current) use of insulin: Secondary | ICD-10-CM | POA: Diagnosis not present

## 2023-05-09 DIAGNOSIS — J441 Chronic obstructive pulmonary disease with (acute) exacerbation: Secondary | ICD-10-CM | POA: Diagnosis not present

## 2023-05-09 DIAGNOSIS — R413 Other amnesia: Secondary | ICD-10-CM | POA: Diagnosis present

## 2023-05-09 DIAGNOSIS — Z96642 Presence of left artificial hip joint: Secondary | ICD-10-CM | POA: Diagnosis present

## 2023-05-09 DIAGNOSIS — Z1152 Encounter for screening for COVID-19: Secondary | ICD-10-CM | POA: Diagnosis not present

## 2023-05-09 DIAGNOSIS — Z9841 Cataract extraction status, right eye: Secondary | ICD-10-CM

## 2023-05-09 DIAGNOSIS — R062 Wheezing: Secondary | ICD-10-CM | POA: Diagnosis not present

## 2023-05-09 DIAGNOSIS — Z79890 Hormone replacement therapy (postmenopausal): Secondary | ICD-10-CM | POA: Diagnosis not present

## 2023-05-09 DIAGNOSIS — I2489 Other forms of acute ischemic heart disease: Secondary | ICD-10-CM | POA: Diagnosis not present

## 2023-05-09 DIAGNOSIS — N1832 Chronic kidney disease, stage 3b: Secondary | ICD-10-CM | POA: Diagnosis present

## 2023-05-09 DIAGNOSIS — J449 Chronic obstructive pulmonary disease, unspecified: Secondary | ICD-10-CM | POA: Diagnosis not present

## 2023-05-09 DIAGNOSIS — K219 Gastro-esophageal reflux disease without esophagitis: Secondary | ICD-10-CM | POA: Diagnosis present

## 2023-05-09 DIAGNOSIS — I872 Venous insufficiency (chronic) (peripheral): Secondary | ICD-10-CM | POA: Diagnosis present

## 2023-05-09 DIAGNOSIS — I480 Paroxysmal atrial fibrillation: Secondary | ICD-10-CM | POA: Diagnosis not present

## 2023-05-09 DIAGNOSIS — I5043 Acute on chronic combined systolic (congestive) and diastolic (congestive) heart failure: Secondary | ICD-10-CM | POA: Diagnosis present

## 2023-05-09 DIAGNOSIS — F32A Depression, unspecified: Secondary | ICD-10-CM | POA: Diagnosis present

## 2023-05-09 DIAGNOSIS — R079 Chest pain, unspecified: Secondary | ICD-10-CM | POA: Diagnosis not present

## 2023-05-09 DIAGNOSIS — Z8249 Family history of ischemic heart disease and other diseases of the circulatory system: Secondary | ICD-10-CM

## 2023-05-09 DIAGNOSIS — Z6833 Body mass index (BMI) 33.0-33.9, adult: Secondary | ICD-10-CM

## 2023-05-09 DIAGNOSIS — I130 Hypertensive heart and chronic kidney disease with heart failure and stage 1 through stage 4 chronic kidney disease, or unspecified chronic kidney disease: Secondary | ICD-10-CM | POA: Diagnosis not present

## 2023-05-09 DIAGNOSIS — Z86718 Personal history of other venous thrombosis and embolism: Secondary | ICD-10-CM

## 2023-05-09 DIAGNOSIS — G2581 Restless legs syndrome: Secondary | ICD-10-CM | POA: Diagnosis present

## 2023-05-09 DIAGNOSIS — Z955 Presence of coronary angioplasty implant and graft: Secondary | ICD-10-CM

## 2023-05-09 DIAGNOSIS — Z66 Do not resuscitate: Secondary | ICD-10-CM | POA: Diagnosis present

## 2023-05-09 DIAGNOSIS — J9621 Acute and chronic respiratory failure with hypoxia: Secondary | ICD-10-CM | POA: Diagnosis present

## 2023-05-09 DIAGNOSIS — R0602 Shortness of breath: Secondary | ICD-10-CM | POA: Diagnosis not present

## 2023-05-09 DIAGNOSIS — R9431 Abnormal electrocardiogram [ECG] [EKG]: Secondary | ICD-10-CM | POA: Diagnosis present

## 2023-05-09 DIAGNOSIS — Z7982 Long term (current) use of aspirin: Secondary | ICD-10-CM

## 2023-05-09 DIAGNOSIS — E039 Hypothyroidism, unspecified: Secondary | ICD-10-CM | POA: Diagnosis present

## 2023-05-09 DIAGNOSIS — E785 Hyperlipidemia, unspecified: Secondary | ICD-10-CM | POA: Diagnosis present

## 2023-05-09 DIAGNOSIS — R0902 Hypoxemia: Secondary | ICD-10-CM | POA: Diagnosis not present

## 2023-05-09 DIAGNOSIS — E43 Unspecified severe protein-calorie malnutrition: Secondary | ICD-10-CM | POA: Diagnosis not present

## 2023-05-09 DIAGNOSIS — M109 Gout, unspecified: Secondary | ICD-10-CM | POA: Diagnosis present

## 2023-05-09 DIAGNOSIS — Z9981 Dependence on supplemental oxygen: Secondary | ICD-10-CM

## 2023-05-09 DIAGNOSIS — R0789 Other chest pain: Secondary | ICD-10-CM | POA: Diagnosis not present

## 2023-05-09 DIAGNOSIS — Z9842 Cataract extraction status, left eye: Secondary | ICD-10-CM

## 2023-05-09 DIAGNOSIS — L309 Dermatitis, unspecified: Secondary | ICD-10-CM | POA: Diagnosis present

## 2023-05-09 DIAGNOSIS — Z800 Family history of malignant neoplasm of digestive organs: Secondary | ICD-10-CM

## 2023-05-09 DIAGNOSIS — Z79899 Other long term (current) drug therapy: Secondary | ICD-10-CM

## 2023-05-09 DIAGNOSIS — I4510 Unspecified right bundle-branch block: Secondary | ICD-10-CM | POA: Diagnosis present

## 2023-05-09 DIAGNOSIS — I5032 Chronic diastolic (congestive) heart failure: Secondary | ICD-10-CM

## 2023-05-09 DIAGNOSIS — J45901 Unspecified asthma with (acute) exacerbation: Secondary | ICD-10-CM | POA: Diagnosis not present

## 2023-05-09 DIAGNOSIS — Z96611 Presence of right artificial shoulder joint: Secondary | ICD-10-CM | POA: Diagnosis present

## 2023-05-09 DIAGNOSIS — Z825 Family history of asthma and other chronic lower respiratory diseases: Secondary | ICD-10-CM

## 2023-05-09 LAB — COMPREHENSIVE METABOLIC PANEL
ALT: 11 U/L (ref 0–44)
AST: 18 U/L (ref 15–41)
Albumin: 3 g/dL — ABNORMAL LOW (ref 3.5–5.0)
Alkaline Phosphatase: 69 U/L (ref 38–126)
Anion gap: 15 (ref 5–15)
BUN: 20 mg/dL (ref 8–23)
CO2: 25 mmol/L (ref 22–32)
Calcium: 8.7 mg/dL — ABNORMAL LOW (ref 8.9–10.3)
Chloride: 96 mmol/L — ABNORMAL LOW (ref 98–111)
Creatinine, Ser: 1.39 mg/dL — ABNORMAL HIGH (ref 0.44–1.00)
GFR, Estimated: 38 mL/min — ABNORMAL LOW (ref >60)
Glucose, Bld: 102 mg/dL — ABNORMAL HIGH (ref 70–99)
Potassium: 4.2 mmol/L (ref 3.5–5.1)
Sodium: 136 mmol/L (ref 135–145)
Total Bilirubin: 0.4 mg/dL (ref 0.3–1.2)
Total Protein: 6.9 g/dL (ref 6.5–8.1)

## 2023-05-09 LAB — CBC WITH DIFFERENTIAL/PLATELET
Abs Immature Granulocytes: 0.11 10*3/uL — ABNORMAL HIGH (ref 0.00–0.07)
Basophils Absolute: 0 10*3/uL (ref 0.0–0.1)
Basophils Relative: 0 %
Eosinophils Absolute: 0 10*3/uL (ref 0.0–0.5)
Eosinophils Relative: 0 %
HCT: 44.6 % (ref 36.0–46.0)
Hemoglobin: 13.7 g/dL (ref 12.0–15.0)
Immature Granulocytes: 1 %
Lymphocytes Relative: 11 %
Lymphs Abs: 1.1 10*3/uL (ref 0.7–4.0)
MCH: 28.5 pg (ref 26.0–34.0)
MCHC: 30.7 g/dL (ref 30.0–36.0)
MCV: 92.7 fL (ref 80.0–100.0)
Monocytes Absolute: 1.3 10*3/uL — ABNORMAL HIGH (ref 0.1–1.0)
Monocytes Relative: 13 %
Neutro Abs: 7.3 10*3/uL (ref 1.7–7.7)
Neutrophils Relative %: 75 %
Platelets: 197 10*3/uL (ref 150–400)
RBC: 4.81 MIL/uL (ref 3.87–5.11)
RDW: 14.5 % (ref 11.5–15.5)
WBC: 9.8 10*3/uL (ref 4.0–10.5)
nRBC: 0 % (ref 0.0–0.2)

## 2023-05-09 LAB — BRAIN NATRIURETIC PEPTIDE: B Natriuretic Peptide: 475.4 pg/mL — ABNORMAL HIGH (ref 0.0–100.0)

## 2023-05-09 LAB — SARS CORONAVIRUS 2 BY RT PCR: SARS Coronavirus 2 by RT PCR: NEGATIVE

## 2023-05-09 LAB — TROPONIN I (HIGH SENSITIVITY)
Troponin I (High Sensitivity): 23 ng/L — ABNORMAL HIGH (ref <18)
Troponin I (High Sensitivity): 27 ng/L — ABNORMAL HIGH (ref <18)

## 2023-05-09 MED ORDER — DONEPEZIL HCL 5 MG PO TABS
5.0000 mg | ORAL_TABLET | Freq: Every day | ORAL | Status: DC
  Administered 2023-05-09 – 2023-05-14 (×6): 5 mg via ORAL
  Filled 2023-05-09 (×6): qty 1

## 2023-05-09 MED ORDER — HEPARIN SODIUM (PORCINE) 5000 UNIT/ML IJ SOLN
5000.0000 [IU] | Freq: Two times a day (BID) | INTRAMUSCULAR | Status: DC
  Administered 2023-05-09 – 2023-05-15 (×12): 5000 [IU] via SUBCUTANEOUS
  Filled 2023-05-09 (×12): qty 1

## 2023-05-09 MED ORDER — ARIPIPRAZOLE 2 MG PO TABS
2.0000 mg | ORAL_TABLET | Freq: Every day | ORAL | Status: DC
  Administered 2023-05-10 – 2023-05-15 (×6): 2 mg via ORAL
  Filled 2023-05-09 (×6): qty 1

## 2023-05-09 MED ORDER — LEVOTHYROXINE SODIUM 50 MCG PO TABS
50.0000 ug | ORAL_TABLET | Freq: Every day | ORAL | Status: DC
  Administered 2023-05-10 – 2023-05-15 (×6): 50 ug via ORAL
  Filled 2023-05-09 (×6): qty 1

## 2023-05-09 MED ORDER — ACETAMINOPHEN 500 MG PO TABS
1000.0000 mg | ORAL_TABLET | Freq: Three times a day (TID) | ORAL | Status: DC | PRN
  Administered 2023-05-11 – 2023-05-14 (×5): 1000 mg via ORAL
  Filled 2023-05-09 (×6): qty 2

## 2023-05-09 MED ORDER — IPRATROPIUM-ALBUTEROL 0.5-2.5 (3) MG/3ML IN SOLN
3.0000 mL | Freq: Once | RESPIRATORY_TRACT | Status: AC
  Administered 2023-05-09: 3 mL via RESPIRATORY_TRACT
  Filled 2023-05-09: qty 3

## 2023-05-09 MED ORDER — PILOCARPINE HCL 1 % OP SOLN
1.0000 [drp] | Freq: Two times a day (BID) | OPHTHALMIC | Status: DC
  Administered 2023-05-09 – 2023-05-15 (×12): 1 [drp] via OPHTHALMIC
  Filled 2023-05-09: qty 15

## 2023-05-09 MED ORDER — FUROSEMIDE 10 MG/ML IJ SOLN
60.0000 mg | Freq: Once | INTRAMUSCULAR | Status: AC
  Administered 2023-05-09: 60 mg via INTRAVENOUS
  Filled 2023-05-09: qty 6

## 2023-05-09 MED ORDER — AMLODIPINE BESYLATE 2.5 MG PO TABS
2.5000 mg | ORAL_TABLET | Freq: Every day | ORAL | Status: DC
  Administered 2023-05-10 – 2023-05-15 (×6): 2.5 mg via ORAL
  Filled 2023-05-09 (×6): qty 1

## 2023-05-09 MED ORDER — METHYLPREDNISOLONE SODIUM SUCC 40 MG IJ SOLR
40.0000 mg | Freq: Two times a day (BID) | INTRAMUSCULAR | Status: AC
  Administered 2023-05-09 – 2023-05-10 (×2): 40 mg via INTRAVENOUS
  Filled 2023-05-09 (×2): qty 1

## 2023-05-09 MED ORDER — SPIRONOLACTONE 25 MG PO TABS
25.0000 mg | ORAL_TABLET | Freq: Every day | ORAL | Status: DC
  Administered 2023-05-10 – 2023-05-13 (×4): 25 mg via ORAL
  Filled 2023-05-09 (×4): qty 1

## 2023-05-09 MED ORDER — DOCUSATE SODIUM 100 MG PO CAPS: 100.0000 mg | ORAL_CAPSULE | Freq: Every day | ORAL | Status: DC | PRN

## 2023-05-09 MED ORDER — DAPAGLIFLOZIN PROPANEDIOL 10 MG PO TABS
10.0000 mg | ORAL_TABLET | Freq: Every day | ORAL | Status: DC
  Filled 2023-05-09: qty 1

## 2023-05-09 MED ORDER — METOPROLOL TARTRATE 12.5 MG HALF TABLET
12.5000 mg | ORAL_TABLET | Freq: Two times a day (BID) | ORAL | Status: DC
  Administered 2023-05-09 – 2023-05-15 (×12): 12.5 mg via ORAL
  Filled 2023-05-09 (×12): qty 1

## 2023-05-09 MED ORDER — TRAZODONE HCL 50 MG PO TABS
150.0000 mg | ORAL_TABLET | Freq: Every day | ORAL | Status: DC
  Administered 2023-05-09 – 2023-05-14 (×6): 150 mg via ORAL
  Filled 2023-05-09 (×6): qty 3

## 2023-05-09 MED ORDER — PRAMIPEXOLE DIHYDROCHLORIDE 0.25 MG PO TABS
0.2500 mg | ORAL_TABLET | Freq: Every day | ORAL | Status: DC
  Administered 2023-05-09 – 2023-05-14 (×6): 0.25 mg via ORAL
  Filled 2023-05-09 (×6): qty 1

## 2023-05-09 MED ORDER — ATORVASTATIN CALCIUM 10 MG PO TABS
20.0000 mg | ORAL_TABLET | Freq: Every day | ORAL | Status: DC
  Administered 2023-05-09 – 2023-05-15 (×7): 20 mg via ORAL
  Filled 2023-05-09 (×7): qty 2

## 2023-05-09 MED ORDER — CITALOPRAM HYDROBROMIDE 20 MG PO TABS
10.0000 mg | ORAL_TABLET | Freq: Every day | ORAL | Status: DC
  Administered 2023-05-10 – 2023-05-15 (×6): 10 mg via ORAL
  Filled 2023-05-09 (×6): qty 1

## 2023-05-09 MED ORDER — ALBUTEROL SULFATE (2.5 MG/3ML) 0.083% IN NEBU
2.5000 mg | INHALATION_SOLUTION | Freq: Once | RESPIRATORY_TRACT | Status: AC
  Administered 2023-05-09: 2.5 mg via RESPIRATORY_TRACT
  Filled 2023-05-09: qty 3

## 2023-05-09 MED ORDER — DOXYCYCLINE HYCLATE 100 MG PO TABS
100.0000 mg | ORAL_TABLET | Freq: Two times a day (BID) | ORAL | Status: AC
  Administered 2023-05-09 – 2023-05-14 (×10): 100 mg via ORAL
  Filled 2023-05-09 (×10): qty 1

## 2023-05-09 MED ORDER — IPRATROPIUM-ALBUTEROL 20-100 MCG/ACT IN AERS: 1.0000 | INHALATION_SPRAY | Freq: Three times a day (TID) | RESPIRATORY_TRACT | Status: DC | PRN

## 2023-05-09 MED ORDER — ALLOPURINOL 100 MG PO TABS
100.0000 mg | ORAL_TABLET | Freq: Every day | ORAL | Status: DC
  Administered 2023-05-10 – 2023-05-15 (×6): 100 mg via ORAL
  Filled 2023-05-09 (×6): qty 1

## 2023-05-09 MED ORDER — POLYETHYLENE GLYCOL 3350 17 G PO PACK: 17.0000 g | PACK | ORAL | Status: DC | PRN

## 2023-05-09 MED ORDER — MOMETASONE FURO-FORMOTEROL FUM 200-5 MCG/ACT IN AERO
2.0000 | INHALATION_SPRAY | Freq: Two times a day (BID) | RESPIRATORY_TRACT | Status: DC
  Administered 2023-05-09 – 2023-05-15 (×12): 2 via RESPIRATORY_TRACT
  Filled 2023-05-09 (×2): qty 8.8

## 2023-05-09 MED ORDER — PREDNISONE 20 MG PO TABS: 40.0000 mg | ORAL_TABLET | Freq: Every day | ORAL | Status: DC

## 2023-05-09 MED ORDER — MELATONIN 5 MG PO TABS
10.0000 mg | ORAL_TABLET | Freq: Every day | ORAL | Status: DC
  Administered 2023-05-09 – 2023-05-14 (×6): 10 mg via ORAL
  Filled 2023-05-09 (×6): qty 2

## 2023-05-09 MED ORDER — PANTOPRAZOLE SODIUM 40 MG PO TBEC
40.0000 mg | DELAYED_RELEASE_TABLET | Freq: Every day | ORAL | Status: DC
  Administered 2023-05-10 – 2023-05-15 (×6): 40 mg via ORAL
  Filled 2023-05-09 (×6): qty 1

## 2023-05-09 MED ORDER — OXYCODONE HCL 5 MG PO TABS
5.0000 mg | ORAL_TABLET | Freq: Four times a day (QID) | ORAL | Status: DC | PRN
  Administered 2023-05-10 – 2023-05-15 (×8): 5 mg via ORAL
  Filled 2023-05-09 (×8): qty 1

## 2023-05-09 MED ORDER — ROFLUMILAST 500 MCG PO TABS
500.0000 ug | ORAL_TABLET | Freq: Every day | ORAL | Status: DC
  Administered 2023-05-10 – 2023-05-15 (×6): 500 ug via ORAL
  Filled 2023-05-09 (×6): qty 1

## 2023-05-09 MED ORDER — ALBUTEROL SULFATE (2.5 MG/3ML) 0.083% IN NEBU: 2.5000 mg | INHALATION_SOLUTION | Freq: Four times a day (QID) | RESPIRATORY_TRACT | Status: DC | PRN

## 2023-05-09 MED ORDER — IPRATROPIUM-ALBUTEROL 0.5-2.5 (3) MG/3ML IN SOLN: 3.0000 mL | Freq: Three times a day (TID) | RESPIRATORY_TRACT | Status: DC | PRN

## 2023-05-09 MED ORDER — ASPIRIN 81 MG PO TBEC
81.0000 mg | DELAYED_RELEASE_TABLET | Freq: Every day | ORAL | Status: DC
  Administered 2023-05-10 – 2023-05-15 (×6): 81 mg via ORAL
  Filled 2023-05-09 (×6): qty 1

## 2023-05-09 MED ORDER — FUROSEMIDE 40 MG PO TABS
80.0000 mg | ORAL_TABLET | ORAL | Status: DC
  Administered 2023-05-10 – 2023-05-12 (×2): 80 mg via ORAL
  Filled 2023-05-09 (×3): qty 2

## 2023-05-09 MED ORDER — GABAPENTIN 100 MG PO CAPS
100.0000 mg | ORAL_CAPSULE | Freq: Two times a day (BID) | ORAL | Status: DC
  Administered 2023-05-09 – 2023-05-15 (×12): 100 mg via ORAL
  Filled 2023-05-09 (×13): qty 1

## 2023-05-09 MED ORDER — POTASSIUM CHLORIDE CRYS ER 20 MEQ PO TBCR
20.0000 meq | EXTENDED_RELEASE_TABLET | Freq: Every day | ORAL | Status: DC
  Administered 2023-05-10: 20 meq via ORAL
  Filled 2023-05-09: qty 1

## 2023-05-09 MED ORDER — CYCLOBENZAPRINE HCL 10 MG PO TABS
10.0000 mg | ORAL_TABLET | Freq: Three times a day (TID) | ORAL | Status: DC | PRN
  Administered 2023-05-11 – 2023-05-15 (×4): 10 mg via ORAL
  Filled 2023-05-09 (×5): qty 1

## 2023-05-09 NOTE — H&P (Signed)
History and Physical    [name] YNW:[id number] DOB: 10-Apr-1941 DOA: 05/09/2023  PCP: Crist Fat, MD (Confirm with patient/family/NH records and if not entered, this has to be entered at [facility] point of entry) Patient coming from: Home  I have personally briefly reviewed patient's old medical records in [facility]  Chief Complaint: Cough, wheezing, SOB  HPI: [name] is a 82 y.o. female with medical history significant of COPD Gold stage III with chronic restrictive lung disease, chronic hypoxic respite failure on 4 L as needed for activity, PAF, CAD status post CABG, chronic HFrEF, HTN, CKD stage IIIa, IIDM, presented with worsening of cough wheezing shortness of breath.  Symptoms started 3 days ago, with new onset of cough initially was dry then turned productive since yesterday with light yellowish sputum, along with wheezing and increasing exertional dyspnea.  Started to use home oxygen 4 L continuously with no significant improvement.  Started to have continuous shortness of breath overnight unable to sleep.  Denies any chest pain, no fever or chills.  Denies any peripheral edema.  No recent medication changes.  Today, patient went to urgent care was found to be in COPD exacerbation was given 1 dose of steroid and sent to ED.  Patient was found to be hypoxic in the ED with saturation 87% 2 L, and patient was given multiple rounds of DuoNebs but continued to experience shortness of breath and wheezy.  Chest x-ray showed no acute findings.  Review of Systems: As per HPI otherwise 14 point review of systems negative.    Past Medical History:  Diagnosis Date   Anemia    bld. transfusion post lumbar surgery- 2012   Anxiety    Arthralgia    NOS   CAD (coronary artery disease)    s/p CABG 2004; s/p DES to LM in 2010;  LHC 10/29/11: EF 50-55%, mild elevated filling pressures, no pulmonary HTN, LM 90% ISR, LAD and CFX occluded, S-RI occluded (old), S-OM3 ok and  L-LAD ok, native nondominant RCA 95% -  med rx recommended ; Lexiscan Myoview 7/13 at [facility]: demonstrated "normal LV function, anterior attenuation and localized ischemia, inferior, basilar, mid section"   Carotid artery disease (HCC)    Carotid US 6/19:  RICA 1-39; LICA 40-59; R subclavian stenosis - Repeat 1 year. // Carotid US 05/2019: R 1-39; L 40-59; R vertebral with atypical antegrade flow; R subclavian stenosis >> repeat 1 year // Carotid US 7/21: Bilat 40-59; L subclavian stenosis // Carotid US 7/22: Bilat ICA 40-59; R subclavian stenosis   CHF (congestive heart failure) (HCC) 01/21/2020   Chronic diastolic heart failure (HCC)    Echo 9/10: EF 60-55%, grade 1 diastolic dysfunction   Chronic lower back pain    Chronic respiratory failure (HCC)    Cirrhosis (HCC)    CKD (chronic kidney disease), stage III (HCC)    COPD (chronic obstructive pulmonary disease) (HCC)    Emphysema dxed by Dr. Sherril Croon in [place] based on PFTs per pt in 2006; placed on albuterol   Degenerative disc disease, lumbar    Depression    TAKES CELEXA  AND  (OFF- WELBUTRIN)   Diuretic-induced hypokalemia 10/08/2018   DVT of lower extremity (deep venous thrombosis) (HCC)    recurrent. bilateral (2 episodes)   Dyspnea    home o2 when needed   Dysrhythmia    afib with cabg   Exertional angina    Treated with Isosorbide, Ranexa, amlodipine; intolerant to metoprolol  GERD (gastroesophageal reflux disease)    Gout    "on daily RX" (10/06/2018)   HLD (hyperlipidemia)    Hyperlipidemia    Hypertension    Hypothyroidism    L Subclavian Artery Stenosis    Carotid US 7/21    Obesity (BMI 30-39.9) 2009   BMI 33   On home oxygen therapy    OSA (obstructive sleep apnea)    Osteoarthritis    "all over; hands" (10/06/2018)   Osteoporosis    Oxygen dependent    4L at home as needed (10/06/2018)   PVD (peripheral vascular disease) (HCC)    Restless leg syndrome    Vitamin D deficiency     Past Surgical  History:  Procedure Laterality Date   ANKLE SURGERY  2004   ANTERIOR CERVICAL DECOMP/DISCECTOMY FUSION  11/2007   AUGMENTATION MAMMAPLASTY     BACK SURGERY  1986   lower; another scheduled, opt. reports 4 back- lumbar, 3 cerv. fusions  for later 2009   CARDIAC CATHETERIZATION  11/2009   Patent LIMA to LAD and patent SVG to OM1. Occluded SVG to ramus and diagonal. Left main: 90% ostial stenosis, LCX 60-70% proximal stenosis   CATARACT EXTRACTION W/ INTRAOCULAR LENS  IMPLANT, BILATERAL Bilateral 2006   COLONOSCOPY WITH PROPOFOL N/A 02/17/2017   Procedure: COLONOSCOPY WITH PROPOFOL;  Surgeon: Beverley Fiedler, MD;  Location: [facility] ENDOSCOPY;  Service: Endoscopy;  Laterality: N/A;   CORONARY ANGIOPLASTY WITH STENT PLACEMENT  11/2009   Drug eluting stent to left main artery: 4.0 X 12 mm Ion    CORONARY ARTERY BYPASS GRAFT  2004   "CABG X4"   ESOPHAGOGASTRODUODENOSCOPY N/A 02/14/2017   Procedure: ESOPHAGOGASTRODUODENOSCOPY (EGD);  Surgeon: Sherrilyn Rist, MD;  Location: [facility] ENDOSCOPY;  Service: Endoscopy;  Laterality: N/A;   GIVENS CAPSULE STUDY N/A 02/15/2017   Procedure: GIVENS CAPSULE STUDY;  Surgeon: Sherrilyn Rist, MD;  Location: [facility] ENDOSCOPY;  Service: Endoscopy;  Laterality: N/A;   GROIN MASS OPEN BIOPSY  2004   IR IVC FILTER PLMT / S&I /IMG GUID/MOD SED  03/14/2018   IR PARACENTESIS  03/10/2018   JOINT REPLACEMENT     LAPAROSCOPIC CHOLECYSTECTOMY     PERIPHERAL VASCULAR INTERVENTION Left 03/05/2018   Procedure: PERIPHERAL VASCULAR INTERVENTION;  Surgeon: Sherren Kerns, MD;  Location: [facility] INVASIVE CV LAB;  Service: Cardiovascular;  Laterality: Left;  Attempted unsuccess\ful Per Dr. Jettie Booze   PERIPHERAL VASCULAR INTERVENTION  03/08/2018   Procedure: PERIPHERAL VASCULAR INTERVENTION;  Surgeon: Maeola Harman, MD;  Location: [facility] INVASIVE CV LAB;  Service: Cardiovascular;;  SMA Stent    REPLACEMENT TOTAL KNEE BILATERAL Bilateral 2012   REVERSE SHOULDER ARTHROPLASTY  02/26/2012    Procedure: REVERSE SHOULDER ARTHROPLASTY;  Surgeon: Senaida Lange, MD;  Location: [facility] OR;  Service: Orthopedics;  Laterality: Right;  RIGHT SHOULDER REVERSED ARTHROPLASTY   SHOULDER ARTHROSCOPY WITH SUBACROMIAL DECOMPRESSION Left 02/10/2013   Procedure: SHOULDER ARTHROSCOPY WITH SUBACROMIAL DECOMPRESSION DISTAL CLAVICLE RESECTION;  Surgeon: Senaida Lange, MD;  Location: [facility] OR;  Service: Orthopedics;  Laterality: Left;  DISTAL CLAVICLE RESECTION   TONSILLECTOMY     TOTAL HIP ARTHROPLASTY  2007   Right   TOTAL HIP ARTHROPLASTY Left 02/07/2020   Procedure: TOTAL HIP ARTHROPLASTY ANTERIOR APPROACH;  Surgeon: Durene Romans, MD;  Location: [facility] ORS;  Service: Orthopedics;  Laterality: Left;  70 mins   TRANSURETHRAL RESECTION OF BLADDER TUMOR N/A 05/31/2018   Procedure: TRANSURETHRAL RESECTION OF BLADDER TUMOR (TURBT);  Surgeon: Crista Elliot, MD;  Location: [facility] ORS;  Service: Urology;  Laterality: N/A;   VISCERAL ANGIOGRAPHY N/A 03/05/2018   Procedure: VISCERAL ANGIOGRAPHY;  Surgeon: Sherren Kerns, MD;  Location: [facility] INVASIVE CV LAB;  Service: Cardiovascular;  Laterality: N/A;   VISCERAL ANGIOGRAPHY N/A 03/08/2018   Procedure: VISCERAL ANGIOGRAPHY;  Surgeon: Maeola Harman, MD;  Location: [facility] INVASIVE CV LAB;  Service: Cardiovascular;  Laterality: N/A;     reports that she quit smoking about 24 years ago. Her smoking use included cigarettes. She has a 45.00 pack-year smoking history. She has never used smokeless tobacco. She reports that she does not drink alcohol and does not use drugs.  Allergies  Allergen Reactions   Ativan [Lorazepam] Other (See Comments)    Confusion    Pitavastatin Itching and Other (See Comments)    Reaction to Livalo   Ropinirole Nausea Only and Other (See Comments)    Makes legs jump, also   Zofran [Ondansetron Hcl] Nausea Only   Zolpidem Tartrate Anxiety and Other (See Comments)    CONFUSION    Zolpidem Tartrate Swelling    CONFUSION   Penicillins Rash  and Other (See Comments)    Tolerated cephalosporins in past Has patient had a PCN reaction causing immediate rash, facial/tongue/throat swelling, SOB or lightheadedness with hypotension: Yes Has patient had a PCN reaction causing severe rash involving mucus membranes or skin necrosis: No Has patient had a PCN reaction that required hospitalization: No Has patient had a PCN reaction occurring within the last 10 years: No If all of the above answers are "NO", then may proceed with Cephalosporin use.     Family History  Problem Relation Age of Onset   Ovarian cancer Mother    Lung cancer Father    COPD Father    Heart disease Father    Heart disease Sister    Stomach cancer Sister    Colitis Sister      Prior to Admission medications   Medication Sig Start Date End Date Taking? Authorizing Provider  albuterol (PROVENTIL) (2.5 MG/3ML) 0.083% nebulizer solution Take 2.5 mg by nebulization every 6 (six) hours as needed for wheezing or shortness of breath.   Yes [provider]  allopurinol (ZYLOPRIM) 100 MG tablet TAKE 1 TABLET BY MOUTH DAILY WITH BREAKFAST. 05/08/23  Yes Crist Fat, MD  amLODipine (NORVASC) 2.5 MG tablet Take 1/2 tablet as needed for BP greater than 160. 02/24/22  Yes Pricilla Riffle, MD  ARIPiprazole (ABILIFY) 2 MG tablet TAKE 1 TABLET BY MOUTH ONCE DAILY 03/05/23  Yes Crist Fat, MD  aspirin EC 81 MG tablet Take 81 mg by mouth daily with breakfast. Swallow whole.   Yes [provider]  Aspirin-Acetaminophen-Caffeine (GOODY HEADACHE PO) Take 1 Package by mouth daily.   Yes [provider]  atorvastatin (LIPITOR) 20 MG tablet TAKE 1 TABLET ONCE DAILY AT BEDTIME Patient taking differently: Take 20 mg by mouth daily. 05/08/23  Yes Crist Fat, MD  cholecalciferol (VITAMIN D3) 25 MCG (1000 UNIT) tablet Take 1 tablet (1,000 Units total) by mouth daily with supper. 11/11/22  Yes Crist Fat, MD  citalopram (CELEXA) 10 MG tablet TAKE 1 TABLET  BY MOUTH ONCE DAILY 05/08/23  Yes Crist Fat, MD  cyclobenzaprine (FLEXERIL) 10 MG tablet Take 10 mg by mouth 3 (three) times daily as needed for muscle spasms.   Yes [provider]  D3-1000 25 MCG (1000 UT) capsule TAKE 1 CAPSULE BY MOUTH DAILY WITH SUPPER. 05/08/23  Yes  Leonia Reader, Barbara Cower, MD  dapagliflozin propanediol (FARXIGA) 10 MG TABS tablet Take 1 tablet (10 mg total) by mouth daily before breakfast. 07/07/22  Yes Pricilla Riffle, MD  Docusate Sodium (DSS) 100 MG CAPS Take 100 mg by mouth daily as needed (constipation).   Yes [provider]  donepezil (ARICEPT) 5 MG tablet Take 1 tablet (5 mg total) by mouth at bedtime. 04/30/23  Yes Crist Fat, MD  ferrous sulfate 325 (65 FE) MG tablet TAKE 1 TABLET 2 TIMES PER DAY Patient taking differently: Take 325 mg by mouth 2 (two) times daily. 05/08/23  Yes Crist Fat, MD  fluticasone-salmeterol (ADVAIR DISKUS) 250-50 MCG/ACT AEPB Inhale 1 puff into the lungs in the morning and at bedtime. 01/08/23  Yes Crist Fat, MD  furosemide (LASIX) 80 MG tablet Take 80 mg by mouth every other day.   Yes [provider]  gabapentin (NEURONTIN) 100 MG capsule TAKE 2 CAPSULES BY MOUTH TWICE DAILY Patient taking differently: Take 100 mg by mouth 2 (two) times daily. Take 1 capsule by mouth in the Morning, and 2 at Night 04/20/23  Yes Leonia Reader, Barbara Cower, MD  Ipratropium-Albuterol (COMBIVENT RESPIMAT) 20-100 MCG/ACT AERS respimat Inhale 1 puff into the lungs 3 (three) times daily as needed for wheezing or shortness of breath.   Yes [provider]  levothyroxine (SYNTHROID) 50 MCG tablet TAKE 1 TABLET BY MOUTH DAILY BEFORE BREAKFAST. 11/07/22  Yes Pricilla Riffle, MD  magnesium gluconate (MAGONATE) 500 MG tablet Take 500 mg by mouth daily.   Yes [provider]  Magnesium Oxide -Mg Supplement 500 MG CAPS TAKE 1 CAPSULE BY MOUTH DAILY 03/19/23  Yes Crist Fat, MD  Melatonin 10 MG TABS Take 30 mg by mouth at bedtime.   Yes  [provider]  metolazone (ZAROXOLYN) 2.5 MG tablet Take 1 tablet (2.5 mg total) by mouth once a week. Take 30 minutes before Lasix (furosemide). 02/27/22  Yes Pricilla Riffle, MD  metoprolol tartrate (LOPRESSOR) 25 MG tablet TAKE 1/2 TABLET BY MOUTH TWICE DAILY 03/27/23  Yes Crist Fat, MD  nitroGLYCERIN (NITROSTAT) 0.4 MG SL tablet Place 1 tablet (0.4 mg total) under the tongue every 5 (five) minutes as needed for chest pain. For chest pain 04/24/16  Yes Pricilla Riffle, MD  OXYGEN Inhale 4 L/min into the lungs as needed (DURING ALL TIMES OF EXERTION).    Yes [provider]  pantoprazole (PROTONIX) 40 MG tablet TAKE 1 TABLET ONCE DAILY 04/20/23  Yes Crist Fat, MD  Phenyleph-Doxylamine-DM-APAP (NYQUIL SEVERE COLD/FLU) 5-6.25-10-325 MG/15ML LIQD Take 30 mLs by mouth at bedtime.   Yes [provider]  Phenylephrine-DM-GG-APAP (VICKS DAYQUIL SEVERE COLD/FLU) 5-10-200-325 MG/15ML LIQD Take 30 mLs by mouth in the morning.   Yes [provider]  pilocarpine (PILOCAR) 1 % ophthalmic solution Place 1 drop into both eyes 2 (two) times daily.   Yes [provider]  potassium chloride SA (KLOR-CON M) 20 MEQ tablet TAKE 1 TABLET ONCE DAILY WITH FOOD Patient taking differently: Take 20 mEq by mouth daily. Break in half. 02/18/23  Yes Crist Fat, MD  pramipexole (MIRAPEX) 0.25 MG tablet TAKE 1 TABLET 2-3 HOURS BEFORE BEDTIME Patient taking differently: Take 0.25 mg by mouth at bedtime. Take 1 tablet by mouth 2-3 hours before bedtime 04/09/23  Yes Leonia Reader, Barbara Cower, MD  roflumilast (DALIRESP) 500 MCG TABS tablet TAKE 1 TABLET ONCE DAILY 04/09/23  Yes Crist Fat, MD  spironolactone (ALDACTONE)  25 MG tablet TAKE 1 TABLET BY MOUTH DAILY. 12/17/22  Yes Pricilla Riffle, MD  traZODone (DESYREL) 50 MG tablet TAKE 3 TABLETS EVERY NIGHT AT BEDTIME. Patient taking differently: Take 150 tablets by mouth at bedtime. 04/09/23  Yes Crist Fat, MD  vitamin C (ASCORBIC ACID) 500  MG tablet Take 500 mg by mouth daily with lunch.   Yes [provider]  XTAMPZA ER 9 MG C12A Take 9 mg by mouth in the morning, at noon, and at bedtime. 04/30/23  Yes Crist Fat, MD  acetaminophen (TYLENOL) 500 MG tablet Take 1,000 mg by mouth daily. 02/08/20   [provider]  donepezil (ARICEPT) 10 MG tablet Take 1 tablet (10 mg total) by mouth at bedtime. 05/30/23   Crist Fat, MD  polyethylene glycol (MIRALAX / GLYCOLAX) 17 g packet Take 17 g by mouth as needed. 02/08/20   [provider]    Physical Exam: Vitals:   05/09/23 1530 05/09/23 1539 05/09/23 1630 05/09/23 1645  BP:      Pulse: 81 80 85 81  Resp: (!) 24 16 15 18   Temp:      TempSrc:      SpO2: 98% 98% 91% 92%    Constitutional: NAD, calm, comfortable Vitals:   05/09/23 1530 05/09/23 1539 05/09/23 1630 05/09/23 1645  BP:      Pulse: 81 80 85 81  Resp: (!) 24 16 15 18   Temp:      TempSrc:      SpO2: 98% 98% 91% 92%   Eyes: PERRL, lids and conjunctivae normal ENMT: Mucous membranes are moist. Posterior pharynx clear of any exudate or lesions.Normal dentition.  Neck: normal, supple, no masses, no thyromegaly Respiratory: Diminished breathing sound bilaterally, diffused wheezing, scattered crackles on bilateral bases, increasing respiratory effort. No accessory muscle use.  Cardiovascular: Regular rate and rhythm, no murmurs / rubs / gallops. No extremity edema. 2+ pedal pulses. No carotid bruits.  Abdomen: no tenderness, no masses palpated. No hepatosplenomegaly. Bowel sounds positive.  Musculoskeletal: no clubbing / cyanosis. No joint deformity upper and lower extremities. Good ROM, no contractures. Normal muscle tone.  Skin: no rashes, lesions, ulcers. No induration Neurologic: CN 2-12 grossly intact. Sensation intact, DTR normal. Strength 5/5 in all 4.  Psychiatric: Normal judgment and insight. Alert and oriented x 3. Normal mood.     Labs on Admission: I have personally reviewed  following labs and imaging studies  CBC: Recent Labs  Lab 05/09/23 1328  WBC 9.8  NEUTROABS 7.3  HGB 13.7  HCT 44.6  MCV 92.7  PLT 197   Basic Metabolic Panel: Recent Labs  Lab 05/09/23 1328  NA 136  K 4.2  CL 96*  CO2 25  GLUCOSE 102*  BUN 20  CREATININE 1.39*  CALCIUM 8.7*   GFR: Estimated Creatinine Clearance: 30.8 mL/min (A) (by C-G formula based on SCr of 1.39 mg/dL (H)). Liver Function Tests: Recent Labs  Lab 05/09/23 1328  AST 18  ALT 11  ALKPHOS 69  BILITOT 0.4  PROT 6.9  ALBUMIN 3.0*   No results for input(s): "LIPASE", "AMYLASE" in the last 168 hours. No results for input(s): "AMMONIA" in the last 168 hours. Coagulation Profile: No results for input(s): "INR", "PROTIME" in the last 168 hours. Cardiac Enzymes: No results for input(s): "CKTOTAL", "CKMB", "CKMBINDEX", "TROPONINI" in the last 168 hours. BNP (last 3 results) Recent Labs    03/02/23 1146  PROBNP 1,203*   HbA1C: No results for input(s): "HGBA1C"  in the last 72 hours. CBG: No results for input(s): "GLUCAP" in the last 168 hours. Lipid Profile: No results for input(s): "CHOL", "HDL", "LDLCALC", "TRIG", "CHOLHDL", "LDLDIRECT" in the last 72 hours. Thyroid Function Tests: No results for input(s): "TSH", "T4TOTAL", "FREET4", "T3FREE", "THYROIDAB" in the last 72 hours. Anemia Panel: No results for input(s): "VITAMINB12", "FOLATE", "FERRITIN", "TIBC", "IRON", "RETICCTPCT" in the last 72 hours. Urine analysis:    Component Value Date/Time   COLORURINE YELLOW 10/10/2020 1125   APPEARANCEUR CLEAR 10/10/2020 1125   LABSPEC 1.014 10/10/2020 1125   PHURINE 5.0 10/10/2020 1125   GLUCOSEU NEGATIVE 10/10/2020 1125   HGBUR NEGATIVE 10/10/2020 1125   BILIRUBINUR negative 01/09/2023 1116   KETONESUR NEGATIVE 10/10/2020 1125   PROTEINUR Negative 01/09/2023 1116   PROTEINUR NEGATIVE 10/10/2020 1125   UROBILINOGEN 0.2 01/09/2023 1116   UROBILINOGEN 1.0 03/30/2011 0901   NITRITE Negative  01/09/2023 1116   NITRITE NEGATIVE 10/10/2020 1125   LEUKOCYTESUR Negative 01/09/2023 1116   LEUKOCYTESUR NEGATIVE 10/10/2020 1125    Radiological Exams on Admission: DG Chest Portable 1 View  Result Date: 05/09/2023 CLINICAL DATA:  Cough, COPD EXAM: PORTABLE CHEST 1 VIEW COMPARISON:  06/05/2022 FINDINGS: Single frontal view of the chest demonstrates a stable cardiac silhouette. Stable ectasia and atherosclerosis of the thoracic aorta. Background pleural and parenchymal scarring again noted consistent with history of emphysema. No acute airspace disease, effusion, or pneumothorax. The calcified bilateral breast prostheses. Stable right shoulder arthroplasty. IMPRESSION: 1. Neck on scarring and emphysema.  No acute airspace disease. Electronically Signed   By: Sharlet Salina M.D.   On: 05/09/2023 14:22    EKG: Independently reviewed.  New RBBB compared to old EKGs with nonspecific secondary ST changes on multiple leads.  LVH, prolonged QTc [phone]  Assessment/Plan Principal Problem:   COPD (chronic obstructive pulmonary disease) (HCC) Active Problems:   Acute on chronic respiratory failure with hypoxia (HCC)   Acute exacerbation of congestive heart failure (HCC)  (please populate well all problems here in Problem List. (For example, if patient is on BP meds at home and you resume or decide to hold them, it is a problem that needs to be her. Same for CAD, COPD, HLD and so on)  Acute on chronic hypoxic respiratory failure -Secondary to acute COPD exacerbation -Continue IV steroid every 12 hours x 2 then switch to prednisone p.o. tomorrow -Doxycycline -DuoNebs, ICS and LABA, other supporting medications including guaifenesin -Incentive spirometry and flutter valve  Acute on chronic HFpEF decompensation  -Mild fluid overload with bilateral lower field crackles, 1 dose of 60 mg IV Lasix ordered  -Repeat chest x-ray tomorrow, expect resume p.o. Lasix tomorrow  -Echocardiogram  ordered.  HTN -Controlled, continue amlodipine metoprolol, and Aldactone Lasix as above  PAF -In sinus rhythm, not on anticoagulation due to severe GI bleed Hx of  CKD stage IIIb -Received IV diuresis today, monitor kidney function  CAD -No chest pains, EKG showed new RBBB with some nonspecific ST changes on multiple leads.  Troponin borderline elevated with flat trending.  Echocardiogram ordered  IIDM -Stable, continue Farxiga  Prolonged Qtc -On multiple SSRI and DuoNebs, will recheck EKG tomorrow to make sure QTc intervals stable  New RBBB -Unknown duration, compared to multiple previous EKG confirmed new RBBB with nonspecific secondary ST changes on multiple leads.  Troponin borderline elevated but pattern is flat arguing against ACS.  Check echocardiogram  DVT prophylaxis: Heparin subcu Code Status: DNR Family Communication: Granddaughter at bedside Disposition Plan: Expect less than 2 midnight hospital  stay Consults called: None Admission status: Telemetry observation   Emeline General MD [facility] Pager [phone]  05/09/2023, 4:51 PM

## 2023-05-09 NOTE — ED Notes (Signed)
ED TO INPATIENT HANDOFF REPORT  ED Nurse Name and Phone #: [phone], Lendon Ka Name/Age/Gender [name] 82 y.o. female Room/Bed: 015C/015C  Code Status   Code Status: DNR  Home/SNF/Other Home Patient oriented to: self, place, time, and situation Is this baseline? Yes   Triage Complete: Triage complete  Chief Complaint COPD (chronic obstructive pulmonary disease) (HCC) [J44.9]  Triage Note Pt BIB [facility] from an urgent care d/t SOB that started 4 days ago. Pt wears 4L as needed at home. Urgent had pt on 2L, and she was sating around 87%. A&O X4. Covid was negative at urgent care. Hx COPD.    Allergies Allergies  Allergen Reactions   Ativan [Lorazepam] Other (See Comments)    Confusion    Pitavastatin Itching and Other (See Comments)    Reaction to Livalo   Ropinirole Nausea Only and Other (See Comments)    Makes legs jump, also   Zofran [Ondansetron Hcl] Nausea Only   Zolpidem Tartrate Anxiety and Other (See Comments)    CONFUSION    Zolpidem Tartrate Swelling    CONFUSION   Penicillins Rash and Other (See Comments)    Tolerated cephalosporins in past Has patient had a PCN reaction causing immediate rash, facial/tongue/throat swelling, SOB or lightheadedness with hypotension: Yes Has patient had a PCN reaction causing severe rash involving mucus membranes or skin necrosis: No Has patient had a PCN reaction that required hospitalization: No Has patient had a PCN reaction occurring within the last 10 years: No If all of the above answers are "NO", then may proceed with Cephalosporin use.     Level of Care/Admitting Diagnosis ED Disposition     ED Disposition  Admit   Condition  --   Comment  Hospital Area: [facility] [[id number]]  Level of Care: Telemetry Medical [[id number]]  May place patient in observation at [facility] or [facility] if equivalent level of care is available:: No  Covid Evaluation: Asymptomatic - no recent exposure (last  10 days) testing not required  Diagnosis: COPD (chronic obstructive pulmonary disease) [facility]) [[id number]]  Admitting Physician: Emeline General [[id number]]  Attending Physician: Emeline General [[id number]]          B Medical/Surgery History Past Medical History:  Diagnosis Date   Anemia    bld. transfusion post lumbar surgery- 2012   Anxiety    Arthralgia    NOS   CAD (coronary artery disease)    s/p CABG 2004; s/p DES to LM in 2010;  LHC 10/29/11: EF 50-55%, mild elevated filling pressures, no pulmonary HTN, LM 90% ISR, LAD and CFX occluded, S-RI occluded (old), S-OM3 ok and L-LAD ok, native nondominant RCA 95% -  med rx recommended ; Lexiscan Myoview 7/13 at [facility]: demonstrated "normal LV function, anterior attenuation and localized ischemia, inferior, basilar, mid section"   Carotid artery disease (HCC)    Carotid US 6/19:  RICA 1-39; LICA 40-59; R subclavian stenosis - Repeat 1 year. // Carotid US 05/2019: R 1-39; L 40-59; R vertebral with atypical antegrade flow; R subclavian stenosis >> repeat 1 year // Carotid US 7/21: Bilat 40-59; L subclavian stenosis // Carotid US 7/22: Bilat ICA 40-59; R subclavian stenosis   CHF (congestive heart failure) (HCC) 01/21/2020   Chronic diastolic heart failure (HCC)    Echo 9/10: EF 60-55%, grade 1 diastolic dysfunction   Chronic lower back pain    Chronic respiratory failure (HCC)    Cirrhosis (HCC)  CKD (chronic kidney disease), stage III (HCC)    COPD (chronic obstructive pulmonary disease) (HCC)    Emphysema dxed by Dr. Sherril Croon in [place] based on PFTs per pt in 2006; placed on albuterol   Degenerative disc disease, lumbar    Depression    TAKES CELEXA  AND  (OFF- WELBUTRIN)   Diuretic-induced hypokalemia 10/08/2018   DVT of lower extremity (deep venous thrombosis) (HCC)    recurrent. bilateral (2 episodes)   Dyspnea    home o2 when needed   Dysrhythmia    afib with cabg   Exertional angina    Treated with Isosorbide, Ranexa,  amlodipine; intolerant to metoprolol   GERD (gastroesophageal reflux disease)    Gout    "on daily RX" (10/06/2018)   HLD (hyperlipidemia)    Hyperlipidemia    Hypertension    Hypothyroidism    L Subclavian Artery Stenosis    Carotid US 7/21    Obesity (BMI 30-39.9) 2009   BMI 33   On home oxygen therapy    OSA (obstructive sleep apnea)    Osteoarthritis    "all over; hands" (10/06/2018)   Osteoporosis    Oxygen dependent    4L at home as needed (10/06/2018)   PVD (peripheral vascular disease) (HCC)    Restless leg syndrome    Vitamin D deficiency    Past Surgical History:  Procedure Laterality Date   ANKLE SURGERY  2004   ANTERIOR CERVICAL DECOMP/DISCECTOMY FUSION  11/2007   AUGMENTATION MAMMAPLASTY     BACK SURGERY  1986   lower; another scheduled, opt. reports 4 back- lumbar, 3 cerv. fusions  for later 2009   CARDIAC CATHETERIZATION  11/2009   Patent LIMA to LAD and patent SVG to OM1. Occluded SVG to ramus and diagonal. Left main: 90% ostial stenosis, LCX 60-70% proximal stenosis   CATARACT EXTRACTION W/ INTRAOCULAR LENS  IMPLANT, BILATERAL Bilateral 2006   COLONOSCOPY WITH PROPOFOL N/A 02/17/2017   Procedure: COLONOSCOPY WITH PROPOFOL;  Surgeon: Beverley Fiedler, MD;  Location: [facility] ENDOSCOPY;  Service: Endoscopy;  Laterality: N/A;   CORONARY ANGIOPLASTY WITH STENT PLACEMENT  11/2009   Drug eluting stent to left main artery: 4.0 X 12 mm Ion    CORONARY ARTERY BYPASS GRAFT  2004   "CABG X4"   ESOPHAGOGASTRODUODENOSCOPY N/A 02/14/2017   Procedure: ESOPHAGOGASTRODUODENOSCOPY (EGD);  Surgeon: Sherrilyn Rist, MD;  Location: [facility] ENDOSCOPY;  Service: Endoscopy;  Laterality: N/A;   GIVENS CAPSULE STUDY N/A 02/15/2017   Procedure: GIVENS CAPSULE STUDY;  Surgeon: Sherrilyn Rist, MD;  Location: [facility] ENDOSCOPY;  Service: Endoscopy;  Laterality: N/A;   GROIN MASS OPEN BIOPSY  2004   IR IVC FILTER PLMT / S&I /IMG GUID/MOD SED  03/14/2018   IR PARACENTESIS  03/10/2018   JOINT REPLACEMENT      LAPAROSCOPIC CHOLECYSTECTOMY     PERIPHERAL VASCULAR INTERVENTION Left 03/05/2018   Procedure: PERIPHERAL VASCULAR INTERVENTION;  Surgeon: Sherren Kerns, MD;  Location: [facility] INVASIVE CV LAB;  Service: Cardiovascular;  Laterality: Left;  Attempted unsuccess\ful Per Dr. Jettie Booze   PERIPHERAL VASCULAR INTERVENTION  03/08/2018   Procedure: PERIPHERAL VASCULAR INTERVENTION;  Surgeon: Maeola Harman, MD;  Location: [facility] INVASIVE CV LAB;  Service: Cardiovascular;;  SMA Stent    REPLACEMENT TOTAL KNEE BILATERAL Bilateral 2012   REVERSE SHOULDER ARTHROPLASTY  02/26/2012   Procedure: REVERSE SHOULDER ARTHROPLASTY;  Surgeon: Senaida Lange, MD;  Location: [facility] OR;  Service: Orthopedics;  Laterality: Right;  RIGHT SHOULDER REVERSED ARTHROPLASTY  SHOULDER ARTHROSCOPY WITH SUBACROMIAL DECOMPRESSION Left 02/10/2013   Procedure: SHOULDER ARTHROSCOPY WITH SUBACROMIAL DECOMPRESSION DISTAL CLAVICLE RESECTION;  Surgeon: Senaida Lange, MD;  Location: [facility] OR;  Service: Orthopedics;  Laterality: Left;  DISTAL CLAVICLE RESECTION   TONSILLECTOMY     TOTAL HIP ARTHROPLASTY  2007   Right   TOTAL HIP ARTHROPLASTY Left 02/07/2020   Procedure: TOTAL HIP ARTHROPLASTY ANTERIOR APPROACH;  Surgeon: Durene Romans, MD;  Location: [facility] ORS;  Service: Orthopedics;  Laterality: Left;  70 mins   TRANSURETHRAL RESECTION OF BLADDER TUMOR N/A 05/31/2018   Procedure: TRANSURETHRAL RESECTION OF BLADDER TUMOR (TURBT);  Surgeon: Crista Elliot, MD;  Location: [facility] ORS;  Service: Urology;  Laterality: N/A;   VISCERAL ANGIOGRAPHY N/A 03/05/2018   Procedure: VISCERAL ANGIOGRAPHY;  Surgeon: Sherren Kerns, MD;  Location: [facility] INVASIVE CV LAB;  Service: Cardiovascular;  Laterality: N/A;   VISCERAL ANGIOGRAPHY N/A 03/08/2018   Procedure: VISCERAL ANGIOGRAPHY;  Surgeon: Maeola Harman, MD;  Location[facility] INVASIVE CV LAB;  Service: Cardiovascular;  Laterality: N/A;     A IV Location/Drains/Wounds Patient Lines/Drains/Airways Status      Active Line/Drains/Airways     None            Intake/Output Last 24 hours No intake or output data in the 24 hours ending 05/09/23 1629  Labs/Imaging Results for orders placed or performed during the hospital encounter of 05/09/23 (from the past 48 hour(s))  SARS Coronavirus 2 by RT PCR (hospital order, performed in [facility] hospital lab) *cepheid single result test* Anterior Nasal Swab     Status: None   Collection Time: 05/09/23  1:11 PM   Specimen: Anterior Nasal Swab  Result Value Ref Range   SARS Coronavirus 2 by RT PCR NEGATIVE NEGATIVE    Comment: Performed at [facility], [address] [place], [place] [address]  CBC with Differential     Status: Abnormal   Collection Time: 05/09/23  1:28 PM  Result Value Ref Range   WBC 9.8 4.0 - 10.5 K/uL   RBC 4.81 3.87 - 5.11 MIL/uL   Hemoglobin 13.7 12.0 - 15.0 g/dL   HCT 60.4 54.0 - 98.1 %   MCV 92.7 80.0 - 100.0 fL   MCH 28.5 26.0 - 34.0 pg   MCHC 30.7 30.0 - 36.0 g/dL   RDW 19.1 47.8 - 29.5 %   Platelets 197 150 - 400 K/uL   nRBC 0.0 0.0 - 0.2 %   Neutrophils Relative % 75 %   Neutro Abs 7.3 1.7 - 7.7 K/uL   Lymphocytes Relative 11 %   Lymphs Abs 1.1 0.7 - 4.0 K/uL   Monocytes Relative 13 %   Monocytes Absolute 1.3 (H) 0.1 - 1.0 K/uL   Eosinophils Relative 0 %   Eosinophils Absolute 0.0 0.0 - 0.5 K/uL   Basophils Relative 0 %   Basophils Absolute 0.0 0.0 - 0.1 K/uL   Immature Granulocytes 1 %   Abs Immature Granulocytes 0.11 (H) 0.00 - 0.07 K/uL    Comment: Performed at [facility], [address] [place], [place] [address]  Troponin I (High Sensitivity)     Status: Abnormal   Collection Time: 05/09/23  1:28 PM  Result Value Ref Range   Troponin I (High Sensitivity) 23 (H) <18 ng/L    Comment: (NOTE) Elevated high sensitivity troponin I (hsTnI) values and significant  changes across serial measurements may suggest ACS but many other  chronic and acute conditions are  known to elevate hsTnI  results.  Refer to the "Links" section for chest pain algorithms and additional  guidance. Performed at [facility], [address] [place], [place] [address]   Comprehensive metabolic panel     Status: Abnormal   Collection Time: 05/09/23  1:28 PM  Result Value Ref Range   Sodium 136 135 - 145 mmol/L   Potassium 4.2 3.5 - 5.1 mmol/L   Chloride 96 (L) 98 - 111 mmol/L   CO2 25 22 - 32 mmol/L   Glucose, Bld 102 (H) 70 - 99 mg/dL    Comment: Glucose reference range applies only to samples taken after fasting for at least 8 hours.   BUN 20 8 - 23 mg/dL   Creatinine, Ser 2.13 (H) 0.44 - 1.00 mg/dL   Calcium 8.7 (L) 8.9 - 10.3 mg/dL   Total Protein 6.9 6.5 - 8.1 g/dL   Albumin 3.0 (L) 3.5 - 5.0 g/dL   AST 18 15 - 41 U/L   ALT 11 0 - 44 U/L   Alkaline Phosphatase 69 38 - 126 U/L   Total Bilirubin 0.4 0.3 - 1.2 mg/dL   GFR, Estimated 38 (L) >60 mL/min    Comment: (NOTE) Calculated using the CKD-EPI Creatinine Equation (2021)    Anion gap 15 5 - 15    Comment: Performed at [facility], [address] [place], [place] [address]  Brain natriuretic peptide     Status: Abnormal   Collection Time: 05/09/23  1:28 PM  Result Value Ref Range   B Natriuretic Peptide 475.4 (H) 0.0 - 100.0 pg/mL    Comment: Performed at [facility], [address], [place], [place] [address]  Troponin I (High Sensitivity)     Status: Abnormal   Collection Time: 05/09/23  3:11 PM  Result Value Ref Range   Troponin I (High Sensitivity) 27 (H) <18 ng/L    Comment: (NOTE) Elevated high sensitivity troponin I (hsTnI) values and significant  changes across serial measurements may suggest ACS but many other  chronic and acute conditions are known to elevate hsTnI results.  Refer to the "Links" section for chest pain algorithms and additional  guidance. Performed at [facility], [address], [place], [place] [address]    DG Chest Portable 1 View  Result Date:  05/09/2023 CLINICAL DATA:  Cough, COPD EXAM: PORTABLE CHEST 1 VIEW COMPARISON:  06/05/2022 FINDINGS: Single frontal view of the chest demonstrates a stable cardiac silhouette. Stable ectasia and atherosclerosis of the thoracic aorta. Background pleural and parenchymal scarring again noted consistent with history of emphysema. No acute airspace disease, effusion, or pneumothorax. The calcified bilateral breast prostheses. Stable right shoulder arthroplasty. IMPRESSION: 1. Neck on scarring and emphysema.  No acute airspace disease. Electronically Signed   By: Sharlet Salina M.D.   On: 05/09/2023 14:22    Pending Labs Unresulted Labs (From admission, onward)    None       Vitals/Pain Today's Vitals   05/09/23 1319 05/09/23 1330 05/09/23 1530 05/09/23 1539  BP:  (!) 130/104    Pulse:   81 80  Resp:  14 (!) 24 16  Temp: 98 F (36.7 C)     TempSrc: Oral     SpO2:   98% 98%    Isolation Precautions No active isolations  Medications Medications  acetaminophen (TYLENOL) tablet 1,000 mg (has no administration in time range)  allopurinol (ZYLOPRIM) tablet 100 mg (has no administration in time  range)  aspirin EC tablet 81 mg (has no administration in time range)  amLODipine (NORVASC) tablet 2.5 mg (has no administration in time range)  atorvastatin (LIPITOR) tablet 20 mg (has no administration in time range)  furosemide (LASIX) tablet 80 mg (has no administration in time range)  furosemide (LASIX) injection 60 mg (has no administration in time range)  metoprolol tartrate (LOPRESSOR) tablet 12.5 mg (has no administration in time range)  spironolactone (ALDACTONE) tablet 25 mg (has no administration in time range)  ARIPiprazole (ABILIFY) tablet 2 mg (has no administration in time range)  citalopram (CELEXA) tablet 10 mg (has no administration in time range)  donepezil (ARICEPT) tablet 5 mg (has no administration in time range)  traZODone (DESYREL) tablet 150 mg (has no administration in time  range)  dapagliflozin propanediol (FARXIGA) tablet 10 mg (has no administration in time range)  levothyroxine (SYNTHROID) tablet 50 mcg (has no administration in time range)  docusate sodium (COLACE) capsule 100 mg (has no administration in time range)  pantoprazole (PROTONIX) EC tablet 40 mg (has no administration in time range)  polyethylene glycol (MIRALAX / GLYCOLAX) packet 17 g (has no administration in time range)  melatonin tablet 10 mg (has no administration in time range)  cyclobenzaprine (FLEXERIL) tablet 10 mg (has no administration in time range)  gabapentin (NEURONTIN) capsule 100 mg (has no administration in time range)  oxyCODONE (Oxy IR/ROXICODONE) immediate release tablet 5 mg (has no administration in time range)  pramipexole (MIRAPEX) tablet 0.25 mg (has no administration in time range)  potassium chloride SA (KLOR-CON M) CR tablet 20 mEq (has no administration in time range)  albuterol (PROVENTIL) (2.5 MG/3ML) 0.083% nebulizer solution 2.5 mg (has no administration in time range)  mometasone-formoterol (DULERA) 200-5 MCG/ACT inhaler 2 puff (has no administration in time range)  Ipratropium-Albuterol (COMBIVENT) respimat 1 puff (has no administration in time range)  roflumilast (DALIRESP) tablet 500 mcg (has no administration in time range)  pilocarpine (PILOCAR) 1 % ophthalmic solution 1 drop (has no administration in time range)  heparin injection 5,000 Units (has no administration in time range)  doxycycline (VIBRA-TABS) tablet 100 mg (has no administration in time range)  methylPREDNISolone sodium succinate (SOLU-MEDROL) 40 mg/mL injection 40 mg (has no administration in time range)    Followed by  predniSONE (DELTASONE) tablet 40 mg (has no administration in time range)  ipratropium-albuterol (DUONEB) 0.5-2.5 (3) MG/3ML nebulizer solution 3 mL (3 mLs Nebulization Given 05/09/23 1332)  albuterol (PROVENTIL) (2.5 MG/3ML) 0.083% nebulizer solution 2.5 mg (2.5 mg  Nebulization Given 05/09/23 1332)    Mobility walks     Focused Assessments    R Recommendations: See Admitting Provider Note  Report given to:   Additional Notes:

## 2023-05-09 NOTE — ED Provider Notes (Signed)
[facility] EMERGENCY DEPARTMENT AT [facility] Provider Note   CSN: [id number] Arrival date & time: 05/09/23  1243     History  Chief Complaint  Patient presents with   Shortness of Breath    [name] is a 82 y.o. female.  HPI 82 year old female with a history of COPD, CHF and multiple other comorbidities presents with shortness of breath.  History is from patient but also the granddaughter at the bedside.  Patient has been dealing with symptoms for about 3 days.  She chronically has shortness of breath but is much worse and now she is having yellow sputum.  No chest pain.  A little bit of ankle swelling that is worse than baseline but not nearly as bad as it has been in the past.  No fevers.  She went to urgent care and her O2 sats were in the 90 range and so she was put on oxygen (she takes this as needed at home) and given a Neb without relief.  She was given 80 mg IM Kenalog.  Chest x-ray was reportedly negative.  Point-of-care COVID/flu/strep testing was all negative.  Patient feels like she is not much better though the oxygen has helped a little.  Home Medications Prior to Admission medications   Medication Sig Start Date End Date Taking? Authorizing Provider  acetaminophen (TYLENOL) 500 MG tablet 1000 mg by oral route. 02/08/20   [provider]  albuterol (PROVENTIL) (2.5 MG/3ML) 0.083% nebulizer solution Take 2.5 mg by nebulization every 6 (six) hours as needed for wheezing or shortness of breath.    [provider]  allopurinol (ZYLOPRIM) 100 MG tablet TAKE 1 TABLET BY MOUTH DAILY WITH BREAKFAST. 05/08/23   Crist Fat, MD  amLODipine (NORVASC) 2.5 MG tablet Take 1/2 tablet as needed for BP greater than 160. 02/24/22   Pricilla Riffle, MD  ARIPiprazole (ABILIFY) 2 MG tablet TAKE 1 TABLET BY MOUTH ONCE DAILY 03/05/23   Crist Fat, MD  aspirin EC 81 MG tablet Take 81 mg by mouth daily with breakfast. Swallow whole.    [provider]  atorvastatin (LIPITOR) 20 MG tablet TAKE 1 TABLET ONCE DAILY AT BEDTIME 05/08/23   Crist Fat, MD  cholecalciferol (VITAMIN D3) 25 MCG (1000 UNIT) tablet Take 1 tablet (1,000 Units total) by mouth daily with supper. 11/11/22   Crist Fat, MD  citalopram (CELEXA) 10 MG tablet TAKE 1 TABLET BY MOUTH ONCE DAILY 05/08/23   Leonia Reader, Barbara Cower, MD  cyclobenzaprine (FLEXERIL) 10 MG tablet Take 10 mg by mouth 3 (three) times daily as needed for muscle spasms.    [provider]  D3-1000 25 MCG (1000 UT) capsule TAKE 1 CAPSULE BY MOUTH DAILY WITH SUPPER. 05/08/23   Leonia Reader, Barbara Cower, MD  dapagliflozin propanediol (FARXIGA) 10 MG TABS tablet Take 1 tablet (10 mg total) by mouth daily before breakfast. 07/07/22   Pricilla Riffle, MD  Docusate Sodium (DSS) 100 MG CAPS Take 100 mg by mouth daily as needed (constipation).    [provider]  donepezil (ARICEPT) 10 MG tablet Take 1 tablet (10 mg total) by mouth at bedtime. 05/30/23   Crist Fat, MD  donepezil (ARICEPT) 5 MG tablet Take 1 tablet (5 mg total) by mouth at bedtime. 04/30/23   Crist Fat, MD  ferrous sulfate 325 (65 FE) MG tablet TAKE 1 TABLET 2 TIMES PER DAY 05/08/23   Crist Fat, MD  fluticasone-salmeterol (ADVAIR DISKUS)  250-50 MCG/ACT AEPB Inhale 1 puff into the lungs in the morning and at bedtime. 01/08/23   Crist Fat, MD  furosemide (LASIX) 80 MG tablet Take 80 mg by mouth every other day.    [provider]  gabapentin (NEURONTIN) 100 MG capsule TAKE 2 CAPSULES BY MOUTH TWICE DAILY 04/20/23   Leonia Reader, Barbara Cower, MD  Ipratropium-Albuterol (COMBIVENT RESPIMAT) 20-100 MCG/ACT AERS respimat Inhale 1 puff into the lungs 3 (three) times daily as needed for wheezing or shortness of breath.    [provider]  levothyroxine (SYNTHROID) 50 MCG tablet TAKE 1 TABLET BY MOUTH DAILY BEFORE BREAKFAST. 11/07/22   Pricilla Riffle, MD  magnesium gluconate (MAGONATE) 500 MG tablet Take 500 mg by mouth daily.    [provider]  Magnesium Oxide -Mg Supplement 500 MG CAPS TAKE 1 CAPSULE BY MOUTH DAILY 03/19/23   Leonia Reader, Barbara Cower, MD  Melatonin 10 MG TABS Take 30 mg by mouth at bedtime.    [provider]  metolazone (ZAROXOLYN) 2.5 MG tablet Take 1 tablet (2.5 mg total) by mouth once a week. Take 30 minutes before Lasix (furosemide). 02/27/22   Pricilla Riffle, MD  metoprolol tartrate (LOPRESSOR) 25 MG tablet TAKE 1/2 TABLET BY MOUTH TWICE DAILY 03/27/23   Leonia Reader, Barbara Cower, MD  nitroGLYCERIN (NITROSTAT) 0.4 MG SL tablet Place 1 tablet (0.4 mg total) under the tongue every 5 (five) minutes as needed for chest pain. For chest pain 04/24/16   Pricilla Riffle, MD  Omega-3 Fatty Acids (FISH OIL) 1000 MG CPDR Take 1,000 mg by mouth daily.    [provider]  OXYGEN Inhale 4 L/min into the lungs as needed (DURING ALL TIMES OF EXERTION).     [provider]  pantoprazole (PROTONIX) 40 MG tablet TAKE 1 TABLET ONCE DAILY 04/20/23   Leonia Reader, Barbara Cower, MD  pilocarpine [facility]) 1 % ophthalmic solution Place 1 drop into both eyes 2 (two) times daily.    [provider]  polyethylene glycol (MIRALAX / GLYCOLAX) 17 g packet Take 17 g by mouth as needed. 02/08/20   [provider]  potassium chloride SA (KLOR-CON M) 20 MEQ tablet TAKE 1 TABLET ONCE DAILY WITH FOOD 02/18/23   Leonia Reader, Barbara Cower, MD  pramipexole (MIRAPEX) 0.25 MG tablet TAKE 1 TABLET 2-3 HOURS BEFORE BEDTIME 04/09/23   Leonia Reader, Barbara Cower, MD  roflumilast (DALIRESP) 500 MCG TABS tablet TAKE 1 TABLET ONCE DAILY 04/09/23   Leonia Reader, Barbara Cower, MD  spironolactone (ALDACTONE) 25 MG tablet TAKE 1 TABLET BY MOUTH DAILY. 12/17/22   Pricilla Riffle, MD  traZODone (DESYREL) 50 MG tablet TAKE 3 TABLETS EVERY NIGHT AT BEDTIME. 04/09/23   Leonia Reader, Barbara Cower, MD  vitamin C (ASCORBIC ACID) 500 MG tablet Take 500 mg by mouth daily with lunch.    [provider]  XTAMPZA ER 9 MG C12A Take 9 mg by mouth in the morning, at noon, and at bedtime. 04/30/23   Crist Fat, MD      Allergies    Ativan [lorazepam], Pitavastatin, Ropinirole, Zofran [facility] hcl], Zolpidem tartrate, Zolpidem tartrate, and Penicillins    Review of Systems   Review of Systems  Constitutional:  Negative for fever.  Respiratory:  Positive for cough and shortness of breath.   Cardiovascular:  Positive for leg swelling. Negative for chest pain.  Gastrointestinal:  Negative for abdominal pain.    Physical Exam Updated Vital Signs BP (!) 130/104   Pulse 76  Temp 98 F (36.7 C) (Oral)   Resp 14   SpO2 97%  Physical Exam Vitals and nursing note reviewed.  Constitutional:      General: She is not in acute distress.    Appearance: She is well-developed. She is not ill-appearing or diaphoretic.  HENT:     Head: Normocephalic and atraumatic.  Cardiovascular:     Rate and Rhythm: Normal rate and regular rhythm.     Heart sounds: Normal heart sounds.  Pulmonary:     Effort: Pulmonary effort is normal. No accessory muscle usage or respiratory distress.     Breath sounds: Wheezing (diffuse, expiratory) present.  Abdominal:     Palpations: Abdomen is soft.     Tenderness: There is no abdominal tenderness.  Musculoskeletal:     Comments: Perhaps some mild pedal edema  Skin:    General: Skin is warm and dry.  Neurological:     Mental Status: She is alert.     ED Results / Procedures / Treatments   Labs (all labs ordered are listed, but only abnormal results are displayed) Labs Reviewed  CBC WITH DIFFERENTIAL/PLATELET - Abnormal; Notable for the following components:      Result Value   Monocytes Absolute 1.3 (*)    Abs Immature Granulocytes 0.11 (*)    All other components within normal limits  COMPREHENSIVE METABOLIC PANEL - Abnormal; Notable for the following components:   Chloride 96 (*)    Glucose, Bld 102 (*)    Creatinine, Ser 1.39 (*)    Calcium 8.7 (*)    Albumin 3.0 (*)    GFR, Estimated 38 (*)    All other components within normal limits   BRAIN NATRIURETIC PEPTIDE - Abnormal; Notable for the following components:   B Natriuretic Peptide 475.4 (*)    All other components within normal limits  TROPONIN I (HIGH SENSITIVITY) - Abnormal; Notable for the following components:   Troponin I (High Sensitivity) 23 (*)    All other components within normal limits  SARS CORONAVIRUS 2 BY RT PCR  TROPONIN I (HIGH SENSITIVITY)    EKG EKG Interpretation  Date/Time:  Saturday May 09 2023 13:17:17 EDT Ventricular Rate:  78 PR Interval:  192 QRS Duration: 153 QT Interval:  447 QTC Calculation: 510 R Axis:   -80 Text Interpretation: Sinus rhythm Ventricular premature complex RBBB and LAFB Probable left ventricular hypertrophy Confirmed by Pricilla Loveless [phone]) on 05/09/2023 1:22:33 PM  Radiology DG Chest Portable 1 View  Result Date: 05/09/2023 CLINICAL DATA:  Cough, COPD EXAM: PORTABLE CHEST 1 VIEW COMPARISON:  06/05/2022 FINDINGS: Single frontal view of the chest demonstrates a stable cardiac silhouette. Stable ectasia and atherosclerosis of the thoracic aorta. Background pleural and parenchymal scarring again noted consistent with history of emphysema. No acute airspace disease, effusion, or pneumothorax. The calcified bilateral breast prostheses. Stable right shoulder arthroplasty. IMPRESSION: 1. Neck on scarring and emphysema.  No acute airspace disease. Electronically Signed   By: Sharlet Salina M.D.   On: 05/09/2023 14:22    Procedures Procedures    Medications Ordered in ED Medications  ipratropium-albuterol (DUONEB) 0.5-2.5 (3) MG/3ML nebulizer solution 3 mL (3 mLs Nebulization Given 05/09/23 1332)  albuterol (PROVENTIL) (2.5 MG/3ML) 0.083% nebulizer solution 2.5 mg (2.5 mg Nebulization Given 05/09/23 1332)    ED Course/ Medical Decision Making/ A&P                             Medical  Decision Making Amount and/or Complexity of Data Reviewed Labs: ordered.    Details: Mild troponin leak, likely related to the COPD  exacerbation/hypoxia.  Normal WBC.  BNP elevated at 475.  COVID-negative. Radiology: ordered and independent interpretation performed.    Details: No overt pneumonia or CHF ECG/medicine tests: ordered and independent interpretation performed.    Details: Bundle branch block but no obvious ischemia.  Risk Prescription drug management. Decision regarding hospitalization.   Patient presents with what appears to be a COPD exacerbation.  Might have some mild mix of CHF as well.  Otherwise she is hemodynamically stable and not ill-appearing.  Was given a breathing treatment and has had a little bit of improvement but is still significantly wheezing.  Will need further treatments.  Discussed with pharmacy, can start steroids tomorrow after her dose of Kenalog at the urgent care.  Otherwise she is not distress but I think will need admission.  No cough cause such as pneumonia.        Final Clinical Impression(s) / ED Diagnoses Final diagnoses:  COPD exacerbation [facility])    Rx / DC Orders ED Discharge Orders     None         Pricilla Loveless, MD 05/09/23 1523

## 2023-05-09 NOTE — ED Triage Notes (Signed)
Pt BIB [facility] from an urgent care d/t SOB that started 4 days ago. Pt wears 4L as needed at home. Urgent had pt on 2L, and she was sating around 87%. A&O X4. Covid was negative at urgent care. Hx COPD.

## 2023-05-10 ENCOUNTER — Observation Stay (HOSPITAL_COMMUNITY): Payer: Medicare HMO

## 2023-05-10 DIAGNOSIS — I480 Paroxysmal atrial fibrillation: Secondary | ICD-10-CM | POA: Diagnosis present

## 2023-05-10 DIAGNOSIS — I2489 Other forms of acute ischemic heart disease: Secondary | ICD-10-CM | POA: Diagnosis not present

## 2023-05-10 DIAGNOSIS — K7460 Unspecified cirrhosis of liver: Secondary | ICD-10-CM | POA: Diagnosis present

## 2023-05-10 DIAGNOSIS — E1122 Type 2 diabetes mellitus with diabetic chronic kidney disease: Secondary | ICD-10-CM | POA: Diagnosis present

## 2023-05-10 DIAGNOSIS — E039 Hypothyroidism, unspecified: Secondary | ICD-10-CM | POA: Diagnosis present

## 2023-05-10 DIAGNOSIS — Z1152 Encounter for screening for COVID-19: Secondary | ICD-10-CM | POA: Diagnosis not present

## 2023-05-10 DIAGNOSIS — I5043 Acute on chronic combined systolic (congestive) and diastolic (congestive) heart failure: Secondary | ICD-10-CM | POA: Diagnosis present

## 2023-05-10 DIAGNOSIS — J449 Chronic obstructive pulmonary disease, unspecified: Secondary | ICD-10-CM | POA: Diagnosis present

## 2023-05-10 DIAGNOSIS — F32A Depression, unspecified: Secondary | ICD-10-CM | POA: Diagnosis present

## 2023-05-10 DIAGNOSIS — E1151 Type 2 diabetes mellitus with diabetic peripheral angiopathy without gangrene: Secondary | ICD-10-CM | POA: Diagnosis present

## 2023-05-10 DIAGNOSIS — J439 Emphysema, unspecified: Secondary | ICD-10-CM | POA: Diagnosis present

## 2023-05-10 DIAGNOSIS — J441 Chronic obstructive pulmonary disease with (acute) exacerbation: Secondary | ICD-10-CM | POA: Diagnosis not present

## 2023-05-10 DIAGNOSIS — I509 Heart failure, unspecified: Secondary | ICD-10-CM | POA: Diagnosis not present

## 2023-05-10 DIAGNOSIS — Z794 Long term (current) use of insulin: Secondary | ICD-10-CM | POA: Diagnosis not present

## 2023-05-10 DIAGNOSIS — E785 Hyperlipidemia, unspecified: Secondary | ICD-10-CM | POA: Diagnosis present

## 2023-05-10 DIAGNOSIS — N1832 Chronic kidney disease, stage 3b: Secondary | ICD-10-CM | POA: Diagnosis present

## 2023-05-10 DIAGNOSIS — Z66 Do not resuscitate: Secondary | ICD-10-CM | POA: Diagnosis present

## 2023-05-10 DIAGNOSIS — Z6833 Body mass index (BMI) 33.0-33.9, adult: Secondary | ICD-10-CM | POA: Diagnosis not present

## 2023-05-10 DIAGNOSIS — Z87891 Personal history of nicotine dependence: Secondary | ICD-10-CM | POA: Diagnosis not present

## 2023-05-10 DIAGNOSIS — G2581 Restless legs syndrome: Secondary | ICD-10-CM | POA: Diagnosis present

## 2023-05-10 DIAGNOSIS — I130 Hypertensive heart and chronic kidney disease with heart failure and stage 1 through stage 4 chronic kidney disease, or unspecified chronic kidney disease: Secondary | ICD-10-CM | POA: Diagnosis present

## 2023-05-10 DIAGNOSIS — R0602 Shortness of breath: Secondary | ICD-10-CM | POA: Diagnosis not present

## 2023-05-10 DIAGNOSIS — Z96642 Presence of left artificial hip joint: Secondary | ICD-10-CM | POA: Diagnosis present

## 2023-05-10 DIAGNOSIS — I25810 Atherosclerosis of coronary artery bypass graft(s) without angina pectoris: Secondary | ICD-10-CM | POA: Diagnosis present

## 2023-05-10 DIAGNOSIS — J9621 Acute and chronic respiratory failure with hypoxia: Secondary | ICD-10-CM | POA: Diagnosis present

## 2023-05-10 DIAGNOSIS — E43 Unspecified severe protein-calorie malnutrition: Secondary | ICD-10-CM | POA: Diagnosis present

## 2023-05-10 DIAGNOSIS — Z79890 Hormone replacement therapy (postmenopausal): Secondary | ICD-10-CM | POA: Diagnosis not present

## 2023-05-10 LAB — BASIC METABOLIC PANEL
Anion gap: 10 (ref 5–15)
BUN: 28 mg/dL — ABNORMAL HIGH (ref 8–23)
CO2: 30 mmol/L (ref 22–32)
Calcium: 8.6 mg/dL — ABNORMAL LOW (ref 8.9–10.3)
Chloride: 98 mmol/L (ref 98–111)
Creatinine, Ser: 1.35 mg/dL — ABNORMAL HIGH (ref 0.44–1.00)
GFR, Estimated: 39 mL/min — ABNORMAL LOW (ref >60)
Glucose, Bld: 200 mg/dL — ABNORMAL HIGH (ref 70–99)
Potassium: 4.5 mmol/L (ref 3.5–5.1)
Sodium: 138 mmol/L (ref 135–145)

## 2023-05-10 MED ORDER — METHYLPREDNISOLONE SODIUM SUCC 40 MG IJ SOLR
40.0000 mg | Freq: Two times a day (BID) | INTRAMUSCULAR | Status: DC
  Administered 2023-05-10 – 2023-05-13 (×6): 40 mg via INTRAVENOUS
  Filled 2023-05-10 (×6): qty 1

## 2023-05-10 NOTE — Progress Notes (Signed)
PROGRESS NOTE    [name]  ZOX:[id number] DOB: 1941/01/03 DOA: 05/09/2023 PCP: Crist Fat, MD     Brief Narrative:  [name] is a 82 y.o. female with medical history significant of COPD Gold stage III with chronic restrictive lung disease, chronic hypoxic respite failure on 4 L as needed for activity, PAF, CAD status post CABG, chronic HFrEF, HTN, CKD stage IIIa, IIDM, presented with worsening of cough wheezing shortness of breath.   Symptoms started 3 days ago, with new onset of cough initially was dry then turned productive since yesterday with light yellowish sputum, along with wheezing and increasing exertional dyspnea.  Started to use home oxygen 4 L continuously with no significant improvement.  Started to have continuous shortness of breath overnight unable to sleep.  Denies any chest pain, no fever or chills.  Denies any peripheral edema.  No recent medication changes.  Today, patient went to urgent care was found to be in COPD exacerbation was given 1 dose of steroid and sent to ED.   Patient was found to be hypoxic in the ED with saturation 87% 2 L, and patient was given multiple rounds of DuoNebs but continued to experience shortness of breath and wheezy.  Chest x-ray showed no acute findings.  New events last 24 hours / Subjective: Patient sitting in bed, states that she does not need oxygen all the time.  Has been very short of breath with activity.  Also with productive cough of yellow sputum.  States that she has been around her son who has a dog who sheds  Assessment & Plan:   Principal Problem:   COPD exacerbation (HCC) Active Problems:   Hypothyroidism   Dyslipidemia   Acute on chronic respiratory failure with hypoxia (HCC)   Chronic diastolic heart failure (HCC)   GERD without esophagitis   CAD (coronary artery disease) of bypass graft   Prolonged QT interval   Depression   Memory loss   Paroxysmal atrial fibrillation (HCC)   COPD  exacerbation with acute on chronic hypoxic respiratory failure -COVID-negative -Chest x-ray reviewed independently, without overt opacity or edema -Continue nasal cannula O2 as needed to maintain sats 88 to 92%.  Currently requiring 3 L nasal cannula O2 -Continue Solu-Medrol, Doxycycline, breathing treatments  Chronic diastolic heart failure -Without evidence of acute exacerbation  -Resume home Lasix  Hypertension -Amlodipine, Lopressor, Aldactone, Lasix  Paroxysmal A-fib -Not on anticoagulation due to history of severe GI bleed -Lopressor  CKD stage IIIb -Baseline creatinine 1.3 -Repeat BMP pending this morning  Hypothyroidism -Synthroid  CAD Demand ischemia -Troponin 23, 27 -Aspirin, Lipitor  Type 2 diabetes  Prolonged Qtc -Repeat EKG reviewed independently, normal sinus rhythm with PVC, left axis deviation, QTc 510   DVT prophylaxis:  heparin injection 5,000 Units Start: 05/09/23 2200  Code Status: DNR Family Communication: At bedside Disposition Plan: Home Status is: Observation The patient will require care spanning > 2 midnights and should be moved to inpatient because: Continued dyspnea    Antimicrobials:  Anti-infectives (From admission, onward)    Start     Dose/Rate Route Frequency Ordered Stop   05/09/23 2200  doxycycline (VIBRA-TABS) tablet 100 mg        100 mg Oral Every 12 hours 05/09/23 1602 05/14/23 2159        Objective: Vitals:   05/09/23 1936 05/09/23 1959 05/10/23 0443 05/10/23 0731  BP: (!) 132/46  (!) 127/46 (!) 119/52  Pulse: 86  69 71  Resp: 17  17   Temp: 98.1 F (36.7 C)  98 F (36.7 C) (!) 97.5 F (36.4 C)  TempSrc: Oral   Oral  SpO2: 96% 96% 98% 92%    Intake/Output Summary (Last 24 hours) at 05/10/2023 1042 Last data filed at 05/10/2023 0600 Gross per 24 hour  Intake 237 ml  Output 750 ml  Net -513 ml   There were no vitals filed for this visit.  Examination:  General exam: Appears calm and comfortable   Respiratory system: Expiratory wheezes bilaterally, some mild conversational dyspnea Cardiovascular system: S1 & S2 heard, RRR. No murmurs. No pedal edema. Gastrointestinal system: Abdomen is nondistended, soft and nontender. Normal bowel sounds heard. Central nervous system: Alert and oriented. No focal neurological deficits. Speech clear.  Extremities: Symmetric in appearance  Skin: No rashes, lesions or ulcers on exposed skin  Psychiatry: Judgement and insight appear normal. Mood & affect appropriate.   Data Reviewed: I have personally reviewed following labs and imaging studies  CBC: Recent Labs  Lab 05/09/23 1328  WBC 9.8  NEUTROABS 7.3  HGB 13.7  HCT 44.6  MCV 92.7  PLT 197   Basic Metabolic Panel: Recent Labs  Lab 05/09/23 1328  NA 136  K 4.2  CL 96*  CO2 25  GLUCOSE 102*  BUN 20  CREATININE 1.39*  CALCIUM 8.7*   GFR: Estimated Creatinine Clearance: 30.8 mL/min (A) (by C-G formula based on SCr of 1.39 mg/dL (H)). Liver Function Tests: Recent Labs  Lab 05/09/23 1328  AST 18  ALT 11  ALKPHOS 69  BILITOT 0.4  PROT 6.9  ALBUMIN 3.0*   No results for input(s): "LIPASE", "AMYLASE" in the last 168 hours. No results for input(s): "AMMONIA" in the last 168 hours. Coagulation Profile: No results for input(s): "INR", "PROTIME" in the last 168 hours. Cardiac Enzymes: No results for input(s): "CKTOTAL", "CKMB", "CKMBINDEX", "TROPONINI" in the last 168 hours. BNP (last 3 results) Recent Labs    03/02/23 1146  PROBNP 1,203*   HbA1C: No results for input(s): "HGBA1C" in the last 72 hours. CBG: No results for input(s): "GLUCAP" in the last 168 hours. Lipid Profile: No results for input(s): "CHOL", "HDL", "LDLCALC", "TRIG", "CHOLHDL", "LDLDIRECT" in the last 72 hours. Thyroid Function Tests: No results for input(s): "TSH", "T4TOTAL", "FREET4", "T3FREE", "THYROIDAB" in the last 72 hours. Anemia Panel: No results for input(s): "VITAMINB12", "FOLATE",  "FERRITIN", "TIBC", "IRON", "RETICCTPCT" in the last 72 hours. Sepsis Labs: No results for input(s): "PROCALCITON", "LATICACIDVEN" in the last 168 hours.  Recent Results (from the past 240 hour(s))  SARS Coronavirus 2 by RT PCR (hospital order, performed in [facility] hospital lab) *cepheid single result test* Anterior Nasal Swab     Status: None   Collection Time: 05/09/23  1:11 PM   Specimen: Anterior Nasal Swab  Result Value Ref Range Status   SARS Coronavirus 2 by RT PCR NEGATIVE NEGATIVE Final    Comment: Performed at [facility], [address], [place], [place] [address]      Radiology Studies: DG Chest 1 View  Result Date: 05/10/2023 CLINICAL DATA:  CHF, shortness of breath EXAM: CHEST  1 VIEW COMPARISON:  05/09/2023 FINDINGS: Prior CABG. Cardiomegaly, aortic atherosclerosis. Biapical scarring. No acute confluent opacities, effusions or edema. No acute bony abnormality IMPRESSION: Cardiomegaly.  Chronic changes within the lungs.  No active disease. Electronically Signed   By: Charlett Nose M.D.   On: 05/10/2023 07:34   DG Chest Portable 1 View  Result Date: 05/09/2023 CLINICAL  DATA:  Cough, COPD EXAM: PORTABLE CHEST 1 VIEW COMPARISON:  06/05/2022 FINDINGS: Single frontal view of the chest demonstrates a stable cardiac silhouette. Stable ectasia and atherosclerosis of the thoracic aorta. Background pleural and parenchymal scarring again noted consistent with history of emphysema. No acute airspace disease, effusion, or pneumothorax. The calcified bilateral breast prostheses. Stable right shoulder arthroplasty. IMPRESSION: 1. Neck on scarring and emphysema.  No acute airspace disease. Electronically Signed   By: Sharlet Salina M.D.   On: 05/09/2023 14:22      Scheduled Meds:  allopurinol  100 mg Oral Q breakfast   amLODipine  2.5 mg Oral Daily   ARIPiprazole  2 mg Oral Daily   aspirin EC  81 mg Oral Q breakfast   atorvastatin  20 mg Oral Daily   citalopram  10 mg Oral  Daily   donepezil  5 mg Oral QHS   doxycycline  100 mg Oral Q12H   furosemide  80 mg Oral QODAY   gabapentin  100 mg Oral BID   heparin  5,000 Units Subcutaneous Q12H   levothyroxine  50 mcg Oral QAC breakfast   melatonin  10 mg Oral QHS   metoprolol tartrate  12.5 mg Oral BID   mometasone-formoterol  2 puff Inhalation BID   pantoprazole  40 mg Oral Daily   pilocarpine  1 drop Both Eyes BID   potassium chloride SA  20 mEq Oral Daily   pramipexole  0.25 mg Oral QHS   [START ON 05/11/2023] predniSONE  40 mg Oral Q breakfast   roflumilast  500 mcg Oral Daily   spironolactone  25 mg Oral Daily   traZODone  150 mg Oral QHS   Continuous Infusions:   LOS: 0 days   Time spent: 30 minutes   Noralee Stain, DO [facility] 05/10/2023, 10:42 AM   Available via Epic secure chat 7am-7pm After these hours, please refer to coverage provider listed on [email or web address]

## 2023-05-11 DIAGNOSIS — J441 Chronic obstructive pulmonary disease with (acute) exacerbation: Secondary | ICD-10-CM

## 2023-05-11 LAB — GLUCOSE, CAPILLARY
Glucose-Capillary: 150 mg/dL — ABNORMAL HIGH (ref 70–99)
Glucose-Capillary: 151 mg/dL — ABNORMAL HIGH (ref 70–99)
Glucose-Capillary: 127 mg/dL — ABNORMAL HIGH (ref 70–99)

## 2023-05-11 MED ORDER — GUAIFENESIN ER 600 MG PO TB12
600.0000 mg | ORAL_TABLET | Freq: Two times a day (BID) | ORAL | Status: DC | PRN
  Administered 2023-05-12: 600 mg via ORAL
  Filled 2023-05-11 (×2): qty 1

## 2023-05-11 MED ORDER — INSULIN ASPART 100 UNIT/ML IJ SOLN
0.0000 [IU] | Freq: Three times a day (TID) | INTRAMUSCULAR | Status: DC
  Administered 2023-05-11: 2 [IU] via SUBCUTANEOUS
  Administered 2023-05-11 – 2023-05-12 (×2): 1 [IU] via SUBCUTANEOUS
  Administered 2023-05-12: 2 [IU] via SUBCUTANEOUS
  Administered 2023-05-13 (×2): 1 [IU] via SUBCUTANEOUS
  Administered 2023-05-14 (×2): 2 [IU] via SUBCUTANEOUS
  Administered 2023-05-15: 1 [IU] via SUBCUTANEOUS

## 2023-05-11 NOTE — Evaluation (Signed)
Occupational Therapy Evaluation Patient Details Name: [name] MRN: [id number] DOB: 1941-12-05 Today's Date: 05/11/2023   History of Present Illness 82 y.o. female presents to [facility] hospital on 05/09/2023 with cough, wheezing, SOB. Pt admitted for COPD exacerbation. PMH includes COPD, chronic respiratory failure on 3L [place], PAF, CABG, chronic HFrEF, HTN, CKDIII, DMII.   Clinical Impression   Patient admitted for the diagnosis above.  PTA she lives at home alone, but has good family support.  Patient's biggest deficit is poor activity tolerance, but she manages to remain fairly independent at home.  Patient has good awareness of her SOB, and takes rest breaks as needed.  Currently she is needing Min A for lower body dressing, but at baseline she uses adaptive equipment.  In addition, she is needing generalized supervision for in room mobility at a RW level.  OT is indicated in the acute setting to address deficits, but no post acute OT is anticipated given family support.        Recommendations for follow up therapy are one component of a multi-disciplinary discharge planning process, led by the attending physician.  Recommendations may be updated based on patient status, additional functional criteria and insurance authorization.   Assistance Recommended at Discharge Intermittent Supervision/Assistance  Patient can return home with the following Assist for transportation;Assistance with cooking/housework    Functional Status Assessment  Patient has had a recent decline in their functional status and demonstrates the ability to make significant improvements in function in a reasonable and predictable amount of time.  Equipment Recommendations  None recommended by OT    Recommendations for Other Services       Precautions / Restrictions Precautions Precautions: Fall Restrictions Weight Bearing Restrictions: No      Mobility Bed Mobility Overal bed mobility: Needs Assistance Bed  Mobility: Supine to Sit, Sit to Supine     Supine to sit: Supervision Sit to supine: Supervision        Transfers Overall transfer level: Needs assistance Equipment used: Rolling walker (2 wheels) Transfers: Sit to/from Stand, Bed to chair/wheelchair/BSC Sit to Stand: Supervision     Step pivot transfers: Supervision            Balance Overall balance assessment: Needs assistance Sitting-balance support: No upper extremity supported, Feet supported Sitting balance-Leahy Scale: Good     Standing balance support: Reliant on assistive device for balance Standing balance-Leahy Scale: Fair                             ADL either performed or assessed with clinical judgement   ADL       Grooming: Wash/dry hands;Wash/dry face;Set up;Sitting               Lower Body Dressing: Minimal assistance;Sit to/from stand Lower Body Dressing Details (indicate cue type and reason): uses LH Reacher at baseline Toilet Transfer: Supervision/safety;Rolling walker (2 wheels);Ambulation                   Vision Patient Visual Report: No change from baseline       Perception     Praxis      Pertinent Vitals/Pain Pain Assessment Pain Assessment: No/denies pain     Hand Dominance Right   Extremity/Trunk Assessment Upper Extremity Assessment Upper Extremity Assessment: Generalized weakness   Lower Extremity Assessment Lower Extremity Assessment: Defer to PT evaluation   Cervical / Trunk Assessment Cervical / Trunk Assessment: Kyphotic   Communication  Communication Communication: No difficulties   Cognition Arousal/Alertness: Awake/alert Behavior During Therapy: WFL for tasks assessed/performed Overall Cognitive Status: Within Functional Limits for tasks assessed                                       General Comments  pt on 2L [place] upon PT arrival, pt reports wearing 3L [place] most of the time at home. Pt sats in mid 90s on 3L  when  ambulating, does report significant DOE, with some coughing/wheezing at completion of ambulation. Pt recovers quickly from this.    Exercises     Shoulder Instructions      Home Living Family/patient expects to be discharged to:: Private residence Living Arrangements: Alone Available Help at Discharge: Family;Available 24 hours/day;Other (Comment) Type of Home: House Home Access: Stairs to enter [employer] of Steps: 2 Entrance Stairs-Rails: Right;Left;Can reach both Home Layout: One level     Bathroom Shower/Tub: Producer, television/film/video: Handicapped height Bathroom Accessibility: Yes How Accessible: Accessible via walker Home Equipment: Cane - single point;Rollator (4 wheels);Shower seat;BSC/3in1          Prior Functioning/Environment Prior Level of Function : Independent/Modified Independent             Mobility Comments: ambulates with rollator ADLs Comments: Completes her own ADL and iADL, showers without O2 on.        OT Problem List: Decreased activity tolerance;Impaired balance (sitting and/or standing)      OT Treatment/Interventions: Self-care/ADL training;Therapeutic activities;Patient/family education;Balance training;DME and/or AE instruction;Energy conservation    OT Goals(Current goals can be found in the care plan section) Acute Rehab OT Goals Patient Stated Goal: Return home OT Goal Formulation: With patient Time For Goal Achievement: 05/25/23 Potential to Achieve Goals: Good ADL Goals Pt Will Perform Grooming: with modified independence;standing Pt Will Perform Lower Body Dressing: with modified independence;sit to/from stand Pt Will Transfer to Toilet: with modified independence;ambulating;regular height toilet  OT Frequency: Min 2X/week    Co-evaluation              AM-PAC OT "6 Clicks" Daily Activity     Outcome Measure Help from another person eating meals?: None Help from another person taking care of  personal grooming?: A Little Help from another person toileting, which includes using toliet, bedpan, or urinal?: A Little Help from another person bathing (including washing, rinsing, drying)?: A Little Help from another person to put on and taking off regular upper body clothing?: None Help from another person to put on and taking off regular lower body clothing?: A Little 6 Click Score: 20   End of Session Equipment Utilized During Treatment: Rolling walker (2 wheels);Oxygen Nurse Communication: Mobility status  Activity Tolerance: Patient tolerated treatment well Patient left: in bed;with call bell/phone within reach  OT Visit Diagnosis: Unsteadiness on feet (R26.81)                Time: 4401-0272 OT Time Calculation (min): 17 min Charges:  OT General Charges $OT Visit: 1 Visit OT Evaluation $OT Eval Moderate Complexity: 1 Mod  05/11/2023  RP, OTR/L  Acute Rehabilitation Services  Office:  [phone]   [name] 05/11/2023, 3:29 PM

## 2023-05-11 NOTE — Progress Notes (Signed)
PROGRESS NOTE    [name]  WUJ:[id number] DOB: 10/08/1941 DOA: 05/09/2023 PCP: Crist Fat, MD     Brief Narrative:  [name] is a 82 y.o. female with medical history significant of COPD Gold stage III with chronic restrictive lung disease, chronic hypoxic respite failure on 4 L as needed for activity, PAF, CAD status post CABG, chronic HFrEF, HTN, CKD stage IIIa, IIDM, presented with worsening of cough wheezing shortness of breath.   Symptoms started 3 days ago, with new onset of cough initially was dry then turned productive since yesterday with light yellowish sputum, along with wheezing and increasing exertional dyspnea.  Started to use home oxygen 4 L continuously with no significant improvement.  Started to have continuous shortness of breath overnight unable to sleep.  Denies any chest pain, no fever or chills.  Denies any peripheral edema.  No recent medication changes.  Today, patient went to urgent care was found to be in COPD exacerbation was given 1 dose of steroid and sent to ED.   Patient was found to be hypoxic in the ED with saturation 87% 2 L, and patient was given multiple rounds of DuoNebs but continued to experience shortness of breath and wheezy.  Chest x-ray showed no acute findings.  New events last 24 hours / Subjective: Patient states that she is feeling better.  Remains on 2 L oxygen.  Continues to have cough  Assessment & Plan:   Principal Problem:   COPD exacerbation (HCC) Active Problems:   Hypothyroidism   Dyslipidemia   Acute on chronic respiratory failure with hypoxia (HCC)   Chronic diastolic heart failure (HCC)   GERD without esophagitis   CAD (coronary artery disease) of bypass graft   Prolonged QT interval   Depression   Memory loss   Paroxysmal atrial fibrillation (HCC)   COPD (chronic obstructive pulmonary disease) (HCC)   COPD exacerbation with acute on chronic hypoxic respiratory failure -COVID-negative -Chest  x-ray reviewed independently, without overt opacity or edema -Continue nasal cannula O2 as needed to maintain sats 88 to 92%.  Currently requiring 2 L nasal cannula O2 -Continue Solu-Medrol, Doxycycline, breathing treatments  Chronic diastolic heart failure -Without evidence of acute exacerbation  -Resume home Lasix  Hypertension -Amlodipine, Lopressor, Aldactone, Lasix  Paroxysmal A-fib -Not on anticoagulation due to history of severe GI bleed -Lopressor  CKD stage IIIb -Baseline creatinine 1.3 -Stable   Hypothyroidism -Synthroid  CAD Demand ischemia -Troponin 23, 27 -Aspirin, Lipitor  Type 2 diabetes -SSI  Prolonged Qtc -Repeat EKG reviewed independently, normal sinus rhythm with PVC, left axis deviation, QTc 510   DVT prophylaxis:  heparin injection 5,000 Units Start: 05/09/23 2200  Code Status: DNR Family Communication: None at bedside Disposition Plan: Home Status is: Inpatient Remains inpatient appropriate because: IV steroid       Antimicrobials:  Anti-infectives (From admission, onward)    Start     Dose/Rate Route Frequency Ordered Stop   05/09/23 2200  doxycycline (VIBRA-TABS) tablet 100 mg        100 mg Oral Every 12 hours 05/09/23 1602 05/14/23 2159        Objective: Vitals:   05/10/23 1942 05/11/23 0400 05/11/23 0741 05/11/23 0926  BP:  135/71 138/72   Pulse:  64 61   Resp:  16 18   Temp:   98.3 F (36.8 C)   TempSrc:   Oral   SpO2: 93% 92% 91% 93%    Intake/Output Summary (Last 24 hours) at 05/11/2023  1053 Last data filed at 05/11/2023 0930 Gross per 24 hour  Intake 238 ml  Output 0 ml  Net 238 ml    There were no vitals filed for this visit.  Examination:  General exam: Appears calm and comfortable  Respiratory system: Expiratory wheezes bilaterally, no conversational dyspnea, on 2L O2  Cardiovascular system: S1 & S2 heard, RRR. No murmurs. No pedal edema. Gastrointestinal system: Abdomen is nondistended, soft and  nontender. Normal bowel sounds heard. Central nervous system: Alert and oriented. No focal neurological deficits. Speech clear.  Extremities: Symmetric in appearance  Skin: No rashes, lesions or ulcers on exposed skin  Psychiatry: Judgement and insight appear normal. Mood & affect appropriate.   Data Reviewed: I have personally reviewed following labs and imaging studies  CBC: Recent Labs  Lab 05/09/23 1328  WBC 9.8  NEUTROABS 7.3  HGB 13.7  HCT 44.6  MCV 92.7  PLT 197    Basic Metabolic Panel: Recent Labs  Lab 05/09/23 1328 05/10/23 1025  NA 136 138  K 4.2 4.5  CL 96* 98  CO2 25 30  GLUCOSE 102* 200*  BUN 20 28*  CREATININE 1.39* 1.35*  CALCIUM 8.7* 8.6*    GFR: Estimated Creatinine Clearance: 31.7 mL/min (A) (by C-G formula based on SCr of 1.35 mg/dL (H)). Liver Function Tests: Recent Labs  Lab 05/09/23 1328  AST 18  ALT 11  ALKPHOS 69  BILITOT 0.4  PROT 6.9  ALBUMIN 3.0*    No results for input(s): "LIPASE", "AMYLASE" in the last 168 hours. No results for input(s): "AMMONIA" in the last 168 hours. Coagulation Profile: No results for input(s): "INR", "PROTIME" in the last 168 hours. Cardiac Enzymes: No results for input(s): "CKTOTAL", "CKMB", "CKMBINDEX", "TROPONINI" in the last 168 hours. BNP (last 3 results) Recent Labs    03/02/23 1146  PROBNP 1,203*    HbA1C: No results for input(s): "HGBA1C" in the last 72 hours. CBG: No results for input(s): "GLUCAP" in the last 168 hours. Lipid Profile: No results for input(s): "CHOL", "HDL", "LDLCALC", "TRIG", "CHOLHDL", "LDLDIRECT" in the last 72 hours. Thyroid Function Tests: No results for input(s): "TSH", "T4TOTAL", "FREET4", "T3FREE", "THYROIDAB" in the last 72 hours. Anemia Panel: No results for input(s): "VITAMINB12", "FOLATE", "FERRITIN", "TIBC", "IRON", "RETICCTPCT" in the last 72 hours. Sepsis Labs: No results for input(s): "PROCALCITON", "LATICACIDVEN" in the last 168 hours.  Recent  Results (from the past 240 hour(s))  SARS Coronavirus 2 by RT PCR (hospital order, performed in [facility] hospital lab) *cepheid single result test* Anterior Nasal Swab     Status: None   Collection Time: 05/09/23  1:11 PM   Specimen: Anterior Nasal Swab  Result Value Ref Range Status   SARS Coronavirus 2 by RT PCR NEGATIVE NEGATIVE Final    Comment: Performed at [facility], [address] [place], [place] [address]      Radiology Studies: DG Chest 1 View  Result Date: 05/10/2023 CLINICAL DATA:  CHF, shortness of breath EXAM: CHEST  1 VIEW COMPARISON:  05/09/2023 FINDINGS: Prior CABG. Cardiomegaly, aortic atherosclerosis. Biapical scarring. No acute confluent opacities, effusions or edema. No acute bony abnormality IMPRESSION: Cardiomegaly.  Chronic changes within the lungs.  No active disease. Electronically Signed   By: Charlett Nose M.D.   On: 05/10/2023 07:34   DG Chest Portable 1 View  Result Date: 05/09/2023 CLINICAL DATA:  Cough, COPD EXAM: PORTABLE CHEST 1 VIEW COMPARISON:  06/05/2022 FINDINGS: Single frontal view of the chest demonstrates a stable  cardiac silhouette. Stable ectasia and atherosclerosis of the thoracic aorta. Background pleural and parenchymal scarring again noted consistent with history of emphysema. No acute airspace disease, effusion, or pneumothorax. The calcified bilateral breast prostheses. Stable right shoulder arthroplasty. IMPRESSION: 1. Neck on scarring and emphysema.  No acute airspace disease. Electronically Signed   By: Sharlet Salina M.D.   On: 05/09/2023 14:22      Scheduled Meds:  allopurinol  100 mg Oral Q breakfast   amLODipine  2.5 mg Oral Daily   ARIPiprazole  2 mg Oral Daily   aspirin EC  81 mg Oral Q breakfast   atorvastatin  20 mg Oral Daily   citalopram  10 mg Oral Daily   donepezil  5 mg Oral QHS   doxycycline  100 mg Oral Q12H   furosemide  80 mg Oral QODAY   gabapentin  100 mg Oral BID   heparin  5,000 Units Subcutaneous  Q12H   levothyroxine  50 mcg Oral QAC breakfast   melatonin  10 mg Oral QHS   methylPREDNISolone (SOLU-MEDROL) injection  40 mg Intravenous Q12H   metoprolol tartrate  12.5 mg Oral BID   mometasone-formoterol  2 puff Inhalation BID   pantoprazole  40 mg Oral Daily   pilocarpine  1 drop Both Eyes BID   pramipexole  0.25 mg Oral QHS   roflumilast  500 mcg Oral Daily   spironolactone  25 mg Oral Daily   traZODone  150 mg Oral QHS   Continuous Infusions:   LOS: 1 day   Time spent: 25 minutes   Noralee Stain, DO [facility] 05/11/2023, 10:53 AM   Available via Epic secure chat 7am-7pm After these hours, please refer to coverage provider listed on [email or web address]

## 2023-05-11 NOTE — TOC Progression Note (Addendum)
Transition of Care [facility]) - Progression Note    Patient Details  Name: [name] MRN: [id number] Date of Birth: Nov 26, 1941  [facility] CM/SW Contact  Gordy Clement, RN Phone Number: 05/11/2023, 2:50 PM  Clinical Narrative:     Referral made to [facility] for PT and OT Patient choice. Patient states when she dc's to home, her twin Granddaughters live right behind her and will be there when she gets home.A Family member will transport at dc and bring her portable O2   TOC will continue to follow patient for any additional discharge needs     Expected Discharge Plan: Home w Home Health Services (Dispo TBD but poissibly Home w/HH) Barriers to Discharge: Continued Medical Work up  Expected Discharge Plan and Services       Living arrangements for the past 2 months: Single Family Home                 DME Arranged:  (TBD)           HH Agency:  (Patient has had [facility] in the past)         Social Determinants of Health (SDOH) Interventions SDOH Screenings   Food Insecurity: No Food Insecurity (01/10/2021)  Housing: Low Risk  (11/11/2021)  Transportation Needs: No Transportation Needs (04/22/2022)  Alcohol Screen: Low Risk  (11/11/2021)  Depression (PHQ2-9): Low Risk  (04/22/2022)  Physical Activity: Inactive (04/22/2022)  Tobacco Use: Medium Risk (05/09/2023)    Readmission Risk Interventions    05/13/2021    1:12 PM  Readmission Risk Prevention Plan  Transportation Screening Complete  Medication Review [employer]) Complete  HRI or Home Care Consult Complete  SW Recovery Care/Counseling Consult Complete  Palliative Care Screening Not Applicable  Skilled Nursing Facility Not Applicable

## 2023-05-11 NOTE — Evaluation (Signed)
Physical Therapy Evaluation Patient Details Name: [name] MRN: [id number] DOB: 1941-12-03 Today's Date: 05/11/2023  History of Present Illness  82 y.o. female presents to [facility] hospital on 05/09/2023 with cough, wheezing, SOB. Pt admitted for COPD exacerbation. PMH includes COPD, chronic respiratory failure on 3L [place], PAF, CABG, chronic HFrEF, HTN, CKDIII, DMII.  Clinical Impression  Pt presents to PT with deficits in activity tolerance, cardiopulmonary function, gait, balance, power. Pt reports DOE with ambulation despite stable sats on 3L [place]. Pt has been mobilizing primarily with a rollator at home, reporting more difficulty ambulating recently as she was utilizing a SPC prior to the last few months. Pt will benefit from frequent mobilization in an effort to improve activity tolerance and LE strength. Pt will benefit from further education on energy conservation. PT recommends discharge home when medically appropriate.       Recommendations for follow up therapy are one component of a multi-disciplinary discharge planning process, led by the attending physician.  Recommendations may be updated based on patient status, additional functional criteria and insurance authorization.  Follow Up Recommendations       Assistance Recommended at Discharge Intermittent Supervision/Assistance  Patient can return home with the following  A little help with bathing/dressing/bathroom;Assistance with cooking/housework;Assist for transportation;Help with stairs or ramp for entrance    Equipment Recommendations None recommended by PT  Recommendations for Other Services       Functional Status Assessment Patient has had a recent decline in their functional status and demonstrates the ability to make significant improvements in function in a reasonable and predictable amount of time.     Precautions / Restrictions Precautions Precautions: Fall Restrictions Weight Bearing Restrictions: No       Mobility  Bed Mobility               General bed mobility comments: received and left sitting at edge of bed, not assessed    Transfers Overall transfer level: Needs assistance Equipment used: Rolling walker (2 wheels) Transfers: Sit to/from Stand Sit to Stand: Supervision                Ambulation/Gait Ambulation/Gait assistance: Min guard Gait Distance (Feet): 80 Feet Assistive device: Rolling walker (2 wheels) Gait Pattern/deviations: Step-through pattern Gait velocity: reduced Gait velocity interpretation: <1.8 ft/sec, indicate of risk for recurrent falls   General Gait Details: slowed step-through gait with flexed trunk  Stairs            Wheelchair Mobility    Modified Rankin (Stroke Patients Only)       Balance Overall balance assessment: Needs assistance Sitting-balance support: No upper extremity supported, Feet supported Sitting balance-Leahy Scale: Good     Standing balance support: Single extremity supported, Reliant on assistive device for balance Standing balance-Leahy Scale: Poor                               Pertinent Vitals/Pain Pain Assessment Pain Assessment: No/denies pain    Home Living Family/patient expects to be discharged to:: Private residence Living Arrangements: Alone Available Help at Discharge: Family;Available 24 hours/day;Other (Comment) Type of Home: House Home Access: Stairs to enter Entrance Stairs-Rails: Right;Left;Can reach both Entrance Stairs-Number of Steps: 2   Home Layout: One level Home Equipment: Cane - single point;Rollator (4 wheels);Shower seat;BSC/3in1      Prior Function Prior Level of Function : Independent/Modified Independent  Mobility Comments: ambulates with rollator, reports fatigue with household chores       Hand Dominance   Dominant Hand: Right    Extremity/Trunk Assessment   Upper Extremity Assessment Upper Extremity Assessment:  Generalized weakness    Lower Extremity Assessment Lower Extremity Assessment: Generalized weakness    Cervical / Trunk Assessment Cervical / Trunk Assessment: Kyphotic  Communication   Communication: No difficulties  Cognition Arousal/Alertness: Awake/alert Behavior During Therapy: WFL for tasks assessed/performed Overall Cognitive Status: Within Functional Limits for tasks assessed                                          General Comments General comments (skin integrity, edema, etc.): pt on 2L [place] upon PT arrival, pt reports wearing 3L [place] most of the time at home. Pt sats in mid 90s on 3L [place] when ambulating, does report significant DOE, with some coughing/wheezing at completion of ambulation. Pt recovers quickly from this.    Exercises     Assessment/Plan    PT Assessment Patient needs continued PT services  PT Problem List Decreased strength;Decreased activity tolerance;Decreased balance;Decreased mobility;Cardiopulmonary status limiting activity       PT Treatment Interventions DME instruction;Gait training;Stair training;Functional mobility training;Therapeutic activities;Balance training;Patient/family education    PT Goals (Current goals can be found in the Care Plan section)  Acute Rehab PT Goals Patient Stated Goal: to improve activity tolerance PT Goal Formulation: With patient Time For Goal Achievement: 05/25/23 Potential to Achieve Goals: Good Additional Goals Additional Goal #1: Pt will report 1/4 DOE or less when ambulating on 3L [place] for >150' to demonstrate improved activity tolerance    Frequency Min 3X/week     Co-evaluation               AM-PAC PT "6 Clicks" Mobility  Outcome Measure Help needed turning from your back to your side while in a flat bed without using bedrails?: A Little Help needed moving from lying on your back to sitting on the side of a flat bed without using bedrails?: A Little Help needed moving to and from  a bed to a chair (including a wheelchair)?: A Little Help needed standing up from a chair using your arms (e.g., wheelchair or bedside chair)?: A Little Help needed to walk in hospital room?: A Little Help needed climbing 3-5 steps with a railing? : A Little 6 Click Score: 18    End of Session Equipment Utilized During Treatment: Oxygen Activity Tolerance: Patient limited by fatigue Patient left: in bed;with call bell/phone within reach Nurse Communication: Mobility status PT Visit Diagnosis: Other abnormalities of gait and mobility (R26.89);Muscle weakness (generalized) (M62.81)    Time: 0960-4540 PT Time Calculation (min) (ACUTE ONLY): 16 min   Charges:   PT Evaluation $PT Eval Low Complexity: 1 Low          Arlyss Gandy, PT, DPT Acute Rehabilitation Office [phone]   Arlyss Gandy 05/11/2023, 1:20 PM

## 2023-05-11 NOTE — TOC Initial Note (Signed)
Transition of Care [facility]) - Initial/Assessment Note    Patient Details  Name: [name] MRN: [id number] Date of Birth: 12-07-1941  Transition [facility]) CM/SW Contact:    Gordy Clement, RN Phone Number: 05/11/2023, 11:37 AM  Clinical Narrative:         Patient came from home with COPD exacerbation and possible CHF. Steroids initiated.  Covid negative. Home O2 is 4L as needed provided by [facility] Patient  She has been on 2L continuous while here. Continuing meds and breathing treatments. Patient has had [facility] in the past  TOC will continue to follow patient for any additional discharge needs             Expected Discharge Plan: Home w Home Health Services (Dispo TBD but poissibly Home w/HH) Barriers to Discharge: Continued Medical Work up   Patient Goals and CMS Choice            Expected Discharge Plan and Services       Living arrangements for the past 2 months: Single Family Home                 DME Arranged:  (TBD)           HH Agency:  (Patient has had [facility] in the past)        Prior Living Arrangements/Services Living arrangements for the past 2 months: Single Family Home Lives with:: Self                   Activities of Daily Living Home Assistive Devices/Equipment: Cane (specify quad or straight) ADL Screening (condition at time of admission) Patient's cognitive ability adequate to safely complete daily activities?: Yes Is the patient deaf or have difficulty hearing?: No Does the patient have difficulty seeing, even when wearing glasses/contacts?: No Does the patient have difficulty concentrating, remembering, or making decisions?: No Patient able to express need for assistance with ADLs?: Yes Does the patient have difficulty dressing or bathing?: No Independently performs ADLs?: Yes (appropriate for developmental age) Does the patient have difficulty walking or climbing stairs?: Yes Weakness of Legs: Both Weakness of  Arms/Hands: None  Permission Sought/Granted                  Emotional Assessment              Admission diagnosis:  COPD (chronic obstructive pulmonary disease) (HCC) [J44.9] COPD exacerbation (HCC) [J44.1] Patient Active Problem List   Diagnosis Date Noted   COPD (chronic obstructive pulmonary disease) (HCC) 05/10/2023   Chronic pain of right knee 04/30/2023   Paroxysmal atrial fibrillation (HCC) 04/30/2023   PSVT (paroxysmal supraventricular tachycardia) 01/26/2023   Memory loss 01/09/2023   SVT (supraventricular tachycardia) 01/09/2023   Trigger finger of right hand 01/09/2023   Anemia 01/09/2023   Chronic pain 05/11/2021   Chest pain 05/10/2021   COPD exacerbation (HCC) 04/29/2021   Prolonged QT interval 04/29/2021   Depression 04/29/2021   RLS (restless legs syndrome) 04/29/2021   CAD (coronary artery disease) of bypass graft 02/07/2021   CHF (congestive heart failure) (HCC) 02/06/2021   CHF (congestive heart failure), NYHA class III, acute, diastolic (HCC) 02/04/2021   Normocytic anemia 10/28/2020   Acute respiratory failure with hypoxia (HCC) 10/09/2020   Hypotension 10/09/2020   CAP (community acquired pneumonia) 10/09/2020   L Subclavian Artery Stenosis    S/P hip replacement, left 02/07/2020   History of pulmonary embolism 10/04/2019   Dyspnea 12/24/2018  Cellulitis 12/24/2018   Otitis media 12/24/2018   Diuretic-induced hypokalemia 10/08/2018   Acute on chronic diastolic heart failure (HCC) 10/06/2018   Hematuria 04/01/2018   Acute lower UTI 04/01/2018   Hematoma of abdominal wall, subsequent encounter 04/01/2018   Chronic diastolic heart failure (HCC) 03/29/2018   GERD without esophagitis 03/29/2018   Chronic gout 03/29/2018   Chronic bilateral low back pain without sciatica 03/29/2018   Colitis 03/10/2018   AKI (acute kidney injury) (HCC)    Ascites    Pulmonary embolism (HCC) 03/01/2018   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HCC) 03/01/2018    Acute on chronic respiratory failure with hypoxia (HCC) 03/01/2018   Angiodysplasia of intestine    Hematochezia    Malnutrition of moderate degree 02/15/2017   Blood loss anemia 02/13/2017   Impingement syndrome of left shoulder 02/10/2013   PAD (peripheral artery disease) (HCC) 11/17/2011   Dyslipidemia 10/17/2011   Coronary artery disease involving native coronary artery of native heart with angina pectoris (HCC)    Hypothyroidism 05/03/2008   Essential hypertension 05/03/2008   Sleep apnea 05/03/2008   PCP:  Crist Fat, MD Pharmacy:   [facility] [place], [place] - [address] [place] [address] Phone: [phone] Fax: [phone]  [facility] [place], [place] - [address] [place] [address] Phone: [phone] Fax: [phone]     Social Determinants of Health (SDOH) Social History: SDOH Screenings   Food Insecurity: No Food Insecurity (01/10/2021)  Housing: Low Risk  (11/11/2021)  Transportation Needs: No Transportation Needs (04/22/2022)  Alcohol Screen: Low Risk  (11/11/2021)  Depression (PHQ2-9): Low Risk  (04/22/2022)  Physical Activity: Inactive (04/22/2022)  Tobacco Use: Medium Risk (05/09/2023)   SDOH Interventions:     Readmission Risk Interventions    05/13/2021    1:12 PM  Readmission Risk Prevention Plan  Transportation Screening Complete  Medication Review (RN Care Manager) Complete  HRI or Home Care Consult Complete  SW Recovery Care/Counseling Consult Complete  Palliative Care Screening Not Applicable  Skilled Nursing Facility Not Applicable

## 2023-05-12 DIAGNOSIS — J441 Chronic obstructive pulmonary disease with (acute) exacerbation: Secondary | ICD-10-CM | POA: Diagnosis not present

## 2023-05-12 LAB — GLUCOSE, CAPILLARY
Glucose-Capillary: 157 mg/dL — ABNORMAL HIGH (ref 70–99)
Glucose-Capillary: 119 mg/dL — ABNORMAL HIGH (ref 70–99)
Glucose-Capillary: 146 mg/dL — ABNORMAL HIGH (ref 70–99)
Glucose-Capillary: 131 mg/dL — ABNORMAL HIGH (ref 70–99)

## 2023-05-12 NOTE — Progress Notes (Signed)
PROGRESS NOTE    [name]  ZOX:[id number] DOB: [birth year] DOA: 05/09/2023 PCP: Crist Fat, MD     Brief Narrative:  [name] is a 82 y.o. female with medical history significant of COPD Gold stage III with chronic restrictive lung disease, chronic hypoxic respite failure on 4 L as needed for activity, PAF, CAD status post CABG, chronic HFrEF, HTN, CKD stage IIIa, IIDM, presented with worsening of cough wheezing shortness of breath.   Symptoms started 3 days ago, with new onset of cough initially was dry then turned productive since yesterday with light yellowish sputum, along with wheezing and increasing exertional dyspnea.  Started to use home oxygen 4 L continuously with no significant improvement.  Started to have continuous shortness of breath overnight unable to sleep.  Denies any chest pain, no fever or chills.  Denies any peripheral edema.  No recent medication changes.  Today, patient went to urgent care was found to be in COPD exacerbation was given 1 dose of steroid and sent to ED.   Patient was found to be hypoxic in the ED with saturation 87% 2 L, and patient was given multiple rounds of DuoNebs but continued to experience shortness of breath and wheezy.  Chest x-ray showed no acute findings.  New events last 24 hours / Subjective: She was up coughing all night, not feeling well this morning  Assessment & Plan:   Principal Problem:   COPD exacerbation (HCC) Active Problems:   Hypothyroidism   Dyslipidemia   Acute on chronic respiratory failure with hypoxia (HCC)   Chronic diastolic heart failure (HCC)   GERD without esophagitis   CAD (coronary artery disease) of bypass graft   Prolonged QT interval   Depression   Memory loss   Paroxysmal atrial fibrillation (HCC)   COPD (chronic obstructive pulmonary disease) (HCC)   COPD exacerbation with acute on chronic hypoxic respiratory failure -COVID-negative -Chest x-ray reviewed independently,  without overt opacity or edema -Continue nasal cannula O2 as needed to maintain sats 88 to 92%.  Currently requiring 2 L nasal cannula O2 -Continue Solu-Medrol, Doxycycline, breathing treatments  Chronic diastolic heart failure -Without evidence of acute exacerbation  -Resume home Lasix  Hypertension -Amlodipine, Lopressor, Aldactone, Lasix  Paroxysmal A-fib -Not on anticoagulation due to history of severe GI bleed -Lopressor  CKD stage IIIb -Baseline creatinine 1.3 -Stable   Hypothyroidism -Synthroid  CAD Demand ischemia -Troponin 23, 27 -Aspirin, Lipitor  Type 2 diabetes -SSI  Prolonged Qtc -Repeat EKG reviewed independently, normal sinus rhythm with PVC, left axis deviation, QTc 510   DVT prophylaxis:  heparin injection 5,000 Units Start: 05/09/23 2200  Code Status: DNR Family Communication: None at bedside Disposition Plan: Home Status is: Inpatient Remains inpatient appropriate because: IV steroid       Antimicrobials:  Anti-infectives (From admission, onward)    Start     Dose/Rate Route Frequency Ordered Stop   05/09/23 2200  doxycycline (VIBRA-TABS) tablet 100 mg        100 mg Oral Every 12 hours 05/09/23 1602 05/14/23 2159        Objective: Vitals:   05/11/23 1416 05/11/23 1941 05/11/23 2222 05/12/23 0754  BP: (!) 106/52  (!) 137/57 (!) 142/64  Pulse: 66  73 71  Resp:   18   Temp: (!) 97.4 F (36.3 C)  97.9 F (36.6 C) (!) 97.5 F (36.4 C)  TempSrc: Oral   Oral  SpO2: 91% 96% 94% 94%    Intake/Output Summary (Last  24 hours) at 05/12/2023 1144 Last data filed at 05/12/2023 0900 Gross per 24 hour  Intake 238 ml  Output 0 ml  Net 238 ml    There were no vitals filed for this visit.  Examination:  General exam: Appears calm and comfortable  Respiratory system: Expiratory wheezes bilaterally, no conversational dyspnea, on 2L O2  Cardiovascular system: S1 & S2 heard, RRR. No murmurs. No pedal edema. Gastrointestinal system:  Abdomen is nondistended, soft and nontender. Normal bowel sounds heard. Central nervous system: Alert and oriented. No focal neurological deficits. Speech clear.  Extremities: Symmetric in appearance  Skin: No rashes, lesions or ulcers on exposed skin  Psychiatry: Judgement and insight appear normal. Mood & affect appropriate.   Data Reviewed: I have personally reviewed following labs and imaging studies  CBC: Recent Labs  Lab 05/09/23 1328  WBC 9.8  NEUTROABS 7.3  HGB 13.7  HCT 44.6  MCV 92.7  PLT 197    Basic Metabolic Panel: Recent Labs  Lab 05/09/23 1328 05/10/23 1025  NA 136 138  K 4.2 4.5  CL 96* 98  CO2 25 30  GLUCOSE 102* 200*  BUN 20 28*  CREATININE 1.39* 1.35*  CALCIUM 8.7* 8.6*    GFR: Estimated Creatinine Clearance: 31.7 mL/min (A) (by C-G formula based on SCr of 1.35 mg/dL (H)). Liver Function Tests: Recent Labs  Lab 05/09/23 1328  AST 18  ALT 11  ALKPHOS 69  BILITOT 0.4  PROT 6.9  ALBUMIN 3.0*    No results for input(s): "LIPASE", "AMYLASE" in the last 168 hours. No results for input(s): "AMMONIA" in the last 168 hours. Coagulation Profile: No results for input(s): "INR", "PROTIME" in the last 168 hours. Cardiac Enzymes: No results for input(s): "CKTOTAL", "CKMB", "CKMBINDEX", "TROPONINI" in the last 168 hours. BNP (last 3 results) Recent Labs    03/02/23 1146  PROBNP 1,203*    HbA1C: No results for input(s): "HGBA1C" in the last 72 hours. CBG: Recent Labs  Lab 05/11/23 1236 05/11/23 1733 05/11/23 2033 05/12/23 0756  GLUCAP 150* 151* 127* 157*   Lipid Profile: No results for input(s): "CHOL", "HDL", "LDLCALC", "TRIG", "CHOLHDL", "LDLDIRECT" in the last 72 hours. Thyroid Function Tests: No results for input(s): "TSH", "T4TOTAL", "FREET4", "T3FREE", "THYROIDAB" in the last 72 hours. Anemia Panel: No results for input(s): "VITAMINB12", "FOLATE", "FERRITIN", "TIBC", "IRON", "RETICCTPCT" in the last 72 hours. Sepsis Labs: No  results for input(s): "PROCALCITON", "LATICACIDVEN" in the last 168 hours.  Recent Results (from the past 240 hour(s))  SARS Coronavirus 2 by RT PCR (hospital order, performed in [facility] hospital lab) *cepheid single result test* Anterior Nasal Swab     Status: None   Collection Time: 05/09/23  1:11 PM   Specimen: Anterior Nasal Swab  Result Value Ref Range Status   SARS Coronavirus 2 by RT PCR NEGATIVE NEGATIVE Final    Comment: Performed at [facility], [address] [place], [place] [address]      Radiology Studies: No results found.    Scheduled Meds:  allopurinol  100 mg Oral Q breakfast   amLODipine  2.5 mg Oral Daily   ARIPiprazole  2 mg Oral Daily   aspirin EC  81 mg Oral Q breakfast   atorvastatin  20 mg Oral Daily   citalopram  10 mg Oral Daily   donepezil  5 mg Oral QHS   doxycycline  100 mg Oral Q12H   furosemide  80 mg Oral QODAY   gabapentin  100 mg  Oral BID   heparin  5,000 Units Subcutaneous Q12H   insulin aspart  0-9 Units Subcutaneous TID WC   levothyroxine  50 mcg Oral QAC breakfast   melatonin  10 mg Oral QHS   methylPREDNISolone (SOLU-MEDROL) injection  40 mg Intravenous Q12H   metoprolol tartrate  12.5 mg Oral BID   mometasone-formoterol  2 puff Inhalation BID   pantoprazole  40 mg Oral Daily   pilocarpine  1 drop Both Eyes BID   pramipexole  0.25 mg Oral QHS   roflumilast  500 mcg Oral Daily   spironolactone  25 mg Oral Daily   traZODone  150 mg Oral QHS   Continuous Infusions:   LOS: 2 days   Time spent: 25 minutes   Noralee Stain, DO [facility] 05/12/2023, 11:44 AM   Available via Epic secure chat 7am-7pm After these hours, please refer to coverage provider listed on [email or web address]

## 2023-05-12 NOTE — TOC Progression Note (Signed)
Transition of Care [facility]) - Progression Note    Patient Details  Name: [name] MRN: [id number] Date of Birth: [birth year]  Transition of Care [facility]) CM/SW Contact  Lawerance Sabal, RN Phone Number: 05/12/2023, 3:38 PM  Clinical Narrative:     Obtained updated ambulatory sats, patient qualifies for continuous home oxygen.  Spoke w [facility] patient [phone], current [facility] home oxygen provider.  They will access EPIC and pull orders and update her account and make sure she has appropriate oxygen support at home (extra tanks).    Expected Discharge Plan: Home w Home Health Services (Dispo TBD but poissibly Home w/HH) Barriers to Discharge: Continued Medical Work up  Expected Discharge Plan and Services       Living arrangements for the past 2 months: Single Family Home                 DME Arranged: Oxygen   Date DME Agency Contacted: 05/12/23 Time DME Agency Contacted: 1536 Representative spoke with at DME Agency: [facility] patient   HH Agency:  (Patient has had [facility] in the past)         Social Determinants of Health (SDOH) Interventions SDOH Screenings   Food Insecurity: No Food Insecurity (05/12/2023)  Housing: Patient Declined (05/12/2023)  Transportation Needs: No Transportation Needs (05/12/2023)  Utilities: Not At Risk (05/12/2023)  Alcohol Screen: Low Risk  (11/11/2021)  Depression (PHQ2-9): Low Risk  (04/22/2022)  Physical Activity: Inactive (04/22/2022)  Tobacco Use: Medium Risk (05/09/2023)    Readmission Risk Interventions    05/13/2021    1:12 PM  Readmission Risk Prevention Plan  Transportation Screening Complete  Medication Review Oceanographer) Complete  HRI or Home Care Consult Complete  SW Recovery Care/Counseling Consult Complete  Palliative Care Screening Not Applicable  Skilled Nursing Facility Not Applicable

## 2023-05-12 NOTE — Progress Notes (Signed)
Mobility Specialist Progress Note   05/12/23 1120  Mobility  Activity Ambulated with assistance in hallway;Ambulated with assistance in room;Dangled on edge of bed  Level of Assistance Contact guard assist, steadying assist  Assistive Device Cane  Distance Ambulated (ft) 20 ft  Range of Motion/Exercises Active;All extremities  Activity Response Tolerated fair   Pre Ambulation:  HR 70, SpO2 89-90% RA During Ambulation: HR 73, SpO2 82% RA; 91% 3LO2 Post Ambulation: HR 71, SpO2 91% 3LO2  Patient received dangling EOB and agreeable to participate. Stood and ambulated min guard with slow gait. Distance limited secondary significant dyspnea, fatigue and lightheadedness. Complained of lightheadedness pre, during and post ambulation. Oxygen desaturated to 82% on RA requiring 3LO2 to maintain >91%. Returned to room without incident. Was left dangling EOB with all needs met, call bell in reach.   [name], BS EXP Mobility Specialist Please contact via SecureChat or Rehab office at [phone]

## 2023-05-12 NOTE — Progress Notes (Signed)
SATURATION QUALIFICATIONS: (This note is used to comply with regulatory documentation for home oxygen)  Patient Saturations on Room Air at Rest = 89%  Patient Saturations on Room Air while Ambulating = 82%  Patient Saturations on 3 Liters of oxygen while Ambulating = 91%  Please briefly explain why patient needs home oxygen:

## 2023-05-12 NOTE — Plan of Care (Signed)
  Problem: Education: Goal: Knowledge of disease or condition will improve Outcome: Progressing Goal: Knowledge of the prescribed therapeutic regimen will improve Outcome: Progressing Goal: Individualized Educational Video(s) Outcome: Progressing   Problem: Activity: Goal: Ability to tolerate increased activity will improve Outcome: Progressing Goal: Will verbalize the importance of balancing activity with adequate rest periods Outcome: Progressing   Problem: Respiratory: Goal: Ability to maintain a clear airway will improve Outcome: Progressing Goal: Levels of oxygenation will improve Outcome: Progressing Goal: Ability to maintain adequate ventilation will improve Outcome: Progressing   Problem: Education: Goal: Knowledge of General Education information will improve Description: Including pain rating scale, medication(s)/side effects and non-pharmacologic comfort measures Outcome: Progressing   Problem: Health Behavior/Discharge Planning: Goal: Ability to manage health-related needs will improve Outcome: Progressing   Problem: Clinical Measurements: Goal: Ability to maintain clinical measurements within normal limits will improve Outcome: Progressing Goal: Will remain free from infection Outcome: Progressing Goal: Diagnostic test results will improve Outcome: Progressing Goal: Respiratory complications will improve Outcome: Progressing Goal: Cardiovascular complication will be avoided Outcome: Progressing   Problem: Activity: Goal: Risk for activity intolerance will decrease Outcome: Progressing   Problem: Nutrition: Goal: Adequate nutrition will be maintained Outcome: Progressing   Problem: Coping: Goal: Level of anxiety will decrease Outcome: Progressing   Problem: Elimination: Goal: Will not experience complications related to bowel motility Outcome: Progressing Goal: Will not experience complications related to urinary retention Outcome: Progressing    Problem: Pain Managment: Goal: General experience of comfort will improve Outcome: Progressing   Problem: Safety: Goal: Ability to remain free from injury will improve Outcome: Progressing   Problem: Skin Integrity: Goal: Risk for impaired skin integrity will decrease Outcome: Progressing   Problem: Education: Goal: Ability to describe self-care measures that may prevent or decrease complications (Diabetes Survival Skills Education) will improve Outcome: Progressing Goal: Individualized Educational Video(s) Outcome: Progressing   Problem: Coping: Goal: Ability to adjust to condition or change in health will improve Outcome: Progressing   Problem: Fluid Volume: Goal: Ability to maintain a balanced intake and output will improve Outcome: Progressing   Problem: Health Behavior/Discharge Planning: Goal: Ability to identify and utilize available resources and services will improve Outcome: Progressing Goal: Ability to manage health-related needs will improve Outcome: Progressing   Problem: Metabolic: Goal: Ability to maintain appropriate glucose levels will improve Outcome: Progressing   Problem: Nutritional: Goal: Maintenance of adequate nutrition will improve Outcome: Progressing Goal: Progress toward achieving an optimal weight will improve Outcome: Progressing   Problem: Skin Integrity: Goal: Risk for impaired skin integrity will decrease Outcome: Progressing   Problem: Tissue Perfusion: Goal: Adequacy of tissue perfusion will improve Outcome: Progressing   

## 2023-05-13 ENCOUNTER — Inpatient Hospital Stay (HOSPITAL_COMMUNITY): Payer: Medicare HMO

## 2023-05-13 DIAGNOSIS — J441 Chronic obstructive pulmonary disease with (acute) exacerbation: Secondary | ICD-10-CM | POA: Diagnosis not present

## 2023-05-13 LAB — URINALYSIS, ROUTINE W REFLEX MICROSCOPIC
Bacteria, UA: NONE SEEN
Bilirubin Urine: NEGATIVE
Glucose, UA: NEGATIVE mg/dL
Ketones, ur: NEGATIVE mg/dL
Leukocytes,Ua: NEGATIVE
Nitrite: NEGATIVE
Protein, ur: NEGATIVE mg/dL
Specific Gravity, Urine: 1.016 (ref 1.005–1.030)
pH: 5 (ref 5.0–8.0)

## 2023-05-13 LAB — GLUCOSE, CAPILLARY
Glucose-Capillary: 149 mg/dL — ABNORMAL HIGH (ref 70–99)
Glucose-Capillary: 111 mg/dL — ABNORMAL HIGH (ref 70–99)
Glucose-Capillary: 145 mg/dL — ABNORMAL HIGH (ref 70–99)
Glucose-Capillary: 135 mg/dL — ABNORMAL HIGH (ref 70–99)

## 2023-05-13 LAB — BASIC METABOLIC PANEL
Anion gap: 10 (ref 5–15)
BUN: 54 mg/dL — ABNORMAL HIGH (ref 8–23)
CO2: 25 mmol/L (ref 22–32)
Calcium: 9 mg/dL (ref 8.9–10.3)
Chloride: 98 mmol/L (ref 98–111)
Creatinine, Ser: 1.3 mg/dL — ABNORMAL HIGH (ref 0.44–1.00)
GFR, Estimated: 41 mL/min — ABNORMAL LOW (ref >60)
Glucose, Bld: 147 mg/dL — ABNORMAL HIGH (ref 70–99)
Potassium: 4.2 mmol/L (ref 3.5–5.1)
Sodium: 133 mmol/L — ABNORMAL LOW (ref 135–145)

## 2023-05-13 LAB — CBC
HCT: 46.6 % — ABNORMAL HIGH (ref 36.0–46.0)
Hemoglobin: 15.1 g/dL — ABNORMAL HIGH (ref 12.0–15.0)
MCH: 28.2 pg (ref 26.0–34.0)
MCHC: 32.4 g/dL (ref 30.0–36.0)
MCV: 87.1 fL (ref 80.0–100.0)
Platelets: 266 10*3/uL (ref 150–400)
RBC: 5.35 MIL/uL — ABNORMAL HIGH (ref 3.87–5.11)
RDW: 14.3 % (ref 11.5–15.5)
WBC: 9.9 10*3/uL (ref 4.0–10.5)
nRBC: 0 % (ref 0.0–0.2)

## 2023-05-13 MED ORDER — PREDNISONE 10 MG PO TABS
10.0000 mg | ORAL_TABLET | Freq: Every day | ORAL | Status: AC
  Administered 2023-05-14: 10 mg via ORAL
  Filled 2023-05-13: qty 1

## 2023-05-13 NOTE — Progress Notes (Signed)
SATURATION QUALIFICATIONS: (This note is used to comply with regulatory documentation for home oxygen)  Patient Saturations on Room Air at Rest = 88%  Patient Saturations on Room Air while Ambulating = 84%  Patient Saturations on 3 Liters of oxygen while Ambulating = 88%  Please briefly explain why patient needs home oxygen: Pt needs at least 3L of supplemental O2 while ambulating and supplemental O2 at rest.   Raymond Gurney, PT, DPT Acute Rehabilitation Services  Office: [phone]

## 2023-05-13 NOTE — Plan of Care (Signed)
  Problem: Education: Goal: Knowledge of disease or condition will improve Outcome: Progressing Goal: Knowledge of the prescribed therapeutic regimen will improve Outcome: Progressing Goal: Individualized Educational Video(s) Outcome: Progressing   Problem: Activity: Goal: Ability to tolerate increased activity will improve Outcome: Progressing Goal: Will verbalize the importance of balancing activity with adequate rest periods Outcome: Progressing   Problem: Respiratory: Goal: Ability to maintain a clear airway will improve Outcome: Progressing Goal: Levels of oxygenation will improve Outcome: Progressing Goal: Ability to maintain adequate ventilation will improve Outcome: Progressing   Problem: Education: Goal: Knowledge of General Education information will improve Description: Including pain rating scale, medication(s)/side effects and non-pharmacologic comfort measures Outcome: Progressing   Problem: Health Behavior/Discharge Planning: Goal: Ability to manage health-related needs will improve Outcome: Progressing   Problem: Clinical Measurements: Goal: Ability to maintain clinical measurements within normal limits will improve Outcome: Progressing Goal: Will remain free from infection Outcome: Progressing Goal: Diagnostic test results will improve Outcome: Progressing Goal: Respiratory complications will improve Outcome: Progressing Goal: Cardiovascular complication will be avoided Outcome: Progressing   Problem: Activity: Goal: Risk for activity intolerance will decrease Outcome: Progressing   Problem: Nutrition: Goal: Adequate nutrition will be maintained Outcome: Progressing   Problem: Coping: Goal: Level of anxiety will decrease Outcome: Progressing   Problem: Elimination: Goal: Will not experience complications related to bowel motility Outcome: Progressing Goal: Will not experience complications related to urinary retention Outcome: Progressing    Problem: Pain Managment: Goal: General experience of comfort will improve Outcome: Progressing   Problem: Safety: Goal: Ability to remain free from injury will improve Outcome: Progressing   Problem: Skin Integrity: Goal: Risk for impaired skin integrity will decrease Outcome: Progressing   Problem: Education: Goal: Ability to describe self-care measures that may prevent or decrease complications (Diabetes Survival Skills Education) will improve Outcome: Progressing Goal: Individualized Educational Video(s) Outcome: Progressing   Problem: Coping: Goal: Ability to adjust to condition or change in health will improve Outcome: Progressing   Problem: Fluid Volume: Goal: Ability to maintain a balanced intake and output will improve Outcome: Progressing   Problem: Health Behavior/Discharge Planning: Goal: Ability to identify and utilize available resources and services will improve Outcome: Progressing Goal: Ability to manage health-related needs will improve Outcome: Progressing   Problem: Metabolic: Goal: Ability to maintain appropriate glucose levels will improve Outcome: Progressing   Problem: Nutritional: Goal: Maintenance of adequate nutrition will improve Outcome: Progressing Goal: Progress toward achieving an optimal weight will improve Outcome: Progressing   Problem: Skin Integrity: Goal: Risk for impaired skin integrity will decrease Outcome: Progressing   Problem: Tissue Perfusion: Goal: Adequacy of tissue perfusion will improve Outcome: Progressing   

## 2023-05-13 NOTE — Hospital Course (Addendum)
[name] is a 82 y.o. female with a history of COPD Gold stage III with chronic restrictive lung disease, chronic respiratory failure with hypoxia on 4 L/min of supplemental oxygen, paroxysmal atrial fibrillation, CAD status post CABG, chronic heart failure with preserved EF, hypertension, CKD stage IIIa, diabetes mellitus type 2 on insulin.  Patient was in secondary to cough, wheezing, shortness of breath with concern for COPD exacerbation.  Patient started on Solu-Medrol, DuoNebs and ongoing supportive care.

## 2023-05-13 NOTE — Progress Notes (Signed)
PROGRESS NOTE    [name]  ZOX:[id number] DOB: 1940-12-30 DOA: 05/09/2023 PCP: Crist Fat, MD   Brief Narrative: [name] is a 82 y.o. female with a history of COPD Gold stage III with chronic restrictive lung disease, chronic respiratory failure with hypoxia on 4 L/min of supplemental oxygen, paroxysmal atrial fibrillation, CAD status post CABG, chronic heart failure with preserved EF, hypertension, CKD stage IIIa, diabetes mellitus type 2 on insulin.  Patient was in secondary to cough, wheezing, shortness of breath with concern for COPD exacerbation.  Patient started on Solu-Medrol, DuoNebs and ongoing supportive care.   Assessment and Plan:  COPD exacerbation Present on admission.  Patient with associated dyspnea and cough.  Patient started on IV Solu-Medrol in addition to DuoNebs.  PT/OT recommending home health therapy.  Patient still with dyspnea which may be near baseline.  No current wheezing noted on exam, but does have rales.  Repeat chest x-ray without evidence of pulmonary edema. -Transition to prednisone p.o. -Continue doxycycline -Continue Dulera and DuoNebs -Ambulatory pulse ox  Chronic respiratory failure Patient states she uses oxygen chronically and uses 4 L/min of supplemental oxygen via nasal canula. No evidence of acute respiratory failure.  Primary pretension -Continue amlodipine, metoprolol, Aldactone, Lasix  Elevated BUN Unclear etiology.  Possibly related to developing AKI from diuresis.  No evidence of GI bleeding noted, although patient has a history. -Urinalysis -Hold spironolactone and Lasix  GERD -Continue Protonix  Paroxysmal atrial fibrillation Patient is managed on metoprolol as an outpatient.  Patient is not on anticoagulation secondary to history of severe GI bleeding -Continue metoprolol tartrate 12.5 mg twice daily  Chronic heart failure with preserved EF Documentation states history of reduced EF, however all  recent echocardiograms with normal LVEF suggesting least recovered EF.  Currently euvolemic and is on spironolactone and Lasix as an outpatient. -Strict in and out  CKD stage IIIb Currently stable with a baseline creatinine of 1.3.  Hypothyroidism -Continue Synthroid 50 mcg daily  CAD Hyperlipidemia Currently stable.  No chest pain.  Patient with elevated troponin of 23 > 27.  No concern for ACS.  Presumed demand ischemia. -Continue aspirin and Lipitor  Diabetes mellitus type 2 -Continue SSI   DVT prophylaxis: Heparin subcu Code Status:   Code Status: DNR Family Communication: None at bedside Disposition Plan: Discharge home likely in 1-2 days pending stability of renal function   Consultants:  None  Procedures:  None  Antimicrobials: Doxycycline    Subjective: Patient reports continued dyspnea. Patient reports dyspnea is not at baseline and has remained persistent since admission. She continues to have dyspnea on exertion which isn't too far from baseline. She also reports continued cough.  Objective: BP (!) 142/56 (BP Location: Right Arm)   Pulse 65   Temp (!) 97.5 F (36.4 C) (Oral)   Resp 15   SpO2 (!) 86%   Examination:  General exam: Appears calm and comfortable Respiratory system: Rales on auscultation. Respiratory effort normal. Cardiovascular system: S1 & S2 heard, RRR. Gastrointestinal system: Abdomen is nondistended, soft and nontender. No organomegaly or masses felt. Normal bowel sounds heard. Central nervous system: Alert and oriented. No focal neurological deficits. Musculoskeletal: No edema. No calf tenderness Skin: No cyanosis. No rashes Psychiatry: Judgement and insight appear normal. Mood & affect appropriate.    Data Reviewed: I have personally reviewed following labs and imaging studies  CBC Lab Results  Component Value Date   WBC 9.9 05/13/2023   RBC 5.35 (H) 05/13/2023  HGB 15.1 (H) 05/13/2023   HCT 46.6 (H) 05/13/2023   MCV  87.1 05/13/2023   MCH 28.2 05/13/2023   PLT 266 05/13/2023   MCHC 32.4 05/13/2023   RDW 14.3 05/13/2023   LYMPHSABS 1.1 05/09/2023   MONOABS 1.3 (H) 05/09/2023   EOSABS 0.0 05/09/2023   BASOSABS 0.0 05/09/2023     Last metabolic panel Lab Results  Component Value Date   NA 133 (L) 05/13/2023   K 4.2 05/13/2023   CL 98 05/13/2023   CO2 25 05/13/2023   BUN 54 (H) 05/13/2023   CREATININE 1.30 (H) 05/13/2023   GLUCOSE 147 (H) 05/13/2023   GFRNONAA 41 (L) 05/13/2023   GFRAA 50 (L) 09/10/2020   CALCIUM 9.0 05/13/2023   PHOS 3.6 10/13/2020   PROT 6.9 05/09/2023   ALBUMIN 3.0 (L) 05/09/2023   LABGLOB 3.8 01/09/2023   AGRATIO 0.9 (L) 01/09/2023   BILITOT 0.4 05/09/2023   ALKPHOS 69 05/09/2023   AST 18 05/09/2023   ALT 11 05/09/2023   ANIONGAP 10 05/13/2023    GFR: Estimated Creatinine Clearance: 32.9 mL/min (A) (by C-G formula based on SCr of 1.3 mg/dL (H)).  Recent Results (from the past 240 hour(s))  SARS Coronavirus 2 by RT PCR (hospital order, performed in [facility] hospital lab) *cepheid single result test* Anterior Nasal Swab     Status: None   Collection Time: 05/09/23  1:11 PM   Specimen: Anterior Nasal Swab  Result Value Ref Range Status   SARS Coronavirus 2 by RT PCR NEGATIVE NEGATIVE Final    Comment: Performed at [facility], [address], [place], [place] [address]      Radiology Studies: No results found.    LOS: 3 days    Jacquelin Hawking, MD [facility] 05/13/2023, 10:03 AM   If 7PM-7AM, please contact night-coverage [email or web address]

## 2023-05-13 NOTE — Progress Notes (Signed)
Physical Therapy Treatment Patient Details Name: [name] MRN: [id number] DOB: 1941-01-09 Today's Date: 05/13/2023   History of Present Illness 82 y.o. female presents to [facility] hospital on 05/09/2023 with cough, wheezing, SOB. Pt admitted for COPD exacerbation. PMH includes COPD, chronic respiratory failure on 3L [place], PAF, CABG, chronic HFrEF, HTN, CKDIII, DMII.    PT Comments    Pt appears to be more fatigued today, only tolerating ambulating up to ~30-40 ft at a time before needing to sit to rest. In addition, pt is needing >/= 3L of supplemental O2 via  to maintain her sats >/= 88% while ambulating. She needs cues for pursed lip breathing and was educated on utilizing her provided IS to improve her pulmonary function. Handout provided on [employer] techniques. Will continue to follow acutely.     Recommendations for follow up therapy are one component of a multi-disciplinary discharge planning process, led by the attending physician.  Recommendations may be updated based on patient status, additional functional criteria and insurance authorization.  Follow Up Recommendations       Assistance Recommended at Discharge Intermittent Supervision/Assistance  Patient can return home with the following A little help with bathing/dressing/bathroom;Assistance with cooking/housework;Assist for transportation;Help with stairs or ramp for entrance;A little help with walking and/or transfers   Equipment Recommendations  None recommended by PT    Recommendations for Other Services       Precautions / Restrictions Precautions Precautions: Fall Precaution Comments: watch SpO2 Restrictions Weight Bearing Restrictions: No     Mobility  Bed Mobility Overal bed mobility: Needs Assistance Bed Mobility: Supine to Sit     Supine to sit: Supervision, HOB elevated     General bed mobility comments: Extra time and pt utilizing bed controls and features to assist with sitting up  L EOB, supervision for safety    Transfers Overall transfer level: Needs assistance Equipment used: Rolling walker (2 wheels) Transfers: Sit to/from Stand Sit to Stand: Min guard           General transfer comment: Extra time to power up to stand from EOB 2x, min guard for safety. Cues provided to push up to stand from bed but pt pulling up on RW.    Ambulation/Gait Ambulation/Gait assistance: Min guard, Min assist Gait Distance (Feet): 40 Feet (x2 bouts of ~40 ft > ~30 ft) Assistive device: Rolling walker (2 wheels) Gait Pattern/deviations: Step-through pattern, Decreased stride length, Shuffle, Trunk flexed Gait velocity: reduced Gait velocity interpretation: <1.31 ft/sec, indicative of household ambulator   General Gait Details: Pt takes slow, small, shuffling steps with a kyphotic posture, needing cues to increase stride length and upright posture. Min guard assist majority of time, x1 moment of minA to turn RW towards bed to get pt to EOB to sit for her safety as she was fatiguing quickly.   Stairs             Wheelchair Mobility    Modified Rankin (Stroke Patients Only)       Balance Overall balance assessment: Needs assistance Sitting-balance support: No upper extremity supported, Feet supported Sitting balance-Leahy Scale: Good     Standing balance support: Reliant on assistive device for balance, Bilateral upper extremity supported Standing balance-Leahy Scale: Poor Standing balance comment: Reliant on RW                            Cognition Arousal/Alertness: Awake/alert Behavior During Therapy: WFL for tasks assessed/performed Overall Cognitive  Status: Within Functional Limits for tasks assessed                                          Exercises      General Comments General comments (skin integrity, edema, etc.): Educated pt on use of IS provided; Educated pt and family on [employer] handout provided;  SPO2 88% on RA at rest, down to 84% on RA when ambulating, >/= 88% on 3L while ambulating      Pertinent Vitals/Pain Pain Assessment Pain Assessment: Faces Faces Pain Scale: Hurts a little bit Pain Location: generalized discomfort per pt Pain Descriptors / Indicators: Discomfort Pain Intervention(s): Limited activity within patient's tolerance, Monitored during session    Home Living                          Prior Function            PT Goals (current goals can now be found in the care plan section) Acute Rehab PT Goals Patient Stated Goal: to improve activity tolerance PT Goal Formulation: With patient/family Time For Goal Achievement: 05/25/23 Potential to Achieve Goals: Good Progress towards PT goals: Progressing toward goals    Frequency    Min 3X/week      PT Plan Current plan remains appropriate    Co-evaluation              AM-PAC PT "6 Clicks" Mobility   Outcome Measure  Help needed turning from your back to your side while in a flat bed without using bedrails?: A Little Help needed moving from lying on your back to sitting on the side of a flat bed without using bedrails?: A Little Help needed moving to and from a bed to a chair (including a wheelchair)?: A Little Help needed standing up from a chair using your arms (e.g., wheelchair or bedside chair)?: A Little Help needed to walk in hospital room?: A Little Help needed climbing 3-5 steps with a railing? : A Little 6 Click Score: 18    End of Session Equipment Utilized During Treatment: Oxygen;Gait belt Activity Tolerance: Patient limited by fatigue Patient left: in bed;with call bell/phone within reach;with bed alarm set;with family/visitor present Nurse Communication: Mobility status;Other (comment) (sats) PT Visit Diagnosis: Other abnormalities of gait and mobility (R26.89);Muscle weakness (generalized) (M62.81);Unsteadiness on feet (R26.81)     Time: 1610-9604 PT Time  Calculation (min) (ACUTE ONLY): 24 min  Charges:  $Gait Training: 8-22 mins $Therapeutic Activity: 8-22 mins                     Raymond Gurney, PT, DPT Acute Rehabilitation Services  Office: [phone]    [name] 05/13/2023, 5:32 PM

## 2023-05-13 NOTE — Care Management Important Message (Signed)
Important Message  Patient Details  Name: Ketti Memory MRN: [id number] Date of Birth: 01-10-1941   Medicare Important Message Given:  Yes     Sherilyn Banker 05/13/2023, 12:06 PM

## 2023-05-14 DIAGNOSIS — J441 Chronic obstructive pulmonary disease with (acute) exacerbation: Secondary | ICD-10-CM | POA: Diagnosis not present

## 2023-05-14 LAB — BASIC METABOLIC PANEL
Anion gap: 11 (ref 5–15)
BUN: 56 mg/dL — ABNORMAL HIGH (ref 8–23)
CO2: 27 mmol/L (ref 22–32)
Calcium: 8.8 mg/dL — ABNORMAL LOW (ref 8.9–10.3)
Chloride: 98 mmol/L (ref 98–111)
Creatinine, Ser: 1.16 mg/dL — ABNORMAL HIGH (ref 0.44–1.00)
GFR, Estimated: 47 mL/min — ABNORMAL LOW (ref >60)
Glucose, Bld: 112 mg/dL — ABNORMAL HIGH (ref 70–99)
Potassium: 4.2 mmol/L (ref 3.5–5.1)
Sodium: 136 mmol/L (ref 135–145)

## 2023-05-14 LAB — GLUCOSE, CAPILLARY
Glucose-Capillary: 157 mg/dL — ABNORMAL HIGH (ref 70–99)
Glucose-Capillary: 115 mg/dL — ABNORMAL HIGH (ref 70–99)
Glucose-Capillary: 156 mg/dL — ABNORMAL HIGH (ref 70–99)
Glucose-Capillary: 147 mg/dL — ABNORMAL HIGH (ref 70–99)

## 2023-05-14 LAB — BUN: BUN: 50 mg/dL — ABNORMAL HIGH (ref 8–23)

## 2023-05-14 MED ORDER — PROCHLORPERAZINE EDISYLATE 10 MG/2ML IJ SOLN: 5.0000 mg | Freq: Four times a day (QID) | INTRAMUSCULAR | Status: DC | PRN

## 2023-05-14 NOTE — Progress Notes (Addendum)
PROGRESS NOTE    [name]  ZOX:[id number] DOB: 1941/10/28 DOA: 05/09/2023 PCP: Crist Fat, MD   Brief Narrative: [name] is a 82 y.o. female with a history of COPD Gold stage III with chronic restrictive lung disease, chronic respiratory failure with hypoxia on 4 L/min of supplemental oxygen, paroxysmal atrial fibrillation, CAD status post CABG, chronic heart failure with preserved EF, hypertension, CKD stage IIIa, diabetes mellitus type 2 on insulin.  Patient was in secondary to cough, wheezing, shortness of breath with concern for COPD exacerbation.  Patient started on Solu-Medrol, DuoNebs and ongoing supportive care.   Assessment and Plan:  COPD exacerbation Present on admission.  Patient with associated dyspnea and cough.  Patient started on IV Solu-Medrol in addition to DuoNebs.  PT/OT recommending home health therapy.  Patient still with dyspnea which may be near baseline.  No current wheezing noted on exam, but does have rales.  Repeat chest x-ray without evidence of pulmonary edema. -Discontinue prednisone -Discontinue doxycycline -Continue Dulera and DuoNebs -Ambulatory pulse ox still pending  Chronic respiratory failure Patient states she uses oxygen chronically and uses 4 L/min of supplemental oxygen via nasal canula. No evidence of acute respiratory failure.  Primary pretension -Continue amlodipine, metoprolol -Lasix and spironolactone held secondary to suspected dehydration  Elevated BUN Unclear etiology.  Possibly related to developing AKI from diuresis.  No evidence of GI bleeding noted, although patient has a history. BUN appears to have reached plateau. -Repeat BUN in AM -Hold spironolactone and Lasix  GERD -Continue Protonix  Paroxysmal atrial fibrillation Patient is managed on metoprolol as an outpatient.  Patient is not on anticoagulation secondary to history of severe GI bleeding -Continue metoprolol tartrate 12.5 mg twice  daily  Chronic heart failure with preserved EF Documentation states history of reduced EF, however all recent echocardiograms with normal LVEF suggesting least recovered EF.  Currently euvolemic and is on spironolactone and Lasix as an outpatient. -Strict in and out  CKD stage IIIb Currently stable with a baseline creatinine of 1.3.  Hypothyroidism -Continue Synthroid 50 mcg daily  CAD Hyperlipidemia Currently stable.  No chest pain.  Patient with elevated troponin of 23 > 27.  No concern for ACS.  Presumed demand ischemia. -Continue aspirin and Lipitor  Elevated glucose Previously documented as diabetes, however no prior documentation prior to admission. -Check hemoglobin A1c   DVT prophylaxis: Heparin subcu Code Status:   Code Status: DNR Family Communication: None at bedside Disposition Plan: Discharge home likely in 1 day pending stability of renal function   Consultants:  None  Procedures:  None  Antimicrobials: Doxycycline    Subjective: Continued cough. Ambulated to chair and bathroom today.  Objective: BP (!) 129/58 (BP Location: Right Arm)   Pulse 64   Temp 97.6 F (36.4 C) (Oral)   Resp 17   SpO2 93%   Examination:  General exam: Appears calm and comfortable Respiratory system: Clear to auscultation. Respiratory effort normal. Cardiovascular system: S1 & S2 heard, RRR. Gastrointestinal system: Abdomen is nondistended, soft and nontender. Normal bowel sounds heard. Central nervous system: Alert   Data Reviewed: I have personally reviewed following labs and imaging studies  CBC Lab Results  Component Value Date   WBC 9.9 05/13/2023   RBC 5.35 (H) 05/13/2023   HGB 15.1 (H) 05/13/2023   HCT 46.6 (H) 05/13/2023   MCV 87.1 05/13/2023   MCH 28.2 05/13/2023   PLT 266 05/13/2023   MCHC 32.4 05/13/2023   RDW 14.3 05/13/2023  LYMPHSABS 1.1 05/09/2023   MONOABS 1.3 (H) 05/09/2023   EOSABS 0.0 05/09/2023   BASOSABS 0.0 05/09/2023     Last  metabolic panel Lab Results  Component Value Date   NA 136 05/14/2023   K 4.2 05/14/2023   CL 98 05/14/2023   CO2 27 05/14/2023   BUN 50 (H) 05/14/2023   CREATININE 1.16 (H) 05/14/2023   GLUCOSE 112 (H) 05/14/2023   GFRNONAA 47 (L) 05/14/2023   GFRAA 50 (L) 09/10/2020   CALCIUM 8.8 (L) 05/14/2023   PHOS 3.6 10/13/2020   PROT 6.9 05/09/2023   ALBUMIN 3.0 (L) 05/09/2023   LABGLOB 3.8 01/09/2023   AGRATIO 0.9 (L) 01/09/2023   BILITOT 0.4 05/09/2023   ALKPHOS 69 05/09/2023   AST 18 05/09/2023   ALT 11 05/09/2023   ANIONGAP 11 05/14/2023    GFR: CrCl cannot be calculated (Unknown ideal weight.).  Recent Results (from the past 240 hour(s))  SARS Coronavirus 2 by RT PCR (hospital order, performed in [facility] hospital lab) *cepheid single result test* Anterior Nasal Swab     Status: None   Collection Time: 05/09/23  1:11 PM   Specimen: Anterior Nasal Swab  Result Value Ref Range Status   SARS Coronavirus 2 by RT PCR NEGATIVE NEGATIVE Final    Comment: Performed at [facility], [address], [place], [place] [address]      Radiology Studies: DG CHEST PORT 1 VIEW  Result Date: 05/13/2023 CLINICAL DATA:  Rales. EXAM: PORTABLE CHEST 1 VIEW COMPARISON:  05/10/2023 FINDINGS: Mild cardiac enlargement. Previous median sternotomy and CABG procedure. Aortic atherosclerotic calcifications. No pleural fluid, interstitial edema or airspace disease. Bilateral breast implant capsular calcification. Previous right shoulder arthroplasty. IMPRESSION: No acute cardiopulmonary abnormalities. Electronically Signed   By: Signa Kell M.D.   On: 05/13/2023 12:14      LOS: 4 days    Jacquelin Hawking, MD [facility] 05/14/2023, 12:26 PM   If 7PM-7AM, please contact night-coverage [email or web address]

## 2023-05-14 NOTE — Progress Notes (Signed)
Occupational Therapy Treatment Patient Details Name: [name] MRN: [id number] DOB: October 27, 1941 Today's Date: 05/14/2023   History of present illness 82 y.o. female presents to [facility] hospital on 05/09/2023 with cough, wheezing, SOB. Pt admitted for COPD exacerbation. PMH includes COPD, chronic respiratory failure on 3L [place], PAF, CABG, chronic HFrEF, HTN, CKDIII, DMII.   OT comments  Pt progressing towards acute OT goals. Focus of session was toilet transfer and working on building OOB activity tolerance to facilitate ADLs. Pt reports feeling poorly today but agreeable to session. Ultimately, pt able to walk to the bathroom, toilet, then transfer to chair to sit up for a bit. SpO2 96 at end of session. Of note, pt c/o tightness and some edema just below knee and distal, erythema; nursing notified at end of session. D/c recommendation remains appropriate.    Recommendations for follow up therapy are one component of a multi-disciplinary discharge planning process, led by the attending physician.  Recommendations may be updated based on patient status, additional functional criteria and insurance authorization.    Assistance Recommended at Discharge Intermittent Supervision/Assistance  Patient can return home with the following  Assist for transportation;Assistance with cooking/housework   Equipment Recommendations  None recommended by OT    Recommendations for Other Services      Precautions / Restrictions Precautions Precautions: Fall Precaution Comments: watch SpO2 Restrictions Weight Bearing Restrictions: No       Mobility Bed Mobility Overal bed mobility: Needs Assistance Bed Mobility: Supine to Sit     Supine to sit: Supervision, HOB elevated     General bed mobility comments: extra time and effort    Transfers Overall transfer level: Needs assistance Equipment used: Rolling walker (2 wheels) Transfers: Sit to/from Stand Sit to Stand: Min guard, Min assist            General transfer comment: assist to steady walker as pt pulling up on rw despite cueing for hand placement. light steadying assist. extra time and effort     Balance Overall balance assessment: Needs assistance Sitting-balance support: No upper extremity supported, Feet supported Sitting balance-Leahy Scale: Fair     Standing balance support: Reliant on assistive device for balance, Bilateral upper extremity supported Standing balance-Leahy Scale: Poor Standing balance comment: Reliant on RW                           ADL either performed or assessed with clinical judgement   ADL Overall ADL's : Needs assistance/impaired                         Toilet Transfer: Min guard;Ambulation;Comfort height toilet;Rolling walker (2 wheels);Minimal assistance   Toileting- Clothing Manipulation and Hygiene: Minimal assistance;Sit to/from stand Toileting - Clothing Manipulation Details (indicate cue type and reason): assist with clothing management, light steadying assist during sit<>stand     Functional mobility during ADLs: Min guard;Rolling walker (2 wheels) General ADL Comments: Pt received in bed. Pt walked to bathroom, completed toileting tasks as detailed above. Pt up in chair at end of session.    Extremity/Trunk Assessment Upper Extremity Assessment Upper Extremity Assessment: Generalized weakness   Lower Extremity Assessment Lower Extremity Assessment: Defer to PT evaluation (tighness and some edema just below knee and distal, erythema. pt reports tender. nursing notified at end of session)        Vision       Perception     Praxis  Cognition Arousal/Alertness: Awake/alert Behavior During Therapy: WFL for tasks assessed/performed Overall Cognitive Status: Within Functional Limits for tasks assessed                                          Exercises      Shoulder Instructions       General Comments Pt received on  2L, increased to baseline 3L for session. SpO2 end of session 96.    Pertinent Vitals/ Pain       Pain Assessment Pain Assessment: Faces Faces Pain Scale: Hurts even more Pain Location: "all over", BLE just below knee and distal Pain Descriptors / Indicators: Discomfort, Aching, Tender Pain Intervention(s): Monitored during session, Limited activity within patient's tolerance, Repositioned  Home Living                                          Prior Functioning/Environment              Frequency  Min 2X/week        Progress Toward Goals  OT Goals(current goals can now be found in the care plan section)  Progress towards OT goals: Progressing toward goals  Acute Rehab OT Goals Patient Stated Goal: return home OT Goal Formulation: With patient Time For Goal Achievement: 05/25/23 Potential to Achieve Goals: Good ADL Goals Pt Will Perform Grooming: with modified independence;standing Pt Will Perform Lower Body Dressing: with modified independence;sit to/from stand Pt Will Transfer to Toilet: with modified independence;ambulating;regular height toilet  Plan Discharge plan remains appropriate    Co-evaluation                 AM-PAC OT "6 Clicks" Daily Activity     Outcome Measure   Help from another person eating meals?: None Help from another person taking care of personal grooming?: A Little Help from another person toileting, which includes using toliet, bedpan, or urinal?: A Little Help from another person bathing (including washing, rinsing, drying)?: A Little Help from another person to put on and taking off regular upper body clothing?: None Help from another person to put on and taking off regular lower body clothing?: A Little 6 Click Score: 20    End of Session Equipment Utilized During Treatment: Rolling walker (2 wheels);Oxygen  OT Visit Diagnosis: Unsteadiness on feet (R26.81)   Activity Tolerance Patient tolerated treatment  well   Patient Left in chair;with call bell/phone within reach   Nurse Communication          Time: 1610-9604 OT Time Calculation (min): 21 min  Charges: OT General Charges $OT Visit: 1 Visit OT Treatments $Self Care/Home Management : 8-22 mins  Raynald Kemp, OT Acute Rehabilitation Services Office: [phone]   Pilar Grammes 05/14/2023, 10:16 AM

## 2023-05-14 NOTE — Plan of Care (Signed)
  Problem: Education: Goal: Knowledge of disease or condition will improve Outcome: Progressing Goal: Knowledge of the prescribed therapeutic regimen will improve Outcome: Progressing Goal: Individualized Educational Video(s) Outcome: Progressing   Problem: Activity: Goal: Ability to tolerate increased activity will improve Outcome: Progressing Goal: Will verbalize the importance of balancing activity with adequate rest periods Outcome: Progressing   Problem: Respiratory: Goal: Ability to maintain a clear airway will improve Outcome: Progressing Goal: Levels of oxygenation will improve Outcome: Progressing Goal: Ability to maintain adequate ventilation will improve Outcome: Progressing   Problem: Education: Goal: Knowledge of General Education information will improve Description: Including pain rating scale, medication(s)/side effects and non-pharmacologic comfort measures Outcome: Progressing   Problem: Health Behavior/Discharge Planning: Goal: Ability to manage health-related needs will improve Outcome: Progressing   Problem: Clinical Measurements: Goal: Ability to maintain clinical measurements within normal limits will improve Outcome: Progressing Goal: Will remain free from infection Outcome: Progressing Goal: Diagnostic test results will improve Outcome: Progressing Goal: Respiratory complications will improve Outcome: Progressing Goal: Cardiovascular complication will be avoided Outcome: Progressing   Problem: Activity: Goal: Risk for activity intolerance will decrease Outcome: Progressing   Problem: Nutrition: Goal: Adequate nutrition will be maintained Outcome: Progressing   Problem: Coping: Goal: Level of anxiety will decrease Outcome: Progressing   Problem: Elimination: Goal: Will not experience complications related to bowel motility Outcome: Progressing Goal: Will not experience complications related to urinary retention Outcome: Progressing    Problem: Pain Managment: Goal: General experience of comfort will improve Outcome: Progressing   Problem: Safety: Goal: Ability to remain free from injury will improve Outcome: Progressing   Problem: Skin Integrity: Goal: Risk for impaired skin integrity will decrease Outcome: Progressing   Problem: Education: Goal: Ability to describe self-care measures that may prevent or decrease complications (Diabetes Survival Skills Education) will improve Outcome: Progressing Goal: Individualized Educational Video(s) Outcome: Progressing   Problem: Coping: Goal: Ability to adjust to condition or change in health will improve Outcome: Progressing   Problem: Fluid Volume: Goal: Ability to maintain a balanced intake and output will improve Outcome: Progressing   Problem: Health Behavior/Discharge Planning: Goal: Ability to identify and utilize available resources and services will improve Outcome: Progressing Goal: Ability to manage health-related needs will improve Outcome: Progressing   Problem: Metabolic: Goal: Ability to maintain appropriate glucose levels will improve Outcome: Progressing   Problem: Nutritional: Goal: Maintenance of adequate nutrition will improve Outcome: Progressing Goal: Progress toward achieving an optimal weight will improve Outcome: Progressing   Problem: Skin Integrity: Goal: Risk for impaired skin integrity will decrease Outcome: Progressing   Problem: Tissue Perfusion: Goal: Adequacy of tissue perfusion will improve Outcome: Progressing   

## 2023-05-15 DIAGNOSIS — J441 Chronic obstructive pulmonary disease with (acute) exacerbation: Secondary | ICD-10-CM | POA: Diagnosis not present

## 2023-05-15 DIAGNOSIS — R7309 Other abnormal glucose: Secondary | ICD-10-CM | POA: Insufficient documentation

## 2023-05-15 LAB — BUN: BUN: 43 mg/dL — ABNORMAL HIGH (ref 8–23)

## 2023-05-15 LAB — HEMOGLOBIN A1C
Hgb A1c MFr Bld: 6.5 % — ABNORMAL HIGH (ref 4.8–5.6)
Mean Plasma Glucose: 140 mg/dL

## 2023-05-15 LAB — GLUCOSE, CAPILLARY
Glucose-Capillary: 128 mg/dL — ABNORMAL HIGH (ref 70–99)
Glucose-Capillary: 106 mg/dL — ABNORMAL HIGH (ref 70–99)

## 2023-05-15 MED ORDER — TRIAMCINOLONE 0.1 % CREAM:EUCERIN CREAM 1:1: 1.0000 | TOPICAL_CREAM | Freq: Two times a day (BID) | CUTANEOUS | Status: DC

## 2023-05-15 MED ORDER — SPIRONOLACTONE 25 MG PO TABS: 25.0000 mg | ORAL_TABLET | Freq: Every day | ORAL | 2 refills | Status: DC

## 2023-05-15 MED ORDER — ENSURE ENLIVE PO LIQD: 237.0000 mL | Freq: Two times a day (BID) | ORAL | Status: DC

## 2023-05-15 MED ORDER — FUROSEMIDE 80 MG PO TABS: 80.0000 mg | ORAL_TABLET | ORAL | Status: DC

## 2023-05-15 MED ORDER — ENSURE ENLIVE PO LIQD: 237.0000 mL | Freq: Two times a day (BID) | ORAL | Status: AC

## 2023-05-15 MED ORDER — BENZONATATE 100 MG PO CAPS: 100.0000 mg | ORAL_CAPSULE | Freq: Three times a day (TID) | ORAL | 0 refills | Status: AC | PRN

## 2023-05-15 MED ORDER — GUAIFENESIN ER 600 MG PO TB12: 600.0000 mg | ORAL_TABLET | Freq: Two times a day (BID) | ORAL | 0 refills | Status: AC

## 2023-05-15 MED ORDER — POTASSIUM CHLORIDE CRYS ER 20 MEQ PO TBCR: 20.0000 meq | EXTENDED_RELEASE_TABLET | Freq: Every day | ORAL | Status: DC

## 2023-05-15 MED ORDER — METOLAZONE 2.5 MG PO TABS: 2.5000 mg | ORAL_TABLET | ORAL | 0 refills | Status: DC

## 2023-05-15 MED ORDER — TRIAMCINOLONE 0.1 % CREAM:EUCERIN CREAM 1:1: 1.0000 | TOPICAL_CREAM | Freq: Two times a day (BID) | CUTANEOUS | Status: AC

## 2023-05-15 MED ORDER — TRIAMCINOLONE 0.1 % CREAM:EUCERIN CREAM 1:1
TOPICAL_CREAM | Freq: Two times a day (BID) | CUTANEOUS | Status: DC
  Filled 2023-05-15: qty 1

## 2023-05-15 MED ORDER — LIVING WELL WITH DIABETES BOOK
Freq: Once | Status: AC
  Filled 2023-05-15: qty 1

## 2023-05-15 MED ORDER — ADULT MULTIVITAMIN W/MINERALS CH: 1.0000 | ORAL_TABLET | Freq: Every day | ORAL | Status: AC

## 2023-05-15 MED ORDER — ADULT MULTIVITAMIN W/MINERALS CH: 1.0000 | ORAL_TABLET | Freq: Every day | ORAL | Status: DC

## 2023-05-15 MED ORDER — MELATONIN 10 MG PO TABS: 10.0000 mg | ORAL_TABLET | Freq: Every day | ORAL | Status: AC

## 2023-05-15 NOTE — Progress Notes (Addendum)
SATURATION QUALIFICATIONS: (This note is used to comply with regulatory documentation for home oxygen)  Patient Saturations on Room Air at Rest = 88%  Patient Saturations on Room Air while Ambulating = n/a  Patient Saturations on 3 Liters of oxygen while Ambulating = 95%  Please briefly explain why patient needs home oxygen:

## 2023-05-15 NOTE — Progress Notes (Signed)
Physical Therapy Treatment Patient Details Name: [name] MRN: [id number] DOB: 10/19/1941 Today's Date: 05/15/2023   History of Present Illness 82 y.o. female presents to [facility] hospital on 05/09/2023 with cough, wheezing, SOB. Pt admitted for COPD exacerbation. PMH includes COPD, chronic respiratory failure on 3L [place], PAF, CABG, chronic HFrEF, HTN, CKDIII, DMII.    PT Comments    Pt received in the bathroom and agreeable to session with family member present. Pt able to perform hygiene and stand at sink without UE support to wash hands with min guard for safety. Pt able to tolerate increased gait distance without a rest break this session, however reports DOE and appears to be breathing heavily through her mouth. Pt instructed to breath through her nose with the nasal cannula, however pt stating "I can't right now". SpO2 reading >93% at this time. Educated pt on importance of activity pacing, safety regarding O2 hose as a fall risk, and O2 sat monitoring at home. Pt continues to benefit from PT services to progress toward functional mobility goals.    Recommendations for follow up therapy are one component of a multi-disciplinary discharge planning process, led by the attending physician.  Recommendations may be updated based on patient status, additional functional criteria and insurance authorization.     Assistance Recommended at Discharge Intermittent Supervision/Assistance  Patient can return home with the following A little help with bathing/dressing/bathroom;Assistance with cooking/housework;Assist for transportation;Help with stairs or ramp for entrance;A little help with walking and/or transfers   Equipment Recommendations  None recommended by PT    Recommendations for Other Services       Precautions / Restrictions Precautions Precautions: Fall Precaution Comments: watch SpO2 Restrictions Weight Bearing Restrictions: No     Mobility  Bed Mobility                General bed mobility comments: Pt OOB upon arrival    Transfers Overall transfer level: Needs assistance Equipment used:  (grab bar in bathroom) Transfers: Sit to/from Stand Sit to Stand: Supervision           General transfer comment: From low toilet with good power up. Close supervision for safety    Ambulation/Gait Ambulation/Gait assistance: Min guard, Supervision Gait Distance (Feet): 75 Feet Assistive device: Rolling walker (2 wheels) Gait Pattern/deviations: Step-through pattern, Trunk flexed, Shuffle Gait velocity: reduced     General Gait Details: Cues for RW proximity and upright posture.       Balance Overall balance assessment: Needs assistance Sitting-balance support: No upper extremity supported, Feet supported Sitting balance-Leahy Scale: Fair Sitting balance - Comments: sitting on toilet   Standing balance support: Reliant on assistive device for balance, Bilateral upper extremity supported Standing balance-Leahy Scale: Poor Standing balance comment: Reliant on RW                            Cognition Arousal/Alertness: Awake/alert Behavior During Therapy: WFL for tasks assessed/performed Overall Cognitive Status: Within Functional Limits for tasks assessed                                          Exercises      General Comments General comments (skin integrity, edema, etc.): Pt in the bathroom upon arrival without O2 nasal cannula and SpO2 at 77%. Donned 3L O2 with SpO2 at 88%. Pt on 3L O2 throughout remainder  of session. SpO2 at 94% at end of session sitting in the recliner after gait trial. Pt reporting DOE after gait trial and was instructed to breath through her nose with pt stating "I can't right now".      Pertinent Vitals/Pain Pain Assessment Pain Assessment: Faces Faces Pain Scale: Hurts little more Pain Location: neck Pain Descriptors / Indicators: Discomfort, Aching Pain Intervention(s): Monitored  during session     PT Goals (current goals can now be found in the care plan section) Acute Rehab PT Goals Patient Stated Goal: to improve activity tolerance PT Goal Formulation: With patient/family Time For Goal Achievement: 05/25/23 Potential to Achieve Goals: Good Progress towards PT goals: Progressing toward goals    Frequency    Min 3X/week      PT Plan Current plan remains appropriate       AM-PAC PT "6 Clicks" Mobility   Outcome Measure  Help needed turning from your back to your side while in a flat bed without using bedrails?: A Little Help needed moving from lying on your back to sitting on the side of a flat bed without using bedrails?: A Little Help needed moving to and from a bed to a chair (including a wheelchair)?: A Little Help needed standing up from a chair using your arms (e.g., wheelchair or bedside chair)?: A Little Help needed to walk in hospital room?: A Little Help needed climbing 3-5 steps with a railing? : A Little 6 Click Score: 18    End of Session Equipment Utilized During Treatment: Oxygen;Gait belt Activity Tolerance: Patient limited by fatigue Patient left: in chair;with call bell/phone within reach;with family/visitor present Nurse Communication: Mobility status PT Visit Diagnosis: Other abnormalities of gait and mobility (R26.89);Muscle weakness (generalized) (M62.81);Unsteadiness on feet (R26.81)     Time: 1610-9604 PT Time Calculation (min) (ACUTE ONLY): 16 min  Charges:  $Gait Training: 8-22 mins                     Johny Shock, PTA Acute Rehabilitation Services Secure Chat Preferred  [phone]    Johny Shock 05/15/2023, 1:21 PM

## 2023-05-15 NOTE — Progress Notes (Signed)
Mobility Specialist Progress Note   05/15/23 1055  Mobility  Activity Ambulated with assistance in hallway  Level of Assistance Contact guard assist, steadying assist  Assistive Device Front wheel walker  Distance Ambulated (ft) 40 ft  Range of Motion/Exercises Active;All extremities  Activity Response Tolerated well   Pre Ambulation:  HR 70, SpO2 88% RA During Ambulation: HR 81, SpO2 95% 3LO2 Post Ambulation: HR 77, SpO2 97% 2LO2  Patient received in recliner and agreeable to participate. Completed education on energy conservation and the differences between RW and cane. Opted to use RW instead of cane this session. Ambulated min guard with slow steady gait. Distance limited secondary to complaints of dizziness. Oxygen saturation maintained >95% on 3LO2 during ambulation. Returned to room without incident. Dizziness dissipated slowly with seated rest. Was left in recliner with all needs met, call bell in reach.   [name], BS EXP Mobility Specialist Please contact via SecureChat or Rehab office at [phone]

## 2023-05-15 NOTE — TOC Progression Note (Signed)
Transition of Care [facility]) - Progression Note    Patient Details  Name: Cande Hammerman MRN: [id number] Date of Birth: [birth year]  [facility] of [facility]) CM/SW Contact  Gordy Clement, RN Phone Number: 05/15/2023, 11:32 AM  Clinical Narrative:     Plan is to still dc to home with Twin Granddaughters living right behind her. [facility] will provide Home Health PT and OT  Order is in . [facility] will have increased O2 supply in the home. Called and spoke with Huntley Dec at [facility]   [phone]. Stated she messaged previous CM through [employer] that originally requested increased O2 supply  to tell her that [facility] is no longer accepting this Patient's insurance.  [employer] has been contacted with request for portable and home O2 and will deliver    TOC will continue to follow patient for any additional discharge needs        Expected Discharge Plan: Home w Home Health Services (Dispo TBD but poissibly Home w/HH) Barriers to Discharge: Continued Medical Work up  Expected Discharge Plan and Services       Living arrangements for the past 2 months: Single Family Home                 DME Arranged: Oxygen   Date DME Agency Contacted: 05/12/23 Time DME Agency Contacted: [phone] Representative spoke with at DME Agency: [facility]   HH Agency:  (Patient has had [facility] in the past)         Social Determinants of Health (SDOH) Interventions SDOH Screenings   Food Insecurity: No Food Insecurity (05/12/2023)  Housing: Patient Declined (05/12/2023)  Transportation Needs: No Transportation Needs (05/12/2023)  Utilities: Not At Risk (05/12/2023)  Alcohol Screen: Low Risk  (11/11/2021)  Depression (PHQ2-9): Low Risk  (04/22/2022)  Physical Activity: Inactive (04/22/2022)  Tobacco Use: Medium Risk (05/09/2023)    Readmission Risk Interventions    05/13/2021    1:12 PM  Readmission Risk Prevention Plan  Transportation Screening Complete  Medication Review [employer]) Complete   HRI or Home Care Consult Complete  SW Recovery Care/Counseling Consult Complete  Palliative Care Screening Not Applicable  Skilled Nursing Facility Not Applicable

## 2023-05-15 NOTE — Inpatient Diabetes Management (Signed)
Inpatient Diabetes Program Recommendations  AACE/ADA: New Consensus Statement on Inpatient Glycemic Control (2015)  Target Ranges:  Prepandial:   less than 140 mg/dL      Peak postprandial:   less than 180 mg/dL (1-2 hours)      Critically ill patients:  140 - 180 mg/dL   Lab Results  Component Value Date   GLUCAP 106 (H) 05/15/2023   HGBA1C 6.5 (H) 05/14/2023    Review of Glycemic Control  Latest Reference Range & Units 05/14/23 07:43 05/14/23 12:40 05/14/23 16:44 05/14/23 21:16 05/15/23 08:31 05/15/23 11:39  Glucose-Capillary 70 - 99 mg/dL 829 (H) 562 (H) 130 (H) 147 (H) 128 (H) 106 (H)  (H): Data is abnormally high  Latest Reference Range & Units 05/14/23 02:07  GFR, Estimated >60 mL/min 47 (L)  (L): Data is abnormally low  Diabetes history: DM2-New? Outpatient Diabetes medications: Farxiga 10 mg QD Current orders for Inpatient glycemic control: Novolog 0-9 units TID  Received referral for new diabetes.  PT is currently working with patient.  Ordered The Living Well with Diabetes Booklet.  Currently taking Farxiga 10 mg QD.  Spoke with daughter at bedside about new diagnosis. Discussed A1C results and explained what an A1C is, basic pathophysiology of DM Type 2, basic home care, basic diabetes diet nutrition principles, importance of maintaining good glucose control to prevent long-term and short-term complications.  Daughter verbalize understanding.    Will continue to follow while inpatient.  Thank you, Dulce Sellar, MSN, CDCES Diabetes Coordinator Inpatient Diabetes Program [phone] (team pager from 8a-5p)

## 2023-05-15 NOTE — Care Management Important Message (Signed)
Important Message  Patient Details  Name: [name] MRN: [id number] Date of Birth: 1941/10/12   Medicare Important Message Given:  Yes     Sherilyn Banker 05/15/2023, 3:00 PM

## 2023-05-15 NOTE — Discharge Summary (Addendum)
Physician Discharge Summary   Patient: [name] MRN: [id number] DOB: 1941-11-28  Admit date:     05/09/2023  Discharge date: 05/15/23  Discharge Physician: Jacquelin Hawking, MD   PCP: Leonia Reader, Barbara Cower, MD   Recommendations at discharge:  Hospital follow-up with PCP in 1 week Repeat metabolic panel in 3 to 5 days  Discharge Diagnoses: Principal Problem:   COPD exacerbation (HCC) Active Problems:   Hypothyroidism   Dyslipidemia   Protein-calorie malnutrition, severe (HCC)   Acute on chronic respiratory failure with hypoxia (HCC)   Chronic diastolic heart failure (HCC)   GERD without esophagitis   CAD (coronary artery disease) of bypass graft   Prolonged QT interval   Depression   Chronic heart failure with preserved ejection fraction (HCC)   Memory loss   Paroxysmal atrial fibrillation (HCC)   COPD (chronic obstructive pulmonary disease) (HCC)   Elevated hemoglobin A1c  Resolved Problems:   * No resolved hospital problems. *  Hospital Course: [name] is a 82 y.o. female with a history of COPD Gold stage III with chronic restrictive lung disease, chronic respiratory failure with hypoxia on 4 L/min of supplemental oxygen, paroxysmal atrial fibrillation, CAD status post CABG, chronic heart failure with preserved EF, hypertension, CKD stage IIIa, diabetes mellitus type 2 on insulin.  Patient was in secondary to cough, wheezing, shortness of breath with concern for COPD exacerbation.  Patient started on Solu-Medrol, DuoNebs and ongoing supportive care.  Assessment and Plan:  COPD exacerbation Present on admission.  Patient with associated dyspnea and cough.  Patient started on IV Solu-Medrol in addition to DuoNebs.  PT/OT recommending home health therapy.  Patient still with dyspnea which may be near baseline.  No current wheezing noted on exam, but does have rales.  Repeat chest x-ray without evidence of pulmonary edema.  She completed prednisone and doxycycline  prior to discharge.   Chronic respiratory failure Patient states she uses oxygen chronically and uses 4 L/min of supplemental oxygen via nasal canula. No evidence of acute respiratory failure.  Patient ambulated prior to discharge and was requiring 3 L/min supplemental oxygen with ambulation.   Primary hypertension Continue amlodipine and metoprolol.   Elevated BUN Unclear etiology.  Possibly related to developing AKI from diuresis.  No evidence of GI bleeding noted, although patient has a history. BUN trending down prior to discharge.  Recommendation to hold diuretics until patient has improved her oral intake.   GERD Continue Protonix   Paroxysmal atrial fibrillation Patient is managed on metoprolol as an outpatient.  Patient is not on anticoagulation secondary to history of severe GI bleeding. Continue metoprolol tartrate 12.5 mg twice daily.   Chronic heart failure with preserved EF Documentation states history of reduced EF, however all recent echocardiograms with normal LVEF suggesting least recovered EF.  Currently euvolemic and is on spironolactone metolazone and Lasix as an outpatient.  Recommend to patient that she should hold her diuretics on discharge until she is eating and drinking baseline.   CKD stage IIIb Currently stable with a baseline creatinine of 1.3.   Hypothyroidism Continue Synthroid 50 mcg daily   CAD Hyperlipidemia Currently stable.  No chest pain.  Patient with elevated troponin of 23 > 27.  No concern for ACS.  Presumed demand ischemia. Continue aspirin and Lipitor.  Bilateral lower leg dermatitis Likely related to stasis dermatitis.  Some improvement prior to discharge.  Patient discharged on Eucerin/triamcinolone cream.  Recommendation to keep the legs moisturized.  Patient to keep swelling to  a minimum to decrease current issues with repeat dermatitis.   Elevated hemoglobin A1c Hemoglobin A1c of 6.5%.  Concern for possible diabetes mellitus.  Will  need repeat testing as an outpatient.  Recommend the patient follows with PCP.  Patient is already on Farxiga for her heart failure which would help with diabetes management if needed.   Severe malnutrition Dietician recommendations:  Liberalize diet to regular Ensure Enlive po BID, each supplement provides 350 kcal and 20 grams of protein. MVI with minerals daily "High Calorie, High Protein Nutrition Therapy" handout added to AVS   Consultants: None Procedures performed: None Disposition: Home health Diet recommendation: Carb modified diet   DISCHARGE MEDICATION: Allergies as of 05/15/2023       Reactions   Ativan [lorazepam] Other (See Comments)   Confusion    Pitavastatin Itching, Other (See Comments)   Reaction to Livalo   Ropinirole Nausea Only, Other (See Comments)   Makes legs jump, also   Zofran [ondansetron Hcl] Nausea Only   Zolpidem Tartrate Anxiety, Other (See Comments)   CONFUSION    Zolpidem Tartrate Swelling   CONFUSION   Penicillins Rash, Other (See Comments)   Tolerated cephalosporins in past Has patient had a PCN reaction causing immediate rash, facial/tongue/throat swelling, SOB or lightheadedness with hypotension: Yes Has patient had a PCN reaction causing severe rash involving mucus membranes or skin necrosis: No Has patient had a PCN reaction that required hospitalization: No Has patient had a PCN reaction occurring within the last 10 years: No If all of the above answers are "NO", then may proceed with Cephalosporin use.        Medication List     STOP taking these medications    GOODY HEADACHE PO   magnesium gluconate 500 MG tablet Commonly known as: MAGONATE   NyQuil Severe Cold/Flu 5-6.25-10-325 MG/15ML Liqd Generic drug: Phenyleph-Doxylamine-DM-APAP   Vicks DayQuil Severe Cold/Flu 5-10-200-325 MG/15ML Liqd Generic drug: Phenylephrine-DM-GG-APAP       TAKE these medications    acetaminophen 500 MG tablet Commonly known as:  TYLENOL Take 1,000 mg by mouth daily.   albuterol (2.5 MG/3ML) 0.083% nebulizer solution Commonly known as: PROVENTIL Take 2.5 mg by nebulization every 6 (six) hours as needed for wheezing or shortness of breath.   allopurinol 100 MG tablet Commonly known as: ZYLOPRIM TAKE 1 TABLET BY MOUTH DAILY WITH BREAKFAST.   amLODipine 2.5 MG tablet Commonly known as: NORVASC Take 1/2 tablet as needed for BP greater than 160.   ARIPiprazole 2 MG tablet Commonly known as: ABILIFY TAKE 1 TABLET BY MOUTH ONCE DAILY   ascorbic acid 500 MG tablet Commonly known as: VITAMIN C Take 500 mg by mouth daily with lunch.   aspirin EC 81 MG tablet Take 81 mg by mouth daily with breakfast. Swallow whole.   atorvastatin 20 MG tablet Commonly known as: LIPITOR TAKE 1 TABLET ONCE DAILY AT BEDTIME What changed: See the new instructions.   benzonatate 100 MG capsule Commonly known as: Tessalon Perles Take 1 capsule (100 mg total) by mouth 3 (three) times daily as needed for up to 5 days for cough.   cholecalciferol 25 MCG (1000 UNIT) tablet Commonly known as: VITAMIN D3 Take 1 tablet (1,000 Units total) by mouth daily with supper.   D3-1000 25 MCG (1000 UT) capsule Generic drug: Cholecalciferol TAKE 1 CAPSULE BY MOUTH DAILY WITH SUPPER.   citalopram 10 MG tablet Commonly known as: CELEXA TAKE 1 TABLET BY MOUTH ONCE DAILY   Combivent Respimat 20-100 MCG/ACT  Aers respimat Generic drug: Ipratropium-Albuterol Inhale 1 puff into the lungs 3 (three) times daily as needed for wheezing or shortness of breath.   cyclobenzaprine 10 MG tablet Commonly known as: FLEXERIL Take 10 mg by mouth 3 (three) times daily as needed for muscle spasms.   dapagliflozin propanediol 10 MG Tabs tablet Commonly known as: Farxiga Take 1 tablet (10 mg total) by mouth daily before breakfast.   donepezil 5 MG tablet Commonly known as: Aricept Take 1 tablet (5 mg total) by mouth at bedtime.   donepezil 10 MG  tablet Commonly known as: Aricept Take 1 tablet (10 mg total) by mouth at bedtime. Start taking on: May 30, 2023   DSS 100 MG Caps Take 100 mg by mouth daily as needed (constipation).   feeding supplement Liqd Take 237 mLs by mouth 2 (two) times daily between meals.   ferrous sulfate 325 (65 FE) MG tablet TAKE 1 TABLET 2 TIMES PER DAY What changed: See the new instructions.   fluticasone-salmeterol 250-50 MCG/ACT Aepb Commonly known as: Advair Diskus Inhale 1 puff into the lungs in the morning and at bedtime.   furosemide 80 MG tablet Commonly known as: LASIX Take 1 tablet (80 mg total) by mouth every other day. PLEASE RESUME THIS MEDICATION ONCE YOU ARE EATING AND DRINKING BETTER. What changed: additional instructions   gabapentin 100 MG capsule Commonly known as: NEURONTIN TAKE 2 CAPSULES BY MOUTH TWICE DAILY What changed:  how much to take additional instructions   guaiFENesin 600 MG 12 hr tablet Commonly known as: MUCINEX Take 1 tablet (600 mg total) by mouth 2 (two) times daily for 5 days.   levothyroxine 50 MCG tablet Commonly known as: SYNTHROID TAKE 1 TABLET BY MOUTH DAILY BEFORE BREAKFAST.   Magnesium Oxide -Mg Supplement 500 MG Caps TAKE 1 CAPSULE BY MOUTH DAILY   Melatonin 10 MG Tabs Take 10 mg by mouth at bedtime. What changed: how much to take   metolazone 2.5 MG tablet Commonly known as: ZAROXOLYN Take 1 tablet (2.5 mg total) by mouth once a week. Take 30 minutes before Lasix (furosemide). PLEASE RESUME THIS MEDICATION ONCE YOU ARE EATING AND DRINKING BETTER. What changed: additional instructions   metoprolol tartrate 25 MG tablet Commonly known as: LOPRESSOR TAKE 1/2 TABLET BY MOUTH TWICE DAILY   multivitamin with minerals Tabs tablet Take 1 tablet by mouth daily.   nitroGLYCERIN 0.4 MG SL tablet Commonly known as: NITROSTAT Place 1 tablet (0.4 mg total) under the tongue every 5 (five) minutes as needed for chest pain. For chest pain    OXYGEN Inhale 4 L/min into the lungs as needed (DURING ALL TIMES OF EXERTION).   pantoprazole 40 MG tablet Commonly known as: PROTONIX TAKE 1 TABLET ONCE DAILY   pilocarpine 1 % ophthalmic solution Commonly known as: PILOCAR Place 1 drop into both eyes 2 (two) times daily.   polyethylene glycol 17 g packet Commonly known as: MIRALAX / GLYCOLAX Take 17 g by mouth as needed.   potassium chloride SA 20 MEQ tablet Commonly known as: KLOR-CON M Take 1 tablet (20 mEq total) by mouth daily. Break in half. PLEASE USE THIS MEDICATION WHEN TAKING LASIX. What changed: See the new instructions.   pramipexole 0.25 MG tablet Commonly known as: MIRAPEX TAKE 1 TABLET 2-3 HOURS BEFORE BEDTIME What changed: See the new instructions.   roflumilast 500 MCG Tabs tablet Commonly known as: DALIRESP TAKE 1 TABLET ONCE DAILY   spironolactone 25 MG tablet Commonly known as: ALDACTONE Take  1 tablet (25 mg total) by mouth daily. PLEASE RESUME THIS MEDICATION ONCE YOU ARE EATING AND DRINKING BETTER. What changed: additional instructions   traZODone 50 MG tablet Commonly known as: DESYREL TAKE 3 TABLETS EVERY NIGHT AT BEDTIME. What changed: See the new instructions.   triamcinolone 0.1 % cream : eucerin Crea Apply 1 Application topically 2 (two) times daily for 7 days.   Xtampza ER 9 MG C12a Generic drug: oxyCODONE ER Take 9 mg by mouth in the morning, at noon, and at bedtime.               Durable Medical Equipment  (From admission, onward)           Start     Ordered   05/12/23 1525  For home use only DME oxygen  Once       Question Answer Comment  Length of Need 6 Months   Mode or (Route) Nasal cannula   Liters per Minute 3   Frequency Continuous (stationary and portable oxygen unit needed)   Oxygen conserving device Yes   Oxygen delivery system Gas      05/12/23 1525            Follow-up Information     [facility] Follow up.   Why: [facility] is your O2 provider. They have been contacted regarding yor new requirement of O2 and will be providing you with the necessary equipment Contact information: [address] [place] [address] [phone]         [facility] Follow up.   Specialty: Home Health Services Why: [facility] will be providing your Home Health PT and OT.  They will contact you within 48 hours of your discharge to home Contact information: [address] [place] [address] [phone]         Crist Fat, MD. Schedule an appointment as soon as possible for a visit in 1 week(s).   Specialty: Internal Medicine Why: For hospital follow-up Contact information: [address] [place] [address] [phone]                Discharge Exam: BP 138/64 (BP Location: Left Arm)   Pulse 62   Temp 98.7 F (37.1 C) (Oral)   Resp 18   SpO2 95%   General exam: Appears calm and comfortable  Respiratory system: Clear to auscultation. Respiratory effort normal. Cardiovascular system: S1 & S2 heard, RRR. No murmurs. Gastrointestinal system: Abdomen is nondistended, soft and nontender. Normal bowel sounds heard. Central nervous system: Alert and oriented. No focal neurological deficits. Musculoskeletal: No edema. No calf tenderness Skin: Bilateral lower legs with dry skin and faint erythema Psychiatry: Judgement and insight appear normal. Mood & affect appropriate.   Condition at discharge: fair  The results of significant diagnostics from this hospitalization (including imaging, microbiology, ancillary and laboratory) are listed below for reference.   Imaging Studies: DG CHEST PORT 1 VIEW  Result Date: 05/13/2023 CLINICAL DATA:  Rales. EXAM: PORTABLE CHEST 1 VIEW COMPARISON:  05/10/2023 FINDINGS: Mild cardiac enlargement. Previous median sternotomy and CABG procedure. Aortic atherosclerotic calcifications. No pleural fluid, interstitial  edema or airspace disease. Bilateral breast implant capsular calcification. Previous right shoulder arthroplasty. IMPRESSION: No acute cardiopulmonary abnormalities. Electronically Signed   By: Signa Kell M.D.   On: 05/13/2023 12:14   DG Chest 1 View  Result Date: 05/10/2023 CLINICAL DATA:  CHF, shortness of breath EXAM: CHEST  1 VIEW COMPARISON:  05/09/2023 FINDINGS: Prior CABG. Cardiomegaly, aortic atherosclerosis. Biapical scarring. No acute confluent opacities, effusions or edema. No acute bony abnormality IMPRESSION: Cardiomegaly.  Chronic changes within the lungs.  No active disease. Electronically Signed   By: Charlett Nose M.D.   On: 05/10/2023 07:34   DG Chest Portable 1 View  Result Date: 05/09/2023 CLINICAL DATA:  Cough, COPD EXAM: PORTABLE CHEST 1 VIEW COMPARISON:  06/05/2022 FINDINGS: Single frontal view of the chest demonstrates a stable cardiac silhouette. Stable ectasia and atherosclerosis of the thoracic aorta. Background pleural and parenchymal scarring again noted consistent with history of emphysema. No acute airspace disease, effusion, or pneumothorax. The calcified bilateral breast prostheses. Stable right shoulder arthroplasty. IMPRESSION: 1. Neck on scarring and emphysema.  No acute airspace disease. Electronically Signed   By: Sharlet Salina M.D.   On: 05/09/2023 14:22    Microbiology: Results for orders placed or performed during the hospital encounter of 05/09/23  SARS Coronavirus 2 by RT PCR (hospital order, performed in [facility] hospital lab) *cepheid single result test* Anterior Nasal Swab     Status: None   Collection Time: 05/09/23  1:11 PM   Specimen: Anterior Nasal Swab  Result Value Ref Range Status   SARS Coronavirus 2 by RT PCR NEGATIVE NEGATIVE Final    Comment: Performed at [facility], [address], [place], [place] [address]    Labs: CBC: Recent Labs  Lab 05/09/23 1328 05/13/23 0355  WBC 9.8 9.9  NEUTROABS 7.3  --   HGB 13.7 15.1*   HCT 44.6 46.6*  MCV 92.7 87.1  PLT 197 266   Basic Metabolic Panel: Recent Labs  Lab 05/09/23 1328 05/10/23 1025 05/13/23 0355 05/14/23 0207 05/14/23 1033 05/15/23 0426  NA 136 138 133* 136  --   --   K 4.2 4.5 4.2 4.2  --   --   CL 96* 98 98 98  --   --   CO2 25 30 25 27   --   --   GLUCOSE 102* 200* 147* 112*  --   --   BUN 20 28* 54* 56* 50* 43*  CREATININE 1.39* 1.35* 1.30* 1.16*  --   --   CALCIUM 8.7* 8.6* 9.0 8.8*  --   --    Liver Function Tests: Recent Labs  Lab 05/09/23 1328  AST 18  ALT 11  ALKPHOS 69  BILITOT 0.4  PROT 6.9  ALBUMIN 3.0*   CBG: Recent Labs  Lab 05/14/23 1240 05/14/23 1644 05/14/23 2116 05/15/23 0831 05/15/23 1139  GLUCAP 115* 156* 147* 128* 106*    Discharge time spent: 35 minutes.  Signed: Jacquelin Hawking, MD [facility] 05/15/2023

## 2023-05-15 NOTE — Progress Notes (Signed)
Initial Nutrition Assessment  DOCUMENTATION CODES:   Severe malnutrition in context of chronic illness  INTERVENTION:  Liberalize diet to regular Ensure Enlive po BID, each supplement provides 350 kcal and 20 grams of protein. MVI with minerals daily "High Calorie, High Protein Nutrition Therapy" handout added to AVS Request updated measured weight  NUTRITION DIAGNOSIS:   Severe Malnutrition related to chronic illness (COPD, restrictive lung disease, CAD, HF) as evidenced by mild fat depletion, energy intake < or equal to 75% for > or equal to 1 month, severe muscle depletion.  GOAL:   Patient will meet greater than or equal to 90% of their needs  MONITOR:   PO intake, Supplement acceptance, Labs, Weight trends, Diet advancement  REASON FOR ASSESSMENT:   Consult Diet education (new diabetes)  ASSESSMENT:   Pt admitted with cough, wheezing, SOB r/t to COPD exacerbation. PMH significant for COPD, chronic restrictive lung disease, chronic hypoxic respiratory failure, PAF, CAD s/p CABG, chronic HFrEF, HTN, CKD stage IIIa, T2DM.  Pt sitting up in chair with daughter present.   Pt lives home alone. She endorses having a chronically poor appetite. She only eats when she feels like it. She states that she wakes up around 10 am and likes to have something sweet like a muffin. During the day she drinks more beverages than she eats as this is easier for her to consume. Beverage intake includes [employer], 2 Ensure daily and water. Her daughter states that they often will bring meals for her to have and when she stays with her son, she eats a decent meal with them. They also will prepare meals for her to have at home to eat when able.   Meal completions: 5/21: 50% breakfast 5/22: 25% breakfast, 50% lunch, 50% dinner 5/23: 50% breakfast, 25% lunch, 25% dinner 5/24: 50% breakfast  Given pt's advanced age, reduced appetite/PO intake, malnutrition and increased nutrition needs,  discussed recommendation of optimizing PO intake with palatable and enjoyable foods, meal times with family or friends as much as possible and least restrictive diet to maintain optimal nutrition and quality of life. Informed pt of high calorie, high protein nutrition therapy handout added to AVS to help optimize nutritional intake when consuming less.   No updated weight on file this admission. Last documented weight was 70.8 kg on 05/09. No significant weight changes noted within the last year, weight remains between 68-70 kg.   Medications: SSI 0-9 units TID, melatonin, protonix  Labs: BUN 43, Cr 1.16, GFR 47, HgBA1c 6.5%, CBG's 106-156 x24 hours  NUTRITION - FOCUSED PHYSICAL EXAM:  Flowsheet Row Most Recent Value  Orbital Region Severe depletion  Upper Arm Region Mild depletion  Thoracic and Lumbar Region Mild depletion  Buccal Region Moderate depletion  Temple Region Severe depletion  Clavicle Bone Region Severe depletion  Clavicle and Acromion Bone Region Severe depletion  Scapular Bone Region Severe depletion  Dorsal Hand Severe depletion  Patellar Region Severe depletion  Anterior Thigh Region Severe depletion  Posterior Calf Region Mild depletion  Edema (RD Assessment) None  Hair Reviewed  Eyes Reviewed  Mouth Reviewed  Skin Reviewed  Nails Reviewed       Diet Order:   Diet Order             Diet Heart Room service appropriate? Yes; Fluid consistency: Thin; Fluid restriction: 1800 mL Fluid  Diet effective now                   EDUCATION NEEDS:  Education needs have been addressed  Skin:  Skin Assessment: Reviewed RN Assessment  Last BM:  5/22  Height:   Ht Readings from Last 1 Encounters:  04/30/23 5\' 5"  (1.651 m)    Weight:   Wt Readings from Last 1 Encounters:  04/30/23 70.8 kg   BMI:  There is no height or weight on file to calculate BMI.  Estimated Nutritional Needs:   Kcal:  1600-1800  Protein:  80-95g  Fluid:  >/=1.6L  Drusilla Kanner, RDN, LDN Clinical Nutrition

## 2023-05-15 NOTE — Discharge Instructions (Addendum)
[name],  You are in the hospital with a COPD exacerbation.  You have improved with antibiotics and steroids.  While you are here, you also had some dehydration likely secondary to some nausea and decreased oral intake in addition to a compound effect from your diuretic medications.  I recommend holding your diuretic medications until you are eating and drinking better.  I will discharge you home with a nausea medication as needed as well.  Please follow-up with your primary care physician as an outpatient.    High-Calorie, High-Protein Nutrition Therapy (2021) A high-calorie, high-protein diet has been recommended to you. Your registered dietitian nutritionist (RDN) may have recommended this diet because you are having difficulty eating enough calories throughout the day, you have lost weight, and/or you need to add protein to your diet. Sometimes you may not feel like eating, even if you know the importance of good nutrition. The recommendations in this handout can help you with the following: Regaining your strength and energy Keeping your body healthy Healing and recovering from surgery or illness and fighting infection Tips: Schedule Your Meals and Snacks Several small meals and snacks are often better tolerated and digested than large meals. Strategies Plan to eat 3 meals and 3 snacks daily. Experiment with timing meals to find out when you have a larger appetite. Appetite may be greatest in the morning after not eating all night so you may prefer to eat your larger meals and snacks in the morning and at lunch. Breakfast-type foods are often better tolerated so eat foods such as eggs, pancakes, waffles and cereal for any meal or snack. Carry snacks with you so you are prepared to eat every 2 to 3 hours. Determine what works best for you if your body's cues for feeling hungry or full are not working. Eat a small meal or snack even if you don't feel hungry. Set a timer to  remind you when it is time to eat. Take a walk before you eat (with health care provider's approval). Light or moderate physical activity can help you maintain muscle and increase your appetite. Make Eating Enjoyable Taking steps to make the experience enjoyable may help to increase your interest in eating and improve your appetite. Strategies: Eat with others whenever possible. Include your favorite foods to make meals more enjoyable. Try new foods. Save your beverage for the end of the meal so that you have more room for food before you get full. Add Calories to Your Meals and Snacks Try adding calorie-dense foods so that each bite provides more nutrition. Strategies Drink milk, chocolate milk, soy milk, or smoothies instead of low-calorie beverages such as diet drinks or water. Cook with milk or soy milk instead of water when making dishes such as hot cereal, cocoa, or pudding. Add jelly, jam, honey, butter or margarine to bread and crackers. Add jam or fruit to ice cream and as a topping over cake. Mix dried fruit, nuts, granola, honey, or dry cereal with yogurt or hot cereals. Enjoy snacks such as milkshakes, smoothies, pudding, ice cream, or custard. Blend a fruit smoothie of a banana, frozen berries, milk or soy milk, and 1 tablespoon nonfat powdered milk or protein powder. Add Protein to Your Meals and Snacks Choose at least one protein food at each meal and snack to increase your daily intake. Strategies Add  cup nonfat dry milk powder or protein powder to make a high-protein milk to drink or to use in recipes that call for milk.  Vanilla or peppermint extract or unsweetened cocoa powder could help to boost the flavor. Add hard-cooked eggs, leftover meat, grated cheese, canned beans or tofu to noodles, rice, salads, sandwiches, soups, casseroles, pasta, tuna and other mixed dishes. Add powdered milk or protein powder to hot cereals, meatloaf, casseroles, scrambled eggs, sauces, cream  soups, and shakes. Add beans and lentils to salads, soups, casseroles, and vegetable dishes. Eat cottage cheese or yogurt, especially Greek yogurt, with fruit as a snack or dessert. Eat peanut or other nut butters on crackers, bread, toast, waffles, apples, bananas or celery sticks. Add it to milkshakes, smoothies, or desserts. Consider a ready-made protein shake. Your RDN will make recommendations. Add Fats to Your Meals and Snacks Try adding fats to your meals and snacks. Fat provides more calories in fewer bites than carbohydrate or protein and adds flavors to your foods. Strategies Snack on nuts and seeds or add them to foods like salads, pasta, cereals, yogurt, and ice cream.  Saut or stir-fry vegetables, meats, chicken, fish or tofu in olive or canola oil.  Add olive oil, other vegetable oils, butter or margarine to soups, vegetables, potatoes, cooked cereal, rice, pasta, bread, crackers, pancakes, or waffles. Snack on olives or add to pasta, pizza, or salad. Add avocado or guacamole to your salads, sandwiches, and other entrees. Include fatty fish such as salmon in your weekly meal plan. For general food safety tips, especially for clients with immunocompromised conditions, ask your RDN for the Food Safety Nutrition Therapy handout. Small Meal and Snack Ideas These snacks and meals are recommended when you have to eat but aren't necessarily hungry.  They are good choices because they are high in protein and high in calories.  2 graham crackers 2 tablespoons peanut or other nut butter 1 cup milk 2 slices whole wheat toast topped with:  avocado, mashed Seasoning of your choice   cup Greek yogurt  cup fruit  cup granola 2 deviled egg halves 5 whole wheat crackers  1 cup cream of tomato soup  grilled cheese sandwich 1 toasted waffle topped with: 2 tablespoons peanut or nut butter 1 tablespoon jam  Trail mix made with:  cup nuts  cup dried fruit  cup cold cereal, any  variety  cup oatmeal or cream of wheat cereal 1 tablespoon peanut or nut butter  cup diced fruit   High-Calorie, High-Protein Sample 1-Day Menu View Nutrient Info Breakfast 1 egg, scrambled 1 ounce cheddar cheese 1 English muffin, whole wheat 1 tablespoon margarine 1 tablespoon jam  cup orange juice, fortified with calcium and vitamin D  Morning Snack 1 tablespoon peanut butter 1 banana 1 cup 1% milk  Lunch Tuna salad sandwich made with: 2 slices bread, whole wheat 3 ounces tuna mixed with: 1 tablespoon mayonnaise  cup pudding  Afternoon Snack  cup hummus  cup carrots 1 pita  Evening Meal Enchilada casserole made with: 2 corn tortillas 3 ounces ground beef, cooked  cup black beans, cooked  cup corn, cooked 1 ounce grated cheddar cheese  cup enchilada sauce  avocado, sliced, topping for enchilada 1 tablespoon sour cream, topping for enchilada Salad:  cup lettuce, shredded  cup tomatoes, chopped, for salad 1 tablespoon olive oil and vinegar dressing, for salad  Evening Snack  cup Greek yogurt  cup blueberries  cup granola

## 2023-05-19 DIAGNOSIS — J9621 Acute and chronic respiratory failure with hypoxia: Secondary | ICD-10-CM | POA: Diagnosis not present

## 2023-05-19 DIAGNOSIS — I5022 Chronic systolic (congestive) heart failure: Secondary | ICD-10-CM | POA: Diagnosis not present

## 2023-05-19 DIAGNOSIS — J441 Chronic obstructive pulmonary disease with (acute) exacerbation: Secondary | ICD-10-CM | POA: Diagnosis not present

## 2023-05-19 DIAGNOSIS — J439 Emphysema, unspecified: Secondary | ICD-10-CM | POA: Diagnosis not present

## 2023-05-19 DIAGNOSIS — I5033 Acute on chronic diastolic (congestive) heart failure: Secondary | ICD-10-CM | POA: Diagnosis not present

## 2023-05-19 DIAGNOSIS — I130 Hypertensive heart and chronic kidney disease with heart failure and stage 1 through stage 4 chronic kidney disease, or unspecified chronic kidney disease: Secondary | ICD-10-CM | POA: Diagnosis not present

## 2023-05-19 DIAGNOSIS — E1122 Type 2 diabetes mellitus with diabetic chronic kidney disease: Secondary | ICD-10-CM | POA: Diagnosis not present

## 2023-05-19 DIAGNOSIS — N1832 Chronic kidney disease, stage 3b: Secondary | ICD-10-CM | POA: Diagnosis not present

## 2023-05-19 DIAGNOSIS — E1151 Type 2 diabetes mellitus with diabetic peripheral angiopathy without gangrene: Secondary | ICD-10-CM | POA: Diagnosis not present

## 2023-05-20 ENCOUNTER — Encounter: Payer: Self-pay | Admitting: Internal Medicine

## 2023-05-20 ENCOUNTER — Ambulatory Visit: Payer: Medicare HMO | Admitting: Internal Medicine

## 2023-05-20 VITALS — BP 118/72 | HR 67 | Temp 97.9°F | Resp 22 | Ht 67.0 in | Wt 154.2 lb

## 2023-05-20 DIAGNOSIS — J441 Chronic obstructive pulmonary disease with (acute) exacerbation: Secondary | ICD-10-CM | POA: Insufficient documentation

## 2023-05-20 DIAGNOSIS — E1122 Type 2 diabetes mellitus with diabetic chronic kidney disease: Secondary | ICD-10-CM | POA: Diagnosis not present

## 2023-05-20 DIAGNOSIS — N1832 Chronic kidney disease, stage 3b: Secondary | ICD-10-CM | POA: Diagnosis not present

## 2023-05-20 DIAGNOSIS — T148XXA Other injury of unspecified body region, initial encounter: Secondary | ICD-10-CM

## 2023-05-20 DIAGNOSIS — J40 Bronchitis, not specified as acute or chronic: Secondary | ICD-10-CM | POA: Diagnosis not present

## 2023-05-20 DIAGNOSIS — J9621 Acute and chronic respiratory failure with hypoxia: Secondary | ICD-10-CM

## 2023-05-20 DIAGNOSIS — I5033 Acute on chronic diastolic (congestive) heart failure: Secondary | ICD-10-CM | POA: Diagnosis not present

## 2023-05-20 DIAGNOSIS — I5022 Chronic systolic (congestive) heart failure: Secondary | ICD-10-CM | POA: Diagnosis not present

## 2023-05-20 DIAGNOSIS — I130 Hypertensive heart and chronic kidney disease with heart failure and stage 1 through stage 4 chronic kidney disease, or unspecified chronic kidney disease: Secondary | ICD-10-CM | POA: Diagnosis not present

## 2023-05-20 DIAGNOSIS — E1151 Type 2 diabetes mellitus with diabetic peripheral angiopathy without gangrene: Secondary | ICD-10-CM | POA: Diagnosis not present

## 2023-05-20 DIAGNOSIS — J439 Emphysema, unspecified: Secondary | ICD-10-CM | POA: Diagnosis not present

## 2023-05-20 MED ORDER — LEVOFLOXACIN 750 MG PO TABS: 750.0000 mg | ORAL_TABLET | ORAL | 0 refills | Status: DC

## 2023-05-20 MED ORDER — IPRATROPIUM-ALBUTEROL 0.5-2.5 (3) MG/3ML IN SOLN
3.0000 mL | Freq: Once | RESPIRATORY_TRACT | Status: AC
Start: 2023-05-20 — End: 2023-05-20
  Administered 2023-05-20: 3 mL via RESPIRATORY_TRACT

## 2023-05-20 MED ORDER — TRELEGY ELLIPTA 200-62.5-25 MCG/ACT IN AEPB: 1.0000 | INHALATION_SPRAY | Freq: Every day | RESPIRATORY_TRACT | 5 refills | Status: DC

## 2023-05-20 MED ORDER — METHYLPREDNISOLONE SODIUM SUCC 40 MG IJ SOLR
80.0000 mg | Freq: Once | INTRAMUSCULAR | Status: AC
Start: 2023-05-20 — End: 2023-05-20
  Administered 2023-05-20: 80 mg via INTRAMUSCULAR

## 2023-05-20 MED ORDER — PREDNISONE 10 MG PO TABS: ORAL_TABLET | ORAL | 0 refills | Status: DC

## 2023-05-20 NOTE — Assessment & Plan Note (Signed)
She has a DTI to her bilateral buttocks right at the top crease of her buttocks that she states started 6 weeks ago.  I want her to start calmoseptine and homehealth PT is already involved with her where I will get wound care to come assess her as well.

## 2023-05-20 NOTE — Progress Notes (Signed)
Office Visit  Subjective   Patient ID: [name]   DOB: September 18, 1941   Age: 82 y.o.   MRN: [id number]   Chief Complaint Chief Complaint  Patient presents with   Hospitalization Follow-up     History of Present Illness [name] is a 82 yo female who comes in today for a hospital followup where she was admitted to [facility] from 05/09/2023 until 05/15/2023 for an acute COPD exacerbation.  Her last hospitalization for COPD was in 04/2021 where she was doing well but on the 5/18 which was a Saturday, she began having acute SOB and cough where she was brought in to the ER.  Her initial CXR showed no acute findings.  She was admitted and started on IV steriods, antibiotics, as well as duonebs.  They transitioned her to oral prednisone and doxycycline which she completed before discharge.  She had acute on chronic hypoxic respiratory failure but thankfully she did not require more oxygen than her normal baseline of 4L oxygen via [place].  They able to wean her to 3L of oxygen before discharge.  They noted during her hospitalization that her BUN was elevated.  The etiology was unclear and could be related to developing ARF from diuresis.  Her BUN was trending down prior to discharge.  Recommendation to hold diuretics until patient has improved her oral intake.  She has restarted back on her diuretics.    Again, the patient has a history of moderately severe COPD of years duration and presents today for a status visit.  The patient has chronic respiratory failure where she was using oxygen at 4L via [place] at night time but she has been doing well with her trelegy.  Today, she states she does not use her oxygen much at night anymore.  She did see her pulmonologist in 07/2021 where they started her on trelegy.  She is on Combivent as needed.  She no longer has to use a duoneb.  She would get DOE with minimal exertion but this has improved greatly on trelegy.   However, her insurance would not cover trelegy and  she was placed on advair.  She also has a history of OSA and is supposed to be on CPAP at night but she states she does not use this.  She is no longer on daliresp anymore.  See PMH for summary of pulmonary history and lab reports for status of any previous PFTs: Congestive Heart Failure, COPD, and Obstructive Sleep Apnea. Comorbid conditions : CHF. She has the following baseline symptoms: exertional dyspnea. The exertional dyspnea is brought on by the walking 40-50 feet but there has been no change this past year. The patient's condition is controlled by medications as summarized in the medication list on the face sheet. She has the following modifiable risk factors: CHF, sedentary lifestyle, and obesity. Specifically denied complaints: worsening exertional dyspnea, increased wheezing, productive cough, change in color of sputum, pleuritic chest pain, hemoptysis, fever, and syncope.   She does have a history of OSA and is supposed to be on CPAP at night but she states she does not use this.        Past Medical History Past Medical History:  Diagnosis Date   Anemia    bld. transfusion post lumbar surgery- 2012   Anxiety    Arthralgia    NOS   CAD (coronary artery disease)    s/p CABG 2004; s/p DES to LM in 2010;  LHC 10/29/11: EF 50-55%, mild elevated  filling pressures, no pulmonary HTN, LM 90% ISR, LAD and CFX occluded, S-RI occluded (old), S-OM3 ok and L-LAD ok, native nondominant RCA 95% -  med rx recommended ; Lexiscan Myoview 7/13 at [facility]: demonstrated "normal LV function, anterior attenuation and localized ischemia, inferior, basilar, mid section"   Carotid artery disease (HCC)    Carotid US 6/19:  RICA 1-39; LICA 40-59; R subclavian stenosis - Repeat 1 year. // Carotid US 05/2019: R 1-39; L 40-59; R vertebral with atypical antegrade flow; R subclavian stenosis >> repeat 1 year // Carotid US 7/21: Bilat 40-59; L subclavian stenosis // Carotid US 7/22: Bilat ICA 40-59; R subclavian  stenosis   CHF (congestive heart failure) (HCC) 01/21/2020   Chronic diastolic heart failure (HCC)    Echo 9/10: EF 60-55%, grade 1 diastolic dysfunction   Chronic lower back pain    Chronic respiratory failure (HCC)    Cirrhosis (HCC)    CKD (chronic kidney disease), stage III (HCC)    COPD (chronic obstructive pulmonary disease) (HCC)    Emphysema dxed by Dr. Sherril Croon in [place] based on PFTs per pt in 2006; placed on albuterol   Degenerative disc disease, lumbar    Depression    TAKES CELEXA  AND  (OFF- WELBUTRIN)   Diuretic-induced hypokalemia 10/08/2018   DVT of lower extremity (deep venous thrombosis) (HCC)    recurrent. bilateral (2 episodes)   Dyspnea    home o2 when needed   Dysrhythmia    afib with cabg   Exertional angina    Treated with Isosorbide, Ranexa, amlodipine; intolerant to metoprolol   GERD (gastroesophageal reflux disease)    Gout    "on daily RX" (10/06/2018)   HLD (hyperlipidemia)    Hyperlipidemia    Hypertension    Hypothyroidism    L Subclavian Artery Stenosis    Carotid US 7/21    Obesity (BMI 30-39.9) 2009   BMI 33   On home oxygen therapy    OSA (obstructive sleep apnea)    Osteoarthritis    "all over; hands" (10/06/2018)   Osteoporosis    Oxygen dependent    4L at home as needed (10/06/2018)   PVD (peripheral vascular disease) (HCC)    Restless leg syndrome    Vitamin D deficiency      Allergies Allergies  Allergen Reactions   Ativan [Lorazepam] Other (See Comments)    Confusion    Pitavastatin Itching and Other (See Comments)    Reaction to Livalo   Ropinirole Nausea Only and Other (See Comments)    Makes legs jump, also   Zofran [Ondansetron Hcl] Nausea Only   Zolpidem Tartrate Anxiety and Other (See Comments)    CONFUSION    Zolpidem Tartrate Swelling    CONFUSION   Penicillins Rash and Other (See Comments)    Tolerated cephalosporins in past Has patient had a PCN reaction causing immediate rash, facial/tongue/throat  swelling, SOB or lightheadedness with hypotension: Yes Has patient had a PCN reaction causing severe rash involving mucus membranes or skin necrosis: No Has patient had a PCN reaction that required hospitalization: No Has patient had a PCN reaction occurring within the last 10 years: No If all of the above answers are "NO", then may proceed with Cephalosporin use.      Medications  Current Outpatient Medications:    acetaminophen (TYLENOL) 500 MG tablet, Take 1,000 mg by mouth daily., Disp: , Rfl:    albuterol (PROVENTIL) (2.5 MG/3ML) 0.083% nebulizer solution, Take 2.5 mg by nebulization  every 6 (six) hours as needed for wheezing or shortness of breath., Disp: , Rfl:    allopurinol (ZYLOPRIM) 100 MG tablet, TAKE 1 TABLET BY MOUTH DAILY WITH BREAKFAST., Disp: 90 tablet, Rfl: 0   amLODipine (NORVASC) 2.5 MG tablet, Take 1/2 tablet as needed for BP greater than 160., Disp: 30 tablet, Rfl: 1   ARIPiprazole (ABILIFY) 2 MG tablet, TAKE 1 TABLET BY MOUTH ONCE DAILY, Disp: 90 tablet, Rfl: 0   aspirin EC 81 MG tablet, Take 81 mg by mouth daily with breakfast. Swallow whole., Disp: , Rfl:    atorvastatin (LIPITOR) 20 MG tablet, TAKE 1 TABLET ONCE DAILY AT BEDTIME (Patient taking differently: Take 20 mg by mouth daily.), Disp: 90 tablet, Rfl: 0   benzonatate (TESSALON PERLES) 100 MG capsule, Take 1 capsule (100 mg total) by mouth 3 (three) times daily as needed for up to 5 days for cough., Disp: 15 capsule, Rfl: 0   cholecalciferol (VITAMIN D3) 25 MCG (1000 UNIT) tablet, Take 1 tablet (1,000 Units total) by mouth daily with supper., Disp: 90 tablet, Rfl: 1   citalopram (CELEXA) 10 MG tablet, TAKE 1 TABLET BY MOUTH ONCE DAILY, Disp: 90 tablet, Rfl: 0   cyclobenzaprine (FLEXERIL) 10 MG tablet, Take 10 mg by mouth 3 (three) times daily as needed for muscle spasms., Disp: , Rfl:    D3-1000 25 MCG (1000 UT) capsule, TAKE 1 CAPSULE BY MOUTH DAILY WITH SUPPER., Disp: 90 capsule, Rfl: 0   dapagliflozin  propanediol (FARXIGA) 10 MG TABS tablet, Take 1 tablet (10 mg total) by mouth daily before breakfast., Disp: 90 tablet, Rfl: 3   Docusate Sodium (DSS) 100 MG CAPS, Take 100 mg by mouth daily as needed (constipation)., Disp: , Rfl:    [START ON 05/30/2023] donepezil (ARICEPT) 10 MG tablet, Take 1 tablet (10 mg total) by mouth at bedtime., Disp: 30 tablet, Rfl: 1   donepezil (ARICEPT) 5 MG tablet, Take 1 tablet (5 mg total) by mouth at bedtime., Disp: 30 tablet, Rfl: 0   feeding supplement (ENSURE ENLIVE / ENSURE PLUS) LIQD, Take 237 mLs by mouth 2 (two) times daily between meals., Disp: , Rfl:    ferrous sulfate 325 (65 FE) MG tablet, TAKE 1 TABLET 2 TIMES PER DAY (Patient taking differently: Take 325 mg by mouth 2 (two) times daily.), Disp: 180 tablet, Rfl: 0   fluticasone-salmeterol (ADVAIR DISKUS) 250-50 MCG/ACT AEPB, Inhale 1 puff into the lungs in the morning and at bedtime., Disp: 1 each, Rfl: 5   furosemide (LASIX) 80 MG tablet, Take 1 tablet (80 mg total) by mouth every other day. PLEASE RESUME THIS MEDICATION ONCE YOU ARE EATING AND DRINKING BETTER., Disp: , Rfl:    gabapentin (NEURONTIN) 100 MG capsule, TAKE 2 CAPSULES BY MOUTH TWICE DAILY (Patient taking differently: Take 100 mg by mouth 2 (two) times daily. Take 1 capsule by mouth in the Morning, and 2 at Night), Disp: 360 capsule, Rfl: 2   guaiFENesin (MUCINEX) 600 MG 12 hr tablet, Take 1 tablet (600 mg total) by mouth 2 (two) times daily for 5 days., Disp: 10 tablet, Rfl: 0   Ipratropium-Albuterol (COMBIVENT RESPIMAT) 20-100 MCG/ACT AERS respimat, Inhale 1 puff into the lungs 3 (three) times daily as needed for wheezing or shortness of breath., Disp: , Rfl:    levothyroxine (SYNTHROID) 50 MCG tablet, TAKE 1 TABLET BY MOUTH DAILY BEFORE BREAKFAST., Disp: 90 tablet, Rfl: 3   Magnesium Oxide -Mg Supplement 500 MG CAPS, TAKE 1 CAPSULE BY MOUTH DAILY, Disp:  90 capsule, Rfl: 0   Melatonin 10 MG TABS, Take 10 mg by mouth at bedtime., Disp: , Rfl:     metolazone (ZAROXOLYN) 2.5 MG tablet, Take 1 tablet (2.5 mg total) by mouth once a week. Take 30 minutes before Lasix (furosemide). PLEASE RESUME THIS MEDICATION ONCE YOU ARE EATING AND DRINKING BETTER., Disp: 30 tablet, Rfl: 0   metoprolol tartrate (LOPRESSOR) 25 MG tablet, TAKE 1/2 TABLET BY MOUTH TWICE DAILY, Disp: 15 tablet, Rfl: 2   Multiple Vitamin (MULTIVITAMIN WITH MINERALS) TABS tablet, Take 1 tablet by mouth daily., Disp: , Rfl:    nitroGLYCERIN (NITROSTAT) 0.4 MG SL tablet, Place 1 tablet (0.4 mg total) under the tongue every 5 (five) minutes as needed for chest pain. For chest pain, Disp: 25 tablet, Rfl: 3   OXYGEN, Inhale 4 L/min into the lungs as needed (DURING ALL TIMES OF EXERTION). , Disp: , Rfl:    pantoprazole (PROTONIX) 40 MG tablet, TAKE 1 TABLET ONCE DAILY, Disp: 90 tablet, Rfl: 2   pilocarpine (PILOCAR) 1 % ophthalmic solution, Place 1 drop into both eyes 2 (two) times daily., Disp: , Rfl:    polyethylene glycol (MIRALAX / GLYCOLAX) 17 g packet, Take 17 g by mouth as needed., Disp: , Rfl:    potassium chloride SA (KLOR-CON M) 20 MEQ tablet, Take 1 tablet (20 mEq total) by mouth daily. Break in half. PLEASE USE THIS MEDICATION WHEN TAKING LASIX., Disp: , Rfl:    pramipexole (MIRAPEX) 0.25 MG tablet, TAKE 1 TABLET 2-3 HOURS BEFORE BEDTIME (Patient taking differently: Take 0.25 mg by mouth at bedtime. Take 1 tablet by mouth 2-3 hours before bedtime), Disp: 90 tablet, Rfl: 0   roflumilast (DALIRESP) 500 MCG TABS tablet, TAKE 1 TABLET ONCE DAILY, Disp: 90 tablet, Rfl: 0   spironolactone (ALDACTONE) 25 MG tablet, Take 1 tablet (25 mg total) by mouth daily. PLEASE RESUME THIS MEDICATION ONCE YOU ARE EATING AND DRINKING BETTER., Disp: 90 tablet, Rfl: 2   traZODone (DESYREL) 50 MG tablet, TAKE 3 TABLETS EVERY NIGHT AT BEDTIME. (Patient taking differently: Take 150 tablets by mouth at bedtime.), Disp: 270 tablet, Rfl: 0   Triamcinolone Acetonide (TRIAMCINOLONE 0.1 % CREAM : EUCERIN) CREA,  Apply 1 Application topically 2 (two) times daily for 7 days., Disp: , Rfl:    vitamin C (ASCORBIC ACID) 500 MG tablet, Take 500 mg by mouth daily with lunch., Disp: , Rfl:    XTAMPZA ER 9 MG C12A, Take 9 mg by mouth in the morning, at noon, and at bedtime., Disp: 90 capsule, Rfl: 0   Review of Systems Review of Systems  Constitutional:  Negative for chills and fever.  Eyes:  Negative for blurred vision and double vision.  Respiratory:  Positive for cough, shortness of breath and wheezing.   Cardiovascular:  Negative for chest pain, palpitations and leg swelling.  Gastrointestinal:  Negative for abdominal pain, constipation, diarrhea, nausea and vomiting.  Musculoskeletal:  Negative for myalgias.  Neurological:  Negative for dizziness, weakness and headaches.       Objective:    Vitals BP 118/72 (BP Location: Right Arm, Patient Position: Sitting, Cuff Size: Normal)   Pulse 67   Temp 97.9 F (36.6 C) (Temporal)   Resp (!) 22   Ht 5\' 7"  (1.702 m)   Wt 154 lb 3.2 oz (69.9 kg)   SpO2 95%   PF (!) 3 L/min   BMI 24.15 kg/m    Physical Examination Physical Exam Constitutional:      Appearance:  Normal appearance. She is not ill-appearing.  Cardiovascular:     Rate and Rhythm: Normal rate and regular rhythm.     Pulses: Normal pulses.     Heart sounds: No murmur heard.    No friction rub. No gallop.  Pulmonary:     Effort: Pulmonary effort is normal. No respiratory distress.     Breath sounds: Wheezing and rales present. No rhonchi.  Abdominal:     General: Bowel sounds are normal. There is no distension.     Palpations: Abdomen is soft.     Tenderness: There is no abdominal tenderness.  Musculoskeletal:     Right lower leg: No edema.     Left lower leg: No edema.  Skin:    General: Skin is warm and dry.     Findings: No rash.  Neurological:     Mental Status: She is alert.        Assessment & Plan:   Acute on chronic respiratory failure with hypoxia (HCC) She is  on 3L of oxygen via [place].  She has a current COPD exacerbation at this time with bronchtiis as above.  Acute exacerbation of chronic obstructive pulmonary disease (COPD) (HCC) She is coughing up a lot of yellow mucus and is wheezing on my exam today.  The patient has failed her advair since she got admitted and she states she does a lot better with trelegy.  However, I am going to put her on levaquin and start her on a tapering course of steriods and I want her to take her albuterol nebs scheduled every 6 hours for the next 3 days.  Today in clinic, we gave her duoneb x 1 and solumedrol IM 80mg  x 1.    Bronchitis Plan as above.  Deep tissue injury She has a DTI to her bilateral buttocks right at the top crease of her buttocks that she states started 6 weeks ago.  I want her to start calmoseptine and homehealth PT is already involved with her where I will get wound care to come assess her as well.    No follow-ups on file.   Crist Fat, MD

## 2023-05-20 NOTE — Assessment & Plan Note (Signed)
She is coughing up a lot of yellow mucus and is wheezing on my exam today.  The patient has failed her advair since she got admitted and she states she does a lot better with trelegy.  However, I am going to put her on levaquin and start her on a tapering course of steriods and I want her to take her albuterol nebs scheduled every 6 hours for the next 3 days.  Today in clinic, we gave her duoneb x 1 and solumedrol IM 80mg  x 1.

## 2023-05-20 NOTE — Assessment & Plan Note (Signed)
Plan as above.  

## 2023-05-20 NOTE — Assessment & Plan Note (Signed)
She is on 3L of oxygen via [place].  She has a current COPD exacerbation at this time with bronchtiis as above.

## 2023-05-21 ENCOUNTER — Other Ambulatory Visit: Payer: Self-pay | Admitting: Internal Medicine

## 2023-05-26 DIAGNOSIS — J9621 Acute and chronic respiratory failure with hypoxia: Secondary | ICD-10-CM | POA: Diagnosis not present

## 2023-05-26 DIAGNOSIS — J439 Emphysema, unspecified: Secondary | ICD-10-CM | POA: Diagnosis not present

## 2023-05-26 DIAGNOSIS — E1151 Type 2 diabetes mellitus with diabetic peripheral angiopathy without gangrene: Secondary | ICD-10-CM | POA: Diagnosis not present

## 2023-05-26 DIAGNOSIS — I5033 Acute on chronic diastolic (congestive) heart failure: Secondary | ICD-10-CM | POA: Diagnosis not present

## 2023-05-26 DIAGNOSIS — I130 Hypertensive heart and chronic kidney disease with heart failure and stage 1 through stage 4 chronic kidney disease, or unspecified chronic kidney disease: Secondary | ICD-10-CM | POA: Diagnosis not present

## 2023-05-26 DIAGNOSIS — N1832 Chronic kidney disease, stage 3b: Secondary | ICD-10-CM | POA: Diagnosis not present

## 2023-05-26 DIAGNOSIS — I5022 Chronic systolic (congestive) heart failure: Secondary | ICD-10-CM | POA: Diagnosis not present

## 2023-05-26 DIAGNOSIS — E1122 Type 2 diabetes mellitus with diabetic chronic kidney disease: Secondary | ICD-10-CM | POA: Diagnosis not present

## 2023-05-26 DIAGNOSIS — J441 Chronic obstructive pulmonary disease with (acute) exacerbation: Secondary | ICD-10-CM | POA: Diagnosis not present

## 2023-05-27 DIAGNOSIS — J9621 Acute and chronic respiratory failure with hypoxia: Secondary | ICD-10-CM | POA: Diagnosis not present

## 2023-05-27 DIAGNOSIS — I130 Hypertensive heart and chronic kidney disease with heart failure and stage 1 through stage 4 chronic kidney disease, or unspecified chronic kidney disease: Secondary | ICD-10-CM | POA: Diagnosis not present

## 2023-05-27 DIAGNOSIS — J439 Emphysema, unspecified: Secondary | ICD-10-CM | POA: Diagnosis not present

## 2023-05-27 DIAGNOSIS — E1122 Type 2 diabetes mellitus with diabetic chronic kidney disease: Secondary | ICD-10-CM | POA: Diagnosis not present

## 2023-05-27 DIAGNOSIS — I5022 Chronic systolic (congestive) heart failure: Secondary | ICD-10-CM | POA: Diagnosis not present

## 2023-05-27 DIAGNOSIS — E1151 Type 2 diabetes mellitus with diabetic peripheral angiopathy without gangrene: Secondary | ICD-10-CM | POA: Diagnosis not present

## 2023-05-27 DIAGNOSIS — I5033 Acute on chronic diastolic (congestive) heart failure: Secondary | ICD-10-CM | POA: Diagnosis not present

## 2023-05-27 DIAGNOSIS — N1832 Chronic kidney disease, stage 3b: Secondary | ICD-10-CM | POA: Diagnosis not present

## 2023-05-27 DIAGNOSIS — J441 Chronic obstructive pulmonary disease with (acute) exacerbation: Secondary | ICD-10-CM | POA: Diagnosis not present

## 2023-05-28 ENCOUNTER — Encounter: Payer: Self-pay | Admitting: Internal Medicine

## 2023-05-28 ENCOUNTER — Ambulatory Visit: Payer: Medicare HMO | Admitting: Internal Medicine

## 2023-05-28 VITALS — BP 148/80 | HR 70 | Temp 97.0°F | Resp 16 | Ht 65.0 in | Wt 153.2 lb

## 2023-05-28 DIAGNOSIS — E1151 Type 2 diabetes mellitus with diabetic peripheral angiopathy without gangrene: Secondary | ICD-10-CM | POA: Diagnosis not present

## 2023-05-28 DIAGNOSIS — B370 Candidal stomatitis: Secondary | ICD-10-CM | POA: Diagnosis not present

## 2023-05-28 DIAGNOSIS — I130 Hypertensive heart and chronic kidney disease with heart failure and stage 1 through stage 4 chronic kidney disease, or unspecified chronic kidney disease: Secondary | ICD-10-CM | POA: Diagnosis not present

## 2023-05-28 DIAGNOSIS — J439 Emphysema, unspecified: Secondary | ICD-10-CM | POA: Diagnosis not present

## 2023-05-28 DIAGNOSIS — N1832 Chronic kidney disease, stage 3b: Secondary | ICD-10-CM | POA: Diagnosis not present

## 2023-05-28 DIAGNOSIS — I5022 Chronic systolic (congestive) heart failure: Secondary | ICD-10-CM | POA: Diagnosis not present

## 2023-05-28 DIAGNOSIS — J9621 Acute and chronic respiratory failure with hypoxia: Secondary | ICD-10-CM | POA: Diagnosis not present

## 2023-05-28 DIAGNOSIS — I5033 Acute on chronic diastolic (congestive) heart failure: Secondary | ICD-10-CM | POA: Diagnosis not present

## 2023-05-28 DIAGNOSIS — J441 Chronic obstructive pulmonary disease with (acute) exacerbation: Secondary | ICD-10-CM | POA: Diagnosis not present

## 2023-05-28 DIAGNOSIS — E1122 Type 2 diabetes mellitus with diabetic chronic kidney disease: Secondary | ICD-10-CM | POA: Diagnosis not present

## 2023-05-28 MED ORDER — FLUCONAZOLE 150 MG PO TABS: 150.0000 mg | ORAL_TABLET | ORAL | 0 refills | Status: DC

## 2023-05-28 NOTE — Assessment & Plan Note (Signed)
She has thrush but no history of diabetes.  I am going to recheck her HgBA1c when she comes back in 07/2023 for her annual wellness.  I asked her to continue to rinse her mouth out after neb and inhaler treatments.  She has Stage III CKD and we will give her diflucan for a week but give it to her every other day.

## 2023-05-28 NOTE — Progress Notes (Signed)
Office Visit  Subjective   Patient ID: [name]   DOB: 04-30-1941   Age: 82 y.o.   MRN: [id number]   Chief Complaint Chief Complaint  Patient presents with   Acute Visit    White spots in mouth     History of Present Illness [name] is a 82 yo female who comes in today for an acute visit where about 9 days ago she developed thrush.  She is seeing a white plaque on her tongue.  She was recently admitted to the hospital for a COPD exacerbation where her breathing has done a lot better.  She does not have a history of diabetes but she is using a nebulizer at home and her inhalers.  She states she does wash her mouth out with water whenever she uses these modalities.  She is having some irritation with swallowing but she states she is eating and drinking well.  Last year on her yearly exam in 05/12/2022, we did check her HgBA1c and it was 5.8%.  They did check it in the hospital while she was on steriods and it was up to 6.5%.     Past Medical History Past Medical History:  Diagnosis Date   Anemia    bld. transfusion post lumbar surgery- 2012   Anxiety    Arthralgia    NOS   CAD (coronary artery disease)    s/p CABG 2004; s/p DES to LM in 2010;  LHC 10/29/11: EF 50-55%, mild elevated filling pressures, no pulmonary HTN, LM 90% ISR, LAD and CFX occluded, S-RI occluded (old), S-OM3 ok and L-LAD ok, native nondominant RCA 95% -  med rx recommended ; Lexiscan Myoview 7/13 at [facility]: demonstrated "normal LV function, anterior attenuation and localized ischemia, inferior, basilar, mid section"   Carotid artery disease (HCC)    Carotid US 6/19:  RICA 1-39; LICA 40-59; R subclavian stenosis - Repeat 1 year. // Carotid US 05/2019: R 1-39; L 40-59; R vertebral with atypical antegrade flow; R subclavian stenosis >> repeat 1 year // Carotid US 7/21: Bilat 40-59; L subclavian stenosis // Carotid US 7/22: Bilat ICA 40-59; R subclavian stenosis   CHF (congestive heart failure) (HCC)  01/21/2020   Chronic diastolic heart failure (HCC)    Echo 9/10: EF 60-55%, grade 1 diastolic dysfunction   Chronic lower back pain    Chronic respiratory failure (HCC)    Cirrhosis (HCC)    CKD (chronic kidney disease), stage III (HCC)    COPD (chronic obstructive pulmonary disease) (HCC)    Emphysema dxed by Dr. Sherril Croon in [place] based on PFTs per pt in 2006; placed on albuterol   Degenerative disc disease, lumbar    Depression    TAKES CELEXA  AND  (OFF- WELBUTRIN)   Diuretic-induced hypokalemia 10/08/2018   DVT of lower extremity (deep venous thrombosis) (HCC)    recurrent. bilateral (2 episodes)   Dyspnea    home o2 when needed   Dysrhythmia    afib with cabg   Exertional angina    Treated with Isosorbide, Ranexa, amlodipine; intolerant to metoprolol   GERD (gastroesophageal reflux disease)    Gout    "on daily RX" (10/06/2018)   HLD (hyperlipidemia)    Hyperlipidemia    Hypertension    Hypothyroidism    L Subclavian Artery Stenosis    Carotid US 7/21    Obesity (BMI 30-39.9) 2009   BMI 33   On home oxygen therapy    OSA (obstructive sleep  apnea)    Osteoarthritis    "all over; hands" (10/06/2018)   Osteoporosis    Oxygen dependent    4L at home as needed (10/06/2018)   PVD (peripheral vascular disease) (HCC)    Restless leg syndrome    Vitamin D deficiency      Allergies Allergies  Allergen Reactions   Ativan [Lorazepam] Other (See Comments)    Confusion    Pitavastatin Itching and Other (See Comments)    Reaction to Livalo   Ropinirole Nausea Only and Other (See Comments)    Makes legs jump, also   Zofran [Ondansetron Hcl] Nausea Only   Zolpidem Tartrate Anxiety and Other (See Comments)    CONFUSION    Zolpidem Tartrate Swelling    CONFUSION   Penicillins Rash and Other (See Comments)    Tolerated cephalosporins in past Has patient had a PCN reaction causing immediate rash, facial/tongue/throat swelling, SOB or lightheadedness with hypotension:  Yes Has patient had a PCN reaction causing severe rash involving mucus membranes or skin necrosis: No Has patient had a PCN reaction that required hospitalization: No Has patient had a PCN reaction occurring within the last 10 years: No If all of the above answers are "NO", then may proceed with Cephalosporin use.      Medications  Current Outpatient Medications:    acetaminophen (TYLENOL) 500 MG tablet, Take 1,000 mg by mouth daily., Disp: , Rfl:    albuterol (PROVENTIL) (2.5 MG/3ML) 0.083% nebulizer solution, Take 2.5 mg by nebulization every 6 (six) hours as needed for wheezing or shortness of breath., Disp: , Rfl:    allopurinol (ZYLOPRIM) 100 MG tablet, TAKE 1 TABLET BY MOUTH DAILY WITH BREAKFAST., Disp: 90 tablet, Rfl: 0   amLODipine (NORVASC) 2.5 MG tablet, Take 1/2 tablet as needed for BP greater than 160., Disp: 30 tablet, Rfl: 1   ARIPiprazole (ABILIFY) 2 MG tablet, TAKE 1 TABLET BY MOUTH ONCE DAILY, Disp: 90 tablet, Rfl: 0   aspirin EC 81 MG tablet, Take 81 mg by mouth daily with breakfast. Swallow whole., Disp: , Rfl:    atorvastatin (LIPITOR) 20 MG tablet, TAKE 1 TABLET ONCE DAILY AT BEDTIME (Patient taking differently: Take 20 mg by mouth daily.), Disp: 90 tablet, Rfl: 0   cholecalciferol (VITAMIN D3) 25 MCG (1000 UNIT) tablet, Take 1 tablet (1,000 Units total) by mouth daily with supper., Disp: 90 tablet, Rfl: 1   citalopram (CELEXA) 10 MG tablet, TAKE 1 TABLET BY MOUTH ONCE DAILY, Disp: 90 tablet, Rfl: 0   cyclobenzaprine (FLEXERIL) 10 MG tablet, Take 10 mg by mouth 3 (three) times daily as needed for muscle spasms., Disp: , Rfl:    D3-1000 25 MCG (1000 UT) capsule, TAKE 1 CAPSULE BY MOUTH DAILY WITH SUPPER., Disp: 90 capsule, Rfl: 0   dapagliflozin propanediol (FARXIGA) 10 MG TABS tablet, Take 1 tablet (10 mg total) by mouth daily before breakfast., Disp: 90 tablet, Rfl: 3   Docusate Sodium (DSS) 100 MG CAPS, Take 100 mg by mouth daily as needed (constipation)., Disp: , Rfl:     [START ON 05/30/2023] donepezil (ARICEPT) 10 MG tablet, Take 1 tablet (10 mg total) by mouth at bedtime., Disp: 30 tablet, Rfl: 1   donepezil (ARICEPT) 5 MG tablet, Take 1 tablet (5 mg total) by mouth at bedtime., Disp: 30 tablet, Rfl: 0   feeding supplement (ENSURE ENLIVE / ENSURE PLUS) LIQD, Take 237 mLs by mouth 2 (two) times daily between meals., Disp: , Rfl:    ferrous sulfate 325 (65  FE) MG tablet, TAKE 1 TABLET 2 TIMES PER DAY (Patient taking differently: Take 325 mg by mouth 2 (two) times daily.), Disp: 180 tablet, Rfl: 0   fluticasone-salmeterol (ADVAIR DISKUS) 250-50 MCG/ACT AEPB, Inhale 1 puff into the lungs in the morning and at bedtime., Disp: 1 each, Rfl: 5   Fluticasone-Umeclidin-Vilant (TRELEGY ELLIPTA) 200-62.5-25 MCG/ACT AEPB, Inhale 1 puff into the lungs daily., Disp: 1 each, Rfl: 5   furosemide (LASIX) 80 MG tablet, Take 1 tablet (80 mg total) by mouth every other day. PLEASE RESUME THIS MEDICATION ONCE YOU ARE EATING AND DRINKING BETTER., Disp: , Rfl:    gabapentin (NEURONTIN) 100 MG capsule, TAKE 2 CAPSULES BY MOUTH TWICE DAILY (Patient taking differently: Take 100 mg by mouth 2 (two) times daily. Take 1 capsule by mouth in the Morning, and 2 at Night), Disp: 360 capsule, Rfl: 2   Ipratropium-Albuterol (COMBIVENT RESPIMAT) 20-100 MCG/ACT AERS respimat, Inhale 1 puff into the lungs 3 (three) times daily as needed for wheezing or shortness of breath., Disp: , Rfl:    levofloxacin (LEVAQUIN) 750 MG tablet, Take 1 tablet (750 mg total) by mouth every other day., Disp: 4 tablet, Rfl: 0   levothyroxine (SYNTHROID) 50 MCG tablet, TAKE 1 TABLET BY MOUTH DAILY BEFORE BREAKFAST., Disp: 90 tablet, Rfl: 3   Magnesium Oxide -Mg Supplement 500 MG CAPS, TAKE 1 CAPSULE BY MOUTH DAILY, Disp: 90 capsule, Rfl: 0   Melatonin 10 MG TABS, Take 10 mg by mouth at bedtime., Disp: , Rfl:    metolazone (ZAROXOLYN) 2.5 MG tablet, Take 1 tablet (2.5 mg total) by mouth every 14 (fourteen) days., Disp: 30  tablet, Rfl: 5   metoprolol tartrate (LOPRESSOR) 25 MG tablet, TAKE 1/2 TABLET BY MOUTH TWICE DAILY, Disp: 15 tablet, Rfl: 2   Multiple Vitamin (MULTIVITAMIN WITH MINERALS) TABS tablet, Take 1 tablet by mouth daily., Disp: , Rfl:    nitroGLYCERIN (NITROSTAT) 0.4 MG SL tablet, Place 1 tablet (0.4 mg total) under the tongue every 5 (five) minutes as needed for chest pain. For chest pain, Disp: 25 tablet, Rfl: 3   OXYGEN, Inhale 4 L/min into the lungs as needed (DURING ALL TIMES OF EXERTION). , Disp: , Rfl:    pantoprazole (PROTONIX) 40 MG tablet, TAKE 1 TABLET ONCE DAILY, Disp: 90 tablet, Rfl: 2   pilocarpine (PILOCAR) 1 % ophthalmic solution, Place 1 drop into both eyes 2 (two) times daily., Disp: , Rfl:    polyethylene glycol (MIRALAX / GLYCOLAX) 17 g packet, Take 17 g by mouth as needed., Disp: , Rfl:    potassium chloride SA (KLOR-CON M) 20 MEQ tablet, Take 1 tablet (20 mEq total) by mouth daily. Break in half. PLEASE USE THIS MEDICATION WHEN TAKING LASIX., Disp: , Rfl:    pramipexole (MIRAPEX) 0.25 MG tablet, TAKE 1 TABLET 2-3 HOURS BEFORE BEDTIME (Patient taking differently: Take 0.25 mg by mouth at bedtime. Take 1 tablet by mouth 2-3 hours before bedtime), Disp: 90 tablet, Rfl: 0   predniSONE (DELTASONE) 10 MG tablet, Day 1-3: take 4 tabs po daily, Day 4-6: take 3 tabs po daily, Day 7-9: take 2 tabs po daily, Day 10-12: take 1 tab po daily, Disp: 30 tablet, Rfl: 0   roflumilast (DALIRESP) 500 MCG TABS tablet, TAKE 1 TABLET ONCE DAILY, Disp: 90 tablet, Rfl: 0   spironolactone (ALDACTONE) 25 MG tablet, Take 1 tablet (25 mg total) by mouth daily. PLEASE RESUME THIS MEDICATION ONCE YOU ARE EATING AND DRINKING BETTER., Disp: 90 tablet, Rfl: 2  traZODone (DESYREL) 50 MG tablet, TAKE 3 TABLETS EVERY NIGHT AT BEDTIME. (Patient taking differently: Take 150 tablets by mouth at bedtime.), Disp: 270 tablet, Rfl: 0   vitamin C (ASCORBIC ACID) 500 MG tablet, Take 500 mg by mouth daily with lunch., Disp: , Rfl:     XTAMPZA ER 9 MG C12A, Take 9 mg by mouth in the morning, at noon, and at bedtime., Disp: 90 capsule, Rfl: 0   Review of Systems Review of Systems  Eyes:  Negative for blurred vision and double vision.  Respiratory:  Negative for cough, shortness of breath and wheezing.   Cardiovascular:  Negative for chest pain, palpitations and leg swelling.  Gastrointestinal:  Positive for heartburn. Negative for abdominal pain, constipation, diarrhea, nausea and vomiting.  Genitourinary:  Negative for dysuria.  Neurological:  Negative for dizziness, weakness and headaches.       Objective:    Vitals BP (!) 148/80   Pulse 70   Temp (!) 97 F (36.1 C)   Resp 16   Ht 5\' 5"  (1.651 m)   Wt 153 lb 3.2 oz (69.5 kg)   SpO2 97%   BMI 25.49 kg/m    Physical Examination Physical Exam Constitutional:      Appearance: Normal appearance. She is not ill-appearing.  HENT:     Mouth/Throat:     Mouth: Mucous membranes are moist.     Pharynx: No oropharyngeal exudate or posterior oropharyngeal erythema.     Comments: She has white plaques on her tongue but not her palate. Cardiovascular:     Rate and Rhythm: Normal rate and regular rhythm.     Pulses: Normal pulses.     Heart sounds: No murmur heard.    No friction rub. No gallop.  Pulmonary:     Effort: Pulmonary effort is normal. No respiratory distress.     Breath sounds: No wheezing, rhonchi or rales.  Abdominal:     General: Bowel sounds are normal. There is no distension.     Palpations: Abdomen is soft.     Tenderness: There is no abdominal tenderness.  Musculoskeletal:     Right lower leg: No edema.     Left lower leg: No edema.  Skin:    General: Skin is warm and dry.     Findings: No rash.  Neurological:     Mental Status: She is alert.        Assessment & Plan:   Thrush She has thrush but no history of diabetes.  I am going to recheck her HgBA1c when she comes back in 07/2023 for her annual wellness.  I asked her to  continue to rinse her mouth out after neb and inhaler treatments.  She has Stage III CKD and we will give her diflucan for a week but give it to her every other day.    No follow-ups on file.   Crist Fat, MD

## 2023-06-02 DIAGNOSIS — J9621 Acute and chronic respiratory failure with hypoxia: Secondary | ICD-10-CM | POA: Diagnosis not present

## 2023-06-02 DIAGNOSIS — I130 Hypertensive heart and chronic kidney disease with heart failure and stage 1 through stage 4 chronic kidney disease, or unspecified chronic kidney disease: Secondary | ICD-10-CM | POA: Diagnosis not present

## 2023-06-02 DIAGNOSIS — I5022 Chronic systolic (congestive) heart failure: Secondary | ICD-10-CM | POA: Diagnosis not present

## 2023-06-02 DIAGNOSIS — J439 Emphysema, unspecified: Secondary | ICD-10-CM | POA: Diagnosis not present

## 2023-06-02 DIAGNOSIS — J441 Chronic obstructive pulmonary disease with (acute) exacerbation: Secondary | ICD-10-CM | POA: Diagnosis not present

## 2023-06-02 DIAGNOSIS — E1122 Type 2 diabetes mellitus with diabetic chronic kidney disease: Secondary | ICD-10-CM | POA: Diagnosis not present

## 2023-06-02 DIAGNOSIS — N1832 Chronic kidney disease, stage 3b: Secondary | ICD-10-CM | POA: Diagnosis not present

## 2023-06-02 DIAGNOSIS — I5033 Acute on chronic diastolic (congestive) heart failure: Secondary | ICD-10-CM | POA: Diagnosis not present

## 2023-06-02 DIAGNOSIS — E1151 Type 2 diabetes mellitus with diabetic peripheral angiopathy without gangrene: Secondary | ICD-10-CM | POA: Diagnosis not present

## 2023-06-03 DIAGNOSIS — I5033 Acute on chronic diastolic (congestive) heart failure: Secondary | ICD-10-CM | POA: Diagnosis not present

## 2023-06-03 DIAGNOSIS — J9621 Acute and chronic respiratory failure with hypoxia: Secondary | ICD-10-CM | POA: Diagnosis not present

## 2023-06-03 DIAGNOSIS — I130 Hypertensive heart and chronic kidney disease with heart failure and stage 1 through stage 4 chronic kidney disease, or unspecified chronic kidney disease: Secondary | ICD-10-CM | POA: Diagnosis not present

## 2023-06-03 DIAGNOSIS — E1122 Type 2 diabetes mellitus with diabetic chronic kidney disease: Secondary | ICD-10-CM | POA: Diagnosis not present

## 2023-06-03 DIAGNOSIS — I5022 Chronic systolic (congestive) heart failure: Secondary | ICD-10-CM | POA: Diagnosis not present

## 2023-06-03 DIAGNOSIS — N1832 Chronic kidney disease, stage 3b: Secondary | ICD-10-CM | POA: Diagnosis not present

## 2023-06-03 DIAGNOSIS — E1151 Type 2 diabetes mellitus with diabetic peripheral angiopathy without gangrene: Secondary | ICD-10-CM | POA: Diagnosis not present

## 2023-06-03 DIAGNOSIS — J441 Chronic obstructive pulmonary disease with (acute) exacerbation: Secondary | ICD-10-CM | POA: Diagnosis not present

## 2023-06-03 DIAGNOSIS — J439 Emphysema, unspecified: Secondary | ICD-10-CM | POA: Diagnosis not present

## 2023-06-05 DIAGNOSIS — J441 Chronic obstructive pulmonary disease with (acute) exacerbation: Secondary | ICD-10-CM | POA: Diagnosis not present

## 2023-06-05 DIAGNOSIS — E1151 Type 2 diabetes mellitus with diabetic peripheral angiopathy without gangrene: Secondary | ICD-10-CM | POA: Diagnosis not present

## 2023-06-05 DIAGNOSIS — I5033 Acute on chronic diastolic (congestive) heart failure: Secondary | ICD-10-CM | POA: Diagnosis not present

## 2023-06-05 DIAGNOSIS — I5022 Chronic systolic (congestive) heart failure: Secondary | ICD-10-CM | POA: Diagnosis not present

## 2023-06-05 DIAGNOSIS — J9621 Acute and chronic respiratory failure with hypoxia: Secondary | ICD-10-CM | POA: Diagnosis not present

## 2023-06-05 DIAGNOSIS — N1832 Chronic kidney disease, stage 3b: Secondary | ICD-10-CM | POA: Diagnosis not present

## 2023-06-05 DIAGNOSIS — E1122 Type 2 diabetes mellitus with diabetic chronic kidney disease: Secondary | ICD-10-CM | POA: Diagnosis not present

## 2023-06-05 DIAGNOSIS — J439 Emphysema, unspecified: Secondary | ICD-10-CM | POA: Diagnosis not present

## 2023-06-05 DIAGNOSIS — I130 Hypertensive heart and chronic kidney disease with heart failure and stage 1 through stage 4 chronic kidney disease, or unspecified chronic kidney disease: Secondary | ICD-10-CM | POA: Diagnosis not present

## 2023-06-08 DIAGNOSIS — E1122 Type 2 diabetes mellitus with diabetic chronic kidney disease: Secondary | ICD-10-CM | POA: Diagnosis not present

## 2023-06-08 DIAGNOSIS — J9621 Acute and chronic respiratory failure with hypoxia: Secondary | ICD-10-CM | POA: Diagnosis not present

## 2023-06-08 DIAGNOSIS — J441 Chronic obstructive pulmonary disease with (acute) exacerbation: Secondary | ICD-10-CM | POA: Diagnosis not present

## 2023-06-08 DIAGNOSIS — I5033 Acute on chronic diastolic (congestive) heart failure: Secondary | ICD-10-CM | POA: Diagnosis not present

## 2023-06-08 DIAGNOSIS — N1832 Chronic kidney disease, stage 3b: Secondary | ICD-10-CM | POA: Diagnosis not present

## 2023-06-08 DIAGNOSIS — I5022 Chronic systolic (congestive) heart failure: Secondary | ICD-10-CM | POA: Diagnosis not present

## 2023-06-08 DIAGNOSIS — J439 Emphysema, unspecified: Secondary | ICD-10-CM | POA: Diagnosis not present

## 2023-06-08 DIAGNOSIS — I130 Hypertensive heart and chronic kidney disease with heart failure and stage 1 through stage 4 chronic kidney disease, or unspecified chronic kidney disease: Secondary | ICD-10-CM | POA: Diagnosis not present

## 2023-06-08 DIAGNOSIS — E1151 Type 2 diabetes mellitus with diabetic peripheral angiopathy without gangrene: Secondary | ICD-10-CM | POA: Diagnosis not present

## 2023-06-09 ENCOUNTER — Other Ambulatory Visit: Payer: Self-pay | Admitting: Internal Medicine

## 2023-06-09 DIAGNOSIS — I5033 Acute on chronic diastolic (congestive) heart failure: Secondary | ICD-10-CM | POA: Diagnosis not present

## 2023-06-09 DIAGNOSIS — J439 Emphysema, unspecified: Secondary | ICD-10-CM | POA: Diagnosis not present

## 2023-06-09 DIAGNOSIS — E1122 Type 2 diabetes mellitus with diabetic chronic kidney disease: Secondary | ICD-10-CM | POA: Diagnosis not present

## 2023-06-09 DIAGNOSIS — N1832 Chronic kidney disease, stage 3b: Secondary | ICD-10-CM | POA: Diagnosis not present

## 2023-06-09 DIAGNOSIS — I130 Hypertensive heart and chronic kidney disease with heart failure and stage 1 through stage 4 chronic kidney disease, or unspecified chronic kidney disease: Secondary | ICD-10-CM | POA: Diagnosis not present

## 2023-06-09 DIAGNOSIS — E1151 Type 2 diabetes mellitus with diabetic peripheral angiopathy without gangrene: Secondary | ICD-10-CM | POA: Diagnosis not present

## 2023-06-09 DIAGNOSIS — J9621 Acute and chronic respiratory failure with hypoxia: Secondary | ICD-10-CM | POA: Diagnosis not present

## 2023-06-09 DIAGNOSIS — J441 Chronic obstructive pulmonary disease with (acute) exacerbation: Secondary | ICD-10-CM | POA: Diagnosis not present

## 2023-06-09 DIAGNOSIS — I5022 Chronic systolic (congestive) heart failure: Secondary | ICD-10-CM | POA: Diagnosis not present

## 2023-06-10 ENCOUNTER — Other Ambulatory Visit: Payer: Self-pay | Admitting: Internal Medicine

## 2023-06-10 DIAGNOSIS — Z792 Long term (current) use of antibiotics: Secondary | ICD-10-CM | POA: Diagnosis not present

## 2023-06-10 DIAGNOSIS — R9431 Abnormal electrocardiogram [ECG] [EKG]: Secondary | ICD-10-CM | POA: Diagnosis not present

## 2023-06-10 DIAGNOSIS — L03116 Cellulitis of left lower limb: Secondary | ICD-10-CM | POA: Diagnosis not present

## 2023-06-10 DIAGNOSIS — I82432 Acute embolism and thrombosis of left popliteal vein: Secondary | ICD-10-CM | POA: Diagnosis not present

## 2023-06-10 DIAGNOSIS — Z7902 Long term (current) use of antithrombotics/antiplatelets: Secondary | ICD-10-CM | POA: Diagnosis not present

## 2023-06-10 DIAGNOSIS — I444 Left anterior fascicular block: Secondary | ICD-10-CM | POA: Diagnosis not present

## 2023-06-10 DIAGNOSIS — J439 Emphysema, unspecified: Secondary | ICD-10-CM | POA: Diagnosis not present

## 2023-06-10 DIAGNOSIS — I5022 Chronic systolic (congestive) heart failure: Secondary | ICD-10-CM | POA: Diagnosis not present

## 2023-06-10 DIAGNOSIS — J441 Chronic obstructive pulmonary disease with (acute) exacerbation: Secondary | ICD-10-CM | POA: Diagnosis not present

## 2023-06-10 DIAGNOSIS — N1832 Chronic kidney disease, stage 3b: Secondary | ICD-10-CM | POA: Diagnosis not present

## 2023-06-10 DIAGNOSIS — Z7982 Long term (current) use of aspirin: Secondary | ICD-10-CM | POA: Diagnosis not present

## 2023-06-10 DIAGNOSIS — E1122 Type 2 diabetes mellitus with diabetic chronic kidney disease: Secondary | ICD-10-CM | POA: Diagnosis not present

## 2023-06-10 DIAGNOSIS — J9621 Acute and chronic respiratory failure with hypoxia: Secondary | ICD-10-CM | POA: Diagnosis not present

## 2023-06-10 DIAGNOSIS — I130 Hypertensive heart and chronic kidney disease with heart failure and stage 1 through stage 4 chronic kidney disease, or unspecified chronic kidney disease: Secondary | ICD-10-CM | POA: Diagnosis not present

## 2023-06-10 DIAGNOSIS — E1151 Type 2 diabetes mellitus with diabetic peripheral angiopathy without gangrene: Secondary | ICD-10-CM | POA: Diagnosis not present

## 2023-06-10 DIAGNOSIS — I5033 Acute on chronic diastolic (congestive) heart failure: Secondary | ICD-10-CM | POA: Diagnosis not present

## 2023-06-10 DIAGNOSIS — R079 Chest pain, unspecified: Secondary | ICD-10-CM | POA: Diagnosis not present

## 2023-06-10 DIAGNOSIS — J449 Chronic obstructive pulmonary disease, unspecified: Secondary | ICD-10-CM | POA: Diagnosis not present

## 2023-06-10 DIAGNOSIS — R0602 Shortness of breath: Secondary | ICD-10-CM | POA: Diagnosis not present

## 2023-06-11 ENCOUNTER — Other Ambulatory Visit: Payer: Self-pay | Admitting: Internal Medicine

## 2023-06-15 ENCOUNTER — Other Ambulatory Visit: Payer: Self-pay | Admitting: Internal Medicine

## 2023-06-15 DIAGNOSIS — I471 Supraventricular tachycardia, unspecified: Secondary | ICD-10-CM

## 2023-06-16 ENCOUNTER — Other Ambulatory Visit: Payer: Self-pay | Admitting: Internal Medicine

## 2023-06-16 DIAGNOSIS — J439 Emphysema, unspecified: Secondary | ICD-10-CM | POA: Diagnosis not present

## 2023-06-16 DIAGNOSIS — I5033 Acute on chronic diastolic (congestive) heart failure: Secondary | ICD-10-CM | POA: Diagnosis not present

## 2023-06-16 DIAGNOSIS — I130 Hypertensive heart and chronic kidney disease with heart failure and stage 1 through stage 4 chronic kidney disease, or unspecified chronic kidney disease: Secondary | ICD-10-CM | POA: Diagnosis not present

## 2023-06-16 DIAGNOSIS — J441 Chronic obstructive pulmonary disease with (acute) exacerbation: Secondary | ICD-10-CM | POA: Diagnosis not present

## 2023-06-16 DIAGNOSIS — N1832 Chronic kidney disease, stage 3b: Secondary | ICD-10-CM | POA: Diagnosis not present

## 2023-06-16 DIAGNOSIS — I5022 Chronic systolic (congestive) heart failure: Secondary | ICD-10-CM | POA: Diagnosis not present

## 2023-06-16 DIAGNOSIS — E1151 Type 2 diabetes mellitus with diabetic peripheral angiopathy without gangrene: Secondary | ICD-10-CM | POA: Diagnosis not present

## 2023-06-16 DIAGNOSIS — J9621 Acute and chronic respiratory failure with hypoxia: Secondary | ICD-10-CM | POA: Diagnosis not present

## 2023-06-16 DIAGNOSIS — E1122 Type 2 diabetes mellitus with diabetic chronic kidney disease: Secondary | ICD-10-CM | POA: Diagnosis not present

## 2023-06-16 MED ORDER — XTAMPZA ER 9 MG PO C12A: 9.0000 mg | EXTENDED_RELEASE_CAPSULE | Freq: Three times a day (TID) | ORAL | 0 refills | Status: DC

## 2023-06-17 DIAGNOSIS — E1151 Type 2 diabetes mellitus with diabetic peripheral angiopathy without gangrene: Secondary | ICD-10-CM | POA: Diagnosis not present

## 2023-06-17 DIAGNOSIS — I130 Hypertensive heart and chronic kidney disease with heart failure and stage 1 through stage 4 chronic kidney disease, or unspecified chronic kidney disease: Secondary | ICD-10-CM | POA: Diagnosis not present

## 2023-06-17 DIAGNOSIS — I5022 Chronic systolic (congestive) heart failure: Secondary | ICD-10-CM | POA: Diagnosis not present

## 2023-06-17 DIAGNOSIS — J441 Chronic obstructive pulmonary disease with (acute) exacerbation: Secondary | ICD-10-CM | POA: Diagnosis not present

## 2023-06-17 DIAGNOSIS — J9621 Acute and chronic respiratory failure with hypoxia: Secondary | ICD-10-CM | POA: Diagnosis not present

## 2023-06-17 DIAGNOSIS — J439 Emphysema, unspecified: Secondary | ICD-10-CM | POA: Diagnosis not present

## 2023-06-17 DIAGNOSIS — N1832 Chronic kidney disease, stage 3b: Secondary | ICD-10-CM | POA: Diagnosis not present

## 2023-06-17 DIAGNOSIS — I5033 Acute on chronic diastolic (congestive) heart failure: Secondary | ICD-10-CM | POA: Diagnosis not present

## 2023-06-17 DIAGNOSIS — E1122 Type 2 diabetes mellitus with diabetic chronic kidney disease: Secondary | ICD-10-CM | POA: Diagnosis not present

## 2023-06-18 DIAGNOSIS — E1122 Type 2 diabetes mellitus with diabetic chronic kidney disease: Secondary | ICD-10-CM | POA: Diagnosis not present

## 2023-06-18 DIAGNOSIS — I130 Hypertensive heart and chronic kidney disease with heart failure and stage 1 through stage 4 chronic kidney disease, or unspecified chronic kidney disease: Secondary | ICD-10-CM | POA: Diagnosis not present

## 2023-06-18 DIAGNOSIS — N1832 Chronic kidney disease, stage 3b: Secondary | ICD-10-CM | POA: Diagnosis not present

## 2023-06-18 DIAGNOSIS — J441 Chronic obstructive pulmonary disease with (acute) exacerbation: Secondary | ICD-10-CM | POA: Diagnosis not present

## 2023-06-18 DIAGNOSIS — I5033 Acute on chronic diastolic (congestive) heart failure: Secondary | ICD-10-CM | POA: Diagnosis not present

## 2023-06-18 DIAGNOSIS — E1151 Type 2 diabetes mellitus with diabetic peripheral angiopathy without gangrene: Secondary | ICD-10-CM | POA: Diagnosis not present

## 2023-06-18 DIAGNOSIS — J439 Emphysema, unspecified: Secondary | ICD-10-CM | POA: Diagnosis not present

## 2023-06-18 DIAGNOSIS — I5022 Chronic systolic (congestive) heart failure: Secondary | ICD-10-CM | POA: Diagnosis not present

## 2023-06-18 DIAGNOSIS — J9621 Acute and chronic respiratory failure with hypoxia: Secondary | ICD-10-CM | POA: Diagnosis not present

## 2023-06-19 ENCOUNTER — Encounter: Payer: Self-pay | Admitting: Internal Medicine

## 2023-06-19 ENCOUNTER — Ambulatory Visit: Payer: Medicare HMO | Admitting: Internal Medicine

## 2023-06-19 VITALS — BP 160/70 | HR 95 | Temp 98.0°F | Resp 18 | Ht 65.0 in | Wt 150.8 lb

## 2023-06-19 DIAGNOSIS — E1151 Type 2 diabetes mellitus with diabetic peripheral angiopathy without gangrene: Secondary | ICD-10-CM | POA: Diagnosis not present

## 2023-06-19 DIAGNOSIS — I82432 Acute embolism and thrombosis of left popliteal vein: Secondary | ICD-10-CM

## 2023-06-19 DIAGNOSIS — N1832 Chronic kidney disease, stage 3b: Secondary | ICD-10-CM | POA: Diagnosis not present

## 2023-06-19 DIAGNOSIS — J441 Chronic obstructive pulmonary disease with (acute) exacerbation: Secondary | ICD-10-CM | POA: Diagnosis not present

## 2023-06-19 DIAGNOSIS — I5022 Chronic systolic (congestive) heart failure: Secondary | ICD-10-CM | POA: Diagnosis not present

## 2023-06-19 DIAGNOSIS — E1122 Type 2 diabetes mellitus with diabetic chronic kidney disease: Secondary | ICD-10-CM | POA: Diagnosis not present

## 2023-06-19 DIAGNOSIS — I5033 Acute on chronic diastolic (congestive) heart failure: Secondary | ICD-10-CM | POA: Diagnosis not present

## 2023-06-19 DIAGNOSIS — I130 Hypertensive heart and chronic kidney disease with heart failure and stage 1 through stage 4 chronic kidney disease, or unspecified chronic kidney disease: Secondary | ICD-10-CM | POA: Diagnosis not present

## 2023-06-19 DIAGNOSIS — J9621 Acute and chronic respiratory failure with hypoxia: Secondary | ICD-10-CM | POA: Diagnosis not present

## 2023-06-19 DIAGNOSIS — J439 Emphysema, unspecified: Secondary | ICD-10-CM | POA: Diagnosis not present

## 2023-06-19 NOTE — Assessment & Plan Note (Signed)
She has an IVC filter in place which should protect from PE.  I want her to come off the eliquis at this time and hold her ASA for 3 days.  She should not be on anticoagulants due to GIB and history of AVM's.

## 2023-06-19 NOTE — Progress Notes (Signed)
Office Visit  Subjective   Patient ID: [name]   DOB: [birth year]   Age: 82 y.o.   MRN: [id number]   Chief Complaint Chief Complaint  Patient presents with   Follow-up    From  ER     History of Present Illness [name] is a 82 yo female who comes in today for an ER followup where she was seen at the [facility] ER on 06/10/2023 for worsening erythema to her lower extremities.  This was painful to the touch with leg swelling of her left leg.  The ER obtained a venous doppler US on 06/10/2023 and this showed a partially occlusive thrombus in the left popliteal vein.  The ER started her on eliquis 10mg  starting dose and asked her to continue on eliquis and followup with me.  The patient began having nose bleeds yesterday which she states started yesterday.  These are mild and she states she is able to control these nose bleeds.  She denies any BRBPR or melena.  She had swelling of her leg but this is improved today.    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  She was admitted in 02/2018 where she was noted to have chronic mesenteric ischemia and they placed a superior mesenteric artery stent.   She has a history of GIB in 2018 where she was on xarelto for a history of a DVT but was found to have acute blood loss anemia due to an upper GI bleed.  They did an EGD 01/2017 which showed nonbleeding erosive gastropathy and a capsule endoscopy showed gastric erosions.  She also had AVM's in her small bowel and a colonoscopy also performed in 01/2017 showed severe pan diverticulosis.  She is now permanently off her xarelto due to her her GIB.  They did not anticoagulate her but she had an IVC filter placed on 03/14/2018.  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  The patient was placed on ASA and plavix at that time but  developed a large abdominal wall hematoma.  She was discharged and sent to a SNF for rehab but was readmitted a week later to [facility] due to hematuria where she was admitted on 04/01/2018.  Urology saw her and discontinued her plavix and did cystoscopy as part of her workup where they did do a transurethral resection of a bladder tumor.  The patient is not on iron at this time and she is only on ASA 81mg  daily.       Past Medical History Past Medical History:  Diagnosis Date   Anemia    bld. transfusion post lumbar surgery- 2012   Anxiety    Arthralgia    NOS   CAD (coronary artery disease)    s/p CABG 2004; s/p DES to LM in 2010;  LHC 10/29/11: EF 50-55%, mild elevated filling pressures, no pulmonary HTN, LM 90% ISR, LAD and CFX occluded, S-RI occluded (old), S-OM3 ok and L-LAD ok, native nondominant RCA 95% -  med rx recommended ; Lexiscan Myoview 7/13 at [facility]: demonstrated "normal LV function, anterior attenuation and localized ischemia, inferior, basilar, mid section"   Carotid artery disease (HCC)    Carotid US 6/19:  RICA 1-39; LICA 40-59; R subclavian stenosis - Repeat 1 year. //  Carotid US 05/2019: R 1-39; L 40-59; R vertebral with atypical antegrade flow; R subclavian stenosis >> repeat 1 year // Carotid US 7/21: Bilat 40-59; L subclavian stenosis // Carotid US 7/22: Bilat ICA 40-59; R subclavian stenosis   CHF (congestive heart failure) (HCC) 01/21/2020   Chronic diastolic heart failure (HCC)    Echo 9/10: EF 60-55%, grade 1 diastolic dysfunction   Chronic lower back pain    Chronic respiratory failure (HCC)    Cirrhosis (HCC)    CKD (chronic kidney disease), stage III (HCC)    COPD (chronic obstructive pulmonary disease) (HCC)    Emphysema dxed by Dr. Sherril Croon in [place] based on PFTs per pt in 2006; placed on albuterol   Degenerative disc disease, lumbar    Depression    TAKES CELEXA  AND  (OFF- WELBUTRIN)   Diuretic-induced hypokalemia 10/08/2018   DVT of lower  extremity (deep venous thrombosis) (HCC)    recurrent. bilateral (2 episodes)   Dyspnea    home o2 when needed   Dysrhythmia    afib with cabg   Exertional angina    Treated with Isosorbide, Ranexa, amlodipine; intolerant to metoprolol   GERD (gastroesophageal reflux disease)    Gout    "on daily RX" (10/06/2018)   HLD (hyperlipidemia)    Hyperlipidemia    Hypertension    Hypothyroidism    L Subclavian Artery Stenosis    Carotid US 7/21    Obesity (BMI 30-39.9) 2009   BMI 33   On home oxygen therapy    OSA (obstructive sleep apnea)    Osteoarthritis    "all over; hands" (10/06/2018)   Osteoporosis    Oxygen dependent    4L at home as needed (10/06/2018)   PVD (peripheral vascular disease) (HCC)    Restless leg syndrome    Vitamin D deficiency      Allergies Allergies  Allergen Reactions   Ativan [Lorazepam] Other (See Comments)    Confusion    Pitavastatin Itching and Other (See Comments)    Reaction to Livalo   Ropinirole Nausea Only and Other (See Comments)    Makes legs jump, also   Zofran [Ondansetron Hcl] Nausea Only   Zolpidem Tartrate Anxiety and Other (See Comments)    CONFUSION    Zolpidem Tartrate Swelling    CONFUSION   Penicillins Rash and Other (See Comments)    Tolerated cephalosporins in past Has patient had a PCN reaction causing immediate rash, facial/tongue/throat swelling, SOB or lightheadedness with hypotension: Yes Has patient had a PCN reaction causing severe rash involving mucus membranes or skin necrosis: No Has patient had a PCN reaction that required hospitalization: No Has patient had a PCN reaction occurring within the last 10 years: No If all of the above answers are "NO", then may proceed with Cephalosporin use.      Medications  Current Outpatient Medications:    acetaminophen (TYLENOL) 500 MG tablet, Take 1,000 mg by mouth daily., Disp: , Rfl:    albuterol (PROVENTIL) (2.5 MG/3ML) 0.083% nebulizer solution, Take 2.5 mg by  nebulization every 6 (six) hours as needed for wheezing or shortness of breath., Disp: , Rfl:    allopurinol (ZYLOPRIM) 100 MG tablet, TAKE 1 TABLET BY MOUTH DAILY WITH BREAKFAST., Disp: 90 tablet, Rfl: 0   amLODipine (NORVASC) 2.5 MG tablet, Take 1/2 tablet as needed for BP greater than 160., Disp: 30 tablet, Rfl: 1   ARIPiprazole (ABILIFY) 2 MG tablet, TAKE 1 TABLET BY MOUTH ONCE DAILY, Disp: 90  tablet, Rfl: 0   aspirin EC 81 MG tablet, Take 81 mg by mouth daily with breakfast. Swallow whole., Disp: , Rfl:    atorvastatin (LIPITOR) 20 MG tablet, TAKE 1 TABLET ONCE DAILY AT BEDTIME (Patient taking differently: Take 20 mg by mouth daily.), Disp: 90 tablet, Rfl: 0   cholecalciferol (VITAMIN D3) 25 MCG (1000 UNIT) tablet, Take 1 tablet (1,000 Units total) by mouth daily with supper., Disp: 90 tablet, Rfl: 1   citalopram (CELEXA) 10 MG tablet, TAKE 1 TABLET BY MOUTH ONCE DAILY, Disp: 90 tablet, Rfl: 0   cyclobenzaprine (FLEXERIL) 10 MG tablet, Take 10 mg by mouth 3 (three) times daily as needed for muscle spasms., Disp: , Rfl:    D3-1000 25 MCG (1000 UT) capsule, TAKE 1 CAPSULE BY MOUTH DAILY WITH SUPPER., Disp: 90 capsule, Rfl: 0   dapagliflozin propanediol (FARXIGA) 10 MG TABS tablet, Take 1 tablet (10 mg total) by mouth daily before breakfast., Disp: 90 tablet, Rfl: 3   Docusate Sodium (DSS) 100 MG CAPS, Take 100 mg by mouth daily as needed (constipation)., Disp: , Rfl:    donepezil (ARICEPT) 10 MG tablet, Take 1 tablet (10 mg total) by mouth at bedtime., Disp: 30 tablet, Rfl: 1   donepezil (ARICEPT) 5 MG tablet, Take 1 tablet (5 mg total) by mouth at bedtime., Disp: 30 tablet, Rfl: 0   feeding supplement (ENSURE ENLIVE / ENSURE PLUS) LIQD, Take 237 mLs by mouth 2 (two) times daily between meals., Disp: , Rfl:    ferrous sulfate 325 (65 FE) MG tablet, TAKE 1 TABLET 2 TIMES PER DAY (Patient taking differently: Take 325 mg by mouth 2 (two) times daily.), Disp: 180 tablet, Rfl: 0   fluconazole  (DIFLUCAN) 150 MG tablet, Take 1 tablet (150 mg total) by mouth every other day., Disp: 4 tablet, Rfl: 0   fluticasone-salmeterol (ADVAIR DISKUS) 250-50 MCG/ACT AEPB, Inhale 1 puff into the lungs in the morning and at bedtime., Disp: 1 each, Rfl: 5   Fluticasone-Umeclidin-Vilant (TRELEGY ELLIPTA) 200-62.5-25 MCG/ACT AEPB, Inhale 1 puff into the lungs daily., Disp: 1 each, Rfl: 5   furosemide (LASIX) 80 MG tablet, Take 1 tablet (80 mg total) by mouth every other day. PLEASE RESUME THIS MEDICATION ONCE YOU ARE EATING AND DRINKING BETTER., Disp: , Rfl:    gabapentin (NEURONTIN) 100 MG capsule, TAKE 2 CAPSULES BY MOUTH TWICE DAILY (Patient taking differently: Take 100 mg by mouth 2 (two) times daily. Take 1 capsule by mouth in the Morning, and 2 at Night), Disp: 360 capsule, Rfl: 2   Ipratropium-Albuterol (COMBIVENT RESPIMAT) 20-100 MCG/ACT AERS respimat, Inhale 1 puff into the lungs 3 (three) times daily as needed for wheezing or shortness of breath., Disp: , Rfl:    levofloxacin (LEVAQUIN) 750 MG tablet, Take 1 tablet (750 mg total) by mouth every other day., Disp: 4 tablet, Rfl: 0   levothyroxine (SYNTHROID) 50 MCG tablet, TAKE 1 TABLET BY MOUTH DAILY BEFORE BREAKFAST., Disp: 90 tablet, Rfl: 3   Magnesium Oxide -Mg Supplement 500 MG CAPS, TAKE 1 CAPSULE BY MOUTH DAILY, Disp: 90 capsule, Rfl: 0   Melatonin 10 MG TABS, Take 10 mg by mouth at bedtime., Disp: , Rfl:    metolazone (ZAROXOLYN) 2.5 MG tablet, Take 1 tablet (2.5 mg total) by mouth every 14 (fourteen) days., Disp: 30 tablet, Rfl: 5   metoprolol tartrate (LOPRESSOR) 25 MG tablet, TAKE 1/2 TABLET BY MOUTH TWICE DAILY, Disp: 15 tablet, Rfl: 1   Multiple Vitamin (MULTIVITAMIN WITH MINERALS) TABS tablet,  Take 1 tablet by mouth daily., Disp: , Rfl:    nitroGLYCERIN (NITROSTAT) 0.4 MG SL tablet, Place 1 tablet (0.4 mg total) under the tongue every 5 (five) minutes as needed for chest pain. For chest pain, Disp: 25 tablet, Rfl: 3   OXYGEN, Inhale 4  L/min into the lungs as needed (DURING ALL TIMES OF EXERTION). , Disp: , Rfl:    pantoprazole (PROTONIX) 40 MG tablet, TAKE 1 TABLET ONCE DAILY, Disp: 90 tablet, Rfl: 2   pilocarpine (PILOCAR) 1 % ophthalmic solution, Place 1 drop into both eyes 2 (two) times daily., Disp: , Rfl:    polyethylene glycol (MIRALAX / GLYCOLAX) 17 g packet, Take 17 g by mouth as needed., Disp: , Rfl:    potassium chloride SA (KLOR-CON M) 20 MEQ tablet, TAKE 1 TABLET ONCE DAILY WITH FOOD, Disp: 90 tablet, Rfl: 0   pramipexole (MIRAPEX) 0.25 MG tablet, TAKE 1 TABLET 2-3 HOURS BEFORE BEDTIME (Patient taking differently: Take 0.25 mg by mouth at bedtime. Take 1 tablet by mouth 2-3 hours before bedtime), Disp: 90 tablet, Rfl: 0   predniSONE (DELTASONE) 10 MG tablet, Day 1-3: take 4 tabs po daily, Day 4-6: take 3 tabs po daily, Day 7-9: take 2 tabs po daily, Day 10-12: take 1 tab po daily, Disp: 30 tablet, Rfl: 0   roflumilast (DALIRESP) 500 MCG TABS tablet, TAKE 1 TABLET ONCE DAILY, Disp: 90 tablet, Rfl: 0   spironolactone (ALDACTONE) 25 MG tablet, Take 1 tablet (25 mg total) by mouth daily. PLEASE RESUME THIS MEDICATION ONCE YOU ARE EATING AND DRINKING BETTER., Disp: 90 tablet, Rfl: 2   traZODone (DESYREL) 50 MG tablet, TAKE 3 TABLETS EVERY NIGHT AT BEDTIME. (Patient taking differently: Take 150 tablets by mouth at bedtime.), Disp: 270 tablet, Rfl: 0   vitamin C (ASCORBIC ACID) 500 MG tablet, Take 500 mg by mouth daily with lunch., Disp: , Rfl:    XTAMPZA ER 9 MG C12A, Take 9 mg by mouth in the morning, at noon, and at bedtime., Disp: 90 capsule, Rfl: 0   Review of Systems Review of Systems  Constitutional:  Negative for chills, fever, malaise/fatigue and weight loss.  Respiratory:  Positive for shortness of breath. Negative for cough.   Cardiovascular:  Negative for chest pain, palpitations and leg swelling.  Gastrointestinal:  Negative for abdominal pain, constipation, diarrhea, heartburn, nausea and vomiting.   Genitourinary:  Negative for hematuria.  Musculoskeletal:  Negative for myalgias.  Skin:  Negative for itching and rash.  Neurological:  Positive for headaches. Negative for dizziness and weakness.       Objective:    Vitals BP (!) 160/70   Pulse 95   Temp 98 F (36.7 C)   Resp 18   Ht 5\' 5"  (1.651 m)   Wt 150 lb 12.8 oz (68.4 kg)   SpO2 94%   BMI 25.09 kg/m    Physical Examination Physical Exam Constitutional:      Appearance: Normal appearance. She is not ill-appearing.  Cardiovascular:     Rate and Rhythm: Normal rate and regular rhythm.     Pulses: Normal pulses.     Heart sounds: No murmur heard.    No friction rub. No gallop.  Pulmonary:     Effort: Pulmonary effort is normal. No respiratory distress.     Breath sounds: No wheezing, rhonchi or rales.  Abdominal:     General: Bowel sounds are normal. There is no distension.     Palpations: Abdomen is soft.  Tenderness: There is no abdominal tenderness.  Musculoskeletal:     Right lower leg: No edema.     Left lower leg: No edema.  Skin:    General: Skin is warm and dry.     Findings: No rash.  Neurological:     Mental Status: She is alert.        Assessment & Plan:   Acute deep vein thrombosis (DVT) of popliteal vein of left lower extremity (HCC) She has an IVC filter in place which should protect from PE.  I want her to come off the eliquis at this time and hold her ASA for 3 days.  She should not be on anticoagulants due to GIB and history of AVM's.    No follow-ups on file.   Crist Fat, MD

## 2023-06-22 DIAGNOSIS — J9621 Acute and chronic respiratory failure with hypoxia: Secondary | ICD-10-CM | POA: Diagnosis not present

## 2023-06-22 DIAGNOSIS — I5022 Chronic systolic (congestive) heart failure: Secondary | ICD-10-CM | POA: Diagnosis not present

## 2023-06-22 DIAGNOSIS — J439 Emphysema, unspecified: Secondary | ICD-10-CM | POA: Diagnosis not present

## 2023-06-22 DIAGNOSIS — I5033 Acute on chronic diastolic (congestive) heart failure: Secondary | ICD-10-CM | POA: Diagnosis not present

## 2023-06-22 DIAGNOSIS — J441 Chronic obstructive pulmonary disease with (acute) exacerbation: Secondary | ICD-10-CM | POA: Diagnosis not present

## 2023-06-22 DIAGNOSIS — E1122 Type 2 diabetes mellitus with diabetic chronic kidney disease: Secondary | ICD-10-CM | POA: Diagnosis not present

## 2023-06-22 DIAGNOSIS — E1151 Type 2 diabetes mellitus with diabetic peripheral angiopathy without gangrene: Secondary | ICD-10-CM | POA: Diagnosis not present

## 2023-06-22 DIAGNOSIS — I130 Hypertensive heart and chronic kidney disease with heart failure and stage 1 through stage 4 chronic kidney disease, or unspecified chronic kidney disease: Secondary | ICD-10-CM | POA: Diagnosis not present

## 2023-06-22 DIAGNOSIS — N1832 Chronic kidney disease, stage 3b: Secondary | ICD-10-CM | POA: Diagnosis not present

## 2023-06-24 DIAGNOSIS — J439 Emphysema, unspecified: Secondary | ICD-10-CM | POA: Diagnosis not present

## 2023-06-24 DIAGNOSIS — E1151 Type 2 diabetes mellitus with diabetic peripheral angiopathy without gangrene: Secondary | ICD-10-CM | POA: Diagnosis not present

## 2023-06-24 DIAGNOSIS — E1122 Type 2 diabetes mellitus with diabetic chronic kidney disease: Secondary | ICD-10-CM | POA: Diagnosis not present

## 2023-06-24 DIAGNOSIS — N1832 Chronic kidney disease, stage 3b: Secondary | ICD-10-CM | POA: Diagnosis not present

## 2023-06-24 DIAGNOSIS — I5033 Acute on chronic diastolic (congestive) heart failure: Secondary | ICD-10-CM | POA: Diagnosis not present

## 2023-06-24 DIAGNOSIS — I130 Hypertensive heart and chronic kidney disease with heart failure and stage 1 through stage 4 chronic kidney disease, or unspecified chronic kidney disease: Secondary | ICD-10-CM | POA: Diagnosis not present

## 2023-06-24 DIAGNOSIS — J9621 Acute and chronic respiratory failure with hypoxia: Secondary | ICD-10-CM | POA: Diagnosis not present

## 2023-06-24 DIAGNOSIS — I5022 Chronic systolic (congestive) heart failure: Secondary | ICD-10-CM | POA: Diagnosis not present

## 2023-06-24 DIAGNOSIS — J441 Chronic obstructive pulmonary disease with (acute) exacerbation: Secondary | ICD-10-CM | POA: Diagnosis not present

## 2023-06-30 DIAGNOSIS — J439 Emphysema, unspecified: Secondary | ICD-10-CM | POA: Diagnosis not present

## 2023-06-30 DIAGNOSIS — J9621 Acute and chronic respiratory failure with hypoxia: Secondary | ICD-10-CM | POA: Diagnosis not present

## 2023-06-30 DIAGNOSIS — I130 Hypertensive heart and chronic kidney disease with heart failure and stage 1 through stage 4 chronic kidney disease, or unspecified chronic kidney disease: Secondary | ICD-10-CM | POA: Diagnosis not present

## 2023-06-30 DIAGNOSIS — J441 Chronic obstructive pulmonary disease with (acute) exacerbation: Secondary | ICD-10-CM | POA: Diagnosis not present

## 2023-06-30 DIAGNOSIS — I5022 Chronic systolic (congestive) heart failure: Secondary | ICD-10-CM | POA: Diagnosis not present

## 2023-06-30 DIAGNOSIS — N1832 Chronic kidney disease, stage 3b: Secondary | ICD-10-CM | POA: Diagnosis not present

## 2023-06-30 DIAGNOSIS — E1151 Type 2 diabetes mellitus with diabetic peripheral angiopathy without gangrene: Secondary | ICD-10-CM | POA: Diagnosis not present

## 2023-06-30 DIAGNOSIS — E1122 Type 2 diabetes mellitus with diabetic chronic kidney disease: Secondary | ICD-10-CM | POA: Diagnosis not present

## 2023-06-30 DIAGNOSIS — I5033 Acute on chronic diastolic (congestive) heart failure: Secondary | ICD-10-CM | POA: Diagnosis not present

## 2023-07-01 ENCOUNTER — Other Ambulatory Visit: Payer: Self-pay

## 2023-07-01 ENCOUNTER — Emergency Department (HOSPITAL_COMMUNITY): Payer: Medicare HMO

## 2023-07-01 ENCOUNTER — Encounter (HOSPITAL_COMMUNITY): Payer: Self-pay | Admitting: Emergency Medicine

## 2023-07-01 ENCOUNTER — Emergency Department (HOSPITAL_COMMUNITY)
Admission: EM | Admit: 2023-07-01 | Discharge: 2023-07-01 | Disposition: A | Discharge status: Home / Self Care | Payer: Medicare HMO | Attending: Emergency Medicine | Admitting: Emergency Medicine

## 2023-07-01 DIAGNOSIS — I5022 Chronic systolic (congestive) heart failure: Secondary | ICD-10-CM | POA: Diagnosis not present

## 2023-07-01 DIAGNOSIS — I5033 Acute on chronic diastolic (congestive) heart failure: Secondary | ICD-10-CM | POA: Diagnosis not present

## 2023-07-01 DIAGNOSIS — R29818 Other symptoms and signs involving the nervous system: Secondary | ICD-10-CM | POA: Diagnosis not present

## 2023-07-01 DIAGNOSIS — I708 Atherosclerosis of other arteries: Secondary | ICD-10-CM | POA: Diagnosis not present

## 2023-07-01 DIAGNOSIS — I10 Essential (primary) hypertension: Secondary | ICD-10-CM | POA: Diagnosis not present

## 2023-07-01 DIAGNOSIS — J441 Chronic obstructive pulmonary disease with (acute) exacerbation: Secondary | ICD-10-CM | POA: Diagnosis not present

## 2023-07-01 DIAGNOSIS — I509 Heart failure, unspecified: Secondary | ICD-10-CM | POA: Insufficient documentation

## 2023-07-01 DIAGNOSIS — R201 Hypoesthesia of skin: Secondary | ICD-10-CM | POA: Diagnosis not present

## 2023-07-01 DIAGNOSIS — K5730 Diverticulosis of large intestine without perforation or abscess without bleeding: Secondary | ICD-10-CM | POA: Diagnosis not present

## 2023-07-01 DIAGNOSIS — Z79899 Other long term (current) drug therapy: Secondary | ICD-10-CM | POA: Insufficient documentation

## 2023-07-01 DIAGNOSIS — N281 Cyst of kidney, acquired: Secondary | ICD-10-CM | POA: Diagnosis not present

## 2023-07-01 DIAGNOSIS — E1122 Type 2 diabetes mellitus with diabetic chronic kidney disease: Secondary | ICD-10-CM | POA: Diagnosis not present

## 2023-07-01 DIAGNOSIS — J439 Emphysema, unspecified: Secondary | ICD-10-CM | POA: Diagnosis not present

## 2023-07-01 DIAGNOSIS — N1832 Chronic kidney disease, stage 3b: Secondary | ICD-10-CM | POA: Diagnosis not present

## 2023-07-01 DIAGNOSIS — I110 Hypertensive heart disease with heart failure: Secondary | ICD-10-CM | POA: Insufficient documentation

## 2023-07-01 DIAGNOSIS — J449 Chronic obstructive pulmonary disease, unspecified: Secondary | ICD-10-CM | POA: Insufficient documentation

## 2023-07-01 DIAGNOSIS — Z96611 Presence of right artificial shoulder joint: Secondary | ICD-10-CM | POA: Diagnosis not present

## 2023-07-01 DIAGNOSIS — I130 Hypertensive heart and chronic kidney disease with heart failure and stage 1 through stage 4 chronic kidney disease, or unspecified chronic kidney disease: Secondary | ICD-10-CM | POA: Diagnosis not present

## 2023-07-01 DIAGNOSIS — Z7982 Long term (current) use of aspirin: Secondary | ICD-10-CM | POA: Diagnosis not present

## 2023-07-01 DIAGNOSIS — F458 Other somatoform disorders: Secondary | ICD-10-CM | POA: Diagnosis not present

## 2023-07-01 DIAGNOSIS — R448 Other symptoms and signs involving general sensations and perceptions: Secondary | ICD-10-CM | POA: Diagnosis not present

## 2023-07-01 DIAGNOSIS — R202 Paresthesia of skin: Secondary | ICD-10-CM

## 2023-07-01 DIAGNOSIS — J9621 Acute and chronic respiratory failure with hypoxia: Secondary | ICD-10-CM | POA: Diagnosis not present

## 2023-07-01 DIAGNOSIS — G930 Cerebral cysts: Secondary | ICD-10-CM | POA: Diagnosis not present

## 2023-07-01 DIAGNOSIS — E039 Hypothyroidism, unspecified: Secondary | ICD-10-CM | POA: Diagnosis not present

## 2023-07-01 DIAGNOSIS — R09A2 Foreign body sensation, throat: Secondary | ICD-10-CM

## 2023-07-01 DIAGNOSIS — E1151 Type 2 diabetes mellitus with diabetic peripheral angiopathy without gangrene: Secondary | ICD-10-CM | POA: Diagnosis not present

## 2023-07-01 DIAGNOSIS — R070 Pain in throat: Secondary | ICD-10-CM | POA: Diagnosis not present

## 2023-07-01 DIAGNOSIS — R209 Unspecified disturbances of skin sensation: Secondary | ICD-10-CM | POA: Diagnosis not present

## 2023-07-01 LAB — CBC WITH DIFFERENTIAL/PLATELET
Abs Immature Granulocytes: 0.21 10*3/uL — ABNORMAL HIGH (ref 0.00–0.07)
Basophils Absolute: 0.1 10*3/uL (ref 0.0–0.1)
Basophils Relative: 1 %
Eosinophils Absolute: 0.1 10*3/uL (ref 0.0–0.5)
Eosinophils Relative: 1 %
HCT: 42 % (ref 36.0–46.0)
Hemoglobin: 13.1 g/dL (ref 12.0–15.0)
Immature Granulocytes: 2 %
Lymphocytes Relative: 15 %
Lymphs Abs: 1.5 10*3/uL (ref 0.7–4.0)
MCH: 29.5 pg (ref 26.0–34.0)
MCHC: 31.2 g/dL (ref 30.0–36.0)
MCV: 94.6 fL (ref 80.0–100.0)
Monocytes Absolute: 0.9 10*3/uL (ref 0.1–1.0)
Monocytes Relative: 10 %
Neutro Abs: 7.1 10*3/uL (ref 1.7–7.7)
Neutrophils Relative %: 71 %
Platelets: 242 10*3/uL (ref 150–400)
RBC: 4.44 MIL/uL (ref 3.87–5.11)
RDW: 16.8 % — ABNORMAL HIGH (ref 11.5–15.5)
WBC: 9.9 10*3/uL (ref 4.0–10.5)
nRBC: 0 % (ref 0.0–0.2)

## 2023-07-01 LAB — COMPREHENSIVE METABOLIC PANEL
ALT: 11 U/L (ref 0–44)
AST: 16 U/L (ref 15–41)
Albumin: 3.3 g/dL — ABNORMAL LOW (ref 3.5–5.0)
Alkaline Phosphatase: 79 U/L (ref 38–126)
Anion gap: 7 (ref 5–15)
BUN: 14 mg/dL (ref 8–23)
CO2: 30 mmol/L (ref 22–32)
Calcium: 9.2 mg/dL (ref 8.9–10.3)
Chloride: 103 mmol/L (ref 98–111)
Creatinine, Ser: 1.09 mg/dL — ABNORMAL HIGH (ref 0.44–1.00)
GFR, Estimated: 51 mL/min — ABNORMAL LOW (ref >60)
Glucose, Bld: 91 mg/dL (ref 70–99)
Potassium: 4.8 mmol/L (ref 3.5–5.1)
Sodium: 140 mmol/L (ref 135–145)
Total Bilirubin: 0.6 mg/dL (ref 0.3–1.2)
Total Protein: 6.7 g/dL (ref 6.5–8.1)

## 2023-07-01 LAB — URINALYSIS, ROUTINE W REFLEX MICROSCOPIC
Bacteria, UA: NONE SEEN
Bilirubin Urine: NEGATIVE
Glucose, UA: >=500 mg/dL — AB
Hgb urine dipstick: NEGATIVE
Ketones, ur: NEGATIVE mg/dL
Leukocytes,Ua: NEGATIVE
Nitrite: NEGATIVE
Protein, ur: NEGATIVE mg/dL
Specific Gravity, Urine: 1.017 (ref 1.005–1.030)
pH: 6 (ref 5.0–8.0)

## 2023-07-01 LAB — TROPONIN I (HIGH SENSITIVITY): Troponin I (High Sensitivity): 8 ng/L (ref <18)

## 2023-07-01 LAB — LIPASE, BLOOD: Lipase: 25 U/L (ref 11–51)

## 2023-07-01 MED ORDER — FAMOTIDINE 20 MG PO TABS: 20.0000 mg | ORAL_TABLET | Freq: Two times a day (BID) | ORAL | 0 refills | Status: DC

## 2023-07-01 MED ORDER — FAMOTIDINE 20 MG PO TABS
20.0000 mg | ORAL_TABLET | Freq: Once | ORAL | Status: AC
  Administered 2023-07-01: 20 mg via ORAL
  Filled 2023-07-01: qty 1

## 2023-07-01 MED ORDER — MAALOX MAX 400-400-40 MG/5ML PO SUSP: 15.0000 mL | Freq: Four times a day (QID) | ORAL | 0 refills | Status: DC | PRN

## 2023-07-01 MED ORDER — FAMOTIDINE 20 MG PO TABS: 20.0000 mg | ORAL_TABLET | Freq: Every day | ORAL | 0 refills | Status: DC

## 2023-07-01 MED ORDER — ALUM & MAG HYDROXIDE-SIMETH 200-200-20 MG/5ML PO SUSP
30.0000 mL | Freq: Once | ORAL | Status: AC
  Administered 2023-07-01: 30 mL via ORAL
  Filled 2023-07-01: qty 30

## 2023-07-01 NOTE — ED Triage Notes (Signed)
BIB EMS from home.  Sunday began having left side pain radiating up into her throat.  Then developed left leg weakness.  LKW Sunday.   New home health staff came out today and recommended she call 911.   NIH 0.  BP 134/74  HR 82.

## 2023-07-01 NOTE — ED Provider Notes (Signed)
[place] EMERGENCY DEPARTMENT AT [facility] Provider Note   CSN: [id number] Arrival date & time: 07/01/23  1535     History  Chief Complaint  Patient presents with   Numbness    [name] is a 82 y.o. female.  Patient is an 82 year old female with a past medical history of recurrent GI bleed, DVT with IVC filter in place, hypertension, hypothyroid, CHF COPD presenting to the emergency department with throat pain and left leg numbness.  The patient states that on Sunday she suddenly felt like she was choking.  She states that she was not eating at this time and she states that she has had a sensation of choking since then.  She states that she has been able to eat normally without any nausea or vomiting.  She denies significant chest discomfort and denies any associated abdominal or back pain.  She states that she does have chronic back issues and a back surgery several years ago but her back has not been bothering her recently.  She denies any dysuria or hematuria, diarrhea or constipation.  She states that on Sunday her left leg and arm also started to feel weak as well as some numbness in her left leg.  She states that she does have a history of a bad shoulder on her left so she has difficulty lifting her left arm at baseline.  She denies any facial numbness or facial droop or changes in voice.  The history is provided by the patient and a relative.       Home Medications Prior to Admission medications   Medication Sig Start Date End Date Taking? Authorizing Provider  alum & mag hydroxide-simeth (MAALOX MAX) 400-400-40 MG/5ML suspension Take 15 mLs by mouth every 6 (six) hours as needed for indigestion. 07/01/23  Yes Elayne Snare K, DO  acetaminophen (TYLENOL) 500 MG tablet Take 1,000 mg by mouth daily. 02/08/20   [provider]  albuterol (PROVENTIL) (2.5 MG/3ML) 0.083% nebulizer solution Take 2.5 mg by nebulization every 6 (six) hours as needed  for wheezing or shortness of breath.    [provider]  allopurinol (ZYLOPRIM) 100 MG tablet TAKE 1 TABLET BY MOUTH DAILY WITH BREAKFAST. 05/08/23   Crist Fat, MD  amLODipine (NORVASC) 2.5 MG tablet Take 1/2 tablet as needed for BP greater than 160. 02/24/22   Pricilla Riffle, MD  ARIPiprazole (ABILIFY) 2 MG tablet TAKE 1 TABLET BY MOUTH ONCE DAILY 06/11/23   Leonia Reader, Barbara Cower, MD  aspirin EC 81 MG tablet Take 81 mg by mouth daily with breakfast. Swallow whole.    [provider]  atorvastatin (LIPITOR) 20 MG tablet TAKE 1 TABLET ONCE DAILY AT BEDTIME Patient taking differently: Take 20 mg by mouth daily. 05/08/23   Crist Fat, MD  cholecalciferol (VITAMIN D3) 25 MCG (1000 UNIT) tablet Take 1 tablet (1,000 Units total) by mouth daily with supper. 11/11/22   Crist Fat, MD  citalopram (CELEXA) 10 MG tablet TAKE 1 TABLET BY MOUTH ONCE DAILY 05/08/23   Leonia Reader, Barbara Cower, MD  cyclobenzaprine (FLEXERIL) 10 MG tablet Take 10 mg by mouth 3 (three) times daily as needed for muscle spasms.    [provider]  D3-1000 25 MCG (1000 UT) capsule TAKE 1 CAPSULE BY MOUTH DAILY WITH SUPPER. 05/08/23   Leonia Reader, Barbara Cower, MD  dapagliflozin propanediol (FARXIGA) 10 MG TABS tablet Take 1 tablet (10 mg total) by mouth daily before breakfast. 07/07/22   Dietrich Pates  V, MD  Docusate Sodium (DSS) 100 MG CAPS Take 100 mg by mouth daily as needed (constipation).    [provider]  donepezil (ARICEPT) 10 MG tablet Take 1 tablet (10 mg total) by mouth at bedtime. 05/30/23   Crist Fat, MD  donepezil (ARICEPT) 5 MG tablet Take 1 tablet (5 mg total) by mouth at bedtime. 04/30/23   Crist Fat, MD  famotidine (PEPCID) 20 MG tablet Take 1 tablet (20 mg total) by mouth daily. 07/01/23   Elayne Snare K, DO  feeding supplement (ENSURE ENLIVE / ENSURE PLUS) LIQD Take 237 mLs by mouth 2 (two) times daily between meals. 05/15/23   Narda Bonds, MD  ferrous sulfate 325 (65 FE) MG tablet TAKE 1  TABLET 2 TIMES PER DAY Patient taking differently: Take 325 mg by mouth 2 (two) times daily. 05/08/23   Crist Fat, MD  fluconazole (DIFLUCAN) 150 MG tablet Take 1 tablet (150 mg total) by mouth every other day. 05/28/23   Crist Fat, MD  fluticasone-salmeterol (ADVAIR DISKUS) 250-50 MCG/ACT AEPB Inhale 1 puff into the lungs in the morning and at bedtime. 01/08/23   Crist Fat, MD  Fluticasone-Umeclidin-Vilant (TRELEGY ELLIPTA) 200-62.5-25 MCG/ACT AEPB Inhale 1 puff into the lungs daily. 05/20/23   Crist Fat, MD  furosemide (LASIX) 80 MG tablet Take 1 tablet (80 mg total) by mouth every other day. PLEASE RESUME THIS MEDICATION ONCE YOU ARE EATING AND DRINKING BETTER. 05/15/23   Narda Bonds, MD  gabapentin (NEURONTIN) 100 MG capsule TAKE 2 CAPSULES BY MOUTH TWICE DAILY Patient taking differently: Take 100 mg by mouth 2 (two) times daily. Take 1 capsule by mouth in the Morning, and 2 at Night 04/20/23   Leonia Reader, Barbara Cower, MD  Ipratropium-Albuterol (COMBIVENT RESPIMAT) 20-100 MCG/ACT AERS respimat Inhale 1 puff into the lungs 3 (three) times daily as needed for wheezing or shortness of breath.    [provider]  levofloxacin (LEVAQUIN) 750 MG tablet Take 1 tablet (750 mg total) by mouth every other day. 05/20/23   Crist Fat, MD  levothyroxine (SYNTHROID) 50 MCG tablet TAKE 1 TABLET BY MOUTH DAILY BEFORE BREAKFAST. 11/07/22   Pricilla Riffle, MD  Magnesium Oxide -Mg Supplement 500 MG CAPS TAKE 1 CAPSULE BY MOUTH DAILY 06/11/23   Leonia Reader, Barbara Cower, MD  Melatonin 10 MG TABS Take 10 mg by mouth at bedtime. 05/15/23   Narda Bonds, MD  metolazone (ZAROXOLYN) 2.5 MG tablet Take 1 tablet (2.5 mg total) by mouth every 14 (fourteen) days. 05/21/23   Pricilla Riffle, MD  metoprolol tartrate (LOPRESSOR) 25 MG tablet TAKE 1/2 TABLET BY MOUTH TWICE DAILY 06/15/23   Crist Fat, MD  Multiple Vitamin (MULTIVITAMIN WITH MINERALS) TABS tablet Take 1 tablet by mouth daily. 05/15/23   Narda Bonds, MD   nitroGLYCERIN (NITROSTAT) 0.4 MG SL tablet Place 1 tablet (0.4 mg total) under the tongue every 5 (five) minutes as needed for chest pain. For chest pain 04/24/16   Pricilla Riffle, MD  OXYGEN Inhale 4 L/min into the lungs as needed (DURING ALL TIMES OF EXERTION).     [provider]  pantoprazole (PROTONIX) 40 MG tablet TAKE 1 TABLET ONCE DAILY 04/20/23   Leonia Reader, Barbara Cower, MD  pilocarpine [facility]) 1 % ophthalmic solution Place 1 drop into both eyes 2 (two) times daily.    [provider]  polyethylene glycol (MIRALAX / GLYCOLAX) 17 g packet Take 17 g by  mouth as needed. 02/08/20   [provider]  potassium chloride SA (KLOR-CON M) 20 MEQ tablet TAKE 1 TABLET ONCE DAILY WITH FOOD 06/09/23   Leonia Reader, Barbara Cower, MD  pramipexole (MIRAPEX) 0.25 MG tablet TAKE 1 TABLET 2-3 HOURS BEFORE BEDTIME Patient taking differently: Take 0.25 mg by mouth at bedtime. Take 1 tablet by mouth 2-3 hours before bedtime 04/09/23   Leonia Reader, Barbara Cower, MD  predniSONE (DELTASONE) 10 MG tablet Day 1-3: take 4 tabs po daily, Day 4-6: take 3 tabs po daily, Day 7-9: take 2 tabs po daily, Day 10-12: take 1 tab po daily 05/20/23   Leonia Reader, Barbara Cower, MD  roflumilast (DALIRESP) 500 MCG TABS tablet TAKE 1 TABLET ONCE DAILY 04/09/23   Crist Fat, MD  spironolactone (ALDACTONE) 25 MG tablet Take 1 tablet (25 mg total) by mouth daily. PLEASE RESUME THIS MEDICATION ONCE YOU ARE EATING AND DRINKING BETTER. 05/15/23   Narda Bonds, MD  traZODone (DESYREL) 50 MG tablet TAKE 3 TABLETS EVERY NIGHT AT BEDTIME. Patient taking differently: Take 150 tablets by mouth at bedtime. 04/09/23   Crist Fat, MD  vitamin C (ASCORBIC ACID) 500 MG tablet Take 500 mg by mouth daily with lunch.    [provider]  XTAMPZA ER 9 MG C12A Take 9 mg by mouth in the morning, at noon, and at bedtime. 06/16/23   Crist Fat, MD      Allergies    Ativan [lorazepam], Pitavastatin, Ropinirole, Zofran [facility] hcl], Zolpidem tartrate,  Zolpidem tartrate, and Penicillins    Review of Systems   Review of Systems  Physical Exam Updated Vital Signs BP (!) 140/46   Pulse 62   Temp 98 F (36.7 C) (Oral)   Resp 16   Ht 5\' 5"  (1.651 m)   Wt 69 kg   SpO2 97%   BMI 25.31 kg/m  Physical Exam Vitals and nursing note reviewed.  Constitutional:      General: She is not in acute distress.    Appearance: Normal appearance.  HENT:     Head: Normocephalic and atraumatic.     Nose: Nose normal.     Mouth/Throat:     Mouth: Mucous membranes are moist.     Pharynx: Oropharynx is clear. No oropharyngeal exudate or posterior oropharyngeal erythema.  Eyes:     Extraocular Movements: Extraocular movements intact.     Conjunctiva/sclera: Conjunctivae normal.     Pupils: Pupils are equal, round, and reactive to light.  Cardiovascular:     Rate and Rhythm: Normal rate and regular rhythm.     Heart sounds: Normal heart sounds.  Pulmonary:     Effort: Pulmonary effort is normal.     Breath sounds: Normal breath sounds.  Abdominal:     General: Abdomen is flat.     Palpations: Abdomen is soft.     Tenderness: There is no abdominal tenderness.  Musculoskeletal:        General: Normal range of motion.     Cervical back: Normal range of motion and neck supple. No tenderness.  Lymphadenopathy:     Cervical: No cervical adenopathy.  Skin:    General: Skin is warm and dry.  Neurological:     General: No focal deficit present.     Mental Status: She is alert and oriented to person, place, and time.     Cranial Nerves: No cranial nerve deficit.     Sensory: No sensory deficit (subjectively decreased sensation in LLE).  Motor: No weakness.     Coordination: Coordination normal.     Comments: No drift in all 4 extremities Normal speech  Psychiatric:        Mood and Affect: Mood normal.        Behavior: Behavior normal.     ED Results / Procedures / Treatments   Labs (all labs ordered are listed, but only abnormal  results are displayed) Labs Reviewed  CBC WITH DIFFERENTIAL/PLATELET - Abnormal; Notable for the following components:      Result Value   RDW 16.8 (*)    Abs Immature Granulocytes 0.21 (*)    All other components within normal limits  COMPREHENSIVE METABOLIC PANEL - Abnormal; Notable for the following components:   Creatinine, Ser 1.09 (*)    Albumin 3.3 (*)    GFR, Estimated 51 (*)    All other components within normal limits  URINALYSIS, ROUTINE W REFLEX MICROSCOPIC - Abnormal; Notable for the following components:   Glucose, UA >=500 (*)    All other components within normal limits  LIPASE, BLOOD  TROPONIN I (HIGH SENSITIVITY)    EKG EKG Interpretation Date/Time:  Wednesday July 01 2023 15:55:52 EDT Ventricular Rate:  64 PR Interval:  192 QRS Duration:  132 QT Interval:  438 QTC Calculation: 451 R Axis:   -68  Text Interpretation: Normal sinus rhythm Right bundle branch block Left anterior fascicular block Bifascicular block Minimal voltage criteria for LVH, may be normal variant ( R in aVL ) Abnormal ECG Interpretation limited secondary to artifact Otherwise no significant change Confirmed by Elayne Snare ([id number]) on 07/01/2023 8:40:25 PM  Radiology MR Brain Wo Contrast (neuro protocol)  Result Date: 07/01/2023 CLINICAL DATA:  Neuro deficit, acute, stroke suspected. EXAM: MRI HEAD WITHOUT CONTRAST TECHNIQUE: Multiplanar, multiecho pulse sequences of the brain and surrounding structures were obtained without intravenous contrast. COMPARISON:  MRI brain 02/20/2011.  Head CT 03/08/2019. FINDINGS: Brain: No acute infarct or hemorrhage. Interval progression of now moderate to severe chronic small-vessel disease. Small right middle cranial fossa arachnoid cyst without significant mass effect on the right temporal pole. No hydrocephalus. Single focus of microhemorrhage in the left parietal lobe. Vascular: Normal flow voids. Skull and upper cervical spine: Normal marrow signal.  Sinuses/Orbits: Unremarkable. Other: None. IMPRESSION: 1. No acute intracranial process. 2. Interval progression of now moderate to severe chronic small-vessel disease. Electronically Signed   By: Orvan Falconer M.D.   On: 07/01/2023 18:20   CT RENAL STONE STUDY  Result Date: 07/01/2023 CLINICAL DATA:  Abdominal pain, flank pain EXAM: CT ABDOMEN AND PELVIS WITHOUT CONTRAST TECHNIQUE: Multidetector CT imaging of the abdomen and pelvis was performed following the standard protocol without IV contrast. RADIATION DOSE REDUCTION: This exam was performed according to the departmental dose-optimization program which includes automated exposure control, adjustment of the mA and/or kV according to patient size and/or use of iterative reconstruction technique. COMPARISON:  07/17/2022 FINDINGS: Lower chest: Scarring in the lung bases. Coronary artery and aortic atherosclerosis. No acute findings. Hepatobiliary: No focal liver abnormality is seen. Status post cholecystectomy. No biliary dilatation. Pancreas: No focal abnormality or ductal dilatation. Spleen: No focal abnormality.  Normal size. Adrenals/Urinary Tract: Adrenal glands normal. 3.4 cm cyst in the lower pole of the right kidney. No follow-up imaging recommended. No renal or ureteral stones. No hydronephrosis. Distal ureters and urinary bladder obscured by beam hardening artifact from bilateral hip replacements. Stomach/Bowel: Large stool burden throughout the colon. Scattered colonic diverticulosis. No active diverticulitis. Appendix is normal. Stomach and  small bowel decompressed, unremarkable. Vascular/Lymphatic: Diffuse aortoiliac atherosclerosis. No evidence of aneurysm or adenopathy. IVC filter in the infrarenal IVC. Reproductive: Obscured by beam hardening artifact from bilateral hip replacements. Other: No free fluid or free air. Musculoskeletal: Bilateral hip replacements. Postoperative changes in the lumbar spine. No acute bony abnormality. IMPRESSION:  No renal or ureteral stones.  No hydronephrosis. Scattered colonic diverticulosis.  No active diverticulitis. Large stool burden throughout the colon. Aortoiliac atherosclerosis. No acute findings in the abdomen or pelvis. Electronically Signed   By: Charlett Nose M.D.   On: 07/01/2023 17:35   DG Chest 1 View  Result Date: 07/01/2023 CLINICAL DATA:  Left-sided pain radiating into the throat EXAM: CHEST  1 VIEW COMPARISON:  Chest radiograph dated 06/10/2023 FINDINGS: Normal lung volumes. No focal consolidations. No pleural effusion or pneumothorax. The heart size and mediastinal contours are within normal limits. Median sternotomy wires are nondisplaced. Right shoulder arthroplasty. Partially imaged lumbar spinal fixation hardware appears intact. Bilateral breast implants. IMPRESSION: No active disease. Electronically Signed   By: Agustin Cree M.D.   On: 07/01/2023 16:49    Procedures Procedures    Medications Ordered in ED Medications  famotidine (PEPCID) tablet 20 mg (20 mg Oral Given 07/01/23 2119)  alum & mag hydroxide-simeth (MAALOX/MYLANTA) 200-200-20 MG/5ML suspension 30 mL (30 mLs Oral Given 07/01/23 2119)    ED Course/ Medical Decision Making/ A&P Clinical Course as of 07/01/23 2311  Wed Jul 01, 2023  2258 Troponin negative, symptoms have been ongoing for several days so single troponin is sufficient. [VK]  2307 Upon reassessment, the patient reports that her symptoms have improved.  She is stable for discharge home with outpatient follow-up and is given strict return precautions. [VK]    Clinical Course User Index [VK] Rexford Maus, DO                             Medical Decision Making This patient presents to the ED with chief complaint(s) of throat pain, left leg weakness with pertinent past medical history of CHF, COPD, DVT with IVC filter, recurrent GI bleeds, hypertension, hypothyroid which further complicates the presenting complaint. The complaint involves an extensive  differential diagnosis and also carries with it a high risk of complications and morbidity.    The differential diagnosis includes ACS, arrhythmia, anemia, pneumonia, pneumothorax, CVA, TIA, electrolyte abnormality, no evidence of PTA, RPA or other deep space infection, viral syndrome, gastritis, gastroenteritis, GERD  Additional history obtained: Additional history obtained from family Records reviewed Primary Care Documents  ED Course and Reassessment: On patient's arrival to the emergency department she is hemodynamically stable in no acute distress.  She is initially evaluated by provider in triage and had EKG, labs and MRI brain, CT stone search and chest x-ray performed.  Patient's EKG showed normal sinus rhythm without acute ischemic changes and labs thus far are within normal range.  Urinalysis is pending and she will additionally with troponin added on to evaluate for possible ACS.  MRI showed no acute strokes and she has no deficits on exam except for subjectively decreased sensation in the left leg.  She does have stool burden on CT stone search but otherwise no acute disease, chest x-ray is within normal range.  She will be trialed a GI cocktail and will be closely reassessed.  Independent labs interpretation:  The following labs were independently interpreted: within normal range  Independent visualization of imaging: - I independently visualized the  following imaging with scope of interpretation limited to determining acute life threatening conditions related to emergency care: MRI brain, CT stone search, CXR, which revealed no acute disease  Consultation: - Consulted or discussed management/test interpretation w/ external professional: N/A  Consideration for admission or further workup: Patient has no emergent conditions requiring admission or further work-up at this time and is stable for discharge home with primary care follow-up  Social Determinants of health:  N/A    Risk OTC drugs.          Final Clinical Impression(s) / ED Diagnoses Final diagnoses:  Left leg paresthesias  Globus sensation    Rx / DC Orders ED Discharge Orders          Ordered    famotidine (PEPCID) 20 MG tablet  2 times daily,   Status:  Discontinued        07/01/23 2308    alum & mag hydroxide-simeth (MAALOX MAX) 400-400-40 MG/5ML suspension  Every 6 hours PRN        07/01/23 2308    famotidine (PEPCID) 20 MG tablet  Daily        07/01/23 2309              Rexford Maus, DO 07/01/23 2311

## 2023-07-01 NOTE — ED Provider Triage Note (Signed)
Emergency Medicine Provider Triage Evaluation Note  [name] , a 82 y.o. female  was evaluated in triage.  Pt complains of flank pain and leg numbness.  Symptoms started Sunday.  Pain began in her left flank.  It feels sharp and at times radiates to the left side of her abdomen.  Later that day she started to feel weakness and numbness in her left leg making it difficult for her to walk.  She was seen by her caretaker today who informed her of the symptoms which prompted the care she should call 911.  Also endorses headache and dizziness with standing that improves when she sits down.  Denies chest pain shortness of breath.  Review of Systems  Positive: See above Negative: See above  Physical Exam  BP (!) 125/52 (BP Location: Right Arm)   Pulse 62   Temp 98.2 F (36.8 C) (Oral)   Resp 16   SpO2 94%  Gen:   Awake, no distress   Resp:  Normal effort  MSK:   Moves extremities without difficulty  Other:    Medical Decision Making  Medically screening exam initiated at 3:59 PM.  Appropriate orders placed.  [name] was informed that the remainder of the evaluation will be completed by another provider, this initial triage assessment does not replace that evaluation, and the importance of remaining in the ED until their evaluation is complete.  Work up started   Gareth Eagle, PA-C 07/01/23 1600

## 2023-07-01 NOTE — Discharge Instructions (Signed)
You were seen in the emergency department for your throat pain and your leg numbness.  Your workup showed no signs of stroke, heart attack or abnormal electrolytes.  Your symptoms did improve with antacid medication and it is possible that your throat pain could be related to acid reflux.  I have given you an antacid medicine called Pepcid that you should take daily for at least the next 2 weeks and you can take Maalox as needed.  You can follow-up with your primary doctor to have your symptoms rechecked.  You should return to the emergency department if you have significantly worsening pain, you are unable to swallow anything without vomiting, weakness and you are unable to walk, you are unable to urinate, you have weakness of the whole left side of your body or have difficulty speaking or if you have any other new or concerning symptoms.

## 2023-07-02 ENCOUNTER — Telehealth: Payer: Self-pay | Admitting: *Deleted

## 2023-07-02 NOTE — Telephone Encounter (Signed)
Pharmacy called related to Rx: famotidine sig .Marland KitchenMarland KitchenEDCM clarified with EDP to change Rx to: 1 tablet daily as written.

## 2023-07-03 DIAGNOSIS — I5022 Chronic systolic (congestive) heart failure: Secondary | ICD-10-CM | POA: Diagnosis not present

## 2023-07-03 DIAGNOSIS — N1832 Chronic kidney disease, stage 3b: Secondary | ICD-10-CM | POA: Diagnosis not present

## 2023-07-03 DIAGNOSIS — J441 Chronic obstructive pulmonary disease with (acute) exacerbation: Secondary | ICD-10-CM | POA: Diagnosis not present

## 2023-07-03 DIAGNOSIS — E1151 Type 2 diabetes mellitus with diabetic peripheral angiopathy without gangrene: Secondary | ICD-10-CM | POA: Diagnosis not present

## 2023-07-03 DIAGNOSIS — I5033 Acute on chronic diastolic (congestive) heart failure: Secondary | ICD-10-CM | POA: Diagnosis not present

## 2023-07-03 DIAGNOSIS — I130 Hypertensive heart and chronic kidney disease with heart failure and stage 1 through stage 4 chronic kidney disease, or unspecified chronic kidney disease: Secondary | ICD-10-CM | POA: Diagnosis not present

## 2023-07-03 DIAGNOSIS — E1122 Type 2 diabetes mellitus with diabetic chronic kidney disease: Secondary | ICD-10-CM | POA: Diagnosis not present

## 2023-07-03 DIAGNOSIS — J439 Emphysema, unspecified: Secondary | ICD-10-CM | POA: Diagnosis not present

## 2023-07-03 DIAGNOSIS — J9621 Acute and chronic respiratory failure with hypoxia: Secondary | ICD-10-CM | POA: Diagnosis not present

## 2023-07-07 DIAGNOSIS — I130 Hypertensive heart and chronic kidney disease with heart failure and stage 1 through stage 4 chronic kidney disease, or unspecified chronic kidney disease: Secondary | ICD-10-CM | POA: Diagnosis not present

## 2023-07-07 DIAGNOSIS — I5022 Chronic systolic (congestive) heart failure: Secondary | ICD-10-CM | POA: Diagnosis not present

## 2023-07-07 DIAGNOSIS — J439 Emphysema, unspecified: Secondary | ICD-10-CM | POA: Diagnosis not present

## 2023-07-07 DIAGNOSIS — E1122 Type 2 diabetes mellitus with diabetic chronic kidney disease: Secondary | ICD-10-CM | POA: Diagnosis not present

## 2023-07-07 DIAGNOSIS — N1832 Chronic kidney disease, stage 3b: Secondary | ICD-10-CM | POA: Diagnosis not present

## 2023-07-07 DIAGNOSIS — J441 Chronic obstructive pulmonary disease with (acute) exacerbation: Secondary | ICD-10-CM | POA: Diagnosis not present

## 2023-07-07 DIAGNOSIS — J9621 Acute and chronic respiratory failure with hypoxia: Secondary | ICD-10-CM | POA: Diagnosis not present

## 2023-07-07 DIAGNOSIS — I5033 Acute on chronic diastolic (congestive) heart failure: Secondary | ICD-10-CM | POA: Diagnosis not present

## 2023-07-07 DIAGNOSIS — E1151 Type 2 diabetes mellitus with diabetic peripheral angiopathy without gangrene: Secondary | ICD-10-CM | POA: Diagnosis not present

## 2023-07-08 ENCOUNTER — Other Ambulatory Visit: Payer: Self-pay | Admitting: Internal Medicine

## 2023-07-08 MED ORDER — DAPAGLIFLOZIN PROPANEDIOL 10 MG PO TABS: 10.0000 mg | ORAL_TABLET | Freq: Every day | ORAL | 2 refills | Status: DC

## 2023-07-10 DIAGNOSIS — J9621 Acute and chronic respiratory failure with hypoxia: Secondary | ICD-10-CM | POA: Diagnosis not present

## 2023-07-10 DIAGNOSIS — J441 Chronic obstructive pulmonary disease with (acute) exacerbation: Secondary | ICD-10-CM | POA: Diagnosis not present

## 2023-07-10 DIAGNOSIS — I5033 Acute on chronic diastolic (congestive) heart failure: Secondary | ICD-10-CM | POA: Diagnosis not present

## 2023-07-10 DIAGNOSIS — E1151 Type 2 diabetes mellitus with diabetic peripheral angiopathy without gangrene: Secondary | ICD-10-CM | POA: Diagnosis not present

## 2023-07-10 DIAGNOSIS — I130 Hypertensive heart and chronic kidney disease with heart failure and stage 1 through stage 4 chronic kidney disease, or unspecified chronic kidney disease: Secondary | ICD-10-CM | POA: Diagnosis not present

## 2023-07-10 DIAGNOSIS — E1122 Type 2 diabetes mellitus with diabetic chronic kidney disease: Secondary | ICD-10-CM | POA: Diagnosis not present

## 2023-07-10 DIAGNOSIS — N1832 Chronic kidney disease, stage 3b: Secondary | ICD-10-CM | POA: Diagnosis not present

## 2023-07-10 DIAGNOSIS — I5022 Chronic systolic (congestive) heart failure: Secondary | ICD-10-CM | POA: Diagnosis not present

## 2023-07-10 DIAGNOSIS — J439 Emphysema, unspecified: Secondary | ICD-10-CM | POA: Diagnosis not present

## 2023-07-14 DIAGNOSIS — S8002XA Contusion of left knee, initial encounter: Secondary | ICD-10-CM | POA: Diagnosis not present

## 2023-07-14 DIAGNOSIS — W19XXXA Unspecified fall, initial encounter: Secondary | ICD-10-CM | POA: Diagnosis not present

## 2023-07-14 DIAGNOSIS — S0101XA Laceration without foreign body of scalp, initial encounter: Secondary | ICD-10-CM | POA: Diagnosis not present

## 2023-07-14 DIAGNOSIS — M25562 Pain in left knee: Secondary | ICD-10-CM | POA: Diagnosis not present

## 2023-07-14 DIAGNOSIS — R58 Hemorrhage, not elsewhere classified: Secondary | ICD-10-CM | POA: Diagnosis not present

## 2023-07-14 DIAGNOSIS — M799 Soft tissue disorder, unspecified: Secondary | ICD-10-CM | POA: Diagnosis not present

## 2023-07-14 DIAGNOSIS — S0990XA Unspecified injury of head, initial encounter: Secondary | ICD-10-CM | POA: Diagnosis not present

## 2023-07-14 DIAGNOSIS — R609 Edema, unspecified: Secondary | ICD-10-CM | POA: Diagnosis not present

## 2023-07-14 DIAGNOSIS — S0003XA Contusion of scalp, initial encounter: Secondary | ICD-10-CM | POA: Diagnosis not present

## 2023-07-14 DIAGNOSIS — Z471 Aftercare following joint replacement surgery: Secondary | ICD-10-CM | POA: Diagnosis not present

## 2023-07-14 DIAGNOSIS — S51012A Laceration without foreign body of left elbow, initial encounter: Secondary | ICD-10-CM | POA: Diagnosis not present

## 2023-07-14 DIAGNOSIS — M25522 Pain in left elbow: Secondary | ICD-10-CM | POA: Diagnosis not present

## 2023-07-14 DIAGNOSIS — Z96652 Presence of left artificial knee joint: Secondary | ICD-10-CM | POA: Diagnosis not present

## 2023-07-14 DIAGNOSIS — R42 Dizziness and giddiness: Secondary | ICD-10-CM | POA: Diagnosis not present

## 2023-07-15 ENCOUNTER — Ambulatory Visit (HOSPITAL_COMMUNITY): Payer: Medicare HMO

## 2023-07-16 DIAGNOSIS — E1122 Type 2 diabetes mellitus with diabetic chronic kidney disease: Secondary | ICD-10-CM | POA: Diagnosis not present

## 2023-07-16 DIAGNOSIS — J439 Emphysema, unspecified: Secondary | ICD-10-CM | POA: Diagnosis not present

## 2023-07-16 DIAGNOSIS — J441 Chronic obstructive pulmonary disease with (acute) exacerbation: Secondary | ICD-10-CM | POA: Diagnosis not present

## 2023-07-16 DIAGNOSIS — I130 Hypertensive heart and chronic kidney disease with heart failure and stage 1 through stage 4 chronic kidney disease, or unspecified chronic kidney disease: Secondary | ICD-10-CM | POA: Diagnosis not present

## 2023-07-16 DIAGNOSIS — I5022 Chronic systolic (congestive) heart failure: Secondary | ICD-10-CM | POA: Diagnosis not present

## 2023-07-16 DIAGNOSIS — J9621 Acute and chronic respiratory failure with hypoxia: Secondary | ICD-10-CM | POA: Diagnosis not present

## 2023-07-16 DIAGNOSIS — I5033 Acute on chronic diastolic (congestive) heart failure: Secondary | ICD-10-CM | POA: Diagnosis not present

## 2023-07-16 DIAGNOSIS — E1151 Type 2 diabetes mellitus with diabetic peripheral angiopathy without gangrene: Secondary | ICD-10-CM | POA: Diagnosis not present

## 2023-07-16 DIAGNOSIS — N1832 Chronic kidney disease, stage 3b: Secondary | ICD-10-CM | POA: Diagnosis not present

## 2023-07-17 ENCOUNTER — Other Ambulatory Visit: Payer: Self-pay | Admitting: Internal Medicine

## 2023-07-17 DIAGNOSIS — E1122 Type 2 diabetes mellitus with diabetic chronic kidney disease: Secondary | ICD-10-CM | POA: Diagnosis not present

## 2023-07-17 DIAGNOSIS — N1832 Chronic kidney disease, stage 3b: Secondary | ICD-10-CM | POA: Diagnosis not present

## 2023-07-17 DIAGNOSIS — I5022 Chronic systolic (congestive) heart failure: Secondary | ICD-10-CM | POA: Diagnosis not present

## 2023-07-17 DIAGNOSIS — J439 Emphysema, unspecified: Secondary | ICD-10-CM | POA: Diagnosis not present

## 2023-07-17 DIAGNOSIS — I130 Hypertensive heart and chronic kidney disease with heart failure and stage 1 through stage 4 chronic kidney disease, or unspecified chronic kidney disease: Secondary | ICD-10-CM | POA: Diagnosis not present

## 2023-07-17 DIAGNOSIS — J441 Chronic obstructive pulmonary disease with (acute) exacerbation: Secondary | ICD-10-CM | POA: Diagnosis not present

## 2023-07-17 DIAGNOSIS — I5033 Acute on chronic diastolic (congestive) heart failure: Secondary | ICD-10-CM | POA: Diagnosis not present

## 2023-07-17 DIAGNOSIS — E1151 Type 2 diabetes mellitus with diabetic peripheral angiopathy without gangrene: Secondary | ICD-10-CM | POA: Diagnosis not present

## 2023-07-17 DIAGNOSIS — J9621 Acute and chronic respiratory failure with hypoxia: Secondary | ICD-10-CM | POA: Diagnosis not present

## 2023-07-21 DIAGNOSIS — J9611 Chronic respiratory failure with hypoxia: Secondary | ICD-10-CM | POA: Diagnosis not present

## 2023-07-21 DIAGNOSIS — S0101XD Laceration without foreign body of scalp, subsequent encounter: Secondary | ICD-10-CM | POA: Diagnosis not present

## 2023-07-21 DIAGNOSIS — I130 Hypertensive heart and chronic kidney disease with heart failure and stage 1 through stage 4 chronic kidney disease, or unspecified chronic kidney disease: Secondary | ICD-10-CM | POA: Diagnosis not present

## 2023-07-21 DIAGNOSIS — S8002XD Contusion of left knee, subsequent encounter: Secondary | ICD-10-CM | POA: Diagnosis not present

## 2023-07-21 DIAGNOSIS — E1122 Type 2 diabetes mellitus with diabetic chronic kidney disease: Secondary | ICD-10-CM | POA: Diagnosis not present

## 2023-07-21 DIAGNOSIS — S41102D Unspecified open wound of left upper arm, subsequent encounter: Secondary | ICD-10-CM | POA: Diagnosis not present

## 2023-07-21 DIAGNOSIS — I5022 Chronic systolic (congestive) heart failure: Secondary | ICD-10-CM | POA: Diagnosis not present

## 2023-07-21 DIAGNOSIS — J439 Emphysema, unspecified: Secondary | ICD-10-CM | POA: Diagnosis not present

## 2023-07-21 DIAGNOSIS — I5032 Chronic diastolic (congestive) heart failure: Secondary | ICD-10-CM | POA: Diagnosis not present

## 2023-07-22 ENCOUNTER — Other Ambulatory Visit: Payer: Self-pay | Admitting: Internal Medicine

## 2023-07-22 MED ORDER — XTAMPZA ER 9 MG PO C12A: 9.0000 mg | EXTENDED_RELEASE_CAPSULE | Freq: Three times a day (TID) | ORAL | 0 refills | Status: DC

## 2023-07-23 DIAGNOSIS — J439 Emphysema, unspecified: Secondary | ICD-10-CM | POA: Diagnosis not present

## 2023-07-23 DIAGNOSIS — J9611 Chronic respiratory failure with hypoxia: Secondary | ICD-10-CM | POA: Diagnosis not present

## 2023-07-23 DIAGNOSIS — I130 Hypertensive heart and chronic kidney disease with heart failure and stage 1 through stage 4 chronic kidney disease, or unspecified chronic kidney disease: Secondary | ICD-10-CM | POA: Diagnosis not present

## 2023-07-23 DIAGNOSIS — S0101XD Laceration without foreign body of scalp, subsequent encounter: Secondary | ICD-10-CM | POA: Diagnosis not present

## 2023-07-23 DIAGNOSIS — I5032 Chronic diastolic (congestive) heart failure: Secondary | ICD-10-CM | POA: Diagnosis not present

## 2023-07-23 DIAGNOSIS — E1122 Type 2 diabetes mellitus with diabetic chronic kidney disease: Secondary | ICD-10-CM | POA: Diagnosis not present

## 2023-07-23 DIAGNOSIS — S41102D Unspecified open wound of left upper arm, subsequent encounter: Secondary | ICD-10-CM | POA: Diagnosis not present

## 2023-07-23 DIAGNOSIS — I5022 Chronic systolic (congestive) heart failure: Secondary | ICD-10-CM | POA: Diagnosis not present

## 2023-07-23 DIAGNOSIS — S8002XD Contusion of left knee, subsequent encounter: Secondary | ICD-10-CM | POA: Diagnosis not present

## 2023-07-27 ENCOUNTER — Ambulatory Visit: Payer: Medicare HMO | Admitting: Internal Medicine

## 2023-07-27 ENCOUNTER — Encounter: Payer: Self-pay | Admitting: Internal Medicine

## 2023-07-27 VITALS — BP 110/64 | HR 67 | Temp 98.4°F | Resp 16 | Ht 67.0 in | Wt 156.4 lb

## 2023-07-27 DIAGNOSIS — R1310 Dysphagia, unspecified: Secondary | ICD-10-CM

## 2023-07-27 DIAGNOSIS — I130 Hypertensive heart and chronic kidney disease with heart failure and stage 1 through stage 4 chronic kidney disease, or unspecified chronic kidney disease: Secondary | ICD-10-CM | POA: Diagnosis not present

## 2023-07-27 DIAGNOSIS — S51012A Laceration without foreign body of left elbow, initial encounter: Secondary | ICD-10-CM | POA: Insufficient documentation

## 2023-07-27 DIAGNOSIS — S41102D Unspecified open wound of left upper arm, subsequent encounter: Secondary | ICD-10-CM | POA: Diagnosis not present

## 2023-07-27 DIAGNOSIS — E1122 Type 2 diabetes mellitus with diabetic chronic kidney disease: Secondary | ICD-10-CM | POA: Diagnosis not present

## 2023-07-27 DIAGNOSIS — S51012D Laceration without foreign body of left elbow, subsequent encounter: Secondary | ICD-10-CM | POA: Diagnosis not present

## 2023-07-27 DIAGNOSIS — S0101XD Laceration without foreign body of scalp, subsequent encounter: Secondary | ICD-10-CM | POA: Diagnosis not present

## 2023-07-27 DIAGNOSIS — J9611 Chronic respiratory failure with hypoxia: Secondary | ICD-10-CM | POA: Diagnosis not present

## 2023-07-27 DIAGNOSIS — I5032 Chronic diastolic (congestive) heart failure: Secondary | ICD-10-CM | POA: Diagnosis not present

## 2023-07-27 DIAGNOSIS — I5022 Chronic systolic (congestive) heart failure: Secondary | ICD-10-CM | POA: Diagnosis not present

## 2023-07-27 DIAGNOSIS — S8002XD Contusion of left knee, subsequent encounter: Secondary | ICD-10-CM | POA: Diagnosis not present

## 2023-07-27 DIAGNOSIS — J439 Emphysema, unspecified: Secondary | ICD-10-CM | POA: Diagnosis not present

## 2023-07-27 MED ORDER — PANTOPRAZOLE SODIUM 40 MG PO TBEC: 40.0000 mg | DELAYED_RELEASE_TABLET | Freq: Two times a day (BID) | ORAL | 0 refills | Status: DC

## 2023-07-27 MED ORDER — PANTOPRAZOLE SODIUM 40 MG PO TBEC: 40.0000 mg | DELAYED_RELEASE_TABLET | Freq: Every day | ORAL | 3 refills | Status: DC

## 2023-07-27 NOTE — Progress Notes (Signed)
Office Visit  Subjective   Patient ID: [name]   DOB: 04/03/1941   Age: 82 y.o.   MRN: [id number]   Chief Complaint Chief Complaint  Patient presents with   Follow-up     History of Present Illness [name] is a 82 yo female who comes in today for an ER followup.  She was seen in the [facility] ER on 07/14/2023 due to a fall.  She was standing in her kitchen when she turned around and fell.  She initially hit her knee first and then she hit her left forehead on the floor.  There was no LOC.  She states she doesn't remember if she got weak or if she tripped but she is not sure of the mechanism of the fall.  She hit her left knee, left elbow and her forehead and sustained a skin tear of her left elbow.  They did a CT scan of her head and c-spine which showed no acute intracranial abnormality. There was a left frontal scalp laceration where they glued this.  There was no acute cervical spine fracture or traumatic listhesis.  She had skin tear of her left elbow that they wrapped.  [facility] sent out a nurse today where she was previously on PT/OT and they sent a wound care nurse today to look at this wound.    The patient also states she is having problems with swallowing.  This started about 2 weeks ago and involves both solids and liquids.  The patient started having bad reflux about 2 weeks ago where she is on protonix 40mg  daily.  She had an EGD done in 01/2017 which showed mild erosive gastritis and multiple small bowel AVM's.      Past Medical History Past Medical History:  Diagnosis Date   Anemia    bld. transfusion post lumbar surgery- 2012   Anxiety    Arthralgia    NOS   CAD (coronary artery disease)    s/p CABG 2004; s/p DES to LM in 2010;  LHC 10/29/11: EF 50-55%, mild elevated filling pressures, no pulmonary HTN, LM 90% ISR, LAD and CFX occluded, S-RI occluded (old), S-OM3 ok and L-LAD ok, native nondominant RCA 95% -  med rx recommended ; Lexiscan Myoview 7/13 at [facility]: demonstrated "normal LV function, anterior attenuation and localized ischemia, inferior, basilar, mid section"   Carotid artery disease (HCC)    Carotid US 6/19:  RICA 1-39; LICA 40-59; R subclavian stenosis - Repeat 1 year. // Carotid US 05/2019: R 1-39; L 40-59; R vertebral with atypical antegrade flow; R subclavian stenosis >> repeat 1 year // Carotid US 7/21: Bilat 40-59; L subclavian stenosis // Carotid US 7/22: Bilat ICA 40-59; R subclavian stenosis   CHF (congestive heart failure) (HCC) 01/21/2020   Chronic diastolic heart failure (HCC)    Echo 9/10: EF 60-55%, grade 1 diastolic dysfunction   Chronic lower back pain    Chronic respiratory failure (HCC)    Cirrhosis (HCC)    CKD (chronic kidney disease), stage III (HCC)    COPD (chronic obstructive pulmonary disease) (HCC)    Emphysema dxed by Dr. Sherril Croon in [place] based on PFTs per pt in 2006; placed on albuterol   Degenerative disc disease, lumbar    Depression    TAKES CELEXA  AND  (OFF- WELBUTRIN)   Diuretic-induced hypokalemia 10/08/2018   DVT of lower extremity (deep venous thrombosis) (HCC)    recurrent. bilateral (2 episodes)   Dyspnea  home o2 when needed   Dysrhythmia    afib with cabg   Exertional angina    Treated with Isosorbide, Ranexa, amlodipine; intolerant to metoprolol   GERD (gastroesophageal reflux disease)    Gout    "on daily RX" (10/06/2018)   HLD (hyperlipidemia)    Hyperlipidemia    Hypertension    Hypothyroidism    L Subclavian Artery Stenosis    Carotid US 7/21    Obesity (BMI 30-39.9) 2009   BMI 33   On home oxygen therapy    OSA (obstructive sleep apnea)    Osteoarthritis    "all over; hands" (10/06/2018)   Osteoporosis    Oxygen dependent    4L at home as needed (10/06/2018)   PVD (peripheral vascular disease) (HCC)    Restless leg syndrome    Vitamin D deficiency      Allergies Allergies  Allergen Reactions   Ativan [Lorazepam] Other (See Comments)    Confusion     Pitavastatin Itching and Other (See Comments)    Reaction to Livalo   Ropinirole Nausea Only and Other (See Comments)    Makes legs jump, also   Zofran [Ondansetron Hcl] Nausea Only   Zolpidem Tartrate Anxiety and Other (See Comments)    CONFUSION    Zolpidem Tartrate Swelling    CONFUSION   Penicillins Rash and Other (See Comments)    Tolerated cephalosporins in past Has patient had a PCN reaction causing immediate rash, facial/tongue/throat swelling, SOB or lightheadedness with hypotension: Yes Has patient had a PCN reaction causing severe rash involving mucus membranes or skin necrosis: No Has patient had a PCN reaction that required hospitalization: No Has patient had a PCN reaction occurring within the last 10 years: No If all of the above answers are "NO", then may proceed with Cephalosporin use.      Medications  Current Outpatient Medications:    acetaminophen (TYLENOL) 500 MG tablet, Take 1,000 mg by mouth daily., Disp: , Rfl:    albuterol (PROVENTIL) (2.5 MG/3ML) 0.083% nebulizer solution, Take 2.5 mg by nebulization every 6 (six) hours as needed for wheezing or shortness of breath., Disp: , Rfl:    allopurinol (ZYLOPRIM) 100 MG tablet, TAKE 1 TABLET BY MOUTH DAILY WITH BREAKFAST., Disp: 90 tablet, Rfl: 0   alum & mag hydroxide-simeth (MAALOX MAX) 400-400-40 MG/5ML suspension, Take 15 mLs by mouth every 6 (six) hours as needed for indigestion., Disp: 355 mL, Rfl: 0   amLODipine (NORVASC) 2.5 MG tablet, Take 1/2 tablet as needed for BP greater than 160., Disp: 30 tablet, Rfl: 1   ARIPiprazole (ABILIFY) 2 MG tablet, TAKE 1 TABLET BY MOUTH ONCE DAILY, Disp: 90 tablet, Rfl: 0   aspirin EC 81 MG tablet, Take 81 mg by mouth daily with breakfast. Swallow whole., Disp: , Rfl:    atorvastatin (LIPITOR) 20 MG tablet, TAKE 1 TABLET ONCE DAILY AT BEDTIME (Patient taking differently: Take 20 mg by mouth daily.), Disp: 90 tablet, Rfl: 0   cholecalciferol (VITAMIN D3) 25 MCG (1000 UNIT)  tablet, Take 1 tablet (1,000 Units total) by mouth daily with supper., Disp: 90 tablet, Rfl: 1   citalopram (CELEXA) 10 MG tablet, TAKE 1 TABLET BY MOUTH ONCE DAILY, Disp: 90 tablet, Rfl: 0   cyclobenzaprine (FLEXERIL) 10 MG tablet, Take 10 mg by mouth 3 (three) times daily as needed for muscle spasms., Disp: , Rfl:    D3-1000 25 MCG (1000 UT) capsule, TAKE 1 CAPSULE BY MOUTH DAILY WITH SUPPER., Disp: 90 capsule,  Rfl: 0   dapagliflozin propanediol (FARXIGA) 10 MG TABS tablet, Take 1 tablet (10 mg total) by mouth daily before breakfast., Disp: 90 tablet, Rfl: 2   Docusate Sodium (DSS) 100 MG CAPS, Take 100 mg by mouth daily as needed (constipation)., Disp: , Rfl:    donepezil (ARICEPT) 10 MG tablet, TAKE 1 TABLET BY MOUTH AT BEDTIME., Disp: 30 tablet, Rfl: 0   donepezil (ARICEPT) 5 MG tablet, Take 1 tablet (5 mg total) by mouth at bedtime., Disp: 30 tablet, Rfl: 0   famotidine (PEPCID) 20 MG tablet, Take 1 tablet (20 mg total) by mouth daily., Disp: 30 tablet, Rfl: 0   feeding supplement (ENSURE ENLIVE / ENSURE PLUS) LIQD, Take 237 mLs by mouth 2 (two) times daily between meals., Disp: , Rfl:    ferrous sulfate 325 (65 FE) MG tablet, TAKE 1 TABLET 2 TIMES PER DAY (Patient taking differently: Take 325 mg by mouth 2 (two) times daily.), Disp: 180 tablet, Rfl: 0   fluconazole (DIFLUCAN) 150 MG tablet, Take 1 tablet (150 mg total) by mouth every other day., Disp: 4 tablet, Rfl: 0   fluticasone-salmeterol (ADVAIR DISKUS) 250-50 MCG/ACT AEPB, Inhale 1 puff into the lungs in the morning and at bedtime., Disp: 1 each, Rfl: 5   Fluticasone-Umeclidin-Vilant (TRELEGY ELLIPTA) 200-62.5-25 MCG/ACT AEPB, Inhale 1 puff into the lungs daily., Disp: 1 each, Rfl: 5   furosemide (LASIX) 80 MG tablet, Take 1 tablet (80 mg total) by mouth every other day. PLEASE RESUME THIS MEDICATION ONCE YOU ARE EATING AND DRINKING BETTER., Disp: , Rfl:    gabapentin (NEURONTIN) 100 MG capsule, TAKE 2 CAPSULES BY MOUTH TWICE DAILY  (Patient taking differently: Take 100 mg by mouth 2 (two) times daily. Take 1 capsule by mouth in the Morning, and 2 at Night), Disp: 360 capsule, Rfl: 2   Ipratropium-Albuterol (COMBIVENT RESPIMAT) 20-100 MCG/ACT AERS respimat, Inhale 1 puff into the lungs 3 (three) times daily as needed for wheezing or shortness of breath., Disp: , Rfl:    levofloxacin (LEVAQUIN) 750 MG tablet, Take 1 tablet (750 mg total) by mouth every other day., Disp: 4 tablet, Rfl: 0   levothyroxine (SYNTHROID) 50 MCG tablet, TAKE 1 TABLET BY MOUTH DAILY BEFORE BREAKFAST., Disp: 90 tablet, Rfl: 3   Magnesium Oxide -Mg Supplement 500 MG CAPS, TAKE 1 CAPSULE BY MOUTH DAILY, Disp: 90 capsule, Rfl: 0   Melatonin 10 MG TABS, Take 10 mg by mouth at bedtime., Disp: , Rfl:    metolazone (ZAROXOLYN) 2.5 MG tablet, Take 1 tablet (2.5 mg total) by mouth every 14 (fourteen) days., Disp: 30 tablet, Rfl: 5   metoprolol tartrate (LOPRESSOR) 25 MG tablet, TAKE 1/2 TABLET BY MOUTH TWICE DAILY, Disp: 15 tablet, Rfl: 1   Multiple Vitamin (MULTIVITAMIN WITH MINERALS) TABS tablet, Take 1 tablet by mouth daily., Disp: , Rfl:    nitroGLYCERIN (NITROSTAT) 0.4 MG SL tablet, Place 1 tablet (0.4 mg total) under the tongue every 5 (five) minutes as needed for chest pain. For chest pain, Disp: 25 tablet, Rfl: 3   OXYGEN, Inhale 4 L/min into the lungs as needed (DURING ALL TIMES OF EXERTION). , Disp: , Rfl:    pantoprazole (PROTONIX) 40 MG tablet, TAKE 1 TABLET ONCE DAILY, Disp: 90 tablet, Rfl: 2   pilocarpine (PILOCAR) 1 % ophthalmic solution, Place 1 drop into both eyes 2 (two) times daily., Disp: , Rfl:    polyethylene glycol (MIRALAX / GLYCOLAX) 17 g packet, Take 17 g by mouth as needed., Disp: ,  Rfl:    potassium chloride SA (KLOR-CON M) 20 MEQ tablet, TAKE 1 TABLET ONCE DAILY WITH FOOD, Disp: 90 tablet, Rfl: 0   pramipexole (MIRAPEX) 0.25 MG tablet, TAKE 1 TABLET 2-3 HOURS BEFORE BEDTIME, Disp: 90 tablet, Rfl: 0   predniSONE (DELTASONE) 10 MG tablet,  Day 1-3: take 4 tabs po daily, Day 4-6: take 3 tabs po daily, Day 7-9: take 2 tabs po daily, Day 10-12: take 1 tab po daily, Disp: 30 tablet, Rfl: 0   roflumilast (DALIRESP) 500 MCG TABS tablet, TAKE 1 TABLET ONCE DAILY, Disp: 90 tablet, Rfl: 0   spironolactone (ALDACTONE) 25 MG tablet, Take 1 tablet (25 mg total) by mouth daily. PLEASE RESUME THIS MEDICATION ONCE YOU ARE EATING AND DRINKING BETTER., Disp: 90 tablet, Rfl: 2   traZODone (DESYREL) 50 MG tablet, TAKE 3 TABLETS EVERY NIGHT AT BEDTIME., Disp: 270 tablet, Rfl: 0   vitamin C (ASCORBIC ACID) 500 MG tablet, Take 500 mg by mouth daily with lunch., Disp: , Rfl:    XTAMPZA ER 9 MG C12A, Take 9 mg by mouth in the morning, at noon, and at bedtime., Disp: 90 capsule, Rfl: 0   Review of Systems Review of Systems  Constitutional:  Negative for chills and fever.  Eyes:  Negative for blurred vision and double vision.  Respiratory:  Positive for shortness of breath.   Cardiovascular:  Negative for chest pain and palpitations.  Musculoskeletal:  Negative for back pain, myalgias and neck pain.  Neurological:  Positive for headaches.  Psychiatric/Behavioral:  Negative for depression and memory loss.        No mood changes.       Objective:    Vitals BP 110/64   Pulse 67   Temp 98.4 F (36.9 C)   Resp 16   Ht 5\' 7"  (1.702 m)   Wt 156 lb 6.4 oz (70.9 kg)   SpO2 97%   BMI 24.50 kg/m    Physical Examination Physical Exam Constitutional:      Appearance: Normal appearance. She is not ill-appearing.  Cardiovascular:     Rate and Rhythm: Normal rate and regular rhythm.     Pulses: Normal pulses.     Heart sounds: No murmur heard.    No friction rub. No gallop.  Pulmonary:     Effort: Pulmonary effort is normal. No respiratory distress.     Breath sounds: No wheezing, rhonchi or rales.  Abdominal:     General: Bowel sounds are normal. There is no distension.     Palpations: Abdomen is soft.     Tenderness: There is no abdominal  tenderness.  Musculoskeletal:     Right lower leg: No edema.     Left lower leg: No edema.  Skin:    General: Skin is warm and dry.     Findings: No rash.     Comments: She has 2 skin tears to her right elbow, one to lateral side more in the center that is an open skin tear without erythema or drainage.  There is another that has a developing black eschar superior to this one both about the size of a dime/quarter.  Neurological:     Mental Status: She is alert.        Assessment & Plan:   Skin tear of left elbow without complication This is healing well without signs of infection.  I asked her to keep the superior skin tear lesion open to the air and continue to cover the lower lesion.  Dysphagia She is having dysphagia.  I want her to start taking protonix 40mg  BID instead of once per day.  We will refer her to GI for evaluation for EGD.    No follow-ups on file.   Crist Fat, MD

## 2023-07-27 NOTE — Assessment & Plan Note (Signed)
This is healing well without signs of infection.  I asked her to keep the superior skin tear lesion open to the air and continue to cover the lower lesion.

## 2023-07-27 NOTE — Assessment & Plan Note (Signed)
She is having dysphagia.  I want her to start taking protonix 40mg  BID instead of once per day.  We will refer her to GI for evaluation for EGD.

## 2023-07-28 DIAGNOSIS — S0101XD Laceration without foreign body of scalp, subsequent encounter: Secondary | ICD-10-CM | POA: Diagnosis not present

## 2023-07-28 DIAGNOSIS — I5022 Chronic systolic (congestive) heart failure: Secondary | ICD-10-CM | POA: Diagnosis not present

## 2023-07-28 DIAGNOSIS — I5032 Chronic diastolic (congestive) heart failure: Secondary | ICD-10-CM | POA: Diagnosis not present

## 2023-07-28 DIAGNOSIS — S41102D Unspecified open wound of left upper arm, subsequent encounter: Secondary | ICD-10-CM | POA: Diagnosis not present

## 2023-07-28 DIAGNOSIS — J9611 Chronic respiratory failure with hypoxia: Secondary | ICD-10-CM | POA: Diagnosis not present

## 2023-07-28 DIAGNOSIS — S8002XD Contusion of left knee, subsequent encounter: Secondary | ICD-10-CM | POA: Diagnosis not present

## 2023-07-28 DIAGNOSIS — E1122 Type 2 diabetes mellitus with diabetic chronic kidney disease: Secondary | ICD-10-CM | POA: Diagnosis not present

## 2023-07-28 DIAGNOSIS — I130 Hypertensive heart and chronic kidney disease with heart failure and stage 1 through stage 4 chronic kidney disease, or unspecified chronic kidney disease: Secondary | ICD-10-CM | POA: Diagnosis not present

## 2023-07-28 DIAGNOSIS — J439 Emphysema, unspecified: Secondary | ICD-10-CM | POA: Diagnosis not present

## 2023-07-30 ENCOUNTER — Ambulatory Visit (HOSPITAL_COMMUNITY): Payer: Medicare HMO

## 2023-07-31 DIAGNOSIS — E1122 Type 2 diabetes mellitus with diabetic chronic kidney disease: Secondary | ICD-10-CM | POA: Diagnosis not present

## 2023-07-31 DIAGNOSIS — I5022 Chronic systolic (congestive) heart failure: Secondary | ICD-10-CM | POA: Diagnosis not present

## 2023-07-31 DIAGNOSIS — S0101XD Laceration without foreign body of scalp, subsequent encounter: Secondary | ICD-10-CM | POA: Diagnosis not present

## 2023-07-31 DIAGNOSIS — J439 Emphysema, unspecified: Secondary | ICD-10-CM | POA: Diagnosis not present

## 2023-07-31 DIAGNOSIS — I130 Hypertensive heart and chronic kidney disease with heart failure and stage 1 through stage 4 chronic kidney disease, or unspecified chronic kidney disease: Secondary | ICD-10-CM | POA: Diagnosis not present

## 2023-07-31 DIAGNOSIS — S41102D Unspecified open wound of left upper arm, subsequent encounter: Secondary | ICD-10-CM | POA: Diagnosis not present

## 2023-07-31 DIAGNOSIS — J9611 Chronic respiratory failure with hypoxia: Secondary | ICD-10-CM | POA: Diagnosis not present

## 2023-07-31 DIAGNOSIS — S8002XD Contusion of left knee, subsequent encounter: Secondary | ICD-10-CM | POA: Diagnosis not present

## 2023-07-31 DIAGNOSIS — I5032 Chronic diastolic (congestive) heart failure: Secondary | ICD-10-CM | POA: Diagnosis not present

## 2023-08-03 DIAGNOSIS — S41102D Unspecified open wound of left upper arm, subsequent encounter: Secondary | ICD-10-CM | POA: Diagnosis not present

## 2023-08-03 DIAGNOSIS — I5022 Chronic systolic (congestive) heart failure: Secondary | ICD-10-CM | POA: Diagnosis not present

## 2023-08-03 DIAGNOSIS — E1122 Type 2 diabetes mellitus with diabetic chronic kidney disease: Secondary | ICD-10-CM | POA: Diagnosis not present

## 2023-08-03 DIAGNOSIS — I130 Hypertensive heart and chronic kidney disease with heart failure and stage 1 through stage 4 chronic kidney disease, or unspecified chronic kidney disease: Secondary | ICD-10-CM | POA: Diagnosis not present

## 2023-08-03 DIAGNOSIS — I5032 Chronic diastolic (congestive) heart failure: Secondary | ICD-10-CM | POA: Diagnosis not present

## 2023-08-03 DIAGNOSIS — J439 Emphysema, unspecified: Secondary | ICD-10-CM | POA: Diagnosis not present

## 2023-08-03 DIAGNOSIS — S0101XD Laceration without foreign body of scalp, subsequent encounter: Secondary | ICD-10-CM | POA: Diagnosis not present

## 2023-08-03 DIAGNOSIS — J9611 Chronic respiratory failure with hypoxia: Secondary | ICD-10-CM | POA: Diagnosis not present

## 2023-08-03 DIAGNOSIS — S8002XD Contusion of left knee, subsequent encounter: Secondary | ICD-10-CM | POA: Diagnosis not present

## 2023-08-06 ENCOUNTER — Encounter: Payer: Self-pay | Admitting: Internal Medicine

## 2023-08-06 DIAGNOSIS — E1122 Type 2 diabetes mellitus with diabetic chronic kidney disease: Secondary | ICD-10-CM | POA: Diagnosis not present

## 2023-08-06 DIAGNOSIS — S0101XD Laceration without foreign body of scalp, subsequent encounter: Secondary | ICD-10-CM | POA: Diagnosis not present

## 2023-08-06 DIAGNOSIS — S41102D Unspecified open wound of left upper arm, subsequent encounter: Secondary | ICD-10-CM | POA: Diagnosis not present

## 2023-08-06 DIAGNOSIS — I5022 Chronic systolic (congestive) heart failure: Secondary | ICD-10-CM | POA: Diagnosis not present

## 2023-08-06 DIAGNOSIS — I5032 Chronic diastolic (congestive) heart failure: Secondary | ICD-10-CM | POA: Diagnosis not present

## 2023-08-06 DIAGNOSIS — J9611 Chronic respiratory failure with hypoxia: Secondary | ICD-10-CM | POA: Diagnosis not present

## 2023-08-06 DIAGNOSIS — I130 Hypertensive heart and chronic kidney disease with heart failure and stage 1 through stage 4 chronic kidney disease, or unspecified chronic kidney disease: Secondary | ICD-10-CM | POA: Diagnosis not present

## 2023-08-06 DIAGNOSIS — J439 Emphysema, unspecified: Secondary | ICD-10-CM | POA: Diagnosis not present

## 2023-08-06 DIAGNOSIS — S8002XD Contusion of left knee, subsequent encounter: Secondary | ICD-10-CM | POA: Diagnosis not present

## 2023-08-10 DIAGNOSIS — I130 Hypertensive heart and chronic kidney disease with heart failure and stage 1 through stage 4 chronic kidney disease, or unspecified chronic kidney disease: Secondary | ICD-10-CM | POA: Diagnosis not present

## 2023-08-10 DIAGNOSIS — I5022 Chronic systolic (congestive) heart failure: Secondary | ICD-10-CM | POA: Diagnosis not present

## 2023-08-10 DIAGNOSIS — J439 Emphysema, unspecified: Secondary | ICD-10-CM | POA: Diagnosis not present

## 2023-08-10 DIAGNOSIS — E1122 Type 2 diabetes mellitus with diabetic chronic kidney disease: Secondary | ICD-10-CM | POA: Diagnosis not present

## 2023-08-10 DIAGNOSIS — I5032 Chronic diastolic (congestive) heart failure: Secondary | ICD-10-CM | POA: Diagnosis not present

## 2023-08-10 DIAGNOSIS — S0101XD Laceration without foreign body of scalp, subsequent encounter: Secondary | ICD-10-CM | POA: Diagnosis not present

## 2023-08-10 DIAGNOSIS — S41102D Unspecified open wound of left upper arm, subsequent encounter: Secondary | ICD-10-CM | POA: Diagnosis not present

## 2023-08-10 DIAGNOSIS — J9611 Chronic respiratory failure with hypoxia: Secondary | ICD-10-CM | POA: Diagnosis not present

## 2023-08-10 DIAGNOSIS — S8002XD Contusion of left knee, subsequent encounter: Secondary | ICD-10-CM | POA: Diagnosis not present

## 2023-08-11 ENCOUNTER — Ambulatory Visit (HOSPITAL_COMMUNITY)
Admission: RE | Admit: 2023-08-11 | Discharge: 2023-08-11 | Disposition: A | Discharge status: Home / Self Care | Payer: Medicare HMO | Source: Ambulatory Visit | Attending: Physician Assistant | Admitting: Physician Assistant

## 2023-08-11 ENCOUNTER — Telehealth: Payer: Self-pay | Admitting: *Deleted

## 2023-08-11 ENCOUNTER — Encounter: Payer: Self-pay | Admitting: Physician Assistant

## 2023-08-11 DIAGNOSIS — I6523 Occlusion and stenosis of bilateral carotid arteries: Secondary | ICD-10-CM | POA: Diagnosis not present

## 2023-08-11 DIAGNOSIS — I779 Disorder of arteries and arterioles, unspecified: Secondary | ICD-10-CM | POA: Insufficient documentation

## 2023-08-11 NOTE — Telephone Encounter (Signed)
-----   Message from Tereso Newcomer sent at 08/11/2023 12:37 PM EDT ----- Results sent to [facility] via MyChart. See MyChart comments below. I will fwd to PCP as FYI PLAN:  -Repeat Carotid US in 1 year  [name]  Your carotid ultrasound shows stable plaque in both carotid (neck) arteries. There is some narrowing in the artery to your left arm (left subclavian stenosis). There is no significant change compared to 1 year ago. Continue current medications/treatment plan and follow up as scheduled.  Tereso Newcomer, PA-C

## 2023-08-12 ENCOUNTER — Other Ambulatory Visit: Payer: Self-pay | Admitting: Internal Medicine

## 2023-08-12 DIAGNOSIS — J439 Emphysema, unspecified: Secondary | ICD-10-CM | POA: Diagnosis not present

## 2023-08-12 DIAGNOSIS — S8002XD Contusion of left knee, subsequent encounter: Secondary | ICD-10-CM | POA: Diagnosis not present

## 2023-08-12 DIAGNOSIS — S0101XD Laceration without foreign body of scalp, subsequent encounter: Secondary | ICD-10-CM | POA: Diagnosis not present

## 2023-08-12 DIAGNOSIS — J9611 Chronic respiratory failure with hypoxia: Secondary | ICD-10-CM | POA: Diagnosis not present

## 2023-08-12 DIAGNOSIS — E1122 Type 2 diabetes mellitus with diabetic chronic kidney disease: Secondary | ICD-10-CM | POA: Diagnosis not present

## 2023-08-12 DIAGNOSIS — I5032 Chronic diastolic (congestive) heart failure: Secondary | ICD-10-CM | POA: Diagnosis not present

## 2023-08-12 DIAGNOSIS — I130 Hypertensive heart and chronic kidney disease with heart failure and stage 1 through stage 4 chronic kidney disease, or unspecified chronic kidney disease: Secondary | ICD-10-CM | POA: Diagnosis not present

## 2023-08-12 DIAGNOSIS — I5022 Chronic systolic (congestive) heart failure: Secondary | ICD-10-CM | POA: Diagnosis not present

## 2023-08-12 DIAGNOSIS — I471 Supraventricular tachycardia, unspecified: Secondary | ICD-10-CM

## 2023-08-12 DIAGNOSIS — S41102D Unspecified open wound of left upper arm, subsequent encounter: Secondary | ICD-10-CM | POA: Diagnosis not present

## 2023-08-14 DIAGNOSIS — S8002XD Contusion of left knee, subsequent encounter: Secondary | ICD-10-CM | POA: Diagnosis not present

## 2023-08-14 DIAGNOSIS — I5022 Chronic systolic (congestive) heart failure: Secondary | ICD-10-CM | POA: Diagnosis not present

## 2023-08-14 DIAGNOSIS — E1122 Type 2 diabetes mellitus with diabetic chronic kidney disease: Secondary | ICD-10-CM | POA: Diagnosis not present

## 2023-08-14 DIAGNOSIS — J9611 Chronic respiratory failure with hypoxia: Secondary | ICD-10-CM | POA: Diagnosis not present

## 2023-08-14 DIAGNOSIS — J439 Emphysema, unspecified: Secondary | ICD-10-CM | POA: Diagnosis not present

## 2023-08-14 DIAGNOSIS — I130 Hypertensive heart and chronic kidney disease with heart failure and stage 1 through stage 4 chronic kidney disease, or unspecified chronic kidney disease: Secondary | ICD-10-CM | POA: Diagnosis not present

## 2023-08-14 DIAGNOSIS — I5032 Chronic diastolic (congestive) heart failure: Secondary | ICD-10-CM | POA: Diagnosis not present

## 2023-08-14 DIAGNOSIS — S0101XD Laceration without foreign body of scalp, subsequent encounter: Secondary | ICD-10-CM | POA: Diagnosis not present

## 2023-08-14 DIAGNOSIS — S41102D Unspecified open wound of left upper arm, subsequent encounter: Secondary | ICD-10-CM | POA: Diagnosis not present

## 2023-08-15 ENCOUNTER — Other Ambulatory Visit: Payer: Self-pay | Admitting: Internal Medicine

## 2023-08-17 DIAGNOSIS — I5032 Chronic diastolic (congestive) heart failure: Secondary | ICD-10-CM | POA: Diagnosis not present

## 2023-08-17 DIAGNOSIS — J439 Emphysema, unspecified: Secondary | ICD-10-CM | POA: Diagnosis not present

## 2023-08-17 DIAGNOSIS — S0101XD Laceration without foreign body of scalp, subsequent encounter: Secondary | ICD-10-CM | POA: Diagnosis not present

## 2023-08-17 DIAGNOSIS — I5022 Chronic systolic (congestive) heart failure: Secondary | ICD-10-CM | POA: Diagnosis not present

## 2023-08-17 DIAGNOSIS — I130 Hypertensive heart and chronic kidney disease with heart failure and stage 1 through stage 4 chronic kidney disease, or unspecified chronic kidney disease: Secondary | ICD-10-CM | POA: Diagnosis not present

## 2023-08-17 DIAGNOSIS — S8002XD Contusion of left knee, subsequent encounter: Secondary | ICD-10-CM | POA: Diagnosis not present

## 2023-08-17 DIAGNOSIS — J9611 Chronic respiratory failure with hypoxia: Secondary | ICD-10-CM | POA: Diagnosis not present

## 2023-08-17 DIAGNOSIS — S41102D Unspecified open wound of left upper arm, subsequent encounter: Secondary | ICD-10-CM | POA: Diagnosis not present

## 2023-08-17 DIAGNOSIS — E1122 Type 2 diabetes mellitus with diabetic chronic kidney disease: Secondary | ICD-10-CM | POA: Diagnosis not present

## 2023-08-18 ENCOUNTER — Other Ambulatory Visit: Payer: Self-pay | Admitting: Internal Medicine

## 2023-08-18 DIAGNOSIS — I5032 Chronic diastolic (congestive) heart failure: Secondary | ICD-10-CM | POA: Diagnosis not present

## 2023-08-18 DIAGNOSIS — S8002XD Contusion of left knee, subsequent encounter: Secondary | ICD-10-CM | POA: Diagnosis not present

## 2023-08-18 DIAGNOSIS — I130 Hypertensive heart and chronic kidney disease with heart failure and stage 1 through stage 4 chronic kidney disease, or unspecified chronic kidney disease: Secondary | ICD-10-CM | POA: Diagnosis not present

## 2023-08-18 DIAGNOSIS — J439 Emphysema, unspecified: Secondary | ICD-10-CM | POA: Diagnosis not present

## 2023-08-18 DIAGNOSIS — S0101XD Laceration without foreign body of scalp, subsequent encounter: Secondary | ICD-10-CM | POA: Diagnosis not present

## 2023-08-18 DIAGNOSIS — J9611 Chronic respiratory failure with hypoxia: Secondary | ICD-10-CM | POA: Diagnosis not present

## 2023-08-18 DIAGNOSIS — S41102D Unspecified open wound of left upper arm, subsequent encounter: Secondary | ICD-10-CM | POA: Diagnosis not present

## 2023-08-18 DIAGNOSIS — I5022 Chronic systolic (congestive) heart failure: Secondary | ICD-10-CM | POA: Diagnosis not present

## 2023-08-18 DIAGNOSIS — E1122 Type 2 diabetes mellitus with diabetic chronic kidney disease: Secondary | ICD-10-CM | POA: Diagnosis not present

## 2023-08-18 MED ORDER — XTAMPZA ER 9 MG PO C12A: 9.0000 mg | EXTENDED_RELEASE_CAPSULE | Freq: Three times a day (TID) | ORAL | 0 refills | Status: DC

## 2023-08-19 DIAGNOSIS — S0101XD Laceration without foreign body of scalp, subsequent encounter: Secondary | ICD-10-CM | POA: Diagnosis not present

## 2023-08-19 DIAGNOSIS — J9611 Chronic respiratory failure with hypoxia: Secondary | ICD-10-CM | POA: Diagnosis not present

## 2023-08-19 DIAGNOSIS — I5022 Chronic systolic (congestive) heart failure: Secondary | ICD-10-CM | POA: Diagnosis not present

## 2023-08-19 DIAGNOSIS — I130 Hypertensive heart and chronic kidney disease with heart failure and stage 1 through stage 4 chronic kidney disease, or unspecified chronic kidney disease: Secondary | ICD-10-CM | POA: Diagnosis not present

## 2023-08-19 DIAGNOSIS — J439 Emphysema, unspecified: Secondary | ICD-10-CM | POA: Diagnosis not present

## 2023-08-19 DIAGNOSIS — I5032 Chronic diastolic (congestive) heart failure: Secondary | ICD-10-CM | POA: Diagnosis not present

## 2023-08-19 DIAGNOSIS — S41102D Unspecified open wound of left upper arm, subsequent encounter: Secondary | ICD-10-CM | POA: Diagnosis not present

## 2023-08-19 DIAGNOSIS — S8002XD Contusion of left knee, subsequent encounter: Secondary | ICD-10-CM | POA: Diagnosis not present

## 2023-08-19 DIAGNOSIS — E1122 Type 2 diabetes mellitus with diabetic chronic kidney disease: Secondary | ICD-10-CM | POA: Diagnosis not present

## 2023-08-20 ENCOUNTER — Encounter: Payer: Self-pay | Admitting: Internal Medicine

## 2023-08-20 ENCOUNTER — Ambulatory Visit: Payer: Medicare HMO | Admitting: Internal Medicine

## 2023-08-20 VITALS — BP 128/72 | HR 82 | Temp 98.2°F | Resp 16 | Ht 65.0 in | Wt 157.8 lb

## 2023-08-20 DIAGNOSIS — I872 Venous insufficiency (chronic) (peripheral): Secondary | ICD-10-CM

## 2023-08-20 DIAGNOSIS — I739 Peripheral vascular disease, unspecified: Secondary | ICD-10-CM

## 2023-08-20 DIAGNOSIS — M810 Age-related osteoporosis without current pathological fracture: Secondary | ICD-10-CM

## 2023-08-20 DIAGNOSIS — N1832 Chronic kidney disease, stage 3b: Secondary | ICD-10-CM

## 2023-08-20 DIAGNOSIS — J449 Chronic obstructive pulmonary disease, unspecified: Secondary | ICD-10-CM

## 2023-08-20 DIAGNOSIS — D649 Anemia, unspecified: Secondary | ICD-10-CM

## 2023-08-20 DIAGNOSIS — R1310 Dysphagia, unspecified: Secondary | ICD-10-CM

## 2023-08-20 DIAGNOSIS — Z0000 Encounter for general adult medical examination without abnormal findings: Secondary | ICD-10-CM | POA: Diagnosis not present

## 2023-08-20 DIAGNOSIS — E039 Hypothyroidism, unspecified: Secondary | ICD-10-CM | POA: Diagnosis not present

## 2023-08-20 DIAGNOSIS — F411 Generalized anxiety disorder: Secondary | ICD-10-CM

## 2023-08-20 DIAGNOSIS — R413 Other amnesia: Secondary | ICD-10-CM

## 2023-08-20 DIAGNOSIS — I10 Essential (primary) hypertension: Secondary | ICD-10-CM

## 2023-08-20 DIAGNOSIS — M159 Polyosteoarthritis, unspecified: Secondary | ICD-10-CM

## 2023-08-20 DIAGNOSIS — I480 Paroxysmal atrial fibrillation: Secondary | ICD-10-CM

## 2023-08-20 DIAGNOSIS — I5032 Chronic diastolic (congestive) heart failure: Secondary | ICD-10-CM | POA: Diagnosis not present

## 2023-08-20 DIAGNOSIS — I6523 Occlusion and stenosis of bilateral carotid arteries: Secondary | ICD-10-CM

## 2023-08-20 DIAGNOSIS — K7460 Unspecified cirrhosis of liver: Secondary | ICD-10-CM

## 2023-08-20 DIAGNOSIS — I771 Stricture of artery: Secondary | ICD-10-CM

## 2023-08-20 DIAGNOSIS — J9611 Chronic respiratory failure with hypoxia: Secondary | ICD-10-CM

## 2023-08-20 DIAGNOSIS — G2581 Restless legs syndrome: Secondary | ICD-10-CM

## 2023-08-20 DIAGNOSIS — K219 Gastro-esophageal reflux disease without esophagitis: Secondary | ICD-10-CM

## 2023-08-20 DIAGNOSIS — M1A09X0 Idiopathic chronic gout, multiple sites, without tophus (tophi): Secondary | ICD-10-CM

## 2023-08-20 DIAGNOSIS — E785 Hyperlipidemia, unspecified: Secondary | ICD-10-CM

## 2023-08-20 DIAGNOSIS — K551 Chronic vascular disorders of intestine: Secondary | ICD-10-CM | POA: Diagnosis not present

## 2023-08-20 DIAGNOSIS — T148XXA Other injury of unspecified body region, initial encounter: Secondary | ICD-10-CM

## 2023-08-20 DIAGNOSIS — G894 Chronic pain syndrome: Secondary | ICD-10-CM

## 2023-08-20 DIAGNOSIS — I2510 Atherosclerotic heart disease of native coronary artery without angina pectoris: Secondary | ICD-10-CM

## 2023-08-20 DIAGNOSIS — Z86718 Personal history of other venous thrombosis and embolism: Secondary | ICD-10-CM | POA: Insufficient documentation

## 2023-08-20 DIAGNOSIS — Z6826 Body mass index (BMI) 26.0-26.9, adult: Secondary | ICD-10-CM | POA: Diagnosis not present

## 2023-08-20 DIAGNOSIS — Z86711 Personal history of pulmonary embolism: Secondary | ICD-10-CM

## 2023-08-20 DIAGNOSIS — F330 Major depressive disorder, recurrent, mild: Secondary | ICD-10-CM

## 2023-08-20 DIAGNOSIS — I471 Supraventricular tachycardia, unspecified: Secondary | ICD-10-CM

## 2023-08-20 DIAGNOSIS — K5520 Angiodysplasia of colon without hemorrhage: Secondary | ICD-10-CM

## 2023-08-20 DIAGNOSIS — G4733 Obstructive sleep apnea (adult) (pediatric): Secondary | ICD-10-CM

## 2023-08-20 NOTE — Assessment & Plan Note (Signed)
She has CAD with history of CABG x 3 in 2004.  She last saw cardiology in 10/2022 and she has ben stable.  We will continue with risk factor modification and continue her ASA 81mg  daily.

## 2023-08-20 NOTE — Assessment & Plan Note (Signed)
She has had GIB in the past where she has small bowel AVM's.  We have been checking her blood counts and we will do this again today.  She is not a candidate for anticoagulation.

## 2023-08-20 NOTE — Assessment & Plan Note (Signed)
She has a history of PAD and carotid stenosis bilaterally.  Cardiology is following her and doing carotid US once a year.  There has been no change this past year.  We will continue on risk factor modificaiton.

## 2023-08-20 NOTE — Assessment & Plan Note (Signed)
She chronic respiratory failure due to her diastolic dysfunction and COPD.  She is stable on her current oxygen via [place].

## 2023-08-20 NOTE — Assessment & Plan Note (Signed)
She has chronic diastolic CHF where she has some edema today.  Again, we will increase her lasix to 80mg  every day for the next 5 days and then go back to every other day.

## 2023-08-20 NOTE — Assessment & Plan Note (Signed)
She had a zio patch done in 09/2022 that showed the patient was in atrial flutter 100% of time.  Cardiology had seen her and again did not recommend anticoagulation due to a history of GI bleeding and AVM's.  She is currently on metoprolol at this time.

## 2023-08-20 NOTE — Assessment & Plan Note (Signed)
Her A. Fib is currently rate controlled.  Again, she had a zio patch done in 09/2022 that showed the patient was in atrial flutter 100% of time.  Cardiology had seen her and again did not recommend anticoagulation due to a history of GI bleeding and AVM's.  She is currently on metoprolol at this time.

## 2023-08-20 NOTE — Assessment & Plan Note (Signed)
She has hyperlipidemia with a history of CAD.  She is on lipitor at this time.  We will recheck her LFT's today as she has a history of cirrhosis.

## 2023-08-20 NOTE — Assessment & Plan Note (Signed)
She has stage IIIb CKD probably related to her hypertension.  We will continue to avoid nephrotoxins.  We will obtain urine studies today.  She will continue on farxiga at this time.

## 2023-08-20 NOTE — Assessment & Plan Note (Signed)
She has DTI's to her bilateral lower buttocks that are less than 1cm in diameter.  I want her to continue to be active and getting up.  She can go back to using calmoseptine at this time.

## 2023-08-20 NOTE — Assessment & Plan Note (Signed)
Her depression and anxiety seem to be controlled today on my exam.  She had stopped her abilify this past year and her depression worsened.   We will need to continue on her current medications.

## 2023-08-20 NOTE — Assessment & Plan Note (Signed)
She seems euthyroid.  We will recheck her TFT's today and continue on her supplementation.

## 2023-08-20 NOTE — Assessment & Plan Note (Addendum)
She had abdominal pain in 2019 and during workup they noted she had a SMA stenosis where she had a stent placerd at that time.  She has had chronic abdominal pain due to recurrent mesenteric symptoms where she was in the ER multiple times in 2019 with abdominal pain.  She refused repeat imaging at those times.  Today, this does not seem to be a problem.  We will continue with PAD management.

## 2023-08-20 NOTE — Assessment & Plan Note (Signed)
She will continue on allopurinol at this time.  She has not had any recent gout attacks.

## 2023-08-20 NOTE — Progress Notes (Signed)
Preventive Screening-Counseling & Management     [name] is a 82 y.o. female who presents for Medicare Annual/Subsequent preventive examination.  [name] is a 82 year old Caucasian/White female who presents for her annual wellness exam. She is due for the following health maintenance studies: visual exam and screening labs. This patient's past medical history Anemia, Anxiety Disorder, Back Pain, CAD, Chronic kidney disease, Stage III (moderate), Chronic pain syndrome, Cirrohsis, Congestive Heart Failure, COPD, Deep Vein Thrombosis, Degenerative Disc Disease, Depression, GERD, Gout, History of Falls, Home Oxygen, Hyperlipidemia, Hypertensive Heart Disease, Benign, With Heart Failure, Hypothyroidism, Acquired, Obesity, Obstructive Sleep Apnea, Osteoarthritis, Osteoporosis, Respiratory Failure, Chronic, Restless Leg Syndrome, and Vitamin D deficiency.   Her last eye exam was about 2 years ago per the patient and she states her vision is doing well. Her last colonoscopy was done in 01/2017 which showed diverticulosis, external hemorrhoids and a rectal polyp. An EGD was done in 01/2017 which showed mild erosive gastritis and multiple small bowel AVM's. She has reflux and was increased to protonix BID (See below). Her last mammogram was in 01/2016 which demonstrated a capsular herniation of her left implant with leaking silicone. She knows that both implants are now leaking. She did see [facility] plastic surgery and they recommended removal but the patient could not afford this. She tells me she had a second opinion by another surgeon and she has decided not to have it removed. She states there are no assistance programs to have implants removed. She has decided since then to not have any mammograms. The patient does not exercise regularly. She does get yearly flu shots. She has had a pneumovax 23 vaccine since age of 64 as well as her shingles vaccine. She states she had the prevnar 13 in  12/2015. She has not had any of the COVID-19 vaccines. She states she is having some mild memory loss (see below). She is on an ASA 81mg  daily.   [name] was seen earlier this month where she had a fall and sustained a skin tear to her left arm.  Over the interim, this is now healed.  She was seen in the [facility] ER on 07/14/2023 due to a fall.  She was standing in her kitchen when she turned around and fell.  She initially hit her knee first and then she hit her left forehead on the floor.  There was no LOC.  She states she doesn't remember if she got weak or if she tripped but she is not sure of the mechanism of the fall.  She hit her left knee, left elbow and her forehead and sustained a skin tear of her left elbow.  They did a CT scan of her head and c-spine which showed no acute intracranial abnormality. There was a left frontal scalp laceration where they glued this.  There was no acute cervical spine fracture or traumatic listhesis.    I saw her in 04/2023 where she had a DTI of her bilateral buttocks right at the top crease of her buttocks that had started a month before.  She was sitting on her bottom and at that time I started her calmoseptine and homehealth PT is already involved with her where I will get wound care to come assess her as well.  This area did heal but about a month ago these bilateral buttock DTI wounds have reappeared.   The patient was also complaining a few weeks ago about dysphagia.  She began  having problems with swallowing which started last month.  She was having problems with swallowing with both solids and liquids.  I asked her to start taking protonix 40mg  BID as she was having reflux symptoms.  She had an EGD done in 01/2017 which showed mild erosive gastritis and multiple small bowel AVM's.  I have referred her to GI for evaluation and she has an appointment with them on 08/31/2023.   [name] is a 82 yo female was seen at the [facility] ER on 06/10/2023 for worsening erythema to her  lower extremities.  This was painful to the touch with leg swelling of her left leg.  The ER obtained a venous doppler US on 06/10/2023 and this showed a partially occlusive thrombus in the left popliteal vein.  The ER started her on eliquis 10mg  starting dose and asked her to continue on eliquis and followup with me.  The patient began having nose bleeds yesterday where I asked her not to on anticoagulants due to GIB and history of AVM's.  I stopped her off her eliquis.  She has an IVC filter in place which should protect from PE.  Today, she comes back with continued erythema of her bilateral lower legs up to her mid calf.  She has been having some edema in both legs and she has now noticed some mid calf blistering of her right leg that started 2 days ago.   The patient has a history of chronic anemia due to AVM's of her small bowel where she has had a GIB in the past.  She was admitted in 02/2018 where she was noted to have chronic mesenteric ischemia and they placed a superior mesenteric artery stent.   She has a history of GIB in 2018 where she was on xarelto for a history of a DVT but was found to have acute blood loss anemia due to an upper GI bleed.  They did an EGD 01/2017 which showed nonbleeding erosive gastropathy and a capsule endoscopy showed gastric erosions.  She also had AVM's in her small bowel and a colonoscopy also performed in 01/2017 showed severe pan diverticulosis.  She is now permanently off her xarelto due to her her GIB.  They did not anticoagulate her but she had an IVC filter placed on 03/14/2018.  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  The patient was placed on ASA and plavix at that time but developed a large abdominal wall hematoma.  She was discharged and sent to a SNF for rehab but was readmitted a week later to [facility] due to hematuria where she was admitted on 04/01/2018.  Urology saw her and discontinued her plavix and did  cystoscopy as part of her workup where they did do a transurethral resection of a bladder tumor.  The patient is not on iron at this time and she is only on ASA 81mg  daily.    [name] is a 82 yo female who comes in today for a hospital followup where she was admitted to [facility] from 05/09/2023 until 05/15/2023 for an acute COPD exacerbation.  Her last hospitalization for COPD was in 04/2021 where she was doing well but on the 5/18 which was a Saturday, she began having acute SOB and cough where she was brought in to the ER.  Her initial CXR showed no acute findings.  She was admitted and started on IV steriods, antibiotics, as well as duonebs.  They transitioned  her to oral prednisone and doxycycline which she completed before discharge.  She had acute on chronic hypoxic respiratory failure but thankfully she did not require more oxygen than her normal baseline of 3L oxygen via [place].  Again, the patient has a history of moderately severe COPD of years duration and presents today for a status visit.  The patient has chronic respiratory failure where she was using oxygen at 3L via [place] at night time but she has been doing well with her trelegy.  They are currently waiting on preauthorization for trelegy to continue on this.  Today, she states she does not use her oxygen much at night anymore and uses during the day as needed.  She did see her pulmonologist in 07/2021 where they started her on trelegy.  She is on Combivent as needed.  She no longer has to use a duoneb.  She would get DOE with minimal exertion but this has improved greatly on trelegy.   She also has a history of OSA and is supposed to be on CPAP at night but she states she does not use this.  She is no longer on daliresp anymore.  See PMH for summary of pulmonary history and lab reports for status of any previous PFTs: Congestive Heart Failure, COPD, and Obstructive Sleep Apnea. Comorbid conditions : CHF. She has the following baseline symptoms:  exertional dyspnea. The exertional dyspnea is brought on by the walking 40-50 feet but there has been no change this past year. The patient's condition is controlled by medications as summarized in the medication list on the face sheet. She has the following modifiable risk factors: CHF, sedentary lifestyle, and obesity. Specifically denied complaints: worsening exertional dyspnea, increased wheezing, productive cough, change in color of sputum, pleuritic chest pain, hemoptysis, fever, and syncope.     [name] is a 82 yo female who returns today for followup of her chronic pain syndrome.  Since her last visit, there has been no change to her pain levels over the last 3 months.  She was having worsening pain of her chronic right knee pain.  She did see Dr. Ophelia Charter in ortho in 01/2023 where he noted she has had persistent knee patellar pain since her right knee arthroplasty done in [place] 2007 by Dr.Feder. She had 1 single injection of cortisone in 09/2022 that gave her some relief and Dr. Magnus Ivan discussed with her that repetitive cortisone injections are not a good idea with risks of infection.   X-rays have shown persistent lateral osteophyte or lateral patellofemoral ligament calcification with small crack which looks like a small avulsion.  She was told about this up to 4 years ago and it has been persistent.  She has had knee aspiration in the past which showed no evidence of infection.  Bone scan showed slight increased uptake around the patella.  Serial x-rays showed no evidence of patellar component loosening.  Her pain seems to be worse when she is on her knee more.  Dr. Ophelia Charter discussed with her that he thinks that a patellar revision will unlikely give her pain relief.  This component appears to be well-fixed and he wants her to use a short knee immobilizer that she can wear intermittently to take some pressure off the knee.  She has previously tried a copper sleeve without resolution.  She has  chronic pain due to low back pain as well as pain from her history of bilateral knee replacements and bilateral shoulders.   She is currently on  Xtampza ER 9mg  po TID but she uses this twice a day.  This seems to help control her pain.  We noted she had acute pain in 2022 where she was having worsening pain with weakness and fatigue.  I thought these symptoms were a combination from recovering from COVID-19 and depression.  She did improve at that time.  Due to her acute pain as described , I spoke to Dr. Welton Flakes in Pain Management around 08/2021 where we decided to hold her Xtampza for a few weeks and put her on short acting oxycodone 5mg  po q 6 hrs prn.  I did restart her on celexa and her depression improved at that time.  [name] surgery on 01/2020.  She was on gabapentin 200mg  BID for pain as well but is no longer on this. Her chronic pain effects her neck, shoulders, lower back, hips and her knees.  She has seen Dr. Welton Flakes in pain management who recommended placing her on oxycodone ER 10mg  po BID.  The patient could not afford this so we switched her to oxycodone 10mg  po q 6 hr prn which she does take 4 times per day.  She states she could not function with her day to day activites without her pain medications.  She has been on MS contin and oxycodone ER in the past.  She tells me that this medication gives her fatigue and all she wants to do is sit down and do nothing and is not able to do alot of her ADL's.  She was previously on percocet 10/325mg  po q 6hr prn (which she took 4 times per day) before the MS contin and was on this for years.  She was on long term benzos in the past but again we stopped her off of these.  The patient has seen seen Dr. Edwyna Perfect (orthopedics) in [place] and Dr. Wynetta Emery (neurosurgery).  She remains on cyclobenzaprine as a muscle relaxant.  The patient had had left shoulder surgery in 2014 due to impingement syndrome and a chronic rotator cuff  tear and labral tear where they performed a debridement and synvectomy.  This did not correct her shoulder but did reduce the amount of pain she has had in that arm/shoulder.  Her back pain has remained the same.  She also has chronic back pain as well as pain from her history of bilateral knee replacements and bilateral shoulders.   She continues to have pain in both of her knees when she gets up.  She is still using a cane and no longer uses a walker for longer distances.  The patient denies any falls.  She does use a cane to get around and has [employer].  In regards to her chronic lower back pain, her pain mostly affects her when she stands up.  The pain is an intermittent dull aching she rates as severe at times but again improves with sitting and medications.  There is no pain radiation.  She had back surgery 2012 where her neurosurgeon noted she had worsening back pain with progressive break down of L1-L2 above the levels of her L2-L5 fusion in the past.  They did do surgery and removed some of her hardware.  She has tried PT and pool therapy which she states did not help.  Her pain medicine allows her to do her ADL's including cleaning her house and shopping.  The patient was tried on cymbalta in the past but this made her having  nausea and vomiting.  There is no loss of bowel/bladder function or new weakness or numbness.  Her last dose of Xtampza was this morning.     I did see [name] in 01/2023 for followup of  PSVT where I saw her 2 weeks prior and noted she had an irregular rhythm where we did an EKG that showed supraventricular tachycardia with a HR of 113.  I reviewed cardiology's notes from 09/2022 and they did a zio patch at that time that showed 100% atrial flutter.  They did not add any medications at that time as she was asymptomatic and her HR was controlled.  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   She was noted to have paroxysmal A. Fib when she had  pneumonia in the past. I did start her on low dose metoprolol 12.5mg  BID for this PSVT which did control her rate.  She did have followup with cardiology in 02/2023 and they did not change any of her meds and she was noted to be stable at that time.     We are also following [name] for some memory loss where we saw her in 12/2022.  Her daughter stated they had noted some memory changes that occurred since 11/2022.  She had some difficulty yesterday remembering her daughter and difficulty remembering to buckle her seat belt.  She is forgetting words as she is starting to talk.  Again, the daugther states that there is no worsening depression where we noted this was controlled in 12/2022.  Her MMSE 04/2022 was 28/30 and in 12/2022 she scored a 24/30.  We did lab testing at that time and her labs were normal except for her CKD and her TSH was a bit elevated.  Today on 07/2023, she scored a 24/30 on her MMSE.  Her family members states that there has been no change to her memory since her evaluation in 12/2022.  IF she gets panicked, she can get confused.  We did start her on aricept 10mg  nightly where she denies any diarrhea.    [name] returns today for followup of her depression and anxiety.  This past year, she had stopped her abilify and her depression and anxiety had worsened.  I did restart her back on abilify at 2mg  daily and over the interim, she states her depression and anxiety improved.  She is accompanied by her granddaughter today who states her depression and anxiety are doing well.  She states that both her depression and anxiety are mild.  The patient was feeling down with sadness and crying at times but after restarting her abilify, this greatly improved.  I saw her in followup in 02/2021 for a yearly exam where I felt her depression was worsened.  She was having memory problems at the time which I felt was due to worsening depression.  We increased her abilify from 1mg  to 2mg  daily in early 2022.   She told me at that time that her citalopram was stopped when she was in the hospital the year before.  She had worsening depression in 2020 where I restarted her back on citalopram.  This improved some but ultimately we increased her dose to citalopram 40mg  daily and started her on abilify 1mg  daily.  I again restarted her citalopram in 08/2021 and today she states her depression is controlled.  She was also on clonazepam as needed for her anxiety but again this has been stopped in the setting  of her chronic pain med use.  Today, she states that her depression and anxiety are both moderate.   I felt that her complaints of mild memory loss in the past was due to her worsening depression.  Her memory problems worsened as well as her depression after stopping abilify in the past.  She remains on trazodone 150mg  po at night for sleep and is on citalopram 10mg  daily and abilify 2mg  daily.  She is also on melatonin for sleep.  She denies any side effects from her depression medications.  Again, her husband passed away in 03-09-15 and since then she has noticed problems with her short term memory.  She is forgetting where she put things and can lose track of what she is saying with conversations.  She has a history of depression and anxiety.  She is on a number of medications which could cause her to have memory problems and we discussed stopping some of her meds but she opted not to do this.  She has feelings of isolation but no suicidal ideation, homicidal ideation, weight loss, loss of appetite, and social withdrawal. This patient feels that she is able to care for herself.  She has no significant prior history of mental health disorders.      She does have Stage IIIb CKD where her her baseline creatinine is 1.1-1.2 with GFR 46-50.  She is avoiding nephrotoxins as able.  She came in this past Fall with abdominal pain and we sent her for a CT with contrast of her abd/pelvis due ther history of chronic mesenteric  ischemia.  Her kidney function was elevated but after rehydration, her followup creatinine came back to baseline.  Since her last visit, her abdominal pain did resolve.  The patient is currently on farxiga.  Again, [name] was hospitalized with abdominal pain with vomiting in 2018-03-08 where she presented to the ER.  She was admitted at that time and they peformed an abdominal aortogram which demonstrated chronic mesenteric ischemia and they placed a superior mesenteric artery stent.   She has a history of GIB in 2018 where she was on xarelto for a history of a DVT but was found to have acute blood loss anemia due to an upper GI bleed.  They did an EGD 01/2017 which showed nonbleeding erosive gastropathy and a capsule endoscopy showed gastric erosions.  She also had AVM's in her small bowel and a colonoscopy also performed in 01/2017 showed severe pan diverticulosis.  Her previous colonoscopy in [name], 2015 demonstrated diverticulosis.  She had anemia in [name], 2015 and they also performed an EGD which showed gastritis. During her hospitalization, they held her anticoagulation and placed her on a PPI.  She is now permanently off her xarelto due to her her GIB.  They did not anticoagulate her but she had an IVC filter placed on 03/14/2018.  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  The patient was placed on ASA and plavix at that time but developed a large abdominal wall hematoma.  She was discharged and sent to a SNF for rehab but was readmitted a week later to [facility] due to hematuria where she was admitted on 04/01/2018.  Urology saw her and discontinued her plavix and did cystoscopy as part of her workup where they did do a transurethral resection of a bladder tumor.  They repeated her CT scan of her abdomen and saw that the abdominal wall hematoma was unchanged.  She did  see urology in 06/2018 for followup where she was having some hematuria.  They thought the pathology  was benign and her hematuria did resolve.  She was also noted to have anasarca due to hypoalbuminemia where she had her lasix increased. She did see Dr. Darrick Penna in vascular surgery in 06/2018 for followup.  He feels she has chronic abdominal pain due to recurrent mesenteric symptoms where she had been seen in the ER multiple times in 2019 with abdominal pain.  He wanted to repeat a mesenteric arteriogram but the patient refused.  She states she would do this if she returned to the ER.  He asked her to use compression stockings for her swelling in her legs when needed.  She did see GI in 05/2018 and they mentioned in their note that she has unspecified cirrhosis with etiology probably being NASH. She is currently on iron twice a day.  There is no BRBPR or melena.  [name] also has a history of chronic diastolic congestive heart failure.  She did see cardiology on 07/2021 where they wanted to start her on farxiga.  Her last visit with cardiology was in 09/2022 and they felt she was stable.    She was previously on Lasix 80mg  daily but when she saw cardiology they recommended to take this as needed.  She admits today that she is taking Lasix 80mg  every other day.  She is also on spironolactone 25mg  which she does take daily.  Again, she has had multiple admissions to the hospital in the past for acute on chronic respiratory failure due to her COPD and diastolic CHF. She is not currently on fluid restriction.  She continues to do daily weights where we believe her dry weight is around 167-168 lb.  The patient does have a history of Stage III CKD and a history of cirrhosis and CAD.  Her last ECHO was done on 06/06/2022 and this showed a LVEF 60-65% with normal function and without regional wall motion abnormalities.  There was mild LVH and her LV diastolic parameters were indeterminate.  Her RV systolic function was normal.  Her bilateral atrium were moderately dilated.  There was mild MV regurgitaiton.  There was  severe mitral annular calcification.  She had mild AV regurgitation.  She had a previous ECHO in 10/09/2020 and this showed a normal LVEF of 60-65% with diastolic dysfunction.  She had no regional wall motion abnormalites however she had moderate concentric LVH.  She had moderate bilateral atrial enlargement.  Today, there is no chest pain, no worsening SOB, palpitations but she does have some peripheral edema.    She did have a carotid US done on 08/11/2023 that showed a 1-39% stenosis in the RICA.  Non-hemodynamically significant plaque <50% noted in the CCA.  There was a 40-59% stenosis of the left ICA.  Non-hemodynamically significant plaque <50% noted in the CCA. The ECA appears >50% stenosed. Her Bilateral vertebral arteries demonstrate antegrade flow.  Her left subclavian artery was stenotic. Normal flow hemodynamics were seen in the right subclavian artery.  Otherwise there was no significant change compared to 1 year ago.  Cardiology wanted her to continue her current medications/treatment plan and follow up as scheduled.   I did see Mrs. in 08/2021 with multiple complaints including generalized weakness, fatigue and worsening pain.  Unfortunately she contracted COVID-19 where she tested positive for COVID on 09/02/2021 and contacted my office.  I referred her to [facility] for immunoglobulin therapy which she received.  At  the time, she began having upset stomach, increased pain and cough.  She also states her depression also has worsened since she got sick with COVID.  She also stated that the insomnia had worsened.  We did switch her from Xtampza to short acting oxycodone for a few weeks due to acute pain.  I also restarted her on citalopram at that time.  She states that her fatigue and weakness improved.    The patient has a known history of moderately severe CAD where she had a CABG x 3 in 2004 and stenting of her left main in 2010.  Again, she last saw cardiology on 10/2022.  They felt she was  stable and was not having any angina.  She had a myoview done on 08/2017 which showed distal anterior and apical ischemia but it was low risk.  They noted her CAD with chronic and stable.  Her CAD is controlled with therapy as summarized in the medication list and previous notes.   She had a repeat heart catherization in 10/2011 and this showed an LV gram of 50-55%.  There was significant in stent restenosis but they did not intervene because her left main stent had a patent graft to the LAD and OM3 and they decided to do medical management.  She had recurrent chest pain in July 2013 where we did a lexiscan at that time demonstrated inferior basal ischemia with an normal LV function with EF 60%. She declined heart catherization at that time and cardiology continued with medical management.  She was on Imdur and lisinopril at one time.  The patient has no comorbid conditions. She has the following baseline symptoms: exertional chest pain. The exertional chest pain is brought on by the equivalent of climbing 10 stairs. She has the following modifiable risk factor(s): HTN, hyperlipidemia, sedentary lifestyle, and obesity. Specifically denied complaint(s): chest pain, palpitations, or syncope.   The patient is a 82 year old Caucasian/White female who presents for a follow-up evaluation of hypertension. The patient has been checking her blood pressure at home.  She states her systolic BP is running in the 962-952'W at home.  The patient's current medications include: Lasix 80mg  every other day, spironolactone 25mg  daily.  She was on lisinopril 2.5mg  daily but in 02/2022 her cardiologist stopped this due to low BP.  She gave her amlodipine 2.5mg  po daily as needed for BP >160. The patient has been tolerating her medications well. The patient denies any headache, visual changes, chest pain, shortness of breath, orthopnea, and weakness/numbness. She reports there have been no other symptoms noted.  [name] returns  today for routine followup on her cholesterol.  Overall, she states she is doing well and is without any complaints or problems at this time. She specifically denies abdominal pain, nausea, vomiting, diarrhea, myalgias, and fatigue. She remains on dietary management alone.  She was on pravastatin 80mg  po daily but this fell off her MAR sometime in 02/2014.  She is not sure why she stopped this medication.  She remains on lipitor 20mg  daily.  She is fasting in anticipation for labs today.   The patient is a 82 year old Caucasian/White female who returns for a regularly scheduled thyroid check.  She did go for a thyroid US on 12/04/2014 and this showed an enlarged thyroid with hetergenous thyroid gland consistent with goit but no nodules were identified.  Since the last thyroid check, there has been no overall change in her status. She was on levothyroxine oral  tablet 50 mcg daily.   She claims to have no symptoms suggestive of thyroid imbalance specifically denying fatigue, cold intolerance, heat intolerance, tremors, anxiety, unexplained weight changes, and dry skin, palpitations, diarrhea, constipation or other problems.    The patient also has a history of osteoporosis.  She denies a family history of osteoporosis. There are no risk factors. She denies the following: smoking, diabetes mellitus, high caffeine intake, alcohol consumption of more that 7 ounces per week, daily prednisone use, hyperthyroidism, surgical resection of her bowel, and surgical resection of her stomach. She states she does not exercise routinely.  We did perform a bone denisty on 12/2013 and this showed that she had osteopenia.  I did repeat a bone density on 01/2016 and this showed a tscore of -2.8 and we placed her on prolia with her first injection on 02/11/2016.  Her last prolia injection was done on 04/2017.  She has never been on a bisphospanate.  We tried putting her on fosamax in the past but she did not take this.  The patient was  seen by Rheumatology on 03/2017 due to swelling and pain of her bilateral ankles and the bottoms of her feet.  I initially thought this was gout and placed her on colchicine and steroids but she did not improve.  I placed her on allopurinol due to an elevated uric acid level.  Rheumatology believes she has either inflammatory arthritis versus gout and she was told she has gout.  The patient remains on allopurinol at this time.  She has not had a gout flare in years.          Are there smokers in your home (other than you)? No  Risk Factors Current exercise habits:  as above   Dietary issues discussed: none   Depression Screen (Note: if answer to either of the following is "Yes", a more complete depression screening is indicated)   Over the past two weeks, have you felt down, depressed or hopeless? No  Over the past two weeks, have you felt little interest or pleasure in doing things? No  Have you lost interest or pleasure in daily life? No  Do you often feel hopeless? No  Do you cry easily over simple problems? No  Activities of Daily Living In your present state of health, do you have any difficulty performing the following activities?:  Driving? No Managing money?  No Feeding yourself? No Getting from bed to chair? No Climbing a flight of stairs? No Preparing food and eating?: No Bathing or showering? No Getting dressed: No Getting to the toilet? No Using the toilet:No Moving around from place to place: No In the past year have you fallen or had a near fall?:Yes   Are you sexually active?  No  Do you have more than one partner?  No  Hearing Difficulties: No Do you often ask people to speak up or repeat themselves? No Do you experience ringing or noises in your ears? Yes Do you have difficulty understanding soft or whispered voices? No   Do you feel that you have a problem with memory? Yes  Do you often misplace items? Yes  Do you feel safe at home?  Yes  Cognitive  Testing  Alert? Yes  Normal Appearance?Yes  Oriented to person? Yes  Place? Yes   Time? Yes  Recall of three objects?  Yes  Can perform simple calculations? No  Displays appropriate judgment?Yes  Can read the correct time from a watch face?Yes  SHE SCORED A 24/30 ON HER MMSE TODAY.   Fall Risk Prevention  Any stairs in or around the home? No  If so, are there any without handrails? No  Home free of loose throw rugs in walkways, pet beds, electrical cords, etc? yes Adequate lighting in your home to reduce risk of falls? Yes  Use of a cane, walker or w/c? Yes - she uses a walker at this time   Time Up and Go  Was the test performed? Yes .  Length of time to ambulate 10 feet: 20 sec.   Gait slow and steady with assistive device    Advanced Directives have been discussed with the patient? Yes   List the Names of Other Physician/Practitioners you currently use: Patient Care Team: Crist Fat, MD as PCP - General (Internal Medicine) Pricilla Riffle, MD as PCP - Cardiology (Cardiology) Donalee Citrin, MD as Consulting Physician (Neurosurgery) Kennon Rounds as Physician Assistant (Cardiology)    Past Medical History:  Diagnosis Date   Anemia    bld. transfusion post lumbar surgery- 2012   Anxiety    Arthralgia    NOS   CAD (coronary artery disease)    s/p CABG 2004; s/p DES to LM in 2010;  LHC 10/29/11: EF 50-55%, mild elevated filling pressures, no pulmonary HTN, LM 90% ISR, LAD and CFX occluded, S-RI occluded (old), S-OM3 ok and L-LAD ok, native nondominant RCA 95% -  med rx recommended ; Lexiscan Myoview 7/13 at [facility]: demonstrated "normal LV function, anterior attenuation and localized ischemia, inferior, basilar, mid section"   Carotid artery disease (HCC)    Carotid US 6/19:  RICA 1-39; LICA 40-59; R subclavian stenosis - Repeat 1 year. // Carotid US 05/2019: R 1-39; L 40-59; R vertebral with atypical antegrade flow; R subclavian stenosis >> repeat 1 year //  Carotid US 7/21: Bilat 40-59; L subclavian stenosis // Carotid US 7/22: Bilat ICA 40-59; R subclavian stenosis // Carotid US 08/10/23: RICA 1-39, LICA 40-59, L subclavian stenosis   CHF (congestive heart failure) (HCC) 01/21/2020   Chronic diastolic heart failure (HCC)    Echo 9/10: EF 60-55%, grade 1 diastolic dysfunction   Chronic lower back pain    Chronic respiratory failure (HCC)    Cirrhosis (HCC)    CKD (chronic kidney disease), stage III (HCC)    COPD (chronic obstructive pulmonary disease) (HCC)    Emphysema dxed by Dr. Sherril Croon in [place] based on PFTs per pt in 2006; placed on albuterol   Degenerative disc disease, lumbar    Depression    TAKES CELEXA  AND  (OFF- WELBUTRIN)   Diuretic-induced hypokalemia 10/08/2018   DVT of lower extremity (deep venous thrombosis) (HCC)    recurrent. bilateral (2 episodes)   Dyspnea    home o2 when needed   Dysrhythmia    afib with cabg   Exertional angina    Treated with Isosorbide, Ranexa, amlodipine; intolerant to metoprolol   GERD (gastroesophageal reflux disease)    Gout    "on daily RX" (10/06/2018)   HLD (hyperlipidemia)    Hyperlipidemia    Hypertension    Hypothyroidism    L Subclavian Artery Stenosis    Carotid US 08/10/23: RICA 1-39, LICA 40-59, L subclavian stenosis   Obesity (BMI 30-39.9) 2009   BMI 33   On home oxygen therapy    OSA (obstructive sleep apnea)    Osteoarthritis    "all over; hands" (10/06/2018)   Osteoporosis  Oxygen dependent    4L at home as needed (10/06/2018)   PVD (peripheral vascular disease) (HCC)    Restless leg syndrome    Vitamin D deficiency     Past Surgical History:  Procedure Laterality Date   ANKLE SURGERY  2004   ANTERIOR CERVICAL DECOMP/DISCECTOMY FUSION  11/2007   AUGMENTATION MAMMAPLASTY     BACK SURGERY  1986   lower; another scheduled, opt. reports 4 back- lumbar, 3 cerv. fusions  for later 2009   CARDIAC CATHETERIZATION  11/2009   Patent LIMA to LAD and patent SVG to OM1.  Occluded SVG to ramus and diagonal. Left main: 90% ostial stenosis, LCX 60-70% proximal stenosis   CATARACT EXTRACTION W/ INTRAOCULAR LENS  IMPLANT, BILATERAL Bilateral 2006   COLONOSCOPY WITH PROPOFOL N/A 02/17/2017   Procedure: COLONOSCOPY WITH PROPOFOL;  Surgeon: Beverley Fiedler, MD;  Location: [facility] ENDOSCOPY;  Service: Endoscopy;  Laterality: N/A;   CORONARY ANGIOPLASTY WITH STENT PLACEMENT  11/2009   Drug eluting stent to left main artery: 4.0 X 12 mm Ion    CORONARY ARTERY BYPASS GRAFT  2004   "CABG X4"   ESOPHAGOGASTRODUODENOSCOPY N/A 02/14/2017   Procedure: ESOPHAGOGASTRODUODENOSCOPY (EGD);  Surgeon: Sherrilyn Rist, MD;  Location: [facility] END[facility]COPY;  Service: Endoscopy;  Laterality: N/A;   GIVENS CAPSULE STUDY N/A 02/15/2017   Procedure: GIVENS CAPSULE STUDY;  Surgeon: Sherrilyn Rist, MD;  Location: [facility] ENDOSCOPY;  Service: Endoscopy;  Laterality: N/A;   GROIN MASS OPEN BIOPSY  2004   IR IVC FILTER PLMT / S&I /IMG GUID/MOD SED  03/14/2018   IR PARACENTESIS  03/10/2018   JOINT REPLACEMENT     LAPAROSCOPIC CHOLECYSTECTOMY     PERIPHERAL VASCULAR INTERVENTION Left 03/05/2018   Procedure: PERIPHERAL VASCULAR INTERVENTION;  Surgeon: Sherren Kerns, MD;  Location: [facility] INVASIVE CV LAB;  Service: Cardiovascular;  Laterality: Left;  Attempted unsuccess\ful Per Dr. Jettie Booze   PERIPHERAL VASCULAR INTERVENTION  03/08/2018   Procedure: PERIPHERAL VASCULAR INTERVENTION;  Surgeon: Maeola Harman, MD;  Location: [facility] INVASIVE CV LAB;  Service: Cardiovascular;;  SMA Stent    REPLACEMENT TOTAL KNEE BILATERAL Bilateral 2012   REVERSE SHOULDER ARTHROPLASTY  02/26/2012   Procedure: REVERSE SHOULDER ARTHROPLASTY;  Surgeon: Senaida Lange, MD;  Location: [facility] OR;  Service: Orthopedics;  Laterality: Right;  RIGHT SHOULDER REVERSED ARTHROPLASTY   SHOULDER ARTHROSCOPY WITH SUBACROMIAL DECOMPRESSION Left 02/10/2013   Procedure: SHOULDER ARTHROSCOPY WITH SUBACROMIAL DECOMPRESSION DISTAL CLAVICLE RESECTION;  Surgeon:  Senaida Lange, MD;  Location: [facility] OR;  Service: Orthopedics;  Laterality: Left;  DISTAL CLAVICLE RESECTION   TONSILLECTOMY     TOTAL HIP ARTHROPLASTY  2007   Right   TOTAL HIP ARTHROPLASTY Left 02/07/2020   Procedure: TOTAL HIP ARTHROPLASTY ANTERIOR APPROACH;  Surgeon: Durene Romans, MD;  Location: [facility] ORS;  Service: Orthopedics;  Laterality: Left;  70 mins   TRANSURETHRAL RESECTION OF BLADDER TUMOR N/A 05/31/2018   Procedure: TRANSURETHRAL RESECTION OF BLADDER TUMOR (TURBT);  Surgeon: Crista Elliot, MD;  Location: [facility] ORS;  Service: Urology;  Laterality: N/A;   VISCERAL ANGIOGRAPHY N/A 03/05/2018   Procedure: VISCERAL ANGIOGRAPHY;  Surgeon: Sherren Kerns, MD;  Location: [facility] INVASIVE CV LAB;  Service: Cardiovascular;  Laterality: N/A;   VISCERAL ANGIOGRAPHY N/A 03/08/2018   Procedure: VISCERAL ANGIOGRAPHY;  Surgeon: Maeola Harman, MD;  Location: [facility] INVASIVE CV LAB;  Service: Cardiovascular;  Laterality: N/A;      Current Medications  Current Outpatient Medications  Medication Sig Dispense Refill  albuterol (PROVENTIL) (2.5 MG/3ML) 0.083% nebulizer solution Take 2.5 mg by nebulization every 6 (six) hours as needed for wheezing or shortness of breath.     allopurinol (ZYLOPRIM) 100 MG tablet TAKE 1 TABLET BY MOUTH DAILY WITH BREAKFAST. 90 tablet 0   alum & mag hydroxide-simeth (MAALOX MAX) 400-400-40 MG/5ML suspension Take 15 mLs by mouth every 6 (six) hours as needed for indigestion. 355 mL 0   amLODipine (NORVASC) 2.5 MG tablet Take 1/2 tablet as needed for BP greater than 160. 30 tablet 1   ARIPiprazole (ABILIFY) 2 MG tablet TAKE 1 TABLET BY MOUTH ONCE DAILY 90 tablet 0   aspirin EC 81 MG tablet Take 81 mg by mouth daily with breakfast. Swallow whole.     atorvastatin (LIPITOR) 20 MG tablet TAKE 1 TABLET ONCE DAILY AT BEDTIME 90 tablet 0   cholecalciferol (VITAMIN D3) 25 MCG (1000 UNIT) tablet Take 1 tablet (1,000 Units total) by mouth daily with supper. 90 tablet 1    citalopram (CELEXA) 10 MG tablet TAKE 1 TABLET BY MOUTH ONCE DAILY 90 tablet 0   cyclobenzaprine (FLEXERIL) 10 MG tablet Take 10 mg by mouth 3 (three) times daily as needed for muscle spasms.     D3-1000 25 MCG (1000 UT) capsule TAKE 1 CAPSULE BY MOUTH DAILY WITH SUPPER. 90 capsule 0   dapagliflozin propanediol (FARXIGA) 10 MG TABS tablet Take 1 tablet (10 mg total) by mouth daily before breakfast. 90 tablet 2   Docusate Sodium (DSS) 100 MG CAPS Take 100 mg by mouth daily as needed (constipation).     donepezil (ARICEPT) 10 MG tablet TAKE 1 TABLET BY MOUTH AT BEDTIME. 30 tablet 0   feeding supplement (ENSURE ENLIVE / ENSURE PLUS) LIQD Take 237 mLs by mouth 2 (two) times daily between meals.     ferrous sulfate 325 (65 FE) MG tablet TAKE 1 TABLET 2 TIMES PER DAY 180 tablet 0   Fluticasone-Umeclidin-Vilant (TRELEGY ELLIPTA) 200-62.5-25 MCG/ACT AEPB Inhale 1 puff into the lungs daily. 1 each 5   furosemide (LASIX) 80 MG tablet Take 1 tablet (80 mg total) by mouth every other day. PLEASE RESUME THIS MEDICATION ONCE YOU ARE EATING AND DRINKING BETTER.     gabapentin (NEURONTIN) 100 MG capsule TAKE 2 CAPSULES BY MOUTH TWICE DAILY (Patient taking differently: Take 100 mg by mouth 2 (two) times daily. Take 1 capsule by mouth in the Morning, and 2 at Night) 360 capsule 2   Ipratropium-Albuterol (COMBIVENT RESPIMAT) 20-100 MCG/ACT AERS respimat Inhale 1 puff into the lungs 3 (three) times daily as needed for wheezing or shortness of breath.     levothyroxine (SYNTHROID) 50 MCG tablet TAKE 1 TABLET BY MOUTH DAILY BEFORE BREAKFAST. 90 tablet 3   Magnesium Oxide -Mg Supplement 500 MG CAPS TAKE 1 CAPSULE BY MOUTH DAILY 90 capsule 0   Melatonin 10 MG TABS Take 10 mg by mouth at bedtime.     metoprolol tartrate (LOPRESSOR) 25 MG tablet TAKE 1/2 TABLET BY MOUTH TWICE DAILY 15 tablet 0   Multiple Vitamin (MULTIVITAMIN WITH MINERALS) TABS tablet Take 1 tablet by mouth daily.     nitroGLYCERIN (NITROSTAT) 0.4 MG SL  tablet Place 1 tablet (0.4 mg total) under the tongue every 5 (five) minutes as needed for chest pain. For chest pain 25 tablet 3   OXYGEN Inhale 4 L/min into the lungs as needed (DURING ALL TIMES OF EXERTION).      pantoprazole (PROTONIX) 40 MG tablet Take 1 tablet (40 mg total) by  mouth 2 (two) times daily. 60 tablet 0   pilocarpine (PILOCAR) 1 % ophthalmic solution Place 1 drop into both eyes 2 (two) times daily.     polyethylene glycol (MIRALAX / GLYCOLAX) 17 g packet Take 17 g by mouth as needed.     potassium chloride SA (KLOR-CON M) 20 MEQ tablet TAKE 1 TABLET ONCE DAILY WITH FOOD 90 tablet 0   pramipexole (MIRAPEX) 0.25 MG tablet TAKE 1 TABLET 2-3 HOURS BEFORE BEDTIME 90 tablet 0   spironolactone (ALDACTONE) 25 MG tablet Take 1 tablet (25 mg total) by mouth daily. PLEASE RESUME THIS MEDICATION ONCE YOU ARE EATING AND DRINKING BETTER. 90 tablet 2   traZODone (DESYREL) 50 MG tablet TAKE 3 TABLETS EVERY NIGHT AT BEDTIME. 270 tablet 0   vitamin C (ASCORBIC ACID) 500 MG tablet Take 500 mg by mouth daily with lunch.     XTAMPZA ER 9 MG C12A Take 9 mg by mouth in the morning, at noon, and at bedtime. 90 capsule 0   No current facility-administered medications for this visit.    Allergies Ativan [lorazepam], Pitavastatin, Ropinirole, Zofran [ondansetron hcl], Zolpidem tartrate, Zolpidem tartrate, and Penicillins   Social History Social History   Tobacco Use   Smoking status: Former    Current packs/day: 0.00    Average packs/day: 1 pack/day for 45.0 years (45.0 ttl pk-yrs)    Types: Cigarettes    Start date: 12/22/1953    Quit date: 12/22/1998    Years since quitting: 24.6   Smokeless tobacco: Never  Substance Use Topics   Alcohol use: Never    Alcohol/week: 0.0 standard drinks of alcohol     Review of Systems Review of Systems  Constitutional:  Negative for chills, fever, malaise/fatigue and weight loss.  Eyes:  Negative for blurred vision and double vision.  Respiratory:   Negative for cough, hemoptysis, shortness of breath and wheezing.   Cardiovascular:  Positive for leg swelling. Negative for chest pain and palpitations.  Gastrointestinal:  Positive for heartburn. Negative for abdominal pain, blood in stool, constipation, diarrhea, melena, nausea and vomiting.  Genitourinary:  Negative for frequency and hematuria.  Musculoskeletal:  Negative for myalgias.  Skin:  Negative for itching and rash.  Neurological:  Negative for dizziness, weakness and headaches.  Endo/Heme/Allergies:  Negative for polydipsia.  Psychiatric/Behavioral:  Positive for memory loss. Negative for suicidal ideas.      Physical Exam:      Body mass index is 26.26 kg/m. BP 128/72   Pulse 82   Temp 98.2 F (36.8 C)   Resp 16   Ht 5\' 5"  (1.651 m)   Wt 157 lb 12.8 oz (71.6 kg)   SpO2 96%   BMI 26.26 kg/m   Physical Exam Constitutional:      Appearance: Normal appearance. She is not ill-appearing.  HENT:     Head: Normocephalic and atraumatic.     Right Ear: Tympanic membrane, ear canal and external ear normal.     Left Ear: Tympanic membrane, ear canal and external ear normal.     Nose: Nose normal. No congestion or rhinorrhea.     Mouth/Throat:     Mouth: Mucous membranes are moist.     Pharynx: Oropharynx is clear. No posterior oropharyngeal erythema.  Eyes:     General: No scleral icterus.    Conjunctiva/sclera: Conjunctivae normal.     Pupils: Pupils are equal, round, and reactive to light.  Neck:     Thyroid: No thyromegaly.     Vascular:  No carotid bruit.  Cardiovascular:     Rate and Rhythm: Normal rate and regular rhythm.     Pulses: Normal pulses.     Heart sounds: Normal heart sounds. No murmur heard.    No friction rub. No gallop.  Pulmonary:     Effort: Pulmonary effort is normal. No respiratory distress.     Breath sounds: Normal breath sounds. No wheezing, rhonchi or rales.  Abdominal:     General: Abdomen is flat. Bowel sounds are normal. There is  no distension.     Palpations: Abdomen is soft.     Tenderness: There is no abdominal tenderness.  Musculoskeletal:     Cervical back: Normal range of motion. No tenderness.     Right lower leg: No edema.     Left lower leg: No edema.     Comments: No clubbing or cyanosis  Lymphadenopathy:     Cervical: No cervical adenopathy.  Skin:    General: Skin is warm and dry.     Findings: No rash.  Neurological:     General: No focal deficit present.     Mental Status: She is alert and oriented to person, place, and time.     Comments: CN II-XII grossly intact  Psychiatric:        Mood and Affect: Mood normal.        Behavior: Behavior normal.      Assessment:      Essential hypertension - Plan: CBC with Differential/Platelet, CMP14 + Anion Gap, Hemoglobin A1c, Lipid panel, Microalbumin / creatinine urine ratio, TSH, T4, free  PAD (peripheral artery disease) (HCC)  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HCC)  Chronic diastolic heart failure (HCC)  Coronary artery disease involving native coronary artery of native heart without angina pectoris - Plan: CBC with Differential/Platelet, CMP14 + Anion Gap, Hemoglobin A1c, Lipid panel, Microalbumin / creatinine urine ratio, TSH, T4, free  PSVT (paroxysmal supraventricular tachycardia)  Paroxysmal atrial fibrillation (HCC)  Anemia, unspecified type  Chronic pain syndrome  Stage 3b chronic kidney disease (HCC) - Plan: CBC with Differential/Platelet, CMP14 + Anion Gap, Hemoglobin A1c, Lipid panel, Microalbumin / creatinine urine ratio, TSH, T4, free  Cirrhosis of liver without ascites, unspecified hepatic cirrhosis type (HCC) - Plan: CBC with Differential/Platelet, CMP14 + Anion Gap, Hemoglobin A1c, Lipid panel, Microalbumin / creatinine urine ratio, TSH, T4, free  Chronic obstructive pulmonary disease, unspecified COPD type (HCC)  Mild episode of recurrent major depressive disorder (HCC)  GAD (generalized anxiety disorder)  GERD without  esophagitis  Chronic gout of multiple sites, unspecified cause  Chronic respiratory failure with hypoxia (HCC)  Hypothyroidism, unspecified type - Plan: TSH, T4, free  Age-related osteoporosis without current pathological fracture  OSA (obstructive sleep apnea)  Primary osteoarthritis involving multiple joints  RLS (restless legs syndrome)  BMI 26.0-26.9,adult  Bilateral carotid artery stenosis  Deep tissue injury  Dysphagia, unspecified type  Chronic venous insufficiency  AVM (arteriovenous malformation) of small bowel, acquired  History of DVT (deep vein thrombosis)  History of pulmonary embolism  Memory loss  L Subclavian Artery Stenosis  Dyslipidemia    Plan:     During the course of the visit the patient was educated and counseled about appropriate screening and preventive services including:   Pneumococcal vaccine  Influenza vaccine Colorectal cancer screening Advanced directives: discussed  Diet review for nutrition referral? Yes ____  Not Indicated _X___   Patient Instructions (the written plan) was given to the patient.  PSVT (paroxysmal supraventricular tachycardia) She had a zio  patch done in 09/2022 that showed the patient was in atrial flutter 100% of time.  Cardiology had seen her and again did not recommend anticoagulation due to a history of GI bleeding and AVM's.  She is currently on metoprolol at this time.    Paroxysmal atrial fibrillation (HCC) Her A. Fib is currently rate controlled.  Again, she had a zio patch done in 09/2022 that showed the patient was in atrial flutter 100% of time.  Cardiology had seen her and again did not recommend anticoagulation due to a history of GI bleeding and AVM's.  She is currently on metoprolol at this time.    PAD (peripheral artery disease) (HCC) She has a history of PAD and carotid stenosis bilaterally.  Cardiology is following her and doing carotid US once a year.  There has been no change this past  year.  We will continue on risk factor modificaiton.  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HCC) She had abdominal pain in 2019 and during workup they noted she had a SMA stenosis where she had a stent placerd at that time.  She has had chronic abdominal pain due to recurrent mesenteric symptoms where she was in the ER multiple times in 2019 with abdominal pain.  She returned repeat imaging at those times.  Today, this does not seem to be a problem.  We will continue with PAD management.  L Subclavian Artery Stenosis She had a carotid US and [place] of her left subclavian artery on 08/11/2023 that showed that her left subclavian artery was stenotic. Normal flow hemodynamics were seen in the right subclavian artery.  Otherwise there was no significant change compared to 1 year ago.  Cardiology will continue with surveillance.  Essential hypertension Her BP is currently controlled.  We will continue with her current BP medications.  Coronary artery disease involving native coronary artery of native heart without angina pectoris She has CAD with history of CABG x 3 in 2004.  She last saw cardiology in 10/2022 and she has ben stable.  We will continue with risk factor modification and continue her ASA 81mg  daily.  Chronic venous insufficiency She has venous dermatitis and skin color changes with edema of both legs consistent with chronic venous insufficiency.  She does wear compression hose.  She has some worsening edema today with one blister located on her right calf.  I want her to start taking her lasix daily for the next 5 days.  She is to avoid salt and excess water and elevate her legs and continue on compression hose.  This redness of her legs is not cellulitis.  Chronic diastolic heart failure (HCC) She has chronic diastolic CHF where she has some edema today.  Again, we will increase her lasix to 80mg  every day for the next 5 days and then go back to every other day.  Carotid artery disease (HCC) Her  carotid US was as above.  We will continue risk factor modification and continue her ASA.  Cardiology is doing yearly surveillance.  AVM (arteriovenous malformation) of small bowel, acquired She has had GIB in the past where she has small bowel AVM's.  We have been checking her blood counts and we will do this again today.  She is not a candidate for anticoagulation.  OSA (obstructive sleep apnea) She has OSA and is not compliant with her CPAP.  We will try to get her to lose some weight.  Chronic respiratory failure with hypoxia (HCC) She chronic respiratory failure due to her diastolic  dysfunction and COPD.  She is stable on her current oxygen via [place].  GERD without esophagitis She is having some reflux symptoms and we have increased her protonix to 40mg  BID (see below).  Dysphagia She has developed some dysphagia where she has an appointment with GI on 08/31/2023.  Continue on her PPI.  Cirrhosis of liver without ascites (HCC) She has cirrhosis but no ascites are noted.  She is on lasix and spironolactone.  If she has continued problems with edema, we can increase her spironolactone to 100mg  daily.  We will check her liver enzymes today.  Hypothyroidism She seems euthyroid.  We will recheck her TFT's today and continue on her supplementation.  Age-related osteoporosis without current pathological fracture She has been on prolia but has not had an injection done in years.  We will have a discussion with her on her next visit.  I will check a Vitamin D level today.  Stage 3b chronic kidney disease (HCC) She has stage IIIb CKD probably related to her hypertension.  We will continue to avoid nephrotoxins.  We will obtain urine studies today.  She will continue on farxiga at this time.  RLS (restless legs syndrome) She only uses mirapex as needed at this time.  Memory loss Her repeat MMSE today is the same as it was 7-8 months ago.  She is currently on aricept.  We will see how she is  doing on her next visit and decide whether to also start her on namenda.   Her memory is currently stable.  History of pulmonary embolism She has a history of DVT and PE but cannot be anticoagulated due to her history of GIB and AVM's.  She does have an IVC filter in place.  History of DVT (deep vein thrombosis) Plan as above.  GAD (generalized anxiety disorder) Her depression and anxiety seem to be controlled today on my exam.  She had stopped her abilify this past year and her depression worsened.   We will need to continue on her current medications.  Dyslipidemia She has hyperlipidemia with a history of CAD.  She is on lipitor at this time.  We will recheck her LFT's today as she has a history of cirrhosis.    Depression Plan as above.  Deep tissue injury She has DTI's to her bilateral lower buttocks that are less than 1cm in diameter.  I want her to continue to be active and getting up.  She can go back to using calmoseptine at this time.  Chronic pain She has chronic pain syndrome where she remains on Xtampza 9mg  BID which helps her tolerate her pain at this time.  Chronic gout She will continue on allopurinol at this time.  She has not had any recent gout attacks.  BMI 26.0-26.9,adult I want her to eat healthy and continue to be active.  Anemia We will continue to monitor her BP counts at this time.   Prevention Health maintenance discussed.  She does not want anymore mammograms.  We will obtain some yearly labs.   Medicare Attestation I have personally reviewed: The patient's medical and social history Their use of alcohol, tobacco or illicit drugs Their current medications and supplements The patient's functional ability including ADLs,fall risks, home safety risks, cognitive, and hearing and visual impairment Diet and physical activities Evidence for depression or mood disorders  The patient's weight, height, and BMI have been recorded in the chart.  I have made  referrals, counseling, and provided education to the  patient based on review of the above and I have provided the patient with a written personalized care plan for preventive services.     Crist Fat, MD   08/20/2023

## 2023-08-20 NOTE — Assessment & Plan Note (Signed)
We will continue to monitor her BP counts at this time.

## 2023-08-20 NOTE — Assessment & Plan Note (Signed)
She has been on prolia but has not had an injection done in years.  We will have a discussion with her on her next visit.  I will check a Vitamin D level today.

## 2023-08-20 NOTE — Assessment & Plan Note (Signed)
Her carotid US was as above.  We will continue risk factor modification and continue her ASA.  Cardiology is doing yearly surveillance.

## 2023-08-20 NOTE — Assessment & Plan Note (Signed)
She has OSA and is not compliant with her CPAP.  We will try to get her to lose some weight.

## 2023-08-20 NOTE — Assessment & Plan Note (Signed)
She has developed some dysphagia where she has an appointment with GI on 08/31/2023.  Continue on her PPI.

## 2023-08-20 NOTE — Assessment & Plan Note (Signed)
Her BP is currently controlled.  We will continue with her current BP medications.

## 2023-08-20 NOTE — Assessment & Plan Note (Signed)
She has chronic pain syndrome where she remains on Xtampza 9mg  BID which helps her tolerate her pain at this time.

## 2023-08-20 NOTE — Assessment & Plan Note (Signed)
I want her to eat healthy and continue to be active.

## 2023-08-20 NOTE — Assessment & Plan Note (Signed)
Plan as above.  

## 2023-08-20 NOTE — Assessment & Plan Note (Signed)
She has a history of DVT and PE but cannot be anticoagulated due to her history of GIB and AVM's.  She does have an IVC filter in place.

## 2023-08-20 NOTE — Assessment & Plan Note (Signed)
She is having some reflux symptoms and we have increased her protonix to 40mg  BID (see below).

## 2023-08-20 NOTE — Assessment & Plan Note (Signed)
Her repeat MMSE today is the same as it was 7-8 months ago.  She is currently on aricept.  We will see how she is doing on her next visit and decide whether to also start her on namenda.   Her memory is currently stable.

## 2023-08-20 NOTE — Assessment & Plan Note (Signed)
She only uses mirapex as needed at this time.

## 2023-08-20 NOTE — Assessment & Plan Note (Signed)
She has cirrhosis but no ascites are noted.  She is on lasix and spironolactone.  If she has continued problems with edema, we can increase her spironolactone to 100mg  daily.  We will check her liver enzymes today.

## 2023-08-20 NOTE — Assessment & Plan Note (Signed)
She had a carotid US and [place] of her left subclavian artery on 08/11/2023 that showed that her left subclavian artery was stenotic. Normal flow hemodynamics were seen in the right subclavian artery.  Otherwise there was no significant change compared to 1 year ago.  Cardiology will continue with surveillance.

## 2023-08-20 NOTE — Assessment & Plan Note (Signed)
She has venous dermatitis and skin color changes with edema of both legs consistent with chronic venous insufficiency.  She does wear compression hose.  She has some worsening edema today with one blister located on her right calf.  I want her to start taking her lasix daily for the next 5 days.  She is to avoid salt and excess water and elevate her legs and continue on compression hose.  This redness of her legs is not cellulitis.

## 2023-08-21 LAB — CBC WITH DIFFERENTIAL/PLATELET
Basophils Absolute: 0.1 10*3/uL (ref 0.0–0.2)
Basos: 1 %
EOS (ABSOLUTE): 0.1 10*3/uL (ref 0.0–0.4)
Eos: 1 %
Hematocrit: 43 % (ref 34.0–46.6)
Hemoglobin: 14 g/dL (ref 11.1–15.9)
Immature Grans (Abs): 0.1 10*3/uL (ref 0.0–0.1)
Immature Granulocytes: 1 %
Lymphocytes Absolute: 1.7 10*3/uL (ref 0.7–3.1)
Lymphs: 21 %
MCH: 29.6 pg (ref 26.6–33.0)
MCHC: 32.6 g/dL (ref 31.5–35.7)
MCV: 91 fL (ref 79–97)
Monocytes Absolute: 0.7 10*3/uL (ref 0.1–0.9)
Monocytes: 8 %
Neutrophils Absolute: 5.8 10*3/uL (ref 1.4–7.0)
Neutrophils: 68 %
Platelets: 224 10*3/uL (ref 150–450)
RBC: 4.73 x10E6/uL (ref 3.77–5.28)
RDW: 13.2 % (ref 11.7–15.4)
WBC: 8.5 10*3/uL (ref 3.4–10.8)

## 2023-08-21 LAB — LIPID PANEL
Chol/HDL Ratio: 1.9 ratio (ref 0.0–4.4)
Cholesterol, Total: 101 mg/dL (ref 100–199)
HDL: 52 mg/dL (ref >39)
LDL Chol Calc (NIH): 33 mg/dL (ref 0–99)
Triglycerides: 79 mg/dL (ref 0–149)
VLDL Cholesterol Cal: 16 mg/dL (ref 5–40)

## 2023-08-21 LAB — HEMOGLOBIN A1C
Est. average glucose Bld gHb Est-mCnc: 111 mg/dL
Hgb A1c MFr Bld: 5.5 % (ref 4.8–5.6)

## 2023-08-21 LAB — CMP14 + ANION GAP
ALT: 8 IU/L (ref 0–32)
AST: 12 IU/L (ref 0–40)
Albumin: 4 g/dL (ref 3.7–4.7)
Alkaline Phosphatase: 92 IU/L (ref 44–121)
Anion Gap: 11 mmol/L (ref 10.0–18.0)
BUN/Creatinine Ratio: 14 (ref 12–28)
BUN: 16 mg/dL (ref 8–27)
Bilirubin Total: 0.8 mg/dL (ref 0.0–1.2)
CO2: 28 mmol/L (ref 20–29)
Calcium: 10.1 mg/dL (ref 8.7–10.3)
Chloride: 100 mmol/L (ref 96–106)
Creatinine, Ser: 1.16 mg/dL — ABNORMAL HIGH (ref 0.57–1.00)
Globulin, Total: 2.7 g/dL (ref 1.5–4.5)
Glucose: 93 mg/dL (ref 70–99)
Potassium: 4.6 mmol/L (ref 3.5–5.2)
Sodium: 139 mmol/L (ref 134–144)
Total Protein: 6.7 g/dL (ref 6.0–8.5)
eGFR: 47 mL/min/{1.73_m2} — ABNORMAL LOW (ref >59)

## 2023-08-21 LAB — TSH: TSH: 4.3 u[IU]/mL (ref 0.450–4.500)

## 2023-08-21 LAB — MICROALBUMIN / CREATININE URINE RATIO
Creatinine, Urine: 97.7 mg/dL
Microalb/Creat Ratio: 17 mg/g{creat} (ref 0–29)
Microalbumin, Urine: 16.4 ug/mL

## 2023-08-21 LAB — T4, FREE: Free T4: 1.21 ng/dL (ref 0.82–1.77)

## 2023-08-27 ENCOUNTER — Other Ambulatory Visit: Payer: Self-pay

## 2023-08-27 MED ORDER — TRELEGY ELLIPTA 200-62.5-25 MCG/ACT IN AEPB: 1.0000 | INHALATION_SPRAY | Freq: Every day | RESPIRATORY_TRACT | 5 refills | Status: DC

## 2023-08-27 NOTE — Progress Notes (Signed)
Her labs look good.  There is no change to her CKD.  Contacted patient/ Patient granddaughter, made aware of lab results.

## 2023-08-31 DIAGNOSIS — R1310 Dysphagia, unspecified: Secondary | ICD-10-CM | POA: Diagnosis not present

## 2023-08-31 DIAGNOSIS — K219 Gastro-esophageal reflux disease without esophagitis: Secondary | ICD-10-CM | POA: Diagnosis not present

## 2023-09-03 ENCOUNTER — Other Ambulatory Visit: Payer: Self-pay | Admitting: Internal Medicine

## 2023-09-03 ENCOUNTER — Other Ambulatory Visit: Payer: Self-pay

## 2023-09-03 DIAGNOSIS — I5032 Chronic diastolic (congestive) heart failure: Secondary | ICD-10-CM | POA: Diagnosis not present

## 2023-09-03 DIAGNOSIS — S41102D Unspecified open wound of left upper arm, subsequent encounter: Secondary | ICD-10-CM | POA: Diagnosis not present

## 2023-09-03 DIAGNOSIS — E1122 Type 2 diabetes mellitus with diabetic chronic kidney disease: Secondary | ICD-10-CM | POA: Diagnosis not present

## 2023-09-03 DIAGNOSIS — I5022 Chronic systolic (congestive) heart failure: Secondary | ICD-10-CM | POA: Diagnosis not present

## 2023-09-03 DIAGNOSIS — J439 Emphysema, unspecified: Secondary | ICD-10-CM | POA: Diagnosis not present

## 2023-09-03 DIAGNOSIS — S0101XD Laceration without foreign body of scalp, subsequent encounter: Secondary | ICD-10-CM | POA: Diagnosis not present

## 2023-09-03 DIAGNOSIS — J9611 Chronic respiratory failure with hypoxia: Secondary | ICD-10-CM | POA: Diagnosis not present

## 2023-09-03 DIAGNOSIS — K529 Noninfective gastroenteritis and colitis, unspecified: Secondary | ICD-10-CM

## 2023-09-03 DIAGNOSIS — S8002XD Contusion of left knee, subsequent encounter: Secondary | ICD-10-CM | POA: Diagnosis not present

## 2023-09-03 DIAGNOSIS — I130 Hypertensive heart and chronic kidney disease with heart failure and stage 1 through stage 4 chronic kidney disease, or unspecified chronic kidney disease: Secondary | ICD-10-CM | POA: Diagnosis not present

## 2023-09-03 MED ORDER — MAGNESIUM OXIDE -MG SUPPLEMENT 500 MG PO CAPS: 1.0000 | ORAL_CAPSULE | Freq: Every day | ORAL | 0 refills | Status: DC

## 2023-09-03 NOTE — Progress Notes (Signed)
RX Refill 

## 2023-09-04 DIAGNOSIS — J9611 Chronic respiratory failure with hypoxia: Secondary | ICD-10-CM | POA: Diagnosis not present

## 2023-09-04 DIAGNOSIS — I130 Hypertensive heart and chronic kidney disease with heart failure and stage 1 through stage 4 chronic kidney disease, or unspecified chronic kidney disease: Secondary | ICD-10-CM | POA: Diagnosis not present

## 2023-09-04 DIAGNOSIS — I5022 Chronic systolic (congestive) heart failure: Secondary | ICD-10-CM | POA: Diagnosis not present

## 2023-09-04 DIAGNOSIS — I5032 Chronic diastolic (congestive) heart failure: Secondary | ICD-10-CM | POA: Diagnosis not present

## 2023-09-04 DIAGNOSIS — S41102D Unspecified open wound of left upper arm, subsequent encounter: Secondary | ICD-10-CM | POA: Diagnosis not present

## 2023-09-04 DIAGNOSIS — S0101XD Laceration without foreign body of scalp, subsequent encounter: Secondary | ICD-10-CM | POA: Diagnosis not present

## 2023-09-04 DIAGNOSIS — E1122 Type 2 diabetes mellitus with diabetic chronic kidney disease: Secondary | ICD-10-CM | POA: Diagnosis not present

## 2023-09-04 DIAGNOSIS — S8002XD Contusion of left knee, subsequent encounter: Secondary | ICD-10-CM | POA: Diagnosis not present

## 2023-09-04 DIAGNOSIS — J439 Emphysema, unspecified: Secondary | ICD-10-CM | POA: Diagnosis not present

## 2023-09-07 DIAGNOSIS — I130 Hypertensive heart and chronic kidney disease with heart failure and stage 1 through stage 4 chronic kidney disease, or unspecified chronic kidney disease: Secondary | ICD-10-CM | POA: Diagnosis not present

## 2023-09-07 DIAGNOSIS — S41102D Unspecified open wound of left upper arm, subsequent encounter: Secondary | ICD-10-CM | POA: Diagnosis not present

## 2023-09-07 DIAGNOSIS — I5032 Chronic diastolic (congestive) heart failure: Secondary | ICD-10-CM | POA: Diagnosis not present

## 2023-09-07 DIAGNOSIS — S0101XD Laceration without foreign body of scalp, subsequent encounter: Secondary | ICD-10-CM | POA: Diagnosis not present

## 2023-09-07 DIAGNOSIS — I5022 Chronic systolic (congestive) heart failure: Secondary | ICD-10-CM | POA: Diagnosis not present

## 2023-09-07 DIAGNOSIS — S8002XD Contusion of left knee, subsequent encounter: Secondary | ICD-10-CM | POA: Diagnosis not present

## 2023-09-07 DIAGNOSIS — J9611 Chronic respiratory failure with hypoxia: Secondary | ICD-10-CM | POA: Diagnosis not present

## 2023-09-07 DIAGNOSIS — E1122 Type 2 diabetes mellitus with diabetic chronic kidney disease: Secondary | ICD-10-CM | POA: Diagnosis not present

## 2023-09-07 DIAGNOSIS — J439 Emphysema, unspecified: Secondary | ICD-10-CM | POA: Diagnosis not present

## 2023-09-10 DIAGNOSIS — J439 Emphysema, unspecified: Secondary | ICD-10-CM | POA: Diagnosis not present

## 2023-09-10 DIAGNOSIS — S8002XD Contusion of left knee, subsequent encounter: Secondary | ICD-10-CM | POA: Diagnosis not present

## 2023-09-10 DIAGNOSIS — I5032 Chronic diastolic (congestive) heart failure: Secondary | ICD-10-CM | POA: Diagnosis not present

## 2023-09-10 DIAGNOSIS — I5022 Chronic systolic (congestive) heart failure: Secondary | ICD-10-CM | POA: Diagnosis not present

## 2023-09-10 DIAGNOSIS — J9611 Chronic respiratory failure with hypoxia: Secondary | ICD-10-CM | POA: Diagnosis not present

## 2023-09-10 DIAGNOSIS — E1122 Type 2 diabetes mellitus with diabetic chronic kidney disease: Secondary | ICD-10-CM | POA: Diagnosis not present

## 2023-09-10 DIAGNOSIS — S41102D Unspecified open wound of left upper arm, subsequent encounter: Secondary | ICD-10-CM | POA: Diagnosis not present

## 2023-09-10 DIAGNOSIS — S0101XD Laceration without foreign body of scalp, subsequent encounter: Secondary | ICD-10-CM | POA: Diagnosis not present

## 2023-09-10 DIAGNOSIS — I130 Hypertensive heart and chronic kidney disease with heart failure and stage 1 through stage 4 chronic kidney disease, or unspecified chronic kidney disease: Secondary | ICD-10-CM | POA: Diagnosis not present

## 2023-09-14 DIAGNOSIS — S41102D Unspecified open wound of left upper arm, subsequent encounter: Secondary | ICD-10-CM | POA: Diagnosis not present

## 2023-09-14 DIAGNOSIS — I130 Hypertensive heart and chronic kidney disease with heart failure and stage 1 through stage 4 chronic kidney disease, or unspecified chronic kidney disease: Secondary | ICD-10-CM | POA: Diagnosis not present

## 2023-09-14 DIAGNOSIS — S8002XD Contusion of left knee, subsequent encounter: Secondary | ICD-10-CM | POA: Diagnosis not present

## 2023-09-14 DIAGNOSIS — J439 Emphysema, unspecified: Secondary | ICD-10-CM | POA: Diagnosis not present

## 2023-09-14 DIAGNOSIS — I5022 Chronic systolic (congestive) heart failure: Secondary | ICD-10-CM | POA: Diagnosis not present

## 2023-09-14 DIAGNOSIS — S0101XD Laceration without foreign body of scalp, subsequent encounter: Secondary | ICD-10-CM | POA: Diagnosis not present

## 2023-09-14 DIAGNOSIS — E1122 Type 2 diabetes mellitus with diabetic chronic kidney disease: Secondary | ICD-10-CM | POA: Diagnosis not present

## 2023-09-14 DIAGNOSIS — J9611 Chronic respiratory failure with hypoxia: Secondary | ICD-10-CM | POA: Diagnosis not present

## 2023-09-14 DIAGNOSIS — I5032 Chronic diastolic (congestive) heart failure: Secondary | ICD-10-CM | POA: Diagnosis not present

## 2023-09-28 ENCOUNTER — Other Ambulatory Visit: Payer: Self-pay | Admitting: Internal Medicine

## 2023-09-30 ENCOUNTER — Other Ambulatory Visit: Payer: Self-pay | Admitting: Internal Medicine

## 2023-09-30 MED ORDER — XTAMPZA ER 9 MG PO C12A: 9.0000 mg | EXTENDED_RELEASE_CAPSULE | Freq: Three times a day (TID) | ORAL | 0 refills | Status: DC

## 2023-10-07 ENCOUNTER — Other Ambulatory Visit: Payer: Self-pay | Admitting: Internal Medicine

## 2023-10-07 DIAGNOSIS — I471 Supraventricular tachycardia, unspecified: Secondary | ICD-10-CM

## 2023-10-08 DIAGNOSIS — K3189 Other diseases of stomach and duodenum: Secondary | ICD-10-CM | POA: Diagnosis not present

## 2023-10-08 DIAGNOSIS — K317 Polyp of stomach and duodenum: Secondary | ICD-10-CM | POA: Diagnosis not present

## 2023-10-08 DIAGNOSIS — I10 Essential (primary) hypertension: Secondary | ICD-10-CM | POA: Diagnosis not present

## 2023-10-08 DIAGNOSIS — K219 Gastro-esophageal reflux disease without esophagitis: Secondary | ICD-10-CM | POA: Diagnosis not present

## 2023-10-08 DIAGNOSIS — R1310 Dysphagia, unspecified: Secondary | ICD-10-CM | POA: Diagnosis not present

## 2023-10-08 DIAGNOSIS — K2970 Gastritis, unspecified, without bleeding: Secondary | ICD-10-CM | POA: Diagnosis not present

## 2023-10-08 DIAGNOSIS — K259 Gastric ulcer, unspecified as acute or chronic, without hemorrhage or perforation: Secondary | ICD-10-CM | POA: Diagnosis not present

## 2023-10-08 DIAGNOSIS — D131 Benign neoplasm of stomach: Secondary | ICD-10-CM | POA: Diagnosis not present

## 2023-10-23 ENCOUNTER — Other Ambulatory Visit: Payer: Self-pay | Admitting: Internal Medicine

## 2023-10-26 ENCOUNTER — Other Ambulatory Visit: Payer: Self-pay | Admitting: Internal Medicine

## 2023-10-26 MED ORDER — XTAMPZA ER 9 MG PO C12A: 9.0000 mg | EXTENDED_RELEASE_CAPSULE | Freq: Three times a day (TID) | ORAL | 0 refills | Status: DC

## 2023-11-02 ENCOUNTER — Other Ambulatory Visit: Payer: Self-pay | Admitting: Internal Medicine

## 2023-11-02 DIAGNOSIS — I471 Supraventricular tachycardia, unspecified: Secondary | ICD-10-CM

## 2023-11-11 ENCOUNTER — Other Ambulatory Visit: Payer: Self-pay | Admitting: Internal Medicine

## 2023-11-12 ENCOUNTER — Other Ambulatory Visit: Payer: Self-pay

## 2023-11-12 MED ORDER — CITALOPRAM HYDROBROMIDE 10 MG PO TABS: 10.0000 mg | ORAL_TABLET | Freq: Every day | ORAL | 0 refills | Status: DC

## 2023-11-12 MED ORDER — FERROUS SULFATE 325 (65 FE) MG PO TABS: 325.0000 mg | ORAL_TABLET | Freq: Two times a day (BID) | ORAL | 0 refills | Status: DC

## 2023-11-12 MED ORDER — ALLOPURINOL 100 MG PO TABS: 100.0000 mg | ORAL_TABLET | Freq: Every day | ORAL | 0 refills | Status: DC

## 2023-11-12 MED ORDER — DONEPEZIL HCL 10 MG PO TABS: 10.0000 mg | ORAL_TABLET | Freq: Every day | ORAL | 0 refills | Status: DC

## 2023-11-12 MED ORDER — ATORVASTATIN CALCIUM 20 MG PO TABS: 20.0000 mg | ORAL_TABLET | Freq: Every day | ORAL | 0 refills | Status: DC

## 2023-11-12 NOTE — Progress Notes (Signed)
Rx refill

## 2023-11-13 ENCOUNTER — Other Ambulatory Visit: Payer: Self-pay | Admitting: Internal Medicine

## 2023-11-13 ENCOUNTER — Other Ambulatory Visit: Payer: Self-pay

## 2023-11-13 MED ORDER — CYCLOBENZAPRINE HCL 10 MG PO TABS: 10.0000 mg | ORAL_TABLET | Freq: Three times a day (TID) | ORAL | 0 refills | Status: DC

## 2023-11-13 NOTE — Progress Notes (Signed)
Rx Refill

## 2023-11-16 ENCOUNTER — Other Ambulatory Visit: Payer: Self-pay | Admitting: Internal Medicine

## 2023-11-21 ENCOUNTER — Other Ambulatory Visit: Payer: Self-pay | Admitting: Internal Medicine

## 2023-11-21 DIAGNOSIS — I471 Supraventricular tachycardia, unspecified: Secondary | ICD-10-CM

## 2023-11-23 ENCOUNTER — Ambulatory Visit: Payer: Medicare HMO | Admitting: Internal Medicine

## 2023-12-02 ENCOUNTER — Ambulatory Visit: Payer: Medicare HMO | Admitting: Internal Medicine

## 2023-12-02 ENCOUNTER — Encounter: Payer: Self-pay | Admitting: Internal Medicine

## 2023-12-02 ENCOUNTER — Other Ambulatory Visit: Payer: Self-pay

## 2023-12-02 VITALS — BP 138/88 | HR 110 | Temp 98.6°F | Resp 20 | Ht 67.0 in | Wt 167.6 lb

## 2023-12-02 DIAGNOSIS — I5032 Chronic diastolic (congestive) heart failure: Secondary | ICD-10-CM | POA: Diagnosis not present

## 2023-12-02 DIAGNOSIS — J441 Chronic obstructive pulmonary disease with (acute) exacerbation: Secondary | ICD-10-CM

## 2023-12-02 DIAGNOSIS — R413 Other amnesia: Secondary | ICD-10-CM

## 2023-12-02 DIAGNOSIS — M810 Age-related osteoporosis without current pathological fracture: Secondary | ICD-10-CM | POA: Diagnosis not present

## 2023-12-02 MED ORDER — MEMANTINE HCL 5 MG PO TABS: 5.0000 mg | ORAL_TABLET | Freq: Two times a day (BID) | ORAL | 2 refills | Status: DC

## 2023-12-02 MED ORDER — DOXYCYCLINE MONOHYDRATE 100 MG PO CAPS: 100.0000 mg | ORAL_CAPSULE | Freq: Two times a day (BID) | ORAL | 0 refills | Status: DC

## 2023-12-02 MED ORDER — METHYLPREDNISOLONE 4 MG PO TBPK: ORAL_TABLET | ORAL | 0 refills | Status: DC

## 2023-12-02 NOTE — Assessment & Plan Note (Signed)
I am going to start her on namenda for her memory.  WE will see her back in 3 months.

## 2023-12-02 NOTE — Assessment & Plan Note (Signed)
She has no signs of volume overload.  We will continue on her lasix and other medications at this time.

## 2023-12-02 NOTE — Progress Notes (Signed)
Office Visit  Subjective   Patient ID: [name]   DOB: 01/29/1941   Age: 82 y.o.   MRN: [id number]   Chief Complaint Chief Complaint  Patient presents with   Follow-up    Complains of being tired and can't catch her breath     History of Present Illness [name] is a 82 yo female who comes in today also for an acute visit with respiratory problems.  She states that her symptoms started 3 weeks ago where she was has been having sinus congestion with post nasal drip as well as chest congestion with worsening SOB and wheezing.  There has been no fevers, chills, headaches, myalgias, cough, sore throat, nasal discharge, or any n/v or diarrhea.  She has not tried anything OTC.  She has a history of COPD and is currently compliant with her trelegy.  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  She has an albuterol neb machine that she uses twice a day usually but has not had to go up on it's use with her current sickness.  She is usually on 3L of oxygen during the day but not at night.    We are also following [name] for some memory loss where we saw her in 12/2022.  Her daughter stated they had noted some memory changes that occurred since 11/2022.  She had some difficulty remembering her daughter and difficulty remembering to buckle her seat belt.  She is forgetting words as she is starting to talk.  Again, the daugther states that there is no worsening depression where we noted this was controlled in 12/2022.  Her MMSE 04/2022 was 28/30 and in 12/2022 she scored a 24/30.  We did lab testing at that time and her labs were normal except for her CKD and her TSH was a bit elevated.  Today on 07/2023, she scored a 24/30 on her MMSE.  Her family members states that there has been no change to her memory since her evaluation in 12/2022.  IF she gets panicked, she can get confused.  We did start her on aricept 10mg  nightly where she denies any diarrhea.   Today, she feels  she has had some worsening memory.  The patient denies any worsening depression.   [name] also has a history of chronic diastolic congestive heart failure.  She did see cardiology on 07/2021 where they wanted to start her on farxiga.  Her last visit with cardiology was in 02/2023 and they felt she was stable.    She was previously on Lasix 80mg  daily but when she saw cardiology they recommended to take this as needed.  She admits today that she is taking Lasix 80mg  every other day.  She is also on spironolactone 25mg  which she does take daily.  Again, she has had multiple admissions to the hospital in the past for acute on chronic respiratory failure due to her COPD and diastolic CHF. She is not currently on fluid restriction.  She continues to do daily weights where we believe her dry weight is around 167-168 lb.  The patient does have a history of Stage III CKD and a history of cirrhosis and CAD.  Her last ECHO was done on 06/06/2022 and this showed a LVEF 60-65% with normal function and without regional wall motion abnormalities.  There was mild LVH and her LV diastolic parameters were indeterminate.  Her RV systolic function was normal.  Her bilateral atrium were  moderately dilated.  There was mild MV regurgitaiton.  There was severe mitral annular calcification.  She had mild AV regurgitation.  She had a previous ECHO in 10/09/2020 and this showed a normal LVEF of 60-65% with diastolic dysfunction.  She had no regional wall motion abnormalites however she had moderate concentric LVH.  She had moderate bilateral atrial enlargement.  Today, there is no chest pain, no worsening SOB, palpitations but she does have some peripheral edema.  On her last visit, she was noted to have some lower extremity edema.  I asked her to increase her lasix to 80mg  daily x 5 days then go back to lasix 80mg  every other day.  This did help with her edema and she is not having any problems today.     The patient also has a  history of osteoporosis.  She denies a family history of osteoporosis. There are no risk factors. She denies the following: smoking, diabetes mellitus, high caffeine intake, alcohol consumption of more that 7 ounces per week, daily prednisone use, hyperthyroidism, surgical resection of her bowel, and surgical resection of her stomach. She states she does not exercise routinely.  We did perform a bone denisty on 12/2013 and this showed that she had osteopenia.  I did repeat a bone density on 01/2016 and this showed a tscore of -2.8 and we placed her on prolia with her first injection on 02/11/2016.  Her last prolia injection was done on 04/2017.  She has never been on a bisphospanate.  We tried putting her on fosamax in the past but she did not take this.          Past Medical History Past Medical History:  Diagnosis Date   Anemia    bld. transfusion post lumbar surgery- 2012   Anxiety    Arthralgia    NOS   CAD (coronary artery disease)    s/p CABG 2004; s/p DES to LM in 2010;  LHC 10/29/11: EF 50-55%, mild elevated filling pressures, no pulmonary HTN, LM 90% ISR, LAD and CFX occluded, S-RI occluded (old), S-OM3 ok and L-LAD ok, native nondominant RCA 95% -  med rx recommended ; Lexiscan Myoview 7/13 at [facility]: demonstrated "normal LV function, anterior attenuation and localized ischemia, inferior, basilar, mid section"   Carotid artery disease (HCC)    Carotid US 6/19:  RICA 1-39; LICA 40-59; R subclavian stenosis - Repeat 1 year. // Carotid US 05/2019: R 1-39; L 40-59; R vertebral with atypical antegrade flow; R subclavian stenosis >> repeat 1 year // Carotid US 7/21: Bilat 40-59; L subclavian stenosis // Carotid US 7/22: Bilat ICA 40-59; R subclavian stenosis // Carotid US 08/10/23: RICA 1-39, LICA 40-59, L subclavian stenosis   CHF (congestive heart failure) (HCC) 01/21/2020   Chronic diastolic heart failure (HCC)    Echo 9/10: EF 60-55%, grade 1 diastolic dysfunction   Chronic lower back  pain    Chronic respiratory failure (HCC)    Cirrhosis (HCC)    CKD (chronic kidney disease), stage III (HCC)    COPD (chronic obstructive pulmonary disease) (HCC)    Emphysema dxed by Dr. Sherril Croon in [place] based on PFTs per pt in 2006; placed on albuterol   Degenerative disc disease, lumbar    Depression    TAKES CELEXA  AND  (OFF- WELBUTRIN)   Diuretic-induced hypokalemia 10/08/2018   DVT of lower extremity (deep venous thrombosis) (HCC)    recurrent. bilateral (2 episodes)   Dyspnea    home o2 when  needed   Dysrhythmia    afib with cabg   Exertional angina (HCC)    Treated with Isosorbide, Ranexa, amlodipine; intolerant to metoprolol   GERD (gastroesophageal reflux disease)    Gout    "on daily RX" (10/06/2018)   HLD (hyperlipidemia)    Hyperlipidemia    Hypertension    Hypothyroidism    L Subclavian Artery Stenosis    Carotid US 08/10/23: RICA 1-39, LICA 40-59, L subclavian stenosis   Obesity (BMI 30-39.9) 2009   BMI 33   On home oxygen therapy    OSA (obstructive sleep apnea)    Osteoarthritis    "all over; hands" (10/06/2018)   Osteoporosis    Oxygen dependent    4L at home as needed (10/06/2018)   PVD (peripheral vascular disease) (HCC)    Restless leg syndrome    Vitamin D deficiency      Allergies Allergies  Allergen Reactions   Ativan [Lorazepam] Other (See Comments)    Confusion    Pitavastatin Itching and Other (See Comments)    Reaction to Livalo   Ropinirole Nausea Only and Other (See Comments)    Makes legs jump, also   Zofran [Ondansetron Hcl] Nausea Only   Zolpidem Tartrate Anxiety and Other (See Comments)    CONFUSION    Zolpidem Tartrate Swelling    CONFUSION   Penicillins Rash and Other (See Comments)    Tolerated cephalosporins in past Has patient had a PCN reaction causing immediate rash, facial/tongue/throat swelling, SOB or lightheadedness with hypotension: Yes Has patient had a PCN reaction causing severe rash involving mucus membranes  or skin necrosis: No Has patient had a PCN reaction that required hospitalization: No Has patient had a PCN reaction occurring within the last 10 years: No If all of the above answers are "NO", then may proceed with Cephalosporin use.      Medications  Current Outpatient Medications:    albuterol (PROVENTIL) (2.5 MG/3ML) 0.083% nebulizer solution, Take 2.5 mg by nebulization every 6 (six) hours as needed for wheezing or shortness of breath., Disp: , Rfl:    allopurinol (ZYLOPRIM) 100 MG tablet, Take 1 tablet (100 mg total) by mouth daily with breakfast., Disp: 90 tablet, Rfl: 0   alum & mag hydroxide-simeth (MAALOX MAX) 400-400-40 MG/5ML suspension, Take 15 mLs by mouth every 6 (six) hours as needed for indigestion., Disp: 355 mL, Rfl: 0   amLODipine (NORVASC) 2.5 MG tablet, Take 1/2 tablet as needed for BP greater than 160., Disp: 30 tablet, Rfl: 1   ARIPiprazole (ABILIFY) 2 MG tablet, TAKE 1 TABLET BY MOUTH ONCE DAILY, Disp: 90 tablet, Rfl: 0   aspirin EC 81 MG tablet, Take 81 mg by mouth daily with breakfast. Swallow whole., Disp: , Rfl:    atorvastatin (LIPITOR) 20 MG tablet, Take 1 tablet (20 mg total) by mouth at bedtime for 90 doses., Disp: 90 tablet, Rfl: 0   cholecalciferol (VITAMIN D3) 25 MCG (1000 UNIT) tablet, Take 1 tablet (1,000 Units total) by mouth daily with supper., Disp: 90 tablet, Rfl: 1   citalopram (CELEXA) 10 MG tablet, Take 1 tablet (10 mg total) by mouth daily., Disp: 90 tablet, Rfl: 0   cyclobenzaprine (FLEXERIL) 10 MG tablet, Take 1 tablet (10 mg total) by mouth 3 (three) times daily., Disp: 270 tablet, Rfl: 0   D3-1000 25 MCG (1000 UT) capsule, TAKE 1 CAPSULE BY MOUTH DAILY WITH SUPPER., Disp: 90 capsule, Rfl: 0   dapagliflozin propanediol (FARXIGA) 10 MG TABS tablet, Take 1  tablet (10 mg total) by mouth daily before breakfast., Disp: 90 tablet, Rfl: 2   Docusate Sodium (DSS) 100 MG CAPS, Take 100 mg by mouth daily as needed (constipation)., Disp: , Rfl:     donepezil (ARICEPT) 10 MG tablet, Take 1 tablet (10 mg total) by mouth at bedtime., Disp: 30 tablet, Rfl: 0   feeding supplement (ENSURE ENLIVE / ENSURE PLUS) LIQD, Take 237 mLs by mouth 2 (two) times daily between meals., Disp: , Rfl:    ferrous sulfate 325 (65 FE) MG tablet, Take 1 tablet (325 mg total) by mouth 2 (two) times daily for 180 doses., Disp: 180 tablet, Rfl: 0   Fluticasone-Umeclidin-Vilant (TRELEGY ELLIPTA) 200-62.5-25 MCG/ACT AEPB, Inhale 1 puff into the lungs daily., Disp: 1 each, Rfl: 5   furosemide (LASIX) 80 MG tablet, TAKE 1 TABLET BY MOUTH DAILY., Disp: 90 tablet, Rfl: 3   gabapentin (NEURONTIN) 100 MG capsule, TAKE 2 CAPSULES BY MOUTH TWICE DAILY (Patient taking differently: Take 100 mg by mouth 2 (two) times daily. Take 1 capsule by mouth in the Morning, and 2 at Night), Disp: 360 capsule, Rfl: 2   Ipratropium-Albuterol (COMBIVENT RESPIMAT) 20-100 MCG/ACT AERS respimat, Inhale 1 puff into the lungs 3 (three) times daily as needed for wheezing or shortness of breath., Disp: , Rfl:    levothyroxine (SYNTHROID) 50 MCG tablet, TAKE 1 TABLET BY MOUTH DAILY BEFORE BREAKFAST., Disp: 90 tablet, Rfl: 0   Magnesium Oxide -Mg Supplement 500 MG CAPS, TAKE 1 CAPSULE BY MOUTH DAILY, Disp: 90 capsule, Rfl: 0   Magnesium Oxide -Mg Supplement 500 MG CAPS, Take 1 capsule (500 mg total) by mouth daily., Disp: 90 capsule, Rfl: 0   Melatonin 10 MG TABS, Take 10 mg by mouth at bedtime., Disp: , Rfl:    metoprolol tartrate (LOPRESSOR) 25 MG tablet, TAKE 1/2 TABLET BY MOUTH TWICE DAILY, Disp: 15 tablet, Rfl: 0   Multiple Vitamin (MULTIVITAMIN WITH MINERALS) TABS tablet, Take 1 tablet by mouth daily., Disp: , Rfl:    nitroGLYCERIN (NITROSTAT) 0.4 MG SL tablet, Place 1 tablet (0.4 mg total) under the tongue every 5 (five) minutes as needed for chest pain. For chest pain, Disp: 25 tablet, Rfl: 3   OXYGEN, Inhale 4 L/min into the lungs as needed (DURING ALL TIMES OF EXERTION). , Disp: , Rfl:     pantoprazole (PROTONIX) 40 MG tablet, Take 1 tablet (40 mg total) by mouth 2 (two) times daily., Disp: 60 tablet, Rfl: 0   pilocarpine (PILOCAR) 1 % ophthalmic solution, Place 1 drop into both eyes 2 (two) times daily., Disp: , Rfl:    polyethylene glycol (MIRALAX / GLYCOLAX) 17 g packet, Take 17 g by mouth as needed., Disp: , Rfl:    potassium chloride SA (KLOR-CON M) 20 MEQ tablet, TAKE 1 TABLET ONCE DAILY WITH FOOD, Disp: 90 tablet, Rfl: 0   pramipexole (MIRAPEX) 0.25 MG tablet, TAKE 1 TABLET 2-3 HOURS BEFORE BEDTIME, Disp: 90 tablet, Rfl: 0   roflumilast (DALIRESP) 500 MCG TABS tablet, TAKE 1 TABLET ONCE DAILY, Disp: 90 tablet, Rfl: 0   spironolactone (ALDACTONE) 25 MG tablet, TAKE 1 TABLET BY MOUTH DAILY., Disp: 90 tablet, Rfl: 1   traZODone (DESYREL) 50 MG tablet, TAKE 3 TABLETS EVERY NIGHT AT BEDTIME., Disp: 270 tablet, Rfl: 0   vitamin C (ASCORBIC ACID) 500 MG tablet, Take 500 mg by mouth daily with lunch., Disp: , Rfl:    XTAMPZA ER 9 MG C12A, Take 9 mg by mouth in the morning, at noon,  and at bedtime., Disp: 90 capsule, Rfl: 0   Review of Systems Review of Systems  Constitutional:  Negative for chills and fever.  Eyes:  Positive for double vision. Negative for blurred vision.  Respiratory:  Positive for shortness of breath and wheezing. Negative for cough and sputum production.   Cardiovascular:  Negative for chest pain, palpitations and leg swelling.  Gastrointestinal:  Negative for abdominal pain, constipation, diarrhea, nausea and vomiting.  Genitourinary:  Negative for frequency.  Musculoskeletal:  Negative for myalgias.  Skin:  Negative for itching and rash.  Neurological:  Negative for dizziness, weakness and headaches.  Endo/Heme/Allergies:  Negative for polydipsia.  Psychiatric/Behavioral:  Negative for depression.        Objective:    Vitals BP 138/88   Pulse (!) 110   Temp 98.6 F (37 C)   Resp 20   Ht 5\' 7"  (1.702 m)   Wt 167 lb 9.6 oz (76 kg)   SpO2 93%    BMI 26.25 kg/m    Physical Examination Physical Exam Constitutional:      Appearance: Normal appearance. She is not ill-appearing.  HENT:     Right Ear: Tympanic membrane and external ear normal.     Left Ear: Tympanic membrane, ear canal and external ear normal.     Ears:     Comments: She has an abrasion to the inside of her right canal with bleeding.  Her TM was normal.    Nose: Nose normal. No congestion or rhinorrhea.     Mouth/Throat:     Mouth: Mucous membranes are moist.     Pharynx: Oropharynx is clear. No oropharyngeal exudate or posterior oropharyngeal erythema.  Cardiovascular:     Rate and Rhythm: Normal rate and regular rhythm.     Pulses: Normal pulses.     Heart sounds: No murmur heard.    No friction rub. No gallop.  Pulmonary:     Effort: Pulmonary effort is normal. No respiratory distress.     Breath sounds: No wheezing, rhonchi or rales.  Abdominal:     General: Bowel sounds are normal. There is no distension.     Palpations: Abdomen is soft.     Tenderness: There is no abdominal tenderness.  Musculoskeletal:     Right lower leg: No edema.     Left lower leg: No edema.  Skin:    General: Skin is warm and dry.     Findings: No rash.  Neurological:     General: No focal deficit present.     Mental Status: She is alert and oriented to person, place, and time.  Psychiatric:        Mood and Affect: Mood normal.        Behavior: Behavior normal.        Assessment & Plan:   Chronic diastolic heart failure (HCC) She has no signs of volume overload.  We will continue on her lasix and other medications at this time.  COPD exacerbation (HCC) She is having a COPD flare.  We are going to arrange for her to get portable oxygen. She has mild wheezing today and her oxygen sats are not bad.  We will give her doxycycline and a medrol dose pak.  She can use her neb machine as needed.  She wants another referral back to pulmonary.  Age-related osteoporosis without  current pathological fracture She is interested in taking prolia again.  WE will check a CMP.  Memory loss I am going to start  her on namenda for her memory.  WE will see her back in 3 months.    Return in about 3 months (around 03/01/2024).   Crist Fat, MD

## 2023-12-02 NOTE — Assessment & Plan Note (Signed)
She is interested in taking prolia again.  WE will check a CMP.

## 2023-12-02 NOTE — Assessment & Plan Note (Signed)
She is having a COPD flare.  We are going to arrange for her to get portable oxygen. She has mild wheezing today and her oxygen sats are not bad.  We will give her doxycycline and a medrol dose pak.  She can use her neb machine as needed.  She wants another referral back to pulmonary.

## 2023-12-03 LAB — CMP14 + ANION GAP
ALT: 6 [IU]/L (ref 0–32)
AST: 14 [IU]/L (ref 0–40)
Albumin: 4.2 g/dL (ref 3.7–4.7)
Alkaline Phosphatase: 109 [IU]/L (ref 44–121)
Anion Gap: 13 mmol/L (ref 10.0–18.0)
BUN/Creatinine Ratio: 17 (ref 12–28)
BUN: 20 mg/dL (ref 8–27)
Bilirubin Total: 0.6 mg/dL (ref 0.0–1.2)
CO2: 29 mmol/L (ref 20–29)
Calcium: 9.6 mg/dL (ref 8.7–10.3)
Chloride: 103 mmol/L (ref 96–106)
Creatinine, Ser: 1.16 mg/dL — ABNORMAL HIGH (ref 0.57–1.00)
Globulin, Total: 2.7 g/dL (ref 1.5–4.5)
Glucose: 89 mg/dL (ref 70–99)
Potassium: 5.3 mmol/L — ABNORMAL HIGH (ref 3.5–5.2)
Sodium: 145 mmol/L — ABNORMAL HIGH (ref 134–144)
Total Protein: 6.9 g/dL (ref 6.0–8.5)
eGFR: 47 mL/min/{1.73_m2} — ABNORMAL LOW (ref >59)

## 2023-12-07 ENCOUNTER — Other Ambulatory Visit: Payer: Self-pay | Admitting: Internal Medicine

## 2023-12-07 DIAGNOSIS — I471 Supraventricular tachycardia, unspecified: Secondary | ICD-10-CM

## 2023-12-08 NOTE — Progress Notes (Signed)
Set her up for prolia within 1 month of her labs.  Sent to [facility]

## 2023-12-09 ENCOUNTER — Other Ambulatory Visit: Payer: Self-pay | Admitting: Internal Medicine

## 2023-12-09 MED ORDER — XTAMPZA ER 9 MG PO C12A: 9.0000 mg | EXTENDED_RELEASE_CAPSULE | Freq: Three times a day (TID) | ORAL | 0 refills | Status: DC

## 2023-12-21 ENCOUNTER — Other Ambulatory Visit: Payer: Self-pay

## 2023-12-21 MED ORDER — TRELEGY ELLIPTA 200-62.5-25 MCG/ACT IN AEPB: 1.0000 | INHALATION_SPRAY | Freq: Every day | RESPIRATORY_TRACT | 5 refills | Status: DC

## 2023-12-24 ENCOUNTER — Other Ambulatory Visit: Payer: Self-pay | Admitting: Internal Medicine

## 2023-12-28 DIAGNOSIS — M810 Age-related osteoporosis without current pathological fracture: Secondary | ICD-10-CM | POA: Diagnosis not present

## 2024-01-08 ENCOUNTER — Other Ambulatory Visit: Payer: Self-pay | Admitting: Internal Medicine

## 2024-01-12 ENCOUNTER — Other Ambulatory Visit: Payer: Self-pay | Admitting: Internal Medicine

## 2024-01-12 MED ORDER — XTAMPZA ER 9 MG PO C12A: 9.0000 mg | EXTENDED_RELEASE_CAPSULE | Freq: Three times a day (TID) | ORAL | 0 refills | Status: DC

## 2024-01-14 ENCOUNTER — Other Ambulatory Visit: Payer: Self-pay | Admitting: Internal Medicine

## 2024-01-15 ENCOUNTER — Ambulatory Visit: Payer: Medicare HMO | Admitting: Internal Medicine

## 2024-01-15 ENCOUNTER — Encounter: Payer: Self-pay | Admitting: Internal Medicine

## 2024-01-15 VITALS — BP 144/76 | HR 108 | Temp 98.0°F | Resp 20 | Ht 67.0 in | Wt 167.4 lb

## 2024-01-15 DIAGNOSIS — I471 Supraventricular tachycardia, unspecified: Secondary | ICD-10-CM

## 2024-01-15 DIAGNOSIS — I5032 Chronic diastolic (congestive) heart failure: Secondary | ICD-10-CM | POA: Diagnosis not present

## 2024-01-15 MED ORDER — METOPROLOL TARTRATE 25 MG PO TABS: 12.5000 mg | ORAL_TABLET | Freq: Two times a day (BID) | ORAL | 0 refills | Status: DC

## 2024-01-15 NOTE — Assessment & Plan Note (Addendum)
She is having some acute on chronic diastolic CHF.  I am going to obtain a CBC and CMP on her to look at her anemia and her kidney function.  I want her to take lasix 80mg  BID for the next 3 days at this time.  I also want them to use her compression socks as well.

## 2024-01-15 NOTE — Progress Notes (Signed)
Office Visit  Subjective   Patient ID: [name]   DOB: 08-17-1941   Age: 83 y.o.   MRN: [id number]   Chief Complaint Chief Complaint  Patient presents with   Leg Swelling    X 3 days, legs are swollen and very warm to touch and they hurt to touch. Has been giving her Lasix 80 mg every day.     History of Present Illness [name] is a 83 yo female who comes in today with complaints of swelling of her bilateral lower legs that began on Wednesday.  She noticed redness of her legs from her feet up to almost her knees with increased edema that is 2+.  She usually weighs 155 lbs but recently she has weighing 165 lbs.  She does weight herself daily.  She is on lasix 80mg  every other day and spironolactone 25mg  daily but she went up on her dose of lasix on Wednesday 3 days ago to lasix 80mg  daily.  She denies any chest pain and no heart palpitations at rest and she has had some worsening SOB.  [name] has a history of chronic diastolic congestive heart failure.  She did see cardiology on 07/2021 where they wanted to start her on farxiga.  Her last visit with cardiology was in 02/2023 and they felt she was stable.  She was previously on Lasix 80mg  daily but when she saw cardiology they recommended to take this as needed.  She has been taking Lasix 80mg  every other day.  She is also on spironolactone 25mg  which she does take daily.  Again, she has had multiple admissions to the hospital in the past for acute on chronic respiratory failure due to her COPD and diastolic CHF. She is not currently on fluid restriction.  She continues to do daily weights where we believe her dry weight is around 167-168 lb however this past year she has been weighing in our office 153-155 lbs.   The patient does have a history of Stage III CKD and a history of cirrhosis and CAD.  Her last ECHO was done on 06/06/2022 and this showed a LVEF 60-65% with normal function and without regional wall motion abnormalities.   There was mild LVH and her LV diastolic parameters were indeterminate.  Her RV systolic function was normal.  Her bilateral atrium were moderately dilated.  There was mild MV regurgitaiton.  There was severe mitral annular calcification.  She had mild AV regurgitation.  She had a previous ECHO in 10/09/2020 and this showed a normal LVEF of 60-65% with diastolic dysfunction.  She had no regional wall motion abnormalites however she had moderate concentric LVH.  She had moderate bilateral atrial enlargement.  Today, there is no chest pain, no worsening SOB, palpitations but she does have some peripheral edema.  On her last visit, she was noted to have some lower extremity edema.  I asked her to increase her lasix to 80mg  daily x 5 days then go back to lasix 80mg  every other day.  This did help with her edema and she is not having any problems today.      Past Medical History Past Medical History:  Diagnosis Date   Anemia    bld. transfusion post lumbar surgery- 2012   Anxiety    Arthralgia    NOS   CAD (coronary artery disease)    s/p CABG 2004; s/p DES to LM in 2010;  LHC 10/29/11: EF 50-55%, mild elevated filling pressures, no pulmonary  HTN, LM 90% ISR, LAD and CFX occluded, S-RI occluded (old), S-OM3 ok and L-LAD ok, native nondominant RCA 95% -  med rx recommended ; Lexiscan Myoview 7/13 at [facility]: demonstrated "normal LV function, anterior attenuation and localized ischemia, inferior, basilar, mid section"   Carotid artery disease (HCC)    Carotid US 6/19:  RICA 1-39; LICA 40-59; R subclavian stenosis - Repeat 1 year. // Carotid US 05/2019: R 1-39; L 40-59; R vertebral with atypical antegrade flow; R subclavian stenosis >> repeat 1 year // Carotid US 7/21: Bilat 40-59; L subclavian stenosis // Carotid US 7/22: Bilat ICA 40-59; R subclavian stenosis // Carotid US 08/10/23: RICA 1-39, LICA 40-59, L subclavian stenosis   CHF (congestive heart failure) (HCC) 01/21/2020   Chronic diastolic heart  failure (HCC)    Echo 9/10: EF 60-55%, grade 1 diastolic dysfunction   Chronic lower back pain    Chronic respiratory failure (HCC)    Cirrhosis (HCC)    CKD (chronic kidney disease), stage III (HCC)    COPD (chronic obstructive pulmonary disease) (HCC)    Emphysema dxed by Dr. Sherril Croon in [place] based on PFTs per pt in 2006; placed on albuterol   Degenerative disc disease, lumbar    Depression    TAKES CELEXA  AND  (OFF- WELBUTRIN)   Diuretic-induced hypokalemia 10/08/2018   DVT of lower extremity (deep venous thrombosis) (HCC)    recurrent. bilateral (2 episodes)   Dyspnea    home o2 when needed   Dysrhythmia    afib with cabg   Exertional angina (HCC)    Treated with Isosorbide, Ranexa, amlodipine; intolerant to metoprolol   GERD (gastroesophageal reflux disease)    Gout    "on daily RX" (10/06/2018)   HLD (hyperlipidemia)    Hyperlipidemia    Hypertension    Hypothyroidism    L Subclavian Artery Stenosis    Carotid US 08/10/23: RICA 1-39, LICA 40-59, L subclavian stenosis   Obesity (BMI 30-39.9) 2009   BMI 33   On home oxygen therapy    OSA (obstructive sleep apnea)    Osteoarthritis    "all over; hands" (10/06/2018)   Osteoporosis    Oxygen dependent    4L at home as needed (10/06/2018)   PVD (peripheral vascular disease) (HCC)    Restless leg syndrome    Vitamin D deficiency      Allergies Allergies  Allergen Reactions   Ativan [Lorazepam] Other (See Comments)    Confusion    Pitavastatin Itching and Other (See Comments)    Reaction to Livalo   Ropinirole Nausea Only and Other (See Comments)    Makes legs jump, also   Zofran [Ondansetron Hcl] Nausea Only   Zolpidem Tartrate Anxiety and Other (See Comments)    CONFUSION    Zolpidem Tartrate Swelling    CONFUSION   Penicillins Rash and Other (See Comments)    Tolerated cephalosporins in past Has patient had a PCN reaction causing immediate rash, facial/tongue/throat swelling, SOB or lightheadedness with  hypotension: Yes Has patient had a PCN reaction causing severe rash involving mucus membranes or skin necrosis: No Has patient had a PCN reaction that required hospitalization: No Has patient had a PCN reaction occurring within the last 10 years: No If all of the above answers are "NO", then may proceed with Cephalosporin use.      Medications  Current Outpatient Medications:    albuterol (PROVENTIL) (2.5 MG/3ML) 0.083% nebulizer solution, Take 2.5 mg by nebulization every 6 (six)  hours as needed for wheezing or shortness of breath., Disp: , Rfl:    allopurinol (ZYLOPRIM) 100 MG tablet, Take 1 tablet (100 mg total) by mouth daily with breakfast., Disp: 90 tablet, Rfl: 0   alum & mag hydroxide-simeth (MAALOX MAX) 400-400-40 MG/5ML suspension, Take 15 mLs by mouth every 6 (six) hours as needed for indigestion., Disp: 355 mL, Rfl: 0   amLODipine (NORVASC) 2.5 MG tablet, Take 1/2 tablet as needed for BP greater than 160., Disp: 30 tablet, Rfl: 1   ARIPiprazole (ABILIFY) 2 MG tablet, TAKE 1 TABLET BY MOUTH ONCE DAILY, Disp: 90 tablet, Rfl: 0   aspirin EC 81 MG tablet, Take 81 mg by mouth daily with breakfast. Swallow whole., Disp: , Rfl:    atorvastatin (LIPITOR) 20 MG tablet, Take 1 tablet (20 mg total) by mouth at bedtime for 90 doses., Disp: 90 tablet, Rfl: 0   cholecalciferol (VITAMIN D3) 25 MCG (1000 UNIT) tablet, Take 1 tablet (1,000 Units total) by mouth daily with supper., Disp: 90 tablet, Rfl: 1   citalopram (CELEXA) 10 MG tablet, Take 1 tablet (10 mg total) by mouth daily., Disp: 90 tablet, Rfl: 0   cyclobenzaprine (FLEXERIL) 10 MG tablet, Take 1 tablet (10 mg total) by mouth 3 (three) times daily., Disp: 270 tablet, Rfl: 0   D3-1000 25 MCG (1000 UT) capsule, TAKE 1 CAPSULE BY MOUTH DAILY WITH SUPPER., Disp: 90 capsule, Rfl: 0   dapagliflozin propanediol (FARXIGA) 10 MG TABS tablet, Take 1 tablet (10 mg total) by mouth daily before breakfast., Disp: 90 tablet, Rfl: 2   Docusate Sodium  (DSS) 100 MG CAPS, Take 100 mg by mouth daily as needed (constipation)., Disp: , Rfl:    donepezil (ARICEPT) 10 MG tablet, TAKE 1 TABLET BY MOUTH AT BEDTIME., Disp: 30 tablet, Rfl: 0   doxycycline (MONODOX) 100 MG capsule, Take 1 capsule (100 mg total) by mouth 2 (two) times daily., Disp: 20 capsule, Rfl: 0   feeding supplement (ENSURE ENLIVE / ENSURE PLUS) LIQD, Take 237 mLs by mouth 2 (two) times daily between meals., Disp: , Rfl:    ferrous sulfate 325 (65 FE) MG tablet, Take 1 tablet (325 mg total) by mouth 2 (two) times daily for 180 doses., Disp: 180 tablet, Rfl: 0   Fluticasone-Umeclidin-Vilant (TRELEGY ELLIPTA) 200-62.5-25 MCG/ACT AEPB, Inhale 1 puff into the lungs daily., Disp: 1 each, Rfl: 5   furosemide (LASIX) 80 MG tablet, TAKE 1 TABLET BY MOUTH DAILY., Disp: 90 tablet, Rfl: 3   gabapentin (NEURONTIN) 100 MG capsule, TAKE 2 CAPSULES BY MOUTH TWICE DAILY, Disp: 360 capsule, Rfl: 1   Ipratropium-Albuterol (COMBIVENT RESPIMAT) 20-100 MCG/ACT AERS respimat, Inhale 1 puff into the lungs 3 (three) times daily as needed for wheezing or shortness of breath., Disp: , Rfl:    levothyroxine (SYNTHROID) 50 MCG tablet, TAKE 1 TABLET BY MOUTH DAILY BEFORE BREAKFAST., Disp: 90 tablet, Rfl: 0   Magnesium Oxide -Mg Supplement 500 MG CAPS, TAKE 1 CAPSULE BY MOUTH DAILY, Disp: 90 capsule, Rfl: 0   Magnesium Oxide -Mg Supplement 500 MG CAPS, Take 1 capsule (500 mg total) by mouth daily., Disp: 90 capsule, Rfl: 0   Melatonin 10 MG TABS, Take 10 mg by mouth at bedtime., Disp: , Rfl:    memantine (NAMENDA) 5 MG tablet, Take 1 tablet (5 mg total) by mouth 2 (two) times daily., Disp: 60 tablet, Rfl: 2   metoprolol tartrate (LOPRESSOR) 25 MG tablet, Take 0.5 tablets (12.5 mg total) by mouth 2 (two) times daily.,  Disp: 15 tablet, Rfl: 0   Multiple Vitamin (MULTIVITAMIN WITH MINERALS) TABS tablet, Take 1 tablet by mouth daily., Disp: , Rfl:    nitroGLYCERIN (NITROSTAT) 0.4 MG SL tablet, Place 1 tablet (0.4 mg  total) under the tongue every 5 (five) minutes as needed for chest pain. For chest pain, Disp: 25 tablet, Rfl: 3   OXYGEN, Inhale 4 L/min into the lungs as needed (DURING ALL TIMES OF EXERTION). , Disp: , Rfl:    pantoprazole (PROTONIX) 40 MG tablet, Take 1 tablet (40 mg total) by mouth 2 (two) times daily., Disp: 60 tablet, Rfl: 0   pilocarpine (PILOCAR) 1 % ophthalmic solution, Place 1 drop into both eyes 2 (two) times daily., Disp: , Rfl:    polyethylene glycol (MIRALAX / GLYCOLAX) 17 g packet, Take 17 g by mouth as needed., Disp: , Rfl:    potassium chloride SA (KLOR-CON M) 20 MEQ tablet, TAKE 1 TABLET ONCE DAILY WITH FOOD, Disp: 90 tablet, Rfl: 0   pramipexole (MIRAPEX) 0.25 MG tablet, TAKE 1 TABLET 2-3 HOURS BEFORE BEDTIME, Disp: 90 tablet, Rfl: 0   roflumilast (DALIRESP) 500 MCG TABS tablet, TAKE 1 TABLET ONCE DAILY, Disp: 90 tablet, Rfl: 0   spironolactone (ALDACTONE) 25 MG tablet, TAKE 1 TABLET BY MOUTH DAILY., Disp: 90 tablet, Rfl: 1   traZODone (DESYREL) 50 MG tablet, TAKE 3 TABLETS EVERY NIGHT AT BEDTIME., Disp: 270 tablet, Rfl: 0   vitamin C (ASCORBIC ACID) 500 MG tablet, Take 500 mg by mouth daily with lunch., Disp: , Rfl:    XTAMPZA ER 9 MG C12A, Take 9 mg by mouth in the morning, at noon, and at bedtime., Disp: 90 capsule, Rfl: 0   Review of Systems Review of Systems  Constitutional:  Negative for chills and fever.  Eyes:  Negative for blurred vision and double vision.  Respiratory:  Positive for shortness of breath and wheezing. Negative for cough.   Cardiovascular:  Positive for leg swelling. Negative for chest pain and palpitations.  Gastrointestinal:  Negative for abdominal pain, constipation, diarrhea, nausea and vomiting.  Neurological:  Negative for dizziness, weakness and headaches.       Objective:    Vitals BP (!) 144/76 (BP Location: Left Arm, Patient Position: Sitting)   Pulse (!) 108   Temp 98 F (36.7 C) (Temporal)   Resp 20   Ht 5\' 7"  (1.702 m)   Wt 167  lb 6 oz (75.9 kg)   SpO2 95%   BMI 26.21 kg/m    Physical Examination Physical Exam Constitutional:      Appearance: Normal appearance. She is not ill-appearing.  Cardiovascular:     Rate and Rhythm: Normal rate and regular rhythm.     Pulses: Normal pulses.     Heart sounds: No murmur heard.    No friction rub. No gallop.  Pulmonary:     Effort: Pulmonary effort is normal. No respiratory distress.     Breath sounds: No wheezing, rhonchi or rales.  Abdominal:     General: Bowel sounds are normal. There is no distension.     Palpations: Abdomen is soft.     Tenderness: There is no abdominal tenderness.  Musculoskeletal:     Right lower leg: Edema present.     Left lower leg: Edema present.     Comments: 2+ edema in bilateral legs with associated erythema from her feet up to her knees bilaterally.  Skin:    General: Skin is warm and dry.     Findings:  No rash.  Neurological:     Mental Status: She is alert.        Assessment & Plan:   Chronic diastolic (congestive) heart failure (HCC) She is having some acute on chronic diastolic CHF.  I am going to obtain a CBC and CMP on her to look at her anemia and her kidney function.  I want her to take lasix 80mg  BID for the next 3 days at this time.  I also want them to use her compression socks as well.    No follow-ups on file.   Crist Fat, MD

## 2024-01-18 LAB — CBC WITH DIFFERENTIAL/PLATELET
Basophils Absolute: 0.1 10*3/uL (ref 0.0–0.2)
Basos: 1 %
EOS (ABSOLUTE): 0.2 10*3/uL (ref 0.0–0.4)
Eos: 2 %
Hematocrit: 41.9 % (ref 34.0–46.6)
Hemoglobin: 13.9 g/dL (ref 11.1–15.9)
Immature Grans (Abs): 0.1 10*3/uL (ref 0.0–0.1)
Immature Granulocytes: 1 %
Lymphocytes Absolute: 1.5 10*3/uL (ref 0.7–3.1)
Lymphs: 18 %
MCH: 29.4 pg (ref 26.6–33.0)
MCHC: 33.2 g/dL (ref 31.5–35.7)
MCV: 89 fL (ref 79–97)
Monocytes Absolute: 0.8 10*3/uL (ref 0.1–0.9)
Monocytes: 10 %
Neutrophils Absolute: 5.9 10*3/uL (ref 1.4–7.0)
Neutrophils: 68 %
Platelets: 233 10*3/uL (ref 150–450)
RBC: 4.72 x10E6/uL (ref 3.77–5.28)
RDW: 14.9 % (ref 11.7–15.4)
WBC: 8.5 10*3/uL (ref 3.4–10.8)

## 2024-01-18 LAB — CMP14 + ANION GAP
ALT: 6 [IU]/L (ref 0–32)
AST: 9 [IU]/L (ref 0–40)
Albumin: 4 g/dL (ref 3.7–4.7)
Alkaline Phosphatase: 107 [IU]/L (ref 44–121)
Anion Gap: 18 mmol/L (ref 10.0–18.0)
BUN/Creatinine Ratio: 13 (ref 12–28)
BUN: 15 mg/dL (ref 8–27)
Bilirubin Total: 0.5 mg/dL (ref 0.0–1.2)
CO2: 23 mmol/L (ref 20–29)
Calcium: 8.8 mg/dL (ref 8.7–10.3)
Chloride: 100 mmol/L (ref 96–106)
Creatinine, Ser: 1.12 mg/dL — ABNORMAL HIGH (ref 0.57–1.00)
Globulin, Total: 2.6 g/dL (ref 1.5–4.5)
Glucose: 134 mg/dL — ABNORMAL HIGH (ref 70–99)
Potassium: 4.2 mmol/L (ref 3.5–5.2)
Sodium: 141 mmol/L (ref 134–144)
Total Protein: 6.6 g/dL (ref 6.0–8.5)
eGFR: 49 mL/min/{1.73_m2} — ABNORMAL LOW (ref >59)

## 2024-01-18 LAB — BRAIN NATRIURETIC PEPTIDE

## 2024-01-28 DIAGNOSIS — F0390 Unspecified dementia without behavioral disturbance: Secondary | ICD-10-CM | POA: Diagnosis not present

## 2024-01-28 DIAGNOSIS — R0602 Shortness of breath: Secondary | ICD-10-CM | POA: Diagnosis not present

## 2024-01-28 DIAGNOSIS — R0902 Hypoxemia: Secondary | ICD-10-CM | POA: Diagnosis not present

## 2024-01-28 DIAGNOSIS — N183 Chronic kidney disease, stage 3 unspecified: Secondary | ICD-10-CM | POA: Diagnosis not present

## 2024-01-28 DIAGNOSIS — J449 Chronic obstructive pulmonary disease, unspecified: Secondary | ICD-10-CM | POA: Diagnosis not present

## 2024-01-28 DIAGNOSIS — R531 Weakness: Secondary | ICD-10-CM | POA: Diagnosis not present

## 2024-01-28 DIAGNOSIS — R509 Fever, unspecified: Secondary | ICD-10-CM | POA: Diagnosis not present

## 2024-01-28 DIAGNOSIS — Z66 Do not resuscitate: Secondary | ICD-10-CM | POA: Diagnosis not present

## 2024-01-28 DIAGNOSIS — Z20822 Contact with and (suspected) exposure to covid-19: Secondary | ICD-10-CM | POA: Diagnosis not present

## 2024-01-28 DIAGNOSIS — I5030 Unspecified diastolic (congestive) heart failure: Secondary | ICD-10-CM | POA: Diagnosis not present

## 2024-01-28 DIAGNOSIS — I10 Essential (primary) hypertension: Secondary | ICD-10-CM | POA: Diagnosis not present

## 2024-01-28 DIAGNOSIS — R000 Tachycardia, unspecified: Secondary | ICD-10-CM | POA: Diagnosis not present

## 2024-01-28 DIAGNOSIS — J101 Influenza due to other identified influenza virus with other respiratory manifestations: Secondary | ICD-10-CM | POA: Diagnosis not present

## 2024-01-28 DIAGNOSIS — I5032 Chronic diastolic (congestive) heart failure: Secondary | ICD-10-CM | POA: Diagnosis not present

## 2024-01-28 DIAGNOSIS — I130 Hypertensive heart and chronic kidney disease with heart failure and stage 1 through stage 4 chronic kidney disease, or unspecified chronic kidney disease: Secondary | ICD-10-CM | POA: Diagnosis not present

## 2024-01-28 DIAGNOSIS — R059 Cough, unspecified: Secondary | ICD-10-CM | POA: Diagnosis not present

## 2024-01-28 NOTE — ED Provider Notes (Signed)
 [facility] eMERGENCY dEPARTMENT eNCOUnter     CLINICAL IMPRESSION Final diagnoses:  Influenza A (Primary)  Tachycardia    ASSESSMENT & PLAN She is influenza A pos by nasal swab.  No pneumonia on CXR.  Elevated BUN with Cr 1.2 with baseline around 1.1.  She remains tachcardic in 110.  ECG shows ST with occasional PVC.  RBBB.  No acute ST/T changes.  Temp 101.2.  Will treat flu with tamiflu.  Received APAP in ED already.  Duoneb for now and will reassess air movement.  Based on her PCP note from 01/15/24, she may have increased her lasix  and now is a bit dry with slight bump in BUN/Cr.  Will give 250cc NS bolus.  Reassess tachycardia after treatment.  Dispo planning based on response to treatment.  8:49 AM I spoken with the patient's daughter, [name], who is her healthcare power of attorney.  She confirms patient does have dementia and is having difficulty remembering things including important elements of her own medical care.  She states she should be on home oxygen  consistently but does not always wear it as directed.  She notes that her primary care provider as directed the patient not to go to [facility] for variety of medical reasons and instead prefers either coming to [place] or to [place].  She also confirms that the patient is DNR/DNI.  The daughter suggests that the patient has probably had a time when she needs to be in full-time care by family members.  Her brother and sister-in-law typically care for the patient, and in fact the patient was staying at their house last night.  If the patient were to go home it would be to the brother's house where there is 24-hour care available.  She states they would be comfortable taking care of the patient at home if home disposition were possible today.  10:56 AM Pt feeling better after fever reduction.  HR in the 90's after NS bolus.  She and daughter are comfortable having her at home.  Tamiflu Rx given.  Return precautions  given.       Independent interpretation: EKG(s) - ST with HR in 110's.  No acute ST/T changes.  RBBB  Escalation of Care including OBS/Admission/Transfer was considered: However, patient was determined to be appropriate for outpatient management. See progress note for additional detail. Social Determinants that significantly affected care: moderate dementia.  Likely needs full time observation, which family will provide       Portions of this record have been created using Scientist, clinical (histocompatibility and immunogenetics). Dictation errors have been sought, but may not have been identified and corrected.      _______________________________________________________________  Time seen: January 28, 2024 8:10 AM  I have reviewed the triage vital signs and the nursing notes.  CHIEF COMPLAINT   Chief Complaint  Patient presents with  . Shortness of Breath    HPI  [name] is a 83 y.o. female with past medical history of COPD on intermittent oxygen , diastolic CHF with last echo of June 2023, stage III CKD, cirrhosis, CAD status post CABG in 2004 with stent placement in 2010, carotid artery disease, depression, history of A-fib, angina, GERD, gout, hyperlipidemia, hypertension, hypothyroidism, OSA, osteoporosis, RLS who presents with via EMS for not feeling well for the last couple of days.  EMS reported that she was not on any oxygen  at home and was satting about 86%.  They also state that her initial heart rate was in the 50s but was  110 by the time she arrived here.  Patient lives alone but has her granddaughter help with her care including laying out her medications.  When asked the patient is not able to tell me which medicines she takes but that she does take them as they are given to her by her granddaughter.  The patient has difficulty responding to open-ended questions such as why do her oxygen  or what symptoms have you been having.  She can only tell me that she feels ill and has for the last  several days and that she has had cough for last for 5 days.   PAST MEDICAL HISTORY   Past Medical History:  Diagnosis Date  . COPD (chronic obstructive pulmonary disease) (CMS-HCC)     There is no problem list on file for this patient.   SURGICAL HISTORY   No past surgical history on file.  CURRENT MEDICATIONS    Current Facility-Administered Medications:  .  ipratropium-albuterol  (DUO-NEB) 0.5-2.5 mg/3 mL nebulizer solution 3 mL, 3 mL, Nebulization, Once, Veria, Arley Dickens, MD  Current Outpatient Medications:  .  albuterol  2.5 mg /3 mL (0.083 %) nebulizer solution, Inhale 3 mL (2.5 mg total) by nebulization every six (6) hours as needed for wheezing., Disp: , Rfl:  .  aluminum-magnesium  hydroxide (MAALOX) 200-200 mg/5 mL suspension, Take by mouth every six (6) hours as needed for indigestion., Disp: , Rfl:  .  dapagliflozin  propanediol (FARXIGA ) 10 mg Tab tablet, Take by mouth every morning., Disp: , Rfl:  .  ipratropium-albuterol  (COMBIVENT  RESPIMAT) 20-100 mcg/actuation Mist inhaler, Inhale 1 puff., Disp: , Rfl:  .  magnesium  oxide 500 mg cap, Take by mouth., Disp: , Rfl:  .  melatonin 10 mg Tab, Take 1 tablet (10 mg total) by mouth., Disp: , Rfl:  .  nitroglycerin  (NITROSTAT ) 0.4 MG SL tablet, Place 1 tablet (0.4 mg total) under the tongue every five (5) minutes as needed for chest pain. Maximum of 3 doses in 15 minutes., Disp: , Rfl:  .  pilocarpine  (PILOCAR) 1 % ophthalmic solution, 1 drop four (4) times a day., Disp: , Rfl:  .  roflumilast  (DALIRESP ) 500 mcg Tab, Take 1 tablet (500 mcg total) by mouth daily., Disp: , Rfl:  .  allopurinol  (ZYLOPRIM ) 100 MG tablet, Take 1 tablet (100 mg total) by mouth daily., Disp: , Rfl:  .  amlodipine  (NORVASC ) 2.5 MG tablet, Take 1 tablet (2.5 mg total) by mouth daily., Disp: , Rfl:  .  aspirin  (ECOTRIN) 81 MG tablet, Take 1 tablet (81 mg total) by mouth daily., Disp: , Rfl:  .  citalopram  (CELEXA ) 10 MG tablet, Take 1 tablet (10 mg total)  by mouth daily., Disp: , Rfl:  .  donepezil  (ARICEPT ) 10 MG tablet, Take 1 tablet (10 mg total) by mouth nightly., Disp: , Rfl:  .  fluticasone -umeclidin-vilanter (TRELEGY ELLIPTA ) 200-62.5-25 mcg DsDv, Inhale., Disp: , Rfl:  .  furosemide  (LASIX ) 80 MG tablet, Take 1 tablet (80 mg total) by mouth two (2) times a day., Disp: , Rfl:  .  gabapentin  (NEURONTIN ) 100 MG capsule, Take 1 capsule (100 mg total) by mouth Three (3) times a day., Disp: , Rfl:  .  levothyroxine  (SYNTHROID ) 50 MCG tablet, Take 1 tablet (50 mcg total) by mouth daily., Disp: , Rfl:  .  memantine  (NAMENDA ) 5 MG tablet, Take 1 tablet (5 mg total) by mouth two (2) times a day., Disp: , Rfl:  .  metoPROLOL  tartrate (LOPRESSOR ) 25 MG tablet, Take 0.5 tablets (  12.5 mg total) by mouth two (2) times a day., Disp: , Rfl:  .  oxyCODONE  (XTAMPZA  ER) 9 mg CSpT 12 hr ER capsule sprinkle, Take by mouth every twelve (12) hours., Disp: , Rfl:  .  pantoprazole  (PROTONIX ) 40 MG tablet, Take 1 tablet (40 mg total) by mouth daily before breakfast., Disp: , Rfl:  .  potassium chloride  (KLOR-CON ) 10 MEQ CR tablet, Take 1 tablet (10 mEq total) by mouth daily., Disp: , Rfl:  .  pramipexole  (MIRAPEX ) 0.125 MG tablet, Take 1 tablet (0.125 mg total) by mouth Three (3) times a day., Disp: , Rfl:  .  spironolactone  (ALDACTONE ) 25 MG tablet, Take 1 tablet (25 mg total) by mouth daily., Disp: , Rfl:  .  traZODone  (DESYREL ) 50 MG tablet, Take 1 tablet (50 mg total) by mouth nightly., Disp: , Rfl:   ALLERGIES   No Known Allergies  FAMILY HISTORY   History reviewed. No pertinent family history.  SOCIAL HISTORY Social History   Socioeconomic History  . Marital status: Married    Spouse name: None  . Number of children: None  . Years of education: None  . Highest education level: None  Tobacco Use  . Smoking status: Never  . Smokeless tobacco: Never  Substance and Sexual Activity  . Alcohol  use: Never  . Drug use: Never   Social Drivers of Health    Food Insecurity: No Food Insecurity (05/12/2023)   Received from [facility]   Hunger Vital Sign   . Worried About Programme Researcher, Broadcasting/film/video in the Last Year: Never true   . Ran Out of Food in the Last Year: Never true  Transportation Needs: No Transportation Needs (05/12/2023)   [facility]   . Lack of Transportation (Medical): No   . Lack of Transportation (Non-Medical): No  Physical Activity: Inactive (04/22/2022)   [facility]   Exercise Vital Sign   . Days of Exercise per Week: 0 days   . Minutes of Exercise per Session: 0 min     PHYSICAL EXAM    VITAL SIGNS:   ED Triage Vitals [01/28/24 0710]  Enc Vitals Group     BP 106/60     Heart Rate 111     SpO2 Pulse      Resp 22     Temp (!) 38.4 C (101.2 F)     Temp Source Temporal     SpO2 91 %     Weight 76.3 kg (168 lb 3.4 oz)     Height      Head Circumference      Peak Flow      Pain Score      Pain Loc      Pain Education      Exclude from Growth Chart     Constitutional: Alert and oriented. Appears thin and unwell.  Occasional wet sounding, non-productive cough.  Some audible expiratory wheeze.  HEENT      Head: Normocephalic and atraumatic.      Ears: normal external ears and auditory canals bilat.  Normal TM bilat.      Eyes: Conjunctivae are normal. EOMI. No scleral icterus.      Nose: mild erythema.      Mouth/Throat: Normal voice. Mucous membranes are dry. Oropharynx normal         Neck: No stridor. Hematologic/Lymphatic/Immunologic: No cervical lymphadenopathy or JVD Cardiovascular: tachycardia, regular rhythm. No murmur.  Good distal peripheral pulses.  No LE edema. Respiratory: coarse breath sounds with marked decreased air movement.  No rales. Gastrointestinal: Soft and nontender. There is no CVA tenderness. Musculoskeletal: Normal range of motion in all extremities. Neurologic: Normal speech and language. No gross focal neurologic deficits are  appreciated. Motor 5/5. Sensation intact.  Poor memory Skin: Skin is warm, dry and intact. No rash noted. Psychiatric: Mood and affect are normal. Speech and behavior are normal.   RADIOLOGY   XR Chest Portable  Final Result  No evidence of pneumonia or pulmonary edema.      LABS Labs Reviewed  INFLUENZA/RSV/COVID PCR - Abnormal; Notable for the following components:      Result Value   Influenza A Positive (*)    All other components within normal limits   Narrative:    The Xpert Xpress CoV-2/Flu/RSV (4plex) plus test is an automated in vitro diagnostic test for the simultaneous qualitative detection and differentiation of RNA from SARS-CoV-2, Flu A, Flu B, and RSV. This assays performance characteristics have been validated by the [facility]. Negative results do not preclude SARS-CoV-2, influenza A virus, influenza B virus and/or RSV infection and should not beused as the sole basis for treatment or other patient management decisions. Negative results must be combined with clinical observations, patient history, and/or epidemiological information.                 This test has not been FDA cleared or approved and has been authorized by FDA under an EUA for use by authorized laboratories. This test has been authorized only for the simultaneous qualitative detection and differentiation of nucleic acids from SARS-CoV-2, influenza A, influenza B, and respiratory syncytial virus (RSV), and not for any other viruses or pathogens. This test is only authorized for the duration of the declaration that circumstances exist justifying the authorization of emergency use of in vitro diagnostic tests for detection and/or diagnosis of COVID-19 under Section 564(b)(1) of the Act, 21 U.S.C. Section 360bbb-3(b)(1), unless the authorization is terminated or revoked sooner.                 Fact Sheet for Providers: seriousbroker.it                 Fact Sheet for  Patients: bloggercourse.com  COMPREHENSIVE METABOLIC PANEL - Abnormal; Notable for the following components:   Chloride 97 (*)    BUN 27 (*)    Creatinine 1.20 (*)    eGFR CKD-EPI (2021) Female 45 (*)    Glucose 126 (*)    Calcium  7.8 (*)    All other components within normal limits  CBC W/ AUTO DIFF - Abnormal; Notable for the following components:   RDW 16.5 (*)    Absolute Neutrophils 7.9 (*)    Absolute Lymphocytes 0.5 (*)    Absolute Monocytes 1.1 (*)    All other components within normal limits  TROPONIN I - Normal  LACTATE SEPSIS - Normal  BLOOD CULTURE  BLOOD CULTURE  CBC W/ DIFFERENTIAL   Narrative:    The following orders were created for panel order CBC w/ Differential.                 Procedure                               Abnormality         Status                                    ---------                               -----------         ------  CBC w/ Differential[[id number]]         Abnormal            Final result                                               Please view results for these tests on the individual orders.  EXTRA TUBES   Narrative:    The following orders were created for panel order Extra Lab Tubes.                 Procedure                               Abnormality         Status                                    ---------                               -----------         ------                                    LIGHT BLUE CITRATE EXTR.SABRASABRA[[id number]]                      Final result                                               Please view results for these tests on the individual orders.  URINALYSIS WITH MICROSCOPY  LIGHT BLUE CITRATE EXTRA TUBE   Narrative:    Collected and received in lab.     ATTESTATIONS      Veria Arley Dickens, MD 01/28/24 1058

## 2024-02-05 NOTE — Progress Notes (Signed)
Her labs look good.  Granddaughter  aware of labs

## 2024-02-09 ENCOUNTER — Encounter: Payer: Medicare HMO | Admitting: Internal Medicine

## 2024-02-09 NOTE — Patient Instructions (Signed)
Continue on TRELEGY 1 puff daily , rinse after use  Decrease Oxgyen 2l/m with activity and At bedtime   Activity as tolerated.  ABG as recommended.  Use extreme caution using sedating medications.  Combivent inhaler As needed  wheezing /shortness of breath .  Follow up with Dr. Chase Caller in 2 months and As needed   Please contact office for sooner follow up if symptoms do not improve or worsen or seek emergency care

## 2024-02-09 NOTE — Progress Notes (Signed)
 This encounter was created in error - please disregard.

## 2024-02-10 ENCOUNTER — Ambulatory Visit: Payer: Medicare HMO | Admitting: Internal Medicine

## 2024-02-17 ENCOUNTER — Other Ambulatory Visit: Payer: Self-pay | Admitting: Internal Medicine

## 2024-02-18 ENCOUNTER — Other Ambulatory Visit: Payer: Self-pay | Admitting: Internal Medicine

## 2024-02-18 DIAGNOSIS — I471 Supraventricular tachycardia, unspecified: Secondary | ICD-10-CM

## 2024-02-18 NOTE — Telephone Encounter (Signed)
 Pt's pharmacy is requesting a refill on medication levothyroxine. Would Dr. Tenny Craw like to refill this non cardiac medication? Please address

## 2024-02-22 ENCOUNTER — Other Ambulatory Visit: Payer: Self-pay | Admitting: Internal Medicine

## 2024-02-22 MED ORDER — XTAMPZA ER 9 MG PO C12A: 9.0000 mg | EXTENDED_RELEASE_CAPSULE | Freq: Three times a day (TID) | ORAL | 0 refills | Status: DC

## 2024-02-24 ENCOUNTER — Other Ambulatory Visit: Payer: Self-pay | Admitting: Internal Medicine

## 2024-02-24 MED ORDER — OXYCODONE HCL 5 MG PO TABS: 5.0000 mg | ORAL_TABLET | Freq: Three times a day (TID) | ORAL | 0 refills | Status: DC | PRN

## 2024-02-27 DIAGNOSIS — J101 Influenza due to other identified influenza virus with other respiratory manifestations: Secondary | ICD-10-CM | POA: Diagnosis not present

## 2024-03-02 ENCOUNTER — Ambulatory Visit: Payer: Medicare HMO | Admitting: Internal Medicine

## 2024-03-02 ENCOUNTER — Encounter: Payer: Self-pay | Admitting: Internal Medicine

## 2024-03-02 VITALS — BP 140/84 | HR 112 | Temp 98.7°F | Resp 16 | Ht 67.0 in | Wt 157.0 lb

## 2024-03-02 DIAGNOSIS — I5032 Chronic diastolic (congestive) heart failure: Secondary | ICD-10-CM

## 2024-03-02 DIAGNOSIS — J449 Chronic obstructive pulmonary disease, unspecified: Secondary | ICD-10-CM | POA: Insufficient documentation

## 2024-03-02 DIAGNOSIS — G894 Chronic pain syndrome: Secondary | ICD-10-CM

## 2024-03-02 NOTE — Assessment & Plan Note (Signed)
 I hear no rales or wheezing on exam today.  I read pulmonary's note.  She will continue on oxygen and continue on her current inhalers and nebs.

## 2024-03-02 NOTE — Assessment & Plan Note (Signed)
 She does not need a refill today but her pain seems to be controlled from changing from [place] to oxycodone.  I have asked them to call [employer] about their new medicare prescription plan for this year.

## 2024-03-02 NOTE — Progress Notes (Signed)
 Office Visit  Subjective   Patient ID: [name]   DOB: 05-19-1941   Age: 83 y.o.   MRN: [id number]   Chief Complaint Chief Complaint  Patient presents with   Follow-up     History of Present Illness She tells me that 3 weeks she contracted the flu where she was seen in the [facility] ER at that time.  She was given tamiflu and recovered.  She does have a history of COPD and last saw pulmonary on 02/09/2024.  She has COPD and oxygen dependent respiratory failure.  She is prone to COPD exacerbations and decompensated congestive heart failure with acute on chronic respiratory failure.  She remains on a Trelegy inhaler and combivent respimat.  She hardly ever has to use the combivent.  She also has albuterol nebulizer which she normally uses 3 times per day.  She has had worsening SOB since the flu but has not needed to increase her oxygen settings.  She remains on 2L of oxygen via [place] at this time.  She also wears oxygen at night.   Previous spirometry in 2018 showed FEV1 at 36%.  She does report that she has had sleep apnea in the past and has a CPAP machine but does not use this.  The pulmonologist wanted her to continue on trelegy and activity as tolerated.  They will see her back in 2 months.   [name] is a 83 yo female where I saw her in 12/2023 for acute on chronic diastolic CHF.  She was having lower extremity swelling and weight gain where I asked her to increase her lasix from 80mg  daily to 80mg  BID for several days.  She also used compression hose and she states this did help with her swelling.  Today she weighs 157 lbs.  She usually weighs 155 lbs but recently she has weighing 165 lbs.  She does weight herself daily.  She is on lasix 80mg  every day and spironolactone 25mg  daily.  She denies any chest pain and no heart palpitations at rest and she has had some worsening SOB.  [name] has a history of chronic diastolic congestive heart failure.  She did see cardiology on 07/2021  where they wanted to start her on farxiga.  Her last visit with cardiology was in 02/2023 and they felt she was stable.  She was previously on Lasix 80mg  daily but when she saw cardiology they recommended to take this as needed.  She has been taking Lasix 80mg  every other day.  She is also on spironolactone 25mg  which she does take daily.  Again, she has had multiple admissions to the hospital in the past for acute on chronic respiratory failure due to her COPD and diastolic CHF. She is not currently on fluid restriction.  She continues to do daily weights where we believe her dry weight is around 167-168 lb however this past year she has been weighing in our office 153-155 lbs.   The patient does have a history of Stage III CKD and a history of cirrhosis and CAD.  Her last ECHO was done on 06/06/2022 and this showed a LVEF 60-65% with normal function and without regional wall motion abnormalities.  There was mild LVH and her LV diastolic parameters were indeterminate.  Her RV systolic function was normal.  Her bilateral atrium were moderately dilated.  There was mild MV regurgitaiton.  There was severe mitral annular calcification.  She had mild AV regurgitation.  She had a previous ECHO  in 10/09/2020 and this showed a normal LVEF of 60-65% with diastolic dysfunction.  She had no regional wall motion abnormalites however she had moderate concentric LVH.  She had moderate bilateral atrial enlargement.  Today, there is no chest pain, no worsening SOB, palpitations or peripheral edema.    [name] is a 83 yo female who returns today for followup of her chronic pain syndrome.  She contacted me last week where her insurance company would not pay for her Xtampza anymore.  She was on Xtampza ER 9mg  BID and we therefore wrote her for oxycodone 5mg  po q 8 hrs prn where she tells me she is taking it 3 times per day.  Today, she states there is no change since her last visit 3 months, despite changing her pain meds.  This  past year, she had worsening pain of her chronic right knee pain.  She did see Dr. Ophelia Charter in ortho in 01/2023 where he noted she has had persistent knee patellar pain since her right knee arthroplasty done in [place] 2007 by Dr.Feder. She had 1 single injection of cortisone in 09/2022 that gave her some relief and Dr. Magnus Ivan discussed with her that repetitive cortisone injections are not a good idea with risks of infection.   X-rays have shown persistent lateral osteophyte or lateral patellofemoral ligament calcification with small crack which looks like a small avulsion.  She was told about this up to 4 years ago and it has been persistent.  She has had knee aspiration in the past which showed no evidence of infection.  Bone scan showed slight increased uptake around the patella.  Serial x-rays showed no evidence of patellar component loosening.  Her pain seems to be worse when she is on her knee more.  Dr. Ophelia Charter discussed with her that he thinks that a patellar revision will unlikely give her pain relief.  This component appears to be well-fixed and he wants her to use a short knee immobilizer that she can wear intermittently to take some pressure off the knee.  She has previously tried a copper sleeve without resolution.  She has chronic pain due to low back pain as well as pain from her history of bilateral knee replacements and bilateral shoulders.   She is currently on Xtampza ER 9mg  po TID but she uses this twice a day.  This seems to help control her pain.  We noted she had acute pain in 2022 where she was having worsening pain with weakness and fatigue.  I thought these symptoms were a combination from recovering from COVID-19 and depression.  She did improve at that time.  Due to her acute pain as described , I spoke to Dr. Welton Flakes in Pain Management around 08/2021 where we decided to hold her Xtampza for a few weeks and put her on short acting oxycodone 5mg  po q 6 hrs prn.  I did restart her on celexa and  her depression improved at that time.  [name] surgery on 01/2020.  She was on gabapentin 200mg  BID for pain as well but is no longer on this. Her chronic pain effects her neck, shoulders, lower back, hips and her knees.  She has seen Dr. Welton Flakes in pain management who recommended placing her on oxycodone ER 10mg  po BID.  The patient could not afford this so we switched her to oxycodone 10mg  po q 6 hr prn which she does take 4 times per day.  She  states she could not function with her day to day activites without her pain medications.  She has been on MS contin and oxycodone ER in the past.  She tells me that this medication gives her fatigue and all she wants to do is sit down and do nothing and is not able to do alot of her ADL's.  She was previously on percocet 10/325mg  po q 6hr prn (which she took 4 times per day) before the MS contin and was on this for years.  She was on long term benzos in the past but again we stopped her off of these.  The patient has seen seen Dr. Edwyna Perfect (orthopedics) in [place] and Dr. Wynetta Emery (neurosurgery).  She remains on cyclobenzaprine as a muscle relaxant.  The patient had had left shoulder surgery in 2014 due to impingement syndrome and a chronic rotator cuff tear and labral tear where they performed a debridement and synvectomy.  This did not correct her shoulder but did reduce the amount of pain she has had in that arm/shoulder.  Her back pain has remained the same.  She also has chronic back pain as well as pain from her history of bilateral knee replacements and bilateral shoulders.   She continues to have pain in both of her knees when she gets up.  She is still using a cane and no longer uses a walker for longer distances.  The patient denies any falls.  She does use a cane to get around and has [employer].  In regards to her chronic lower back pain, her pain mostly affects her when she stands up.  The pain is an intermittent dull  aching she rates as severe at times but again improves with sitting and medications.  There is no pain radiation.  She had back surgery 2012 where her neurosurgeon noted she had worsening back pain with progressive break down of L1-L2 above the levels of her L2-L5 fusion in the past.  They did do surgery and removed some of her hardware.  She has tried PT and pool therapy which she states did not help.  Her pain medicine allows her to do her ADL's including cleaning her house and shopping.  The patient was tried on cymbalta in the past but this made her having nausea and vomiting.  There is no loss of bowel/bladder function or new weakness or numbness.       Past Medical History Past Medical History:  Diagnosis Date   Anemia    bld. transfusion post lumbar surgery- 2012   Anxiety    Arthralgia    NOS   CAD (coronary artery disease)    s/p CABG 2004; s/p DES to LM in 2010;  LHC 10/29/11: EF 50-55%, mild elevated filling pressures, no pulmonary HTN, LM 90% ISR, LAD and CFX occluded, S-RI occluded (old), S-OM3 ok and L-LAD ok, native nondominant RCA 95% -  med rx recommended ; Lexiscan Myoview 7/13 at [facility]: demonstrated "normal LV function, anterior attenuation and localized ischemia, inferior, basilar, mid section"   Carotid artery disease (HCC)    Carotid US 6/19:  RICA 1-39; LICA 40-59; R subclavian stenosis - Repeat 1 year. // Carotid US 05/2019: R 1-39; L 40-59; R vertebral with atypical antegrade flow; R subclavian stenosis >> repeat 1 year // Carotid US 7/21: Bilat 40-59; L subclavian stenosis // Carotid US 7/22: Bilat ICA 40-59; R subclavian stenosis // Carotid US 08/10/23: RICA 1-39, LICA 40-59, L subclavian stenosis   CHF (congestive heart  failure) (HCC) 01/21/2020   Chronic diastolic heart failure (HCC)    Echo 9/10: EF 60-55%, grade 1 diastolic dysfunction   Chronic lower back pain    Chronic respiratory failure (HCC)    Cirrhosis (HCC)    CKD (chronic kidney disease), stage III  (HCC)    COPD (chronic obstructive pulmonary disease) (HCC)    Emphysema dxed by Dr. Sherril Croon in [place] based on PFTs per pt in 2006; placed on albuterol   Degenerative disc disease, lumbar    Depression    TAKES CELEXA  AND  (OFF- WELBUTRIN)   Diuretic-induced hypokalemia 10/08/2018   DVT of lower extremity (deep venous thrombosis) (HCC)    recurrent. bilateral (2 episodes)   Dyspnea    home o2 when needed   Dysrhythmia    afib with cabg   Exertional angina (HCC)    Treated with Isosorbide, Ranexa, amlodipine; intolerant to metoprolol   GERD (gastroesophageal reflux disease)    Gout    "on daily RX" (10/06/2018)   HLD (hyperlipidemia)    Hyperlipidemia    Hypertension    Hypothyroidism    L Subclavian Artery Stenosis    Carotid US 08/10/23: RICA 1-39, LICA 40-59, L subclavian stenosis   Obesity (BMI 30-39.9) 2009   BMI 33   On home oxygen therapy    OSA (obstructive sleep apnea)    Osteoarthritis    "all over; hands" (10/06/2018)   Osteoporosis    Oxygen dependent    4L at home as needed (10/06/2018)   PVD (peripheral vascular disease) (HCC)    Restless leg syndrome    Vitamin D deficiency      Allergies Allergies  Allergen Reactions   Ativan [Lorazepam] Other (See Comments)    Confusion    Pitavastatin Itching and Other (See Comments)    Reaction to Livalo   Ropinirole Nausea Only and Other (See Comments)    Makes legs jump, also   Zofran [Ondansetron Hcl] Nausea Only   Zolpidem Tartrate Anxiety and Other (See Comments)    CONFUSION    Zolpidem Tartrate Swelling    CONFUSION   Penicillins Rash and Other (See Comments)    Tolerated cephalosporins in past Has patient had a PCN reaction causing immediate rash, facial/tongue/throat swelling, SOB or lightheadedness with hypotension: Yes Has patient had a PCN reaction causing severe rash involving mucus membranes or skin necrosis: No Has patient had a PCN reaction that required hospitalization: No Has patient had a  PCN reaction occurring within the last 10 years: No If all of the above answers are "NO", then may proceed with Cephalosporin use.      Medications  Current Outpatient Medications:    albuterol (PROVENTIL) (2.5 MG/3ML) 0.083% nebulizer solution, Take 2.5 mg by nebulization every 6 (six) hours as needed for wheezing or shortness of breath., Disp: , Rfl:    allopurinol (ZYLOPRIM) 100 MG tablet, TAKE 1 TABLET BY MOUTH DAILY WITH BREAKFAST., Disp: 90 tablet, Rfl: 0   alum & mag hydroxide-simeth (MAALOX MAX) 400-400-40 MG/5ML suspension, Take 15 mLs by mouth every 6 (six) hours as needed for indigestion., Disp: 355 mL, Rfl: 0   amLODipine (NORVASC) 2.5 MG tablet, Take 1/2 tablet as needed for BP greater than 160., Disp: 30 tablet, Rfl: 1   ARIPiprazole (ABILIFY) 2 MG tablet, TAKE 1 TABLET BY MOUTH ONCE DAILY, Disp: 90 tablet, Rfl: 0   aspirin EC 81 MG tablet, Take 81 mg by mouth daily with breakfast. Swallow whole., Disp: , Rfl:  atorvastatin (LIPITOR) 20 MG tablet, TAKE 1 TABLET BY MOUTH AT BEDTIME, Disp: 90 tablet, Rfl: 0   cholecalciferol (VITAMIN D3) 25 MCG (1000 UNIT) tablet, Take 1 tablet (1,000 Units total) by mouth daily with supper., Disp: 90 tablet, Rfl: 1   citalopram (CELEXA) 10 MG tablet, TAKE 1 TABLET BY MOUTH DAILY., Disp: 90 tablet, Rfl: 0   cyclobenzaprine (FLEXERIL) 10 MG tablet, Take 1 tablet (10 mg total) by mouth 3 (three) times daily., Disp: 270 tablet, Rfl: 0   D3-1000 25 MCG (1000 UT) capsule, TAKE 1 CAPSULE BY MOUTH DAILY WITH SUPPER., Disp: 90 capsule, Rfl: 0   dapagliflozin propanediol (FARXIGA) 10 MG TABS tablet, Take 1 tablet (10 mg total) by mouth daily before breakfast., Disp: 90 tablet, Rfl: 2   Docusate Sodium (DSS) 100 MG CAPS, Take 100 mg by mouth daily as needed (constipation)., Disp: , Rfl:    donepezil (ARICEPT) 10 MG tablet, TAKE 1 TABLET BY MOUTH AT BEDTIME., Disp: 30 tablet, Rfl: 0   feeding supplement (ENSURE ENLIVE / ENSURE PLUS) LIQD, Take 237 mLs by  mouth 2 (two) times daily between meals., Disp: , Rfl:    ferrous sulfate 325 (65 FE) MG tablet, TAKE 1 TABLET BY MOUTH 2 TIMES DAILY, Disp: 180 tablet, Rfl: 0   Fluticasone-Umeclidin-Vilant (TRELEGY ELLIPTA) 200-62.5-25 MCG/ACT AEPB, Inhale 1 puff into the lungs daily., Disp: 1 each, Rfl: 5   furosemide (LASIX) 80 MG tablet, TAKE 1 TABLET BY MOUTH DAILY., Disp: 90 tablet, Rfl: 3   gabapentin (NEURONTIN) 100 MG capsule, TAKE 2 CAPSULES BY MOUTH TWICE DAILY, Disp: 360 capsule, Rfl: 1   Ipratropium-Albuterol (COMBIVENT RESPIMAT) 20-100 MCG/ACT AERS respimat, Inhale 1 puff into the lungs 3 (three) times daily as needed for wheezing or shortness of breath., Disp: , Rfl:    levothyroxine (SYNTHROID) 50 MCG tablet, TAKE 1 TABLET BY MOUTH DAILY BEFORE BREAKFAST., Disp: 90 tablet, Rfl: 0   Magnesium Oxide -Mg Supplement 500 MG CAPS, Take 1 capsule (500 mg total) by mouth daily., Disp: 90 capsule, Rfl: 0   Magnesium Oxide -Mg Supplement 500 MG CAPS, TAKE 1 CAPSULE BY MOUTH DAILY, Disp: 90 capsule, Rfl: 0   Melatonin 10 MG TABS, Take 10 mg by mouth at bedtime., Disp: , Rfl:    memantine (NAMENDA) 5 MG tablet, Take 1 tablet (5 mg total) by mouth 2 (two) times daily., Disp: 60 tablet, Rfl: 2   metoprolol tartrate (LOPRESSOR) 25 MG tablet, TAKE 1/2 TABLETS BY MOUTH TWO TIMES DAILY., Disp: 15 tablet, Rfl: 0   Multiple Vitamin (MULTIVITAMIN WITH MINERALS) TABS tablet, Take 1 tablet by mouth daily., Disp: , Rfl:    nitroGLYCERIN (NITROSTAT) 0.4 MG SL tablet, Place 1 tablet (0.4 mg total) under the tongue every 5 (five) minutes as needed for chest pain. For chest pain, Disp: 25 tablet, Rfl: 3   oxyCODONE (OXY IR/ROXICODONE) 5 MG immediate release tablet, Take 1 tablet (5 mg total) by mouth every 8 (eight) hours as needed for severe pain (pain score 7-10)., Disp: 90 tablet, Rfl: 0   OXYGEN, Inhale 4 L/min into the lungs as needed (DURING ALL TIMES OF EXERTION). , Disp: , Rfl:    pantoprazole (PROTONIX) 40 MG tablet,  Take 1 tablet (40 mg total) by mouth 2 (two) times daily., Disp: 60 tablet, Rfl: 0   pilocarpine (PILOCAR) 1 % ophthalmic solution, Place 1 drop into both eyes 2 (two) times daily., Disp: , Rfl:    polyethylene glycol (MIRALAX / GLYCOLAX) 17 g packet, Take 17  g by mouth as needed., Disp: , Rfl:    potassium chloride SA (KLOR-CON M) 20 MEQ tablet, TAKE 1 TABLET ONCE DAILY WITH FOOD, Disp: 90 tablet, Rfl: 0   pramipexole (MIRAPEX) 0.25 MG tablet, TAKE 1 TABLET 2-3 HOURS BEFORE BEDTIME, Disp: 90 tablet, Rfl: 0   spironolactone (ALDACTONE) 25 MG tablet, TAKE 1 TABLET BY MOUTH DAILY., Disp: 90 tablet, Rfl: 1   traZODone (DESYREL) 50 MG tablet, TAKE 3 TABLETS EVERY NIGHT AT BEDTIME., Disp: 270 tablet, Rfl: 0   vitamin C (ASCORBIC ACID) 500 MG tablet, Take 500 mg by mouth daily with lunch., Disp: , Rfl:    Review of Systems Review of Systems  Constitutional:  Negative for chills and fever.  Eyes:  Negative for blurred vision and double vision.  Respiratory:  Positive for cough and shortness of breath. Negative for sputum production and wheezing.   Cardiovascular:  Negative for chest pain, palpitations and leg swelling.  Gastrointestinal:  Negative for abdominal pain, constipation, diarrhea, nausea and vomiting.  Genitourinary:  Negative for frequency.  Musculoskeletal:  Negative for myalgias.  Skin:  Negative for rash.  Neurological:  Negative for dizziness, weakness and headaches.  Endo/Heme/Allergies:  Negative for polydipsia.       Objective:    Vitals BP (!) 140/84   Pulse (!) 112   Temp 98.7 F (37.1 C)   Resp 16   Ht 5\' 7"  (1.702 m)   Wt 157 lb (71.2 kg)   SpO2 94%   BMI 24.59 kg/m    Physical Examination Physical Exam Constitutional:      Appearance: Normal appearance. She is not ill-appearing.  HENT:     Right Ear: Tympanic membrane, ear canal and external ear normal.     Left Ear: Tympanic membrane, ear canal and external ear normal.     Mouth/Throat:     Mouth: Mucous  membranes are dry.     Pharynx: Oropharynx is clear. No oropharyngeal exudate or posterior oropharyngeal erythema.  Cardiovascular:     Rate and Rhythm: Normal rate and regular rhythm.     Pulses: Normal pulses.     Heart sounds: No murmur heard.    No friction rub. No gallop.  Pulmonary:     Effort: Pulmonary effort is normal. No respiratory distress.     Breath sounds: No wheezing, rhonchi or rales.  Abdominal:     General: Bowel sounds are normal. There is no distension.     Palpations: Abdomen is soft.     Tenderness: There is no abdominal tenderness.  Musculoskeletal:     Right lower leg: No edema.     Left lower leg: No edema.  Skin:    General: Skin is warm and dry.     Findings: No rash.  Neurological:     General: No focal deficit present.     Mental Status: She is alert and oriented to person, place, and time.  Psychiatric:        Mood and Affect: Mood normal.        Behavior: Behavior normal.        Assessment & Plan:   Chronic diastolic (congestive) heart failure (HCC) She is not volume overloaded today and is compensated.  There is no rales and no peripheral edema on exam.  I want her to drink more fluids with recovering from her flu as she looks a little dry.  Chronic obstructive pulmonary disease (HCC) I hear no rales or wheezing on exam today.  I read  pulmonary's note.  She will continue on oxygen and continue on her current inhalers and nebs.  Chronic pain She does not need a refill today but her pain seems to be controlled from changing from Xtampza to oxycodone.  I have asked them to call [employer] about their new medicare prescription plan for this year.    Return in about 3 months (around 06/02/2024).   Crist Fat, MD

## 2024-03-02 NOTE — Assessment & Plan Note (Signed)
 She is not volume overloaded today and is compensated.  There is no rales and no peripheral edema on exam.  I want her to drink more fluids with recovering from her flu as she looks a little dry.

## 2024-03-09 ENCOUNTER — Other Ambulatory Visit: Payer: Self-pay | Admitting: Internal Medicine

## 2024-03-31 ENCOUNTER — Ambulatory Visit: Payer: Medicare HMO | Admitting: Internal Medicine

## 2024-03-31 ENCOUNTER — Encounter: Payer: Self-pay | Admitting: Internal Medicine

## 2024-03-31 ENCOUNTER — Other Ambulatory Visit: Payer: Self-pay | Admitting: Internal Medicine

## 2024-03-31 VITALS — BP 149/81 | HR 106 | Temp 98.5°F | Ht 68.0 in | Wt 158.6 lb

## 2024-03-31 DIAGNOSIS — Z8709 Personal history of other diseases of the respiratory system: Secondary | ICD-10-CM | POA: Diagnosis not present

## 2024-03-31 DIAGNOSIS — J449 Chronic obstructive pulmonary disease, unspecified: Secondary | ICD-10-CM | POA: Diagnosis not present

## 2024-03-31 DIAGNOSIS — R54 Age-related physical debility: Secondary | ICD-10-CM

## 2024-03-31 DIAGNOSIS — R5381 Other malaise: Secondary | ICD-10-CM

## 2024-03-31 DIAGNOSIS — Z87891 Personal history of nicotine dependence: Secondary | ICD-10-CM | POA: Diagnosis not present

## 2024-03-31 DIAGNOSIS — R053 Chronic cough: Secondary | ICD-10-CM

## 2024-03-31 DIAGNOSIS — R0989 Other specified symptoms and signs involving the circulatory and respiratory systems: Secondary | ICD-10-CM

## 2024-03-31 MED ORDER — TRELEGY ELLIPTA 200-62.5-25 MCG/ACT IN AEPB: 200.0000 mg | INHALATION_SPRAY | Freq: Every day | RESPIRATORY_TRACT | Status: DC

## 2024-03-31 MED ORDER — FLUTICASONE FUROATE-VILANTEROL 200-25 MCG/ACT IN AEPB: 1.0000 | INHALATION_SPRAY | Freq: Every day | RESPIRATORY_TRACT | Status: DC

## 2024-03-31 MED ORDER — PREDNISONE 10 MG PO TABS
ORAL_TABLET | ORAL | 0 refills | Status: DC
Start: 2024-03-31 — End: 2024-06-01

## 2024-03-31 NOTE — Progress Notes (Signed)
 OV 06/03/2021  Subjective:  Patient ID: [name], female , DOB: [birth year] , age 83 y.o. , MRN: [id number] , ADDRESS: [address] [place] [address] PCP Crist Fat, MD Patient Care Team: Leonia Reader, Barbara Cower, MD as PCP - General (Internal Medicine) Pricilla Riffle, MD as PCP - Cardiology (Cardiology) Donalee Citrin, MD as Consulting Physician (Neurosurgery) Fleeta Emmer, RN as [facility] Care Management  This Provider for this visit: Treatment Team:  Attending Provider: Kalman Shan, MD    06/03/2021 -   Chief Complaint  Patient presents with   Hospitalization Follow-up    Pt hospitalized 5/9-5/14 and then again 5/20-5/23.  Pt states she is slowly getting better since she has been out of the hospital. States she is SOB all the time which is worse when she stands and with activities.     HPI [name] 83 y.o. -last seen by myself in 2009.  At that time diagnosed with obstructive lung disease.  In 2018 FEV1 0.86 L / 36%.  She tells me that up until 3 years ago she was fairly stable but then after that she had a significant admission with renal failure and after that has had 5 admissions.  She has been on oxygen for the last 3 years since admission with renal failure.  Her granddaughter Lequita Halt is with her and is also giving some of the history.  Generally admissions are for medical issues including diastolic heart failure.  Most recent May 2022 admission was from obstructive lung disease exacerbation.  Critical care medicine saw the patient.  She has since been referred here.  Review of the records indicate last blood gas was in 2019.  Her last echocardiogram was in October 2021.  She tells me that she is significant amount of dyspnea even for minimal exertion.  This is really making her life difficult.  She is very deconditioned.  She is able to change clothes but her ECOG is around 3 and she has class III dyspnea.  She is chronic venous  stasis edema with redness.  Home care agency tell status not cellulitis but she feels her legs are burning.  She has appointment with primary care physician tomorrow.  She has a remote 45 pack smoking history.  CT Chest data May 2022 pulmonary embolism ruled out.  No obvious evidence of emphysema reported.  No pneumonia.   07/23/2021 Follow up : COPD and O2 RF  Patient returns for a 6-week follow-up.  Patient was seen last visit for a pulmonary consult to establish for COPD and oxygen dependent respiratory failure.  She is prone to COPD exacerbations and decompensated congestive heart failure with acute on chronic respiratory failure.  She has had 2-3 hospitalizations over this past year. Last admission she was started on Trelegy inhaler.  She needs a refill of this.  She uses Combivent as needed.  She says overall breathing is doing better today but she has good days and bad days where she gets very short of breath with minimal activity.  Previous spirometry in 2018 showed FEV1 at 36%.  She is followed by cardiology and is on a maintenance dose for congestive heart failure.  She has chronic lower extremity swelling.  Is on diuretics.She was set up for a 2D echo and ABG unfortunately patient did not have these done.  She missed these appointments.  She does report that she has had sleep apnea in the past and has a CPAP machine  but does not use this. Recent lab work does show an elevated BNP as patient has known congestive heart failure.  Patient was seen by cardiology this morning.  Previously was on Daliresp but was unable to tolerate and is no longer taking.  Patient has a chronic pain syndrome and is on multiple sedating medications.  We discussed the dangers of daily narcotics and sedating medications. Patient has not been vaccinated for COVID-19.  She refuses COVID-vaccine.  Precautions were discussed.  Patient is on chronic oxygen.  Most recent hospitalization she was recommended to use 4 L of oxygen.   Today in the office O2 saturations were 96% on room air at rest and walking O2 saturations remained at 96% for 1 lap walking with no desaturations on room air. Patient denies any hemoptysis, chest pain, or fever  Medical history significant for diastolic heart failure, diabetes and chronic kidney disease Chronic pain on daily narcotics. , CAD s/p CABG , PVD , PE on anticoagulation, OSA    TEST/EVENTS :  Spirometry in 2018 FEV1 0.86 L / 36%.  CT chest May 10, 2021 showed no pulmonary embolism.  Stable biapical pleural-parenchymal scarring.   OV 03/31/2024 -   Subjective:  Patient ID: [name], female , DOB: 20-Mar-1941 , age 83 y.o. , MRN: [id number] , ADDRESS: [address] [place] [address] PCP Crist Fat, MD Patient Care Team: Leonia Reader, Barbara Cower, MD as PCP - General (Internal Medicine) Pricilla Riffle, MD as PCP - Cardiology (Cardiology) Donalee Citrin, MD as Consulting Physician (Neurosurgery) Kennon Rounds as Physician Assistant (Cardiology)  This Provider for this visit: Treatment Team:  Attending Provider: Kalman Shan, MD    03/31/2024 -   Chief Complaint  Patient presents with   Follow-up     HPI [name] 83 y.o. -almost new visit but not because it has been slightly less than 3 years since I last saw her.  She has severe COPD on Trelegy.  She is here with her son.  She lives alone but she has constant support from her son and daughter.  She says she has had low back surgery 5 times.  And also total knee replacement from 15 years ago.  For the last 3 years she has been using a walker on and off but there is increased use of this in the last 6 months because of increasing back pain in the last 2 months as well as increased knee pain.  She is in constant pain.  Then in January 2025 and February 2023 she ended up with influenza A 2 times.  She was in the ER but not hospitalized.  It did not change her oxygen need.  Therefore this  follow-up was made at this point in time she is almost back to baseline except she has significant cough.  She is trying to get mucus.  Her last CT scan of the chest was in 2022 and there was some postinflammatory atelectasis in the lung bases but no obvious ILD.  Review of the records indicate that she is in need of alpha-1 antitrypsin check.  Exercise hypoxemia test today showed that her heart rate or her pulse ox did not even change with exercise.  She only was able to sit stand 5 or 7 times from and she gave up because of back pain and knee pain.   SYMPTOM SCALE -COPD 03/31/2024  Current weight   O2 use ra  Shortness of Breath 0 -> 5  scale with 5 being worst (score 6 If unable to do)  At rest 3  Simple tasks - showers, clothes change, eating, shaving 5  Household (dishes, doing bed, laundry) 2  Shopping 0  Walking level at own pace 5  Walking up Stairs 0  Total (30-36) Dyspnea Score 15  How bad is your cough? 5  How bad is your fatigue 4  How bad is nausea 0  How bad is vomiting?  0  How bad is diarrhea? 0  How bad is anxiety? 5  How bad is depression 5  Any chronic pain - if so where and how bad Severe back and knee. Uses walker        SIT STAND TEST - goal 15 times   03/31/2024    O2 used ra   PRobe - finter or forehead forehead   Number sit and stand completed - goal 15 Only did 7   Time taken to complete 37 secds   Resting Pulse Ox/HR/Dyspnea  99% and 107/min and dyspnea of 7/10    Peak measures 99 % and 107/min and dyspnea of 8/10   Final Pulse Ox/HR 98% and 108/min and dyspnea of x/10   Desaturated </= 88% no   Desaturated <= 3% points no   Got Tachycardic >/= 90/min Yes even at rest   Miscellaneous comments Dyspneic, weak in legs       PFT     Latest Ref Rng & Units 09/29/2017    3:52 PM  PFT Results  FVC-Pre L 1.71   FVC-Predicted Pre % 55   FVC-Post L 1.92   FVC-Predicted Post % 62   Pre FEV1/FVC % % 50   Post FEV1/FCV % % 52   FEV1-Pre L 0.86    FEV1-Predicted Pre % 36   FEV1-Post L 1.01   DLCO uncorrected ml/min/mmHg 16.04   DLCO UNC% % 56   DLCO corrected ml/min/mmHg 14.67   DLCO COR %Predicted % 51   DLVA Predicted % 83        LAB RESULTS last 96 hours No results found.    Latest Reference Range & Units 06/20/21 13:32 01/09/23 11:41 08/20/23 10:44 01/15/24 15:15  EOS (ABSOLUTE) 0.0 - 0.4 x10E3/uL 0.1 0.2 0.1 0.2      has a past medical history of Anemia, Anxiety, Arthralgia, CAD (coronary artery disease), Carotid artery disease (HCC), CHF (congestive heart failure) (HCC) (01/21/2020), Chronic diastolic heart failure (HCC), Chronic lower back pain, Chronic respiratory failure (HCC), Cirrhosis (HCC), CKD (chronic kidney disease), stage III (HCC), COPD (chronic obstructive pulmonary disease) (HCC), Degenerative disc disease, lumbar, Depression, Diuretic-induced hypokalemia (10/08/2018), DVT of lower extremity (deep venous thrombosis) (HCC), Dyspnea, Dysrhythmia, Exertional angina (HCC), GERD (gastroesophageal reflux disease), Gout, HLD (hyperlipidemia), Hyperlipidemia, Hypertension, Hypothyroidism, L Subclavian Artery Stenosis, Obesity (BMI 30-39.9) (2009), On home oxygen therapy, OSA (obstructive sleep apnea), Osteoarthritis, Osteoporosis, Oxygen dependent, PVD (peripheral vascular disease) (HCC), Restless leg syndrome, and Vitamin D deficiency.   reports that she quit smoking about 25 years ago. Her smoking use included cigarettes. She started smoking about 70 years ago. She has a 45 pack-year smoking history. She has never used smokeless tobacco.  Past Surgical History:  Procedure Laterality Date   ANKLE SURGERY  2004   ANTERIOR CERVICAL DECOMP/DISCECTOMY FUSION  11/2007   AUGMENTATION MAMMAPLASTY     BACK SURGERY  1986   lower; another scheduled, opt. reports 4 back- lumbar, 3 cerv. fusions  for later 2009   CARDIAC CATHETERIZATION  11/2009  Patent LIMA to LAD and patent SVG to OM1. Occluded SVG to ramus and diagonal.  Left main: 90% ostial stenosis, LCX 60-70% proximal stenosis   CATARACT EXTRACTION W/ INTRAOCULAR LENS  IMPLANT, BILATERAL Bilateral 2006   COLONOSCOPY WITH PROPOFOL N/A 02/17/2017   Procedure: COLONOSCOPY WITH PROPOFOL;  Surgeon: Beverley Fiedler, MD;  Location[facility] ENDOSCOPY;  Service: Endoscopy;  Laterality: N/A;   CORONARY ANGIOPLASTY WITH STENT PLACEMENT  11/2009   Drug eluting stent to left main artery: 4.0 X 12 mm Ion    CORONARY ARTERY BYPASS GRAFT  2004   "CABG X4"   ESOPHAGOGASTRODUODENOSCOPY N/A 02/14/2017   Procedure: ESOPHAGOGASTRODUODENOSCOPY (EGD);  Surgeon: Sherrilyn Rist, MD;  Location: [facility] ENDOSCOPY;  Service: Endoscopy;  Laterality: N/A;   GIVENS CAPSULE STUDY N/A 02/15/2017   Procedure: GIVENS CAPSULE STUDY;  Surgeon: Sherrilyn Rist, MD;  Location: [facility] ENDOSCOPY;  Service: Endoscopy;  Laterality: N/A;   GROIN MASS OPEN BIOPSY  2004   IR IVC FILTER PLMT / S&I /IMG GUID/MOD SED  03/14/2018   IR PARACENTESIS  03/10/2018   JOINT REPLACEMENT     LAPAROSCOPIC CHOLECYSTECTOMY     PERIPHERAL VASCULAR INTERVENTION Left 03/05/2018   Procedure: PERIPHERAL VASCULAR INTERVENTION;  Surgeon: Sherren Kerns, MD;  Location: [facility] INVASIVE CV LAB;  Service: Cardiovascular;  Laterality: Left;  Attempted unsuccess\ful Per Dr. Jettie Booze   PERIPHERAL VASCULAR INTERVENTION  03/08/2018   Procedure: PERIPHERAL VASCULAR INTERVENTION;  Surgeon: Maeola Harman, MD;  Location: [facility] INVASIVE CV LAB;  Service: Cardiovascular;;  SMA Stent    REPLACEMENT TOTAL KNEE BILATERAL Bilateral 2012   REVERSE SHOULDER ARTHROPLASTY  02/26/2012   Procedure: REVERSE SHOULDER ARTHROPLASTY;  Surgeon: Senaida Lange, MD;  Location: [facility] OR;  Service: Orthopedics;  Laterality: Right;  RIGHT SHOULDER REVERSED ARTHROPLASTY   SHOULDER ARTHROSCOPY WITH SUBACROMIAL DECOMPRESSION Left 02/10/2013   Procedure: SHOULDER ARTHROSCOPY WITH SUBACROMIAL DECOMPRESSION DISTAL CLAVICLE RESECTION;  Surgeon: Senaida Lange, MD;  Location: [facility] OR;   Service: Orthopedics;  Laterality: Left;  DISTAL CLAVICLE RESECTION   TONSILLECTOMY     TOTAL HIP ARTHROPLASTY  2007   Right   TOTAL HIP ARTHROPLASTY Left 02/07/2020   Procedure: TOTAL HIP ARTHROPLASTY ANTERIOR APPROACH;  Surgeon: Durene Romans, MD;  Location: [facility] ORS;  Service: Orthopedics;  Laterality: Left;  70 mins   TRANSURETHRAL RESECTION OF BLADDER TUMOR N/A 05/31/2018   Procedure: TRANSURETHRAL RESECTION OF BLADDER TUMOR (TURBT);  Surgeon: Crista Elliot, MD;  Location: [facility] ORS;  Service: Urology;  Laterality: N/A;   VISCERAL ANGIOGRAPHY N/A 03/05/2018   Procedure: VISCERAL ANGIOGRAPHY;  Surgeon: Sherren Kerns, MD;  Location: [facility] INVASIVE CV LAB;  Service: Cardiovascular;  Laterality: N/A;   VISCERAL ANGIOGRAPHY N/A 03/08/2018   Procedure: VISCERAL ANGIOGRAPHY;  Surgeon: Maeola Harman, MD;  Location: [facility] INVASIVE CV LAB;  Service: Cardiovascular;  Laterality: N/A;    Allergies  Allergen Reactions   Ativan [Lorazepam] Other (See Comments)    Confusion    Pitavastatin Itching and Other (See Comments)    Reaction to Livalo   Ropinirole Nausea Only and Other (See Comments)    Makes legs jump, also   Zofran [Ondansetron Hcl] Nausea Only   Zolpidem Tartrate Anxiety and Other (See Comments)    CONFUSION    Zolpidem Tartrate Swelling    CONFUSION   Penicillins Rash and Other (See Comments)    Tolerated cephalosporins in past Has patient had a PCN reaction causing immediate rash, facial/tongue/throat swelling, SOB or lightheadedness with  hypotension: Yes Has patient had a PCN reaction causing severe rash involving mucus membranes or skin necrosis: No Has patient had a PCN reaction that required hospitalization: No Has patient had a PCN reaction occurring within the last 10 years: No If all of the above answers are "NO", then may proceed with Cephalosporin use.     Immunization History  Administered Date(s) Administered   Influenza, High Dose Seasonal PF 09/25/2020    Influenza-Unspecified 09/30/2018    Family History  Problem Relation Age of Onset   Ovarian cancer Mother    Lung cancer Father    COPD Father    Heart disease Father    Heart disease Sister    Stomach cancer Sister    Colitis Sister      Current Outpatient Medications:    albuterol (PROVENTIL) (2.5 MG/3ML) 0.083% nebulizer solution, Take 2.5 mg by nebulization every 6 (six) hours as needed for wheezing or shortness of breath., Disp: , Rfl:    allopurinol (ZYLOPRIM) 100 MG tablet, TAKE 1 TABLET BY MOUTH DAILY WITH BREAKFAST., Disp: 90 tablet, Rfl: 0   alum & mag hydroxide-simeth (MAALOX MAX) 400-400-40 MG/5ML suspension, Take 15 mLs by mouth every 6 (six) hours as needed for indigestion., Disp: 355 mL, Rfl: 0   amLODipine (NORVASC) 2.5 MG tablet, Take 1/2 tablet as needed for BP greater than 160., Disp: 30 tablet, Rfl: 1   ARIPiprazole (ABILIFY) 2 MG tablet, TAKE 1 TABLET BY MOUTH ONCE DAILY, Disp: 90 tablet, Rfl: 0   aspirin EC 81 MG tablet, Take 81 mg by mouth daily with breakfast. Swallow whole., Disp: , Rfl:    atorvastatin (LIPITOR) 20 MG tablet, TAKE 1 TABLET BY MOUTH AT BEDTIME, Disp: 90 tablet, Rfl: 0   cholecalciferol (VITAMIN D3) 25 MCG (1000 UNIT) tablet, Take 1 tablet (1,000 Units total) by mouth daily with supper., Disp: 90 tablet, Rfl: 1   citalopram (CELEXA) 10 MG tablet, TAKE 1 TABLET BY MOUTH DAILY., Disp: 90 tablet, Rfl: 0   cyclobenzaprine (FLEXERIL) 10 MG tablet, Take 1 tablet (10 mg total) by mouth 3 (three) times daily., Disp: 270 tablet, Rfl: 0   D3-1000 25 MCG (1000 UT) capsule, TAKE 1 CAPSULE BY MOUTH DAILY WITH SUPPER., Disp: 90 capsule, Rfl: 0   dapagliflozin propanediol (FARXIGA) 10 MG TABS tablet, Take 1 tablet (10 mg total) by mouth daily before breakfast., Disp: 90 tablet, Rfl: 2   Docusate Sodium (DSS) 100 MG CAPS, Take 100 mg by mouth daily as needed (constipation)., Disp: , Rfl:    donepezil (ARICEPT) 10 MG tablet, TAKE 1 TABLET BY MOUTH AT BEDTIME.,  Disp: 30 tablet, Rfl: 0   feeding supplement (ENSURE ENLIVE / ENSURE PLUS) LIQD, Take 237 mLs by mouth 2 (two) times daily between meals., Disp: , Rfl:    ferrous sulfate 325 (65 FE) MG tablet, TAKE 1 TABLET BY MOUTH 2 TIMES DAILY, Disp: 180 tablet, Rfl: 0   Fluticasone-Umeclidin-Vilant (TRELEGY ELLIPTA) 200-62.5-25 MCG/ACT AEPB, Inhale 1 puff into the lungs daily., Disp: 1 each, Rfl: 5   Fluticasone-Umeclidin-Vilant (TRELEGY ELLIPTA) 200-62.5-25 MCG/ACT AEPB, Inhale 200 mg into the lungs daily., Disp: , Rfl:    furosemide (LASIX) 80 MG tablet, TAKE 1 TABLET BY MOUTH DAILY., Disp: 90 tablet, Rfl: 3   gabapentin (NEURONTIN) 100 MG capsule, TAKE 2 CAPSULES BY MOUTH TWICE DAILY, Disp: 360 capsule, Rfl: 1   Ipratropium-Albuterol (COMBIVENT RESPIMAT) 20-100 MCG/ACT AERS respimat, Inhale 1 puff into the lungs 3 (three) times daily as needed for wheezing or shortness  of breath., Disp: , Rfl:    Magnesium Oxide -Mg Supplement 500 MG CAPS, Take 1 capsule (500 mg total) by mouth daily., Disp: 90 capsule, Rfl: 0   Magnesium Oxide -Mg Supplement 500 MG CAPS, TAKE 1 CAPSULE BY MOUTH DAILY, Disp: 90 capsule, Rfl: 0   Melatonin 10 MG TABS, Take 10 mg by mouth at bedtime., Disp: , Rfl:    memantine (NAMENDA) 5 MG tablet, TAKE 1 TABLET BY MOUTH 2 TIMES DAILY., Disp: 60 tablet, Rfl: 1   metoprolol tartrate (LOPRESSOR) 25 MG tablet, TAKE 1/2 TABLETS BY MOUTH TWO TIMES DAILY., Disp: 15 tablet, Rfl: 0   Multiple Vitamin (MULTIVITAMIN WITH MINERALS) TABS tablet, Take 1 tablet by mouth daily., Disp: , Rfl:    nitroGLYCERIN (NITROSTAT) 0.4 MG SL tablet, Place 1 tablet (0.4 mg total) under the tongue every 5 (five) minutes as needed for chest pain. For chest pain, Disp: 25 tablet, Rfl: 3   oxyCODONE (OXY IR/ROXICODONE) 5 MG immediate release tablet, Take 1 tablet (5 mg total) by mouth every 8 (eight) hours as needed for severe pain (pain score 7-10)., Disp: 90 tablet, Rfl: 0   OXYGEN, Inhale 4 L/min into the lungs as needed  (DURING ALL TIMES OF EXERTION). 3L at night when needed., Disp: , Rfl:    pantoprazole (PROTONIX) 40 MG tablet, Take 1 tablet (40 mg total) by mouth 2 (two) times daily., Disp: 60 tablet, Rfl: 0   pilocarpine (PILOCAR) 1 % ophthalmic solution, Place 1 drop into both eyes 2 (two) times daily., Disp: , Rfl:    polyethylene glycol (MIRALAX / GLYCOLAX) 17 g packet, Take 17 g by mouth as needed., Disp: , Rfl:    potassium chloride SA (KLOR-CON M) 20 MEQ tablet, TAKE 1 TABLET ONCE DAILY WITH FOOD, Disp: 90 tablet, Rfl: 0   pramipexole (MIRAPEX) 0.25 MG tablet, TAKE 1 TABLET 2-3 HOURS BEFORE BEDTIME, Disp: 90 tablet, Rfl: 0   predniSONE (DELTASONE) 10 MG tablet, Please take prednisone 40 mg x1 day, then 30 mg x1 day, then 20 mg x1 day, then 10 mg x1 day, and then 5 mg x1 day and stop, Disp: 11 tablet, Rfl: 0   spironolactone (ALDACTONE) 25 MG tablet, TAKE 1 TABLET BY MOUTH DAILY., Disp: 90 tablet, Rfl: 1   traZODone (DESYREL) 50 MG tablet, TAKE 3 TABLETS EVERY NIGHT AT BEDTIME., Disp: 270 tablet, Rfl: 0   vitamin C (ASCORBIC ACID) 500 MG tablet, Take 500 mg by mouth daily with lunch., Disp: , Rfl:       Objective:   Vitals:   03/31/24 1552  BP: (!) 149/81  Pulse: (!) 106  Temp: 98.5 F (36.9 C)  TempSrc: Oral  SpO2: 93%  Weight: 158 lb 9.6 oz (71.9 kg)  Height: 5\' 8"  (1.727 m)    Estimated body mass index is 24.12 kg/m as calculated from the following:   Height as of this encounter: 5\' 8"  (1.727 m).   Weight as of this encounter: 158 lb 9.6 oz (71.9 kg).  @WEIGHTCHANGE @  [employer]   03/31/24 1552  Weight: 158 lb 9.6 oz (71.9 kg)     Physical Exam   General: No distress. FRAIL OSTeOPOROTIC O2 at rest: no Cane present: no Sitting in wheel chair: no HAS WALKER + Frail: yes Obese: no Neuro: Alert and Oriented x 3. GCS 15. Speech normal Psych: Pleasant Resp:  Barrel Chest - no.  Wheeze - no, Crackles - YES BASE, No overt respiratory distress CVS: Normal heart sounds.  Murmurs -  no Ext: Stigmata of Connective Tissue Disease - no HEENT: Normal upper airway. PEERL +. No post nasal drip        Assessment:       ICD-10-CM   1. Stage 3 severe COPD by GOLD classification (HCC)  J44.9 Alpha-1 antitrypsin phenotype    CBC with Differential    CT Chest High Resolution    Alpha-1 antitrypsin phenotype    CBC with Differential    Fluticasone-Umeclidin-Vilant (TRELEGY ELLIPTA) 200-62.5-25 MCG/ACT AEPB    DISCONTINUED: fluticasone furoate-vilanterol (BREO ELLIPTA) 200-25 MCG/ACT 1 puff    2. History of influenza  Z87.09 Alpha-1 antitrypsin phenotype    CBC with Differential    CT Chest High Resolution    Alpha-1 antitrypsin phenotype    CBC with Differential    DISCONTINUED: fluticasone furoate-vilanterol (BREO ELLIPTA) 200-25 MCG/ACT 1 puff    3. Chronic cough  R05.3 Alpha-1 antitrypsin phenotype    CBC with Differential    CT Chest High Resolution    Alpha-1 antitrypsin phenotype    CBC with Differential    DISCONTINUED: fluticasone furoate-vilanterol (BREO ELLIPTA) 200-25 MCG/ACT 1 puff    4. Bibasilar crackles  R09.89 Alpha-1 antitrypsin phenotype    CBC with Differential    CT Chest High Resolution    Alpha-1 antitrypsin phenotype    CBC with Differential    DISCONTINUED: fluticasone furoate-vilanterol (BREO ELLIPTA) 200-25 MCG/ACT 1 puff    5. Frailty  R54     6. Physical debility  R53.81          Plan:     Patient Instructions     ICD-10-CM   1. Stage 3 severe COPD by GOLD classification (HCC)  J44.9     2. History of influenza  Z87.09     3. Chronic cough  R05.3     4. Bibasilar crackles  R09.89      #Chronic cough along with COPD  -Unclear if this is post flu reactive cough or maybe you have developed problem where you have interstitial lung disease because I could hear crackles on the lung  Plan -Check CBC with differential blood count today   -If eosinophils are elevated and if cough persists and a couple of months  from now we could try Dupixent -Check alpha-1 antitrypsin phenotype today - Do 5-day prednisone taper - Do CT scan of the chest in 6-8 weeks   Follow-up  - 6-8 weeks with nurse practitioner but after completing CT scan   FOLLOWUP Return in about 7 weeks (around 05/19/2024) for with any of the APPS, Face to Face Visit.    SIGNATURE    Dr. Kalman Shan, M.D., F.C.C.P,  Pulmonary and Critical Care Medicine Staff Physician, [facility] Director - Interstitial Lung Disease  Program  Pulmonary Fibrosis [facility] at [facility] [place], [place], [address]  Pager: [phone], If no answer or between  15:00h - 7:00h: call [phone] Telephone: [phone]  5:11 PM 03/31/2024

## 2024-03-31 NOTE — Patient Instructions (Addendum)
 ICD-10-CM   1. Stage 3 severe COPD by GOLD classification (HCC)  J44.9     2. History of influenza  Z87.09     3. Chronic cough  R05.3     4. Bibasilar crackles  R09.89      #Chronic cough along with COPD  -Unclear if this is post flu reactive cough or maybe you have developed problem where you have interstitial lung disease because I could hear crackles on the lung  Plan -Check CBC with differential blood count today   -If eosinophils are elevated and if cough persists and a couple of months from now we could try Dupixent -Check alpha-1 antitrypsin phenotype today - Do 5-day prednisone taper - Do CT scan of the chest in 6-8 weeks   Follow-up  - 6-8 weeks with nurse practitioner but after completing CT scan

## 2024-04-01 LAB — CBC WITH DIFFERENTIAL/PLATELET
Basophils Absolute: 0.1 10*3/uL (ref 0.0–0.1)
Basophils Relative: 0.8 % (ref 0.0–3.0)
Eosinophils Absolute: 0.2 10*3/uL (ref 0.0–0.7)
Eosinophils Relative: 2.3 % (ref 0.0–5.0)
HCT: 44.7 % (ref 36.0–46.0)
Hemoglobin: 14.5 g/dL (ref 12.0–15.0)
Lymphocytes Relative: 20.2 % (ref 12.0–46.0)
Lymphs Abs: 1.9 10*3/uL (ref 0.7–4.0)
MCHC: 32.5 g/dL (ref 30.0–36.0)
MCV: 89.6 fl (ref 78.0–100.0)
Monocytes Absolute: 0.9 10*3/uL (ref 0.1–1.0)
Monocytes Relative: 9.9 % (ref 3.0–12.0)
Neutro Abs: 6.4 10*3/uL (ref 1.4–7.7)
Neutrophils Relative %: 66.8 % (ref 43.0–77.0)
Platelets: 223 10*3/uL (ref 150.0–400.0)
RBC: 4.99 Mil/uL (ref 3.87–5.11)
RDW: 16.9 % — ABNORMAL HIGH (ref 11.5–15.5)
WBC: 9.6 10*3/uL (ref 4.0–10.5)

## 2024-04-04 ENCOUNTER — Other Ambulatory Visit: Payer: Self-pay | Admitting: Internal Medicine

## 2024-04-04 MED ORDER — OXYCODONE HCL 5 MG PO TABS: 5.0000 mg | ORAL_TABLET | Freq: Three times a day (TID) | ORAL | 0 refills | Status: DC | PRN

## 2024-04-04 MED ORDER — CYCLOBENZAPRINE HCL 10 MG PO TABS: 10.0000 mg | ORAL_TABLET | Freq: Three times a day (TID) | ORAL | 0 refills | Status: DC

## 2024-04-05 LAB — ALPHA-1 ANTITRYPSIN PHENOTYPE: A-1 Antitrypsin, Ser: 155 mg/dL (ref 83–199)

## 2024-04-07 ENCOUNTER — Encounter: Payer: Self-pay | Admitting: Internal Medicine

## 2024-04-07 NOTE — Progress Notes (Signed)
 normal

## 2024-04-13 ENCOUNTER — Other Ambulatory Visit: Payer: Self-pay | Admitting: Internal Medicine

## 2024-04-18 ENCOUNTER — Other Ambulatory Visit: Payer: Self-pay | Admitting: Internal Medicine

## 2024-04-18 DIAGNOSIS — I471 Supraventricular tachycardia, unspecified: Secondary | ICD-10-CM

## 2024-05-03 ENCOUNTER — Other Ambulatory Visit: Payer: Self-pay | Admitting: Internal Medicine

## 2024-05-03 MED ORDER — OXYCODONE HCL 5 MG PO TABS: 5.0000 mg | ORAL_TABLET | Freq: Three times a day (TID) | ORAL | 0 refills | Status: DC | PRN

## 2024-05-07 ENCOUNTER — Other Ambulatory Visit: Payer: Self-pay | Admitting: Internal Medicine

## 2024-05-07 DIAGNOSIS — I471 Supraventricular tachycardia, unspecified: Secondary | ICD-10-CM

## 2024-05-10 ENCOUNTER — Other Ambulatory Visit: Payer: Self-pay | Admitting: Internal Medicine

## 2024-05-11 ENCOUNTER — Other Ambulatory Visit: Payer: Self-pay | Admitting: Internal Medicine

## 2024-05-12 ENCOUNTER — Ambulatory Visit (HOSPITAL_BASED_OUTPATIENT_CLINIC_OR_DEPARTMENT_OTHER)
Admission: RE | Admit: 2024-05-12 | Discharge: 2024-05-12 | Disposition: A | Discharge status: Home / Self Care | Source: Ambulatory Visit | Attending: Internal Medicine | Admitting: Internal Medicine

## 2024-05-12 DIAGNOSIS — R053 Chronic cough: Secondary | ICD-10-CM | POA: Insufficient documentation

## 2024-05-12 DIAGNOSIS — J449 Chronic obstructive pulmonary disease, unspecified: Secondary | ICD-10-CM | POA: Diagnosis not present

## 2024-05-12 DIAGNOSIS — R0989 Other specified symptoms and signs involving the circulatory and respiratory systems: Secondary | ICD-10-CM | POA: Insufficient documentation

## 2024-05-12 DIAGNOSIS — Z8709 Personal history of other diseases of the respiratory system: Secondary | ICD-10-CM | POA: Insufficient documentation

## 2024-05-12 DIAGNOSIS — K224 Dyskinesia of esophagus: Secondary | ICD-10-CM | POA: Diagnosis not present

## 2024-05-21 ENCOUNTER — Other Ambulatory Visit: Payer: Self-pay | Admitting: Internal Medicine

## 2024-05-21 ENCOUNTER — Encounter: Payer: Self-pay | Admitting: Internal Medicine

## 2024-05-23 ENCOUNTER — Encounter: Payer: Self-pay | Admitting: Internal Medicine

## 2024-05-28 ENCOUNTER — Other Ambulatory Visit: Payer: Self-pay | Admitting: Internal Medicine

## 2024-06-01 ENCOUNTER — Ambulatory Visit: Admitting: Internal Medicine

## 2024-06-01 ENCOUNTER — Other Ambulatory Visit: Payer: Self-pay | Admitting: Internal Medicine

## 2024-06-01 ENCOUNTER — Encounter: Payer: Self-pay | Admitting: Internal Medicine

## 2024-06-01 VITALS — BP 140/76 | HR 99 | Temp 98.6°F | Resp 18 | Ht 67.0 in | Wt 163.0 lb

## 2024-06-01 DIAGNOSIS — E039 Hypothyroidism, unspecified: Secondary | ICD-10-CM

## 2024-06-01 DIAGNOSIS — G894 Chronic pain syndrome: Secondary | ICD-10-CM

## 2024-06-01 DIAGNOSIS — I10 Essential (primary) hypertension: Secondary | ICD-10-CM | POA: Diagnosis not present

## 2024-06-01 DIAGNOSIS — E7800 Pure hypercholesterolemia, unspecified: Secondary | ICD-10-CM | POA: Diagnosis not present

## 2024-06-01 MED ORDER — LEVOTHYROXINE SODIUM 50 MCG PO TABS: 50.0000 ug | ORAL_TABLET | Freq: Every day | ORAL | 1 refills | Status: DC

## 2024-06-01 MED ORDER — XTAMPZA ER 9 MG PO C12A: 1.0000 | EXTENDED_RELEASE_CAPSULE | Freq: Two times a day (BID) | ORAL | 0 refills | Status: DC

## 2024-06-01 NOTE — Assessment & Plan Note (Signed)
 She wishes to switch back to xtampza  9mg  BID.  This helps her pain better.  We did check her [place] CSR/PDMP.

## 2024-06-01 NOTE — Assessment & Plan Note (Signed)
 She remains on cholesteerol meds with atorvastatin  20mg  daily.  We will check her FLP today.

## 2024-06-01 NOTE — Assessment & Plan Note (Signed)
 We will recheck her TFT's at this time.  She denies any symptoms and seems to be euthyroid.

## 2024-06-01 NOTE — Progress Notes (Signed)
 Office Visit  Subjective   Patient ID: [name]   DOB: [birth year]   Age: 83 y.o.   MRN: [id number]   Chief Complaint Chief Complaint  Patient presents with   Follow-up    51m f/u. The patient is asking about her Levothyroxine , due to the medication being cancelled.     History of Present Illness The patient is a 83 year old Caucasian/White female who presents for a follow-up evaluation of hypertension. Since her last visit, she has not had any problems.  The patient has been checking her blood pressure at home.  She states her systolic BP is running in the [id number]'A at home.  The patient's current medications include: Lasix  80mg  every other day, spironolactone  25mg  daily.  She was on lisinopril  2.5mg  daily but in 02/2022 her cardiologist stopped this due to low BP.  She gave her amlodipine  2.5mg  po daily as needed for BP >160. The patient has been tolerating her medications well. The patient denies any headache, visual changes, chest pain, shortness of breath, orthopnea, and weakness/numbness. She reports there have been no other symptoms noted.   [name] returns today for routine followup on her cholesterol.  Overall, she states she is doing well and is without any complaints or problems at this time. She specifically denies abdominal pain, nausea, vomiting, diarrhea, myalgias, and fatigue. She remains on dietary management alone.  She was on pravastatin 80mg  po daily but this fell off her MAR sometime in 02/2014.  She is not sure why she stopped this medication.  She remains on lipitor 20mg  daily.  She is fasting in anticipation for labs today.    The patient is a 83 year old Caucasian/White female who returns for a regularly scheduled thyroid  check.  She did go for a thyroid  US  on 12/04/2014 and this showed an enlarged thyroid  with hetergenous thyroid  gland consistent with goit but no nodules were identified.  Since the last thyroid  check, there has been no overall change in  her status. She was on levothyroxine  oral tablet 50 mcg daily.   She claims to have no symptoms suggestive of thyroid  imbalance specifically denying fatigue, cold intolerance, heat intolerance, tremors, anxiety, unexplained weight changes, and dry skin, palpitations, diarrhea, constipation or other problems.   [name] is a 83 yo female who returns today for followup of her chronic pain syndrome.  The patient states that her oxycodone  does not control her pain like the Xtampza  did.  She is on oxycodone  5mg  po q 8 hrs prn which she takes every 8 hours.   She was on Xtampza  ER 9mg  BID and we therefore wrote her for oxycodone  5mg  po q 8 hrs prn where she tells me she is taking it 3 times per day.  Today, she states there is no change since her last visit 3 months, despite changing her pain meds.  This past year, she had worsening pain of her chronic right knee pain.  She did see Dr. Murrel Arnt in ortho in 01/2023 where he noted she has had persistent knee patellar pain since her right knee arthroplasty done in [place] 2007 by Dr.Feder. She had 1 single injection of cortisone in 09/2022 that gave her some relief and Dr. Lucienne Ryder discussed with her that repetitive cortisone injections are not a good idea with risks of infection.   X-rays have shown persistent lateral osteophyte or lateral patellofemoral ligament calcification with small crack which looks like a small avulsion.  She was told about this  up to 4 years ago and it has been persistent.  She has had knee aspiration in the past which showed no evidence of infection.  Bone scan showed slight increased uptake around the patella.  Serial x-rays showed no evidence of patellar component loosening.  Her pain seems to be worse when she is on her knee more.  Dr. Murrel Arnt discussed with her that he thinks that a patellar revision will unlikely give her pain relief.  This component appears to be well-fixed and he wants her to use a short knee immobilizer that she can wear  intermittently to take some pressure off the knee.  She has previously tried a copper sleeve without resolution.  She has chronic pain due to low back pain as well as pain from her history of bilateral knee replacements and bilateral shoulders.   She is currently on Xtampza  ER 9mg  po TID but she uses this twice a day.  This seems to help control her pain.  We noted she had acute pain in 2022 where she was having worsening pain with weakness and fatigue.  I thought these symptoms were a combination from recovering from COVID-19 and depression.  She did improve at that time.  Due to her acute pain as described , I spoke to Dr. Meredeth Stallion in Pain Management around 08/2021 where we decided to hold her Xtampza  for a few weeks and put her on short acting oxycodone  5mg  po q 6 hrs prn.  I did restart her on celexa  and her depression improved at that time.  [name] surgery on 01/2020.  She was on gabapentin  200mg  BID for pain as well but is no longer on this. Her chronic pain effects her neck, shoulders, lower back, hips and her knees.  She has seen Dr. Meredeth Stallion in pain management who recommended placing her on oxycodone  ER 10mg  po BID.  The patient could not afford this so we switched her to oxycodone  10mg  po q 6 hr prn which she does take 4 times per day.  She states she could not function with her day to day activites without her pain medications.  She has been on MS contin  and oxycodone  ER in the past.  She tells me that this medication gives her fatigue and all she wants to do is sit down and do nothing and is not able to do alot of her ADL's.  She was previously on percocet 10/325mg  po q 6hr prn (which she took 4 times per day) before the MS contin  and was on this for years.  She was on long term benzos in the past but again we stopped her off of these.  The patient has seen seen Dr. Laurier Poplin (orthopedics) in [place] and Dr. Lamon Pillow (neurosurgery).  She remains on cyclobenzaprine  as a  muscle relaxant.  The patient had had left shoulder surgery in 2014 due to impingement syndrome and a chronic rotator cuff tear and labral tear where they performed a debridement and synvectomy.  This did not correct her shoulder but did reduce the amount of pain she has had in that arm/shoulder.  Her back pain has remained the same.  She also has chronic back pain as well as pain from her history of bilateral knee replacements and bilateral shoulders.   She continues to have pain in both of her knees when she gets up.  She is still using a cane and no longer uses a walker for longer distances.  The  patient denies any falls.  She does use a cane to get around and has L[employer].  In regards to her chronic lower back pain, her pain mostly affects her when she stands up.  The pain is an intermittent dull aching she rates as severe at times but again improves with sitting and medications.  There is no pain radiation.  She had back surgery 2012 where her neurosurgeon noted she had worsening back pain with progressive break down of L1-L2 above the levels of her L2-L5 fusion in the past.  They did do surgery and removed some of her hardware.  She has tried PT and pool therapy which she states did not help.  Her pain medicine allows her to do her ADL's including cleaning her house and shopping.  The patient was tried on cymbalta in the past but this made her having nausea and vomiting.  There is no loss of bowel/bladder function or new weakness or numbness.      Past Medical History Past Medical History:  Diagnosis Date   Anemia    bld. transfusion post lumbar surgery- 2012   Anxiety    Arthralgia    NOS   CAD (coronary artery disease)    s/p CABG 2004; s/p DES to LM in 2010;  LHC 10/29/11: EF 50-55%, mild elevated filling pressures, no pulmonary HTN, LM 90% ISR, LAD and CFX occluded, S-RI occluded (old), S-OM3 ok and L-LAD ok, native nondominant RCA 95% -  med rx recommended ; Lexiscan  Myoview  7/13 at [facility]: demonstrated normal LV function, anterior attenuation and localized ischemia, inferior, basilar, mid section   Carotid artery disease (HCC)    Carotid US  6/19:  RICA 1-39; LICA 40-59; R subclavian stenosis - Repeat 1 year. // Carotid US  05/2019: R 1-39; L 40-59; R vertebral with atypical antegrade flow; R subclavian stenosis >> repeat 1 year // Carotid US  7/21: Bilat 40-59; L subclavian stenosis // Carotid US  7/22: Bilat ICA 40-59; R subclavian stenosis // Carotid US  08/10/23: RICA 1-39, LICA 40-59, L subclavian stenosis   CHF (congestive heart failure) (HCC) 01/21/2020   Chronic diastolic heart failure (HCC)    Echo 9/10: EF 60-55%, grade 1 diastolic dysfunction   Chronic lower back pain    Chronic respiratory failure (HCC)    Cirrhosis (HCC)    CKD (chronic kidney disease), stage III (HCC)    COPD (chronic obstructive pulmonary disease) (HCC)    Emphysema dxed by Dr. Darien Eden in [place] based on PFTs per pt in 2006; placed on albuterol    Degenerative disc disease, lumbar    Depression    TAKES CELEXA   AND  (OFF- WELBUTRIN)   Diuretic-induced hypokalemia 10/08/2018   DVT of lower extremity (deep venous thrombosis) (HCC)    recurrent. bilateral (2 episodes)   Dyspnea    home o2 when needed   Dysrhythmia    afib with cabg   Exertional angina (HCC)    Treated with Isosorbide , Ranexa , amlodipine ; intolerant to metoprolol    GERD (gastroesophageal reflux disease)    Gout    on daily RX (10/06/2018)   HLD (hyperlipidemia)    Hyperlipidemia    Hypertension    Hypothyroidism    L Subclavian Artery Stenosis    Carotid US  08/10/23: RICA 1-39, LICA 40-59, L subclavian stenosis   Obesity (BMI 30-39.9) 2009   BMI 33   On home oxygen  therapy    OSA (obstructive sleep apnea)    Osteoarthritis    all over; hands (10/06/2018)  Osteoporosis    Oxygen  dependent    4L at home as needed (10/06/2018)   PVD (peripheral vascular disease) (HCC)    Restless leg syndrome    Vitamin D   deficiency      Allergies Allergies  Allergen Reactions   Ativan  [Lorazepam ] Other (See Comments)    Confusion    Pitavastatin Itching and Other (See Comments)    Reaction to Livalo   Ropinirole Nausea Only and Other (See Comments)    Makes legs jump, also   Zofran  [Ondansetron  Hcl] Nausea Only   Zolpidem Tartrate Anxiety and Other (See Comments)    CONFUSION    Zolpidem Tartrate Swelling    CONFUSION   Penicillins Rash and Other (See Comments)    Tolerated cephalosporins in past Has patient had a PCN reaction causing immediate rash, facial/tongue/throat swelling, SOB or lightheadedness with hypotension: Yes Has patient had a PCN reaction causing severe rash involving mucus membranes or skin necrosis: No Has patient had a PCN reaction that required hospitalization: No Has patient had a PCN reaction occurring within the last 10 years: No If all of the above answers are NO, then may proceed with Cephalosporin use.      Medications  Current Outpatient Medications:    albuterol  (PROVENTIL ) (2.5 MG/3ML) 0.083% nebulizer solution, Take 2.5 mg by nebulization every 6 (six) hours as needed for wheezing or shortness of breath., Disp: , Rfl:    allopurinol  (ZYLOPRIM ) 100 MG tablet, TAKE 1 TABLET BY MOUTH DAILY WITH BREAKFAST., Disp: 90 tablet, Rfl: 0   alum & mag hydroxide-simeth (MAALOX MAX) 400-400-40 MG/5ML suspension, Take 15 mLs by mouth every 6 (six) hours as needed for indigestion., Disp: 355 mL, Rfl: 0   amLODipine  (NORVASC ) 2.5 MG tablet, Take 1/2 tablet as needed for BP greater than 160., Disp: 30 tablet, Rfl: 1   ARIPiprazole  (ABILIFY ) 2 MG tablet, TAKE 1 TABLET BY MOUTH ONCE DAILY, Disp: 90 tablet, Rfl: 0   aspirin  EC 81 MG tablet, Take 81 mg by mouth daily with breakfast. Swallow whole., Disp: , Rfl:    atorvastatin  (LIPITOR) 20 MG tablet, TAKE 1 TABLET BY MOUTH AT BEDTIME, Disp: 90 tablet, Rfl: 0   cholecalciferol  (VITAMIN D3) 25 MCG (1000 UNIT) tablet, Take 1 tablet (1,000  Units total) by mouth daily with supper., Disp: 90 tablet, Rfl: 1   citalopram  (CELEXA ) 10 MG tablet, TAKE 1 TABLET BY MOUTH DAILY., Disp: 90 tablet, Rfl: 0   cyclobenzaprine  (FLEXERIL ) 10 MG tablet, Take 1 tablet (10 mg total) by mouth 3 (three) times daily., Disp: 270 tablet, Rfl: 0   D3-1000 25 MCG (1000 UT) capsule, TAKE 1 CAPSULE BY MOUTH DAILY WITH SUPPER., Disp: 90 capsule, Rfl: 0   dapagliflozin  propanediol (FARXIGA ) 10 MG TABS tablet, Take 1 tablet (10 mg total) by mouth daily before breakfast., Disp: 90 tablet, Rfl: 0   Docusate Sodium  (DSS) 100 MG CAPS, Take 100 mg by mouth daily as needed (constipation)., Disp: , Rfl:    donepezil  (ARICEPT ) 10 MG tablet, TAKE 1 TABLET BY MOUTH AT BEDTIME., Disp: 30 tablet, Rfl: 0   feeding supplement (ENSURE ENLIVE / ENSURE PLUS) LIQD, Take 237 mLs by mouth 2 (two) times daily between meals., Disp: , Rfl:    ferrous sulfate  325 (65 FE) MG tablet, TAKE 1 TABLET BY MOUTH 2 TIMES DAILY, Disp: 180 tablet, Rfl: 0   Fluticasone -Umeclidin-Vilant (TRELEGY ELLIPTA ) 200-62.5-25 MCG/ACT AEPB, Inhale 1 puff into the lungs daily., Disp: 1 each, Rfl: 5   Fluticasone -Umeclidin-Vilant (TRELEGY ELLIPTA )  200-62.5-25 MCG/ACT AEPB, Inhale 200 mg into the lungs daily., Disp: , Rfl:    furosemide  (LASIX ) 80 MG tablet, TAKE 1 TABLET BY MOUTH DAILY., Disp: 90 tablet, Rfl: 3   gabapentin  (NEURONTIN ) 100 MG capsule, TAKE 2 CAPSULES BY MOUTH TWICE DAILY, Disp: 360 capsule, Rfl: 1   Ipratropium-Albuterol  (COMBIVENT  RESPIMAT) 20-100 MCG/ACT AERS respimat, Inhale 1 puff into the lungs 3 (three) times daily as needed for wheezing or shortness of breath., Disp: , Rfl:    Magnesium  Oxide -Mg Supplement 500 MG CAPS, Take 1 capsule (500 mg total) by mouth daily., Disp: 90 capsule, Rfl: 0   Magnesium  Oxide -Mg Supplement 500 MG CAPS, TAKE 1 CAPSULE BY MOUTH DAILY, Disp: 90 capsule, Rfl: 0   Melatonin 10 MG TABS, Take 10 mg by mouth at bedtime., Disp: , Rfl:    memantine  (NAMENDA ) 5 MG  tablet, TAKE 1 TABLET BY MOUTH 2 TIMES DAILY., Disp: 60 tablet, Rfl: 0   metoprolol  tartrate (LOPRESSOR ) 25 MG tablet, TAKE 1/2 TABLETS BY MOUTH TWO TIMES DAILY., Disp: 15 tablet, Rfl: 0   Multiple Vitamin (MULTIVITAMIN WITH MINERALS) TABS tablet, Take 1 tablet by mouth daily., Disp: , Rfl:    nitroGLYCERIN  (NITROSTAT ) 0.4 MG SL tablet, Place 1 tablet (0.4 mg total) under the tongue every 5 (five) minutes as needed for chest pain. For chest pain, Disp: 25 tablet, Rfl: 3   OXYGEN , Inhale 4 L/min into the lungs as needed (DURING ALL TIMES OF EXERTION). 3L at night when needed., Disp: , Rfl:    pantoprazole  (PROTONIX ) 40 MG tablet, Take 1 tablet (40 mg total) by mouth 2 (two) times daily., Disp: 60 tablet, Rfl: 0   pilocarpine  (PILOCAR) 1 % ophthalmic solution, Place 1 drop into both eyes 2 (two) times daily., Disp: , Rfl:    polyethylene glycol (MIRALAX  / GLYCOLAX ) 17 g packet, Take 17 g by mouth as needed., Disp: , Rfl:    potassium chloride  SA (KLOR-CON  M) 20 MEQ tablet, TAKE 1 TABLET ONCE DAILY WITH FOOD, Disp: 90 tablet, Rfl: 0   pramipexole  (MIRAPEX ) 0.25 MG tablet, TAKE 1 TABLET 2-3 HOURS BEFORE BEDTIME, Disp: 90 tablet, Rfl: 0   roflumilast  (DALIRESP ) 500 MCG TABS tablet, TAKE 1 TABLET ONCE DAILY, Disp: 90 tablet, Rfl: 0   spironolactone  (ALDACTONE ) 25 MG tablet, TAKE 1 TABLET BY MOUTH DAILY., Disp: 90 tablet, Rfl: 0   traZODone  (DESYREL ) 50 MG tablet, TAKE 3 TABLETS EVERY NIGHT AT BEDTIME., Disp: 270 tablet, Rfl: 0   vitamin C (ASCORBIC ACID ) 500 MG tablet, Take 500 mg by mouth daily with lunch., Disp: , Rfl:    Review of Systems Review of Systems  Constitutional:  Negative for chills, fever and malaise/fatigue.  Eyes:  Negative for blurred vision and double vision.  Respiratory:  Positive for shortness of breath. Negative for cough.   Cardiovascular:  Negative for chest pain, palpitations and leg swelling.  Gastrointestinal:  Negative for abdominal pain, constipation, diarrhea, nausea and  vomiting.  Genitourinary:  Negative for frequency.  Musculoskeletal:  Positive for joint pain. Negative for myalgias.  Skin:  Positive for itching. Negative for rash.  Neurological:  Negative for dizziness, weakness and headaches.  Endo/Heme/Allergies:  Positive for polydipsia.       Objective:    Vitals BP (!) 140/76   Pulse 99   Temp 98.6 F (37 C) (Oral)   Resp 18   Ht 5' 7 (1.702 m)   Wt 163 lb (73.9 kg)   SpO2 93%   BMI  25.53 kg/m    Physical Examination Physical Exam Constitutional:      Appearance: Normal appearance. She is not ill-appearing.  Cardiovascular:     Rate and Rhythm: Normal rate and regular rhythm.     Pulses: Normal pulses.     Heart sounds: No murmur heard.    No friction rub. No gallop.  Pulmonary:     Effort: Pulmonary effort is normal. No respiratory distress.     Breath sounds: No wheezing, rhonchi or rales.  Abdominal:     General: Bowel sounds are normal. There is no distension.     Palpations: Abdomen is soft.     Tenderness: There is no abdominal tenderness.  Musculoskeletal:     Right lower leg: No edema.     Left lower leg: No edema.  Skin:    General: Skin is warm and dry.     Findings: No rash.  Neurological:     General: No focal deficit present.     Mental Status: She is alert and oriented to person, place, and time.  Psychiatric:        Mood and Affect: Mood normal.        Behavior: Behavior normal.        Assessment & Plan:   Essential hypertension Her BP is a bit borderline today.  We will see what her BP is doing on her next visit.  She is now taking lasix  as needed and not every other day.  Hypothyroidism We will recheck her TFT's at this time.  She denies any symptoms and seems to be euthyroid.  Hypercholesterolemia She remains on cholesteerol meds with atorvastatin  20mg  daily.  We will check her FLP today.  Chronic pain She wishes to switch back to xtampza  9mg  BID.  This helps her pain better.  We did check  her [place] CSR/PDMP.    Return in about 3 months (around 09/01/2024).   Wayne Haines, MD

## 2024-06-01 NOTE — Assessment & Plan Note (Signed)
 Her BP is a bit borderline today.  We will see what her BP is doing on her next visit.  She is now taking lasix  as needed and not every other day.

## 2024-06-02 LAB — LIPID PANEL
Chol/HDL Ratio: 2.3 ratio (ref 0.0–4.4)
Cholesterol, Total: 120 mg/dL (ref 100–199)
HDL: 53 mg/dL (ref >39)
LDL Chol Calc (NIH): 49 mg/dL (ref 0–99)
Triglycerides: 94 mg/dL (ref 0–149)
VLDL Cholesterol Cal: 18 mg/dL (ref 5–40)

## 2024-06-02 LAB — T4, FREE: Free T4: 1.09 ng/dL (ref 0.82–1.77)

## 2024-06-02 LAB — TSH: TSH: 4.05 u[IU]/mL (ref 0.450–4.500)

## 2024-06-03 ENCOUNTER — Other Ambulatory Visit: Payer: Self-pay | Admitting: Internal Medicine

## 2024-06-03 MED ORDER — OXYCODONE HCL 5 MG PO TABS: 5.0000 mg | ORAL_TABLET | Freq: Three times a day (TID) | ORAL | 0 refills | Status: DC | PRN

## 2024-06-06 ENCOUNTER — Ambulatory Visit: Admitting: Nurse Practitioner

## 2024-06-06 ENCOUNTER — Encounter: Payer: Self-pay | Admitting: Nurse Practitioner

## 2024-06-09 ENCOUNTER — Ambulatory Visit: Payer: Self-pay

## 2024-06-09 NOTE — Progress Notes (Signed)
 Patient called.  Unable to reach patient.  I have called and was unable to leave a voicemail. The patient needs to be informed Her thyroid  and cholesterol tests look good. [name]

## 2024-06-14 NOTE — Progress Notes (Signed)
 Patient called.  Unable to reach patient.  I have called and was unable to leave a voicemail. The patient needs to be informed Her thyroid  and cholesterol tests look good. Aaron Aas

## 2024-06-15 NOTE — Progress Notes (Signed)
 Patient called.  Unable to reach patient.  I have called and was unable to leave a voicemail. The patient needs to be informed Her thyroid  and cholesterol tests look good. .  Letter sent.

## 2024-06-28 ENCOUNTER — Other Ambulatory Visit: Payer: Self-pay | Admitting: Internal Medicine

## 2024-06-28 DIAGNOSIS — I471 Supraventricular tachycardia, unspecified: Secondary | ICD-10-CM

## 2024-06-29 ENCOUNTER — Other Ambulatory Visit: Payer: Self-pay

## 2024-06-29 DIAGNOSIS — I471 Supraventricular tachycardia, unspecified: Secondary | ICD-10-CM

## 2024-06-29 MED ORDER — METOPROLOL TARTRATE 25 MG PO TABS: 12.5000 mg | ORAL_TABLET | Freq: Two times a day (BID) | ORAL | 0 refills | Status: DC

## 2024-06-29 NOTE — Progress Notes (Signed)
 Rx Refill

## 2024-06-30 ENCOUNTER — Other Ambulatory Visit: Payer: Self-pay | Admitting: Internal Medicine

## 2024-06-30 MED ORDER — OXYCODONE HCL 5 MG PO TABS: 5.0000 mg | ORAL_TABLET | Freq: Three times a day (TID) | ORAL | 0 refills | Status: DC | PRN

## 2024-07-06 ENCOUNTER — Other Ambulatory Visit: Payer: Self-pay | Admitting: Internal Medicine

## 2024-07-11 ENCOUNTER — Other Ambulatory Visit: Payer: Self-pay | Admitting: *Deleted

## 2024-07-11 DIAGNOSIS — I6523 Occlusion and stenosis of bilateral carotid arteries: Secondary | ICD-10-CM

## 2024-07-21 ENCOUNTER — Telehealth: Payer: Self-pay | Admitting: Internal Medicine

## 2024-07-21 NOTE — Telephone Encounter (Signed)
 Attempted to call Stacy from [facility], did not receive an answer.

## 2024-07-21 NOTE — Telephone Encounter (Signed)
 Calling because the patient has a dx code for afib but the patient is not on medication for that. Please advise

## 2024-08-08 ENCOUNTER — Other Ambulatory Visit: Payer: Self-pay | Admitting: Internal Medicine

## 2024-08-08 DIAGNOSIS — I471 Supraventricular tachycardia, unspecified: Secondary | ICD-10-CM

## 2024-08-10 ENCOUNTER — Other Ambulatory Visit: Payer: Self-pay

## 2024-08-10 ENCOUNTER — Other Ambulatory Visit: Payer: Self-pay | Admitting: Internal Medicine

## 2024-08-10 MED ORDER — CYCLOBENZAPRINE HCL 10 MG PO TABS: 10.0000 mg | ORAL_TABLET | Freq: Three times a day (TID) | ORAL | 0 refills | Status: AC

## 2024-08-10 MED ORDER — OXYCODONE HCL 5 MG PO TABS: 5.0000 mg | ORAL_TABLET | Freq: Three times a day (TID) | ORAL | 0 refills | Status: DC | PRN

## 2024-08-17 NOTE — Progress Notes (Unsigned)
 Cardiology Office Note   Date:  08/18/2024   ID:  [name], [place] Feb 28, 1941, MRN [id number]  PCP:  Judy Valeria Mayo, MD  Cardiologist:   Vina Gull, MD   Pt presents for f/u of CAD    History of Present Illness: [name] is a 83 y.o. female with a history of CAD (s/p CABG 2004; DES to LM in 2010; myovue in 2018:  Low risk with distal anterior and apical ischemia), chronic diastolic CHF, CV dz, PAD recurrent DVT/PE, GI bleeding  when on anticoagulation (not on), severe  COPD, PAF (after CABG), GERD, HL, HTN, renal insufficiency, sleep apnea (not on CPAP)    Over the years she has been admitted for pneumonia, COPD exacerbations and decompensated HFpEF  I last saw the pt in October 2023  The pt wore an event monitor after that visit   This showed the pt was in atrial flutter 100% of time   Not on anticoagulation due to bleeding risks (GI/AVMs) Not a good candidate for Watchman  I saw the pt in March 2024 Feb 2025 was  in ER with influenza   \Since seen she denies  CP   Breathing is steady  Sometime SOB     Has O2 at home  Insurance won't cover small O2    Occasional dizziness  with standing    Current Meds  Medication Sig   albuterol  (PROVENTIL ) (2.5 MG/3ML) 0.083% nebulizer solution Take 2.5 mg by nebulization every 6 (six) hours as needed for wheezing or shortness of breath.   allopurinol  (ZYLOPRIM ) 100 MG tablet TAKE 1 TABLET BY MOUTH DAILY WITH BREAKFAST.   alum & mag hydroxide-simeth (MAALOX MAX) 400-400-40 MG/5ML suspension Take 15 mLs by mouth every 6 (six) hours as needed for indigestion.   amLODipine  (NORVASC ) 2.5 MG tablet Take 1/2 tablet as needed for BP greater than 160.   ARIPiprazole  (ABILIFY ) 2 MG tablet TAKE 1 TABLET BY MOUTH ONCE DAILY   aspirin  EC 81 MG tablet Take 81 mg by mouth daily with breakfast. Swallow whole.   atorvastatin  (LIPITOR) 20 MG tablet TAKE 1 TABLET BY MOUTH AT BEDTIME   cholecalciferol  (VITAMIN D3) 25 MCG (1000 UNIT) tablet  Take 1 tablet (1,000 Units total) by mouth daily with supper.   citalopram  (CELEXA ) 10 MG tablet TAKE 1 TABLET BY MOUTH DAILY.   cyclobenzaprine  (FLEXERIL ) 10 MG tablet Take 1 tablet (10 mg total) by mouth 3 (three) times daily.   D3-1000 25 MCG (1000 UT) capsule TAKE 1 CAPSULE BY MOUTH DAILY WITH SUPPER.   dapagliflozin  propanediol (FARXIGA ) 10 MG TABS tablet TAKE 1 TABLET BY MOUTH DAILY BEFORE BREAKFAST.   Docusate Sodium  (DSS) 100 MG CAPS Take 100 mg by mouth daily as needed (constipation).   donepezil  (ARICEPT ) 10 MG tablet TAKE 1 TABLET BY MOUTH AT BEDTIME.   feeding supplement (ENSURE ENLIVE / ENSURE PLUS) LIQD Take 237 mLs by mouth 2 (two) times daily between meals.   ferrous sulfate  325 (65 FE) MG tablet TAKE 1 TABLET BY MOUTH 2 TIMES DAILY   Fluticasone -Umeclidin-Vilant (TRELEGY ELLIPTA ) 200-62.5-25 MCG/ACT AEPB Inhale 1 puff into the lungs daily.   Fluticasone -Umeclidin-Vilant (TRELEGY ELLIPTA ) 200-62.5-25 MCG/ACT AEPB Inhale 200 mg into the lungs daily.   furosemide  (LASIX ) 80 MG tablet TAKE 1 TABLET BY MOUTH DAILY.   gabapentin  (NEURONTIN ) 100 MG capsule TAKE 2 CAPSULES BY MOUTH TWICE DAILY   Ipratropium-Albuterol  (COMBIVENT  RESPIMAT) 20-100 MCG/ACT AERS respimat Inhale 1 puff into the lungs 3 (three) times daily  as needed for wheezing or shortness of breath.   levothyroxine  (SYNTHROID ) 50 MCG tablet Take 1 tablet (50 mcg total) by mouth daily before breakfast.   Magnesium  Oxide -Mg Supplement 500 MG CAPS Take 1 capsule (500 mg total) by mouth daily.   Magnesium  Oxide -Mg Supplement 500 MG CAPS TAKE 1 CAPSULE BY MOUTH DAILY   Melatonin 10 MG TABS Take 10 mg by mouth at bedtime.   memantine  (NAMENDA ) 5 MG tablet TAKE 1 TABLET BY MOUTH 2 TIMES DAILY.   metoprolol  tartrate (LOPRESSOR ) 25 MG tablet TAKE 1/2 TABLET BY MOUTH 2 TIMES DAILY.   Multiple Vitamin (MULTIVITAMIN WITH MINERALS) TABS tablet Take 1 tablet by mouth daily.   nitroGLYCERIN  (NITROSTAT ) 0.4 MG SL tablet Place 1 tablet  (0.4 mg total) under the tongue every 5 (five) minutes as needed for chest pain. For chest pain   oxyCODONE  (OXY IR/ROXICODONE ) 5 MG immediate release tablet Take 1 tablet (5 mg total) by mouth every 8 (eight) hours as needed for severe pain (pain score 7-10).   OXYGEN  Inhale 4 L/min into the lungs as needed (DURING ALL TIMES OF EXERTION). 3L at night when needed.   pantoprazole  (PROTONIX ) 40 MG tablet TAKE 1 TABLET BY MOUTH DAILY.   pilocarpine  (PILOCAR) 1 % ophthalmic solution Place 1 drop into both eyes 2 (two) times daily.   polyethylene glycol (MIRALAX  / GLYCOLAX ) 17 g packet Take 17 g by mouth as needed.   potassium chloride  SA (KLOR-CON  M) 20 MEQ tablet TAKE 1 TABLET ONCE DAILY WITH FOOD   pramipexole  (MIRAPEX ) 0.25 MG tablet TAKE 1 TABLET 2-3 HOURS BEFORE BEDTIME   roflumilast  (DALIRESP ) 500 MCG TABS tablet TAKE 1 TABLET ONCE DAILY   spironolactone  (ALDACTONE ) 25 MG tablet TAKE 1 TABLET BY MOUTH ONCE DAILY   traZODone  (DESYREL ) 50 MG tablet TAKE 3 TABLETS EVERY NIGHT AT BEDTIME.   vitamin C (ASCORBIC ACID ) 500 MG tablet Take 500 mg by mouth daily with lunch.     Allergies:   Ativan  [lorazepam ], Pitavastatin, Ropinirole, Zofran  [ondansetron  hcl], Zolpidem tartrate, Zolpidem tartrate, and Penicillins   Past Medical History:  Diagnosis Date   Anemia    bld. transfusion post lumbar surgery- 2012   Anxiety    Arthralgia    NOS   CAD (coronary artery disease)    s/p CABG 2004; s/p DES to LM in 2010;  LHC 10/29/11: EF 50-55%, mild elevated filling pressures, no pulmonary HTN, LM 90% ISR, LAD and CFX occluded, S-RI occluded (old), S-OM3 ok and L-LAD ok, native nondominant RCA 95% -  med rx recommended ; Lexiscan  [facility]  7/13 [facility]: demonstrated normal LV function, anterior attenuation and localized ischemia, inferior, basilar, mid section   Carotid artery disease (HCC)    Carotid US  6/19:  RICA 1-39; LICA 40-59; R subclavian stenosis - Repeat 1 year. // Carotid US  05/2019: R  1-39; L 40-59; R vertebral with atypical antegrade flow; R subclavian stenosis >> repeat 1 year // Carotid US  7/21: Bilat 40-59; L subclavian stenosis // Carotid US  7/22: Bilat ICA 40-59; R subclavian stenosis // Carotid US  08/10/23: RICA 1-39, LICA 40-59, L subclavian stenosis   CHF (congestive heart failure) (HCC) 01/21/2020   Chronic diastolic heart failure (HCC)    Echo 9/10: EF 60-55%, grade 1 diastolic dysfunction   Chronic lower back pain    Chronic respiratory failure (HCC)    Cirrhosis (HCC)    CKD (chronic kidney disease), stage III (HCC)    COPD (chronic obstructive pulmonary disease) (HCC)  Emphysema dxed by Dr. Rosamond in [place] based on PFTs per pt in 2006; placed on albuterol    Degenerative disc disease, lumbar    Depression    TAKES CELEXA   AND  (OFF- WELBUTRIN)   Diuretic-induced hypokalemia 10/08/2018   DVT of lower extremity (deep venous thrombosis) (HCC)    recurrent. bilateral (2 episodes)   Dyspnea    home o2 when needed   Dysrhythmia    afib with cabg   Exertional angina (HCC)    Treated with Isosorbide , Ranexa , amlodipine ; intolerant to metoprolol    GERD (gastroesophageal reflux disease)    Gout    on daily RX (10/06/2018)   HLD (hyperlipidemia)    Hyperlipidemia    Hypertension    Hypothyroidism    L Subclavian Artery Stenosis    Carotid US  08/10/23: RICA 1-39, LICA 40-59, L subclavian stenosis   Obesity (BMI 30-39.9) 2009   BMI 33   On home oxygen  therapy    OSA (obstructive sleep apnea)    Osteoarthritis    all over; hands (10/06/2018)   Osteoporosis    Oxygen  dependent    4L at home as needed (10/06/2018)   PVD (peripheral vascular disease) (HCC)    Restless leg syndrome    Vitamin D  deficiency     Past Surgical History:  Procedure Laterality Date   ANKLE SURGERY  2004   ANTERIOR CERVICAL DECOMP/DISCECTOMY FUSION  11/2007   AUGMENTATION MAMMAPLASTY     BACK SURGERY  1986   lower; another scheduled, opt. reports 4 back- lumbar, 3 cerv.  fusions  for later 2009   CARDIAC CATHETERIZATION  11/2009   Patent LIMA to LAD and patent SVG to OM1. Occluded SVG to ramus and diagonal. Left main: 90% ostial stenosis, LCX 60-70% proximal stenosis   CATARACT EXTRACTION W/ INTRAOCULAR LENS  IMPLANT, BILATERAL Bilateral 2006   COLONOSCOPY WITH PROPOFOL  N/A 02/17/2017   Procedure: COLONOSCOPY WITH PROPOFOL ;  Surgeon: Gordy CHRISTELLA Starch, MD;  Location: [facility] ENDOSCOPY;  Service: Endoscopy;  Laterality: N/A;   CORONARY ANGIOPLASTY WITH STENT PLACEMENT  11/2009   Drug eluting stent to left main artery: 4.0 X 12 mm Ion    CORONARY ARTERY BYPASS GRAFT  2004   CABG X4   ESOPHAGOGASTRODUODENOSCOPY N/A 02/14/2017   Procedure: ESOPHAGOGASTRODUODENOSCOPY (EGD);  Surgeon: Victory LITTIE Legrand DOUGLAS, MD;  Location: [facility] ENDOSCOPY;  Service: Endoscopy;  Laterality: N/A;   GIVENS CAPSULE STUDY N/A 02/15/2017   Procedure: GIVENS CAPSULE STUDY;  Surgeon: Victory LITTIE Legrand DOUGLAS, MD;  Location: [facility] ENDOSCOPY;  Service: Endoscopy;  Laterality: N/A;   GROIN MASS OPEN BIOPSY  2004   IR IVC FILTER PLMT / S&I /IMG GUID/MOD SED  03/14/2018   IR PARACENTESIS  03/10/2018   JOINT REPLACEMENT     LAPAROSCOPIC CHOLECYSTECTOMY     PERIPHERAL VASCULAR INTERVENTION Left 03/05/2018   Procedure: PERIPHERAL VASCULAR INTERVENTION;  Surgeon: Harvey Carlin BRAVO, MD;  Location: [facility] INVASIVE CV LAB;  Service: Cardiovascular;  Laterality: Left;  Attempted unsuccess\ful Per Dr. Delmar   PERIPHERAL VASCULAR INTERVENTION  03/08/2018   Procedure: PERIPHERAL VASCULAR INTERVENTION;  Surgeon: Sheree Penne Bruckner, MD;  Location: [facility] INVASIVE CV LAB;  Service: Cardiovascular;;  SMA Stent    REPLACEMENT TOTAL KNEE BILATERAL Bilateral 2012   REVERSE SHOULDER ARTHROPLASTY  02/26/2012   Procedure: REVERSE SHOULDER ARTHROPLASTY;  Surgeon: Franky CHRISTELLA Pointer, MD;  Location: [facility] OR;  Service: Orthopedics;  Laterality: Right;  RIGHT SHOULDER REVERSED ARTHROPLASTY   SHOULDER ARTHROSCOPY WITH SUBACROMIAL DECOMPRESSION Left 02/10/2013  Procedure: SHOULDER ARTHROSCOPY WITH SUBACROMIAL DECOMPRESSION DISTAL CLAVICLE RESECTION;  Surgeon: Franky CHRISTELLA Pointer, MD;  Location: [facility] OR;  Service: Orthopedics;  Laterality: Left;  DISTAL CLAVICLE RESECTION   TONSILLECTOMY     TOTAL HIP ARTHROPLASTY  2007   Right   TOTAL HIP ARTHROPLASTY Left 02/07/2020   Procedure: TOTAL HIP ARTHROPLASTY ANTERIOR APPROACH;  Surgeon: Ernie Cough, MD;  Location: [facility] ORS;  Service: Orthopedics;  Laterality: Left;  70 mins   TRANSURETHRAL RESECTION OF BLADDER TUMOR N/A 05/31/2018   Procedure: TRANSURETHRAL RESECTION OF BLADDER TUMOR (TURBT);  Surgeon: Carolee Sherwood JONETTA DOUGLAS, MD;  Location: [facility] ORS;  Service: Urology;  Laterality: N/A;   VISCERAL ANGIOGRAPHY N/A 03/05/2018   Procedure: VISCERAL ANGIOGRAPHY;  Surgeon: Harvey Carlin BRAVO, MD;  Location: [facility] INVASIVE CV LAB;  Service: Cardiovascular;  Laterality: N/A;   VISCERAL ANGIOGRAPHY N/A 03/08/2018   Procedure: VISCERAL ANGIOGRAPHY;  Surgeon: Sheree Penne Bruckner, MD;  Location[facility] INVASIVE CV LAB;  Service: Cardiovascular;  Laterality: N/A;     Social History:  The patient  reports that she quit smoking about 25 years ago. Her smoking use included cigarettes. She started smoking about 70 years ago. She has a 45 pack-year smoking history. She has never used smokeless tobacco. She reports that she does not drink alcohol  and does not use drugs.   Family History:  The patient's family history includes COPD in her father; Colitis in her sister; Heart disease in her father and sister; Lung cancer in her father; Ovarian cancer in her mother; Stomach cancer in her sister.    ROS:  Please see the history of present illness. All other systems are reviewed and  Negative to the above problem except as noted.    PHYSICAL EXAM: VS:  BP (!) 158/70   Pulse 97   Ht 5' 7 (1.702 m)   Wt 166 lb (75.3 kg)   SpO2 (!) 86%   BMI 26.00 kg/m   GEN: Well nourished, in no acute distress   Neck: no obvious JVD, Cardiac:  Regularly irreg     Gr Ii/VI systolic murmur (early peaking ) at base  .  Respiratory:  moving air  GI: soft, nontender Ext  Has support socks on  No edema    EKG:  EKG shows SR with PVCs  97 bpm   RBBB  LAFB    Lipid Panel    Component Value Date/Time   CHOL 120 06/01/2024 0240   CHOL 98 05/23/2014 0857   TRIG 94 06/01/2024 0240   TRIG 83 05/23/2014 0857   HDL 53 06/01/2024 0240   HDL 35 (L) 05/23/2014 0857   CHOLHDL 2.3 06/01/2024 0240   CHOLHDL 1.7 05/10/2021 2305   VLDL 4 05/10/2021 2305   LDLCALC 49 06/01/2024 0240   LDLCALC 46 05/23/2014 0857      Wt Readings from Last 3 Encounters:  08/18/24 166 lb (75.3 kg)  06/01/24 163 lb (73.9 kg)  03/31/24 158 lb 9.6 oz (71.9 kg)      ASSESSMENT AND PLAN:  1  CAD   Remote CABG in 2004   DES to LM in 2010   Myoview  2018   Low risk Pt without symptoms of angina   She is not very active    Follow      2  Atrial fib/flutter   PT with hx of Afib after CABG    An event monitor in Oct 2023 showed 100% atrial flutter  With hx of GI bleeding in past (2019)  not a good candidate for anticoagluation Not a good candidate for Watchman   Pt in SR today   3  Hx HFpEF   Volume status is good   4  CV dz  CV dz remains mild Had USN today    5  Hx DVT / PE  2019  Multiple bilateral PEs, L femoral DVT  Anticoagulation d/c'd due to Hx GI bleeding, large abdomenal wall hematoma  Underwent IVC filter    6 HTN   BP is high today   Asked pt  to follow at home   Has amlodipine  prn   Take cuff and log to PCP   7  HL  LIpids excellent in June [id number]  LDDL 49  HDL 53  8  COPD   Severe   O2 86 on arrival  WIll recheck    Needs smaller portable O2 container to take with her    Follow up in spring   Current medicines are reviewed at length with the patient today.  The patient does not have concerns regarding medicines.  Signed, Vina Gull, MD  08/18/2024 10:53 AM    [facility] [address] [place], [place], [place]   [address] Phone: [phone]; Fax: [phone]

## 2024-08-18 ENCOUNTER — Encounter: Payer: Self-pay | Admitting: Internal Medicine

## 2024-08-18 ENCOUNTER — Ambulatory Visit
Admission: RE | Admit: 2024-08-18 | Discharge: 2024-08-18 | Disposition: A | Discharge status: Home / Self Care | Source: Ambulatory Visit | Attending: Vascular Surgery | Admitting: Vascular Surgery

## 2024-08-18 ENCOUNTER — Ambulatory Visit: Admitting: Internal Medicine

## 2024-08-18 VITALS — BP 158/70 | HR 97 | Ht 67.0 in | Wt 166.0 lb

## 2024-08-18 DIAGNOSIS — I4891 Unspecified atrial fibrillation: Secondary | ICD-10-CM

## 2024-08-18 DIAGNOSIS — I6523 Occlusion and stenosis of bilateral carotid arteries: Secondary | ICD-10-CM | POA: Diagnosis not present

## 2024-08-18 NOTE — Patient Instructions (Signed)
 Medication Instructions:  Your physician recommends that you continue on your current medications as directed. Please refer to the Current Medication list given to you today.  *If you need a refill on your cardiac medications before your next appointment, please call your pharmacy*  Follow-Up: At [facility], you and your health needs are our priority.  As part of our continuing mission to provide you with exceptional heart care, our providers are all part of one team.  This team includes your primary Cardiologist (physician) and Advanced Practice Providers or APPs (Physician Assistants and Nurse Practitioners) who all work together to provide you with the care you need, when you need it.  Your next appointment:   9 month(s)  Provider:   Vina Gull, MD   We recommend signing up for the patient portal called MyChart.  Sign up information is provided on this After Visit Summary.  MyChart is used to connect with patients for Virtual Visits (Telemedicine).  Patients are able to view lab/test results, encounter notes, upcoming appointments, etc.  Non-urgent messages can be sent to your provider as well.    To learn more about what you can do with MyChart, go to [email or web address].

## 2024-08-19 ENCOUNTER — Telehealth: Payer: Self-pay | Admitting: Internal Medicine

## 2024-08-19 MED ORDER — SPIRONOLACTONE 25 MG PO TABS: 25.0000 mg | ORAL_TABLET | Freq: Every day | ORAL | 3 refills | Status: AC

## 2024-08-19 MED ORDER — DAPAGLIFLOZIN PROPANEDIOL 10 MG PO TABS: 10.0000 mg | ORAL_TABLET | Freq: Every day | ORAL | 3 refills | Status: AC

## 2024-08-19 NOTE — Telephone Encounter (Signed)
*  STAT* If patient is at the pharmacy, call can be transferred to refill team.   1. Which medications need to be refilled? (please list name of each medication and dose if known)   spironolactone  (ALDACTONE ) 25 MG tablet  dapagliflozin  propanediol (FARXIGA ) 10 MG TABS tablet   2. Would you like to learn more about the convenience, safety, & potential cost savings by using the [facility]?   3. Are you open to using the [facility] (Type [facility]. ).  4. Which pharmacy/location (including street and city if local pharmacy) is medication to be sent to?  [facility] - [place], [place] - [address]   5. Do they need a 30 day or 90 day supply?   90 day  Caller Francie) stated patient will be out in less than a week.

## 2024-08-19 NOTE — Telephone Encounter (Signed)
 Pt's medications were sent to pt's pharmacy as requested. Confirmation received.

## 2024-08-23 ENCOUNTER — Ambulatory Visit: Payer: Self-pay | Admitting: Physician Assistant

## 2024-08-24 ENCOUNTER — Other Ambulatory Visit: Payer: Self-pay | Admitting: Internal Medicine

## 2024-08-28 ENCOUNTER — Ambulatory Visit: Payer: Self-pay | Admitting: Internal Medicine

## 2024-08-31 ENCOUNTER — Encounter: Payer: Self-pay | Admitting: Internal Medicine

## 2024-08-31 ENCOUNTER — Ambulatory Visit: Admitting: Internal Medicine

## 2024-08-31 VITALS — BP 152/62 | HR 66 | Temp 97.6°F | Resp 16 | Ht 68.0 in | Wt 166.2 lb

## 2024-08-31 DIAGNOSIS — I10 Essential (primary) hypertension: Secondary | ICD-10-CM

## 2024-08-31 DIAGNOSIS — Z23 Encounter for immunization: Secondary | ICD-10-CM | POA: Diagnosis not present

## 2024-08-31 DIAGNOSIS — G894 Chronic pain syndrome: Secondary | ICD-10-CM | POA: Diagnosis not present

## 2024-08-31 MED ORDER — XTAMPZA ER 9 MG PO C12A: 1.0000 | EXTENDED_RELEASE_CAPSULE | Freq: Two times a day (BID) | ORAL | 0 refills | Status: AC

## 2024-08-31 MED ORDER — XTAMPZA ER 9 MG PO C12A: 1.0000 | EXTENDED_RELEASE_CAPSULE | Freq: Two times a day (BID) | ORAL | 0 refills | Status: DC

## 2024-08-31 NOTE — Assessment & Plan Note (Signed)
 Her BP is a bit elevated.  I am not going to increase her meds at this time.  We will see what her BP is running on her next visit.

## 2024-08-31 NOTE — Progress Notes (Signed)
 Office Visit  Subjective   Patient ID: [name]   DOB: [birth year]   Age: 83 y.o.   MRN: [id number]   Chief Complaint Chief Complaint  Patient presents with   Follow-up    3 Month Follow Up     History of Present Illness The patient is a 83 year old Caucasian/White female who presents for a follow-up evaluation of hypertension. On her last visit, her BP was borderline.  The patient has been checking her blood pressure at home.  She states her systolic BP is running in the 879'd at home.  The patient's current medications include: Lasix  80mg  every other day, spironolactone  25mg  daily.  She was on lisinopril  2.5mg  daily but in 02/2022 her cardiologist stopped this due to low BP.  She gave her amlodipine  2.5mg  po daily as needed for BP >160. The patient has been tolerating her medications well. The patient denies any headache, visual changes, chest pain, shortness of breath, orthopnea, and weakness/numbness. She reports there have been no other symptoms noted.    [name] is a 83 yo female who returns today for followup of her chronic pain syndrome.  On her last visit, we did switch her oxycodone  to Xtampza  9mg  BID due to the Xtampza  working better for her pain.  She was on 5mg  po every 8 hours.   Her pain remains controlled at this time.   This past year, she had worsening pain of her chronic right knee pain.  She did see Dr. Barbarann in ortho in 01/2023 where he noted she has had persistent knee patellar pain since her right knee arthroplasty done in [place] 2007 by Dr.Feder. She had 1 single injection of cortisone in 09/2022 that gave her some relief and Dr. Vernetta discussed with her that repetitive cortisone injections are not a good idea with risks of infection.   X-rays have shown persistent lateral osteophyte or lateral patellofemoral ligament calcification with small crack which looks like a small avulsion.  She was told about this up to 4 years ago and it has been persistent.  She  has had knee aspiration in the past which showed no evidence of infection.  Bone scan showed slight increased uptake around the patella.  Serial x-rays showed no evidence of patellar component loosening.  Her pain seems to be worse when she is on her knee more.  Dr. Barbarann discussed with her that he thinks that a patellar revision will unlikely give her pain relief.  This component appears to be well-fixed and he wants her to use a short knee immobilizer that she can wear intermittently to take some pressure off the knee.  She has previously tried a copper sleeve without resolution.  She has chronic pain due to low back pain as well as pain from her history of bilateral knee replacements and bilateral shoulders.   She is currently on Xtampza  ER 9mg  po TID but she uses this twice a day.  This seems to help control her pain.  We noted she had acute pain in 2022 where she was having worsening pain with weakness and fatigue.  I thought these symptoms were a combination from recovering from COVID-19 and depression.  She did improve at that time.  Due to her acute pain as described , I spoke to Dr. Fernand in Pain Management around 08/2021 where we decided to hold her Xtampza  for a few weeks and put her on short acting oxycodone  5mg  po q 6 hrs prn.  I  did restart her on celexa  and her depression improved at that time.  [name] surgery on 01/2020.  She was on gabapentin  200mg  BID for pain as well but is no longer on this. Her chronic pain effects her neck, shoulders, lower back, hips and her knees.  She has seen Dr. Fernand in pain management who recommended placing her on oxycodone  ER 10mg  po BID.  The patient could not afford this so we switched her to oxycodone  10mg  po q 6 hr prn which she does take 4 times per day.  She states she could not function with her day to day activites without her pain medications.  She has been on MS contin  and oxycodone  ER in the past.  She tells me  that this medication gives her fatigue and all she wants to do is sit down and do nothing and is not able to do alot of her ADL's.  She was previously on percocet 10/325mg  po q 6hr prn (which she took 4 times per day) before the MS contin  and was on this for years.  She was on long term benzos in the past but again we stopped her off of these.  The patient has seen seen Dr. Druscilla (orthopedics) in [place] and Dr. Onetha (neurosurgery).  She remains on cyclobenzaprine  as a muscle relaxant.  The patient had had left shoulder surgery in 2014 due to impingement syndrome and a chronic rotator cuff tear and labral tear where they performed a debridement and synvectomy.  This did not correct her shoulder but did reduce the amount of pain she has had in that arm/shoulder.  Her back pain has remained the same.  She also has chronic back pain as well as pain from her history of bilateral knee replacements and bilateral shoulders.   She continues to have pain in both of her knees when she gets up.  She is still using a cane and no longer uses a walker for longer distances.  The patient denies any falls.  She does use a cane to get around and has [employer].  In regards to her chronic lower back pain, her pain mostly affects her when she stands up.  The pain is an intermittent dull aching she rates as severe at times but again improves with sitting and medications.  There is no pain radiation.  She had back surgery 2012 where her neurosurgeon noted she had worsening back pain with progressive break down of L1-L2 above the levels of her L2-L5 fusion in the past.  They did do surgery and removed some of her hardware.  She has tried PT and pool therapy which she states did not help.  Her pain medicine allows her to do her ADL's including cleaning her house and shopping.  The patient was tried on cymbalta in the past but this made her having nausea and vomiting.  There is no loss of bowel/bladder function or new weakness or numbness.  Her last dose of Xtampza  was this morning.     Past Medical History Past Medical History:  Diagnosis Date   Anemia    bld. transfusion post lumbar surgery- 2012   Anxiety    Arthralgia    NOS   CAD (coronary artery disease)    s/p CABG 2004; s/p DES to LM in 2010;  LHC 10/29/11: EF 50-55%, mild elevated filling pressures, no pulmonary HTN, LM 90% ISR, LAD and CFX occluded, S-RI occluded (old), S-OM3 ok and L-LAD  ok, native nondominant RCA 95% -  med rx recommended ; Lexiscan  Myoview  7/13 at [facility]: demonstrated normal LV function, anterior attenuation and localized ischemia, inferior, basilar, mid section   Carotid artery disease (HCC)    Carotid US  6/19:  RICA 1-39; LICA 40-59; R subclavian stenosis - Repeat 1 year. // Carotid US  05/2019: R 1-39; L 40-59; R vertebral with atypical antegrade flow; R subclavian stenosis >> repeat 1 year // Carotid US  7/21: Bilat 40-59; L subclavian stenosis // Carotid US  7/22: Bilat ICA 40-59; R subclavian stenosis // Carotid US  08/10/23: RICA 1-39, LICA 40-59, L subclavian stenosis   CHF (congestive heart failure) (HCC) 01/21/2020   Chronic diastolic heart failure (HCC)    Echo 9/10: EF 60-55%, grade 1 diastolic dysfunction   Chronic lower back pain    Chronic respiratory failure (HCC)    Cirrhosis (HCC)    CKD (chronic kidney disease), stage III (HCC)    COPD (chronic obstructive pulmonary disease) (HCC)    Emphysema dxed by Dr. Rosamond in [place] based on PFTs per pt in 2006; placed on albuterol    Degenerative disc disease, lumbar    Depression    TAKES CELEXA   AND  (OFF- WELBUTRIN)   Diuretic-induced hypokalemia 10/08/2018   DVT of lower extremity (deep venous thrombosis) (HCC)    recurrent. bilateral (2 episodes)   Dyspnea    home o2 when needed   Dysrhythmia    afib with cabg   Exertional angina (HCC)    Treated with Isosorbide , Ranexa , amlodipine ; intolerant to metoprolol    GERD (gastroesophageal reflux disease)    Gout    on daily  RX (10/06/2018)   HLD (hyperlipidemia)    Hyperlipidemia    Hypertension    Hypothyroidism    L Subclavian Artery Stenosis    Carotid US  08/10/23: RICA 1-39, LICA 40-59, L subclavian stenosis   Obesity (BMI 30-39.9) 2009   BMI 33   On home oxygen  therapy    OSA (obstructive sleep apnea)    Osteoarthritis    all over; hands (10/06/2018)   Osteoporosis    Oxygen  dependent    4L at home as needed (10/06/2018)   PVD (peripheral vascular disease) (HCC)    Restless leg syndrome    Vitamin D  deficiency      Allergies Allergies  Allergen Reactions   Ativan  [Lorazepam ] Other (See Comments)    Confusion    Pitavastatin Itching and Other (See Comments)    Reaction to Livalo   Ropinirole Nausea Only and Other (See Comments)    Makes legs jump, also   Zofran  [Ondansetron  Hcl] Nausea Only   Zolpidem Tartrate Anxiety and Other (See Comments)    CONFUSION    Zolpidem Tartrate Swelling    CONFUSION   Penicillins Rash and Other (See Comments)    Tolerated cephalosporins in past Has patient had a PCN reaction causing immediate rash, facial/tongue/throat swelling, SOB or lightheadedness with hypotension: Yes Has patient had a PCN reaction causing severe rash involving mucus membranes or skin necrosis: No Has patient had a PCN reaction that required hospitalization: No Has patient had a PCN reaction occurring within the last 10 years: No If all of the above answers are NO, then may proceed with Cephalosporin use.      Medications  Current Outpatient Medications:    albuterol  (PROVENTIL ) (2.5 MG/3ML) 0.083% nebulizer solution, Take 2.5 mg by nebulization every 6 (six) hours as needed for wheezing or shortness of breath., Disp: , Rfl:    allopurinol  (  ZYLOPRIM ) 100 MG tablet, TAKE 1 TABLET BY MOUTH DAILY WITH BREAKFAST., Disp: 90 tablet, Rfl: 0   alum & mag hydroxide-simeth (MAALOX MAX) 400-400-40 MG/5ML suspension, Take 15 mLs by mouth every 6 (six) hours as needed for indigestion.,  Disp: 355 mL, Rfl: 0   amLODipine  (NORVASC ) 2.5 MG tablet, Take 1/2 tablet as needed for BP greater than 160., Disp: 30 tablet, Rfl: 1   ARIPiprazole  (ABILIFY ) 2 MG tablet, TAKE 1 TABLET BY MOUTH ONCE DAILY, Disp: 90 tablet, Rfl: 0   aspirin  EC 81 MG tablet, Take 81 mg by mouth daily with breakfast. Swallow whole., Disp: , Rfl:    atorvastatin  (LIPITOR) 20 MG tablet, TAKE 1 TABLET BY MOUTH AT BEDTIME, Disp: 90 tablet, Rfl: 0   cholecalciferol  (VITAMIN D3) 25 MCG (1000 UNIT) tablet, Take 1 tablet (1,000 Units total) by mouth daily with supper., Disp: 90 tablet, Rfl: 1   citalopram  (CELEXA ) 10 MG tablet, TAKE 1 TABLET BY MOUTH DAILY., Disp: 90 tablet, Rfl: 0   cyclobenzaprine  (FLEXERIL ) 10 MG tablet, Take 1 tablet (10 mg total) by mouth 3 (three) times daily., Disp: 270 tablet, Rfl: 0   D3-1000 25 MCG (1000 UT) capsule, TAKE 1 CAPSULE BY MOUTH DAILY WITH SUPPER., Disp: 90 capsule, Rfl: 0   dapagliflozin  propanediol (FARXIGA ) 10 MG TABS tablet, Take 1 tablet (10 mg total) by mouth daily before breakfast., Disp: 90 tablet, Rfl: 3   Docusate Sodium  (DSS) 100 MG CAPS, Take 100 mg by mouth daily as needed (constipation)., Disp: , Rfl:    donepezil  (ARICEPT ) 10 MG tablet, TAKE 1 TABLET BY MOUTH AT BEDTIME., Disp: 30 tablet, Rfl: 0   feeding supplement (ENSURE ENLIVE / ENSURE PLUS) LIQD, Take 237 mLs by mouth 2 (two) times daily between meals., Disp: , Rfl:    ferrous sulfate  325 (65 FE) MG tablet, TAKE 1 TABLET BY MOUTH 2 TIMES DAILY, Disp: 180 tablet, Rfl: 0   Fluticasone -Umeclidin-Vilant (TRELEGY ELLIPTA ) 200-62.5-25 MCG/ACT AEPB, Inhale 1 puff into the lungs daily., Disp: 1 each, Rfl: 5   Fluticasone -Umeclidin-Vilant (TRELEGY ELLIPTA ) 200-62.5-25 MCG/ACT AEPB, Inhale 200 mg into the lungs daily., Disp: , Rfl:    furosemide  (LASIX ) 80 MG tablet, TAKE 1 TABLET BY MOUTH DAILY., Disp: 90 tablet, Rfl: 3   gabapentin  (NEURONTIN ) 100 MG capsule, TAKE 2 CAPSULES BY MOUTH TWICE DAILY, Disp: 360 capsule, Rfl: 0    Ipratropium-Albuterol  (COMBIVENT  RESPIMAT) 20-100 MCG/ACT AERS respimat, Inhale 1 puff into the lungs 3 (three) times daily as needed for wheezing or shortness of breath., Disp: , Rfl:    levothyroxine  (SYNTHROID ) 50 MCG tablet, Take 1 tablet (50 mcg total) by mouth daily before breakfast., Disp: 90 tablet, Rfl: 1   Magnesium  Oxide -Mg Supplement 500 MG CAPS, Take 1 capsule (500 mg total) by mouth daily., Disp: 90 capsule, Rfl: 0   Magnesium  Oxide -Mg Supplement 500 MG CAPS, TAKE 1 CAPSULE BY MOUTH DAILY, Disp: 90 capsule, Rfl: 0   Melatonin 10 MG TABS, Take 10 mg by mouth at bedtime., Disp: , Rfl:    memantine  (NAMENDA ) 5 MG tablet, TAKE 1 TABLET BY MOUTH 2 TIMES DAILY., Disp: 60 tablet, Rfl: 0   metoprolol  tartrate (LOPRESSOR ) 25 MG tablet, TAKE 1/2 TABLET BY MOUTH 2 TIMES DAILY., Disp: 15 tablet, Rfl: 0   Multiple Vitamin (MULTIVITAMIN WITH MINERALS) TABS tablet, Take 1 tablet by mouth daily., Disp: , Rfl:    nitroGLYCERIN  (NITROSTAT ) 0.4 MG SL tablet, Place 1 tablet (0.4 mg total) under the tongue every 5 (five)  minutes as needed for chest pain. For chest pain, Disp: 25 tablet, Rfl: 3   OXYGEN , Inhale 4 L/min into the lungs as needed (DURING ALL TIMES OF EXERTION). 3L at night when needed., Disp: , Rfl:    pantoprazole  (PROTONIX ) 40 MG tablet, TAKE 1 TABLET BY MOUTH DAILY., Disp: 90 tablet, Rfl: 2   pilocarpine  (PILOCAR) 1 % ophthalmic solution, Place 1 drop into both eyes 2 (two) times daily., Disp: , Rfl:    polyethylene glycol (MIRALAX  / GLYCOLAX ) 17 g packet, Take 17 g by mouth as needed., Disp: , Rfl:    potassium chloride  SA (KLOR-CON  M) 20 MEQ tablet, TAKE 1 TABLET ONCE DAILY WITH FOOD, Disp: 90 tablet, Rfl: 0   pramipexole  (MIRAPEX ) 0.25 MG tablet, TAKE 1 TABLET 2-3 HOURS BEFORE BEDTIME, Disp: 90 tablet, Rfl: 0   roflumilast  (DALIRESP ) 500 MCG TABS tablet, TAKE 1 TABLET ONCE DAILY, Disp: 90 tablet, Rfl: 0   spironolactone  (ALDACTONE ) 25 MG tablet, Take 1 tablet (25 mg total) by mouth  daily., Disp: 90 tablet, Rfl: 3   traZODone  (DESYREL ) 50 MG tablet, TAKE 3 TABLETS EVERY NIGHT AT BEDTIME., Disp: 270 tablet, Rfl: 0   vitamin C (ASCORBIC ACID ) 500 MG tablet, Take 500 mg by mouth daily with lunch., Disp: , Rfl:    Review of Systems Review of Systems  Constitutional:  Negative for chills, fever, malaise/fatigue and weight loss.  Eyes:  Negative for blurred vision and double vision.  Respiratory:  Negative for cough and shortness of breath.   Cardiovascular:  Negative for chest pain, palpitations and leg swelling.  Gastrointestinal:  Negative for abdominal pain, constipation, diarrhea, nausea and vomiting.  Musculoskeletal:  Negative for myalgias.  Skin:  Negative for itching and rash.  Neurological:  Negative for dizziness, weakness and headaches.       Objective:    Vitals BP (!) 152/62   Pulse 66   Temp 97.6 F (36.4 C) (Temporal)   Resp 16   Ht 5' 8 (1.727 m)   Wt 166 lb 3.2 oz (75.4 kg)   SpO2 94%   BMI 25.27 kg/m    Physical Examination Physical Exam Constitutional:      Appearance: Normal appearance. She is not ill-appearing.  Cardiovascular:     Rate and Rhythm: Normal rate and regular rhythm.     Pulses: Normal pulses.     Heart sounds: No murmur heard.    No friction rub. No gallop.  Pulmonary:     Effort: Pulmonary effort is normal. No respiratory distress.     Breath sounds: No wheezing, rhonchi or rales.  Abdominal:     General: Bowel sounds are normal. There is no distension.     Palpations: Abdomen is soft.     Tenderness: There is no abdominal tenderness.  Musculoskeletal:     Right lower leg: No edema.     Left lower leg: No edema.  Skin:    General: Skin is warm and dry.     Findings: No rash.  Neurological:     Mental Status: She is alert.        Assessment & Plan:   Essential hypertension Her BP is a bit elevated.  I am not going to increase her meds at this time.  We will see what her BP is running on her next  visit.  Chronic pain Her Xtampza  is working well.  I reviewed her [place] CSR/PDMP.  We will continue on Xtampza  9mg  po BID.  Return in about 3 months (around 11/30/2024).   Selinda Fleeta Finger, MD

## 2024-08-31 NOTE — Addendum Note (Signed)
 Addended by: LENETTA LACKS on: 08/31/2024 04:05 PM   Modules accepted: Orders

## 2024-08-31 NOTE — Assessment & Plan Note (Signed)
 Her Xtampza  is working well.  I reviewed her [place] CSR/PDMP.  We will continue on Xtampza  9mg  po BID.

## 2024-09-06 ENCOUNTER — Other Ambulatory Visit: Payer: Self-pay | Admitting: Internal Medicine

## 2024-09-07 ENCOUNTER — Other Ambulatory Visit: Payer: Self-pay | Admitting: Internal Medicine

## 2024-09-07 DIAGNOSIS — I471 Supraventricular tachycardia, unspecified: Secondary | ICD-10-CM

## 2024-09-08 DIAGNOSIS — K219 Gastro-esophageal reflux disease without esophagitis: Secondary | ICD-10-CM | POA: Diagnosis not present

## 2024-09-16 ENCOUNTER — Other Ambulatory Visit: Payer: Self-pay | Admitting: Internal Medicine

## 2024-09-16 MED ORDER — OXYCODONE HCL 5 MG PO TABS: 5.0000 mg | ORAL_TABLET | Freq: Three times a day (TID) | ORAL | 0 refills | Status: DC | PRN

## 2024-09-20 DIAGNOSIS — K2289 Other specified disease of esophagus: Secondary | ICD-10-CM | POA: Diagnosis not present

## 2024-09-20 DIAGNOSIS — R1310 Dysphagia, unspecified: Secondary | ICD-10-CM | POA: Diagnosis not present

## 2024-09-20 DIAGNOSIS — K219 Gastro-esophageal reflux disease without esophagitis: Secondary | ICD-10-CM | POA: Diagnosis not present

## 2024-09-21 ENCOUNTER — Other Ambulatory Visit: Payer: Self-pay

## 2024-09-21 ENCOUNTER — Other Ambulatory Visit: Payer: Self-pay | Admitting: Internal Medicine

## 2024-09-21 MED ORDER — ATORVASTATIN CALCIUM 20 MG PO TABS: 20.0000 mg | ORAL_TABLET | Freq: Every day | ORAL | 0 refills | Status: AC

## 2024-09-21 MED ORDER — ALLOPURINOL 100 MG PO TABS: 100.0000 mg | ORAL_TABLET | Freq: Every day | ORAL | 0 refills | Status: AC

## 2024-09-21 MED ORDER — ATORVASTATIN CALCIUM 20 MG PO TABS: 20.0000 mg | ORAL_TABLET | Freq: Every day | ORAL | 0 refills | Status: DC

## 2024-09-26 ENCOUNTER — Ambulatory Visit: Admitting: Internal Medicine

## 2024-09-26 ENCOUNTER — Encounter: Payer: Self-pay | Admitting: Internal Medicine

## 2024-09-26 VITALS — BP 124/78 | HR 93 | Temp 97.8°F | Resp 18 | Ht 68.0 in | Wt 161.1 lb

## 2024-09-26 DIAGNOSIS — L89311 Pressure ulcer of right buttock, stage 1: Secondary | ICD-10-CM | POA: Insufficient documentation

## 2024-09-26 NOTE — Progress Notes (Signed)
 Office Visit  Subjective   Patient ID: [name]   DOB: June 16, 1941   Age: 83 y.o.   MRN: [id number]   Chief Complaint Chief Complaint  Patient presents with   Office visit    Sore on backside keeps getting worse     History of Present Illness [name] is a 83 yo female who comes in today with pressure sore that started about 3 weeks ago.  She states this is located on her sacrum/upper crease of her buttocks that will heal and then will comes back.  It has now returned and she states it is very painful.  There is no drainage but there is increased warmth, erythema and pain.  She has been using calmoseptine and putting a bandage over this area.     Past Medical History Past Medical History:  Diagnosis Date   Anemia    bld. transfusion post lumbar surgery- 2012   Anxiety    Arthralgia    NOS   CAD (coronary artery disease)    s/p CABG 2004; s/p DES to LM in 2010;  LHC 10/29/11: EF 50-55%, mild elevated filling pressures, no pulmonary HTN, LM 90% ISR, LAD and CFX occluded, S-RI occluded (old), S-OM3 ok and L-LAD ok, native nondominant RCA 95% -  med rx recommended ; Lexiscan  Myoview  7/13 at [facility]: demonstrated normal LV function, anterior attenuation and localized ischemia, inferior, basilar, mid section   Carotid artery disease    Carotid US  6/19:  RICA 1-39; LICA 40-59; R subclavian stenosis - Repeat 1 year. // Carotid US  05/2019: R 1-39; L 40-59; R vertebral with atypical antegrade flow; R subclavian stenosis >> repeat 1 year // Carotid US  7/21: Bilat 40-59; L subclavian stenosis // Carotid US  7/22: Bilat ICA 40-59; R subclavian stenosis // Carotid US  08/10/23: RICA 1-39, LICA 40-59, L subclavian stenosis   CHF (congestive heart failure) (HCC) 01/21/2020   Chronic diastolic heart failure (HCC)    Echo 9/10: EF 60-55%, grade 1 diastolic dysfunction   Chronic lower back pain    Chronic respiratory failure (HCC)    Cirrhosis (HCC)    CKD (chronic kidney disease),  stage III (HCC)    COPD (chronic obstructive pulmonary disease) (HCC)    Emphysema dxed by Dr. Rosamond in [place] based on PFTs per pt in 2006; placed on albuterol    Degenerative disc disease, lumbar    Depression    TAKES CELEXA   AND  (OFF- WELBUTRIN)   Diuretic-induced hypokalemia 10/08/2018   DVT of lower extremity (deep venous thrombosis) (HCC)    recurrent. bilateral (2 episodes)   Dyspnea    home o2 when needed   Dysrhythmia    afib with cabg   Exertional angina    Treated with Isosorbide , Ranexa , amlodipine ; intolerant to metoprolol    GERD (gastroesophageal reflux disease)    Gout    on daily RX (10/06/2018)   HLD (hyperlipidemia)    Hyperlipidemia    Hypertension    Hypothyroidism    L Subclavian Artery Stenosis    Carotid US  08/10/23: RICA 1-39, LICA 40-59, L subclavian stenosis   Obesity (BMI 30-39.9) 2009   BMI 33   On home oxygen  therapy    OSA (obstructive sleep apnea)    Osteoarthritis    all over; hands (10/06/2018)   Osteoporosis    Oxygen  dependent    4L at home as needed (10/06/2018)   PVD (peripheral vascular disease)    Restless leg syndrome    Vitamin D   deficiency      Allergies Allergies  Allergen Reactions   Ativan  [Lorazepam ] Other (See Comments)    Confusion    Pitavastatin Itching and Other (See Comments)    Reaction to Livalo   Ropinirole Nausea Only and Other (See Comments)    Makes legs jump, also   Zofran  [Ondansetron  Hcl] Nausea Only   Zolpidem Tartrate Anxiety and Other (See Comments)    CONFUSION    Zolpidem Tartrate Swelling    CONFUSION   Penicillins Rash and Other (See Comments)    Tolerated cephalosporins in past Has patient had a PCN reaction causing immediate rash, facial/tongue/throat swelling, SOB or lightheadedness with hypotension: Yes Has patient had a PCN reaction causing severe rash involving mucus membranes or skin necrosis: No Has patient had a PCN reaction that required hospitalization: No Has patient had a PCN  reaction occurring within the last 10 years: No If all of the above answers are NO, then may proceed with Cephalosporin use.      Medications  Current Outpatient Medications:    albuterol  (PROVENTIL ) (2.5 MG/3ML) 0.083% nebulizer solution, Take 2.5 mg by nebulization every 6 (six) hours as needed for wheezing or shortness of breath., Disp: , Rfl:    allopurinol  (ZYLOPRIM ) 100 MG tablet, Take 1 tablet (100 mg total) by mouth daily with breakfast., Disp: 90 tablet, Rfl: 0   alum & mag hydroxide-simeth (MAALOX MAX) 400-400-40 MG/5ML suspension, Take 15 mLs by mouth every 6 (six) hours as needed for indigestion., Disp: 355 mL, Rfl: 0   amLODipine  (NORVASC ) 2.5 MG tablet, Take 1/2 tablet as needed for BP greater than 160., Disp: 30 tablet, Rfl: 1   ARIPiprazole  (ABILIFY ) 2 MG tablet, TAKE 1 TABLET BY MOUTH ONCE DAILY, Disp: 90 tablet, Rfl: 0   aspirin  EC 81 MG tablet, Take 81 mg by mouth daily with breakfast. Swallow whole., Disp: , Rfl:    atorvastatin  (LIPITOR) 20 MG tablet, Take 1 tablet (20 mg total) by mouth at bedtime., Disp: 90 tablet, Rfl: 0   cholecalciferol  (VITAMIN D3) 25 MCG (1000 UNIT) tablet, Take 1 tablet (1,000 Units total) by mouth daily with supper., Disp: 90 tablet, Rfl: 1   citalopram  (CELEXA ) 10 MG tablet, TAKE 1 TABLET BY MOUTH DAILY., Disp: 90 tablet, Rfl: 0   cyclobenzaprine  (FLEXERIL ) 10 MG tablet, Take 1 tablet (10 mg total) by mouth 3 (three) times daily., Disp: 270 tablet, Rfl: 0   D3-1000 25 MCG (1000 UT) capsule, TAKE 1 CAPSULE BY MOUTH DAILY WITH SUPPER., Disp: 90 capsule, Rfl: 0   dapagliflozin  propanediol (FARXIGA ) 10 MG TABS tablet, Take 1 tablet (10 mg total) by mouth daily before breakfast., Disp: 90 tablet, Rfl: 3   Docusate Sodium  (DSS) 100 MG CAPS, Take 100 mg by mouth daily as needed (constipation)., Disp: , Rfl:    donepezil  (ARICEPT ) 10 MG tablet, TAKE 1 TABLET BY MOUTH AT BEDTIME., Disp: 30 tablet, Rfl: 0   feeding supplement (ENSURE ENLIVE / ENSURE PLUS)  LIQD, Take 237 mLs by mouth 2 (two) times daily between meals., Disp: , Rfl:    ferrous sulfate  325 (65 FE) MG tablet, TAKE 1 TABLET BY MOUTH 2 TIMES DAILY, Disp: 180 tablet, Rfl: 0   Fluticasone -Umeclidin-Vilant (TRELEGY ELLIPTA ) 200-62.5-25 MCG/ACT AEPB, Inhale 1 puff into the lungs daily., Disp: 1 each, Rfl: 5   Fluticasone -Umeclidin-Vilant (TRELEGY ELLIPTA ) 200-62.5-25 MCG/ACT AEPB, Inhale 200 mg into the lungs daily., Disp: , Rfl:    furosemide  (LASIX ) 80 MG tablet, TAKE 1 TABLET BY MOUTH DAILY.,  Disp: 90 tablet, Rfl: 3   gabapentin  (NEURONTIN ) 100 MG capsule, TAKE 2 CAPSULES BY MOUTH TWICE DAILY, Disp: 360 capsule, Rfl: 0   Ipratropium-Albuterol  (COMBIVENT  RESPIMAT) 20-100 MCG/ACT AERS respimat, Inhale 1 puff into the lungs 3 (three) times daily as needed for wheezing or shortness of breath., Disp: , Rfl:    levothyroxine  (SYNTHROID ) 50 MCG tablet, Take 1 tablet (50 mcg total) by mouth daily before breakfast., Disp: 90 tablet, Rfl: 1   Magnesium  Oxide -Mg Supplement 500 MG CAPS, Take 1 capsule (500 mg total) by mouth daily., Disp: 90 capsule, Rfl: 0   Magnesium  Oxide -Mg Supplement 500 MG CAPS, TAKE 1 CAPSULE BY MOUTH DAILY, Disp: 90 capsule, Rfl: 0   Melatonin 10 MG TABS, Take 10 mg by mouth at bedtime., Disp: , Rfl:    memantine  (NAMENDA ) 5 MG tablet, TAKE 1 TABLET BY MOUTH 2 TIMES DAILY., Disp: 60 tablet, Rfl: 0   metoprolol  tartrate (LOPRESSOR ) 25 MG tablet, TAKE 1/2 TABLET BY MOUTH 2 TIMES DAILY., Disp: 15 tablet, Rfl: 0   Multiple Vitamin (MULTIVITAMIN WITH MINERALS) TABS tablet, Take 1 tablet by mouth daily., Disp: , Rfl:    nitroGLYCERIN  (NITROSTAT ) 0.4 MG SL tablet, Place 1 tablet (0.4 mg total) under the tongue every 5 (five) minutes as needed for chest pain. For chest pain, Disp: 25 tablet, Rfl: 3   oxyCODONE  (OXY IR/ROXICODONE ) 5 MG immediate release tablet, Take 1 tablet (5 mg total) by mouth every 8 (eight) hours as needed for severe pain (pain score 7-10)., Disp: 90 tablet, Rfl:  0   oxyCODONE  ER (XTAMPZA  ER) 9 MG C12A, Take 1 capsule by mouth in the morning and at bedtime., Disp: 60 capsule, Rfl: 0   [START ON 10/10/2024] oxyCODONE  ER (XTAMPZA  ER) 9 MG C12A, Take 1 capsule by mouth in the morning and at bedtime., Disp: 60 capsule, Rfl: 0   [START ON 11/09/2024] oxyCODONE  ER (XTAMPZA  ER) 9 MG C12A, Take 1 capsule by mouth in the morning and at bedtime., Disp: 60 capsule, Rfl: 0   OXYGEN , Inhale 4 L/min into the lungs as needed (DURING ALL TIMES OF EXERTION). 3L at night when needed., Disp: , Rfl:    pantoprazole  (PROTONIX ) 40 MG tablet, TAKE 1 TABLET BY MOUTH DAILY., Disp: 90 tablet, Rfl: 2   pilocarpine  (PILOCAR) 1 % ophthalmic solution, Place 1 drop into both eyes 2 (two) times daily., Disp: , Rfl:    polyethylene glycol (MIRALAX  / GLYCOLAX ) 17 g packet, Take 17 g by mouth as needed., Disp: , Rfl:    potassium chloride  SA (KLOR-CON  M) 20 MEQ tablet, TAKE 1 TABLET ONCE DAILY WITH FOOD, Disp: 90 tablet, Rfl: 0   pramipexole  (MIRAPEX ) 0.25 MG tablet, TAKE 1 TABLET 2-3 HOURS BEFORE BEDTIME, Disp: 90 tablet, Rfl: 0   roflumilast  (DALIRESP ) 500 MCG TABS tablet, TAKE 1 TABLET ONCE DAILY, Disp: 90 tablet, Rfl: 0   spironolactone  (ALDACTONE ) 25 MG tablet, Take 1 tablet (25 mg total) by mouth daily., Disp: 90 tablet, Rfl: 3   traZODone  (DESYREL ) 50 MG tablet, TAKE 3 TABLETS EVERY NIGHT AT BEDTIME., Disp: 270 tablet, Rfl: 0   vitamin C (ASCORBIC ACID ) 500 MG tablet, Take 500 mg by mouth daily with lunch., Disp: , Rfl:    Review of Systems Review of Systems  Constitutional:  Negative for chills and fever.  Respiratory:  Positive for shortness of breath.   Gastrointestinal:  Negative for abdominal pain, constipation and diarrhea.  Musculoskeletal:  Negative for myalgias.  Skin:  Negative  for rash.  Neurological:  Negative for dizziness, weakness and headaches.       Objective:    Vitals BP 124/78 (BP Location: Left Arm, Patient Position: Sitting, Cuff Size: Normal)   Pulse  93   Temp 97.8 F (36.6 C)   Resp 18   Ht 5' 8 (1.727 m)   Wt 161 lb 2 oz (73.1 kg)   SpO2 97%   BMI 24.50 kg/m    Physical Examination Physical Exam Constitutional:      Appearance: Normal appearance. She is not ill-appearing.  Cardiovascular:     Rate and Rhythm: Normal rate and regular rhythm.     Pulses: Normal pulses.     Heart sounds: No murmur heard.    No friction rub. No gallop.  Pulmonary:     Effort: Pulmonary effort is normal. No respiratory distress.     Breath sounds: No wheezing, rhonchi or rales.  Abdominal:     General: Bowel sounds are normal. There is no distension.     Palpations: Abdomen is soft.     Tenderness: There is no abdominal tenderness.  Musculoskeletal:     Right lower leg: No edema.     Left lower leg: No edema.  Skin:    General: Skin is warm and dry.     Findings: No rash.     Comments: She a stage I pressure ulcer to her right buttock just 3 oclock to her anus.  This measure 2cm x 2 cm without erythema or drainage.  Neurological:     Mental Status: She is alert.        Assessment & Plan:   Stage I pressure ulcer of right buttock She has a superficial pressure ulcer to her right buttock that is a stage I pressure ulcer.  There is no signs of infection.  I want her to use calmoseptine as a barrier cream and cover this with a bandage.  WE will see her back in 1 week for followup.    Return in about 1 week (around 10/03/2024).   Selinda Fleeta Finger, MD

## 2024-09-26 NOTE — Assessment & Plan Note (Signed)
 She has a superficial pressure ulcer to her right buttock that is a stage I pressure ulcer.  There is no signs of infection.  I want her to use calmoseptine as a barrier cream and cover this with a bandage.  WE will see her back in 1 week for followup.

## 2024-10-03 ENCOUNTER — Ambulatory Visit: Admitting: Internal Medicine

## 2024-10-03 ENCOUNTER — Encounter: Payer: Self-pay | Admitting: Internal Medicine

## 2024-10-03 VITALS — BP 126/78 | HR 94 | Temp 97.7°F | Resp 18 | Ht 68.0 in | Wt 160.2 lb

## 2024-10-03 DIAGNOSIS — L89311 Pressure ulcer of right buttock, stage 1: Secondary | ICD-10-CM

## 2024-10-03 NOTE — Assessment & Plan Note (Signed)
 I want her to continue the calmoseptine ointment and her wound seems to be better.  If it is not healed in the next 2-3 weeks, she needs to let us  know.

## 2024-10-03 NOTE — Progress Notes (Signed)
 Office Visit  Subjective   Patient ID: [name]   DOB: 04-30-1941   Age: 83 y.o.   MRN: [id number]   Chief Complaint Chief Complaint  Patient presents with   Pressure Ulcer    1 week follow up     History of Present Illness [name] is a 83 yo female who returns today for followup of a Stage I pressure ulcer of her right buttock/sacrum.  I saw her a week ago and asked her to put calmoseptine on this wound.  There is no erythema or drainage but she is still having some pain.       Past Medical History Past Medical History:  Diagnosis Date   Anemia    bld. transfusion post lumbar surgery- 2012   Anxiety    Arthralgia    NOS   CAD (coronary artery disease)    s/p CABG 2004; s/p DES to LM in 2010;  LHC 10/29/11: EF 50-55%, mild elevated filling pressures, no pulmonary HTN, LM 90% ISR, LAD and CFX occluded, S-RI occluded (old), S-OM3 ok and L-LAD ok, native nondominant RCA 95% -  med rx recommended ; Lexiscan  Myoview  7/13 at [facility]: demonstrated normal LV function, anterior attenuation and localized ischemia, inferior, basilar, mid section   Carotid artery disease    Carotid US  6/19:  RICA 1-39; LICA 40-59; R subclavian stenosis - Repeat 1 year. // Carotid US  05/2019: R 1-39; L 40-59; R vertebral with atypical antegrade flow; R subclavian stenosis >> repeat 1 year // Carotid US  7/21: Bilat 40-59; L subclavian stenosis // Carotid US  7/22: Bilat ICA 40-59; R subclavian stenosis // Carotid US  08/10/23: RICA 1-39, LICA 40-59, L subclavian stenosis   CHF (congestive heart failure) (HCC) 01/21/2020   Chronic diastolic heart failure (HCC)    Echo 9/10: EF 60-55%, grade 1 diastolic dysfunction   Chronic lower back pain    Chronic respiratory failure (HCC)    Cirrhosis (HCC)    CKD (chronic kidney disease), stage III (HCC)    COPD (chronic obstructive pulmonary disease) (HCC)    Emphysema dxed by Dr. Rosamond in [place] based on PFTs per pt in 2006; placed on albuterol     Degenerative disc disease, lumbar    Depression    TAKES CELEXA   AND  (OFF- WELBUTRIN)   Diuretic-induced hypokalemia 10/08/2018   DVT of lower extremity (deep venous thrombosis) (HCC)    recurrent. bilateral (2 episodes)   Dyspnea    home o2 when needed   Dysrhythmia    afib with cabg   Exertional angina    Treated with Isosorbide , Ranexa , amlodipine ; intolerant to metoprolol    GERD (gastroesophageal reflux disease)    Gout    on daily RX (10/06/2018)   HLD (hyperlipidemia)    Hyperlipidemia    Hypertension    Hypothyroidism    L Subclavian Artery Stenosis    Carotid US  08/10/23: RICA 1-39, LICA 40-59, L subclavian stenosis   Obesity (BMI 30-39.9) 2009   BMI 33   On home oxygen  therapy    OSA (obstructive sleep apnea)    Osteoarthritis    all over; hands (10/06/2018)   Osteoporosis    Oxygen  dependent    4L at home as needed (10/06/2018)   PVD (peripheral vascular disease)    Restless leg syndrome    Vitamin D  deficiency      Allergies Allergies  Allergen Reactions   Ativan  [Lorazepam ] Other (See Comments)    Confusion    Pitavastatin Itching  and Other (See Comments)    Reaction to Livalo   Ropinirole Nausea Only and Other (See Comments)    Makes legs jump, also   Zofran  [Ondansetron  Hcl] Nausea Only   Zolpidem Tartrate Anxiety and Other (See Comments)    CONFUSION    Zolpidem Tartrate Swelling    CONFUSION   Penicillins Rash and Other (See Comments)    Tolerated cephalosporins in past Has patient had a PCN reaction causing immediate rash, facial/tongue/throat swelling, SOB or lightheadedness with hypotension: Yes Has patient had a PCN reaction causing severe rash involving mucus membranes or skin necrosis: No Has patient had a PCN reaction that required hospitalization: No Has patient had a PCN reaction occurring within the last 10 years: No If all of the above answers are NO, then may proceed with Cephalosporin use.      Medications  Current  Outpatient Medications:    albuterol  (PROVENTIL ) (2.5 MG/3ML) 0.083% nebulizer solution, Take 2.5 mg by nebulization every 6 (six) hours as needed for wheezing or shortness of breath., Disp: , Rfl:    allopurinol  (ZYLOPRIM ) 100 MG tablet, Take 1 tablet (100 mg total) by mouth daily with breakfast., Disp: 90 tablet, Rfl: 0   alum & mag hydroxide-simeth (MAALOX MAX) 400-400-40 MG/5ML suspension, Take 15 mLs by mouth every 6 (six) hours as needed for indigestion., Disp: 355 mL, Rfl: 0   amLODipine  (NORVASC ) 2.5 MG tablet, Take 1/2 tablet as needed for BP greater than 160., Disp: 30 tablet, Rfl: 1   ARIPiprazole  (ABILIFY ) 2 MG tablet, TAKE 1 TABLET BY MOUTH ONCE DAILY, Disp: 90 tablet, Rfl: 0   aspirin  EC 81 MG tablet, Take 81 mg by mouth daily with breakfast. Swallow whole., Disp: , Rfl:    atorvastatin  (LIPITOR) 20 MG tablet, Take 1 tablet (20 mg total) by mouth at bedtime., Disp: 90 tablet, Rfl: 0   cholecalciferol  (VITAMIN D3) 25 MCG (1000 UNIT) tablet, Take 1 tablet (1,000 Units total) by mouth daily with supper., Disp: 90 tablet, Rfl: 1   citalopram  (CELEXA ) 10 MG tablet, TAKE 1 TABLET BY MOUTH DAILY., Disp: 90 tablet, Rfl: 0   cyclobenzaprine  (FLEXERIL ) 10 MG tablet, Take 1 tablet (10 mg total) by mouth 3 (three) times daily., Disp: 270 tablet, Rfl: 0   D3-1000 25 MCG (1000 UT) capsule, TAKE 1 CAPSULE BY MOUTH DAILY WITH SUPPER., Disp: 90 capsule, Rfl: 0   dapagliflozin  propanediol (FARXIGA ) 10 MG TABS tablet, Take 1 tablet (10 mg total) by mouth daily before breakfast., Disp: 90 tablet, Rfl: 3   Docusate Sodium  (DSS) 100 MG CAPS, Take 100 mg by mouth daily as needed (constipation)., Disp: , Rfl:    donepezil  (ARICEPT ) 10 MG tablet, TAKE 1 TABLET BY MOUTH AT BEDTIME., Disp: 30 tablet, Rfl: 0   feeding supplement (ENSURE ENLIVE / ENSURE PLUS) LIQD, Take 237 mLs by mouth 2 (two) times daily between meals., Disp: , Rfl:    ferrous sulfate  325 (65 FE) MG tablet, TAKE 1 TABLET BY MOUTH 2 TIMES DAILY,  Disp: 180 tablet, Rfl: 0   Fluticasone -Umeclidin-Vilant (TRELEGY ELLIPTA ) 200-62.5-25 MCG/ACT AEPB, Inhale 1 puff into the lungs daily., Disp: 1 each, Rfl: 5   Fluticasone -Umeclidin-Vilant (TRELEGY ELLIPTA ) 200-62.5-25 MCG/ACT AEPB, Inhale 200 mg into the lungs daily., Disp: , Rfl:    furosemide  (LASIX ) 80 MG tablet, TAKE 1 TABLET BY MOUTH DAILY., Disp: 90 tablet, Rfl: 3   gabapentin  (NEURONTIN ) 100 MG capsule, TAKE 2 CAPSULES BY MOUTH TWICE DAILY, Disp: 360 capsule, Rfl: 0   Ipratropium-Albuterol  (  COMBIVENT  RESPIMAT) 20-100 MCG/ACT AERS respimat, Inhale 1 puff into the lungs 3 (three) times daily as needed for wheezing or shortness of breath., Disp: , Rfl:    levothyroxine  (SYNTHROID ) 50 MCG tablet, Take 1 tablet (50 mcg total) by mouth daily before breakfast., Disp: 90 tablet, Rfl: 1   Magnesium  Oxide -Mg Supplement 500 MG CAPS, Take 1 capsule (500 mg total) by mouth daily., Disp: 90 capsule, Rfl: 0   Magnesium  Oxide -Mg Supplement 500 MG CAPS, TAKE 1 CAPSULE BY MOUTH DAILY, Disp: 90 capsule, Rfl: 0   Melatonin 10 MG TABS, Take 10 mg by mouth at bedtime., Disp: , Rfl:    memantine  (NAMENDA ) 5 MG tablet, TAKE 1 TABLET BY MOUTH 2 TIMES DAILY., Disp: 60 tablet, Rfl: 0   metoprolol  tartrate (LOPRESSOR ) 25 MG tablet, TAKE 1/2 TABLET BY MOUTH 2 TIMES DAILY., Disp: 15 tablet, Rfl: 0   Multiple Vitamin (MULTIVITAMIN WITH MINERALS) TABS tablet, Take 1 tablet by mouth daily., Disp: , Rfl:    nitroGLYCERIN  (NITROSTAT ) 0.4 MG SL tablet, Place 1 tablet (0.4 mg total) under the tongue every 5 (five) minutes as needed for chest pain. For chest pain, Disp: 25 tablet, Rfl: 3   oxyCODONE  (OXY IR/ROXICODONE ) 5 MG immediate release tablet, Take 1 tablet (5 mg total) by mouth every 8 (eight) hours as needed for severe pain (pain score 7-10)., Disp: 90 tablet, Rfl: 0   oxyCODONE  ER (XTAMPZA  ER) 9 MG C12A, Take 1 capsule by mouth in the morning and at bedtime., Disp: 60 capsule, Rfl: 0   [START ON 10/10/2024] oxyCODONE   ER (XTAMPZA  ER) 9 MG C12A, Take 1 capsule by mouth in the morning and at bedtime., Disp: 60 capsule, Rfl: 0   [START ON 11/09/2024] oxyCODONE  ER (XTAMPZA  ER) 9 MG C12A, Take 1 capsule by mouth in the morning and at bedtime., Disp: 60 capsule, Rfl: 0   OXYGEN , Inhale 4 L/min into the lungs as needed (DURING ALL TIMES OF EXERTION). 3L at night when needed., Disp: , Rfl:    pantoprazole  (PROTONIX ) 40 MG tablet, TAKE 1 TABLET BY MOUTH DAILY., Disp: 90 tablet, Rfl: 2   pilocarpine  (PILOCAR) 1 % ophthalmic solution, Place 1 drop into both eyes 2 (two) times daily., Disp: , Rfl:    polyethylene glycol (MIRALAX  / GLYCOLAX ) 17 g packet, Take 17 g by mouth as needed., Disp: , Rfl:    potassium chloride  SA (KLOR-CON  M) 20 MEQ tablet, TAKE 1 TABLET ONCE DAILY WITH FOOD, Disp: 90 tablet, Rfl: 0   pramipexole  (MIRAPEX ) 0.25 MG tablet, TAKE 1 TABLET 2-3 HOURS BEFORE BEDTIME, Disp: 90 tablet, Rfl: 0   roflumilast  (DALIRESP ) 500 MCG TABS tablet, TAKE 1 TABLET ONCE DAILY, Disp: 90 tablet, Rfl: 0   spironolactone  (ALDACTONE ) 25 MG tablet, Take 1 tablet (25 mg total) by mouth daily., Disp: 90 tablet, Rfl: 3   traZODone  (DESYREL ) 50 MG tablet, TAKE 3 TABLETS EVERY NIGHT AT BEDTIME., Disp: 270 tablet, Rfl: 0   vitamin C (ASCORBIC ACID ) 500 MG tablet, Take 500 mg by mouth daily with lunch., Disp: , Rfl:    Review of Systems Review of Systems  Constitutional:  Negative for chills and fever.  Respiratory:  Positive for shortness of breath.   Cardiovascular:  Negative for chest pain.  Gastrointestinal:  Negative for abdominal pain, nausea and vomiting.  Musculoskeletal:  Negative for myalgias.  Neurological:  Negative for dizziness, weakness and headaches.       Objective:    Vitals BP 126/78  Pulse 94   Temp 97.7 F (36.5 C)   Resp 18   Ht 5' 8 (1.727 m)   Wt 160 lb 4 oz (72.7 kg)   SpO2 96%   BMI 24.37 kg/m    Physical Examination Physical Exam Constitutional:      Appearance: Normal appearance.  She is not ill-appearing.  Cardiovascular:     Rate and Rhythm: Normal rate and regular rhythm.     Pulses: Normal pulses.     Heart sounds: No murmur heard.    No friction rub. No gallop.  Pulmonary:     Effort: Pulmonary effort is normal. No respiratory distress.     Breath sounds: No wheezing, rhonchi or rales.  Abdominal:     General: Bowel sounds are normal. There is no distension.     Palpations: Abdomen is soft.     Tenderness: There is no abdominal tenderness.  Musculoskeletal:     Right lower leg: No edema.     Left lower leg: No edema.  Skin:    General: Skin is warm and dry.     Findings: No rash.     Comments: The wound is now healing around the edges.  The wound is now maybe 0.5cm x 0.5cm in diameter without erythema or drainage.  Neurological:     Mental Status: She is alert.        Assessment & Plan:   Stage I pressure ulcer of right buttock I want her to continue the calmoseptine ointment and her wound seems to be better.  If it is not healed in the next 2-3 weeks, she needs to let us  know.    No follow-ups on file.   Selinda Fleeta Finger, MD

## 2024-10-05 ENCOUNTER — Other Ambulatory Visit: Payer: Self-pay | Admitting: Internal Medicine

## 2024-10-11 ENCOUNTER — Other Ambulatory Visit: Payer: Self-pay | Admitting: Internal Medicine

## 2024-10-13 ENCOUNTER — Other Ambulatory Visit: Payer: Self-pay | Admitting: Internal Medicine

## 2024-10-13 MED ORDER — OXYCODONE HCL 5 MG PO TABS: 5.0000 mg | ORAL_TABLET | Freq: Three times a day (TID) | ORAL | 0 refills | Status: DC | PRN

## 2024-10-28 ENCOUNTER — Other Ambulatory Visit: Payer: Self-pay | Admitting: Internal Medicine

## 2024-10-28 DIAGNOSIS — I471 Supraventricular tachycardia, unspecified: Secondary | ICD-10-CM

## 2024-11-02 ENCOUNTER — Other Ambulatory Visit: Payer: Self-pay | Admitting: Internal Medicine

## 2024-11-07 ENCOUNTER — Encounter: Payer: Self-pay | Admitting: Internal Medicine

## 2024-11-07 ENCOUNTER — Ambulatory Visit: Admitting: Internal Medicine

## 2024-11-07 VITALS — BP 126/78 | HR 97 | Temp 97.7°F | Resp 18 | Ht 68.0 in | Wt 159.5 lb

## 2024-11-07 DIAGNOSIS — L89300 Pressure ulcer of unspecified buttock, unstageable: Secondary | ICD-10-CM | POA: Diagnosis not present

## 2024-11-07 MED ORDER — SANTYL 250 UNIT/GM EX OINT
1.0000 | TOPICAL_OINTMENT | Freq: Every day | CUTANEOUS | 0 refills | Status: AC
Start: 2024-11-07 — End: ?

## 2024-11-07 NOTE — Progress Notes (Signed)
   Acute Office Visit  Subjective:     Patient ID: [name], female    DOB: 1941/10/08, 83 y.o.   MRN: [id number]  Chief Complaint  Patient presents with   Bed sore    Office visit    HPI Patient is in today for sore on her buttock for 3 months, her daughter who stay with her has been doing dressing but wound is slowly getting worse and is not healing. She says wound hurts. Per daughter, she sit most of the time.  Review of Systems  Skin:  Positive for rash.        Objective:    BP 126/78 (BP Location: Left Arm, Patient Position: Sitting, Cuff Size: Normal)   Pulse 97   Temp 97.7 F (36.5 C)   Resp 18   Ht 5' 8 (1.727 m)   Wt 159 lb 8 oz (72.3 kg)   SpO2 97%   BMI 24.25 kg/m    Physical Exam Constitutional:      Appearance: Normal appearance.  Skin:    Comments: unsteagable left cheek and right buttock stage 2 pressure ulcer  Neurological:     Mental Status: She is alert. Mental status is at baseline.     No results found for any visits on 11/07/24.      Assessment & Plan:   Problem List Items Addressed This Visit       Other   Pressure injury of buttock, unstageable (HCC) - Primary   I will arrange for home health people to come at home and apply Santyl cream, cover with calcium  alginate and OpSite dressing.  She also need to apply fur on the seat and try to ambulate.      Relevant Orders   Ambulatory referral to Home Health    No orders of the defined types were placed in this encounter.   No follow-ups on file.  Roetta Dare, MD

## 2024-11-07 NOTE — Assessment & Plan Note (Signed)
 I will arrange for home health people to come at home and apply Santyl cream, cover with calcium  alginate and OpSite dressing.  She also need to apply fur on the seat and try to ambulate.

## 2024-11-09 ENCOUNTER — Other Ambulatory Visit: Payer: Self-pay | Admitting: Internal Medicine

## 2024-11-09 DIAGNOSIS — L89312 Pressure ulcer of right buttock, stage 2: Secondary | ICD-10-CM | POA: Diagnosis not present

## 2024-11-09 DIAGNOSIS — I509 Heart failure, unspecified: Secondary | ICD-10-CM | POA: Diagnosis not present

## 2024-11-09 DIAGNOSIS — I110 Hypertensive heart disease with heart failure: Secondary | ICD-10-CM | POA: Diagnosis not present

## 2024-11-09 DIAGNOSIS — I471 Supraventricular tachycardia, unspecified: Secondary | ICD-10-CM

## 2024-11-09 DIAGNOSIS — I2510 Atherosclerotic heart disease of native coronary artery without angina pectoris: Secondary | ICD-10-CM | POA: Diagnosis not present

## 2024-11-24 ENCOUNTER — Other Ambulatory Visit: Payer: Self-pay | Admitting: Internal Medicine

## 2024-11-24 DIAGNOSIS — I471 Supraventricular tachycardia, unspecified: Secondary | ICD-10-CM

## 2024-11-28 ENCOUNTER — Ambulatory Visit: Admitting: Internal Medicine

## 2024-11-28 ENCOUNTER — Encounter: Payer: Self-pay | Admitting: Internal Medicine

## 2024-11-28 VITALS — BP 160/68 | HR 55 | Temp 97.6°F | Resp 16 | Ht 68.0 in | Wt 157.4 lb

## 2024-11-28 DIAGNOSIS — E039 Hypothyroidism, unspecified: Secondary | ICD-10-CM

## 2024-11-28 DIAGNOSIS — K529 Noninfective gastroenteritis and colitis, unspecified: Secondary | ICD-10-CM | POA: Insufficient documentation

## 2024-11-28 DIAGNOSIS — G894 Chronic pain syndrome: Secondary | ICD-10-CM

## 2024-11-28 MED ORDER — OXYCODONE HCL 5 MG PO TABS: 5.0000 mg | ORAL_TABLET | Freq: Three times a day (TID) | ORAL | 0 refills | Status: AC | PRN

## 2024-11-28 MED ORDER — PROMETHAZINE HCL 12.5 MG PO TABS: 12.5000 mg | ORAL_TABLET | Freq: Four times a day (QID) | ORAL | 0 refills | Status: AC | PRN

## 2024-11-28 MED ORDER — METOPROLOL TARTRATE 25 MG PO TABS: 12.5000 mg | ORAL_TABLET | Freq: Two times a day (BID) | ORAL | 3 refills | Status: AC

## 2024-11-28 NOTE — Assessment & Plan Note (Signed)
We will check her TFT's today.  She seems euthyroid.

## 2024-11-28 NOTE — Assessment & Plan Note (Signed)
 We will start her on promethazine  12.5mg  as needed and I want her to drink fluids.  She can take immodium AD as needed but she states she is now improving with the n/v/d.

## 2024-11-28 NOTE — Assessment & Plan Note (Signed)
 She seems to be stable on her oxycodone  IR 5mg  po q 8 hrs prn.  I reviewed her [place] CSR/PDMP and we will refill her meds for 3 months.

## 2024-11-28 NOTE — Progress Notes (Signed)
 Office Visit  Subjective   Patient ID: [name]   DOB: 1941-03-27   Age: 83 y.o.   MRN: [id number]   Chief Complaint Chief Complaint  Patient presents with   Follow-up    3 month follow up.    Abdominal Pain    Pt in today for vomitting.The patient reports vomiting, nausea, and diarrhea. The patient states she has not taken any medications for the symptoms.      History of Present Illness [name] is a 83 yo female who comes in today for a regular followup but she tells me that she is having some acute sickness.  She states 2 days ago she began having nausea, vomiting and diarrhea.  This has slowly improved where she has only had one episode of diarrhea today.  There is no mucus or blood in her diarrhea.  She is drinking fluids.  The patient denies any fevers, chills, abdominal pain or other problems.    [name] is a 83 yo female who returns today for followup of her chronic pain syndrome.  Since her last visit, there has been no change to her chronic pain.  This past year, we did switch her oxycodone  to Xtampza  9mg  BID due to the Xtampza  working better for her pain.  She was on 5mg  po every 8 hours.  However, her insurance would not cover the Xtampza  so we therefore placed her back on oxycodone  5mg  po every 8 hours as needed where she does take it every 8 hours.  Her pain remains controlled at this time.   This past year, she had worsening pain of her chronic right knee pain.  She did see Dr. Barbarann in ortho in 01/2023 where he noted she has had persistent knee patellar pain since her right knee arthroplasty done in [place] 2007 by Dr.Feder. She had 1 single injection of cortisone in 09/2022 that gave her some relief and Dr. Vernetta discussed with her that repetitive cortisone injections are not a good idea with risks of infection.   X-rays have shown persistent lateral osteophyte or lateral patellofemoral ligament calcification with small crack which looks like a small  avulsion.  She was told about this up to 4 years ago and it has been persistent.  She has had knee aspiration in the past which showed no evidence of infection.  Bone scan showed slight increased uptake around the patella.  Serial x-rays showed no evidence of patellar component loosening.  Her pain seems to be worse when she is on her knee more.  Dr. Barbarann discussed with her that he thinks that a patellar revision will unlikely give her pain relief.  This component appears to be well-fixed and he wants her to use a short knee immobilizer that she can wear intermittently to take some pressure off the knee.  She has previously tried a copper sleeve without resolution.  She has chronic pain due to low back pain as well as pain from her history of bilateral knee replacements and bilateral shoulders.   She is currently on Xtampza  ER 9mg  po TID but she uses this twice a day.  This seems to help control her pain.  We noted she had acute pain in 2022 where she was having worsening pain with weakness and fatigue.  I thought these symptoms were a combination from recovering from COVID-19 and depression.  She did improve at that time.  Due to her acute pain as described , I spoke to Dr. Fernand  in Pain Management around 08/2021 where we decided to hold her Xtampza  for a few weeks and put her on short acting oxycodone  5mg  po q 6 hrs prn.  I did restart her on celexa  and her depression improved at that time.  [name] surgery on 01/2020.  She was on gabapentin  200mg  BID for pain as well but is no longer on this. Her chronic pain effects her neck, shoulders, lower back, hips and her knees.  She has seen Dr. Fernand in pain management who recommended placing her on oxycodone  ER 10mg  po BID.  The patient could not afford this so we switched her to oxycodone  10mg  po q 6 hr prn which she does take 4 times per day.  She states she could not function with her day to day activites without her pain  medications.  She has been on MS contin  and oxycodone  ER in the past.  She tells me that this medication gives her fatigue and all she wants to do is sit down and do nothing and is not able to do alot of her ADL's.  She was previously on percocet 10/325mg  po q 6hr prn (which she took 4 times per day) before the MS contin  and was on this for years.  She was on long term benzos in the past but again we stopped her off of these.  The patient has seen seen Dr. Druscilla (orthopedics) in [place] and Dr. Onetha (neurosurgery).  She remains on cyclobenzaprine  as a muscle relaxant.  The patient had had left shoulder surgery in 2014 due to impingement syndrome and a chronic rotator cuff tear and labral tear where they performed a debridement and synvectomy.  This did not correct her shoulder but did reduce the amount of pain she has had in that arm/shoulder.  Her back pain has remained the same.  She also has chronic back pain as well as pain from her history of bilateral knee replacements and bilateral shoulders.   She continues to have pain in both of her knees when she gets up.  She is still using a cane and no longer uses a walker for longer distances.  The patient denies any falls.  She does use a cane to get around and has LIFELINE.  In regards to her chronic lower back pain, her pain mostly affects her when she stands up.  The pain is an intermittent dull aching she rates as severe at times but again improves with sitting and medications.  There is no pain radiation.  She had back surgery 2012 where her neurosurgeon noted she had worsening back pain with progressive break down of L1-L2 above the levels of her L2-L5 fusion in the past.  They did do surgery and removed some of her hardware.  She has tried PT and pool therapy which she states did not help.  Her pain medicine allows her to do her ADL's including cleaning her house and shopping.  The patient was tried on cymbalta in the past but this made her having nausea  and vomiting.  There is no loss of bowel/bladder function or new weakness or numbness. Her last dose of oxycodone  was this morning.  The patient is a 83 year old Caucasian/White female who returns for a regularly scheduled thyroid  check.  She did go for a thyroid  US  on 12/04/2014 and this showed an enlarged thyroid  with hetergenous thyroid  gland consistent with goit but no nodules were identified.  Since the  last thyroid  check, there has been no overall change in her status. She was on levothyroxine  oral tablet 50 mcg daily.   She claims to have no symptoms suggestive of thyroid  imbalance specifically denying fatigue, cold intolerance, heat intolerance, tremors, anxiety, unexplained weight changes, and dry skin, palpitations, diarrhea, constipation or other problems.      Past Medical History Past Medical History:  Diagnosis Date   Anemia    bld. transfusion post lumbar surgery- 2012   Anxiety    Arthralgia    NOS   CAD (coronary artery disease)    s/p CABG 2004; s/p DES to LM in 2010;  LHC 10/29/11: EF 50-55%, mild elevated filling pressures, no pulmonary HTN, LM 90% ISR, LAD and CFX occluded, S-RI occluded (old), S-OM3 ok and L-LAD ok, native nondominant RCA 95% -  med rx recommended ; Lexiscan  Myoview  7/13 at [facility]: demonstrated normal LV function, anterior attenuation and localized ischemia, inferior, basilar, mid section   Carotid artery disease    Carotid US  6/19:  RICA 1-39; LICA 40-59; R subclavian stenosis - Repeat 1 year. // Carotid US  05/2019: R 1-39; L 40-59; R vertebral with atypical antegrade flow; R subclavian stenosis >> repeat 1 year // Carotid US  7/21: Bilat 40-59; L subclavian stenosis // Carotid US  7/22: Bilat ICA 40-59; R subclavian stenosis // Carotid US  08/10/23: RICA 1-39, LICA 40-59, L subclavian stenosis   CHF (congestive heart failure) (HCC) 01/21/2020   Chronic diastolic heart failure (HCC)    Echo 9/10: EF 60-55%, grade 1 diastolic dysfunction   Chronic lower  back pain    Chronic respiratory failure (HCC)    Cirrhosis (HCC)    CKD (chronic kidney disease), stage III (HCC)    COPD (chronic obstructive pulmonary disease) (HCC)    Emphysema dxed by Dr. Rosamond in [place] based on PFTs per pt in 2006; placed on albuterol    Degenerative disc disease, lumbar    Depression    TAKES CELEXA   AND  (OFF- WELBUTRIN)   Diuretic-induced hypokalemia 10/08/2018   DVT of lower extremity (deep venous thrombosis) (HCC)    recurrent. bilateral (2 episodes)   Dyspnea    home o2 when needed   Dysrhythmia    afib with cabg   Exertional angina    Treated with Isosorbide , Ranexa , amlodipine ; intolerant to metoprolol    GERD (gastroesophageal reflux disease)    Gout    on daily RX (10/06/2018)   HLD (hyperlipidemia)    Hyperlipidemia    Hypertension    Hypothyroidism    L Subclavian Artery Stenosis    Carotid US  08/10/23: RICA 1-39, LICA 40-59, L subclavian stenosis   Obesity (BMI 30-39.9) 2009   BMI 33   On home oxygen  therapy    OSA (obstructive sleep apnea)    Osteoarthritis    all over; hands (10/06/2018)   Osteoporosis    Oxygen  dependent    4L at home as needed (10/06/2018)   PVD (peripheral vascular disease)    Restless leg syndrome    Vitamin D  deficiency      Allergies Allergies  Allergen Reactions   Ativan  [Lorazepam ] Other (See Comments)    Confusion    Pitavastatin Itching and Other (See Comments)    Reaction to Livalo   Ropinirole Nausea Only and Other (See Comments)    Makes legs jump, also   Zofran  [Ondansetron  Hcl] Nausea Only   Zolpidem Tartrate Anxiety and Other (See Comments)    CONFUSION    Zolpidem Tartrate Swelling  CONFUSION   Penicillins Rash and Other (See Comments)    Tolerated cephalosporins in past Has patient had a PCN reaction causing immediate rash, facial/tongue/throat swelling, SOB or lightheadedness with hypotension: Yes Has patient had a PCN reaction causing severe rash involving mucus membranes or skin  necrosis: No Has patient had a PCN reaction that required hospitalization: No Has patient had a PCN reaction occurring within the last 10 years: No If all of the above answers are NO, then may proceed with Cephalosporin use.      Medications  Current Outpatient Medications:    albuterol  (PROVENTIL ) (2.5 MG/3ML) 0.083% nebulizer solution, Take 2.5 mg by nebulization every 6 (six) hours as needed for wheezing or shortness of breath., Disp: , Rfl:    allopurinol  (ZYLOPRIM ) 100 MG tablet, Take 1 tablet (100 mg total) by mouth daily with breakfast., Disp: 90 tablet, Rfl: 0   amLODipine  (NORVASC ) 2.5 MG tablet, Take 1/2 tablet as needed for BP greater than 160., Disp: 30 tablet, Rfl: 1   ARIPiprazole  (ABILIFY ) 2 MG tablet, TAKE 1 TABLET BY MOUTH ONCE DAILY, Disp: 90 tablet, Rfl: 0   aspirin  EC 81 MG tablet, Take 81 mg by mouth daily with breakfast. Swallow whole., Disp: , Rfl:    atorvastatin  (LIPITOR) 20 MG tablet, Take 1 tablet (20 mg total) by mouth at bedtime., Disp: 90 tablet, Rfl: 0   citalopram  (CELEXA ) 10 MG tablet, TAKE 1 TABLET BY MOUTH DAILY., Disp: 90 tablet, Rfl: 0   cyclobenzaprine  (FLEXERIL ) 10 MG tablet, Take 1 tablet (10 mg total) by mouth 3 (three) times daily., Disp: 270 tablet, Rfl: 0   D3-1000 25 MCG (1000 UT) capsule, TAKE 1 CAPSULE BY MOUTH DAILY WITH SUPPER., Disp: 90 capsule, Rfl: 0   dapagliflozin  propanediol (FARXIGA ) 10 MG TABS tablet, Take 1 tablet (10 mg total) by mouth daily before breakfast., Disp: 90 tablet, Rfl: 3   Docusate Sodium  (DSS) 100 MG CAPS, Take 100 mg by mouth daily as needed (constipation)., Disp: , Rfl:    donepezil  (ARICEPT ) 10 MG tablet, TAKE 1 TABLET BY MOUTH AT BEDTIME., Disp: 30 tablet, Rfl: 0   feeding supplement (ENSURE ENLIVE / ENSURE PLUS) LIQD, Take 237 mLs by mouth 2 (two) times daily between meals., Disp: , Rfl:    ferrous sulfate  325 (65 FE) MG tablet, TAKE 1 TABLET BY MOUTH 2 TIMES DAILY, Disp: 180 tablet, Rfl: 0    Fluticasone -Umeclidin-Vilant (TRELEGY ELLIPTA ) 200-62.5-25 MCG/ACT AEPB, Inhale 200 mg into the lungs daily., Disp: , Rfl:    furosemide  (LASIX ) 80 MG tablet, TAKE 1 TABLET BY MOUTH DAILY., Disp: 90 tablet, Rfl: 3   Ipratropium-Albuterol  (COMBIVENT  RESPIMAT) 20-100 MCG/ACT AERS respimat, Inhale 1 puff into the lungs 3 (three) times daily as needed for wheezing or shortness of breath., Disp: , Rfl:    levothyroxine  (SYNTHROID ) 50 MCG tablet, TAKE 1 TABLET BY MOUTH DAILY BEFORE BREAKFAST., Disp: 90 tablet, Rfl: 0   Magnesium  Oxide -Mg Supplement 500 MG CAPS, TAKE 1 CAPSULE BY MOUTH DAILY, Disp: 90 capsule, Rfl: 0   Melatonin 10 MG TABS, Take 10 mg by mouth at bedtime. (Patient taking differently: Take 10 mg by mouth at bedtime. Taking 3 tablets), Disp: , Rfl:    memantine  (NAMENDA ) 5 MG tablet, TAKE 1 TABLET BY MOUTH 2 TIMES DAILY., Disp: 60 tablet, Rfl: 0   metoprolol  tartrate (LOPRESSOR ) 25 MG tablet, TAKE 1/2 TABLET BY MOUTH 2 TIMES DAILY., Disp: 15 tablet, Rfl: 0   Multiple Vitamin (MULTIVITAMIN WITH MINERALS) TABS tablet, Take  1 tablet by mouth daily., Disp: , Rfl:    nitroGLYCERIN  (NITROSTAT ) 0.4 MG SL tablet, Place 1 tablet (0.4 mg total) under the tongue every 5 (five) minutes as needed for chest pain. For chest pain, Disp: 25 tablet, Rfl: 3   oxyCODONE  (OXY IR/ROXICODONE ) 5 MG immediate release tablet, Take 1 tablet (5 mg total) by mouth every 8 (eight) hours as needed for severe pain (pain score 7-10)., Disp: 90 tablet, Rfl: 0   OXYGEN , Inhale 4 L/min into the lungs as needed (DURING ALL TIMES OF EXERTION). 3L at night when needed., Disp: , Rfl:    pantoprazole  (PROTONIX ) 40 MG tablet, TAKE 1 TABLET BY MOUTH DAILY., Disp: 90 tablet, Rfl: 2   pilocarpine  (PILOCAR) 1 % ophthalmic solution, Place 1 drop into both eyes 2 (two) times daily., Disp: , Rfl:    polyethylene glycol (MIRALAX  / GLYCOLAX ) 17 g packet, Take 17 g by mouth as needed., Disp: , Rfl:    potassium chloride  SA (KLOR-CON  M) 20 MEQ  tablet, TAKE 1 TABLET ONCE DAILY WITH FOOD, Disp: 90 tablet, Rfl: 0   pramipexole  (MIRAPEX ) 0.25 MG tablet, TAKE 1 TABLET 2-3 HOURS BEFORE BEDTIME, Disp: 90 tablet, Rfl: 0   roflumilast  (DALIRESP ) 500 MCG TABS tablet, TAKE 1 TABLET ONCE DAILY, Disp: 90 tablet, Rfl: 0   spironolactone  (ALDACTONE ) 25 MG tablet, Take 1 tablet (25 mg total) by mouth daily., Disp: 90 tablet, Rfl: 3   traZODone  (DESYREL ) 50 MG tablet, TAKE 3 TABLETS EVERY NIGHT AT BEDTIME., Disp: 270 tablet, Rfl: 0   vitamin C (ASCORBIC ACID ) 500 MG tablet, Take 500 mg by mouth daily with lunch., Disp: , Rfl:    collagenase  (SANTYL ) 250 UNIT/GM ointment, Apply 1 Application topically daily. (Patient not taking: Reported on 11/28/2024), Disp: 15 g, Rfl: 0   Review of Systems Review of Systems  Constitutional:  Negative for chills, fever and malaise/fatigue.  Eyes:  Negative for blurred vision and double vision.  Respiratory:  Negative for cough and shortness of breath.   Cardiovascular:  Negative for chest pain, palpitations and leg swelling.  Gastrointestinal:  Positive for diarrhea, nausea and vomiting. Negative for abdominal pain, blood in stool and constipation.  Genitourinary:  Negative for frequency.  Musculoskeletal:  Negative for myalgias.  Skin:  Negative for itching and rash.  Neurological:  Negative for dizziness, weakness and headaches.  Endo/Heme/Allergies:  Negative for polydipsia.       Objective:    Vitals BP (!) 160/68   Pulse (!) 55   Temp 97.6 F (36.4 C) (Temporal)   Resp 16   Ht 5' 8 (1.727 m)   Wt 157 lb 6.4 oz (71.4 kg)   SpO2 97%   BMI 23.93 kg/m    Physical Examination Physical Exam Constitutional:      Appearance: Normal appearance. She is not ill-appearing.  Cardiovascular:     Rate and Rhythm: Normal rate and regular rhythm.     Pulses: Normal pulses.     Heart sounds: No murmur heard.    No friction rub. No gallop.  Pulmonary:     Effort: Pulmonary effort is normal. No respiratory  distress.     Breath sounds: No wheezing, rhonchi or rales.  Abdominal:     General: Bowel sounds are normal. There is no distension.     Palpations: Abdomen is soft.     Tenderness: There is no abdominal tenderness.  Musculoskeletal:     Right lower leg: No edema.     Left lower  leg: No edema.  Skin:    General: Skin is warm and dry.     Findings: No rash.  Neurological:     General: No focal deficit present.     Mental Status: She is alert and oriented to person, place, and time.  Psychiatric:        Mood and Affect: Mood normal.        Behavior: Behavior normal.        Assessment & Plan:   Gastroenteritis We will start her on promethazine  12.5mg  as needed and I want her to drink fluids.  She can take immodium AD as needed but she states she is now improving with the n/v/d.  Hypothyroidism We will check her TFT's today.  She seems euthyroid.  Chronic pain She seems to be stable on her oxycodone  IR 5mg  po q 8 hrs prn.  I reviewed her [place] CSR/PDMP and we will refill her meds for 3 months.    Return in about 3 months (around 02/26/2025) for annual.   Brileigh Sevcik Van Eyk, MD

## 2024-11-30 ENCOUNTER — Ambulatory Visit: Admitting: Internal Medicine

## 2024-11-30 DIAGNOSIS — E039 Hypothyroidism, unspecified: Secondary | ICD-10-CM

## 2024-11-30 NOTE — Progress Notes (Signed)
 Nurse Visit Lab Draw TSH, Free T4

## 2024-12-01 LAB — T4, FREE: Free T4: 1.41 ng/dL (ref 0.82–1.77)

## 2024-12-01 LAB — TSH: TSH: 3.43 u[IU]/mL (ref 0.450–4.500)

## 2024-12-07 ENCOUNTER — Other Ambulatory Visit: Payer: Self-pay | Admitting: Internal Medicine

## 2024-12-26 ENCOUNTER — Ambulatory Visit

## 2024-12-27 DIAGNOSIS — I493 Ventricular premature depolarization: Secondary | ICD-10-CM | POA: Diagnosis not present

## 2024-12-27 DIAGNOSIS — Z9861 Coronary angioplasty status: Secondary | ICD-10-CM | POA: Diagnosis not present

## 2024-12-27 DIAGNOSIS — I351 Nonrheumatic aortic (valve) insufficiency: Secondary | ICD-10-CM | POA: Diagnosis not present

## 2024-12-27 DIAGNOSIS — I361 Nonrheumatic tricuspid (valve) insufficiency: Secondary | ICD-10-CM | POA: Diagnosis not present

## 2024-12-27 DIAGNOSIS — I4891 Unspecified atrial fibrillation: Secondary | ICD-10-CM | POA: Diagnosis not present

## 2024-12-27 DIAGNOSIS — I340 Nonrheumatic mitral (valve) insufficiency: Secondary | ICD-10-CM | POA: Diagnosis not present

## 2024-12-27 DIAGNOSIS — I25709 Atherosclerosis of coronary artery bypass graft(s), unspecified, with unspecified angina pectoris: Secondary | ICD-10-CM | POA: Diagnosis not present

## 2024-12-27 DIAGNOSIS — I342 Nonrheumatic mitral (valve) stenosis: Secondary | ICD-10-CM | POA: Diagnosis not present

## 2024-12-27 DIAGNOSIS — R9431 Abnormal electrocardiogram [ECG] [EKG]: Secondary | ICD-10-CM | POA: Diagnosis not present

## 2024-12-27 DIAGNOSIS — I4892 Unspecified atrial flutter: Secondary | ICD-10-CM | POA: Diagnosis not present

## 2024-12-27 DIAGNOSIS — I517 Cardiomegaly: Secondary | ICD-10-CM | POA: Diagnosis not present

## 2024-12-27 DIAGNOSIS — J9600 Acute respiratory failure, unspecified whether with hypoxia or hypercapnia: Secondary | ICD-10-CM | POA: Diagnosis not present

## 2024-12-27 DIAGNOSIS — I4510 Unspecified right bundle-branch block: Secondary | ICD-10-CM | POA: Diagnosis not present

## 2024-12-27 DIAGNOSIS — I440 Atrioventricular block, first degree: Secondary | ICD-10-CM | POA: Diagnosis not present

## 2024-12-28 DIAGNOSIS — I4892 Unspecified atrial flutter: Secondary | ICD-10-CM | POA: Diagnosis not present

## 2024-12-28 DIAGNOSIS — J9600 Acute respiratory failure, unspecified whether with hypoxia or hypercapnia: Secondary | ICD-10-CM | POA: Diagnosis not present

## 2024-12-28 DIAGNOSIS — I25709 Atherosclerosis of coronary artery bypass graft(s), unspecified, with unspecified angina pectoris: Secondary | ICD-10-CM | POA: Diagnosis not present

## 2024-12-28 DIAGNOSIS — I4891 Unspecified atrial fibrillation: Secondary | ICD-10-CM | POA: Diagnosis not present

## 2025-01-06 ENCOUNTER — Ambulatory Visit

## 2025-01-06 VITALS — BP 120/78 | HR 72 | Temp 97.6°F | Resp 18 | Ht 68.0 in | Wt 159.4 lb

## 2025-01-06 DIAGNOSIS — I872 Venous insufficiency (chronic) (peripheral): Secondary | ICD-10-CM

## 2025-01-06 DIAGNOSIS — J449 Chronic obstructive pulmonary disease, unspecified: Secondary | ICD-10-CM

## 2025-01-06 MED ORDER — TRIAMCINOLONE ACETONIDE 0.1 % EX CREA: 1.0000 | TOPICAL_CREAM | Freq: Two times a day (BID) | CUTANEOUS | 0 refills | Status: AC

## 2025-01-06 MED ORDER — TRELEGY ELLIPTA 200-62.5-25 MCG/ACT IN AEPB: 200.0000 mg | INHALATION_SPRAY | Freq: Every day | RESPIRATORY_TRACT | 3 refills | Status: AC
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
## 2025-01-11 ENCOUNTER — Other Ambulatory Visit: Payer: Self-pay | Admitting: Internal Medicine

## 2025-01-20 ENCOUNTER — Telehealth: Payer: Self-pay | Admitting: Internal Medicine

## 2025-01-20 NOTE — Telephone Encounter (Signed)
 Attempted to call [facility], unable to connect with number provided.  Called and spoke with patient's granddaughter Joesph who states she handles patient's medications. Explained we received a call from [facility] asking if patient needed to be on an ACE, ARB or ARNI.  Joesph states patient does not use [facility], she uses [facility] (it). She states patient has not had any change in symptoms since last office visit with Dr. Okey. She is unsure why pharmacy would call about this.  Informed Joesph I will forward this message to Dr. Okey for her to review.   Will follow-up if any new recommendations, otherwise just call our office if any new symptoms. Joesph verbalized understanding and expressed appreciation for follow-up.

## 2025-01-20 NOTE — Telephone Encounter (Signed)
 Pharmacy called to see if the pt needed a ACE, or Arb, or a Arni please advise

## 2025-01-20 NOTE — Telephone Encounter (Signed)
 Pt should keep on same meds     Keep up with BP at home  I would not recomm starting those other meds     Confirm pt has follow up in clinic later this spring with me

## 2025-01-23 NOTE — Telephone Encounter (Signed)
 Spoke with pt's grand-daughter who was still confused about what pharmacy it was that had originally made the call about the medications. Explained that it was [facility] and the pt's grand-daughter stated that they had only used this pharmacy in the past for the pt's COPD medications and that was it. Reviewed Dr. Okey' recommendations with the pt's grand-daughter and scheduled pt for 9 month f/u appt in May with Dr. Okey. Pt's grand-daughter verbalized understanding of plan and had no further questions or concerns at this time.

## 2025-01-25 ENCOUNTER — Ambulatory Visit

## 2025-01-25 VITALS — BP 122/74 | HR 84 | Temp 97.6°F | Resp 18 | Ht 68.0 in | Wt 159.4 lb

## 2025-01-25 DIAGNOSIS — M25562 Pain in left knee: Secondary | ICD-10-CM

## 2025-01-25 DIAGNOSIS — M1712 Unilateral primary osteoarthritis, left knee: Secondary | ICD-10-CM

## 2025-01-25 DIAGNOSIS — M150 Primary generalized (osteo)arthritis: Secondary | ICD-10-CM

## 2025-01-25 MED ORDER — ACETAMINOPHEN ER 650 MG PO TBCR: 650.0000 mg | EXTENDED_RELEASE_TABLET | Freq: Three times a day (TID) | ORAL | 0 refills | Status: AC | PRN

## 2025-01-25 MED ORDER — DICLOFENAC SODIUM 1 % EX GEL: CUTANEOUS | 0 refills | Status: AC

## 2025-01-26 NOTE — Assessment & Plan Note (Addendum)
 Diclofenac  (Voltaren ) topical gel - apply to left knee as directed Acetaminophen  (Tylenol  Arthritis) 650 mg PO TID PRN pain Rest and activity modification Avoid prolonged standing, squatting, and stairs Ice to knee 15-20 minutes up to 3 times daily as needed Consider knee support brace for stability

## 2025-01-26 NOTE — Assessment & Plan Note (Signed)
 Diclofenac  (Voltaren ) topical gel - apply to left knee as directed Acetaminophen  (Tylenol  Arthritis) 650 mg PO TID PRN pain

## 2025-01-26 NOTE — Progress Notes (Signed)
 Patient: [name] DOB: 03/15/1941 Age: 84 Date of Visit: 01/25/2025 Visit Type: Acute  S - Subjective Chief Complaint: Left knee pain. History of Present Illness: [name] is an 84 year old female presenting with acute onset left knee pain, localized to the anterior aspect of the knee. She reports pain severity of 8/10, worsened with walking, prolonged standing, and weight-bearing. She denies any recent trauma, falls, twisting injury, locking, catching, or stiffness. She has not attempted stair climbing or squatting due to pain. Rest provides partial relief. She denies associated swelling, redness, warmth, fever, or systemic symptoms. Past Medical History: Reviewed per chart Medications: Reviewed per chart Allergies: Reviewed per chart  Review of Systems  Constitutional: Denies fever, chills, weight loss Musculoskeletal: Positive for left knee pain; denies joint locking, instability, or stiffness Neurologic: Denies weakness, numbness, tingling Skin: Denies redness, rash, or skin changes Cardiovascular: Denies chest pain, edema Respiratory: Denies shortness of breath Gait: Reports discomfort with ambulation  O - Objective Vital Signs: BP: 122/74 mmHg Pulse: 84 bpm Temp: 97.44F Resp: 18/min SpO?: 97% on room air Weight: 159 lb 6 oz Height: 5'8 Physical Examination: General: Alert, oriented 3, no acute distress Musculoskeletal - Left Knee: Inspection: No visible deformity, erythema, or ecchymosis Palpation: Mild anterior knee tenderness; no effusion or warmth Range of Motion: Preserved but limited by pain with flexion Stability: No laxity appreciated Crepitus: Mild crepitus with movement Gait: Antalgic gait favoring left knee Neurovascular: Sensation intact; distal pulses palpable Skin: Warm, dry, intact  A - Assessment Acute left knee pain Suspected degenerative joint disease / osteoarthritis of left knee Pain with ambulation  P - Plan Medications /  Treatment: Diclofenac  (Voltaren ) topical gel - apply to left knee as directed Acetaminophen  (Tylenol  Arthritis) 650 mg PO TID PRN pain Non-Pharmacologic Management: Rest and activity modification Avoid prolonged standing, squatting, and stairs Ice to knee 15-20 minutes up to 3 times daily as needed Consider knee support brace for stability Patient Education: Reviewed likely degenerative etiology of knee pain Explained proper use of topical NSAIDs Advised to avoid exceeding recommended acetaminophen  dosing Discussed fall-prevention strategies Instructed to monitor for swelling, redness, fever, or worsening pain Follow-Up: Return if pain persists or worsens over the next 1-2 weeks  Jon Bihari, NP

## 2025-01-27 ENCOUNTER — Ambulatory Visit

## 2025-03-01 ENCOUNTER — Encounter: Admitting: Internal Medicine

## 2025-04-24 ENCOUNTER — Ambulatory Visit: Admitting: Internal Medicine
